# Patient Record
Sex: Male | Born: 1945 | Race: White | Hispanic: Yes | Smoking: Former smoker
Health system: Southern US, Community
[De-identification: ages and names within clinical notes are randomized; demographics above are authoritative.]

## PROBLEM LIST (undated history)

## (undated) ENCOUNTER — Emergency Department (HOSPITAL_COMMUNITY): Admission: EM | Payer: Medicare Other

## (undated) DIAGNOSIS — I340 Nonrheumatic mitral (valve) insufficiency: Secondary | ICD-10-CM

## (undated) DIAGNOSIS — R55 Syncope and collapse: Secondary | ICD-10-CM

## (undated) DIAGNOSIS — I509 Heart failure, unspecified: Secondary | ICD-10-CM

## (undated) DIAGNOSIS — T884XXA Failed or difficult intubation, initial encounter: Secondary | ICD-10-CM

## (undated) DIAGNOSIS — M109 Gout, unspecified: Secondary | ICD-10-CM

## (undated) DIAGNOSIS — I10 Essential (primary) hypertension: Secondary | ICD-10-CM

## (undated) DIAGNOSIS — D471 Chronic myeloproliferative disease: Secondary | ICD-10-CM

## (undated) DIAGNOSIS — F1910 Other psychoactive substance abuse, uncomplicated: Secondary | ICD-10-CM

## (undated) DIAGNOSIS — R0600 Dyspnea, unspecified: Secondary | ICD-10-CM

## (undated) DIAGNOSIS — J449 Chronic obstructive pulmonary disease, unspecified: Secondary | ICD-10-CM

## (undated) DIAGNOSIS — N289 Disorder of kidney and ureter, unspecified: Secondary | ICD-10-CM

## (undated) DIAGNOSIS — J189 Pneumonia, unspecified organism: Secondary | ICD-10-CM

## (undated) DIAGNOSIS — K219 Gastro-esophageal reflux disease without esophagitis: Secondary | ICD-10-CM

## (undated) DIAGNOSIS — D45 Polycythemia vera: Secondary | ICD-10-CM

## (undated) DIAGNOSIS — I429 Cardiomyopathy, unspecified: Secondary | ICD-10-CM

## (undated) DIAGNOSIS — K922 Gastrointestinal hemorrhage, unspecified: Secondary | ICD-10-CM

## (undated) DIAGNOSIS — D72829 Elevated white blood cell count, unspecified: Secondary | ICD-10-CM

## (undated) DIAGNOSIS — I4891 Unspecified atrial fibrillation: Secondary | ICD-10-CM

## (undated) DIAGNOSIS — E669 Obesity, unspecified: Secondary | ICD-10-CM

## (undated) DIAGNOSIS — K759 Inflammatory liver disease, unspecified: Secondary | ICD-10-CM

## (undated) DIAGNOSIS — Z8679 Personal history of other diseases of the circulatory system: Secondary | ICD-10-CM

## (undated) HISTORY — PX: CARDIOVERSION: SHX1299

## (undated) HISTORY — PX: COLONOSCOPY WITH PROPOFOL: SHX5780

## (undated) HISTORY — PX: POLYPECTOMY: SHX5525

## (undated) HISTORY — PX: COLONOSCOPY: SHX174

## (undated) HISTORY — PX: EXCISIONAL HEMORRHOIDECTOMY: SHX1541

## (undated) HISTORY — PX: MULTIPLE EXTRACTIONS WITH ALVEOLOPLASTY: SHX5342

## (undated) HISTORY — PX: CARDIAC ELECTROPHYSIOLOGY MAPPING AND ABLATION: SHX1292

## (undated) HISTORY — DX: Chronic myeloproliferative disease: D47.1

## (undated) HISTORY — PX: MITRAL VALVE REPAIR: CATH118311

## (undated) HISTORY — PX: RIGHT/LEFT HEART CATH AND CORONARY ANGIOGRAPHY: CATH118266

## (undated) HISTORY — PX: CARDIAC SURGERY: SHX584

## (undated) HISTORY — PX: CARDIAC CATHETERIZATION: SHX172

## (undated) HISTORY — PX: CORONARY STENT INTERVENTION: CATH118234

## (undated) HISTORY — PX: RIGHT HEART CATH: CATH118263

## (undated) HISTORY — PX: MULTIPLE TOOTH EXTRACTIONS: SHX2053

## (undated) HISTORY — DX: Essential (primary) hypertension: I10

## (undated) HISTORY — DX: Elevated white blood cell count, unspecified: D72.829

## (undated) HISTORY — PX: TEE WITHOUT CARDIOVERSION: SHX5443

## (undated) HISTORY — DX: Heart failure, unspecified: I50.9

## (undated) HISTORY — DX: Other psychoactive substance abuse, uncomplicated: F19.10

## (undated) HISTORY — PX: LOOP RECORDER IMPLANT: SHX5477

## (undated) HISTORY — DX: Failed or difficult intubation, initial encounter: T88.4XXA

## (undated) HISTORY — DX: Unspecified atrial fibrillation: I48.91

---

## 2010-08-20 HISTORY — PX: CARDIAC ELECTROPHYSIOLOGY MAPPING AND ABLATION: SHX1292

## 2010-09-07 ENCOUNTER — Inpatient Hospital Stay (HOSPITAL_COMMUNITY): Payer: Self-pay

## 2010-09-07 ENCOUNTER — Inpatient Hospital Stay (HOSPITAL_COMMUNITY)
Admission: EM | Admit: 2010-09-07 | Discharge: 2010-09-12 | DRG: 250 | Disposition: A | Discharge status: Home / Self Care | Payer: Self-pay | Attending: Infectious Disease | Admitting: Infectious Disease

## 2010-09-07 ENCOUNTER — Emergency Department (HOSPITAL_COMMUNITY): Payer: Self-pay

## 2010-09-07 DIAGNOSIS — F1010 Alcohol abuse, uncomplicated: Secondary | ICD-10-CM | POA: Diagnosis present

## 2010-09-07 DIAGNOSIS — I5043 Acute on chronic combined systolic (congestive) and diastolic (congestive) heart failure: Secondary | ICD-10-CM | POA: Diagnosis present

## 2010-09-07 DIAGNOSIS — Z7982 Long term (current) use of aspirin: Secondary | ICD-10-CM

## 2010-09-07 DIAGNOSIS — D72829 Elevated white blood cell count, unspecified: Secondary | ICD-10-CM | POA: Diagnosis present

## 2010-09-07 DIAGNOSIS — I428 Other cardiomyopathies: Secondary | ICD-10-CM | POA: Diagnosis present

## 2010-09-07 DIAGNOSIS — R0609 Other forms of dyspnea: Secondary | ICD-10-CM

## 2010-09-07 DIAGNOSIS — R000 Tachycardia, unspecified: Secondary | ICD-10-CM | POA: Diagnosis present

## 2010-09-07 DIAGNOSIS — R7309 Other abnormal glucose: Secondary | ICD-10-CM | POA: Diagnosis present

## 2010-09-07 DIAGNOSIS — R609 Edema, unspecified: Secondary | ICD-10-CM

## 2010-09-07 DIAGNOSIS — R0989 Other specified symptoms and signs involving the circulatory and respiratory systems: Secondary | ICD-10-CM

## 2010-09-07 DIAGNOSIS — I10 Essential (primary) hypertension: Secondary | ICD-10-CM | POA: Diagnosis present

## 2010-09-07 DIAGNOSIS — I509 Heart failure, unspecified: Secondary | ICD-10-CM | POA: Diagnosis present

## 2010-09-07 DIAGNOSIS — E669 Obesity, unspecified: Secondary | ICD-10-CM | POA: Diagnosis present

## 2010-09-07 DIAGNOSIS — F17200 Nicotine dependence, unspecified, uncomplicated: Secondary | ICD-10-CM | POA: Diagnosis present

## 2010-09-07 DIAGNOSIS — R52 Pain, unspecified: Secondary | ICD-10-CM

## 2010-09-07 DIAGNOSIS — I4892 Unspecified atrial flutter: Secondary | ICD-10-CM | POA: Diagnosis present

## 2010-09-07 DIAGNOSIS — I4891 Unspecified atrial fibrillation: Principal | ICD-10-CM | POA: Diagnosis present

## 2010-09-07 LAB — DIFFERENTIAL
Basophils Absolute: 0 10*3/uL (ref 0.0–0.1)
Lymphocytes Relative: 15 % (ref 12–46)
Lymphs Abs: 1.9 10*3/uL (ref 0.7–4.0)
Neutro Abs: 9.6 10*3/uL — ABNORMAL HIGH (ref 1.7–7.7)
Neutrophils Relative %: 76 % (ref 43–77)

## 2010-09-07 LAB — BASIC METABOLIC PANEL
CO2: 24 mEq/L (ref 19–32)
CO2: 21 mEq/L (ref 19–32)
Calcium: 8.7 mg/dL (ref 8.4–10.5)
Calcium: 7.6 mg/dL — ABNORMAL LOW (ref 8.4–10.5)
Creatinine, Ser: 0.99 mg/dL (ref 0.4–1.5)
Creatinine, Ser: 0.8 mg/dL (ref 0.4–1.5)
GFR calc Af Amer: >60 mL/min (ref >60)
GFR calc Af Amer: >60 mL/min (ref >60)
GFR calc non Af Amer: >60 mL/min (ref >60)
GFR calc non Af Amer: >60 mL/min (ref >60)
Glucose, Bld: 220 mg/dL — ABNORMAL HIGH (ref 70–99)
Sodium: 141 mEq/L (ref 135–145)
Sodium: 134 mEq/L — ABNORMAL LOW (ref 135–145)

## 2010-09-07 LAB — POCT CARDIAC MARKERS
CKMB, poc: <1 ng/mL — ABNORMAL LOW (ref 1.0–8.0)
Myoglobin, poc: 46.5 ng/mL (ref 12–200)
Troponin i, poc: <0.05 ng/mL (ref 0.00–0.09)

## 2010-09-07 LAB — ETHANOL: Alcohol, Ethyl (B): <5 mg/dL (ref 0–10)

## 2010-09-07 LAB — CARDIAC PANEL(CRET KIN+CKTOT+MB+TROPI)
Relative Index: INVALID (ref 0.0–2.5)
Total CK: 33 U/L (ref 7–232)
Troponin I: 0.04 ng/mL (ref 0.00–0.06)

## 2010-09-07 LAB — CBC
HCT: 55.5 % — ABNORMAL HIGH (ref 39.0–52.0)
Hemoglobin: 19.6 g/dL — ABNORMAL HIGH (ref 13.0–17.0)
MCV: 100 fL (ref 78.0–100.0)
RBC: 5.55 MIL/uL (ref 4.22–5.81)
RDW: 14.6 % (ref 11.5–15.5)
WBC: 12.7 10*3/uL — ABNORMAL HIGH (ref 4.0–10.5)

## 2010-09-07 LAB — URINALYSIS, ROUTINE W REFLEX MICROSCOPIC
Hgb urine dipstick: NEGATIVE
Specific Gravity, Urine: 1.031 — ABNORMAL HIGH (ref 1.005–1.030)
Urine Glucose, Fasting: 100 mg/dL — AB

## 2010-09-07 LAB — CK TOTAL AND CKMB (NOT AT ARMC)
CK, MB: 1.5 ng/mL (ref 0.3–4.0)
Total CK: 41 U/L (ref 7–232)

## 2010-09-07 LAB — GLUCOSE, CAPILLARY
Glucose-Capillary: 100 mg/dL — ABNORMAL HIGH (ref 70–99)
Glucose-Capillary: 85 mg/dL (ref 70–99)

## 2010-09-07 LAB — HEPATIC FUNCTION PANEL
Albumin: 2.8 g/dL — ABNORMAL LOW (ref 3.5–5.2)
Alkaline Phosphatase: 71 U/L (ref 39–117)
Bilirubin, Direct: 0.3 mg/dL (ref 0.0–0.3)
Indirect Bilirubin: 0.6 mg/dL (ref 0.3–0.9)
Total Bilirubin: 0.9 mg/dL (ref 0.3–1.2)

## 2010-09-07 LAB — COMPREHENSIVE METABOLIC PANEL
ALT: 48 U/L (ref 0–53)
Alkaline Phosphatase: 81 U/L (ref 39–117)
BUN: 11 mg/dL (ref 6–23)
CO2: 24 mEq/L (ref 19–32)
Chloride: 109 mEq/L (ref 96–112)
GFR calc non Af Amer: >60 mL/min (ref >60)
Glucose, Bld: 109 mg/dL — ABNORMAL HIGH (ref 70–99)
Potassium: 3.8 mEq/L (ref 3.5–5.1)
Sodium: 139 mEq/L (ref 135–145)
Total Bilirubin: 0.8 mg/dL (ref 0.3–1.2)

## 2010-09-07 LAB — MAGNESIUM: Magnesium: 1.8 mg/dL (ref 1.5–2.5)

## 2010-09-07 LAB — RAPID URINE DRUG SCREEN, HOSP PERFORMED
Amphetamines: NOT DETECTED
Barbiturates: NOT DETECTED
Benzodiazepines: NOT DETECTED
Opiates: POSITIVE — AB
Tetrahydrocannabinol: NOT DETECTED

## 2010-09-07 LAB — HEMOGLOBIN A1C: Mean Plasma Glucose: 120 mg/dL — ABNORMAL HIGH (ref <117)

## 2010-09-07 LAB — HIV ANTIBODY (ROUTINE TESTING W REFLEX): HIV: NONREACTIVE

## 2010-09-08 DIAGNOSIS — I4892 Unspecified atrial flutter: Secondary | ICD-10-CM

## 2010-09-08 DIAGNOSIS — R609 Edema, unspecified: Secondary | ICD-10-CM

## 2010-09-08 DIAGNOSIS — R0989 Other specified symptoms and signs involving the circulatory and respiratory systems: Secondary | ICD-10-CM

## 2010-09-08 DIAGNOSIS — R0609 Other forms of dyspnea: Secondary | ICD-10-CM

## 2010-09-08 LAB — HEPATITIS PANEL, ACUTE
Hep B C IgM: NEGATIVE
Hepatitis B Surface Ag: NEGATIVE

## 2010-09-08 LAB — BASIC METABOLIC PANEL
BUN: 9 mg/dL (ref 6–23)
Calcium: 7.8 mg/dL — ABNORMAL LOW (ref 8.4–10.5)
Chloride: 109 mEq/L (ref 96–112)
Creatinine, Ser: 0.71 mg/dL (ref 0.4–1.5)
GFR calc Af Amer: >60 mL/min (ref >60)

## 2010-09-08 LAB — CARDIAC PANEL(CRET KIN+CKTOT+MB+TROPI)
Relative Index: INVALID (ref 0.0–2.5)
Total CK: 40 U/L (ref 7–232)
Troponin I: 0.05 ng/mL (ref 0.00–0.06)

## 2010-09-08 LAB — GLUCOSE, CAPILLARY
Glucose-Capillary: 116 mg/dL — ABNORMAL HIGH (ref 70–99)
Glucose-Capillary: 111 mg/dL — ABNORMAL HIGH (ref 70–99)
Glucose-Capillary: 143 mg/dL — ABNORMAL HIGH (ref 70–99)

## 2010-09-08 LAB — HEPARIN LEVEL (UNFRACTIONATED): Heparin Unfractionated: 0.37 IU/mL (ref 0.30–0.70)

## 2010-09-08 LAB — CBC
MCH: 34.4 pg — ABNORMAL HIGH (ref 26.0–34.0)
MCHC: 33.9 g/dL (ref 30.0–36.0)
MCV: 101.6 fL — ABNORMAL HIGH (ref 78.0–100.0)
Platelets: 258 10*3/uL (ref 150–400)
RBC: 5 MIL/uL (ref 4.22–5.81)
RDW: 15 % (ref 11.5–15.5)

## 2010-09-08 LAB — LIPID PANEL
Cholesterol: 143 mg/dL (ref 0–200)
LDL Cholesterol: 98 mg/dL (ref 0–99)
Total CHOL/HDL Ratio: 4.2 RATIO

## 2010-09-09 DIAGNOSIS — R0989 Other specified symptoms and signs involving the circulatory and respiratory systems: Secondary | ICD-10-CM

## 2010-09-09 DIAGNOSIS — R0609 Other forms of dyspnea: Secondary | ICD-10-CM

## 2010-09-09 DIAGNOSIS — R609 Edema, unspecified: Secondary | ICD-10-CM

## 2010-09-09 DIAGNOSIS — I4891 Unspecified atrial fibrillation: Secondary | ICD-10-CM

## 2010-09-09 LAB — GLUCOSE, CAPILLARY
Glucose-Capillary: 135 mg/dL — ABNORMAL HIGH (ref 70–99)
Glucose-Capillary: 104 mg/dL — ABNORMAL HIGH (ref 70–99)
Glucose-Capillary: 136 mg/dL — ABNORMAL HIGH (ref 70–99)

## 2010-09-09 LAB — BASIC METABOLIC PANEL
BUN: 18 mg/dL (ref 6–23)
Calcium: 8.5 mg/dL (ref 8.4–10.5)
Creatinine, Ser: 1.08 mg/dL (ref 0.4–1.5)
GFR calc Af Amer: >60 mL/min (ref >60)
GFR calc non Af Amer: >60 mL/min (ref >60)

## 2010-09-09 LAB — CBC
MCH: 34.1 pg — ABNORMAL HIGH (ref 26.0–34.0)
MCHC: 33.6 g/dL (ref 30.0–36.0)
Platelets: 229 10*3/uL (ref 150–400)
RBC: 4.78 MIL/uL (ref 4.22–5.81)
WBC: 12 10*3/uL — ABNORMAL HIGH (ref 4.0–10.5)

## 2010-09-09 LAB — HEPARIN LEVEL (UNFRACTIONATED): Heparin Unfractionated: 0.12 IU/mL — ABNORMAL LOW (ref 0.30–0.70)

## 2010-09-10 LAB — CBC
HCT: 47.3 % (ref 39.0–52.0)
Hemoglobin: 15.8 g/dL (ref 13.0–17.0)
MCHC: 33.4 g/dL (ref 30.0–36.0)
WBC: 8.8 10*3/uL (ref 4.0–10.5)

## 2010-09-10 LAB — BASIC METABOLIC PANEL
CO2: 30 mEq/L (ref 19–32)
Calcium: 8.3 mg/dL — ABNORMAL LOW (ref 8.4–10.5)
GFR calc Af Amer: >60 mL/min (ref >60)
GFR calc non Af Amer: >60 mL/min (ref >60)
Potassium: 3.8 mEq/L (ref 3.5–5.1)
Sodium: 142 mEq/L (ref 135–145)

## 2010-09-10 LAB — GLUCOSE, CAPILLARY: Glucose-Capillary: 130 mg/dL — ABNORMAL HIGH (ref 70–99)

## 2010-09-11 LAB — GLUCOSE, CAPILLARY
Glucose-Capillary: 104 mg/dL — ABNORMAL HIGH (ref 70–99)
Glucose-Capillary: 101 mg/dL — ABNORMAL HIGH (ref 70–99)
Glucose-Capillary: 169 mg/dL — ABNORMAL HIGH (ref 70–99)

## 2010-09-11 LAB — BASIC METABOLIC PANEL
BUN: 15 mg/dL (ref 6–23)
CO2: 29 mEq/L (ref 19–32)
Glucose, Bld: 102 mg/dL — ABNORMAL HIGH (ref 70–99)
Potassium: 4 mEq/L (ref 3.5–5.1)
Sodium: 139 mEq/L (ref 135–145)

## 2010-09-11 LAB — CBC
HCT: 48.4 % (ref 39.0–52.0)
Hemoglobin: 16.4 g/dL (ref 13.0–17.0)
MCH: 34.2 pg — ABNORMAL HIGH (ref 26.0–34.0)
MCHC: 33.9 g/dL (ref 30.0–36.0)
MCV: 100.8 fL — ABNORMAL HIGH (ref 78.0–100.0)

## 2010-09-12 DIAGNOSIS — R609 Edema, unspecified: Secondary | ICD-10-CM

## 2010-09-12 DIAGNOSIS — R0609 Other forms of dyspnea: Secondary | ICD-10-CM

## 2010-09-12 DIAGNOSIS — R0989 Other specified symptoms and signs involving the circulatory and respiratory systems: Secondary | ICD-10-CM

## 2010-09-12 LAB — CBC
HCT: 46.6 % (ref 39.0–52.0)
Hemoglobin: 15.6 g/dL (ref 13.0–17.0)
MCH: 33.7 pg (ref 26.0–34.0)
MCV: 100.6 fL — ABNORMAL HIGH (ref 78.0–100.0)
RBC: 4.63 MIL/uL (ref 4.22–5.81)

## 2010-09-12 LAB — BASIC METABOLIC PANEL
CO2: 32 mEq/L (ref 19–32)
Chloride: 99 mEq/L (ref 96–112)
Creatinine, Ser: 1.17 mg/dL (ref 0.4–1.5)
GFR calc Af Amer: >60 mL/min (ref >60)
Glucose, Bld: 92 mg/dL (ref 70–99)

## 2010-09-12 LAB — GLUCOSE, CAPILLARY

## 2010-09-25 NOTE — Discharge Summary (Signed)
[name], [name] NO.:  [id number]  MEDICAL RECORD NO.:  [id number]           PATIENT TYPE:  I  LOCATION:  2010                         FACILITY:  [facility]  PHYSICIAN:  Acey Lav, MD  DATE OF BIRTH:  1945-09-02  DATE OF ADMISSION:  09/07/2010 DATE OF DISCHARGE:  09/12/2010                              DISCHARGE SUMMARY   DISCHARGE DIAGNOSES: 1. Recurrent atrial flutter/atrial fibrillation status post ablation     on September 11, 2010. 2. 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 3. History of tobacco abuse, quit a week ago. 4. Alcohol abuse. 5. Leucocytosis from stress demargination. 6. Polycythemia 7. Hypertension.  DISCHARGE MEDICATIONS WITH ACCURATE DOSES: 1. Amiodarone 200 mg, take 1 tablet by mouth twice daily for 2 weeks. 2. Pradaxa 150 mg, take 1 tablet by mouth twice daily. 3. Multivitamins, take 1 tablet by mouth once daily. 4. Coreg 12.5 mg, take 1 tablet by mouth twice daily. 5. Lisinopril 5 mg, take 1 tablet by mouth daily. 6. Furosemide 40 mg, take 1 tablet by mouth daily. He was given some samples of pradaxa by [name] and he qualified for getting free pradaxa supply for 1 month at the time of discaharge.  DISPOSITION AND FOLLOWUP:  The patient was discharged home in stable and improved condition from [facility] on September 12, 2010.  The patient has been scheduled to follow up with [name] on September 23, 2010.  The patient has also been scheduled to follow up with Dr. Threasa Beards on September 30, 2010, at 9:15 a.m.  At that time, he needs to be counseled on alcohol cessation.  His medication compliance needs to be reassesed during  that visit.  We also need to consult Jaynee Eagles to see if the patient qualifies for the [facility] Card because of financial difficulties.[name]  PROCEDURES PERFORMED: 1. Chest x-ray on September 07, 2010, which showed  streaky left     perihilar opacity, could represent prominent vessels given the area     of mild vascular congestion, but further evaluation is recommended     with PA and lateral chest x-ray. 2. Portable chest x-ray on September 07, 2010, which showed     cardiomegaly and probable pulmonary vascular congestion.  No focal     infiltrates. 3. Ultrasound aorta, which showed normal examination of aorta.  No     evidence of aneurysm.  Small right renal cysts. 4. Echocardiogram on September 07, 2010, which showed diffuse     hypokinesis and EF of 30-35%. 5. TEE on September 08, 2010, which was negative for left atrial     thrombus. 6. Unsuccessful DC cardioversion with return of early atrial flutter     on September 08, 2010. 7. Radiofrequency ablation on September 11, 2010.  ADMITTING HISTORY AND PHYSICAL:  A 65 year old man with no significant past medical history except for alcohol abuse who comes to the ER with chief complaint of worsening shortness of breath and leg swelling for 1 week.  The patient reports that he noticed getting short of  breath mostly with exertion for several weeks to months, but was worse for the past 1 week.  He denied any PND or orthopnea.  The patient also states that he noticed bilateral leg swelling for last 1 week, which was not getting better.  He denied any aggravating or relieving factors and denies any association with pain or redness.  The patient also denied any associated trauma, insect bites.  He also denied any chest pain, palpitations, dizziness, cough, fever, nausea, vomiting, diarrhea, dysuria, urgency, frequency.  In the ER, the patient was found to be in atrial flutter.  He was treated first with adenosine and then was started on Cardizem drip.  PHYSICAL EXAMINATION:  VITAL SIGNS:  Temperature 98.3, blood pressure 166/129, heart rate of 161, respiratory rate to 20, O2 sats 97% on room air. GENERAL:  Alert and oriented x3, in no acute distress,  well developed. HEENT:  Pupils equal, round, reactive to light.  Extraocular movements intact. NECK:  Supple.  No JVD, full range of motion. CVS:  Tachycardic.  No chest wall tenderness, no murmurs, rubs or gallops. GI:  Soft, nontender.  Positive bowel sounds.  No hepatosplenomegaly. EXTREMITIES:  2+/4 nonpitting edema to ankles bilaterally. LYMPHATICS:  No lymphadenopathy. MUSCULOSKELETAL:  Moving all 4 extremities. NEUROLOGIC:  Alert and oriented x3.  Cranial nerves II through XII intact.  Motor strength 5/5 bilaterally in all 4 extremities.  Sensation is intact to light touch.  LABORATORIES ON ADMISSION:  Sodium 141, potassium 3.9, chloride 106, bicarb 24, BUN 12, creatinine 0.99, glucose of 220.  White cell count 12.7, hemoglobin 19.6, hematocrit 55, platelets of 259.  BNP 177.  HOSPITAL COURSE BY PROBLEM: 1. Recurrent atrial flutter/atrial fibrillation.  The patient     presented with bilateral lower extremity swelling for 2 weeks.  He     was found to be in atrial flutter in the ER and was started on     Cardizem drip.  Cardiology was consulted and they recommended     anticoagulation with full dose of heparin given his CHADS2 score of     2 with congestive heart failure and hypertension.  The patient was     continued on the Cardizem drip but did not show any significant     improvement in his symptoms and continued to be in atrial flutter.     The next day, the patient was attempted DC cardioversion after he was     ruled out for atrial thrombus by TEE, but unfortunately the patient had early     return of atrial flutter with fibrillation.  The patient was observed for      one more day and underwent radiofrequency ablation of his atrial flutter     on Feb 23rd.  The patient was also started on amiodarone because he     was also noticed to have atrial fibrillation alternating with     atrial flutter.  The patient was discharged home on amiodarone and     Pradaxa a close  followup with cardiologist, [name].  The     patient was given samples of Pradaxa at that time of discharge. 2. Cardiomyopathy.  The patient had nonischemic cardiomyopathy, likely     related to alcohol versus tachycardia induced.  His ejection     fraction was 30-35% per echo on September 07, 2010.  The patient was     started on Lasix, Coreg and lisinopril, which he tolerated well. 3. Leukocytosis.  The patient when presented,  had an elevated white     count which was thought likely to be secondary to stress     demargination.  He was ruled for any focus of infection with     negative chest x-ray and clear urinalysis. 4. Polycythemia.  The patient had reactive polycythemia, most likely     secondary to tobacco abuse.  His H and H was stable throughout his     hospital stay. 5. Alcohol.  The patient had a formal Child psychotherapist consult for     alcohol cessation.  The patient was encouraged not to drink and was     explained that most likely his atrial flutter was secondary to     alcohol abuse.  DISCHARGE LABORATORIES AND VITALS:  His discharge vitals include temperature 97.5, pulse 72, respiratory rate 20, blood pressure 136/93, O2 sats 94% on room air.  His discharge labs; white cell count 10.7, hemoglobin 15.6, hematocrit 46.6, and platelets of 214.  His sodium was 138, potassium 3.7, chloride 99, bicarb 32, glucose 92, BUN 17, creatinine 1.17.    ______________________________ Elyse Jarvis, MD   ______________________________ Acey Lav, MD    MS/MEDQ  D:  09/15/2010  T:  09/16/2010  Job:  [id number]  cc:   Jackson Latino, MD Georga Hacking, M.D.  Electronically Signed by Elyse Jarvis MD on 09/18/2010 09:20:44 AM Electronically Signed by Paulette Blanch DAM MD on 09/24/2010 12:04:40 PM

## 2010-09-30 ENCOUNTER — Ambulatory Visit (INDEPENDENT_AMBULATORY_CARE_PROVIDER_SITE_OTHER): Payer: Self-pay | Admitting: Internal Medicine

## 2010-09-30 ENCOUNTER — Ambulatory Visit (HOSPITAL_COMMUNITY)
Admission: RE | Admit: 2010-09-30 | Discharge: 2010-09-30 | Disposition: A | Discharge status: Home / Self Care | Payer: Self-pay | Source: Ambulatory Visit | Attending: Internal Medicine | Admitting: Internal Medicine

## 2010-09-30 ENCOUNTER — Encounter: Payer: Self-pay | Admitting: Internal Medicine

## 2010-09-30 VITALS — BP 97/66 | HR 56 | Temp 97.0°F | Ht 66.0 in | Wt 199.2 lb

## 2010-09-30 DIAGNOSIS — I517 Cardiomegaly: Secondary | ICD-10-CM | POA: Insufficient documentation

## 2010-09-30 DIAGNOSIS — I10 Essential (primary) hypertension: Secondary | ICD-10-CM | POA: Insufficient documentation

## 2010-09-30 DIAGNOSIS — F1010 Alcohol abuse, uncomplicated: Secondary | ICD-10-CM

## 2010-09-30 DIAGNOSIS — F1020 Alcohol dependence, uncomplicated: Secondary | ICD-10-CM | POA: Insufficient documentation

## 2010-09-30 DIAGNOSIS — I498 Other specified cardiac arrhythmias: Secondary | ICD-10-CM | POA: Insufficient documentation

## 2010-09-30 DIAGNOSIS — I4891 Unspecified atrial fibrillation: Secondary | ICD-10-CM | POA: Insufficient documentation

## 2010-09-30 DIAGNOSIS — I426 Alcoholic cardiomyopathy: Secondary | ICD-10-CM

## 2010-09-30 DIAGNOSIS — R9431 Abnormal electrocardiogram [ECG] [EKG]: Secondary | ICD-10-CM | POA: Insufficient documentation

## 2010-09-30 DIAGNOSIS — IMO0002 Reserved for concepts with insufficient information to code with codable children: Secondary | ICD-10-CM

## 2010-09-30 DIAGNOSIS — I252 Old myocardial infarction: Secondary | ICD-10-CM | POA: Insufficient documentation

## 2010-09-30 NOTE — Assessment & Plan Note (Signed)
Lab Results  Component Value Date   NA 138 09/12/2010   K 3.7 09/12/2010   CL 99 09/12/2010   CO2 32 09/12/2010   BUN 17 09/12/2010   CREATININE 1.17 09/12/2010    BP Readings from Last 3 Encounters:  09/30/10 97/66    BP well controlled and at goal, no dizziness or CP, will continue current meds and check vitals at next visit.

## 2010-09-30 NOTE — Progress Notes (Signed)
  Subjective:    Patient ID: [name], male    DOB: 01-24-1946, 65 y.o.   MRN: [id number]  HPI Pt is a 65 yo male with PMH of ETOH abuse, HTN, CHF with EF 30-35% who came here for recent hospital discharge f/u for his A. Flutter with A. Fib s/p ablation and conversion. He was admitted to hospital 09/07/10 - 09/12/10 and has been doing well after discharge and last saw his cardiologist [name] on 09/23/2010 and he changed amiodarone to 200mg  daily from bid. He has no palpitation, CP, abdominal pain, melena or fever except mild SOB which is chronic. No smoking, alcohol or drug abuse after recent discharge.     Review of Systems Per HPI.  Current Outpatient Prescriptions  Medication Sig Dispense Refill  . carvedilol (COREG) 12.5 MG tablet Take 12.5 mg by mouth 2 (two) times daily with a meal.        . dabigatran (PRADAXA) 150 MG CAPS Take 150 mg by mouth every 12 (twelve) hours.        . furosemide (LASIX) 40 MG tablet Take 40 mg by mouth daily.        [name] lisinopril (PRINIVIL,ZESTRIL) 5 MG tablet Take 5 mg by mouth daily.        . Multiple Vitamin (MULTIVITAMIN) tablet Take 1 tablet by mouth daily.          Allergies Codeine  Past Medical History  Diagnosis Date  . Cardiomyopathy   . Substance abuse     alcohol  . Hypertension   . Atrial flutter      status post ablation on 09/11/2010  . CHF (congestive heart failure)     EF 35% with diffuse hypokinesis    History reviewed. No pertinent past surgical history.     Objective:   Physical Exam General: Vital signs reviewed and noted. Well-developed,well-nourished,in no acute distress; alert,appropriate and cooperative throughout examination. Head: normocephalic, atraumatic. Neck: No deformities, masses, or tenderness noted. Lungs: Normal respiratory effort. Clear to auscultation BL without crackles or wheezes.  Heart: RRR. S1 and S2 normal without gallop, murmur, or rubs.  Abdomen: BS normoactive. Soft, Nondistended,  non-tender.  No masses or organomegaly. Extremities: No pretibial edema.         Assessment & Plan:

## 2010-09-30 NOTE — Patient Instructions (Addendum)
Please call the Clinic or [name] office if you have palpitation, irregular heart rhythm or chest pain.  Please do not drink any alcohol again.  Please go to see Ms. Jaynee Eagles for orange card and any finacial help.

## 2010-09-30 NOTE — Assessment & Plan Note (Addendum)
Since discharge, he has never drink, also provided counseling for the risk of ETOH to his health including liver, heart damage, he fully understands and said will not drink again.

## 2010-09-30 NOTE — Assessment & Plan Note (Addendum)
His recent EF 30-35% with diffuse hypokinesis, likely due to ETOH abuse. Will continue lasix, lisinopril, coreg and anticoagulation. He will f/u by [name] next month.

## 2010-09-30 NOTE — Assessment & Plan Note (Addendum)
He has A. Flutter with A. Fib s/p ablation and conversion, which is likely due to ETOH abuse-associated cardiomyopathy,  now on coreg , lisinopril, pradax and amiodarone, his HR regular and EGK shows NSR.  Will continue current meds and he will stop amiodarone after he completes  his remaining amiodarone pills.  Asked him to call us or [name] office if palpitation or CP happens. He has stopped ETOH.

## 2010-10-01 NOTE — Consult Note (Signed)
NAMEPIERCEN, COVINO NO.:  [id number]  MEDICAL RECORD NO.:  [id number]           PATIENT TYPE:  I  LOCATION:  2919                         FACILITY:  [facility]  PHYSICIAN:  Doylene Canning. Ladona Ridgel, MD    DATE OF BIRTH:  08-25-1945  DATE OF CONSULTATION:  09/08/2010 DATE OF DISCHARGE:                                CONSULTATION   REQUESTING PHYSICIAN:  Georga Hacking, MD  INDICATION FOR CONSULTATION:  Evaluation of atrial flutter with a rapid ventricular response.  HISTORY OF PRESENT ILLNESS:  The patient is a 65 year old man who is admitted to the hospital with worsening heart failure symptoms and found to be in atrial flutter with a rapid ventricular response.  He has had difficult to control atrial flutter and has required multiple AV nodal blocking drugs.  The patient underwent DC cardioversion today x3 and had early return of atrial flutter.  Since then, he also has very clearly gone into atrial fibrillation.  He is referred now for additional evaluation.  The patient denies a history of syncope.  He denies chest pain.  He does have dyspnea with exertion.  PAST MEDICAL HISTORY:  Notable for LV dysfunction with an EF of 35 or 40%.  He also has a history of peripheral edema.  He denies chest pain or shortness of breath.  He does not feel palpitations when he is in atrial fibrillation.  FAMILY HISTORY:  Negative for premature coronary artery disease.  SOCIAL HISTORY:  The patient previously lived in [place] where he was a native.  He moved to [place] over 50 years ago and then has moved to [place] approximately 8 years ago.  He admits to history of alcohol abuse, drinking a fifth of alcohol every other day.  He smokes a pack of cigarettes a day and he has done so for 40 years.  He denies other substance abuse.  REVIEW OF SYSTEMS:  All systems were reviewed and negative except as noted in the HPI.  PHYSICAL EXAMINATION:  GENERAL:  He is a  pleasant, well-appearing middle- aged man, in no distress. VITAL SIGNS:  Blood pressure was 115/73, the pulse was 110 and irregular, the respirations were 18 and temperature was 98.  HEENT: Normocephalic and atraumatic.  Pupils equal and round.  Oropharynx is moist.  Sclerae anicteric. NECK:  No jugular distention.  There is no thyromegaly.  Trachea is midline.  The carotid are 2+ and symmetric. LUNGS:  Clear bilaterally to auscultation.  There are no wheezes, rales or rhonchi. ABDOMEN:  Soft, nontender and nondistended.  There is no organomegaly. EXTREMITIES:  Demonstrated no cyanosis, clubbing or edema.  The pulses were 2+ and symmetric. SKIN:  Demonstrated multiple healing excoriations particularly on the legs. NEUROLOGIC:  Alert and oriented x3.  Cranial nerves intact.  The EKG previously demonstrates atrial flutter.  His current telemetry strips demonstrate clear-cut atrial fib with an irregularly irregular ventricular response.  IMPRESSION: 1. Atrial flutter with a rapid ventricular response and status post     direct current cardioversion with early return of atrial flutter. 2. Clear-cut atrial fibrillation. 3. Dyslipidemia.  DISCUSSION:  I have discussed  treatment option with the patient.  He has LV dysfunction.  I suspect related to a tachycardia-induced cardiomyopathy.  He needs sinus rhythm.  Initially, I thought catheter ablation would be reasonable, but because he clearly has atrial fibrillation to go along with his atrial flutter, I think amiodarone initially would be appropriate.  If the patient's arrhythmias are controlled on amiodarone and he will continue these.  If his atrial flutter persists despite amiodarone and his fibrillation remains quiet, then catheter ablation would be an option.  We will follow the patient back up in several weeks.     Doylene Canning. Ladona Ridgel, MD     GWT/MEDQ  D:  09/08/2010  T:  09/09/2010  Job:  [id number]  cc:   Georga Hacking,  M.D.  Electronically Signed by Lewayne Bunting MD on 10/01/2010 04:37:20 PM
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
## 2010-10-09 NOTE — Op Note (Signed)
[name], DOO NO.:  [id number]  MEDICAL RECORD NO.:  [id number]           PATIENT TYPE:  LOCATION:                                 FACILITY:  PHYSICIAN:  Duke Salvia, MD, FACCDATE OF BIRTH:  [birth year]  DATE OF PROCEDURE:  09/11/2010 DATE OF DISCHARGE:                              OPERATIVE REPORT   PREOPERATIVE DIAGNOSIS:  Atrial flutter.  POSTOPERATIVE DIAGNOSIS:  Atrial fibrillation.  PROCEDURE:  Invasive electrophysiological study, arrhythmia mapping, catheter ablation, cardioversion.  Following obtaining informed consent, the patient was brought to the electrophysiology laboratory and placed on the fluoroscopic table in supine position.  After routine prep and drape, cardiac catheterization was performed with local anesthesia and conscious sedation.  We actually ended up using CPAP given the patient's somewhat apneic breathing patterns during mild sedation.  Following the procedure, the catheters removed.  The patient was transferred to the holding area for sheath removal.  CATHETER: 1. A 5-French quadripolar catheter was inserted via the femoral vein     to the AV junction. 2. A 6-French octapolar catheter was inserted via the right femoral     vein to the coronary sinus. 3. A 7-French dual decapolar catheter was inserted via the left     femoral vein to the tricuspid annulus. 4. A 8-French 8-mm deflectable tip ablation catheter was inserted via     the right femoral vein to mapping sites in the posterior septal     space.  Surface leads I, aVF, and V1 were monitored continuously throughout the procedure.  Following insertion of the catheters, the stimulation protocol included incremental atrial pacing.  RESULTS:  End-tidal surface cardiogram at basic intervals: Initial rhythm:  Atrial flutter; with atrial site AA interval of 230 msec; RR interval 674 msec; PR interval N/A; QRS duration 97 msec; QT interval N/M; AH N/A; HV  interval 40 msec. Final rhythm:  Atrial fibrillation:  RR interval 955 msec; QRS duration 95 msec; HV interval 38 msec.  AV nodal conduction and accessory pathway function could not be assessed as the patient went into atrial fibrillation and following cardioversion immediately reverted to atrial fibrillation.  We can only say that his AV nodal Wenckebach cycle length is below 600 msec.  A total of 3 minutes and 47 seconds of radiofrequency energy was applied across the cavotricuspid isthmus resulting initially in termination of the tachycardia and then bidirectional isthmus block.  It was during the last application of energy with an A1 and A2 interval of 148 msec that the patient went into atrial fibrillation from which he was not cardioverted.  We then measured a CVP and this was greater than 25.  The patient was initially cardioverted from his atrial fibrillation.  He immediately returned to atrial fibrillation.  I was going to give him ibutilide.  However, on changing sheaths we measured his CVP, this was greater than 25 cm of water.  It was elected at this point to reverse his sedation, allow him to wake up with plan for diuresis.  Upon arousal, the patient had no complaints of chest pain or shortness of breath.  Fluoroscopy time, total of 5  minutes of fluoroscopy time was utilized at 7.5 frames per second.  IMPRESSION: 1. Sinus function, not assessed. 2. Atrial function notable for sustained atrial flutter, previously     identified as counterclockwise, today evidence of clockwise     flutter, successful ablation across the cavotricuspid isthmus     followed by atrial fibrillation which could not be cardioverted. 3. Atrioventricular nodal function sufficient to allow 2:1 conduction     of his atrioventricular node at 150 and apparently higher, 4. Normal HV interval. 5. Accessory pathway not assessed. 6. Ventricular response to programed stimulation - none  assessed.  SUMMARY CONCLUSION:  The results of electrophysiological testing confirmed cavotricuspid isthmus-dependent flutter.  It had converted from counterclockwise to clockwise.  The catheter ablation across the isthmus successfully terminated the flutter and resulted in bidirectional isthmus block.  The patient then developed atrial fibrillation as noted.  Repeat cardioversion was anticipated following the infusion of ibutilide.  However, it was elected not to do this because of the patient's very high CVP.  We will plan to diurese the patient.  Repeat cardioversion will be deferred to [name].     Duke Salvia, MD, [facility]     SCK/MEDQ  D:  09/11/2010  T:  09/11/2010  Job:  [id number]  Electronically Signed by Sherryl Manges MD [facility] on 10/09/2010 08:33:38 AM

## 2010-10-29 ENCOUNTER — Ambulatory Visit (HOSPITAL_COMMUNITY)
Admission: RE | Admit: 2010-10-29 | Discharge: 2010-10-29 | Disposition: A | Discharge status: Home / Self Care | Payer: Self-pay | Source: Ambulatory Visit | Attending: Cardiology | Admitting: Cardiology

## 2010-10-29 ENCOUNTER — Encounter: Payer: Self-pay | Admitting: Internal Medicine

## 2010-11-19 ENCOUNTER — Encounter: Payer: Self-pay | Admitting: Internal Medicine

## 2010-12-25 ENCOUNTER — Ambulatory Visit (HOSPITAL_COMMUNITY)
Admission: RE | Admit: 2010-12-25 | Discharge: 2010-12-25 | Disposition: A | Discharge status: Home / Self Care | Payer: Self-pay | Source: Ambulatory Visit | Attending: Cardiology | Admitting: Cardiology

## 2010-12-25 DIAGNOSIS — I359 Nonrheumatic aortic valve disorder, unspecified: Secondary | ICD-10-CM | POA: Insufficient documentation

## 2010-12-25 DIAGNOSIS — F17200 Nicotine dependence, unspecified, uncomplicated: Secondary | ICD-10-CM | POA: Insufficient documentation

## 2010-12-25 DIAGNOSIS — I4891 Unspecified atrial fibrillation: Secondary | ICD-10-CM | POA: Insufficient documentation

## 2010-12-25 DIAGNOSIS — I379 Nonrheumatic pulmonary valve disorder, unspecified: Secondary | ICD-10-CM | POA: Insufficient documentation

## 2010-12-25 DIAGNOSIS — I079 Rheumatic tricuspid valve disease, unspecified: Secondary | ICD-10-CM | POA: Insufficient documentation

## 2012-08-30 ENCOUNTER — Encounter (HOSPITAL_COMMUNITY): Payer: Self-pay | Admitting: *Deleted

## 2012-08-30 ENCOUNTER — Other Ambulatory Visit: Payer: Self-pay

## 2012-08-30 ENCOUNTER — Emergency Department (HOSPITAL_COMMUNITY): Payer: Medicare Other

## 2012-08-30 ENCOUNTER — Inpatient Hospital Stay (HOSPITAL_COMMUNITY)
Admission: EM | Admit: 2012-08-30 | Discharge: 2012-09-01 | DRG: 308 | Disposition: A | Discharge status: Home / Self Care | Payer: Medicare Other | Attending: Internal Medicine | Admitting: Internal Medicine

## 2012-08-30 DIAGNOSIS — Z91199 Patient's noncompliance with other medical treatment and regimen due to unspecified reason: Secondary | ICD-10-CM

## 2012-08-30 DIAGNOSIS — Z87891 Personal history of nicotine dependence: Secondary | ICD-10-CM

## 2012-08-30 DIAGNOSIS — Z9119 Patient's noncompliance with other medical treatment and regimen: Secondary | ICD-10-CM

## 2012-08-30 DIAGNOSIS — D72829 Elevated white blood cell count, unspecified: Secondary | ICD-10-CM

## 2012-08-30 DIAGNOSIS — Z8249 Family history of ischemic heart disease and other diseases of the circulatory system: Secondary | ICD-10-CM

## 2012-08-30 DIAGNOSIS — F1010 Alcohol abuse, uncomplicated: Secondary | ICD-10-CM | POA: Diagnosis present

## 2012-08-30 DIAGNOSIS — Z79899 Other long term (current) drug therapy: Secondary | ICD-10-CM

## 2012-08-30 DIAGNOSIS — I5040 Unspecified combined systolic (congestive) and diastolic (congestive) heart failure: Secondary | ICD-10-CM | POA: Diagnosis present

## 2012-08-30 DIAGNOSIS — D45 Polycythemia vera: Secondary | ICD-10-CM | POA: Diagnosis present

## 2012-08-30 DIAGNOSIS — Z23 Encounter for immunization: Secondary | ICD-10-CM

## 2012-08-30 DIAGNOSIS — I5043 Acute on chronic combined systolic (congestive) and diastolic (congestive) heart failure: Secondary | ICD-10-CM | POA: Diagnosis present

## 2012-08-30 DIAGNOSIS — Z6379 Other stressful life events affecting family and household: Secondary | ICD-10-CM

## 2012-08-30 DIAGNOSIS — I10 Essential (primary) hypertension: Secondary | ICD-10-CM | POA: Diagnosis present

## 2012-08-30 DIAGNOSIS — Z7901 Long term (current) use of anticoagulants: Secondary | ICD-10-CM

## 2012-08-30 DIAGNOSIS — I428 Other cardiomyopathies: Secondary | ICD-10-CM

## 2012-08-30 DIAGNOSIS — I509 Heart failure, unspecified: Secondary | ICD-10-CM | POA: Diagnosis present

## 2012-08-30 DIAGNOSIS — I4891 Unspecified atrial fibrillation: Principal | ICD-10-CM | POA: Diagnosis present

## 2012-08-30 DIAGNOSIS — Z833 Family history of diabetes mellitus: Secondary | ICD-10-CM

## 2012-08-30 LAB — URINALYSIS, ROUTINE W REFLEX MICROSCOPIC
Glucose, UA: NEGATIVE mg/dL
Ketones, ur: 15 mg/dL — AB
Protein, ur: 100 mg/dL — AB

## 2012-08-30 LAB — COMPREHENSIVE METABOLIC PANEL
ALT: 33 U/L (ref 0–53)
AST: 29 U/L (ref 0–37)
Albumin: 2.9 g/dL — ABNORMAL LOW (ref 3.5–5.2)
Alkaline Phosphatase: 114 U/L (ref 39–117)
Glucose, Bld: 99 mg/dL (ref 70–99)
Potassium: 3.8 mEq/L (ref 3.5–5.1)
Sodium: 138 mEq/L (ref 135–145)
Total Protein: 6.6 g/dL (ref 6.0–8.3)

## 2012-08-30 LAB — CBC
HCT: 54.4 % — ABNORMAL HIGH (ref 39.0–52.0)
Hemoglobin: 18.7 g/dL — ABNORMAL HIGH (ref 13.0–17.0)
MCH: 33.2 pg (ref 26.0–34.0)
MCHC: 34.4 g/dL (ref 30.0–36.0)

## 2012-08-30 LAB — URINE MICROSCOPIC-ADD ON

## 2012-08-30 LAB — POCT I-STAT, CHEM 8
Chloride: 102 mEq/L (ref 96–112)
Glucose, Bld: 98 mg/dL (ref 70–99)
HCT: 59 % — ABNORMAL HIGH (ref 39.0–52.0)
Potassium: 3.8 mEq/L (ref 3.5–5.1)
Sodium: 142 mEq/L (ref 135–145)

## 2012-08-30 LAB — MAGNESIUM: Magnesium: 1.6 mg/dL (ref 1.5–2.5)

## 2012-08-30 LAB — POCT I-STAT TROPONIN I

## 2012-08-30 MED ORDER — ADULT MULTIVITAMIN W/MINERALS CH
1.0000 | ORAL_TABLET | Freq: Every day | ORAL | Status: DC
  Administered 2012-08-30 – 2012-09-01 (×3): 1 via ORAL
  Filled 2012-08-30 (×3): qty 1

## 2012-08-30 MED ORDER — SODIUM CHLORIDE 0.9 % IJ SOLN
3.0000 mL | Freq: Two times a day (BID) | INTRAMUSCULAR | Status: DC
  Administered 2012-09-01: 3 mL via INTRAVENOUS

## 2012-08-30 MED ORDER — RIVAROXABAN 20 MG PO TABS
20.0000 mg | ORAL_TABLET | Freq: Every day | ORAL | Status: DC
  Administered 2012-08-30 – 2012-08-31 (×2): 20 mg via ORAL
  Filled 2012-08-30 (×3): qty 1

## 2012-08-30 MED ORDER — IBUPROFEN 400 MG PO TABS
400.0000 mg | ORAL_TABLET | Freq: Four times a day (QID) | ORAL | Status: DC | PRN
  Filled 2012-08-30: qty 1

## 2012-08-30 MED ORDER — ENOXAPARIN SODIUM 40 MG/0.4ML ~~LOC~~ SOLN: 40.0000 mg | SUBCUTANEOUS | Status: DC

## 2012-08-30 MED ORDER — ONDANSETRON HCL 4 MG/2ML IJ SOLN: 4.0000 mg | Freq: Four times a day (QID) | INTRAMUSCULAR | Status: DC | PRN

## 2012-08-30 MED ORDER — VITAMIN B-1 100 MG PO TABS
100.0000 mg | ORAL_TABLET | Freq: Every day | ORAL | Status: DC
  Administered 2012-08-30 – 2012-09-01 (×3): 100 mg via ORAL
  Filled 2012-08-30 (×3): qty 1

## 2012-08-30 MED ORDER — DEXTROSE 5 % IV SOLN
5.0000 mg/h | INTRAVENOUS | Status: DC
  Administered 2012-08-30: 10 mg/h via INTRAVENOUS
  Administered 2012-08-30: 5 mg/h via INTRAVENOUS
  Administered 2012-08-31: 10 mg/h via INTRAVENOUS
  Filled 2012-08-30 (×3): qty 100

## 2012-08-30 MED ORDER — THIAMINE HCL 100 MG/ML IJ SOLN
100.0000 mg | Freq: Every day | INTRAMUSCULAR | Status: DC
  Filled 2012-08-30 (×3): qty 1

## 2012-08-30 MED ORDER — FUROSEMIDE 10 MG/ML IJ SOLN
40.0000 mg | Freq: Once | INTRAMUSCULAR | Status: AC
  Administered 2012-08-30: 40 mg via INTRAVENOUS
  Filled 2012-08-30: qty 4

## 2012-08-30 MED ORDER — DILTIAZEM LOAD VIA INFUSION
10.0000 mg | Freq: Once | INTRAVENOUS | Status: AC
  Administered 2012-08-30: 10 mg via INTRAVENOUS

## 2012-08-30 MED ORDER — LORAZEPAM 2 MG/ML IJ SOLN: 1.0000 mg | Freq: Four times a day (QID) | INTRAMUSCULAR | Status: DC | PRN

## 2012-08-30 MED ORDER — ONDANSETRON HCL 4 MG PO TABS: 4.0000 mg | ORAL_TABLET | Freq: Four times a day (QID) | ORAL | Status: DC | PRN

## 2012-08-30 MED ORDER — LORAZEPAM 0.5 MG PO TABS: 1.0000 mg | ORAL_TABLET | Freq: Four times a day (QID) | ORAL | Status: DC | PRN

## 2012-08-30 MED ORDER — FOLIC ACID 1 MG PO TABS
1.0000 mg | ORAL_TABLET | Freq: Every day | ORAL | Status: DC
  Administered 2012-08-30 – 2012-09-01 (×3): 1 mg via ORAL
  Filled 2012-08-30 (×3): qty 1

## 2012-08-30 NOTE — ED Notes (Signed)
Pt is c/o sob x 1 month, but today when he walked down the steps he couldn't breathe.  Per pt, he does not have hx of afib.  Presently afib with rvr of 176.

## 2012-08-30 NOTE — ED Notes (Signed)
Admit Doctor at bedside.  

## 2012-08-30 NOTE — H&P (Signed)
Hospital Admission Note Date: 08/30/2012  Patient name: [name] Medical record number: [id number] Date of birth: January 24, 1946 Age: 67 y.o. Gender: male PCP: Pcp Not In System  _________________________________________________________ INTERNAL MEDICINE TEACHING SERVICE CONTACT INFO     Weekday Hours (7AM-5PM): ** If no return call within 15 minutes (after trying both pagers listed below), please call after hours pagers.    First Contact:  Dr. Collier Bullock   Pager:  [phone] Second Contact:   Dr. Manson Passey   Pager:  [phone]      After Hours (after 5PM)/ Weekend / Holidays: First Contact:              Pager: [phone] Second Contact:         Pager: [phone] _________________________________________________________   Chief Complaint:  Shortness of breath  History of Present Illness:  Patient is a 67 year old gentleman with a history of hypertension, atrial fibrillation, alcohol and tobacco use, noncompliance with medications. He presents with worsening shortness of breath over the last month.  Patient states that his wife made him come in today because of the worsening shortness of breath on exertion. He also reports a cough with green sputum production the past 2-3 days. He denies having any chest pain, palpitations, changes in vision. No melena, no constipation or diarrhea. No nausea or vomiting, no abdominal pain. Denies fever or chills. He has not noticed any increased swelling in his legs.  Patient denies having any medical problems and has not seen a doctor in "years." He has not been taking any medications at home.  He admits to smoking ~4 cigarettes per day for years and binge drinking "about a 12-pack" every few weeks.  Family hx significant for HTN in mother, father died of cirrhosis   Meds:   Medication List     As of 08/30/2012  6:41 PM    Notice      You have not been prescribed any medications.         Allergies: Allergies as of 08/30/2012 - Review Complete  08/30/2012  Allergen Reaction Noted  . Codeine Rash 09/30/2010   Past Medical History  Diagnosis Date  . Cardiomyopathy   . Substance abuse     alcohol  . Hypertension   . Atrial flutter      status post ablation on 09/11/2010  . CHF (congestive heart failure)     EF 35% with diffuse hypokinesis   Past Surgical History  Procedure Laterality Date  . Hemorrhoid surgery     Family History  Problem Relation Age of Onset  . Diabetes Mother   . Hypertension Mother   . Cirrhosis Father   . Alcohol abuse Father    History   Social History  . Marital Status: Single    Spouse Name: N/A    Number of Children: N/A  . Years of Education: N/A   Occupational History  . Not on file.   Social History Main Topics  . Smoking status: Current Some Day Smoker -- 0.50 packs/day for 40 years    Types: Cigarettes  . Smokeless tobacco: Never Used  . Alcohol Use: 2.0 oz/week    4 drink(s) per week  . Drug Use: No     Comment: former coacaine user  . Sexually Active: Not on file   Other Topics Concern  . Not on file   Social History Narrative   Moved here from [place]. Lives with common law wife and grandchildren.    Review of Systems: Pertinent items noted in  HPI   Physical Exam Blood pressure 109/81, pulse 93, temperature 98 F (36.7 C), temperature source Oral, resp. rate 20, SpO2 98.00%. General:  No acute distress, alert and oriented x 3, sitting comfortably in bed HEENT:  PERRL, EOMI, moist mucous membranes Cardiovascular:  Irregularly irregular, rate approx 100, no murmurs  Respiratory:  Clear to auscultation bilaterally, no wheezes, rales, or rhonchi Abdomen:  Soft, distended, nontender, bowel sounds present, no organomegaly appreciated Extremities:  Warm and well-perfused, 1+edema.  Skin: Warm, dry, no rashes Neuro: Not anxious appearing, no depressed mood, normal affect  Lab results: Basic Metabolic Panel:  Recent Labs  16/10/96 1502 08/30/12 1516  08/30/12 1641  NA  --  142 138  K  --  3.8 3.8  CL  --  102 99  CO2  --   --  28  GLUCOSE  --  98 99  BUN  --  13 15  CREATININE  --  1.10 1.02  CALCIUM  --   --  8.4  MG 1.6  --   --   PHOS 3.9  --   --    CBC:  Recent Labs  08/30/12 1414 08/30/12 1516  WBC 18.4*  --   HGB 18.7* 20.1*  HCT 54.4* 59.0*  MCV 96.6  --   PLT 301  --    BNP:  Recent Labs  08/30/12 1642  PROBNP 5804.0*   Urine Drug Screen: Drugs of Abuse     Component Value Date/Time   LABOPIA POSITIVE* 09/07/2010 0910   COCAINSCRNUR NONE DETECTED 09/07/2010 0910   LABBENZ NONE DETECTED 09/07/2010 0910   AMPHETMU NONE DETECTED 09/07/2010 0910   THCU NONE DETECTED 09/07/2010 0910   LABBARB  Value: NONE DETECTED        DRUG SCREEN FOR MEDICAL PURPOSES ONLY.  IF CONFIRMATION IS NEEDED FOR ANY PURPOSE, NOTIFY LAB WITHIN 5 DAYS.        LOWEST DETECTABLE LIMITS FOR URINE DRUG SCREEN Drug Class       Cutoff (ng/mL) Amphetamine      1000 Barbiturate      200 Benzodiazepine   200 Tricyclics       300 Opiates          300 Cocaine          300 THC              50 09/07/2010 0910    Alcohol Level: No results found for this basename: ETH,  in the last 72 hours Urinalysis:  Recent Labs  08/30/12 1518  COLORURINE AMBER*  LABSPEC 1.027  PHURINE 5.0  GLUCOSEU NEGATIVE  HGBUR SMALL*  BILIRUBINUR LARGE*  KETONESUR 15*  PROTEINUR 100*  UROBILINOGEN 1.0  NITRITE POSITIVE*  LEUKOCYTESUR SMALL*     Imaging results:  Dg Chest Port 1 View  08/30/2012  *RADIOLOGY REPORT*  Clinical Data: Shortness of breath.  PORTABLE CHEST - 1 VIEW  Comparison: Two-view chest 09/07/2010.  Findings: Borderline cardiomegaly is present.  Mild pulmonary vascular congestion is evident.  There is an underlying component chronic interstitial coarsening.  No significant airspace consolidation is present.  The visualized soft tissues and bony thorax are unremarkable.  IMPRESSION:  1.  Borderline cardiomegaly and mild pulmonary vascular  congestion. 2.  A component of chronic interstitial coarsening is noted as well.   Original Report Authenticated By: [name], M.D.     Other results: EKG: Afib, rate ~160s  Assessment & Plan by Problem: Principal Problem:   Atrial fibrillation Active  Problems:   Idiopathic cardiomyopathy   Atrial Fibrillation with RVR Patient's HR elevated to 160s in ED, EKG showed AFib with RVR. Patient with history of the same s/p ablation and cardioversion with recurrent Afib, has not been on anticoagulation or other therapy for 2 years. Shortness of breath likely due to the paroxysmal atrial fibrillation. Last ECHO in June 2012 showed EF 55%, Gr 1 DD, concentric hypertrophy, and mild LA dilation. Patient was started on diltiazem drip in the ED and his heart rate came down to ~100, still in AFib.  -admit to telemetry bed -rate control with diltiazem drip for now -will start anticoagulation with Xarelto -repeat ECHO -BMP elevated to 5800, will diurese with 40u IV Lasix now and another dose tomorrow AM -appreciate cardiology recommendations  Idiopathic Cardiomyopathy ECHO results as above. Thought to be related to etoh vs tachycardia. Pt with SOB, elevated BMP, and edema noted on exam. Treat with lasix as above  Anion Gap  AG of 15 on admission with ketones in urine. Likely mild starvation ketoacidosis. Gap decreased to 11 in the ED. Patient without hyperglycemia, denies ingestion of methanol or toxic substances. -cont to monitor  Polycythemia Patient with chronic polycythemia, noted on previous admission. Hgb 18.7, Hct 54, RDW 16. Unclear etiology, possiblly related to smoking, sleep apnea, or COPD. Leukocytosis of 18 - ?UTI vs stress reaction  -EPO level -peripheral smear -CBC with diff tomorrow morning  HTN Not on any home meds BP 99-114/70s on admission. Monitor for now, diltiazem drip as above  Abnormal Urinalysis UA with Hgb, bili, ketones, 100 protein, nitrite pos, few  bacteria, mucus present, 3-6 wbc's Patient denies dysuria and has suprapubic tenderness on exam.  -will wait for culture results or treat if patient develops symptoms  Alcohol and tobacco abuse -cessation counseling -CIWA protocol, folic acid, thiamine, multivitamin  Health Maintenance -check Hgb A1c  DVT PPX -lovenox  Dispo -await    Signed: Denton Ar 08/30/2012, 6:41 PM

## 2012-08-30 NOTE — Consult Note (Signed)
Cardiology Consult Note  Admit date: 08/30/2012 Name: [name] 67 y.o.  male DOB:  July 31, 1945 MRN:  [id number]  Today's date:  08/30/2012  Referring Physician:    [facility]  Primary Physician:   None  Reason for Consultation:  Dyspnea and atrial fibrillation  IMPRESSIONS: 1. Acute on chronic dyspnea likely due to a multifactorial cause including atrial fibrillation, possible recent upper respiratory infection as well as congestive heart failure 2. Recurrent atrial fibrillation 3. History of cardiomyopathy with previous resolution but likely returned 4. Tobacco abuse of cigarettes 5. History of hypertension 6. Medical noncompliance  RECOMMENDATION: 1. Initiate anticoagulation with XARELTO which he has previously been on 2. Control of atrial fibrillation rate with intravenous diltiazem 3. Repeat echocardiogram 4. Obtained a BNP level as well as more complete lab testing including thyroid function and liver tests 5. Obtain urine screen for substance abuse 6. Diuresis if his BNP is elevated 7. I suspect that the atrial fibrillation has been paroxysmal as he had documented sinus rhythm on admission. We might start him on treatment with amiodarone but will defer this until he has been anticoagulated and also once we have seen what his echocardiogram shows.  HISTORY: This 67 year-old Hispanic male was seen by me around 2 years ago. He presented with rapid atrial flutter and fibrillation. Initially an attempt was made to do TEE cardioversion but he had recurrence of rapid atrial flutter following this. He had an ejection fraction of around 35%. He then underwent an atrial flutter ablation by Dr. 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 and later followed up in the office and was maintained on treatment with Pradaxa, lisinopril, Coreg and furosemide. He was started on treatment with amiodarone  because of recurrent atrial fibrillation. He had a followup echocardiogram done that showed interval improvement in his ejection fraction of 55% he stopped following up about 2 years ago. He has not had any medical treatment for the past 2 years.  About a month ago he had recurrent shortness of breath and describes worsening distant exertion. He did not have any chest discomfort suggestive of angina. He has had a cough and upper respiratory infection the past 2 days and came to the emergency room today with significant cough as well as shortness of breath. He was found to be initially in sinus rhythm with PACs but then had rapid atrial fibrillation and cardiology was consulted. He has been started on intravenous diltiazem and is currently improved.  Past Medical History  Diagnosis Date  . Cardiomyopathy   . Substance abuse     alcohol  . Hypertension   . Atrial flutter      status post ablation on 09/11/2010  . CHF (congestive heart failure)     EF 35% with diffuse hypokinesis      Past Surgical History  Procedure Laterality Date  . Hemorrhoid surgery      Allergies:  is allergic to codeine.   Medications:  None   Family History: Family Status  Relation Status Death Age  . Mother Alive   . Father Deceased 59    died of cirrhosis  . Sister Alive   . Sister Alive   . Sister Alive   . Sister Alive   . Brother Deceased     died of AIDS  . Brother Deceased     died of leukemia  . Brother Deceased     died  of GSW    Social History:   reports that he has been smoking Cigarettes.  He has a 20 pack-year smoking history. He has never used smokeless tobacco. He reports that he drinks about 2.0 ounces of alcohol per week. He reports that he does not use illicit drugs.   History   Social History Narrative   Moved here from [place]. Lives with common law wife and grandchildren.    Review of Systems: He has had some dyspnea and some cough but has not had any fever or  productive sputum. He has had some mild nocturia. No recurrent GI bleeding. Other than as noted above the remainder the remainder of the review of systems is unremarkable.  Physical Exam: BP 104/87  Pulse 90  Temp(Src) 98.5 F (36.9 C) (Oral)  Resp 20  SpO2 96%  General appearance: alert, cooperative, appears stated age and Mildly dyspneic Head: Normocephalic, without obvious abnormality, atraumatic Eyes: conjunctivae/corneas clear. PERRL, EOM's intact. Fundi benign. Throat: lips, mucosa, and tongue normal; teeth and gums normal Neck: no adenopathy, no carotid bruit, supple, symmetrical, trachea midline and Neck veins slightly distended Lungs: Bilateral wheezing and mild rales Heart: Irregular rhythm, normal S1-S2 no S3 no murmur Abdomen: soft, non-tender; bowel sounds normal; no masses,  no organomegaly Rectal: deferred Extremities: 2+ edema noted, no tenderness Pulses: 2+ and symmetric Skin: Skin color, texture, turgor normal. No rashes or lesions Neurologic: Grossly normal  Labs: CBC  Recent Labs  08/30/12 1414 08/30/12 1516  WBC 18.4*  --   RBC 5.63  --   HGB 18.7* 20.1*  HCT 54.4* 59.0*  PLT 301  --   MCV 96.6  --   MCH 33.2  --   MCHC 34.4  --   RDW 16.0*  --    CMP   Recent Labs  08/30/12 1516  NA 142  K 3.8  CL 102  GLUCOSE 98  BUN 13  CREATININE 1.10   Radiology: Cardiomegaly with interstitial congestion  EKG: Initial EKG shows sinus rhythm with significant PACs but he then developed rapid atrial fibrillation on a subsequent EKG  Signed:  W. Ashley Royalty MD [facility]   Cardiology Consultant  08/30/2012, 4:24 PM

## 2012-08-30 NOTE — ED Provider Notes (Signed)
History     CSN: [id number]  Arrival date & time 08/30/12  1317   First MD Initiated Contact with Patient 08/30/12 1346      Chief Complaint  Patient presents with  . Shortness of Breath    (Consider location/radiation/quality/duration/timing/severity/associated sxs/prior treatment) HPI Comments: 67 y.o PMH HTN, atrial fib/flutter s/p ablation and conversion, h/o alcohol abuse, tobacco abuse.  History of cardiomyopathy EF 30-35% thought to previously be due to alcohol.  Repeat echo 10/2011 EF 60-65%.  He presents for worsening sob x 1 month especially worse today when walking down the steps.    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 No PCP   Patient is a 67 y.o. male presenting with shortness of breath. The history is provided by the patient. No language interpreter was used.  Shortness of Breath Onset quality:  Gradual Duration:  1 month Progression:  Worsening Context: activity   Relieved by:  None tried Ineffective treatments:  None tried Associated symptoms: cough   Associated symptoms: no abdominal pain, no chest pain, no fever and no vomiting   Risk factors: tobacco use   Risk factors comment:  Denies etoh, drugs, +cigarette use   Past Medical History  Diagnosis Date  . Cardiomyopathy   . Substance abuse     alcohol  . Hypertension   . Atrial flutter      status post ablation on 09/11/2010  . CHF (congestive heart failure)     EF 35% with diffuse hypokinesis    History reviewed. No pertinent past surgical history.  Family History  Problem Relation Age of Onset  . Diabetes Mother   . Hypertension Mother   . Cirrhosis Father   . Alcohol abuse Father     History  Substance Use Topics  . Smoking status: Former Games developer  . Smokeless tobacco: Not on file  . Alcohol Use: No      Review of Systems  Constitutional: Negative  for fever.  Respiratory: Positive for cough and shortness of breath.        Cough dry to green sputum  Cardiovascular: Negative for chest pain and leg swelling.  Gastrointestinal: Negative for nausea, vomiting and abdominal pain.  Genitourinary: Negative for hematuria.  All other systems reviewed and are negative.    Allergies  Codeine  Home Medications   Current Outpatient Rx  Name  Route  Sig  Dispense  Refill  . carvedilol (COREG) 12.5 MG tablet   Oral   Take 12.5 mg by mouth 2 (two) times daily with a meal.           . dabigatran (PRADAXA) 150 MG CAPS   Oral   Take 150 mg by mouth every 12 (twelve) hours.           . furosemide (LASIX) 40 MG tablet   Oral   Take 40 mg by mouth daily.           [name] lisinopril (PRINIVIL,ZESTRIL) 5 MG tablet   Oral   Take 5 mg by mouth daily.           . Multiple Vitamin (MULTIVITAMIN) tablet   Oral   Take 1 tablet by mouth daily.             BP 105/72  Pulse 50  Temp(Src) 98.5 F (36.9 C) (Oral)  Resp 18  SpO2 96%  Physical Exam  Nursing note and vitals reviewed. Constitutional: He is oriented to person, place, and time. He appears well-developed and well-nourished. He is cooperative. No distress.  HENT:  Head: Normocephalic and atraumatic.  Mouth/Throat: Oropharynx is clear and moist and mucous membranes are normal. No oropharyngeal exudate.  Eyes: Conjunctivae are normal. Pupils are equal, round, and reactive to light. Right eye exhibits no discharge. Left eye exhibits no discharge. No scleral icterus.  Cardiovascular: S1 normal and S2 normal.  An irregularly irregular rhythm present.  No murmur heard. Afib with RVR 160s 1+edema b/l lower ext  Pulmonary/Chest: Effort normal and breath sounds normal. No respiratory distress. He has no wheezes.  Minimal bibasilar crackles  Abdominal: Soft. Bowel sounds are normal. He exhibits no distension. There is no tenderness.  obese  Neurological: He is alert and oriented to  person, place, and time. He has normal strength.  Skin: Skin is warm, dry and intact. No rash noted. He is not diaphoretic.  Psychiatric: He has a normal mood and affect. His speech is normal and behavior is normal. Judgment and thought content normal. Cognition and memory are normal.    ED Course  Procedures (including critical care time)  Labs Reviewed  CBC - Abnormal; Notable for the following:    WBC 18.4 (*)    Hemoglobin 18.7 (*)    HCT 54.4 (*)    RDW 16.0 (*)    All other components within normal limits  POCT I-STAT, CHEM 8 - Abnormal; Notable for the following:    Calcium, Ion 1.09 (*)    Hemoglobin 20.1 (*)    HCT 59.0 (*)    All other components within normal limits  URINALYSIS, ROUTINE W REFLEX MICROSCOPIC  MAGNESIUM  PHOSPHORUS  POCT I-STAT TROPONIN I   Dg Chest Port 1 View  08/30/2012  *RADIOLOGY REPORT*  Clinical Data: Shortness of breath.  PORTABLE CHEST - 1 VIEW  Comparison: Two-view chest 09/07/2010.  Findings: Borderline cardiomegaly is present.  Mild pulmonary vascular congestion is evident.  There is an underlying component chronic interstitial coarsening.  No significant airspace consolidation is present.  The visualized soft tissues and bony thorax are unremarkable.  IMPRESSION:  1.  Borderline cardiomegaly and mild pulmonary vascular congestion. 2.  A component of chronic interstitial coarsening is noted as well.   Original Report Authenticated By: [name], M.D.      1. Atrial fibrillation with RVR   2. Leukocytosis      Date: 08/30/2012  Rate: 168  Rhythm: atrial fibrillation  QRS Axis: normal  Intervals: normal  ST/T Wave abnormalities: normal  Conduction Disutrbances:none  Narrative Interpretation:   Old EKG Reviewed: changes noted    MDM  Atrial fibrillation with RVR Pt noncompliant with Coreg at home, not taking Pradaxa, never followed with cards EKG +Afib, having RVR 160s to 170s labs Dilt bolus and infusion [name] to  come see the patient but IMTS to admit   History of systolic HF EF 30% but improved with last echo EF 10/2011 60-65%  Shirlee Latch MD [phone]          Annett Gula, MD 08/30/12 1538  Annett Gula, MD 08/30/12 1551

## 2012-08-30 NOTE — ED Provider Notes (Addendum)
I saw and evaluated the patient, reviewed the resident's note and I agree with the findings and plan. Agree with EKG interpretation if present.    Pt with history of afib non-compliant with medications. Has had ablation before. Briefly converted to NSF but immediately back into Afib with RVR. Rate controlled on Cardizem drip. Admit for further eval.    CRITICAL CARE Performed by: Pollyann Savoy.   Total critical care time: 40  Critical care time was exclusive of separately billable procedures and treating other patients.  Critical care was necessary to treat or prevent imminent or life-threatening deterioration.  Critical care was time spent personally by me on the following activities: development of treatment plan with patient and/or surrogate as well as nursing, discussions with consultants, evaluation of patient's response to treatment, examination of patient, obtaining history from patient or surrogate, ordering and performing treatments and interventions, ordering and review of laboratory studies, ordering and review of radiographic studies, pulse oximetry and re-evaluation of patient's condition.   Charles B. Bernette Mayers, MD 08/30/12 [phone]

## 2012-08-30 NOTE — ED Notes (Signed)
Cardiology at bedside,  

## 2012-08-30 NOTE — ED Notes (Signed)
Pt states has had sob x 1 month got worse yesterday states it was hard to sleep last night sob on exertion has not seen a dr in a while upon arrival pt ekg  Had rapid heart beat

## 2012-08-31 DIAGNOSIS — I4891 Unspecified atrial fibrillation: Principal | ICD-10-CM

## 2012-08-31 DIAGNOSIS — I428 Other cardiomyopathies: Secondary | ICD-10-CM

## 2012-08-31 LAB — CBC WITH DIFFERENTIAL/PLATELET
Basophils Relative: 1 % (ref 0–1)
Eosinophils Absolute: 0.3 10*3/uL (ref 0.0–0.7)
Eosinophils Relative: 2 % (ref 0–5)
HCT: 53.1 % — ABNORMAL HIGH (ref 39.0–52.0)
Hemoglobin: 18.3 g/dL — ABNORMAL HIGH (ref 13.0–17.0)
MCH: 33.3 pg (ref 26.0–34.0)
MCHC: 34.5 g/dL (ref 30.0–36.0)
MCV: 96.5 fL (ref 78.0–100.0)
Monocytes Absolute: 1.2 10*3/uL — ABNORMAL HIGH (ref 0.1–1.0)
Monocytes Relative: 9 % (ref 3–12)
Neutrophils Relative %: 75 % (ref 43–77)

## 2012-08-31 LAB — BASIC METABOLIC PANEL
BUN: 16 mg/dL (ref 6–23)
Creatinine, Ser: 0.86 mg/dL (ref 0.50–1.35)
GFR calc Af Amer: >90 mL/min (ref >90)
GFR calc non Af Amer: 89 mL/min — ABNORMAL LOW (ref >90)

## 2012-08-31 LAB — PROTIME-INR: Prothrombin Time: 26.4 seconds — ABNORMAL HIGH (ref 11.6–15.2)

## 2012-08-31 LAB — HEPATIC FUNCTION PANEL
Bilirubin, Direct: 0.3 mg/dL (ref 0.0–0.3)
Indirect Bilirubin: 1.2 mg/dL — ABNORMAL HIGH (ref 0.3–0.9)
Total Bilirubin: 1.5 mg/dL — ABNORMAL HIGH (ref 0.3–1.2)

## 2012-08-31 LAB — URINE CULTURE
Colony Count: NO GROWTH
Culture: NO GROWTH

## 2012-08-31 LAB — HEMOGLOBIN A1C: Hgb A1c MFr Bld: 5.4 % (ref <5.7)

## 2012-08-31 MED ORDER — POTASSIUM CHLORIDE CRYS ER 20 MEQ PO TBCR
40.0000 meq | EXTENDED_RELEASE_TABLET | Freq: Once | ORAL | Status: AC
  Administered 2012-08-31: 40 meq via ORAL
  Filled 2012-08-31: qty 2

## 2012-08-31 MED ORDER — AMIODARONE HCL 200 MG PO TABS
200.0000 mg | ORAL_TABLET | Freq: Every day | ORAL | Status: DC
  Administered 2012-08-31 – 2012-09-01 (×2): 200 mg via ORAL
  Filled 2012-08-31 (×2): qty 1

## 2012-08-31 MED ORDER — DILTIAZEM HCL ER COATED BEADS 300 MG PO CP24
300.0000 mg | ORAL_CAPSULE | Freq: Every day | ORAL | Status: DC
  Administered 2012-08-31: 300 mg via ORAL
  Filled 2012-08-31: qty 1

## 2012-08-31 MED ORDER — FUROSEMIDE 10 MG/ML IJ SOLN
40.0000 mg | Freq: Once | INTRAMUSCULAR | Status: AC
  Administered 2012-08-31: 40 mg via INTRAVENOUS
  Filled 2012-08-31: qty 4

## 2012-08-31 MED ORDER — CARVEDILOL 3.125 MG PO TABS
3.1250 mg | ORAL_TABLET | Freq: Two times a day (BID) | ORAL | Status: DC
  Administered 2012-08-31 – 2012-09-01 (×2): 3.125 mg via ORAL
  Filled 2012-08-31 (×4): qty 1

## 2012-08-31 MED ORDER — LISINOPRIL 5 MG PO TABS
5.0000 mg | ORAL_TABLET | Freq: Every day | ORAL | Status: DC
  Administered 2012-08-31 – 2012-09-01 (×2): 5 mg via ORAL
  Filled 2012-08-31 (×2): qty 1

## 2012-08-31 NOTE — Progress Notes (Signed)
Utilization Review Completed [place] Loyal J. Baine Decesare, RN, BSN, NCM [phone]  

## 2012-08-31 NOTE — H&P (Signed)
Internal Medicine Attending Admission Note Date: 08/31/2012  Patient name: [name] Medical record number: [id number] Date of birth: 08-18-1945 Age: 67 y.o. Gender: male  I saw and evaluated the patient. I reviewed the resident's note and I agree with the resident's findings and plan as documented in the resident's note.  Chief Complaint(s): Shortness of breath x 1 month.  History - key components related to admission:  [name] is a 67 year old man with a history of hypertension, alcohol and tobacco abuse, and atrial fibrillation who has been noncompliant with his medications for years. He states he has gradually been getting more short of breath with increasing dyspnea on exertion over the last month. His wife has been getting on him about this and he finally decided to present for further evaluation. He denies any fevers, shakes, chills, nausea, vomiting, palpitations, or orthopnea. He does admit to paroxysmal nocturnal dyspnea. He is without other complaints at this time.  Physical Exam - key components related to admission:  Filed Vitals:   08/31/12 0522 08/31/12 0941 08/31/12 1400 08/31/12 1542  BP: 108/76 106/70 96/72 107/74  Pulse: 92 74 69 72  Temp: 97.9 F (36.6 C) 97.5 F (36.4 C) 97.6 F (36.4 C)   TempSrc: Oral Oral Oral   Resp: 20  20 18   Height:      Weight: 181 lb 10.5 oz (82.4 kg)     SpO2: 97% 94% 97% 94%   General: Well-developed, well-nourished man sitting comfortably in bed in no acute distress. Lungs: Clear to auscultation bilaterally without wheezes, rhonchi, or rales. Heart: Regular rate and rhythm without murmurs, rubs, gallops. He is not tachycardic and not irregular. Abdomen: Soft, nontender, active bowel sounds. Extremities: Without edema.  Lab results:  Basic Metabolic Panel:  Recent Labs  65/78/46 1502  08/30/12 1641 08/31/12 0545  NA  --   < > 138 140  K  --   < > 3.8 3.4*  CL  --   < > 99 99  CO2  --   --  28 29  GLUCOSE  --    < > 99 98  BUN  --   < > 15 16  CREATININE  --   < > 1.02 0.86  CALCIUM  --   --  8.4 8.4  MG 1.6  --   --   --   PHOS 3.9  --   --   --   < > = values in this interval not displayed. Liver Function Tests:  Recent Labs  08/30/12 1641 08/31/12 0545  AST 29 31  ALT 33 33  ALKPHOS 114 124*  BILITOT 1.8* 1.5*  PROT 6.6 6.5  ALBUMIN 2.9* 2.8*   CBC:  Recent Labs  08/30/12 1414 08/30/12 1516 08/31/12 0545  WBC 18.4*  --  12.8*  NEUTROABS  --   --  9.6*  HGB 18.7* 20.1* 18.3*  HCT 54.4* 59.0* 53.1*  MCV 96.6  --  96.5  PLT 301  --  263   Hemoglobin A1C:  Recent Labs  08/31/12 0545  HGBA1C 5.4   Thyroid Function Tests:  Recent Labs  08/30/12 1641  TSH 1.035   Coagulation:  Recent Labs  08/31/12 0545  INR 2.58*   Urinalysis:  Specific gravity 1.027, pH 5.0, small hemoglobin, large urine bilirubin, 15 ketones, 100 protein, positive nitrite, small leukocytes, 3-6 white blood cells per high-power field, 0-2 red blood cells per high-power field.  Misc. Labs:  Urine culture: No growth BNP 5804  Erythropoietin: Pending  Imaging results:  Dg Chest Port 1 View  08/30/2012  *RADIOLOGY REPORT*  Clinical Data: Shortness of breath.  PORTABLE CHEST - 1 VIEW  Comparison: Two-view chest 09/07/2010.  Findings: Borderline cardiomegaly is present.  Mild pulmonary vascular congestion is evident.  There is an underlying component chronic interstitial coarsening.  No significant airspace consolidation is present.  The visualized soft tissues and bony thorax are unremarkable.  IMPRESSION:  1.  Borderline cardiomegaly and mild pulmonary vascular congestion. 2.  A component of chronic interstitial coarsening is noted as well.   Original Report Authenticated By: [name], M.D.    Other results:  EKG: Atrial fibrillation at 123 beats per minute, normal axis and intervals, right atrial enlargement, left atrial enlargement, no LVH, no significant Q waves, nonspecific  ST-T changes. Compared to the EKG on 09/30/2010 the atrial fibrillation is new and the LVH on the prior ECG by voltage in aVL has resolved.  Assessment & Plan by Problem:  [name] is a 67 year old man with a history of hypertension, alcohol and tobacco abuse, and atrial fibrillation who presents with increasing shortness of breath x1 month. He was found to be in atrial fibrillation with rapid ventricular rate. He has known atrial fibrillation but hasn't been noncompliant with his medications for years. Interestingly, he reportedly was documented to be in normal sinus rhythm by a rhythm strip yesterday. Thus, his atrial fibrillation remains paroxysmal.  1) Atrial fibrillation with rapid ventricular rate: Resolved on a diltiazem drip. He is being converted to oral diltiazem 300 mg by mouth daily. He will also require anticoagulation and the decision was made to start Xarelto. Echocardiogram demonstrated left ventricular ejection fraction of 40-45% with some diastolic dysfunction. 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 We will confirm with telemetry, but on my examination this evening, he appears to be back in normal sinus rhythm. If this is the case we will start amiodarone in hopes of maintaining normal sinus rhythm.  2) Disposition: We anticipate he will be stable for discharge within the next 24-48 hours if we can maintain a controlled ventricular rate. Followup can be in the [facility] or other primary care physician of his choice.

## 2012-08-31 NOTE — Progress Notes (Signed)
  Echocardiogram 2D Echocardiogram has been performed.  Cathie Beams 08/31/2012, 9:18 AM

## 2012-08-31 NOTE — Progress Notes (Signed)
Subjective:  Breathing improved overnight.  No dyspnea today, no chest pain  Objective:  Vital Signs in the last 24 hours: BP 106/70  Pulse 74  Temp(Src) 97.5 F (36.4 C) (Oral)  Resp 20  Ht 5\' 6"  (1.676 m)  Wt 82.4 kg (181 lb 10.5 oz)  BMI 29.33 kg/m2  SpO2 94%  Physical Exam: Pleasant Hispanic male in NAD Lungs:  Clear  Cardiac:  iregular rhythm, normal S1 and S2, no S3 Abdomen:  Soft, nontender, no masses Extremities:  No edema present  Intake/Output from previous day: 02/11 0701 - 02/12 0700 In: 520.4 [P.O.:360; I.V.:160.4] Out: 2250 [Urine:2250] Weight Filed Weights   08/30/12 1932 08/31/12 0522  Weight: 82.7 kg (182 lb 5.1 oz) 82.4 kg (181 lb 10.5 oz)    Lab Results: Basic Metabolic Panel:  Recent Labs  40/98/11 1641 08/31/12 0545  NA 138 140  K 3.8 3.4*  CL 99 99  CO2 28 29  GLUCOSE 99 98  BUN 15 16  CREATININE 1.02 0.86    CBC:  Recent Labs  08/30/12 1414 08/30/12 1516 08/31/12 0545  WBC 18.4*  --  12.8*  NEUTROABS  --   --  9.6*  HGB 18.7* 20.1* 18.3*  HCT 54.4* 59.0* 53.1*  MCV 96.6  --  96.5  PLT 301  --  263    BNP    Component Value Date/Time   PROBNP 5804.0* 08/30/2012 1642    Telemetry: Atrial fibrillation rate controlled  ECHO Moderate concentric LVH mild global hypokinesis EF around 40-45%  Assessment/Plan:   1. Recurrent atrial fibrillation 2. 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 Noncompliance 4. Polycythemia  Rec:  He will need anticoagulation for atrial fibrillation.  He is currently on Xarelto.  Will need beta blocker, ACE.  He was in sinus rhythm yesterday when he was in the ER, so I think we can start amiodarone back for the time being.      Darden Palmer  MD [facility]  08/31/2012, 1:47 PM

## 2012-08-31 NOTE — Progress Notes (Signed)
Subjective: Patient states that he did well overnight. No issues, no chest pain, SOB.  Denies N/V.  Objective: Vital signs in last 24 hours: Filed Vitals:   08/30/12 1830 08/30/12 1932 08/31/12 0200 08/31/12 0522  BP: 109/81 110/70 106/78 108/76  Pulse: 93 93 90 92  Temp:  98.4 F (36.9 C)  97.9 F (36.6 C)  TempSrc:  Oral  Oral  Resp: 20 20  20   Height:  5\' 6"  (1.676 m)    Weight:  182 lb 5.1 oz (82.7 kg)  181 lb 10.5 oz (82.4 kg)  SpO2: 98% 97%  97%   Weight change:   Intake/Output Summary (Last 24 hours) at 08/31/12 0823 Last data filed at 08/31/12 0600  Gross per 24 hour  Intake 520.42 ml  Output   2250 ml  Net -1729.58 ml    Physical Exam Blood pressure 106/70, pulse 74, temperature 97.5 F (36.4 C), temperature source Oral, resp. rate 20, height 5\' 6"  (1.676 m), weight 181 lb 10.5 oz (82.4 kg), SpO2 94.00%. General:  No acute distress, alert and oriented x 3, well-appearing  HEENT:  PERRL, EOMI, moist mucous membranes Cardiovascular: Irregularly irregular rhythm, no murmurs Respiratory:  Clear to auscultation bilaterally, no wheezes, rales, or rhonchi Abdomen:  Soft, nondistended, nontender, bowel sounds present Extremities:  Warm and well-perfused, trace edema L>R, no calf tenderness, neg Homans' sign  Skin: Warm, dry, no rashes Neuro: Not anxious appearing, no depressed mood, normal affect  Lab Results: Basic Metabolic Panel:  Recent Labs Lab 08/30/12 1502  08/30/12 1641 08/31/12 0545  NA  --   < > 138 140  K  --   < > 3.8 3.4*  CL  --   < > 99 99  CO2  --   --  28 29  GLUCOSE  --   < > 99 98  BUN  --   < > 15 16  CREATININE  --   < > 1.02 0.86  CALCIUM  --   --  8.4 8.4  MG 1.6  --   --   --   PHOS 3.9  --   --   --   < > = values in this interval not displayed. Liver Function Tests:  Recent Labs Lab 08/30/12 1641 08/31/12 0545  AST 29 31  ALT 33 33  ALKPHOS 114 124*  BILITOT 1.8* 1.5*  PROT 6.6 6.5  ALBUMIN 2.9* 2.8*   CBC:  Recent  Labs Lab 08/30/12 1414 08/30/12 1516 08/31/12 0545  WBC 18.4*  --  12.8*  NEUTROABS  --   --  9.6*  HGB 18.7* 20.1* 18.3*  HCT 54.4* 59.0* 53.1*  MCV 96.6  --  96.5  PLT 301  --  263   BNP:  Recent Labs Lab 08/30/12 1642  PROBNP 5804.0*   Thyroid Function Tests:  Recent Labs Lab 08/30/12 1641  TSH 1.035   Coagulation:  Recent Labs Lab 08/31/12 0545  LABPROT 26.4*  INR 2.58*   Urine Drug Screen: Drugs of Abuse     Component Value Date/Time   LABOPIA POSITIVE* 09/07/2010 0910   COCAINSCRNUR NONE DETECTED 09/07/2010 0910   LABBENZ NONE DETECTED 09/07/2010 0910   AMPHETMU NONE DETECTED 09/07/2010 0910   THCU NONE DETECTED 09/07/2010 0910   LABBARB  Value: NONE DETECTED        DRUG SCREEN FOR MEDICAL PURPOSES ONLY.  IF CONFIRMATION IS NEEDED FOR ANY PURPOSE, NOTIFY LAB WITHIN 5 DAYS.  LOWEST DETECTABLE LIMITS FOR URINE DRUG SCREEN Drug Class       Cutoff (ng/mL) Amphetamine      1000 Barbiturate      200 Benzodiazepine   200 Tricyclics       300 Opiates          300 Cocaine          300 THC              50 09/07/2010 0910    Urinalysis:  Recent Labs Lab 08/30/12 1518  COLORURINE AMBER*  LABSPEC 1.027  PHURINE 5.0  GLUCOSEU NEGATIVE  HGBUR SMALL*  BILIRUBINUR LARGE*  KETONESUR 15*  PROTEINUR 100*  UROBILINOGEN 1.0  NITRITE POSITIVE*  LEUKOCYTESUR SMALL*    Studies/Results: Dg Chest Port 1 View  08/30/2012  *RADIOLOGY REPORT*  Clinical Data: Shortness of breath.  PORTABLE CHEST - 1 VIEW  Comparison: Two-view chest 09/07/2010.  Findings: Borderline cardiomegaly is present.  Mild pulmonary vascular congestion is evident.  There is an underlying component chronic interstitial coarsening.  No significant airspace consolidation is present.  The visualized soft tissues and bony thorax are unremarkable.  IMPRESSION:  1.  Borderline cardiomegaly and mild pulmonary vascular congestion. 2.  A component of chronic interstitial coarsening is noted as well.   Original  Report Authenticated By: [name], M.D.    Medications:  Medications reviewed  Scheduled Meds: . folic acid  1 mg Oral Daily  . multivitamin with minerals  1 tablet Oral Daily  . rivaroxaban  20 mg Oral Q supper  . sodium chloride  3 mL Intravenous Q12H  . thiamine  100 mg Oral Daily   Or  . thiamine  100 mg Intravenous Daily   Continuous Infusions: . diltiazem (CARDIZEM) infusion 10 mg/hr (08/30/12 2222)   PRN Meds:.ibuprofen, LORazepam, LORazepam, ondansetron (ZOFRAN) IV, ondansetron  Assessment/Plan:  Patient is a 67 year old gentleman with a history of hypertension, atrial fibrillation, alcohol and tobacco use, noncompliance with medications. He presents with worsening shortness of breath over the last month.  Atrial Fibrillation with RVR  Patient's HR elevated to 160s in ED, EKG showed AFib with RVR. Patient with history of the same s/p ablation and cardioversion with recurrent Afib, has not been on anticoagulation or other therapy for 2 years. Shortness of breath likely due to the paroxysmal atrial fibrillation. Last ECHO in June 2012 showed EF 55%, Gr 1 DD, concentric hypertrophy, and mild LA dilation.  Patient was started on diltiazem drip in the ED and heart rate came down to ~100, still in AFib.    -convert drip to PO diltiazem, 300mg  daily -cont anticoagulation with Xarelto  -ECHO read pending  -lasix 40mg  IV today, then reevaluate. Replete K as needed -appreciate cardiology recommendations   Idiopathic Cardiomyopathy  ECHO results as above. Thought to be related to etoh vs tachycardia. Pt with SOB, elevated BMP, and edema noted on exam. Treat with lasix as above. Repeat ECHO pending  Anion Gap  AG of 15 on admission with ketones in urine. Likely mild starvation ketoacidosis vs alcoholic ketoacidosis. Gap decreased to 11 in the ED. Patient without hyperglycemia, denies ingestion of methanol or toxic substances.  AG 12 today -cont to monitor    Polycythemia  Patient with chronic polycythemia, noted on previous admission. Hgb 18.7, Hct 54, RDW 16. Unclear etiology, possiblly related to smoking, sleep apnea, or COPD. Leukocytosis of 18 - ?UTI vs stress reaction - resolving, WBC 12.8 today  -EPO level pending -peripheral smear   HTN  Not on any home meds  BP 99-114/70s on admission.  Monitor for now, diltiazem PO as above   Abnormal Urinalysis  UA with Hgb, bili, ketones, 100 protein, nitrite pos, few bacteria, mucus present, 3-6 wbc's  Patient denies dysuria and has suprapubic tenderness on exam.  -will wait for culture results or treat if patient develops symptoms   Alcohol and tobacco abuse  -cessation counseling  -CIWA protocol, folic acid, thiamine, multivitamin   Health Maintenance  -check Hgb A1c   DVT PPX  -lovenox   Dispo  -anticipate dc in 1-2 days -will need [facility] f/u     LOS: 1 day   [place] Ar 08/31/2012, 8:23 AM

## 2012-09-01 LAB — BASIC METABOLIC PANEL
BUN: 16 mg/dL (ref 6–23)
Chloride: 99 mEq/L (ref 96–112)
GFR calc Af Amer: >90 mL/min (ref >90)
GFR calc non Af Amer: 89 mL/min — ABNORMAL LOW (ref >90)
Glucose, Bld: 98 mg/dL (ref 70–99)
Potassium: 3.5 mEq/L (ref 3.5–5.1)
Sodium: 138 mEq/L (ref 135–145)

## 2012-09-01 LAB — CBC
HCT: 52.5 % — ABNORMAL HIGH (ref 39.0–52.0)
Hemoglobin: 18.5 g/dL — ABNORMAL HIGH (ref 13.0–17.0)
MCH: 33.8 pg (ref 26.0–34.0)
MCHC: 35.2 g/dL (ref 30.0–36.0)
RBC: 5.47 MIL/uL (ref 4.22–5.81)

## 2012-09-01 MED ORDER — AMIODARONE HCL 200 MG PO TABS: 200.0000 mg | ORAL_TABLET | Freq: Every day | ORAL | Status: DC

## 2012-09-01 MED ORDER — RIVAROXABAN 20 MG PO TABS: 20.0000 mg | ORAL_TABLET | Freq: Every day | ORAL | Status: DC

## 2012-09-01 MED ORDER — LISINOPRIL 5 MG PO TABS: 5.0000 mg | ORAL_TABLET | Freq: Every day | ORAL | Status: DC

## 2012-09-01 MED ORDER — CARVEDILOL 3.125 MG PO TABS: 3.1250 mg | ORAL_TABLET | Freq: Two times a day (BID) | ORAL | Status: DC

## 2012-09-01 MED ORDER — WARFARIN SODIUM 5 MG PO TABS: 5.0000 mg | ORAL_TABLET | Freq: Every day | ORAL | Status: DC

## 2012-09-01 NOTE — Progress Notes (Signed)
Internal Medicine Attending  Date: 09/01/2012  Patient name: [name] Medical record number: [id number] Date of birth: 02/03/1946 Age: 67 y.o. Gender: male  I saw and evaluated the patient. I reviewed the resident's note by Dr. Manson Passey and I agree with the resident's findings and plans as documented in his progress note.  Mr.: Hamilton felt very well this morning. He denied any shortness of breath, chest pain, or palpitations.  He had converted back into atrial fibrillation since I saw him the night before. Despite this his rate was controlled. He was felt to be stable for discharge on amiodarone, Coreg, and warfarin. Followup will be in the [facility] and the [facility].

## 2012-09-01 NOTE — Progress Notes (Signed)
Subjective:  No SOB or dyspnea today.  Not sure I believe the weights.  Objective:  Vital Signs in the last 24 hours: BP 116/69  Pulse 86  Temp(Src) 97.4 F (36.3 C) (Oral)  Resp 18  Ht 5\' 6"  (1.676 m)  Wt 75.932 kg (167 lb 6.4 oz)  BMI 27.03 kg/m2  SpO2 95%  Physical Exam: Pleasant Hispanic male in NAD Lungs:  Clear  Cardiac:  iregular rhythm, normal S1 and S2, no S3 Abdomen:  Soft, nontender, no masses Extremities:  No edema present  Intake/Output from previous day: 02/12 0701 - 02/13 0700 In: 997 [P.O.:960; I.V.:37] Out: 800 [Urine:800] Weight Filed Weights   08/30/12 1932 08/31/12 0522 09/01/12 0513  Weight: 82.7 kg (182 lb 5.1 oz) 82.4 kg (181 lb 10.5 oz) 75.932 kg (167 lb 6.4 oz)    Lab Results: Basic Metabolic Panel:  Recent Labs  16/10/96 0545 09/01/12 0605  NA 140 138  K 3.4* 3.5  CL 99 99  CO2 29 29  GLUCOSE 98 98  BUN 16 16  CREATININE 0.86 0.86    CBC:  Recent Labs  08/31/12 0545 09/01/12 0605  WBC 12.8* 12.8*  NEUTROABS 9.6*  --   HGB 18.3* 18.5*  HCT 53.1* 52.5*  MCV 96.5 96.0  PLT 263 278    BNP    Component Value Date/Time   PROBNP 5804.0* 08/30/2012 1642    Telemetry: Atrial fibrillation rate controlled  ECHO Moderate concentric LVH mild global hypokinesis EF around 40-45%  Assessment/Plan:   1. Recurrent atrial fibrillation 2. 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 Noncompliance 4. Polycythemia  Rec:  OK to d/c from cardiac viewpoint.  Will need to be sure he has the resources to get his medications.  I would need to see in 1-2 weeks.     Darden Palmer  MD [facility]  09/01/2012, 11:01 AM
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
## 2012-09-01 NOTE — Progress Notes (Signed)
Verbalized understanding of discharge instructions.  The need to not consume alcohol with medication reinforced.  Respiratory exchange even and unlabored.

## 2012-09-01 NOTE — Discharge Summary (Signed)
Internal Medicine Teaching [facility] Discharge Note  Name: [name] MRN: [id number] DOB: [birth year] 67 y.o.  Date of Admission: 08/30/2012  1:42 PM Date of Discharge: 09/01/2012 Attending Physician: [name], MD  Discharge Diagnosis: 1. Atrial Fibrillation with RVR - rate controlled with dilt -> coreg 2. 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 Polycytemia - chronic, unchanged  Discharge Medications:   Medication List    TAKE these medications       amiodarone 200 MG tablet  Commonly known as:  PACERONE  Take 1 tablet (200 mg total) by mouth daily.     carvedilol 3.125 MG tablet  Commonly known as:  COREG  Take 1 tablet (3.125 mg total) by mouth 2 (two) times daily with a meal.     lisinopril 5 MG tablet  Commonly known as:  PRINIVIL,ZESTRIL  Take 1 tablet (5 mg total) by mouth daily.     warfarin 5 MG tablet  Commonly known as:  COUMADIN  Take 1 tablet (5 mg total) by mouth daily.        Disposition and follow-up:   Mr.Phyllis Hamilton was discharged from [facility] in stable and improved condition, with control of HR down to 70-80's.  At hospital follow-up, please address the following: 1. Check BMET, given recent addition of lisinopril 2. Ensure patient has been following up with Dr. Alexandria Hamilton for Coumadin  Follow-up Appointments:     Discharge Orders   Future Orders Complete By Expires     Call MD for:  persistant dizziness or light-headedness  As directed     Call MD for:  temperature >100.4  As directed     Diet - low sodium heart healthy  As directed     Increase activity slowly  As directed        Consultations: Treatment Team:  Othella Boyer, MD  Procedures Performed:  Dg Chest Port 1 View  08/30/2012  *RADIOLOGY REPORT*  Clinical Data: Shortness of breath.  PORTABLE CHEST - 1 VIEW  Comparison: Two-view chest 09/07/2010.  Findings: Borderline cardiomegaly is present.  Mild pulmonary  vascular congestion is evident.  There is an underlying component chronic interstitial coarsening.  No significant airspace consolidation is present.  The visualized soft tissues and bony thorax are unremarkable.  IMPRESSION:  1.  Borderline cardiomegaly and mild pulmonary vascular congestion. 2.  A component of chronic interstitial coarsening is noted as well.   Original Report Authenticated By: [name], M.D.     2D Echo:  - Left ventricle: Wall thickness was increased in a pattern of moderate LVH. Systolic function was mildly to moderately reduced. The estimated ejection fraction was in the range of 40% to 45%. Mild diffuse hypokinesis. - Aortic valve: Mild regurgitation. - Left atrium: The atrium was moderately to severely dilated. - Pericardium, extracardiac: A small pericardial effusion was identified.  Admission HPI:  Patient is a 67 year old gentleman with a history of hypertension, atrial fibrillation, alcohol and tobacco use, noncompliance with medications. He presents with worsening shortness of breath over the last month.  Patient states that his wife made him come in today because of the worsening shortness of breath on exertion. He also reports a cough with green sputum production the past 2-3 days. He denies having any chest pain, palpitations, changes in vision. No melena, no constipation or diarrhea. No nausea or vomiting, no abdominal pain. Denies fever or chills. He has not noticed any increased swelling in his legs.  Patient denies  having any medical problems and has not seen a doctor in "years." He has not been taking any medications at home.  He admits to smoking ~4 cigarettes per day for years and binge drinking "about a 12-pack" every few weeks.   Admission Physical Exam Blood pressure 109/81, pulse 93, temperature 98 F (36.7 C), temperature source Oral, resp. rate 20, SpO2 98.00%.  General: No acute distress, alert and oriented x 3, sitting comfortably in  bed  HEENT: PERRL, EOMI, moist mucous membranes  Cardiovascular: Irregularly irregular, rate approx 100, no murmurs  Respiratory: Clear to auscultation bilaterally, no wheezes, rales, or rhonchi  Abdomen: Soft, distended, nontender, bowel sounds present, no organomegaly appreciated  Extremities: Warm and well-perfused, 1+edema.  Skin: Warm, dry, no rashes  Neuro: Not anxious appearing, no depressed mood, normal affect  Admission Labs Basic Metabolic Panel:   08/30/12 1502  08/30/12 1516  08/30/12 1641   NA  --  142  138   K  --  3.8  3.8   CL  --  102  99   CO2  --  --  28   GLUCOSE  --  98  99   BUN  --  13  15   CREATININE  --  1.10  1.02   CALCIUM  --  --  8.4   MG  1.6  --  --   PHOS  3.9  --  --    CBC:   08/30/12 1414  08/30/12 1516   WBC  18.4*  --   HGB  18.7*  20.1*   HCT  54.4*  59.0*   MCV  96.6  --   PLT  301  --    BNP:   08/30/12 1642   PROBNP  5804.0*     Hospital Course by problem list: 1. Atrial Fibrillation with RVR - The patient has a history of atrial fibrillation, with prior ablation 09/11/10, presenting again with afib, found to have heart rate of 180 on admission.  The patient had previously been prescribed amiodraone, pradaxa, coreg, and lisinopril after 2012 hospitalization, but presented non-compliant with all medications for at least 1 year.  The patient was placed on a diltiazem drip, with lowering of his heart rate to the 80's.  He was then converted to PO coreg, and started on amiodarone and lisinopril as well.  The patient was initially anticoagulated with Xarelto, but after discovering that his insurance would not pay for this medication, he was sent home on Warfarin (CHADS2 = 1).  If patient cannot show compliance with INR checks on warfarin, aspirin may be an acceptable alternative in the future.  2. Cardiomyopathy - The patient has a history of heart failure, with variable EF's on past echos, thought to be secondary to his afib with  tachycardia.  Repeat echo during this admission showed an EF of 40-45%.  The patient was started on coreg and lisinopril.  3. Polycytemia - The patient has a history of polycythemia, which is again noted during this admission.  The etiology is likely secondary to tobacco abuse and possible undiagnosed pulmonary disease (COPD and OSA seem possible based on smoking history and body habitus, but no prior formal testing).  Pathologist review commented only on reactive leukocytosis, and erythropoietin level is pending at the time of discharge, but can be followed in the outpatient setting.  Time spent on discharge: 45 minutes  Discharge Vitals:  BP 116/69  Pulse 86  Temp(Src) 97.4 F (36.3 C) (Oral)  Resp 18  Ht 5\' 6"  (1.676 m)  Wt 167 lb 6.4 oz (75.932 kg)  BMI 27.03 kg/m2  SpO2 95%  Discharge Labs:  Results for orders placed during the hospital encounter of 08/30/12 (from the past 24 hour(s))  BASIC METABOLIC PANEL     Status: Abnormal   Collection Time    09/01/12  6:05 AM      Result Value Range   Sodium 138  135 - 145 mEq/L   Potassium 3.5  3.5 - 5.1 mEq/L   Chloride 99  96 - 112 mEq/L   CO2 29  19 - 32 mEq/L   Glucose, Bld 98  70 - 99 mg/dL   BUN 16  6 - 23 mg/dL   Creatinine, Ser 1.61  0.50 - 1.35 mg/dL   Calcium 8.7  8.4 - 09.6 mg/dL   GFR calc non Af Amer 89 (*) >90 mL/min   GFR calc Af Amer >90  >90 mL/min  CBC     Status: Abnormal   Collection Time    09/01/12  6:05 AM      Result Value Range   WBC 12.8 (*) 4.0 - 10.5 K/uL   RBC 5.47  4.22 - 5.81 MIL/uL   Hemoglobin 18.5 (*) 13.0 - 17.0 g/dL   HCT 04.5 (*) 40.9 - 81.1 %   MCV 96.0  78.0 - 100.0 fL   MCH 33.8  26.0 - 34.0 pg   MCHC 35.2  30.0 - 36.0 g/dL   RDW 91.4 (*) 78.2 - 95.6 %   Platelets 278  150 - 400 K/uL    Signed: Janalyn Harder 09/01/2012, 1:56 PM

## 2012-09-11 ENCOUNTER — Inpatient Hospital Stay (HOSPITAL_COMMUNITY)
Admission: EM | Admit: 2012-09-11 | Discharge: 2012-09-13 | DRG: 308 | Disposition: A | Discharge status: Home / Self Care | Payer: Medicare Other | Attending: Internal Medicine | Admitting: Internal Medicine

## 2012-09-11 ENCOUNTER — Emergency Department (HOSPITAL_COMMUNITY): Payer: Medicare Other

## 2012-09-11 DIAGNOSIS — Z7901 Long term (current) use of anticoagulants: Secondary | ICD-10-CM

## 2012-09-11 DIAGNOSIS — J811 Chronic pulmonary edema: Secondary | ICD-10-CM | POA: Diagnosis present

## 2012-09-11 DIAGNOSIS — I5040 Unspecified combined systolic (congestive) and diastolic (congestive) heart failure: Secondary | ICD-10-CM | POA: Diagnosis present

## 2012-09-11 DIAGNOSIS — I5023 Acute on chronic systolic (congestive) heart failure: Secondary | ICD-10-CM | POA: Diagnosis present

## 2012-09-11 DIAGNOSIS — I4891 Unspecified atrial fibrillation: Principal | ICD-10-CM | POA: Diagnosis present

## 2012-09-11 DIAGNOSIS — I428 Other cardiomyopathies: Secondary | ICD-10-CM | POA: Diagnosis present

## 2012-09-11 DIAGNOSIS — F17200 Nicotine dependence, unspecified, uncomplicated: Secondary | ICD-10-CM | POA: Diagnosis present

## 2012-09-11 DIAGNOSIS — F1010 Alcohol abuse, uncomplicated: Secondary | ICD-10-CM | POA: Diagnosis present

## 2012-09-11 DIAGNOSIS — I4892 Unspecified atrial flutter: Secondary | ICD-10-CM | POA: Diagnosis present

## 2012-09-11 DIAGNOSIS — D45 Polycythemia vera: Secondary | ICD-10-CM | POA: Diagnosis present

## 2012-09-11 DIAGNOSIS — Z91199 Patient's noncompliance with other medical treatment and regimen due to unspecified reason: Secondary | ICD-10-CM

## 2012-09-11 DIAGNOSIS — I10 Essential (primary) hypertension: Secondary | ICD-10-CM | POA: Diagnosis present

## 2012-09-11 DIAGNOSIS — Z9119 Patient's noncompliance with other medical treatment and regimen: Secondary | ICD-10-CM

## 2012-09-11 DIAGNOSIS — R791 Abnormal coagulation profile: Secondary | ICD-10-CM

## 2012-09-11 DIAGNOSIS — R0600 Dyspnea, unspecified: Secondary | ICD-10-CM

## 2012-09-11 DIAGNOSIS — I509 Heart failure, unspecified: Secondary | ICD-10-CM | POA: Diagnosis present

## 2012-09-11 LAB — GLUCOSE, CAPILLARY: Glucose-Capillary: 124 mg/dL — ABNORMAL HIGH (ref 70–99)

## 2012-09-11 LAB — CBC WITH DIFFERENTIAL/PLATELET
Eosinophils Absolute: 0.4 10*3/uL (ref 0.0–0.7)
Eosinophils Relative: 2 % (ref 0–5)
HCT: 57.5 % — ABNORMAL HIGH (ref 39.0–52.0)
Lymphocytes Relative: 10 % — ABNORMAL LOW (ref 12–46)
Lymphs Abs: 1.6 10*3/uL (ref 0.7–4.0)
MCH: 33.5 pg (ref 26.0–34.0)
MCV: 94.9 fL (ref 78.0–100.0)
Monocytes Absolute: 1 10*3/uL (ref 0.1–1.0)
RBC: 6.06 MIL/uL — ABNORMAL HIGH (ref 4.22–5.81)
WBC: 15.6 10*3/uL — ABNORMAL HIGH (ref 4.0–10.5)

## 2012-09-11 LAB — BASIC METABOLIC PANEL WITH GFR
BUN: 18 mg/dL (ref 6–23)
CO2: 25 meq/L (ref 19–32)
Calcium: 8.8 mg/dL (ref 8.4–10.5)
Chloride: 105 meq/L (ref 96–112)
Creatinine, Ser: 0.86 mg/dL (ref 0.50–1.35)
GFR calc Af Amer: >90 mL/min
GFR calc non Af Amer: 89 mL/min — ABNORMAL LOW
Glucose, Bld: 115 mg/dL — ABNORMAL HIGH (ref 70–99)
Potassium: 4.3 meq/L (ref 3.5–5.1)
Sodium: 139 meq/L (ref 135–145)

## 2012-09-11 LAB — PROTIME-INR: INR: 5.1 (ref 0.00–1.49)

## 2012-09-11 LAB — TROPONIN I: Troponin I: <0.3 ng/mL

## 2012-09-11 MED ORDER — LORAZEPAM 2 MG/ML IJ SOLN: 1.0000 mg | Freq: Four times a day (QID) | INTRAMUSCULAR | Status: DC | PRN

## 2012-09-11 MED ORDER — DILTIAZEM HCL 100 MG IV SOLR
5.0000 mg/h | INTRAVENOUS | Status: AC
  Administered 2012-09-11: 10 mg/h via INTRAVENOUS
  Filled 2012-09-11 (×2): qty 100

## 2012-09-11 MED ORDER — AMIODARONE HCL 200 MG PO TABS
200.0000 mg | ORAL_TABLET | Freq: Every day | ORAL | Status: DC
  Administered 2012-09-11 – 2012-09-13 (×3): 200 mg via ORAL
  Filled 2012-09-11 (×3): qty 1

## 2012-09-11 MED ORDER — SODIUM CHLORIDE 0.9 % IJ SOLN
3.0000 mL | Freq: Two times a day (BID) | INTRAMUSCULAR | Status: DC
  Administered 2012-09-11 – 2012-09-13 (×5): 3 mL via INTRAVENOUS

## 2012-09-11 MED ORDER — VITAMIN B-1 100 MG PO TABS
100.0000 mg | ORAL_TABLET | Freq: Every day | ORAL | Status: DC
  Administered 2012-09-11 – 2012-09-13 (×3): 100 mg via ORAL
  Filled 2012-09-11 (×3): qty 1

## 2012-09-11 MED ORDER — ADULT MULTIVITAMIN W/MINERALS CH
1.0000 | ORAL_TABLET | Freq: Every day | ORAL | Status: DC
  Administered 2012-09-11 – 2012-09-13 (×3): 1 via ORAL
  Filled 2012-09-11 (×3): qty 1

## 2012-09-11 MED ORDER — DILTIAZEM HCL ER COATED BEADS 300 MG PO CP24
300.0000 mg | ORAL_CAPSULE | Freq: Every day | ORAL | Status: DC
  Administered 2012-09-11: 300 mg via ORAL
  Filled 2012-09-11 (×2): qty 1

## 2012-09-11 MED ORDER — LORAZEPAM 0.5 MG PO TABS: 1.0000 mg | ORAL_TABLET | Freq: Four times a day (QID) | ORAL | Status: DC | PRN

## 2012-09-11 MED ORDER — FOLIC ACID 1 MG PO TABS
1.0000 mg | ORAL_TABLET | Freq: Every day | ORAL | Status: DC
  Administered 2012-09-11 – 2012-09-13 (×3): 1 mg via ORAL
  Filled 2012-09-11 (×3): qty 1

## 2012-09-11 MED ORDER — THIAMINE HCL 100 MG/ML IJ SOLN
100.0000 mg | Freq: Every day | INTRAMUSCULAR | Status: DC
  Filled 2012-09-11 (×3): qty 1

## 2012-09-11 MED ORDER — ACETAMINOPHEN 325 MG PO TABS: 650.0000 mg | ORAL_TABLET | Freq: Four times a day (QID) | ORAL | Status: DC | PRN

## 2012-09-11 MED ORDER — ACETAMINOPHEN 650 MG RE SUPP: 650.0000 mg | Freq: Four times a day (QID) | RECTAL | Status: DC | PRN

## 2012-09-11 MED ORDER — WARFARIN - PHARMACIST DOSING INPATIENT
Freq: Every day | Status: DC
  Administered 2012-09-12: 18:00:00

## 2012-09-11 MED ORDER — DILTIAZEM HCL 100 MG IV SOLR
5.0000 mg/h | Freq: Once | INTRAVENOUS | Status: AC
  Administered 2012-09-11: 10 mg/h via INTRAVENOUS

## 2012-09-11 NOTE — Progress Notes (Signed)
ANTICOAGULATION CONSULT NOTE - Initial Consult  Pharmacy Consult for Warfarin  Indication: atrial fibrillation  Allergies  Allergen Reactions  . Codeine Rash    Patient Measurements: Height: 5\' 6"  (167.6 cm) Weight: 183 lb 10.3 oz (83.3 kg) IBW/kg (Calculated) : 63.8  Vital Signs: Temp: 98 F (36.7 C) (02/23 0919) Temp src: Oral (02/23 0919) BP: 134/88 mmHg (02/23 0919) Pulse Rate: 76 (02/23 0919)  Labs:  Recent Labs  09/11/12 0506 09/11/12 0508  HGB 20.3*  --   HCT 57.5*  --   PLT 316  --   LABPROT 43.8*  --   INR 5.10*  --   CREATININE 0.86  --   TROPONINI  --  <0.30    Estimated Creatinine Clearance: 86.7 ml/min (by C-G formula based on Cr of 0.86).   Medical History: Past Medical History  Diagnosis Date  . Cardiomyopathy   . Substance abuse     alcohol  . Hypertension   . Atrial flutter      status post ablation on 09/11/2010  . CHF (congestive heart failure)     EF 35% with diffuse hypokinesis    Medications:  Prescriptions prior to admission  Medication Sig Dispense Refill  . amiodarone (PACERONE) 200 MG tablet Take 1 tablet (200 mg total) by mouth daily.  30 tablet  2  . carvedilol (COREG) 3.125 MG tablet Take 1 tablet (3.125 mg total) by mouth 2 (two) times daily with a meal.  60 tablet  2  . lisinopril (PRINIVIL,ZESTRIL) 5 MG tablet Take 1 tablet (5 mg total) by mouth daily.  30 tablet  2  . warfarin (COUMADIN) 5 MG tablet Take 1 tablet (5 mg total) by mouth daily.  30 tablet  1    Assessment: 67 y/o of warfarin as outpatient for A-fib who presents to the hospital with SUPRA-therapeutic INR of 5.10. Apparently was supposed to f/u with Dr. Alexandria Lodge, but I don't see any documentation of this. CBC is elevated with Hgb of 20.3. Renal function stable. Will hold warfarin for now.   PTA Warfarin Dose: 5mg /day  Goal of Therapy:  INR 2-3 Monitor platelets by anticoagulation protocol: Yes   Plan:  - HOLD warfarin for now - Daily PT/INR - Monitor  closely for bleeding  Thank you for the consult,   Abran Duke, PharmD Clinical Pharmacist Phone: [phone] Pager: [phone] 09/11/2012 9:41 AM

## 2012-09-11 NOTE — ED Provider Notes (Signed)
History     CSN: [id number]  Arrival date & time 09/11/12  0455   First MD Initiated Contact with Patient 09/11/12 [phone]      Chief Complaint  Patient presents with  . Shortness of Breath    (Consider location/radiation/quality/duration/timing/severity/associated sxs/prior treatment) HPI 67 year old male presents to the emergency department from home with complaint of shortness of breath.  Patient recently seen and admitted on the 11th of this month for atrial fibrillation with RVR.  He was discharged later the same day.  He reports he is taking all his medications as prescribed.  Per note, patient was sent home with rate controlled A. fib.  He is on Coumadin.  He denies any chest pain.  He reports since being discharged he has had increasing shortness of breath which worsened overnight.  No prior history of CHF or COPD. Past Medical History  Diagnosis Date  . Cardiomyopathy   . Substance abuse     alcohol  . Hypertension   . Atrial flutter      status post ablation on 09/11/2010  . CHF (congestive heart failure)     EF 35% with diffuse hypokinesis    Past Surgical History  Procedure Laterality Date  . Hemorrhoid surgery      Family History  Problem Relation Age of Onset  . Diabetes Mother   . Hypertension Mother   . Cirrhosis Father   . Alcohol abuse Father     History  Substance Use Topics  . Smoking status: Current Some Day Smoker -- 1.00 packs/day for 40 years    Types: Cigarettes  . Smokeless tobacco: Never Used  . Alcohol Use: 2.0 oz/week    4 drink(s) per week      Review of Systems  All other systems reviewed and are negative.    Allergies  Codeine  Home Medications   Current Outpatient Rx  Name  Route  Sig  Dispense  Refill  . amiodarone (PACERONE) 200 MG tablet   Oral   Take 1 tablet (200 mg total) by mouth daily.   30 tablet   2   . carvedilol (COREG) 3.125 MG tablet   Oral   Take 1 tablet (3.125 mg total) by mouth 2 (two) times daily  with a meal.   60 tablet   2   . lisinopril (PRINIVIL,ZESTRIL) 5 MG tablet   Oral   Take 1 tablet (5 mg total) by mouth daily.   30 tablet   2   . warfarin (COUMADIN) 5 MG tablet   Oral   Take 1 tablet (5 mg total) by mouth daily.   30 tablet   1     BP 146/112  Pulse 122  SpO2 98%  Physical Exam  Nursing note and vitals reviewed. Constitutional: He is oriented to person, place, and time. He appears distressed (mild respiratory distress).  HENT:  Head: Normocephalic and atraumatic.  Nose: Nose normal.  Mouth/Throat: Oropharynx is clear and moist.  Neck: Normal range of motion. Neck supple. No JVD present. No tracheal deviation present. No thyromegaly present.  Cardiovascular: Normal heart sounds and intact distal pulses.  Exam reveals no gallop and no friction rub.   No murmur heard. Tachycardia with irregular irregular rhythm  Pulmonary/Chest: No stridor. He is in respiratory distress (mild). He has no wheezes. He has no rales. He exhibits no tenderness.  Tachypnea, some accessory muscle use.  No wheezing noted  Abdominal: Soft. Bowel sounds are normal. He exhibits no distension and no  mass. There is no tenderness. There is no rebound and no guarding.  Musculoskeletal: He exhibits edema. He exhibits no tenderness.  Lymphadenopathy:    He has no cervical adenopathy.  Neurological: He is alert and oriented to person, place, and time.  Skin: Skin is warm and dry. No rash noted. No erythema. No pallor.  Psychiatric: He has a normal mood and affect. His behavior is normal. Judgment and thought content normal.    ED Course  Procedures (including critical care time)  Labs Reviewed  CBC WITH DIFFERENTIAL - Abnormal; Notable for the following:    WBC 15.6 (*)    RBC 6.06 (*)    Hemoglobin 20.3 (*)    HCT 57.5 (*)    RDW 16.3 (*)    Neutrophils Relative 81 (*)    Neutro Abs 12.6 (*)    Lymphocytes Relative 10 (*)    All other components within normal limits  PRO B  NATRIURETIC PEPTIDE - Abnormal; Notable for the following:    Pro B Natriuretic peptide (BNP) 6353.0 (*)    All other components within normal limits  PROTIME-INR - Abnormal; Notable for the following:    Prothrombin Time 43.8 (*)    INR 5.10 (*)    All other components within normal limits  BASIC METABOLIC PANEL - Abnormal; Notable for the following:    Glucose, Bld 115 (*)    GFR calc non Af Amer 89 (*)    All other components within normal limits  TROPONIN I   Dg Chest Portable 1 View  09/11/2012  *RADIOLOGY REPORT*  Clinical Data: Shortness of breath.  PORTABLE CHEST - 1 VIEW  Comparison: 08/30/2012  Findings: Shallow inspiration.  Cardiac enlargement with pulmonary vascular congestion.  Increasing perihilar and right basilar infiltration suggesting developing edema.  No blunting of costophrenic angles.  No pneumothorax.  Mediastinal contours appear intact.  IMPRESSION: Mild cardiac enlargement with developing pulmonary vascular congestion and perihilar edema since previous study.   Original Report Authenticated By: Burman Nieves, M.D.      Date: 09/11/2012  Rate:138  Rhythm: atrial fibrillation  QRS Axis: normal  Intervals: afib  ST/T Wave abnormalities: ST depressions laterally  Conduction Disutrbances:afib  Narrative Interpretation:   Old EKG Reviewed: unchanged    1. Atrial fibrillation with RVR   2. Pulmonary edema   3. Dyspnea   4. Supratherapeutic INR   5. Anticoagulated on Coumadin       MDM  67 year old male with atrial fibrillation with RVR.  He is symptomatic with shortness of breath.  Recent data for same.  He denies missing any doses of his medications.  We'll start diltiazem drip as that helped with his rate last visit, get baseline labs and chest x-ray.  Will discuss with outpatient clinics for readmission.        Olivia Mackie, MD 09/11/12 [id number]

## 2012-09-11 NOTE — ED Notes (Signed)
Pt. Having sob all day yesterday. No hx. Of asthma and copd.

## 2012-09-11 NOTE — ED Notes (Signed)
INR called from lab and relayed to Dr. Verlee Monte

## 2012-09-11 NOTE — H&P (Signed)
Hospital Admission Note Date: 09/11/2012  Patient name: [name] Medical record number: [id number] Date of birth: 1946/02/27 Age: 67 y.o. Gender: male PCP: Pcp Not In System  _________________________________________________________ INTERNAL MEDICINE TEACHING SERVICE CONTACT INFO     Weekday Hours (7AM-5PM): ** If no return call within 15 minutes (after trying both pagers listed below), please call after hours pagers.    First Contact:  Dr. Collier Bullock   Pager:  [phone] Second Contact:   Dr. Manson Passey   Pager:  [phone]      After Hours (after 5PM)/ Weekend / Holidays: First Contact:              Pager: [phone] Second Contact:         Pager: [phone] _________________________________________________________   Chief Complaint:  Shortness of breath  History of Present Illness:  Patient is a 67 year old gentleman with a history of hypertension, atrial fibrillation, alcohol and tobacco use, noncompliance with medications, recently admitted for AFib with RVR on 08/30/12 and sent home on coumadin and rate control on 09/01/12. He presents with worsening shortness of breath.  He states that he had been doing well until the day before admission when he started getting more short of breath and felt like he couldn't breath well. It continued to get worse and he wasn't able to sleep so he came to the ED. He denies any chest pain, palpitations, LE edema, headache.  He states that he has been taking his medication since discharge without missing any doses.  He smokes ~4 cigarettes per day for years and binge drinking "about a 12-pack" every few weeks. Denies any drinking the day of admission.  Family hx significant for HTN in mother, father died of cirrhosis.  Meds:   Medication List    ASK your doctor about these medications       amiodarone 200 MG tablet  Commonly known as:  PACERONE  Take 1 tablet (200 mg total) by mouth daily.     carvedilol 3.125 MG tablet  Commonly known as:   COREG  Take 1 tablet (3.125 mg total) by mouth 2 (two) times daily with a meal.     lisinopril 5 MG tablet  Commonly known as:  PRINIVIL,ZESTRIL  Take 1 tablet (5 mg total) by mouth daily.     warfarin 5 MG tablet  Commonly known as:  COUMADIN  Take 1 tablet (5 mg total) by mouth daily.        Allergies: Allergies as of 09/11/2012 - Review Complete 09/11/2012  Allergen Reaction Noted  . Codeine Rash 09/30/2010   Past Medical History  Diagnosis Date  . Cardiomyopathy   . Substance abuse     alcohol  . Hypertension   . Atrial flutter      status post ablation on 09/11/2010  . CHF (congestive heart failure)     EF 35% with diffuse hypokinesis   Past Surgical History  Procedure Laterality Date  . Hemorrhoid surgery     Family History  Problem Relation Age of Onset  . Diabetes Mother   . Hypertension Mother   . Cirrhosis Father   . Alcohol abuse Father    History   Social History  . Marital Status: Single    Spouse Name: N/A    Number of Children: N/A  . Years of Education: N/A   Occupational History  . Not on file.   Social History Main Topics  . Smoking status: Current Some Day Smoker -- 1.00 packs/day for 40 years  Types: Cigarettes  . Smokeless tobacco: Never Used  . Alcohol Use: 2.0 oz/week    4 drink(s) per week  . Drug Use: No     Comment: former coacaine user  . Sexually Active: Yes   Other Topics Concern  . Not on file   Social History Narrative   Moved here from [place]. Lives with common law wife and grandchildren.    Review of Systems: Pertinent items noted in HPI  Physical Exam Blood pressure 117/81, pulse 68, SpO2 96.00%. General:  No acute distress, alert and oriented x 3, sitting comfortably in bed HEENT:  PERRL, EOMI, moist mucous membranes Cardiovascular:  Irregularly irregular, rate approx 80, no murmurs  Respiratory:  Clear to auscultation bilaterally, no rales, or rhonchi, mild faint exp wheezing, good effort Abdomen:   Soft, distended, nontender, bowel sounds present, no organomegaly appreciated Extremities:  Warm and well-perfused, trace edema.  Skin: Warm, dry, no rashes Neuro: Not anxious appearing, no depressed mood, normal affect  Lab results: Basic Metabolic Panel:  Recent Labs  09/81/19 0506  NA 139  K 4.3  CL 105  CO2 25  GLUCOSE 115*  BUN 18  CREATININE 0.86  CALCIUM 8.8   CBC:  Recent Labs  09/11/12 0506  WBC 15.6*  NEUTROABS 12.6*  HGB 20.3*  HCT 57.5*  MCV 94.9  PLT 316   BNP:  Recent Labs  09/11/12 0508  PROBNP 6353.0*   Urine Drug Screen: Drugs of Abuse     Component Value Date/Time   LABOPIA POSITIVE* 09/07/2010 0910   COCAINSCRNUR NONE DETECTED 09/07/2010 0910   LABBENZ NONE DETECTED 09/07/2010 0910   AMPHETMU NONE DETECTED 09/07/2010 0910   THCU NONE DETECTED 09/07/2010 0910   LABBARB  Value: NONE DETECTED        DRUG SCREEN FOR MEDICAL PURPOSES ONLY.  IF CONFIRMATION IS NEEDED FOR ANY PURPOSE, NOTIFY LAB WITHIN 5 DAYS.        LOWEST DETECTABLE LIMITS FOR URINE DRUG SCREEN Drug Class       Cutoff (ng/mL) Amphetamine      1000 Barbiturate      200 Benzodiazepine   200 Tricyclics       300 Opiates          300 Cocaine          300 THC              50 09/07/2010 0910      Imaging results:  Dg Chest Portable 1 View  09/11/2012  *RADIOLOGY REPORT*  Clinical Data: Shortness of breath.  PORTABLE CHEST - 1 VIEW  Comparison: 08/30/2012  Findings: Shallow inspiration.  Cardiac enlargement with pulmonary vascular congestion.  Increasing perihilar and right basilar infiltration suggesting developing edema.  No blunting of costophrenic angles.  No pneumothorax.  Mediastinal contours appear intact.  IMPRESSION: Mild cardiac enlargement with developing pulmonary vascular congestion and perihilar edema since previous study.   Original Report Authenticated By: Burman Nieves, M.D.     Other results: EKG: Afib, rate ~130s  Assessment & Plan by Problem: Active Problems:   * No  active hospital problems. *   Atrial Fibrillation with RVR Patient's HR elevated to 140s in ED, EKG showed AFib with RVR. Patient with history of the same s/p ablation and cardioversion with recurrent Afib, recently placed on coumadin and rate control after not being on anticoagulation or other therapy for 2 years. Shortness of breath likely due to atrial fibrillation with RVR. Last ECHO during last admission was  08/31/12 with EF 40-45%, mod LVH, mild diffuse hypokinesis, mild aortic regurg, small pericardial effusion, mod-severe LA dilation. Patient reports compliance with Coreg, so we may switch rate control agent to Diltiazem or consider increased Coreg dose. Started on Diltiazem drip in the ED, rate decreased to 70's and patient's SOB much improved.  -admit to telemetry -cont diltiazem drip, transition to PO diltiazem this afternoon -cont amiodarone 200 mg daily -cont anticoagulation as below  Supratherapeutic INR INR 5.1 on admission, no evidence of active bleeding. Hgb stable, 20. -daily INR, monitor for bleeding -cont coumadin, dosing per pharmacy  Idiopathic Cardiomyopathy Recent ECHO results as above. Thought to be related to etoh vs tachycardia. CXR with pulmonary vascular congestion and perihilar edema though no crackles on exam and resp symptoms improved with dilt drip. -treat AF RVR as above  Polycythemia Patient with chronic polycythemia, noted on previous admission. Chronic, unchanged. Unclear etiology, possiblly related to smoking, sleep apnea, or ?COPD.  -cont to monitor  HTN Blood pressure normal. On Dilt drip. -hold lisinopril and Coreg  Alcohol and tobacco abuse -cessation counseling -CIWA protocol, folic acid, thiamine, multivitamin  DVT PPX -pt supratherapeutic on coumadin  Dispo -pending further improvement -anticipate dc in 1-2 days   Signed: Denton Ar 09/11/2012, 8:53 AM

## 2012-09-12 ENCOUNTER — Ambulatory Visit: Payer: Medicare Other

## 2012-09-12 ENCOUNTER — Ambulatory Visit: Payer: Medicare Other | Admitting: Internal Medicine

## 2012-09-12 DIAGNOSIS — I4891 Unspecified atrial fibrillation: Principal | ICD-10-CM

## 2012-09-12 LAB — PROTIME-INR: INR: 2.98 — ABNORMAL HIGH (ref 0.00–1.49)

## 2012-09-12 LAB — COMPREHENSIVE METABOLIC PANEL
ALT: 72 U/L — ABNORMAL HIGH (ref 0–53)
AST: 54 U/L — ABNORMAL HIGH (ref 0–37)
Albumin: 2.8 g/dL — ABNORMAL LOW (ref 3.5–5.2)
CO2: 28 mEq/L (ref 19–32)
Calcium: 8.3 mg/dL — ABNORMAL LOW (ref 8.4–10.5)
Creatinine, Ser: 0.91 mg/dL (ref 0.50–1.35)
Sodium: 139 mEq/L (ref 135–145)
Total Protein: 6.4 g/dL (ref 6.0–8.3)

## 2012-09-12 LAB — PHOSPHORUS: Phosphorus: 3.4 mg/dL (ref 2.3–4.6)

## 2012-09-12 LAB — CBC
Hemoglobin: 18.3 g/dL — ABNORMAL HIGH (ref 13.0–17.0)
MCH: 33.1 pg (ref 26.0–34.0)
MCHC: 34.3 g/dL (ref 30.0–36.0)
Platelets: 276 10*3/uL (ref 150–400)

## 2012-09-12 MED ORDER — CARVEDILOL 6.25 MG PO TABS
6.2500 mg | ORAL_TABLET | Freq: Two times a day (BID) | ORAL | Status: DC
  Administered 2012-09-12 – 2012-09-13 (×2): 6.25 mg via ORAL
  Filled 2012-09-12 (×4): qty 1

## 2012-09-12 MED ORDER — WARFARIN SODIUM 2.5 MG PO TABS
2.5000 mg | ORAL_TABLET | Freq: Once | ORAL | Status: AC
  Administered 2012-09-12: 2.5 mg via ORAL
  Filled 2012-09-12: qty 1

## 2012-09-12 MED ORDER — FUROSEMIDE 10 MG/ML IJ SOLN
40.0000 mg | Freq: Once | INTRAMUSCULAR | Status: AC
  Administered 2012-09-12: 40 mg via INTRAVENOUS
  Filled 2012-09-12: qty 4

## 2012-09-12 MED ORDER — FUROSEMIDE 10 MG/ML IJ SOLN
INTRAMUSCULAR | Status: AC
  Filled 2012-09-12: qty 4

## 2012-09-12 NOTE — H&P (Signed)
Internal Medicine Attending Admission Note Date: 09/12/2012  Patient name: [name] Medical record number: [id number] Date of birth: [birth year] Age: 67 y.o. Gender: male  I saw and evaluated the patient. I reviewed the resident's note and I agree with the resident's findings and plan as documented in the resident's note.  Chief Complaint(s): Gradually worsening shortness of breath over the last 48 hours.  History - key components related to admission:  Mr.: [name] is a 67 year old man with a history of atrial fibrillation, hypertension, tobacco abuse, and alcohol abuse who presents with a two-day history of gradually worsening shortness of breath. He was recently admitted to the internal medicine teaching service earlier this month with atrial fibrillation and rapid ventricular rate. This was found to be paroxysmal. He was therefore started on amiodarone, low-dose Coreg, and lisinopril in addition to warfarin. Upon discharge he was feeling markedly improved with rate control. He states he's been compliant with his medications and had done well up until 48 hours prior to admission when the shortness of breath gradually began to worsen. He also developed orthopnea and paroxysmal nocturnal dyspnea. He denied any chest pain, diaphoresis, dizziness, or nausea. He did not feel any palpitations or tachycardia. He has not had any recent sick contacts and denies any fevers. He also denies any binge drinking recently.  Since admission his rate has been controlled and his symptoms have markedly improved. He is without any other complaints at this time.  Physical Exam - key components related to admission:  Filed Vitals:   09/11/12 2020 09/11/12 2300 09/12/12 0531 09/12/12 0959  BP: 122/98 139/83 113/81 109/79  Pulse: 67 70 67 77  Temp: 98.4 F (36.9 C)  97.5 F (36.4 C)   TempSrc: Oral  Oral   Resp: 20  20   Height:      Weight:   187 lb 9.6 oz (85.095 kg)   SpO2: 99%  96%    General:  Well-developed, well-nourished, man lying flat in bed in no acute distress. Lungs: Diffuse end expiratory wheezes with occasional bibasilar inspiratory crackles. Heart: Irregularly irregular, regular rate, without murmurs, rubs, or gallops appreciated. Abdomen: Soft, nontender, active bowel sounds. Extremities: Without edema.  Lab results:  Basic Metabolic Panel:  Recent Labs  40/98/11 0506 09/11/12 0925 09/12/12 0611  NA 139  --  139  K 4.3  --  4.1  CL 105  --  104  CO2 25  --  28  GLUCOSE 115*  --  111*  BUN 18  --  19  CREATININE 0.86  --  0.91  CALCIUM 8.8  --  8.3*  MG  --  1.9  --   PHOS  --   --  3.4   Liver Function Tests:  Recent Labs  09/12/12 0611  AST 54*  ALT 72*  ALKPHOS 187*  BILITOT 0.7  PROT 6.4  ALBUMIN 2.8*   CBC:  Recent Labs  09/11/12 0506 09/12/12 0611  WBC 15.6* 15.5*  NEUTROABS 12.6*  --   HGB 20.3* 18.3*  HCT 57.5* 53.3*  MCV 94.9 96.4  PLT 316 276   Cardiac Enzymes:  Recent Labs  09/11/12 0508  TROPONINI <0.30   CBG:  Recent Labs  09/11/12 0932 09/11/12 1648  GLUCAP 119* 124*   Coagulation:  Recent Labs  09/11/12 0506 09/12/12 0841  INR 5.10* 2.98*   Misc. Labs:  BNP 6353  Imaging results:  Dg Chest Portable 1 View  09/11/2012  *RADIOLOGY REPORT*  Clinical Data: Shortness  of breath.  PORTABLE CHEST - 1 VIEW  Comparison: 08/30/2012  Findings: Shallow inspiration.  Cardiac enlargement with pulmonary vascular congestion.  Increasing perihilar and right basilar infiltration suggesting developing edema.  No blunting of costophrenic angles.  No pneumothorax.  Mediastinal contours appear intact.  IMPRESSION: Mild cardiac enlargement with developing pulmonary vascular congestion and perihilar edema since previous study.   Original Report Authenticated By: Burman Nieves, M.D.    Other results:  EKG: Atrial fibrillation at 70 beats per minute, normal axis, normal intervals, no significant Q waves, no LVH, good R  wave progression, nonspecific ST-T changes, unchanged from the previous ECG on 08/30/2012.  Assessment & Plan by Problem:  [name] is a 67 year old man with a history of atrial fibrillation, hypertension, tobacco abuse, and alcohol abuse who presents with symptomatic atrial fibrillation with rapid ventricular rate. He apparently has been compliant with his medications, especially in light of his elevated INR suggesting at least he's taking his Coumadin. It's quite likely that the dose of the Coreg was not high enough for rate control. No other precipitating cause is currently apparent.  1) Atrial fibrillation with rapid ventricular rate: The patient's symptoms have resolved with control of his ventricular rate. As he has an underlying cardiomyopathy that is likely tachycardic in nature we are leaning toward using a beta blocker for rate control. We will therefore double his Coreg dose to 6.25 mg by mouth twice a day in hopes of achieving better rate control when he is in atrial fibrillation. We will also give a single dose of intravenous Lasix for mild diuresis given his orthopnea, paroxysmal nocturnal dyspnea, small right-sided pleural effusion, and curly B-lines seen on chest x-ray. I suspect good rate control will avert any decompensation of his cardiomyopathy. If his rate remains controlled and he diureses nicely I suspect he'll be ready for discharge home tomorrow morning. He should have followup in the [facility] before the weekend to assure his rate is controlled and there is no need for any further adjustments in his beta blockade.  2) Disposition: I anticipate he will be ready for discharge tomorrow as noted above. Quick followup in the [facility] Medicine Center will be necessary.

## 2012-09-12 NOTE — Clinical Documentation Improvement (Signed)
CHF DOCUMENTATION CLARIFICATION QUERY  CLINICAL DOCUMENTATION QUERIES ARE NOT PART OF THE PERMANENT MEDICAL RECORD    Please update your documentation within the medical record to reflect your response to this query.                                                                                     09/12/12  Dr. Collier Bullock and/or Associates,  In a better effort to capture your patient's severity of illness, reflect appropriate length of stay and utilization of resources, a review of the patient medical record has revealed the following indicators:   - Known history of Idiopathic Cardiomyopathy, possibly tachycardia induced and/or alcohol induced.  CXR this admission 09/11/12 Comparison: 08/30/2012  Findings: Shallow inspiration. Cardiac enlargement with pulmonary vascular congestion. Increasing perihilar and right basilar infiltration suggesting developing edema. No blunting of costophrenic angles. No pneumothorax. Mediastinal contours appear intact.  IMPRESSION:  Mild cardiac enlargement with developing pulmonary vascular congestion and perihilar edema since previous study.  Original Report Authenticated By: Burman Nieves, M.D.  Treated with IV Lasix this admission: "We will also give a single dose of intravenous Lasix for mild diuresis given his orthopnea, paroxysmal nocturnal dyspnea, small right-sided pleural effusion, and curly B-lines seen on chest x-ray" H&P signed by [name], MD at 09/12/2012 10:40 AM  Home Medications Include: Lisinopril Amiodarone Carvedilol Coumadin  Echo last Admission 08/31/12 Study Conclusions - Left ventricle: Wall thickness was increased in a pattern of moderate LVH. Systolic function was mildly to moderately reduced. The estimated ejection fraction was inthe range of 40% to 45%. Mild diffuse hypokinesis. - Aortic valve: Mild regurgitation. - Left atrium: The atrium was moderately to severely dilated. - Pericardium, extracardiac: A small  pericardial effusion was identified.   Based on your clinical judgment, please document in the progress notes and discharge summary if the patient was treated for:   - Acute on Chronic Systolic Heart Failure   - Other Acuity and Type of Heart Failure   - Unable to Clinically Determine   In responding to this query please exercise your independent judgment.    The fact that a query is asked, does not imply that any particular answer is desired or expected.   Reviewed: additional documentation in the medical record  Thank You,  Jerral Ralph  RN BSN CCDS Certified Clinical Documentation Specialist: Cell   [phone]  Health Information Management [place]  TO RESPOND TO THE THIS QUERY, FOLLOW THE INSTRUCTIONS BELOW:  1. If needed, update documentation for the patient's encounter via the notes activity.  2. Access this query again and click edit on the In [employer].  3. After updating, or not, click F2 to complete all highlighted (required) fields concerning your review. Select "additional documentation in the medical record" OR "no additional documentation provided".  4. Click Sign note button.  5. The deficiency will fall out of your In Basket *Please let us know if you are not able to complete this workflow by phone or e-mail (listed below).

## 2012-09-12 NOTE — Discharge Summary (Signed)
Internal Medicine Teaching [facility] Discharge Note  Name: [name] MRN: [id number] DOB: 1946-05-10 67 y.o.  Date of Admission: 09/11/2012  4:59 AM Date of Discharge: 09/13/2012 Attending Physician: [name], MD  Discharge Diagnosis: 1. Atrial Fibrillation with RVR - rate controlled with dilt -> coreg 2. 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 Polycythemia - chronic, unchanged  Discharge Medications:   Medication List    ASK your doctor about these medications       amiodarone 200 MG tablet  Commonly known as:  PACERONE  Take 1 tablet (200 mg total) by mouth daily.     carvedilol 3.125 MG tablet  Commonly known as:  COREG  Take 1 tablet (3.125 mg total) by mouth 2 (two) times daily with a meal.     lisinopril 5 MG tablet  Commonly known as:  PRINIVIL,ZESTRIL  Take 1 tablet (5 mg total) by mouth daily.     warfarin 5 MG tablet  Commonly known as:  COUMADIN  Take 1 tablet (5 mg total) by mouth daily.        Disposition and follow-up:   [name] was discharged from [facility] in stable and improved condition, with control of HR down to 70-80's.  At hospital follow-up, please address the following:  - follow up with Dr. Alexandria Lodge for Coumadin - adjust Coreg dose as needed for heart rate <110 -assess compliance with medications   Follow-up Appointments:  Future Appointments Provider Department Dept Phone   09/15/2012 10:15 AM Ky Barban, MD [facility] [phone]   09/16/2012 10:00 AM [facility]  [facility] [phone]         Procedures Performed:  Dg Chest Port 1 View  08/30/2012  *RADIOLOGY REPORT*  Clinical Data: Shortness of breath.  PORTABLE CHEST - 1 VIEW  Comparison: Two-view chest 09/07/2010.  Findings: Borderline cardiomegaly is present.  Mild pulmonary vascular congestion is evident.  There is an  underlying component chronic interstitial coarsening.  No significant airspace consolidation is present.  The visualized soft tissues and bony thorax are unremarkable.  IMPRESSION:  1.  Borderline cardiomegaly and mild pulmonary vascular congestion. 2.  A component of chronic interstitial coarsening is noted as well.   Original Report Authenticated By: [name], M.D.     2D Echo:  - Left ventricle: Wall thickness was increased in a pattern of moderate LVH. Systolic function was mildly to moderately reduced. The estimated ejection fraction was in the range of 40% to 45%. Mild diffuse hypokinesis. - Aortic valve: Mild regurgitation. - Left atrium: The atrium was moderately to severely dilated. - Pericardium, extracardiac: A small pericardial effusion was identified.  Admission HPI:  Patient is a 67 year old gentleman with a history of hypertension, atrial fibrillation, alcohol and tobacco use, noncompliance with medications, recently admitted for AFib with RVR on 08/30/12 and sent home on coumadin and rate control on 09/01/12. He presents with worsening shortness of breath.  He states that he had been doing well until the day before admission when he started getting more short of breath and felt like he couldn't breath well. It continued to get worse and he wasn't able to sleep so he came to the ED. He denies any chest pain, palpitations, LE edema, headache.  He states that he has been taking his medication since discharge without missing any doses.  He smokes ~4 cigarettes per day for years and binge drinking "about  a 12-pack" every few weeks. Denies any drinking the day of admission.  Family hx significant for HTN in mother, father died of cirrhosis.   Admission Physical Exam  Blood pressure 117/81, pulse 68, SpO2 96.00%.  General: No acute distress, alert and oriented x 3, sitting comfortably in bed  HEENT: PERRL, EOMI, moist mucous membranes  Cardiovascular: Irregularly  irregular, rate approx 80, no murmurs  Respiratory: Clear to auscultation bilaterally, no rales, or rhonchi, mild faint exp wheezing, good effort  Abdomen: Soft, distended, nontender, bowel sounds present, no organomegaly appreciated  Extremities: Warm and well-perfused, trace edema.  Skin: Warm, dry, no rashes  Neuro: Not anxious appearing, no depressed mood, normal affect   Hospital Course by problem list:  Atrial Fibrillation with RVR - The patient has a history of atrial fibrillation, with prior ablation 09/11/10, presenting again with afib, found to have heart rate of 180 on admission.  The patient had previously been prescribed amiodraone, pradaxa, coreg, and lisinopril after 2012 hospitalization, but presented non-compliant with all medications for at least 1 year until the last admission. He was discharged on 2/13 with amiodarone, coumadin, Coreg, and lisinopril but did not make it to follow up appointments due to weather. The patient was placed on a diltiazem drip, with lowering of his heart rate to the 80's.  He was then converted to PO coreg 6.25 and continued on amiodarone. Coreg was increased to 12.5mg  BID before discharge  If patient cannot show compliance with INR checks on warfarin, aspirin may be an acceptable alternative in the future. He was sent home with close follow up in the [facility] and will need titration of Coreg dose to keep HR <110.  Acute on Chronic SHF - The patient has a history of heart failure, with variable EF's on past echos, thought to be secondary to his afib with tachycardia.  Repeat echo during last admission showed an EF of 40-45%. CXR on admission showed evidence of pulmonary congestion with kerly b lines. Patient was given one dose of Lasix IV for diuresis which he responded well to. The patient was continued on coreg and lisinopril, which he should continue as an outpatient.  Elevated INR - patient presented with an INR greater than 5, no evidence of bleeding and  stable Hgb. His Coumadin was held and his INR decreased to 1.9 the day of discharge. Discussed with the pharmacy team and Dr. Alexandria Lodge with the outpatient clinic and the patient was sent home with 5 mg warfarin the day of discharge, 5 mg on 2/26, 2.5 mg on 2/27 and INR check on Friday Dr. Alexandria Lodge.  Time spent on discharge: 35 minutes  Discharge Vitals:  BP 102/62  Pulse 81  Temp(Src) 97.4 F (36.3 C) (Oral)  Resp 20  Ht 5\' 6"  (1.676 m)  Wt 182 lb 4.8 oz (82.691 kg)  BMI 29.44 kg/m2  SpO2 98%  Discharge Labs:  Results for orders placed during the hospital encounter of 09/11/12 (from the past 24 hour(s))  PROTIME-INR     Status: Abnormal   Collection Time    09/13/12  7:02 AM      Result Value Range   Prothrombin Time 21.3 (*) 11.6 - 15.2 seconds   INR 1.93 (*) 0.00 - 1.49    Signed: Denton Ar 09/13/2012, 11:03 AM

## 2012-09-12 NOTE — Progress Notes (Signed)
ANTICOAGULATION CONSULT NOTE - Follow Up Consult  Pharmacy Consult for warfarin Indication: atrial fibrillation  Allergies  Allergen Reactions  . Codeine Rash    Patient Measurements: Height: 5\' 6"  (167.6 cm) Weight: 187 lb 9.6 oz (85.095 kg) IBW/kg (Calculated) : 63.8  Vital Signs: Temp: 97.5 F (36.4 C) (02/24 0531) Temp src: Oral (02/24 0531) BP: 109/79 mmHg (02/24 0959) Pulse Rate: 77 (02/24 0959)  Labs:  Recent Labs  09/11/12 0506 09/11/12 0508 09/12/12 0611 09/12/12 0841  HGB 20.3*  --  18.3*  --   HCT 57.5*  --  53.3*  --   PLT 316  --  276  --   LABPROT 43.8*  --   --  29.4*  INR 5.10*  --   --  2.98*  CREATININE 0.86  --  0.91  --   TROPONINI  --  <0.30  --   --     Estimated Creatinine Clearance: 82.8 ml/min (by C-G formula based on Cr of 0.91).   Medications:  Scheduled:  . amiodarone  200 mg Oral Daily  . carvedilol  6.25 mg Oral BID WC  . folic acid  1 mg Oral Daily  . furosemide      . [COMPLETED] furosemide  40 mg Intravenous Once  . multivitamin with minerals  1 tablet Oral Daily  . sodium chloride  3 mL Intravenous Q12H  . thiamine  100 mg Oral Daily   Or  . thiamine  100 mg Intravenous Daily  . Warfarin - Pharmacist Dosing Inpatient   Does not apply q1800  . [DISCONTINUED] diltiazem  300 mg Oral Daily    Assessment: 67 y/o of warfarin as outpatient for A-fib who presented to the hospital on 09/11/12 with a SUPRA-therapeutic INR of 5.10. INR today= 2.98  Home coumadin dose: 5mg /day  Goal of Therapy:  INR 2-3 Monitor platelets by anticoagulation protocol: Yes   Plan:  -Will give coumadin 2.5mg  today -Daily PT/INR  Harland German, Pharm D 09/12/2012 12:29 PM

## 2012-09-12 NOTE — Progress Notes (Signed)
Pt's HR went down to 46 and earlier 42 but non-sustained BP 139/83,HR 70 no s/s, will continue to monitor, Thanks Lavonda Jumbo RN

## 2012-09-12 NOTE — Progress Notes (Signed)
Subjective: Patient states that he is doing "fine" this morning. No chest pain, palpitations. He feels his breathing is better. Denies N/V.  Objective: Vital signs in last 24 hours: Filed Vitals:   09/11/12 2020 09/11/12 2300 09/12/12 0531 09/12/12 0959  BP: 122/98 139/83 113/81 109/79  Pulse: 67 70 67 77  Temp: 98.4 F (36.9 C)  97.5 F (36.4 C)   TempSrc: Oral  Oral   Resp: 20  20   Height:      Weight:   187 lb 9.6 oz (85.095 kg)   SpO2: 99%  96%    Weight change:   Intake/Output Summary (Last 24 hours) at 09/12/12 1123 Last data filed at 09/12/12 0958  Gross per 24 hour  Intake  947.5 ml  Output    250 ml  Net  697.5 ml    Physical Exam Blood pressure 109/79, pulse 77, temperature 97.5 F (36.4 C), temperature source Oral, resp. rate 20, height 5\' 6"  (1.676 m), weight 187 lb 9.6 oz (85.095 kg), SpO2 96.00%. General:  No acute distress, alert and oriented x 3, well-appearing  HEENT:  PERRL, EOMI, moist mucous membranes Cardiovascular: Irregularly irregular rhythm, normal rate, no murmurs Respiratory:  Clear to auscultation bilaterally, mild exp wheezes, rare crackles, no rhonchi Abdomen:  Soft, nondistended, nontender, bowel sounds present Extremities:  Warm and well-perfused, trace edema, no calf tenderness Skin: Warm, dry, no rashes Neuro: Not anxious appearing, no depressed mood, normal affect  Lab Results: Basic Metabolic Panel:  Recent Labs Lab 09/11/12 0506 09/11/12 0925 09/12/12 0611  NA 139  --  139  K 4.3  --  4.1  CL 105  --  104  CO2 25  --  28  GLUCOSE 115*  --  111*  BUN 18  --  19  CREATININE 0.86  --  0.91  CALCIUM 8.8  --  8.3*  MG  --  1.9  --   PHOS  --   --  3.4   Liver Function Tests:  Recent Labs Lab 09/12/12 0611  AST 54*  ALT 72*  ALKPHOS 187*  BILITOT 0.7  PROT 6.4  ALBUMIN 2.8*   CBC:  Recent Labs Lab 09/11/12 0506 09/12/12 0611  WBC 15.6* 15.5*  NEUTROABS 12.6*  --   HGB 20.3* 18.3*  HCT 57.5* 53.3*  MCV  94.9 96.4  PLT 316 276   BNP:  Recent Labs Lab 09/11/12 0508  PROBNP 6353.0*   Thyroid Function Tests: No results found for this basename: TSH, T4TOTAL, FREET4, T3FREE, THYROIDAB,  in the last 168 hours Coagulation:  Recent Labs Lab 09/11/12 0506 09/12/12 0841  LABPROT 43.8* 29.4*  INR 5.10* 2.98*   Urine Drug Screen: Drugs of Abuse     Component Value Date/Time   LABOPIA POSITIVE* 09/07/2010 0910   COCAINSCRNUR NONE DETECTED 09/07/2010 0910   LABBENZ NONE DETECTED 09/07/2010 0910   AMPHETMU NONE DETECTED 09/07/2010 0910   THCU NONE DETECTED 09/07/2010 0910   LABBARB  Value: NONE DETECTED        DRUG SCREEN FOR MEDICAL PURPOSES ONLY.  IF CONFIRMATION IS NEEDED FOR ANY PURPOSE, NOTIFY LAB WITHIN 5 DAYS.        LOWEST DETECTABLE LIMITS FOR URINE DRUG SCREEN Drug Class       Cutoff (ng/mL) Amphetamine      1000 Barbiturate      200 Benzodiazepine   200 Tricyclics       300 Opiates  300 Cocaine          300 THC              50 09/07/2010 0910    Urinalysis: No results found for this basename: COLORURINE, APPERANCEUR, LABSPEC, PHURINE, GLUCOSEU, HGBUR, BILIRUBINUR, KETONESUR, PROTEINUR, UROBILINOGEN, NITRITE, LEUKOCYTESUR,  in the last 168 hours  Studies/Results: Dg Chest Portable 1 View  09/11/2012  *RADIOLOGY REPORT*  Clinical Data: Shortness of breath.  PORTABLE CHEST - 1 VIEW  Comparison: 08/30/2012  Findings: Shallow inspiration.  Cardiac enlargement with pulmonary vascular congestion.  Increasing perihilar and right basilar infiltration suggesting developing edema.  No blunting of costophrenic angles.  No pneumothorax.  Mediastinal contours appear intact.  IMPRESSION: Mild cardiac enlargement with developing pulmonary vascular congestion and perihilar edema since previous study.   Original Report Authenticated By: Burman Nieves, M.D.    Medications:  Medications reviewed  Scheduled Meds: . amiodarone  200 mg Oral Daily  . carvedilol  6.25 mg Oral BID WC  . folic acid   1 mg Oral Daily  . furosemide      . multivitamin with minerals  1 tablet Oral Daily  . sodium chloride  3 mL Intravenous Q12H  . thiamine  100 mg Oral Daily   Or  . thiamine  100 mg Intravenous Daily  . Warfarin - Pharmacist Dosing Inpatient   Does not apply q1800   Continuous Infusions:   PRN Meds:.acetaminophen, acetaminophen, LORazepam, LORazepam  Assessment/Plan:  Patient is a 67 year old gentleman with a history of hypertension, atrial fibrillation, alcohol and tobacco use, noncompliance with medications. He presents with worsening shortness of breath over the last month.  Atrial Fibrillation with RVR  Patient's HR elevated to 130s in ED, EKG showed AFib with RVR. Patient with history of the same s/p ablation and cardioversion with recurrent Afib, has not been on anticoagulation or other therapy for 2 years until last admission on 08/30/12 when he was dc'd with warfarin. Shortness of breath likely due to the atrial fibrillation. Recent ECHO during last admission was on 08/31/12 with EF 40-45%, mod LVH, mild diffuse hypokinesis, mild aortic regurg, small pericardial effusion, mod-severe LA dilation. Patient reports compliance with Coreg and Warfarin..  Patient was started on diltiazem drip in the ED and heart rate came down to ~100, still in AFib.   CXR 09/11/12 showed vascular congestion, Kerly B lines, mild pleural effusion 2/23: converted dilt drip to PO cardizem 2/24: will dc diltiazem start Coreg 6.25 given patient's cardiomyopathy. Rate stable.  -start Coreg 6.25 BID with close outpatient follow up to titrate dose of coreg to maintain HR <110 -cont anticoagulation with warfarin -lasix 40mg  IV today, then reevaluate. Replete K as needed  Supratherapeutic INR 5.1 on admission. No evidence of active bleeding, Hgb stable. -INR 2.98 today -cont hold coumadin and restart to maintain therapeutic levels for AFIB  Idiopathic Cardiomyopathy  ECHO results as above. Thought to be  related to etoh vs tachycardia from AF RVR. Pt with SOB, elevated BMP, and edema noted on exam. Treat with lasix as above.   Polycythemia  Patient with chronic polycythemia, noted on previous admission. Hgb 18.7, Hct 54, RDW 16. Unclear etiology, possiblly related to smoking, sleep apnea, or COPD. Peripheral smear from last admission showed reactive leukocytosis. EPO from 08/31/12 was normal at 4.4. -monitor, follow as outpatient  HTN  Stable.  Monitor for now, Coreg PO as above   Alcohol and tobacco abuse  Denies recent binge drinking -cessation counseling  -CIWA protocol, folic acid,  thiamine, multivitamin   DVT PPX  -lovenox   Dispo  -anticipate dc tomorrow -will need close [facility]PC f/u for INR check and coreg dose titration     LOS: 1 day   Denton Ar 09/12/2012, 11:23 AM

## 2012-09-13 ENCOUNTER — Encounter (HOSPITAL_COMMUNITY): Payer: Self-pay | Admitting: Cardiology

## 2012-09-13 ENCOUNTER — Telehealth: Payer: Self-pay | Admitting: Pharmacist

## 2012-09-13 DIAGNOSIS — I10 Essential (primary) hypertension: Secondary | ICD-10-CM

## 2012-09-13 LAB — PROTIME-INR
INR: 1.93 — ABNORMAL HIGH (ref 0.00–1.49)
Prothrombin Time: 21.3 seconds — ABNORMAL HIGH (ref 11.6–15.2)

## 2012-09-13 MED ORDER — WARFARIN SODIUM 5 MG PO TABS: ORAL_TABLET | ORAL | Status: DC

## 2012-09-13 MED ORDER — WARFARIN SODIUM 5 MG PO TABS
5.0000 mg | ORAL_TABLET | Freq: Once | ORAL | Status: DC
  Filled 2012-09-13: qty 1

## 2012-09-13 MED ORDER — CARVEDILOL 3.125 MG PO TABS: 12.5000 mg | ORAL_TABLET | Freq: Two times a day (BID) | ORAL | Status: DC

## 2012-09-13 NOTE — Progress Notes (Signed)
DC IV, DC Tele, DC Home. Discharge instructions and home medications discussed with patient. Patient denied any questions or concerns at this time. Patient leaving unit via wheelchair and appears in no acute distress.  

## 2012-09-13 NOTE — Progress Notes (Signed)
Internal Medicine Attending  Date: 09/13/2012  Patient name: [name] Medical record number: [id number] Date of birth: October 24, 1945 Age: 67 y.o. Gender: male  I saw and evaluated the patient. I reviewed the resident's note by Dr. Collier Bullock and I agree with the resident's findings and plans as documented in her progress note.  [name] feels well this morning. He denies any palpitations, chest pain, orthopnea, paroxysmal nocturnal dyspnea, or shortness of breath. His rate has been controlled in the 80s and 90s and his blood pressure can tolerate further titration of his beta blocker. I agree with the housestaff's plan to increase the Coreg to 12.5 mg by mouth twice daily and discharge him home with quick followup in the [facility] later this week to reassess the need for further titration of his antihypertensive/rate control regimen.

## 2012-09-13 NOTE — Telephone Encounter (Signed)
Asked by [facility] to address warfarin dosing for this patient who's EHR I have reviewed--including previous admission, this admission with all lab's and concomitant medications. Is on amiodarone + warfarin. Had come to hospital for this admission with INR 5.1 which has drifted down from admission to today's value = 1.93  Am recommending warfarin 5mg  today, tomorrow and then 2.5mg  on Thursday with next INR check on Friday 28-FEB-14 at 1000h. Visited with patient and provided all required teachings as well as day/date/time/place and contact phone number for my clinic. Patient verbalizes understanding of this on teach-back.

## 2012-09-13 NOTE — Progress Notes (Signed)
ANTICOAGULATION CONSULT NOTE - Follow Up Consult  Pharmacy Consult for warfarin Indication: atrial fibrillation  Allergies  Allergen Reactions  . Codeine Rash    Patient Measurements: Height: 5\' 6"  (167.6 cm) Weight: 182 lb 4.8 oz (82.691 kg) IBW/kg (Calculated) : 63.8  Vital Signs: Temp: 97.4 F (36.3 C) (02/25 0516) Temp src: Oral (02/25 0516) BP: 143/93 mmHg (02/25 0516) Pulse Rate: 83 (02/25 0516)  Labs:  Recent Labs  09/11/12 0506 09/11/12 0508 09/12/12 0611 09/12/12 0841 09/13/12 0702  HGB 20.3*  --  18.3*  --   --   HCT 57.5*  --  53.3*  --   --   PLT 316  --  276  --   --   LABPROT 43.8*  --   --  29.4* 21.3*  INR 5.10*  --   --  2.98* 1.93*  CREATININE 0.86  --  0.91  --   --   TROPONINI  --  <0.30  --   --   --     Estimated Creatinine Clearance: 81.7 ml/min (by C-G formula based on Cr of 0.91).   Medications:  Scheduled:  . amiodarone  200 mg Oral Daily  . carvedilol  6.25 mg Oral BID WC  . folic acid  1 mg Oral Daily  . [EXPIRED] furosemide      . [COMPLETED] furosemide  40 mg Intravenous Once  . multivitamin with minerals  1 tablet Oral Daily  . sodium chloride  3 mL Intravenous Q12H  . thiamine  100 mg Oral Daily   Or  . thiamine  100 mg Intravenous Daily  . [COMPLETED] warfarin  2.5 mg Oral ONCE-1800  . Warfarin - Pharmacist Dosing Inpatient   Does not apply q1800  . [DISCONTINUED] diltiazem  300 mg Oral Daily    Assessment: 67 y/o of warfarin as outpatient for A-fib who presented to the hospital on 09/11/12 with a SUPRA-therapeutic INR of 5.10. INR today= 1.93 with trend down.  Home coumadin dose: 5mg /day  Goal of Therapy:  INR 2-3 Monitor platelets by anticoagulation protocol: Yes   Plan:  -Will give coumadin 5mg  today -Daily PT/INR  Harland German, Pharm D 09/13/2012 8:08 AM

## 2012-09-13 NOTE — Progress Notes (Addendum)
Subjective: Patient states that he is doing well. His breathing is much better. He has been asymptomatic. He states he is ready to go home today. Denies chest pain, shortness of breath, palpitations.   Objective: Vital signs in last 24 hours: Filed Vitals:   09/12/12 1446 09/12/12 1737 09/12/12 2030 09/13/12 0516  BP: 117/72 127/73 122/72 143/93  Pulse: 81 91 81 83  Temp: 97.6 F (36.4 C)  98.1 F (36.7 C) 97.4 F (36.3 C)  TempSrc: Oral  Oral Oral  Resp: 20  20 20   Height:      Weight:    182 lb 4.8 oz (82.691 kg)  SpO2: 96%  95% 98%   Weight change: -1 lb 5.5 oz (-0.609 kg)  Intake/Output Summary (Last 24 hours) at 09/13/12 0849 Last data filed at 09/13/12 0803  Gross per 24 hour  Intake    843 ml  Output   1825 ml  Net   -982 ml    Physical Exam Blood pressure 102/62, pulse 81, temperature 97.4 F (36.3 C), temperature source Oral, resp. rate 20, height 5\' 6"  (1.676 m), weight 182 lb 4.8 oz (82.691 kg), SpO2 98.00%. General:  No acute distress, alert and oriented x 3, well-appearing  HEENT:  PERRL, EOMI, moist mucous membranes Cardiovascular: Irregularly irregular rhythm, normal rate, no murmurs Respiratory:  Clear to auscultation bilaterally, mild exp wheezes, rare crackles, no rhonchi Abdomen:  Soft, nondistended, nontender, bowel sounds present Extremities:  Warm and well-perfused, trace edema, no calf tenderness Skin: Warm, dry, no rashes Neuro: Not anxious appearing, no depressed mood, normal affect  Lab Results: Basic Metabolic Panel:  Recent Labs Lab 09/11/12 0506 09/11/12 0925 09/12/12 0611  NA 139  --  139  K 4.3  --  4.1  CL 105  --  104  CO2 25  --  28  GLUCOSE 115*  --  111*  BUN 18  --  19  CREATININE 0.86  --  0.91  CALCIUM 8.8  --  8.3*  MG  --  1.9  --   PHOS  --   --  3.4   Liver Function Tests:  Recent Labs Lab 09/12/12 0611  AST 54*  ALT 72*  ALKPHOS 187*  BILITOT 0.7  PROT 6.4  ALBUMIN 2.8*   CBC:  Recent Labs Lab  09/11/12 0506 09/12/12 0611  WBC 15.6* 15.5*  NEUTROABS 12.6*  --   HGB 20.3* 18.3*  HCT 57.5* 53.3*  MCV 94.9 96.4  PLT 316 276   BNP:  Recent Labs Lab 09/11/12 0508  PROBNP 6353.0*   Coagulation:  Recent Labs Lab 09/11/12 0506 09/12/12 0841 09/13/12 0702  LABPROT 43.8* 29.4* 21.3*  INR 5.10* 2.98* 1.93*   Urine Drug Screen: Drugs of Abuse     Component Value Date/Time   LABOPIA POSITIVE* 09/07/2010 0910   COCAINSCRNUR NONE DETECTED 09/07/2010 0910   LABBENZ NONE DETECTED 09/07/2010 0910   AMPHETMU NONE DETECTED 09/07/2010 0910   THCU NONE DETECTED 09/07/2010 0910   LABBARB  Value: NONE DETECTED        DRUG SCREEN FOR MEDICAL PURPOSES ONLY.  IF CONFIRMATION IS NEEDED FOR ANY PURPOSE, NOTIFY LAB WITHIN 5 DAYS.        LOWEST DETECTABLE LIMITS FOR URINE DRUG SCREEN Drug Class       Cutoff (ng/mL) Amphetamine      1000 Barbiturate      200 Benzodiazepine   200 Tricyclics       300 Opiates  300 Cocaine          300 THC              50 09/07/2010 0910     Medications:  Medications reviewed  Scheduled Meds: . amiodarone  200 mg Oral Daily  . carvedilol  6.25 mg Oral BID WC  . folic acid  1 mg Oral Daily  . multivitamin with minerals  1 tablet Oral Daily  . sodium chloride  3 mL Intravenous Q12H  . thiamine  100 mg Oral Daily   Or  . thiamine  100 mg Intravenous Daily  . warfarin  5 mg Oral ONCE-1800  . Warfarin - Pharmacist Dosing Inpatient   Does not apply q1800   Continuous Infusions:   PRN Meds:.acetaminophen, acetaminophen, LORazepam, LORazepam  Assessment/Plan:  Patient is a 67 year old gentleman with a history of hypertension, atrial fibrillation, alcohol and tobacco use, noncompliance with medications. He presents with worsening shortness of breath over the last few days.  Atrial Fibrillation with RVR  Rate in 80s-90s overnight, SOB resolved. -increase Coreg to 12.5 BID with close outpatient follow up to titrate dose of coreg to maintain HR  <110 -cont amiodarone -cont anticoagulation with warfarin, will DC with warfarin as below and INR check  Supratherapeutic INR: resolved INR 1.9 today -dc with warfarin as follows per Dr. Alexandria Lodge: 5mg  today, 5mg  on 2/26, 2.5mg  2/27 and close INR check on Friday 10am with Dr. Alexandria Lodge in [facility]  Acute on chronic systolic heart failure Patient has idiopathic cardiomyopathy likely from uncontrolled AF with RVR, ECHO results as above. He appeared mildly volume overloaded on presentation and responded well to the 40mg  IV lasix given yesterday. -follow up in clinic -cont Coreg, lisinopril  Polycythemia  Stable -follow as outpatient  HTN  Stable.  Monitor for now, Coreg, lisinopril as above   Alcohol and tobacco abuse  -cessation counseling   Dispo  -dc today  -close [facility] follow up on Thursday, INR check with Dr. Alexandria Lodge on Friday     LOS: 2 days   [place] Ar 09/13/2012, 8:49 AM

## 2012-09-15 ENCOUNTER — Ambulatory Visit: Payer: Medicare Other | Admitting: Internal Medicine

## 2012-09-15 ENCOUNTER — Ambulatory Visit (INDEPENDENT_AMBULATORY_CARE_PROVIDER_SITE_OTHER): Payer: Medicare Other | Admitting: Internal Medicine

## 2012-09-15 ENCOUNTER — Ambulatory Visit (HOSPITAL_COMMUNITY)
Admission: RE | Admit: 2012-09-15 | Discharge: 2012-09-15 | Disposition: A | Discharge status: Home / Self Care | Payer: Medicare Other | Source: Ambulatory Visit | Attending: Internal Medicine | Admitting: Internal Medicine

## 2012-09-15 ENCOUNTER — Encounter: Payer: Self-pay | Admitting: Internal Medicine

## 2012-09-15 VITALS — BP 135/90 | HR 79 | Temp 97.0°F | Ht 66.0 in | Wt 188.4 lb

## 2012-09-15 DIAGNOSIS — Z5181 Encounter for therapeutic drug level monitoring: Secondary | ICD-10-CM

## 2012-09-15 DIAGNOSIS — Z7901 Long term (current) use of anticoagulants: Secondary | ICD-10-CM

## 2012-09-15 DIAGNOSIS — I428 Other cardiomyopathies: Secondary | ICD-10-CM

## 2012-09-15 DIAGNOSIS — I4891 Unspecified atrial fibrillation: Secondary | ICD-10-CM

## 2012-09-15 DIAGNOSIS — R7402 Elevation of levels of lactic acid dehydrogenase (LDH): Secondary | ICD-10-CM

## 2012-09-15 DIAGNOSIS — F1010 Alcohol abuse, uncomplicated: Secondary | ICD-10-CM

## 2012-09-15 DIAGNOSIS — Z720 Tobacco use: Secondary | ICD-10-CM

## 2012-09-15 DIAGNOSIS — R748 Abnormal levels of other serum enzymes: Secondary | ICD-10-CM

## 2012-09-15 DIAGNOSIS — F17200 Nicotine dependence, unspecified, uncomplicated: Secondary | ICD-10-CM

## 2012-09-15 DIAGNOSIS — I10 Essential (primary) hypertension: Secondary | ICD-10-CM

## 2012-09-15 LAB — HEPATIC FUNCTION PANEL
AST: 57 U/L — ABNORMAL HIGH (ref 0–37)
Albumin: 3.3 g/dL — ABNORMAL LOW (ref 3.5–5.2)
Alkaline Phosphatase: 228 U/L — ABNORMAL HIGH (ref 39–117)
Bilirubin, Direct: 0.3 mg/dL (ref 0.0–0.3)
Indirect Bilirubin: 0.8 mg/dL (ref 0.0–0.9)
Total Bilirubin: 1.1 mg/dL (ref 0.3–1.2)

## 2012-09-15 LAB — POCT INR: INR: 2.3

## 2012-09-15 LAB — BASIC METABOLIC PANEL
Calcium: 8.6 mg/dL (ref 8.4–10.5)
Glucose, Bld: 81 mg/dL (ref 70–99)
Potassium: 4.7 mEq/L (ref 3.5–5.3)
Sodium: 141 mEq/L (ref 135–145)

## 2012-09-15 NOTE — Assessment & Plan Note (Signed)
CMET, ggt  In future may need hepatitis panel, liver US in transaminases not trended down

## 2012-09-15 NOTE — Assessment & Plan Note (Signed)
Encouraged smoking cessation. He is smoking less Declines patches

## 2012-09-15 NOTE — Progress Notes (Signed)
  Subjective:    Patient ID: [name], male    DOB: 08/15/1945, 67 y.o.   MRN: [id number]  HPI Comments: 67 y.o [place] male PMH cardiomyopathy EF 40-45%, ,h/o alcohol abuse, h/o tobacco abuse, HTN atrial fibrillation with RVR s/p ablation with recurrent AF 08/2012 with noncompliance of medications.  This is hospital f/u after 2 recent discharges 08/2012.       Review of Systems  Respiratory: Positive for shortness of breath.        Sob with exertion, much improved but still present  Cardiovascular: Negative for chest pain, palpitations and leg swelling.  Neurological: Negative for dizziness and light-headedness.  All other systems reviewed and are negative.       Objective:   Physical Exam  Nursing note and vitals reviewed. Constitutional: He is oriented to person, place, and time. He appears well-developed and well-nourished. He is cooperative. No distress.  HENT:  Head: Normocephalic and atraumatic.  Mouth/Throat: Oropharynx is clear and moist and mucous membranes are normal. No oropharyngeal exudate.  Eyes: Conjunctivae are normal. Pupils are equal, round, and reactive to light. Right eye exhibits no discharge. Left eye exhibits no discharge. No scleral icterus.  Cardiovascular: An irregularly irregular rhythm present. Tachycardia present.   No murmur heard. Slightly tachy <105   Pulmonary/Chest: Effort normal and breath sounds normal.  Abdominal: Soft. Bowel sounds are normal. He exhibits no distension. There is no tenderness.  Obese ab  Musculoskeletal: He exhibits no edema.  Neurological: He is alert and oriented to person, place, and time. Gait normal.  Skin: Skin is warm, dry and intact. No rash noted.  Psychiatric: He has a normal mood and affect. His speech is normal and behavior is normal. Judgment and thought content normal. Cognition and memory are normal.  Pleasant           Assessment & Plan:  F/u 1 month

## 2012-09-15 NOTE — Assessment & Plan Note (Addendum)
BP initially elevated 142/104 then trended down to 135/90  Continue Coreg, Lisinopril 5 mg  BP log given

## 2012-09-15 NOTE — Assessment & Plan Note (Signed)
On Coreg, Lisinopril

## 2012-09-15 NOTE — Assessment & Plan Note (Addendum)
INR 2.3 today. F/u with Dr. Alexandria Lodge 2/28 10 am for Coumadin dosing  Continue Amiodarone 200 mg qd Patient was taking 6.25 mg bid though instructed on bottle 12.5 mg bid.  Advised correct way to take medication and patient agreeable simple misunderstanding Rate 98 rest, 100 with exertion; EKG in AF today rate 105

## 2012-09-15 NOTE — Patient Instructions (Addendum)
General Instructions: Please read over the instructions Bring medications to each visit If your run out of your medication call for more Follow up with Dr. Alexandria Lodge tomorrow Friday for further instructions on how to take your Coumadin Stop smoking and drinking alcohol  Exercise more Eat less and healthy choices  Come back in 1 months    Treatment Goals:  Goals (1 Years of Data) as of 09/15/12         As of Today As of Today 09/13/12 09/13/12 09/12/12     Blood Pressure    . Blood Pressure < 140/90  135/90 142/104 102/62 143/93 122/72      Progress Toward Treatment Goals:  Treatment Goal 09/15/2012  Blood pressure unable to assess  Stop smoking smoking less    Self Care Goals & Plans:  Self Care Goal 09/15/2012  Manage my medications take my medicines as prescribed; bring my medications to every visit; refill my medications on time  Monitor my health keep track of my blood pressure  Eat healthy foods drink diet soda or water instead of juice or soda; eat more vegetables; eat foods that are low in salt; eat baked foods instead of fried foods; eat fruit for snacks and desserts; eat smaller portions  Be physically active park at the far end of the parking lot  Stop smoking go to the [employer] ([employer]); call [facility] (1-800-QUIT-NOW); set a quit date and stop smoking       Care Management & Community Referrals:  Referral 09/15/2012  Referrals made for care management support none needed  Referrals made to community resources (No Data)     Smoking Cessation Quitting smoking is important to your health and has many advantages. However, it is not always easy to quit since nicotine is a very addictive drug. Often times, people try 3 times or more before being able to quit. This document explains the best ways for you to prepare to quit smoking. Quitting takes hard work and a lot of effort, but you can do it. ADVANTAGES OF QUITTING SMOKING  You will live  longer, feel better, and live better.  Your body will feel the impact of quitting smoking almost immediately.  Within 20 minutes, blood pressure decreases. Your pulse returns to its normal level.  After 8 hours, carbon monoxide levels in the blood return to normal. Your oxygen level increases.  After 24 hours, the chance of having a heart attack starts to decrease. Your breath, hair, and body stop smelling like smoke.  After 48 hours, damaged nerve endings begin to recover. Your sense of taste and smell improve.  After 72 hours, the body is virtually free of nicotine. Your bronchial tubes relax and breathing becomes easier.  After 2 to 12 weeks, lungs can hold more air. Exercise becomes easier and circulation improves.  The risk of having a heart attack, stroke, cancer, or lung disease is greatly reduced.  After 1 year, the risk of coronary heart disease is cut in half.  After 5 years, the risk of stroke falls to the same as a nonsmoker.  After 10 years, the risk of lung cancer is cut in half and the risk of other cancers decreases significantly.  After 15 years, the risk of coronary heart disease drops, usually to the level of a nonsmoker.  If you are pregnant, quitting smoking will improve your chances of having a healthy baby.  The people you live with, especially any children, will be healthier.  You will have  extra money to spend on things other than cigarettes. QUESTIONS TO THINK ABOUT BEFORE ATTEMPTING TO QUIT You may want to talk about your answers with your caregiver.  Why do you want to quit?  If you tried to quit in the past, what helped and what did not?  What will be the most difficult situations for you after you quit? How will you plan to handle them?  Who can help you through the tough times? Your family? Friends? A caregiver?  What pleasures do you get from smoking? What ways can you still get pleasure if you quit? Here are some questions to ask your  caregiver:  How can you help me to be successful at quitting?  What medicine do you think would be best for me and how should I take it?  What should I do if I need more help?  What is smoking withdrawal like? How can I get information on withdrawal? GET READY  Set a quit date.  Change your environment by getting rid of all cigarettes, ashtrays, matches, and lighters in your home, car, or work. Do not let people smoke in your home.  Review your past attempts to quit. Think about what worked and what did not. GET SUPPORT AND ENCOURAGEMENT You have a better chance of being successful if you have help. You can get support in many ways.  Tell your family, friends, and co-workers that you are going to quit and need their support. Ask them not to smoke around you.  Get individual, group, or telephone counseling and support. Programs are available at [employer] and health centers. Call your local health department for information about programs in your area.  Spiritual beliefs and practices may help some smokers quit.  Download a "quit meter" on your computer to keep track of quit statistics, such as how long you have gone without smoking, cigarettes not smoked, and money saved.  Get a self-help book about quitting smoking and staying off of tobacco. LEARN NEW SKILLS AND BEHAVIORS  Distract yourself from urges to smoke. Talk to someone, go for a walk, or occupy your time with a task.  Change your normal routine. Take a different route to work. Drink tea instead of coffee. Eat breakfast in a different place.  Reduce your stress. Take a hot bath, exercise, or read a book.  Plan something enjoyable to do every day. Reward yourself for not smoking.  Explore interactive web-based programs that specialize in helping you quit. GET MEDICINE AND USE IT CORRECTLY Medicines can help you stop smoking and decrease the urge to smoke. Combining medicine with the above behavioral methods and  support can greatly increase your chances of successfully quitting smoking.  Nicotine replacement therapy helps deliver nicotine to your body without the negative effects and risks of smoking. Nicotine replacement therapy includes nicotine gum, lozenges, inhalers, nasal sprays, and skin patches. Some may be available over-the-counter and others require a prescription.  Antidepressant medicine helps people abstain from smoking, but how this works is unknown. This medicine is available by prescription.  Nicotinic receptor partial agonist medicine simulates the effect of nicotine in your brain. This medicine is available by prescription. Ask your caregiver for advice about which medicines to use and how to use them based on your health history. Your caregiver will tell you what side effects to look out for if you choose to be on a medicine or therapy. Carefully read the information on the package. Do not use any other product containing  nicotine while using a nicotine replacement product.  RELAPSE OR DIFFICULT SITUATIONS Most relapses occur within the first 3 months after quitting. Do not be discouraged if you start smoking again. Remember, most people try several times before finally quitting. You may have symptoms of withdrawal because your body is used to nicotine. You may crave cigarettes, be irritable, feel very hungry, cough often, get headaches, or have difficulty concentrating. The withdrawal symptoms are only temporary. They are strongest when you first quit, but they will go away within 10 14 days. To reduce the chances of relapse, try to:  Avoid drinking alcohol. Drinking lowers your chances of successfully quitting.  Reduce the amount of caffeine you consume. Once you quit smoking, the amount of caffeine in your body increases and can give you symptoms, such as a rapid heartbeat, sweating, and anxiety.  Avoid smokers because they can make you want to smoke.  Do not let weight gain distract  you. Many smokers will gain weight when they quit, usually less than 10 pounds. Eat a healthy diet and stay active. You can always lose the weight gained after you quit.  Find ways to improve your mood other than smoking. FOR MORE INFORMATION  www.smokefree.gov  Document Released: 06/30/2001 Document Revised: 01/05/2012 Document Reviewed: 10/15/2011 [facility] Patient Information 2013 [place], [place].  Heart Failure Heart failure means your heart has trouble pumping blood. The blood is not circulated very well in your body because your heart is weak. Heart failure may cause blood to back up into your lungs. This is commonly called "fluid in the lungs." Heart failure may also cause your ankles and legs to puff up (swell). It is important to take good care of yourself when you have heart failure. HOME CARE Medicine  Take your medicine as told by your doctor.  Do not stop taking your medicine unless told to by your doctor.  Be sure to get your medicine refilled before it runs out.  Do not skip any doses of medicine.  Tell your doctor if you cannot afford your medicine.  Keep a list of all the medicine you take. This should include the name, how much you take, and when you take it.  Ask your doctor if you have any questions about your medicine. Do not take over-the-counter medicine unless your doctor says it is okay. What you eat  Do not drink alcohol unless your doctor says it is okay.  Avoid food that is high in fat. Avoid foods fried in oil or made with fat.  Eat a healthy diet. A dietitian can help you with healthy food choices.  Limit how much salt you eat. Do not eat more than 1500 mg of salt (sodium) a day.  Do not add salt to your food.  Do not eat food made with a lot of salt. Here are some examples:  Canned vegetables.  Canned soups.  Canned drinks.  Hot dogs.  Fast food.  Pizza.  Chips. Check your weight  Weigh yourself every morning. You should do this  after you pee (urinate) and before you eat breakfast.  Wear the same amount of clothes each time you weigh yourself.  Write down your weight every day. Tell your doctor if you gain 3 lb/1.4 kg or more in 1 day or 5 lb/2.3 kg in a week. Blood pressure monitoring  Buy a home blood pressure cuff.  Check your blood pressure as told by your doctor. Write down your blood pressure numbers on a sheet of  paper.  Bring your blood pressure numbers to your doctor visits. Smoking  Smoking is bad for your heart.  Ask your doctor how to stop smoking. Exercise  Talk to your doctor about exercise.  Ask how much exercise is right for you.  Exercise as much as you can. Stop if you feel tired, have problems breathing, or have chest pain. Keep all your doctor appointments. GET HELP IF:   You gain 3 lb/1.4 kg or more in 1 day or 5 lb/2.3 kg in a week.  You have trouble breathing.  You have a cough that does not go away.  You have puffy ankles or legs.  You have an enlarged (bloated) belly (abdomen).  You cannot sleep because you have trouble breathing. GET HELP RIGHT AWAY IF:   You pass out (faint).  You have really bad chest pain or pressure. This includes pain or pressure in your:  Arms.  Jaw.  Neck.  Back. MAKE SURE YOU:   Understand these instructions.  Will watch your condition.  Will get help right away if you are not doing well or get worse. Document Released: 04/14/2008 Document Revised: 01/05/2012 Document Reviewed: 11/06/2008 [facility] Patient Information 2013 [place], [place].  Atrial Fibrillation Atrial fibrillation is an abnormal heartbeat (rhythm). It can cause your heart rate to be faster or slower than normal, and can cause clots of blood to form in your heart. These clots can cause other health problems. Atrial fibrillation may be caused by a heart attack, lung problem, or certain medicine. Sometimes the cause of atrial fibrillation is not found. HOME  CARE  Take blood thinning medicine (anticoagulants) as told by your doctor. Your doctor will need to draw your blood to check lab values if you take blood thinners.  If you had a cardioversion, limit your activity as told by your doctor.  Learn how to check your heartbeat (pulse) for an abnormal or irregular beat. Your doctor can show you how.  Ask your doctor if it is okay to exercise.  Only take medicine as told by your doctor. GET HELP RIGHT AWAY IF:   You have trouble breathing or feel dizzy.  You have puffy (swollen) feet or ankles.  You have blood in your pee (urine) or poop (bowel movement).  You feel your heart "skipping" beats.  You feel your heart "racing" or beating fast.  You have weakness in your arms or legs.  You have trouble talking, seeing, or thinking.  You have chest pain or pain in your arm or jaw. MAKE SURE YOU:   Understand these instructions.  Will watch your condition.  Will get help right away if you are not doing well or get worse. Document Released: 04/14/2008 Document Revised: 09/28/2011 Document Reviewed: 10/24/2009 [facility] Patient Information 2013 [place], [place].  Hypertension Hypertension is another name for high blood pressure. High blood pressure may mean that your heart needs to work harder to pump blood. Blood pressure consists of two numbers, which includes a higher number over a lower number (example: 110/72). HOME CARE   Make lifestyle changes as told by your doctor. This may include weight loss and exercise.  Take your blood pressure medicine every day.  Limit how much salt you use.  Stop smoking if you smoke.  Do not use drugs.  Talk to your doctor if you are using decongestants or birth control pills. These medicines might make blood pressure higher.  Females should not drink more than 1 alcoholic drink per day. Males should not  drink more than 2 alcoholic drinks per day.  See your doctor as told. GET HELP RIGHT AWAY  IF:   You have a blood pressure reading with a top number of 180 or higher.  You get a very bad headache.  You get blurred or changing vision.  You feel confused.  You feel weak, numb, or faint.  You get chest or belly (abdominal) pain.  You throw up (vomit).  You cannot breathe very well. MAKE SURE YOU:   Understand these instructions.  Will watch your condition.  Will get help right away if you are not doing well or get worse. Document Released: 12/23/2007 Document Revised: 09/28/2011 Document Reviewed: 12/23/2007 [facility] Patient Information 2013 [place], [place].

## 2012-09-15 NOTE — Assessment & Plan Note (Signed)
Patient denies alcohol use Encouraged cessation if still using

## 2012-09-16 ENCOUNTER — Ambulatory Visit (INDEPENDENT_AMBULATORY_CARE_PROVIDER_SITE_OTHER): Payer: Medicare Other | Admitting: Pharmacist

## 2012-09-16 DIAGNOSIS — I4891 Unspecified atrial fibrillation: Secondary | ICD-10-CM

## 2012-09-16 DIAGNOSIS — Z7901 Long term (current) use of anticoagulants: Secondary | ICD-10-CM

## 2012-09-16 NOTE — Progress Notes (Signed)
Anti-Coagulation Progress Note  [name] is a 67 y.o. male who is currently on an anti-coagulation regimen.    RECENT RESULTS: Recent results are below, the most recent result is correlated with a dose of 5mg  x 2 days; 2.5mg  for one day, then INR today. Lab Results  Component Value Date   INR 2.5 09/16/2012   INR 2.3 09/15/2012   INR 1.93* 09/13/2012    ANTI-COAG DOSE: Anticoagulation Dose Instructions as of 09/16/2012     Glynis Smiles Tue Wed Thu Fri Sat   New Dose 5 mg 5 mg 2.5 mg 5 mg 2.5 mg 5 mg 2.5 mg       ANTICOAG SUMMARY: Anticoagulation Episode Summary   Current INR goal 2.0-3.0  Next INR check 09/26/2012  INR from last check 2.5 (09/16/2012)  Weekly max dose   Target end date Indefinite  [facility]  Preferred lab   Send INR reminders to    Indications  Atrial fibrillation [427.31] Long term (current) use of anticoagulants [V58.61]        Comments         ANTICOAG TODAY: Anticoagulation Summary as of 09/16/2012   INR goal 2.0-3.0  Selected INR 2.5 (09/16/2012)  Next INR check 09/26/2012  Target end date Indefinite   Indications  Atrial fibrillation [427.31] Long term (current) use of anticoagulants [V58.61]      Anticoagulation Episode Summary   [facility]   Preferred lab    Send INR reminders to    Comments       PATIENT INSTRUCTIONS: Patient Instructions  Patient instructed to take medications as defined in the Anti-coagulation Track section of this encounter.  Patient instructed to take today's dose.  Patient verbalized understanding of these instructions.       FOLLOW-UP Return in 10 days (on 09/26/2012) for Follow up INR at 1000h.  Hulen Luster, III Pharm.D., CACP

## 2012-09-16 NOTE — Patient Instructions (Signed)
Patient instructed to take medications as defined in the Anti-coagulation Track section of this encounter.  Patient instructed to take today's dose.  Patient verbalized understanding of these instructions.    

## 2012-09-17 ENCOUNTER — Encounter: Payer: Self-pay | Admitting: Internal Medicine

## 2012-09-26 ENCOUNTER — Ambulatory Visit (INDEPENDENT_AMBULATORY_CARE_PROVIDER_SITE_OTHER): Payer: Medicare Other | Admitting: Pharmacist

## 2012-09-26 DIAGNOSIS — I4891 Unspecified atrial fibrillation: Secondary | ICD-10-CM

## 2012-09-26 DIAGNOSIS — Z7901 Long term (current) use of anticoagulants: Secondary | ICD-10-CM

## 2012-09-26 LAB — POCT INR: INR: 3.6

## 2012-09-26 NOTE — Progress Notes (Signed)
Anti-Coagulation Progress Note  [name] is a 67 y.o. male who is currently on an anti-coagulation regimen.    RECENT RESULTS: Recent results are below, the most recent result is correlated with a dose of 27.5 mg. per week: Lab Results  Component Value Date   INR 3.60 09/26/2012   INR 2.5 09/16/2012   INR 2.3 09/15/2012    ANTI-COAG DOSE: Anticoagulation Dose Instructions as of 09/26/2012     Glynis Smiles Tue Wed Thu Fri Sat   New Dose 2.5 mg 5 mg 2.5 mg 2.5 mg 5 mg 2.5 mg 2.5 mg       ANTICOAG SUMMARY: Anticoagulation Episode Summary   Current INR goal 2.0-3.0  Next INR check 10/10/2012  INR from last check 3.60! (09/26/2012)  Weekly max dose   Target end date Indefinite  [facility]  Preferred lab   Send INR reminders to    Indications  Atrial fibrillation [427.31] Long term (current) use of anticoagulants [V58.61]        Comments         ANTICOAG TODAY: Anticoagulation Summary as of 09/26/2012   INR goal 2.0-3.0  Selected INR 3.60! (09/26/2012)  Next INR check 10/10/2012  Target end date Indefinite   Indications  Atrial fibrillation [427.31] Long term (current) use of anticoagulants [V58.61]      Anticoagulation Episode Summary   [facility]   Preferred lab    Send INR reminders to    Comments       PATIENT INSTRUCTIONS: Patient Instructions  Patient instructed to take medications as defined in the Anti-coagulation Track section of this encounter.  Patient instructed to take today's dose.  Patient verbalized understanding of these instructions.       FOLLOW-UP Return in 2 weeks (on 10/10/2012) for Follow up INR at 0930h.  Hulen Luster, III Pharm.D., CACP

## 2012-09-26 NOTE — Patient Instructions (Signed)
Patient instructed to take medications as defined in the Anti-coagulation Track section of this encounter.  Patient instructed to take today's dose.  Patient verbalized understanding of these instructions.    

## 2012-10-05 ENCOUNTER — Encounter: Payer: Self-pay | Admitting: Cardiology

## 2012-10-05 NOTE — Progress Notes (Unsigned)
Patient ID: [name], male   DOB: 19-Mar-1946, 67 y.o.   MRN: [id number]   Vikas, Wegmann    Date of visit:  10/05/2012 DOB:  07-07-1946    Age:  67 yrs. Medical record number:  [id number]     ____________________________ CURRENT DIAGNOSES  1. Cardiomyopathy Idiopathic  2. Personal History Of Noncompliance With Medical Treatment Presenting Hazards To Health  3. Long-term (current) use of anticoagulants  4. Obesity(BMI30-40)  5. Atrial Fibrillation ____________________________ ALLERGIES  codeine sulfate ____________________________ MEDICATIONS  1. lisinopril 5 mg Tablet, 1 p.o. daily  2. carvedilol 3.125 mg tablet, 4 tabs bid  3. warfarin 5 mg tablet, Take as directed  4. amiodarone 200 mg tablet, 1 p.o. daily  5. furosemide 40 mg tablet, 1 p.o. daily ____________________________ CHIEF COMPLAINTS  Ankle edema  Dyspnea  Followup of Arrhythmia-Atrial Flutter ____________________________ HISTORY OF PRESENT ILLNESS  Patient seen for cardiac followup. He was last seen by me a couple of years ago. He was admitted with dyspnea and found to have paroxysmal atrial fibrillation. I had recommended treatment with Xarelto but he has been on warfarin and this has been managed. He continues to have some dyspnea and also has edema. He brings in his prescriptions and appears to be compliant with them. He is not currently using alcohol and is smoking some still. He doesn't have PND orthopnea and doesn't have angina. His liver enzymes were up on a recent visit. ____________________________ PAST HISTORY  Past Medical Illnesses:  obesity, polycythemia-secondary;  Cardiovascular Illnesses:  atrial flutter, cardiomyopathy(dilated);  Surgical Procedures:  hemmoroidectomy;  Cardiology Procedures-Invasive:  RF ablation for atrial flutter February 2012;  Cardiology Procedures-Noninvasive:  echocardiogram February 2014;  LVEF of 40% documented via echocardiogram on  08/31/2012 ____________________________ CARDIO-PULMONARY TEST DATES EKG Date:  10/05/2012;  Echocardiography Date: 08/31/2012;  Chest Xray Date: 08/30/2012;   ____________________________ REVIEW OF SYSTEMS General:  obesity  Eyes:  denies diplopia, history of glaucoma or visual problems.  Respiratory:  dyspnea with exertion  Cardiovascular:  please review HPI  Abdominal:  denies dyspepsia, GI bleeding, constipation, or diarrhea  Musculoskeletal:  denies arthritis, venous insufficiency, or muscle weakness.  Neurological:  denies headaches, stroke, or TIA ____________________________ PHYSICAL EXAMINATION VITAL SIGNS  Blood Pressure:  94/60 Sitting, Right arm, regular cuff  , 102/68 Standing, Right arm and regular cuff   Pulse:  60/min. Weight:  201.00 lbs. Height:  66"BMI: 32  Constitutional:  pleasant Hispanic male in no acute distress, mildly obese Skin:  warm and dry to touch, no apparent skin lesions, or masses noted. Head:  normocephalic, normal hair pattern, no masses or tenderness ENT:  ears, nose and throat reveal no gross abnormalities.  Dentition good. Neck:  supple, without massess. No JVD, thyromegaly or carotid bruits. Carotid upstroke normal. Chest:  normal symmetry, clear to auscultation and percussion. Cardiac:  regular rhythm, normal S1 and S2, No S3 or S4, no murmurs, gallops or rubs detected. Peripheral Pulses:  the femoral,dorsalis pedis, and posterior tibial pulses are full and equal bilaterally with no bruits auscultated. Extremities & Back:  2+ edema Neurological:  no gross motor or sensory deficits noted, affect appropriate, oriented x3. ____________________________ MOST RECENT LIPID PANEL 09/08/10  CHOL TOTL 143 mg/dl, LDL 98 NM, HDL 34 mg/dl, TRIGLYCER 57 mg/dl and CHOL/HDL 4.2 (Calc) ____________________________ IMPRESSIONS/PLAN  1. 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 Paroxysmal atrial fibrillation currently in sinus rhythm on the amiodarone 3.  Long-term anticoagulation with warfarin 4. History of atrial flutter ablation 5. Obesity 6. Elevation  of liver enzymes of unclear cause. He is on amiodarone and if it persists this might need to be discontinued we'll cut back.  Recommendations:  He was edematous today and still has some dyspnea. I recommended that he go on furosemide 40 mg daily. I will see him back in a couple of months. He should remain off of alcohol and we talked about the importance of stopping smoking. Followup liver enzymes and if it remains elevated may need to cut back were discontinued amiodarone. EKG shows sinus bradycardia, NSSTW changes and PAC's. the ____________________________ TODAYS ORDERS  1. 12 Lead EKG: Today  2. Return Visit: 1 month                       ____________________________ Cardiology Physician:  Darden Palmer MD [facility]

## 2012-10-10 ENCOUNTER — Ambulatory Visit (INDEPENDENT_AMBULATORY_CARE_PROVIDER_SITE_OTHER): Payer: Medicare Other | Admitting: Pharmacist

## 2012-10-10 DIAGNOSIS — Z7901 Long term (current) use of anticoagulants: Secondary | ICD-10-CM

## 2012-10-10 DIAGNOSIS — I4891 Unspecified atrial fibrillation: Secondary | ICD-10-CM

## 2012-10-10 LAB — POCT INR: INR: 2.3

## 2012-10-10 NOTE — Patient Instructions (Signed)
Patient instructed to take medications as defined in the Anti-coagulation Track section of this encounter.  Patient instructed to take today's dose.  Patient verbalized understanding of these instructions.    

## 2012-10-10 NOTE — Progress Notes (Signed)
Anti-Coagulation Progress Note  Xxavier Noon is a 67 y.o. male who is currently on an anti-coagulation regimen.    RECENT RESULTS: Recent results are below, the most recent result is correlated with a dose of 22.5 mg. per week: Lab Results  Component Value Date   INR 2.30 10/10/2012   INR 3.60 09/26/2012   INR 2.5 09/16/2012    ANTI-COAG DOSE: Anticoagulation Dose Instructions as of 10/10/2012     Glynis Smiles Tue Wed Thu Fri Sat   New Dose 2.5 mg 5 mg 2.5 mg 2.5 mg 5 mg 2.5 mg 2.5 mg       ANTICOAG SUMMARY: Anticoagulation Episode Summary   Current INR goal 2.0-3.0  Next INR check 10/31/2012  INR from last check 2.30 (10/10/2012)  Weekly max dose   Target end date Indefinite  [facility]  Preferred lab   Send INR reminders to    Indications  Atrial fibrillation [427.31] Long term (current) use of anticoagulants [V58.61]        Comments         ANTICOAG TODAY: Anticoagulation Summary as of 10/10/2012   INR goal 2.0-3.0  Selected INR 2.30 (10/10/2012)  Next INR check 10/31/2012  Target end date Indefinite   Indications  Atrial fibrillation [427.31] Long term (current) use of anticoagulants [V58.61]      Anticoagulation Episode Summary   [facility]   Preferred lab    Send INR reminders to    Comments       PATIENT INSTRUCTIONS: Patient Instructions  Patient instructed to take medications as defined in the Anti-coagulation Track section of this encounter.  Patient instructed to take today's dose.  Patient verbalized understanding of these instructions.       FOLLOW-UP Return in 3 weeks (on 10/31/2012) for Follow up INR at 1015h.  Hulen Luster, III Pharm.D., CACP

## 2012-10-12 ENCOUNTER — Other Ambulatory Visit: Payer: Self-pay | Admitting: *Deleted

## 2012-10-12 MED ORDER — CARVEDILOL 12.5 MG PO TABS: 12.5000 mg | ORAL_TABLET | Freq: Two times a day (BID) | ORAL | Status: DC

## 2012-10-12 NOTE — Telephone Encounter (Signed)
Probably to give pt 12.5 tabs, 1 twice daily instead of pt taking 8 pills total daily would be easier for pt unless there is a reason for using the 3.125

## 2012-10-31 ENCOUNTER — Ambulatory Visit (INDEPENDENT_AMBULATORY_CARE_PROVIDER_SITE_OTHER): Payer: Medicare Other | Admitting: Pharmacist

## 2012-10-31 DIAGNOSIS — Z7901 Long term (current) use of anticoagulants: Secondary | ICD-10-CM

## 2012-10-31 DIAGNOSIS — I4891 Unspecified atrial fibrillation: Secondary | ICD-10-CM

## 2012-10-31 LAB — POCT INR: INR: 2

## 2012-10-31 NOTE — Progress Notes (Signed)
Indication: Atrial fibrillation.  Duration: Lifelong.  INR at target.  Agree with Dr. Groce's assessment and plan as documented. 

## 2012-10-31 NOTE — Progress Notes (Signed)
Anti-Coagulation Progress Note  [name] is a 67 y.o. male who is currently on an anti-coagulation regimen.    RECENT RESULTS: Recent results are below, the most recent result is correlated with a dose of 22.5 mg. per week: Lab Results  Component Value Date   INR 2.00 10/31/2012   INR 2.30 10/10/2012   INR 3.60 09/26/2012    ANTI-COAG DOSE: Anticoagulation Dose Instructions as of 10/31/2012     Glynis Smiles Tue Wed Thu Fri Sat   New Dose 5 mg 2.5 mg 5 mg 2.5 mg 5 mg 2.5 mg 5 mg       ANTICOAG SUMMARY: Anticoagulation Episode Summary   Current INR goal 2.0-3.0  Next INR check 11/28/2012  INR from last check 2.00 (10/31/2012)  Weekly max dose   Target end date Indefinite  [facility]  Preferred lab   Send INR reminders to    Indications  Atrial fibrillation [427.31] Long term (current) use of anticoagulants [V58.61]        Comments         ANTICOAG TODAY: Anticoagulation Summary as of 10/31/2012   INR goal 2.0-3.0  Selected INR 2.00 (10/31/2012)  Next INR check 11/28/2012  Target end date Indefinite   Indications  Atrial fibrillation [427.31] Long term (current) use of anticoagulants [V58.61]      Anticoagulation Episode Summary   [facility]   Preferred lab    Send INR reminders to    Comments       PATIENT INSTRUCTIONS: Patient Instructions  Patient instructed to take medications as defined in the Anti-coagulation Track section of this encounter.  Patient instructed to take today's dose.  Patient verbalized understanding of these instructions.       FOLLOW-UP Return in 4 weeks (on 11/28/2012) for Follow up INR at 0930h.  Hulen Luster, III Pharm.D., CACP

## 2012-10-31 NOTE — Patient Instructions (Signed)
Patient instructed to take medications as defined in the Anti-coagulation Track section of this encounter.  Patient instructed to take today's dose.  Patient verbalized understanding of these instructions.    

## 2012-11-08 ENCOUNTER — Encounter: Payer: Self-pay | Admitting: Cardiology

## 2012-11-08 NOTE — Progress Notes (Signed)
Patient ID: [name], male   DOB: Dec 21, 1945, 67 y.o.   MRN: [id number]   Ryan, Hamilton    Date of visit:  11/08/2012 DOB:  [birth year]    Age:  67 yrs. Medical record number:  [id number]     Account number:  [id number] ____________________________ CURRENT DIAGNOSES  1. Cardiomyopathy Idiopathic  2. Personal History Of Noncompliance With Medical Treatment Presenting Hazards To Health  3. Long-term (current) use of anticoagulants  4. Obesity(BMI30-40)  5. Atrial Fibrillation ____________________________ ALLERGIES  codeine sulfate ____________________________ MEDICATIONS  1. lisinopril 5 mg Tablet, 1 p.o. daily  2. warfarin 5 mg tablet, Take as directed  3. amiodarone 200 mg tablet, 1 p.o. daily  4. carvedilol 12.5 mg tablet, BID ____________________________ CHIEF COMPLAINTS  Followup of Atrial Fibrillation  Followup of Cardiomyopathy Idiopathic ____________________________ HISTORY OF PRESENT ILLNESS  Patient seen for cardiac followup. Since he was previously here he took furosemide and has lost a lot of weight and no longer has edema. He stopped taking it. He has had no bleeding complications from warfarin. He notes his dyspnea has improved but he continues to smoke. He denies PND, orthopnea or edema. He has been taking his carvedilol at a higher dose since he was here. ____________________________ PAST HISTORY  Past Medical Illnesses:  obesity, polycythemia-secondary;  Cardiovascular Illnesses:  atrial flutter, cardiomyopathy(dilated);  Surgical Procedures:  hemmoroidectomy;  Cardiology Procedures-Invasive:  RF ablation for atrial flutter February 2012;  Cardiology Procedures-Noninvasive:  echocardiogram February 2014;  LVEF of 40% documented via echocardiogram on 08/31/2012,   ____________________________ CARDIO-PULMONARY TEST DATES EKG Date:  10/05/2012;  Echocardiography Date: 08/31/2012;  Chest Xray Date: 08/30/2012;   ____________________________ SOCIAL HISTORY Alcohol  Use:  history of alcohol abuse, currently not drinking;  Smoking:  smokes cigarettes, less than 1 ppd;  Diet:  regular diet;  Lifestyle:  single;  Exercise:  no regular exercise;  Occupation:  retired;  Residence:  lives with roommate;   ____________________________ REVIEW OF SYSTEMS General:  obesity, weight loss of approximately 20 lbs Eyes: denies diplopia, history of glaucoma or visual problems. Respiratory: denies dyspnea, cough, wheezing or hemoptysis. Cardiovascular:  please review HPI Abdominal: dyspepsia Musculoskeletal:  denies arthritis, venous insufficiency, or muscle weakness. Neurological:  denies headaches, stroke, or TIA  ____________________________ PHYSICAL EXAMINATION VITAL SIGNS  Blood Pressure:  110/70 Sitting, Right arm, regular cuff  , 110/70 Standing, Right arm and regular cuff   Pulse:  76/min. Weight:  172.00 lbs. Height:  66"BMI: 28  Constitutional:  pleasant Hispanic male in no acute distress, mildly obese Skin:  warm and dry to touch, no apparent skin lesions, or masses noted. Head:  normocephalic, normal hair pattern, no masses or tenderness ENT:  ears, nose and throat reveal no gross abnormalities.  Dentition good. Neck:  supple, without massess. No JVD, thyromegaly or carotid bruits. Carotid upstroke normal. Chest:  normal symmetry, clear to auscultation and percussion. Cardiac:  regular rhythm, normal S1 and S2, No S3 or S4, no murmurs, gallops or rubs detected. Peripheral Pulses:  the femoral,dorsalis pedis, and posterior tibial pulses are full and equal bilaterally with no bruits auscultated. Extremities & Back:  no edema present Neurological:  no gross motor or sensory deficits noted, affect appropriate, oriented x3. ____________________________ MOST RECENT LIPID PANEL 09/08/10  CHOL TOTL 143 mg/dl, LDL 98 NM, HDL 34 mg/dl, TRIGLYCER 57 mg/dl and CHOL/HDL 4.2 (Calc) ____________________________ IMPRESSIONS/PLAN  1. Cardiomyopathy idiopathic 2. History  of atrial fibrillation 3. Long-term anticoagulation with warfarin 4. Chronic amiodarone therapy  Recommendations:  Discussed  importance of compliance. We discussed stopping smoking and ways to do so. Followup in 6 months and call if problems. ____________________________ TODAYS ORDERS  1. Return Visit: 3 months  2. Basic Metabolic Panel: Today                       ____________________________ Cardiology Physician:  Darden Palmer MD [facility]

## 2012-11-28 ENCOUNTER — Ambulatory Visit (INDEPENDENT_AMBULATORY_CARE_PROVIDER_SITE_OTHER): Payer: Medicare Other | Admitting: Pharmacist

## 2012-11-28 DIAGNOSIS — Z7901 Long term (current) use of anticoagulants: Secondary | ICD-10-CM

## 2012-11-28 DIAGNOSIS — I4891 Unspecified atrial fibrillation: Secondary | ICD-10-CM

## 2012-11-28 NOTE — Progress Notes (Signed)
Anti-Coagulation Progress Note  Abner Ardis is a 67 y.o. male who is currently on an anti-coagulation regimen.    RECENT RESULTS: Recent results are below, the most recent result is correlated with a dose of 27.5 mg. per week: Lab Results  Component Value Date   INR 3.00 11/28/2012   INR 2.00 10/31/2012   INR 2.30 10/10/2012    ANTI-COAG DOSE: Anticoagulation Dose Instructions as of 11/28/2012     Glynis Smiles Tue Wed Thu Fri Sat   New Dose 5 mg 2.5 mg 5 mg 2.5 mg 5 mg 2.5 mg 5 mg       ANTICOAG SUMMARY: Anticoagulation Episode Summary   Current INR goal 2.0-3.0  Next INR check 12/26/2012  INR from last check 3.00 (11/28/2012)  Weekly max dose   Target end date Indefinite  [facility]  Preferred lab   Send INR reminders to    Indications  Atrial fibrillation [427.31] Long term (current) use of anticoagulants [V58.61]        Comments         ANTICOAG TODAY: Anticoagulation Summary as of 11/28/2012   INR goal 2.0-3.0  Selected INR 3.00 (11/28/2012)  Next INR check 12/26/2012  Target end date Indefinite   Indications  Atrial fibrillation [427.31] Long term (current) use of anticoagulants [V58.61]      Anticoagulation Episode Summary   [facility]   Preferred lab    Send INR reminders to    Comments       PATIENT INSTRUCTIONS: Patient Instructions  Patient instructed to take medications as defined in the Anti-coagulation Track section of this encounter.  Patient instructed to take today's dose.  Patient verbalized understanding of these instructions.       FOLLOW-UP Return in 4 weeks (on 12/26/2012) for Follow up INR at 0930h.  Hulen Luster, III Pharm.D., CACP

## 2012-11-28 NOTE — Progress Notes (Signed)
Indication: Atrial fibrillation.  Duration: Lifelong.  INR at goal.  Agree with Dr. Saralyn Pilar assessment and plan as documented.

## 2012-11-28 NOTE — Patient Instructions (Signed)
Patient instructed to take medications as defined in the Anti-coagulation Track section of this encounter.  Patient instructed to take today's dose.  Patient verbalized understanding of these instructions.    

## 2012-12-05 ENCOUNTER — Other Ambulatory Visit: Payer: Self-pay | Admitting: *Deleted

## 2012-12-05 MED ORDER — AMIODARONE HCL 200 MG PO TABS: 200.0000 mg | ORAL_TABLET | Freq: Every day | ORAL | Status: DC

## 2012-12-05 MED ORDER — LISINOPRIL 5 MG PO TABS: 5.0000 mg | ORAL_TABLET | Freq: Every day | ORAL | Status: DC

## 2012-12-14 ENCOUNTER — Observation Stay (HOSPITAL_COMMUNITY)
Admission: EM | Admit: 2012-12-14 | Discharge: 2012-12-15 | Disposition: A | Discharge status: Home / Self Care | Payer: Medicare Other | Attending: Internal Medicine | Admitting: Internal Medicine

## 2012-12-14 ENCOUNTER — Encounter (HOSPITAL_COMMUNITY): Payer: Self-pay | Admitting: Emergency Medicine

## 2012-12-14 ENCOUNTER — Emergency Department (HOSPITAL_COMMUNITY): Payer: Medicare Other

## 2012-12-14 DIAGNOSIS — F1010 Alcohol abuse, uncomplicated: Secondary | ICD-10-CM | POA: Insufficient documentation

## 2012-12-14 DIAGNOSIS — I5040 Unspecified combined systolic (congestive) and diastolic (congestive) heart failure: Secondary | ICD-10-CM | POA: Diagnosis present

## 2012-12-14 DIAGNOSIS — Z79899 Other long term (current) drug therapy: Secondary | ICD-10-CM | POA: Insufficient documentation

## 2012-12-14 DIAGNOSIS — F1020 Alcohol dependence, uncomplicated: Secondary | ICD-10-CM | POA: Diagnosis present

## 2012-12-14 DIAGNOSIS — J449 Chronic obstructive pulmonary disease, unspecified: Secondary | ICD-10-CM

## 2012-12-14 DIAGNOSIS — Z23 Encounter for immunization: Secondary | ICD-10-CM | POA: Insufficient documentation

## 2012-12-14 DIAGNOSIS — F17200 Nicotine dependence, unspecified, uncomplicated: Secondary | ICD-10-CM | POA: Insufficient documentation

## 2012-12-14 DIAGNOSIS — I10 Essential (primary) hypertension: Secondary | ICD-10-CM | POA: Insufficient documentation

## 2012-12-14 DIAGNOSIS — I5042 Chronic combined systolic (congestive) and diastolic (congestive) heart failure: Secondary | ICD-10-CM | POA: Insufficient documentation

## 2012-12-14 DIAGNOSIS — R0602 Shortness of breath: Principal | ICD-10-CM | POA: Insufficient documentation

## 2012-12-14 DIAGNOSIS — Z6826 Body mass index (BMI) 26.0-26.9, adult: Secondary | ICD-10-CM | POA: Insufficient documentation

## 2012-12-14 DIAGNOSIS — Z720 Tobacco use: Secondary | ICD-10-CM | POA: Diagnosis present

## 2012-12-14 DIAGNOSIS — Z7901 Long term (current) use of anticoagulants: Secondary | ICD-10-CM | POA: Insufficient documentation

## 2012-12-14 DIAGNOSIS — I509 Heart failure, unspecified: Secondary | ICD-10-CM | POA: Insufficient documentation

## 2012-12-14 DIAGNOSIS — E440 Moderate protein-calorie malnutrition: Secondary | ICD-10-CM | POA: Insufficient documentation

## 2012-12-14 DIAGNOSIS — I426 Alcoholic cardiomyopathy: Secondary | ICD-10-CM | POA: Diagnosis present

## 2012-12-14 DIAGNOSIS — R0600 Dyspnea, unspecified: Secondary | ICD-10-CM

## 2012-12-14 DIAGNOSIS — R748 Abnormal levels of other serum enzymes: Secondary | ICD-10-CM | POA: Insufficient documentation

## 2012-12-14 DIAGNOSIS — I4891 Unspecified atrial fibrillation: Secondary | ICD-10-CM | POA: Insufficient documentation

## 2012-12-14 HISTORY — DX: Gastro-esophageal reflux disease without esophagitis: K21.9

## 2012-12-14 HISTORY — DX: Inflammatory liver disease, unspecified: K75.9

## 2012-12-14 LAB — CBC WITH DIFFERENTIAL/PLATELET
Basophils Absolute: 0.1 10*3/uL (ref 0.0–0.1)
HCT: 51.5 % (ref 39.0–52.0)
Hemoglobin: 17.5 g/dL — ABNORMAL HIGH (ref 13.0–17.0)
Lymphocytes Relative: 5 % — ABNORMAL LOW (ref 12–46)
Lymphs Abs: 0.9 10*3/uL (ref 0.7–4.0)
Monocytes Absolute: 1 10*3/uL (ref 0.1–1.0)
Monocytes Relative: 6 % (ref 3–12)
Neutro Abs: 15.3 10*3/uL — ABNORMAL HIGH (ref 1.7–7.7)
RBC: 5.43 MIL/uL (ref 4.22–5.81)
RDW: 16.5 % — ABNORMAL HIGH (ref 11.5–15.5)
WBC: 17.8 10*3/uL — ABNORMAL HIGH (ref 4.0–10.5)

## 2012-12-14 LAB — COMPREHENSIVE METABOLIC PANEL
AST: 42 U/L — ABNORMAL HIGH (ref 0–37)
CO2: 29 mEq/L (ref 19–32)
Chloride: 105 mEq/L (ref 96–112)
Creatinine, Ser: 0.82 mg/dL (ref 0.50–1.35)
GFR calc non Af Amer: >90 mL/min (ref >90)
Total Bilirubin: 0.6 mg/dL (ref 0.3–1.2)

## 2012-12-14 LAB — MAGNESIUM: Magnesium: 1.7 mg/dL (ref 1.5–2.5)

## 2012-12-14 LAB — LIPID PANEL
HDL: 53 mg/dL (ref >39)
LDL Cholesterol: 103 mg/dL — ABNORMAL HIGH (ref 0–99)
Total CHOL/HDL Ratio: 3.3 RATIO
VLDL: 20 mg/dL (ref 0–40)

## 2012-12-14 LAB — PROTIME-INR: Prothrombin Time: 36.4 seconds — ABNORMAL HIGH (ref 11.6–15.2)

## 2012-12-14 MED ORDER — SODIUM CHLORIDE 0.9 % IJ SOLN: 3.0000 mL | INTRAMUSCULAR | Status: DC | PRN

## 2012-12-14 MED ORDER — ONDANSETRON HCL 4 MG PO TABS: 4.0000 mg | ORAL_TABLET | Freq: Four times a day (QID) | ORAL | Status: DC | PRN

## 2012-12-14 MED ORDER — ALBUTEROL SULFATE (5 MG/ML) 0.5% IN NEBU
INHALATION_SOLUTION | RESPIRATORY_TRACT | Status: AC
  Administered 2012-12-14: 5 mg
  Filled 2012-12-14: qty 1

## 2012-12-14 MED ORDER — IPRATROPIUM BROMIDE 0.02 % IN SOLN: 0.5000 mg | Freq: Four times a day (QID) | RESPIRATORY_TRACT | Status: DC | PRN

## 2012-12-14 MED ORDER — LORAZEPAM 0.5 MG PO TABS: 1.0000 mg | ORAL_TABLET | Freq: Four times a day (QID) | ORAL | Status: DC | PRN

## 2012-12-14 MED ORDER — ALBUTEROL SULFATE (5 MG/ML) 0.5% IN NEBU
INHALATION_SOLUTION | RESPIRATORY_TRACT | Status: AC
  Filled 2012-12-14: qty 1

## 2012-12-14 MED ORDER — PNEUMOCOCCAL VAC POLYVALENT 25 MCG/0.5ML IJ INJ
0.5000 mL | INJECTION | INTRAMUSCULAR | Status: AC
  Administered 2012-12-15: 0.5 mL via INTRAMUSCULAR
  Filled 2012-12-14: qty 0.5

## 2012-12-14 MED ORDER — FUROSEMIDE 40 MG PO TABS
40.0000 mg | ORAL_TABLET | Freq: Every day | ORAL | Status: DC
  Administered 2012-12-15: 40 mg via ORAL
  Filled 2012-12-14: qty 1

## 2012-12-14 MED ORDER — PREDNISONE 50 MG PO TABS
60.0000 mg | ORAL_TABLET | Freq: Every day | ORAL | Status: DC
  Administered 2012-12-15: 60 mg via ORAL
  Filled 2012-12-14 (×2): qty 1

## 2012-12-14 MED ORDER — ONDANSETRON HCL 4 MG/2ML IJ SOLN: 4.0000 mg | Freq: Four times a day (QID) | INTRAMUSCULAR | Status: DC | PRN

## 2012-12-14 MED ORDER — SODIUM CHLORIDE 0.9 % IV SOLN: 250.0000 mL | INTRAVENOUS | Status: DC | PRN

## 2012-12-14 MED ORDER — AMIODARONE HCL 200 MG PO TABS
200.0000 mg | ORAL_TABLET | Freq: Every day | ORAL | Status: DC
  Administered 2012-12-15: 200 mg via ORAL
  Filled 2012-12-14: qty 1

## 2012-12-14 MED ORDER — LORAZEPAM 2 MG/ML IJ SOLN: 1.0000 mg | Freq: Four times a day (QID) | INTRAMUSCULAR | Status: DC | PRN

## 2012-12-14 MED ORDER — ALBUTEROL SULFATE (5 MG/ML) 0.5% IN NEBU
5.0000 mg | INHALATION_SOLUTION | Freq: Once | RESPIRATORY_TRACT | Status: AC
  Administered 2012-12-14: 5 mg via RESPIRATORY_TRACT

## 2012-12-14 MED ORDER — CARVEDILOL 12.5 MG PO TABS
12.5000 mg | ORAL_TABLET | Freq: Two times a day (BID) | ORAL | Status: DC
  Administered 2012-12-15: 12.5 mg via ORAL
  Filled 2012-12-14 (×3): qty 1

## 2012-12-14 MED ORDER — LISINOPRIL 5 MG PO TABS
5.0000 mg | ORAL_TABLET | Freq: Every day | ORAL | Status: DC
  Administered 2012-12-15: 5 mg via ORAL
  Filled 2012-12-14: qty 1

## 2012-12-14 MED ORDER — ENOXAPARIN SODIUM 40 MG/0.4ML ~~LOC~~ SOLN
40.0000 mg | SUBCUTANEOUS | Status: DC
  Administered 2012-12-14: 40 mg via SUBCUTANEOUS
  Filled 2012-12-14 (×2): qty 0.4

## 2012-12-14 MED ORDER — ADULT MULTIVITAMIN W/MINERALS CH
1.0000 | ORAL_TABLET | Freq: Every day | ORAL | Status: DC
  Administered 2012-12-14 – 2012-12-15 (×2): 1 via ORAL
  Filled 2012-12-14 (×2): qty 1

## 2012-12-14 MED ORDER — SODIUM CHLORIDE 0.9 % IJ SOLN
3.0000 mL | Freq: Two times a day (BID) | INTRAMUSCULAR | Status: DC
  Administered 2012-12-14 – 2012-12-15 (×2): 3 mL via INTRAVENOUS

## 2012-12-14 MED ORDER — VITAMIN B-1 100 MG PO TABS
100.0000 mg | ORAL_TABLET | Freq: Every day | ORAL | Status: DC
  Administered 2012-12-14 – 2012-12-15 (×2): 100 mg via ORAL
  Filled 2012-12-14 (×2): qty 1

## 2012-12-14 MED ORDER — FUROSEMIDE 10 MG/ML IJ SOLN
60.0000 mg | Freq: Once | INTRAMUSCULAR | Status: AC
  Administered 2012-12-14: 60 mg via INTRAVENOUS
  Filled 2012-12-14: qty 6

## 2012-12-14 MED ORDER — GUAIFENESIN ER 600 MG PO TB12
600.0000 mg | ORAL_TABLET | Freq: Two times a day (BID) | ORAL | Status: DC
  Administered 2012-12-14 – 2012-12-15 (×2): 600 mg via ORAL
  Filled 2012-12-14 (×3): qty 1

## 2012-12-14 MED ORDER — PREDNISONE 20 MG PO TABS
60.0000 mg | ORAL_TABLET | Freq: Once | ORAL | Status: AC
  Administered 2012-12-14: 60 mg via ORAL
  Filled 2012-12-14: qty 3

## 2012-12-14 MED ORDER — SODIUM CHLORIDE 0.9 % IJ SOLN
3.0000 mL | Freq: Two times a day (BID) | INTRAMUSCULAR | Status: DC
  Administered 2012-12-15: 3 mL via INTRAVENOUS

## 2012-12-14 MED ORDER — IPRATROPIUM BROMIDE 0.02 % IN SOLN
RESPIRATORY_TRACT | Status: AC
  Administered 2012-12-14: 0.5 mg
  Filled 2012-12-14: qty 2.5

## 2012-12-14 MED ORDER — FOLIC ACID 1 MG PO TABS
1.0000 mg | ORAL_TABLET | Freq: Every day | ORAL | Status: DC
  Administered 2012-12-14 – 2012-12-15 (×2): 1 mg via ORAL
  Filled 2012-12-14 (×2): qty 1

## 2012-12-14 MED ORDER — LEVOFLOXACIN 500 MG PO TABS
500.0000 mg | ORAL_TABLET | Freq: Once | ORAL | Status: AC
  Administered 2012-12-14: 500 mg via ORAL
  Filled 2012-12-14: qty 1

## 2012-12-14 MED ORDER — THIAMINE HCL 100 MG/ML IJ SOLN
100.0000 mg | Freq: Every day | INTRAMUSCULAR | Status: DC
  Filled 2012-12-14 (×2): qty 1

## 2012-12-14 MED ORDER — ALBUTEROL SULFATE (5 MG/ML) 0.5% IN NEBU: 5.0000 mg | INHALATION_SOLUTION | Freq: Four times a day (QID) | RESPIRATORY_TRACT | Status: DC | PRN

## 2012-12-14 NOTE — H&P (Signed)
Hospital Admission Note Date: 12/14/2012  Patient name: [name] Medical record number: [id number] Date of birth: September 17, 1945 Age: 67 y.o. Gender: male PCP: Annett Gula, MD  Medical Service: IMTS  Attending physician:  Dr. Rogelia Boga    1st Contact: Dr. Burtis Junes   Pager: [phone] 2nd Contact: Dr. Dierdre Searles    Pager: [phone] After 5 pm or weekends: 1st Contact:      Pager: [phone] 2nd Contact:      Pager: [phone]  Chief Complaint: SOB  History of Present Illness: 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 Pt states was sitting in chair when felt couldn't catch breath and then walked over to a window to "help get some air" and heard some audible wheezing. All symptoms started on Saturday with some PND, rhinorrhea, and cough w/o productive sputum. Then pt had some worsening SOB while sitting in chair and then some diaphoresis morning of admission and decided to present to ED. Pt had not had PND, increase in cough from baseline, . Pt continues to be a smoker and around ppl whom smoke daily. Pt denied any fever/chills, nausea/vomiting/diarrhea, abdominal pain, CP, DOE, weight gain, or LE edema. Pt states that he was a former heavy EtOH drinker and only had 1 beer over the holiday weekend. Pt denied any long trips and reports compliance with his "4 drugs."   Meds: Current Facility-Administered Medications  Medication Dose Route Frequency Provider Last Rate Last Dose  . albuterol (PROVENTIL) (5 MG/ML) 0.5% nebulizer solution            Current Outpatient Prescriptions  Medication Sig Dispense Refill  . amiodarone (PACERONE) 200 MG tablet Take 1 tablet (200 mg total) by mouth daily.  30 tablet  2  . carvedilol (COREG) 12.5 MG tablet Take 1 tablet (12.5 mg total) by mouth 2 (two) times daily with a meal.   180 tablet  1  . lisinopril (PRINIVIL,ZESTRIL) 5 MG tablet Take 1 tablet (5 mg total) by mouth daily.  30 tablet  2  . warfarin (COUMADIN) 5 MG tablet Take 2.5-5 mg by mouth daily. Take 5 mg (a whole tablet) on Tuesday and Wednesday. On every other day of the week, take 2.5 mg (a half tablet).        Allergies: Allergies as of 12/14/2012 - Review Complete 12/14/2012  Allergen Reaction Noted  . Codeine Rash 09/30/2010   Past Medical History  Diagnosis Date  . Cardiomyopathy   . Substance abuse     alcohol  . Hypertension   . Atrial flutter      status post ablation on 09/11/2010  . CHF (congestive heart failure)     EF 35% with diffuse hypokinesis  . Atrial fibrillation    Past Surgical History  Procedure Laterality Date  . Hemorrhoid surgery     Family History  Problem Relation Age of Onset  . Diabetes Mother   . Hypertension Mother   . Cirrhosis Father   . Alcohol abuse Father    History   Social History  . Marital Status: Single    Spouse Name: N/A    Number of Children: N/A  . Years of Education: N/A   Occupational History  . Not on file.   Social History Main Topics  . Smoking status: Current Some Day Smoker -- 0.10  packs/day for 40 years    Types: Cigarettes  . Smokeless tobacco: Never Used     Comment: cut back to 1 cig. a day.  . Alcohol Use: 2.0 oz/week    4 drink(s) per week  . Drug Use: No     Comment: former coacaine user  . Sexually Active: Yes   Other Topics Concern  . Not on file   Social History Narrative   Moved here from [place]. Lives with common law wife and grandchildren.    Review of Systems: A comprehensive review of systems was negative.  Physical Exam: Blood pressure 134/81, pulse 57, temperature 98.3 F (36.8 C), resp. rate 18, weight 164 lb (74.39 kg), SpO2 96.00%. General: resting in bed, NAD HEENT: PERRL, EOMI, no scleral icterus Cardiac: RRR, no rubs, murmurs or gallops Pulm: clear to auscultation bilaterally with  minimal fine crackles in bases, moving normal volumes of air Abd: soft, nontender, nondistended, BS present Ext: warm and well perfused, no pedal edema Neuro: alert and oriented X3, cranial nerves II-XII grossly intact   Lab results: Basic Metabolic Panel:  Recent Labs  54/09/81 1200  NA 143  K 4.0  CL 105  CO2 29  GLUCOSE 100*  BUN 15  CREATININE 0.82  CALCIUM 8.9   Liver Function Tests:  Recent Labs  12/14/12 1200  AST 42*  ALT 28  ALKPHOS 174*  BILITOT 0.6  PROT 7.0  ALBUMIN 3.2*   CBC:  Recent Labs  12/14/12 1200  WBC 17.8*  NEUTROABS 15.3*  HGB 17.5*  HCT 51.5  MCV 94.8  PLT 283   BNP:  Recent Labs  12/14/12 1202  PROBNP 1556.0*   Coagulation:  Recent Labs  12/14/12 1200  LABPROT 36.4*  INR 3.97*   Urine Drug Screen: Drugs of Abuse     Component Value Date/Time   LABOPIA POSITIVE* 09/07/2010 0910   COCAINSCRNUR NONE DETECTED 09/07/2010 0910   LABBENZ NONE DETECTED 09/07/2010 0910   AMPHETMU NONE DETECTED 09/07/2010 0910   THCU NONE DETECTED 09/07/2010 0910   LABBARB  Value: NONE DETECTED        DRUG SCREEN FOR MEDICAL PURPOSES ONLY.  IF CONFIRMATION IS NEEDED FOR ANY PURPOSE, NOTIFY LAB WITHIN 5 DAYS.        LOWEST DETECTABLE LIMITS FOR URINE DRUG SCREEN Drug Class       Cutoff (ng/mL) Amphetamine      1000 Barbiturate      200 Benzodiazepine   200 Tricyclics       300 Opiates          300 Cocaine          300 THC              50 09/07/2010 0910    Imaging results:  Dg Chest 2 View  12/14/2012   *RADIOLOGY REPORT*  Clinical Data: Shortness of breath Saturday.  Smoker for 40 years.  CHEST - 2 VIEW  Comparison: 09/11/2012  Findings: Midline trachea.  Borderline cardiomegaly. Mediastinal contours otherwise within normal limits.  No pleural effusion or pneumothorax.  No congestive failure.  Clear lungs.  IMPRESSION: Borderline cardiomegaly, without acute disease.   Original Report Authenticated By: Jeronimo Greaves, M.D.    Other results: EKG: normal  EKG, normal sinus rhythm, normal sinus rhythm.  Assessment & Plan by Problem: # Dyspnea likely 2/2 URI: Pt not hypoxic and hx doesn't suggest current CHF exacerbation. Pt has actually had a dramatic weight loss of 20 lbs from prevbious baseline  over the past 3 months. CXR no acute cardiopulmonary process and was directly discussed with radiologist that indicated not even classic COPD findings. Pt revised Geneva score 1.0 making PE less likely.  -schedule duonebs -trops x 3 -repeat EKG in AM  #Aflutter s/p ablation: pt currently in NSR. Therapeutic on INR 3.4.  -cont to monitor and coumadin  Dispo: Disposition is deferred at this time, awaiting improvement of current medical problems. Anticipated discharge in approximately 1-2 day(s).   The patient does have a current PCP Shirlee Latch, Pasty Spillers, MD), therefore will be requiring [facility] follow-up after discharge.   The patient does not have transportation limitations that hinder transportation to clinic appointments.  SignedChristen Bame 12/14/2012, 4:46 PM  Pgr: [phone]

## 2012-12-14 NOTE — ED Notes (Signed)
Pt states the breathing tx he received helped his breathing.

## 2012-12-14 NOTE — Consult Note (Signed)
ANTICOAGULATION CONSULT NOTE - Initial Consult  Pharmacy Consult:  Coumadin Indication: History of A-flutter on chronic anticoagulation for VTE prophylaxis   Allergies: Allergies  Allergen Reactions  . Codeine Rash    Height/Weight: Weight: 164 lb (74.39 kg) Dosing weight 74 kg  Vital Signs: BP 105/68  Pulse 61  Temp(Src) 98.3 F (36.8 C)  Resp 17  Wt 164 lb (74.39 kg)  BMI 26.48 kg/m2  SpO2 95%  Active Problems: Principal Problem:   Dyspnea Active Problems:   Atrial fibrillation   Cardiomyopathy, alcoholic   Alcohol abuse   Hypertension   Idiopathic cardiomyopathy   Tobacco abuse   Elevated liver enzymes   Long term (current) use of anticoagulants   Labs:  Recent Labs  12/14/12 1200  HGB 17.5*  HCT 51.5  PLT 283  LABPROT 36.4*  INR 3.97*  CREATININE 0.82   Lab Results  Component Value Date   INR 3.97* 12/14/2012   INR 3.00 11/28/2012   INR 2.00 10/31/2012   Medical / Surgical History: Past Medical History  Diagnosis Date  . Cardiomyopathy   . Substance abuse     alcohol  . Hypertension   . Atrial flutter      status post ablation on 09/11/2010  . CHF (congestive heart failure)     EF 35% with diffuse hypokinesis  . Atrial fibrillation    Past Surgical History  Procedure Laterality Date  . Hemorrhoid surgery      Medications:  Prescriptions prior to admission  Medication Sig Dispense Refill  . amiodarone (PACERONE) 200 MG tablet Take 1 tablet (200 mg total) by mouth daily.  30 tablet  2  . carvedilol (COREG) 12.5 MG tablet Take 1 tablet (12.5 mg total) by mouth 2 (two) times daily with a meal.  180 tablet  1  . lisinopril (PRINIVIL,ZESTRIL) 5 MG tablet Take 1 tablet (5 mg total) by mouth daily.  30 tablet  2  . warfarin (COUMADIN) 5 MG tablet Take 2.5-5 mg by mouth daily. Take 5 mg (a whole tablet) on Tuesday and Wednesday. On every other day of the week, take 2.5 mg (a half tablet).       Scheduled:  . albuterol      . amiodarone  200  mg Oral Daily  . [START ON 12/15/2012] carvedilol  12.5 mg Oral BID WC  . enoxaparin (LOVENOX) injection  40 mg Subcutaneous Q24H  . [START ON 12/15/2012] furosemide  40 mg Oral Daily  . guaiFENesin  600 mg Oral BID  . lisinopril  5 mg Oral Daily  . [START ON 12/15/2012] predniSONE  60 mg Oral Q breakfast  . sodium chloride  3 mL Intravenous Q12H  . sodium chloride  3 mL Intravenous Q12H   Assessment:  67 y.o.male with a history of A-fib/flutter s/p ablation on chronic Coumadin for VTE prophylaxis .  Home dose of Coumadin is 2.5 mg daily except 5 mg on Tue, Wed.  Last dose of Coumadin taken today.  Admission INR 3.97.  Patient is also taking Amiodarone 200 mg daily.  Goal of Therapy:   INR 2-3      Plan:   No additional Coumadin today. Daily INR's, CBC. Monitor for bleeding complications   Laurena Bering,  Pharm.D.. 12/14/2012,  6:41 PM

## 2012-12-14 NOTE — ED Provider Notes (Addendum)
History     CSN: [id number]  Arrival date & time 12/14/12  1018   First MD Initiated Contact with Patient 12/14/12 1144      Chief Complaint  Patient presents with  . Shortness of Breath    (Consider location/radiation/quality/duration/timing/severity/associated sxs/prior treatment) HPI 67 y.o. Male history of hypertension, a fib, chf, smoker presents today with onset of dyspnea this am at 0930 while at friend's house.  Dyspnea improved here with nebulizer from given here.  Patient states associated diaphoresis.  Denies uri, chest pain, nausea, fever, chills, leg swelling, h.o. Dvt, or risk factors.  Some increase in cough unknown time frame.  Some yellow sputum production.  Patient is smoker.  [facility].   Past Medical History  Diagnosis Date  . Cardiomyopathy   . Substance abuse     alcohol  . Hypertension   . Atrial flutter      status post ablation on 09/11/2010  . CHF (congestive heart failure)     EF 35% with diffuse hypokinesis  . Atrial fibrillation     Past Surgical History  Procedure Laterality Date  . Hemorrhoid surgery      Family History  Problem Relation Age of Onset  . Diabetes Mother   . Hypertension Mother   . Cirrhosis Father   . Alcohol abuse Father     History  Substance Use Topics  . Smoking status: Current Some Day Smoker -- 0.10 packs/day for 40 years    Types: Cigarettes  . Smokeless tobacco: Never Used     Comment: cut back to 1 cig. a day.  . Alcohol Use: 2.0 oz/week    4 drink(s) per week      Review of Systems  All other systems reviewed and are negative.    Allergies  Codeine  Home Medications   Current Outpatient Rx  Name  Route  Sig  Dispense  Refill  . amiodarone (PACERONE) 200 MG tablet   Oral   Take 1 tablet (200 mg total) by mouth daily.   30 tablet   2   . carvedilol (COREG) 12.5 MG tablet   Oral   Take 1 tablet (12.5 mg total) by mouth 2 (two) times daily with a meal.   180 tablet   1   . lisinopril (PRINIVIL,ZESTRIL) 5 MG tablet   Oral   Take 1 tablet (5 mg total) by mouth daily.   30 tablet   2   . warfarin (COUMADIN) 5 MG tablet      Take 5 mg Tuesday, Wednesday, then 2.5 mg Thurs, then as instructed   30 tablet   1     BP 153/74  Pulse 66  Temp(Src) 98.3 F (36.8 C)  Resp 22  SpO2 91%  Physical Exam  Nursing note and vitals reviewed. Constitutional: He is oriented to person, place, and time. He appears well-developed and well-nourished.  HENT:  Head: Normocephalic and atraumatic.  Right Ear: External ear normal.  Left Ear: External ear normal.  Nose: Nose normal.  Mouth/Throat: Oropharynx is clear and moist.  Eyes: Conjunctivae and EOM are normal. Pupils are equal, round, and reactive to light.  Neck: Normal range of motion. Neck supple. No JVD present.  Cardiovascular: Normal rate, normal heart sounds and intact distal pulses.   Pulmonary/Chest: He has wheezes. He has rhonchi.  Abdominal: Soft. Bowel sounds are normal.  Musculoskeletal: He exhibits no tenderness.  Trace edema bilateral lower extremities  Neurological: He is alert and oriented to  person, place, and time. He has normal reflexes.  Skin: Skin is warm and dry.  Psychiatric: He has a normal mood and affect. His behavior is normal. Thought content normal.    ED Course  Procedures (including critical care time)  Labs Reviewed  CBC WITH DIFFERENTIAL  COMPREHENSIVE METABOLIC PANEL  PRO B NATRIURETIC PEPTIDE  PROTIME-INR   No results found.   No diagnosis found.   Date: 12/14/2012  Rate: 54  Rhythm: normal sinus rhythm  QRS Axis: right  Intervals: normal and PR prolonged  ST/T Wave abnormalities: ST elevations anteriorly  Conduction Disutrbances:none  Narrative Interpretation:   Old EKG Reviewed: changes noted 2/14    MDM  Patient with onset of acute dyspnea.  Patient with some wheezing responsive to albuterol and patient is smoker.  He has an elevated bnp and  leukocytosis with no acute ekg or cxr changes.  Patient is treated with albuterol, lasix, levaquin, and prednisone.  No s/s mi, dvt, pe.  Plan consult to internal medicine teaching service for admssion vs obs.       Hilario Quarry, MD 12/14/12 1452  Hilario Quarry, MD 12/14/12 1710

## 2012-12-14 NOTE — ED Notes (Signed)
Sob and congestion that started this  Morning

## 2012-12-14 NOTE — ED Notes (Signed)
Pt states he had difficulty breathing. Pt states he was admitted for this before. Pt denies any hx of asthma or bronchitis. Pt is a smoker, states he is trying to quit and has been smoking for 20-33yrs.

## 2012-12-15 DIAGNOSIS — D72829 Elevated white blood cell count, unspecified: Secondary | ICD-10-CM

## 2012-12-15 DIAGNOSIS — J984 Other disorders of lung: Secondary | ICD-10-CM

## 2012-12-15 DIAGNOSIS — I5042 Chronic combined systolic (congestive) and diastolic (congestive) heart failure: Secondary | ICD-10-CM

## 2012-12-15 DIAGNOSIS — D45 Polycythemia vera: Secondary | ICD-10-CM

## 2012-12-15 DIAGNOSIS — E440 Moderate protein-calorie malnutrition: Secondary | ICD-10-CM | POA: Insufficient documentation

## 2012-12-15 LAB — CBC
HCT: 49.2 % (ref 39.0–52.0)
MCH: 32.5 pg (ref 26.0–34.0)
MCHC: 35 g/dL (ref 30.0–36.0)
MCV: 93 fL (ref 78.0–100.0)
Platelets: 262 10*3/uL (ref 150–400)
RDW: 16.1 % — ABNORMAL HIGH (ref 11.5–15.5)

## 2012-12-15 LAB — RAPID URINE DRUG SCREEN, HOSP PERFORMED
Amphetamines: NOT DETECTED
Barbiturates: NOT DETECTED
Cocaine: NOT DETECTED
Opiates: POSITIVE — AB
Tetrahydrocannabinol: NOT DETECTED

## 2012-12-15 LAB — BASIC METABOLIC PANEL
BUN: 21 mg/dL (ref 6–23)
CO2: 30 mEq/L (ref 19–32)
Calcium: 9.1 mg/dL (ref 8.4–10.5)
Chloride: 97 mEq/L (ref 96–112)
Creatinine, Ser: 1.01 mg/dL (ref 0.50–1.35)
GFR calc Af Amer: 88 mL/min — ABNORMAL LOW (ref >90)
Glucose, Bld: 109 mg/dL — ABNORMAL HIGH (ref 70–99)

## 2012-12-15 LAB — TECHNOLOGIST SMEAR REVIEW

## 2012-12-15 LAB — TROPONIN I
Troponin I: <0.3 ng/mL (ref <0.30)
Troponin I: <0.3 ng/mL (ref <0.30)

## 2012-12-15 LAB — PROTIME-INR
INR: 3.9 — ABNORMAL HIGH (ref 0.00–1.49)
Prothrombin Time: 35.9 seconds — ABNORMAL HIGH (ref 11.6–15.2)

## 2012-12-15 MED ORDER — WARFARIN - PHARMACIST DOSING INPATIENT: Freq: Every day | Status: DC

## 2012-12-15 MED ORDER — MAGNESIUM SULFATE 40 MG/ML IJ SOLN
2.0000 g | Freq: Once | INTRAMUSCULAR | Status: AC
  Administered 2012-12-15: 2 g via INTRAVENOUS
  Filled 2012-12-15: qty 50

## 2012-12-15 MED ORDER — ALBUTEROL SULFATE HFA 108 (90 BASE) MCG/ACT IN AERS: 2.0000 | INHALATION_SPRAY | Freq: Four times a day (QID) | RESPIRATORY_TRACT | Status: DC | PRN

## 2012-12-15 MED ORDER — PREDNISONE 20 MG PO TABS: ORAL_TABLET | ORAL | Status: DC

## 2012-12-15 MED ORDER — FUROSEMIDE 40 MG PO TABS: 20.0000 mg | ORAL_TABLET | ORAL | Status: DC

## 2012-12-15 NOTE — Discharge Summary (Signed)
Internal Medicine Teaching [facility] Discharge Note  Name: [name] MRN: [id number] DOB: [birth year] 67 y.o.  Date of Admission: 12/14/2012 11:30 AM Date of Discharge: 12/16/2012 Attending Physician: No att. providers found  Discharge Diagnosis: Principal Problem:   Dyspnea Active Problems:   Atrial fibrillation   Cardiomyopathy, alcoholic   Alcohol abuse   Hypertension   Idiopathic cardiomyopathy   Tobacco abuse   Elevated liver enzymes   Long term (current) use of anticoagulants   Malnutrition of moderate degree   Discharge Medications:   Medication List    TAKE these medications       albuterol 108 (90 BASE) MCG/ACT inhaler  Commonly known as:  PROVENTIL HFA;VENTOLIN HFA  Inhale 2 puffs into the lungs every 6 (six) hours as needed for wheezing.     amiodarone 200 MG tablet  Commonly known as:  PACERONE  Take 1 tablet (200 mg total) by mouth daily.     carvedilol 12.5 MG tablet  Commonly known as:  COREG  Take 1 tablet (12.5 mg total) by mouth 2 (two) times daily with a meal.     furosemide 40 MG tablet  Commonly known as:  LASIX  Take 0.5 tablets (20 mg total) by mouth 3 (three) times a week.     lisinopril 5 MG tablet  Commonly known as:  PRINIVIL,ZESTRIL  Take 1 tablet (5 mg total) by mouth daily.     predniSONE 20 MG tablet  Commonly known as:  DELTASONE  Take 40mg  for 2 day then 20 mg next day until finished     warfarin 5 MG tablet  Commonly known as:  COUMADIN  Take 2.5-5 mg by mouth daily. Take 5 mg (a whole tablet) on Tuesday and Wednesday.  On every other day of the week, take 2.5 mg (a half tablet).        Disposition and follow-up:   [name] was discharged from [facility] in Stable condition.  At the hospital follow up visit please address: 1. PFTs 2. Unintentional weight loss workup (including peripheral smear given chronic leukocytosis and amiodarone use) 3. Whether lasix needs to be  continued  Follow-up Appointments:   Future Appointments Provider Department Dept Phone   12/20/2012 3:45 PM Ian Bushman Dierdre Searles, MD [facility] [phone]   12/26/2012 9:30 AM [facility] [place] [facility] [phone]      Consultations:    Procedures Performed:  Dg Chest 2 View  12/14/2012   *RADIOLOGY REPORT*  Clinical Data: Shortness of breath Saturday.  Smoker for 40 years.  CHEST - 2 VIEW  Comparison: 09/11/2012  Findings: Midline trachea.  Borderline cardiomegaly. Mediastinal contours otherwise within normal limits.  No pleural effusion or pneumothorax.  No congestive failure.  Clear lungs.  IMPRESSION: Borderline cardiomegaly, without acute disease.   Original Report Authenticated By: Jeronimo Greaves, M.D.    Admission HPI: 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 Pt states was sitting in chair when felt couldn't catch breath and then walked over to a window to "help get some air" and heard some audible wheezing. All symptoms started on Saturday with some PND, rhinorrhea, and cough w/o productive sputum. Then pt had some worsening SOB while sitting in chair and then some diaphoresis morning of admission and decided  to present to ED. Pt had not had PND, increase in cough from baseline, . Pt continues to be a smoker and around ppl whom smoke daily. Pt denied any fever/chills, nausea/vomiting/diarrhea, abdominal pain, CP, DOE, weight gain, or LE edema. Pt states that he was a former heavy EtOH drinker and only had 1 beer over the holiday weekend. Pt denied any long trips and reports compliance with his "4 drugs."   Hospital Course by problem list: # Dyspnea likely 2/2 URI: Pt not hypoxic and hx doesn't suggest current CHF exacerbation. Pt has actually had a  dramatic weight loss of 20 lbs from prevbious baseline over the past 3 months. CXR no acute cardiopulmonary process and was directly discussed with radiologist that indicated not even classic COPD findings. Pt revised Geneva score 1.0 making PE less likely. Pt improved greatly with duonebs oon 5/28 and had no repeat episodes on AM 5/29 despite no treatments. ACS ruled out with negative trops x3 and unchanged EKG in NSR. Pt elevated BNP may indicate some underlying CHF and pt good diuresis and resolution of symptoms with IV lasix in ED. Pt was given lasix 20mg  qad and should be evaluated as outpt.    #Aflutter s/p ablation: pt currently in NSR. Therapeutic on INR 3.4. Pt on amiodarone and warfarin.   #unintentional weight loss: pt has noticed and documented 20 lb weight loss w/in the last 3 months. No other symptoms or localizing etiology. Concern for malignancy given smoking hx, amiodarone use, previous heavy EtOH abuse. consider AbdUS, low radiation chest CT, HIV, peripheral smear as outpt workup.    Discharge Vitals:  BP 127/77  Pulse 56  Temp(Src) 98.2 F (36.8 C) (Oral)  Resp 18  Ht 5\' 6"  (1.676 m)  Wt 166 lb (75.297 kg)  BMI 26.81 kg/m2  SpO2 96% General: resting in bed, NAD  HEENT: PERRL, EOMI, no scleral icterus  Cardiac: RRR, no rubs, murmurs or gallops  Pulm: slight diffuse wheezes, no crackles or rales, moving normal volumes of air  Abd: soft, nontender, nondistended, BS present  Ext: warm and well perfused, no pedal edema  Neuro: alert and oriented X3, cranial nerves II-XII grossly intact  Discharge Labs:  Results for orders placed during the hospital encounter of 12/14/12 (from the past 24 hour(s))  URINE RAPID DRUG SCREEN (HOSP PERFORMED)     Status: Abnormal   Collection Time    12/15/12 12:39 PM      Result Value Range   Opiates POSITIVE (*) NONE DETECTED   Cocaine NONE DETECTED  NONE DETECTED   Benzodiazepines NONE DETECTED  NONE DETECTED   Amphetamines NONE DETECTED   NONE DETECTED   Tetrahydrocannabinol NONE DETECTED  NONE DETECTED   Barbiturates NONE DETECTED  NONE DETECTED    Signed: Christen Bame 12/16/2012, 12:28 PM   Time Spent on Discharge: 30 min Services Ordered on Discharge: none Equipment Ordered on Discharge: none

## 2012-12-15 NOTE — Progress Notes (Signed)
Subjective: Pt doing well this AM. Slept well no repeat SOB even w/o neb treatments. No CP, DOE, HA.   Objective: Vital signs in last 24 hours: Filed Vitals:   12/14/12 1754 12/14/12 2227 12/15/12 0533 12/15/12 1028  BP: 122/71 111/67 150/76 127/77  Pulse: 55 56 56   Temp: 98.4 F (36.9 C) 98.2 F (36.8 C) 98.2 F (36.8 C)   TempSrc: Oral Oral Oral   Resp: 16 18 18    Height: 5\' 6"  (1.676 m)     Weight: 163 lb 9.6 oz (74.208 kg)  166 lb (75.297 kg)   SpO2: 97% 90% 96%    Weight change:   Intake/Output Summary (Last 24 hours) at 12/15/12 1051 Last data filed at 12/15/12 0900  Gross per 24 hour  Intake    480 ml  Output   1045 ml  Net   -565 ml   General: resting in bed, NAD HEENT: PERRL, EOMI, no scleral icterus Cardiac: RRR, no rubs, murmurs or gallops Pulm: slight diffuse wheezes, no crackles or rales, moving normal volumes of air Abd: soft, nontender, nondistended, BS present Ext: warm and well perfused, no pedal edema Neuro: alert and oriented X3, cranial nerves II-XII grossly intact  Lab Results: Basic Metabolic Panel:  Recent Labs Lab 12/14/12 1200 12/14/12 1922 12/15/12 0141  NA 143  --  137  K 4.0  --  4.0  CL 105  --  97  CO2 29  --  30  GLUCOSE 100*  --  109*  BUN 15  --  21  CREATININE 0.82  --  1.01  CALCIUM 8.9  --  9.1  MG  --  1.7  --    Liver Function Tests:  Recent Labs Lab 12/14/12 1200  AST 42*  ALT 28  ALKPHOS 174*  BILITOT 0.6  PROT 7.0  ALBUMIN 3.2*   CBC:  Recent Labs Lab 12/14/12 1200 12/15/12 0141  WBC 17.8* 17.8*  NEUTROABS 15.3*  --   HGB 17.5* 17.2*  HCT 51.5 49.2  MCV 94.8 93.0  PLT 283 262   Cardiac Enzymes:  Recent Labs Lab 12/14/12 1923 12/15/12 0141 12/15/12 0900  TROPONINI <0.30 <0.30 <0.30   BNP:  Recent Labs Lab 12/14/12 1202  PROBNP 1556.0*   Fasting Lipid Panel:  Recent Labs Lab 12/14/12 1922  CHOL 176  HDL 53  LDLCALC 103*  TRIG 99  CHOLHDL 3.3   Thyroid Function  Tests:  Recent Labs Lab 12/14/12 1921  TSH 0.359   Coagulation:  Recent Labs Lab 12/14/12 1200 12/15/12 0141  LABPROT 36.4* 35.9*  INR 3.97* 3.90*    Micro Results: No results found for this or any previous visit (from the past 240 hour(s)). Studies/Results: Dg Chest 2 View  12/14/2012   *RADIOLOGY REPORT*  Clinical Data: Shortness of breath Saturday.  Smoker for 40 years.  CHEST - 2 VIEW  Comparison: 09/11/2012  Findings: Midline trachea.  Borderline cardiomegaly. Mediastinal contours otherwise within normal limits.  No pleural effusion or pneumothorax.  No congestive failure.  Clear lungs.  IMPRESSION: Borderline cardiomegaly, without acute disease.   Original Report Authenticated By: Jeronimo Greaves, M.D.   Medications: I have reviewed the patient's current medications. Scheduled Meds: . amiodarone  200 mg Oral Daily  . carvedilol  12.5 mg Oral BID WC  . folic acid  1 mg Oral Daily  . furosemide  40 mg Oral Daily  . guaiFENesin  600 mg Oral BID  . lisinopril  5 mg Oral Daily  .  magnesium sulfate 1 - 4 g bolus IVPB  2 g Intravenous Once  . multivitamin with minerals  1 tablet Oral Daily  . pneumococcal 23 valent vaccine  0.5 mL Intramuscular Tomorrow-1000  . predniSONE  60 mg Oral Q breakfast  . sodium chloride  3 mL Intravenous Q12H  . sodium chloride  3 mL Intravenous Q12H  . thiamine  100 mg Oral Daily  . Warfarin - Pharmacist Dosing Inpatient   Does not apply q1800   Continuous Infusions:  PRN Meds:.sodium chloride, albuterol, ipratropium, LORazepam, LORazepam, ondansetron (ZOFRAN) IV, ondansetron, sodium chloride Assessment/Plan: # Dyspnea likely 2/2 URI: Pt not hypoxic and hx doesn't suggest current CHF exacerbation. Pt has actually had a dramatic weight loss of 20 lbs from prevbious baseline over the past 3 months. CXR no acute cardiopulmonary process and was directly discussed with radiologist that indicated not even classic COPD findings. Pt revised Geneva score 1.0  making PE less likely. Pt improved greatly with duonebs oon 5/28 and had no repeat episodes on AM 5/29 despite no treatments. ACS ruled out with negative trops x3 and unchanged EKG in NSR. Pt elevated BNP may indicate some underlying CHF and pt good diuresis and resolution of symptoms with IV lasix in ED.  -lasix 40mg  oral -ambulate in hall with O2  -PFTs as outpt -start lasix 20mg  qad as outpt  #Aflutter s/p ablation: pt currently in NSR. Therapeutic on INR 3.4.  -cont to monitor and coumadin  #unintentional weight loss: pt has noticed and documented 20 lb weight loss w/in the last 3 months. No other symptoms or localizing etiology. Concern for malignancy given smoking hx, amiodarone use, previous heavy EtOH abuse.  -consider AbdUS, low radiation chest CT, HIV, peripheral smear as outpt workup   Dispo: Disposition is deferred at this time, awaiting improvement of current medical problems.  Anticipated discharge in approximately 1-2 day(s).   The patient does have a current PCP Shirlee Latch, Pasty Spillers, MD), therefore will be requiring OPC follow-up after discharge.   The patient does not have transportation limitations that hinder transportation to clinic appointments.  .Services Needed at time of discharge: Y = Yes, Blank = No PT:   OT:   RN:   Equipment:   Other:     LOS: 1 day   Christen Bame 12/15/2012, 10:51 AM Pgr: [phone]

## 2012-12-15 NOTE — Progress Notes (Signed)
Gave pt 3 hemocult card to go home with as instructed by Dr. Burtis Junes.  All d/c instructions explained and given to lpt.  Verbalized understanding.  Amanda Pea, Charity fundraiser.

## 2012-12-15 NOTE — Consult Note (Signed)
ANTICOAGULATION CONSULT NOTE   Pharmacy Consult:  Coumadin Indication: History of A-flutter on chronic anticoagulation for VTE prophylaxis   Allergies: Allergies  Allergen Reactions  . Codeine Rash    Active Problems: Principal Problem:   Dyspnea Active Problems:   Atrial fibrillation   Cardiomyopathy, alcoholic   Alcohol abuse   Hypertension   Idiopathic cardiomyopathy   Tobacco abuse   Elevated liver enzymes   Long term (current) use of anticoagulants   Labs:  Recent Labs  12/14/12 1200 12/15/12 0141  HGB 17.5* 17.2*  HCT 51.5 49.2  PLT 283 262  LABPROT 36.4* 35.9*  INR 3.97* 3.90*  CREATININE 0.82 1.01   Lab Results  Component Value Date   INR 3.90* 12/15/2012   INR 3.97* 12/14/2012   INR 3.00 11/28/2012    Assessment:  67 y.o.male with a history of A-fib/flutter s/p ablation on chronic Coumadin for VTE prophylaxis .  Home dose of Coumadin is 2.5 mg daily except 5 mg on Tue, Wed.  Last dose of Coumadin taken today.  Admission INR 3.97.  Patient is also taking Amiodarone 200 mg daily.  INR still elevated today  Goal of Therapy:   INR 2-3      Plan:   No Coumadin today Daily INR's, CBC. Monitor for bleeding complications   Okey Regal, PharmD 12/15/2012,  9:45 AM

## 2012-12-15 NOTE — H&P (Signed)
Date: 12/15/2012  Patient name: [name]  Medical record number: [id number]  Date of birth: [birth year]   I have seen and evaluated [name] and discussed their care with the Residency Team. Please see Dr Caleen Jobs H&P for full details. Pt had travelled to [facility] over weekend and on the 24th he was eating a hot dog and had sudden onset of dyspnea / couldn't catch breath & diaphoresis. He did nothing and it resolved over 30 minutes. On the day day prior to admission, he had mowed his yard with push mower like he normally does and normally has to stop a couple of times bc he gets tired. On the day of admission, he had another episode of SOB and opened the door to get air without relief. He also had diaphoresis that soaked his shirt. He got relief in ED after nebs were given. He had no CP, palp with any of these episodes. Of note, he has had about 3 days of rhinorrhea and some dyspnea. He has also had documented unintentional wt loss of 25 lb in 3 months.  Physical Exam: Blood pressure 127/77, pulse 56, temperature 98.2 F (36.8 C), temperature source Oral, resp. rate 18, height 5\' 6"  (1.676 m), weight 166 lb (75.297 kg), SpO2 96.00%.   Lab results: Results for orders placed during the hospital encounter of 12/14/12 (from the past 24 hour(s))  TSH     Status: None   Collection Time    12/14/12  7:21 PM      Result Value Range   TSH 0.359  0.350 - 4.500 uIU/mL  LIPID PANEL     Status: Abnormal   Collection Time    12/14/12  7:22 PM      Result Value Range   Cholesterol 176  0 - 200 mg/dL   Triglycerides 99  <409 mg/dL   HDL 53  >81 mg/dL   Total CHOL/HDL Ratio 3.3     VLDL 20  0 - 40 mg/dL   LDL Cholesterol 191 (*) 0 - 99 mg/dL  MAGNESIUM     Status: None   Collection Time    12/14/12  7:22 PM      Result Value Range   Magnesium 1.7  1.5 - 2.5 mg/dL  TROPONIN I     Status: None   Collection Time    12/14/12  7:23 PM      Result Value Range   Troponin I <0.30   <0.30 ng/mL  BASIC METABOLIC PANEL     Status: Abnormal   Collection Time    12/15/12  1:41 AM      Result Value Range   Sodium 137  135 - 145 mEq/L   Potassium 4.0  3.5 - 5.1 mEq/L   Chloride 97  96 - 112 mEq/L   CO2 30  19 - 32 mEq/L   Glucose, Bld 109 (*) 70 - 99 mg/dL   BUN 21  6 - 23 mg/dL   Creatinine, Ser 4.78  0.50 - 1.35 mg/dL   Calcium 9.1  8.4 - 29.5 mg/dL   GFR calc non Af Amer 76 (*) >90 mL/min   GFR calc Af Amer 88 (*) >90 mL/min  CBC     Status: Abnormal   Collection Time    12/15/12  1:41 AM      Result Value Range   WBC 17.8 (*) 4.0 - 10.5 K/uL   RBC 5.29  4.22 - 5.81 MIL/uL   Hemoglobin 17.2 (*) 13.0 -  17.0 g/dL   HCT 13.0  86.5 - 78.4 %   MCV 93.0  78.0 - 100.0 fL   MCH 32.5  26.0 - 34.0 pg   MCHC 35.0  30.0 - 36.0 g/dL   RDW 69.6 (*) 29.5 - 28.4 %   Platelets 262  150 - 400 K/uL  TROPONIN I     Status: None   Collection Time    12/15/12  1:41 AM      Result Value Range   Troponin I <0.30  <0.30 ng/mL  PROTIME-INR     Status: Abnormal   Collection Time    12/15/12  1:41 AM      Result Value Range   Prothrombin Time 35.9 (*) 11.6 - 15.2 seconds   INR 3.90 (*) 0.00 - 1.49  GLUCOSE, CAPILLARY     Status: Abnormal   Collection Time    12/15/12  6:22 AM      Result Value Range   Glucose-Capillary 103 (*) 70 - 99 mg/dL  TROPONIN I     Status: None   Collection Time    12/15/12  9:00 AM      Result Value Range   Troponin I <0.30  <0.30 ng/mL  HIV ANTIBODY (ROUTINE TESTING)     Status: None   Collection Time    12/15/12  9:00 AM      Result Value Range   HIV NON REACTIVE  NON REACTIVE  TECHNOLOGIST SMEAR REVIEW     Status: None   Collection Time    12/15/12  9:00 AM      Result Value Range   Tech Review TOXIC GRANULATION    URINE RAPID DRUG SCREEN (HOSP PERFORMED)     Status: Abnormal   Collection Time    12/15/12 12:39 PM      Result Value Range   Opiates POSITIVE (*) NONE DETECTED   Cocaine NONE DETECTED  NONE DETECTED   Benzodiazepines  NONE DETECTED  NONE DETECTED   Amphetamines NONE DETECTED  NONE DETECTED   Tetrahydrocannabinol NONE DETECTED  NONE DETECTED   Barbiturates NONE DETECTED  NONE DETECTED    Imaging results:  Dg Chest 2 View  12/14/2012   *RADIOLOGY REPORT*  Clinical Data: Shortness of breath Saturday.  Smoker for 40 years.  CHEST - 2 VIEW  Comparison: 09/11/2012  Findings: Midline trachea.  Borderline cardiomegaly. Mediastinal contours otherwise within normal limits.  No pleural effusion or pneumothorax.  No congestive failure.  Clear lungs.  IMPRESSION: Borderline cardiomegaly, without acute disease.   Original Report Authenticated By: Jeronimo Greaves, M.D.    Assessment and Plan: I have seen and evaluated the patient as outlined above. I agree with the formulated Assessment and Plan as detailed in the residents' admission note, with the following changes:   1. Acute resp distress - was rather short lived as resolved in ED and no documented hypoxia. Cause in uncertain. Three Trop I and EKG were negative so unlikely AMI. CXR showed no infiltrate no no PNA. Sxs not c/w (presumed) COPD exac. [name] score 1 so PE unlikely. ECHO was 08/2012 and no valve dz. CXR not c/w with CHF but pt tx with lasix anyway. Pt does have h/o AFib with RVR and was monitored on tele as tachy could have caused sxs. Pts sxs resolved without any specific tx. Ok to D/C. If sxs cont, may need Holter monitor.   2. Chronic systolic and diastolic HF - this is not a new dx and is thought to  be 2/2 tachycardia (A fib with RVR) but has not had ischemic W/U. Has outp cards and can F/U as outpt. On ACE, BB. We are adding lasix three times weekly.   3. Polycythemia - HgB isn't greater than 18.5. He had Epo level checked this yr and was nl range indicating (if hgB was in range of polycythemia range) a secondary cause like cig use.   4. Leukocytosis - Chronic. Would rec checking for BCR:ABL at [facility] visit and referral to heme.  Ryan Spain,  MD 5/29/20142:32 PM

## 2012-12-15 NOTE — Progress Notes (Signed)
SATURATION QUALIFICATIONS: (This note is used to comply with regulatory documentation for home oxygen)  Patient Saturations on Room Air at Rest = 95%  Patient Saturations on Room Air while Ambulating = 94%  Patient Saturations on Room Air while Ambulating = 92%  Please briefly explain why patient needs home oxygen:

## 2012-12-15 NOTE — Progress Notes (Signed)
INITIAL NUTRITION ASSESSMENT  DOCUMENTATION CODES Per approved criteria  -Non-severe (moderate) malnutrition in the context of acute illness or injury   INTERVENTION: 1. Encouraged 3 meals daily and weight monitoring   NUTRITION DIAGNOSIS: Inadequate oral intake related to decreased appetite as evidenced by 1-2 meals daily per pt report.   Goal: PO intake to meet >/=90% estimated nutrition needs  Monitor:  PO intake, weight trends  Reason for Assessment: Malnutrition Screening Tool  67 y.o. male  Admitting Dx: Dyspnea  ASSESSMENT: Pt admitted to hospital with increased SOB, likely URI.  Pt states he has lost "a little" weight. Eating once or twice daily, previously was eating at least 3 times daily. Pt endorses decreased appetite.  Encouraged pt to eat 3 times daily and weight regularly. If has continued weight loss, notify MD.   Pt with 22 lb weight loss in 3 months, 12% body weight. Severe weight loss. Pt meets criteria for moderate malnutrition in the context acute illness 2/2 weight loss of 12% body weight loss in 3 months and meeting <75% estimated nutrition needs for > 7 days.   Pt reports he is likely going home today. Will not add any nutrition supplements at this time.   Height: Ht Readings from Last 1 Encounters:  12/14/12 5\' 6"  (1.676 m)    Weight: Wt Readings from Last 1 Encounters:  12/15/12 166 lb (75.297 kg)    Ideal Body Weight: 142 lbs   % Ideal Body Weight: 116%  Wt Readings from Last 10 Encounters:  12/15/12 166 lb (75.297 kg)  09/15/12 188 lb 6.4 oz (85.458 kg)  09/13/12 182 lb 4.8 oz (82.691 kg)  09/01/12 167 lb 6.4 oz (75.932 kg)  09/30/10 199 lb 3.2 oz (90.357 kg)    Usual Body Weight: 188 lbs   % Usual Body Weight: 88%  BMI:  Body mass index is 26.81 kg/(m^2). Overweight  Estimated Nutritional Needs: Kcal: 1800-2000 Protein: 75-85 gm  Fluid: 1.8-2 L   Skin: intact   Diet Order: Cardiac  EDUCATION NEEDS: -No education needs  identified at this time   Intake/Output Summary (Last 24 hours) at 12/15/12 1021 Last data filed at 12/15/12 0900  Gross per 24 hour  Intake    480 ml  Output   1045 ml  Net   -565 ml    Last BM: PTA    Labs:   Recent Labs Lab 12/14/12 1200 12/14/12 1922 12/15/12 0141  NA 143  --  137  K 4.0  --  4.0  CL 105  --  97  CO2 29  --  30  BUN 15  --  21  CREATININE 0.82  --  1.01  CALCIUM 8.9  --  9.1  MG  --  1.7  --   GLUCOSE 100*  --  109*    CBG (last 3)  No results found for this basename: GLUCAP,  in the last 72 hours  Scheduled Meds: . amiodarone  200 mg Oral Daily  . carvedilol  12.5 mg Oral BID WC  . folic acid  1 mg Oral Daily  . furosemide  40 mg Oral Daily  . guaiFENesin  600 mg Oral BID  . lisinopril  5 mg Oral Daily  . magnesium sulfate 1 - 4 g bolus IVPB  2 g Intravenous Once  . multivitamin with minerals  1 tablet Oral Daily  . pneumococcal 23 valent vaccine  0.5 mL Intramuscular Tomorrow-1000  . predniSONE  60 mg Oral Q breakfast  .  sodium chloride  3 mL Intravenous Q12H  . sodium chloride  3 mL Intravenous Q12H  . thiamine  100 mg Oral Daily  . Warfarin - Pharmacist Dosing Inpatient   Does not apply q1800    Continuous Infusions:   Past Medical History  Diagnosis Date  . Cardiomyopathy   . Substance abuse     alcohol  . Hypertension   . Atrial flutter      status post ablation on 09/11/2010  . CHF (congestive heart failure)     EF 35% with diffuse hypokinesis  . Atrial fibrillation   . Shortness of breath     "all the time lately" (12/14/2012)  . GERD (gastroesophageal reflux disease)   . Hepatitis     "think I had that once; a long time ago; from dirty needles I think" (12/14/2012)    Past Surgical History  Procedure Laterality Date  . Hemorrhoid surgery  1970's  . Cardiac electrophysiology mapping and ablation  08/2010    /notes 09/07/2010 (12/14/2012)    Clarene Duke RD, LDN Pager [phone] After Hours pager [phone]

## 2012-12-15 NOTE — Progress Notes (Signed)
Pt d/c home.  Left the floor via ambulatory.  Decline w/c service.  Amanda Pea, Charity fundraiser.

## 2012-12-20 ENCOUNTER — Encounter: Payer: Self-pay | Admitting: Internal Medicine

## 2012-12-20 ENCOUNTER — Ambulatory Visit (HOSPITAL_BASED_OUTPATIENT_CLINIC_OR_DEPARTMENT_OTHER): Payer: Medicare Other | Admitting: Internal Medicine

## 2012-12-20 VITALS — BP 97/59 | HR 66 | Temp 97.6°F | Ht 62.75 in | Wt 169.1 lb

## 2012-12-20 DIAGNOSIS — Z720 Tobacco use: Secondary | ICD-10-CM

## 2012-12-20 DIAGNOSIS — I428 Other cardiomyopathies: Secondary | ICD-10-CM

## 2012-12-20 DIAGNOSIS — R634 Abnormal weight loss: Secondary | ICD-10-CM

## 2012-12-20 DIAGNOSIS — Z7901 Long term (current) use of anticoagulants: Secondary | ICD-10-CM

## 2012-12-20 DIAGNOSIS — F17200 Nicotine dependence, unspecified, uncomplicated: Secondary | ICD-10-CM

## 2012-12-20 LAB — POC HEMOCCULT BLD/STL (HOME/3-CARD/SCREEN)
Card #3 Fecal Occult Blood, POC: POSITIVE
Fecal Occult Blood, POC: NEGATIVE

## 2012-12-20 NOTE — Assessment & Plan Note (Signed)
Long-term history of tobacco abuse.  -Will need outpatient PFT to rule out COPD

## 2012-12-20 NOTE — Assessment & Plan Note (Addendum)
Patient is noted to have unintentional weight loss of ~ 20 lbs over past few months.  - Will need extensive workup  - I have discussed with him and will bring him back in 2 weeks.  addendum - he was given 3 FOBT cards upon discharge. 2 samples are negative and one sample is positive. Given the history of long term coumadin increase sensitivity of test and decrease the specificity. Will need colonoscopy.

## 2012-12-20 NOTE — Progress Notes (Signed)
Subjective:   Patient ID: [name] male   DOB: 12/25/1945 67 y.o.   MRN: [id number]  HPI: [name] is a 67 y.o. man with PMH of A. Flutter/set up status post ablation on 09/11/2010, CHF with EF of 40-45% and moderately to severely left atrium dilation, hypertension, and history of alcohol abuse, who presents to clinic for hospital followup visit.  Patient was admitted to hospital on 12/14/2012 for evaluation of dyspnea.  He was noted to have upper respirator infection in the setting of long-term tobacco abuse and chronic CHF.  He was treated with oxygen, bronchodilator nebulizer treatment, prednisone, and Lasix.  His symptoms has largely resolved upon discharge and he was discharged home with the instructions to take Lasix 20 mg on every Monday, Wednesday and Friday, and finish his prednisone tapering course.  He states that he feels much better and denies any shortness breath, dyspnea, orthopnea, or PND.  He reports medical compliance to his medical treatment.  However he admits that he's been taking Lasix 40 mg po daily instead of following his D/C instruction.    Past Medical History  Diagnosis Date  . Cardiomyopathy   . Substance abuse     alcohol  . Hypertension   . Atrial flutter      status post ablation on 09/11/2010  . CHF (congestive heart failure)     EF 35% with diffuse hypokinesis  . Atrial fibrillation   . Shortness of breath     "all the time lately" (12/14/2012)  . GERD (gastroesophageal reflux disease)   . Hepatitis     "think I had that once; a long time ago; from dirty needles I think" (12/14/2012)   Current Outpatient Prescriptions  Medication Sig Dispense Refill  . albuterol (PROVENTIL HFA;VENTOLIN HFA) 108 (90 BASE) MCG/ACT inhaler Inhale 2 puffs into the lungs every 6 (six) hours as needed for wheezing.  1 Inhaler  2  . amiodarone (PACERONE) 200 MG tablet Take 1 tablet (200 mg total) by mouth daily.  30 tablet  2  . carvedilol (COREG) 12.5 MG  tablet Take 1 tablet (12.5 mg total) by mouth 2 (two) times daily with a meal.  180 tablet  1  . furosemide (LASIX) 40 MG tablet Take 0.5 tablets (20 mg total) by mouth 3 (three) times a week.  30 tablet  1  . lisinopril (PRINIVIL,ZESTRIL) 5 MG tablet Take 1 tablet (5 mg total) by mouth daily.  30 tablet  2  . predniSONE (DELTASONE) 20 MG tablet Take 40mg  for 2 day then 20 mg next day until finished  15 tablet  0  . warfarin (COUMADIN) 5 MG tablet Take 2.5-5 mg by mouth daily. Take 5 mg (a whole tablet) on Tuesday and Wednesday. On every other day of the week, take 2.5 mg (a half tablet).       No current facility-administered medications for this visit.   Family History  Problem Relation Age of Onset  . Diabetes Mother   . Hypertension Mother   . Cirrhosis Father   . Alcohol abuse Father    History   Social History  . Marital Status: Single    Spouse Name: N/A    Number of Children: N/A  . Years of Education: N/A   Social History Main Topics  . Smoking status: Current Every Day Smoker -- 0.10 packs/day for 40 years    Types: Cigarettes  . Smokeless tobacco: Never Used  . Alcohol Use: 0.0 oz/week     Comment:  12/14/2012 "drink a beer once in a blue moon"  . Drug Use: Yes    Special: "Crack" cocaine, Cocaine, Heroin     Comment: 12/14/2012 "been clean for awhile"  . Sexually Active: Not Currently   Other Topics Concern  . None   Social History Narrative   Moved here from [place]. Lives with common law wife and grandchildren.   Review of Systems: Review of Systems:  Constitutional:  Denies fever, chills, diaphoresis, appetite change and fatigue.   HEENT:  Denies congestion, sore throat, rhinorrhea, sneezing, mouth sores, trouble swallowing, neck pain   Respiratory:  Denies SOB, DOE, cough, and wheezing.   Cardiovascular:  Denies palpitations and leg swelling.   Gastrointestinal:  Denies nausea, vomiting, abdominal pain, diarrhea, constipation, blood in stool and abdominal  distention.   Genitourinary:  Denies dysuria, urgency, frequency, hematuria, flank pain and difficulty urinating.   Musculoskeletal:  Denies myalgias, back pain, joint swelling, arthralgias and gait problem.   Skin:  Denies pallor, rash and wound.   Neurological:  Denies dizziness, seizures, syncope, weakness, light-headedness, numbness and headaches.    .    Objective:  Physical Exam: Filed Vitals:   12/20/12 1605  BP: 97/59  Pulse: 66  Temp: 97.6 F (36.4 C)  TempSrc: Oral  Height: 5' 2.75" (1.594 m)  Weight: 169 lb 1.6 oz (76.703 kg)  SpO2: 94%   Constitutional: Vital signs reviewed.  Patient is a well-developed and well-nourished  in no acute distress and cooperative with exam. Alert and oriented x3.  Head: Normocephalic and atraumatic Ear: TM normal bilaterally Nose: No erythema or drainage noted.  Turbinates normal Mouth: no erythema or exudates, MMM Eyes: PERRL, EOMI, conjunctivae normal, No scleral icterus.  Neck: Supple, Trachea midline normal ROM, No JVD, mass, thyromegaly, or carotid bruit present.  Cardiovascular: RRR, S1 normal, S2 normal, no MRG, pulses symmetric and intact bilaterally Pulmonary/Chest: normal respiratory effort, CTAB, no wheezes, rales, or rhonchi Abdominal: Soft. Non-tender, non-distended, bowel sounds are normal, no masses, organomegaly, or guarding present.  GU: no CVA tenderness Musculoskeletal: No joint deformities, erythema, or stiffness, ROM full and no nontender Hematology: no cervical, inginal, or axillary adenopathy.  Neurological: A&O x3, Strength is normal and symmetric bilaterally, cranial nerve II-XII are grossly intact, no focal motor deficit, sensory intact to light touch bilaterally.  Skin: Warm, dry and intact. No rash, cyanosis, or clubbing.  Psychiatric: Normal mood and affect. speech and behavior is normal. Judgment and thought content normal. Cognition and memory are normal.   Assessment & Plan:

## 2012-12-20 NOTE — Assessment & Plan Note (Addendum)
He reports compliance to Coumadin therapy, and has a followup appointment with Dr. Alexandria Lodge on 12/27/11

## 2012-12-20 NOTE — Assessment & Plan Note (Addendum)
Patient has history of cardiomyopathy with EF of 40-45% from echocardiogram in February 2014.  He is on beta blocker, ACE inhibitor, and diuretics Lasix was added to his regimen upon discharge in May 2014.  -The etiology of his cardiomyopathy is thought to be related to alcoholism. He was seen by [facility] cardiology for ablation during previous hospitalization. I think that he will benefit from outpatient follow up as well. Will send [facility] cardiology referral.  - I think he is rather at dry side since he was taking Lasix 40 mg po daily instead of 20 3x/week. - I will ask him to stop Lasix this week and resume it on Monday 12/26/12.  He should take 20 mg every Mondays, Wednesdays, and Fridays.  - Check BMP and magnesium today - F/U in 2 weeks

## 2012-12-20 NOTE — Patient Instructions (Addendum)
1. followup in two weeks 2. Stop furosemide for this week. Resume it next Monday. Take 1/2 tab Monday, Wednesday and Friday.

## 2012-12-21 LAB — BASIC METABOLIC PANEL WITH GFR
BUN: 24 mg/dL — ABNORMAL HIGH (ref 6–23)
CO2: 31 mEq/L (ref 19–32)
Calcium: 8.7 mg/dL (ref 8.4–10.5)
Chloride: 101 mEq/L (ref 96–112)
Creat: 1.04 mg/dL (ref 0.50–1.35)

## 2012-12-21 NOTE — Progress Notes (Signed)
PFT sch [facility] 12/28/12 11AM - pt aware. Stanton Kidney Andriel Omalley RN 12/21/12 11AM

## 2012-12-23 NOTE — Progress Notes (Signed)
Case discussed with Dr. Li (at time of visit, soon after the resident saw the patient).  We reviewed the resident's history and exam and pertinent patient test results.  I agree with the assessment, diagnosis, and plan of care documented in the resident's note.  

## 2012-12-26 ENCOUNTER — Ambulatory Visit (INDEPENDENT_AMBULATORY_CARE_PROVIDER_SITE_OTHER): Payer: Medicare Other | Admitting: Pharmacist

## 2012-12-26 DIAGNOSIS — Z7901 Long term (current) use of anticoagulants: Secondary | ICD-10-CM

## 2012-12-26 DIAGNOSIS — I4891 Unspecified atrial fibrillation: Secondary | ICD-10-CM

## 2012-12-26 NOTE — Patient Instructions (Signed)
Patient instructed to take medications as defined in the Anti-coagulation Track section of this encounter.  Patient instructed to OMIT/HOLD tomorrow's dose.  Patient verbalized understanding of these instructions.

## 2012-12-26 NOTE — Progress Notes (Signed)
Anti-Coagulation Progress Note  Nature Vogelsang is a 67 y.o. male who is currently on an anti-coagulation regimen.    RECENT RESULTS: Recent results are below, the most recent result is correlated with a dose of 25 mg. per week: Lab Results  Component Value Date   INR 5.40 12/26/2012   INR 3.90* 12/15/2012   INR 3.97* 12/14/2012    ANTI-COAG DOSE: Anticoagulation Dose Instructions as of 12/26/2012     Glynis Smiles Tue Wed Thu Fri Sat   New Dose 5 mg 2.5 mg 2.5 mg 2.5 mg 2.5 mg 2.5 mg 5 mg       ANTICOAG SUMMARY: Anticoagulation Episode Summary   Current INR goal 2.0-3.0  Next INR check 01/09/2013  INR from last check 5.40! (12/26/2012)  Weekly max dose   Target end date Indefinite  [facility]  Preferred lab   Send INR reminders to    Indications  Atrial fibrillation [427.31] Long term (current) use of anticoagulants [V58.61]        Comments         ANTICOAG TODAY: Anticoagulation Summary as of 12/26/2012   INR goal 2.0-3.0  Selected INR 5.40! (12/26/2012)  Next INR check 01/09/2013  Target end date Indefinite   Indications  Atrial fibrillation [427.31] Long term (current) use of anticoagulants [V58.61]      Anticoagulation Episode Summary   [facility]   Preferred lab    Send INR reminders to    Comments       PATIENT INSTRUCTIONS: Patient Instructions  Patient instructed to take medications as defined in the Anti-coagulation Track section of this encounter.  Patient instructed to OMIT/HOLD tomorrow's dose.  Patient verbalized understanding of these instructions.       FOLLOW-UP Return in 2 weeks (on 01/09/2013) for Follow up INR at 0945h.  Hulen Luster, III Pharm.D., CACP

## 2012-12-28 ENCOUNTER — Encounter (HOSPITAL_COMMUNITY): Payer: Medicare Other

## 2013-01-03 ENCOUNTER — Ambulatory Visit (INDEPENDENT_AMBULATORY_CARE_PROVIDER_SITE_OTHER): Payer: Medicare Other | Admitting: Internal Medicine

## 2013-01-03 ENCOUNTER — Encounter: Payer: Self-pay | Admitting: Internal Medicine

## 2013-01-03 VITALS — BP 116/76 | HR 56 | Temp 97.2°F | Ht 66.0 in | Wt 173.2 lb

## 2013-01-03 DIAGNOSIS — D751 Secondary polycythemia: Secondary | ICD-10-CM

## 2013-01-03 DIAGNOSIS — I10 Essential (primary) hypertension: Secondary | ICD-10-CM

## 2013-01-03 DIAGNOSIS — Z720 Tobacco use: Secondary | ICD-10-CM

## 2013-01-03 DIAGNOSIS — D45 Polycythemia vera: Secondary | ICD-10-CM | POA: Insufficient documentation

## 2013-01-03 DIAGNOSIS — Z7901 Long term (current) use of anticoagulants: Secondary | ICD-10-CM

## 2013-01-03 DIAGNOSIS — I428 Other cardiomyopathies: Secondary | ICD-10-CM

## 2013-01-03 DIAGNOSIS — F17200 Nicotine dependence, unspecified, uncomplicated: Secondary | ICD-10-CM

## 2013-01-03 NOTE — Progress Notes (Signed)
Subjective:   Patient ID: [name] male   DOB: 1946/03/31 67 y.o.   MRN: [id number]  HPI: [name] Darting is a 67 y.o. man with PMH of A. Flutter/set up status post ablation on 09/11/2010, CHF with EF of 40-45% and moderately to severely left atrium dilation, hypertension, and history of alcohol abuse, who presents to clinic for followup visit.  Patient was admitted to hospital on 12/14/2012 for evaluation of ? COPD and CHF.   He states that he is doing well.  Denies any shortness breath, dyspnea, orthopnea, or PND. He reports medical compliance to his medical treatment.   Please see my assessment and plan.  # Polycythemia--referral to hematology.  # Tobacco abuse # anticoagulants #  CHF  Health maintenance 1. colonoscopy that he had it done at [facility], will obtain the medical records. 2. Tetanus--had it in the past.  3. Zostavax--Rx given   Past Medical History  Diagnosis Date  . Cardiomyopathy   . Substance abuse     alcohol  . Hypertension   . Atrial flutter      status post ablation on 09/11/2010  . CHF (congestive heart failure)     EF 35% with diffuse hypokinesis  . Atrial fibrillation   . Shortness of breath     "all the time lately" (12/14/2012)  . GERD (gastroesophageal reflux disease)   . Hepatitis     "think I had that once; a long time ago; from dirty needles I think" (12/14/2012)   Current Outpatient Prescriptions  Medication Sig Dispense Refill  . albuterol (PROVENTIL HFA;VENTOLIN HFA) 108 (90 BASE) MCG/ACT inhaler Inhale 2 puffs into the lungs every 6 (six) hours as needed for wheezing.  1 Inhaler  2  . amiodarone (PACERONE) 200 MG tablet Take 1 tablet (200 mg total) by mouth daily.  30 tablet  2  . carvedilol (COREG) 12.5 MG tablet Take 1 tablet (12.5 mg total) by mouth 2 (two) times daily with a meal.  180 tablet  1  . furosemide (LASIX) 40 MG tablet Take 0.5 tablets (20 mg total) by mouth 3 (three) times a week.  30 tablet  1   . lisinopril (PRINIVIL,ZESTRIL) 5 MG tablet Take 1 tablet (5 mg total) by mouth daily.  30 tablet  2  . warfarin (COUMADIN) 5 MG tablet Take 2.5-5 mg by mouth daily. Take 5 mg (a whole tablet) on Tuesday and Wednesday. On every other day of the week, take 2.5 mg (a half tablet).       No current facility-administered medications for this visit.   Family History  Problem Relation Age of Onset  . Diabetes Mother   . Hypertension Mother   . Cirrhosis Father   . Alcohol abuse Father    History   Social History  . Marital Status: Single    Spouse Name: N/A    Number of Children: N/A  . Years of Education: N/A   Social History Main Topics  . Smoking status: Current Every Day Smoker -- 0.10 packs/day for 40 years    Types: Cigarettes  . Smokeless tobacco: Never Used  . Alcohol Use: 0.0 oz/week     Comment: 12/14/2012 "drink a beer once in a blue moon"  . Drug Use: Yes    Special: "Crack" cocaine, Cocaine, Heroin     Comment: 12/14/2012 "been clean for awhile"  . Sexually Active: Not Currently   Other Topics Concern  . None   Social History Narrative   Moved  here from [place]. Lives with common law wife and grandchildren.   Review of Systems: Review of Systems:  Constitutional:  Denies fever, chills, diaphoresis, appetite change and fatigue.   HEENT:  Denies congestion, sore throat, rhinorrhea, sneezing, mouth sores, trouble swallowing, neck pain   Respiratory:  Denies SOB, DOE, cough, and wheezing.   Cardiovascular:  Denies palpitations and leg swelling.   Gastrointestinal:  Denies nausea, vomiting, abdominal pain, diarrhea, constipation, blood in stool and abdominal distention.   Genitourinary:  Denies dysuria, urgency, frequency, hematuria, flank pain and difficulty urinating.   Musculoskeletal:  Denies myalgias, back pain, joint swelling, arthralgias and gait problem.   Skin:  Denies pallor, rash and wound.   Neurological:  Denies dizziness, seizures, syncope, weakness,  light-headedness, numbness and headaches.    .    Objective:  Physical Exam: Filed Vitals:   01/03/13 0904 01/03/13 0940  BP: 151/79 116/76  Pulse: 62 56  Temp: 97.2 F (36.2 C)   TempSrc: Oral   Height: 5\' 6"  (1.676 m)   Weight: 173 lb 3.2 oz (78.563 kg)   SpO2: 96%    General: alert, well-developed, and cooperative to examination.  Head: normocephalic and atraumatic.  Eyes: vision grossly intact, pupils equal, pupils round, pupils reactive to light, no injection and anicteric.  Mouth: pharynx pink and moist, no erythema, and no exudates.  Neck: supple, full ROM, no thyromegaly, no JVD, and no carotid bruits.  Lungs: normal respiratory effort, no accessory muscle use, normal breath sounds, no crackles, and no wheezes. Heart: normal rate, regular rhythm, no murmur, no gallop, and no rub.  Abdomen: soft, non-tender, normal bowel sounds, no distention, no guarding, no rebound tenderness, no hepatomegaly, and no splenomegaly.  Msk: no joint swelling, no joint warmth, and no redness over joints.  Pulses: 2+ DP/PT pulses bilaterally Extremities: No cyanosis, clubbing, edema Neurologic: alert & oriented X3, cranial nerves II-XII intact, strength normal in all extremities, sensation intact to light touch, and gait normal.  Skin: turgor normal and no rashes.  Psych: Oriented X3, memory intact for recent and remote, normally interactive, good eye contact, not anxious appearing, and not depressed appearing.   Assessment & Plan:

## 2013-01-03 NOTE — Assessment & Plan Note (Signed)
He is on Lasix 20 mg every Mon, Wed, and Friday.  He tolerates well. No weight gain or overt fluid overload.   - continue the current therapy.

## 2013-01-03 NOTE — Assessment & Plan Note (Addendum)
Patient has had chronic polycythemia. His EPO done in Feb was norma at 4.4.  Given his unintentional weight loss of ~ 20 Lbs over past few months and history of Tobacco abuse, I think that we should send him for Hematology referral.

## 2013-01-03 NOTE — Assessment & Plan Note (Signed)
Chronic coumadin therapy as managed by Dr. Alexandria Lodge. His last INR was 5.4 on 12/26/2012.  He denies bleeding or hematoma.  Dr. Alexandria Lodge has adjusted his Coumadin dosage.  He is to followup with Dr. Alexandria Lodge on 01/09/13.   - Continue to follow up with [facility] as scheduled - Call the clinic or go to ED if you have bleeding or hematoma.

## 2013-01-03 NOTE — Progress Notes (Signed)
I discussed patient with resident Dr. Li, and I agree with the plans as outlined in her note. 

## 2013-01-03 NOTE — Assessment & Plan Note (Signed)
  Assessment: Progress toward smoking cessation:  smoking less Barriers to progress toward smoking cessation:  none Comments:   Plan: Instruction/counseling given:  I counseled patient on the dangers of tobacco use, advised patient to stop smoking, and reviewed strategies to maximize success. Educational resources provided:  [employer]) brochure Self management tools provided:    Medications to assist with smoking cessation:  Patient declined  Patient agreed to the following self-care plans for smoking cessation:    Other plans:  Patient cuts back his Cig and reports that he only smokes 1-2 cig daily. He does not think that he will need nicotine patch or similar product. He states that he can quit on his own. Will need follow up during next OV.

## 2013-01-03 NOTE — Patient Instructions (Addendum)
1. Continue to take your current medications 2. Follow up with your PCP in 1-2 months 3. Will send Hematology referral . Please make sure to follow up on this appts

## 2013-01-03 NOTE — Assessment & Plan Note (Signed)
BP Readings from Last 3 Encounters:  01/03/13 116/76  12/20/12 97/59  12/15/12 127/77    Lab Results  Component Value Date   [name] 140 12/20/2012   K 4.2 12/20/2012   CREATININE 1.04 12/20/2012    Assessment: Blood pressure control: controlled Progress toward BP goal:  at goal Comments:   Plan: Medications:  continue current medications Educational resources provided: brochure;handout;video Self management tools provided: home blood pressure logbook Other plans:

## 2013-01-04 ENCOUNTER — Other Ambulatory Visit: Payer: Self-pay | Admitting: *Deleted

## 2013-01-04 ENCOUNTER — Encounter (HOSPITAL_COMMUNITY): Payer: Medicare Other

## 2013-01-04 MED ORDER — WARFARIN SODIUM 5 MG PO TABS: ORAL_TABLET | ORAL | Status: DC

## 2013-01-09 ENCOUNTER — Ambulatory Visit: Payer: Medicare Other

## 2013-01-12 ENCOUNTER — Encounter: Payer: Self-pay | Admitting: *Deleted

## 2013-01-19 ENCOUNTER — Encounter: Payer: Self-pay | Admitting: Internal Medicine

## 2013-01-19 ENCOUNTER — Ambulatory Visit (INDEPENDENT_AMBULATORY_CARE_PROVIDER_SITE_OTHER): Payer: Medicare Other | Admitting: Internal Medicine

## 2013-01-19 VITALS — BP 82/59 | HR 55 | Ht 66.0 in | Wt 171.0 lb

## 2013-01-19 DIAGNOSIS — I4891 Unspecified atrial fibrillation: Secondary | ICD-10-CM

## 2013-01-19 DIAGNOSIS — I5022 Chronic systolic (congestive) heart failure: Secondary | ICD-10-CM

## 2013-01-19 DIAGNOSIS — I5023 Acute on chronic systolic (congestive) heart failure: Secondary | ICD-10-CM | POA: Insufficient documentation

## 2013-01-19 DIAGNOSIS — D45 Polycythemia vera: Secondary | ICD-10-CM

## 2013-01-19 DIAGNOSIS — D751 Secondary polycythemia: Secondary | ICD-10-CM

## 2013-01-19 NOTE — Patient Instructions (Signed)
Your physician recommends that you schedule a follow-up appointment as needed with Dr Taylor  

## 2013-01-19 NOTE — Assessment & Plan Note (Signed)
He is maintaining NSR on amio. Continue current meds.

## 2013-01-19 NOTE — Assessment & Plan Note (Signed)
He appears to be compliant, tolerating his medical therapy and well compensated. He does not have severe enough LV dysfunction to warrant prophylactic ICD implant. I would recommend watchful waiting. He is instructed to maintain a low sodium diet.

## 2013-01-19 NOTE — Progress Notes (Signed)
HPI [name] is referred today by the [facility] medical clinic for evaluation of atrial fibrillation and LV dysfunction. He is a patient who has seen [name] in the past. He has had a h/o non-compliance and a 2D echo in February demonstrated an EF 40%. He has not had significant CHF sysmptoms since then. He notes that his compliance has improved. He denies syncope. No chest pain. No edema. Allergies  Allergen Reactions  . Codeine Rash     Current Outpatient Prescriptions  Medication Sig Dispense Refill  . amiodarone (PACERONE) 200 MG tablet Take 1 tablet (200 mg total) by mouth daily.  30 tablet  2  . carvedilol (COREG) 12.5 MG tablet Take 1 tablet (12.5 mg total) by mouth 2 (two) times daily with a meal.  180 tablet  1  . furosemide (LASIX) 40 MG tablet Take 0.5 tablets (20 mg total) by mouth 3 (three) times a week.  30 tablet  1  . lisinopril (PRINIVIL,ZESTRIL) 5 MG tablet Take 1 tablet (5 mg total) by mouth daily.  30 tablet  2  . warfarin (COUMADIN) 5 MG tablet Most recent dosing as of 01/04/13.  Take as directed per Dr. Alexandria Lodge 5 mg Sunday and 2.5 mg all other days.  20 tablet  2  . albuterol (PROVENTIL HFA;VENTOLIN HFA) 108 (90 BASE) MCG/ACT inhaler Inhale 2 puffs into the lungs every 6 (six) hours as needed for wheezing.  1 Inhaler  2   No current facility-administered medications for this visit.     Past Medical History  Diagnosis Date  . Cardiomyopathy   . Substance abuse     alcohol  . Hypertension   . Atrial flutter      status post ablation on 09/11/2010  . CHF (congestive heart failure)     EF 35% with diffuse hypokinesis  . Atrial fibrillation   . Shortness of breath     "all the time lately" (12/14/2012)  . GERD (gastroesophageal reflux disease)   . Hepatitis     "think I had that once; a long time ago; from dirty needles I think" (12/14/2012)    ROS:   All systems reviewed and negative except as noted in the HPI.   Past Surgical History  Procedure  Laterality Date  . Hemorrhoid surgery  1970's  . Cardiac electrophysiology mapping and ablation  08/2010    /notes 09/07/2010 (12/14/2012)     Family History  Problem Relation Age of Onset  . Diabetes Mother   . Hypertension Mother   . Cirrhosis Father   . Alcohol abuse Father      History   Social History  . Marital Status: Single    Spouse Name: N/A    Number of Children: N/A  . Years of Education: N/A   Occupational History  . Not on file.   Social History Main Topics  . Smoking status: Current Every Day Smoker -- 0.10 packs/day for 40 years    Types: Cigarettes  . Smokeless tobacco: Never Used  . Alcohol Use: 0.0 oz/week     Comment: 12/14/2012 "drink a beer once in a blue moon"  . Drug Use: Yes    Special: "Crack" cocaine, Cocaine, Heroin     Comment: 12/14/2012 "been clean for awhile"  . Sexually Active: Not Currently   Other Topics Concern  . Not on file   Social History Narrative   Moved here from [place]. Lives with common law wife and grandchildren.     BP 82/59  Pulse 55  Ht 5\' 6"  (1.676 m)  Wt 171 lb (77.565 kg)  BMI 27.61 kg/m2  Physical Exam:  diskempt appearing NAD HEENT: Unremarkable Neck:  6 cm JVD, no thyromegally Lungs:  Clear with no wheezes, rales, or rhonchi HEART:  Regular rate rhythm, no murmurs, no rubs, no clicks Abd:  soft, positive bowel sounds, no organomegally, no rebound, no guarding Ext:  2 plus pulses, no edema, no cyanosis, no clubbing Skin:  No rashes no nodules Neuro:  CN II through XII intact, motor grossly intact  EKG - sinus bradycardia  Assess/Plan:

## 2013-02-02 ENCOUNTER — Encounter: Payer: Self-pay | Admitting: Internal Medicine

## 2013-02-20 ENCOUNTER — Ambulatory Visit (INDEPENDENT_AMBULATORY_CARE_PROVIDER_SITE_OTHER): Payer: Medicare Other | Admitting: Pharmacist

## 2013-02-20 DIAGNOSIS — I4891 Unspecified atrial fibrillation: Secondary | ICD-10-CM

## 2013-02-20 DIAGNOSIS — Z7901 Long term (current) use of anticoagulants: Secondary | ICD-10-CM

## 2013-02-20 NOTE — Patient Instructions (Signed)
Patient instructed to take medications as defined in the Anti-coagulation Track section of this encounter.  Patient instructed to take today's dose.  Patient verbalized understanding of these instructions.    

## 2013-02-20 NOTE — Progress Notes (Signed)
Anti-Coagulation Progress Note  [name] is a 66 y.o. male who is currently on an anti-coagulation regimen.    RECENT RESULTS: Recent results are below, the most recent result is correlated with a dose of 22.5 mg. per week: Lab Results  Component Value Date   INR 2.10 02/20/2013   INR 5.40 12/26/2012   INR 3.90* 12/15/2012    ANTI-COAG DOSE: Anticoagulation Dose Instructions as of 02/20/2013     Glynis Smiles Tue Wed Thu Fri Sat   New Dose 2.5 mg 5 mg 2.5 mg 5 mg 2.5 mg 2.5 mg 2.5 mg       ANTICOAG SUMMARY: Anticoagulation Episode Summary   Current INR goal 2.0-3.0  Next INR check 03/13/2013  INR from last check 2.10 (02/20/2013)  Weekly max dose   Target end date Indefinite  [facility]  Preferred lab   Send INR reminders to    Indications  Atrial fibrillation [427.31] Long term (current) use of anticoagulants [V58.61]        Comments         ANTICOAG TODAY: Anticoagulation Summary as of 02/20/2013   INR goal 2.0-3.0  Selected INR 2.10 (02/20/2013)  Next INR check 03/13/2013  Target end date Indefinite   Indications  Atrial fibrillation [427.31] Long term (current) use of anticoagulants [V58.61]      Anticoagulation Episode Summary   [facility]   Preferred lab    Send INR reminders to    Comments       PATIENT INSTRUCTIONS: Patient Instructions  Patient instructed to take medications as defined in the Anti-coagulation Track section of this encounter.  Patient instructed to take today's dose.  Patient verbalized understanding of these instructions.      FOLLOW-UP Return in 3 weeks (on 03/13/2013) for Follow up INR at 4:15PM.  Hulen Luster, III Pharm.D., CACP

## 2013-03-07 ENCOUNTER — Other Ambulatory Visit: Payer: Self-pay | Admitting: *Deleted

## 2013-03-07 MED ORDER — LISINOPRIL 5 MG PO TABS: 5.0000 mg | ORAL_TABLET | Freq: Every day | ORAL | Status: DC

## 2013-03-07 MED ORDER — AMIODARONE HCL 200 MG PO TABS: 200.0000 mg | ORAL_TABLET | Freq: Every day | ORAL | Status: DC

## 2013-03-13 ENCOUNTER — Ambulatory Visit (INDEPENDENT_AMBULATORY_CARE_PROVIDER_SITE_OTHER): Payer: Medicare Other | Admitting: Pharmacist

## 2013-03-13 DIAGNOSIS — Z7901 Long term (current) use of anticoagulants: Secondary | ICD-10-CM

## 2013-03-13 DIAGNOSIS — I4891 Unspecified atrial fibrillation: Secondary | ICD-10-CM

## 2013-03-13 NOTE — Patient Instructions (Signed)
Patient instructed to take medications as defined in the Anti-coagulation Track section of this encounter.  Patient instructed to take today's dose.  Patient verbalized understanding of these instructions.    

## 2013-03-13 NOTE — Progress Notes (Signed)
Anti-Coagulation Progress Note  [name] is a 67 y.o. male who is currently on an anti-coagulation regimen.    RECENT RESULTS: Recent results are below, the most recent result is correlated with a dose of 22.5 mg. per week: Lab Results  Component Value Date   INR 1.80 03/13/2013   INR 2.10 02/20/2013   INR 5.40 12/26/2012    ANTI-COAG DOSE: Anticoagulation Dose Instructions as of 03/13/2013     Glynis Smiles Tue Wed Thu Fri Sat   New Dose 5 mg 2.5 mg 5 mg 2.5 mg 5 mg 2.5 mg 5 mg       ANTICOAG SUMMARY: Anticoagulation Episode Summary   Current INR goal 2.0-3.0  Next INR check 04/03/2013  INR from last check 1.80! (03/13/2013)  Weekly max dose   Target end date Indefinite  [facility] check location [facility]  Preferred lab   Send INR reminders to    Indications  Atrial fibrillation [427.31] Long term (current) use of anticoagulants [V58.61]        Comments         ANTICOAG TODAY: Anticoagulation Summary as of 03/13/2013   INR goal 2.0-3.0  Selected INR 1.80! (03/13/2013)  Next INR check 04/03/2013  Target end date Indefinite   Indications  Atrial fibrillation [427.31] Long term (current) use of anticoagulants [V58.61]      Anticoagulation Episode Summary   [facility] check [facility]   Preferred lab    Send INR reminders to    Comments       PATIENT INSTRUCTIONS: Patient Instructions  Patient instructed to take medications as defined in the Anti-coagulation Track section of this encounter.  Patient instructed to take today's dose.  Patient verbalized understanding of these instructions.       FOLLOW-UP Return in 3 weeks (on 04/03/2013) for Follow up INR at 3:45PM.  Hulen Luster, III Pharm.D., CACP

## 2013-03-14 NOTE — Addendum Note (Signed)
Addended by: Neomia Dear on: 03/14/2013 07:05 PM   Modules accepted: Orders

## 2013-03-20 ENCOUNTER — Observation Stay (HOSPITAL_COMMUNITY)
Admission: EM | Admit: 2013-03-20 | Discharge: 2013-03-21 | Disposition: A | Discharge status: Home / Self Care | Payer: Medicare Other | Attending: Internal Medicine | Admitting: Internal Medicine

## 2013-03-20 ENCOUNTER — Emergency Department (HOSPITAL_COMMUNITY): Payer: Medicare Other

## 2013-03-20 ENCOUNTER — Encounter (HOSPITAL_COMMUNITY): Payer: Self-pay | Admitting: Emergency Medicine

## 2013-03-20 DIAGNOSIS — E872 Acidosis, unspecified: Secondary | ICD-10-CM | POA: Insufficient documentation

## 2013-03-20 DIAGNOSIS — I4891 Unspecified atrial fibrillation: Secondary | ICD-10-CM | POA: Insufficient documentation

## 2013-03-20 DIAGNOSIS — Z720 Tobacco use: Secondary | ICD-10-CM | POA: Diagnosis present

## 2013-03-20 DIAGNOSIS — R0603 Acute respiratory distress: Secondary | ICD-10-CM

## 2013-03-20 DIAGNOSIS — I509 Heart failure, unspecified: Secondary | ICD-10-CM | POA: Insufficient documentation

## 2013-03-20 DIAGNOSIS — Z7901 Long term (current) use of anticoagulants: Secondary | ICD-10-CM | POA: Insufficient documentation

## 2013-03-20 DIAGNOSIS — Z79899 Other long term (current) drug therapy: Secondary | ICD-10-CM | POA: Insufficient documentation

## 2013-03-20 DIAGNOSIS — I10 Essential (primary) hypertension: Secondary | ICD-10-CM

## 2013-03-20 DIAGNOSIS — F17200 Nicotine dependence, unspecified, uncomplicated: Secondary | ICD-10-CM | POA: Insufficient documentation

## 2013-03-20 DIAGNOSIS — R0602 Shortness of breath: Secondary | ICD-10-CM | POA: Insufficient documentation

## 2013-03-20 DIAGNOSIS — K219 Gastro-esophageal reflux disease without esophagitis: Secondary | ICD-10-CM | POA: Insufficient documentation

## 2013-03-20 DIAGNOSIS — R0600 Dyspnea, unspecified: Secondary | ICD-10-CM | POA: Diagnosis present

## 2013-03-20 DIAGNOSIS — I5023 Acute on chronic systolic (congestive) heart failure: Principal | ICD-10-CM | POA: Insufficient documentation

## 2013-03-20 DIAGNOSIS — D45 Polycythemia vera: Secondary | ICD-10-CM | POA: Insufficient documentation

## 2013-03-20 LAB — POCT I-STAT 3, ART BLOOD GAS (G3+)
TCO2: 25 mmol/L (ref 0–100)
pCO2 arterial: 46.1 mmHg — ABNORMAL HIGH (ref 35.0–45.0)
pH, Arterial: 7.321 — ABNORMAL LOW (ref 7.350–7.450)

## 2013-03-20 LAB — CBC WITH DIFFERENTIAL/PLATELET
Basophils Relative: 1 % (ref 0–1)
Eosinophils Absolute: 0.9 10*3/uL — ABNORMAL HIGH (ref 0.0–0.7)
Eosinophils Relative: 5 % (ref 0–5)
MCH: 35.1 pg — ABNORMAL HIGH (ref 26.0–34.0)
MCHC: 35.5 g/dL (ref 30.0–36.0)
Neutrophils Relative %: 73 % (ref 43–77)
Platelets: 360 10*3/uL (ref 150–400)
RDW: 15.2 % (ref 11.5–15.5)

## 2013-03-20 LAB — COMPREHENSIVE METABOLIC PANEL
ALT: 32 U/L (ref 0–53)
Albumin: 3.5 g/dL (ref 3.5–5.2)
Alkaline Phosphatase: 126 U/L — ABNORMAL HIGH (ref 39–117)
Calcium: 9.1 mg/dL (ref 8.4–10.5)
Potassium: 3.6 mEq/L (ref 3.5–5.1)
Sodium: 140 mEq/L (ref 135–145)
Total Protein: 7.8 g/dL (ref 6.0–8.3)

## 2013-03-20 LAB — PROTIME-INR: INR: 3.51 — ABNORMAL HIGH (ref 0.00–1.49)

## 2013-03-20 LAB — TROPONIN I
Troponin I: <0.3 ng/mL (ref <0.30)
Troponin I: <0.3 ng/mL (ref <0.30)

## 2013-03-20 LAB — POCT I-STAT TROPONIN I: Troponin i, poc: 0.01 ng/mL (ref 0.00–0.08)

## 2013-03-20 LAB — CG4 I-STAT (LACTIC ACID): Lactic Acid, Venous: 3.65 mmol/L — ABNORMAL HIGH (ref 0.5–2.2)

## 2013-03-20 LAB — PRO B NATRIURETIC PEPTIDE: Pro B Natriuretic peptide (BNP): 3120 pg/mL — ABNORMAL HIGH (ref 0–125)

## 2013-03-20 MED ORDER — AMIODARONE HCL 200 MG PO TABS
200.0000 mg | ORAL_TABLET | Freq: Every day | ORAL | Status: DC
  Administered 2013-03-20 – 2013-03-21 (×2): 200 mg via ORAL
  Filled 2013-03-20 (×2): qty 1

## 2013-03-20 MED ORDER — ENOXAPARIN SODIUM 40 MG/0.4ML ~~LOC~~ SOLN: 40.0000 mg | SUBCUTANEOUS | Status: DC

## 2013-03-20 MED ORDER — METHYLPREDNISOLONE SODIUM SUCC 125 MG IJ SOLR
125.0000 mg | Freq: Once | INTRAMUSCULAR | Status: AC
  Administered 2013-03-20: 125 mg via INTRAVENOUS
  Filled 2013-03-20: qty 2

## 2013-03-20 MED ORDER — ALBUTEROL SULFATE (5 MG/ML) 0.5% IN NEBU
5.0000 mg | INHALATION_SOLUTION | Freq: Once | RESPIRATORY_TRACT | Status: AC
  Administered 2013-03-20: 5 mg via RESPIRATORY_TRACT
  Filled 2013-03-20: qty 1

## 2013-03-20 MED ORDER — WARFARIN SODIUM 2.5 MG PO TABS: 2.5000 mg | ORAL_TABLET | Freq: Every day | ORAL | Status: DC

## 2013-03-20 MED ORDER — LISINOPRIL 5 MG PO TABS
5.0000 mg | ORAL_TABLET | Freq: Every day | ORAL | Status: DC
  Administered 2013-03-20 – 2013-03-21 (×2): 5 mg via ORAL
  Filled 2013-03-20 (×2): qty 1

## 2013-03-20 MED ORDER — IPRATROPIUM BROMIDE 0.02 % IN SOLN: 0.5000 mg | RESPIRATORY_TRACT | Status: DC | PRN

## 2013-03-20 MED ORDER — FUROSEMIDE 10 MG/ML IJ SOLN
20.0000 mg | Freq: Once | INTRAMUSCULAR | Status: AC
  Administered 2013-03-20: 20 mg via INTRAVENOUS

## 2013-03-20 MED ORDER — ASPIRIN EC 81 MG PO TBEC
81.0000 mg | DELAYED_RELEASE_TABLET | Freq: Every day | ORAL | Status: DC
  Administered 2013-03-20 – 2013-03-21 (×2): 81 mg via ORAL
  Filled 2013-03-20 (×2): qty 1

## 2013-03-20 MED ORDER — CARVEDILOL 12.5 MG PO TABS
12.5000 mg | ORAL_TABLET | Freq: Two times a day (BID) | ORAL | Status: DC
  Administered 2013-03-20 – 2013-03-21 (×2): 12.5 mg via ORAL
  Filled 2013-03-20 (×4): qty 1

## 2013-03-20 MED ORDER — IPRATROPIUM BROMIDE 0.02 % IN SOLN
0.5000 mg | Freq: Once | RESPIRATORY_TRACT | Status: AC
  Administered 2013-03-20: 0.5 mg via RESPIRATORY_TRACT
  Filled 2013-03-20: qty 2.5

## 2013-03-20 MED ORDER — ALBUTEROL SULFATE (5 MG/ML) 0.5% IN NEBU
INHALATION_SOLUTION | RESPIRATORY_TRACT | Status: AC
  Administered 2013-03-20: 5 mg via RESPIRATORY_TRACT
  Filled 2013-03-20: qty 0.5

## 2013-03-20 MED ORDER — ALBUTEROL SULFATE (5 MG/ML) 0.5% IN NEBU: 2.5000 mg | INHALATION_SOLUTION | RESPIRATORY_TRACT | Status: DC | PRN

## 2013-03-20 MED ORDER — FUROSEMIDE 10 MG/ML IJ SOLN
40.0000 mg | Freq: Every day | INTRAMUSCULAR | Status: DC
  Filled 2013-03-20 (×2): qty 4

## 2013-03-20 MED ORDER — WARFARIN - PHARMACIST DOSING INPATIENT: Freq: Every day | Status: DC

## 2013-03-20 MED ORDER — FUROSEMIDE 20 MG PO TABS: 20.0000 mg | ORAL_TABLET | ORAL | Status: DC

## 2013-03-20 MED ORDER — ALBUTEROL SULFATE HFA 108 (90 BASE) MCG/ACT IN AERS: 2.0000 | INHALATION_SPRAY | Freq: Four times a day (QID) | RESPIRATORY_TRACT | Status: DC | PRN

## 2013-03-20 MED ORDER — SODIUM CHLORIDE 0.9 % IJ SOLN
3.0000 mL | Freq: Two times a day (BID) | INTRAMUSCULAR | Status: DC
  Administered 2013-03-20 – 2013-03-21 (×3): 3 mL via INTRAVENOUS

## 2013-03-20 MED ORDER — NITROGLYCERIN 2 % TD OINT
1.0000 [in_us] | TOPICAL_OINTMENT | Freq: Once | TRANSDERMAL | Status: AC
  Administered 2013-03-20: 1 [in_us] via TOPICAL
  Filled 2013-03-20: qty 1

## 2013-03-20 MED ORDER — FUROSEMIDE 10 MG/ML IJ SOLN
40.0000 mg | Freq: Once | INTRAMUSCULAR | Status: AC
Start: 2013-03-20 — End: 2013-03-20
  Administered 2013-03-20: 40 mg via INTRAVENOUS
  Filled 2013-03-20: qty 4

## 2013-03-20 NOTE — H&P (Signed)
Date: 03/20/2013               Patient Name:  [name] MRN: [id number]  DOB: [birth year] Age / Sex: 67 y.o., male   PCP: Annett Gula, MD         Medical Service: Internal Medicine Teaching Service         Attending Physician: Dr. Jonah Blue, DO    First Contact: Dr. Darci Needle Pager: [phone]  Second Contact: Dr. Sherrine Maples Pager: [phone]       After Hours (After 5p/  First Contact Pager: [phone]  weekends / holidays): Second Contact Pager: [phone]   Chief Complaint: SOB  History of Present Illness:  [name] is a 67yo WM with h/o A fib (on warfarin, followed by Dr. Alexandria Lodge), sCHF (last Echo done in Feb 2014 EF 40-45%), HTN, GERD who presents with c/o SOB that began today when he was driving to pick up a friend. Patient reports having quickly worsening SOB with diaphoresis this morning around 7AM, denies CP, lightheadedness, palpitations, abd pain, N/V. Patient reports having similar episodes in the past, but is unsure what he was diagnosed with. Currently, patient feels much better and is no longer SOB and was satting 97% on 5LPM O2 [place].  He denies any previous diagnosis of COPD, though he has about a 50-60 pack year history of smoking. Patient denies diarrhea, urinary complaints, BLE edema. He has not been sick recently. He has been eating and drinking normally and has felt in his usual state of health up until today. He has been seen by cardiology, Dr. Ladona Ridgel.  In the ED, patient was initially managed on bipap, but was weaned off. He received an ABG that showed pH 7.32, pCO2 46.1, pO2 65, bicarb 23.9; CMP significant for Alk phos of 126, AST of 51; CBC showed WBC 17.2, Hb 20.7 (though this is stable from previous) pro BNP 3120; troponin neg x 1; lactic acid 3.65; INR 3.51. EKG showed NSR with no ischemic changes. CXR showed changes c/w mild CHF. Patient received duoneb x 1, solumedrol 125mg  x 1, and lasix 40mg  IV x 1.  Meds: Medications Prior to Admission  Medication Sig  Dispense Refill  . amiodarone (PACERONE) 200 MG tablet Take 1 tablet (200 mg total) by mouth daily.  30 tablet  5  . carvedilol (COREG) 12.5 MG tablet Take 1 tablet (12.5 mg total) by mouth 2 (two) times daily with a meal.  180 tablet  1  . furosemide (LASIX) 40 MG tablet Take 0.5 tablets (20 mg total) by mouth 3 (three) times a week.  30 tablet  1  . lisinopril (PRINIVIL,ZESTRIL) 5 MG tablet Take 1 tablet (5 mg total) by mouth daily.  30 tablet  5  . warfarin (COUMADIN) 5 MG tablet Take 2.5-5 mg by mouth daily. 2.5 mg Monday Wednesday and Friday and 5 mg all other days      . albuterol (PROVENTIL HFA;VENTOLIN HFA) 108 (90 BASE) MCG/ACT inhaler Inhale 2 puffs into the lungs every 6 (six) hours as needed for wheezing.  1 Inhaler  2    Allergies: Allergies as of 03/20/2013 - Review Complete 03/20/2013  Allergen Reaction Noted  . Codeine Rash 09/30/2010   Past Medical History  Diagnosis Date  . Cardiomyopathy   . Substance abuse     alcohol  . Hypertension   . Atrial flutter      status post ablation on 09/11/2010  . CHF (congestive heart failure)     EF  35% with diffuse hypokinesis  . Atrial fibrillation   . Shortness of breath     "all the time lately" (12/14/2012)  . GERD (gastroesophageal reflux disease)   . Hepatitis     "think I had that once; a long time ago; from dirty needles I think" (12/14/2012)   Past Surgical History  Procedure Laterality Date  . Hemorrhoid surgery  1970's  . Cardiac electrophysiology mapping and ablation  08/2010    /notes 09/07/2010 (12/14/2012)   Family History  Problem Relation Age of Onset  . Diabetes Mother   . Hypertension Mother   . Cirrhosis Father   . Alcohol abuse Father    History   Social History  . Marital Status: Single    Spouse Name: N/A    Number of Children: N/A  . Years of Education: N/A   Occupational History  . Not on file.   Social History Main Topics  . Smoking status: Current Every Day Smoker -- 0.10 packs/day for  40 years    Types: Cigarettes  . Smokeless tobacco: Never Used     Comment: patient smoked 1-2ppd x 50 years, now smoking approx 4-5 cigs per day x 1 year  . Alcohol Use: 0.0 oz/week     Comment: drinks a beer once in a while for the past year, but has h/o heavy alcohol use for many years before this  . Drug Use: Yes    Special: "Crack" cocaine, Cocaine, Heroin     Comment: 03/20/2013 "been clean for awhile"; endorses using heroin "all my life" up until about a year ago  . Sexual Activity: Not Currently   Other Topics Concern  . Not on file   Social History Narrative   Moved here from [place]. Lives with common law wife and grandchildren. 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 rate 17, height 5\' 6"  (1.676 m), weight 71 kg (156 lb 8.4 oz), SpO2 92.00%. General: alert, cooperative, and in no apparent distress; on 5LPM O2 via [place] HEENT: pupils equal round and reactive to light, vision grossly intact, oropharynx clear and non-erythematous, but dry mucous membranes Neck: supple, no lymphadenopathy, JVD Lungs: clear to ascultation bilaterally, normal work of respiration, no wheezes, rales, ronchi Heart: regular rate and rhythm, no murmurs, gallops, or rubs Abdomen: soft, non-tender, non-distended, normal bowel sounds Extremities: warm extremities, no BLE edema Neurologic: alert & oriented X3, cranial nerves II-XII grossly intact, strength grossly intact, sensation intact to light touch  Lab results: Basic Metabolic  Panel:  Recent Labs  03/20/13 0815  NA 140  K 3.6  CL 101  CO2 24  GLUCOSE 236*  BUN 10  CREATININE 0.89  CALCIUM 9.1   Liver Function Tests:  Recent Labs  03/20/13 0815  AST 51*  ALT 32  ALKPHOS 126*  BILITOT 0.4  PROT 7.8  ALBUMIN 3.5   CBC:  Recent Labs  03/20/13 0815  WBC 17.2*  NEUTROABS 12.6*  HGB 20.7*  HCT 58.3*  MCV 99.0  PLT 360   Cardiac Enzymes: Troponin .01  BNP:  Recent Labs  03/20/13 0815  PROBNP 3120.0*   Coagulation:  Recent Labs  03/20/13 0815  LABPROT 33.9*  INR 3.51*   Lactic acid-  3.65  Urine Drug Screen: Drugs of Abuse     Component Value Date/Time   LABOPIA POSITIVE* 12/15/2012 1239   COCAINSCRNUR NONE DETECTED 12/15/2012 1239   LABBENZ NONE DETECTED 12/15/2012 1239   AMPHETMU NONE DETECTED 12/15/2012 1239   THCU NONE DETECTED 12/15/2012 1239   LABBARB NONE DETECTED 12/15/2012 1239   Imaging results:  Dg Chest Port 1 View  03/20/2013   *RADIOLOGY REPORT*  Clinical Data: Shortness of breath  PORTABLE CHEST - 1 VIEW  Comparison: 12/14/2012  Findings: The heart is at the upper limits of normal in size. Vascular congestion is noted with mild interstitial changes.  No focal confluent infiltrate is seen.  IMPRESSION: Changes consistent with mild CHF.   Original Report Authenticated By: Alcide Clever, M.D.    Other results: EKG: NSR, normal intervals and axis, nonspecific T wave changes.   Assessment & Plan by Problem: This is a 66yo WM with PMH of HTN, A fib, systolic CHF who presents with SOB and diaphoresis with elevated pro BNP, which likely represents acute on chronic systolic CHF.  # Acute on Chronic systolic CHF: Patient has h/o sCHF and presents with dyspnea and diaphoresis with an elevated BNP. This is most likely an acute CHF exacerbation especially since his SOB almost completely resolved after receiving lasix in the ED. His last Echo done in Feb 2014 showed an EF 40-45%. He is managed on carvedilol, lasix, lisinopril  at home- patient reports medication compliance, but when asked is unable to report exactly what he is taking. This episode of SOB may also be explained by COPD given patient's extensive smoking history. However, he has not been previously diagnosed with COPD and did not have wheezing on exam. His SOB is unlikely 2/2 pneumonia given his CXR did not reveal any consolidation or other signs of pneumonia and that his SOB has now resolved after lasix and duoneb therapy. Additionally, his WBC is elevated at 17.2, but this appears stable. ACS cannot be ruled out at this time, although he did not have chest pain and his EKG did not show signs of ischemia. PE is unlikely given his Wells score is 0.0 and his geneva score is 0.0.  -s/p lasix 40mg  x 1 in ED; will give lasix 20mg  IV once today and start lasix 40mg  PO daily tomorrow -BiPAP prn -daily weights -strict I/Os -duoneb prn -tele -continuous pulse ox -NPO- precaution in case he decompensates again- will give diet if stable tomorrow -cycle troponin  # Lactic acidosis: Likely metabolic 2/2 increased WOB. Patient had AG 15 on admission, which is likely 2/2 increased lactic acid. Given patient had significant respiratory distress prior to arrival in the ED, his increased WOB is likely the culprit for an increased lactic acid. I believe this will resolve now that patient is breathing comfortably. -recheck lactic acid in AM  # Polycythemia and leukocytosis- This appears to be chronic and per chart review, in June 2014 Dr. Dierdre Searles had referred him to heme/onc for further work up, but patient has not been seen by their service yet.  - Hb 20.7 and WBC 17.2 on admission - blood smear - consider hematology consult  # HTN: BP stable. Patient is on coreg 12.5mg  bid, lasix 40mg  three times weekly, lisinopril 5mg  daily at home.  -continue home medications   # A fib: Patient is on warfarin and amiodarone and is  managed by Dr. Alexandria Lodge. His INR was 3.5 on  admission. -holding coumadin for supratherapeutic INR- warfarin dosing per pharmacy -continue amiodarone   # VTE: holding coumadin since INR is supratherapeutic  # Diet: NPO  Code status: full  Dispo: Disposition is deferred at this time, awaiting improvement of current medical problems. Anticipated discharge in approximately 1-2 day(s).   The patient does have a current PCP Annett Gula, MD) and does need an [facility] hospital follow-up appointment after discharge.  The patient does not have transportation limitations that hinder transportation to clinic appointments.  Signed: Windell Hummingbird, MD 03/20/2013, 2:07 PM

## 2013-03-20 NOTE — ED Notes (Addendum)
Results of lactic acid shown to primary nurse Donnamae Jude

## 2013-03-20 NOTE — ED Notes (Signed)
Received pt via EMS with c/o found in vehicle by bystander short of breath. Upon arrival to ED per EMS pt ripped out his IV and took off there bipap. Pt talking in one word sentences. Pt diaphoretic, restless. Lung sounds diminished. Dr Jeraldine Loots at bedside.

## 2013-03-20 NOTE — ED Notes (Signed)
Talked with RN Hayley- need clarification on admit order.

## 2013-03-20 NOTE — ED Notes (Signed)
Pt on continuous pulse oximetry, blood pressure cuff and RT maintaining airway with bi-pap

## 2013-03-20 NOTE — ED Provider Notes (Signed)
CSN: [id number]     Arrival date & time 03/20/13  0746 History   First MD Initiated Contact with Patient 03/20/13 0750     Chief Complaint  Patient presents with  . Shortness of Breath   HPI  Patient presents in extremis via EMS.  Patient only nods to answer questions, brief single word responses.  He denies pain, endorses dyspnea.  Symptoms seem to have begun yesterday.  Since onset symptoms have progressed rapidly, with significant dyspnea.  Per report the patient was found in an automobile dyspneic, diaphoretic, tachycardic, tachypneic.  Initial vital signs also demonstrated hypoxia with hypertension. Initial vital signs have blood pressure greater than 200 systolic, pulse oximetry 80% on room air, respiratory rate 40. En route the patient began to receive therapy, but removed his respiratory mask, pulled out his IV. The remainder of the history of present illness is limited secondary to clinical condition. Level V caveat  Past Medical History  Diagnosis Date  . Cardiomyopathy   . Substance abuse     alcohol  . Hypertension   . Atrial flutter      status post ablation on 09/11/2010  . CHF (congestive heart failure)     EF 35% with diffuse hypokinesis  . Atrial fibrillation   . Shortness of breath     "all the time lately" (12/14/2012)  . GERD (gastroesophageal reflux disease)   . Hepatitis     "think I had that once; a long time ago; from dirty needles I think" (12/14/2012)   Past Surgical History  Procedure Laterality Date  . Hemorrhoid surgery  1970's  . Cardiac electrophysiology mapping and ablation  08/2010    /notes 09/07/2010 (12/14/2012)   Family History  Problem Relation Age of Onset  . Diabetes Mother   . Hypertension Mother   . Cirrhosis Father   . Alcohol abuse Father    History  Substance Use Topics  . Smoking status: Current Every Day Smoker -- 0.10 packs/day for 40 years    Types: Cigarettes  . Smokeless tobacco: Never Used  . Alcohol Use: 0.0 oz/week   Comment: 12/14/2012 "drink a beer once in a blue moon"    Review of Systems  Unable to perform ROS: Acuity of condition    Allergies  Codeine  Home Medications   Current Outpatient Rx  Name  Route  Sig  Dispense  Refill  . albuterol (PROVENTIL HFA;VENTOLIN HFA) 108 (90 BASE) MCG/ACT inhaler   Inhalation   Inhale 2 puffs into the lungs every 6 (six) hours as needed for wheezing.   1 Inhaler   2   . amiodarone (PACERONE) 200 MG tablet   Oral   Take 1 tablet (200 mg total) by mouth daily.   30 tablet   5   . carvedilol (COREG) 12.5 MG tablet   Oral   Take 1 tablet (12.5 mg total) by mouth 2 (two) times daily with a meal.   180 tablet   1   . furosemide (LASIX) 40 MG tablet   Oral   Take 0.5 tablets (20 mg total) by mouth 3 (three) times a week.   30 tablet   1   . lisinopril (PRINIVIL,ZESTRIL) 5 MG tablet   Oral   Take 1 tablet (5 mg total) by mouth daily.   30 tablet   5   . warfarin (COUMADIN) 5 MG tablet      Most recent dosing as of 01/04/13.  Take as directed per Dr. Alexandria Lodge 5 mg  Sunday and 2.5 mg all other days.   20 tablet   2    BP 154/88  Pulse 85  Temp(Src) 97.9 F (36.6 C) (Oral)  Resp 30  SpO2 95% Physical Exam  Nursing note and vitals reviewed. Constitutional: He is oriented to person, place, and time.  Diaphoretic, anxious, obese male clearly in respiratory distress  HENT:  Head: Normocephalic and atraumatic.  Eyes: Conjunctivae are normal. Right eye exhibits no discharge. Left eye exhibits no discharge.  Neck: No tracheal deviation present.  Cardiovascular: Regular rhythm.  Tachycardia present.   Pulmonary/Chest: Accessory muscle usage and stridor present. Tachypnea noted. He is in respiratory distress. He has decreased breath sounds. He has wheezes.  Abdominal: Soft. Normal appearance. There is no tenderness.  Musculoskeletal: He exhibits no edema.  Neurological: He is alert and oriented to person, place, and time. No cranial nerve  deficit. He exhibits normal muscle tone. Coordination normal.  Skin: He is diaphoretic.  Psychiatric: His mood appears anxious.    ED Course  Procedures (including critical care time) Labs Review Labs Reviewed  CBC WITH DIFFERENTIAL  COMPREHENSIVE METABOLIC PANEL  PRO B NATRIURETIC PEPTIDE  BLOOD GAS, ARTERIAL  PROTIME-INR   Imaging Review No results found. During the patient's initial evaluation I discussed the case with the EMS providers. After transport to our gurney, the patient had EKG performed.  His initial EKG had significant artifact, but had rate 114, sinus tachycardia, no gross ST segment changes. This is abnormal  Initial cardiac monitoring was 121, regular, abnormal Initial pulse ox initially was 86% with nasal cannula, this is abnormal   After the initial evaluation the patient started to receive BiPAP, had cutaneous nitroglycerin placed.  Repeat blood pressure, vitals following initiation of therapy were notable for decreased heart rate, less than 100, blood pressure 157 systolic, pulse oximetry 96% with BiPAP.  Diaphoresis notably decreased.  EKG #2 has sinus rhythm, rate 89, evidence of left ventricular hypertrophy, no significant ST changes, this has changed, remains abnormal  8:40 AM blood gas demonstrates acidosis, 7.3 On repeat exam the patient appears more comfortable, heart rate 80, respiratory rate 30  9:36 AM Patient presenting urine.  Transitioning from BiPAP to less invasive form of oxygenation. Results notable for elevated BNP consistent with heart failure, acute decompensation. MDM  No diagnosis found. This patient with multiple medical problems, including CHF presents in extremis after being found dyspneic, diaphoretic, tachycardic, hypoxic and tachypneic. On initial exam the patient is unable to speak, it is extremely uncomfortable appearing, with respiratory distress.  Initial resuscitation included a noninvasive supplemental oxygen,  nitroglycerin paste, Lasix.  Following a substantial.  In the emergency department, the patient's respiratory rate and status improved markedly.  The patient's labs demonstrate lactic acidosis, elevated BNP, consistent with likely acute decompensated heart failure. With his notable symptoms, he required admission for further evaluation and management, though his course improved substantially in the emergency department, he remains critically ill.  CRITICAL CARE Performed by: Gerhard Munch Total critical care time: 40 Critical care time was exclusive of separately billable procedures and treating other patients. Critical care was necessary to treat or prevent imminent or life-threatening deterioration. Critical care was time spent personally by me on the following activities: development of treatment plan with patient and/or surrogate as well as nursing, discussions with consultants, evaluation of patient's response to treatment, examination of patient, obtaining history from patient or surrogate, ordering and performing treatments and interventions, ordering and review of laboratory studies, ordering and review of  radiographic studies, pulse oximetry and re-evaluation of patient's condition.    Gerhard Munch, MD 03/20/13 [phone]

## 2013-03-20 NOTE — ED Notes (Signed)
Pt. tolerating very well off Bipap @ this time on 2lpm n/c, able to talk full sentences, moved to step down bed from acute bed, RT to monitor.

## 2013-03-20 NOTE — Progress Notes (Signed)
ANTICOAGULATION CONSULT NOTE - Initial Consult  Pharmacy Consult for Warfarin Indication: Afib  Allergies  Allergen Reactions  . Codeine Rash   Patient Measurements: Height: 5\' 6"  (167.6 cm) Weight: 156 lb 8.4 oz (71 kg) IBW/kg (Calculated) : 63.8  Vital Signs: Temp: 98.3 F (36.8 C) (09/01 1320) Temp src: Oral (09/01 1320) BP: 119/63 mmHg (09/01 1320) Pulse Rate: 71 (09/01 1320)  Labs:  Recent Labs  03/20/13 0815  HGB 20.7*  HCT 58.3*  PLT 360  LABPROT 33.9*  INR 3.51*  CREATININE 0.89   Estimated Creatinine Clearance: 74.7 ml/min (by C-G formula based on Cr of 0.89).  Medical History: Past Medical History  Diagnosis Date  . Cardiomyopathy   . Substance abuse     alcohol  . Hypertension   . Atrial flutter      status post ablation on 09/11/2010  . CHF (congestive heart failure)     EF 35% with diffuse hypokinesis  . Atrial fibrillation   . Shortness of breath     "all the time lately" (12/14/2012)  . GERD (gastroesophageal reflux disease)   . Hepatitis     "think I had that once; a long time ago; from dirty needles I think" (12/14/2012)   Medications:  Prescriptions prior to admission  Medication Sig Dispense Refill  . amiodarone (PACERONE) 200 MG tablet Take 1 tablet (200 mg total) by mouth daily.  30 tablet  5  . carvedilol (COREG) 12.5 MG tablet Take 1 tablet (12.5 mg total) by mouth 2 (two) times daily with a meal.  180 tablet  1  . furosemide (LASIX) 40 MG tablet Take 0.5 tablets (20 mg total) by mouth 3 (three) times a week.  30 tablet  1  . lisinopril (PRINIVIL,ZESTRIL) 5 MG tablet Take 1 tablet (5 mg total) by mouth daily.  30 tablet  5  . warfarin (COUMADIN) 5 MG tablet Take 2.5-5 mg by mouth daily. 2.5 mg Monday Wednesday and Friday and 5 mg all other days      . albuterol (PROVENTIL HFA;VENTOLIN HFA) 108 (90 BASE) MCG/ACT inhaler Inhale 2 puffs into the lungs every 6 (six) hours as needed for wheezing.  1 Inhaler  2   Assessment: 67 yo male  admitted after being found in a vehicle with shortness of breath.  He was diaphoretic, tachycardic, tachypneic and dyspneic upon arrival to the ED.  He is on chronic anticoagulation for a history of Afib.  His CBC reveals an elevated H/H, platelets of 360K and his INR today is 33.9/3.51.  We have been asked to follow his Warfarin therapy while he is hospitalized.  He is followed in the coumadin clinic with current recommendations of therapy noted below.  His last INR was on 8/25 and at that time it was 1.8.  He is without noted bleeding complications.  Anticoagulation Monitoring 03/13/2013  INR goal 2.0-3.0  Assoc. INR Date 03/13/2013  Associated INR 1.80  Pt. deviation No  Current weekly dose 22.5 mg  Sunday dose 5 mg  Monday dose 2.5 mg  Tuesday dose 5 mg  Wednesday dose 2.5 mg  Thursday dose 5 mg  Friday dose 2.5 mg  Saturday dose 5 mg  Weekly dose 27.5 mg  Return date 04/03/2013   Drug/Drug Interactions: Amidoarone/Warfarin - amiodarone may inhibit hepatic metabolism and increase the anticoagulant effect of warfarin.   Education: I spoke with the patient and he demonstrates understanding of his Warfarin therapy.  I discussed current findings of his elevated INR and  that his Warfarin would be held today.  He understands the plan.  Goal of Therapy:  INR 2-3 Monitor platelets by anticoagulation protocol: Yes   Plan:  - Hold Warfarin today - Daily PT/INR - Monitor for bleeding complications. - F/U for ongoing educational needs  Nadara Mustard, PharmD., MS Clinical Pharmacist Pager:  [phone] Thank you for allowing pharmacy to be part of this patients care team.  Chinita Greenland 03/20/2013,2:37 PM

## 2013-03-20 NOTE — ED Notes (Signed)
Placed pt. on 6lpm n/c upon arrival then transitioned to BiPap of 12/6-BUR-12/50% pt. Rate of 30, VT-506/mv-15/leak-16, was on CPAP by [facility].

## 2013-03-21 ENCOUNTER — Other Ambulatory Visit: Payer: Self-pay | Admitting: *Deleted

## 2013-03-21 DIAGNOSIS — I4891 Unspecified atrial fibrillation: Secondary | ICD-10-CM

## 2013-03-21 DIAGNOSIS — I5023 Acute on chronic systolic (congestive) heart failure: Secondary | ICD-10-CM

## 2013-03-21 DIAGNOSIS — Z7901 Long term (current) use of anticoagulants: Secondary | ICD-10-CM

## 2013-03-21 DIAGNOSIS — R0609 Other forms of dyspnea: Secondary | ICD-10-CM

## 2013-03-21 DIAGNOSIS — I10 Essential (primary) hypertension: Secondary | ICD-10-CM

## 2013-03-21 LAB — CBC
HCT: 54 % — ABNORMAL HIGH (ref 39.0–52.0)
MCH: 34.9 pg — ABNORMAL HIGH (ref 26.0–34.0)
MCV: 96.1 fL (ref 78.0–100.0)
Platelets: 279 10*3/uL (ref 150–400)
RBC: 5.62 MIL/uL (ref 4.22–5.81)
WBC: 20.6 10*3/uL — ABNORMAL HIGH (ref 4.0–10.5)

## 2013-03-21 LAB — BASIC METABOLIC PANEL
CO2: 28 mEq/L (ref 19–32)
Calcium: 8.8 mg/dL (ref 8.4–10.5)
Chloride: 97 mEq/L (ref 96–112)
Glucose, Bld: 135 mg/dL — ABNORMAL HIGH (ref 70–99)
Sodium: 138 mEq/L (ref 135–145)

## 2013-03-21 LAB — PROTIME-INR: INR: 2.38 — ABNORMAL HIGH (ref 0.00–1.49)

## 2013-03-21 MED ORDER — WARFARIN SODIUM 5 MG PO TABS
5.0000 mg | ORAL_TABLET | Freq: Once | ORAL | Status: DC
  Filled 2013-03-21: qty 1

## 2013-03-21 MED ORDER — FUROSEMIDE 40 MG PO TABS: 20.0000 mg | ORAL_TABLET | Freq: Every day | ORAL | Status: DC

## 2013-03-21 NOTE — Discharge Summary (Signed)
Name: [name] MRN: [id number] DOB: [birth year] 67 y.o. PCP: Annett Gula, MD  Date of Admission: 03/20/2013  7:47 AM Date of Discharge: 03/21/2013 Attending Physician: Dr. Kem Kays  Discharge Diagnosis: Principal Problem:   Acute on chronic systolic heart failure Active Problems:   Atrial fibrillation- on coumadin and amiodarone    Hypertension   Tobacco abuse    Discharge Medications:   Medication List         albuterol 108 (90 BASE) MCG/ACT inhaler  Commonly known as:  PROVENTIL HFA;VENTOLIN HFA  Inhale 2 puffs into the lungs every 6 (six) hours as needed for wheezing.     amiodarone 200 MG tablet  Commonly known as:  PACERONE  Take 1 tablet (200 mg total) by mouth daily.     carvedilol 12.5 MG tablet  Commonly known as:  COREG  Take 1 tablet (12.5 mg total) by mouth 2 (two) times daily with a meal.     furosemide 40 MG tablet  Commonly known as:  LASIX  Take 0.5 tablets (20 mg total) by mouth daily.     lisinopril 5 MG tablet  Commonly known as:  PRINIVIL,ZESTRIL  Take 1 tablet (5 mg total) by mouth daily.     warfarin 5 MG tablet  Commonly known as:  COUMADIN  Take 2.5-5 mg by mouth daily. 2.5 mg Monday Wednesday and Friday and 5 mg all other days        Disposition and follow-up:   [name] was discharged from [facility] in Stable condition.  At the hospital follow up visit please address:  1.  Polycythemia and leukocytosis- please make sure patient will follow up with heme/onc  2.  Wheezing on exam- patient has 50-60 pack year hx of smoking, would benefit from eval of PFTs as I suspect he has an element of COPD 3.  A fib- he is followed by Dr. Alexandria Lodge; please make sure he is planning to do so given he was supratherapeutic at time of discharge  4. CHF- Patient had been on lasix 20mg  three times a week at home, but on discharge we increased this dose to 20mg  daily until he follows up in [facility]; please re-eval lasix dose at this  time  2.  Labs / imaging needed at time of follow-up: INR checked (was supratherapeutic at time of discharge- 2.38)  3.  Pending labs/ test needing follow-up: none  Follow-up Appointments:     Follow-up Information   Follow up with Myra Rude, MD. Schedule an appointment as soon as possible for a visit in 1 day.   Specialty:  Hematology and Oncology   Contact information:   [address] [place] [address] [phone]       Follow up with Lorretta Harp, MD On 03/24/2013. (1:15 pm)    Specialty:  Internal Medicine   Contact information:   [address] [place] [address] [phone]       Follow up with Gar Ponto, [facility] On 03/27/2013. (4:00pm)    Specialty:  Pharmacist   Contact information:   - - - - [place] [address] [phone]       Discharge Instructions: Discharge Orders   Future Appointments Provider Department Dept Phone   03/24/2013 1:15 PM Lorretta Harp, MD [facility] [phone]   03/27/2013 4:00 PM [facility] [phone]   Future Orders Complete By Expires   (HEART FAILURE PATIENTS) Call MD:  Anytime you have any of the following symptoms: 1) 3 pound weight gain in 24 hours or 5 pounds in 1 week 2) shortness of breath, with or without a dry hacking cough 3) swelling in the hands, feet or stomach 4) if you have to sleep on extra pillows at night in order to breathe.  As directed    Diet - low sodium heart healthy  As directed    Increase activity slowly  As directed       Procedures Performed:  Dg Chest Port 1 View  03/20/2013   *RADIOLOGY REPORT*  Clinical Data: Shortness of breath  PORTABLE CHEST - 1 VIEW  Comparison: 12/14/2012  Findings: The heart is at the upper limits of normal in size. Vascular congestion is noted with mild interstitial changes.  No focal confluent infiltrate is seen.  IMPRESSION: Changes consistent with mild CHF.   Original Report Authenticated  By: Alcide Clever, M.D.   Admission HPI:  [name] is a 67yo WM with h/o A fib (on warfarin, followed by Dr. Alexandria Lodge), sCHF (last Echo done in Feb 2014 EF 40-45%), HTN, GERD who presents with c/o SOB that began today when he was driving to pick up a friend. Patient reports having quickly worsening SOB with diaphoresis this morning around 7AM, denies CP, lightheadedness, palpitations, abd pain, N/V. Patient reports having similar episodes in the past, but is unsure what he was diagnosed with. Currently, patient feels much better and is no longer SOB and was satting 97% on 5LPM O2 [place]. He denies any previous diagnosis of COPD, though he has about a 50-60 pack year history of smoking. Patient denies diarrhea, urinary complaints, BLE edema. He has not been sick recently. He has been eating and drinking normally and has felt in his usual state of health up until today. He has been seen by cardiology, Dr. Ladona Ridgel.   In the ED, patient was initially managed on bipap, but was weaned off. He received an ABG that showed pH 7.32, pCO2 46.1, pO2 65, bicarb 23.9; CMP significant for Alk phos of 126, AST of 51; CBC showed WBC 17.2, Hb 20.7 (though this is stable from previous) pro BNP 3120; troponin neg x 1; lactic acid 3.65; INR 3.51. EKG showed NSR with no ischemic changes. CXR showed changes c/w mild CHF. Patient received duoneb x 1, solumedrol 125mg  x 1, and lasix 40mg  IV x 1.  Hospital Course by problem list:   # Acute on Chronic systolic CHF: Patient has h/o sCHF and presented with dyspnea and diaphoresis with an elevated BNP, c/w CHF exacerbation. According to the ED, he was in respiratory distress and required BiPAP therapy initially, but he was quickly weaned off of this and managed on supplemental O2 via nasal cannula. He responded rapidly to one dose of lasix 40mg  IV, also received duoneb and one dose of methylprednisolone in ED. We gave him one more dose of lasix 20mg  IV overnight and in the morning patient  was satting 94% on room air and felt back to his baseline. His last Echo done in Feb 2014 showed an EF 40-45%. He is managed on carvedilol, lasix, lisinopril at home- patient reports medication compliance. Patient was discharged on lasix 20mg  daily (takes lasix 20mg  three times weekly at home) until he follows up in clinic where his kidney function and fluid status should be reevaluated.   # Lactic acidosis: Likely metabolic 2/2 increased WOB. Patient had AG 15 on admission, which is likely 2/2 increased lactic acid. Given  patient had significant respiratory distress prior to arrival in the ED, his increased WOB is likely the culprit for an increased lactic acid. Lactic acidosis completely resolved and was 1.5 upon discharge.   # Polycythemia and leukocytosis- -Hb 20.7 and WBC 17.2 on admission, blood smear showed toxic granulation, large platelets, stomatocytes, polychromasia. This appears to be chronic and per chart review, in June 2014 Dr. Dierdre Searles had referred him to heme/onc for further work up, but patient has not been seen by their service yet. I spoke with heme/onc prior to patient's discharge and they agreed to follow up with him as an outpatient- they will call the patient with his appointment.   # HTN: BP stable throughout his hospitalization.. Patient is on coreg 12.5mg  bid, lasix 40mg  three times weekly, lisinopril 5mg  daily at home. Coreg and lisinopril were continued. Lasix dose was increased as per above.  # A fib: Patient is on warfarin and amiodarone and is managed by Dr. Alexandria Lodge. His INR was 3.5 on admission, so we held the coumadin overnight. Repeat INR was 2.38. We went ahead and restarted warfarin at discharge given he has close follow up with Dr. Alexandria Lodge.    Discharge Vitals:   BP 119/63  Pulse 60  Temp(Src) 98.6 F (37 C) (Oral)  Resp 17  Ht 5\' 6"  (1.676 m)  Wt 63.6 kg (140 lb 3.4 oz)  BMI 22.64 kg/m2  SpO2 94%  Discharge Labs:  Results for orders placed during the hospital  encounter of 03/20/13 (from the past 24 hour(s))  TECHNOLOGIST SMEAR REVIEW     Status: None   Collection Time    03/20/13  4:00 PM      Result Value Range   Tech Review LARGE PLATELETS PRESENT    TROPONIN I     Status: None   Collection Time    03/20/13  7:26 PM      Result Value Range   Troponin I <0.30  <0.30 ng/mL  TROPONIN I     Status: None   Collection Time    03/21/13  1:26 AM      Result Value Range   Troponin I <0.30  <0.30 ng/mL  BASIC METABOLIC PANEL     Status: Abnormal   Collection Time    03/21/13  1:50 AM      Result Value Range   Sodium 138  135 - 145 mEq/L   Potassium 3.6  3.5 - 5.1 mEq/L   Chloride 97  96 - 112 mEq/L   CO2 28  19 - 32 mEq/L   Glucose, Bld 135 (*) 70 - 99 mg/dL   BUN 21  6 - 23 mg/dL   Creatinine, Ser 1.61  0.50 - 1.35 mg/dL   Calcium 8.8  8.4 - 09.6 mg/dL   GFR calc non Af Amer 89 (*) >90 mL/min   GFR calc Af Amer >90  >90 mL/min  CBC     Status: Abnormal   Collection Time    03/21/13  1:50 AM      Result Value Range   WBC 20.6 (*) 4.0 - 10.5 K/uL   RBC 5.62  4.22 - 5.81 MIL/uL   Hemoglobin 19.6 (*) 13.0 - 17.0 g/dL   HCT 04.5 (*) 40.9 - 81.1 %   MCV 96.1  78.0 - 100.0 fL   MCH 34.9 (*) 26.0 - 34.0 pg   MCHC 36.3 (*) 30.0 - 36.0 g/dL   RDW 91.4  78.2 - 95.6 %   Platelets 279  150 - 400 K/uL  LACTIC ACID, PLASMA     Status: None   Collection Time    03/21/13  1:50 AM      Result Value Range   Lactic Acid, Venous 1.5  0.5 - 2.2 mmol/L  PROTIME-INR     Status: Abnormal   Collection Time    03/21/13  1:50 AM      Result Value Range   Prothrombin Time 25.2 (*) 11.6 - 15.2 seconds   INR 2.38 (*) 0.00 - 1.49    Signed: Windell Hummingbird, MD 03/21/2013, 3:53 PM   Time Spent on Discharge: 35 minutes Services Ordered on Discharge: none

## 2013-03-21 NOTE — Progress Notes (Signed)
Patient has been discharged home. IV has been D/C. Have reviewed all discharge instructions with the patient. Found out that the patient has not been weighinh himself because he has no scale. Reminded him of the importance of weighing self since he has CHF.All questions and concerns have been answered.

## 2013-03-21 NOTE — H&P (Signed)
INTERNAL MEDICINE TEACHING SERVICE Attending Admission Note  Date: 03/21/2013  Patient name: [name]  Medical record number: [id number]  Date of birth: [birth year]    I have seen and evaluated [name] and discussed their care with the Residency Team.   66 yr. Old hispanic male w/ hx Afib on coumadin, Chronic systolic CHF, HTN, GERD, presented with SOB.  He is a heavy tobacco user. He received IV Lasix in the ED and initially was managed on BiPAP and feels back to baseline this morning.  He has evidence of acute on chronic systolic heart failure. On exam, he has some scattered bilateral expiratory wheeze. He states he forgot to take his medications yesterday.   He has ruled out for ACS. He is noted to have a long standing polycythemia and leukocytosis. He needs hematology evaluation, coordinate this as outpatient.  He will need outpatient follow up with his cardiologist. He will need outpatient PFTs. Otherwise, he is medically stable for discharge today.  Jonah Blue, DO, FACP Faculty [facility] Internal Medicine Residency Program 03/21/2013, 12:20 PM

## 2013-03-21 NOTE — Progress Notes (Signed)
Utilization review completed.  

## 2013-03-21 NOTE — Progress Notes (Signed)
Subjective: Patient feels completely back to baseline. He is no longer SOB and feels ready to go home. He was satting 93% on R/A during my interview. Denies CP, diaphoresis, N/V. He is able to ambulate without any SOB. He provided additional information that he forgot to take his "fluid pill" yesterday before he was admitted.   Objective: Vital signs in last 24 hours: Filed Vitals:   03/21/13 0414 03/21/13 0800 03/21/13 0841 03/21/13 1147  BP:  119/63    Pulse:  57 60   Temp:  98.5 F (36.9 C)  98.6 F (37 C)  TempSrc:  Oral  Oral  Resp:  17    Height:      Weight: 63.6 kg (140 lb 3.4 oz)     SpO2:  94%     Weight change:   Intake/Output Summary (Last 24 hours) at 03/21/13 1542 Last data filed at 03/20/13 2300  Gross per 24 hour  Intake      3 ml  Output    450 ml  Net   -447 ml   Physical Exam General: alert, cooperative, and in no apparent distress; satting 93-94% on r/a HEENT: pupils equal round and reactive to light, vision grossly intact, oropharynx clear and non-erythematous Neck: supple, no lymphadenopathy, JVD Lungs: clear to ascultation bilaterally, normal work of respiration, scattered expiratory wheezes R>L, no rales or rhonchi  Heart: regular rate and rhythm, no murmurs, gallops, or rubs Abdomen: soft, non-tender, non-distended, normal bowel sounds  Extremities: warm extremities, no BLE edema Neurologic: alert & oriented X3, moving all extremities spontaneously  Lab Results: Basic Metabolic Panel:  Recent Labs Lab 03/20/13 0815 03/21/13 0150  NA 140 138  K 3.6 3.6  CL 101 97  CO2 24 28  GLUCOSE 236* 135*  BUN 10 21  CREATININE 0.89 0.86  CALCIUM 9.1 8.8   Liver Function Tests:  Recent Labs Lab 03/20/13 0815  AST 51*  ALT 32  ALKPHOS 126*  BILITOT 0.4  PROT 7.8  ALBUMIN 3.5   CBC:  Recent Labs Lab 03/20/13 0815 03/21/13 0150  WBC 17.2* 20.6*  NEUTROABS 12.6*  --   HGB 20.7* 19.6*  HCT 58.3* 54.0*  MCV 99.0 96.1  PLT 360 279    Cardiac Enzymes:  Recent Labs Lab 03/20/13 1443 03/20/13 1926 03/21/13 0126  TROPONINI <0.30 <0.30 <0.30   BNP:  Recent Labs Lab 03/20/13 0815  PROBNP 3120.0*   Coagulation:  Recent Labs Lab 03/20/13 0815 03/21/13 0150  LABPROT 33.9* 25.2*  INR 3.51* 2.38*   Blood smear- toxic granulation, large platelets, stomatocytes, polychromasia  Lactate 1.5  Micro Results: Recent Results (from the past 240 hour(s))  MRSA PCR SCREENING     Status: None   Collection Time    03/20/13  1:26 PM      Result Value Range Status   MRSA by PCR NEGATIVE  NEGATIVE Final   Comment:            The GeneXpert MRSA Assay (FDA     approved for NASAL specimens     only), is one component of a     comprehensive MRSA colonization     surveillance program. It is not     intended to diagnose MRSA     infection nor to guide or     monitor treatment for     MRSA infections.   Studies/Results: Dg Chest Port 1 View  03/20/2013   *RADIOLOGY REPORT*  Clinical Data: Shortness of breath  PORTABLE CHEST - 1 VIEW  Comparison: 12/14/2012  Findings: The heart is at the upper limits of normal in size. Vascular congestion is noted with mild interstitial changes.  No focal confluent infiltrate is seen.  IMPRESSION: Changes consistent with mild CHF.   Original Report Authenticated By: Alcide Clever, M.D.   Medications: I have reviewed the patient's current medications. Scheduled Meds: . amiodarone  200 mg Oral Daily  . aspirin EC  81 mg Oral Daily  . carvedilol  12.5 mg Oral BID WC  . furosemide  40 mg Intravenous Daily  . lisinopril  5 mg Oral Daily  . sodium chloride  3 mL Intravenous Q12H  . warfarin  5 mg Oral ONCE-1800  . Warfarin - Pharmacist Dosing Inpatient   Does not apply q1800   Continuous Infusions:  PRN Meds:.albuterol, albuterol, ipratropium  Assessment/Plan: This is a 66yo WM with PMH of HTN, A fib, systolic CHF who presents with SOB and diaphoresis with elevated pro BNP, which likely  represents acute on chronic systolic CHF.   # Acute on Chronic systolic CHF: Patient has h/o sCHF and presents with dyspnea and diaphoresis with an elevated BNP. This is most likely an acute CHF exacerbation especially since his SOB almost completely resolved after receiving lasix in the ED. His last Echo done in Feb 2014 showed an EF 40-45%. He is managed on carvedilol, lasix, lisinopril at home- patient reports medication compliance, but when asked is unable to report exactly what he is taking. This episode of SOB may also be explained by COPD given patient's extensive smoking history. Additionally, patient does have scattered wheezing in b/l lung fields today.   -s/p lasix 40mg  x 1 in ED; will give lasix 20mg  IV once today and start lasix 40mg  PO daily tomorrow  -daily weights  -strict I/Os  -duoneb prn  -tele  -continuous pulse ox  -tolerated regular diet today -troponin neg x 3  # Lactic acidosis: Resolved. Likely metabolic 2/2 increased WOB upon arrival to ED. Now patient has normal WOB and lactate is wnl.    # Polycythemia and leukocytosis- This appears to be chronic and per chart review, in June 2014 Dr. Dierdre Searles had referred him to heme/onc for further work up, but patient has not been seen by their service yet. I spoke with heme/onc today and they will see him as an outpatient. They will call him with his appointment day and time.  - Hb 20.7 and WBC 17.2 on admission   # HTN: BP stable. Patient is on coreg 12.5mg  bid, lasix 40mg  three times weekly, lisinopril 5mg  daily at home.  -continue home medications (with the exception of increased frequency of lasix to once daily)  # A fib: Patient is on warfarin and amiodarone and is managed by Dr. Alexandria Lodge. His INR was 3.5 on admission. INR is 2.38 today. -holding coumadin for supratherapeutic INR- warfarin dosing per pharmacy  -continue amiodarone   # VTE: holding coumadin since INR is supratherapeutic   # Diet: regular diet  Code status: full     Dispo: Discharge today  The patient does have a current PCP Annett Gula, MD) and does need an [facility] hospital follow-up appointment after discharge.  The patient does not have transportation limitations that hinder transportation to clinic appointments.  .Services Needed at time of discharge: Y = Yes, Blank = No PT:   OT:   RN:   Equipment:   Other:     LOS: 1 day   Windell Hummingbird,  MD 03/21/2013, 3:42 PM

## 2013-03-21 NOTE — Progress Notes (Signed)
ANTICOAGULATION CONSULT NOTE  Pharmacy Consult for Warfarin Indication: Afib  Allergies  Allergen Reactions  . Codeine Rash   Labs:  Recent Labs  03/20/13 0815 03/20/13 1443 03/20/13 1926 03/21/13 0126 03/21/13 0150  HGB 20.7*  --   --   --  19.6*  HCT 58.3*  --   --   --  54.0*  PLT 360  --   --   --  279  LABPROT 33.9*  --   --   --  25.2*  INR 3.51*  --   --   --  2.38*  CREATININE 0.89  --   --   --  0.86  TROPONINI  --  <0.30 <0.30 <0.30  --    Estimated Creatinine Clearance: 77 ml/min (by C-G formula based on Cr of 0.86).   Assessment: 67 yo male admitted after being found in a vehicle with shortness of breath.  He was diaphoretic, tachycardic, tachypneic and dyspneic upon arrival to the ED.  He is on chronic anticoagulation for a history of Afib.  His CBC reveals an elevated H/H, platelets of 360K and his INR today is 33.9/3.51.  We have been asked to follow his Warfarin therapy while he is hospitalized.  He is followed in the coumadin clinic with current recommendations of therapy noted below.  His last INR was on 8/25 and at that time it was 1.8.  He is without noted bleeding complications.  Anticoagulation Monitoring 03/13/2013  INR goal 2.0-3.0  Assoc. INR Date 03/13/2013  Associated INR 1.80  Pt. deviation No  Current weekly dose 22.5 mg  Sunday dose 5 mg  Monday dose 2.5 mg  Tuesday dose 5 mg  Wednesday dose 2.5 mg  Thursday dose 5 mg  Friday dose 2.5 mg  Saturday dose 5 mg  Weekly dose 27.5 mg  Return date 04/03/2013   INR today = 2.38  Goal of Therapy:  INR 2-3 Monitor platelets by anticoagulation protocol: Yes   Plan:  - Coumadin 5 mg po x 1 today - Daily INR  Thank you. Okey Regal, PharmD [phone]  03/21/2013,8:48 AM

## 2013-03-22 NOTE — Discharge Summary (Signed)
  Date: 03/22/2013  Patient name: [name]  Medical record number: [id number]  Date of birth: 03/09/1946   This patient has been seen and the plan of care was discussed with the house staff. Please see their note for complete details. I concur with their findings and plan.  Jonah Blue, DO, FACP Faculty [facility] Internal Medicine Residency Program 03/22/2013, 10:30 AM

## 2013-03-24 ENCOUNTER — Encounter: Payer: Self-pay | Admitting: Internal Medicine

## 2013-03-24 ENCOUNTER — Ambulatory Visit (INDEPENDENT_AMBULATORY_CARE_PROVIDER_SITE_OTHER): Payer: Medicare Other | Admitting: Internal Medicine

## 2013-03-24 VITALS — BP 104/64 | HR 52 | Temp 97.7°F | Ht 66.0 in | Wt 179.1 lb

## 2013-03-24 DIAGNOSIS — D45 Polycythemia vera: Secondary | ICD-10-CM

## 2013-03-24 DIAGNOSIS — I5023 Acute on chronic systolic (congestive) heart failure: Secondary | ICD-10-CM

## 2013-03-24 DIAGNOSIS — Z23 Encounter for immunization: Secondary | ICD-10-CM

## 2013-03-24 DIAGNOSIS — I4891 Unspecified atrial fibrillation: Secondary | ICD-10-CM

## 2013-03-24 DIAGNOSIS — R0602 Shortness of breath: Secondary | ICD-10-CM

## 2013-03-24 DIAGNOSIS — I509 Heart failure, unspecified: Secondary | ICD-10-CM

## 2013-03-24 DIAGNOSIS — D751 Secondary polycythemia: Secondary | ICD-10-CM

## 2013-03-24 LAB — BASIC METABOLIC PANEL WITH GFR
BUN: 16 mg/dL (ref 6–23)
CO2: 29 mEq/L (ref 19–32)
Chloride: 104 mEq/L (ref 96–112)
Creat: 0.94 mg/dL (ref 0.50–1.35)
GFR, Est African American: >89 mL/min
Glucose, Bld: 115 mg/dL — ABNORMAL HIGH (ref 70–99)
Potassium: 3.8 mEq/L (ref 3.5–5.3)
Sodium: 142 mEq/L (ref 135–145)

## 2013-03-24 MED ORDER — POTASSIUM CHLORIDE ER 20 MEQ PO TBCR: EXTENDED_RELEASE_TABLET | ORAL | Status: DC

## 2013-03-24 NOTE — Assessment & Plan Note (Signed)
Patient was recently treated for acute exacerbation of congestive heart failure. Today he feels good. He does not have PND or orthopnea. He does not have any leg edema. But his body weight increased more than 3 pounds since his discharge from hospital. He was supposed to take Lasix 20 mg daily, by misunderstanding,  he is taking 20 mg Lasix every other day. Although he feels good and does not have leg edema or JVD, his body weight increased by at least 3 pounds.   - Will increased his Lasix dose from 20 mg every other day to 20 mg daily from now. - will check his BMP today and BMP on 9/8 for K level

## 2013-03-24 NOTE — Progress Notes (Signed)
Patient ID: [name], male   DOB: 20-Mar-1946, 67 y.o.   MRN: [id number] Subjective:   Patient ID: [name] male   DOB: Jul 04, 1946 67 y.o.   MRN: [id number]  CC:  Hospital followup visit.  HPI:  [name] is a 67 y.o. man with past medical history as outlined below, who presents for a hospital followup visit today.   # Acute on Chronic systolic CHF: patient was hospitalized from a 9/1-9/2 because of acute exacerbation of congestive heart failure. He has 2-D echo Feb 2014, EF 40-45% with moderately to severely left atrium dilation. He responded to the diuretic treatment quickly. He also had a lactic acidosis in hospital the has completely resolved at discharge. He was discharged on Lasix 20 mg daily (was on lasix 20mg  three times weekly at home), but by misunderstanding, he is taking lasix 20 mg every other day after being discharged. He is also on carvedilol and lisinopril now.  His body weight was documented as 140 LBs at discharge, which is most likely not accurate. His BW was  between 160 to 180 LBs in the past year. I assume that his BW was 170 LBs at discharge. Today patient feels good. He does not have chest pain, shortness of breath, leg edema. He does not have PND or orthopnea.  # Polycythemia and leukocytosis- Patient's recent Hgb was 19.6 on 03/21/13. His previous blood smear showed toxic granulation, large platelets, stomatocytes, polychromasia. This appears to be chronic and per chart review, in June 2014.  Dr. Dierdre Searles had referred him to heme/onc for further work up, but patient has not been seen by their service yet. Ptient recently changed his cell phone number to [phone]. It is possible the Hem/Onc office may have not been able to reach him by phone. Will make an appointment again for patient.   # HTN: It is well controled. bp is 104/64 today. No change needed to his med.   # A fib: Patient is on warfarin. His INR was 2.38 on 03/21/13. He has an appointment with  Dr. gross on 03/27/13. He does not have any bleeding tendency to  ROS:  Denies fever, chills, fatigue, headaches, cough, chest pain, SOB, abdominal pain,diarrhea, constipation, dysuria, urgency, frequency, hematuria, joint pain or leg swelling.   Past Medical History  Diagnosis Date  . Cardiomyopathy   . Substance abuse     alcohol  . Hypertension   . Atrial flutter      status post ablation on 09/11/2010  . CHF (congestive heart failure)     EF 35% with diffuse hypokinesis  . Atrial fibrillation   . Shortness of breath     "all the time lately" (12/14/2012)  . GERD (gastroesophageal reflux disease)   . Hepatitis     "think I had that once; a long time ago; from dirty needles I think" (12/14/2012)   Current Outpatient Prescriptions  Medication Sig Dispense Refill  . albuterol (PROVENTIL HFA;VENTOLIN HFA) 108 (90 BASE) MCG/ACT inhaler Inhale 2 puffs into the lungs every 6 (six) hours as needed for wheezing.  1 Inhaler  2  . amiodarone (PACERONE) 200 MG tablet Take 1 tablet (200 mg total) by mouth daily.  30 tablet  5  . carvedilol (COREG) 12.5 MG tablet Take 1 tablet (12.5 mg total) by mouth 2 (two) times daily with a meal.  180 tablet  1  . furosemide (LASIX) 40 MG tablet Take 0.5 tablets (20 mg total) by mouth daily.  30 tablet  1  .  lisinopril (PRINIVIL,ZESTRIL) 5 MG tablet Take 1 tablet (5 mg total) by mouth daily.  30 tablet  5  . potassium chloride 20 MEQ TBCR Please do not take it until I tell you to.  30 tablet  0  . warfarin (COUMADIN) 5 MG tablet Take 2.5-5 mg by mouth daily. 2.5 mg Monday Wednesday and Friday and 5 mg all other days       No current facility-administered medications for this visit.   Family History  Problem Relation Age of Onset  . Diabetes Mother   . Hypertension Mother   . Cirrhosis Father   . Alcohol abuse Father    History   Social History  . Marital Status: Single    Spouse Name: N/A    Number of Children: N/A  . Years of Education: N/A    Social History Main Topics  . Smoking status: Current Every Day Smoker -- 0.10 packs/day for 40 years    Types: Cigarettes  . Smokeless tobacco: Never Used     Comment: patient smoked 1-2ppd x 50 years, now smoking approx 4-5 cigs per day x 1 year  . Alcohol Use: 0.0 oz/week     Comment: drinks a beer once in a while for the past year, but has h/o heavy alcohol use for many years before this  . Drug Use: Yes    Special: "Crack" cocaine, Cocaine, Heroin     Comment: 03/20/2013 "been clean for awhile"; endorses using heroin "all my life" up until about a year ago  . Sexual Activity: Not Currently   Other Topics Concern  . None   Social History Narrative   Moved here from [place]. Lives with common law wife and grandchildren. He is retired from "general labor."    Review of Systems: Full 14-point review of systems otherwise negative. See HPI.   Objective:  Physical Exam: Filed Vitals:   03/24/13 1327  BP: 104/64  Pulse: 52  Temp: 97.7 F (36.5 C)  TempSrc: Oral  Height: 5\' 6"  (1.676 m)  Weight: 179 lb 1.6 oz (81.239 kg)  SpO2: 97%   Constitutional: Vital signs reviewed.  Patient is a well-developed and well-nourished, in no acute distress and cooperative with exam.   HEENT:  Head: Normocephalic and atraumatic Ear: TM normal bilaterally Mouth: no erythema or exudates, MMM Eyes: PERRL, EOMI, conjunctivae normal, No scleral icterus.  Neck: Supple, Trachea midline normal ROM, No JVD,  Cardiovascular: RRR, S1 normal, S2 normal, no MRG, pulses symmetric and intact bilaterally Pulmonary/Chest: has mild rhonchi on the left side.  no wheezes, rales, or rhonchi Abdominal: Soft. Non-tender, non-distended, bowel sounds are normal, no masses, organomegaly, or guarding present.  Musculoskeletal: No joint deformities, erythema, or stiffness, ROM full and non-tender Extremities: There is no leg edema.  Neurological: A&O x3, Strength is normal and symmetric bilaterally, cranial nerve  II-XII are grossly intact, no focal motor deficit, sensory intact to light touch bilaterally.  Skin: Warm, dry and intact. No rash, cyanosis, or clubbing.  Psychiatric: Normal mood and affect. speech and behavior is normal. Judgment and thought content normal. Cognition and memory are normal.   Assessment & Plan:

## 2013-03-24 NOTE — Progress Notes (Signed)
Case discussed with Dr. Niu at the time of the visit.  We reviewed the resident's history and exam and pertinent patient test results.  I agree with the assessment, diagnosis, and plan of care documented in the resident's note.    

## 2013-03-24 NOTE — Assessment & Plan Note (Signed)
Patient's heart rate is well controlled. He's currently taking amiodarone. Today heart rate is 52 which is bradycardia. But patient does not have symptoms such as palpitation or chest pain or shortness of breath. He is taking Coumadin currently. His INR was 2.38 on 03/21/13. He has an appointment with the Dr. Alexandria Lodge on 03/27/13.   -will continue current dose of amiodarone. -Continue Coumadin

## 2013-03-24 NOTE — Patient Instructions (Signed)
1. Please take lasix 20 mg daily from now. 2. Please come to clinic lab to check your electrolytes on 03/27/13 (9:00AM to 4:30 PM). You do not need appointment 3. I give your potassium pill prescription, but do not take it until I tell you to.  4. If you have worsening of your symptoms or new symptoms arise, please call the clinic ([phone]), or go to the ER immediately if symptoms are severe.  You have done great job in taking all your medications. I appreciate it very much. Please continue doing that.

## 2013-03-27 ENCOUNTER — Ambulatory Visit (INDEPENDENT_AMBULATORY_CARE_PROVIDER_SITE_OTHER): Payer: Medicare Other | Admitting: Pharmacist

## 2013-03-27 DIAGNOSIS — I4891 Unspecified atrial fibrillation: Secondary | ICD-10-CM

## 2013-03-27 DIAGNOSIS — Z7901 Long term (current) use of anticoagulants: Secondary | ICD-10-CM

## 2013-03-27 LAB — POCT INR

## 2013-03-28 NOTE — Patient Instructions (Signed)
Patient instructed to take medications as defined in the Anti-coagulation Track section of this encounter.  Patient instructed to take today's dose.  Patient verbalized understanding of these instructions.    

## 2013-03-28 NOTE — Progress Notes (Signed)
Anti-Coagulation Progress Note  [name] is a 67 y.o. male who is currently on an anti-coagulation regimen.    RECENT RESULTS: Recent results are below, the most recent result is correlated with a dose of 27.5 mg. per week: Lab Results  Component Value Date   INR 2.80 03/28/2013   INR Final result POC testing 03/27/2013   INR 2.38* 03/21/2013    ANTI-COAG DOSE: Anticoagulation Dose Instructions as of 03/27/2013     Glynis Smiles Tue Wed Thu Fri Sat   New Dose 5 mg 2.5 mg 5 mg 2.5 mg 5 mg 2.5 mg 5 mg       ANTICOAG SUMMARY: Anticoagulation Episode Summary   Current INR goal 2.0-3.0  Next INR check 04/17/2013  INR from last check Final result POC testing (03/27/2013)  The INR is not numeric and cannot be flagged as normal/abnormal.  Most recent INR 2.80 (03/28/2013)  Weekly max dose   Target end date Indefinite  [facility]  Preferred lab   Send INR reminders to    Indications  Atrial fibrillation [427.31] Long term (current) use of anticoagulants [V58.61]        Comments         ANTICOAG TODAY: Anticoagulation Summary as of 03/27/2013   INR goal 2.0-3.0  Selected INR Final result POC testing (03/27/2013)  The INR is not numeric and cannot be flagged as normal/abnormal.  Next INR check 04/17/2013  Target end date Indefinite   Indications  Atrial fibrillation [427.31] Long term (current) use of anticoagulants [V58.61]      Anticoagulation Episode Summary   [facility]   Preferred lab    Send INR reminders to    Comments       PATIENT INSTRUCTIONS: Patient Instructions  Patient instructed to take medications as defined in the Anti-coagulation Track section of this encounter.  Patient instructed to take today's dose.  Patient verbalized understanding of these instructions.       FOLLOW-UP Return in about 3 weeks (around 04/17/2013) for Follow up INR at 0945h.  Hulen Luster, III Pharm.D., CACP

## 2013-03-29 NOTE — Progress Notes (Signed)
The indication for anticoagulation is atrial fibrillation.  I have reviewed Dr. Saralyn Pilar note.

## 2013-03-31 ENCOUNTER — Telehealth: Payer: Self-pay | Admitting: Hematology and Oncology

## 2013-03-31 NOTE — Telephone Encounter (Signed)
Left pt vm to return call in ref to np appt. °

## 2013-04-03 ENCOUNTER — Ambulatory Visit: Payer: Medicare Other

## 2013-04-03 ENCOUNTER — Encounter: Payer: Self-pay | Admitting: Cardiology

## 2013-04-03 NOTE — Progress Notes (Unsigned)
Patient ID: [name], male   DOB: [birth year], 67 y.o.   MRN: [id number]    [name], [name]    Date of visit:  04/03/2013 DOB:  03-13-1946    Age:  67 yrs. Medical record number:  [id number]   Account number:  [id number] Primary Care Provider: Margarito Liner ____________________________ CURRENT DIAGNOSES  1. Cardiomyopathy Idiopathic  2. Personal History Of Noncompliance With Medical Treatment Presenting Hazards To Health  3. Long-term (current) use of anticoagulants  4. Obesity(BMI30-40)  5. Cardiomyopathy-idiopathic  6. Atrial Fibrillation ____________________________ ALLERGIES  Codeine, Intolerance-unknown ____________________________ MEDICATIONS  1. lisinopril 5 mg Tablet, 1 p.o. daily  2. warfarin 5 mg tablet, Take as directed  3. amiodarone 200 mg tablet, 1 p.o. daily  4. carvedilol 12.5 mg tablet, BID  5. furosemide 40 mg tablet, 1 p.o. daily ____________________________ CHIEF COMPLAINTS  Followup of Atrial Fibrillation and cardiomyopathy ____________________________ HISTORY OF PRESENT ILLNESS  Patient seen for cardiac followup. He was admitted to the hospital with shortness of breath earlier in the month. At some point he had also been referred to Dr. Sharrell Ku for unclear reasons as I had been on seeing him previously. He says that he feels well right now and is not currently having severe shortness of breath or edema. He says that he can do most of his activities without difficulty. He has had no side effects from amiodarone therapy. His TSH in May was normal. He denies angina and has no PND, orthopnea or claudication. ____________________________ PAST HISTORY  Past Medical Illnesses:  obesity, polycythemia-secondary;  Cardiovascular Illnesses:  atrial flutter, cardiomyopathy(dilated);  Surgical Procedures:  hemmoroidectomy;  Cardiology Procedures-Invasive:  RF ablation for atrial flutter February 2012;  Cardiology Procedures-Noninvasive:  echocardiogram  February 2014;  LVEF of 40% documented via echocardiogram on 08/31/2012,   ____________________________ CARDIO-PULMONARY TEST DATES EKG Date:  04/03/2013;  Echocardiography Date: 08/31/2012;  Chest Xray Date: 08/30/2012;   ____________________________ SOCIAL HISTORY Alcohol Use:  history of alcohol abuse, currently not drinking;  Smoking:  smokes cigarettes, less than 1 ppd;  Diet:  regular diet;  Lifestyle:  single;  Exercise:  no regular exercise;  Occupation:  retired;  Residence:  lives with roommate;   ____________________________ REVIEW OF SYSTEMS General:  feels well, no change in exercise tolerance. Eyes: denies diplopia, history of glaucoma or visual problems. Respiratory: denies dyspnea, cough, wheezing or hemoptysis. Cardiovascular:  please review HPI Abdominal: dyspepsia Musculoskeletal:  denies arthritis, venous insufficiency, or muscle weakness. Neurological:  denies headaches, stroke, or TIA  ____________________________ PHYSICAL EXAMINATION VITAL SIGNS  Blood Pressure:  94/60 Sitting, Left arm, regular cuff   Pulse:  60/min. Weight:  176.00 lbs. Height:  60"BMI: 34  Constitutional:  pleasant Hispanic male in no acute distress, mildly obese Skin:  warm and dry to touch, no apparent skin lesions, or masses noted. Head:  normocephalic, normal hair pattern, no masses or tenderness ENT:  ears, nose and throat reveal no gross abnormalities.  Dentition good. Neck:  supple, without massess. No JVD, thyromegaly or carotid bruits. Carotid upstroke normal. Chest:  normal symmetry, clear to auscultation and percussion. Cardiac:  regular rhythm, normal S1 and S2, No S3 or S4, no murmurs, gallops or rubs detected. Peripheral Pulses:  the femoral,dorsalis pedis, and posterior tibial pulses are full and equal bilaterally with no bruits auscultated. Extremities & Back:  no edema present Neurological:  no gross motor or sensory deficits noted, affect appropriate, oriented  x3. ____________________________ MOST RECENT LIPID PANEL 09/08/10  CHOL TOTL 143 mg/dl, LDL 98 NM, HDL  34 mg/dl, TRIGLYCER 57 mg/dl and CHOL/HDL 4.2 (Calc) ____________________________ IMPRESSIONS/PLAN  1. Idiopathic cardiomyopathy 2. History of atrial fibrillation currently in sinus rhythm on amiodarone 3. Cigarette abuse again advised to stop 4. Long-term anticoagulation with warfarin  Recommendations:  Discussed importance of smoking cessation with him. I would like for him to have a followup echocardiogram to determine if he has had improvement in his left ventricular function. Followup otherwise in 6 months. EKG shows sinus bradycardia. ____________________________ TODAYS ORDERS  1. 12 Lead EKG: Today  2. 2D, color flow, doppler: First Available  3. Return Visit: 6 months  4. 12 Lead EKG: 6 months                       ____________________________ Cardiology Physician:  Darden Palmer MD [facility]

## 2013-04-05 ENCOUNTER — Telehealth: Payer: Self-pay | Admitting: Hematology and Oncology

## 2013-04-05 NOTE — Telephone Encounter (Signed)
2ND. LVOM FOR PT TO RETURN CALL IN RE TO REFERRAL.  °

## 2013-04-17 ENCOUNTER — Ambulatory Visit (INDEPENDENT_AMBULATORY_CARE_PROVIDER_SITE_OTHER): Payer: Medicare Other | Admitting: Pharmacist

## 2013-04-17 DIAGNOSIS — I4891 Unspecified atrial fibrillation: Secondary | ICD-10-CM

## 2013-04-17 DIAGNOSIS — Z7901 Long term (current) use of anticoagulants: Secondary | ICD-10-CM

## 2013-04-17 LAB — POCT INR: INR: 2.9

## 2013-04-17 MED ORDER — WARFARIN SODIUM 5 MG PO TABS: ORAL_TABLET | ORAL | Status: DC

## 2013-04-17 NOTE — Patient Instructions (Signed)
Patient instructed to take medications as defined in the Anti-coagulation Track section of this encounter.  Patient instructed to take today's dose.  Patient verbalized understanding of these instructions.    

## 2013-04-17 NOTE — Progress Notes (Signed)
Anti-Coagulation Progress Note  [name] is a 67 y.o. male who is currently on an anti-coagulation regimen.    RECENT RESULTS: Recent results are below, the most recent result is correlated with a dose of 27.5 mg. per week: Lab Results  Component Value Date   INR 2.90 04/17/2013   INR 2.80 03/28/2013   INR Final result POC testing 03/27/2013    ANTI-COAG DOSE: Anticoagulation Dose Instructions as of 04/17/2013     Glynis Smiles Tue Wed Thu Fri Sat   New Dose 5 mg 2.5 mg 5 mg 2.5 mg 5 mg 2.5 mg 5 mg       ANTICOAG SUMMARY: Anticoagulation Episode Summary   Current INR goal 2.0-3.0  Next INR check 05/08/2013  INR from last check 2.90 (04/17/2013)  Weekly max dose   Target end date Indefinite  [facility]  Preferred lab   Send INR reminders to    Indications  Atrial fibrillation [427.31] Long term (current) use of anticoagulants [V58.61]        Comments         ANTICOAG TODAY: Anticoagulation Summary as of 04/17/2013   INR goal 2.0-3.0  Selected INR 2.90 (04/17/2013)  Next INR check 05/08/2013  Target end date Indefinite   Indications  Atrial fibrillation [427.31] Long term (current) use of anticoagulants [V58.61]      Anticoagulation Episode Summary   [facility] check [facility]   Preferred lab    Send INR reminders to    Comments       PATIENT INSTRUCTIONS: Patient Instructions  Patient instructed to take medications as defined in the Anti-coagulation Track section of this encounter.  Patient instructed to take today's dose.  Patient verbalized understanding of these instructions.       FOLLOW-UP Return in 3 weeks (on 05/08/2013) for Follow up INR at 0915h.  Hulen Luster, III Pharm.D., CACP

## 2013-04-20 ENCOUNTER — Encounter: Payer: Self-pay | Admitting: *Deleted

## 2013-04-27 ENCOUNTER — Other Ambulatory Visit: Payer: Self-pay | Admitting: Internal Medicine

## 2013-04-27 ENCOUNTER — Other Ambulatory Visit: Payer: Self-pay | Admitting: *Deleted

## 2013-04-27 MED ORDER — CARVEDILOL 12.5 MG PO TABS: 12.5000 mg | ORAL_TABLET | Freq: Two times a day (BID) | ORAL | Status: DC

## 2013-04-27 NOTE — Telephone Encounter (Signed)
Pt is out of meds

## 2013-05-08 ENCOUNTER — Ambulatory Visit (INDEPENDENT_AMBULATORY_CARE_PROVIDER_SITE_OTHER): Payer: Medicare Other | Admitting: Pharmacist

## 2013-05-08 DIAGNOSIS — Z7901 Long term (current) use of anticoagulants: Secondary | ICD-10-CM

## 2013-05-08 DIAGNOSIS — I4891 Unspecified atrial fibrillation: Secondary | ICD-10-CM

## 2013-05-08 LAB — POCT INR: INR: 3

## 2013-05-08 NOTE — Patient Instructions (Signed)
Patient instructed to take medications as defined in the Anti-coagulation Track section of this encounter.  Patient instructed to  today's dose.  Patient verbalized understanding of these instructions.     

## 2013-05-08 NOTE — Progress Notes (Signed)
Anti-Coagulation Progress Note  [name] is a 67 y.o. male who is currently on an anti-coagulation regimen.    RECENT RESULTS: Recent results are below, the most recent result is correlated with a dose of 30 mg. per week: Lab Results  Component Value Date   INR 3.0 05/08/2013   INR 2.90 04/17/2013   INR 2.80 03/28/2013    ANTI-COAG DOSE: Anticoagulation Dose Instructions as of 05/08/2013     Glynis Smiles Tue Wed Thu Fri Sat   New Dose 2.5 mg 2.5 mg 5 mg 2.5 mg 5 mg 2.5 mg 5 mg       ANTICOAG SUMMARY: Anticoagulation Episode Summary   Current INR goal 2.0-3.0  Next INR check 06/05/2013  INR from last check 3.0 (05/08/2013)  Weekly max dose   Target end date Indefinite  [facility]  Preferred lab   Send INR reminders to    Indications  Atrial fibrillation [427.31] Long term (current) use of anticoagulants [V58.61]        Comments         ANTICOAG TODAY: Anticoagulation Summary as of 05/08/2013   INR goal 2.0-3.0  Selected INR 3.0 (05/08/2013)  Next INR check 06/05/2013  Target end date Indefinite   Indications  Atrial fibrillation [427.31] Long term (current) use of anticoagulants [V58.61]      Anticoagulation Episode Summary   [facility]   Preferred lab    Send INR reminders to    Comments       PATIENT INSTRUCTIONS: Patient Instructions  Patient instructed to take medications as defined in the Anti-coagulation Track section of this encounter.  Patient instructed to  today's dose.  Patient verbalized understanding of these instructions.      FOLLOW-UP Return in about 4 weeks (around 06/05/2013) for Follow up INR at 0930h.  Hulen Luster, III Pharm.D., CACP

## 2013-05-18 ENCOUNTER — Observation Stay (HOSPITAL_COMMUNITY)
Admission: EM | Admit: 2013-05-18 | Discharge: 2013-05-19 | Disposition: A | Discharge status: Home / Self Care | Payer: Medicare Other | Attending: Infectious Diseases | Admitting: Infectious Diseases

## 2013-05-18 ENCOUNTER — Emergency Department (HOSPITAL_COMMUNITY): Payer: Medicare Other

## 2013-05-18 ENCOUNTER — Encounter (HOSPITAL_COMMUNITY): Payer: Self-pay | Admitting: Emergency Medicine

## 2013-05-18 DIAGNOSIS — Z7901 Long term (current) use of anticoagulants: Secondary | ICD-10-CM | POA: Diagnosis not present

## 2013-05-18 DIAGNOSIS — D45 Polycythemia vera: Secondary | ICD-10-CM | POA: Diagnosis present

## 2013-05-18 DIAGNOSIS — J440 Chronic obstructive pulmonary disease with acute lower respiratory infection: Principal | ICD-10-CM | POA: Insufficient documentation

## 2013-05-18 DIAGNOSIS — D751 Secondary polycythemia: Secondary | ICD-10-CM | POA: Insufficient documentation

## 2013-05-18 DIAGNOSIS — I428 Other cardiomyopathies: Secondary | ICD-10-CM | POA: Diagnosis not present

## 2013-05-18 DIAGNOSIS — I10 Essential (primary) hypertension: Secondary | ICD-10-CM | POA: Diagnosis not present

## 2013-05-18 DIAGNOSIS — I5022 Chronic systolic (congestive) heart failure: Secondary | ICD-10-CM | POA: Insufficient documentation

## 2013-05-18 DIAGNOSIS — R05 Cough: Secondary | ICD-10-CM

## 2013-05-18 DIAGNOSIS — I509 Heart failure, unspecified: Secondary | ICD-10-CM | POA: Insufficient documentation

## 2013-05-18 DIAGNOSIS — I5033 Acute on chronic diastolic (congestive) heart failure: Secondary | ICD-10-CM | POA: Diagnosis present

## 2013-05-18 DIAGNOSIS — I4891 Unspecified atrial fibrillation: Secondary | ICD-10-CM | POA: Insufficient documentation

## 2013-05-18 DIAGNOSIS — J209 Acute bronchitis, unspecified: Principal | ICD-10-CM | POA: Diagnosis present

## 2013-05-18 DIAGNOSIS — R0902 Hypoxemia: Secondary | ICD-10-CM

## 2013-05-18 DIAGNOSIS — F17200 Nicotine dependence, unspecified, uncomplicated: Secondary | ICD-10-CM | POA: Insufficient documentation

## 2013-05-18 DIAGNOSIS — R059 Cough, unspecified: Secondary | ICD-10-CM | POA: Insufficient documentation

## 2013-05-18 DIAGNOSIS — D72829 Elevated white blood cell count, unspecified: Secondary | ICD-10-CM

## 2013-05-18 DIAGNOSIS — Z79899 Other long term (current) drug therapy: Secondary | ICD-10-CM | POA: Insufficient documentation

## 2013-05-18 DIAGNOSIS — I426 Alcoholic cardiomyopathy: Secondary | ICD-10-CM | POA: Insufficient documentation

## 2013-05-18 LAB — URINALYSIS, ROUTINE W REFLEX MICROSCOPIC
Bilirubin Urine: NEGATIVE
Ketones, ur: NEGATIVE mg/dL
Leukocytes, UA: NEGATIVE
Nitrite: NEGATIVE
Urobilinogen, UA: 1 mg/dL (ref 0.0–1.0)
pH: 6 (ref 5.0–8.0)

## 2013-05-18 LAB — BASIC METABOLIC PANEL WITH GFR
BUN: 16 mg/dL (ref 6–23)
CO2: 30 meq/L (ref 19–32)
Calcium: 9.1 mg/dL (ref 8.4–10.5)
Chloride: 99 meq/L (ref 96–112)
Creatinine, Ser: 0.86 mg/dL (ref 0.50–1.35)
GFR calc Af Amer: >90 mL/min (ref >90)
GFR calc non Af Amer: 89 mL/min — ABNORMAL LOW (ref >90)
Glucose, Bld: 123 mg/dL — ABNORMAL HIGH (ref 70–99)
Potassium: 4 meq/L (ref 3.5–5.1)
Sodium: 141 meq/L (ref 135–145)

## 2013-05-18 LAB — POCT I-STAT 3, ART BLOOD GAS (G3+)
Acid-Base Excess: 3 mmol/L — ABNORMAL HIGH (ref 0.0–2.0)
Bicarbonate: 27.6 meq/L — ABNORMAL HIGH (ref 20.0–24.0)
O2 Saturation: 90 %
Patient temperature: 98.6
TCO2: 29 mmol/L (ref 0–100)
pCO2 arterial: 40.4 mmHg (ref 35.0–45.0)
pH, Arterial: 7.442 (ref 7.350–7.450)
pO2, Arterial: 57 mmHg — ABNORMAL LOW (ref 80.0–100.0)

## 2013-05-18 LAB — CBC WITH DIFFERENTIAL/PLATELET
Basophils Relative: 0 % (ref 0–1)
Eosinophils Absolute: 0.3 10*3/uL (ref 0.0–0.7)
Eosinophils Relative: 1 % (ref 0–5)
Hemoglobin: 18.7 g/dL — ABNORMAL HIGH (ref 13.0–17.0)
Lymphocytes Relative: 4 % — ABNORMAL LOW (ref 12–46)
Monocytes Absolute: 2.4 10*3/uL — ABNORMAL HIGH (ref 0.1–1.0)
Neutrophils Relative %: 86 % — ABNORMAL HIGH (ref 43–77)
Platelets: 293 10*3/uL (ref 150–400)
RBC: 5.6 MIL/uL (ref 4.22–5.81)
WBC: 27 10*3/uL — ABNORMAL HIGH (ref 4.0–10.5)

## 2013-05-18 LAB — URINE MICROSCOPIC-ADD ON

## 2013-05-18 LAB — PRO B NATRIURETIC PEPTIDE: Pro B Natriuretic peptide (BNP): 460.9 pg/mL — ABNORMAL HIGH (ref 0–125)

## 2013-05-18 LAB — PROTIME-INR
INR: 2.99 — ABNORMAL HIGH (ref 0.00–1.49)
Prothrombin Time: 30 s — ABNORMAL HIGH (ref 11.6–15.2)

## 2013-05-18 MED ORDER — CARVEDILOL 12.5 MG PO TABS
12.5000 mg | ORAL_TABLET | Freq: Two times a day (BID) | ORAL | Status: DC
  Administered 2013-05-19: 12.5 mg via ORAL
  Filled 2013-05-18 (×3): qty 1

## 2013-05-18 MED ORDER — LEVOFLOXACIN 750 MG PO TABS
750.0000 mg | ORAL_TABLET | Freq: Once | ORAL | Status: AC
Start: 2013-05-18 — End: 2013-05-18
  Administered 2013-05-18: 750 mg via ORAL
  Filled 2013-05-18: qty 1

## 2013-05-18 MED ORDER — IPRATROPIUM BROMIDE 0.02 % IN SOLN
0.5000 mg | Freq: Once | RESPIRATORY_TRACT | Status: AC
  Administered 2013-05-18: 0.5 mg via RESPIRATORY_TRACT
  Filled 2013-05-18: qty 2.5

## 2013-05-18 MED ORDER — HYDROCOD POLST-CHLORPHEN POLST 10-8 MG/5ML PO LQCR
5.0000 mL | Freq: Two times a day (BID) | ORAL | Status: AC
  Administered 2013-05-18 – 2013-05-19 (×2): 5 mL via ORAL
  Filled 2013-05-18 (×2): qty 5

## 2013-05-18 MED ORDER — BENZONATATE 100 MG PO CAPS
100.0000 mg | ORAL_CAPSULE | Freq: Two times a day (BID) | ORAL | Status: DC
  Administered 2013-05-18 – 2013-05-19 (×2): 100 mg via ORAL
  Filled 2013-05-18 (×3): qty 1

## 2013-05-18 MED ORDER — SODIUM CHLORIDE 0.9 % IV SOLN
INTRAVENOUS | Status: AC
  Administered 2013-05-18: 19:00:00 via INTRAVENOUS

## 2013-05-18 MED ORDER — SODIUM CHLORIDE 0.9 % IJ SOLN
3.0000 mL | Freq: Two times a day (BID) | INTRAMUSCULAR | Status: DC
  Administered 2013-05-19: 3 mL via INTRAVENOUS

## 2013-05-18 MED ORDER — ACETAMINOPHEN 325 MG PO TABS: 650.0000 mg | ORAL_TABLET | Freq: Four times a day (QID) | ORAL | Status: DC | PRN

## 2013-05-18 MED ORDER — METHYLPREDNISOLONE SODIUM SUCC 125 MG IJ SOLR
125.0000 mg | Freq: Once | INTRAMUSCULAR | Status: AC
  Administered 2013-05-18: 125 mg via INTRAVENOUS
  Filled 2013-05-18: qty 2

## 2013-05-18 MED ORDER — FUROSEMIDE 20 MG PO TABS
20.0000 mg | ORAL_TABLET | Freq: Every day | ORAL | Status: DC
  Administered 2013-05-19: 11:00:00 20 mg via ORAL
  Filled 2013-05-18: qty 1

## 2013-05-18 MED ORDER — IPRATROPIUM BROMIDE 0.02 % IN SOLN
0.5000 mg | RESPIRATORY_TRACT | Status: AC
  Administered 2013-05-18 (×2): 0.5 mg via RESPIRATORY_TRACT
  Filled 2013-05-18 (×3): qty 2.5

## 2013-05-18 MED ORDER — ALBUTEROL SULFATE (5 MG/ML) 0.5% IN NEBU
2.5000 mg | INHALATION_SOLUTION | RESPIRATORY_TRACT | Status: DC
  Administered 2013-05-18 – 2013-05-19 (×4): 2.5 mg via RESPIRATORY_TRACT
  Filled 2013-05-18 (×4): qty 0.5

## 2013-05-18 MED ORDER — LISINOPRIL 5 MG PO TABS
5.0000 mg | ORAL_TABLET | Freq: Every day | ORAL | Status: DC
  Administered 2013-05-19: 10:00:00 5 mg via ORAL
  Filled 2013-05-18: qty 1

## 2013-05-18 MED ORDER — ALBUTEROL SULFATE (5 MG/ML) 0.5% IN NEBU
INHALATION_SOLUTION | RESPIRATORY_TRACT | Status: AC
  Filled 2013-05-18: qty 0.5

## 2013-05-18 MED ORDER — ALBUTEROL SULFATE (5 MG/ML) 0.5% IN NEBU
5.0000 mg | INHALATION_SOLUTION | Freq: Once | RESPIRATORY_TRACT | Status: AC
  Administered 2013-05-18: 5 mg via RESPIRATORY_TRACT
  Filled 2013-05-18: qty 1

## 2013-05-18 MED ORDER — HYDROCOD POLST-CHLORPHEN POLST 10-8 MG/5ML PO LQCR: 5.0000 mL | Freq: Two times a day (BID) | ORAL | Status: DC | PRN

## 2013-05-18 MED ORDER — AMIODARONE HCL 200 MG PO TABS
200.0000 mg | ORAL_TABLET | Freq: Every day | ORAL | Status: DC
  Administered 2013-05-19: 11:00:00 200 mg via ORAL
  Filled 2013-05-18: qty 1

## 2013-05-18 MED ORDER — ALBUTEROL SULFATE HFA 108 (90 BASE) MCG/ACT IN AERS
2.0000 | INHALATION_SPRAY | Freq: Four times a day (QID) | RESPIRATORY_TRACT | Status: DC | PRN
  Filled 2013-05-18: qty 6.7

## 2013-05-18 MED ORDER — ACETAMINOPHEN 650 MG RE SUPP: 650.0000 mg | Freq: Four times a day (QID) | RECTAL | Status: DC | PRN

## 2013-05-18 MED ORDER — BENZONATATE 100 MG PO CAPS
100.0000 mg | ORAL_CAPSULE | Freq: Two times a day (BID) | ORAL | Status: DC | PRN
  Filled 2013-05-18: qty 1

## 2013-05-18 NOTE — Progress Notes (Signed)
Pt. Admitted to Unit from ED, pt. Transferred to unit with 2 Liters O2, pt. Showed no signs of distress.

## 2013-05-18 NOTE — ED Notes (Signed)
Pt ambulated around Pod. O2 sats ranged from 88-90% without O2. Pt denied any SOB. HR 85bpm

## 2013-05-18 NOTE — H&P (Signed)
Date: 05/18/2013               Patient Name:  [name] MRN: [id number]  DOB: 08/22/1945 Age / Sex: 67 y.o., male   PCP: Annett Gula, MD         Medical Service: Internal Medicine Teaching Service         Attending Physician: Dr. Doug Sou, MD    First Contact: Dr. Vernell Morgans Pager: [phone]  Second Contact: Dr. Fuller Song Pager: [phone]       After Hours (After 5p/  First Contact Pager: [phone]  weekends / holidays): Second Contact Pager: [phone]   Chief Complaint: cough  History of Present Illness: 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  States that he started coughing 2 weeks ago which was initially dry but now with green sputum production.  Denies shortness of breath, dyspnea on exertion, chest pain, dizziness, abdominal pain, fever but reports headaches when he coughs and feels sweaty at times.  States that he has not been able to sleep well for the past couple of nights do to coughing.  States that his nephew has been having a cough which developed around the same time.  Meds: No current facility-administered medications for this encounter.   Current Outpatient Prescriptions  Medication Sig Dispense Refill  . albuterol (PROVENTIL HFA;VENTOLIN HFA) 108 (90 BASE) MCG/ACT inhaler Inhale 2 puffs into the lungs every 6 (six) hours as needed for wheezing.  1 Inhaler  2  . amiodarone (PACERONE) 200 MG tablet Take 1 tablet (200 mg total) by mouth daily.  30 tablet  5  . carvedilol (COREG) 12.5 MG tablet Take 1 tablet (12.5 mg total) by mouth 2 (two) times daily with a meal.  180 tablet  1  . furosemide (LASIX) 40 MG tablet Take 0.5 tablets (20 mg total) by mouth daily.  30 tablet  1  . lisinopril  (PRINIVIL,ZESTRIL) 5 MG tablet Take 1 tablet (5 mg total) by mouth daily.  30 tablet  5  . warfarin (COUMADIN) 5 MG tablet Take 1/2 tablet Monday/Wednesday/Friday; 1 tablet all other days.  30 tablet  1  . potassium chloride 20 MEQ TBCR Please do not take it until I tell you to.  30 tablet  0    Allergies: Allergies as of 05/18/2013 - Review Complete 05/18/2013  Allergen Reaction Noted  . Codeine Rash 09/30/2010   Past Medical History  Diagnosis Date  . Cardiomyopathy   . Substance abuse     alcohol  . Hypertension   . Atrial flutter      status post ablation on 09/11/2010  . CHF (congestive heart failure)     EF 35% with diffuse hypokinesis  . Atrial fibrillation   . Shortness of breath     "all the time lately" (12/14/2012)  . GERD (gastroesophageal reflux disease)   . Hepatitis     "think I had that once; a long time ago; from dirty needles I think" (12/14/2012)   Past Surgical History  Procedure Laterality Date  . Hemorrhoid surgery  1970's  . Cardiac electrophysiology mapping and ablation  08/2010    /notes 09/07/2010 (12/14/2012)   Family History  Problem Relation Age of Onset  . Diabetes Mother   .  Hypertension Mother   . Cirrhosis Father   . Alcohol abuse Father    History   Social History  . Marital Status: Single    Spouse Name: N/A    Number of Children: N/A  . Years of Education: N/A   Occupational History  . Not on file.   Social History Main Topics  . Smoking status: Current Every Day Smoker -- 0.10 packs/day for 40 years    Types: Cigarettes  . Smokeless tobacco: Never Used     Comment: patient smoked 1-2ppd x 50 years, now smoking approx 4-5 cigs per day x 1 year  . Alcohol Use: 0.0 oz/week     Comment: drinks a beer once in a while for the past year, but has h/o heavy alcohol use for many years before this  . Drug Use: Yes    Special: "Crack" cocaine, Cocaine, Heroin     Comment: 03/20/2013 "been clean for awhile"; endorses using heroin "all my  life" up until about a year ago  . Sexual Activity: Not Currently   Other Topics Concern  . Not on file   Social History Narrative   Moved here from [place]. Lives with common law wife and grandchildren. He is retired from "general labor."    Review of Systems: Constitutional:  Endorses diaphoresis, Denies fever, chills, appetite change and fatigue.  HEENT: Denies congestion, sore throat, rhinorrhea, sneezing  Respiratory: Endorses cough, Denies SOB, DOE, chest tightness, and wheezing.  Cardiovascular: Denies chest pain, palpitations and leg swelling.  Gastrointestinal: Denies nausea, vomiting, abdominal pain, diarrhea, constipation, blood in stool and abdominal distention.  Genitourinary: Denies dysuria  Musculoskeletal: Denies myalgias, back pain  Skin: Denies rash   Neurological: Endorses headache when coughing, Denies dizziness, weakness, light-headedness  Psychiatric/ Behavioral: Endorses poor sleep since coughing     Physical Exam: Blood pressure 117/70, pulse 68, temperature 99 F (37.2 C), temperature source Oral, resp. rate 20, height 5\' 6"  (1.676 m), weight 169 lb 12.8 oz (77.021 kg), SpO2 97.00%. General: Well-developed, well-nourished, in no acute distress; sitting upright on side of bed eating Head: Normocephalic, atraumatic, ruddy complexion Eyes: PERRLA, EOMI Throat: Oropharynx nonerythematous Neck: supple, no carotid Bruits, no JVD appreciated. Lungs: Normal respiratory effort. Few expiratory wheezes bilateral lung feels otherwise Clear to auscultation Heart: normal rate, regular rhythm, normal S1 and S2, no gallop, murmur, or rubs appreciated. Abdomen: BS normoactive. Soft, Nondistended, non-tender Extremities: No pretibial edema, distal pulses intact Neurologic: grossly non-focal, alert and oriented x3, appropriate and cooperative throughout examination.     Lab results: Basic Metabolic Panel:  Recent Labs  16/10/96 0942  NA 141  K 4.0  CL 99  CO2  30  GLUCOSE 123*  BUN 16  CREATININE 0.86  CALCIUM 9.1   CBC:  Recent Labs  05/18/13 0942  WBC 27.0*  NEUTROABS 23.2*  HGB 18.7*  HCT 53.4*  MCV 95.4  PLT 293   Coagulation:  Recent Labs  05/18/13 1116  LABPROT 30.0*  INR 2.99*   Urine Drug Screen: Drugs of Abuse     Component Value Date/Time   LABOPIA POSITIVE* 12/15/2012 1239   COCAINSCRNUR NONE DETECTED 12/15/2012 1239   LABBENZ NONE DETECTED 12/15/2012 1239   AMPHETMU NONE DETECTED 12/15/2012 1239   THCU NONE DETECTED 12/15/2012 1239   LABBARB NONE DETECTED 12/15/2012 1239     Urinalysis:  Recent Labs  05/18/13 1210  COLORURINE AMBER*  LABSPEC 1.012  PHURINE 6.0  GLUCOSEU NEGATIVE  HGBUR SMALL*  BILIRUBINUR NEGATIVE  Lavenia Atlas  NEGATIVE  PROTEINUR NEGATIVE  UROBILINOGEN 1.0  NITRITE NEGATIVE  LEUKOCYTESUR NEGATIVE    Imaging results:  Dg Chest 2 View  05/18/2013   CLINICAL DATA:  Cough and congestion.  EXAM: CHEST  2 VIEW  COMPARISON:  03/20/2013.  FINDINGS: The cardiac silhouette, mediastinal and hilar contours are within normal limits and stable. There are mild chronic bronchitic type interstitial lung changes, likely related to smoking. No focal infiltrates, edema or effusions. The bony thorax is intact. Stable advanced degenerative changes involving the right shoulder.  IMPRESSION: Chronic bronchitic type changes, likely related to smoking.  No focal infiltrates or effusion.   Electronically Signed   By: Loralie Champagne M.D.   On: 05/18/2013 09:46    Other results: EKG: normal sinus rhythm, nonspecific ST and T waves changes, small Q waves in inferior leads.  Assessment & Plan by Problem: Active Problems:   Atrial fibrillation   Cardiomyopathy, alcoholic   Hypertension   Long term (current) use of anticoagulants   Polycythemia   Chronic systolic heart failure  [name] is a 67 yo male presented with persistent cough x 2 weeks in setting of tobacco abuse, atrial fibrillation, systolic  CHF, polycythemia admitted for hypoxia on ambulation.  1. Persistent cough with hypoxia: likely secondary to acute bronchitis given that he normally does not have a cough and this has been going on as a dry cough for 2 weeks which has now become productive of sputum. Given his hx of previous 2ppd cigarette smoking for many years now down to several cigarettes per day cough could be due to mild COPD exacerbation especially with noted hypoxia.  Although his leukocytosis is above his normal level, he appears very comfortable on exam, is afebrile, and hemodynamically stable with oxygen saturation >91% on room air when seated. He denies chest pain and is therapeutic on Warfarin thus Pulmonary Embolism lower on the differential diagnosis. ABG demonstrates primary metabolic alkalosis secondary respiratory alkalosis.  Could be chronic given pO2 in Sept 2014 of 65 mmHg and 57 mmHg today. -Will treat symptomatically with cough suppressants, nebulized bronchodilators, and defer antibiotic therapy for now as most acute bronchitis etiology is viral.   -Given his underlying heart disease  and persistent cough of >14d will check pertussis. -consider antibiotic tx if no improvement or worsening of symptoms -last HIV May 2014--> neg -PFTs as outpt to evaluate lung status   Recent Labs Lab 05/18/13 1336  PHART 7.442  PCO2ART 40.4  PO2ART 57.0*  HCO3 27.6*  TCO2 29  O2SAT 90.0     2. Secondary Polycythemia with leukocytosis: Has not had hematologist work up although referral has been made. Erythropoietin level with normal range Feb 2014 -consider checkong jak2 mutation to evaluate for Polycythemia Vera given elevated granulocytes and hematocrit > 52% (although platelets not elevated) -inquire about pruritis  -will monitor trend -check blood cultures -INR therapeutic   3. Atrial fibrillation, rate-controlled: normal sinus rhythm on exam and EKG, on amiodarone and Warfarin -cont home regimen   Recent  Labs Lab 05/18/13 1116  INR 2.99*     4. Chronic systolic congestive heart failure: without acute exacerbation, no signs of volume overload clinically -BNP pending -cont home lasix 20 mg qd  -monitor potassium as pt has been prescribed K+ but has not started taking it as of yet  5. Hypertension: at goal on home regimen of carvedilol and lisinopril -cont home regimen   Dispo: Disposition is deferred at this time, awaiting improvement of current medical problems. Anticipated  discharge in approximately 1-2 day(s).   The patient does have a current PCP Annett Gula, MD) and does need an [facility] hospital follow-up appointment after discharge.  The patient does not have transportation limitations that hinder transportation to clinic appointments.  Signed: Manuela Schwartz, MD 05/18/2013, 3:56 PM

## 2013-05-18 NOTE — ED Provider Notes (Signed)
Plan of coughing green sputum for the past 2 weeks. Denies shortness of breath. Denies orthopnea. On exam speaks in paragraphs no respiratory distress lungs with diffuse rhonchi  Doug Sou, MD 05/18/13 1105

## 2013-05-18 NOTE — ED Notes (Signed)
PA at bedside.

## 2013-05-18 NOTE — Progress Notes (Signed)
ANTICOAGULATION CONSULT NOTE - Initial Consult  Pharmacy Consult for Warfarin Indication: atrial fibrillation  Allergies  Allergen Reactions  . Codeine Rash    Patient Measurements: Height: 5\' 6"  (167.6 cm) Weight: 166 lb 3.6 oz (75.4 kg) IBW/kg (Calculated) : 63.8  Vital Signs: Temp: 98.3 F (36.8 C) (10/30 1730) Temp src: Oral (10/30 1730) BP: 110/68 mmHg (10/30 1730) Pulse Rate: 66 (10/30 1730)  Labs:  Recent Labs  05/18/13 0942 05/18/13 1116  HGB 18.7*  --   HCT 53.4*  --   PLT 293  --   LABPROT  --  30.0*  INR  --  2.99*  CREATININE 0.86  --     Estimated Creatinine Clearance: 77.3 ml/min (by C-G formula based on Cr of 0.86).   Medical History: Past Medical History  Diagnosis Date  . Cardiomyopathy   . Substance abuse     alcohol  . Hypertension   . Atrial flutter      status post ablation on 09/11/2010  . CHF (congestive heart failure)     EF 35% with diffuse hypokinesis  . Atrial fibrillation   . Shortness of breath     "all the time lately" (12/14/2012)  . GERD (gastroesophageal reflux disease)   . Hepatitis     "think I had that once; a long time ago; from dirty needles I think" (12/14/2012)    Medications:  Coumadin 2.5mg  on Sundays, Mondays, Wednesdays, and Fridays Coumadin 5mg  on Tuesdays, Thursdays, and Saturdays  Assessment: [name]'s INR is within the therapeutic range for atrial fibrillation.  He is taking his Coumadin as prescribed above, and has already taken his dose for today.  Goal of Therapy:  INR 2-3   Plan:  No Coumadin today Daily PT/INR monitoring  Estella Husk, Pharm.D., BCPS, AAHIVP Clinical Pharmacist Phone: [phone] or [phone] 05/18/2013, 5:53 PM

## 2013-05-18 NOTE — ED Notes (Signed)
Pt placed on 2L O2 due to sats below 90% even after position changes. Pt denies SOB

## 2013-05-18 NOTE — ED Provider Notes (Signed)
CSN: [id number]     Arrival date & time 05/18/13  0906 History   First MD Initiated Contact with Patient 05/18/13 [phone]     Chief Complaint  Patient presents with  . Cough   (Consider location/radiation/quality/duration/timing/severity/associated sxs/prior Treatment) HPI  Demarqus Jocson is a 67 y.o. male with past medical history significant for A. fib (chronically anticoagulated with Coumadin), cardiomyopathy with CHF and ejection fraction of 35%. Patient states that he recently quit smoking. Has a long history of tobacco abuse. Patient is complaining of dry cough onset 2 weeks ago. Patient denies any chest pain, peripheral edema, shortness of breath above his baseline, fever, nausea vomiting, change in bowel or bladder habits or by mouth intake.  Past Medical History  Diagnosis Date  . Cardiomyopathy   . Substance abuse     alcohol  . Hypertension   . Atrial flutter      status post ablation on 09/11/2010  . CHF (congestive heart failure)     EF 35% with diffuse hypokinesis  . Atrial fibrillation   . Shortness of breath     "all the time lately" (12/14/2012)  . GERD (gastroesophageal reflux disease)   . Hepatitis     "think I had that once; a long time ago; from dirty needles I think" (12/14/2012)   Past Surgical History  Procedure Laterality Date  . Hemorrhoid surgery  1970's  . Cardiac electrophysiology mapping and ablation  08/2010    /notes 09/07/2010 (12/14/2012)   Family History  Problem Relation Age of Onset  . Diabetes Mother   . Hypertension Mother   . Cirrhosis Father   . Alcohol abuse Father    History  Substance Use Topics  . Smoking status: Current Every Day Smoker -- 0.10 packs/day for 40 years    Types: Cigarettes  . Smokeless tobacco: Never Used     Comment: patient smoked 1-2ppd x 50 years, now smoking approx 4-5 cigs per day x 1 year  . Alcohol Use: 0.0 oz/week     Comment: drinks a beer once in a while for the past year, but has h/o heavy alcohol use  for many years before this    Review of Systems 10 systems reviewed and found to be negative, except as noted in the HPI   Allergies  Codeine  Home Medications   Current Outpatient Rx  Name  Route  Sig  Dispense  Refill  . albuterol (PROVENTIL HFA;VENTOLIN HFA) 108 (90 BASE) MCG/ACT inhaler   Inhalation   Inhale 2 puffs into the lungs every 6 (six) hours as needed for wheezing.   1 Inhaler   2   . amiodarone (PACERONE) 200 MG tablet   Oral   Take 1 tablet (200 mg total) by mouth daily.   30 tablet   5   . carvedilol (COREG) 12.5 MG tablet   Oral   Take 1 tablet (12.5 mg total) by mouth 2 (two) times daily with a meal.   180 tablet   1   . furosemide (LASIX) 40 MG tablet   Oral   Take 0.5 tablets (20 mg total) by mouth daily.   30 tablet   1   . lisinopril (PRINIVIL,ZESTRIL) 5 MG tablet   Oral   Take 1 tablet (5 mg total) by mouth daily.   30 tablet   5   . warfarin (COUMADIN) 5 MG tablet      Take 1/2 tablet Monday/Wednesday/Friday; 1 tablet all other days.   30 tablet  1   . potassium chloride 20 MEQ TBCR      Please do not take it until I tell you to.   30 tablet   0    BP 116/71  Pulse 76  Temp(Src) 99 F (37.2 C) (Oral)  Resp 25  Ht 5\' 6"  (1.676 m)  Wt 169 lb 12.8 oz (77.021 kg)  BMI 27.42 kg/m2  SpO2 99% Physical Exam  Nursing note and vitals reviewed. Constitutional: He is oriented to person, place, and time. He appears well-developed and well-nourished. No distress.  HENT:  Head: Normocephalic and atraumatic.  Mouth/Throat: Oropharynx is clear and moist.  Eyes: Conjunctivae and EOM are normal. Pupils are equal, round, and reactive to light.  Neck: Normal range of motion.  Cardiovascular: Normal rate, regular rhythm and intact distal pulses.   Pulmonary/Chest: Effort normal. No stridor. No respiratory distress. He has wheezes. He has no rales. He exhibits no tenderness.  Transmitted upper airway sounds, scattered diffuse wheezing,  expiration is prolonged.  Abdominal: Soft. Bowel sounds are normal. He exhibits no distension and no mass. There is no tenderness. There is no rebound and no guarding.  Musculoskeletal: Normal range of motion. He exhibits no edema and no tenderness.  Neurological: He is alert and oriented to person, place, and time.  Psychiatric: He has a normal mood and affect.    ED Course  Procedures (including critical care time) Labs Review Labs Reviewed  CBC WITH DIFFERENTIAL - Abnormal; Notable for the following:    WBC 27.0 (*)    Hemoglobin 18.7 (*)    HCT 53.4 (*)    Neutrophils Relative % 86 (*)    Lymphocytes Relative 4 (*)    Neutro Abs 23.2 (*)    Monocytes Absolute 2.4 (*)    All other components within normal limits  BASIC METABOLIC PANEL - Abnormal; Notable for the following:    Glucose, Bld 123 (*)    GFR calc non Af Amer 89 (*)    All other components within normal limits  PROTIME-INR - Abnormal; Notable for the following:    Prothrombin Time 30.0 (*)    INR 2.99 (*)    All other components within normal limits  URINALYSIS, ROUTINE W REFLEX MICROSCOPIC - Abnormal; Notable for the following:    Color, Urine AMBER (*)    Hgb urine dipstick SMALL (*)    All other components within normal limits  URINE MICROSCOPIC-ADD ON - Abnormal; Notable for the following:    Casts HYALINE CASTS (*)    All other components within normal limits  POCT I-STAT 3, BLOOD GAS (G3+) - Abnormal; Notable for the following:    pO2, Arterial 57.0 (*)    Bicarbonate 27.6 (*)    Acid-Base Excess 3.0 (*)    All other components within normal limits  BLOOD GAS, ARTERIAL   Imaging Review Dg Chest 2 View  05/18/2013   CLINICAL DATA:  Cough and congestion.  EXAM: CHEST  2 VIEW  COMPARISON:  03/20/2013.  FINDINGS: The cardiac silhouette, mediastinal and hilar contours are within normal limits and stable. There are mild chronic bronchitic type interstitial lung changes, likely related to smoking. No focal  infiltrates, edema or effusions. The bony thorax is intact. Stable advanced degenerative changes involving the right shoulder.  IMPRESSION: Chronic bronchitic type changes, likely related to smoking.  No focal infiltrates or effusion.   Electronically Signed   By: Loralie Champagne M.D.   On: 05/18/2013 09:46    EKG Interpretation  None        Date: 05/18/2013  Rate: 91  Rhythm: normal sinus rhythm  QRS Axis: normal  Intervals: normal  ST/T Wave abnormalities: normal  Conduction Disutrbances:none  Narrative Interpretation:   Old EKG Reviewed: none available    MDM   1. Hypoxia   2. Cough   3. Leukocytosis   4. Tobacco use disorder     Filed Vitals:   05/18/13 1015 05/18/13 1215 05/18/13 1306 05/18/13 1415  BP: 110/67 122/63 91/53 116/71  Pulse: 73 63 69 76  Temp:   99 F (37.2 C)   TempSrc:   Oral   Resp: 28  25   Height:      Weight:      SpO2: 91% 92% 92% 99%     Michaiah Holsopple is a 67 y.o. male complaining of dry cough worsening over the course of the last 2 weeks. Patient is wheezing significantly, DuoNeb, Solu-Medrol chest x-ray ordered. Because patient's is a active smoker, and wheezing I am treating him a COPD exacerbation. Chest x-ray shows chronic bronchitic changes with no focal infiltrates or effusions. Patient reports significant subjective improvement after nebulizer treatment. He is still wheezing after nebulizer treatment however he has refused another treatment. I will start patient on Levaquin for presumed COPD exacerbation. The patient and relates with pulse ox his sats dropped to 88%. ABG is ordered.  ABG shows a pO2 of 57. Patient will need to be admitted for hypoxia. He is a internal medicine patient, Dr. Bosie Clos consult it she will come to evaluate and admit him.  Medications  methylPREDNISolone sodium succinate (SOLU-MEDROL) 125 mg/2 mL injection 125 mg (125 mg Intravenous Given 05/18/13 1038)  albuterol (PROVENTIL) (5 MG/ML) 0.5% nebulizer  solution 5 mg (5 mg Nebulization Given 05/18/13 1023)  ipratropium (ATROVENT) nebulizer solution 0.5 mg (0.5 mg Nebulization Given 05/18/13 1024)  levofloxacin (LEVAQUIN) tablet 750 mg (750 mg Oral Given 05/18/13 1156)    Note: Portions of this report may have been transcribed using voice recognition software. Every effort was made to ensure accuracy; however, inadvertent computerized transcription errors may be present      Wynetta Emery, PA-C 05/18/13 1423

## 2013-05-18 NOTE — ED Notes (Signed)
Pt with cough x 2 weeks.  Denies chest pain or sob.  States unable to sleep d/t coughing.  Wheezing noted on auscultation.

## 2013-05-18 NOTE — Progress Notes (Signed)
Co-signed for LaTisha Teasley RN/BSN for assessments, IV assessments, medication administration, care plans, patient education, progress notes, and vital signs. Carrieanne Kleen M, RN/BSN 

## 2013-05-18 NOTE — ED Notes (Signed)
PA made aware that pt O2 will decrease to 88% when laying in bed. Will increase to 98% when sitting

## 2013-05-18 NOTE — ED Provider Notes (Signed)
Medical screening examination/treatment/procedure(s) were conducted as a shared visit with non-physician practitioner(s) and myself.  I personally evaluated the patient during the encounter.  EKG Interpretation   None        Doug Sou, MD 05/18/13 [phone]

## 2013-05-18 NOTE — Progress Notes (Signed)
Pt. Stable and currently showing no signs of distress and denied any pain at this time. 

## 2013-05-19 DIAGNOSIS — J440 Chronic obstructive pulmonary disease with acute lower respiratory infection: Secondary | ICD-10-CM | POA: Diagnosis not present

## 2013-05-19 DIAGNOSIS — D45 Polycythemia vera: Secondary | ICD-10-CM

## 2013-05-19 DIAGNOSIS — J441 Chronic obstructive pulmonary disease with (acute) exacerbation: Secondary | ICD-10-CM

## 2013-05-19 DIAGNOSIS — D72829 Elevated white blood cell count, unspecified: Secondary | ICD-10-CM

## 2013-05-19 LAB — MAGNESIUM: Magnesium: 2 mg/dL (ref 1.5–2.5)

## 2013-05-19 LAB — CBC
Hemoglobin: 16.8 g/dL (ref 13.0–17.0)
MCHC: 34.4 g/dL (ref 30.0–36.0)
Platelets: 267 10*3/uL (ref 150–400)
RBC: 5.13 MIL/uL (ref 4.22–5.81)

## 2013-05-19 LAB — BASIC METABOLIC PANEL
CO2: 28 mEq/L (ref 19–32)
GFR calc non Af Amer: >90 mL/min (ref >90)
Glucose, Bld: 148 mg/dL — ABNORMAL HIGH (ref 70–99)
Potassium: 4.1 mEq/L (ref 3.5–5.1)
Sodium: 140 mEq/L (ref 135–145)

## 2013-05-19 MED ORDER — BENZONATATE 100 MG PO CAPS: 100.0000 mg | ORAL_CAPSULE | Freq: Two times a day (BID) | ORAL | Status: DC

## 2013-05-19 MED ORDER — PREDNISONE 20 MG PO TABS: 20.0000 mg | ORAL_TABLET | Freq: Every day | ORAL | Status: DC

## 2013-05-19 MED ORDER — BUDESONIDE 90 MCG/ACT IN AEPB: 2.0000 | INHALATION_SPRAY | Freq: Two times a day (BID) | RESPIRATORY_TRACT | Status: DC

## 2013-05-19 MED ORDER — DOXYCYCLINE HYCLATE 100 MG PO TABS: 100.0000 mg | ORAL_TABLET | Freq: Two times a day (BID) | ORAL | Status: DC

## 2013-05-19 NOTE — Discharge Summary (Signed)
Name: [name] MRN: [id number] DOB: March 22, 1946 67 y.o. PCP: Annett Gula, MD  Date of Admission: 05/18/2013  9:33 AM Date of Discharge: 05/19/2013 Attending Physician: Ginnie Smart, MD  Discharge Diagnosis: Principal Problem:   Acute bronchitis Active Problems:   Atrial fibrillation   Cardiomyopathy, alcoholic   Hypertension   Long term (current) use of anticoagulants   Polycythemia   Chronic systolic heart failure   Hypoxia  Discharge Medications:   Medication List    STOP taking these medications       Potassium Chloride ER 20 MEQ Tbcr      TAKE these medications       albuterol 108 (90 BASE) MCG/ACT inhaler  Commonly known as:  PROVENTIL HFA;VENTOLIN HFA  Inhale 2 puffs into the lungs every 6 (six) hours as needed for wheezing.     amiodarone 200 MG tablet  Commonly known as:  PACERONE  Take 1 tablet (200 mg total) by mouth daily.     benzonatate 100 MG capsule  Commonly known as:  TESSALON  Take 1 capsule (100 mg total) by mouth 2 (two) times daily.     Budesonide 90 MCG/ACT inhaler  Commonly known as:  PULMICORT FLEXHALER  Inhale 2 puffs into the lungs 2 (two) times daily.     carvedilol 12.5 MG tablet  Commonly known as:  COREG  Take 1 tablet (12.5 mg total) by mouth 2 (two) times daily with a meal.     doxycycline 100 MG tablet  Commonly known as:  VIBRA-TABS  Take 1 tablet (100 mg total) by mouth 2 (two) times daily.     furosemide 40 MG tablet  Commonly known as:  LASIX  Take 0.5 tablets (20 mg total) by mouth daily.     lisinopril 5 MG tablet  Commonly known as:  PRINIVIL,ZESTRIL  Take 1 tablet (5 mg total) by mouth daily.     predniSONE 20 MG tablet  Commonly known as:  DELTASONE  Take 1 tablet (20 mg total) by mouth daily.     warfarin 5 MG tablet  Commonly known as:  COUMADIN  Take 2.5-5 mg by mouth daily. Take 1/2 tablet (2.5mg ) on Sunday, Monday, Wednesday, and Friday.  Take 1 tablet (5mg ) on Tuesday, Thursday,  and Saturday        Disposition and follow-up:   [name] was discharged from [facility] in Good condition.  At the hospital follow up visit please address:  1.  Continued cough, referral to hematology for chronic polycythemia/leukocytosis?  2.  Labs / imaging needed at time of follow-up: CBC, PT/INR  3.  Pending labs/ test needing follow-up: PFT's  Follow-up Appointments: Follow-up Information   Follow up with Kristie Cowman, MD On 06/01/2013. (3:00pm)    Specialty:  Internal Medicine   Contact information:   [address] [place] [address] [phone]       Discharge Instructions: Discharge Orders   Future Appointments Provider Department Dept Phone   06/01/2013 3:00 PM Manuela Schwartz, MD [facility] [phone]   06/05/2013 9:30 AM [facility] [phone]   Future Orders Complete By Expires   (HEART FAILURE PATIENTS) Call MD:  Anytime you have any of the following symptoms: 1) 3 pound weight gain in 24 hours or 5 pounds in 1 week 2) shortness of breath, with or without a dry hacking cough 3) swelling in the hands, feet or stomach 4) if  you have to sleep on extra pillows at night in order to breathe.  As directed    Call MD for:  difficulty breathing, headache or visual disturbances  As directed    Diet - low sodium heart healthy  As directed    Increase activity slowly  As directed       Consultations:  None  Procedures Performed:  Dg Chest 2 View  05/18/2013   CLINICAL DATA:  Cough and congestion.  EXAM: CHEST  2 VIEW  COMPARISON:  03/20/2013.  FINDINGS: The cardiac silhouette, mediastinal and hilar contours are within normal limits and stable. There are mild chronic bronchitic type interstitial lung changes, likely related to smoking. No focal infiltrates, edema or effusions. The bony thorax is intact. Stable advanced degenerative changes  involving the right shoulder.  IMPRESSION: Chronic bronchitic type changes, likely related to smoking.  No focal infiltrates or effusion.   Electronically Signed   By: Loralie Champagne M.D.   On: 05/18/2013 09:46    2D Echo: None  Cardiac Cath: None  Admission HPI: [name] is a 67 yo with hx significant for chronic systolic CHF, alcoholic cardiomyopathy (EF 35%) with reported last ETOH a year ago, atrial fibrillation on Warfarin, hypertension, chronic polycythemia with leukocytosis and tobacco abuse who presents to the ED with complaints of cough for past two weeks. States that he started coughing 2 weeks ago which was initially dry but now with green sputum production. Denies shortness of breath, dyspnea on exertion, chest pain, dizziness, abdominal pain, fever but reports headaches when he coughs and feels sweaty at times. States that he has not been able to sleep well for the past couple of nights do to coughing. States that his nephew has been having a cough which developed around the same time.   Hospital Course by problem list: Principal Problem:   Acute bronchitis Active Problems:   Atrial fibrillation   Cardiomyopathy, alcoholic   Hypertension   Long term (current) use of anticoagulants   Polycythemia   Chronic systolic heart failure   Hypoxia   1. Acute bronchitis with hypoxia: likely secondary to acute bronchitis given that he normally does not have a cough and this has been going on as a dry cough for 2 weeks which has now become productive of sputum. Given his hx of previous 2ppd cigarette smoking for many years now down to several cigarettes per day cough could be due to mild COPD exacerbation especially with noted hypoxia. Although his leukocytosis is above his normal level, he appears very comfortable on exam, is afebrile, and hemodynamically stable with oxygen saturation >91% on room air when seated. He denies chest pain and is therapeutic on warfarin thus pulmonary  embolism was lower on the differential diagnosis. ABG demonstrated primary metabolic alkalosis secondary respiratory alkalosis. Could be chronic given pO2 in Sept 2014 of 65 mmHg and 57 mmHg upon admission.  We treated symptomatically with cough suppressants and nebulized bronchodilators.  BC were negative.  Pertussis IgM was negative.  HIV May 2014 was negative.  Upon discharge, his O2 sat was ~92% on room air.  He was discharged with a short course of doxycycline and prednisone.  Pt is on an ACE-i which could also contribute to cough but would expect a more chronic cough.  Pt should get PFTs as outpt to evaluate pulmonary function.    2. Secondary Polycythemia with leukocytosis: Polycythemia may be secondary to chronic hypoxia.  He has not had hematologist work up although referral has  been made. Erythropoietin level with normal range Feb 2014.    3. Atrial fibrillation, rate-controlled: Stable. Normal sinus rhythm on exam and EKG, on amiodarone and warfarin.  INR therapeutic.  Continued home regimen.    Recent Labs  Lab  05/18/13 1116   INR  2.99*    4. Chronic systolic congestive heart failure: Stable without acute exacerbation, no signs of volume overload clinically.  Pro BNP 460.9 which is decreased from previous values.  We continued lasix 20mg  daily.     5. Hypertension: Stable. At goal on home regimen of carvedilol and lisinopril.  Continued home regimen.   Discharge Vitals:   BP 105/66  Pulse 64  Temp(Src) 97.6 F (36.4 C) (Oral)  Resp 20  Ht 5\' 6"  (1.676 m)  Wt 76.159 kg (167 lb 14.4 oz)  BMI 27.11 kg/m2  SpO2 96%  Discharge Labs:  Results for orders placed during the hospital encounter of 05/18/13 (from the past 24 hour(s))  POCT I-STAT 3, BLOOD GAS (G3+)     Status: Abnormal   Collection Time    05/18/13  1:36 PM      Result Value Range   pH, Arterial 7.442  7.350 - 7.450   pCO2 arterial 40.4  35.0 - 45.0 mmHg   pO2, Arterial 57.0 (*) 80.0 - 100.0 mmHg   Bicarbonate  27.6 (*) 20.0 - 24.0 mEq/L   TCO2 29  0 - 100 mmol/L   O2 Saturation 90.0     Acid-Base Excess 3.0 (*) 0.0 - 2.0 mmol/L   Patient temperature 98.6 F     Collection site RADIAL, ALLEN'S TEST ACCEPTABLE     Drawn by RT     Sample type ARTERIAL    CULTURE, BLOOD (ROUTINE X 2)     Status: None   Collection Time    05/18/13  4:15 PM      Result Value Range   Specimen Description BLOOD RIGHT ANTECUBITAL     Special Requests BOTTLES DRAWN AEROBIC ONLY 10CC     Culture  Setup Time       Value: 05/18/2013 21:29     Performed at [facility]   Culture       Value:        BLOOD CULTURE RECEIVED NO GROWTH TO DATE CULTURE WILL BE HELD FOR 5 DAYS BEFORE ISSUING A FINAL NEGATIVE REPORT     Performed at [facility]   Report Status PENDING    PRO B NATRIURETIC PEPTIDE     Status: Abnormal   Collection Time    05/18/13  4:16 PM      Result Value Range   Pro B Natriuretic peptide (BNP) 460.9 (*) 0 - 125 pg/mL  CULTURE, BLOOD (ROUTINE X 2)     Status: None   Collection Time    05/18/13  4:20 PM      Result Value Range   Specimen Description BLOOD HAND RIGHT     Special Requests BOTTLES DRAWN AEROBIC ONLY 7CC     Culture  Setup Time       Value: 05/18/2013 21:29     Performed at [facility]   Culture       Value:        BLOOD CULTURE RECEIVED NO GROWTH TO DATE CULTURE WILL BE HELD FOR 5 DAYS BEFORE ISSUING A FINAL NEGATIVE REPORT     Performed at [facility]   Report Status PENDING    CBC  Status: Abnormal   Collection Time    05/19/13  4:35 AM      Result Value Range   WBC 25.0 (*) 4.0 - 10.5 K/uL   RBC 5.13  4.22 - 5.81 MIL/uL   Hemoglobin 16.8  13.0 - 17.0 g/dL   HCT 40.9  81.1 - 91.4 %   MCV 95.3  78.0 - 100.0 fL   MCH 32.7  26.0 - 34.0 pg   MCHC 34.4  30.0 - 36.0 g/dL   RDW 78.2  95.6 - 21.3 %   Platelets 267  150 - 400 K/uL  BASIC METABOLIC PANEL     Status: Abnormal   Collection Time    05/19/13  4:35 AM      Result Value Range   Sodium  140  135 - 145 mEq/L   Potassium 4.1  3.5 - 5.1 mEq/L   Chloride 102  96 - 112 mEq/L   CO2 28  19 - 32 mEq/L   Glucose, Bld 148 (*) 70 - 99 mg/dL   BUN 20  6 - 23 mg/dL   Creatinine, Ser 0.86  0.50 - 1.35 mg/dL   Calcium 9.1  8.4 - 57.8 mg/dL   GFR calc non Af Amer >90  >90 mL/min   GFR calc Af Amer >90  >90 mL/min  MAGNESIUM     Status: None   Collection Time    05/19/13  6:53 AM      Result Value Range   Magnesium 2.0  1.5 - 2.5 mg/dL    Signed: Boykin Peek, MD 05/19/2013, 12:55 PM   Time Spent on Discharge: 35 minutes Services Ordered on Discharge: None Equipment Ordered on Discharge: None

## 2013-05-19 NOTE — H&P (Addendum)
  Date: 05/19/2013  Patient name: [name]  Medical record number: [id number]  Date of birth: 10/28/1945   I have seen and evaluated Ryan Cull and discussed their care with the Residency Team.   Assessment and Plan: I have seen and evaluated the patient as outlined above. I agree with the formulated Assessment and Plan as detailed in the residents' admission note, with the following changes:   67 yo M with hx of COPD comes to ED with 2 weeks of increasing cough. In ED was found to desaturate on room air and was admitted for further care. He does not have O2 at home.  He was started on IV steroids, nebulizers.  This AM he is ambulating without desaturation. He states he feels back to his baseline. He dose not have sputum production but has had a chronic cough which has increased. He has been afebrile in the hospital.   Filed Vitals:   05/19/13 1100  BP: 105/66  Pulse: 64  Temp: 97.6  Resp: 96% RA  Eyes: EOMI, PERRL Mouth: no thrush Neck: nt, no LAN Chest: wheezes with good airflow.  CV: RRR ABd: BS+, soft, nontender.  Extr: no edema  Labs of note:  CXR: Chronic bronchitic type changes, likely related to smoking. No focal infiltrates or effusion. ECG: NSR 69, ? LVH  WBC 25.0  BNP 460.9  A/P COPD exacerbation Leukocytosis, polycythemia  will continue him on steroid taper.  Continue inhalers, start steroid inhaler.  His ECG does not show ischemic changes.  Probable d/c home today with f/i in IM clinic next week.   Apparently his Leukocytosis/Polycythemia has been chronic. He will need to f/u with Heme (has missed prev appt).   HIV (-) in May 2014.   Ginnie Smart, MD 10/31/201411:15 AM

## 2013-05-19 NOTE — Progress Notes (Signed)
Patient evaluated for community based chronic disease management services with [facility] Care Management Program as a benefit of patient's [employer]. Spoke with patient at bedside to explain [facility] Care Management services.  Patient was receptive to information but feels he can self manage.  Left contact information and [facility] literature at bedside. Made Inpatient Case Manager aware that [facility] Care Management following. Of note, [facility] Care Management services does not replace or interfere with any services that are arranged by inpatient case management or social work.  For additional questions or referrals please contact Anibal Henderson BSN RN [name] Liaison at [phone].

## 2013-05-19 NOTE — Progress Notes (Signed)
1457 gischarge instructions given to pt . Verbalized understanding.

## 2013-05-19 NOTE — Progress Notes (Signed)
Pt having sinus arrythmia, pt stable, MD notified and ordered labs and EKG, will continue to monitor

## 2013-05-19 NOTE — Progress Notes (Signed)
Pt a/o, no c/o pain, pt resting comfortable, pt stable, will continue to monitor

## 2013-05-19 NOTE — Progress Notes (Signed)
Subjective: Pt had reported sinus arrythmia overnight and was asymptomatic. VSS overnight with no fever or significant hypoxia. EKG reviewed NSR and BMet and Mg levels were normal. Pt feels overall improved from yesterday. requiring 2L O2 with good sats in 96-98%s.   Objective: Vital signs in last 24 hours: Filed Vitals:   05/18/13 1730 05/18/13 2118 05/19/13 0532 05/19/13 0735  BP: 110/68 108/57 112/67   Pulse: 66 71 74   Temp: 98.3 F (36.8 C) 98 F (36.7 C) 97.1 F (36.2 C)   TempSrc: Oral Oral Oral   Resp: 18 20 20    Height: 5\' 6"  (1.676 m)     Weight: 166 lb 3.6 oz (75.4 kg)  167 lb 14.4 oz (76.159 kg)   SpO2: 94% 94% 98% 96%   Weight change:   Intake/Output Summary (Last 24 hours) at 05/19/13 0753 Last data filed at 05/19/13 0530  Gross per 24 hour  Intake    240 ml  Output   1375 ml  Net  -1135 ml   General: resting in bed, NAD, receiving nebulizer treatment HEENT: PERRL, EOMI, no scleral icterus Cardiac: RRR, no rubs, murmurs or gallops Pulm: clear to auscultation bilaterally, no wheezes or rhonchi, moving normal volumes of air Abd: soft, nontender, nondistended, BS present Ext: warm and well perfused, no pedal edema Neuro: alert and oriented X3, cranial nerves II-XII grossly intact  Lab Results: Basic Metabolic Panel:  Recent Labs Lab 05/18/13 0942 05/19/13 0435  NA 141 140  K 4.0 4.1  CL 99 102  CO2 30 28  GLUCOSE 123* 148*  BUN 16 20  CREATININE 0.86 0.72  CALCIUM 9.1 9.1   CBC:  Recent Labs Lab 05/18/13 0942 05/19/13 0435  WBC 27.0* 25.0*  NEUTROABS 23.2*  --   HGB 18.7* 16.8  HCT 53.4* 48.9  MCV 95.4 95.3  PLT 293 267   BNP:  Recent Labs Lab 05/18/13 1616  PROBNP 460.9*   Coagulation:  Recent Labs Lab 05/18/13 1116  LABPROT 30.0*  INR 2.99*   Urine Drug Screen: Drugs of Abuse     Component Value Date/Time   LABOPIA POSITIVE* 12/15/2012 1239   COCAINSCRNUR NONE DETECTED 12/15/2012 1239   LABBENZ NONE DETECTED  12/15/2012 1239   AMPHETMU NONE DETECTED 12/15/2012 1239   THCU NONE DETECTED 12/15/2012 1239   LABBARB NONE DETECTED 12/15/2012 1239    Urinalysis:  Recent Labs Lab 05/18/13 1210  COLORURINE AMBER*  LABSPEC 1.012  PHURINE 6.0  GLUCOSEU NEGATIVE  HGBUR SMALL*  BILIRUBINUR NEGATIVE  KETONESUR NEGATIVE  PROTEINUR NEGATIVE  UROBILINOGEN 1.0  NITRITE NEGATIVE  LEUKOCYTESUR NEGATIVE   EKG: normal sinus rhythm, nonspecific ST and T waves changes, small Q waves in inferior leads all unchanged from admission EKG  Studies/Results: Dg Chest 2 View  05/18/2013   CLINICAL DATA:  Cough and congestion.  EXAM: CHEST  2 VIEW  COMPARISON:  03/20/2013.  FINDINGS: The cardiac silhouette, mediastinal and hilar contours are within normal limits and stable. There are mild chronic bronchitic type interstitial lung changes, likely related to smoking. No focal infiltrates, edema or effusions. The bony thorax is intact. Stable advanced degenerative changes involving the right shoulder.  IMPRESSION: Chronic bronchitic type changes, likely related to smoking.  No focal infiltrates or effusion.   Electronically Signed   By: Loralie Champagne M.D.   On: 05/18/2013 09:46   Medications: I have reviewed the patient's current medications. Scheduled Meds: . ipratropium  0.5 mg Nebulization Q4H   And  .  albuterol  2.5 mg Nebulization Q4H  . amiodarone  200 mg Oral Daily  . benzonatate  100 mg Oral BID  . carvedilol  12.5 mg Oral BID WC  . chlorpheniramine-HYDROcodone  5 mL Oral Q12H  . furosemide  20 mg Oral Daily  . lisinopril  5 mg Oral Daily  . sodium chloride  3 mL Intravenous Q12H   Continuous Infusions:  PRN Meds:.acetaminophen, acetaminophen, albuterol, [START ON 05/20/2013] benzonatate, chlorpheniramine-HYDROcodone Assessment/Plan: 1. Persistent cough with hypoxia 2/2 presumed COPD exacerbation vs chronic bronchitis: Pt appears to have chronic cough at baseline never had PFTs performed. Admission ABG  demonstrated primary metabolic alkalosis secondary respiratory alkalosis. CXR with chronic bronchitic changes. Pt VSS overnight on 2L O2. BCx NGTD and still pending, pertussis still pending but less likely as pt is afebrile and improving. Pt did receive IV Solumedrol and levaquin.  -treat as COPD exacerbation with levofloaxcin, steriod taper, and albuterol/pulmicort inhalers -cont symptomatic management with cough suppressants, nebulized bronchodilators, and continue to defer antibiotic therapy unless decompensates -PFTs as outpt to evaluate for possible underlying lung disease -ambulate pt with O2 monitoring for desaturation  2. Secondary Polycythemia with leukocytosis- chronic: Pt has had elevated Hgb in the past 20.7, HCT 58, and WBC 20.6 in 9/14 was referred to Heme/Onc but has not undergone evaluation yet. Erythropoietin level with normal range Feb 2014.    -will monitor trend  -outpt evaluation  3. Atrial fibrillation, rate-controlled: normal sinus rhythm on exam and EKG, on amiodarone and Warfarin. Repeat EKG NSR and Bmet/Mg levels normal.  -cont home regimen   4. Chronic systolic congestive heart failure: without acute exacerbation, no signs of volume overload clinically. ProBNP 460 which is significantlly lower than previous 3-1000s.    -cont home lasix 20 mg qd   5. Hypertension: at goal on home regimen of carvedilol and lisinopril  -cont home regimen  Dispo: Disposition is deferred at this time, awaiting improvement of current medical problems.  Anticipated discharge in approximately 1 day(s).   The patient does have a current PCP Annett Gula, MD) and does need an [facility] hospital follow-up appointment after discharge.  The patient does not have transportation limitations that hinder transportation to clinic appointments.  .Services Needed at time of discharge: Y = Yes, Blank = No PT:   OT:   RN:   Equipment:   Other:     LOS: 1 day   Christen Bame, MD 05/19/2013, 7:53  AM Pgr: [phone]

## 2013-05-23 LAB — BORDETELLA PERTUSSIS ANTIBODY
B pertussis IgA Ab, Quant: 1.3 U/mL — ABNORMAL HIGH (ref <=1.1)
B pertussis IgM Ab, Quant: 0.1 U/mL (ref <=1.1)

## 2013-05-24 LAB — CULTURE, BLOOD (ROUTINE X 2)
Culture: NO GROWTH
Culture: NO GROWTH

## 2013-05-24 LAB — BORDETELLA PERTUSSIS, IGA BY IB
B pertussis, IgA: POSITIVE
B. pertussis IgA IBA PT: POSITIVE

## 2013-05-30 ENCOUNTER — Telehealth: Payer: Self-pay | Admitting: Licensed Clinical Social Worker

## 2013-05-30 NOTE — Telephone Encounter (Signed)
CSW placed call to [name] to follow up for transition of care from hospital discharge.  Family member answered the phone, pt is not home at this time.  CSW will return call at a later time.

## 2013-06-01 ENCOUNTER — Encounter: Payer: Self-pay | Admitting: Internal Medicine

## 2013-06-01 ENCOUNTER — Telehealth: Payer: Self-pay | Admitting: Hematology and Oncology

## 2013-06-01 ENCOUNTER — Ambulatory Visit (INDEPENDENT_AMBULATORY_CARE_PROVIDER_SITE_OTHER): Payer: Medicare Other | Admitting: Internal Medicine

## 2013-06-01 VITALS — BP 87/62 | HR 59 | Temp 97.1°F | Ht 65.5 in | Wt 177.0 lb

## 2013-06-01 DIAGNOSIS — Z7901 Long term (current) use of anticoagulants: Secondary | ICD-10-CM

## 2013-06-01 DIAGNOSIS — R05 Cough: Secondary | ICD-10-CM

## 2013-06-01 DIAGNOSIS — A3700 Whooping cough due to Bordetella pertussis without pneumonia: Secondary | ICD-10-CM

## 2013-06-01 DIAGNOSIS — R0902 Hypoxemia: Secondary | ICD-10-CM

## 2013-06-01 DIAGNOSIS — I5023 Acute on chronic systolic (congestive) heart failure: Secondary | ICD-10-CM

## 2013-06-01 DIAGNOSIS — I4891 Unspecified atrial fibrillation: Secondary | ICD-10-CM

## 2013-06-01 DIAGNOSIS — R791 Abnormal coagulation profile: Secondary | ICD-10-CM

## 2013-06-01 DIAGNOSIS — D45 Polycythemia vera: Secondary | ICD-10-CM

## 2013-06-01 DIAGNOSIS — I10 Essential (primary) hypertension: Secondary | ICD-10-CM

## 2013-06-01 DIAGNOSIS — D751 Secondary polycythemia: Secondary | ICD-10-CM

## 2013-06-01 DIAGNOSIS — I428 Other cardiomyopathies: Secondary | ICD-10-CM

## 2013-06-01 DIAGNOSIS — I5022 Chronic systolic (congestive) heart failure: Secondary | ICD-10-CM

## 2013-06-01 LAB — CBC WITH DIFFERENTIAL/PLATELET
Basophils Absolute: 0 10*3/uL (ref 0.0–0.1)
Basophils Relative: 0 % (ref 0–1)
Eosinophils Absolute: 0.4 10*3/uL (ref 0.0–0.7)
Eosinophils Relative: 2 % (ref 0–5)
Lymphs Abs: 2.8 10*3/uL (ref 0.7–4.0)
MCH: 32.8 pg (ref 26.0–34.0)
MCV: 98.7 fL (ref 78.0–100.0)
Neutrophils Relative %: 73 % (ref 43–77)
Platelets: 277 10*3/uL (ref 150–400)
RBC: 5.42 MIL/uL (ref 4.22–5.81)
RDW: 15.3 % (ref 11.5–15.5)

## 2013-06-01 LAB — POCT INR: INR: 6

## 2013-06-01 NOTE — Telephone Encounter (Signed)
C/D 06/01/13 for appt. 06/12/13

## 2013-06-01 NOTE — Assessment & Plan Note (Addendum)
Resolved, 97% on room air

## 2013-06-01 NOTE — Progress Notes (Signed)
  Subjective:    Patient ID: [name], male    DOB: 1946/02/16, 67 y.o.   MRN: [id number]  HPI  Presents for hospital f/u of cough and hypoxia.  Pt admitted with cough for 2 weeks with positive sick contacts from his grandchildren, CXR without infiltrates but O2 levels decreased to high 80s on ambulation.  He was admitted and treated with nebulized bronchodilators.  On admission he was tested for B. Pertussis Abs which returned positive for IgG and IgA and negative for IgM indicating recent infection or recent vaccination.  Pt states that he believes that he was vaccinated years ago but is unsure of the date.  Cough has totally resolved (now 4 weeks after onset).  Hx is significant for CHF, hypertension, polycythemia, cardiomyopathy and atrial fibrillation on carvedilol 12.5 mg bid and amiodarone 200 mg qd followed by [name] of Cardiology and anticoagulated with Coumadin.  INR is supratherapeutic today at 6.0. No nose bleeds, bleeding gums, blood in stools, urine or other sources of bleeding.   Review of Systems  Constitutional: Negative for fever and fatigue.  HENT: Negative for nosebleeds.   Eyes: Negative for visual disturbance.  Respiratory: Negative for chest tightness and shortness of breath.   Cardiovascular: Negative for chest pain, palpitations and leg swelling.  Endocrine: Negative.   Genitourinary: Negative for dysuria and hematuria.  Musculoskeletal: Negative.   Skin: Negative for rash.  Allergic/Immunologic: Negative.   Neurological: Negative for weakness.  Hematological: Does not bruise/bleed easily.  Psychiatric/Behavioral: Negative.        Objective:   Physical Exam  Constitutional: He is oriented to person, place, and time. He appears well-developed and well-nourished. No distress.  HENT:  Head: Normocephalic and atraumatic.  Facial plethora  Eyes: Conjunctivae and EOM are normal. Pupils are equal, round, and reactive to light.  Neck: Neck supple.   Cardiovascular: Normal rate, regular rhythm, normal heart sounds and intact distal pulses.   No murmur heard. Pulmonary/Chest: Effort normal and breath sounds normal.  Abdominal: Bowel sounds are normal.  Musculoskeletal: Normal range of motion. He exhibits no edema and no tenderness.  Neurological: He is alert and oriented to person, place, and time.  Skin: Skin is warm and dry. No rash noted.  Psychiatric: He has a normal mood and affect. His behavior is normal. Judgment and thought content normal.          Assessment & Plan:  See separate problem list charting:  #1 Whooping cough, likely: resolved  #2 polycythemia: will check CBC today and schedule appt with Hematology  #3 systolic CHF and cardiomyopathy: EF 40%, followed by [name] -check 2d ECHO which per [name] note he wanted to get repeat after 08/2012 ECHO  #4 atrial fib: on carvedilol and amiodarone -cont carvedilol 12.5 mg bid -hold Coumadin--> INR 6.0  #5 supratherapeutic INR: in setting of concurrent amiodarone, hold Coumadin, f/u on Monday (4 days)  Recent Labs Lab 06/01/13 1420  INR 6.0    #6 hypertension: hypotensive on carvedilol 12.5 bid, lasix 20 mg qd, and lisinopril 5 mg qd. Not orthostatic on exam -hold lisinopril, recheck bp on f/u Monday  #7 hypoxia: in setting of polycythemia, resolved. Prior 2ppd smokig now down to less than 1 ppd. -cont to monitor -consider PFTS

## 2013-06-01 NOTE — Patient Instructions (Addendum)
General Instructions: It was good to see you again, [name]. I am glad to see that your cough has resolved. Information about Whooping cough is at the end of this handout. I have stopped your lisinopril blood pressure medicine for now since your blood pressure has been low several times. DO NOT TAKE THE NEXT DOSES OF COUMADIN (WARFARIN) UNTIL YOU HAVE BEEN SEEN IN THE CLINIC ON MONDAY. If you develop bleeding gums, nose bleeds, blood in stools or fall, please call the clinic or go to the Emergency Room. We will check your blood level today. We will scheduled your appointment with the blood doctor (hematologist)  We have scheduled an ultrasound of your heart on Nov 21st at 10:45. Keep your follow-up appointment with Dr. Donnie Hamilton of cardiology.   Treatment Goals:  Goals (1 Years of Data) as of 06/01/13         As of Today As of Today As of Today As of Today 05/19/13     Blood Pressure    . Blood Pressure < 140/90  87/62 100/71 101/64 86/61 105/66      Progress Toward Treatment Goals:  Treatment Goal 06/01/2013  Blood pressure Below goal  Stop smoking -    Self Care Goals & Plans:  Self Care Goal 06/01/2013  Manage my medications take my medicines as prescribed; refill my medications on time; bring my medications to every visit  Monitor my health -  Eat healthy foods eat baked foods instead of fried foods  Be physically active take a walk every day  Stop smoking go to the [employer] ([employer])  Meeting treatment goals -    No flowsheet data found.   Care Management & Community Referrals:  Referral 01/03/2013  Referrals made for care management support smoking cessation counselor  Referrals made to community resources smoking cessation     Pertussis, Adult Pertussis (whooping cough) is an infection that causes severe and sudden coughing attacks. CAUSES  Pertussis is caused by bacteria. It is very contagious and spreads to others by the droplets  sprayed in the air when an infected person talks, coughs, and sneezes. You may have caught pertussis from inhaling these droplets or from touching a surface where the droplets fell and then touching the mouth or nose. SYMPTOMS  Early during this infection, symptoms of pertussis are similar to those of the common cold. They include a runny nose, low fever, mild cough, and red, watery eyes. After 1 2 weeks the cold symptoms get better, but the cough worsens and severe and sudden coughing attacks frequently develop. During these attacks people may cough so hard that vomiting occurs. Over the next month to 6 weeks, the cough starts to get better, but it may take as long as 6 months for the cough to go away completely. DIAGNOSIS  Your caregiver will perform a physical exam. The caregiver may take a mucus sample from the nose and throat and a blood sample to help confirm the diagnosis. The caregiver may also take a chest X-ray. TREATMENT  Antibiotic medications are usually prescribed for this infection if diagnosis within 3 weeks of onset of symptoms. Starting antibiotics quickly may help shorten the illness and make it less contagious. Antibiotics may also be prescribed for everyone living in the same household. Immunization may be recommended for those in the household at risk of developing pertussis. At-risk groups include:  Infants.  Those who have not had their full course of pertussis immunizations.  Those who were immunized but  have not had their recent booster shot. Mild coughing may continue for months after the infection is treated from the remaining soreness and swelling (inflammation) in the lungs. HOME CARE INSTRUCTIONS   Take your antibiotics as directed. Finish them even if you start to feel better.  Do not take cough medication unless prescribed by your caregiver. Coughing is a protective mechanism that helps keep colored mucus (sputum) and secretions from clogging breathing  passages.  Stay away from those who are at risk of developing pertussis for the first 5 days of antibiotic treatment. If no antibiotics are prescribed, stay at home for the first 3 weeks you are coughing.  Do not go to work until you have been treated with antibiotics for 5 days. If no antibiotics are prescribed, do not go to work for the first 3 weeks you are coughing. Inform your workplace that you were diagnosed with pertussis.  Wash your hands often. Those living in the same household should also wash their hands often to avoid spreading the infection.  Avoid substances that may irritate the lungs, such as smoke, aerosols, or fumes. These substances may worsen your coughing.  If you are having a coughing attack:  Raise the head of your mattress to help clear sputum more easily and improve breathing.  Sit upright.  Use a cool mist humidifier at home to increase air moisture. This will soothe your cough and help loosen sputum. Do not use hot steam.  Rest as much as possible. Normal activity may be gradually resumed.  Drink enough fluids to keep your urine clear or pale yellow. PREVENTION  Pertussis can be prevented with a vaccine and later booster shots. The pertussis vaccine is usually given during childhood. Adults who were not previously vaccinated should be vaccinated as soon as possible. Adults who were previously vaccinated should talk to their caregivers about the need for a booster shot because immunity from the vaccine decreases over time. All of the following persons should consider receiving a booster dose of pertussis, which is combined with tetanus and diphtheria (Tdap) vaccine:  Pregnant women during each pregnancy, preferably at 27 36 weeks of pregnancy (gestation).  All persons who have or will have close contact with an infant aged less than 12 months. Infants are at highest risk for life-threatening complications from pertussis.  All health care personnel. SEEK  MEDICAL CARE IF:   You have persistent vomiting.  You are not able to eat or drink fluids.  You do not seem to be improving. SEEK IMMEDIATE MEDICAL CARE IF:   Your face turns red or blue during a coughing attack.  You become unconscious after a coughing attack, even if only for a few moments.  Your breathing stops for a period of time (apnea).  You are restless or cannot sleep.  You are listless or sleeping too much.  You have a fever. MAKE SURE YOU:  Understand these instructions.   Will watch your condition.   Will get help right away if you are not doing well or get worse. Document Released: 10/31/2012 Document Reviewed: 10/31/2012 [facility] Patient Information 2014 [place], [place].

## 2013-06-01 NOTE — Assessment & Plan Note (Signed)
INR of 6 today.  Patient will hold Coumadin over the next 3 days (weekend) and return to clinic for recheck on Monday. Informed of warning signs to return to clinic or ED including any bleeding or falls.  Recent Labs Lab 06/01/13 1420  INR 6.0

## 2013-06-01 NOTE — Assessment & Plan Note (Addendum)
BP Readings from Last 3 Encounters:  06/01/13 86/61  05/19/13 105/66  03/24/13 104/64    Lab Results  Component Value Date   NA 140 05/19/2013   K 4.1 05/19/2013   CREATININE 0.72 05/19/2013    Assessment: Blood pressure control: controlled Progress toward BP goal: below goal Comments: asking for bp med refill  Plan: Medications:  On lisinopril, carvedilol, and lasix Educational resources provided:   Self management tools provided:   Other plans: hold lisinopril, return to clinic in 1 week to re-assess as pt does not check bp at home

## 2013-06-01 NOTE — Assessment & Plan Note (Signed)
Positive IgG, IgA with neg IgM indicating likely resolving recent infection as pt has not been recently vaccinated. Pt >3 weeks since onset of cough thus no antibiotics.

## 2013-06-01 NOTE — Telephone Encounter (Signed)
CSW placed called to pt unable to leave message

## 2013-06-05 ENCOUNTER — Ambulatory Visit (INDEPENDENT_AMBULATORY_CARE_PROVIDER_SITE_OTHER): Payer: Medicare Other | Admitting: Pharmacist

## 2013-06-05 ENCOUNTER — Other Ambulatory Visit: Payer: Medicare Other

## 2013-06-05 DIAGNOSIS — I4891 Unspecified atrial fibrillation: Secondary | ICD-10-CM

## 2013-06-05 DIAGNOSIS — Z7901 Long term (current) use of anticoagulants: Secondary | ICD-10-CM

## 2013-06-05 DIAGNOSIS — Z1211 Encounter for screening for malignant neoplasm of colon: Secondary | ICD-10-CM

## 2013-06-05 LAB — POC HEMOCCULT BLD/STL (HOME/3-CARD/SCREEN)
Card #2 Fecal Occult Blod, POC: NEGATIVE
Card #3 Fecal Occult Blood, POC: NEGATIVE
Fecal Occult Blood, POC: NEGATIVE

## 2013-06-05 LAB — POCT INR: INR: 4.4

## 2013-06-05 NOTE — Progress Notes (Signed)
Anti-Coagulation Progress Note  [name] is a 67 y.o. male who is currently on an anti-coagulation regimen.    RECENT RESULTS: Recent results are below, the most recent result is correlated with a dose of 17.5 mg. per week with OMITTED DOSES due to high INR: Lab Results  Component Value Date   INR 4.40 06/05/2013   INR 6.0 06/01/2013   INR 2.99* 05/18/2013    ANTI-COAG DOSE: Anticoagulation Dose Instructions as of 06/05/2013     Glynis Smiles Tue Wed Thu Fri Sat   New Dose 2.5 mg 5 mg 2.5 mg 2.5 mg 5 mg 2.5 mg 2.5 mg       ANTICOAG SUMMARY: Anticoagulation Episode Summary   Current INR goal 2.0-3.0  Next INR check 06/12/2013  INR from last check 4.40! (06/05/2013)  Weekly max dose   Target end date Indefinite  [facility]  Preferred lab   Send INR reminders to    Indications  Atrial fibrillation [427.31] Long term (current) use of anticoagulants [V58.61]        Comments         ANTICOAG TODAY: Anticoagulation Summary as of 06/05/2013   INR goal 2.0-3.0  Selected INR 4.40! (06/05/2013)  Next INR check 06/12/2013  Target end date Indefinite   Indications  Atrial fibrillation [427.31] Long term (current) use of anticoagulants [V58.61]      Anticoagulation Episode Summary   [facility]   Preferred lab    Send INR reminders to    Comments       PATIENT INSTRUCTIONS: Patient Instructions  Patient instructed to take medications as defined in the Anti-coagulation Track section of this encounter.  Patient instructed to take today's dose.  Patient verbalized understanding of these instructions.        FOLLOW-UP Return in 7 days (on 06/12/2013) for Follow up INR at 0945h.  Hulen Luster, III Pharm.D., CACP

## 2013-06-05 NOTE — Patient Instructions (Signed)
Patient instructed to take medications as defined in the Anti-coagulation Track section of this encounter.  Patient instructed to take today's dose.  Patient verbalized understanding of these instructions.    

## 2013-06-05 NOTE — Progress Notes (Signed)
Case discussed with Dr. Schooler at the time of the visit.  We reviewed the resident's history and exam and pertinent patient test results.  I agree with the assessment, diagnosis, and plan of care documented in the resident's note.     

## 2013-06-09 ENCOUNTER — Ambulatory Visit (HOSPITAL_COMMUNITY)
Admission: RE | Admit: 2013-06-09 | Discharge: 2013-06-09 | Disposition: A | Discharge status: Home / Self Care | Payer: Medicare Other | Source: Ambulatory Visit | Attending: Internal Medicine | Admitting: Internal Medicine

## 2013-06-09 DIAGNOSIS — I359 Nonrheumatic aortic valve disorder, unspecified: Secondary | ICD-10-CM

## 2013-06-09 DIAGNOSIS — I10 Essential (primary) hypertension: Secondary | ICD-10-CM | POA: Insufficient documentation

## 2013-06-09 DIAGNOSIS — I428 Other cardiomyopathies: Secondary | ICD-10-CM

## 2013-06-09 DIAGNOSIS — I509 Heart failure, unspecified: Secondary | ICD-10-CM | POA: Insufficient documentation

## 2013-06-09 DIAGNOSIS — I5022 Chronic systolic (congestive) heart failure: Secondary | ICD-10-CM

## 2013-06-09 DIAGNOSIS — I4891 Unspecified atrial fibrillation: Secondary | ICD-10-CM

## 2013-06-09 DIAGNOSIS — I079 Rheumatic tricuspid valve disease, unspecified: Secondary | ICD-10-CM | POA: Insufficient documentation

## 2013-06-09 DIAGNOSIS — I429 Cardiomyopathy, unspecified: Secondary | ICD-10-CM

## 2013-06-09 DIAGNOSIS — I379 Nonrheumatic pulmonary valve disorder, unspecified: Secondary | ICD-10-CM | POA: Insufficient documentation

## 2013-06-09 NOTE — Progress Notes (Signed)
[facility]  2D Echocardiogram completed.  Ouida Abeyta L Janellie Tennison, RDCS 06/09/2013 12:56 PM

## 2013-06-12 ENCOUNTER — Ambulatory Visit (INDEPENDENT_AMBULATORY_CARE_PROVIDER_SITE_OTHER): Payer: Medicare Other | Admitting: Pharmacist

## 2013-06-12 ENCOUNTER — Ambulatory Visit (HOSPITAL_BASED_OUTPATIENT_CLINIC_OR_DEPARTMENT_OTHER): Payer: Medicare Other | Admitting: Hematology and Oncology

## 2013-06-12 ENCOUNTER — Encounter: Payer: Self-pay | Admitting: Hematology and Oncology

## 2013-06-12 ENCOUNTER — Telehealth: Payer: Self-pay | Admitting: Hematology and Oncology

## 2013-06-12 ENCOUNTER — Ambulatory Visit: Payer: Medicare Other

## 2013-06-12 ENCOUNTER — Ambulatory Visit (HOSPITAL_BASED_OUTPATIENT_CLINIC_OR_DEPARTMENT_OTHER): Payer: Medicare Other | Admitting: Lab

## 2013-06-12 ENCOUNTER — Other Ambulatory Visit: Payer: Self-pay | Admitting: *Deleted

## 2013-06-12 VITALS — BP 110/59 | HR 53 | Temp 98.4°F | Resp 18 | Ht 65.5 in | Wt 176.6 lb

## 2013-06-12 DIAGNOSIS — F17200 Nicotine dependence, unspecified, uncomplicated: Secondary | ICD-10-CM

## 2013-06-12 DIAGNOSIS — D45 Polycythemia vera: Secondary | ICD-10-CM

## 2013-06-12 DIAGNOSIS — D72829 Elevated white blood cell count, unspecified: Secondary | ICD-10-CM

## 2013-06-12 DIAGNOSIS — D751 Secondary polycythemia: Secondary | ICD-10-CM

## 2013-06-12 DIAGNOSIS — I4891 Unspecified atrial fibrillation: Secondary | ICD-10-CM

## 2013-06-12 DIAGNOSIS — Z7901 Long term (current) use of anticoagulants: Secondary | ICD-10-CM

## 2013-06-12 DIAGNOSIS — Z720 Tobacco use: Secondary | ICD-10-CM

## 2013-06-12 HISTORY — DX: Polycythemia vera: D45

## 2013-06-12 LAB — COMPREHENSIVE METABOLIC PANEL (CC13)
Anion Gap: 7 mEq/L (ref 3–11)
BUN: 14.6 mg/dL (ref 7.0–26.0)
CO2: 32 mEq/L — ABNORMAL HIGH (ref 22–29)
Calcium: 8.9 mg/dL (ref 8.4–10.4)
Chloride: 104 mEq/L (ref 98–109)
Creatinine: 0.9 mg/dL (ref 0.7–1.3)
Glucose: 92 mg/dl (ref 70–140)
Potassium: 3.7 mEq/L (ref 3.5–5.1)
Sodium: 143 mEq/L (ref 136–145)
Total Protein: 6.9 g/dL (ref 6.4–8.3)

## 2013-06-12 LAB — CBC WITH DIFFERENTIAL/PLATELET
Basophils Absolute: 0.1 10*3/uL (ref 0.0–0.1)
EOS%: 5.7 % (ref 0.0–7.0)
Eosinophils Absolute: 0.7 10*3/uL — ABNORMAL HIGH (ref 0.0–0.5)
HCT: 50.1 % — ABNORMAL HIGH (ref 38.4–49.9)
HGB: 16.5 g/dL (ref 13.0–17.1)
MCH: 31.7 pg (ref 27.2–33.4)
MONO#: 0.8 10*3/uL (ref 0.1–0.9)
NEUT#: 9.5 10*3/uL — ABNORMAL HIGH (ref 1.5–6.5)
NEUT%: 73.4 % (ref 39.0–75.0)
RDW: 16 % — ABNORMAL HIGH (ref 11.0–14.6)
lymph#: 1.8 10*3/uL (ref 0.9–3.3)

## 2013-06-12 LAB — POCT INR: INR: 2.8

## 2013-06-12 LAB — URIC ACID (CC13): Uric Acid, Serum: 8 mg/dl — ABNORMAL HIGH (ref 2.6–7.4)

## 2013-06-12 NOTE — Patient Instructions (Signed)
Patient instructed to take medications as defined in the Anti-coagulation Track section of this encounter.  Patient instructed to take today's dose.  Patient verbalized understanding of these instructions.    

## 2013-06-12 NOTE — Progress Notes (Signed)
[facility] CONSULT NOTE  Patient Care Team: Annett Gula, MD as PCP - General (Internal Medicine) Artis Delay, MD as Consulting Physician (Hematology and Oncology)  CHIEF COMPLAINTS/PURPOSE OF CONSULTATION:  Leukocytosis and polycythemia  HISTORY OF PRESENTING ILLNESS:  [name] 67 y.o. male is here because of abnormal CBC. This patient have coronary artery disease as well as congestive heart failure. He also has history of atrial fibrillation and is on long-term anticoagulation therapy. The patient had multiple blood work which show that he has consistent high hemoglobin and high white count. He is being referred here for further evaluation. He denies any history of thrombosis such as DVT or PE. He is not taking any testosterone replacement therapy. He was never diagnosed with obstructive sleep apnea. The patient has a significant smoking history. He has smoked for 40 years 1-2 packs of cigarettes per day, recently he is able to cut down on his cigarette smoking to one to 2 cigarettes per day. He appears to be motivated to quit. He denies any headaches, chest pain on exertion, alert cramps. With his history of leukocytosis, he denies any history of recurrent infection. He does have history of COPD and is using an inhaler. He was treated with an antibiotic therapy recently for possible COPD exacerbation.  MEDICAL HISTORY:  Past Medical History  Diagnosis Date  . Cardiomyopathy   . Substance abuse     alcohol  . Hypertension   . Atrial flutter      status post ablation on 09/11/2010  . CHF (congestive heart failure)     EF 35% with diffuse hypokinesis  . Atrial fibrillation   . Shortness of breath     "all the time lately" (12/14/2012)  . GERD (gastroesophageal reflux disease)   . Hepatitis     "think I had that once; a long time ago; from dirty needles I think" (12/14/2012)  . 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 Leukocytosis, unspecified 06/12/2013     SURGICAL HISTORY: Past Surgical History  Procedure Laterality Date  . Hemorrhoid surgery  1970's  . Cardiac electrophysiology mapping and ablation  08/2010    /notes 09/07/2010 (12/14/2012)    SOCIAL HISTORY: History   Social History  . Marital Status: Single    Spouse Name: N/A    Number of Children: N/A  . Years of Education: N/A   Occupational History  . Not on file.   Social History Main Topics  . Smoking status: Current Every Day Smoker -- 1.00 packs/day for 40 years    Types: Cigarettes  . Smokeless tobacco: Never Used     Comment: patient smoked 1-2ppd x 50 years, now smoking approx 4-5 cigs per day x 1 year  . Alcohol Use: 0.0 oz/week     Comment: drinks a beer once in a while for the past year, but has h/o heavy alcohol use for many years before this  . Drug Use: Yes    Special: "Crack" cocaine, Cocaine, Heroin     Comment: 03/20/2013 "been clean for awhile"; endorses using heroin "all my life" up until about a year ago  . Sexual Activity: Not Currently   Other Topics Concern  . Not on file   Social History Narrative   Moved here from [place]. Lives with common law wife and grandchildren. 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 Diabetes Mother   . Hypertension Mother   . Cirrhosis Father   .  Alcohol abuse Father     ALLERGIES:  is allergic to codeine.  MEDICATIONS:  Current Outpatient Prescriptions  Medication Sig Dispense Refill  . albuterol (PROVENTIL HFA;VENTOLIN HFA) 108 (90 BASE) MCG/ACT inhaler Inhale 2 puffs into the lungs every 6 (six) hours as needed for wheezing.  1 Inhaler  2  . amiodarone (PACERONE) 200 MG tablet Take 1 tablet (200 mg total) by mouth daily.  30 tablet  5  . benzonatate (TESSALON) 100 MG capsule Take 1 capsule (100 mg total) by mouth 2 (two) times daily.  20 capsule  0  . Budesonide (PULMICORT FLEXHALER) 90 MCG/ACT inhaler Inhale 2 puffs into the lungs 2 (two)  times daily.  1 Inhaler  3  . carvedilol (COREG) 12.5 MG tablet Take 1 tablet (12.5 mg total) by mouth 2 (two) times daily with a meal.  180 tablet  1  . furosemide (LASIX) 40 MG tablet Take 0.5 tablets (20 mg total) by mouth daily.  30 tablet  1  . warfarin (COUMADIN) 5 MG tablet Take 2.5-5 mg by mouth daily. Take 1/2 tablet (2.5mg ) on Sunday, Monday, Wednesday, and Friday.  Take 1 tablet (5mg ) on Tuesday, Thursday, and Saturday       No current facility-administered medications for this visit.    REVIEW OF SYSTEMS:   Constitutional: Denies fevers, chills or abnormal night sweats Eyes: Denies blurriness of vision, double vision or watery eyes Ears, nose, mouth, throat, and face: Denies mucositis or sore throat Respiratory: Denies cough, dyspnea or wheezes Cardiovascular: Denies palpitation, chest discomfort or lower extremity swelling Gastrointestinal:  Denies nausea, heartburn or change in bowel habits Skin: Denies abnormal skin rashes Lymphatics: Denies new lymphadenopathy or easy bruising Neurological:Denies numbness, tingling or new weaknesses Behavioral/Psych: Mood is stable, no new changes  All other systems were reviewed with the patient and are negative.  PHYSICAL EXAMINATION: ECOG PERFORMANCE STATUS: 0 - Asymptomatic  Filed Vitals:   06/12/13 1037  BP: 110/59  Pulse: 53  Temp: 98.4 F (36.9 C)  Resp: 18   Filed Weights   06/12/13 1037  Weight: 176 lb 9.6 oz (80.105 kg)    GENERAL:alert, no distress and comfortable. The patient has a plethoric complexion SKIN: skin color, texture, turgor are normal, no rashes or significant lesions. He appeared to have evidence of central cyanosis EYES: normal, conjunctiva are pink and non-injected, sclera clear OROPHARYNX:no exudate, no erythema and lips, buccal mucosa, and tongue normal  NECK: supple, thyroid normal size, non-tender, without nodularity LYMPH:  no palpable lymphadenopathy in the cervical, axillary or  inguinal LUNGS: clear to auscultation and percussion with normal breathing effort HEART: regular rate & rhythm and no murmurs and no lower extremity edema ABDOMEN:abdomen soft, non-tender and normal bowel sounds. No palpable splenomegaly Musculoskeletal:no cyanosis of digits and no clubbing  PSYCH: alert & oriented x 3 with fluent speech NEURO: no focal motor/sensory deficits  LABORATORY DATA:  I have reviewed the data as listed Lab Results  Component Value Date   WBC 14.1* 06/01/2013   HGB 17.8* 06/01/2013   HCT 53.5* 06/01/2013   MCV 98.7 06/01/2013   PLT 277 06/01/2013    RADIOGRAPHIC STUDIES: I have personally reviewed the radiological images as listed and agreed with the findings in the report. Dg Chest 2 View  05/18/2013   CLINICAL DATA:  Cough and congestion.  EXAM: CHEST  2 VIEW  COMPARISON:  03/20/2013.  FINDINGS: The cardiac silhouette, mediastinal and hilar contours are within normal limits and stable. There are mild chronic  bronchitic type interstitial lung changes, likely related to smoking. No focal infiltrates, edema or effusions. The bony thorax is intact. Stable advanced degenerative changes involving the right shoulder.  IMPRESSION: Chronic bronchitic type changes, likely related to smoking.  No focal infiltrates or effusion.   Electronically Signed   By: Loralie Champagne M.D.   On: 05/18/2013 09:46    ASSESSMENT:  #1 chronic polycythemia #2 chronic leukocytosis  PLAN:  #1 chronic polycythemia His hemoglobin fluctuated between 15.8 to as high as 20.7. I suspect the cause of this is likely secondary erythrocytosis from heavy smoking. Less likely, it could be due to myeloproliferative disorder. I will order an additional workup for this. I am concerned about risk of exacerbation of congestive heart failure. I recommend urgent phlebotomy today. He agreed. In the meantime he will continue on chronic anticoagulation therapy. #2 chronic leukocytosis His white count has  fluctuated between 9.8 to as high as 27. I suspect this is due to chronic COPD. I will order an additional workup of this. #3 atrial fibrillation The patient remain on anticoagulation therapy for this. It would also help with his significant polycythemia to prevent risk of thrombosis.The patient denies any recent signs or symptoms of bleeding such as spontaneous epistaxis, hematuria or hematochezia. #4 smoking history The patient is motivated to quit smoking. I continue to encourage him to do so. Orders Placed This Encounter  Procedures  . CBC with Differential    Standing Status: Future     Number of Occurrences:      Standing Expiration Date: 03/04/2014  . Comprehensive metabolic panel    Standing Status: Future     Number of Occurrences:      Standing Expiration Date: 06/12/2014  . Lactate dehydrogenase    Standing Status: Future     Number of Occurrences:      Standing Expiration Date: 06/12/2014  . Uric Acid    Standing Status: Future     Number of Occurrences:      Standing Expiration Date: 06/12/2014  . [id number]    Standing Status: Future     Number of Occurrences:      Standing Expiration Date: 06/12/2014  . Erythropoietin    Standing Status: Future     Number of Occurrences:      Standing Expiration Date: 06/12/2014  . Perform theraputic phlebotomy    1 unit of blood to be removed    Standing Status: Standing     Number of Occurrences: 1     Standing Expiration Date:     All questions were answered. The patient knows to call the clinic with any problems, questions or concerns.    Giorgio Chabot, MD 06/12/2013 11:10 AM

## 2013-06-12 NOTE — Progress Notes (Signed)
Anti-Coagulation Progress Note  [name] is a 67 y.o. male who is currently on an anti-coagulation regimen.    RECENT RESULTS: Recent results are below, the most recent result is correlated with a dose of 22.5 mg. per week: Lab Results  Component Value Date   INR 2.80 06/12/2013   INR 4.40 06/05/2013   INR 6.0 06/01/2013    ANTI-COAG DOSE: Anticoagulation Dose Instructions as of 06/12/2013     Glynis Smiles Tue Wed Thu Fri Sat   New Dose 2.5 mg 5 mg 2.5 mg 2.5 mg 5 mg 2.5 mg 2.5 mg       ANTICOAG SUMMARY: Anticoagulation Episode Summary   Current INR goal 2.0-3.0  Next INR check 07/10/2013  INR from last check 2.80 (06/12/2013)  Weekly max dose   Target end date Indefinite  [facility]  Preferred lab   Send INR reminders to    Indications  Atrial fibrillation [427.31] Long term (current) use of anticoagulants [V58.61]        Comments         ANTICOAG TODAY: Anticoagulation Summary as of 06/12/2013   INR goal 2.0-3.0  Selected INR 2.80 (06/12/2013)  Next INR check 07/10/2013  Target end date Indefinite   Indications  Atrial fibrillation [427.31] Long term (current) use of anticoagulants [V58.61]      Anticoagulation Episode Summary   [facility]   Preferred lab    Send INR reminders to    Comments       PATIENT INSTRUCTIONS: Patient Instructions  Patient instructed to take medications as defined in the Anti-coagulation Track section of this encounter.  Patient instructed to take today's dose.  Patient verbalized understanding of these instructions.       FOLLOW-UP Return in 4 weeks (on 07/10/2013) for Follow up INR at 0900h.  Hulen Luster, III Pharm.D., CACP

## 2013-06-12 NOTE — Telephone Encounter (Signed)
gv and printed appt sched and avs for pt for pt for DEc.....sent pt to lab...charge nurse aware to add phlebotomy

## 2013-06-12 NOTE — Telephone Encounter (Signed)
s.w. pt and advised on 12.2.14 appt...pt ok and aware

## 2013-06-13 LAB — ERYTHROPOIETIN: Erythropoietin: 0.9 m[IU]/mL — ABNORMAL LOW (ref 2.6–18.5)

## 2013-06-13 LAB — SEDIMENTATION RATE: Sed Rate: 1 mm/hr (ref 0–16)

## 2013-06-20 ENCOUNTER — Ambulatory Visit (HOSPITAL_BASED_OUTPATIENT_CLINIC_OR_DEPARTMENT_OTHER): Payer: Medicare Other

## 2013-06-20 VITALS — BP 96/63 | HR 62 | Temp 97.5°F

## 2013-06-20 DIAGNOSIS — D45 Polycythemia vera: Secondary | ICD-10-CM

## 2013-06-20 DIAGNOSIS — D751 Secondary polycythemia: Secondary | ICD-10-CM

## 2013-06-20 NOTE — Patient Instructions (Signed)
Therapeutic Phlebotomy Therapeutic phlebotomy is the controlled removal of blood from your body for the purpose of treating a medical condition. It is similar to donating blood. Usually, about a pint (470 mL) of blood is removed. The average adult has 9 to 12 pints (4.3 to 5.7 L) of blood. Therapeutic phlebotomy may be used to treat the following medical conditions:  Hemochromatosis. This is a condition in which there is too much iron in the blood.  Polycythemia vera. This is a condition in which there are too many red cells in the blood.  Porphyria cutanea tarda. This is a disease usually passed from one generation to the next (inherited). It is a condition in which an important part of hemoglobin is not made properly. This results in the build up of abnormal amounts of porphyrins in the body.  Sickle cell disease. This is an inherited disease. It is a condition in which the red blood cells form an abnormal crescent shape rather than a round shape. LET YOUR CAREGIVER KNOW ABOUT:  Allergies.  Medicines taken including herbs, eyedrops, over-the-counter medicines, and creams.  Use of steroids (by mouth or creams).  Previous problems with anesthetics or numbing medicine.  History of blood clots.  History of bleeding or blood problems.  Previous surgery.  Possibility of pregnancy, if this applies. RISKS AND COMPLICATIONS This is a simple and safe procedure. Problems are unlikely. However, problems can occur and may include:  Nausea or lightheadedness.  Low blood pressure.  Soreness, bleeding, swelling, or bruising at the needle insertion site.  Infection. BEFORE THE PROCEDURE  This is a procedure that can be done as an outpatient. Confirm the time that you need to arrive for your procedure. Confirm whether there is a need to fast or withhold any medications. It is helpful to wear clothing with sleeves that can be raised above the elbow. A blood sample may be done to determine the  amount of red blood cells or iron in your blood. Plan ahead of time to have someone drive you home after the procedure. PROCEDURE The entire procedure from preparation through recovery takes about 1 hour. The actual collection takes about 10 to 15 minutes.  A needle will be inserted into your vein.  Tubing and a collection bag will be attached to that needle.  Blood will flow through the needle and tubing into the collection bag.  You may be asked to open and close your hand slowly and continuously during the entire collection.  Once the specified amount of blood has been removed from your body, the collection bag and tubing will be clamped.  The needle will be removed.  Pressure will be held on the site of the needle insertion to stop the bleeding. Then a bandage will be placed over the needle insertion site. AFTER THE PROCEDURE  Your recovery will be assessed and monitored. If there are no problems, as an outpatient, you should be able to go home shortly after the procedure.  Document Released: 12/08/2010 Document Revised: 09/28/2011 Document Reviewed: 12/08/2010 ExitCare Patient Information 2014 [employer]  Therapeutic Phlebotomy Care After Refer to this sheet in the next few weeks. These instructions provide you with information on caring for yourself after your procedure. Your caregiver may also give you more specific instructions. Your treatment has been planned according to current medical practices, but problems sometimes occur. Call your caregiver if you have any problems or questions after your procedure. HOME CARE INSTRUCTIONS Most people can go back to their normal   activities right away. Before you leave, be sure to ask if there is anything you should or should not do. In general, it would be wise to:  Keep the bandage dry. You can remove the bandage after about 5 hours.  Eat well-balanced meals for the next 24 hours.  Drink enough fluids to keep your urine clear or  pale yellow.  Avoid drinking alcohol minimally until after eating.  Avoid smoking for at least 30 minutes after the procedure.  Avoid strenous physical activity or heavy lifting or pulling for about 5 hours after the procedure.  Athletes should avoid strenous exercise for 12 hours or more.  Change positions slowly for the remainder of the day to prevent lightheadedness or fainting.  If you feel lightheaded, lie down until the feeling subsides.  If you have bleeding from the needle insertion site, elevate your arm and press firmly on the site until the bleeding stops.  If bruising or bleeding appears under the skin, apply ice to the area for 15 to 20 minutes, 3 to 4 times per day. Put the ice in a plastic bag and place a towel between the bag of ice and your skin. Do this while you are awake for the first 24 hours. The ice packs can be stopped before 24 hours if the swelling goes away. If swelling persists after 24 hours, a warm, moist washcloth can be applied to the area for 15 to 20 minutes, 3 to 4 times per day. The warm, moist treatments can be stopped when the swelling goes away.  It is important to continue further therapeutic phlebotomy as directed by your caregiver. SEEK MEDICAL CARE IF:  There is bleeding or fluid leaking from the needle insertion site.  The needle insertion site becomes swollen, red, or sore.  You feel lightheaded, dizzy or nauseated, and the feeling does not go away.  You notice new bruising at the needle insertion site.  You feel more weak or tired than normal.  You develop a fever. SEEK IMMEDIATE MEDICAL CARE IF:   There is increased bleeding, pain, or swelling from the needle insertion site.  You have severe nausea or vomiting.  You have chest pain.  You have trouble breathing. MAKE SURE YOU:  Understand these instructions.  Will watch your condition.  Will get help right away if you are not doing well or get worse. Document Released:  12/08/2010 Document Revised: 09/28/2011 Document Reviewed: 12/08/2010 ExitCare Patient Information 2014 [employer].   

## 2013-06-22 NOTE — Addendum Note (Signed)
Addended by: Bufford Spikes on: 06/22/2013 03:27 PM   Modules accepted: Orders

## 2013-06-26 ENCOUNTER — Encounter: Payer: Self-pay | Admitting: Hematology and Oncology

## 2013-06-26 ENCOUNTER — Ambulatory Visit (HOSPITAL_BASED_OUTPATIENT_CLINIC_OR_DEPARTMENT_OTHER): Payer: Medicare Other | Admitting: Hematology and Oncology

## 2013-06-26 ENCOUNTER — Encounter: Payer: Medicare Other | Admitting: Hematology and Oncology

## 2013-06-26 ENCOUNTER — Ambulatory Visit (HOSPITAL_BASED_OUTPATIENT_CLINIC_OR_DEPARTMENT_OTHER): Payer: Medicare Other | Admitting: Lab

## 2013-06-26 ENCOUNTER — Telehealth: Payer: Self-pay | Admitting: Hematology and Oncology

## 2013-06-26 ENCOUNTER — Other Ambulatory Visit (HOSPITAL_COMMUNITY)
Admission: RE | Admit: 2013-06-26 | Discharge: 2013-06-26 | Disposition: A | Discharge status: Home / Self Care | Payer: Medicare Other | Source: Ambulatory Visit | Attending: Hematology and Oncology | Admitting: Hematology and Oncology

## 2013-06-26 VITALS — BP 136/71 | HR 67 | Temp 98.8°F | Resp 18 | Ht 65.5 in | Wt 183.1 lb

## 2013-06-26 DIAGNOSIS — D47Z9 Other specified neoplasms of uncertain behavior of lymphoid, hematopoietic and related tissue: Secondary | ICD-10-CM

## 2013-06-26 DIAGNOSIS — D751 Secondary polycythemia: Secondary | ICD-10-CM

## 2013-06-26 DIAGNOSIS — D704 Cyclic neutropenia: Secondary | ICD-10-CM | POA: Insufficient documentation

## 2013-06-26 DIAGNOSIS — F17200 Nicotine dependence, unspecified, uncomplicated: Secondary | ICD-10-CM

## 2013-06-26 DIAGNOSIS — D72829 Elevated white blood cell count, unspecified: Secondary | ICD-10-CM | POA: Insufficient documentation

## 2013-06-26 LAB — CBC WITH DIFFERENTIAL/PLATELET
BASO%: 0.8 % (ref 0.0–2.0)
Basophils Absolute: 0.1 10*3/uL (ref 0.0–0.1)
Eosinophils Absolute: 0.6 10*3/uL — ABNORMAL HIGH (ref 0.0–0.5)
HCT: 44.9 % (ref 38.4–49.9)
HGB: 15.1 g/dL (ref 13.0–17.1)
MCH: 32.7 pg (ref 27.2–33.4)
MCHC: 33.5 g/dL (ref 32.0–36.0)
MONO#: 0.6 10*3/uL (ref 0.1–0.9)
NEUT#: 8.1 10*3/uL — ABNORMAL HIGH (ref 1.5–6.5)
NEUT%: 75.2 % — ABNORMAL HIGH (ref 39.0–75.0)
RBC: 4.6 10*6/uL (ref 4.20–5.82)
RDW: 16.1 % — ABNORMAL HIGH (ref 11.0–14.6)
WBC: 10.7 10*3/uL — ABNORMAL HIGH (ref 4.0–10.3)
lymph#: 1.4 10*3/uL (ref 0.9–3.3)

## 2013-06-26 LAB — CBC
HCT: 46.1 % (ref 39.0–52.0)
Hemoglobin: 15.2 g/dL (ref 13.0–17.0)
MCH: 32.6 pg (ref 26.0–34.0)
MCV: 98.9 fL (ref 78.0–100.0)
RBC: 4.66 MIL/uL (ref 4.22–5.81)
WBC: 10.7 10*3/uL — ABNORMAL HIGH (ref 4.0–10.5)

## 2013-06-26 NOTE — Progress Notes (Signed)
1200-BM biopsy complete to right hip per Dr. Bertis Ruddy.  Pt tolerated biopsy with no difficulties.  Pressure dressing applied.  VS stable at time of discharge.  BM biopsy discharge instructions reviewed with patient.  Teach back done.  Dressing to right hip clean, dry and intact at time of discharge.

## 2013-06-26 NOTE — Progress Notes (Signed)
This encounter was created in error - please disregard.

## 2013-06-26 NOTE — Progress Notes (Signed)
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 In November 2014, we removed one unit of blood and order an additional workup to rule out myeloproliferative disorder. Blood test result confirmed low erythropoietin level along with detectable JAK 2 mutation, confirmed myeloproliferative disorder  INTERVAL HISTORY: [name] 67 y.o. male returns for further followup. He denies any chest pain, or dizziness although cramps. The patient is attempting to quit smoking.  I have reviewed the past medical history, past surgical history, social history and family history with the patient and they are unchanged from previous note.  ALLERGIES:  is allergic to codeine.  MEDICATIONS:  Current Outpatient Prescriptions  Medication Sig Dispense Refill  . albuterol (PROVENTIL HFA;VENTOLIN HFA) 108 (90 BASE) MCG/ACT inhaler Inhale 2 puffs into the lungs every 6 (six) hours as needed for wheezing.  1 Inhaler  2  . amiodarone (PACERONE) 200 MG tablet Take 1 tablet (200 mg total) by mouth daily.  30 tablet  5  . benzonatate (TESSALON) 100 MG capsule Take 1 capsule (100 mg total) by mouth 2 (two) times daily.  20 capsule  0  . Budesonide (PULMICORT FLEXHALER) 90 MCG/ACT inhaler Inhale 2 puffs into the lungs 2 (two) times daily.  1 Inhaler  3  . carvedilol (COREG) 12.5 MG tablet Take 1 tablet (12.5 mg total) by mouth 2 (two) times daily with a meal.  180 tablet  1  . furosemide (LASIX) 40 MG tablet Take 0.5 tablets (20 mg total) by mouth daily.  30 tablet  1  . warfarin (COUMADIN) 5 MG tablet Take 2.5-5 mg by mouth daily. Take 1/2 tablet (2.5mg ) on Sunday, Monday,  Wednesday, and Friday.  Take 1 tablet (5mg ) on Tuesday, Thursday, and Saturday       No current facility-administered medications for this visit.    REVIEW OF SYSTEMS:   Constitutional: Denies fevers, chills or abnormal weight loss All other systems were reviewed with the patient and are negative.  PHYSICAL EXAMINATION: ECOG PERFORMANCE STATUS: 0 - Asymptomatic  Filed Vitals:   06/26/13 0916  BP: 136/71  Pulse: 67  Temp: 98.8 F (37.1 C)  Resp: 18   Filed Weights   06/26/13 0916  Weight: 183 lb 1.6 oz (83.054 kg)    GENERAL:alert, no distress and comfortable NEURO: alert & oriented x 3 with fluent speech, no focal motor/sensory deficits  LABORATORY DATA:  I have reviewed the data as listed    Component Value Date/Time   NA 143 06/12/2013 1125   NA 140 05/19/2013 0435   K 3.7 06/12/2013 1125   K 4.1 05/19/2013 0435   CL 102 05/19/2013 0435   CO2 32* 06/12/2013 1125   CO2 28 05/19/2013 0435   GLUCOSE 92 06/12/2013 1125   GLUCOSE 148* 05/19/2013 0435   BUN 14.6 06/12/2013 1125   BUN 20 05/19/2013 0435   CREATININE 0.9 06/12/2013 1125   CREATININE 0.72 05/19/2013 0435   CREATININE 0.94 03/24/2013 1409   CALCIUM 8.9 06/12/2013 1125   CALCIUM 9.1 05/19/2013 0435   PROT 6.9 06/12/2013 1125   PROT 7.8 03/20/2013 0815   ALBUMIN 3.3* 06/12/2013 1125   ALBUMIN 3.5 03/20/2013 0815   AST 21 06/12/2013 1125  AST 51* 03/20/2013 0815   ALT 28 06/12/2013 1125   ALT 32 03/20/2013 0815   ALKPHOS 112 06/12/2013 1125   ALKPHOS 126* 03/20/2013 0815   BILITOT 0.82 06/12/2013 1125   BILITOT 0.4 03/20/2013 0815   GFRNONAA >90 05/19/2013 0435   GFRAA >90 05/19/2013 0435    No results found for this basename: SPEP, UPEP,  kappa and lambda light chains    Lab Results  Component Value Date   WBC 10.7* 06/26/2013   NEUTROABS 8.1* 06/26/2013   HGB 15.1 06/26/2013   HCT 44.9 06/26/2013   MCV 97.7 06/26/2013   PLT 279 06/26/2013      Chemistry      Component Value Date/Time   NA 143  06/12/2013 1125   NA 140 05/19/2013 0435   K 3.7 06/12/2013 1125   K 4.1 05/19/2013 0435   CL 102 05/19/2013 0435   CO2 32* 06/12/2013 1125   CO2 28 05/19/2013 0435   BUN 14.6 06/12/2013 1125   BUN 20 05/19/2013 0435   CREATININE 0.9 06/12/2013 1125   CREATININE 0.72 05/19/2013 0435   CREATININE 0.94 03/24/2013 1409      Component Value Date/Time   CALCIUM 8.9 06/12/2013 1125   CALCIUM 9.1 05/19/2013 0435   ALKPHOS 112 06/12/2013 1125   ALKPHOS 126* 03/20/2013 0815   AST 21 06/12/2013 1125   AST 51* 03/20/2013 0815   ALT 28 06/12/2013 1125   ALT 32 03/20/2013 0815   BILITOT 0.82 06/12/2013 1125   BILITOT 0.4 03/20/2013 0815      ASSESSMENT & PLAN:  #1 JAK 2 mutation positive myeloproliferative disorder Repeat CBC today confirmed his hemoglobin is coming down with phlebotomy. The patient is on anticoagulation therapy. I recommend bone marrow aspirate and biopsy to categorize the subtype of myeloproliferative disorder better. He agreed. We discussed some of the risks, benefits, and alternatives of blood transfusions. The patient is symptomatic from anemia and the hemoglobin level is critically low.  Some of the side-effects to be expected including risks of transfusion reactions, chills, infection, syndrome of volume overload and risk of hospitalization from various reasons and the patient is willing to proceed and went ahead to sign consent today.  Bone Marrow Biopsy and Aspiration Procedure Note   Informed consent was obtained and potential risks including bleeding, infection and pain were reviewed with the patient.  The patient's name, date of birth, identification, consent and allergies were verified prior to the start of procedure and time out was performed. The left posterior iliac crest was chosen as the site of biopsy.  The skin was prepped with Betadine solution.   8 cc of 2% lidocaine was used to provide local anaesthesia.   10 cc of bone marrow aspirate was obtained followed  by 1 inch biopsy.   The procedure was tolerated well and there were no complications.  The patient was stable at the end of the procedure.  Specimens sent for flow cytometry, cytogenetics and additional studies.  Orders Placed This Encounter  Procedures  . Biopsy bone marrow    Standing Status: Standing     Number of Occurrences: 1     Standing Expiration Date:   . CBC with Differential    Standing Status: Future     Number of Occurrences: 1     Standing Expiration Date: 03/18/2014  . [id number]    Standing Status: Future     Number of Occurrences: 1     Standing Expiration Date: 06/26/2014  . Genetic/FISH  testing, @[facility]   (manual req required)    MUST SCHEDULE SPECIMEN COLLECTION WITH FLOW CYTOMETRY LAB STAFF ([id number])    Standing Status: Standing     Number of Occurrences: 1     Standing Expiration Date:   . Obtain Consent    Standing Status: Standing     Number of Occurrences: 1     Standing Expiration Date:     Order Specific Question:  Procedure    Answer:  bone marrow biopsy    Order Specific Question:  Surgeon    Answer:  Bertis Ruddy    Order Specific Question:  Indication/Reason    Answer:  erythrocytosis   All questions were answered. The patient knows to call the clinic with any problems, questions or concerns. No barriers to learning was detected.    Donnajean Chesnut, MD 06/26/2013 11:41 AM

## 2013-06-26 NOTE — Telephone Encounter (Signed)
Gav appt for MD only , sent pt to labs today, bobe marrow today @11  am pt aware

## 2013-06-26 NOTE — Patient Instructions (Signed)
Bone Marrow Aspiration, Bone Marrow Biopsy  Care After  Read the instructions outlined below and refer to this sheet in the next few weeks. These discharge instructions provide you with general information on caring for yourself after you leave the hospital. Your caregiver may also give you specific instructions. While your treatment has been planned according to the most current medical practices available, unavoidable complications occasionally occur. If you have any problems or questions after discharge, call your caregiver.  FINDING OUT THE RESULTS OF YOUR TEST  Not all test results are available during your visit. If your test results are not back during the visit, make an appointment with your caregiver to find out the results. Do not assume everything is normal if you have not heard from your caregiver or the medical facility. It is important for you to follow up on all of your test results.   HOME CARE INSTRUCTIONS   You have had sedation and may be sleepy or dizzy. Your thinking may not be as clear as usual. For the next 24 hours:   Only take over-the-counter or prescription medicines for pain, discomfort, and or fever as directed by your caregiver.   Do not drink alcohol.   Do not smoke.   Do not drive.   Do not make important legal decisions.   Do not operate heavy machinery.   Do not care for small children by yourself.   Keep your dressing clean and dry. You may replace dressing with a bandage after 24 hours.   You may take a bath or shower after 24 hours.   Use an ice pack for 20 minutes every 2 hours while awake for pain as needed.  SEEK MEDICAL CARE IF:    There is redness, swelling, or increasing pain at the biopsy site.   There is pus coming from the biopsy site.   There is drainage from a biopsy site lasting longer than one day.   An unexplained oral temperature above 102 F (38.9 C) develops.  SEEK IMMEDIATE MEDICAL CARE IF:    You develop a rash.   You have difficulty  breathing.   You develop any reaction or side effects to medications given.  Document Released: 01/23/2005 Document Revised: 09/28/2011 Document Reviewed: 07/03/2008  ExitCare Patient Information 2014 [employer].

## 2013-07-10 ENCOUNTER — Ambulatory Visit: Payer: Medicare Other | Admitting: Hematology and Oncology

## 2013-07-11 ENCOUNTER — Encounter: Payer: Self-pay | Admitting: *Deleted

## 2013-07-17 ENCOUNTER — Ambulatory Visit (INDEPENDENT_AMBULATORY_CARE_PROVIDER_SITE_OTHER): Payer: Medicare Other | Admitting: Pharmacist

## 2013-07-17 DIAGNOSIS — Z7901 Long term (current) use of anticoagulants: Secondary | ICD-10-CM

## 2013-07-17 DIAGNOSIS — Z5181 Encounter for therapeutic drug level monitoring: Secondary | ICD-10-CM

## 2013-07-17 DIAGNOSIS — I4891 Unspecified atrial fibrillation: Secondary | ICD-10-CM

## 2013-07-17 MED ORDER — WARFARIN SODIUM 5 MG PO TABS: ORAL_TABLET | ORAL | Status: DC

## 2013-07-17 NOTE — Progress Notes (Signed)
Anti-Coagulation Progress Note  [name] is a 67 y.o. male who is currently on an anti-coagulation regimen.    RECENT RESULTS: Recent results are below, the most recent result is correlated with a dose of 22.5 mg. per week: Lab Results  Component Value Date   INR 2.1 07/17/2013   INR 2.80 06/12/2013   INR 4.40 06/05/2013    ANTI-COAG DOSE: Anticoagulation Dose Instructions as of 07/17/2013     Glynis Smiles Tue Wed Thu Fri Sat   New Dose 2.5 mg 5 mg 5 mg 2.5 mg 5 mg 2.5 mg 2.5 mg       ANTICOAG SUMMARY: Anticoagulation Episode Summary   Current INR goal 2.0-3.0  Next INR check 08/14/2013  INR from last check 2.1 (07/17/2013)  Weekly max dose   Target end date Indefinite  [facility] check location [facility]  Preferred lab   Send INR reminders to    Indications  Atrial fibrillation [427.31] Long term (current) use of anticoagulants [V58.61]        Comments         ANTICOAG TODAY: Anticoagulation Summary as of 07/17/2013   INR goal 2.0-3.0  Selected INR 2.1 (07/17/2013)  Next INR check 08/14/2013  Target end date Indefinite   Indications  Atrial fibrillation [427.31] Long term (current) use of anticoagulants [V58.61]      Anticoagulation Episode Summary   [facility]R check location [facility]   Preferred lab    Send INR reminders to    Comments       PATIENT INSTRUCTIONS: Patient Instructions  Patient instructed to take medications as defined in the Anti-coagulation Track section of this encounter.  Patient instructed to take today's dose.  Patient verbalized understanding of these instructions.       FOLLOW-UP Return in 4 weeks (on 08/14/2013) for Follow up INR at 1130h.  Hulen Luster, III Pharm.D., CACP

## 2013-07-17 NOTE — Patient Instructions (Signed)
Patient instructed to take medications as defined in the Anti-coagulation Track section of this encounter.  Patient instructed to take today's dose.  Patient verbalized understanding of these instructions.    

## 2013-07-27 ENCOUNTER — Telehealth: Payer: Self-pay | Admitting: Hematology and Oncology

## 2013-07-27 NOTE — Telephone Encounter (Signed)
pt called to r/s 12/22 appt. pt given new appt for 1/12.

## 2013-07-31 ENCOUNTER — Ambulatory Visit (HOSPITAL_BASED_OUTPATIENT_CLINIC_OR_DEPARTMENT_OTHER): Payer: Medicare Other | Admitting: Hematology and Oncology

## 2013-07-31 ENCOUNTER — Telehealth: Payer: Self-pay | Admitting: Hematology and Oncology

## 2013-07-31 ENCOUNTER — Encounter: Payer: Self-pay | Admitting: Hematology and Oncology

## 2013-07-31 VITALS — BP 147/80 | HR 59 | Temp 99.7°F | Resp 19 | Ht 65.5 in | Wt 187.9 lb

## 2013-07-31 DIAGNOSIS — D47Z9 Other specified neoplasms of uncertain behavior of lymphoid, hematopoietic and related tissue: Secondary | ICD-10-CM

## 2013-07-31 DIAGNOSIS — F17200 Nicotine dependence, unspecified, uncomplicated: Secondary | ICD-10-CM

## 2013-07-31 DIAGNOSIS — D751 Secondary polycythemia: Secondary | ICD-10-CM

## 2013-07-31 MED ORDER — HYDROXYUREA 500 MG PO CAPS: 500.0000 mg | ORAL_CAPSULE | Freq: Every day | ORAL | Status: DC

## 2013-07-31 NOTE — Progress Notes (Signed)
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 In November 2014, we removed one unit of blood and order an additional workup to rule out myeloproliferative disorder. Blood test result confirmed low erythropoietin level along with detectable JAK 2 mutation, confirmed myeloproliferative disorder Unfortunately, bone marrow biopsy on 06/27/2039 was nondiagnostic INTERVAL HISTORY: [name] 68 y.o. male returns for further followup. He is attempting to quit smoking, down to one cigarette a day. Denies any chest pain, dizziness or leg cramps.  I have reviewed the past medical history, past surgical history, social history and family history with the patient and they are unchanged from previous note.  ALLERGIES:  is allergic to codeine.  MEDICATIONS:  Current Outpatient Prescriptions  Medication Sig Dispense Refill  . albuterol (PROVENTIL HFA;VENTOLIN HFA) 108 (90 BASE) MCG/ACT inhaler Inhale 2 puffs into the lungs every 6 (six) hours as needed for wheezing.  1 Inhaler  2  . amiodarone (PACERONE) 200 MG tablet Take 1 tablet (200 mg total) by mouth daily.  30 tablet  5  . benzonatate (TESSALON) 100 MG capsule Take 1 capsule (100 mg total) by mouth 2 (two) times daily.  20 capsule  0  . Budesonide (PULMICORT FLEXHALER) 90 MCG/ACT inhaler Inhale 2 puffs into the lungs 2 (two) times daily.  1 Inhaler  3  . carvedilol (COREG) 12.5 MG tablet Take 1 tablet (12.5 mg total) by mouth 2 (two) times daily with a meal.  180 tablet  1  . furosemide (LASIX) 40 MG tablet Take 0.5 tablets (20 mg total) by mouth daily.  30 tablet  1  . warfarin (COUMADIN) 5  MG tablet Take 1/2 tablet (2.23m) on Sunday, Wednesday,Friday and Saturday. Take 1 tablet (569m on Monday, Tuesday and Thursday.  30 tablet  2  . hydroxyurea (HYDREA) 500 MG capsule Take 1 capsule (500 mg total) by mouth daily. May take with food to minimize GI side effects.  30 capsule  5   No current facility-administered medications for this visit.    REVIEW OF SYSTEMS:   Behavioral/Psych: Mood is stable, no new changes  All other systems were reviewed with the patient and are negative.  PHYSICAL EXAMINATION: ECOG PERFORMANCE STATUS: 0 - Asymptomatic  Filed Vitals:   07/31/13 1323  BP: 147/80  Pulse: 59  Temp: 99.7 F (37.6 C)  Resp: 19   Filed Weights   07/31/13 1323  Weight: 187 lb 14.4 oz (85.231 kg)    GENERAL:alert, no distress and comfortable. He looks a bit plethoric Musculoskeletal:no cyanosis of digits and no clubbing  NEURO: alert & oriented x 3 with fluent speech, no focal motor/sensory deficits  LABORATORY DATA:  I have reviewed the data as listed    Component Value Date/Time   NA 143 06/12/2013 1125   NA 140 05/19/2013 0435   K 3.7 06/12/2013 1125   K 4.1 05/19/2013 0435   CL 102 05/19/2013 0435   CO2 32* 06/12/2013 1125   CO2 28 05/19/2013 0435   GLUCOSE 92 06/12/2013 1125   GLUCOSE 148* 05/19/2013 0435   BUN 14.6 06/12/2013 1125   BUN 20 05/19/2013 0435   CREATININE 0.9 06/12/2013 1125   CREATININE 0.72 05/19/2013 0435  CREATININE 0.94 03/24/2013 1409   CALCIUM 8.9 06/12/2013 1125   CALCIUM 9.1 05/19/2013 0435   PROT 6.9 06/12/2013 1125   PROT 7.8 03/20/2013 0815   ALBUMIN 3.3* 06/12/2013 1125   ALBUMIN 3.5 03/20/2013 0815   AST 21 06/12/2013 1125   AST 51* 03/20/2013 0815   ALT 28 06/12/2013 1125   ALT 32 03/20/2013 0815   ALKPHOS 112 06/12/2013 1125   ALKPHOS 126* 03/20/2013 0815   BILITOT 0.82 06/12/2013 1125   BILITOT 0.4 03/20/2013 0815   GFRNONAA >90 05/19/2013 0435   GFRAA >90 05/19/2013 0435    No results found for this basename: SPEP,   UPEP,   kappa and lambda light chains    Lab Results  Component Value Date   WBC 10.7* 06/26/2013   NEUTROABS 8.1* 06/26/2013   HGB 15.2 06/26/2013   HCT 46.1 06/26/2013   MCV 98.9 06/26/2013   PLT 314 06/26/2013      Chemistry      Component Value Date/Time   NA 143 06/12/2013 1125   NA 140 05/19/2013 0435   K 3.7 06/12/2013 1125   K 4.1 05/19/2013 0435   CL 102 05/19/2013 0435   CO2 32* 06/12/2013 1125   CO2 28 05/19/2013 0435   BUN 14.6 06/12/2013 1125   BUN 20 05/19/2013 0435   CREATININE 0.9 06/12/2013 1125   CREATININE 0.72 05/19/2013 0435   CREATININE 0.94 03/24/2013 1409      Component Value Date/Time   CALCIUM 8.9 06/12/2013 1125   CALCIUM 9.1 05/19/2013 0435   ALKPHOS 112 06/12/2013 1125   ALKPHOS 126* 03/20/2013 0815   AST 21 06/12/2013 1125   AST 51* 03/20/2013 0815   ALT 28 06/12/2013 1125   ALT 32 03/20/2013 0815   BILITOT 0.82 06/12/2013 1125   BILITOT 0.4 03/20/2013 0815     ASSESSMENT & PLAN:  #1 myeloproliferative disorder Suspect he might have polycythemia vera. I discussed with him risks, benefits and side effects of taking hydroxyurea including potential risk of fatigue, pancytopenia, risk of infection. The patient is in agreement to proceed. I will start him on one hydroxyurea per day. I will see him back in 2 weeks with followup blood test result #2 tobacco use I spent some time educating him the importance of nicotine cessation. The patient will attempt to quit himself  Orders Placed This Encounter  Procedures  . CBC with Differential    Standing Status: Future     Number of Occurrences:      Standing Expiration Date: 04/22/2014   All questions were answered. The patient knows to call the clinic with any problems, questions or concerns. No barriers to learning was detected. I spent 15 minutes counseling the patient face to face. The total time spent in the appointment was 20 minutes and more than 50% was on counseling and review of test results      [facility], [place], MD 07/31/2013 3:04 PM

## 2013-07-31 NOTE — Telephone Encounter (Signed)
gv and printed appt sched and avs for pt for Jan 2015 °

## 2013-07-31 NOTE — Patient Instructions (Signed)
Hydroxyurea capsules What is this medicine? HYDROXYUREA (hye drox ee yoor EE a) is a chemotherapy drug. It slows the growth of cancer cells. This medicine is used to treat certain leukemias, skin cancer, head and neck cancer, and advanced ovarian cancer. It is also used to control the painful crises of sickle cell anemia. This medicine may be used for other purposes; ask your health care provider or pharmacist if you have questions. COMMON BRAND NAME(S): Droxia, Hydrea What should I tell my health care provider before I take this medicine? They need to know if you have any of these conditions: -immune system problems -infection (especially a virus infection such as chickenpox, cold sores, or herpes) -kidney disease -low blood counts, like low white cell, platelet, or red cell counts -previous or ongoing radiation therapy -an unusual or allergic reaction to hydroxyurea, other chemotherapy, other medicines, foods, dyes, or preservatives -pregnant or trying to get pregnant -breast-feeding How should I use this medicine? Take this medicine by mouth with a glass of water. Follow the directions on the prescription label. Take your medicine at regular intervals. Do not take it more often than directed. Do not stop taking except on your doctor's advice. People who are not taking this medicine should not be exposed to it. Wash your hands before and after handling your bottle or medicine. Caregivers should wear disposable gloves if they must touch the bottle or medicine. Clean up any medicine powder that spills with a damp disposable towel and throw the towel away in a closed container, such as a plastic bag. Talk to your pediatrician regarding the use of this medicine in children. Special care may be needed. Patients over 65 years old may have a stronger reaction and need a smaller dose. Overdosage: If you think you have taken too much of this medicine contact a poison control center or emergency room at  once. NOTE: This medicine is only for you. Do not share this medicine with others. What if I miss a dose? If you miss a dose, take it as soon as you can. If it is almost time for your next dose, take only that dose. Do not take double or extra doses. What may interact with this medicine? -didanosine -other chemotherapy agents -stavudine -tenofovir -vaccines This list may not describe all possible interactions. Give your health care provider a list of all the medicines, herbs, non-prescription drugs, or dietary supplements you use. Also tell them if you smoke, drink alcohol, or use illegal drugs. Some items may interact with your medicine. What should I watch for while using this medicine? This drug may make you feel generally unwell. This is not uncommon, as chemotherapy can affect healthy cells as well as cancer cells. Report any side effects. Continue your course of treatment even though you feel ill unless your doctor tells you to stop. You will receive regular blood tests during your treatment. Call your doctor or health care professional for advice if you get a fever, chills or sore throat, or other symptoms of a cold or flu. Do not treat yourself. This drug decreases your body's ability to fight infections. Try to avoid being around people who are sick. This medicine may increase your risk to bruise or bleed. Call your doctor or health care professional if you notice any unusual bleeding. Be careful brushing and flossing your teeth or using a toothpick because you may get an infection or bleed more easily. If you have any dental work done, tell your dentist you are   receiving this medicine. Avoid taking products that contain aspirin, acetaminophen, ibuprofen, naproxen, or ketoprofen unless instructed by your doctor. These medicines may hide a fever. Do not become pregnant while taking this medicine. Women should inform their doctor if they wish to become pregnant or think they might be  pregnant. There is a potential for serious side effects to an unborn child. Men should inform their doctors if they wish to father a child. This medicine may lower sperm counts. Talk to your health care professional or pharmacist for more information. Do not breast-feed an infant while taking this medicine. What side effects may I notice from receiving this medicine? Side effects that you should report to your doctor or health care professional as soon as possible: -allergic reactions like skin rash, itching or hives, swelling of the face, lips, or tongue -low blood counts - this medicine may decrease the number of white blood cells, red blood cells and platelets. You may be at increased risk for infections and bleeding. -signs of infection - fever or chills, cough, sore throat, pain or difficulty passing urine -signs of decreased platelets or bleeding - bruising, pinpoint red spots on the skin, black, tarry stools, blood in the urine -signs of decreased red blood cells - unusually weak or tired, fainting spells, lightheadedness -breathing problems -burning, redness or pain at the site of any radiation therapy -changes in skin color -confusion -mouth sores -pain, tingling, numbness in the hands or feet -seizures -skin ulcers -trouble passing urine or change in the amount of urine -vomiting Side effects that usually do not require medical attention (report to your doctor or health care professional if they continue or are bothersome): -headache -loss of appetite -red color to the face This list may not describe all possible side effects. Call your doctor for medical advice about side effects. You may report side effects to FDA at 1-800-FDA-1088. Where should I keep my medicine? Keep out of the reach of children. Store at room temperature between 15 and 30 degrees C (59 and 86 degrees F). Keep tightly closed. Throw away any unused medicine after the expiration date. NOTE: This sheet is a  summary. It may not cover all possible information. If you have questions about this medicine, talk to your doctor, pharmacist, or health care provider.  2014, Elsevier/Gold Standard. (2007-11-18 15:03:29)  

## 2013-08-14 ENCOUNTER — Other Ambulatory Visit: Payer: Self-pay | Admitting: *Deleted

## 2013-08-14 ENCOUNTER — Ambulatory Visit (INDEPENDENT_AMBULATORY_CARE_PROVIDER_SITE_OTHER): Payer: Medicare Other | Admitting: Pharmacist

## 2013-08-14 DIAGNOSIS — Z7901 Long term (current) use of anticoagulants: Secondary | ICD-10-CM

## 2013-08-14 DIAGNOSIS — I4891 Unspecified atrial fibrillation: Secondary | ICD-10-CM

## 2013-08-14 LAB — POCT INR: INR: 2.9

## 2013-08-14 NOTE — Patient Instructions (Signed)
Patient instructed to take medications as defined in the Anti-coagulation Track section of this encounter.  Patient instructed to take today's dose.  Patient verbalized understanding of these instructions.    

## 2013-08-14 NOTE — Progress Notes (Signed)
Anti-Coagulation Progress Note  [name] is a 68 y.o. male who is currently on an anti-coagulation regimen.    RECENT RESULTS: Recent results are below, the most recent result is correlated with a dose of 25 mg. per week: Lab Results  Component Value Date   INR 2.90 08/14/2013   INR 2.1 07/17/2013   INR 2.80 06/12/2013    ANTI-COAG DOSE: Anticoagulation Dose Instructions as of 08/14/2013     [name] Tue Wed Thu Fri Sat   New Dose 2.5 mg 5 mg 5 mg 2.5 mg 5 mg 2.5 mg 2.5 mg       ANTICOAG SUMMARY: Anticoagulation Episode Summary   Current INR goal 2.0-3.0  Next INR check 09/11/2013  INR from last check 2.90 (08/14/2013)  Weekly max dose   Target end date Indefinite  [facility]R check location [facility]  Preferred lab   Send INR reminders to    Indications  Atrial fibrillation [427.31] Long term (current) use of anticoagulants [V58.61]        Comments         ANTICOAG TODAY: Anticoagulation Summary as of 08/14/2013   INR goal 2.0-3.0  Selected INR 2.90 (08/14/2013)  Next INR check 09/11/2013  Target end date Indefinite   Indications  Atrial fibrillation [427.31] Long term (current) use of anticoagulants [V58.61]      Anticoagulation Episode Summary   [facility]R check location [facility]   Preferred lab    Send INR reminders to    Comments       PATIENT INSTRUCTIONS: Patient Instructions  Patient instructed to take medications as defined in the Anti-coagulation Track section of this encounter.  Patient instructed to take today's dose.  Patient verbalized understanding of these instructions.       FOLLOW-UP Return in 4 weeks (on 09/11/2013) for Follow up INR at 1000h.  [name], III Pharm.D., CACP

## 2013-08-15 ENCOUNTER — Encounter: Payer: Self-pay | Admitting: Hematology and Oncology

## 2013-08-15 ENCOUNTER — Other Ambulatory Visit (HOSPITAL_BASED_OUTPATIENT_CLINIC_OR_DEPARTMENT_OTHER): Payer: Medicare Other

## 2013-08-15 ENCOUNTER — Telehealth: Payer: Self-pay | Admitting: Hematology and Oncology

## 2013-08-15 ENCOUNTER — Ambulatory Visit (HOSPITAL_BASED_OUTPATIENT_CLINIC_OR_DEPARTMENT_OTHER): Payer: Medicare Other | Admitting: Hematology and Oncology

## 2013-08-15 ENCOUNTER — Ambulatory Visit (HOSPITAL_BASED_OUTPATIENT_CLINIC_OR_DEPARTMENT_OTHER): Payer: Medicare Other

## 2013-08-15 VITALS — BP 127/74 | HR 49 | Temp 98.1°F | Resp 20 | Ht 65.5 in | Wt 175.2 lb

## 2013-08-15 VITALS — BP 110/57 | HR 46 | Temp 98.1°F

## 2013-08-15 DIAGNOSIS — D751 Secondary polycythemia: Secondary | ICD-10-CM

## 2013-08-15 DIAGNOSIS — D45 Polycythemia vera: Secondary | ICD-10-CM

## 2013-08-15 DIAGNOSIS — D471 Chronic myeloproliferative disease: Secondary | ICD-10-CM | POA: Insufficient documentation

## 2013-08-15 DIAGNOSIS — F17200 Nicotine dependence, unspecified, uncomplicated: Secondary | ICD-10-CM

## 2013-08-15 HISTORY — DX: Chronic myeloproliferative disease: D47.1

## 2013-08-15 LAB — CBC WITH DIFFERENTIAL/PLATELET
BASO%: 0.6 % (ref 0.0–2.0)
Basophils Absolute: 0.1 10*3/uL (ref 0.0–0.1)
EOS%: 5 % (ref 0.0–7.0)
Eosinophils Absolute: 0.5 10*3/uL (ref 0.0–0.5)
HCT: 55.6 % — ABNORMAL HIGH (ref 38.4–49.9)
HGB: 18.4 g/dL — ABNORMAL HIGH (ref 13.0–17.1)
LYMPH%: 12.1 % — AB (ref 14.0–49.0)
MCH: 33 pg (ref 27.2–33.4)
MCHC: 33.1 g/dL (ref 32.0–36.0)
MCV: 99.8 fL — ABNORMAL HIGH (ref 79.3–98.0)
MONO#: 0.6 10*3/uL (ref 0.1–0.9)
MONO%: 5.9 % (ref 0.0–14.0)
NEUT#: 7.7 10*3/uL — ABNORMAL HIGH (ref 1.5–6.5)
NEUT%: 76.4 % — ABNORMAL HIGH (ref 39.0–75.0)
PLATELETS: 219 10*3/uL (ref 140–400)
RBC: 5.57 10*6/uL (ref 4.20–5.82)
RDW: 16.8 % — AB (ref 11.0–14.6)
WBC: 10.1 10*3/uL (ref 4.0–10.3)
lymph#: 1.2 10*3/uL (ref 0.9–3.3)

## 2013-08-15 MED ORDER — FUROSEMIDE 40 MG PO TABS: 20.0000 mg | ORAL_TABLET | Freq: Every day | ORAL | Status: DC

## 2013-08-15 NOTE — Telephone Encounter (Signed)
gv and printed appt sched and avs for pt for Feb  °

## 2013-08-15 NOTE — Progress Notes (Signed)
Therapeutic phlebotomy performed per order without difficulty lasting from 1105-1120 which yielded 530 g blood or 1 unit. Patient tolerated well, gauze pressure dressing applied and snack provided. Patient to be monitored M41 minutes per policy and discharged upon stable vs.

## 2013-08-15 NOTE — Patient Instructions (Signed)
Therapeutic Phlebotomy Therapeutic phlebotomy is the controlled removal of blood from your body for the purpose of treating a medical condition. It is similar to donating blood. Usually, about a pint (470 mL) of blood is removed. The average adult has 9 to 12 pints (4.3 to 5.7 L) of blood. Therapeutic phlebotomy may be used to treat the following medical conditions:  Hemochromatosis. This is a condition in which there is too much iron in the blood.  Polycythemia vera. This is a condition in which there are too many red cells in the blood.  Porphyria cutanea tarda. This is a disease usually passed from one generation to the next (inherited). It is a condition in which an important part of hemoglobin is not made properly. This results in the build up of abnormal amounts of porphyrins in the body.  Sickle cell disease. This is an inherited disease. It is a condition in which the red blood cells form an abnormal crescent shape rather than a round shape. LET YOUR CAREGIVER KNOW ABOUT:  Allergies.  Medicines taken including herbs, eyedrops, over-the-counter medicines, and creams.  Use of steroids (by mouth or creams).  Previous problems with anesthetics or numbing medicine.  History of blood clots.  History of bleeding or blood problems.  Previous surgery.  Possibility of pregnancy, if this applies. RISKS AND COMPLICATIONS This is a simple and safe procedure. Problems are unlikely. However, problems can occur and may include:  Nausea or lightheadedness.  Low blood pressure.  Soreness, bleeding, swelling, or bruising at the needle insertion site.  Infection. BEFORE THE PROCEDURE  This is a procedure that can be done as an outpatient. Confirm the time that you need to arrive for your procedure. Confirm whether there is a need to fast or withhold any medications. It is helpful to wear clothing with sleeves that can be raised above the elbow. A blood sample may be done to determine the  amount of red blood cells or iron in your blood. Plan ahead of time to have someone drive you home after the procedure. PROCEDURE The entire procedure from preparation through recovery takes about 1 hour. The actual collection takes about 10 to 15 minutes.  A needle will be inserted into your vein.  Tubing and a collection bag will be attached to that needle.  Blood will flow through the needle and tubing into the collection bag.  You may be asked to open and close your hand slowly and continuously during the entire collection.  Once the specified amount of blood has been removed from your body, the collection bag and tubing will be clamped.  The needle will be removed.  Pressure will be held on the site of the needle insertion to stop the bleeding. Then a bandage will be placed over the needle insertion site. AFTER THE PROCEDURE  Your recovery will be assessed and monitored. If there are no problems, as an outpatient, you should be able to go home shortly after the procedure.  Document Released: 12/08/2010 Document Revised: 09/28/2011 Document Reviewed: 12/08/2010 [employer] Patient Information 2014 [employer].  

## 2013-08-15 NOTE — Progress Notes (Signed)
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 In November 2014, we removed one unit of blood and order an additional workup to rule out myeloproliferative disorder. Blood test result confirmed low erythropoietin level along with detectable JAK 2 mutation, confirmed myeloproliferative disorder Unfortunately, bone marrow biopsy on 06/26/2013 was nondiagnostic On 07/31/13 the patient was started on 1 hydroxyurea a day On 08/15/2013, increased hydroxyurea to 2 tablets a day and remove one more unit of blood due to the high hemoglobin  INTERVAL HISTORY: [name] 68 y.o. male returns for further followup. He is attempting to quit smoking, down to one cigarette a day. He denies any chest pain, shortness of breath or leg cramps. He denies any side effects from hydroxyurea. He denies any recent fever, chills, night sweats or abnormal weight loss   I have reviewed the past medical history, past surgical history, social history and family history with the patient and they are unchanged from previous note.  ALLERGIES:  is allergic to codeine.  MEDICATIONS:  Current Outpatient Prescriptions  Medication Sig Dispense Refill  . albuterol (PROVENTIL HFA;VENTOLIN HFA) 108 (90 BASE) MCG/ACT inhaler Inhale 2 puffs into the lungs every 6 (six) hours as needed for wheezing.  1 Inhaler  2  . amiodarone (PACERONE) 200 MG tablet Take 1 tablet (200 mg total) by mouth daily.  30 tablet  5  . benzonatate (TESSALON) 100 MG capsule Take 1 capsule (100 mg total) by mouth 2 (two) times daily.  20 capsule  0  . Budesonide (PULMICORT FLEXHALER) 90 MCG/ACT inhaler Inhale 2  puffs into the lungs 2 (two) times daily.  1 Inhaler  3  . carvedilol (COREG) 12.5 MG tablet Take 1 tablet (12.5 mg total) by mouth 2 (two) times daily with a meal.  180 tablet  1  . furosemide (LASIX) 40 MG tablet Take 0.5 tablets (20 mg total) by mouth daily.  30 tablet  1  . hydroxyurea (HYDREA) 500 MG capsule Take 1 capsule (500 mg total) by mouth daily. May take with food to minimize GI side effects.  30 capsule  5  . warfarin (COUMADIN) 5 MG tablet Take 1/2 tablet (2.$RemoveBefor'5mg'TchowdTFgFqO$ ) on Sunday, Wednesday,Friday and Saturday. Take 1 tablet ($RemoveB'5mg'RsMeQIKZ$ ) on Monday, Tuesday and Thursday.  30 tablet  2   No current facility-administered medications for this visit.    REVIEW OF SYSTEMS:   Constitutional: Denies fevers, chills or abnormal weight loss Eyes: Denies blurriness of vision Ears, nose, mouth, throat, and face: Denies mucositis or sore throat Respiratory: Denies cough, dyspnea or wheezes Cardiovascular: Denies palpitation, chest discomfort or lower extremity swelling Gastrointestinal:  Denies nausea, heartburn or change in bowel habits Skin: Denies abnormal skin rashes Lymphatics: Denies new lymphadenopathy or easy bruising Neurological:Denies numbness, tingling or new weaknesses Behavioral/Psych: Mood is stable, no new changes  All other systems were reviewed with the patient and are negative.  PHYSICAL EXAMINATION: ECOG PERFORMANCE STATUS: 0 - Asymptomatic  Filed Vitals:   08/15/13 1026  BP: 127/74  Pulse: 49  Temp: 98.1 F (36.7 C)  Resp: 20   Filed Weights   08/15/13 1026  Weight: 175 lb 3.2 oz (79.47 kg)    GENERAL:alert, no distress and comfortable.  He is moderately obese. He looks plethoric SKIN: skin color, texture, turgor are normal, no rashes or significant lesions EYES: normal, Conjunctiva are pink and non-injected, sclera clear OROPHARYNX:no exudate, no erythema and lips, buccal mucosa, and tongue normal  NECK: supple, thyroid normal size, non-tender, without  nodularity LYMPH:  no palpable lymphadenopathy in the cervical, axillary or inguinal LUNGS: clear to auscultation and percussion with normal breathing effort HEART: regular rate & rhythm and no murmurs and no lower extremity edema ABDOMEN:abdomen soft, non-tender and normal bowel sounds Musculoskeletal:no cyanosis of digits and no clubbing  NEURO: alert & oriented x 3 with fluent speech, no focal motor/sensory deficits  LABORATORY DATA:  I have reviewed the data as listed    Component Value Date/Time   NA 143 06/12/2013 1125   NA 140 05/19/2013 0435   K 3.7 06/12/2013 1125   K 4.1 05/19/2013 0435   CL 102 05/19/2013 0435   CO2 32* 06/12/2013 1125   CO2 28 05/19/2013 0435   GLUCOSE 92 06/12/2013 1125   GLUCOSE 148* 05/19/2013 0435   BUN 14.6 06/12/2013 1125   BUN 20 05/19/2013 0435   CREATININE 0.9 06/12/2013 1125   CREATININE 0.72 05/19/2013 0435   CREATININE 0.94 03/24/2013 1409   CALCIUM 8.9 06/12/2013 1125   CALCIUM 9.1 05/19/2013 0435   PROT 6.9 06/12/2013 1125   PROT 7.8 03/20/2013 0815   ALBUMIN 3.3* 06/12/2013 1125   ALBUMIN 3.5 03/20/2013 0815   AST 21 06/12/2013 1125   AST 51* 03/20/2013 0815   ALT 28 06/12/2013 1125   ALT 32 03/20/2013 0815   ALKPHOS 112 06/12/2013 1125   ALKPHOS 126* 03/20/2013 0815   BILITOT 0.82 06/12/2013 1125   BILITOT 0.4 03/20/2013 0815   GFRNONAA >90 05/19/2013 0435   GFRAA >90 05/19/2013 0435    No results found for this basename: SPEP,  UPEP,   kappa and lambda light chains    Lab Results  Component Value Date   WBC 10.1 08/15/2013   NEUTROABS 7.7* 08/15/2013   HGB 18.4* 08/15/2013   HCT 55.6* 08/15/2013   MCV 99.8* 08/15/2013   PLT 219 08/15/2013      Chemistry      Component Value Date/Time   NA 143 06/12/2013 1125   NA 140 05/19/2013 0435   K 3.7 06/12/2013 1125   K 4.1 05/19/2013 0435   CL 102 05/19/2013 0435   CO2 32* 06/12/2013 1125   CO2 28 05/19/2013 0435   BUN 14.6 06/12/2013 1125   BUN 20 05/19/2013 0435   CREATININE 0.9  06/12/2013 1125   CREATININE 0.72 05/19/2013 0435   CREATININE 0.94 03/24/2013 1409      Component Value Date/Time   CALCIUM 8.9 06/12/2013 1125   CALCIUM 9.1 05/19/2013 0435   ALKPHOS 112 06/12/2013 1125   ALKPHOS 126* 03/20/2013 0815   AST 21 06/12/2013 1125   AST 51* 03/20/2013 0815   ALT 28 06/12/2013 1125   ALT 32 03/20/2013 0815   BILITOT 0.82 06/12/2013 1125   BILITOT 0.4 03/20/2013 0815      ASSESSMENT & PLAN:  #1 polycythemia vera Due to very high hemoglobin, I plan to order another unit of phlebotomy today and increase hydroxyurea to 2 tablets a day. In the meantime, he will continue taking aspirin daily to prevent risk of thrombosis. #2 tobacco abuse I spent time educating the patient and the importance of nicotine cessation and he is attempting to quit on his own.  Orders Placed This Encounter  Procedures  .  CBC with Differential    Standing Status: Future     Number of Occurrences:      Standing Expiration Date: 08/15/2014  . Perform theraputic phlebotomy    1 unit or about 500 cc of blood removed    Standing Status: Standing     Number of Occurrences: 1     Standing Expiration Date:    All questions were answered. The patient knows to call the clinic with any problems, questions or concerns. No barriers to learning was detected. I spent 25 minutes counseling the patient face to face. The total time spent in the appointment was 40 minutes and more than 50% was on counseling and review of test results     [facility], [place], MD 08/15/2013 7:17 PM

## 2013-08-16 NOTE — Progress Notes (Signed)
Indication: Atrial fibrillation.  Duration: Lifelong.  INR: At target.  Agree with Dr. Gladstone Pih assessment and plan.

## 2013-08-22 ENCOUNTER — Emergency Department (HOSPITAL_COMMUNITY): Payer: Medicare Other

## 2013-08-22 ENCOUNTER — Observation Stay (HOSPITAL_COMMUNITY)
Admission: EM | Admit: 2013-08-22 | Discharge: 2013-08-24 | Disposition: A | Discharge status: Home / Self Care | Payer: Medicare Other | Attending: Internal Medicine | Admitting: Internal Medicine

## 2013-08-22 ENCOUNTER — Encounter (HOSPITAL_COMMUNITY): Payer: Self-pay | Admitting: Emergency Medicine

## 2013-08-22 DIAGNOSIS — I4891 Unspecified atrial fibrillation: Secondary | ICD-10-CM | POA: Diagnosis not present

## 2013-08-22 DIAGNOSIS — D751 Secondary polycythemia: Secondary | ICD-10-CM | POA: Diagnosis not present

## 2013-08-22 DIAGNOSIS — R55 Syncope and collapse: Secondary | ICD-10-CM | POA: Diagnosis present

## 2013-08-22 DIAGNOSIS — E079 Disorder of thyroid, unspecified: Secondary | ICD-10-CM | POA: Insufficient documentation

## 2013-08-22 DIAGNOSIS — F1011 Alcohol abuse, in remission: Secondary | ICD-10-CM | POA: Diagnosis not present

## 2013-08-22 DIAGNOSIS — I5032 Chronic diastolic (congestive) heart failure: Secondary | ICD-10-CM | POA: Insufficient documentation

## 2013-08-22 DIAGNOSIS — I10 Essential (primary) hypertension: Secondary | ICD-10-CM | POA: Diagnosis not present

## 2013-08-22 DIAGNOSIS — F17200 Nicotine dependence, unspecified, uncomplicated: Secondary | ICD-10-CM | POA: Diagnosis not present

## 2013-08-22 DIAGNOSIS — F1110 Opioid abuse, uncomplicated: Secondary | ICD-10-CM | POA: Diagnosis not present

## 2013-08-22 DIAGNOSIS — Z7901 Long term (current) use of anticoagulants: Secondary | ICD-10-CM | POA: Diagnosis not present

## 2013-08-22 DIAGNOSIS — D72829 Elevated white blood cell count, unspecified: Secondary | ICD-10-CM | POA: Diagnosis not present

## 2013-08-22 DIAGNOSIS — D47Z9 Other specified neoplasms of uncertain behavior of lymphoid, hematopoietic and related tissue: Secondary | ICD-10-CM | POA: Diagnosis not present

## 2013-08-22 DIAGNOSIS — R9431 Abnormal electrocardiogram [ECG] [EKG]: Secondary | ICD-10-CM | POA: Insufficient documentation

## 2013-08-22 DIAGNOSIS — K219 Gastro-esophageal reflux disease without esophagitis: Secondary | ICD-10-CM | POA: Insufficient documentation

## 2013-08-22 DIAGNOSIS — I509 Heart failure, unspecified: Secondary | ICD-10-CM | POA: Insufficient documentation

## 2013-08-22 DIAGNOSIS — I428 Other cardiomyopathies: Secondary | ICD-10-CM | POA: Diagnosis not present

## 2013-08-22 HISTORY — DX: Syncope and collapse: R55

## 2013-08-22 HISTORY — DX: Obesity, unspecified: E66.9

## 2013-08-22 LAB — BASIC METABOLIC PANEL
BUN: 12 mg/dL (ref 6–23)
CO2: 25 mEq/L (ref 19–32)
Calcium: 8.4 mg/dL (ref 8.4–10.5)
Chloride: 103 mEq/L (ref 96–112)
Creatinine, Ser: 0.97 mg/dL (ref 0.50–1.35)
GFR calc Af Amer: >90 mL/min (ref >90)
GFR calc non Af Amer: 84 mL/min — ABNORMAL LOW (ref >90)
Glucose, Bld: 160 mg/dL — ABNORMAL HIGH (ref 70–99)
Potassium: 3.7 mEq/L (ref 3.7–5.3)
Sodium: 142 mEq/L (ref 137–147)

## 2013-08-22 LAB — CBC WITH DIFFERENTIAL/PLATELET
Basophils Absolute: 0.1 10*3/uL (ref 0.0–0.1)
Basophils Relative: 1 % (ref 0–1)
Eosinophils Absolute: 0.4 10*3/uL (ref 0.0–0.7)
Eosinophils Relative: 4 % (ref 0–5)
HCT: 49.8 % (ref 39.0–52.0)
Hemoglobin: 17.6 g/dL — ABNORMAL HIGH (ref 13.0–17.0)
Lymphocytes Relative: 11 % — ABNORMAL LOW (ref 12–46)
Lymphs Abs: 1.1 10*3/uL (ref 0.7–4.0)
MCH: 35.3 pg — ABNORMAL HIGH (ref 26.0–34.0)
MCHC: 35.3 g/dL (ref 30.0–36.0)
MCV: 100 fL (ref 78.0–100.0)
Monocytes Absolute: 0.4 10*3/uL (ref 0.1–1.0)
Monocytes Relative: 4 % (ref 3–12)
Neutro Abs: 8.6 10*3/uL — ABNORMAL HIGH (ref 1.7–7.7)
Neutrophils Relative %: 81 % — ABNORMAL HIGH (ref 43–77)
Platelets: 238 10*3/uL (ref 150–400)
RBC: 4.98 MIL/uL (ref 4.22–5.81)
RDW: 16.6 % — ABNORMAL HIGH (ref 11.5–15.5)
WBC: 10.6 10*3/uL — ABNORMAL HIGH (ref 4.0–10.5)

## 2013-08-22 LAB — PROTIME-INR
INR: 2.66 — ABNORMAL HIGH (ref 0.00–1.49)
Prothrombin Time: 27.4 seconds — ABNORMAL HIGH (ref 11.6–15.2)

## 2013-08-22 LAB — RAPID URINE DRUG SCREEN, HOSP PERFORMED
Amphetamines: NOT DETECTED
BARBITURATES: NOT DETECTED
Benzodiazepines: NOT DETECTED
COCAINE: NOT DETECTED
Opiates: POSITIVE — AB
TETRAHYDROCANNABINOL: NOT DETECTED

## 2013-08-22 LAB — TROPONIN I: Troponin I: <0.3 ng/mL (ref <0.30)

## 2013-08-22 LAB — PRO B NATRIURETIC PEPTIDE: Pro B Natriuretic peptide (BNP): 2511 pg/mL — ABNORMAL HIGH (ref 0–125)

## 2013-08-22 MED ORDER — HYDROXYUREA 500 MG PO CAPS
500.0000 mg | ORAL_CAPSULE | Freq: Every day | ORAL | Status: DC
  Administered 2013-08-23 – 2013-08-24 (×2): 500 mg via ORAL
  Filled 2013-08-22 (×3): qty 1

## 2013-08-22 MED ORDER — ALBUTEROL (5 MG/ML) CONTINUOUS INHALATION SOLN
15.0000 mg/h | INHALATION_SOLUTION | RESPIRATORY_TRACT | Status: DC
  Administered 2013-08-22: 15 mg/h via RESPIRATORY_TRACT
  Filled 2013-08-22: qty 20

## 2013-08-22 MED ORDER — FLUTICASONE PROPIONATE HFA 44 MCG/ACT IN AERO
2.0000 | INHALATION_SPRAY | Freq: Two times a day (BID) | RESPIRATORY_TRACT | Status: DC
  Administered 2013-08-22 – 2013-08-24 (×4): 2 via RESPIRATORY_TRACT
  Filled 2013-08-22 (×2): qty 10.6

## 2013-08-22 MED ORDER — WARFARIN SODIUM 5 MG PO TABS
5.0000 mg | ORAL_TABLET | Freq: Once | ORAL | Status: AC
  Administered 2013-08-22: 5 mg via ORAL
  Filled 2013-08-22: qty 1

## 2013-08-22 MED ORDER — ALBUTEROL SULFATE HFA 108 (90 BASE) MCG/ACT IN AERS: 2.0000 | INHALATION_SPRAY | Freq: Four times a day (QID) | RESPIRATORY_TRACT | Status: DC | PRN

## 2013-08-22 MED ORDER — SODIUM CHLORIDE 0.9 % IJ SOLN
3.0000 mL | Freq: Two times a day (BID) | INTRAMUSCULAR | Status: DC
  Administered 2013-08-22 – 2013-08-24 (×3): 3 mL via INTRAVENOUS

## 2013-08-22 MED ORDER — IOHEXOL 350 MG/ML SOLN
100.0000 mL | Freq: Once | INTRAVENOUS | Status: AC | PRN
  Administered 2013-08-22: 100 mL via INTRAVENOUS

## 2013-08-22 MED ORDER — FUROSEMIDE 20 MG PO TABS
20.0000 mg | ORAL_TABLET | Freq: Every day | ORAL | Status: DC
Start: 2013-08-23 — End: 2013-08-24
  Administered 2013-08-23 – 2013-08-24 (×2): 20 mg via ORAL
  Filled 2013-08-22 (×3): qty 1

## 2013-08-22 MED ORDER — ALBUTEROL SULFATE (2.5 MG/3ML) 0.083% IN NEBU: 2.5000 mg | INHALATION_SOLUTION | Freq: Four times a day (QID) | RESPIRATORY_TRACT | Status: DC | PRN

## 2013-08-22 MED ORDER — WARFARIN - PHARMACIST DOSING INPATIENT: Freq: Every day | Status: DC

## 2013-08-22 NOTE — Progress Notes (Signed)
ANTICOAGULATION CONSULT NOTE - Initial Consult  Pharmacy Consult for Warfarin Indication: atrial fibrillation  Allergies  Allergen Reactions  . Codeine Rash    Patient Measurements: Height: 5\' 6"  (167.6 cm) Weight: 180 lb (81.647 kg) IBW/kg (Calculated) : 63.8  Vital Signs: Temp: 99.1 F (37.3 C) (02/03 0937) Temp src: Oral (02/03 0937) BP: 144/69 mmHg (02/03 1609) Pulse Rate: 63 (02/03 1609)  Labs:  Recent Labs  08/22/13 1000  HGB 17.6*  HCT 49.8  PLT 238  LABPROT 27.4*  INR 2.66*  CREATININE 0.97  TROPONINI <0.30    Estimated Creatinine Clearance: 75.1 ml/min (by C-G formula based on Cr of 0.97).   Medical History: Past Medical History  Diagnosis Date  . Cardiomyopathy   . Substance abuse     alcohol  . Hypertension   . Atrial flutter      status post ablation on 09/11/2010  . CHF (congestive heart failure)     EF 35% with diffuse hypokinesis  . Atrial fibrillation   . Shortness of breath     "all the time lately" (12/14/2012)  . GERD (gastroesophageal reflux disease)   . Hepatitis     "think I had that once; a long time ago; from dirty needles I think" (12/14/2012)  . 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 Leukocytosis, unspecified 06/12/2013  . Myeloproliferative neoplasm 08/15/2013    Medications:  Prescriptions prior to admission  Medication Sig Dispense Refill  . albuterol (PROVENTIL HFA;VENTOLIN HFA) 108 (90 BASE) MCG/ACT inhaler Inhale 2 puffs into the lungs every 6 (six) hours as needed for wheezing.  1 Inhaler  2  . amiodarone (PACERONE) 200 MG tablet Take 1 tablet (200 mg total) by mouth daily.  30 tablet  5  . benzonatate (TESSALON) 100 MG capsule Take 1 capsule (100 mg total) by mouth 2 (two) times daily.  20 capsule  0  . Budesonide (PULMICORT FLEXHALER) 90 MCG/ACT inhaler Inhale 2 puffs into the lungs 2 (two) times daily.  1 Inhaler  3  . carvedilol (COREG) 12.5 MG tablet Take 1 tablet (12.5 mg total) by mouth 2 (two) times daily with a  meal.  180 tablet  1  . furosemide (LASIX) 40 MG tablet Take 0.5 tablets (20 mg total) by mouth daily.  30 tablet  1  . hydroxyurea (HYDREA) 500 MG capsule Take 1 capsule (500 mg total) by mouth daily. May take with food to minimize GI side effects.  30 capsule  5  . warfarin (COUMADIN) 5 MG tablet Take 2.5-5 mg by mouth daily. Take 1/2 tablet (2.5 mg) on Sunday,wednesday,friday and Saturday. Take 1 tablet (5 mg) on Monday,tuesday and thursday        Assessment: 68 yo M admitted 08/22/2013  With respiratory distress.  Pharmacy consulted to continue warfarin  PMH: HTN, Afib, CHF (EF 35%), GERD, myeloproliferative disorder  PTA warfarin:   (2.5 mg) on Sunday,wednesday,friday and Saturday. Take 1 tablet (5 mg) on Monday,tuesday and thursday    Coag: Afib INR at goal   Goal of Therapy:  INR 2-3 Monitor platelets by anticoagulation protocol: Yes   Plan:  1. Warfarin 5 mg tonight 2. Daily INR, if INR stable continue home dose.   Thank you for allowing pharmacy to be a part of this patients care team.  Rowe Robert Pharm.D., BCPS Clinical Pharmacist 08/22/2013 6:53 PM Pager: [phone] Phone: [phone]

## 2013-08-22 NOTE — ED Notes (Addendum)
Brought patient something to drink (sprite) and crackers. Nurse notified. [employer]

## 2013-08-22 NOTE — ED Provider Notes (Signed)
CSN: [id number]     Arrival date & time 08/22/13  0927 History   First MD Initiated Contact with Patient 08/22/13 [phone]     Chief Complaint  Patient presents with  . Respiratory Distress   (Consider location/radiation/quality/duration/timing/severity/associated sxs/prior Treatment) HPI  68 year old male with shortness of breath and syncope. Onset just before arrival.. Preceded by oneto two minutes of feeling short of breath while doing some light work around the house. Patient's other greatest historian. Does not really remember the events after this until awakening edema surrounding. Per EMS report, patient was found with agonal breathing. He need assisted ventilation via bag valve mask. Wheezing and started on albuterol nebulizer prior to arrival. Since that he had fairly brisk return to consciousness now currently he appears in no acute distress. He denies any pain anywhere. No incontinence. No history of seizure. Reports compliance with his medications. No fevers or chills. Was feeling fine earlier in the day prior to this event.  Past Medical History  Diagnosis Date  . Cardiomyopathy   . Substance abuse     alcohol  . Hypertension   . Atrial flutter      status post ablation on 09/11/2010  . CHF (congestive heart failure)     EF 35% with diffuse hypokinesis  . Atrial fibrillation   . Shortness of breath     "all the time lately" (12/14/2012)  . GERD (gastroesophageal reflux disease)   . Hepatitis     "think I had that once; a long time ago; from dirty needles I think" (12/14/2012)  . 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 Leukocytosis, unspecified 06/12/2013  . 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 Hemorrhoid surgery  1970's  . Cardiac electrophysiology mapping and ablation  08/2010    /notes 09/07/2010 (12/14/2012)   Family History  Problem Relation Age of Onset  . Diabetes Mother   . Hypertension Mother   . Cirrhosis Father    . Alcohol abuse Father    History  Substance Use Topics  . Smoking status: Current Every Day Smoker -- 1.00 packs/day for 40 years    Types: Cigarettes  . Smokeless tobacco: Never Used     Comment: patient smoked 1-2ppd x 50 years, now smoking approx 4-5 cigs per day x 1 year  . Alcohol Use: 0.0 oz/week     Comment: drinks a beer once in a while for the past year, but has h/o heavy alcohol use for many years before this    Review of Systems  All systems reviewed and negative, other than as noted in HPI.   Allergies  Codeine  Home Medications   Current Outpatient Rx  Name  Route  Sig  Dispense  Refill  . albuterol (PROVENTIL HFA;VENTOLIN HFA) 108 (90 BASE) MCG/ACT inhaler   Inhalation   Inhale 2 puffs into the lungs every 6 (six) hours as needed for wheezing.   1 Inhaler   2   . amiodarone (PACERONE) 200 MG tablet   Oral   Take 1 tablet (200 mg total) by mouth daily.   30 tablet   5   . benzonatate (TESSALON) 100 MG capsule   Oral   Take 1 capsule (100 mg total) by mouth 2 (two) times daily.   20 capsule   0   . Budesonide (PULMICORT FLEXHALER) 90 MCG/ACT inhaler   Inhalation   Inhale 2 puffs into the lungs 2 (two) times daily.   1 Inhaler   3   .  carvedilol (COREG) 12.5 MG tablet   Oral   Take 1 tablet (12.5 mg total) by mouth 2 (two) times daily with a meal.   180 tablet   1   . furosemide (LASIX) 40 MG tablet   Oral   Take 0.5 tablets (20 mg total) by mouth daily.   30 tablet   1   . hydroxyurea (HYDREA) 500 MG capsule   Oral   Take 1 capsule (500 mg total) by mouth daily. May take with food to minimize GI side effects.   30 capsule   5   . warfarin (COUMADIN) 5 MG tablet      Take 1/2 tablet (2.5mg ) on Sunday, Wednesday,Friday and Saturday. Take 1 tablet (5mg ) on Monday, Tuesday and Thursday.   30 tablet   2    BP 115/75  Pulse 60  Temp(Src) 99.1 F (37.3 C) (Oral)  Resp 16  SpO2 91% Physical Exam  Nursing note and vitals  reviewed. Constitutional: He is oriented to person, place, and time. He appears well-developed and well-nourished. No distress.  HENT:  Head: Normocephalic and atraumatic.  Eyes: Conjunctivae are normal. Right eye exhibits no discharge. Left eye exhibits no discharge.  Neck: Neck supple.  Cardiovascular: Normal rate, regular rhythm and normal heart sounds.  Exam reveals no gallop and no friction rub.   No murmur heard. Pulmonary/Chest: Effort normal. No respiratory distress. He has wheezes.  Abdominal: Soft. He exhibits no distension. There is no tenderness.  Musculoskeletal: He exhibits no edema and no tenderness.  Lower extremities symmetric as compared to each other. No calf tenderness. Negative Homan's. No palpable cords.   Neurological: He is alert and oriented to person, place, and time. No cranial nerve deficit. He exhibits normal muscle tone. Coordination normal.  Skin: Skin is warm and dry.  Psychiatric: He has a normal mood and affect. His behavior is normal. Thought content normal.    ED Course  Procedures (including critical care time) Labs Review Labs Reviewed  CBC WITH DIFFERENTIAL - Abnormal; Notable for the following:    WBC 10.6 (*)    Hemoglobin 17.6 (*)    MCH 35.3 (*)    RDW 16.6 (*)    Neutrophils Relative % 81 (*)    Neutro Abs 8.6 (*)    Lymphocytes Relative 11 (*)    All other components within normal limits  BASIC METABOLIC PANEL  TROPONIN I  PRO B NATRIURETIC PEPTIDE  PROTIME-INR   Imaging Review No results found.  Ct Angio Chest W/cm &/or Wo Cm  08/22/2013   CLINICAL DATA:  Difficulty breathing/respiratory distress  EXAM: CT ANGIOGRAPHY CHEST WITH CONTRAST  TECHNIQUE: Multidetector CT imaging of the chest was performed using the standard protocol during bolus administration of intravenous contrast. Multiplanar CT image reconstructions including MIPs were obtained to evaluate the vascular anatomy.  CONTRAST:  80 mL OMNIPAQUE IOHEXOL 350 MG/ML SOLN   COMPARISON:  Chest radiograph August 22, 2013  FINDINGS: There is no demonstrable pulmonary embolus. The aorta is somewhat diffusely ectatic. The ascending thoracic aorta is enlarged measuring 4.6 x 4.6 cm. There is no thoracic aortic dissection.  There is underlying centrilobular emphysema. There is lower lobe bronchiectatic change bilaterally.  There is patchy atelectatic change in both lower lobes, more on the right than on the left. There is mild atelectasis in the posterior aspects of both upper lobes as well. There is no well-defined airspace consolidation.  There is no appreciable thoracic adenopathy. There is left ventricular hypertrophy. Pericardium is  not thickened. There is a small hiatal hernia.  Visualized upper abdominal structures appear unremarkable. There are no blastic or lytic bone lesions. There is a dominant mass in the right lobe of the thyroid measuring 2.1 x 1.9 cm.  Review of the MIP images confirms the above findings.  IMPRESSION: No demonstrable pulmonary embolus.  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 Indeed, the entire aorta is mildly ectatic. No dissection.  Areas of underlying emphysematous change. Lower lobe bronchiectatic change is noted bilaterally. There is patchy atelectasis in both upper and lower lobes, most notably in the right lower lobe. No well-defined consolidation.  There is a dominant mass in the right lobe of the thyroid. Nonemergent thyroid ultrasound advised to further assess.   Electronically Signed   By: Lowella Grip M.D.   On: 08/22/2013 11:35   Dg Chest Portable 1 View  08/22/2013   CLINICAL DATA:  Respiratory distress.  EXAM: PORTABLE CHEST - 1 VIEW  COMPARISON:  DG CHEST 2 VIEW dated 05/18/2013  FINDINGS: Mediastinum and hilar structures are normal. Cardiomegaly with mild pulmonary vascular prominence and interstitial prominence. Small pleural effusions bilaterally. These findings are consistent with CHF. No focal alveolar infiltrate. No  pneumothorax. No acute osseous abnormality.  IMPRESSION: Findings consistent with CHF with mild pulmonary interstitial edema and small bilateral pleural effusions.   Electronically Signed   By: Marcello Moores  Register   On: 08/22/2013 20:17   EKG Interpretation    Date/Time:  Tuesday August 22 2013 09:41:09 EST Ventricular Rate:  57 PR Interval:  183 QRS Duration: 94 QT Interval:  458 QTC Calculation: 446 R Axis:   -11 Text Interpretation:  Sinus rhythm Left ventricular hypertrophy Non-specific ST-t changes Confirmed by Wilson Singer  MD, Yuvia Plant ([id number]) on 08/22/2013 11:05:12 AM Also confirmed by Wilson Singer  MD, Nateisha Moyd ([id number])  on 08/22/2013 11:23:57 AM            MDM   1. Syncope     68 year old male with shortness of breath and syncope. Consider primary respiratory issue with his history of underlying COPD and wheezing on exam. His presentation does not completely fit with this though. Report of agonal breathing on EMS arrival requiring assisted ventilation with bag valve mask. Patient made a very dramatic turnaround if this was indeed a primary pulmonary event. Typically bronchospasmic events resulting in symptoms to this degree due not respond so briskly to treatment. Pt was alert, completely appropriate and speaking in complete sentences by the time he arrived to the ED. He does have some wheezing, but not particularly bad and no increased WOB.   PE is a consideration, but probably not the case with INR of 2.6. PE's that cause syncope are usually quite massive and, again, I would expect continued symptoms if this was the case. Pt is pretty much symptom free at this point. Will CT to r/o though.   Concern that may be cardiogenic.Pt not greatest historian, but doesn't give clear description of preceding dizziness, lightheadedness, etc though Patient has been bradycardic down into the low 40s despite multiple nebs prior to arrival and on a continuous albuterol treatment in ED.  Mild hypotension. He is on  amiodarone as well as coreg. May potentially be cardiotoxic. EKG reassuring though . Recent ECHO this past November, but I could not locate report.   Clinically not septic. Oral trauma. No hx of seizure though and quick return to baseline more consistent with syncope, not seizure.     Virgel Manifold, MD 08/27/13 2222

## 2013-08-22 NOTE — H&P (Signed)
 Date: 08/22/2013               Patient Name:  [name] MRN: [id number]  DOB: 08/31/1945 Age / Sex: 67 y.o., male   PCP: Tracy N McLean, MD         Medical Service: Internal Medicine Teaching Service         Attending Physician: Dr. Klima    First Contact: Dr.  Pager: [phone]  Second Contact: Dr. Glenn Pager: [phone]       After Hours (After 5p/  First Contact Pager: [phone]  weekends / holidays): Second Contact Pager: [phone]   Chief Complaint: Dyspnea, syncope  History of Present Illness: [name] is a 67 y.o. male w/ PMHx of alcohol abuse, HTN, A-fib (on Coumadin), CHF, GERD, and secondary polycythemia, presents to the ED w/ complaints of recent dyspnea, followed by loss of consciousness. Patient is a relatively poor historian, but said he was in his usual state of health, doing some normal household activities earlier today, when he became SOB, which lasted for about 1-2 minutes. This was then followed by loss of consciousness, witnessed by his wife (not at bedside). He claims he bit his tongue (not obvious on exam), but denies bowel or bladder incontinence, no associated prodrome, aura, dizziness, lightheadedness, obvious palpitations, recent fever, or chills. According to EMS notes, the patient required bag-valve ventilation for "agonal " breathing in transport. On arrival to the ED, patient had complete return to baseline, w/ no EKG changes, SpO2 95-100%, and no obvious post-ictal state. 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  Patient has a cardiac history, involving atrial flutter and received ablation in 2012, something which he does not recall, now w/ paroxysmal A-fib on Amiodarone. The patient also claims had a possible "seizure" some time last year while on vacation, but does not recall the details.   Meds: Current Facility-Administered Medications  Medication Dose Route  Frequency Provider Last Rate Last Dose  . albuterol (PROVENTIL) (2.5 MG/3ML) 0.083% nebulizer solution 2.5 mg  2.5 mg Nebulization Q6H PRN Lawrence D Klima, MD      . fluticasone (FLOVENT HFA) 44 MCG/ACT inhaler 2 puff  2 puff Inhalation BID  W , MD      . [START ON 08/23/2013] furosemide (LASIX) tablet 20 mg  20 mg Oral Q breakfast  W , MD      . [START ON 08/23/2013] hydroxyurea (HYDREA) capsule 500 mg  500 mg Oral Q breakfast  W , MD      . sodium chloride 0.9 % injection 3 mL  3 mL Intravenous Q12H  W , MD      . warfarin (COUMADIN) tablet 5 mg  5 mg Oral Once Lawrence D Klima, MD      . [START ON 08/23/2013] Warfarin - Pharmacist Dosing Inpatient   Does not apply q1800 Lawrence D Klima, MD       Allergies: Allergies as of 08/22/2013 - Review Complete 08/22/2013  Allergen Reaction Noted  . Codeine Rash 09/30/2010   Past Medical History  Diagnosis Date  . Cardiomyopathy   . Substance abuse     alcohol  . Hypertension   . Atrial flutter      status post ablation on 09/11/2010  . CHF (congestive heart failure)     EF 35% with diffuse hypokinesis  . Atrial fibrillation   . Shortness of breath     "all the time   lately" (12/14/2012)  . GERD (gastroesophageal reflux disease)   . Hepatitis     "think I had that once; a long time ago; from dirty needles I think" (12/14/2012)  . 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 Leukocytosis, unspecified 06/12/2013  . 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 Hemorrhoid surgery  1970's  . Cardiac electrophysiology mapping and ablation  08/2010    /notes 09/07/2010 (12/14/2012)   Family History  Problem Relation Age of Onset  . Diabetes Mother   . Hypertension Mother   . Cirrhosis Father   . Alcohol abuse Father    History   Social History  . Marital Status: Single    Spouse Name: N/A    Number of Children: N/A  . Years of Education: N/A   Occupational  History  . Not on file.   Social History Main Topics  . Smoking status: Current Every Day Smoker -- 1.00 packs/day for 40 years    Types: Cigarettes  . Smokeless tobacco: Never Used     Comment: patient smoked 1-2ppd x 50 years, now smoking approx 4-5 cigs per day x 1 year  . Alcohol Use: 0.0 oz/week     Comment: drinks a beer once in a while for the past year, but has h/o heavy alcohol use for many years before this  . Drug Use: Yes    Special: "Crack" cocaine, Cocaine, Heroin     Comment: 03/20/2013 "been clean for awhile"; endorses using heroin "all my life" up until about a year ago  . Sexual Activity: Not Currently   Other Topics Concern  . Not on file   Social History Narrative   Moved here from [place]. Lives with common law wife and grandchildren. He is retired from "general labor."   Review of Systems: General: Denies fever, chills, diaphoresis, appetite change and fatigue.  Respiratory: Positive for SOB. Denies cough, chest tightness, and wheezing.   Cardiovascular: Denies chest pain, palpitations and leg swelling.  Gastrointestinal: Denies nausea, vomiting, abdominal pain, diarrhea, constipation, blood in stool and abdominal distention.  Genitourinary: Denies dysuria, urgency, frequency, hematuria, flank pain and difficulty urinating.  Endocrine: Denies hot or cold intolerance, sweats, polyuria, polydipsia. Musculoskeletal: Denies myalgias, back pain, joint swelling, arthralgias and gait problem.  Skin: Denies pallor, rash and wounds.  Neurological: Positive for syncope. Denies dizziness, seizures, weakness, lightheadedness, numbness and headaches.  Psychiatric/Behavioral: Denies mood changes, confusion, nervousness, sleep disturbance and agitation.  Physical Exam: Filed Vitals:   08/22/13 1500 08/22/13 1515 08/22/13 1530 08/22/13 1609  BP: 133/77 97/45 125/66 144/69  Pulse: 69 66 66 63  Temp:      TempSrc:      Resp: 19 19 16 20  Height:    5' 6" (1.676 m)    Weight:    180 lb (81.647 kg)  SpO2: 94% 94% 96% 98%   General: Vital signs reviewed.  Patient is a well-developed and well-nourished, in no acute distress and cooperative with exam. Alert and oriented x3.  Head: Normocephalic and atraumatic. Eyes: PERRL, EOMI, conjunctivae normal, No scleral icterus.  Neck: Supple, trachea midline, normal ROM, No JVD, masses, thyromegaly, or carotid bruit present.  Cardiovascular: RRR, S1 normal, S2 normal, no murmurs, gallops, or rubs. Pulmonary/Chest: Normal respiratory effort, CTAB, no wheezes, rales, or rhonchi. Abdominal: Soft, non-tender, non-distended, bowel sounds are normal, no masses, organomegaly, or guarding present.  Musculoskeletal: No joint deformities, erythema, or stiffness, ROM full and no nontender. Extremities: No swelling or edema,    pulses symmetric and intact bilaterally. No cyanosis or clubbing. Neurological: A&O x3, Strength is normal and symmetric bilaterally, cranial nerve II-XII are grossly intact, no focal motor deficit, sensory intact to light touch bilaterally.  Skin: Warm, dry and intact. No rashes or erythema. Psychiatric: Normal mood and affect. speech and behavior is normal. Cognition and memory are normal.   Lab results: Basic Metabolic Panel:  Recent Labs  08/22/13 1000  NA 142  K 3.7  CL 103  CO2 25  GLUCOSE 160*  BUN 12  CREATININE 0.97  CALCIUM 8.4   Liver Function Tests: No results found for this basename: AST, ALT, ALKPHOS, BILITOT, PROT, ALBUMIN,  in the last 72 hours No results found for this basename: LIPASE, AMYLASE,  in the last 72 hours No results found for this basename: AMMONIA,  in the last 72 hours CBC:  Recent Labs  08/22/13 1000  WBC 10.6*  NEUTROABS 8.6*  HGB 17.6*  HCT 49.8  MCV 100.0  PLT 238   Cardiac Enzymes:  Recent Labs  08/22/13 1000  TROPONINI <0.30   BNP:  Recent Labs  08/22/13 1000  PROBNP 2511.0*   D-Dimer: No results found for this basename: DDIMER,  in  the last 72 hours CBG: No results found for this basename: GLUCAP,  in the last 72 hours Hemoglobin A1C: No results found for this basename: HGBA1C,  in the last 72 hours Fasting Lipid Panel: No results found for this basename: CHOL, HDL, LDLCALC, TRIG, CHOLHDL, LDLDIRECT,  in the last 72 hours Thyroid Function Tests: No results found for this basename: TSH, T4TOTAL, FREET4, T3FREE, THYROIDAB,  in the last 72 hours Anemia Panel: No results found for this basename: VITAMINB12, FOLATE, FERRITIN, TIBC, IRON, RETICCTPCT,  in the last 72 hours Coagulation:  Recent Labs  08/22/13 1000  LABPROT 27.4*  INR 2.66*   Urine Drug Screen: Drugs of Abuse     Component Value Date/Time   LABOPIA POSITIVE* 08/22/2013 1744   COCAINSCRNUR NONE DETECTED 08/22/2013 1744   LABBENZ NONE DETECTED 08/22/2013 1744   AMPHETMU NONE DETECTED 08/22/2013 1744   THCU NONE DETECTED 08/22/2013 1744   LABBARB NONE DETECTED 08/22/2013 1744    Alcohol Level: No results found for this basename: ETH,  in the last 72 hours Urinalysis: No results found for this basename: COLORURINE, APPERANCEUR, LABSPEC, PHURINE, GLUCOSEU, HGBUR, BILIRUBINUR, KETONESUR, PROTEINUR, UROBILINOGEN, NITRITE, LEUKOCYTESUR,  in the last 72 hours  Imaging results:  Ct Angio Chest W/cm &/or Wo Cm  08/22/2013   CLINICAL DATA:  Difficulty breathing/respiratory distress  EXAM: CT ANGIOGRAPHY CHEST WITH CONTRAST  TECHNIQUE: Multidetector CT imaging of the chest was performed using the standard protocol during bolus administration of intravenous contrast. Multiplanar CT image reconstructions including MIPs were obtained to evaluate the vascular anatomy.  CONTRAST:  80 mL OMNIPAQUE IOHEXOL 350 MG/ML SOLN  COMPARISON:  Chest radiograph August 22, 2013  FINDINGS: There is no demonstrable pulmonary embolus. The aorta is somewhat diffusely ectatic. The ascending thoracic aorta is enlarged measuring 4.6 x 4.6 cm. There is no thoracic aortic dissection.  There is  underlying centrilobular emphysema. There is lower lobe bronchiectatic change bilaterally.  There is patchy atelectatic change in both lower lobes, more on the right than on the left. There is mild atelectasis in the posterior aspects of both upper lobes as well. There is no well-defined airspace consolidation.  There is no appreciable thoracic adenopathy. There is left ventricular hypertrophy. Pericardium is not thickened. There is a small hiatal hernia.    Visualized upper abdominal structures appear unremarkable. There are no blastic or lytic bone lesions. There is a dominant mass in the right lobe of the thyroid measuring 2.1 x 1.9 cm.  Review of the MIP images confirms the above findings.  IMPRESSION: No demonstrable pulmonary embolus.  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 Indeed, the entire aorta is mildly ectatic. No dissection.  Areas of underlying emphysematous change. Lower lobe bronchiectatic change is noted bilaterally. There is patchy atelectasis in both upper and lower lobes, most notably in the right lower lobe. No well-defined consolidation.  There is a dominant mass in the right lobe of the thyroid. Nonemergent thyroid ultrasound advised to further assess.   Electronically Signed   By: William  Woodruff M.D.   On: 08/22/2013 11:35   Other results: EKG: NSR @ 65 bpm. Borderline GT prolongation. J-point elevation, anterior leads. No change from previous EKG.   Assessment & Plan by Problem: [name] is a 67 y.o. male w/ PMHx of alcohol abuse/heroine abuse, HTN, h/o A-fib (on Coumadin), dCHF, GERD, presents to the ED w/ complaints of recent dyspnea, followed by loss of consciousness, admitted for suspected arrhythmia vs seizure.   Loss of consciousness- Patient w/ preceding SOB, but no aura, prodrome, or bowel or bladder incontinence. History unclear, but seizure unlikely at this time. Given previous history of A-flutter s/p ablation w/ PAF on Amiodarone +Coreg, very  likely 2/2 arrhythmia vs medications at this time. Patient also w/ previously documented history of bradycardia as well. 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 TIA possible, however, unlikely given current Coumadin use (therapeutic at this time). Electrolyte abnormalities and metabolic causes unlikely at this time given normal admission labs. Some question of PE in ED, CTA performed, negative for pulmonary embolism. CXR shows NACPD. -Admit to telemetry -Cardiac monitoring -EP cardiology to see in AM -Trend troponins; x1 -ve -PT/INR in AM -Albuterol nebs q6h prn -Flovent bid -Seizure precautions -O2 via [place] prn  Paroxysmal A-fib- Patient in NSR on admission, no EKG changes from previous. Currently on Amiodarone + Coreg and Coumadin for anti-coagulation. No issues, however, may be associated w/ episode of SOB/LOC. Episodic A-fib w/ RVR cannot be ruled out. Patient may also have symptomatic bradycardia 2/2 Amiodarone. -Continuous cardiac monitoring -EP to see in AM -Hold Coreg, Amiodarone -Continue Coumadin  dCHF (EF 50-55%)- Patient euvolemic on exam. Most recent ECHO w/ EF of 50-55%, no wall motion abnormalities. Improved from 40-45% w/ diffuse hypokinesis. No issues at this time.  -Continue Lasix 20 mg qd  Substance abuse- Patient denies recent alcohol abuse, but admits to heroine use, which he claims he snorted as recently as yesterday. UDS +ve for opiates. May have contributed to respiratory depression and LOC if used today. -Social work consult  GERD- No issues  Polycythemia- Patient found to have polycythemia 2/2 myeloproliferative disorder w/ JAK7 mutation, however bone marrow biopsy performed was normal. No issues at this time.  -Continue Coumadin -Continue Hydorxyurea  DVT Px- SCD's  Dispo: Disposition is deferred at this time, awaiting improvement of current medical problems. Anticipated discharge in approximately  1-2 day(s).   The patient does have a current PCP (Tracy N McLean, MD) and does need an [facility] hospital follow-up appointment after discharge.  The patient does not have transportation limitations that hinder transportation to clinic appointments.  Signed:  W , MD 08/22/2013, 8:12 PM   

## 2013-08-22 NOTE — ED Notes (Addendum)
Pt arrives via EMs from home with sudden onset of SOB this AM. Pt with agonal respirations for [employer]. BVM used. Bite noted to tongue, no hx of sz.  EMs gave 5mg  albuterol/0.5mg  atroven, 125mg  solumedrol  with improvement/ 20g l wrist.

## 2013-08-23 ENCOUNTER — Other Ambulatory Visit (HOSPITAL_COMMUNITY): Payer: Self-pay | Admitting: Internal Medicine

## 2013-08-23 ENCOUNTER — Encounter (HOSPITAL_COMMUNITY)
Admission: EM | Disposition: A | Discharge status: Home / Self Care | Payer: Self-pay | Source: Home / Self Care | Attending: Internal Medicine

## 2013-08-23 ENCOUNTER — Encounter (HOSPITAL_COMMUNITY): Payer: Self-pay | Admitting: General Practice

## 2013-08-23 DIAGNOSIS — I5030 Unspecified diastolic (congestive) heart failure: Secondary | ICD-10-CM

## 2013-08-23 DIAGNOSIS — R404 Transient alteration of awareness: Secondary | ICD-10-CM

## 2013-08-23 DIAGNOSIS — D45 Polycythemia vera: Secondary | ICD-10-CM

## 2013-08-23 DIAGNOSIS — K219 Gastro-esophageal reflux disease without esophagitis: Secondary | ICD-10-CM

## 2013-08-23 DIAGNOSIS — F1910 Other psychoactive substance abuse, uncomplicated: Secondary | ICD-10-CM

## 2013-08-23 DIAGNOSIS — I509 Heart failure, unspecified: Secondary | ICD-10-CM

## 2013-08-23 DIAGNOSIS — R55 Syncope and collapse: Secondary | ICD-10-CM | POA: Diagnosis not present

## 2013-08-23 DIAGNOSIS — I4891 Unspecified atrial fibrillation: Secondary | ICD-10-CM

## 2013-08-23 HISTORY — PX: LOOP RECORDER IMPLANT: SHX5477

## 2013-08-23 LAB — CBC
HCT: 49.6 % (ref 39.0–52.0)
Hemoglobin: 17.2 g/dL — ABNORMAL HIGH (ref 13.0–17.0)
MCH: 34.7 pg — ABNORMAL HIGH (ref 26.0–34.0)
MCHC: 34.7 g/dL (ref 30.0–36.0)
MCV: 100 fL (ref 78.0–100.0)
PLATELETS: 234 10*3/uL (ref 150–400)
RBC: 4.96 MIL/uL (ref 4.22–5.81)
RDW: 17 % — AB (ref 11.5–15.5)
WBC: 16.2 10*3/uL — ABNORMAL HIGH (ref 4.0–10.5)

## 2013-08-23 LAB — BASIC METABOLIC PANEL
BUN: 17 mg/dL (ref 6–23)
CALCIUM: 8.7 mg/dL (ref 8.4–10.5)
CO2: 26 mEq/L (ref 19–32)
CREATININE: 0.85 mg/dL (ref 0.50–1.35)
Chloride: 99 mEq/L (ref 96–112)
GFR calc Af Amer: >90 mL/min (ref >90)
GFR calc non Af Amer: 89 mL/min — ABNORMAL LOW (ref >90)
Glucose, Bld: 145 mg/dL — ABNORMAL HIGH (ref 70–99)
Potassium: 4.1 mEq/L (ref 3.7–5.3)
SODIUM: 139 meq/L (ref 137–147)

## 2013-08-23 LAB — MAGNESIUM: Magnesium: 1.6 mg/dL (ref 1.5–2.5)

## 2013-08-23 LAB — TROPONIN I

## 2013-08-23 LAB — PROTIME-INR
INR: 3.39 — AB (ref 0.00–1.49)
Prothrombin Time: 33 seconds — ABNORMAL HIGH (ref 11.6–15.2)

## 2013-08-23 LAB — TSH: TSH: 0.166 u[IU]/mL — ABNORMAL LOW (ref 0.350–4.500)

## 2013-08-23 SURGERY — LOOP RECORDER IMPLANT
Anesthesia: LOCAL

## 2013-08-23 MED ORDER — LIDOCAINE-EPINEPHRINE 1 %-1:100000 IJ SOLN
INTRAMUSCULAR | Status: AC
  Filled 2013-08-23: qty 1

## 2013-08-23 NOTE — Consult Note (Addendum)
ELECTROPHYSIOLOGY CONSULT NOTE    Patient ID: [name] MRN: [id number], DOB/AGE: 67-May-1947 67 y.o.  Admit date: 08/22/2013 Date of Consult: 08-23-13  Primary Physician: Cresenciano Genre, MD Primary Cardiologist: Cristopher Peru, MD  Reason for Consultation: syncope  HPI: [name] is a 68 y.o. male with a past medical history of atrial flutter (s/p ablation), atrial fibrillation (on Amiodarone), hypertension, polycythemia (followed by heme-onc), and prior cardiomyopathy.  He was last seen by cardiology in July of 2014 at which time he was maintaining SR on Amiodarone and Coreg.  He is anticoagulated with Warfarin.  He has a history of intermittent syncope that occur about twice a year.  These spells are characterized by a sudden inability to catch his breath followed by a loss of consciousness.  Other than SOB, there is no prodrome.  Upon awakening, he has no residual symptoms.  He denies chest pain, palpitations, lower extremity edema, or dizziness.   He notes that he is unconscious his last tetanus of minutes; on his most recent episode he awakened in the hospital following arrival and treatment of EMS the details of which are not available. The previous episode he thinks he awakened en route with EMS.  He's also had a number of episodes where he feels the same shortness of breath but it has been unassociated with syncope.  I should note that when he presented last year with atrial fibrillation he was short of breath without tachypalpitations.  Last echo 05/2013 demonstrated an EF of 50-55%, no RWMA, mild AR, LA 43.   PFT's were ordered in June but never done.   Lab work this admission is notable for UDS + for opiates, negative cardiac enzymes, INR 3.39, Mg 1.6, WBC 16.2, Hgb 17.2.  EP has been asked to evaluate for treatment options.   Past Medical History  Diagnosis Date  . Cardiomyopathy   . Substance abuse     alcohol  . Hypertension   . Atrial flutter    status post ablation on 09/11/2010  . CHF (congestive heart failure)     EF 35% with diffuse hypokinesis  . Atrial fibrillation   . Shortness of breath     "all the time lately" (12/14/2012)  . GERD (gastroesophageal reflux disease)   . Hepatitis     "think I had that once; a long time ago; from dirty needles I think" (12/14/2012)  . 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 Leukocytosis, unspecified 06/12/2013  . 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 Dysrhythmia     hx of atrial fib     Surgical History:  Past Surgical History  Procedure Laterality Date  . Hemorrhoid surgery  1970's  . Cardiac electrophysiology mapping and ablation  08/2010    /notes 09/07/2010 (12/14/2012)     Prescriptions prior to admission  Medication Sig Dispense Refill  . albuterol (PROVENTIL HFA;VENTOLIN HFA) 108 (90 BASE) MCG/ACT inhaler Inhale 2 puffs into the lungs every 6 (six) hours as needed for wheezing.  1 Inhaler  2  . amiodarone (PACERONE) 200 MG tablet Take 1 tablet (200 mg total) by mouth daily.  30 tablet  5  . benzonatate (TESSALON) 100 MG capsule Take 1 capsule (100 mg total) by mouth 2 (two) times daily.  20 capsule  0  . Budesonide (PULMICORT FLEXHALER) 90 MCG/ACT inhaler Inhale 2 puffs into the lungs 2 (two) times daily.  1 Inhaler  3  . carvedilol (COREG) 12.5 MG tablet Take 1 tablet (12.5 mg total) by mouth 2 (two) times  daily with a meal.  180 tablet  1  . furosemide (LASIX) 40 MG tablet Take 0.5 tablets (20 mg total) by mouth daily.  30 tablet  1  . hydroxyurea (HYDREA) 500 MG capsule Take 1 capsule (500 mg total) by mouth daily. May take with food to minimize GI side effects.  30 capsule  5  . warfarin (COUMADIN) 5 MG tablet Take 2.5-5 mg by mouth daily. Take 1/2 tablet (2.5 mg) on Sunday,wednesday,friday and Saturday. Take 1 tablet (5 mg) on Monday,tuesday and thursday        Inpatient Medications:  . fluticasone  2 puff Inhalation BID  . furosemide  20 mg Oral Q breakfast  .  hydroxyurea  500 mg Oral Q breakfast  . sodium chloride  3 mL Intravenous Q12H  . Warfarin - Pharmacist Dosing Inpatient   Does not apply q1800    Allergies:  Allergies  Allergen Reactions  . Codeine Rash    History   Social History  . Marital Status: Single    Spouse Name: N/A    Number of Children: N/A  . Years of Education: N/A   Occupational History  . Not on file.   Social History Main Topics  . Smoking status: Current Every Day Smoker -- 1.00 packs/day for 40 years    Types: Cigarettes  . Smokeless tobacco: Never Used     Comment: patient smoked 1-2ppd x 50 years, now smoking approx 4-5 cigs per day x 1 year  . Alcohol Use: 0.0 oz/week     Comment: drinks a beer once in a while for the past year, but has h/o heavy alcohol use for many years before this  . Drug Use: Yes    Special: "Crack" cocaine, Cocaine, Heroin     Comment: 03/20/2013 "been clean for awhile"; endorses using heroin "all my life" up until about a year ago  . Sexual Activity: Not Currently   Other Topics Concern  . Not on file   Social History Narrative   Moved here from [place]. Lives with common law wife and grandchildren. He is retired from "general labor."    BP 136/74  Pulse 65  Temp(Src) 97.8 F (36.6 C) (Oral)  Resp 20  Ht 5\' 6"  (1.676 m)  Wt 180 lb (81.647 kg)  BMI 29.07 kg/m2  SpO2 97%  Alert and oriented in no acute distress; moderately obese HENT- normal Eyes- EOMI, without scleral icterus Skin- warm and dry; without rashes LN-neg Neck- supple without thyromegaly, JVP-flat, carotids brisk and full without bruits Back-without CVAT or kyphosis Lungs-clear to auscultation CV-Regular rate and rhythm, nl S1 and S2, 2/6 systolic murmur gallops or rubs, S4-absent Abd-soft with active bowel sounds; no midline pulsation or hepatomegaly Pulses-intact femoral and distal MKS-without gross deformity Neuro- Ax O, CN3-12 intact, grossly normal motor and sensory function Affect  engaging    Labs:   Lab Results  Component Value Date   WBC 16.2* 08/23/2013   HGB 17.2* 08/23/2013   HCT 49.6 08/23/2013   MCV 100.0 08/23/2013   PLT 234 08/23/2013    Recent Labs Lab 08/23/13 0310  NA 139  K 4.1  CL 99  CO2 26  BUN 17  CREATININE 0.85  CALCIUM 8.7  GLUCOSE 145*   Lab Results  Component Value Date   CKTOTAL 40 09/08/2010   CKMB 1.5 09/08/2010   TROPONINI <0.30 08/23/2013   Lab Results  Component Value Date   CHOL 176 12/14/2012   CHOL  Value: 143  ATP III CLASSIFICATION:  <200     mg/dL   Desirable  200-239  mg/dL   Borderline High  >=240    mg/dL   High        09/08/2010   Lab Results  Component Value Date   HDL 53 12/14/2012   HDL 34* 09/08/2010   Lab Results  Component Value Date   LDLCALC 103* 12/14/2012   LDLCALC  Value: 98        Total Cholesterol/HDL:CHD Risk Coronary Heart Disease Risk Table                     Men   Women  1/2 Average Risk   3.4   3.3  Average Risk       5.0   4.4  2 X Average Risk   9.6   7.1  3 X Average Risk  23.4   11.0        Use the calculated Patient Ratio above and the CHD Risk Table to determine the patient's CHD Risk.        ATP III CLASSIFICATION (LDL):  <100     mg/dL   Optimal  100-129  mg/dL   Near or Above                    Optimal  130-159  mg/dL   Borderline  160-189  mg/dL   High  >190     mg/dL   Very High 09/08/2010   Lab Results  Component Value Date   TRIG 99 12/14/2012   TRIG 57 09/08/2010   Lab Results  Component Value Date   CHOLHDL 3.3 12/14/2012   CHOLHDL 4.2 09/08/2010     Radiology/Studies: Ct Angio Chest W/cm &/or Wo Cm 08/22/2013   CLINICAL DATA:  Difficulty breathing/respiratory distress  EXAM: CT ANGIOGRAPHY CHEST WITH CONTRAST  TECHNIQUE: Multidetector CT imaging of the chest was performed using the standard protocol during bolus administration of intravenous contrast. Multiplanar CT image reconstructions including MIPs were obtained to evaluate the vascular anatomy.  CONTRAST:  80 mL OMNIPAQUE  IOHEXOL 350 MG/ML SOLN  COMPARISON:  Chest radiograph August 22, 2013  FINDINGS: There is no demonstrable pulmonary embolus. The aorta is somewhat diffusely ectatic. The ascending thoracic aorta is enlarged measuring 4.6 x 4.6 cm. There is no thoracic aortic dissection.  There is underlying centrilobular emphysema. There is lower lobe bronchiectatic change bilaterally.  There is patchy atelectatic change in both lower lobes, more on the right than on the left. There is mild atelectasis in the posterior aspects of both upper lobes as well. There is no well-defined airspace consolidation.  There is no appreciable thoracic adenopathy. There is left ventricular hypertrophy. Pericardium is not thickened. There is a small hiatal hernia.  Visualized upper abdominal structures appear unremarkable. There are no blastic or lytic bone lesions. There is a dominant mass in the right lobe of the thyroid measuring 2.1 x 1.9 cm.  Review of the MIP images confirms the above findings.  IMPRESSION: No demonstrable pulmonary embolus.  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 Indeed, the entire aorta is mildly ectatic. No dissection.  Areas of underlying emphysematous change. Lower lobe bronchiectatic change is noted bilaterally. There is patchy atelectasis in both upper and lower lobes, most notably in the right lower lobe. No well-defined consolidation.  There is a dominant mass in the right lobe of the thyroid. Nonemergent thyroid ultrasound advised to further assess.   Electronically  Signed   By: Lowella Grip M.D.   On: 08/22/2013 11:35   Dg Chest Portable 1 View 08/22/2013   CLINICAL DATA:  Respiratory distress.  EXAM: PORTABLE CHEST - 1 VIEW  COMPARISON:  DG CHEST 2 VIEW dated 05/18/2013  FINDINGS: Mediastinum and hilar structures are normal. Cardiomegaly with mild pulmonary vascular prominence and interstitial prominence. Small pleural effusions bilaterally. These findings are consistent with CHF. No focal  alveolar infiltrate. No pneumothorax. No acute osseous abnormality.  IMPRESSION: Findings consistent with CHF with mild pulmonary interstitial edema and small bilateral pleural effusions.   Electronically Signed   By: Marcello Moores  Register   On: 08/22/2013 20:17   EKG:  NSR 95  [birth year]  TELEMETRY: sinus rhythm, no tachy arrhythmias, no afib, intermittent prolongation of P-P and R-R intervals while sleeping  Active Problems:   Syncope atrial fibrillation QT prolongation  I suspect his syncopal episode his vasomotor. This is suggested by the prolonged prodrome i.e. enough time to go from the commode to the bedroom as well as the prolonged nature of the episode itself i.e. awakening in the ambulance on the medication and we can in the hospital on the other.  Furthermore left ventricular function was normal by echo just a few months ago; ECG is unremarkable today except for QT prolongation. The QT interval was mildly abnormal in February 2014 a couple of weeks after the initiation of amiodarone; we have no ECGs in sinus rhythm prior to that.  The nature of his syncope however does not speak to arrhythmic syncope given the length of prodrome and the prolonged duration. Typical arrhythmic syncope is abrupt in onset and offset, especially that associated with polymorphic ventricular tachycardia in the setting of QT prolongation. I suspect that the QT prolongation is related to amiodarone and is not related to his syncope.  The fact that he presented last year with atrial fibrillation associated with a rapid rate but no palpitations suggests that it might possibly be a trigger for his syncope. In order to identify back, no recommend insertion of a loop recorder.  I've also reviewed with him the importance of becoming supine if he recognizes the prodrome of tachypnea and dyspnea.  He agrees and is willing to proceed  He also needs PFTs with DLCO for amiodarone surveillance as well as TSH ; LFTs normal in  11/14

## 2013-08-23 NOTE — Progress Notes (Signed)
Clinical Social Work Department BRIEF PSYCHOSOCIAL ASSESSMENT 08/23/2013  Patient:  [name], [name]     Account Number:  [id number]     Admit date:  08/22/2013  Clinical Social Worker:  Megan Salon  Date/Time:  08/23/2013 10:57 AM  Referred by:  Physician  Date Referred:  08/23/2013 Referred for  Substance Abuse   Other Referral:   Interview type:  Patient Other interview type:    PSYCHOSOCIAL DATA Living Status:  ALONE Admitted from facility:   Level of care:   Primary support name:  [name] Primary support relationship to patient:  FRIEND Degree of support available:   Not assessed    CURRENT CONCERNS Current Concerns  Substance Abuse   Other Concerns:    SOCIAL WORK ASSESSMENT / PLAN Met with patient to discuss substance use- pt reports that he has quit drugs and stopped drinking alcohol . Patient states they do not do drugs, often, "but once in a while, but quit recently." Patient does not feel they have a problem with substance use and stated that he does not want any help or resources at this time.   Assessment/plan status:  No Further Intervention Required Other assessment/ plan:   Information/referral to community resources:   Patient refused resources or information    PATIENT'S/FAMILY'S RESPONSE TO PLAN OF CARE: Patient was open to speaking with social worker, but states he does not need any help "because he quit." Patient thanked Education officer, museum for the visit.        Jeanette Caprice, MSW, [place]

## 2013-08-23 NOTE — Progress Notes (Signed)
Subjective: Patient seen at bedside this AM. No complaints today. Denies any dizziness, lightheadedness, SOB, chest pain, or loss of consciousness.  Objective: Vital signs in last 24 hours: Filed Vitals:   08/22/13 2202 08/23/13 0505 08/23/13 0809 08/23/13 1350  BP: 105/74 122/70 130/70 136/74  Pulse: 63 64 58 65  Temp: 98.2 F (36.8 C) 98.8 F (37.1 C) 98.5 F (36.9 C) 97.8 F (36.6 C)  TempSrc: Oral Oral Oral Oral  Resp: $Remo'18 20 20 20  'byDgT$ Height:      Weight:      SpO2: 95% 93% 96% 97%   Weight change:   Intake/Output Summary (Last 24 hours) at 08/23/13 1503 Last data filed at 08/23/13 1300  Gross per 24 hour  Intake   1080 ml  Output      0 ml  Net   1080 ml   Physical Exam: General: Alert, cooperative, and in no apparent distress. Cooperative w/ exam. HEENT: Vision grossly intact, oropharynx clear and non-erythematous  Neck: Full range of motion without pain, supple, no lymphadenopathy or carotid bruits Lungs: Clear to ascultation bilaterally, normal work of respiration, no wheezes, rales, ronchi Heart: Regular rate and rhythm, no murmurs, gallops, or rubs Abdomen: Soft, non-tender, non-distended, normal bowel sounds Extremities: No cyanosis, clubbing, or edema Neurologic: Alert & oriented X3, cranial nerves II-XII intact, strength grossly intact, sensation intact to light touch  Lab Results: Basic Metabolic Panel:  Recent Labs Lab 08/22/13 1000 08/23/13 0310  NA 142 139  K 3.7 4.1  CL 103 99  CO2 25 26  GLUCOSE 160* 145*  BUN 12 17  CREATININE 0.97 0.85  CALCIUM 8.4 8.7  MG  --  1.6   CBC:  Recent Labs Lab 08/22/13 1000 08/23/13 0310  WBC 10.6* 16.2*  NEUTROABS 8.6*  --   HGB 17.6* 17.2*  HCT 49.8 49.6  MCV 100.0 100.0  PLT 238 234   Cardiac Enzymes:  Recent Labs Lab 08/22/13 1000 08/22/13 2315 08/23/13 0310  TROPONINI <0.30 <0.30 <0.30   BNP:  Recent Labs Lab 08/22/13 1000  PROBNP 2511.0*   Coagulation:  Recent Labs Lab  08/22/13 1000 08/23/13 0310  LABPROT 27.4* 33.0*  INR 2.66* 3.39*   Urine Drug Screen: Drugs of Abuse     Component Value Date/Time   LABOPIA POSITIVE* 08/22/2013 1744   COCAINSCRNUR NONE DETECTED 08/22/2013 1744   LABBENZ NONE DETECTED 08/22/2013 1744   AMPHETMU NONE DETECTED 08/22/2013 1744   THCU NONE DETECTED 08/22/2013 1744   LABBARB NONE DETECTED 08/22/2013 1744    Studies/Results: Ct Angio Chest W/cm &/or Wo Cm  08/22/2013   CLINICAL DATA:  Difficulty breathing/respiratory distress  EXAM: CT ANGIOGRAPHY CHEST WITH CONTRAST  TECHNIQUE: Multidetector CT imaging of the chest was performed using the standard protocol during bolus administration of intravenous contrast. Multiplanar CT image reconstructions including MIPs were obtained to evaluate the vascular anatomy.  CONTRAST:  80 mL OMNIPAQUE IOHEXOL 350 MG/ML SOLN  COMPARISON:  Chest radiograph August 22, 2013  FINDINGS: There is no demonstrable pulmonary embolus. The aorta is somewhat diffusely ectatic. The ascending thoracic aorta is enlarged measuring 4.6 x 4.6 cm. There is no thoracic aortic dissection.  There is underlying centrilobular emphysema. There is lower lobe bronchiectatic change bilaterally.  There is patchy atelectatic change in both lower lobes, more on the right than on the left. There is mild atelectasis in the posterior aspects of both upper lobes as well. There is no well-defined airspace consolidation.  There is no appreciable  thoracic adenopathy. There is left ventricular hypertrophy. Pericardium is not thickened. There is a small hiatal hernia.  Visualized upper abdominal structures appear unremarkable. There are no blastic or lytic bone lesions. There is a dominant mass in the right lobe of the thyroid measuring 2.1 x 1.9 cm.  Review of the MIP images confirms the above findings.  IMPRESSION: No demonstrable pulmonary embolus.  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 Indeed, the entire aorta is mildly ectatic. No  dissection.  Areas of underlying emphysematous change. Lower lobe bronchiectatic change is noted bilaterally. There is patchy atelectasis in both upper and lower lobes, most notably in the right lower lobe. No well-defined consolidation.  There is a dominant mass in the right lobe of the thyroid. Nonemergent thyroid ultrasound advised to further assess.   Electronically Signed   By: Lowella Grip M.D.   On: 08/22/2013 11:35   Dg Chest Portable 1 View  08/22/2013   CLINICAL DATA:  Respiratory distress.  EXAM: PORTABLE CHEST - 1 VIEW  COMPARISON:  DG CHEST 2 VIEW dated 05/18/2013  FINDINGS: Mediastinum and hilar structures are normal. Cardiomegaly with mild pulmonary vascular prominence and interstitial prominence. Small pleural effusions bilaterally. These findings are consistent with CHF. No focal alveolar infiltrate. No pneumothorax. No acute osseous abnormality.  IMPRESSION: Findings consistent with CHF with mild pulmonary interstitial edema and small bilateral pleural effusions.   Electronically Signed   By: Marcello Moores  Register   On: 08/22/2013 20:17   Medications: I have reviewed the patient's current medications. Scheduled Meds: . fluticasone  2 puff Inhalation BID  . furosemide  20 mg Oral Q breakfast  . hydroxyurea  500 mg Oral Q breakfast  . sodium chloride  3 mL Intravenous Q12H  . Warfarin - Pharmacist Dosing Inpatient   Does not apply q1800   Continuous Infusions:  PRN Meds:.albuterol  Assessment/Plan: [name] is a 68 y.o. male w/ PMHx of alcohol abuse/heroine abuse, HTN, h/o A-fib (on Coumadin), dCHF, GERD, presents to the ED w/ complaints of recent dyspnea, followed by loss of consciousness, admitted for suspected arrhythmia vs seizure.   Loss of consciousness- Patient w/ preceding SOB, but no aura, prodrome, or bowel or bladder incontinence. History unclear, but seizure unlikely at this time. Given previous history of A-flutter s/p ablation w/ PAF on Amiodarone +Coreg,  very likely 2/2 arrhythmia. Patient also w/ previously documented history of bradycardia as well. Troponins -ve x3. Discussed w/ Dr. Caryl Comes today; likely paroxysmal arrhythmia, resulting in SOB, may have triggered a vasovagal event, resulting in loss of consciousness. -For loop recorder placement this PM -Albuterol nebs q6h prn  -Flovent bid  -O2 via [place] prn  -D/c seizure precautions  Paroxysmal A-fib- Patient in NSR on admission, no EKG changes from previous. Currently on Amiodarone + Coreg and Coumadin for anti-coagulation. Likely a trigger for episode of syncope, as described above.  -Loop recorder placement as above -Continuous cardiac monitoring  -Continue to hold Coreg, Amiodarone  -PFT's for surveillance while on Amiodarone -Continue Coumadin   dCHF (EF 50-55%)- Patient euvolemic on exam. Most recent ECHO w/ EF of 50-55%, no wall motion abnormalities. Improved from 40-45% w/ diffuse hypokinesis. No issues at this time.  -Continue Lasix 20 mg qd   Substance abuse- Patient denies recent alcohol abuse, but admits to heroine use, which he claims he snorted as recently as yesterday. UDS +ve for opiates. May have contributed to respiratory depression and LOC if used today.  -Social work consult today. Patient claims  he has recently quit, no further intervention at this time.  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 No issues at this time.  -Continue Coumadin  -Continue Hydorxyurea   DVT Px- SCD's  Dispo: Disposition is deferred at this time, awaiting improvement of current medical problems.  Anticipated discharge tomorrow.   The patient does have a current PCP Cresenciano Genre, MD) and does need an [facility] hospital follow-up appointment after discharge.  The patient does not have transportation limitations that hinder transportation to clinic  appointments.  .Services Needed at time of discharge: Y = Yes, Blank = No PT:   OT:   RN:   Equipment:   Other:     LOS: 1 day   Corky Sox, MD 08/23/2013, 3:03 PM

## 2013-08-23 NOTE — Discharge Instructions (Addendum)
1. You have follow up appointments as scheduled:  [facility]  On 08/31/2013 At 4:00 PM for wound check  [address], [address] [place] [address] [phone]   Tracy N McLean, MD  On 09/07/2013 1:45 PM  [place] [address] [phone]  Evans Lance  On 10/11/2013 At 11:30 AM  [address], [place] [address] [phone]  2. Please take all medications as prescribed.   3. If you have worsening of your symptoms or new symptoms arise, please call the clinic [phone] or go to the ER immediately if symptoms are severe.     Supplemental Discharge Instructions for  Loop Recorder Insertion  NO DRIVING for 6 months until given clearance by Dr. Lovena Le. WOUND CARE   Keep the wound area clean and dry.  You may shower but no tub bath, swimming pool or hot tub for 7-10 days until completely healed.   The Dermabond (glue) on your wound will fall off on its own; do not pull it off.  No bandage is needed on the site.  DO  NOT apply any creams, oils, or ointments to the wound area.   If you notice any drainage or discharge from the wound, any swelling or bruising at the site, or you develop a fever > 101? F after you are discharged home, call the office at once.  Special Instructions   You are still able to use cellular telephones; use the ear opposite the side where you have your pacemaker/defibrillator.  Avoid carrying your cellular phone near your device.   When traveling through airports, show security personnel your identification card to avoid being screened in the metal detectors.  Ask the security personnel to use the hand wand.   Avoid arc welding equipment, MRI testing (magnetic resonance imaging), TENS units (transcutaneous nerve stimulators).  Call the office for questions about other devices.   Avoid electrical appliances that are in poor condition or are not properly grounded.   Microwave ovens are safe to be near  or to operate.  Additional information for defibrillator patients should your device go off:   If your device goes off ONCE and you feel fine afterward, notify the device clinic nurses.   If your device goes off ONCE and you do not feel well afterward, call 911.   If your device goes off TWICE, call 911.   If your device goes off THREE times in one day, call 911.  DO NOT DRIVE YOURSELF OR A FAMILY MEMBER WITH A DEFIBRILLATOR TO THE HOSPITAL--CALL 911.    _____________________________________________________________________________________ Information on my medicine - Coumadin   (Warfarin)  This medication education was reviewed with me or my healthcare representative as part of my discharge preparation.  The pharmacist that spoke with me during my hospital stay was:  Lynelle Doctor, [facility]  Why was Coumadin prescribed for you? Coumadin was prescribed for you because you have a blood clot or a medical condition that can cause an increased risk of forming blood clots. Blood clots can cause serious health problems by blocking the flow of blood to the heart, lung, or brain. Coumadin can prevent harmful blood clots from forming. As a reminder your indication for Coumadin is:   Stroke Prevention Because Of Atrial Fibrillation  What test will check on my response to Coumadin? While on Coumadin (warfarin) you will need to have an INR test regularly to ensure that your dose is keeping you in the  desired range. The INR (international normalized ratio) number is calculated from the result of the laboratory test called prothrombin time (PT).  If an INR APPOINTMENT HAS NOT ALREADY BEEN MADE FOR YOU please schedule an appointment to have this lab work done by your health care provider within 7 days. Your INR goal is a number between:  2 to 3   What  do you need to  know  About  COUMADIN? Take Coumadin (warfarin) exactly as prescribed by your healthcare provider about the same time each day.  DO NOT stop  taking without talking to the doctor who prescribed the medication.  Stopping without other blood clot prevention medication to take the place of Coumadin may increase your risk of developing a new clot or stroke.  Get refills before you run out.  What do you do if you miss a dose? If you miss a dose, take it as soon as you remember on the same day then continue your regularly scheduled regimen the next day.  Do not take two doses of Coumadin at the same time.  Important Safety Information A possible side effect of Coumadin (Warfarin) is an increased risk of bleeding. You should call your healthcare provider right away if you experience any of the following:   Bleeding from an injury or your nose that does not stop.   Unusual colored urine (red or dark brown) or unusual colored stools (red or black).   Unusual bruising for unknown reasons.   A serious fall or if you hit your head (even if there is no bleeding).  Some foods or medicines interact with Coumadin (warfarin) and might alter your response to warfarin. To help avoid this:   Eat a balanced diet, maintaining a consistent amount of Vitamin K.   Notify your provider about major diet changes you plan to make.   Avoid alcohol or limit your intake to 1 drink for women and 2 drinks for men per day. (1 drink is 5 oz. wine, 12 oz. beer, or 1.5 oz. liquor.)  Make sure that ANY health care provider who prescribes medication for you knows that you are taking Coumadin (warfarin).  Also make sure the healthcare provider who is monitoring your Coumadin knows when you have started a new medication including herbals and non-prescription products.  Coumadin (Warfarin)  Major Drug Interactions  Increased Warfarin Effect Decreased Warfarin Effect  Alcohol (large quantities) Antibiotics (esp. Septra/Bactrim, Flagyl, Cipro) Amiodarone (Cordarone) Aspirin (ASA) Cimetidine (Tagamet) Megestrol (Megace) NSAIDs (ibuprofen, naproxen, etc.) Piroxicam  (Feldene) Propafenone (Rythmol SR) Propranolol (Inderal) Isoniazid (INH) Posaconazole (Noxafil) Barbiturates (Phenobarbital) Carbamazepine (Tegretol) Chlordiazepoxide (Librium) Cholestyramine (Questran) Griseofulvin Oral Contraceptives Rifampin Sucralfate (Carafate) Vitamin K   Coumadin (Warfarin) Major Herbal Interactions  Increased Warfarin Effect Decreased Warfarin Effect  Garlic Ginseng Ginkgo biloba Coenzyme Q10 Green tea St. Johns wort    Coumadin (Warfarin) FOOD Interactions  Eat a consistent number of servings per week of foods HIGH in Vitamin K (1 serving =  cup)  Collards (cooked, or boiled & drained) Kale (cooked, or boiled & drained) Mustard greens (cooked, or boiled & drained) Parsley *serving size only =  cup Spinach (cooked, or boiled & drained) Swiss chard (cooked, or boiled & drained) Turnip greens (cooked, or boiled & drained)  Eat a consistent number of servings per week of foods MEDIUM-HIGH in Vitamin K (1 serving = 1 cup)  Asparagus (cooked, or boiled & drained) Broccoli (cooked, boiled & drained, or raw & chopped) Brussel sprouts (cooked, or boiled &  drained) *serving size only =  cup Lettuce, raw (green leaf, endive, romaine) Spinach, raw Turnip greens, raw & chopped   These websites have more information on Coumadin (warfarin):  FailFactory.se; VeganReport.com.au;

## 2013-08-23 NOTE — Progress Notes (Signed)
ANTICOAGULATION CONSULT NOTE - Follow Up Consult  Pharmacy Consult for coumadin Indication: atrial fibrillation  Allergies  Allergen Reactions  . Codeine Rash    Patient Measurements: Height: 5\' 6"  (167.6 cm) Weight: 180 lb (81.647 kg) IBW/kg (Calculated) : 63.8   Vital Signs: Temp: 98.5 F (36.9 C) (02/04 0809) Temp src: Oral (02/04 0809) BP: 130/70 mmHg (02/04 0809) Pulse Rate: 58 (02/04 0809)  Labs:  Recent Labs  08/22/13 1000 08/22/13 2315 08/23/13 0310  HGB 17.6*  --  17.2*  HCT 49.8  --  49.6  PLT 238  --  234  LABPROT 27.4*  --  33.0*  INR 2.66*  --  3.39*  CREATININE 0.97  --  0.85  TROPONINI <0.30 <0.30 <0.30    Estimated Creatinine Clearance: 85.7 ml/min (by C-G formula based on Cr of 0.85).   Assessment: Patient is a 68 y.o M on coumadin for Afib.  INR increased noticeably from 2.66 to 3.39 today with home dose resumed yesterday on admission.  No bleeding documented.  Goal of Therapy:  INR 2-3    Plan:  1) hold coumadin dose today 2) f/u with AM labs  3) monitor for s/s of bleeding  Adilee Lemme P 08/23/2013,8:24 AM

## 2013-08-23 NOTE — Progress Notes (Signed)
UR completed 

## 2013-08-23 NOTE — CV Procedure (Signed)
Pre op Dx syncope Post op Dx  same  Procedure  Loop Recorder implantation  After routine prep and drape of the left parasternal area, a small incision was created. A Medtronic LINQ Reveal Loop Recorder  Serial Number  [id number] was inserted.    Dermabond was  applied.  The patient tolerated the procedure without apparent complication.

## 2013-08-23 NOTE — H&P (Signed)
Internal Medicine Attending Admission Note Date: 08/23/2013  Patient name: [name] Medical record number: [id number] Date of birth: Oct 04, 1945 Age: 68 y.o. Gender: male  I saw and evaluated the patient. I reviewed the resident's note and I agree with the resident's findings and plan as documented in the resident's note.  [name] is a 68 year old man with a history of atrial fibrillation status post ablation which is now paroxysmal and treated with chronic amiodarone, hypertension, gastroesophageal reflux disease, polycythemia, and alcohol and heroin abuse who presents after an episode of syncope. He was in his usual state of health until the day of admission when, after doing some household activities, he became short of breath. His dyspnea lasted about 1-2 minutes and was followed by loss of consciousness. Apparently this episode was witnessed by his wife but she is unavailable during the history. He denies any bowel or bladder incontinence, dizziness, palpitations, headache, weakness, or chest pain. His last dose apparently was the day prior to admission.  His physical exam is unremarkable this morning. He has ruled out for acute myocardial infarction with serial enzymes and EKGs. He was evaluated by Dr. Caryl Comes of electrophysiology, who feels ventricular tachycardia is not likely. It appears he is recommending insertion of a loop recorder to assess for possible paroxysmal atrial fibrillation as a cause of the prodrome consisting of shortness of breath with subsequent syncope.  We will arrange the loop recorder and discharge [name] home with followup in electrophysiology and in the [facility]. We will also try to arrange for outpatient pulmonary function tests with diffusion given his chronic amiodarone use.

## 2013-08-24 ENCOUNTER — Encounter (HOSPITAL_COMMUNITY): Payer: Self-pay | Admitting: Internal Medicine

## 2013-08-24 ENCOUNTER — Other Ambulatory Visit (HOSPITAL_COMMUNITY): Payer: Self-pay | Admitting: Internal Medicine

## 2013-08-24 ENCOUNTER — Observation Stay (HOSPITAL_COMMUNITY): Payer: Medicare Other

## 2013-08-24 DIAGNOSIS — Z9229 Personal history of other drug therapy: Secondary | ICD-10-CM

## 2013-08-24 DIAGNOSIS — R55 Syncope and collapse: Secondary | ICD-10-CM | POA: Diagnosis not present

## 2013-08-24 LAB — PULMONARY FUNCTION TEST
DL/VA % PRED: 152 %
DL/VA: 6.6 ml/min/mmHg/L
DLCO cor % pred: 103 %
DLCO cor: 27.82 ml/min/mmHg
DLCO unc % pred: 109 %
DLCO unc: 29.6 ml/min/mmHg
FEF 25-75 PRE: 0.92 L/s
FEF2575-%Pred-Pre: 40 %
FEV1-%Pred-Pre: 63 %
FEV1-Pre: 1.84 L
FEV1FVC-%PRED-PRE: 77 %
FEV6-%Pred-Pre: 85 %
FEV6-Pre: 3.14 L
FEV6FVC-%PRED-PRE: 104 %
FVC-%Pred-Pre: 81 %
FVC-Pre: 3.19 L
PRE FEV1/FVC RATIO: 58 %
PRE FEV6/FVC RATIO: 98 %

## 2013-08-24 LAB — PROTIME-INR
INR: 3.63 — AB (ref 0.00–1.49)
Prothrombin Time: 34.8 seconds — ABNORMAL HIGH (ref 11.6–15.2)

## 2013-08-24 LAB — CBC
HCT: 49.8 % (ref 39.0–52.0)
Hemoglobin: 17.1 g/dL — ABNORMAL HIGH (ref 13.0–17.0)
MCH: 34.5 pg — ABNORMAL HIGH (ref 26.0–34.0)
MCHC: 34.3 g/dL (ref 30.0–36.0)
MCV: 100.6 fL — ABNORMAL HIGH (ref 78.0–100.0)
Platelets: 212 10*3/uL (ref 150–400)
RBC: 4.95 MIL/uL (ref 4.22–5.81)
RDW: 17.7 % — AB (ref 11.5–15.5)
WBC: 13.9 10*3/uL — ABNORMAL HIGH (ref 4.0–10.5)

## 2013-08-24 LAB — GLUCOSE, CAPILLARY: GLUCOSE-CAPILLARY: 87 mg/dL (ref 70–99)

## 2013-08-24 LAB — T4, FREE: FREE T4: 1.54 ng/dL (ref 0.80–1.80)

## 2013-08-24 MED ORDER — AMIODARONE HCL 200 MG PO TABS
200.0000 mg | ORAL_TABLET | Freq: Every day | ORAL | Status: DC
  Administered 2013-08-24: 200 mg via ORAL
  Filled 2013-08-24: qty 1

## 2013-08-24 MED ORDER — CARVEDILOL 12.5 MG PO TABS
12.5000 mg | ORAL_TABLET | Freq: Two times a day (BID) | ORAL | Status: DC
  Filled 2013-08-24 (×2): qty 1

## 2013-08-24 NOTE — Progress Notes (Signed)
ANTICOAGULATION CONSULT NOTE - Follow Up Consult  Pharmacy Consult for coumadin Indication: atrial fibrillation  Allergies  Allergen Reactions  . Codeine Rash    Patient Measurements: Height: 5\' 6"  (167.6 cm) Weight: 181 lb 4.8 oz (82.237 kg) IBW/kg (Calculated) : 63.8   Vital Signs: Temp: 98.5 F (36.9 C) (02/05 0519) Temp src: Oral (02/05 0519) BP: 156/78 mmHg (02/05 0519) Pulse Rate: 65 (02/05 0519)  Labs:  Recent Labs  08/22/13 1000 08/22/13 2315 08/23/13 0310 08/24/13 0358  HGB 17.6*  --  17.2* 17.1*  HCT 49.8  --  49.6 49.8  PLT 238  --  234 212  LABPROT 27.4*  --  33.0* 34.8*  INR 2.66*  --  3.39* 3.63*  CREATININE 0.97  --  0.85  --   TROPONINI <0.30 <0.30 <0.30  --     Estimated Creatinine Clearance: 86.1 ml/min (by C-G formula based on Cr of 0.85).   Assessment: Patient is a 68 y.o M on coumadin for Afib.  INR continues to increase.  Last Coumadin dose was on 2/3.  No new medications or drug interactions noted.  Hopeful that INR will start to trend down tomorrow.  Noted possible plans for discharge today.   Goal of Therapy:  INR 2-3    Plan:  1) hold coumadin dose today 2) f/u with AM labs  3) if patient is discharged today, would recommend resuming home dose Fri 2/6 with INR follow-up on Mon 2/9  [facility], Pharm.D., BCPS Clinical Pharmacist Pager [phone] 08/24/2013 10:41 AM

## 2013-08-24 NOTE — Progress Notes (Signed)
Subjective:  Patient seen at bedside this AM. No complaints today. Loop recorder placed yesterday without complications.   Objective: Vital signs in last 24 hours: Filed Vitals:   08/23/13 2004 08/24/13 0519 08/24/13 0522 08/24/13 0835  BP: 148/72 156/78    Pulse: 67 65    Temp: 98.3 F (36.8 C) 98.5 F (36.9 C)    TempSrc: Oral Oral    Resp: 18 20    Height:      Weight:   181 lb 4.8 oz (82.237 kg)   SpO2: 96% 97%  96%   Weight change: 1 lb 4.8 oz (0.59 kg)  Intake/Output Summary (Last 24 hours) at 08/24/13 0846 Last data filed at 08/23/13 2005  Gross per 24 hour  Intake    720 ml  Output      0 ml  Net    720 ml   Physical Exam: General: Alert, cooperative, and in no apparent distress. Cooperative w/ exam. HEENT: Vision grossly intact, oropharynx clear and non-erythematous  Neck: Full range of motion without pain, supple, no lymphadenopathy or carotid bruits Lungs: Clear to ascultation bilaterally, normal work of respiration, no wheezes, rales, ronchi Heart: Regular rate and rhythm, no murmurs, gallops, or rubs Abdomen: Soft, non-tender, non-distended, normal bowel sounds Extremities: No cyanosis, clubbing, or edema Neurologic: Alert & oriented X3, cranial nerves II-XII intact, strength grossly intact, sensation intact to light touch  Lab Results: Basic Metabolic Panel:  Recent Labs Lab 08/22/13 1000 08/23/13 0310  NA 142 139  K 3.7 4.1  CL 103 99  CO2 25 26  GLUCOSE 160* 145*  BUN 12 17  CREATININE 0.97 0.85  CALCIUM 8.4 8.7  MG  --  1.6   CBC:  Recent Labs Lab 08/22/13 1000 08/23/13 0310 08/24/13 0358  WBC 10.6* 16.2* 13.9*  NEUTROABS 8.6*  --   --   HGB 17.6* 17.2* 17.1*  HCT 49.8 49.6 49.8  MCV 100.0 100.0 100.6*  PLT 238 234 212   Cardiac Enzymes:  Recent Labs Lab 08/22/13 1000 08/22/13 2315 08/23/13 0310  TROPONINI <0.30 <0.30 <0.30   BNP:  Recent Labs Lab 08/22/13 1000  PROBNP 2511.0*   Coagulation:  Recent Labs Lab  08/22/13 1000 08/23/13 0310 08/24/13 0358  LABPROT 27.4* 33.0* 34.8*  INR 2.66* 3.39* 3.63*   Urine Drug Screen: Drugs of Abuse     Component Value Date/Time   LABOPIA POSITIVE* 08/22/2013 1744   COCAINSCRNUR NONE DETECTED 08/22/2013 1744   LABBENZ NONE DETECTED 08/22/2013 1744   AMPHETMU NONE DETECTED 08/22/2013 1744   THCU NONE DETECTED 08/22/2013 1744   LABBARB NONE DETECTED 08/22/2013 1744    Studies/Results: Ct Angio Chest W/cm &/or Wo Cm  08/22/2013   CLINICAL DATA:  Difficulty breathing/respiratory distress  EXAM: CT ANGIOGRAPHY CHEST WITH CONTRAST  TECHNIQUE: Multidetector CT imaging of the chest was performed using the standard protocol during bolus administration of intravenous contrast. Multiplanar CT image reconstructions including MIPs were obtained to evaluate the vascular anatomy.  CONTRAST:  80 mL OMNIPAQUE IOHEXOL 350 MG/ML SOLN  COMPARISON:  Chest radiograph August 22, 2013  FINDINGS: There is no demonstrable pulmonary embolus. The aorta is somewhat diffusely ectatic. The ascending thoracic aorta is enlarged measuring 4.6 x 4.6 cm. There is no thoracic aortic dissection.  There is underlying centrilobular emphysema. There is lower lobe bronchiectatic change bilaterally.  There is patchy atelectatic change in both lower lobes, more on the right than on the left. There is mild atelectasis in the posterior aspects  of both upper lobes as well. There is no well-defined airspace consolidation.  There is no appreciable thoracic adenopathy. There is left ventricular hypertrophy. Pericardium is not thickened. There is a small hiatal hernia.  Visualized upper abdominal structures appear unremarkable. There are no blastic or lytic bone lesions. There is a dominant mass in the right lobe of the thyroid measuring 2.1 x 1.9 cm.  Review of the MIP images confirms the above findings.  IMPRESSION: No demonstrable pulmonary embolus.  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 Indeed, the entire  aorta is mildly ectatic. No dissection.  Areas of underlying emphysematous change. Lower lobe bronchiectatic change is noted bilaterally. There is patchy atelectasis in both upper and lower lobes, most notably in the right lower lobe. No well-defined consolidation.  There is a dominant mass in the right lobe of the thyroid. Nonemergent thyroid ultrasound advised to further assess.   Electronically Signed   By: Lowella Grip M.D.   On: 08/22/2013 11:35   Dg Chest Portable 1 View  08/22/2013   CLINICAL DATA:  Respiratory distress.  EXAM: PORTABLE CHEST - 1 VIEW  COMPARISON:  DG CHEST 2 VIEW dated 05/18/2013  FINDINGS: Mediastinum and hilar structures are normal. Cardiomegaly with mild pulmonary vascular prominence and interstitial prominence. Small pleural effusions bilaterally. These findings are consistent with CHF. No focal alveolar infiltrate. No pneumothorax. No acute osseous abnormality.  IMPRESSION: Findings consistent with CHF with mild pulmonary interstitial edema and small bilateral pleural effusions.   Electronically Signed   By: Marcello Moores  Register   On: 08/22/2013 20:17   Medications: I have reviewed the patient's current medications. Scheduled Meds: . amiodarone  200 mg Oral Daily  . carvedilol  12.5 mg Oral BID WC  . fluticasone  2 puff Inhalation BID  . furosemide  20 mg Oral Q breakfast  . hydroxyurea  500 mg Oral Q breakfast  . sodium chloride  3 mL Intravenous Q12H  . Warfarin - Pharmacist Dosing Inpatient   Does not apply q1800   Continuous Infusions:  PRN Meds:.albuterol  Assessment/Plan: [name] is a 68 y.o. male w/ PMHx of alcohol abuse/heroine abuse, HTN, h/o A-fib (on Coumadin), dCHF, GERD, presents to the ED w/ complaints of recent dyspnea, followed by loss of consciousness, admitted for suspected arrhythmia vs seizure.   Loss of consciousness- Likely 2/2 arrhythmia triggering a vasovagal event.  -Loop recorder placed yesterday. Patient to follow up with EP  as an outpatient. -Continue home Coreg + Amiodarone -Albuterol nebs q6h prn  -Flovent bid   Paroxysmal A-fib- On Amiodarone + Coreg and Coumadin for anti-coagulation at home. PAF likely a trigger for episode of syncope, as described above.  -Loop recorder placement as above -Continue Coreg, Amiodarone  -PFT's for surveillance while on Amiodarone -Continue Coumadin   dCHF (EF 50-55%)- Patient euvolemic on exam. Most recent ECHO w/ EF of 50-55%, no wall motion abnormalities. Improved from 40-45% w/ diffuse hypokinesis. No issues at this time.  -Continue Lasix 20 mg qd   Substance abuse- Patient denies recent alcohol abuse, but admits to heroine use, which he claims he snorted as recently as yesterday. UDS +ve for opiates. May have contributed to respiratory depression and LOC if used today.  -Counseled patient extensively regarding heroine abuse.  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 No issues at this time.  -Continue Coumadin  -  Continue Hydorxyurea   DVT Px- SCD's  Dispo: Discharge home today.  The patient does have a current PCP Cresenciano Genre, MD) and does need an [facility] hospital follow-up appointment after discharge.  The patient does not have transportation limitations that hinder transportation to clinic appointments.  .Services Needed at time of discharge: Y = Yes, Blank = No PT:   OT:   RN:   Equipment:   Other:     LOS: 2 days   Corky Sox, MD 08/24/2013, 8:46 AM

## 2013-08-24 NOTE — Progress Notes (Signed)
Pt is being discharged home. Pt has been provided with discharge instructions. RN went over instructions with the patient and answered all questions.

## 2013-08-24 NOTE — Discharge Summary (Signed)
Name: Dat Derksen MRN: [id number] DOB: [birth year] 68 y.o. PCP: Cresenciano Genre, MD  Date of Admission: 08/22/2013  9:27 AM Date of Discharge: 08/24/2013 Attending Physician: Karren Cobble, MD  Discharge Diagnosis: 1. Syncope- 2. Paroxysmal Atrial Fibrillation- 3. Chronic dCHF- 4. Substance Abuse- 5. Polycythemia-  Discharge Medications:   Medication List         albuterol 108 (90 BASE) MCG/ACT inhaler  Commonly known as:  PROVENTIL HFA;VENTOLIN HFA  Inhale 2 puffs into the lungs every 6 (six) hours as needed for wheezing.     amiodarone 200 MG tablet  Commonly known as:  PACERONE  Take 1 tablet (200 mg total) by mouth daily.     benzonatate 100 MG capsule  Commonly known as:  TESSALON  Take 1 capsule (100 mg total) by mouth 2 (two) times daily.     Budesonide 90 MCG/ACT inhaler  Commonly known as:  PULMICORT FLEXHALER  Inhale 2 puffs into the lungs 2 (two) times daily.     carvedilol 12.5 MG tablet  Commonly known as:  COREG  Take 1 tablet (12.5 mg total) by mouth 2 (two) times daily with a meal.     furosemide 40 MG tablet  Commonly known as:  LASIX  Take 0.5 tablets (20 mg total) by mouth daily.     hydroxyurea 500 MG capsule  Commonly known as:  HYDREA  Take 1 capsule (500 mg total) by mouth daily. May take with food to minimize GI side effects.     warfarin 5 MG tablet  Commonly known as:  COUMADIN  Take 2.5-5 mg by mouth daily. Take 1/2 tablet (2.5 mg) on Sunday,wednesday,friday and Saturday. Take 1 tablet (5 mg) on Monday,tuesday and thursday        Disposition and follow-up:   Mr.Alexavier Colombani was discharged from [facility] in Good condition.  At the hospital follow up visit please address:  1.  SOB, dizziness, lightheadedness, LOC, palpitations? Abstinence from heroine? Cardiology follow up.  2.  Labs / imaging needed at time of follow-up: EKG  3.  Pending labs/ test needing follow-up: none  Follow-up  Appointments:     Follow-up Information   Follow up with [facility] On 08/31/2013. (At 4:00 PM for wound check)    Specialty:  Cardiology   Contact information:   [address], [place] [address] [phone]      Follow up with Cristopher Peru, MD On 10/11/2013. (At 11:30 AM )    Specialty:  Cardiology   Contact information:   [address] [place] [address] [phone]       Follow up with Cresenciano Genre, MD On 09/07/2013. (1:45 PM)    Specialty:  Internal Medicine   Contact information:   [place] [address] [phone]       Discharge Instructions: Discharge Orders   Future Appointments Provider Department Dept Phone   08/31/2013 4:00 PM [facility] 1 [facility] [place] [facility] [phone]   09/05/2013 9:00 AM [facility] [place] [facility] [phone]   09/05/2013 9:30 AM Heath Lark, MD [place] [facility] [phone]   09/07/2013 1:45 PM Cresenciano Genre, MD [facility] [place] [phone]   09/11/2013 10:00 AM [facility] Coumadin [place] [facility] [place] [phone]   10/11/2013 11:30 AM Evans Lance, MD [place] [facility] [phone]  Future Orders Complete By Expires   Call MD for:  difficulty breathing, headache or visual disturbances  As directed    Call MD for:  extreme fatigue  As directed    Call MD for:  persistant dizziness or light-headedness  As directed    Call MD for:  redness, tenderness, or signs of infection (pain, swelling, redness, odor or green/yellow discharge around incision site)  As directed    Increase activity slowly  As directed       Consultations:  Cardiology  Procedures Performed:  Ct Angio Chest W/cm &/or Wo Cm  08/22/2013   CLINICAL DATA:  Difficulty breathing/respiratory distress  EXAM: CT ANGIOGRAPHY CHEST WITH CONTRAST  TECHNIQUE:  Multidetector CT imaging of the chest was performed using the standard protocol during bolus administration of intravenous contrast. Multiplanar CT image reconstructions including MIPs were obtained to evaluate the vascular anatomy.  CONTRAST:  80 mL OMNIPAQUE IOHEXOL 350 MG/ML SOLN  COMPARISON:  Chest radiograph August 22, 2013  FINDINGS: There is no demonstrable pulmonary embolus. The aorta is somewhat diffusely ectatic. The ascending thoracic aorta is enlarged measuring 4.6 x 4.6 cm. There is no thoracic aortic dissection.  There is underlying centrilobular emphysema. There is lower lobe bronchiectatic change bilaterally.  There is patchy atelectatic change in both lower lobes, more on the right than on the left. There is mild atelectasis in the posterior aspects of both upper lobes as well. There is no well-defined airspace consolidation.  There is no appreciable thoracic adenopathy. There is left ventricular hypertrophy. Pericardium is not thickened. There is a small hiatal hernia.  Visualized upper abdominal structures appear unremarkable. There are no blastic or lytic bone lesions. There is a dominant mass in the right lobe of the thyroid measuring 2.1 x 1.9 cm.  Review of the MIP images confirms the above findings.  IMPRESSION: No demonstrable pulmonary embolus.  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 Indeed, the entire aorta is mildly ectatic. No dissection.  Areas of underlying emphysematous change. Lower lobe bronchiectatic change is noted bilaterally. There is patchy atelectasis in both upper and lower lobes, most notably in the right lower lobe. No well-defined consolidation.  There is a dominant mass in the right lobe of the thyroid. Nonemergent thyroid ultrasound advised to further assess.   Electronically Signed   By: Lowella Grip M.D.   On: 08/22/2013 11:35   Dg Chest Portable 1 View  08/22/2013   CLINICAL DATA:  Respiratory distress.  EXAM: PORTABLE CHEST - 1 VIEW   COMPARISON:  DG CHEST 2 VIEW dated 05/18/2013  FINDINGS: Mediastinum and hilar structures are normal. Cardiomegaly with mild pulmonary vascular prominence and interstitial prominence. Small pleural effusions bilaterally. These findings are consistent with CHF. No focal alveolar infiltrate. No pneumothorax. No acute osseous abnormality.  IMPRESSION: Findings consistent with CHF with mild pulmonary interstitial edema and small bilateral pleural effusions.   Electronically Signed   By: Marcello Moores  Register   On: 08/22/2013 20:17   Admission HPI:  Mr. Yovanni Frenette is a 68 y.o. male w/ PMHx of alcohol abuse, HTN, A-fib (on Coumadin), CHF, GERD, and secondary polycythemia, presents to the ED w/ complaints of recent dyspnea, followed by loss of consciousness. Patient is a relatively poor historian, but said he was in his usual state of health, doing some normal household activities earlier today, when he became SOB, which lasted for about 1-2 minutes. This was then followed by loss of consciousness, witnessed by his wife (not at bedside). He  claims he bit his tongue (not obvious on exam), but denies bowel or bladder incontinence, no associated prodrome, aura, dizziness, lightheadedness, obvious palpitations, recent fever, or chills. According to EMS notes, the patient required bag-valve ventilation for "agonal " breathing in transport. On arrival to the ED, patient had complete return to baseline, w/ no EKG changes, SpO2 95-100%, and no obvious post-ictal state. 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  Patient has a cardiac history, involving atrial flutter and received ablation in 2012, something which he does not recall, now w/ paroxysmal A-fib on Amiodarone. The patient also claims had a possible "seizure" some time last year while on vacation, but does not recall the details.  Hospital Course by problem list:   1. Syncope- Patient  w/ a short episode of SOB, preceding loss of consciousness, likely 2/2 cardiac arrhythmia. Required bag valve mask in transit. On arrival to the ED, patient w/ complete return to baseline. EKG showed no abnormalities. Seizure very unlikely given the absence of a prodrome or aura, no bowel or bladder incontinence, and no post-ictal state. Patient w/ a history of A-flutter s/p ablation, now with PAF, on Amiodarone and Coreg (held initially during hospitalization). Patient claims to be complaint with his medications. Cardiology consulted, felt that a paroxysmal arrhythmia may have acted as a trigger for a vasovagal event, resulting in loss of consciousness. Patient w/ no events on telemetry. Cardiology placed loop recorder 08/23/13 top assess for paroxysmal arrythmia on discharge. Patient to follow up in [facility] clinic and w/ Cardiology.  2. Paroxysmal Atrial Fibrillation- Patient in NSR on admission and no EKG changes from previous hospital stay. On Amiodarone + Coreg at home, as well as Coumadin for anti-coagulation. Likely associated w/ event discussed above. Amiodarone and Coreg held during admission, rate and rhythm (regular) controlled, patient normotensive. Patient w/ previous history of atrial flutter, s/p ablation in 2012.   3. Chronic dCHF- Patient euvolemic on exam. Most recent ECHO w/ EF of 50-55% and no wall motion abnormalities. Improved from 40-45% w/ diffuse hypokinesis previously. No issues during this admission. Continued home Lasix 20 mg qd.  4. Substance Abuse- Patient denies recent alcohol abuse, but admits to heroine use, which he claims he snorted as recently as the day previous to admission. Patient denies IV drug use. UDS +ve for opiates. May also be contributor to SOB/LOC. Social work and myself counseled patient extensively about the risks of drug abuse.   5. 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 No issues during this admission. Initial Hb 17.6. Continued Hydorxyurea and Coumadin.   Discharge Vitals:   BP 156/78  Pulse 65  Temp(Src) 98.5 F (36.9 C) (Oral)  Resp 20  Ht _0  (1.676 m)  Wt 181 lb 4.8 oz (82.237 kg)  BMI 29.28 kg/m2  SpO2 96%  Discharge Labs:  Results for orders placed during the hospital encounter of 08/22/13 (from the past 24 hour(s))  TSH     Status: Abnormal   Collection Time    08/23/13  4:36 PM      Result Value Range   TSH 0.166 (*) 0.350 - 4.500 uIU/mL  PROTIME-INR     Status: Abnormal   Collection Time    08/24/13  3:58 AM      Result Value Range   Prothrombin Time 34.8 (*) 11.6 - 15.2 seconds   INR 3.63 (*)  0.00 - 1.49  CBC     Status: Abnormal   Collection Time    08/24/13  3:58 AM      Result Value Range   WBC 13.9 (*) 4.0 - 10.5 K/uL   RBC 4.95  4.22 - 5.81 MIL/uL   Hemoglobin 17.1 (*) 13.0 - 17.0 g/dL   HCT 49.8  39.0 - 52.0 %   MCV 100.6 (*) 78.0 - 100.0 fL   MCH 34.5 (*) 26.0 - 34.0 pg   MCHC 34.3  30.0 - 36.0 g/dL   RDW 17.7 (*) 11.5 - 15.5 %   Platelets 212  150 - 400 K/uL  GLUCOSE, CAPILLARY     Status: None   Collection Time    08/24/13  6:27 AM      Result Value Range   Glucose-Capillary 87  70 - 99 mg/dL  T4, FREE     Status: None   Collection Time    08/24/13  9:05 AM      Result Value Range   Free T4 1.54  0.80 - 1.80 ng/dL    Signed: Corky Sox, MD 08/24/2013, 1:39 PM   Time Spent on Discharge: 35 minutes Services Ordered on Discharge: none Equipment Ordered on Discharge: none

## 2013-08-24 NOTE — Progress Notes (Signed)
Patient Name: [name]      SUBJECTIVE: without complaints  Past Medical History  Diagnosis Date  . Cardiomyopathy   . Substance abuse     alcohol  . Hypertension   . CHF (congestive heart failure)     EF 35% with diffuse hypokinesis  . Atrial fibrillation /flutter     hx of flutter ablation 2/12  . Shortness of breath     "all the time lately" (12/14/2012)  . GERD (gastroesophageal reflux disease)   . Hepatitis     "think I had that once; a long time ago; from dirty needles I think" (12/14/2012)  . 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 Leukocytosis, unspecified 06/12/2013  . Myeloproliferative neoplasm 08/15/2013    Scheduled Meds:  Scheduled Meds: . amiodarone  200 mg Oral Daily  . carvedilol  12.5 mg Oral BID WC  . fluticasone  2 puff Inhalation BID  . furosemide  20 mg Oral Q breakfast  . hydroxyurea  500 mg Oral Q breakfast  . sodium chloride  3 mL Intravenous Q12H  . Warfarin - Pharmacist Dosing Inpatient   Does not apply q1800   Continuous Infusions:   PHYSICAL EXAM Filed Vitals:   08/23/13 1350 08/23/13 2004 08/24/13 0519 08/24/13 0522  BP: 136/74 148/72 156/78   Pulse: 65 67 65   Temp: 97.8 F (36.6 C) 98.3 F (36.8 C) 98.5 F (36.9 C)   TempSrc: Oral Oral Oral   Resp: 20 18 20    Height:      Weight:    181 lb 4.8 oz (82.237 kg)  SpO2: 97% 96% 97%     Well developed and nourished in no acute distress HENT normal Neck supple with JVP-flat Clear Regular rate and rhythm, no murmurs or gallops Abd-soft with active BS No Clubbing cyanosis edema Skin-warm and dry A & Oriented  Grossly normal sensory and motor function   TELEMETRY: Reviewed telemetry pt in nsr:    Intake/Output Summary (Last 24 hours) at 08/24/13 0825 Last data filed at 08/23/13 2005  Gross per 24 hour  Intake    720 ml  Output      0 ml  Net    720 ml    LABS: Basic Metabolic Panel:  Recent Labs Lab 08/22/13 1000 08/23/13 0310  NA 142 139  K 3.7  4.1  CL 103 99  CO2 25 26  GLUCOSE 160* 145*  BUN 12 17  CREATININE 0.97 0.85  CALCIUM 8.4 8.7  MG  --  1.6   Cardiac Enzymes:  Recent Labs  08/22/13 1000 08/22/13 2315 08/23/13 0310  TROPONINI <0.30 <0.30 <0.30   CBC:  Recent Labs Lab 08/22/13 1000 08/23/13 0310 08/24/13 0358  WBC 10.6* 16.2* 13.9*  NEUTROABS 8.6*  --   --   HGB 17.6* 17.2* 17.1*  HCT 49.8 49.6 49.8  MCV 100.0 100.0 100.6*  PLT 238 234 212   PROTIME:  Recent Labs  08/22/13 1000 08/23/13 0310 08/24/13 0358  LABPROT 27.4* 33.0* 34.8*  INR 2.66* 3.39* 3.63*   Liver Function Tests: No results found for this basename: AST, ALT, ALKPHOS, BILITOT, PROT, ALBUMIN,  in the last 72 hours No results found for this basename: LIPASE, AMYLASE,  in the last 72 hours BNP: BNP (last 3 results)  Recent Labs  03/20/13 0815 05/18/13 1616 08/22/13 1000  PROBNP 3120.0* 460.9* 2511.0*   D-Dimer: No results found for this basename: DDIMER,  in the last 72 hours Hemoglobin A1C:  No results found for this basename: HGBA1C,  in the last 72 hours Fasting Lipid Panel: No results found for this basename: CHOL, HDL, LDLCALC, TRIG, CHOLHDL, LDLDIRECT,  in the last 72 hours Thyroid Function Tests:  Recent Labs  08/23/13 1636  TSH 0.166*   Anemia Panel: No results found for this basename: VITAMINB12, FOLATE, FERRITIN, TIBC, IRON, RETICCTPCT,  in the last 72 hours      ASSESSMENT AND PLAN:  Active Problems:   Syncope suppressed TSH   Ok for discharge  Will arrange followup for loop His TSH wu is pending   Signed, Virl Axe MD  08/24/2013

## 2013-08-24 NOTE — Progress Notes (Signed)
Internal Medicine Attending  Date: 08/24/2013  Patient name: [name] Medical record number: [id number] Date of birth: 1945/12/13 Age: 68 y.o. Gender: male  I saw and evaluated the patient. I reviewed the resident's note by Dr. Ronnald Ramp and I agree with the resident's findings and plans as documented in his progress note.  Appreciate EP's assessment of possible atrial arrythmia resulting in a vasovagal event.  Loop recorder placed.  Agree with D/C to home today with follow-up in the [facility] and EP.

## 2013-08-25 ENCOUNTER — Encounter (HOSPITAL_COMMUNITY): Payer: Self-pay | Admitting: *Deleted

## 2013-08-26 ENCOUNTER — Other Ambulatory Visit: Payer: Self-pay | Admitting: Internal Medicine

## 2013-08-26 DIAGNOSIS — E079 Disorder of thyroid, unspecified: Secondary | ICD-10-CM | POA: Insufficient documentation

## 2013-08-26 NOTE — Discharge Summary (Signed)
Agree with the above.  Patient also incidentally found to have a right sided thyroid mass that will require further follow-up as an outpatient.  I will ask Dr. Ronnald Ramp to order the thyroid ultrasound to better characterize the right sided thyroid mass and Dr. Aundra Dubin to follow-up on the result and complete the necessary evaluation.

## 2013-08-31 ENCOUNTER — Ambulatory Visit (INDEPENDENT_AMBULATORY_CARE_PROVIDER_SITE_OTHER): Payer: Medicare Other | Admitting: *Deleted

## 2013-08-31 DIAGNOSIS — I4891 Unspecified atrial fibrillation: Secondary | ICD-10-CM

## 2013-08-31 LAB — MDC_IDC_ENUM_SESS_TYPE_INCLINIC

## 2013-08-31 NOTE — Progress Notes (Signed)
Wound check-ILR. No episodes noted.  Wound well healed.  ROV 3/25 with Dr. Lovena Le.

## 2013-09-05 ENCOUNTER — Ambulatory Visit (HOSPITAL_BASED_OUTPATIENT_CLINIC_OR_DEPARTMENT_OTHER): Payer: Medicare Other | Admitting: Hematology and Oncology

## 2013-09-05 ENCOUNTER — Ambulatory Visit (HOSPITAL_BASED_OUTPATIENT_CLINIC_OR_DEPARTMENT_OTHER): Payer: Medicare Other

## 2013-09-05 ENCOUNTER — Ambulatory Visit (HOSPITAL_COMMUNITY): Admission: RE | Admit: 2013-09-05 | Payer: Medicare Other | Source: Ambulatory Visit

## 2013-09-05 ENCOUNTER — Telehealth: Payer: Self-pay | Admitting: Hematology and Oncology

## 2013-09-05 ENCOUNTER — Other Ambulatory Visit (HOSPITAL_BASED_OUTPATIENT_CLINIC_OR_DEPARTMENT_OTHER): Payer: Medicare Other

## 2013-09-05 VITALS — BP 121/77 | HR 58 | Temp 98.1°F | Resp 18 | Ht 66.0 in | Wt 184.4 lb

## 2013-09-05 DIAGNOSIS — D45 Polycythemia vera: Secondary | ICD-10-CM

## 2013-09-05 DIAGNOSIS — D751 Secondary polycythemia: Secondary | ICD-10-CM

## 2013-09-05 DIAGNOSIS — D471 Chronic myeloproliferative disease: Secondary | ICD-10-CM

## 2013-09-05 DIAGNOSIS — F17200 Nicotine dependence, unspecified, uncomplicated: Secondary | ICD-10-CM

## 2013-09-05 DIAGNOSIS — Z720 Tobacco use: Secondary | ICD-10-CM

## 2013-09-05 LAB — CBC WITH DIFFERENTIAL/PLATELET
BASO%: 1.2 % (ref 0.0–2.0)
BASOS ABS: 0.1 10*3/uL (ref 0.0–0.1)
EOS%: 4.2 % (ref 0.0–7.0)
Eosinophils Absolute: 0.3 10*3/uL (ref 0.0–0.5)
HCT: 53 % — ABNORMAL HIGH (ref 38.4–49.9)
HEMOGLOBIN: 17.7 g/dL — AB (ref 13.0–17.1)
LYMPH%: 14.9 % (ref 14.0–49.0)
MCH: 33.8 pg — AB (ref 27.2–33.4)
MCHC: 33.4 g/dL (ref 32.0–36.0)
MCV: 101.1 fL — ABNORMAL HIGH (ref 79.3–98.0)
MONO#: 0.5 10*3/uL (ref 0.1–0.9)
MONO%: 6.5 % (ref 0.0–14.0)
NEUT#: 5.9 10*3/uL (ref 1.5–6.5)
NEUT%: 73.2 % (ref 39.0–75.0)
Platelets: 200 10*3/uL (ref 140–400)
RBC: 5.24 10*6/uL (ref 4.20–5.82)
RDW: 18.1 % — AB (ref 11.0–14.6)
WBC: 8 10*3/uL (ref 4.0–10.3)
lymph#: 1.2 10*3/uL (ref 0.9–3.3)

## 2013-09-05 MED ORDER — HYDROXYUREA 500 MG PO CAPS: 1500.0000 mg | ORAL_CAPSULE | Freq: Every day | ORAL | Status: DC

## 2013-09-05 NOTE — Progress Notes (Signed)
Phlebotomy performed in left forearm with 18Gx1.16 in angiocath.  537gms of blood obtained and wasted.  Pt tolerated procedure without difficulty.  Nourishments given. 1220 -  Pt was discharged in stable condition via ambulation.  AVS printed and given to pt.

## 2013-09-05 NOTE — Progress Notes (Signed)
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 In November 2014, we removed one unit of blood and order an additional workup to rule out myeloproliferative disorder. Blood test result confirmed low erythropoietin level along with detectable JAK 2 mutation, confirmed myeloproliferative disorder Unfortunately, bone marrow biopsy on 06/26/2013 was nondiagnostic On 07/31/13 the patient was started on 1 hydroxyurea a day On 08/15/2013, increased hydroxyurea to 2 tablets a day and remove one more unit of blood due to the high hemoglobin On 09/05/2013, I increased hydroxyurea to 3 tablets a day and remove one more unit of blood due to high hemoglobin and hematocrit  INTERVAL HISTORY: [name] 68 y.o. male returns for further followup. Recently he was admitted to the hospital for syncopal episode. He is attempting to quit smoking, down to 2 cigarettes a day. He denies any further chest pain, dizziness, palpitation. Denies any side effects from hydroxyurea. He denies any recent fever, chills, night sweats or abnormal weight loss He tolerated a prior phlebotomy sessions well without side effects.   I have reviewed the past medical history, past surgical history, social history and family history with the patient and they are unchanged from previous note.  ALLERGIES:  is allergic to codeine.  MEDICATIONS:  Current Outpatient Prescriptions  Medication Sig Dispense Refill  . albuterol (PROVENTIL HFA;VENTOLIN HFA) 108 (90 BASE) MCG/ACT inhaler Inhale 2 puffs into the lungs every 6 (six) hours as needed for wheezing.  1 Inhaler  2  . amiodarone (PACERONE)  200 MG tablet Take 1 tablet (200 mg total) by mouth daily.  30 tablet  5  . benzonatate (TESSALON) 100 MG capsule Take 1 capsule (100 mg total) by mouth 2 (two) times daily.  20 capsule  0  . Budesonide (PULMICORT FLEXHALER) 90 MCG/ACT inhaler Inhale 2 puffs into the lungs 2 (two) times daily.  1 Inhaler  3  . carvedilol (COREG) 12.5 MG tablet Take 1 tablet (12.5 mg total) by mouth 2 (two) times daily with a meal.  180 tablet  1  . furosemide (LASIX) 40 MG tablet Take 0.5 tablets (20 mg total) by mouth daily.  30 tablet  1  . hydroxyurea (HYDREA) 500 MG capsule Take 500 mg by mouth 2 (two) times daily. May take with food to minimize GI side effects.      [name] lisinopril (PRINIVIL,ZESTRIL) 5 MG tablet Take 5 mg by mouth daily.      [name] warfarin (COUMADIN) 5 MG tablet Take 2.5-5 mg by mouth daily. Take 1/2 tablet (2.5 mg) on Sunday,wednesday,friday and Saturday. Take 1 tablet (5 mg) on Monday,tuesday and thursday      . hydroxyurea (HYDREA) 500 MG capsule Take 3 capsules (1,500 mg total) by mouth daily. May take with food to minimize GI side effects.  90 capsule  3   No current facility-administered medications for this visit.    REVIEW OF SYSTEMS:   Constitutional: Denies fevers, chills or abnormal weight loss Eyes: Denies blurriness of vision Ears, nose, mouth, throat, and face: Denies mucositis or sore throat Respiratory: Denies cough, dyspnea or wheezes Cardiovascular: Denies palpitation, chest discomfort or lower extremity swelling Gastrointestinal:  Denies nausea, heartburn or change in bowel habits Skin: Denies abnormal  skin rashes Lymphatics: Denies new lymphadenopathy or easy bruising Neurological:Denies numbness, tingling or new weaknesses Behavioral/Psych: Mood is stable, no new changes  All other systems were reviewed with the patient and are negative.  PHYSICAL EXAMINATION: ECOG PERFORMANCE STATUS: 0 - Asymptomatic  Filed Vitals:   09/05/13 1014  BP: 121/77  Pulse: 58  Temp:  98.1 F (36.7 C)  Resp: 18   Filed Weights   09/05/13 1014  Weight: 184 lb 6.4 oz (83.643 kg)    GENERAL:alert, no distress and comfortable. The patient is morbidly obese. SKIN: skin color, texture, turgor are normal, no rashes or significant lesions EYES: normal, Conjunctiva are pink and non-injected, sclera clear OROPHARYNX:no exudate, no erythema and lips, buccal mucosa, and tongue normal  NECK: supple, thyroid normal size, non-tender, without nodularity LYMPH:  no palpable lymphadenopathy in the cervical, axillary or inguinal LUNGS: clear to auscultation and percussion with normal breathing effort HEART: regular rate & rhythm and no murmurs and no lower extremity edema ABDOMEN:abdomen soft, non-tender and normal bowel sounds Musculoskeletal:no cyanosis of digits and no clubbing  NEURO: alert & oriented x 3 with fluent speech, no focal motor/sensory deficits  LABORATORY DATA:  I have reviewed the data as listed    Component Value Date/Time   NA 139 08/23/2013 0310   NA 143 06/12/2013 1125   K 4.1 08/23/2013 0310   K 3.7 06/12/2013 1125   CL 99 08/23/2013 0310   CO2 26 08/23/2013 0310   CO2 32* 06/12/2013 1125   GLUCOSE 145* 08/23/2013 0310   GLUCOSE 92 06/12/2013 1125   BUN 17 08/23/2013 0310   BUN 14.6 06/12/2013 1125   CREATININE 0.85 08/23/2013 0310   CREATININE 0.9 06/12/2013 1125   CREATININE 0.94 03/24/2013 1409   CALCIUM 8.7 08/23/2013 0310   CALCIUM 8.9 06/12/2013 1125   PROT 6.9 06/12/2013 1125   PROT 7.8 03/20/2013 0815   ALBUMIN 3.3* 06/12/2013 1125   ALBUMIN 3.5 03/20/2013 0815   AST 21 06/12/2013 1125   AST 51* 03/20/2013 0815   ALT 28 06/12/2013 1125   ALT 32 03/20/2013 0815   ALKPHOS 112 06/12/2013 1125   ALKPHOS 126* 03/20/2013 0815   BILITOT 0.82 06/12/2013 1125   BILITOT 0.4 03/20/2013 0815   GFRNONAA 89* 08/23/2013 0310   GFRAA >90 08/23/2013 0310    No results found for this basename: SPEP, UPEP,  kappa and lambda light chains    Lab Results  Component Value Date    WBC 8.0 09/05/2013   NEUTROABS 5.9 09/05/2013   HGB 17.7* 09/05/2013   HCT 53.0* 09/05/2013   MCV 101.1* 09/05/2013   PLT 200 09/05/2013      Chemistry      Component Value Date/Time   NA 139 08/23/2013 0310   NA 143 06/12/2013 1125   K 4.1 08/23/2013 0310   K 3.7 06/12/2013 1125   CL 99 08/23/2013 0310   CO2 26 08/23/2013 0310   CO2 32* 06/12/2013 1125   BUN 17 08/23/2013 0310   BUN 14.6 06/12/2013 1125   CREATININE 0.85 08/23/2013 0310   CREATININE 0.9 06/12/2013 1125   CREATININE 0.94 03/24/2013 1409      Component Value Date/Time   CALCIUM 8.7 08/23/2013 0310   CALCIUM 8.9 06/12/2013 1125   ALKPHOS 112 06/12/2013 1125   ALKPHOS 126* 03/20/2013 0815   AST 21 06/12/2013 1125   AST 51* 03/20/2013 0815   ALT 28 06/12/2013 1125   ALT 32 03/20/2013 0815   BILITOT 0.82 06/12/2013 1125  BILITOT 0.4 03/20/2013 0815     ASSESSMENT & PLAN:  #1 polycythemia vera Due to very high hemoglobin, I plan to order another unit of phlebotomy today and increase hydroxyurea to 3 tablets a day. In the meantime, he will continue taking warfarin to prevent risk of thrombosis. #2 tobacco abuse I spent time educating the patient and the importance of nicotine cessation and he is attempting to quit on his own.  Orders Placed This Encounter  Procedures  . CBC with Differential    Standing Status: Future     Number of Occurrences:      Standing Expiration Date: 09/05/2014   All questions were answered. The patient knows to call the clinic with any problems, questions or concerns. No barriers to learning was detected. I spent 25 minutes counseling the patient face to face. The total time spent in the appointment was 40 minutes and more than 50% was on counseling and review of test results     [facility], Wanda, MD 09/05/2013 1:22 PM

## 2013-09-05 NOTE — Patient Instructions (Signed)
Therapeutic Phlebotomy Therapeutic phlebotomy is the controlled removal of blood from your body for the purpose of treating a medical condition. It is similar to donating blood. Usually, about a pint (470 mL) of blood is removed. The average adult has 9 to 12 pints (4.3 to 5.7 L) of blood. Therapeutic phlebotomy may be used to treat the following medical conditions:  Hemochromatosis. This is a condition in which there is too much iron in the blood.  Polycythemia vera. This is a condition in which there are too many red cells in the blood.  Porphyria cutanea tarda. This is a disease usually passed from one generation to the next (inherited). It is a condition in which an important part of hemoglobin is not made properly. This results in the build up of abnormal amounts of porphyrins in the body.  Sickle cell disease. This is an inherited disease. It is a condition in which the red blood cells form an abnormal crescent shape rather than a round shape. LET YOUR CAREGIVER KNOW ABOUT:  Allergies.  Medicines taken including herbs, eyedrops, over-the-counter medicines, and creams.  Use of steroids (by mouth or creams).  Previous problems with anesthetics or numbing medicine.  History of blood clots.  History of bleeding or blood problems.  Previous surgery.  Possibility of pregnancy, if this applies. RISKS AND COMPLICATIONS This is a simple and safe procedure. Problems are unlikely. However, problems can occur and may include:  Nausea or lightheadedness.  Low blood pressure.  Soreness, bleeding, swelling, or bruising at the needle insertion site.  Infection. BEFORE THE PROCEDURE  This is a procedure that can be done as an outpatient. Confirm the time that you need to arrive for your procedure. Confirm whether there is a need to fast or withhold any medications. It is helpful to wear clothing with sleeves that can be raised above the elbow. A blood sample may be done to determine the  amount of red blood cells or iron in your blood. Plan ahead of time to have someone drive you home after the procedure. PROCEDURE The entire procedure from preparation through recovery takes about 1 hour. The actual collection takes about 10 to 15 minutes.  A needle will be inserted into your vein.  Tubing and a collection bag will be attached to that needle.  Blood will flow through the needle and tubing into the collection bag.  You may be asked to open and close your hand slowly and continuously during the entire collection.  Once the specified amount of blood has been removed from your body, the collection bag and tubing will be clamped.  The needle will be removed.  Pressure will be held on the site of the needle insertion to stop the bleeding. Then a bandage will be placed over the needle insertion site. AFTER THE PROCEDURE  Your recovery will be assessed and monitored. If there are no problems, as an outpatient, you should be able to go home shortly after the procedure.  Document Released: 12/08/2010 Document Revised: 09/28/2011 Document Reviewed: 12/08/2010 [employer] Patient Information 2014 [employer].  

## 2013-09-05 NOTE — Telephone Encounter (Signed)
gv pt appt schedule for march. pt to go to inf per desk nurse - inf made aware. pt sent to lobby awaiting inf.

## 2013-09-07 ENCOUNTER — Ambulatory Visit (INDEPENDENT_AMBULATORY_CARE_PROVIDER_SITE_OTHER): Payer: Medicare Other | Admitting: Internal Medicine

## 2013-09-07 ENCOUNTER — Ambulatory Visit (HOSPITAL_COMMUNITY)
Admission: RE | Admit: 2013-09-07 | Discharge: 2013-09-07 | Disposition: A | Discharge status: Home / Self Care | Payer: Medicare Other | Source: Ambulatory Visit | Attending: Internal Medicine | Admitting: Internal Medicine

## 2013-09-07 ENCOUNTER — Encounter: Payer: Self-pay | Admitting: Internal Medicine

## 2013-09-07 VITALS — BP 103/62 | HR 58 | Temp 97.0°F | Ht 66.0 in | Wt 190.3 lb

## 2013-09-07 DIAGNOSIS — F17200 Nicotine dependence, unspecified, uncomplicated: Secondary | ICD-10-CM

## 2013-09-07 DIAGNOSIS — E079 Disorder of thyroid, unspecified: Secondary | ICD-10-CM

## 2013-09-07 DIAGNOSIS — D751 Secondary polycythemia: Secondary | ICD-10-CM

## 2013-09-07 DIAGNOSIS — R0609 Other forms of dyspnea: Secondary | ICD-10-CM

## 2013-09-07 DIAGNOSIS — Z720 Tobacco use: Secondary | ICD-10-CM

## 2013-09-07 DIAGNOSIS — R55 Syncope and collapse: Secondary | ICD-10-CM

## 2013-09-07 DIAGNOSIS — R0600 Dyspnea, unspecified: Secondary | ICD-10-CM

## 2013-09-07 DIAGNOSIS — D45 Polycythemia vera: Secondary | ICD-10-CM

## 2013-09-07 DIAGNOSIS — D47Z9 Other specified neoplasms of uncertain behavior of lymphoid, hematopoietic and related tissue: Secondary | ICD-10-CM

## 2013-09-07 DIAGNOSIS — R9431 Abnormal electrocardiogram [ECG] [EKG]: Secondary | ICD-10-CM | POA: Insufficient documentation

## 2013-09-07 DIAGNOSIS — R0989 Other specified symptoms and signs involving the circulatory and respiratory systems: Secondary | ICD-10-CM

## 2013-09-07 DIAGNOSIS — D471 Chronic myeloproliferative disease: Secondary | ICD-10-CM

## 2013-09-07 DIAGNOSIS — I10 Essential (primary) hypertension: Secondary | ICD-10-CM

## 2013-09-07 DIAGNOSIS — I4891 Unspecified atrial fibrillation: Secondary | ICD-10-CM

## 2013-09-07 MED ORDER — LISINOPRIL 5 MG PO TABS: 5.0000 mg | ORAL_TABLET | Freq: Every day | ORAL | Status: DC

## 2013-09-07 MED ORDER — AMIODARONE HCL 200 MG PO TABS: 200.0000 mg | ORAL_TABLET | Freq: Every day | ORAL | Status: DC

## 2013-09-07 NOTE — Assessment & Plan Note (Addendum)
On coumadin F/u with Cardiology 09/2013 with loop recorder  Rx refill Amiodarone today, Coreg 12.5 mg bid  EKG noted Sinus bradycardia today. Pt is not sx'matic

## 2013-09-07 NOTE — Assessment & Plan Note (Signed)
Mostly with exertion last EF preserved but history of grade 1 diastolic dysfunction  Taking Lasix 20 mg qd  With smoking history may need pfts in the future

## 2013-09-07 NOTE — Assessment & Plan Note (Signed)
BP Readings from Last 3 Encounters:  09/07/13 103/62  09/05/13 110/66  09/05/13 121/77    Lab Results  Component Value Date   NA 139 08/23/2013   K 4.1 08/23/2013   CREATININE 0.85 08/23/2013    Assessment: Blood pressure control: controlled Progress toward BP goal:  at goal Comments: none  Plan: Medications:  continue current medications Rx refill Lisinopril 5 mg today.  On Coreg 12.5 mg bdi  Educational resources provided: Therapist, sports tools provided: other (see comments) Other plans: f/u in 3-4 months

## 2013-09-07 NOTE — Patient Instructions (Signed)
General Instructions: Please follow up with the heart doctors as scheduled and follow up with me in 3-4 months  Read all the information below Pick up your prescriptions    Treatment Goals:  Goals (1 Years of Data) as of 09/07/13         As of Today 09/05/13 09/05/13 08/24/13 08/24/13     Blood Pressure    . Blood Pressure < 140/90  115/69 110/66 121/77 142/75 156/78      Progress Toward Treatment Goals:  Treatment Goal 09/07/2013  Blood pressure at goal  Stop smoking smoking less    Self Care Goals & Plans:  Self Care Goal 09/07/2013  Manage my medications take my medicines as prescribed; bring my medications to every visit; refill my medications on time; follow the sick day instructions if I am sick  Monitor my health keep track of my blood pressure  Eat healthy foods eat more vegetables; eat fruit for snacks and desserts; eat baked foods instead of fried foods; eat foods that are low in salt; eat smaller portions; drink diet soda or water instead of juice or soda  Be physically active find an activity I enjoy  Stop smoking call [facility] (1-800-QUIT-NOW)  Meeting treatment goals maintain the current self-care plan    No flowsheet data found.   Care Management & Community Referrals:  Referral 09/07/2013  Referrals made for care management support none needed  Referrals made to community resources none    Smoking Cessation Quitting smoking is important to your health and has many advantages. However, it is not always easy to quit since nicotine is a very addictive drug. Often times, people try 3 times or more before being able to quit. This document explains the best ways for you to prepare to quit smoking. Quitting takes hard work and a lot of effort, but you can do it. ADVANTAGES OF QUITTING SMOKING  You will live longer, feel better, and live better.  Your body will feel the impact of quitting smoking almost immediately.  Within 20 minutes, blood pressure decreases. Your  pulse returns to its normal level.  After 8 hours, carbon monoxide levels in the blood return to normal. Your oxygen level increases.  After 24 hours, the chance of having a heart attack starts to decrease. Your breath, hair, and body stop smelling like smoke.  After 48 hours, damaged nerve endings begin to recover. Your sense of taste and smell improve.  After 72 hours, the body is virtually free of nicotine. Your bronchial tubes relax and breathing becomes easier.  After 2 to 12 weeks, lungs can hold more air. Exercise becomes easier and circulation improves.  The risk of having a heart attack, stroke, cancer, or lung disease is greatly reduced.  After 1 year, the risk of coronary heart disease is cut in half.  After 5 years, the risk of stroke falls to the same as a nonsmoker.  After 10 years, the risk of lung cancer is cut in half and the risk of other cancers decreases significantly.  After 15 years, the risk of coronary heart disease drops, usually to the level of a nonsmoker.  If you are pregnant, quitting smoking will improve your chances of having a healthy baby.  The people you live with, especially any children, will be healthier.  You will have extra money to spend on things other than cigarettes. QUESTIONS TO THINK ABOUT BEFORE ATTEMPTING TO QUIT You may want to talk about your answers with your caregiver.  Why do you want to quit?  If you tried to quit in the past, what helped and what did not?  What will be the most difficult situations for you after you quit? How will you plan to handle them?  Who can help you through the tough times? Your family? Friends? A caregiver?  What pleasures do you get from smoking? What ways can you still get pleasure if you quit? Here are some questions to ask your caregiver:  How can you help me to be successful at quitting?  What medicine do you think would be best for me and how should I take it?  What should I do if I need  more help?  What is smoking withdrawal like? How can I get information on withdrawal? GET READY  Set a quit date.  Change your environment by getting rid of all cigarettes, ashtrays, matches, and lighters in your home, car, or work. Do not let people smoke in your home.  Review your past attempts to quit. Think about what worked and what did not. GET SUPPORT AND ENCOURAGEMENT You have a better chance of being successful if you have help. You can get support in many ways.  Tell your family, friends, and co-workers that you are going to quit and need their support. Ask them not to smoke around you.  Get individual, group, or telephone counseling and support. Programs are available at [employer] and health centers. Call your local health department for information about programs in your area.  Spiritual beliefs and practices may help some smokers quit.  Download a "quit meter" on your computer to keep track of quit statistics, such as how long you have gone without smoking, cigarettes not smoked, and money saved.  Get a self-help book about quitting smoking and staying off of tobacco. [place] yourself from urges to smoke. Talk to someone, go for a walk, or occupy your time with a task.  Change your normal routine. Take a different route to work. Drink tea instead of coffee. Eat breakfast in a different place.  Reduce your stress. Take a hot bath, exercise, or read a book.  Plan something enjoyable to do every day. Reward yourself for not smoking.  Explore interactive web-based programs that specialize in helping you quit. GET MEDICINE AND USE IT CORRECTLY Medicines can help you stop smoking and decrease the urge to smoke. Combining medicine with the above behavioral methods and support can greatly increase your chances of successfully quitting smoking.  Nicotine replacement therapy helps deliver nicotine to your body without the negative effects  and risks of smoking. Nicotine replacement therapy includes nicotine gum, lozenges, inhalers, nasal sprays, and skin patches. Some may be available over-the-counter and others require a prescription.  Antidepressant medicine helps people abstain from smoking, but how this works is unknown. This medicine is available by prescription.  Nicotinic receptor partial agonist medicine simulates the effect of nicotine in your brain. This medicine is available by prescription. Ask your caregiver for advice about which medicines to use and how to use them based on your health history. Your caregiver will tell you what side effects to look out for if you choose to be on a medicine or therapy. Carefully read the information on the package. Do not use any other product containing nicotine while using a nicotine replacement product.  RELAPSE OR DIFFICULT SITUATIONS Most relapses occur within the first 3 months after quitting. Do not be discouraged if you start  smoking again. Remember, most people try several times before finally quitting. You may have symptoms of withdrawal because your body is used to nicotine. You may crave cigarettes, be irritable, feel very hungry, cough often, get headaches, or have difficulty concentrating. The withdrawal symptoms are only temporary. They are strongest when you first quit, but they will go away within 10 14 days. To reduce the chances of relapse, try to:  Avoid drinking alcohol. Drinking lowers your chances of successfully quitting.  Reduce the amount of caffeine you consume. Once you quit smoking, the amount of caffeine in your body increases and can give you symptoms, such as a rapid heartbeat, sweating, and anxiety.  Avoid smokers because they can make you want to smoke.  Do not let weight gain distract you. Many smokers will gain weight when they quit, usually less than 10 pounds. Eat a healthy diet and stay active. You can always lose the weight gained after you  quit.  Find ways to improve your mood other than smoking. FOR MORE INFORMATION  www.smokefree.gov  Document Released: 06/30/2001 Document Revised: 01/05/2012 Document Reviewed: 10/15/2011 [facility] Patient Information 2014 [place], [place].  Hypertension As your heart beats, it forces blood through your arteries. This force is your blood pressure. If the pressure is too high, it is called hypertension (HTN) or high blood pressure. HTN is dangerous because you may have it and not know it. High blood pressure may mean that your heart has to work harder to pump blood. Your arteries may be narrow or stiff. The extra work puts you at risk for heart disease, stroke, and other problems.  Blood pressure consists of two numbers, a higher number over a lower, 110/72, for example. It is stated as "110 over 72." The ideal is below 120 for the top number (systolic) and under 80 for the bottom (diastolic). Write down your blood pressure today. You should pay close attention to your blood pressure if you have certain conditions such as:  Heart failure.  Prior heart attack.  Diabetes  Chronic kidney disease.  Prior stroke.  Multiple risk factors for heart disease. To see if you have HTN, your blood pressure should be measured while you are seated with your arm held at the level of the heart. It should be measured at least twice. A one-time elevated blood pressure reading (especially in the Emergency Department) does not mean that you need treatment. There may be conditions in which the blood pressure is different between your right and left arms. It is important to see your caregiver soon for a recheck. Most people have essential hypertension which means that there is not a specific cause. This type of high blood pressure may be lowered by changing lifestyle factors such as:  Stress.  Smoking.  Lack of exercise.  Excessive weight.  Drug/tobacco/alcohol use.  Eating less salt. Most people do not  have symptoms from high blood pressure until it has caused damage to the body. Effective treatment can often prevent, delay or reduce that damage. TREATMENT  When a cause has been identified, treatment for high blood pressure is directed at the cause. There are a large number of medications to treat HTN. These fall into several categories, and your caregiver will help you select the medicines that are best for you. Medications may have side effects. You should review side effects with your caregiver. If your blood pressure stays high after you have made lifestyle changes or started on medicines,   Your medication(s) may need to  be changed.  Other problems may need to be addressed.  Be certain you understand your prescriptions, and know how and when to take your medicine.  Be sure to follow up with your caregiver within the time frame advised (usually within two weeks) to have your blood pressure rechecked and to review your medications.  If you are taking more than one medicine to lower your blood pressure, make sure you know how and at what times they should be taken. Taking two medicines at the same time can result in blood pressure that is too low. SEEK IMMEDIATE MEDICAL CARE IF:  You develop a severe headache, blurred or changing vision, or confusion.  You have unusual weakness or numbness, or a faint feeling.  You have severe chest or abdominal pain, vomiting, or breathing problems. MAKE SURE YOU:   Understand these instructions.  Will watch your condition.  Will get help right away if you are not doing well or get worse. Document Released: 07/06/2005 Document Revised: 09/28/2011 Document Reviewed: 02/24/2008 [facility] Patient Information 2014 [place].  Sncope  (Syncope)  El sncope es un episodio de [place]. La persona pierde la conciencia y cae al piso. Generalmente permanece inconsciente durante menos de 5 minutos. Puede tener algunas contracciones musculares durante  un mximo de 15 segundos antes de despertar y Location manager a la normalidad. El sncope se presenta con mayor frecuencia en personas de edad avanzada, pero puede ocurrir a Hotel manager. Aunque la [employer] de las causas no implican un peligro, el sncope puede ser un signo de un problema mdico grave. Es importante buscar atencin mdica.  CAUSAS  La causa del sncope es una disminucin sbita del flujo de sangre al cerebro. Generalmente la causa especfica no puede determinarse. Los factores que pueden desencadenar el sncope son:   El uso de medicamentos que disminuyen la presin arterial.  Los cambios sbitos de [place], como por ejemplo al ponerse de pie.  Tomar ms dosis de medicamento que lo recetado.  Permanecer de pie en un lugar por [employer].  Sufrir convulsiones.  Deshidratacin y exposicin excesiva al calor.  Bajo nivel de Dispensing optician (hipoglucemia).  Dificultad para mover el intestino.  Enfermedades cardacas, latidos cardacos irregulares u otros problemas circulatorios.  [place], estrs emocional, visin de Armed forces technical officer intenso. SNTOMAS  Inmediatamente antes de desmayarse podr:   Sentirse mareado o aturdido.  Sentir nuseas.  Ver todo blanco o negro en el campo de la visin.  Tiene la piel fra y pegajosa. DIAGNSTICO  El mdico le preguntar acerca de sus sntomas, realizar un examen fsico y un electrocardiograma (ECG) para registrar la actividad elctrica del corazn. Tambin podr indicarle otras pruebas cardacas o anlisis de sangre para determinar las causas.  TRATAMIENTO  En la [employer], no se necesita tratamiento. Segn la causa del sncope, el mdico podr recomendarle que cambie o deje de tomar algunos de sus medicamentos.  INSTRUCCIONES PARA EL CUIDADO EN EL HOGAR   Pdale a alguien que se quede con usted hasta que se sienta estable.  No conduzca vehculos, no use maquinarias ni practique deportes hasta que el mdico lo  autorice.  [place]umpla con todas las visitas de control, segn le indique su mdico.  Recustese inmediatamente si siente que va a desmayarse. Respire profundamente y de [place] continua. Espere hasta que los sntomas hayan desaparecido.  Debe ingerir gran cantidad de lquido para mantener la orina de tono claro o color amarillo plido.  Si est tomando medicamentos para la  presin arterial o para el corazn, pngase de pie lentamente, tmese algunos minutos para permanecer sentado y luego prese. Esto reduce los mareos. SOLICITE ATENCIN MDICA DE INMEDIATO SI:   Sufre un dolor intenso de [place].  Siente un dolor intenso inusual en el pecho, el abdomen, o la espalda.  Tiene un sangrado por la boca o el recto, o la materia fecal es de color negro o aspecto alquitranado.  Siente latidos irregulares o muy rpidos.  Siente dolor al respirar.  Sufre episodios de [employer] repetidos o temblores como sacudidas durante un episodio.  Se desmaya mientras se encuentra sentado o acostado.  Se siente repentinamente confuso.  Tiene problemas para caminar.  Siente debilidad intensa.  Tiene problemas de visin. Si se desmaya, llame al servicio de emergencia de su localidad (911 en los [place]). No conduzca por sus propios medios [employer] hospital.  ASEGRESE DE QUE:   Comprende estas instrucciones.  Controlar su enfermedad.  Solicitar ayuda de inmediato si no mejora o empeora. Document Released: 04/15/2005 Document Revised: 03/30/2012 [facility] Patient Information 2014 [place], [place].   Dejar de fumar  (Smoking Cessation) Dejar de fumar es importante para su salud y tiene Product/process development scientist. Sin embargo, no siempre es Control and instrumentation engineer de fumar ya que la nicotina es una droga Aristes. Generalmente las personas intentan 3 veces o ms antes de poder dejar de fumar. En este documento se explican las mejores formas de prepararse para dejar de fumar. Esta decisin requiere [employer] y  mucho esfuerzo, pero usted puede [place].  [place]   Vivir ms, se sentir mejor y vivir mejor.  El cuerpo sentir el impacto de dejar de fumar de inmediato.  Luego de 20 minutos la presin arterial disminuye. El pulso vuelve a su nivel normal.  Despus de 8 horas, los niveles de monxido de carbono en la sangre vuelven a la normalidad. Aumenta el nivel de oxgeno.  Despus de 24 horas, la probabilidad de infarto comienza a disminuir. La respiracin, el cabello y el cuerpo ya no huelen a humo.  Luego de 48 horas, los nervios daados comienzan a recuperarse. Mejoran el sentido del gusto y Armed forces logistics/support/administrative officer.  Luego de 72 horas, el organismo est virtualmente libre de nicotina. Los conductos bronquiales se relajan, la respiracin se normaliza.  Despus de 2 a 12 semanas, los pulmones pueden contener ms aire. Se facilita la actividad fsica y mejora la respiracin.  El riesgo de sufrir un infarto, un ictus, cncer o enfermedad pulmonar disminuye en gran medida.  Despus de 1 ao, el riesgo de coronariopatas disminuye a la mitad.  Despus de 5 aos, el riesgo de ictus disminuye al nivel de un no fumador.  Despus de 10 aos, el riesgo de cncer de pulmn disminuye a la mitad, y el riesgo de sufrir otros tipos de cncer disminuye considerablemente.  Despus de 15 aos, el riesgo de enfermedad coronaria disminuye, generalmente al nivel de un no fumador.  Si est embarazada, al dejar de fumar aumentar las probabilidades de tener un beb sano.  Las personas con las que convive, [employer] nios, estarn ms saludables.  Tendr dinero extra para gastar en otras cosas que no sean cigarrillos. PREGUNTAS PARA PENSAR ANTES DE Allena Katz DEJAR DE FUMAR  Quizs desee hablar acerca de sus preguntas con el mdico.   Por qu desea dejar de fumar?  Cuando trat de dejar de fumar en el pasado, qu lo ayud y qu no lo ayud?  [place]  ms difciles para  usted despus de dejar de fumar? Cmo planea manejarlas?  Quin puede ayudarlo en los momentos difciles? Su familia? Sus amigos? Un profesional?  Qu placeres obtiene cuando fuma? De qu manera puede seguir obteniendo placer si abandona el hbito? Estas son algunas preguntas para hacrselas al profesional.   Cmo puede ayudarme a dejar de fumar con xito?  Qu medicamento cree que sera el mejor para m, y cmo debo tomarlo?  Qu debo hacer si necesito ms ayuda?  Cmo es la desintoxicacin del cigarrillo? Cmo puedo obtener informacin acerca de la desintoxicacin? Preprese   Establezca una fecha para dejar de fumar.  Cambie su entorno, deshacindose de los cigarrillos, ceniceros, fsforos y encendedores en su casa, el auto o el [place]. No permita que nadie fume dentro de su casa.  Repase sus intentos anteriores. Piense en qu cosas funcionaron y cules no. BUSQUE AYUDA Y ESTMULO  Usted tiene mejores probabilidades de tener xito si cuenta con ayuda. Puede obtener apoyo de [employer]:   [place] a sus familiares, amigos y compaeros de trabajo que usted dejar de fumar y que necesita su apoyo. Pdales que no fumen a su alrededor.  Obtenga consejo y 37 individual, grupal o telefnico. Hay programas que se ofrecen en hospitales y centros mdicos locales. Comunquese con el departamento de salud de su localidad para obtener informacin acerca de los programas disponibles en su rea.  Las creencias y prcticas espirituales pueden ayudar a los fumadores a abandonar el hbito.  Descargue en su computadora un programa que registre sus estadsticas, por ejemplo, cunto hace que no fuma, la cantidad de cigarrillos que no ha fumado y el dinero ahorrado.  Consiga un libro de [place] sobre dejar de fumar y Dispensing optician del tabaco. Aprenda nuevas destrezas y conductas   Trate de entretenerse con otra cosa cuando sienta ganas de fumar. Hable con alguien, salga a caminar u  ocpese en alguna tarea.  Cambie su rutina habitual. Salley Hews ruta diferente para llegar al [place]. Beba t en vez de caf. Desayune en un lugar diferente.  Reduzca las situaciones de estrs. Tome un bao caliente, practique alguna actividad fsica o lea un libro.  Planee hacer cada da algo que disfrute. Recompnsese por no fumar.  Explore programas interactivos en la web dedicados a ayudar a dejar de fumar. CONSIGA MEDICAMENTOS Y SELOS CORRECTAMENTE  Algunos medicamentos pueden ayudar a dejar de fumar y Equities trader la necesidad de tabaco. Oceanographer los medicamentos con las conductas y mtodos de apoyo ya mencionados puede aumentar en gran medida sus posibilidades de dejar de fumar con xito.   La terapia de reemplazo de nicotina enva nicotina al organismo sin los efectos negativos y los riesgos del fumar. La terapia de reemplazo de nicotina incluye chicles, pastillas, inhaladores nasales en aerosol y parches para la piel de nicotina. Algunos son de Rogelia Rohrer y otros requieren una receta mdica.  Los antidepresivos ayudan a las personas a Personal assistant de [place], Armed forces training and education officer no se conoce cul es el mecanismo. Se venden bajo receta mdica.  Los [employer] parciales de los receptores de nicotina simulan el efecto de la nicotina en el cerebro. Se venden bajo receta mdica. Pdale a su mdico que lo aconseje [employer] que debe Risk manager y cmo utilizarlos en base a su historia clnica. El mdico le dir qu efectos secundarios deber Best boy en cuenta si decide utilizar un medicamento o seguir un tratamiento. Lea cuidadosamente la informacin en el envase. No utilice cualquier otro producto que contenga nicotina  durante el uso de un producto de reemplazo de nicotina.  [place] parte de las recadas se producen dentro de los 3 primeros meses de abandonar el hbito. No  se desanime si comienza a fumar de nuevo. Recuerde, la [employer] tratan varias veces de  dejar de fumar antes de lograrlo. Podr sufrir sndrome de abstinencia porque su cuerpo est acostumbrado a la nicotina. Podr sentir el deseo compulsivo de fumar, irritabilidad, enojo, tos, cefaleas y dificultad para concentrarse. Estos sntomas son transitorios. Son ms intensos en un comienzo, pero desaparecern en 10 a 14 das.  Para reducir las probabilidades de fracaso, trate de:   Evitar el consumo alcohol. El beber disminuye sus posibilidades de xito.  Disminuya el consumo de cafena. Una vez que deje de fumar, la cantidad de cafena en su organismo aumenta y puede darle sntomas, como frecuencia cardaca rpida, sudoracin y ansiedad.  Evite a las [employer] fuman porque pueden hacer que usted desee [place].  No deje que el aumento de peso distraiga su objetivo. Muchos fumadores aumentarn de peso cuando dejen de fumar, generalmente menos de 4,5 Kg. Consuma una dieta saludable y [place]. Siempre podr perder [employer] que se gane despus de dejar de fumar.  Encuentre formas de mejorar su estado de nimo que no sean fumando PARA OBTENER MS INFORMACIN  www.smokefree.gov  Document Released: 07/06/2005 Document Revised: 01/05/2012 [facility] Patient Information 2014 [place], [place].  Hipertensin (Hypertension) Cuando el corazn late [employer] sangre a travs de las arterias. La fuerza que se origina es la presin arterial. Si la presin es demasiado elevada, se denomina hipertensin. El peligro radica en que puede sufrirla y no saberlo. Hipertensin puede significar que su corazn debe trabajar ms intensamente para bombear sangre. Las arterias pueden Insurance risk surveyor o rgidas. El [place] extra [place] el riesgo de enfermedades cardacas, ictus y [employer].  La presin arterial est formada por dos nmeros: el nmero mayor sobre el nmero menor, por ejemplo110/70. Se seala "110/72". Los valores ideales son por debajo de 120 para el nmero ms alto (sistlica) y por debajo  de 80 para el ms bajo (diastlica). Anote su presin sangunea hoy. Debe prestar mucha atencin a su presin arterial si sufre alguna otra enfermedad como:  Insuficiencia cardaca  Ataques cardiacos previos  Diabetes  Enfermedad renal crnica  Ictus previo  Mltiples factores de riesgo para enfermedades cardacas Para diagnosticar si usted sufre hipertensin arterial, debe medirse la presin mientras encuentra sentado con el brazo elevado a la altura del nivel del corazn. Debe medirse al menos 2 veces. Una nica lectura de presin arterial elevada (especialmente en el servicio de emergencias) no significa que necesita tratamiento. Hay enfermedades en las que la presin arterial es diferente en ambos brazos. Es importante que consulte rpidamente con su mdico para un control. La [employer] de las personas sufren hipertensin esencial, lo que significa que no tiene una causa especfica. Este tipo de hipertensin puede bajarse modificando algunos factores en el estilo de vida como:  Psychologist, forensic.  El consumo de cigarrillos.  La falta de actividad fsica.  Peso excesivo  Consumo de drogas y alcohol.  Consumiendo menos sal La mayora de las personas no tienen sntomas hasta que la hipertensin ocasiona un dao en el organismo. El tratamiento efectivo puede evitar, Network engineer o reducir ese dao. TRATAMIENTO: El tratamiento para la hipertensin, cuando se ha identificado una causa, est dirigido a la misma Hay un gran nmero en medicamentos para tratarla. Se  agrupan en diferentes categoras y Office manager los medicamentos indicados para usted. Muchos medicamentos disponibles tienen efectos secundarios. Debe comentar los efectos secundarios con su mdico. Si la presin arterial permanece elevada despus de modificar su estilo de vida o comenzar a tomar medicamentos:  Los medicamentos deben ser reemplazados  Puede ser necesario evaluar otros problemas.  Debe estar seguro que comprende  las indicaciones, que sabe cmo y cundo [employer].  Asegrese de Optometrist un control con su mdico dentro del tiempo indicado (generalmente dentro de las [employer]) para volver a Mudlogger presin arterial y Health visitor los medicamentos prescritos.  Si est tomando ms de un medicamento para la presin arterial, asegrese que sabe cmo y en qu momentos debe tomarlos. Tomar los medicamentos al mismo tiempo puede dar como resultado un gran descenso en la presin arterial. SOLICITE ATENCIN MDICA DE INMEDIATO SI PRESENTA:  Dolor de cabeza intenso, visin borrosa o cambios en la visin, o confusin.  Debilidad o adormecimientos inusuales o sensacin de desmayo.  Dolor de pecho o abdominal intenso, vmitos o problemas para respirar. ASEGRESE QUE:   Comprende estas instrucciones.  Controlar su enfermedad.  Solicitar ayuda inmediatamente si no mejora o si empeora. Document Released: 07/06/2005 Document Revised: 09/28/2011 [facility] Patient Information 2014 [place], [place].

## 2013-09-07 NOTE — Assessment & Plan Note (Signed)
Follows with H/o  On hydrea

## 2013-09-07 NOTE — Progress Notes (Addendum)
   Subjective:    Patient ID: [name], male    DOB: 1946/05/14, 68 y.o.   MRN: [id number]  HPI Comments: 68 y.o [place] male PMH cardiomyopathy EF 40-45%>50-55% with grade 1 diastolic dysfunction, ,h/o alcohol abuse (still drinking occasionally), h/o tobacco abuse, heroin abuse, secondary polycythemia, HTN (BP 115/69 today), h/o atrial flutter and atrial fibrillation with RVR s/p ablation with recurrent AF 08/2012 with h/o noncompliance of medications (now appears taking med more compliantly), on Coumadin, h.o [place] pertussis +, thyroid mass, tobacco abuse, polycythemia vera/MPD follows with oncology on Hydrea.   He presents for HFU for syncope and sob thought to be 2/2 cardiac arrhythmmia.  Loop recorder is in place and he has f/u with cards 3/10 and 10/11/13. He denies syncope since d/c.  Denies dizziness or lightheadedness.  He has sob with walking stairs.  He denies palpitations.  Per d/c summ he needs EKG, thyroid US. Orthostatics checked and negative.    He needs Rx refills of Lisinopril 5 mg and Amio 200 mg qd   He is still smoking from 1 ppd to 2-3 cigarettes daily   He had thyroid mass noted on imaging and needs thyroid US. Pending  Pt states he has not done heroin for 2-3 weeks   ROS per above          Review of Systems     Objective:   Physical Exam  Nursing note and vitals reviewed. Constitutional: He is oriented to person, place, and time. Vital signs are normal. He appears well-developed and well-nourished. He is cooperative.  HENT:  Head: Normocephalic and atraumatic.  Mouth/Throat: No oropharyngeal exudate.  Eyes: Conjunctivae are normal. Pupils are equal, round, and reactive to light. Right eye exhibits no discharge. Left eye exhibits no discharge. No scleral icterus.  Cardiovascular: Regular rhythm, S1 normal, S2 normal and normal heart sounds.  Bradycardia present.   No murmur heard. 1-2 + lower extremity edema   Pulmonary/Chest: Effort  normal and breath sounds normal.  Abdominal: Soft. Bowel sounds are normal. He exhibits no distension. There is no tenderness.  Obese   Musculoskeletal: He exhibits no edema.  Neurological: He is alert and oriented to person, place, and time. Gait normal.  Skin: Skin is warm, dry and intact. No rash noted.  Psychiatric: He has a normal mood and affect. His speech is normal and behavior is normal. Judgment and thought content normal. Cognition and memory are normal.          Assessment & Plan:  F/u 3-4 months

## 2013-09-07 NOTE — Assessment & Plan Note (Signed)
No more episodes since hosp. D/c.  Will follow with cardiology for loop recorder 09/2013  EKG today  Orthostatics negative

## 2013-09-07 NOTE — Assessment & Plan Note (Signed)
Follows with H/O taking Hydrea 500 mg tid

## 2013-09-07 NOTE — Assessment & Plan Note (Signed)
Pending thyroid US.  

## 2013-09-07 NOTE — Assessment & Plan Note (Signed)
  Assessment: Progress toward smoking cessation:  smoking less Barriers to progress toward smoking cessation:  none (trying to quit) Comments: trying to quit   Plan: Instruction/counseling given:  I counseled patient on the dangers of tobacco use, advised patient to stop smoking, and reviewed strategies to maximize success. Educational resources provided:  [employer] account manager) brochure Self management tools provided:  other (see comments) Medications to assist with smoking cessation:  None Patient agreed to the following self-care plans for smoking cessation: call [facility] (1-800-QUIT-NOW)  Other plans: keep reassessing

## 2013-09-08 ENCOUNTER — Ambulatory Visit (HOSPITAL_COMMUNITY): Admission: RE | Admit: 2013-09-08 | Payer: Medicare Other | Source: Ambulatory Visit

## 2013-09-11 ENCOUNTER — Ambulatory Visit (INDEPENDENT_AMBULATORY_CARE_PROVIDER_SITE_OTHER): Payer: Medicare Other | Admitting: Pharmacist

## 2013-09-11 DIAGNOSIS — Z7901 Long term (current) use of anticoagulants: Secondary | ICD-10-CM

## 2013-09-11 DIAGNOSIS — I4891 Unspecified atrial fibrillation: Secondary | ICD-10-CM

## 2013-09-11 LAB — POCT INR: INR: 3.1

## 2013-09-11 NOTE — Progress Notes (Signed)
Case discussed with Dr. McLean at time of visit.  We reviewed the resident's history and exam and pertinent patient test results.  I agree with the assessment, diagnosis, and plan of care documented in the resident's note.  

## 2013-09-11 NOTE — Patient Instructions (Signed)
Patient instructed to take medications as defined in the Anti-coagulation Track section of this encounter.  Patient instructed to take today's dose.  Patient verbalized understanding of these instructions.    

## 2013-09-11 NOTE — Progress Notes (Signed)
Anti-Coagulation Progress Note  [name] is a 68 y.o. male who is currently on an anti-coagulation regimen.    RECENT RESULTS: Recent results are below, the most recent result is correlated with a dose of 25 mg. per week: Lab Results  Component Value Date   INR 3.10 09/11/2013   INR 3.63* 08/24/2013   INR 3.39* 08/23/2013    ANTI-COAG DOSE: Anticoagulation Dose Instructions as of 09/11/2013     [name] Tue Wed Thu Fri Sat   New Dose 2.5 mg 2.5 mg 5 mg 2.5 mg 2.5 mg 5 mg 2.5 mg       ANTICOAG SUMMARY: Anticoagulation Episode Summary   Current INR goal 2.0-3.0  Next INR check 10/09/2013  INR from last check 3.10! (09/11/2013)  Weekly max dose   Target end date Indefinite  [facility] check [facility]  Preferred lab   Send INR reminders to    Indications  Atrial fibrillation [427.31] Long term (current) use of anticoagulants [V58.61]        Comments         ANTICOAG TODAY: Anticoagulation Summary as of 09/11/2013   INR goal 2.0-3.0  Selected INR 3.10! (09/11/2013)  Next INR check 10/09/2013  Target end date Indefinite   Indications  Atrial fibrillation [427.31] Long term (current) use of anticoagulants [V58.61]      Anticoagulation Episode Summary   [facility] check [facility]   Preferred lab    Send INR reminders to    Comments       PATIENT INSTRUCTIONS: Patient Instructions  Patient instructed to take medications as defined in the Anti-coagulation Track section of this encounter.  Patient instructed to take today's dose.  Patient verbalized understanding of these instructions.       FOLLOW-UP Return in 4 weeks (on 10/09/2013) for Follow up INR at 0900h.  [name], III Pharm.D., CACP

## 2013-09-12 ENCOUNTER — Other Ambulatory Visit: Payer: Self-pay | Admitting: Internal Medicine

## 2013-09-12 DIAGNOSIS — E079 Disorder of thyroid, unspecified: Secondary | ICD-10-CM
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
## 2013-09-17 ENCOUNTER — Emergency Department (HOSPITAL_COMMUNITY): Payer: Medicare Other

## 2013-09-17 ENCOUNTER — Encounter (HOSPITAL_COMMUNITY): Payer: Self-pay | Admitting: Emergency Medicine

## 2013-09-17 ENCOUNTER — Emergency Department (HOSPITAL_COMMUNITY)
Admission: EM | Admit: 2013-09-17 | Discharge: 2013-09-17 | Disposition: A | Discharge status: Home / Self Care | Payer: Medicare Other | Attending: Emergency Medicine | Admitting: Emergency Medicine

## 2013-09-17 DIAGNOSIS — M25569 Pain in unspecified knee: Secondary | ICD-10-CM | POA: Insufficient documentation

## 2013-09-17 DIAGNOSIS — S99929A Unspecified injury of unspecified foot, initial encounter: Secondary | ICD-10-CM

## 2013-09-17 DIAGNOSIS — S99919A Unspecified injury of unspecified ankle, initial encounter: Secondary | ICD-10-CM

## 2013-09-17 DIAGNOSIS — E669 Obesity, unspecified: Secondary | ICD-10-CM | POA: Diagnosis not present

## 2013-09-17 DIAGNOSIS — Y9301 Activity, walking, marching and hiking: Secondary | ICD-10-CM | POA: Insufficient documentation

## 2013-09-17 DIAGNOSIS — I4891 Unspecified atrial fibrillation: Secondary | ICD-10-CM | POA: Insufficient documentation

## 2013-09-17 DIAGNOSIS — Z8719 Personal history of other diseases of the digestive system: Secondary | ICD-10-CM | POA: Insufficient documentation

## 2013-09-17 DIAGNOSIS — S8990XA Unspecified injury of unspecified lower leg, initial encounter: Secondary | ICD-10-CM | POA: Diagnosis not present

## 2013-09-17 DIAGNOSIS — Y92410 Unspecified street and highway as the place of occurrence of the external cause: Secondary | ICD-10-CM | POA: Diagnosis not present

## 2013-09-17 DIAGNOSIS — F17200 Nicotine dependence, unspecified, uncomplicated: Secondary | ICD-10-CM | POA: Diagnosis not present

## 2013-09-17 DIAGNOSIS — I10 Essential (primary) hypertension: Secondary | ICD-10-CM | POA: Diagnosis not present

## 2013-09-17 DIAGNOSIS — S0990XA Unspecified injury of head, initial encounter: Secondary | ICD-10-CM | POA: Insufficient documentation

## 2013-09-17 DIAGNOSIS — S6990XA Unspecified injury of unspecified wrist, hand and finger(s), initial encounter: Secondary | ICD-10-CM | POA: Insufficient documentation

## 2013-09-17 DIAGNOSIS — M25579 Pain in unspecified ankle and joints of unspecified foot: Secondary | ICD-10-CM

## 2013-09-17 DIAGNOSIS — Z7901 Long term (current) use of anticoagulants: Secondary | ICD-10-CM | POA: Diagnosis not present

## 2013-09-17 DIAGNOSIS — S0010XA Contusion of unspecified eyelid and periocular area, initial encounter: Secondary | ICD-10-CM | POA: Insufficient documentation

## 2013-09-17 DIAGNOSIS — Z862 Personal history of diseases of the blood and blood-forming organs and certain disorders involving the immune mechanism: Secondary | ICD-10-CM | POA: Diagnosis not present

## 2013-09-17 DIAGNOSIS — W1809XA Striking against other object with subsequent fall, initial encounter: Secondary | ICD-10-CM | POA: Diagnosis not present

## 2013-09-17 DIAGNOSIS — Z79899 Other long term (current) drug therapy: Secondary | ICD-10-CM | POA: Insufficient documentation

## 2013-09-17 DIAGNOSIS — M79609 Pain in unspecified limb: Secondary | ICD-10-CM | POA: Insufficient documentation

## 2013-09-17 DIAGNOSIS — M79643 Pain in unspecified hand: Secondary | ICD-10-CM

## 2013-09-17 DIAGNOSIS — W19XXXA Unspecified fall, initial encounter: Secondary | ICD-10-CM

## 2013-09-17 MED ORDER — HYDROCODONE-ACETAMINOPHEN 5-325 MG PO TABS: 1.0000 | ORAL_TABLET | Freq: Four times a day (QID) | ORAL | Status: DC | PRN

## 2013-09-17 NOTE — ED Notes (Signed)
Bacitracin applied to facial wound. Ankle brace to l ankle, wrist immobilizer to r wrist

## 2013-09-17 NOTE — ED Notes (Signed)
Pt c/o fall two days ago where he slipped on the sidewalk. Swelling and pain to R side of face, R hand. Pain to L hand, R knee and L ankle. Dried abrasions noted to R face.

## 2013-09-17 NOTE — ED Notes (Signed)
Pt reports fell while walking on the street a couple of days ago. Has abrasion and swelling below right eye and pain to right hand with swelling.
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
## 2013-09-17 NOTE — Discharge Instructions (Signed)
Ankle Pain Ankle pain is a common symptom. The bones, cartilage, tendons, and muscles of the ankle joint perform a lot of work each day. The ankle joint holds your body weight and allows you to move around. Ankle pain can occur on either side or back of 1 or both ankles. Ankle pain may be sharp and burning or dull and aching. There may be tenderness, stiffness, redness, or warmth around the ankle. The pain occurs more often when a person walks or puts pressure on the ankle. CAUSES  There are many reasons ankle pain can develop. It is important to work with your caregiver to identify the cause since many conditions can impact the bones, cartilage, muscles, and tendons. Causes for ankle pain include:  Injury, including a break (fracture), sprain, or strain often due to a fall, sports, or a high-impact activity.  Swelling (inflammation) of a tendon (tendonitis).  Achilles tendon rupture.  Ankle instability after repeated sprains and strains.  Poor foot alignment.  Pressure on a nerve (tarsal tunnel syndrome).  Arthritis in the ankle or the lining of the ankle.  Crystal formation in the ankle (gout or pseudogout). DIAGNOSIS  A diagnosis is based on your medical history, your symptoms, results of your physical exam, and results of diagnostic tests. Diagnostic tests may include X-ray exams or a computerized magnetic scan (magnetic resonance imaging, MRI). TREATMENT  Treatment will depend on the cause of your ankle pain and may include:  Keeping pressure off the ankle and limiting activities.  Using crutches or other walking support (a cane or brace).  Using rest, ice, compression, and elevation.  Participating in physical therapy or home exercises.  Wearing shoe inserts or special shoes.  Losing weight.  Taking medications to reduce pain or swelling or receiving an injection.  Undergoing surgery. HOME CARE INSTRUCTIONS   Only take over-the-counter or prescription medicines for  pain, discomfort, or fever as directed by your caregiver.  Put ice on the injured area.  Put ice in a plastic bag.  Place a towel between your skin and the bag.  Leave the ice on for 15-20 minutes at a time, 03-04 times a day.  Keep your leg raised (elevated) when possible to lessen swelling.  Avoid activities that cause ankle pain.  Follow specific exercises as directed by your caregiver.  Record how often you have ankle pain, the location of the pain, and what it feels like. This information may be helpful to you and your caregiver.  Ask your caregiver about returning to work or sports and whether you should drive.  Follow up with your caregiver for further examination, therapy, or testing as directed. SEEK MEDICAL CARE IF:   Pain or swelling continues or worsens beyond 1 week.  You have an oral temperature above 102 F (38.9 C).  You are feeling unwell or have chills.  You are having an increasingly difficult time with walking.  You have loss of sensation or other new symptoms.  You have questions or concerns. MAKE SURE YOU:   Understand these instructions.  Will watch your condition.  Will get help right away if you are not doing well or get worse. Document Released: 12/24/2009 Document Revised: 09/28/2011 Document Reviewed: 12/24/2009 [facility] Patient Information 2014 [place]. Wrist Sprain with Rehab A sprain is an injury in which a ligament that maintains the proper alignment of a joint is partially or completely torn. The ligaments of the wrist are susceptible to sprains. Sprains are classified into three categories. Grade 1  sprains cause pain, but the tendon is not lengthened. Grade 2 sprains include a lengthened ligament because the ligament is stretched or partially ruptured. With grade 2 sprains there is still function, although the function may be diminished. Grade 3 sprains are characterized by a complete tear of the tendon or muscle, and function is  usually impaired. SYMPTOMS   Pain tenderness, inflammation, and/or bruising (contusion) of the injury.  A "pop" or tear felt and/or heard at the time of injury.  Decreased wrist function. CAUSES  A wrist sprain occurs when a force is placed on one or more ligaments that is greater than it/they can withstand. Common mechanisms of injury include:  Catching a ball with you hands.  Repetitive and/ or strenuous extension or flexion of the wrist. RISK INCREASES WITH:  Previous wrist injury.  Contact sports (boxing or wrestling).  Activities in which falling is common.  Poor strength and flexibility.  Improperly fitted or padded protective equipment. PREVENTION  Warm up and stretch properly before activity.  Allow for adequate recovery between workouts.  Maintain physical fitness:  Strength, flexibility, and endurance.  Cardiovascular fitness.  Protect the wrist joint by limiting its motion with the use of taping, braces, or splints.  Protect the wrist after injury for 6 to 12 months. PROGNOSIS  The prognosis for wrist sprains depends on the degree of injury. Grade 1 sprains require 2 to 6 weeks of treatment. Grade 2 sprains require 6 to 8 weeks of treatment, and grade 3 sprains require up to 12 weeks.  RELATED COMPLICATIONS   Prolonged healing time, if improperly treated or re-injured.  Recurrent symptoms that result in a chronic problem.  Injury to nearby structures (bone, cartilage, nerves, or tendons).  Arthritis of the wrist.  Inability to compete in athletics at a high level.  Wrist stiffness or weakness.  Progression to a complete rupture of the ligament. TREATMENT  Treatment initially involves resting from any activities that aggravate the symptoms, and the use of ice and medications to help reduce pain and inflammation. Your caregiver may recommend immobilizing the wrist for a period of time in order to reduce stress on the ligament and allow for healing.  After immobilization it is important to perform strengthening and stretching exercises to help regain strength and a full range of motion. These exercises may be completed at home or with a therapist. Surgery is not usually required for wrist sprains, unless the ligament has been ruptured (grade 3 sprain). MEDICATION   If pain medication is necessary, then nonsteroidal anti-inflammatory medications, such as aspirin and ibuprofen, or other minor pain relievers, such as acetaminophen, are often recommended.  Do not take pain medication for 7 days before surgery.  Prescription pain relievers may be given if deemed necessary by your caregiver. Use only as directed and only as much as you need. HEAT AND COLD  Cold treatment (icing) relieves pain and reduces inflammation. Cold treatment should be applied for 10 to 15 minutes every 2 to 3 hours for inflammation and pain and immediately after any activity that aggravates your symptoms. Use ice packs or massage the area with a piece of ice (ice massage).  Heat treatment may be used prior to performing the stretching and strengthening activities prescribed by your caregiver, physical therapist, or athletic trainer. Use a heat pack or soak your injury in warm water. SEEK MEDICAL CARE IF:  Treatment seems to offer no benefit, or the condition worsens.  Any medications produce adverse side effects. EXERCISES RANGE OF  MOTION (ROM) AND STRETCHING EXERCISES - Wrist Sprain  These exercises may help you when beginning to rehabilitate your injury. Your symptoms may resolve with or without further involvement from your physician, physical therapist or athletic trainer. While completing these exercises, remember:   Restoring tissue flexibility helps normal motion to return to the joints. This allows healthier, less painful movement and activity.  An effective stretch should be held for at least 30 seconds.  A stretch should never be painful. You should only  feel a gentle lengthening or release in the stretched tissue. RANGE OF MOTION  Wrist Flexion, Active-Assisted  Extend your right / left elbow with your fingers pointing down.*  Gently pull the back of your hand towards you until you feel a gentle stretch on the top of your forearm.  Hold this position for __________ seconds. Repeat __________ times. Complete this exercise __________ times per day.  *If directed by your physician, physical therapist or athletic trainer, complete this stretch with your elbow bent rather than extended. RANGE OF MOTION  Wrist Extension, Active-Assisted  Extend your right / left elbow and turn your palm upwards.*  Gently pull your palm/fingertips back so your wrist extends and your fingers point more toward the ground.  You should feel a gentle stretch on the inside of your forearm.  Hold this position for __________ seconds. Repeat __________ times. Complete this exercise __________ times per day. *If directed by your physician, physical therapist or athletic trainer, complete this stretch with your elbow bent, rather than extended. RANGE OF MOTION  Supination, Active  Stand or sit with your elbows at your side. Bend your right / left elbow to 90 degrees.  Turn your palm upward until you feel a gentle stretch on the inside of your forearm.  Hold this position for __________ seconds. Slowly release and return to the starting position. Repeat __________ times. Complete this stretch __________ times per day.  RANGE OF MOTION  Pronation, Active  Stand or sit with your elbows at your side. Bend your right / left elbow to 90 degrees.  Turn your palm downward until you feel a gentle stretch on the top of your forearm.  Hold this position for __________ seconds. Slowly release and return to the starting position. Repeat __________ times. Complete this stretch __________ times per day.  STRETCH - Wrist Flexion  Place the back of your right / left hand on a  tabletop leaving your elbow slightly bent. Your fingers should point away from your body.  Gently press the back of your hand down onto the table by straightening your elbow. You should feel a stretch on the top of your forearm.  Hold this position for __________ seconds. Repeat __________ times. Complete this stretch __________ times per day.  STRETCH  Wrist Extension  Place your right / left fingertips on a tabletop leaving your elbow slightly bent. Your fingers should point backwards.  Gently press your fingers and palm down onto the table by straightening your elbow. You should feel a stretch on the inside of your forearm.  Hold this position for __________ seconds. Repeat __________ times. Complete this stretch __________ times per day.  STRENGTHENING EXERCISES - Wrist Sprain These exercises may help you when beginning to rehabilitate your injury. They may resolve your symptoms with or without further involvement from your physician, physical therapist or athletic trainer. While completing these exercises, remember:   Muscles can gain both the endurance and the strength needed for everyday activities through controlled exercises.  Complete these exercises as instructed by your physician, physical therapist or athletic trainer. Progress with the resistance and repetition exercises only as your caregiver advises. STRENGTH Wrist Flexors  Sit with your right / left forearm palm-up and fully supported. Your elbow should be resting below the height of your shoulder. Allow your wrist to extend over the edge of the surface.  Loosely holding a __________ weight or a piece of rubber exercise band/tubing, slowly curl your hand up toward your forearm.  Hold this position for __________ seconds. Slowly lower the wrist back to the starting position in a controlled manner. Repeat __________ times. Complete this exercise __________ times per day.  STRENGTH  Wrist Extensors  Sit with your right /  left forearm palm-down and fully supported. Your elbow should be resting below the height of your shoulder. Allow your wrist to extend over the edge of the surface.  Loosely holding a __________ weight or a piece of rubber exercise band/tubing, slowly curl your hand up toward your forearm.  Hold this position for __________ seconds. Slowly lower the wrist back to the starting position in a controlled manner. Repeat __________ times. Complete this exercise __________ times per day.  STRENGTH - Ulnar Deviators  Stand with a ____________________ weight in your right / left hand, or sit holding on to the rubber exercise band/tubing with your opposite arm supported.  Move your wrist so that your pinkie travels toward your forearm and your thumb moves away from your forearm.  Hold this position for __________ seconds and then slowly lower the wrist back to the starting position. Repeat __________ times. Complete this exercise __________ times per day STRENGTH - Radial Deviators  Stand with a ____________________ weight in your  right / left hand, or sit holding on to the rubber exercise band/tubing with your arm supported.  Raise your hand upward in front of you or pull up on the rubber tubing.  Hold this position for __________ seconds and then slowly lower the wrist back to the starting position. Repeat __________ times. Complete this exercise __________ times per day. STRENGTH  Forearm Supinators  Sit with your right / left forearm supported on a table, keeping your elbow below shoulder height. Rest your hand over the edge, palm down.  Gently grip a hammer or a soup ladle.  Without moving your elbow, slowly turn your palm and hand upward to a "thumbs-up" position.  Hold this position for __________ seconds. Slowly return to the starting position. Repeat __________ times. Complete this exercise __________ times per day.  STRENGTH  Forearm Pronators  Sit with your right / left forearm  supported on a table, keeping your elbow below shoulder height. Rest your hand over the edge, palm up.  Gently grip a hammer or a soup ladle.  Without moving your elbow, slowly turn your palm and hand upward to a "thumbs-up" position.  Hold this position for __________ seconds. Slowly return to the starting position. Repeat __________ times. Complete this exercise __________ times per day.  STRENGTH - Grip  Grasp a tennis ball, a dense sponge, or a large, rolled sock in your hand.  Squeeze as hard as you can without increasing any pain.  Hold this position for __________ seconds. Release your grip slowly. Repeat __________ times. Complete this exercise __________ times per day.  Document Released: 07/06/2005 Document Revised: 09/28/2011 Document Reviewed: 10/18/2008 [facility] Patient Information 2014 [place], [place]. Fall Prevention and Home Safety Falls cause injuries and can affect all age groups. It is possible  to use preventive measures to significantly decrease the likelihood of falls. There are many simple measures which can make your home safer and prevent falls. OUTDOORS  Repair cracks and edges of walkways and driveways.  Remove high doorway thresholds.  Trim shrubbery on the main path into your home.  Have good outside lighting.  Clear walkways of tools, rocks, debris, and clutter.  Check that handrails are not broken and are securely fastened. Both sides of steps should have handrails.  Have leaves, snow, and ice cleared regularly.  Use sand or salt on walkways during winter months.  In the garage, clean up grease or oil spills. BATHROOM  Install night lights.  Install grab bars by the toilet and in the tub and shower.  Use non-skid mats or decals in the tub or shower.  Place a plastic non-slip stool in the shower to sit on, if needed.  Keep floors dry and clean up all water on the floor immediately.  Remove soap buildup in the tub or shower on a regular  basis.  Secure bath mats with non-slip, double-sided rug tape.  Remove throw rugs and tripping hazards from the floors. BEDROOMS  Install night lights.  Make sure a bedside light is easy to reach.  Do not use oversized bedding.  Keep a telephone by your bedside.  Have a firm chair with side arms to use for getting dressed.  Remove throw rugs and tripping hazards from the floor. KITCHEN  Keep handles on pots and pans turned toward the center of the stove. Use back burners when possible.  Clean up spills quickly and allow time for drying.  Avoid walking on wet floors.  Avoid hot utensils and knives.  Position shelves so they are not too high or low.  Place commonly used objects within easy reach.  If necessary, use a sturdy step stool with a grab bar when reaching.  Keep electrical cables out of the way.  Do not use floor polish or wax that makes floors slippery. If you must use wax, use non-skid floor wax.  Remove throw rugs and tripping hazards from the floor. STAIRWAYS  Never leave objects on stairs.  Place handrails on both sides of stairways and use them. Fix any loose handrails. Make sure handrails on both sides of the stairways are as long as the stairs.  Check carpeting to make sure it is firmly attached along stairs. Make repairs to worn or loose carpet promptly.  Avoid placing throw rugs at the top or bottom of stairways, or properly secure the rug with carpet tape to prevent slippage. Get rid of throw rugs, if possible.  Have an electrician put in a light switch at the top and bottom of the stairs. OTHER FALL PREVENTION TIPS  Wear low-heel or rubber-soled shoes that are supportive and fit well. Wear closed toe shoes.  When using a stepladder, make sure it is fully opened and both spreaders are firmly locked. Do not climb a closed stepladder.  Add color or contrast paint or tape to grab bars and handrails in your home. Place contrasting color strips on  first and last steps.  Learn and use mobility aids as needed. Install an electrical emergency response system.  Turn on lights to avoid dark areas. Replace light bulbs that burn out immediately. Get light switches that glow.  Arrange furniture to create clear pathways. Keep furniture in the same place.  Firmly attach carpet with non-skid or double-sided tape.  Eliminate uneven floor surfaces.  Select  a carpet pattern that does not visually hide the edge of steps.  Be aware of all pets. OTHER HOME SAFETY TIPS  Set the water temperature for 120 F (48.8 C).  Keep emergency numbers on or near the telephone.  Keep smoke detectors on every level of the home and near sleeping areas. Document Released: 06/26/2002 Document Revised: 01/05/2012 Document Reviewed: 09/25/2011 [facility] Patient Information 2014 [place].

## 2013-09-17 NOTE — ED Provider Notes (Signed)
Medical screening examination/treatment/procedure(s) were performed by non-physician practitioner and as supervising physician I was immediately available for consultation/collaboration.   EKG Interpretation None       Kourtnei Rauber R. Lerae Langham, MD 09/17/13 [id number] 

## 2013-09-18 ENCOUNTER — Encounter: Payer: Self-pay | Admitting: *Deleted

## 2013-09-19 ENCOUNTER — Encounter: Payer: Self-pay | Admitting: Internal Medicine

## 2013-09-20 ENCOUNTER — Ambulatory Visit (HOSPITAL_COMMUNITY): Payer: Medicare Other

## 2013-09-21 ENCOUNTER — Telehealth: Payer: Self-pay | Admitting: Hematology and Oncology

## 2013-09-21 NOTE — Telephone Encounter (Signed)
moved 3/10 lb/fu from PM to AM @ 8:30am. s/w pt he is aware.

## 2013-09-26 ENCOUNTER — Encounter: Payer: Self-pay | Admitting: Hematology and Oncology

## 2013-09-26 ENCOUNTER — Telehealth: Payer: Self-pay | Admitting: Hematology and Oncology

## 2013-09-26 ENCOUNTER — Ambulatory Visit (HOSPITAL_BASED_OUTPATIENT_CLINIC_OR_DEPARTMENT_OTHER): Payer: Medicare Other | Admitting: Hematology and Oncology

## 2013-09-26 ENCOUNTER — Other Ambulatory Visit (HOSPITAL_BASED_OUTPATIENT_CLINIC_OR_DEPARTMENT_OTHER): Payer: Medicare Other

## 2013-09-26 VITALS — BP 118/64 | HR 57 | Temp 97.6°F | Resp 18 | Ht 66.0 in | Wt 188.2 lb

## 2013-09-26 DIAGNOSIS — D751 Secondary polycythemia: Secondary | ICD-10-CM

## 2013-09-26 DIAGNOSIS — F17200 Nicotine dependence, unspecified, uncomplicated: Secondary | ICD-10-CM

## 2013-09-26 DIAGNOSIS — D45 Polycythemia vera: Secondary | ICD-10-CM

## 2013-09-26 LAB — CBC WITH DIFFERENTIAL/PLATELET
BASO%: 1.1 % (ref 0.0–2.0)
BASOS ABS: 0.1 10*3/uL (ref 0.0–0.1)
EOS ABS: 0.3 10*3/uL (ref 0.0–0.5)
EOS%: 4.3 % (ref 0.0–7.0)
HEMATOCRIT: 44.8 % (ref 38.4–49.9)
HGB: 15.1 g/dL (ref 13.0–17.1)
LYMPH%: 15 % (ref 14.0–49.0)
MCH: 34.5 pg — ABNORMAL HIGH (ref 27.2–33.4)
MCHC: 33.7 g/dL (ref 32.0–36.0)
MCV: 102.3 fL — ABNORMAL HIGH (ref 79.3–98.0)
MONO#: 0.4 10*3/uL (ref 0.1–0.9)
MONO%: 6.6 % (ref 0.0–14.0)
NEUT%: 73 % (ref 39.0–75.0)
NEUTROS ABS: 4.7 10*3/uL (ref 1.5–6.5)
PLATELETS: 178 10*3/uL (ref 140–400)
RBC: 4.38 10*6/uL (ref 4.20–5.82)
RDW: 18.3 % — ABNORMAL HIGH (ref 11.0–14.6)
WBC: 6.5 10*3/uL (ref 4.0–10.3)
lymph#: 1 10*3/uL (ref 0.9–3.3)

## 2013-09-26 NOTE — Progress Notes (Signed)
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 In November 2014, we removed one unit of blood and order an additional workup to rule out myeloproliferative disorder. Blood test result confirmed low erythropoietin level along with detectable JAK 2 mutation, confirmed myeloproliferative disorder Unfortunately, bone marrow biopsy on 06/26/2013 was nondiagnostic On 07/31/13 the patient was started on 1 hydroxyurea a day On 08/15/2013, increased hydroxyurea to 2 tablets a day and remove one more unit of blood due to the high hemoglobin On 09/05/2013, I increased hydroxyurea to 3 tablets a day and remove one more unit of blood due to high hemoglobin and hematocrit   INTERVAL HISTORY: [name] 68 y.o. male returns for further followup. The patient is compliant with hydroxyurea. He is attempting to quit smoking, down to 2 cigarettes a day. Denies any side effects from prior phlebotomy session. Denies any chest pain, dizziness or headaches. He remain on anticoagulation therapy.The patient denies any recent signs or symptoms of bleeding such as spontaneous epistaxis, hematuria or hematochezia.  I have reviewed the past medical history, past surgical history, social history and family history with the patient and they are unchanged from previous note.  ALLERGIES:  is allergic to codeine.  MEDICATIONS:  Current Outpatient Prescriptions  Medication Sig Dispense Refill  . albuterol (PROAIR HFA) 108 (90 BASE) MCG/ACT inhaler Inhale into the lungs daily as needed for wheezing or shortness of breath.      [name] amiodarone (PACERONE) 200 MG tablet Take 1 tablet  (200 mg total) by mouth daily.  30 tablet  5  . Budesonide (PULMICORT FLEXHALER) 90 MCG/ACT inhaler Inhale 1 puff into the lungs every evening.      . carvedilol (COREG) 12.5 MG tablet Take 1 tablet (12.5 mg total) by mouth 2 (two) times daily with a meal.  180 tablet  1  . furosemide (LASIX) 40 MG tablet Take 0.5 tablets (20 mg total) by mouth daily.  30 tablet  1  . hydroxyurea (HYDREA) 500 MG capsule Take 500 mg by mouth 3 (three) times daily. May take with food to minimize GI side effects.      [name] lisinopril (PRINIVIL,ZESTRIL) 5 MG tablet Take 1 tablet (5 mg total) by mouth daily.  30 tablet  5  . neomycin-bacitracin-polymyxin (NEOSPORIN) OINT Apply 1 application topically daily as needed for wound care.      . warfarin (COUMADIN) 5 MG tablet Take 2.5-5 mg by mouth daily. Take 1 tablet (5 mg) on Tuesday and Friday, take 1/2 tablet (2.5 mg) on Sunday, Monday, Wednesday, Thursday, Saturday       No current facility-administered medications for this visit.    REVIEW OF SYSTEMS:   Constitutional: Denies fevers, chills or abnormal weight loss Eyes: Denies blurriness of vision Ears, nose, mouth, throat, and face: Denies mucositis or sore throat Respiratory: Denies cough, dyspnea or wheezes Cardiovascular: Denies palpitation, chest discomfort or lower extremity swelling Gastrointestinal:  Denies nausea, heartburn or change in bowel habits Skin: Denies abnormal skin rashes Lymphatics: Denies new lymphadenopathy or easy bruising Neurological:Denies numbness, tingling or new weaknesses Behavioral/Psych: Mood is stable, no new changes  All other systems were reviewed with the patient and are negative.  PHYSICAL EXAMINATION: ECOG PERFORMANCE  STATUS: 0 - Asymptomatic  Filed Vitals:   09/26/13 0822  BP: 118/64  Pulse: 57  Temp: 97.6 F (36.4 C)  Resp: 18   Filed Weights   09/26/13 0822  Weight: 188 lb 3.2 oz (85.367 kg)    GENERAL:alert, no distress and comfortable SKIN: skin color,  texture, turgor are normal, no rashes or significant lesions EYES: normal, Conjunctiva are pink and non-injected, sclera clear OROPHARYNX:no exudate, no erythema and lips, buccal mucosa, and tongue normal  NECK: supple, thyroid normal size, non-tender, without nodularity LYMPH:  no palpable lymphadenopathy in the cervical, axillary or inguinal LUNGS: clear to auscultation and percussion with normal breathing effort HEART: regular rate & rhythm and no murmurs and no lower extremity edema ABDOMEN:abdomen soft, non-tender and normal bowel sounds Musculoskeletal:no cyanosis of digits and no clubbing  NEURO: alert & oriented x 3 with fluent speech, no focal motor/sensory deficits  LABORATORY DATA:  I have reviewed the data as listed    Component Value Date/Time   NA 139 08/23/2013 0310   NA 143 06/12/2013 1125   K 4.1 08/23/2013 0310   K 3.7 06/12/2013 1125   CL 99 08/23/2013 0310   CO2 26 08/23/2013 0310   CO2 32* 06/12/2013 1125   GLUCOSE 145* 08/23/2013 0310   GLUCOSE 92 06/12/2013 1125   BUN 17 08/23/2013 0310   BUN 14.6 06/12/2013 1125   CREATININE 0.85 08/23/2013 0310   CREATININE 0.9 06/12/2013 1125   CREATININE 0.94 03/24/2013 1409   CALCIUM 8.7 08/23/2013 0310   CALCIUM 8.9 06/12/2013 1125   PROT 6.9 06/12/2013 1125   PROT 7.8 03/20/2013 0815   ALBUMIN 3.3* 06/12/2013 1125   ALBUMIN 3.5 03/20/2013 0815   AST 21 06/12/2013 1125   AST 51* 03/20/2013 0815   ALT 28 06/12/2013 1125   ALT 32 03/20/2013 0815   ALKPHOS 112 06/12/2013 1125   ALKPHOS 126* 03/20/2013 0815   BILITOT 0.82 06/12/2013 1125   BILITOT 0.4 03/20/2013 0815   GFRNONAA 89* 08/23/2013 0310   GFRAA >90 08/23/2013 0310    No results found for this basename: SPEP,  UPEP,   kappa and lambda light chains    Lab Results  Component Value Date   WBC 6.5 09/26/2013   NEUTROABS 4.7 09/26/2013   HGB 15.1 09/26/2013   HCT 44.8 09/26/2013   MCV 102.3* 09/26/2013   PLT 178 09/26/2013      Chemistry      Component Value Date/Time   NA 139  08/23/2013 0310   NA 143 06/12/2013 1125   K 4.1 08/23/2013 0310   K 3.7 06/12/2013 1125   CL 99 08/23/2013 0310   CO2 26 08/23/2013 0310   CO2 32* 06/12/2013 1125   BUN 17 08/23/2013 0310   BUN 14.6 06/12/2013 1125   CREATININE 0.85 08/23/2013 0310   CREATININE 0.9 06/12/2013 1125   CREATININE 0.94 03/24/2013 1409      Component Value Date/Time   CALCIUM 8.7 08/23/2013 0310   CALCIUM 8.9 06/12/2013 1125   ALKPHOS 112 06/12/2013 1125   ALKPHOS 126* 03/20/2013 0815   AST 21 06/12/2013 1125   AST 51* 03/20/2013 0815   ALT 28 06/12/2013 1125   ALT 32 03/20/2013 0815   BILITOT 0.82 06/12/2013 1125   BILITOT 0.4 03/20/2013 0815     ASSESSMENT & PLAN:  #1 polycythemia vera His blood count and hematocrit finally came down to within acceptable limits. I recommend he continue on 3 hydroxyurea and day and I plan to  see him back in another month. If his blood count remained stable next month, I would then lengthen his visit to every 3 months. #2 tobacco abuse I spent time educating the patient and the importance of nicotine cessation and he is attempting to quit on his own.  Orders Placed This Encounter  Procedures  . CBC with Differential    Standing Status: Future     Number of Occurrences:      Standing Expiration Date: 09/26/2014   All questions were answered. The patient knows to call the clinic with any problems, questions or concerns. No barriers to learning was detected. I spent 15 minutes counseling the patient face to face. The total time spent in the appointment was 20 minutes and more than 50% was on counseling and review of test results     [facility], [place], MD 09/26/2013 8:45 AM

## 2013-09-26 NOTE — Telephone Encounter (Signed)
gv and printed appt sched and avs for pt for April  °

## 2013-09-29 ENCOUNTER — Ambulatory Visit (INDEPENDENT_AMBULATORY_CARE_PROVIDER_SITE_OTHER): Payer: Medicare Other | Admitting: *Deleted

## 2013-09-29 DIAGNOSIS — R55 Syncope and collapse: Secondary | ICD-10-CM

## 2013-10-09 ENCOUNTER — Ambulatory Visit (INDEPENDENT_AMBULATORY_CARE_PROVIDER_SITE_OTHER): Payer: Medicare Other | Admitting: Pharmacist

## 2013-10-09 DIAGNOSIS — I4891 Unspecified atrial fibrillation: Secondary | ICD-10-CM

## 2013-10-09 DIAGNOSIS — Z7901 Long term (current) use of anticoagulants: Secondary | ICD-10-CM

## 2013-10-09 LAB — POCT INR: INR: 2.1

## 2013-10-09 NOTE — Progress Notes (Signed)
Anti-Coagulation Progress Note  [name] is a 68 y.o. male who is currently on an anti-coagulation regimen.    RECENT RESULTS: Recent results are below, the most recent result is correlated with a dose of 22.5 mg. per week: Lab Results  Component Value Date   INR 2.10 10/09/2013   INR 3.10 09/11/2013   INR 3.63* 08/24/2013    ANTI-COAG DOSE: Anticoagulation Dose Instructions as of 10/09/2013     [name] Tue Wed Thu Fri Sat   New Dose 2.5 mg 2.5 mg 5 mg 2.5 mg 2.5 mg 5 mg 2.5 mg       ANTICOAG SUMMARY: Anticoagulation Episode Summary   Current INR goal 2.0-3.0  Next INR check 11/06/2013  INR from last check 2.10 (10/09/2013)  Weekly max dose   Target end date Indefinite  [facility] check [facility]  Preferred lab   Send INR reminders to    Indications  Atrial fibrillation [427.31] Long term (current) use of anticoagulants [V58.61]        Comments         ANTICOAG TODAY: Anticoagulation Summary as of 10/09/2013   INR goal 2.0-3.0  Selected INR 2.10 (10/09/2013)  Next INR check 11/06/2013  Target end date Indefinite   Indications  Atrial fibrillation [427.31] Long term (current) use of anticoagulants [V58.61]      Anticoagulation Episode Summary   [facility] check [facility]   Preferred lab    Send INR reminders to    Comments       PATIENT INSTRUCTIONS: Patient Instructions  Patient instructed to take medications as defined in the Anti-coagulation Track section of this encounter.  Patient instructed to take today's dose.  Patient verbalized understanding of these instructions.       FOLLOW-UP Return in 4 weeks (on 11/06/2013) for Follow up INR at 0915h.  [name], III Pharm.D., CACP

## 2013-10-09 NOTE — Progress Notes (Signed)
Indication: Atrial fibrillation.  Duration: Lifelong.  INR: At target.  Agree with Dr. Gladstone Pih assessment and plan.

## 2013-10-09 NOTE — Patient Instructions (Signed)
Patient instructed to take medications as defined in the Anti-coagulation Track section of this encounter.  Patient instructed to take today's dose.  Patient verbalized understanding of these instructions.    

## 2013-10-11 ENCOUNTER — Ambulatory Visit (INDEPENDENT_AMBULATORY_CARE_PROVIDER_SITE_OTHER): Payer: Medicare Other | Admitting: Internal Medicine

## 2013-10-11 ENCOUNTER — Encounter: Payer: Self-pay | Admitting: Internal Medicine

## 2013-10-11 VITALS — BP 121/67 | HR 54 | Ht 66.0 in | Wt 186.0 lb

## 2013-10-11 DIAGNOSIS — R55 Syncope and collapse: Secondary | ICD-10-CM

## 2013-10-11 DIAGNOSIS — I4891 Unspecified atrial fibrillation: Secondary | ICD-10-CM

## 2013-10-11 LAB — MDC_IDC_ENUM_SESS_TYPE_INCLINIC
Date Time Interrogation Session: 20150325150527
MDC IDC SET ZONE DETECTION INTERVAL: 2000 ms
Zone Setting Detection Interval: 3000 ms
Zone Setting Detection Interval: 370 ms

## 2013-10-11 NOTE — Patient Instructions (Signed)
Your physician wants you to follow-up in: 12 months with Dr. Taylor. You will receive a reminder letter in the mail two months in advance. If you don't receive a letter, please call our office to schedule the follow-up appointment.    

## 2013-10-11 NOTE — Progress Notes (Signed)
HPI [name] is referred today by the [facility] for evaluation of atrial fibrillation and LV dysfunction. He has a h/o syncope and is s/p ILR.  He is a patient who has seen Dr. Wynonia Lawman in the past. He has had a h/o non-compliance and a 2D echo in February 2014 demonstrated an EF 40%. He has not had significant CHF sysmptoms since then and his EF appears to have improved. He notes that his compliance has improved. He denies syncope. No chest pain. No edema. No syncope. Allergies  Allergen Reactions  . Codeine Rash     Current Outpatient Prescriptions  Medication Sig Dispense Refill  . albuterol (PROAIR HFA) 108 (90 BASE) MCG/ACT inhaler Inhale into the lungs daily as needed for wheezing or shortness of breath.      [name] amiodarone (PACERONE) 200 MG tablet Take 1 tablet (200 mg total) by mouth daily.  30 tablet  5  . Budesonide (PULMICORT FLEXHALER) 90 MCG/ACT inhaler Inhale 1 puff into the lungs every evening.      . carvedilol (COREG) 12.5 MG tablet Take 1 tablet (12.5 mg total) by mouth 2 (two) times daily with a meal.  180 tablet  1  . furosemide (LASIX) 40 MG tablet Take 0.5 tablets (20 mg total) by mouth daily.  30 tablet  1  . hydroxyurea (HYDREA) 500 MG capsule Take 500 mg by mouth 3 (three) times daily. May take with food to minimize GI side effects.      [name] lisinopril (PRINIVIL,ZESTRIL) 5 MG tablet Take 1 tablet (5 mg total) by mouth daily.  30 tablet  5  . neomycin-bacitracin-polymyxin (NEOSPORIN) OINT Apply 1 application topically daily as needed for wound care.      . warfarin (COUMADIN) 5 MG tablet Take 2.5-5 mg by mouth daily. Take 1 tablet (5 mg) on Tuesday and Friday, take 1/2 tablet (2.5 mg) on Sunday, Monday, Wednesday, Thursday, Saturday       No current facility-administered medications for this visit.     Past Medical History  Diagnosis Date  . Cardiomyopathy     EF55% 11/14<<35%   . Substance abuse     alcohol  . Hypertension   . Atrial  fibrillation /flutter     hx of flutter ablation 2/12  . Shortness of breath     "all the time lately" (12/14/2012)  . GERD (gastroesophageal reflux disease)   . Hepatitis     "think I had that once; a long time ago; from dirty needles I think" (12/14/2012)  . 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 Leukocytosis, unspecified 06/12/2013  . 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 Obesity   . Syncope   . Syncope     ROS:   All systems reviewed and negative except as noted in the HPI.   Past Surgical History  Procedure Laterality Date  . Hemorrhoid surgery  1970's  . Cardiac electrophysiology mapping and ablation  08/2010    [name] 09/07/2010 (12/14/2012)  . Loop recorder implant  08-22-2013    MDT LinQ implanted by Dr Caryl Comes for syncope     Family History  Problem Relation Age of Onset  . Diabetes Mother   . Hypertension Mother   . Cirrhosis Father   . Alcohol abuse Father      History   Social History  . Marital Status: Single    Spouse Name: N/A    Number of Children: N/A  . Years of Education: N/A   Occupational History  . Not on  file.   Social History Main Topics  . Smoking status: Current Every Day Smoker -- 0.20 packs/day for 40 years    Types: Cigarettes  . Smokeless tobacco: Never Used     Comment: patient smoked 1-2ppd x 50 years, now smoking approx 4-5 cigs per day x 1 year  . Alcohol Use: 0.0 oz/week     Comment: drinks a beer once in a while for the past year, but has h/o heavy alcohol use for many years before this  . Drug Use: Yes    Special: "Crack" cocaine, Cocaine, Heroin     Comment: 03/20/2013 "been clean for awhile"; endorses using heroin "all my life" up until about a year ago  . Sexual Activity: Not Currently   Other Topics Concern  . Not on file   Social History Narrative   Moved here from [place]. Lives with common law wife and grandchildren. He is retired from "general labor."     BP 121/67  Pulse 54  Ht 5\' 6"  (1.676 m)  Wt 186 lb  (84.369 kg)  BMI 30.04 kg/m2  Physical Exam:  diskempt appearing 68 yo man, NAD HEENT: Unremarkable Neck:  6 cm JVD, no thyromegally Lungs:  Clear with no wheezes, rales, or rhonchi HEART:  Regular rate rhythm, no murmurs, no rubs, no clicks Abd:  soft, positive bowel sounds, no organomegally, no rebound, no guarding Ext:  2 plus pulses, no edema, no cyanosis, no clubbing Skin:  No rashes no nodules Neuro:  CN II through XII intact, motor grossly intact   Assess/Plan:

## 2013-10-13 ENCOUNTER — Encounter: Payer: Self-pay | Admitting: Internal Medicine

## 2013-10-13 NOTE — Assessment & Plan Note (Signed)
Interogation of his ILR demonstrates no sustained pauses. No obvious cause of his unexplained syncope.

## 2013-10-13 NOTE — Assessment & Plan Note (Signed)
He has brief episodes of atrial fib lasting 2-3 hours at a time. No change in medical therapy. He will continue his anti-coagulation.

## 2013-10-19 ENCOUNTER — Encounter: Payer: Self-pay | Admitting: Internal Medicine

## 2013-10-19 ENCOUNTER — Ambulatory Visit (INDEPENDENT_AMBULATORY_CARE_PROVIDER_SITE_OTHER): Payer: Medicare Other | Admitting: Internal Medicine

## 2013-10-19 VITALS — BP 117/74 | HR 59 | Temp 97.1°F | Ht 66.0 in | Wt 186.2 lb

## 2013-10-19 DIAGNOSIS — F17200 Nicotine dependence, unspecified, uncomplicated: Secondary | ICD-10-CM | POA: Diagnosis not present

## 2013-10-19 DIAGNOSIS — I10 Essential (primary) hypertension: Secondary | ICD-10-CM | POA: Diagnosis not present

## 2013-10-19 DIAGNOSIS — Z9181 History of falling: Secondary | ICD-10-CM | POA: Diagnosis not present

## 2013-10-19 DIAGNOSIS — D45 Polycythemia vera: Secondary | ICD-10-CM | POA: Diagnosis present

## 2013-10-19 DIAGNOSIS — I4891 Unspecified atrial fibrillation: Secondary | ICD-10-CM

## 2013-10-19 DIAGNOSIS — W19XXXA Unspecified fall, initial encounter: Secondary | ICD-10-CM

## 2013-10-19 DIAGNOSIS — Z720 Tobacco use: Secondary | ICD-10-CM

## 2013-10-19 NOTE — Patient Instructions (Addendum)
General Instructions: Follow up in 3-6 months, sooner if needed  Take care.    Treatment Goals:  Goals (1 Years of Data) as of 10/19/13         As of Today 10/11/13 09/26/13 09/17/13 09/17/13     Blood Pressure    . Blood Pressure < 140/90  117/74 121/67 118/64 135/77 135/86      Progress Toward Treatment Goals:  Treatment Goal 10/19/2013  Blood pressure at goal  Stop smoking smoking the same amount    Self Care Goals & Plans:  Self Care Goal 10/19/2013  Manage my medications take my medicines as prescribed; bring my medications to every visit; refill my medications on time  Monitor my health keep track of my blood pressure  Eat healthy foods drink diet soda or water instead of juice or soda; eat more vegetables; eat foods that are low in salt; eat baked foods instead of fried foods  Be physically active find an activity I enjoy  Stop smoking call [facility] (1-800-QUIT-NOW)  Meeting treatment goals maintain the current self-care plan    No flowsheet data found.   Care Management & Community Referrals:  Referral 10/19/2013  Referrals made for care management support none needed  Referrals made to community resources none       Atrial Fibrillation Atrial fibrillation is a type of irregular heart rhythm (arrhythmia). During atrial fibrillation, the upper chambers of the heart (atria) quiver continuously in a chaotic pattern. This causes an irregular and often rapid heart rate.  Atrial fibrillation is the result of the heart becoming overloaded with disorganized signals that tell it to beat. These signals are normally released one at a time by a part of the right atrium called the sinoatrial node. They then travel from the atria to the lower chambers of the heart (ventricles), causing the atria and ventricles to contract and pump blood as they pass. In atrial fibrillation, parts of the atria outside of the sinoatrial node also release these signals. This results in two problems. First,  the atria receive so many signals that they do not have time to fully contract. Second, the ventricles, which can only receive one signal at a time, beat irregularly and out of rhythm with the atria.  There are three types of atrial fibrillation:   Paroxysmal Paroxysmal atrial fibrillation starts suddenly and stops on its own within a week.   Persistent Persistent atrial fibrillation lasts for more than a week. It may stop on its own or with treatment.   Permanent Permanent atrial fibrillation does not go away. Episodes of atrial fibrillation may lead to permanent atrial fibrillation.  Atrial fibrillation can prevent your heart from pumping blood normally. It increases your risk of stroke and can lead to heart failure.  CAUSES   Heart conditions, including a heart attack, heart failure, coronary artery disease, and heart valve conditions.   Inflammation of the sac that surrounds the heart (pericarditis).   Blockage of an artery in the lungs (pulmonary embolism).   Pneumonia or other infections.   Chronic lung disease.   Thyroid problems, especially if the thyroid is overactive (hyperthyroidism).   Caffeine, excessive alcohol use, and use of some illegal drugs.   Use of some medications, including certain decongestants and diet pills.   Heart surgery.   Birth defects.  Sometimes, no cause can be found. When this happens, the atrial fibrillation is called lone atrial fibrillation. The risk of complications from atrial fibrillation increases if you have lone atrial  fibrillation and you are age 67 years or older. RISK FACTORS  Heart failure.  Coronary artery disease  Diabetes mellitus.   High blood pressure (hypertension).   Obesity.   Other arrhythmias.   Increased age. SYMPTOMS   A feeling that your heart is beating rapidly or irregularly.   A feeling of discomfort or pain in your chest.   Shortness of breath.   Sudden lightheadedness or  weakness.   Getting tired easily when exercising.   Urinating more often than normal (mainly when atrial fibrillation first begins).  In paroxysmal atrial fibrillation, symptoms may start and suddenly stop. DIAGNOSIS  Your caregiver may be able to detect atrial fibrillation when taking your pulse. Usually, testing is needed to diagnosis atrial fibrillation. Tests may include:   Electrocardiography. During this test, the electrical impulses of your heart are recorded while you are lying down.   Echocardiography. During echocardiography, sound waves are used to evaluate how blood flows through your heart.   Stress test. There is more than one type of stress test. If a stress test is needed, ask your caregiver about which type is best for you.   Chest X-ray exam.   Blood tests.   Computed tomography (CT).  TREATMENT   Treating any underlying conditions. For example, if you have an overactive thyroid, treating the condition may correct atrial fibrillation.   Medication. Medications may be given to control a rapid heart rate or to prevent blood clots, heart failure, or a stroke.   Procedure to correct the rhythm of the heart:  Electrical cardioversion. During electrical cardioversion, a controlled, low-energy shock is delivered to the heart through your skin. If you have chest pain, very low pressure blood pressure, or sudden heart failure, this procedure may need to be done as an emergency.  Catheter ablation. During this procedure, heart tissues that send the signals that cause atrial fibrillation are destroyed.  Maze or minimaze procedure. During this surgery, thin lines of heart tissue that carry the abnormal signals are destroyed. The maze procedure is an open-heart surgery. The minimaze procedure is a minimally invasive surgery. This means that small cuts are made to access the heart instead of a large opening.  Pulmonary venous isolation. During this surgery, tissue  around the veins that carry blood from the lungs (pulmonary veins) is destroyed. This tissue is thought to carry the abnormal signals. HOME CARE INSTRUCTIONS   Take medications as directed by your caregiver.  Only take medications that your caregiver approves. Some medications can make atrial fibrillation worse or recur.  If blood thinners were prescribed by your caregiver, take them exactly as directed. Too much can cause bleeding. Too little and you will not have the needed protection against stroke and other problems.  Perform blood tests at home if directed by your caregiver.  Perform blood tests exactly as directed.   Quit smoking if you smoke.   Do not drink alcohol.   Do not drink caffeinated beverages such as coffee, soda, and some teas. You may drink decaffeinated coffee, soda, or tea.   Maintain a healthy weight. Do not use diet pills unless your caregiver approves. They may make heart problems worse.   Follow diet instructions as directed by your caregiver.   Exercise regularly as directed by your caregiver.   Keep all follow-up appointments. PREVENTION  The following substances can cause atrial fibrillation to recur:   Caffeinated beverages.   Alcohol.   Certain medications, especially those used for breathing problems.  Certain herbs and herbal medications, such as those containing ephedra or ginseng.  Illegal drugs such as cocaine and amphetamines. Sometimes medications are given to prevent atrial fibrillation from recurring. Proper treatment of any underlying condition is also important in helping prevent recurrence.  SEEK MEDICAL CARE IF:  You notice a change in the rate, rhythm, or strength of your heartbeat.   You suddenly begin urinating more frequently.   You tire more easily when exerting yourself or exercising.  SEEK IMMEDIATE MEDICAL CARE IF:   You develop chest pain, abdominal pain, sweating, or weakness.  You feel sick to your  stomach (nauseous).  You develop shortness of breath.  You suddenly develop swollen feet and ankles.  You feel dizzy.  You face or limbs feel numb or weak.  There is a change in your vision or speech. MAKE SURE YOU:   Understand these instructions.  Will watch your condition.  Will get help right away if you are not doing well or get worse. Document Released: 07/06/2005 Document Revised: 10/31/2012 Document Reviewed: 08/16/2012 [facility] Patient Information 2014 [place].  Ankle Pain Ankle pain is a common symptom. The bones, cartilage, tendons, and muscles of the ankle joint perform a lot of work each day. The ankle joint holds your body weight and allows you to move around. Ankle pain can occur on either side or back of 1 or both ankles. Ankle pain may be sharp and burning or dull and aching. There may be tenderness, stiffness, redness, or warmth around the ankle. The pain occurs more often when a person walks or puts pressure on the ankle. CAUSES  There are many reasons ankle pain can develop. It is important to work with your caregiver to identify the cause since many conditions can impact the bones, cartilage, muscles, and tendons. Causes for ankle pain include:  Injury, including a break (fracture), sprain, or strain often due to a fall, sports, or a high-impact activity.  Swelling (inflammation) of a tendon (tendonitis).  Achilles tendon rupture.  Ankle instability after repeated sprains and strains.  Poor foot alignment.  Pressure on a nerve (tarsal tunnel syndrome).  Arthritis in the ankle or the lining of the ankle.  Crystal formation in the ankle (gout or pseudogout). DIAGNOSIS  A diagnosis is based on your medical history, your symptoms, results of your physical exam, and results of diagnostic tests. Diagnostic tests may include X-ray exams or a computerized magnetic scan (magnetic resonance imaging, MRI). TREATMENT  Treatment will depend on the cause of  your ankle pain and may include:  Keeping pressure off the ankle and limiting activities.  Using crutches or other walking support (a cane or brace).  Using rest, ice, compression, and elevation.  Participating in physical therapy or home exercises.  Wearing shoe inserts or special shoes.  Losing weight.  Taking medications to reduce pain or swelling or receiving an injection.  Undergoing surgery. HOME CARE INSTRUCTIONS   Only take over-the-counter or prescription medicines for pain, discomfort, or fever as directed by your caregiver.  Put ice on the injured area.  Put ice in a plastic bag.  Place a towel between your skin and the bag.  Leave the ice on for 15-20 minutes at a time, 03-04 times a day.  Keep your leg raised (elevated) when possible to lessen swelling.  Avoid activities that cause ankle pain.  Follow specific exercises as directed by your caregiver.  Record how often you have ankle pain, the location of the pain, and what  it feels like. This information may be helpful to you and your caregiver.  Ask your caregiver about returning to work or sports and whether you should drive.  Follow up with your caregiver for further examination, therapy, or testing as directed. SEEK MEDICAL CARE IF:   Pain or swelling continues or worsens beyond 1 week.  You have an oral temperature above 102 F (38.9 C).  You are feeling unwell or have chills.  You are having an increasingly difficult time with walking.  You have loss of sensation or other new symptoms.  You have questions or concerns. MAKE SURE YOU:   Understand these instructions.  Will watch your condition.  Will get help right away if you are not doing well or get worse. Document Released: 12/24/2009 Document Revised: 09/28/2011 Document Reviewed: 12/24/2009 [facility] Patient Information 2014 [place].  Fall Prevention and Home Safety Falls cause injuries and can affect all age groups. It is  possible to prevent falls.  HOW TO PREVENT FALLS  Wear shoes with rubber soles that do not have an opening for your toes.  Keep the inside and outside of your house well lit.  Use night lights throughout your home.  Remove clutter from floors.  Clean up floor spills.  Remove throw rugs or fasten them to the floor with carpet tape.  Do not place electrical cords across pathways.  Put grab bars by your tub, shower, and toilet. Do not use towel bars as grab bars.  Put handrails on both sides of the stairway. Fix loose handrails.  Do not climb on stools or stepladders, if possible.  Do not wax your floors.  Repair uneven or unsafe sidewalks, walkways, or stairs.  Keep items you use a lot within reach.  Be aware of pets.  Keep emergency numbers next to the telephone.  Put smoke detectors in your home and near bedrooms. Ask your doctor what other things you can do to prevent falls. Document Released: 05/02/2009 Document Revised: 01/05/2012 Document Reviewed: 10/06/2011 [facility] Patient Information 2014 [place], [place]. Hypertension As your heart beats, it forces blood through your arteries. This force is your blood pressure. If the pressure is too high, it is called hypertension (HTN) or high blood pressure. HTN is dangerous because you may have it and not know it. High blood pressure may mean that your heart has to work harder to pump blood. Your arteries may be narrow or stiff. The extra work puts you at risk for heart disease, stroke, and other problems.  Blood pressure consists of two numbers, a higher number over a lower, 110/72, for example. It is stated as "110 over 72." The ideal is below 120 for the top number (systolic) and under 80 for the bottom (diastolic). Write down your blood pressure today. You should pay close attention to your blood pressure if you have certain conditions such as:  Heart failure.  Prior heart attack.  Diabetes  Chronic kidney  disease.  Prior stroke.  Multiple risk factors for heart disease. To see if you have HTN, your blood pressure should be measured while you are seated with your arm held at the level of the heart. It should be measured at least twice. A one-time elevated blood pressure reading (especially in the Emergency Department) does not mean that you need treatment. There may be conditions in which the blood pressure is different between your right and left arms. It is important to see your caregiver soon for a recheck. Most people have essential hypertension  which means that there is not a specific cause. This type of high blood pressure may be lowered by changing lifestyle factors such as:  Stress.  Smoking.  Lack of exercise.  Excessive weight.  Drug/tobacco/alcohol use.  Eating less salt. Most people do not have symptoms from high blood pressure until it has caused damage to the body. Effective treatment can often prevent, delay or reduce that damage. TREATMENT  When a cause has been identified, treatment for high blood pressure is directed at the cause. There are a large number of medications to treat HTN. These fall into several categories, and your caregiver will help you select the medicines that are best for you. Medications may have side effects. You should review side effects with your caregiver. If your blood pressure stays high after you have made lifestyle changes or started on medicines,   Your medication(s) may need to be changed.  Other problems may need to be addressed.  Be certain you understand your prescriptions, and know how and when to take your medicine.  Be sure to follow up with your caregiver within the time frame advised (usually within two weeks) to have your blood pressure rechecked and to review your medications.  If you are taking more than one medicine to lower your blood pressure, make sure you know how and at what times they should be taken. Taking two medicines  at the same time can result in blood pressure that is too low. SEEK IMMEDIATE MEDICAL CARE IF:  You develop a severe headache, blurred or changing vision, or confusion.  You have unusual weakness or numbness, or a faint feeling.  You have severe chest or abdominal pain, vomiting, or breathing problems. MAKE SURE YOU:   Understand these instructions.  Will watch your condition.  Will get help right away if you are not doing well or get worse. Document Released: 07/06/2005 Document Revised: 09/28/2011 Document Reviewed: 02/24/2008 [facility] Patient Information 2014 [place].  Smoking Cessation Quitting smoking is important to your health and has many advantages. However, it is not always easy to quit since nicotine is a very addictive drug. Often times, people try 3 times or more before being able to quit. This document explains the best ways for you to prepare to quit smoking. Quitting takes hard work and a lot of effort, but you can do it. ADVANTAGES OF QUITTING SMOKING  You will live longer, feel better, and live better.  Your body will feel the impact of quitting smoking almost immediately.  Within 20 minutes, blood pressure decreases. Your pulse returns to its normal level.  After 8 hours, carbon monoxide levels in the blood return to normal. Your oxygen level increases.  After 24 hours, the chance of having a heart attack starts to decrease. Your breath, hair, and body stop smelling like smoke.  After 48 hours, damaged nerve endings begin to recover. Your sense of taste and smell improve.  After 72 hours, the body is virtually free of nicotine. Your bronchial tubes relax and breathing becomes easier.  After 2 to 12 weeks, lungs can hold more air. Exercise becomes easier and circulation improves.  The risk of having a heart attack, stroke, cancer, or lung disease is greatly reduced.  After 1 year, the risk of coronary heart disease is cut in half.  After 5 years,  the risk of stroke falls to the same as a nonsmoker.  After 10 years, the risk of lung cancer is cut in half and the risk of  other cancers decreases significantly.  After 15 years, the risk of coronary heart disease drops, usually to the level of a nonsmoker.  If you are pregnant, quitting smoking will improve your chances of having a healthy baby.  The people you live with, especially any children, will be healthier.  You will have extra money to spend on things other than cigarettes. QUESTIONS TO THINK ABOUT BEFORE ATTEMPTING TO QUIT You may want to talk about your answers with your caregiver.  Why do you want to quit?  If you tried to quit in the past, what helped and what did not?  What will be the most difficult situations for you after you quit? How will you plan to handle them?  Who can help you through the tough times? Your family? Friends? A caregiver?  What pleasures do you get from smoking? What ways can you still get pleasure if you quit? Here are some questions to ask your caregiver:  How can you help me to be successful at quitting?  What medicine do you think would be best for me and how should I take it?  What should I do if I need more help?  What is smoking withdrawal like? How can I get information on withdrawal? GET READY  Set a quit date.  Change your environment by getting rid of all cigarettes, ashtrays, matches, and lighters in your home, car, or work. Do not let people smoke in your home.  Review your past attempts to quit. Think about what worked and what did not. GET SUPPORT AND ENCOURAGEMENT You have a better chance of being successful if you have help. You can get support in many ways.  Tell your family, friends, and co-workers that you are going to quit and need their support. Ask them not to smoke around you.  Get individual, group, or telephone counseling and support. Programs are available at [employer] and health centers. Call your  local health department for information about programs in your area.  Spiritual beliefs and practices may help some smokers quit.  Download a "quit meter" on your computer to keep track of quit statistics, such as how long you have gone without smoking, cigarettes not smoked, and money saved.  Get a self-help book about quitting smoking and staying off of tobacco. [place] yourself from urges to smoke. Talk to someone, go for a walk, or occupy your time with a task.  Change your normal routine. Take a different route to work. Drink tea instead of coffee. Eat breakfast in a different place.  Reduce your stress. Take a hot bath, exercise, or read a book.  Plan something enjoyable to do every day. Reward yourself for not smoking.  Explore interactive web-based programs that specialize in helping you quit. GET MEDICINE AND USE IT CORRECTLY Medicines can help you stop smoking and decrease the urge to smoke. Combining medicine with the above behavioral methods and support can greatly increase your chances of successfully quitting smoking.  Nicotine replacement therapy helps deliver nicotine to your body without the negative effects and risks of smoking. Nicotine replacement therapy includes nicotine gum, lozenges, inhalers, nasal sprays, and skin patches. Some may be available over-the-counter and others require a prescription.  Antidepressant medicine helps people abstain from smoking, but how this works is unknown. This medicine is available by prescription.  Nicotinic receptor partial agonist medicine simulates the effect of nicotine in your brain. This medicine is available by prescription. Ask your caregiver  for advice about which medicines to use and how to use them based on your health history. Your caregiver will tell you what side effects to look out for if you choose to be on a medicine or therapy. Carefully read the information on the package. Do not use  any other product containing nicotine while using a nicotine replacement product.  RELAPSE OR DIFFICULT SITUATIONS Most relapses occur within the first 3 months after quitting. Do not be discouraged if you start smoking again. Remember, most people try several times before finally quitting. You may have symptoms of withdrawal because your body is used to nicotine. You may crave cigarettes, be irritable, feel very hungry, cough often, get headaches, or have difficulty concentrating. The withdrawal symptoms are only temporary. They are strongest when you first quit, but they will go away within 10 14 days. To reduce the chances of relapse, try to:  Avoid drinking alcohol. Drinking lowers your chances of successfully quitting.  Reduce the amount of caffeine you consume. Once you quit smoking, the amount of caffeine in your body increases and can give you symptoms, such as a rapid heartbeat, sweating, and anxiety.  Avoid smokers because they can make you want to smoke.  Do not let weight gain distract you. Many smokers will gain weight when they quit, usually less than 10 pounds. Eat a healthy diet and stay active. You can always lose the weight gained after you quit.  Find ways to improve your mood other than smoking. FOR MORE INFORMATION  www.smokefree.gov  Document Released: 06/30/2001 Document Revised: 01/05/2012 Document Reviewed: 10/15/2011 [facility] Patient Information 2014 [place], [place].

## 2013-10-19 NOTE — Assessment & Plan Note (Signed)
Fall on ice  Given info on falls  Left ankle feeling better

## 2013-10-19 NOTE — Assessment & Plan Note (Signed)
Follows with cardiology HR 59 today no symptoms  Continue Coumadin  F/u with Dr. Elie Confer

## 2013-10-19 NOTE — Progress Notes (Signed)
   Subjective:    Patient ID: [name], male    DOB: [birth year], 68 y.o.   MRN: [id number]  HPI Comments: 68 y.o Past Medical History Cardiomyopathy (likely tachy induced from AF last echo EF 76-73%ALPFX 1 diastolic dysfunction, Substance abuse (alcohol, heroin (quit heroin a couple of weeks ago), Hypertension (BP controlled 117/74 today), Atrial fibrillation /flutter (with hx of flutter ablation 2/12 on coumadin INR 3.10 10/09/13), chronic shortness of breath with exertion,  GERD (gastroesophageal reflux disease), Hepatitis, Polycythemia (secondary), Myeloproliferative neoplasm, Obesity, syncope, history of bradycardia.                                                     He presents for follow up: 1) He had a fall on ice and had left ankle pain which is now resolved with no mobility limitations  2) He denies recent h/o syncope and had f/u with cardiology.  He still has loop recorder intact which shows some pauses.    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  Reports stopped heroin 2 weeks ago      Review of Systems  Respiratory: Positive for shortness of breath.        Sob with exertion   Cardiovascular: Negative for chest pain.  Gastrointestinal: Negative for constipation.  Neurological: Negative for dizziness, syncope and light-headedness.       Objective:   Physical Exam  Nursing note and vitals reviewed. Constitutional: He is oriented to person, place, and time. Vital signs are normal. He appears well-developed and well-nourished. He is cooperative.  HENT:  Head: Normocephalic and atraumatic.  Mouth/Throat: Oropharynx is clear and moist and mucous membranes are normal. Abnormal dentition. No oropharyngeal exudate.  Eyes: Conjunctivae are normal. Pupils are equal, round, and reactive to light. Right eye exhibits no discharge. Left eye exhibits no discharge. No scleral icterus.  Cardiovascular: An irregularly irregular rhythm present. Bradycardia present.   No murmur heard. Trace lower  ext edema   Pulmonary/Chest: Effort normal and breath sounds normal.  Abdominal: Soft. Bowel sounds are normal. He exhibits no distension. There is no tenderness.  Musculoskeletal: He exhibits no edema.  No swelling or edema left ankle full ROM  Neurological: He is alert and oriented to person, place, and time. Gait normal.  Skin: Skin is warm, dry and intact. No rash noted.  Psychiatric: He has a normal mood and affect. His speech is normal and behavior is normal. Judgment and thought content normal. Cognition and memory are normal.          Assessment & Plan:  F/u in 3-6 months

## 2013-10-19 NOTE — Assessment & Plan Note (Signed)
  Assessment: Progress toward smoking cessation:  smoking the same amount (4 cig per day trying to cut down ) Barriers to progress toward smoking cessation:  none Comments: none   Plan: Instruction/counseling given:  I counseled patient on the dangers of tobacco use, advised patient to stop smoking, and reviewed strategies to maximize success. Educational resources provided:  [employer] account manager) brochure Self management tools provided:  other (see comments) Medications to assist with smoking cessation:  None Patient agreed to the following self-care plans for smoking cessation: call [employer] (1-800-QUIT-NOW)  Other plans: keep encouraging cessation

## 2013-10-19 NOTE — Assessment & Plan Note (Signed)
BP Readings from Last 3 Encounters:  10/19/13 117/74  10/11/13 121/67  09/26/13 118/64    Lab Results  Component Value Date   NA 139 08/23/2013   K 4.1 08/23/2013   CREATININE 0.85 08/23/2013    Assessment: Blood pressure control: controlled Progress toward BP goal:  at goal Comments: none  Plan: Medications:  continue current medications Educational resources provided: other (see comments) Self management tools provided: other (see comments) Other plans: f/u in 3-6 months

## 2013-10-19 NOTE — Progress Notes (Signed)
Case discussed with Dr. McLean soon after the resident saw the patient.  We reviewed the resident's history and exam and pertinent patient test results.  I agree with the assessment, diagnosis, and plan of care documented in the resident's note. 

## 2013-10-23 ENCOUNTER — Encounter: Payer: Self-pay | Admitting: Internal Medicine

## 2013-10-24 ENCOUNTER — Telehealth: Payer: Self-pay | Admitting: Hematology and Oncology

## 2013-10-24 ENCOUNTER — Other Ambulatory Visit (HOSPITAL_BASED_OUTPATIENT_CLINIC_OR_DEPARTMENT_OTHER): Payer: Medicare Other

## 2013-10-24 ENCOUNTER — Ambulatory Visit (HOSPITAL_BASED_OUTPATIENT_CLINIC_OR_DEPARTMENT_OTHER): Payer: Medicare Other | Admitting: Hematology and Oncology

## 2013-10-24 ENCOUNTER — Encounter: Payer: Self-pay | Admitting: Hematology and Oncology

## 2013-10-24 VITALS — BP 104/63 | HR 65 | Temp 98.2°F | Resp 20 | Ht 66.0 in | Wt 180.8 lb

## 2013-10-24 DIAGNOSIS — D45 Polycythemia vera: Secondary | ICD-10-CM

## 2013-10-24 DIAGNOSIS — D47Z9 Other specified neoplasms of uncertain behavior of lymphoid, hematopoietic and related tissue: Secondary | ICD-10-CM

## 2013-10-24 DIAGNOSIS — D471 Chronic myeloproliferative disease: Secondary | ICD-10-CM

## 2013-10-24 DIAGNOSIS — F17200 Nicotine dependence, unspecified, uncomplicated: Secondary | ICD-10-CM

## 2013-10-24 DIAGNOSIS — D751 Secondary polycythemia: Secondary | ICD-10-CM

## 2013-10-24 LAB — CBC WITH DIFFERENTIAL/PLATELET
BASO%: 0.7 % (ref 0.0–2.0)
Basophils Absolute: 0 10*3/uL (ref 0.0–0.1)
EOS%: 0.9 % (ref 0.0–7.0)
Eosinophils Absolute: 0.1 10*3/uL (ref 0.0–0.5)
HEMATOCRIT: 48.1 % (ref 38.4–49.9)
HGB: 16.2 g/dL (ref 13.0–17.1)
LYMPH%: 13.2 % — ABNORMAL LOW (ref 14.0–49.0)
MCH: 36 pg — AB (ref 27.2–33.4)
MCHC: 33.8 g/dL (ref 32.0–36.0)
MCV: 106.7 fL — ABNORMAL HIGH (ref 79.3–98.0)
MONO#: 0.7 10*3/uL (ref 0.1–0.9)
MONO%: 12.7 % (ref 0.0–14.0)
NEUT#: 4.2 10*3/uL (ref 1.5–6.5)
NEUT%: 72.5 % (ref 39.0–75.0)
Platelets: 143 10*3/uL (ref 140–400)
RBC: 4.51 10*6/uL (ref 4.20–5.82)
RDW: 24.1 % — AB (ref 11.0–14.6)
WBC: 5.8 10*3/uL (ref 4.0–10.3)
lymph#: 0.8 10*3/uL — ABNORMAL LOW (ref 0.9–3.3)

## 2013-10-24 NOTE — Telephone Encounter (Signed)
gv and printed appt sched and avs fo rpt for July °

## 2013-10-24 NOTE — Progress Notes (Signed)
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 In November 2014, we removed one unit of blood and order an additional workup to rule out myeloproliferative disorder. Blood test result confirmed low erythropoietin level along with detectable JAK 2 mutation, confirmed myeloproliferative disorder Unfortunately, bone marrow biopsy on 06/26/2013 was nondiagnostic On 07/31/13 the patient was started on 1 hydroxyurea a day On 08/15/2013, increased hydroxyurea to 2 tablets a day and remove one more unit of blood due to the high hemoglobin On 09/05/2013, I increased hydroxyurea to 3 tablets a day and remove one more unit of blood due to high hemoglobin and hematocrit  INTERVAL HISTORY: Mattison Stuckey 68 y.o. male returns for further followup. He is compliant with taking hydroxyurea. He is attempting to quit smoking, down to 2 cigarettes a day. Denies any chest pain, dizziness or headaches. He continues on anticoagulation therapy, managed by his primary care provider. The patient denies any recent signs or symptoms of bleeding such as spontaneous epistaxis, hematuria or hematochezia.  I have reviewed the past medical history, past surgical history, social history and family history with the patient and they are unchanged from previous note.  ALLERGIES:  is allergic to codeine.  MEDICATIONS:  Current Outpatient Prescriptions  Medication Sig Dispense Refill  . albuterol (PROAIR HFA) 108 (90 BASE) MCG/ACT inhaler Inhale into the lungs daily as needed for wheezing or shortness of breath.      [name] amiodarone (PACERONE) 200 MG tablet Take 1 tablet (200 mg total) by mouth daily.  30 tablet  5  . Budesonide (PULMICORT FLEXHALER) 90 MCG/ACT inhaler Inhale 1 puff into the lungs every  evening.      . carvedilol (COREG) 12.5 MG tablet Take 1 tablet (12.5 mg total) by mouth 2 (two) times daily with a meal.  180 tablet  1  . furosemide (LASIX) 40 MG tablet Take 0.5 tablets (20 mg total) by mouth daily.  30 tablet  1  . hydroxyurea (HYDREA) 500 MG capsule Take 500 mg by mouth 3 (three) times daily. May take with food to minimize GI side effects.      [name] lisinopril (PRINIVIL,ZESTRIL) 5 MG tablet Take 1 tablet (5 mg total) by mouth daily.  30 tablet  5  . neomycin-bacitracin-polymyxin (NEOSPORIN) OINT Apply 1 application topically daily as needed for wound care.      . warfarin (COUMADIN) 5 MG tablet Take 2.5-5 mg by mouth daily. Take 1 tablet (5 mg) on Tuesday and Friday, take 1/2 tablet (2.5 mg) on Sunday, Monday, Wednesday, Thursday, Saturday       No current facility-administered medications for this visit.     REVIEW OF SYSTEMS:   Constitutional: Denies fevers, chills or night sweats Eyes: Denies blurriness of vision Ears, nose, mouth, throat, and face: Denies mucositis or sore throat Respiratory: Denies cough, dyspnea or wheezes Cardiovascular: Denies palpitation, chest discomfort or lower extremity swelling Gastrointestinal:  Denies nausea, heartburn or change in bowel habits Skin: Denies abnormal skin rashes Lymphatics: Denies new lymphadenopathy or easy bruising Neurological:Denies numbness, tingling or new weaknesses Behavioral/Psych: Mood is stable, no new changes  All other systems were reviewed with the patient and are negative.  PHYSICAL EXAMINATION: ECOG PERFORMANCE STATUS: 0 - Asymptomatic  Filed Vitals:   10/24/13 0850  BP: 104/63  Pulse: 65  Temp: 98.2 F (  36.8 C)  Resp: 20   Filed Weights   10/24/13 0850  Weight: 180 lb 12.8 oz (82.01 kg)    GENERAL:alert, no distress and comfortable. He is mildly obese SKIN: skin color, texture, turgor are normal, no rashes or significant lesions EYES: normal, Conjunctiva are pink and non-injected, sclera  clear OROPHARYNX:no exudate, no erythema and lips, buccal mucosa, and tongue normal . Poor dentition is noted NECK: supple, thyroid normal size, non-tender, without nodularity LYMPH:  no palpable lymphadenopathy in the cervical, axillary or inguinal LUNGS: clear to auscultation and percussion with normal breathing effort HEART: regular rate & rhythm and no murmurs and no lower extremity edema ABDOMEN:abdomen soft, non-tender and normal bowel sounds Musculoskeletal:no cyanosis of digits and no clubbing  NEURO: alert & oriented x 3 with fluent speech, no focal motor/sensory deficits  LABORATORY DATA:  I have reviewed the data as listed Results for orders placed in visit on 10/24/13 (from the past 48 hour(s))  CBC WITH DIFFERENTIAL     Status: Abnormal   Collection Time    10/24/13  8:40 AM      Result Value Ref Range   WBC 5.8  4.0 - 10.3 10e3/uL   NEUT# 4.2  1.5 - 6.5 10e3/uL   HGB 16.2  13.0 - 17.1 g/dL   HCT 48.1  38.4 - 49.9 %   Platelets 143  140 - 400 10e3/uL   MCV 106.7 (*) 79.3 - 98.0 fL   MCH 36.0 (*) 27.2 - 33.4 pg   MCHC 33.8  32.0 - 36.0 g/dL   RBC 4.51  4.20 - 5.82 10e6/uL   RDW 24.1 (*) 11.0 - 14.6 %   lymph# 0.8 (*) 0.9 - 3.3 10e3/uL   MONO# 0.7  0.1 - 0.9 10e3/uL   Eosinophils Absolute 0.1  0.0 - 0.5 10e3/uL   Basophils Absolute 0.0  0.0 - 0.1 10e3/uL   NEUT% 72.5  39.0 - 75.0 %   LYMPH% 13.2 (*) 14.0 - 49.0 %   MONO% 12.7  0.0 - 14.0 %   EOS% 0.9  0.0 - 7.0 %   BASO% 0.7  0.0 - 2.0 %    Lab Results  Component Value Date   WBC 5.8 10/24/2013   HGB 16.2 10/24/2013   HCT 48.1 10/24/2013   MCV 106.7* 10/24/2013   PLT 143 10/24/2013    ASSESSMENT & PLAN:  #1 polycythemia vera His blood count and hematocrit finally came down to within acceptable limits. I recommend he continue on 3 hydroxyurea and day and I plan to see him back in 3 months.  #2 tobacco abuse I spent time educating the patient and the importance of nicotine cessation and he is attempting to quit on his  own.  All questions were answered. The patient knows to call the clinic with any problems, questions or concerns. No barriers to learning was detected.  I spent 15 minutes counseling the patient face to face. The total time spent in the appointment was 20 minutes and more than 50% was on counseling.     Fl[place], Logan, MD 10/24/2013 9:08 AM

## 2013-10-25 ENCOUNTER — Encounter: Payer: Self-pay | Admitting: Internal Medicine

## 2013-10-30 ENCOUNTER — Ambulatory Visit (INDEPENDENT_AMBULATORY_CARE_PROVIDER_SITE_OTHER): Payer: Medicare Other | Admitting: *Deleted

## 2013-10-30 DIAGNOSIS — R55 Syncope and collapse: Secondary | ICD-10-CM

## 2013-11-02 ENCOUNTER — Telehealth: Payer: Self-pay | Admitting: Pharmacist

## 2013-11-02 NOTE — Telephone Encounter (Signed)
Re-scheduled anticoagulation management clinic appointment from November 06, 2013 to November 13, 2013--same time. Spoke with patient.  

## 2013-11-06 ENCOUNTER — Ambulatory Visit: Payer: Medicare Other

## 2013-11-06 LAB — MDC_IDC_ENUM_SESS_TYPE_REMOTE

## 2013-11-08 ENCOUNTER — Other Ambulatory Visit: Payer: Self-pay | Admitting: *Deleted

## 2013-11-08 MED ORDER — CARVEDILOL 12.5 MG PO TABS: 12.5000 mg | ORAL_TABLET | Freq: Two times a day (BID) | ORAL | Status: DC

## 2013-11-13 ENCOUNTER — Ambulatory Visit (INDEPENDENT_AMBULATORY_CARE_PROVIDER_SITE_OTHER): Payer: Medicare Other | Admitting: Pharmacist

## 2013-11-13 DIAGNOSIS — I4891 Unspecified atrial fibrillation: Secondary | ICD-10-CM

## 2013-11-13 DIAGNOSIS — Z7901 Long term (current) use of anticoagulants: Secondary | ICD-10-CM

## 2013-11-13 LAB — POCT INR: INR: 2.5

## 2013-11-13 NOTE — Progress Notes (Signed)
Anti-Coagulation Progress Note  [name] is a 68 y.o. male who is currently on an anti-coagulation regimen.    RECENT RESULTS: Recent results are below, the most recent result is correlated with a dose of 22.5 mg. per week: Lab Results  Component Value Date   INR 2.50 11/13/2013   INR 2.10 10/09/2013   INR 3.10 09/11/2013    ANTI-COAG DOSE: Anticoagulation Dose Instructions as of 11/13/2013     [name] Tue Wed Thu Fri Sat   New Dose 2.5 mg 2.5 mg 5 mg 2.5 mg 2.5 mg 5 mg 2.5 mg       ANTICOAG SUMMARY: Anticoagulation Episode Summary   Current INR goal 2.0-3.0  Next INR check 12/18/2013  INR from last check 2.50 (11/13/2013)  Weekly max dose   Target end date Indefinite  [facility] check [facility]  Preferred lab   Send INR reminders to    Indications  Atrial fibrillation [427.31] Long term (current) use of anticoagulants [V58.61]        Comments         ANTICOAG TODAY: Anticoagulation Summary as of 11/13/2013   INR goal 2.0-3.0  Selected INR 2.50 (11/13/2013)  Next INR check 12/18/2013  Target end date Indefinite   Indications  Atrial fibrillation [427.31] Long term (current) use of anticoagulants [V58.61]      Anticoagulation Episode Summary   [facility] check [facility]   Preferred lab    Send INR reminders to    Comments       PATIENT INSTRUCTIONS: Patient Instructions  Patient instructed to take medications as defined in the Anti-coagulation Track section of this encounter.  Patient instructed to take today's dose.  Patient verbalized understanding of these instructions.       FOLLOW-UP Return in 5 weeks (on 12/18/2013) for Follow up INR at 0900h.  [name], III Pharm.D., CACP

## 2013-11-13 NOTE — Patient Instructions (Signed)
Patient instructed to take medications as defined in the Anti-coagulation Track section of this encounter.  Patient instructed to take today's dose.  Patient verbalized understanding of these instructions.    

## 2013-11-23 ENCOUNTER — Encounter: Payer: Self-pay | Admitting: Internal Medicine

## 2013-11-29 ENCOUNTER — Ambulatory Visit (INDEPENDENT_AMBULATORY_CARE_PROVIDER_SITE_OTHER): Payer: Medicare Other | Admitting: *Deleted

## 2013-11-29 DIAGNOSIS — R55 Syncope and collapse: Secondary | ICD-10-CM

## 2013-12-04 ENCOUNTER — Other Ambulatory Visit: Payer: Self-pay | Admitting: Internal Medicine

## 2013-12-05 ENCOUNTER — Encounter: Payer: Self-pay | Admitting: Internal Medicine

## 2013-12-18 ENCOUNTER — Ambulatory Visit: Payer: Medicare Other

## 2013-12-25 ENCOUNTER — Ambulatory Visit (INDEPENDENT_AMBULATORY_CARE_PROVIDER_SITE_OTHER): Payer: Medicare Other | Admitting: Pharmacist

## 2013-12-25 DIAGNOSIS — I4891 Unspecified atrial fibrillation: Secondary | ICD-10-CM

## 2013-12-25 DIAGNOSIS — Z7901 Long term (current) use of anticoagulants: Secondary | ICD-10-CM

## 2013-12-25 LAB — POCT INR: INR: 3

## 2013-12-25 NOTE — Progress Notes (Signed)
Anti-Coagulation Progress Note  [name] is a 68 y.o. male who is currently on an anti-coagulation regimen.    RECENT RESULTS: Recent results are below, the most recent result is correlated with a dose of 22.5 mg. per week: Lab Results  Component Value Date   INR 3.0 12/25/2013   INR 2.50 11/13/2013   INR 2.10 10/09/2013    ANTI-COAG DOSE: Anticoagulation Dose Instructions as of 12/25/2013     [name] Tue Wed Thu Fri Sat   New Dose 2.5 mg 2.5 mg 2.5 mg 2.5 mg 2.5 mg 5 mg 2.5 mg       ANTICOAG SUMMARY: Anticoagulation Episode Summary   Current INR goal 2.0-3.0  Next INR check 01/22/2014  INR from last check 3.0 (12/25/2013)  Weekly max dose   Target end date Indefinite  [facility]  Preferred lab   Send INR reminders to    Indications  Atrial fibrillation [427.31] Long term (current) use of anticoagulants [V58.61]        Comments         ANTICOAG TODAY: Anticoagulation Summary as of 12/25/2013   INR goal 2.0-3.0  Selected INR 3.0 (12/25/2013)  Next INR check 01/22/2014  Target end date Indefinite   Indications  Atrial fibrillation [427.31] Long term (current) use of anticoagulants [V58.61]      Anticoagulation Episode Summary   [facility] check [facility]   Preferred lab    Send INR reminders to    Comments       PATIENT INSTRUCTIONS: Patient Instructions  Patient instructed to take medications as defined in the Anti-coagulation Track section of this encounter.  Patient instructed to take today's dose.  Patient verbalized understanding of these instructions.       FOLLOW-UP Return in 4 weeks (on 01/22/2014) for Follow up INR at 0900h.  [name], III Pharm.D., CACP

## 2013-12-25 NOTE — Patient Instructions (Signed)
Patient instructed to take medications as defined in the Anti-coagulation Track section of this encounter.  Patient instructed to take today's dose.  Patient verbalized understanding of these instructions.    

## 2014-01-01 ENCOUNTER — Ambulatory Visit (INDEPENDENT_AMBULATORY_CARE_PROVIDER_SITE_OTHER): Payer: Medicare Other | Admitting: *Deleted

## 2014-01-01 DIAGNOSIS — R55 Syncope and collapse: Secondary | ICD-10-CM

## 2014-01-01 LAB — MDC_IDC_ENUM_SESS_TYPE_REMOTE

## 2014-01-03 NOTE — Progress Notes (Signed)
Loop recorder 

## 2014-01-09 LAB — MDC_IDC_ENUM_SESS_TYPE_REMOTE: Date Time Interrogation Session: 20150416040500

## 2014-01-12 ENCOUNTER — Emergency Department (HOSPITAL_COMMUNITY): Payer: Medicare Other

## 2014-01-12 ENCOUNTER — Inpatient Hospital Stay (HOSPITAL_COMMUNITY)
Admission: EM | Admit: 2014-01-12 | Discharge: 2014-01-16 | DRG: 682 | Disposition: A | Discharge status: Home / Self Care | Payer: Medicare Other | Attending: Internal Medicine | Admitting: Internal Medicine

## 2014-01-12 ENCOUNTER — Other Ambulatory Visit: Payer: Self-pay

## 2014-01-12 ENCOUNTER — Encounter (HOSPITAL_COMMUNITY): Payer: Self-pay | Admitting: Emergency Medicine

## 2014-01-12 DIAGNOSIS — D649 Anemia, unspecified: Secondary | ICD-10-CM | POA: Diagnosis present

## 2014-01-12 DIAGNOSIS — E872 Acidosis, unspecified: Secondary | ICD-10-CM | POA: Diagnosis present

## 2014-01-12 DIAGNOSIS — R791 Abnormal coagulation profile: Secondary | ICD-10-CM | POA: Diagnosis present

## 2014-01-12 DIAGNOSIS — I428 Other cardiomyopathies: Secondary | ICD-10-CM | POA: Diagnosis present

## 2014-01-12 DIAGNOSIS — E876 Hypokalemia: Secondary | ICD-10-CM | POA: Diagnosis present

## 2014-01-12 DIAGNOSIS — I482 Chronic atrial fibrillation, unspecified: Secondary | ICD-10-CM

## 2014-01-12 DIAGNOSIS — I10 Essential (primary) hypertension: Secondary | ICD-10-CM | POA: Diagnosis present

## 2014-01-12 DIAGNOSIS — R578 Other shock: Secondary | ICD-10-CM | POA: Diagnosis present

## 2014-01-12 DIAGNOSIS — N19 Unspecified kidney failure: Secondary | ICD-10-CM

## 2014-01-12 DIAGNOSIS — N179 Acute kidney failure, unspecified: Secondary | ICD-10-CM | POA: Diagnosis present

## 2014-01-12 DIAGNOSIS — E860 Dehydration: Secondary | ICD-10-CM

## 2014-01-12 DIAGNOSIS — K219 Gastro-esophageal reflux disease without esophagitis: Secondary | ICD-10-CM | POA: Diagnosis present

## 2014-01-12 DIAGNOSIS — Z7901 Long term (current) use of anticoagulants: Secondary | ICD-10-CM

## 2014-01-12 DIAGNOSIS — I4891 Unspecified atrial fibrillation: Secondary | ICD-10-CM | POA: Diagnosis present

## 2014-01-12 DIAGNOSIS — J4489 Other specified chronic obstructive pulmonary disease: Secondary | ICD-10-CM | POA: Diagnosis present

## 2014-01-12 DIAGNOSIS — J449 Chronic obstructive pulmonary disease, unspecified: Secondary | ICD-10-CM | POA: Diagnosis present

## 2014-01-12 DIAGNOSIS — F17200 Nicotine dependence, unspecified, uncomplicated: Secondary | ICD-10-CM | POA: Diagnosis present

## 2014-01-12 DIAGNOSIS — R571 Hypovolemic shock: Secondary | ICD-10-CM | POA: Diagnosis present

## 2014-01-12 DIAGNOSIS — K922 Gastrointestinal hemorrhage, unspecified: Secondary | ICD-10-CM | POA: Diagnosis present

## 2014-01-12 DIAGNOSIS — D45 Polycythemia vera: Secondary | ICD-10-CM | POA: Diagnosis present

## 2014-01-12 DIAGNOSIS — N170 Acute kidney failure with tubular necrosis: Principal | ICD-10-CM | POA: Diagnosis present

## 2014-01-12 DIAGNOSIS — Z79899 Other long term (current) drug therapy: Secondary | ICD-10-CM

## 2014-01-12 LAB — I-STAT ARTERIAL BLOOD GAS, ED
Acid-base deficit: 16 mmol/L — ABNORMAL HIGH (ref 0.0–2.0)
Bicarbonate: 12 mEq/L — ABNORMAL LOW (ref 20.0–24.0)
O2 SAT: 96 %
PCO2 ART: 33.5 mmHg — AB (ref 35.0–45.0)
PO2 ART: 101 mmHg — AB (ref 80.0–100.0)
Patient temperature: 98.6
TCO2: 13 mmol/L (ref 0–100)
pH, Arterial: 7.162 — CL (ref 7.350–7.450)

## 2014-01-12 LAB — CBC WITH DIFFERENTIAL/PLATELET
BASOS ABS: 0 10*3/uL (ref 0.0–0.1)
Basophils Relative: 0 % (ref 0–1)
EOS ABS: 0 10*3/uL (ref 0.0–0.7)
Eosinophils Relative: 0 % (ref 0–5)
HCT: 40.9 % (ref 39.0–52.0)
Hemoglobin: 14.2 g/dL (ref 13.0–17.0)
LYMPHS PCT: 12 % (ref 12–46)
Lymphs Abs: 1.7 10*3/uL (ref 0.7–4.0)
MCH: 42.9 pg — AB (ref 26.0–34.0)
MCHC: 34.7 g/dL (ref 30.0–36.0)
MCV: 123.6 fL — ABNORMAL HIGH (ref 78.0–100.0)
Monocytes Absolute: 0.9 10*3/uL (ref 0.1–1.0)
Monocytes Relative: 6 % (ref 3–12)
NEUTROS PCT: 82 % — AB (ref 43–77)
Neutro Abs: 11.8 10*3/uL — ABNORMAL HIGH (ref 1.7–7.7)
PLATELETS: 226 10*3/uL (ref 150–400)
RBC: 3.31 MIL/uL — ABNORMAL LOW (ref 4.22–5.81)
RDW: 13.6 % (ref 11.5–15.5)
WBC: 14.4 10*3/uL — ABNORMAL HIGH (ref 4.0–10.5)

## 2014-01-12 LAB — COMPREHENSIVE METABOLIC PANEL
ALBUMIN: 3.7 g/dL (ref 3.5–5.2)
ALK PHOS: 94 U/L (ref 39–117)
ALT: 8 U/L (ref 0–53)
AST: 13 U/L (ref 0–37)
BILIRUBIN TOTAL: 0.2 mg/dL — AB (ref 0.3–1.2)
BUN: 88 mg/dL — ABNORMAL HIGH (ref 6–23)
CHLORIDE: 96 meq/L (ref 96–112)
CO2: 13 mEq/L — ABNORMAL LOW (ref 19–32)
Calcium: 8.6 mg/dL (ref 8.4–10.5)
Creatinine, Ser: 11.61 mg/dL — ABNORMAL HIGH (ref 0.50–1.35)
GFR calc Af Amer: 5 mL/min — ABNORMAL LOW (ref >90)
GFR calc non Af Amer: 4 mL/min — ABNORMAL LOW (ref >90)
Glucose, Bld: 109 mg/dL — ABNORMAL HIGH (ref 70–99)
POTASSIUM: 4.3 meq/L (ref 3.7–5.3)
Sodium: 137 mEq/L (ref 137–147)
Total Protein: 7.9 g/dL (ref 6.0–8.3)

## 2014-01-12 LAB — PRO B NATRIURETIC PEPTIDE: PRO B NATRI PEPTIDE: 1110 pg/mL — AB (ref 0–125)

## 2014-01-12 LAB — I-STAT TROPONIN, ED: Troponin i, poc: 0.02 ng/mL (ref 0.00–0.08)

## 2014-01-12 LAB — PROTIME-INR
INR: 5.78 — AB (ref 0.00–1.49)
Prothrombin Time: 49 seconds — ABNORMAL HIGH (ref 11.6–15.2)

## 2014-01-12 LAB — I-STAT CG4 LACTIC ACID, ED: Lactic Acid, Venous: 1.36 mmol/L (ref 0.5–2.2)

## 2014-01-12 MED ORDER — SODIUM CHLORIDE 0.9 % IV BOLUS (SEPSIS)
1000.0000 mL | Freq: Once | INTRAVENOUS | Status: AC
  Administered 2014-01-12: 1000 mL via INTRAVENOUS

## 2014-01-12 MED ORDER — SODIUM BICARBONATE 8.4 % IV SOLN
INTRAVENOUS | Status: DC
  Administered 2014-01-12: via INTRAVENOUS
  Filled 2014-01-12 (×2): qty 100

## 2014-01-12 MED ORDER — SODIUM BICARBONATE 8.4 % IV SOLN
50.0000 meq | Freq: Once | INTRAVENOUS | Status: AC
  Administered 2014-01-12: 50 meq via INTRAVENOUS
  Filled 2014-01-12: qty 50

## 2014-01-12 MED ORDER — SODIUM CHLORIDE 0.9 % IV BOLUS (SEPSIS)
2000.0000 mL | Freq: Once | INTRAVENOUS | Status: AC
  Administered 2014-01-12: 2000 mL via INTRAVENOUS

## 2014-01-12 NOTE — ED Notes (Signed)
Pt c/o weakness, dizziness and "passing out" several times over the last few days.  Pt states that he fell two days ago and is unsure if he hit his head or not.  Pt is alert and oriented, denies any chest pain, denies head pain, c/o dizziness and the room spinning.  Pt states he vomited once "the other day," and had several bouts of diarrhea.

## 2014-01-12 NOTE — ED Provider Notes (Addendum)
CSN: [id number]     Arrival date & time 01/12/14  2113 History   First MD Initiated Contact with Patient 01/12/14 2148     Chief Complaint  Patient presents with  . Fall  . Dizziness     (Consider location/radiation/quality/duration/timing/severity/associated sxs/prior Treatment) The history is provided by the patient.  [name] is a 68 y.o. male hx of HTN, afib on coumadin, cardiomyopathy with EF 55% here with nausea, syncope, dizziness. Nauseated since yesterday. Decreased PO intake. Has been feeling light headed and dizzy and has several episodes of passing out. ? Head injury but no headaches or vomiting. Denies chest pain or abdominal pain.      Past Medical History  Diagnosis Date  . Cardiomyopathy     EF55% 11/14<<35%   . Substance abuse     alcohol  . Hypertension   . Atrial fibrillation /flutter     hx of flutter ablation 2/12  . Shortness of breath     "all the time lately" (12/14/2012)  . GERD (gastroesophageal reflux disease)   . Hepatitis     "think I had that once; a long time ago; from dirty needles I think" (12/14/2012)  . 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 Leukocytosis, unspecified 06/12/2013  . 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 Obesity   . Syncope   . Syncope    Past Surgical History  Procedure Laterality Date  . Hemorrhoid surgery  1970's  . Cardiac electrophysiology mapping and ablation  08/2010    [name] 09/07/2010 (12/14/2012)  . Loop recorder implant  08-22-2013    MDT LinQ implanted by Dr Caryl Comes for syncope   Family History  Problem Relation Age of Onset  . Diabetes Mother   . Hypertension Mother   . Cirrhosis Father   . Alcohol abuse Father    History  Substance Use Topics  . Smoking status: Current Every Day Smoker -- 0.20 packs/day for 40 years    Types: Cigarettes  . Smokeless tobacco: Never Used     Comment: patient smoked 1-2ppd x 50 years, now smoking approx 4-5 cigs per day x 1 year  . Alcohol Use: 0.0 oz/week   Comment: drinks a beer once in a while for the past year, but has h/o heavy alcohol use for many years before this    Review of Systems  Gastrointestinal: Positive for nausea.  Neurological: Positive for dizziness and syncope.  All other systems reviewed and are negative.     Allergies  Codeine  Home Medications   Prior to Admission medications   Medication Sig Start Date End Date Taking? Authorizing Sabreena Vogan  albuterol (PROAIR HFA) 108 (90 BASE) MCG/ACT inhaler Inhale into the lungs daily as needed for wheezing or shortness of breath.   Yes Historical Rinda Rollyson, MD  amiodarone (PACERONE) 200 MG tablet Take 1 tablet (200 mg total) by mouth daily. 09/07/13  Yes Cresenciano Genre, MD  Budesonide (PULMICORT FLEXHALER) 90 MCG/ACT inhaler Inhale 1 puff into the lungs every evening.   Yes Historical Jayvier Burgher, MD  carvedilol (COREG) 12.5 MG tablet Take 1 tablet (12.5 mg total) by mouth 2 (two) times daily with a meal. 11/08/13  Yes Cresenciano Genre, MD  furosemide (LASIX) 40 MG tablet Take 0.5 tablets (20 mg total) by mouth daily. 08/14/13  Yes Cresenciano Genre, MD  hydroxyurea (HYDREA) 500 MG capsule Take 500 mg by mouth 3 (three) times daily. May take with food to minimize GI side effects.   Yes Historical Boe Deans, MD  lisinopril (PRINIVIL,ZESTRIL)  5 MG tablet Take 1 tablet (5 mg total) by mouth daily. 09/07/13  Yes Cresenciano Genre, MD  neomycin-bacitracin-polymyxin (NEOSPORIN) OINT Apply 1 application topically daily as needed for wound care.   Yes Historical Trinette Vera, MD  warfarin (COUMADIN) 5 MG tablet Take 2.5-5 mg by mouth daily. Take 2.5mg  on all days including Sundays EXCEPT on Friday take 5mg .   Yes Historical Joell Buerger, MD   BP 82/49  Pulse 71  Resp 18  SpO2 100% Physical Exam  Nursing note and vitals reviewed. Constitutional: He is oriented to person, place, and time.  Ill appearing   HENT:  Head: Normocephalic.  MM dry   Eyes: Conjunctivae are normal. Pupils are equal, round, and  reactive to light.  Neck: Normal range of motion. Neck supple.  Cardiovascular: Normal rate and normal heart sounds.   Pulmonary/Chest: Effort normal and breath sounds normal. No respiratory distress. He has no wheezes. He has no rales.  Abdominal: Soft. Bowel sounds are normal. He exhibits no distension. There is no tenderness. There is no rebound and no guarding.  Musculoskeletal: Normal range of motion. He exhibits no edema and no tenderness.  Neurological: He is alert and oriented to person, place, and time. No cranial nerve deficit. Coordination normal.  Skin: Skin is warm and dry.  Psychiatric: He has a normal mood and affect. His behavior is normal. Judgment and thought content normal.    ED Course  Procedures (including critical care time)  CRITICAL CARE Performed by: Darl Householder, DAVID   Total critical care time: 30 min   Critical care time was exclusive of separately billable procedures and treating other patients.  Critical care was necessary to treat or prevent imminent or life-threatening deterioration.  Critical care was time spent personally by me on the following activities: development of treatment plan with patient and/or surrogate as well as nursing, discussions with consultants, evaluation of patient's response to treatment, examination of patient, obtaining history from patient or surrogate, ordering and performing treatments and interventions, ordering and review of laboratory studies, ordering and review of radiographic studies, pulse oximetry and re-evaluation of patient's condition.   Labs Review Labs Reviewed  CBC WITH DIFFERENTIAL - Abnormal; Notable for the following:    WBC 14.4 (*)    RBC 3.31 (*)    MCV 123.6 (*)    MCH 42.9 (*)    Neutrophils Relative % 82 (*)    Neutro Abs 11.8 (*)    All other components within normal limits  COMPREHENSIVE METABOLIC PANEL - Abnormal; Notable for the following:    CO2 13 (*)    Glucose, Bld 109 (*)    BUN 88 (*)     Creatinine, Ser 11.61 (*)    Total Bilirubin 0.2 (*)    GFR calc non Af Amer 4 (*)    GFR calc Af Amer 5 (*)    All other components within normal limits  PROTIME-INR - Abnormal; Notable for the following:    Prothrombin Time 49.0 (*)    INR 5.78 (*)    All other components within normal limits  PRO B NATRIURETIC PEPTIDE - Abnormal; Notable for the following:    Pro B Natriuretic peptide (BNP) 1110.0 (*)    All other components within normal limits  I-STAT ARTERIAL BLOOD GAS, ED - Abnormal; Notable for the following:    pH, Arterial 7.162 (*)    pCO2 arterial 33.5 (*)    pO2, Arterial 101.0 (*)    Bicarbonate 12.0 (*)  Acid-base deficit 16.0 (*)    All other components within normal limits  URINE CULTURE  URINALYSIS, ROUTINE W REFLEX MICROSCOPIC  BLOOD GAS, ARTERIAL  I-STAT TROPOININ, ED  I-STAT CG4 LACTIC ACID, ED    Imaging Review Ct Head Wo Contrast  01/12/2014   CLINICAL DATA:  Dizziness, fall.  EXAM: CT HEAD WITHOUT CONTRAST  TECHNIQUE: Contiguous axial images were obtained from the base of the skull through the vertex without intravenous contrast.  COMPARISON:  CT scan of September 17, 2013.  FINDINGS: Bony calvarium appears intact. Minimal chronic ischemic white matter disease is noted. No mass effect or midline shift is noted. Ventricular size is within normal limits. There is no evidence of mass lesion, hemorrhage or acute infarction.  IMPRESSION: Minimal chronic ischemic white matter disease. No acute intracranial abnormality seen.   Electronically Signed   By: Sabino Dick M.D.   On: 01/12/2014 22:23   Dg Chest Portable 1 View  01/12/2014   CLINICAL DATA:  Weakness and hypertension. History of atrial fibrillation  EXAM: PORTABLE CHEST - 1 VIEW  COMPARISON:  08/22/2013  FINDINGS: There is mild limitation related to exclusion of the lateral right costophrenic sulcus.  No cardiomegaly for technique. Stable mediastinal contours. There is an implantable event recorder over the left  chest. No consolidation, edema, effusion, or pneumothorax.  IMPRESSION: No active disease.   Electronically Signed   By: Jorje Guild M.D.   On: 01/12/2014 22:51     EKG Interpretation None      MDM   Final diagnoses:  None    [name] is a 68 y.o. male here with hypotension, dizziness. I think dizziness likely from hypotension. Given recent head injury on coumadin, will do CT head. Afebrile, no signs of sepsis. Will check labs and hydrate.   11:36 PM Bicarb 14, 12 on ABG. Acidotic to 7.1. Cr. 11. I called nephrology to see patient. After 4 L NS still hypotensive. I called critical care to admit for dehydration, acute renal failure, acidosis.     Wandra Arthurs, MD 01/12/14 2353  Wandra Arthurs, MD 01/12/14 [phone]

## 2014-01-12 NOTE — ED Notes (Signed)
Pt. felt dizzy/lightheaded and fell x3 yesterday , denies injury or pain , dizziness and lightheadedness continues today . Alert and oriented , speech clear , no facial asymmetry , equal grips , no arm drift. Respirations unlabored .

## 2014-01-13 ENCOUNTER — Inpatient Hospital Stay (HOSPITAL_COMMUNITY): Payer: Medicare Other

## 2014-01-13 DIAGNOSIS — E872 Acidosis, unspecified: Secondary | ICD-10-CM

## 2014-01-13 DIAGNOSIS — R571 Hypovolemic shock: Secondary | ICD-10-CM | POA: Diagnosis present

## 2014-01-13 DIAGNOSIS — N19 Unspecified kidney failure: Secondary | ICD-10-CM

## 2014-01-13 LAB — BASIC METABOLIC PANEL
BUN: 82 mg/dL — AB (ref 6–23)
BUN: 83 mg/dL — AB (ref 6–23)
CALCIUM: 7.3 mg/dL — AB (ref 8.4–10.5)
CO2: 14 mEq/L — ABNORMAL LOW (ref 19–32)
CO2: 16 mEq/L — ABNORMAL LOW (ref 19–32)
CREATININE: 10.43 mg/dL — AB (ref 0.50–1.35)
CREATININE: 10.56 mg/dL — AB (ref 0.50–1.35)
Calcium: 7.3 mg/dL — ABNORMAL LOW (ref 8.4–10.5)
Chloride: 105 mEq/L (ref 96–112)
Chloride: 104 mEq/L (ref 96–112)
GFR calc non Af Amer: 4 mL/min — ABNORMAL LOW (ref >90)
GFR, EST AFRICAN AMERICAN: 5 mL/min — AB (ref >90)
GFR, EST AFRICAN AMERICAN: 5 mL/min — AB (ref >90)
GFR, EST NON AFRICAN AMERICAN: 4 mL/min — AB (ref >90)
Glucose, Bld: 111 mg/dL — ABNORMAL HIGH (ref 70–99)
Glucose, Bld: 111 mg/dL — ABNORMAL HIGH (ref 70–99)
Potassium: 4.3 mEq/L (ref 3.7–5.3)
Potassium: 4.1 mEq/L (ref 3.7–5.3)
Sodium: 142 mEq/L (ref 137–147)
Sodium: 141 mEq/L (ref 137–147)

## 2014-01-13 LAB — CBC
HEMATOCRIT: 32.5 % — AB (ref 39.0–52.0)
Hemoglobin: 11.2 g/dL — ABNORMAL LOW (ref 13.0–17.0)
MCH: 42.1 pg — AB (ref 26.0–34.0)
MCHC: 34.5 g/dL (ref 30.0–36.0)
MCV: 122.2 fL — ABNORMAL HIGH (ref 78.0–100.0)
Platelets: 155 10*3/uL (ref 150–400)
RBC: 2.66 MIL/uL — ABNORMAL LOW (ref 4.22–5.81)
RDW: 13.5 % (ref 11.5–15.5)
WBC: 10.2 10*3/uL (ref 4.0–10.5)

## 2014-01-13 LAB — MRSA PCR SCREENING: MRSA BY PCR: POSITIVE — AB

## 2014-01-13 LAB — POCT I-STAT 3, ART BLOOD GAS (G3+)
Acid-base deficit: 9 mmol/L — ABNORMAL HIGH (ref 0.0–2.0)
Bicarbonate: 16.8 mEq/L — ABNORMAL LOW (ref 20.0–24.0)
O2 Saturation: 93 %
PH ART: 7.303 — AB (ref 7.350–7.450)
PO2 ART: 75 mmHg — AB (ref 80.0–100.0)
Patient temperature: 98.7
TCO2: 18 mmol/L (ref 0–100)
pCO2 arterial: 33.9 mmHg — ABNORMAL LOW (ref 35.0–45.0)

## 2014-01-13 LAB — URINE MICROSCOPIC-ADD ON

## 2014-01-13 LAB — URINALYSIS, ROUTINE W REFLEX MICROSCOPIC
BILIRUBIN URINE: NEGATIVE
Glucose, UA: 100 mg/dL — AB
Ketones, ur: NEGATIVE mg/dL
LEUKOCYTES UA: NEGATIVE
Nitrite: NEGATIVE
PH: 5 (ref 5.0–8.0)
Protein, ur: 30 mg/dL — AB
Specific Gravity, Urine: 1.019 (ref 1.005–1.030)
UROBILINOGEN UA: 0.2 mg/dL (ref 0.0–1.0)

## 2014-01-13 LAB — GLUCOSE, CAPILLARY: GLUCOSE-CAPILLARY: 121 mg/dL — AB (ref 70–99)

## 2014-01-13 LAB — PROTEIN, URINE, RANDOM: Total Protein, Urine: 120.7 mg/dL

## 2014-01-13 LAB — SODIUM, URINE, RANDOM: Sodium, Ur: 80 mEq/L

## 2014-01-13 LAB — UREA NITROGEN, URINE: UREA NITROGEN UR: 148 mg/dL

## 2014-01-13 LAB — PROTIME-INR
INR: 8 (ref 0.00–1.49)
PROTHROMBIN TIME: 67 s — AB (ref 11.6–15.2)

## 2014-01-13 LAB — CREATININE, URINE, RANDOM: Creatinine, Urine: 182.73 mg/dL

## 2014-01-13 MED ORDER — PHYTONADIONE 5 MG PO TABS
5.0000 mg | ORAL_TABLET | Freq: Once | ORAL | Status: AC
  Administered 2014-01-13: 5 mg via ORAL
  Filled 2014-01-13: qty 1

## 2014-01-13 MED ORDER — MUPIROCIN 2 % EX OINT
1.0000 "application " | TOPICAL_OINTMENT | Freq: Two times a day (BID) | CUTANEOUS | Status: DC
  Administered 2014-01-13 – 2014-01-16 (×6): 1 via NASAL
  Filled 2014-01-13: qty 22

## 2014-01-13 MED ORDER — CHLORHEXIDINE GLUCONATE CLOTH 2 % EX PADS
6.0000 | MEDICATED_PAD | Freq: Every day | CUTANEOUS | Status: DC
  Administered 2014-01-13 – 2014-01-15 (×3): 6 via TOPICAL

## 2014-01-13 MED ORDER — METOPROLOL TARTRATE 1 MG/ML IV SOLN: 2.5000 mg | INTRAVENOUS | Status: DC | PRN

## 2014-01-13 MED ORDER — SODIUM CHLORIDE 0.9 % IV SOLN: 250.0000 mL | INTRAVENOUS | Status: DC | PRN

## 2014-01-13 MED ORDER — CARVEDILOL 3.125 MG PO TABS
3.1250 mg | ORAL_TABLET | Freq: Two times a day (BID) | ORAL | Status: DC
  Filled 2014-01-13 (×3): qty 1

## 2014-01-13 MED ORDER — ALBUTEROL SULFATE (2.5 MG/3ML) 0.083% IN NEBU
2.5000 mg | INHALATION_SOLUTION | RESPIRATORY_TRACT | Status: DC
  Administered 2014-01-13 – 2014-01-14 (×7): 2.5 mg via RESPIRATORY_TRACT
  Filled 2014-01-13 (×8): qty 3

## 2014-01-13 MED ORDER — ALBUTEROL SULFATE HFA 108 (90 BASE) MCG/ACT IN AERS: 2.0000 | INHALATION_SPRAY | RESPIRATORY_TRACT | Status: DC

## 2014-01-13 MED ORDER — FLUTICASONE PROPIONATE HFA 44 MCG/ACT IN AERO
2.0000 | INHALATION_SPRAY | Freq: Two times a day (BID) | RESPIRATORY_TRACT | Status: DC
  Administered 2014-01-13 – 2014-01-16 (×7): 2 via RESPIRATORY_TRACT
  Filled 2014-01-13: qty 10.6

## 2014-01-13 MED ORDER — SODIUM CHLORIDE 0.9 % IV BOLUS (SEPSIS): 500.0000 mL | Freq: Once | INTRAVENOUS | Status: DC

## 2014-01-13 MED ORDER — SODIUM BICARBONATE 8.4 % IV SOLN
INTRAVENOUS | Status: DC
  Administered 2014-01-13 – 2014-01-14 (×2): via INTRAVENOUS
  Filled 2014-01-13 (×5): qty 1000

## 2014-01-13 MED ORDER — SODIUM CHLORIDE 0.9 % IV SOLN
INTRAVENOUS | Status: AC
  Administered 2014-01-13: 08:00:00 via INTRAVENOUS

## 2014-01-13 MED ORDER — SODIUM CHLORIDE 0.9 % IV BOLUS (SEPSIS)
1000.0000 mL | Freq: Once | INTRAVENOUS | Status: AC
  Administered 2014-01-13: 1000 mL via INTRAVENOUS

## 2014-01-13 MED ORDER — MORPHINE SULFATE 2 MG/ML IJ SOLN: 2.0000 mg | INTRAMUSCULAR | Status: DC | PRN

## 2014-01-13 NOTE — H&P (Signed)
PULMONARY / CRITICAL CARE MEDICINE   Name: [name] MRN: [id number] DOB: July 14, 1946    ADMISSION DATE:  01/12/2014 CONSULTATION DATE:  01/12/2014   REFERRING MD :  Dr. Darl Householder PRIMARY SERVICE: PCCM  CHIEF COMPLAINT:  Hypotension and acute renal failure  BRIEF PATIENT DESCRIPTION: 68 y/o man with polycythemia vera, HTN, COPD who presented with syncope, found to have acute renal failure with Cr of ~12.  SIGNIFICANT EVENTS / STUDIES:  6/26 Head CT - normal 6/26 renal ultrasound - pending  LINES / TUBES: PIV  CULTURES: none  ANTIBIOTICS: none  HISTORY OF PRESENT ILLNESS:  [name] is a 68 y/o man with a history of polycythemia vera (positive Jak2 mutation, on hydroxyurea), HTN (on lisinopril and lasix) and COPD (on budesonide), a-fib (carvedilol, amiodarone, warfarin) who presented with a one day history of syncope.  He was found to have a systolic blood pressure in the 60s on admission.  His labs revealed a Creatinine of close to 12 and BUN of 88 and bicarb of 13.  He reports a two day history of severe diarrhea which resolved 2 days ago.  He denies any changes in his urine (color or frequency), and abdominal pain or any vomiting.  He has not started any new medications or changed his dosage in the last few weeks.   PAST MEDICAL HISTORY :  Past Medical History  Diagnosis Date  . Cardiomyopathy     EF55% 11/14<<35%   . Substance abuse     alcohol  . Hypertension   . Atrial fibrillation /flutter     hx of flutter ablation 2/12  . Shortness of breath     "all the time lately" (12/14/2012)  . GERD (gastroesophageal reflux disease)   . Hepatitis     "think I had that once; a long time ago; from dirty needles I think" (12/14/2012)  . 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 Leukocytosis, unspecified 06/12/2013  . 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 Obesity   . Syncope   . Syncope    Past Surgical History  Procedure Laterality Date  . Hemorrhoid surgery  1970's   . Cardiac electrophysiology mapping and ablation  08/2010    [name] 09/07/2010 (12/14/2012)  . Loop recorder implant  08-22-2013    MDT LinQ implanted by Dr Caryl Comes for syncope   Prior to Admission medications   Medication Sig Start Date End Date Taking? Authorizing Abygayle Deltoro  albuterol (PROAIR HFA) 108 (90 BASE) MCG/ACT inhaler Inhale into the lungs daily as needed for wheezing or shortness of breath.   Yes Historical Allanna Bresee, MD  amiodarone (PACERONE) 200 MG tablet Take 1 tablet (200 mg total) by mouth daily. 09/07/13  Yes Cresenciano Genre, MD  Budesonide (PULMICORT FLEXHALER) 90 MCG/ACT inhaler Inhale 1 puff into the lungs every evening.   Yes Historical Donnis Phaneuf, MD  carvedilol (COREG) 12.5 MG tablet Take 1 tablet (12.5 mg total) by mouth 2 (two) times daily with a meal. 11/08/13  Yes Cresenciano Genre, MD  furosemide (LASIX) 40 MG tablet Take 0.5 tablets (20 mg total) by mouth daily. 08/14/13  Yes Cresenciano Genre, MD  hydroxyurea (HYDREA) 500 MG capsule Take 500 mg by mouth 3 (three) times daily. May take with food to minimize GI side effects.   Yes Historical Tayler Heiden, MD  lisinopril (PRINIVIL,ZESTRIL) 5 MG tablet Take 1 tablet (5 mg total) by mouth daily. 09/07/13  Yes Cresenciano Genre, MD  neomycin-bacitracin-polymyxin (NEOSPORIN) OINT Apply 1 application topically daily as needed for wound care.  Yes Historical Cliford Sequeira, MD  warfarin (COUMADIN) 5 MG tablet Take 2.5-5 mg by mouth daily. Take 2.5mg  on all days including Sundays EXCEPT on Friday take 5mg.   Yes Historical Kaylla Cobos, MD   Allergies  Allergen Reactions  . Codeine Rash    FAMILY HISTORY:  Family History  Problem Relation Age of Onset  . Diabetes Mother   . Hypertension Mother   . Cirrhosis Father   . Alcohol abuse Father    SOCIAL HISTORY:  reports that he has been smoking Cigarettes.  He has a 8 pack-year smoking history. He has never used smokeless tobacco. He reports that he drinks alcohol. He reports that he uses illicit drugs  ("Crack" cocaine, Cocaine, and Heroin).  REVIEW OF SYSTEMS:  A review of 14 systems was negative except as stated in the HPI.   SUBJECTIVE:   VITAL SIGNS: Pulse Rate:  [66-77] 72 (06/27 0000) Resp:  [12-18] 13 (06/27 0000) BP: (61-93)/(38-58) 77/47 mmHg (06/27 0000) SpO2:  [94 %-100 %] 100 % (06/27 0000) HEMODYNAMICS:   VENTILATOR SETTINGS:   INTAKE / OUTPUT: Intake/Output     06 /26 0701 - 06/27 0700   Urine 1   Total Output 1   Net -1         PHYSICAL EXAMINATION: General:  Sitting up on stretcher, complaining of abdominal pain from foley placement Neuro:  Alert and oriented x4, normal strength and tone, CNII-VI intact HEENT:  PERRL, sclera injected, normal mucosa Cardiovascular:  Irregularly irregular, no MRG Lungs:  CTAB, no wrr Abdomen:  Soft, non-distended, pelvic pain Musculoskeletal:  No clubbing or edema Skin:  No rashes, sores or other lesions.   LABS:  CBC  Recent Labs Lab 01/12/14 2147  WBC 14.4*  HGB 14.2  HCT 40.9  PLT 226   Coag's  Recent Labs Lab 01/12/14 2148  INR 5.78*   BMET  Recent Labs Lab 01/12/14 2147  NA 137  K 4.3  CL 96  CO2 13*  BUN 88*  CREATININE 11.61*  GLUCOSE 109*   Electrolytes  Recent Labs Lab 01/12/14 2147  CALCIUM 8.6   Sepsis Markers  Recent Labs Lab 01/12/14 2226  LATICACIDVEN 1.36   ABG  Recent Labs Lab 01/12/14 2324  PHART 7.162*  PCO2ART 33.5*  PO2ART 101.0*   Liver Enzymes  Recent Labs Lab 01/12/14 2147  AST 13  ALT 8  ALKPHOS 94  BILITOT 0.2*  ALBUMIN 3.7   Cardiac Enzymes  Recent Labs Lab 01/12/14 2148  PROBNP 1110.0*   Glucose No results found for this basename: GLUCAP,  in the last 168 hours  Imaging Ct Head Wo Contrast  01/12/2014   CLINICAL DATA:  Dizziness, fall.  EXAM: CT HEAD WITHOUT CONTRAST  TECHNIQUE: Contiguous axial images were obtained from the base of the skull through the vertex without intravenous contrast.  COMPARISON:  CT scan of September 17, 2013.   FINDINGS: Bony calvarium appears intact. Minimal chronic ischemic white matter disease is noted. No mass effect or midline shift is noted. Ventricular size is within normal limits. There is no evidence of mass lesion, hemorrhage or acute infarction.  IMPRESSION: Minimal chronic ischemic white matter disease. No acute intracranial abnormality seen.   Electronically Signed   By: Sabino Dick M.D.   On: 01/12/2014 22:23   Dg Chest Portable 1 View  01/12/2014   CLINICAL DATA:  Weakness and hypertension. History of atrial fibrillation  EXAM: PORTABLE CHEST - 1 VIEW  COMPARISON:  08/22/2013  FINDINGS: There is mild  limitation related to exclusion of the lateral right costophrenic sulcus.  No cardiomegaly for technique. Stable mediastinal contours. There is an implantable event recorder over the left chest. No consolidation, edema, effusion, or pneumothorax.  IMPRESSION: No active disease.   Electronically Signed   By: Jorje Guild M.D.   On: 01/12/2014 22:51      ASSESSMENT / PLAN:  PULMONARY A:COPD P:   Continue home inhalers O2 to maintain saturations >92%  CARDIOVASCULAR A: A-fib P:  Continue low dose of carvedilol Hold amiodarone for now  RENAL A:  Acute renal failure, metabolic acidosis P:   Metabolic acidosis secondary to renal failure - BiCarb infusion Most likely secondary to volume depletion from diarrhea and continuing to take lasix in the setting of hydroxyurea and lisinopril. Last Cr was checked in April and was 0.99 Renal US pending SPEP/UPEP pending FeNa and FeUrea pending Urine protein pending Nephrology consulted in ED and will see in AM  GASTROINTESTINAL A:  No acute problems P:   Clear liquid diet  HEMATOLOGIC A:  Polycythemia vera P:  Hold hydroxyurea for now     TODAY'S SUMMARY: Admitted to ICU for hypovolemic shock and acute renal failure.   I have personally obtained a history, examined the patient, evaluated laboratory and imaging results,  formulated the assessment and plan and placed orders. CRITICAL CARE: The patient is critically ill with multiple organ systems failure and requires high complexity decision making for assessment and support, frequent evaluation and titration of therapies, application of advanced monitoring technologies and extensive interpretation of multiple databases. Critical Care Time devoted to patient care services described in this note is 35 minutes.   Reginia Forts MD, PhD Pulmonary and [place] Pager: [phone]  01/13/2014, 12:40 AM

## 2014-01-13 NOTE — Progress Notes (Signed)
CRITICAL VALUE ALERT  Critical value received:  INR 8.00  Date of notification:  01/13/2014   Time of notification:  3:56 AM   Critical value read back:Yes.    Nurse who received alert:  Alfonse Ras, Janora Norlander   MD notified (1st page): Dr. Halford Chessman  Time of first page:  3:57 AM

## 2014-01-13 NOTE — ED Notes (Addendum)
Ultrasound technician at bedside to do renal ultrasound

## 2014-01-13 NOTE — Progress Notes (Signed)
UR Completed.  Hamilton, Ryan Jane [phone] 01/13/2014  

## 2014-01-13 NOTE — Consult Note (Signed)
[facility] Renal Consultation Note  Requesting MD: Titus Mould Indication for Consultation: AKI  HPI:  [name] is a 68 y.o. male with past medical history significant for hypertension, cardiomyopathy with an ejection fraction of 35% with atrial fibrillation, obesity as well as polycythemia vera and COPD. He has no known history of renal disease. Creatinine in February of this year was 0.85. He presented to the emergency department yesterday with syncope. He was found to have a systolic blood pressure in the 60s and labs showed a creatinine of 11.6 with bicarbonate 13. He had a history of a recent GI illness. He denies using any nonsteroidal anti-inflammatory drugs. He continued on his home medications including lisinopril and Lasix.  his urinalysis showed granular casts. Overnight, he has been in the intensive care unit. He received IV fluids. His urine output is now starting to pick up.  Creatinine not significantly changed down to 10.4 and bicarbonate up to 16. He states he feels well and and better than when he presented. This morning his blood pressure was so on the low side. I had him give him a liter of fluid which resulted in improvement in blood pressure and that her urine output.  He is on continuous bicarbonate at 100 an hour as well.  Also of note, his INR was 8 and he received vitamin K- his hgb is in the 11's which I assume is worse than his baseline with a history of polycythemia.   Creat  Date/Time Value Ref Range Status  03/24/2013  2:09 PM 0.94  0.50 - 1.35 mg/dL Final  12/20/2012  4:45 PM 1.04  0.50 - 1.35 mg/dL Final  09/15/2012  4:17 PM 1.10  0.50 - 1.35 mg/dL Final     Creatinine  Date/Time Value Ref Range Status  06/12/2013 11:25 AM 0.9  0.7 - 1.3 mg/dL Final     Creatinine, Ser  Date/Time Value Ref Range Status  01/13/2014  3:13 AM 10.43* 0.50 - 1.35 mg/dL Final  01/13/2014 12:39 AM 10.56* 0.50 - 1.35 mg/dL Final  01/12/2014  9:47 PM 11.61* 0.50 - 1.35  mg/dL Final  08/23/2013  3:10 AM 0.85  0.50 - 1.35 mg/dL Final  08/22/2013 10:00 AM 0.97  0.50 - 1.35 mg/dL Final  05/19/2013  4:35 AM 0.72  0.50 - 1.35 mg/dL Final  05/18/2013  9:42 AM 0.86  0.50 - 1.35 mg/dL Final  03/21/2013  1:50 AM 0.86  0.50 - 1.35 mg/dL Final  03/20/2013  8:15 AM 0.89  0.50 - 1.35 mg/dL Final  12/15/2012  1:41 AM 1.01  0.50 - 1.35 mg/dL Final  12/14/2012 12:00 PM 0.82  0.50 - 1.35 mg/dL Final  09/12/2012  6:11 AM 0.91  0.50 - 1.35 mg/dL Final  09/11/2012  5:06 AM 0.86  0.50 - 1.35 mg/dL Final  09/01/2012  6:05 AM 0.86  0.50 - 1.35 mg/dL Final  08/31/2012  5:45 AM 0.86  0.50 - 1.35 mg/dL Final  08/30/2012  4:41 PM 1.02  0.50 - 1.35 mg/dL Final  08/30/2012  3:16 PM 1.10  0.50 - 1.35 mg/dL Final  09/12/2010  5:15 AM 1.17  0.4 - 1.5 mg/dL Final  09/11/2010  3:23 AM 1.03  0.4 - 1.5 mg/dL Final  09/10/2010  5:34 AM 1.15  0.4 - 1.5 mg/dL Final  09/09/2010  4:19 AM 1.08 DELTA CHECK NOTED  0.4 - 1.5 mg/dL Final  09/08/2010  4:15 AM 0.71  0.4 - 1.5 mg/dL Final  09/07/2010  7:28 PM 0.80  0.4 -  1.5 mg/dL Final  09/07/2010 12:39 PM 0.74  0.4 - 1.5 mg/dL Final  09/07/2010  9:13 AM 0.99  0.4 - 1.5 mg/dL Final     PMHx:   Past Medical History  Diagnosis Date  . Cardiomyopathy     EF55% 11/14<<35%   . Substance abuse     alcohol  . Hypertension   . Atrial fibrillation /flutter     hx of flutter ablation 2/12  . Shortness of breath     "all the time lately" (12/14/2012)  . GERD (gastroesophageal reflux disease)   . Hepatitis     "think I had that once; a long time ago; from dirty needles I think" (12/14/2012)  . 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 Leukocytosis, unspecified 06/12/2013  . 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 Obesity   . Syncope   . Syncope     Past Surgical History  Procedure Laterality Date  . Hemorrhoid surgery  1970's  . Cardiac electrophysiology mapping and ablation  08/2010    [name] 09/07/2010 (12/14/2012)  . Loop recorder implant  08-22-2013    MDT LinQ  implanted by Dr Caryl Comes for syncope    Family Hx:  Family History  Problem Relation Age of Onset  . Diabetes Mother   . Hypertension Mother   . Cirrhosis Father   . Alcohol abuse Father     Social History:  reports that he has been smoking Cigarettes.  He has a 8 pack-year smoking history. He has never used smokeless tobacco. He reports that he drinks alcohol. He reports that he uses illicit drugs ("Crack" cocaine, Cocaine, and Heroin).  Allergies:  Allergies  Allergen Reactions  . Codeine Rash    Medications: Prior to Admission medications   Medication Sig Start Date End Date Taking? Authorizing Provider  albuterol (PROAIR HFA) 108 (90 BASE) MCG/ACT inhaler Inhale into the lungs daily as needed for wheezing or shortness of breath.   Yes Historical Provider, MD  amiodarone (PACERONE) 200 MG tablet Take 1 tablet (200 mg total) by mouth daily. 09/07/13  Yes Cresenciano Genre, MD  Budesonide (PULMICORT FLEXHALER) 90 MCG/ACT inhaler Inhale 1 puff into the lungs every evening.   Yes Historical Provider, MD  carvedilol (COREG) 12.5 MG tablet Take 1 tablet (12.5 mg total) by mouth 2 (two) times daily with a meal. 11/08/13  Yes Cresenciano Genre, MD  furosemide (LASIX) 40 MG tablet Take 0.5 tablets (20 mg total) by mouth daily. 08/14/13  Yes Cresenciano Genre, MD  hydroxyurea (HYDREA) 500 MG capsule Take 500 mg by mouth 3 (three) times daily. May take with food to minimize GI side effects.   Yes Historical Provider, MD  lisinopril (PRINIVIL,ZESTRIL) 5 MG tablet Take 1 tablet (5 mg total) by mouth daily. 09/07/13  Yes Cresenciano Genre, MD  neomycin-bacitracin-polymyxin (NEOSPORIN) OINT Apply 1 application topically daily as needed for wound care.   Yes Historical Provider, MD  warfarin (COUMADIN) 5 MG tablet Take 2.5-5 mg by mouth daily. Take 2.$RemoveBefor'5mg'DsXgMccbhmuc$  on all days including Sundays EXCEPT on Friday take $RemoveBef'5mg'GoQHFRzYqI$ .   Yes Historical Provider, MD    I have reviewed the patient's current medications.  Labs:  Results for  orders placed during the hospital encounter of 01/12/14 (from the past 48 hour(s))  CBC WITH DIFFERENTIAL     Status: Abnormal   Collection Time    01/12/14  9:47 PM      Result Value Ref Range   WBC 14.4 (*) 4.0 - 10.5 K/uL   RBC  3.31 (*) 4.22 - 5.81 MIL/uL   Hemoglobin 14.2  13.0 - 17.0 g/dL   HCT 40.9  39.0 - 52.0 %   MCV 123.6 (*) 78.0 - 100.0 fL   MCH 42.9 (*) 26.0 - 34.0 pg   MCHC 34.7  30.0 - 36.0 g/dL   RDW 13.6  11.5 - 15.5 %   Platelets 226  150 - 400 K/uL   Neutrophils Relative % 82 (*) 43 - 77 %   Lymphocytes Relative 12  12 - 46 %   Monocytes Relative 6  3 - 12 %   Eosinophils Relative 0  0 - 5 %   Basophils Relative 0  0 - 1 %   Neutro Abs 11.8 (*) 1.7 - 7.7 K/uL   Lymphs Abs 1.7  0.7 - 4.0 K/uL   Monocytes Absolute 0.9  0.1 - 1.0 K/uL   Eosinophils Absolute 0.0  0.0 - 0.7 K/uL   Basophils Absolute 0.0  0.0 - 0.1 K/uL  COMPREHENSIVE METABOLIC PANEL     Status: Abnormal   Collection Time    01/12/14  9:47 PM      Result Value Ref Range   Sodium 137  137 - 147 mEq/L   Potassium 4.3  3.7 - 5.3 mEq/L   Chloride 96  96 - 112 mEq/L   CO2 13 (*) 19 - 32 mEq/L   Glucose, Bld 109 (*) 70 - 99 mg/dL   BUN 88 (*) 6 - 23 mg/dL   Creatinine, Ser 11.61 (*) 0.50 - 1.35 mg/dL   Calcium 8.6  8.4 - 10.5 mg/dL   Total Protein 7.9  6.0 - 8.3 g/dL   Albumin 3.7  3.5 - 5.2 g/dL   AST 13  0 - 37 U/L   ALT 8  0 - 53 U/L   Alkaline Phosphatase 94  39 - 117 U/L   Total Bilirubin 0.2 (*) 0.3 - 1.2 mg/dL   GFR calc non Af Amer 4 (*) >90 mL/min   GFR calc Af Amer 5 (*) >90 mL/min   Comment: (NOTE)     The eGFR has been calculated using the CKD EPI equation.     This calculation has not been validated in all clinical situations.     eGFR's persistently <90 mL/min signify possible Chronic Kidney     Disease.  PROTIME-INR     Status: Abnormal   Collection Time    01/12/14  9:48 PM      Result Value Ref Range   Prothrombin Time 49.0 (*) 11.6 - 15.2 seconds   INR 5.78 (*) 0.00 - 1.49    Comment: REPEATED TO VERIFY     CRITICAL RESULT CALLED TO, READ BACK BY AND VERIFIED WITHCaryl Bis RN [id number] GREEN R  PRO B NATRIURETIC PEPTIDE     Status: Abnormal   Collection Time    01/12/14  9:48 PM      Result Value Ref Range   Pro B Natriuretic peptide (BNP) 1110.0 (*) 0 - 125 pg/mL  I-STAT TROPOININ, ED     Status: None   Collection Time    01/12/14  9:57 PM      Result Value Ref Range   Troponin i, poc 0.02  0.00 - 0.08 ng/mL   Comment 3            Comment: Due to the release kinetics of cTnI,     a negative result within the first hours  of the onset of symptoms does not rule out     myocardial infarction with certainty.     If myocardial infarction is still suspected,     repeat the test at appropriate intervals.  I-STAT CG4 LACTIC ACID, ED     Status: None   Collection Time    01/12/14 10:26 PM      Result Value Ref Range   Lactic Acid, Venous 1.36  0.5 - 2.2 mmol/L  I-STAT ARTERIAL BLOOD GAS, ED     Status: Abnormal   Collection Time    01/12/14 11:24 PM      Result Value Ref Range   pH, Arterial 7.162 (*) 7.350 - 7.450   pCO2 arterial 33.5 (*) 35.0 - 45.0 mmHg   pO2, Arterial 101.0 (*) 80.0 - 100.0 mmHg   Bicarbonate 12.0 (*) 20.0 - 24.0 mEq/L   TCO2 13  0 - 100 mmol/L   O2 Saturation 96.0     Acid-base deficit 16.0 (*) 0.0 - 2.0 mmol/L   Patient temperature 98.6 F     Collection site RADIAL, ALLEN'S TEST ACCEPTABLE     Drawn by RT     Sample type ARTERIAL     Comment NOTIFIED PHYSICIAN    BASIC METABOLIC PANEL     Status: Abnormal   Collection Time    01/13/14 12:39 AM      Result Value Ref Range   Sodium 142  137 - 147 mEq/L   Potassium 4.3  3.7 - 5.3 mEq/L   Chloride 105  96 - 112 mEq/L   CO2 14 (*) 19 - 32 mEq/L   Glucose, Bld 111 (*) 70 - 99 mg/dL   BUN 82 (*) 6 - 23 mg/dL   Creatinine, Ser 10.56 (*) 0.50 - 1.35 mg/dL   Calcium 7.3 (*) 8.4 - 10.5 mg/dL   GFR calc non Af Amer 4 (*) >90 mL/min   GFR calc Af Amer 5 (*) >90 mL/min    Comment: (NOTE)     The eGFR has been calculated using the CKD EPI equation.     This calculation has not been validated in all clinical situations.     eGFR's persistently <90 mL/min signify possible Chronic Kidney     Disease.  URINALYSIS, ROUTINE W REFLEX MICROSCOPIC     Status: Abnormal   Collection Time    01/13/14  1:43 AM      Result Value Ref Range   Color, Urine YELLOW  YELLOW   APPearance TURBID (*) CLEAR   Specific Gravity, Urine 1.019  1.005 - 1.030   pH 5.0  5.0 - 8.0   Glucose, UA 100 (*) NEGATIVE mg/dL   Hgb urine dipstick MODERATE (*) NEGATIVE   Bilirubin Urine NEGATIVE  NEGATIVE   Ketones, ur NEGATIVE  NEGATIVE mg/dL   Protein, ur 30 (*) NEGATIVE mg/dL   Urobilinogen, UA 0.2  0.0 - 1.0 mg/dL   Nitrite NEGATIVE  NEGATIVE   Leukocytes, UA NEGATIVE  NEGATIVE  CREATININE, URINE, RANDOM     Status: None   Collection Time    01/13/14  1:43 AM      Result Value Ref Range   Creatinine, Urine 182.73    SODIUM, URINE, RANDOM     Status: None   Collection Time    01/13/14  1:43 AM      Result Value Ref Range   Sodium, Ur 80    PROTEIN, URINE, RANDOM     Status: None   Collection Time  01/13/14  1:43 AM      Result Value Ref Range   Total Protein, Urine 120.7     Comment: NO NORMAL RANGE ESTABLISHED FOR THIS TEST  URINE MICROSCOPIC-ADD ON     Status: Abnormal   Collection Time    01/13/14  1:43 AM      Result Value Ref Range   Squamous Epithelial / LPF RARE  RARE   RBC / HPF 3-6  <3 RBC/hpf   Bacteria, UA MANY (*) RARE   Casts GRANULAR CAST (*) NEGATIVE   Comment: HYALINE CASTS   Crystals CA OXALATE CRYSTALS (*) NEGATIVE   Urine-Other AMORPHOUS URATES/PHOSPHATES    MRSA PCR SCREENING     Status: Abnormal   Collection Time    01/13/14  2:21 AM      Result Value Ref Range   MRSA by PCR POSITIVE (*) NEGATIVE   Comment:            The GeneXpert MRSA Assay (FDA     approved for NASAL specimens     only), is one component of a     comprehensive MRSA  colonization     surveillance program. It is not     intended to diagnose MRSA     infection nor to guide or     monitor treatment for     MRSA infections.     RESULT CALLED TO, READ BACK BY AND VERIFIED WITH:     CALLED TO RN TERESA CRITE [id number] $RemoveBefo'[email or web address]'[email or web address]$  THANEY  GLUCOSE, CAPILLARY     Status: Abnormal   Collection Time    01/13/14  2:27 AM      Result Value Ref Range   Glucose-Capillary 121 (*) 70 - 99 mg/dL   Comment 1 Notify RN     Comment 2 Documented in Chart    CBC     Status: Abnormal   Collection Time    01/13/14  3:13 AM      Result Value Ref Range   WBC 10.2  4.0 - 10.5 K/uL   RBC 2.66 (*) 4.22 - 5.81 MIL/uL   Hemoglobin 11.2 (*) 13.0 - 17.0 g/dL   Comment: REPEATED TO VERIFY   HCT 32.5 (*) 39.0 - 52.0 %   MCV 122.2 (*) 78.0 - 100.0 fL   MCH 42.1 (*) 26.0 - 34.0 pg   MCHC 34.5  30.0 - 36.0 g/dL   RDW 13.5  11.5 - 15.5 %   Platelets 155  150 - 400 K/uL   Comment: REPEATED TO VERIFY  BASIC METABOLIC PANEL     Status: Abnormal   Collection Time    01/13/14  3:13 AM      Result Value Ref Range   Sodium 141  137 - 147 mEq/L   Potassium 4.1  3.7 - 5.3 mEq/L   Chloride 104  96 - 112 mEq/L   CO2 16 (*) 19 - 32 mEq/L   Glucose, Bld 111 (*) 70 - 99 mg/dL   BUN 83 (*) 6 - 23 mg/dL   Creatinine, Ser 10.43 (*) 0.50 - 1.35 mg/dL   Calcium 7.3 (*) 8.4 - 10.5 mg/dL   GFR calc non Af Amer 4 (*) >90 mL/min   GFR calc Af Amer 5 (*) >90 mL/min   Comment: (NOTE)     The eGFR has been calculated using the CKD EPI equation.     This calculation has not been validated in all clinical situations.     eGFR's persistently <90  mL/min signify possible Chronic Kidney     Disease.  PROTIME-INR     Status: Abnormal   Collection Time    01/13/14  3:13 AM      Result Value Ref Range   Prothrombin Time 67.0 (*) 11.6 - 15.2 seconds   Comment: REPEATED TO VERIFY   INR 8.00 (*) 0.00 - 1.49   Comment: REPEATED TO VERIFY     CRITICAL RESULT CALLED TO, READ BACK BY AND VERIFIED WITH:     C.  PORTER RN [id number] GREEN R     ROS:  A comprehensive review of systems was negative except for: Gastrointestinal: positive for diarrhea, nausea and vomiting  Physical Exam: Filed Vitals:   01/13/14 0700  BP:   Pulse:   Temp: 98.7 F (37.1 C)  Resp:      General: fairly alert even with a low blood pressure. He is in no acute distress HEENT: pupils are equal round reactive to light, extra motions are intact, mucous members are moist Neck: there is no jugular venous distention, no carotid bruits or lymphadenopathy Heart: regular rate and rhythm without murmur Lungs: clear to auscultation bilaterally. No O2 requirement Abdomen: soft, nontender, slightly distended. Extremities: absolutely no peripheral edema Skin: warm and dry Neuro: alert and appropriate. The remainder of the neurologic exam is nonfocal  Assessment/Plan: 68 year old male who presents with acute kidney injury in the setting of hypotension on an ACE inhibitor 1.Renal- acute kidney injury as above. Documented renal function 4 months ago was completely normal. The scenario fits with ATN related to extreme hypotension and urinalysis is consistent with that with granular casts. After withholding ACE inhibition and with hydration urine output has picked up and creatinine has improved minimally. He has a Foley catheter in place. I am think a renal ultrasound and is needed at this time as I think it would be low yield. We can just continue with supportive care for now getting blood pressure back up to increase renal perfusion. Hopefully his prognosis for renal recovery is good. There are no absolute indications for dialysis but he is quite acidotic. We'll continue to supplement with IV bicarbonate 2. Hypertension/volume  - seems dry to me and it fits with his recent GI illness. He's receiving IV hydration and blood pressure medications are on hold. Blood pressure is improving. He did not require any pressor therapy 3. Metabolic  acidosis  - quite severe in nature. Lactate not extremely high at 1.36. Currently using IV bicarbonate and level is improving 4. Anemia- is probably unusual for patient given his history of polycythemia. Hydrea is currently on hold. He also had an elevated INR which is likely not helping. This is being corrected. As of now hemoglobin is greater than 11 and no action is needed  Thank you for consultation, we will continue to follow with you.   GOLDSBOROUGH,KELLIE A 01/13/2014, 11:55 AM

## 2014-01-13 NOTE — Progress Notes (Addendum)
PULMONARY / CRITICAL CARE MEDICINE   Name: [name] MRN: [id number] DOB: [birth year]    ADMISSION DATE:  01/12/2014 CONSULTATION DATE:  01/12/2014   REFERRING MD :  Dr. Darl Householder PRIMARY SERVICE: PCCM  CHIEF COMPLAINT:  Hypotension and acute renal failure  BRIEF PATIENT DESCRIPTION: 68 y/o man with polycythemia vera, HTN, COPD who presented with syncope, found to have acute renal failure with Cr of ~12.  SIGNIFICANT EVENTS / STUDIES:  6/26 Head CT - normal 6/27 renal ultrasound > neg hydro, chronic medical renal disease   LINES / TUBES: PIV  CULTURES: none  ANTIBIOTICS: none    SUBJECTIVE: Pt denies acute complaints, no distress. Tolerating PO's.  Improved BP but remains mildly soft.   VITAL SIGNS: Temp:  [97.7 F (36.5 C)-98.5 F (36.9 C)] 97.7 F (36.5 C) (06/27 0415) Pulse Rate:  [66-81] 74 (06/27 0600) Resp:  [9-23] 13 (06/27 0600) BP: (61-95)/(32-58) 95/54 mmHg (06/27 0600) SpO2:  [90 %-100 %] 100 % (06/27 0600) FiO2 (%):  [28 %] 28 % (06/27 0030) Weight:  [177 lb 14.6 oz (80.7 kg)] 177 lb 14.6 oz (80.7 kg) (06/27 0600) HEMODYNAMICS:   VENTILATOR SETTINGS: Vent Mode:  [-]  FiO2 (%):  [28 %] 28 % INTAKE / OUTPUT: Intake/Output     06/26 0701 - 06/27 0700 06/27 0701 - 06/28 0700   I.V. (mL/kg) 350 (4.3)    Total Intake(mL/kg) 350 (4.3)    Urine (mL/kg/hr) 301    Total Output 301     Net +49            PHYSICAL EXAMINATION: General:  Sitting up in bed Neuro:  Alert and oriented x4, normal strength and tone, CNII-VI intact HEENT:  PERRL, normal mucosa Cardiovascular:  Irregularly irregular, no MRG Lungs:  CTAB, no wrr Abdomen:  Soft, non-distended, tol PO's Musculoskeletal:  No clubbing or edema Skin:  No rashes, sores or other lesions.   LABS:   PULMONARY  Recent Labs Lab 01/12/14 2324  PHART 7.162*  PCO2ART 33.5*  PO2ART 101.0*  HCO3 12.0*  TCO2 13  O2SAT 96.0    CBC  Recent Labs Lab 01/12/14 2147 01/13/14 0313  HGB 14.2  11.2*  HCT 40.9 32.5*  WBC 14.4* 10.2  PLT 226 155    COAGULATION  Recent Labs Lab 01/12/14 2148 01/13/14 0313  INR 5.78* 8.00*    CARDIAC  No results found for this basename: TROPONINI,  in the last 168 hours  Recent Labs Lab 01/12/14 2148  PROBNP 1110.0*     CHEMISTRY  Recent Labs Lab 01/12/14 2147 01/13/14 0039 01/13/14 0313  NA 137 142 141  K 4.3 4.3 4.1  CL 96 105 104  CO2 13* 14* 16*  GLUCOSE 109* 111* 111*  BUN 88* 82* 83*  CREATININE 11.61* 10.56* 10.43*  CALCIUM 8.6 7.3* 7.3*   Estimated Creatinine Clearance: 7 ml/min (by C-G formula based on Cr of 10.43).   LIVER  Recent Labs Lab 01/12/14 2147 01/12/14 2148 01/13/14 0313  AST 13  --   --   ALT 8  --   --   ALKPHOS 94  --   --   BILITOT 0.2*  --   --   PROT 7.9  --   --   ALBUMIN 3.7  --   --   INR  --  5.78* 8.00*     INFECTIOUS  Recent Labs Lab 01/12/14 2226  LATICACIDVEN 1.36     ENDOCRINE CBG (last 3)   Recent Labs  01/13/14 0227  GLUCAP 121*         IMAGING x48h  Ct Head Wo Contrast  01/12/2014   CLINICAL DATA:  Dizziness, fall.  EXAM: CT HEAD WITHOUT CONTRAST  TECHNIQUE: Contiguous axial images were obtained from the base of the skull through the vertex without intravenous contrast.  COMPARISON:  CT scan of September 17, 2013.  FINDINGS: Bony calvarium appears intact. Minimal chronic ischemic white matter disease is noted. No mass effect or midline shift is noted. Ventricular size is within normal limits. There is no evidence of mass lesion, hemorrhage or acute infarction.  IMPRESSION: Minimal chronic ischemic white matter disease. No acute intracranial abnormality seen.   Electronically Signed   By: Sabino Dick M.D.   On: 01/12/2014 22:23   US Renal  01/13/2014   CLINICAL DATA:  Acute renal failure  EXAM: RENAL/URINARY TRACT ULTRASOUND COMPLETE  COMPARISON:  None.  FINDINGS: Right Kidney:  Length: 10 cm. There is diffuse cortical thinning with cortical thickness  ranging between 10 and 15 mm. A shadowing echogenic structure is noted in the medullary region of the interpolar kidney, 6 mm in diameter. The renal cortical echogenicity is slightly greater than the neighboring liver. No hydronephrosis.  Left Kidney:  Length: 12 cm. There is diffuse mild cortical thinning, similar to the contralateral kidney. No hydronephrosis. There is increased cortical echogenicity, with abnormal corticomedullary differentiation.  Bladder:  Decompressed around a Foley catheter.  IMPRESSION: 1. Negative for hydronephrosis. There is complete bladder decompression. 2. Chronic medical renal disease.   Electronically Signed   By: Jorje Guild M.D.   On: 01/13/2014 02:18   Dg Chest Portable 1 View  01/12/2014   CLINICAL DATA:  Weakness and hypertension. History of atrial fibrillation  EXAM: PORTABLE CHEST - 1 VIEW  COMPARISON:  08/22/2013  FINDINGS: There is mild limitation related to exclusion of the lateral right costophrenic sulcus.  No cardiomegaly for technique. Stable mediastinal contours. There is an implantable event recorder over the left chest. No consolidation, edema, effusion, or pneumothorax.  IMPRESSION: No active disease.   Electronically Signed   By: Jorje Guild M.D.   On: 01/12/2014 22:51      ASSESSMENT / PLAN:  PULMONARY A: COPD P:   Continue home inhalers O2 to maintain saturations >92%  CARDIOVASCULAR A:  A-fib Hypotension - mild soft BP P:  Hold carvedilol, amiodarone for now PRN lopressor for HR > 115, hold for SBP <90  RENAL A:   Acute renal failure, metabolic acidosis - Most likely secondary to volume depletion from diarrhea and continuing to take lasix in the setting of hydroxyurea and lisinopril.  Baseline cr ~ 0.99 in 08/2013.  Renal US neg.  P:   Bi[employer] infusion SPEP/UPEP pending FeNa and FeUrea pending Urine protein pending Nephrology consulted in ED, pending consult  GASTROINTESTINAL A:  No acute problems P:   Clear liquid  diet  HEMATOLOGIC A:   Polycythemia vera P:  Hold hydroxyurea for now   Noe Gens, NP-C [facility] Pgr: [phone] or [phone] 01/13/2014, 8:39 AM   STAFF NOTE: I, Dr Ann Lions have personally reviewed patient's available data, including medical history, events of note, physical examination and test results as part of my evaluation. I have discussed with resident/NP and other care providers such as pharmacist, RN and RRT.  In addition,  I personally evaluated patient and elicited key findings of acute renal failure - d/w renal: dehydration with meds likely cause. His bp responded to  a fluid bolus with increased ur op. Will give again (noted his ef is 35%) and reassess. Will check abg for acid bases status.  Keep in ICU . Check abg to titrate off bicarb  Rest per NP/medical resident whose note is outlined above and that I agree with  The patient is critically ill with multiple organ systems failure and requires high complexity decision making for assessment and support, frequent evaluation and titration of therapies, application of advanced monitoring technologies and extensive interpretation of multiple databases.   Critical Care Time devoted to patient care services described in this note is 35  Minutes.  Dr. Brand Males, M.D., [facility].C.P Pulmonary and Critical Care Medicine Staff Physician [facility] Pager: [phone], If no answer or between  15:00h - 7:00h: call [phone]  01/13/2014 12:02 PM

## 2014-01-14 LAB — BASIC METABOLIC PANEL
BUN: 68 mg/dL — ABNORMAL HIGH (ref 6–23)
CALCIUM: 7.3 mg/dL — AB (ref 8.4–10.5)
CO2: 24 mEq/L (ref 19–32)
CREATININE: 5.08 mg/dL — AB (ref 0.50–1.35)
Chloride: 102 mEq/L (ref 96–112)
GFR calc Af Amer: 12 mL/min — ABNORMAL LOW (ref >90)
GFR, EST NON AFRICAN AMERICAN: 11 mL/min — AB (ref >90)
Glucose, Bld: 107 mg/dL — ABNORMAL HIGH (ref 70–99)
Potassium: 3 mEq/L — ABNORMAL LOW (ref 3.7–5.3)
SODIUM: 143 meq/L (ref 137–147)

## 2014-01-14 LAB — CBC
HCT: 28.8 % — ABNORMAL LOW (ref 39.0–52.0)
Hemoglobin: 9.9 g/dL — ABNORMAL LOW (ref 13.0–17.0)
MCH: 42.3 pg — ABNORMAL HIGH (ref 26.0–34.0)
MCHC: 34.4 g/dL (ref 30.0–36.0)
MCV: 123.1 fL — ABNORMAL HIGH (ref 78.0–100.0)
Platelets: 135 10*3/uL — ABNORMAL LOW (ref 150–400)
RBC: 2.34 MIL/uL — AB (ref 4.22–5.81)
RDW: 13.7 % (ref 11.5–15.5)
WBC: 5.9 10*3/uL (ref 4.0–10.5)

## 2014-01-14 LAB — URINE CULTURE
COLONY COUNT: NO GROWTH
Culture: NO GROWTH

## 2014-01-14 LAB — PROTIME-INR
INR: 5.21 — AB (ref 0.00–1.49)
PROTHROMBIN TIME: 48.2 s — AB (ref 11.6–15.2)

## 2014-01-14 MED ORDER — ALBUTEROL SULFATE (2.5 MG/3ML) 0.083% IN NEBU
2.5000 mg | INHALATION_SOLUTION | Freq: Four times a day (QID) | RESPIRATORY_TRACT | Status: DC
  Administered 2014-01-14 (×2): 2.5 mg via RESPIRATORY_TRACT
  Filled 2014-01-14 (×2): qty 3

## 2014-01-14 MED ORDER — ALBUTEROL SULFATE (2.5 MG/3ML) 0.083% IN NEBU: 2.5000 mg | INHALATION_SOLUTION | RESPIRATORY_TRACT | Status: DC | PRN

## 2014-01-14 MED ORDER — POTASSIUM CHLORIDE CRYS ER 20 MEQ PO TBCR
40.0000 meq | EXTENDED_RELEASE_TABLET | Freq: Once | ORAL | Status: AC
  Administered 2014-01-14: 40 meq via ORAL
  Filled 2014-01-14: qty 2

## 2014-01-14 NOTE — Progress Notes (Signed)
eLink Physician-Brief Progress Note Patient Name: [name] DOB: June 10, 1946 MRN: [id number]  Date of Service  01/14/2014   HPI/Events of Note   Low K  eICU Interventions  Will give KCL replacement.   Intervention Category Major Interventions: Other:  SOOD,VINEET 01/14/2014, 3:48 AM

## 2014-01-14 NOTE — Progress Notes (Signed)
PULMONARY / CRITICAL CARE MEDICINE   Name: [name] MRN: [id number] DOB: 1946-02-11    ADMISSION DATE:  01/12/2014 CONSULTATION DATE:  01/12/2014   REFERRING MD :  Dr. Darl Householder PRIMARY SERVICE: PCCM  CHIEF COMPLAINT:  Hypotension and acute renal failure  BRIEF PATIENT DESCRIPTION: 68 y/o man with polycythemia vera, HTN, COPD who presented with syncope, found to have acute renal failure with Cr of ~12 in the setting of hypotension, dehydration with ongoing ACE-I use.    SIGNIFICANT EVENTS / STUDIES:  6/26 Head CT - normal 6/27 renal ultrasound > neg hydro, chronic medical renal disease 6/28 Improved sr cr / INR / BP.  Tx out to med floor   LINES / TUBES: PIV  CULTURES: none  ANTIBIOTICS: none    SUBJECTIVE: Pt reports feeling better but remains very weak.  No acute events overnight.   VITAL SIGNS: Temp:  [98 F (36.7 C)-98.9 F (37.2 C)] 98 F (36.7 C) (06/28 0719) Pulse Rate:  [63-90] 66 (06/28 0700) Resp:  [11-21] 17 (06/28 0700) BP: (79-108)/(40-59) 108/53 mmHg (06/28 0700) SpO2:  [94 %-100 %] 100 % (06/28 0700) Weight:  [187 lb 13.3 oz (85.2 kg)] 187 lb 13.3 oz (85.2 kg) (06/28 0500)  INTAKE / OUTPUT: Intake/Output     06/27 0701 - 06/28 0700 06/28 0701 - 06/29 0700   P.O. 480    I.V. (mL/kg) 2821.3 (33.1)    IV Piggyback 1000    Total Intake(mL/kg) 4301.3 (50.5)    Urine (mL/kg/hr) 2465 (1.2)    Total Output 2465     Net +1836.3            PHYSICAL EXAMINATION: General:  wdwn adult male in NAD Neuro:  Alert and oriented x4, normal strength and tone, CNII-VI intact HEENT:  PERRL, normal mucosa Cardiovascular:  Irregularly irregular, no MRG Lungs:  CTAB, no wrr Abdomen:  Soft, non-distended, tol PO's Musculoskeletal:  No clubbing or edema Skin:  No rashes, sores or other lesions.   LABS:  PULMONARY  Recent Labs Lab 01/12/14 2324 01/13/14 1227  PHART 7.162* 7.303*  PCO2ART 33.5* 33.9*  PO2ART 101.0* 75.0*  HCO3 12.0* 16.8*  TCO2 13  18  O2SAT 96.0 93.0   CBC  Recent Labs Lab 01/12/14 2147 01/13/14 0313 01/14/14 0220  HGB 14.2 11.2* 9.9*  HCT 40.9 32.5* 28.8*  WBC 14.4* 10.2 5.9  PLT 226 155 135*   COAGULATION  Recent Labs Lab 01/12/14 2148 01/13/14 0313 01/14/14 0220  INR 5.78* 8.00* 5.21*   CARDIAC  No results found for this basename: TROPONINI,  in the last 168 hours  Recent Labs Lab 01/12/14 2148  PROBNP 1110.0*   CHEMISTRY  Recent Labs Lab 01/12/14 2147 01/13/14 0039 01/13/14 0313 01/14/14 0220  NA 137 142 141 143  K 4.3 4.3 4.1 3.0*  CL 96 105 104 102  CO2 13* 14* 16* 24  GLUCOSE 109* 111* 111* 107*  BUN 88* 82* 83* 68*  CREATININE 11.61* 10.56* 10.43* 5.08*  CALCIUM 8.6 7.3* 7.3* 7.3*   Estimated Creatinine Clearance: 14.6 ml/min (by C-G formula based on Cr of 5.08).  LIVER  Recent Labs Lab 01/12/14 2147 01/12/14 2148 01/13/14 0313 01/14/14 0220  AST 13  --   --   --   ALT 8  --   --   --   ALKPHOS 94  --   --   --   BILITOT 0.2*  --   --   --   PROT 7.9  --   --   --  ALBUMIN 3.7  --   --   --   INR  --  5.78* 8.00* 5.21*   INFECTIOUS  Recent Labs Lab 01/12/14 2226  LATICACIDVEN 1.36   ENDOCRINE CBG (last 3)   Recent Labs  01/13/14 0227  GLUCAP 121*    IMAGING x48h  Ct Head Wo Contrast  01/12/2014   CLINICAL DATA:  Dizziness, fall.  EXAM: CT HEAD WITHOUT CONTRAST  TECHNIQUE: Contiguous axial images were obtained from the base of the skull through the vertex without intravenous contrast.  COMPARISON:  CT scan of September 17, 2013.  FINDINGS: Bony calvarium appears intact. Minimal chronic ischemic white matter disease is noted. No mass effect or midline shift is noted. Ventricular size is within normal limits. There is no evidence of mass lesion, hemorrhage or acute infarction.  IMPRESSION: Minimal chronic ischemic white matter disease. No acute intracranial abnormality seen.   Electronically Signed   By: Sabino Dick M.D.   On: 01/12/2014 22:23   US  Renal  01/13/2014   CLINICAL DATA:  Acute renal failure  EXAM: RENAL/URINARY TRACT ULTRASOUND COMPLETE  COMPARISON:  None.  FINDINGS: Right Kidney:  Length: 10 cm. There is diffuse cortical thinning with cortical thickness ranging between 10 and 15 mm. A shadowing echogenic structure is noted in the medullary region of the interpolar kidney, 6 mm in diameter. The renal cortical echogenicity is slightly greater than the neighboring liver. No hydronephrosis.  Left Kidney:  Length: 12 cm. There is diffuse mild cortical thinning, similar to the contralateral kidney. No hydronephrosis. There is increased cortical echogenicity, with abnormal corticomedullary differentiation.  Bladder:  Decompressed around a Foley catheter.  IMPRESSION: 1. Negative for hydronephrosis. There is complete bladder decompression. 2. Chronic medical renal disease.   Electronically Signed   By: Jorje Guild M.D.   On: 01/13/2014 02:18   Dg Chest Portable 1 View  01/12/2014   CLINICAL DATA:  Weakness and hypertension. History of atrial fibrillation  EXAM: PORTABLE CHEST - 1 VIEW  COMPARISON:  08/22/2013  FINDINGS: There is mild limitation related to exclusion of the lateral right costophrenic sulcus.  No cardiomegaly for technique. Stable mediastinal contours. There is an implantable event recorder over the left chest. No consolidation, edema, effusion, or pneumothorax.  IMPRESSION: No active disease.   Electronically Signed   By: Jorje Guild M.D.   On: 01/12/2014 22:51    ASSESSMENT / PLAN:  RENAL A:   Acute renal failure, metabolic acidosis - Most likely secondary to volume depletion from diarrhea and continuing to take lasix in the setting of hydroxyurea and lisinopril.  Baseline cr ~ 0.99 in 08/2013.  Renal US neg.  Hypokalemia  P:   D/c BiCarb infusion Nephrology following, appreciate input Replace K 6/28  PULMONARY A: COPD - without acute exacerbation  P:   Continue home inhalers PRN albuterol  Wean O2 to  maintain saturations >92%  CARDIOVASCULAR A:  A-fib - on chronic coumadin, INR trending down Hypotension - mild soft BP, resolved.  P:  Hold carvedilol, amiodarone for now PRN lopressor for HR > 115, hold for SBP <90  GASTROINTESTINAL A:  No acute problems P:   Advance diet to regular as tolerated   HEMATOLOGIC A:   Polycythemia vera Anemia Coagulopathy - INR trending down P:  Hold hydroxyurea for now Hold coumadin   GLOBAL: 68 y/o M admitted after multiple syncopal episodes in the setting of profound dehydration, hypotension and ongoing ACE-I usage with resultant acute renal failure.  No GI  sx prior to admit.  Also noted to have supratherapeutic INR (>10) on admission.  Improving renal failure & INR but patient remains very weak.  PT consult pending, hold decisions regarding home med restart until closer to discharge.  Transfer to med tele and to [facility] as of AM 6/29 0700.     Noe Gens, NP-C  Pulmonary & Critical Care Pgr: [phone] or [phone] 01/14/2014, 7:50 AM   sTafff note  - seen in ICU earlier today. He was on wheel chair ready for transfer. Good improvement in renal failure. Rest per NP above. TRH to pick up primary service and pccm off  Dr. Brand Males, M.D., [facility].C.P Pulmonary and Critical Care Medicine Staff Physician [facility] Pager: [phone], If no answer or between  15:00h - 7:00h: call [phone]  01/14/2014 2:35 PM

## 2014-01-14 NOTE — Progress Notes (Signed)
eLink Physician-Brief Progress Note Patient Name: [name] DOB: 1946/04/15 MRN: [id number]  Date of Service  01/14/2014   HPI/Events of Note   Acidosis improving with current therapy.  eICU Interventions  Will d/c order for ABG this AM.   Intervention Category Major Interventions: Other:  SOOD,VINEET 01/14/2014, 4:02 AM

## 2014-01-14 NOTE — Progress Notes (Signed)
Pt arrived to 6E15 via wheelchair. Pt is alert and oriented x4. Independent. No skin breakdown noted. VS stable. Denies any pain. No distress noted. Pt oriented to staff and unit. No family present. Will cont to monitor.

## 2014-01-14 NOTE — Progress Notes (Signed)
Subjective:  No significant events overnight.  Making good urine and creatinine down, bicarb improved- BP is still a little low but better than yesterday Objective Vital signs in last 24 hours: Filed Vitals:   01/14/14 0500 01/14/14 0600 01/14/14 0700 01/14/14 0719  BP: 93/59 97/58 108/53   Pulse: 70 67 66   Temp:    98 F (36.7 C)  TempSrc:    Oral  Resp: 14 15 17    Height:      Weight: 85.2 kg (187 lb 13.3 oz)     SpO2: 100% 100% 100%    Weight change: 4.5 kg (9 lb 14.7 oz)  Intake/Output Summary (Last 24 hours) at 01/14/14 0725 Last data filed at 01/14/14 0600  Gross per 24 hour  Intake 4301.25 ml  Output   2465 ml  Net 1836.25 ml    Assessment/Plan: 68 year old male who presents with acute kidney injury in the setting of hypotension on an ACE inhibitor  1.Renal- acute kidney injury as above. Documented renal function 4 months ago was completely normal. The scenario fits with ATN related to extreme hypotension and urinalysis is consistent with that with granular casts. After withholding ACE inhibition and with hydration urine output has picked up and creatinine has improved now substantially.  I am going to D/C IVF and have told him to drink fluids.  I will also D/C foley catheter.  I anticipate transfer out of ICU and if creatinine improves again tomorrow may be ready for discharge. 2. Hypertension/volume - volume status is better.  I will stop IVF and have him hydrate on his own 3. Metabolic acidosis - quite severe in nature at presentation, now resolved 4. Anemia- is probably unusual for patient given his history of polycythemia. Hydrea is currently on hold. He also had an elevated INR which is likely not helping. This is being corrected.  5. Hypokalemia- being repleted.  Will put him on a regular diet   Cythnia [place] A    Labs: Basic Metabolic Panel:  Recent Labs Lab 01/13/14 0039 01/13/14 0313 01/14/14 0220  NA 142 141 143  K 4.3 4.1 3.0*  CL 105 104 102   CO2 14* 16* 24  GLUCOSE 111* 111* 107*  BUN 82* 83* 68*  CREATININE 10.56* 10.43* 5.08*  CALCIUM 7.3* 7.3* 7.3*   Liver Function Tests:  Recent Labs Lab 01/12/14 2147  AST 13  ALT 8  ALKPHOS 94  BILITOT 0.2*  PROT 7.9  ALBUMIN 3.7   No results found for this basename: LIPASE, AMYLASE,  in the last 168 hours No results found for this basename: AMMONIA,  in the last 168 hours CBC:  Recent Labs Lab 01/12/14 2147 01/13/14 0313 01/14/14 0220  WBC 14.4* 10.2 5.9  NEUTROABS 11.8*  --   --   HGB 14.2 11.2* 9.9*  HCT 40.9 32.5* 28.8*  MCV 123.6* 122.2* 123.1*  PLT 226 155 135*   Cardiac Enzymes: No results found for this basename: CKTOTAL, CKMB, CKMBINDEX, TROPONINI,  in the last 168 hours CBG:  Recent Labs Lab 01/13/14 0227  GLUCAP 121*    Iron Studies: No results found for this basename: IRON, TIBC, TRANSFERRIN, FERRITIN,  in the last 72 hours Studies/Results: Ct Head Wo Contrast  01/12/2014   CLINICAL DATA:  Dizziness, fall.  EXAM: CT HEAD WITHOUT CONTRAST  TECHNIQUE: Contiguous axial images were obtained from the base of the skull through the vertex without intravenous contrast.  COMPARISON:  CT scan of September 17, 2013.  FINDINGS: Bony calvarium  appears intact. Minimal chronic ischemic white matter disease is noted. No mass effect or midline shift is noted. Ventricular size is within normal limits. There is no evidence of mass lesion, hemorrhage or acute infarction.  IMPRESSION: Minimal chronic ischemic white matter disease. No acute intracranial abnormality seen.   Electronically Signed   By: Sabino Dick M.D.   On: 01/12/2014 22:23   US Renal  01/13/2014   CLINICAL DATA:  Acute renal failure  EXAM: RENAL/URINARY TRACT ULTRASOUND COMPLETE  COMPARISON:  None.  FINDINGS: Right Kidney:  Length: 10 cm. There is diffuse cortical thinning with cortical thickness ranging between 10 and 15 mm. A shadowing echogenic structure is noted in the medullary region of the interpolar  kidney, 6 mm in diameter. The renal cortical echogenicity is slightly greater than the neighboring liver. No hydronephrosis.  Left Kidney:  Length: 12 cm. There is diffuse mild cortical thinning, similar to the contralateral kidney. No hydronephrosis. There is increased cortical echogenicity, with abnormal corticomedullary differentiation.  Bladder:  Decompressed around a Foley catheter.  IMPRESSION: 1. Negative for hydronephrosis. There is complete bladder decompression. 2. Chronic medical renal disease.   Electronically Signed   By: Jorje Guild M.D.   On: 01/13/2014 02:18   Dg Chest Portable 1 View  01/12/2014   CLINICAL DATA:  Weakness and hypertension. History of atrial fibrillation  EXAM: PORTABLE CHEST - 1 VIEW  COMPARISON:  08/22/2013  FINDINGS: There is mild limitation related to exclusion of the lateral right costophrenic sulcus.  No cardiomegaly for technique. Stable mediastinal contours. There is an implantable event recorder over the left chest. No consolidation, edema, effusion, or pneumothorax.  IMPRESSION: No active disease.   Electronically Signed   By: Jorje Guild M.D.   On: 01/12/2014 22:51   Medications: Infusions: . dextrose 5 % 1,000 mL with sodium bicarbonate 150 mEq infusion 75 mL/hr at 01/14/14 0033    Scheduled Medications: . albuterol  2.5 mg Inhalation Q4H  . Chlorhexidine Gluconate Cloth  6 each Topical Q0600  . fluticasone  2 puff Inhalation BID  . mupirocin ointment  1 application Nasal BID    have reviewed scheduled and prn medications.  Physical Exam: General: NAD Heart: RRR Lungs: clear Abdomen: soft, non tender Extremities: no edema    01/14/2014,7:25 AM  LOS: 2 days

## 2014-01-15 DIAGNOSIS — N179 Acute kidney failure, unspecified: Secondary | ICD-10-CM | POA: Diagnosis present

## 2014-01-15 LAB — CBC
HEMATOCRIT: 29.5 % — AB (ref 39.0–52.0)
HEMOGLOBIN: 10.3 g/dL — AB (ref 13.0–17.0)
MCH: 42.2 pg — ABNORMAL HIGH (ref 26.0–34.0)
MCHC: 34.9 g/dL (ref 30.0–36.0)
MCV: 120.9 fL — ABNORMAL HIGH (ref 78.0–100.0)
Platelets: 143 10*3/uL — ABNORMAL LOW (ref 150–400)
RBC: 2.44 MIL/uL — ABNORMAL LOW (ref 4.22–5.81)
RDW: 13 % (ref 11.5–15.5)
WBC: 4.9 10*3/uL (ref 4.0–10.5)

## 2014-01-15 LAB — RENAL FUNCTION PANEL
ALBUMIN: 2.7 g/dL — AB (ref 3.5–5.2)
BUN: 40 mg/dL — AB (ref 6–23)
CHLORIDE: 105 meq/L (ref 96–112)
CO2: 28 mEq/L (ref 19–32)
Calcium: 7.6 mg/dL — ABNORMAL LOW (ref 8.4–10.5)
Creatinine, Ser: 1.77 mg/dL — ABNORMAL HIGH (ref 0.50–1.35)
GFR calc non Af Amer: 38 mL/min — ABNORMAL LOW (ref >90)
GFR, EST AFRICAN AMERICAN: 44 mL/min — AB (ref >90)
GLUCOSE: 105 mg/dL — AB (ref 70–99)
PHOSPHORUS: 2.6 mg/dL (ref 2.3–4.6)
POTASSIUM: 3.2 meq/L — AB (ref 3.7–5.3)
Sodium: 145 mEq/L (ref 137–147)

## 2014-01-15 LAB — PROTIME-INR
INR: 2.12 — ABNORMAL HIGH (ref 0.00–1.49)
PROTHROMBIN TIME: 23.7 s — AB (ref 11.6–15.2)

## 2014-01-15 MED ORDER — WARFARIN - PHARMACIST DOSING INPATIENT: Freq: Every day | Status: DC

## 2014-01-15 MED ORDER — WARFARIN SODIUM 2.5 MG PO TABS
2.5000 mg | ORAL_TABLET | Freq: Once | ORAL | Status: AC
  Administered 2014-01-15: 2.5 mg via ORAL
  Filled 2014-01-15: qty 1

## 2014-01-15 NOTE — Progress Notes (Signed)
ANTICOAGULATION CONSULT NOTE - Initial Consult  Pharmacy Consult for coumadin Indication: atrial fibrillation  Allergies  Allergen Reactions  . Codeine Rash    Patient Measurements: Height: 5\' 6"  (167.6 cm) Weight: 186 lb 8.2 oz (84.602 kg) IBW/kg (Calculated) : 63.8  Vital Signs: Temp: 98.3 F (36.8 C) (06/29 0925) Temp src: Oral (06/29 0925) BP: 117/72 mmHg (06/29 0925) Pulse Rate: 72 (06/29 0925)  Labs:  Recent Labs  01/13/14 0313 01/14/14 0220 01/15/14 0413 01/15/14 0659  HGB 11.2* 9.9* 10.3*  --   HCT 32.5* 28.8* 29.5*  --   PLT 155 135* 143*  --   LABPROT 67.0* 48.2*  --  23.7*  INR 8.00* 5.21*  --  2.12*  CREATININE 10.43* 5.08* 1.77*  --     Estimated Creatinine Clearance: 41.9 ml/min (by C-G formula based on Cr of 1.77).   Medical History: Past Medical History  Diagnosis Date  . Cardiomyopathy     EF55% 11/14<<35%   . Substance abuse     alcohol  . Hypertension   . Atrial fibrillation /flutter     hx of flutter ablation 2/12  . Shortness of breath     "all the time lately" (12/14/2012)  . GERD (gastroesophageal reflux disease)   . Hepatitis     "think I had that once; a long time ago; from dirty needles I think" (12/14/2012)  . 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 Leukocytosis, unspecified 06/12/2013  . 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 Obesity   . Syncope   . Syncope     Assessment: Patient is a 68 y.o M on coumadin PTA for Afib.  Coumadin has been on hold since admission due to supra-therapeutic INR-  S/p vitamin K 5mg  PO x1 on 6/27.  INR now down to 2.12 today.  CBC stable, no bleeding documented.  To resume coumadin today per Dr. Aileen Fass.  Goal of Therapy:  INR 2-3    Plan:  1) coumadin 2.5mg  PO x1 today  Jacorey Donaway P 01/15/2014,1:27 PM

## 2014-01-15 NOTE — Evaluation (Signed)
Physical Therapy Evaluation Patient Details Name: [name] MRN: [id number] DOB: 17-May-1946 Today's Date: 01/15/2014   History of Present Illness   68 y/o man with polycythemia vera, HTN, COPD who presented with syncope, found to have acute renal failure with Cr of ~12 in the setting of hypotension, dehydration with ongoing ACE-I use.     Clinical Impression  Patient evaluated by Physical Therapy with no further acute PT needs identified. All education has been completed and the patient has no further questions.  See below for any follow-up Physical Therapy or equipment needs. PT is signing off. Thank you for this referral.     Follow Up Recommendations No PT follow up    Equipment Recommendations  None recommended by PT    Recommendations for Other Services       Precautions / Restrictions Precautions Precaution Comments: contact Restrictions Weight Bearing Restrictions: No      Mobility  Bed Mobility                  Transfers Overall transfer level: Independent Equipment used: None                Ambulation/Gait Ambulation/Gait assistance: Independent Ambulation Distance (Feet): 200 Feet (greater than) Assistive device: None Gait Pattern/deviations: WFL(Within Functional Limits)     General Gait Details: Grossly WFL; no loss of balance noted, and no dizziness/lightheadedness reported  Stairs            Wheelchair Mobility    Modified Rankin (Stroke Patients Only)       Balance Overall balance assessment: No apparent balance deficits (not formally assessed)                                           Pertinent Vitals/Pain no apparent distress     Home Living Family/patient expects to be discharged to:: Private residence Living Arrangements: Spouse/significant other Available Help at Discharge: Family;Available 24 hours/day Type of Home: House Home Access: Stairs to enter Entrance Stairs-Rails:  None Entrance Stairs-Number of Steps: 2 (Reports no problems managing the 2 steps to enter home) Home Layout: One level Home Equipment: Cane - single point      Prior Function Level of Independence: Independent               Hand Dominance        Extremity/Trunk Assessment   Upper Extremity Assessment: Overall WFL for tasks assessed           Lower Extremity Assessment: Overall WFL for tasks assessed         Communication   Communication: No difficulties  Cognition Arousal/Alertness: Awake/alert Behavior During Therapy: WFL for tasks assessed/performed Overall Cognitive Status: Within Functional Limits for tasks assessed                      General Comments      Exercises        Assessment/Plan    PT Assessment Patent does not need any further PT services  PT Diagnosis     PT Problem List    PT Treatment Interventions     PT Goals (Current goals can be found in the Care Plan section) Acute Rehab PT Goals Patient Stated Goal: Would like to go home today PT Goal Formulation: No goals set, d/c therapy    Frequency     Barriers to discharge  Co-evaluation               End of Session   Activity Tolerance: Patient tolerated treatment well Patient left: in chair;with call bell/phone within reach Nurse Communication: Mobility status         Time: 8875-7972 PT Time Calculation (min): 19 min   Charges:   PT Evaluation $Initial PT Evaluation Tier I: 1 Procedure PT Treatments $Gait Training: 8-22 mins   PT G Codes:          [name] Hamff 01/15/2014, 12:12 PM  [name], [name] Pager [phone] Office [phone]

## 2014-01-15 NOTE — Progress Notes (Signed)
Patient ID: [name], male   DOB: 03-23-1946, 68 y.o.   MRN: [id number] S:feels much better O:BP 117/72  Pulse 72  Temp(Src) 98.3 F (36.8 C) (Oral)  Resp 19  Ht _0  (1.676 m)  Wt 84.602 kg (186 lb 8.2 oz)  BMI 30.12 kg/m2  SpO2 98%  Intake/Output Summary (Last 24 hours) at 01/15/14 1229 Last data filed at 01/15/14 0825  Gross per 24 hour  Intake   1440 ml  Output      0 ml  Net   1440 ml   Intake/Output: I/O last 3 completed shifts: In: 2776.3 [P.O.:1680; I.V.:1096.3] Out: 1685 [Urine:1685]  Intake/Output this shift:  Total I/O In: 240 [P.O.:240] Out: -  Weight change: -0.6 kg (-1 lb 5.2 oz) Gen:WD WN HM in NAD CVS:no rub Resp:cta IPJ:ASNKNL Ext:tr edema   Recent Labs Lab 01/12/14 2147 01/13/14 0039 01/13/14 0313 01/14/14 0220 01/15/14 0413  NA 137 142 141 143 145  K 4.3 4.3 4.1 3.0* 3.2*  CL 96 105 104 102 105  CO2 13* 14* 16* 24 28  GLUCOSE 109* 111* 111* 107* 105*  BUN 88* 82* 83* 68* 40*  CREATININE 11.61* 10.56* 10.43* 5.08* 1.77*  ALBUMIN 3.7  --   --   --  2.7*  CALCIUM 8.6 7.3* 7.3* 7.3* 7.6*  PHOS  --   --   --   --  2.6  AST 13  --   --   --   --   ALT 8  --   --   --   --    Liver Function Tests:  Recent Labs Lab 01/12/14 2147 01/15/14 0413  AST 13  --   ALT 8  --   ALKPHOS 94  --   BILITOT 0.2*  --   PROT 7.9  --   ALBUMIN 3.7 2.7*   No results found for this basename: LIPASE, AMYLASE,  in the last 168 hours No results found for this basename: AMMONIA,  in the last 168 hours CBC:  Recent Labs Lab 01/12/14 2147 01/13/14 0313 01/14/14 0220 01/15/14 0413  WBC 14.4* 10.2 5.9 4.9  NEUTROABS 11.8*  --   --   --   HGB 14.2 11.2* 9.9* 10.3*  HCT 40.9 32.5* 28.8* 29.5*  MCV 123.6* 122.2* 123.1* 120.9*  PLT 226 155 135* 143*   Cardiac Enzymes: No results found for this basename: CKTOTAL, CKMB, CKMBINDEX, TROPONINI,  in the last 168 hours CBG:  Recent Labs Lab 01/13/14 0227  GLUCAP 121*    Iron Studies: No  results found for this basename: IRON, TIBC, TRANSFERRIN, FERRITIN,  in the last 72 hours Studies/Results: No results found. . Chlorhexidine Gluconate Cloth  6 each Topical Q0600  . fluticasone  2 puff Inhalation BID  . mupirocin ointment  1 application Nasal BID    BMET    Component Value Date/Time   NA 145 01/15/2014 0413   NA 143 06/12/2013 1125   K 3.2* 01/15/2014 0413   K 3.7 06/12/2013 1125   CL 105 01/15/2014 0413   CO2 28 01/15/2014 0413   CO2 32* 06/12/2013 1125   GLUCOSE 105* 01/15/2014 0413   GLUCOSE 92 06/12/2013 1125   BUN 40* 01/15/2014 0413   BUN 14.6 06/12/2013 1125   CREATININE 1.77* 01/15/2014 0413   CREATININE 0.9 06/12/2013 1125   CREATININE 0.94 03/24/2013 1409   CALCIUM 7.6* 01/15/2014 0413   CALCIUM 8.9 06/12/2013 1125   GFRNONAA 38* 01/15/2014 0413   GFRNONAA 85  03/24/2013 1409   GFRAA 44* 01/15/2014 0413   GFRAA >89 03/24/2013 1409   CBC    Component Value Date/Time   WBC 4.9 01/15/2014 0413   WBC 5.8 10/24/2013 0840   RBC 2.44* 01/15/2014 0413   RBC 4.51 10/24/2013 0840   HGB 10.3* 01/15/2014 0413   HGB 16.2 10/24/2013 0840   HCT 29.5* 01/15/2014 0413   HCT 48.1 10/24/2013 0840   PLT 143* 01/15/2014 0413   PLT 143 10/24/2013 0840   MCV 120.9* 01/15/2014 0413   MCV 106.7* 10/24/2013 0840   MCH 42.2* 01/15/2014 0413   MCH 36.0* 10/24/2013 0840   MCHC 34.9 01/15/2014 0413   MCHC 33.8 10/24/2013 0840   RDW 13.0 01/15/2014 0413   RDW 24.1* 10/24/2013 0840   LYMPHSABS 1.7 01/12/2014 2147   LYMPHSABS 0.8* 10/24/2013 0840   MONOABS 0.9 01/12/2014 2147   MONOABS 0.7 10/24/2013 0840   EOSABS 0.0 01/12/2014 2147   EOSABS 0.1 10/24/2013 0840   BASOSABS 0.0 01/12/2014 2147   BASOSABS 0.0 10/24/2013 0840    Assessment/Plan:  1. AKI: in setting of hypotension and ACE inhibitor most c/w ischemic ATN now with significant increase in UOP and improvement of Scr.  Will sign off as we have nothing further to add.  F/u with PCP who can refer him to Dr. Moshe Cipro for follow-up if his renal function  does not return to baseline. 2. HTN- stable 3. Met acidosis- resolved 4. Anemia- w/u per primary svc as he has a h/o polycythemia. 5. Hypokalemia- cont to replete. 6. Please call with any questions or concerns.  [place] A

## 2014-01-15 NOTE — Progress Notes (Signed)
Internal medicine Residency Transfer Note-   68 Y O male with PMH of polycythemia vera - positive Jak2 mutation, on hydroxyurea, HTN, on lisinopril and lasix, cardiomyopathy with an ejection fraction of 35% and COPD on budesonide, a-fib on carvedilol, amiodarone, warfarin who presented 01/13/2014 with syncope and was found to be hypotensive with AKI with Cr- 11.6 - with prior hx of severe diarrhea and lasix use- pt was given 4L of Ns and was still hypotensive and so PCCM was consulted and pt was admitted to the [facility]. Head Ct was done, showed only minimal chronic ischemic white matter disease, no infarcts or hrr. On admission- Cr-  11.6 marked elevation from baseline Cr- <1, BUN- 66, bicarb- 13. Pt was hydrated in the ICU, lasix, hydroxyurea and lisinopril were a held, and was given Bicarb infusion. Nephrology was consulted. Impression by nephrology was of ATN related to extreme hypotension and urinalysis consistent with that with granular casts. INR was also increased at 8.00, Vit k - 74m was given. INR today- 2.12  Objective: Vital signs in last 24 hours: Filed Vitals:   01/15/14 0502 01/15/14 0821 01/15/14 0925 01/15/14 1815  BP: 143/66  117/72 135/74  Pulse: 68  72 66  Temp: 98.5 F (36.9 C)  98.3 F (36.8 C) 98.8 F (37.1 C)  TempSrc: Oral  Oral Oral  Resp: _0 Height:      Weight:      SpO2: 99% 99% 98% 97%   Weight change: -1 lb 5.2 oz (-0.6 kg)  Intake/Output Summary (Last 24 hours) at 01/15/14 1947 Last data filed at 01/15/14 1700  Gross per 24 hour  Intake   1200 ml  Output      0 ml  Net   1200 ml   General appearance: alert, cooperative, appears stated age and no distress Head: Normocephalic, without obvious abnormality, atraumatic Lungs: clear to auscultation bilaterally, no wheezes or crackles. Heart: Irregular rate and rhythm, S1, S2 normal, no murmur, click, rub or gallop Extremities: extremities normal, atraumatic, no cyanosis or  edema Pulses: 2+ and symmetric Neuro- Alert and oriented X3, normal strength in all extremities.  Lab Results: Basic Metabolic Panel:  Recent Labs Lab 01/14/14 0220 01/15/14 0413  NA 143 145  K 3.0* 3.2*  CL 102 105  CO2 24 28  GLUCOSE 107* 105*  BUN 68* 40*  CREATININE 5.08* 1.77*  CALCIUM 7.3* 7.6*  PHOS  --  2.6   Liver Function Tests:  Recent Labs Lab 01/12/14 2147 01/15/14 0413  AST 13  --   ALT 8  --   ALKPHOS 94  --   BILITOT 0.2*  --   PROT 7.9  --   ALBUMIN 3.7 2.7*   CBC:  Recent Labs Lab 01/12/14 2147  01/14/14 0220 01/15/14 0413  WBC 14.4*  < > 5.9 4.9  NEUTROABS 11.8*  --   --   --   HGB 14.2  < > 9.9* 10.3*  HCT 40.9  < > 28.8* 29.5*  MCV 123.6*  < > 123.1* 120.9*  PLT 226  < > 135* 143*  < > = values in this interval not displayed.  BNP:  Recent Labs Lab 01/12/14 2148  PROBNP 1110.0*   CBG:  Recent Labs Lab 01/13/14 0227  GLUCAP 121*   Coagulation:  Recent Labs Lab 01/12/14 2148 01/13/14 0313  01/14/14 0220 01/15/14 0659  LABPROT 49.0* 67.0* 48.2* 23.7*  INR 5.78* 8.00* 5.21* 2.12*     Urinalysis:  Recent Labs Lab 01/13/14 0143  COLORURINE YELLOW  LABSPEC 1.019  PHURINE 5.0  GLUCOSEU 100*  HGBUR MODERATE*  BILIRUBINUR NEGATIVE  KETONESUR NEGATIVE  PROTEINUR 30*  UROBILINOGEN 0.2  NITRITE NEGATIVE  LEUKOCYTESUR NEGATIVE   Micro Results: Recent Results (from the past 240 hour(s))  URINE CULTURE     Status: None   Collection Time    01/13/14  1:43 AM      Result Value Ref Range Status   Specimen Description URINE, CATHETERIZED   Final   Special Requests NONE   Final   Culture  Setup Time     Final   Value: 01/13/2014 15:34     Performed at [facility]     Final   Value: NO GROWTH     Performed at [facility]   Culture     Final   Value: NO GROWTH     Performed at [facility]   Report Status 01/14/2014 FINAL   Final  MRSA PCR SCREENING     Status: Abnormal    Collection Time    01/13/14  2:21 AM      Result Value Ref Range Status   MRSA by PCR POSITIVE (*) NEGATIVE Final   Comment:            The GeneXpert MRSA Assay (FDA     approved for NASAL specimens     only), is one component of a     comprehensive MRSA colonization     surveillance program. It is not     intended to diagnose MRSA     infection nor to guide or     monitor treatment for     MRSA infections.     RESULT CALLED TO, READ BACK BY AND VERIFIED WITH:     CALLED TO RN TERESA CRITE [id number] _0  THANEY   Studies/Results: No results found. Medications: I have reviewed the patient's current medications. Scheduled Meds: . Chlorhexidine Gluconate Cloth  6 each Topical Q0600  . fluticasone  2 puff Inhalation BID  . mupirocin ointment  1 application Nasal BID  . Warfarin - Pharmacist Dosing Inpatient   Does not apply q1800   Continuous Infusions:  PRN Meds:.sodium chloride, albuterol, metoprolol Assessment/Plan:  Hypotension with AKI- Hypotension likely cause of syncope, Likely multifactotrial- dehydration form severe diarrhea, ongoing lasix use, hydroxyurea, and lisinopril. Cr- down from 11.61 on admission to 1.77 today. Protein electrophoresis still in process - Hold home lisinopril and lasix - Bmet in the am - Likely discharge home tomorrow, with close follow up with PCP - Nephrology recs appreciated, has signed off, pt to F/u with PCP who can refer him to Dr. Moshe Cipro for follow-up if his renal function does not return to baseline.  Atria Fib- Rate controlled without meds. Rate- 51- 83 . Home meds- Coreg- 12.56m BID, and amiodarone- 2089mdaily. On coumadin at home but with supratherapeutic INR, now resolved. Home warfarin resumed- 2.89m3maily except on Friday- 89mg29m Warfarin per pharmacy. - PRN Metoprolol 2.5 - 89mg 76m  Polycythemia vera- Baseline- ~17. Has been low throughout admission in the setting of elevated INR. CBC- 10.3 today. No identified source of  bleeding - CBC in the Am.  COPD- stable, home meds- Albuterol and budesonide inhaler. - Continue on admission.  HTN- Home meds- Lisinopril- 89mg d59my, Coreg-  12.75m, Lasix 464mdaily. - Meds held in th esetting of AKI and hypotension., can resume on follow up. Bp within acceptable range for now.  FEN- Regular diet.   Dispo: Disposition is deferred at this time, awaiting improvement of current medical problems.    The patient does have a current PCP (TCresenciano GenreMD) and [facility].  The patient does not know have transportation limitations that hinder transportation to clinic appointments.  .Services Needed at time of discharge: Y = Yes, Blank = No PT:   OT:   RN:   Equipment:   Other:     LOS: 3 days   EjJenetta DownerMD 01/15/2014, 7:47 PM

## 2014-01-16 DIAGNOSIS — I10 Essential (primary) hypertension: Secondary | ICD-10-CM

## 2014-01-16 DIAGNOSIS — K922 Gastrointestinal hemorrhage, unspecified: Secondary | ICD-10-CM

## 2014-01-16 DIAGNOSIS — N179 Acute kidney failure, unspecified: Secondary | ICD-10-CM

## 2014-01-16 DIAGNOSIS — I959 Hypotension, unspecified: Secondary | ICD-10-CM

## 2014-01-16 DIAGNOSIS — I4891 Unspecified atrial fibrillation: Secondary | ICD-10-CM

## 2014-01-16 DIAGNOSIS — J449 Chronic obstructive pulmonary disease, unspecified: Secondary | ICD-10-CM

## 2014-01-16 DIAGNOSIS — D45 Polycythemia vera: Secondary | ICD-10-CM

## 2014-01-16 LAB — CBC
HEMATOCRIT: 31.4 % — AB (ref 39.0–52.0)
Hemoglobin: 11 g/dL — ABNORMAL LOW (ref 13.0–17.0)
MCH: 43 pg — AB (ref 26.0–34.0)
MCHC: 35 g/dL (ref 30.0–36.0)
MCV: 122.7 fL — AB (ref 78.0–100.0)
PLATELETS: 158 10*3/uL (ref 150–400)
RBC: 2.56 MIL/uL — ABNORMAL LOW (ref 4.22–5.81)
RDW: 13.1 % (ref 11.5–15.5)
WBC: 4.7 10*3/uL (ref 4.0–10.5)

## 2014-01-16 LAB — BASIC METABOLIC PANEL
BUN: 22 mg/dL (ref 6–23)
CO2: 28 mEq/L (ref 19–32)
Calcium: 7.9 mg/dL — ABNORMAL LOW (ref 8.4–10.5)
Chloride: 103 mEq/L (ref 96–112)
Creatinine, Ser: 1.01 mg/dL (ref 0.50–1.35)
GFR calc Af Amer: 85 mL/min — ABNORMAL LOW (ref >90)
GFR calc non Af Amer: 74 mL/min — ABNORMAL LOW (ref >90)
Glucose, Bld: 100 mg/dL — ABNORMAL HIGH (ref 70–99)
Potassium: 3.2 mEq/L — ABNORMAL LOW (ref 3.7–5.3)
Sodium: 143 mEq/L (ref 137–147)

## 2014-01-16 LAB — PROTIME-INR
INR: 1.77 — ABNORMAL HIGH (ref 0.00–1.49)
Prothrombin Time: 20.6 seconds — ABNORMAL HIGH (ref 11.6–15.2)

## 2014-01-16 LAB — UIFE/LIGHT CHAINS/TP QN, 24-HR UR
ALPHA 1 UR: DETECTED — AB
ALPHA 2 UR: DETECTED — AB
Albumin, U: DETECTED
BETA UR: DETECTED — AB
Free Kappa Lt Chains,Ur: 38.6 mg/dL — ABNORMAL HIGH (ref 0.14–2.42)
Free Kappa/Lambda Ratio: 3.04 ratio (ref 2.04–10.37)
Free Lambda Lt Chains,Ur: 12.7 mg/dL — ABNORMAL HIGH (ref 0.02–0.67)
Gamma Globulin, Urine: DETECTED — AB
Total Protein, Urine: 63.4 mg/dL

## 2014-01-16 LAB — PROTEIN ELECTROPHORESIS, SERUM
ALPHA-2-GLOBULIN: 10.1 % (ref 7.1–11.8)
Albumin ELP: 53.1 % — ABNORMAL LOW (ref 55.8–66.1)
Alpha-1-Globulin: 4.9 % (ref 2.9–4.9)
BETA 2: 6.8 % — AB (ref 3.2–6.5)
BETA GLOBULIN: 5.9 % (ref 4.7–7.2)
GAMMA GLOBULIN: 19.2 % — AB (ref 11.1–18.8)
M-SPIKE, %: NOT DETECTED g/dL
Total Protein ELP: 5.5 g/dL — ABNORMAL LOW (ref 6.0–8.3)

## 2014-01-16 LAB — OCCULT BLOOD X 1 CARD TO LAB, STOOL: FECAL OCCULT BLD: POSITIVE — AB

## 2014-01-16 MED ORDER — WARFARIN SODIUM 2.5 MG PO TABS: 2.5000 mg | ORAL_TABLET | Freq: Once | ORAL | Status: DC

## 2014-01-16 MED ORDER — WARFARIN SODIUM 2.5 MG PO TABS
2.5000 mg | ORAL_TABLET | Freq: Once | ORAL | Status: AC
  Administered 2014-01-16: 2.5 mg via ORAL
  Filled 2014-01-16: qty 1

## 2014-01-16 MED ORDER — POTASSIUM CHLORIDE CRYS ER 20 MEQ PO TBCR
40.0000 meq | EXTENDED_RELEASE_TABLET | Freq: Once | ORAL | Status: AC
  Administered 2014-01-16: 40 meq via ORAL
  Filled 2014-01-16: qty 2

## 2014-01-16 NOTE — Progress Notes (Signed)
Subjective: Pt says he had some blood bowel movement yesterday and today. No dizziness. Denies hemorrhoids. previous episodes. Pt says he had a colonoscopy 2 years ago. Does not remember the results or when he was told to repeat it. Pt says his abdomen generally feels funny, no pain.    Objective: Vital signs in last 24 hours: Filed Vitals:   01/16/14 0459 01/16/14 0617 01/16/14 1000 01/16/14 1716  BP: 132/75  113/71 160/73  Pulse: 56  60 55  Temp: 99.1 F (37.3 C)  99.1 F (37.3 C) 98.9 F (37.2 C)  TempSrc: Oral  Oral Oral  Resp: 18  18 16   Height:      Weight:  165 lb 2 oz (74.9 kg)    SpO2: 94%  96% 98%   Weight change: -21 lb 6.2 oz (-9.7 kg)  Intake/Output Summary (Last 24 hours) at 01/16/14 1811 Last data filed at 01/16/14 1300  Gross per 24 hour  Intake    840 ml  Output      0 ml  Net    840 ml   General appearance: alert, cooperative, appears stated age and no distress Lungs: clear to auscultation bilaterally, no wheezes or crackles. Heart: regular rate and rhythm, S1, S2 normal, no murmur, click, rub or gallop Abdomen: soft, non-tender; bowel sounds normal; no masses,  no organomegaly, rectal exam, normal perianal skin, no external hemorrhoids, no external appreciable fissure, rectal vault empty, gloved finger stained with minimal stool. FOBT- Positive. Extremities: extremities normal, atraumatic, no cyanosis or edema Pulses: 2+ and symmetric Neurologic: Grossly normal.  Lab Results: Basic Metabolic Panel:  Recent Labs Lab 01/15/14 0413 01/16/14 0525  NA 145 143  K 3.2* 3.2*  CL 105 103  CO2 28 28  GLUCOSE 105* 100*  BUN 40* 22  CREATININE 1.77* 1.01  CALCIUM 7.6* 7.9*  PHOS 2.6  --    Liver Function Tests:  Recent Labs Lab 01/12/14 2147 01/15/14 0413  AST 13  --   ALT 8  --   ALKPHOS 94  --   BILITOT 0.2*  --   PROT 7.9  --   ALBUMIN 3.7 2.7*   CBC:  Recent Labs Lab 01/12/14 2147  01/15/14 0413 01/16/14 0525  WBC 14.4*  < > 4.9  4.7  NEUTROABS 11.8*  --   --   --   HGB 14.2  < > 10.3* 11.0*  HCT 40.9  < > 29.5* 31.4*  MCV 123.6*  < > 120.9* 122.7*  PLT 226  < > 143* 158  < > = values in this interval not displayed.  BNP:  Recent Labs Lab 01/12/14 2148  PROBNP 1110.0*   CBG:  Recent Labs Lab 01/13/14 0227  GLUCAP 121*   Coagulation:  Recent Labs Lab 01/13/14 0313 01/14/14 0220 01/15/14 0659 01/16/14 0525  LABPROT 67.0* 48.2* 23.7* 20.6*  INR 8.00* 5.21* 2.12* 1.77*   Urine Drug Screen: Drugs of Abuse     Component Value Date/Time   LABOPIA POSITIVE* 08/22/2013 1744   COCAINSCRNUR NONE DETECTED 08/22/2013 1744   LABBENZ NONE DETECTED 08/22/2013 1744   AMPHETMU NONE DETECTED 08/22/2013 1744   THCU NONE DETECTED 08/22/2013 1744   LABBARB NONE DETECTED 08/22/2013 1744    Urinalysis:  Recent Labs Lab 01/13/14 0143  COLORURINE YELLOW  LABSPEC 1.019  PHURINE 5.0  GLUCOSEU 100*  HGBUR MODERATE*  BILIRUBINUR NEGATIVE  KETONESUR NEGATIVE  PROTEINUR 30*  UROBILINOGEN 0.2  NITRITE NEGATIVE  LEUKOCYTESUR NEGATIVE   Micro Results:  Recent Results (from the past 240 hour(s))  URINE CULTURE     Status: None   Collection Time    01/13/14  1:43 AM      Result Value Ref Range Status   Specimen Description URINE, CATHETERIZED   Final   Special Requests NONE   Final   Culture  Setup Time     Final   Value: 01/13/2014 15:34     Performed at [facility]     Final   Value: NO GROWTH     Performed at [facility]   Culture     Final   Value: NO GROWTH     Performed at [facility]   Report Status 01/14/2014 FINAL   Final  MRSA PCR SCREENING     Status: Abnormal   Collection Time    01/13/14  2:21 AM      Result Value Ref Range Status   MRSA by PCR POSITIVE (*) NEGATIVE Final   Comment:            The GeneXpert MRSA Assay (FDA     approved for NASAL specimens     only), is one component of a     comprehensive MRSA colonization     surveillance program.  It is not     intended to diagnose MRSA     infection nor to guide or     monitor treatment for     MRSA infections.     RESULT CALLED TO, READ BACK BY AND VERIFIED WITH:     CALLED TO RN Helene Kelp CRITE [id number] @[id number]  THANEY   Medications: I have reviewed the patient's current medications. Scheduled Meds: . Chlorhexidine Gluconate Cloth  6 each Topical Q0600  . fluticasone  2 puff Inhalation BID  . mupirocin ointment  1 application Nasal BID  . Warfarin - Pharmacist Dosing Inpatient   Does not apply q1800   Continuous Infusions:  PRN Meds:.sodium chloride, albuterol Assessment/Plan: Principal Problem:   Atrial fibrillation with RVR Active Problems:   Hypovolemic shock   AKI (acute kidney injury)  Hypotension with AKI- Hypotension likely cause of syncope, Likely multifactotrial- dehydration from severe diarrhea, ongoing lasix use, and lisinopril. Cr- down from 11.61 on admission to 1.01 today.  - Hold home lisinopril and lasix  - Discharge home today, with close follow up with PCP   Atria Fib- Rate controlled without meds. Rate- 51- 83 . Home meds- Coreg- 12.5mg  BID, and amiodarone- 200mg  daily. On coumadin at home but with supratherapeutic INR, now resolved. Home warfarin resumed- 2.5mg  daily except on Friday- 5mg . Amiodarone held on admission, can be restarted on follow up in 2 days.  - Warfarin per pharmacy, Pharm recs can discharge home with 2.5mg  daily, for INR check in 2 days.  - PRN Metoprolol 2.5 - 5mg  Q3H   GI Bleed- CBC stable- 11.0, appears stable. INR- 1.77 today. Close follow up with PCP. Pt says he had a colonoscopy done 2 years ago in [place]. Will need records. Possible diverticulosis and GI refferal pending results.  Polycythemia vera- JAK-2 Mutation postive but biopsy negative. Baseline- ~17. Has been low throughout admission in the setting of elevated INR. CBC- 11.0 today. Pt follows with Dr Addison Lank kristin. HydroxyUrea continued on discharge.  COPD- stable, home  meds- Albuterol and budesonide inhaler.  - Continue on admission.   HTN- Home meds- Lisinopril- 5mg  daily, Coreg- 12.5mg , Lasix 40mg  daily.  - Meds held in th esetting of  AKI and hypotension., can resume on follow up. Bp within acceptable range for now.   FEN- Regular diet.   Dispo: Disposition is deferred at this time, awaiting improvement of current medical problems.    The patient does have a current PCP Cresenciano Genre, MD) and does need an [facility] hospital follow-up appointment after discharge.  The patient does not know have transportation limitations that hinder transportation to clinic appointments.  .Services Needed at time of discharge: Y = Yes, Blank = No PT:   OT:   RN:   Equipment:   Other:     LOS: 4 days   Jenetta Downer, MD 01/16/2014, 6:11 PM

## 2014-01-16 NOTE — Progress Notes (Signed)
ANTICOAGULATION CONSULT NOTE - Follow Up Consult  Pharmacy Consult for Coumadin Indication: atrial fibrillation  Allergies  Allergen Reactions  . Codeine Rash    Patient Measurements: Height: 5\' 6"  (167.6 cm) Weight: 165 lb 2 oz (74.9 kg) IBW/kg (Calculated) : 63.8  Vital Signs: Temp: 99.1 F (37.3 C) (06/30 1000) Temp src: Oral (06/30 1000) BP: 113/71 mmHg (06/30 1000) Pulse Rate: 60 (06/30 1000)  Labs:  Recent Labs  01/14/14 0220 01/15/14 0413 01/15/14 0659 01/16/14 0525  HGB 9.9* 10.3*  --  11.0*  HCT 28.8* 29.5*  --  31.4*  PLT 135* 143*  --  158  LABPROT 48.2*  --  23.7* 20.6*  INR 5.21*  --  2.12* 1.77*  CREATININE 5.08* 1.77*  --  1.01    Estimated Creatinine Clearance: 64.9 ml/min (by C-G formula based on Cr of 1.01).  Assessment:   Home Coumadin regimen for afib: 2.5 mg daily, except 5 mg on Fridays, but INR 5.78 on admit 6/26.   INR up to 8.0 on 6/27, Vitamin K 5 mg PO given.  Coumadin resumed yesterday when INR down to 2.12.  INR has now dropped to 1.77, subtherapeutic. Most home meds on hold, including Amiodarone.  Goal of Therapy:  INR 2-3 Monitor platelets by anticoagulation protocol: Yes   Plan:   Repeat Coumadin 2.5 mg today.  Daily PT/INR.  Arty Baumgartner, [place] Pager: [phone] 01/16/2014,3:01 PM

## 2014-01-16 NOTE — Discharge Summary (Signed)
Name: [name] MRN: [id number] DOB: 10-Dec-1945 68 y.o. PCP: Cresenciano Genre, MD  Date of Admission: 01/12/2014  9:38 PM Date of Discharge: 01/16/2014 Attending Physician: [name], MD  Discharge Diagnosis:  Principal Problem:   Atrial fibrillation with RVR Active Problems:   Hypovolemic shock   AKI (acute kidney injury)  Discharge Medications:   Medication List    STOP taking these medications       amiodarone 200 MG tablet  Commonly known as:  PACERONE     carvedilol 12.5 MG tablet  Commonly known as:  COREG     furosemide 40 MG tablet  Commonly known as:  LASIX     lisinopril 5 MG tablet  Commonly known as:  PRINIVIL,ZESTRIL      TAKE these medications       hydroxyurea 500 MG capsule  Commonly known as:  HYDREA  Take 500 mg by mouth 3 (three) times daily. May take with food to minimize GI side effects.     neomycin-bacitracin-polymyxin Oint  Commonly known as:  NEOSPORIN  Apply 1 application topically daily as needed for wound care.     PROAIR HFA 108 (90 BASE) MCG/ACT inhaler  Generic drug:  albuterol  Inhale into the lungs daily as needed for wheezing or shortness of breath.     PULMICORT FLEXHALER 90 MCG/ACT inhaler  Generic drug:  Budesonide  Inhale 1 puff into the lungs every evening.     warfarin 2.5 MG tablet  Commonly known as:  COUMADIN  Take 1 tablet (2.5 mg total) by mouth one time only at 6 PM.        Disposition and follow-up:   [name] was discharged from [facility] in Good condition.  At the hospital follow up visit please address:  1. Meds- Amiodarone- 27m daily, Coreg 12.5 mg daily, Lasix 468mdaily, lisinopril- 48m33maily all held on discharge as BP  WNL throughout and pulse on the bradycardic side. These meds could be gradually restarted on discharge. Patient having bloody bowel movements since discharge? Pt might be able to provide better information about where he did his  colonoscopy so that records can be obtained.  2.  Labs / imaging needed at time of follow-up: BMET- Check if Cr remains normal. INR check, and warfarin dose adjustment. CBC- Hgb stable?   3.  Pending labs/ test needing follow-up: None  Follow-up Appointments:     Follow-up Information   Follow up with PatCharlott RakesD On 01/18/2014. (_0 )    Specialty:  Internal Medicine   Contact information:   [phone]    Discharge Instructions: Discharge Instructions   Diet - low sodium heart healthy    Complete by:  As directed      Discharge instructions    Complete by:  As directed   Please follow up with us [place] 2 days to check your INR and to ensure you are no longer bleeding.  We have stopped some of your medications for now, these can be restarted when you follow up with us [place] clinic. We have stopped your lisinopril, Lasix, coreg, and Amidarone. These will be restarted gradaully on followup in clinic.  We have changed your Warfarin to 2.48mg68mily, this will be adjusted when you come in to clinic.     Increase activity slowly    Complete by:  As directed  Consultations:  PCCM  Procedures Performed:  Ct Head Wo Contrast  01/12/2014   CLINICAL DATA:  Dizziness, fall.  EXAM: CT HEAD WITHOUT CONTRAST  TECHNIQUE: Contiguous axial images were obtained from the base of the skull through the vertex without intravenous contrast.  COMPARISON:  CT scan of September 17, 2013.  FINDINGS: Bony calvarium appears intact. Minimal chronic ischemic white matter disease is noted. No mass effect or midline shift is noted. Ventricular size is within normal limits. There is no evidence of mass lesion, hemorrhage or acute infarction.  IMPRESSION: Minimal chronic ischemic white matter disease. No acute intracranial abnormality seen.   Electronically Signed   By: Sabino Dick M.D.   On: 01/12/2014 22:23   US Renal  01/13/2014   CLINICAL DATA:  Acute renal  failure  EXAM: RENAL/URINARY TRACT ULTRASOUND COMPLETE  COMPARISON:  None.  FINDINGS: Right Kidney:  Length: 10 cm. There is diffuse cortical thinning with cortical thickness ranging between 10 and 15 mm. A shadowing echogenic structure is noted in the medullary region of the interpolar kidney, 6 mm in diameter. The renal cortical echogenicity is slightly greater than the neighboring liver. No hydronephrosis.  Left Kidney:  Length: 12 cm. There is diffuse mild cortical thinning, similar to the contralateral kidney. No hydronephrosis. There is increased cortical echogenicity, with abnormal corticomedullary differentiation.  Bladder:  Decompressed around a Foley catheter.  IMPRESSION: 1. Negative for hydronephrosis. There is complete bladder decompression. 2. Chronic medical renal disease.   Electronically Signed   By: Jorje Guild M.D.   On: 01/13/2014 02:18   Dg Chest Portable 1 View  01/12/2014   CLINICAL DATA:  Weakness and hypertension. History of atrial fibrillation  EXAM: PORTABLE CHEST - 1 VIEW  COMPARISON:  08/22/2013  FINDINGS: There is mild limitation related to exclusion of the lateral right costophrenic sulcus.  No cardiomegaly for technique. Stable mediastinal contours. There is an implantable event recorder over the left chest. No consolidation, edema, effusion, or pneumothorax.  IMPRESSION: No active disease.   Electronically Signed   By: Jorje Guild M.D.   On: 01/12/2014 22:51    2D Echo: None  Cardiac Cath: None  Admission HPI: Pt was directed admitted to the ICU from the ED. As per chart review this history was obtained-   73 y o M with PMH of polycythemia vera - positive Jak2 mutation, on hydroxyurea, HTN, on lisinopril and lasix, cardiomyopathy with an ejection fraction of 35% and COPD on budesonide, a-fib on carvedilol, amiodarone, warfarin who presented 01/13/2014 with syncope and was found to be hypotensive with AKI with Cr- 11.6 - with prior hx of severe diarrhea and lasix  use- pt was given 4L of Ns and was still hypotensive and so PCCM was consulted and pt was admitted to the [facility]. Pt was alert and oriented on presentation to the ED. Head Ct was done, showed only minimal chronic ischemic white matter disease, no infarcts or hrr. On admission- Cr- 11.6 marked elevation from baseline Cr- <1, BUN- 66, bicarb- 13. Pt was hydrated in the ICU, lasix, hydroxyurea and lisinopril were a held, and was given Bicarb infusion. Nephrology was consulted. Impression by nephrology was of ATN related to extreme hypotension and urinalysis consistent with that with granular casts. INR was also increased at 8.00, Vit k - 19m was given. INR today- 2.12  Hospital Course by problem list:  Hypotension with AKI- Hypotension likely cause of syncope. Aetiology multifactorial of Hypotension- dehydration from severe diarrhea, concomittant lasix use, and  lisinopril. Bmet revealed a Cr- 11.61 on admission, CBC with leukocytosis-14.4, with fever. No focus of infection identified, and was therefore attributed to hemocentration Vs stress reaction, trended back to normal on repeat after hydration. Pt was admitted to ICU after failing to respond to 4L of N/s. Pt Bp later responded with maintenance fluids and pt was later transferd back after 2 days. Nephrology consulted, impression- ischemic ATN. Home lisinopril and lasix were held on admission. Cr trended downwith IVF hydration and was 1.01 on discharge .Pt was discharge home with close follow up with his PCP in 2 days.   [place] Fib- Rate controlled without meds on admission. Rate- 51- 83 . Prior to admission. Home meds- Coreg- 12.68m BID, and amiodarone- 2078mdaily. These were held as pt was bradycardic and hypotensive on admission. On coumadin at home but with supratherapeutic INR -8,  on admission. Coumadin held. INR on discharge 1.77. Pt was started on coumadin prior to discharge, 2.71m52maily, dose was confirmed with pharmacy prior to discharge. Home meds-  Amiodarone and coreg can be restarted on follow up as out-pt. Pt will need INR check and dose adjustment of coumadin on follow up.  Polycythemia vera- With Jak-2 mutation, low EPO level. Biopsy negative. Pt saw Dr Ni Heath Larkncologist 10/24/2013. Has had 2 phlebotomies this year, and is on hydroxyurea. Hgb 2 months prior to admission- 16.2. Hgb on admission- 9-11 in the setting  of elevated INR. Pt also said he had a bloody bowel movement twice on admission, with positive FOBT. [facility] [place] [facility]. Hydroxyurea held on admission, but was restarted on discharge. Pt was discharged with close follow up in clinic in 2 days. Pt will require CBC to check Hgb.  COPD Stable on admission. Home meds- Albuterol and budesonide inhaler continued on admission and on discharge.   HTN- Home meds- Lisinopril- 71mg671mily, Coreg- 12.71mg,34msix 40mg 36my. Meds held on admission in the setting of AKI and hypotension. Can resume on follow up.   Discharge Vitals:   BP 113/71  Pulse 60  Temp(Src) 99.1 F (37.3 C) (Oral)  Resp 18  Ht 5' 6" (1.676 m)  Wt 165 lb 2 oz (74.9 kg)  BMI 26.66 kg/m2  SpO2 96%  Discharge Labs:  Results for orders placed during the hospital encounter of 01/12/14 (from the past 24 hour(s))  PROTIME-INR     Status: Abnormal   Collection Time    01/16/14  5:25 AM      Result Value Ref Range   Prothrombin Time 20.6 (*) 11.6 - 15.2 seconds   INR 1.77 (*) 0.00 - 1.49  CBC     Status: Abnormal   Collection Time    01/16/14  5:25 AM      Result Value Ref Range   WBC 4.7  4.0 - 10.5 K/uL   RBC 2.56 (*) 4.22 - 5.81 MIL/uL   Hemoglobin 11.0 (*) 13.0 - 17.0 g/dL   HCT 31.4 (*) 39.0 - 52.0 %   MCV 122.7 (*) 78.0 - 100.0 fL   MCH 43.0 (*) 26.0 - 34.0 pg   MCHC 35.0  30.0 - 36.0 g/dL   RDW 13.1  11.5 - 15.5 %   Platelets 158  150 - 400 K/uL  BASIC METABOLIC PANEL     Status: Abnormal   Collection Time    01/16/14  5:25 AM      Result  Value Ref Range   Sodium 143  137 - 147 mEq/L   Potassium 3.2 (*) 3.7 - 5.3 mEq/L   Chloride 103  96 - 112 mEq/L   CO2 28  19 - 32 mEq/L   Glucose, Bld 100 (*) 70 - 99 mg/dL   BUN 22  6 - 23 mg/dL   Creatinine, Ser 1.01  0.50 - 1.35 mg/dL   Calcium 7.9 (*) 8.4 - 10.5 mg/dL   GFR calc non Af Amer 74 (*) >90 mL/min   GFR calc Af Amer 85 (*) >90 mL/min  OCCULT BLOOD X 1 CARD TO LAB, STOOL     Status: Abnormal   Collection Time    01/16/14  3:55 PM      Result Value Ref Range   Fecal Occult Bld POSITIVE (*) NEGATIVE    Signed: Jenetta Downer, MD 01/16/2014, 4:57 PM    Services Ordered on Discharge: None Equipment Ordered on Discharge: None

## 2014-01-16 NOTE — Progress Notes (Signed)
  PROGRESS NOTE MEDICINE TEACHING ATTENDING   Day 4 of stay Patient name: [name]   Medical record number: [id number] Date of birth: [birth year]    68 year old malw transferred from ICU. Admitted for syncope, hypotension, supra therapeutic INR and AKI with Creatinine of 11.6 - impression ATN secondary to shock. Past medical history - cardiomyopathy ef 35%, COPD, afib on coumadin, amiodarone and warfarin. Doing well. Today complains of mild bleeding with bowel movement. Denies constipation. Feels "funny" in lower abdomen - denies pain. Not tender to palpation. Not tachycardic on exam. Vitals stable. Chart review done.    Inpatient Medications   Scheduled Medications . Chlorhexidine Gluconate Cloth  6 each Topical Q0600  . fluticasone  2 puff Inhalation BID  . mupirocin ointment  1 application Nasal BID  . Warfarin - Pharmacist Dosing Inpatient   Does not apply q1800    PRN Medications sodium chloride, albuterol   Assessment and Plan   The patient is doing well regarding most of the admission issues. His AKI has resolved. His vitals are stable. He is subthereaputic on INR. His amiodarone and coreg are still on hold. He is rate controlled without his medications - however he might need beta blockers given 35% EF ICM. Will defer to cardiology upon follow up as outpatient. His lower GI bleeding is likely due to hemorrhoids. The patient is asymptomatic. We will monitor today and if no further bleed/change in vitals then dc late in the evening. He will need CBC and follow up in clinic in 2 days.   I have discussed the care of this patient with my IM team residents. Please see the resident note for details.  [place], [place] 01/16/2014, 12:15 PM.

## 2014-01-18 ENCOUNTER — Ambulatory Visit (INDEPENDENT_AMBULATORY_CARE_PROVIDER_SITE_OTHER): Payer: Medicare Other | Admitting: Pharmacist

## 2014-01-18 ENCOUNTER — Encounter: Payer: Self-pay | Admitting: Internal Medicine

## 2014-01-18 ENCOUNTER — Ambulatory Visit (INDEPENDENT_AMBULATORY_CARE_PROVIDER_SITE_OTHER): Payer: Medicare Other | Admitting: Internal Medicine

## 2014-01-18 VITALS — BP 95/57 | HR 61 | Temp 97.5°F | Wt 184.2 lb

## 2014-01-18 DIAGNOSIS — N179 Acute kidney failure, unspecified: Secondary | ICD-10-CM

## 2014-01-18 DIAGNOSIS — I482 Chronic atrial fibrillation, unspecified: Secondary | ICD-10-CM

## 2014-01-18 DIAGNOSIS — W19XXXD Unspecified fall, subsequent encounter: Secondary | ICD-10-CM

## 2014-01-18 DIAGNOSIS — I159 Secondary hypertension, unspecified: Secondary | ICD-10-CM

## 2014-01-18 DIAGNOSIS — I4891 Unspecified atrial fibrillation: Secondary | ICD-10-CM

## 2014-01-18 DIAGNOSIS — Z7901 Long term (current) use of anticoagulants: Secondary | ICD-10-CM

## 2014-01-18 DIAGNOSIS — I10 Essential (primary) hypertension: Secondary | ICD-10-CM

## 2014-01-18 LAB — BASIC METABOLIC PANEL WITH GFR
BUN: 19 mg/dL (ref 6–23)
CHLORIDE: 101 meq/L (ref 96–112)
CO2: 29 mEq/L (ref 19–32)
Calcium: 8.5 mg/dL (ref 8.4–10.5)
Creat: 1.22 mg/dL (ref 0.50–1.35)
GFR, Est African American: 71 mL/min
GFR, Est Non African American: 61 mL/min
Glucose, Bld: 103 mg/dL — ABNORMAL HIGH (ref 70–99)
Potassium: 4.1 mEq/L (ref 3.5–5.3)
SODIUM: 143 meq/L (ref 135–145)

## 2014-01-18 LAB — CBC
HEMATOCRIT: 32.7 % — AB (ref 39.0–52.0)
HEMOGLOBIN: 11 g/dL — AB (ref 13.0–17.0)
MCH: 42.3 pg — ABNORMAL HIGH (ref 26.0–34.0)
MCHC: 33.6 g/dL (ref 30.0–36.0)
MCV: 125.8 fL — AB (ref 78.0–100.0)
Platelets: 202 10*3/uL (ref 150–400)
RBC: 2.6 MIL/uL — ABNORMAL LOW (ref 4.22–5.81)
RDW: 13 % (ref 11.5–15.5)
WBC: 7.3 10*3/uL (ref 4.0–10.5)

## 2014-01-18 LAB — POCT INR: INR: 1.5

## 2014-01-18 NOTE — Assessment & Plan Note (Signed)
BP today 95/57 since hospital discharge.   -Decrease carvedilol 12.5->6.25mg  -Continue amiodarone -Stop lisinopril & furosemide pending BMP results -Scheduled f/u for 7/6

## 2014-01-18 NOTE — Assessment & Plan Note (Signed)
INR 1.5 today; less blood in stool than that noted during recent hospitalization  -Per Dr. Johney Maine, increased dosage of warfarin and will continue following

## 2014-01-18 NOTE — Progress Notes (Addendum)
Patient ID: [name], male   DOB: 12/23/1945, 67 y.o.   MRN: [id number]   I met with [name] personally along with Dr Rushil Patel. He is doing well and reports no new complaints. His blood pressure medications were stopped on discharge. However he erroneously started all of them since yesterday - amiodarone 200mg daily, lasix 40mg daily, lisinopril 5mg daily and coreg 12.5 mg BID.  His vitals today are:   01/18/14 1027  BP: 95/57  Pulse: 61  Temp: 97.5 F (36.4 C)   He denies syncope, chest pain, dizziness or nausea, vomiting, abdominal pain.    I discussed with him the reason for our stopping his medications during admission (syncope, ATN). I explained that we would like to start them one at a time. He has taken them for only 1 day and he already has soft blood pressures, I fear he might drop further when the medications achieve steady state levels. Therefore, today we asked him to cut back to only amiodarone 200 mg daily and coreg 6.25 mg daily. He will stop taking lasix and lisinopril for now.   He will get stat BMP and CBC today (the lower GI bleed that he was having in the hospital has decreased per his report)  He will be notified by Dr Patel of his labs today  He will follow up with Dr Patel on Monday to review his blood pressure medications. In the meantime, he has been advised to take his blood pressure on Sat/Sun and call the IM resident on call at [facility] if it is very high.  We will also fix a cardiology appointment for him (he sees [facility] - last appointment March 2015) to reassess Afib, CHF and continued amiodarone/coreg/lasix use use.    If his creatinine trends up again, we will reconsult nephrology.   I saw and evaluated the patient.  I personally confirmed the key portions of the history and exam documented by Dr. Rushil Patel and I reviewed pertinent patient test results.  The assessment, diagnosis, and plan were formulated together and I agree with  the documentation in the resident's note.  ---------- Crt 1.22 today up from 1.02 on 6/30 at time of discharge. Hb stable at 11. Plan is to reassess on Monday for further medication changes.  Rushil Patel, PGY1  01/18/2014 1:57 PM   

## 2014-01-18 NOTE — Patient Instructions (Signed)
Patient instructed to take medications as defined in the Anti-coagulation Track section of this encounter.  Patient instructed to take today's dose.  Patient verbalized understanding of these instructions.    

## 2014-01-18 NOTE — Assessment & Plan Note (Addendum)
-  Decreased carvedilol 12.5->62.5mg  today given his BP in office (95/57) and recent hospitalization for AKI 2/2 severe hypovolemic shock -Scheduled follow-up visit on 01/22/14 to reassess  -Scheduled follow-up visit with cardiologist on 02/08/14

## 2014-01-18 NOTE — Progress Notes (Signed)
Anti-Coagulation Progress Note  [name] is a 68 y.o. male who is currently on an anti-coagulation regimen.    RECENT RESULTS: Recent results are below, the most recent result is correlated with a dose of 20 mg. per week: Lab Results  Component Value Date   INR 1.50 01/18/2014   INR 1.77* 01/16/2014   INR 2.12* 01/15/2014    ANTI-COAG DOSE: Anticoagulation Dose Instructions as of 01/18/2014     [name] Tue Wed Thu Fri Sat   New Dose 2.5 mg 5 mg 2.5 mg 2.5 mg 5 mg 5 mg 2.5 mg       ANTICOAG SUMMARY: Anticoagulation Episode Summary   Current INR goal 2.0-3.0  Next INR check 01/29/2014  INR from last check 1.50! (01/18/2014)  Weekly max dose   Target end date Indefinite  [facility]  Preferred lab   Send INR reminders to    Indications  Atrial fibrillation [427.31] Long term (current) use of anticoagulants [V58.61]        Comments         ANTICOAG TODAY: Anticoagulation Summary as of 01/18/2014   INR goal 2.0-3.0  Selected INR 1.50! (01/18/2014)  Next INR check 01/29/2014  Target end date Indefinite   Indications  Atrial fibrillation [427.31] Long term (current) use of anticoagulants [V58.61]      Anticoagulation Episode Summary   [facility]   Preferred lab    Send INR reminders to    Comments       PATIENT INSTRUCTIONS: Patient Instructions  Patient instructed to take medications as defined in the Anti-coagulation Track section of this encounter.  Patient instructed to take today's dose.  Patient verbalized understanding of these instructions.       FOLLOW-UP Return in 11 days (on 01/29/2014) for Follow up INR at 1000h.  [name], III Pharm.D., CACP

## 2014-01-18 NOTE — Progress Notes (Signed)
   Subjective:    Patient ID: [name], male    DOB: 10-02-1945, 68 y.o.   MRN: [id number]  HPI [name] is a 68 year old male with multiple medical conditions, including atrial fibrillation s/p ablation (Feb 2012), HTN, myeloproliferative disorder who presents today for a follow-up visit.   He was recently discharged from the hospital (6/30) after being admitted for a fall later to be attributed to acute kidney injury in the setting of severe hypovolemic shock. He was asked to stop lisinopril, furosemide, carvedilol, and amiodarone.   His BP today in clinic was 95/57 and did not stop taking the medications. The blood in his stools has decreased from when he was hospitalized; INR today is 1.2. Colonoscopy was done in the past though he does not remember where and unavailable in the EHR. He denies feeling dizzy, light-headedness, any falls, decrease in fluid intake.   Review of Systems  Constitutional: Negative for fatigue.  Cardiovascular: Negative for chest pain and palpitations.  Gastrointestinal: Negative for abdominal pain.  Genitourinary: Negative.   Neurological: Negative for dizziness, light-headedness and headaches.       Objective:   Physical Exam  Constitutional: He is oriented to person, place, and time. He appears well-developed.  HENT:  Head: Normocephalic and atraumatic.  Cardiovascular: Regular rhythm and normal heart sounds.   Pulmonary/Chest: Effort normal and breath sounds normal. No respiratory distress. He exhibits no tenderness.  Neurological: He is alert and oriented to person, place, and time.          Assessment & Plan:

## 2014-01-18 NOTE — Assessment & Plan Note (Signed)
-  Rechecked BMP today -Stopped diuretics (lisinopril & furosemide): pending BMP -Scheduled f/u visit on 7/6

## 2014-01-18 NOTE — Patient Instructions (Addendum)
Continue taking amiodarone and only HALF tablet of carvedilol.   Please STOP these medications for now and we will decide on them on Monday's visit: Lisinopril & Furosemide.  Given your changing blood pressure, please measure it at [facility] or [facility]. If it's high (more than 140/90), please call [phone] and ask for the resident on call.   Please schedule a follow-up visit for Monday, 01/22/2014.   We will schedule a visit with your heart doctor [facility]) so plan to go within 1-2 weeks (earlier the better).

## 2014-01-18 NOTE — Discharge Summary (Signed)
I discussed the plan for discharge for this patient with my IM team. I have reviewed the note by Dr Denton Brick and I agree with her documentation.  Thanks, [name] 01/18/2014 10:03 AM

## 2014-01-18 NOTE — Assessment & Plan Note (Signed)
-  No falls since hospital discharge on 6/30 -Continue assessing

## 2014-01-22 ENCOUNTER — Ambulatory Visit: Payer: Medicare Other

## 2014-01-22 ENCOUNTER — Ambulatory Visit (INDEPENDENT_AMBULATORY_CARE_PROVIDER_SITE_OTHER): Payer: Medicare Other | Admitting: Internal Medicine

## 2014-01-22 ENCOUNTER — Ambulatory Visit (INDEPENDENT_AMBULATORY_CARE_PROVIDER_SITE_OTHER): Payer: Medicare Other | Admitting: Pharmacist

## 2014-01-22 ENCOUNTER — Encounter: Payer: Self-pay | Admitting: Internal Medicine

## 2014-01-22 VITALS — BP 124/74 | HR 63 | Temp 98.8°F | Wt 184.4 lb

## 2014-01-22 DIAGNOSIS — R55 Syncope and collapse: Secondary | ICD-10-CM

## 2014-01-22 DIAGNOSIS — I4891 Unspecified atrial fibrillation: Secondary | ICD-10-CM

## 2014-01-22 DIAGNOSIS — Z7901 Long term (current) use of anticoagulants: Secondary | ICD-10-CM

## 2014-01-22 DIAGNOSIS — N179 Acute kidney failure, unspecified: Secondary | ICD-10-CM

## 2014-01-22 LAB — POCT INR: INR: 1.6

## 2014-01-22 MED ORDER — WARFARIN SODIUM 5 MG PO TABS: ORAL_TABLET | ORAL | Status: DC

## 2014-01-22 NOTE — Progress Notes (Signed)
Anti-Coagulation Progress Note  [name] is a 68 y.o. male who is currently on an anti-coagulation regimen.    RECENT RESULTS: Recent results are below, the most recent result is correlated with a dose of 25 mg. per week: Lab Results  Component Value Date   INR 1.60 01/22/2014   INR 1.50 01/18/2014   INR 1.77* 01/16/2014    ANTI-COAG DOSE: Anticoagulation Dose Instructions as of 01/22/2014     [name] Tue Wed Thu Fri Sat   New Dose 2.5 mg 5 mg 5 mg 5 mg 5 mg 5 mg 2.5 mg       ANTICOAG SUMMARY: Anticoagulation Episode Summary   Current INR goal 2.0-3.0  Next INR check 01/29/2014  INR from last check 1.60! (01/22/2014)  Weekly max dose   Target end date Indefinite  [facility]  Preferred lab   Send INR reminders to    Indications  Atrial fibrillation [427.31]        Comments         ANTICOAG TODAY: Anticoagulation Summary as of 01/22/2014   INR goal 2.0-3.0  Selected INR 1.60! (01/22/2014)  Next INR check 01/29/2014  Target end date Indefinite   Indications  Atrial fibrillation [427.31]      Anticoagulation Episode Summary   [facility]   Preferred lab    Send INR reminders to    Comments       PATIENT INSTRUCTIONS: Patient Instructions  Patient instructed to take medications as defined in the Anti-coagulation Track section of this encounter.  Patient instructed to take today's dose.  Patient verbalized understanding of these instructions.       FOLLOW-UP Return in 7 days (on 01/29/2014) for Follow up INR at [place], III Pharm.D., CACP

## 2014-01-22 NOTE — Assessment & Plan Note (Addendum)
BP today 124/74.   -Recheck BMP; Crt on 7/2 was 1.22 -Resume carvedilol and amiodarone -Continue holding lisinopril and furosemide -Scheduled to f/u with cardiologist on 7/23 for further changes in medication regimen

## 2014-01-22 NOTE — Patient Instructions (Addendum)
Please resume taking carvedilol and amiodarone. Stop taking furosemide and lisinopril.  Please follow-up with your PCP Dr. Aundra Dubin in 4 weeks.

## 2014-01-22 NOTE — Progress Notes (Signed)
   Subjective:    Patient ID: [name], male    DOB: 1946/06/13, 68 y.o.   MRN: [id number]  HPI [name] is a 68 year old male with multiple medical conditions, including atrial fibrillation s/p ablation (Feb 2012), HTN, myeloproliferative disorder who presents today to follow-up for his BP.   He came to clinic on 7/2 as a follow-up from his recent hospitalization for syncope and acute kidney injury in the setting of severe hypovolemic shock. BP was 95/57 so we asked him to stop lisinopril 5mg  and furosemide 40mg  and decrease carvedilol 25mg  to 12.5mg .   Today, his BP is 124/74. He has been taking amiodarone 100mg  (1/2 of the 200mg  tablet) and carvedilol 6.25mg  (1/2 of the 12.5mg  tablet). No dizziness, falls, chest pain, palpitations.   Review of Systems  Constitutional: Negative.   HENT: Negative.   Eyes: Negative.   Respiratory: Negative.   Cardiovascular: Negative for chest pain and palpitations.  Neurological: Negative for dizziness and syncope.       Objective:   Physical Exam  Constitutional: He is oriented to person, place, and time. He appears well-developed. No distress.  HENT:  Head: Normocephalic and atraumatic.  Cardiovascular: Regular rhythm, normal heart sounds and intact distal pulses.   Pulmonary/Chest: Effort normal and breath sounds normal.  Neurological: He is alert and oriented to person, place, and time.          Assessment & Plan:

## 2014-01-22 NOTE — Patient Instructions (Signed)
Patient instructed to take medications as defined in the Anti-coagulation Track section of this encounter.  Patient instructed to take today's dose.  Patient verbalized understanding of these instructions.    

## 2014-01-22 NOTE — Assessment & Plan Note (Signed)
No events since discharge. Continue monitoring.

## 2014-01-23 ENCOUNTER — Encounter: Payer: Self-pay | Admitting: Hematology and Oncology

## 2014-01-23 ENCOUNTER — Other Ambulatory Visit (HOSPITAL_BASED_OUTPATIENT_CLINIC_OR_DEPARTMENT_OTHER): Payer: Medicare Other

## 2014-01-23 ENCOUNTER — Ambulatory Visit (HOSPITAL_BASED_OUTPATIENT_CLINIC_OR_DEPARTMENT_OTHER): Payer: Medicare Other | Admitting: Hematology and Oncology

## 2014-01-23 ENCOUNTER — Telehealth: Payer: Self-pay | Admitting: Hematology and Oncology

## 2014-01-23 VITALS — BP 114/70 | HR 55 | Temp 97.3°F | Resp 20 | Ht 66.0 in | Wt 184.9 lb

## 2014-01-23 DIAGNOSIS — F17200 Nicotine dependence, unspecified, uncomplicated: Secondary | ICD-10-CM

## 2014-01-23 DIAGNOSIS — I4891 Unspecified atrial fibrillation: Secondary | ICD-10-CM

## 2014-01-23 DIAGNOSIS — D471 Chronic myeloproliferative disease: Secondary | ICD-10-CM

## 2014-01-23 DIAGNOSIS — D649 Anemia, unspecified: Secondary | ICD-10-CM

## 2014-01-23 DIAGNOSIS — D47Z9 Other specified neoplasms of uncertain behavior of lymphoid, hematopoietic and related tissue: Secondary | ICD-10-CM

## 2014-01-23 LAB — BASIC METABOLIC PANEL WITH GFR
BUN: 10 mg/dL (ref 6–23)
CO2: 29 mEq/L (ref 19–32)
Calcium: 8.5 mg/dL (ref 8.4–10.5)
Chloride: 105 mEq/L (ref 96–112)
Creat: 0.98 mg/dL (ref 0.50–1.35)
GFR, EST NON AFRICAN AMERICAN: 80 mL/min
GFR, Est African American: >89 mL/min
Glucose, Bld: 97 mg/dL (ref 70–99)
Potassium: 3.9 mEq/L (ref 3.5–5.3)
Sodium: 140 mEq/L (ref 135–145)

## 2014-01-23 LAB — CBC WITH DIFFERENTIAL/PLATELET
BASO%: 1 % (ref 0.0–2.0)
Basophils Absolute: 0.1 10*3/uL (ref 0.0–0.1)
EOS%: 2.6 % (ref 0.0–7.0)
Eosinophils Absolute: 0.2 10*3/uL (ref 0.0–0.5)
HEMATOCRIT: 31.5 % — AB (ref 38.4–49.9)
HGB: 10.8 g/dL — ABNORMAL LOW (ref 13.0–17.1)
LYMPH#: 1 10*3/uL (ref 0.9–3.3)
LYMPH%: 16.9 % (ref 14.0–49.0)
MCH: 43.3 pg — AB (ref 27.2–33.4)
MCHC: 34.2 g/dL (ref 32.0–36.0)
MCV: 126.6 fL — ABNORMAL HIGH (ref 79.3–98.0)
MONO#: 0.6 10*3/uL (ref 0.1–0.9)
MONO%: 10 % (ref 0.0–14.0)
NEUT#: 4.3 10*3/uL (ref 1.5–6.5)
NEUT%: 69.5 % (ref 39.0–75.0)
Platelets: 175 10*3/uL (ref 140–400)
RBC: 2.49 10*6/uL — ABNORMAL LOW (ref 4.20–5.82)
RDW: 13.1 % (ref 11.0–14.6)
WBC: 6.1 10*3/uL (ref 4.0–10.3)

## 2014-01-23 LAB — LACTATE DEHYDROGENASE (CC13): LDH: 180 U/L (ref 125–245)

## 2014-01-23 NOTE — Progress Notes (Signed)
INTERNAL MEDICINE TEACHING ATTENDING ADDENDUM - Aldine Contes M.D  Duration- indefinite, Indication- afib, INR- sub therapeutic. Agree with Dr. Gladstone Pih recommendations as outlined in his note.

## 2014-01-23 NOTE — Assessment & Plan Note (Signed)
The patient has no bleeding complications. He will continue anticoagulation therapy under the care of his primary care provider.

## 2014-01-23 NOTE — Progress Notes (Signed)
[facility] OFFICE PROGRESS NOTE  Patient Care Team: Annett Gula, MD as PCP - General (Internal Medicine) Artis Delay, MD as Consulting Physician (Hematology and Oncology)  SUMMARY OF ONCOLOGIC HISTORY: This is a pleasant gentleman who is being referred here because of high hemoglobin level. In November 2014, we removed one unit of blood and order an additional workup to rule out myeloproliferative disorder. Blood test result confirmed low erythropoietin level along with detectable JAK 2 mutation, confirmed myeloproliferative disorder Unfortunately, bone marrow biopsy on 06/26/2013 was nondiagnostic On 07/31/13 the patient was started on 1 hydroxyurea a day On 08/15/2013, increased hydroxyurea to 2 tablets a day and remove one more unit of blood due to the high hemoglobin On 09/05/2013, I increased hydroxyurea to 3 tablets a day and remove one more unit of blood due to high hemoglobin and hematocrit On 01/23/2014, dose of hydroxyurea is reduced to 2 tablets a day due to anemia.  INTERVAL HISTORY: [name] returns for further followup. He is compliant with taking hydroxyurea. He is attempting to quit smoking, down to 2 cigarettes a day. Denies any chest pain, dizziness or headaches. He continues on anticoagulation therapy, managed by his primary care provider. The patient denies any recent signs or symptoms of bleeding such as spontaneous epistaxis, hematuria or hematochezia.  REVIEW OF SYSTEMS:   Constitutional: Denies fevers, chills or abnormal weight loss Eyes: Denies blurriness of vision Ears, nose, mouth, throat, and face: Denies mucositis or sore throat Respiratory: Denies cough, dyspnea or wheezes Cardiovascular: Denies palpitation, chest discomfort or lower extremity swelling Gastrointestinal:  Denies nausea, heartburn or change in bowel habits Skin: Denies abnormal skin rashes Lymphatics: Denies new lymphadenopathy or easy bruising Neurological:Denies  numbness, tingling or new weaknesses Behavioral/Psych: Mood is stable, no new changes  All other systems were reviewed with the patient and are negative.  I have reviewed the past medical history, past surgical history, social history and family history with the patient and they are unchanged from previous note.  ALLERGIES:  is allergic to codeine.  MEDICATIONS:  Current Outpatient Prescriptions  Medication Sig Dispense Refill  . albuterol (PROAIR HFA) 108 (90 BASE) MCG/ACT inhaler Inhale into the lungs daily as needed for wheezing or shortness of breath.      [name] amiodarone (PACERONE) 200 MG tablet Take 200 mg by mouth daily.      . Budesonide (PULMICORT FLEXHALER) 90 MCG/ACT inhaler Inhale 1 puff into the lungs every evening.      . carvedilol (COREG) 12.5 MG tablet Take 12.5 mg by mouth 2 (two) times daily with a meal.      . hydroxyurea (HYDREA) 500 MG capsule Take 500 mg by mouth 3 (three) times daily. May take with food to minimize GI side effects.      . warfarin (COUMADIN) 5 MG tablet Take 5 mg by mouth. Take 1 tablet by mouth Monday - Friday; 1/2 tablet on Saturday & Sunday.       No current facility-administered medications for this visit.    PHYSICAL EXAMINATION: ECOG PERFORMANCE STATUS: 0 - Asymptomatic  Filed Vitals:   01/23/14 0930  BP: 114/70  Pulse: 55  Temp: 97.3 F (36.3 C)  Resp: 20   Filed Weights   01/23/14 0930  Weight: 184 lb 14.4 oz (83.87 kg)    GENERAL:alert, no distress and comfortable. He is moderately obese SKIN: skin color, texture, turgor are normal, no rashes or significant lesions EYES: normal, Conjunctiva are pink and non-injected, sclera clear OROPHARYNX:no  exudate, no erythema and lips, buccal mucosa, and tongue normal  NECK: supple, thyroid normal size, non-tender, without nodularity LYMPH:  no palpable lymphadenopathy in the cervical, axillary or inguinal LUNGS: clear to auscultation and percussion with normal breathing effort HEART:  regular rate & rhythm and no murmurs and no lower extremity edema ABDOMEN:abdomen soft, non-tender and normal bowel sounds Musculoskeletal:no cyanosis of digits and no clubbing  NEURO: alert & oriented x 3 with fluent speech, no focal motor/sensory deficits  LABORATORY DATA:  I have reviewed the data as listed    Component Value Date/Time   NA 140 01/22/2014 1620   NA 143 06/12/2013 1125   K 3.9 01/22/2014 1620   K 3.7 06/12/2013 1125   CL 105 01/22/2014 1620   CO2 29 01/22/2014 1620   CO2 32* 06/12/2013 1125   GLUCOSE 97 01/22/2014 1620   GLUCOSE 92 06/12/2013 1125   BUN 10 01/22/2014 1620   BUN 14.6 06/12/2013 1125   CREATININE 0.98 01/22/2014 1620   CREATININE 1.01 01/16/2014 0525   CREATININE 0.9 06/12/2013 1125   CALCIUM 8.5 01/22/2014 1620   CALCIUM 8.9 06/12/2013 1125   PROT 7.9 01/12/2014 2147   PROT 6.9 06/12/2013 1125   ALBUMIN 2.7* 01/15/2014 0413   ALBUMIN 3.3* 06/12/2013 1125   AST 13 01/12/2014 2147   AST 21 06/12/2013 1125   ALT 8 01/12/2014 2147   ALT 28 06/12/2013 1125   ALKPHOS 94 01/12/2014 2147   ALKPHOS 112 06/12/2013 1125   BILITOT 0.2* 01/12/2014 2147   BILITOT 0.82 06/12/2013 1125   GFRNONAA 80 01/22/2014 1620   GFRNONAA 74* 01/16/2014 0525   GFRAA >89 01/22/2014 1620   GFRAA 85* 01/16/2014 0525    No results found for this basename: SPEP,  UPEP,   kappa and lambda light chains    Lab Results  Component Value Date   WBC 6.1 01/23/2014   NEUTROABS 4.3 01/23/2014   HGB 10.8* 01/23/2014   HCT 31.5* 01/23/2014   MCV 126.6* 01/23/2014   PLT 175 01/23/2014      Chemistry      Component Value Date/Time   NA 140 01/22/2014 1620   NA 143 06/12/2013 1125   K 3.9 01/22/2014 1620   K 3.7 06/12/2013 1125   CL 105 01/22/2014 1620   CO2 29 01/22/2014 1620   CO2 32* 06/12/2013 1125   BUN 10 01/22/2014 1620   BUN 14.6 06/12/2013 1125   CREATININE 0.98 01/22/2014 1620   CREATININE 1.01 01/16/2014 0525   CREATININE 0.9 06/12/2013 1125      Component Value Date/Time   CALCIUM 8.5 01/22/2014 1620    CALCIUM 8.9 06/12/2013 1125   ALKPHOS 94 01/12/2014 2147   ALKPHOS 112 06/12/2013 1125   AST 13 01/12/2014 2147   AST 21 06/12/2013 1125   ALT 8 01/12/2014 2147   ALT 28 06/12/2013 1125   BILITOT 0.2* 01/12/2014 2147   BILITOT 0.82 06/12/2013 1125      ASSESSMENT & PLAN:  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 He tolerated the hydroxyurea well. Unfortunately, he is now developing anemia. I plan to reduce the dose to 2 tablets a day and see him back in 3 months.  Atrial fibrillation The patient has no bleeding complications. He will continue anticoagulation therapy under the care of his primary care provider.    Orders Placed This Encounter  Procedures  . CBC with Differential    Standing Status: Standing     Number of  Occurrences: 9     Standing Expiration Date: 01/24/2015   All questions were answered. The patient knows to call the clinic with any problems, questions or concerns. No barriers to learning was detected. I spent 15 minutes counseling the patient face to face. The total time spent in the appointment was 20 minutes and more than 50% was on counseling and review of test results     [place], [place], MD 01/23/2014 9:52 AM

## 2014-01-23 NOTE — Telephone Encounter (Signed)
gv adn rpinted appt sched and avs for pt for OCT.

## 2014-01-23 NOTE — Progress Notes (Signed)
INTERNAL MEDICINE TEACHING ATTENDING ADDENDUM - Aldine Contes, MD: I personally saw and evaluated [name] in this clinic visit in conjunction with the resident, Dr. Posey Pronto. I have discussed patient's plan of care with medical resident during this visit. I have confirmed the physical exam findings and have read and agree with the clinic note including the plan with the following addition: - Patient with no further episodes of syncope - BP now stable - On exam lungs are CTA b/l, Heart with RRR, normal heart sounds, no lower extremity edema  - INR is subtherapeutic. Pt was evaluated by Dr. Elie Confer. Agree with his recommendations for coumadin regimen - d/c lisinopril and furosemide. C/w amiodarone and carvedilol for now - case d/w patient and resident in detail

## 2014-01-23 NOTE — Assessment & Plan Note (Signed)
The patient has 2 mutation positive myeloproliferative disorder. He tolerated the hydroxyurea well. Unfortunately, he is now developing anemia. I plan to reduce the dose to 2 tablets a day and see him back in 3 months.

## 2014-01-29 ENCOUNTER — Ambulatory Visit: Payer: Medicare Other

## 2014-01-31 ENCOUNTER — Ambulatory Visit (INDEPENDENT_AMBULATORY_CARE_PROVIDER_SITE_OTHER): Payer: Medicare Other | Admitting: *Deleted

## 2014-01-31 DIAGNOSIS — R55 Syncope and collapse: Secondary | ICD-10-CM

## 2014-01-31 LAB — MDC_IDC_ENUM_SESS_TYPE_REMOTE: MDC IDC SESS DTM: 20150701040500

## 2014-02-01 ENCOUNTER — Encounter: Payer: Self-pay | Admitting: Internal Medicine

## 2014-02-05 ENCOUNTER — Ambulatory Visit (INDEPENDENT_AMBULATORY_CARE_PROVIDER_SITE_OTHER): Payer: Medicare Other | Admitting: Pharmacist

## 2014-02-05 ENCOUNTER — Ambulatory Visit: Payer: Medicare Other

## 2014-02-05 DIAGNOSIS — Z7901 Long term (current) use of anticoagulants: Secondary | ICD-10-CM

## 2014-02-05 DIAGNOSIS — I4891 Unspecified atrial fibrillation: Secondary | ICD-10-CM

## 2014-02-05 LAB — POCT INR: INR: 3

## 2014-02-05 NOTE — Progress Notes (Signed)
Loop recorder 

## 2014-02-05 NOTE — Patient Instructions (Signed)
Patient instructed to take medications as defined in the Anti-coagulation Track section of this encounter.  Patient instructed to take today's dose.  Patient verbalized understanding of these instructions.    

## 2014-02-05 NOTE — Progress Notes (Signed)
Anti-Coagulation Progress Note  [name] is a 68 y.o. male who is currently on an anti-coagulation regimen.    RECENT RESULTS: Recent results are below, the most recent result is correlated with a dose of 30 mg. per week: Lab Results  Component Value Date   INR 3.00 02/05/2014   INR 1.60 01/22/2014   INR 1.50 01/18/2014    ANTI-COAG DOSE: Anticoagulation Dose Instructions as of 02/05/2014     [name] Tue Wed Thu Fri Sat   New Dose 5 mg 2.5 mg 5 mg 2.5 mg 5 mg 2.5 mg 5 mg       ANTICOAG SUMMARY: Anticoagulation Episode Summary   Current INR goal 2.0-3.0  Next INR check 03/05/2014  INR from last check 3.00 (02/05/2014)  Weekly max dose   Target end date Indefinite  [facility]  Preferred lab   Send INR reminders to    Indications  Atrial fibrillation [427.31]        Comments         ANTICOAG TODAY: Anticoagulation Summary as of 02/05/2014   INR goal 2.0-3.0  Selected INR 3.00 (02/05/2014)  Next INR check 03/05/2014  Target end date Indefinite   Indications  Atrial fibrillation [427.31]      Anticoagulation Episode Summary   [facility]   Preferred lab    Send INR reminders to    Comments       PATIENT INSTRUCTIONS: Patient Instructions  Patient instructed to take medications as defined in the Anti-coagulation Track section of this encounter.  Patient instructed to take today's dose.  Patient verbalized understanding of these instructions.       FOLLOW-UP Return in 4 weeks (on 03/05/2014) for Follow up INR at 2:15pm.  [name], III Pharm.D., CACP

## 2014-02-08 ENCOUNTER — Ambulatory Visit (INDEPENDENT_AMBULATORY_CARE_PROVIDER_SITE_OTHER): Payer: Medicare Other | Admitting: Internal Medicine

## 2014-02-08 ENCOUNTER — Encounter: Payer: Self-pay | Admitting: Internal Medicine

## 2014-02-08 VITALS — BP 103/58 | HR 54 | Ht 66.0 in | Wt 184.4 lb

## 2014-02-08 DIAGNOSIS — R55 Syncope and collapse: Secondary | ICD-10-CM

## 2014-02-08 DIAGNOSIS — I5022 Chronic systolic (congestive) heart failure: Secondary | ICD-10-CM

## 2014-02-08 LAB — MDC_IDC_ENUM_SESS_TYPE_INCLINIC
MDC IDC SESS DTM: 20150723150119
MDC IDC SET ZONE DETECTION INTERVAL: 370 ms
Zone Setting Detection Interval: 2000 ms
Zone Setting Detection Interval: 3000 ms

## 2014-02-08 NOTE — Assessment & Plan Note (Signed)
He remains asymptomatic. His ILR shows atrial fib. No pauses.

## 2014-02-08 NOTE — Progress Notes (Signed)
HPI [name] is referred today by the [facility] for evaluation of atrial fibrillation and LV dysfunction. He has a h/o syncope and is s/p ILR.   He denies syncope. No chest pain. No edema. No syncope. Allergies  Allergen Reactions  . Codeine Rash     Current Outpatient Prescriptions  Medication Sig Dispense Refill  . albuterol (PROAIR HFA) 108 (90 BASE) MCG/ACT inhaler Inhale into the lungs daily as needed for wheezing or shortness of breath.      [name] amiodarone (PACERONE) 200 MG tablet Take 200 mg by mouth daily.      . Budesonide (PULMICORT FLEXHALER) 90 MCG/ACT inhaler Inhale 1 puff into the lungs every evening.      . carvedilol (COREG) 12.5 MG tablet Take 12.5 mg by mouth 2 (two) times daily with a meal.      . hydroxyurea (HYDREA) 500 MG capsule Take 500 mg by mouth 3 (three) times daily. May take with food to minimize GI side effects.      . warfarin (COUMADIN) 5 MG tablet Take 1 tablet by mouth Monday - Friday; 1/2 tablet on Saturday & Sunday.       No current facility-administered medications for this visit.     Past Medical History  Diagnosis Date  . Cardiomyopathy     EF55% 11/14<<35%   . Substance abuse     alcohol  . Hypertension   . Atrial fibrillation /flutter     hx of flutter ablation 2/12  . Shortness of breath     "all the time lately" (12/14/2012)  . GERD (gastroesophageal reflux disease)   . Hepatitis     "think I had that once; a long time ago; from dirty needles I think" (12/14/2012)  . 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 Leukocytosis, unspecified 06/12/2013  . 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 Obesity   . Syncope   . Syncope     ROS:   All systems reviewed and negative except as noted in the HPI.   Past Surgical History  Procedure Laterality Date  . Hemorrhoid surgery  1970's  . Cardiac electrophysiology mapping and ablation  08/2010    [name] 09/07/2010 (12/14/2012)  . Loop recorder implant  08-22-2013    MDT LinQ  implanted by Dr Caryl Comes for syncope     Family History  Problem Relation Age of Onset  . Diabetes Mother   . Hypertension Mother   . Cirrhosis Father   . Alcohol abuse Father      History   Social History  . Marital Status: Single    Spouse Name: N/A    Number of Children: N/A  . Years of Education: N/A   Occupational History  . Not on file.   Social History Main Topics  . Smoking status: Current Every Day Smoker -- 0.20 packs/day for 40 years    Types: Cigarettes  . Smokeless tobacco: Never Used     Comment: patient smoked 1-2ppd x 50 years, now smoking approx 4-5 cigs per day x 1 year  . Alcohol Use: 0.0 oz/week     Comment: drinks a beer once in a while for the past year, but has h/o heavy alcohol use for many years before this  . Drug Use: Yes    Special: "Crack" cocaine, Cocaine, Heroin     Comment: 03/20/2013 "been clean for awhile"; endorses using heroin "all my life" up until about a year ago  . Sexual Activity: Not Currently   Other Topics Concern  .  Not on file   Social History Narrative   Moved here from [place]. Lives with common law wife and grandchildren. 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."     BP 103/58  Pulse 54  Ht 5\' 6"  (1.676 m)  Wt 184 lb 6.4 oz (83.643 kg)  BMI 29.78 kg/m2  Physical Exam:  stable appearing 68 yo man, NAD HEENT: Unremarkable Neck:  6 cm JVD, no thyromegally Lungs:  Clear with no wheezes, rales, or rhonchi HEART:  Regular rate rhythm, no murmurs, no rubs, no clicks Abd:  soft, positive bowel sounds, no organomegally, no rebound, no guarding Ext:  2 plus pulses, no edema, no cyanosis, no clubbing Skin:  No rashes no nodules Neuro:  CN II through XII intact, motor grossly intact   Assess/Plan:

## 2014-02-08 NOTE — Patient Instructions (Signed)
Your physician wants you to follow-up in: 6 months with Dr Taylor You will receive a reminder letter in the mail two months in advance. If you don't receive a letter, please call our office to schedule the follow-up appointment.  

## 2014-02-08 NOTE — Assessment & Plan Note (Signed)
His symptoms remain class 2. Will continue his current meds.

## 2014-02-22 ENCOUNTER — Encounter: Payer: Self-pay | Admitting: Internal Medicine

## 2014-03-02 ENCOUNTER — Ambulatory Visit (INDEPENDENT_AMBULATORY_CARE_PROVIDER_SITE_OTHER): Payer: Medicare Other | Admitting: *Deleted

## 2014-03-02 DIAGNOSIS — R55 Syncope and collapse: Secondary | ICD-10-CM

## 2014-03-02 LAB — MDC_IDC_ENUM_SESS_TYPE_REMOTE: Date Time Interrogation Session: 20150819040500

## 2014-03-05 ENCOUNTER — Ambulatory Visit: Payer: Medicare Other

## 2014-03-09 NOTE — Progress Notes (Signed)
Loop recorder 

## 2014-03-12 ENCOUNTER — Ambulatory Visit: Payer: Medicare Other

## 2014-03-15 ENCOUNTER — Encounter: Payer: Self-pay | Admitting: Internal Medicine

## 2014-03-19 ENCOUNTER — Ambulatory Visit (INDEPENDENT_AMBULATORY_CARE_PROVIDER_SITE_OTHER): Payer: Medicare Other | Admitting: Pharmacist

## 2014-03-19 ENCOUNTER — Other Ambulatory Visit: Payer: Self-pay | Admitting: *Deleted

## 2014-03-19 DIAGNOSIS — I4891 Unspecified atrial fibrillation: Secondary | ICD-10-CM

## 2014-03-19 LAB — POCT INR: INR: 6.2

## 2014-03-19 MED ORDER — AMIODARONE HCL 200 MG PO TABS: 200.0000 mg | ORAL_TABLET | Freq: Every day | ORAL | Status: DC

## 2014-03-19 NOTE — Patient Instructions (Signed)
Patient instructed to take medications as defined in the Anti-coagulation Track section of this encounter.  Patient instructed to OMIT today's dose. OMIT tomorrow's dose as well. Recommence warfarin on Wednesday March 21, 2014. Patient verbalized understanding of these instructions.

## 2014-03-19 NOTE — Progress Notes (Signed)
Anti-Coagulation Progress Note  [name] is a 68 y.o. male who is currently on an anti-coagulation regimen.    RECENT RESULTS: Recent results are below, the most recent result is correlated with a dose of 27.5 mg. per week: Lab Results  Component Value Date   INR 6.20 03/19/2014   INR 3.00 02/05/2014   INR 1.60 01/22/2014    ANTI-COAG DOSE: Anticoagulation Dose Instructions as of 03/19/2014     [name] Tue Wed Thu Fri Sat   New Dose 5 mg 2.5 mg 2.5 mg 2.5 mg 2.5 mg 2.5 mg 5 mg    Description       OMIT/HOLD x 2 doses commencing on Monday March 19, 2014       ANTICOAG SUMMARY: Anticoagulation Episode Summary   Current INR goal 2.0-3.0  Next INR check 04/02/2014  INR from last check 6.20! (03/19/2014)  Weekly max dose   Target end date Indefinite  [facility]  Preferred lab   Send INR reminders to    Indications  Atrial fibrillation [427.31]        Comments         ANTICOAG TODAY: Anticoagulation Summary as of 03/19/2014   INR goal 2.0-3.0  Selected INR 6.20! (03/19/2014)  Next INR check 04/02/2014  Target end date Indefinite   Indications  Atrial fibrillation [427.31]      Anticoagulation Episode Summary   [facility]   Preferred lab    Send INR reminders to    Comments       PATIENT INSTRUCTIONS: Patient Instructions  Patient instructed to take medications as defined in the Anti-coagulation Track section of this encounter.  Patient instructed to OMIT today's dose. OMIT tomorrow's dose as well. Recommence warfarin on Wednesday March 21, 2014. Patient verbalized understanding of these instructions.       FOLLOW-UP Return in 2 weeks (on 04/02/2014) for Follow up INR at 0900h.  [name], III Pharm.D., CACP

## 2014-03-20 NOTE — Progress Notes (Signed)
Patient is on anticoagulation for atrial fibrillation, noted to have CHADS score of 2.  His INR was elevated > 6 today and his coumadin was decreased.  I reviewed Dr. Gladstone Pih note.

## 2014-03-22 ENCOUNTER — Other Ambulatory Visit: Payer: Self-pay | Admitting: Hematology and Oncology

## 2014-03-27 ENCOUNTER — Encounter: Payer: Self-pay | Admitting: Internal Medicine

## 2014-03-29 LAB — MDC_IDC_ENUM_SESS_TYPE_REMOTE: MDC IDC SESS DTM: 20150504040500

## 2014-04-02 ENCOUNTER — Other Ambulatory Visit: Payer: Self-pay | Admitting: *Deleted

## 2014-04-02 ENCOUNTER — Ambulatory Visit: Payer: Medicare Other

## 2014-04-02 ENCOUNTER — Ambulatory Visit (INDEPENDENT_AMBULATORY_CARE_PROVIDER_SITE_OTHER): Payer: Medicare Other | Admitting: *Deleted

## 2014-04-02 ENCOUNTER — Ambulatory Visit (INDEPENDENT_AMBULATORY_CARE_PROVIDER_SITE_OTHER): Payer: Medicare Other | Admitting: Pharmacist

## 2014-04-02 DIAGNOSIS — I4891 Unspecified atrial fibrillation: Secondary | ICD-10-CM

## 2014-04-02 DIAGNOSIS — D471 Chronic myeloproliferative disease: Secondary | ICD-10-CM

## 2014-04-02 DIAGNOSIS — R55 Syncope and collapse: Secondary | ICD-10-CM

## 2014-04-02 DIAGNOSIS — Z7901 Long term (current) use of anticoagulants: Secondary | ICD-10-CM

## 2014-04-02 LAB — POCT INR: INR: 4.9

## 2014-04-02 MED ORDER — CARVEDILOL 12.5 MG PO TABS: 12.5000 mg | ORAL_TABLET | Freq: Two times a day (BID) | ORAL | Status: DC

## 2014-04-02 NOTE — Progress Notes (Signed)
Anti-Coagulation Progress Note  [name] is a 68 y.o. male who is currently on an anti-coagulation regimen.    RECENT RESULTS: Recent results are below, the most recent result is correlated with a dose of 20 mg. per week: Lab Results  Component Value Date   INR 4.90 04/02/2014   INR 6.20 03/19/2014   INR 3.00 02/05/2014    ANTI-COAG DOSE: Anticoagulation Dose Instructions as of 04/02/2014     [name] Tue Wed Thu Fri Sat   New Dose 5 mg 2.5 mg 2.5 mg 2.5 mg 2.5 mg 2.5 mg 5 mg    Description       OMIT/HOLD x 1 doses commencing on Monday April 02, 2014       ANTICOAG SUMMARY: Anticoagulation Episode Summary   Current INR goal 2.0-3.0  Next INR check 04/16/2014  INR from last check 4.90! (04/02/2014)  Weekly max dose   Target end date Indefinite  [facility]  Preferred lab   Send INR reminders to    Indications  Atrial fibrillation [427.31]        Comments         ANTICOAG TODAY: Anticoagulation Summary as of 04/02/2014   INR goal 2.0-3.0  Selected INR 4.90! (04/02/2014)  Next INR check 04/16/2014  Target end date Indefinite   Indications  Atrial fibrillation [427.31]      Anticoagulation Episode Summary   [facility]   Preferred lab    Send INR reminders to    Comments       PATIENT INSTRUCTIONS: Patient Instructions  Patient instructed to take medications as defined in the Anti-coagulation Track section of this encounter.  Patient instructed to OMIT/HOLD today's dose.  Patient verbalized understanding of these instructions.       FOLLOW-UP Return in 2 weeks (on 04/16/2014) for Follow up INR at 0930h.  [name], III Pharm.D., CACP

## 2014-04-02 NOTE — Patient Instructions (Signed)
Patient instructed to take medications as defined in the Anti-coagulation Track section of this encounter.  Patient instructed to OMIT/HOLD today's dose.  Patient verbalized understanding of these instructions.    

## 2014-04-05 NOTE — Progress Notes (Signed)
Loop recorder 

## 2014-04-12 ENCOUNTER — Encounter: Payer: Self-pay | Admitting: Internal Medicine

## 2014-04-13 ENCOUNTER — Encounter: Payer: Self-pay | Admitting: Internal Medicine

## 2014-04-16 ENCOUNTER — Ambulatory Visit (INDEPENDENT_AMBULATORY_CARE_PROVIDER_SITE_OTHER): Payer: Medicare Other | Admitting: Pharmacist

## 2014-04-16 DIAGNOSIS — I4891 Unspecified atrial fibrillation: Secondary | ICD-10-CM

## 2014-04-16 DIAGNOSIS — Z7901 Long term (current) use of anticoagulants: Secondary | ICD-10-CM

## 2014-04-16 LAB — POCT INR: INR: 1.6

## 2014-04-16 NOTE — Patient Instructions (Signed)
Patient instructed to take medications as defined in the Anti-coagulation Track section of this encounter.  Patient instructed to take today's dose.  Patient verbalized understanding of these instructions.    

## 2014-04-16 NOTE — Progress Notes (Signed)
Anti-Coagulation Progress Note  [name] is a 68 y.o. male who is currently on an anti-coagulation regimen.    RECENT RESULTS: Recent results are below, the most recent result is correlated with a dose of 22.5 mg. per week: Lab Results  Component Value Date   INR 1.60 04/16/2014   INR 4.90 04/02/2014   INR 6.20 03/19/2014    ANTI-COAG DOSE: Anticoagulation Dose Instructions as of 04/16/2014     [name] Tue Wed Thu Fri Sat   New Dose 5 mg 2.5 mg 2.5 mg 2.5 mg 2.5 mg 2.5 mg 5 mg    Description       OMIT/HOLD x 1 doses commencing on Monday April 02, 2014       ANTICOAG SUMMARY: Anticoagulation Episode Summary   Current INR goal 2.0-3.0  Next INR check 05/07/2014  INR from last check 1.60! (04/16/2014)  Weekly max dose   Target end date Indefinite  [facility]  Preferred lab   Send INR reminders to    Indications  Atrial fibrillation [427.31]        Comments         ANTICOAG TODAY: Anticoagulation Summary as of 04/16/2014   INR goal 2.0-3.0  Selected INR 1.60! (04/16/2014)  Next INR check 05/07/2014  Target end date Indefinite   Indications  Atrial fibrillation [427.31]      Anticoagulation Episode Summary   [facility]   Preferred lab    Send INR reminders to    Comments       PATIENT INSTRUCTIONS: Patient Instructions  Patient instructed to take medications as defined in the Anti-coagulation Track section of this encounter.  Patient instructed to take today's dose.  Patient verbalized understanding of these instructions.        FOLLOW-UP Return in about 3 weeks (around 05/07/2014) for Follow-up INR at 0845h.   Theron Arista, PharmD Clinical Pharmacist - Resident Pager: [phone] 9/28/201511:09 AM

## 2014-04-17 LAB — MDC_IDC_ENUM_SESS_TYPE_REMOTE: Date Time Interrogation Session: 20150831040500

## 2014-04-18 NOTE — Progress Notes (Signed)
Indication: Atrial fibrillation. Duration: Lifelong. INR: Below target. Agree with Dr. Kristine Royal assessment and plan.

## 2014-04-23 NOTE — Progress Notes (Signed)
Ms. [name] is on anticoagulation for atrial fibrillation.  INR elevated at 4.9 and coumadin dose decreased.  I have reviewed Dr. Gladstone Pih note.

## 2014-04-24 ENCOUNTER — Other Ambulatory Visit (HOSPITAL_BASED_OUTPATIENT_CLINIC_OR_DEPARTMENT_OTHER): Payer: Medicare Other

## 2014-04-24 ENCOUNTER — Telehealth: Payer: Self-pay | Admitting: Hematology and Oncology

## 2014-04-24 ENCOUNTER — Ambulatory Visit (HOSPITAL_BASED_OUTPATIENT_CLINIC_OR_DEPARTMENT_OTHER): Payer: Medicare Other | Admitting: Hematology and Oncology

## 2014-04-24 ENCOUNTER — Encounter: Payer: Self-pay | Admitting: Hematology and Oncology

## 2014-04-24 VITALS — BP 125/70 | HR 51 | Temp 97.1°F | Resp 18 | Ht 66.0 in | Wt 188.5 lb

## 2014-04-24 DIAGNOSIS — D751 Secondary polycythemia: Secondary | ICD-10-CM

## 2014-04-24 DIAGNOSIS — D630 Anemia in neoplastic disease: Secondary | ICD-10-CM

## 2014-04-24 DIAGNOSIS — D6481 Anemia due to antineoplastic chemotherapy: Secondary | ICD-10-CM | POA: Insufficient documentation

## 2014-04-24 DIAGNOSIS — Z720 Tobacco use: Secondary | ICD-10-CM

## 2014-04-24 DIAGNOSIS — D471 Chronic myeloproliferative disease: Secondary | ICD-10-CM

## 2014-04-24 DIAGNOSIS — T451X5A Adverse effect of antineoplastic and immunosuppressive drugs, initial encounter: Secondary | ICD-10-CM

## 2014-04-24 DIAGNOSIS — D47Z9 Other specified neoplasms of uncertain behavior of lymphoid, hematopoietic and related tissue: Secondary | ICD-10-CM

## 2014-04-24 LAB — CBC WITH DIFFERENTIAL/PLATELET
BASO%: 0.8 % (ref 0.0–2.0)
Basophils Absolute: 0 10*3/uL (ref 0.0–0.1)
EOS%: 3.2 % (ref 0.0–7.0)
Eosinophils Absolute: 0.2 10*3/uL (ref 0.0–0.5)
HEMATOCRIT: 37.4 % — AB (ref 38.4–49.9)
HGB: 12.5 g/dL — ABNORMAL LOW (ref 13.0–17.1)
LYMPH%: 19.6 % (ref 14.0–49.0)
MCH: 41 pg — AB (ref 27.2–33.4)
MCHC: 33.5 g/dL (ref 32.0–36.0)
MCV: 122.4 fL — ABNORMAL HIGH (ref 79.3–98.0)
MONO#: 0.5 10*3/uL (ref 0.1–0.9)
MONO%: 8.2 % (ref 0.0–14.0)
NEUT#: 3.9 10*3/uL (ref 1.5–6.5)
NEUT%: 68.2 % (ref 39.0–75.0)
PLATELETS: 167 10*3/uL (ref 140–400)
RBC: 3.05 10*6/uL — ABNORMAL LOW (ref 4.20–5.82)
RDW: 13.4 % (ref 11.0–14.6)
WBC: 5.7 10*3/uL (ref 4.0–10.3)
lymph#: 1.1 10*3/uL (ref 0.9–3.3)

## 2014-04-24 NOTE — Assessment & Plan Note (Signed)
He is noncompliant and has missed many doses that was prescribed. To increase compliance, I recommend he takes hydroxyurea 2 tablets daily and consistently. I will see him back in a few months for further review.

## 2014-04-24 NOTE — Progress Notes (Signed)
[facility] OFFICE PROGRESS NOTE  Patient Care Team: Annett Gula, MD as PCP - General (Internal Medicine) Artis Delay, MD as Consulting Physician (Hematology and Oncology)  SUMMARY OF ONCOLOGIC HISTORY: This is a pleasant gentleman who is being referred here because of high hemoglobin level. In November 2014, we removed one unit of blood and order an additional workup to rule out myeloproliferative disorder. Blood test result confirmed low erythropoietin level along with detectable JAK 2 mutation, confirmed myeloproliferative disorder Unfortunately, bone marrow biopsy on 06/26/2013 was nondiagnostic On 07/31/13 the patient was started on 1 hydroxyurea a day On 08/15/2013, increased hydroxyurea to 2 tablets a day and remove one more unit of blood due to the high hemoglobin On 09/05/2013, I increased hydroxyurea to 3 tablets a day and remove one more unit of blood due to high hemoglobin and hematocrit On 01/23/2014, dose of hydroxyurea is reduced to 2 tablets a day due to anemia.  INTERVAL HISTORY: Please see below for problem oriented charting. He is noncompliant and was taking his medications sporadically. He continues to smoke. Denies recent diagnosis of blood clot.  REVIEW OF SYSTEMS:   Constitutional: Denies fevers, chills or abnormal weight loss Eyes: Denies blurriness of vision Ears, nose, mouth, throat, and face: Denies mucositis or sore throat Respiratory: Denies cough, dyspnea or wheezes Cardiovascular: Denies palpitation, chest discomfort or lower extremity swelling Gastrointestinal:  Denies nausea, heartburn or change in bowel habits Skin: Denies abnormal skin rashes Lymphatics: Denies new lymphadenopathy or easy bruising Neurological:Denies numbness, tingling or new weaknesses Behavioral/Psych: Mood is stable, no new changes  All other systems were reviewed with the patient and are negative.  I have reviewed the past medical history, past surgical history,  social history and family history with the patient and they are unchanged from previous note.  ALLERGIES:  is allergic to codeine.  MEDICATIONS:  Current Outpatient Prescriptions  Medication Sig Dispense Refill  . albuterol (PROAIR HFA) 108 (90 BASE) MCG/ACT inhaler Inhale into the lungs daily as needed for wheezing or shortness of breath.      [name] amiodarone (PACERONE) 200 MG tablet Take 1 tablet (200 mg total) by mouth daily.  90 tablet  0  . Budesonide (PULMICORT FLEXHALER) 90 MCG/ACT inhaler Inhale 1 puff into the lungs every evening.      . carvedilol (COREG) 12.5 MG tablet Take 1 tablet (12.5 mg total) by mouth 2 (two) times daily with a meal.  60 tablet  2  . hydroxyurea (HYDREA) 500 MG capsule take 3 capsules by mouth once daily *MAY TAKE WITH FOOD TO MINIMIZE GI SIDE EFFECTS  90 capsule  3  . warfarin (COUMADIN) 5 MG tablet Take 1 tablet by mouth Monday - Friday; 1/2 tablet on Saturday & Sunday.       No current facility-administered medications for this visit.    PHYSICAL EXAMINATION: ECOG PERFORMANCE STATUS: 1 - Symptomatic but completely ambulatory  Filed Vitals:   04/24/14 1003  BP: 125/70  Pulse: 51  Temp: 97.1 F (36.2 C)  Resp: 18   Filed Weights   04/24/14 1003  Weight: 188 lb 8 oz (85.503 kg)    GENERAL:alert, no distress and comfortable. He is morbidly obese SKIN: skin color, texture, turgor are normal, no rashes or significant lesions EYES: normal, Conjunctiva are pink and non-injected, sclera clear OROPHARYNX:no exudate, no erythema and lips, buccal mucosa, and tongue normal  NECK: supple, thyroid normal size, non-tender, without nodularity LYMPH:  no palpable lymphadenopathy in the cervical, axillary  or inguinal LUNGS: clear to auscultation and percussion with normal breathing effort HEART: regular rate & rhythm and no murmurs and no lower extremity edema ABDOMEN:abdomen soft, non-tender and normal bowel sounds Musculoskeletal:no cyanosis of digits and no  clubbing  NEURO: alert & oriented x 3 with fluent speech, no focal motor/sensory deficits  LABORATORY DATA:  I have reviewed the data as listed    Component Value Date/Time   NA 140 01/22/2014 1620   NA 143 06/12/2013 1125   K 3.9 01/22/2014 1620   K 3.7 06/12/2013 1125   CL 105 01/22/2014 1620   CO2 29 01/22/2014 1620   CO2 32* 06/12/2013 1125   GLUCOSE 97 01/22/2014 1620   GLUCOSE 92 06/12/2013 1125   BUN 10 01/22/2014 1620   BUN 14.6 06/12/2013 1125   CREATININE 0.98 01/22/2014 1620   CREATININE 1.01 01/16/2014 0525   CREATININE 0.9 06/12/2013 1125   CALCIUM 8.5 01/22/2014 1620   CALCIUM 8.9 06/12/2013 1125   PROT 7.9 01/12/2014 2147   PROT 6.9 06/12/2013 1125   ALBUMIN 2.7* 01/15/2014 0413   ALBUMIN 3.3* 06/12/2013 1125   AST 13 01/12/2014 2147   AST 21 06/12/2013 1125   ALT 8 01/12/2014 2147   ALT 28 06/12/2013 1125   ALKPHOS 94 01/12/2014 2147   ALKPHOS 112 06/12/2013 1125   BILITOT 0.2* 01/12/2014 2147   BILITOT 0.82 06/12/2013 1125   GFRNONAA 80 01/22/2014 1620   GFRNONAA 74* 01/16/2014 0525   GFRAA >89 01/22/2014 1620   GFRAA 85* 01/16/2014 0525    No results found for this basename: SPEP,  UPEP,   kappa and lambda light chains    Lab Results  Component Value Date   WBC 5.7 04/24/2014   NEUTROABS 3.9 04/24/2014   HGB 12.5* 04/24/2014   HCT 37.4* 04/24/2014   MCV 122.4* 04/24/2014   PLT 167 04/24/2014      Chemistry      Component Value Date/Time   NA 140 01/22/2014 1620   NA 143 06/12/2013 1125   K 3.9 01/22/2014 1620   K 3.7 06/12/2013 1125   CL 105 01/22/2014 1620   CO2 29 01/22/2014 1620   CO2 32* 06/12/2013 1125   BUN 10 01/22/2014 1620   BUN 14.6 06/12/2013 1125   CREATININE 0.98 01/22/2014 1620   CREATININE 1.01 01/16/2014 0525   CREATININE 0.9 06/12/2013 1125      Component Value Date/Time   CALCIUM 8.5 01/22/2014 1620   CALCIUM 8.9 06/12/2013 1125   ALKPHOS 94 01/12/2014 2147   ALKPHOS 112 06/12/2013 1125   AST 13 01/12/2014 2147   AST 21 06/12/2013 1125   ALT 8 01/12/2014  2147   ALT 28 06/12/2013 1125   BILITOT 0.2* 01/12/2014 2147   BILITOT 0.82 06/12/2013 1125     ASSESSMENT & PLAN:  Polycythemia He is noncompliant and has missed many doses that was prescribed. To increase compliance, I recommend he takes hydroxyurea 2 tablets daily and consistently. I will see him back in a few months for further review.  Tobacco abuse I spent some time counseling the patient the importance of tobacco cessation. he is currently attempting to quit on his own  I gave him patient education handout and encouraged him to sign up for smoking cessation class.   Anemia in neoplastic disease This is likely due to recent treatment. The patient denies recent history of bleeding such as epistaxis, hematuria or hematochezia. He is asymptomatic from the anemia. I will observe for now.  He does not require transfusion now. I will continue the chemotherapy at current dose without dosage adjustment.  If the anemia gets progressive worse in the future, I might have to delay his treatment or adjust the chemotherapy dose.     No orders of the defined types were placed in this encounter.   All questions were answered. The patient knows to call the clinic with any problems, questions or concerns. No barriers to learning was detected. I spent 15 minutes counseling the patient face to face. The total time spent in the appointment was 20 minutes and more than 50% was on counseling and review of test results     [facility], [place], MD 04/24/2014 9:24 PM

## 2014-04-24 NOTE — Assessment & Plan Note (Signed)
This is likely due to recent treatment. The patient denies recent history of bleeding such as epistaxis, hematuria or hematochezia. He is asymptomatic from the anemia. I will observe for now.  He does not require transfusion now. I will continue the chemotherapy at current dose without dosage adjustment.  If the anemia gets progressive worse in the future, I might have to delay his treatment or adjust the chemotherapy dose.  

## 2014-04-24 NOTE — Assessment & Plan Note (Signed)
I spent some time counseling the patient the importance of tobacco cessation. he is currently attempting to quit on his own  I gave him patient education handout and encouraged him to sign up for smoking cessation class.  

## 2014-04-24 NOTE — Telephone Encounter (Signed)
, °

## 2014-04-27 ENCOUNTER — Ambulatory Visit (INDEPENDENT_AMBULATORY_CARE_PROVIDER_SITE_OTHER): Payer: Medicare Other | Admitting: *Deleted

## 2014-04-27 DIAGNOSIS — R55 Syncope and collapse: Secondary | ICD-10-CM

## 2014-04-29 ENCOUNTER — Emergency Department (HOSPITAL_COMMUNITY): Payer: Medicare Other

## 2014-04-29 ENCOUNTER — Inpatient Hospital Stay (HOSPITAL_COMMUNITY)
Admission: EM | Admit: 2014-04-29 | Discharge: 2014-05-03 | DRG: 208 | Disposition: A | Discharge status: Home / Self Care | Payer: Medicare Other | Attending: Internal Medicine | Admitting: Internal Medicine

## 2014-04-29 ENCOUNTER — Encounter (HOSPITAL_COMMUNITY): Payer: Self-pay | Admitting: Emergency Medicine

## 2014-04-29 DIAGNOSIS — E669 Obesity, unspecified: Secondary | ICD-10-CM | POA: Diagnosis present

## 2014-04-29 DIAGNOSIS — Z885 Allergy status to narcotic agent status: Secondary | ICD-10-CM | POA: Diagnosis not present

## 2014-04-29 DIAGNOSIS — R634 Abnormal weight loss: Secondary | ICD-10-CM

## 2014-04-29 DIAGNOSIS — D471 Chronic myeloproliferative disease: Secondary | ICD-10-CM

## 2014-04-29 DIAGNOSIS — I4891 Unspecified atrial fibrillation: Secondary | ICD-10-CM | POA: Diagnosis present

## 2014-04-29 DIAGNOSIS — Z23 Encounter for immunization: Secondary | ICD-10-CM | POA: Diagnosis not present

## 2014-04-29 DIAGNOSIS — E876 Hypokalemia: Secondary | ICD-10-CM | POA: Diagnosis not present

## 2014-04-29 DIAGNOSIS — Z833 Family history of diabetes mellitus: Secondary | ICD-10-CM | POA: Diagnosis not present

## 2014-04-29 DIAGNOSIS — J440 Chronic obstructive pulmonary disease with acute lower respiratory infection: Secondary | ICD-10-CM | POA: Diagnosis present

## 2014-04-29 DIAGNOSIS — Z7901 Long term (current) use of anticoagulants: Secondary | ICD-10-CM

## 2014-04-29 DIAGNOSIS — Z720 Tobacco use: Secondary | ICD-10-CM

## 2014-04-29 DIAGNOSIS — R791 Abnormal coagulation profile: Secondary | ICD-10-CM

## 2014-04-29 DIAGNOSIS — I429 Cardiomyopathy, unspecified: Secondary | ICD-10-CM | POA: Diagnosis present

## 2014-04-29 DIAGNOSIS — J189 Pneumonia, unspecified organism: Principal | ICD-10-CM | POA: Diagnosis present

## 2014-04-29 DIAGNOSIS — I10 Essential (primary) hypertension: Secondary | ICD-10-CM | POA: Diagnosis present

## 2014-04-29 DIAGNOSIS — Z6827 Body mass index (BMI) 27.0-27.9, adult: Secondary | ICD-10-CM | POA: Diagnosis not present

## 2014-04-29 DIAGNOSIS — F17210 Nicotine dependence, cigarettes, uncomplicated: Secondary | ICD-10-CM | POA: Diagnosis present

## 2014-04-29 DIAGNOSIS — D630 Anemia in neoplastic disease: Secondary | ICD-10-CM

## 2014-04-29 DIAGNOSIS — D751 Secondary polycythemia: Secondary | ICD-10-CM | POA: Diagnosis present

## 2014-04-29 DIAGNOSIS — J9602 Acute respiratory failure with hypercapnia: Secondary | ICD-10-CM | POA: Diagnosis present

## 2014-04-29 DIAGNOSIS — Z79891 Long term (current) use of opiate analgesic: Secondary | ICD-10-CM | POA: Diagnosis not present

## 2014-04-29 DIAGNOSIS — N179 Acute kidney failure, unspecified: Secondary | ICD-10-CM

## 2014-04-29 DIAGNOSIS — J9601 Acute respiratory failure with hypoxia: Secondary | ICD-10-CM | POA: Diagnosis present

## 2014-04-29 DIAGNOSIS — R918 Other nonspecific abnormal finding of lung field: Secondary | ICD-10-CM

## 2014-04-29 DIAGNOSIS — K219 Gastro-esophageal reflux disease without esophagitis: Secondary | ICD-10-CM | POA: Diagnosis present

## 2014-04-29 DIAGNOSIS — D469 Myelodysplastic syndrome, unspecified: Secondary | ICD-10-CM | POA: Diagnosis present

## 2014-04-29 DIAGNOSIS — E440 Moderate protein-calorie malnutrition: Secondary | ICD-10-CM

## 2014-04-29 DIAGNOSIS — R0600 Dyspnea, unspecified: Secondary | ICD-10-CM

## 2014-04-29 DIAGNOSIS — E079 Disorder of thyroid, unspecified: Secondary | ICD-10-CM

## 2014-04-29 DIAGNOSIS — R748 Abnormal levels of other serum enzymes: Secondary | ICD-10-CM

## 2014-04-29 DIAGNOSIS — Z8249 Family history of ischemic heart disease and other diseases of the circulatory system: Secondary | ICD-10-CM | POA: Diagnosis not present

## 2014-04-29 DIAGNOSIS — I159 Secondary hypertension, unspecified: Secondary | ICD-10-CM

## 2014-04-29 DIAGNOSIS — B348 Other viral infections of unspecified site: Secondary | ICD-10-CM | POA: Diagnosis present

## 2014-04-29 DIAGNOSIS — R0602 Shortness of breath: Secondary | ICD-10-CM | POA: Diagnosis present

## 2014-04-29 DIAGNOSIS — I5022 Chronic systolic (congestive) heart failure: Secondary | ICD-10-CM

## 2014-04-29 DIAGNOSIS — I426 Alcoholic cardiomyopathy: Secondary | ICD-10-CM

## 2014-04-29 DIAGNOSIS — I428 Other cardiomyopathies: Secondary | ICD-10-CM

## 2014-04-29 DIAGNOSIS — R55 Syncope and collapse: Secondary | ICD-10-CM

## 2014-04-29 DIAGNOSIS — J9691 Respiratory failure, unspecified with hypoxia: Secondary | ICD-10-CM

## 2014-04-29 DIAGNOSIS — J9692 Respiratory failure, unspecified with hypercapnia: Secondary | ICD-10-CM

## 2014-04-29 DIAGNOSIS — F1010 Alcohol abuse, uncomplicated: Secondary | ICD-10-CM

## 2014-04-29 LAB — COMPREHENSIVE METABOLIC PANEL
ALT: 13 U/L (ref 0–53)
AST: 27 U/L (ref 0–37)
Albumin: 3.2 g/dL — ABNORMAL LOW (ref 3.5–5.2)
Alkaline Phosphatase: 103 U/L (ref 39–117)
Anion gap: 20 — ABNORMAL HIGH (ref 5–15)
BUN: 17 mg/dL (ref 6–23)
CALCIUM: 8.6 mg/dL (ref 8.4–10.5)
CHLORIDE: 100 meq/L (ref 96–112)
CO2: 20 mEq/L (ref 19–32)
CREATININE: 1.23 mg/dL (ref 0.50–1.35)
GFR calc non Af Amer: 59 mL/min — ABNORMAL LOW (ref >90)
GFR, EST AFRICAN AMERICAN: 69 mL/min — AB (ref >90)
Glucose, Bld: 272 mg/dL — ABNORMAL HIGH (ref 70–99)
POTASSIUM: 4.5 meq/L (ref 3.7–5.3)
SODIUM: 140 meq/L (ref 137–147)
TOTAL PROTEIN: 7.4 g/dL (ref 6.0–8.3)
Total Bilirubin: 0.6 mg/dL (ref 0.3–1.2)

## 2014-04-29 LAB — I-STAT VENOUS BLOOD GAS, ED
Acid-base deficit: 5 mmol/L — ABNORMAL HIGH (ref 0.0–2.0)
Bicarbonate: 27.8 mEq/L — ABNORMAL HIGH (ref 20.0–24.0)
O2 Saturation: 70 %
PO2 VEN: 54 mmHg — AB (ref 30.0–45.0)
Patient temperature: 37
TCO2: 31 mmol/L (ref 0–100)
pCO2, Ven: 97.3 mmHg (ref 45.0–50.0)
pH, Ven: 7.064 — CL (ref 7.250–7.300)

## 2014-04-29 LAB — GLUCOSE, CAPILLARY
GLUCOSE-CAPILLARY: 83 mg/dL (ref 70–99)
Glucose-Capillary: 105 mg/dL — ABNORMAL HIGH (ref 70–99)
Glucose-Capillary: 86 mg/dL (ref 70–99)
Glucose-Capillary: 85 mg/dL (ref 70–99)

## 2014-04-29 LAB — I-STAT ARTERIAL BLOOD GAS, ED
ACID-BASE DEFICIT: 1 mmol/L (ref 0.0–2.0)
Acid-Base Excess: 2 mmol/L (ref 0.0–2.0)
BICARBONATE: 29 meq/L — AB (ref 20.0–24.0)
Bicarbonate: 28.6 mEq/L — ABNORMAL HIGH (ref 20.0–24.0)
O2 Saturation: 100 %
O2 Saturation: 98 %
PH ART: 7.329 — AB (ref 7.350–7.450)
Patient temperature: 98.6
TCO2: 31 mmol/L (ref 0–100)
TCO2: 31 mmol/L (ref 0–100)
pCO2 arterial: 71.6 mmHg (ref 35.0–45.0)
pCO2 arterial: 55.2 mmHg — ABNORMAL HIGH (ref 35.0–45.0)
pH, Arterial: 7.209 — ABNORMAL LOW (ref 7.350–7.450)
pO2, Arterial: 387 mmHg — ABNORMAL HIGH (ref 80.0–100.0)
pO2, Arterial: 115 mmHg — ABNORMAL HIGH (ref 80.0–100.0)

## 2014-04-29 LAB — CBC
HCT: 36.2 % — ABNORMAL LOW (ref 39.0–52.0)
HEMATOCRIT: 41.4 % (ref 39.0–52.0)
Hemoglobin: 13.2 g/dL (ref 13.0–17.0)
Hemoglobin: 12 g/dL — ABNORMAL LOW (ref 13.0–17.0)
MCH: 40.2 pg — ABNORMAL HIGH (ref 26.0–34.0)
MCH: 40.7 pg — AB (ref 26.0–34.0)
MCHC: 31.9 g/dL (ref 30.0–36.0)
MCHC: 33.1 g/dL (ref 30.0–36.0)
MCV: 126.2 fL — ABNORMAL HIGH (ref 78.0–100.0)
MCV: 122.7 fL — ABNORMAL HIGH (ref 78.0–100.0)
PLATELETS: 131 10*3/uL — AB (ref 150–400)
Platelets: 200 10*3/uL (ref 150–400)
RBC: 3.28 MIL/uL — ABNORMAL LOW (ref 4.22–5.81)
RBC: 2.95 MIL/uL — AB (ref 4.22–5.81)
RDW: 13.6 % (ref 11.5–15.5)
RDW: 13.6 % (ref 11.5–15.5)
WBC: 15.9 10*3/uL — AB (ref 4.0–10.5)
WBC: 9.5 10*3/uL (ref 4.0–10.5)

## 2014-04-29 LAB — PROTIME-INR
INR: 2.64 — ABNORMAL HIGH (ref 0.00–1.49)
Prothrombin Time: 28.2 seconds — ABNORMAL HIGH (ref 11.6–15.2)

## 2014-04-29 LAB — I-STAT CG4 LACTIC ACID, ED
Lactic Acid, Venous: 6.96 mmol/L — ABNORMAL HIGH (ref 0.5–2.2)
Lactic Acid, Venous: 1.23 mmol/L (ref 0.5–2.2)

## 2014-04-29 LAB — CREATININE, SERUM
Creatinine, Ser: 0.95 mg/dL (ref 0.50–1.35)
GFR calc Af Amer: >90 mL/min (ref >90)
GFR calc non Af Amer: 85 mL/min — ABNORMAL LOW (ref >90)

## 2014-04-29 LAB — I-STAT CHEM 8, ED
BUN: 22 mg/dL (ref 6–23)
CALCIUM ION: 1.09 mmol/L — AB (ref 1.13–1.30)
Chloride: 103 mEq/L (ref 96–112)
Creatinine, Ser: 1.4 mg/dL — ABNORMAL HIGH (ref 0.50–1.35)
Glucose, Bld: 268 mg/dL — ABNORMAL HIGH (ref 70–99)
HEMATOCRIT: 44 % (ref 39.0–52.0)
HEMOGLOBIN: 15 g/dL (ref 13.0–17.0)
Potassium: 4.3 mEq/L (ref 3.7–5.3)
Sodium: 140 mEq/L (ref 137–147)
TCO2: 26 mmol/L (ref 0–100)

## 2014-04-29 LAB — TROPONIN I
Troponin I: <0.3 ng/mL (ref <0.30)
Troponin I: <0.3 ng/mL (ref <0.30)
Troponin I: <0.3 ng/mL (ref <0.30)

## 2014-04-29 LAB — I-STAT TROPONIN, ED
TROPONIN I, POC: 0 ng/mL (ref 0.00–0.08)
Troponin i, poc: 0.05 ng/mL (ref 0.00–0.08)

## 2014-04-29 LAB — PRO B NATRIURETIC PEPTIDE: PRO B NATRI PEPTIDE: 2902 pg/mL — AB (ref 0–125)

## 2014-04-29 LAB — MRSA PCR SCREENING: MRSA BY PCR: POSITIVE — AB

## 2014-04-29 MED ORDER — HEPARIN SODIUM (PORCINE) 5000 UNIT/ML IJ SOLN
5000.0000 [IU] | Freq: Three times a day (TID) | INTRAMUSCULAR | Status: DC
  Administered 2014-04-29 – 2014-05-02 (×10): 5000 [IU] via SUBCUTANEOUS
  Filled 2014-04-29 (×12): qty 1

## 2014-04-29 MED ORDER — MUPIROCIN 2 % EX OINT
1.0000 "application " | TOPICAL_OINTMENT | Freq: Two times a day (BID) | CUTANEOUS | Status: DC
  Administered 2014-04-29 – 2014-05-03 (×9): 1 via NASAL
  Filled 2014-04-29 (×2): qty 22

## 2014-04-29 MED ORDER — DEXTROSE 5 % IV SOLN
500.0000 mg | INTRAVENOUS | Status: DC
  Administered 2014-04-29 – 2014-05-01 (×3): 500 mg via INTRAVENOUS
  Filled 2014-04-29 (×3): qty 500

## 2014-04-29 MED ORDER — FENTANYL CITRATE 0.05 MG/ML IJ SOLN: 50.0000 ug | Freq: Once | INTRAMUSCULAR | Status: DC

## 2014-04-29 MED ORDER — INSULIN ASPART 100 UNIT/ML ~~LOC~~ SOLN
2.0000 [IU] | SUBCUTANEOUS | Status: DC
  Administered 2014-05-01 – 2014-05-02 (×2): 2 [IU] via SUBCUTANEOUS

## 2014-04-29 MED ORDER — IPRATROPIUM-ALBUTEROL 0.5-2.5 (3) MG/3ML IN SOLN
3.0000 mL | RESPIRATORY_TRACT | Status: DC | PRN
  Administered 2014-04-30: 3 mL via RESPIRATORY_TRACT
  Filled 2014-04-29 (×2): qty 3

## 2014-04-29 MED ORDER — ETOMIDATE 2 MG/ML IV SOLN
INTRAVENOUS | Status: AC
  Administered 2014-04-29: 20 mg
  Filled 2014-04-29: qty 20

## 2014-04-29 MED ORDER — CHLORHEXIDINE GLUCONATE 0.12 % MT SOLN
15.0000 mL | Freq: Two times a day (BID) | OROMUCOSAL | Status: DC
  Administered 2014-04-29 – 2014-05-01 (×4): 15 mL via OROMUCOSAL
  Filled 2014-04-29 (×3): qty 15

## 2014-04-29 MED ORDER — CARVEDILOL 12.5 MG PO TABS: 12.5000 mg | ORAL_TABLET | Freq: Two times a day (BID) | ORAL | Status: DC

## 2014-04-29 MED ORDER — CETYLPYRIDINIUM CHLORIDE 0.05 % MT LIQD
7.0000 mL | Freq: Four times a day (QID) | OROMUCOSAL | Status: DC
  Administered 2014-04-30 – 2014-05-01 (×8): 7 mL via OROMUCOSAL

## 2014-04-29 MED ORDER — ACETAMINOPHEN 325 MG PO TABS
650.0000 mg | ORAL_TABLET | Freq: Four times a day (QID) | ORAL | Status: DC | PRN
  Administered 2014-05-01: 650 mg via ORAL
  Filled 2014-04-29: qty 2

## 2014-04-29 MED ORDER — ASPIRIN 300 MG RE SUPP
300.0000 mg | RECTAL | Status: AC
  Filled 2014-04-29: qty 1

## 2014-04-29 MED ORDER — SUCCINYLCHOLINE CHLORIDE 20 MG/ML IJ SOLN
INTRAMUSCULAR | Status: AC
  Filled 2014-04-29: qty 1

## 2014-04-29 MED ORDER — SODIUM CHLORIDE 0.9 % IV SOLN
100.0000 ug/h | INTRAVENOUS | Status: DC
  Administered 2014-04-29: 50 ug/h via INTRAVENOUS
  Filled 2014-04-29: qty 50

## 2014-04-29 MED ORDER — DEXTROSE 5 % IV SOLN
2.0000 g | Freq: Once | INTRAVENOUS | Status: AC
  Administered 2014-04-29: 2 g via INTRAVENOUS
  Filled 2014-04-29: qty 2

## 2014-04-29 MED ORDER — SODIUM CHLORIDE 0.9 % IV SOLN
3.0000 g | Freq: Four times a day (QID) | INTRAVENOUS | Status: DC
  Administered 2014-04-29 – 2014-04-30 (×4): 3 g via INTRAVENOUS
  Filled 2014-04-29 (×6): qty 3

## 2014-04-29 MED ORDER — SENNOSIDES-DOCUSATE SODIUM 8.6-50 MG PO TABS
1.0000 | ORAL_TABLET | Freq: Two times a day (BID) | ORAL | Status: DC | PRN
  Filled 2014-04-29: qty 1

## 2014-04-29 MED ORDER — ROCURONIUM BROMIDE 50 MG/5ML IV SOLN
INTRAVENOUS | Status: AC
  Administered 2014-04-29: 100 mg
  Filled 2014-04-29: qty 2

## 2014-04-29 MED ORDER — SODIUM CHLORIDE 0.9 % IV BOLUS (SEPSIS)
1000.0000 mL | Freq: Once | INTRAVENOUS | Status: AC
  Administered 2014-04-29: 1000 mL via INTRAVENOUS

## 2014-04-29 MED ORDER — AMIODARONE HCL 200 MG PO TABS
200.0000 mg | ORAL_TABLET | Freq: Every day | ORAL | Status: DC
  Administered 2014-04-29 – 2014-05-03 (×5): 200 mg via ORAL
  Filled 2014-04-29 (×5): qty 1

## 2014-04-29 MED ORDER — CHLORHEXIDINE GLUCONATE CLOTH 2 % EX PADS
6.0000 | MEDICATED_PAD | Freq: Every day | CUTANEOUS | Status: DC
  Administered 2014-04-30 – 2014-05-03 (×4): 6 via TOPICAL

## 2014-04-29 MED ORDER — LIDOCAINE HCL (CARDIAC) 20 MG/ML IV SOLN
INTRAVENOUS | Status: AC
  Filled 2014-04-29: qty 5

## 2014-04-29 MED ORDER — SODIUM CHLORIDE 0.9 % IV SOLN: 250.0000 mL | INTRAVENOUS | Status: DC | PRN

## 2014-04-29 MED ORDER — IOHEXOL 350 MG/ML SOLN
100.0000 mL | Freq: Once | INTRAVENOUS | Status: AC | PRN
  Administered 2014-04-29: 100 mL via INTRAVENOUS

## 2014-04-29 MED ORDER — FENTANYL BOLUS VIA INFUSION
50.0000 ug | INTRAVENOUS | Status: DC | PRN
  Administered 2014-05-01: 100 ug via INTRAVENOUS
  Filled 2014-04-29: qty 100

## 2014-04-29 MED ORDER — ASPIRIN 81 MG PO CHEW: 324.0000 mg | CHEWABLE_TABLET | ORAL | Status: AC

## 2014-04-29 MED ORDER — FAMOTIDINE IN NACL 20-0.9 MG/50ML-% IV SOLN
20.0000 mg | Freq: Two times a day (BID) | INTRAVENOUS | Status: DC
  Administered 2014-04-29 – 2014-05-01 (×5): 20 mg via INTRAVENOUS
  Filled 2014-04-29 (×6): qty 50

## 2014-04-29 MED ORDER — IPRATROPIUM-ALBUTEROL 0.5-2.5 (3) MG/3ML IN SOLN
3.0000 mL | RESPIRATORY_TRACT | Status: DC
  Administered 2014-04-29 – 2014-05-01 (×13): 3 mL via RESPIRATORY_TRACT
  Filled 2014-04-29 (×13): qty 3

## 2014-04-29 MED ORDER — FENTANYL CITRATE 0.05 MG/ML IJ SOLN
0.0000 ug/h | INTRAMUSCULAR | Status: DC
  Administered 2014-04-29 – 2014-04-30 (×3): 150 ug/h via INTRAVENOUS
  Administered 2014-04-30: 200 ug/h via INTRAVENOUS
  Administered 2014-05-01: 250 ug/h via INTRAVENOUS
  Filled 2014-04-29 (×5): qty 50

## 2014-04-29 MED ORDER — VANCOMYCIN HCL 10 G IV SOLR
1500.0000 mg | Freq: Once | INTRAVENOUS | Status: DC
  Filled 2014-04-29: qty 1500

## 2014-04-29 NOTE — Progress Notes (Signed)
  Echocardiogram 2D Echocardiogram has been performed.  Mauricio Po 04/29/2014, 6:01 PM

## 2014-04-29 NOTE — ED Notes (Signed)
Patient arrives via EMS from home.  Patient was found on the floor lethargic and diaphoretic.  Wife told EMS that for the past day he was having more trouble breathing.  EMS reported crackles and audible wheezing.  They adm 1 neb and patient was placed on the CPAP  Initial sat was 60%, pulse 100, BP 180/80.  Upon arrival to the ED, sat was 80%.

## 2014-04-29 NOTE — H&P (Signed)
PULMONARY / CRITICAL CARE MEDICINE HISTORY AND PHYSICAL EXAMINATION   Name: [name] MRN: [id number] DOB: Jan 06, 1946    ADMISSION DATE:  04/29/2014  PRIMARY SERVICE: PCCM  CHIEF COMPLAINT: Resp failure  BRIEF PATIENT DESCRIPTION: 67yo with myeloproliferative d/o, h/o prior polysubstance abuse, Afib, h/o cardiomyopathy presents with resp failure.    SIGNIFICANT EVENTS / STUDIES:  04/29/14:  Admitted, Intubated in ED 2/2, CT Chest with diffuse small bilateral areas of consolidation with bronchial wall thickening.  No PE  LINES / TUBES: ETT:  10/11--> Foley:  10/11-->  CULTURES: Blood cx: 10/11-->  ANTIBIOTICS: Vanc 10/11-->10/11 Cefipime 10/11-->10/11  Unasyn 10/11--> Azithro 10/11-->  HISTORY OF PRESENT ILLNESS:  67yo with myeloproliferative d/o, h/o prior polysubstance abuse, Afib, h/o cardiomyopathy presents with hypoxic, hypercarbic resp failure.  Hx obtained from his partner via phone and from EMS records.    Pt was in his usual state of health until yest evening.  He went out to the store with some friends.  When he returned, he was c/o HA and weakness.  He then took an aspirin and went to bed.  His partner noted him to be audibly wheezing while he was asleep.  He awoke early this AM with severe SOB prompting EMS to be called.  Was noted to have sats of 60's in the field.  When he arrived here, he had increased WOB and sats in the 80's, prompting intubation.  Per report, pt has denied recent fevers/ chills.  His partner does have an upper resp infection, however.    PAST MEDICAL HISTORY :  Past Medical History  Diagnosis Date  . Cardiomyopathy     EF55% 11/14<<35%   . Substance abuse     alcohol  . Hypertension   . Atrial fibrillation /flutter     hx of flutter ablation 2/12  . Shortness of breath     "all the time lately" (12/14/2012)  . GERD (gastroesophageal reflux disease)   . Hepatitis     "think I had that once; a long time ago; from dirty  needles I think" (12/14/2012)  . 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 Leukocytosis, unspecified 06/12/2013  . 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 Obesity   . Syncope   . Syncope    Past Surgical History  Procedure Laterality Date  . Hemorrhoid surgery  1970's  . Cardiac electrophysiology mapping and ablation  08/2010    [name] 09/07/2010 (12/14/2012)  . Loop recorder implant  08-22-2013    MDT LinQ implanted by Dr Caryl Comes for syncope   Prior to Admission medications   Medication Sig Start Date End Date Taking? Authorizing Provider  warfarin (COUMADIN) 5 MG tablet Take 2.5-5 mg by mouth See admin instructions. Take 1 tablet Monday-Friday. Take 0.5 tablet on Saturday and Sunday.   Yes Historical Provider, MD  albuterol (PROAIR HFA) 108 (90 BASE) MCG/ACT inhaler Inhale into the lungs daily as needed for wheezing or shortness of breath.    Historical Provider, MD  amiodarone (PACERONE) 200 MG tablet Take 1 tablet (200 mg total) by mouth daily. 03/19/14   Cresenciano Genre, MD  Budesonide (PULMICORT FLEXHALER) 90 MCG/ACT inhaler Inhale 1 puff into the lungs every evening.    Historical Provider, MD  carvedilol (COREG) 12.5 MG tablet Take 1 tablet (12.5 mg total) by mouth 2 (two) times daily with a meal. 04/02/14   Cresenciano Genre, MD  hydroxyurea (HYDREA) 500 MG capsule take 3 capsules by mouth once daily *MAY TAKE WITH FOOD TO MINIMIZE GI  SIDE EFFECTS 03/22/14   Heath Lark, MD   Allergies  Allergen Reactions  . Codeine Rash    FAMILY HISTORY:  Family History  Problem Relation Age of Onset  . Diabetes Mother   . Hypertension Mother   . Cirrhosis Father   . Alcohol abuse Father    SOCIAL HISTORY: Still smokes 1/2ppd.  Former crack and heroin user.  Stopped approx 1 yr ago per report.  REVIEW OF SYSTEMS:  Unable to obtain 2/2 AMS  SUBJECTIVE:   VITAL SIGNS: Temp:  [97.5 F (36.4 C)-99.3 F (37.4 C)] 98.4 F (36.9 C) (10/11 0630) Pulse Rate:  [51-100] 58 (10/11 0630) Resp:  [14-26]  16 (10/11 0630) BP: (104-184)/(66-120) 104/66 mmHg (10/11 0630) SpO2:  [97 %-100 %] 100 % (10/11 0630) FiO2 (%):  [60 %] 60 % (10/11 0400) Weight:  [188 lb (85.276 kg)] 188 lb (85.276 kg) (10/11 0320) HEMODYNAMICS:   VENTILATOR SETTINGS: Vent Mode:  [-] PRVC FiO2 (%):  [60 %] 60 % Set Rate:  [18 bmp] 18 bmp Vt Set:  [450 mL] 450 mL PEEP:  [5 cmH20] 5 cmH20 Plateau Pressure:  [26 cmH20] 26 cmH20 INTAKE / OUTPUT: Intake/Output     10/10 0701 - 10/11 0700   I.V. (mL/kg) 50 (0.6)   Total Intake(mL/kg) 50 (0.6)   Net +50         PHYSICAL EXAMINATION: General:  Obese, sedated, intubated, NAD Neuro:  Opens eyes to voice, PERRL HEENT:  ETT in place Neck: No JVD appreciated but limited by habitus Cardiovascular:  RRR, no gallops Lungs:  Poor air movement with diffuse insp and exp wheezes Abdomen:  +BS, No masses Musculoskeletal:  Normal tone Skin:  No rash  LABS:  CBC  Recent Labs Lab 04/24/14 0951 04/29/14 0330 04/29/14 0342  WBC 5.7 15.9*  --   HGB 12.5* 13.2 15.0  HCT 37.4* 41.4 44.0  PLT 167 200  --    Coag's No results found for this basename: APTT, INR,  in the last 168 hours BMET  Recent Labs Lab 04/29/14 0330 04/29/14 0342  NA 140 140  K 4.5 4.3  CL 100 103  CO2 20  --   BUN 17 22  CREATININE 1.23 1.40*  GLUCOSE 272* 268*   Electrolytes  Recent Labs Lab 04/29/14 0330  CALCIUM 8.6   Sepsis Markers  Recent Labs Lab 04/29/14 0347 04/29/14 0556  LATICACIDVEN 6.96* 1.23   ABG  Recent Labs Lab 04/29/14 0423 04/29/14 0602  PHART 7.209* 7.329*  PCO2ART 71.6* 55.2*  PO2ART 387.0* 115.0*   Liver Enzymes  Recent Labs Lab 04/29/14 0330  AST 27  ALT 13  ALKPHOS 103  BILITOT 0.6  ALBUMIN 3.2*   Cardiac Enzymes  Recent Labs Lab 04/29/14 0330  PROBNP 2902.0*   Glucose No results found for this basename: GLUCAP,  in the last 168 hours  Imaging Ct Angio Chest W/cm &/or Wo Cm  04/29/2014   CLINICAL DATA:  Shortness of  breath, found down, unresponsive. Intubation. History of possible bone cancer.  EXAM: CT ANGIOGRAPHY CHEST WITH CONTRAST  TECHNIQUE: Multidetector CT imaging of the chest was performed using the standard protocol during bolus administration of intravenous contrast. Multiplanar CT image reconstructions and MIPs were obtained to evaluate the vascular anatomy.  CONTRAST:  126mL OMNIPAQUE IOHEXOL 350 MG/ML SOLN  COMPARISON:  Chest radiograph April 29, 2014 and CT angiogram of the chest August 22, 2013  FINDINGS: Adequate contrast opacification of the pulmonary artery's. Main pulmonary artery is  not enlarged. No pulmonary arterial filling defects to the level of the subsegmental branches.  Heart and pericardium are unremarkable, no right heart strain. 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 Mild calcific atherosclerosis of the aortic arch. No lymphadenopathy by CT size criteria. Endotracheal tube tip terminates in mid trachea. Tracheobronchial tree is patent, no pneumothorax. Moderate bronchial wall thickening. Dense patchy consolidation RIGHT middle lobe, RIGHT lower lobe and to lesser extent LEFT lower lobe. Scattered ground-glass nodules in the RIGHT upper lobe.  Included view of the abdomen is non acute ; nasogastric tube looped in proximal stomach. 2 cm ovoid hypodensity in RIGHT thyroid gland, unchanged for which follow up thyroid sonogram could be performed on a nonemergent basis. Ovoid radiopaque foreign body within the LEFT anterior chest subcutaneous back consistent with battery pack/heart monitor. Severe degenerative changes included RIGHT shoulder.  Review of the MIP images confirms the above findings.  IMPRESSION: No acute pulmonary embolism.  Patchy consolidation in the bilateral lungs in addition to bronchial wall thickening concerning for bronchopneumonia, less likely aspiration.  Similar aneurysmal ascending thoracic aorta, not tailored for  evaluation.   Electronically Signed   By: Elon Alas   On: 04/29/2014 05:03   Dg Chest Port 1 View  04/29/2014   CLINICAL DATA:  Endotracheal tube placement. Acute onset of shortness of breath. Initial encounter.  EXAM: PORTABLE CHEST - 1 VIEW  COMPARISON:  Chest radiograph performed 01/12/2014  FINDINGS: The patient's endotracheal tube is seen ending 4 cm above the carina. An enteric tube is noted extending below the diaphragm.  Medial bibasilar airspace opacification raises concern for multifocal pneumonia. Mild interstitial edema might have a similar appearance. Mild vascular congestion is seen. No pleural effusion or pneumothorax is identified.  The cardiomediastinal silhouette is borderline normal in size. No acute osseous abnormalities are identified.  IMPRESSION: 1. Endotracheal tube seen ending 4 cm above the carina. 2. Medial bibasilar airspace opacification raises concern for multifocal pneumonia. Mild interstitial edema might have a similar appearance. 3. Mild vascular congestion noted.   Electronically Signed   By: Garald Balding M.D.   On: 04/29/2014 04:59    EKG: Sinus tach, slight ST depression in V4, V5 CXR: ETT in place, basilar opacities noted, worse in RLL  ASSESSMENT / PLAN:  Principal Problem:   Acute respiratory failure with hypoxia and hypercapnia Active Problems:   Atrial fibrillation   Acute respiratory failure with hypoxia and hypercarbia   PULMONARY A: Hypoxic/ hypercarbic resp failure:  Diff dx includes COPD exacerbation, ACS, PNA (aspiration vs viral infection), CHF.  Blood gases improving since intubation Likely undx COPD given exam with severe wheeze  P:   -Unasyn and azithro for aspiration plus atypical coverage -Tracheal and blood cx  -Cycle CE's -TTE with bubble -Scheduled and prn duonebs -Resp viral panel -Cont MV -VAP bundle -Daily awakenings -Checking Utox  CARDIOVASCULAR A: H/o cardiomyopathy (??from ETOH) but last echo in 11/14 with  normal EF Afib Slight ST depression in V4, V5  P:   Cont home coreg and amio TTE with bubble Target euvolemia until further data available Cycling CE's Asa 325 given Checking INR.  Holding warfarin until results available  RENAL A: Elevated CR  P:   Foley in place Goal euvolemia while awaiting TTE Monitor UOP  GASTROINTESTINAL A: Need for nutrition P:   Nutrition c/s OG in place  HEMATOLOGIC A: Myeloproliferative d/o  P:   Holding home hydroxyurea fr now  INFECTIOUS  A: Aspiration PNA vs viral resp infection P:   ABX and w/u as above under pulm  ENDOCRINE A: Hyperglycemia, suspect undx DM P:   Checking A1C SSI  NEUROLOGIC A: Need for sedation H/o polysubstance abuse P:   Fentanyl gtt, target RASS 0 to -1 Utox  BEST PRACTICE / DISPOSITION Level of Care:  ICU Primary Service:  PCCM Consultants:  None Code Status:  Full Diet:  NPO DVT Px:  SQH GI Px:  H2 Skin Integrity:  Q2 turns Social / Family:  Partner updated via phone  TODAY'S SUMMARY: Presented to ED and intubated for resp failure.  CT suggests PNA.  Started on ABX.  TTE and infectious w/u pending.  I have personally obtained a history, examined the patient, evaluated laboratory and imaging results, formulated the assessment and plan and placed orders.  CRITICAL CARE: The patient is critically ill with multiple organ systems failure and requires high complexity decision making for assessment and support, frequent evaluation and titration of therapies, application of advanced monitoring technologies and extensive interpretation of multiple databases. Critical Care Time devoted to patient care services described in this note is 45 minutes.   Lucrezia Starch, MD Pulmonary and [place] Pager: [phone]   04/29/2014, 6:54 AM

## 2014-04-29 NOTE — Progress Notes (Signed)
Pt came in on CPAP and was placed on BiPAP in room. Pt WOB increased became diaphoretic and MD decided to intubate. Dr Claudine Mouton intubated with a MAC 4 and 8.0 tube with one insertion attempt SATS remained stable throughout procedure at 21 at the lip there was positive color change on CO2 detector, Bilateral breath sounds, condensation in tube and CXR confirmed placement. Pt was immediately transported to CT without complications and back to room. RT obtained an ABG and gave results to RN who will give results to MD. RT will continue to monitor

## 2014-04-29 NOTE — Progress Notes (Signed)
ANTIBIOTIC CONSULT NOTE - INITIAL  Pharmacy Consult for Unasyn Indication: asp pna  Allergies  Allergen Reactions  . Codeine Rash    Patient Measurements: Height: 5\' 10"  (177.8 cm) Weight: 188 lb (85.276 kg) IBW/kg (Calculated) : 73  Vital Signs: Temp: 99 F (37.2 C) (10/11 1000) Temp Source: Core (Comment) (10/11 0349) BP: 108/63 mmHg (10/11 0900) Pulse Rate: 66 (10/11 1000) Intake/Output from previous day: 10/10 0701 - 10/11 0700 In: 50 [I.V.:50] Out: -  Intake/Output from this shift: Total I/O In: 1050 [I.V.:1000; IV Piggyback:50] Out: 525 [Urine:525]  Labs:  Recent Labs  04/29/14 0330 04/29/14 0342  WBC 15.9*  --   HGB 13.2 15.0  PLT 200  --   CREATININE 1.23 1.40*   Estimated Creatinine Clearance: 53.6 ml/min (by C-G formula based on Cr of 1.4). No results found for this basename: VANCOTROUGH, VANCOPEAK, VANCORANDOM, GENTTROUGH, GENTPEAK, GENTRANDOM, TOBRATROUGH, TOBRAPEAK, TOBRARND, AMIKACINPEAK, AMIKACINTROU, AMIKACIN,  in the last 72 hours   Microbiology: No results found for this or any previous visit (from the past 720 hour(s)).  Medical History: Past Medical History  Diagnosis Date  . Cardiomyopathy     EF55% 11/14<<35%   . Substance abuse     alcohol  . Hypertension   . Atrial fibrillation /flutter     hx of flutter ablation 2/12  . Shortness of breath     "all the time lately" (12/14/2012)  . GERD (gastroesophageal reflux disease)   . Hepatitis     "think I had that once; a long time ago; from dirty needles I think" (12/14/2012)  . 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 Leukocytosis, unspecified 06/12/2013  . 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 Obesity   . Syncope   . Syncope     Medications:  Prescriptions prior to admission  Medication Sig Dispense Refill  . warfarin (COUMADIN) 5 MG tablet Take 2.5-5 mg by mouth See admin instructions. Take 1 tablet Monday-Friday. Take 0.5 tablet on Saturday and Sunday.      [name] albuterol  (PROAIR HFA) 108 (90 BASE) MCG/ACT inhaler Inhale into the lungs daily as needed for wheezing or shortness of breath.      [name] amiodarone (PACERONE) 200 MG tablet Take 1 tablet (200 mg total) by mouth daily.  90 tablet  0  . Budesonide (PULMICORT FLEXHALER) 90 MCG/ACT inhaler Inhale 1 puff into the lungs every evening.      . carvedilol (COREG) 12.5 MG tablet Take 1 tablet (12.5 mg total) by mouth 2 (two) times daily with a meal.  60 tablet  2  . hydroxyurea (HYDREA) 500 MG capsule take 3 capsules by mouth once daily *MAY TAKE WITH FOOD TO MINIMIZE GI SIDE EFFECTS  90 capsule  3   Assessment: 68 y.o. male presents with respiratory failure. To begin Unasyn and Azithromycin for asp pna + atypical coverage. WBC elevated. LA wnl.   Goal of Therapy:  Resolution of infection  Plan:  1) Unasyn 3gm IV q6h 2) Will f/u renal function, micro data, pt's clinical condition  Sherlon Handing, PharmD, BCPS Clinical pharmacist, pager [phone] 04/29/2014,10:46 AM

## 2014-04-29 NOTE — Progress Notes (Signed)
Utilization review completed.  

## 2014-04-29 NOTE — ED Provider Notes (Signed)
CSN: [id number]     Arrival date & time 04/29/14  1093 History   First MD Initiated Contact with Patient 04/29/14 0340     Chief Complaint  Patient presents with  . Respiratory Distress     (Consider location/radiation/quality/duration/timing/severity/associated sxs/prior Treatment) HPI [name] is a 68 y.o. male with past medical history of alcohol abuse, cardiomyopathy EF 55%, myeloproliferative neoplasm coming in with shortness of breath. History was obtained via EMS due to the acuity of the patient's condition. EMS states he had an oxygen of 60% on room air on arrival. He was tripoding, diaphoretic, and tachypneic.  He was immediately placed on CPAP and transferred to the emergency department.   Past Medical History  Diagnosis Date  . Cardiomyopathy     EF55% 11/14<<35%   . Substance abuse     alcohol  . Hypertension   . Atrial fibrillation /flutter     hx of flutter ablation 2/12  . Shortness of breath     "all the time lately" (12/14/2012)  . GERD (gastroesophageal reflux disease)   . Hepatitis     "think I had that once; a long time ago; from dirty needles I think" (12/14/2012)  . 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 Leukocytosis, unspecified 06/12/2013  . 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 Obesity   . Syncope   . Syncope    Past Surgical History  Procedure Laterality Date  . Hemorrhoid surgery  1970's  . Cardiac electrophysiology mapping and ablation  08/2010    [name] 09/07/2010 (12/14/2012)  . Loop recorder implant  08-22-2013    MDT LinQ implanted by Dr Caryl Comes for syncope   Family History  Problem Relation Age of Onset  . Diabetes Mother   . Hypertension Mother   . Cirrhosis Father   . Alcohol abuse Father    History  Substance Use Topics  . Smoking status: Current Every Day Smoker -- 0.20 packs/day for 40 years    Types: Cigarettes  . Smokeless tobacco: Never Used     Comment: patient smoked 1-2ppd x 50 years, now smoking approx 4-5  cigs per day x 1 year  . Alcohol Use: 0.0 oz/week     Comment: drinks a beer once in a while for the past year, but has h/o heavy alcohol use for many years before this    Review of Systems  Unable to perform ROS: Acuity of condition      Allergies  Codeine  Home Medications   Prior to Admission medications   Medication Sig Start Date End Date Taking? Authorizing Provider  warfarin (COUMADIN) 5 MG tablet Take 2.5-5 mg by mouth See admin instructions. Take 1 tablet Monday-Friday. Take 0.5 tablet on Saturday and Sunday.   Yes Historical Provider, MD  albuterol (PROAIR HFA) 108 (90 BASE) MCG/ACT inhaler Inhale into the lungs daily as needed for wheezing or shortness of breath.    Historical Provider, MD  amiodarone (PACERONE) 200 MG tablet Take 1 tablet (200 mg total) by mouth daily. 03/19/14   Cresenciano Genre, MD  Budesonide (PULMICORT FLEXHALER) 90 MCG/ACT inhaler Inhale 1 puff into the lungs every evening.    Historical Provider, MD  carvedilol (COREG) 12.5 MG tablet Take 1 tablet (12.5 mg total) by mouth 2 (two) times daily with a meal. 04/02/14   Cresenciano Genre, MD  hydroxyurea (HYDREA) 500 MG capsule take 3 capsules by mouth once daily *MAY TAKE WITH FOOD TO MINIMIZE GI SIDE EFFECTS 03/22/14   Heath Lark, MD  BP 104/66  Pulse 58  Temp(Src) 98.4 F (36.9 C) (Core (Comment))  Resp 16  Ht 5\' 10"  (1.778 m)  Wt 188 lb (85.276 kg)  BMI 26.98 kg/m2  SpO2 100% Physical Exam  ED Course  Procedures (including critical care time) Labs Review Labs Reviewed  CBC - Abnormal; Notable for the following:    WBC 15.9 (*)    RBC 3.28 (*)    MCV 126.2 (*)    MCH 40.2 (*)    All other components within normal limits  COMPREHENSIVE METABOLIC PANEL - Abnormal; Notable for the following:    Glucose, Bld 272 (*)    Albumin 3.2 (*)    GFR calc non Af Amer 59 (*)    GFR calc Af Amer 69 (*)    Anion gap 20 (*)    All other components within normal limits  PRO B NATRIURETIC PEPTIDE - Abnormal;  Notable for the following:    Pro B Natriuretic peptide (BNP) 2902.0 (*)    All other components within normal limits  I-STAT CHEM 8, ED - Abnormal; Notable for the following:    Creatinine, Ser 1.40 (*)    Glucose, Bld 268 (*)    Calcium, Ion 1.09 (*)    All other components within normal limits  I-STAT ARTERIAL BLOOD GAS, ED - Abnormal; Notable for the following:    pH, Arterial 7.209 (*)    pCO2 arterial 71.6 (*)    pO2, Arterial 387.0 (*)    Bicarbonate 28.6 (*)    All other components within normal limits  I-STAT VENOUS BLOOD GAS, ED - Abnormal; Notable for the following:    pH, Ven 7.064 (*)    pCO2, Ven 97.3 (*)    pO2, Ven 54.0 (*)    Bicarbonate 27.8 (*)    Acid-base deficit 5.0 (*)    All other components within normal limits  I-STAT CG4 LACTIC ACID, ED - Abnormal; Notable for the following:    Lactic Acid, Venous 6.96 (*)    All other components within normal limits  I-STAT ARTERIAL BLOOD GAS, ED - Abnormal; Notable for the following:    pH, Arterial 7.329 (*)    pCO2 arterial 55.2 (*)    pO2, Arterial 115.0 (*)    Bicarbonate 29.0 (*)    All other components within normal limits  CULTURE, BLOOD (ROUTINE X 2)  CULTURE, BLOOD (ROUTINE X 2)  BLOOD GAS, ARTERIAL  I-STAT TROPOININ, ED  I-STAT CG4 LACTIC ACID, ED  I-STAT TROPOININ, ED    Imaging Review Ct Angio Chest W/cm &/or Wo Cm  04/29/2014   CLINICAL DATA:  Shortness of breath, found down, unresponsive. Intubation. History of possible bone cancer.  EXAM: CT ANGIOGRAPHY CHEST WITH CONTRAST  TECHNIQUE: Multidetector CT imaging of the chest was performed using the standard protocol during bolus administration of intravenous contrast. Multiplanar CT image reconstructions and MIPs were obtained to evaluate the vascular anatomy.  CONTRAST:  151mL OMNIPAQUE IOHEXOL 350 MG/ML SOLN  COMPARISON:  Chest radiograph April 29, 2014 and CT angiogram of the chest August 22, 2013  FINDINGS: Adequate contrast opacification of the  pulmonary artery's. Main pulmonary artery is not enlarged. No pulmonary arterial filling defects to the level of the subsegmental branches.  Heart and pericardium are unremarkable, no right heart strain. 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 Mild calcific atherosclerosis of the aortic arch. No lymphadenopathy by CT size criteria. Endotracheal tube tip terminates in mid  trachea. Tracheobronchial tree is patent, no pneumothorax. Moderate bronchial wall thickening. Dense patchy consolidation RIGHT middle lobe, RIGHT lower lobe and to lesser extent LEFT lower lobe. Scattered ground-glass nodules in the RIGHT upper lobe.  Included view of the abdomen is non acute ; nasogastric tube looped in proximal stomach. 2 cm ovoid hypodensity in RIGHT thyroid gland, unchanged for which follow up thyroid sonogram could be performed on a nonemergent basis. Ovoid radiopaque foreign body within the LEFT anterior chest subcutaneous back consistent with battery pack/heart monitor. Severe degenerative changes included RIGHT shoulder.  Review of the MIP images confirms the above findings.  IMPRESSION: No acute pulmonary embolism.  Patchy consolidation in the bilateral lungs in addition to bronchial wall thickening concerning for bronchopneumonia, less likely aspiration.  Similar aneurysmal ascending thoracic aorta, not tailored for evaluation.   Electronically Signed   By: Elon Alas   On: 04/29/2014 05:03   Dg Chest Port 1 View  04/29/2014   CLINICAL DATA:  Endotracheal tube placement. Acute onset of shortness of breath. Initial encounter.  EXAM: PORTABLE CHEST - 1 VIEW  COMPARISON:  Chest radiograph performed 01/12/2014  FINDINGS: The patient's endotracheal tube is seen ending 4 cm above the carina. An enteric tube is noted extending below the diaphragm.  Medial bibasilar airspace opacification raises concern for multifocal pneumonia. Mild interstitial edema might  have a similar appearance. Mild vascular congestion is seen. No pleural effusion or pneumothorax is identified.  The cardiomediastinal silhouette is borderline normal in size. No acute osseous abnormalities are identified.  IMPRESSION: 1. Endotracheal tube seen ending 4 cm above the carina. 2. Medial bibasilar airspace opacification raises concern for multifocal pneumonia. Mild interstitial edema might have a similar appearance. 3. Mild vascular congestion noted.   Electronically Signed   By: Garald Balding M.D.   On: 04/29/2014 04:59     EKG Interpretation None    MUSE not working:   MDM   Final diagnoses:  Respiratory failure with hypoxia and hypercapnia    Patient presents to the emergency department for shortness of breath. Per EMS his wife stated he has shortness of breath for the past week or so but denies any other symptoms. He was initially pale diaphoretic and tachypneic. Oxygen saturation on CPAP was in the 80s. He was immediately intubated in the emergency department, given IV fluid boluses, and broad-spectrum antibiotics. Patient has a history of myeloproliferative neoplasm, CTA was obtained for pulmonary embolism which was negative. It did show concerns for pneumonia. Patient was covered with antibiotics. I am unsure if this is the etiology of the patient's hypoxic, and hypercapnic respiratory failure. Repeat ABG reveals a normal pH and CO2. Patient was admitted to the intensivist for continued treatment.  CRITICAL CARE Performed by: Everlene Balls   Total critical care time: 40 minutes  Critical care time was exclusive of separately billable procedures and treating other patients.  Critical care was necessary to treat or prevent imminent or life-threatening deterioration.  Critical care was time spent personally by me on the following activities: development of treatment plan with patient and/or surrogate as well as nursing, discussions with consultants, evaluation of patient's  response to treatment, examination of patient, obtaining history from patient or surrogate, ordering and performing treatments and interventions, ordering and review of laboratory studies, ordering and review of radiographic studies, pulse oximetry and re-evaluation of patient's condition.   INTUBATION Performed by: Everlene Balls  Required items: required blood products, implants, devices, and special equipment available Patient identity confirmed: provided demographic data and  hospital-assigned identification number Time out: Immediately prior to procedure a "time out" was called to verify the correct patient, procedure, equipment, support staff and site/side marked as required.  Indications: Hypoxic respiratory failure   Intubation method: Direct  Laryngoscopy   Preoxygenation: 100% BVM  Sedatives: 20 mg Etomidate Paralytic: 100 mg Rocuronium  Tube Size: 8-0 cuffed  Post-procedure assessment: chest rise and ETCO2 monitor Breath sounds: equal and absent over the epigastrium Tube secured with: ETT holder Chest x-ray interpreted by radiologist and me.  Chest x-ray findings: endotracheal tube in appropriate position  Patient tolerated the procedure well with no immediate complications.       Everlene Balls, MD 04/29/14 [phone]

## 2014-04-29 NOTE — ED Notes (Signed)
Report called  

## 2014-04-29 NOTE — ED Notes (Signed)
Patient opens eyes when spoken to.  Moving arms  Explained where he is and what has happened.  Patient began to shake his head yes.

## 2014-04-30 ENCOUNTER — Encounter: Payer: Self-pay | Admitting: Internal Medicine

## 2014-04-30 DIAGNOSIS — N179 Acute kidney failure, unspecified: Secondary | ICD-10-CM

## 2014-04-30 DIAGNOSIS — J9601 Acute respiratory failure with hypoxia: Secondary | ICD-10-CM

## 2014-04-30 DIAGNOSIS — J9600 Acute respiratory failure, unspecified whether with hypoxia or hypercapnia: Secondary | ICD-10-CM

## 2014-04-30 DIAGNOSIS — F1010 Alcohol abuse, uncomplicated: Secondary | ICD-10-CM

## 2014-04-30 LAB — BASIC METABOLIC PANEL
Anion gap: 10 (ref 5–15)
BUN: 14 mg/dL (ref 6–23)
CALCIUM: 8.4 mg/dL (ref 8.4–10.5)
CO2: 28 mEq/L (ref 19–32)
CREATININE: 0.98 mg/dL (ref 0.50–1.35)
Chloride: 107 mEq/L (ref 96–112)
GFR calc Af Amer: >90 mL/min (ref >90)
GFR calc non Af Amer: 84 mL/min — ABNORMAL LOW (ref >90)
Glucose, Bld: 89 mg/dL (ref 70–99)
Potassium: 3.9 mEq/L (ref 3.7–5.3)
Sodium: 145 mEq/L (ref 137–147)

## 2014-04-30 LAB — CBC
HEMATOCRIT: 33.1 % — AB (ref 39.0–52.0)
Hemoglobin: 11 g/dL — ABNORMAL LOW (ref 13.0–17.0)
MCH: 40.7 pg — AB (ref 26.0–34.0)
MCHC: 33.2 g/dL (ref 30.0–36.0)
MCV: 122.6 fL — AB (ref 78.0–100.0)
Platelets: 127 10*3/uL — ABNORMAL LOW (ref 150–400)
RBC: 2.7 MIL/uL — AB (ref 4.22–5.81)
RDW: 13.7 % (ref 11.5–15.5)
WBC: 6.7 10*3/uL (ref 4.0–10.5)

## 2014-04-30 LAB — BLOOD GAS, ARTERIAL
Acid-Base Excess: 2.1 mmol/L — ABNORMAL HIGH (ref 0.0–2.0)
Bicarbonate: 26.8 mEq/L — ABNORMAL HIGH (ref 20.0–24.0)
Drawn by: 39866
FIO2: 0.4 %
LHR: 18 {breaths}/min
MECHVT: 450 mL
O2 Saturation: 96.6 %
PCO2 ART: 46.5 mmHg — AB (ref 35.0–45.0)
PEEP: 5 cmH2O
PH ART: 7.377 (ref 7.350–7.450)
Patient temperature: 98.4
TCO2: 28.2 mmol/L (ref 0–100)
pO2, Arterial: 83.1 mmHg (ref 80.0–100.0)

## 2014-04-30 LAB — MAGNESIUM
MAGNESIUM: 1.7 mg/dL (ref 1.5–2.5)
MAGNESIUM: 1.8 mg/dL (ref 1.5–2.5)

## 2014-04-30 LAB — PROTIME-INR
INR: 1.8 — AB (ref 0.00–1.49)
PROTHROMBIN TIME: 20.9 s — AB (ref 11.6–15.2)

## 2014-04-30 LAB — PHOSPHORUS
Phosphorus: 3.1 mg/dL (ref 2.3–4.6)
Phosphorus: 2.7 mg/dL (ref 2.3–4.6)

## 2014-04-30 LAB — GLUCOSE, CAPILLARY
GLUCOSE-CAPILLARY: 82 mg/dL (ref 70–99)
Glucose-Capillary: 84 mg/dL (ref 70–99)
Glucose-Capillary: 88 mg/dL (ref 70–99)
Glucose-Capillary: 97 mg/dL (ref 70–99)
Glucose-Capillary: 104 mg/dL — ABNORMAL HIGH (ref 70–99)
Glucose-Capillary: 108 mg/dL — ABNORMAL HIGH (ref 70–99)

## 2014-04-30 LAB — HEMOGLOBIN A1C
HEMOGLOBIN A1C: 5.3 % (ref <5.7)
Mean Plasma Glucose: 105 mg/dL (ref <117)

## 2014-04-30 LAB — RESPIRATORY VIRUS PANEL
ADENOVIRUS: NOT DETECTED
Influenza A H1: NOT DETECTED
Influenza A H3: NOT DETECTED
Influenza A: NOT DETECTED
Influenza B: NOT DETECTED
METAPNEUMOVIRUS: NOT DETECTED
PARAINFLUENZA 1 A: NOT DETECTED
PARAINFLUENZA 3 A: NOT DETECTED
Parainfluenza 2: NOT DETECTED
Respiratory Syncytial Virus A: NOT DETECTED
Respiratory Syncytial Virus B: NOT DETECTED
Rhinovirus: DETECTED — AB

## 2014-04-30 MED ORDER — PRO-STAT SUGAR FREE PO LIQD
30.0000 mL | Freq: Two times a day (BID) | ORAL | Status: AC
  Administered 2014-04-30 (×2): 30 mL
  Filled 2014-04-30 (×2): qty 30

## 2014-04-30 MED ORDER — DEXTROSE 5 % IV SOLN
1.0000 g | Freq: Three times a day (TID) | INTRAVENOUS | Status: DC
  Administered 2014-04-30 – 2014-05-01 (×3): 1 g via INTRAVENOUS
  Filled 2014-04-30 (×5): qty 1

## 2014-04-30 MED ORDER — ASPIRIN 81 MG PO CHEW
324.0000 mg | CHEWABLE_TABLET | Freq: Every day | ORAL | Status: DC
  Administered 2014-04-30 – 2014-05-01 (×2): 324 mg via ORAL
  Filled 2014-04-30 (×2): qty 4

## 2014-04-30 MED ORDER — MIDAZOLAM HCL 2 MG/2ML IJ SOLN
1.0000 mg | INTRAMUSCULAR | Status: DC | PRN
  Administered 2014-04-30 – 2014-05-01 (×6): 2 mg via INTRAVENOUS
  Filled 2014-04-30 (×6): qty 2

## 2014-04-30 MED ORDER — WARFARIN - PHARMACIST DOSING INPATIENT: Freq: Every day | Status: DC

## 2014-04-30 MED ORDER — VITAL AF 1.2 CAL PO LIQD
1000.0000 mL | ORAL | Status: DC
  Administered 2014-04-30: 1000 mL
  Filled 2014-04-30 (×7): qty 1000

## 2014-04-30 MED ORDER — MAGNESIUM SULFATE 40 MG/ML IJ SOLN: 2.0000 g | Freq: Once | INTRAMUSCULAR | Status: DC

## 2014-04-30 MED ORDER — VITAL HIGH PROTEIN PO LIQD
1000.0000 mL | ORAL | Status: DC
  Filled 2014-04-30 (×2): qty 1000

## 2014-04-30 MED ORDER — WARFARIN SODIUM 5 MG PO TABS
5.0000 mg | ORAL_TABLET | Freq: Once | ORAL | Status: AC
  Administered 2014-04-30: 5 mg via ORAL
  Filled 2014-04-30 (×2): qty 1

## 2014-04-30 NOTE — Progress Notes (Signed)
INITIAL NUTRITION ASSESSMENT  DOCUMENTATION CODES Per approved criteria  -Not Applicable   INTERVENTION:  Initiate TF via OGT with Vital AF 1.2 at 25 ml/h and Prostat 30 ml BID on day 1; on day 2, d/c Prostat and increase to goal rate of 70 ml/h (1680 ml per day) to provide 2016 kcals, 126 gm protein, 1362 ml free water daily.  NUTRITION DIAGNOSIS: Inadequate oral intake related to inability to eat as evidenced by NPO status.   Goal: Intake to meet >90% of estimated nutrition needs.  Monitor:  TF tolerance/adequacy, weight trend, labs, vent status.  Reason for Assessment: MD Consult for TF initiation and management.  68 y.o. male  Admitting Dx: Acute respiratory failure with hypoxia and hypercapnia  ASSESSMENT: 68 yo male with myeloproliferative d/o, h/o prior polysubstance abuse, Afib, h/o cardiomyopathy presents with resp failure.   Nutrition focused physical exam completed.  No muscle or subcutaneous fat depletion noticed.  Patient is currently intubated on ventilator support MV: 9.1 L/min Temp (24hrs), Avg:99.6 F (37.6 C), Min:98.1 F (36.7 C), Max:101.1 F (38.4 C)   Height: Ht Readings from Last 1 Encounters:  04/29/14 5\' 10"  (1.778 m)    Weight: Wt Readings from Last 1 Encounters:  04/30/14 184 lb 11.9 oz (83.8 kg)    Ideal Body Weight: 75.5 kg  % Ideal Body Weight: 111%  Wt Readings from Last 10 Encounters:  04/30/14 184 lb 11.9 oz (83.8 kg)  04/24/14 188 lb 8 oz (85.503 kg)  02/08/14 184 lb 6.4 oz (83.643 kg)  01/23/14 184 lb 14.4 oz (83.87 kg)  01/22/14 184 lb 6.4 oz (83.643 kg)  01/18/14 184 lb 3.2 oz (83.553 kg)  01/16/14 165 lb 2 oz (74.9 kg)  10/24/13 180 lb 12.8 oz (82.01 kg)  10/19/13 186 lb 3.2 oz (84.46 kg)  10/11/13 186 lb (84.369 kg)    Usual Body Weight: 180-186 lb  % Usual Body Weight: 100%  BMI:  Body mass index is 26.51 kg/(m^2).  Estimated Nutritional Needs: Kcal: 2046 Protein: 115-130 gm Fluid: 2.1 L  Skin:  intact  Diet Order: NPO  EDUCATION NEEDS: -No education needs identified at this time   Intake/Output Summary (Last 24 hours) at 04/30/14 1242 Last data filed at 04/30/14 1200  Gross per 24 hour  Intake 1066.75 ml  Output   1490 ml  Net -423.25 ml    Last BM: None documented since admission    Labs:   Recent Labs Lab 04/29/14 0330 04/29/14 0342 04/29/14 1105 04/30/14 0302  NA 140 140  --  145  K 4.5 4.3  --  3.9  CL 100 103  --  107  CO2 20  --   --  28  BUN 17 22  --  14  CREATININE 1.23 1.40* 0.95 0.98  CALCIUM 8.6  --   --  8.4  MG  --   --   --  1.7  PHOS  --   --   --  3.1  GLUCOSE 272* 268*  --  89    CBG (last 3)   Recent Labs  04/29/14 2042 04/30/14 0017 04/30/14 0704  GLUCAP 85 82 88    Scheduled Meds: . amiodarone  200 mg Oral Daily  . antiseptic oral rinse  7 mL Mouth Rinse QID  . aspirin  324 mg Oral Daily  . azithromycin  500 mg Intravenous Q24H  . cefTAZidime (FORTAZ)  IV  1 g Intravenous 3 times per day  . chlorhexidine  15 mL Mouth Rinse BID  . Chlorhexidine Gluconate Cloth  6 each Topical Q0600  . famotidine (PEPCID) IV  20 mg Intravenous Q12H  . feeding supplement (PRO-STAT SUGAR FREE 64)  30 mL Per Tube BID  . feeding supplement (VITAL HIGH PROTEIN)  1,000 mL Per Tube Q24H  . fentaNYL  50 mcg Intravenous Once  . heparin  5,000 Units Subcutaneous 3 times per day  . insulin aspart  2-6 Units Subcutaneous 6 times per day  . ipratropium-albuterol  3 mL Nebulization Q4H  . magnesium sulfate 1 - 4 g bolus IVPB  2 g Intravenous Once  . mupirocin ointment  1 application Nasal BID    Continuous Infusions: . fentaNYL infusion INTRAVENOUS 150 mcg/hr (04/30/14 1200)    Past Medical History  Diagnosis Date  . Cardiomyopathy     EF55% 11/14<<35%   . Substance abuse     alcohol  . Hypertension   . Atrial fibrillation /flutter     hx of flutter ablation 2/12  . Shortness of breath     "all the time lately" (12/14/2012)  . GERD  (gastroesophageal reflux disease)   . Hepatitis     "think I had that once; a long time ago; from dirty needles I think" (12/14/2012)  . 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 Leukocytosis, unspecified 06/12/2013  . 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 Obesity   . Syncope   . Syncope     Past Surgical History  Procedure Laterality Date  . Hemorrhoid surgery  1970's  . Cardiac electrophysiology mapping and ablation  08/2010    [name] 09/07/2010 (12/14/2012)  . Loop recorder implant  08-22-2013    MDT LinQ implanted by Dr Caryl Comes for syncope    Molli Barrows, [place], [place], [place] Pager [phone] After Hours Pager [phone]

## 2014-04-30 NOTE — Progress Notes (Signed)
[facility] ADULT ICU REPLACEMENT PROTOCOL FOR AM LAB REPLACEMENT ONLY  The patient does apply for the [facility] Adult ICU Electrolyte Replacment Protocol based on the criteria listed below:   1. Is GFR >/= 40 ml/min? Yes.    Patient's GFR today is >90 2. Is urine output >/= 0.5 ml/kg/hr for the last 6 hours? Yes.   Patient's UOP is 0.8 ml/kg/hr 3. Is BUN < 60 mg/dL? Yes.    Patient's BUN today is 14 4. Abnormal electrolyte(s): Mag 1.7 5. Ordered repletion with: per protocol 6. If a panic level lab has been reported, has the CCM MD in charge been notified? No..   Physician:    Ronda Fairly A 04/30/2014 6:34 AM

## 2014-04-30 NOTE — Progress Notes (Addendum)
ANTICOAGULATION CONSULT NOTE - Initial Consult  Pharmacy Consult for Coumadin Indication: atrial fibrillation  Allergies  Allergen Reactions  . Codeine Rash    Patient Measurements: Height: 5\' 10"  (177.8 cm) Weight: 184 lb 11.9 oz (83.8 kg) IBW/kg (Calculated) : 73  Vital Signs: Temp: 97.7 F (36.5 C) (10/12 1300) Temp Source: Core (Comment) (10/12 0400) BP: 122/58 mmHg (10/12 1300) Pulse Rate: 74 (10/12 1300)  Labs:  Recent Labs  04/29/14 0330 04/29/14 0342 04/29/14 1105 04/29/14 1643 04/29/14 2119 04/30/14 0302 04/30/14 1239  HGB 13.2 15.0 12.0*  --   --  11.0*  --   HCT 41.4 44.0 36.2*  --   --  33.1*  --   PLT 200  --  131*  --   --  127*  --   LABPROT  --   --  28.2*  --   --   --  20.9*  INR  --   --  2.64*  --   --   --  1.80*  CREATININE 1.23 1.40* 0.95  --   --  0.98  --   TROPONINI  --   --  <0.30 <0.30 <0.30  --   --     Estimated Creatinine Clearance: 76.6 ml/min (by C-G formula based on Cr of 0.98).   Medical History: Past Medical History  Diagnosis Date  . Cardiomyopathy     EF55% 11/14<<35%   . Substance abuse     alcohol  . Hypertension   . Atrial fibrillation /flutter     hx of flutter ablation 2/12  . Shortness of breath     "all the time lately" (12/14/2012)  . GERD (gastroesophageal reflux disease)   . Hepatitis     "think I had that once; a long time ago; from dirty needles I think" (12/14/2012)  . 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 Leukocytosis, unspecified 06/12/2013  . 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 Obesity   . Syncope   . Syncope     Medications:  Prescriptions prior to admission  Medication Sig Dispense Refill  . warfarin (COUMADIN) 5 MG tablet Take 2.5-5 mg by mouth See admin instructions. Take 1 tablet Monday-Friday. Take 0.5 tablet on Saturday and Sunday.      [name] albuterol (PROAIR HFA) 108 (90 BASE) MCG/ACT inhaler Inhale into the lungs daily as needed for wheezing or shortness of breath.      [name] amiodarone  (PACERONE) 200 MG tablet Take 1 tablet (200 mg total) by mouth daily.  90 tablet  0  . Budesonide (PULMICORT FLEXHALER) 90 MCG/ACT inhaler Inhale 1 puff into the lungs every evening.      . carvedilol (COREG) 12.5 MG tablet Take 1 tablet (12.5 mg total) by mouth 2 (two) times daily with a meal.  60 tablet  2  . hydroxyurea (HYDREA) 500 MG capsule take 3 capsules by mouth once daily *MAY TAKE WITH FOOD TO MINIMIZE GI SIDE EFFECTS  90 capsule  3    Assessment: 65 YOM with atrial fibrillation presented with respiratory failure on 10/11. Coumadin was held until INR was drawn. INR on 10/11 was therapeutic at 2.64, and INR today is 1.80. Hgb and platelets are trending down, also on aspirin 81 mg. His home Coumadin regimen is 5 mg Mon-Fri and 2.5 mg Sat/Sun. Pharmacy is consulted to manage Coumadin therapy during hospitalization.  Goal of Therapy:  INR 2-3 Monitor platelets by anticoagulation protocol: Yes   Plan:  -Coumadin 5 mg po x1 -Follow up daily INR,  signs of clot and bleeding. -Follow up CBC in AM  Whitney Muse, PharmD Candidate 04/30/2014,2:15 PM  I have reviewed and agree with above assessment and plan. Note patient is also on Amiodarone and Azithromycin which may affect his INR.  Will monitor for INR >2 and consider d/c of sq heparin at that time.   Sloan Leiter, PharmD, BCPS Clinical Pharmacist [phone] 04/30/2014, 2:33 PM

## 2014-04-30 NOTE — Progress Notes (Signed)
Patient extremely anxious on vent- bolused with 100 fentanyl and rate increased to 379mcg/hr. Anxiety persists, called [facility] to notify them of his sudden increased anxiety. PRN versed order placed- will continue to monitor patient.

## 2014-04-30 NOTE — Progress Notes (Signed)
PULMONARY / CRITICAL CARE MEDICINE HISTORY AND PHYSICAL EXAMINATION   Name: [name] MRN: [id number] DOB: [birth year]    ADMISSION DATE:  04/29/2014  PRIMARY SERVICE: PCCM  CHIEF COMPLAINT: Resp failure  BRIEF PATIENT DESCRIPTION: 67yo with myeloproliferative d/o, h/o prior polysubstance abuse, Afib, h/o cardiomyopathy presents with resp failure.    SIGNIFICANT EVENTS / STUDIES:  04/29/14:  Admitted, Intubated in ED 2/2, CT Chest with diffuse small bilateral areas of consolidation with bronchial wall thickening.  No PE  LINES / TUBES: ETT:  10/11--> Foley:  10/11-->  CULTURES: Blood cx: 10/11-->  ANTIBIOTICS: Vanc 10/11-->10/11 Cefipime 10/11-->10/11  Unasyn 10/11--> Azithro 10/11-->  SUBJECTIVE: doing OK, wants tube out, c/o pain in R neck  VITAL SIGNS: Temp:  [98.5 F (36.9 C)-101.1 F (38.4 C)] 98.8 F (37.1 C) (10/12 0900) Pulse Rate:  [55-85] 55 (10/12 0900) Resp:  [14-21] 18 (10/12 0900) BP: (101-134)/(46-72) 125/55 mmHg (10/12 0900) SpO2:  [98 %-100 %] 100 % (10/12 0900) FiO2 (%):  [40 %] 40 % (10/12 0823) Weight:  [184 lb 11.9 oz (83.8 kg)] 184 lb 11.9 oz (83.8 kg) (10/12 0446) HEMODYNAMICS:   VENTILATOR SETTINGS: Vent Mode:  [-] PRVC FiO2 (%):  [40 %] 40 % Set Rate:  [18 bmp] 18 bmp Vt Set:  [450 mL] 450 mL PEEP:  [5 cmH20] 5 cmH20 Plateau Pressure:  [18 cmH20-24 cmH20] 18 cmH20 INTAKE / OUTPUT: Intake/Output     10/11 0701 - 10/12 0700 10/12 0701 - 10/13 0700   I.V. (mL/kg) 1351.8 (16.1) 30 (0.4)   IV Piggyback 750    Total Intake(mL/kg) 2101.8 (25.1) 30 (0.4)   Urine (mL/kg/hr) 1765 (0.9) 100 (0.3)   Emesis/NG output 300 (0.1)    Total Output 2065 100   Net +36.8 -70          PHYSICAL EXAMINATION: General:  Obese, intubated, NAD Neuro: alert, following commands, RASS 0 HEENT:  ETT in place Neck: No JVD appreciated but limited by habitus Cardiovascular:  RRR, no gallops Lungs:  Wheezing diffuse, insp and exp   mild Abdomen:  +BS, No masses Musculoskeletal:  Normal tone Skin:  No rash  LABS:  CBC  Recent Labs Lab 04/29/14 0330 04/29/14 0342 04/29/14 1105 04/30/14 0302  WBC 15.9*  --  9.5 6.7  HGB 13.2 15.0 12.0* 11.0*  HCT 41.4 44.0 36.2* 33.1*  PLT 200  --  131* 127*   Coag's  Recent Labs Lab 04/29/14 1105  INR 2.64*   BMET  Recent Labs Lab 04/29/14 0330 04/29/14 0342 04/29/14 1105 04/30/14 0302  NA 140 140  --  145  K 4.5 4.3  --  3.9  CL 100 103  --  107  CO2 20  --   --  28  BUN 17 22  --  14  CREATININE 1.23 1.40* 0.95 0.98  GLUCOSE 272* 268*  --  89   Electrolytes  Recent Labs Lab 04/29/14 0330 04/30/14 0302  CALCIUM 8.6 8.4  MG  --  1.7  PHOS  --  3.1   Sepsis Markers  Recent Labs Lab 04/29/14 0347 04/29/14 0556  LATICACIDVEN 6.96* 1.23   ABG  Recent Labs Lab 04/29/14 0423 04/29/14 0602 04/30/14 0420  PHART 7.209* 7.329* 7.377  PCO2ART 71.6* 55.2* 46.5*  PO2ART 387.0* 115.0* 83.1   Liver Enzymes  Recent Labs Lab 04/29/14 0330  AST 27  ALT 13  ALKPHOS 103  BILITOT 0.6  ALBUMIN 3.2*   Cardiac Enzymes  Recent Labs Lab 04/29/14  0330 04/29/14 1105 04/29/14 1643 04/29/14 2119  TROPONINI  --  <0.30 <0.30 <0.30  PROBNP 2902.0*  --   --   --    Glucose  Recent Labs Lab 04/29/14 0842 04/29/14 1200 04/29/14 1556 04/29/14 2042 04/30/14 0017 04/30/14 0704  GLUCAP 83 105* 86 85 82 88    Imaging Ct Angio Chest W/cm &/or Wo Cm  04/29/2014   CLINICAL DATA:  Shortness of breath, found down, unresponsive. Intubation. History of possible bone cancer.  EXAM: CT ANGIOGRAPHY CHEST WITH CONTRAST  TECHNIQUE: Multidetector CT imaging of the chest was performed using the standard protocol during bolus administration of intravenous contrast. Multiplanar CT image reconstructions and MIPs were obtained to evaluate the vascular anatomy.  CONTRAST:  184mL OMNIPAQUE IOHEXOL 350 MG/ML SOLN  COMPARISON:  Chest radiograph April 29, 2014 and  CT angiogram of the chest August 22, 2013  FINDINGS: Adequate contrast opacification of the pulmonary artery's. Main pulmonary artery is not enlarged. No pulmonary arterial filling defects to the level of the subsegmental branches.  Heart and pericardium are unremarkable, no right heart strain. 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 Mild calcific atherosclerosis of the aortic arch. No lymphadenopathy by CT size criteria. Endotracheal tube tip terminates in mid trachea. Tracheobronchial tree is patent, no pneumothorax. Moderate bronchial wall thickening. Dense patchy consolidation RIGHT middle lobe, RIGHT lower lobe and to lesser extent LEFT lower lobe. Scattered ground-glass nodules in the RIGHT upper lobe.  Included view of the abdomen is non acute ; nasogastric tube looped in proximal stomach. 2 cm ovoid hypodensity in RIGHT thyroid gland, unchanged for which follow up thyroid sonogram could be performed on a nonemergent basis. Ovoid radiopaque foreign body within the LEFT anterior chest subcutaneous back consistent with battery pack/heart monitor. Severe degenerative changes included RIGHT shoulder.  Review of the MIP images confirms the above findings.  IMPRESSION: No acute pulmonary embolism.  Patchy consolidation in the bilateral lungs in addition to bronchial wall thickening concerning for bronchopneumonia, less likely aspiration.  Similar aneurysmal ascending thoracic aorta, not tailored for evaluation.   Electronically Signed   By: Elon Alas   On: 04/29/2014 05:03   Dg Chest Port 1 View  04/29/2014   CLINICAL DATA:  Endotracheal tube placement. Acute onset of shortness of breath. Initial encounter.  EXAM: PORTABLE CHEST - 1 VIEW  COMPARISON:  Chest radiograph performed 01/12/2014  FINDINGS: The patient's endotracheal tube is seen ending 4 cm above the carina. An enteric tube is noted extending below the diaphragm.  Medial bibasilar  airspace opacification raises concern for multifocal pneumonia. Mild interstitial edema might have a similar appearance. Mild vascular congestion is seen. No pleural effusion or pneumothorax is identified.  The cardiomediastinal silhouette is borderline normal in size. No acute osseous abnormalities are identified.  IMPRESSION: 1. Endotracheal tube seen ending 4 cm above the carina. 2. Medial bibasilar airspace opacification raises concern for multifocal pneumonia. Mild interstitial edema might have a similar appearance. 3. Mild vascular congestion noted.   Electronically Signed   By: Garald Balding M.D.   On: 04/29/2014 04:59    EKG: Sinus tach, slight ST depression in V4, V5 CXR: ETT in place, basilar opacities noted, worse in RLL  ASSESSMENT / PLAN:  Principal Problem:   Acute respiratory failure with hypoxia and hypercapnia Active Problems:   Atrial fibrillation   Acute respiratory failure with hypoxia and hypercarbia   PULMONARY A: Hypoxic/ hypercarbic resp failure Likely undx COPD  given exam with severe wheeze R/o PNA, cpa P:   -Cont MV -VAP bundle -Daily awakenings -ABg keep same MV Wean cpap 5 ps 5, goal 1 hr, failed, may need further sedation reduction and PS elevation  CARDIOVASCULAR A: H/o cardiomyopathy (??from ETOH) but last echo in 11/14 with normal EF Afib Slight ST depression in V4, V5, upsloping, likely rate related Neg trop  P:   Cont home coreg and amio TTE Asa 325 given INR 2.64, coum per pharmacy  RENAL A: Elevated CR-improved Hypomag P:   Foley in place Goal euvolemia while awaiting TTE Monitor UOP mag 2g per ELINK 1 dose  GASTROINTESTINAL A: Need for nutrition P:   Start TF if not extubated, pepup Nutrition c/s OG in place pepcid  HEMATOLOGIC A: Myeloproliferative d/o anticoagualtion P:   Trend CBC Holding home hydroxyurea for now coum per pharm  INFECTIOUS [place] 10/11>> BC 10/11>> RVP 10/11>> MRSA 10/11>> pos  A A: Aspiration PNA vs viral resp infection Immunocompromise host P:   unasyn 10/11>>10/12 ceftaz 10/12>>> azithro 10/11>> Trend CXR Flu awaited, will not start empiric tamiflu, no known cases as of now in [place] If drops BP , add vanc  ENDOCRINE A: Hyperglycemia, suspect undx DM P:   Checking A1C SSI  NEUROLOGIC A: Need for sedation H/o polysubstance abuse P:   Fentanyl gtt, target RASS 0 to -1 Utox pending   TODAY'S SUMMARY: change abx, weaning with PS increase, feed him  I have personally obtained a history, examined the patient, evaluated laboratory and imaging results, formulated the assessment and plan and placed orders.  CRITICAL CARE: The patient is critically ill with multiple organ systems failure and requires high complexity decision making for assessment and support, frequent evaluation and titration of therapies, application of advanced monitoring technologies and extensive interpretation of multiple databases. Critical Care Time devoted to patient care services described in this note is 30 minutes.    04/30/2014, 11:05 AM  Lavon Paganini. Titus Mould, MD, [place] Pgr: [place] [facility]

## 2014-05-01 ENCOUNTER — Inpatient Hospital Stay (HOSPITAL_COMMUNITY): Payer: Medicare Other

## 2014-05-01 LAB — BASIC METABOLIC PANEL
Anion gap: 8 (ref 5–15)
BUN: 20 mg/dL (ref 6–23)
CO2: 29 mEq/L (ref 19–32)
Calcium: 8.3 mg/dL — ABNORMAL LOW (ref 8.4–10.5)
Chloride: 104 mEq/L (ref 96–112)
Creatinine, Ser: 0.88 mg/dL (ref 0.50–1.35)
GFR calc Af Amer: >90 mL/min (ref >90)
GFR, EST NON AFRICAN AMERICAN: 88 mL/min — AB (ref >90)
Glucose, Bld: 100 mg/dL — ABNORMAL HIGH (ref 70–99)
Potassium: 3.5 mEq/L — ABNORMAL LOW (ref 3.7–5.3)
Sodium: 141 mEq/L (ref 137–147)

## 2014-05-01 LAB — URINE DRUGS OF ABUSE SCREEN W ALC, ROUTINE (REF LAB)
Amphetamine Screen, Ur: NEGATIVE
BENZODIAZEPINES.: NEGATIVE
Barbiturate Quant, Ur: NEGATIVE
COCAINE METABOLITES: NEGATIVE
Creatinine,U: 84.5 mg/dL
Ethyl Alcohol: <10 mg/dL (ref <10)
METHADONE: POSITIVE — AB
Marijuana Metabolite: NEGATIVE
OPIATE SCREEN, URINE: NEGATIVE
Phencyclidine (PCP): NEGATIVE
Propoxyphene: NEGATIVE

## 2014-05-01 LAB — GLUCOSE, CAPILLARY
GLUCOSE-CAPILLARY: 99 mg/dL (ref 70–99)
GLUCOSE-CAPILLARY: 122 mg/dL — AB (ref 70–99)
GLUCOSE-CAPILLARY: 98 mg/dL (ref 70–99)
GLUCOSE-CAPILLARY: 125 mg/dL — AB (ref 70–99)
Glucose-Capillary: 91 mg/dL (ref 70–99)
Glucose-Capillary: 95 mg/dL (ref 70–99)

## 2014-05-01 LAB — CBC WITH DIFFERENTIAL/PLATELET
Basophils Absolute: 0 10*3/uL (ref 0.0–0.1)
Basophils Relative: 0 % (ref 0–1)
EOS ABS: 0.1 10*3/uL (ref 0.0–0.7)
Eosinophils Relative: 2 % (ref 0–5)
HCT: 31.7 % — ABNORMAL LOW (ref 39.0–52.0)
Hemoglobin: 10.4 g/dL — ABNORMAL LOW (ref 13.0–17.0)
Lymphocytes Relative: 16 % (ref 12–46)
Lymphs Abs: 0.8 10*3/uL (ref 0.7–4.0)
MCH: 41.3 pg — ABNORMAL HIGH (ref 26.0–34.0)
MCHC: 32.8 g/dL (ref 30.0–36.0)
MCV: 125.8 fL — ABNORMAL HIGH (ref 78.0–100.0)
Monocytes Absolute: 0.7 10*3/uL (ref 0.1–1.0)
Monocytes Relative: 14 % — ABNORMAL HIGH (ref 3–12)
NEUTROS ABS: 3.3 10*3/uL (ref 1.7–7.7)
Neutrophils Relative %: 68 % (ref 43–77)
Platelets: 117 10*3/uL — ABNORMAL LOW (ref 150–400)
RBC: 2.52 MIL/uL — ABNORMAL LOW (ref 4.22–5.81)
RDW: 13.9 % (ref 11.5–15.5)
WBC: 4.9 10*3/uL (ref 4.0–10.5)

## 2014-05-01 LAB — MAGNESIUM
MAGNESIUM: 1.9 mg/dL (ref 1.5–2.5)
MAGNESIUM: 1.9 mg/dL (ref 1.5–2.5)

## 2014-05-01 LAB — CULTURE, RESPIRATORY

## 2014-05-01 LAB — PHOSPHORUS
PHOSPHORUS: 2.5 mg/dL (ref 2.3–4.6)
PHOSPHORUS: 3.3 mg/dL (ref 2.3–4.6)

## 2014-05-01 LAB — PROTIME-INR
INR: 1.75 — ABNORMAL HIGH (ref 0.00–1.49)
Prothrombin Time: 20.4 seconds — ABNORMAL HIGH (ref 11.6–15.2)

## 2014-05-01 MED ORDER — METHADONE HCL 10 MG PO TABS
10.0000 mg | ORAL_TABLET | Freq: Two times a day (BID) | ORAL | Status: DC
  Administered 2014-05-01 – 2014-05-03 (×5): 10 mg via ORAL
  Filled 2014-05-01 (×5): qty 1

## 2014-05-01 MED ORDER — CETYLPYRIDINIUM CHLORIDE 0.05 % MT LIQD
7.0000 mL | Freq: Two times a day (BID) | OROMUCOSAL | Status: DC
  Administered 2014-05-01 – 2014-05-02 (×2): 7 mL via OROMUCOSAL

## 2014-05-01 MED ORDER — WARFARIN SODIUM 5 MG PO TABS
5.0000 mg | ORAL_TABLET | Freq: Once | ORAL | Status: AC
  Administered 2014-05-01: 5 mg via ORAL
  Filled 2014-05-01: qty 1

## 2014-05-01 MED ORDER — IPRATROPIUM-ALBUTEROL 0.5-2.5 (3) MG/3ML IN SOLN
3.0000 mL | Freq: Three times a day (TID) | RESPIRATORY_TRACT | Status: DC
  Administered 2014-05-01 – 2014-05-03 (×6): 3 mL via RESPIRATORY_TRACT
  Filled 2014-05-01 (×6): qty 3

## 2014-05-01 MED ORDER — POTASSIUM CHLORIDE 20 MEQ/15ML (10%) PO LIQD
20.0000 meq | ORAL | Status: AC
  Administered 2014-05-01 (×2): 20 meq
  Filled 2014-05-01 (×2): qty 15

## 2014-05-01 MED ORDER — ASPIRIN 81 MG PO CHEW
81.0000 mg | CHEWABLE_TABLET | Freq: Every day | ORAL | Status: DC
  Administered 2014-05-02 – 2014-05-03 (×2): 81 mg via ORAL
  Filled 2014-05-01 (×2): qty 1

## 2014-05-01 MED ORDER — DEXTROSE 5 % IV SOLN
1.0000 g | INTRAVENOUS | Status: AC
  Administered 2014-05-01 – 2014-05-03 (×3): 1 g via INTRAVENOUS
  Filled 2014-05-01 (×3): qty 10

## 2014-05-01 MED ORDER — FAMOTIDINE 20 MG PO TABS
20.0000 mg | ORAL_TABLET | Freq: Two times a day (BID) | ORAL | Status: DC
Start: 2014-05-01 — End: 2014-05-02
  Administered 2014-05-01: 20 mg via ORAL
  Filled 2014-05-01 (×3): qty 1

## 2014-05-01 NOTE — Progress Notes (Signed)
[facility] ADULT ICU REPLACEMENT PROTOCOL FOR AM LAB REPLACEMENT ONLY  The patient does apply for the [facility] Adult ICU Electrolyte Replacment Protocol based on the criteria listed below:   1. Is GFR >/= 40 ml/min? Yes.    Patient's GFR today is 88 2. Is urine output >/= 0.5 ml/kg/hr for the last 6 hours? Yes.   Patient's UOP is 0.8 ml/kg/hr 3. Is BUN < 60 mg/dL? Yes.    Patient's BUN today is 20 4. Abnormal electrolyte(s):  K 3.5 5. Ordered repletion with: per protocol  If a panic level lab has been reported, has the CCM MD in charge been notified? No..   Physician:    Ronda Fairly A 05/01/2014 5:14 AM

## 2014-05-01 NOTE — Progress Notes (Signed)
Wasted fentanyl drip 175 ml out of a 250 ml bag in the sink with Marnee Spring, RN.

## 2014-05-01 NOTE — Progress Notes (Signed)
ANTICOAGULATION CONSULT NOTE - Follow-up Consult  Pharmacy Consult for Coumadin Indication: atrial fibrillation  Allergies  Allergen Reactions  . Codeine Rash    Patient Measurements: Height: 5\' 10"  (177.8 cm) Weight: 189 lb 9.5 oz (86 kg) IBW/kg (Calculated) : 73  Vital Signs: Temp: 98.9 F (37.2 C) (10/13 0828) Temp Source: Core (Comment) (10/13 0800) BP: 122/73 mmHg (10/13 0828) Pulse Rate: 88 (10/13 0828)  Labs:  Recent Labs  04/29/14 1105 04/29/14 1643 04/29/14 2119 04/30/14 0302 04/30/14 1239 05/01/14 0030  HGB 12.0*  --   --  11.0*  --  10.4*  HCT 36.2*  --   --  33.1*  --  31.7*  PLT 131*  --   --  127*  --  117*  LABPROT 28.2*  --   --   --  20.9* 20.4*  INR 2.64*  --   --   --  1.80* 1.75*  CREATININE 0.95  --   --  0.98  --  0.88  TROPONINI <0.30 <0.30 <0.30  --   --   --     Estimated Creatinine Clearance: 85.3 ml/min (by C-G formula based on Cr of 0.88).  Assessment: 56 YOM with atrial fibrillation presented with respiratory failure on 10/11. Coumadin was held until INR was drawn. INR on 10/11 was therapeutic at 2.64. Coumadin was restarted on 10/12 at 5 mg and INR today is 1.75. Hgb and platelets are trending down, also on aspirin 81 mg and SQ heparin for VTE prophylaxis. His home Coumadin regimen is 5 mg Mon-Fri and 2.5 mg Sat/Sun. Patient is also on his home amiodarone.   Goal of Therapy:  INR 2-3 Monitor platelets by anticoagulation protocol: Yes   Plan:  -Coumadin 5 mg po x1 -Once INR>2, consider discontinuing SQ heparin. -Follow up daily INR, signs of clot and bleeding. -Follow up CBC in AM  Whitney Muse, PharmD Candidate 05/01/2014,10:09 AM  I agree with above assessment and plan.  Sherlon Handing, PharmD, BCPS Clinical pharmacist, pager [phone] 05/01/2014 3:39 PM

## 2014-05-01 NOTE — Procedures (Signed)
Extubation Procedure Note  Patient Details:   Name: [name] DOB: 07/18/1946 MRN: [id number]   Airway Documentation:     Evaluation  O2 sats: stable throughout Complications: No apparent complications Patient did tolerate procedure well. Bilateral Breath Sounds: Expiratory wheezes Suctioning: Airway Yes  Patient extubated to 4L nasal cannula.  Positive cuff leak.  Patient able to speak after extubation.  Patient able to cough up secretions.  No evidence of stridor, although has some expiratory wheezing.  Was given scheduled DuoNeb.  Sats currently 99%.  Vitals are stable.  Alphia Moh N 05/01/2014, 11:58 AM

## 2014-05-01 NOTE — Plan of Care (Signed)
Problem: Phase II Progression Outcomes Goal: Date pt extubated/weaned off vent Outcome: Completed/Met Date Met:  05/01/14 Extubated 05/01/14 Goal: Time pt extubated/weaned off vent Outcome: Completed/Met Date Met:  05/01/14 05/01/14 at 1150

## 2014-05-01 NOTE — Progress Notes (Signed)
PULMONARY / CRITICAL CARE MEDICINE HISTORY AND PHYSICAL EXAMINATION   Name: [name] MRN: [id number] DOB: 07-11-1946    ADMISSION DATE:  04/29/2014  PRIMARY SERVICE: PCCM  CHIEF COMPLAINT: Resp failure  BRIEF PATIENT DESCRIPTION: 67yo with myeloproliferative d/o, h/o prior polysubstance abuse, Afib, h/o cardiomyopathy presents with resp failure.    SIGNIFICANT EVENTS / STUDIES:  04/29/14:  Admitted, Intubated in ED 2/2, CT Chest with diffuse small bilateral areas of consolidation with bronchial wall thickening.  No PE 10/12 echo- EF 50-55%, pa peak pressure 38  LINES / TUBES: ETT:  10/11--> Foley:  10/11-->  CULTURES: Blood cx: 10/11--> Rhino pos  ANTIBIOTICS: Vanc 10/11-->10/11 Cefipime 10/11-->10/11  Unasyn 10/11--> Azithro 10/11-->  SUBJECTIVE: pt was CAM negative, RASS 0, following commands, however became agitated and combative requiring versed x 2, weaning  VITAL SIGNS: Filed Vitals:   05/01/14 0700 05/01/14 0800 05/01/14 0828 05/01/14 0830  BP: 111/59 122/73 122/73   Pulse: 59 72 88   Temp: 98.3 F (36.8 C) 94.6 F (34.8 C) 98.9 F (37.2 C)   TempSrc:  Core (Comment)    Resp: 18 18 16    Height:      Weight:      SpO2: 94% 98% 97% 97%   HEMODYNAMICS:   VENTILATOR SETTINGS: Vent Mode:  [-] PSV;CPAP FiO2 (%):  [30 %-40 %] 30 % Set Rate:  [18 bmp] 18 bmp Vt Set:  [450 mL] 450 mL PEEP:  [5 cmH20] 5 cmH20 Pressure Support:  [5 cmH20] 5 cmH20 Plateau Pressure:  [18 cmH20-23 cmH20] 23 cmH20  INTAKE / OUTPUT: I/O last 3 completed shifts: In: 2000.5 [I.V.:735.5; NG/GT:515; IV Piggyback:750] Out: 9628 [Urine:1565; Emesis/NG output:150] Total I/O In: 88 [NG/GT:88] Out: 41 [Urine:65]     PHYSICAL EXAMINATION: General:  Obese, intubated, sedated, NAD Neuro:RASS -2 HEENT:  ETT in place Neck: No JVD appreciated but limited by habitus Cardiovascular:  RRR, no gallops Lungs:  Wheezing diffuse, exo mild Abdomen:  Hypo BS, No  masses Musculoskeletal:  Normal tone Skin:  No rash  LABS:  CBC CBC Latest Ref Rng 05/01/2014 04/30/2014 04/29/2014  WBC 4.0 - 10.5 K/uL 4.9 6.7 9.5  Hemoglobin 13.0 - 17.0 g/dL 10.4(L) 11.0(L) 12.0(L)  Hematocrit 39.0 - 52.0 % 31.7(L) 33.1(L) 36.2(L)  Platelets 150 - 400 K/uL 117(L) 127(L) 131(L)    Coag's Recent Labs Lab 04/29/14 1105  INR 2.64*   BMET BMET    Component Value Date/Time   NA 141 05/01/2014 0030   NA 143 06/12/2013 1125   K 3.5* 05/01/2014 0030   K 3.7 06/12/2013 1125   CL 104 05/01/2014 0030   CO2 29 05/01/2014 0030   CO2 32* 06/12/2013 1125   GLUCOSE 100* 05/01/2014 0030   GLUCOSE 92 06/12/2013 1125   BUN 20 05/01/2014 0030   BUN 14.6 06/12/2013 1125   CREATININE 0.88 05/01/2014 0030   CREATININE 0.98 01/22/2014 1620   CREATININE 0.9 06/12/2013 1125   CALCIUM 8.3* 05/01/2014 0030   CALCIUM 8.9 06/12/2013 1125   GFRNONAA 88* 05/01/2014 0030   GFRNONAA 80 01/22/2014 1620   GFRAA >90 05/01/2014 0030   GFRAA >89 01/22/2014 1620    Electrolytes  Recent Labs Lab 04/29/14 0330 04/30/14 0302  CALCIUM 8.6 8.4  MG  --  1.7  PHOS  --  3.1   Sepsis Markers  Recent Labs Lab 04/29/14 0347 04/29/14 0556  LATICACIDVEN 6.96* 1.23   ABG  Recent Labs Lab 04/29/14 0423 04/29/14 0602 04/30/14 0420  PHART 7.209* 7.329*  7.377  PCO2ART 71.6* 55.2* 46.5*  PO2ART 387.0* 115.0* 83.1   Liver Enzymes  Recent Labs Lab 04/29/14 0330  AST 27  ALT 13  ALKPHOS 103  BILITOT 0.6  ALBUMIN 3.2*   Cardiac Enzymes  Recent Labs Lab 04/29/14 0330 04/29/14 1105 04/29/14 1643 04/29/14 2119  TROPONINI  --  <0.30 <0.30 <0.30  PROBNP 2902.0*  --   --   --    Glucose  Recent Labs Lab 04/29/14 0842 04/29/14 1200 04/29/14 1556 04/29/14 2042 04/30/14 0017 04/30/14 0704  GLUCAP 83 105* 86 85 82 88    Imaging Ct Angio Chest W/cm &/or Wo Cm  04/29/2014   CLINICAL DATA:  Shortness of breath, found down, unresponsive. Intubation. History of  possible bone cancer.  EXAM: CT ANGIOGRAPHY CHEST WITH CONTRAST  TECHNIQUE: Multidetector CT imaging of the chest was performed using the standard protocol during bolus administration of intravenous contrast. Multiplanar CT image reconstructions and MIPs were obtained to evaluate the vascular anatomy.  CONTRAST:  18mL OMNIPAQUE IOHEXOL 350 MG/ML SOLN  COMPARISON:  Chest radiograph April 29, 2014 and CT angiogram of the chest August 22, 2013  FINDINGS: Adequate contrast opacification of the pulmonary artery's. Main pulmonary artery is not enlarged. No pulmonary arterial filling defects to the level of the subsegmental branches.  Heart and pericardium are unremarkable, no right heart strain. 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 Mild calcific atherosclerosis of the aortic arch. No lymphadenopathy by CT size criteria. Endotracheal tube tip terminates in mid trachea. Tracheobronchial tree is patent, no pneumothorax. Moderate bronchial wall thickening. Dense patchy consolidation RIGHT middle lobe, RIGHT lower lobe and to lesser extent LEFT lower lobe. Scattered ground-glass nodules in the RIGHT upper lobe.  Included view of the abdomen is non acute ; nasogastric tube looped in proximal stomach. 2 cm ovoid hypodensity in RIGHT thyroid gland, unchanged for which follow up thyroid sonogram could be performed on a nonemergent basis. Ovoid radiopaque foreign body within the LEFT anterior chest subcutaneous back consistent with battery pack/heart monitor. Severe degenerative changes included RIGHT shoulder.  Review of the MIP images confirms the above findings.  IMPRESSION: No acute pulmonary embolism.  Patchy consolidation in the bilateral lungs in addition to bronchial wall thickening concerning for bronchopneumonia, less likely aspiration.  Similar aneurysmal ascending thoracic aorta, not tailored for evaluation.   Electronically Signed   By: Elon Alas    On: 04/29/2014 05:03   Dg Chest Port 1 View  04/29/2014   CLINICAL DATA:  Endotracheal tube placement. Acute onset of shortness of breath. Initial encounter.  EXAM: PORTABLE CHEST - 1 VIEW  COMPARISON:  Chest radiograph performed 01/12/2014  FINDINGS: The patient's endotracheal tube is seen ending 4 cm above the carina. An enteric tube is noted extending below the diaphragm.  Medial bibasilar airspace opacification raises concern for multifocal pneumonia. Mild interstitial edema might have a similar appearance. Mild vascular congestion is seen. No pleural effusion or pneumothorax is identified.  The cardiomediastinal silhouette is borderline normal in size. No acute osseous abnormalities are identified.  IMPRESSION: 1. Endotracheal tube seen ending 4 cm above the carina. 2. Medial bibasilar airspace opacification raises concern for multifocal pneumonia. Mild interstitial edema might have a similar appearance. 3. Mild vascular congestion noted.   Electronically Signed   By: Garald Balding M.D.   On: 04/29/2014 04:59   DG chest port 1 view  05/01/14  CLINICAL DATA: Pneumonia. EXAM: PORTABLE CHEST - 1 VIEW  COMPARISON: 04/29/2014 and 01/12/2014 and chest CT dated 04/29/2014 FINDINGS: Endotracheal tube and NG tube appear good position. Heart size and pulmonary vascularity are normal. Patchy infiltrates at the lung bases have resolved on the right and almost resolved at the left base. No acute osseous abnormality. IMPRESSION: Clearing of the infiltrates at the right base. Minimal residual infiltrate on the left. Electronically Signed By: Rozetta Nunnery M.D. On: 05/01/2014 07:19   EKG: Sinus tach, slight ST depression in V4, V5 CXR: Improved int prominence  ASSESSMENT / PLAN:  Principal Problem:   Acute respiratory failure with hypoxia and hypercapnia Active Problems:   Atrial fibrillation   Acute respiratory failure with hypoxia and hypercarbia  PULMONARY A: Hypoxic/ hypercarbic resp failure Likely  undx COPD given exam with severe wheeze R/o PNA, cpa P:   -Cont MV -VAP bundle -WUA -Wean cpap 5 ps 5, goal 1 hr,assess rsbi  CARDIOVASCULAR A: H/o cardiomyopathy (??from ETOH) but last echo in 11/14 with normal EF Afib No evidence of ischemia on echo  P:   Cont home coreg and amio Asa 325 given coum per pharmacy  RENAL A: Elevated CR-improved Hypomag-resolved Hypokalemia P:   Foley in place Goal euvolemia Monitor UOP KCl  GASTROINTESTINAL A: Need for nutrition P:   TF, pepup Nutrition c/s OG in place pepcid  HEMATOLOGIC A: Myeloproliferative d/o anticoagualtion P:   Trend CBC Holding home hydroxyurea for now, evaluating diff on hydroxy, in setting pna, hold further, also given cbc noted  coum per pharm  INFECTIOUS [place] 10/11>>neg BC 10/11>> RVP 10/11>>rhinovirus MRSA 10/11>> pos A A: Aspiration PNA vs viral resp infection Immunocompromise host>?  Rhinovirus R/o super infection P:   unasyn 10/11>>10/12 ceftaz 10/12>>>10/13 azithro 10/11>>10/13  Ceftriaxone 10/13>>> Trend CXR No role antiviral at this stage  ENDOCRINE A: Hyperglycemia, suspect undx DM P:   Checking A1C SSI  NEUROLOGIC A: Need for sedation H/o polysubstance abuse P:   Fentanyl gtt, target RASS 0 to -1 Utox pending still If not extubated, transition to propofol agitation due to withdrawal?   TODAY'S SUMMARY: agitated and combative, required sedation, weaning, if not extubated to propofol  I have personally obtained a history, examined the patient, evaluated laboratory and imaging results, formulated the assessment and plan and placed orders.  CRITICAL CARE: The patient is critically ill with multiple organ systems failure and requires high complexity decision making for assessment and support, frequent evaluation and titration of therapies, application of advanced monitoring technologies and extensive interpretation of multiple databases. Critical Care Time devoted  to patient care services described in this note is 30 minutes.   Lavon Paganini. Titus Mould, MD, [place] Pgr: [place] [facility]

## 2014-05-01 NOTE — Progress Notes (Signed)
Patient educated this morning about isolation procedures:contact-gown and mask for positive MRSA PCR swab and droplet precautions to rule out flu.  Patient was intubated at the time and on sedation.  After extubation, the patient was re-educated on contact precautions and droplet precautions. The patient had a negative flu result and the droplet precautions were discontinued after consulting with the infection prevention nurse-Cherrie.  His significant other was educated over the phone about the precautions and the use of protective  equipment.   Zadin Lange, Lauralyn Primes, RN

## 2014-05-02 LAB — BASIC METABOLIC PANEL
Anion gap: 10 (ref 5–15)
BUN: 18 mg/dL (ref 6–23)
CHLORIDE: 102 meq/L (ref 96–112)
CO2: 27 meq/L (ref 19–32)
Calcium: 8.4 mg/dL (ref 8.4–10.5)
Creatinine, Ser: 0.92 mg/dL (ref 0.50–1.35)
GFR calc Af Amer: >90 mL/min (ref >90)
GFR calc non Af Amer: 86 mL/min — ABNORMAL LOW (ref >90)
Glucose, Bld: 92 mg/dL (ref 70–99)
POTASSIUM: 4.1 meq/L (ref 3.7–5.3)
Sodium: 139 mEq/L (ref 137–147)

## 2014-05-02 LAB — GLUCOSE, CAPILLARY
GLUCOSE-CAPILLARY: 98 mg/dL (ref 70–99)
Glucose-Capillary: 123 mg/dL — ABNORMAL HIGH (ref 70–99)
Glucose-Capillary: 116 mg/dL — ABNORMAL HIGH (ref 70–99)
Glucose-Capillary: 141 mg/dL — ABNORMAL HIGH (ref 70–99)

## 2014-05-02 LAB — PROTIME-INR
INR: 1.87 — ABNORMAL HIGH (ref 0.00–1.49)
Prothrombin Time: 21.5 seconds — ABNORMAL HIGH (ref 11.6–15.2)

## 2014-05-02 LAB — PHOSPHORUS
PHOSPHORUS: 3.1 mg/dL (ref 2.3–4.6)
Phosphorus: 3.3 mg/dL (ref 2.3–4.6)

## 2014-05-02 LAB — CBC
HCT: 31.2 % — ABNORMAL LOW (ref 39.0–52.0)
Hemoglobin: 10.4 g/dL — ABNORMAL LOW (ref 13.0–17.0)
MCH: 40.6 pg — ABNORMAL HIGH (ref 26.0–34.0)
MCHC: 33.3 g/dL (ref 30.0–36.0)
MCV: 121.9 fL — ABNORMAL HIGH (ref 78.0–100.0)
PLATELETS: 119 10*3/uL — AB (ref 150–400)
RBC: 2.56 MIL/uL — AB (ref 4.22–5.81)
RDW: 13.2 % (ref 11.5–15.5)
WBC: 3.9 10*3/uL — AB (ref 4.0–10.5)

## 2014-05-02 LAB — MAGNESIUM
MAGNESIUM: 1.8 mg/dL (ref 1.5–2.5)
Magnesium: 1.8 mg/dL (ref 1.5–2.5)

## 2014-05-02 LAB — HIV ANTIBODY (ROUTINE TESTING W REFLEX): HIV 1&2 Ab, 4th Generation: NONREACTIVE

## 2014-05-02 MED ORDER — PNEUMOCOCCAL VAC POLYVALENT 25 MCG/0.5ML IJ INJ: 0.5000 mL | INJECTION | INTRAMUSCULAR | Status: DC | PRN

## 2014-05-02 MED ORDER — METHYLPREDNISOLONE SODIUM SUCC 40 MG IJ SOLR
40.0000 mg | Freq: Two times a day (BID) | INTRAMUSCULAR | Status: DC
  Administered 2014-05-02 – 2014-05-03 (×3): 40 mg via INTRAVENOUS
  Filled 2014-05-02 (×4): qty 1

## 2014-05-02 MED ORDER — HYDROXYUREA 500 MG PO CAPS: 500.0000 mg | ORAL_CAPSULE | Freq: Every day | ORAL | Status: DC

## 2014-05-02 MED ORDER — WARFARIN SODIUM 5 MG PO TABS
5.0000 mg | ORAL_TABLET | Freq: Once | ORAL | Status: AC
  Administered 2014-05-02: 5 mg via ORAL
  Filled 2014-05-02 (×2): qty 1

## 2014-05-02 NOTE — Progress Notes (Signed)
ANTICOAGULATION CONSULT NOTE - [place] for Coumadin Indication: atrial fibrillation  Allergies  Allergen Reactions  . Codeine Rash    Patient Measurements: Height: 5\' 10"  (177.8 cm) Weight: 191 lb 12.8 oz (87 kg) IBW/kg (Calculated) : 73  Vital Signs: Temp: 98 F (36.7 C) (10/14 1138) Temp Source: Oral (10/14 1138) BP: 131/60 mmHg (10/14 0700) Pulse Rate: 70 (10/14 1000)  Labs:  Recent Labs  04/29/14 1643 04/29/14 2119  04/30/14 0302 04/30/14 1239 05/01/14 0030 05/02/14 0215 05/02/14 1145  HGB  --   --   < > 11.0*  --  10.4*  --  10.4*  HCT  --   --   --  33.1*  --  31.7*  --  31.2*  PLT  --   --   --  127*  --  117*  --  119*  LABPROT  --   --   --   --  20.9* 20.4* 21.5*  --   INR  --   --   --   --  1.80* 1.75* 1.87*  --   CREATININE  --   --   --  0.98  --  0.88 0.92  --   TROPONINI <0.30 <0.30  --   --   --   --   --   --   < > = values in this interval not displayed.  Estimated Creatinine Clearance: 81.6 ml/min (by C-G formula based on Cr of 0.92).  Assessment: 64 YOM with atrial fibrillation presented with respiratory failure on 10/11. Coumadin was held until INR was drawn. INR on 10/11 was therapeutic at 2.64. Coumadin was restarted on 10/12 at 5 mg and INR today is 1.87. Hgb and platelets are down but stable, also on aspirin 81 mg. His home Coumadin regimen is 5 mg Mon-Fri and 2.5 mg Sat/Sun. Patient is also on his home amiodarone.   Goal of Therapy:  INR 2-3 Monitor platelets by anticoagulation protocol: Yes   Plan:  -Coumadin 5 mg po x1. -Follow up daily INR, signs of clot and bleeding. -Follow up CBC in AM  Whitney Muse, PharmD Candidate 05/02/2014,12:29 PM  I have reviewed and agree with above assessment and plan.  Sloan Leiter, PharmD, BCPS Clinical Pharmacist [phone] 05/02/2014, 1:46 PM

## 2014-05-02 NOTE — Progress Notes (Signed)
PULMONARY / CRITICAL CARE MEDICINE HISTORY AND PHYSICAL EXAMINATION   Name: [name] MRN: [id number] DOB: 28-Jul-1945    ADMISSION DATE:  04/29/2014  PRIMARY SERVICE: PCCM  CHIEF COMPLAINT: Resp failure  BRIEF PATIENT DESCRIPTION: 68yo with myeloproliferative d/o, h/o prior polysubstance abuse, Afib, h/o cardiomyopathy presents with resp failure.    SIGNIFICANT EVENTS / STUDIES:  04/29/14:  Admitted, Intubated in ED 2/2, CT Chest with diffuse small bilateral areas of consolidation with bronchial wall thickening.  No PE 10/12 echo- EF 50-55%, pa peak pressure 38  LINES / TUBES: ETT:  10/11-->10/13 Foley:  10/11-->10/13  CULTURES: Blood cx: 10/11--> Rhino pos  ANTIBIOTICS: Vanc 10/11-->10/11 Cefipime 10/11-->10/11 Unasyn 10/11-->10/13 Azithro 10/11-->10/13  Ceftriaxone 10/13>>  SUBJECTIVE: overnight events: brief period of afib, tries to get up on own, pt reports feeling well, min SOB, occasional cough  VITAL SIGNS: Filed Vitals:   05/02/14 0300 05/02/14 0357 05/02/14 0600 05/02/14 0700  BP: 125/75  155/72 131/60  Pulse: 69  71 64  Temp:  97.7 F (36.5 C)    TempSrc:  Oral    Resp: 25  18 7   Height:      Weight:   191 lb 12.8 oz (87 kg)   SpO2: 100%  100% 99%    HEMODYNAMICS:   VENTILATOR SETTINGS:   INTAKE / OUTPUT: I/O last 3 completed shifts: In: 2289.4 [P.O.:480; I.V.:441.4; NG/GT:768; IV Piggyback:600] Out: 1520 [Urine:1520]    PHYSICAL EXAMINATION: General:  Obese male sitting on edge of bed now in a chair,  NAD Neuro:alert and oriented HEENT:  No JVD Cardiovascular:  RRR, s1s2 Lungs:  Wheezing diffuse, exo mild Abdomen:  + BS, soft, non-tender Musculoskeletal:  Normal tone Skin:  No rash  LABS:  CBC CBC Latest Ref Rng 05/01/2014 04/30/2014 04/29/2014  WBC 4.0 - 10.5 K/uL 4.9 6.7 9.5  Hemoglobin 13.0 - 17.0 g/dL 10.4(L) 11.0(L) 12.0(L)  Hematocrit 39.0 - 52.0 % 31.7(L) 33.1(L) 36.2(L)  Platelets 150 - 400 K/uL 117(L)  127(L) 131(L)    Coag's Recent Labs Lab 04/29/14 1105  INR 2.64*   BMET BMET    Component Value Date/Time   NA 139 05/02/2014 0215   NA 143 06/12/2013 1125   K 4.1 05/02/2014 0215   K 3.7 06/12/2013 1125   CL 102 05/02/2014 0215   CO2 27 05/02/2014 0215   CO2 32* 06/12/2013 1125   GLUCOSE 92 05/02/2014 0215   GLUCOSE 92 06/12/2013 1125   BUN 18 05/02/2014 0215   BUN 14.6 06/12/2013 1125   CREATININE 0.92 05/02/2014 0215   CREATININE 0.98 01/22/2014 1620   CREATININE 0.9 06/12/2013 1125   CALCIUM 8.4 05/02/2014 0215   CALCIUM 8.9 06/12/2013 1125   GFRNONAA 86* 05/02/2014 0215   GFRNONAA 80 01/22/2014 1620   GFRAA >90 05/02/2014 0215   GFRAA >89 01/22/2014 1620    Electrolytes  Recent Labs Lab 04/29/14 0330 04/30/14 0302  CALCIUM 8.6 8.4  MG  --  1.7  PHOS  --  3.1   Sepsis Markers  Recent Labs Lab 04/29/14 0347 04/29/14 0556  LATICACIDVEN 6.96* 1.23   ABG  Recent Labs Lab 04/29/14 0423 04/29/14 0602 04/30/14 0420  PHART 7.209* 7.329* 7.377  PCO2ART 71.6* 55.2* 46.5*  PO2ART 387.0* 115.0* 83.1   Liver Enzymes  Recent Labs Lab 04/29/14 0330  AST 27  ALT 13  ALKPHOS 103  BILITOT 0.6  ALBUMIN 3.2*   Cardiac Enzymes  Recent Labs Lab 04/29/14 0330 04/29/14 1105 04/29/14 1643 04/29/14 2119  TROPONINI  --  <  0.30 <0.30 <0.30  PROBNP 2902.0*  --   --   --    Glucose  Recent Labs Lab 04/29/14 0842 04/29/14 1200 04/29/14 1556 04/29/14 2042 04/30/14 0017 04/30/14 0704  GLUCAP 83 105* 86 85 82 88    Imaging Ct Angio Chest W/cm &/or Wo Cm  04/29/2014   CLINICAL DATA:  Shortness of breath, found down, unresponsive. Intubation. History of possible bone cancer.  EXAM: CT ANGIOGRAPHY CHEST WITH CONTRAST  TECHNIQUE: Multidetector CT imaging of the chest was performed using the standard protocol during bolus administration of intravenous contrast. Multiplanar CT image reconstructions and MIPs were obtained to evaluate the vascular anatomy.   CONTRAST:  14mL OMNIPAQUE IOHEXOL 350 MG/ML SOLN  COMPARISON:  Chest radiograph April 29, 2014 and CT angiogram of the chest August 22, 2013  FINDINGS: Adequate contrast opacification of the pulmonary artery's. Main pulmonary artery is not enlarged. No pulmonary arterial filling defects to the level of the subsegmental branches.  Heart and pericardium are unremarkable, no right heart strain. 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 Mild calcific atherosclerosis of the aortic arch. No lymphadenopathy by CT size criteria. Endotracheal tube tip terminates in mid trachea. Tracheobronchial tree is patent, no pneumothorax. Moderate bronchial wall thickening. Dense patchy consolidation RIGHT middle lobe, RIGHT lower lobe and to lesser extent LEFT lower lobe. Scattered ground-glass nodules in the RIGHT upper lobe.  Included view of the abdomen is non acute ; nasogastric tube looped in proximal stomach. 2 cm ovoid hypodensity in RIGHT thyroid gland, unchanged for which follow up thyroid sonogram could be performed on a nonemergent basis. Ovoid radiopaque foreign body within the LEFT anterior chest subcutaneous back consistent with battery pack/heart monitor. Severe degenerative changes included RIGHT shoulder.  Review of the MIP images confirms the above findings.  IMPRESSION: No acute pulmonary embolism.  Patchy consolidation in the bilateral lungs in addition to bronchial wall thickening concerning for bronchopneumonia, less likely aspiration.  Similar aneurysmal ascending thoracic aorta, not tailored for evaluation.   Electronically Signed   By: Elon Alas   On: 04/29/2014 05:03   DG chest port 1 view  05/01/14  CLINICAL DATA: Pneumonia. EXAM: PORTABLE CHEST - 1 VIEW COMPARISON: 04/29/2014 and 01/12/2014 and chest CT dated 04/29/2014 FINDINGS: Endotracheal tube and NG tube appear good position. Heart size and pulmonary vascularity are normal. Patchy  infiltrates at the lung bases have resolved on the right and almost resolved at the left base. No acute osseous abnormality. IMPRESSION: Clearing of the infiltrates at the right base. Minimal residual infiltrate on the left. Electronically Signed By: Rozetta Nunnery M.D. On: 05/01/2014 07:19  EKG: Sinus tach, slight ST depression in V4, V5 CXR: Improved int prominence  ASSESSMENT / PLAN:  Principal Problem:   Acute respiratory failure with hypoxia and hypercapnia Active Problems:   Atrial fibrillation   Acute respiratory failure with hypoxia and hypercarbia  PULMONARY A: Hypoxic/ hypercarbic resp failure-resolved Likely undx COPD given exam with severe wheeze R/o PNA, cpa P:   duonebs Incentive spirometry Wean O2 for sats >92% Mobility, ambulation Continued wheezing, add steroids follow response  CARDIOVASCULAR A: H/o cardiomyopathy (??from ETOH) but last echo in 11/14 with normal EF Afib No evidence of ischemia on echo  P:   Cont home amio holding home coreg Asa coum per pharmacy Tele remain  RENAL A: Elevated CR-improved Hypomag-resolved Hypokalemia-resolved P:   Goal euvolemia Monitor UOP Dc foley SL If distress lasix  GASTROINTESTINAL A: Need  for nutrition P:   Diet as tolerated Pepcid, dc  HEMATOLOGIC A: Myeloproliferative d/o anticoagualtion P:   Trend CBC in am  Start home hydroxyurea coum per pharm  INFECTIOUS [place] 10/11>>neg BC 10/11>> RVP 10/11>>rhinovirus MRSA 10/11>> pos A A: Aspiration PNA vs viral resp infection Immunocompromise host>?  Rhinovirus R/o super infection P:   unasyn 10/11>>10/12 ceftaz 10/12>>>10/13 azithro 10/11>>10/13  Ceftriaxone 10/13>>>stop date in place 16th No role antiviral  ENDOCRINE A: Hyperglycemia, suspect undx DM P:   A1c pending SSI  NEUROLOGIC A: Need for sedation-resolved H/o polysubstance abuse P:   Utox pos for methadone Methadone 10mg  BID maintain , will confirm by calling  prescriber ambulation  TODAY'S SUMMARY: extubated 10/13, cont'd wheezing noted, transfer to tele, steroids added To triad  I have personally obtained a history, examined the patient, evaluated laboratory and imaging results, formulated the assessment and plan and placed orders.  I have fully examined this patient and agree with above findings.    ande dited infull  Lavon Paganini. Titus Mould, MD, [place] Pgr: [facility]

## 2014-05-02 NOTE — Care Management Note (Signed)
    Page 1 of 1   05/02/2014     11:18:04 AM CARE MANAGEMENT NOTE 05/02/2014  Patient:  [name], [name]   Account Number:  [id number]  Date Initiated:  05/02/2014  Documentation initiated by:  Elissa Hefty  Subjective/Objective Assessment:   adm w resp failure     Action/Plan:   lives at home, pcp dr t Aundra Dubin   Anticipated DC Date:     Anticipated DC Plan:           Choice offered to / List presented to:             Status of service:   Medicare Important Message given?  YES (If response is "NO", the following Medicare IM given date fields will be blank) Date Medicare IM given:  05/02/2014 Medicare IM given by:  Elissa Hefty Date Additional Medicare IM given:   Additional Medicare IM given by:    Discharge Disposition:    Per UR Regulation:  Reviewed for med. necessity/level of care/duration of stay  If discussed at [place] of Stay Meetings, dates discussed:    Comments:

## 2014-05-03 ENCOUNTER — Inpatient Hospital Stay (HOSPITAL_COMMUNITY): Payer: Medicare Other

## 2014-05-03 DIAGNOSIS — I4891 Unspecified atrial fibrillation: Secondary | ICD-10-CM

## 2014-05-03 DIAGNOSIS — J189 Pneumonia, unspecified organism: Principal | ICD-10-CM

## 2014-05-03 LAB — PHOSPHORUS: PHOSPHORUS: 2.6 mg/dL (ref 2.3–4.6)

## 2014-05-03 LAB — BASIC METABOLIC PANEL
Anion gap: 11 (ref 5–15)
BUN: 16 mg/dL (ref 6–23)
CHLORIDE: 101 meq/L (ref 96–112)
CO2: 30 mEq/L (ref 19–32)
Calcium: 9 mg/dL (ref 8.4–10.5)
Creatinine, Ser: 0.77 mg/dL (ref 0.50–1.35)
GFR calc Af Amer: >90 mL/min (ref >90)
Glucose, Bld: 150 mg/dL — ABNORMAL HIGH (ref 70–99)
Potassium: 4.5 mEq/L (ref 3.7–5.3)
Sodium: 142 mEq/L (ref 137–147)

## 2014-05-03 LAB — MAGNESIUM: Magnesium: 1.9 mg/dL (ref 1.5–2.5)

## 2014-05-03 LAB — PROTIME-INR
INR: 2.19 — ABNORMAL HIGH (ref 0.00–1.49)
PROTHROMBIN TIME: 24.6 s — AB (ref 11.6–15.2)

## 2014-05-03 MED ORDER — WARFARIN SODIUM 5 MG PO TABS
5.0000 mg | ORAL_TABLET | Freq: Once | ORAL | Status: DC
Start: 2014-05-03 — End: 2014-05-03
  Filled 2014-05-03: qty 1

## 2014-05-03 MED ORDER — AMOXICILLIN-POT CLAVULANATE 875-125 MG PO TABS: 1.0000 | ORAL_TABLET | Freq: Two times a day (BID) | ORAL | Status: DC

## 2014-05-03 MED ORDER — METHADONE HCL 10 MG PO TABS: 10.0000 mg | ORAL_TABLET | Freq: Every day | ORAL | Status: DC

## 2014-05-03 MED ORDER — TIOTROPIUM BROMIDE MONOHYDRATE 18 MCG IN CAPS: 18.0000 ug | ORAL_CAPSULE | Freq: Every day | RESPIRATORY_TRACT | Status: DC

## 2014-05-03 MED ORDER — PREDNISONE 10 MG PO TABS: 10.0000 mg | ORAL_TABLET | Freq: Every day | ORAL | Status: DC

## 2014-05-03 NOTE — Discharge Instructions (Signed)
Please follow up with primary care doctor in 1 week  °

## 2014-05-03 NOTE — Discharge Summary (Signed)
Physician Discharge Summary  [name] FUX:[id number] DOB: 07/05/1946 DOA: 04/29/2014  PCP: Cresenciano Genre, MD  Admit date: 04/29/2014 Discharge date: 05/03/2014  Time spent: >35 minutes  Recommendations for Outpatient Follow-up:  F/u with PCP in 1 week  F/u with pain clinic next week  Discharge Diagnoses:  Principal Problem:   Acute respiratory failure with hypoxia and hypercapnia Active Problems:   Atrial fibrillation   Acute respiratory failure with hypoxia and hypercarbia   Discharge Condition: stable   Diet recommendation: low sodium   Filed Weights   04/30/14 0446 05/01/14 0500 05/02/14 0600  Weight: 83.8 kg (184 lb 11.9 oz) 86 kg (189 lb 9.5 oz) 87 kg (191 lb 12.8 oz)    History of present illness:  67yo with myeloproliferative d/o, h/o prior polysubstance abuse, Afib, h/o cardiomyopathy presents with hypoxic, hypercarbic resp failure. Pt was c/o HA and weakness. His partner noted him to be audibly wheezing while he was asleep. He awoke early this AM with severe SOB prompting EMS to be called. Was noted to have sats of 60's in the field. Upon arricval to ED patent noted to be in acute respiratory failure requiring intubation  -His partner did have an upper resp infection recently    Hospital Course:  1. Acute hypoxic respiratory failure, likely due Pneumonia; (mltifocal pneumonia) -initially required intubation/mechnical; ventilation; extubated: currently no respiratory distress  -pneumonia resolved with IV atx (10/11-10/15), afebrile; leukocytosis resolved; blood cultures NGTD; sputum + rinovirus  -Patient clinically stable; no respiratory distress; suspected underlying COPD: need outpatient PFTs;  -atx changed to PO to complete outpatient treatment course, taper prednisone for COPD, added inhalers   2. h/o prior polysubstance abuse; decreased methadone 30-->10 mg; cont outpatient follow up with pain clinic  3. A fib; HR controlled, cont home regimen,  coumadin; outpatient follow up with INR  4. myeloproliferative d/o; cont outpatient follow up with hematology; d/w patient     Procedures:  extubated  (i.e. Studies not automatically included, echos, thoracentesis, etc; not x-rays) SIGNIFICANT EVENTS / STUDIES:  04/29/14: Admitted, Intubated in ED 2/2, CT Chest with diffuse small bilateral areas of consolidation with bronchial wall thickening. No PE  10/12 echo- EF 50-55%, pa peak pressure 38  LINES / TUBES:  ETT: 10/11-->10/13  Foley: 10/11-->10/13  CULTURES:  Blood cx: 10/11-->  Rhino pos  ANTIBIOTICS:  Vanc 10/11-->10/11  Cefipime 10/11-->10/11  Unasyn 10/11-->10/13  Azithro 10/11-->10/13  Ceftriaxone 10/13>>10/15  Consultations:  Pulmonology   Discharge Exam: Filed Vitals:   05/03/14 0955  BP: 119/60  Pulse: 71  Temp: 98 F (36.7 C)  Resp: 18    General: alert Cardiovascular: s1,s2 rrr Respiratory: CTA BL  Discharge Instructions  Discharge Instructions   Diet - low sodium heart healthy    Complete by:  As directed      Discharge instructions    Complete by:  As directed   Please follow up with primary care doctor in 1 week     Increase activity slowly    Complete by:  As directed             Medication List         amiodarone 200 MG tablet  Commonly known as:  PACERONE  Take 1 tablet (200 mg total) by mouth daily.     amoxicillin-clavulanate 875-125 MG per tablet  Commonly known as:  AUGMENTIN  Take 1 tablet by mouth 2 (two) times daily.     carvedilol 12.5 MG tablet  Commonly known as:  COREG  Take 1 tablet (12.5 mg total) by mouth 2 (two) times daily with a meal.     hydroxyurea 500 MG capsule  Commonly known as:  HYDREA  take 3 capsules by mouth once daily *MAY TAKE WITH FOOD TO MINIMIZE GI SIDE EFFECTS     methadone 10 MG tablet  Commonly known as:  DOLOPHINE  Take 1 tablet (10 mg total) by mouth daily. Verified for patient with [facility] on [address]     predniSONE 10  MG tablet  Commonly known as:  DELTASONE  Take 1 tablet (10 mg total) by mouth daily with breakfast.     PROAIR HFA 108 (90 BASE) MCG/ACT inhaler  Generic drug:  albuterol  Inhale into the lungs daily as needed for wheezing or shortness of breath.     PULMICORT FLEXHALER 90 MCG/ACT inhaler  Generic drug:  Budesonide  Inhale 1 puff into the lungs every evening.     tiotropium 18 MCG inhalation capsule  Commonly known as:  SPIRIVA HANDIHALER  Place 1 capsule (18 mcg total) into inhaler and inhale daily.     warfarin 5 MG tablet  Commonly known as:  COUMADIN  Take 2.5-5 mg by mouth See admin instructions. Take 1 tablet Monday-Friday. Take 0.5 tablet on Saturday and Sunday.       Allergies  Allergen Reactions  . Codeine Rash       Follow-up Information   Follow up with Cresenciano Genre, MD. Schedule an appointment as soon as possible for a visit today.   Specialty:  Internal Medicine   Contact information:   [place] [address] [phone]        The results of significant diagnostics from this hospitalization (including imaging, microbiology, ancillary and laboratory) are listed below for reference.    Significant Diagnostic Studies: Dg Chest 1 View  05/03/2014   CLINICAL DATA:  Cough and shortness-of-breath.  EXAM: CHEST - 1 VIEW  COMPARISON:  05/01/2014  FINDINGS: Lungs are adequately inflated without consolidation or effusion. There is no evidence of pneumothorax. There is mild stable cardiomegaly. Metallic implanted device over the left chest wall unchanged. Remainder the exam is unchanged.  IMPRESSION: No acute cardiopulmonary disease.  Mild stable cardiomegaly.   Electronically Signed   By: Marin Olp M.D.   On: 05/03/2014 08:28   Ct Angio Chest W/cm &/or Wo Cm  04/29/2014   CLINICAL DATA:  Shortness of breath, found down, unresponsive. Intubation. History of possible bone cancer.  EXAM: CT ANGIOGRAPHY CHEST WITH CONTRAST  TECHNIQUE: Multidetector  CT imaging of the chest was performed using the standard protocol during bolus administration of intravenous contrast. Multiplanar CT image reconstructions and MIPs were obtained to evaluate the vascular anatomy.  CONTRAST:  132m OMNIPAQUE IOHEXOL 350 MG/ML SOLN  COMPARISON:  Chest radiograph April 29, 2014 and CT angiogram of the chest August 22, 2013  FINDINGS: Adequate contrast opacification of the pulmonary artery's. Main pulmonary artery is not enlarged. No pulmonary arterial filling defects to the level of the subsegmental branches.  Heart and pericardium are unremarkable, no right heart strain. 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 Mild calcific atherosclerosis of the aortic arch. No lymphadenopathy by CT size criteria. Endotracheal tube tip terminates in mid trachea. Tracheobronchial tree is patent, no pneumothorax. Moderate bronchial wall thickening. Dense patchy consolidation RIGHT middle lobe, RIGHT lower lobe and to lesser extent LEFT lower lobe. Scattered ground-glass nodules in the  RIGHT upper lobe.  Included view of the abdomen is non acute ; nasogastric tube looped in proximal stomach. 2 cm ovoid hypodensity in RIGHT thyroid gland, unchanged for which follow up thyroid sonogram could be performed on a nonemergent basis. Ovoid radiopaque foreign body within the LEFT anterior chest subcutaneous back consistent with battery pack/heart monitor. Severe degenerative changes included RIGHT shoulder.  Review of the MIP images confirms the above findings.  IMPRESSION: No acute pulmonary embolism.  Patchy consolidation in the bilateral lungs in addition to bronchial wall thickening concerning for bronchopneumonia, less likely aspiration.  Similar aneurysmal ascending thoracic aorta, not tailored for evaluation.   Electronically Signed   By: Elon Alas   On: 04/29/2014 05:03   Dg Chest Port 1 View  05/01/2014   CLINICAL DATA:  Pneumonia.   EXAM: PORTABLE CHEST - 1 VIEW  COMPARISON:  04/29/2014 and 01/12/2014 and chest CT dated 04/29/2014  FINDINGS: Endotracheal tube and NG tube appear good position. Heart size and pulmonary vascularity are normal. Patchy infiltrates at the lung bases have resolved on the right and almost resolved at the left base.  No acute osseous abnormality.  IMPRESSION: Clearing of the infiltrates at the right base. Minimal residual infiltrate on the left.   Electronically Signed   By: Rozetta Nunnery M.D.   On: 05/01/2014 07:19   Dg Chest Port 1 View  04/29/2014   CLINICAL DATA:  Endotracheal tube placement. Acute onset of shortness of breath. Initial encounter.  EXAM: PORTABLE CHEST - 1 VIEW  COMPARISON:  Chest radiograph performed 01/12/2014  FINDINGS: The patient's endotracheal tube is seen ending 4 cm above the carina. An enteric tube is noted extending below the diaphragm.  Medial bibasilar airspace opacification raises concern for multifocal pneumonia. Mild interstitial edema might have a similar appearance. Mild vascular congestion is seen. No pleural effusion or pneumothorax is identified.  The cardiomediastinal silhouette is borderline normal in size. No acute osseous abnormalities are identified.  IMPRESSION: 1. Endotracheal tube seen ending 4 cm above the carina. 2. Medial bibasilar airspace opacification raises concern for multifocal pneumonia. Mild interstitial edema might have a similar appearance. 3. Mild vascular congestion noted.   Electronically Signed   By: Garald Balding M.D.   On: 04/29/2014 04:59    Microbiology: Recent Results (from the past 240 hour(s))  CULTURE, BLOOD (ROUTINE X 2)     Status: None   Collection Time    04/29/14  4:20 AM      Result Value Ref Range Status   Specimen Description BLOOD LEFT ARM   Final   Special Requests BOTTLES DRAWN AEROBIC AND ANAEROBIC 10CC EA   Final   Culture  Setup Time     Final   Value: 04/29/2014 13:41     Performed at [facility]   Culture      Final   Value:        BLOOD CULTURE RECEIVED NO GROWTH TO DATE CULTURE WILL BE HELD FOR 5 DAYS BEFORE ISSUING A FINAL NEGATIVE REPORT     Note: Culture results may be compromised due to an excessive volume of blood received in culture bottles.     Performed at [facility]   Report Status PENDING   Incomplete  CULTURE, BLOOD (ROUTINE X 2)     Status: None   Collection Time    04/29/14  4:30 AM      Result Value Ref Range Status   Specimen Description BLOOD LEFT HAND   Final  Special Requests BOTTLES DRAWN AEROBIC ONLY 10CC   Final   Culture  Setup Time     Final   Value: 04/29/2014 13:31     Performed at [facility]   Culture     Final   Value:        BLOOD CULTURE RECEIVED NO GROWTH TO DATE CULTURE WILL BE HELD FOR 5 DAYS BEFORE ISSUING A FINAL NEGATIVE REPORT     Performed at [facility]   Report Status PENDING   Incomplete  MRSA PCR SCREENING     Status: Abnormal   Collection Time    04/29/14  9:06 AM      Result Value Ref Range Status   MRSA by PCR POSITIVE (*) NEGATIVE Final   Comment:            The GeneXpert MRSA Assay (FDA     approved for NASAL specimens     only), is one component of a     comprehensive MRSA colonization     surveillance program. It is not     intended to diagnose MRSA     infection nor to guide or     monitor treatment for     MRSA infections.     RESULT CALLED TO, READ BACK BY AND VERIFIED WITH:     WILLIFORD,B RN 04/29/14 1249 WOOTEN,K  CULTURE, RESPIRATORY (NON-EXPECTORATED)     Status: None   Collection Time    04/29/14  9:49 AM      Result Value Ref Range Status   Specimen Description TRACHEAL ASPIRATE   Final   Special Requests NONE   Final   Gram Stain     Final   Value: MODERATE WBC PRESENT, PREDOMINANTLY PMN     NO SQUAMOUS EPITHELIAL CELLS SEEN     NO ORGANISMS SEEN     Performed at [facility]   Culture     Final   Value: Non-Pathogenic Oropharyngeal-type Flora Isolated.     Performed at  [facility]   Report Status 05/01/2014 FINAL   Final  RESPIRATORY VIRUS PANEL     Status: Abnormal   Collection Time    04/29/14  4:52 PM      Result Value Ref Range Status   Source - RVPAN NASOPHARYNGEAL   Final   Respiratory Syncytial Virus A NOT DETECTED   Final   Respiratory Syncytial Virus B NOT DETECTED   Final   Influenza A NOT DETECTED   Final   Influenza B NOT DETECTED   Final   Parainfluenza 1 NOT DETECTED   Final   Parainfluenza 2 NOT DETECTED   Final   Parainfluenza 3 NOT DETECTED   Final   Metapneumovirus NOT DETECTED   Final   Rhinovirus DETECTED (*)  Final   Adenovirus NOT DETECTED   Final   Influenza A H1 NOT DETECTED   Final   Influenza A H3 NOT DETECTED   Final   Comment: (NOTE)           Normal Reference Range for each Analyte: NOT DETECTED     Testing performed using the Luminex xTAG Respiratory Viral Panel test     kit.     The analytical performance characteristics of this assay have been     determined by [facility].  The modifications have not been     cleared or approved by the FDA. This assay has been validated pursuant     to the  CLIA regulations and is used for clinical purposes.     Performed at [facility]: Basic Metabolic Panel:  Recent Labs Lab 04/29/14 0330 04/29/14 0342 04/29/14 1105 04/30/14 0302  05/01/14 0030 05/01/14 1039 05/01/14 2350 05/02/14 0215 05/02/14 1145 05/03/14 0059 05/03/14 0426  NA 140 140  --  145  --  141  --   --  139  --   --  142  K 4.5 4.3  --  3.9  --  3.5*  --   --  4.1  --   --  4.5  CL 100 103  --  107  --  104  --   --  102  --   --  101  CO2 20  --   --  28  --  29  --   --  27  --   --  30  GLUCOSE 272* 268*  --  89  --  100*  --   --  92  --   --  150*  BUN 17 22  --  14  --  20  --   --  18  --   --  16  CREATININE 1.23 1.40* 0.95 0.98  --  0.88  --   --  0.92  --   --  0.77  CALCIUM 8.6  --   --  8.4  --  8.3*  --   --  8.4  --   --  9.0  MG  --   --   --  1.7   < > 1.9 1.9 1.8  --  1.8 1.9  --   PHOS  --   --   --  3.1  < > 2.5 3.3 3.3  --  3.1 2.6  --   < > = values in this interval not displayed. Liver Function Tests:  Recent Labs Lab 04/29/14 0330  AST 27  ALT 13  ALKPHOS 103  BILITOT 0.6  PROT 7.4  ALBUMIN 3.2*   No results found for this basename: LIPASE, AMYLASE,  in the last 168 hours No results found for this basename: AMMONIA,  in the last 168 hours CBC:  Recent Labs Lab 04/29/14 0330 04/29/14 0342 04/29/14 1105 04/30/14 0302 05/01/14 0030 05/02/14 1145  WBC 15.9*  --  9.5 6.7 4.9 3.9*  NEUTROABS  --   --   --   --  3.3  --   HGB 13.2 15.0 12.0* 11.0* 10.4* 10.4*  HCT 41.4 44.0 36.2* 33.1* 31.7* 31.2*  MCV 126.2*  --  122.7* 122.6* 125.8* 121.9*  PLT 200  --  131* 127* 117* 119*   Cardiac Enzymes:  Recent Labs Lab 04/29/14 1105 04/29/14 1643 04/29/14 2119  TROPONINI <0.30 <0.30 <0.30   BNP: BNP (last 3 results)  Recent Labs  08/22/13 1000 01/12/14 2148 04/29/14 0330  PROBNP 2511.0* 1110.0* 2902.0*   CBG:  Recent Labs Lab 05/01/14 1953 05/02/14 0014 05/02/14 0359 05/02/14 0744 05/02/14 1133  GLUCAP 125* 123* 98 116* 141*       Signed:  Chandrika Sandles N  [facility] 05/03/2014, 12:17 PM

## 2014-05-03 NOTE — Progress Notes (Signed)
ANTICOAGULATION CONSULT NOTE - Follow-Up Consult  Pharmacy Consult:  Coumadin Indication: atrial fibrillation  Allergies  Allergen Reactions  . Codeine Rash    Patient Measurements: Height: 5\' 10"  (177.8 cm) Weight: 191 lb 12.8 oz (87 kg) IBW/kg (Calculated) : 73  Vital Signs: Temp: 97.8 F (36.6 C) (10/15 0535) Temp Source: Oral (10/14 2046) BP: 128/63 mmHg (10/15 0535) Pulse Rate: 73 (10/15 0535)  Labs:  Recent Labs  05/01/14 0030 05/02/14 0215 05/02/14 1145 05/03/14 0426  HGB 10.4*  --  10.4*  --   HCT 31.7*  --  31.2*  --   PLT 117*  --  119*  --   LABPROT 20.4* 21.5*  --  24.6*  INR 1.75* 1.87*  --  2.19*  CREATININE 0.88 0.92  --  0.77    Estimated Creatinine Clearance: 93.8 ml/min (by C-G formula based on Cr of 0.77).    Assessment: 43 YOM with atrial fibrillation to continue on Coumadin from PTA.  INR therapeutic; no bleeding reported.   Goal of Therapy:  INR 2-3    Plan:  - Coumadin 5mg  PO today - Daily PT / INR    Bartow Zylstra D. Mina Marble, PharmD, BCPS Pager:  [phone] 05/03/2014, 8:44 AM

## 2014-05-04 LAB — METHADONE, CONFIRMATION
Methadone GC/MS Conf: 6611 ng/mL — ABNORMAL HIGH (ref <100)
Methadone metabolite, urine: 3414 ng/mL — ABNORMAL HIGH (ref <100)

## 2014-05-04 NOTE — Progress Notes (Signed)
Loop recorder 

## 2014-05-05 LAB — CULTURE, BLOOD (ROUTINE X 2)
Culture: NO GROWTH
Culture: NO GROWTH

## 2014-05-07 ENCOUNTER — Ambulatory Visit (INDEPENDENT_AMBULATORY_CARE_PROVIDER_SITE_OTHER): Payer: Medicare Other | Admitting: Pharmacist

## 2014-05-07 DIAGNOSIS — I4891 Unspecified atrial fibrillation: Secondary | ICD-10-CM

## 2014-05-07 LAB — POCT INR: INR: 2.1

## 2014-05-07 NOTE — Progress Notes (Signed)
Anti-Coagulation Progress Note  [name] is a 68 y.o. male who is currently on an anti-coagulation regimen.    RECENT RESULTS: Recent results are below, the most recent result is correlated with a dose of 22.5 mg. per week: Lab Results  Component Value Date   INR 2.10 05/07/2014   INR 2.19* 05/03/2014   INR 1.87* 05/02/2014    ANTI-COAG DOSE: Anticoagulation Dose Instructions as of 05/07/2014     [name] Tue Wed Thu Fri Sat   New Dose 2.5 mg 5 mg 2.5 mg 2.5 mg 5 mg 2.5 mg 2.5 mg       ANTICOAG SUMMARY: Anticoagulation Episode Summary   Current INR goal 2.0-3.0  Next INR check 06/04/2014  INR from last check 2.10 (05/07/2014)  Weekly max dose   Target end date Indefinite  [facility]  Preferred lab   Send INR reminders to    Indications  Atrial fibrillation [I48.91]        Comments         ANTICOAG TODAY: Anticoagulation Summary as of 05/07/2014   INR goal 2.0-3.0  Selected INR 2.10 (05/07/2014)  Next INR check 06/04/2014  Target end date Indefinite   Indications  Atrial fibrillation [I48.91]      Anticoagulation Episode Summary   [facility] check [facility]   Preferred lab    Send INR reminders to    Comments       PATIENT INSTRUCTIONS: Patient Instructions  Patient instructed to take medications as defined in the Anti-coagulation Track section of this encounter.  Patient instructed to take today's dose.  Patient verbalized understanding of these instructions.       FOLLOW-UP Return in 4 weeks (on 06/04/2014) for Follow up INR at 0900h.  [name], III Pharm.D., CACP

## 2014-05-07 NOTE — Patient Instructions (Signed)
Patient instructed to take medications as defined in the Anti-coagulation Track section of this encounter.  Patient instructed to take today's dose.  Patient verbalized understanding of these instructions.    

## 2014-05-07 NOTE — Progress Notes (Signed)
Indication: Atrial fibrillation. Duration: Lifelong. INR: At target. Agree with Dr. Gladstone Pih assessment and plan.

## 2014-05-15 LAB — MDC_IDC_ENUM_SESS_TYPE_REMOTE: Date Time Interrogation Session: 20151016040500

## 2014-05-24 ENCOUNTER — Ambulatory Visit (INDEPENDENT_AMBULATORY_CARE_PROVIDER_SITE_OTHER): Payer: Medicare Other | Admitting: Internal Medicine

## 2014-05-24 ENCOUNTER — Encounter: Payer: Self-pay | Admitting: Internal Medicine

## 2014-05-24 VITALS — BP 140/60 | HR 53 | Temp 98.2°F | Ht 66.0 in | Wt 197.3 lb

## 2014-05-24 DIAGNOSIS — D471 Chronic myeloproliferative disease: Secondary | ICD-10-CM

## 2014-05-24 DIAGNOSIS — Z720 Tobacco use: Secondary | ICD-10-CM

## 2014-05-24 DIAGNOSIS — I482 Chronic atrial fibrillation, unspecified: Secondary | ICD-10-CM

## 2014-05-24 DIAGNOSIS — Z23 Encounter for immunization: Secondary | ICD-10-CM

## 2014-05-24 DIAGNOSIS — R791 Abnormal coagulation profile: Secondary | ICD-10-CM

## 2014-05-24 DIAGNOSIS — J9602 Acute respiratory failure with hypercapnia: Principal | ICD-10-CM

## 2014-05-24 DIAGNOSIS — Z0000 Encounter for general adult medical examination without abnormal findings: Secondary | ICD-10-CM

## 2014-05-24 DIAGNOSIS — I10 Essential (primary) hypertension: Secondary | ICD-10-CM

## 2014-05-24 DIAGNOSIS — J9601 Acute respiratory failure with hypoxia: Secondary | ICD-10-CM

## 2014-05-24 LAB — POCT INR: INR: 3.6

## 2014-05-24 LAB — COMPLETE METABOLIC PANEL WITH GFR
ALK PHOS: 95 U/L (ref 39–117)
ALT: 9 U/L (ref 0–53)
AST: 16 U/L (ref 0–37)
Albumin: 3.2 g/dL — ABNORMAL LOW (ref 3.5–5.2)
BILIRUBIN TOTAL: 0.5 mg/dL (ref 0.2–1.2)
BUN: 12 mg/dL (ref 6–23)
CO2: 29 mEq/L (ref 19–32)
Calcium: 8.2 mg/dL — ABNORMAL LOW (ref 8.4–10.5)
Chloride: 106 mEq/L (ref 96–112)
Creat: 1 mg/dL (ref 0.50–1.35)
GFR, Est African American: >89 mL/min
GFR, Est Non African American: 78 mL/min
Glucose, Bld: 87 mg/dL (ref 70–99)
Potassium: 4 mEq/L (ref 3.5–5.3)
SODIUM: 140 meq/L (ref 135–145)
TOTAL PROTEIN: 6.1 g/dL (ref 6.0–8.3)

## 2014-05-24 NOTE — Assessment & Plan Note (Signed)
Will repeat CBC today  F/u with H/O in the future

## 2014-05-24 NOTE — Progress Notes (Signed)
   Subjective:    Patient ID: [name], male    DOB: 05/01/1946, 68 y.o.   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 Recently in the hospital for bronchopneumonia with acute respiratory failure hypoxic and hypercarbic.  He is feeling better completed Augmentin, prednisone course and is using inhalers.  He was intubated during that admission. 04/29/14 CT with infiltrate in right lung base and + bronchopneumonia.  He still have sob with exertion.   Needs to be sch for pfts. BP today 140/60  2. AF-on Coumadin recently on abx will check INR today  3. 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 Phone [phone]  4. He c/o ankle swelling. Ed salt intake repeat echo above.  He will try leg elevation 1st  SH: smoking less 1-2 cigarettes qd  HM: will get referral for GI for colonoscopy, will get record of Zostavax, will give Prevnar today      Review of Systems  Respiratory: Positive for shortness of breath.        +sob with exertion   Cardiovascular: Positive for leg swelling. Negative for chest pain.  Gastrointestinal: Negative for blood in stool.  Neurological: Negative for weakness, light-headedness and headaches.       Objective:   Physical Exam  Constitutional: He is oriented to person, place, and time. Vital signs are normal. He appears well-developed and well-nourished. He is cooperative.  HENT:  Head: Normocephalic and atraumatic.  Mouth/Throat: Oropharynx is clear and moist and mucous membranes are normal. No oropharyngeal exudate.  Eyes: Conjunctivae are normal. Pupils are equal, round, and reactive to light. Right eye exhibits no discharge. Left eye exhibits no discharge. No scleral icterus.  Cardiovascular: Normal  rate, regular rhythm, S1 normal, S2 normal and normal heart sounds.   No murmur heard. 1+ lower ext edema   Pulmonary/Chest: Effort normal and breath sounds normal.  Abdominal: Soft. Bowel sounds are normal. He exhibits no distension. There is no tenderness.  Musculoskeletal: He exhibits no edema.  Neurological: He is alert and oriented to person, place, and time. Gait normal.  Skin: Skin is warm, dry and intact. No rash noted.  Psychiatric: He has a normal mood and affect. His speech is normal and behavior is normal. Judgment and thought content normal. Cognition and memory are normal.  Nursing note and vitals reviewed.         Assessment & Plan:  F/u in 4 months, Monday with Dr. Elie Confer

## 2014-05-24 NOTE — Assessment & Plan Note (Signed)
INR 3.6 likely elevated due to recently abx use Dr. Maudie Mercury met with [name]  He will f/u with Dr. Elie Confer Monday

## 2014-05-24 NOTE — Patient Instructions (Addendum)
General Instructions: I will let you know about your labs from today  Please follow up 4 months, sooner if needed  Please sch with heart doctor and see Dr. Elie Confer Monday  Come the week of your birthday to get a chest Xray to make sure your pneumonia is gone  We will send you to the stomach doctor and also get lung tests to check for COPD    Thank you for bringing your medicines today. This helps [place] keep you safe from mistakes.   Progress Toward Treatment Goals:  Treatment Goal 05/24/2014  Blood pressure at goal  Stop smoking smoking less    Self Care Goals & Plans:  Self Care Goal 05/24/2014  Manage my medications take my medicines as prescribed; bring my medications to every visit; refill my medications on time; follow the sick day instructions if I am sick  Monitor my health keep track of my blood pressure  Eat healthy foods drink diet soda or water instead of juice or soda; eat more vegetables; eat foods that are low in salt; eat baked foods instead of fried foods; eat fruit for snacks and desserts  Be physically active find an activity I enjoy; find workout friends; find time in my schedule; find a convenient safe place to exercise; take a walk every day; park at the far end of the parking lot  Stop smoking call [facility] (1-800-QUIT-NOW)  Meeting treatment goals maintain the current self-care plan    No flowsheet data found.   Care Management & Community Referrals:  Referral 05/24/2014  Referrals made for care management support none needed  Referrals made to community resources none        Treatment Goals:  Goals (1 Years of Data) as of 05/24/14          As of Today 05/03/14 05/03/14 05/03/14 05/02/14     Blood Pressure   . Blood Pressure < 140/90  140/60 137/61 119/60 128/63 136/65      Progress Toward Treatment Goals:  Treatment Goal 05/24/2014  Blood pressure at goal  Stop smoking smoking less    Self Care Goals & Plans:  Self Care Goal 05/24/2014   Manage my medications take my medicines as prescribed; bring my medications to every visit; refill my medications on time; follow the sick day instructions if I am sick  Monitor my health keep track of my blood pressure  Eat healthy foods drink diet soda or water instead of juice or soda; eat more vegetables; eat foods that are low in salt; eat baked foods instead of fried foods; eat fruit for snacks and desserts  Be physically active find an activity I enjoy; find workout friends; find time in my schedule; find a convenient safe place to exercise; take a walk every day; park at the far end of the parking lot  Stop smoking call [facility] (1-800-QUIT-NOW)  Meeting treatment goals maintain the current self-care plan    No flowsheet data found.   Care Management & Community Referrals:  Referral 05/24/2014  Referrals made for care management support none needed  Referrals made to community resources none       Edema Edema is an abnormal buildup of fluids in your bodytissues. Edema is somewhatdependent on gravity to pull the fluid to the lowest place in your body. That makes the condition more common in the legs and thighs (lower extremities). Painless swelling of the feet and ankles is common and becomes more likely as you get older. It is also  common in looser tissues, like around your eyes.  When the affected area is squeezed, the fluid may move out of that spot and leave a dent for a few moments. This dent is called pitting.  CAUSES  There are many possible causes of edema. Eating too much salt and being on your feet or sitting for a long time can cause edema in your legs and ankles. Hot weather may make edema worse. Common medical causes of edema include:  Heart failure.  Liver disease.  Kidney disease.  Weak blood vessels in your legs.  Cancer.  An injury.  Pregnancy.  Some medications.  Obesity. SYMPTOMS  Edema is usually painless.Your skin may look swollen or  shiny.  DIAGNOSIS  Your health care provider may be able to diagnose edema by asking about your medical history and doing a physical exam. You may need to have tests such as X-rays, an electrocardiogram, or blood tests to check for medical conditions that may cause edema.  TREATMENT  Edema treatment depends on the cause. If you have heart, liver, or kidney disease, you need the treatment appropriate for these conditions. General treatment may include:  Elevation of the affected body part above the level of your heart.  Compression of the affected body part. Pressure from elastic bandages or support stockings squeezes the tissues and forces fluid back into the blood vessels. This keeps fluid from entering the tissues.  Restriction of fluid and salt intake.  Use of a water pill (diuretic). These medications are appropriate only for some types of edema. They pull fluid out of your body and make you urinate more often. This gets rid of fluid and reduces swelling, but diuretics can have side effects. Only use diuretics as directed by your health care provider. HOME CARE INSTRUCTIONS   Keep the affected body part above the level of your heart when you are lying down.   Do not sit still or stand for prolonged periods.   Do not put anything directly under your knees when lying down.  Do not wear constricting clothing or garters on your upper legs.   Exercise your legs to work the fluid back into your blood vessels. This may help the swelling go down.   Wear elastic bandages or support stockings to reduce ankle swelling as directed by your health care provider.   Eat a low-salt diet to reduce fluid if your health care provider recommends it.   Only take medicines as directed by your health care provider. SEEK MEDICAL CARE IF:   Your edema is not responding to treatment.  You have heart, liver, or kidney disease and notice symptoms of edema.  You have edema in your legs that does not  improve after elevating them.   You have sudden and unexplained weight gain. SEEK IMMEDIATE MEDICAL CARE IF:   You develop shortness of breath or chest pain.   You cannot breathe when you lie down.  You develop pain, redness, or warmth in the swollen areas.   You have heart, liver, or kidney disease and suddenly get edema.  You have a fever and your symptoms suddenly get worse. MAKE SURE YOU:   Understand these instructions.  Will watch your condition.  Will get help right away if you are not doing well or get worse. Document Released: 07/06/2005 Document Revised: 11/20/2013 Document Reviewed: 04/28/2013 [facility] Patient Information 2015 [place], [place]. This information is not intended to replace advice given to you by your health care provider. Make sure you  discuss any questions you have with your health care provider.  

## 2014-05-24 NOTE — Assessment & Plan Note (Signed)
BP Readings from Last 3 Encounters:  05/24/14 140/60  05/03/14 137/61  04/24/14 125/70    Lab Results  Component Value Date   NA 142 05/03/2014   K 4.5 05/03/2014   CREATININE 0.77 05/03/2014    Assessment: Blood pressure control: controlled Progress toward BP goal:  at goal Comments: none  Plan: Medications:  continue current medications (Coreg 12.5 mg bid) Other plans: will check CMET today

## 2014-05-24 NOTE — Assessment & Plan Note (Signed)
  Assessment: Progress toward smoking cessation:  smoking less Barriers to progress toward smoking cessation:  none Comments: none  Plan: Instruction/counseling given:  I commended patient for quitting and reviewed strategies for preventing relapses. Educational resources provided:  [facility] Insurance account manager) brochure (cutting back on his own) Self management tools provided: no Medications to assist with smoking cessation:  None Patient agreed to the following self-care plans for smoking cessation: call [facility] (1-800-QUIT-NOW)  Other plans: reassess at f/u

## 2014-05-24 NOTE — Assessment & Plan Note (Addendum)
Likely 2/2 COPD with recent bronchopneumonia, doing well  Will repeat CXR week of 06/03/14  Will get pfts to w/u for COPD Continue inhalers for presumed COPD Consider referral to pulmonology after pfts resulted

## 2014-05-24 NOTE — Assessment & Plan Note (Signed)
Will refer to GI for colonoscopy screening with h/o fobt + Will get record Zoster vaccine  Given Prevnar today

## 2014-05-24 NOTE — Progress Notes (Signed)
Patient ID: [name], male   DOB: 12-Aug-1945, 68 y.o.   MRN: [id number] Case discussed with Dr. Aundra Dubin soon after the resident saw the patient.  We reviewed the resident's history and exam and pertinent patient test results.  I agree with the assessment, diagnosis, and plan of care documented in the resident's note.

## 2014-05-25 ENCOUNTER — Encounter: Payer: Self-pay | Admitting: Internal Medicine

## 2014-05-25 LAB — CBC WITH DIFFERENTIAL/PLATELET
BASOS PCT: 0 % (ref 0–1)
Basophils Absolute: 0 10*3/uL (ref 0.0–0.1)
Eosinophils Absolute: 0.2 10*3/uL (ref 0.0–0.7)
Eosinophils Relative: 3 % (ref 0–5)
HCT: 35.2 % — ABNORMAL LOW (ref 39.0–52.0)
Hemoglobin: 12 g/dL — ABNORMAL LOW (ref 13.0–17.0)
Lymphocytes Relative: 22 % (ref 12–46)
Lymphs Abs: 1.3 10*3/uL (ref 0.7–4.0)
MCH: 39.7 pg — AB (ref 26.0–34.0)
MCHC: 34.1 g/dL (ref 30.0–36.0)
MCV: 116.6 fL — ABNORMAL HIGH (ref 78.0–100.0)
Monocytes Absolute: 0.6 10*3/uL (ref 0.1–1.0)
Monocytes Relative: 11 % (ref 3–12)
NEUTROS PCT: 64 % (ref 43–77)
Neutro Abs: 3.6 10*3/uL (ref 1.7–7.7)
PLATELETS: 147 10*3/uL — AB (ref 150–400)
RBC: 3.02 MIL/uL — AB (ref 4.22–5.81)
RDW: 14.3 % (ref 11.5–15.5)
WBC: 5.7 10*3/uL (ref 4.0–10.5)

## 2014-05-28 ENCOUNTER — Ambulatory Visit (INDEPENDENT_AMBULATORY_CARE_PROVIDER_SITE_OTHER): Payer: Medicare Other | Admitting: Pharmacist

## 2014-05-28 ENCOUNTER — Other Ambulatory Visit: Payer: Self-pay | Admitting: *Deleted

## 2014-05-28 DIAGNOSIS — I4891 Unspecified atrial fibrillation: Secondary | ICD-10-CM

## 2014-05-28 LAB — POCT INR: INR: 3

## 2014-05-28 MED ORDER — ALBUTEROL SULFATE HFA 108 (90 BASE) MCG/ACT IN AERS: 1.0000 | INHALATION_SPRAY | Freq: Every day | RESPIRATORY_TRACT | Status: DC | PRN

## 2014-05-28 NOTE — Patient Instructions (Signed)
Patient instructed to take medications as defined in the Anti-coagulation Track section of this encounter.  Patient instructed to take today's dose.  Patient verbalized understanding of these instructions.    

## 2014-05-28 NOTE — Progress Notes (Signed)
Anti-Coagulation Progress Note  [name] is a 68 y.o. male who is currently on an anti-coagulation regimen.    RECENT RESULTS: Recent results are below, the most recent result is correlated with a dose of 22.5 mg. per week: Lab Results  Component Value Date   INR 3.00 05/28/2014   INR 3.6 05/24/2014   INR 2.10 05/07/2014    ANTI-COAG DOSE: Anticoagulation Dose Instructions as of 05/28/2014      [name] Tue Wed Thu Fri Sat   New Dose 2.5 mg 5 mg 2.5 mg 2.5 mg 2.5 mg 2.5 mg 2.5 mg       ANTICOAG SUMMARY: Anticoagulation Episode Summary    Current INR goal 2.0-3.0  Next INR check 06/18/2014  INR from last check 3.00 (05/28/2014)  Weekly max dose   Target end date Indefinite  [facility]  Preferred lab   Send INR reminders to    Indications  Atrial fibrillation [I48.91]        Comments         ANTICOAG TODAY: Anticoagulation Summary as of 05/28/2014    INR goal 2.0-3.0  Selected INR 3.00 (05/28/2014)  Next INR check 06/18/2014  Target end date Indefinite   Indications  Atrial fibrillation [I48.91]      Anticoagulation Episode Summary    [facility] check [facility]   Preferred lab    Send INR reminders to    Comments       PATIENT INSTRUCTIONS: Patient Instructions  Patient instructed to take medications as defined in the Anti-coagulation Track section of this encounter.  Patient instructed to take today's dose.  Patient verbalized understanding of these instructions.       FOLLOW-UP Return in 3 weeks (on 06/18/2014) for Follow up INR at 0930h.  [name], III Pharm.D., CACP

## 2014-05-30 ENCOUNTER — Encounter: Payer: Self-pay | Admitting: Internal Medicine

## 2014-05-30 ENCOUNTER — Ambulatory Visit (HOSPITAL_COMMUNITY)
Admission: RE | Admit: 2014-05-30 | Discharge: 2014-05-30 | Disposition: A | Discharge status: Home / Self Care | Payer: Medicare Other | Source: Ambulatory Visit | Attending: Internal Medicine | Admitting: Internal Medicine

## 2014-05-30 DIAGNOSIS — J9602 Acute respiratory failure with hypercapnia: Secondary | ICD-10-CM

## 2014-05-30 DIAGNOSIS — J449 Chronic obstructive pulmonary disease, unspecified: Secondary | ICD-10-CM | POA: Insufficient documentation

## 2014-05-30 DIAGNOSIS — J9601 Acute respiratory failure with hypoxia: Secondary | ICD-10-CM

## 2014-05-30 DIAGNOSIS — F17210 Nicotine dependence, cigarettes, uncomplicated: Secondary | ICD-10-CM | POA: Diagnosis not present

## 2014-05-30 LAB — PULMONARY FUNCTION TEST
DL/VA % PRED: 93 %
DL/VA: 4.06 ml/min/mmHg/L
DLCO COR % PRED: 62 %
DLCO UNC % PRED: 62 %
DLCO UNC: 16.84 ml/min/mmHg
DLCO cor: 16.84 ml/min/mmHg
FEF 25-75 POST: 0.86 L/s
FEF 25-75 Pre: 0.69 L/sec
FEF2575-%Change-Post: 25 %
FEF2575-%PRED-PRE: 30 %
FEF2575-%Pred-Post: 38 %
FEV1-%Change-Post: 3 %
FEV1-%PRED-POST: 47 %
FEV1-%Pred-Pre: 45 %
FEV1-POST: 1.35 L
FEV1-PRE: 1.31 L
FEV1FVC-%CHANGE-POST: 2 %
FEV1FVC-%PRED-PRE: 77 %
FEV6-%Change-Post: 2 %
FEV6-%Pred-Post: 61 %
FEV6-%Pred-Pre: 59 %
FEV6-Post: 2.24 L
FEV6-Pre: 2.19 L
FEV6FVC-%CHANGE-POST: 3 %
FEV6FVC-%Pred-Post: 106 %
FEV6FVC-%Pred-Pre: 102 %
FVC-%CHANGE-POST: 1 %
FVC-%PRED-POST: 59 %
FVC-%Pred-Pre: 58 %
FVC-Post: 2.3 L
FVC-Pre: 2.27 L
PRE FEV1/FVC RATIO: 58 %
PRE FEV6/FVC RATIO: 97 %
Post FEV1/FVC ratio: 59 %
Post FEV6/FVC ratio: 100 %
RV % pred: 99 %
RV: 2.15 L
TLC % pred: 73 %
TLC: 4.57 L

## 2014-05-30 MED ORDER — ALBUTEROL SULFATE (2.5 MG/3ML) 0.083% IN NEBU
2.5000 mg | INHALATION_SOLUTION | Freq: Once | RESPIRATORY_TRACT | Status: AC
  Administered 2014-05-30: 2.5 mg via RESPIRATORY_TRACT

## 2014-05-31 ENCOUNTER — Ambulatory Visit (INDEPENDENT_AMBULATORY_CARE_PROVIDER_SITE_OTHER): Payer: Medicare Other | Admitting: *Deleted

## 2014-05-31 DIAGNOSIS — R55 Syncope and collapse: Secondary | ICD-10-CM

## 2014-06-03 ENCOUNTER — Inpatient Hospital Stay (HOSPITAL_COMMUNITY)
Admission: EM | Admit: 2014-06-03 | Discharge: 2014-06-04 | DRG: 291 | Disposition: A | Discharge status: Home / Self Care | Payer: Medicare Other | Attending: Internal Medicine | Admitting: Internal Medicine

## 2014-06-03 ENCOUNTER — Encounter (HOSPITAL_COMMUNITY): Payer: Self-pay | Admitting: *Deleted

## 2014-06-03 ENCOUNTER — Emergency Department (HOSPITAL_COMMUNITY): Payer: Medicare Other

## 2014-06-03 DIAGNOSIS — R410 Disorientation, unspecified: Secondary | ICD-10-CM

## 2014-06-03 DIAGNOSIS — B1920 Unspecified viral hepatitis C without hepatic coma: Secondary | ICD-10-CM | POA: Diagnosis present

## 2014-06-03 DIAGNOSIS — I429 Cardiomyopathy, unspecified: Secondary | ICD-10-CM | POA: Diagnosis present

## 2014-06-03 DIAGNOSIS — I10 Essential (primary) hypertension: Secondary | ICD-10-CM | POA: Diagnosis present

## 2014-06-03 DIAGNOSIS — J449 Chronic obstructive pulmonary disease, unspecified: Secondary | ICD-10-CM | POA: Diagnosis present

## 2014-06-03 DIAGNOSIS — Z833 Family history of diabetes mellitus: Secondary | ICD-10-CM | POA: Diagnosis not present

## 2014-06-03 DIAGNOSIS — J9601 Acute respiratory failure with hypoxia: Secondary | ICD-10-CM | POA: Diagnosis present

## 2014-06-03 DIAGNOSIS — I4891 Unspecified atrial fibrillation: Secondary | ICD-10-CM | POA: Diagnosis present

## 2014-06-03 DIAGNOSIS — J441 Chronic obstructive pulmonary disease with (acute) exacerbation: Secondary | ICD-10-CM

## 2014-06-03 DIAGNOSIS — D45 Polycythemia vera: Secondary | ICD-10-CM | POA: Diagnosis present

## 2014-06-03 DIAGNOSIS — Z7901 Long term (current) use of anticoagulants: Secondary | ICD-10-CM | POA: Diagnosis not present

## 2014-06-03 DIAGNOSIS — J9602 Acute respiratory failure with hypercapnia: Secondary | ICD-10-CM | POA: Diagnosis present

## 2014-06-03 DIAGNOSIS — Z7951 Long term (current) use of inhaled steroids: Secondary | ICD-10-CM | POA: Diagnosis not present

## 2014-06-03 DIAGNOSIS — Z79899 Other long term (current) drug therapy: Secondary | ICD-10-CM | POA: Diagnosis not present

## 2014-06-03 DIAGNOSIS — Z885 Allergy status to narcotic agent status: Secondary | ICD-10-CM | POA: Diagnosis not present

## 2014-06-03 DIAGNOSIS — Z8249 Family history of ischemic heart disease and other diseases of the circulatory system: Secondary | ICD-10-CM | POA: Diagnosis not present

## 2014-06-03 DIAGNOSIS — E874 Mixed disorder of acid-base balance: Secondary | ICD-10-CM | POA: Diagnosis present

## 2014-06-03 DIAGNOSIS — R0603 Acute respiratory distress: Secondary | ICD-10-CM | POA: Diagnosis present

## 2014-06-03 DIAGNOSIS — F17210 Nicotine dependence, cigarettes, uncomplicated: Secondary | ICD-10-CM | POA: Diagnosis present

## 2014-06-03 DIAGNOSIS — I509 Heart failure, unspecified: Secondary | ICD-10-CM

## 2014-06-03 DIAGNOSIS — I5189 Other ill-defined heart diseases: Secondary | ICD-10-CM

## 2014-06-03 DIAGNOSIS — R0600 Dyspnea, unspecified: Secondary | ICD-10-CM | POA: Diagnosis present

## 2014-06-03 DIAGNOSIS — F1910 Other psychoactive substance abuse, uncomplicated: Secondary | ICD-10-CM | POA: Diagnosis present

## 2014-06-03 DIAGNOSIS — K219 Gastro-esophageal reflux disease without esophagitis: Secondary | ICD-10-CM | POA: Diagnosis present

## 2014-06-03 DIAGNOSIS — I5031 Acute diastolic (congestive) heart failure: Secondary | ICD-10-CM | POA: Diagnosis present

## 2014-06-03 LAB — CK: Total CK: 92 U/L (ref 7–232)

## 2014-06-03 LAB — CBC WITH DIFFERENTIAL/PLATELET
BASOS ABS: 0 10*3/uL (ref 0.0–0.1)
Basophils Relative: 0 % (ref 0–1)
EOS ABS: 0.1 10*3/uL (ref 0.0–0.7)
Eosinophils Relative: 2 % (ref 0–5)
HCT: 43.6 % (ref 39.0–52.0)
Hemoglobin: 13.8 g/dL (ref 13.0–17.0)
LYMPHS ABS: 2.8 10*3/uL (ref 0.7–4.0)
LYMPHS PCT: 40 % (ref 12–46)
MCH: 40.5 pg — ABNORMAL HIGH (ref 26.0–34.0)
MCHC: 31.7 g/dL (ref 30.0–36.0)
MCV: 127.9 fL — ABNORMAL HIGH (ref 78.0–100.0)
Monocytes Absolute: 0.6 10*3/uL (ref 0.1–1.0)
Monocytes Relative: 8 % (ref 3–12)
NEUTROS ABS: 3.4 10*3/uL (ref 1.7–7.7)
Neutrophils Relative %: 50 % (ref 43–77)
Platelets: 161 10*3/uL (ref 150–400)
RBC: 3.41 MIL/uL — ABNORMAL LOW (ref 4.22–5.81)
RDW: 14.3 % (ref 11.5–15.5)
WBC: 6.9 10*3/uL (ref 4.0–10.5)

## 2014-06-03 LAB — RAPID URINE DRUG SCREEN, HOSP PERFORMED
Amphetamines: NOT DETECTED
Barbiturates: NOT DETECTED
Benzodiazepines: NOT DETECTED
COCAINE: NOT DETECTED
Opiates: NOT DETECTED
Tetrahydrocannabinol: NOT DETECTED

## 2014-06-03 LAB — URINALYSIS, ROUTINE W REFLEX MICROSCOPIC
Bilirubin Urine: NEGATIVE
Glucose, UA: NEGATIVE mg/dL
Ketones, ur: 15 mg/dL — AB
Leukocytes, UA: NEGATIVE
NITRITE: NEGATIVE
Protein, ur: NEGATIVE mg/dL
SPECIFIC GRAVITY, URINE: 1.026 (ref 1.005–1.030)
Urobilinogen, UA: 0.2 mg/dL (ref 0.0–1.0)
pH: 5 (ref 5.0–8.0)

## 2014-06-03 LAB — I-STAT ARTERIAL BLOOD GAS, ED
ACID-BASE DEFICIT: 4 mmol/L — AB (ref 0.0–2.0)
Bicarbonate: 24.9 mEq/L — ABNORMAL HIGH (ref 20.0–24.0)
Bicarbonate: 27 mEq/L — ABNORMAL HIGH (ref 20.0–24.0)
O2 Saturation: 95 %
O2 Saturation: 87 %
PH ART: 7.225 — AB (ref 7.350–7.450)
PO2 ART: 89 mmHg (ref 80.0–100.0)
TCO2: 27 mmol/L (ref 0–100)
TCO2: 28 mmol/L (ref 0–100)
pCO2 arterial: 60 mmHg (ref 35.0–45.0)
pCO2 arterial: 50 mmHg — ABNORMAL HIGH (ref 35.0–45.0)
pH, Arterial: 7.338 — ABNORMAL LOW (ref 7.350–7.450)
pO2, Arterial: 56 mmHg — ABNORMAL LOW (ref 80.0–100.0)

## 2014-06-03 LAB — URINE MICROSCOPIC-ADD ON

## 2014-06-03 LAB — COMPREHENSIVE METABOLIC PANEL
ALBUMIN: 3.3 g/dL — AB (ref 3.5–5.2)
ALK PHOS: 128 U/L — AB (ref 39–117)
ALT: 27 U/L (ref 0–53)
AST: 40 U/L — AB (ref 0–37)
Anion gap: 20 — ABNORMAL HIGH (ref 5–15)
BUN: 22 mg/dL (ref 6–23)
CALCIUM: 8.5 mg/dL (ref 8.4–10.5)
CO2: 19 mEq/L (ref 19–32)
Chloride: 102 mEq/L (ref 96–112)
Creatinine, Ser: 1.15 mg/dL (ref 0.50–1.35)
GFR calc Af Amer: 75 mL/min — ABNORMAL LOW (ref >90)
GFR calc non Af Amer: 65 mL/min — ABNORMAL LOW (ref >90)
GLUCOSE: 240 mg/dL — AB (ref 70–99)
POTASSIUM: 4.5 meq/L (ref 3.7–5.3)
SODIUM: 141 meq/L (ref 137–147)
TOTAL PROTEIN: 7.2 g/dL (ref 6.0–8.3)
Total Bilirubin: 0.5 mg/dL (ref 0.3–1.2)

## 2014-06-03 LAB — TROPONIN I
Troponin I: <0.3 ng/mL (ref <0.30)
Troponin I: <0.3 ng/mL (ref <0.30)

## 2014-06-03 LAB — LIPASE, BLOOD: Lipase: 76 U/L — ABNORMAL HIGH (ref 11–59)

## 2014-06-03 LAB — CHLORIDE, URINE, RANDOM: Chloride Urine: 95 mEq/L

## 2014-06-03 LAB — PROTIME-INR
INR: 2.11 — AB (ref 0.00–1.49)
Prothrombin Time: 23.8 seconds — ABNORMAL HIGH (ref 11.6–15.2)

## 2014-06-03 LAB — NA AND K (SODIUM & POTASSIUM), RAND UR
POTASSIUM UR: 80 meq/L
Sodium, Ur: 92 mEq/L

## 2014-06-03 LAB — I-STAT TROPONIN, ED: Troponin i, poc: 0.02 ng/mL (ref 0.00–0.08)

## 2014-06-03 LAB — ETHANOL: Alcohol, Ethyl (B): <11 mg/dL (ref 0–11)

## 2014-06-03 LAB — SALICYLATE LEVEL: Salicylate Lvl: <2 mg/dL — ABNORMAL LOW (ref 2.8–20.0)

## 2014-06-03 LAB — MRSA PCR SCREENING: MRSA BY PCR: NEGATIVE

## 2014-06-03 LAB — PRO B NATRIURETIC PEPTIDE: PRO B NATRI PEPTIDE: 3336 pg/mL — AB (ref 0–125)

## 2014-06-03 LAB — LACTIC ACID, PLASMA: Lactic Acid, Venous: 1 mmol/L (ref 0.5–2.2)

## 2014-06-03 MED ORDER — HYDROXYUREA 500 MG PO CAPS
1500.0000 mg | ORAL_CAPSULE | Freq: Every day | ORAL | Status: DC
Start: 2014-06-04 — End: 2014-06-04
  Administered 2014-06-04: 1500 mg via ORAL
  Filled 2014-06-03: qty 3

## 2014-06-03 MED ORDER — NALOXONE HCL 0.4 MG/ML IJ SOLN
0.1000 mg | Freq: Once | INTRAMUSCULAR | Status: AC
  Administered 2014-06-03: 0.1 mg via INTRAVENOUS
  Filled 2014-06-03: qty 1

## 2014-06-03 MED ORDER — IPRATROPIUM-ALBUTEROL 0.5-2.5 (3) MG/3ML IN SOLN: 3.0000 mL | Freq: Four times a day (QID) | RESPIRATORY_TRACT | Status: DC | PRN

## 2014-06-03 MED ORDER — FLUTICASONE PROPIONATE HFA 44 MCG/ACT IN AERO
2.0000 | INHALATION_SPRAY | Freq: Two times a day (BID) | RESPIRATORY_TRACT | Status: DC
  Administered 2014-06-03 – 2014-06-04 (×2): 2 via RESPIRATORY_TRACT
  Filled 2014-06-03: qty 10.6

## 2014-06-03 MED ORDER — CARVEDILOL 12.5 MG PO TABS
12.5000 mg | ORAL_TABLET | Freq: Two times a day (BID) | ORAL | Status: DC
  Administered 2014-06-03 – 2014-06-04 (×3): 12.5 mg via ORAL
  Filled 2014-06-03 (×4): qty 1

## 2014-06-03 MED ORDER — HYDROXYUREA 500 MG PO CAPS: 1500.0000 mg | ORAL_CAPSULE | Freq: Every day | ORAL | Status: DC

## 2014-06-03 MED ORDER — TIOTROPIUM BROMIDE MONOHYDRATE 18 MCG IN CAPS
18.0000 ug | ORAL_CAPSULE | Freq: Every day | RESPIRATORY_TRACT | Status: DC
  Administered 2014-06-03 – 2014-06-04 (×2): 18 ug via RESPIRATORY_TRACT
  Filled 2014-06-03: qty 5

## 2014-06-03 MED ORDER — ENOXAPARIN SODIUM 40 MG/0.4ML ~~LOC~~ SOLN
40.0000 mg | SUBCUTANEOUS | Status: DC
  Administered 2014-06-03: 40 mg via SUBCUTANEOUS
  Filled 2014-06-03 (×2): qty 0.4

## 2014-06-03 MED ORDER — AMIODARONE HCL 200 MG PO TABS
200.0000 mg | ORAL_TABLET | Freq: Every day | ORAL | Status: DC
  Administered 2014-06-04: 200 mg via ORAL
  Filled 2014-06-03 (×2): qty 1

## 2014-06-03 MED ORDER — METHADONE HCL 10 MG PO TABS
10.0000 mg | ORAL_TABLET | Freq: Every day | ORAL | Status: DC
  Administered 2014-06-03 – 2014-06-04 (×2): 10 mg via ORAL
  Filled 2014-06-03 (×2): qty 1

## 2014-06-03 MED ORDER — FUROSEMIDE 10 MG/ML IJ SOLN
40.0000 mg | Freq: Once | INTRAMUSCULAR | Status: AC
  Administered 2014-06-03: 40 mg via INTRAVENOUS
  Filled 2014-06-03: qty 4

## 2014-06-03 NOTE — ED Notes (Signed)
Tolerating bipap, resting/sleeping on Bipap, opened eyes to command, drifts back to sleep.

## 2014-06-03 NOTE — ED Provider Notes (Signed)
CSN: [id number]     Arrival date & time 06/03/14  8127 History   First MD Initiated Contact with Patient 06/03/14 [phone]     Chief Complaint  Patient presents with  . Shortness of Breath     (Consider location/radiation/quality/duration/timing/severity/associated sxs/prior Treatment) HPI 68 year old male presents to the emergency department via EMS after being found short of breath and diaphoretic in a car outside of her residence.  EMS reports people at the residence reported not to know his past medical history, but do report that he recently had pneumonia.  Initial sats for EMS were in the low 70s, improved to mid 80s with C Pap.  Initial blood pressure elevated to 10 or 150, given nitroglycerin.  Patient is on amiodarone and Coumadin,  Patient unable to give further history. Past Medical History  Diagnosis Date  . Cardiomyopathy     EF55% 11/14<<35%   . Substance abuse     alcohol  . Hypertension   . Atrial fibrillation /flutter     hx of flutter ablation 2/12  . Shortness of breath     "all the time lately" (12/14/2012)  . GERD (gastroesophageal reflux disease)   . Hepatitis     "think I had that once; a long time ago; from dirty needles I think" (12/14/2012)  . 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 Leukocytosis, unspecified 06/12/2013  . 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 Obesity   . Syncope   . Syncope    Past Surgical History  Procedure Laterality Date  . Hemorrhoid surgery  1970's  . Cardiac electrophysiology mapping and ablation  08/2010    [name] 09/07/2010 (12/14/2012)  . Loop recorder implant  08-22-2013    MDT LinQ implanted by Dr Caryl Comes for syncope   Family History  Problem Relation Age of Onset  . Diabetes Mother   . Hypertension Mother   . Cirrhosis Father   . Alcohol abuse Father    History  Substance Use Topics  . Smoking status: Current Every Day Smoker -- 0.20 packs/day for 40 years    Types: Cigarettes  . Smokeless tobacco: Never Used   Comment: patient smoked 1-2ppd x 50 years, now smoking approx 4-5 cigs per day x 1 year  . Alcohol Use: 0.0 oz/week    0 Not specified per week     Comment: drinks a beer once in a while for the past year, but has h/o heavy alcohol use for many years before this    Review of Systems  Unable to perform ROS: Acuity of condition      Allergies  Codeine  Home Medications   Prior to Admission medications   Medication Sig Start Date End Date Taking? Authorizing Provider  albuterol (PROAIR HFA) 108 (90 BASE) MCG/ACT inhaler Inhale 1-2 puffs into the lungs daily as needed for wheezing or shortness of breath. 05/28/14  Yes Cresenciano Genre, MD  amiodarone (PACERONE) 200 MG tablet Take 1 tablet (200 mg total) by mouth daily. 03/19/14  Yes Cresenciano Genre, MD  Budesonide (PULMICORT FLEXHALER) 90 MCG/ACT inhaler Inhale 1 puff into the lungs every evening.   Yes Historical Provider, MD  carvedilol (COREG) 12.5 MG tablet Take 1 tablet (12.5 mg total) by mouth 2 (two) times daily with a meal. 04/02/14  Yes Cresenciano Genre, MD  hydroxyurea (HYDREA) 500 MG capsule take 3 capsules by mouth once daily *MAY TAKE WITH FOOD TO MINIMIZE GI SIDE EFFECTS 03/22/14  Yes Heath Lark, MD  methadone (DOLOPHINE) 10 MG tablet Take  1 tablet (10 mg total) by mouth daily. Verified for patient with [facility] on [address] 05/03/14  Yes Kinnie Feil, MD  tiotropium (SPIRIVA HANDIHALER) 18 MCG inhalation capsule Place 1 capsule (18 mcg total) into inhaler and inhale daily. 05/03/14  Yes Kinnie Feil, MD  warfarin (COUMADIN) 5 MG tablet Take 2.5-5 mg by mouth See admin instructions. Take 1 tablet Monday-Friday. Take 0.5 tablet on Saturday and Sunday.   Yes Historical Provider, MD  amoxicillin-clavulanate (AUGMENTIN) 875-125 MG per tablet Take 1 tablet by mouth 2 (two) times daily. Patient not taking: Reported on 06/03/2014 05/03/14   Kinnie Feil, MD  predniSONE (DELTASONE) 10 MG tablet Take 1 tablet (10 mg total)  by mouth daily with breakfast. Patient not taking: Reported on 06/03/2014 05/03/14   Kinnie Feil, MD   BP 123/72 mmHg  Pulse 55  Temp(Src) 97.5 F (36.4 C) (Oral)  Resp 12  SpO2 98% Physical Exam  Constitutional: He is oriented to person, place, and time. He appears well-developed and well-nourished. He appears distressed.  HENT:  Head: Normocephalic and atraumatic.  Nose: Nose normal.  Mouth/Throat: Oropharynx is clear and moist.  Eyes: Conjunctivae and EOM are normal. Pupils are equal, round, and reactive to light.  Neck: Normal range of motion. Neck supple. No JVD present. No tracheal deviation present. No thyromegaly present.  Cardiovascular: Normal rate, regular rhythm, normal heart sounds and intact distal pulses.  Exam reveals no gallop and no friction rub.   No murmur heard. Pulmonary/Chest: Effort normal. No stridor. No respiratory distress. He has wheezes. He has no rales. He exhibits no tenderness.  Diffuse rhonchi  Abdominal: Soft. Bowel sounds are normal. He exhibits no distension and no mass. There is no tenderness. There is no rebound and no guarding.  Musculoskeletal: Normal range of motion. He exhibits edema. He exhibits no tenderness.  Lymphadenopathy:    He has no cervical adenopathy.  Neurological: He is alert and oriented to person, place, and time. He displays normal reflexes. He exhibits normal muscle tone. Coordination normal.  Skin: Skin is warm. No rash noted. He is diaphoretic. No erythema. No pallor.  Psychiatric: He has a normal mood and affect. His behavior is normal. Judgment and thought content normal.  Nursing note and vitals reviewed.   ED Course  Procedures (including critical care time) Labs Review Labs Reviewed  PROTIME-INR - Abnormal; Notable for the following:    Prothrombin Time 23.8 (*)    INR 2.11 (*)    All other components within normal limits  PRO B NATRIURETIC PEPTIDE - Abnormal; Notable for the following:    Pro B Natriuretic  peptide (BNP) 3336.0 (*)    All other components within normal limits  COMPREHENSIVE METABOLIC PANEL - Abnormal; Notable for the following:    Glucose, Bld 240 (*)    Albumin 3.3 (*)    AST 40 (*)    Alkaline Phosphatase 128 (*)    GFR calc non Af Amer 65 (*)    GFR calc Af Amer 75 (*)    Anion gap 20 (*)    All other components within normal limits  LIPASE, BLOOD - Abnormal; Notable for the following:    Lipase 76 (*)    All other components within normal limits  CBC WITH DIFFERENTIAL - Abnormal; Notable for the following:    RBC 3.41 (*)    MCV 127.9 (*)    MCH 40.5 (*)    All other components within normal limits  I-STAT ARTERIAL BLOOD GAS, ED - Abnormal; Notable for the following:    pH, Arterial 7.225 (*)    pCO2 arterial 60.0 (*)    Bicarbonate 24.9 (*)    Acid-base deficit 4.0 (*)    All other components within normal limits  I-STAT ARTERIAL BLOOD GAS, ED - Abnormal; Notable for the following:    pH, Arterial 7.338 (*)    pCO2 arterial 50.0 (*)    pO2, Arterial 56.0 (*)    Bicarbonate 27.0 (*)    All other components within normal limits  CULTURE, BLOOD (ROUTINE X 2)  CULTURE, BLOOD (ROUTINE X 2)  ETHANOL  TROPONIN I  CBC WITH DIFFERENTIAL  URINE RAPID DRUG SCREEN (HOSP PERFORMED)  URINALYSIS, ROUTINE W REFLEX MICROSCOPIC  I-STAT TROPOININ, ED    Imaging Review Dg Chest Port 1 View  06/03/2014   CLINICAL DATA:  68 year old male is with shortness of breath, found unresponsive in is a vehicle.  EXAM: PORTABLE CHEST - 1 VIEW  COMPARISON:  Prior chest x-ray 05/03/2014  FINDINGS: Interval enlargement of the cardiopericardial silhouette. Atherosclerotic calcification present in the transverse aorta. Increased pulmonary vascular congestion now with mild interstitial edema. Small right pleural effusion. An implantable loop recorder projects over the left chest. No pneumothorax. No acute osseous abnormality. Osteoarthritis the right glenohumeral joint again noted.   IMPRESSION: 1. Interval enlargement of the cardiac silhouette accompanied by pulmonary edema consistent with acute mild-moderate CHF. 2. Small right pleural effusion.   Electronically Signed   By: Jacqulynn Cadet M.D.   On: 06/03/2014 07:17     EKG Interpretation   Date/Time:  Sunday June 03 2014 06:25:11 EST Ventricular Rate:  79 PR Interval:  192 QRS Duration: 105 QT Interval:  423 QTC Calculation: 485 R Axis:   37 Text Interpretation:  Sinus rhythm Left atrial enlargement Borderline  prolonged QT interval Since last tracing rate slower Confirmed by Jeroline Wolbert   MD, Peregrine Nolt ([id number]) on 06/03/2014 6:28:27 AM     CRITICAL CARE Performed by: Kalman Drape Total critical care time: 90 min Critical care time was exclusive of separately billable procedures and treating other patients. Critical care was necessary to treat or prevent imminent or life-threatening deterioration. Critical care was time spent personally by me on the following activities: development of treatment plan with patient and/or surrogate as well as nursing, discussions with consultants, evaluation of patient's response to treatment, examination of patient, obtaining history from patient or surrogate, ordering and performing treatments and interventions, ordering and review of laboratory studies, ordering and review of radiographic studies, pulse oximetry and re-evaluation of patient's condition.  MDM   Final diagnoses:  Respiratory distress  COPD exacerbation  Chronic congestive heart failure, unspecified congestive heart failure type  Disorientation    68 year old male who presents in acute respiratory distress improving on BiPAP.  Initial gas shows hypercarbia.  Patient not responding to increase in respiratory rate on BiPAP, patient given 0.1 mg of Narcan with improved alertness.  Concern he may have used additional opiates or other sedating medication causing his AMS and shortness of breath.  Chest x-ray with concern  for CHF.  BNP elevated.  Patient has been seen briefly by critical care who feels that he is stable at this time and do not feel he needs to be admitted to their service.  We'll talk with outpatient clinics for admission.  Pt now reports he took methadone yesterday and was on his way to methadone clinic this am.      Sharlett Iles  Sharol Given, MD 06/03/14 [phone]

## 2014-06-03 NOTE — ED Notes (Signed)
RT at [facility]. Pt answering pharm med rec techs questions.

## 2014-06-03 NOTE — ED Notes (Signed)
Admitting MDs at bedside.

## 2014-06-03 NOTE — ED Notes (Signed)
Pt denture found in ED in pink cup. Taken to floor 3E and given to patient.

## 2014-06-03 NOTE — ED Notes (Signed)
After giving narcan, pt called to have a bowel movement. Refused bed pan, taken to bathroom in wheelchair. Pt is more awake Denies pain.

## 2014-06-03 NOTE — H&P (Signed)
Date: 06/03/2014               Patient Name:  [name] MRN: [id number]  DOB: 1945-10-31 Age / Sex: 68 y.o., male   PCP: Cresenciano Genre, MD         Medical Service: Internal Medicine Teaching Service         Attending Physician: Dr. Aldine Contes, MD    First Contact: Dr. Charlott Rakes Pager: [phone]  Second Contact: Dr. Bing Neighbors Pager: [phone]       After Hours (After 5p/  First Contact Pager: [phone]  weekends / holidays): Second Contact Pager: [phone]   Chief Complaint: altered mental status  History of Present Illness: [name] is a 68 year old male with a-fib on amiodarone & warfarin (INR 3 on 11/9), h/o polysubstance abuse (EtOH, heroin, tobacco), HTN, CHF (EF 27-74%, grade 1 diastolic dysfunction) who presented with altered mental status. He is responsive at the time of interview.  He was found by EMS to be short of breath and diaphoretic in a car outside his house; initial O2 sats were in the low 70s though improved to mid 80s with CPAP. In the ED, his respiratory status improved with Narcan, and he was started on BiPAP.   On interview, he reports feeling short of breath this morning as we went to heat up his car to go to the methadone clinic but does not remember anything after getting in the car. He reports taking his medications last night but denies any recent sick contacts, chest pain, headache, changes in vision, dizziness, diaphoresis, abdominal pain, diarrhea, nausea, vomiting, cough.   He was recently hospitalized 10/11-10/15 for acute hypoxic respiratory failure 2/2 pneumonia and required intubation. He was presumed to have COPD and treated with prednisone taper and antibiotics though respiratory virus panel was rhinovirus+. He was discharged on Augmentin, and his methadone was decreased 30mg ->10mg . On 11/11, he completed PFTs notable for FEV1/FVC 0.58, FEV1 45% of predicted. He has smoked 1-2 packs/day for 50 years but is now down to  4-5 cigarettes. Last heroin use was 5-6 months ago, and he drinks a "swog" every once in awhile.  Meds: No current facility-administered medications for this encounter.   Current Outpatient Prescriptions  Medication Sig Dispense Refill  . albuterol (PROAIR HFA) 108 (90 BASE) MCG/ACT inhaler Inhale 1-2 puffs into the lungs daily as needed for wheezing or shortness of breath. 18 g 5  . amiodarone (PACERONE) 200 MG tablet Take 1 tablet (200 mg total) by mouth daily. 90 tablet 0  . Budesonide (PULMICORT FLEXHALER) 90 MCG/ACT inhaler Inhale 1 puff into the lungs every evening.    . carvedilol (COREG) 12.5 MG tablet Take 1 tablet (12.5 mg total) by mouth 2 (two) times daily with a meal. 60 tablet 2  . hydroxyurea (HYDREA) 500 MG capsule take 3 capsules by mouth once daily *MAY TAKE WITH FOOD TO MINIMIZE GI SIDE EFFECTS 90 capsule 3  . methadone (DOLOPHINE) 10 MG tablet Take 1 tablet (10 mg total) by mouth daily. Verified for patient with [facility] on [address] 30 tablet 0  . tiotropium (SPIRIVA HANDIHALER) 18 MCG inhalation capsule Place 1 capsule (18 mcg total) into inhaler and inhale daily. 30 capsule 12  . warfarin (COUMADIN) 5 MG tablet Take 2.5-5 mg by mouth See admin instructions. Take 1 tablet Monday-Friday. Take 0.5 tablet on Saturday and Sunday.    [name] amoxicillin-clavulanate (AUGMENTIN) 875-125 MG per tablet Take 1 tablet by mouth 2 (  two) times daily. (Patient not taking: Reported on 06/03/2014) 6 tablet 0  . predniSONE (DELTASONE) 10 MG tablet Take 1 tablet (10 mg total) by mouth daily with breakfast. (Patient not taking: Reported on 06/03/2014) 25 tablet 0    Allergies: Allergies as of 06/03/2014 - Review Complete 06/03/2014  Allergen Reaction Noted  . Codeine Rash 09/30/2010   Past Medical History  Diagnosis Date  . Cardiomyopathy     EF55% 11/14<<35%   . Substance abuse     alcohol  . Hypertension   . Atrial fibrillation /flutter     hx of flutter ablation 2/12  .  Shortness of breath     "all the time lately" (12/14/2012)  . GERD (gastroesophageal reflux disease)   . Hepatitis     "think I had that once; a long time ago; from dirty needles I think" (12/14/2012)  . 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 Leukocytosis, unspecified 06/12/2013  . 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 Obesity   . Syncope   . Syncope    Past Surgical History  Procedure Laterality Date  . Hemorrhoid surgery  1970's  . Cardiac electrophysiology mapping and ablation  08/2010    [name] 09/07/2010 (12/14/2012)  . Loop recorder implant  08-22-2013    MDT LinQ implanted by Dr Caryl Comes for syncope   Family History  Problem Relation Age of Onset  . Diabetes Mother   . Hypertension Mother   . Cirrhosis Father   . Alcohol abuse Father    History   Social History  . Marital Status: Single    Spouse Name: N/A    Number of Children: N/A  . Years of Education: N/A   Occupational History  . Not on file.   Social History Main Topics  . Smoking status: Current Every Day Smoker -- 0.20 packs/day for 40 years    Types: Cigarettes  . Smokeless tobacco: Never Used     Comment: patient smoked 1-2ppd x 50 years, now smoking approx 4-5 cigs per day x 1 year  . Alcohol Use: 0.0 oz/week    0 Not specified per week     Comment: drinks a beer once in a while for the past year, but has h/o heavy alcohol use for many years before this  . Drug Use: Yes    Special: "Crack" cocaine, Cocaine, Heroin     Comment: 03/20/2013 "been clean for awhile"; endorses using heroin "all my life" up until about a year ago  . Sexual Activity: Not Currently   Other Topics Concern  . Not on file   Social History Narrative   Moved here from [place]. Lives with common law wife and grandchildren. He is retired from "general labor."    Review of Systems: Review of Systems  Constitutional: Negative for fever and diaphoresis.  Respiratory: Positive for shortness of breath. Negative for cough and sputum  production.   Cardiovascular: Positive for leg swelling. Negative for chest pain, palpitations and PND.  Gastrointestinal: Negative for nausea, vomiting, abdominal pain and diarrhea.  Neurological: Positive for loss of consciousness. Negative for dizziness and headaches.   Physical Exam: Blood pressure 114/62, pulse 48, temperature 97.5 F (36.4 C), temperature source Oral, resp. rate 18, SpO2 98 %. General: resting in bed, NAD, 2L O2 by [place] HEENT: PERRL, EOMI, no scleral icterus, oropharynx clear Cardiac: difficult to auscultate heart sounds Pulm: R > L rhonchi Abd: soft, nontender, nondistended, BS present, hepatojugular reflex+ Ext: warm and well perfused, trace 1+ pitting edema Neuro: alert and  oriented X3, cranial nerves II-XII intact, brachial and patellar reflexes 2+, bilateral UE/LE strength 5/5   Lab results: Basic Metabolic Panel:  Recent Labs  06/03/14 0623  NA 141  K 4.5  CL 102  CO2 19  GLUCOSE 240*  BUN 22  CREATININE 1.15  CALCIUM 8.5   Liver Function Tests:  Recent Labs  06/03/14 0623  AST 40*  ALT 27  ALKPHOS 128*  BILITOT 0.5  PROT 7.2  ALBUMIN 3.3*    Recent Labs  06/03/14 0623  LIPASE 76*   CBC:  Recent Labs  06/03/14 0622  WBC 6.9  NEUTROABS 3.4  HGB 13.8  HCT 43.6  MCV 127.9*  PLT 161   Cardiac Enzymes:  Recent Labs  06/03/14 0623  TROPONINI <0.30   BNP:  Recent Labs  06/03/14 0622  PROBNP 3336.0*   Coagulation:  Recent Labs  06/03/14 0623  LABPROT 23.8*  INR 2.11*   Urine Drug Screen: Drugs of Abuse     Component Value Date/Time   LABOPIA NONE DETECTED 06/03/2014 1155   LABOPIA NEGATIVE 04/29/2014 0933   COCAINSCRNUR NONE DETECTED 06/03/2014 1155   COCAINSCRNUR NEGATIVE 04/29/2014 0933   LABBENZ NONE DETECTED 06/03/2014 1155   LABBENZ NEGATIVE 04/29/2014 0933   AMPHETMU NONE DETECTED 06/03/2014 1155   AMPHETMU NEGATIVE 04/29/2014 0933   THCU NONE DETECTED 06/03/2014 1155   LABBARB NONE DETECTED  06/03/2014 1155    Alcohol Level:  Recent Labs  06/03/14 0623  ETH <11   Urinalysis:  Recent Labs  06/03/14 1156  COLORURINE YELLOW  LABSPEC 1.026  PHURINE 5.0  GLUCOSEU NEGATIVE  HGBUR MODERATE*  BILIRUBINUR NEGATIVE  KETONESUR 15*  PROTEINUR NEGATIVE  UROBILINOGEN 0.2  NITRITE NEGATIVE  LEUKOCYTESUR NEGATIVE    Imaging results:  Dg Chest Port 1 View  06/03/2014   CLINICAL DATA:  68 year old male is with shortness of breath, found unresponsive in is a vehicle.  EXAM: PORTABLE CHEST - 1 VIEW  COMPARISON:  Prior chest x-ray 05/03/2014  FINDINGS: Interval enlargement of the cardiopericardial silhouette. Atherosclerotic calcification present in the transverse aorta. Increased pulmonary vascular congestion now with mild interstitial edema. Small right pleural effusion. An implantable loop recorder projects over the left chest. No pneumothorax. No acute osseous abnormality. Osteoarthritis the right glenohumeral joint again noted.  IMPRESSION: 1. Interval enlargement of the cardiac silhouette accompanied by pulmonary edema consistent with acute mild-moderate CHF. 2. Small right pleural effusion.   Electronically Signed   By: Jacqulynn Cadet M.D.   On: 06/03/2014 07:17    Other results: EKG: Reviewed and compared with 01/12/14 Normal rate Normal sinus rhythm Prolonged QTc: 0.485s  Assessment & Plan by Problem:  Mr. Draysen Weygandt is a 68 year old male with a-fib on amiodarone & warfarin (INR 3 on 11/9), h/o polysubstance abuse (EtOH, heroin, tobacco), HTN, CHF (EF 41-74%, grade 1 diastolic dysfunction), polycythemia, COPD who presented with altered mental status.  Altered mental status: Anion gap metabolic acidosis with concomitant respiratory acidosis (pH low, CO2 above normal) on admission labs which makes overdose of some sort suspect, possibly ethylene glycol, though UA only remarkable for hemoglobinuria. Glucose has been persistently elevated with urinary ketones possibly  2/2 undiagnosed DM. CXR findings concerning for acute CHF exacerbation; BNP elevated and similar to the one back in Sep 2014 when he was hospitalized for that diagnosis.  -Check urine drug screen -Check CK -Check salicylates -Check urine electrolytes -Trend troponins x 3 -Repeat EKG & BMET AM  CHF: CXR findings and elevated BNP are  consistent for an acute exacerbation. -Give Lasix 40mg   -Monitor I&O & daily weights  COPD: No wheezing on exam though rhonchi present. -Continue Spiriva, Flovent, Duonebs q6h prn.  Polysubstance abuse: He confirms his home methadone dose of 10mg . Otherwise, as noted above.  A-fib: Continue amiodarone & carvedilol. -Consult Cardiology to interrogate his monitor  Polycythemia: Hb 13.8 on admission, baseline 18-20 but treated with hydroxyurea and phlebotomy. JAK-2 mutation+.  -Continue hydroxyurea 1500mg  QD  HTN: Continue carvedilol.  #FEN:  -Diet: Heart Healthy  #DVT prophylaxis: Lovenox  #CODE STATUS: FULL CODE   Dispo: Disposition is deferred at this time, awaiting improvement of current medical problems.   The patient does have a current PCP Cresenciano Genre, MD) and does need an [facility] hospital follow-up appointment after discharge.  The patient does have transportation limitations that hinder transportation to clinic appointments.  Signed: Charlott Rakes, MD 06/03/2014, 9:22 AM

## 2014-06-03 NOTE — ED Notes (Signed)
RT at BS for abg

## 2014-06-03 NOTE — ED Notes (Signed)
Pt arrives by EMS on CPAP in respiratory distress, Dr.Otter present at [facility] upon arrival. Pt pt found with decreased LOC and sob in vehicle, initial SPO2 on RA was 70-73%, improved to 87 on CPAP. BP elevated at 210/150 PTA. 1 ntg sl given PTA/EMS. LS rales throughout. No known hx for EMS. Takes amiodarone and coumadin. Edema present in all 4 extremities and abd. EMS called out at 0545. Arrives with decreased LOC.

## 2014-06-04 ENCOUNTER — Emergency Department (HOSPITAL_COMMUNITY): Payer: Medicare Other

## 2014-06-04 ENCOUNTER — Ambulatory Visit: Payer: Medicare Other

## 2014-06-04 ENCOUNTER — Inpatient Hospital Stay (HOSPITAL_BASED_OUTPATIENT_CLINIC_OR_DEPARTMENT_OTHER)
Admission: EM | Admit: 2014-06-04 | Discharge: 2014-06-08 | Disposition: A | Discharge status: Home / Self Care | Payer: Medicare Other | Source: Home / Self Care | Attending: Internal Medicine | Admitting: Internal Medicine

## 2014-06-04 ENCOUNTER — Encounter (HOSPITAL_COMMUNITY): Payer: Self-pay | Admitting: Adult Health

## 2014-06-04 DIAGNOSIS — I5031 Acute diastolic (congestive) heart failure: Secondary | ICD-10-CM | POA: Diagnosis present

## 2014-06-04 DIAGNOSIS — R4182 Altered mental status, unspecified: Secondary | ICD-10-CM

## 2014-06-04 DIAGNOSIS — F1120 Opioid dependence, uncomplicated: Secondary | ICD-10-CM | POA: Diagnosis not present

## 2014-06-04 DIAGNOSIS — I509 Heart failure, unspecified: Secondary | ICD-10-CM

## 2014-06-04 DIAGNOSIS — I5033 Acute on chronic diastolic (congestive) heart failure: Secondary | ICD-10-CM

## 2014-06-04 DIAGNOSIS — B1920 Unspecified viral hepatitis C without hepatic coma: Secondary | ICD-10-CM | POA: Insufficient documentation

## 2014-06-04 DIAGNOSIS — J9602 Acute respiratory failure with hypercapnia: Secondary | ICD-10-CM | POA: Insufficient documentation

## 2014-06-04 DIAGNOSIS — I4891 Unspecified atrial fibrillation: Secondary | ICD-10-CM | POA: Insufficient documentation

## 2014-06-04 DIAGNOSIS — R0602 Shortness of breath: Secondary | ICD-10-CM

## 2014-06-04 DIAGNOSIS — K219 Gastro-esophageal reflux disease without esophagitis: Secondary | ICD-10-CM

## 2014-06-04 DIAGNOSIS — R823 Hemoglobinuria: Secondary | ICD-10-CM

## 2014-06-04 DIAGNOSIS — C946 Myelodysplastic disease, not classified: Secondary | ICD-10-CM | POA: Insufficient documentation

## 2014-06-04 DIAGNOSIS — D45 Polycythemia vera: Secondary | ICD-10-CM | POA: Insufficient documentation

## 2014-06-04 DIAGNOSIS — R55 Syncope and collapse: Secondary | ICD-10-CM

## 2014-06-04 DIAGNOSIS — I4581 Long QT syndrome: Secondary | ICD-10-CM

## 2014-06-04 DIAGNOSIS — R001 Bradycardia, unspecified: Secondary | ICD-10-CM | POA: Insufficient documentation

## 2014-06-04 DIAGNOSIS — R0603 Acute respiratory distress: Secondary | ICD-10-CM

## 2014-06-04 DIAGNOSIS — J449 Chronic obstructive pulmonary disease, unspecified: Secondary | ICD-10-CM | POA: Diagnosis present

## 2014-06-04 DIAGNOSIS — J9601 Acute respiratory failure with hypoxia: Secondary | ICD-10-CM | POA: Diagnosis present

## 2014-06-04 DIAGNOSIS — I10 Essential (primary) hypertension: Secondary | ICD-10-CM | POA: Diagnosis present

## 2014-06-04 DIAGNOSIS — Z886 Allergy status to analgesic agent status: Secondary | ICD-10-CM | POA: Insufficient documentation

## 2014-06-04 DIAGNOSIS — Z87828 Personal history of other (healed) physical injury and trauma: Secondary | ICD-10-CM | POA: Insufficient documentation

## 2014-06-04 DIAGNOSIS — R9431 Abnormal electrocardiogram [ECG] [EKG]: Secondary | ICD-10-CM | POA: Diagnosis present

## 2014-06-04 DIAGNOSIS — Z7901 Long term (current) use of anticoagulants: Secondary | ICD-10-CM

## 2014-06-04 DIAGNOSIS — F17210 Nicotine dependence, cigarettes, uncomplicated: Secondary | ICD-10-CM | POA: Insufficient documentation

## 2014-06-04 DIAGNOSIS — I429 Cardiomyopathy, unspecified: Secondary | ICD-10-CM | POA: Insufficient documentation

## 2014-06-04 DIAGNOSIS — I161 Hypertensive emergency: Secondary | ICD-10-CM | POA: Diagnosis present

## 2014-06-04 DIAGNOSIS — F1910 Other psychoactive substance abuse, uncomplicated: Secondary | ICD-10-CM | POA: Insufficient documentation

## 2014-06-04 DIAGNOSIS — J810 Acute pulmonary edema: Secondary | ICD-10-CM | POA: Diagnosis present

## 2014-06-04 DIAGNOSIS — I712 Thoracic aortic aneurysm, without rupture: Secondary | ICD-10-CM | POA: Insufficient documentation

## 2014-06-04 DIAGNOSIS — E041 Nontoxic single thyroid nodule: Secondary | ICD-10-CM | POA: Insufficient documentation

## 2014-06-04 HISTORY — DX: Disorder of kidney and ureter, unspecified: N28.9

## 2014-06-04 LAB — APTT: aPTT: 31 seconds (ref 24–37)

## 2014-06-04 LAB — BASIC METABOLIC PANEL
ANION GAP: 11 (ref 5–15)
BUN: 28 mg/dL — ABNORMAL HIGH (ref 6–23)
CHLORIDE: 102 meq/L (ref 96–112)
CO2: 29 mEq/L (ref 19–32)
Calcium: 8.3 mg/dL — ABNORMAL LOW (ref 8.4–10.5)
Creatinine, Ser: 1.18 mg/dL (ref 0.50–1.35)
GFR calc Af Amer: 72 mL/min — ABNORMAL LOW (ref >90)
GFR calc non Af Amer: 62 mL/min — ABNORMAL LOW (ref >90)
Glucose, Bld: 90 mg/dL (ref 70–99)
Potassium: 3.9 mEq/L (ref 3.7–5.3)
Sodium: 142 mEq/L (ref 137–147)

## 2014-06-04 LAB — HEMOGLOBIN A1C
Hgb A1c MFr Bld: 5.1 % (ref <5.7)
Mean Plasma Glucose: 100 mg/dL (ref <117)

## 2014-06-04 LAB — CBC
HCT: 42 % (ref 39.0–52.0)
Hemoglobin: 13.6 g/dL (ref 13.0–17.0)
MCH: 39.4 pg — ABNORMAL HIGH (ref 26.0–34.0)
MCHC: 32.4 g/dL (ref 30.0–36.0)
MCV: 121.7 fL — ABNORMAL HIGH (ref 78.0–100.0)
PLATELETS: 148 10*3/uL — AB (ref 150–400)
RBC: 3.45 MIL/uL — ABNORMAL LOW (ref 4.22–5.81)
RDW: 13.7 % (ref 11.5–15.5)
WBC: 6.6 10*3/uL (ref 4.0–10.5)

## 2014-06-04 LAB — COMPREHENSIVE METABOLIC PANEL
ALT: 28 U/L (ref 0–53)
AST: 36 U/L (ref 0–37)
Albumin: 3.4 g/dL — ABNORMAL LOW (ref 3.5–5.2)
Alkaline Phosphatase: 133 U/L — ABNORMAL HIGH (ref 39–117)
Anion gap: 15 (ref 5–15)
BILIRUBIN TOTAL: 0.6 mg/dL (ref 0.3–1.2)
BUN: 27 mg/dL — ABNORMAL HIGH (ref 6–23)
CHLORIDE: 99 meq/L (ref 96–112)
CO2: 24 meq/L (ref 19–32)
Calcium: 8.3 mg/dL — ABNORMAL LOW (ref 8.4–10.5)
Creatinine, Ser: 1.27 mg/dL (ref 0.50–1.35)
GFR, EST AFRICAN AMERICAN: 66 mL/min — AB (ref >90)
GFR, EST NON AFRICAN AMERICAN: 57 mL/min — AB (ref >90)
Glucose, Bld: 167 mg/dL — ABNORMAL HIGH (ref 70–99)
Potassium: 4.3 mEq/L (ref 3.7–5.3)
SODIUM: 138 meq/L (ref 137–147)
Total Protein: 7.1 g/dL (ref 6.0–8.3)

## 2014-06-04 LAB — PRO B NATRIURETIC PEPTIDE: Pro B Natriuretic peptide (BNP): 2283 pg/mL — ABNORMAL HIGH (ref 0–125)

## 2014-06-04 LAB — I-STAT ARTERIAL BLOOD GAS, ED
ACID-BASE EXCESS: 3 mmol/L — AB (ref 0.0–2.0)
BICARBONATE: 30 meq/L — AB (ref 20.0–24.0)
O2 SAT: 98 %
PCO2 ART: 54.7 mmHg — AB (ref 35.0–45.0)
PO2 ART: 116 mmHg — AB (ref 80.0–100.0)
Patient temperature: 98.6
TCO2: 32 mmol/L (ref 0–100)
pH, Arterial: 7.347 — ABNORMAL LOW (ref 7.350–7.450)

## 2014-06-04 LAB — I-STAT TROPONIN, ED: Troponin i, poc: 0.01 ng/mL (ref 0.00–0.08)

## 2014-06-04 LAB — PROTIME-INR
INR: 1.88 — AB (ref 0.00–1.49)
INR: 1.73 — AB (ref 0.00–1.49)
PROTHROMBIN TIME: 21.8 s — AB (ref 11.6–15.2)
Prothrombin Time: 20.4 seconds — ABNORMAL HIGH (ref 11.6–15.2)

## 2014-06-04 LAB — TROPONIN I

## 2014-06-04 LAB — MRSA PCR SCREENING: MRSA by PCR: NEGATIVE

## 2014-06-04 MED ORDER — FUROSEMIDE 10 MG/ML IJ SOLN
60.0000 mg | Freq: Once | INTRAMUSCULAR | Status: DC
  Filled 2014-06-04: qty 6

## 2014-06-04 MED ORDER — NITROGLYCERIN IN D5W 200-5 MCG/ML-% IV SOLN
0.0000 ug/min | Freq: Once | INTRAVENOUS | Status: AC
  Administered 2014-06-04: 5 ug/min via INTRAVENOUS

## 2014-06-04 MED ORDER — CETYLPYRIDINIUM CHLORIDE 0.05 % MT LIQD
7.0000 mL | Freq: Two times a day (BID) | OROMUCOSAL | Status: DC
  Administered 2014-06-05 – 2014-06-06 (×2): 7 mL via OROMUCOSAL

## 2014-06-04 MED ORDER — NITROGLYCERIN IN D5W 200-5 MCG/ML-% IV SOLN
INTRAVENOUS | Status: AC
  Filled 2014-06-04: qty 250

## 2014-06-04 MED ORDER — METHYLPREDNISOLONE SODIUM SUCC 125 MG IJ SOLR
INTRAMUSCULAR | Status: AC
  Filled 2014-06-04: qty 2

## 2014-06-04 MED ORDER — NITROGLYCERIN 0.4 MG SL SUBL
SUBLINGUAL_TABLET | SUBLINGUAL | Status: AC
  Filled 2014-06-04: qty 3

## 2014-06-04 MED ORDER — FUROSEMIDE 10 MG/ML IJ SOLN
40.0000 mg | Freq: Once | INTRAMUSCULAR | Status: AC
  Administered 2014-06-04: 40 mg via INTRAVENOUS

## 2014-06-04 NOTE — Care Management Utilization Note (Signed)
UR completed 

## 2014-06-04 NOTE — Discharge Summary (Signed)
Name: [name] MRN: [id number] DOB: 1945/08/05 68 y.o. PCP: Cresenciano Genre, MD  Date of Admission: 06/03/2014  6:29 AM Date of Discharge: 06/04/2014 Attending Physician: Aldine Contes, MD  Discharge Diagnosis: Principal Problem:   Acute respiratory distress Active Problems:   Atrial fibrillation   Hypertension   Polycythemia vera   COPD (chronic obstructive pulmonary disease)   Congestive heart failure  Discharge Medications:   Medication List    STOP taking these medications        amiodarone 200 MG tablet  Commonly known as:  PACERONE     amoxicillin-clavulanate 875-125 MG per tablet  Commonly known as:  AUGMENTIN     methadone 10 MG tablet  Commonly known as:  DOLOPHINE     predniSONE 10 MG tablet  Commonly known as:  DELTASONE      TAKE these medications        albuterol 108 (90 BASE) MCG/ACT inhaler  Commonly known as:  PROAIR HFA  Inhale 1-2 puffs into the lungs daily as needed for wheezing or shortness of breath.     carvedilol 12.5 MG tablet  Commonly known as:  COREG  Take 1 tablet (12.5 mg total) by mouth 2 (two) times daily with a meal.     hydroxyurea 500 MG capsule  Commonly known as:  HYDREA  take 3 capsules by mouth once daily *MAY TAKE WITH FOOD TO MINIMIZE GI SIDE EFFECTS     PULMICORT FLEXHALER 90 MCG/ACT inhaler  Generic drug:  Budesonide  Inhale 1 puff into the lungs every evening.     tiotropium 18 MCG inhalation capsule  Commonly known as:  SPIRIVA HANDIHALER  Place 1 capsule (18 mcg total) into inhaler and inhale daily.     warfarin 5 MG tablet  Commonly known as:  COUMADIN  Take 2.5-5 mg by mouth See admin instructions. Take 2.5 mg (0.5 tablet) daily except 5 mg (1 tablet) on Mondays        Disposition and follow-up:   [name] was discharged from [facility] in Stable condition.  At the hospital follow up visit please address:  1.  Resolution of his hypoxia  2.   Labs / imaging needed at time of follow-up: none  3.  Pending labs/ test needing follow-up: none  Follow-up Appointments: Follow-up Information    Follow up with Duwaine Maxin, DO. Go on 06/12/2014.   Specialty:  Internal Medicine   Why:  345PM   Contact information:   [place] [address] [phone]       Discharge Instructions: Discharge Instructions    Call MD for:  persistant dizziness or light-headedness    Complete by:  As directed      Call MD for:  persistant nausea and vomiting    Complete by:  As directed      Call MD for:  severe uncontrolled pain    Complete by:  As directed      Call MD for:  temperature >100.4    Complete by:  As directed      Diet - low sodium heart healthy    Complete by:  As directed      Increase activity slowly    Complete by:  As directed            Consultations:    Procedures Performed:  X-ray Chest Pa And Lateral  06/05/2014   CLINICAL DATA:  68 year old male with acute shortness of Breath.  Initial encounter.  EXAM: CHEST  2 VIEW  COMPARISON:  06/04/2014 and earlier.  FINDINGS: Further regressed basilar predominant interstitial opacity, minimal residual. No pneumothorax, pleural effusion or consolidation. Mild cardiomegaly. Stable cardiac size and mediastinal contours. Visualized tracheal air column is within normal limits. Cardiac event recorder re - identified. No acute osseous abnormality identified.  IMPRESSION: Further regression of basilar predominant interstitial edema. No new cardiopulmonary abnormality.   Electronically Signed   By: Lars Pinks M.D.   On: 06/05/2014 08:16   Dg Chest Port 1 View  06/04/2014   CLINICAL DATA:  68 year old male with acute shortness of breath, respiratory distress. Recently discharged from hospital. Initial encounter.  EXAM: PORTABLE CHEST - 1 VIEW  COMPARISON:  06/03/2014 and earlier.  FINDINGS: Portable AP upright view at 1730 hrs. Mildly regressed basilar predominant interstitial  edema since yesterday. Stable cardiac size and mediastinal contours. No pneumothorax. No pleural effusion or consolidation.  IMPRESSION: Since yesterday regressed but not resolved basilar predominant interstitial opacity favored due to pulmonary edema. No new cardiopulmonary abnormality identified.   Electronically Signed   By: Lars Pinks M.D.   On: 06/04/2014 17:56   Dg Chest Port 1 View  06/03/2014   CLINICAL DATA:  68 year old male is with shortness of breath, found unresponsive in is a vehicle.  EXAM: PORTABLE CHEST - 1 VIEW  COMPARISON:  Prior chest x-ray 05/03/2014  FINDINGS: Interval enlargement of the cardiopericardial silhouette. Atherosclerotic calcification present in the transverse aorta. Increased pulmonary vascular congestion now with mild interstitial edema. Small right pleural effusion. An implantable loop recorder projects over the left chest. No pneumothorax. No acute osseous abnormality. Osteoarthritis the right glenohumeral joint again noted.  IMPRESSION: 1. Interval enlargement of the cardiac silhouette accompanied by pulmonary edema consistent with acute mild-moderate CHF. 2. Small right pleural effusion.   Electronically Signed   By: Jacqulynn Cadet M.D.   On: 06/03/2014 07:17    Admission HPI: [name] is a 68 year old male with a-fib on amiodarone & warfarin (INR 3 on 11/9), h/o polysubstance abuse (EtOH, heroin, tobacco), HTN, CHF (EF 33-29%, grade 1 diastolic dysfunction) who presented with altered mental status. He is responsive at the time of interview.  He was found by EMS to be short of breath and diaphoretic in a car outside his house; initial O2 sats were in the low 70s though improved to mid 80s with CPAP. In the ED, his respiratory status improved with Narcan, and he was started on BiPAP.   On interview, he reports feeling short of breath this morning as we went to heat up his car to go to the methadone clinic but does not remember anything after getting in  the car. He reports taking his medications last night but denies any recent sick contacts, chest pain, headache, changes in vision, dizziness, diaphoresis, abdominal pain, diarrhea, nausea, vomiting, cough.   He was recently hospitalized 10/11-10/15 for acute hypoxic respiratory failure 2/2 pneumonia and required intubation. He was presumed to have COPD and treated with prednisone taper and antibiotics though respiratory virus panel was rhinovirus+. He was discharged on Augmentin, and his methadone was decreased 30mg ->10mg . On 11/11, he completed PFTs notable for FEV1/FVC 0.58, FEV1 45% of predicted. He has smoked 1-2 packs/day for 50 years but is now down to 4-5 cigarettes. Last heroin use was 5-6 months ago, and he drinks a "swog" every once in awhile.   Hospital Course by problem list:  Altered mental status: Likely 2/2 hypoxia. He improved on BiPAP  and was transitioned to O2 which was then weaned down. He initially had an anion gap of 20 which resolved by the next day without IV fluids; lactate and salicylates were unremarkable. Troponins negative x 3 and stable EKG findings were reassuring to rule out ACS. On the day of discharge, he was mentating well and asked to follow-up in clinic.  CHF: CXR on admission was concerning for some mild to moderate interstitial edema. He was treated with Lasix 40mg  PO and diuresed 1.5L. On the day of discharge, he did show signs of hypervolemia and did not require O2.  Polysubstance abuse: He was continued on methadone 10mg  which was what he was discharged on at his last hospitalization and consistent with his report.  A-fib: Remained stable on home medications though he was given Lovenox instead of warfarin for DVT prophylaxis.  COPD: Remained stable on home medications.  Polycythemia vera: Remained stable on home medications.  HTN: Remained stable on home medications.  Discharge Vitals:   BP 150/72 mmHg  Pulse 59  Temp(Src) 98 F (36.7 C) (Oral)   Resp 18  Ht 5\' 6"  (1.676 m)  Wt 189 lb 13.1 oz (86.1 kg)  BMI 30.65 kg/m2  SpO2 98%  Discharge Labs:  No results found for this or any previous visit (from the past 24 hour(s)).  Signed: Charlott Rakes, MD 06/05/2014, 2:49 PM    Services Ordered on Discharge: None Equipment Ordered on Discharge: None

## 2014-06-04 NOTE — ED Provider Notes (Signed)
CSN: [id number]     Arrival date & time 06/04/14  1708 History   First MD Initiated Contact with Patient 06/04/14 1723     Chief Complaint  Patient presents with  . Respiratory Distress     (Consider location/radiation/quality/duration/timing/severity/associated sxs/prior Treatment) Patient is a 68 y.o. male presenting with shortness of breath. The history is provided by the patient. No language interpreter was used.  Shortness of Breath Severity:  Severe Onset quality:  Sudden Timing:  Constant Progression:  Worsening Chronicity:  Recurrent Context: not URI   Relieved by:  Oxygen Worsened by:  Exertion Ineffective treatments:  Rest Associated symptoms: no abdominal pain, no chest pain, no cough, no fever, no headaches, no rash, no sore throat, no sputum production and no vomiting   Risk factors comment:  Recent admission for CHF exacebation   Past Medical History  Diagnosis Date  . Cardiomyopathy     EF55% 11/14<<35%   . Substance abuse     alcohol  . Hypertension   . Atrial fibrillation /flutter     hx of flutter ablation 2/12  . Shortness of breath     "all the time lately" (12/14/2012)  . GERD (gastroesophageal reflux disease)   . Hepatitis     "think I had that once; a long time ago; from dirty needles I think" (12/14/2012)  . 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 Leukocytosis, unspecified 06/12/2013  . 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 Obesity   . Syncope   . Syncope    Past Surgical History  Procedure Laterality Date  . Hemorrhoid surgery  1970's  . Cardiac electrophysiology mapping and ablation  08/2010    [name] 09/07/2010 (12/14/2012)  . Loop recorder implant  08-22-2013    MDT LinQ implanted by Dr Caryl Comes for syncope   Family History  Problem Relation Age of Onset  . Diabetes Mother   . Hypertension Mother   . Cirrhosis Father   . Alcohol abuse Father    History  Substance Use Topics  . Smoking status: Current Every Day Smoker -- 0.20 packs/day  for 40 years    Types: Cigarettes  . Smokeless tobacco: Never Used     Comment: patient smoked 1-2ppd x 50 years, now smoking approx 4-5 cigs per day x 1 year  . Alcohol Use: 0.0 oz/week    0 Not specified per week     Comment: drinks a beer once in a while for the past year, but has h/o heavy alcohol use for many years before this    Review of Systems  Constitutional: Negative for fever.  HENT: Negative for congestion, rhinorrhea and sore throat.   Respiratory: Positive for shortness of breath. Negative for cough and sputum production.   Cardiovascular: Negative for chest pain.  Gastrointestinal: Negative for nausea, vomiting, abdominal pain and diarrhea.  Genitourinary: Negative for dysuria and hematuria.  Skin: Negative for rash.  Neurological: Negative for syncope, light-headedness and headaches.  All other systems reviewed and are negative.     Allergies  Codeine  Home Medications   Prior to Admission medications   Medication Sig Start Date End Date Taking? Authorizing Provider  albuterol (PROAIR HFA) 108 (90 BASE) MCG/ACT inhaler Inhale 1-2 puffs into the lungs daily as needed for wheezing or shortness of breath. 05/28/14   Cresenciano Genre, MD  amiodarone (PACERONE) 200 MG tablet Take 1 tablet (200 mg total) by mouth daily. 03/19/14   Cresenciano Genre, MD  Budesonide (PULMICORT FLEXHALER) 90 MCG/ACT inhaler Inhale 1 puff  into the lungs every evening.    Historical Provider, MD  carvedilol (COREG) 12.5 MG tablet Take 1 tablet (12.5 mg total) by mouth 2 (two) times daily with a meal. 04/02/14   Cresenciano Genre, MD  hydroxyurea (HYDREA) 500 MG capsule take 3 capsules by mouth once daily *MAY TAKE WITH FOOD TO MINIMIZE GI SIDE EFFECTS 03/22/14   Heath Lark, MD  methadone (DOLOPHINE) 10 MG tablet Take 1 tablet (10 mg total) by mouth daily. Verified for patient with [facility] on [address]. 05/03/14   Kinnie Feil, MD  predniSONE (DELTASONE) 10 MG tablet Take 1 tablet (10 mg  total) by mouth daily with breakfast. Patient not taking: Reported on 06/03/2014 05/03/14   Kinnie Feil, MD  tiotropium (SPIRIVA HANDIHALER) 18 MCG inhalation capsule Place 1 capsule (18 mcg total) into inhaler and inhale daily. 05/03/14   Kinnie Feil, MD  warfarin (COUMADIN) 5 MG tablet Take 2.5-5 mg by mouth See admin instructions. Take 1 tablet Monday-Friday. Take 0.5 tablet on Saturday and Sunday.    Historical Provider, MD   BP 158/86 mmHg  Pulse 99  Temp(Src) 98.1 F (36.7 C) (Temporal)  Resp 25  SpO2 100% Physical Exam  Constitutional: He is oriented to person, place, and time. He appears well-developed and well-nourished.  HENT:  Head: Normocephalic and atraumatic.  Right Ear: External ear normal.  Left Ear: External ear normal.  Eyes: EOM are normal.  Neck: Normal range of motion. Neck supple. JVD present.  Cardiovascular: Normal rate, regular rhythm and intact distal pulses.  Exam reveals no gallop and no friction rub.   No murmur heard. Pulmonary/Chest: No respiratory distress. He has no wheezes. He has rales (bases). He exhibits no tenderness.  Tachypnea, diminished globally  Abdominal: Soft. Bowel sounds are normal. He exhibits no distension. There is no tenderness. There is no rebound.  Musculoskeletal: Normal range of motion. He exhibits edema (1+ pitting edema bilaterally). He exhibits no tenderness.  Lymphadenopathy:    He has no cervical adenopathy.  Neurological: He is alert and oriented to person, place, and time.  Skin: Skin is warm. No rash noted.  Psychiatric: He has a normal mood and affect. His behavior is normal.  Nursing note and vitals reviewed.   ED Course  Procedures (including critical care time) Labs Review Labs Reviewed  CBC - Abnormal; Notable for the following:    RBC 3.45 (*)    MCV 121.7 (*)    MCH 39.4 (*)    Platelets 148 (*)    All other components within normal limits  PRO B NATRIURETIC PEPTIDE - Abnormal; Notable for the  following:    Pro B Natriuretic peptide (BNP) 2283.0 (*)    All other components within normal limits  COMPREHENSIVE METABOLIC PANEL - Abnormal; Notable for the following:    Glucose, Bld 167 (*)    BUN 27 (*)    Calcium 8.3 (*)    Albumin 3.4 (*)    Alkaline Phosphatase 133 (*)    GFR calc non Af Amer 57 (*)    GFR calc Af Amer 66 (*)    All other components within normal limits  PROTIME-INR - Abnormal; Notable for the following:    Prothrombin Time 20.4 (*)    INR 1.73 (*)    All other components within normal limits  I-STAT ARTERIAL BLOOD GAS, ED - Abnormal; Notable for the following:    pH, Arterial 7.347 (*)    pCO2 arterial 54.7 (*)  pO2, Arterial 116.0 (*)    Bicarbonate 30.0 (*)    Acid-Base Excess 3.0 (*)    All other components within normal limits  MRSA PCR SCREENING  APTT  Randolm Idol, ED    Imaging Review Dg Chest Port 1 View  06/04/2014   CLINICAL DATA:  68 year old male with acute shortness of breath, respiratory distress. Recently discharged from hospital. Initial encounter.  EXAM: PORTABLE CHEST - 1 VIEW  COMPARISON:  06/03/2014 and earlier.  FINDINGS: Portable AP upright view at 1730 hrs. Mildly regressed basilar predominant interstitial edema since yesterday. Stable cardiac size and mediastinal contours. No pneumothorax. No pleural effusion or consolidation.  IMPRESSION: Since yesterday regressed but not resolved basilar predominant interstitial opacity favored due to pulmonary edema. No new cardiopulmonary abnormality identified.   Electronically Signed   By: Lars Pinks M.D.   On: 06/04/2014 17:56   Dg Chest Port 1 View  06/03/2014   CLINICAL DATA:  68 year old male is with shortness of breath, found unresponsive in is a vehicle.  EXAM: PORTABLE CHEST - 1 VIEW  COMPARISON:  Prior chest x-ray 05/03/2014  FINDINGS: Interval enlargement of the cardiopericardial silhouette. Atherosclerotic calcification present in the transverse aorta. Increased pulmonary  vascular congestion now with mild interstitial edema. Small right pleural effusion. An implantable loop recorder projects over the left chest. No pneumothorax. No acute osseous abnormality. Osteoarthritis the right glenohumeral joint again noted.  IMPRESSION: 1. Interval enlargement of the cardiac silhouette accompanied by pulmonary edema consistent with acute mild-moderate CHF. 2. Small right pleural effusion.   Electronically Signed   By: Jacqulynn Cadet M.D.   On: 06/03/2014 07:17     EKG Interpretation None      MDM   Final diagnoses:  None    5:24 PM Pt is a 68 y.o. male with pertinent PMHX of afib on coumadin, polysubstance abuse, GERD, CHF, cardiomyopathy who presents to the ED with respiratory distress. Discharged from hospital today for CHF exacerbation and COPD. No illicit drug abuse. No fevers. No recent illness. Denies cough. No nausea, vomiting or diarrhea. No chest pain. No illicit drug abuse. Sudden onset of shortness of breath. Bystanders called EMS. Hypoxic to 55% Room air. Started on CPAP with improvement of sats to 94%. Given albuterol.  On arrival: tachypnea, hypertensive, recent admission for CHF exacerbation. Concern for acute CHF exacerbation. Plan to reduce overload with Nitro drip, continue BiPAP, obtain CBC, CMP, INR, istat troponin, BNP and CXR AP portable and EKG  EKG personally reviewed by myself showed NSR, prolonged QT, early repol Rate of 61, PR 153ms, QRS 68ms QT/QTC 502/560ms, normal axis, wwithout evidence of new ischemia. Comparison showed similar, indication: shortness of breath  CXR AP portable per my read showed vascular congestion and cardiomegaly  Review of labs: INR: 1.73 CMP: no electrolyte abnormalities no elevated LFTs BNP: 2283.0 CBc: no leukocytosis, H&H 13.6/42.0 istat troponin: 0.01 ABG: 7.347/54.7/116.0/30.0  BP improving with nitro drip. Stopped for BP of 947 systolic. Was able to wean off BiPAP to nasal canula. Given recent  admission, will consult Internal medicine teaching service for admission for CHF exacerbation.  Will hold off on nitro paste secondary to low pressure of 94 systolic.  Plan for admission to step down.  Labs, EKG and imaging reviewed by myself and considered in medical decision making if ordered.  Imaging interpreted by radiology. Pt was discussed with my attending, Dr. Walden Field, MD 06/05/14 [place], MD 06/05/14 [phone]

## 2014-06-04 NOTE — Plan of Care (Signed)
Problem: Consults Goal: Heart Failure Patient Education (See Patient Education module for education specifics.) Outcome: Completed/Met Date Met:  06/04/14 Goal: Skin Care Protocol Initiated - if Braden Score 18 or less If consults are not indicated, leave blank or document N/A Outcome: Completed/Met Date Met:  06/04/14 Goal: Tobacco Cessation referral if indicated Outcome: Progressing Goal: Nutrition Consult-if indicated Outcome: Completed/Met Date Met:  06/04/14 Goal: Diabetes Guidelines if Diabetic/Glucose > 140 If diabetic or lab glucose is > 140 mg/dl - Initiate Diabetes/Hyperglycemia Guidelines & Document Interventions  Outcome: Not Applicable Date Met:  34/19/37  Problem: Phase I Progression Outcomes Goal: Dyspnea controlled at rest (HF) Outcome: Completed/Met Date Met:  06/04/14 Goal: Pain controlled with appropriate interventions Outcome: Completed/Met Date Met:  06/04/14

## 2014-06-04 NOTE — Progress Notes (Signed)
Subjective: This AM, he reports no complaints. He feels better and does not report drinking any antifreeze or taking any of his medications any differently.   Net output has been -1.5L since admission.   Objective: Vital signs in last 24 hours: Filed Vitals:   06/03/14 1329 06/03/14 2057 06/04/14 0522 06/04/14 0832  BP: 116/70 102/62 150/72   Pulse: 61 47 59   Temp: 97.5 F (36.4 C) 99 F (37.2 C) 98 F (36.7 C)   TempSrc: Oral Oral    Resp:  16 18   Height: 5\' 6"  (1.676 m)     Weight: 191 lb 12.8 oz (87 kg)  189 lb 13.1 oz (86.1 kg)   SpO2: 98% 94% 100% 98%   Weight change:   Intake/Output Summary (Last 24 hours) at 06/04/14 1058 Last data filed at 06/04/14 0500  Gross per 24 hour  Intake    780 ml  Output   2325 ml  Net  -1545 ml   General: resting in bed, NAD HEENT: PERRL, EOMI, no scleral icterus Cardiac: RRR, no rubs, murmurs or gallops Pulm: end-expiratory wheezing  Abd: soft, nontender, nondistended, BS present Ext: warm and well perfused, no pedal edema Neuro: responds to questions appropriately; moving all extremities freely    Lab Results: Basic Metabolic Panel:  Recent Labs Lab 06/03/14 0623 06/04/14 0127  NA 141 142  K 4.5 3.9  CL 102 102  CO2 19 29  GLUCOSE 240* 90  BUN 22 28*  CREATININE 1.15 1.18  CALCIUM 8.5 8.3*   Liver Function Tests:  Recent Labs Lab 06/03/14 0623  AST 40*  ALT 27  ALKPHOS 128*  BILITOT 0.5  PROT 7.2  ALBUMIN 3.3*    Recent Labs Lab 06/03/14 0623  LIPASE 76*   No results for input(s): AMMONIA in the last 168 hours. CBC:  Recent Labs Lab 06/03/14 0622  WBC 6.9  NEUTROABS 3.4  HGB 13.8  HCT 43.6  MCV 127.9*  PLT 161   Cardiac Enzymes:  Recent Labs Lab 06/03/14 1348 06/03/14 1939 06/04/14 0127  CKTOTAL 92  --   --   TROPONINI <0.30 <0.30 <0.30   BNP:  Recent Labs Lab 06/03/14 0622  PROBNP 3336.0*   Hemoglobin A1C:  Recent Labs Lab 06/03/14 1348  HGBA1C 5.1    Coagulation:  Recent Labs Lab 06/03/14 0623  LABPROT 23.8*  INR 2.11*   Urine Drug Screen: Drugs of Abuse     Component Value Date/Time   LABOPIA NONE DETECTED 06/03/2014 1155   LABOPIA NEGATIVE 04/29/2014 0933   COCAINSCRNUR NONE DETECTED 06/03/2014 1155   COCAINSCRNUR NEGATIVE 04/29/2014 0933   LABBENZ NONE DETECTED 06/03/2014 1155   LABBENZ NEGATIVE 04/29/2014 0933   AMPHETMU NONE DETECTED 06/03/2014 1155   AMPHETMU NEGATIVE 04/29/2014 0933   THCU NONE DETECTED 06/03/2014 1155   LABBARB NONE DETECTED 06/03/2014 1155    Alcohol Level:  Recent Labs Lab 06/03/14 0623  ETH <11   Urinalysis:  Recent Labs Lab 06/03/14 1156  COLORURINE YELLOW  LABSPEC 1.026  PHURINE 5.0  GLUCOSEU NEGATIVE  HGBUR MODERATE*  BILIRUBINUR NEGATIVE  KETONESUR 15*  PROTEINUR NEGATIVE  UROBILINOGEN 0.2  NITRITE NEGATIVE  LEUKOCYTESUR NEGATIVE     Micro Results: Recent Results (from the past 240 hour(s))  Blood culture (routine x 2)     Status: None (Preliminary result)   Collection Time: 06/03/14  8:02 AM  Result Value Ref Range Status   Specimen Description BLOOD LEFT ARM  Final   Special  Requests BOTTLES DRAWN AEROBIC AND ANAEROBIC 10CC  Final   Culture  Setup Time   Final    06/03/2014 15:35 Performed at [facility]    Culture   Final           BLOOD CULTURE RECEIVED NO GROWTH TO DATE CULTURE WILL BE HELD FOR 5 DAYS BEFORE ISSUING A FINAL NEGATIVE REPORT Performed at [facility]    Report Status PENDING  Incomplete  Blood culture (routine x 2)     Status: None (Preliminary result)   Collection Time: 06/03/14  8:10 AM  Result Value Ref Range Status   Specimen Description BLOOD RIGHT HAND  Final   Special Requests BOTTLES DRAWN AEROBIC ONLY 8CC  Final   Culture  Setup Time   Final    06/03/2014 15:37 Performed at [facility]    Culture   Final           BLOOD CULTURE RECEIVED NO GROWTH TO DATE CULTURE WILL BE HELD FOR 5 DAYS BEFORE  ISSUING A FINAL NEGATIVE REPORT Performed at [facility]    Report Status PENDING  Incomplete  MRSA PCR Screening     Status: None   Collection Time: 06/03/14  4:37 PM  Result Value Ref Range Status   MRSA by PCR NEGATIVE NEGATIVE Final    Comment:        The GeneXpert MRSA Assay (FDA approved for NASAL specimens only), is one component of a comprehensive MRSA colonization surveillance program. It is not intended to diagnose MRSA infection nor to guide or monitor treatment for MRSA infections.    Studies/Results: Dg Chest Port 1 View  06/03/2014   CLINICAL DATA:  68 year old male is with shortness of breath, found unresponsive in is a vehicle.  EXAM: PORTABLE CHEST - 1 VIEW  COMPARISON:  Prior chest x-ray 05/03/2014  FINDINGS: Interval enlargement of the cardiopericardial silhouette. Atherosclerotic calcification present in the transverse aorta. Increased pulmonary vascular congestion now with mild interstitial edema. Small right pleural effusion. An implantable loop recorder projects over the left chest. No pneumothorax. No acute osseous abnormality. Osteoarthritis the right glenohumeral joint again noted.  IMPRESSION: 1. Interval enlargement of the cardiac silhouette accompanied by pulmonary edema consistent with acute mild-moderate CHF. 2. Small right pleural effusion.   Electronically Signed   By: Jacqulynn Cadet M.D.   On: 06/03/2014 07:17   Medications: I have reviewed the patient's current medications. Scheduled Meds: . amiodarone  200 mg Oral Daily  . carvedilol  12.5 mg Oral BID WC  . enoxaparin (LOVENOX) injection  40 mg Subcutaneous Q24H  . fluticasone  2 puff Inhalation BID  . hydroxyurea  1,500 mg Oral Daily  . methadone  10 mg Oral Daily  . tiotropium  18 mcg Inhalation Daily   Continuous Infusions:  PRN Meds:.ipratropium-albuterol Assessment/Plan:  [name] is a 68 year old male with a-fib on amiodarone & warfarin (INR 3 on 11/9), h/o  polysubstance abuse (EtOH, heroin, tobacco), HTN, CHF (EF 41-93%, grade 1 diastolic dysfunction), polycythemia, COPD hospitalized for altered mental status.  Altered mental status: Less likely metabolicgiven that his electrolytes appeared unremarkable; initial anion gap of 20 may have been erroneous. Cardiac causes less likely given troponins negative x 3. He appears at baseline on interview today and is otherwise stable for discharge. -Interrogate loop recorder  CHF: Net output 1.5L. Physical exam findings reassuring for no hypervolemia.  COPD: Mild wheezing on exam.  -Continue Spiriva, Flovent, Duonebs q6h prn.  Polysubstance  abuse: Continue home methadone dose of 10mg .   A-fib: Continue amiodarone & carvedilol.  Polycythemia: Continue hydroxyurea 1500mg  QD  HTN: Continue carvedilol.  #FEN:  -Diet: Heart Healthy  #DVT prophylaxis: Lovenox  #CODE STATUS: FULL CODE   Dispo: Disposition is deferred at this time, awaiting improvement of current medical problems.    The patient does have a current PCP Cresenciano Genre, MD) and does need an [facility] hospital follow-up appointment after discharge.  The patient does not have transportation limitations that hinder transportation to clinic appointments.  .Services Needed at time of discharge: Y = Yes, Blank = No PT:   OT:   RN:   Equipment:   Other:     LOS: 1 day   Charlott Rakes, MD 06/04/2014, 10:58 AM

## 2014-06-04 NOTE — ED Notes (Signed)
Discharged from Hospital today, went home and was walking down the street and began to feel SOB, bystander called 911, pt initially at 55% on RA, pale, Diaphoretic, placed on CPAP by EMS and sats increased to 94$ given Allbuterol 10 mg, and Atrovent 0.5mg . crackels at bases, diminished bilaterally. Dr. Reather Converse and Dr. Jacelyn Grip at Bedside

## 2014-06-04 NOTE — Progress Notes (Signed)
Pt seen and examined with Dr. Posey Pronto.  In brief, patient is a 68 y/o male with PMH of afib on a/c, HTN, chronic diastolic HF, polysubstance abuse p/w MAS * 1 episode. Pt states that he went to warm his car up and remembers turning on the ignition but does not remember much after that. His wife called EMS and they found him diaphoretic and SOB in his car and he was brought to ED for further eval. He denies illicit drug use or drinking anti freeze. In ED he was given narcan which improved his breathing. Currently pt feels well and is AAO*3. Remaining ROS negative  Physical Exam: CVS: RRR, normal heart sounds Pulm: CTA b/l Abd: soft, non tender, BS + Ext: no pedal edema Gen: AAO*3, NAD  Assessment and Plan: 68 y/o male with hypoxia, AMS which have now resolved  AMS: - Unknown etiology. No evidence of seizure activity and pt with normal CK - Pt did have an initial AG of 20 with a negative utox but AG now normalized. There was a possibility of anti freeze ingestion but patient denies this - Loop recorder interrogated and pt with 1 episode of afib in November lasting approx 6 minutes but unlikely etiology of AMS - Pt now at baseline. No further w/u for now   Afib: - c/w amiodarone and coreg. Dose warfarin today

## 2014-06-04 NOTE — Discharge Instructions (Addendum)
Thank you for trusting [place] with your medical care!  You were hospitalized for acute CHF exacerbation and treated with Lasix.   Please follow-up with the Internal Medicine Clinic on

## 2014-06-05 ENCOUNTER — Encounter (HOSPITAL_COMMUNITY): Payer: Self-pay | Admitting: Radiology

## 2014-06-05 ENCOUNTER — Observation Stay (HOSPITAL_COMMUNITY): Payer: Medicare Other

## 2014-06-05 DIAGNOSIS — J810 Acute pulmonary edema: Secondary | ICD-10-CM | POA: Diagnosis present

## 2014-06-05 DIAGNOSIS — R9431 Abnormal electrocardiogram [ECG] [EKG]: Secondary | ICD-10-CM | POA: Diagnosis present

## 2014-06-05 DIAGNOSIS — I509 Heart failure, unspecified: Secondary | ICD-10-CM

## 2014-06-05 DIAGNOSIS — I4891 Unspecified atrial fibrillation: Secondary | ICD-10-CM

## 2014-06-05 DIAGNOSIS — R4182 Altered mental status, unspecified: Secondary | ICD-10-CM

## 2014-06-05 DIAGNOSIS — F1120 Opioid dependence, uncomplicated: Secondary | ICD-10-CM | POA: Diagnosis not present

## 2014-06-05 DIAGNOSIS — I161 Hypertensive emergency: Secondary | ICD-10-CM | POA: Diagnosis present

## 2014-06-05 DIAGNOSIS — R0603 Acute respiratory distress: Secondary | ICD-10-CM | POA: Diagnosis present

## 2014-06-05 DIAGNOSIS — I5031 Acute diastolic (congestive) heart failure: Secondary | ICD-10-CM | POA: Diagnosis present

## 2014-06-05 DIAGNOSIS — I10 Essential (primary) hypertension: Secondary | ICD-10-CM

## 2014-06-05 DIAGNOSIS — R001 Bradycardia, unspecified: Secondary | ICD-10-CM | POA: Diagnosis present

## 2014-06-05 DIAGNOSIS — J449 Chronic obstructive pulmonary disease, unspecified: Secondary | ICD-10-CM

## 2014-06-05 DIAGNOSIS — D751 Secondary polycythemia: Secondary | ICD-10-CM

## 2014-06-05 DIAGNOSIS — F1910 Other psychoactive substance abuse, uncomplicated: Secondary | ICD-10-CM

## 2014-06-05 LAB — URINALYSIS, ROUTINE W REFLEX MICROSCOPIC
BILIRUBIN URINE: NEGATIVE
GLUCOSE, UA: NEGATIVE mg/dL
KETONES UR: NEGATIVE mg/dL
Leukocytes, UA: NEGATIVE
Nitrite: NEGATIVE
PH: 6 (ref 5.0–8.0)
Protein, ur: NEGATIVE mg/dL
Specific Gravity, Urine: 1.007 (ref 1.005–1.030)
Urobilinogen, UA: 0.2 mg/dL (ref 0.0–1.0)

## 2014-06-05 LAB — CBC WITH DIFFERENTIAL/PLATELET
BASOS ABS: 0 10*3/uL (ref 0.0–0.1)
Basophils Relative: 0 % (ref 0–1)
Eosinophils Absolute: 0.1 10*3/uL (ref 0.0–0.7)
Eosinophils Relative: 2 % (ref 0–5)
HCT: 38.1 % — ABNORMAL LOW (ref 39.0–52.0)
Hemoglobin: 12.5 g/dL — ABNORMAL LOW (ref 13.0–17.0)
LYMPHS PCT: 19 % (ref 12–46)
Lymphs Abs: 0.9 10*3/uL (ref 0.7–4.0)
MCH: 39.6 pg — ABNORMAL HIGH (ref 26.0–34.0)
MCHC: 32.8 g/dL (ref 30.0–36.0)
MCV: 120.6 fL — ABNORMAL HIGH (ref 78.0–100.0)
MONOS PCT: 10 % (ref 3–12)
Monocytes Absolute: 0.5 10*3/uL (ref 0.1–1.0)
Neutro Abs: 3.1 10*3/uL (ref 1.7–7.7)
Neutrophils Relative %: 69 % (ref 43–77)
Platelets: 123 10*3/uL — ABNORMAL LOW (ref 150–400)
RBC: 3.16 MIL/uL — ABNORMAL LOW (ref 4.22–5.81)
RDW: 13.5 % (ref 11.5–15.5)
WBC: 4.6 10*3/uL (ref 4.0–10.5)

## 2014-06-05 LAB — TSH: TSH: 1.03 u[IU]/mL (ref 0.350–4.500)

## 2014-06-05 LAB — BASIC METABOLIC PANEL
ANION GAP: 11 (ref 5–15)
BUN: 21 mg/dL (ref 6–23)
CO2: 33 mEq/L — ABNORMAL HIGH (ref 19–32)
Calcium: 8.8 mg/dL (ref 8.4–10.5)
Chloride: 99 mEq/L (ref 96–112)
Creatinine, Ser: 1.13 mg/dL (ref 0.50–1.35)
GFR calc Af Amer: 76 mL/min — ABNORMAL LOW (ref >90)
GFR, EST NON AFRICAN AMERICAN: 65 mL/min — AB (ref >90)
GLUCOSE: 85 mg/dL (ref 70–99)
POTASSIUM: 3.9 meq/L (ref 3.7–5.3)
Sodium: 143 mEq/L (ref 137–147)

## 2014-06-05 LAB — TROPONIN I: Troponin I: <0.3 ng/mL (ref <0.30)

## 2014-06-05 LAB — RAPID URINE DRUG SCREEN, HOSP PERFORMED
Amphetamines: NOT DETECTED
BARBITURATES: NOT DETECTED
Benzodiazepines: NOT DETECTED
Cocaine: NOT DETECTED
Opiates: NOT DETECTED
Tetrahydrocannabinol: NOT DETECTED

## 2014-06-05 LAB — PROTIME-INR
INR: 1.57 — ABNORMAL HIGH (ref 0.00–1.49)
PROTHROMBIN TIME: 19 s — AB (ref 11.6–15.2)

## 2014-06-05 LAB — URINE MICROSCOPIC-ADD ON

## 2014-06-05 LAB — NA AND K (SODIUM & POTASSIUM), RAND UR
POTASSIUM UR: 12 meq/L
Sodium, Ur: 84 mEq/L

## 2014-06-05 LAB — MAGNESIUM: MAGNESIUM: 1.8 mg/dL (ref 1.5–2.5)

## 2014-06-05 LAB — ETHANOL

## 2014-06-05 LAB — LACTIC ACID, PLASMA: Lactic Acid, Venous: 0.9 mmol/L (ref 0.5–2.2)

## 2014-06-05 MED ORDER — TIOTROPIUM BROMIDE MONOHYDRATE 18 MCG IN CAPS
18.0000 ug | ORAL_CAPSULE | Freq: Every day | RESPIRATORY_TRACT | Status: DC
  Administered 2014-06-05 – 2014-06-08 (×4): 18 ug via RESPIRATORY_TRACT
  Filled 2014-06-05: qty 5

## 2014-06-05 MED ORDER — ENOXAPARIN SODIUM 40 MG/0.4ML ~~LOC~~ SOLN
40.0000 mg | SUBCUTANEOUS | Status: DC
  Administered 2014-06-05 – 2014-06-07 (×3): 40 mg via SUBCUTANEOUS
  Filled 2014-06-05 (×4): qty 0.4

## 2014-06-05 MED ORDER — AMIODARONE HCL 200 MG PO TABS
200.0000 mg | ORAL_TABLET | Freq: Every day | ORAL | Status: DC
  Administered 2014-06-05 – 2014-06-08 (×4): 200 mg via ORAL
  Filled 2014-06-05 (×4): qty 1

## 2014-06-05 MED ORDER — WARFARIN SODIUM 7.5 MG PO TABS
7.5000 mg | ORAL_TABLET | Freq: Once | ORAL | Status: DC
  Filled 2014-06-05: qty 1

## 2014-06-05 MED ORDER — WARFARIN SODIUM 5 MG PO TABS
5.0000 mg | ORAL_TABLET | Freq: Once | ORAL | Status: AC
  Administered 2014-06-05: 5 mg via ORAL
  Filled 2014-06-05: qty 1

## 2014-06-05 MED ORDER — ALBUTEROL SULFATE (2.5 MG/3ML) 0.083% IN NEBU: 2.5000 mg | INHALATION_SOLUTION | Freq: Four times a day (QID) | RESPIRATORY_TRACT | Status: DC | PRN

## 2014-06-05 MED ORDER — HYDROXYUREA 500 MG PO CAPS
1500.0000 mg | ORAL_CAPSULE | Freq: Every day | ORAL | Status: DC
  Administered 2014-06-05 – 2014-06-08 (×4): 1500 mg via ORAL
  Filled 2014-06-05 (×4): qty 3

## 2014-06-05 MED ORDER — WARFARIN - PHARMACIST DOSING INPATIENT
Freq: Every day | Status: DC
  Administered 2014-06-06 – 2014-06-07 (×2)

## 2014-06-05 MED ORDER — IOHEXOL 350 MG/ML SOLN
100.0000 mL | Freq: Once | INTRAVENOUS | Status: AC | PRN
  Administered 2014-06-05: 100 mL via INTRAVENOUS

## 2014-06-05 MED ORDER — FLUTICASONE PROPIONATE HFA 44 MCG/ACT IN AERO
1.0000 | INHALATION_SPRAY | Freq: Two times a day (BID) | RESPIRATORY_TRACT | Status: DC
  Administered 2014-06-05 – 2014-06-08 (×7): 1 via RESPIRATORY_TRACT
  Filled 2014-06-05 (×2): qty 10.6

## 2014-06-05 MED ORDER — SODIUM CHLORIDE 0.9 % IJ SOLN
3.0000 mL | Freq: Two times a day (BID) | INTRAMUSCULAR | Status: DC
  Administered 2014-06-05 – 2014-06-08 (×5): 3 mL via INTRAVENOUS

## 2014-06-05 MED ORDER — METHADONE HCL 10 MG PO TABS
26.0000 mg | ORAL_TABLET | Freq: Every day | ORAL | Status: DC
  Administered 2014-06-05 – 2014-06-08 (×3): 25 mg via ORAL
  Filled 2014-06-05: qty 2
  Filled 2014-06-05: qty 1
  Filled 2014-06-05: qty 3
  Filled 2014-06-05: qty 1
  Filled 2014-06-05: qty 2
  Filled 2014-06-05: qty 1
  Filled 2014-06-05: qty 2

## 2014-06-05 MED ORDER — CARVEDILOL 12.5 MG PO TABS
12.5000 mg | ORAL_TABLET | Freq: Two times a day (BID) | ORAL | Status: DC
  Administered 2014-06-05 – 2014-06-08 (×6): 12.5 mg via ORAL
  Filled 2014-06-05 (×9): qty 1

## 2014-06-05 MED ORDER — ALBUTEROL SULFATE HFA 108 (90 BASE) MCG/ACT IN AERS: 1.0000 | INHALATION_SPRAY | Freq: Every day | RESPIRATORY_TRACT | Status: DC | PRN

## 2014-06-05 NOTE — H&P (Signed)
Date: 06/05/2014               Patient Name:  [name] MRN: [id number]  DOB: 1945-10-08 Age / Sex: 68 y.o., male   PCP: Cresenciano Genre, MD         Medical Service: Internal Medicine Teaching Service         Attending Physician: Dr. Aldine Contes, MD    First Contact: Dr. Charlott Rakes Pager: [phone]  Second Contact: Dr. Bing Neighbors Pager: [phone]       After Hours (After 5p/  First Contact Pager: [phone]  weekends / holidays): Second Contact Pager: [phone]   Chief Complaint: shortness of breath this afternoon  History of Present Illness: [name] is a 68 yo man with a history of HFpEF (grade 1, EF 50-55%), hypertension, atrial fibrillation (on amiodarone and coumadin), polycythemia and polysubstance abuse (heroin, alcohol and tobacco) who presented this afternoon with shortness of breath. He had just been hospitalized overnight at [facility], and rode home on a bus from the hospital. Soon after he got off the bus, while walking the 2-blocks to his house, he suddenly felt short of breath and sweaty. An observer asked if he needed help and then asked his address; he recalls being confused and unable to state where he lived. He believes he lost consciousness for several seconds.   When EMS arrived, he was reported to have an O2 saturation of 55% (per ED MD, though EMS records did not actually record this) and a BP of 209/129. He denies any chest pain, headache, bleeding, calf pain, congestion, cough or other symptoms.   Importantly, the had been discharged earlier today from [facility] after a one-day hospitalization (11/15-11/16). He had presented at that time similarly, with sudden shortness of breath and AMS.   Patient was given albuterol, atrovent and lasix along with a nitro drip and cpap-->bipap in the ED.  Of note, the patient's last echo showed grade 1 diastolic dysfunction and an EF of 55% in 04/2014; he previously had systolic dysfunction with  an EF of <35% in 11/14. He last had PFTs on 11/11: FEV1/FVC 0.58, FEV1 45% of predicted. He smokes cigarettes (100 pack year history, but now only smokes 4-5 cigarettes per day) and reports last heroin use 5-6 months ago.   Meds: Current Facility-Administered Medications  Medication Dose Route Frequency Provider Last Rate Last Dose  . antiseptic oral rinse (CPC / CETYLPYRIDINIUM CHLORIDE 0.05%) solution 7 mL  7 mL Mouth Rinse BID Nischal Narendra, MD   7 mL at 06/04/14 2215  . nitroGLYCERIN (NITROSTAT) 0.4 MG SL tablet           . nitroGLYCERIN 0.2 mg/mL in dextrose 5 % infusion             Allergies: Allergies as of 06/04/2014 - Review Complete 06/04/2014  Allergen Reaction Noted  . Codeine Rash 09/30/2010   Past Medical History  Diagnosis Date  . Cardiomyopathy     EF55% 11/14<<35%   . Substance abuse     alcohol  . Hypertension   . Atrial fibrillation /flutter     hx of flutter ablation 2/12  . Shortness of breath     "all the time lately" (12/14/2012)  . GERD (gastroesophageal reflux disease)   . Hepatitis     "think I had that once; a long time ago; from dirty needles I think" (12/14/2012)  . 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 Leukocytosis, unspecified 06/12/2013  . Myeloproliferative  neoplasm 08/15/2013  . Obesity   . Syncope   . Syncope    Past Surgical History  Procedure Laterality Date  . Hemorrhoid surgery  1970's  . Cardiac electrophysiology mapping and ablation  08/2010    [name] 09/07/2010 (12/14/2012)  . Loop recorder implant  08-22-2013    MDT LinQ implanted by Dr Caryl Comes for syncope   Family History  Problem Relation Age of Onset  . Diabetes Mother   . Hypertension Mother   . Cirrhosis Father   . Alcohol abuse Father    History   Social History  . Marital Status: Single    Spouse Name: N/A    Number of Children: N/A  . Years of Education: N/A   Occupational History  . Not on file.   Social History Main Topics  . Smoking status: Current Every Day  Smoker -- 0.20 packs/day for 40 years    Types: Cigarettes  . Smokeless tobacco: Never Used     Comment: patient smoked 1-2ppd x 50 years, now smoking approx 4-5 cigs per day x 1 year  . Alcohol Use: 0.0 oz/week    0 Not specified per week     Comment: drinks a beer once in a while for the past year, but has h/o heavy alcohol use for many years before this  . Drug Use: Yes    Special: "Crack" cocaine, Cocaine, Heroin     Comment: 03/20/2013 "been clean for awhile"; endorses using heroin "all my life" up until about a year ago  . Sexual Activity: Not Currently   Other Topics Concern  . Not on file   Social History Narrative   Moved here from [place]. Lives with common law wife and grandchildren. He is retired from "general labor."    Review of Systems: Pertinent items are noted in HPI.  Physical Exam: Blood pressure 100/57, pulse 49, temperature 98.8 F (37.1 C), temperature source Oral, resp. rate 14, height 5\' 6"  (1.676 m), weight 191 lb 9.3 oz (86.9 kg), SpO2 97 %. Appearance: in NAD, lying in bed watching TV with 2L [place] in place  HEENT: AT/[place], PERRL, EOMi, no scleral icterus, no bleeding from nose Heart: RRR, normal S1S2 Lungs: crackles at bases b/l, otherwise CTAB Abdomen: BS+, soft, nontender Extremities: 2+ pitting edema to thighs b/l Neurologic: A&Ox3, CN II-XII intact Skin: WWP   Lab results: Basic Metabolic Panel:  Recent Labs  06/04/14 0127 06/04/14 1714  NA 142 138  K 3.9 4.3  CL 102 99  CO2 29 24  GLUCOSE 90 167*  BUN 28* 27*  CREATININE 1.18 1.27  CALCIUM 8.3* 8.3*   Liver Function Tests:  Recent Labs  06/03/14 0623 06/04/14 1714  AST 40* 36  ALT 27 28  ALKPHOS 128* 133*  BILITOT 0.5 0.6  PROT 7.2 7.1  ALBUMIN 3.3* 3.4*    Recent Labs  06/03/14 0623  LIPASE 76*   No results for input(s): AMMONIA in the last 72 hours. CBC:  Recent Labs  06/03/14 0622 06/04/14 1714  WBC 6.9 6.6  NEUTROABS 3.4  --   HGB 13.8 13.6  HCT 43.6 42.0    MCV 127.9* 121.7*  PLT 161 148*   Cardiac Enzymes:  Recent Labs  06/03/14 1348 06/03/14 1939 06/04/14 0127  CKTOTAL 92  --   --   TROPONINI <0.30 <0.30 <0.30   BNP:  Recent Labs  06/03/14 0622 06/04/14 1714  PROBNP 3336.0* 2283.0*   Hemoglobin A1C:  Recent Labs  06/03/14 1348  HGBA1C 5.1   Coagulation:  Recent Labs  06/04/14 1315 06/04/14 1714  LABPROT 21.8* 20.4*  INR 1.88* 1.73*   Urine Drug Screen: Drugs of Abuse     Component Value Date/Time   LABOPIA NONE DETECTED 06/03/2014 1155   LABOPIA NEGATIVE 04/29/2014 0933   COCAINSCRNUR NONE DETECTED 06/03/2014 1155   COCAINSCRNUR NEGATIVE 04/29/2014 0933   LABBENZ NONE DETECTED 06/03/2014 1155   LABBENZ NEGATIVE 04/29/2014 0933   AMPHETMU NONE DETECTED 06/03/2014 1155   AMPHETMU NEGATIVE 04/29/2014 0933   THCU NONE DETECTED 06/03/2014 1155   LABBARB NONE DETECTED 06/03/2014 1155    Alcohol Level:  Recent Labs  06/03/14 0623  ETH <11   Urinalysis:  Recent Labs  06/03/14 1156  COLORURINE YELLOW  LABSPEC 1.026  PHURINE 5.0  GLUCOSEU NEGATIVE  HGBUR MODERATE*  BILIRUBINUR NEGATIVE  KETONESUR 15*  PROTEINUR NEGATIVE  UROBILINOGEN 0.2  NITRITE NEGATIVE  LEUKOCYTESUR NEGATIVE   Imaging results:  Dg Chest Port 1 View  06/04/2014   CLINICAL DATA:  68 year old male with acute shortness of breath, respiratory distress. Recently discharged from hospital. Initial encounter.  EXAM: PORTABLE CHEST - 1 VIEW  COMPARISON:  06/03/2014 and earlier.  FINDINGS: Portable AP upright view at 1730 hrs. Mildly regressed basilar predominant interstitial edema since yesterday. Stable cardiac size and mediastinal contours. No pneumothorax. No pleural effusion or consolidation.  IMPRESSION: Since yesterday regressed but not resolved basilar predominant interstitial opacity favored due to pulmonary edema. No new cardiopulmonary abnormality identified.   Electronically Signed   By: Lars Pinks M.D.   On: 06/04/2014 17:56    Dg Chest Port 1 View  06/03/2014   CLINICAL DATA:  68 year old male is with shortness of breath, found unresponsive in is a vehicle.  EXAM: PORTABLE CHEST - 1 VIEW  COMPARISON:  Prior chest x-ray 05/03/2014  FINDINGS: Interval enlargement of the cardiopericardial silhouette. Atherosclerotic calcification present in the transverse aorta. Increased pulmonary vascular congestion now with mild interstitial edema. Small right pleural effusion. An implantable loop recorder projects over the left chest. No pneumothorax. No acute osseous abnormality. Osteoarthritis the right glenohumeral joint again noted.  IMPRESSION: 1. Interval enlargement of the cardiac silhouette accompanied by pulmonary edema consistent with acute mild-moderate CHF. 2. Small right pleural effusion.   Electronically Signed   By: Jacqulynn Cadet M.D.   On: 06/03/2014 07:17    Other results: EKG: rate 61, NSR, prolonged QT (502), early repol, normal axis, no new ischemia. Similar to previous.  Assessment & Plan by Problem: Active Problems:   CHF exacerbation   Acute respiratory distress  In summary, Mr. Delsin Copen is a 68 yo man here for evaluation of acute respiratory distress, hypoxia, hypertension and sweating directly following his discharge from hospitalization for CHF exacerbation earlier today. Patient improved markedly on cpap then bipap then 2 L [place]. He is now breathing comfortably, satting 100% on room air. The acute respiratory failure was most likely caused by flash pulmonary edema secondary to hypertensive emergency. Other possible causes of the ARF include acute diastolic CHF exacerbation in its own right, or methadone / heroin overdose (both of which can cause pulmonary edema); however, the patient denies any ingestions.  Acute Respiratory Failure: Patients with HFpEF can experience flash pulmonary edema secondary to hypertension, which is likely what happened in this patient. Patient was found to be hypertensive  to 209/129 in the field with possible O2 sat of 55%? and shortness of breath with B-lines on bedside US, all consistent with flash pulmonary edema.  Recently hospitalized with similar presentation of shortness of breath and AMS. Patient was given albuterol, atrovent and lasix along with a nitro drip. - 2-view CXR tomorrow - Trend troponins x 3 - Carvedilol per home medications - Nitro now off  Hypertensive Emergency: BP responded to nitro (in fact, went too low to SBP in 90s) in ED, so nitro has been stopped. Unknown cause. - F/u UDS - Continue home anti-hypertensive (carvedilol)  Respiratory Acidosis with Anion Gap: AG of 15. pH 7.347, pCO2 54.7, pO2 116, bicarb 30.0. Denies ingesting anything since leaving the hospital, no suggestion of starvation ketoacidosis. Likely a lactic acidosis due to hypoxemia. - Lactic acid pending (may have normalized) - Ethanol pending  HFpEF (Grade 1) with possible acute exacerbation: Vascular congestion on CXR, weight currently 191 (up from baseline 180 lbs), BNP elevated to 2000. Though this elevated BNP is likely due to the extreme hypertension, patient's exam (2+ pitting edema in his BLE and crackles at his lung bases b/l) consistent with volume overload. Lasix 40 mg IV was given.  - Consider continuing lasix IV tomorrow - Strict I/O and daily weights  Syncope vs Acute Encephalopathy: Likely secondary to hypoxia. Now at baseline. Similar account of AMS to last presentation. - Continue to monitor for neuro status  History of Atrial Fibrillation: On Coumadin, currently subtherapeutic at 1.88. In NSR. - Continue amiodarone - Continue coumadin per pharmacy with lovenox bridge until therapeutic  Prolonged QT: 506 - Avoid QT-prolonging medications - Repeat EKG tomorrow am  Sinus Bradycardia: this is at his baseline - Monitor on tele  COPD:  - Continue home albuterol, spiriva and flovent (replacing home pulmicort)   Polysubstance Abuse: Previously abused  heroin; currently on methadone therapy. Prior methadone dose was 30 mg, but was recently tapered to 10 mg. Patient denies taking any extra methadone or ingesting any other substances after discharge today. - UDS pending (was negative yesterday, on 11/15) - Confirm ADS on [place] to confirm dose (more than 10 mg in Epic, discharged on 10 mg)  History of Polycythemia: hematocrit currently WNL at 42%, treated with hydroxyurea. Positive for JAK-2 mutation.  - Continue hydroxyurea  Diet:  - HH  DVT Ppx: - On warfarin and lovenox until therapeutic  Dispo: Disposition is deferred at this time, awaiting improvement of current medical problems. Anticipated discharge in approximately 2 day(s).   The patient does have a current PCP Cresenciano Genre, MD) and does need an [facility] hospital follow-up appointment after discharge.  The patient does have transportation limitations that hinder transportation to clinic appointments.  Signed: Drucilla Schmidt, MD 06/05/2014, 12:39 AM

## 2014-06-05 NOTE — Progress Notes (Signed)
UR completed 

## 2014-06-05 NOTE — Progress Notes (Signed)
Subjective: This AM, he appears back to baseline. He is not in any respiratory distress and has no complaints. He reports having similar complaints over the past several years.   Per chart review, he was found to have a R thyroid nodule back in February during one of his hospitalizations for a similar presentation. At follow-up visit, TSH 0.166, fT4 1.54; thyroid US was never done.   Records from his methadone clinic indicate that he was being titrated back to 30mg  with last titration 23->26mg  on 11/14 (Saturday). His UDS from 10/1 and 10/16 were remarkable for methadone only.  Objective: Vital signs in last 24 hours: Filed Vitals:   06/05/14 0745 06/05/14 0834 06/05/14 1004 06/05/14 1122  BP: 138/52 160/48  156/67  Pulse: 58 70  45  Temp: 97.5 F (36.4 C)   97.2 F (36.2 C)  TempSrc: Oral   Oral  Resp: 17   11  Height:      Weight:      SpO2: 94%  100% 97%   Weight change:   Intake/Output Summary (Last 24 hours) at 06/05/14 1125 Last data filed at 06/05/14 0730  Gross per 24 hour  Intake    446 ml  Output   2400 ml  Net  -1954 ml   General: resting in bed HEENT: PERRL, EOMI, no scleral icterus Cardiac: RRR, no rubs, murmurs or gallops Pulm: mild end-expiratory wheezing  Abd: soft, nontender, nondistended, BS present Ext: warm and well perfused, no pedal edema Neuro: responds to questions appropriately; moving all extremities freely    Lab Results: Basic Metabolic Panel:  Recent Labs Lab 06/04/14 1714 06/05/14 0805  NA 138 143  K 4.3 3.9  CL 99 99  CO2 24 33*  GLUCOSE 167* 85  BUN 27* 21  CREATININE 1.27 1.13  CALCIUM 8.3* 8.8  MG  --  1.8   Liver Function Tests:  Recent Labs Lab 06/03/14 0623 06/04/14 1714  AST 40* 36  ALT 27 28  ALKPHOS 128* 133*  BILITOT 0.5 0.6  PROT 7.2 7.1  ALBUMIN 3.3* 3.4*    Recent Labs Lab 06/03/14 0623  LIPASE 76*   No results for input(s): AMMONIA in the last 168 hours. CBC:  Recent Labs Lab  06/03/14 0622 06/04/14 1714 06/05/14 0805  WBC 6.9 6.6 4.6  NEUTROABS 3.4  --  3.1  HGB 13.8 13.6 12.5*  HCT 43.6 42.0 38.1*  MCV 127.9* 121.7* 120.6*  PLT 161 148* 123*   Cardiac Enzymes:  Recent Labs Lab 06/03/14 1348  06/04/14 0127 06/05/14 0117 06/05/14 0805  CKTOTAL 92  --   --   --   --   TROPONINI <0.30  < > <0.30 <0.30 <0.30  < > = values in this interval not displayed. BNP:  Recent Labs Lab 06/03/14 0622 06/04/14 1714  PROBNP 3336.0* 2283.0*   Hemoglobin A1C:  Recent Labs Lab 06/03/14 1348  HGBA1C 5.1   Coagulation:  Recent Labs Lab 06/03/14 0623 06/04/14 1315 06/04/14 1714 06/05/14 0805  LABPROT 23.8* 21.8* 20.4* 19.0*  INR 2.11* 1.88* 1.73* 1.57*   Urine Drug Screen: Drugs of Abuse     Component Value Date/Time   LABOPIA NONE DETECTED 06/05/2014 0101   LABOPIA NEGATIVE 04/29/2014 0933   COCAINSCRNUR NONE DETECTED 06/05/2014 0101   COCAINSCRNUR NEGATIVE 04/29/2014 0933   LABBENZ NONE DETECTED 06/05/2014 0101   LABBENZ NEGATIVE 04/29/2014 0933   AMPHETMU NONE DETECTED 06/05/2014 0101   AMPHETMU NEGATIVE 04/29/2014 0933   THCU NONE DETECTED  06/05/2014 0101   LABBARB NONE DETECTED 06/05/2014 0101    Alcohol Level:  Recent Labs Lab 06/03/14 0623 06/05/14 0137  ETH <11 <11   Urinalysis:  Recent Labs Lab 06/03/14 1156 06/05/14 0101  COLORURINE YELLOW STRAW*  LABSPEC 1.026 1.007  PHURINE 5.0 6.0  GLUCOSEU NEGATIVE NEGATIVE  HGBUR MODERATE* MODERATE*  BILIRUBINUR NEGATIVE NEGATIVE  KETONESUR 15* NEGATIVE  PROTEINUR NEGATIVE NEGATIVE  UROBILINOGEN 0.2 0.2  NITRITE NEGATIVE NEGATIVE  LEUKOCYTESUR NEGATIVE NEGATIVE     Micro Results: Recent Results (from the past 240 hour(s))  Blood culture (routine x 2)     Status: None (Preliminary result)   Collection Time: 06/03/14  8:02 AM  Result Value Ref Range Status   Specimen Description BLOOD LEFT ARM  Final   Special Requests BOTTLES DRAWN AEROBIC AND ANAEROBIC 10CC  Final    Culture  Setup Time   Final    06/03/2014 15:35 Performed at [facility]    Culture   Final           BLOOD CULTURE RECEIVED NO GROWTH TO DATE CULTURE WILL BE HELD FOR 5 DAYS BEFORE ISSUING A FINAL NEGATIVE REPORT Note: Culture results may be compromised due to an excessive volume of blood received in culture bottles. Performed at [facility]    Report Status PENDING  Incomplete  Blood culture (routine x 2)     Status: None (Preliminary result)   Collection Time: 06/03/14  8:10 AM  Result Value Ref Range Status   Specimen Description BLOOD RIGHT HAND  Final   Special Requests BOTTLES DRAWN AEROBIC ONLY 8CC  Final   Culture  Setup Time   Final    06/03/2014 15:37 Performed at [facility]    Culture   Final           BLOOD CULTURE RECEIVED NO GROWTH TO DATE CULTURE WILL BE HELD FOR 5 DAYS BEFORE ISSUING A FINAL NEGATIVE REPORT Performed at [facility]    Report Status PENDING  Incomplete  MRSA PCR Screening     Status: None   Collection Time: 06/03/14  4:37 PM  Result Value Ref Range Status   MRSA by PCR NEGATIVE NEGATIVE Final    Comment:        The GeneXpert MRSA Assay (FDA approved for NASAL specimens only), is one component of a comprehensive MRSA colonization surveillance program. It is not intended to diagnose MRSA infection nor to guide or monitor treatment for MRSA infections.   MRSA PCR Screening     Status: None   Collection Time: 06/04/14  7:58 PM  Result Value Ref Range Status   MRSA by PCR NEGATIVE NEGATIVE Final    Comment:        The GeneXpert MRSA Assay (FDA approved for NASAL specimens only), is one component of a comprehensive MRSA colonization surveillance program. It is not intended to diagnose MRSA infection nor to guide or monitor treatment for MRSA infections.    Studies/Results: X-ray Chest Pa And Lateral  06/05/2014   CLINICAL DATA:  68 year old male with acute shortness of Breath. Initial encounter.   EXAM: CHEST  2 VIEW  COMPARISON:  06/04/2014 and earlier.  FINDINGS: Further regressed basilar predominant interstitial opacity, minimal residual. No pneumothorax, pleural effusion or consolidation. Mild cardiomegaly. Stable cardiac size and mediastinal contours. Visualized tracheal air column is within normal limits. Cardiac event recorder re - identified. No acute osseous abnormality identified.  IMPRESSION: Further regression of basilar predominant interstitial edema. No new  cardiopulmonary abnormality.   Electronically Signed   By: Lars Pinks M.D.   On: 06/05/2014 08:16   Dg Chest Port 1 View  06/04/2014   CLINICAL DATA:  69 year old male with acute shortness of breath, respiratory distress. Recently discharged from hospital. Initial encounter.  EXAM: PORTABLE CHEST - 1 VIEW  COMPARISON:  06/03/2014 and earlier.  FINDINGS: Portable AP upright view at 1730 hrs. Mildly regressed basilar predominant interstitial edema since yesterday. Stable cardiac size and mediastinal contours. No pneumothorax. No pleural effusion or consolidation.  IMPRESSION: Since yesterday regressed but not resolved basilar predominant interstitial opacity favored due to pulmonary edema. No new cardiopulmonary abnormality identified.   Electronically Signed   By: Lars Pinks M.D.   On: 06/04/2014 17:56   Medications: I have reviewed the patient's current medications. Scheduled Meds: . amiodarone  200 mg Oral Daily  . antiseptic oral rinse  7 mL Mouth Rinse BID  . carvedilol  12.5 mg Oral BID WC  . enoxaparin (LOVENOX) injection  40 mg Subcutaneous Q24H  . fluticasone  1 puff Inhalation BID  . hydroxyurea  1,500 mg Oral Daily  . sodium chloride  3 mL Intravenous Q12H  . tiotropium  18 mcg Inhalation Daily  . Warfarin - Pharmacist Dosing Inpatient   Does not apply q1800   Continuous Infusions:  PRN Meds:.albuterol Assessment/Plan:  [name] is a 68 year old male with a-fib on amiodarone & warfarin (INR 3 on 11/9),  h/o polysubstance abuse (EtOH, heroin, tobacco), HTN, CHF (EF 09-73%, grade 1 diastolic dysfunction), polycythemia, COPD hospitalized for altered mental status.  Altered mental status: Likely 2/2 hypoxia as it's consistent with his prior episode just 2 days ago though cause of hypoxia has yet to be determined other than flash pulmonary edema 2/2 HTN as he has had prior hospitalizations for this problem. Cardiac causes less likely given troponins negative x 3. Endocrine causes possible given the limited duration of these episodes that seem to be unprovoked, especially given the elevated BP (SBP 200+), as would be seen with a pheochromocytoma though electrolytes unremarkable this AM. Pulmonary process less likely given improved CXR findings and appearance on exam. Toxin ingestion less likely given UDS results. Myoglobinuria persistent from prior hospitalizations may indicate myopathy though neurologic exam otherwise stable. He appears at baseline on interview today again. -Recheck TSH -Check urine metanephrines & electrolytes -Consult PT to assess strength  CHF: Continue checking daily weights, I&O.  COPD: Mild wheezing on exam.  -Continue Spiriva, Flovent, Duonebs q6h prn.  Polysubstance abuse: Continue methadone 25mg .   A-fib: Continue amiodarone & carvedilol.  Polycythemia: Hb stable at 13, Hct 38.  -Continue hydroxyurea 1500mg  QD  HTN: BP now trending 140-160/70-80. -Continue carvedilol.  #FEN:  -Diet: Heart Healthy  #DVT prophylaxis: Lovenox  #CODE STATUS: FULL CODE   Dispo: Disposition is deferred at this time, awaiting improvement of current medical problems.    The patient does have a current PCP Cresenciano Genre, MD) and does need an [facility] hospital follow-up appointment after discharge.  The patient does not have transportation limitations that hinder transportation to clinic appointments.  .Services Needed at time of discharge: Y = Yes, Blank = No PT:   OT:   RN:    Equipment:   Other:     LOS: 1 day   Charlott Rakes, MD 06/05/2014, 11:25 AM

## 2014-06-05 NOTE — Progress Notes (Signed)
Pt off bipap on room air at this time

## 2014-06-05 NOTE — Progress Notes (Signed)
24 hour urine collection started at 1130, placed on ice per laboratory statement. Collection to end at 1130 06/06/14.

## 2014-06-05 NOTE — Progress Notes (Signed)
Patient is on room air at this time with no distress. Sats are 97%.
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
## 2014-06-06 ENCOUNTER — Observation Stay (HOSPITAL_COMMUNITY): Payer: Medicare Other

## 2014-06-06 DIAGNOSIS — R0902 Hypoxemia: Secondary | ICD-10-CM

## 2014-06-06 DIAGNOSIS — J9601 Acute respiratory failure with hypoxia: Secondary | ICD-10-CM

## 2014-06-06 LAB — PROTIME-INR
INR: 1.39 (ref 0.00–1.49)
Prothrombin Time: 17.2 seconds — ABNORMAL HIGH (ref 11.6–15.2)

## 2014-06-06 LAB — BASIC METABOLIC PANEL
Anion gap: 9 (ref 5–15)
BUN: 23 mg/dL (ref 6–23)
CHLORIDE: 101 meq/L (ref 96–112)
CO2: 29 mEq/L (ref 19–32)
CREATININE: 1.24 mg/dL (ref 0.50–1.35)
Calcium: 8.9 mg/dL (ref 8.4–10.5)
GFR calc non Af Amer: 58 mL/min — ABNORMAL LOW (ref >90)
GFR, EST AFRICAN AMERICAN: 68 mL/min — AB (ref >90)
GLUCOSE: 90 mg/dL (ref 70–99)
Potassium: 4.8 mEq/L (ref 3.7–5.3)
Sodium: 139 mEq/L (ref 137–147)

## 2014-06-06 LAB — CBC
HCT: 38.9 % — ABNORMAL LOW (ref 39.0–52.0)
HEMOGLOBIN: 12.5 g/dL — AB (ref 13.0–17.0)
MCH: 39.2 pg — AB (ref 26.0–34.0)
MCHC: 32.1 g/dL (ref 30.0–36.0)
MCV: 121.9 fL — AB (ref 78.0–100.0)
Platelets: 130 10*3/uL — ABNORMAL LOW (ref 150–400)
RBC: 3.19 MIL/uL — ABNORMAL LOW (ref 4.22–5.81)
RDW: 13.7 % (ref 11.5–15.5)
WBC: 5.4 10*3/uL (ref 4.0–10.5)

## 2014-06-06 MED ORDER — FUROSEMIDE 40 MG PO TABS
40.0000 mg | ORAL_TABLET | Freq: Once | ORAL | Status: AC
  Administered 2014-06-06: 40 mg via ORAL
  Filled 2014-06-06: qty 1

## 2014-06-06 MED ORDER — WARFARIN SODIUM 5 MG PO TABS
5.0000 mg | ORAL_TABLET | Freq: Once | ORAL | Status: AC
  Administered 2014-06-06: 5 mg via ORAL
  Filled 2014-06-06: qty 1

## 2014-06-06 NOTE — Progress Notes (Signed)
Loop recorder 
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
## 2014-06-06 NOTE — Progress Notes (Signed)
PT Cancellation Note  Patient Details Name: [name] MRN: [id number] DOB: March 14, 1946   Cancelled Treatment:    Reason Eval/Treat Not Completed: PT screened, no needs identified, will sign off; RN reports patient ambulated in hallway independent this morning.  She reports no PT needs.  Will sign off.   Jerrick Farve,CYNDI 06/06/2014, 9:02 AM
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
## 2014-06-06 NOTE — Progress Notes (Signed)
UR completed 

## 2014-06-07 DIAGNOSIS — I359 Nonrheumatic aortic valve disorder, unspecified: Secondary | ICD-10-CM

## 2014-06-07 LAB — BASIC METABOLIC PANEL
Anion gap: 12 (ref 5–15)
BUN: 25 mg/dL — AB (ref 6–23)
CO2: 30 mEq/L (ref 19–32)
CREATININE: 1.16 mg/dL (ref 0.50–1.35)
Calcium: 8.5 mg/dL (ref 8.4–10.5)
Chloride: 100 mEq/L (ref 96–112)
GFR calc Af Amer: 73 mL/min — ABNORMAL LOW (ref >90)
GFR, EST NON AFRICAN AMERICAN: 63 mL/min — AB (ref >90)
Glucose, Bld: 81 mg/dL (ref 70–99)
Potassium: 4.4 mEq/L (ref 3.7–5.3)
SODIUM: 142 meq/L (ref 137–147)

## 2014-06-07 LAB — DRUGS OF ABUSE SCREEN W/O ALC, ROUTINE URINE
Amphetamine Screen, Ur: NEGATIVE
Barbiturate Quant, Ur: NEGATIVE
Benzodiazepines.: NEGATIVE
COCAINE METABOLITES: NEGATIVE
CREATININE, U: 22.5 mg/dL
Marijuana Metabolite: NEGATIVE
Methadone: POSITIVE — AB
OPIATE SCREEN, URINE: NEGATIVE
PHENCYCLIDINE (PCP): NEGATIVE
Propoxyphene: NEGATIVE

## 2014-06-07 LAB — PROTIME-INR
INR: 1.5 — ABNORMAL HIGH (ref 0.00–1.49)
Prothrombin Time: 18.2 seconds — ABNORMAL HIGH (ref 11.6–15.2)

## 2014-06-07 MED ORDER — WARFARIN SODIUM 5 MG PO TABS
5.0000 mg | ORAL_TABLET | Freq: Once | ORAL | Status: AC
  Administered 2014-06-07: 5 mg via ORAL
  Filled 2014-06-07: qty 1

## 2014-06-07 MED ORDER — AMLODIPINE BESYLATE 5 MG PO TABS
5.0000 mg | ORAL_TABLET | Freq: Every day | ORAL | Status: DC
  Administered 2014-06-07 – 2014-06-08 (×2): 5 mg via ORAL
  Filled 2014-06-07 (×2): qty 1

## 2014-06-07 MED ORDER — LISINOPRIL 5 MG PO TABS
5.0000 mg | ORAL_TABLET | Freq: Every day | ORAL | Status: DC
  Filled 2014-06-07: qty 1

## 2014-06-07 MED ORDER — FUROSEMIDE 40 MG PO TABS
40.0000 mg | ORAL_TABLET | Freq: Once | ORAL | Status: AC
  Administered 2014-06-08: 40 mg via ORAL
  Filled 2014-06-07: qty 1

## 2014-06-07 MED ORDER — FUROSEMIDE 40 MG PO TABS
40.0000 mg | ORAL_TABLET | Freq: Once | ORAL | Status: AC
Start: 2014-06-07 — End: 2014-06-07
  Administered 2014-06-07: 40 mg via ORAL
  Filled 2014-06-07: qty 1

## 2014-06-07 NOTE — Progress Notes (Signed)
ANTICOAGULATION CONSULT NOTE - Follow Up Consult  Pharmacy Consult for Coumadin Indication: atrial fibrillation  Allergies  Allergen Reactions  . Codeine Rash    Patient Measurements: Height: 5\' 6"  (167.6 cm) Weight: 186 lb 15.2 oz (84.8 kg) IBW/kg (Calculated) : 63.8  Vital Signs: Temp: 98.3 F (36.8 C) (11/19 0452) Temp Source: Oral (11/19 0452) BP: 149/68 mmHg (11/19 0452) Pulse Rate: 50 (11/19 0452)  Labs:  Recent Labs  06/04/14 1714 06/05/14 0117 06/05/14 0805 06/05/14 1239 06/06/14 0237 06/07/14 0521  HGB 13.6  --  12.5*  --  12.5*  --   HCT 42.0  --  38.1*  --  38.9*  --   PLT 148*  --  123*  --  130*  --   APTT 31  --   --   --   --   --   LABPROT 20.4*  --  19.0*  --  17.2* 18.2*  INR 1.73*  --  1.57*  --  1.39 1.50*  CREATININE 1.27  --  1.13  --  1.24 1.16  TROPONINI  --  <0.30 <0.30 <0.30  --   --     Estimated Creatinine Clearance: 63.1 mL/min (by C-G formula based on Cr of 1.16).  Medications:  Prescriptions prior to admission  Medication Sig Dispense Refill Last Dose  . albuterol (PROAIR HFA) 108 (90 BASE) MCG/ACT inhaler Inhale 1-2 puffs into the lungs daily as needed for wheezing or shortness of breath. 18 g 5 06/04/2014 at Unknown time  . amiodarone (PACERONE) 200 MG tablet Take 200 mg by mouth daily.   Past Week at Unknown time  . Budesonide (PULMICORT FLEXHALER) 90 MCG/ACT inhaler Inhale 1 puff into the lungs every evening.   06/03/2014 at Unknown time  . carvedilol (COREG) 12.5 MG tablet Take 1 tablet (12.5 mg total) by mouth 2 (two) times daily with a meal. 60 tablet 2 Past Week at Unknown time  . hydroxyurea (HYDREA) 500 MG capsule take 3 capsules by mouth once daily *MAY TAKE WITH FOOD TO MINIMIZE GI SIDE EFFECTS 90 capsule 3 Past Week at Unknown time  . methadone (DOLOPHINE) 10 MG/5ML solution Take 26-30 mg by mouth every morning.   06/04/2014 at Unknown time  . tiotropium (SPIRIVA HANDIHALER) 18 MCG inhalation capsule Place 1 capsule (18  mcg total) into inhaler and inhale daily. 30 capsule 12 06/04/2014 at Unknown time  . warfarin (COUMADIN) 5 MG tablet Take 2.5-5 mg by mouth See admin instructions. Take 2.5 mg (0.5 tablet) daily except 5 mg (1 tablet) on Mondays   06/04/2014 at Unknown time    Assessment: 68 yo male admitted 06/04/2014  with AMS, h/o Afib. He continues on warfarin for history of afib. INR remains subtherapeutic at 1.5 but now trending back up. CBC stable as of 11/18. No bleeding noted.   Goal of Therapy:  INR 2-3   Plan:  1. Coumadin 5mg  PO x 1 tonight 2. F/u AM INR 3. DC lovenox when INR is at goal  Salome Arnt, PharmD, BCPS Pager # [phone] 06/07/2014 10:44 AM

## 2014-06-07 NOTE — Progress Notes (Signed)
Pt seen and examined with Dr. Posey Pronto. Please refer to resident note for details  Pt feels well. No new complaints  Physical Exam: CVS: RRR, normal heart sounds Pulm: CTA b/l Abd: soft, non tender, BS + Ext: trace pedal edema b/l Gen: AAO*3, NAD  Assessment and Plan: 68 y/o male with recurrent hypoxia, AMS  Acute hypoxic resp failure: - now resolved. - Unsure etiology. Likely secondary to heart failure - It is possible that patient with intermittent episodes of severe HTN causing heart failure and hypoxia leading to AMS - Will f/u metanephrines to r/o pheochromoctoma - Will f/u 2 D ECHO - CT head with no acute path - Will give 1 more dose of IV lasix today and transition him to low dose PO lasix in the AM - Will monitor BP and consider adding anti hypertensive if BP elevated

## 2014-06-07 NOTE — Plan of Care (Signed)
Problem: Phase I Progression Outcomes Goal: Up in chair, BRP Outcome: Completed/Met Date Met:  06/07/14 Goal: Voiding-avoid urinary catheter unless indicated Outcome: Completed/Met Date Met:  06/07/14  Problem: Phase II Progression Outcomes Goal: Pain controlled Outcome: Completed/Met Date Met:  06/07/14 Goal: Dyspnea controlled with activity Outcome: Completed/Met Date Met:  06/07/14 Goal: Walk in hall or up in chair TID Outcome: Completed/Met Date Met:  06/07/14 Goal: Tolerating diet Outcome: Completed/Met Date Met:  06/07/14

## 2014-06-07 NOTE — Progress Notes (Signed)
UR completed 
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
## 2014-06-07 NOTE — Discharge Instructions (Signed)
Thank you for trusting [place] with your medical care!  You were hospitalized for acute respiratory failure which we thinks was related to your heart failure.   Information on my medicine - Coumadin   (Warfarin)  This medication education was reviewed with me or my healthcare representative as part of my discharge preparation.    Why was Coumadin prescribed for you? Coumadin was prescribed for you because you have a blood clot or a medical condition that can cause an increased risk of forming blood clots. Blood clots can cause serious health problems by blocking the flow of blood to the heart, lung, or brain. Coumadin can prevent harmful blood clots from forming. As a reminder your indication for Coumadin is:   Stroke Prevention Because Of Atrial Fibrillation  What test will check on my response to Coumadin? While on Coumadin (warfarin) you will need to have an INR test regularly to ensure that your dose is keeping you in the desired range. The INR (international normalized ratio) number is calculated from the result of the laboratory test called prothrombin time (PT).  If an INR APPOINTMENT HAS NOT ALREADY BEEN MADE FOR YOU please schedule an appointment to have this lab work done by your health care provider within 7 days. Your INR goal is usually a number between:  2 to 3 or your provider may give you a more narrow range like 2-2.5.  Ask your health care provider during an office visit what your goal INR is.  What  do you need to  know  About  COUMADIN? Take Coumadin (warfarin) exactly as prescribed by your healthcare provider about the same time each day.  DO NOT stop taking without talking to the doctor who prescribed the medication.  Stopping without other blood clot prevention medication to take the place of Coumadin may increase your risk of developing a new clot or stroke.  Get refills before you run out.  What do you do if you miss a dose? If you miss a dose, take it as soon as you  remember on the same day then continue your regularly scheduled regimen the next day.  Do not take two doses of Coumadin at the same time.  Important Safety Information A possible side effect of Coumadin (Warfarin) is an increased risk of bleeding. You should call your healthcare provider right away if you experience any of the following: ? Bleeding from an injury or your nose that does not stop. ? Unusual colored urine (red or dark brown) or unusual colored stools (red or black). ? Unusual bruising for unknown reasons. ? A serious fall or if you hit your head (even if there is no bleeding).  Some foods or medicines interact with Coumadin (warfarin) and might alter your response to warfarin. To help avoid this: ? Eat a balanced diet, maintaining a consistent amount of Vitamin K. ? Notify your provider about major diet changes you plan to make. ? Avoid alcohol or limit your intake to 1 drink for women and 2 drinks for men per day. (1 drink is 5 oz. wine, 12 oz. beer, or 1.5 oz. liquor.)  Make sure that ANY health care provider who prescribes medication for you knows that you are taking Coumadin (warfarin).  Also make sure the healthcare provider who is monitoring your Coumadin knows when you have started a new medication including herbals and non-prescription products.  Coumadin (Warfarin)  Major Drug Interactions  Increased Warfarin Effect Decreased Warfarin Effect  Alcohol (large quantities) Antibiotics (esp.  Septra/Bactrim, Flagyl, Cipro) Amiodarone (Cordarone) Aspirin (ASA) Cimetidine (Tagamet) Megestrol (Megace) NSAIDs (ibuprofen, naproxen, etc.) Piroxicam (Feldene) Propafenone (Rythmol SR) Propranolol (Inderal) Isoniazid (INH) Posaconazole (Noxafil) Barbiturates (Phenobarbital) Carbamazepine (Tegretol) Chlordiazepoxide (Librium) Cholestyramine (Questran) Griseofulvin Oral Contraceptives Rifampin Sucralfate (Carafate) Vitamin K   Coumadin (Warfarin) Major Herbal  Interactions  Increased Warfarin Effect Decreased Warfarin Effect  Garlic Ginseng Ginkgo biloba Coenzyme Q10 Green tea St. Johns wort    Coumadin (Warfarin) FOOD Interactions  Eat a consistent number of servings per week of foods HIGH in Vitamin K (1 serving =  cup)  Collards (cooked, or boiled & drained) Kale (cooked, or boiled & drained) Mustard greens (cooked, or boiled & drained) Parsley *serving size only =  cup Spinach (cooked, or boiled & drained) Swiss chard (cooked, or boiled & drained) Turnip greens (cooked, or boiled & drained)  Eat a consistent number of servings per week of foods MEDIUM-HIGH in Vitamin K (1 serving = 1 cup)  Asparagus (cooked, or boiled & drained) Broccoli (cooked, boiled & drained, or raw & chopped) Brussel sprouts (cooked, or boiled & drained) *serving size only =  cup Lettuce, raw (green leaf, endive, romaine) Spinach, raw Turnip greens, raw & chopped   These websites have more information on Coumadin (warfarin):  FailFactory.se; VeganReport.com.au;

## 2014-06-07 NOTE — Progress Notes (Signed)
Echocardiogram 2D Echocardiogram has been performed.  [name] 06/07/2014, 2:43 PM
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
## 2014-06-08 LAB — HCV RNA QUANT RFLX ULTRA OR GENOTYP: HCV QUANT: NOT DETECTED [IU]/mL — AB (ref <15)

## 2014-06-08 LAB — PROTIME-INR
INR: 1.6 — ABNORMAL HIGH (ref 0.00–1.49)
Prothrombin Time: 19.2 seconds — ABNORMAL HIGH (ref 11.6–15.2)

## 2014-06-08 MED ORDER — AMLODIPINE BESYLATE 5 MG PO TABS: 5.0000 mg | ORAL_TABLET | Freq: Every day | ORAL | Status: DC

## 2014-06-08 MED ORDER — FUROSEMIDE 20 MG PO TABS: 20.0000 mg | ORAL_TABLET | Freq: Every morning | ORAL | Status: DC

## 2014-06-08 MED ORDER — WARFARIN SODIUM 5 MG PO TABS
5.0000 mg | ORAL_TABLET | Freq: Once | ORAL | Status: DC
Start: 2014-06-08 — End: 2014-06-08
  Filled 2014-06-08: qty 1

## 2014-06-08 NOTE — Progress Notes (Signed)
ANTICOAGULATION CONSULT NOTE - Follow Up Consult  Pharmacy Consult for Coumadin Indication: atrial fibrillation  Allergies  Allergen Reactions  . Codeine Rash    Patient Measurements: Height: 5\' 6"  (167.6 cm) Weight: 184 lb 11.2 oz (83.779 kg) (a scale) IBW/kg (Calculated) : 63.8  Vital Signs: Temp: 98 F (36.7 C) (11/20 0452) Temp Source: Oral (11/20 0452) BP: 160/65 mmHg (11/20 0452) Pulse Rate: 44 (11/20 0452)  Labs:  Recent Labs  06/05/14 1239 06/06/14 0237 06/07/14 0521 06/08/14 0528  HGB  --  12.5*  --   --   HCT  --  38.9*  --   --   PLT  --  130*  --   --   LABPROT  --  17.2* 18.2* 19.2*  INR  --  1.39 1.50* 1.60*  CREATININE  --  1.24 1.16  --   TROPONINI <0.30  --   --   --     Estimated Creatinine Clearance: 62.8 mL/min (by C-G formula based on Cr of 1.16).  Medications:  Prescriptions prior to admission  Medication Sig Dispense Refill Last Dose  . albuterol (PROAIR HFA) 108 (90 BASE) MCG/ACT inhaler Inhale 1-2 puffs into the lungs daily as needed for wheezing or shortness of breath. 18 g 5 06/04/2014 at Unknown time  . amiodarone (PACERONE) 200 MG tablet Take 200 mg by mouth daily.   Past Week at Unknown time  . Budesonide (PULMICORT FLEXHALER) 90 MCG/ACT inhaler Inhale 1 puff into the lungs every evening.   06/03/2014 at Unknown time  . carvedilol (COREG) 12.5 MG tablet Take 1 tablet (12.5 mg total) by mouth 2 (two) times daily with a meal. 60 tablet 2 Past Week at Unknown time  . hydroxyurea (HYDREA) 500 MG capsule take 3 capsules by mouth once daily *MAY TAKE WITH FOOD TO MINIMIZE GI SIDE EFFECTS 90 capsule 3 Past Week at Unknown time  . methadone (DOLOPHINE) 10 MG/5ML solution Take 26-30 mg by mouth every morning.   06/04/2014 at Unknown time  . tiotropium (SPIRIVA HANDIHALER) 18 MCG inhalation capsule Place 1 capsule (18 mcg total) into inhaler and inhale daily. 30 capsule 12 06/04/2014 at Unknown time  . warfarin (COUMADIN) 5 MG tablet Take 2.5-5  mg by mouth See admin instructions. Take 2.5 mg (0.5 tablet) daily except 5 mg (1 tablet) on Mondays   06/04/2014 at Unknown time    Assessment: 68 yo male admitted 06/04/2014  with AMS, h/o Afib. He continues on warfarin for history of afib. INR remains subtherapeutic at 1.6 but now trending back up. CBC stable as of 11/18. No bleeding noted.   Goal of Therapy:  INR 2-3   Plan:  1. Coumadin 5mg  PO x 1 tonight 2. F/u AM INR 3. DC lovenox when INR is at goal  [facility] Diona Foley, PharmD Clinical Pharmacist Pager [phone] 06/08/2014 10:05 AM
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
## 2014-06-08 NOTE — Progress Notes (Addendum)
Pt seen and examined with Dr. Posey Pronto. Please refer to resident note for details  Pt feels well. No new complaints  Physical Exam: CVS: RRR, normal heart sounds Pulm: CTA b/l Abd: soft, non tender, BS + Ext: trace pedal edema b/l Gen: AAO*3, NAD  Assessment and Plan: 68 y/o male with recurrent hypoxia, AMS  Acute hypoxic resp failure: - now resolved. - Likely secondary to heart failure - Will f/u metanephrines to r/o pheochromoctoma - 2 D ECHO noted. Pt with normal systolic function, moderate AI and grade 1 diastolic dysfunction - CT head with no acute path - c/w lasix 20 mg PO daily - Started on norvasc for BP control  Afib: - c.w amio, coreg and warfarin - INR subtherapeutic. - Will need INR recheck on Monday in [facility] clinic -  Will discuss with pharmacy but patient will not likely need lovenox bridging  HCV: - f/u HCV titre and genotype - May need referral to [facility]  Pt stable for d/c home today

## 2014-06-09 LAB — CULTURE, BLOOD (ROUTINE X 2)
CULTURE: NO GROWTH
Culture: NO GROWTH

## 2014-06-09 LAB — METANEPHRINES, URINE, 24 HOUR
Metaneph Total, Ur: 511 mcg/24 h (ref 224–832)
Metanephrines, Ur: 141 mcg/24 h (ref 90–315)
NORMETANEPHRINE 24H UR: 370 ug/(24.h) (ref 122–676)
VOLUME, URINE-METAN: 2800 mL

## 2014-06-11 ENCOUNTER — Ambulatory Visit: Payer: Medicare Other

## 2014-06-11 LAB — METHADONE, CONFIRMATION
Methadone GC/MS Conf: 264 ng/mL — ABNORMAL HIGH (ref <100)
Methadone metabolite, urine: 246 ng/mL — ABNORMAL HIGH (ref <100)

## 2014-06-12 ENCOUNTER — Ambulatory Visit (INDEPENDENT_AMBULATORY_CARE_PROVIDER_SITE_OTHER): Payer: Medicare Other | Admitting: Internal Medicine

## 2014-06-12 ENCOUNTER — Encounter: Payer: Self-pay | Admitting: Internal Medicine

## 2014-06-12 VITALS — BP 108/57 | HR 54 | Temp 97.8°F | Ht 66.0 in | Wt 195.3 lb

## 2014-06-12 DIAGNOSIS — I482 Chronic atrial fibrillation, unspecified: Secondary | ICD-10-CM

## 2014-06-12 DIAGNOSIS — N179 Acute kidney failure, unspecified: Secondary | ICD-10-CM

## 2014-06-12 DIAGNOSIS — I10 Essential (primary) hypertension: Secondary | ICD-10-CM

## 2014-06-12 DIAGNOSIS — R0600 Dyspnea, unspecified: Secondary | ICD-10-CM

## 2014-06-12 DIAGNOSIS — I4891 Unspecified atrial fibrillation: Secondary | ICD-10-CM

## 2014-06-12 LAB — BASIC METABOLIC PANEL WITH GFR
BUN: 26 mg/dL — ABNORMAL HIGH (ref 6–23)
CHLORIDE: 104 meq/L (ref 96–112)
CO2: 32 meq/L (ref 19–32)
Calcium: 8.3 mg/dL — ABNORMAL LOW (ref 8.4–10.5)
Creat: 1.3 mg/dL (ref 0.50–1.35)
GFR, Est African American: 65 mL/min
GFR, Est Non African American: 56 mL/min — ABNORMAL LOW
GLUCOSE: 82 mg/dL (ref 70–99)
Potassium: 4.4 mEq/L (ref 3.5–5.3)
SODIUM: 141 meq/L (ref 135–145)

## 2014-06-12 NOTE — Progress Notes (Signed)
   Subjective:    Patient ID: [name], male    DOB: [birth year], 68 y.o.   MRN: [id number]  HPI [name] is a 68 year old man with PMH of diastolic heart failure, A. Fib on coumadin, HTN, hx of polysubstance abuse on methadone, who presents for hospital follow up visit. He was hospitalized earlier this month with discharge on 06/07/14 for acute respiratory failure thought to be due to volume overload. His shortness of breath improved prior to his discharge but is still present. His lower extremity edema has also improved.   He goes to his methadone clinic everyday and denies taking extra doses recently.   Review of Systems  Constitutional: Negative for fever, chills, diaphoresis, activity change, appetite change and fatigue.  Respiratory: Positive for shortness of breath. Negative for cough and wheezing.   Cardiovascular: Negative for chest pain, palpitations and leg swelling.  Gastrointestinal: Negative for abdominal pain and abdominal distention.  Genitourinary: Negative for dysuria, frequency and difficulty urinating.  Neurological: Negative for dizziness and light-headedness.  Psychiatric/Behavioral: Negative for agitation.       Objective:   Physical Exam  Constitutional: He is oriented to person, place, and time. He appears well-developed and well-nourished. No distress.  Eyes: EOM are normal. Pupils are equal, round, and reactive to light.  Cardiovascular: Normal rate and regular rhythm.   Murmur heard. No JVD noted  Pulmonary/Chest: Effort normal. No respiratory distress. He has no wheezes. He has no rales.  Abdominal: Soft. There is no tenderness.  Obese  Musculoskeletal: He exhibits edema. He exhibits no tenderness.  1+ pitting edema bilaterally up to his knees  Neurological: He is alert and oriented to person, place, and time. Coordination normal.  Skin: Skin is warm and dry. He is not diaphoretic.  Psychiatric: He has a normal mood and affect.   Nursing note and vitals reviewed.         Assessment & Plan:

## 2014-06-12 NOTE — Assessment & Plan Note (Signed)
Weight is up per our scale but this may not be accurate. He is slightly hypervolemic with no rales, SOB at baseline.  Counseled pt to avoid sodium, provided information on DASH diet.

## 2014-06-12 NOTE — Assessment & Plan Note (Signed)
Recent Creatinine has fluctuated but baseline appears to be close to 0.8-1.0. Last Cr of 1.16 while in the hospital. Has hx of AKI with Lasix 40mg  daily.   Repeat BMET today

## 2014-06-12 NOTE — Assessment & Plan Note (Addendum)
Rate and rhythm controlled today.  He has follow up appointment with Dr. Elie Confer at the [facility] on Monday, 11/30.

## 2014-06-12 NOTE — Addendum Note (Signed)
Addended by: Adele Barthel D on: 06/12/2014 05:12 PM   Modules accepted: Level of Service

## 2014-06-12 NOTE — Assessment & Plan Note (Signed)
BP Readings from Last 3 Encounters:  06/12/14 108/57  06/08/14 160/65  06/04/14 150/72    Lab Results  Component Value Date   NA 142 06/07/2014   K 4.4 06/07/2014   CREATININE 1.16 06/07/2014    Assessment: Blood pressure control:  Controlled Progress toward BP goal:   at goal Comments: he is on coreg 12.5 mg BID, Norvasc 5mg  daily, Lasix 20mg  daily, amiodarone 200mg  daily  Plan: Medications:  continue current medications Educational resources provided: brochure, handout, video Self management tools provided:   Other plans: Provided information on low sodium diet

## 2014-06-12 NOTE — Patient Instructions (Signed)
General Instructions: -Continue taking your medications as prescribed.  -You may purchase a scale and check your weight everyday.   -Follow up with Dr. Aundra Dubin in 1-2 months to discuss the colonoscopy and other medical problems.     Please bring your medicines with you each time you come to clinic.  Medicines may include prescription medications, over-the-counter medications, herbal remedies, eye drops, vitamins, or other pills.   Progress Toward Treatment Goals:  Treatment Goal 05/24/2014  Blood pressure at goal  Stop smoking smoking less    Self Care Goals & Plans:  Self Care Goal 06/12/2014  Manage my medications take my medicines as prescribed; bring my medications to every visit; refill my medications on time; follow the sick day instructions if I am sick  Monitor my health -  Eat healthy foods eat more vegetables; eat fruit for snacks and desserts; eat baked foods instead of fried foods; eat foods that are low in salt; eat smaller portions  Be physically active find an activity I enjoy  Stop smoking -  Meeting treatment goals -    No flowsheet data found.   Care Management & Community Referrals:  Referral 05/24/2014  Referrals made for care management support none needed  Referrals made to community resources none

## 2014-06-13 ENCOUNTER — Telehealth: Payer: Self-pay | Admitting: Internal Medicine

## 2014-06-13 NOTE — Progress Notes (Signed)
Internal Medicine Clinic Attending  Case discussed with Dr. Kennerly soon after the resident saw the patient.  We reviewed the resident's history and exam and pertinent patient test results.  I agree with the assessment, diagnosis, and plan of care documented in the resident's note.  

## 2014-06-13 NOTE — Telephone Encounter (Signed)
Called pt, his significant, [name] answered and affirmed that she would give him the message--the patient had given me permission during his visit with me to leave a message with his spouse.   Per his BMET, his Cr has trended up which is likely due to his use of Lasix 20mg  daily (he has a hx of AKI with Lasix 40mg  daily).   The patient was instructed to stop Lasix and follow up with [place] as needed or within one month-- for repeat labs and volume status evaluation.   Sent message to the front desk for follow up appointment in 2 weeks.

## 2014-06-16 ENCOUNTER — Other Ambulatory Visit: Payer: Self-pay | Admitting: Internal Medicine

## 2014-06-18 ENCOUNTER — Ambulatory Visit (INDEPENDENT_AMBULATORY_CARE_PROVIDER_SITE_OTHER): Payer: Medicare Other | Admitting: Pharmacist

## 2014-06-18 ENCOUNTER — Encounter: Payer: Self-pay | Admitting: *Deleted

## 2014-06-18 DIAGNOSIS — I4891 Unspecified atrial fibrillation: Secondary | ICD-10-CM

## 2014-06-18 DIAGNOSIS — Z7901 Long term (current) use of anticoagulants: Secondary | ICD-10-CM

## 2014-06-18 LAB — POCT INR: INR: 2

## 2014-06-18 MED ORDER — WARFARIN SODIUM 5 MG PO TABS: ORAL_TABLET | ORAL | Status: DC

## 2014-06-18 NOTE — Progress Notes (Signed)
Anti-Coagulation Progress Note  [name] is a 68 y.o. male who is currently on an anti-coagulation regimen.    RECENT RESULTS: Recent results are below, the most recent result is correlated with a dose of 20 mg. per week: Lab Results  Component Value Date   INR 2.00 06/18/2014   INR 1.60* 06/08/2014   INR 1.50* 06/07/2014    ANTI-COAG DOSE: Anticoagulation Dose Instructions as of 06/18/2014      [name] Tue Wed Thu Fri Sat   New Dose 2.5 mg 5 mg 2.5 mg 2.5 mg 2.5 mg 2.5 mg 2.5 mg       ANTICOAG SUMMARY: Anticoagulation Episode Summary    Current INR goal 2.0-3.0  Next INR check 07/16/2014  INR from last check 2.00 (06/18/2014)  Weekly max dose   Target end date Indefinite  [facility]  Preferred lab   Send INR reminders to    Indications  Atrial fibrillation [I48.91]        Comments         ANTICOAG TODAY: Anticoagulation Summary as of 06/18/2014    INR goal 2.0-3.0  Selected INR 2.00 (06/18/2014)  Next INR check 07/16/2014  Target end date Indefinite   Indications  Atrial fibrillation [I48.91]      Anticoagulation Episode Summary    [facility]   Preferred lab    Send INR reminders to    Comments       PATIENT INSTRUCTIONS: Patient Instructions  Patient instructed to take medications as defined in the Anti-coagulation Track section of this encounter.  Patient instructed to take today's dose.  Patient verbalized understanding of these instructions.       FOLLOW-UP Return in 4 weeks (on 07/16/2014) for Follow up INR at 0930h.  [name], III Pharm.D., CACP

## 2014-06-18 NOTE — Patient Instructions (Signed)
Patient instructed to take medications as defined in the Anti-coagulation Track section of this encounter.  Patient instructed to take today's dose.  Patient verbalized understanding of these instructions.    

## 2014-06-20 LAB — MDC_IDC_ENUM_SESS_TYPE_REMOTE: MDC IDC SESS DTM: 20151114050500

## 2014-06-25 ENCOUNTER — Encounter: Payer: Self-pay | Admitting: Internal Medicine

## 2014-06-25 NOTE — Addendum Note (Signed)
Addended by: Hulan Fray on: 06/25/2014 05:22 PM   Modules accepted: Orders

## 2014-06-27 ENCOUNTER — Encounter: Payer: Self-pay | Admitting: Gastroenterology

## 2014-06-27 ENCOUNTER — Encounter: Payer: Self-pay | Admitting: *Deleted

## 2014-06-28 ENCOUNTER — Encounter: Payer: Self-pay | Admitting: Internal Medicine

## 2014-06-28 ENCOUNTER — Encounter (HOSPITAL_COMMUNITY): Payer: Self-pay | Admitting: Internal Medicine

## 2014-06-29 ENCOUNTER — Ambulatory Visit (INDEPENDENT_AMBULATORY_CARE_PROVIDER_SITE_OTHER): Payer: Medicare Other | Admitting: *Deleted

## 2014-06-29 DIAGNOSIS — R55 Syncope and collapse: Secondary | ICD-10-CM

## 2014-07-02 ENCOUNTER — Ambulatory Visit (INDEPENDENT_AMBULATORY_CARE_PROVIDER_SITE_OTHER): Payer: Medicare Other | Admitting: Gastroenterology

## 2014-07-02 ENCOUNTER — Telehealth: Payer: Self-pay | Admitting: *Deleted

## 2014-07-02 ENCOUNTER — Encounter: Payer: Self-pay | Admitting: Gastroenterology

## 2014-07-02 VITALS — BP 102/64 | HR 68 | Ht 66.0 in | Wt 198.6 lb

## 2014-07-02 DIAGNOSIS — Z1211 Encounter for screening for malignant neoplasm of colon: Secondary | ICD-10-CM | POA: Insufficient documentation

## 2014-07-02 DIAGNOSIS — I4891 Unspecified atrial fibrillation: Secondary | ICD-10-CM

## 2014-07-02 DIAGNOSIS — Z7901 Long term (current) use of anticoagulants: Secondary | ICD-10-CM | POA: Insufficient documentation

## 2014-07-02 MED ORDER — MOVIPREP 100 G PO SOLR: 1.0000 | Freq: Once | ORAL | Status: DC

## 2014-07-02 NOTE — Patient Instructions (Signed)
You have been scheduled for a colonoscopy. Please follow written instructions given to you at your visit today.  Please pick up your prep kit at the pharmacy within the next 1-3 days. If you use inhalers (even only as needed), please bring them with you on the day of your procedure.  You will be contaced by our office prior to your procedure for directions on holding your Coumadin/Warfarin.  If you do not hear from our office 1 week prior to your scheduled procedure, please call [phone] to discuss.

## 2014-07-02 NOTE — Progress Notes (Signed)
07/02/2014 [name] [id number] 1945/07/24   HISTORY OF PRESENT ILLNESS:  This is a 68 year old male who is new to our practice.  He has atrial fibrillation and is on coumadin, COPD, thoracic aortic aneurysm, and history of substance abuse for which he is currently on methadone.  He presents to our office today to schedule screening colonoscopy.  He says that is last colonoscopy was in 10-15 years ago in [place], [place].  As far as he knows it was normal.  He denies any GI complaints and there is no family history of colon cancer to his knowledge.     Past Medical History  Diagnosis Date  . Cardiomyopathy     EF55% 11/14<<35%   . Substance abuse     alcohol  . Hypertension   . Atrial fibrillation /flutter     hx of flutter ablation 2/12  . Shortness of breath     "all the time lately" (12/14/2012)  . GERD (gastroesophageal reflux disease)   . Hepatitis     "think I had that once; a long time ago; from dirty needles I think" (12/14/2012)  . 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 Leukocytosis, unspecified 06/12/2013  . 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 Obesity   . Syncope   . Cancer   . CHF (congestive heart failure)   . Renal insufficiency    Past Surgical History  Procedure Laterality Date  . Hemorrhoid surgery  1970's  . Cardiac electrophysiology mapping and ablation  08/2010    [name] 09/07/2010 (12/14/2012)  . Loop recorder implant  08-22-2013    MDT LinQ implanted by Dr Caryl Comes for syncope  . Loop recorder implant N/A 08/23/2013    Procedure: LOOP RECORDER IMPLANT;  Surgeon: Deboraha Sprang, MD;  Location: [facility] C[facility] LAB;  Service: Cardiovascular;  Laterality: N/A;    reports that he has been smoking Cigarettes.  He has a 8 pack-year smoking history. He has never used smokeless tobacco. He reports that he drinks alcohol. He reports that he uses illicit drugs ("Crack" cocaine, Cocaine, and Heroin). family history includes Alcohol abuse in his father; Cirrhosis  in his father; Diabetes in his mother; Hypertension in his mother. Allergies  Allergen Reactions  . Codeine Rash      Outpatient Encounter Prescriptions as of 07/02/2014  Medication Sig  . albuterol (PROAIR HFA) 108 (90 BASE) MCG/ACT inhaler Inhale 1-2 puffs into the lungs daily as needed for wheezing or shortness of breath.  [name] amiodarone (PACERONE) 200 MG tablet Take 200 mg by mouth daily.  [name] amLODipine (NORVASC) 5 MG tablet Take 1 tablet (5 mg total) by mouth daily.  . Budesonide (PULMICORT FLEXHALER) 90 MCG/ACT inhaler Inhale 1 puff into the lungs every evening.  . carvedilol (COREG) 12.5 MG tablet Take 1 tablet (12.5 mg total) by mouth 2 (two) times daily with a meal.  . hydroxyurea (HYDREA) 500 MG capsule take 3 capsules by mouth once daily *MAY TAKE WITH FOOD TO MINIMIZE GI SIDE EFFECTS  . methadone (DOLOPHINE) 10 MG/5ML solution Take 26-30 mg by mouth every morning.  . tiotropium (SPIRIVA HANDIHALER) 18 MCG inhalation capsule Place 1 capsule (18 mcg total) into inhaler and inhale daily.  [name] warfarin (COUMADIN) 2.5 MG tablet Take 2.5 mg (1 pill) daily except Monday take 5.0 mg (2 pills)  . warfarin (COUMADIN) 5 MG tablet Take 2.5 mg (0.5 tablet) daily except 5 mg (1 tablet) on Mondays and Thursdays.  . [DISCONTINUED] furosemide (LASIX) 20 MG tablet Take  1 tablet (20 mg total) by mouth every morning.     REVIEW OF SYSTEMS  : All other systems reviewed and negative except where noted in the History of Present Illness.   PHYSICAL EXAM: BP 102/64 mmHg  Pulse 68  Ht 5\' 6"  (1.676 m)  Wt 198 lb 9.6 oz (90.084 kg)  BMI 32.07 kg/m2 General: Well developed male in no acute distress Head: Normocephalic and atraumatic Eyes:  Sclerae anicteric, conjunctiva pink. Ears: Normal auditory acuity Lungs: Clear throughout to auscultation Heart:  Irregular.  Murmur noted. Abdomen: Soft, non-distended.  Normal bowel sounds.  Non-tender. Rectal:  Will be done at the time of  colonoscopy. Musculoskeletal: Symmetrical with no gross deformities  Skin: No lesions on visible extremities Extremities: No edema  Neurological: Alert oriented x 4, grossly non-focal Psychological:  Alert and cooperative. Normal mood and affect  ASSESSMENT AND PLAN: -Screening colonoscopy:  Last colonoscopy was 10-15 years ago in [place], [place].  Will schedule with Dr. Fuller Plan.  The risks, benefits, and alternatives were discussed with the patient and he consents to proceed. -Atrial fibrillation on coumadin:  Will contact Dr. Lovena Le for ok to hold coumadin prior to procedure.  The risks benefits and alternatives to a temporary hold of anti-coagulants/anti-platelets for the procedure were discussed with the patient he consents to proceed. 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 -COPD -History of substance abuse:  Now on methadone.

## 2014-07-02 NOTE — Progress Notes (Signed)
Reviewed and agree with management plan.  Eustacio Ellen T. Bekka Qian, MD FACG 

## 2014-07-02 NOTE — Telephone Encounter (Signed)
  07/02/2014   RE: Carson Bogden DOB: Dec 09, 1945 MRN: [id number]   Dear Dr. Lovena Le,    We have scheduled the above patient for an Colonoscopy. Our records show that he is on anticoagulation therapy.   Please advise as to how long the patient may come off his therapy of Coumadin prior to the procedure, which is scheduled for 08-21-2014.  Please route the completed form to Evette Georges., CMA.   Sincerely,    Hope Pigeon

## 2014-07-03 ENCOUNTER — Ambulatory Visit (INDEPENDENT_AMBULATORY_CARE_PROVIDER_SITE_OTHER): Payer: Medicare Other | Admitting: Internal Medicine

## 2014-07-03 ENCOUNTER — Encounter: Payer: Self-pay | Admitting: Internal Medicine

## 2014-07-03 VITALS — BP 130/64 | HR 53 | Temp 97.8°F | Wt 197.5 lb

## 2014-07-03 DIAGNOSIS — I5189 Other ill-defined heart diseases: Secondary | ICD-10-CM

## 2014-07-03 DIAGNOSIS — Z0000 Encounter for general adult medical examination without abnormal findings: Secondary | ICD-10-CM

## 2014-07-03 DIAGNOSIS — I519 Heart disease, unspecified: Secondary | ICD-10-CM

## 2014-07-03 LAB — BASIC METABOLIC PANEL WITH GFR
BUN: 25 mg/dL — ABNORMAL HIGH (ref 6–23)
CALCIUM: 8.2 mg/dL — AB (ref 8.4–10.5)
CO2: 30 meq/L (ref 19–32)
CREATININE: 1.08 mg/dL (ref 0.50–1.35)
Chloride: 103 mEq/L (ref 96–112)
GFR, EST AFRICAN AMERICAN: 82 mL/min
GFR, Est Non African American: 71 mL/min
Glucose, Bld: 81 mg/dL (ref 70–99)
Potassium: 4.6 mEq/L (ref 3.5–5.3)
SODIUM: 141 meq/L (ref 135–145)

## 2014-07-03 MED ORDER — AMIODARONE HCL 200 MG PO TABS: 200.0000 mg | ORAL_TABLET | Freq: Every day | ORAL | Status: DC

## 2014-07-03 MED ORDER — FUROSEMIDE 20 MG PO TABS: 20.0000 mg | ORAL_TABLET | Freq: Every day | ORAL | Status: DC

## 2014-07-03 NOTE — Telephone Encounter (Signed)
Per Dr. Lovena Le okay to proceed with colonoscopy and may stop Warfarin 4-5 days prior to procedure

## 2014-07-03 NOTE — Assessment & Plan Note (Addendum)
Pt seen 11/24 and his Lasix was held 2/2 acute on chronic renal insufficiency. Today he states that he is doing well. His weight is 197.5 lbs, which is slightly up from his last clinic visit with [place] at 195.3lbs However, it is unchanged from his visit 05/24/14. Today he denies any increased SOB or significant peripheral edema since stopping the Lasix. No crackles on exam, but trace to 1+ pitting edema in BLE, and his weight is up 2 lbs from 11/24. - Checking BMP today - Restarting Lasix 20mg  daily - F/u with PCP in 1 mo for repeat BMP and to assess volume status

## 2014-07-03 NOTE — Assessment & Plan Note (Signed)
Colonoscopy scheduled for Feb

## 2014-07-03 NOTE — Patient Instructions (Addendum)
Begin taking the Lasix 20mg  daily. Return to the clinic in 1 month for repeat labs and to be evaluated.   Be sure to weigh yourself daily.  If you begin feeling short of breath or notice weight gain of 3 lbs overnight or 5 lbs in a week, please call the clinic or your Cardiologist.   General Instructions:   Please bring your medicines with you each time you come to clinic.  Medicines may include prescription medications, over-the-counter medications, herbal remedies, eye drops, vitamins, or other pills.   Progress Toward Treatment Goals:  Treatment Goal 05/24/2014  Blood pressure at goal  Stop smoking smoking less    Self Care Goals & Plans:  Self Care Goal 06/12/2014  Manage my medications take my medicines as prescribed; bring my medications to every visit; refill my medications on time; follow the sick day instructions if I am sick  Monitor my health -  Eat healthy foods eat more vegetables; eat fruit for snacks and desserts; eat baked foods instead of fried foods; eat foods that are low in salt; eat smaller portions  Be physically active find an activity I enjoy  Stop smoking -  Meeting treatment goals -    No flowsheet data found.   Care Management & Community Referrals:  Referral 05/24/2014  Referrals made for care management support none needed  Referrals made to community resources none

## 2014-07-03 NOTE — Progress Notes (Signed)
Patient ID: [name], male   DOB: [birth year], 68 y.o.   MRN: [id number]  Subjective:   Patient ID: [name] male   DOB: 14-Jul-1946 68 y.o.   MRN: [id number]  HPI: [name] is a 68 y.o. M w/ PMH of grade 1 diastolic dysfunction (ECHO 06/07/14), A. Fib on Coumadin, HTN, hx of polysubstance abuse on methadone presents for a f/u for volume status.  He was seen in the clinic 11/24 and at that time his weight was increased and he was c/o SOB, which he does have at baseline, His renal function had also worsened from Cr 1.2 to 1.3. He was asked to hold his Lasix and f/u in 1 month or PRN to have his volume status assessed. He was seen yesterday by GI to be set up for a screening colonoscopy and per their documentation he did not have any peripheral edema. Today he states that he has not had any significant peripheral edema since stopping the Lasix.  Pt still smoking 1-2 cigarettes a day.    Past Medical History  Diagnosis Date  . Cardiomyopathy     EF55% 11/14<<35%   . Substance abuse     alcohol  . Hypertension   . Atrial fibrillation /flutter     hx of flutter ablation 2/12  . Shortness of breath     "all the time lately" (12/14/2012)  . GERD (gastroesophageal reflux disease)   . Hepatitis     "think I had that once; a long time ago; from dirty needles I think" (12/14/2012)  . 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 Leukocytosis, unspecified 06/12/2013  . 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 Obesity   . Syncope   . Cancer   . CHF (congestive heart failure)   . Renal insufficiency    Current Outpatient Prescriptions  Medication Sig Dispense Refill  . amLODipine (NORVASC) 5 MG tablet Take 1 tablet (5 mg total) by mouth daily. 30 tablet 0  . Budesonide (PULMICORT FLEXHALER) 90 MCG/ACT inhaler Inhale 1 puff into the lungs every evening.    . carvedilol (COREG) 12.5 MG tablet Take 1 tablet (12.5 mg total) by mouth 2 (two) times daily with  a meal. 60 tablet 2  . hydroxyurea (HYDREA) 500 MG capsule take 3 capsules by mouth once daily *MAY TAKE WITH FOOD TO MINIMIZE GI SIDE EFFECTS 90 capsule 3  . methadone (DOLOPHINE) 10 MG/5ML solution Take 26-30 mg by mouth every morning.    . tiotropium (SPIRIVA HANDIHALER) 18 MCG inhalation capsule Place 1 capsule (18 mcg total) into inhaler and inhale daily. 30 capsule 12  . warfarin (COUMADIN) 5 MG tablet Take 2.5 mg (0.5 tablet) daily except 5 mg (1 tablet) on Mondays and Thursdays. 25 tablet 1  . albuterol (PROAIR HFA) 108 (90 BASE) MCG/ACT inhaler Inhale 1-2 puffs into the lungs daily as needed for wheezing or shortness of breath. (Patient not taking: Reported on 07/03/2014) 18 g 5  . amiodarone (PACERONE) 200 MG tablet Take 1 tablet (200 mg total) by mouth daily. (Patient not taking: Reported on 07/03/2014) 30 tablet 1  . furosemide (LASIX) 20 MG tablet Take 1 tablet (20 mg total) by mouth daily. 30 tablet 1  . MOVIPREP 100 G SOLR Take 1 kit (200 g total) by mouth once. (Patient not taking: Reported on 07/03/2014) 1 kit 0   No current facility-administered medications for this visit.   Family History  Problem Relation Age of Onset  . Diabetes Mother   . Hypertension  Mother   . Cirrhosis Father   . Alcohol abuse Father    History   Social History  . Marital Status: Single    Spouse Name: N/A    Number of Children: N/A  . Years of Education: N/A   Occupational History  . Retired    Social History Main Topics  . Smoking status: Current Every Day Smoker -- 0.20 packs/day for 40 years    Types: Cigarettes  . Smokeless tobacco: Never Used     Comment: patient smoked 1-2ppd x 50 years, now smoking approx 4-5 cigs per day x 1 year  . Alcohol Use: 0.0 oz/week    0 Not specified per week     Comment: drinks a beer once in a while for the past year, but has h/o heavy alcohol use for many years before this  . Drug Use: Yes    Special: "Crack" cocaine, Cocaine, Heroin     Comment:  03/20/2013 "been clean for awhile"; endorses using heroin "all my life" up until about a year ago  . Sexual Activity: Not Currently   Other Topics Concern  . Not on file   Social History Narrative   Moved here from [place]. Lives with common law wife and grandchildren. He is retired from "general labor."   Review of Systems: Constitutional: Denies fever, chills, or fatigue.  HEENT: Denies vision changes Respiratory: Denies SOB. +mild DOE, at baseline Cardiovascular: Denies chest pain, palpitations, + mild leg swelling.  Gastrointestinal: Denies nausea, vomiting, abdominal pain, diarrhea, constipation Genitourinary: Denies dysuria, urgency, frequency  Musculoskeletal: Denies myalgias or arthralgias or gait problem.  Skin: Denies rash or wound.  Neurological: Denies dizziness, syncope, weakness, light-headedness, numbness, or headaches.  Psychiatric/Behavioral: Denies suicidal ideation, mood changes, or confusion.  Objective:  Physical Exam: Filed Vitals:   07/03/14 0836  BP: 130/64  Pulse: 53  Temp: 97.8 F (36.6 C)  TempSrc: Oral  Weight: 197 lb 8 oz (89.585 kg)  SpO2: 98%   Constitutional: Vital signs reviewed.  Patient is a well-developed and well-nourished male in no acute distress and cooperative with exam. Alert and oriented x3.  Head: Normocephalic and atraumatic Eyes: PERRL, EOMI. No scleral icterus.  Cardiovascular: RRR, no MRG, pulses symmetric and intact bilaterally Pulmonary/Chest: Normal respiratory effort, +end expiratory wheezes, no crackles, rales, or rhonchi Abdominal: Soft. Non-tender, non-distended, bowel sounds are normal Musculoskeletal: No joint deformities. ROM full. Neurological: A&O x3, Strength is normal and symmetric bilaterally, cranial nerve II-XII are grossly intact, no focal motor deficit. Skin: Warm, dry and intact. Trace to 1+pitting edema in BLE.  Psychiatric: Normal mood and affect. Speech and behavior is normal.   Assessment & Plan:    Please refer to Problem List based Assessment and Plan

## 2014-07-04 NOTE — Telephone Encounter (Signed)
Patient called back  I advised patient that he can hold Coumadin 5 days prior to procedure per Dr. Lovena Le Patient verbalized understanding

## 2014-07-04 NOTE — Telephone Encounter (Signed)
Called patient left message with a male that answered the phone that my name is Claiborne Billings I' am calling from [facility] and it is very important he returns the call, I have information I need to tell him. Male stated when patient returns home she will pass the message.

## 2014-07-04 NOTE — Progress Notes (Signed)
Loop recorder 

## 2014-07-04 NOTE — Telephone Encounter (Signed)
Called patient could not leave a message no voice mail set up

## 2014-07-05 NOTE — Progress Notes (Signed)
Internal Medicine Clinic Attending  Case discussed with Dr. Glenn soon after the resident saw the patient.  We reviewed the resident's history and exam and pertinent patient test results.  I agree with the assessment, diagnosis, and plan of care documented in the resident's note. 

## 2014-07-06 ENCOUNTER — Other Ambulatory Visit: Payer: Self-pay | Admitting: Internal Medicine

## 2014-07-16 ENCOUNTER — Ambulatory Visit (INDEPENDENT_AMBULATORY_CARE_PROVIDER_SITE_OTHER): Payer: Medicare Other | Admitting: Pharmacist

## 2014-07-16 DIAGNOSIS — I4891 Unspecified atrial fibrillation: Secondary | ICD-10-CM

## 2014-07-16 LAB — POCT INR: INR: 2

## 2014-07-16 NOTE — Progress Notes (Signed)
Anti-Coagulation Progress Note  [name] is a 68 y.o. male who reports to the clinic for monitoring of anticoagulation treatment.    RECENT RESULTS: Recent results are below, the most recent result is correlated with a dose of 20 mg. per week: Lab Results  Component Value Date   INR 2.0 07/16/2014   INR 2.00 06/18/2014   INR 1.60* 06/08/2014    Weekly dose was unchanged  ANTI-COAG DOSE: INR as of 07/16/2014 and Previous Dosing Information    INR Dt INR Goal [name] [name]   07/16/2014 2.0 2.0-3.0 20 mg 2.5 mg 5 mg 2.5 mg 2.5 mg 2.5 mg 2.5 mg 2.5 mg    Anticoagulation Dose Instructions as of 07/16/2014      Total [name]   New Dose 20 mg 2.5 mg 5 mg 2.5 mg 2.5 mg 2.5 mg 2.5 mg 2.5 mg     (5 mg x 0.5)  (5 mg x 1)  (5 mg x 0.5)  (5 mg x 0.5)  (5 mg x 0.5)  (5 mg x 0.5)  (5 mg x 0.5)                           ANTICOAG SUMMARY: Anticoagulation Episode Summary    Current INR goal 2.0-3.0  Next INR check 08/13/2014  INR from last check 2.0 (07/16/2014)  Weekly max dose   Target end date Indefinite  INR check location [facility]  Preferred lab   Send INR reminders to    Indications  Atrial fibrillation [I48.91]        Comments        PATIENT INSTRUCTIONS: Patient Instructions  Patient instructed to take medications as defined in the Anti-coagulation Track section of this encounter.  Patient instructed to take today's dose.  Patient verbalized understanding of these instructions.       FOLLOW-UP 4 weeks, 08/13/14  Flossie Dibble, PharmD BCPS, BCACP

## 2014-07-16 NOTE — Patient Instructions (Signed)
Patient instructed to take medications as defined in the Anti-coagulation Track section of this encounter.  Patient instructed to take today's dose.  Patient verbalized understanding of these instructions.    

## 2014-07-17 ENCOUNTER — Encounter: Payer: Self-pay | Admitting: Internal Medicine

## 2014-07-24 ENCOUNTER — Encounter (HOSPITAL_COMMUNITY): Payer: Self-pay | Admitting: Emergency Medicine

## 2014-07-24 ENCOUNTER — Emergency Department (HOSPITAL_COMMUNITY)
Admission: EM | Admit: 2014-07-24 | Discharge: 2014-07-24 | Disposition: A | Discharge status: Home / Self Care | Payer: Medicare Other | Attending: Emergency Medicine | Admitting: Emergency Medicine

## 2014-07-24 DIAGNOSIS — I10 Essential (primary) hypertension: Secondary | ICD-10-CM | POA: Insufficient documentation

## 2014-07-24 DIAGNOSIS — Z79899 Other long term (current) drug therapy: Secondary | ICD-10-CM | POA: Insufficient documentation

## 2014-07-24 DIAGNOSIS — Z7951 Long term (current) use of inhaled steroids: Secondary | ICD-10-CM | POA: Diagnosis not present

## 2014-07-24 DIAGNOSIS — R112 Nausea with vomiting, unspecified: Secondary | ICD-10-CM | POA: Diagnosis not present

## 2014-07-24 DIAGNOSIS — Z85828 Personal history of other malignant neoplasm of skin: Secondary | ICD-10-CM | POA: Diagnosis not present

## 2014-07-24 DIAGNOSIS — Z8719 Personal history of other diseases of the digestive system: Secondary | ICD-10-CM | POA: Diagnosis not present

## 2014-07-24 DIAGNOSIS — Z720 Tobacco use: Secondary | ICD-10-CM | POA: Diagnosis not present

## 2014-07-24 DIAGNOSIS — I4891 Unspecified atrial fibrillation: Secondary | ICD-10-CM | POA: Diagnosis not present

## 2014-07-24 DIAGNOSIS — E669 Obesity, unspecified: Secondary | ICD-10-CM | POA: Diagnosis not present

## 2014-07-24 DIAGNOSIS — D751 Secondary polycythemia: Secondary | ICD-10-CM | POA: Insufficient documentation

## 2014-07-24 DIAGNOSIS — Z87448 Personal history of other diseases of urinary system: Secondary | ICD-10-CM | POA: Diagnosis not present

## 2014-07-24 DIAGNOSIS — I509 Heart failure, unspecified: Secondary | ICD-10-CM | POA: Diagnosis not present

## 2014-07-24 DIAGNOSIS — Z7901 Long term (current) use of anticoagulants: Secondary | ICD-10-CM | POA: Diagnosis not present

## 2014-07-24 LAB — COMPREHENSIVE METABOLIC PANEL
ALT: 14 U/L (ref 0–53)
AST: 23 U/L (ref 0–37)
Albumin: 3.8 g/dL (ref 3.5–5.2)
Alkaline Phosphatase: 79 U/L (ref 39–117)
Anion gap: 11 (ref 5–15)
BUN: 23 mg/dL (ref 6–23)
CO2: 24 mmol/L (ref 19–32)
CREATININE: 1.06 mg/dL (ref 0.50–1.35)
Calcium: 8.4 mg/dL (ref 8.4–10.5)
Chloride: 104 mEq/L (ref 96–112)
GFR, EST AFRICAN AMERICAN: 82 mL/min — AB (ref >90)
GFR, EST NON AFRICAN AMERICAN: 71 mL/min — AB (ref >90)
Glucose, Bld: 153 mg/dL — ABNORMAL HIGH (ref 70–99)
Potassium: 4 mmol/L (ref 3.5–5.1)
Sodium: 139 mmol/L (ref 135–145)
TOTAL PROTEIN: 7.1 g/dL (ref 6.0–8.3)
Total Bilirubin: 0.7 mg/dL (ref 0.3–1.2)

## 2014-07-24 LAB — CBC WITH DIFFERENTIAL/PLATELET
Basophils Absolute: 0 10*3/uL (ref 0.0–0.1)
Basophils Relative: 0 % (ref 0–1)
EOS ABS: 0.1 10*3/uL (ref 0.0–0.7)
Eosinophils Relative: 1 % (ref 0–5)
HCT: 43.3 % (ref 39.0–52.0)
Hemoglobin: 14.6 g/dL (ref 13.0–17.0)
Lymphocytes Relative: 5 % — ABNORMAL LOW (ref 12–46)
Lymphs Abs: 0.3 10*3/uL — ABNORMAL LOW (ref 0.7–4.0)
MCH: 40.7 pg — ABNORMAL HIGH (ref 26.0–34.0)
MCHC: 33.7 g/dL (ref 30.0–36.0)
MCV: 120.6 fL — ABNORMAL HIGH (ref 78.0–100.0)
MONO ABS: 0.2 10*3/uL (ref 0.1–1.0)
Monocytes Relative: 3 % (ref 3–12)
NEUTROS ABS: 4.5 10*3/uL (ref 1.7–7.7)
NEUTROS PCT: 91 % — AB (ref 43–77)
Platelets: 100 10*3/uL — ABNORMAL LOW (ref 150–400)
RBC: 3.59 MIL/uL — ABNORMAL LOW (ref 4.22–5.81)
RDW: 14.4 % (ref 11.5–15.5)
WBC: 5.1 10*3/uL (ref 4.0–10.5)

## 2014-07-24 MED ORDER — ONDANSETRON 4 MG PO TBDP: 4.0000 mg | ORAL_TABLET | Freq: Three times a day (TID) | ORAL | Status: DC | PRN

## 2014-07-24 MED ORDER — ONDANSETRON 4 MG PO TBDP
4.0000 mg | ORAL_TABLET | Freq: Once | ORAL | Status: AC
  Administered 2014-07-24: 4 mg via ORAL
  Filled 2014-07-24: qty 1

## 2014-07-24 NOTE — ED Provider Notes (Signed)
CSN: [id number]     Arrival date & time 07/24/14  2023 History   First MD Initiated Contact with Patient 07/24/14 2059     Chief Complaint  Patient presents with  . Emesis     (Consider location/radiation/quality/duration/timing/severity/associated sxs/prior Treatment) Patient is a 69 y.o. male presenting with vomiting. The history is provided by the patient and medical records.  Emesis   This is a 69 year old male with past medical history significant for hypertension, congestive heart failure, cardiomyopathy, atrial fibrillation status post ablation, renal insufficiency, presenting to the ED for nausea and vomiting. He states his grandchildren at home are sick with similar symptoms. He denies any abdominal pain. Bowel movements have been normal, no melena, hematochezia, or diarrhea. He is freely passing flatus. No fever or chills. No urinary symptoms.  No prior abdominal surgeries.  Patient has been able to drink root beer today, states he has not tried to eat solid food yet.  Last emesis on the way to the ED.  Patient currently drinking water at time of my evaluation.  Past Medical History  Diagnosis Date  . Cardiomyopathy     EF55% 11/14<<35%   . Substance abuse     alcohol  . Hypertension   . Atrial fibrillation /flutter     hx of flutter ablation 2/12  . Shortness of breath     "all the time lately" (12/14/2012)  . GERD (gastroesophageal reflux disease)   . Hepatitis     "think I had that once; a long time ago; from dirty needles I think" (12/14/2012)  . 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 Leukocytosis, unspecified 06/12/2013  . 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 Obesity   . Syncope   . Cancer   . CHF (congestive heart failure)   . Renal insufficiency    Past Surgical History  Procedure Laterality Date  . Hemorrhoid surgery  1970's  . Cardiac electrophysiology mapping and ablation  08/2010    [name] 09/07/2010 (12/14/2012)  . Loop recorder implant  08-22-2013    MDT  LinQ implanted by Dr Caryl Comes for syncope  . Loop recorder implant N/A 08/23/2013    Procedure: LOOP RECORDER IMPLANT;  Surgeon: Deboraha Sprang, MD;  Location: [facility] CATH LAB;  Service: Cardiovascular;  Laterality: N/A;   Family History  Problem Relation Age of Onset  . Diabetes Mother   . Hypertension Mother   . Cirrhosis Father   . Alcohol abuse Father    History  Substance Use Topics  . Smoking status: Current Every Day Smoker -- 0.20 packs/day for 40 years    Types: Cigarettes  . Smokeless tobacco: Never Used     Comment: patient smoked 1-2ppd x 50 years, now smoking approx 4-5 cigs per day x 1 year  . Alcohol Use: 0.0 oz/week    0 Not specified per week     Comment: drinks a beer once in a while for the past year, but has h/o heavy alcohol use for many years before this    Review of Systems  Gastrointestinal: Positive for nausea and vomiting.  All other systems reviewed and are negative.     Allergies  Codeine  Home Medications   Prior to Admission medications   Medication Sig Start Date End Date Taking? Authorizing Provider  albuterol (PROAIR HFA) 108 (90 BASE) MCG/ACT inhaler Inhale 1-2 puffs into the lungs daily as needed for wheezing or shortness of breath. Patient not taking: Reported on 07/03/2014 05/28/14   Cresenciano Genre, MD  amiodarone [facility])  200 MG tablet Take 1 tablet (200 mg total) by mouth daily. Patient not taking: Reported on 07/03/2014 07/03/14   Otho Bellows, MD  amLODipine (NORVASC) 5 MG tablet take 1 tablet by mouth once daily 07/06/14   Cresenciano Genre, MD  Budesonide (PULMICORT FLEXHALER) 90 MCG/ACT inhaler Inhale 1 puff into the lungs every evening.    Historical Provider, MD  carvedilol (COREG) 12.5 MG tablet Take 1 tablet (12.5 mg total) by mouth 2 (two) times daily with a meal. 04/02/14   Cresenciano Genre, MD  furosemide (LASIX) 20 MG tablet Take 1 tablet (20 mg total) by mouth daily. 07/03/14 07/03/15  Otho Bellows, MD  hydroxyurea (HYDREA) 500  MG capsule take 3 capsules by mouth once daily *MAY TAKE WITH FOOD TO MINIMIZE GI SIDE EFFECTS 03/22/14   Heath Lark, MD  methadone (DOLOPHINE) 10 MG/5ML solution Take 26-30 mg by mouth every morning.    Historical Provider, MD  MOVIPREP 100 G SOLR Take 1 kit (200 g total) by mouth once. Patient not taking: Reported on 07/03/2014 07/02/14   Janett Billow D. Zehr, PA-C  tiotropium (SPIRIVA HANDIHALER) 18 MCG inhalation capsule Place 1 capsule (18 mcg total) into inhaler and inhale daily. 05/03/14   Kinnie Feil, MD  warfarin (COUMADIN) 5 MG tablet Take 2.5 mg (0.5 tablet) daily except 5 mg (1 tablet) on Mondays and Thursdays. 06/18/14   Bartholomew Crews, MD   BP 159/82 mmHg  Pulse 77  Temp(Src) 97.3 F (36.3 C) (Oral)  Resp 20  Ht $R'5\' 6"'EG$  (1.676 m)  Wt 190 lb (86.183 kg)  BMI 30.68 kg/m2  SpO2 97%   Physical Exam  Constitutional: He is oriented to person, place, and time. He appears well-developed and well-nourished. No distress.  Sitting in bed drinking water, no acute distress  HENT:  Head: Normocephalic and atraumatic.  Mouth/Throat: Oropharynx is clear and moist.  Moist mucous membranes  Eyes: Conjunctivae and EOM are normal. Pupils are equal, round, and reactive to light.  Neck: Normal range of motion. Neck supple.  Cardiovascular: Normal rate, regular rhythm and normal heart sounds.   Pulmonary/Chest: Effort normal and breath sounds normal. No respiratory distress. He has no wheezes.  Abdominal: Soft. Bowel sounds are normal. There is no tenderness. There is no guarding and no CVA tenderness.  Abdomen soft, nondistended, no focal tenderness or peritoneal signs  Musculoskeletal: Normal range of motion.  Neurological: He is alert and oriented to person, place, and time.  Skin: Skin is warm and dry. He is not diaphoretic.  Psychiatric: He has a normal mood and affect.  Nursing note and vitals reviewed.   ED Course  Procedures (including critical care time) Labs Review Labs  Reviewed  CBC WITH DIFFERENTIAL - Abnormal; Notable for the following:    RBC 3.59 (*)    MCV 120.6 (*)    MCH 40.7 (*)    Platelets 100 (*)    Neutrophils Relative % 91 (*)    Lymphocytes Relative 5 (*)    Lymphs Abs 0.3 (*)    All other components within normal limits  COMPREHENSIVE METABOLIC PANEL - Abnormal; Notable for the following:    Glucose, Bld 153 (*)    GFR calc non Af Amer 71 (*)    GFR calc Af Amer 82 (*)    All other components within normal limits    Imaging Review No results found.   EKG Interpretation None      MDM   Final diagnoses:  Nausea and vomiting, vomiting of unspecified type   69 year old male with nausea and vomiting for the past 24 hours. Sick contacts at home with similar symptoms. No chest pain or SOB.  On exam, patient afebrile and nontoxic in appearance. His abdominal exam is benign. He has had normal bowel movements and is freely passing flatus. He does not appear overly dehydrated and is currently drinking water. He was given dose of Zofran with resolution of his nausea. He has continued tolerating PO fluids without recurrent symptoms. Lab work reassuring. Abdominal exam remains benign. Low suspicion for acute/surgical abdomen at this time including, but not limited to, cholecystitis, appendicitis, small bowel obstruction, bowel perforation, diverticulitis, etc.  Patient be discharged home with Zofran, encouraged gentle diet and progress back to normal as tolerated. Patient will follow-up with his primary care physician.  Discussed plan with patient, he/she acknowledged understanding and agreed with plan of care.  Return precautions given for new or worsening symptoms.  Larene Pickett, PA-C 07/24/14 [place], MD 07/24/14 [phone]

## 2014-07-24 NOTE — ED Notes (Signed)
Pt reports nv today. sts family at home are vomiting as well. Denies abdominal pain or diarrhea.

## 2014-07-24 NOTE — Discharge Instructions (Signed)
Take the prescribed medication as directed. Recommend gentle/bland diet for now and progress back to normal as tolerated. Follow-up with your primary care physician. Return to the ED for new or worsening symptoms.

## 2014-07-24 NOTE — ED Notes (Signed)
Pt reports emesis since last night, denies abdominal pain, chest pain, sob, or diarrhea. Pt also denies blood in emesis. Pt last bm yesterday wnl.  Pt alert and oriented.

## 2014-07-25 ENCOUNTER — Encounter: Payer: Self-pay | Admitting: Hematology and Oncology

## 2014-07-25 ENCOUNTER — Other Ambulatory Visit (HOSPITAL_BASED_OUTPATIENT_CLINIC_OR_DEPARTMENT_OTHER): Payer: Medicare Other

## 2014-07-25 ENCOUNTER — Ambulatory Visit (HOSPITAL_BASED_OUTPATIENT_CLINIC_OR_DEPARTMENT_OTHER): Payer: Medicare Other | Admitting: Hematology and Oncology

## 2014-07-25 ENCOUNTER — Telehealth: Payer: Self-pay | Admitting: Hematology and Oncology

## 2014-07-25 VITALS — BP 106/60 | HR 56 | Temp 98.0°F | Resp 19 | Ht 66.0 in | Wt 204.8 lb

## 2014-07-25 DIAGNOSIS — Z7901 Long term (current) use of anticoagulants: Secondary | ICD-10-CM

## 2014-07-25 DIAGNOSIS — Z720 Tobacco use: Secondary | ICD-10-CM

## 2014-07-25 DIAGNOSIS — T50905A Adverse effect of unspecified drugs, medicaments and biological substances, initial encounter: Secondary | ICD-10-CM

## 2014-07-25 DIAGNOSIS — D471 Chronic myeloproliferative disease: Secondary | ICD-10-CM

## 2014-07-25 DIAGNOSIS — D45 Polycythemia vera: Secondary | ICD-10-CM

## 2014-07-25 DIAGNOSIS — D6959 Other secondary thrombocytopenia: Secondary | ICD-10-CM

## 2014-07-25 LAB — CBC WITH DIFFERENTIAL/PLATELET
BASO%: 0.3 % (ref 0.0–2.0)
Basophils Absolute: 0 10*3/uL (ref 0.0–0.1)
EOS ABS: 0 10*3/uL (ref 0.0–0.5)
EOS%: 0.6 % (ref 0.0–7.0)
HCT: 42 % (ref 38.4–49.9)
HEMOGLOBIN: 13.6 g/dL (ref 13.0–17.1)
LYMPH%: 6.8 % — ABNORMAL LOW (ref 14.0–49.0)
MCH: 38.8 pg — ABNORMAL HIGH (ref 27.2–33.4)
MCHC: 32.2 g/dL (ref 32.0–36.0)
MCV: 120.2 fL — ABNORMAL HIGH (ref 79.3–98.0)
MONO#: 0.5 10*3/uL (ref 0.1–0.9)
MONO%: 8.9 % (ref 0.0–14.0)
NEUT%: 83.4 % — AB (ref 39.0–75.0)
NEUTROS ABS: 4.2 10*3/uL (ref 1.5–6.5)
Platelets: 97 10*3/uL — ABNORMAL LOW (ref 140–400)
RBC: 3.5 10*6/uL — ABNORMAL LOW (ref 4.20–5.82)
RDW: 14.6 % (ref 11.0–14.6)
WBC: 5.1 10*3/uL (ref 4.0–10.3)
lymph#: 0.3 10*3/uL — ABNORMAL LOW (ref 0.9–3.3)

## 2014-07-25 NOTE — Assessment & Plan Note (Signed)
This is likely due to recent treatment and possible side-effects of recent addition of amiodarone. The patient denies recent history of bleeding such as epistaxis, hematuria or hematochezia. He is asymptomatic from the low platelet count. I will observe for now.  he does not require transfusion now. I will continue the chemotherapy at current dose without dosage adjustment.  If the thrombocytopenia gets progressive worse in the future, I might have to adjust the chemotherapy dose.

## 2014-07-25 NOTE — Assessment & Plan Note (Signed)
I spent some time counseling the patient the importance of tobacco cessation. he is currently attempting to quit on his own 

## 2014-07-25 NOTE — Assessment & Plan Note (Signed)
The patient has JAK2 mutation positive myeloproliferative disorder. He tolerated the hydroxyurea well. Unfortunately, he is now developing thrombocytopenia. I plan to recheck his labs again in 1 month

## 2014-07-25 NOTE — Progress Notes (Signed)
[name] OFFICE PROGRESS NOTE  Patient Care Team: Cresenciano Genre, MD as PCP - General (Internal Medicine) Heath Lark, MD as Consulting Physician (Hematology and Oncology)  SUMMARY OF ONCOLOGIC HISTORY:  This is a pleasant gentleman who is being referred here because of high hemoglobin level. In November 2014, we removed one unit of blood and order an additional workup to rule out myeloproliferative disorder. Blood test result confirmed low erythropoietin level along with detectable JAK 2 mutation, confirmed myeloproliferative disorder Unfortunately, bone marrow biopsy on 06/26/2013 was nondiagnostic On 07/31/13 the patient was started on 1 hydroxyurea a day On 08/15/2013, increased hydroxyurea to 2 tablets a day and remove one more unit of blood due to the high hemoglobin On 09/05/2013, I increased hydroxyurea to 3 tablets a day and remove one more unit of blood due to high hemoglobin and hematocrit On 01/23/2014, dose of hydroxyurea is reduced to 2 tablets a day due to anemia.  INTERVAL HISTORY: Please see below for problem oriented charting. He was recently seen in the ED for nausea & vomiting, resolved. The patient denies any recent signs or symptoms of bleeding such as spontaneous epistaxis, hematuria or hematochezia. He has new medications added since I saw him.  REVIEW OF SYSTEMS:   Constitutional: Denies fevers, chills or abnormal weight loss Eyes: Denies blurriness of vision Ears, nose, mouth, throat, and face: Denies mucositis or sore throat Respiratory: Denies cough, dyspnea or wheezes Cardiovascular: Denies palpitation, chest discomfort or lower extremity swelling Skin: Denies abnormal skin rashes Lymphatics: Denies new lymphadenopathy or easy bruising Neurological:Denies numbness, tingling or new weaknesses Behavioral/Psych: Mood is stable, no new changes  All other systems were reviewed with the patient and are negative.  I have reviewed the past  medical history, past surgical history, social history and family history with the patient and they are unchanged from previous note.  ALLERGIES:  is allergic to codeine.  MEDICATIONS:  Current Outpatient Prescriptions  Medication Sig Dispense Refill  . albuterol (PROAIR HFA) 108 (90 BASE) MCG/ACT inhaler Inhale 1-2 puffs into the lungs daily as needed for wheezing or shortness of breath. 18 g 5  . amiodarone (PACERONE) 200 MG tablet Take 1 tablet (200 mg total) by mouth daily. 30 tablet 1  . amLODipine (NORVASC) 5 MG tablet take 1 tablet by mouth once daily 30 tablet 2  . Budesonide (PULMICORT FLEXHALER) 90 MCG/ACT inhaler Inhale 1 puff into the lungs every evening.    . carvedilol (COREG) 12.5 MG tablet Take 1 tablet (12.5 mg total) by mouth 2 (two) times daily with a meal. 60 tablet 2  . furosemide (LASIX) 20 MG tablet Take 1 tablet (20 mg total) by mouth daily. 30 tablet 1  . hydroxyurea (HYDREA) 500 MG capsule take 3 capsules by mouth once daily *MAY TAKE WITH FOOD TO MINIMIZE GI SIDE EFFECTS 90 capsule 3  . methadone (DOLOPHINE) 10 MG/5ML solution Take 37 mg by mouth every morning.     . ondansetron (ZOFRAN ODT) 4 MG disintegrating tablet Take 1 tablet (4 mg total) by mouth every 8 (eight) hours as needed for nausea. 10 tablet 0  . tiotropium (SPIRIVA HANDIHALER) 18 MCG inhalation capsule Place 1 capsule (18 mcg total) into inhaler and inhale daily. 30 capsule 12  . warfarin (COUMADIN) 5 MG tablet Take 2.5 mg (0.5 tablet) daily except 5 mg (1 tablet) on Mondays and Thursdays. 25 tablet 1  . MOVIPREP 100 G SOLR Take 1 kit (200 g total) by mouth once. (Patient  not taking: Reported on 07/25/2014) 1 kit 0   No current facility-administered medications for this visit.    PHYSICAL EXAMINATION: ECOG PERFORMANCE STATUS: 0 - Asymptomatic  Filed Vitals:   07/25/14 0848  BP: 106/60  Pulse: 56  Temp: 98 F (36.7 C)  Resp: 19   Filed Weights   07/25/14 0848  Weight: 204 lb 12.8 oz (92.897  kg)    GENERAL:alert, no distress and comfortable. He is morbidly obese SKIN: skin color, texture, turgor are normal, no rashes or significant lesions EYES: normal, Conjunctiva are pink and non-injected, sclera clear OROPHARYNX:no exudate, no erythema and lips, buccal mucosa, and tongue normal  NECK: supple, thyroid normal size, non-tender, without nodularity LYMPH:  no palpable lymphadenopathy in the cervical, axillary or inguinal LUNGS: clear to auscultation and percussion with normal breathing effort HEART: regular rate & rhythm and no murmurs and no lower extremity edema ABDOMEN:abdomen soft, non-tender and normal bowel sounds Musculoskeletal:no cyanosis of digits and no clubbing  NEURO: alert & oriented x 3 with fluent speech, no focal motor/sensory deficits  LABORATORY DATA:  I have reviewed the data as listed    Component Value Date/Time   NA 139 07/24/2014 2044   NA 143 06/12/2013 1125   K 4.0 07/24/2014 2044   K 3.7 06/12/2013 1125   CL 104 07/24/2014 2044   CO2 24 07/24/2014 2044   CO2 32* 06/12/2013 1125   GLUCOSE 153* 07/24/2014 2044   GLUCOSE 92 06/12/2013 1125   BUN 23 07/24/2014 2044   BUN 14.6 06/12/2013 1125   CREATININE 1.06 07/24/2014 2044   CREATININE 1.08 07/03/2014 0858   CREATININE 0.9 06/12/2013 1125   CALCIUM 8.4 07/24/2014 2044   CALCIUM 8.9 06/12/2013 1125   PROT 7.1 07/24/2014 2044   PROT 6.9 06/12/2013 1125   ALBUMIN 3.8 07/24/2014 2044   ALBUMIN 3.3* 06/12/2013 1125   AST 23 07/24/2014 2044   AST 21 06/12/2013 1125   ALT 14 07/24/2014 2044   ALT 28 06/12/2013 1125   ALKPHOS 79 07/24/2014 2044   ALKPHOS 112 06/12/2013 1125   BILITOT 0.7 07/24/2014 2044   BILITOT 0.82 06/12/2013 1125   GFRNONAA 71* 07/24/2014 2044   GFRNONAA 71 07/03/2014 0858   GFRAA 82* 07/24/2014 2044   GFRAA 82 07/03/2014 0858    No results found for: SPEP, UPEP  Lab Results  Component Value Date   WBC 5.1 07/25/2014   NEUTROABS 4.2 07/25/2014   HGB 13.6  07/25/2014   HCT 42.0 07/25/2014   MCV 120.2* 07/25/2014   PLT 97* 07/25/2014      Chemistry      Component Value Date/Time   NA 139 07/24/2014 2044   NA 143 06/12/2013 1125   K 4.0 07/24/2014 2044   K 3.7 06/12/2013 1125   CL 104 07/24/2014 2044   CO2 24 07/24/2014 2044   CO2 32* 06/12/2013 1125   BUN 23 07/24/2014 2044   BUN 14.6 06/12/2013 1125   CREATININE 1.06 07/24/2014 2044   CREATININE 1.08 07/03/2014 0858   CREATININE 0.9 06/12/2013 1125      Component Value Date/Time   CALCIUM 8.4 07/24/2014 2044   CALCIUM 8.9 06/12/2013 1125   ALKPHOS 79 07/24/2014 2044   ALKPHOS 112 06/12/2013 1125   AST 23 07/24/2014 2044   AST 21 06/12/2013 1125   ALT 14 07/24/2014 2044   ALT 28 06/12/2013 1125   BILITOT 0.7 07/24/2014 2044   BILITOT 0.82 06/12/2013 1125    ASSESSMENT & PLAN:  Polycythemia  vera The patient has JAK2 mutation positive myeloproliferative disorder. He tolerated the hydroxyurea well. Unfortunately, he is now developing thrombocytopenia. I plan to recheck his labs again in 1 month   Thrombocytopenia due to drugs This is likely due to recent treatment and possible side-effects of recent addition of amiodarone. The patient denies recent history of bleeding such as epistaxis, hematuria or hematochezia. He is asymptomatic from the low platelet count. I will observe for now.  he does not require transfusion now. I will continue the chemotherapy at current dose without dosage adjustment.  If the thrombocytopenia gets progressive worse in the future, I might have to adjust the chemotherapy dose.    Chronic anticoagulation The patient denies any recent signs or symptoms of bleeding such as spontaneous epistaxis, hematuria or hematochezia. He is at risk of bleeding due to recent thrombocytopenia. I will monitor his blood counts carefully and bring him back to recheck labs 1 month from now  Tobacco abuse I spent some time counseling the patient the importance of  tobacco cessation. he is currently attempting to quit on his own   No orders of the defined types were placed in this encounter.   All questions were answered. The patient knows to call the clinic with any problems, questions or concerns. No barriers to learning was detected. I spent 25 minutes counseling the patient face to face. The total time spent in the appointment was 30 minutes and more than 50% was on counseling and review of test results     [facility], [place], MD 07/25/2014 6:49 PM

## 2014-07-25 NOTE — Assessment & Plan Note (Signed)
The patient denies any recent signs or symptoms of bleeding such as spontaneous epistaxis, hematuria or hematochezia. He is at risk of bleeding due to recent thrombocytopenia. I will monitor his blood counts carefully and bring him back to recheck labs 1 month from now

## 2014-07-25 NOTE — Telephone Encounter (Signed)
Gave avs & cal for Feb. °

## 2014-07-30 ENCOUNTER — Ambulatory Visit (INDEPENDENT_AMBULATORY_CARE_PROVIDER_SITE_OTHER): Payer: Medicare Other | Admitting: *Deleted

## 2014-07-30 DIAGNOSIS — R55 Syncope and collapse: Secondary | ICD-10-CM

## 2014-08-03 NOTE — Progress Notes (Signed)
Loop recorder 

## 2014-08-07 ENCOUNTER — Encounter: Payer: Self-pay | Admitting: Internal Medicine

## 2014-08-07 ENCOUNTER — Ambulatory Visit (INDEPENDENT_AMBULATORY_CARE_PROVIDER_SITE_OTHER): Payer: Medicare Other | Admitting: Internal Medicine

## 2014-08-07 VITALS — BP 118/66 | HR 55 | Ht 66.0 in | Wt 209.4 lb

## 2014-08-07 DIAGNOSIS — R55 Syncope and collapse: Secondary | ICD-10-CM

## 2014-08-07 DIAGNOSIS — I480 Paroxysmal atrial fibrillation: Secondary | ICD-10-CM

## 2014-08-07 DIAGNOSIS — I5022 Chronic systolic (congestive) heart failure: Secondary | ICD-10-CM

## 2014-08-07 LAB — MDC_IDC_ENUM_SESS_TYPE_INCLINIC
MDC IDC SESS DTM: 20160119091344
Zone Setting Detection Interval: 2000 ms
Zone Setting Detection Interval: 3000 ms
Zone Setting Detection Interval: 370 ms

## 2014-08-07 NOTE — Assessment & Plan Note (Signed)
Interrogation of his ILR demonstrates brief atrial fib, but no pauses or rapid heart beats. Watchful waiting

## 2014-08-07 NOTE — Assessment & Plan Note (Signed)
His LV function has been variable. I suspect it may be rate related. He will continue his current meds.

## 2014-08-07 NOTE — Progress Notes (Signed)
HPI [name] returns today for on going evaluation of atrial fibrillation and LV dysfunction. He has a h/o syncope and is s/p ILR.   He denies syncope. No chest pain. No edema. No syncope. He has had no palpitations. No trouble taking medications. Allergies  Allergen Reactions  . Codeine Rash     Current Outpatient Prescriptions  Medication Sig Dispense Refill  . albuterol (PROAIR HFA) 108 (90 BASE) MCG/ACT inhaler Inhale 1-2 puffs into the lungs daily as needed for wheezing or shortness of breath. 18 g 5  . amiodarone (PACERONE) 200 MG tablet Take 1 tablet (200 mg total) by mouth daily. 30 tablet 1  . amLODipine (NORVASC) 5 MG tablet take 1 tablet by mouth once daily 30 tablet 2  . Budesonide (PULMICORT FLEXHALER) 90 MCG/ACT inhaler Inhale 1 puff into the lungs every evening.    . carvedilol (COREG) 12.5 MG tablet Take 1 tablet (12.5 mg total) by mouth 2 (two) times daily with a meal. 60 tablet 2  . furosemide (LASIX) 20 MG tablet Take 1 tablet (20 mg total) by mouth daily. 30 tablet 1  . hydroxyurea (HYDREA) 500 MG capsule take 3 capsules by mouth once daily *MAY TAKE WITH FOOD TO MINIMIZE GI SIDE EFFECTS 90 capsule 3  . methadone (DOLOPHINE) 10 MG/5ML solution Take 40 mg by mouth every morning.     [name] MOVIPREP 100 G SOLR Take 1 kit (200 g total) by mouth once. 1 kit 0  . ondansetron (ZOFRAN ODT) 4 MG disintegrating tablet Take 1 tablet (4 mg total) by mouth every 8 (eight) hours as needed for nausea. 10 tablet 0  . tiotropium (SPIRIVA HANDIHALER) 18 MCG inhalation capsule Place 1 capsule (18 mcg total) into inhaler and inhale daily. 30 capsule 12  . warfarin (COUMADIN) 5 MG tablet Take 2.5 mg (0.5 tablet) daily except 5 mg (1 tablet) on Mondays and Thursdays. 25 tablet 1   No current facility-administered medications for this visit.     Past Medical History  Diagnosis Date  . Cardiomyopathy     EF55% 11/14<<35%   . Substance abuse     alcohol  . Hypertension   . Atrial  fibrillation /flutter     hx of flutter ablation 2/12  . Shortness of breath     "all the time lately" (12/14/2012)  . GERD (gastroesophageal reflux disease)   . Hepatitis     "think I had that once; a long time ago; from dirty needles I think" (12/14/2012)  . 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 Leukocytosis, unspecified 06/12/2013  . 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 Obesity   . Syncope   . Cancer   . CHF (congestive heart failure)   . Renal insufficiency     ROS:   All systems reviewed and negative except as noted in the HPI.   Past Surgical History  Procedure Laterality Date  . Hemorrhoid surgery  1970's  . Cardiac electrophysiology mapping and ablation  08/2010    [name] 09/07/2010 (12/14/2012)  . Loop recorder implant  08-22-2013    MDT LinQ implanted by Dr Caryl Comes for syncope  . Loop recorder implant N/A 08/23/2013    Procedure: LOOP RECORDER IMPLANT;  Surgeon: Deboraha Sprang, MD;  Location: [facility] CATH LAB;  Service: Cardiovascular;  Laterality: N/A;     Family History  Problem Relation Age of Onset  . Diabetes Mother   . Hypertension Mother   . Cirrhosis Father   . Alcohol abuse Father  History   Social History  . Marital Status: Single    Spouse Name: N/A    Number of Children: N/A  . Years of Education: N/A   Occupational History  . Retired    Social History Main Topics  . Smoking status: Current Every Day Smoker -- 0.20 packs/day for 40 years    Types: Cigarettes  . Smokeless tobacco: Never Used     Comment: patient smoked 1-2ppd x 50 years, now smoking approx 4-5 cigs per day x 1 year  . Alcohol Use: 0.0 oz/week    0 Not specified per week     Comment: drinks a beer once in a while for the past year, but has h/o heavy alcohol use for many years before this  . Drug Use: Yes    Special: "Crack" cocaine, Cocaine, Heroin     Comment: 03/20/2013 "been clean for awhile"; endorses using heroin "all my life" up until about a year ago  . Sexual  Activity: Not Currently   Other Topics Concern  . Not on file   Social History Narrative   Moved here from [place]. Lives with common law wife and grandchildren. He is retired from "general labor."     BP 118/66 mmHg  Pulse 55  Ht $R'5\' 6"'Wl$  (1.676 m)  Wt 209 lb 6.4 oz (94.983 kg)  BMI 33.81 kg/m2  SpO2 94%  Physical Exam:  stable appearing 69 yo man, NAD HEENT: Unremarkable Neck:  6 cm JVD, no thyromegally Lungs:  Clear with no wheezes, rales, or rhonchi HEART:  Regular rate rhythm, no murmurs, no rubs, no clicks Abd:  soft, positive bowel sounds, no organomegally, no rebound, no guarding Ext:  2 plus pulses, no edema, no cyanosis, no clubbing Skin:  No rashes no nodules Neuro:  CN II through XII intact, motor grossly intact   Assess/Plan:

## 2014-08-07 NOTE — Assessment & Plan Note (Signed)
He will undergo watchful waiting an continue amio and warfarin.

## 2014-08-07 NOTE — Patient Instructions (Signed)
Your physician wants you to follow-up in: Dr. Knox Saliva will receive a reminder letter in the mail two months in advance. If you don't receive a letter, please call our office to schedule the follow-up appointment.   Your physician recommends that you continue on your current medications as directed. Please refer to the Current Medication list given to you today.

## 2014-08-11 LAB — MDC_IDC_ENUM_SESS_TYPE_REMOTE: MDC IDC SESS DTM: 20160102050500

## 2014-08-13 ENCOUNTER — Ambulatory Visit (INDEPENDENT_AMBULATORY_CARE_PROVIDER_SITE_OTHER): Payer: Medicare Other | Admitting: Pharmacist

## 2014-08-13 DIAGNOSIS — Z7901 Long term (current) use of anticoagulants: Secondary | ICD-10-CM

## 2014-08-13 DIAGNOSIS — I4891 Unspecified atrial fibrillation: Secondary | ICD-10-CM

## 2014-08-13 LAB — POCT INR: INR: 2.1

## 2014-08-13 NOTE — Progress Notes (Signed)
Anti-Coagulation Progress Note  [name] is a 69 y.o. male who is currently on an anti-coagulation regimen.    RECENT RESULTS: Recent results are below, the most recent result is correlated with a dose of 20 mg. per week: Lab Results  Component Value Date   INR 2.10 08/13/2014   INR 2.0 07/16/2014   INR 2.00 06/18/2014    ANTI-COAG DOSE: Anticoagulation Dose Instructions as of 08/13/2014      [name]   New Dose 2.5 mg 5 mg 5 mg 5 mg 5 mg 2.5 mg 2.5 mg       ANTICOAG SUMMARY: Anticoagulation Episode Summary    Current INR goal 2.0-3.0  Next INR check 08/27/2014  INR from last check 2.10 (08/13/2014)  Weekly max dose   Target end date Indefinite  [facility]  Preferred lab   Send INR reminders to    Indications  Atrial fibrillation [I48.91]        Comments         ANTICOAG TODAY: Anticoagulation Summary as of 08/13/2014    INR goal 2.0-3.0  Selected INR 2.10 (08/13/2014)  Next INR check 08/27/2014  Target end date Indefinite   Indications  Atrial fibrillation [I48.91]      Anticoagulation Episode Summary    [facility] check [facility]   Preferred lab    Send INR reminders to    Comments       PATIENT INSTRUCTIONS: Patient Instructions  Patient instructed to take medications as defined in the Anti-coagulation Track section of this encounter.  Patient instructed to take today's dose.  Patient verbalized understanding of these instructions.       FOLLOW-UP Return in 2 weeks (on 08/27/2014) for Follow up INR at 0945h.  [name], III Pharm.D., CACP

## 2014-08-13 NOTE — Patient Instructions (Signed)
Patient instructed to take medications as defined in the Anti-coagulation Track section of this encounter.  Patient instructed to take today's dose.  Patient verbalized understanding of these instructions.    

## 2014-08-16 ENCOUNTER — Encounter: Payer: Self-pay | Admitting: Internal Medicine

## 2014-08-20 DIAGNOSIS — D126 Benign neoplasm of colon, unspecified: Secondary | ICD-10-CM

## 2014-08-20 HISTORY — DX: Benign neoplasm of colon, unspecified: D12.6

## 2014-08-21 ENCOUNTER — Encounter: Payer: Medicare Other | Admitting: Gastroenterology

## 2014-08-21 ENCOUNTER — Telehealth: Payer: Self-pay | Admitting: *Deleted

## 2014-08-21 NOTE — Telephone Encounter (Signed)
Patient presented for colonoscopy drinking prep and apple juice. Did not understand his instructions. Coumadin had been stopped for last 5 days, will restart today. Previsit scheduled 08/23/14 and colonoscopy rescheduled for 08/29/14. Per Dr. Fuller Plan, no charge for cancelled colonoscopy. Friend who brought patient will bring patient for previsit and help him with instructions.

## 2014-08-22 ENCOUNTER — Encounter: Payer: Self-pay | Admitting: Internal Medicine

## 2014-08-23 ENCOUNTER — Ambulatory Visit (AMBULATORY_SURGERY_CENTER): Payer: Self-pay | Admitting: *Deleted

## 2014-08-23 VITALS — Ht 66.0 in | Wt 208.4 lb

## 2014-08-23 DIAGNOSIS — Z1211 Encounter for screening for malignant neoplasm of colon: Secondary | ICD-10-CM

## 2014-08-23 MED ORDER — MOVIPREP 100 G PO SOLR: 1.0000 | Freq: Once | ORAL | Status: DC

## 2014-08-23 NOTE — Progress Notes (Signed)
No egg or soy allergy No diet pills Takes methadone daily No home 02 use No issues with past sedation Pt instructed multiple times about no drinking after 1200 Wednesday 2-10. Went over diet the day before several times as well. No one with [name] at [facility] today

## 2014-08-24 ENCOUNTER — Other Ambulatory Visit (HOSPITAL_BASED_OUTPATIENT_CLINIC_OR_DEPARTMENT_OTHER): Payer: Medicare Other

## 2014-08-24 ENCOUNTER — Encounter: Payer: Self-pay | Admitting: Hematology and Oncology

## 2014-08-24 ENCOUNTER — Telehealth: Payer: Self-pay | Admitting: Pharmacist

## 2014-08-24 ENCOUNTER — Telehealth: Payer: Self-pay | Admitting: Hematology and Oncology

## 2014-08-24 ENCOUNTER — Ambulatory Visit (HOSPITAL_BASED_OUTPATIENT_CLINIC_OR_DEPARTMENT_OTHER): Payer: Medicare Other | Admitting: Hematology and Oncology

## 2014-08-24 VITALS — BP 135/66 | HR 53 | Temp 98.1°F | Resp 21 | Ht 66.0 in | Wt 207.4 lb

## 2014-08-24 DIAGNOSIS — D630 Anemia in neoplastic disease: Secondary | ICD-10-CM

## 2014-08-24 DIAGNOSIS — D6959 Other secondary thrombocytopenia: Secondary | ICD-10-CM

## 2014-08-24 DIAGNOSIS — T50905A Adverse effect of unspecified drugs, medicaments and biological substances, initial encounter: Secondary | ICD-10-CM

## 2014-08-24 DIAGNOSIS — Z720 Tobacco use: Secondary | ICD-10-CM

## 2014-08-24 DIAGNOSIS — Z7901 Long term (current) use of anticoagulants: Secondary | ICD-10-CM

## 2014-08-24 DIAGNOSIS — D45 Polycythemia vera: Secondary | ICD-10-CM

## 2014-08-24 DIAGNOSIS — D471 Chronic myeloproliferative disease: Secondary | ICD-10-CM

## 2014-08-24 DIAGNOSIS — R05 Cough: Secondary | ICD-10-CM

## 2014-08-24 DIAGNOSIS — T451X5A Adverse effect of antineoplastic and immunosuppressive drugs, initial encounter: Secondary | ICD-10-CM

## 2014-08-24 DIAGNOSIS — D701 Agranulocytosis secondary to cancer chemotherapy: Secondary | ICD-10-CM

## 2014-08-24 LAB — CBC WITH DIFFERENTIAL/PLATELET
BASO%: 0.3 % (ref 0.0–2.0)
Basophils Absolute: 0 10*3/uL (ref 0.0–0.1)
EOS%: 1.9 % (ref 0.0–7.0)
Eosinophils Absolute: 0.1 10*3/uL (ref 0.0–0.5)
HCT: 36.7 % — ABNORMAL LOW (ref 38.4–49.9)
HEMOGLOBIN: 12.6 g/dL — AB (ref 13.0–17.1)
LYMPH#: 0.9 10*3/uL (ref 0.9–3.3)
LYMPH%: 23.3 % (ref 14.0–49.0)
MCH: 40.9 pg — AB (ref 27.2–33.4)
MCHC: 34.3 g/dL (ref 32.0–36.0)
MCV: 119.2 fL — ABNORMAL HIGH (ref 79.3–98.0)
MONO#: 0.4 10*3/uL (ref 0.1–0.9)
MONO%: 10.5 % (ref 0.0–14.0)
NEUT%: 64 % (ref 39.0–75.0)
NEUTROS ABS: 2.4 10*3/uL (ref 1.5–6.5)
Platelets: 90 10*3/uL — ABNORMAL LOW (ref 140–400)
RBC: 3.08 10*6/uL — ABNORMAL LOW (ref 4.20–5.82)
RDW: 15.3 % — ABNORMAL HIGH (ref 11.0–14.6)
WBC: 3.7 10*3/uL — ABNORMAL LOW (ref 4.0–10.3)

## 2014-08-24 MED ORDER — HYDROXYUREA 500 MG PO CAPS: 1000.0000 mg | ORAL_CAPSULE | Freq: Every day | ORAL | Status: DC

## 2014-08-24 NOTE — Assessment & Plan Note (Signed)
This is likely due to recent treatment and possible side-effects of recent addition of amiodarone. The patient denies recent history of bleeding such as epistaxis, hematuria or hematochezia. He is asymptomatic from the low platelet count. I will observe for now.  he does not require transfusion now. I will reduce the dose of hydroxyurea as above.

## 2014-08-24 NOTE — Assessment & Plan Note (Signed)
The patient denies any recent signs or symptoms of bleeding such as spontaneous epistaxis, hematuria or hematochezia. He is at risk of bleeding due to recent thrombocytopenia. I will monitor his blood counts carefully and bring him back to recheck labs 1 month from now

## 2014-08-24 NOTE — Progress Notes (Signed)
[place] OFFICE PROGRESS NOTE  Patient Care Team: Cresenciano Genre, MD as PCP - General (Internal Medicine) Heath Lark, MD as Consulting Physician (Hematology and Oncology)  SUMMARY OF ONCOLOGIC HISTORY:  This is a pleasant gentleman who is being referred here because of high hemoglobin level. In November 2014, we removed one unit of blood and order an additional workup to rule out myeloproliferative disorder. Blood test result confirmed low erythropoietin level along with detectable JAK 2 mutation, confirmed myeloproliferative disorder Unfortunately, bone marrow biopsy on 06/26/2013 was nondiagnostic On 07/31/13 the patient was started on 1 hydroxyurea a day On 08/15/2013, increased hydroxyurea to 2 tablets a day and remove one more unit of blood due to the high hemoglobin On 09/05/2013, I increased hydroxyurea to 3 tablets a day and remove one more unit of blood due to high hemoglobin and hematocrit On 01/23/2014, dose of hydroxyurea is reduced to 2 tablets a day due to anemia.  INTERVAL HISTORY: Please see below for problem oriented charting. The patient returns for further follow-up. He stated he is compliant taking medicine. He has nonproductive cough. He continues to smoke. Previous, told the patient to reduce hydroxyurea to 2 tablets a day but he continues to take 3 tablets due to misunderstanding.  REVIEW OF SYSTEMS:   Constitutional: Denies fevers, chills or abnormal weight loss Eyes: Denies blurriness of vision Ears, nose, mouth, throat, and face: Denies mucositis or sore throat Cardiovascular: Denies palpitation, chest discomfort or lower extremity swelling Gastrointestinal:  Denies nausea, heartburn or change in bowel habits Skin: Denies abnormal skin rashes Lymphatics: Denies new lymphadenopathy or easy bruising Neurological:Denies numbness, tingling or new weaknesses Behavioral/Psych: Mood is stable, no new changes  All other systems were reviewed with  the patient and are negative.  I have reviewed the past medical history, past surgical history, social history and family history with the patient and they are unchanged from previous note.  ALLERGIES:  is allergic to codeine.  MEDICATIONS:  Current Outpatient Prescriptions  Medication Sig Dispense Refill  . albuterol (PROAIR HFA) 108 (90 BASE) MCG/ACT inhaler Inhale 1-2 puffs into the lungs daily as needed for wheezing or shortness of breath. 18 g 5  . amiodarone (PACERONE) 200 MG tablet Take 1 tablet (200 mg total) by mouth daily. 30 tablet 1  . amLODipine (NORVASC) 5 MG tablet take 1 tablet by mouth once daily 30 tablet 2  . Budesonide (PULMICORT FLEXHALER) 90 MCG/ACT inhaler Inhale 1 puff into the lungs every evening.    . carvedilol (COREG) 12.5 MG tablet Take 1 tablet (12.5 mg total) by mouth 2 (two) times daily with a meal. 60 tablet 2  . furosemide (LASIX) 20 MG tablet Take 1 tablet (20 mg total) by mouth daily. 30 tablet 1  . hydroxyurea (HYDREA) 500 MG capsule Take 2 capsules (1,000 mg total) by mouth daily. May take with food to minimize GI side effects. 90 capsule 3  . methadone (DOLOPHINE) 10 MG/5ML solution Take 40 mg by mouth every morning.     [name] MOVIPREP 100 G SOLR Take 1 kit (200 g total) by mouth once. moviprep as directed. No substitutions 1 kit 0  . ondansetron (ZOFRAN ODT) 4 MG disintegrating tablet Take 1 tablet (4 mg total) by mouth every 8 (eight) hours as needed for nausea. 10 tablet 0  . tiotropium (SPIRIVA HANDIHALER) 18 MCG inhalation capsule Place 1 capsule (18 mcg total) into inhaler and inhale daily. 30 capsule 12  . warfarin (COUMADIN) 5 MG tablet  Take 2.5 mg (0.5 tablet) daily except 5 mg (1 tablet) on Mondays and Thursdays. 25 tablet 1   No current facility-administered medications for this visit.    PHYSICAL EXAMINATION: ECOG PERFORMANCE STATUS: 0 - Asymptomatic  Filed Vitals:   08/24/14 0858  BP: 135/66  Pulse: 53  Temp: 98.1 F (36.7 C)  Resp: 21    Filed Weights   08/24/14 0858  Weight: 207 lb 6.4 oz (94.076 kg)    GENERAL:alert, no distress and comfortable SKIN: skin color, texture, turgor are normal, no rashes or significant lesions EYES: normal, Conjunctiva are pink and non-injected, sclera clear OROPHARYNX:no exudate, no erythema and lips, buccal mucosa, and tongue normal  NECK: supple, thyroid normal size, non-tender, without nodularity LYMPH:  no palpable lymphadenopathy in the cervical, axillary or inguinal LUNGS: clear to auscultation and percussion with normal breathing effort HEART: regular rate & rhythm and no murmurs and no lower extremity edema ABDOMEN:abdomen soft, non-tender and normal bowel sounds Musculoskeletal:no cyanosis of digits and no clubbing  NEURO: alert & oriented x 3 with fluent speech, no focal motor/sensory deficits  LABORATORY DATA:  I have reviewed the data as listed    Component Value Date/Time   NA 139 07/24/2014 2044   NA 143 06/12/2013 1125   K 4.0 07/24/2014 2044   K 3.7 06/12/2013 1125   CL 104 07/24/2014 2044   CO2 24 07/24/2014 2044   CO2 32* 06/12/2013 1125   GLUCOSE 153* 07/24/2014 2044   GLUCOSE 92 06/12/2013 1125   BUN 23 07/24/2014 2044   BUN 14.6 06/12/2013 1125   CREATININE 1.06 07/24/2014 2044   CREATININE 1.08 07/03/2014 0858   CREATININE 0.9 06/12/2013 1125   CALCIUM 8.4 07/24/2014 2044   CALCIUM 8.9 06/12/2013 1125   PROT 7.1 07/24/2014 2044   PROT 6.9 06/12/2013 1125   ALBUMIN 3.8 07/24/2014 2044   ALBUMIN 3.3* 06/12/2013 1125   AST 23 07/24/2014 2044   AST 21 06/12/2013 1125   ALT 14 07/24/2014 2044   ALT 28 06/12/2013 1125   ALKPHOS 79 07/24/2014 2044   ALKPHOS 112 06/12/2013 1125   BILITOT 0.7 07/24/2014 2044   BILITOT 0.82 06/12/2013 1125   GFRNONAA 71* 07/24/2014 2044   GFRNONAA 71 07/03/2014 0858   GFRAA 82* 07/24/2014 2044   GFRAA 82 07/03/2014 0858    No results found for: SPEP, UPEP  Lab Results  Component Value Date   WBC 3.7* 08/24/2014    NEUTROABS 2.4 08/24/2014   HGB 12.6* 08/24/2014   HCT 36.7* 08/24/2014   MCV 119.2* 08/24/2014   PLT 90* 08/24/2014      Chemistry      Component Value Date/Time   NA 139 07/24/2014 2044   NA 143 06/12/2013 1125   K 4.0 07/24/2014 2044   K 3.7 06/12/2013 1125   CL 104 07/24/2014 2044   CO2 24 07/24/2014 2044   CO2 32* 06/12/2013 1125   BUN 23 07/24/2014 2044   BUN 14.6 06/12/2013 1125   CREATININE 1.06 07/24/2014 2044   CREATININE 1.08 07/03/2014 0858   CREATININE 0.9 06/12/2013 1125      Component Value Date/Time   CALCIUM 8.4 07/24/2014 2044   CALCIUM 8.9 06/12/2013 1125   ALKPHOS 79 07/24/2014 2044   ALKPHOS 112 06/12/2013 1125   AST 23 07/24/2014 2044   AST 21 06/12/2013 1125   ALT 14 07/24/2014 2044   ALT 28 06/12/2013 1125   BILITOT 0.7 07/24/2014 2044   BILITOT 0.82 06/12/2013 1125  RADIOGRAPHIC STUDIES: I have personally reviewed the radiological images as listed and agreed with the findings in the report. No results found.   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 He tolerated the hydroxyurea well. Unfortunately, he is now developing thrombocytopenia. I am reducing his hydroxyurea to 2 tablets daily and recheck again in 1 month.      Anemia in neoplastic disease This is likely due to recent treatment. The patient denies recent history of bleeding such as epistaxis, hematuria or hematochezia. He is asymptomatic from the anemia. I will observe for now.  I will reduce the dose of hydroxyurea as above.    Chronic anticoagulation The patient denies any recent signs or symptoms of bleeding such as spontaneous epistaxis, hematuria or hematochezia. He is at risk of bleeding due to recent thrombocytopenia. I will monitor his blood counts carefully and bring him back to recheck labs 1 month from now   Thrombocytopenia due to drugs This is likely due to recent treatment and  possible side-effects of recent addition of amiodarone. The patient denies recent history of bleeding such as epistaxis, hematuria or hematochezia. He is asymptomatic from the low platelet count. I will observe for now.  he does not require transfusion now. I will reduce the dose of hydroxyurea as above.   Leukopenia due to antineoplastic chemotherapy This is likely due to recent treatment. The patient denies recent history of fevers, cough, chills, diarrhea or dysuria. He is asymptomatic from the leukopenia. I will observe for now. I will reduce the dose of his treatment as above.   Tobacco abuse I spent some time counseling the patient the importance of tobacco cessation. He is currently not interested to quit now.    his chest exam is normal. I suspect the cough is due to COPD. Recommend he quit smoking.  All questions were answered. The patient knows to call the clinic with any problems, questions or concerns. No barriers to learning was detected. I spent 25 minutes counseling the patient face to face. The total time spent in the appointment was 30 minutes and more than 50% was on counseling and review of test results     [facility], [place], MD 08/24/2014 9:20 AM

## 2014-08-24 NOTE — Assessment & Plan Note (Signed)
This is likely due to recent treatment. The patient denies recent history of bleeding such as epistaxis, hematuria or hematochezia. He is asymptomatic from the anemia. I will observe for now.  I will reduce the dose of hydroxyurea as above.

## 2014-08-24 NOTE — Telephone Encounter (Signed)
gv and printed appt sched anda vs for pt for March.... °

## 2014-08-24 NOTE — Telephone Encounter (Signed)
Call to patient to confirm appointment for 08/27/14 at 9:30 vm not set up

## 2014-08-24 NOTE — Assessment & Plan Note (Signed)
The patient has JAK2 mutation positive myeloproliferative disorder. He tolerated the hydroxyurea well. Unfortunately, he is now developing thrombocytopenia. I am reducing his hydroxyurea to 2 tablets daily and recheck again in 1 month.

## 2014-08-24 NOTE — Assessment & Plan Note (Signed)
I spent some time counseling the patient the importance of tobacco cessation. He is currently not interested to quit now.  

## 2014-08-24 NOTE — Assessment & Plan Note (Signed)
This is likely due to recent treatment. The patient denies recent history of fevers, cough, chills, diarrhea or dysuria. He is asymptomatic from the leukopenia. I will observe for now. I will reduce the dose of his treatment as above.

## 2014-08-27 ENCOUNTER — Ambulatory Visit (INDEPENDENT_AMBULATORY_CARE_PROVIDER_SITE_OTHER): Payer: Medicare Other | Admitting: Pharmacist

## 2014-08-27 DIAGNOSIS — I4891 Unspecified atrial fibrillation: Secondary | ICD-10-CM

## 2014-08-27 DIAGNOSIS — Z7901 Long term (current) use of anticoagulants: Secondary | ICD-10-CM

## 2014-08-27 LAB — POCT INR: INR: 1.2

## 2014-08-28 LAB — MDC_IDC_ENUM_SESS_TYPE_REMOTE: MDC IDC SESS DTM: 20160120050500

## 2014-08-29 ENCOUNTER — Encounter: Payer: Self-pay | Admitting: Gastroenterology

## 2014-08-29 ENCOUNTER — Ambulatory Visit (AMBULATORY_SURGERY_CENTER): Payer: Medicare Other | Admitting: Gastroenterology

## 2014-08-29 VITALS — BP 154/89 | HR 65 | Temp 98.1°F | Resp 14 | Ht 66.0 in | Wt 208.0 lb

## 2014-08-29 DIAGNOSIS — D120 Benign neoplasm of cecum: Secondary | ICD-10-CM

## 2014-08-29 DIAGNOSIS — Z1211 Encounter for screening for malignant neoplasm of colon: Secondary | ICD-10-CM

## 2014-08-29 DIAGNOSIS — D123 Benign neoplasm of transverse colon: Secondary | ICD-10-CM

## 2014-08-29 MED ORDER — SODIUM CHLORIDE 0.9 % IV SOLN: 500.0000 mL | INTRAVENOUS | Status: DC

## 2014-08-29 NOTE — Progress Notes (Signed)
Stable to RR 

## 2014-08-29 NOTE — Progress Notes (Signed)
Called to room to assist during endoscopic procedure.  Patient ID and intended procedure confirmed with present staff. Received instructions for my participation in the procedure from the performing physician.  

## 2014-08-29 NOTE — Op Note (Addendum)
[place]  [address] [place], [address]   COLONOSCOPY PROCEDURE REPORT  PATIENT: Ryan, Hamilton  MR#: [id number] BIRTHDATE: 11/12/1945 , 67  yrs. old GENDER: male ENDOSCOPIST: Ladene Artist, MD, [facility] REFERRED BY: Karlyn Agee, MD PROCEDURE DATE:  08/29/2014 PROCEDURE:   Colonoscopy with biopsy and Colonoscopy with snare polypectomy First Screening Colonoscopy - Avg.  risk and is 50 yrs.  old or older Yes.  Prior Negative Screening - Now for repeat screening. N/A  History of Adenoma - Now for follow-up colonoscopy & has been > or = to 3 yrs.  N/A  Polyps Removed Today? Yes. ASA CLASS:   Class III INDICATIONS:average risk patient for colorectal cancer. MEDICATIONS: Monitored anesthesia care and Propofol 200 mg IV and lidocaine 40 mg IV DESCRIPTION OF PROCEDURE:   After the risks benefits and alternatives of the procedure were thoroughly explained, informed consent was obtained.  The digital rectal exam revealed no abnormalities of the rectum.   The LB [id number]  endoscope was introduced through the anus and advanced to the cecum, which was identified by both the appendix and ileocecal valve. No adverse events experienced.   The quality of the prep was good, using MoviPrep  The instrument was then slowly withdrawn as the colon was fully examined.  COLON FINDINGS: Two sessile polyps measuring 3-6 mm in size were found at the cecum.  Polypectomies were performed with a cold snare and with cold forceps.  The resection was complete, the polyp tissue was completely retrieved and sent to histology.   A semi-pedunculated polyp measuring 7 mm in size was found in the transverse colon.  A polypectomy was performed with a cold snare. The resection was complete, the polyp tissue was completely retrieved and sent to histology.   The examination was otherwise normal.  Retroflexion was not performed due to a narrow rectal vault. The time to cecum=1  minutes 14 seconds.  Withdrawal time=10 minutes 20 seconds.  The scope was withdrawn and the procedure completed. COMPLICATIONS: There were no immediate complications.  ENDOSCOPIC IMPRESSION: 1.   Two sessile polyps at the cecum; polypectomies performed with a cold snare and with cold forceps 2.   Semi-pedunculated polyp in the transverse colon; polypectomy performed with a cold snare 3.   The examination was otherwise normal  RECOMMENDATIONS: 1.  Hold Aspirin and all other NSAIDS for 2 weeks. 2.  Await pathology results 3.  Repeat colonoscopy in 5 years if polyp adenomatous; otherwise 10 years 4.  Resume Coumadin (warfarin) tomorrow and have your PT/INR checked within 1 week.  eSigned:  Ladene Artist, MD, [facility] 08/29/2014 3:22 PM Revised: 08/29/2014 3:22 PM

## 2014-08-29 NOTE — Patient Instructions (Signed)
Colon polyps x 3 removed. Hold aspirin, NSAIDs, and anti-inflammatory medications for 2 weeks.  Handout given on polyps. Resume Coumadin(warfarin) tomorrow and have PT/INR checked within 1 week.  YOU HAD AN ENDOSCOPIC PROCEDURE TODAY AT [facility]: Refer to the procedure report that was given to you for any specific questions about what was found during the examination.  If the procedure report does not answer your questions, please call your gastroenterologist to clarify.  If you requested that your care partner not be given the details of your procedure findings, then the procedure report has been included in a sealed envelope for you to review at your convenience later.  YOU SHOULD EXPECT: Some feelings of bloating in the abdomen. Passage of more gas than usual.  Walking can help get rid of the air that was put into your GI tract during the procedure and reduce the bloating. If you had a lower endoscopy (such as a colonoscopy or flexible sigmoidoscopy) you may notice spotting of blood in your stool or on the toilet paper. If you underwent a bowel prep for your procedure, then you may not have a normal bowel movement for a few days.  DIET: Your first meal following the procedure should be a light meal and then it is ok to progress to your normal diet.  A half-sandwich or bowl of soup is an example of a good first meal.  Heavy or fried foods are harder to digest and may make you feel nauseous or bloated.  Likewise meals heavy in dairy and vegetables can cause extra gas to form and this can also increase the bloating.  Drink plenty of fluids but you should avoid alcoholic beverages for 24 hours.  ACTIVITY: Your care partner should take you home directly after the procedure.  You should plan to take it easy, moving slowly for the rest of the day.  You can resume normal activity the day after the procedure however you should NOT DRIVE or use heavy machinery for 24 hours (because of the  sedation medicines used during the test).    SYMPTOMS TO REPORT IMMEDIATELY: A gastroenterologist can be reached at any hour.  During normal business hours, 8:30 AM to 5:00 PM Monday through Friday, call [phone].  After hours and on weekends, please call the GI answering service at [phone] who will take a message and have the physician on call contact you.   Following lower endoscopy (colonoscopy or flexible sigmoidoscopy):  Excessive amounts of blood in the stool  Significant tenderness or worsening of abdominal pains  Swelling of the abdomen that is new, acute  Fever of 100F or higher  Following upper endoscopy (EGD)  Vomiting of blood or coffee ground material  New chest pain or pain under the shoulder blades  Painful or persistently difficult swallowing  New shortness of breath  Fever of 100F or higher  Black, tarry-looking stools  FOLLOW UP: If any biopsies were taken you will be contacted by phone or by letter within the next 1-3 weeks.  Call your gastroenterologist if you have not heard about the biopsies in 3 weeks.  Our staff will call the home number listed on your records the next business day following your procedure to check on you and address any questions or concerns that you may have at that time regarding the information given to you following your procedure. This is a courtesy call and so if there is no answer at the home number and we have  not heard from you through the emergency physician on call, we will assume that you have returned to your regular daily activities without incident.  SIGNATURES/CONFIDENTIALITY: You and/or your care partner have signed paperwork which will be entered into your electronic medical record.  These signatures attest to the fact that that the information above on your After Visit Summary has been reviewed and is understood.  Full responsibility of the confidentiality of this discharge information lies with you and/or your  care-partner.

## 2014-08-30 ENCOUNTER — Ambulatory Visit (INDEPENDENT_AMBULATORY_CARE_PROVIDER_SITE_OTHER): Payer: Medicare Other | Admitting: Internal Medicine

## 2014-08-30 ENCOUNTER — Telehealth: Payer: Self-pay

## 2014-08-30 ENCOUNTER — Ambulatory Visit (INDEPENDENT_AMBULATORY_CARE_PROVIDER_SITE_OTHER): Payer: Medicare Other | Admitting: *Deleted

## 2014-08-30 ENCOUNTER — Encounter: Payer: Self-pay | Admitting: Internal Medicine

## 2014-08-30 VITALS — BP 111/59 | HR 55 | Temp 98.3°F | Ht 66.0 in | Wt 210.9 lb

## 2014-08-30 DIAGNOSIS — R55 Syncope and collapse: Secondary | ICD-10-CM

## 2014-08-30 DIAGNOSIS — Z0000 Encounter for general adult medical examination without abnormal findings: Secondary | ICD-10-CM

## 2014-08-30 DIAGNOSIS — Z720 Tobacco use: Secondary | ICD-10-CM

## 2014-08-30 DIAGNOSIS — I519 Heart disease, unspecified: Secondary | ICD-10-CM

## 2014-08-30 DIAGNOSIS — I10 Essential (primary) hypertension: Secondary | ICD-10-CM

## 2014-08-30 DIAGNOSIS — R001 Bradycardia, unspecified: Secondary | ICD-10-CM

## 2014-08-30 DIAGNOSIS — I5189 Other ill-defined heart diseases: Secondary | ICD-10-CM

## 2014-08-30 DIAGNOSIS — I4891 Unspecified atrial fibrillation: Secondary | ICD-10-CM

## 2014-08-30 LAB — POCT INR: INR: 1.1

## 2014-08-30 MED ORDER — FUROSEMIDE 40 MG PO TABS: 40.0000 mg | ORAL_TABLET | Freq: Every day | ORAL | Status: DC

## 2014-08-30 MED ORDER — AMIODARONE HCL 200 MG PO TABS: 200.0000 mg | ORAL_TABLET | Freq: Every day | ORAL | Status: DC

## 2014-08-30 NOTE — Progress Notes (Signed)
Anti-Coagulation Progress Note  [name] is a 69 y.o. male who is currently on an anti-coagulation regimen.    RECENT RESULTS: Recent results are below, the most recent result is correlated with a dose of ZEROmg for past 3 days--he is day 3 of a 5 day hold leading up to planned colonoscopy on Wednesday 10-FEB-16. Lab Results  Component Value Date   INR 1.1 08/30/2014   INR 1.20 08/27/2014   INR 2.10 08/13/2014    ANTI-COAG DOSE: Anticoagulation Dose Instructions as of 08/27/2014      [name] Tue Wed Thu Fri Sat   New Dose 2.5 mg 0 mg 0 mg 0 mg 5 mg 2.5 mg 5 mg       ANTICOAG SUMMARY: Anticoagulation Episode Summary    Current INR goal 2.0-3.0  Next INR check 09/03/2014  INR from last check 1.20! (08/27/2014)  Most recent INR 1.1! (08/30/2014)  Weekly max dose   Target end date Indefinite  [facility]  Preferred lab   Send INR reminders to    Indications  Atrial fibrillation [I48.91]        Comments         ANTICOAG TODAY: Anticoagulation Summary as of 08/27/2014    INR goal 2.0-3.0  Selected INR 1.20! (08/27/2014)  Next INR check 09/03/2014  Target end date Indefinite   Indications  Atrial fibrillation [I48.91]      Anticoagulation Episode Summary    [facility]   Preferred lab    Send INR reminders to    Comments       PATIENT INSTRUCTIONS: Patient Instructions  Patient instructed to take medications as defined in the Anti-coagulation Track section of this encounter.  Patient instructed to OMIT today's dose; OMIT Tuesdays dose; OMIT Wednesdays dose (day of colonosocopy) Recommence warfarin based upon instructions provided on www.doseresponse sheet AFTER your procedure.   Patient verbalized understanding of these instructions.       FOLLOW-UP Return in 7 days (on 09/03/2014) for Follow up INR at 0945h.  [name], III Pharm.D., CACP

## 2014-08-30 NOTE — Telephone Encounter (Signed)
  Follow up Call-  Call back number 08/29/2014  Post procedure Call Back phone  # [phone]  Permission to leave phone message Yes     Patient questions:  Do you have a fever, pain , or abdominal swelling? No. Pain Score  0 *  Have you tolerated food without any problems? Yes.    Have you been able to return to your normal activities? Yes.    Do you have any questions about your discharge instructions: Diet   No. Medications  No. Follow up visit  No.  Do you have questions or concerns about your Care? No.  Actions: * If pain score is 4 or above: No action needed, pain <4.   No problems per the pt. maw

## 2014-08-30 NOTE — Patient Instructions (Signed)
Patient instructed to take medications as defined in the Anti-coagulation Track section of this encounter.  Patient instructed to OMIT today's dose; OMIT Tuesdays dose; OMIT Wednesdays dose (day of colonosocopy) Recommence warfarin based upon instructions provided on www.doseresponse sheet AFTER your procedure.   Patient verbalized understanding of these instructions.

## 2014-08-30 NOTE — Progress Notes (Signed)
Loop recorder 

## 2014-08-30 NOTE — Patient Instructions (Addendum)
General Instructions: I will check blood work today and your coumadin number Please increase Lasix to 40 mg (2 pills per day) Follow up in 1-3 months, sooner if needed   Treatment Goals:  Goals (1 Years of Data) as of 08/30/14          As of Today 08/29/14 08/29/14 08/29/14 08/29/14     Blood Pressure   . Blood Pressure < 140/90  111/59 154/89 135/69 136/82 127/71      Progress Toward Treatment Goals:  Treatment Goal 08/30/2014  Blood pressure at goal  Stop smoking smoking less    Self Care Goals & Plans:  Self Care Goal 06/12/2014  Manage my medications take my medicines as prescribed; bring my medications to every visit; refill my medications on time; follow the sick day instructions if I am sick  Monitor my health -  Eat healthy foods eat more vegetables; eat fruit for snacks and desserts; eat baked foods instead of fried foods; eat foods that are low in salt; eat smaller portions  Be physically active find an activity I enjoy  Stop smoking -  Meeting treatment goals -    No flowsheet data found.   Care Management & Community Referrals:  Referral 08/30/2014  Referrals made for care management support none needed  Referrals made to community resources none       Edema Edema is an abnormal buildup of fluids in your bodytissues. Edema is somewhatdependent on gravity to pull the fluid to the lowest place in your body. That makes the condition more common in the legs and thighs (lower extremities). Painless swelling of the feet and ankles is common and becomes more likely as you get older. It is also common in looser tissues, like around your eyes.  When the affected area is squeezed, the fluid may move out of that spot and leave a dent for a few moments. This dent is called pitting.  CAUSES  There are many possible causes of edema. Eating too much salt and being on your feet or sitting for a long time can cause edema in your legs and ankles. Hot weather may make edema  worse. Common medical causes of edema include:  Heart failure.  Liver disease.  Kidney disease.  Weak blood vessels in your legs.  Cancer.  An injury.  Pregnancy.  Some medications.  Obesity. SYMPTOMS  Edema is usually painless.Your skin may look swollen or shiny.  DIAGNOSIS  Your health care provider may be able to diagnose edema by asking about your medical history and doing a physical exam. You may need to have tests such as X-rays, an electrocardiogram, or blood tests to check for medical conditions that may cause edema.  TREATMENT  Edema treatment depends on the cause. If you have heart, liver, or kidney disease, you need the treatment appropriate for these conditions. General treatment may include:  Elevation of the affected body part above the level of your heart.  Compression of the affected body part. Pressure from elastic bandages or support stockings squeezes the tissues and forces fluid back into the blood vessels. This keeps fluid from entering the tissues.  Restriction of fluid and salt intake.  Use of a water pill (diuretic). These medications are appropriate only for some types of edema. They pull fluid out of your body and make you urinate more often. This gets rid of fluid and reduces swelling, but diuretics can have side effects. Only use diuretics as directed by your health care provider. HOME  CARE INSTRUCTIONS   Keep the affected body part above the level of your heart when you are lying down.   Do not sit still or stand for prolonged periods.   Do not put anything directly under your knees when lying down.  Do not wear constricting clothing or garters on your upper legs.   Exercise your legs to work the fluid back into your blood vessels. This may help the swelling go down.   Wear elastic bandages or support stockings to reduce ankle swelling as directed by your health care provider.   Eat a low-salt diet to reduce fluid if your health care  provider recommends it.   Only take medicines as directed by your health care provider. SEEK MEDICAL CARE IF:   Your edema is not responding to treatment.  You have heart, liver, or kidney disease and notice symptoms of edema.  You have edema in your legs that does not improve after elevating them.   You have sudden and unexplained weight gain. SEEK IMMEDIATE MEDICAL CARE IF:   You develop shortness of breath or chest pain.   You cannot breathe when you lie down.  You develop pain, redness, or warmth in the swollen areas.   You have heart, liver, or kidney disease and suddenly get edema.  You have a fever and your symptoms suddenly get worse. MAKE SURE YOU:   Understand these instructions.  Will watch your condition.  Will get help right away if you are not doing well or get worse. Document Released: 07/06/2005 Document Revised: 11/20/2013 Document Reviewed: 04/28/2013 [facility] Patient Information 2015 [place], [place]. This information is not intended to replace advice given to you by your health care provider. Make sure you discuss any questions you have with your health care provider.

## 2014-08-31 ENCOUNTER — Encounter: Payer: Self-pay | Admitting: Internal Medicine

## 2014-08-31 LAB — MDC_IDC_ENUM_SESS_TYPE_REMOTE

## 2014-08-31 LAB — BASIC METABOLIC PANEL WITH GFR
BUN: 17 mg/dL (ref 6–23)
CALCIUM: 8.4 mg/dL (ref 8.4–10.5)
CO2: 29 mEq/L (ref 19–32)
CREATININE: 1.05 mg/dL (ref 0.50–1.35)
Chloride: 106 mEq/L (ref 96–112)
GFR, EST AFRICAN AMERICAN: 84 mL/min
GFR, Est Non African American: 73 mL/min
Glucose, Bld: 88 mg/dL (ref 70–99)
Potassium: 4 mEq/L (ref 3.5–5.3)
Sodium: 144 mEq/L (ref 135–145)

## 2014-08-31 LAB — LIPID PANEL
CHOLESTEROL: 151 mg/dL (ref 0–200)
HDL: 36 mg/dL — AB (ref >39)
LDL Cholesterol: 68 mg/dL (ref 0–99)
Total CHOL/HDL Ratio: 4.2 Ratio
Triglycerides: 234 mg/dL — ABNORMAL HIGH (ref <150)
VLDL: 47 mg/dL — ABNORMAL HIGH (ref 0–40)

## 2014-08-31 NOTE — Assessment & Plan Note (Signed)
Noted by H/O recently and they will repeat CBC in 1 month and also reduced hydrea (2 tablets).

## 2014-08-31 NOTE — Progress Notes (Signed)
Internal Medicine Clinic Attending Date of visit: 08/30/2014  Case discussed with Dr. Aundra Dubin soon after the resident saw the patient.  We reviewed the resident's history and exam and pertinent patient test results.  I agree with the assessment, diagnosis, and plan of care documented in the resident's note.

## 2014-08-31 NOTE — Assessment & Plan Note (Addendum)
Asymptomatic currently, will monitor for now has ILR intact  Will follow with cards in the future  If continues to be brady consider decreasing dose of Coreg

## 2014-08-31 NOTE — Assessment & Plan Note (Signed)
Denies heroin currently and currently on Methadone 43 mg qd from [facility]. clinic

## 2014-08-31 NOTE — Assessment & Plan Note (Signed)
colonoscopy 08/29/14 with 2 polyps biopsied and results pending to follow, per pt he had Zostavax at his pharmacy (will try to get the records from [facility]), will check lipid panel today

## 2014-08-31 NOTE — Assessment & Plan Note (Addendum)
INR subtherapeutic 1.1 due to stopping Coumadin for colonoscopy 08/29/14 Bradycardic today asymptomatic  Rx Refill Amiodarone 200 mg qd, continue Coreg 12.5 mg bid Asked pt to f/u with cardiology and get Amio from cards Pt will continue Coumadin for now previous dose until sees Dr. Elie Confer 2/15

## 2014-08-31 NOTE — Assessment & Plan Note (Addendum)
Noted echo 05/2014 With leg edema 2+, inc in weight.  will increase Lasix 20 mg to 40 mg Pt to try leg elevation for leg edema, disc TED hose but due to finances will hold on TED hose, rec decrease fluid intake Recheck BMET at f/u (with inc in Lasix dose)

## 2014-08-31 NOTE — Assessment & Plan Note (Signed)
Congratulated for cutting back and trying to quit

## 2014-08-31 NOTE — Progress Notes (Signed)
   Subjective:    Patient ID: [name], male    DOB: 06-Nov-1945, 69 y.o.   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 He denies complaints today and states he is feeling fine just had colonoscopy 2/10 and resumed Coumadin 2/11 2.5 most days except 5 mg on Monday per last dosing.   2. AF-on Coreg 12.5 mg bid, Amiodarone 200 mg qd and needs rx refill (prefer he get from Cardiology and advised). INR today 1.1 he had been off Coumadin for colonoscopy he had 08/29/14.  He resumed Coumadin today.   Per pt f/u with Dr. Elie Confer on 2/15 but no appt in charts. Spoke with Dr. Elie Confer and pt does have f/u on 2/15  3. H/o syncope-s/p ILR being followed by cardiology.  denies recent episodes of syncope. HR is 55 today denies dizziness or lightheadedness  4. Tobacco abuse-down to 1-2 cig per day. 5. On exam with 2+ leg edema states he is drinking a lot of water but denies increased salt intake.      HM-colonoscopy 08/29/14 with 2 polyps biopsied and results pending to follow, per pt he had Zostavax at his pharmacy (will try to get the records from [facility]), will check lipid panel today     Review of Systems  Constitutional: Negative for fever and chills.  Respiratory: Positive for cough. Negative for shortness of breath.        +sob with bending over intermittently +cough with yellow sputum x 2 days   Cardiovascular: Negative for chest pain.  Neurological: Negative for dizziness and light-headedness.       Objective:   Physical Exam  Constitutional: He is oriented to person, place, and time. He appears  well-developed and well-nourished. He is cooperative.  HENT:  Head: Normocephalic and atraumatic.  Mouth/Throat: No oropharyngeal exudate.  Eyes: Conjunctivae are normal. Pupils are equal, round, and reactive to light. Right eye exhibits no discharge. Left eye exhibits no discharge. No scleral icterus.  Cardiovascular: Normal heart sounds.  An irregularly irregular rhythm present. Bradycardia present.   No murmur heard. 2+ edema b/l lower ext   Pulmonary/Chest: Effort normal and breath sounds normal.  Abdominal: Soft. Bowel sounds are normal. He exhibits no distension. There is no tenderness.  Musculoskeletal: He exhibits no edema.  Neurological: He is alert and oriented to person, place, and time. Gait normal.  Skin: Skin is warm, dry and intact. No rash noted.  Psychiatric: He has a normal mood and affect. His speech is normal and behavior is normal. Judgment and thought content normal. Cognition and memory are normal.  Nursing note and vitals reviewed.         Assessment & Plan:  F/u in 3 months, sooner if needed  Dr. Elie Confer appt 2/15 Will f/u with cardiology prn last visit 07/2014

## 2014-08-31 NOTE — Progress Notes (Signed)
INTERNAL MEDICINE TEACHING ATTENDING ADDENDUM - Aldine Contes M.D  Duration- indefinite, Indication- afib, INR- sub therapeutic. Agree with Dr. Gladstone Pih recommendations as outlined in his note.

## 2014-08-31 NOTE — Assessment & Plan Note (Signed)
No further episodes  Device interrogated by cardiology and following with cards

## 2014-08-31 NOTE — Assessment & Plan Note (Signed)
BP Readings from Last 3 Encounters:  08/30/14 111/59  08/29/14 154/89  08/24/14 135/66    Lab Results  Component Value Date   NA 144 08/30/2014   K 4.0 08/30/2014   CREATININE 1.05 08/30/2014    Assessment: Blood pressure control: controlled Progress toward BP goal:  at goal Comments: none  Plan: Medications:  continue current medications (lasix 20 mg will increase to 40 mg due to leg edema, continue norvasc 5, Coreg 12.5 mg bid) Other plans: check BMET, lipid panel today f/u in 3 months

## 2014-09-03 ENCOUNTER — Encounter: Payer: Self-pay | Admitting: Internal Medicine

## 2014-09-03 ENCOUNTER — Ambulatory Visit: Payer: Medicare Other

## 2014-09-04 ENCOUNTER — Encounter: Payer: Self-pay | Admitting: Internal Medicine

## 2014-09-04 ENCOUNTER — Ambulatory Visit (INDEPENDENT_AMBULATORY_CARE_PROVIDER_SITE_OTHER): Payer: Medicare Other | Admitting: Pharmacist

## 2014-09-04 ENCOUNTER — Other Ambulatory Visit: Payer: Medicare Other

## 2014-09-04 DIAGNOSIS — I4891 Unspecified atrial fibrillation: Secondary | ICD-10-CM

## 2014-09-04 LAB — POCT INR: INR: 1.2

## 2014-09-04 NOTE — Patient Instructions (Signed)
Patient instructed to take medications as defined in the Anti-coagulation Track section of this encounter.  Patient instructed to take today's dose.  Patient verbalized understanding of these instructions.    

## 2014-09-04 NOTE — Progress Notes (Signed)
Anti-Coagulation Progress Note  [name] is a 69 y.o. male who is currently on an anti-coagulation regimen.    RECENT RESULTS: Recent results are below, the most recent result is correlated with a dose of having been off of warfarin for 5 days prior to planned colonoscopy last week. Just has recommenced warfarin. Will increase dose to 27.5mg /wk. Lab Results  Component Value Date   INR 1.20 09/04/2014   INR 1.1 08/30/2014   INR 1.20 08/27/2014    ANTI-COAG DOSE: Anticoagulation Dose Instructions as of 09/04/2014      [name]   New Dose 5 mg 2.5 mg 5 mg 2.5 mg 5 mg 2.5 mg 5 mg       ANTICOAG SUMMARY: Anticoagulation Episode Summary    Current INR goal 2.0-3.0  Next INR check 09/17/2014  INR from last check 1.20! (09/04/2014)  Weekly max dose   Target end date Indefinite  [facility]  Preferred lab   Send INR reminders to    Indications  Atrial fibrillation [I48.91]        Comments         ANTICOAG TODAY: Anticoagulation Summary as of 09/04/2014    INR goal 2.0-3.0  Selected INR 1.20! (09/04/2014)  Next INR check 09/17/2014  Target end date Indefinite   Indications  Atrial fibrillation [I48.91]      Anticoagulation Episode Summary    [facility]   Preferred lab    Send INR reminders to    Comments       PATIENT INSTRUCTIONS: Patient Instructions  Patient instructed to take medications as defined in the Anti-coagulation Track section of this encounter.  Patient instructed to take today's dose.  Patient verbalized understanding of these instructions.       FOLLOW-UP Return in 2 weeks (on 09/17/2014) for Follow up INR at 0900h.  [name], III Pharm.D., CACP

## 2014-09-05 ENCOUNTER — Encounter: Payer: Self-pay | Admitting: Internal Medicine

## 2014-09-09 ENCOUNTER — Encounter: Payer: Self-pay | Admitting: Gastroenterology

## 2014-09-10 ENCOUNTER — Other Ambulatory Visit: Payer: Self-pay | Admitting: Hematology and Oncology

## 2014-09-14 ENCOUNTER — Other Ambulatory Visit: Payer: Self-pay | Admitting: *Deleted

## 2014-09-14 DIAGNOSIS — D471 Chronic myeloproliferative disease: Secondary | ICD-10-CM

## 2014-09-14 MED ORDER — CARVEDILOL 12.5 MG PO TABS: 12.5000 mg | ORAL_TABLET | Freq: Two times a day (BID) | ORAL | Status: DC

## 2014-09-17 ENCOUNTER — Ambulatory Visit (INDEPENDENT_AMBULATORY_CARE_PROVIDER_SITE_OTHER): Payer: Medicare Other | Admitting: Pharmacist

## 2014-09-17 DIAGNOSIS — I4891 Unspecified atrial fibrillation: Secondary | ICD-10-CM

## 2014-09-17 LAB — POCT INR: INR: 1.3

## 2014-09-17 NOTE — Patient Instructions (Signed)
Patient instructed to take medications as defined in the Anti-coagulation Track section of this encounter.  Patient instructed to take today's dose.  Patient verbalized understanding of these instructions.    

## 2014-09-17 NOTE — Progress Notes (Signed)
INTERNAL MEDICINE TEACHING ATTENDING ADDENDUM - Azalee Weimer M.D  Duration- indefinite, Indication- afib, INR- sub therapeutic. Agree with pharmacy recommendations as outlined in their note.      

## 2014-09-17 NOTE — Progress Notes (Signed)
Anti-Coagulation Progress Note  [name] is a 69 y.o. male who is currently on an anti-coagulation regimen.  RECENT RESULTS:  Recent results are below, the most recent result is correlated with a dose of 12.5 mg. per week. Patient has been off of warfarin 5 days prior to colonoscopy last Wednesday. The day following the procedure, the patient resumed his usual 30mg /week regimen and has been on that for 5 days.   Lab Results  Component Value Date   INR 1.3 09/17/2014   INR 1.20 09/04/2014   INR 1.1 08/30/2014    ANTI-COAG DOSE:  Anticoagulation Dose Instructions as of 09/17/2014      [name] Tue Wed Thu Fri Sat   New Dose 5 mg 2.5 mg 5 mg 5 mg 5 mg 2.5 mg 5 mg      ANTICOAG SUMMARY:  Anticoagulation Episode Summary    Current INR goal 2.0-3.0  Next INR check 09/24/2014  INR from last check 1.3! (09/17/2014)  Weekly max dose   Target end date Indefinite  [facility]  Preferred lab   Send INR reminders to    Indications  Atrial fibrillation [I48.91]        Comments         ANTICOAG TODAY:  Anticoagulation Summary as of 09/17/2014    INR goal 2.0-3.0  Selected INR 1.3! (09/17/2014)  Next INR check 09/24/2014  Target end date Indefinite   Indications  Atrial fibrillation [I48.91]      Anticoagulation Episode Summary    [facility]   Preferred lab    Send INR reminders to    Comments       PATIENT INSTRUCTIONS:  Patient Instructions  Patient instructed to take medications as defined in the Anti-coagulation Track section of this encounter.  Patient instructed to take today's dose.  Patient verbalized understanding of these instructions.       FOLLOW-UP  Return in 1 week (on 09/24/2014) for Follow up INR at 9:45am.    Thank you for allowing pharmacy to be part of this patient's care team  [place], Pharm.D Clinical Pharmacy Resident Pager: [phone] 09/17/2014 .2:56 PM

## 2014-09-20 ENCOUNTER — Telehealth: Payer: Self-pay | Admitting: Hematology and Oncology

## 2014-09-20 ENCOUNTER — Encounter: Payer: Self-pay | Admitting: Hematology and Oncology

## 2014-09-20 ENCOUNTER — Other Ambulatory Visit (HOSPITAL_BASED_OUTPATIENT_CLINIC_OR_DEPARTMENT_OTHER): Payer: Medicare Other

## 2014-09-20 ENCOUNTER — Ambulatory Visit (HOSPITAL_BASED_OUTPATIENT_CLINIC_OR_DEPARTMENT_OTHER): Payer: Medicare Other | Admitting: Hematology and Oncology

## 2014-09-20 VITALS — BP 121/49 | HR 55 | Temp 98.4°F | Resp 18 | Ht 66.0 in | Wt 201.9 lb

## 2014-09-20 DIAGNOSIS — D471 Chronic myeloproliferative disease: Secondary | ICD-10-CM

## 2014-09-20 DIAGNOSIS — D6959 Other secondary thrombocytopenia: Secondary | ICD-10-CM

## 2014-09-20 DIAGNOSIS — D45 Polycythemia vera: Secondary | ICD-10-CM | POA: Diagnosis not present

## 2014-09-20 DIAGNOSIS — Z7901 Long term (current) use of anticoagulants: Secondary | ICD-10-CM | POA: Diagnosis not present

## 2014-09-20 DIAGNOSIS — D630 Anemia in neoplastic disease: Secondary | ICD-10-CM | POA: Diagnosis not present

## 2014-09-20 DIAGNOSIS — T50905A Adverse effect of unspecified drugs, medicaments and biological substances, initial encounter: Secondary | ICD-10-CM

## 2014-09-20 LAB — CBC WITH DIFFERENTIAL/PLATELET
BASO%: 0.5 % (ref 0.0–2.0)
Basophils Absolute: 0 10*3/uL (ref 0.0–0.1)
EOS%: 1.7 % (ref 0.0–7.0)
Eosinophils Absolute: 0.1 10*3/uL (ref 0.0–0.5)
HEMATOCRIT: 38 % — AB (ref 38.4–49.9)
HGB: 12.9 g/dL — ABNORMAL LOW (ref 13.0–17.1)
LYMPH%: 22.9 % (ref 14.0–49.0)
MCH: 42.2 pg — AB (ref 27.2–33.4)
MCHC: 34.1 g/dL (ref 32.0–36.0)
MCV: 123.9 fL — ABNORMAL HIGH (ref 79.3–98.0)
MONO#: 0.4 10*3/uL (ref 0.1–0.9)
MONO%: 10.8 % (ref 0.0–14.0)
NEUT#: 2.7 10*3/uL (ref 1.5–6.5)
NEUT%: 64.1 % (ref 39.0–75.0)
PLATELETS: 137 10*3/uL — AB (ref 140–400)
RBC: 3.06 10*6/uL — AB (ref 4.20–5.82)
RDW: 17.1 % — ABNORMAL HIGH (ref 11.0–14.6)
WBC: 4.1 10*3/uL (ref 4.0–10.3)
lymph#: 0.9 10*3/uL (ref 0.9–3.3)

## 2014-09-20 NOTE — Progress Notes (Signed)
[name] OFFICE PROGRESS NOTE  Patient Care Team: Cresenciano Genre, MD as PCP - General (Internal Medicine) Heath Lark, MD as Consulting Physician (Hematology and Oncology)  SUMMARY OF ONCOLOGIC HISTORY:  This is a pleasant gentleman who is being referred here because of high hemoglobin level. In November 2014, we removed one unit of blood and order an additional workup to rule out myeloproliferative disorder. Blood test result confirmed low erythropoietin level along with detectable JAK 2 mutation, confirmed myeloproliferative disorder Unfortunately, bone marrow biopsy on 06/26/2013 was nondiagnostic On 07/31/13 the patient was started on 1 hydroxyurea a day On 08/15/2013, increased hydroxyurea to 2 tablets a day and remove one more unit of blood due to the high hemoglobin On 09/05/2013, I increased hydroxyurea to 3 tablets a day and remove one more unit of blood due to high hemoglobin and hematocrit On 01/23/2014, dose of hydroxyurea is reduced to 2 tablets a day due to anemia.  INTERVAL HISTORY: Please see below for problem oriented charting. He is doing well. Denies recent infection. The patient denies any recent signs or symptoms of bleeding such as spontaneous epistaxis, hematuria or hematochezia.   REVIEW OF SYSTEMS:   Constitutional: Denies fevers, chills or abnormal weight loss Eyes: Denies blurriness of vision Ears, nose, mouth, throat, and face: Denies mucositis or sore throat Respiratory: Denies cough, dyspnea or wheezes Cardiovascular: Denies palpitation, chest discomfort or lower extremity swelling Gastrointestinal:  Denies nausea, heartburn or change in bowel habits Skin: Denies abnormal skin rashes Lymphatics: Denies new lymphadenopathy or easy bruising Neurological:Denies numbness, tingling or new weaknesses Behavioral/Psych: Mood is stable, no new changes  All other systems were reviewed with the patient and are negative.  I have reviewed the past  medical history, past surgical history, social history and family history with the patient and they are unchanged from previous note.  ALLERGIES:  is allergic to codeine.  MEDICATIONS:  Current Outpatient Prescriptions  Medication Sig Dispense Refill  . albuterol (PROAIR HFA) 108 (90 BASE) MCG/ACT inhaler Inhale 1-2 puffs into the lungs daily as needed for wheezing or shortness of breath. 18 g 5  . amiodarone (PACERONE) 200 MG tablet Take 1 tablet (200 mg total) by mouth daily. 30 tablet 1  . amLODipine (NORVASC) 5 MG tablet take 1 tablet by mouth once daily 30 tablet 2  . Budesonide (PULMICORT FLEXHALER) 90 MCG/ACT inhaler Inhale 1 puff into the lungs every evening.    . carvedilol (COREG) 12.5 MG tablet Take 1 tablet (12.5 mg total) by mouth 2 (two) times daily with a meal. 60 tablet 2  . furosemide (LASIX) 40 MG tablet Take 1 tablet (40 mg total) by mouth daily. 30 tablet 1  . hydroxyurea (HYDREA) 500 MG capsule Take 2 capsules (1,000 mg total) by mouth daily. May take with food to minimize GI side effects. 90 capsule 3  . methadone (DOLOPHINE) 10 MG/5ML solution Take 43 mg by mouth every morning.     . tiotropium (SPIRIVA HANDIHALER) 18 MCG inhalation capsule Place 1 capsule (18 mcg total) into inhaler and inhale daily. 30 capsule 12  . warfarin (COUMADIN) 5 MG tablet Take 2.5 mg (0.5 tablet) daily except 5 mg (1 tablet) on Mondays and Thursdays. 25 tablet 1   No current facility-administered medications for this visit.    PHYSICAL EXAMINATION: ECOG PERFORMANCE STATUS: 0 - Asymptomatic  Filed Vitals:   09/20/14 0842  BP: 121/49  Pulse: 55  Temp: 98.4 F (36.9 C)  Resp: 18   Filed  Weights   09/20/14 0842  Weight: 201 lb 14.4 oz (91.581 kg)    GENERAL:alert, no distress and comfortable SKIN: skin color, texture, turgor are normal, no rashes or significant lesions EYES: normal, Conjunctiva are pink and non-injected, sclera clear OROPHARYNX:no exudate, no erythema and lips,  buccal mucosa, and tongue normal  NECK: supple, thyroid normal size, non-tender, without nodularity LYMPH:  no palpable lymphadenopathy in the cervical, axillary or inguinal LUNGS: clear to auscultation and percussion with normal breathing effort HEART: regular rate & rhythm and no murmurs and no lower extremity edema ABDOMEN:abdomen soft, non-tender and normal bowel sounds Musculoskeletal:no cyanosis of digits and no clubbing  NEURO: alert & oriented x 3 with fluent speech, no focal motor/sensory deficits  LABORATORY DATA:  I have reviewed the data as listed    Component Value Date/Time   NA 144 08/30/2014 1628   NA 143 06/12/2013 1125   K 4.0 08/30/2014 1628   K 3.7 06/12/2013 1125   CL 106 08/30/2014 1628   CO2 29 08/30/2014 1628   CO2 32* 06/12/2013 1125   GLUCOSE 88 08/30/2014 1628   GLUCOSE 92 06/12/2013 1125   BUN 17 08/30/2014 1628   BUN 14.6 06/12/2013 1125   CREATININE 1.05 08/30/2014 1628   CREATININE 1.06 07/24/2014 2044   CREATININE 0.9 06/12/2013 1125   CALCIUM 8.4 08/30/2014 1628   CALCIUM 8.9 06/12/2013 1125   PROT 7.1 07/24/2014 2044   PROT 6.9 06/12/2013 1125   ALBUMIN 3.8 07/24/2014 2044   ALBUMIN 3.3* 06/12/2013 1125   AST 23 07/24/2014 2044   AST 21 06/12/2013 1125   ALT 14 07/24/2014 2044   ALT 28 06/12/2013 1125   ALKPHOS 79 07/24/2014 2044   ALKPHOS 112 06/12/2013 1125   BILITOT 0.7 07/24/2014 2044   BILITOT 0.82 06/12/2013 1125   GFRNONAA 73 08/30/2014 1628   GFRNONAA 71* 07/24/2014 2044   GFRAA 84 08/30/2014 1628   GFRAA 82* 07/24/2014 2044    No results found for: SPEP, UPEP  Lab Results  Component Value Date   WBC 4.1 09/20/2014   NEUTROABS 2.7 09/20/2014   HGB 12.9* 09/20/2014   HCT 38.0* 09/20/2014   MCV 123.9* 09/20/2014   PLT 137* 09/20/2014      Chemistry      Component Value Date/Time   NA 144 08/30/2014 1628   NA 143 06/12/2013 1125   K 4.0 08/30/2014 1628   K 3.7 06/12/2013 1125   CL 106 08/30/2014 1628   CO2 29  08/30/2014 1628   CO2 32* 06/12/2013 1125   BUN 17 08/30/2014 1628   BUN 14.6 06/12/2013 1125   CREATININE 1.05 08/30/2014 1628   CREATININE 1.06 07/24/2014 2044   CREATININE 0.9 06/12/2013 1125      Component Value Date/Time   CALCIUM 8.4 08/30/2014 1628   CALCIUM 8.9 06/12/2013 1125   ALKPHOS 79 07/24/2014 2044   ALKPHOS 112 06/12/2013 1125   AST 23 07/24/2014 2044   AST 21 06/12/2013 1125   ALT 14 07/24/2014 2044   ALT 28 06/12/2013 1125   BILITOT 0.7 07/24/2014 2044   BILITOT 0.82 06/12/2013 1125     ASSESSMENT & PLAN:  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 He tolerated the hydroxyurea well.  After recent dose reduction, he tolerated hydroxyurea 2 tablets daily.  I will see him back in 3 months.        Anemia in neoplastic disease This is likely due to recent treatment. The patient denies recent  history of bleeding such as epistaxis, hematuria or hematochezia. He is asymptomatic from the anemia. I will observe for now.     Chronic anticoagulation The patient denies any recent signs or symptoms of bleeding such as spontaneous epistaxis, hematuria or hematochezia.   Thrombocytopenia due to drugs This is likely due to recent treatment and possible side-effects of recent addition of amiodarone. The patient denies recent history of bleeding such as epistaxis, hematuria or hematochezia. He is asymptomatic from the low platelet count.     No orders of the defined types were placed in this encounter.   All questions were answered. The patient knows to call the clinic with any problems, questions or concerns. No barriers to learning was detected. I spent 15 minutes counseling the patient face to face. The total time spent in the appointment was 20 minutes and more than 50% was on counseling and review of test results     [facility], Rutledge Selsor, MD 09/20/2014 11:15 AM

## 2014-09-20 NOTE — Assessment & Plan Note (Signed)
This is likely due to recent treatment. The patient denies recent history of bleeding such as epistaxis, hematuria or hematochezia. He is asymptomatic from the anemia. I will observe for now.    

## 2014-09-20 NOTE — Assessment & Plan Note (Signed)
This is likely due to recent treatment and possible side-effects of recent addition of amiodarone. The patient denies recent history of bleeding such as epistaxis, hematuria or hematochezia. He is asymptomatic from the low platelet count.

## 2014-09-20 NOTE — Telephone Encounter (Signed)
gv and prnted appt sched and avs for pt for June

## 2014-09-20 NOTE — Assessment & Plan Note (Signed)
The patient has JAK2 mutation positive myeloproliferative disorder. He tolerated the hydroxyurea well.  After recent dose reduction, he tolerated hydroxyurea 2 tablets daily.  I will see him back in 3 months.

## 2014-09-20 NOTE — Assessment & Plan Note (Signed)
The patient denies any recent signs or symptoms of bleeding such as spontaneous epistaxis, hematuria or hematochezia.

## 2014-09-24 ENCOUNTER — Ambulatory Visit (INDEPENDENT_AMBULATORY_CARE_PROVIDER_SITE_OTHER): Payer: Medicare Other | Admitting: Pharmacist

## 2014-09-24 ENCOUNTER — Encounter: Payer: Self-pay | Admitting: Internal Medicine

## 2014-09-24 DIAGNOSIS — Z7901 Long term (current) use of anticoagulants: Secondary | ICD-10-CM

## 2014-09-24 DIAGNOSIS — I4891 Unspecified atrial fibrillation: Secondary | ICD-10-CM

## 2014-09-24 LAB — POCT INR: INR: 1.6

## 2014-09-24 MED ORDER — WARFARIN SODIUM 5 MG PO TABS: 5.0000 mg | ORAL_TABLET | Freq: Every day | ORAL | Status: DC

## 2014-09-24 NOTE — Progress Notes (Signed)
CLINICAL PHARMACY ANTICOAGULATION MANAGEMENT [name] is a 69 y.o. male who reports to the clinic for monitoring of warfarin treatment.    Indication: atrial fibrillation Duration: indefinite  Anticoagulation Clinic Visit History: Anticoagulation Episode Summary    Current INR goal 2.0-3.0  Next INR check 10/08/2014  INR from last check 1.6! (09/24/2014)  Weekly max dose   Target end date Indefinite  [facility]  Preferred lab   Send INR reminders to    Indications  Atrial fibrillation [I48.91]        Comments        ASSESSMENT Recent Results: Recent results are below, the most recent result is correlated with a dose of 30 mg per week: Lab Results  Component Value Date   INR 1.6 09/24/2014   INR 1.3 09/17/2014   INR 1.20 09/04/2014    INR today: Subtherapeutic Findings: otherwise negative  Anticoagulation Dosing: INR as of 09/24/2014 and Previous Dosing Information    INR Dt INR Goal [name] [name]   09/24/2014 1.6 2.0-3.0 30 mg 5 mg 2.5 mg 5 mg 5 mg 5 mg 2.5 mg 5 mg    Anticoagulation Dose Instructions as of 09/24/2014      Total [name]   New Dose 35 mg 5 mg 5 mg 5 mg 5 mg 5 mg 5 mg 5 mg     (5 mg x 1)  (5 mg x 1)  (5 mg x 1)  (5 mg x 1)  (5 mg x 1)  (5 mg x 1)  (5 mg x 1)                           PLAN Weekly dose was increased by 16% to 35 mg per week  Patient Instructions: Patient Instructions  Patient educated about medication as defined in this encounter and verbalized understanding by repeating back instructions provided.     Follow-up Return in about 2 weeks (around 10/08/2014) for Follow up INR on 10/08/14 at 10.  Flossie Dibble, PharmD BCPS, BCACP

## 2014-09-24 NOTE — Patient Instructions (Signed)
Patient educated about medication as defined in this encounter and verbalized understanding by repeating back instructions provided.   

## 2014-09-28 ENCOUNTER — Ambulatory Visit (INDEPENDENT_AMBULATORY_CARE_PROVIDER_SITE_OTHER): Payer: Medicare Other | Admitting: *Deleted

## 2014-09-28 DIAGNOSIS — R55 Syncope and collapse: Secondary | ICD-10-CM | POA: Diagnosis not present

## 2014-09-28 LAB — MDC_IDC_ENUM_SESS_TYPE_REMOTE

## 2014-10-04 NOTE — Progress Notes (Signed)
Loop recorder 

## 2014-10-08 ENCOUNTER — Ambulatory Visit (INDEPENDENT_AMBULATORY_CARE_PROVIDER_SITE_OTHER): Payer: Medicare Other | Admitting: Pharmacist

## 2014-10-08 DIAGNOSIS — Z7901 Long term (current) use of anticoagulants: Secondary | ICD-10-CM

## 2014-10-08 DIAGNOSIS — I4891 Unspecified atrial fibrillation: Secondary | ICD-10-CM | POA: Diagnosis present

## 2014-10-08 LAB — POCT INR: INR: 6.2

## 2014-10-08 NOTE — Patient Instructions (Signed)
Patient instructed to take medications as defined in the Anti-coagulation Track section of this encounter.  Patient instructed to OMIT TUESDAYS dose (patient had already taken today's dose, Monday 21-MAR-16)  Patient verbalized understanding of these instructions.

## 2014-10-08 NOTE — Progress Notes (Signed)
Anti-Coagulation Progress Note  [name] is a 70 y.o. male who is currently on an anti-coagulation regimen.    RECENT RESULTS: Recent results are below, the most recent result is correlated with a dose of 35 mg. per week: Lab Results  Component Value Date   INR 6.20 10/08/2014   INR 1.6 09/24/2014   INR 1.3 09/17/2014    ANTI-COAG DOSE: Anticoagulation Dose Instructions as of 10/08/2014      [name] Tue Wed Thu Fri Sat   New Dose 2.5 mg 5 mg 5 mg 2.5 mg 5 mg 5 mg 5 mg       ANTICOAG SUMMARY: Anticoagulation Episode Summary    Current INR goal 2.0-3.0  Next INR check 10/15/2014  INR from last check 6.20! (10/08/2014)  Weekly max dose   Target end date Indefinite  [facility]  Preferred lab   Send INR reminders to    Indications  Atrial fibrillation [I48.91]        Comments         ANTICOAG TODAY: Anticoagulation Summary as of 10/08/2014    INR goal 2.0-3.0  Selected INR 6.20! (10/08/2014)  Next INR check 10/15/2014  Target end date Indefinite   Indications  Atrial fibrillation [I48.91]      Anticoagulation Episode Summary    [facility] check [facility]   Preferred lab    Send INR reminders to    Comments       PATIENT INSTRUCTIONS: Patient Instructions  Patient instructed to take medications as defined in the Anti-coagulation Track section of this encounter.  Patient instructed to OMIT TUESDAYS dose (patient had already taken today's dose, Monday 21-MAR-16)  Patient verbalized understanding of these instructions.       FOLLOW-UP Return in 7 days (on 10/15/2014) for Follow up INR at 0945h.  [name], III Pharm.D., CACP

## 2014-10-15 ENCOUNTER — Ambulatory Visit (INDEPENDENT_AMBULATORY_CARE_PROVIDER_SITE_OTHER): Payer: Medicare Other | Admitting: Pharmacist

## 2014-10-15 DIAGNOSIS — I4891 Unspecified atrial fibrillation: Secondary | ICD-10-CM

## 2014-10-15 DIAGNOSIS — Z7901 Long term (current) use of anticoagulants: Secondary | ICD-10-CM

## 2014-10-15 LAB — POCT INR: INR: 6.1

## 2014-10-15 NOTE — Progress Notes (Signed)
Indication: Atrial fibrillation. Duration: Lifelong. INR: Above target. Agree with Dr. Julianne Rice assessment and plan.

## 2014-10-15 NOTE — Progress Notes (Signed)
CLINICAL PHARMACY ANTICOAGULATION MANAGEMENT [name] is a 69 y.o. male who reports to the clinic for monitoring of warfarin treatment.    Indication: atrial fibrillation Duration: indefinite  Anticoagulation Clinic Visit History: Anticoagulation Episode Summary    Current INR goal 2.0-3.0  Next INR check 10/18/2014  INR from last check 6.1! (10/15/2014)  Weekly max dose   Target end date Indefinite  [facility]  Preferred lab   Send INR reminders to    Indications  Atrial fibrillation [I48.91]        Comments        ASSESSMENT Recent Results: Recent results are below, the most recent result is correlated with a dose of 30 mg per week: Lab Results  Component Value Date   INR 6.1 10/15/2014   INR 6.20 10/08/2014   INR 1.6 09/24/2014    INR today: Supratherapeutic  Anticoagulation Dosing: INR as of 10/15/2014 and Previous Dosing Information    INR Dt INR Goal Wkly Tot [name]   10/15/2014 6.1 2.0-3.0 30 mg 2.5 mg 5 mg 5 mg 2.5 mg 5 mg 5 mg 5 mg      PLAN Hold x 2 doses then return to clinic for INR check and dosing instructions.  Patient Instructions  Patient educated about medication as defined in this encounter and verbalized understanding by repeating back instructions provided.     Follow-up Return in about 3 days (around 10/18/2014) for Follow up INR 10/18/14 at 1:30 with Mannie Stabile.  Flossie Dibble, PharmD BCPS, BCACP

## 2014-10-15 NOTE — Patient Instructions (Signed)
Patient educated about medication as defined in this encounter and verbalized understanding by repeating back instructions provided.   

## 2014-10-18 ENCOUNTER — Ambulatory Visit (INDEPENDENT_AMBULATORY_CARE_PROVIDER_SITE_OTHER): Payer: Medicare Other | Admitting: Pharmacist

## 2014-10-18 DIAGNOSIS — I4891 Unspecified atrial fibrillation: Secondary | ICD-10-CM

## 2014-10-18 LAB — POCT INR: INR: 2.4

## 2014-10-18 NOTE — Progress Notes (Signed)
Indication: Atrial fibrillation. Duration: Lifelong. INR: At target. Agree with Dr. Julianne Rice assessment and plan.

## 2014-10-18 NOTE — Progress Notes (Signed)
CLINICAL PHARMACY ANTICOAGULATION MANAGEMENT [name] is a 69 y.o. male who reports to the clinic for monitoring of warfarin treatment.    Indication: atrial fibrillation Duration: indefinite  Anticoagulation Clinic Visit History: Anticoagulation Episode Summary    Current INR goal 2.0-3.0  Next INR check 10/29/2014  INR from last check 2.4 (10/18/2014)  Weekly max dose   Target end date Indefinite  [facility]  Preferred lab   Send INR reminders to    Indications  Atrial fibrillation [I48.91]        Comments        ASSESSMENT Recent Results: Recent results are below, the most recent result is correlated with a dose of 22.5 mg per week: Lab Results  Component Value Date   INR 2.4 10/18/2014   INR 6.1 10/15/2014   INR 6.20 10/08/2014    INR today: Therapeutic  Anticoagulation Dosing: INR as of 10/18/2014 and Previous Dosing Information    INR Dt INR Goal Wkly Tot [name]   10/18/2014 2.4 2.0-3.0 22.5 mg 2.5 mg 5 mg 0 mg 0 mg 5 mg 5 mg 5 mg   Patient deviated from recommended dosing.       Previous description        Hold x 2 doses then return to clinic    Anticoagulation Dose Instructions as of 10/18/2014      Total [name]   New Dose 22.5 mg 2.5 mg 5 mg 2.5 mg 2.5 mg 2.5 mg 5 mg 2.5 mg     (5 mg x 0.5)  (5 mg x 1)  (5 mg x 0.5)  (5 mg x 0.5)  (5 mg x 0.5)  (5 mg x 1)  (5 mg x 0.5)                           PLAN Weekly dose was unchanged  Patient Instructions  Patient educated about medication as defined in this encounter and verbalized understanding by repeating back instructions provided.     Follow-up Return in about 11 days (around 10/29/2014) for Follow up INR 10/29/14 at 9 am.  Flossie Dibble, PharmD BCPS, BCACP

## 2014-10-18 NOTE — Patient Instructions (Signed)
Patient educated about medication as defined in this encounter and verbalized understanding by repeating back instructions provided.   

## 2014-10-22 ENCOUNTER — Ambulatory Visit: Payer: Medicare Other | Admitting: Pharmacist

## 2014-10-25 ENCOUNTER — Other Ambulatory Visit: Payer: Self-pay | Admitting: *Deleted

## 2014-10-25 DIAGNOSIS — I4891 Unspecified atrial fibrillation: Secondary | ICD-10-CM

## 2014-10-25 MED ORDER — AMIODARONE HCL 200 MG PO TABS: 200.0000 mg | ORAL_TABLET | Freq: Every day | ORAL | Status: DC

## 2014-10-27 IMAGING — CT CT ANGIO CHEST
2 of 8 series · 17 of 36 positions shown · IV contrast (CONTRAST)
Comparison: Chest radiograph August 22, 2013

CLINICAL DATA: Difficulty breathing/respiratory distress

EXAM:
CT ANGIOGRAPHY CHEST WITH CONTRAST
TECHNIQUE: Multidetector CT imaging of the chest was performed using the
standard protocol during bolus administration of intravenous
contrast. Multiplanar CT image reconstructions including MIPs were
obtained to evaluate the vascular anatomy.
CONTRAST:  80 mL OMNIPAQUE IOHEXOL 350 MG/ML SOLN

[Series 6: pe thins · axial · 0.68mm/px · z∈[-347,-96]mm · 16 of 285 slices shown]
[im 17/285  lung]
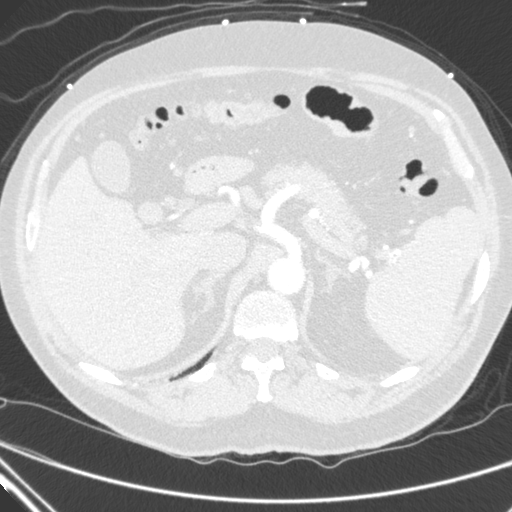
[im 34/285  mediastinal]
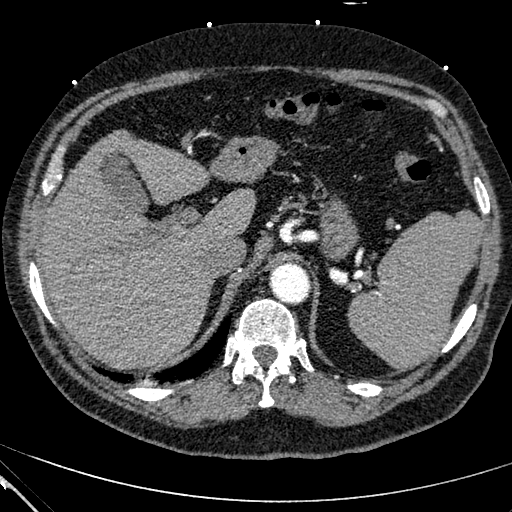
[im 51/285  lung]
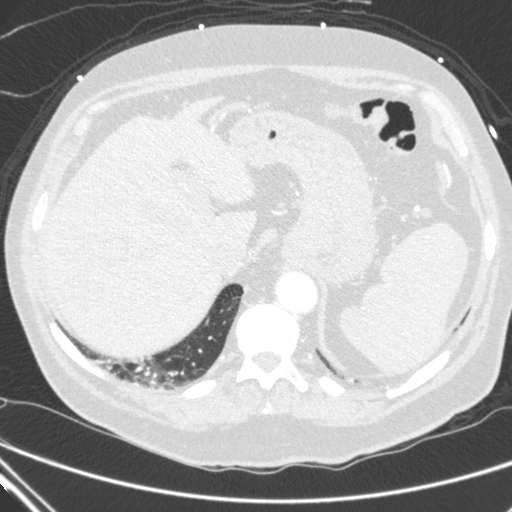
[im 67/285  mediastinal]
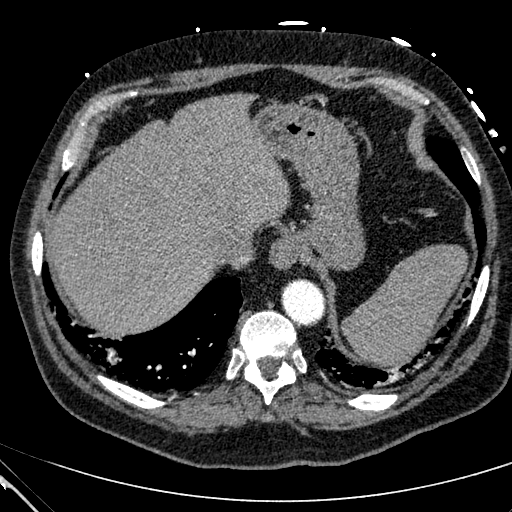
[im 84/285  lung]
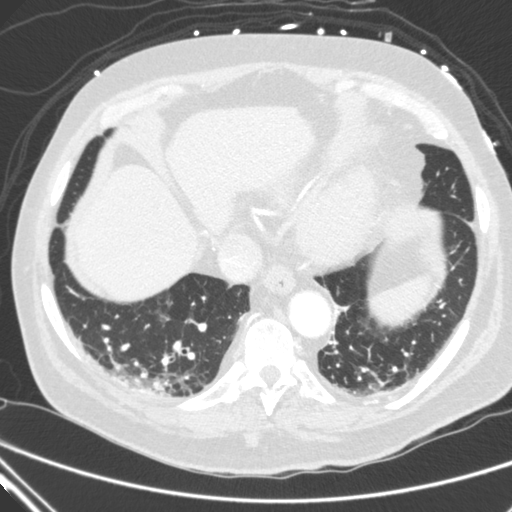
[im 101/285  mediastinal]
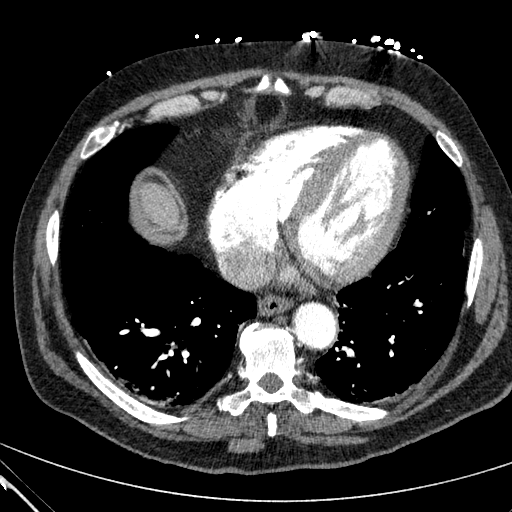
[im 117/285  lung]
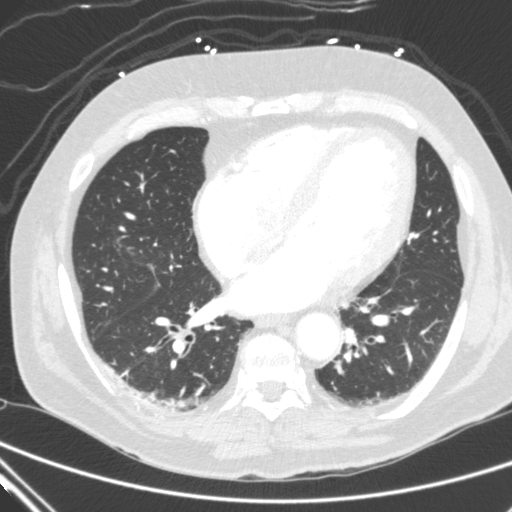
[im 134/285  mediastinal]
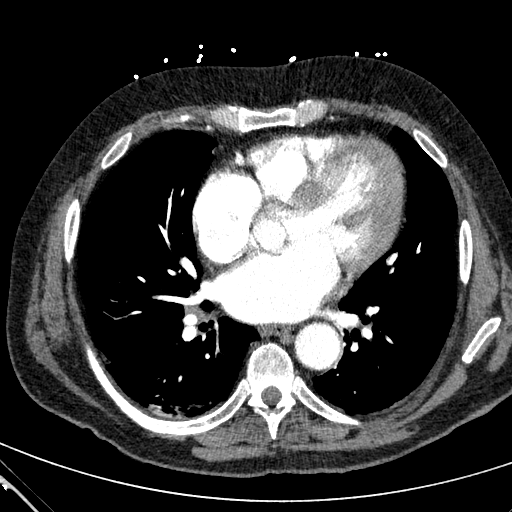
[im 151/285  lung]
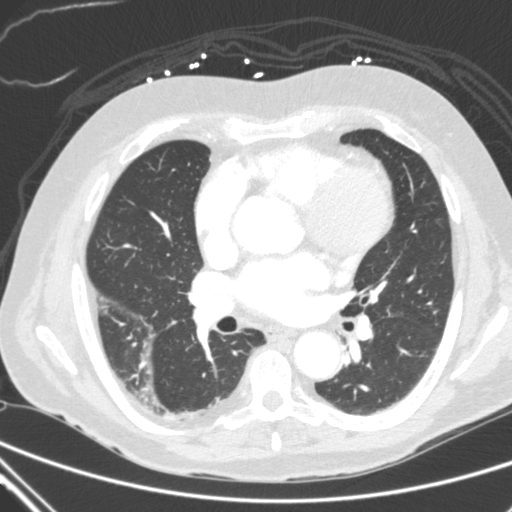
[im 168/285  mediastinal]
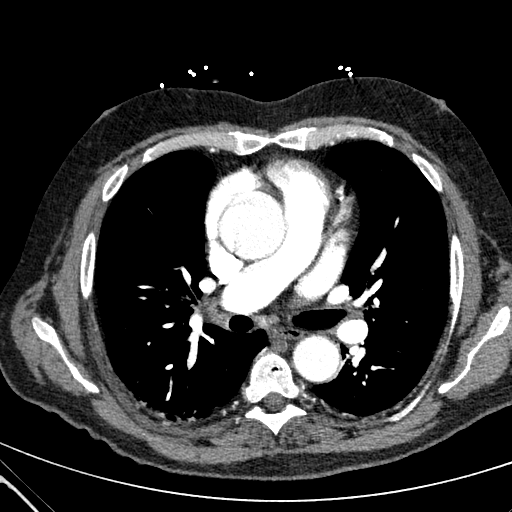
[im 184/285  lung]
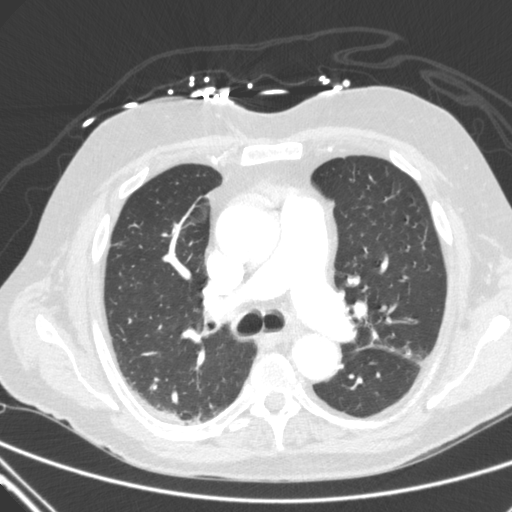
[im 201/285  mediastinal]
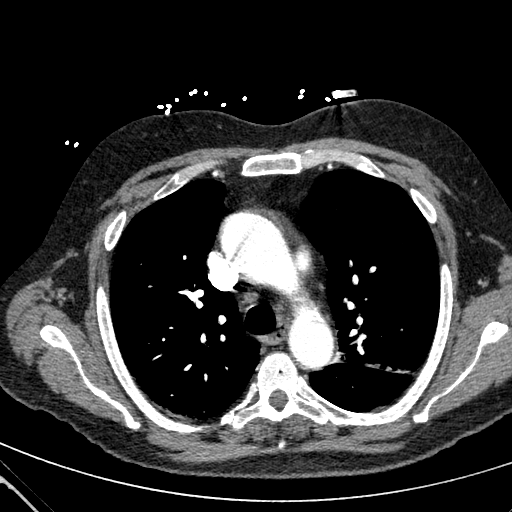
[im 218/285  lung]
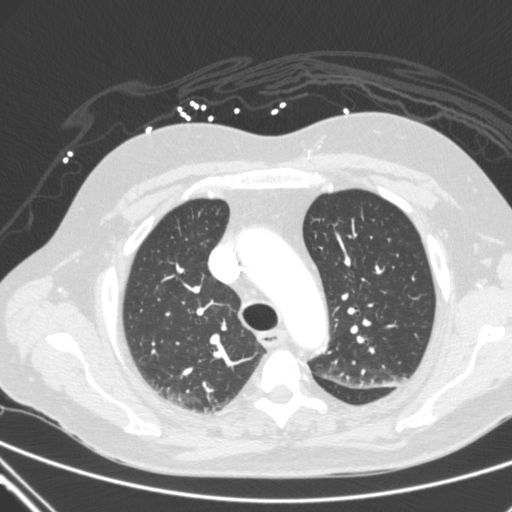
[im 234/285  mediastinal]
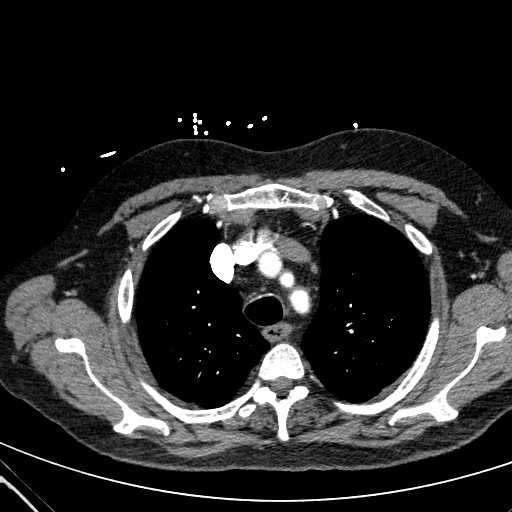
[im 251/285  lung]
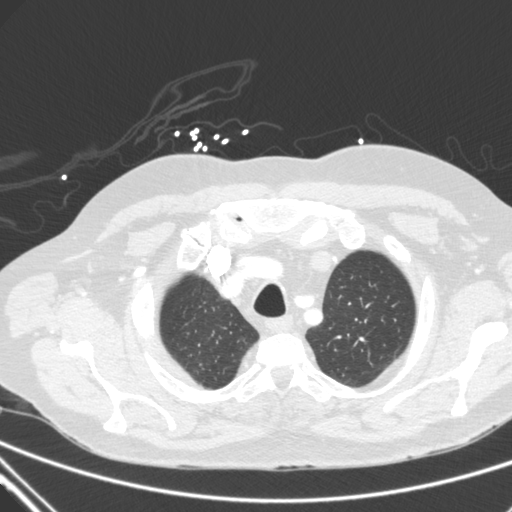
[im 268/285  mediastinal]
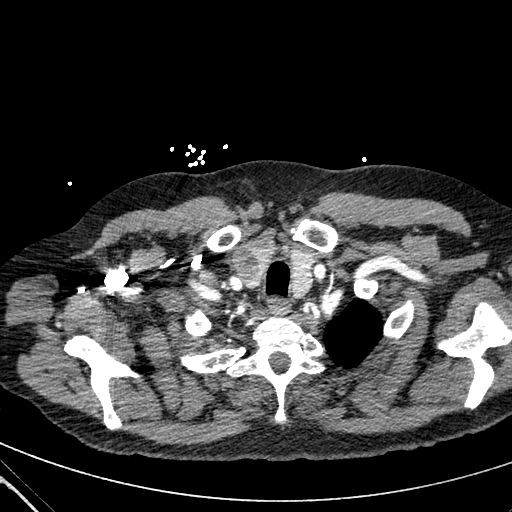

[cor · coronal · 0.68mm/px · 1 of 131 slices shown]
[im 66/131  mediastinal]
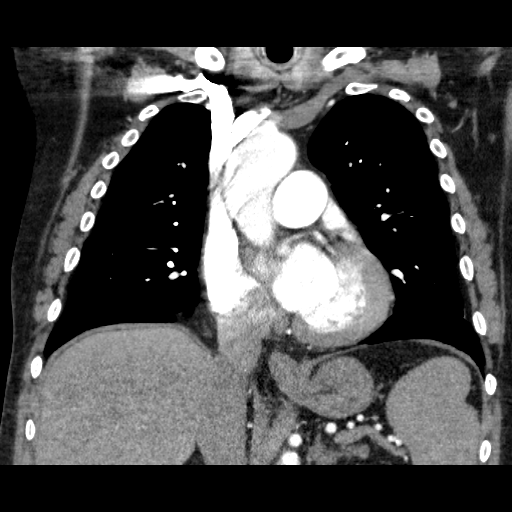

[17 of 36 positions shown; findings below may reference images not displayed]

FINDINGS: There is no demonstrable pulmonary embolus. The aorta is somewhat
diffusely ectatic. The ascending thoracic aorta is enlarged
measuring 4.6 x 4.6 cm. There is no thoracic aortic dissection.

There is underlying centrilobular emphysema. There is lower lobe
bronchiectatic change bilaterally.

There is patchy atelectatic change in both lower lobes, more on the
right than on the left. There is mild atelectasis in the posterior
aspects of both upper lobes as well. There is no well-defined
airspace consolidation.

There is no appreciable thoracic adenopathy. There is left
ventricular hypertrophy. Pericardium is not thickened. There is a
small hiatal hernia.

Visualized upper abdominal structures appear unremarkable. There are
no blastic or lytic bone lesions. There is a dominant mass in the
right lobe of the thyroid measuring 2.1 x 1.9 cm.

Review of the MIP images confirms the above findings.
IMPRESSION: No demonstrable pulmonary embolus.

There is enlargement of the ascending thoracic aorta. Indeed, the
entire aorta is mildly ectatic. No dissection.

Areas of underlying emphysematous change. Lower lobe bronchiectatic
change is noted bilaterally. There is patchy atelectasis in both
upper and lower lobes, most notably in the right lower lobe. No
well-defined consolidation.

There is a dominant mass in the right lobe of the thyroid.
Nonemergent thyroid ultrasound advised to further assess.

## 2014-10-29 ENCOUNTER — Ambulatory Visit (INDEPENDENT_AMBULATORY_CARE_PROVIDER_SITE_OTHER): Payer: Medicare Other | Admitting: *Deleted

## 2014-10-29 ENCOUNTER — Ambulatory Visit (INDEPENDENT_AMBULATORY_CARE_PROVIDER_SITE_OTHER): Payer: Medicare Other | Admitting: Pharmacist

## 2014-10-29 DIAGNOSIS — R55 Syncope and collapse: Secondary | ICD-10-CM

## 2014-10-29 DIAGNOSIS — Z7901 Long term (current) use of anticoagulants: Secondary | ICD-10-CM | POA: Diagnosis not present

## 2014-10-29 DIAGNOSIS — I4891 Unspecified atrial fibrillation: Secondary | ICD-10-CM

## 2014-10-29 LAB — POCT INR: INR: 3

## 2014-10-29 NOTE — Patient Instructions (Signed)
Patient instructed to take medications as defined in the Anti-coagulation Track section of this encounter.  Patient instructed to take today's dose.  Patient verbalized understanding of these instructions.    

## 2014-10-29 NOTE — Progress Notes (Signed)
Anti-Coagulation Progress Note  [name] is a 68 y.o. male who is currently on an anti-coagulation regimen.    RECENT RESULTS: Recent results are below, the most recent result is correlated with a dose of 22.5 mg. per week: Lab Results  Component Value Date   INR 3.00 10/29/2014   INR 2.4 10/18/2014   INR 6.1 10/15/2014    ANTI-COAG DOSE: Anticoagulation Dose Instructions as of 10/29/2014      [name] Tue Wed Thu Fri Sat   New Dose 2.5 mg 5 mg 2.5 mg 2.5 mg 2.5 mg 2.5 mg 2.5 mg       ANTICOAG SUMMARY: Anticoagulation Episode Summary    Current INR goal 2.0-3.0  Next INR check 11/12/2014  INR from last check 3.00 (10/29/2014)  Weekly max dose   Target end date Indefinite  [facility]  Preferred lab   Send INR reminders to    Indications  Atrial fibrillation [I48.91]        Comments         ANTICOAG TODAY: Anticoagulation Summary as of 10/29/2014    INR goal 2.0-3.0  Selected INR 3.00 (10/29/2014)  Next INR check 11/12/2014  Target end date Indefinite   Indications  Atrial fibrillation [I48.91]      Anticoagulation Episode Summary    [facility]   Preferred lab    Send INR reminders to    Comments       PATIENT INSTRUCTIONS: Patient Instructions  Patient instructed to take medications as defined in the Anti-coagulation Track section of this encounter.  Patient instructed to take today's dose.  Patient verbalized understanding of these instructions.       FOLLOW-UP Return in 2 weeks (on 11/12/2014) for Follow up INR at 0945h.  [name], III Pharm.D., CACP

## 2014-10-30 ENCOUNTER — Encounter (HOSPITAL_COMMUNITY): Payer: Self-pay | Admitting: Emergency Medicine

## 2014-10-30 ENCOUNTER — Other Ambulatory Visit: Payer: Self-pay

## 2014-10-30 ENCOUNTER — Emergency Department (HOSPITAL_COMMUNITY): Payer: Medicare Other

## 2014-10-30 ENCOUNTER — Emergency Department (HOSPITAL_COMMUNITY)
Admission: EM | Admit: 2014-10-30 | Discharge: 2014-10-30 | Disposition: A | Discharge status: Home / Self Care | Payer: Medicare Other | Attending: Emergency Medicine | Admitting: Emergency Medicine

## 2014-10-30 DIAGNOSIS — J441 Chronic obstructive pulmonary disease with (acute) exacerbation: Secondary | ICD-10-CM | POA: Diagnosis not present

## 2014-10-30 DIAGNOSIS — E669 Obesity, unspecified: Secondary | ICD-10-CM | POA: Insufficient documentation

## 2014-10-30 DIAGNOSIS — Z7951 Long term (current) use of inhaled steroids: Secondary | ICD-10-CM | POA: Insufficient documentation

## 2014-10-30 DIAGNOSIS — R0602 Shortness of breath: Secondary | ICD-10-CM | POA: Diagnosis present

## 2014-10-30 DIAGNOSIS — Z720 Tobacco use: Secondary | ICD-10-CM | POA: Diagnosis not present

## 2014-10-30 DIAGNOSIS — J449 Chronic obstructive pulmonary disease, unspecified: Secondary | ICD-10-CM

## 2014-10-30 DIAGNOSIS — Z87448 Personal history of other diseases of urinary system: Secondary | ICD-10-CM | POA: Insufficient documentation

## 2014-10-30 DIAGNOSIS — Z862 Personal history of diseases of the blood and blood-forming organs and certain disorders involving the immune mechanism: Secondary | ICD-10-CM | POA: Insufficient documentation

## 2014-10-30 DIAGNOSIS — Z7901 Long term (current) use of anticoagulants: Secondary | ICD-10-CM | POA: Insufficient documentation

## 2014-10-30 DIAGNOSIS — Z859 Personal history of malignant neoplasm, unspecified: Secondary | ICD-10-CM | POA: Diagnosis not present

## 2014-10-30 DIAGNOSIS — I10 Essential (primary) hypertension: Secondary | ICD-10-CM | POA: Insufficient documentation

## 2014-10-30 DIAGNOSIS — Z79899 Other long term (current) drug therapy: Secondary | ICD-10-CM | POA: Diagnosis not present

## 2014-10-30 DIAGNOSIS — Z8719 Personal history of other diseases of the digestive system: Secondary | ICD-10-CM | POA: Insufficient documentation

## 2014-10-30 DIAGNOSIS — Z8579 Personal history of other malignant neoplasms of lymphoid, hematopoietic and related tissues: Secondary | ICD-10-CM | POA: Insufficient documentation

## 2014-10-30 DIAGNOSIS — I509 Heart failure, unspecified: Secondary | ICD-10-CM | POA: Insufficient documentation

## 2014-10-30 LAB — CBC WITH DIFFERENTIAL/PLATELET
Basophils Absolute: 0 10*3/uL (ref 0.0–0.1)
Basophils Relative: 0 % (ref 0–1)
EOS PCT: 1 % (ref 0–5)
Eosinophils Absolute: 0 10*3/uL (ref 0.0–0.7)
HEMATOCRIT: 38.1 % — AB (ref 39.0–52.0)
HEMOGLOBIN: 12.8 g/dL — AB (ref 13.0–17.0)
Lymphocytes Relative: 7 % — ABNORMAL LOW (ref 12–46)
Lymphs Abs: 0.3 10*3/uL — ABNORMAL LOW (ref 0.7–4.0)
MCH: 43 pg — ABNORMAL HIGH (ref 26.0–34.0)
MCHC: 33.6 g/dL (ref 30.0–36.0)
MCV: 127.9 fL — ABNORMAL HIGH (ref 78.0–100.0)
MONOS PCT: 5 % (ref 3–12)
Monocytes Absolute: 0.2 10*3/uL (ref 0.1–1.0)
NEUTROS ABS: 4.1 10*3/uL (ref 1.7–7.7)
Neutrophils Relative %: 87 % — ABNORMAL HIGH (ref 43–77)
Platelets: 138 10*3/uL — ABNORMAL LOW (ref 150–400)
RBC: 2.98 MIL/uL — AB (ref 4.22–5.81)
RDW: 13.4 % (ref 11.5–15.5)
WBC: 4.6 10*3/uL (ref 4.0–10.5)

## 2014-10-30 LAB — BASIC METABOLIC PANEL
Anion gap: 11 (ref 5–15)
BUN: 26 mg/dL — ABNORMAL HIGH (ref 6–23)
CO2: 30 mmol/L (ref 19–32)
CREATININE: 1.44 mg/dL — AB (ref 0.50–1.35)
Calcium: 8.8 mg/dL (ref 8.4–10.5)
Chloride: 99 mmol/L (ref 96–112)
GFR, EST AFRICAN AMERICAN: 57 mL/min — AB (ref >90)
GFR, EST NON AFRICAN AMERICAN: 49 mL/min — AB (ref >90)
Glucose, Bld: 98 mg/dL (ref 70–99)
POTASSIUM: 4.2 mmol/L (ref 3.5–5.1)
Sodium: 140 mmol/L (ref 135–145)

## 2014-10-30 LAB — I-STAT TROPONIN, ED: Troponin i, poc: 0 ng/mL (ref 0.00–0.08)

## 2014-10-30 MED ORDER — ALBUTEROL SULFATE (2.5 MG/3ML) 0.083% IN NEBU
2.5000 mg | INHALATION_SOLUTION | Freq: Once | RESPIRATORY_TRACT | Status: AC
  Administered 2014-10-30: 2.5 mg via RESPIRATORY_TRACT
  Filled 2014-10-30: qty 3

## 2014-10-30 MED ORDER — ALBUTEROL SULFATE (2.5 MG/3ML) 0.083% IN NEBU: 10.0000 mg | INHALATION_SOLUTION | Freq: Once | RESPIRATORY_TRACT | Status: DC

## 2014-10-30 MED ORDER — IPRATROPIUM-ALBUTEROL 0.5-2.5 (3) MG/3ML IN SOLN
3.0000 mL | Freq: Once | RESPIRATORY_TRACT | Status: AC
  Administered 2014-10-30: 3 mL via RESPIRATORY_TRACT
  Filled 2014-10-30: qty 3

## 2014-10-30 MED ORDER — IPRATROPIUM BROMIDE 0.02 % IN SOLN: 0.5000 mg | Freq: Once | RESPIRATORY_TRACT | Status: DC

## 2014-10-30 NOTE — Discharge Instructions (Signed)
Chronic Obstructive Pulmonary Disease °Chronic obstructive pulmonary disease (COPD) is a common lung condition in which airflow from the lungs is limited. COPD is a general term that can be used to describe many different lung problems that limit airflow, including both chronic bronchitis and emphysema.  If you have COPD, your lung function will probably never return to normal, but there are measures you can take to improve lung function and make yourself feel better.  °CAUSES  °· Smoking (common).   °· Exposure to secondhand smoke.   °· Genetic problems. °· Chronic inflammatory lung diseases or recurrent infections. °SYMPTOMS  °· Shortness of breath, especially with physical activity.   °· Deep, persistent (chronic) cough with a large amount of thick mucus.   °· Wheezing.   °· Rapid breaths (tachypnea).   °· Gray or bluish discoloration (cyanosis) of the skin, especially in fingers, toes, or lips.   °· Fatigue.   °· Weight loss.   °· Frequent infections or episodes when breathing symptoms become much worse (exacerbations).   °· Chest tightness. °DIAGNOSIS  °Your health care provider will take a medical history and perform a physical examination to make the initial diagnosis.  Additional tests for COPD may include:  °· Lung (pulmonary) function tests. °· Chest X-ray. °· CT scan. °· Blood tests. °TREATMENT  °Treatment available to help you feel better when you have COPD includes:  °· Inhaler and nebulizer medicines. These help manage the symptoms of COPD and make your breathing more comfortable. °· Supplemental oxygen. Supplemental oxygen is only helpful if you have a low oxygen level in your blood.   °· Exercise and physical activity. These are beneficial for nearly all people with COPD. Some people may also benefit from a pulmonary rehabilitation program. °HOME CARE INSTRUCTIONS  °· Take all medicines (inhaled or pills) as directed by your health care provider. °· Avoid over-the-counter medicines or cough syrups  that dry up your airway (such as antihistamines) and slow down the elimination of secretions unless instructed otherwise by your health care provider.   °· If you are a smoker, the most important thing that you can do is stop smoking. Continuing to smoke will cause further lung damage and breathing trouble. Ask your health care provider for help with quitting smoking. He or she can direct you to community resources or hospitals that provide support. °· Avoid exposure to irritants such as smoke, chemicals, and fumes that aggravate your breathing. °· Use oxygen therapy and pulmonary rehabilitation if directed by your health care provider. If you require home oxygen therapy, ask your health care provider whether you should purchase a pulse oximeter to measure your oxygen level at home.   °· Avoid contact with individuals who have a contagious illness. °· Avoid extreme temperature and humidity changes. °· Eat healthy foods. Eating smaller, more frequent meals and resting before meals may help you maintain your strength. °· Stay active, but balance activity with periods of rest. Exercise and physical activity will help you maintain your ability to do things you want to do. °· Preventing infection and hospitalization is very important when you have COPD. Make sure to receive all the vaccines your health care provider recommends, especially the pneumococcal and influenza vaccines. Ask your health care provider whether you need a pneumonia vaccine. °· Learn and use relaxation techniques to manage stress. °· Learn and use controlled breathing techniques as directed by your health care provider. Controlled breathing techniques include:   °· Pursed lip breathing. Start by breathing in (inhaling) through your nose for 1 second. Then, purse your lips as if you were   going to whistle and breathe out (exhale) through the pursed lips for 2 seconds.   °· Diaphragmatic breathing. Start by putting one hand on your abdomen just above  your waist. Inhale slowly through your nose. The hand on your abdomen should move out. Then purse your lips and exhale slowly. You should be able to feel the hand on your abdomen moving in as you exhale.   °· Learn and use controlled coughing to clear mucus from your lungs. Controlled coughing is a series of short, progressive coughs. The steps of controlled coughing are:   °· Lean your head slightly forward.   °· Breathe in deeply using diaphragmatic breathing.   °· Try to hold your breath for 3 seconds.   °· Keep your mouth slightly open while coughing twice.   °· Spit any mucus out into a tissue.   °· Rest and repeat the steps once or twice as needed. °SEEK MEDICAL CARE IF:  °· You are coughing up more mucus than usual.   °· There is a change in the color or thickness of your mucus.   °· Your breathing is more labored than usual.   °· Your breathing is faster than usual.   °SEEK IMMEDIATE MEDICAL CARE IF:  °· You have shortness of breath while you are resting.   °· You have shortness of breath that prevents you from: °· Being able to talk.   °· Performing your usual physical activities.   °· You have chest pain lasting longer than 5 minutes.   °· Your skin color is more cyanotic than usual. °· You measure low oxygen saturations for longer than 5 minutes with a pulse oximeter. °MAKE SURE YOU:  °· Understand these instructions. °· Will watch your condition. °· Will get help right away if you are not doing well or get worse. °Document Released: 04/15/2005 Document Revised: 11/20/2013 Document Reviewed: 03/02/2013 °[employer]® Patient Information ©2015 [employer]. This information is not intended to replace advice given to you by your health care provider. Make sure you discuss any questions you have with your health care provider. °Smoking Cessation °Quitting smoking is important to your health and has many advantages. However, it is not always easy to quit since nicotine is a very addictive drug. Oftentimes,  people try 3 times or more before being able to quit. This document explains the best ways for you to prepare to quit smoking. Quitting takes hard work and a lot of effort, but you can do it. °ADVANTAGES OF QUITTING SMOKING °· You will live longer, feel better, and live better. °· Your body will feel the impact of quitting smoking almost immediately. °¨ Within 20 minutes, blood pressure decreases. Your pulse returns to its normal level. °¨ After 8 hours, carbon monoxide levels in the blood return to normal. Your oxygen level increases. °¨ After 24 hours, the chance of having a heart attack starts to decrease. Your breath, hair, and body stop smelling like smoke. °¨ After 48 hours, damaged nerve endings begin to recover. Your sense of taste and smell improve. °¨ After 72 hours, the body is virtually free of nicotine. Your bronchial tubes relax and breathing becomes easier. °¨ After 2 to 12 weeks, lungs can hold more air. Exercise becomes easier and circulation improves. °· The risk of having a heart attack, stroke, cancer, or lung disease is greatly reduced. °¨ After 1 year, the risk of coronary heart disease is cut in half. °¨ After 5 years, the risk of stroke falls to the same as a nonsmoker. °¨ After 10 years, the risk of lung cancer is   cut in half and the risk of other cancers decreases significantly. °¨ After 15 years, the risk of coronary heart disease drops, usually to the level of a nonsmoker. °· If you are pregnant, quitting smoking will improve your chances of having a healthy baby. °· The people you live with, especially any children, will be healthier. °· You will have extra money to spend on things other than cigarettes. °QUESTIONS TO THINK ABOUT BEFORE ATTEMPTING TO QUIT °You may want to talk about your answers with your health care provider. °· Why do you want to quit? °· If you tried to quit in the past, what helped and what did not? °· What will be the most difficult situations for you after you  quit? How will you plan to handle them? °· Who can help you through the tough times? Your family? Friends? A health care provider? °· What pleasures do you get from smoking? What ways can you still get pleasure if you quit? °Here are some questions to ask your health care provider: °· How can you help me to be successful at quitting? °· What medicine do you think would be best for me and how should I take it? °· What should I do if I need more help? °· What is smoking withdrawal like? How can I get information on withdrawal? °GET READY °· Set a quit date. °· Change your environment by getting rid of all cigarettes, ashtrays, matches, and lighters in your home, car, or work. Do not let people smoke in your home. °· Review your past attempts to quit. Think about what worked and what did not. °GET SUPPORT AND ENCOURAGEMENT °You have a better chance of being successful if you have help. You can get support in many ways. °· Tell your family, friends, and coworkers that you are going to quit and need their support. Ask them not to smoke around you. °· Get individual, group, or telephone counseling and support. Programs are available at local hospitals and health centers. Call your local health department for information about programs in your area. °· Spiritual beliefs and practices may help some smokers quit. °· Download a "quit meter" on your computer to keep track of quit statistics, such as how long you have gone without smoking, cigarettes not smoked, and money saved. °· Get a self-help book about quitting smoking and staying off tobacco. °LEARN NEW SKILLS AND BEHAVIORS °· Distract yourself from urges to smoke. Talk to someone, go for a walk, or occupy your time with a task. °· Change your normal routine. Take a different route to work. Drink tea instead of coffee. Eat breakfast in a different place. °· Reduce your stress. Take a hot bath, exercise, or read a book. °· Plan something enjoyable to do every day. Reward  yourself for not smoking. °· Explore interactive web-based programs that specialize in helping you quit. °GET MEDICINE AND USE IT CORRECTLY °Medicines can help you stop smoking and decrease the urge to smoke. Combining medicine with the above behavioral methods and support can greatly increase your chances of successfully quitting smoking. °· Nicotine replacement therapy helps deliver nicotine to your body without the negative effects and risks of smoking. Nicotine replacement therapy includes nicotine gum, lozenges, inhalers, nasal sprays, and skin patches. Some may be available over-the-counter and others require a prescription. °· Antidepressant medicine helps people abstain from smoking, but how this works is unknown. This medicine is available by prescription. °· Nicotinic receptor partial agonist medicine simulates the effect of   nicotine in your brain. This medicine is available by prescription. °Ask your health care provider for advice about which medicines to use and how to use them based on your health history. Your health care provider will tell you what side effects to look out for if you choose to be on a medicine or therapy. Carefully read the information on the package. Do not use any other product containing nicotine while using a nicotine replacement product.  °RELAPSE OR DIFFICULT SITUATIONS °Most relapses occur within the first 3 months after quitting. Do not be discouraged if you start smoking again. Remember, most people try several times before finally quitting. You may have symptoms of withdrawal because your body is used to nicotine. You may crave cigarettes, be irritable, feel very hungry, cough often, get headaches, or have difficulty concentrating. The withdrawal symptoms are only temporary. They are strongest when you first quit, but they will go away within 10-14 days. °To reduce the chances of relapse, try to: °· Avoid drinking alcohol. Drinking lowers your chances of successfully  quitting. °· Reduce the amount of caffeine you consume. Once you quit smoking, the amount of caffeine in your body increases and can give you symptoms, such as a rapid heartbeat, sweating, and anxiety. °· Avoid smokers because they can make you want to smoke. °· Do not let weight gain distract you. Many smokers will gain weight when they quit, usually less than 10 pounds. Eat a healthy diet and stay active. You can always lose the weight gained after you quit. °· Find ways to improve your mood other than smoking. °FOR MORE INFORMATION  °www.smokefree.gov  °Document Released: 06/30/2001 Document Revised: 11/20/2013 Document Reviewed: 10/15/2011 °[employer]® Patient Information ©2015 [employer]. This information is not intended to replace advice given to you by your health care provider. Make sure you discuss any questions you have with your health care provider. ° °

## 2014-10-30 NOTE — Progress Notes (Signed)
Loop recorder 

## 2014-10-30 NOTE — ED Notes (Signed)
Pt sts SOB x 1 hour; pt sts hx of COPD

## 2014-10-30 NOTE — ED Provider Notes (Signed)
CSN: [id number]     Arrival date & time 10/30/14  1422 History   First MD Initiated Contact with Patient 10/30/14 1750     Chief Complaint  Patient presents with  . Shortness of Breath      HPI Patient presents with wheezing and shortness of breath for the last hour.  Patient used inhaler at home but no relief.  History of COPD.  Denies any fever or chills recently.  Denies chest pain.  Patient is a current smoker Past Medical History  Diagnosis Date  . Cardiomyopathy     EF55% 11/14<<35%   . Substance abuse     alcohol  . Hypertension   . Atrial fibrillation /flutter     hx of flutter ablation 2/12  . Shortness of breath     "all the time lately" (12/14/2012)  . GERD (gastroesophageal reflux disease)   . Hepatitis     "think I had that once; a long time ago; from dirty needles I think" (12/14/2012)  . 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 Leukocytosis, unspecified 06/12/2013  . 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 Obesity   . Syncope   . Cancer   . CHF (congestive heart failure)   . Renal insufficiency    Past Surgical History  Procedure Laterality Date  . Hemorrhoid surgery  1970's  . Cardiac electrophysiology mapping and ablation  08/2010    [name] 09/07/2010 (12/14/2012)  . Loop recorder implant  08-22-2013    MDT LinQ implanted by Dr Caryl Comes for syncope  . Loop recorder implant N/A 08/23/2013    Procedure: LOOP RECORDER IMPLANT;  Surgeon: Deboraha Sprang, MD;  Location: [facility] CATH LAB;  Service: Cardiovascular;  Laterality: N/A;  . Colonoscopy      15-20 years ago had colon in [place]   Family History  Problem Relation Age of Onset  . Diabetes Mother   . Hypertension Mother   . Cirrhosis Father   . Alcohol abuse Father   . Colon cancer Neg Hx   . Rectal cancer Neg Hx   . Stomach cancer Neg Hx    History  Substance Use Topics  . Smoking status: Current Every Day Smoker -- 0.20 packs/day for 40 years    Types: Cigarettes  . Smokeless tobacco: Never Used     Comment:  patient smoked 1-2 cigs x 50 years, now smoking approx 4-5 cigs per day x 1 year  . Alcohol Use: 0.0 oz/week    0 Standard drinks or equivalent per week     Comment: drinks a beer once in a while for the past year, but has h/o heavy alcohol use for many years before this    Review of Systems  All other systems reviewed and are negative  Allergies  Codeine  Home Medications   Prior to Admission medications   Medication Sig Start Date End Date Taking? Authorizing Provider  albuterol (PROAIR HFA) 108 (90 BASE) MCG/ACT inhaler Inhale 1-2 puffs into the lungs daily as needed for wheezing or shortness of breath. 05/28/14  Yes Cresenciano Genre, MD  amiodarone (PACERONE) 200 MG tablet Take 1 tablet (200 mg total) by mouth daily. 10/25/14  Yes Cresenciano Genre, MD  amLODipine (NORVASC) 5 MG tablet take 1 tablet by mouth once daily 07/06/14  Yes Cresenciano Genre, MD  Budesonide (PULMICORT FLEXHALER) 90 MCG/ACT inhaler Inhale 1 puff into the lungs daily.    Yes Historical Provider, MD  carvedilol (COREG) 12.5 MG tablet Take 1 tablet (12.5 mg total)  by mouth 2 (two) times daily with a meal. 09/14/14  Yes Cresenciano Genre, MD  furosemide (LASIX) 40 MG tablet Take 1 tablet (40 mg total) by mouth daily. 08/30/14 08/30/15 Yes Cresenciano Genre, MD  hydroxyurea (HYDREA) 500 MG capsule Take 2 capsules (1,000 mg total) by mouth daily. May take with food to minimize GI side effects. 08/24/14  Yes Heath Lark, MD  methadone (DOLOPHINE) 10 MG/5ML solution Take 46 mg by mouth every morning.    Yes Historical Provider, MD  tiotropium (SPIRIVA HANDIHALER) 18 MCG inhalation capsule Place 1 capsule (18 mcg total) into inhaler and inhale daily. 05/03/14  Yes Kinnie Feil, MD  warfarin (COUMADIN) 5 MG tablet Take 1 tablet (5 mg total) by mouth daily. 09/24/14  Yes [name], MD   BP 119/67 mmHg  Pulse 67  Temp(Src) 99 F (37.2 C) (Oral)  Resp 18  SpO2 100% Physical Exam  Constitutional: He is oriented to person, place,  and time. He appears well-developed and well-nourished. No distress.  HENT:  Head: Normocephalic and atraumatic.  Eyes: Pupils are equal, round, and reactive to light.  Neck: Normal range of motion.  Cardiovascular: Normal rate and intact distal pulses.   Pulmonary/Chest: No respiratory distress. He has wheezes.  Abdominal: Normal appearance. He exhibits no distension.  Musculoskeletal: Normal range of motion.  Neurological: He is alert and oriented to person, place, and time. No cranial nerve deficit.  Skin: Skin is warm and dry. No rash noted.  Psychiatric: He has a normal mood and affect. His behavior is normal.  Nursing note and vitals reviewed.   ED Course  Procedures (including critical care time) Medications  albuterol (PROVENTIL) (2.5 MG/3ML) 0.083% nebulizer solution 10 mg (not administered)  ipratropium (ATROVENT) nebulizer solution 0.5 mg (not administered)    Labs Review Labs Reviewed  BASIC METABOLIC PANEL - Abnormal; Notable for the following:    BUN 26 (*)    Creatinine, Ser 1.44 (*)    GFR calc non Af Amer 49 (*)    GFR calc Af Amer 57 (*)    All other components within normal limits  CBC WITH DIFFERENTIAL/PLATELET - Abnormal; Notable for the following:    RBC 2.98 (*)    Hemoglobin 12.8 (*)    HCT 38.1 (*)    MCV 127.9 (*)    MCH 43.0 (*)    Platelets 138 (*)    Neutrophils Relative % 87 (*)    Lymphocytes Relative 7 (*)    Lymphs Abs 0.3 (*)    All other components within normal limits  Randolm Idol, ED    Imaging Review Dg Chest 2 View  10/30/2014   CLINICAL DATA:  Shortness of Breath.  History of cardiomyopathy  EXAM: CHEST  2 VIEW  COMPARISON:  Chest radiograph June 05, 2014 and chest CT June 05, 2014  FINDINGS: There is no edema or consolidation. Heart size and pulmonary vascularity are within normal limits. No adenopathy. An implanted monitor/event recorder device is present on the left.  IMPRESSION: No edema or consolidation.    Electronically Signed   By: Lowella Grip III M.D.   On: 10/30/2014 15:18    After treatment in the ED the patient feels back to baseline and wants to go home.  MDM   Final diagnoses:  None        Leonard Schwartz, MD 10/30/14 1932

## 2014-10-30 NOTE — ED Notes (Signed)
Respiratory called to do breathing tx. Pt in no acute distress

## 2014-10-31 ENCOUNTER — Observation Stay (HOSPITAL_COMMUNITY)
Admission: EM | Admit: 2014-10-31 | Discharge: 2014-11-02 | Disposition: A | Discharge status: Home / Self Care | Payer: Medicare Other | Attending: Internal Medicine | Admitting: Internal Medicine

## 2014-10-31 ENCOUNTER — Encounter (HOSPITAL_COMMUNITY): Payer: Self-pay | Admitting: Physical Medicine and Rehabilitation

## 2014-10-31 DIAGNOSIS — I429 Cardiomyopathy, unspecified: Secondary | ICD-10-CM | POA: Insufficient documentation

## 2014-10-31 DIAGNOSIS — E669 Obesity, unspecified: Secondary | ICD-10-CM | POA: Insufficient documentation

## 2014-10-31 DIAGNOSIS — Z6832 Body mass index (BMI) 32.0-32.9, adult: Secondary | ICD-10-CM | POA: Insufficient documentation

## 2014-10-31 DIAGNOSIS — R319 Hematuria, unspecified: Secondary | ICD-10-CM | POA: Diagnosis not present

## 2014-10-31 DIAGNOSIS — Z79891 Long term (current) use of opiate analgesic: Secondary | ICD-10-CM | POA: Diagnosis not present

## 2014-10-31 DIAGNOSIS — C946 Myelodysplastic disease, not classified: Secondary | ICD-10-CM | POA: Diagnosis not present

## 2014-10-31 DIAGNOSIS — Z720 Tobacco use: Secondary | ICD-10-CM | POA: Diagnosis present

## 2014-10-31 DIAGNOSIS — K219 Gastro-esophageal reflux disease without esophagitis: Secondary | ICD-10-CM | POA: Diagnosis not present

## 2014-10-31 DIAGNOSIS — D630 Anemia in neoplastic disease: Secondary | ICD-10-CM | POA: Diagnosis not present

## 2014-10-31 DIAGNOSIS — R0602 Shortness of breath: Secondary | ICD-10-CM

## 2014-10-31 DIAGNOSIS — N179 Acute kidney failure, unspecified: Secondary | ICD-10-CM | POA: Diagnosis present

## 2014-10-31 DIAGNOSIS — I10 Essential (primary) hypertension: Secondary | ICD-10-CM | POA: Diagnosis present

## 2014-10-31 DIAGNOSIS — T50905A Adverse effect of unspecified drugs, medicaments and biological substances, initial encounter: Secondary | ICD-10-CM

## 2014-10-31 DIAGNOSIS — Z7901 Long term (current) use of anticoagulants: Secondary | ICD-10-CM | POA: Diagnosis not present

## 2014-10-31 DIAGNOSIS — J441 Chronic obstructive pulmonary disease with (acute) exacerbation: Secondary | ICD-10-CM | POA: Diagnosis present

## 2014-10-31 DIAGNOSIS — N289 Disorder of kidney and ureter, unspecified: Secondary | ICD-10-CM | POA: Insufficient documentation

## 2014-10-31 DIAGNOSIS — I4891 Unspecified atrial fibrillation: Secondary | ICD-10-CM | POA: Diagnosis not present

## 2014-10-31 DIAGNOSIS — F1120 Opioid dependence, uncomplicated: Secondary | ICD-10-CM | POA: Diagnosis present

## 2014-10-31 DIAGNOSIS — D45 Polycythemia vera: Secondary | ICD-10-CM | POA: Diagnosis not present

## 2014-10-31 DIAGNOSIS — F17210 Nicotine dependence, cigarettes, uncomplicated: Secondary | ICD-10-CM | POA: Diagnosis not present

## 2014-10-31 DIAGNOSIS — D471 Chronic myeloproliferative disease: Secondary | ICD-10-CM | POA: Diagnosis present

## 2014-10-31 DIAGNOSIS — I5042 Chronic combined systolic (congestive) and diastolic (congestive) heart failure: Secondary | ICD-10-CM | POA: Diagnosis not present

## 2014-10-31 DIAGNOSIS — D6959 Other secondary thrombocytopenia: Secondary | ICD-10-CM | POA: Diagnosis not present

## 2014-10-31 DIAGNOSIS — R0600 Dyspnea, unspecified: Secondary | ICD-10-CM | POA: Diagnosis present

## 2014-10-31 DIAGNOSIS — I5033 Acute on chronic diastolic (congestive) heart failure: Secondary | ICD-10-CM | POA: Diagnosis present

## 2014-10-31 HISTORY — DX: Chronic obstructive pulmonary disease, unspecified: J44.9

## 2014-10-31 LAB — COMPREHENSIVE METABOLIC PANEL
ALT: 30 U/L (ref 0–53)
AST: 37 U/L (ref 0–37)
Albumin: 3.3 g/dL — ABNORMAL LOW (ref 3.5–5.2)
Alkaline Phosphatase: 70 U/L (ref 39–117)
Anion gap: 11 (ref 5–15)
BILIRUBIN TOTAL: 0.7 mg/dL (ref 0.3–1.2)
BUN: 29 mg/dL — ABNORMAL HIGH (ref 6–23)
CHLORIDE: 96 mmol/L (ref 96–112)
CO2: 31 mmol/L (ref 19–32)
Calcium: 8.6 mg/dL (ref 8.4–10.5)
Creatinine, Ser: 1.39 mg/dL — ABNORMAL HIGH (ref 0.50–1.35)
GFR calc Af Amer: 59 mL/min — ABNORMAL LOW (ref >90)
GFR, EST NON AFRICAN AMERICAN: 51 mL/min — AB (ref >90)
GLUCOSE: 105 mg/dL — AB (ref 70–99)
Potassium: 3.7 mmol/L (ref 3.5–5.1)
SODIUM: 138 mmol/L (ref 135–145)
Total Protein: 7.4 g/dL (ref 6.0–8.3)

## 2014-10-31 LAB — CBC WITH DIFFERENTIAL/PLATELET
BASOS PCT: 0 % (ref 0–1)
Basophils Absolute: 0 10*3/uL (ref 0.0–0.1)
Eosinophils Absolute: 0 10*3/uL (ref 0.0–0.7)
Eosinophils Relative: 0 % (ref 0–5)
HEMATOCRIT: 36.4 % — AB (ref 39.0–52.0)
HEMOGLOBIN: 12.3 g/dL — AB (ref 13.0–17.0)
Lymphocytes Relative: 7 % — ABNORMAL LOW (ref 12–46)
Lymphs Abs: 0.3 10*3/uL — ABNORMAL LOW (ref 0.7–4.0)
MCH: 42.4 pg — ABNORMAL HIGH (ref 26.0–34.0)
MCHC: 33.8 g/dL (ref 30.0–36.0)
MCV: 125.5 fL — AB (ref 78.0–100.0)
MONOS PCT: 9 % (ref 3–12)
Monocytes Absolute: 0.3 10*3/uL (ref 0.1–1.0)
NEUTROS ABS: 3.2 10*3/uL (ref 1.7–7.7)
Neutrophils Relative %: 84 % — ABNORMAL HIGH (ref 43–77)
Platelets: 126 10*3/uL — ABNORMAL LOW (ref 150–400)
RBC: 2.9 MIL/uL — AB (ref 4.22–5.81)
RDW: 13.3 % (ref 11.5–15.5)
WBC: 3.8 10*3/uL — AB (ref 4.0–10.5)

## 2014-10-31 LAB — URINALYSIS, ROUTINE W REFLEX MICROSCOPIC
Bilirubin Urine: NEGATIVE
Glucose, UA: NEGATIVE mg/dL
Ketones, ur: NEGATIVE mg/dL
Leukocytes, UA: NEGATIVE
NITRITE: NEGATIVE
PH: 5 (ref 5.0–8.0)
Protein, ur: 30 mg/dL — AB
SPECIFIC GRAVITY, URINE: 1.024 (ref 1.005–1.030)
Urobilinogen, UA: 0.2 mg/dL (ref 0.0–1.0)

## 2014-10-31 LAB — URINE MICROSCOPIC-ADD ON

## 2014-10-31 LAB — LIPASE, BLOOD: LIPASE: 15 U/L (ref 11–59)

## 2014-10-31 MED ORDER — IPRATROPIUM BROMIDE 0.02 % IN SOLN
0.5000 mg | Freq: Once | RESPIRATORY_TRACT | Status: AC
  Administered 2014-10-31: 0.5 mg via RESPIRATORY_TRACT
  Filled 2014-10-31: qty 2.5

## 2014-10-31 MED ORDER — METHYLPREDNISOLONE SODIUM SUCC 125 MG IJ SOLR
125.0000 mg | Freq: Once | INTRAMUSCULAR | Status: AC
  Administered 2014-10-31: 125 mg via INTRAVENOUS
  Filled 2014-10-31: qty 2

## 2014-10-31 MED ORDER — ALBUTEROL (5 MG/ML) CONTINUOUS INHALATION SOLN
15.0000 mg/h | INHALATION_SOLUTION | RESPIRATORY_TRACT | Status: DC
  Administered 2014-10-31: 15 mg/h via RESPIRATORY_TRACT
  Filled 2014-10-31: qty 20

## 2014-10-31 NOTE — ED Notes (Signed)
RT called for hour long neb.

## 2014-10-31 NOTE — ED Notes (Signed)
Pt has tremble in hand.  When SpO2 monitor picking up well-formed pleth, SpO2 readings at 91-94%.  Pt with a hx of COPD.  When tremble is picked up, pt reading incorrect (and low) SpO2.

## 2014-10-31 NOTE — H&P (Signed)
Date: 11/01/2014               Patient Name:  [name] MRN: [id number]  DOB: [birth year] Age / Sex: 69 y.o., male   PCP: Cresenciano Genre, MD         Medical Service: Internal Medicine Teaching Service         Attending Physician: Dr. Bertha Stakes    First Contact: Dr. Dellia Nims Pager: [phone]  Second Contact: Dr. Juluis Mire Pager: [phone]       After Hours (After 5p/  First Contact Pager: [phone]  weekends / holidays): Second Contact Pager: [phone]   Chief Complaint: Shortness of breath and cough for 2-3 days  History of Present Illness: [name] is a 69 yo man with a history of COPD Gold Stage III, systolic CHF, atrial fibrillation (on amiodarone and warfarin), hypertension and polycythemia vera who was admitted for COPD exacerbation. He noticed some shortness of breath and that he was coughing more than usual over the past 2-3 days. His cough was dry and not accompanied by subjective fever, chills, sore throat, body aches or congestion. His grandchildren had a cold last week. He is currently smoking 1-2 cigarettes per day and does not use home oxygen. He received his flu shot this year.  He last had PFTs on 05/31/15: FEV1/FVC 0.58, FEV1 45% of predicted. Over the past several years, he has had 1-2 COPD exacerbations per year. He manages his COPD at home with albuterol, budesonide and Spiriva inhalers.   Meds: Current Outpatient Prescriptions  Medication Sig Dispense Refill  . albuterol (PROAIR HFA) 108 (90 BASE) MCG/ACT inhaler Inhale 1-2 puffs into the lungs daily as needed for wheezing or shortness of breath. 18 g 5  . amiodarone (PACERONE) 200 MG tablet Take 1 tablet (200 mg total) by mouth daily. 30 tablet 0  . amLODipine (NORVASC) 5 MG tablet take 1 tablet by mouth once daily 30 tablet 2  . Budesonide (PULMICORT FLEXHALER) 90 MCG/ACT inhaler Inhale 1 puff into the lungs daily.     . carvedilol (COREG) 12.5 MG tablet Take 1 tablet  (12.5 mg total) by mouth 2 (two) times daily with a meal. 60 tablet 2  . furosemide (LASIX) 40 MG tablet Take 1 tablet (40 mg total) by mouth daily. 30 tablet 1  . hydroxyurea (HYDREA) 500 MG capsule Take 2 capsules (1,000 mg total) by mouth daily. May take with food to minimize GI side effects. 90 capsule 3  . methadone (DOLOPHINE) 10 MG/5ML solution Take 46 mg by mouth every morning.     . warfarin (COUMADIN) 5 MG tablet Take 1 tablet (5 mg total) by mouth daily. 30 tablet 1  . tiotropium (SPIRIVA HANDIHALER) 18 MCG inhalation capsule Place 1 capsule (18 mcg total) into inhaler and inhale daily. 30 capsule 12    Allergies: Allergies as of 10/31/2014 - Review Complete 10/31/2014  Allergen Reaction Noted  . Codeine Rash 09/30/2010   Past Medical History  Diagnosis Date  . Cardiomyopathy     EF55% 11/14<<35%   . Substance abuse     alcohol  . Hypertension   . Atrial fibrillation /flutter     hx of flutter ablation 2/12  . Shortness of breath     "all the time lately" (12/14/2012)  . GERD (gastroesophageal reflux disease)   . Hepatitis     "think I had that once; a long time ago; from dirty needles I think" (12/14/2012)  . Polycythemia,  secondary 06/12/2013  . Leukocytosis, unspecified 06/12/2013  . 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 Obesity   . Syncope   . Cancer   . CHF (congestive heart failure)   . Renal insufficiency   . COPD (chronic obstructive pulmonary disease)    Past Surgical History  Procedure Laterality Date  . Hemorrhoid surgery  1970's  . Cardiac electrophysiology mapping and ablation  08/2010    [name] 09/07/2010 (12/14/2012)  . Loop recorder implant  08-22-2013    MDT LinQ implanted by Dr Caryl Comes for syncope  . Loop recorder implant N/A 08/23/2013    Procedure: LOOP RECORDER IMPLANT;  Surgeon: Deboraha Sprang, MD;  Location: [facility] CATH LAB;  Service: Cardiovascular;  Laterality: N/A;  . Colonoscopy      15-20 years ago had colon in [place]   Family History  Problem  Relation Age of Onset  . Diabetes Mother   . Hypertension Mother   . Cirrhosis Father   . Alcohol abuse Father   . Colon cancer Neg Hx   . Rectal cancer Neg Hx   . Stomach cancer Neg Hx    History   Social History  . Marital Status: Single    Spouse Name: N/A  . Number of Children: N/A  . Years of Education: N/A   Occupational History  . Retired    Social History Main Topics  . Smoking status: Current Every Day Smoker -- 0.20 packs/day for 40 years    Types: Cigarettes  . Smokeless tobacco: Never Used     Comment: patient smoked 1-2 cigs x 50 years, now smoking approx 4-5 cigs per day x 1 year  . Alcohol Use: 0.0 oz/week    0 Standard drinks or equivalent per week  . Drug Use: Yes    Special: "Crack" cocaine, Cocaine, Heroin     Comment: 03/20/2013 "been clean for awhile"; endorses using heroin "all my life" up until about a year ago  . Sexual Activity: Not Currently   Other Topics Concern  . Not on file   Social History Narrative   Moved here from [place]. Lives with common law wife and grandchildren. He is retired from "general labor."    Review of Systems: See HPI  Physical Exam: Blood pressure 108/72, pulse 78, temperature 99.2 F (37.3 C), temperature source Oral, resp. rate 12, height 5\' 6"  (1.676 m), weight 199 lb 3.2 oz (90.357 kg), SpO2 93 %. Appearance: in NAD, sitting up at side of bed, occasionally has a coughing spell that sounds wet but is not productive of sputum, 2L O2 [place] in place HEENT: AT/[place], PERRL, EOMi, MMM, no lymphadenopathy Heart: RRR, normal S1S2, no murmurs Lungs: Expiratory wheezing R>L, increased work of breathing Abdomen: protuberant abdomen, bowel sounds present, soft, nontender Musculoskeletal: normal range of motion Extremities: 1-2+ pitting edema in BLE Neurologic: A&Ox3, grossly intact Skin: no rashes or lesions   Lab results: Basic Metabolic Panel:  Recent Labs  10/30/14 1437 10/31/14 1822  NA 140 138  K 4.2 3.7  CL 99  96  CO2 30 31  GLUCOSE 98 105*  BUN 26* 29*  CREATININE 1.44* 1.39*  CALCIUM 8.8 8.6   Liver Function Tests:  Recent Labs  10/31/14 1822  AST 37  ALT 30  ALKPHOS 70  BILITOT 0.7  PROT 7.4  ALBUMIN 3.3*    Recent Labs  10/31/14 1822  LIPASE 15   CBC:  Recent Labs  10/30/14 1437 10/31/14 1822  WBC 4.6 3.8*  NEUTROABS 4.1 3.2  HGB 12.8* 12.3*  HCT 38.1* 36.4*  MCV 127.9* 125.5*  PLT 138* 126*   Coagulation:  Recent Labs  10/29/14 1051  INR 3.00   Urine Drug Screen: Drugs of Abuse     Component Value Date/Time   LABOPIA NONE DETECTED 10/31/2014 2021   LABOPIA NEGATIVE 06/05/2014 0101   COCAINSCRNUR NONE DETECTED 10/31/2014 2021   COCAINSCRNUR NEGATIVE 06/05/2014 0101   LABBENZ NONE DETECTED 10/31/2014 2021   LABBENZ NEGATIVE 06/05/2014 0101   AMPHETMU NONE DETECTED 10/31/2014 2021   AMPHETMU NEGATIVE 06/05/2014 0101   THCU NONE DETECTED 10/31/2014 2021   LABBARB NONE DETECTED 10/31/2014 2021    Urinalysis:  Recent Labs  10/31/14 2021  COLORURINE YELLOW  LABSPEC 1.024  PHURINE 5.0  GLUCOSEU NEGATIVE  HGBUR LARGE*  BILIRUBINUR NEGATIVE  KETONESUR NEGATIVE  PROTEINUR 30*  UROBILINOGEN 0.2  NITRITE NEGATIVE  LEUKOCYTESUR NEGATIVE    Imaging results:  Dg Chest 2 View  10/30/2014   CLINICAL DATA:  Shortness of Breath.  History of cardiomyopathy  EXAM: CHEST  2 VIEW  COMPARISON:  Chest radiograph June 05, 2014 and chest CT June 05, 2014  FINDINGS: There is no edema or consolidation. Heart size and pulmonary vascularity are within normal limits. No adenopathy. An implanted monitor/event recorder device is present on the left.  IMPRESSION: No edema or consolidation.   Electronically Signed   By: Lowella Grip III M.D.   On: 10/30/2014 15:18    Other results: EKG: Rate 67, normal sinus rhythm, TWI unchanged from priors  Assessment & Plan by Problem: Principal Problem:   Dyspnea Active Problems:   Atrial fibrillation    Hypertension   Tobacco abuse   Polycythemia vera   Chronic systolic heart failure   Myeloproliferative neoplasm   History of heroin abuse   Chronic anticoagulation   Thrombocytopenia due to drugs   COPD exacerbation   AKI (acute kidney injury)  [name] is a 69 yo man with COPD, systolic CHF, atrial fibrillation, hypertension and polycythemia vera who was admitted for COPD exacerbation.   COPD Exacerbation:  - CXR - Doxycycline 100 mg q12 hours PO for broad coverage without coumadin interaction - Solumedrol 125 mg were given today; continue tomorrow with prednisone - PRN albuterol - Flovent BID - Duonebs q4 hours - Tessalon 200 mg TID PRN for cough - Sputum culture - Gram stain   Chronic Systolic Heart Failure: Last EF 60-65% with grade I diastolic dysfunction 40/9811. On lasix at home. - Holding home lasix 40 mg daily in context of AKI - I&Os - Telemetry  AKI: Baseline creatinine ~1.0. Currently 1.44.  - Holding home lasix - Gentle IVF NS 50 mL/hr - Continue to trend - Fractional excretion urea pending  Atrial Fibrillation: Patient is on coumadin lifelong with goal 2-3 and amiodarone. - Continue amiodarone 200 mg daily - Resume coumadin per pharmacy - Trend protime-INR  Hypertension: Currently 108/72. On carvedilol at home. - Continue home carvedilol 12.5 mg BID with meals  Chronic Hematuria: Present on UA today, and has been chronic for him. - Consider repeating UA - May need outpatient follow up  Polycythemia Vera: Patient has JAK2 mutation positive myeloproliferative disorder. He takes hydroxyurea. 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 Patient's polycythemia treatments contribute to his anemia.   History of Substance Abuse: Prior alcohol/heroin abuse. Now reportedly on methadone 43 mg [facility] but UDS negative for opiates. Patient said his last methadone dose (46 mg daily) was day of admission. - Methadone d/c for now - Call tomorrow to confirm methadone dose  Diet: HH  DVT Ppx: On coumadin with SCDs  Dispo: Disposition is deferred at this time, awaiting improvement of current medical problems. Anticipated discharge in approximately 2 day(s).   The patient does have a current PCP Cresenciano Genre, MD) and does need an [facility] hospital follow-up appointment after discharge.  The patient does not know have transportation limitations that hinder transportation to clinic appointments.  Signed: Karlene Einstein, MD 11/01/2014, 2:37 AM

## 2014-10-31 NOTE — ED Notes (Signed)
Pt presents to department for dry cough, diffuse abdominal pain, and headache. Ongoing x1 day. 8/10 abdominal pain at present. No vomiting. Pt is alert and oriented x4.

## 2014-10-31 NOTE — ED Provider Notes (Signed)
CSN: [id number]     Arrival date & time 10/31/14  1758 History   First MD Initiated Contact with Patient 10/31/14 1953     Chief Complaint  Patient presents with  . Cough  . Abdominal Pain  . Headache     (Consider location/radiation/quality/duration/timing/severity/associated sxs/prior Treatment) HPI Patient presents to the emergency department with worsening shortness of breath, cough, over the last several days.  Patient was seen in the emergency room yesterday for similar symptoms and discharged home.  He stated he felt better after the nebulized treatment.  Patient states his condition worsened and that is what brought him back to the emergency department.  The patient denies chest pain, weakness, dizziness, headache, blurred vision, back pain, neck pain, fever, running nose, sore throat, rash, lightheadedness or syncope.  The patient states that his inhalers did not seem to be helping him at home Past Medical History  Diagnosis Date  . Cardiomyopathy     EF55% 11/14<<35%   . Substance abuse     alcohol  . Hypertension   . Atrial fibrillation /flutter     hx of flutter ablation 2/12  . Shortness of breath     "all the time lately" (12/14/2012)  . GERD (gastroesophageal reflux disease)   . Hepatitis     "think I had that once; a long time ago; from dirty needles I think" (12/14/2012)  . 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 Leukocytosis, unspecified 06/12/2013  . 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 Obesity   . Syncope   . Cancer   . CHF (congestive heart failure)   . Renal insufficiency    Past Surgical History  Procedure Laterality Date  . Hemorrhoid surgery  1970's  . Cardiac electrophysiology mapping and ablation  08/2010    [name] 09/07/2010 (12/14/2012)  . Loop recorder implant  08-22-2013    MDT LinQ implanted by Dr Caryl Comes for syncope  . Loop recorder implant N/A 08/23/2013    Procedure: LOOP RECORDER IMPLANT;  Surgeon: Deboraha Sprang, MD;  Location: [facility] CATH LAB;   Service: Cardiovascular;  Laterality: N/A;  . Colonoscopy      15-20 years ago had colon in [place]   Family History  Problem Relation Age of Onset  . Diabetes Mother   . Hypertension Mother   . Cirrhosis Father   . Alcohol abuse Father   . Colon cancer Neg Hx   . Rectal cancer Neg Hx   . Stomach cancer Neg Hx    History  Substance Use Topics  . Smoking status: Current Every Day Smoker -- 0.20 packs/day for 40 years    Types: Cigarettes  . Smokeless tobacco: Never Used     Comment: patient smoked 1-2 cigs x 50 years, now smoking approx 4-5 cigs per day x 1 year  . Alcohol Use: 0.0 oz/week    0 Standard drinks or equivalent per week    Review of Systems  All other systems negative except as documented in the HPI. All pertinent positives and negatives as reviewed in the HPI.  Allergies  Codeine  Home Medications   Prior to Admission medications   Medication Sig Start Date End Date Taking? Authorizing Provider  albuterol (PROAIR HFA) 108 (90 BASE) MCG/ACT inhaler Inhale 1-2 puffs into the lungs daily as needed for wheezing or shortness of breath. 05/28/14  Yes Cresenciano Genre, MD  amiodarone (PACERONE) 200 MG tablet Take 1 tablet (200 mg total) by mouth daily. 10/25/14  Yes Cresenciano Genre, MD  amLODipine (  NORVASC) 5 MG tablet take 1 tablet by mouth once daily 07/06/14  Yes Cresenciano Genre, MD  Budesonide (PULMICORT FLEXHALER) 90 MCG/ACT inhaler Inhale 1 puff into the lungs daily.    Yes Historical Provider, MD  carvedilol (COREG) 12.5 MG tablet Take 1 tablet (12.5 mg total) by mouth 2 (two) times daily with a meal. 09/14/14  Yes Cresenciano Genre, MD  furosemide (LASIX) 40 MG tablet Take 1 tablet (40 mg total) by mouth daily. 08/30/14 08/30/15 Yes Cresenciano Genre, MD  hydroxyurea (HYDREA) 500 MG capsule Take 2 capsules (1,000 mg total) by mouth daily. May take with food to minimize GI side effects. 08/24/14  Yes Heath Lark, MD  methadone (DOLOPHINE) 10 MG/5ML solution Take 46 mg by mouth every  morning.    Yes Historical Provider, MD  warfarin (COUMADIN) 5 MG tablet Take 1 tablet (5 mg total) by mouth daily. 09/24/14  Yes [name], MD  tiotropium (SPIRIVA HANDIHALER) 18 MCG inhalation capsule Place 1 capsule (18 mcg total) into inhaler and inhale daily. 05/03/14   Kinnie Feil, MD   BP 130/73 mmHg  Pulse 73  Temp(Src) 98.1 F (36.7 C)  Resp 12  SpO2 92% Physical Exam  Constitutional: He is oriented to person, place, and time. He appears well-developed and well-nourished. No distress.  HENT:  Head: Normocephalic and atraumatic.  Mouth/Throat: Oropharynx is clear and moist. No oropharyngeal exudate.  Eyes: Pupils are equal, round, and reactive to light.  Neck: Normal range of motion. Neck supple.  Cardiovascular: Normal rate, regular rhythm and normal heart sounds.  Exam reveals no gallop and no friction rub.   No murmur heard. Pulmonary/Chest: Tachypnea noted. He is in respiratory distress. He has wheezes. He has rhonchi.  Neurological: He is alert and oriented to person, place, and time. He exhibits normal muscle tone. Coordination normal.  Skin: Skin is warm and dry. No rash noted. No erythema.  Psychiatric: He has a normal mood and affect. His behavior is normal.  Nursing note and vitals reviewed.   ED Course  Procedures (including critical care time) Labs Review Labs Reviewed  CBC WITH DIFFERENTIAL/PLATELET - Abnormal; Notable for the following:    WBC 3.8 (*)    RBC 2.90 (*)    Hemoglobin 12.3 (*)    HCT 36.4 (*)    MCV 125.5 (*)    MCH 42.4 (*)    Platelets 126 (*)    Neutrophils Relative % 84 (*)    Lymphocytes Relative 7 (*)    Lymphs Abs 0.3 (*)    All other components within normal limits  COMPREHENSIVE METABOLIC PANEL - Abnormal; Notable for the following:    Glucose, Bld 105 (*)    BUN 29 (*)    Creatinine, Ser 1.39 (*)    Albumin 3.3 (*)    GFR calc non Af Amer 51 (*)    GFR calc Af Amer 59 (*)    All other components within normal  limits  URINALYSIS, ROUTINE W REFLEX MICROSCOPIC - Abnormal; Notable for the following:    APPearance CLOUDY (*)    Hgb urine dipstick LARGE (*)    Protein, ur 30 (*)    All other components within normal limits  URINE MICROSCOPIC-ADD ON - Abnormal; Notable for the following:    Bacteria, UA MANY (*)    Casts GRANULAR CAST (*)    All other components within normal limits  LIPASE, BLOOD  I-STAT ARTERIAL BLOOD GAS, ED    Imaging Review  Dg Chest 2 View  10/30/2014   CLINICAL DATA:  Shortness of Breath.  History of cardiomyopathy  EXAM: CHEST  2 VIEW  COMPARISON:  Chest radiograph June 05, 2014 and chest CT June 05, 2014  FINDINGS: There is no edema or consolidation. Heart size and pulmonary vascularity are within normal limits. No adenopathy. An implanted monitor/event recorder device is present on the left.  IMPRESSION: No edema or consolidation.   Electronically Signed   By: Lowella Grip III M.D.   On: 10/30/2014 15:18    Patient will be admitted to the hospital for hypoxia due to COPD exacerbation.  Spoke with the resident on call and they will be down to see the patient   Dalia Heading, PA-C 11/02/14 [place], MD 11/08/14 1524

## 2014-10-31 NOTE — ED Notes (Signed)
Pt O2 Saturations 100% on breathing treatment.  Removed from treatment and O2 saturations fluctuating between 80% and 90%.  PA made aware.  Pt placed on 2L O2, 93% at this time with flood pleth.

## 2014-11-01 ENCOUNTER — Other Ambulatory Visit: Payer: Self-pay

## 2014-11-01 ENCOUNTER — Observation Stay (HOSPITAL_COMMUNITY): Payer: Medicare Other

## 2014-11-01 ENCOUNTER — Encounter (HOSPITAL_COMMUNITY): Payer: Self-pay | Admitting: Internal Medicine

## 2014-11-01 DIAGNOSIS — N179 Acute kidney failure, unspecified: Secondary | ICD-10-CM | POA: Diagnosis present

## 2014-11-01 DIAGNOSIS — I4891 Unspecified atrial fibrillation: Secondary | ICD-10-CM | POA: Diagnosis not present

## 2014-11-01 DIAGNOSIS — I10 Essential (primary) hypertension: Secondary | ICD-10-CM | POA: Diagnosis not present

## 2014-11-01 DIAGNOSIS — I5042 Chronic combined systolic (congestive) and diastolic (congestive) heart failure: Secondary | ICD-10-CM | POA: Diagnosis not present

## 2014-11-01 DIAGNOSIS — J441 Chronic obstructive pulmonary disease with (acute) exacerbation: Secondary | ICD-10-CM | POA: Diagnosis not present

## 2014-11-01 LAB — I-STAT ARTERIAL BLOOD GAS, ED
Acid-Base Excess: 4 mmol/L — ABNORMAL HIGH (ref 0.0–2.0)
Bicarbonate: 30.4 mEq/L — ABNORMAL HIGH (ref 20.0–24.0)
O2 Saturation: 94 %
Patient temperature: 98.6
TCO2: 32 mmol/L (ref 0–100)
pCO2 arterial: 49.8 mmHg — ABNORMAL HIGH (ref 35.0–45.0)
pH, Arterial: 7.393 (ref 7.350–7.450)
pO2, Arterial: 71 mmHg — ABNORMAL LOW (ref 80.0–100.0)

## 2014-11-01 LAB — BASIC METABOLIC PANEL
ANION GAP: 9 (ref 5–15)
Anion gap: 12 (ref 5–15)
BUN: 33 mg/dL — ABNORMAL HIGH (ref 6–23)
BUN: 36 mg/dL — ABNORMAL HIGH (ref 6–23)
CHLORIDE: 95 mmol/L — AB (ref 96–112)
CO2: 30 mmol/L (ref 19–32)
CO2: 29 mmol/L (ref 19–32)
CREATININE: 1.68 mg/dL — AB (ref 0.50–1.35)
CREATININE: 1.57 mg/dL — AB (ref 0.50–1.35)
Calcium: 8.7 mg/dL (ref 8.4–10.5)
Calcium: 8 mg/dL — ABNORMAL LOW (ref 8.4–10.5)
Chloride: 98 mmol/L (ref 96–112)
GFR calc Af Amer: 51 mL/min — ABNORMAL LOW (ref >90)
GFR calc non Af Amer: 40 mL/min — ABNORMAL LOW (ref >90)
GFR calc non Af Amer: 44 mL/min — ABNORMAL LOW (ref >90)
GFR, EST AFRICAN AMERICAN: 47 mL/min — AB (ref >90)
Glucose, Bld: 204 mg/dL — ABNORMAL HIGH (ref 70–99)
Glucose, Bld: 125 mg/dL — ABNORMAL HIGH (ref 70–99)
Potassium: 3.9 mmol/L (ref 3.5–5.1)
Potassium: 4.2 mmol/L (ref 3.5–5.1)
Sodium: 137 mmol/L (ref 135–145)
Sodium: 136 mmol/L (ref 135–145)

## 2014-11-01 LAB — CBC WITH DIFFERENTIAL/PLATELET
BASOS ABS: 0 10*3/uL (ref 0.0–0.1)
Basophils Relative: 0 % (ref 0–1)
Eosinophils Absolute: 0 10*3/uL (ref 0.0–0.7)
Eosinophils Relative: 0 % (ref 0–5)
HCT: 35.6 % — ABNORMAL LOW (ref 39.0–52.0)
Hemoglobin: 11.8 g/dL — ABNORMAL LOW (ref 13.0–17.0)
LYMPHS ABS: 0.2 10*3/uL — AB (ref 0.7–4.0)
Lymphocytes Relative: 7 % — ABNORMAL LOW (ref 12–46)
MCH: 42 pg — ABNORMAL HIGH (ref 26.0–34.0)
MCHC: 33.1 g/dL (ref 30.0–36.0)
MCV: 126.7 fL — ABNORMAL HIGH (ref 78.0–100.0)
Monocytes Absolute: 0.1 10*3/uL (ref 0.1–1.0)
Monocytes Relative: 2 % — ABNORMAL LOW (ref 3–12)
NEUTROS PCT: 91 % — AB (ref 43–77)
Neutro Abs: 2.3 10*3/uL (ref 1.7–7.7)
PLATELETS: 105 10*3/uL — AB (ref 150–400)
RBC: 2.81 MIL/uL — AB (ref 4.22–5.81)
RDW: 13.3 % (ref 11.5–15.5)
WBC: 2.6 10*3/uL — AB (ref 4.0–10.5)

## 2014-11-01 LAB — RAPID URINE DRUG SCREEN, HOSP PERFORMED
Amphetamines: NOT DETECTED
BENZODIAZEPINES: NOT DETECTED
Barbiturates: NOT DETECTED
COCAINE: NOT DETECTED
OPIATES: NOT DETECTED
TETRAHYDROCANNABINOL: NOT DETECTED

## 2014-11-01 LAB — MRSA PCR SCREENING: MRSA by PCR: NEGATIVE

## 2014-11-01 LAB — URINE MICROSCOPIC-ADD ON

## 2014-11-01 LAB — URINALYSIS, ROUTINE W REFLEX MICROSCOPIC
BILIRUBIN URINE: NEGATIVE
Glucose, UA: NEGATIVE mg/dL
Ketones, ur: 15 mg/dL — AB
Leukocytes, UA: NEGATIVE
NITRITE: NEGATIVE
PH: 5 (ref 5.0–8.0)
Protein, ur: NEGATIVE mg/dL
Specific Gravity, Urine: 1.022 (ref 1.005–1.030)
Urobilinogen, UA: 0.2 mg/dL (ref 0.0–1.0)

## 2014-11-01 LAB — INFLUENZA PANEL BY PCR (TYPE A & B)
H1N1 flu by pcr: NOT DETECTED
INFLBPCR: NEGATIVE
Influenza A By PCR: NEGATIVE

## 2014-11-01 LAB — BRAIN NATRIURETIC PEPTIDE: B Natriuretic Peptide: 170.1 pg/mL — ABNORMAL HIGH (ref 0.0–100.0)

## 2014-11-01 LAB — PROTIME-INR
INR: 2.08 — ABNORMAL HIGH (ref 0.00–1.49)
Prothrombin Time: 23.6 seconds — ABNORMAL HIGH (ref 11.6–15.2)

## 2014-11-01 LAB — MAGNESIUM: Magnesium: 2 mg/dL (ref 1.5–2.5)

## 2014-11-01 LAB — CREATININE, URINE, RANDOM: CREATININE, URINE: 154.59 mg/dL

## 2014-11-01 MED ORDER — BENZONATATE 100 MG PO CAPS
100.0000 mg | ORAL_CAPSULE | Freq: Two times a day (BID) | ORAL | Status: DC | PRN
  Filled 2014-11-01: qty 1

## 2014-11-01 MED ORDER — DOXYCYCLINE HYCLATE 100 MG PO TABS
100.0000 mg | ORAL_TABLET | Freq: Two times a day (BID) | ORAL | Status: DC
  Administered 2014-11-01 – 2014-11-02 (×4): 100 mg via ORAL
  Filled 2014-11-01 (×5): qty 1

## 2014-11-01 MED ORDER — WARFARIN SODIUM 2.5 MG PO TABS
2.5000 mg | ORAL_TABLET | ORAL | Status: DC
  Administered 2014-11-01: 2.5 mg via ORAL
  Filled 2014-11-01 (×2): qty 1

## 2014-11-01 MED ORDER — AMLODIPINE BESYLATE 5 MG PO TABS
5.0000 mg | ORAL_TABLET | Freq: Every day | ORAL | Status: DC
  Administered 2014-11-01: 5 mg via ORAL
  Filled 2014-11-01: qty 1

## 2014-11-01 MED ORDER — CARVEDILOL 12.5 MG PO TABS
12.5000 mg | ORAL_TABLET | Freq: Two times a day (BID) | ORAL | Status: DC
  Administered 2014-11-01: 12.5 mg via ORAL
  Filled 2014-11-01 (×4): qty 1

## 2014-11-01 MED ORDER — ALBUTEROL SULFATE (2.5 MG/3ML) 0.083% IN NEBU: 3.0000 mL | INHALATION_SOLUTION | Freq: Four times a day (QID) | RESPIRATORY_TRACT | Status: DC | PRN

## 2014-11-01 MED ORDER — IPRATROPIUM-ALBUTEROL 0.5-2.5 (3) MG/3ML IN SOLN: 3.0000 mL | RESPIRATORY_TRACT | Status: DC

## 2014-11-01 MED ORDER — TIOTROPIUM BROMIDE MONOHYDRATE 18 MCG IN CAPS
18.0000 ug | ORAL_CAPSULE | Freq: Every day | RESPIRATORY_TRACT | Status: DC
  Administered 2014-11-01 – 2014-11-02 (×2): 18 ug via RESPIRATORY_TRACT
  Filled 2014-11-01: qty 5

## 2014-11-01 MED ORDER — METHADONE HCL 5 MG/5ML PO SOLN
46.0000 mg | Freq: Every morning | ORAL | Status: DC
  Filled 2014-11-01: qty 500

## 2014-11-01 MED ORDER — WARFARIN - PHARMACIST DOSING INPATIENT: Freq: Every day | Status: DC

## 2014-11-01 MED ORDER — FLUTICASONE PROPIONATE HFA 44 MCG/ACT IN AERO
2.0000 | INHALATION_SPRAY | Freq: Two times a day (BID) | RESPIRATORY_TRACT | Status: DC
  Administered 2014-11-01: 2 via RESPIRATORY_TRACT
  Filled 2014-11-01: qty 10.6

## 2014-11-01 MED ORDER — BENZONATATE 100 MG PO CAPS
200.0000 mg | ORAL_CAPSULE | Freq: Three times a day (TID) | ORAL | Status: DC | PRN
  Administered 2014-11-01 (×2): 200 mg via ORAL
  Filled 2014-11-01 (×4): qty 2

## 2014-11-01 MED ORDER — FUROSEMIDE 40 MG PO TABS
40.0000 mg | ORAL_TABLET | Freq: Every day | ORAL | Status: DC
  Filled 2014-11-01: qty 1

## 2014-11-01 MED ORDER — WARFARIN SODIUM 5 MG PO TABS: 5.0000 mg | ORAL_TABLET | ORAL | Status: DC

## 2014-11-01 MED ORDER — PREDNISONE 50 MG PO TABS
60.0000 mg | ORAL_TABLET | Freq: Every day | ORAL | Status: DC
  Administered 2014-11-01 – 2014-11-02 (×2): 60 mg via ORAL
  Filled 2014-11-01 (×3): qty 1

## 2014-11-01 MED ORDER — IPRATROPIUM-ALBUTEROL 0.5-2.5 (3) MG/3ML IN SOLN: 3.0000 mL | RESPIRATORY_TRACT | Status: DC | PRN

## 2014-11-01 MED ORDER — IPRATROPIUM-ALBUTEROL 0.5-2.5 (3) MG/3ML IN SOLN
3.0000 mL | Freq: Four times a day (QID) | RESPIRATORY_TRACT | Status: DC
  Administered 2014-11-01 (×2): 3 mL via RESPIRATORY_TRACT
  Filled 2014-11-01 (×2): qty 3

## 2014-11-01 MED ORDER — HYDROXYUREA 500 MG PO CAPS
1000.0000 mg | ORAL_CAPSULE | Freq: Every day | ORAL | Status: DC
  Administered 2014-11-01 – 2014-11-02 (×2): 1000 mg via ORAL
  Filled 2014-11-01 (×2): qty 2

## 2014-11-01 MED ORDER — CARVEDILOL 12.5 MG PO TABS
12.5000 mg | ORAL_TABLET | Freq: Two times a day (BID) | ORAL | Status: DC
  Filled 2014-11-01: qty 1

## 2014-11-01 MED ORDER — SODIUM CHLORIDE 0.9 % IJ SOLN
3.0000 mL | Freq: Two times a day (BID) | INTRAMUSCULAR | Status: DC
  Administered 2014-11-01 – 2014-11-02 (×2): 3 mL via INTRAVENOUS

## 2014-11-01 MED ORDER — AMIODARONE HCL 200 MG PO TABS
200.0000 mg | ORAL_TABLET | Freq: Every day | ORAL | Status: DC
  Administered 2014-11-01 – 2014-11-02 (×2): 200 mg via ORAL
  Filled 2014-11-01 (×2): qty 1

## 2014-11-01 MED ORDER — SODIUM CHLORIDE 0.9 % IV SOLN
INTRAVENOUS | Status: AC
  Administered 2014-11-01: 06:00:00 via INTRAVENOUS

## 2014-11-01 MED ORDER — METHADONE HCL 5 MG PO TABS: 45.0000 mg | ORAL_TABLET | Freq: Every morning | ORAL | Status: DC

## 2014-11-01 NOTE — Progress Notes (Signed)
ANTICOAGULATION CONSULT NOTE - Initial Consult  Pharmacy Consult for Coumadin Indication: atrial fibrillation  Allergies  Allergen Reactions  . Codeine Rash    Patient Measurements: Weight: 199 lb 3.2 oz (90.357 kg)  Vital Signs: Temp: 99.2 F (37.3 C) (04/14 0125) Temp Source: Oral (04/14 0125) BP: 108/72 mmHg (04/14 0125) Pulse Rate: 78 (04/14 0125)  Labs:  Recent Labs  10/29/14 1051 10/30/14 1437 10/31/14 1822  HGB  --  12.8* 12.3*  HCT  --  38.1* 36.4*  PLT  --  138* 126*  INR 3.00  --   --   CREATININE  --  1.44* 1.39*    Estimated Creatinine Clearance: 54.3 mL/min (by C-G formula based on Cr of 1.39).   Medical History: Past Medical History  Diagnosis Date  . Cardiomyopathy     EF55% 11/14<<35%   . Substance abuse     alcohol  . Hypertension   . Atrial fibrillation /flutter     hx of flutter ablation 2/12  . Shortness of breath     "all the time lately" (12/14/2012)  . GERD (gastroesophageal reflux disease)   . Hepatitis     "think I had that once; a long time ago; from dirty needles I think" (12/14/2012)  . 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 Leukocytosis, unspecified 06/12/2013  . 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 Obesity   . Syncope   . Cancer   . CHF (congestive heart failure)   . Renal insufficiency   . COPD (chronic obstructive pulmonary disease)     Medications:  Prescriptions prior to admission  Medication Sig Dispense Refill Last Dose  . albuterol (PROAIR HFA) 108 (90 BASE) MCG/ACT inhaler Inhale 1-2 puffs into the lungs daily as needed for wheezing or shortness of breath. 18 g 5 10/31/2014 at Unknown time  . amiodarone (PACERONE) 200 MG tablet Take 1 tablet (200 mg total) by mouth daily. 30 tablet 0 10/31/2014 at Unknown time  . amLODipine (NORVASC) 5 MG tablet take 1 tablet by mouth once daily 30 tablet 2 10/31/2014 at Unknown time  . Budesonide (PULMICORT FLEXHALER) 90 MCG/ACT inhaler Inhale 1 puff into the lungs daily.     10/31/2014 at Unknown time  . carvedilol (COREG) 12.5 MG tablet Take 1 tablet (12.5 mg total) by mouth 2 (two) times daily with a meal. 60 tablet 2 10/31/2014 at 0900  . furosemide (LASIX) 40 MG tablet Take 1 tablet (40 mg total) by mouth daily. 30 tablet 1 10/31/2014 at Unknown time  . hydroxyurea (HYDREA) 500 MG capsule Take 2 capsules (1,000 mg total) by mouth daily. May take with food to minimize GI side effects. 90 capsule 3 10/31/2014 at Unknown time  . methadone (DOLOPHINE) 10 MG/5ML solution Take 46 mg by mouth every morning.    10/31/2014 at Unknown time  . warfarin (COUMADIN) 5 MG tablet Take 1 tablet (5 mg total) by mouth daily. 30 tablet 1 10/31/2014 at Unknown time  . tiotropium (SPIRIVA HANDIHALER) 18 MCG inhalation capsule Place 1 capsule (18 mcg total) into inhaler and inhale daily. 30 capsule 12 10/30/2014 at Unknown time   Scheduled:  . amiodarone  200 mg Oral Daily  . amLODipine  5 mg Oral Daily  . carvedilol  12.5 mg Oral BID WC  . doxycycline  100 mg Oral Q12H  . fluticasone  2 puff Inhalation BID  . furosemide  40 mg Oral Daily  . hydroxyurea  1,000 mg Oral Daily  . ipratropium-albuterol  3 mL Nebulization Q4H  .  methadone  46 mg Oral q morning - 10a  . sodium chloride  3 mL Intravenous Q12H  . tiotropium  18 mcg Inhalation Daily    Assessment: 69yo male c/o dry cough, abdominal pain, and HA x1d, to continue Coumadin for Afib during admission for COPD exacerbation; was seen at [facility] 2d ago w/ new Coumadin regimen.  Goal of Therapy:  INR 2-3   Plan:  Will continue home Coumadin dose of 2.5mg  po daily except 5mg  Mondays and monitor INR for dose adjustments.  Wynona Neat, PharmD, BCPS  11/01/2014,1:27 AM

## 2014-11-01 NOTE — Progress Notes (Signed)
Subjective: Feeling better today. Cough and breathing is lot better. Has some wheezing but better. Ambulated with RN and sat remain in 90-96% at room air this morning. Denies cp/fever/chills/n/v/diarrhea. Denies any pain, dysuria.   Objective: Vital signs in last 24 hours: Filed Vitals:   11/01/14 0015 11/01/14 0125 11/01/14 0449 11/01/14 0820  BP: 115/75 108/72 107/75 111/68  Pulse: 72 78 86 113  Temp:  99.2 F (37.3 C) 98.1 F (36.7 C) 98.2 F (36.8 C)  TempSrc:  Oral Oral Oral  Resp: 12   16  Height:  5\' 6"  (1.676 m)    Weight:  199 lb 3.2 oz (90.357 kg)    SpO2: 94% 93% 93% 100%   Weight change:  No intake or output data in the 24 hours ending 11/01/14 4431 Vitals reviewed. General: resting in bed, NAD HEENT: PERRL, EOMI, no scleral icterus Cardiac: RRR, no rubs, murmurs or gallops Pulm: +mild wheezing, no crackles. Normal WOB.  Abd: soft, nontender, nondistended, BS present Ext: warm and well perfused, no pedal edema Neuro: alert and oriented X3, cranial nerves II-XII grossly intact, strength and sensation to light touch equal in bilateral upper and lower extremities  Lab Results: Basic Metabolic Panel:  Recent Labs Lab 10/31/14 1822 11/01/14 0318  NA 138 137  K 3.7 3.9  CL 96 95*  CO2 31 30  GLUCOSE 105* 204*  BUN 29* 33*  CREATININE 1.39* 1.68*  CALCIUM 8.6 8.7  MG  --  2.0   Liver Function Tests:  Recent Labs Lab 10/31/14 1822  AST 37  ALT 30  ALKPHOS 70  BILITOT 0.7  PROT 7.4  ALBUMIN 3.3*    Recent Labs Lab 10/31/14 1822  LIPASE 15   No results for input(s): AMMONIA in the last 168 hours. CBC:  Recent Labs Lab 10/31/14 1822 11/01/14 0318  WBC 3.8* 2.6*  NEUTROABS 3.2 2.3  HGB 12.3* 11.8*  HCT 36.4* 35.6*  MCV 125.5* 126.7*  PLT 126* 105*  Coagulation:  Recent Labs Lab 10/29/14 1051 11/01/14 0318  LABPROT  --  23.6*  INR 3.00 2.08*   Anemia Panel: No results for input(s): VITAMINB12, FOLATE, FERRITIN, TIBC, IRON,  RETICCTPCT in the last 168 hours. Urine Drug Screen:  AUrinalysis:  Recent Labs Lab 10/31/14 2021  COLORURINE YELLOW  LABSPEC 1.024  PHURINE 5.0  GLUCOSEU NEGATIVE  HGBUR LARGE*  BILIRUBINUR NEGATIVE  KETONESUR NEGATIVE  PROTEINUR 30*  UROBILINOGEN 0.2  NITRITE NEGATIVE  LEUKOCYTESUR NEGATIVE   Micro Results: Recent Results (from the past 240 hour(s))  MRSA PCR Screening     Status: None   Collection Time: 11/01/14  1:41 AM  Result Value Ref Range Status   MRSA by PCR NEGATIVE NEGATIVE Final    Comment:        The GeneXpert MRSA Assay (FDA approved for NASAL specimens only), is one component of a comprehensive MRSA colonization surveillance program. It is not intended to diagnose MRSA infection nor to guide or monitor treatment for MRSA infections.    Studies/Results: Dg Chest 2 View  11/01/2014   CLINICAL DATA:  Shortness of breath  EXAM: CHEST  2 VIEW  COMPARISON:  10/30/2014  FINDINGS: No cardiomegaly. Negative aortic and hilar contours. Implantable event recorder again noted.  Minimal linear atelectasis or scarring at the bases. There is no edema, consolidation, effusion, or pneumothorax.  IMPRESSION: No acute findings.Minimal atelectasis or scarring at the bases.   Electronically Signed   By: Monte Fantasia M.D.   On: 11/01/2014  02:35   Dg Chest 2 View  10/30/2014   CLINICAL DATA:  Shortness of Breath.  History of cardiomyopathy  EXAM: CHEST  2 VIEW  COMPARISON:  Chest radiograph June 05, 2014 and chest CT June 05, 2014  FINDINGS: There is no edema or consolidation. Heart size and pulmonary vascularity are within normal limits. No adenopathy. An implanted monitor/event recorder device is present on the left.  IMPRESSION: No edema or consolidation.   Electronically Signed   By: Lowella Grip III M.D.   On: 10/30/2014 15:18   Medications: I have reviewed the patient's current medications. Scheduled Meds: . amiodarone  200 mg Oral Daily  . amLODipine  5  mg Oral Daily  . carvedilol  12.5 mg Oral BID WC  . doxycycline  100 mg Oral Q12H  . fluticasone  2 puff Inhalation BID  . hydroxyurea  1,000 mg Oral Daily  . ipratropium-albuterol  3 mL Nebulization QID  . sodium chloride  3 mL Intravenous Q12H  . tiotropium  18 mcg Inhalation Daily  . warfarin  2.5 mg Oral Once per day on Sun Tue Wed Thu Fri Sat  . [START ON 11/05/2014] warfarin  5 mg Oral Q Mon-1800  . Warfarin - Pharmacist Dosing Inpatient   Does not apply q1800   Continuous Infusions: . sodium chloride 50 mL/hr at 11/01/14 0623   PRN Meds:.albuterol, benzonatate Assessment/Plan: Principal Problem:   Dyspnea Active Problems:   Atrial fibrillation   Hypertension   Tobacco abuse   Polycythemia vera   Chronic systolic heart failure   Myeloproliferative neoplasm   History of heroin abuse   Chronic anticoagulation   Thrombocytopenia due to drugs   COPD exacerbation   AKI (acute kidney injury)  69 yo male with COPD gold stage 3, diastolic CHF, Afib, hx of polychythemia vera here with COPD exc.  AEoCOPD - exc likely 2/2 to viral etiology, few children were sick around him with viral infection.  - was hypoxic but now improving. Had wheezing and SOB which has improved.  Flu neg,  -cont doxycycline (less interaction with coumadin) for COPD exc treatment, cont prednisone 60mg  daily today (got solumedrol 125 once). - cont duoneb, spiriva. D/c flovent (doesn't have any additional benefit already on prednisone).  - tessalon for cough, f/up sputum cx.  AKI - volume depletion, b/l crt 1.0 came in with crt 1.39 now 1.68 - cont fluid. Check BMET.   Diastolic CHF - EF 41-28%, grade 1 diastolic dysfunction 78/6767 echo - on lasix ath ome. Holding home lasix 40mg  since he is euvolemic now.  Atrial Fibrillation: Patient is on coumadin lifelong with goal 2-3 and amiodarone. - Continue amiodarone 200 mg daily - Resume coumadin per pharmacy  Hypertension: running low in mid 90-100's.    On carvedilol and amlodipoine at home. Hold for now.   Chronic microscopic Hematuria: no symptoms. Present on UA today, and has been chronic for him. - repeat UA. May need outpatient follow up Urology.  Polycythemia Vera: Patient has JAK2 mutation positive myeloproliferative disorder. He takes hydroxyurea. 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 Patient's polycythemia treatments contribute to his anemia.   History of Substance Abuse: Prior alcohol/heroin abuse. Now reportedly on methadone 43 mg [facility]), but UDS negative for opiates. Patient said his last methadone dose (46 mg daily) was day of admission. - hold methadone or now.  Could not reach the clinic, left voicemail.    Dispo: Disposition is deferred at this time, awaiting improvement of current medical problems.  Anticipated discharge in approximately 1-2 day(s).   The patient does have a current PCP Cresenciano Genre, MD) and does need an [facility] hospital follow-up appointment after discharge.  The patient does have transportation limitations that hinder transportation to clinic appointments.  .Services Needed at time of discharge: Y = Yes, Blank = No PT:   OT:   RN:   Equipment:   Other:       Dellia Nims, MD 11/01/2014, 9:52 AM

## 2014-11-01 NOTE — Care Management Note (Unsigned)
    Page 1 of 1   11/01/2014     2:45:06 PM CARE MANAGEMENT NOTE 11/01/2014  Patient:  [name], [name]   Account Number:  [id number]  Date Initiated:  11/01/2014  Documentation initiated by:  Lamar Meter  Subjective/Objective Assessment:   Pt adm on 10/31/14 with COPD, r/o flu.  PTA, pt independent, lives with family.     Action/Plan:   Will follow for dc [name] as pt progresses.   Anticipated DC Date:  11/02/2014   Anticipated DC Plan:  [place]  CM consult      Choice offered to / List presented to:             Status of service:  In process, will continue to follow Medicare Important Message given?   (If response is "NO", the following Medicare IM given date fields will be blank) Date Medicare IM given:   Medicare IM given by:   Date Additional Medicare IM given:   Additional Medicare IM given by:    Discharge Disposition:    Per UR Regulation:  Reviewed for med. necessity/level of care/duration of stay  If discussed at [place] of Stay Meetings, dates discussed:    Comments:

## 2014-11-01 NOTE — Progress Notes (Signed)
UR completed 

## 2014-11-02 ENCOUNTER — Encounter: Payer: Self-pay | Admitting: Internal Medicine

## 2014-11-02 DIAGNOSIS — J441 Chronic obstructive pulmonary disease with (acute) exacerbation: Secondary | ICD-10-CM | POA: Diagnosis not present

## 2014-11-02 LAB — CBC WITH DIFFERENTIAL/PLATELET
Basophils Absolute: 0 10*3/uL (ref 0.0–0.1)
Basophils Relative: 0 % (ref 0–1)
EOS ABS: 0 10*3/uL (ref 0.0–0.7)
Eosinophils Relative: 0 % (ref 0–5)
HCT: 33 % — ABNORMAL LOW (ref 39.0–52.0)
Hemoglobin: 11.2 g/dL — ABNORMAL LOW (ref 13.0–17.0)
LYMPHS PCT: 4 % — AB (ref 12–46)
Lymphs Abs: 0.3 10*3/uL — ABNORMAL LOW (ref 0.7–4.0)
MCH: 42.9 pg — ABNORMAL HIGH (ref 26.0–34.0)
MCHC: 33.9 g/dL (ref 30.0–36.0)
MCV: 126.4 fL — ABNORMAL HIGH (ref 78.0–100.0)
MONOS PCT: 5 % (ref 3–12)
Monocytes Absolute: 0.3 10*3/uL (ref 0.1–1.0)
NEUTROS PCT: 91 % — AB (ref 43–77)
Neutro Abs: 6.1 10*3/uL (ref 1.7–7.7)
Platelets: 107 10*3/uL — ABNORMAL LOW (ref 150–400)
RBC: 2.61 MIL/uL — ABNORMAL LOW (ref 4.22–5.81)
RDW: 13.2 % (ref 11.5–15.5)
WBC: 6.7 10*3/uL (ref 4.0–10.5)

## 2014-11-02 LAB — BASIC METABOLIC PANEL
Anion gap: 9 (ref 5–15)
BUN: 33 mg/dL — AB (ref 6–23)
CO2: 29 mmol/L (ref 19–32)
Calcium: 8.1 mg/dL — ABNORMAL LOW (ref 8.4–10.5)
Chloride: 98 mmol/L (ref 96–112)
Creatinine, Ser: 1.12 mg/dL (ref 0.50–1.35)
GFR calc Af Amer: 77 mL/min — ABNORMAL LOW (ref >90)
GFR, EST NON AFRICAN AMERICAN: 66 mL/min — AB (ref >90)
Glucose, Bld: 110 mg/dL — ABNORMAL HIGH (ref 70–99)
Potassium: 4.2 mmol/L (ref 3.5–5.1)
SODIUM: 136 mmol/L (ref 135–145)

## 2014-11-02 LAB — PROTIME-INR
INR: 2 — ABNORMAL HIGH (ref 0.00–1.49)
PROTHROMBIN TIME: 22.8 s — AB (ref 11.6–15.2)

## 2014-11-02 LAB — HEMOGLOBIN A1C
Hgb A1c MFr Bld: 5.5 % (ref 4.8–5.6)
Mean Plasma Glucose: 111 mg/dL

## 2014-11-02 LAB — UREA NITROGEN, URINE: Urea Nitrogen, Ur: 1328 mg/dL

## 2014-11-02 MED ORDER — DOXYCYCLINE HYCLATE 100 MG PO TABS: 100.0000 mg | ORAL_TABLET | Freq: Two times a day (BID) | ORAL | Status: AC

## 2014-11-02 MED ORDER — PREDNISONE 20 MG PO TABS: ORAL_TABLET | ORAL | Status: AC

## 2014-11-02 MED ORDER — METHADONE HCL 5 MG PO TABS
45.0000 mg | ORAL_TABLET | Freq: Every day | ORAL | Status: DC
  Administered 2014-11-02: 45 mg via ORAL
  Filled 2014-11-02: qty 9

## 2014-11-02 MED ORDER — BENZONATATE 100 MG PO CAPS: 100.0000 mg | ORAL_CAPSULE | Freq: Two times a day (BID) | ORAL | Status: DC | PRN

## 2014-11-02 NOTE — Progress Notes (Signed)
UR completed 

## 2014-11-02 NOTE — Discharge Instructions (Addendum)
You were admitted for COPD exacerbation.   Please keep taking your inhalers.  doxycycline antibiotic: Take once at night on 4/15 night, then twice daily on 4/16 and 4/17.  prednisone: Take 40mg  4/16 and 4/17, take 20 mg 4/18 and 4/19, then stop. Take in the morning with breakfast.  Follow up closely at the clinic.

## 2014-11-02 NOTE — Progress Notes (Signed)
[place] for Coumadin Indication: atrial fibrillation  Allergies  Allergen Reactions  . Codeine Rash    Patient Measurements: Height: 5\' 6"  (167.6 cm) Weight: 205 lb 1.6 oz (93.033 kg) IBW/kg (Calculated) : 63.8  Vital Signs: Temp: 97.7 F (36.5 C) (04/15 0549) Temp Source: Oral (04/15 0549) BP: 115/73 mmHg (04/15 0549) Pulse Rate: 72 (04/15 0549)  Labs:  Recent Labs  10/31/14 1822 11/01/14 0318 11/01/14 1530 11/02/14 0456  HGB 12.3* 11.8*  --  11.2*  HCT 36.4* 35.6*  --  33.0*  PLT 126* 105*  --  107*  LABPROT  --  23.6*  --  22.8*  INR  --  2.08*  --  2.00*  CREATININE 1.39* 1.68* 1.57* 1.12    Estimated Creatinine Clearance: 68.3 mL/min (by C-G formula based on Cr of 1.12).   Assessment: 69yo male c/o dry cough, abdominal pain, and HA x1d, to continue Coumadin for Afib during admission for COPD exacerbation; was seen at [facility] 2d ago w/ new Coumadin regimen. INR is therapeutic today at 2.0.  CBC stable, pltc continues to be low. No bleeding reported.   Goal of Therapy:  INR 2-3   Plan:  continue home Coumadin dose of 2.5mg  po daily except 5mg  Mondays Daily INR, decrease if stable  Eudelia Bunch, Pharm.D. [phone] 11/02/2014 11:23 AM

## 2014-11-02 NOTE — Progress Notes (Signed)
Subjective: Feeling better today. Cough and breathing is lot better. No longer has wheezing.  Denies cp/fever/chills/n/v/diarrhea. Denies any pain, dysuria.  Vitals stable and satting good on room air.  Objective: Vital signs in last 24 hours: Filed Vitals:   11/01/14 1803 11/01/14 2039 11/02/14 0215 11/02/14 0549  BP: 103/84 126/74 122/67 115/73  Pulse: 68 58 57 72  Temp: 97.6 F (36.4 C) 97.7 F (36.5 C) 97.9 F (36.6 C) 97.7 F (36.5 C)  TempSrc: Oral Oral Oral Oral  Resp:  20 17 19   Height:      Weight:    205 lb 1.6 oz (93.033 kg)  SpO2: 97% 97% 98% 100%   Weight change: 5 lb 14.4 oz (2.676 kg)  Intake/Output Summary (Last 24 hours) at 11/02/14 0756 Last data filed at 11/02/14 0732  Gross per 24 hour  Intake 2235.5 ml  Output   1850 ml  Net  385.5 ml   Vitals reviewed. General: resting in bed, NAD HEENT: PERRL, EOMI, no scleral icterus Cardiac: RRR, no rubs, murmurs or gallops Pulm: CTAB, no wheezing, no crackles. Normal WOB.  Abd: soft, nontender, nondistended, BS present Ext: warm and well perfused, no pedal edema Neuro: alert and oriented X3, cranial nerves II-XII grossly intact, strength and sensation to light touch equal in bilateral upper and lower extremities  Lab Results: Basic Metabolic Panel:  Recent Labs Lab 11/01/14 0318 11/01/14 1530 11/02/14 0456  NA 137 136 136  K 3.9 4.2 4.2  CL 95* 98 98  CO2 30 29 29   GLUCOSE 204* 125* 110*  BUN 33* 36* 33*  CREATININE 1.68* 1.57* 1.12  CALCIUM 8.7 8.0* 8.1*  MG 2.0  --   --    Liver Function Tests:  Recent Labs Lab 10/31/14 1822  AST 37  ALT 30  ALKPHOS 70  BILITOT 0.7  PROT 7.4  ALBUMIN 3.3*    Recent Labs Lab 10/31/14 1822  LIPASE 15   No results for input(s): AMMONIA in the last 168 hours. CBC:  Recent Labs Lab 11/01/14 0318 11/02/14 0456  WBC 2.6* 6.7  NEUTROABS 2.3 6.1  HGB 11.8* 11.2*  HCT 35.6* 33.0*  MCV 126.7* 126.4*  PLT 105* 107*  Coagulation:  Recent  Labs Lab 10/29/14 1051 11/01/14 0318 11/02/14 0456  LABPROT  --  23.6* 22.8*  INR 3.00 2.08* 2.00*   Anemia Panel: No results for input(s): VITAMINB12, FOLATE, FERRITIN, TIBC, IRON, RETICCTPCT in the last 168 hours. Urine Drug Screen:  AUrinalysis:  Recent Labs Lab 10/31/14 2021 11/01/14 0900  COLORURINE YELLOW YELLOW  LABSPEC 1.024 1.022  PHURINE 5.0 5.0  GLUCOSEU NEGATIVE NEGATIVE  HGBUR LARGE* TRACE*  BILIRUBINUR NEGATIVE NEGATIVE  KETONESUR NEGATIVE 15*  PROTEINUR 30* NEGATIVE  UROBILINOGEN 0.2 0.2  NITRITE NEGATIVE NEGATIVE  LEUKOCYTESUR NEGATIVE NEGATIVE   Micro Results: Recent Results (from the past 240 hour(s))  MRSA PCR Screening     Status: None   Collection Time: 11/01/14  1:41 AM  Result Value Ref Range Status   MRSA by PCR NEGATIVE NEGATIVE Final    Comment:        The GeneXpert MRSA Assay (FDA approved for NASAL specimens only), is one component of a comprehensive MRSA colonization surveillance program. It is not intended to diagnose MRSA infection nor to guide or monitor treatment for MRSA infections.    Studies/Results: Dg Chest 2 View  11/01/2014   CLINICAL DATA:  Shortness of breath  EXAM: CHEST  2 VIEW  COMPARISON:  10/30/2014  FINDINGS: No cardiomegaly. Negative aortic and hilar contours. Implantable event recorder again noted.  Minimal linear atelectasis or scarring at the bases. There is no edema, consolidation, effusion, or pneumothorax.  IMPRESSION: No acute findings.Minimal atelectasis or scarring at the bases.   Electronically Signed   By: Monte Fantasia M.D.   On: 11/01/2014 02:35   Medications: I have reviewed the patient's current medications. Scheduled Meds: . amiodarone  200 mg Oral Daily  . doxycycline  100 mg Oral Q12H  . hydroxyurea  1,000 mg Oral Daily  . methadone  46 mg Oral q morning - 10a  . predniSONE  60 mg Oral Q breakfast  . sodium chloride  3 mL Intravenous Q12H  . tiotropium  18 mcg Inhalation Daily  . warfarin   2.5 mg Oral Once per day on Sun Tue Wed Thu Fri Sat  . [START ON 11/05/2014] warfarin  5 mg Oral Q Mon-1800  . Warfarin - Pharmacist Dosing Inpatient   Does not apply q1800   Continuous Infusions:   PRN Meds:.benzonatate, ipratropium-albuterol Assessment/Plan: Principal Problem:   Dyspnea Active Problems:   Atrial fibrillation   Hypertension   Tobacco abuse   Polycythemia vera   Chronic systolic heart failure   Myeloproliferative neoplasm   History of heroin abuse   Chronic anticoagulation   Thrombocytopenia due to drugs   COPD exacerbation   AKI (acute kidney injury)  69 yo male with COPD gold stage 3, diastolic CHF, Afib, hx of polychythemia vera here with COPD exc.  AEoCOPD - exc likely 2/2 to viral etiology, few children were sick around him with viral infection.  - was hypoxic but now resolved. Ambulated fine without desatting. Had wheezing and SOB, resolved. Flu neg,  -cont doxycycline 100mg  BID (less interaction with coumadin) for COPD exc treatment ending on 10/25/14.  - got solumedrol 125 once and then prednisone 60mg  2 days. Will taper to 40, 40, 20, 20, then stop. - cont duoneb, spiriva. Resume home inhalers on discharge.   - tessalon for cough, f/up sputum cx.  AKI - volume depletion, b/l crt 1.0 came in with crt 1.39, today back to baseline almost 1.12. - cont fluid. Recheck BMET on follow up outpatient.  Diastolic CHF - EF 64-33%, grade 1 diastolic dysfunction 29/5188 echo - on lasix at home. Holding home lasix 40mg  since he is euvolemic now.  Atrial Fibrillation: Patient is on coumadin lifelong with goal 2-3 and amiodarone. - Continue amiodarone 200 mg daily - Resumed coumadin per pharmacy  Hypertension: running low in mid 90-100's.  On carvedilol and amlodipoine at home. Hold for now.    Chronic microscopic Hematuria: no symptoms. Present on admission has been chronic for him. Repeat UA shows only trace hgb. - repeat UA outpatient. May need outpatient  follow up Urology if persistent.   Polycythemia Vera: Patient has JAK2 mutation positive myeloproliferative disorder. He takes hydroxyurea. 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  Anemia of Neoplastic Disease: Hemoglobin is at baseline 11-12. - Patient's polycythemia treatments contribute to his anemia.   History of Substance Abuse: Prior alcohol/heroin abuse. Confirmed dosing with [address] clinic, is 46mg  daily. - cont methadone 46 mg daily.  Dispo: Disposition is deferred at this time, awaiting improvement of current medical problems.  Anticipated discharge in approximately 1-2 day(s).   The patient does have a current PCP Cresenciano Genre, MD) and does need an [facility] hospital follow-up appointment after discharge.  The patient does have transportation limitations that hinder transportation to clinic appointments.  .Services Needed at time of discharge: Y = Yes, Blank = No PT:   OT:   RN:   Equipment:   Other:       Dellia Nims, MD 11/02/2014, 7:56 AM

## 2014-11-02 NOTE — Discharge Summary (Signed)
Name: [name] MRN: [id number] DOB: [birth year] 69 y.o. PCP: Cresenciano Genre, MD  Date of Admission: 10/31/2014  7:52 PM Date of Discharge: 11/02/2014 Attending Physician: Bertha Stakes, MD  Discharge Diagnosis:  Principal Problem:   COPD Exacerbation  Active Problems:   Atrial fibrillation   Hypertension   Tobacco abuse   Polycythemia vera   Chronic systolic heart failure   Myeloproliferative neoplasm   History of heroin abuse   Chronic anticoagulation   Thrombocytopenia due to drugs   AKI (acute kidney injury)  Discharge Medications:   Medication List    STOP taking these medications        amLODipine 5 MG tablet  Commonly known as:  NORVASC     carvedilol 12.5 MG tablet  Commonly known as:  COREG     furosemide 40 MG tablet  Commonly known as:  LASIX      TAKE these medications        albuterol 108 (90 BASE) MCG/ACT inhaler  Commonly known as:  PROAIR HFA  Inhale 1-2 puffs into the lungs daily as needed for wheezing or shortness of breath.     amiodarone 200 MG tablet  Commonly known as:  PACERONE  Take 1 tablet (200 mg total) by mouth daily.     benzonatate 100 MG capsule  Commonly known as:  TESSALON  Take 1 capsule (100 mg total) by mouth 2 (two) times daily as needed for cough.     doxycycline 100 MG tablet  Commonly known as:  VIBRA-TABS  Take 1 tablet (100 mg total) by mouth every 12 (twelve) hours.     hydroxyurea 500 MG capsule  Commonly known as:  HYDREA  Take 2 capsules (1,000 mg total) by mouth daily. May take with food to minimize GI side effects.     methadone 10 MG/5ML solution  Commonly known as:  DOLOPHINE  Take 46 mg by mouth every morning.     predniSONE 20 MG tablet  Commonly known as:  DELTASONE  Take 40mg  4/16 and 4/17, take 20 mg 4/18 and 4/19, then stop. Take in the morning with breakfast.  Start taking on:  11/03/2014     PULMICORT FLEXHALER 90 MCG/ACT inhaler  Generic drug:  Budesonide  Inhale 1 puff  into the lungs daily.     tiotropium 18 MCG inhalation capsule  Commonly known as:  SPIRIVA HANDIHALER  Place 1 capsule (18 mcg total) into inhaler and inhale daily.     warfarin 5 MG tablet  Commonly known as:  COUMADIN  Take 1 tablet (5 mg total) by mouth daily.        Disposition and follow-up:   Mr.Waylyn Buell Parcel was discharged from [facility] in Stable condition.  At the hospital follow up visit please address:  1.  Came in with COPD exacerbation. Will finish prednisione taper 40, 40, 20, 20, stop on 4/19. Will finish levaquin on 4/17. We held his amlodipine, lasix, and coreg as his BP was low and also b/c of AKI. Consider restarting on follow up. Needs repeat UA to make sure microscopic hematuria is resolving. Guadalupe Dawn would need outpatient urology appt.  2.  Labs / imaging needed at time of follow-up: BMET, UA  3.  Pending labs/ test needing follow-up:   Follow-up Appointments:     Follow-up Information    Follow up with Cresenciano Genre, MD On 11/09/2014.   Specialty:  Internal Medicine   Why:  @ 2:15 pm.  Contact information:   [place] [address] [phone]       Discharge Instructions:   Consultations:    Procedures Performed:  Dg Chest 2 View  11/01/2014   CLINICAL DATA:  Shortness of breath  EXAM: CHEST  2 VIEW  COMPARISON:  10/30/2014  FINDINGS: No cardiomegaly. Negative aortic and hilar contours. Implantable event recorder again noted.  Minimal linear atelectasis or scarring at the bases. There is no edema, consolidation, effusion, or pneumothorax.  IMPRESSION: No acute findings.Minimal atelectasis or scarring at the bases.   Electronically Signed   By: Monte Fantasia M.D.   On: 11/01/2014 02:35   Dg Chest 2 View  10/30/2014   CLINICAL DATA:  Shortness of Breath.  History of cardiomyopathy  EXAM: CHEST  2 VIEW  COMPARISON:  Chest radiograph June 05, 2014 and chest CT June 05, 2014  FINDINGS: There is no  edema or consolidation. Heart size and pulmonary vascularity are within normal limits. No adenopathy. An implanted monitor/event recorder device is present on the left.  IMPRESSION: No edema or consolidation.   Electronically Signed   By: Lowella Grip III M.D.   On: 10/30/2014 15:18   Admission HPI:   [name] is a 69 yo man with a history of COPD Gold Stage III, systolic CHF, atrial fibrillation (on amiodarone and warfarin), hypertension and polycythemia vera who was admitted for COPD exacerbation. He noticed some shortness of breath and that he was coughing more than usual over the past 2-3 days. His cough was dry and not accompanied by subjective fever, chills, sore throat, body aches or congestion. His grandchildren had a cold last week. He is currently smoking 1-2 cigarettes per day and does not use home oxygen. He received his flu shot this year.  He last had PFTs on 05/31/15: FEV1/FVC 0.58, FEV1 45% of predicted. Over the past several years, he has had 1-2 COPD exacerbations per year. He manages his COPD at home with albuterol, budesonide and Spiriva inhalers.  Hospital Course by problem list:   AEoCOPD - exc likely 2/2 to viral etiology, few children were sick around him with viral infection.  - was hypoxic but now resolved. Ambulated fine without desatting. Had wheezing and SOB, resolved. Flu neg,  -cont doxycycline 100mg  BID (less interaction with coumadin) for COPD exc treatment ending on 10/25/14.  - got solumedrol 125 once and then prednisone 60mg  2 days. Will taper to 40, 40, 20, 20, then stop. - cont duoneb, spiriva. Resume home inhalers on discharge.  - tessalon for cough, f/up sputum cx.  AKI - volume depletion, b/l crt 1.0 came in with crt 1.39, today back to baseline almost 1.12. - improved with fluid. Recheck BMET on follow up outpatient.  Diastolic CHF - EF 37-90%, grade 1 diastolic dysfunction 24/0973 echo - on lasix at home. Holding home lasix  40mg  since he is euvolemic now and came in with AKI/volume depletion. Consider restarting outpatient.  Atrial Fibrillation: Patient is on coumadin lifelong with goal 2-3 and amiodarone. - Continue amiodarone 200 mg daily - Resumed coumadin per pharmacy  Hypertension: running low in mid 90-100's.  On carvedilol and amlodipoine at home. Hold for now as BP low. Consider restarting outpatient.  Chronic microscopic Hematuria: no symptoms. Present on admission has been chronic for him. Repeat UA shows only trace hgb. - repeat UA outpatient. May need outpatient follow up Urology if persistent.   Polycythemia Vera: Patient has JAK2 mutation positive myeloproliferative disorder. He  takes hydroxyurea. 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  Anemia of Neoplastic Disease: Hemoglobin is at baseline 11-12. - Patient's polycythemia treatments contribute to his anemia. stable.   History of Substance Abuse: Prior alcohol/heroin abuse. Confirmed dosing with [address] clinic, is 46mg  daily. - cont methadone 46 mg daily.  Discharge Vitals:   BP 111/54 mmHg  Pulse 58  Temp(Src) 97.8 F (36.6 C) (Oral)  Resp 20  Ht 5\' 6"  (1.676 m)  Wt 205 lb 1.6 oz (93.033 kg)  BMI 33.12 kg/m2  SpO2 95%  Discharge Labs:  Results for orders placed or performed during the hospital encounter of 10/31/14 (from the past 24 hour(s))  Basic metabolic panel     Status: Abnormal   Collection Time: 11/01/14  3:30 PM  Result Value Ref Range   Sodium 136 135 - 145 mmol/L   Potassium 4.2 3.5 - 5.1 mmol/L   Chloride 98 96 - 112 mmol/L   CO2 29 19 - 32 mmol/L   Glucose, Bld 125 (H) 70 - 99 mg/dL   BUN 36 (H) 6 - 23 mg/dL   Creatinine, Ser 1.57 (H) 0.50 - 1.35 mg/dL   Calcium 8.0 (L) 8.4 - 10.5 mg/dL   GFR calc non Af Amer 44 (L) >90 mL/min   GFR calc Af Amer 51 (L) >90 mL/min   Anion gap 9 5 - 15  Protime-INR     Status: Abnormal    Collection Time: 11/02/14  4:56 AM  Result Value Ref Range   Prothrombin Time 22.8 (H) 11.6 - 15.2 seconds   INR 2.00 (H) 0.00 - 1.49  CBC with Differential/Platelet     Status: Abnormal   Collection Time: 11/02/14  4:56 AM  Result Value Ref Range   WBC 6.7 4.0 - 10.5 K/uL   RBC 2.61 (L) 4.22 - 5.81 MIL/uL   Hemoglobin 11.2 (L) 13.0 - 17.0 g/dL   HCT 33.0 (L) 39.0 - 52.0 %   MCV 126.4 (H) 78.0 - 100.0 fL   MCH 42.9 (H) 26.0 - 34.0 pg   MCHC 33.9 30.0 - 36.0 g/dL   RDW 13.2 11.5 - 15.5 %   Platelets 107 (L) 150 - 400 K/uL   Neutrophils Relative % 91 (H) 43 - 77 %   Lymphocytes Relative 4 (L) 12 - 46 %   Monocytes Relative 5 3 - 12 %   Eosinophils Relative 0 0 - 5 %   Basophils Relative 0 0 - 1 %   Neutro Abs 6.1 1.7 - 7.7 K/uL   Lymphs Abs 0.3 (L) 0.7 - 4.0 K/uL   Monocytes Absolute 0.3 0.1 - 1.0 K/uL   Eosinophils Absolute 0.0 0.0 - 0.7 K/uL   Basophils Absolute 0.0 0.0 - 0.1 K/uL   RBC Morphology POLYCHROMASIA PRESENT   Basic metabolic panel     Status: Abnormal   Collection Time: 11/02/14  4:56 AM  Result Value Ref Range   Sodium 136 135 - 145 mmol/L   Potassium 4.2 3.5 - 5.1 mmol/L   Chloride 98 96 - 112 mmol/L   CO2 29 19 - 32 mmol/L   Glucose, Bld 110 (H) 70 - 99 mg/dL   BUN 33 (H) 6 - 23 mg/dL   Creatinine, Ser 1.12 0.50 - 1.35 mg/dL   Calcium 8.1 (L) 8.4 - 10.5 mg/dL   GFR calc non Af Amer 66 (L) >90 mL/min   GFR calc Af Amer 77 (L) >  90 mL/min   Anion gap 9 5 - 15    Signed: Dellia Nims, MD 11/02/2014, 3:09 PM    Services Ordered on Discharge:  Equipment Ordered on Discharge:

## 2014-11-02 NOTE — Progress Notes (Signed)
Internal Medicine Attending  Date: 11/02/2014  Patient name: [name] [facility] Medical record number: [id number] Date of birth: 25-Feb-1946 Age: 69 y.o. Gender: male  I saw and evaluated the patient, and discussed his care with resident on A.M rounds.  I reviewed the resident's note by Dr. Genene Churn and I agree with the resident's findings and plans as documented in his note, with the following additional comments.  Patient was doing well, with no shortness of breath; lung exam was clear.  He felt ready to go home, and our assessment was that he was stable for discharge.  We discussed his discharge treatment plans and follow-up with him at bedside.

## 2014-11-03 LAB — URINE CULTURE

## 2014-11-09 ENCOUNTER — Ambulatory Visit (INDEPENDENT_AMBULATORY_CARE_PROVIDER_SITE_OTHER): Payer: Medicare Other | Admitting: Internal Medicine

## 2014-11-09 ENCOUNTER — Encounter: Payer: Self-pay | Admitting: Internal Medicine

## 2014-11-09 VITALS — BP 126/63 | HR 50 | Temp 98.0°F | Ht 66.0 in | Wt 212.4 lb

## 2014-11-09 DIAGNOSIS — I10 Essential (primary) hypertension: Secondary | ICD-10-CM | POA: Diagnosis not present

## 2014-11-09 DIAGNOSIS — R319 Hematuria, unspecified: Secondary | ICD-10-CM | POA: Diagnosis not present

## 2014-11-09 DIAGNOSIS — J449 Chronic obstructive pulmonary disease, unspecified: Secondary | ICD-10-CM

## 2014-11-09 DIAGNOSIS — F17210 Nicotine dependence, cigarettes, uncomplicated: Secondary | ICD-10-CM | POA: Diagnosis not present

## 2014-11-09 NOTE — Progress Notes (Signed)
   Subjective:    Patient ID: [name], male    DOB: 1946-05-04, 69 y.o.   MRN: [id number]  HPI Comments: 69 y.o chronic AF on Coumadin, HTN (BP 126/63 today), myeloproliferative disease/polycythemia vera/thrombocytopenia( following with H/O 08/2014 thought could be 2/2 amio vs hydrea (hydrea dose reduced recently to 2 tablets)), chronic systolic HF (last EF 28-76% improved from 08/2012 with EF 81-15%; grade 1 diastolic 72/62/0355), h/o alcohol/heroin abuse now on methadone (gets from [facility] clinic), CKD 3   He presents for f/u from hospital  1. H/o HTN-BP 126/63 today off norvasc 5, Coreg 12.5 mg bid, Lasix 40 mg since hospital discharge.  He had AKI which resolved by hospital discharge and hypotension that admission.  We will continue to hold for now and add in the future low dose BB 2. F/u for COPD exacerbation-He is feeling well completed Abx, still coughing with dry cough, sob improved.  Still smoking 1 cig per day.  He is using Albuterol and Spirivia and has Rx for pulmicort.          Review of Systems  Respiratory: Negative for shortness of breath.   Cardiovascular: Negative for chest pain.  Gastrointestinal: Negative for abdominal pain.       Objective:   Physical Exam  Constitutional: He is oriented to person, place, and time. He appears well-developed and well-nourished. He is cooperative.  HENT:  Head: Normocephalic and atraumatic.  Eyes: Conjunctivae are normal. Right eye exhibits no discharge. Left eye exhibits no discharge. No scleral icterus.  Cardiovascular: Regular rhythm and normal heart sounds.  Bradycardia present.   No murmur heard. Trace edema b/l  Pulmonary/Chest: Effort normal.  Mild exp wheezing b/l   Abdominal: Soft. Bowel sounds are normal. He exhibits no distension. There is no tenderness.  Musculoskeletal: He exhibits no edema.  Neurological: He is alert and oriented to person, place, and time. Gait normal.  Skin: Skin is warm and  dry. No rash noted.     Psychiatric: He has a normal mood and affect. His speech is normal and behavior is normal. Judgment and thought content normal. Cognition and memory are normal.  Nursing note and vitals reviewed.         Assessment & Plan:  F/u in 3-4 months; 11/12/14 with Dr. Elie Confer

## 2014-11-09 NOTE — Assessment & Plan Note (Addendum)
BP Readings from Last 3 Encounters:  11/09/14 126/63  11/02/14 111/54  10/30/14 159/74    Lab Results  Component Value Date   NA 136 11/02/2014   K 4.2 11/02/2014   CREATININE 1.12 11/02/2014    Assessment: Blood pressure control: controlled Progress toward BP goal:  at goal Comments: none  Plan: Medications:  off Norvasc 5, Lasix 40, Coreg 12.5 mg bid BP 126/63.  Other plans: will continue to hold for now.  At f/u appt if BP elevated consider readding low dose BB (Coreg). Also pt needs back low dose ACEI in future with h/o sHF (03/2013) at one time on Lisnopril 5 mg Check BMET at f/u in 3-4 months last BMET 11/02/14 overall wnl

## 2014-11-09 NOTE — Assessment & Plan Note (Addendum)
Recent admission with exacerbation, resolved  Emphasized compliance with medications/inhalers (Pulmicort, Spiriva, prn Albuterol) Counseled smoking cessation (smoking 1 cig/day). He does not want to try patches at the moment

## 2014-11-09 NOTE — Progress Notes (Signed)
Internal Medicine Clinic Attending  Case discussed with Dr. McLean at the time of the visit.  We reviewed the resident's history and exam and pertinent patient test results.  I agree with the assessment, diagnosis, and plan of care documented in the resident's note. 

## 2014-11-09 NOTE — Assessment & Plan Note (Addendum)
Noted 10/2014 hospitalization and chronic since at least 08/2012 when started coumadin  Will need repeat UA in future(11/12/14) will collected   if + will need referral to Urology for further w/u

## 2014-11-09 NOTE — Patient Instructions (Addendum)
General Instructions: Please follow up in 3-4 months Sooner if needed Make sure you are taking Pulmicort, Albuterol, Spiriva inhalers  Try to stop smoking Take care    Treatment Goals:  Goals (1 Years of Data) as of 11/09/14          As of Today 11/02/14 11/02/14 11/02/14 11/01/14     Blood Pressure   . Blood Pressure < 140/90  126/63 111/54 115/73 122/67 126/74      Progress Toward Treatment Goals:  Treatment Goal 11/09/2014  Blood pressure at goal  Stop smoking smoking less    Self Care Goals & Plans:  Self Care Goal 11/09/2014  Manage my medications take my medicines as prescribed; bring my medications to every visit; refill my medications on time; follow the sick day instructions if I am sick  Monitor my health keep track of my blood pressure  Eat healthy foods drink diet soda or water instead of juice or soda; eat more vegetables; eat foods that are low in salt; eat baked foods instead of fried foods; eat fruit for snacks and desserts; eat smaller portions  Be physically active find an activity I enjoy  Stop smoking call [facility] (1-800-QUIT-NOW)  Meeting treatment goals maintain the current self-care plan    No flowsheet data found.   Care Management & Community Referrals:  Referral 11/09/2014  Referrals made for care management support none needed  Referrals made to community resources none       Chronic Obstructive Pulmonary Disease Chronic obstructive pulmonary disease (COPD) is a common lung condition in which airflow from the lungs is limited. COPD is a general term that can be used to describe many different lung problems that limit airflow, including both chronic bronchitis and emphysema. If you have COPD, your lung function will probably never return to normal, but there are measures you can take to improve lung function and make yourself feel better.  CAUSES   Smoking (common).   Exposure to secondhand smoke.   Genetic problems.  Chronic  inflammatory lung diseases or recurrent infections. SYMPTOMS   Shortness of breath, especially with physical activity.   Deep, persistent (chronic) cough with a large amount of thick mucus.   Wheezing.   Rapid breaths (tachypnea).   Gray or bluish discoloration (cyanosis) of the skin, especially in fingers, toes, or lips.   Fatigue.   Weight loss.   Frequent infections or episodes when breathing symptoms become much worse (exacerbations).   Chest tightness. DIAGNOSIS  Your health care provider will take a medical history and perform a physical examination to make the initial diagnosis. Additional tests for COPD may include:   Lung (pulmonary) function tests.  Chest X-ray.  CT scan.  Blood tests. TREATMENT  Treatment available to help you feel better when you have COPD includes:   Inhaler and nebulizer medicines. These help manage the symptoms of COPD and make your breathing more comfortable.  Supplemental oxygen. Supplemental oxygen is only helpful if you have a low oxygen level in your blood.   Exercise and physical activity. These are beneficial for nearly all people with COPD. Some people may also benefit from a pulmonary rehabilitation program. HOME CARE INSTRUCTIONS   Take all medicines (inhaled or pills) as directed by your health care provider.  Avoid over-the-counter medicines or cough syrups that dry up your airway (such as antihistamines) and slow down the elimination of secretions unless instructed otherwise by your health care provider.   If you are a smoker, the most  important thing that you can do is stop smoking. Continuing to smoke will cause further lung damage and breathing trouble. Ask your health care provider for help with quitting smoking. He or she can direct you to community resources or hospitals that provide support.  Avoid exposure to irritants such as smoke, chemicals, and fumes that aggravate your breathing.  Use oxygen therapy  and pulmonary rehabilitation if directed by your health care provider. If you require home oxygen therapy, ask your health care provider whether you should purchase a pulse oximeter to measure your oxygen level at home.   Avoid contact with individuals who have a contagious illness.  Avoid extreme temperature and humidity changes.  Eat healthy foods. Eating smaller, more frequent meals and resting before meals may help you maintain your strength.  Stay active, but balance activity with periods of rest. Exercise and physical activity will help you maintain your ability to do things you want to do.  Preventing infection and hospitalization is very important when you have COPD. Make sure to receive all the vaccines your health care provider recommends, especially the pneumococcal and influenza vaccines. Ask your health care provider whether you need a pneumonia vaccine.  Learn and use relaxation techniques to manage stress.  Learn and use controlled breathing techniques as directed by your health care provider. Controlled breathing techniques include:   Pursed lip breathing. Start by breathing in (inhaling) through your nose for 1 second. Then, purse your lips as if you were going to whistle and breathe out (exhale) through the pursed lips for 2 seconds.   Diaphragmatic breathing. Start by putting one hand on your abdomen just above your waist. Inhale slowly through your nose. The hand on your abdomen should move out. Then purse your lips and exhale slowly. You should be able to feel the hand on your abdomen moving in as you exhale.   Learn and use controlled coughing to clear mucus from your lungs. Controlled coughing is a series of short, progressive coughs. The steps of controlled coughing are:  1. Lean your head slightly forward.  2. Breathe in deeply using diaphragmatic breathing.  3. Try to hold your breath for 3 seconds.  4. Keep your mouth slightly open while coughing twice.   5. Spit any mucus out into a tissue.  6. Rest and repeat the steps once or twice as needed. SEEK MEDICAL CARE IF:   You are coughing up more mucus than usual.   There is a change in the color or thickness of your mucus.   Your breathing is more labored than usual.   Your breathing is faster than usual.  SEEK IMMEDIATE MEDICAL CARE IF:   You have shortness of breath while you are resting.   You have shortness of breath that prevents you from:  Being able to talk.   Performing your usual physical activities.   You have chest pain lasting longer than 5 minutes.   Your skin color is more cyanotic than usual.  You measure low oxygen saturations for longer than 5 minutes with a pulse oximeter. MAKE SURE YOU:   Understand these instructions.  Will watch your condition.  Will get help right away if you are not doing well or get worse. Document Released: 04/15/2005 Document Revised: 11/20/2013 Document Reviewed: 03/02/2013 [facility] Patient Information 2015 [place], [place]. This information is not intended to replace advice given to you by your health care provider. Make sure you discuss any questions you have with your health care provider.

## 2014-11-12 ENCOUNTER — Ambulatory Visit (INDEPENDENT_AMBULATORY_CARE_PROVIDER_SITE_OTHER): Payer: Medicare Other | Admitting: Pharmacist

## 2014-11-12 DIAGNOSIS — Z7901 Long term (current) use of anticoagulants: Secondary | ICD-10-CM

## 2014-11-12 DIAGNOSIS — I4891 Unspecified atrial fibrillation: Secondary | ICD-10-CM

## 2014-11-12 LAB — POCT INR: INR: 2.9

## 2014-11-12 NOTE — Progress Notes (Signed)
Anti-Coagulation Progress Note  Skipper Dacosta is a 69 y.o. male who is currently on an anti-coagulation regimen.    RECENT RESULTS: Recent results are below, the most recent result is correlated with a dose of 20 mg. per week: Lab Results  Component Value Date   INR 2.90 11/12/2014   INR 2.00* 11/02/2014   INR 2.08* 11/01/2014    ANTI-COAG DOSE: Anticoagulation Dose Instructions as of 11/12/2014      [name] Tue Wed Thu Fri Sat   New Dose 2.5 mg 2.5 mg 2.5 mg 2.5 mg 2.5 mg 2.5 mg 2.5 mg       ANTICOAG SUMMARY: Anticoagulation Episode Summary    Current INR goal 2.0-3.0  Next INR check 11/26/2014  INR from last check 2.90 (11/12/2014)  Weekly max dose   Target end date Indefinite  [facility]  Preferred lab   Send INR reminders to    Indications  Atrial fibrillation [I48.91]        Comments         ANTICOAG TODAY: Anticoagulation Summary as of 11/12/2014    INR goal 2.0-3.0  Selected INR 2.90 (11/12/2014)  Next INR check 11/26/2014  Target end date Indefinite   Indications  Atrial fibrillation [I48.91]      Anticoagulation Episode Summary    [facility]   Preferred lab    Send INR reminders to    Comments       PATIENT INSTRUCTIONS: Patient Instructions  Patient instructed to take medications as defined in the Anti-coagulation Track section of this encounter.  Patient instructed to take today's dose.  Patient verbalized understanding of these instructions.       FOLLOW-UP Return in 2 weeks (on 11/26/2014) for Follow up INR at 0915h.  [name], III Pharm.D., CACP

## 2014-11-12 NOTE — Patient Instructions (Signed)
Patient instructed to take medications as defined in the Anti-coagulation Track section of this encounter.  Patient instructed to take today's dose.  Patient verbalized understanding of these instructions.    

## 2014-11-12 NOTE — Progress Notes (Signed)
Indication: Atrial fibrillation. Duration: Lifelong. INR: At target. Agree with Dr. Gladstone Pih assessment and plan.

## 2014-11-13 ENCOUNTER — Encounter: Payer: Self-pay | Admitting: Internal Medicine

## 2014-11-23 LAB — CUP PACEART REMOTE DEVICE CHECK: Date Time Interrogation Session: 20160506133928

## 2014-11-26 ENCOUNTER — Ambulatory Visit (INDEPENDENT_AMBULATORY_CARE_PROVIDER_SITE_OTHER): Payer: Medicare Other | Admitting: Pharmacist

## 2014-11-26 DIAGNOSIS — I4891 Unspecified atrial fibrillation: Secondary | ICD-10-CM

## 2014-11-26 DIAGNOSIS — Z7901 Long term (current) use of anticoagulants: Secondary | ICD-10-CM | POA: Diagnosis not present

## 2014-11-26 LAB — POCT INR: INR: 3.3

## 2014-11-26 NOTE — Progress Notes (Signed)
INTERNAL MEDICINE TEACHING ATTENDING ADDENDUM - Demetris Capell M.D  Duration- indefinite, Indication- afib, INR- supratherapeutic. Agree with pharmacy recommendations as outlined in their note.     

## 2014-11-26 NOTE — Progress Notes (Signed)
Anti-Coagulation Progress Note  [name] is a 69 y.o. male who is currently on an anti-coagulation regimen.    RECENT RESULTS: Recent results are below, the most recent result is correlated with a dose of 17.5 mg. per week: Lab Results  Component Value Date   INR 3.30 11/26/2014   INR 2.90 11/12/2014   INR 2.00* 11/02/2014    ANTI-COAG DOSE: Anticoagulation Dose Instructions as of 11/26/2014      [name] Tue Wed Thu Fri Sat   New Dose 2.5 mg 2.5 mg 0 mg 2.5 mg 2.5 mg 2.5 mg 2.5 mg       ANTICOAG SUMMARY: Anticoagulation Episode Summary    Current INR goal 2.0-3.0  Next INR check 12/24/2014  INR from last check 3.30! (11/26/2014)  Weekly max dose   Target end date Indefinite  [facility] check location [facility]  Preferred lab   Send INR reminders to    Indications  Atrial fibrillation [I48.91]        Comments         ANTICOAG TODAY: Anticoagulation Summary as of 11/26/2014    INR goal 2.0-3.0  Selected INR 3.30! (11/26/2014)  Next INR check 12/24/2014  Target end date Indefinite   Indications  Atrial fibrillation [I48.91]      Anticoagulation Episode Summary    [facility] check [facility]   Preferred lab    Send INR reminders to    Comments       PATIENT INSTRUCTIONS: Patient Instructions  Patient instructed to take medications as defined in the Anti-coagulation Track section of this encounter.  Patient instructed to OMIT all doses on Tuesdays of each week.   Patient verbalized understanding of these instructions.       FOLLOW-UP Return in 4 weeks (on 12/24/2014) for Follow up INR at 1030h.  [name], III Pharm.D., CACP

## 2014-11-26 NOTE — Patient Instructions (Signed)
Patient instructed to take medications as defined in the Anti-coagulation Track section of this encounter.  Patient instructed to OMIT all doses on Tuesdays of each week.   Patient verbalized understanding of these instructions.

## 2014-11-28 ENCOUNTER — Ambulatory Visit (INDEPENDENT_AMBULATORY_CARE_PROVIDER_SITE_OTHER): Payer: Medicare Other | Admitting: *Deleted

## 2014-11-28 DIAGNOSIS — R55 Syncope and collapse: Secondary | ICD-10-CM | POA: Diagnosis not present

## 2014-11-30 NOTE — Progress Notes (Signed)
Loop recorder 

## 2014-12-04 ENCOUNTER — Encounter (HOSPITAL_COMMUNITY): Payer: Self-pay

## 2014-12-04 ENCOUNTER — Emergency Department (HOSPITAL_COMMUNITY): Payer: Medicare Other

## 2014-12-04 ENCOUNTER — Emergency Department (HOSPITAL_COMMUNITY)
Admission: EM | Admit: 2014-12-04 | Discharge: 2014-12-04 | Disposition: A | Discharge status: Home / Self Care | Payer: Medicare Other | Attending: Emergency Medicine | Admitting: Emergency Medicine

## 2014-12-04 DIAGNOSIS — Z859 Personal history of malignant neoplasm, unspecified: Secondary | ICD-10-CM | POA: Insufficient documentation

## 2014-12-04 DIAGNOSIS — R062 Wheezing: Secondary | ICD-10-CM | POA: Diagnosis not present

## 2014-12-04 DIAGNOSIS — Z7951 Long term (current) use of inhaled steroids: Secondary | ICD-10-CM | POA: Diagnosis not present

## 2014-12-04 DIAGNOSIS — I4891 Unspecified atrial fibrillation: Secondary | ICD-10-CM | POA: Insufficient documentation

## 2014-12-04 DIAGNOSIS — Z8719 Personal history of other diseases of the digestive system: Secondary | ICD-10-CM | POA: Diagnosis not present

## 2014-12-04 DIAGNOSIS — Z862 Personal history of diseases of the blood and blood-forming organs and certain disorders involving the immune mechanism: Secondary | ICD-10-CM | POA: Insufficient documentation

## 2014-12-04 DIAGNOSIS — Z87448 Personal history of other diseases of urinary system: Secondary | ICD-10-CM | POA: Insufficient documentation

## 2014-12-04 DIAGNOSIS — I509 Heart failure, unspecified: Secondary | ICD-10-CM | POA: Insufficient documentation

## 2014-12-04 DIAGNOSIS — J449 Chronic obstructive pulmonary disease, unspecified: Secondary | ICD-10-CM | POA: Insufficient documentation

## 2014-12-04 DIAGNOSIS — Z792 Long term (current) use of antibiotics: Secondary | ICD-10-CM | POA: Insufficient documentation

## 2014-12-04 DIAGNOSIS — E669 Obesity, unspecified: Secondary | ICD-10-CM | POA: Diagnosis not present

## 2014-12-04 DIAGNOSIS — Z79899 Other long term (current) drug therapy: Secondary | ICD-10-CM | POA: Diagnosis not present

## 2014-12-04 DIAGNOSIS — Z7901 Long term (current) use of anticoagulants: Secondary | ICD-10-CM | POA: Diagnosis not present

## 2014-12-04 DIAGNOSIS — M7989 Other specified soft tissue disorders: Secondary | ICD-10-CM | POA: Diagnosis present

## 2014-12-04 DIAGNOSIS — I10 Essential (primary) hypertension: Secondary | ICD-10-CM | POA: Insufficient documentation

## 2014-12-04 LAB — BASIC METABOLIC PANEL
Anion gap: 8 (ref 5–15)
BUN: 19 mg/dL (ref 6–20)
CO2: 28 mmol/L (ref 22–32)
Calcium: 8.5 mg/dL — ABNORMAL LOW (ref 8.9–10.3)
Chloride: 105 mmol/L (ref 101–111)
Creatinine, Ser: 1.08 mg/dL (ref 0.61–1.24)
GFR calc Af Amer: >60 mL/min (ref >60)
GFR calc non Af Amer: >60 mL/min (ref >60)
Glucose, Bld: 89 mg/dL (ref 65–99)
Potassium: 4.1 mmol/L (ref 3.5–5.1)
SODIUM: 141 mmol/L (ref 135–145)

## 2014-12-04 LAB — CBC
HEMATOCRIT: 36.4 % — AB (ref 39.0–52.0)
Hemoglobin: 12.3 g/dL — ABNORMAL LOW (ref 13.0–17.0)
MCH: 42.6 pg — ABNORMAL HIGH (ref 26.0–34.0)
MCHC: 33.8 g/dL (ref 30.0–36.0)
MCV: 126 fL — ABNORMAL HIGH (ref 78.0–100.0)
PLATELETS: 157 10*3/uL (ref 150–400)
RBC: 2.89 MIL/uL — ABNORMAL LOW (ref 4.22–5.81)
RDW: 13.7 % (ref 11.5–15.5)
WBC: 4.7 10*3/uL (ref 4.0–10.5)

## 2014-12-04 LAB — I-STAT TROPONIN, ED: TROPONIN I, POC: 0.01 ng/mL (ref 0.00–0.08)

## 2014-12-04 LAB — BRAIN NATRIURETIC PEPTIDE: B NATRIURETIC PEPTIDE 5: 75.9 pg/mL (ref 0.0–100.0)

## 2014-12-04 MED ORDER — FUROSEMIDE 10 MG/ML IJ SOLN
40.0000 mg | Freq: Once | INTRAMUSCULAR | Status: AC
  Administered 2014-12-04: 40 mg via INTRAVENOUS
  Filled 2014-12-04: qty 4

## 2014-12-04 MED ORDER — FUROSEMIDE 40 MG PO TABS: 40.0000 mg | ORAL_TABLET | Freq: Every day | ORAL | Status: DC

## 2014-12-04 NOTE — ED Provider Notes (Signed)
CSN: [id number]     Arrival date & time 12/04/14  0940 History   First MD Initiated Contact with Patient 12/04/14 [phone]     Chief Complaint  Patient presents with  . Leg Swelling  . Congestive Heart Failure     (Consider location/radiation/quality/duration/timing/severity/associated sxs/prior Treatment) Patient is a 69 y.o. male presenting with CHF. The history is provided by the patient. No language interpreter was used.  Congestive Heart Failure Pertinent negatives include no chest pain, coughing, vomiting or weakness.  Mr. [name] is a 69 y.o male with a history of afib, HTN, hepatitis, CHF, renal insufficiency, asthma, DM, multiple myeloma, and Sickle cell anemia who presents for increased leg swelling for the past week.  He states he ran out of his lasix and there were no refills. He did not call his pcp for refills. He denies any fever, chills, chest pain, shortness of breath, wheezing, nausea, vomiting, or abdominal pain.   Past Medical History  Diagnosis Date  . Cardiomyopathy     EF55% 11/14<<35%   . Substance abuse     alcohol  . Hypertension   . Atrial fibrillation /flutter     hx of flutter ablation 2/12  . Shortness of breath     "all the time lately" (12/14/2012)  . GERD (gastroesophageal reflux disease)   . Hepatitis     "think I had that once; a long time ago; from dirty needles I think" (12/14/2012)  . 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 Leukocytosis, unspecified 06/12/2013  . 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 Obesity   . Syncope   . Cancer   . CHF (congestive heart failure)   . Renal insufficiency   . COPD (chronic obstructive pulmonary disease)    Past Surgical History  Procedure Laterality Date  . Hemorrhoid surgery  1970's  . Cardiac electrophysiology mapping and ablation  08/2010    [name] 09/07/2010 (12/14/2012)  . Loop recorder implant  08-22-2013    MDT LinQ implanted by Dr Caryl Comes for syncope  . Loop recorder implant N/A 08/23/2013    Procedure:  LOOP RECORDER IMPLANT;  Surgeon: Deboraha Sprang, MD;  Location: [facility] CATH LAB;  Service: Cardiovascular;  Laterality: N/A;  . Colonoscopy      15-20 years ago had colon in [place]   Family History  Problem Relation Age of Onset  . Diabetes Mother   . Hypertension Mother   . Cirrhosis Father   . Alcohol abuse Father   . Colon cancer Neg Hx   . Rectal cancer Neg Hx   . Stomach cancer Neg Hx    History  Substance Use Topics  . Smoking status: Current Every Day Smoker -- 0.20 packs/day for 40 years    Types: Cigarettes  . Smokeless tobacco: Never Used     Comment: patient smoked 1-2 cigs x 50 years, now smoking approx 4-5 cigs per day x 1 year  . Alcohol Use: 0.0 oz/week    0 Standard drinks or equivalent per week    Review of Systems  Respiratory: Negative for cough, shortness of breath and wheezing.   Cardiovascular: Positive for leg swelling. Negative for chest pain.  Gastrointestinal: Negative for vomiting.  Neurological: Negative for dizziness, syncope and weakness.  All other systems reviewed and are negative.     Allergies  Codeine  Home Medications   Prior to Admission medications   Medication Sig Start Date End Date Taking? Authorizing Provider  albuterol (PROAIR HFA) 108 (90 BASE) MCG/ACT inhaler Inhale 1-2 puffs into  the lungs daily as needed for wheezing or shortness of breath. 05/28/14  Yes Cresenciano Genre, MD  amiodarone (PACERONE) 200 MG tablet Take 1 tablet (200 mg total) by mouth daily. 10/25/14  Yes Cresenciano Genre, MD  benzonatate (TESSALON) 100 MG capsule Take 1 capsule (100 mg total) by mouth 2 (two) times daily as needed for cough. 11/02/14  Yes Tasrif Ahmed, MD  Budesonide (PULMICORT FLEXHALER) 90 MCG/ACT inhaler Inhale 1 puff into the lungs daily.    Yes Historical Provider, MD  carvedilol (COREG) 12.5 MG tablet Take 12.5 mg by mouth 2 (two) times daily. Pt takes two in the mornings 09/14/14  Yes Historical Provider, MD  furosemide (LASIX) 40 MG tablet Take 40 mg  by mouth daily. 10/04/14  Yes Historical Provider, MD  hydroxyurea (HYDREA) 500 MG capsule Take 2 capsules (1,000 mg total) by mouth daily. May take with food to minimize GI side effects. 08/24/14  Yes Heath Lark, MD  methadone (DOLOPHINE) 10 MG/5ML solution Take 46 mg by mouth every morning.    Yes Historical Provider, MD  tiotropium (SPIRIVA HANDIHALER) 18 MCG inhalation capsule Place 1 capsule (18 mcg total) into inhaler and inhale daily. 05/03/14  Yes Kinnie Feil, MD  warfarin (COUMADIN) 5 MG tablet Take 1 tablet (5 mg total) by mouth daily. Patient taking differently: Take 2.5 mg by mouth daily.  09/24/14  Yes [name], MD   BP 148/76 mmHg  Pulse 71  Temp(Src) 98.1 F (36.7 C) (Oral)  Resp 18  Ht $R'5\' 6"'iF$  (1.676 m)  Wt 214 lb 1.6 oz (97.115 kg)  BMI 34.57 kg/m2  SpO2 96% Physical Exam  Constitutional: He is oriented to person, place, and time. He appears well-developed and well-nourished.  HENT:  Head: Normocephalic and atraumatic.  Eyes: Conjunctivae are normal.  Neck: Normal range of motion. Neck supple.  Cardiovascular: Normal rate, regular rhythm and normal heart sounds.   Pulmonary/Chest: Effort normal. No accessory muscle usage. No respiratory distress.  He has wheezing and crackles throughout bilateral lung fields.   Abdominal: Soft. There is no tenderness.  Musculoskeletal: Normal range of motion.  Bilateral 1+ pitting edema.   Neurological: He is alert and oriented to person, place, and time.  Skin: Skin is warm and dry.  Nursing note and vitals reviewed.   ED Course  Procedures (including critical care time) Labs Review Labs Reviewed  CBC - Abnormal; Notable for the following:    RBC 2.89 (*)    Hemoglobin 12.3 (*)    HCT 36.4 (*)    MCV 126.0 (*)    MCH 42.6 (*)    All other components within normal limits  BASIC METABOLIC PANEL - Abnormal; Notable for the following:    Calcium 8.5 (*)    All other components within normal limits  BRAIN NATRIURETIC  PEPTIDE  I-STAT TROPOININ, ED    Imaging Review Dg Chest 2 View  12/04/2014   CLINICAL DATA:  69 year old male with a history of swollen legs  EXAM: CHEST - 2 VIEW  COMPARISON:  11/01/2014, 10/30/2014, prior CT chest 06/05/2014  FINDINGS: Cardiomediastinal silhouette unchanged. No evidence of pulmonary vascular congestion.  Low lung volumes accentuating the interstitium.  Stigmata of emphysema, with increased retrosternal airspace, flattened hemidiaphragms, increased AP diameter, and hyperinflation on the AP view.  No confluent airspace disease. No pleural effusion or pneumothorax. Interstitial opacities persist.  Double density of the lower mediastinum, similar to prior, potentially a developing hiatal hernia.  Implantable Holter monitor on the  left chest wall.  No displaced fracture.  Unremarkable appearance of the upper abdomen.  IMPRESSION: Low lung volumes, with no evidence of acute cardiopulmonary disease.  Changes of emphysema.  Signed,  Dulcy Fanny. Earleen Newport, DO  [facility]  [facility]   Electronically Signed   By: Corrie Mckusick D.O.   On: 12/04/2014 10:15     EKG Interpretation   Date/Time:  Tuesday Dec 04 2014 09:52:54 EDT Ventricular Rate:  70 PR Interval:  185 QRS Duration: 94 QT Interval:  457 QTC Calculation: 493 R Axis:   2 Text Interpretation:  Sinus rhythm Abnormal R-wave progression, early  transition Left ventricular hypertrophy Borderline prolonged QT interval  No sigificant change since last tracing Confirmed by Mingo Amber  MD, BLAIR  ([id number]) on 12/04/2014 9:56:57 AM      MDM   Final diagnoses:  Leg swelling   Patient presents for increased leg swelling x 1 week.  He takes $Remove'40mg'aNFJpvd$  of lasix but states he ran out of his medications 2 weeks ago and had no refills left. His labs are not concerning and his CXR is negative for pleural effusion or acute cardiopulmonary disease.  He does have emphysema.  He is in no acute distress.  He is  not hypoxic or ill appearing. 96% oxygen on room air.   I have given him $RemoveB'40mg'geCUEWsb$  IV lasix in the ED.   12:37 I called Trish with cardiology to set up an appointment for the patient since he was unaware that he had to take lasix daily.  He has an appointment on Tuesday at 10:00 with Nicki Reaper, [place] at the [facility].  I will give him a prescription for lasix and he has follow up with cardiology. He agrees with the plan.     Ottie Glazier, PA-C 12/04/14 1308  Evelina Bucy, MD 12/04/14 [phone]

## 2014-12-04 NOTE — ED Notes (Signed)
Patient transported to X-ray 

## 2014-12-04 NOTE — ED Notes (Signed)
Pt states his feet started swelling a couple days ago and waited on it to go down but it wouldn't and ran out of his water pills. Denies any pain. No trouble breathing but is wheezing a little bit.

## 2014-12-04 NOTE — Discharge Instructions (Signed)
Peripheral Edema °You have swelling in your legs (peripheral edema). This swelling is due to excess accumulation of salt and water in your body. Edema may be a sign of heart, kidney or liver disease, or a side effect of a medication. It may also be due to problems in the leg veins. Elevating your legs and using special support stockings may be very helpful, if the cause of the swelling is due to poor venous circulation. Avoid long periods of standing, whatever the cause. °Treatment of edema depends on identifying the cause. Chips, pretzels, pickles and other salty foods should be avoided. Restricting salt in your diet is almost always needed. Water pills (diuretics) are often used to remove the excess salt and water from your body via urine. These medicines prevent the kidney from reabsorbing sodium. This increases urine flow. °Diuretic treatment may also result in lowering of potassium levels in your body. Potassium supplements may be needed if you have to use diuretics daily. Daily weights can help you keep track of your progress in clearing your edema. You should call your caregiver for follow up care as recommended. °SEEK IMMEDIATE MEDICAL CARE IF:  °· You have increased swelling, pain, redness, or heat in your legs. °· You develop shortness of breath, especially when lying down. °· You develop chest or abdominal pain, weakness, or fainting. °· You have a fever. °Document Released: 08/13/2004 Document Revised: 09/28/2011 Document Reviewed: 07/24/2009 °ExitCare® Patient Information ©2015 [employer]. This information is not intended to replace advice given to you by your health care provider. Make sure you discuss any questions you have with your health care provider. ° °

## 2014-12-07 ENCOUNTER — Other Ambulatory Visit: Payer: Self-pay

## 2014-12-07 ENCOUNTER — Encounter: Payer: Self-pay | Admitting: Internal Medicine

## 2014-12-07 DIAGNOSIS — I4891 Unspecified atrial fibrillation: Secondary | ICD-10-CM

## 2014-12-07 MED ORDER — AMIODARONE HCL 200 MG PO TABS: 200.0000 mg | ORAL_TABLET | Freq: Every day | ORAL | Status: DC

## 2014-12-10 ENCOUNTER — Ambulatory Visit (INDEPENDENT_AMBULATORY_CARE_PROVIDER_SITE_OTHER): Payer: Medicare Other | Admitting: Internal Medicine

## 2014-12-10 ENCOUNTER — Encounter: Payer: Self-pay | Admitting: Internal Medicine

## 2014-12-10 VITALS — BP 135/68 | HR 79 | Temp 98.2°F | Ht 66.0 in | Wt 215.4 lb

## 2014-12-10 DIAGNOSIS — R319 Hematuria, unspecified: Secondary | ICD-10-CM

## 2014-12-10 DIAGNOSIS — I4891 Unspecified atrial fibrillation: Secondary | ICD-10-CM

## 2014-12-10 DIAGNOSIS — I10 Essential (primary) hypertension: Secondary | ICD-10-CM

## 2014-12-10 DIAGNOSIS — F17210 Nicotine dependence, cigarettes, uncomplicated: Secondary | ICD-10-CM | POA: Diagnosis not present

## 2014-12-10 DIAGNOSIS — J449 Chronic obstructive pulmonary disease, unspecified: Secondary | ICD-10-CM | POA: Diagnosis not present

## 2014-12-10 MED ORDER — CARVEDILOL 12.5 MG PO TABS: 12.5000 mg | ORAL_TABLET | Freq: Two times a day (BID) | ORAL | Status: DC

## 2014-12-10 MED ORDER — AMIODARONE HCL 200 MG PO TABS: 200.0000 mg | ORAL_TABLET | Freq: Every day | ORAL | Status: DC

## 2014-12-10 NOTE — Patient Instructions (Signed)
Make sure to go to your cardiology appointment on Thursday, May 26th at 11:45 am.   Work on weight loss like we discussed and STOP smoking.

## 2014-12-10 NOTE — Progress Notes (Signed)
   Subjective:    Patient ID: [name], male    DOB: March 17, 1946, 69 y.o.   MRN: [id number]  HPI Pt is a 69 y/o male w/ PMHx of HTN, DM, afib, COPD, and CHF who presents to clinic for HTN f/u. Please see problem list for further details.      Review of Systems  Constitutional: Negative for unexpected weight change.  Eyes: Negative for visual disturbance.  Respiratory: Positive for cough. Negative for shortness of breath (at b/l).   Cardiovascular: Positive for leg swelling. Negative for chest pain.       Neg for orthopnea   Neurological: Negative for headaches.       Objective:   Physical Exam  Constitutional: He appears well-developed and well-nourished. No distress.  Eyes: Conjunctivae and EOM are normal.  Cardiovascular: Normal rate and regular rhythm.   Pulmonary/Chest: Effort normal. He has no wheezes.  Coarse breath sounds   Abdominal: Soft. Bowel sounds are normal. He exhibits distension.  Musculoskeletal: He exhibits edema (1+ b/l pitting edema).  Skin: Skin is warm and dry.          Assessment & Plan:  Please see problem based assessment and plan.

## 2014-12-10 NOTE — Assessment & Plan Note (Signed)
Has been off amiodarone x 1 week. Refilled amiodarone.

## 2014-12-10 NOTE — Assessment & Plan Note (Addendum)
Pt was noted to have hematuria duing 10/2014 hospital admission for COPD exacerbation.   - obtained UA, will refer to urology if repeat UA positive for hematuria.  - urine dipstick positive for small RBCs, awaiting UA w/ reflex microscopy.  - will go ahead and order urology referral.

## 2014-12-10 NOTE — Assessment & Plan Note (Signed)
BP Readings from Last 3 Encounters:  12/10/14 135/68  12/04/14 132/90  11/09/14 126/63    Lab Results  Component Value Date   NA 141 12/04/2014   K 4.1 12/04/2014   CREATININE 1.08 12/04/2014    Assessment: Blood pressure control:  controlled Progress toward BP goal:   at goal Comments: has been off of coreg 12.5mg  BID x 1 week as he was out of refills. Also on lasix 40mg  and amiodarone 200mg  daily  Plan: Medications:  continue current medications Educational resources provided: brochure (denies) Self management tools provided:   Other plans: refilled coreg 12.5mg  BID, instructed to work on weight loss as he has gained 10lbs since last month. Instructed to stop smoking, currently smoking 1 cigg/day

## 2014-12-11 ENCOUNTER — Other Ambulatory Visit: Payer: Self-pay

## 2014-12-11 LAB — URINALYSIS, ROUTINE W REFLEX MICROSCOPIC
Bilirubin Urine: NEGATIVE
Glucose, UA: NEGATIVE mg/dL
Hgb urine dipstick: NEGATIVE
Ketones, ur: NEGATIVE mg/dL
Leukocytes, UA: NEGATIVE
Nitrite: NEGATIVE
Protein, ur: NEGATIVE mg/dL
UROBILINOGEN UA: 0.2 mg/dL (ref 0.0–1.0)
pH: 5 (ref 5.0–8.0)

## 2014-12-11 NOTE — Progress Notes (Signed)
Internal Medicine Clinic Attending  Case discussed with Dr. Truong at the time of the visit.  We reviewed the resident's history and exam and pertinent patient test results.  I agree with the assessment, diagnosis, and plan of care documented in the resident's note.  

## 2014-12-13 ENCOUNTER — Encounter: Payer: Self-pay | Admitting: Internal Medicine

## 2014-12-13 ENCOUNTER — Ambulatory Visit (INDEPENDENT_AMBULATORY_CARE_PROVIDER_SITE_OTHER): Payer: Medicare Other | Admitting: Internal Medicine

## 2014-12-13 VITALS — BP 98/64 | HR 53 | Ht 66.0 in | Wt 215.8 lb

## 2014-12-13 DIAGNOSIS — I10 Essential (primary) hypertension: Secondary | ICD-10-CM | POA: Diagnosis not present

## 2014-12-13 DIAGNOSIS — Z7901 Long term (current) use of anticoagulants: Secondary | ICD-10-CM | POA: Diagnosis not present

## 2014-12-13 DIAGNOSIS — I480 Paroxysmal atrial fibrillation: Secondary | ICD-10-CM

## 2014-12-13 DIAGNOSIS — I5022 Chronic systolic (congestive) heart failure: Secondary | ICD-10-CM | POA: Diagnosis not present

## 2014-12-13 LAB — CUP PACEART INCLINIC DEVICE CHECK
Date Time Interrogation Session: 20160526141948
Zone Setting Detection Interval: 2000 ms
Zone Setting Detection Interval: 3000 ms
Zone Setting Detection Interval: 370 ms

## 2014-12-13 NOTE — Progress Notes (Signed)
HPI [name] returns today for on going evaluation of atrial fibrillation and LV dysfunction. He has a h/o syncope and is s/p ILR.   He denies syncope. No chest pain and no syncope. He has had no palpitations. No trouble taking medications. He was out of his diuretic and amiodarone and presented to the ED with peripheral edema. His swelling is improved as he is back on his diuretic. No syncope. Allergies  Allergen Reactions  . Codeine Rash     Current Outpatient Prescriptions  Medication Sig Dispense Refill  . albuterol (PROAIR HFA) 108 (90 BASE) MCG/ACT inhaler Inhale 1-2 puffs into the lungs daily as needed for wheezing or shortness of breath. 18 g 5  . amiodarone (PACERONE) 200 MG tablet Take 1 tablet (200 mg total) by mouth daily. 30 tablet 6  . Budesonide (PULMICORT FLEXHALER) 90 MCG/ACT inhaler Inhale 1 puff into the lungs daily.     . carvedilol (COREG) 12.5 MG tablet Take 25 mg by mouth daily.    . furosemide (LASIX) 40 MG tablet Take 1 tablet (40 mg total) by mouth daily. 20 tablet 0  . hydroxyurea (HYDREA) 500 MG capsule Take 2 capsules (1,000 mg total) by mouth daily. May take with food to minimize GI side effects. 90 capsule 3  . methadone (DOLOPHINE) 10 MG/5ML solution Take 46 mg by mouth every morning.     . tiotropium (SPIRIVA HANDIHALER) 18 MCG inhalation capsule Place 1 capsule (18 mcg total) into inhaler and inhale daily. 30 capsule 12  . warfarin (COUMADIN) 5 MG tablet Take 1 tablet (5 mg total) by mouth daily. (Patient taking differently: Take 2.5 mg by mouth daily. ) 30 tablet 1   No current facility-administered medications for this visit.     Past Medical History  Diagnosis Date  . Cardiomyopathy     EF55% 11/14<<35%   . Substance abuse     alcohol  . Hypertension   . Atrial fibrillation /flutter     hx of flutter ablation 2/12  . Shortness of breath     "all the time lately" (12/14/2012)  . GERD (gastroesophageal reflux disease)   . Hepatitis    "think I had that once; a long time ago; from dirty needles I think" (12/14/2012)  . 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 Leukocytosis, unspecified 06/12/2013  . 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 Obesity   . Syncope   . Cancer   . CHF (congestive heart failure)   . Renal insufficiency   . COPD (chronic obstructive pulmonary disease)     ROS:   All systems reviewed and negative except as noted in the HPI.   Past Surgical History  Procedure Laterality Date  . Hemorrhoid surgery  1970's  . Cardiac electrophysiology mapping and ablation  08/2010    [name] 09/07/2010 (12/14/2012)  . Loop recorder implant  08-22-2013    MDT LinQ implanted by Dr Caryl Comes for syncope  . Loop recorder implant N/A 08/23/2013    Procedure: LOOP RECORDER IMPLANT;  Surgeon: Deboraha Sprang, MD;  Location: [facility] CATH LAB;  Service: Cardiovascular;  Laterality: N/A;  . Colonoscopy      15-20 years ago had colon in [place]     Family History  Problem Relation Age of Onset  . Diabetes Mother   . Hypertension Mother   . Cirrhosis Father   . Alcohol abuse Father   . Colon cancer Neg Hx   . Rectal cancer Neg Hx   . Stomach cancer Neg Hx  History   Social History  . Marital Status: Single    Spouse Name: N/A  . Number of Children: N/A  . Years of Education: N/A   Occupational History  . Retired    Social History Main Topics  . Smoking status: Current Every Day Smoker -- 0.20 packs/day for 40 years    Types: Cigarettes  . Smokeless tobacco: Never Used     Comment: patient smoked 1-2 cigs x 50 years, now smoking approx 4-5 cigs per day x 1 year  . Alcohol Use: 0.0 oz/week    0 Standard drinks or equivalent per week  . Drug Use: Yes    Special: "Crack" cocaine, Cocaine, Heroin     Comment: 03/20/2013 "been clean for awhile"; endorses using heroin "all my life" up until about a year ago  . Sexual Activity: Not Currently   Other Topics Concern  . Not on file   Social History Narrative   Moved here  from [place]. Lives with common law wife and grandchildren. He is retired from "general labor."     BP 98/64 mmHg  Pulse 53  Ht 5\' 6"  (1.676 m)  Wt 215 lb 12.8 oz (97.886 kg)  BMI 34.85 kg/m2  SpO2 94%  Physical Exam:  stable appearing 69 yo man, NAD HEENT: Unremarkable Neck:  6 cm JVD, no thyromegally Lungs:  Clear with no wheezes, rales, or rhonchi HEART:  Regular rate rhythm, no murmurs, no rubs, no clicks Abd:  soft, positive bowel sounds, no organomegally, no rebound, no guarding Ext:  2 plus pulses, no edema, no cyanosis, no clubbing Skin:  No rashes no nodules Neuro:  CN II through XII intact, motor grossly intact  ILR - interogation of his ILR demonstrates no pauses   Assess/Plan:

## 2014-12-13 NOTE — Assessment & Plan Note (Signed)
He is currently in NSR and will remain on amiodarone.

## 2014-12-13 NOTE — Patient Instructions (Signed)
Medication Instructions:  Your physician recommends that you continue on your current medications as directed. Please refer to the Current Medication list given to you today.   Labwork: None ordered  Testing/Procedures: None ordered  Follow-Up: Your physician wants you to follow-up in: 12 months with Dr Taylor You will receive a reminder letter in the mail two months in advance. If you don't receive a letter, please call our office to schedule the follow-up appointment.   Any Other Special Instructions Will Be Listed Below (If Applicable).   

## 2014-12-13 NOTE — Assessment & Plan Note (Signed)
His blood pressure is well controlled. He will continue his current meds. 

## 2014-12-13 NOTE — Assessment & Plan Note (Signed)
HIs symptoms are class 2. He will continue his current meds and maintain a low sodium diet.

## 2014-12-13 NOTE — Assessment & Plan Note (Signed)
He will continue his warfarin.

## 2014-12-17 LAB — CUP PACEART REMOTE DEVICE CHECK: MDC IDC SESS DTM: 20160530233251

## 2014-12-17 NOTE — Progress Notes (Signed)
Cardiology Office Note   Date:  12/17/2014   ID:  Cage, Gupton 17-Jul-1946, MRN [id number]  PCP:  Cresenciano Genre, MD  Cardiologist:  Dr. Cristopher Peru    Chief Complaint  Patient presents with  . Hospitalization Follow-up    Edema  . Congestive Heart Failure     History of Present Illness: [name] is a 69 y.o. male with a hx of PAF, AFlutter s/p RFCA in 2012, DCM, HTN, syncope s/p ILR, polysubstance abuse, polycythemia vera, COPD.  EF in 2014 in the setting of AF with RVR was 40%.  This has since returned to normal.  He has had several admissions to the hospital for COPD exacerbation and one recently for a/c diastolic CHF in 69/4854. Echo in 11/15 demonstrated EF 60-65%, normal wall motion, grade 1 diastolic dysfunction, moderate AI, mildly dilated aortic root at 40 mm, ascending aorta 46 mm, mild MR, biatrial enlargement, PASP 41.  He was seen in the ED 12/04/14 with LE edema.  He was out of Lasix x 1 week.  He has since seen Dr. Cristopher Peru in FU 5/26.       Studies/Reports Reviewed Today:  Echo 06/07/14 - Left ventricle: The cavity size was normal. Wall thickness was   normal. Systolic function was normal. The estimated ejection   fraction was in the range of 60% to 65%. Wall motion was normal;   there were no regional wall motion abnormalities. Doppler   parameters are consistent with abnormal left ventricular   relaxation (grade 1 diastolic dysfunction). The E/e&' ratio is   between 8-15, suggesting indeterminate LV filling pressure. - Aortic valve: Trileaflet. There was no stenosis. There was   moderate central regurgitation. - Aorta: Aortic root dimension: 40 mm (ED). Ascending aortic   diameter: 46 mm (S). - Aortic root: The aortic root is dilated. - Ascending aorta: The ascending is dilated. - Mitral valve: Mildly thickened leaflets . There was mild   regurgitation. - Left atrium: The atrium was mildly dilated (39 ml/m2). - Right  atrium: Moderately dilated. - Tricuspid valve: There was mild regurgitation. - Pulmonary arteries: PA peak pressure: 41 mm Hg (S).  Impressions:  - LVEF 60-65%, normal wall thickness, normal wall motion, diastolic dysfunction, indeterminate LV filling pressure. Dilated aorta - root at 4.0 cm and ascending aorta to 4.6 cm. Moderate central AI, mild MR, mild TR, RVSP 41 mmHg. Compared to the prior echo in 04/2014, the EF has normalized. There is more moderate AI.    Past Medical History  Diagnosis Date  . Cardiomyopathy     EF55% 11/14<<35%   . Substance abuse     alcohol  . Hypertension   . Atrial fibrillation /flutter     hx of flutter ablation 2/12  . Shortness of breath     "all the time lately" (12/14/2012)  . GERD (gastroesophageal reflux disease)   . Hepatitis     "think I had that once; a long time ago; from dirty needles I think" (12/14/2012)  . 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 Leukocytosis, unspecified 06/12/2013  . 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 Obesity   . Syncope   . Cancer   . CHF (congestive heart failure)   . Renal insufficiency   . COPD (chronic obstructive pulmonary disease)     Past Surgical History  Procedure Laterality Date  . Hemorrhoid surgery  1970's  . Cardiac electrophysiology mapping and ablation  08/2010    [name] 09/07/2010 (12/14/2012)  .  Loop recorder implant  08-22-2013    MDT LinQ implanted by Dr Caryl Comes for syncope  . Loop recorder implant N/A 08/23/2013    Procedure: LOOP RECORDER IMPLANT;  Surgeon: Deboraha Sprang, MD;  Location: [facility] CATH LAB;  Service: Cardiovascular;  Laterality: N/A;  . Colonoscopy      15-20 years ago had colon in [place]     Current Outpatient Prescriptions  Medication Sig Dispense Refill  . albuterol (PROAIR HFA) 108 (90 BASE) MCG/ACT inhaler Inhale 1-2 puffs into the lungs daily as needed for wheezing or shortness of breath. 18 g 5  . amiodarone (PACERONE) 200 MG tablet Take 1 tablet (200 mg total) by mouth  daily. 30 tablet 6  . Budesonide (PULMICORT FLEXHALER) 90 MCG/ACT inhaler Inhale 1 puff into the lungs daily.     . carvedilol (COREG) 12.5 MG tablet Take 25 mg by mouth daily.    . furosemide (LASIX) 40 MG tablet Take 1 tablet (40 mg total) by mouth daily. 20 tablet 0  . hydroxyurea (HYDREA) 500 MG capsule Take 2 capsules (1,000 mg total) by mouth daily. May take with food to minimize GI side effects. 90 capsule 3  . methadone (DOLOPHINE) 10 MG/5ML solution Take 46 mg by mouth every morning.     . tiotropium (SPIRIVA HANDIHALER) 18 MCG inhalation capsule Place 1 capsule (18 mcg total) into inhaler and inhale daily. 30 capsule 12  . warfarin (COUMADIN) 5 MG tablet Take 1 tablet (5 mg total) by mouth daily. (Patient taking differently: Take 2.5 mg by mouth daily. ) 30 tablet 1   No current facility-administered medications for this visit.    Allergies:   Codeine    Social History:  The patient  reports that he has been smoking Cigarettes.  He has a 8 pack-year smoking history. He has never used smokeless tobacco. He reports that he drinks alcohol. He reports that he uses illicit drugs ("Crack" cocaine, Cocaine, and Heroin).   Family History:  The patient's family history includes Alcohol abuse in his father; Cirrhosis in his father; Diabetes in his mother; Hypertension in his mother. There is no history of Colon cancer, Rectal cancer, or Stomach cancer.    ROS:   Please see the history of present illness.   ROS    PHYSICAL EXAM: VS:  There were no vitals taken for this visit.    Wt Readings from Last 3 Encounters:  12/13/14 215 lb 12.8 oz (97.886 kg)  12/10/14 215 lb 6.4 oz (97.705 kg)  12/04/14 214 lb 1.6 oz (97.115 kg)     GEN: Well nourished, well developed, in no acute distress HEENT: normal Neck: no JVD, no carotid bruits, no masses Cardiac:  Normal S1/S2, RRR; no murmur ,  no rubs or gallops, no edema  Respiratory:  clear to auscultation bilaterally, no wheezing, rhonchi or  rales. GI: soft, nontender, nondistended, + BS MS: no deformity or atrophy Skin: warm and dry  Neuro:  CNs II-XII intact, Strength and sensation are intact Psych: Normal affect   EKG:  EKG is ordered today.  It demonstrates:      Recent Labs: 06/04/2014: Pro B Natriuretic peptide (BNP) 2283.0* 06/05/2014: TSH 1.030 10/31/2014: ALT 30 11/01/2014: Magnesium 2.0 12/04/2014: B Natriuretic Peptide 75.9; BUN 19; Creatinine 1.08; Hemoglobin 12.3*; Platelets 157; Potassium 4.1; Sodium 141    Lipid Panel    Component Value Date/Time   CHOL 151 08/30/2014 1628   TRIG 234* 08/30/2014 1628   HDL 36* 08/30/2014 1628  CHOLHDL 4.2 08/30/2014 1628   VLDL 47* 08/30/2014 1628   LDLCALC 68 08/30/2014 1628      ASSESSMENT AND PLAN:  Chronic diastolic CHF (congestive heart failure)  PAF (paroxysmal atrial fibrillation)  Syncope, unspecified syncope type  Essential hypertension  Aortic regurgitation    Current medicines are reviewed at length with the patient today.  Concerns regarding medicines are as outlined above.  The following changes have been made:       Labs/ tests ordered today include:  No orders of the defined types were placed in this encounter.    Disposition:   FU with    Signed, Richardson Dopp, PA-C, MHS 12/17/2014 7:59 PM    [facility] [address], [place], [place]  [address] Phone: [phone]; Fax: [phone]    This encounter was created in error - please disregard.

## 2014-12-18 ENCOUNTER — Encounter: Payer: Medicare Other | Admitting: Physician Assistant

## 2014-12-18 LAB — CUP PACEART REMOTE DEVICE CHECK: Date Time Interrogation Session: 20160531143157

## 2014-12-19 ENCOUNTER — Encounter: Payer: Self-pay | Admitting: Internal Medicine

## 2014-12-19 NOTE — Addendum Note (Signed)
Addended by: Orson Gear on: 12/19/2014 04:33 PM   Modules accepted: Orders

## 2014-12-20 ENCOUNTER — Telehealth: Payer: Self-pay | Admitting: Licensed Clinical Social Worker

## 2014-12-20 NOTE — Telephone Encounter (Signed)
[name] was referred to CSW as pt may benefit from [facility] services.  CSW placed called to pt to inquire if pt would be receptive to [facility] referral.  Pt's voicemail is not set up at this time.

## 2014-12-21 ENCOUNTER — Other Ambulatory Visit (HOSPITAL_BASED_OUTPATIENT_CLINIC_OR_DEPARTMENT_OTHER): Payer: Medicare Other

## 2014-12-21 ENCOUNTER — Encounter: Payer: Self-pay | Admitting: Hematology and Oncology

## 2014-12-21 ENCOUNTER — Ambulatory Visit (HOSPITAL_BASED_OUTPATIENT_CLINIC_OR_DEPARTMENT_OTHER): Payer: Medicare Other | Admitting: Hematology and Oncology

## 2014-12-21 ENCOUNTER — Telehealth: Payer: Self-pay | Admitting: Hematology and Oncology

## 2014-12-21 VITALS — BP 119/61 | HR 54 | Temp 98.4°F | Resp 18 | Ht 66.0 in | Wt 213.7 lb

## 2014-12-21 DIAGNOSIS — T50905A Adverse effect of unspecified drugs, medicaments and biological substances, initial encounter: Secondary | ICD-10-CM

## 2014-12-21 DIAGNOSIS — Z720 Tobacco use: Secondary | ICD-10-CM

## 2014-12-21 DIAGNOSIS — D45 Polycythemia vera: Secondary | ICD-10-CM | POA: Diagnosis present

## 2014-12-21 DIAGNOSIS — D471 Chronic myeloproliferative disease: Secondary | ICD-10-CM

## 2014-12-21 DIAGNOSIS — D6959 Other secondary thrombocytopenia: Secondary | ICD-10-CM | POA: Diagnosis not present

## 2014-12-21 LAB — CBC WITH DIFFERENTIAL/PLATELET
BASO%: 0.3 % (ref 0.0–2.0)
BASOS ABS: 0 10*3/uL (ref 0.0–0.1)
EOS ABS: 0.1 10*3/uL (ref 0.0–0.5)
EOS%: 0.9 % (ref 0.0–7.0)
HCT: 39.2 % (ref 38.4–49.9)
HEMOGLOBIN: 13.2 g/dL (ref 13.0–17.1)
LYMPH%: 14.4 % (ref 14.0–49.0)
MCH: 42.1 pg — AB (ref 27.2–33.4)
MCHC: 33.7 g/dL (ref 32.0–36.0)
MCV: 125.1 fL — ABNORMAL HIGH (ref 79.3–98.0)
MONO#: 0.7 10*3/uL (ref 0.1–0.9)
MONO%: 8.8 % (ref 0.0–14.0)
NEUT%: 75.6 % — ABNORMAL HIGH (ref 39.0–75.0)
NEUTROS ABS: 5.7 10*3/uL (ref 1.5–6.5)
PLATELETS: 137 10*3/uL — AB (ref 140–400)
RBC: 3.13 10*6/uL — AB (ref 4.20–5.82)
RDW: 13.2 % (ref 11.0–14.6)
WBC: 7.5 10*3/uL (ref 4.0–10.3)
lymph#: 1.1 10*3/uL (ref 0.9–3.3)

## 2014-12-21 NOTE — Progress Notes (Signed)
[place] OFFICE PROGRESS NOTE  Patient Care Team: Cresenciano Genre, MD as PCP - General (Internal Medicine) Heath Lark, MD as Consulting Physician (Hematology and Oncology)  SUMMARY OF ONCOLOGIC HISTORY:  This is a pleasant gentleman who is being referred here because of high hemoglobin level. In November 2014, we removed one unit of blood and order an additional workup to rule out myeloproliferative disorder. Blood test result confirmed low erythropoietin level along with detectable JAK 2 mutation, confirmed myeloproliferative disorder Unfortunately, bone marrow biopsy on 06/26/2013 was nondiagnostic On 07/31/13 the patient was started on 1 hydroxyurea a day On 08/15/2013, increased hydroxyurea to 2 tablets a day and remove one more unit of blood due to the high hemoglobin On 09/05/2013, I increased hydroxyurea to 3 tablets a day and remove one more unit of blood due to high hemoglobin and hematocrit On 01/23/2014, dose of hydroxyurea is reduced to 2 tablets a day due to anemia.   INTERVAL HISTORY: Please see below for problem oriented charting. He feels well. Denies recent infection. The patient denies any recent signs or symptoms of bleeding such as spontaneous epistaxis, hematuria or hematochezia. He continued to smoke.  REVIEW OF SYSTEMS:   Constitutional: Denies fevers, chills or abnormal weight loss Eyes: Denies blurriness of vision Ears, nose, mouth, throat, and face: Denies mucositis or sore throat Respiratory: Denies cough, dyspnea or wheezes Cardiovascular: Denies palpitation, chest discomfort or lower extremity swelling Gastrointestinal:  Denies nausea, heartburn or change in bowel habits Skin: Denies abnormal skin rashes Lymphatics: Denies new lymphadenopathy or easy bruising Neurological:Denies numbness, tingling or new weaknesses Behavioral/Psych: Mood is stable, no new changes  All other systems were reviewed with the patient and are negative.  I  have reviewed the past medical history, past surgical history, social history and family history with the patient and they are unchanged from previous note.  ALLERGIES:  is allergic to codeine.  MEDICATIONS:  Current Outpatient Prescriptions  Medication Sig Dispense Refill  . albuterol (PROAIR HFA) 108 (90 BASE) MCG/ACT inhaler Inhale 1-2 puffs into the lungs daily as needed for wheezing or shortness of breath. 18 g 5  . amiodarone (PACERONE) 200 MG tablet Take 1 tablet (200 mg total) by mouth daily. 30 tablet 6  . Budesonide (PULMICORT FLEXHALER) 90 MCG/ACT inhaler Inhale 1 puff into the lungs daily.     . carvedilol (COREG) 12.5 MG tablet Take 25 mg by mouth daily.    . furosemide (LASIX) 40 MG tablet Take 1 tablet (40 mg total) by mouth daily. 20 tablet 0  . hydroxyurea (HYDREA) 500 MG capsule Take 2 capsules (1,000 mg total) by mouth daily. May take with food to minimize GI side effects. 90 capsule 3  . methadone (DOLOPHINE) 10 MG/5ML solution Take 46 mg by mouth every morning.     . tiotropium (SPIRIVA HANDIHALER) 18 MCG inhalation capsule Place 1 capsule (18 mcg total) into inhaler and inhale daily. 30 capsule 12  . warfarin (COUMADIN) 5 MG tablet Take 1 tablet (5 mg total) by mouth daily. (Patient taking differently: Take 2.5 mg by mouth daily. ) 30 tablet 1   No current facility-administered medications for this visit.    PHYSICAL EXAMINATION: ECOG PERFORMANCE STATUS: 0 - Asymptomatic  Filed Vitals:   12/21/14 0847  BP: 119/61  Pulse: 54  Temp: 98.4 F (36.9 C)  Resp: 18   Filed Weights   12/21/14 0847  Weight: 213 lb 11.2 oz (96.934 kg)    GENERAL:alert, no distress and  comfortable SKIN: skin color, texture, turgor are normal, no rashes or significant lesions EYES: normal, Conjunctiva are pink and non-injected, sclera clear OROPHARYNX:no exudate, no erythema and lips, buccal mucosa, and tongue normal  Musculoskeletal:no cyanosis of digits and no clubbing  NEURO:  alert & oriented x 3 with fluent speech, no focal motor/sensory deficits  LABORATORY DATA:  I have reviewed the data as listed    Component Value Date/Time   NA 141 12/04/2014 1015   NA 143 06/12/2013 1125   K 4.1 12/04/2014 1015   K 3.7 06/12/2013 1125   CL 105 12/04/2014 1015   CO2 28 12/04/2014 1015   CO2 32* 06/12/2013 1125   GLUCOSE 89 12/04/2014 1015   GLUCOSE 92 06/12/2013 1125   BUN 19 12/04/2014 1015   BUN 14.6 06/12/2013 1125   CREATININE 1.08 12/04/2014 1015   CREATININE 1.05 08/30/2014 1628   CREATININE 0.9 06/12/2013 1125   CALCIUM 8.5* 12/04/2014 1015   CALCIUM 8.9 06/12/2013 1125   PROT 7.4 10/31/2014 1822   PROT 6.9 06/12/2013 1125   ALBUMIN 3.3* 10/31/2014 1822   ALBUMIN 3.3* 06/12/2013 1125   AST 37 10/31/2014 1822   AST 21 06/12/2013 1125   ALT 30 10/31/2014 1822   ALT 28 06/12/2013 1125   ALKPHOS 70 10/31/2014 1822   ALKPHOS 112 06/12/2013 1125   BILITOT 0.7 10/31/2014 1822   BILITOT 0.82 06/12/2013 1125   GFRNONAA >60 12/04/2014 1015   GFRNONAA 73 08/30/2014 1628   GFRAA >60 12/04/2014 1015   GFRAA 84 08/30/2014 1628    No results found for: SPEP, UPEP  Lab Results  Component Value Date   WBC 7.5 12/21/2014   NEUTROABS 5.7 12/21/2014   HGB 13.2 12/21/2014   HCT 39.2 12/21/2014   MCV 125.1* 12/21/2014   PLT 137* 12/21/2014      Chemistry      Component Value Date/Time   NA 141 12/04/2014 1015   NA 143 06/12/2013 1125   K 4.1 12/04/2014 1015   K 3.7 06/12/2013 1125   CL 105 12/04/2014 1015   CO2 28 12/04/2014 1015   CO2 32* 06/12/2013 1125   BUN 19 12/04/2014 1015   BUN 14.6 06/12/2013 1125   CREATININE 1.08 12/04/2014 1015   CREATININE 1.05 08/30/2014 1628   CREATININE 0.9 06/12/2013 1125      Component Value Date/Time   CALCIUM 8.5* 12/04/2014 1015   CALCIUM 8.9 06/12/2013 1125   ALKPHOS 70 10/31/2014 1822   ALKPHOS 112 06/12/2013 1125   AST 37 10/31/2014 1822   AST 21 06/12/2013 1125   ALT 30 10/31/2014 1822   ALT 28  06/12/2013 1125   BILITOT 0.7 10/31/2014 1822   BILITOT 0.82 06/12/2013 1125     ASSESSMENT & PLAN:  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 He tolerated the hydroxyurea well.  After recent dose reduction, he tolerated hydroxyurea 2 tablets daily.  I will see him back in 4 months.     Thrombocytopenia due to drugs This is likely due to recent treatment and possible side-effects of recent addition of amiodarone. The patient denies recent history of bleeding such as epistaxis, hematuria or hematochezia. He is asymptomatic from the low platelet count.    Tobacco abuse I spent some time counseling the patient the importance of tobacco cessation. He is currently not interested to quit now.      No orders of the defined types were placed in this encounter.   All questions were answered.  The patient knows to call the clinic with any problems, questions or concerns. No barriers to learning was detected. I spent 15 minutes counseling the patient face to face. The total time spent in the appointment was 20 minutes and more than 50% was on counseling and review of test results     [facility], [place], MD 12/21/2014 10:35 AM

## 2014-12-21 NOTE — Assessment & Plan Note (Signed)
I spent some time counseling the patient the importance of tobacco cessation. He is currently not interested to quit now.  

## 2014-12-21 NOTE — Assessment & Plan Note (Signed)
The patient has JAK2 mutation positive myeloproliferative disorder. He tolerated the hydroxyurea well.  After recent dose reduction, he tolerated hydroxyurea 2 tablets daily.  I will see him back in 4 months.

## 2014-12-21 NOTE — Telephone Encounter (Signed)
Gave adn printed appt sched and avs for pt for OCT.[facility] in [place] on 10.4.16

## 2014-12-21 NOTE — Assessment & Plan Note (Signed)
This is likely due to recent treatment and possible side-effects of recent addition of amiodarone. The patient denies recent history of bleeding such as epistaxis, hematuria or hematochezia. He is asymptomatic from the low platelet count.

## 2014-12-24 ENCOUNTER — Ambulatory Visit (INDEPENDENT_AMBULATORY_CARE_PROVIDER_SITE_OTHER): Payer: Medicare Other | Admitting: Pharmacist

## 2014-12-24 DIAGNOSIS — I4891 Unspecified atrial fibrillation: Secondary | ICD-10-CM | POA: Diagnosis present

## 2014-12-24 DIAGNOSIS — Z7901 Long term (current) use of anticoagulants: Secondary | ICD-10-CM | POA: Diagnosis not present

## 2014-12-24 LAB — POCT INR: INR: 2.7

## 2014-12-24 NOTE — Progress Notes (Signed)
Anti-Coagulation Progress Note  [name] is a 69 y.o. male who is currently on an anti-coagulation regimen.    RECENT RESULTS: Recent results are below, the most recent result is correlated with a dose of 15 mg. per week: Lab Results  Component Value Date   INR 2.70 12/24/2014   INR 3.30 11/26/2014   INR 2.90 11/12/2014    ANTI-COAG DOSE: Anticoagulation Dose Instructions as of 12/24/2014      [name] Tue Wed Thu Fri Sat   New Dose 2.5 mg 2.5 mg 0 mg 2.5 mg 2.5 mg 2.5 mg 2.5 mg       ANTICOAG SUMMARY: Anticoagulation Episode Summary    Current INR goal 2.0-3.0  Next INR check 01/28/2015  INR from last check 2.70 (12/24/2014)  Weekly max dose   Target end date Indefinite  [facility] check [facility]  Preferred lab   Send INR reminders to    Indications  Atrial fibrillation [I48.91]        Comments         ANTICOAG TODAY: Anticoagulation Summary as of 12/24/2014    INR goal 2.0-3.0  Selected INR 2.70 (12/24/2014)  Next INR check 01/28/2015  Target end date Indefinite   Indications  Atrial fibrillation [I48.91]      Anticoagulation Episode Summary    [facility] check [facility]   Preferred lab    Send INR reminders to    Comments       PATIENT INSTRUCTIONS: Patient Instructions  Patient instructed to take medications as defined in the Anti-coagulation Track section of this encounter.  Patient instructed to take today's dose.  Patient verbalized understanding of these instructions.       FOLLOW-UP Return in 5 weeks (on 01/28/2015) for Follow up INR at 1000h.  [name], III Pharm.D., CACP

## 2014-12-24 NOTE — Patient Instructions (Signed)
Patient instructed to take medications as defined in the Anti-coagulation Track section of this encounter.  Patient instructed to take today's dose.  Patient verbalized understanding of these instructions.    

## 2014-12-24 NOTE — Telephone Encounter (Signed)
CSW placed called to pt to inquire if pt would be receptive to [facility] referral.  Pt's voicemail is not set up at this time.

## 2014-12-28 ENCOUNTER — Ambulatory Visit (INDEPENDENT_AMBULATORY_CARE_PROVIDER_SITE_OTHER): Payer: Medicare Other | Admitting: *Deleted

## 2014-12-28 DIAGNOSIS — R55 Syncope and collapse: Secondary | ICD-10-CM

## 2014-12-28 NOTE — Progress Notes (Signed)
Loop recorder 

## 2014-12-31 LAB — CUP PACEART REMOTE DEVICE CHECK: Date Time Interrogation Session: 20160613155638

## 2014-12-31 NOTE — Telephone Encounter (Signed)
Pt declines [facility] referral.  Stating "I'm doing pretty good now."  CSW will sign off.

## 2015-01-01 ENCOUNTER — Other Ambulatory Visit: Payer: Self-pay | Admitting: Internal Medicine

## 2015-01-02 ENCOUNTER — Other Ambulatory Visit: Payer: Self-pay

## 2015-01-02 MED ORDER — FUROSEMIDE 40 MG PO TABS: 40.0000 mg | ORAL_TABLET | Freq: Every day | ORAL | Status: DC

## 2015-01-04 ENCOUNTER — Encounter: Payer: Self-pay | Admitting: Internal Medicine

## 2015-01-10 ENCOUNTER — Other Ambulatory Visit: Payer: Self-pay | Admitting: *Deleted

## 2015-01-10 DIAGNOSIS — I4891 Unspecified atrial fibrillation: Secondary | ICD-10-CM

## 2015-01-10 MED ORDER — WARFARIN SODIUM 5 MG PO TABS: 2.5000 mg | ORAL_TABLET | Freq: Every day | ORAL | Status: DC

## 2015-01-11 ENCOUNTER — Encounter: Payer: Self-pay | Admitting: Internal Medicine

## 2015-01-28 ENCOUNTER — Ambulatory Visit (INDEPENDENT_AMBULATORY_CARE_PROVIDER_SITE_OTHER): Payer: Medicare Other | Admitting: Pharmacist

## 2015-01-28 ENCOUNTER — Ambulatory Visit (INDEPENDENT_AMBULATORY_CARE_PROVIDER_SITE_OTHER): Payer: Medicare Other | Admitting: *Deleted

## 2015-01-28 DIAGNOSIS — I4891 Unspecified atrial fibrillation: Secondary | ICD-10-CM

## 2015-01-28 DIAGNOSIS — R55 Syncope and collapse: Secondary | ICD-10-CM

## 2015-01-28 DIAGNOSIS — Z7901 Long term (current) use of anticoagulants: Secondary | ICD-10-CM

## 2015-01-28 LAB — POCT INR: INR: 2.9

## 2015-01-28 NOTE — Patient Instructions (Signed)
Patient instructed to take medications as defined in the Anti-coagulation Track section of this encounter.  Patient instructed to OMIT doses on Tuesdays and Fridays.  Patient verbalized understanding of these instructions.

## 2015-01-28 NOTE — Progress Notes (Signed)
Anti-Coagulation Progress Note  [name] is a 69 y.o. male who is currently on an anti-coagulation regimen.    RECENT RESULTS: Recent results are below, the most recent result is correlated with a dose of 15 mg. per week: Lab Results  Component Value Date   INR 2.90 01/28/2015   INR 2.70 12/24/2014   INR 3.30 11/26/2014    ANTI-COAG DOSE: Anticoagulation Dose Instructions as of 01/28/2015      [name] Tue Wed Thu Fri Sat   New Dose 2.5 mg 2.5 mg 0 mg 2.5 mg 2.5 mg 0 mg 2.5 mg       ANTICOAG SUMMARY: Anticoagulation Episode Summary    Current INR goal 2.0-3.0  Next INR check 02/18/2015  INR from last check 2.90 (01/28/2015)  Weekly max dose   Target end date Indefinite  [facility]  Preferred lab   Send INR reminders to    Indications  Atrial fibrillation [I48.91]        Comments         ANTICOAG TODAY: Anticoagulation Summary as of 01/28/2015    INR goal 2.0-3.0  Selected INR 2.90 (01/28/2015)  Next INR check 02/18/2015  Target end date Indefinite   Indications  Atrial fibrillation [I48.91]      Anticoagulation Episode Summary    [facility] check [facility]   Preferred lab    Send INR reminders to    Comments       PATIENT INSTRUCTIONS: Patient Instructions  Patient instructed to take medications as defined in the Anti-coagulation Track section of this encounter.  Patient instructed to OMIT doses on Tuesdays and Fridays.  Patient verbalized understanding of these instructions.       FOLLOW-UP Return in about 3 weeks (around 02/18/2015) for Follow up INR at 0915h.  [name], III Pharm.D., CACP

## 2015-01-29 NOTE — Progress Notes (Signed)
Loop recorder 

## 2015-01-29 NOTE — Patient Outreach (Signed)
[facility]) Care Management  01/29/2015  [place] Karl [facility] 02/11/1946 [id number]   Referral from [place] List, Maury Dus, RN assigned to outreach.  Ronnell Freshwater. [place], [place] Management [place] Assistant Phone: [phone] Fax: [phone]

## 2015-02-18 ENCOUNTER — Other Ambulatory Visit: Payer: Self-pay | Admitting: *Deleted

## 2015-02-18 ENCOUNTER — Encounter: Payer: Self-pay | Admitting: *Deleted

## 2015-02-18 NOTE — Patient Outreach (Signed)
[facility]) Care Management  02/18/2015  [name] [facility] [birth year] [id number]  RNCM telephone call to patient to discuss services of [facility]. Patient refused all services and stated he doesn't need any help. Explained to patient that the services is free. Patient refused all services.  [place] Care Management [phone]

## 2015-02-25 ENCOUNTER — Inpatient Hospital Stay (HOSPITAL_COMMUNITY)
Admission: EM | Admit: 2015-02-25 | Discharge: 2015-02-26 | DRG: 917 | Disposition: A | Discharge status: Home / Self Care | Payer: Medicare Other | Attending: Oncology | Admitting: Oncology

## 2015-02-25 ENCOUNTER — Encounter (HOSPITAL_COMMUNITY): Payer: Self-pay | Admitting: Emergency Medicine

## 2015-02-25 ENCOUNTER — Emergency Department (HOSPITAL_COMMUNITY): Payer: Medicare Other

## 2015-02-25 DIAGNOSIS — R4182 Altered mental status, unspecified: Secondary | ICD-10-CM | POA: Diagnosis present

## 2015-02-25 DIAGNOSIS — T40601A Poisoning by unspecified narcotics, accidental (unintentional), initial encounter: Secondary | ICD-10-CM

## 2015-02-25 DIAGNOSIS — G9340 Encephalopathy, unspecified: Secondary | ICD-10-CM | POA: Diagnosis not present

## 2015-02-25 DIAGNOSIS — I4891 Unspecified atrial fibrillation: Secondary | ICD-10-CM | POA: Diagnosis not present

## 2015-02-25 DIAGNOSIS — Z7901 Long term (current) use of anticoagulants: Secondary | ICD-10-CM

## 2015-02-25 DIAGNOSIS — J449 Chronic obstructive pulmonary disease, unspecified: Secondary | ICD-10-CM | POA: Diagnosis present

## 2015-02-25 DIAGNOSIS — N179 Acute kidney failure, unspecified: Secondary | ICD-10-CM | POA: Diagnosis present

## 2015-02-25 DIAGNOSIS — I110 Hypertensive heart disease with heart failure: Secondary | ICD-10-CM | POA: Diagnosis present

## 2015-02-25 DIAGNOSIS — I429 Cardiomyopathy, unspecified: Secondary | ICD-10-CM | POA: Diagnosis present

## 2015-02-25 DIAGNOSIS — F17210 Nicotine dependence, cigarettes, uncomplicated: Secondary | ICD-10-CM | POA: Diagnosis present

## 2015-02-25 DIAGNOSIS — R410 Disorientation, unspecified: Secondary | ICD-10-CM | POA: Diagnosis present

## 2015-02-25 DIAGNOSIS — D45 Polycythemia vera: Secondary | ICD-10-CM | POA: Diagnosis not present

## 2015-02-25 DIAGNOSIS — T50901A Poisoning by unspecified drugs, medicaments and biological substances, accidental (unintentional), initial encounter: Secondary | ICD-10-CM | POA: Diagnosis not present

## 2015-02-25 DIAGNOSIS — I5022 Chronic systolic (congestive) heart failure: Secondary | ICD-10-CM | POA: Diagnosis present

## 2015-02-25 DIAGNOSIS — I5033 Acute on chronic diastolic (congestive) heart failure: Secondary | ICD-10-CM | POA: Diagnosis present

## 2015-02-25 DIAGNOSIS — F1110 Opioid abuse, uncomplicated: Secondary | ICD-10-CM | POA: Diagnosis present

## 2015-02-25 LAB — ETHANOL: Alcohol, Ethyl (B): <5 mg/dL (ref <5)

## 2015-02-25 LAB — CBC
HEMATOCRIT: 35.6 % — AB (ref 39.0–52.0)
HEMOGLOBIN: 11.8 g/dL — AB (ref 13.0–17.0)
MCH: 40 pg — AB (ref 26.0–34.0)
MCHC: 33.1 g/dL (ref 30.0–36.0)
MCV: 120.7 fL — ABNORMAL HIGH (ref 78.0–100.0)
PLATELETS: 141 10*3/uL — AB (ref 150–400)
RBC: 2.95 MIL/uL — ABNORMAL LOW (ref 4.22–5.81)
RDW: 13.4 % (ref 11.5–15.5)
WBC: 4.2 10*3/uL (ref 4.0–10.5)

## 2015-02-25 LAB — CUP PACEART REMOTE DEVICE CHECK: MDC IDC SESS DTM: 20160808135528

## 2015-02-25 LAB — COMPREHENSIVE METABOLIC PANEL
ALT: 18 U/L (ref 17–63)
AST: 22 U/L (ref 15–41)
Albumin: 3.1 g/dL — ABNORMAL LOW (ref 3.5–5.0)
Alkaline Phosphatase: 75 U/L (ref 38–126)
Anion gap: 8 (ref 5–15)
BUN: 24 mg/dL — ABNORMAL HIGH (ref 6–20)
CHLORIDE: 103 mmol/L (ref 101–111)
CO2: 29 mmol/L (ref 22–32)
CREATININE: 1.33 mg/dL — AB (ref 0.61–1.24)
Calcium: 8.5 mg/dL — ABNORMAL LOW (ref 8.9–10.3)
GFR, EST NON AFRICAN AMERICAN: 54 mL/min — AB (ref >60)
Glucose, Bld: 114 mg/dL — ABNORMAL HIGH (ref 65–99)
Potassium: 3.6 mmol/L (ref 3.5–5.1)
Sodium: 140 mmol/L (ref 135–145)
Total Bilirubin: 0.7 mg/dL (ref 0.3–1.2)
Total Protein: 6.8 g/dL (ref 6.5–8.1)

## 2015-02-25 LAB — SALICYLATE LEVEL

## 2015-02-25 LAB — RAPID URINE DRUG SCREEN, HOSP PERFORMED
Amphetamines: NOT DETECTED
Barbiturates: NOT DETECTED
Benzodiazepines: NOT DETECTED
COCAINE: NOT DETECTED
Opiates: POSITIVE — AB
Tetrahydrocannabinol: NOT DETECTED

## 2015-02-25 LAB — PROTIME-INR
INR: 2.23 — AB (ref 0.00–1.49)
Prothrombin Time: 24.5 seconds — ABNORMAL HIGH (ref 11.6–15.2)

## 2015-02-25 LAB — ACETAMINOPHEN LEVEL: Acetaminophen (Tylenol), Serum: <10 ug/mL — ABNORMAL LOW (ref 10–30)

## 2015-02-25 MED ORDER — NALOXONE HCL 0.4 MG/ML IJ SOLN
0.4000 mg | Freq: Once | INTRAMUSCULAR | Status: AC
  Administered 2015-02-25: 0.4 mg via INTRAVENOUS
  Filled 2015-02-25: qty 1

## 2015-02-25 MED ORDER — SODIUM CHLORIDE 0.9 % IV BOLUS (SEPSIS)
1000.0000 mL | Freq: Once | INTRAVENOUS | Status: AC
  Administered 2015-02-25: 1000 mL via INTRAVENOUS

## 2015-02-25 NOTE — ED Notes (Signed)
[facility] ED[facility] and this nurse spoke to pt's wife re pt.  She reports that she is not comfortable taking pt home with her d/t his state.  Pt is drowsy but is verbally aroused.  She claims that pt does not recognize who she is, pt asked who she is and pt answered that her name is [name] and she is his wife.  She then started to say that she too has medical conditions and is not able to take care of pt at this state.  Pt is unable to stand on his own. [facility] agreed to re-admit pt to the ED and will be re-evaluated tomorrow.

## 2015-02-25 NOTE — ED Provider Notes (Addendum)
Patient was signed out for follow-up on mental status. The patient was suspected of having had an opiate overdose. Dr. [name] reported that he did awaken with the administration of Narcan. Dr. [name] had added a CT head to make sure there was no intracranial injury. I have rechecked on the patient on two occasions. On both occasions he awakens to light voice. The patient denied any pain. He reported he wasn't sure if he had overdosed on pain pills but he also did not consider it impossible.  The earlier ancillary history had been that the patient had history of inappropriate use of medications and narcotics.  Last check at 18:45, patient awakens and is alert and oriented 3. His vital signs are stable and he has no respiratory distress. He does advise that he has family members at home home he can contact. At this time, with CT head negative. Mental status change having resolved, and diagnosis consistent with narcotic overdose based on response to Narcan, I did feel the patient was stable for discharge. He did not express any suicidal ideation.  Charlesetta Shanks, MD 02/25/15 1904  After plan to discharge, the patient's wife reports that he is still not himself. His mental status is atypically confused and he is not at his normal level of alertness. She also notes that he is tremulous which is not typical for him. He and his wife deny alcohol use, abuse or history of withdrawal. The patient's wife reports that he may have ingested fentanyl. She reports she took some type of narcotic medication yesterday that he got off the street. She reports that he used to be on methadone treatment and this was discontinued 2 weeks ago without replacement narcotics.  Patient has dilated pupils and subtle ataxia. He has not required any recurrent dose of Narcan. He has maintained stable airway. At this point the patient will be admitted for persistent cognitive impairment and somnolence post ingestion. This appears most  likely to be a combined congestion of narcotic and a likely coingestion resulting in the concomitant symptoms of pupillary dilation and incoordination.  Charlesetta Shanks, MD 02/26/15 [phone]

## 2015-02-25 NOTE — ED Provider Notes (Signed)
CSN: [id number]     Arrival date & time 02/25/15  1249 History   First MD Initiated Contact with Patient 02/25/15 1257     Chief Complaint  Patient presents with  . Altered Mental Status  . Drug Overdose     (Consider location/radiation/quality/duration/timing/severity/associated sxs/prior Treatment) HPI..... Level V caveat for altered mental status. History is of questionable value: Patient either injected or snorted an unknown medication.  This history was apparently obtained from family member. He has a known history of similar behavior. Nurse reports that he has snorted his blood pressure medication in the past. He has multiple health problems well documented in his past medical history.   Past Medical History  Diagnosis Date  . Cardiomyopathy     EF55% 11/14<<35%   . Substance abuse     alcohol  . Hypertension   . Atrial fibrillation /flutter     hx of flutter ablation 2/12  . Shortness of breath     "all the time lately" (12/14/2012)  . GERD (gastroesophageal reflux disease)   . Hepatitis     "think I had that once; a long time ago; from dirty needles I think" (12/14/2012)  . 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 Leukocytosis, unspecified 06/12/2013  . 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 Obesity   . Syncope   . Cancer   . CHF (congestive heart failure)   . Renal insufficiency   . COPD (chronic obstructive pulmonary disease)    Past Surgical History  Procedure Laterality Date  . Hemorrhoid surgery  1970's  . Cardiac electrophysiology mapping and ablation  08/2010    [name] 09/07/2010 (12/14/2012)  . Loop recorder implant  08-22-2013    MDT LinQ implanted by Dr Caryl Comes for syncope  . Loop recorder implant N/A 08/23/2013    Procedure: LOOP RECORDER IMPLANT;  Surgeon: Deboraha Sprang, MD;  Location: [facility] CATH LAB;  Service: Cardiovascular;  Laterality: N/A;  . Colonoscopy      15-20 years ago had colon in [place]   Family History  Problem Relation Age of Onset  . Diabetes Mother    . Hypertension Mother   . Cirrhosis Father   . Alcohol abuse Father   . Colon cancer Neg Hx   . Rectal cancer Neg Hx   . Stomach cancer Neg Hx    History  Substance Use Topics  . Smoking status: Current Every Day Smoker -- 0.20 packs/day for 40 years    Types: Cigarettes  . Smokeless tobacco: Never Used     Comment: patient smoked 1-2 cigs x 50 years, now smoking approx 4-5 cigs per day x 1 year  . Alcohol Use: 0.0 oz/week    0 Standard drinks or equivalent per week    Review of Systems  Unable to perform ROS: Acuity of condition      Allergies  Codeine  Home Medications   Prior to Admission medications   Medication Sig Start Date End Date Taking? Authorizing Provider  albuterol (PROAIR HFA) 108 (90 BASE) MCG/ACT inhaler Inhale 1-2 puffs into the lungs daily as needed for wheezing or shortness of breath. 05/28/14   Nino Glow McLean-Scocozza, MD  amiodarone (PACERONE) 200 MG tablet Take 1 tablet (200 mg total) by mouth daily. 12/10/14   Norman Herrlich, MD  Budesonide (PULMICORT FLEXHALER) 90 MCG/ACT inhaler Inhale 1 puff into the lungs daily.     Historical Provider, MD  carvedilol (COREG) 12.5 MG tablet Take 25 mg by mouth daily.    Historical Provider, MD  furosemide (LASIX) 40 MG tablet Take 1 tablet (40 mg total) by mouth daily. 01/02/15   Evans Lance, MD  hydroxyurea (HYDREA) 500 MG capsule Take 2 capsules (1,000 mg total) by mouth daily. May take with food to minimize GI side effects. 08/24/14   Heath Lark, MD  methadone (DOLOPHINE) 10 MG/5ML solution Take 46 mg by mouth every morning.     Historical Provider, MD  tiotropium (SPIRIVA HANDIHALER) 18 MCG inhalation capsule Place 1 capsule (18 mcg total) into inhaler and inhale daily. 05/03/14   Kinnie Feil, MD  warfarin (COUMADIN) 5 MG tablet Take 0.5 tablets (2.5 mg total) by mouth daily. 01/10/15   Sid Falcon, MD   BP 127/73 mmHg  Pulse 71  Temp(Src) 98.1 F (36.7 C) (Oral)  Resp 12  SpO2 100% Physical Exam   Constitutional:  Patient is obtunded but will respond to questions  HENT:  Head: Normocephalic and atraumatic.  Eyes: Conjunctivae and EOM are normal. Pupils are equal, round, and reactive to light.  Neck: Normal range of motion. Neck supple.  Cardiovascular: Normal rate and regular rhythm.   Pulmonary/Chest: Effort normal and breath sounds normal.  Abdominal: Soft. Bowel sounds are normal.  Musculoskeletal: Normal range of motion.  Neurological:  Will open his eyes and mumble answers to questions  Skin: Skin is warm and dry.  Psychiatric:  Sleepy  Nursing note and vitals reviewed.   ED Course  Procedures (including critical care time) Labs Review Labs Reviewed  COMPREHENSIVE METABOLIC PANEL - Abnormal; Notable for the following:    Glucose, Bld 114 (*)    BUN 24 (*)    Creatinine, Ser 1.33 (*)    Calcium 8.5 (*)    Albumin 3.1 (*)    GFR calc non Af Amer 54 (*)    All other components within normal limits  ACETAMINOPHEN LEVEL - Abnormal; Notable for the following:    Acetaminophen (Tylenol), Serum <10 (*)    All other components within normal limits  CBC - Abnormal; Notable for the following:    RBC 2.95 (*)    Hemoglobin 11.8 (*)    HCT 35.6 (*)    MCV 120.7 (*)    MCH 40.0 (*)    Platelets 141 (*)    All other components within normal limits  ETHANOL  SALICYLATE LEVEL  URINE RAPID DRUG SCREEN, HOSP PERFORMED  PROTIME-INR    Imaging Review No results found.   EKG Interpretation   Date/Time:  Monday February 25 2015 12:50:22 EDT Ventricular Rate:  64 PR Interval:  175 QRS Duration: 98 QT Interval:  478 QTC Calculation: 493 R Axis:   3 Text Interpretation:  Sinus rhythm Abnormal R-wave progression, early  transition Borderline T wave abnormalities Borderline prolonged QT  interval Confirmed by [name]  MD, Niara Bunker ([id number]) on 02/25/2015 3:08:06 PM     CRITICAL CARE Performed by: Nat Christen Total critical care time: 30 Critical care time was exclusive of  separately billable procedures and treating other patients. Critical care was necessary to treat or prevent imminent or life-threatening deterioration. Critical care was time spent personally by me on the following activities: development of treatment plan with patient and/or surrogate as well as nursing, discussions with consultants, evaluation of patient's response to treatment, examination of patient, obtaining history from patient or surrogate, ordering and performing treatments and interventions, ordering and review of laboratory studies, ordering and review of radiographic studies, pulse oximetry and re-evaluation of patient's condition. MDM   Final diagnoses:  Altered  mental status, unspecified altered mental status type    Patient appears to be more alert after Narcan 0.4 mg given. Alcohol level negative. Drug screen pending. Will obtain CT head to rule out catastrophic neurological event. Discussed with Dr. Johnney Killian.    Nat Christen, MD 02/25/15 [phone]

## 2015-02-25 NOTE — ED Notes (Signed)
Attempted to collect urine on patient, pt unable to void at this time. [name], MD, made aware. No new orders at this time.

## 2015-02-25 NOTE — ED Notes (Signed)
Bed: RESA Expected date:  Expected time:  Means of arrival:  Comments: EMS 69 yo M, possible heroine overdose, lethargic, hypotensive

## 2015-02-25 NOTE — ED Notes (Signed)
Pt's family member, [name], states she will send someone to retrieve pt. Brenda's phone number is: [phone].

## 2015-02-25 NOTE — ED Notes (Addendum)
Per EMS, pt found on couch sitting up but lethargic, last seen normal yesterday at 1900. Pt states he took an IV substance he believed to be heroine at an unknown time. Pt's girlfriend states that at 1157 today she heard a loud thump and found pt laying on the floor with generalized cyanosis. Pt's girlfriend states that this has happened in the past and that she "punches him in the chest and breathes for him" when this happens, but that she did not need to do that today. Pupils equal, round, and dilated. Pt currently lethargic but arousable to voice.

## 2015-02-25 NOTE — Patient Outreach (Signed)
[facility]) Care Management  02/25/2015  Syaire Sab[facility] [birth year] [phone]   Notification from Johny Shock, RN to close case due to patient refused [place] Management services.  Ronnell Freshwater. [place], [place] Management [place] Assistant Phone: [phone] Fax: [phone]

## 2015-02-26 ENCOUNTER — Ambulatory Visit (INDEPENDENT_AMBULATORY_CARE_PROVIDER_SITE_OTHER): Payer: Medicare Other | Admitting: *Deleted

## 2015-02-26 ENCOUNTER — Encounter (HOSPITAL_COMMUNITY): Payer: Self-pay | Admitting: General Practice

## 2015-02-26 DIAGNOSIS — T40601A Poisoning by unspecified narcotics, accidental (unintentional), initial encounter: Secondary | ICD-10-CM

## 2015-02-26 DIAGNOSIS — I10 Essential (primary) hypertension: Secondary | ICD-10-CM

## 2015-02-26 DIAGNOSIS — R55 Syncope and collapse: Secondary | ICD-10-CM | POA: Diagnosis not present

## 2015-02-26 DIAGNOSIS — F17210 Nicotine dependence, cigarettes, uncomplicated: Secondary | ICD-10-CM

## 2015-02-26 DIAGNOSIS — G9340 Encephalopathy, unspecified: Secondary | ICD-10-CM | POA: Diagnosis not present

## 2015-02-26 DIAGNOSIS — F1420 Cocaine dependence, uncomplicated: Secondary | ICD-10-CM | POA: Diagnosis not present

## 2015-02-26 DIAGNOSIS — N179 Acute kidney failure, unspecified: Secondary | ICD-10-CM

## 2015-02-26 DIAGNOSIS — F1120 Opioid dependence, uncomplicated: Secondary | ICD-10-CM

## 2015-02-26 DIAGNOSIS — D45 Polycythemia vera: Secondary | ICD-10-CM

## 2015-02-26 DIAGNOSIS — I429 Cardiomyopathy, unspecified: Secondary | ICD-10-CM | POA: Diagnosis not present

## 2015-02-26 DIAGNOSIS — J449 Chronic obstructive pulmonary disease, unspecified: Secondary | ICD-10-CM

## 2015-02-26 DIAGNOSIS — Z7901 Long term (current) use of anticoagulants: Secondary | ICD-10-CM

## 2015-02-26 DIAGNOSIS — T50901A Poisoning by unspecified drugs, medicaments and biological substances, accidental (unintentional), initial encounter: Secondary | ICD-10-CM | POA: Diagnosis not present

## 2015-02-26 DIAGNOSIS — R4182 Altered mental status, unspecified: Secondary | ICD-10-CM | POA: Insufficient documentation

## 2015-02-26 DIAGNOSIS — I5022 Chronic systolic (congestive) heart failure: Secondary | ICD-10-CM | POA: Diagnosis not present

## 2015-02-26 DIAGNOSIS — Z7951 Long term (current) use of inhaled steroids: Secondary | ICD-10-CM

## 2015-02-26 DIAGNOSIS — R410 Disorientation, unspecified: Secondary | ICD-10-CM | POA: Diagnosis present

## 2015-02-26 DIAGNOSIS — D696 Thrombocytopenia, unspecified: Secondary | ICD-10-CM

## 2015-02-26 DIAGNOSIS — I110 Hypertensive heart disease with heart failure: Secondary | ICD-10-CM | POA: Diagnosis not present

## 2015-02-26 DIAGNOSIS — I4891 Unspecified atrial fibrillation: Secondary | ICD-10-CM | POA: Diagnosis not present

## 2015-02-26 DIAGNOSIS — F1110 Opioid abuse, uncomplicated: Secondary | ICD-10-CM | POA: Diagnosis present

## 2015-02-26 DIAGNOSIS — Z95818 Presence of other cardiac implants and grafts: Secondary | ICD-10-CM

## 2015-02-26 DIAGNOSIS — I509 Heart failure, unspecified: Secondary | ICD-10-CM

## 2015-02-26 DIAGNOSIS — Y929 Unspecified place or not applicable: Secondary | ICD-10-CM

## 2015-02-26 LAB — CBC
HCT: 35.8 % — ABNORMAL LOW (ref 39.0–52.0)
HEMOGLOBIN: 11.9 g/dL — AB (ref 13.0–17.0)
MCH: 40.2 pg — ABNORMAL HIGH (ref 26.0–34.0)
MCHC: 33.2 g/dL (ref 30.0–36.0)
MCV: 120.9 fL — ABNORMAL HIGH (ref 78.0–100.0)
Platelets: 130 10*3/uL — ABNORMAL LOW (ref 150–400)
RBC: 2.96 MIL/uL — ABNORMAL LOW (ref 4.22–5.81)
RDW: 13.5 % (ref 11.5–15.5)
WBC: 3.8 10*3/uL — ABNORMAL LOW (ref 4.0–10.5)

## 2015-02-26 LAB — BASIC METABOLIC PANEL
Anion gap: 7 (ref 5–15)
BUN: 22 mg/dL — ABNORMAL HIGH (ref 6–20)
CALCIUM: 8.3 mg/dL — AB (ref 8.9–10.3)
CHLORIDE: 102 mmol/L (ref 101–111)
CO2: 30 mmol/L (ref 22–32)
CREATININE: 1.25 mg/dL — AB (ref 0.61–1.24)
GFR calc Af Amer: >60 mL/min (ref >60)
GFR calc non Af Amer: 58 mL/min — ABNORMAL LOW (ref >60)
Glucose, Bld: 94 mg/dL (ref 65–99)
POTASSIUM: 3.5 mmol/L (ref 3.5–5.1)
Sodium: 139 mmol/L (ref 135–145)

## 2015-02-26 LAB — CUP PACEART REMOTE DEVICE CHECK: Date Time Interrogation Session: 20160809123528

## 2015-02-26 LAB — MRSA PCR SCREENING: MRSA by PCR: NEGATIVE

## 2015-02-26 MED ORDER — CARVEDILOL 12.5 MG PO TABS: 12.5000 mg | ORAL_TABLET | Freq: Two times a day (BID) | ORAL | Status: DC

## 2015-02-26 MED ORDER — AMIODARONE HCL 200 MG PO TABS
200.0000 mg | ORAL_TABLET | Freq: Every day | ORAL | Status: DC
  Administered 2015-02-26: 200 mg via ORAL
  Filled 2015-02-26: qty 1

## 2015-02-26 MED ORDER — THIAMINE HCL 100 MG/ML IJ SOLN: 100.0000 mg | Freq: Every day | INTRAMUSCULAR | Status: DC

## 2015-02-26 MED ORDER — SODIUM CHLORIDE 0.9 % IV SOLN
INTRAVENOUS | Status: AC
  Administered 2015-02-26: 04:00:00 via INTRAVENOUS

## 2015-02-26 MED ORDER — BUDESONIDE 0.25 MG/2ML IN SUSP
0.2500 mg | Freq: Two times a day (BID) | RESPIRATORY_TRACT | Status: DC
  Administered 2015-02-26: 0.25 mg via RESPIRATORY_TRACT
  Filled 2015-02-26: qty 2

## 2015-02-26 MED ORDER — LORAZEPAM 1 MG PO TABS: 1.0000 mg | ORAL_TABLET | Freq: Four times a day (QID) | ORAL | Status: DC | PRN

## 2015-02-26 MED ORDER — SODIUM CHLORIDE 0.9 % IV SOLN: INTRAVENOUS | Status: DC

## 2015-02-26 MED ORDER — WARFARIN - PHYSICIAN DOSING INPATIENT: Freq: Every day | Status: DC

## 2015-02-26 MED ORDER — TIOTROPIUM BROMIDE MONOHYDRATE 18 MCG IN CAPS
18.0000 ug | ORAL_CAPSULE | Freq: Every day | RESPIRATORY_TRACT | Status: DC
  Administered 2015-02-26: 18 ug via RESPIRATORY_TRACT
  Filled 2015-02-26 (×2): qty 5

## 2015-02-26 MED ORDER — LORAZEPAM 2 MG/ML IJ SOLN: 1.0000 mg | Freq: Four times a day (QID) | INTRAMUSCULAR | Status: DC | PRN

## 2015-02-26 MED ORDER — WARFARIN SODIUM 2.5 MG PO TABS: 2.5000 mg | ORAL_TABLET | Freq: Every day | ORAL | Status: DC

## 2015-02-26 MED ORDER — HYDROXYUREA 500 MG PO CAPS
1000.0000 mg | ORAL_CAPSULE | Freq: Every day | ORAL | Status: DC
  Filled 2015-02-26 (×2): qty 2

## 2015-02-26 MED ORDER — WARFARIN - PHARMACIST DOSING INPATIENT: Freq: Every day | Status: DC

## 2015-02-26 MED ORDER — HYDROXYUREA 500 MG PO CAPS: 1500.0000 mg | ORAL_CAPSULE | Freq: Every day | ORAL | Status: DC

## 2015-02-26 MED ORDER — FOLIC ACID 1 MG PO TABS
1.0000 mg | ORAL_TABLET | Freq: Every day | ORAL | Status: DC
  Administered 2015-02-26: 1 mg via ORAL
  Filled 2015-02-26: qty 1

## 2015-02-26 MED ORDER — SODIUM CHLORIDE 0.9 % IJ SOLN
3.0000 mL | Freq: Two times a day (BID) | INTRAMUSCULAR | Status: DC
  Administered 2015-02-26: 3 mL via INTRAVENOUS

## 2015-02-26 MED ORDER — FUROSEMIDE 40 MG PO TABS: 40.0000 mg | ORAL_TABLET | Freq: Every day | ORAL | Status: DC

## 2015-02-26 MED ORDER — ALBUTEROL SULFATE HFA 108 (90 BASE) MCG/ACT IN AERS: 1.0000 | INHALATION_SPRAY | Freq: Every day | RESPIRATORY_TRACT | Status: DC | PRN

## 2015-02-26 MED ORDER — WARFARIN SODIUM 2.5 MG PO TABS: 2.5000 mg | ORAL_TABLET | Freq: Once | ORAL | Status: DC

## 2015-02-26 MED ORDER — ADULT MULTIVITAMIN W/MINERALS CH: 1.0000 | ORAL_TABLET | Freq: Every day | ORAL | Status: DC

## 2015-02-26 MED ORDER — ADULT MULTIVITAMIN W/MINERALS CH
1.0000 | ORAL_TABLET | Freq: Every day | ORAL | Status: DC
  Administered 2015-02-26: 1 via ORAL
  Filled 2015-02-26: qty 1

## 2015-02-26 MED ORDER — VITAMIN B-1 100 MG PO TABS
100.0000 mg | ORAL_TABLET | Freq: Every day | ORAL | Status: DC
  Administered 2015-02-26: 100 mg via ORAL
  Filled 2015-02-26: qty 1

## 2015-02-26 MED ORDER — BUDESONIDE 90 MCG/ACT IN AEPB: INHALATION_SPRAY | Freq: Every day | RESPIRATORY_TRACT | Status: DC

## 2015-02-26 MED ORDER — ALBUTEROL SULFATE (2.5 MG/3ML) 0.083% IN NEBU
2.5000 mg | INHALATION_SOLUTION | Freq: Every day | RESPIRATORY_TRACT | Status: DC | PRN
Start: 2015-02-26 — End: 2015-02-26

## 2015-02-26 MED ORDER — FOLIC ACID 1 MG PO TABS: 1.0000 mg | ORAL_TABLET | Freq: Every day | ORAL | Status: DC

## 2015-02-26 NOTE — Discharge Summary (Signed)
Name: [name] MRN: [id number] DOB: [birth year] 69 y.o. PCP: [name]  Date of Admission: 02/25/2015 12:49 PM Date of Discharge: 02/26/2015 Attending Physician: Ryan Belt, MD  Discharge Diagnosis: 1. AMS secondary to unknown drug abuse (likely heroin) Principal Problem:   Accidental drug overdose Active Problems:   Chronic systolic heart failure   Chronic anticoagulation   Narcotic abuse   Confusion   Acute encephalopathy  Discharge Medications:   Medication List    TAKE these medications        albuterol 108 (90 BASE) MCG/ACT inhaler  Commonly known as:  PROAIR HFA  Inhale 1-2 puffs into the lungs daily as needed for wheezing or shortness of breath.     amiodarone 200 MG tablet  Commonly known as:  PACERONE  Take 1 tablet (200 mg total) by mouth daily.     carvedilol 12.5 MG tablet  Commonly known as:  COREG  Take 12.5 mg by mouth 2 (two) times daily with a meal.     folic acid 1 MG tablet  Commonly known as:  FOLVITE  Take 1 tablet (1 mg total) by mouth daily.     furosemide 40 MG tablet  Commonly known as:  LASIX  Take 1 tablet (40 mg total) by mouth daily.     hydroxyurea 500 MG capsule  Commonly known as:  HYDREA  Take 2 capsules (1,000 mg total) by mouth daily. May take with food to minimize GI side effects.     methadone 10 MG/5ML solution  Commonly known as:  DOLOPHINE  Take 46 mg by mouth every morning.     multivitamin with minerals Tabs tablet  Take 1 tablet by mouth daily.     PULMICORT FLEXHALER 90 MCG/ACT inhaler  Generic drug:  Budesonide  Inhale 1 puff into the lungs daily.     tiotropium 18 MCG inhalation capsule  Commonly known as:  SPIRIVA HANDIHALER  Place 1 capsule (18 mcg total) into inhaler and inhale daily.     warfarin 5 MG tablet  Commonly known as:  COUMADIN  Take 0.5 tablets (2.5 mg total) by mouth daily.        Disposition and follow-up:   RyanRyan Hamilton was discharged  from [name] in [name] condition.  At the hospital follow up visit please address:  1.  Methadone dose and re-start his pain clinic regimen- Appt made this Thursday with Dr. Rowe Hamilton at [facility].    INR monitoring   2.  Labs / imaging needed at time of follow-up: INR, UDS, CBC, BMET Follow up on creatinine  3.  Pending labs/ test needing follow-up: none  Follow-up Appointments:     Follow-up Information    Please follow up.   Why:  See your family doctor for recheck this week. Use the resource guide for drug abuse/dependency.      Follow up with [name], [name]. Go on 02/28/2015.   Specialty:  Nurse Practitioner   Why:  [facility] FOLLOW UP AT 9:30 AM   Contact information:   [place] [address] [phone]       Discharge Instructions: Discharge Instructions    Diet - low sodium heart healthy    Complete by:  As directed      Discharge instructions    Complete by:  As directed   Please follow up to [facility] on Thursday at 9:30 AM  With Dr. Elberta Hamilton  [name] [phone]     Increase activity slowly    Complete by:  As directed            Consultations:    Procedures Performed:  Ct Head Wo Contrast  02/25/2015   CLINICAL DATA:  Altered mental status. Pt's girlfriend states that at 1157 today she heard a loud thump and found pt laying on the floor with generalized cyanosis.  EXAM: CT HEAD WITHOUT CONTRAST  TECHNIQUE: Contiguous axial images were obtained from the base of the skull through the vertex without intravenous contrast.  COMPARISON:  06/06/2014  FINDINGS: There is no evidence of mass effect, midline shift, or extra-axial fluid collections. There is no evidence of a space-occupying lesion or intracranial hemorrhage. There is no evidence of a cortical-based area of acute infarction. There is generalized cerebral atrophy. There is periventricular white matter low attenuation  likely secondary to microangiopathy.  The ventricles and sulci are appropriate for the patient's age. The basal cisterns are patent.  Visualized portions of the orbits are unremarkable. There is a small air-fluid level in the left maxillary sinus. There is bilateral ethmoid sinus and right maxillary sinus mild mucosal thickening. Cerebrovascular atherosclerotic calcifications are noted.  The osseous structures are unremarkable.  IMPRESSION: 1. No acute intracranial pathology. 2. Chronic microvascular disease and cerebral atrophy.   Electronically Signed   By: [name]   On: 02/25/2015 17:08    2D Echo:   Cardiac Cath:   Admission HPI:  [name] is a 69 y.o. 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 Patient was originally seen at [facility] ED for suspected opiate overdose where he presented with altered mental status after snorting an unknown substance. In talking to the patient, he snorted what he thought to be heroin that he had purchased off the street, but, ultimately, he is unsure of what he took. He was given Narcan 0.60m and did awake with that administration. At that time, per ED notes, patient was alert and oriented x 3, vital signs stable, and no respiratory distress. He had a negative head CT, resolved AMS, and planned for discharge. However, after this plan, patients girlfriend, [name] reported that he was not at his baseline level of alertness and had remained confused and also noted tremulous, which is atypical for this patient. Patient did not require subsequent doses of Narcan and airway remained stable. Patient used to be on methadone treatment and this was discontinued on 02/07/15. He has been  without methadone since that time and been supplementing with heroin and fentanyl purchased off the streets. On exam, patient is alert & oriented x3, somnolent but arousable. There is nobody else in the room to help provide the history so much of HPI obtained from ED notes and phone conversation later had with patients girlfriend. Patient denies history of alcohol withdrawal, and states that he has not injected himself with drugs for 2 years and that he only snorts them.  Hospital Course by problem list: Principal Problem:   Accidental drug overdose Active Problems:   Chronic systolic heart failure   Chronic anticoagulation   Narcotic abuse   Confusion   Acute encephalopathy   1. AMS secondary to substance abuse:  89year old polysubstance abuser [facility] July [facility] because he failed to keep a scheduled visit. This led to recurrent drug-seeking behavior on the street. He came in the ER at [facility] with increasing somnolence. Labs showed normal electrolytes, but mild pancytopenia.  Liver functions normal, Drug screen positive for opiates, Screen for methadone positive, and Alcohol, salicylate, and acetaminophen were not detected. CT scan of the brain showed no acute pathology. He was still jittery and his girlfriend did not think he was back to his baseline mental status and we were asked to admit him to the medical service for observation. According to patients girlfriend, he has been a patient at [facility] for a while, but left town on 7/21 without notifying the clinic. As a result, they would not provide him with anymore methadone. Since that time, he has been trying to get reinstated but has been supplementing with heroin and fentanyl purchased off the streets. According to girlfriend, patient starting drinking alcohol again at this point in time. States he has not injected himself for a couple years and that he snorts his  drugs.  Symptoms rapidly reversible with 1 dose of Narcan and overnight hydration and he was medically stable for discharge with an appointment scheduled for This Thursday with Dr. Rowe Hamilton at [facility] pain clinic for resuming his previous methadone dose of 50 mg per day.  PCV:  was mildly pancytopenic and had a marked elevation of his MCV secondary to the known effects of hydroxyurea which he takes to control polycythemia vera. He follows Dr. 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 His hydroxyurea was continued during the 12 hours of admission.   COPD:  his COPD was stable during the 10 hours of hospital admission and he is on proventil, pulmicort, and spiriva.   Afib:  on lifetime coumadin and amiodarone s/p ablation and ILR due to syncope. His INR was 2.23, PT 24.5. Amiodarone 215m was continued and also warfarin dosing per pharmacy.    Discharge Vitals:   BP 94/73 mmHg  Pulse 64  Temp(Src) 97.5 F (36.4 C) (Oral)  Resp 18  Ht 5' 6" (1.676 m)  Wt 213 lb 10 oz (96.9 kg)  BMI 34.50 kg/m2  SpO2 97%  Discharge Labs:  Results for orders placed or performed during the hospital encounter of 02/25/15 (from the past 24 hour(s))  Comprehensive metabolic panel     Status: Abnormal   Collection Time: 02/25/15  1:11 PM  Result Value Ref Range   Sodium 140 135 - 145 mmol/L   Potassium 3.6 3.5 - 5.1 mmol/L   Chloride 103 101 - 111 mmol/L   CO2 29 22 - 32 mmol/L   Glucose, Bld 114 (H) 65 - 99 mg/dL   BUN 24 (H) 6 - 20 mg/dL   Creatinine, Hamilton 1.33 (H) 0.61 - 1.24 mg/dL   Calcium 8.5 (L) 8.9 - 10.3 mg/dL   Total Protein 6.8 6.5 - 8.1 g/dL   Albumin 3.1 (L) 3.5 - 5.0 g/dL   AST 22 15 - 41 U/L   ALT 18 17 - 63 U/L   Alkaline Phosphatase 75 38 - 126 U/L   Total Bilirubin 0.7 0.3 - 1.2 mg/dL   GFR calc non Af Amer 54 (L) >60 mL/min   GFR calc Af Amer >60 >60 mL/min   Anion gap 8 5 - 15  CBC     Status: Abnormal   Collection  Time: 02/25/15  1:11 PM  Result Value Ref  Range   WBC 4.2 4.0 - 10.5 K/uL   RBC 2.95 (L) 4.22 - 5.81 MIL/uL   Hemoglobin 11.8 (L) 13.0 - 17.0 g/dL   HCT 35.6 (L) 39.0 - 52.0 %   MCV 120.7 (H) 78.0 - 100.0 fL   MCH 40.0 (H) 26.0 - 34.0 pg   MCHC 33.1 30.0 - 36.0 g/dL   RDW 13.4 11.5 - 15.5 %   Platelets 141 (L) 150 - 400 K/uL  Ethanol (ETOH)     Status: None   Collection Time: 02/25/15  1:13 PM  Result Value Ref Range   Alcohol, Ethyl (B) <5 <5 mg/dL  Salicylate level     Status: None   Collection Time: 02/25/15  1:13 PM  Result Value Ref Range   Salicylate Lvl <7.4 2.8 - 30.0 mg/dL  Acetaminophen level     Status: Abnormal   Collection Time: 02/25/15  1:13 PM  Result Value Ref Range   Acetaminophen (Tylenol), Serum <10 (L) 10 - 30 ug/mL  Urine rapid drug screen (hosp performed) (Not at [facility])     Status: Abnormal   Collection Time: 02/25/15  4:34 PM  Result Value Ref Range   Opiates POSITIVE (A) NONE DETECTED   Cocaine NONE DETECTED NONE DETECTED   Benzodiazepines NONE DETECTED NONE DETECTED   Amphetamines NONE DETECTED NONE DETECTED   Tetrahydrocannabinol NONE DETECTED NONE DETECTED   Barbiturates NONE DETECTED NONE DETECTED  Protime-INR     Status: Abnormal   Collection Time: 02/25/15  4:36 PM  Result Value Ref Range   Prothrombin Time 24.5 (H) 11.6 - 15.2 seconds   INR 2.23 (H) 0.00 - 1.49  MRSA PCR Screening     Status: None   Collection Time: 02/26/15  3:31 AM  Result Value Ref Range   MRSA by PCR NEGATIVE NEGATIVE  Basic metabolic panel     Status: Abnormal   Collection Time: 02/26/15  7:00 AM  Result Value Ref Range   Sodium 139 135 - 145 mmol/L   Potassium 3.5 3.5 - 5.1 mmol/L   Chloride 102 101 - 111 mmol/L   CO2 30 22 - 32 mmol/L   Glucose, Bld 94 65 - 99 mg/dL   BUN 22 (H) 6 - 20 mg/dL   Creatinine, Hamilton 1.25 (H) 0.61 - 1.24 mg/dL   Calcium 8.3 (L) 8.9 - 10.3 mg/dL   GFR calc non Af Amer 58 (L) >60 mL/min   GFR calc Af Amer >60 >60 mL/min   Anion gap 7 5  - 15  CBC     Status: Abnormal   Collection Time: 02/26/15  7:00 AM  Result Value Ref Range   WBC 3.8 (L) 4.0 - 10.5 K/uL   RBC 2.96 (L) 4.22 - 5.81 MIL/uL   Hemoglobin 11.9 (L) 13.0 - 17.0 g/dL   HCT 35.8 (L) 39.0 - 52.0 %   MCV 120.9 (H) 78.0 - 100.0 fL   MCH 40.2 (H) 26.0 - 34.0 pg   MCHC 33.2 30.0 - 36.0 g/dL   RDW 13.5 11.5 - 15.5 %   Platelets 130 (L) 150 - 400 K/uL    Signed: Burgess Estelle, MD 02/26/2015, 11:29 AM    Services Ordered on Discharge:  Equipment Ordered on Discharge:

## 2015-02-26 NOTE — Progress Notes (Signed)
Loop recorder 

## 2015-02-26 NOTE — Progress Notes (Signed)
Received pt.from [facility] from [facility] but arousable..oriented x3.no acute distress.V/Staken & recorded .IV in place w/ occlusive dsd.intact.no redness,swelling noted.Oriented to the room ,call bell & information packet given to pt.;refused to watch safety video this time .Admission INP armband ID verified w/ pt. & applied.Fall assessment completedw/ pt. & able to verbalize understanding of risk associated w/ falls.Call light w/in reach.Skin dry & intact.Will continue to monitor pt.

## 2015-02-26 NOTE — Progress Notes (Addendum)
ANTICOAGULATION CONSULT NOTE - Initial Consult  Pharmacy Consult for Warfarin Indication: Afib/flutter  Allergies  Allergen Reactions  . Codeine Rash    Patient Measurements: Height: 5\' 6"  (167.6 cm) Weight: 213 lb 10 oz (96.9 kg) IBW/kg (Calculated) : 63.8  Vital Signs: Temp: 97.5 F (36.4 C) (08/09 0304) Temp Source: Oral (08/09 0304) BP: 94/73 mmHg (08/09 0304) Pulse Rate: 64 (08/09 0304)  Labs:  Recent Labs  02/25/15 1311 02/25/15 1636  HGB 11.8*  --   HCT 35.6*  --   PLT 141*  --   LABPROT  --  24.5*  INR  --  2.23*  CREATININE 1.33*  --     Estimated Creatinine Clearance: 58.7 mL/min (by C-G formula based on Cr of 1.33).   Medical History: Past Medical History  Diagnosis Date  . Cardiomyopathy     EF55% 11/14<<35%   . Substance abuse     alcohol  . Hypertension   . Atrial fibrillation /flutter     hx of flutter ablation 2/12  . Shortness of breath     "all the time lately" (12/14/2012)  . GERD (gastroesophageal reflux disease)   . Hepatitis     "think I had that once; a long time ago; from dirty needles I think" (12/14/2012)  . 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 Leukocytosis, unspecified 06/12/2013  . 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 Obesity   . Syncope   . Cancer   . CHF (congestive heart failure)   . Renal insufficiency   . COPD (chronic obstructive pulmonary disease)    Assessment: 70 y/o M with altered mental status, warfarin PTA for afib/flutter, INR 2.23 on admit, Hgb 11.8, other labs reviewed.   Goal of Therapy:  INR 2-3 Monitor platelets by anticoagulation protocol: Yes   Plan:  -Warfarin 2.5 mg PO x 1 at 1800 -Daily PT/INR, adjust dose as needed -Monitor for bleeding  Narda Bonds 02/26/2015,3:36 AM

## 2015-02-26 NOTE — H&P (Signed)
Date: 02/26/2015               Patient Name:  [name] MRN: [id number]  DOB: Nov 27, 1945 Age / Sex: 69 y.o., male   PCP: Jule Ser, DO         Medical Service: Internal Medicine Teaching Service         Attending Physician: Dr. Annia Belt, MD    First Contact: Dr. Tiburcio Pea Pager: [phone]  Second Contact: Dr. Heber Dayton Pager: [phone]       After Hours (After 5p/  First Contact Pager: [phone]  weekends / holidays): Second Contact Pager: [phone]   Chief Complaint: Accidental drug overdose, altered mental status  History of Present Illness: [name] is a 69 y.o. 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  Patient was originally seen at [facility] ED for suspected opiate overdose where he presented with altered mental status after snorting an unknown substance.  In talking to the patient, he snorted what he thought to be heroin that he had purchased off the street, but, ultimately, he is unsure of what he took.  He was given Narcan 0.$RemoveBefore'4mg'[id number]$  and did awake with that administration.  At that time, per ED notes, patient was alert and oriented x 3, vital signs stable, and no respiratory distress.  He had a negative head CT, resolved AMS, and planned for discharge.  However, after this plan, patients girlfriend, [name], reported that he was not at his baseline level of alertness and had remained confused and also noted tremulous, which is atypical for this patient.  Patient did not require subsequent doses of Narcan and airway remained stable.  Patient used to be on methadone treatment and this was discontinued on 02/07/15.  He has been without methadone since that time and been supplementing with  heroin and fentanyl purchased off the streets.  On exam, patient is alert & oriented x3, somnolent but arousable.  There is nobody else in the room to help provide the history so much of HPI obtained from ED notes and phone conversation later had with patients girlfriend.  Patient denies history of alcohol withdrawal, and states that he has not injected himself with drugs for 2 years and that he only snorts them.  Meds: Current Facility-Administered Medications  Medication Dose Route Frequency Provider Last Rate Last Dose  . albuterol (PROVENTIL HFA;VENTOLIN HFA) 108 (90 BASE) MCG/ACT inhaler 1-2 puff  1-2 puff Inhalation Daily PRN Etta Quill, DO      . amiodarone (PACERONE) tablet 200 mg  200 mg Oral Daily Etta Quill, DO      . Budesonide   Inhalation Daily Etta Quill, DO      . carvedilol (COREG) tablet 12.5 mg  12.5 mg Oral BID WC Etta Quill, DO      . furosemide (LASIX) tablet 40 mg  40 mg Oral Daily Etta Quill, DO      . hydroxyurea (HYDREA) capsule 1,500 mg  1,500 mg Oral Daily Etta Quill, DO      . tiotropium [facility]) inhalation capsule 18 mcg  18 mcg Inhalation Daily Etta Quill, DO      . warfarin (COUMADIN) tablet 2.5 mg  2.5 mg Oral Daily Etta Quill, DO  Current Outpatient Prescriptions  Medication Sig Dispense Refill  . albuterol (PROAIR HFA) 108 (90 BASE) MCG/ACT inhaler Inhale 1-2 puffs into the lungs daily as needed for wheezing or shortness of breath. 18 g 5  . amiodarone (PACERONE) 200 MG tablet Take 1 tablet (200 mg total) by mouth daily. 30 tablet 6  . Budesonide (PULMICORT FLEXHALER) 90 MCG/ACT inhaler Inhale 1 puff into the lungs daily.     . carvedilol (COREG) 12.5 MG tablet Take 12.5 mg by mouth 2 (two) times daily with a meal.     . furosemide (LASIX) 40 MG tablet Take 1 tablet (40 mg total) by mouth daily. 30 tablet 3  . hydroxyurea (HYDREA) 500 MG capsule Take 2 capsules (1,000 mg total) by mouth daily. May take with food to  minimize GI side effects. (Patient taking differently: Take 1,500 mg by mouth daily. May take with food to minimize GI side effects.) 90 capsule 3  . methadone (DOLOPHINE) 10 MG/5ML solution Take 46 mg by mouth every morning.     . tiotropium (SPIRIVA HANDIHALER) 18 MCG inhalation capsule Place 1 capsule (18 mcg total) into inhaler and inhale daily. 30 capsule 12  . warfarin (COUMADIN) 5 MG tablet Take 0.5 tablets (2.5 mg total) by mouth daily. 30 tablet 1    Allergies: Allergies as of 02/25/2015 - Review Complete 02/25/2015  Allergen Reaction Noted  . Codeine Rash 09/30/2010   Past Medical History  Diagnosis Date  . Cardiomyopathy     EF55% 11/14<<35%   . Substance abuse     alcohol  . Hypertension   . Atrial fibrillation /flutter     hx of flutter ablation 2/12  . Shortness of breath     "all the time lately" (12/14/2012)  . GERD (gastroesophageal reflux disease)   . Hepatitis     "think I had that once; a long time ago; from dirty needles I think" (12/14/2012)  . 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 Leukocytosis, unspecified 06/12/2013  . 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 Obesity   . Syncope   . Cancer   . CHF (congestive heart failure)   . Renal insufficiency   . COPD (chronic obstructive pulmonary disease)    Past Surgical History  Procedure Laterality Date  . Hemorrhoid surgery  1970's  . Cardiac electrophysiology mapping and ablation  08/2010    [name] 09/07/2010 (12/14/2012)  . Loop recorder implant  08-22-2013    MDT LinQ implanted by Dr Caryl Comes for syncope  . Loop recorder implant N/A 08/23/2013    Procedure: LOOP RECORDER IMPLANT;  Surgeon: Deboraha Sprang, MD;  Location[facility] C[facility] LAB;  Service: Cardiovascular;  Laterality: N/A;  . Colonoscopy      15-20 years ago had colon in [place]   Family History  Problem Relation Age of Onset  . Diabetes Mother   . Hypertension Mother   . Cirrhosis Father   . Alcohol abuse Father   . Colon cancer Neg Hx   . Rectal cancer  Neg Hx   . Stomach cancer Neg Hx    History   Social History  . Marital Status: Single    Spouse Name: N/A  . Number of Children: N/A  . Years of Education: N/A   Occupational History  . Retired    Social History Main Topics  . Smoking status: Current Every Day Smoker -- 0.20 packs/day for 40 years    Types: Cigarettes  . Smokeless tobacco: Never Used     Comment: patient smoked 1-2  cigs x 50 years, now smoking approx 4-5 cigs per day x 1 year  . Alcohol Use: 0.0 oz/week    0 Standard drinks or equivalent per week  . Drug Use: Yes    Special: "Crack" cocaine, Cocaine, Heroin     Comment: 03/20/2013 "been clean for awhile"; endorses using heroin "all my life" up until about a year ago  . Sexual Activity: Not Currently   Other Topics Concern  . Not on file   Social History Narrative   Moved here from [place]. Lives with common law wife and grandchildren. He is retired from "general labor."    Review of Systems General: Denies fever, chills. Respiratory: Denies SOB, cough, and wheezing.   Cardiovascular: Denies chest pain and palpitations.  Gastrointestinal: Denies nausea, vomiting, abdominal pain, and diarrhea. ROS limited due to patients current state of health.  Physical Exam: Blood pressure 100/70, pulse 72, temperature 98.1 F (36.7 C), temperature source Oral, resp. rate 16, SpO2 95 %. Physical Exam: General: Somnolent, arousable to verbal stimuli, able to answer questions Neck: No JVD, no hepatojugular reflex Lungs: Clear to ascultation bilaterally, normal work of respiration, no rales, wheezes or rhonchi. Heart: RRR, no murmurs, gallops, or rubs Abdomen: Distended, non-tender,  BS + Extremities: No cyanosis, clubbing, or edema Neurologic: Alert & oriented X3, cranial nerves II-XII intact, strength grossly intact, sensation intact to light touch, no pronator drift.  Patient does exhibit asterixis on exam  Lab results: Basic Metabolic Panel:  Recent Labs   02/25/15 1311  NA 140  K 3.6  CL 103  CO2 29  GLUCOSE 114*  BUN 24*  CREATININE 1.33*  CALCIUM 8.5*   Liver Function Tests:  Recent Labs  02/25/15 1311  AST 22  ALT 18  ALKPHOS 75  BILITOT 0.7  PROT 6.8  ALBUMIN 3.1*    Recent Labs  02/25/15 1311  WBC 4.2  HGB 11.8*  HCT 35.6*  MCV 120.7*  PLT 141*   Coagulation:  Recent Labs  02/25/15 1636  LABPROT 24.5*  INR 2.23*   Urine Drug Screen: Drugs of Abuse     Component Value Date/Time   LABOPIA POSITIVE* 02/25/2015 1634   LABOPIA NEGATIVE 06/05/2014 0101   COCAINSCRNUR NONE DETECTED 02/25/2015 1634   COCAINSCRNUR NEGATIVE 06/05/2014 0101   LABBENZ NONE DETECTED 02/25/2015 1634   LABBENZ NEGATIVE 06/05/2014 0101   AMPHETMU NONE DETECTED 02/25/2015 1634   AMPHETMU NEGATIVE 06/05/2014 0101   THCU NONE DETECTED 02/25/2015 1634   LABBARB NONE DETECTED 02/25/2015 1634    Alcohol Level:  Recent Labs  02/25/15 1313  ETH <5    Imaging results:  Ct Head Wo Contrast  02/25/2015   CLINICAL DATA:  Altered mental status. Pt's girlfriend states that at 1157 today she heard a loud thump and found pt laying on the floor with generalized cyanosis.  EXAM: CT HEAD WITHOUT CONTRAST  TECHNIQUE: Contiguous axial images were obtained from the base of the skull through the vertex without intravenous contrast.  COMPARISON:  06/06/2014  FINDINGS: There is no evidence of mass effect, midline shift, or extra-axial fluid collections. There is no evidence of a space-occupying lesion or intracranial hemorrhage. There is no evidence of a cortical-based area of acute infarction. There is generalized cerebral atrophy. There is periventricular white matter low attenuation likely secondary to microangiopathy.  The ventricles and sulci are appropriate for the patient's age. The basal cisterns are patent.  Visualized portions of the orbits are unremarkable. There is a small air-fluid level in  the left maxillary sinus. There is bilateral ethmoid  sinus and right maxillary sinus mild mucosal thickening. Cerebrovascular atherosclerotic calcifications are noted.  The osseous structures are unremarkable.  IMPRESSION: 1. No acute intracranial pathology. 2. Chronic microvascular disease and cerebral atrophy.   Electronically Signed   By: Kathreen Devoid   On: 02/25/2015 17:08    Other results: EKG: sinus rhythm. Borderline T-wave abnormalities, borderline prolonged QT interval.  Assessment & Plan by Problem: Principal Problem:   Accidental drug overdose Active Problems:   Chronic systolic heart failure   Chronic anticoagulation   Narcotic abuse   Confusion  69 y.o. male with a past medical history of substance abuse, COPD, HFpEF (ECHO 05/2014 with EF 60-65%), hypertension, atrial fibrillation (on amiodarone and warfarin), and polycythemia vera is being admitted for altered mental status.  Patient was originally seen at [facility] ED for suspected opiate overdose where he presented with altered mental status after either injecting or snorting an unknown medication  Altered mental status -secondary to ingestion of unknown substance.  Patient thought he was ingesting heroin, but, ultimately, is unsure of what he had.  On exam, he is alert & oriented x 3, somnolent but arousable to voice, CN II-XII intact, strength 5/5 in upper and lower extremities, no pronator drift, asterixis noted on exam.  CT head at [facility] ED was negative for acute pathology with chronic microvascular disease and cerebral atrophy.  Normal electrolytes on BMP, CBC with normal WBC, acetaminophen level normal, salicylate level normal, ethanol normal, UDS negative for all except opiates -NPO until patient is a little more awake -neuro checks q4h -check BMP, CBC in AM  Substance abuse -history of alcohol and heroin abuse.  According to patients girlfriend, he has been a patient at [facility] for a while, but left town on 7/21 without notifying the clinic.  As a result,  they would not provide him with anymore methadone.  Since that time, he has been trying to get reinstated but has been supplementing with heroin and fentanyl purchased off the streets.  According to girlfriend, patient starting drinking alcohol again at this point in time.  States he has not injected himself for a couple years and that he snorts his drugs.   -recommend contacting clinic at [phone] to determine how much methadone patient should be on -CIWA -thiamine 100mg  daily, folic acid 1mg  daily -Ativan 1mg  prn -social work consult  AKI -Creatine slightly elevated at 1.33 -gentle rehydration at 75cc/hr for 10 hours -monitor Cr and follow electrolytes with BMP -hold Lasix for now  COPD, GOLD III -stable.  Last PFT was Nov 2015.  FEV1/FVC was .58 with FEV1 45% of predicted -Proventil nebulizer daily prn, Pulmicort nebulizer bid, Spiriva daily -O2 prn  Atrial fibrillation -patient on lifetime coumadin and amiodarone s/p ablation and ILR due to syncope -INR 2.23, PT 24.5 -trend PT/INR -Continue amiodarone 200mg  daily -Continue warfarin dosing per pharmacy -telemetry  Hypertension -on Coreg at home 12.5mg  PO bid.  Current BP 100/70 -Coreg 12.5mg  PO bid.  Will hold this for now  CHF -last ECHO in Nov 2015 with EF of 60-65% -stable, holding Lasix  -I/O's -daily weights  Polycythemia vera -Patient referred to Dr Alvy Bimler (heme/onc) for high hemoglobin in Nov 2014 where blood test confirmed low erythropoietin level along with detectable JAK2 mutation, confirmed myeloproliferative disorder. Bone marrow biopsy on 06/26/13 was non-diagnostic.  On 07/31/13, patient started on 1 tablet hydroxyurea daily.  08/15/13 - increased to 2 tablets hydroxyurea due to  high hemoglobin and hematocrit.  09/05/13 - increased to 3 tablets daily. 01/23/14 - dose reduced to 2 tablets hydroxyurea daily due to anemia -per Dr. Calton Dach note on 12/21/2014, patient has tolerated dose of hydroxyurea 2 tablets daily  well and to be followed up in four months from that office visit on 6/3 -patient continues to smoke -continue hydroxyurea 1000mg  PO daily  Thrombocytopenia -thrombocytopenia likely due to recent polycythemia vera treatment and possible side effect of amiodarone.   -patient denies any recent history bleeding such as epistaxis, hematuria, or hematochezia -asymptomatic from low platelets  Diet: NPO  DVT PPX: warfarin  Code: Full   Dispo: Disposition is deferred at this time, awaiting improvement of current medical problems. Anticipated discharge in approximately 1-2 day(s).   The patient does have a current PCP Jule Ser, DO) and does need an [facility] hospital follow-up appointment after discharge.  The patient does not have transportation limitations that hinder transportation to clinic appointments.  Signed: Jule Ser, DO 02/26/2015, 2:01 AM

## 2015-02-26 NOTE — Care Management Note (Signed)
Case Management Note  Patient Details  Name: Koury Roddy MRN: [id number] Date of Birth: May 31, 1946  Subjective/Objective:      Patient for dc today, no needs.              Action/Plan:   Expected Discharge Date:                  Expected Discharge Plan:  Home/Self Care  In-House Referral:     Discharge planning Services  CM Consult  Post Acute Care Choice:    Choice offered to:     DME Arranged:    DME Agency:     HH Arranged:    [place] Agency:     Status of Service:  Completed, signed off  Medicare Important Message Given:    Date Medicare IM Given:    Medicare IM give by:    Date Additional Medicare IM Given:    Additional Medicare Important Message give by:     If discussed at [place] of Stay Meetings, dates discussed:    Additional Comments:  Zenon Mayo, RN 02/26/2015, 11:45 AM

## 2015-02-26 NOTE — ED Notes (Signed)
[facility] called for report; report given

## 2015-02-26 NOTE — Progress Notes (Signed)
Subjective: 69 y.o. male with a past medical history of substance abuse, COPD, HFpEF (ECHO 05/2014 with EF 60-65%), hypertension, atrial fibrillation (on amiodarone and warfarin), and polycythemia vera is being admitted for altered mental status. Patient was originally seen at [facility] ED for suspected opiate overdose where he presented with altered mental status after either injecting or snorting an unknown medication  No acute events overnight- pt slept well  CIWA score 0  Called his [facility] at [phone] and they are willing to see the patient this Thursday at 9.30 AM and appointment was made. They said he was taking 50 mg methadone daily, but had stopped coming recently and so was discharged   Objective: Vital signs in last 24 hours: Filed Vitals:   02/25/15 2052 02/26/15 0127 02/26/15 0304 02/26/15 0839  BP: 99/66 100/70 94/73   Pulse: 63 72 64   Temp:   97.5 F (36.4 C)   TempSrc:   Oral   Resp: $Remo'12 16 18   'UNabC$ Height:   '5\' 6"'$  (1.676 m)   Weight:   213 lb 10 oz (96.9 kg)   SpO2: 100% 95% 96% 97%   Weight change:   Intake/Output Summary (Last 24 hours) at 02/26/15 1104 Last data filed at 02/26/15 0900  Gross per 24 hour  Intake  212.5 ml  Output    200 ml  Net   12.5 ml   General: alert & oriented ,  able to answer questions Neck: No JVD, no hepatojugular reflex Lungs: Clear to ascultation bilaterally, normal work of respiration, no rales, wheezes or rhonchi. Heart: RRR, no murmurs, gallops, or rubs Abdomen: Distended, non-tender, BS + Extremities: No cyanosis, clubbing, or edema Neurologic: Alert & oriented X3, cranial nerves II-XII intact, strength grossly intact, sensation intact to light touch, no pronator drift. Patient did not exhibit asterixis on exam  Lab Results: $RemoveBefo'@LABTEST2'iBJQDppqLey$ @ CBC Latest Ref Rng 02/26/2015 02/25/2015 12/21/2014  WBC 4.0 - 10.5 K/uL 3.8(L) 4.2 7.5  Hemoglobin 13.0 - 17.0 g/dL 11.9(L) 11.8(L) 13.2  Hematocrit 39.0 - 52.0 % 35.8(L)  35.6(L) 39.2  Platelets 150 - 400 K/uL 130(L) 141(L) 137(L)    BMP Latest Ref Rng 02/26/2015 02/25/2015 12/04/2014  Glucose 65 - 99 mg/dL 94 114(H) 89  BUN 6 - 20 mg/dL 22(H) 24(H) 19  Creatinine 0.61 - 1.24 mg/dL 1.25(H) 1.33(H) 1.08  Sodium 135 - 145 mmol/L 139 140 141  Potassium 3.5 - 5.1 mmol/L 3.5 3.6 4.1  Chloride 101 - 111 mmol/L 102 103 105  CO2 22 - 32 mmol/L $RemoveB'30 29 28  'QoBUXZAb$ Calcium 8.9 - 10.3 mg/dL 8.3(L) 8.5(L) 8.5(L)    Drugs of Abuse     Component Value Date/Time   LABOPIA POSITIVE* 02/25/2015 1634   LABOPIA NEGATIVE 06/05/2014 0101   COCAINSCRNUR NONE DETECTED 02/25/2015 1634   COCAINSCRNUR NEGATIVE 06/05/2014 0101   LABBENZ NONE DETECTED 02/25/2015 1634   LABBENZ NEGATIVE 06/05/2014 0101   AMPHETMU NONE DETECTED 02/25/2015 1634   AMPHETMU NEGATIVE 06/05/2014 0101   THCU NONE DETECTED 02/25/2015 1634   LABBARB NONE DETECTED 02/25/2015 1634     Micro Results: Recent Results (from the past 240 hour(s))  MRSA PCR Screening     Status: None   Collection Time: 02/26/15  3:31 AM  Result Value Ref Range Status   MRSA by PCR NEGATIVE NEGATIVE Final    Comment:        The GeneXpert MRSA Assay (FDA approved for NASAL specimens only), is one component of a comprehensive MRSA  colonization surveillance program. It is not intended to diagnose MRSA infection nor to guide or monitor treatment for MRSA infections.    Studies/Results: Ct Head Wo Contrast  02/25/2015   CLINICAL DATA:  Altered mental status. Pt's girlfriend states that at 1157 today she heard a loud thump and found pt laying on the floor with generalized cyanosis.  EXAM: CT HEAD WITHOUT CONTRAST  TECHNIQUE: Contiguous axial images were obtained from the base of the skull through the vertex without intravenous contrast.  COMPARISON:  06/06/2014  FINDINGS: There is no evidence of mass effect, midline shift, or extra-axial fluid collections. There is no evidence of a space-occupying lesion or intracranial hemorrhage.  There is no evidence of a cortical-based area of acute infarction. There is generalized cerebral atrophy. There is periventricular white matter low attenuation likely secondary to microangiopathy.  The ventricles and sulci are appropriate for the patient's age. The basal cisterns are patent.  Visualized portions of the orbits are unremarkable. There is a small air-fluid level in the left maxillary sinus. There is bilateral ethmoid sinus and right maxillary sinus mild mucosal thickening. Cerebrovascular atherosclerotic calcifications are noted.  The osseous structures are unremarkable.  IMPRESSION: 1. No acute intracranial pathology. 2. Chronic microvascular disease and cerebral atrophy.   Electronically Signed   By: Kathreen Devoid   On: 02/25/2015 17:08   Medications: I have reviewed the patient's current medications. Scheduled Meds: . amiodarone  200 mg Oral Daily  . budesonide  0.25 mg Nebulization BID  . folic acid  1 mg Oral Daily  . hydroxyurea  1,000 mg Oral Daily  . multivitamin with minerals  1 tablet Oral Daily  . sodium chloride  3 mL Intravenous Q12H  . thiamine  100 mg Oral Daily   Or  . thiamine  100 mg Intravenous Daily  . tiotropium  18 mcg Inhalation Daily  . warfarin  2.5 mg Oral q1800  . Warfarin - Pharmacist Dosing Inpatient   Does not apply q1800   Continuous Infusions: . sodium chloride 75 mL/hr at 02/26/15 0351   PRN Meds:.albuterol, LORazepam **OR** LORazepam Assessment/Plan: Principal Problem:   Accidental drug overdose Active Problems:   Chronic systolic heart failure   Chronic anticoagulation   Narcotic abuse   Confusion   Acute encephalopathy 69 y.o. male with a past medical history of substance abuse, COPD, HFpEF (ECHO 05/2014 with EF 60-65%), hypertension, atrial fibrillation (on amiodarone and warfarin), and polycythemia vera is being admitted for altered mental status. Patient was originally seen at [facility] ED for suspected opiate overdose where he  presented with altered mental status after either injecting or snorting an unknown medication  Altered mental status -secondary to ingestion of unknown substance. Patient thought he was ingesting heroin, but, ultimately, is unsure of what he had.  This morning On exam, he is alert & oriented x 3,  CN II-XII intact, strength 5/5 in upper and lower extremities, no pronator drift, no asterixis noted on exam.  CT head at [facility] ED was negative for acute pathology with chronic microvascular disease and cerebral atrophy.  Normal electrolytes on BMP, CBC with normal WBC, acetaminophen level normal, salicylate level normal, ethanol normal, UDS negative for all except opiates -ready to be discharged -called his pain clinic and they'll see him to re-start his methadone  Substance abuse -CIWA -thiamine $RemoveBeforeD'100mg'iGTvmMMRmISbUt$  daily, folic acid $RemoveBe'1mg'xJdpWzjPW$  daily -Ativan $RemoveBefor'1mg'HjyEpbvXugDh$  prn -social work consult  AKI -Creatine slightly elevated at 1.33 -gentle rehydration at 75cc/hr for 10 hours -monitor Cr and  follow electrolytes with BMP -hold Lasix for now  COPD, GOLD III -stable. Last PFT was Nov 2015. FEV1/FVC was .58 with FEV1 45% of predicted -Proventil nebulizer daily prn, Pulmicort nebulizer bid, Spiriva daily -O2 prn  Atrial fibrillation -patient on lifetime coumadin and amiodarone s/p ablation and ILR due to syncope -INR 2.23, PT 24.5 -trend PT/INR -Continue amiodarone 200mg  daily -Continue warfarin dosing per pharmacy -telemetry  Hypertension -on Coreg at home 12.5mg  PO bid. Current BP 100/70 -Coreg 12.5mg  PO bid. Will hold this for now  CHF -last ECHO in Nov 2015 with EF of 60-65% -stable, holding Lasix  -I/O's -daily weights  Polycythemia vera -Patient referred to Dr Alvy Bimler (heme/onc) for high hemoglobin in Nov 2014 where blood test confirmed low erythropoietin level along with detectable JAK2 mutation, confirmed myeloproliferative disorder. Bone marrow biopsy on 06/26/13 was non-diagnostic. On  07/31/13, patient started on 1 tablet hydroxyurea daily. 08/15/13 - increased to 2 tablets hydroxyurea due to high hemoglobin and hematocrit. 09/05/13 - increased to 3 tablets daily. 01/23/14 - dose reduced to 2 tablets hydroxyurea daily due to anemia -per Dr. Calton Dach note on 12/21/2014, patient has tolerated dose of hydroxyurea 2 tablets daily well and to be followed up in four months from that office visit on 6/3 -patient continues to smoke -continue hydroxyurea 1000mg  PO daily  Thrombocytopenia -thrombocytopenia likely due to recent polycythemia vera treatment and possible side effect of amiodarone.  -patient denies any recent history bleeding such as epistaxis, hematuria, or hematochezia -asymptomatic from low platelets  Diet: NPO  DVT PPX: warfarin  Code: Full  Dispo: Disposition is deferred at this time, awaiting improvement of current medical problems.  Anticipated discharge in approximately 0 day(s).   The patient does have a current PCP Jule Ser, DO) and does need an [facility] hospital follow-up appointment after discharge.  The patient does not have transportation limitations that hinder transportation to clinic appointments.  .Services Needed at time of discharge: Y = Yes, Blank = No PT:   OT:   RN:   Equipment:   Other:     LOS: 0 days   Burgess Estelle, MD 02/26/2015, 11:04 AM

## 2015-02-26 NOTE — Progress Notes (Signed)
Unable to complete admission history bec.pt.is sleepy at this time.Will endorse to morning shift nurse.

## 2015-02-26 NOTE — Discharge Instructions (Signed)
Please follow up to [facility] on Thursday at 9:30 AM  With Dr. Elbert Ewings regarding possibly restarting your methadone [phone]  Altered Mental Status Altered mental status most often refers to an abnormal change in your responsiveness and awareness. It can affect your speech, thought, mobility, memory, attention span, or alertness. It can range from slight confusion to complete unresponsiveness (coma). Altered mental status can be a sign of a serious underlying medical condition. Rapid evaluation and medical treatment is necessary for patients having an altered mental status. CAUSES   Low blood sugar (hypoglycemia) or diabetes.  Severe loss of body fluids (dehydration) or a body salt (electrolyte) imbalance.  A stroke or other neurologic problem, such as dementia or delirium.  A head injury or tumor.  A drug or alcohol overdose.  Exposure to toxins or poisons.  Depression, anxiety, and stress.  A low oxygen level (hypoxia).  An infection.  Blood loss.  Twitching or shaking (seizure).  Heart problems, such as heart attack or heart rhythm problems (arrhythmias).  A body temperature that is too low or too high (hypothermia or hyperthermia). DIAGNOSIS  A diagnosis is based on your history, symptoms, physical and neurologic examinations, and diagnostic tests. Diagnostic tests may include:  Measurement of your blood pressure, pulse, breathing, and oxygen levels (vital signs).  Blood tests.  Urine tests.  X-ray exams.  A computerized magnetic scan (magnetic resonance imaging, MRI).  A computerized X-ray scan (computed tomography, CT scan). TREATMENT  Treatment will depend on the cause. Treatment may include:  Management of an underlying medical or mental health condition.  Critical care or support in the hospital. [place]   Only take over-the-counter or prescription medicines for pain, discomfort, or fever as directed by your  caregiver.  Manage underlying conditions as directed by your caregiver.  Eat a healthy, well-balanced diet to maintain strength.  Join a support group or prevention program to cope with the condition or trauma that caused the altered mental status. Ask your caregiver to help choose a program that works for you.  Follow up with your caregiver for further examination, therapy, or testing as directed. SEEK MEDICAL CARE IF:   You feel unwell or have chills.  You or your family notice a change in your behavior or your alertness.  You have trouble following your caregiver's treatment plan.  You have questions or concerns. SEEK IMMEDIATE MEDICAL CARE IF:   You have a rapid heartbeat or have chest pain.  You have difficulty breathing.  You have a fever.  You have a headache with a stiff neck.  You cough up blood.  You have blood in your urine or stool.  You have severe agitation or confusion. MAKE SURE YOU:   Understand these instructions.  Will watch your condition.  Will get help right away if you are not doing well or get worse. Document Released: 12/24/2009 Document Revised: 09/28/2011 Document Reviewed: 12/24/2009 [facility] Patient Information 2015 [place], [place]. This information is not intended to replace advice given to you by your health care provider. Make sure you discuss any questions you have with your health care provider. Narcotic Overdose A narcotic overdose is the misuse or overuse of a narcotic drug. A narcotic overdose can make you pass out and stop breathing. If you are not treated right away, this can cause permanent brain damage or stop your heart. Medicine may be given to reverse the effects of an overdose. If so, this medicine may bring on withdrawal symptoms. The  symptoms may be abdominal cramps, throwing up (vomiting), sweating, chills, and nervousness. Injecting narcotics can cause more problems than just an overdose. AIDS, hepatitis, and other very  serious infections are transmitted by sharing needles and syringes. If you decide to quit using, there are medicines which can help you through the withdrawal period. Trying to quit all at once on your own can be uncomfortable, but not life-threatening. Call your caregiver, Narcotics Anonymous, or any drug and alcohol treatment program for further help.  Document Released: 08/13/2004 Document Revised: 09/28/2011 Document Reviewed: 06/07/2009 [facility] Patient Information 2015 [place], [place]. This information is not intended to replace advice given to you by your health care provider. Make sure you discuss any questions you have with your health care provider.  Emergency Department Resource Guide 1) Find a Doctor and Pay Out of Pocket Although you won't have to find out who is covered by your insurance plan, it is a good idea to ask around and get recommendations. You will then need to call the office and see if the doctor you have chosen will accept you as a new patient and what types of options they offer for patients who are self-pay. Some doctors offer discounts or will set up payment plans for their patients who do not have insurance, but you will need to ask so you aren't surprised when you get to your appointment.  2) Contact Your Local Health Department Not all health departments have doctors that can see patients for sick visits, but many do, so it is worth a call to see if yours does. If you don't know where your local health department is, you can check in your phone book. The CDC also has a tool to help you locate your state's health department, and many state websites also have listings of all of their local health departments.  3) Find a [facility] If your illness is not likely to be very severe or complicated, you may want to try a walk in clinic. These are popping up all over the country in pharmacies, drugstores, and shopping centers. They're usually staffed by nurse practitioners or  physician assistants that have been trained to treat common illnesses and complaints. They're usually fairly quick and inexpensive. However, if you have serious medical issues or chronic medical problems, these are probably not your best option.  No Primary Care Doctor: - Call Health Connect at  [phone] - they can help you locate a primary care doctor that  accepts your insurance, provides certain services, etc. - Physician Referral Service- [phone]  Chronic Pain Problems: Organization         Address  Phone   Notes  [facility]  [phone] Patients need to be referred by their primary care doctor.   Medication Assistance: Organization         Address  Phone   Notes  [facility] Medication [facility] [address] [place], [place] [address] [phone] --Must be a resident of [facility] -- Must have NO insurance coverage whatsoever (no Medicaid/ Medicare, etc.) -- The pt. MUST have a primary care doctor that directs their care regularly and follows them in the community   MedAssist  [phone]   [employer]  [phone]    Agencies that provide inexpensive medical care: Organization         Address  Phone   Notes  [place]  [phone]   [facility]    (  [phone]   [facility] [place], [place] [address] [phone]   [place] [address] [place] [phone]   Planned Parenthood    [phone]   [facility]    [phone]   [place] and [place] [address], [place] Phone:  [phone], Fax:  [phone] Hours of Operation:  9 am - 6 pm, M-F.  Also accepts Medicaid/Medicare and self-pay.  [facility] for [place], [address], [place] Phone: [phone], Fax: [phone]. Hours of Operation:  8:30 am - 5:30 pm, M-F.   Also accepts Medicaid and self-pay.  [facility] [address], [place] Phone: [phone]   [place], [place], [place] [phone], Ext. [phone] Mondays & Thursdays: 7-9 AM.  First 15 patients are seen on a first come, first serve basis.    [place] Providers:  Organization         Address  Phone   Notes  [facility] [address], [place] [phone] Also accepts self-pay patients.  [facility] [address], [place]  [phone]   [place], [address], [place] [phone]   [facility] [address], [place] [phone]   Lucianne Lei [address], [place]   [phone] Only accepts [place] Access [place] patients after they have their name applied to their card.   Self-Pay (no insurance) in [facility]:  Organization         Address  Phone   Notes  Sickle Cell Patients, [facility] [place] [phone]   [facility] [place] [phone]   [facility] [place]  [place], [place],  [phone]   [facility]/Dr. Osei-Bonsu  [address] [place] or [address], [place] [phone] Phone number for both [place] and [place] locations is the same.  [facility] [address], [place] [phone]   [facility] [address] [place] or [address] [phone]   [facility] [address], [place] [phone], phone; [phone], fax Sees patients 1st and 3rd Saturday of every month.  Must not qualify for public or private insurance (i.e. Medicaid, Medicare, [place] Health Choice, Veterans'  Benefits)  Household income should be no more than 200% of the poverty level The clinic cannot treat you if you are pregnant or think you are pregnant  Sexually transmitted diseases are not treated at the clinic.    Dental Care: Organization         Address  Phone  Notes  [facility] Department of [facility] [phone] Accepts children up to age 71 who are enrolled in [place] or [place]; pregnant women with a Medicaid card; and children who have applied for Medicaid or  Health Choice, but were declined, whose parents can pay a reduced fee at time of service.  [facility] Department of [facility]  [address], [place] [phone] Accepts children up  to age 50 who are enrolled in Medicaid or [place] Health Choice; pregnant women with a Medicaid card; and children who have applied for Medicaid or [place] Health Choice, but were declined, whose parents can pay a reduced fee at time of service.  [place] Adult Dental Access PROGRAM  [place] [phone] Patients are seen by appointment only. Walk-ins are not accepted. [place] will see patients 73 years of age and older. Monday - Tuesday (8am-5pm) Most Wednesdays (8:30-5pm) $30 per visit, cash only  [place] Adult Dental Access PROGRAM  [address], [facility] [phone] Patients are seen by appointment only. Walk-ins are not accepted. [place] will see patients 24 years of age and older. One Wednesday Evening (Monthly: Volunteer Based).  $30 per visit, cash only  [place]  [phone] for adults; Children under age 54, call Graduate Pediatric Dentistry at [phone]. Children aged 41-14, please call [phone] to request a pediatric application.  Dental services are provided in all areas of dental care including fillings, crowns and bridges, complete and partial  dentures, implants, gum treatment, root canals, and extractions. Preventive care is also provided. Treatment is provided to both adults and children. Patients are selected via a lottery and there is often a waiting list.   [facility] [address] [place]  [phone] [email or web address]   [facility] [address] [place], [place] [phone], Ext. [phone] Second and Fourth Thursday of each month, opens at 6:30 AM; Clinic ends at 9 AM.  Patients are seen on a first-come first-served basis, and a limited number are seen during each clinic.   [facility]  [address] Hillard Danker [place], [place] [phone]   Eligibility Requirements You must have lived in [place], [place], or [place] counties for at least the last three months.   You cannot be eligible for state or federal sponsored [employer], including [employer], [place], or [employer].   You generally cannot be eligible for healthcare insurance through your employer.    How to apply: Eligibility screenings are held every Tuesday and Wednesday afternoon from 1:00 pm until 4:00 pm. You do not need an appointment for the interview!  [facility] [address], [place], [place]   [facility] [facility]  [place] Department  [place]  [phone]    Behavioral Health Resources in the Community: Intensive Outpatient Programs Organization         Address  Phone  Notes  [place] [place], [place] [phone]   [facility] [address] [place], [place]   ADS: Alcohol & Drug Svcs [address] [place], [place]   [place] [address]  [place], [place] or [phone]   Substance Abuse Resources Organization          Address  Phone  Notes  Alcohol and Drug Services  [phone]   [place]  [phone]   The [place]   Chinita Pester  [phone]   Residential & Outpatient Substance Abuse Program  [phone]   Psychological Services Organization         Address  Phone  Notes  [place]  [place]  [place]   [place]  [address] [place] or [phone]    Mobile Crisis Teams Organization         Address  Phone  Notes  [facility], Mobile Crisis Care Unit  [phone]   [facility]otherapeutic Services  [address] [place], [place]   [place] [address], [place] [phone]    Self-Help/Support Groups Organization         Address  Phone             Notes  [place]. of [place] - variety of support groups  [place] Call for more information  Narcotics Anonymous (NA), [facility] [address], [employer] [place]  2 meetings at this location   Special educational needs teacher         Address  Phone  Notes  [facility]idential [facility] [place],    [place]  [phone]   [facility]  [address] [place] [phone], [place], [place]   [place], [place] [phone] Admissions: 8am-3pm M-F  Incentives Substance [place] [address],    [place], [place] [phone]   [facility] [address] [place], [place], [place]   [facility] [address],  [place], [place]   [facility] Programs - Intensive Outpatient [address], [place], [place]   [facility] [address]  [place], [place] [phone] or [phone]   [facility]idential Treatment Services (RTS) [address], [place], [place] Accepts Medicaid  [place] [address],  [place] [phone] Substance Abuse/Addiction Treatment   [facility] Organization         Address  Phone  Notes  [facility]  [phone]   Domenic Schwab, PhD [address] Arlis [place], [place]   [phone] or [phone]   [place], [place] [phone]   [facility] [address], [place], [place] [phone] Insurance/Medicaid/sponsorship through [facility] and Families [address], [address] [place]                                    [place], [place] [phone] [place] [address], [place] [phone]    Dr. Adele Schilder  [phone]   [facility] Dept. 1) [address] [place] 2) [place] 3)  [place] [address], [place] [phone]  [phone]   [place] [phone] or [phone] (After Hours)

## 2015-02-26 NOTE — ED Notes (Signed)
Pt given Coke after he asked for something to drink.  Pt more alert and oriented and ambulating on own.

## 2015-03-01 ENCOUNTER — Ambulatory Visit: Payer: Self-pay | Admitting: Student-PharmD

## 2015-03-01 ENCOUNTER — Ambulatory Visit: Payer: Medicare Other

## 2015-03-11 ENCOUNTER — Other Ambulatory Visit: Payer: Self-pay | Admitting: Hematology and Oncology

## 2015-03-12 ENCOUNTER — Ambulatory Visit: Payer: Medicare Other | Admitting: Internal Medicine

## 2015-03-21 ENCOUNTER — Encounter: Payer: Self-pay | Admitting: Internal Medicine

## 2015-03-28 ENCOUNTER — Ambulatory Visit (INDEPENDENT_AMBULATORY_CARE_PROVIDER_SITE_OTHER): Payer: Medicare Other | Admitting: *Deleted

## 2015-03-28 DIAGNOSIS — R55 Syncope and collapse: Secondary | ICD-10-CM

## 2015-03-28 NOTE — Progress Notes (Signed)
Loop recorder 

## 2015-04-01 ENCOUNTER — Ambulatory Visit (INDEPENDENT_AMBULATORY_CARE_PROVIDER_SITE_OTHER): Payer: Medicare Other | Admitting: Pharmacist

## 2015-04-01 DIAGNOSIS — I4891 Unspecified atrial fibrillation: Secondary | ICD-10-CM

## 2015-04-01 DIAGNOSIS — Z7901 Long term (current) use of anticoagulants: Secondary | ICD-10-CM

## 2015-04-01 LAB — POCT INR: INR: 2.1

## 2015-04-01 NOTE — Progress Notes (Signed)
Patient instructed to take medications as defined in the Anti-coagulation Track section of this encounter.  Patient instructed to take today's dose.  Patient verbalized understanding of these instructions.    

## 2015-04-01 NOTE — Patient Instructions (Signed)
Patient instructed to take medications as defined in the Anti-coagulation Track section of this encounter.  Patient instructed to take today's dose.  Patient verbalized understanding of these instructions.    

## 2015-04-01 NOTE — Progress Notes (Signed)
Anti-Coagulation Progress Note  [name] is a 69 y.o. male who is currently on an anti-coagulation regimen.    RECENT RESULTS: Recent results are below, the most recent result is correlated with a dose of 12.5 mg. per week: Lab Results  Component Value Date   INR 2.10 04/01/2015   INR 2.23* 02/25/2015   INR 2.90 01/28/2015    ANTI-COAG DOSE: Anticoagulation Dose Instructions as of 04/01/2015      [name] Tue Wed Thu Fri Sat   New Dose 2.5 mg 2.5 mg 2.5 mg 2.5 mg 2.5 mg 0 mg 2.5 mg       ANTICOAG SUMMARY: Anticoagulation Episode Summary    Current INR goal 2.0-3.0  Next INR check 04/29/2015  INR from last check 2.10 (04/01/2015)  Weekly max dose   Target end date Indefinite  [facility]  Preferred lab   Send INR reminders to    Indications  Atrial fibrillation [I48.91]        Comments         ANTICOAG TODAY: Anticoagulation Summary as of 04/01/2015    INR goal 2.0-3.0  Selected INR 2.10 (04/01/2015)  Next INR check 04/29/2015  Target end date Indefinite   Indications  Atrial fibrillation [I48.91]      Anticoagulation Episode Summary    [facility]   Preferred lab    Send INR reminders to    Comments       PATIENT INSTRUCTIONS: Patient Instructions  Patient instructed to take medications as defined in the Anti-coagulation Track section of this encounter.  Patient instructed to take today's dose.  Patient verbalized understanding of these instructions.       FOLLOW-UP Return in 4 weeks (on 04/29/2015) for Follow up INR at 0915h.  [name], III Pharm.D., CACP

## 2015-04-22 ENCOUNTER — Other Ambulatory Visit: Payer: Medicare Other

## 2015-04-23 ENCOUNTER — Encounter: Payer: Self-pay | Admitting: Hematology and Oncology

## 2015-04-23 ENCOUNTER — Telehealth: Payer: Self-pay | Admitting: Hematology and Oncology

## 2015-04-23 ENCOUNTER — Ambulatory Visit (HOSPITAL_BASED_OUTPATIENT_CLINIC_OR_DEPARTMENT_OTHER): Payer: Medicare Other | Admitting: Hematology and Oncology

## 2015-04-23 ENCOUNTER — Ambulatory Visit (HOSPITAL_BASED_OUTPATIENT_CLINIC_OR_DEPARTMENT_OTHER): Payer: Medicare Other

## 2015-04-23 VITALS — BP 121/61 | Temp 98.6°F | Ht 66.0 in | Wt 211.4 lb

## 2015-04-23 DIAGNOSIS — D72819 Decreased white blood cell count, unspecified: Secondary | ICD-10-CM

## 2015-04-23 DIAGNOSIS — Z7901 Long term (current) use of anticoagulants: Secondary | ICD-10-CM | POA: Diagnosis not present

## 2015-04-23 DIAGNOSIS — D45 Polycythemia vera: Secondary | ICD-10-CM

## 2015-04-23 DIAGNOSIS — D471 Chronic myeloproliferative disease: Secondary | ICD-10-CM

## 2015-04-23 DIAGNOSIS — D6959 Other secondary thrombocytopenia: Secondary | ICD-10-CM

## 2015-04-23 DIAGNOSIS — D6481 Anemia due to antineoplastic chemotherapy: Secondary | ICD-10-CM

## 2015-04-23 DIAGNOSIS — Z720 Tobacco use: Secondary | ICD-10-CM | POA: Diagnosis not present

## 2015-04-23 DIAGNOSIS — D701 Agranulocytosis secondary to cancer chemotherapy: Secondary | ICD-10-CM

## 2015-04-23 DIAGNOSIS — T451X5A Adverse effect of antineoplastic and immunosuppressive drugs, initial encounter: Secondary | ICD-10-CM

## 2015-04-23 DIAGNOSIS — T50905A Adverse effect of unspecified drugs, medicaments and biological substances, initial encounter: Secondary | ICD-10-CM

## 2015-04-23 LAB — CBC WITH DIFFERENTIAL/PLATELET
BASO%: 0.3 % (ref 0.0–2.0)
BASOS ABS: 0 10*3/uL (ref 0.0–0.1)
EOS ABS: 0.1 10*3/uL (ref 0.0–0.5)
EOS%: 2.9 % (ref 0.0–7.0)
HEMATOCRIT: 34.1 % — AB (ref 38.4–49.9)
HGB: 11.5 g/dL — ABNORMAL LOW (ref 13.0–17.1)
LYMPH%: 32.2 % (ref 14.0–49.0)
MCH: 41.7 pg — AB (ref 27.2–33.4)
MCHC: 33.7 g/dL (ref 32.0–36.0)
MCV: 123.6 fL — ABNORMAL HIGH (ref 79.3–98.0)
MONO#: 0.5 10*3/uL (ref 0.1–0.9)
MONO%: 11.9 % (ref 0.0–14.0)
NEUT#: 2 10*3/uL (ref 1.5–6.5)
NEUT%: 52.7 % (ref 39.0–75.0)
Platelets: 136 10*3/uL — ABNORMAL LOW (ref 140–400)
RBC: 2.76 10*6/uL — ABNORMAL LOW (ref 4.20–5.82)
RDW: 13.4 % (ref 11.0–14.6)
WBC: 3.9 10*3/uL — ABNORMAL LOW (ref 4.0–10.3)
lymph#: 1.2 10*3/uL (ref 0.9–3.3)

## 2015-04-23 NOTE — Assessment & Plan Note (Signed)
This is likely due to recent treatment and possible side-effects of recent addition of amiodarone or Hydrea. The patient denies recent history of bleeding such as epistaxis, hematuria or hematochezia. He is asymptomatic from the low platelet count.

## 2015-04-23 NOTE — Telephone Encounter (Signed)
per pof to sch pt appt-gave pt copy of avs-sent pt back to lab °

## 2015-04-23 NOTE — Assessment & Plan Note (Signed)
This is likely due to recent treatment. The patient denies recent history of bleeding such as epistaxis, hematuria or hematochezia. He is asymptomatic from the anemia. I will observe for now.    

## 2015-04-23 NOTE — Assessment & Plan Note (Signed)
This is likely due to recent treatment. The patient denies recent history of fevers, cough, chills, diarrhea or dysuria. He is asymptomatic from the leukopenia. I will observe for now. I will reduce the dose of his treatment as above. 

## 2015-04-23 NOTE — Progress Notes (Signed)
[facility] OFFICE PROGRESS NOTE  Patient Care Team: Jule Ser, DO as PCP - General Heath Lark, MD as Consulting Physician (Hematology and Oncology)  SUMMARY OF ONCOLOGIC HISTORY:  This is a pleasant gentleman who is being referred here because of high hemoglobin level. In November 2014, we removed one unit of blood and order an additional workup to rule out myeloproliferative disorder. Blood test result confirmed low erythropoietin level along with detectable JAK 2 mutation, confirmed myeloproliferative disorder Unfortunately, bone marrow biopsy on 06/26/2013 was nondiagnostic On 07/31/13 the patient was started on 1 hydroxyurea a day On 08/15/2013, increased hydroxyurea to 2 tablets a day and remove one more unit of blood due to the high hemoglobin On 09/05/2013, I increased hydroxyurea to 3 tablets a day and remove one more unit of blood due to high hemoglobin and hematocrit On 01/23/2014, dose of hydroxyurea is reduced to 2 tablets a day due to anemia. On 04/23/2015, dose of hydroxyurea is adjusted to 500 mg on Mondays, Wednesdays and Fridays and to take 1000 mg for the rest of the week INTERVAL HISTORY: Please see below for problem oriented charting. He feels well Denies side effects of treatment. The patient denies any recent signs or symptoms of bleeding such as spontaneous epistaxis, hematuria or hematochezia. He stated he has been compliant taking all his medications. REVIEW OF SYSTEMS:   Constitutional: Denies fevers, chills or abnormal weight loss Eyes: Denies blurriness of vision Ears, nose, mouth, throat, and face: Denies mucositis or sore throat Respiratory: Denies cough, dyspnea or wheezes Cardiovascular: Denies palpitation, chest discomfort or lower extremity swelling Gastrointestinal:  Denies nausea, heartburn or change in bowel habits Skin: Denies abnormal skin rashes Lymphatics: Denies new lymphadenopathy or easy bruising Neurological:Denies  numbness, tingling or new weaknesses Behavioral/Psych: Mood is stable, no new changes  All other systems were reviewed with the patient and are negative.  I have reviewed the past medical history, past surgical history, social history and family history with the patient and they are unchanged from previous note.  ALLERGIES:  is allergic to codeine.  MEDICATIONS:  Current Outpatient Prescriptions  Medication Sig Dispense Refill  . albuterol (PROAIR HFA) 108 (90 BASE) MCG/ACT inhaler Inhale 1-2 puffs into the lungs daily as needed for wheezing or shortness of breath. 18 g 5  . amiodarone (PACERONE) 200 MG tablet Take 1 tablet (200 mg total) by mouth daily. 30 tablet 6  . Budesonide (PULMICORT FLEXHALER) 90 MCG/ACT inhaler Inhale 1 puff into the lungs daily.     . carvedilol (COREG) 12.5 MG tablet Take 12.5 mg by mouth 2 (two) times daily with a meal.     . folic acid (FOLVITE) 1 MG tablet Take 1 tablet (1 mg total) by mouth daily. 30 tablet 2  . furosemide (LASIX) 40 MG tablet Take 1 tablet (40 mg total) by mouth daily. 30 tablet 3  . hydroxyurea (HYDREA) 500 MG capsule Take 2 capsules (1,000 mg total) by mouth daily. May take with food to minimize GI side effects. (Patient taking differently: Take 1,500 mg by mouth daily. May take with food to minimize GI side effects.) 90 capsule 3  . hydroxyurea (HYDREA) 500 MG capsule take 3 capsules by mouth once daily with food TO MINIMIZE GI SIDE EFFECTS 90 capsule 3  . methadone (DOLOPHINE) 10 MG/5ML solution Take 46 mg by mouth every morning.     . Multiple Vitamin (MULTIVITAMIN WITH MINERALS) TABS tablet Take 1 tablet by mouth daily. 60 tablet 2  .  tiotropium (SPIRIVA HANDIHALER) 18 MCG inhalation capsule Place 1 capsule (18 mcg total) into inhaler and inhale daily. 30 capsule 12  . warfarin (COUMADIN) 5 MG tablet Take 0.5 tablets (2.5 mg total) by mouth daily. 30 tablet 1   No current facility-administered medications for this visit.    PHYSICAL  EXAMINATION: ECOG PERFORMANCE STATUS: 0 - Asymptomatic  Filed Vitals:   04/23/15 0819  BP: 121/61  Temp: 98.6 F (37 C)   Filed Weights   04/23/15 0819  Weight: 211 lb 6.4 oz (95.89 kg)    GENERAL:alert, no distress and comfortable SKIN: skin color, texture, turgor are normal, no rashes or significant lesions EYES: normal, Conjunctiva are pink and non-injected, sclera clear OROPHARYNX:no exudate, no erythema and lips, buccal mucosa, and tongue normal  NECK: supple, thyroid normal size, non-tender, without nodularity LYMPH:  no palpable lymphadenopathy in the cervical, axillary or inguinal LUNGS: clear to auscultation and percussion with normal breathing effort HEART: regular rate & rhythm and no murmurs and no lower extremity edema ABDOMEN:abdomen soft, non-tender and normal bowel sounds Musculoskeletal:no cyanosis of digits and no clubbing  NEURO: alert & oriented x 3 with fluent speech, no focal motor/sensory deficits  LABORATORY DATA:  I have reviewed the data as listed    Component Value Date/Time   NA 139 02/26/2015 0700   NA 143 06/12/2013 1125   K 3.5 02/26/2015 0700   K 3.7 06/12/2013 1125   CL 102 02/26/2015 0700   CO2 30 02/26/2015 0700   CO2 32* 06/12/2013 1125   GLUCOSE 94 02/26/2015 0700   GLUCOSE 92 06/12/2013 1125   BUN 22* 02/26/2015 0700   BUN 14.6 06/12/2013 1125   CREATININE 1.25* 02/26/2015 0700   CREATININE 1.05 08/30/2014 1628   CREATININE 0.9 06/12/2013 1125   CALCIUM 8.3* 02/26/2015 0700   CALCIUM 8.9 06/12/2013 1125   PROT 6.8 02/25/2015 1311   PROT 6.9 06/12/2013 1125   ALBUMIN 3.1* 02/25/2015 1311   ALBUMIN 3.3* 06/12/2013 1125   AST 22 02/25/2015 1311   AST 21 06/12/2013 1125   ALT 18 02/25/2015 1311   ALT 28 06/12/2013 1125   ALKPHOS 75 02/25/2015 1311   ALKPHOS 112 06/12/2013 1125   BILITOT 0.7 02/25/2015 1311   BILITOT 0.82 06/12/2013 1125   GFRNONAA 58* 02/26/2015 0700   GFRNONAA 73 08/30/2014 1628   GFRAA >60 02/26/2015 0700    GFRAA 84 08/30/2014 1628    No results found for: SPEP, UPEP  Lab Results  Component Value Date   WBC 3.9* 04/23/2015   NEUTROABS 2.0 04/23/2015   HGB 11.5* 04/23/2015   HCT 34.1* 04/23/2015   MCV 123.6* 04/23/2015   PLT 136* 04/23/2015      Chemistry      Component Value Date/Time   NA 139 02/26/2015 0700   NA 143 06/12/2013 1125   K 3.5 02/26/2015 0700   K 3.7 06/12/2013 1125   CL 102 02/26/2015 0700   CO2 30 02/26/2015 0700   CO2 32* 06/12/2013 1125   BUN 22* 02/26/2015 0700   BUN 14.6 06/12/2013 1125   CREATININE 1.25* 02/26/2015 0700   CREATININE 1.05 08/30/2014 1628   CREATININE 0.9 06/12/2013 1125      Component Value Date/Time   CALCIUM 8.3* 02/26/2015 0700   CALCIUM 8.9 06/12/2013 1125   ALKPHOS 75 02/25/2015 1311   ALKPHOS 112 06/12/2013 1125   AST 22 02/25/2015 1311   AST 21 06/12/2013 1125   ALT 18 02/25/2015 1311   ALT 28  06/12/2013 1125   BILITOT 0.7 02/25/2015 1311   BILITOT 0.82 06/12/2013 1125      ASSESSMENT & PLAN:  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 He tolerated the hydroxyurea well.  However, he is developing mild pancytopenia. I plan to reduce his hydroxyurea dose to 500 mg on Mondays, Wednesdays and Fridays and to take 1000 mg for the rest of the week. I will see him back in 4 months.    Chronic anticoagulation The patient denies any recent signs or symptoms of bleeding such as spontaneous epistaxis, hematuria or hematochezia. he will continue current medical management. I recommend close follow-up with primary care doctor for medication adjustment.   Tobacco abuse I spent some time counseling the patient the importance of tobacco cessation. He is currently not interested to quit now.      Leukopenia due to antineoplastic chemotherapy This is likely due to recent treatment. The patient denies recent history of fevers, cough, chills, diarrhea or dysuria. He is asymptomatic  from the leukopenia. I will observe for now. I will reduce the dose of his treatment as above.    Anemia due to antineoplastic chemotherapy This is likely due to recent treatment. The patient denies recent history of bleeding such as epistaxis, hematuria or hematochezia. He is asymptomatic from the anemia. I will observe for now.      Thrombocytopenia due to drugs This is likely due to recent treatment and possible side-effects of recent addition of amiodarone or Hydrea. The patient denies recent history of bleeding such as epistaxis, hematuria or hematochezia. He is asymptomatic from the low platelet count.      No orders of the defined types were placed in this encounter.   All questions were answered. The patient knows to call the clinic with any problems, questions or concerns. No barriers to learning was detected. I spent 15 minutes counseling the patient face to face. The total time spent in the appointment was 20 minutes and more than 50% was on counseling and review of test results     [facility], [place], MD 04/23/2015 8:56 AM

## 2015-04-23 NOTE — Assessment & Plan Note (Signed)
I spent some time counseling the patient the importance of tobacco cessation. He is currently not interested to quit now.  

## 2015-04-23 NOTE — Assessment & Plan Note (Signed)
The patient denies any recent signs or symptoms of bleeding such as spontaneous epistaxis, hematuria or hematochezia. he will continue current medical management. I recommend close follow-up with primary care doctor for medication adjustment. 

## 2015-04-23 NOTE — Assessment & Plan Note (Addendum)
The patient has JAK2 mutation positive myeloproliferative disorder. He tolerated the hydroxyurea well.  However, he is developing mild pancytopenia. I plan to reduce his hydroxyurea dose to 500 mg on Mondays, Wednesdays and Fridays and to take 1000 mg for the rest of the week. I will see him back in 4 months.

## 2015-04-26 LAB — CUP PACEART REMOTE DEVICE CHECK: MDC IDC SESS DTM: 20160908063704

## 2015-04-26 NOTE — Progress Notes (Signed)
Carelink summary report received. Battery status OK. Normal device function. No new symptom episodes, tachy episodes, brady, or pause episodes. 3 AF episodes, 0.1% + warfarin. Monthly summary reports and ROV w/ GT 5/17.

## 2015-04-29 ENCOUNTER — Ambulatory Visit (INDEPENDENT_AMBULATORY_CARE_PROVIDER_SITE_OTHER): Payer: Medicare Other | Admitting: *Deleted

## 2015-04-29 ENCOUNTER — Encounter: Payer: Self-pay | Admitting: Internal Medicine

## 2015-04-29 ENCOUNTER — Ambulatory Visit: Payer: Medicare Other | Admitting: Pharmacist

## 2015-04-29 ENCOUNTER — Ambulatory Visit (INDEPENDENT_AMBULATORY_CARE_PROVIDER_SITE_OTHER): Payer: Medicare Other | Admitting: Internal Medicine

## 2015-04-29 VITALS — BP 127/63 | HR 49 | Temp 98.0°F | Wt 211.2 lb

## 2015-04-29 DIAGNOSIS — R55 Syncope and collapse: Secondary | ICD-10-CM

## 2015-04-29 DIAGNOSIS — Z7901 Long term (current) use of anticoagulants: Secondary | ICD-10-CM | POA: Diagnosis not present

## 2015-04-29 DIAGNOSIS — Z720 Tobacco use: Secondary | ICD-10-CM

## 2015-04-29 DIAGNOSIS — R319 Hematuria, unspecified: Secondary | ICD-10-CM

## 2015-04-29 DIAGNOSIS — F17210 Nicotine dependence, cigarettes, uncomplicated: Secondary | ICD-10-CM | POA: Diagnosis not present

## 2015-04-29 DIAGNOSIS — I10 Essential (primary) hypertension: Secondary | ICD-10-CM

## 2015-04-29 DIAGNOSIS — I5022 Chronic systolic (congestive) heart failure: Secondary | ICD-10-CM

## 2015-04-29 LAB — POCT INR: INR: 3.1

## 2015-04-29 MED ORDER — FUROSEMIDE 40 MG PO TABS: 40.0000 mg | ORAL_TABLET | Freq: Every day | ORAL | Status: DC

## 2015-04-29 NOTE — Assessment & Plan Note (Signed)
Assessment: Patient states currently still smoking although states recently has decided to try and cut back.  States he is smoking about 2-3 cigarettes per day.  Plan: -Encouraged tobacco cessation and offered support -Provided information regarding the [employer]

## 2015-04-29 NOTE — Progress Notes (Signed)
Anticoagulation Management [name] is a 69 y.o. male who reports to the clinic for monitoring of warfarin treatment.    Indication: atrial fibrillation Duration: indefinite  Anticoagulation Clinic Visit History: Anticoagulation Episode Summary    Current INR goal 2.0-3.0  Next INR check 05/13/2015  INR from last check 3.1! (04/29/2015)  Weekly max dose   Target end date Indefinite  [facility]  Preferred lab   Send INR reminders to    Indications  Atrial fibrillation ([place]) [I48.91]        Comments        ASSESSMENT Recent Results: Recent results are below, the most recent result is correlated with a dose of 15 mg per week: Lab Results  Component Value Date   INR 3.1 04/29/2015   INR 2.10 04/01/2015   INR 2.23* 02/25/2015  Pt denies s/sx of bleeding ,recent changes in medications, diet changes.   INR today: Supratherapeutic  Anticoagulation Dosing: INR as of 04/29/2015 and Previous Dosing Information    INR Dt INR Goal [facility] [name]   04/29/2015 3.1 2.0-3.0 15 mg 2.5 mg 2.5 mg 2.5 mg 2.5 mg 2.5 mg 0 mg 2.5 mg    Anticoagulation Dose Instructions as of 04/29/2015      Total [name]   New Dose 15 mg 2.5 mg 2.5 mg 2.5 mg 2.5 mg 2.5 mg 0 mg 2.5 mg     (5 mg x 0.5)  (5 mg x 0.5)  (5 mg x 0.5)  (5 mg x 0.5)  (5 mg x 0.5)  -  (5 mg x 0.5)                           PLAN Weekly dose was unchanged. Will continue current dose and f/u INR in 2 weeks since pt slightly supratherapeutic today.   Patient Instructions  Patient instructed to take medications as defined in the Anti-coagulation Track section of this encounter.  Patient instructed to take today's dose.  Patient verbalized understanding of these instructions.    Follow-up Return in about 2 weeks (around 05/13/2015) for f/u INR 10/24 9 am.  Bennye Alm, PharmD Pharmacy Resident [phone]  20 minutes spent  face-to-face with the patient during the encounter. 50% of time spent on education. 50% of time was spent on assessment and plan.

## 2015-04-29 NOTE — Assessment & Plan Note (Signed)
Assessment: BP today well controlled at 127/63  Plan: -continue current medications -refill Lasix

## 2015-04-29 NOTE — Patient Instructions (Signed)
Please try to bring all your medicines next time. This helps [place] take good care of you and stops mistakes from medicines that could hurt you.   High Blood Pressure Your BP today is 129/63.  It is well-controlled. Your goal is to have a BP average < 140/90. Medicine Changes: we refilled your Lasix today Homework: Continue to exercise regularly and eat a well-balanced diet, especially monitoring your salt intake.  Monitor your blood pressure at home 3 times per week or occasionally at the drug store.   Smoking cessation instruction/counseling given:  counseled patient on the dangers of tobacco use, advised patient to stop smoking, and reviewed strategies to maximize success.  Please think about quitting smoking.  This is very important for your health.  There are many things we can do to help you quit.  Let  us know if you are interested.  You can also call 1-800-QUIT-NOW (281)586-2266) for free smoking cessation counseling.

## 2015-04-29 NOTE — Progress Notes (Signed)
Patient ID: [name], male   DOB: 20-Feb-1946, 69 y.o.   MRN: [id number]   Subjective:   Patient ID: [name] male   DOB: Jun 12, 1946 69 y.o.   MRN: [id number]  HPI: [name] is a 69 y.o. male with past medical history of substance abuse, COPD, CHF (Echo 05/2014 with EF 60-65%), hypertension, atrial fibrillation (on amiodarone and warfarin) s/p ablation 2012 and s/p implantable loop recorder 08/2013, and polycythemia vera who presents today for follow up of his chronic medical conditions.  He is also here for an INR check as he is on lifelong coumadin.  He is requesting a refill of his Lasix.    Past Medical History  Diagnosis Date  . Cardiomyopathy     EF55% 11/14<<35%   . Substance abuse     alcohol  . Hypertension   . Atrial fibrillation /flutter     hx of flutter ablation 2/12  . Shortness of breath     "all the time lately" (12/14/2012)  . GERD (gastroesophageal reflux disease)   . Hepatitis     "think I had that once; a long time ago; from dirty needles I think" (12/14/2012)  . 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 Leukocytosis, unspecified 06/12/2013  . Myeloproliferative neoplasm ([place]) 08/15/2013  . Obesity   . Syncope   . Cancer ([place])   . CHF (congestive heart failure) ([place])   . Renal insufficiency   . COPD (chronic obstructive pulmonary disease) ([place])    Current Outpatient Prescriptions  Medication Sig Dispense Refill  . albuterol (PROAIR HFA) 108 (90 BASE) MCG/ACT inhaler Inhale 1-2 puffs into the lungs daily as needed for wheezing or shortness of breath. 18 g 5  . amiodarone (PACERONE) 200 MG tablet Take 1 tablet (200 mg total) by mouth daily. 30 tablet 6  . Budesonide (PULMICORT FLEXHALER) 90 MCG/ACT inhaler Inhale 1 puff into the lungs daily.     . carvedilol (COREG) 12.5 MG tablet Take 12.5 mg by mouth 2 (two) times daily with a meal.     . folic acid (FOLVITE) 1 MG tablet Take 1 tablet (1 mg total) by mouth daily. 30  tablet 2  . furosemide (LASIX) 40 MG tablet Take 1 tablet (40 mg total) by mouth daily. 30 tablet 3  . hydroxyurea (HYDREA) 500 MG capsule Take 2 capsules (1,000 mg total) by mouth daily. May take with food to minimize GI side effects. (Patient taking differently: Take 1,500 mg by mouth daily. May take with food to minimize GI side effects.) 90 capsule 3  . hydroxyurea (HYDREA) 500 MG capsule take 3 capsules by mouth once daily with food TO MINIMIZE GI SIDE EFFECTS 90 capsule 3  . methadone (DOLOPHINE) 10 MG/5ML solution Take 46 mg by mouth every morning.     . Multiple Vitamin (MULTIVITAMIN WITH MINERALS) TABS tablet Take 1 tablet by mouth daily. 60 tablet 2  . tiotropium (SPIRIVA HANDIHALER) 18 MCG inhalation capsule Place 1 capsule (18 mcg total) into inhaler and inhale daily. 30 capsule 12  . warfarin (COUMADIN) 5 MG tablet Take 0.5 tablets (2.5 mg total) by mouth daily. 30 tablet 1   No current facility-administered medications for this visit.   Family History  Problem Relation Age of Onset  . Diabetes Mother   . Hypertension Mother   . Cirrhosis Father   . Alcohol abuse Father   . Colon cancer Neg Hx   . Rectal cancer Neg Hx   . Stomach cancer Neg Hx  Social History   Social History  . Marital Status: Single    Spouse Name: N/A  . Number of Children: N/A  . Years of Education: N/A   Occupational History  . Retired    Social History Main Topics  . Smoking status: Current Every Day Smoker -- 0.20 packs/day for 40 years    Types: Cigarettes  . Smokeless tobacco: Never Used     Comment: patient smoked 1-2 cigs x 50 years, now smoking approx 4-5 cigs per day x 1 year  . Alcohol Use: 0.0 oz/week    0 Standard drinks or equivalent per week  . Drug Use: Yes    Special: "Crack" cocaine, Cocaine, Heroin     Comment: 03/20/2013 "been clean for awhile"; endorses using heroin "all my life" up until about a year ago  . Sexual Activity: Not Currently   Other Topics Concern  . None    Social History Narrative   Moved here from [place]. Lives with common law wife and grandchildren. He is retired from "general labor."   Review of Systems: Review of Systems  Constitutional: Negative for fever and chills.  HENT: Negative for congestion and sore throat.   Eyes: Negative for blurred vision and pain.  Respiratory: Positive for shortness of breath (chronic, not worse than baseline). Negative for cough.   Cardiovascular: Negative for chest pain and leg swelling.  Gastrointestinal: Negative for nausea, vomiting, diarrhea and constipation.  Genitourinary: Negative for dysuria, frequency and hematuria.  Musculoskeletal: Negative for falls.  Skin: Negative for rash.  Neurological: Negative for dizziness and headaches.  Psychiatric/Behavioral: Negative for depression and substance abuse.     Objective:  Physical Exam: Filed Vitals:   04/29/15 0940  BP: 127/63  Pulse: 49  Temp: 98 F (36.7 C)  TempSrc: Oral  Weight: 211 lb 3.2 oz (95.8 kg)  SpO2: 99%   Physical Exam  Constitutional: He is oriented to person, place, and time. He appears well-developed and well-nourished.  HENT:  Head: Normocephalic and atraumatic.  Eyes: Conjunctivae and EOM are normal.  Neck: Normal range of motion.  Cardiovascular: Regular rhythm, normal heart sounds and intact distal pulses.   bradycardic  Pulmonary/Chest: Effort normal. He has wheezes (mild scattered wheezes). He has no rales.  Abdominal: Soft. Bowel sounds are normal.  Musculoskeletal: Normal range of motion. He exhibits no edema.  Neurological: He is alert and oriented to person, place, and time.  Skin: Skin is warm and dry.  Psychiatric: He has a normal mood and affect. His behavior is normal.     Assessment & Plan:  Please see Problem List for Assessment and Plan

## 2015-04-29 NOTE — Patient Instructions (Signed)
Patient instructed to take medications as defined in the Anti-coagulation Track section of this encounter.  Patient instructed to take today's dose.  Patient verbalized understanding of these instructions.    

## 2015-04-29 NOTE — Assessment & Plan Note (Signed)
Assessment: Patient noted to have hematuria during 10/2014 hospital admission.  Appears to be chronic since starting Coumadin in 2014.  Negative for hematuria most recently in 11/2014.   Patient denies any frank blood in his urine.  Was provided urology referral back in May but did not follow up.  Continues to smoke.  Plan: -discussed with patient regarding sending him for urology referral.  Patient declines at this time. -instructed patient to monitor urine for any blood or changes and to let us know if this happens.

## 2015-04-29 NOTE — Assessment & Plan Note (Signed)
Assessment: Patient presented to Coumadin clinic today for INR check.  INR today is 3.1.  Patient denies any signs of bleeding such as melena, hematuria, hematochezia, epistaxis.  Plan:  -weekly dose was unchanged, will follow up in 2 weeks given slightly therapeutic INR today

## 2015-04-30 NOTE — Progress Notes (Signed)
LOOP RECORDER  

## 2015-04-30 NOTE — Progress Notes (Signed)
Patient was seen in clinic with Bennye Alm, PharmD, PGY1 pharmacy resident. I agree with the assessment and plan of care documented.

## 2015-04-30 NOTE — Progress Notes (Signed)
Internal Medicine Clinic Attending  I saw and evaluated the patient.  I personally confirmed the key portions of the history and exam documented by Dr. Wallace and I reviewed pertinent patient test results.  The assessment, diagnosis, and plan were formulated together and I agree with the documentation in the resident's note. 

## 2015-05-03 ENCOUNTER — Telehealth: Payer: Self-pay | Admitting: *Deleted

## 2015-05-03 NOTE — Telephone Encounter (Signed)
Attempted to reach patient regarding brady episode on LINQ transmission from 04/30/15.  No answer, voicemail not setup, will attempt to reach patient again later.

## 2015-05-08 ENCOUNTER — Encounter: Payer: Self-pay | Admitting: Internal Medicine

## 2015-05-09 NOTE — Telephone Encounter (Signed)
Attempted to reach patient regarding brady episode on LINQ transmission from 04/30/15.  No answer, voicemail not setup.  Will attempt again tomorrow.

## 2015-05-13 ENCOUNTER — Ambulatory Visit (INDEPENDENT_AMBULATORY_CARE_PROVIDER_SITE_OTHER): Payer: Medicare Other | Admitting: Pharmacist

## 2015-05-13 DIAGNOSIS — Z7901 Long term (current) use of anticoagulants: Secondary | ICD-10-CM | POA: Diagnosis not present

## 2015-05-13 DIAGNOSIS — I4891 Unspecified atrial fibrillation: Secondary | ICD-10-CM | POA: Diagnosis not present

## 2015-05-13 LAB — POCT INR: INR: 3.9

## 2015-05-13 NOTE — Progress Notes (Signed)
Anti-Coagulation Progress Note  [name] is a 69 y.o. male who is currently on an anti-coagulation regimen.    RECENT RESULTS: Recent results are below, the most recent result is correlated with a dose of 15 mg. per week: Lab Results  Component Value Date   INR 3.90 05/13/2015   INR 3.1 04/29/2015   INR 2.10 04/01/2015    ANTI-COAG DOSE: Anticoagulation Dose Instructions as of 05/13/2015      [name] Tue Wed Thu Fri Sat   New Dose 2.5 mg 2.5 mg 0 mg 2.5 mg 2.5 mg 0 mg 2.5 mg       ANTICOAG SUMMARY: Anticoagulation Episode Summary    Current INR goal 2.0-3.0  Next INR check 05/20/2015  INR from last check 3.90! (05/13/2015)  Weekly max dose   Target end date Indefinite  [facility]  Preferred lab   Send INR reminders to    Indications  Atrial fibrillation ([place]) [I48.91]        Comments         ANTICOAG TODAY: Anticoagulation Summary as of 05/13/2015    INR goal 2.0-3.0  Selected INR 3.90! (05/13/2015)  Next INR check 05/20/2015  Target end date Indefinite   Indications  Atrial fibrillation ([place]) [I48.91]      Anticoagulation Episode Summary    [facility]   Preferred lab    Send INR reminders to    Comments       PATIENT INSTRUCTIONS: Patient Instructions  Patient instructed to take medications as defined in the Anti-coagulation Track section of this encounter.  Patient instructed to take today's dose. OMIT doses on TUESDAYS and FRIDAYS. Do NOT take warfarin on TUESDAYS or FRIDAYS. Patient verbalized understanding of these instructions.       FOLLOW-UP Return in 7 days (on 05/20/2015) for Follow up INR at 0915h.  [name], III Pharm.D., CACP

## 2015-05-13 NOTE — Patient Instructions (Signed)
Patient instructed to take medications as defined in the Anti-coagulation Track section of this encounter.  Patient instructed to take today's dose. OMIT doses on TUESDAYS and FRIDAYS. Do NOT take warfarin on TUESDAYS or FRIDAYS. Patient verbalized understanding of these instructions.

## 2015-05-14 NOTE — Progress Notes (Signed)
Indication: Atrial fibrillation.  Duration: Lifelong.  INR above target.  Agree with Dr. Gladstone Pih assessment and plan as documented.

## 2015-05-16 ENCOUNTER — Encounter: Payer: Self-pay | Admitting: Cardiology

## 2015-05-17 NOTE — Telephone Encounter (Signed)
Able to reach patient.  He denies any symptoms during tachy episode on 04/30/15.  Patient appreciative of call and aware to call with worsening symptoms, questions, or concerns.

## 2015-05-22 ENCOUNTER — Encounter: Payer: Self-pay | Admitting: Internal Medicine

## 2015-05-24 ENCOUNTER — Encounter: Payer: Self-pay | Admitting: Cardiology

## 2015-05-27 ENCOUNTER — Ambulatory Visit (INDEPENDENT_AMBULATORY_CARE_PROVIDER_SITE_OTHER): Payer: Medicare Other | Admitting: *Deleted

## 2015-05-27 DIAGNOSIS — R55 Syncope and collapse: Secondary | ICD-10-CM

## 2015-05-27 NOTE — Progress Notes (Signed)
Carelink Summary Report / Loop recorder 

## 2015-05-30 ENCOUNTER — Encounter: Payer: Self-pay | Admitting: Cardiology

## 2015-05-30 ENCOUNTER — Ambulatory Visit (INDEPENDENT_AMBULATORY_CARE_PROVIDER_SITE_OTHER): Payer: Medicare Other | Admitting: Pharmacist

## 2015-05-30 DIAGNOSIS — Z7901 Long term (current) use of anticoagulants: Secondary | ICD-10-CM

## 2015-05-30 DIAGNOSIS — I4891 Unspecified atrial fibrillation: Secondary | ICD-10-CM | POA: Diagnosis present

## 2015-05-30 LAB — POCT INR: INR: 2.6

## 2015-05-30 MED ORDER — WARFARIN SODIUM 2.5 MG PO TABS: 2.5000 mg | ORAL_TABLET | Freq: Every day | ORAL | Status: DC

## 2015-05-30 NOTE — Progress Notes (Signed)
INTERNAL MEDICINE TEACHING ATTENDING ADDENDUM - Lalla Brothers M.D  Duration- indefinite, Indication- atrial fibrillation, INR- at goal. Agree with pharmacy recommendations as outlined in their note.

## 2015-05-30 NOTE — Patient Instructions (Signed)
Patient educated about medication as defined in this encounter and verbalized understanding by repeating back instructions provided.   

## 2015-05-30 NOTE — Progress Notes (Signed)
Anticoagulation Management [name] is a 69 y.o. male who reports to the clinic for monitoring of warfarin treatment.    Indication: atrial fibrillation Duration: indefinite  Anticoagulation Clinic Visit History: Anticoagulation Episode Summary    Current INR goal 2.0-3.0  Next INR check 06/10/2015  INR from last check 2.6 (05/30/2015)  Weekly max dose   Target end date Indefinite  [facility]  Preferred lab   Send INR reminders to    Indications  Atrial fibrillation ([place]) [I48.91]        Comments        ASSESSMENT Recent Results: Recent results are below, the most recent result is correlated with a dose of 15 mg per week: Lab Results  Component Value Date   INR 2.6 05/30/2015   INR 3.90 05/13/2015   INR 3.1 04/29/2015    INR today: Therapeutic  Anticoagulation Dosing: INR as of 05/30/2015 and Previous Dosing Information    INR Dt INR Goal Wkly Tot [name]   05/30/2015 2.6 2.0-3.0 12.5 mg 2.5 mg 2.5 mg 0 mg 2.5 mg 2.5 mg 0 mg 2.5 mg   Patient deviated from recommended dosing.       Anticoagulation Dose Instructions as of 05/30/2015      Total [name]   New Dose 15 mg 2.5 mg 2.5 mg 0 mg 2.5 mg 2.5 mg 2.5 mg 2.5 mg     (2.5 mg x 1)  (2.5 mg x 1)  -  (2.5 mg x 1)  (2.5 mg x 1)  (2.5 mg x 1)  (2.5 mg x 1)                         Description        Patient to continue taking current pill strength of 5 mg, 1/2 tablet daily except Tuesdays, until he runs out of pills. Upon next refill, pill strength will change to 2.5 mg---and he will take 1 tablet daily except Tuesdays. Patient instructed as appropriate and given a pictogram with the pill strengths and colors. Patient verbalized understanding by repeating back information. Alert also sent to pharmacy with new Rx for 2.5 mg.      PLAN Weekly dose was unchanged.  Patient Instructions  Patient educated about medication as defined in  this encounter and verbalized understanding by repeating back instructions provided.    Follow-up Return in about 11 days (around 06/10/2015) for For follow up INR on 06/10/15 at 9:30am.  Brayam Boeke J  15 minutes spent face-to-face with the patient during the encounter. 75% of time spent on education. 25% of time was spent on assessment and plan.

## 2015-06-02 ENCOUNTER — Other Ambulatory Visit: Payer: Self-pay | Admitting: Internal Medicine

## 2015-06-07 ENCOUNTER — Other Ambulatory Visit: Payer: Self-pay | Admitting: Internal Medicine

## 2015-06-07 NOTE — Telephone Encounter (Signed)
This prescription for 5mg  tabs has been discontinued.  Patient seen on 11/10 and instructed to take current pill strength of 5mg , 1/2 tablet every day except Tues, until he runs out.  New rx given that day for new pill strength of 2.5mg  to take 1 tablet daily except Tuesday

## 2015-06-10 ENCOUNTER — Ambulatory Visit: Payer: Medicare Other

## 2015-06-11 ENCOUNTER — Ambulatory Visit (INDEPENDENT_AMBULATORY_CARE_PROVIDER_SITE_OTHER): Payer: Medicare Other | Admitting: Pharmacist

## 2015-06-11 DIAGNOSIS — I4891 Unspecified atrial fibrillation: Secondary | ICD-10-CM | POA: Diagnosis not present

## 2015-06-11 LAB — POCT INR: INR: 3.4

## 2015-06-11 NOTE — Progress Notes (Signed)
Anticoagulation Management [name] is a 69 y.o. male who reports to the clinic for monitoring of warfarin treatment.    Indication: atrial fibrillation Duration: indefinite  Anticoagulation Clinic Visit History: Anticoagulation Episode Summary    Current INR goal 2.0-3.0  Next INR check 06/28/2015  INR from last check 3.4! (06/11/2015)  Weekly max dose   Target end date Indefinite  [facility]  Preferred lab   Send INR reminders to    Indications  Atrial fibrillation ([place]) [I48.91]        Comments        ASSESSMENT Recent Results: Recent results are below, the most recent result is correlated with a dose of 15 mg per week: Lab Results  Component Value Date   INR 3.4 06/11/2015   INR 2.6 05/30/2015   INR 3.90 05/13/2015   INR today: Supratherapeutic  Anticoagulation Dosing: INR as of 06/11/2015 and Previous Dosing Information    INR Dt INR Goal Wkly Tot [name]   06/11/2015 3.4 2.0-3.0 15 mg 2.5 mg 2.5 mg 0 mg 2.5 mg 2.5 mg 2.5 mg 2.5 mg    Previous description        Patient to continue taking current pill strength of 5 mg, 1/2 tablet daily except Tuesdays, until he runs out of pills. Upon next refill, pill strength will change to 2.5 mg---and he will take 1 tablet daily except Tuesdays. Patient instructed as appropriate and given a pictogram with the pill strengths and colors. Patient verbalized understanding by repeating back information. Alert also sent to pharmacy with new Rx for 2.5 mg.    Anticoagulation Dose Instructions as of 06/11/2015      Total [name]   New Dose 13.75 mg 2.5 mg 1.25 mg 2.5 mg 1.25 mg 2.5 mg 1.25 mg 2.5 mg     (2.5 mg x 1)  (2.5 mg x 0.5)  (2.5 mg x 1)  (2.5 mg x 0.5)  (2.5 mg x 1)  (2.5 mg x 0.5)  (2.5 mg x 1)                           PLAN Weekly dose was decreased by 8% to 13.75 mg per week  Patient Instructions  Patient educated about  medication as defined in this encounter and verbalized understanding by repeating back instructions provided.   Follow-up Return in about 2 weeks (around 06/28/2015) for Follow up INR on 06/28/15 at 9:30 am.  [name]  15 minutes spent face-to-face with the patient during the encounter. 60% of time spent on education. 40% of time was spent on assessment and plan.

## 2015-06-11 NOTE — Patient Instructions (Signed)
Patient educated about medication as defined in this encounter and verbalized understanding by repeating back instructions provided.   

## 2015-06-12 NOTE — Progress Notes (Signed)
I have reviewed Dr. Kim's note.  

## 2015-06-23 LAB — CUP PACEART REMOTE DEVICE CHECK: Date Time Interrogation Session: 20161107070619

## 2015-06-23 NOTE — Progress Notes (Signed)
Carelink summary report received. Battery status OK. Normal device function. No new symptom, tachy, or pause episodes. 2 brady episodes--patient asymptomatic. 1 AF episode (burden 0.6%), +warfarin, avg V rate controlled. Monthly summary reports and ROV with GT in 11/2015.

## 2015-06-26 ENCOUNTER — Ambulatory Visit (INDEPENDENT_AMBULATORY_CARE_PROVIDER_SITE_OTHER): Payer: Medicare Other | Admitting: *Deleted

## 2015-06-26 DIAGNOSIS — R55 Syncope and collapse: Secondary | ICD-10-CM

## 2015-06-28 ENCOUNTER — Encounter: Payer: Self-pay | Admitting: Cardiology

## 2015-06-28 ENCOUNTER — Ambulatory Visit (INDEPENDENT_AMBULATORY_CARE_PROVIDER_SITE_OTHER): Payer: Medicare Other | Admitting: Pharmacist

## 2015-06-28 DIAGNOSIS — I4891 Unspecified atrial fibrillation: Secondary | ICD-10-CM

## 2015-06-28 LAB — POCT INR: INR: 2

## 2015-06-28 NOTE — Progress Notes (Signed)
Anticoagulation Management [name] is a 69 y.o. male who reports to the clinic for monitoring of warfarin treatment.    Indication: atrial fibrillation Duration: indefinite  Anticoagulation Clinic Visit History: Anticoagulation Episode Summary    Current INR goal 2.0-3.0  Next INR check 07/26/2015  INR from last check 2.0 (06/28/2015)  Weekly max dose   Target end date Indefinite  [facility]  Preferred lab   Send INR reminders to    Indications  Atrial fibrillation ([place]) [I48.91]        Comments        ASSESSMENT Recent Results: Recent results are below, the most recent result is correlated with a dose of 13.75 mg per week: Lab Results  Component Value Date   INR 2.0 06/28/2015   INR 3.4 06/11/2015   INR 2.6 05/30/2015   INR today: Therapeutic  Anticoagulation Dosing: INR as of 06/28/2015 and Previous Dosing Information    INR Dt INR Goal [name] [name]   06/28/2015 2.0 2.0-3.0 13.75 mg 2.5 mg 1.25 mg 2.5 mg 1.25 mg 2.5 mg 1.25 mg 2.5 mg    Anticoagulation Dose Instructions as of 06/28/2015      Total [name]   New Dose 13.75 mg 2.5 mg 1.25 mg 2.5 mg 1.25 mg 2.5 mg 1.25 mg 2.5 mg     (2.5 mg x 1)  (2.5 mg x 0.5)  (2.5 mg x 1)  (2.5 mg x 0.5)  (2.5 mg x 1)  (2.5 mg x 0.5)  (2.5 mg x 1)                           PLAN Weekly dose was unchanged  Patient Instructions  Patient educated about medication as defined in this encounter and verbalized understanding by repeating back instructions provided.    Follow-up Return in about 4 weeks (around 07/29/2015) for Follow up INR 07/29/15 at 9:30 am.  Bernard Slayden J  15 minutes spent face-to-face with the patient during the encounter. 75% of time spent on education. 25% of time was spent on assessment and plan.

## 2015-06-28 NOTE — Patient Instructions (Signed)
Patient educated about medication as defined in this encounter and verbalized understanding by repeating back instructions provided.   

## 2015-06-28 NOTE — Progress Notes (Signed)
INTERNAL MEDICINE TEACHING ATTENDING ADDENDUM - Lalla Brothers M.D  Duration- Indefinite, Indication- Atrial fibrillation, INR- at goal. Agree with pharmacy recommendations as outlined in their note.

## 2015-06-28 NOTE — Progress Notes (Signed)
Carelink Summary Report / Loop Recorder 

## 2015-07-01 ENCOUNTER — Encounter: Payer: Self-pay | Admitting: Hematology and Oncology

## 2015-07-26 ENCOUNTER — Ambulatory Visit (INDEPENDENT_AMBULATORY_CARE_PROVIDER_SITE_OTHER): Payer: Medicare Other | Admitting: *Deleted

## 2015-07-26 DIAGNOSIS — R55 Syncope and collapse: Secondary | ICD-10-CM | POA: Diagnosis not present

## 2015-07-26 NOTE — Progress Notes (Signed)
Carelink Summary Report / Loop Recorder 

## 2015-07-29 ENCOUNTER — Ambulatory Visit (INDEPENDENT_AMBULATORY_CARE_PROVIDER_SITE_OTHER): Payer: Medicare Other | Admitting: Pharmacist

## 2015-07-29 ENCOUNTER — Other Ambulatory Visit: Payer: Self-pay | Admitting: *Deleted

## 2015-07-29 ENCOUNTER — Ambulatory Visit: Payer: Medicare Other

## 2015-07-29 DIAGNOSIS — Z7901 Long term (current) use of anticoagulants: Secondary | ICD-10-CM | POA: Diagnosis not present

## 2015-07-29 DIAGNOSIS — I5022 Chronic systolic (congestive) heart failure: Secondary | ICD-10-CM

## 2015-07-29 DIAGNOSIS — I4891 Unspecified atrial fibrillation: Secondary | ICD-10-CM | POA: Diagnosis present

## 2015-07-29 LAB — POCT INR: INR: 2.3

## 2015-07-29 MED ORDER — FOLIC ACID 1 MG PO TABS: 1.0000 mg | ORAL_TABLET | Freq: Every day | ORAL | Status: DC

## 2015-07-29 MED ORDER — FUROSEMIDE 40 MG PO TABS: 40.0000 mg | ORAL_TABLET | Freq: Every day | ORAL | Status: DC

## 2015-07-29 NOTE — Progress Notes (Signed)
Anti-Coagulation Progress Note  [name] is a 70 y.o. male who is currently on an anti-coagulation regimen.    RECENT RESULTS: Recent results are below, the most recent result is correlated with a dose of 13.75 mg. per week: Lab Results  Component Value Date   INR 2.30 07/29/2015   INR 2.0 06/28/2015   INR 3.4 06/11/2015    ANTI-COAG DOSE: Anticoagulation Dose Instructions as of 07/29/2015      [name] Tue Wed Thu Fri Sat   New Dose 2.5 mg 1.25 mg 2.5 mg 1.25 mg 2.5 mg 1.25 mg 2.5 mg       ANTICOAG SUMMARY: Anticoagulation Episode Summary    Current INR goal 2.0-3.0  Next INR check 08/26/2015  INR from last check 2.30 (07/29/2015)  Weekly max dose   Target end date Indefinite  [facility]  Preferred lab   Send INR reminders to    Indications  Atrial fibrillation ([place]) [I48.91]        Comments         ANTICOAG TODAY: Anticoagulation Summary as of 07/29/2015    INR goal 2.0-3.0  Selected INR 2.30 (07/29/2015)  Next INR check 08/26/2015  Target end date Indefinite   Indications  Atrial fibrillation ([place]) [I48.91]      Anticoagulation Episode Summary    [facility]   Preferred lab    Send INR reminders to    Comments       PATIENT INSTRUCTIONS: Patient Instructions  Patient instructed to take medications as defined in the Anti-coagulation Track section of this encounter.  Patient instructed to take today's dose.  Patient verbalized understanding of these instructions.       FOLLOW-UP Return in 4 weeks (on 08/26/2015) for Follow up INR at 0930h.  [name], III Pharm.D., CACP

## 2015-07-29 NOTE — Patient Instructions (Signed)
Patient instructed to take medications as defined in the Anti-coagulation Track section of this encounter.  Patient instructed to take today's dose.  Patient verbalized understanding of these instructions.    

## 2015-07-30 NOTE — Progress Notes (Signed)
I have reviewed Dr. Gladstone Pih note.  Patient is on [facility] for Afib.  His INR was at goal.

## 2015-07-31 LAB — CUP PACEART REMOTE DEVICE CHECK: Date Time Interrogation Session: 20161207073541

## 2015-07-31 NOTE — Progress Notes (Signed)
Carelink summary report received. Battery status OK. Normal device function. AF burden stable, on Warfarin, V rates controlled. No new symptom episodes, tachy episodes, brady, or pause episodes. Monthly summary reports and ROV/PRN

## 2015-08-01 ENCOUNTER — Ambulatory Visit (INDEPENDENT_AMBULATORY_CARE_PROVIDER_SITE_OTHER): Payer: Medicare Other | Admitting: Internal Medicine

## 2015-08-01 ENCOUNTER — Encounter: Payer: Self-pay | Admitting: Internal Medicine

## 2015-08-01 VITALS — BP 110/63 | HR 53 | Temp 98.1°F | Wt 206.2 lb

## 2015-08-01 DIAGNOSIS — F1121 Opioid dependence, in remission: Secondary | ICD-10-CM | POA: Diagnosis not present

## 2015-08-01 DIAGNOSIS — I10 Essential (primary) hypertension: Secondary | ICD-10-CM | POA: Diagnosis present

## 2015-08-01 DIAGNOSIS — Z79899 Other long term (current) drug therapy: Secondary | ICD-10-CM

## 2015-08-01 DIAGNOSIS — Z7901 Long term (current) use of anticoagulants: Secondary | ICD-10-CM

## 2015-08-01 DIAGNOSIS — I4891 Unspecified atrial fibrillation: Secondary | ICD-10-CM | POA: Diagnosis present

## 2015-08-01 DIAGNOSIS — F1111 Opioid abuse, in remission: Secondary | ICD-10-CM

## 2015-08-01 DIAGNOSIS — F17210 Nicotine dependence, cigarettes, uncomplicated: Secondary | ICD-10-CM

## 2015-08-01 DIAGNOSIS — Z720 Tobacco use: Secondary | ICD-10-CM

## 2015-08-01 NOTE — Assessment & Plan Note (Signed)
Assessment: Well controlled with BP of 110/63 on Coreg and Lasix.  Plan: - continue Coreg and Lasix - check BMP

## 2015-08-01 NOTE — Assessment & Plan Note (Signed)
Assessment: Patient with history of heroin abuse and accidental overdose 5 months ago after missing appointment at methadone clinic and being unable to get back with them.  Currently, states he is not going to methadone clinic nor taking methadone.  He also denies current heroin use.  Plan: - continue to monitor - HIV was non-reactive in 2015, consider rechecking at next visit

## 2015-08-01 NOTE — Assessment & Plan Note (Signed)
Assessment: Patient states currently still smoking although states still trying to cut back. States he is smoking about 2-3 cigarettes per day.  This is the same as he was in October.  Plan: -Encouraged tobacco cessation and offered support -Provided information regarding the [employer]

## 2015-08-01 NOTE — Progress Notes (Signed)
Patient ID: [name], male   DOB: 1945/12/09, 70 y.o.   MRN: [id number]   Subjective:   Patient ID: [name] male   DOB: [birth year] 70 y.o.   MRN: [id number]  HPI: [name] is a 70 y.o. male with PMHx of substance abuse, COPD, HFpEF (Echo 05/2014 with EF 60-65%), HTN, atrial fibrillation (on amiodarone and Warfarin) s/p ablation 2012 and s/p implantable loop recorder 08/2013, and polycythemia vera who presents today for follow up of his HTN, COPD, CHF.  Please see A&P for status of patients chronic medical conditions addressed at today's visit.    Past Medical History  Diagnosis Date  . Cardiomyopathy     EF55% 11/14<<35%   . Substance abuse     alcohol  . Hypertension   . Atrial fibrillation /flutter     hx of flutter ablation 2/12  . Shortness of breath     "all the time lately" (12/14/2012)  . GERD (gastroesophageal reflux disease)   . Hepatitis     "think I had that once; a long time ago; from dirty needles I think" (12/14/2012)  . 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 Leukocytosis, unspecified 06/12/2013  . 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 Obesity   . Syncope   . Cancer ([place])   . CHF (congestive heart failure) ([place])   . Renal insufficiency   . COPD (chronic obstructive pulmonary disease) ([place])    Current Outpatient Prescriptions  Medication Sig Dispense Refill  . albuterol (PROAIR HFA) 108 (90 BASE) MCG/ACT inhaler Inhale 1-2 puffs into the lungs daily as needed for wheezing or shortness of breath. 18 g 5  . amiodarone (PACERONE) 200 MG tablet Take 1 tablet (200 mg total) by mouth daily. 30 tablet 6  . Budesonide (PULMICORT FLEXHALER) 90 MCG/ACT inhaler Inhale 1 puff into the lungs daily.     . carvedilol (COREG) 12.5 MG tablet Take 12.5 mg by mouth 2 (two) times daily with a meal.     . folic acid (FOLVITE) 1 MG tablet Take 1 tablet (1 mg total) by mouth daily. 90 tablet 3  . furosemide (LASIX) 40 MG tablet  Take 1 tablet (40 mg total) by mouth daily. 90 tablet 3  . hydroxyurea (HYDREA) 500 MG capsule Take 2 capsules (1,000 mg total) by mouth daily. May take with food to minimize GI side effects. (Patient taking differently: Take 1,500 mg by mouth daily. May take with food to minimize GI side effects.) 90 capsule 3  . hydroxyurea (HYDREA) 500 MG capsule take 3 capsules by mouth once daily with food TO MINIMIZE GI SIDE EFFECTS 90 capsule 3  . Multiple Vitamin (MULTIVITAMIN WITH MINERALS) TABS tablet Take 1 tablet by mouth daily. 60 tablet 2  . tiotropium (SPIRIVA HANDIHALER) 18 MCG inhalation capsule Place 1 capsule (18 mcg total) into inhaler and inhale daily. 30 capsule 12  . warfarin (COUMADIN) 2.5 MG tablet Take 1 tablet (2.5 mg total) by mouth daily. Except skip dose on Tuesdays. PHARMACIST NOTE: DOSE AND TABLET STRENGTH CHANGE. PLEASE VOID PREVIOUS RX ON FILE FOR WARFARIN. 25 tablet 3  . methadone (DOLOPHINE) 10 MG/5ML solution Take 46 mg by mouth every morning. Reported on 08/01/2015     No current facility-administered medications for this visit.   Family History  Problem Relation Age of Onset  . Diabetes Mother   . Hypertension Mother   . Cirrhosis Father   . Alcohol abuse Father   . Colon cancer Neg Hx   . Rectal  cancer Neg Hx   . Stomach cancer Neg Hx    Social History   Social History  . Marital Status: Single    Spouse Name: N/A  . Number of Children: N/A  . Years of Education: N/A   Occupational History  . Retired    Social History Main Topics  . Smoking status: Current Every Day Smoker -- 0.20 packs/day for 40 years    Types: Cigarettes  . Smokeless tobacco: Never Used     Comment: patient smoked 1-2 cigs x 50 years, now smoking approx 4-5 cigs per day x 1 year  . Alcohol Use: 0.0 oz/week    0 Standard drinks or equivalent per week  . Drug Use: Yes    Special: "Crack" cocaine, Cocaine, Heroin     Comment: 03/20/2013 "been clean for awhile"; endorses using heroin "all my  life" up until about a year ago  . Sexual Activity: Not Currently   Other Topics Concern  . Not on file   Social History Narrative   Moved here from [place]. Lives with common law wife and grandchildren. He is retired from "general labor."   Review of Systems: Review of Systems  Constitutional: Negative for fever and chills.  Respiratory: Negative for shortness of breath.   Cardiovascular: Negative for chest pain and leg swelling.  Gastrointestinal: Negative for nausea, vomiting, diarrhea, constipation, blood in stool and melena.  Endo/Heme/Allergies: Does not bruise/bleed easily.  Psychiatric/Behavioral: Negative for substance abuse.    Objective:  Physical Exam: Filed Vitals:   08/01/15 1310  BP: 110/63  Pulse: 53  Temp: 98.1 F (36.7 C)  TempSrc: Oral  Weight: 206 lb 3.2 oz (93.532 kg)  SpO2: 98%   Physical Exam  Constitutional: He is oriented to person, place, and time.  Very pleasant, disheveled, no distress  HENT:  Head: Normocephalic and atraumatic.  Eyes: EOM are normal.  Neck: Normal range of motion.  Cardiovascular: Regular rhythm and normal heart sounds.   Bradycardic rate  Pulmonary/Chest: Effort normal and breath sounds normal.  Musculoskeletal: He exhibits no edema.  Neurological: He is alert and oriented to person, place, and time. No cranial nerve deficit.    Assessment & Plan:  Please see problem list for A&P.  Case discussed with Dr. Daryll Drown.

## 2015-08-01 NOTE — Assessment & Plan Note (Signed)
Assessment: Patient currently in normal sinus rhythm, currently on amiodarone and coumadin indefinitely.  Follows with Anti-coagulation clinic and most recent INR last week was 2.3.  CHADSVASc score is > 2.  Plan: - continue coumadin and amiodarone - follow up with coumadin clinic

## 2015-08-01 NOTE — Patient Instructions (Signed)
Thank you for coming to see me today. It was a pleasure. Today we talked about:   -tobacco cessation: you are doing a great job cutting back, keep up the good work!   Please think about quitting smoking.  This is very important for your health.  There are many things we can do to help you quit.  Let  us know if you are interested.  You can also call 1-800-QUIT-NOW (418) 303-0005) for free smoking cessation counseling. -keep taking your medications.  Try to bring them to every appointment.   Please follow-up with me in 6 months  If you have any questions or concerns, please do not hesitate to call the office at [phone].  Take Care,   Jule Ser, DO

## 2015-08-02 LAB — BMP8+ANION GAP
Anion Gap: 17 mmol/L (ref 10.0–18.0)
BUN/Creatinine Ratio: 16 (ref 10–22)
BUN: 22 mg/dL (ref 8–27)
CO2: 26 mmol/L (ref 18–29)
CREATININE: 1.35 mg/dL — AB (ref 0.76–1.27)
Calcium: 8.9 mg/dL (ref 8.6–10.2)
Chloride: 100 mmol/L (ref 96–106)
GFR calc Af Amer: 62 mL/min/{1.73_m2} (ref >59)
GFR, EST NON AFRICAN AMERICAN: 54 mL/min/{1.73_m2} — AB (ref >59)
Glucose: 110 mg/dL — ABNORMAL HIGH (ref 65–99)
Potassium: 3.5 mmol/L (ref 3.5–5.2)
SODIUM: 143 mmol/L (ref 134–144)

## 2015-08-04 NOTE — Progress Notes (Signed)
Internal Medicine Clinic Attending  Case discussed with Dr. Wallace at the time of the visit.  We reviewed the resident's history and exam and pertinent patient test results.  I agree with the assessment, diagnosis, and plan of care documented in the resident's note.  

## 2015-08-05 ENCOUNTER — Encounter: Payer: Self-pay | Admitting: Internal Medicine

## 2015-08-26 ENCOUNTER — Other Ambulatory Visit (HOSPITAL_BASED_OUTPATIENT_CLINIC_OR_DEPARTMENT_OTHER): Payer: Medicare Other

## 2015-08-26 ENCOUNTER — Encounter: Payer: Self-pay | Admitting: Hematology and Oncology

## 2015-08-26 ENCOUNTER — Ambulatory Visit (INDEPENDENT_AMBULATORY_CARE_PROVIDER_SITE_OTHER): Payer: Medicare Other | Admitting: Pharmacist

## 2015-08-26 ENCOUNTER — Ambulatory Visit (INDEPENDENT_AMBULATORY_CARE_PROVIDER_SITE_OTHER): Payer: Medicare Other | Admitting: *Deleted

## 2015-08-26 ENCOUNTER — Telehealth: Payer: Self-pay | Admitting: Hematology and Oncology

## 2015-08-26 ENCOUNTER — Ambulatory Visit (HOSPITAL_BASED_OUTPATIENT_CLINIC_OR_DEPARTMENT_OTHER): Payer: Medicare Other | Admitting: Hematology and Oncology

## 2015-08-26 VITALS — BP 133/61 | HR 65 | Temp 98.5°F | Resp 18 | Ht 66.0 in | Wt 212.0 lb

## 2015-08-26 DIAGNOSIS — D471 Chronic myeloproliferative disease: Secondary | ICD-10-CM

## 2015-08-26 DIAGNOSIS — Z720 Tobacco use: Secondary | ICD-10-CM | POA: Diagnosis not present

## 2015-08-26 DIAGNOSIS — D45 Polycythemia vera: Secondary | ICD-10-CM

## 2015-08-26 DIAGNOSIS — Z7901 Long term (current) use of anticoagulants: Secondary | ICD-10-CM

## 2015-08-26 DIAGNOSIS — I4891 Unspecified atrial fibrillation: Secondary | ICD-10-CM

## 2015-08-26 DIAGNOSIS — R55 Syncope and collapse: Secondary | ICD-10-CM | POA: Diagnosis not present

## 2015-08-26 LAB — CBC WITH DIFFERENTIAL/PLATELET
BASO%: 0.5 % (ref 0.0–2.0)
BASOS ABS: 0 10*3/uL (ref 0.0–0.1)
EOS ABS: 0.2 10*3/uL (ref 0.0–0.5)
EOS%: 3 % (ref 0.0–7.0)
HCT: 41.7 % (ref 38.4–49.9)
HGB: 14.1 g/dL (ref 13.0–17.1)
LYMPH%: 27.9 % (ref 14.0–49.0)
MCH: 39.1 pg — AB (ref 27.2–33.4)
MCHC: 33.8 g/dL (ref 32.0–36.0)
MCV: 115.8 fL — AB (ref 79.3–98.0)
MONO#: 0.6 10*3/uL (ref 0.1–0.9)
MONO%: 9.8 % (ref 0.0–14.0)
NEUT#: 3.3 10*3/uL (ref 1.5–6.5)
NEUT%: 58.8 % (ref 39.0–75.0)
Platelets: 148 10*3/uL (ref 140–400)
RBC: 3.6 10*6/uL — ABNORMAL LOW (ref 4.20–5.82)
RDW: 13.8 % (ref 11.0–14.6)
WBC: 5.6 10*3/uL (ref 4.0–10.3)
lymph#: 1.6 10*3/uL (ref 0.9–3.3)

## 2015-08-26 LAB — POCT INR: INR: 2.3

## 2015-08-26 NOTE — Assessment & Plan Note (Signed)
The patient has JAK2 mutation positive myeloproliferative disorder. He tolerated the hydroxyurea well.  However, last year, he developed mild pancytopenia. I reduced his hydroxyurea dose to 500 mg on Mondays, Wednesdays and Fridays and to take 1000 mg for the rest of the week and he labs are back to within normal limits I recommend we continue the same and I will see him back in 3 months with repeat history, physical examination and blood work

## 2015-08-26 NOTE — Progress Notes (Signed)
INTERNAL MEDICINE TEACHING ATTENDING ADDENDUM - Gwendolin Briel M.D  Duration- indefinite, Indication- afib, INR- therapeutic. Agree with pharmacy recommendations as outlined in their note.     

## 2015-08-26 NOTE — Telephone Encounter (Signed)
Gave patient avs report and appointments for May  °

## 2015-08-26 NOTE — Assessment & Plan Note (Signed)
The patient denies any recent signs or symptoms of bleeding such as spontaneous epistaxis, hematuria or hematochezia. he will continue current medical management. I recommend close follow-up with primary care doctor for medication adjustment. 

## 2015-08-26 NOTE — Patient Instructions (Signed)
Patient instructed to take medications as defined in the Anti-coagulation Track section of this encounter.  Patient instructed to take today's dose.  Patient verbalized understanding of these instructions.    

## 2015-08-26 NOTE — Progress Notes (Signed)
Anti-Coagulation Progress Note  [name] is a 70 y.o. male who is currently on an anti-coagulation regimen.    RECENT RESULTS: Recent results are below, the most recent result is correlated with a dose of 13.75 mg. per week: Lab Results  Component Value Date   INR 2.30 08/26/2015   INR 2.30 07/29/2015   INR 2.0 06/28/2015    ANTI-COAG DOSE: Anticoagulation Dose Instructions as of 08/26/2015      [name] Tue Wed Thu Fri Sat   New Dose 2.5 mg 1.25 mg 2.5 mg 1.25 mg 2.5 mg 1.25 mg 2.5 mg       ANTICOAG SUMMARY: Anticoagulation Episode Summary    Current INR goal 2.0-3.0  Next INR check 09/23/2015  INR from last check 2.30 (08/26/2015)  Weekly max dose   Target end date Indefinite  [facility]  Preferred lab   Send INR reminders to    Indications  Atrial fibrillation ([place]) [I48.91]        Comments         ANTICOAG TODAY: Anticoagulation Summary as of 08/26/2015    INR goal 2.0-3.0  Selected INR 2.30 (08/26/2015)  Next INR check 09/23/2015  Target end date Indefinite   Indications  Atrial fibrillation ([place]) [I48.91]      Anticoagulation Episode Summary    [facility]   Preferred lab    Send INR reminders to    Comments       PATIENT INSTRUCTIONS: Patient Instructions  Patient instructed to take medications as defined in the Anti-coagulation Track section of this encounter.  Patient instructed to take today's dose.  Patient verbalized understanding of these instructions.       FOLLOW-UP Return in 4 weeks (on 09/23/2015) for Follow up INR at 0930h.  [name], III Pharm.D., CACP

## 2015-08-26 NOTE — Assessment & Plan Note (Signed)
I spent some time counseling the patient the importance of tobacco cessation. He is currently attempting to quit on his own.  

## 2015-08-26 NOTE — Progress Notes (Signed)
[facility] OFFICE PROGRESS NOTE  Patient Care Team: Jule Ser, DO as PCP - General Heath Lark, MD as Consulting Physician (Hematology and Oncology)  SUMMARY OF ONCOLOGIC HISTORY:  This is a pleasant gentleman who is being referred here because of high hemoglobin level. In November 2014, we removed one unit of blood and order an additional workup to rule out myeloproliferative disorder. Blood test result confirmed low erythropoietin level along with detectable JAK 2 mutation, confirmed myeloproliferative disorder Unfortunately, bone marrow biopsy on 06/26/2013 was nondiagnostic On 07/31/13 the patient was started on 1 hydroxyurea a day On 08/15/2013, increased hydroxyurea to 2 tablets a day and remove one more unit of blood due to the high hemoglobin On 09/05/2013, I increased hydroxyurea to 3 tablets a day and remove one more unit of blood due to high hemoglobin and hematocrit On 01/23/2014, dose of hydroxyurea is reduced to 2 tablets a day due to anemia. On 04/23/2015, dose of hydroxyurea is adjusted to 500 mg on Mondays, Wednesdays and Fridays and to take 1000 mg for the rest of the week INTERVAL HISTORY: Please see below for problem oriented charting. He feels well Denies side effects of treatment. The patient denies any recent signs or symptoms of bleeding such as spontaneous epistaxis, hematuria or hematochezia. He stated he has been compliant taking all his medications. He continues to smoke and is attempting to quit. REVIEW OF SYSTEMS:   Constitutional: Denies fevers, chills or abnormal weight loss Eyes: Denies blurriness of vision Ears, nose, mouth, throat, and face: Denies mucositis or sore throat Respiratory: Denies cough, dyspnea or wheezes Cardiovascular: Denies palpitation, chest discomfort or lower extremity swelling Gastrointestinal:  Denies nausea, heartburn or change in bowel habits Skin: Denies abnormal skin rashes Lymphatics: Denies new  lymphadenopathy or easy bruising Neurological:Denies numbness, tingling or new weaknesses Behavioral/Psych: Mood is stable, no new changes  All other systems were reviewed with the patient and are negative.  I have reviewed the past medical history, past surgical history, social history and family history with the patient and they are unchanged from previous note.  ALLERGIES:  is allergic to codeine.  MEDICATIONS:  Current Outpatient Prescriptions  Medication Sig Dispense Refill  . albuterol (PROAIR HFA) 108 (90 BASE) MCG/ACT inhaler Inhale 1-2 puffs into the lungs daily as needed for wheezing or shortness of breath. 18 g 5  . amiodarone (PACERONE) 200 MG tablet Take 1 tablet (200 mg total) by mouth daily. 30 tablet 6  . Budesonide (PULMICORT FLEXHALER) 90 MCG/ACT inhaler Inhale 1 puff into the lungs daily.     . carvedilol (COREG) 12.5 MG tablet Take 12.5 mg by mouth 2 (two) times daily with a meal.     . folic acid (FOLVITE) 1 MG tablet Take 1 tablet (1 mg total) by mouth daily. 90 tablet 3  . furosemide (LASIX) 40 MG tablet Take 1 tablet (40 mg total) by mouth daily. 90 tablet 3  . hydroxyurea (HYDREA) 500 MG capsule Take 2 capsules (1,000 mg total) by mouth daily. May take with food to minimize GI side effects. (Patient taking differently: Take 1,500 mg by mouth daily. May take with food to minimize GI side effects.) 90 capsule 3  . hydroxyurea (HYDREA) 500 MG capsule take 3 capsules by mouth once daily with food TO MINIMIZE GI SIDE EFFECTS 90 capsule 3  . Multiple Vitamin (MULTIVITAMIN WITH MINERALS) TABS tablet Take 1 tablet by mouth daily. 60 tablet 2  . SUBOXONE 8-2 MG FILM     .  tiotropium (SPIRIVA HANDIHALER) 18 MCG inhalation capsule Place 1 capsule (18 mcg total) into inhaler and inhale daily. 30 capsule 12  . warfarin (COUMADIN) 2.5 MG tablet Take 1 tablet (2.5 mg total) by mouth daily. Except skip dose on Tuesdays. PHARMACIST NOTE: DOSE AND TABLET STRENGTH CHANGE. PLEASE VOID  PREVIOUS RX ON FILE FOR WARFARIN. 25 tablet 3   No current facility-administered medications for this visit.    PHYSICAL EXAMINATION: ECOG PERFORMANCE STATUS: 1 - Symptomatic but completely ambulatory  Filed Vitals:   08/26/15 0828  BP: 133/61  Pulse: 65  Temp: 98.5 F (36.9 C)  Resp: 18   Filed Weights   08/26/15 0828  Weight: 212 lb (96.163 kg)    GENERAL:alert, no distress and comfortable. He is morbidly obese SKIN: skin color, texture, turgor are normal, no rashes or significant lesions EYES: normal, Conjunctiva are pink and non-injected, sclera clear OROPHARYNX:no exudate, no erythema and lips, buccal mucosa, and tongue normal . Poor dentition is noted NECK: supple, thyroid normal size, non-tender, without nodularity LYMPH:  no palpable lymphadenopathy in the cervical, axillary or inguinal LUNGS: clear to auscultation and percussion with normal breathing effort HEART: regular rate & rhythm and no murmurs and no lower extremity edema ABDOMEN:abdomen soft, non-tender and normal bowel sounds Musculoskeletal:no cyanosis of digits and no clubbing  NEURO: alert & oriented x 3 with fluent speech, no focal motor/sensory deficits  LABORATORY DATA:  I have reviewed the data as listed    Component Value Date/Time   NA 143 08/01/2015 1351   NA 139 02/26/2015 0700   NA 143 06/12/2013 1125   K 3.5 08/01/2015 1351   K 3.7 06/12/2013 1125   CL 100 08/01/2015 1351   CO2 26 08/01/2015 1351   CO2 32* 06/12/2013 1125   GLUCOSE 110* 08/01/2015 1351   GLUCOSE 94 02/26/2015 0700   GLUCOSE 92 06/12/2013 1125   BUN 22 08/01/2015 1351   BUN 22* 02/26/2015 0700   BUN 14.6 06/12/2013 1125   CREATININE 1.35* 08/01/2015 1351   CREATININE 1.05 08/30/2014 1628   CREATININE 0.9 06/12/2013 1125   CALCIUM 8.9 08/01/2015 1351   CALCIUM 8.9 06/12/2013 1125   PROT 6.8 02/25/2015 1311   PROT 6.9 06/12/2013 1125   ALBUMIN 3.1* 02/25/2015 1311   ALBUMIN 3.3* 06/12/2013 1125   AST 22  02/25/2015 1311   AST 21 06/12/2013 1125   ALT 18 02/25/2015 1311   ALT 28 06/12/2013 1125   ALKPHOS 75 02/25/2015 1311   ALKPHOS 112 06/12/2013 1125   BILITOT 0.7 02/25/2015 1311   BILITOT 0.82 06/12/2013 1125   GFRNONAA 54* 08/01/2015 1351   GFRNONAA 73 08/30/2014 1628   GFRAA 62 08/01/2015 1351   GFRAA 84 08/30/2014 1628    No results found for: SPEP, UPEP  Lab Results  Component Value Date   WBC 5.6 08/26/2015   NEUTROABS 3.3 08/26/2015   HGB 14.1 08/26/2015   HCT 41.7 08/26/2015   MCV 115.8* 08/26/2015   PLT 148 08/26/2015      Chemistry      Component Value Date/Time   NA 143 08/01/2015 1351   NA 139 02/26/2015 0700   NA 143 06/12/2013 1125   K 3.5 08/01/2015 1351   K 3.7 06/12/2013 1125   CL 100 08/01/2015 1351   CO2 26 08/01/2015 1351   CO2 32* 06/12/2013 1125   BUN 22 08/01/2015 1351   BUN 22* 02/26/2015 0700   BUN 14.6 06/12/2013 1125   CREATININE 1.35* 08/01/2015 1351  CREATININE 1.05 08/30/2014 1628   CREATININE 0.9 06/12/2013 1125      Component Value Date/Time   CALCIUM 8.9 08/01/2015 1351   CALCIUM 8.9 06/12/2013 1125   ALKPHOS 75 02/25/2015 1311   ALKPHOS 112 06/12/2013 1125   AST 22 02/25/2015 1311   AST 21 06/12/2013 1125   ALT 18 02/25/2015 1311   ALT 28 06/12/2013 1125   BILITOT 0.7 02/25/2015 1311   BILITOT 0.82 06/12/2013 1125      ASSESSMENT & PLAN:  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 He tolerated the hydroxyurea well.  However, last year, he developed mild pancytopenia. I reduced his hydroxyurea dose to 500 mg on Mondays, Wednesdays and Fridays and to take 1000 mg for the rest of the week and he labs are back to within normal limits I recommend we continue the same and I will see him back in 3 months with repeat history, physical examination and blood work   Chronic anticoagulation The patient denies any recent signs or symptoms of bleeding such as spontaneous epistaxis,  hematuria or hematochezia. he will continue current medical management. I recommend close follow-up with primary care doctor for medication adjustment.  Tobacco abuse I spent some time counseling the patient the importance of tobacco cessation. He is currently attempting to quit on his own       All questions were answered. The patient knows to call the clinic with any problems, questions or concerns. No barriers to learning was detected. I spent 15 minutes counseling the patient face to face. The total time spent in the appointment was 20 minutes and more than 50% was on counseling and review of test results     [facility], [place], MD 08/26/2015 8:45 AM

## 2015-08-27 NOTE — Progress Notes (Signed)
Carelink Summary Report / Loop Recorder 

## 2015-09-06 LAB — CUP PACEART REMOTE DEVICE CHECK: MDC IDC SESS DTM: 20170106073550

## 2015-09-06 NOTE — Progress Notes (Signed)
[facility] summary report received. Battery status OK. Normal device function. No new symptom episodes, tachy episodes, brady, or pause episodes. 1.3% AF, +Warfarin, V rates relatively well controlled. Monthly summary reports and ROV/PRN

## 2015-09-23 ENCOUNTER — Ambulatory Visit (INDEPENDENT_AMBULATORY_CARE_PROVIDER_SITE_OTHER): Payer: Medicare Other | Admitting: Pharmacist

## 2015-09-23 DIAGNOSIS — Z7901 Long term (current) use of anticoagulants: Secondary | ICD-10-CM

## 2015-09-23 DIAGNOSIS — I4891 Unspecified atrial fibrillation: Secondary | ICD-10-CM

## 2015-09-23 LAB — POCT INR: INR: 2

## 2015-09-23 NOTE — Progress Notes (Signed)
INTERNAL MEDICINE TEACHING ATTENDING ADDENDUM - Lisabeth Mian M.D  Duration- indefinite, Indication- afib, INR- therapeutic. Agree with pharmacy recommendations as outlined in their note.     

## 2015-09-23 NOTE — Progress Notes (Signed)
Anti-Coagulation Progress Note  [name] is a 70 y.o. male who is currently on an anti-coagulation regimen.    RECENT RESULTS: Recent results are below, the most recent result is correlated with a dose of 13.75 mg. per week: Lab Results  Component Value Date   INR 2.0 09/23/2015   INR 2.30 08/26/2015   INR 2.30 07/29/2015    ANTI-COAG DOSE: Anticoagulation Dose Instructions as of 09/23/2015      [name] Tue Wed Thu Fri Sat   New Dose 2.5 mg 1.25 mg 2.5 mg 2.5 mg 2.5 mg 1.25 mg 2.5 mg       ANTICOAG SUMMARY: Anticoagulation Episode Summary    Current INR goal 2.0-3.0  Next INR check 10/21/2015  INR from last check 2.0 (09/23/2015)  Weekly max dose   Target end date Indefinite  [facility]  Preferred lab   Send INR reminders to    Indications  Atrial fibrillation ([place]) [I48.91]        Comments         ANTICOAG TODAY: Anticoagulation Summary as of 09/23/2015    INR goal 2.0-3.0  Selected INR 2.0 (09/23/2015)  Next INR check 10/21/2015  Target end date Indefinite   Indications  Atrial fibrillation ([place]) [I48.91]      Anticoagulation Episode Summary    [facility] check [facility]   Preferred lab    Send INR reminders to    Comments       PATIENT INSTRUCTIONS: Patient Instructions  Patient instructed to take medications as defined in the Anti-coagulation Track section of this encounter.  Patient instructed to take today's dose.  Patient verbalized understanding of these instructions.       FOLLOW-UP Return in 4 weeks (on 10/21/2015) for Follow up INR at 0900h.  [name], III Pharm.D., CACP

## 2015-09-23 NOTE — Patient Instructions (Signed)
Patient instructed to take medications as defined in the Anti-coagulation Track section of this encounter.  Patient instructed to take today's dose.  Patient verbalized understanding of these instructions.    

## 2015-09-24 ENCOUNTER — Ambulatory Visit (INDEPENDENT_AMBULATORY_CARE_PROVIDER_SITE_OTHER): Payer: Medicare Other | Admitting: *Deleted

## 2015-09-24 DIAGNOSIS — R55 Syncope and collapse: Secondary | ICD-10-CM

## 2015-09-26 ENCOUNTER — Inpatient Hospital Stay (HOSPITAL_COMMUNITY)
Admission: EM | Admit: 2015-09-26 | Discharge: 2015-09-27 | DRG: 190 | Disposition: A | Discharge status: Home / Self Care | Payer: Medicare Other | Attending: Infectious Disease | Admitting: Infectious Disease

## 2015-09-26 ENCOUNTER — Other Ambulatory Visit: Payer: Self-pay

## 2015-09-26 ENCOUNTER — Encounter (HOSPITAL_COMMUNITY): Payer: Self-pay | Admitting: Emergency Medicine

## 2015-09-26 ENCOUNTER — Emergency Department (HOSPITAL_COMMUNITY): Payer: Medicare Other

## 2015-09-26 DIAGNOSIS — Z7951 Long term (current) use of inhaled steroids: Secondary | ICD-10-CM

## 2015-09-26 DIAGNOSIS — I429 Cardiomyopathy, unspecified: Secondary | ICD-10-CM | POA: Diagnosis present

## 2015-09-26 DIAGNOSIS — J069 Acute upper respiratory infection, unspecified: Secondary | ICD-10-CM | POA: Diagnosis present

## 2015-09-26 DIAGNOSIS — I4891 Unspecified atrial fibrillation: Secondary | ICD-10-CM | POA: Diagnosis present

## 2015-09-26 DIAGNOSIS — Z8249 Family history of ischemic heart disease and other diseases of the circulatory system: Secondary | ICD-10-CM | POA: Diagnosis not present

## 2015-09-26 DIAGNOSIS — E669 Obesity, unspecified: Secondary | ICD-10-CM | POA: Diagnosis present

## 2015-09-26 DIAGNOSIS — I110 Hypertensive heart disease with heart failure: Secondary | ICD-10-CM | POA: Diagnosis present

## 2015-09-26 DIAGNOSIS — Z79899 Other long term (current) drug therapy: Secondary | ICD-10-CM | POA: Diagnosis not present

## 2015-09-26 DIAGNOSIS — J9601 Acute respiratory failure with hypoxia: Secondary | ICD-10-CM | POA: Diagnosis present

## 2015-09-26 DIAGNOSIS — J9602 Acute respiratory failure with hypercapnia: Secondary | ICD-10-CM | POA: Diagnosis present

## 2015-09-26 DIAGNOSIS — I5033 Acute on chronic diastolic (congestive) heart failure: Secondary | ICD-10-CM | POA: Diagnosis present

## 2015-09-26 DIAGNOSIS — D45 Polycythemia vera: Secondary | ICD-10-CM | POA: Diagnosis present

## 2015-09-26 DIAGNOSIS — I10 Essential (primary) hypertension: Secondary | ICD-10-CM | POA: Diagnosis present

## 2015-09-26 DIAGNOSIS — J441 Chronic obstructive pulmonary disease with (acute) exacerbation: Principal | ICD-10-CM | POA: Diagnosis present

## 2015-09-26 DIAGNOSIS — Z833 Family history of diabetes mellitus: Secondary | ICD-10-CM

## 2015-09-26 DIAGNOSIS — R0600 Dyspnea, unspecified: Secondary | ICD-10-CM

## 2015-09-26 DIAGNOSIS — F17210 Nicotine dependence, cigarettes, uncomplicated: Secondary | ICD-10-CM | POA: Diagnosis present

## 2015-09-26 DIAGNOSIS — E872 Acidosis: Secondary | ICD-10-CM | POA: Diagnosis not present

## 2015-09-26 DIAGNOSIS — Z7901 Long term (current) use of anticoagulants: Secondary | ICD-10-CM

## 2015-09-26 DIAGNOSIS — Z9114 Patient's other noncompliance with medication regimen: Secondary | ICD-10-CM | POA: Diagnosis not present

## 2015-09-26 DIAGNOSIS — R7989 Other specified abnormal findings of blood chemistry: Secondary | ICD-10-CM | POA: Insufficient documentation

## 2015-09-26 DIAGNOSIS — I509 Heart failure, unspecified: Secondary | ICD-10-CM

## 2015-09-26 DIAGNOSIS — I5022 Chronic systolic (congestive) heart failure: Secondary | ICD-10-CM | POA: Diagnosis not present

## 2015-09-26 DIAGNOSIS — Z6834 Body mass index (BMI) 34.0-34.9, adult: Secondary | ICD-10-CM

## 2015-09-26 DIAGNOSIS — F1120 Opioid dependence, uncomplicated: Secondary | ICD-10-CM | POA: Diagnosis present

## 2015-09-26 DIAGNOSIS — I480 Paroxysmal atrial fibrillation: Secondary | ICD-10-CM | POA: Diagnosis present

## 2015-09-26 DIAGNOSIS — Z720 Tobacco use: Secondary | ICD-10-CM | POA: Diagnosis present

## 2015-09-26 HISTORY — DX: Polycythemia vera: D45

## 2015-09-26 LAB — I-STAT ARTERIAL BLOOD GAS, ED
Acid-base deficit: 4 mmol/L — ABNORMAL HIGH (ref 0.0–2.0)
Bicarbonate: 24.2 mEq/L — ABNORMAL HIGH (ref 20.0–24.0)
O2 SAT: 99 %
PCO2 ART: 55.3 mmHg — AB (ref 35.0–45.0)
PH ART: 7.249 — AB (ref 7.350–7.450)
PO2 ART: 158 mmHg — AB (ref 80.0–100.0)
Patient temperature: 98.7
TCO2: 26 mmol/L (ref 0–100)

## 2015-09-26 LAB — INFLUENZA PANEL BY PCR (TYPE A & B)
H1N1FLUPCR: NOT DETECTED
INFLBPCR: NEGATIVE
Influenza A By PCR: NEGATIVE

## 2015-09-26 LAB — URINE MICROSCOPIC-ADD ON: Squamous Epithelial / LPF: NONE SEEN

## 2015-09-26 LAB — URINALYSIS, ROUTINE W REFLEX MICROSCOPIC
BILIRUBIN URINE: NEGATIVE
GLUCOSE, UA: NEGATIVE mg/dL
KETONES UR: NEGATIVE mg/dL
Leukocytes, UA: NEGATIVE
Nitrite: NEGATIVE
PH: 5 (ref 5.0–8.0)
PROTEIN: 30 mg/dL — AB
Specific Gravity, Urine: 1.011 (ref 1.005–1.030)

## 2015-09-26 LAB — CBC WITH DIFFERENTIAL/PLATELET
Basophils Absolute: 0 K/uL (ref 0.0–0.1)
Basophils Relative: 0 %
Eosinophils Absolute: 0.3 K/uL (ref 0.0–0.7)
Eosinophils Relative: 3 %
HCT: 47.1 % (ref 39.0–52.0)
Hemoglobin: 15.6 g/dL (ref 13.0–17.0)
Lymphocytes Relative: 43 %
Lymphs Abs: 5 K/uL — ABNORMAL HIGH (ref 0.7–4.0)
MCH: 40.2 pg — ABNORMAL HIGH (ref 26.0–34.0)
MCHC: 33.1 g/dL (ref 30.0–36.0)
MCV: 121.4 fL — ABNORMAL HIGH (ref 78.0–100.0)
Monocytes Absolute: 0.8 K/uL (ref 0.1–1.0)
Monocytes Relative: 7 %
Neutro Abs: 5.5 K/uL (ref 1.7–7.7)
Neutrophils Relative %: 47 %
Platelets: 188 K/uL (ref 150–400)
RBC: 3.88 MIL/uL — ABNORMAL LOW (ref 4.22–5.81)
RDW: 14.5 % (ref 11.5–15.5)
WBC: 11.6 K/uL — ABNORMAL HIGH (ref 4.0–10.5)

## 2015-09-26 LAB — PROCALCITONIN: Procalcitonin: <0.1 ng/mL

## 2015-09-26 LAB — PROTIME-INR
INR: 1.83 — ABNORMAL HIGH (ref 0.00–1.49)
Prothrombin Time: 21.1 s — ABNORMAL HIGH (ref 11.6–15.2)

## 2015-09-26 LAB — COMPREHENSIVE METABOLIC PANEL WITH GFR
ALT: 27 U/L (ref 17–63)
AST: 50 U/L — ABNORMAL HIGH (ref 15–41)
Albumin: 3 g/dL — ABNORMAL LOW (ref 3.5–5.0)
Alkaline Phosphatase: 103 U/L (ref 38–126)
Anion gap: 12 (ref 5–15)
BUN: 15 mg/dL (ref 6–20)
CO2: 24 mmol/L (ref 22–32)
Calcium: 8.6 mg/dL — ABNORMAL LOW (ref 8.9–10.3)
Chloride: 107 mmol/L (ref 101–111)
Creatinine, Ser: 1.46 mg/dL — ABNORMAL HIGH (ref 0.61–1.24)
GFR calc Af Amer: 55 mL/min — ABNORMAL LOW
GFR calc non Af Amer: 48 mL/min — ABNORMAL LOW
Glucose, Bld: 216 mg/dL — ABNORMAL HIGH (ref 65–99)
Potassium: 3.7 mmol/L (ref 3.5–5.1)
Sodium: 143 mmol/L (ref 135–145)
Total Bilirubin: 0.7 mg/dL (ref 0.3–1.2)
Total Protein: 6.6 g/dL (ref 6.5–8.1)

## 2015-09-26 LAB — BRAIN NATRIURETIC PEPTIDE: B NATRIURETIC PEPTIDE 5: 441.4 pg/mL — AB (ref 0.0–100.0)

## 2015-09-26 LAB — RAPID URINE DRUG SCREEN, HOSP PERFORMED
Amphetamines: NOT DETECTED
Barbiturates: NOT DETECTED
Benzodiazepines: NOT DETECTED
Cocaine: NOT DETECTED
Opiates: POSITIVE — AB
Tetrahydrocannabinol: NOT DETECTED

## 2015-09-26 LAB — I-STAT CG4 LACTIC ACID, ED
Lactic Acid, Venous: 7.17 mmol/L (ref 0.5–2.0)
Lactic Acid, Venous: 1.78 mmol/L (ref 0.5–2.0)

## 2015-09-26 LAB — APTT: APTT: 29 s (ref 24–37)

## 2015-09-26 LAB — I-STAT TROPONIN, ED: Troponin i, poc: 0.01 ng/mL (ref 0.00–0.08)

## 2015-09-26 LAB — MRSA PCR SCREENING: MRSA by PCR: NEGATIVE

## 2015-09-26 LAB — TECHNOLOGIST SMEAR REVIEW

## 2015-09-26 MED ORDER — BUDESONIDE 0.5 MG/2ML IN SUSP
0.2500 mg | Freq: Two times a day (BID) | RESPIRATORY_TRACT | Status: DC
  Administered 2015-09-26 – 2015-09-27 (×2): 0.25 mg via RESPIRATORY_TRACT
  Filled 2015-09-26 (×2): qty 2

## 2015-09-26 MED ORDER — METHYLPREDNISOLONE SODIUM SUCC 125 MG IJ SOLR
125.0000 mg | Freq: Once | INTRAMUSCULAR | Status: AC
  Administered 2015-09-26: 125 mg via INTRAVENOUS
  Filled 2015-09-26: qty 2

## 2015-09-26 MED ORDER — DEXTROSE 5 % IV SOLN
500.0000 mg | Freq: Once | INTRAVENOUS | Status: AC
  Administered 2015-09-26: 500 mg via INTRAVENOUS
  Filled 2015-09-26: qty 500

## 2015-09-26 MED ORDER — ALBUTEROL SULFATE (2.5 MG/3ML) 0.083% IN NEBU: 5.0000 mg | INHALATION_SOLUTION | RESPIRATORY_TRACT | Status: DC | PRN

## 2015-09-26 MED ORDER — DEXTROSE 5 % IV SOLN: 500.0000 mg | INTRAVENOUS | Status: DC

## 2015-09-26 MED ORDER — HYDROXYUREA 500 MG PO CAPS
500.0000 mg | ORAL_CAPSULE | ORAL | Status: DC
  Administered 2015-09-27: 500 mg via ORAL
  Filled 2015-09-26 (×2): qty 1

## 2015-09-26 MED ORDER — WARFARIN - PHARMACIST DOSING INPATIENT: Freq: Every day | Status: DC

## 2015-09-26 MED ORDER — HYDROXYUREA 500 MG PO CAPS
1000.0000 mg | ORAL_CAPSULE | ORAL | Status: DC
  Administered 2015-09-26: 1000 mg via ORAL
  Filled 2015-09-26: qty 2

## 2015-09-26 MED ORDER — FUROSEMIDE 10 MG/ML IJ SOLN
40.0000 mg | Freq: Once | INTRAMUSCULAR | Status: AC
Start: 2015-09-26 — End: 2015-09-26
  Administered 2015-09-26: 40 mg via INTRAVENOUS
  Filled 2015-09-26: qty 4

## 2015-09-26 MED ORDER — ALBUTEROL (5 MG/ML) CONTINUOUS INHALATION SOLN: 10.0000 mg/h | INHALATION_SOLUTION | RESPIRATORY_TRACT | Status: DC

## 2015-09-26 MED ORDER — AMIODARONE HCL 200 MG PO TABS
200.0000 mg | ORAL_TABLET | Freq: Every day | ORAL | Status: DC
  Administered 2015-09-26 – 2015-09-27 (×2): 200 mg via ORAL
  Filled 2015-09-26 (×2): qty 1

## 2015-09-26 MED ORDER — SODIUM CHLORIDE 0.9% FLUSH
3.0000 mL | Freq: Two times a day (BID) | INTRAVENOUS | Status: DC
  Administered 2015-09-27 (×2): 3 mL via INTRAVENOUS

## 2015-09-26 MED ORDER — IPRATROPIUM-ALBUTEROL 0.5-2.5 (3) MG/3ML IN SOLN
3.0000 mL | RESPIRATORY_TRACT | Status: DC
  Administered 2015-09-26 – 2015-09-27 (×6): 3 mL via RESPIRATORY_TRACT
  Filled 2015-09-26 (×7): qty 3

## 2015-09-26 MED ORDER — DOXYCYCLINE HYCLATE 100 MG PO TABS
100.0000 mg | ORAL_TABLET | Freq: Two times a day (BID) | ORAL | Status: DC
  Administered 2015-09-27: 100 mg via ORAL
  Filled 2015-09-26: qty 1

## 2015-09-26 MED ORDER — CARVEDILOL 12.5 MG PO TABS
12.5000 mg | ORAL_TABLET | Freq: Two times a day (BID) | ORAL | Status: DC
  Administered 2015-09-26 – 2015-09-27 (×3): 12.5 mg via ORAL
  Filled 2015-09-26 (×4): qty 1

## 2015-09-26 MED ORDER — SENNOSIDES-DOCUSATE SODIUM 8.6-50 MG PO TABS
1.0000 | ORAL_TABLET | Freq: Every evening | ORAL | Status: DC | PRN
  Administered 2015-09-26: 1 via ORAL
  Filled 2015-09-26: qty 1

## 2015-09-26 MED ORDER — DEXTROSE 5 % IV SOLN: 1.0000 g | INTRAVENOUS | Status: DC

## 2015-09-26 MED ORDER — WARFARIN SODIUM 3 MG PO TABS
3.0000 mg | ORAL_TABLET | Freq: Once | ORAL | Status: AC
  Administered 2015-09-26: 3 mg via ORAL
  Filled 2015-09-26 (×2): qty 1

## 2015-09-26 MED ORDER — FOLIC ACID 1 MG PO TABS
1.0000 mg | ORAL_TABLET | Freq: Every day | ORAL | Status: DC
  Administered 2015-09-26 – 2015-09-27 (×2): 1 mg via ORAL
  Filled 2015-09-26 (×2): qty 1

## 2015-09-26 MED ORDER — ONDANSETRON HCL 4 MG/2ML IJ SOLN: 4.0000 mg | Freq: Four times a day (QID) | INTRAMUSCULAR | Status: DC | PRN

## 2015-09-26 MED ORDER — METHYLPREDNISOLONE SODIUM SUCC 125 MG IJ SOLR
60.0000 mg | Freq: Four times a day (QID) | INTRAMUSCULAR | Status: DC
  Administered 2015-09-26 – 2015-09-27 (×3): 60 mg via INTRAVENOUS
  Filled 2015-09-26 (×3): qty 2

## 2015-09-26 MED ORDER — ONDANSETRON HCL 4 MG PO TABS: 4.0000 mg | ORAL_TABLET | Freq: Four times a day (QID) | ORAL | Status: DC | PRN

## 2015-09-26 MED ORDER — BUPRENORPHINE HCL 8 MG SL SUBL
8.0000 mg | SUBLINGUAL_TABLET | Freq: Every day | SUBLINGUAL | Status: DC
  Administered 2015-09-26 – 2015-09-27 (×2): 8 mg via SUBLINGUAL
  Filled 2015-09-26 (×2): qty 1

## 2015-09-26 MED ORDER — ACETAMINOPHEN 325 MG PO TABS
650.0000 mg | ORAL_TABLET | Freq: Four times a day (QID) | ORAL | Status: DC | PRN
Start: 2015-09-26 — End: 2015-09-27

## 2015-09-26 MED ORDER — ACETAMINOPHEN 650 MG RE SUPP
650.0000 mg | Freq: Four times a day (QID) | RECTAL | Status: DC | PRN
Start: 2015-09-26 — End: 2015-09-27

## 2015-09-26 MED ORDER — DEXTROSE 5 % IV SOLN
1.0000 g | Freq: Once | INTRAVENOUS | Status: AC
  Administered 2015-09-26: 1 g via INTRAVENOUS
  Filled 2015-09-26: qty 10

## 2015-09-26 NOTE — Progress Notes (Signed)
Pt came in via EMS  On CPAP w/ SOB, cool & diaphoretic w/ increased WOB.  Placed pt on BIPAP. Pt tol well so far.  Sat 99% on 100% fio2.

## 2015-09-26 NOTE — ED Notes (Addendum)
Mrs. [name] -- wife-- [phone]-- updated on pt's condition. Will be here around 3pm.

## 2015-09-26 NOTE — Progress Notes (Signed)
ANTICOAGULATION CONSULT NOTE - Initial Consult  Pharmacy Consult for Coumadin  Indication: atrial fibrillation  Allergies  Allergen Reactions  . Codeine Rash    Patient Measurements: Weight: 212 lb (96.163 kg)  Vital Signs: Temp: 98.7 F (37.1 C) (03/09 0841) Temp Source: Rectal (03/09 0841) BP: 168/103 mmHg (03/09 0841) Pulse Rate: 109 (03/09 0841)  Labs:  Recent Labs  09/23/15 1026 09/26/15 0823 09/26/15 0923  HGB  --  15.6  --   HCT  --  47.1  --   PLT  --  188  --   LABPROT  --   --  21.1*  INR 2.0  --  1.83*  CREATININE  --   --  1.46*    Estimated Creatinine Clearance: 52.6 mL/min (by C-G formula based on Cr of 1.46).   Medical History: Past Medical History  Diagnosis Date  . Cardiomyopathy     EF55% 11/14<<35%   . Substance abuse     alcohol  . Hypertension   . Atrial fibrillation /flutter     hx of flutter ablation 2/12  . Shortness of breath     "all the time lately" (12/14/2012)  . GERD (gastroesophageal reflux disease)   . Hepatitis     "think I had that once; a long time ago; from dirty needles I think" (12/14/2012)  . Primary polycythemia ([place]) 06/12/2013  . Leukocytosis, unspecified 06/12/2013  . Myeloproliferative neoplasm ([place]) 08/15/2013  . Obesity   . Syncope   . Cancer ([place])   . CHF (congestive heart failure) ([place])   . Renal insufficiency   . COPD (chronic obstructive pulmonary disease) ([facility])     Medications:   (Not in a hospital admission)  Assessment: 77 YOM who presented with respiratory distress. Pharmacy consulted to resume home Coumadin for Afib. CHADSVASc score 3 (Age, CHF, HTN). Patient takes Coumadin 2.5 mg daily at home. INR on admission is mildly sub-therapeutic at 1.83. Patient states he did not take his Coumadin today.   Goal of Therapy:  INR 2-3 Monitor platelets by anticoagulation protocol: Yes   Plan:  -Give Coumadin 3 mg today. Likely could resume home dose tomorrow if INR is therapeutic -Daily PT/INR    Albertina Parr, PharmD., BCPS Clinical Pharmacist Pager [phone]

## 2015-09-26 NOTE — H&P (Signed)
Date: 09/26/2015               Patient Name:  [name] MRN: [id number]  DOB: 07/23/1945 Age / Sex: 70 y.o., male   PCP: Jule Ser, DO         Medical Service: Internal Medicine Teaching Service         Attending Physician: Dr. Truman Hayward, MD    First Contact: Dr. Merrilyn Puma Pager: [phone]  Second Contact: Dr. Hulen Luster Pager: [phone]       After Hours (After 5p/  First Contact Pager: [phone]  weekends / holidays): Second Contact Pager: [phone]   Chief Complaint: shortness of breath  History of Present Illness:   [name] is a 70 year old man with past medical history of polycythemia vera, hypertension, GOLD Stage 3 COPD, PAF on coumadin, chronic grade 1 diastolic CHF, history of heroin abuse, and tobacco use who presents with shortness of breath.   He reports being in his normal state of health until about 1 week ago when he began having nasal congestion, rhinorrhea, productive cough, wheezing, and shortness of breath which worsened this morning. He denies fever, chills, or chest tightness/pain. His grandchildren have been sick recently. He has GOLD Stage 3 COPD (last PFTs in 2015 with FEV 1 47%)  and is compliant with taking spiriva and pulmicort. He is not on home oxygen. He has been using his albuterol inhaler 1-2 times a day for the past few days but normally does not use it. His last exacerbation was in April of 2016 and has history of intubation in 2015. He smokes 2-3 cigarettes daily (50 year history) and is trying to cut back.   He has chronic diastolic CHF with last echo on 06/07/14 with grade 1 diastolic dysfunction. He reports running out of his lasix (40 mg daily) for the past week or so. He denies orthopnea, PND, or LE edema.    He denies alcohol or drug use. He has history of heroine abuse and reports taking suboxone daily. He reports having received flu shot from his pharmacy this year and has had PCV13 and PSV23 vaccinations.     On EMS arrival he was found to be hypoxic to 88% on CPAP. He was placed on Bipap with ABG revealing pH 7.2 CO2 55. He was also given breathing treatments in route to ED. Chest xray revealed mild interstitial edema with no infiltrate or consolidation. His symptoms improved significantly with bipap and now placed on 2L oxygen via [place]. He reports feeling much better. Lactic acid level was initially elevated at 7.17 which then normalized without IVF's. He was started on ceftriaxone and azithromycin for CAP in the ED.    Meds:  No current facility-administered medications on file prior to encounter.   Current Outpatient Prescriptions on File Prior to Encounter  Medication Sig Dispense Refill  . albuterol (PROAIR HFA) 108 (90 BASE) MCG/ACT inhaler Inhale 1-2 puffs into the lungs daily as needed for wheezing or shortness of breath. 18 g 5  . amiodarone (PACERONE) 200 MG tablet Take 1 tablet (200 mg total) by mouth daily. 30 tablet 6  . Budesonide (PULMICORT FLEXHALER) 90 MCG/ACT inhaler Inhale 1 puff into the lungs daily.     . carvedilol (COREG) 12.5 MG tablet Take 12.5 mg by mouth 2 (two) times daily with a meal.     . folic acid (FOLVITE) 1 MG tablet Take 1 tablet (1 mg total) by mouth daily. 90 tablet 3  .  furosemide (LASIX) 40 MG tablet Take 1 tablet (40 mg total) by mouth daily. 90 tablet 3  . hydroxyurea (HYDREA) 500 MG capsule Take 2 capsules (1,000 mg total) by mouth daily. May take with food to minimize GI side effects. (Patient taking differently: Take 1,500 mg by mouth daily. May take with food to minimize GI side effects.) 90 capsule 3  . hydroxyurea (HYDREA) 500 MG capsule take 3 capsules by mouth once daily with food TO MINIMIZE GI SIDE EFFECTS 90 capsule 3  . Multiple Vitamin (MULTIVITAMIN WITH MINERALS) TABS tablet Take 1 tablet by mouth daily. 60 tablet 2  . SUBOXONE 8-2 MG FILM     . tiotropium (SPIRIVA HANDIHALER) 18 MCG inhalation capsule Place 1 capsule (18 mcg total) into inhaler  and inhale daily. 30 capsule 12  . warfarin (COUMADIN) 2.5 MG tablet Take 1 tablet (2.5 mg total) by mouth daily. Except skip dose on Tuesdays. PHARMACIST NOTE: DOSE AND TABLET STRENGTH CHANGE. PLEASE VOID PREVIOUS RX ON FILE FOR WARFARIN. 25 tablet 3     Allergies: Allergies as of 09/26/2015 - Review Complete 09/26/2015  Allergen Reaction Noted  . Codeine Rash 09/30/2010   Past Medical History  Diagnosis Date  . Cardiomyopathy     EF55% 11/14<<35%   . Substance abuse     alcohol  . Hypertension   . Atrial fibrillation /flutter     hx of flutter ablation 2/12  . Shortness of breath     "all the time lately" (12/14/2012)  . GERD (gastroesophageal reflux disease)   . Hepatitis     "think I had that once; a long time ago; from dirty needles I think" (12/14/2012)  . Primary polycythemia ([place]) 06/12/2013  . Leukocytosis, unspecified 06/12/2013  . 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 Obesity   . Syncope   . Cancer ([place])   . CHF (congestive heart failure) ([place])   . Renal insufficiency   . COPD (chronic obstructive pulmonary disease) [facility])    Past Surgical History  Procedure Laterality Date  . Hemorrhoid surgery  1970's  . Cardiac electrophysiology mapping and ablation  08/2010    [name] 09/07/2010 (12/14/2012)  . Loop recorder implant  08-22-2013    MDT LinQ implanted by Dr Caryl Comes for syncope  . Loop recorder implant N/A 08/23/2013    Procedure: LOOP RECORDER IMPLANT;  Surgeon: Deboraha Sprang, MD;  Location[facility] C[facility] LAB;  Service: Cardiovascular;  Laterality: N/A;  . Colonoscopy      15-20 years ago had colon in [place]   Family History  Problem Relation Age of Onset  . Diabetes Mother   . Hypertension Mother   . Cirrhosis Father   . Alcohol abuse Father   . Colon cancer Neg Hx   . Rectal cancer Neg Hx   . Stomach cancer Neg Hx    Social History   Social History  . Marital Status: Single    Spouse Name: N/A  . Number of Children: N/A  . Years of Education: N/A    Occupational History  . Retired    Social History Main Topics  . Smoking status: Current Every Day Smoker -- 0.20 packs/day for 40 years    Types: Cigarettes  . Smokeless tobacco: Never Used     Comment: patient smoked 1-2 cigs x 50 years, now smoking approx 4-5 cigs per day x 1 year  . Alcohol Use: 0.0 oz/week    0 Standard drinks or equivalent per week  . Drug Use: Yes  Special: "Crack" cocaine, Cocaine, Heroin     Comment: 03/20/2013 "been clean for awhile"; endorses using heroin "all my life" up until about a year ago-- currently denies any drug use-- on suboxone.   . Sexual Activity: Not Currently   Other Topics Concern  . Not on file   Social History Narrative   Moved here from [place]. Lives with common law wife and grandchildren. He is retired from "general labor."    Review of Systems: Review of Systems  Constitutional: Negative for fever, chills and malaise/fatigue.  HENT: Positive for congestion. Negative for sore throat.        Rhinorrhea  Eyes: Negative for blurred vision.  Respiratory: Positive for cough, sputum production, shortness of breath and wheezing. Negative for hemoptysis.   Cardiovascular: Negative for chest pain, palpitations, orthopnea, leg swelling and PND.  Gastrointestinal: Negative for nausea, vomiting, abdominal pain, diarrhea, constipation and blood in stool.  Genitourinary: Negative for dysuria, urgency, frequency and hematuria.  Musculoskeletal: Negative for myalgias.  Skin: Negative for rash.  Neurological: Negative for dizziness and headaches.  Psychiatric/Behavioral: Negative for substance abuse.      Physical Exam: Blood pressure 168/103, pulse 109, temperature 98.7 F (37.1 C), temperature source Rectal, resp. rate 26, weight 212 lb (96.163 kg), SpO2 95 %.  Physical Exam  Constitutional: He is oriented to person, place, and time. He appears well-developed and well-nourished. No distress.  HENT:  Head: Normocephalic and  atraumatic.  Right Ear: External ear normal.  Left Ear: External ear normal.  Nose: Nose normal.  Mouth/Throat: Oropharynx is clear and moist. No oropharyngeal exudate.  Eyes: Conjunctivae and EOM are normal. Pupils are equal, round, and reactive to light. Right eye exhibits no discharge. Left eye exhibits no discharge. No scleral icterus.  Neck: Normal range of motion. Neck supple.  Cardiovascular: Normal rate, regular rhythm and normal heart sounds.   Pulmonary/Chest: No respiratory distress.  Coarse breath sounds with ronchi and expiratory wheezing. Moderate air-flow.  Abdominal: Soft. Bowel sounds are normal. He exhibits distension. There is no tenderness. There is no rebound and no guarding.  Musculoskeletal: Normal range of motion. He exhibits edema (trace b/l LE). He exhibits no tenderness.  Neurological: He is alert and oriented to person, place, and time.  Skin: Skin is warm and dry. No rash noted. He is not diaphoretic. No erythema. No pallor.  Psychiatric: He has a normal mood and affect. His behavior is normal. Judgment and thought content normal.     Lab results: Basic Metabolic Panel:  Recent Labs  09/26/15 0923  NA 143  K 3.7  CL 107  CO2 24  GLUCOSE 216*  BUN 15  CREATININE 1.46*  CALCIUM 8.6*   Liver Function Tests:  Recent Labs  09/26/15 0923  AST 50*  ALT 27  ALKPHOS 103  BILITOT 0.7  PROT 6.6  ALBUMIN 3.0*   CBC:  Recent Labs  09/26/15 0823  WBC 11.6*  NEUTROABS 5.5  HGB 15.6  HCT 47.1  MCV 121.4*  PLT 188   Coagulation:  Recent Labs  09/26/15 0923  LABPROT 21.1*  INR 1.83*    Imaging results:  Dg Chest Portable 1 View  09/26/2015  CLINICAL DATA:  Shortness of Breath EXAM: PORTABLE CHEST 1 VIEW COMPARISON:  12/04/2014 FINDINGS: Cardiomediastinal silhouette is stable. Mild perihilar bilateral interstitial prominence left greater than right. Mild basilar atelectasis. Findings suspicious for mild interstitial edema. Interstitial  pneumonitis cannot be excluded. IMPRESSION: Mild perihilar bilateral interstitial prominence left greater than right. Findings  suspicious for interstitial edema or bilateral pneumonitis. Mild basilar atelectasis. Electronically Signed   By: Lahoma Crocker M.D.   On: 09/26/2015 08:46    Other results: EKG:   Vent. rate 91 BPM PR interval 205 ms QRS duration 91 ms QT/QTc 378/465 ms P-R-T axes 12 8 61  Sinus rhythm Left atrial enlargement  Assessment & Plan by Problem:  Acute hypoxic/hypercapnic respiratory failure in setting of GOLD Stage 3 COPD exacerbation - Pt with 1-week history of URI symptoms, dyspnea, wheezing, and productive cough found to be hypoxic on EMS arrival requiring NIMV now with SpO2 92% on 2L of oxygen. CXR with mild interstitial edema with no pneumonia. Trigger of COPD exacerbation most likely viral URI in setting of recent sick contacts. Lactic acid most likely falsely elevated on admission. Pt reports compliance with maintenance inhaler therapy.  -Admit to SDU -Bipap as needed -Oxygen therapy to keep SpO2 88-90% -Solumedrol 60 mg Q 6 hr (received 125 mg in ED) -Duoneb Q 4 hr while awake and albuterol nebulizer 5 mg Q 2 hr PRN -Pulmicort nebulizer 0.25 mg BID, hold home spiriva with duoneb use -Change ceftriaxone and azithromycin to doxycyline 100 mg BID (less coumadin interaction) in setting of sputum production -Obtain flu PCR, if positive start tamiflu for 5 days -Follow-up blood cultures and obtain sputum culture and gram stain  -Pt is already vaccinated against PCV13 and PSV23 -Pt counseled on tobacco cessation  -Consider pulmonary rehab on discharge and repeating PFT's (has been >1 yr)   Acute on chronic diastolic CHF - Pt with elevated BNP 441 with mild interstitial edema and noncompliant with lasix 40 mg daily for past week. Last echo on 06/07/14 with grade 1 diastolic dysfunction.  -IV lasix 40 mg x 1 then reassess -Repeat CXR in AM -Monitor daily weights and  strict I & O's  -Monitor BMP -Continue home carvedilol 12.5 mg BID  PAF s/p ablation - Pt currently in normal sinus rhythm and rate. INR 1.83 on admission.  -Continue home amiodarone 200 mg daily -Continue home carvedilol 12.5 mg BID  -Coumadin per pharmacy -Monitor INR daily   Hypertension - Currently normotensive.  -Continue home carvedilol 12.5 mg BID  Polycythemia Vera JAK2 mutation positive - Hg 15.6 above baseline 11-12. Pt at home on hydroxyurea.  -Follow-up smear review  -Monitor CBC -Continue home hydroxyurea 500 mg MWF and 1000 mg TTSS -Continue folic acid 1 mg daily  -Pt follows with Dr. Alvy Bimler   History of heroin abuse - Pt currently denies use. Pt at home on SL suboxone 8-12 mg daily. -Follow-up UDS -Continue subutex 8 mg daily (formulary change)   Diet: Regular  DVT Ppx: Coumadin  Code: Full   Dispo: Disposition is deferred at this time, awaiting improvement of current medical problems. Anticipated discharge in approximately 1-3 day(s).   The patient does have a current PCP Jule Ser, DO) and does not need an [facility] hospital follow-up appointment after discharge.  The patient does not have transportation limitations that hinder transportation to clinic appointments.  Signed: Juluis Mire, MD 09/26/2015, 10:33 AM

## 2015-09-26 NOTE — ED Notes (Signed)
Ordered diet tray 

## 2015-09-26 NOTE — ED Provider Notes (Signed)
CSN: [id number]     Arrival date & time 09/26/15  0815 History   First MD Initiated Contact with Patient 09/26/15 [phone]     Chief Complaint  Patient presents with  . Respiratory Distress     (Consider location/radiation/quality/duration/timing/severity/associated sxs/prior Treatment) HPI History limited due to restaurant distress. Patient with history of COPD and CHF presents with progressive shortness of breath for the past week. Per EMS patient had cough and URI symptoms. Found to be hypoxic and gasping for breath. Started on BiPAP. Also received 3 albuterol and 2 Atrovent treatments as well as nitroglycerin 2. Noted to be hypertensive in route Past Medical History  Diagnosis Date  . Cardiomyopathy     EF55% 11/14<<35%   . Substance abuse     alcohol  . Hypertension   . Atrial fibrillation /flutter     hx of flutter ablation 2/12  . Shortness of breath     "all the time lately" (12/14/2012)  . GERD (gastroesophageal reflux disease)   . Hepatitis     "think I had that once; a long time ago; from dirty needles I think" (12/14/2012)  . Primary polycythemia ([place]) 06/12/2013  . Leukocytosis, unspecified 06/12/2013  . Myeloproliferative neoplasm ([place]) 08/15/2013  . Obesity   . Syncope   . Cancer ([place])   . CHF (congestive heart failure) ([place])   . Renal insufficiency   . COPD (chronic obstructive pulmonary disease) [facility])    Past Surgical History  Procedure Laterality Date  . Hemorrhoid surgery  1970's  . Cardiac electrophysiology mapping and ablation  08/2010    [name] 09/07/2010 (12/14/2012)  . Loop recorder implant  08-22-2013    MDT LinQ implanted by Dr Caryl Comes for syncope  . Loop recorder implant N/A 08/23/2013    Procedure: LOOP RECORDER IMPLANT;  Surgeon: Deboraha Sprang, MD;  Location: [facility] CATH LAB;  Service: Cardiovascular;  Laterality: N/A;  . Colonoscopy      15-20 years ago had colon in [place]   Family History  Problem Relation Age of Onset  . Diabetes Mother   . Hypertension  Mother   . Cirrhosis Father   . Alcohol abuse Father   . Colon cancer Neg Hx   . Rectal cancer Neg Hx   . Stomach cancer Neg Hx    Social History  Substance Use Topics  . Smoking status: Current Every Day Smoker -- 0.20 packs/day for 40 years    Types: Cigarettes  . Smokeless tobacco: Never Used     Comment: patient smoked 1-2 cigs x 50 years, now smoking approx 4-5 cigs per day x 1 year  . Alcohol Use: 0.0 oz/week    0 Standard drinks or equivalent per week    Review of Systems  Unable to perform ROS: Acuity of condition      Allergies  Codeine  Home Medications   Prior to Admission medications   Medication Sig Start Date End Date Taking? Authorizing Provider  albuterol (PROAIR HFA) 108 (90 BASE) MCG/ACT inhaler Inhale 1-2 puffs into the lungs daily as needed for wheezing or shortness of breath. 05/28/14  Yes Nino Glow McLean-Scocozza, MD  amiodarone (PACERONE) 200 MG tablet Take 1 tablet (200 mg total) by mouth daily. 12/10/14  Yes Norman Herrlich, MD  Budesonide (PULMICORT FLEXHALER) 90 MCG/ACT inhaler Inhale 1 puff into the lungs daily.    Yes Historical Provider, MD  carvedilol (COREG) 12.5 MG tablet Take 12.5 mg by mouth daily.    Yes Historical Provider, MD  folic acid (FOLVITE) 1 MG tablet Take 1 tablet (1 mg total) by mouth daily. 07/29/15  Yes Jule Ser, DO  furosemide (LASIX) 40 MG tablet Take 1 tablet (40 mg total) by mouth daily. 07/29/15  Yes Jule Ser, DO  hydroxyurea (HYDREA) 500 MG capsule Take 2 capsules (1,000 mg total) by mouth daily. May take with food to minimize GI side effects. Patient taking differently: Take 500-1,000 mg by mouth daily. Take 1 capsule on M-W-F, and 2 capsule all other days 08/24/14  Yes Heath Lark, MD  SUBOXONE 8-2 MG FILM Take 1 Film by mouth daily.  08/21/15  Yes Historical Provider, MD  tiotropium (SPIRIVA HANDIHALER) 18 MCG inhalation capsule Place 1 capsule (18 mcg total) into inhaler and inhale daily. 05/03/14  Yes Kinnie Feil,  MD  warfarin (COUMADIN) 2.5 MG tablet Take 1 tablet (2.5 mg total) by mouth daily. Except skip dose on Tuesdays. PHARMACIST NOTE: DOSE AND TABLET STRENGTH CHANGE. PLEASE VOID PREVIOUS RX ON FILE FOR WARFARIN. Patient taking differently: Take 1.25-2.5 mg by mouth daily. Take 1 tablet all days except on Tues and Friday take 1/2 tablet 05/30/15  Yes Axel Filler, MD  hydroxyurea (HYDREA) 500 MG capsule take 3 capsules by mouth once daily with food TO MINIMIZE GI SIDE EFFECTS Patient not taking: Reported on 09/26/2015 03/11/15   Heath Lark, MD  Multiple Vitamin (MULTIVITAMIN WITH MINERALS) TABS tablet Take 1 tablet by mouth daily. Patient not taking: Reported on 09/26/2015 02/26/15   Burgess Estelle, MD   BP 108/65 mmHg  Pulse 54  Temp(Src) 98.7 F (37.1 C) (Rectal)  Resp 17  Ht 5\' 6"  (1.676 m)  Wt 216 lb (97.977 kg)  BMI 34.88 kg/m2  SpO2 96% Physical Exam  Constitutional: He appears well-developed and well-nourished. He appears distressed.  HENT:  Head: Normocephalic and atraumatic.  Mouth/Throat: Oropharynx is clear and moist.  Eyes: EOM are normal. Pupils are equal, round, and reactive to light.  Neck: Normal range of motion. Neck supple. No JVD present.  Cardiovascular: Normal rate and regular rhythm.   Pulmonary/Chest: He is in respiratory distress. He has rales.  Patient with rhonchorous breath sounds bilaterally. Increased work of breathing.  Abdominal: Soft. Bowel sounds are normal. He exhibits distension. He exhibits no mass. There is no tenderness. There is no rebound and no guarding.  Musculoskeletal: Normal range of motion. He exhibits edema. He exhibits no tenderness.  1+ bilateral pitting edema. No calf asymmetry.  Neurological: He is alert.  Patient follows simple commands. Moving all extremities.  Skin: Skin is warm and dry. No rash noted. No erythema.  Nursing note and vitals reviewed.   ED Course  Procedures (including critical care time) Labs Review Labs  Reviewed  CBC WITH DIFFERENTIAL/PLATELET - Abnormal; Notable for the following:    WBC 11.6 (*)    RBC 3.88 (*)    MCV 121.4 (*)    MCH 40.2 (*)    Lymphs Abs 5.0 (*)    All other components within normal limits  BRAIN NATRIURETIC PEPTIDE - Abnormal; Notable for the following:    B Natriuretic Peptide 441.4 (*)    All other components within normal limits  COMPREHENSIVE METABOLIC PANEL - Abnormal; Notable for the following:    Glucose, Bld 216 (*)    Creatinine, Ser 1.46 (*)    Calcium 8.6 (*)    Albumin 3.0 (*)    AST 50 (*)    GFR calc non Af Amer 48 (*)    GFR  calc Af Amer 55 (*)    All other components within normal limits  PROTIME-INR - Abnormal; Notable for the following:    Prothrombin Time 21.1 (*)    INR 1.83 (*)    All other components within normal limits  I-STAT CG4 LACTIC ACID, ED - Abnormal; Notable for the following:    Lactic Acid, Venous 7.17 (*)    All other components within normal limits  I-STAT ARTERIAL BLOOD GAS, ED - Abnormal; Notable for the following:    pH, Arterial 7.249 (*)    pCO2 arterial 55.3 (*)    pO2, Arterial 158.0 (*)    Bicarbonate 24.2 (*)    Acid-base deficit 4.0 (*)    All other components within normal limits  CULTURE, BLOOD (ROUTINE X 2)  CULTURE, BLOOD (ROUTINE X 2)  MRSA PCR SCREENING  CULTURE, EXPECTORATED SPUTUM-ASSESSMENT  GRAM STAIN  TECHNOLOGIST SMEAR REVIEW  APTT  BLOOD GAS, ARTERIAL  URINE RAPID DRUG SCREEN, HOSP PERFORMED  INFLUENZA PANEL BY PCR (TYPE A & B, H1N1)  HIV ANTIBODY (ROUTINE TESTING)  PROCALCITONIN  URINALYSIS, ROUTINE W REFLEX MICROSCOPIC (NOT AT [facility])  I-STAT TROPOININ, ED  I-STAT CG4 LACTIC ACID, ED  I-STAT CG4 LACTIC ACID, ED    Imaging Review Dg Chest Portable 1 View  09/26/2015  CLINICAL DATA:  Shortness of Breath EXAM: PORTABLE CHEST 1 VIEW COMPARISON:  12/04/2014 FINDINGS: Cardiomediastinal silhouette is stable. Mild perihilar bilateral interstitial prominence left greater than right. Mild  basilar atelectasis. Findings suspicious for mild interstitial edema. Interstitial pneumonitis cannot be excluded. IMPRESSION: Mild perihilar bilateral interstitial prominence left greater than right. Findings suspicious for interstitial edema or bilateral pneumonitis. Mild basilar atelectasis. Electronically Signed   By: Lahoma Crocker M.D.   On: 09/26/2015 08:46   I have personally reviewed and evaluated these images and lab results as part of my medical decision-making.   EKG Interpretation None      MDM   Final diagnoses:  COPD exacerbation (HCC)  Elevated lactic acid level  Acute on chronic congestive heart failure, unspecified congestive heart failure type [facility])    Patient with improved mental status and respiratory distress on BiPAP. Vital signs are stable. Patient did have initially elevated lactic acid. I have low suspicion for sepsis. Will repeat lactic acid level. Patient is covered for possible pneumonia though chest x-ray findings likely are more consistent with pulmonary edema. Sepsis fluid bolus was held due to the patient's history of CHF and questionable fluid overload status. Thus with internal medicine resident. We'll see patient in the emergency department and admit to step down bed.    Julianne Rice, MD 09/26/15 1213

## 2015-09-26 NOTE — ED Notes (Signed)
Lab called --stated the [facility] and PT INR are hemolyzed, will need to be redrawn.

## 2015-09-26 NOTE — Progress Notes (Signed)
Pt appears more comfortable on bipap now, no distress noted now.  Pt states his breathing is improving on bipap now.  ABG results given to J. Hedges PA, no new RT orders rec'd.  VSS, sat 100%.

## 2015-09-26 NOTE — ED Notes (Signed)
To ED via [facility] medic 62 from home with c/o resp distress-- received 3 treatments of albuteerol and 2 of atrovent, NTG x 2 sl, EMS was unable to obtain IV-- on arrival-- pt on CPAP, extremely diaphoretic, gasping resp,

## 2015-09-27 ENCOUNTER — Other Ambulatory Visit: Payer: Self-pay | Admitting: Pharmacist

## 2015-09-27 ENCOUNTER — Inpatient Hospital Stay (HOSPITAL_BASED_OUTPATIENT_CLINIC_OR_DEPARTMENT_OTHER)
Admission: EM | Admit: 2015-09-27 | Discharge: 2015-09-29 | Disposition: A | Discharge status: Home / Self Care | Payer: Medicare Other | Source: Home / Self Care | Attending: Infectious Disease | Admitting: Infectious Disease

## 2015-09-27 ENCOUNTER — Emergency Department (HOSPITAL_COMMUNITY): Payer: Medicare Other

## 2015-09-27 ENCOUNTER — Inpatient Hospital Stay (HOSPITAL_COMMUNITY): Payer: Medicare Other

## 2015-09-27 ENCOUNTER — Encounter (HOSPITAL_COMMUNITY): Payer: Self-pay | Admitting: Emergency Medicine

## 2015-09-27 DIAGNOSIS — J9602 Acute respiratory failure with hypercapnia: Secondary | ICD-10-CM | POA: Diagnosis present

## 2015-09-27 DIAGNOSIS — I509 Heart failure, unspecified: Secondary | ICD-10-CM | POA: Insufficient documentation

## 2015-09-27 DIAGNOSIS — D45 Polycythemia vera: Secondary | ICD-10-CM

## 2015-09-27 DIAGNOSIS — Z8249 Family history of ischemic heart disease and other diseases of the circulatory system: Secondary | ICD-10-CM

## 2015-09-27 DIAGNOSIS — I5022 Chronic systolic (congestive) heart failure: Secondary | ICD-10-CM | POA: Diagnosis present

## 2015-09-27 DIAGNOSIS — E872 Acidosis: Secondary | ICD-10-CM

## 2015-09-27 DIAGNOSIS — Z9114 Patient's other noncompliance with medication regimen: Secondary | ICD-10-CM

## 2015-09-27 DIAGNOSIS — I5033 Acute on chronic diastolic (congestive) heart failure: Secondary | ICD-10-CM

## 2015-09-27 DIAGNOSIS — J9601 Acute respiratory failure with hypoxia: Secondary | ICD-10-CM | POA: Diagnosis present

## 2015-09-27 DIAGNOSIS — I426 Alcoholic cardiomyopathy: Secondary | ICD-10-CM | POA: Diagnosis present

## 2015-09-27 DIAGNOSIS — I480 Paroxysmal atrial fibrillation: Secondary | ICD-10-CM

## 2015-09-27 DIAGNOSIS — J449 Chronic obstructive pulmonary disease, unspecified: Secondary | ICD-10-CM | POA: Diagnosis present

## 2015-09-27 DIAGNOSIS — D471 Chronic myeloproliferative disease: Secondary | ICD-10-CM | POA: Diagnosis present

## 2015-09-27 DIAGNOSIS — Z7901 Long term (current) use of anticoagulants: Secondary | ICD-10-CM

## 2015-09-27 DIAGNOSIS — J441 Chronic obstructive pulmonary disease with (acute) exacerbation: Secondary | ICD-10-CM | POA: Diagnosis present

## 2015-09-27 DIAGNOSIS — Z885 Allergy status to narcotic agent status: Secondary | ICD-10-CM

## 2015-09-27 DIAGNOSIS — I110 Hypertensive heart disease with heart failure: Secondary | ICD-10-CM | POA: Diagnosis present

## 2015-09-27 DIAGNOSIS — Z79899 Other long term (current) drug therapy: Secondary | ICD-10-CM

## 2015-09-27 DIAGNOSIS — I4891 Unspecified atrial fibrillation: Secondary | ICD-10-CM | POA: Diagnosis present

## 2015-09-27 DIAGNOSIS — R768 Other specified abnormal immunological findings in serum: Secondary | ICD-10-CM | POA: Insufficient documentation

## 2015-09-27 DIAGNOSIS — I10 Essential (primary) hypertension: Secondary | ICD-10-CM | POA: Diagnosis present

## 2015-09-27 DIAGNOSIS — F1110 Opioid abuse, uncomplicated: Secondary | ICD-10-CM

## 2015-09-27 DIAGNOSIS — F17210 Nicotine dependence, cigarettes, uncomplicated: Secondary | ICD-10-CM

## 2015-09-27 DIAGNOSIS — R7989 Other specified abnormal findings of blood chemistry: Secondary | ICD-10-CM | POA: Insufficient documentation

## 2015-09-27 LAB — PROTIME-INR
INR: 1.65 — ABNORMAL HIGH (ref 0.00–1.49)
Prothrombin Time: 19.5 seconds — ABNORMAL HIGH (ref 11.6–15.2)

## 2015-09-27 LAB — BASIC METABOLIC PANEL
ANION GAP: 13 (ref 5–15)
BUN: 27 mg/dL — AB (ref 6–20)
CHLORIDE: 101 mmol/L (ref 101–111)
CO2: 26 mmol/L (ref 22–32)
Calcium: 8.5 mg/dL — ABNORMAL LOW (ref 8.9–10.3)
Creatinine, Ser: 1.44 mg/dL — ABNORMAL HIGH (ref 0.61–1.24)
GFR calc Af Amer: 56 mL/min — ABNORMAL LOW (ref >60)
GFR, EST NON AFRICAN AMERICAN: 48 mL/min — AB (ref >60)
Glucose, Bld: 182 mg/dL — ABNORMAL HIGH (ref 65–99)
POTASSIUM: 4.5 mmol/L (ref 3.5–5.1)
SODIUM: 140 mmol/L (ref 135–145)

## 2015-09-27 LAB — CBC WITH DIFFERENTIAL/PLATELET
BASOS ABS: 0 10*3/uL (ref 0.0–0.1)
BASOS PCT: 0 %
BASOS PCT: 0 %
Basophils Absolute: 0 10*3/uL (ref 0.0–0.1)
EOS ABS: 0 10*3/uL (ref 0.0–0.7)
EOS PCT: 0 %
Eosinophils Absolute: 0 10*3/uL (ref 0.0–0.7)
Eosinophils Relative: 0 %
HCT: 40.1 % (ref 39.0–52.0)
HEMATOCRIT: 41.4 % (ref 39.0–52.0)
HEMOGLOBIN: 13.1 g/dL (ref 13.0–17.0)
HEMOGLOBIN: 13.4 g/dL (ref 13.0–17.0)
LYMPHS PCT: 5 %
LYMPHS PCT: 2 %
Lymphs Abs: 0.4 10*3/uL — ABNORMAL LOW (ref 0.7–4.0)
Lymphs Abs: 0.3 10*3/uL — ABNORMAL LOW (ref 0.7–4.0)
MCH: 38.5 pg — AB (ref 26.0–34.0)
MCH: 38.3 pg — ABNORMAL HIGH (ref 26.0–34.0)
MCHC: 32.7 g/dL (ref 30.0–36.0)
MCHC: 32.4 g/dL (ref 30.0–36.0)
MCV: 117.9 fL — AB (ref 78.0–100.0)
MCV: 118.3 fL — ABNORMAL HIGH (ref 78.0–100.0)
MONOS PCT: 4 %
Monocytes Absolute: 0.1 10*3/uL (ref 0.1–1.0)
Monocytes Absolute: 0.6 10*3/uL (ref 0.1–1.0)
Monocytes Relative: 1 %
NEUTROS ABS: 14.3 10*3/uL — AB (ref 1.7–7.7)
NEUTROS PCT: 94 %
NEUTROS PCT: 94 %
Neutro Abs: 6.6 10*3/uL (ref 1.7–7.7)
Platelets: 134 10*3/uL — ABNORMAL LOW (ref 150–400)
Platelets: 153 10*3/uL (ref 150–400)
RBC: 3.4 MIL/uL — ABNORMAL LOW (ref 4.22–5.81)
RBC: 3.5 MIL/uL — ABNORMAL LOW (ref 4.22–5.81)
RDW: 13.8 % (ref 11.5–15.5)
RDW: 14.1 % (ref 11.5–15.5)
WBC: 7.1 10*3/uL (ref 4.0–10.5)
WBC: 15.2 10*3/uL — ABNORMAL HIGH (ref 4.0–10.5)

## 2015-09-27 LAB — CBG MONITORING, ED: GLUCOSE-CAPILLARY: 163 mg/dL — AB (ref 65–99)

## 2015-09-27 LAB — I-STAT ARTERIAL BLOOD GAS, ED
ACID-BASE DEFICIT: 9 mmol/L — AB (ref 0.0–2.0)
BICARBONATE: 22.2 meq/L (ref 20.0–24.0)
Bicarbonate: 26.5 mEq/L — ABNORMAL HIGH (ref 20.0–24.0)
O2 SAT: 100 %
O2 SAT: 100 %
PCO2 ART: 50.5 mmHg — AB (ref 35.0–45.0)
PH ART: 7.108 — AB (ref 7.350–7.450)
TCO2: 24 mmol/L (ref 0–100)
TCO2: 28 mmol/L (ref 0–100)
pCO2 arterial: 70.1 mmHg (ref 35.0–45.0)
pH, Arterial: 7.328 — ABNORMAL LOW (ref 7.350–7.450)
pO2, Arterial: 347 mmHg — ABNORMAL HIGH (ref 80.0–100.0)
pO2, Arterial: 205 mmHg — ABNORMAL HIGH (ref 80.0–100.0)

## 2015-09-27 LAB — COMPREHENSIVE METABOLIC PANEL
ALBUMIN: 2.9 g/dL — AB (ref 3.5–5.0)
ALK PHOS: 87 U/L (ref 38–126)
ALT: 23 U/L (ref 17–63)
AST: 29 U/L (ref 15–41)
Anion gap: 11 (ref 5–15)
BILIRUBIN TOTAL: 0.6 mg/dL (ref 0.3–1.2)
BUN: 22 mg/dL — ABNORMAL HIGH (ref 6–20)
CALCIUM: 8.6 mg/dL — AB (ref 8.9–10.3)
CHLORIDE: 103 mmol/L (ref 101–111)
CO2: 25 mmol/L (ref 22–32)
CREATININE: 1.53 mg/dL — AB (ref 0.61–1.24)
GFR calc non Af Amer: 45 mL/min — ABNORMAL LOW (ref >60)
GFR, EST AFRICAN AMERICAN: 52 mL/min — AB (ref >60)
GLUCOSE: 213 mg/dL — AB (ref 65–99)
Potassium: 3.8 mmol/L (ref 3.5–5.1)
Sodium: 139 mmol/L (ref 135–145)
Total Protein: 6.6 g/dL (ref 6.5–8.1)

## 2015-09-27 LAB — I-STAT CG4 LACTIC ACID, ED: LACTIC ACID, VENOUS: 3.64 mmol/L — AB (ref 0.5–2.0)

## 2015-09-27 LAB — TROPONIN I: TROPONIN I: 0.09 ng/mL — AB (ref <0.031)

## 2015-09-27 LAB — BRAIN NATRIURETIC PEPTIDE: B NATRIURETIC PEPTIDE 5: 326.4 pg/mL — AB (ref 0.0–100.0)

## 2015-09-27 LAB — HIV ANTIBODY (ROUTINE TESTING W REFLEX): HIV SCREEN 4TH GENERATION: NONREACTIVE

## 2015-09-27 MED ORDER — TIOTROPIUM BROMIDE MONOHYDRATE 18 MCG IN CAPS: 18.0000 ug | ORAL_CAPSULE | Freq: Every day | RESPIRATORY_TRACT | Status: DC

## 2015-09-27 MED ORDER — DOXYCYCLINE HYCLATE 100 MG PO TABS: 100.0000 mg | ORAL_TABLET | Freq: Two times a day (BID) | ORAL | Status: AC

## 2015-09-27 MED ORDER — ALBUTEROL SULFATE HFA 108 (90 BASE) MCG/ACT IN AERS: 1.0000 | INHALATION_SPRAY | Freq: Four times a day (QID) | RESPIRATORY_TRACT | Status: DC | PRN

## 2015-09-27 MED ORDER — CETYLPYRIDINIUM CHLORIDE 0.05 % MT LIQD
7.0000 mL | Freq: Two times a day (BID) | OROMUCOSAL | Status: DC
  Administered 2015-09-27: 7 mL via OROMUCOSAL

## 2015-09-27 MED ORDER — BECLOMETHASONE DIPROPIONATE 80 MCG/ACT IN AERS: 1.0000 | INHALATION_SPRAY | Freq: Two times a day (BID) | RESPIRATORY_TRACT | Status: DC

## 2015-09-27 MED ORDER — DEXAMETHASONE SODIUM PHOSPHATE 10 MG/ML IJ SOLN
10.0000 mg | Freq: Once | INTRAMUSCULAR | Status: AC
  Administered 2015-09-27: 10 mg via INTRAVENOUS
  Filled 2015-09-27: qty 1

## 2015-09-27 MED ORDER — IPRATROPIUM-ALBUTEROL 0.5-2.5 (3) MG/3ML IN SOLN: 3.0000 mL | RESPIRATORY_TRACT | Status: DC

## 2015-09-27 MED ORDER — FUROSEMIDE 40 MG PO TABS: 40.0000 mg | ORAL_TABLET | Freq: Every day | ORAL | Status: DC

## 2015-09-27 MED ORDER — IPRATROPIUM BROMIDE 0.02 % IN SOLN
0.5000 mg | Freq: Once | RESPIRATORY_TRACT | Status: AC
Start: 2015-09-27 — End: 2015-09-27
  Administered 2015-09-27: 0.5 mg via RESPIRATORY_TRACT
  Filled 2015-09-27: qty 2.5

## 2015-09-27 MED ORDER — PREDNISONE 50 MG PO TABS: ORAL_TABLET | ORAL | Status: DC

## 2015-09-27 MED ORDER — IPRATROPIUM BROMIDE 0.02 % IN SOLN: 0.5000 mg | Freq: Once | RESPIRATORY_TRACT | Status: DC

## 2015-09-27 MED ORDER — ALBUTEROL (5 MG/ML) CONTINUOUS INHALATION SOLN
10.0000 mg/h | INHALATION_SOLUTION | Freq: Once | RESPIRATORY_TRACT | Status: AC
  Administered 2015-09-27: 10 mg/h via RESPIRATORY_TRACT
  Filled 2015-09-27: qty 20

## 2015-09-27 MED ORDER — WARFARIN SODIUM 5 MG PO TABS
7.5000 mg | ORAL_TABLET | Freq: Once | ORAL | Status: AC
  Administered 2015-09-27: 7.5 mg via ORAL
  Filled 2015-09-27: qty 1.5

## 2015-09-27 MED ORDER — FUROSEMIDE 40 MG PO TABS
40.0000 mg | ORAL_TABLET | Freq: Every day | ORAL | Status: DC
  Administered 2015-09-27: 40 mg via ORAL
  Filled 2015-09-27: qty 1

## 2015-09-27 NOTE — Progress Notes (Signed)
  Subjective: [name] had no new complaints overnight. Today he is feeling better and breathing well on room air. He reports some minor wheezing this morning but no shortness of breath or chest pain.  Objective: Vital signs in last 24 hours: Filed Vitals:   09/27/15 0836 09/27/15 1033 09/27/15 1200 09/27/15 1224  BP: 119/53  111/60 111/60  Pulse: 57 65 70 73  Temp:    97.7 F (36.5 C)  TempSrc:    Oral  Resp: 17   17  Height:      Weight:      SpO2: 97%  99% 95%   Weight change:   Intake/Output Summary (Last 24 hours) at 09/27/15 1404 Last data filed at 09/27/15 0945  Gross per 24 hour  Intake    420 ml  Output    700 ml  Net   -280 ml   Physical Exam: General appearance: alert, cooperative, and no distress Lungs: no increased work of breathing, slight expiratory wheezes bilaterally Heart: regular rate and rhythm Extremities: no swelling or edema  Lab Results: Basic Metabolic Panel:  Recent Labs Lab 09/26/15 0923 09/27/15 0320  NA 143 139  K 3.7 3.8  CL 107 103  CO2 24 25  GLUCOSE 216* 213*  BUN 15 22*  CREATININE 1.46* 1.53*  CALCIUM 8.6* 8.6*   BNP:    Component Value Date/Time   BNP 441.4* 09/26/2015 0823    CBC:  Recent Labs Lab 09/26/15 0823 09/27/15 0320  WBC 11.6* 7.1  NEUTROABS 5.5 6.6  HGB 15.6 13.1  HCT 47.1 40.1  MCV 121.4* 117.9*  PLT 188 134*   Studies/Results: Dg Chest 2 View  09/27/2015  CLINICAL DATA:  Patient with shortness breath and cough. EXAM: CHEST  2 VIEW COMPARISON:  Chest radiograph 09/26/2015. FINDINGS: Monitoring leads overlie the patient. Stable enlarged cardiac and mediastinal contours. No consolidative pulmonary opacities. No pleural effusion or pneumothorax. Thoracic spine degenerative changes. IMPRESSION: No acute cardiopulmonary process. Electronically Signed   By: Lovey Newcomer M.D.   On: 09/27/2015 09:41   Assessment/Plan: COPD with acute exacerbation ([place]) Exacerbation most likely due to viral URI  - flu is negative. CXR is now clear and pt is improving. O2 sats okay while ambulating without oxygen.  -Continue doxycycline 100 mg BID and prednisone 50 mg daily for 5 days -Duoneb Q4 hour -Albuterol nebulizer Q2 hour PRN -Pulmicort nebulizer 0.25 mg BID   This is a Careers information officer Note.  The care of the patient was discussed with Dr. Merrilyn Puma and the assessment and plan formulated with their assistance.  Please see their attached note for official documentation of the daily encounter.   LOS: 1 day   Marla Roe, Med Student 09/27/2015, 2:04 PM

## 2015-09-27 NOTE — Progress Notes (Signed)
Discharge instructions read and given to patient.  Pt verbalize understanding. Telemetry and IV removed. Pt waiting on family to come with clothing.

## 2015-09-27 NOTE — ED Notes (Signed)
Pt aware of need for urine specimen. 

## 2015-09-27 NOTE — ED Notes (Signed)
Pt's friend, ARNOLD, at bedside and states it looks like pt has doubled his weight in the last 3 weeks since he saw him last.  States he is going home and that pt's wife is unable to drive to come to hospital.  States he was here to pick pt up and had went to get the car when pt started to have problems breathing.  Call him with any updates [phone].

## 2015-09-27 NOTE — Discharge Instructions (Signed)
[name],  It was a pleasure taking care of you. Please take the prednisone and doxycycline as instructed for the next 4 days until you run out. I have also refilled your furosemide prescription and sent it to your pharmacy, so continue taking that as you have been at home. Make sure to let us know ahead of time if you're running out.  Please follow up in our clinic next Thursday, March 16th at 1:15pm for a hospital follow-up.  Have a great weekend, Blane Ohara

## 2015-09-27 NOTE — Progress Notes (Signed)
   Subjective: Mr. Bonham Massaro had no acute events overnight. Today, he feels well and back on room air. He denies shortness of breath, chest pain, fevers, cough, or other symptoms at this time.  Objective: Vital signs in last 24 hours: Filed Vitals:   09/27/15 0000 09/27/15 0400 09/27/15 0500 09/27/15 0600  BP: 109/42 106/50  106/45  Pulse: 57 64  59  Temp: 98.2 F (36.8 C) 98.2 F (36.8 C)    TempSrc: Oral Oral    Resp: 11 14    Height:      Weight:   216 lb 0.8 oz (98 kg)   SpO2: 94% 95%  94%   Gen: Well-appearing, alert and oriented to person, place, and time Neck: No cervical LAD, no thyromegaly or nodules, no JVD noted. CV: Normal rate, regular rhythm, no murmurs, rubs, or gallops Pulmonary:  Normal effort, faint expiratory wheezes bilaterally. No crackles. Abdominal: Soft, non-tender, non-distended, without rebound, guarding, or masses Extremities: Distal pulses 2+ in upper and lower extremities bilaterally, no tenderness, erythema or edema Skin: No atypical appearing moles. No rashes  Lab Results: Basic Metabolic Panel:  Recent Labs Lab 09/26/15 0923 09/27/15 0320  NA 143 139  K 3.7 3.8  CL 107 103  CO2 24 25  GLUCOSE 216* 213*  BUN 15 22*  CREATININE 1.46* 1.53*  CALCIUM 8.6* 8.6*   Liver Function Tests:  Recent Labs Lab 09/26/15 0923 09/27/15 0320  AST 50* 29  ALT 27 23  ALKPHOS 103 87  BILITOT 0.7 0.6  PROT 6.6 6.6  ALBUMIN 3.0* 2.9*   CBC:  Recent Labs Lab 09/26/15 0823 09/27/15 0320  WBC 11.6* 7.1  NEUTROABS 5.5 6.6  HGB 15.6 13.1  HCT 47.1 40.1  MCV 121.4* 117.9*  PLT 188 134*   Assessment/Plan: Acute hypoxic/hypercapnic respiratory failure in setting of GOLD Stage 3 COPD exacerbation - CXR with mild interstitial edema with no pneumonia. Trigger of COPD exacerbation most likely viral URI in setting of recent sick contacts. Pt reports compliance with maintenance inhaler therapy. Influenza negative here. Improving.  -Ambulate and  check O2 sats -Oxygen therapy to keep SpO2 88-90% -Solumedrol 60 mg Q 6 hr (received 125 mg in ED), transition to oral prednisone -Duoneb Q 4 hr while awake and albuterol nebulizer 5 mg Q 2 hr PRN -Pulmicort nebulizer 0.25 mg BID, hold home spiriva with duoneb use -S/p ceftriaxone and azithromycin x1, now on doxycyline 100 mg BID (less coumadin interaction) in setting of sputum production -Follow-up blood cultures and obtain sputum culture and gram stain  -Consider pulmonary rehab on discharge and repeating PFT's (has been >1 yr)   Acute on chronic diastolic CHF - Pt with elevated BNP 441 with mild interstitial edema and noncompliant with lasix 40 mg daily for past week. Last echo on 06/07/14 with grade 1 diastolic dysfunction.  -IV lasix 40 mg x 1, resume home lasix -Repeat CXR in AM -Monitor daily weights and strict I & O's  -Monitor BMP -Continue home carvedilol 12.5 mg BID  Dispo: Disposition is deferred at this time, awaiting improvement of current medical problems.  Anticipated discharge in approximately 0-1 day(s).   The patient does have a current PCP Jule Ser, DO) and does need an [facility] hospital follow-up appointment after discharge.  The patient does not have transportation limitations that hinder transportation to clinic appointments.   LOS: 1 day   Norval Gable, MD 09/27/2015, 7:27 AM

## 2015-09-27 NOTE — ED Provider Notes (Signed)
CSN: [id number]     Arrival date & time 09/27/15  1932 History   First MD Initiated Contact with Patient 09/27/15 1934     Chief Complaint  Patient presents with  . Respiratory Distress     (Consider location/radiation/quality/duration/timing/severity/associated sxs/prior Treatment) HPI Comments: LEVEL 5 CAVEAT FOR RESPIRATORY DISTRESS. Pt with hx of COPD and CHF comes in with cc of dib. Pt was just discharged from the hospital after 5:30. He reports to the triage that he never made it to his car/home - and turned around to come to the ER for DIB. Pt arrives with resp distress, diophoresis, unable to speak more than 1-2 words w/o having to stop. He denies chest pain.    The history is provided by medical records. The history is limited by the condition of the patient.    Past Medical History  Diagnosis Date  . Cardiomyopathy     EF55% 11/14<<35%   . Substance abuse     alcohol  . Hypertension   . Atrial fibrillation /flutter     hx of flutter ablation 2/12  . Shortness of breath     "all the time lately" (12/14/2012)  . GERD (gastroesophageal reflux disease)   . Hepatitis     "think I had that once; a long time ago; from dirty needles I think" (12/14/2012)  . Primary polycythemia ([place]) 06/12/2013  . Leukocytosis, unspecified 06/12/2013  . 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 Obesity   . Syncope   . Cancer ([place])   . CHF (congestive heart failure) ([place])   . Renal insufficiency   . COPD (chronic obstructive pulmonary disease) [facility])    Past Surgical History  Procedure Laterality Date  . Hemorrhoid surgery  1970's  . Cardiac electrophysiology mapping and ablation  08/2010    [name] 09/07/2010 (12/14/2012)  . Loop recorder implant  08-22-2013    MDT LinQ implanted by Dr Caryl Comes for syncope  . Loop recorder implant N/A 08/23/2013    Procedure: LOOP RECORDER IMPLANT;  Surgeon: Deboraha Sprang, MD;  Location: [facility] CATH LAB;  Service: Cardiovascular;  Laterality: N/A;  .  Colonoscopy      15-20 years ago had colon in [place]   Family History  Problem Relation Age of Onset  . Diabetes Mother   . Hypertension Mother   . Cirrhosis Father   . Alcohol abuse Father   . Colon cancer Neg Hx   . Rectal cancer Neg Hx   . Stomach cancer Neg Hx    Social History  Substance Use Topics  . Smoking status: Current Every Day Smoker -- 0.20 packs/day for 40 years    Types: Cigarettes  . Smokeless tobacco: Never Used     Comment: patient smoked 1-2 cigs x 50 years, now smoking approx 4-5 cigs per day x 1 year  . Alcohol Use: 0.0 oz/week    0 Standard drinks or equivalent per week    Review of Systems  Unable to perform ROS: Severe respiratory distress      Allergies  Codeine  Home Medications   Prior to Admission medications   Medication Sig Start Date End Date Taking? Authorizing Provider  albuterol (PROAIR HFA) 108 (90 Base) MCG/ACT inhaler Inhale 1-2 puffs into the lungs every 6 (six) hours as needed for wheezing or shortness of breath. PLACE ON HOLD UNTIL PATIENT REQUESTS TO FILL 09/27/15   Juluis Mire, MD  amiodarone (PACERONE) 200 MG tablet Take 1 tablet (200 mg total) by mouth daily. 12/10/14  Norman Herrlich, MD  beclomethasone (QVAR) 80 MCG/ACT inhaler Inhale 1 puff into the lungs 2 (two) times daily. PLACE ON HOLD UNTIL PATIENT REQUESTS TO FILL 09/27/15   Juluis Mire, MD  carvedilol (COREG) 12.5 MG tablet Take 12.5 mg by mouth daily.     Historical Provider, MD  doxycycline (VIBRA-TABS) 100 MG tablet Take 1 tablet (100 mg total) by mouth every 12 (twelve) hours. 09/27/15 09/30/15  Norval Gable, MD  folic acid (FOLVITE) 1 MG tablet Take 1 tablet (1 mg total) by mouth daily. 07/29/15   Jule Ser, DO  furosemide (LASIX) 40 MG tablet Take 1 tablet (40 mg total) by mouth daily. 09/27/15   Norval Gable, MD  hydroxyurea (HYDREA) 500 MG capsule Take 2 capsules (1,000 mg total) by mouth daily. May take with food to minimize GI side effects. Patient  taking differently: Take 500-1,000 mg by mouth daily. Take 1 capsule on M-W-F, and 2 capsule all other days 08/24/14   Heath Lark, MD  hydroxyurea (HYDREA) 500 MG capsule take 3 capsules by mouth once daily with food TO MINIMIZE GI SIDE EFFECTS Patient not taking: Reported on 09/26/2015 03/11/15   Heath Lark, MD  Multiple Vitamin (MULTIVITAMIN WITH MINERALS) TABS tablet Take 1 tablet by mouth daily. Patient not taking: Reported on 09/26/2015 02/26/15   Burgess Estelle, MD  predniSONE (DELTASONE) 50 MG tablet Take one tablet by mouth once per day for 4 days total 09/28/15   Norval Gable, MD  SUBOXONE 8-2 MG FILM Take 1 Film by mouth daily.  08/21/15   Historical Provider, MD  tiotropium (SPIRIVA HANDIHALER) 18 MCG inhalation capsule Place 1 capsule (18 mcg total) into inhaler and inhale daily. PLACE ON HOLD UNTIL PATIENT REQUESTS TO FILL 09/27/15   Juluis Mire, MD  warfarin (COUMADIN) 2.5 MG tablet Take 1 tablet (2.5 mg total) by mouth daily. Except skip dose on Tuesdays. PHARMACIST NOTE: DOSE AND TABLET STRENGTH CHANGE. PLEASE VOID PREVIOUS RX ON FILE FOR WARFARIN. Patient taking differently: Take 1.25-2.5 mg by mouth daily. Take 1 tablet all days except on Tues and Friday take 1/2 tablet 05/30/15   Axel Filler, MD   BP 123/75 mmHg  Pulse 69  Resp 12  SpO2 100% Physical Exam  Constitutional: He is oriented to person, place, and time. He appears distressed.  HENT:  Head: Atraumatic.  Eyes: Conjunctivae and EOM are normal.  Neck: Neck supple. No JVD present.  Cardiovascular: Regular rhythm.   Pulmonary/Chest: He is in respiratory distress. He has wheezes.  Poor aeration of the lungs and exp wheezing  Abdominal: Soft. Bowel sounds are normal. He exhibits no distension. There is no tenderness. There is no rebound and no guarding.  Musculoskeletal: He exhibits edema.  1+ pitting edema bilaterally  Neurological: He is alert and oriented to person, place, and time.  Skin: Skin is warm. He is  diaphoretic.  Vitals reviewed.   ED Course  Procedures (including critical care time) Labs Review Labs Reviewed  CBC WITH DIFFERENTIAL/PLATELET - Abnormal; Notable for the following:    WBC 15.2 (*)    RBC 3.50 (*)    MCV 118.3 (*)    MCH 38.3 (*)    Neutro Abs 14.3 (*)    Lymphs Abs 0.3 (*)    All other components within normal limits  BASIC METABOLIC PANEL - Abnormal; Notable for the following:    Glucose, Bld 182 (*)    BUN 27 (*)    Creatinine, Ser 1.44 (*)  Calcium 8.5 (*)    GFR calc non Af Amer 48 (*)    GFR calc Af Amer 56 (*)    All other components within normal limits  TROPONIN I - Abnormal; Notable for the following:    Troponin I 0.09 (*)    All other components within normal limits  I-STAT CG4 LACTIC ACID, ED - Abnormal; Notable for the following:    Lactic Acid, Venous 3.64 (*)    All other components within normal limits  CBG MONITORING, ED - Abnormal; Notable for the following:    Glucose-Capillary 163 (*)    All other components within normal limits  I-STAT ARTERIAL BLOOD GAS, ED - Abnormal; Notable for the following:    pH, Arterial 7.108 (*)    pCO2 arterial 70.1 (*)    pO2, Arterial 347.0 (*)    Acid-base deficit 9.0 (*)    All other components within normal limits  I-STAT ARTERIAL BLOOD GAS, ED - Abnormal; Notable for the following:    pH, Arterial 7.328 (*)    pCO2 arterial 50.5 (*)    pO2, Arterial 205.0 (*)    Bicarbonate 26.5 (*)    All other components within normal limits  BRAIN NATRIURETIC PEPTIDE  URINALYSIS, ROUTINE W REFLEX MICROSCOPIC (NOT AT [facility])  URINE RAPID DRUG SCREEN, HOSP PERFORMED  BLOOD GAS, ARTERIAL  BLOOD GAS, ARTERIAL    Imaging Review Dg Chest 2 View  09/27/2015  CLINICAL DATA:  Patient with shortness breath and cough. EXAM: CHEST  2 VIEW COMPARISON:  Chest radiograph 09/26/2015. FINDINGS: Monitoring leads overlie the patient. Stable enlarged cardiac and mediastinal contours. No consolidative pulmonary opacities. No  pleural effusion or pneumothorax. Thoracic spine degenerative changes. IMPRESSION: No acute cardiopulmonary process. Electronically Signed   By: Lovey Newcomer M.D.   On: 09/27/2015 09:41   Dg Chest Port 1 View  09/27/2015  CLINICAL DATA:  Dyspnea EXAM: PORTABLE CHEST 1 VIEW COMPARISON:  09/27/2015 FINDINGS: Cardiac enlargement.  Cardiac loop recorder unchanged. Increase and vascular congestion since the prior study suggesting fluid overload with mild interstitial edema. Small right pleural effusion. IMPRESSION: Interval development of vascular congestion and mild edema since earlier today. Electronically Signed   By: Franchot Gallo M.D.   On: 09/27/2015 19:52   Dg Chest Portable 1 View  09/26/2015  CLINICAL DATA:  Shortness of Breath EXAM: PORTABLE CHEST 1 VIEW COMPARISON:  12/04/2014 FINDINGS: Cardiomediastinal silhouette is stable. Mild perihilar bilateral interstitial prominence left greater than right. Mild basilar atelectasis. Findings suspicious for mild interstitial edema. Interstitial pneumonitis cannot be excluded. IMPRESSION: Mild perihilar bilateral interstitial prominence left greater than right. Findings suspicious for interstitial edema or bilateral pneumonitis. Mild basilar atelectasis. Electronically Signed   By: Lahoma Crocker M.D.   On: 09/26/2015 08:46   I have personally reviewed and evaluated these images and lab results as part of my medical decision-making.   EKG Interpretation   Date/Time:  Friday September 27 2015 19:37:17 EST Ventricular Rate:  114 PR Interval:  148 QRS Duration: 110 QT Interval:  319 QTC Calculation: 439 R Axis:   39 Text Interpretation:  Sinus tachycardia Right atrial enlargement  Borderline ST depression, lateral leads Nonspecific ST and T wave  abnormality No significant change since last tracing Confirmed by  Anntoinette Haefele, MD, Thelma Comp [phone]) on 09/27/2015 7:49:29 PM      MDM   Final diagnoses:  Acute respiratory failure with hypoxia ([place])  COPD with  exacerbation ([place])    Pt comes in with acute resp failure.  Hx of COPD, CHF - just discharge few minutes ago. Admitted for COPD exacerbation, requiring bipap.  Ddx: COPD exacerbation, flash pulm edema.  Will start bipap. ABG ordered to help differentiate and CXR ordered.   9:32 PM Pt reassessed and he is breathing a lot more comfrotably. His BP has normalized on it's own. Repeat ABG ordered and it has drastically improved. Will admit - likely best to admit stepdown.  CRITICAL CARE Performed by: Varney Biles   Total critical care time: 48 minutes  Critical care time was exclusive of separately billable procedures and treating other patients.  Critical care was necessary to treat or prevent imminent or life-threatening deterioration.  Critical care was time spent personally by me on the following activities: development of treatment plan with patient and/or surrogate as well as nursing, discussions with consultants, evaluation of patient's response to treatment, examination of patient, obtaining history from patient or surrogate, ordering and performing treatments and interventions, ordering and review of laboratory studies, ordering and review of radiographic studies, pulse oximetry and re-evaluation of patient's condition.    Varney Biles, MD 09/27/15 2133

## 2015-09-27 NOTE — Progress Notes (Signed)
Collaboration with team on transitions of care per Dr. Naaman Plummer. Meds-to-bed  Medication Samples have been provided to the patient. Pulmicort was interchanged to Asmanex based on samples available.   Drug name: Asmanex (mometasone)       Strength: 100 mcg        Qty: 1  [id number]  Exp.Date: 06/16/16  Drug name: Ventolin (albuterol)       Strength:  90 mcg/puff       Qty: 1  LOT:  [id number] Exp.Date: January 2018  Patient agreed to enrollment in [place].  The patient has been instructed along with Felicity Pellegrini, PharmD Candidate regarding the correct time, dose, and frequency of taking this medication, including desired effects and most common side effects.   Will send Rx to [facility] for QVAR per insurance formulary coverage along with renewal of albuterol and Spiriva inhalers (pharmacy to place Rx's on hold until patient requests refill)  Alcee Sipos J 10:36 AM 09/27/2015

## 2015-09-27 NOTE — H&P (Signed)
Date: 09/28/2015               Patient Name:  [name] MRN: [id number]  DOB: 05-02-1946 Age / Sex: 70 y.o., male   PCP: Jule Ser, DO         Medical Service: Internal Medicine Teaching Service         Attending Physician: Dr. Truman Hayward, MD    First Contact: Dr. Blane Ohara Pager: [phone]  Second Contact: Dr. Julious Oka Pager: [phone]       After Hours (After 5p/  First Contact Pager: [phone]  weekends / holidays): Second Contact Pager: [phone]   Chief Complaint: Shortness of breath  History of Present Illness: [name] is a 70 year old male with a past medical history of COPD Gold Stage 3, polycythemia vera HTN, PAF on warfarin, diastolic CHF, history of heroin abuse and tobacco abuse who presents today with shortness of breath. He was admitted on 3/9 with a one week history of shortness of breath, wheezing and productive cough. On arrival at that time he was hypoxic requiring BiPAP with hypercapnia and respiratory acidosis. He responded well to BiPAP and was doing well on RA this morning and afternoon prior to discharge with no shortness of breath.   After discharge this afternoon, he was in the car diving home and became acutely short of breath. He reports trying both the albuterol and asmanex inhalers provided for him at discharge with no improvement in symptoms. He promptly came back to the ED for evaluation. In the ED, he was found to be in acute respiratory distress only able to speak 1-2 words at a time. ABG revealed a gas of 01/17/69/347/22. He was placed on BiPAP with good response. Repeat ABG was improved at 7.32/50.5/205/26.5. He was able to come off BiPAP and breathing comfortably on 2L nasal [place]. Able to carry on a normal conversation. Denies any further shortness of breath. Denies any chest pain, abdominal pain, nausea/vomiting, fever, chills, headache.    Meds: Current Facility-Administered Medications  Medication Dose  Route Frequency Provider Last Rate Last Dose  . acetaminophen (TYLENOL) tablet 650 mg  650 mg Oral Q6H PRN Francesca Oman, DO       Or  . acetaminophen (TYLENOL) suppository 650 mg  650 mg Rectal Q6H PRN Francesca Oman, DO      . amiodarone (PACERONE) tablet 200 mg  200 mg Oral Daily Francesca Oman, DO      . carvedilol (COREG) tablet 12.5 mg  12.5 mg Oral Q breakfast Francesca Oman, DO      . doxycycline (VIBRA-TABS) tablet 100 mg  100 mg Oral Q12H Francesca Oman, DO      . folic acid (FOLVITE) tablet 1 mg  1 mg Oral Daily Francesca Oman, DO      . furosemide (LASIX) tablet 40 mg  40 mg Oral Daily Francesca Oman, DO      . hydroxyurea (HYDREA) capsule 1,000 mg  1,000 mg Oral Once per day on Sun Tue Thu Sat Francesca Oman, DO      . Derrill Memo ON 09/30/2015] hydroxyurea (HYDREA) capsule 500 mg  500 mg Oral Once per day on Mon Wed Fri Francesca Oman, DO      . ipratropium-albuterol (DUONEB) 0.5-2.5 (3) MG/3ML nebulizer solution 3 mL  3 mL Nebulization Q6H Francesca Oman, DO   3 mL at 09/28/15 0108  . predniSONE (DELTASONE) tablet 50 mg  50 mg Oral Q breakfast Francesca Oman, DO      . sodium chloride flush (NS) 0.9 % injection 3 mL  3 mL Intravenous Q12H Francesca Oman, DO   3 mL at 09/28/15 0328  . sodium chloride flush (NS) 0.9 % injection 3 mL  3 mL Intravenous PRN Francesca Oman, DO      . warfarin (COUMADIN) tablet 5 mg  5 mg Oral ONCE-1800 Veronda P Bryk, RPH      . Warfarin - Pharmacist Dosing Inpatient   Does not apply q1800 Laren Everts, RPH        Allergies: Allergies as of 09/27/2015 - Review Complete 09/27/2015  Allergen Reaction Noted  . Codeine Rash 09/30/2010   Past Medical History  Diagnosis Date  . Cardiomyopathy     EF55% 11/14<<35%   . Substance abuse     alcohol  . Hypertension   . Atrial fibrillation /flutter     hx of flutter ablation 2/12  . Shortness of breath     "all the time lately" (12/14/2012)  . GERD (gastroesophageal reflux disease)   . Hepatitis     "think I had  that once; a long time ago; from dirty needles I think" (12/14/2012)  . Primary polycythemia ([place]) 06/12/2013  . Leukocytosis, unspecified 06/12/2013  . 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 Obesity   . Syncope   . Cancer ([place])   . CHF (congestive heart failure) ([place])   . Renal insufficiency   . COPD (chronic obstructive pulmonary disease) [facility])    Past Surgical History  Procedure Laterality Date  . Hemorrhoid surgery  1970's  . Cardiac electrophysiology mapping and ablation  08/2010    [name] 09/07/2010 (12/14/2012)  . Loop recorder implant  08-22-2013    MDT LinQ implanted by Dr Caryl Comes for syncope  . Loop recorder implant N/A 08/23/2013    Procedure: LOOP RECORDER IMPLANT;  Surgeon: Deboraha Sprang, MD;  Location: [facility] CATH LAB;  Service: Cardiovascular;  Laterality: N/A;  . Colonoscopy      15-20 years ago had colon in [place]   Family History  Problem Relation Age of Onset  . Diabetes Mother   . Hypertension Mother   . Cirrhosis Father   . Alcohol abuse Father   . Colon cancer Neg Hx   . Rectal cancer Neg Hx   . Stomach cancer Neg Hx    Social History   Social History  . Marital Status: Single    Spouse Name: N/A  . Number of Children: N/A  . Years of Education: N/A   Occupational History  . Retired    Social History Main Topics  . Smoking status: Current Every Day Smoker -- 0.20 packs/day for 40 years    Types: Cigarettes  . Smokeless tobacco: Never Used     Comment: patient smoked 1-2 cigs x 50 years, now smoking approx 4-5 cigs per day x 1 year  . Alcohol Use: 0.0 oz/week    0 Standard drinks or equivalent per week  . Drug Use: Yes    Special: "Crack" cocaine, Cocaine, Heroin     Comment: 03/20/2013 "been clean for awhile"; endorses using heroin "all my life" up until about a year ago-- currently denies any drug use-- on suboxone.   . Sexual Activity: Not Currently   Other Topics Concern  . Not on file   Social History Narrative   Moved here from [place].  Lives with common law wife and grandchildren. He  is retired from "general labor."    Review of Systems: Pertinent items noted in HPI and remainder of comprehensive ROS otherwise negative.  Physical Exam: Blood pressure 136/79, pulse 65, resp. rate 11, height 5\' 6"  (1.676 m), weight 216 lb 0.8 oz (98 kg), SpO2 95 %. General: alert, well-developed, and cooperative to examination.  Head: normocephalic and atraumatic.  Eyes: vision grossly intact, pupils equal, pupils round, pupils reactive to light, no injection and anicteric.  Mouth: pharynx pink and moist, no erythema, and no exudates.  Neck: supple, full ROM, no thyromegaly, no JVD, and no carotid bruits.  Lungs: normal respiratory effort, no accessory muscle use, coarse breath sounds with rhonchi and expiratory wheezing, no crackles, good air movement Heart: normal rate, regular rhythm, no murmur, no gallop, and no rub.  Abdomen: soft, non-tender, normal bowel sounds, distended, no guarding, no rebound tenderness Msk: no joint swelling, no joint warmth, and no redness over joints.  Pulses: 2+ DP/PT pulses bilaterally Extremities:  1+ pitting edema to knees bilataerally Neurologic: alert & oriented X3, cranial nerves II-XII intact, no focal deficits Skin: turgor normal and no rashes.  Psych: normal mood and affect  Lab results: Basic Metabolic Panel:  Recent Labs  09/27/15 0320 09/27/15 2020  NA 139 140  K 3.8 4.5  CL 103 101  CO2 25 26  GLUCOSE 213* 182*  BUN 22* 27*  CREATININE 1.53* 1.44*  CALCIUM 8.6* 8.5*   Liver Function Tests:  Recent Labs  09/26/15 0923 09/27/15 0320  AST 50* 29  ALT 27 23  ALKPHOS 103 87  BILITOT 0.7 0.6  PROT 6.6 6.6  ALBUMIN 3.0* 2.9*   CBC:  Recent Labs  09/27/15 0320 09/27/15 2020  WBC 7.1 15.2*  NEUTROABS 6.6 14.3*  HGB 13.1 13.4  HCT 40.1 41.4  MCV 117.9* 118.3*  PLT 134* 153   Cardiac Enzymes:  Recent Labs  09/27/15 2020  TROPONINI 0.09*   CBG:  Recent Labs   09/27/15 1939  GLUCAP 163*   Coagulation:  Recent Labs  09/26/15 0923 09/27/15 0320  LABPROT 21.1* 19.5*  INR 1.83* 1.65*   Urine Drug Screen: Drugs of Abuse     Component Value Date/Time   LABOPIA POSITIVE* 09/26/2015 1155   LABOPIA NEGATIVE 06/05/2014 0101   COCAINSCRNUR NONE DETECTED 09/26/2015 1155   COCAINSCRNUR NEGATIVE 06/05/2014 0101   LABBENZ NONE DETECTED 09/26/2015 1155   LABBENZ NEGATIVE 06/05/2014 0101   AMPHETMU NONE DETECTED 09/26/2015 1155   AMPHETMU NEGATIVE 06/05/2014 0101   THCU NONE DETECTED 09/26/2015 1155   LABBARB NONE DETECTED 09/26/2015 1155    Alcohol Level: No results for input(s): ETH in the last 72 hours. Urinalysis:  Recent Labs  09/26/15 1155  COLORURINE YELLOW  LABSPEC 1.011  PHURINE 5.0  GLUCOSEU NEGATIVE  HGBUR SMALL*  BILIRUBINUR NEGATIVE  KETONESUR NEGATIVE  PROTEINUR 30*  NITRITE NEGATIVE  LEUKOCYTESUR NEGATIVE   Imaging results:  Dg Chest 2 View  09/27/2015  CLINICAL DATA:  Patient with shortness breath and cough. EXAM: CHEST  2 VIEW COMPARISON:  Chest radiograph 09/26/2015. FINDINGS: Monitoring leads overlie the patient. Stable enlarged cardiac and mediastinal contours. No consolidative pulmonary opacities. No pleural effusion or pneumothorax. Thoracic spine degenerative changes. IMPRESSION: No acute cardiopulmonary process. Electronically Signed   By: Lovey Newcomer M.D.   On: 09/27/2015 09:41   Dg Chest Port 1 View  09/27/2015  CLINICAL DATA:  Dyspnea EXAM: PORTABLE CHEST 1 VIEW COMPARISON:  09/27/2015 FINDINGS: Cardiac enlargement.  Cardiac loop recorder unchanged. Increase  and vascular congestion since the prior study suggesting fluid overload with mild interstitial edema. Small right pleural effusion. IMPRESSION: Interval development of vascular congestion and mild edema since earlier today. Electronically Signed   By: Franchot Gallo M.D.   On: 09/27/2015 19:52   Dg Chest Portable 1 View  09/26/2015  CLINICAL DATA:   Shortness of Breath EXAM: PORTABLE CHEST 1 VIEW COMPARISON:  12/04/2014 FINDINGS: Cardiomediastinal silhouette is stable. Mild perihilar bilateral interstitial prominence left greater than right. Mild basilar atelectasis. Findings suspicious for mild interstitial edema. Interstitial pneumonitis cannot be excluded. IMPRESSION: Mild perihilar bilateral interstitial prominence left greater than right. Findings suspicious for interstitial edema or bilateral pneumonitis. Mild basilar atelectasis. Electronically Signed   By: Lahoma Crocker M.D.   On: 09/26/2015 08:46   Other results: EKG: sinus tachycardia, right atrial enlargement, borderline ST depression, lateral leads nonspecific ST and T wave abnormality.   Assessment & Plan by Problem:  Acute hypoxic/hypercapnic respiratory failure in setting of GOLD Stage 3 COPD exacerbation: Patient discharged this afternoon and returned with acute hypoxic/hypercapneic respiratory failure. He required BiPAP in the ED with quick resolution of his symptoms. Initial ABG 01/17/69/347/22 with improvement to 7.32/50.5/205/26.5 after 30 minutes of BiPAP. He was asymptotic by the time we examined him the ED. He was able to tolerate coming of the BiPAP and doing well on 2L [place]. Did have diffuse rhonchi and wheezes on exam. Unclear trigger for his respiratory failure after leaving the hospital. He did have a 1-week history of URI symptom and found to be hypoxic on 3/9 admission. Lactic acid elevated at 3.6 on arrival and trop mildly elevated at 0.09. Likely due to hypoxia. Flu negative last admission. -Admit to SDU -Bipap as needed -Oxygen therapy to keep SpO2 88-90% -Prednisone 50 mg daily (received dexamethasone 10 mg IV in the ED) -Duoneb q6hr -Pulmicort nebulizer 0.25 mg BID, hold home spiriva with duoneb use -Continue doxycyline 100 mg BID (less coumadin interaction) from previous admission -Repeat Lactate -trend trop -Follow-up blood cultures and obtain sputum culture and  gram stain  -Pt is already vaccinated against PCV13 and PSV23 -Pt counseled on tobacco cessation  -Consider pulmonary rehab on discharge and repeating PFT's (has been >1 yr)   Acute on chronic diastolic CHF - Pt with elevated BNP 441 with mild interstitial edema and noncompliant with lasix 40 mg daily on previous admission. BNP 326 today. Last echo on 06/07/14 with grade 1 diastolic dysfunction.  -lasix 40 mg PO daily -Monitor daily weights and strict I & O's  -Monitor BMP -Continue home carvedilol 12.5 mg BID  PAF s/p ablation - Pt currently in normal sinus rhythm and rate. INR 1.65 yesterday. -Continue home amiodarone 200 mg daily -Continue home carvedilol 12.5 mg BID  -Coumadin per pharmacy -Monitor INR daily   Hypertension - Hypertensive to 231/96 on arrival to the ED but improved on its own. Currently normotensive.  -Continue home carvedilol 12.5 mg BID  Polycythemia Vera JAK2 mutation positive - Hg 13.4 above baseline 11-12. Improved from 15.6 on 3/9. Pt at home on hydroxyurea.  -Monitor CBC -Continue home hydroxyurea 500 mg MWF and 1000 mg TTSS -Continue folic acid 1 mg daily  -Pt follows with Dr. Alvy Bimler   History of heroin abuse - Pt currently denies use. Pt at home on SL suboxone 8-12 mg daily. -Follow-up UDS -Continue subutex 8 mg daily (formulary change)  Diet: Regular   DVT Ppx: Coumadin   Code: Full  Dispo: Disposition is deferred at this time, awaiting  improvement of current medical problems. Anticipated discharge in approximately 1-3 day(s).   The patient does have a current PCP Jule Ser, DO) and does need an [facility] hospital follow-up appointment after discharge.  The patient does not have transportation limitations that hinder transportation to clinic appointments.  Signed: Maryellen Pile, MD 09/28/2015, 3:33 AM

## 2015-09-27 NOTE — Progress Notes (Signed)
Utilization Review Completed.  

## 2015-09-27 NOTE — Progress Notes (Signed)
Pt friend arrive with jacket and pants for pt to finish getting dressed.  Pt dressed and is transported off unit via wheelchair by NT.  Pt A&Ox4 with no acute distress noted.

## 2015-09-27 NOTE — Progress Notes (Signed)
ANTICOAGULATION CONSULT NOTE - Follow Up Consult  Pharmacy Consult for Coumadin Indication: atrial fibrillation  Allergies  Allergen Reactions  . Codeine Rash    Pt says ok    Patient Measurements: Height: 5\' 6"  (167.6 cm) Weight: 216 lb 0.8 oz (98 kg) IBW/kg (Calculated) : 63.8 Heparin Dosing Weight:   Vital Signs: Temp: 97.7 F (36.5 C) (03/10 1224) Temp Source: Oral (03/10 1224) BP: 111/60 mmHg (03/10 1224) Pulse Rate: 73 (03/10 1224)  Labs:  Recent Labs  09/26/15 0823 09/26/15 0923 09/26/15 1021 09/27/15 0320  HGB 15.6  --   --  13.1  HCT 47.1  --   --  40.1  PLT 188  --   --  134*  APTT  --   --  29  --   LABPROT  --  21.1*  --  19.5*  INR  --  1.83*  --  1.65*  CREATININE  --  1.46*  --  1.53*    Estimated Creatinine Clearance: 50.7 mL/min (by C-G formula based on Cr of 1.53).   Medications:  Scheduled:  . amiodarone  200 mg Oral Daily  . antiseptic oral rinse  7 mL Mouth Rinse BID  . budesonide (PULMICORT) nebulizer solution  0.25 mg Nebulization BID  . buprenorphine  8 mg Sublingual Daily  . carvedilol  12.5 mg Oral BID WC  . doxycycline  100 mg Oral Q12H  . folic acid  1 mg Oral Daily  . furosemide  40 mg Oral Daily  . hydroxyurea  1,000 mg Oral Once per day on Sun Tue Thu Sat  . hydroxyurea  500 mg Oral Q M,W,F  . ipratropium-albuterol  3 mL Nebulization Q4H while awake  . sodium chloride flush  3 mL Intravenous Q12H  . Warfarin - Pharmacist Dosing Inpatient   Does not apply q1800    Assessment: 70yo male admitted with COPD exacerbation, on Coumadin for AFib.  INR is subtherapeutic this AM.  No bleeding noted.  Goal of Therapy:  INR 2-3 Monitor platelets by anticoagulation protocol: Yes   Plan:  Coumadin 7.5mg  today Daily INR  Gracy Bruins, PharmD [facility]

## 2015-09-27 NOTE — ED Notes (Signed)
Initial pulse ox reading 31%- unable to obtain a good wave form initially but pt was in severe resp distress and was cyanotic.  Face and nail beds cyanotic on arrival to trauma room.

## 2015-09-27 NOTE — Progress Notes (Signed)
Pt ambulated in [place] 234ft with NT.  Pt was not wearing 02 and desat to 87% and when stopped to rest O2 sat increased to 94%.  Pt tolerated ambulation well.

## 2015-09-27 NOTE — Progress Notes (Signed)
Carelink Summary Report / Loop Recorder 

## 2015-09-27 NOTE — ED Notes (Signed)
Pt brought straight to trauma C from waiting area.  Per staff, pt was just discharged from upstairs and was in the process of leaving when he started having difficulty breathing and turning cyanotic.  On arrival to Trauma C pt unable to speak due to severe resp distress.  Pt's face and hands cyanotic.  EDP, resident, and RT called to bedside.  Pt placed on NRB @ 15L- O2 sats up to 91%.  Pt placed on Bi-pap by RT.

## 2015-09-28 ENCOUNTER — Encounter (HOSPITAL_COMMUNITY): Payer: Self-pay

## 2015-09-28 DIAGNOSIS — J9601 Acute respiratory failure with hypoxia: Secondary | ICD-10-CM | POA: Insufficient documentation

## 2015-09-28 DIAGNOSIS — F1199 Opioid use, unspecified with unspecified opioid-induced disorder: Secondary | ICD-10-CM

## 2015-09-28 DIAGNOSIS — I5033 Acute on chronic diastolic (congestive) heart failure: Secondary | ICD-10-CM

## 2015-09-28 DIAGNOSIS — D45 Polycythemia vera: Secondary | ICD-10-CM

## 2015-09-28 DIAGNOSIS — F17210 Nicotine dependence, cigarettes, uncomplicated: Secondary | ICD-10-CM

## 2015-09-28 DIAGNOSIS — J9602 Acute respiratory failure with hypercapnia: Secondary | ICD-10-CM | POA: Diagnosis not present

## 2015-09-28 DIAGNOSIS — R894 Abnormal immunological findings in specimens from other organs, systems and tissues: Secondary | ICD-10-CM

## 2015-09-28 DIAGNOSIS — J441 Chronic obstructive pulmonary disease with (acute) exacerbation: Principal | ICD-10-CM

## 2015-09-28 DIAGNOSIS — I110 Hypertensive heart disease with heart failure: Secondary | ICD-10-CM

## 2015-09-28 DIAGNOSIS — I480 Paroxysmal atrial fibrillation: Secondary | ICD-10-CM

## 2015-09-28 LAB — CUP PACEART REMOTE DEVICE CHECK: MDC IDC SESS DTM: 20170307080532

## 2015-09-28 LAB — TROPONIN I
TROPONIN I: 0.03 ng/mL (ref <0.031)
TROPONIN I: 0.03 ng/mL (ref <0.031)

## 2015-09-28 LAB — HEPATITIS C ANTIBODY (REFLEX)

## 2015-09-28 LAB — COMMENT2 - HEP PANEL

## 2015-09-28 LAB — PROTIME-INR
INR: 1.71 — ABNORMAL HIGH (ref 0.00–1.49)
PROTHROMBIN TIME: 20.1 s — AB (ref 11.6–15.2)

## 2015-09-28 LAB — RAPID URINE DRUG SCREEN, HOSP PERFORMED
AMPHETAMINES: NOT DETECTED
BENZODIAZEPINES: NOT DETECTED
Barbiturates: NOT DETECTED
COCAINE: NOT DETECTED
Opiates: NOT DETECTED
Tetrahydrocannabinol: NOT DETECTED

## 2015-09-28 LAB — LACTIC ACID, PLASMA: Lactic Acid, Venous: 2.2 mmol/L (ref 0.5–2.0)

## 2015-09-28 MED ORDER — IPRATROPIUM-ALBUTEROL 0.5-2.5 (3) MG/3ML IN SOLN
3.0000 mL | Freq: Four times a day (QID) | RESPIRATORY_TRACT | Status: DC
  Administered 2015-09-28 – 2015-09-29 (×7): 3 mL via RESPIRATORY_TRACT
  Filled 2015-09-28 (×7): qty 3

## 2015-09-28 MED ORDER — HYDROXYUREA 500 MG PO CAPS: 500.0000 mg | ORAL_CAPSULE | ORAL | Status: DC

## 2015-09-28 MED ORDER — HYDROXYUREA 500 MG PO CAPS: 500.0000 mg | ORAL_CAPSULE | Freq: Every day | ORAL | Status: DC

## 2015-09-28 MED ORDER — PREDNISONE 50 MG PO TABS
50.0000 mg | ORAL_TABLET | Freq: Every day | ORAL | Status: DC
  Administered 2015-09-28 – 2015-09-29 (×2): 50 mg via ORAL
  Filled 2015-09-28 (×2): qty 1

## 2015-09-28 MED ORDER — WARFARIN - PHARMACIST DOSING INPATIENT: Freq: Every day | Status: DC

## 2015-09-28 MED ORDER — FUROSEMIDE 40 MG PO TABS
40.0000 mg | ORAL_TABLET | Freq: Every day | ORAL | Status: DC
  Administered 2015-09-28 – 2015-09-29 (×2): 40 mg via ORAL
  Filled 2015-09-28 (×2): qty 1

## 2015-09-28 MED ORDER — DOXYCYCLINE HYCLATE 100 MG PO TABS
100.0000 mg | ORAL_TABLET | Freq: Two times a day (BID) | ORAL | Status: DC
  Administered 2015-09-28 – 2015-09-29 (×3): 100 mg via ORAL
  Filled 2015-09-28 (×3): qty 1

## 2015-09-28 MED ORDER — BUPRENORPHINE HCL 8 MG SL SUBL
8.0000 mg | SUBLINGUAL_TABLET | Freq: Every day | SUBLINGUAL | Status: DC
  Administered 2015-09-28 – 2015-09-29 (×2): 8 mg via SUBLINGUAL
  Filled 2015-09-28 (×2): qty 1

## 2015-09-28 MED ORDER — HYDROXYUREA 500 MG PO CAPS
1000.0000 mg | ORAL_CAPSULE | ORAL | Status: DC
Start: 2015-09-28 — End: 2015-09-29
  Administered 2015-09-28 – 2015-09-29 (×2): 1000 mg via ORAL
  Filled 2015-09-28 (×2): qty 2

## 2015-09-28 MED ORDER — ACETAMINOPHEN 325 MG PO TABS: 650.0000 mg | ORAL_TABLET | Freq: Four times a day (QID) | ORAL | Status: DC | PRN

## 2015-09-28 MED ORDER — AMIODARONE HCL 200 MG PO TABS
200.0000 mg | ORAL_TABLET | Freq: Every day | ORAL | Status: DC
  Administered 2015-09-28 – 2015-09-29 (×2): 200 mg via ORAL
  Filled 2015-09-28 (×2): qty 1

## 2015-09-28 MED ORDER — SODIUM CHLORIDE 0.9% FLUSH: 3.0000 mL | INTRAVENOUS | Status: DC | PRN

## 2015-09-28 MED ORDER — CARVEDILOL 12.5 MG PO TABS
12.5000 mg | ORAL_TABLET | Freq: Every day | ORAL | Status: DC
  Administered 2015-09-28 – 2015-09-29 (×2): 12.5 mg via ORAL
  Filled 2015-09-28 (×2): qty 1

## 2015-09-28 MED ORDER — WARFARIN SODIUM 5 MG PO TABS
5.0000 mg | ORAL_TABLET | Freq: Once | ORAL | Status: AC
  Administered 2015-09-28: 5 mg via ORAL
  Filled 2015-09-28: qty 1

## 2015-09-28 MED ORDER — WHITE PETROLATUM GEL
Status: AC
Start: 2015-09-28 — End: 2015-09-29
  Filled 2015-09-28: qty 1

## 2015-09-28 MED ORDER — SODIUM CHLORIDE 0.9% FLUSH
3.0000 mL | Freq: Two times a day (BID) | INTRAVENOUS | Status: DC
  Administered 2015-09-28 – 2015-09-29 (×4): 3 mL via INTRAVENOUS

## 2015-09-28 MED ORDER — ACETAMINOPHEN 650 MG RE SUPP: 650.0000 mg | Freq: Four times a day (QID) | RECTAL | Status: DC | PRN

## 2015-09-28 MED ORDER — FOLIC ACID 1 MG PO TABS
1.0000 mg | ORAL_TABLET | Freq: Every day | ORAL | Status: DC
  Administered 2015-09-28 – 2015-09-29 (×2): 1 mg via ORAL
  Filled 2015-09-28 (×2): qty 1

## 2015-09-28 NOTE — Progress Notes (Signed)
Attempted to call report to [place], nurse at lunch at this time , left name and number for return call. Consuelo Pandy RN

## 2015-09-28 NOTE — ED Notes (Signed)
Admitting MD at bedside.  Report called to [place], Therapist, sports.  Per RT, admitting MD request for pt to have Duoneb prior to transport to SDU.

## 2015-09-28 NOTE — Progress Notes (Signed)
Subjective: Pt getting breathing treatment in the room. Speaking in full sentences. He states he had worsening SOB when he got out in the cold weather last night. Home inhalers did not help. Currently he denies any complaints.   Objective: Vital signs in last 24 hours: Filed Vitals:   09/28/15 0230 09/28/15 0315 09/28/15 0725 09/28/15 0843  BP: 136/79 139/81 100/71   Pulse: 65 85 110 100  Temp:  98.3 F (36.8 C) 97.5 F (36.4 C)   TempSrc:  Oral Oral   Resp: 11 16 22 18   Height:      Weight:      SpO2: 95% 96% 95% 94%   Weight change:   Intake/Output Summary (Last 24 hours) at 09/28/15 0953 Last data filed at 09/28/15 0800  Gross per 24 hour  Intake    240 ml  Output    800 ml  Net   -560 ml   General: NAD, sitting up in bed comfortably Lungs: faint wheezing, good air movement Cardiac: RRR, no murmurs GI: soft, active bowel sounds Neuro: CN II-XII grossly intact Ext: neg for pedal edema Skin: warm and dry  Lab Results: Basic Metabolic Panel:  Recent Labs Lab 09/27/15 0320 09/27/15 2020  NA 139 140  K 3.8 4.5  CL 103 101  CO2 25 26  GLUCOSE 213* 182*  BUN 22* 27*  CREATININE 1.53* 1.44*  CALCIUM 8.6* 8.5*   Liver Function Tests:  Recent Labs Lab 09/26/15 0923 09/27/15 0320  AST 50* 29  ALT 27 23  ALKPHOS 103 87  BILITOT 0.7 0.6  PROT 6.6 6.6  ALBUMIN 3.0* 2.9*   CBC:  Recent Labs Lab 09/27/15 0320 09/27/15 2020  WBC 7.1 15.2*  NEUTROABS 6.6 14.3*  HGB 13.1 13.4  HCT 40.1 41.4  MCV 117.9* 118.3*  PLT 134* 153   Cardiac Enzymes:  Recent Labs Lab 09/27/15 2020 09/28/15 0414  TROPONINI 0.09* 0.03   CBG:  Recent Labs Lab 09/27/15 1939  GLUCAP 163*   Coagulation:  Recent Labs Lab 09/23/15 1026 09/26/15 0923 09/27/15 0320 09/28/15 0414  LABPROT  --  21.1* 19.5* 20.1*  INR 2.0 1.83* 1.65* 1.71*    Studies/Results: Dg Chest 2 View  09/27/2015  CLINICAL DATA:  Patient with shortness breath and cough. EXAM: CHEST  2  VIEW COMPARISON:  Chest radiograph 09/26/2015. FINDINGS: Monitoring leads overlie the patient. Stable enlarged cardiac and mediastinal contours. No consolidative pulmonary opacities. No pleural effusion or pneumothorax. Thoracic spine degenerative changes. IMPRESSION: No acute cardiopulmonary process. Electronically Signed   By: Lovey Newcomer M.D.   On: 09/27/2015 09:41   Dg Chest Port 1 View  09/27/2015  CLINICAL DATA:  Dyspnea EXAM: PORTABLE CHEST 1 VIEW COMPARISON:  09/27/2015 FINDINGS: Cardiac enlargement.  Cardiac loop recorder unchanged. Increase and vascular congestion since the prior study suggesting fluid overload with mild interstitial edema. Small right pleural effusion. IMPRESSION: Interval development of vascular congestion and mild edema since earlier today. Electronically Signed   By: Franchot Gallo M.D.   On: 09/27/2015 19:52   Medications: I have reviewed the patient's current medications. Scheduled Meds: . amiodarone  200 mg Oral Daily  . buprenorphine  8 mg Sublingual Daily  . carvedilol  12.5 mg Oral Q breakfast  . doxycycline  100 mg Oral Q12H  . folic acid  1 mg Oral Daily  . furosemide  40 mg Oral Daily  . hydroxyurea  1,000 mg Oral Once per day on Sun Tue Thu Sat  . [  START ON 09/30/2015] hydroxyurea  500 mg Oral Once per day on Mon Wed Fri  . ipratropium-albuterol  3 mL Nebulization Q6H  . predniSONE  50 mg Oral Q breakfast  . sodium chloride flush  3 mL Intravenous Q12H  . warfarin  5 mg Oral ONCE-1800  . Warfarin - Pharmacist Dosing Inpatient   Does not apply q1800   Continuous Infusions:  PRN Meds:.acetaminophen **OR** acetaminophen, sodium chloride flush Assessment/Plan: Principal Problem:   Acute hypercapnic respiratory failure (HCC) Active Problems:   Atrial fibrillation (HCC)   Cardiomyopathy, alcoholic (HCC)   Hypertension   Polycythemia vera ([place])   Myeloproliferative neoplasm (HCC)   COPD (chronic obstructive pulmonary disease) (HCC)   Chronic systolic  heart failure (HCC)   COPD exacerbation (HCC)  Acute hypoxic/hypercapnic respiratory failure in setting of GOLD Stage 3 COPD exacerbation: improved since admission. Likely provoked by cold weather.  -transfer to [place] as needed -Oxygen therapy to keep SpO2 88-90% -Prednisone 50 mg daily  -Duoneb q6hr -Pulmicort nebulizer 0.25 mg BID, hold home spiriva with duoneb use -Continue doxycyline 100 mg BID  -Follow-up blood cultures and obtain sputum culture and gram stain from 3/9, currently NGTD    Acute on chronic diastolic CHF - CXR on admission + for interval development of vascular congestion. No crackles on exam and patient appears euvolemic. BNP 326.4 yesterday decreased from 3/9 when it was 441.4.  -lasix 40 mg PO daily -Monitor daily weights and strict I & O's  -Monitor BMP -Continue home carvedilol 12.5 mg BID  PAF s/p ablation - Pt currently in normal sinus rhythm and rate. -Continue home amiodarone 200 mg daily -Continue home carvedilol 12.5 mg BID  -Coumadin per pharmacy -Monitor INR daily   Hypertension - Hypertensive to 231/96 on arrival to the ED but improved on its own. Currently normotensive.  -Continue home carvedilol 12.5 mg BID  Dispo: Disposition is deferred at this time, awaiting improvement of current medical problems.    The patient does have a current PCP Jule Ser, DO) and does need an [facility] hospital follow-up appointment after discharge.  The patient does not have transportation limitations that hinder transportation to clinic appointments.  .Services Needed at time of discharge: Y = Yes, Blank = No PT:   OT:   RN:   Equipment:   Other:     LOS: 1 day   Norman Herrlich, MD 09/28/2015, 9:53 AM

## 2015-09-28 NOTE — ED Notes (Signed)
Awaiting RT

## 2015-09-28 NOTE — Progress Notes (Signed)
ANTICOAGULATION CONSULT NOTE - Initial Consult  Pharmacy Consult for Coumadin Indication: atrial fibrillation  Allergies  Allergen Reactions  . Codeine Rash    Pt says ok    Vital Signs: Temp: 97.8 F (36.6 C) (03/10 1737) Temp Source: Oral (03/10 1737) BP: 143/78 mmHg (03/11 0100) Pulse Rate: 68 (03/11 0100)  Labs:  Recent Labs  09/26/15 0823 09/26/15 0923 09/26/15 1021 09/27/15 0320 09/27/15 2020  HGB 15.6  --   --  13.1 13.4  HCT 47.1  --   --  40.1 41.4  PLT 188  --   --  134* 153  APTT  --   --  29  --   --   LABPROT  --  21.1*  --  19.5*  --   INR  --  1.83*  --  1.65*  --   CREATININE  --  1.46*  --  1.53* 1.44*  TROPONINI  --   --   --   --  0.09*    Estimated Creatinine Clearance: 53.8 mL/min (by C-G formula based on Cr of 1.44).   Medical History: Past Medical History  Diagnosis Date  . Cardiomyopathy     EF55% 11/14<<35%   . Substance abuse     alcohol  . Hypertension   . Atrial fibrillation /flutter     hx of flutter ablation 2/12  . Shortness of breath     "all the time lately" (12/14/2012)  . GERD (gastroesophageal reflux disease)   . Hepatitis     "think I had that once; a long time ago; from dirty needles I think" (12/14/2012)  . Primary polycythemia ([place]) 06/12/2013  . Leukocytosis, unspecified 06/12/2013  . 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 Obesity   . Syncope   . Cancer ([place])   . CHF (congestive heart failure) ([place])   . Renal insufficiency   . COPD (chronic obstructive pulmonary disease) [facility])      Assessment: 70yo male returned to ED not long after discharge from short hospital stay for COPD exacerbation, now to be admitted again, to continue Coumadin for Afib; INR below goal with last Coumadin dose taken 3/10 in hospital prior to discharge.  Goal of Therapy:  INR 2-3   Plan:  Will give boosted Coumadin dose of 5mg  po x1 today and monitor INR for dose adjustments.  Wynona Neat, PharmD, BCPS  09/28/2015,1:38  AM

## 2015-09-28 NOTE — Progress Notes (Signed)
Pt transferred from ED to 3s13 after report received from [facility] ED RN.  Pt currently on 2L [place] with slight expiratory wheezing, Bipap is bedside as needed.  Pt is A&Ox 4 with call bell within reach.  Will continue to monitor.

## 2015-09-28 NOTE — Progress Notes (Signed)
Carelink summary report received. Battery status OK. Normal device function. No new symptom episodes, tachy episodes, brady, or pause episodes. 0.3% AF, +Warfarin.  Monthly summary reports and ROV/PRN

## 2015-09-28 NOTE — Progress Notes (Signed)
Gave report to nurse on [place], will transfer pt on telemetry , room air , in wheelchair. Consuelo Pandy RN

## 2015-09-29 DIAGNOSIS — J9602 Acute respiratory failure with hypercapnia: Secondary | ICD-10-CM | POA: Diagnosis not present

## 2015-09-29 DIAGNOSIS — R768 Other specified abnormal immunological findings in serum: Secondary | ICD-10-CM | POA: Insufficient documentation

## 2015-09-29 LAB — CBC
HCT: 40 % (ref 39.0–52.0)
Hemoglobin: 13.1 g/dL (ref 13.0–17.0)
MCH: 38.8 pg — AB (ref 26.0–34.0)
MCHC: 32.8 g/dL (ref 30.0–36.0)
MCV: 118.3 fL — ABNORMAL HIGH (ref 78.0–100.0)
PLATELETS: 151 10*3/uL (ref 150–400)
RBC: 3.38 MIL/uL — ABNORMAL LOW (ref 4.22–5.81)
RDW: 14 % (ref 11.5–15.5)
WBC: 14 10*3/uL — ABNORMAL HIGH (ref 4.0–10.5)

## 2015-09-29 LAB — PROTIME-INR
INR: 2.21 — ABNORMAL HIGH (ref 0.00–1.49)
PROTHROMBIN TIME: 24.3 s — AB (ref 11.6–15.2)

## 2015-09-29 MED ORDER — WARFARIN SODIUM 2.5 MG PO TABS: 2.5000 mg | ORAL_TABLET | Freq: Once | ORAL | Status: DC

## 2015-09-29 NOTE — Progress Notes (Signed)
Pt ambulated approx 300 feet on RA, O2 sats ranged from 94-97%.

## 2015-09-29 NOTE — Progress Notes (Signed)
ANTICOAGULATION CONSULT NOTE - Follow up [place] for Coumadin Indication: atrial fibrillation  Vital Signs: Temp: 97.7 F (36.5 C) (03/12 0510) Temp Source: Oral (03/12 0510) BP: 125/70 mmHg (03/12 0510) Pulse Rate: 59 (03/12 0510)  Labs:  Recent Labs  09/27/15 0320 09/27/15 2020 09/28/15 0414 09/28/15 1024 09/29/15 0755  HGB 13.1 13.4  --   --  13.1  HCT 40.1 41.4  --   --  40.0  PLT 134* 153  --   --  151  LABPROT 19.5*  --  20.1*  --  24.3*  INR 1.65*  --  1.71*  --  2.21*  CREATININE 1.53* 1.44*  --   --   --   TROPONINI  --  0.09* 0.03 0.03  --     Assessment: 70yo male returned to ED not long after discharge from short hospital stay for COPD exacerbation, now to be admitted again, to continue Coumadin for Afib; INR below goal with last Coumadin dose taken 3/10 in hospital prior to discharge. INR 2.21, CBC stable with no sxs of bleeding.  PTA dose: 2.5 mg daily. Except 1.25 mg every Monday and Friday  Goal of Therapy:  INR 2-3   Plan:  1. Warfarin 2.5 mg x 1 tonight; if home today would resume home dosing regimen of 2.5 mg daily., except 1.25 mg every Monday and Friday 2. Daily INR and CBC while inpatient    Vincenza Hews, PharmD, BCPS 09/29/2015, 11:25 AM Pager: [phone]

## 2015-09-29 NOTE — Discharge Summary (Signed)
Name: [name] MRN: [id number] DOB: 26-Aug-1945 70 y.o. PCP: Jule Ser, DO  Date of Admission: 09/27/2015  7:32 PM Date of Discharge: 09/29/2015 Attending Physician: Truman Hayward, MD  Discharge Diagnosis: Principal Problem:   Acute hypercapnic respiratory failure [facility]) Active Problems:   Atrial fibrillation ([place])   Cardiomyopathy, alcoholic ([place])   Hypertension   Polycythemia vera ([place])   Myeloproliferative neoplasm ([place])   COPD (chronic obstructive pulmonary disease) ([place])   Chronic systolic heart failure (HCC)   COPD exacerbation (HCC)   Acute respiratory failure with hypoxia (HCC)   COPD with exacerbation ([place])  Discharge Medications:   Medication List    ASK your doctor about these medications        albuterol 108 (90 Base) MCG/ACT inhaler  Commonly known as:  PROAIR HFA  Inhale 1-2 puffs into the lungs every 6 (six) hours as needed for wheezing or shortness of breath. PLACE ON HOLD UNTIL PATIENT REQUESTS TO FILL     amiodarone 200 MG tablet  Commonly known as:  PACERONE  Take 1 tablet (200 mg total) by mouth daily.     beclomethasone 80 MCG/ACT inhaler  Commonly known as:  QVAR  Inhale 1 puff into the lungs 2 (two) times daily. PLACE ON HOLD UNTIL PATIENT REQUESTS TO FILL     carvedilol 12.5 MG tablet  Commonly known as:  COREG  Take 12.5 mg by mouth daily.     doxycycline 100 MG tablet  Commonly known as:  VIBRA-TABS  Take 1 tablet (100 mg total) by mouth every 12 (twelve) hours.     folic acid 1 MG tablet  Commonly known as:  FOLVITE  Take 1 tablet (1 mg total) by mouth daily.     furosemide 40 MG tablet  Commonly known as:  LASIX  Take 1 tablet (40 mg total) by mouth daily.     hydroxyurea 500 MG capsule  Commonly known as:  HYDREA  Take 2 capsules (1,000 mg total) by mouth daily. May take with food to minimize GI side effects.     hydroxyurea 500 MG capsule  Commonly known as:  HYDREA  take 3 capsules by mouth once  daily with food TO MINIMIZE GI SIDE EFFECTS     multivitamin with minerals Tabs tablet  Take 1 tablet by mouth daily.     predniSONE 50 MG tablet  Commonly known as:  DELTASONE  Take one tablet by mouth once per day for 4 days total     SUBOXONE 8-2 MG Film  Generic drug:  Buprenorphine HCl-Naloxone HCl  Take 1 Film by mouth daily.     tiotropium 18 MCG inhalation capsule  Commonly known as:  SPIRIVA HANDIHALER  Place 1 capsule (18 mcg total) into inhaler and inhale daily. PLACE ON HOLD UNTIL PATIENT REQUESTS TO FILL     warfarin 2.5 MG tablet  Commonly known as:  COUMADIN  Take 1 tablet (2.5 mg total) by mouth daily. Except skip dose on Tuesdays. PHARMACIST NOTE: DOSE AND TABLET STRENGTH CHANGE. PLEASE VOID PREVIOUS RX ON FILE FOR WARFARIN.        Disposition and follow-up:   Mr.Helder Demontrey Davydov was discharged from [facility] in Good condition.  At the hospital follow up visit please address:  1.  Please ensure patient completed his course of abx and prednisone, END date of 09/30/15.   2.  Labs / imaging needed at time of follow-up: none  3.  Pending labs/ test  needing follow-up: none  Procedures Performed:  Dg Chest 2 View  09/27/2015  CLINICAL DATA:  Patient with shortness breath and cough. EXAM: CHEST  2 VIEW COMPARISON:  Chest radiograph 09/26/2015. FINDINGS: Monitoring leads overlie the patient. Stable enlarged cardiac and mediastinal contours. No consolidative pulmonary opacities. No pleural effusion or pneumothorax. Thoracic spine degenerative changes. IMPRESSION: No acute cardiopulmonary process. Electronically Signed   By: Lovey Newcomer M.D.   On: 09/27/2015 09:41   Dg Chest Port 1 View  09/27/2015  CLINICAL DATA:  Dyspnea EXAM: PORTABLE CHEST 1 VIEW COMPARISON:  09/27/2015 FINDINGS: Cardiac enlargement.  Cardiac loop recorder unchanged. Increase and vascular congestion since the prior study suggesting fluid overload with mild interstitial edema.  Small right pleural effusion. IMPRESSION: Interval development of vascular congestion and mild edema since earlier today. Electronically Signed   By: Franchot Gallo M.D.   On: 09/27/2015 19:52   Dg Chest Portable 1 View  09/26/2015  CLINICAL DATA:  Shortness of Breath EXAM: PORTABLE CHEST 1 VIEW COMPARISON:  12/04/2014 FINDINGS: Cardiomediastinal silhouette is stable. Mild perihilar bilateral interstitial prominence left greater than right. Mild basilar atelectasis. Findings suspicious for mild interstitial edema. Interstitial pneumonitis cannot be excluded. IMPRESSION: Mild perihilar bilateral interstitial prominence left greater than right. Findings suspicious for interstitial edema or bilateral pneumonitis. Mild basilar atelectasis. Electronically Signed   By: Lahoma Crocker M.D.   On: 09/26/2015 08:46     Admission HPI: Mr. Sharieff Corbello is a 70 year old male with a past medical history of COPD Gold Stage 3, polycythemia vera HTN, PAF on warfarin, diastolic CHF, history of heroin abuse and tobacco abuse who presents today with shortness of breath. He was admitted on 3/9 with a one week history of shortness of breath, wheezing and productive cough. On arrival at that time he was hypoxic requiring BiPAP with hypercapnia and respiratory acidosis. He responded well to BiPAP and was doing well on RA this morning and afternoon prior to discharge with no shortness of breath.   After discharge this afternoon, he was in the car diving home and became acutely short of breath. He reports trying both the albuterol and asmanex inhalers provided for him at discharge with no improvement in symptoms. He promptly came back to the ED for evaluation. In the ED, he was found to be in acute respiratory distress only able to speak 1-2 words at a time. ABG revealed a gas of 01/17/69/347/22. He was placed on BiPAP with good response. Repeat ABG was improved at 7.32/50.5/205/26.5. He was able to come off BiPAP and breathing  comfortably on 2L nasal canula. Able to carry on a normal conversation. Denies any further shortness of breath. Denies any chest pain, abdominal pain, nausea/vomiting, fever, chills, headache.  Hospital Course by problem list: Principal Problem:   Acute hypercapnic respiratory failure (HCC) Active Problems:   Atrial fibrillation (HCC)   Cardiomyopathy, alcoholic ([place])   Hypertension   Polycythemia vera ([place])   Myeloproliferative neoplasm (HCC)   COPD (chronic obstructive pulmonary disease) (HCC)   Chronic systolic heart failure (HCC)   COPD exacerbation (HCC)   Acute respiratory failure with hypoxia (HCC)   COPD with exacerbation (HCC)   Acute hypoxic/hypercapnic respiratory failure in setting of GOLD Stage 3 COPD exacerbation: Patient discharged the afternoon of readmission and returned with acute hypoxic/hypercapneic respiratory failure. He required BiPAP in the ED with quick resolution of his symptoms. Initial ABG 7.1/70/347/22 with improvement to 7.32/50.5/205/26.5 after 30 minutes of BiPAP. He no longer required BIPAP the  rest of his hospital course and with rapid improvement of breathing the next morning. He received dexamethasone 10 mg IV in the ED and then prednisone 50mg  po qd was continue as well as doxycycline 100mg  BID that was started last admission. The likely trigger of his acute COPD exacerbation was the cold weather change versus anxiety. On the day of discharge he was ambulating independently in the hallway and felt his breathing was back to baseline. His pulse ox remained above 94% while ambulating with pulse ox w/ RN.   Acute on chronic diastolic CHF -  Last echo on 06/07/14 with grade 1 diastolic dysfunction. CXR revealed interval development of vascular congestion and mild edema. BNP elevated at 326.4, however improved from the day prior. His home lasix 40mg  po was continued. He was net negative 752ml on discharge.  Discharge Vitals:   BP 125/70 mmHg  Pulse 59  Temp(Src)  97.7 F (36.5 C) (Oral)  Resp 20  Ht 5\' 6"  (1.676 m)  Wt 222 lb 14.2 oz (101.1 kg)  BMI 35.99 kg/m2  SpO2 96%  Discharge Labs:  Results for orders placed or performed during the hospital encounter of 09/27/15 (from the past 24 hour(s))  Protime-INR     Status: Abnormal   Collection Time: 09/29/15  7:55 AM  Result Value Ref Range   Prothrombin Time 24.3 (H) 11.6 - 15.2 seconds   INR 2.21 (H) 0.00 - 1.49  CBC     Status: Abnormal   Collection Time: 09/29/15  7:55 AM  Result Value Ref Range   WBC 14.0 (H) 4.0 - 10.5 K/uL   RBC 3.38 (L) 4.22 - 5.81 MIL/uL   Hemoglobin 13.1 13.0 - 17.0 g/dL   HCT 40.0 39.0 - 52.0 %   MCV 118.3 (H) 78.0 - 100.0 fL   MCH 38.8 (H) 26.0 - 34.0 pg   MCHC 32.8 30.0 - 36.0 g/dL   RDW 14.0 11.5 - 15.5 %   Platelets 151 150 - 400 K/uL    Signed: Norman Herrlich, MD 09/29/2015, 11:07 AM    Services Ordered on Discharge: none Equipment Ordered on Discharge: none

## 2015-09-29 NOTE — Discharge Instructions (Signed)
Take doxycycline once today and then twice a day for one more day (your last day should be tomorrow night, March 13th).  Take prednisone 50mg  once tomorrow morning and then stop.   Make sure to call the clinic tomorrow morning to schedule a hospital follow up appointment.

## 2015-09-29 NOTE — Progress Notes (Signed)
Subjective: Denies any complaints, states his breathing is back to normal. Seen while ambulating alone in the hallway.   Objective: Vital signs in last 24 hours: Filed Vitals:   09/29/15 0203 09/29/15 0508 09/29/15 0510 09/29/15 0806  BP:   125/70   Pulse: 91  59   Temp:   97.7 F (36.5 C)   TempSrc:   Oral   Resp: 20  20   Height:      Weight:  222 lb 14.2 oz (101.1 kg)    SpO2: 94%  96% 96%   Weight change: 6 lb 13.4 oz (3.1 kg)  Intake/Output Summary (Last 24 hours) at 09/29/15 1043 Last data filed at 09/29/15 0733  Gross per 24 hour  Intake    560 ml  Output   1000 ml  Net   -440 ml   General: NAD, seen walking in the hallway w/o difficulty on room air, sitting up in bed comfortably Lungs: faint wheezing, good air movement Cardiac: RRR, no murmurs GI: soft, active bowel sounds Neuro: CN II-XII grossly intact Ext: neg for pedal edema Skin: warm and dry  Lab Results: Basic Metabolic Panel:  Recent Labs Lab 09/27/15 0320 09/27/15 2020  NA 139 140  K 3.8 4.5  CL 103 101  CO2 25 26  GLUCOSE 213* 182*  BUN 22* 27*  CREATININE 1.53* 1.44*  CALCIUM 8.6* 8.5*   Liver Function Tests:  Recent Labs Lab 09/26/15 0923 09/27/15 0320  AST 50* 29  ALT 27 23  ALKPHOS 103 87  BILITOT 0.7 0.6  PROT 6.6 6.6  ALBUMIN 3.0* 2.9*   CBC:  Recent Labs Lab 09/27/15 0320 09/27/15 2020 09/29/15 0755  WBC 7.1 15.2* 14.0*  NEUTROABS 6.6 14.3*  --   HGB 13.1 13.4 13.1  HCT 40.1 41.4 40.0  MCV 117.9* 118.3* 118.3*  PLT 134* 153 151   Cardiac Enzymes:  Recent Labs Lab 09/27/15 2020 09/28/15 0414 09/28/15 1024  TROPONINI 0.09* 0.03 0.03   CBG:  Recent Labs Lab 09/27/15 1939  GLUCAP 163*   Coagulation:  Recent Labs Lab 09/26/15 0923 09/27/15 0320 09/28/15 0414 09/29/15 0755  LABPROT 21.1* 19.5* 20.1* 24.3*  INR 1.83* 1.65* 1.71* 2.21*    Studies/Results: Dg Chest Port 1 View  09/27/2015  CLINICAL DATA:  Dyspnea EXAM: PORTABLE CHEST 1 VIEW  COMPARISON:  09/27/2015 FINDINGS: Cardiac enlargement.  Cardiac loop recorder unchanged. Increase and vascular congestion since the prior study suggesting fluid overload with mild interstitial edema. Small right pleural effusion. IMPRESSION: Interval development of vascular congestion and mild edema since earlier today. Electronically Signed   By: Franchot Gallo M.D.   On: 09/27/2015 19:52   Medications: I have reviewed the patient's current medications. Scheduled Meds: . amiodarone  200 mg Oral Daily  . buprenorphine  8 mg Sublingual Daily  . carvedilol  12.5 mg Oral Q breakfast  . doxycycline  100 mg Oral Q12H  . folic acid  1 mg Oral Daily  . furosemide  40 mg Oral Daily  . hydroxyurea  1,000 mg Oral Once per day on Sun Tue Thu Sat  . [START ON 09/30/2015] hydroxyurea  500 mg Oral Once per day on Mon Wed Fri  . ipratropium-albuterol  3 mL Nebulization Q6H  . predniSONE  50 mg Oral Q breakfast  . sodium chloride flush  3 mL Intravenous Q12H  . Warfarin - Pharmacist Dosing Inpatient   Does not apply q1800   Continuous Infusions:  PRN Meds:.acetaminophen **OR** acetaminophen, sodium  chloride flush Assessment/Plan: Principal Problem:   Acute hypercapnic respiratory failure (HCC) Active Problems:   Atrial fibrillation (HCC)   Cardiomyopathy, alcoholic ([place])   Hypertension   Polycythemia vera ([place])   Myeloproliferative neoplasm (HCC)   COPD (chronic obstructive pulmonary disease) (HCC)   Chronic systolic heart failure (HCC)   COPD exacerbation (HCC)   Acute respiratory failure with hypoxia (HCC)   COPD with exacerbation (HCC)  Acute hypoxic/hypercapnic respiratory failure in setting of GOLD Stage 3 COPD exacerbation: resolved, likely from acute weather change or anxiety. Has not required BIPAP since yesterday.  -Oxygen therapy to keep SpO2 88-90% -Prednisone 50 mg daily  -Duoneb q6hr -Pulmicort nebulizer 0.25 mg BID, hold home spiriva with duoneb use -Continue doxycyline 100 mg  BID, last day should be tomorrow -Follow-up blood cultures and obtain sputum culture and gram stain from 3/9, currently NGTD - check pulse ox while ambulating    Acute on chronic diastolic CHF - net negative -760 ml since admission. Breathing has improved. Appears euvolemic.  -lasix 40 mg PO daily -Monitor daily weights and strict I & O's  -Monitor BMP -Continue home carvedilol 12.5 mg BID  PAF s/p ablation - Pt currently in normal sinus rhythm and rate. -Continue home amiodarone 200 mg daily -Continue home carvedilol 12.5 mg BID  -Coumadin per pharmacy  Hypertension - Hypertensive to 231/96 on arrival to the ED but improved on its own. Currently normotensive.  -Continue home carvedilol 12.5 mg BID  Dispo: discharge home today if O2 sat remain stable.   The patient does have a current PCP Jule Ser, DO) and does need an [facility] hospital follow-up appointment after discharge.  The patient does not have transportation limitations that hinder transportation to clinic appointments.  .Services Needed at time of discharge: Y = Yes, Blank = No PT:   OT:   RN:   Equipment:   Other:     LOS: 2 days   Norman Herrlich, MD 09/29/2015, 10:43 AM

## 2015-10-01 LAB — CULTURE, BLOOD (ROUTINE X 2)
Culture: NO GROWTH
Culture: NO GROWTH

## 2015-10-03 ENCOUNTER — Ambulatory Visit: Payer: Medicare Other | Admitting: Internal Medicine

## 2015-10-03 ENCOUNTER — Ambulatory Visit (INDEPENDENT_AMBULATORY_CARE_PROVIDER_SITE_OTHER): Payer: Medicare Other | Admitting: Pharmacist

## 2015-10-03 ENCOUNTER — Other Ambulatory Visit: Payer: Self-pay | Admitting: Pharmacist

## 2015-10-03 ENCOUNTER — Encounter: Payer: Self-pay | Admitting: Pharmacist

## 2015-10-03 DIAGNOSIS — Z7901 Long term (current) use of anticoagulants: Secondary | ICD-10-CM | POA: Diagnosis not present

## 2015-10-03 DIAGNOSIS — I480 Paroxysmal atrial fibrillation: Secondary | ICD-10-CM

## 2015-10-03 DIAGNOSIS — I4891 Unspecified atrial fibrillation: Secondary | ICD-10-CM

## 2015-10-03 LAB — POCT INR: INR: 2.1

## 2015-10-03 NOTE — Progress Notes (Signed)
Patient was seen in clinic by Rob Vincent, PharmD, PGY1 pharmacy resident. I agree with the assessment and plan of care documented by Dr. Vincent. 

## 2015-10-03 NOTE — Progress Notes (Signed)
Presents for drug therapy education. All current medications brought to visit except for inhalers.  Medication(s) reviewed with the patient, including name, instructions, indication, goals of therapy, potential side effects, importance of adherence, and safe use.  Patient reports no known adherence challenges.  Reports feeling better although breathing only moderately improved from recent hospitalization for COPD exacerbation. States he has been compliant with all medications and is able to verbally describe how he is taking each. Expresses no concerns in regard to proper operation of inhalers. Pt reports he quit smoking yesterday. Offered help in regard to smoking cessation but patient would like to continue to try on his own for now.   Patient verbalized understanding by repeating back information and was advised to contact me if further medication-related questions arise or if he would like to consider smoking cessation therapy. Patient was provided an information handout.  Stephens November, PharmD Clinical Pharmacy Resident

## 2015-10-03 NOTE — Patient Instructions (Signed)
Dose response handout given

## 2015-10-03 NOTE — Progress Notes (Signed)
Anticoagulation Management [name] is a 70 y.o. male who reports to the clinic for monitoring of warfarin treatment.    Indication: atrial fibrillation Duration: indefinite  Anticoagulation Clinic Visit History: Patient does report signs/symptoms of bleeding or thromboembolism   Anticoagulation Episode Summary    Current INR goal 2.0-3.0  Next INR check 10/21/2015  INR from last check 2.1 (10/03/2015)  Weekly max dose   Target end date Indefinite  INR check location [facility]  Preferred lab   Send INR reminders to    Indications  Atrial fibrillation ([place]) [I48.91]        Comments        ASSESSMENT Recent Results: The most recent result is correlated with 20 mg per week: Lab Results  Component Value Date   INR 2.1 10/03/2015   INR 2.21* 09/29/2015   INR 1.71* 09/28/2015    Anticoagulation Dosing: INR as of 10/03/2015 and Previous Dosing Information    INR Dt INR Goal [name] [name]   10/03/2015 2.1 2.0-3.0 15 mg 2.5 mg 1.25 mg 2.5 mg 2.5 mg 2.5 mg 1.25 mg 2.5 mg    Anticoagulation Dose Instructions as of 10/03/2015      Total [name]   New Dose 15 mg 2.5 mg 1.25 mg 2.5 mg 2.5 mg 2.5 mg 1.25 mg 2.5 mg     (2.5 mg x 1)  (2.5 mg x 0.5)  (2.5 mg x 1)  (2.5 mg x 1)  (2.5 mg x 1)  (2.5 mg x 0.5)  (2.5 mg x 1)                           INR today: Therapeutic  PLAN Weekly dose was unchanged   Patient Instructions  Dose response handout given  Patient advised to contact clinic or seek medical attention if signs/symptoms of bleeding or thromboembolism occur.  Patient verbalized understanding by repeating back information and was advised to contact me if further medication-related questions arise. Patient was also provided an information handout.  Follow-up 10/21/2015  Judieth Keens, PharmD Clinical Pharmacy Resident  15 minutes spent face-to-face with the patient during the encounter.

## 2015-10-04 MED ORDER — WARFARIN SODIUM 2.5 MG PO TABS: 2.5000 mg | ORAL_TABLET | Freq: Every day | ORAL | Status: DC

## 2015-10-04 NOTE — Discharge Summary (Signed)
Name: [name] MRN: [id number] DOB: 01-Dec-1945 70 y.o. PCP: Ryan Ser, DO  Date of Admission: 09/26/2015  8:15 AM Date of Discharge: 10/04/2015 Attending Physician: No att. providers found  Discharge Diagnosis: 1. COPD Exacerbation  Active Problems:   Atrial fibrillation (HCC)   Hypertension   Tobacco abuse   Polycythemia vera (HCC)   Diastolic CHF, acute on chronic (HCC)   History of heroin abuse   Acute on chronic congestive heart failure (HCC)   Chronic systolic heart failure (HCC)   COPD exacerbation (HCC)   Elevated lactic acid level  Discharge Medications:   Medication List    TAKE these medications        albuterol 108 (90 Base) MCG/ACT inhaler  Commonly known as:  PROAIR HFA  Inhale 1-2 puffs into the lungs every 6 (six) hours as needed for wheezing or shortness of breath. PLACE ON HOLD UNTIL PATIENT REQUESTS TO FILL     amiodarone 200 MG tablet  Commonly known as:  PACERONE  Take 1 tablet (200 mg total) by mouth daily.     beclomethasone 80 MCG/ACT inhaler  Commonly known as:  QVAR  Inhale 1 puff into the lungs 2 (two) times daily. PLACE ON HOLD UNTIL PATIENT REQUESTS TO FILL     carvedilol 12.5 MG tablet  Commonly known as:  COREG  Take 12.5 mg by mouth daily.     folic acid 1 MG tablet  Commonly known as:  FOLVITE  Take 1 tablet (1 mg total) by mouth daily.     furosemide 40 MG tablet  Commonly known as:  LASIX  Take 1 tablet (40 mg total) by mouth daily.     hydroxyurea 500 MG capsule  Commonly known as:  HYDREA  Take 2 capsules (1,000 mg total) by mouth daily. May take with food to minimize GI side effects.     multivitamin with minerals Tabs tablet  Take 1 tablet by mouth daily.     predniSONE 50 MG tablet  Commonly known as:  DELTASONE  Take one tablet by mouth once per day for 4 days total     SUBOXONE 8-2 MG Film  Generic drug:  Buprenorphine HCl-Naloxone HCl  Take 1 Film by mouth daily.     tiotropium 18 MCG  inhalation capsule  Commonly known as:  SPIRIVA HANDIHALER  Place 1 capsule (18 mcg total) into inhaler and inhale daily. PLACE ON HOLD UNTIL PATIENT REQUESTS TO FILL     warfarin 2.5 MG tablet  Commonly known as:  COUMADIN  Take 1 tablet (2.5 mg total) by mouth daily. Except skip dose on Tuesdays. PHARMACIST NOTE: DOSE AND TABLET STRENGTH CHANGE. PLEASE VOID PREVIOUS RX ON FILE FOR WARFARIN.        Disposition and follow-up:   RyanRyan Hamilton was discharged from [facility] in Good condition.  At the hospital follow up visit please address:  1.  Respiratory symptoms  2.  Labs / imaging needed at time of follow-up: None  3.  Pending labs/ test needing follow-up: None  Follow-up Appointments:   Discharge Instructions:     Discharge Instructions    Diet - low sodium heart healthy    Complete by:  As directed      Increase activity slowly    Complete by:  As directed            Consultations:    Procedures Performed:  Dg Chest 2 View  09/27/2015  CLINICAL DATA:  Patient with shortness breath and cough. EXAM: CHEST  2 VIEW COMPARISON:  Chest radiograph 09/26/2015. FINDINGS: Monitoring leads overlie the patient. Stable enlarged cardiac and mediastinal contours. No consolidative pulmonary opacities. No pleural effusion or pneumothorax. Thoracic spine degenerative changes. IMPRESSION: No acute cardiopulmonary process. Electronically Signed   By: Lovey Newcomer M.D.   On: 09/27/2015 09:41   Dg Chest Port 1 View  09/27/2015  CLINICAL DATA:  Dyspnea EXAM: PORTABLE CHEST 1 VIEW COMPARISON:  09/27/2015 FINDINGS: Cardiac enlargement.  Cardiac loop recorder unchanged. Increase and vascular congestion since the prior study suggesting fluid overload with mild interstitial edema. Small right pleural effusion. IMPRESSION: Interval development of vascular congestion and mild edema since earlier today. Electronically Signed   By: Franchot Gallo M.D.   On: 09/27/2015  19:52   Dg Chest Portable 1 View  09/26/2015  CLINICAL DATA:  Shortness of Breath EXAM: PORTABLE CHEST 1 VIEW COMPARISON:  12/04/2014 FINDINGS: Cardiomediastinal silhouette is stable. Mild perihilar bilateral interstitial prominence left greater than right. Mild basilar atelectasis. Findings suspicious for mild interstitial edema. Interstitial pneumonitis cannot be excluded. IMPRESSION: Mild perihilar bilateral interstitial prominence left greater than right. Findings suspicious for interstitial edema or bilateral pneumonitis. Mild basilar atelectasis. Electronically Signed   By: Lahoma Crocker M.D.   On: 09/26/2015 08:46    2D Echo: None  Cardiac Cath: None  Admission HPI:  [name] is a 70 year old man with past medical history of polycythemia vera, hypertension, GOLD Stage 3 COPD, PAF on coumadin, chronic grade 1 diastolic CHF, history of heroin abuse, and tobacco use who presents with shortness of breath.   He reports being in his normal state of health until about 1 week ago when he began having nasal congestion, rhinorrhea, productive cough, wheezing, and shortness of breath which worsened this morning. He denies fever, chills, or chest tightness/pain. His grandchildren have been sick recently. He has GOLD Stage 3 COPD (last PFTs in 2015 with FEV 1 47%) and is compliant with taking spiriva and pulmicort. He is not on home oxygen. He has been using his albuterol inhaler 1-2 times a day for the past few days but normally does not use it. His last exacerbation was in April of 2016 and has history of intubation in 2015. He smokes 2-3 cigarettes daily (50 year history) and is trying to cut back.   He has chronic diastolic CHF with last echo on 06/07/14 with grade 1 diastolic dysfunction. He reports running out of his lasix (40 mg daily) for the past week or so. He denies orthopnea, PND, or LE edema.   He denies alcohol or drug use. He has history of heroine abuse and reports taking  suboxone daily. He reports having received flu shot from his pharmacy this year and has had PCV13 and PSV23 vaccinations.   On EMS arrival he was found to be hypoxic to 88% on CPAP. He was placed on Bipap with ABG revealing pH 7.2 CO2 55. He was also given breathing treatments in route to ED. Chest xray revealed mild interstitial edema with no infiltrate or consolidation. His symptoms improved significantly with bipap and now placed on 2L oxygen via [place]. He reports feeling much better. Lactic acid level was initially elevated at 7.17 which then normalized without IVF's. He was started on ceftriaxone and azithromycin for CAP in the ED.   Hospital Course by problem list: Active Problems:   Atrial fibrillation ([place])   Hypertension   Tobacco abuse   Polycythemia vera ([place])  Diastolic CHF, acute on chronic (HCC)   History of heroin abuse   Acute on chronic congestive heart failure (HCC)   Chronic systolic heart failure (HCC)   COPD exacerbation (HCC)   Elevated lactic acid level   1. COPD Exacerbation - patient initially required BiPAP but with steroids and breathing treatments, his breathing improved significantly. He was also placed on doxycycline for increased sputum production. After feeling back at his baseline, comfortable on room air, he was ambulated without any issue. He met all criteria for discharge and was sent home with a continued course of steroids, doxycycline, and plans to consider pulmonary rehab and repeating PFTs outpatient.  Discharge Vitals:   BP 129/61 mmHg  Pulse 68  Temp(Src) 97.8 F (36.6 C) (Oral)  Resp 14  Ht '5\' 6"'$  (1.676 m)  Wt 216 lb 0.8 oz (98 kg)  BMI 34.89 kg/m2  SpO2 93%  Discharge Labs:  No results found for this or any previous visit (from the past 24 hour(s)).  Signed: Norval Gable, MD 10/04/2015, 7:02 AM    Services Ordered on Discharge: None Equipment Ordered on Discharge: None

## 2015-10-11 ENCOUNTER — Other Ambulatory Visit: Payer: Self-pay | Admitting: Internal Medicine

## 2015-10-21 ENCOUNTER — Ambulatory Visit (INDEPENDENT_AMBULATORY_CARE_PROVIDER_SITE_OTHER): Payer: Medicare Other | Admitting: Pharmacist

## 2015-10-21 ENCOUNTER — Other Ambulatory Visit: Payer: Self-pay | Admitting: Pharmacist

## 2015-10-21 DIAGNOSIS — D45 Polycythemia vera: Secondary | ICD-10-CM

## 2015-10-21 DIAGNOSIS — Z7901 Long term (current) use of anticoagulants: Secondary | ICD-10-CM

## 2015-10-21 DIAGNOSIS — I4891 Unspecified atrial fibrillation: Secondary | ICD-10-CM | POA: Diagnosis present

## 2015-10-21 LAB — POCT INR: INR: 3.8

## 2015-10-21 NOTE — Patient Instructions (Signed)
Patient educated about medication as defined in this encounter and verbalized understanding by repeating back instructions provided.   

## 2015-10-21 NOTE — Progress Notes (Signed)
Anticoagulation Management [name] is a 70 y.o. male who reports to the clinic for monitoring of warfarin treatment.    Indication: atrial fibrillation Duration: indefinite  Anticoagulation Clinic Visit History: Patient does not report signs/symptoms of bleeding or thromboembolism, patient admits to drinking alcohol over the weekend.   Anticoagulation Episode Summary    Current INR goal 2.0-3.0  Next INR check 11/04/2015  INR from last check 3.8! (10/21/2015)  Weekly max dose   Target end date Indefinite  [facility]  Preferred lab   Send INR reminders to    Indications  Atrial fibrillation ([place]) [I48.91]        Comments        ASSESSMENT Recent Results: The most recent result is correlated with 15 mg per week: Lab Results  Component Value Date   INR 3.8 10/21/2015   INR 2.1 10/03/2015   INR 2.21* 09/29/2015   Anticoagulation Dosing: INR as of 10/21/2015 and Previous Dosing Information    INR Dt INR Goal [name] [name]   10/21/2015 3.8 2.0-3.0 15 mg 2.5 mg 1.25 mg 2.5 mg 2.5 mg 2.5 mg 1.25 mg 2.5 mg    Anticoagulation Dose Instructions as of 10/21/2015      Total [name]   New Dose 13.75 mg 2.5 mg 2.5 mg 1.25 mg 2.5 mg 1.25 mg 2.5 mg 1.25 mg     (2.5 mg x 1)  (2.5 mg x 1)  (2.5 mg x 0.5)  (2.5 mg x 1)  (2.5 mg x 0.5)  (2.5 mg x 1)  (2.5 mg x 0.5)                           INR today: Supratherapeutic  PLAN Weekly dose was decreased by 8% to 13.75 mg per week  Patient Instructions  Patient educated about medication as defined in this encounter and verbalized understanding by repeating back instructions provided.    Patient advised to contact clinic or seek medical attention if signs/symptoms of bleeding or thromboembolism occur.  Patient verbalized understanding by repeating back information and was advised to contact me if further medication-related questions arise. Patient was  also provided an information handout.  Follow-up No Follow-up on file.  [name]  15 minutes spent face-to-face with the patient during the encounter. 50% of time spent on education. 50% of time was spent on assessment and plan.

## 2015-10-21 NOTE — Progress Notes (Signed)
INTERNAL MEDICINE TEACHING ATTENDING ADDENDUM - Mattisyn Cardona M.D  Duration- indefinite, Indication- atrial fibrillation, INR- supratherapeutic. Agree with pharmacy recommendations as outlined in their note.    

## 2015-10-22 MED ORDER — HYDROXYUREA 500 MG PO CAPS: ORAL_CAPSULE | ORAL | Status: DC

## 2015-10-22 MED ORDER — HYDROXYUREA 500 MG PO CAPS: 500.0000 mg | ORAL_CAPSULE | Freq: Every day | ORAL | Status: DC

## 2015-10-22 NOTE — Telephone Encounter (Signed)
Updated Rx sig

## 2015-10-22 NOTE — Addendum Note (Signed)
Addended by: Forde Dandy on: 10/22/2015 08:48 AM   Modules accepted: Orders

## 2015-10-23 ENCOUNTER — Other Ambulatory Visit: Payer: Self-pay | Admitting: *Deleted

## 2015-10-23 DIAGNOSIS — D45 Polycythemia vera: Secondary | ICD-10-CM

## 2015-10-23 MED ORDER — HYDROXYUREA 500 MG PO CAPS: ORAL_CAPSULE | ORAL | Status: DC

## 2015-10-24 ENCOUNTER — Ambulatory Visit (INDEPENDENT_AMBULATORY_CARE_PROVIDER_SITE_OTHER): Payer: Medicare Other | Admitting: *Deleted

## 2015-10-24 DIAGNOSIS — R55 Syncope and collapse: Secondary | ICD-10-CM

## 2015-10-24 NOTE — Progress Notes (Signed)
Carelink Summary Report / Loop Recorder 

## 2015-11-04 ENCOUNTER — Ambulatory Visit (INDEPENDENT_AMBULATORY_CARE_PROVIDER_SITE_OTHER): Payer: Medicare Other | Admitting: Pharmacist

## 2015-11-04 DIAGNOSIS — I4891 Unspecified atrial fibrillation: Secondary | ICD-10-CM | POA: Diagnosis not present

## 2015-11-04 DIAGNOSIS — Z7901 Long term (current) use of anticoagulants: Secondary | ICD-10-CM

## 2015-11-04 LAB — CUP PACEART REMOTE DEVICE CHECK: MDC IDC SESS DTM: 20170205073528

## 2015-11-04 LAB — POCT INR: INR: 2

## 2015-11-04 NOTE — Patient Instructions (Signed)
Patient instructed to take medications as defined in the Anti-coagulation Track section of this encounter.  Patient instructed to take today's dose.  Patient verbalized understanding of these instructions.    

## 2015-11-04 NOTE — Progress Notes (Signed)
[facility] summary report received. Battery status OK. Normal device function. No new symptom episodes, tachy episodes, brady, or pause episodes. 6 AF- 0.5%, no ECGs +amio +warfarin. Monthly summary reports and ROV/PRN

## 2015-11-04 NOTE — Progress Notes (Signed)
Anti-Coagulation Progress Note  [name] is a 70 y.o. male who is currently on an anti-coagulation regimen.    RECENT RESULTS: Recent results are below, the most recent result is correlated with a dose of 13.75 mg. per week: Lab Results  Component Value Date   INR 2.00 11/04/2015   INR 3.8 10/21/2015   INR 2.1 10/03/2015    ANTI-COAG DOSE: Anticoagulation Dose Instructions as of 11/04/2015      [name] Tue Wed Thu Fri Sat   New Dose 2.5 mg 2.5 mg 2.5 mg 2.5 mg 1.25 mg 2.5 mg 1.25 mg       ANTICOAG SUMMARY: Anticoagulation Episode Summary    Current INR goal 2.0-3.0  Next INR check 11/25/2015  INR from last check 2.00 (11/04/2015)  Weekly max dose   Target end date Indefinite  [facility]  Preferred lab   Send INR reminders to    Indications  Atrial fibrillation ([place]) [I48.91]        Comments         ANTICOAG TODAY: Anticoagulation Summary as of 11/04/2015    INR goal 2.0-3.0  Selected INR 2.00 (11/04/2015)  Next INR check 11/25/2015  Target end date Indefinite   Indications  Atrial fibrillation ([place]) [I48.91]      Anticoagulation Episode Summary    [facility]   Preferred lab    Send INR reminders to    Comments       PATIENT INSTRUCTIONS: Patient Instructions  Patient instructed to take medications as defined in the Anti-coagulation Track section of this encounter.  Patient instructed to take today's dose.  Patient verbalized understanding of these instructions.       FOLLOW-UP Return in 3 weeks (on 11/25/2015) for Follow up INR at 1000h.  [name], III Pharm.D., CACP

## 2015-11-04 NOTE — Progress Notes (Signed)
Indication: Atrial fibrillation. Duration: Indefinite. INR: At target. Agree with Dr. Groce's assessment and plan. 

## 2015-11-25 ENCOUNTER — Telehealth: Payer: Self-pay | Admitting: Hematology and Oncology

## 2015-11-25 ENCOUNTER — Other Ambulatory Visit (HOSPITAL_BASED_OUTPATIENT_CLINIC_OR_DEPARTMENT_OTHER): Payer: Medicare Other

## 2015-11-25 ENCOUNTER — Ambulatory Visit (INDEPENDENT_AMBULATORY_CARE_PROVIDER_SITE_OTHER): Payer: Medicare Other | Admitting: Pharmacist

## 2015-11-25 ENCOUNTER — Encounter: Payer: Self-pay | Admitting: Hematology and Oncology

## 2015-11-25 ENCOUNTER — Ambulatory Visit (HOSPITAL_BASED_OUTPATIENT_CLINIC_OR_DEPARTMENT_OTHER): Payer: Medicare Other | Admitting: Hematology and Oncology

## 2015-11-25 ENCOUNTER — Ambulatory Visit (INDEPENDENT_AMBULATORY_CARE_PROVIDER_SITE_OTHER): Payer: Medicare Other | Admitting: *Deleted

## 2015-11-25 VITALS — BP 128/61 | HR 63 | Temp 98.5°F | Resp 17 | Ht 66.0 in | Wt 216.2 lb

## 2015-11-25 DIAGNOSIS — D45 Polycythemia vera: Secondary | ICD-10-CM | POA: Diagnosis present

## 2015-11-25 DIAGNOSIS — R55 Syncope and collapse: Secondary | ICD-10-CM

## 2015-11-25 DIAGNOSIS — I4891 Unspecified atrial fibrillation: Secondary | ICD-10-CM

## 2015-11-25 DIAGNOSIS — Z7901 Long term (current) use of anticoagulants: Secondary | ICD-10-CM | POA: Diagnosis not present

## 2015-11-25 DIAGNOSIS — Z720 Tobacco use: Secondary | ICD-10-CM | POA: Diagnosis not present

## 2015-11-25 DIAGNOSIS — D471 Chronic myeloproliferative disease: Secondary | ICD-10-CM | POA: Diagnosis not present

## 2015-11-25 LAB — CBC WITH DIFFERENTIAL/PLATELET
BASO%: 0.7 % (ref 0.0–2.0)
Basophils Absolute: 0 10*3/uL (ref 0.0–0.1)
EOS ABS: 0.3 10*3/uL (ref 0.0–0.5)
EOS%: 4.6 % (ref 0.0–7.0)
HCT: 44.4 % (ref 38.4–49.9)
HEMOGLOBIN: 15 g/dL (ref 13.0–17.1)
LYMPH%: 23.3 % (ref 14.0–49.0)
MCH: 39.7 pg — ABNORMAL HIGH (ref 27.2–33.4)
MCHC: 33.8 g/dL (ref 32.0–36.0)
MCV: 117.6 fL — AB (ref 79.3–98.0)
MONO#: 0.6 10*3/uL (ref 0.1–0.9)
MONO%: 10.2 % (ref 0.0–14.0)
NEUT%: 61.2 % (ref 39.0–75.0)
NEUTROS ABS: 3.6 10*3/uL (ref 1.5–6.5)
PLATELETS: 148 10*3/uL (ref 140–400)
RBC: 3.77 10*6/uL — AB (ref 4.20–5.82)
RDW: 14.2 % (ref 11.0–14.6)
WBC: 6 10*3/uL (ref 4.0–10.3)
lymph#: 1.4 10*3/uL (ref 0.9–3.3)

## 2015-11-25 LAB — POCT INR: INR: 1.9

## 2015-11-25 NOTE — Progress Notes (Signed)
[facility] OFFICE PROGRESS NOTE  Patient Care Team: Gwynn Burly, DO as PCP - General Artis Delay, MD as Consulting Physician (Hematology and Oncology)  SUMMARY OF ONCOLOGIC HISTORY:  This is a pleasant gentleman who is being referred here because of high hemoglobin level. In November 2014, we removed one unit of blood and order an additional workup to rule out myeloproliferative disorder. Blood test result confirmed low erythropoietin level along with detectable JAK 2 mutation, confirmed myeloproliferative disorder Unfortunately, bone marrow biopsy on 06/26/2013 was nondiagnostic On 07/31/13 the patient was started on 1 hydroxyurea a day On 08/15/2013, increased hydroxyurea to 2 tablets a day and remove one more unit of blood due to the high hemoglobin On 09/05/2013, I increased hydroxyurea to 3 tablets a day and remove one more unit of blood due to high hemoglobin and hematocrit On 01/23/2014, dose of hydroxyurea is reduced to 2 tablets a day due to anemia. On 04/23/2015, dose of hydroxyurea is adjusted to 500 mg on Mondays, Wednesdays and Fridays and to take 1000 mg for the rest of the week  INTERVAL HISTORY: Please see below for problem oriented charting. He feels well. Denies recent infection. The patient denies any recent signs or symptoms of bleeding such as spontaneous epistaxis, hematuria or hematochezia. He is compliant taking his medication as instructed.  REVIEW OF SYSTEMS:   Constitutional: Denies fevers, chills or abnormal weight loss Eyes: Denies blurriness of vision Ears, nose, mouth, throat, and face: Denies mucositis or sore throat Respiratory: Denies cough, dyspnea or wheezes Cardiovascular: Denies palpitation, chest discomfort or lower extremity swelling Gastrointestinal:  Denies nausea, heartburn or change in bowel habits Skin: Denies abnormal skin rashes Lymphatics: Denies new lymphadenopathy or easy bruising Neurological:Denies numbness,  tingling or new weaknesses Behavioral/Psych: Mood is stable, no new changes  All other systems were reviewed with the patient and are negative.  I have reviewed the past medical history, past surgical history, social history and family history with the patient and they are unchanged from previous note.  ALLERGIES:  is allergic to codeine.  MEDICATIONS:  Current Outpatient Prescriptions  Medication Sig Dispense Refill  . albuterol (PROAIR HFA) 108 (90 Base) MCG/ACT inhaler Inhale 1-2 puffs into the lungs every 6 (six) hours as needed for wheezing or shortness of breath. PLACE ON HOLD UNTIL PATIENT REQUESTS TO FILL 18 g 3  . amiodarone (PACERONE) 200 MG tablet Take 1 tablet (200 mg total) by mouth daily. 30 tablet 6  . beclomethasone (QVAR) 80 MCG/ACT inhaler Inhale 1 puff into the lungs 2 (two) times daily. PLACE ON HOLD UNTIL PATIENT REQUESTS TO FILL 1 Inhaler 11  . carvedilol (COREG) 12.5 MG tablet Take 12.5 mg by mouth daily.     . folic acid (FOLVITE) 1 MG tablet Take 1 tablet (1 mg total) by mouth daily. 90 tablet 3  . furosemide (LASIX) 40 MG tablet Take 1 tablet (40 mg total) by mouth daily. 90 tablet 3  . hydroxyurea (HYDREA) 500 MG capsule Take 1 capsule on M/W/F, and take 2 capsules all other days 45 capsule 3  . Multiple Vitamin (MULTIVITAMIN WITH MINERALS) TABS tablet Take 1 tablet by mouth daily. 60 tablet 2  . predniSONE (DELTASONE) 50 MG tablet Take one tablet by mouth once per day for 4 days total 4 tablet 0  . SUBOXONE 8-2 MG FILM Take 1 Film by mouth daily.     [name] tiotropium (SPIRIVA HANDIHALER) 18 MCG inhalation capsule Place 1 capsule (18 mcg total) into inhaler  and inhale daily. PLACE ON HOLD UNTIL PATIENT REQUESTS TO FILL 30 capsule 11  . warfarin (COUMADIN) 2.5 MG tablet Take 1 tablet (2.5 mg total) by mouth daily. Except take 1/2 tablet (1.'25mg'$ ) on Tuesday and Friday. 30 tablet 3   No current facility-administered medications for this visit.    PHYSICAL  EXAMINATION: ECOG PERFORMANCE STATUS: 0 - Asymptomatic  Filed Vitals:   11/25/15 0819  BP: 128/61  Pulse: 63  Temp: 98.5 F (36.9 C)  Resp: 17   Filed Weights   11/25/15 0819  Weight: 216 lb 3.2 oz (98.068 kg)    GENERAL:alert, no distress and comfortable. He is mildly obese SKIN: skin color, texture, turgor are normal, no rashes or significant lesions EYES: normal, Conjunctiva are pink and non-injected, sclera clear OROPHARYNX:no exudate, no erythema and lips, buccal mucosa, and tongue normal. He has very poor dentition. Musculoskeletal:no cyanosis of digits and no clubbing  NEURO: alert & oriented x 3 with fluent speech, no focal motor/sensory deficits  LABORATORY DATA:  I have reviewed the data as listed    Component Value Date/Time   NA 140 09/27/2015 2020   NA 143 08/01/2015 1351   NA 143 06/12/2013 1125   K 4.5 09/27/2015 2020   K 3.7 06/12/2013 1125   CL 101 09/27/2015 2020   CO2 26 09/27/2015 2020   CO2 32* 06/12/2013 1125   GLUCOSE 182* 09/27/2015 2020   GLUCOSE 110* 08/01/2015 1351   GLUCOSE 92 06/12/2013 1125   BUN 27* 09/27/2015 2020   BUN 22 08/01/2015 1351   BUN 14.6 06/12/2013 1125   CREATININE 1.44* 09/27/2015 2020   CREATININE 1.05 08/30/2014 1628   CREATININE 0.9 06/12/2013 1125   CALCIUM 8.5* 09/27/2015 2020   CALCIUM 8.9 06/12/2013 1125   PROT 6.6 09/27/2015 0320   PROT 6.9 06/12/2013 1125   ALBUMIN 2.9* 09/27/2015 0320   ALBUMIN 3.3* 06/12/2013 1125   AST 29 09/27/2015 0320   AST 21 06/12/2013 1125   ALT 23 09/27/2015 0320   ALT 28 06/12/2013 1125   ALKPHOS 87 09/27/2015 0320   ALKPHOS 112 06/12/2013 1125   BILITOT 0.6 09/27/2015 0320   BILITOT 0.82 06/12/2013 1125   GFRNONAA 48* 09/27/2015 2020   GFRNONAA 73 08/30/2014 1628   GFRAA 56* 09/27/2015 2020   GFRAA 84 08/30/2014 1628    No results found for: SPEP, UPEP  Lab Results  Component Value Date   WBC 6.0 11/25/2015   NEUTROABS 3.6 11/25/2015   HGB 15.0 11/25/2015   HCT  44.4 11/25/2015   MCV 117.6* 11/25/2015   PLT 148 11/25/2015      Chemistry      Component Value Date/Time   NA 140 09/27/2015 2020   NA 143 08/01/2015 1351   NA 143 06/12/2013 1125   K 4.5 09/27/2015 2020   K 3.7 06/12/2013 1125   CL 101 09/27/2015 2020   CO2 26 09/27/2015 2020   CO2 32* 06/12/2013 1125   BUN 27* 09/27/2015 2020   BUN 22 08/01/2015 1351   BUN 14.6 06/12/2013 1125   CREATININE 1.44* 09/27/2015 2020   CREATININE 1.05 08/30/2014 1628   CREATININE 0.9 06/12/2013 1125      Component Value Date/Time   CALCIUM 8.5* 09/27/2015 2020   CALCIUM 8.9 06/12/2013 1125   ALKPHOS 87 09/27/2015 0320   ALKPHOS 112 06/12/2013 1125   AST 29 09/27/2015 0320   AST 21 06/12/2013 1125   ALT 23 09/27/2015 0320   ALT 28 06/12/2013 1125  BILITOT 0.6 09/27/2015 0320   BILITOT 0.82 06/12/2013 1125      ASSESSMENT & PLAN:  Polycythemia vera ([place]) The patient has JAK2 mutation positive myeloproliferative disorder. He tolerated the hydroxyurea well.  He is tolerating hydroxyurea dose at 500 mg on Mondays, Wednesdays and Fridays and to take 1000 mg for the rest of the week and he labs are back to within normal limits I recommend we continue the same and I will see him back in 6 months with repeat history, physical examination and blood work   Chronic anticoagulation The patient denies any recent signs or symptoms of bleeding such as spontaneous epistaxis, hematuria or hematochezia. he will continue current medical management. I recommend close follow-up with primary care doctor for medication adjustment.  Tobacco abuse I spent some time counseling the patient the importance of tobacco cessation. He is currently attempting to quit on his own      No orders of the defined types were placed in this encounter.   All questions were answered. The patient knows to call the clinic with any problems, questions or concerns. No barriers to learning was detected. I spent 15 minutes  counseling the patient face to face. The total time spent in the appointment was 20 minutes and more than 50% was on counseling and review of test results     [facility], [place], MD 11/25/2015 10:28 AM

## 2015-11-25 NOTE — Telephone Encounter (Signed)
Gave and pritned appt sched and avs for pt for NOV °

## 2015-11-25 NOTE — Assessment & Plan Note (Signed)
I spent some time counseling the patient the importance of tobacco cessation. He is currently attempting to quit on his own.  

## 2015-11-25 NOTE — Patient Instructions (Signed)
Patient instructed to take medications as defined in the Anti-coagulation Track section of this encounter.  Patient instructed to take today's dose.  Patient verbalized understanding of these instructions.    

## 2015-11-25 NOTE — Progress Notes (Signed)
Anti-Coagulation Progress Note  [name] is a 70 y.o. male who is currently on an anti-coagulation regimen.    RECENT RESULTS: Recent results are below, the most recent result is correlated with a dose of 15 mg. per week: Lab Results  Component Value Date   INR 1.90 11/25/2015   INR 2.00 11/04/2015   INR 3.8 10/21/2015    ANTI-COAG DOSE: Anticoagulation Dose Instructions as of 11/25/2015      [name] Tue Wed Thu Fri Sat   New Dose 2.5 mg 2.5 mg 2.5 mg 2.5 mg 2.5 mg 2.5 mg 1.25 mg       ANTICOAG SUMMARY: Anticoagulation Episode Summary    Current INR goal 2.0-3.0  Next INR check 12/09/2015  INR from last check 1.90! (11/25/2015)  Weekly max dose   Target end date Indefinite  [facility]  Preferred lab   Send INR reminders to    Indications  Atrial fibrillation ([place]) [I48.91]        Comments         ANTICOAG TODAY: Anticoagulation Summary as of 11/25/2015    INR goal 2.0-3.0  Selected INR 1.90! (11/25/2015)  Next INR check 12/09/2015  Target end date Indefinite   Indications  Atrial fibrillation ([place]) [I48.91]      Anticoagulation Episode Summary    [facility]   Preferred lab    Send INR reminders to    Comments       PATIENT INSTRUCTIONS: Patient Instructions  Patient instructed to take medications as defined in the Anti-coagulation Track section of this encounter.  Patient instructed to take today's dose.  Patient verbalized understanding of these instructions.       FOLLOW-UP Return in about 2 weeks (around 12/09/2015) for Follow up INR at 0915h.  [name], III Pharm.D., CACP

## 2015-11-25 NOTE — Assessment & Plan Note (Signed)
The patient denies any recent signs or symptoms of bleeding such as spontaneous epistaxis, hematuria or hematochezia. he will continue current medical management. I recommend close follow-up with primary care doctor for medication adjustment. 

## 2015-11-25 NOTE — Assessment & Plan Note (Signed)
The patient has JAK2 mutation positive myeloproliferative disorder. He tolerated the hydroxyurea well.  He is tolerating hydroxyurea dose at 500 mg on Mondays, Wednesdays and Fridays and to take 1000 mg for the rest of the week and he labs are back to within normal limits I recommend we continue the same and I will see him back in 6 months with repeat history, physical examination and blood work

## 2015-11-25 NOTE — Progress Notes (Signed)
INTERNAL MEDICINE TEACHING ATTENDING ADDENDUM - Linnea Todisco M.D  Duration- indefinite, Indication- afib, INR- sub therapeutic. Agree with pharmacy recommendations as outlined in their note.      

## 2015-11-26 NOTE — Progress Notes (Signed)
Carelink Summary Report / Loop Recorder 

## 2015-12-05 ENCOUNTER — Telehealth: Payer: Self-pay | Admitting: *Deleted

## 2015-12-05 NOTE — Telephone Encounter (Signed)
Pt scheduled for Oral Surgery with Dr. Diona Browner, DMD on 6/9.  He asking for Clearance from Dr. Alvy Bimler from Hematology standpoint. Letter can be faxed to their office at fax [phone].  Their phone number is [phone].

## 2015-12-06 ENCOUNTER — Encounter: Payer: Self-pay | Admitting: Hematology and Oncology

## 2015-12-06 ENCOUNTER — Telehealth: Payer: Self-pay | Admitting: *Deleted

## 2015-12-06 NOTE — Telephone Encounter (Signed)
Letter faxed to Dr Lupita Leash office

## 2015-12-06 NOTE — Telephone Encounter (Signed)
I left the letter on your desk

## 2015-12-09 ENCOUNTER — Ambulatory Visit (INDEPENDENT_AMBULATORY_CARE_PROVIDER_SITE_OTHER): Payer: Medicare Other | Admitting: Pharmacist

## 2015-12-09 ENCOUNTER — Other Ambulatory Visit: Payer: Self-pay

## 2015-12-09 DIAGNOSIS — Z7901 Long term (current) use of anticoagulants: Secondary | ICD-10-CM

## 2015-12-09 DIAGNOSIS — I4891 Unspecified atrial fibrillation: Secondary | ICD-10-CM

## 2015-12-09 LAB — POCT INR: INR: 3.1

## 2015-12-09 MED ORDER — AMIODARONE HCL 200 MG PO TABS: 200.0000 mg | ORAL_TABLET | Freq: Every day | ORAL | Status: DC

## 2015-12-09 NOTE — Progress Notes (Signed)
Anti-Coagulation Progress Note  [name] is a 70 y.o. male who is currently on an anti-coagulation regimen.    RECENT RESULTS: Recent results are below, the most recent result is correlated with a dose of 16.25 mg. per week: Lab Results  Component Value Date   INR 3.10 12/09/2015   INR 1.90 11/25/2015   INR 2.00 11/04/2015    ANTI-COAG DOSE: Anticoagulation Dose Instructions as of 12/09/2015      [name] Tue Wed Thu Fri Sat   New Dose 2.5 mg 1.25 mg 2.5 mg 2.5 mg 2.5 mg 2.5 mg 1.25 mg       ANTICOAG SUMMARY: Anticoagulation Episode Summary    Current INR goal 2.0-3.0  Next INR check 12/30/2015  INR from last check 3.10! (12/09/2015)  Weekly max dose   Target end date Indefinite  [facility] check location [facility]  Preferred lab   Send INR reminders to    Indications  Atrial fibrillation ([place]) [I48.91]        Comments         ANTICOAG TODAY: Anticoagulation Summary as of 12/09/2015    INR goal 2.0-3.0  Selected INR 3.10! (12/09/2015)  Next INR check 12/30/2015  Target end date Indefinite   Indications  Atrial fibrillation ([place]) [I48.91]      Anticoagulation Episode Summary    [facility]R check location [facility]   Preferred lab    Send INR reminders to    Comments       PATIENT INSTRUCTIONS: Patient Instructions  Patient instructed to take medications as defined in the Anti-coagulation Track section of this encounter.  Patient instructed to take today's dose.  Patient verbalized understanding of these instructions.       FOLLOW-UP Return in 3 weeks (on 12/30/2015) for Follow up INR at 0900h.  [name], III Pharm.D., CACP

## 2015-12-09 NOTE — Patient Instructions (Signed)
Patient instructed to take medications as defined in the Anti-coagulation Track section of this encounter.  Patient instructed to take today's dose.  Patient verbalized understanding of these instructions.    

## 2015-12-09 NOTE — Progress Notes (Signed)
INTERNAL MEDICINE TEACHING ATTENDING ADDENDUM - Novalynn Branaman M.D  Duration- indefinite, Indication- afib, INR- supratherapeutic. Agree with pharmacy recommendations as outlined in their note.     

## 2015-12-12 ENCOUNTER — Encounter: Payer: Self-pay | Admitting: Pharmacist

## 2015-12-14 LAB — CUP PACEART REMOTE DEVICE CHECK: Date Time Interrogation Session: 20170406080531

## 2015-12-14 NOTE — Progress Notes (Signed)
Carelink summary report received. Battery status OK. Normal device function. No new symptom episodes, tachy episodes, brady, or pause episodes. 5.2% AF burden, +Warfarin and amiodarone.  Monthly summary reports and ROV/PRN

## 2015-12-23 ENCOUNTER — Ambulatory Visit (INDEPENDENT_AMBULATORY_CARE_PROVIDER_SITE_OTHER): Payer: Medicare Other | Admitting: *Deleted

## 2015-12-23 DIAGNOSIS — R55 Syncope and collapse: Secondary | ICD-10-CM | POA: Diagnosis not present

## 2015-12-23 NOTE — Progress Notes (Signed)
Carelink Summary Report / Loop Recorder 

## 2016-01-02 LAB — CUP PACEART REMOTE DEVICE CHECK: MDC IDC SESS DTM: 20170506083539

## 2016-01-06 ENCOUNTER — Ambulatory Visit (INDEPENDENT_AMBULATORY_CARE_PROVIDER_SITE_OTHER): Payer: Medicare Other | Admitting: Pharmacist

## 2016-01-06 DIAGNOSIS — Z7901 Long term (current) use of anticoagulants: Secondary | ICD-10-CM | POA: Diagnosis not present

## 2016-01-06 DIAGNOSIS — I4891 Unspecified atrial fibrillation: Secondary | ICD-10-CM | POA: Diagnosis present

## 2016-01-06 LAB — POCT INR: INR: 2.4

## 2016-01-06 NOTE — Progress Notes (Signed)
Anti-Coagulation Progress Note  [name] is a 70 y.o. male who is currently on an anti-coagulation regimen.    RECENT RESULTS: Recent results are below, the most recent result is correlated with a dose of 15 mg. per week: Lab Results  Component Value Date   INR 2.40 01/06/2016   INR 3.10 12/09/2015   INR 1.90 11/25/2015    ANTI-COAG DOSE: Anticoagulation Dose Instructions as of 01/06/2016      [name] Tue Wed Thu Fri Sat   New Dose 2.5 mg 1.25 mg 2.5 mg 2.5 mg 2.5 mg 2.5 mg 1.25 mg       ANTICOAG SUMMARY: Anticoagulation Episode Summary    Current INR goal 2.0-3.0  Next INR check 02/03/2016  INR from last check 2.40 (01/06/2016)  Weekly max dose   Target end date Indefinite  [facility]  Preferred lab   Send INR reminders to    Indications  Atrial fibrillation ([place]) [I48.91]        Comments         ANTICOAG TODAY: Anticoagulation Summary as of 01/06/2016    INR goal 2.0-3.0  Selected INR 2.40 (01/06/2016)  Next INR check 02/03/2016  Target end date Indefinite   Indications  Atrial fibrillation ([place]) [I48.91]      Anticoagulation Episode Summary    [facility] check [facility]   Preferred lab    Send INR reminders to    Comments       PATIENT INSTRUCTIONS: Patient Instructions  Patient instructed to take medications as defined in the Anti-coagulation Track section of this encounter.  Patient instructed to take today's dose.  Patient verbalized understanding of these instructions.       FOLLOW-UP Return in 4 weeks (on 02/03/2016) for Follow up INR at 1045h.  [name], III Pharm.D., CACP

## 2016-01-06 NOTE — Patient Instructions (Signed)
Patient instructed to take medications as defined in the Anti-coagulation Track section of this encounter.  Patient instructed to take today's dose.  Patient verbalized understanding of these instructions.    

## 2016-01-22 ENCOUNTER — Ambulatory Visit (INDEPENDENT_AMBULATORY_CARE_PROVIDER_SITE_OTHER): Payer: Medicare Other | Admitting: *Deleted

## 2016-01-22 DIAGNOSIS — R55 Syncope and collapse: Secondary | ICD-10-CM | POA: Diagnosis not present

## 2016-01-22 NOTE — H&P (Signed)
HISTORY AND PHYSICAL  [name] is a 70 y.o. male patient with CC: painful teeth. Referred by general dentist for full dental extractions.  No diagnosis found.  Past Medical History  Diagnosis Date  . Cardiomyopathy     EF55% 11/14<<35%   . Substance abuse     alcohol  . Hypertension   . Atrial fibrillation /flutter     hx of flutter ablation 2/12  . Shortness of breath     "all the time lately" (12/14/2012)  . GERD (gastroesophageal reflux disease)   . Hepatitis     "think I had that once; a long time ago; from dirty needles I think" (12/14/2012)  . Primary polycythemia ([place]) 06/12/2013  . Leukocytosis, unspecified 06/12/2013  . 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 Obesity   . Syncope   . Cancer ([place])   . CHF (congestive heart failure) ([place])   . Renal insufficiency   . COPD (chronic obstructive pulmonary disease) ([facility])     No current facility-administered medications for this encounter.   Current Outpatient Prescriptions  Medication Sig Dispense Refill  . albuterol (PROAIR HFA) 108 (90 Base) MCG/ACT inhaler Inhale 1-2 puffs into the lungs every 6 (six) hours as needed for wheezing or shortness of breath. PLACE ON HOLD UNTIL PATIENT REQUESTS TO FILL 18 g 3  . amiodarone (PACERONE) 200 MG tablet Take 1 tablet (200 mg total) by mouth daily. 30 tablet 6  . beclomethasone (QVAR) 80 MCG/ACT inhaler Inhale 1 puff into the lungs 2 (two) times daily. PLACE ON HOLD UNTIL PATIENT REQUESTS TO FILL 1 Inhaler 11  . carvedilol (COREG) 12.5 MG tablet Take 12.5 mg by mouth daily.     . folic acid (FOLVITE) 1 MG tablet Take 1 tablet (1 mg total) by mouth daily. 90 tablet 3  . furosemide (LASIX) 40 MG tablet Take 1 tablet (40 mg total) by mouth daily. 90 tablet 3  . hydroxyurea (HYDREA) 500 MG capsule Take 1 capsule on M/W/F, and take 2 capsules all other days 45 capsule 3  . Multiple Vitamin (MULTIVITAMIN WITH MINERALS) TABS tablet Take 1 tablet by mouth daily. 60  tablet 2  . predniSONE (DELTASONE) 50 MG tablet Take one tablet by mouth once per day for 4 days total 4 tablet 0  . SUBOXONE 8-2 MG FILM Take 1 Film by mouth daily.     [name] tiotropium (SPIRIVA HANDIHALER) 18 MCG inhalation capsule Place 1 capsule (18 mcg total) into inhaler and inhale daily. PLACE ON HOLD UNTIL PATIENT REQUESTS TO FILL 30 capsule 11  . warfarin (COUMADIN) 2.5 MG tablet Take 1 tablet (2.5 mg total) by mouth daily. Except take 1/2 tablet (1.25mg ) on Tuesday and Friday. 30 tablet 3   Allergies  Allergen Reactions  . Codeine Rash    Pt says ok   Active Problems:   * No active hospital problems. *  Vitals: There were no vitals taken for this visit. Lab results:No results found for this or any previous visit (from the past 21 hour(s)). Radiology Results: No results found. General appearance: alert, cooperative, no distress and morbidly obese Head: Normocephalic, without obvious abnormality, atraumatic Eyes: negative Nose: Nares normal. Septum midline. Mucosa normal. No drainage or sinus tenderness. Throat: Edentulous maxilla. Severe dental caries mandibular teth # 20-29, 31,32. bilateral mandibular lingual tori. Pharynx clear. Neck: no adenopathy, supple, symmetrical, trachea midline and thyroid not enlarged, symmetric, no tenderness/mass/nodules Resp: clear to auscultation bilaterally Cardio: regular rate and rhythm, S1, S2 normal, no murmur, click,  rub or gallop  Assessment: Nonrestorable teeth, Bilateral lingual tori.  Plan: full dental extractions with alveoloplasty, removal mandibular lingual tori. GA. Day surgery.   Akeel Reffner M 01/22/2016

## 2016-01-22 NOTE — Progress Notes (Signed)
Carelink Summary Report / Loop Recorder 

## 2016-01-23 ENCOUNTER — Encounter (HOSPITAL_BASED_OUTPATIENT_CLINIC_OR_DEPARTMENT_OTHER): Payer: Self-pay | Admitting: *Deleted

## 2016-01-23 NOTE — Progress Notes (Signed)
Anesthesia Chart Review: SAME DAY WORK-UP.  Patient is a 70 year old male scheduled for multiple teeth extractions with alveoloplasty on 01/24/16 by Dr. Hoyt Koch.  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 Admission 09/27/15-09/29/15 for acute hypercapnic respiratory failure in the setting of COPD exacerbation. He required BiPap short-term, antibiotics, and steroids. Troponin 0.09 to 0.03.   PCP is Dr. Jule Ser with [facility]. Hematologist is Dr. Alvy Bimler. [name], PharmD wrote on 12/12/15 (see Letters tab) that patient could discontinue warfarin four days prior to his surgery, but he states that no one told him when to stop so his last dose was 01/22/16. Sam at Dr. Lupita Leash office notified that patient reported last dose of warfarin was on 01/22/16. EP Cardiologist is Dr. Lovena Le, last visit seen is from 12/13/14. He saw Dr. Wynonia Lawman back in 2014    EP Cardiology notation from Chanetta Marshall, NP on 12/14/15, "Carelink summary report received. Battery status OK. Normal device function. No new symptom episodes, tachy episodes, brady, or pause episodes. 5.2% AF burden, +Warfarin and amiodarone. Monthly summary reports and ROV/PRN.  Current (updating pending) medication list includes albuterol, amiodarone, Qvar, Coreg, folic acid, Lasix, Hydrea, Suboxone, Spiriva, warfarin.   09/27/15 EKG (during admission for COPD exacerbation): ST at 114, right atrial enlargement, borderline ST depression in lateral leads, non-specific ST/T wave abnormality. Artifact/baseline wandered, particularly in limb leads.    06/07/14 Echo: Study Conclusions - Left ventricle: The cavity size was normal. Wall thickness was normal. Systolic function was normal. The estimated ejection fraction was in the range of 60% to 65%. Wall motion was normal; there were no regional wall motion abnormalities. Doppler parameters are consistent with abnormal left ventricularrelaxation (grade 1 diastolic dysfunction). The E/e&' ratio isbetween 8-15, suggesting indeterminate LV filling pressure. - Aortic valve: Trileaflet. There was no stenosis. There was moderate central regurgitation. - Aorta: Aortic root dimension: 40 mm (ED). Ascending aortic diameter: 46 mm (S). - Aortic root: The aortic root is dilated. - Ascending aorta: The ascending is dilated. - Mitral valve: Mildly thickened leaflets . There was mild regurgitation. - Left atrium: The atrium was mildly dilated (39 ml/m2). - Right atrium: Moderately dilated. - Tricuspid valve: There was mild regurgitation. - Pulmonary arteries: PA peak pressure: 41 mm Hg (S). Impressions: - LVEF 60-65%, normal wall thickness, normal wall motion, diastolic dysfunction, indeterminate LV filling pressure. Dilated aorta - root at 4.0 cm and ascending aorta to 4.6 cm. Moderate central AI, mild MR, mild TR, RVSP 41 mmHg. Compared to the prior echo in10/2015, the EF has normalized. There is more moderate AI.  06/05/14 Chest CTA: IMPRESSION: - No evidence of pulmonary embolism. 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 - 18 x 15 mm RIGHT thyroid nodule again noted; followup nonemergent thyroid ultrasound recommended to characterize. - Bronchitic changes with mild bibasilar atelectasis.  09/27/15 1948 1V CXR:  IMPRESSION: Interval development of vascular congestion and mild edema since earlier today. 09/27/15 0938 2V CXR: IMPRESSION: No acute cardiopulmonary process.  05/30/14 PFTs: FVC 2.27 (58%), FEV1 1.31 (45%), DLCOunc 16.84 (62%).  He will need updated  labs.  Above reviewed with anesthesiologist Dr. Lissa Hoard. Patient is a same day work-up, so further  evaluation on the day of surgery to ensure no acute cardiopulmonary issues prior to proceeding. In addition, he is at high risk for having an elevated PT/INR tomorrow since warfarin was just held starting yesterday. Will defer to Dr. Hoyt Koch if he wants to postpone surgery or make decision based on INR results tomorrow. As above, Sam to address with Dr. Hoyt Koch.  George Hugh [facility] Stay [facility]/Anesthesiology Phone [phone] 01/23/2016 3:06 PM

## 2016-01-24 ENCOUNTER — Observation Stay (HOSPITAL_BASED_OUTPATIENT_CLINIC_OR_DEPARTMENT_OTHER)
Admission: RE | Admit: 2016-01-24 | Discharge: 2016-01-25 | Disposition: A | Discharge status: Home / Self Care | Payer: Medicare Other | Source: Ambulatory Visit | Attending: Oral Surgery | Admitting: Oral Surgery

## 2016-01-24 ENCOUNTER — Encounter (HOSPITAL_COMMUNITY): Payer: Self-pay | Admitting: *Deleted

## 2016-01-24 ENCOUNTER — Ambulatory Visit (HOSPITAL_COMMUNITY): Payer: Medicare Other | Admitting: Vascular Surgery

## 2016-01-24 ENCOUNTER — Encounter (HOSPITAL_COMMUNITY)
Admission: RE | Disposition: A | Discharge status: Home / Self Care | Payer: Self-pay | Source: Ambulatory Visit | Attending: Oral Surgery

## 2016-01-24 DIAGNOSIS — I4891 Unspecified atrial fibrillation: Secondary | ICD-10-CM | POA: Diagnosis not present

## 2016-01-24 DIAGNOSIS — I429 Cardiomyopathy, unspecified: Secondary | ICD-10-CM | POA: Insufficient documentation

## 2016-01-24 DIAGNOSIS — I5040 Unspecified combined systolic (congestive) and diastolic (congestive) heart failure: Secondary | ICD-10-CM | POA: Insufficient documentation

## 2016-01-24 DIAGNOSIS — K0889 Other specified disorders of teeth and supporting structures: Secondary | ICD-10-CM | POA: Insufficient documentation

## 2016-01-24 DIAGNOSIS — I083 Combined rheumatic disorders of mitral, aortic and tricuspid valves: Secondary | ICD-10-CM | POA: Diagnosis not present

## 2016-01-24 DIAGNOSIS — I110 Hypertensive heart disease with heart failure: Secondary | ICD-10-CM | POA: Diagnosis not present

## 2016-01-24 DIAGNOSIS — Z885 Allergy status to narcotic agent status: Secondary | ICD-10-CM | POA: Diagnosis not present

## 2016-01-24 DIAGNOSIS — I4581 Long QT syndrome: Secondary | ICD-10-CM | POA: Insufficient documentation

## 2016-01-24 DIAGNOSIS — B1920 Unspecified viral hepatitis C without hepatic coma: Secondary | ICD-10-CM | POA: Insufficient documentation

## 2016-01-24 DIAGNOSIS — E669 Obesity, unspecified: Secondary | ICD-10-CM | POA: Diagnosis not present

## 2016-01-24 DIAGNOSIS — M270 Developmental disorders of jaws: Secondary | ICD-10-CM | POA: Insufficient documentation

## 2016-01-24 DIAGNOSIS — E041 Nontoxic single thyroid nodule: Secondary | ICD-10-CM | POA: Diagnosis not present

## 2016-01-24 DIAGNOSIS — K219 Gastro-esophageal reflux disease without esophagitis: Secondary | ICD-10-CM | POA: Diagnosis not present

## 2016-01-24 DIAGNOSIS — K029 Dental caries, unspecified: Secondary | ICD-10-CM | POA: Diagnosis present

## 2016-01-24 DIAGNOSIS — J449 Chronic obstructive pulmonary disease, unspecified: Secondary | ICD-10-CM | POA: Diagnosis not present

## 2016-01-24 DIAGNOSIS — Z6834 Body mass index (BMI) 34.0-34.9, adult: Secondary | ICD-10-CM | POA: Diagnosis not present

## 2016-01-24 DIAGNOSIS — Z7901 Long term (current) use of anticoagulants: Secondary | ICD-10-CM | POA: Diagnosis not present

## 2016-01-24 DIAGNOSIS — Z9889 Other specified postprocedural states: Secondary | ICD-10-CM

## 2016-01-24 HISTORY — PX: MULTIPLE EXTRACTIONS WITH ALVEOLOPLASTY: SHX5342

## 2016-01-24 HISTORY — PX: MULTIPLE TOOTH EXTRACTIONS: SHX2053

## 2016-01-24 HISTORY — DX: Pneumonia, unspecified organism: J18.9

## 2016-01-24 LAB — COMPREHENSIVE METABOLIC PANEL
ALK PHOS: 85 U/L (ref 38–126)
ALT: 19 U/L (ref 17–63)
ANION GAP: 7 (ref 5–15)
AST: 23 U/L (ref 15–41)
Albumin: 3.2 g/dL — ABNORMAL LOW (ref 3.5–5.0)
BUN: 21 mg/dL — ABNORMAL HIGH (ref 6–20)
CALCIUM: 9 mg/dL (ref 8.9–10.3)
CO2: 27 mmol/L (ref 22–32)
CREATININE: 1.19 mg/dL (ref 0.61–1.24)
Chloride: 106 mmol/L (ref 101–111)
Glucose, Bld: 102 mg/dL — ABNORMAL HIGH (ref 65–99)
Potassium: 3.7 mmol/L (ref 3.5–5.1)
Sodium: 140 mmol/L (ref 135–145)
Total Bilirubin: 0.8 mg/dL (ref 0.3–1.2)
Total Protein: 6.9 g/dL (ref 6.5–8.1)

## 2016-01-24 LAB — CBC
HCT: 43.2 % (ref 39.0–52.0)
HEMOGLOBIN: 14.8 g/dL (ref 13.0–17.0)
MCH: 40.4 pg — ABNORMAL HIGH (ref 26.0–34.0)
MCHC: 34.3 g/dL (ref 30.0–36.0)
MCV: 118 fL — ABNORMAL HIGH (ref 78.0–100.0)
PLATELETS: 159 10*3/uL (ref 150–400)
RBC: 3.66 MIL/uL — AB (ref 4.22–5.81)
RDW: 13.2 % (ref 11.5–15.5)
WBC: 5.3 10*3/uL (ref 4.0–10.5)

## 2016-01-24 LAB — PROTIME-INR
INR: 1.8 — AB (ref 0.00–1.49)
PROTHROMBIN TIME: 20.9 s — AB (ref 11.6–15.2)

## 2016-01-24 SURGERY — MULTIPLE EXTRACTION WITH ALVEOLOPLASTY
Anesthesia: General | Site: Mouth | Laterality: Bilateral

## 2016-01-24 MED ORDER — LACTATED RINGERS IV SOLN
INTRAVENOUS | Status: DC
Start: 2016-01-24 — End: 2016-01-24
  Administered 2016-01-24 (×3): via INTRAVENOUS

## 2016-01-24 MED ORDER — OXYCODONE HCL 5 MG PO TABS: 5.0000 mg | ORAL_TABLET | ORAL | Status: DC | PRN

## 2016-01-24 MED ORDER — ACETAMINOPHEN 500 MG PO TABS: 1000.0000 mg | ORAL_TABLET | Freq: Four times a day (QID) | ORAL | Status: DC

## 2016-01-24 MED ORDER — SUGAMMADEX SODIUM 200 MG/2ML IV SOLN
INTRAVENOUS | Status: AC
  Filled 2016-01-24: qty 4

## 2016-01-24 MED ORDER — EPHEDRINE SULFATE-NACL 50-0.9 MG/10ML-% IV SOSY
PREFILLED_SYRINGE | INTRAVENOUS | Status: DC | PRN
  Administered 2016-01-24: 10 mg via INTRAVENOUS

## 2016-01-24 MED ORDER — BUDESONIDE 0.25 MG/2ML IN SUSP
0.2500 mg | Freq: Two times a day (BID) | RESPIRATORY_TRACT | Status: DC
  Administered 2016-01-24 – 2016-01-25 (×2): 0.25 mg via RESPIRATORY_TRACT
  Filled 2016-01-24 (×2): qty 2

## 2016-01-24 MED ORDER — LIDOCAINE-EPINEPHRINE 2 %-1:100000 IJ SOLN
INTRAMUSCULAR | Status: DC | PRN
  Administered 2016-01-24: 16 mL via INTRADERMAL

## 2016-01-24 MED ORDER — ONDANSETRON HCL 4 MG/2ML IJ SOLN
INTRAMUSCULAR | Status: AC
  Filled 2016-01-24: qty 4

## 2016-01-24 MED ORDER — PROPOFOL 10 MG/ML IV BOLUS
INTRAVENOUS | Status: DC | PRN
  Administered 2016-01-24: 200 mg via INTRAVENOUS

## 2016-01-24 MED ORDER — FENTANYL CITRATE (PF) 100 MCG/2ML IJ SOLN: 25.0000 ug | INTRAMUSCULAR | Status: DC | PRN

## 2016-01-24 MED ORDER — FUROSEMIDE 40 MG PO TABS: 40.0000 mg | ORAL_TABLET | Freq: Every day | ORAL | Status: DC

## 2016-01-24 MED ORDER — FENTANYL CITRATE (PF) 250 MCG/5ML IJ SOLN
INTRAMUSCULAR | Status: AC
  Filled 2016-01-24: qty 5

## 2016-01-24 MED ORDER — SODIUM CHLORIDE 0.45 % IV SOLN
INTRAVENOUS | Status: DC
  Administered 2016-01-24 – 2016-01-25 (×2): via INTRAVENOUS

## 2016-01-24 MED ORDER — ROCURONIUM BROMIDE 100 MG/10ML IV SOLN
INTRAVENOUS | Status: DC | PRN
  Administered 2016-01-24: 40 mg via INTRAVENOUS

## 2016-01-24 MED ORDER — HYDROMORPHONE HCL 1 MG/ML IJ SOLN
1.0000 mg | INTRAMUSCULAR | Status: DC | PRN
  Administered 2016-01-24: 1 mg via INTRAVENOUS
  Filled 2016-01-24: qty 1

## 2016-01-24 MED ORDER — ROCURONIUM BROMIDE 50 MG/5ML IV SOLN
INTRAVENOUS | Status: AC
  Filled 2016-01-24: qty 1

## 2016-01-24 MED ORDER — IBUPROFEN 600 MG PO TABS: 600.0000 mg | ORAL_TABLET | Freq: Four times a day (QID) | ORAL | Status: DC | PRN

## 2016-01-24 MED ORDER — HYDROXYUREA 500 MG PO CAPS: 500.0000 mg | ORAL_CAPSULE | Freq: Every day | ORAL | Status: DC

## 2016-01-24 MED ORDER — 0.9 % SODIUM CHLORIDE (POUR BTL) OPTIME
TOPICAL | Status: DC | PRN
  Administered 2016-01-24: 1000 mL

## 2016-01-24 MED ORDER — AMIODARONE HCL 200 MG PO TABS
200.0000 mg | ORAL_TABLET | Freq: Every day | ORAL | Status: DC
Start: 2016-01-24 — End: 2016-01-25

## 2016-01-24 MED ORDER — LIDOCAINE-EPINEPHRINE 2 %-1:100000 IJ SOLN
INTRAMUSCULAR | Status: AC
  Filled 2016-01-24: qty 1

## 2016-01-24 MED ORDER — FENTANYL CITRATE (PF) 100 MCG/2ML IJ SOLN
INTRAMUSCULAR | Status: DC | PRN
  Administered 2016-01-24 (×3): 50 ug via INTRAVENOUS
  Administered 2016-01-24: 100 ug via INTRAVENOUS

## 2016-01-24 MED ORDER — PROPOFOL 10 MG/ML IV BOLUS
INTRAVENOUS | Status: AC
  Filled 2016-01-24: qty 20

## 2016-01-24 MED ORDER — ALBUTEROL SULFATE (2.5 MG/3ML) 0.083% IN NEBU: 2.5000 mg | INHALATION_SOLUTION | Freq: Four times a day (QID) | RESPIRATORY_TRACT | Status: DC | PRN

## 2016-01-24 MED ORDER — KETOROLAC TROMETHAMINE 15 MG/ML IJ SOLN
15.0000 mg | Freq: Four times a day (QID) | INTRAMUSCULAR | Status: DC
  Administered 2016-01-24 – 2016-01-25 (×4): 15 mg via INTRAVENOUS
  Filled 2016-01-24 (×4): qty 1

## 2016-01-24 MED ORDER — SUGAMMADEX SODIUM 200 MG/2ML IV SOLN
INTRAVENOUS | Status: DC | PRN
  Administered 2016-01-24: 200 mg via INTRAVENOUS

## 2016-01-24 MED ORDER — ONDANSETRON 4 MG PO TBDP: 4.0000 mg | ORAL_TABLET | Freq: Four times a day (QID) | ORAL | Status: DC | PRN

## 2016-01-24 MED ORDER — OXYMETAZOLINE HCL 0.05 % NA SOLN
NASAL | Status: AC
  Filled 2016-01-24: qty 15

## 2016-01-24 MED ORDER — LACTATED RINGERS IV SOLN: INTRAVENOUS | Status: DC

## 2016-01-24 MED ORDER — LIDOCAINE HCL (CARDIAC) 20 MG/ML IV SOLN
INTRAVENOUS | Status: DC | PRN
  Administered 2016-01-24: 60 mg via INTRAVENOUS

## 2016-01-24 MED ORDER — HYDROXYUREA 500 MG PO CAPS: 1000.0000 mg | ORAL_CAPSULE | ORAL | Status: DC

## 2016-01-24 MED ORDER — HYDROXYUREA 500 MG PO CAPS: 500.0000 mg | ORAL_CAPSULE | ORAL | Status: DC

## 2016-01-24 MED ORDER — LIDOCAINE 2% (20 MG/ML) 5 ML SYRINGE
INTRAMUSCULAR | Status: AC
  Filled 2016-01-24: qty 10

## 2016-01-24 MED ORDER — ZOLPIDEM TARTRATE 5 MG PO TABS: 5.0000 mg | ORAL_TABLET | Freq: Every evening | ORAL | Status: DC | PRN

## 2016-01-24 MED ORDER — ONDANSETRON HCL 4 MG/2ML IJ SOLN
4.0000 mg | Freq: Four times a day (QID) | INTRAMUSCULAR | Status: DC | PRN
Start: 2016-01-24 — End: 2016-01-25

## 2016-01-24 MED ORDER — ONDANSETRON HCL 4 MG/2ML IJ SOLN
INTRAMUSCULAR | Status: DC | PRN
  Administered 2016-01-24: 4 mg via INTRAVENOUS

## 2016-01-24 MED ORDER — TIOTROPIUM BROMIDE MONOHYDRATE 18 MCG IN CAPS
18.0000 ug | ORAL_CAPSULE | Freq: Every day | RESPIRATORY_TRACT | Status: DC
  Administered 2016-01-25: 18 ug via RESPIRATORY_TRACT
  Filled 2016-01-24: qty 5

## 2016-01-24 MED ORDER — CARVEDILOL 12.5 MG PO TABS: 12.5000 mg | ORAL_TABLET | Freq: Every day | ORAL | Status: DC

## 2016-01-24 SURGICAL SUPPLY — 31 items
BUR CROSS CUT FISSURE 1.6 (BURR) ×2 IMPLANT
BUR CROSS CUT FISSURE 1.6MM (BURR) ×1
BUR EGG ELITE 4.0 (BURR) IMPLANT
BUR EGG ELITE 4.0MM (BURR)
CANISTER SUCTION 2500CC (MISCELLANEOUS) ×3 IMPLANT
COVER SURGICAL LIGHT HANDLE (MISCELLANEOUS) ×3 IMPLANT
CRADLE DONUT ADULT HEAD (MISCELLANEOUS) ×3 IMPLANT
DECANTER SPIKE VIAL GLASS SM (MISCELLANEOUS) IMPLANT
DRAPE U-SHAPE 76X120 STRL (DRAPES) ×3 IMPLANT
FLUID NSS /IRRIG 1000 ML XXX (MISCELLANEOUS) ×3 IMPLANT
GAUZE PACKING FOLDED 2  STR (GAUZE/BANDAGES/DRESSINGS) ×2
GAUZE PACKING FOLDED 2 STR (GAUZE/BANDAGES/DRESSINGS) ×1 IMPLANT
GLOVE BIO SURGEON STRL SZ 6.5 (GLOVE) ×2 IMPLANT
GLOVE BIO SURGEON STRL SZ7.5 (GLOVE) ×3 IMPLANT
GLOVE BIO SURGEONS STRL SZ 6.5 (GLOVE) ×1
GLOVE BIOGEL PI IND STRL 7.0 (GLOVE) ×1 IMPLANT
GLOVE BIOGEL PI INDICATOR 7.0 (GLOVE) ×2
GOWN STRL REUS W/ TWL LRG LVL3 (GOWN DISPOSABLE) ×1 IMPLANT
GOWN STRL REUS W/ TWL XL LVL3 (GOWN DISPOSABLE) ×1 IMPLANT
GOWN STRL REUS W/TWL LRG LVL3 (GOWN DISPOSABLE) ×2
GOWN STRL REUS W/TWL XL LVL3 (GOWN DISPOSABLE) ×2
KIT BASIN OR (CUSTOM PROCEDURE TRAY) ×3 IMPLANT
KIT ROOM TURNOVER OR (KITS) ×3 IMPLANT
NEEDLE 22X1 1/2 (OR ONLY) (NEEDLE) ×6 IMPLANT
NS IRRIG 1000ML POUR BTL (IV SOLUTION) ×3 IMPLANT
PAD ARMBOARD 7.5X6 YLW CONV (MISCELLANEOUS) ×3 IMPLANT
SUT CHROMIC 3 0 PS 2 (SUTURE) ×6 IMPLANT
SYR CONTROL 10ML LL (SYRINGE) ×3 IMPLANT
TRAY ENT MC OR (CUSTOM PROCEDURE TRAY) ×3 IMPLANT
TUBING IRRIGATION (MISCELLANEOUS) ×3 IMPLANT
YANKAUER SUCT BULB TIP NO VENT (SUCTIONS) ×3 IMPLANT

## 2016-01-24 NOTE — Anesthesia Procedure Notes (Signed)
Procedure Name: Intubation Date/Time: 01/24/2016 11:13 AM Performed by: Shirlyn Goltz Pre-anesthesia Checklist: Patient identified, Emergency Drugs available, Suction available and Patient being monitored Patient Re-evaluated:Patient Re-evaluated prior to inductionOxygen Delivery Method: Circle system utilized Preoxygenation: Pre-oxygenation with 100% oxygen Intubation Type: IV induction Ventilation: Mask ventilation without difficulty and Oral airway inserted - appropriate to patient size Laryngoscope Size: Glidescope and 4 Grade View: Grade I Nasal Tubes: Right and Nasal prep performed Tube size: 7.5 mm Number of attempts: 1 Airway Equipment and Method: Video-laryngoscopy Placement Confirmation: ETT inserted through vocal cords under direct vision,  positive ETCO2 and breath sounds checked- equal and bilateral Tube secured with: Tape Dental Injury: Teeth and Oropharynx as per pre-operative assessment

## 2016-01-24 NOTE — Anesthesia Preprocedure Evaluation (Addendum)
Anesthesia Evaluation  Patient identified by MRN, date of birth, ID band Patient awake    Reviewed: Allergy & Precautions, H&P , NPO status , Patient's Chart, lab work & pertinent test results, reviewed documented beta blocker date and time   Airway Mallampati: II  TM Distance: >3 FB Neck ROM: full    Dental  (+) Dental Advisory Given, Poor Dentition   Pulmonary shortness of breath and with exertion, COPD, Current Smoker,    Pulmonary exam normal breath sounds clear to auscultation       Cardiovascular hypertension, Pt. on home beta blockers +CHF  Normal cardiovascular exam+ dysrhythmias Atrial Fibrillation  Rhythm:regular Rate:Normal  Alcoholic cardiomyopathy.  Systolic and Diastolic CHF with good EF and grade 1 diastolic dysfunction. Prolonged QT, AF   Neuro/Psych negative neurological ROS  negative psych ROS   GI/Hepatic negative GI ROS, (+) Hepatitis -, C  Endo/Other  negative endocrine ROS  Renal/GU negative Renal ROS  negative genitourinary   Musculoskeletal   Abdominal   Peds  Hematology negative hematology ROS (+)   Anesthesia Other Findings   Reproductive/Obstetrics negative OB ROS                           Anesthesia Physical Anesthesia Plan  ASA: III  Anesthesia Plan: General   Post-op Pain Management:    Induction: Intravenous  Airway Management Planned: Nasal ETT  Additional Equipment:   Intra-op Plan:   Post-operative Plan: Extubation in OR  Informed Consent: I have reviewed the patients History and Physical, chart, labs and discussed the procedure including the risks, benefits and alternatives for the proposed anesthesia with the patient or authorized representative who has indicated his/her understanding and acceptance.   Dental Advisory Given  Plan Discussed with: CRNA  Anesthesia Plan Comments:         Anesthesia Quick Evaluation

## 2016-01-24 NOTE — Op Note (Signed)
01/24/2016  11:50 AM  PATIENT:  [name]  70 y.o. male  PRE-OPERATIVE DIAGNOSIS:  NON RESTORABLE TEETH # 20, 21, 22, 23, 24, 25, 26, 27, 28, 29, 31, 32, BILATERAL MANDIBULAR LINGUAL TORI  POST-OPERATIVE DIAGNOSIS:  SAME  PROCEDURE:  Procedure(s): MULTIPLE EXTRACTION TEETH # 20, 21, 22, 23, 24, 25, 26, 27, 28, 29, 31, 32, WITH ALVEOLOPLASTY;  REMOVAL BILATERAL MANDIBULAR LINGUAL TORI  SURGEON:  Surgeon(s): Diona Browner, DDS  ANESTHESIA:   local and general  EBL:  minimal  DRAINS: none   SPECIMEN:  No Specimen  COUNTS:  YES  PLAN OF CARE: Discharge to home after PACU  PATIENT DISPOSITION:  PACU - hemodynamically stable.   PROCEDURE DETAILS: Dictation [id number]  Gae Bon, DMD 01/24/2016 11:50 AM

## 2016-01-24 NOTE — Anesthesia Postprocedure Evaluation (Signed)
Anesthesia Post Note  Patient: [name]  Procedure(s) Performed: Procedure(s) (LRB): MULTIPLE EXTRACTION WITH ALVEOLOPLASTY BILATERAL (Bilateral)  Patient location during evaluation: PACU Anesthesia Type: General Level of consciousness: awake and alert Pain management: pain level controlled Vital Signs Assessment: post-procedure vital signs reviewed and stable Respiratory status: spontaneous breathing, nonlabored ventilation, respiratory function stable and patient connected to nasal cannula oxygen Cardiovascular status: blood pressure returned to baseline and stable Postop Assessment: no signs of nausea or vomiting Anesthetic complications: no    Last Vitals:  Filed Vitals:   01/24/16 1255 01/24/16 1300  BP: 138/66   Pulse: 56   Temp:  36.9 C  Resp: 11     Last Pain:  Filed Vitals:   01/24/16 1315  PainSc: Asleep                 Zaia Carre L

## 2016-01-24 NOTE — Transfer of Care (Signed)
Immediate Anesthesia Transfer of Care Note  Patient: [name]  Procedure(s) Performed: Procedure(s): MULTIPLE EXTRACTION WITH ALVEOLOPLASTY BILATERAL (Bilateral)  Patient Location: PACU  Anesthesia Type:General  Level of Consciousness: awake, alert , oriented and patient cooperative  Airway & Oxygen Therapy: Patient Spontanous Breathing and Patient connected to nasal cannula oxygen  Post-op Assessment: Report given to RN and Post -op Vital signs reviewed and stable  Post vital signs: Reviewed and stable  Last Vitals:  Filed Vitals:   01/24/16 0754 01/24/16 1208  BP: 125/66 146/74  Pulse: 56 63  Temp: 37 C 36.4 C  Resp: 20 9    Last Pain:  Filed Vitals:   01/24/16 1211  PainSc: Asleep         Complications: No apparent anesthesia complications

## 2016-01-24 NOTE — H&P (Signed)
H&P documentation  -History and Physical Reviewed  -Patient has been re-examined  -No change in the plan of care  [name]  

## 2016-01-25 DIAGNOSIS — K029 Dental caries, unspecified: Secondary | ICD-10-CM | POA: Diagnosis not present

## 2016-01-25 MED ORDER — OXYCODONE-ACETAMINOPHEN 5-325 MG PO TABS: 1.0000 | ORAL_TABLET | ORAL | Status: DC | PRN

## 2016-01-25 NOTE — Discharge Summary (Signed)
Patient ID: [name] MRN: [id number] DOB/AGE: [birth year] 70 y.o.  Admit date: 01/24/2016 Discharge date: 01/25/2016  Admission Diagnoses: Nonrestorable teeth, post-op  Discharge Diagnoses:  Active Problems:   Post-operative state   Discharged Condition: good  Hospital Course: Admitted for post-surgical observation. IV fluids, pain management.  Consults: None  Significant Diagnostic Studies: none  Treatments: IV hydration, Pain mangement  Discharge Exam: Blood pressure 126/62, pulse 63, temperature 97.8 F (36.6 C), temperature source Axillary, resp. rate 17, height 5\' 6"  (1.676 m), weight 97.523 kg (215 lb), SpO2 92 %. General appearance: alert, cooperative and no distress Head: Normocephalic, without obvious abnormality, atraumatic Throat: hemostatic. minimal edema. pharynx clear.  Neck: no adenopathy, supple, symmetrical, trachea midline and thyroid not enlarged, symmetric, no tenderness/mass/nodules Resp: clear to auscultation bilaterally Cardio: regular rate and rhythm, S1, S2 normal, no murmur, click, rub or gallop  Disposition: 01-Home or Self Care  Discharge Instructions    Call MD for:  difficulty breathing, headache or visual disturbances    Complete by:  As directed      Call MD for:  persistant nausea and vomiting    Complete by:  As directed      Call MD for:  severe uncontrolled pain    Complete by:  As directed      Call MD for:  temperature >100.4    Complete by:  As directed      Diet - low sodium heart healthy    Complete by:  As directed   Soft Diet. Advance as tolerated.     Discharge instructions    Complete by:  As directed   Warm salt water mouth rinses 4-5 times per day starting the day after surgery. Ice to affected area for 2-3 days. Soft diet, advance as tolerated. No smoking for 2 weeks. Follow-up visit with Dr. Hoyt Koch as scheduled. Call [phone] for problems.     Increase activity slowly    Complete by:  As directed             Medication List    TAKE these medications        albuterol 108 (90 Base) MCG/ACT inhaler  Commonly known as:  PROAIR HFA  Inhale 1-2 puffs into the lungs every 6 (six) hours as needed for wheezing or shortness of breath. PLACE ON HOLD UNTIL PATIENT REQUESTS TO FILL     amiodarone 200 MG tablet  Commonly known as:  PACERONE  Take 1 tablet (200 mg total) by mouth daily.     carvedilol 12.5 MG tablet  Commonly known as:  COREG  Take 12.5 mg by mouth every morning.     fluticasone 44 MCG/ACT inhaler  Commonly known as:  FLOVENT HFA  Inhale 2 puffs into the lungs 2 (two) times daily.     folic acid 1 MG tablet  Commonly known as:  FOLVITE  Take 1 tablet (1 mg total) by mouth daily.     furosemide 40 MG tablet  Commonly known as:  LASIX  Take 1 tablet (40 mg total) by mouth daily.     hydroxyurea 500 MG capsule  Commonly known as:  HYDREA  Take 1 capsule on M/W/F, and take 2 capsules all other days     oxyCODONE-acetaminophen 5-325 MG tablet  Commonly known as:  PERCOCET  Take 1-2 tablets by mouth every 4 (four) hours as needed for severe pain.     SUBOXONE 8-2 MG Film  Generic drug:  Buprenorphine HCl-Naloxone HCl  Place 1  Film under the tongue daily.     tiotropium 18 MCG inhalation capsule  Commonly known as:  SPIRIVA HANDIHALER  Place 1 capsule (18 mcg total) into inhaler and inhale daily. PLACE ON HOLD UNTIL PATIENT REQUESTS TO FILL     warfarin 2.5 MG tablet  Commonly known as:  COUMADIN  Take 1 tablet (2.5 mg total) by mouth daily. Except take 1/2 tablet (1.25mg ) on Tuesday and Friday.         Signed: Gae Bon 01/25/2016, 8:30 AM

## 2016-01-25 NOTE — Progress Notes (Signed)
Pt ready for discharge, ate breakfast and tolerated it well.  Pt states he will take his home meds once at home and that he feels OK to drive.  DC instructions reviewed and copy given.  Rx given for percocet and explained.

## 2016-01-25 NOTE — Op Note (Signed)
NAME:  [name], [name]    ACCOUNT NO.:  [id number]  MEDICAL RECORD NO.:  [id number]  LOCATION:  6N05C                        FACILITY:  [facility]  PHYSICIAN:  Gae Bon, M.D.  DATE OF BIRTH:  1945-09-21  DATE OF PROCEDURE:  01/24/2016 DATE OF DISCHARGE:                              OPERATIVE REPORT   PREOPERATIVE DIAGNOSIS:  Nonrestorable teeth #20, 21, 22, 23, 24, 25, 26, 27, 28, 29, 31, 32, bilateral mandibular lingual tori.  POSTOPERATIVE DIAGNOSIS:  Nonrestorable teeth #20, 21, 22, 23, 24, 25, 26, 27, 28, 29, 31, 32, bilateral mandibular lingual tori.  PROCEDURE: 1. Extraction of teeth #20, 21, 22, 23, 24, 25, 26, 27, 28, 29, 31,     32. 2. Alveoplasty, right and left mandible. 3. Removal of bilateral mandibular lingual tori.  SURGEON:  Gae Bon, MD.  ANESTHESIA:  General, nasal intubation.  DESCRIPTION OF PROCEDURE:  The patient was taken to the operating room, placed on the table in supine position.  General anesthesia was administered intravenously and a nasal endotracheal tube was placed. The eyes were protected and the patient was draped for the procedure. Time-out was performed.  The posterior pharynx was suctioned and then the throat pack was placed.  A 2% lidocaine with 1:100,000 epinephrine was infiltrated in an inferior alveolar block on the right and left side and in buccal infiltration of the anterior mandible.  A bite block was placed in the right side of the mouth, and a sweetheart retractor was used to retract the tongue.  A #15 blade was used to make an incision beginning approximately 1 cm proximal to tooth #20 and then the incision was carried forward both buccally and lingually in the gingival sulcus across the midline to tooth #29.  The periosteum was reflected with a periosteal elevator.  The teeth were elevated with a 301 elevator. Then, tooth #20, 23, 24, 25, and 26 were removed with a dental forceps. Upon attempting to remove  teeth #21, 22, 27, and 29, the teeth fractured.  Stryker handpiece under irrigation with a fissure bur was used to remove bone around these teeth and those teeth were then removed.  Then, sockets were curetted.  The periosteum was reflected to expose the alveolar crest and the lingual torus on the left side.  The bone file and egg-shaped bur were then used to perform alveoplasty of the left mandible and move the left mandibular tori.  Then, the area was irrigated and closed with 3-0 chromic.  The bite block and sweetheart retractor were repositioned to the other side of the mouth.  The 15 blade was used to make an incision around teeth #31 and 32, carried forward to the previous incision at tooth #29.  The periosteum was reflected.  The teeth were elevated.  Tooth #31 was removed with forceps.  Tooth #32 fractured and additional bone was removed and the tooth was suctioned using Stryker handpiece with a fissure bur under irrigation.  Then, the sockets were curetted.  The periosteum was reflected to expose the alveolar crest and the lingual tori.  Then, an egg-shaped bur and bone file were used to perform alveoplasty and remove the lingual tori.  Then, the areas were irrigated and then closed with 3-  0 chromic.  The oral cavity was inspected and found to have good contour, hemostasis, and closure.  The area was suctioned and a throat pack was removed.  The patient was awakened, left in the care of anesthesia for transportation to recovery room.  ESTIMATED BLOOD LOSS:  Minimal.  COMPLICATIONS:  None.  SPECIMENS:  None.     Gae Bon, M.D.     SMJ/MEDQ  D:  01/24/2016  T:  01/25/2016  Job:  [id number]

## 2016-01-27 ENCOUNTER — Encounter (HOSPITAL_COMMUNITY): Payer: Self-pay | Admitting: Oral Surgery

## 2016-01-30 LAB — CUP PACEART REMOTE DEVICE CHECK: MDC IDC SESS DTM: 20170605083630

## 2016-02-03 ENCOUNTER — Ambulatory Visit: Payer: Medicare Other

## 2016-02-07 LAB — CUP PACEART REMOTE DEVICE CHECK: Date Time Interrogation Session: 20170705090810

## 2016-02-10 ENCOUNTER — Other Ambulatory Visit: Payer: Self-pay | Admitting: Internal Medicine

## 2016-02-11 ENCOUNTER — Other Ambulatory Visit: Payer: Self-pay | Admitting: Internal Medicine

## 2016-02-11 MED ORDER — CARVEDILOL 12.5 MG PO TABS: 12.5000 mg | ORAL_TABLET | Freq: Every morning | ORAL | 6 refills | Status: DC

## 2016-02-11 NOTE — Telephone Encounter (Signed)
Last appointment 08/01/2015.

## 2016-02-13 ENCOUNTER — Ambulatory Visit (INDEPENDENT_AMBULATORY_CARE_PROVIDER_SITE_OTHER): Payer: Medicare Other | Admitting: Internal Medicine

## 2016-02-13 VITALS — BP 109/61 | HR 52 | Temp 98.2°F | Wt 216.4 lb

## 2016-02-13 DIAGNOSIS — F1990 Other psychoactive substance use, unspecified, uncomplicated: Secondary | ICD-10-CM

## 2016-02-13 DIAGNOSIS — E079 Disorder of thyroid, unspecified: Secondary | ICD-10-CM

## 2016-02-13 DIAGNOSIS — F17210 Nicotine dependence, cigarettes, uncomplicated: Secondary | ICD-10-CM | POA: Diagnosis not present

## 2016-02-13 DIAGNOSIS — R894 Abnormal immunological findings in specimens from other organs, systems and tissues: Secondary | ICD-10-CM

## 2016-02-13 DIAGNOSIS — I4891 Unspecified atrial fibrillation: Secondary | ICD-10-CM | POA: Diagnosis not present

## 2016-02-13 DIAGNOSIS — Z7901 Long term (current) use of anticoagulants: Secondary | ICD-10-CM

## 2016-02-13 DIAGNOSIS — R768 Other specified abnormal immunological findings in serum: Secondary | ICD-10-CM

## 2016-02-13 DIAGNOSIS — D45 Polycythemia vera: Secondary | ICD-10-CM | POA: Diagnosis not present

## 2016-02-13 DIAGNOSIS — Z1211 Encounter for screening for malignant neoplasm of colon: Secondary | ICD-10-CM

## 2016-02-13 NOTE — Assessment & Plan Note (Signed)
A/P: Had colonoscopy with Dr. Fuller Plan February 2016 with polyps removed.  Recommended colonoscopy in 5 years if polyp adenomatous; otherwise 10 years.  Surgical pathology revealed: TUBULAR ADENOMA (3 FRAGMENTS). NO HIGH GRADE DYSPLASIA OR MALIGNANCY IDENTIFIED.  Health maintenance tab indicates due for recall in 2021

## 2016-02-13 NOTE — Assessment & Plan Note (Signed)
A: Doing well, follows with Dr. Alvy Bimler.  Last seen in May 2017.  Has planned 6 month follow up.  P: - will continue following recommendations from heme/onc.

## 2016-02-13 NOTE — Assessment & Plan Note (Signed)
A: Patient currently in normal sinus rhythm, currently on amiodarone and coumadin indefinitely.  Also on Carvedilol.  Follows with Anti-coagulation clinic and Dr. Lovena Le.  CHADSVASc score is > 2.  Plan: - continue coumadin, carvedilol, and amiodarone - follow up with coumadin clinic - last saw Dr. Lovena Le May 2016.  Supposed to have yearly follow up so encouraged patient to do so.

## 2016-02-13 NOTE — Progress Notes (Signed)
   CC: here for f/u for atrial fibrillation, polycythemia vera, positive HCV Ab  HPI:  [name] is a 70 y.o. man with PMHx as below who is presenting today for routine follow up of his atrial fibrillation, PV, and positive HCV Ab during recent hospitalization.  Please see A&P for further details regarding today's visit.  Past Medical History:  Diagnosis Date  . Atrial fibrillation /flutter    hx of flutter ablation 2/12  . Cancer ([place])   . Cardiomyopathy    EF55% 11/14<<35%   . CHF (congestive heart failure) ([place])   . COPD (chronic obstructive pulmonary disease) ([place])   . GERD (gastroesophageal reflux disease)   . Hepatitis    "think I had that once; a long time ago; from dirty needles I think" (12/14/2012)  . Hypertension   . Leukocytosis, unspecified 06/12/2013  . 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 Obesity   . Pneumonia   . Primary polycythemia ([place]) 06/12/2013  . Renal insufficiency   . Shortness of breath    "all the time lately" (12/14/2012)  . Substance abuse    alcohol  . Syncope     Review of Systems:   Review of Systems  Constitutional: Negative for chills and fever.  HENT: Negative for sore throat.        No dysphagia, no symptoms of hypo- or hyperthyroid.  Respiratory: Positive for sputum production (chronic, not worse than baseline.).   Cardiovascular: Negative for chest pain, orthopnea and PND.  Musculoskeletal: Positive for joint pain (shoulder pain in the mornings.).  Psychiatric/Behavioral: Negative for substance abuse.     Physical Exam:  Vitals:   02/13/16 1359  BP: 109/61  Pulse: (!) 52  Temp: 98.2 F (36.8 C)  TempSrc: Oral  Weight: 216 lb 6.4 oz (98.2 kg)   Physical Exam  Constitutional: He is oriented to person, place, and time and well-developed, well-nourished, and in no distress.  HENT:  Head: Normocephalic and atraumatic.  Neck: No tracheal deviation present. No thyromegaly present.  Cardiovascular:  Normal rate, regular rhythm and normal heart sounds.   Pulmonary/Chest: Effort normal and breath sounds normal.  Lymphadenopathy:    He has no cervical adenopathy.  Neurological: He is alert and oriented to person, place, and time.  Skin: Skin is warm and dry.  Psychiatric: Mood and affect normal.    Assessment & Plan:   See Encounters Tab for problem based charting.   Patient discussed with Dr. 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  Prior history of IV drug use.  Last use of IV drugs was  P: - check HCV viral load - will need ID referral based on results.  Encounter for screening colonoscopy A/P: Had colonoscopy with Dr. Fuller Plan February 2016 with polyps removed.  Recommended colonoscopy in 5 years if polyp adenomatous; otherwise 10 years.  Surgical pathology revealed: TUBULAR ADENOMA (3 FRAGMENTS). NO HIGH GRADE DYSPLASIA OR MALIGNANCY IDENTIFIED.  Health maintenance tab indicates due for recall in 2021

## 2016-02-13 NOTE — Assessment & Plan Note (Addendum)
A:  Positive HCV Ab during hospitalization in March 2017.  Prior history of IV drug use.  Last use of IV drugs was 2 years ago.  Previous HCV Ab was positive with undetected viral load, however, due to continued IV drug use after this was checked, I feel that repeating a viral load is warranted.  P: - check HCV viral load - may need ID referral based on results.  ADDENDUM 02/17/2016: - HCV RNA undetected indicating patient cleared infection naturally.

## 2016-02-13 NOTE — Assessment & Plan Note (Signed)
A: CTA in 08/2013 showed a dominant mass of right lobe on thyroid.  Recommended non-emergent [place] to assess.  This was ordered but appears was not completed by patient.  A 04/2014 CTA did not comment on mass, but subsequent CTA in 05/2014 notes a 18x3mm right thyroid nodule.  Patient is without symtpoms of dysphagia, no thyromegaly on exam and no symptoms of hyper or hypothyroidism.  P: - ordered thyroid ultrasound again today.

## 2016-02-13 NOTE — Patient Instructions (Signed)
Thank you for coming to see me today. It was a pleasure. Today we talked about:   Please schedule an appointment with Dr. Lovena Le (Heart Doctor).  Please schedule an appointment with Dr. Elie Confer (Coumadin Doctor).  They will call you about thyroid ultrasound.  I will let you know if your lab work today is abnormal.  Please follow-up with me in 6 months.  If you have any questions or concerns, please do not hesitate to call the office at [phone].  Take Care,   Jule Ser, DO

## 2016-02-15 LAB — HCV RNA QUANT RFLX ULTRA OR GENOTYP: HCV QUANT BASELINE: NOT DETECTED [IU]/mL

## 2016-02-17 ENCOUNTER — Encounter (INDEPENDENT_AMBULATORY_CARE_PROVIDER_SITE_OTHER): Payer: Self-pay

## 2016-02-17 ENCOUNTER — Ambulatory Visit (INDEPENDENT_AMBULATORY_CARE_PROVIDER_SITE_OTHER): Payer: Medicare Other | Admitting: Pharmacist

## 2016-02-17 DIAGNOSIS — I4891 Unspecified atrial fibrillation: Secondary | ICD-10-CM | POA: Diagnosis present

## 2016-02-17 DIAGNOSIS — Z7901 Long term (current) use of anticoagulants: Secondary | ICD-10-CM

## 2016-02-17 LAB — POCT INR: INR: 2.3

## 2016-02-17 NOTE — Progress Notes (Signed)
Internal Medicine Clinic Attending  Case discussed with Dr. Wallace at the time of the visit.  We reviewed the resident's history and exam and pertinent patient test results.  I agree with the assessment, diagnosis, and plan of care documented in the resident's note.  

## 2016-02-17 NOTE — Progress Notes (Signed)
Anticoagulation Management [name] is a 70 y.o. male who reports to the clinic for monitoring of warfarin treatment.    Indication: atrial fibrillationCHA2DS2VASC score of 2, HASBLED score of 1 Duration: indefinite  Anticoagulation Clinic Visit History: Patient does not report signs/symptoms of bleeding or thromboembolism or any other changes  Anticoagulation Episode Summary    Current INR goal:   2.0-3.0  TTR:   64.0 % (3.3 y)  Next INR check:   03/16/2016  INR from last check:   2.3 (02/17/2016)  Weekly max dose:     Target end date:   Indefinite  INR check location:   [facility]  Preferred lab:     Send INR reminders to:      Indications   Atrial fibrillation ([place]) [I48.91]       Comments:          ASSESSMENT Recent Results: The most recent result is correlated with 15 mg per week: Lab Results  Component Value Date   INR 2.3 02/17/2016   INR 1.80 (H) 01/24/2016   INR 2.40 01/06/2016    Anticoagulation Dosing: INR as of 02/17/2016 and Previous Dosing Information    INR Dt INR Goal [name] [name]   02/17/2016 2.3 2.0-3.0 15 mg 2.5 mg 1.25 mg 2.5 mg 2.5 mg 2.5 mg 2.5 mg 1.25 mg    Anticoagulation Dose Instructions as of 02/17/2016      Total [name]   New Dose 15 mg 2.5 mg 1.25 mg 2.5 mg 2.5 mg 2.5 mg 2.5 mg 1.25 mg     (2.5 mg x 1)  (2.5 mg x 0.5)  (2.5 mg x 1)  (2.5 mg x 1)  (2.5 mg x 1)  (2.5 mg x 1)  (2.5 mg x 0.5)                           INR today: Therapeutic  PLAN Weekly dose was unchanged  Patient Instructions  Patient educated about medication as defined in this encounter and verbalized understanding by repeating back instructions provided.    Patient advised to contact clinic or seek medical attention if signs/symptoms of bleeding or thromboembolism occur.  Patient verbalized understanding by repeating back information and was advised to contact me if further medication-related  questions arise. Patient was also provided an information handout.  Follow-up Return in about 4 weeks (around 03/16/2016) for Follow up INR 03/16/16 around 9:30am.  [name] J  15 minutes spent face-to-face with the patient during the encounter. 50% of time spent on education. 50% of time was spent on assessment and plan.

## 2016-02-17 NOTE — Patient Instructions (Signed)
Patient educated about medication as defined in this encounter and verbalized understanding by repeating back instructions provided.   

## 2016-02-21 ENCOUNTER — Ambulatory Visit (INDEPENDENT_AMBULATORY_CARE_PROVIDER_SITE_OTHER): Payer: Medicare Other | Admitting: *Deleted

## 2016-02-21 DIAGNOSIS — R55 Syncope and collapse: Secondary | ICD-10-CM

## 2016-02-21 NOTE — Progress Notes (Signed)
Carelink Summary Report / Loop Recorder 

## 2016-02-24 ENCOUNTER — Ambulatory Visit (HOSPITAL_COMMUNITY): Payer: Medicare Other

## 2016-02-27 NOTE — Progress Notes (Signed)
I have reviewed Dr. Julianne Rice note.  Patient is on [facility] for Afib.  INR at goal.

## 2016-03-02 LAB — CUP PACEART REMOTE DEVICE CHECK: Date Time Interrogation Session: 20170804090738

## 2016-03-08 ENCOUNTER — Other Ambulatory Visit: Payer: Self-pay | Admitting: Internal Medicine

## 2016-03-08 DIAGNOSIS — I4891 Unspecified atrial fibrillation: Secondary | ICD-10-CM

## 2016-03-08 DIAGNOSIS — D45 Polycythemia vera: Secondary | ICD-10-CM

## 2016-03-16 ENCOUNTER — Ambulatory Visit (INDEPENDENT_AMBULATORY_CARE_PROVIDER_SITE_OTHER): Payer: Medicare Other | Admitting: Pharmacist

## 2016-03-16 DIAGNOSIS — Z7901 Long term (current) use of anticoagulants: Secondary | ICD-10-CM

## 2016-03-16 DIAGNOSIS — I4891 Unspecified atrial fibrillation: Secondary | ICD-10-CM | POA: Diagnosis not present

## 2016-03-16 LAB — POCT INR: INR: 2.3

## 2016-03-16 NOTE — Progress Notes (Signed)
  Anti-Coagulation Progress Note  [name] is a 70 y.o. male who is currently on an anti-coagulation regimen.    RECENT RESULTS: Recent results are below, the most recent result is correlated with a dose of 15 mg. per week: Lab Results  Component Value Date   INR 2.30 03/16/2016   INR 2.3 02/17/2016   INR 1.80 (H) 01/24/2016    ANTI-COAG DOSE:    ANTICOAG SUMMARY: Anticoagulation Episode Summary    Current INR goal:   2.0-3.0  TTR:   64.8 % (3.3 y)  Next INR check:   03/16/2016  INR from last check:   2.3 (02/17/2016)  Most recent INR:    2.30 (03/16/2016)  Weekly max dose:     Target end date:   Indefinite  INR check location:   [facility]  Preferred lab:     Send INR reminders to:      Indications   Atrial fibrillation ([place]) [I48.91]       Comments:           ANTICOAG TODAY:   PATIENT INSTRUCTIONS: There are no Patient Instructions on file for this visit.   FOLLOW-UP Return in about 4 weeks (around 04/13/2016) for Follow up INR at 0845h.  [name], III Pharm.D., CACP

## 2016-03-16 NOTE — Patient Instructions (Signed)
Patient instructed to take medications as defined in the Anti-coagulation Track section of this encounter.  Patient instructed to take today's dose.  Patient verbalized understanding of these instructions.    

## 2016-03-19 ENCOUNTER — Encounter: Payer: Self-pay | Admitting: Internal Medicine

## 2016-03-24 ENCOUNTER — Ambulatory Visit (INDEPENDENT_AMBULATORY_CARE_PROVIDER_SITE_OTHER): Payer: Medicare Other | Admitting: *Deleted

## 2016-03-24 DIAGNOSIS — R55 Syncope and collapse: Secondary | ICD-10-CM | POA: Diagnosis not present

## 2016-03-24 LAB — CUP PACEART REMOTE DEVICE CHECK: Date Time Interrogation Session: 20170903090624

## 2016-03-24 NOTE — Progress Notes (Signed)
Carelink Summary Report / Loop Recorder 

## 2016-04-06 ENCOUNTER — Ambulatory Visit (INDEPENDENT_AMBULATORY_CARE_PROVIDER_SITE_OTHER): Payer: Medicare Other | Admitting: Internal Medicine

## 2016-04-06 ENCOUNTER — Encounter: Payer: Self-pay | Admitting: Internal Medicine

## 2016-04-06 VITALS — BP 148/76 | HR 52 | Ht 66.0 in | Wt 222.4 lb

## 2016-04-06 DIAGNOSIS — I480 Paroxysmal atrial fibrillation: Secondary | ICD-10-CM | POA: Diagnosis not present

## 2016-04-06 DIAGNOSIS — R55 Syncope and collapse: Secondary | ICD-10-CM

## 2016-04-06 NOTE — Patient Instructions (Addendum)
Medication Instructions:    Your physician recommends that you continue on your current medications as directed. Please refer to the Current Medication list given to you today.  --- If you need a refill on your cardiac medications before your next appointment, please call your pharmacy. ---  Labwork:  None ordered  Testing/Procedures:  None ordered  Follow-Up:  Continue your monthly monitor checks for your loop recorder.   Your physician wants you to follow-up in: 8 months with Dr. Lovena Le.  You will receive a reminder letter in the mail two months in advance. If you don't receive a letter, please call our office to schedule the follow-up appointment.   Thank you for choosing [facility]!!

## 2016-04-06 NOTE — Progress Notes (Signed)
HPI [name] returns today for on going evaluation of atrial fibrillation and LV dysfunction. He has a h/o syncope and is s/p ILR.   He denies syncope. No chest pain and no syncope. He has had no palpitations. No trouble taking medications. He has done well on diuretic therapy and has had minimal if any peripheral edema. He denies palpitations. He has had no problems with his amiodarone. No syncope. No Known Allergies   Current Outpatient Prescriptions  Medication Sig Dispense Refill  . albuterol (PROAIR HFA) 108 (90 Base) MCG/ACT inhaler Inhale 1-2 puffs into the lungs every 6 (six) hours as needed for wheezing or shortness of breath. PLACE ON HOLD UNTIL PATIENT REQUESTS TO FILL (Patient taking differently: Inhale 1-2 puffs into the lungs every 6 (six) hours as needed for wheezing or shortness of breath. ) 18 g 3  . amiodarone (PACERONE) 200 MG tablet Take 1 tablet (200 mg total) by mouth daily. 30 tablet 6  . carvedilol (COREG) 12.5 MG tablet Take 1 tablet (12.5 mg total) by mouth every morning. 30 tablet 6  . fluticasone (FLOVENT HFA) 44 MCG/ACT inhaler Inhale 2 puffs into the lungs 2 (two) times daily.    . folic acid (FOLVITE) 1 MG tablet Take 1 tablet (1 mg total) by mouth daily. 90 tablet 3  . furosemide (LASIX) 40 MG tablet Take 1 tablet (40 mg total) by mouth daily. 90 tablet 3  . hydroxyurea (HYDREA) 500 MG capsule take 1 capsule by mouth ON MONDAY/WEDNESDAY/FRIDAY, AND TAKE 2 CAPS ALL OTHER DAYS 45 capsule 3  . SUBOXONE 8-2 MG FILM Place 1 Film under the tongue daily.     [name] tiotropium (SPIRIVA HANDIHALER) 18 MCG inhalation capsule Place 1 capsule (18 mcg total) into inhaler and inhale daily. PLACE ON HOLD UNTIL PATIENT REQUESTS TO FILL 30 capsule 11  . warfarin (COUMADIN) 2.5 MG tablet take 1 tablet by mouth once daily EXCEPT TAKE 1/2 TABLET ON TUESDAYS AND FRIDAYS 30 tablet 3   No current facility-administered medications for this visit.      Past Medical History:  Diagnosis  Date  . Atrial fibrillation /flutter    hx of flutter ablation 2/12  . Cancer ([place])   . Cardiomyopathy    EF55% 11/14<<35%   . CHF (congestive heart failure) ([place])   . COPD (chronic obstructive pulmonary disease) ([place])   . GERD (gastroesophageal reflux disease)   . Hepatitis    "think I had that once; a long time ago; from dirty needles I think" (12/14/2012)  . Hypertension   . Leukocytosis, unspecified 06/12/2013  . 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 Obesity   . Pneumonia   . Primary polycythemia ([place]) 06/12/2013  . Renal insufficiency   . Shortness of breath    "all the time lately" (12/14/2012)  . Substance abuse    alcohol  . Syncope     ROS:   All systems reviewed and negative except as noted in the HPI.   Past Surgical History:  Procedure Laterality Date  . CARDIAC ELECTROPHYSIOLOGY MAPPING AND ABLATION  08/2010   [name] 09/07/2010 (12/14/2012)  . COLONOSCOPY     15-20 years ago had colon in [place]  1970's  . LOOP RECORDER IMPLANT  08-22-2013   MDT LinQ implanted by Dr Caryl Comes for syncope  . LOOP RECORDER IMPLANT N/A 08/23/2013   Procedure: LOOP RECORDER IMPLANT;  Surgeon: Deboraha Sprang, MD;  Location: [facility] CATH LAB;  Service: Cardiovascular;  Laterality: N/A;  .  MULTIPLE EXTRACTIONS WITH ALVEOLOPLASTY Bilateral 01/24/2016   Procedure: MULTIPLE EXTRACTION WITH ALVEOLOPLASTY BILATERAL;  Surgeon: Diona Browner, DDS;  Location: [place];  Service: Oral Surgery;  Laterality: Bilateral;  . MULTIPLE TOOTH EXTRACTIONS  01/24/2016   MULTIPLE EXTRACTION WITH ALVEOLOPLASTY BILATERAL (Bilateral)     Family History  Problem Relation Age of Onset  . Diabetes Mother   . Hypertension Mother   . Cirrhosis Father   . Alcohol abuse Father   . Colon cancer Neg Hx   . Rectal cancer Neg Hx   . Stomach cancer Neg Hx      Social History   Social History  . Marital status: Single    Spouse name: N/A  . Number of children: N/A  . Years of education: N/A    Occupational History  . Retired    Social History Main Topics  . Smoking status: Current Every Day Smoker    Packs/day: 0.20    Years: 40.00    Types: Cigarettes  . Smokeless tobacco: Never Used     Comment: patient smoked 1-2 cigs x 50 years, now smoking approx 4-5 cigs per day x 1 year  . Alcohol use 0.0 oz/week     Comment: beer occasionally  . Drug use:     Types: "Crack" cocaine, Cocaine, Heroin     Comment: 03/20/2013 "been clean for awhile"; endorses using heroin "all my life" up until about a year ago-- currently denies any drug use-- on suboxone.   . Sexual activity: Not Currently   Other Topics Concern  . Not on file   Social History Narrative   Moved here from [place]. Lives with common law wife and grandchildren. 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."     BP (!) 148/76   Pulse (!) 52   Ht 5\' 6"  (1.676 m)   Wt 222 lb 6.4 oz (100.9 kg)   BMI 35.90 kg/m   Physical Exam:  stable appearing 70 yo man, NAD HEENT: Unremarkable Neck:  6 cm JVD, no thyromegally Lungs:  Clear with no wheezes, rales, or rhonchi HEART:  Regular rate rhythm, no murmurs, no rubs, no clicks Abd:  soft, positive bowel sounds, no organomegally, no rebound, no guarding Ext:  2 plus pulses, no edema, no cyanosis, no clubbing Skin:  No rashes no nodules Neuro:  CN II through XII intact, motor grossly intact  ILR - interogation of his ILR demonstrates no pauses and no atrial fib   Assess/Plan: 1. Syncope - no additional episodes. He has had some daytime brady but is asymptomatic. 2. PAF - he is maintaining NSR nicely on amio 200 mg daily 3. HTN- his blood pressure is a little high today and I encouraged him to reduce his salt intake and to lose weight.  [name].D.

## 2016-04-13 ENCOUNTER — Ambulatory Visit: Payer: Medicare Other

## 2016-04-19 NOTE — Progress Notes (Signed)
Carelink summary report received. Battery status OK. Normal device function. No new symptom episodes, tachy episodes, brady, or pause episodes. 13 AF 1.1% No ECGs, known PAF +Warfarin. Monthly summary reports and ROV/PRN

## 2016-04-20 ENCOUNTER — Ambulatory Visit (INDEPENDENT_AMBULATORY_CARE_PROVIDER_SITE_OTHER): Payer: Medicare Other | Admitting: Pharmacist

## 2016-04-20 DIAGNOSIS — Z7901 Long term (current) use of anticoagulants: Secondary | ICD-10-CM

## 2016-04-20 DIAGNOSIS — I4891 Unspecified atrial fibrillation: Secondary | ICD-10-CM

## 2016-04-20 LAB — POCT INR: INR: 2.7

## 2016-04-20 NOTE — Patient Instructions (Signed)
Patient instructed to take medications as defined in the Anti-coagulation Track section of this encounter.  Patient instructed to take today's dose.  Patient verbalized understanding of these instructions.    

## 2016-04-20 NOTE — Progress Notes (Signed)
Anti-Coagulation Progress Note  [name] is a 70 y.o. male who is currently on an anti-coagulation regimen.    RECENT RESULTS: Recent results are below, the most recent result is correlated with a dose of 15 mg. per week: Lab Results  Component Value Date   INR 2.70 04/20/2016   INR 2.30 03/16/2016   INR 2.3 02/17/2016    ANTI-COAG DOSE: Anticoagulation Dose Instructions as of 04/20/2016      [name] Tue Wed Thu Fri Sat   New Dose 2.5 mg 1.25 mg 2.5 mg 2.5 mg 2.5 mg 2.5 mg 1.25 mg       ANTICOAG SUMMARY: Anticoagulation Episode Summary    Current INR goal:   2.0-3.0  TTR:   65.8 % (3.4 y)  Next INR check:   05/18/2016  INR from last check:   2.70 (04/20/2016)  Weekly max dose:     Target end date:   Indefinite  INR check location:   [facility]  Preferred lab:     Send INR reminders to:      Indications   Atrial fibrillation ([place]) [I48.91]       Comments:           ANTICOAG TODAY: Anticoagulation Summary  As of 04/20/2016   INR goal:   2.0-3.0  TTR:     Today's INR:   2.70  Next INR check:   05/18/2016  Target end date:   Indefinite   Indications   Atrial fibrillation ([facility]) [I48.91]        Anticoagulation Episode Summary    INR check location:   [facility]   Preferred lab:      Send INR reminders to:      Comments:         PATIENT INSTRUCTIONS: There are no Patient Instructions on file for this visit.   FOLLOW-UP Return in 4 weeks (on 05/18/2016) for Follow up INR at 0900h.  [name], III Pharm.D., CACP

## 2016-04-21 ENCOUNTER — Ambulatory Visit (INDEPENDENT_AMBULATORY_CARE_PROVIDER_SITE_OTHER): Payer: Medicare Other | Admitting: *Deleted

## 2016-04-21 DIAGNOSIS — R55 Syncope and collapse: Secondary | ICD-10-CM

## 2016-04-22 NOTE — Progress Notes (Signed)
Carelink Summary Report / Loop Recorder 

## 2016-05-18 ENCOUNTER — Ambulatory Visit (INDEPENDENT_AMBULATORY_CARE_PROVIDER_SITE_OTHER): Payer: Medicare Other | Admitting: Pharmacist

## 2016-05-18 DIAGNOSIS — I4891 Unspecified atrial fibrillation: Secondary | ICD-10-CM | POA: Diagnosis not present

## 2016-05-18 DIAGNOSIS — Z7901 Long term (current) use of anticoagulants: Secondary | ICD-10-CM | POA: Diagnosis not present

## 2016-05-18 LAB — POCT INR: INR: 3

## 2016-05-18 NOTE — Progress Notes (Signed)
Indication: Atrial fibrillation. Duration: Indefinite. INR: At target. Agree with Dr. Groce's assessment and plan. 

## 2016-05-18 NOTE — Patient Instructions (Signed)
Patient instructed to take medications as defined in the Anti-coagulation Track section of this encounter.  Patient instructed to take today's dose.  Patient verbalized understanding of these instructions.    

## 2016-05-18 NOTE — Progress Notes (Signed)
Anti-Coagulation Progress Note  [name] is a 70 y.o. male who is currently on an anti-coagulation regimen.    RECENT RESULTS: Recent results are below, the most recent result is correlated with a dose of 15 mg. per week: Lab Results  Component Value Date   INR 3.00 05/18/2016   INR 2.70 04/20/2016   INR 2.30 03/16/2016    ANTI-COAG DOSE: Anticoagulation Dose Instructions as of 05/18/2016      [name] Tue Wed Thu Fri Sat   New Dose 2.5 mg 2.5 mg 1.25 mg 2.5 mg 1.25 mg 2.5 mg 1.25 mg    Description   Take 1/2 tablet on Tuesdays/Thursdays/Saturdays; all other days--take 1 tablet.       ANTICOAG SUMMARY: Anticoagulation Episode Summary    Current INR goal:   2.0-3.0  TTR:   66.6 % (3.5 y)  Next INR check:   06/15/2016  INR from last check:   3.00 (05/18/2016)  Weekly max dose:     Target end date:   Indefinite  INR check location:   [facility]  Preferred lab:     Send INR reminders to:      Indications   Atrial fibrillation ([place]) [I48.91]       Comments:           ANTICOAG TODAY: Anticoagulation Summary  As of 05/18/2016   INR goal:   2.0-3.0  TTR:     Today's INR:   3.00  Next INR check:   06/15/2016  Target end date:   Indefinite   Indications   Atrial fibrillation ([facility]) [I48.91]        Anticoagulation Episode Summary    INR check location:   [facility]   Preferred lab:      Send INR reminders to:      Comments:         PATIENT INSTRUCTIONS: Take 1/2 tablet on Tuesdays/Thursdays/Saturdays; all other days--take 1 tablet.    FOLLOW-UP Return in 4 weeks (on 06/15/2016) for Follow  up INR at 0915h.  [name], III Pharm.D., CACP

## 2016-05-21 ENCOUNTER — Ambulatory Visit (INDEPENDENT_AMBULATORY_CARE_PROVIDER_SITE_OTHER): Payer: Medicare Other | Admitting: *Deleted

## 2016-05-21 DIAGNOSIS — R55 Syncope and collapse: Secondary | ICD-10-CM | POA: Diagnosis not present

## 2016-05-21 NOTE — Progress Notes (Signed)
Carelink Summary Report / Loop Recorder 

## 2016-05-25 ENCOUNTER — Telehealth: Payer: Self-pay | Admitting: Hematology and Oncology

## 2016-05-25 ENCOUNTER — Other Ambulatory Visit (HOSPITAL_BASED_OUTPATIENT_CLINIC_OR_DEPARTMENT_OTHER): Payer: Medicare Other

## 2016-05-25 ENCOUNTER — Encounter: Payer: Self-pay | Admitting: Hematology and Oncology

## 2016-05-25 ENCOUNTER — Ambulatory Visit (HOSPITAL_BASED_OUTPATIENT_CLINIC_OR_DEPARTMENT_OTHER): Payer: Medicare Other | Admitting: Hematology and Oncology

## 2016-05-25 VITALS — BP 130/69 | HR 61 | Temp 98.4°F | Resp 17 | Ht 66.0 in | Wt 219.5 lb

## 2016-05-25 DIAGNOSIS — Z452 Encounter for adjustment and management of vascular access device: Secondary | ICD-10-CM | POA: Insufficient documentation

## 2016-05-25 DIAGNOSIS — Z720 Tobacco use: Secondary | ICD-10-CM

## 2016-05-25 DIAGNOSIS — D45 Polycythemia vera: Secondary | ICD-10-CM

## 2016-05-25 DIAGNOSIS — Z299 Encounter for prophylactic measures, unspecified: Secondary | ICD-10-CM

## 2016-05-25 DIAGNOSIS — D471 Chronic myeloproliferative disease: Secondary | ICD-10-CM

## 2016-05-25 DIAGNOSIS — Z23 Encounter for immunization: Secondary | ICD-10-CM

## 2016-05-25 LAB — CBC WITH DIFFERENTIAL/PLATELET
BASO%: 1 % (ref 0.0–2.0)
BASOS ABS: 0.1 10*3/uL (ref 0.0–0.1)
EOS ABS: 0.3 10*3/uL (ref 0.0–0.5)
EOS%: 4.2 % (ref 0.0–7.0)
HCT: 45.8 % (ref 38.4–49.9)
HEMOGLOBIN: 15.5 g/dL (ref 13.0–17.1)
LYMPH%: 17.2 % (ref 14.0–49.0)
MCH: 36.8 pg — AB (ref 27.2–33.4)
MCHC: 33.8 g/dL (ref 32.0–36.0)
MCV: 108.8 fL — AB (ref 79.3–98.0)
MONO#: 0.6 10*3/uL (ref 0.1–0.9)
MONO%: 9.8 % (ref 0.0–14.0)
NEUT%: 67.8 % (ref 39.0–75.0)
NEUTROS ABS: 4 10*3/uL (ref 1.5–6.5)
PLATELETS: 168 10*3/uL (ref 140–400)
RBC: 4.21 10*6/uL (ref 4.20–5.82)
RDW: 13.4 % (ref 11.0–14.6)
WBC: 6 10*3/uL (ref 4.0–10.3)
lymph#: 1 10*3/uL (ref 0.9–3.3)

## 2016-05-25 MED ORDER — INFLUENZA VAC SPLIT QUAD 0.5 ML IM SUSY
0.5000 mL | PREFILLED_SYRINGE | Freq: Once | INTRAMUSCULAR | Status: AC
  Administered 2016-05-25: 0.5 mL via INTRAMUSCULAR
  Filled 2016-05-25: qty 0.5

## 2016-05-25 NOTE — Telephone Encounter (Signed)
Gave patient avs report and appointments for May 2018.

## 2016-05-25 NOTE — Assessment & Plan Note (Signed)
I spent some time counseling the patient the importance of tobacco cessation. He is currently attempting to quit on his own.  

## 2016-05-25 NOTE — Assessment & Plan Note (Signed)
The patient has JAK2 mutation positive myeloproliferative disorder. He tolerated the hydroxyurea well.  He is tolerating hydroxyurea dose at 500 mg on Mondays, Wednesdays and Fridays and to take 1000 mg for the rest of the week and he labs are back to within normal limits I recommend we continue the same and I will see him back in 6 months with repeat history, physical examination and blood work

## 2016-05-25 NOTE — Progress Notes (Signed)
[place] OFFICE PROGRESS NOTE  Patient Care Team: Jule Ser, DO as PCP - General Heath Lark, MD as Consulting Physician (Hematology and Oncology)  SUMMARY OF ONCOLOGIC HISTORY:  This is a pleasant gentleman who is being referred here because of high hemoglobin level. In November 2014, we removed one unit of blood and order an additional workup to rule out myeloproliferative disorder. Blood test result confirmed low erythropoietin level along with detectable JAK 2 mutation, confirmed myeloproliferative disorder Unfortunately, bone marrow biopsy on 06/26/2013 was nondiagnostic On 07/31/13 the patient was started on 1 hydroxyurea a day On 08/15/2013, increased hydroxyurea to 2 tablets a day and remove one more unit of blood due to the high hemoglobin On 09/05/2013, I increased hydroxyurea to 3 tablets a day and remove one more unit of blood due to high hemoglobin and hematocrit On 01/23/2014, dose of hydroxyurea is reduced to 2 tablets a day due to anemia. On 04/23/2015, dose of hydroxyurea is adjusted to 500 mg on Mondays, Wednesdays and Fridays and to take 1000 mg for the rest of the week  INTERVAL HISTORY: Please see below for problem oriented charting. He feels well. Denies recent infection. The patient denies any recent signs or symptoms of bleeding such as spontaneous epistaxis, hematuria or hematochezia. He is compliant taking his medication as instructed. He continues to smoke, unable to quit  REVIEW OF SYSTEMS:   Constitutional: Denies fevers, chills or abnormal weight loss Eyes: Denies blurriness of vision Ears, nose, mouth, throat, and face: Denies mucositis or sore throat Respiratory: Denies cough, dyspnea or wheezes Cardiovascular: Denies palpitation, chest discomfort or lower extremity swelling Gastrointestinal:  Denies nausea, heartburn or change in bowel habits Skin: Denies abnormal skin rashes Lymphatics: Denies new lymphadenopathy or easy  bruising Neurological:Denies numbness, tingling or new weaknesses Behavioral/Psych: Mood is stable, no new changes  All other systems were reviewed with the patient and are negative.  I have reviewed the past medical history, past surgical history, social history and family history with the patient and they are unchanged from previous note.  ALLERGIES:  has No Known Allergies.  MEDICATIONS:  Current Outpatient Prescriptions  Medication Sig Dispense Refill  . albuterol (PROAIR HFA) 108 (90 Base) MCG/ACT inhaler Inhale 1-2 puffs into the lungs every 6 (six) hours as needed for wheezing or shortness of breath. PLACE ON HOLD UNTIL PATIENT REQUESTS TO FILL (Patient taking differently: Inhale 1-2 puffs into the lungs every 6 (six) hours as needed for wheezing or shortness of breath. ) 18 g 3  . amiodarone (PACERONE) 200 MG tablet Take 1 tablet (200 mg total) by mouth daily. 30 tablet 6  . carvedilol (COREG) 12.5 MG tablet Take 1 tablet (12.5 mg total) by mouth every morning. 30 tablet 6  . fluticasone (FLOVENT HFA) 44 MCG/ACT inhaler Inhale 2 puffs into the lungs 2 (two) times daily.    . folic acid (FOLVITE) 1 MG tablet Take 1 tablet (1 mg total) by mouth daily. 90 tablet 3  . furosemide (LASIX) 40 MG tablet Take 1 tablet (40 mg total) by mouth daily. 90 tablet 3  . hydroxyurea (HYDREA) 500 MG capsule take 1 capsule by mouth ON MONDAY/WEDNESDAY/FRIDAY, AND TAKE 2 CAPS ALL OTHER DAYS 45 capsule 3  . SUBOXONE 8-2 MG FILM Place 1 Film under the tongue daily.     [name] tiotropium (SPIRIVA HANDIHALER) 18 MCG inhalation capsule Place 1 capsule (18 mcg total) into inhaler and inhale daily. PLACE ON HOLD UNTIL PATIENT REQUESTS TO FILL 30  capsule 11  . warfarin (COUMADIN) 2.5 MG tablet take 1 tablet by mouth once daily EXCEPT TAKE 1/2 TABLET ON TUESDAYS AND FRIDAYS 30 tablet 3   No current facility-administered medications for this visit.     PHYSICAL EXAMINATION: ECOG PERFORMANCE STATUS: 0 -  Asymptomatic  Vitals:   05/25/16 0923  BP: 130/69  Pulse: 61  Resp: 17  Temp: 98.4 F (36.9 C)   Filed Weights   05/25/16 0923  Weight: 219 lb 8 oz (99.6 kg)    GENERAL:alert, no distress and comfortable SKIN: skin color, texture, turgor are normal, no rashes or significant lesions EYES: normal, Conjunctiva are pink and non-injected, sclera clear OROPHARYNX:no exudate, no erythema and lips, buccal mucosa, and tongue normal  NECK: supple, thyroid normal size, non-tender, without nodularity LYMPH:  no palpable lymphadenopathy in the cervical, axillary or inguinal LUNGS: clear to auscultation and percussion with normal breathing effort HEART: regular rate & rhythm and no murmurs and no lower extremity edema ABDOMEN:abdomen soft, non-tender and normal bowel sounds Musculoskeletal:no cyanosis of digits and no clubbing  NEURO: alert & oriented x 3 with fluent speech, no focal motor/sensory deficits  LABORATORY DATA:  I have reviewed the data as listed    Component Value Date/Time   NA 140 01/24/2016 0727   NA 143 08/01/2015 1351   NA 143 06/12/2013 1125   K 3.7 01/24/2016 0727   K 3.7 06/12/2013 1125   CL 106 01/24/2016 0727   CO2 27 01/24/2016 0727   CO2 32 (H) 06/12/2013 1125   GLUCOSE 102 (H) 01/24/2016 0727   GLUCOSE 92 06/12/2013 1125   BUN 21 (H) 01/24/2016 0727   BUN 22 08/01/2015 1351   BUN 14.6 06/12/2013 1125   CREATININE 1.19 01/24/2016 0727   CREATININE 1.05 08/30/2014 1628   CREATININE 0.9 06/12/2013 1125   CALCIUM 9.0 01/24/2016 0727   CALCIUM 8.9 06/12/2013 1125   PROT 6.9 01/24/2016 0727   PROT 6.9 06/12/2013 1125   ALBUMIN 3.2 (L) 01/24/2016 0727   ALBUMIN 3.3 (L) 06/12/2013 1125   AST 23 01/24/2016 0727   AST 21 06/12/2013 1125   ALT 19 01/24/2016 0727   ALT 28 06/12/2013 1125   ALKPHOS 85 01/24/2016 0727   ALKPHOS 112 06/12/2013 1125   BILITOT 0.8 01/24/2016 0727   BILITOT 0.82 06/12/2013 1125   GFRNONAA >60 01/24/2016 0727   GFRNONAA 73  08/30/2014 1628   GFRAA >60 01/24/2016 0727   GFRAA 84 08/30/2014 1628    No results found for: SPEP, UPEP  Lab Results  Component Value Date   WBC 6.0 05/25/2016   NEUTROABS 4.0 05/25/2016   HGB 15.5 05/25/2016   HCT 45.8 05/25/2016   MCV 108.8 (H) 05/25/2016   PLT 168 05/25/2016      Chemistry      Component Value Date/Time   NA 140 01/24/2016 0727   NA 143 08/01/2015 1351   NA 143 06/12/2013 1125   K 3.7 01/24/2016 0727   K 3.7 06/12/2013 1125   CL 106 01/24/2016 0727   CO2 27 01/24/2016 0727   CO2 32 (H) 06/12/2013 1125   BUN 21 (H) 01/24/2016 0727   BUN 22 08/01/2015 1351   BUN 14.6 06/12/2013 1125   CREATININE 1.19 01/24/2016 0727   CREATININE 1.05 08/30/2014 1628   CREATININE 0.9 06/12/2013 1125      Component Value Date/Time   CALCIUM 9.0 01/24/2016 0727   CALCIUM 8.9 06/12/2013 1125   ALKPHOS 85 01/24/2016 0727   ALKPHOS  112 06/12/2013 1125   AST 23 01/24/2016 0727   AST 21 06/12/2013 1125   ALT 19 01/24/2016 0727   ALT 28 06/12/2013 1125   BILITOT 0.8 01/24/2016 0727   BILITOT 0.82 06/12/2013 1125       ASSESSMENT & PLAN:  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 He tolerated the hydroxyurea well.  He is tolerating hydroxyurea dose at 500 mg on Mondays, Wednesdays and Fridays and to take 1000 mg for the rest of the week and he labs are back to within normal limits I recommend we continue the same and I will see him back in 6 months with repeat history, physical examination and blood work   Tobacco abuse I spent some time counseling the patient the importance of tobacco cessation. He is currently attempting to quit on his own   Preventive measure We discussed the importance of preventive care and reviewed the vaccination programs. He does not have any prior allergic reactions to influenza vaccination. He agrees to proceed with influenza vaccination today and we will administer it today at the  clinic.    Orders Placed This Encounter  Procedures  . CBC with Differential/Platelet    Standing Status:   Future    Standing Expiration Date:   06/29/2017   All questions were answered. The patient knows to call the clinic with any problems, questions or concerns. No barriers to learning was detected. I spent 15 minutes counseling the patient face to face. The total time spent in the appointment was 20 minutes and more than 50% was on counseling and review of test results     Heath Lark, MD 05/25/2016 10:26 AM

## 2016-05-25 NOTE — Assessment & Plan Note (Signed)
We discussed the importance of preventive care and reviewed the vaccination programs. He does not have any prior allergic reactions to influenza vaccination. He agrees to proceed with influenza vaccination today and we will administer it today at the clinic.  

## 2016-05-30 LAB — CUP PACEART REMOTE DEVICE CHECK
Date Time Interrogation Session: 20171003093738
Implantable Pulse Generator Implant Date: 20150204

## 2016-05-30 NOTE — Progress Notes (Signed)
Carelink summary report received. Battery status OK. Normal device function. No new symptom episodes, tachy episodes, brady, or pause episodes. 21 AF 1.5% +warfarin +amiodarone. Monthly summary reports and ROV/PRN

## 2016-06-15 ENCOUNTER — Ambulatory Visit (INDEPENDENT_AMBULATORY_CARE_PROVIDER_SITE_OTHER): Payer: Medicare Other | Admitting: Pharmacist

## 2016-06-15 DIAGNOSIS — I4891 Unspecified atrial fibrillation: Secondary | ICD-10-CM | POA: Diagnosis not present

## 2016-06-15 DIAGNOSIS — Z7901 Long term (current) use of anticoagulants: Secondary | ICD-10-CM | POA: Diagnosis not present

## 2016-06-15 LAB — POCT INR: INR: 2.3

## 2016-06-15 NOTE — Progress Notes (Signed)
INTERNAL MEDICINE TEACHING ATTENDING ADDENDUM - Lucious Groves, DO Duration- indefinate, Indication- afib, INR-  Lab Results  Component Value Date   INR 2.30 06/15/2016  . Agree with pharmacy recommendations as outlined in their note.

## 2016-06-15 NOTE — Patient Instructions (Signed)
Patient instructed to take medications as defined in the Anti-coagulation Track section of this encounter.  Patient instructed to take today's dose.  Patient instructed to take 1/2 tablet on Thursdays and Saturdays; all other days--take 1 tablet of your warfarin 2.5mg  strength tablets.  Patient verbalized understanding of these instructions.

## 2016-06-15 NOTE — Progress Notes (Signed)
Anti-Coagulation Progress Note  [name] is a 70 y.o. male who is currently on an anti-coagulation regimen.    RECENT RESULTS: Recent results are below, the most recent result is correlated with a dose of 13.75 mg. per week: Lab Results  Component Value Date   INR 2.30 06/15/2016   INR 3.00 05/18/2016   INR 2.70 04/20/2016    ANTI-COAG DOSE: Anticoagulation Dose Instructions as of 06/15/2016      [name] Tue Wed Thu Fri Sat   New Dose 2.5 mg 2.5 mg 1.25 mg 2.5 mg 1.25 mg 2.5 mg 1.25 mg    Description   Take 1/2 tablet on Thursdays/Saturdays; all other days--take 1 tablet.       ANTICOAG SUMMARY: Anticoagulation Episode Summary    Current INR goal:   2.0-3.0  TTR:   67.3 % (3.6 y)  Next INR check:   07/27/2016  INR from last check:   2.30 (06/15/2016)  Weekly max dose:     Target end date:   Indefinite  [facility] check location:   [facility]  Preferred lab:     Send INR reminders to:      Indications   Atrial fibrillation ([place]) [I48.91]       Comments:           ANTICOAG TODAY: Anticoagulation Summary  As of 06/15/2016   INR goal:   2.0-3.0  TTR:     Today's INR:   2.30  Next INR check:   07/27/2016  Target end date:   Indefinite   Indications   Atrial fibrillation ([facility]) [I48.91]        Anticoagulation Episode Summary    IN[facility] check location:   [facility]   Preferred lab:      Send INR reminders to:      Comments:         PATIENT INSTRUCTIONS: Patient instructed to take 1/2 tablet on Thursdays and Saturdays; all other days--take 1 tablet of your warfarin 2.5mg  strength tablets.  FOLLOW-UP Return in 6 weeks (on 07/27/2016) for Follow up INR at 0845h.  [name], III Pharm.D., CACP

## 2016-06-22 ENCOUNTER — Ambulatory Visit (INDEPENDENT_AMBULATORY_CARE_PROVIDER_SITE_OTHER): Payer: Medicare Other | Admitting: *Deleted

## 2016-06-22 DIAGNOSIS — R55 Syncope and collapse: Secondary | ICD-10-CM | POA: Diagnosis not present

## 2016-06-22 NOTE — Progress Notes (Signed)
Carelink Summary Report / Loop Recorder 

## 2016-06-27 LAB — CUP PACEART REMOTE DEVICE CHECK
Date Time Interrogation Session: 20171102113630
Implantable Pulse Generator Implant Date: 20150204

## 2016-06-27 NOTE — Progress Notes (Signed)
Carelink summary report received. Battery status OK. Normal device function. No new symptom episodes, tachy episodes, brady, or pause episodes. 75 AF 7.2% 3 w/ ECGs appear AF. +amio +warfarin. Monthly summary reports and ROV/PRN

## 2016-07-15 ENCOUNTER — Other Ambulatory Visit: Payer: Self-pay | Admitting: Internal Medicine

## 2016-07-21 ENCOUNTER — Other Ambulatory Visit: Payer: Self-pay | Admitting: Internal Medicine

## 2016-07-21 ENCOUNTER — Ambulatory Visit (INDEPENDENT_AMBULATORY_CARE_PROVIDER_SITE_OTHER): Payer: Medicare Other | Admitting: *Deleted

## 2016-07-21 DIAGNOSIS — R55 Syncope and collapse: Secondary | ICD-10-CM | POA: Diagnosis not present

## 2016-07-21 DIAGNOSIS — I4891 Unspecified atrial fibrillation: Secondary | ICD-10-CM

## 2016-07-22 NOTE — Progress Notes (Signed)
Carelink Summary Report 

## 2016-07-24 ENCOUNTER — Encounter: Payer: Self-pay | Admitting: Internal Medicine

## 2016-07-27 ENCOUNTER — Ambulatory Visit (INDEPENDENT_AMBULATORY_CARE_PROVIDER_SITE_OTHER): Payer: Medicare Other | Admitting: Pharmacist

## 2016-07-27 DIAGNOSIS — I4891 Unspecified atrial fibrillation: Secondary | ICD-10-CM | POA: Diagnosis not present

## 2016-07-27 DIAGNOSIS — Z7901 Long term (current) use of anticoagulants: Secondary | ICD-10-CM

## 2016-07-27 LAB — POCT INR: INR: 2.1

## 2016-07-27 NOTE — Patient Instructions (Signed)
Patient instructed to take medications as defined in the Anti-coagulation Track section of this encounter.  Patient instructed to take today's dose.  Patient instructed to take 1 tablet of your 2.5mg  green colored warfarin tablets by mouth daily. Patient verbalized understanding of these instructions.

## 2016-07-27 NOTE — Progress Notes (Signed)
Anti-Coagulation Progress Note  [name] is a 71 y.o. male who is currently on an anti-coagulation regimen.    RECENT RESULTS: Recent results are below, the most recent result is correlated with a dose of 15 mg. per week: Lab Results  Component Value Date   INR 2.10 07/27/2016   INR 2.30 06/15/2016   INR 3.00 05/18/2016    ANTI-COAG DOSE: Anticoagulation Dose Instructions as of 07/27/2016      [name] Tue Wed Thu Fri Sat   New Dose 2.5 mg 2.5 mg 2.5 mg 2.5 mg 2.5 mg 2.5 mg 2.5 mg       ANTICOAG SUMMARY: Anticoagulation Episode Summary    Current INR goal:   2.0-3.0  TTR:   68.3 % (3.7 y)  Next INR check:   08/17/2016  INR from last check:   2.10 (07/27/2016)  Weekly max dose:     Target end date:   Indefinite  [facility] check [facility]   [facility]  Preferred lab:     Send INR reminders to:      Indications   Atrial fibrillation ([place]) [I48.91]       Comments:           ANTICOAG TODAY: Anticoagulation Summary  As of 07/27/2016   INR goal:   2.0-3.0  TTR:     Today's INR:   2.10  Next INR check:   08/17/2016  Target end date:   Indefinite   Indications   Atrial fibrillation ([facility]) [I48.91]        Anticoagulation Episode Summary    [facility] check location[facility]   [facility]   Preferred lab:      Send INR reminders to:      Comments:         PATIENT INSTRUCTIONS: Patient instructed to take medications as defined in the Anti-coagulation Track section of this encounter.  Patient instructed to take today's dose.  Patient instructed to take 1 tablet of your 2.5mg  green colored warfarin tablets by mouth daily. Patient verbalized understanding of these instructions.     FOLLOW-UP Return in 3 weeks (on 08/17/2016) for Follow up INR at 0845h.  [name], III Pharm.D., CACP

## 2016-07-28 NOTE — Progress Notes (Signed)
INTERNAL MEDICINE TEACHING ATTENDING ADDENDUM - Lucious Groves, DO Duration- indefinate, Indication- Afib, INR-  Lab Results  Component Value Date   INR 2.10 07/27/2016  . Agree with pharmacy recommendations as outlined in their note.

## 2016-07-30 ENCOUNTER — Other Ambulatory Visit: Payer: Self-pay | Admitting: Internal Medicine

## 2016-07-30 DIAGNOSIS — I4891 Unspecified atrial fibrillation: Secondary | ICD-10-CM

## 2016-08-02 ENCOUNTER — Other Ambulatory Visit: Payer: Self-pay | Admitting: Internal Medicine

## 2016-08-02 DIAGNOSIS — D45 Polycythemia vera: Secondary | ICD-10-CM

## 2016-08-08 LAB — CUP PACEART REMOTE DEVICE CHECK
Implantable Pulse Generator Implant Date: 20150204
MDC IDC SESS DTM: 20171202123823

## 2016-08-08 NOTE — Progress Notes (Signed)
Carelink summary report received. Battery status OK. Normal device function. No new symptom episodes, tachy episodes, brady, or pause episodes. 16 AF 1.3% +warfarin.  Monthly summary reports and ROV/PRN

## 2016-08-17 ENCOUNTER — Ambulatory Visit (INDEPENDENT_AMBULATORY_CARE_PROVIDER_SITE_OTHER): Payer: Medicare Other | Admitting: Pharmacist

## 2016-08-17 DIAGNOSIS — Z7901 Long term (current) use of anticoagulants: Secondary | ICD-10-CM | POA: Diagnosis not present

## 2016-08-17 DIAGNOSIS — I4891 Unspecified atrial fibrillation: Secondary | ICD-10-CM

## 2016-08-17 LAB — POCT INR: INR: 2.2

## 2016-08-17 NOTE — Progress Notes (Signed)
Anti-Coagulation Progress Note  [name] is a 71 y.o. male who is currently on an anti-coagulation regimen.    RECENT RESULTS: Recent results are below, the most recent result is correlated with a dose of 17.5 mg. per week: Lab Results  Component Value Date   INR 2.20 08/17/2016   INR 2.10 07/27/2016   INR 2.30 06/15/2016    ANTI-COAG DOSE: Anticoagulation Dose Instructions as of 08/17/2016      [name] Tue Wed Thu Fri Sat   New Dose 2.5 mg 2.5 mg 2.5 mg 2.5 mg 2.5 mg 2.5 mg 2.5 mg    Description   Take 1 tablet by mouth once-daily.      ANTICOAG SUMMARY: Anticoagulation Episode Summary    Current INR goal:   2.0-3.0  TTR:   68.8 % (3.8 y)  Next INR check:   09/14/2016  INR from last check:   2.20 (08/17/2016)  Weekly max dose:     Target end date:   Indefinite  [facility] check location[facility]   [facility]  Preferred lab:     Send INR reminders to:      Indications   Atrial fibrillation ([place]) [I48.91]       Comments:           ANTICOAG TODAY: Anticoagulation Summary  As of 08/17/2016   INR goal:   2.0-3.0  TTR:     Today's INR:   2.20  Next INR check:   09/14/2016  Target end date:   Indefinite   Indications   Atrial fibrillation ([facility]) [I48.91]        Anticoagulation Episode Summary    [facility] check location[facility]   [facility]   Preferred lab:      Send INR reminders to:      Comments:         PATIENT INSTRUCTIONS: Patient instructed to take medications as defined in the Anti-coagulation Track section of this encounter.  Patient instructed to take today's dose.  Patient instructed to take one tablet by mouth once daily of your 2.5mg  green colored warfarin tablets.  Patient verbalized understanding of these instructions.     FOLLOW-UP Return in 4 weeks (on 09/14/2016) for Follow up INR at 0830h.  [name], III Pharm.D., CACP

## 2016-08-17 NOTE — Patient Instructions (Signed)
Patient instructed to take medications as defined in the Anti-coagulation Track section of this encounter.  Patient instructed to take today's dose.  Patient instructed to take one tablet by mouth once daily of your 2.5mg  green colored warfarin tablets.  Patient verbalized understanding of these instructions.

## 2016-08-18 ENCOUNTER — Telehealth: Payer: Self-pay | Admitting: Cardiology

## 2016-08-18 NOTE — Telephone Encounter (Signed)
Spoke w/ pt and requested that he send a manual transmission b/c his home monitor has not updated in at least 14 days.   

## 2016-08-19 ENCOUNTER — Ambulatory Visit (INDEPENDENT_AMBULATORY_CARE_PROVIDER_SITE_OTHER): Payer: Medicare Other | Admitting: *Deleted

## 2016-08-19 DIAGNOSIS — R55 Syncope and collapse: Secondary | ICD-10-CM

## 2016-08-19 NOTE — Progress Notes (Signed)
Carelink Summary Report / Loop Recorder 

## 2016-08-31 ENCOUNTER — Emergency Department (HOSPITAL_COMMUNITY): Payer: Medicare Other

## 2016-08-31 ENCOUNTER — Emergency Department (HOSPITAL_COMMUNITY)
Admission: EM | Admit: 2016-08-31 | Discharge: 2016-08-31 | Disposition: A | Discharge status: Home / Self Care | Payer: Medicare Other | Attending: Emergency Medicine | Admitting: Emergency Medicine

## 2016-08-31 ENCOUNTER — Encounter (HOSPITAL_COMMUNITY): Payer: Self-pay | Admitting: *Deleted

## 2016-08-31 ENCOUNTER — Emergency Department (HOSPITAL_BASED_OUTPATIENT_CLINIC_OR_DEPARTMENT_OTHER)
Admit: 2016-08-31 | Discharge: 2016-08-31 | Disposition: A | Discharge status: Home / Self Care | Payer: Medicare Other | Attending: Emergency Medicine | Admitting: Emergency Medicine

## 2016-08-31 DIAGNOSIS — L03115 Cellulitis of right lower limb: Secondary | ICD-10-CM

## 2016-08-31 DIAGNOSIS — Z859 Personal history of malignant neoplasm, unspecified: Secondary | ICD-10-CM | POA: Diagnosis not present

## 2016-08-31 DIAGNOSIS — Z79899 Other long term (current) drug therapy: Secondary | ICD-10-CM | POA: Diagnosis not present

## 2016-08-31 DIAGNOSIS — M25571 Pain in right ankle and joints of right foot: Secondary | ICD-10-CM | POA: Diagnosis present

## 2016-08-31 DIAGNOSIS — F17210 Nicotine dependence, cigarettes, uncomplicated: Secondary | ICD-10-CM | POA: Diagnosis not present

## 2016-08-31 DIAGNOSIS — I509 Heart failure, unspecified: Secondary | ICD-10-CM | POA: Insufficient documentation

## 2016-08-31 DIAGNOSIS — M79609 Pain in unspecified limb: Secondary | ICD-10-CM | POA: Diagnosis not present

## 2016-08-31 DIAGNOSIS — M7989 Other specified soft tissue disorders: Secondary | ICD-10-CM

## 2016-08-31 DIAGNOSIS — J449 Chronic obstructive pulmonary disease, unspecified: Secondary | ICD-10-CM | POA: Insufficient documentation

## 2016-08-31 DIAGNOSIS — I110 Hypertensive heart disease with heart failure: Secondary | ICD-10-CM | POA: Diagnosis not present

## 2016-08-31 LAB — CUP PACEART REMOTE DEVICE CHECK
Implantable Pulse Generator Implant Date: 20150204
MDC IDC SESS DTM: 20180101131028

## 2016-08-31 MED ORDER — CLINDAMYCIN HCL 300 MG PO CAPS: 300.0000 mg | ORAL_CAPSULE | Freq: Three times a day (TID) | ORAL | 0 refills | Status: DC

## 2016-08-31 NOTE — Progress Notes (Signed)
VASCULAR LAB PRELIMINARY  PRELIMINARY  PRELIMINARY  PRELIMINARY  Right lower extremity venous duplex completed.    Preliminary report:  There is no DVT or SVT noted in the right lower extremity.  Called report to Glendell Docker, NP  Mauro Kaufmann, [facility], RVT 08/31/2016, 2:07 PM

## 2016-08-31 NOTE — ED Triage Notes (Signed)
Pt states he woke up yesterday and was experiencing R lat ankle pain.  Also c/o R shoulder and arm pain that increases with movement.  Foot warm.  Swelling noted.

## 2016-08-31 NOTE — ED Notes (Signed)
Patient transported to X-ray 

## 2016-08-31 NOTE — ED Provider Notes (Signed)
[facility] DEPT Provider Note   CSN: [id number] Arrival date & time: 08/31/16  1114  By signing my name below, I, [name], attest that this documentation has been prepared under the direction and in the presence of Glendell Docker, NP . Electronically Signed: Higinio Hamilton, Scribe. 08/31/2016. 11:48 AM.  History   Chief Complaint Chief Complaint  Patient presents with  . Ankle Pain  . Shoulder Pain   The history is provided by the patient. No language interpreter was used.   HPI Comments: [name] is a 71 y.o. male with PMHx of A-Fib, cancer, HTN, and substance abuse, who presents to the Emergency Department complaining of sudden onset, gradually worsening, right ankle pain that began yesterday morning. Pt reports associated swelling and warmth to his foot and states his pain is exacerbated with ambulation. He states he wanted to visit the ED yesterday for his pain but "couldn't because he could not walk." Pt also complains of gradually worsening, right shoulder and arm pain that began "months ago." He notes his pain is exacerbated when lifting his arm. He denies any recent injury to trauma to his extremities and known hx of gout or PE.   Past Medical History:  Diagnosis Date  . Atrial fibrillation /flutter    hx of flutter ablation 2/12  . Cancer ([place])   . Cardiomyopathy    EF55% 11/14<<35%   . CHF (congestive heart failure) ([place])   . COPD (chronic obstructive pulmonary disease) ([place])   . GERD (gastroesophageal reflux disease)   . Hepatitis    "think I had that once; a long time ago; from dirty needles I think" (12/14/2012)  . Hypertension   . Leukocytosis, unspecified 06/12/2013  . 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 Obesity   . Pneumonia   . Primary polycythemia ([place]) 06/12/2013  . Renal insufficiency   . Shortness of breath    "all the time lately" (12/14/2012)  . Substance abuse    alcohol  . Syncope     Patient Active Problem List   Diagnosis Date Noted  . Preventive measure 05/25/2016  . Hepatitis C antibody test positive   . Hematuria 11/09/2014  . Leukopenia due to antineoplastic chemotherapy ([place]) 08/24/2014  . Thrombocytopenia due to drugs 07/25/2014  . Encounter for screening colonoscopy 07/02/2014  . Chronic anticoagulation 07/02/2014  . Prolonged QT interval 06/05/2014  . Sinus bradycardia 06/05/2014  . Opioid dependence ([place]) 06/05/2014  . Diastolic dysfunction [id number]  . COPD (chronic obstructive pulmonary disease) ([place]) 05/30/2014  . Health care maintenance 05/24/2014  . Anemia due to antineoplastic chemotherapy 04/24/2014  . Thyroid mass 08/26/2013  . Syncope 08/22/2013  . 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 Polycythemia vera ([place]) 01/03/2013  . Malnutrition of moderate degree ([place]) 12/15/2012  . Tobacco abuse 09/15/2012  . Idiopathic cardiomyopathy ([place])   . Alcohol abuse 09/30/2010  . Hypertension 09/30/2010  . Atrial fibrillation [facility])     Past Surgical History:  Procedure Laterality Date  . CARDIAC ELECTROPHYSIOLOGY MAPPING AND ABLATION  08/2010   [name] 09/07/2010 (12/14/2012)  . COLONOSCOPY     15-20 years ago had colon in [place]  1970's  . LOOP RECORDER IMPLANT  08-22-2013   MDT LinQ implanted by Dr Caryl Comes for syncope  . LOOP RECORDER IMPLANT N/A 08/23/2013   Procedure: LOOP RECORDER IMPLANT;  Surgeon: Deboraha Sprang, MD;  Location: [facility] CATH LAB;  Service: Cardiovascular;  Laterality: N/A;  . MULTIPLE EXTRACTIONS WITH ALVEOLOPLASTY Bilateral 01/24/2016  Procedure: MULTIPLE EXTRACTION WITH ALVEOLOPLASTY BILATERAL;  Surgeon: Diona Browner, DDS;  Location: [facility];  Service: Oral Surgery;  Laterality: Bilateral;  . MULTIPLE TOOTH EXTRACTIONS  01/24/2016   MULTIPLE EXTRACTION WITH ALVEOLOPLASTY BILATERAL (Bilateral)    Home Medications    Prior to Admission medications   Medication Sig Start Date End Date Taking? Authorizing Provider  albuterol (PROAIR HFA) 108  (90 Base) MCG/ACT inhaler Inhale 1-2 puffs into the lungs every 6 (six) hours as needed for wheezing or shortness of breath. PLACE ON HOLD UNTIL PATIENT REQUESTS TO FILL Patient taking differently: Inhale 1-2 puffs into the lungs every 6 (six) hours as needed for wheezing or shortness of breath.  09/27/15   Juluis Mire, MD  amiodarone (PACERONE) 200 MG tablet take 1 tablet by mouth once daily 07/22/16   Jule Ser, DO  carvedilol (COREG) 12.5 MG tablet Take 1 tablet (12.5 mg total) by mouth every morning. 02/11/16   Jule Ser, DO  fluticasone (FLOVENT HFA) 44 MCG/ACT inhaler Inhale 2 puffs into the lungs 2 (two) times daily.    Historical Provider, MD  folic acid (FOLVITE) 1 MG tablet take 1 tablet by mouth once daily 07/15/16   Jule Ser, DO  furosemide (LASIX) 40 MG tablet Take 1 tablet (40 mg total) by mouth daily. 09/27/15   Norval Gable, MD  hydroxyurea (HYDREA) 500 MG capsule take 1 capsule by mouth ON MONDAY/WEDNESDAY/FRIDAY, AND TAKE 2 CAPS ALL OTHER DAYS 08/04/16   Jule Ser, DO  SUBOXONE 8-2 MG FILM Place 1 Film under the tongue daily.  08/21/15   Historical Provider, MD  tiotropium (SPIRIVA HANDIHALER) 18 MCG inhalation capsule Place 1 capsule (18 mcg total) into inhaler and inhale daily. PLACE ON HOLD UNTIL PATIENT REQUESTS TO FILL 09/27/15   Juluis Mire, MD  warfarin (COUMADIN) 2.5 MG tablet Take 1 tablet (2.5 mg total) by mouth daily. 07/31/16   Jule Ser, DO    Family History Family History  Problem Relation Age of Onset  . Diabetes Mother   . Hypertension Mother   . Cirrhosis Father   . Alcohol abuse Father   . Colon cancer Neg Hx   . Rectal cancer Neg Hx   . Stomach cancer Neg Hx     Social History Social History  Substance Use Topics  . Smoking status: Current Every Day Smoker    Packs/day: 0.20    Years: 40.00    Types: Cigarettes  . Smokeless tobacco: Never Used     Comment: patient smoked 1-2 cigs x 50 years, now smoking approx 4-5 cigs  per day x 1 year  . Alcohol use 0.0 oz/week     Comment: beer occasionally   Allergies   Patient has no known allergies.  Review of Systems Review of Systems  Constitutional: Negative for fever.  Musculoskeletal: Positive for arthralgias and joint swelling.   Physical Exam Updated Vital Signs BP 114/80 (BP Location: Right Arm)   Pulse 67   Temp 99.2 F (37.3 C) (Oral)   Resp 20   Ht 5\' 6"  (1.676 m)   Wt 215 lb (97.5 kg)   SpO2 94%   BMI 34.70 kg/m   Physical Exam  Constitutional: He is oriented to person, place, and time. He appears well-developed and well-nourished.  HENT:  Head: Normocephalic.  Eyes: EOM are normal.  Neck: Normal range of motion.  Pulmonary/Chest: Effort normal.  Abdominal: He exhibits no distension.  Musculoskeletal: Normal range of motion.  Swelling noted to there right foot  and ankle. Mild warmth noted. mild redness noted to medial ankles. Pulses intact. Full rom  Neurological: He is alert and oriented to person, place, and time.  Psychiatric: He has a normal mood and affect.  Nursing note and vitals reviewed.  ED Treatments / Results  DIAGNOSTIC STUDIES:  Oxygen Saturation is 94% on RA, normal by my interpretation.    COORDINATION OF CARE:  11:46 AM Discussed treatment Hamilton with pt at bedside and pt agreed to Hamilton.  Labs (all labs ordered are listed, but only abnormal results are displayed) Labs Reviewed - No data to display  EKG  EKG Interpretation None       Radiology No results found.  Procedures Procedures (including critical care time)  Medications Ordered in ED Medications - No data to display  Initial Impression / Assessment and Hamilton / ED Course  I have reviewed the triage vital signs and the nursing notes.  Pertinent labs & imaging results that were available during my care of the patient were reviewed by me and considered in my medical decision making (see chart for details).     No sign of dvt or fracture.  With mild redness and swelling. Will treat for possible cellulitis. Discussed return precautions with pt I personally performed the services described in this documentation, which was scribed in my presence. The recorded information has been reviewed and is accurate.   Final Clinical Impressions(s) / ED Diagnoses   Final diagnoses:  None    New Prescriptions New Prescriptions   No medications on file     Glendell Docker, NP 08/31/16 [place], MD 08/31/16 1627

## 2016-08-31 NOTE — ED Notes (Signed)
Patient transported to Ultrasound 

## 2016-09-17 LAB — CUP PACEART REMOTE DEVICE CHECK
Implantable Pulse Generator Implant Date: 20150204
MDC IDC SESS DTM: 20180131130929

## 2016-09-18 ENCOUNTER — Other Ambulatory Visit: Payer: Self-pay | Admitting: Internal Medicine

## 2016-09-18 ENCOUNTER — Ambulatory Visit (INDEPENDENT_AMBULATORY_CARE_PROVIDER_SITE_OTHER): Payer: Medicare Other | Admitting: *Deleted

## 2016-09-18 DIAGNOSIS — R55 Syncope and collapse: Secondary | ICD-10-CM | POA: Diagnosis not present

## 2016-09-21 ENCOUNTER — Ambulatory Visit (INDEPENDENT_AMBULATORY_CARE_PROVIDER_SITE_OTHER): Payer: Medicare Other | Admitting: Pharmacist

## 2016-09-21 DIAGNOSIS — Z7901 Long term (current) use of anticoagulants: Secondary | ICD-10-CM | POA: Diagnosis not present

## 2016-09-21 DIAGNOSIS — I4891 Unspecified atrial fibrillation: Secondary | ICD-10-CM | POA: Diagnosis present

## 2016-09-21 LAB — POCT INR: INR: 3

## 2016-09-21 NOTE — Patient Instructions (Signed)
Patient instructed to take medications as defined in the Anti-coagulation Track section of this encounter.  Patient instructed to take today's dose.  Patient instructed to take 1/2 tablet on Mondays; all other days--take 1 tablet of your 2.5mg  green colored warfarin tablets.  Patient verbalized understanding of these instructions.

## 2016-09-21 NOTE — Progress Notes (Signed)
Anti-Coagulation Progress Note  [name] is a 71 y.o. male who is currently on an anti-coagulation regimen.    RECENT RESULTS: Recent results are below, the most recent result is correlated with a dose of 17.5 mg. per week: Lab Results  Component Value Date   INR 3.0 09/21/2016   INR 2.20 08/17/2016   INR 2.10 07/27/2016    ANTI-COAG DOSE: Anticoagulation Dose Instructions as of 09/21/2016      [name] Tue Wed Thu Fri Sat   New Dose 2.5 mg 1.25 mg 2.5 mg 2.5 mg 2.5 mg 2.5 mg 2.5 mg    Description   Take 1 tablet by mouth once-daily--EXCEPT on Mondays, take only 1/2 tablet on MONDAYS.       ANTICOAG SUMMARY: Anticoagulation Episode Summary    Current INR goal:   2.0-3.0  TTR:   69.6 % (3.9 y)  Next INR check:   10/19/2016  INR from last check:   3.0 (09/21/2016)  Weekly max dose:     Target end date:   Indefinite  [facility]   [facility]  Preferred lab:     Send INR reminders to:      Indications   Atrial fibrillation ([place]) [I48.91]       Comments:           ANTICOAG TODAY: Anticoagulation Summary  As of 09/21/2016   INR goal:   2.0-3.0  TTR:     Today's INR:   3.0  Next INR check:   10/19/2016  Target end date:   Indefinite   Indications   Atrial fibrillation ([facility]) [I48.91]        Anticoagulation Episode Summary    [facility] check location[facility]   [facility]   Preferred lab:      Send INR reminders to:      Comments:         PATIENT INSTRUCTIONS: Patient instructed to take medications as defined in the Anti-coagulation Track section of this encounter.  Patient instructed to take today's dose.  Patient instructed to take 1/2 tablet on Mondays; all other days--take 1 tablet of your 2.5mg  green colored warfarin tablets.  Patient verbalized understanding of these instructions.  FOLLOW-UP Return in 4 weeks (on 10/19/2016) for Follow up INR at 0845h.  [name], III Pharm.D., CACP

## 2016-09-21 NOTE — Progress Notes (Signed)
Carelink Summary Report / Loop Recorder 

## 2016-09-21 NOTE — Progress Notes (Signed)
Indication: Atrial fibrillation. Duration: Indefinite. INR: At target. Agree with Dr. Groce's assessment and plan. 

## 2016-10-07 LAB — CUP PACEART REMOTE DEVICE CHECK
Date Time Interrogation Session: 20180302131451
Implantable Pulse Generator Implant Date: 20150204

## 2016-10-19 ENCOUNTER — Ambulatory Visit (INDEPENDENT_AMBULATORY_CARE_PROVIDER_SITE_OTHER): Payer: Medicare Other | Admitting: Pharmacist

## 2016-10-19 ENCOUNTER — Ambulatory Visit (INDEPENDENT_AMBULATORY_CARE_PROVIDER_SITE_OTHER): Payer: Medicare Other | Admitting: *Deleted

## 2016-10-19 DIAGNOSIS — I4891 Unspecified atrial fibrillation: Secondary | ICD-10-CM

## 2016-10-19 DIAGNOSIS — Z7901 Long term (current) use of anticoagulants: Secondary | ICD-10-CM

## 2016-10-19 DIAGNOSIS — R55 Syncope and collapse: Secondary | ICD-10-CM

## 2016-10-19 LAB — POCT INR: INR: 3.6

## 2016-10-19 NOTE — Patient Instructions (Signed)
Patient instructed to take medications as defined in the Anti-coagulation Track section of this encounter.  Patient instructed to today's dose. (Patient has already taken today's dose).  Patient instructed to take 1/2 tablet on Mondays, Tuesdays and Thursdays; all other days, take 1 tablet.  Patient verbalized understanding of these instructions.

## 2016-10-19 NOTE — Progress Notes (Signed)
Carelink Summary Report / Loop Recorder 

## 2016-10-19 NOTE — Progress Notes (Signed)
INTERNAL MEDICINE TEACHING ATTENDING ADDENDUM - Ryan Groves, DO Duration- indefinate, Indication- A fib, INR-  Lab Results  Component Value Date   INR 3.60 10/19/2016  . Agree with pharmacy recommendations as outlined in their note.

## 2016-10-19 NOTE — Progress Notes (Signed)
Anti-Coagulation Progress Note  [name] is a 71 y.o. male who is currently on an anti-coagulation regimen.    RECENT RESULTS: Recent results are below, the most recent result is correlated with a dose of 16.25 mg. per week: Lab Results  Component Value Date   INR 3.60 10/19/2016   INR 3.0 09/21/2016   INR 2.20 08/17/2016    ANTI-COAG DOSE: Anticoagulation Dose Instructions as of 10/19/2016      [name] Tue Wed Thu Fri Sat   New Dose 2.5 mg 1.25 mg 1.25 mg 2.5 mg 1.25 mg 2.5 mg 2.5 mg    Description   Take 1/2 tablet on Mondays, Tuesdays, and Thursdays; all other days--take 1 tablet.       ANTICOAG SUMMARY: Anticoagulation Episode Summary    Current INR goal:   2.0-3.0  TTR:   68.2 % (3.9 y)  Next INR check:   11/02/2016  INR from last check:   3.60! (10/19/2016)  Weekly max dose:     Target end date:   Indefinite  [facility]  Preferred lab:     Send INR reminders to:      Indications   Atrial fibrillation ([place]) [I48.91]       Comments:           ANTICOAG TODAY: Anticoagulation Summary  As of 10/19/2016   INR goal:   2.0-3.0  TTR:     Today's INR:   3.60!  Next INR check:   11/02/2016  Target end date:   Indefinite   Indications   Atrial fibrillation ([facility]) [I48.91]        Anticoagulation Episode Summary    [facility] check [facility]   [facility]   Preferred lab:      Send INR reminders to:      Comments:         PATIENT INSTRUCTIONS: Patient Instructions  Patient instructed to take medications as defined in the Anti-coagulation Track section of this encounter.  Patient instructed to today's dose. (Patient has already taken today's dose).  Patient instructed to take 1/2 tablet on Mondays, Tuesdays and Thursdays; all other days, take 1 tablet.  Patient verbalized understanding of these instructions.       FOLLOW-UP Return in 2 weeks (on 11/02/2016) for Follow up INR at 0845h.  [name], III Pharm.D.,  CACP

## 2016-10-22 LAB — CUP PACEART REMOTE DEVICE CHECK
Implantable Pulse Generator Implant Date: 20150204
MDC IDC SESS DTM: 20180401133917

## 2016-11-02 ENCOUNTER — Ambulatory Visit (INDEPENDENT_AMBULATORY_CARE_PROVIDER_SITE_OTHER): Payer: Medicare Other | Admitting: Internal Medicine

## 2016-11-02 ENCOUNTER — Telehealth: Payer: Self-pay

## 2016-11-02 ENCOUNTER — Ambulatory Visit (INDEPENDENT_AMBULATORY_CARE_PROVIDER_SITE_OTHER): Payer: Medicare Other | Admitting: Pharmacist

## 2016-11-02 ENCOUNTER — Encounter: Payer: Self-pay | Admitting: Internal Medicine

## 2016-11-02 DIAGNOSIS — Z7901 Long term (current) use of anticoagulants: Secondary | ICD-10-CM | POA: Diagnosis not present

## 2016-11-02 DIAGNOSIS — F17210 Nicotine dependence, cigarettes, uncomplicated: Secondary | ICD-10-CM

## 2016-11-02 DIAGNOSIS — I4891 Unspecified atrial fibrillation: Secondary | ICD-10-CM

## 2016-11-02 DIAGNOSIS — M109 Gout, unspecified: Secondary | ICD-10-CM | POA: Insufficient documentation

## 2016-11-02 DIAGNOSIS — M79672 Pain in left foot: Secondary | ICD-10-CM

## 2016-11-02 LAB — POCT INR: INR: 1.4

## 2016-11-02 MED ORDER — COLCHICINE 0.6 MG PO TABS: ORAL_TABLET | ORAL | 0 refills | Status: DC

## 2016-11-02 NOTE — Telephone Encounter (Signed)
Called to [facility]

## 2016-11-02 NOTE — Progress Notes (Signed)
Anti-Coagulation Progress Note  [name] is a 71 y.o. male who is currently on an anti-coagulation regimen.    RECENT RESULTS: Recent results are below, the most recent result is correlated with a dose of 13.75 mg. per week: Lab Results  Component Value Date   INR 1.40 11/02/2016   INR 3.60 10/19/2016   INR 3.0 09/21/2016    ANTI-COAG DOSE: Anticoagulation Dose Instructions as of 11/02/2016      [name] Tue Wed Thu Fri Sat   New Dose 2.5 mg 1.25 mg 2.5 mg 2.5 mg 1.25 mg 2.5 mg 2.5 mg    Description   Take 1/2 tablet on Mondays and Thursdays; all other days--take 1 tablet.       ANTICOAG SUMMARY: Anticoagulation Episode Summary    Current INR goal:   2.0-3.0  TTR:   68.0 % (4 y)  Next INR check:   11/16/2016  INR from last check:   1.40! (11/02/2016)  Weekly max dose:     Target end date:   Indefinite  [facility]  Preferred lab:     Send INR reminders to:      Indications   Atrial fibrillation ([place]) [I48.91]       Comments:           ANTICOAG TODAY: Anticoagulation Summary  As of 11/02/2016   INR goal:   2.0-3.0  TTR:     Today's INR:   1.40!  Next INR check:   11/16/2016  Target end date:   Indefinite   Indications   Atrial fibrillation ([facility]) [I48.91]        Anticoagulation Episode Summary    [facility] check [facility]   Preferred lab:      Send INR reminders to:      Comments:         PATIENT INSTRUCTIONS: Patient Instructions  Patient instructed to take medications as defined in the Anti-coagulation Track section of this encounter.  Patient instructed to take today's dose.  Patient instructed to take 1/2 tablet of your 5mg  peach-colored warfarin table on Mondays and Thursdays. All other days of the week--take one (1) tablet of your 5mg  peach-colored warfarin tablets.  Patient verbalized understanding of these instructions.       FOLLOW-UP Return in 2 weeks (on 11/16/2016) for Follow up INR at  0900h.  [name], III Pharm.D., CACP

## 2016-11-02 NOTE — Progress Notes (Signed)
   CC: Swollen foot  HPI:  [name] is a 71 y.o. male with a past medical history listed below here today with complaints of a swollen foot.   For details of today's visit and the status of his chronic medical issues please refer to the assessment and plan.    Past Medical History:  Diagnosis Date  . Atrial fibrillation /flutter    hx of flutter ablation 2/12  . Cancer ([place])   . Cardiomyopathy    EF55% 11/14<<35%   . CHF (congestive heart failure) ([place])   . COPD (chronic obstructive pulmonary disease) ([place])   . GERD (gastroesophageal reflux disease)   . Hepatitis    "think I had that once; a long time ago; from dirty needles I think" (12/14/2012)  . Hypertension   . Leukocytosis, unspecified 06/12/2013  . 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 Obesity   . Pneumonia   . Primary polycythemia ([place]) 06/12/2013  . Renal insufficiency   . Shortness of breath    "all the time lately" (12/14/2012)  . Substance abuse    alcohol  . Syncope     Review of Systems:   See HPI  Physical Exam:  Vitals:   11/02/16 0901  BP: (!) 141/61  Pulse: (!) 55  Temp: 98.7 F (37.1 C)  TempSrc: Oral  SpO2: 97%  Weight: 226 lb 1.6 oz (102.6 kg)  Height: 5\' 6"  (1.676 m)   Physical Exam  Constitutional: He is well-developed, well-nourished, and in no distress.  Cardiovascular: Normal rate and regular rhythm.   Pulmonary/Chest: Effort normal and breath sounds normal.  Musculoskeletal:       Left foot: There is tenderness and swelling.  No erythema over left foot. Trace edema present. Mild warmth over dorsum of foot. Very tender to palpation over right great toe. Normal ROM but limited due to pain.   Vitals reviewed.    Assessment & Plan:   See Encounters Tab for problem based charting.  Patient discussed with Dr. Lynnae January

## 2016-11-02 NOTE — Assessment & Plan Note (Signed)
  Patient was seen in the ED on 08/31/2016 with complaints of sudden onset, worsening right ankle pain with associated swelling and warmth in his foot. Reported at that time he had difficulty walking due to the pain. XR of his right foot and ankle done at that time showed no evidence of bony abnormality. Did show diffuse soft tissue swelling in his right ankle. He was treated for possible cellulitis with a course of clindamycin.   Today, he reports pain and swelling in his left foot for the past week. Pain in his entire foot but worst in his great toe. Describes it as a sharp stabbing pain. Exquisitely tender to palpation. No known history of gout. Reports this feels different that the pain in his right foot in February. Says this pain is much worse and he can't walk on it and he could walk on his right foot back then (though EDP note indicates he stated he could not walk on his right foot at that time). Denies any fevers or chills. Uric acid from 05/2013 was elevated at 8.0.   Assessment: Left foot pain likely 2/2 gout flare  Plan: No evidence of infectious process today. Will give empiric Rx for colchicine. RTC in 1-2 weeks for follow up. If symptoms improve, will need continue prophylaxis. Will check Uric acid at follow up visit after acute flare is resolved.

## 2016-11-02 NOTE — Progress Notes (Signed)
Internal Medicine Clinic Attending  Case discussed with Dr. Boswell at the time of the visit.  We reviewed the resident's history and exam and pertinent patient test results.  I agree with the assessment, diagnosis, and plan of care documented in the resident's note.  

## 2016-11-02 NOTE — Patient Instructions (Signed)
Patient instructed to take medications as defined in the Anti-coagulation Track section of this encounter.  Patient instructed to take today's dose.  Patient instructed to take 1/2 tablet of your 5mg  peach-colored warfarin table on Mondays and Thursdays. All other days of the week--take one (1) tablet of your 5mg  peach-colored warfarin tablets.  Patient verbalized understanding of these instructions.

## 2016-11-02 NOTE — Patient Instructions (Signed)
[name],  Your foot pain is likely from gout where crystals form in your joint. I am going to give you a prescription for a medicine called Colchicine. Please take 2 tablets initially followed by one tablet an hour later if you are still having symptoms. Then take 1 tablet daily after that. You can also use NSAIDs like ibuprofen as needed for the pain.  Please follow up in 1-2 weeks for recheck.    Gout Gout is painful swelling that can happen in some of your joints. Gout is a type of arthritis. This condition is caused by having too much uric acid in your body. Uric acid is a chemical that is made when your body breaks down substances called purines. If your body has too much uric acid, sharp crystals can form and build up in your joints. This causes pain and swelling. Gout attacks can happen quickly and be very painful (acute gout). Over time, the attacks can affect more joints and happen more often (chronic gout). Follow these instructions at home: During a Gout Attack   If directed, put ice on the painful area:  Put ice in a plastic bag.  Place a towel between your skin and the bag.  Leave the ice on for 20 minutes, 2-3 times a day.  Rest the joint as much as possible. If the joint is in your leg, you may be given crutches to use.  Raise (elevate) the painful joint above the level of your heart as often as you can.  Drink enough fluids to keep your pee (urine) clear or pale yellow.  Take over-the-counter and prescription medicines only as told by your doctor.  Do not drive or use heavy machinery while taking prescription pain medicine.  Follow instructions from your doctor about what you can or cannot eat and drink.  Return to your normal activities as told by your doctor. Ask your doctor what activities are safe for you. Avoiding Future Gout Attacks   Follow a low-purine diet as told by a specialist (dietitian) or your doctor. Avoid foods and drinks that have a  lot of purines, such as:  Liver.  Kidney.  Anchovies.  Asparagus.  Herring.  Mushrooms  Mussels.  Beer.  Limit alcohol intake to no more than 1 drink a day for nonpregnant women and 2 drinks a day for men. One drink equals 12 oz of beer, 5 oz of wine, or 1 oz of hard liquor.  Stay at a healthy weight or lose weight if you are overweight. If you want to lose weight, talk with your doctor. It is important that you do not lose weight too fast.  Start or continue an exercise plan as told by your doctor.  Drink enough fluids to keep your pee clear or pale yellow.  Take over-the-counter and prescription medicines only as told by your doctor.  Keep all follow-up visits as told by your doctor. This is important. Contact a doctor if:  You have another gout attack.  You still have symptoms of a gout attack after10 days of treatment.  You have problems (side effects) because of your medicines.  You have chills or a fever.  You have burning pain when you pee (urinate).  You have pain in your lower back or belly. Get help right away if:  You have very bad pain.  Your pain cannot be controlled.  You cannot pee. This information is not intended to replace advice given to you by your health care  provider. Make sure you discuss any questions you have with your health care provider. Document Released: 04/14/2008 Document Revised: 12/12/2015 Document Reviewed: 04/18/2015 Elsevier Interactive Patient Education  2017 [employer].

## 2016-11-02 NOTE — Telephone Encounter (Signed)
    colchicine 0.6 MG tablet, is not at the pharmacy. Pt is using [facility] on [address].

## 2016-11-06 ENCOUNTER — Telehealth: Payer: Self-pay | Admitting: *Deleted

## 2016-11-06 NOTE — Telephone Encounter (Signed)
Spoke with patient regarding LINQ at [facility] since October 26, 2016. Advised patient about the next steps to schedule an appointment with Dr. Lovena Le to discuss explant or leaving the LINQ in place. Patient declines explant at this time. Advised patient a return kit would be sent to his house for his home [employer] monitor. Patient verbalized understanding.

## 2016-11-16 ENCOUNTER — Ambulatory Visit (INDEPENDENT_AMBULATORY_CARE_PROVIDER_SITE_OTHER): Payer: Medicare Other | Admitting: Pharmacist

## 2016-11-16 DIAGNOSIS — I4891 Unspecified atrial fibrillation: Secondary | ICD-10-CM

## 2016-11-16 DIAGNOSIS — Z7901 Long term (current) use of anticoagulants: Secondary | ICD-10-CM

## 2016-11-16 LAB — POCT INR: INR: 2.9

## 2016-11-16 NOTE — Patient Instructions (Signed)
Patient instructed to take medications as defined in the Anti-coagulation Track section of this encounter.  Patient instructed to take today's dose.  Patient instructed to take 1/2 tablet on Mondays, Wednesdays and Fridays;  all other days--take 1 tablet. Patient verbalized understanding of these instructions.

## 2016-11-16 NOTE — Progress Notes (Signed)
Anti-Coagulation Progress Note  [name] is a 71 y.o. male who is currently on an anti-coagulation regimen.    RECENT RESULTS: Recent results are below, the most recent result is correlated with a dose of 15 mg. per week: Lab Results  Component Value Date   INR 2.90 11/16/2016   INR 1.40 11/02/2016   INR 3.60 10/19/2016    ANTI-COAG DOSE: Anticoagulation Dose Instructions as of 11/16/2016      [name] Tue Wed Thu Fri Sat   New Dose 2.5 mg 1.25 mg 2.5 mg 1.25 mg 2.5 mg 1.25 mg 2.5 mg    Description   Take 1/2 tablet on Mondays, Wednesdays and Fridays;  all other days--take 1 tablet.       ANTICOAG SUMMARY: Anticoagulation Episode Summary    Current INR goal:   2.0-3.0  TTR:   67.9 % (4 y)  Next INR check:   11/16/2016  INR from last check:   2.90 (11/16/2016)  Weekly max dose:     Target end date:   Indefinite  [facility] check [facility]   [facility]  Preferred lab:     Send INR reminders to:      Indications   Atrial fibrillation ([place]) [I48.91]       Comments:           ANTICOAG TODAY: Anticoagulation Summary  As of 11/16/2016   INR goal:   2.0-3.0  TTR:     Today's INR:   2.90  Next INR check:     Target end date:   Indefinite   Indications   Atrial fibrillation ([facility]) [I48.91]        Anticoagulation Episode Summary    [facility] check [facility]   [facility]   Preferred lab:      Send INR reminders to:      Comments:         PATIENT INSTRUCTIONS: Patient Instructions  Patient instructed to take medications as defined in the Anti-coagulation Track section of this encounter.  Patient instructed to take today's dose.  Patient instructed to take 1/2 tablet on Mondays, Wednesdays and Fridays;  all other days--take 1 tablet. Patient verbalized understanding of these instructions.       FOLLOW-UP Return in 5 weeks (on 12/21/2016) for Follow up INR at 0845h.  [name], III Pharm.D., CACP

## 2016-11-17 ENCOUNTER — Encounter: Payer: Medicare Other | Admitting: *Deleted

## 2016-11-23 ENCOUNTER — Other Ambulatory Visit: Payer: Self-pay | Admitting: Internal Medicine

## 2016-11-25 ENCOUNTER — Encounter: Payer: Self-pay | Admitting: Internal Medicine

## 2016-11-25 DIAGNOSIS — D369 Benign neoplasm, unspecified site: Secondary | ICD-10-CM | POA: Insufficient documentation

## 2016-11-26 ENCOUNTER — Encounter: Payer: Self-pay | Admitting: Internal Medicine

## 2016-11-26 ENCOUNTER — Ambulatory Visit (HOSPITAL_BASED_OUTPATIENT_CLINIC_OR_DEPARTMENT_OTHER): Payer: Medicare Other | Admitting: Hematology and Oncology

## 2016-11-26 ENCOUNTER — Other Ambulatory Visit (HOSPITAL_BASED_OUTPATIENT_CLINIC_OR_DEPARTMENT_OTHER): Payer: Medicare Other

## 2016-11-26 ENCOUNTER — Telehealth: Payer: Self-pay | Admitting: Hematology and Oncology

## 2016-11-26 ENCOUNTER — Ambulatory Visit (INDEPENDENT_AMBULATORY_CARE_PROVIDER_SITE_OTHER): Payer: Medicare Other | Admitting: Internal Medicine

## 2016-11-26 VITALS — BP 126/63 | HR 54 | Temp 98.2°F | Ht 66.0 in | Wt 222.9 lb

## 2016-11-26 DIAGNOSIS — D45 Polycythemia vera: Secondary | ICD-10-CM | POA: Diagnosis present

## 2016-11-26 DIAGNOSIS — I5032 Chronic diastolic (congestive) heart failure: Secondary | ICD-10-CM | POA: Diagnosis not present

## 2016-11-26 DIAGNOSIS — D6959 Other secondary thrombocytopenia: Secondary | ICD-10-CM | POA: Diagnosis not present

## 2016-11-26 DIAGNOSIS — Z7901 Long term (current) use of anticoagulants: Secondary | ICD-10-CM | POA: Diagnosis not present

## 2016-11-26 DIAGNOSIS — M109 Gout, unspecified: Secondary | ICD-10-CM | POA: Diagnosis not present

## 2016-11-26 DIAGNOSIS — M79672 Pain in left foot: Secondary | ICD-10-CM

## 2016-11-26 DIAGNOSIS — I5030 Unspecified diastolic (congestive) heart failure: Secondary | ICD-10-CM

## 2016-11-26 DIAGNOSIS — Z7951 Long term (current) use of inhaled steroids: Secondary | ICD-10-CM | POA: Diagnosis not present

## 2016-11-26 DIAGNOSIS — Z720 Tobacco use: Secondary | ICD-10-CM | POA: Diagnosis not present

## 2016-11-26 DIAGNOSIS — J449 Chronic obstructive pulmonary disease, unspecified: Secondary | ICD-10-CM

## 2016-11-26 DIAGNOSIS — D61818 Other pancytopenia: Secondary | ICD-10-CM

## 2016-11-26 DIAGNOSIS — T50905A Adverse effect of unspecified drugs, medicaments and biological substances, initial encounter: Secondary | ICD-10-CM

## 2016-11-26 DIAGNOSIS — J440 Chronic obstructive pulmonary disease with acute lower respiratory infection: Secondary | ICD-10-CM

## 2016-11-26 DIAGNOSIS — I10 Essential (primary) hypertension: Secondary | ICD-10-CM

## 2016-11-26 DIAGNOSIS — F17210 Nicotine dependence, cigarettes, uncomplicated: Secondary | ICD-10-CM

## 2016-11-26 DIAGNOSIS — I4891 Unspecified atrial fibrillation: Secondary | ICD-10-CM

## 2016-11-26 LAB — CBC WITH DIFFERENTIAL/PLATELET
BASO%: 0.3 % (ref 0.0–2.0)
BASOS ABS: 0 10*3/uL (ref 0.0–0.1)
EOS%: 4.1 % (ref 0.0–7.0)
Eosinophils Absolute: 0.2 10*3/uL (ref 0.0–0.5)
HCT: 45.5 % (ref 38.4–49.9)
HGB: 15.7 g/dL (ref 13.0–17.1)
LYMPH%: 32.4 % (ref 14.0–49.0)
MCH: 40.5 pg — AB (ref 27.2–33.4)
MCHC: 34.5 g/dL (ref 32.0–36.0)
MCV: 117.3 fL — ABNORMAL HIGH (ref 79.3–98.0)
MONO#: 0.4 10*3/uL (ref 0.1–0.9)
MONO%: 10.1 % (ref 0.0–14.0)
NEUT#: 2.1 10*3/uL (ref 1.5–6.5)
NEUT%: 53.1 % (ref 39.0–75.0)
Platelets: 115 10*3/uL — ABNORMAL LOW (ref 140–400)
RBC: 3.88 10*6/uL — AB (ref 4.20–5.82)
RDW: 14.1 % (ref 11.0–14.6)
WBC: 3.9 10*3/uL — ABNORMAL LOW (ref 4.0–10.3)
lymph#: 1.3 10*3/uL (ref 0.9–3.3)

## 2016-11-26 MED ORDER — FEBUXOSTAT 40 MG PO TABS: 40.0000 mg | ORAL_TABLET | Freq: Every day | ORAL | 1 refills | Status: DC

## 2016-11-26 MED ORDER — HYDROXYUREA 500 MG PO CAPS: 1000.0000 mg | ORAL_CAPSULE | Freq: Every day | ORAL | 3 refills | Status: DC

## 2016-11-26 MED ORDER — COLCHICINE 0.6 MG PO TABS: 0.6000 mg | ORAL_TABLET | Freq: Every day | ORAL | 1 refills | Status: DC

## 2016-11-26 NOTE — Progress Notes (Signed)
CC: here for f/u gout and HTN  HPI:  [name] is a 71 y.o. man with a past medical history listed below here today for follow up of his gout and HTN.  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 Lovena Hamilton for his atrial fibrillation s/p ILR.  For details of today's visit and the status of his chronic medical issues please refer to the assessment and plan.   Past Medical History:  Diagnosis Date  . Atrial fibrillation /flutter    hx of flutter ablation 2/12  . Cancer ([place])   . Cardiomyopathy    EF55% 11/14<<35%   . CHF (congestive heart failure) ([place])   . COPD (chronic obstructive pulmonary disease) ([place])   . GERD (gastroesophageal reflux disease)   . Hepatitis    "think I had that once; a long time ago; from dirty needles I think" (12/14/2012)  . Hypertension   . Leukocytosis, unspecified 06/12/2013  . 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 Obesity   . Pneumonia   . Primary polycythemia ([place]) 06/12/2013  . Renal insufficiency   . Shortness of breath    "all the time lately" (12/14/2012)  . Substance abuse    alcohol  . Syncope     Review of Systems:  Please see pertinent ROS reviewed in HPI and problem based charting.   Physical Exam:  Vitals:   11/26/16 1322  BP: 126/63  Pulse: (!) 54  Temp: 98.2 F (36.8 C)  TempSrc: Oral  SpO2: 97%  Weight: 222 lb 14.4 oz (101.1 kg)  Height: 5\' 6"  (1.676 m)   Physical Exam  Constitutional: He is oriented to person, place, and time and well-developed, well-nourished, and in no distress.  HENT:  Head: Normocephalic and atraumatic.  Eyes: EOM are normal.  Cardiovascular: Normal rate and regular rhythm.   Pulmonary/Chest: Effort normal.  Neurological: He is alert and oriented to person, place, and time.  Skin: Skin is warm and dry.  He has no evidence of tophi. His feet have very dry skin.  Psychiatric: Mood and affect normal.     Assessment & Plan:   See Encounters  Tab for problem based charting.  Patient discussed with Dr. Daryll Drown .  Gout Assessment: His symptoms of left foot pain consistent with gout flare have completely resolved on treatment with colchicine.  Given his frequency of gout flare, he is interested in prophylactic therapy.  He currently does not have any symptoms of active flare and no signs of this on exam.  There is also no evidence of tophi.  He is on warfarin and amiodarone for his atrial fibrillation.  Last uric acid level in 2014 was 8.0  Plan: - will check uric acid today.  Goal is < 6.0 - considered allopurinol but given interaction with current medications, will start febuxostat 40mg  daily.  Will also continue colchicine during this initiation period to hopefully avoid flare up - he will RTC in 2-4 weeks to repeat uric acid level.  Continue colchicine until < 6.0  COPD (chronic obstructive pulmonary disease) (HCC) Assessment: Current symptoms controlled on regimen of Spiriva daily, Fluticasone inhaler BID, and albuterol as needed.  He reports occasional wheezing and dyspnea with increased exertion but this is unchanged recently.    Plan: - continue current regimen - monitor for progression of symptoms, consider repeat PFTs if gets worse given his treatment with amiodarone  Heart failure with preserved left ventricular function (HFpEF) (HCC) Assessment: Last echo on  06/07/14 with grade 1 diastolic dysfunction, EF 77-41%.  He is on Lasix 40mg  daily.  He reports that he is still taking this.  He has no lower extremity edema and appears euvolemic.  He denies orthopnea or PND.  He has stable dyspnea.  Plan: - continue Lasix 40mg  daily - check renal function today.  Atrial fibrillation (HCC) Assessment: Currently maintaining NSR.  On amiodarone, carvedilol, and warfarin. This patients CHA2DS2-VASc Score and unadjusted Ischemic Stroke Rate (% per year) is equal to 3.2 % stroke rate/year from a score of 3.  Plan: - continue  warfarin, amiodarone, and carvedilol - TSH today normal - LFT's today normal

## 2016-11-26 NOTE — Assessment & Plan Note (Signed)
Assessment: His symptoms of left foot pain consistent with gout flare have completely resolved on treatment with colchicine.  Given his frequency of gout flare, he is interested in prophylactic therapy.  He currently does not have any symptoms of active flare and no signs of this on exam.  There is also no evidence of tophi.  He is on warfarin and amiodarone for his atrial fibrillation.  Last uric acid level in 2014 was 8.0  Plan: - will check uric acid today.  Goal is < 6.0 - considered allopurinol but given interaction with current medications, will start febuxostat 40mg  daily.  Will also continue colchicine during this initiation period to hopefully avoid flare up - he will RTC in 2-4 weeks to repeat uric acid level.  Continue colchicine until < 6.0

## 2016-11-26 NOTE — Assessment & Plan Note (Signed)
The patient has JAK2 mutation positive myeloproliferative disorder. He tolerated the hydroxyurea well.  He will continue taking hydroxyurea 1000 mg daily I recommend we continue the same and I will see him back in 3 months with repeat history, physical examination and blood work

## 2016-11-26 NOTE — Assessment & Plan Note (Signed)
There is no contraindication to remain on antiplatelet agents or anticoagulants as long as the platelet is greater than 50,000. ° ° °

## 2016-11-26 NOTE — Assessment & Plan Note (Signed)
He has acquired pancytopenia, multifactorial, could be due to his medications and alcoholism I recommend he stops drinking alcohol For now, I do not feel strongly we need to change the dose of hydroxyurea.  I will see him back again in 3 months with repeat blood work

## 2016-11-26 NOTE — Assessment & Plan Note (Signed)
I spent some time counseling the patient the importance of tobacco cessation. He is currently attempting to quit on his own.  

## 2016-11-26 NOTE — Assessment & Plan Note (Signed)
Assessment: Last echo on 06/07/14 with grade 1 diastolic dysfunction, EF 24-58%.  He is on Lasix 40mg  daily.  He reports that he is still taking this.  He has no lower extremity edema and appears euvolemic.  He denies orthopnea or PND.  He has stable dyspnea.  Plan: - continue Lasix 40mg  daily - check renal function today.

## 2016-11-26 NOTE — Patient Instructions (Addendum)
Thank you for coming to see me today. It was a pleasure. Today we talked about:   Gout/Toe Pain:  - we are starting 2 medications today: colchicine and febuxostat.  Take these daily  We are checking some labwork today.  I will let you know if anything is abnormal.  Please follow-up with [place] in 2-4 weeks.  If you have any questions or concerns, please do not hesitate to call the office at [phone].  Take Care,   Jule Ser, DO  Gout Gout is painful swelling that can happen in some of your joints. Gout is a type of arthritis. This condition is caused by having too much uric acid in your body. Uric acid is a chemical that is made when your body breaks down substances called purines. If your body has too much uric acid, sharp crystals can form and build up in your joints. This causes pain and swelling. Gout attacks can happen quickly and be very painful (acute gout). Over time, the attacks can affect more joints and happen more often (chronic gout). Follow these instructions at home: During a Gout Attack   If directed, put ice on the painful area:  Put ice in a plastic bag.  Place a towel between your skin and the bag.  Leave the ice on for 20 minutes, 2-3 times a day.  Rest the joint as much as possible. If the joint is in your leg, you may be given crutches to use.  Raise (elevate) the painful joint above the level of your heart as often as you can.  Drink enough fluids to keep your pee (urine) clear or pale yellow.  Take over-the-counter and prescription medicines only as told by your doctor.  Do not drive or use heavy machinery while taking prescription pain medicine.  Follow instructions from your doctor about what you can or cannot eat and drink.  Return to your normal activities as told by your doctor. Ask your doctor what activities are safe for you. Avoiding Future Gout Attacks   Follow a low-purine diet as told by a specialist (dietitian) or your doctor.  Avoid foods and drinks that have a lot of purines, such as:  Liver.  Kidney.  Anchovies.  Asparagus.  Herring.  Mushrooms  Mussels.  Beer.  Limit alcohol intake to no more than 1 drink a day for nonpregnant women and 2 drinks a day for men. One drink equals 12 oz of beer, 5 oz of wine, or 1 oz of hard liquor.  Stay at a healthy weight or lose weight if you are overweight. If you want to lose weight, talk with your doctor. It is important that you do not lose weight too fast.  Start or continue an exercise plan as told by your doctor.  Drink enough fluids to keep your pee clear or pale yellow.  Take over-the-counter and prescription medicines only as told by your doctor.  Keep all follow-up visits as told by your doctor. This is important. Contact a doctor if:  You have another gout attack.  You still have symptoms of a gout attack after10 days of treatment.  You have problems (side effects) because of your medicines.  You have chills or a fever.  You have burning pain when you pee (urinate).  You have pain in your lower back or belly. Get help right away if:  You have very bad pain.  Your pain cannot be controlled.  You cannot pee. This information is not intended to  replace advice given to you by your health care provider. Make sure you discuss any questions you have with your health care provider. Document Released: 04/14/2008 Document Revised: 12/12/2015 Document Reviewed: 04/18/2015 Elsevier Interactive Patient Education  2017 [employer].

## 2016-11-26 NOTE — Progress Notes (Signed)
[facility] OFFICE PROGRESS NOTE  Patient Care Team: Jule Ser, DO as PCP - General Heath Lark, MD as Consulting Physician (Hematology and Oncology)  SUMMARY OF ONCOLOGIC HISTORY:  This is a pleasant gentleman who is being referred here because of high hemoglobin level. In November 2014, we removed one unit of blood and order an additional workup to rule out myeloproliferative disorder. Blood test result confirmed low erythropoietin level along with detectable JAK 2 mutation, confirmed myeloproliferative disorder Unfortunately, bone marrow biopsy on 06/26/2013 was nondiagnostic On 07/31/13 the patient was started on 1 hydroxyurea a day On 08/15/2013, increased hydroxyurea to 2 tablets a day and remove one more unit of blood due to the high hemoglobin On 09/05/2013, I increased hydroxyurea to 3 tablets a day and remove one more unit of blood due to high hemoglobin and hematocrit On 01/23/2014, dose of hydroxyurea is reduced to 2 tablets a day due to anemia. On 04/23/2015, dose of hydroxyurea is adjusted to 500 mg on Mondays, Wednesdays and Fridays and to take 1000 mg for the rest of the week Subsequently, he was placed on hydroxyurea 1000 mg daily  INTERVAL HISTORY: Please see below for problem oriented charting. He is compliant taking hydroxyurea as directed He continues to smoke and drink on a regular basis. He had recent cellulitis, resolved with antibiotics The patient denies any recent signs or symptoms of bleeding such as spontaneous epistaxis, hematuria or hematochezia. He denies recent chest pain or shortness of breath.  REVIEW OF SYSTEMS:   Constitutional: Denies fevers, chills or abnormal weight loss Eyes: Denies blurriness of vision Ears, nose, mouth, throat, and face: Denies mucositis or sore throat Respiratory: Denies cough, dyspnea or wheezes Cardiovascular: Denies palpitation, chest discomfort or lower extremity swelling Gastrointestinal:  Denies  nausea, heartburn or change in bowel habits Skin: Denies abnormal skin rashes Lymphatics: Denies new lymphadenopathy or easy bruising Neurological:Denies numbness, tingling or new weaknesses Behavioral/Psych: Mood is stable, no new changes  All other systems were reviewed with the patient and are negative.  I have reviewed the past medical history, past surgical history, social history and family history with the patient and they are unchanged from previous note.  ALLERGIES:  has No Known Allergies.  MEDICATIONS:  Current Outpatient Prescriptions  Medication Sig Dispense Refill  . albuterol (PROAIR HFA) 108 (90 Base) MCG/ACT inhaler Inhale 1-2 puffs into the lungs every 6 (six) hours as needed for wheezing or shortness of breath. PLACE ON HOLD UNTIL PATIENT REQUESTS TO FILL (Patient taking differently: Inhale 1-2 puffs into the lungs every 6 (six) hours as needed for wheezing or shortness of breath. ) 18 g 3  . amiodarone (PACERONE) 200 MG tablet take 1 tablet by mouth once daily 30 tablet 6  . carvedilol (COREG) 12.5 MG tablet take 1 tablet by mouth every morning 30 tablet 5  . clindamycin (CLEOCIN) 300 MG capsule Take 1 capsule (300 mg total) by mouth 3 (three) times daily. 21 capsule 0  . colchicine 0.6 MG tablet Take two tablets initially, followed by another tablet an hour later if still having symptoms. Then take one tablet daily. 30 tablet 0  . fluticasone (FLOVENT HFA) 44 MCG/ACT inhaler Inhale 2 puffs into the lungs 2 (two) times daily.    . folic acid (FOLVITE) 1 MG tablet take 1 tablet by mouth once daily 90 tablet 3  . furosemide (LASIX) 40 MG tablet Take 1 tablet (40 mg total) by mouth daily. 90 tablet 3  . hydroxyurea (HYDREA)  500 MG capsule Take 2 capsules (1,000 mg total) by mouth daily. May take with food to minimize GI side effects. 60 capsule 3  . SUBOXONE 8-2 MG FILM Place 1 Film under the tongue daily.     [name] tiotropium (SPIRIVA HANDIHALER) 18 MCG inhalation capsule Place  1 capsule (18 mcg total) into inhaler and inhale daily. PLACE ON HOLD UNTIL PATIENT REQUESTS TO FILL 30 capsule 11  . warfarin (COUMADIN) 2.5 MG tablet Take 1 tablet (2.5 mg total) by mouth daily. 30 tablet 3   No current facility-administered medications for this visit.     PHYSICAL EXAMINATION: ECOG PERFORMANCE STATUS: 0 - Asymptomatic  Vitals:   11/26/16 0915  BP: 131/72  Pulse: (!) 59  Resp: 18  Temp: 98.5 F (36.9 C)   Filed Weights   11/26/16 0915  Weight: 222 lb 3.2 oz (100.8 kg)    GENERAL:alert, no distress and comfortable.  He is morbidly obese SKIN: skin color, texture, turgor are normal, no rashes or significant lesions EYES: normal, Conjunctiva are pink and non-injected, sclera clear OROPHARYNX:no exudate, no erythema and lips, buccal mucosa, and tongue normal  NECK: supple, thyroid normal size, non-tender, without nodularity LYMPH:  no palpable lymphadenopathy in the cervical, axillary or inguinal LUNGS: Bilateral scattered wheezes are noted HEART: regular rate & rhythm and no murmurs and no lower extremity edema ABDOMEN:abdomen soft, non-tender and normal bowel sounds Musculoskeletal:no cyanosis of digits and no clubbing  NEURO: alert & oriented x 3 with fluent speech, no focal motor/sensory deficits  LABORATORY DATA:  I have reviewed the data as listed    Component Value Date/Time   NA 140 01/24/2016 0727   NA 143 08/01/2015 1351   NA 143 06/12/2013 1125   K 3.7 01/24/2016 0727   K 3.7 06/12/2013 1125   CL 106 01/24/2016 0727   CO2 27 01/24/2016 0727   CO2 32 (H) 06/12/2013 1125   GLUCOSE 102 (H) 01/24/2016 0727   GLUCOSE 92 06/12/2013 1125   BUN 21 (H) 01/24/2016 0727   BUN 22 08/01/2015 1351   BUN 14.6 06/12/2013 1125   CREATININE 1.19 01/24/2016 0727   CREATININE 1.05 08/30/2014 1628   CREATININE 0.9 06/12/2013 1125   CALCIUM 9.0 01/24/2016 0727   CALCIUM 8.9 06/12/2013 1125   PROT 6.9 01/24/2016 0727   PROT 6.9 06/12/2013 1125   ALBUMIN  3.2 (L) 01/24/2016 0727   ALBUMIN 3.3 (L) 06/12/2013 1125   AST 23 01/24/2016 0727   AST 21 06/12/2013 1125   ALT 19 01/24/2016 0727   ALT 28 06/12/2013 1125   ALKPHOS 85 01/24/2016 0727   ALKPHOS 112 06/12/2013 1125   BILITOT 0.8 01/24/2016 0727   BILITOT 0.82 06/12/2013 1125   GFRNONAA >60 01/24/2016 0727   GFRNONAA 73 08/30/2014 1628   GFRAA >60 01/24/2016 0727   GFRAA 84 08/30/2014 1628    No results found for: SPEP, UPEP  Lab Results  Component Value Date   WBC 3.9 (L) 11/26/2016   NEUTROABS 2.1 11/26/2016   HGB 15.7 11/26/2016   HCT 45.5 11/26/2016   MCV 117.3 (H) 11/26/2016   PLT 115 (L) 11/26/2016      Chemistry      Component Value Date/Time   NA 140 01/24/2016 0727   NA 143 08/01/2015 1351   NA 143 06/12/2013 1125   K 3.7 01/24/2016 0727   K 3.7 06/12/2013 1125   CL 106 01/24/2016 0727   CO2 27 01/24/2016 0727   CO2 32 (H) 06/12/2013  1125   BUN 21 (H) 01/24/2016 0727   BUN 22 08/01/2015 1351   BUN 14.6 06/12/2013 1125   CREATININE 1.19 01/24/2016 0727   CREATININE 1.05 08/30/2014 1628   CREATININE 0.9 06/12/2013 1125      Component Value Date/Time   CALCIUM 9.0 01/24/2016 0727   CALCIUM 8.9 06/12/2013 1125   ALKPHOS 85 01/24/2016 0727   ALKPHOS 112 06/12/2013 1125   AST 23 01/24/2016 0727   AST 21 06/12/2013 1125   ALT 19 01/24/2016 0727   ALT 28 06/12/2013 1125   BILITOT 0.8 01/24/2016 0727   BILITOT 0.82 06/12/2013 1125      ASSESSMENT & PLAN:  Polycythemia vera ([place]) The patient has JAK2 mutation positive myeloproliferative disorder. He tolerated the hydroxyurea well.  He will continue taking hydroxyurea 1000 mg daily I recommend we continue the same and I will see him back in 3 months with repeat history, physical examination and blood work   Pancytopenia, acquired ([place]) He has acquired pancytopenia, multifactorial, could be due to his medications and alcoholism I recommend he stops drinking alcohol For now, I do not feel strongly  we need to change the dose of hydroxyurea.  I will see him back again in 3 months with repeat blood work  Tobacco abuse I spent some time counseling the patient the importance of tobacco cessation. He is currently attempting to quit on his own   Thrombocytopenia due to drugs There is no contraindication to remain on antiplatelet agents or anticoagulants as long as the platelet is greater than 50,000.     No orders of the defined types were placed in this encounter.  All questions were answered. The patient knows to call the clinic with any problems, questions or concerns. No barriers to learning was detected. I spent 15 minutes counseling the patient face to face. The total time spent in the appointment was 20 minutes and more than 50% was on counseling and review of test results     Heath Lark, MD 11/26/2016 9:17 AM

## 2016-11-26 NOTE — Telephone Encounter (Signed)
Scheduled appt per 5/10 los. - Epic froze when patient was in scheduling he said it was okay to just mail him his appt time and date. - letter sent

## 2016-11-26 NOTE — Assessment & Plan Note (Addendum)
Assessment: Current symptoms controlled on regimen of Spiriva daily, Fluticasone inhaler BID, and albuterol as needed.  He reports occasional wheezing and dyspnea with increased exertion but this is unchanged recently.    Plan: - continue current regimen - monitor for progression of symptoms, consider repeat PFTs if gets worse given his treatment with amiodarone

## 2016-11-27 LAB — CMP14 + ANION GAP
A/G RATIO: 1.3 (ref 1.2–2.2)
ALBUMIN: 3.8 g/dL (ref 3.6–4.8)
ALK PHOS: 102 IU/L (ref 39–117)
ALT: 34 IU/L (ref 0–44)
AST: 27 IU/L (ref 0–40)
Anion Gap: 14 mmol/L (ref 10.0–18.0)
BUN / CREAT RATIO: 18 (ref 10–24)
BUN: 20 mg/dL (ref 8–27)
Bilirubin Total: 0.4 mg/dL (ref 0.0–1.2)
CO2: 31 mmol/L — ABNORMAL HIGH (ref 18–29)
Calcium: 8.8 mg/dL (ref 8.6–10.2)
Chloride: 97 mmol/L (ref 96–106)
Creatinine, Ser: 1.1 mg/dL (ref 0.76–1.27)
GFR calc Af Amer: 79 mL/min/{1.73_m2} (ref >59)
GFR calc non Af Amer: 68 mL/min/{1.73_m2} (ref >59)
GLOBULIN, TOTAL: 3 g/dL (ref 1.5–4.5)
Glucose: 94 mg/dL (ref 65–99)
POTASSIUM: 3.5 mmol/L (ref 3.5–5.2)
SODIUM: 142 mmol/L (ref 134–144)
Total Protein: 6.8 g/dL (ref 6.0–8.5)

## 2016-11-27 LAB — TSH: TSH: 2.11 u[IU]/mL (ref 0.450–4.500)

## 2016-11-27 LAB — URIC ACID: URIC ACID: 11 mg/dL — AB (ref 3.7–8.6)

## 2016-11-27 NOTE — Assessment & Plan Note (Signed)
Assessment: Currently maintaining NSR.  On amiodarone, carvedilol, and warfarin. This patients CHA2DS2-VASc Score and unadjusted Ischemic Stroke Rate (% per year) is equal to 3.2 % stroke rate/year from a score of 3.  Plan: - continue warfarin, amiodarone, and carvedilol - TSH today normal - LFT's today normal

## 2016-12-01 NOTE — Progress Notes (Signed)
Internal Medicine Clinic Attending  Case discussed with Dr. Wallace soon after the resident saw the patient.  We reviewed the resident's history and exam and pertinent patient test results.  I agree with the assessment, diagnosis, and plan of care documented in the resident's note. 

## 2016-12-03 ENCOUNTER — Telehealth: Payer: Self-pay | Admitting: *Deleted

## 2016-12-03 NOTE — Telephone Encounter (Addendum)
Information was faxed to [facility] for [place] request for Uloric.  Will receive faxed reply within 5days.  Sander Nephew, RN 12/03/2016 10:18 AM. Prior Authorization for Uloric was approved 07/18/2016 thru 07/19/2017.  Sander Nephew, RN 12/07/2016 9:34 AM.

## 2016-12-21 ENCOUNTER — Other Ambulatory Visit: Payer: Self-pay | Admitting: Internal Medicine

## 2016-12-21 ENCOUNTER — Ambulatory Visit (INDEPENDENT_AMBULATORY_CARE_PROVIDER_SITE_OTHER): Payer: Medicare Other | Admitting: Pharmacist

## 2016-12-21 DIAGNOSIS — Z7901 Long term (current) use of anticoagulants: Secondary | ICD-10-CM | POA: Diagnosis not present

## 2016-12-21 DIAGNOSIS — I4891 Unspecified atrial fibrillation: Secondary | ICD-10-CM | POA: Diagnosis not present

## 2016-12-21 LAB — POCT INR: INR: 2.2

## 2016-12-21 MED ORDER — WARFARIN SODIUM 2.5 MG PO TABS: ORAL_TABLET | ORAL | 3 refills | Status: DC

## 2016-12-21 NOTE — Progress Notes (Signed)
INTERNAL MEDICINE TEACHING ATTENDING ADDENDUM - Jamiria Langill M.D  Duration- indefinite, Indication- afib, INR- therapeutic. Agree with pharmacy recommendations as outlined in their note.     

## 2016-12-21 NOTE — Progress Notes (Signed)
Anti-Coagulation Progress Note  [name] is a 71 y.o. male who is currently on an anti-coagulation regimen for atrial fibrillation ([place]) [148.91] for an indefinite defined treatment course. Annual discussion with patient for shared decision making on continued risks vs. Benefits of continued oral anticoagulation for reduction in risk of embolic stroke is encouraged by the guidelines of the [employer] of [employer] Physicians section on oral anticoagulation management with warfarin.   RECENT RESULTS: Recent results are below, the most recent result is correlated with a dose of 13.75 mg. per week: Lab Results  Component Value Date   INR 2.20 12/21/2016   INR 2.90 11/16/2016   INR 1.40 11/02/2016    ANTI-COAG DOSE: Anticoagulation Warfarin Dose Instructions as of 12/21/2016      [name] Tue Wed Thu Fri Sat   New Dose 2.5 mg 2.5 mg 2.5 mg 1.25 mg 2.5 mg 1.25 mg 2.5 mg    Description   Take 1/2 tablet on Wednesdays and Fridays;  all other days--take 1 tablet.       ANTICOAG SUMMARY: Anticoagulation Episode Summary    Current INR goal:   2.0-3.0  TTR:   68.7 % (4.1 y)  Next INR check:   01/18/2017  INR from last check:   2.20 (12/21/2016)  Weekly max warfarin dose:     Target end date:   Indefinite  [facility]  Preferred lab:     Send INR reminders to:      Indications   Atrial fibrillation ([place]) [I48.91]       Comments:           ANTICOAG TODAY: Anticoagulation Summary  As of 12/21/2016   INR goal:   2.0-3.0  TTR:     Today's INR:   2.20  Next INR check:   01/18/2017  Target end date:   Indefinite   Indications   Atrial fibrillation ([facility]) [I48.91]        Anticoagulation Episode Summary    [facility]   [facility]   Preferred lab:      Send INR reminders to:      Comments:         PATIENT INSTRUCTIONS: Patient Instructions  Patient instructed to take medications as defined in the Anti-coagulation Track section  of this encounter.  Patient instructed to take today's dose.  Patient instructed to take 1/2 tablet on Wednesdays and Fridays;  all other days--take 1 tablet.  Patient verbalized understanding of these instructions.       FOLLOW-UP Return in 4 weeks (on 01/18/2017) for Follow up INR at 0900h.  [name], III Pharm.D., CACP

## 2016-12-21 NOTE — Patient Instructions (Signed)
Patient instructed to take medications as defined in the Anti-coagulation Track section of this encounter.  Patient instructed to take today's dose.  Patient instructed to take 1/2 tablet on Wednesdays and Fridays;  all other days--take 1 tablet.  Patient verbalized understanding of these instructions.

## 2017-01-18 ENCOUNTER — Ambulatory Visit (INDEPENDENT_AMBULATORY_CARE_PROVIDER_SITE_OTHER): Payer: Medicare Other | Admitting: Pharmacist

## 2017-01-18 ENCOUNTER — Telehealth: Payer: Self-pay

## 2017-01-18 ENCOUNTER — Other Ambulatory Visit: Payer: Self-pay | Admitting: *Deleted

## 2017-01-18 DIAGNOSIS — I4891 Unspecified atrial fibrillation: Secondary | ICD-10-CM

## 2017-01-18 DIAGNOSIS — Z7901 Long term (current) use of anticoagulants: Secondary | ICD-10-CM

## 2017-01-18 DIAGNOSIS — I5022 Chronic systolic (congestive) heart failure: Secondary | ICD-10-CM

## 2017-01-18 LAB — POCT INR: INR: 3

## 2017-01-18 NOTE — Progress Notes (Signed)
Anticoagulation Management [name] is a 71 y.o. male who reports to the clinic for monitoring of warfarin treatment.    Indication: atrial fibrillation  Duration: indefinite Supervising physician: Joni Reining  Anticoagulation Clinic Visit History: Patient does not report signs/symptoms of bleeding or thromboembolism  Other recent changes: No diet, medications, lifestyle endorsed by patient.  Anticoagulation Episode Summary    Current INR goal:   2.0-3.0  TTR:   69.2 % (4.2 y)  Next INR check:   02/15/2017  INR from last check:   3.00 (01/18/2017)  Weekly max warfarin dose:     Target end date:   Indefinite  INR check location:   [facility]  Preferred lab:     Send INR reminders to:      Indications   Atrial fibrillation ([place]) [I48.91]       Comments:          ASSESSMENT Recent Results: The most recent result is correlated with 15 mg per week: Lab Results  Component Value Date   INR 3.00 01/18/2017   INR 2.20 12/21/2016   INR 2.90 11/16/2016    Anticoagulation Dosing: INR as of 01/18/2017 and Previous Warfarin Dosing Information    INR Dt INR Goal [name] [name]   01/18/2017 3.00 2.0-3.0 15 mg 2.5 mg 2.5 mg 2.5 mg 1.25 mg 2.5 mg 1.25 mg 2.5 mg    Previous description   Take 1/2 tablet on Wednesdays and Fridays;  all other days--take 1 tablet.    Anticoagulation Warfarin Dose Instructions as of 01/18/2017      Total [name]   New Dose 13.75 mg 2.5 mg 2.5 mg 1.25 mg 2.5 mg 1.25 mg 2.5 mg 1.25 mg     (2.5 mg x 1)  (2.5 mg x 1)  (2.5 mg x 0.5)  (2.5 mg x 1)  (2.5 mg x 0.5)  (2.5 mg x 1)  (2.5 mg x 0.5)                         Description   Take 1/2 tablet on Tuesdays, Thursdays and Saturdays;  all other days--take 1 tablet.      INR today: Therapeutic  PLAN Weekly dose was decreased by 8% to 13.75 mg per week  Patient Instructions  Patient instructed to take medications as defined in the  Anti-coagulation Track section of this encounter.  Patient instructed to take today's dose. (Already taken for the day).  Patient instructed to 1/2 tablet on Tuesdays, Thursdays and Saturdays;  all other days--take 1 tablet.  Patient verbalized understanding of these instructions.     Patient advised to contact clinic or seek medical attention if signs/symptoms of bleeding or thromboembolism occur.  Patient verbalized understanding by repeating back information and was advised to contact me if further medication-related questions arise. Patient was also provided an information handout.  Follow-up Return in 4 weeks (on 02/15/2017) for Follow up INR at 0845h.  Caryl Bis PharmD, CACP, CPP  15 minutes spent face-to-face with the patient during the encounter. 50% of time spent on education. 50% of time was spent on finger-stick, point of care INR specimen collection, processing, interpretation and data-entry to EPIC/CHL and [email or web address] .

## 2017-01-18 NOTE — Progress Notes (Signed)
INTERNAL MEDICINE TEACHING ATTENDING ADDENDUM - Ryan Groves, DO Duration- indefinate, Indication- A fib, INR-  Lab Results  Component Value Date   INR 3.00 01/18/2017  . Agree with pharmacy recommendations as outlined in their note.

## 2017-01-18 NOTE — Patient Instructions (Signed)
Patient instructed to take medications as defined in the Anti-coagulation Track section of this encounter.  Patient instructed to take today's dose. (Already taken for the day).  Patient instructed to 1/2 tablet on Tuesdays, Thursdays and Saturdays;  all other days--take 1 tablet.  Patient verbalized understanding of these instructions.

## 2017-01-19 MED ORDER — FUROSEMIDE 40 MG PO TABS: 40.0000 mg | ORAL_TABLET | Freq: Every day | ORAL | 3 refills | Status: DC

## 2017-01-29 NOTE — Telephone Encounter (Signed)
Request empty

## 2017-02-15 ENCOUNTER — Encounter: Payer: Self-pay | Admitting: Internal Medicine

## 2017-02-15 ENCOUNTER — Encounter (HOSPITAL_COMMUNITY): Payer: Self-pay | Admitting: General Practice

## 2017-02-15 ENCOUNTER — Ambulatory Visit (INDEPENDENT_AMBULATORY_CARE_PROVIDER_SITE_OTHER): Payer: Medicare Other | Admitting: Internal Medicine

## 2017-02-15 ENCOUNTER — Ambulatory Visit (INDEPENDENT_AMBULATORY_CARE_PROVIDER_SITE_OTHER): Payer: Medicare Other | Admitting: Pharmacist

## 2017-02-15 ENCOUNTER — Observation Stay (HOSPITAL_COMMUNITY)
Admission: AD | Admit: 2017-02-15 | Discharge: 2017-02-16 | Disposition: A | Discharge status: Home / Self Care | Payer: Medicare Other | Source: Ambulatory Visit | Attending: Internal Medicine | Admitting: Internal Medicine

## 2017-02-15 VITALS — BP 132/64 | HR 96 | Wt 206.6 lb

## 2017-02-15 DIAGNOSIS — N179 Acute kidney failure, unspecified: Secondary | ICD-10-CM | POA: Diagnosis not present

## 2017-02-15 DIAGNOSIS — I10 Essential (primary) hypertension: Secondary | ICD-10-CM | POA: Diagnosis not present

## 2017-02-15 DIAGNOSIS — I4891 Unspecified atrial fibrillation: Secondary | ICD-10-CM | POA: Diagnosis present

## 2017-02-15 DIAGNOSIS — J449 Chronic obstructive pulmonary disease, unspecified: Secondary | ICD-10-CM | POA: Diagnosis not present

## 2017-02-15 DIAGNOSIS — M109 Gout, unspecified: Secondary | ICD-10-CM | POA: Diagnosis present

## 2017-02-15 DIAGNOSIS — F17210 Nicotine dependence, cigarettes, uncomplicated: Secondary | ICD-10-CM | POA: Insufficient documentation

## 2017-02-15 DIAGNOSIS — K921 Melena: Secondary | ICD-10-CM

## 2017-02-15 DIAGNOSIS — K920 Hematemesis: Principal | ICD-10-CM | POA: Insufficient documentation

## 2017-02-15 DIAGNOSIS — K922 Gastrointestinal hemorrhage, unspecified: Secondary | ICD-10-CM | POA: Diagnosis present

## 2017-02-15 DIAGNOSIS — Z7901 Long term (current) use of anticoagulants: Secondary | ICD-10-CM | POA: Diagnosis not present

## 2017-02-15 DIAGNOSIS — I509 Heart failure, unspecified: Secondary | ICD-10-CM | POA: Diagnosis not present

## 2017-02-15 DIAGNOSIS — F1020 Alcohol dependence, uncomplicated: Secondary | ICD-10-CM

## 2017-02-15 DIAGNOSIS — D696 Thrombocytopenia, unspecified: Secondary | ICD-10-CM

## 2017-02-15 HISTORY — DX: Gastrointestinal hemorrhage, unspecified: K92.2

## 2017-02-15 HISTORY — DX: Gout, unspecified: M10.9

## 2017-02-15 LAB — BASIC METABOLIC PANEL
Anion gap: 12 (ref 5–15)
BUN: 20 mg/dL (ref 6–20)
CO2: 30 mmol/L (ref 22–32)
Calcium: 8.8 mg/dL — ABNORMAL LOW (ref 8.9–10.3)
Chloride: 99 mmol/L — ABNORMAL LOW (ref 101–111)
Creatinine, Ser: 1.5 mg/dL — ABNORMAL HIGH (ref 0.61–1.24)
GFR calc Af Amer: 53 mL/min — ABNORMAL LOW (ref >60)
GFR, EST NON AFRICAN AMERICAN: 46 mL/min — AB (ref >60)
Glucose, Bld: 115 mg/dL — ABNORMAL HIGH (ref 65–99)
Potassium: 3 mmol/L — ABNORMAL LOW (ref 3.5–5.1)
Sodium: 141 mmol/L (ref 135–145)

## 2017-02-15 LAB — CBC
HCT: 43.3 % (ref 39.0–52.0)
HEMATOCRIT: 40.2 % (ref 39.0–52.0)
HEMOGLOBIN: 14.2 g/dL (ref 13.0–17.0)
Hemoglobin: 15.5 g/dL (ref 13.0–17.0)
MCH: 41.2 pg — ABNORMAL HIGH (ref 26.0–34.0)
MCH: 40.9 pg — ABNORMAL HIGH (ref 26.0–34.0)
MCHC: 35.8 g/dL (ref 30.0–36.0)
MCHC: 35.3 g/dL (ref 30.0–36.0)
MCV: 115.2 fL — ABNORMAL HIGH (ref 78.0–100.0)
MCV: 115.9 fL — ABNORMAL HIGH (ref 78.0–100.0)
Platelets: 164 10*3/uL (ref 150–400)
Platelets: 162 10*3/uL (ref 150–400)
RBC: 3.76 MIL/uL — ABNORMAL LOW (ref 4.22–5.81)
RBC: 3.47 MIL/uL — AB (ref 4.22–5.81)
RDW: 13.1 % (ref 11.5–15.5)
RDW: 13.3 % (ref 11.5–15.5)
WBC: 5.7 10*3/uL (ref 4.0–10.5)
WBC: 5.4 10*3/uL (ref 4.0–10.5)

## 2017-02-15 LAB — TROPONIN I: Troponin I: <0.03 ng/mL (ref <0.03)

## 2017-02-15 LAB — GLUCOSE, CAPILLARY: GLUCOSE-CAPILLARY: 83 mg/dL (ref 65–99)

## 2017-02-15 LAB — MAGNESIUM: MAGNESIUM: 1.8 mg/dL (ref 1.7–2.4)

## 2017-02-15 LAB — POCT INR: INR: 2.1

## 2017-02-15 MED ORDER — SODIUM CHLORIDE 0.9 % IV SOLN
INTRAVENOUS | Status: DC
  Administered 2017-02-15 – 2017-02-16 (×2): via INTRAVENOUS

## 2017-02-15 MED ORDER — PANTOPRAZOLE SODIUM 40 MG IV SOLR
40.0000 mg | INTRAVENOUS | Status: DC
  Administered 2017-02-15: 40 mg via INTRAVENOUS
  Filled 2017-02-15: qty 40

## 2017-02-15 MED ORDER — FOLIC ACID 1 MG PO TABS
1.0000 mg | ORAL_TABLET | Freq: Every day | ORAL | Status: DC
  Administered 2017-02-16: 1 mg via ORAL
  Filled 2017-02-15: qty 1

## 2017-02-15 MED ORDER — TIOTROPIUM BROMIDE MONOHYDRATE 18 MCG IN CAPS
18.0000 ug | ORAL_CAPSULE | Freq: Every day | RESPIRATORY_TRACT | Status: DC
  Administered 2017-02-16: 18 ug via RESPIRATORY_TRACT
  Filled 2017-02-15: qty 5

## 2017-02-15 MED ORDER — BUPRENORPHINE HCL 2 MG SL SUBL
8.0000 mg | SUBLINGUAL_TABLET | Freq: Every day | SUBLINGUAL | Status: DC
  Administered 2017-02-16: 8 mg via SUBLINGUAL
  Filled 2017-02-15: qty 4

## 2017-02-15 MED ORDER — HYDROCORTISONE ACETATE 25 MG RE SUPP
25.0000 mg | Freq: Every day | RECTAL | Status: DC
  Administered 2017-02-15: 25 mg via RECTAL
  Filled 2017-02-15: qty 1

## 2017-02-15 MED ORDER — FEBUXOSTAT 40 MG PO TABS
40.0000 mg | ORAL_TABLET | Freq: Every day | ORAL | Status: DC
  Administered 2017-02-16: 40 mg via ORAL
  Filled 2017-02-15: qty 1

## 2017-02-15 MED ORDER — SODIUM CHLORIDE 0.9% FLUSH: 3.0000 mL | Freq: Two times a day (BID) | INTRAVENOUS | Status: DC

## 2017-02-15 MED ORDER — POTASSIUM CHLORIDE CRYS ER 20 MEQ PO TBCR
40.0000 meq | EXTENDED_RELEASE_TABLET | Freq: Two times a day (BID) | ORAL | Status: AC
  Administered 2017-02-15 (×2): 40 meq via ORAL
  Filled 2017-02-15 (×2): qty 2

## 2017-02-15 MED ORDER — BUDESONIDE 0.25 MG/2ML IN SUSP
0.2500 mg | Freq: Two times a day (BID) | RESPIRATORY_TRACT | Status: DC
  Administered 2017-02-15: 0.25 mg via RESPIRATORY_TRACT
  Filled 2017-02-15 (×2): qty 2

## 2017-02-15 MED ORDER — COLCHICINE 0.6 MG PO TABS
0.6000 mg | ORAL_TABLET | Freq: Every day | ORAL | Status: DC
  Administered 2017-02-16: 0.6 mg via ORAL
  Filled 2017-02-15: qty 1

## 2017-02-15 MED ORDER — HYDROXYUREA 500 MG PO CAPS
1000.0000 mg | ORAL_CAPSULE | Freq: Every day | ORAL | Status: DC
  Administered 2017-02-16: 1000 mg via ORAL
  Filled 2017-02-15: qty 2

## 2017-02-15 MED ORDER — HYDROCORTISONE 2.5 % RE CREA
TOPICAL_CREAM | Freq: Two times a day (BID) | RECTAL | Status: DC
  Administered 2017-02-16: 09:00:00 via RECTAL
  Filled 2017-02-15: qty 28.35

## 2017-02-15 MED ORDER — AMIODARONE HCL 200 MG PO TABS
200.0000 mg | ORAL_TABLET | Freq: Every day | ORAL | Status: DC
  Administered 2017-02-16: 200 mg via ORAL
  Filled 2017-02-15: qty 1

## 2017-02-15 MED ORDER — CALCIUM POLYCARBOPHIL 625 MG PO TABS
625.0000 mg | ORAL_TABLET | Freq: Every day | ORAL | Status: DC
  Administered 2017-02-16: 625 mg via ORAL
  Filled 2017-02-15 (×2): qty 1

## 2017-02-15 NOTE — Patient Instructions (Signed)
Patient instructed to take medications as defined in the Anti-coagulation Track section of this encounter.  Patient instructed to take today's dose.  Patient instructed to take 1/2 tablet on Tuesdays, Thursdays and Saturdays;  all other days--take 1 tablet.  Patient verbalized understanding of these instructions.

## 2017-02-15 NOTE — Consult Note (Signed)
Referring Provider: Internal Medicine Teaching Service. Primary Care Physician:  Jule Ser, DO Primary Gastroenterologist: Lucio Edward,  MD  Reason for Consultation:  Rectal bleeding   Attending physician's note   I have taken a history, examined the patient and reviewed the chart. I agree with the Advanced Practitioner's note, impression and recommendations.  71 yr M with small volume rectal bleeding likely hemorrhoidal hemorrhage. Hgb is stable.  Last colonoscopy Feb 2016 with removal of 3 small tubular adenomas.  Anusol suppository at bedtime daily X 7-10 days Benefiber 1 tablespoon TID with meals Avoid excessive straining If no further bleeding and hgb remains stable, ok to discharge home with follow up as outpatient.   Damaris Hippo, MD [phone] Mon-Fri 8a-5p [phone] after 5p, weekends, holidays    ASSESSMENT AND PLAN:    70. 71 yo male with painless rectal bleeding for several days. On coumadin, INR 2.1. Scant blood in vault on DRE. No external lesions. Suspect internal hemorrhoid bleeding.  Hgb at baseline (normal) -Trial of anusol or other steroid prep inside rectum bidl. No need for lower endoscopy at this point.   -keep stools soft, do not strain  2. Hypokalemia, normal Mg+.  --repletion per admitting team.   3. AKI, superimposed on ? CKD.   4. Obesity / hx of AFib / COPD with ongoing tobacco use.   5. Hx of adenomatous colon polyps without HGD Fbe 2015.   6. Borderline thrombocytopenia, chronic. Can evaluate spleen as outpatient.   HPI: [name] is a 71 y.o. male admitted from clinic for nausea and rectal bleeding. Hgb at baseline at 15.5. WBC normal. Painless bleeding started several days ago. Stool in brown, red blood in toilet and on stool every time he has a BM. Denies constipation or straining. No abdominal pain. He had an episode of vomiting at acute clinic today. Emesis non-bloody.  No NSAID use. He otherwise feels fine. No  prior hx of rectal bleeding / hemorrhoids or fissures.    Last colonoscopy:  Screening colonoscopy Feb 2015 complete exam, good prep Two sessile polyps at the cecum; polypectomies performed with a cold snare and with cold forceps 2. Semi-pedunculated polyp in the transverse colon; polypectomy performed with a cold snare 3. The examination was otherwise normal  Surgical [P], cecum, transverse, polyp (3) - TUBULAR ADENOMA (3 FRAGMENTS). NO HIGH GRADE DYSPLASIA OR MALIGNANCY IDENTIFIED. H Past Medical History:  Diagnosis Date  . Atrial fibrillation /flutter    hx of flutter ablation 2/12  . Cancer ([place])   . Cardiomyopathy    EF55% 11/14<<35%   . CHF (congestive heart failure) ([place])   . COPD (chronic obstructive pulmonary disease) ([place])   . GERD (gastroesophageal reflux disease)   . Hepatitis    "think I had that once; a long time ago; from dirty needles I think" (12/14/2012)  . Hypertension   . Leukocytosis, unspecified 06/12/2013  . 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 Obesity   . Pneumonia   . Primary polycythemia ([place]) 06/12/2013  . Renal insufficiency   . Shortness of breath    "all the time lately" (12/14/2012)  . Substance abuse    alcohol  . Syncope     Past Surgical History:  Procedure Laterality Date  . CARDIAC ELECTROPHYSIOLOGY MAPPING AND ABLATION  08/2010   [name] 09/07/2010 (12/14/2012)  . COLONOSCOPY     15-20 years ago had colon in [place]  1970's  . LOOP RECORDER IMPLANT  08-22-2013  MDT LinQ implanted by Dr Caryl Comes for syncope  . LOOP RECORDER IMPLANT N/A 08/23/2013   Procedure: LOOP RECORDER IMPLANT;  Surgeon: Deboraha Sprang, MD;  Location: [facility] CATH LAB;  Service: Cardiovascular;  Laterality: N/A;  . MULTIPLE EXTRACTIONS WITH ALVEOLOPLASTY Bilateral 01/24/2016   Procedure: MULTIPLE EXTRACTION WITH ALVEOLOPLASTY BILATERAL;  Surgeon: Diona Browner, DDS;  Location: [place];  Service: Oral Surgery;  Laterality: Bilateral;  . MULTIPLE TOOTH  EXTRACTIONS  01/24/2016   MULTIPLE EXTRACTION WITH ALVEOLOPLASTY BILATERAL (Bilateral)    Prior to Admission medications   Medication Sig Start Date End Date Taking? Authorizing Provider  amiodarone (PACERONE) 200 MG tablet take 1 tablet by mouth once daily Patient taking differently: take 200 mg by mouth once daily 07/22/16  Yes Jule Ser, DO  carvedilol (COREG) 12.5 MG tablet take 1 tablet by mouth every morning Patient taking differently: take 12.5 mg by mouth every morning 09/18/16  Yes Jule Ser, DO  colchicine 0.6 MG tablet Take 1 tablet (0.6 mg total) by mouth daily. 11/26/16  Yes Jule Ser, DO  febuxostat (ULORIC) 40 MG tablet Take 1 tablet (40 mg total) by mouth daily. 11/26/16  Yes Jule Ser, DO  folic acid (FOLVITE) 1 MG tablet take 1 tablet by mouth once daily Patient taking differently: take 1 mg by mouth once daily 07/15/16  Yes Jule Ser, DO  furosemide (LASIX) 40 MG tablet Take 1 tablet (40 mg total) by mouth daily. 01/19/17  Yes Jule Ser, DO  hydroxyurea (HYDREA) 500 MG capsule Take 2 capsules (1,000 mg total) by mouth daily. May take with food to minimize GI side effects. 11/26/16  Yes Heath Lark, MD  SUBOXONE 8-2 MG FILM Place 1 Film under the tongue daily.  08/21/15  Yes [provider]  tiotropium (SPIRIVA HANDIHALER) 18 MCG inhalation capsule Place 1 capsule (18 mcg total) into inhaler and inhale daily. PLACE ON HOLD UNTIL PATIENT REQUESTS TO FILL 09/27/15  Yes Juluis Mire, MD  warfarin (COUMADIN) 2.5 MG tablet Take one-half tablet on Wednesdays and Fridays; all other days--take one (1) tablet by mouth daily. Patient taking differently: Take 1.25 mg by mouth daily on Tuesday, Thursday and Saturday. Take 2.5 mg by mouth daily on all other days. 12/21/16  Yes Aldine Contes, MD  albuterol (PROAIR HFA) 108 (90 Base) MCG/ACT inhaler Inhale 1-2 puffs into the lungs every 6 (six) hours as needed for wheezing or shortness of breath. PLACE ON  HOLD UNTIL PATIENT REQUESTS TO FILL Patient taking differently: Inhale 1-2 puffs into the lungs every 6 (six) hours as needed for wheezing or shortness of breath.  09/27/15   Juluis Mire, MD  fluticasone (FLOVENT HFA) 44 MCG/ACT inhaler Inhale 2 puffs into the lungs 2 (two) times daily.    [provider]    Current Facility-Administered Medications  Medication Dose Route Frequency Provider Last Rate Last Dose  . 0.9 %  sodium chloride infusion   Intravenous Continuous Jule Ser, DO 100 mL/hr at 02/15/17 1527    . [START ON 02/16/2017] amiodarone (PACERONE) tablet 200 mg  200 mg Oral Daily Jule Ser, DO      . budesonide (PULMICORT) nebulizer solution 0.25 mg  0.25 mg Nebulization BID Jule Ser, DO   0.25 mg at 02/15/17 1500  . [START ON 02/16/2017] buprenorphine (SUBUTEX) SL tablet 8 mg  8 mg Sublingual Daily Jule Ser, DO      . [START ON 02/16/2017] colchicine tablet 0.6 mg  0.6 mg Oral Daily Jule Ser, DO      . [  START ON 02/16/2017] febuxostat (ULORIC) tablet 40 mg  40 mg Oral Daily Jule Ser, DO      . [START ON 01/18/6377] folic acid (FOLVITE) tablet 1 mg  1 mg Oral Daily Jule Ser, DO      . [START ON 02/16/2017] hydroxyurea (HYDREA) capsule 1,000 mg  1,000 mg Oral Daily Jule Ser, DO      . potassium chloride SA (K-DUR,KLOR-CON) CR tablet 40 mEq  40 mEq Oral BID Jule Ser, DO   40 mEq at 02/15/17 1527  . sodium chloride flush (NS) 0.9 % injection 3 mL  3 mL Intravenous Q12H Jule Ser, DO      . tiotropium [facility]) inhalation capsule 18 mcg  18 mcg Inhalation Daily Jule Ser, DO        Allergies as of 02/15/2017  . (No Known Allergies)    Family History  Problem Relation Age of Onset  . Diabetes Mother   . Hypertension Mother   . Cirrhosis Father   . Alcohol abuse Father   . Colon cancer Neg Hx   . Rectal cancer Neg Hx   . Stomach cancer Neg Hx     Social History   Social History  . Marital status:  Single    Spouse name: N/A  . Number of children: N/A  . Years of education: N/A   Occupational History  . Retired    Social History Main Topics  . Smoking status: Current Every Day Smoker    Packs/day: 0.20    Years: 40.00    Types: Cigarettes  . Smokeless tobacco: Never Used     Comment: patient smoked 1-2 cigs x 50 years, now smoking approx 4-5 cigs per day x 1 year  . Alcohol use 0.0 oz/week     Comment: beer occasionally  . Drug use: Yes    Types: "Crack" cocaine, Cocaine, Heroin     Comment: 03/20/2013 "been clean for awhile"; endorses using heroin "all my life" up until about a year ago-- currently denies any drug use-- on suboxone.   . Sexual activity: Not Currently   Other Topics Concern  . Not on file   Social History Narrative   Moved here from [place]. Lives with common law wife and grandchildren. He is retired from "general labor."    Review of Systems: All systems reviewed and negative except where noted in HPI.  Physical Exam: Vital signs in last 24 hours: Temp:  [98 F (36.7 C)] 98 F (36.7 C) (07/30 1236) Pulse Rate:  [50-96] 50 (07/30 1236) Resp:  [18] 18 (07/30 1236) BP: (102-132)/(61-64) 102/61 (07/30 1236) SpO2:  [96 %] 96 % (07/30 1236) Weight:  [206 lb 9.6 oz (93.7 kg)-213 lb 6.5 oz (96.8 kg)] 213 lb 6.5 oz (96.8 kg) (07/30 1236) Last BM Date: 02/15/17 General:   Alert, obese male in NAD Psych:  Pleasant, cooperative. Normal mood and affect. Eyes:  Pupils equal, sclera clear, no icterus.   Conjunctiva pink. Ears:  Normal auditory acuity. Nose:  No deformity, discharge,  or lesions. Neck:  Supple; no masses Lungs:  Clear throughout to auscultation.   No wheezes, crackles, or rhonchi.  Heart:  Regular rate and rhythm Abdomen:  Soft, obese, nontender, BS active, no palp mass    Rectal:  Scant red blood in vault. No external lesions  Msk:  Symmetrical without gross deformities. . Pulses:  Normal pulses noted. Neurologic:  Alert and  oriented  x4;  grossly normal neurologically. Skin:  Intact without significant lesions  or rashes..   Intake/Output from previous day: No intake/output data recorded. Intake/Output this shift: No intake/output data recorded.  Lab Results:  Recent Labs  02/15/17 0909  WBC 5.7  HGB 15.5  HCT 43.3  PLT 164   BMET  Recent Labs  02/15/17 0909  NA 141  K 3.0*  CL 99*  CO2 30  GLUCOSE 115*  BUN 20  CREATININE 1.50*  CALCIUM 8.8*   PT/INR  Recent Labs  02/15/17 0904  INR 2.1    Tye Savoy, NP-C @  02/15/2017, 3:39 PM  Pager number [phone]

## 2017-02-15 NOTE — [place] Notes (Signed)
Internal Medicine Clinic Attending  Case discussed with Dr. Blum at the time of the visit.  We reviewed the resident's history and exam and pertinent patient test results.  I agree with the assessment, diagnosis, and plan of care documented in the resident's note. 

## 2017-02-15 NOTE — [place] Notes (Signed)
   CC: nausea, bright red blood per rectum   HPI:  [name] is a 70 y.o. with PMH as listed below who presented to coumadin clinic this morning and reported nausea and blood per rectum at that time so she was sent to the acute care clinic. Please see the assessment and plans for the status of the patient chronic medical problems.    Past Medical History:  Diagnosis Date  . Atrial fibrillation /flutter    hx of flutter ablation 2/12  . Cancer ([place])   . Cardiomyopathy    EF55% 11/14<<35%   . CHF (congestive heart failure) ([place])   . COPD (chronic obstructive pulmonary disease) ([place])   . GERD (gastroesophageal reflux disease)   . Hepatitis    "think I had that once; a long time ago; from dirty needles I think" (12/14/2012)  . Hypertension   . Leukocytosis, unspecified 06/12/2013  . Myeloproliferative neoplasm ([place]) 08/15/2013  . Obesity   . Pneumonia   . Primary polycythemia ([place]) 06/12/2013  . Renal insufficiency   . Shortness of breath    "all the time lately" (12/14/2012)  . Substance abuse    alcohol  . Syncope    Review of Systems:  Refer to history of present illness and assessment and plans for pertinent review of systems, all others reviewed and negative  Physical Exam:  Vitals:   02/15/17 0903  BP: 132/64  Pulse: 96  SpO2: 96%  Weight: 206 lb 9.6 oz (93.7 kg)   Physical Exam  Constitutional: He is well-developed, well-nourished, and in no distress. No distress.  HENT:  Moist mucous membranes   Cardiovascular: Normal rate and regular rhythm.   No murmur heard. Pulmonary/Chest: Effort normal. No respiratory distress. He has no wheezes. He has no rales.  Abdominal: Soft. He exhibits distension. There is no tenderness.  Skin: He is diaphoretic.     Assessment & Plan:   Bright red blood per rectum  Acute kidney injury  Patient presented to coumadin clinic this morning and reported painless rectal bleeding for the past week. He has had 2  bowel movements per day accompanied by bright red blood. Two days ago this became associated with nausea, he was not previously having vomiting but did begin vomiting during his office visit today. The vomiting was non bloody non bilious. Rectal bleeding is also associated with SOB but he denied lightheadedness until he began vomiting. He is on coumadin for Afib, denies NSAID use and drinks one shot of vodka every other night. At last colonoscopy in February 2016 he had 2 tubulous adenomas without high grade dysplasia or malignancy and he was recommended for follow-up in 5 years. - orthostatics today- negative  - BMP, CBC, and Troponin - troponin negative, Hgb 15.5   Creatinine is elevated and he has low potassium consistent with possible dehydration. Given his nausea and now vomiting I do not think he will be able to handle fluid repletion by po at home so he will be admitted to the teaching service for fluid repletion and CBC monitoring.    See Encounters Tab for problem based charting.  Patient discussed with Dr. Lynnae January

## 2017-02-15 NOTE — H&P (Signed)
Date: 02/15/2017               Patient Name:  [name] MRN: [id number]  DOB: 20-Nov-1945 Age / Sex: 71 y.o., male   PCP: Jule Ser, DO         Medical Service: Internal Medicine Teaching Service         Attending Physician: Dr. Aldine Contes, MD    First Contact: Dr. Tarri Abernethy Pager: [phone]  Second Contact: Dr. Marlowe Sax Pager: [phone]       After Hours (After 5p/  First Contact Pager: [phone]  weekends / holidays): Second Contact Pager: [phone]   Chief Complaint: Bloody stool  History of Present Illness:  This is a 71 y.o. man with PMHx significant for atrial fibrillation on Coumadin s/p ILR, COPD, polycythemia vera, HTN, HFpEF, history of prior heroin use, ongoing tobacco use, EtOH use who presents as a direct admit from clinic due to GI bleeding.    Patient was seen for his scheduled coumadin visit but when he noted that he has been having bloody bowel movements and not feeling well he was sent to the [facility] and then subsequently admitted.  Patient reports symptoms began 1 week ago with 2-3, bright red bloody bowel movements per day.  He denies black, tarry stools.  He has been compliant with his coumadin.  He reports no prior history of GI bleeding.  About 2 days ago he developed abdominal pain, left-sided, cramping in nature, off and on for 1 to 2 seconds at time before easing off.  He cannot recall anything that makes this better or worse.  He reported nausea last evening and had an episode of non-bloody, non-bilious emesis while in clinic.  He currently states that his abdominal pain and nausea is better.  His last bloody BM was this morning but he thinks it was less bloody.  He thinks the abdominal pain was related to the rice and beans he ate a couple days ago.  He denies fever, chills, CP, any worse SOB than his baseline, lightheadedness, dizziness, dysuria, LE edema.  Labs obtained in the clinic showed INR 2.1, CBC with WBC 5.7, Hgb 15,  HCT 43 and MCV 115.  Troponin was negative.  BMET with Na 141, K 3.0, Cl 99, CO2 30, Gluc 115, Creat 1.50, BUN 20.  He was hemodynamically stable, breathing comfortably on room air.  ORthostatic vitals were negative.  Last colonoscopy was Feb 2016 with Dr. Fuller Plan.  He had 2 tubulous adenomas without high grade dysplasias or malignancy.  Recommended for follow up in 5 years.  He reports drinking liquor daily, smokes 1-2 cigarettes per day.  States he does not use NSAIDs.  Has been abstinent from drugs.  Meds:  Albuterol PRN Amiodarone Coreg Colchcine Febuxostat Flovent Folic acid Lasix Hydrea Suboxone Spiriva Coumadin  Allergies: Allergies as of 02/15/2017  . (No Known Allergies)   Past Medical History:  Diagnosis Date  . Atrial fibrillation /flutter    hx of flutter ablation 2/12  . Cancer ([place])   . Cardiomyopathy    EF55% 11/14<<35%   . CHF (congestive heart failure) ([place])   . COPD (chronic obstructive pulmonary disease) ([place])   . GERD (gastroesophageal reflux disease)   . Hepatitis    "think I had that once; a long time ago; from dirty needles I think" (12/14/2012)  . Hypertension   . Leukocytosis, unspecified 06/12/2013  . 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 Obesity   . Pneumonia   .  Primary polycythemia ([place]) 06/12/2013  . Renal insufficiency   . Shortness of breath    "all the time lately" (12/14/2012)  . Substance abuse    alcohol  . Syncope     Family History  Problem Relation Age of Onset  . Diabetes Mother   . Hypertension Mother   . Cirrhosis Father   . Alcohol abuse Father   . Colon cancer Neg Hx   . Rectal cancer Neg Hx   . Stomach cancer Neg Hx      Social History   Social History  . Marital status: Single    Spouse name: N/A  . Number of children: N/A  . Years of education: N/A   Occupational History  . Retired    Social History Main Topics  . Smoking status: Current Every Day Smoker    Packs/day: 0.20    Years: 40.00     Types: Cigarettes  . Smokeless tobacco: Never Used     Comment: patient smoked 1-2 cigs x 50 years, now smoking approx 4-5 cigs per day x 1 year  . Alcohol use 0.0 oz/week     Comment: beer occasionally  . Drug use: Yes    Types: "Crack" cocaine, Cocaine, Heroin     Comment: 03/20/2013 "been clean for awhile"; endorses using heroin "all my life" up until about a year ago-- currently denies any drug use-- on suboxone.   . Sexual activity: Not Currently   Other Topics Concern  . Not on file   Social History Narrative   Moved here from [place]. Lives with common law wife and grandchildren. He is retired from "general labor."     Review of Systems: A complete ROS was negative except as per HPI.   Physical Exam: Blood pressure 102/61, pulse (!) 50, temperature 98 F (36.7 C), temperature source Oral, resp. rate 18, height 5\' 6"  (1.676 m), weight 213 lb 6.5 oz (96.8 kg), SpO2 96 %. Physical Exam  Constitutional: He is oriented to person, place, and time.  Very pleasant man, sitting up in chair, attempting to get IV placed, no acute distress.  HENT:  Head: Normocephalic and atraumatic.  Eyes: Conjunctivae and EOM are normal.  Neck: Normal range of motion. No JVD present.  Cardiovascular: Normal rate, regular rhythm and normal heart sounds.   Pulmonary/Chest: No respiratory distress. He has wheezes.  He has mild expiratory wheezes.  Abdominal: Soft. Bowel sounds are normal. He exhibits no distension. There is no tenderness. There is no rebound and no guarding.  His abdomen is obese.  Musculoskeletal: He exhibits no edema.  Neurological: He is alert and oriented to person, place, and time.  Skin: Skin is warm and dry.  Psychiatric: Mood and affect normal.     Assessment & Plan by Problem:  GI Bleed Presenting with report of 1 week of bright red bloody bowel movements occurring 2-3 times per day in the setting of coumadin use for atrial fibrillation.  Last colonoscopy in Feb 2016  with 2 tubulous adenomas removed.  Suspicious for lower GI source given description.  BUN is normal.  Currently HD stable. - Consult to GI appreciate recommendations - IV fluid hydration - Maintain IV access - PPI 40mg  IV daily - Hold Anticoagulation - Monitor INR - Clear liquids for now, NPO at MN pending GI evaluation - Hold Lasix - FOBT pending - CBC Q12H  Elevated Creatinine Hypokalemia - Likely secondary to nausea, vomiting, decreased PO intake, and volume loss from GI bleed. - Replete  electrolytes - IV fluids - Follow BMET  Atrial Fibrillation s/p ablation and ILR: currently in sinus rhythm and appropriately beta blocked.  CHA2DS2-VASc Score and unadjusted Ischemic Stroke Rate (% per year) is equal to 3.2 % stroke rate/year from a score of 3. - Hold Coumadin - Continue amiodarone for rhythm control - Hold Coreg for rate control.  He is only taking daily and took dose this morning.  Gout He is on Febuxostat 40 mg daily as well as colchicine until his uric acid level is less than 6. - Continue home meds - Check uric acid.  If less than 6, discontinue colchicine.  If not at goal, consider titrating febuxostat.  HFpEF: Last echo on 06/07/14 with grade 1 diastolic dysfunction, EF 76-73%.  He is on Lasix 40mg  daily.  Appears euvolemic on exam today. - Hold Lasix in setting of GI bleed and giving fluids.  Monitor volume status. 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  At home on SL suboxone daily. - Continue Subutex 8mg  daily  FEN Fluids:  Electrolytes: Replace K, check magnesium Nutrition: clears, NPO at MN  DVT PPx: SCD  CODE: FULL  Dispo: Admit patient to Inpatient with expected length of stay greater than 2  midnights.  SignedJule Ser, DO 02/15/2017, 12:50 PM  Pager: [phone]

## 2017-02-15 NOTE — Assessment & Plan Note (Signed)
Patient presented to coumadin clinic this morning and reported painless rectal bleeding for the past week. He has had 2 bowel movements per day accompanied by bright red blood. Two days ago this became associated with nausea, he was not previously having vomiting but did begin vomiting during his office visit today. The vomiting was non bloody non bilious. Rectal bleeding is also associated with SOB but he denied lightheadedness until he began vomiting. He is on coumadin for Afib, denies NSAID use and drinks one shot of vodka every other night. At last colonoscopy in February 2016 he had 2 tubulous adenomas without high grade dysplasia or malignancy and he was recommended for follow-up in 5 years. - orthostatics today- negative  - BMP, CBC, and Troponin - troponin negative, Hgb 15.5

## 2017-02-15 NOTE — Progress Notes (Signed)
Anticoagulation Management [name] is a 71 y.o. male who reports to the clinic for monitoring of warfarin treatment.    Indication: atrial fibrillation  Duration: indefinite Supervising physician: [name] Clinic Visit History: Patient does report signs/symptoms of bleeding or thromboembolism (See patient findings section).  Other recent changes: No diet, medications, lifestyle changes endorsed by the patient.  Anticoagulation Episode Summary    Current INR goal:   2.0-3.0  TTR:   69.8 % (4.3 y)  Next INR check:   03/15/2017  INR from last check:   2.1 (02/15/2017)  Weekly max warfarin dose:     Target end date:   Indefinite  INR check location:   [facility]  Preferred lab:     Send INR reminders to:      Indications   Atrial fibrillation ([place]) [I48.91]       Comments:          ASSESSMENT Recent Results: The most recent result is correlated with 13.75 mg per week: Lab Results  Component Value Date   INR 2.1 02/15/2017   INR 3.00 01/18/2017   INR 2.20 12/21/2016    Anticoagulation Dosing: INR as of 02/15/2017 and Previous Warfarin Dosing Information    INR Dt INR Goal [name] [name] [name] [name] [name] [name] [name] [name]   02/15/2017 2.1 2.0-3.0 13.75 mg 2.5 mg 2.5 mg 1.25 mg 2.5 mg 1.25 mg 2.5 mg 1.25 mg    Previous description   Take 1/2 tablet on Tuesdays, Thursdays and Saturdays;  all other days--take 1 tablet.    Anticoagulation Warfarin Dose Instructions as of 02/15/2017      Total [name] [name] [name] [name] [name] [name] [name]   New Dose 13.75 mg 2.5 mg 2.5 mg 1.25 mg 2.5 mg 1.25 mg 2.5 mg 1.25 mg     (2.5 mg x 1)  (2.5 mg x 1)  (2.5 mg x 0.5)  (2.5 mg x 1)  (2.5 mg x 0.5)  (2.5 mg x 1)  (2.5 mg x 0.5)                         Description   Take 1/2 tablet on Tuesdays, Thursdays and Saturdays;  all other days--take 1 tablet.      INR today: Therapeutic  PLAN Weekly dose was unchanged.  Patient Instructions  Patient instructed to take  medications as defined in the Anti-coagulation Track section of this encounter.  Patient instructed to take today's dose.  Patient instructed to take 1/2 tablet on Tuesdays, Thursdays and Saturdays;  all other days--take 1 tablet.  Patient verbalized understanding of these instructions.     Patient advised to contact clinic or seek medical attention if signs/symptoms of bleeding or thromboembolism occur.  Patient verbalized understanding by repeating back information and was advised to contact me if further medication-related questions arise. Patient was also provided an information handout.  Follow-up Return in 4 weeks (on 03/15/2017) for Follow up INR at 0845h.  Caryl Bis PharmD, CACP, CPP  15 minutes spent face-to-face with the patient during the encounter. 50% of time spent on education. 50% of time was spent on fingerstick, point of care INR sample collection, processing, resulting, interpretation, discussion with the patient and documentation in EPIC/CHL and [email or web address]

## 2017-02-15 NOTE — Assessment & Plan Note (Signed)
Creatinine is elevated and he has low potassium consistent with possible dehydration. Given his nausea and now vomiting I do not think he will be able to handle fluid repletion by po at home so he will be admitted to the teaching service for fluid repletion and CBC monitoring.

## 2017-02-16 DIAGNOSIS — Z7901 Long term (current) use of anticoagulants: Secondary | ICD-10-CM

## 2017-02-16 DIAGNOSIS — M109 Gout, unspecified: Secondary | ICD-10-CM | POA: Diagnosis not present

## 2017-02-16 DIAGNOSIS — Z79899 Other long term (current) drug therapy: Secondary | ICD-10-CM

## 2017-02-16 DIAGNOSIS — J449 Chronic obstructive pulmonary disease, unspecified: Secondary | ICD-10-CM | POA: Diagnosis not present

## 2017-02-16 DIAGNOSIS — D45 Polycythemia vera: Secondary | ICD-10-CM | POA: Diagnosis not present

## 2017-02-16 DIAGNOSIS — I4891 Unspecified atrial fibrillation: Secondary | ICD-10-CM | POA: Diagnosis not present

## 2017-02-16 DIAGNOSIS — K648 Other hemorrhoids: Secondary | ICD-10-CM | POA: Diagnosis not present

## 2017-02-16 DIAGNOSIS — K920 Hematemesis: Secondary | ICD-10-CM | POA: Diagnosis not present

## 2017-02-16 DIAGNOSIS — K922 Gastrointestinal hemorrhage, unspecified: Secondary | ICD-10-CM

## 2017-02-16 DIAGNOSIS — I5030 Unspecified diastolic (congestive) heart failure: Secondary | ICD-10-CM | POA: Diagnosis not present

## 2017-02-16 DIAGNOSIS — I10 Essential (primary) hypertension: Secondary | ICD-10-CM | POA: Diagnosis not present

## 2017-02-16 LAB — RETICULOCYTES
RBC.: 3.48 MIL/uL — AB (ref 4.22–5.81)
RETIC COUNT ABSOLUTE: 83.5 10*3/uL (ref 19.0–186.0)
RETIC CT PCT: 2.4 % (ref 0.4–3.1)

## 2017-02-16 LAB — IRON AND TIBC
Iron: 123 ug/dL (ref 45–182)
SATURATION RATIOS: 46 % — AB (ref 17.9–39.5)
TIBC: 270 ug/dL (ref 250–450)
UIBC: 147 ug/dL

## 2017-02-16 LAB — VITAMIN B12: VITAMIN B 12: 234 pg/mL (ref 180–914)

## 2017-02-16 LAB — PROTIME-INR
INR: 2.52
PROTHROMBIN TIME: 27.7 s — AB (ref 11.4–15.2)

## 2017-02-16 LAB — BASIC METABOLIC PANEL
Anion gap: 9 (ref 5–15)
BUN: 21 mg/dL — AB (ref 6–20)
CALCIUM: 7.7 mg/dL — AB (ref 8.9–10.3)
CHLORIDE: 101 mmol/L (ref 101–111)
CO2: 27 mmol/L (ref 22–32)
CREATININE: 1.38 mg/dL — AB (ref 0.61–1.24)
GFR calc non Af Amer: 51 mL/min — ABNORMAL LOW (ref >60)
GFR, EST AFRICAN AMERICAN: 59 mL/min — AB (ref >60)
Glucose, Bld: 87 mg/dL (ref 65–99)
Potassium: 3.6 mmol/L (ref 3.5–5.1)
SODIUM: 137 mmol/L (ref 135–145)

## 2017-02-16 LAB — CBC
HCT: 37.1 % — ABNORMAL LOW (ref 39.0–52.0)
HEMATOCRIT: 40.4 % (ref 39.0–52.0)
HEMOGLOBIN: 14.3 g/dL (ref 13.0–17.0)
Hemoglobin: 12.9 g/dL — ABNORMAL LOW (ref 13.0–17.0)
MCH: 40.1 pg — AB (ref 26.0–34.0)
MCH: 41.1 pg — AB (ref 26.0–34.0)
MCHC: 34.8 g/dL (ref 30.0–36.0)
MCHC: 35.4 g/dL (ref 30.0–36.0)
MCV: 115.2 fL — AB (ref 78.0–100.0)
MCV: 116.1 fL — AB (ref 78.0–100.0)
Platelets: 134 10*3/uL — ABNORMAL LOW (ref 150–400)
Platelets: 154 10*3/uL (ref 150–400)
RBC: 3.22 MIL/uL — AB (ref 4.22–5.81)
RBC: 3.48 MIL/uL — ABNORMAL LOW (ref 4.22–5.81)
RDW: 13.1 % (ref 11.5–15.5)
RDW: 13.2 % (ref 11.5–15.5)
WBC: 4 10*3/uL (ref 4.0–10.5)
WBC: 4.9 10*3/uL (ref 4.0–10.5)

## 2017-02-16 LAB — URIC ACID: Uric Acid, Serum: 6.1 mg/dL (ref 4.4–7.6)

## 2017-02-16 LAB — FOLATE: Folate: 44.7 ng/mL (ref >5.9)

## 2017-02-16 LAB — GLUCOSE, CAPILLARY
GLUCOSE-CAPILLARY: 121 mg/dL — AB (ref 65–99)
Glucose-Capillary: 138 mg/dL — ABNORMAL HIGH (ref 65–99)

## 2017-02-16 LAB — FERRITIN: FERRITIN: 278 ng/mL (ref 24–336)

## 2017-02-16 MED ORDER — HYDROCORTISONE ACETATE 25 MG RE SUPP
25.0000 mg | Freq: Every day | RECTAL | 0 refills | Status: DC
Start: 2017-02-16 — End: 2018-02-07

## 2017-02-16 NOTE — Progress Notes (Signed)
   Subjective: Mr. [name] is doing well this AM with no recurrence in bright red blood per rectum. He has been able to tolerate oral intake without any further N/V. Discussed that GI believes the BRBPR is due to hemorrhoids, recommending no further work-up, and discharge with Anusol suppository + benefiber TID with meals. His Hgb has dropped over the interval however, we discussed that this is likely dilutional. Discussed the plan for discharge with follow-up as outpatient. He is agreeable with no questions or concerns.   Objective: Vital signs in last 24 hours: Vitals:   02/15/17 2135 02/16/17 0421 02/16/17 0857 02/16/17 1000  BP: 125/63 124/67  (!) 114/57  Pulse: 64 61  (!) 52  Resp: 17 18  18   Temp: 98.1 F (36.7 C) 98 F (36.7 C)  98.4 F (36.9 C)  TempSrc: Oral Oral  Oral  SpO2: 98% 95% 98% 98%  Weight: 213 lb 6.5 oz (96.8 kg)     Height:       Physical Exam  Constitutional: He is oriented to person, place, and time. He appears well-developed and well-nourished. No distress.  HENT:  Head: Normocephalic and atraumatic.  Eyes: Pupils are equal, round, and reactive to light. Conjunctivae are normal.  Cardiovascular: Normal rate, regular rhythm, normal heart sounds and intact distal pulses.   Pulmonary/Chest: Effort normal and breath sounds normal. No respiratory distress. He has no wheezes. He has no rales.  Abdominal: Soft. Bowel sounds are normal. He exhibits no distension. There is no tenderness. There is no guarding.  Musculoskeletal: He exhibits no edema.  Neurological: He is alert and oriented to person, place, and time.  Skin: Skin is warm and dry.   Assessment/Plan: Active Problems:   GI bleed  1. GI Bleed, likely 2/2 Hemorrhoids  - BRBPR on warfarin for A-fib - Hgb 12.9 this AM down from 15.5, believe this is a dilutional drop as WBC, HCT, and Plt all dropped as well. Baseline Hgb near 12.0-13.0 - No bowel movements overnight  - GI recommending no further evaluation,  discharge with Anusol suppository + benefiber TID with meals, and follow-up - Will recheck CBC this afternoon, if stable will discharge  - Resume warfarin on discharge with instructions to follow-up if any further events  2. A-fib s/p ablation  - Holding warfarin, but will resume on DC  3. Macrocytosis and thrombocytopenia  - GI ordered abdominal US and checking B12, Folate, and Iron studies  - Patient is on Hydroxyurea which may explain both lab findings   4. Gout - Uric acid 6.1 - Discontinue Colchicine and continue febuxostat  5. HFpEF - Currently holding lasix, will continue on discharge.   AKI. Improving  Hypokalemia. Resolved  Dispo: Anticipated discharge in approximately 0 day(s).   Ina Homes, MD 02/16/2017, 11:27 AM My Pager: [phone]

## 2017-02-16 NOTE — Discharge Instructions (Signed)
Please follow-up with GI as an outpatient. Their office should contact you.   Please stop taking your Colchicine.   Please follow up with your primary care doctor if you have more blood in your stool.   Anemia, Nonspecific Anemia is a condition in which the concentration of red blood cells or hemoglobin in the blood is below normal. Hemoglobin is a substance in red blood cells that carries oxygen to the tissues of the body. Anemia results in not enough oxygen reaching these tissues. What are the causes? Common causes of anemia include:  Excessive bleeding. Bleeding may be internal or external. This includes excessive bleeding from periods (in women) or from the intestine.  Poor nutrition.  Chronic kidney, thyroid, and liver disease.  Bone marrow disorders that decrease red blood cell production.  Cancer and treatments for cancer.  HIV, AIDS, and their treatments.  Spleen problems that increase red blood cell destruction.  Blood disorders.  Excess destruction of red blood cells due to infection, medicines, and autoimmune disorders.  What are the signs or symptoms?  Minor weakness.  Dizziness.  Headache.  Palpitations.  Shortness of breath, especially with exercise.  Paleness.  Cold sensitivity.  Indigestion.  Nausea.  Difficulty sleeping.  Difficulty concentrating. Symptoms may occur suddenly or they may develop slowly. How is this diagnosed? Additional blood tests are often needed. These help your health care provider determine the best treatment. Your health care provider will check your stool for blood and look for other causes of blood loss. How is this treated? Treatment varies depending on the cause of the anemia. Treatment can include:  Supplements of iron, vitamin I94, or folic acid.  Hormone medicines.  A blood transfusion. This may be needed if blood loss is severe.  Hospitalization. This may be needed if there is significant continual blood  loss.  Dietary changes.  Spleen removal.  Follow these instructions at home: Keep all follow-up appointments. It often takes many weeks to correct anemia, and having your health care provider check on your condition and your response to treatment is very important. Get help right away if:  You develop extreme weakness, shortness of breath, or chest pain.  You become dizzy or have trouble concentrating.  You develop heavy vaginal bleeding.  You develop a rash.  You have bloody or black, tarry stools.  You faint.  You vomit up blood.  You vomit repeatedly.  You have abdominal pain.  You have a fever or persistent symptoms for more than 2-3 days.  You have a fever and your symptoms suddenly get worse.  You are dehydrated. This information is not intended to replace advice given to you by your health care provider. Make sure you discuss any questions you have with your health care provider. Document Released: 08/13/2004 Document Revised: 12/18/2015 Document Reviewed: 12/30/2012 Elsevier Interactive Patient Education  2017 [employer].  ------------------------------------------------------------------------------------------------------------------------------------------------------- Information on my medicine - Coumadin   (Warfarin)  This medication education was reviewed with me or my healthcare representative as part of my discharge preparation.    Why was Coumadin prescribed for you? Coumadin was prescribed for you because you have a blood clot or a medical condition that can cause an increased risk of forming blood clots. Blood clots can cause serious health problems by blocking the flow of blood to the heart, lung, or brain. Coumadin can prevent harmful blood clots from forming. As a reminder your indication for Coumadin is:   Stroke Prevention Because Of Atrial Fibrillation  What test  will check on my response to Coumadin? While on Coumadin (warfarin) you will  need to have an INR test regularly to ensure that your dose is keeping you in the desired range. The INR (international normalized ratio) number is calculated from the result of the laboratory test called prothrombin time (PT).  If an INR APPOINTMENT HAS NOT ALREADY BEEN MADE FOR YOU please schedule an appointment to have this lab work done by your health care provider within 7 days. Your INR goal is usually a number between:  2 to 3 or your provider may give you a more narrow range like 2-2.5.  Ask your health care provider during an office visit what your goal INR is.  What  do you need to  know  About  COUMADIN? Take Coumadin (warfarin) exactly as prescribed by your healthcare provider about the same time each day.  DO NOT stop taking without talking to the doctor who prescribed the medication.  Stopping without other blood clot prevention medication to take the place of Coumadin may increase your risk of developing a new clot or stroke.  Get refills before you run out.  What do you do if you miss a dose? If you miss a dose, take it as soon as you remember on the same day then continue your regularly scheduled regimen the next day.  Do not take two doses of Coumadin at the same time.  Important Safety Information A possible side effect of Coumadin (Warfarin) is an increased risk of bleeding. You should call your healthcare provider right away if you experience any of the following: ? Bleeding from an injury or your nose that does not stop. ? Unusual colored urine (red or dark brown) or unusual colored stools (red or black). ? Unusual bruising for unknown reasons. ? A serious fall or if you hit your head (even if there is no bleeding).  Some foods or medicines interact with Coumadin (warfarin) and might alter your response to warfarin. To help avoid this: ? Eat a balanced diet, maintaining a consistent amount of Vitamin K. ? Notify your provider about major diet changes you plan to  make. ? Avoid alcohol or limit your intake to 1 drink for women and 2 drinks for men per day. (1 drink is 5 oz. wine, 12 oz. beer, or 1.5 oz. liquor.)  Make sure that ANY health care provider who prescribes medication for you knows that you are taking Coumadin (warfarin).  Also make sure the healthcare provider who is monitoring your Coumadin knows when you have started a new medication including herbals and non-prescription products.  Coumadin (Warfarin)  Major Drug Interactions  Increased Warfarin Effect Decreased Warfarin Effect  Alcohol (large quantities) Antibiotics (esp. Septra/Bactrim, Flagyl, Cipro) Amiodarone (Cordarone) Aspirin (ASA) Cimetidine (Tagamet) Megestrol (Megace) NSAIDs (ibuprofen, naproxen, etc.) Piroxicam (Feldene) Propafenone (Rythmol SR) Propranolol (Inderal) Isoniazid (INH) Posaconazole (Noxafil) Barbiturates (Phenobarbital) Carbamazepine (Tegretol) Chlordiazepoxide (Librium) Cholestyramine (Questran) Griseofulvin Oral Contraceptives Rifampin Sucralfate (Carafate) Vitamin K   Coumadin (Warfarin) Major Herbal Interactions  Increased Warfarin Effect Decreased Warfarin Effect  Garlic Ginseng Ginkgo biloba Coenzyme Q10 Green tea St. Johns wort    Coumadin (Warfarin) FOOD Interactions  Eat a consistent number of servings per week of foods HIGH in Vitamin K (1 serving =  cup)  Collards (cooked, or boiled & drained) Kale (cooked, or boiled & drained) Mustard greens (cooked, or boiled & drained) Parsley *serving size only =  cup Spinach (cooked, or boiled & drained) Swiss chard (cooked, or boiled &  drained) Turnip greens (cooked, or boiled & drained)  Eat a consistent number of servings per week of foods MEDIUM-HIGH in Vitamin K (1 serving = 1 cup)  Asparagus (cooked, or boiled & drained) Broccoli (cooked, boiled & drained, or raw & chopped) Brussel sprouts (cooked, or boiled & drained) *serving size only =  cup Lettuce, raw (green leaf,  endive, romaine) Spinach, raw Turnip greens, raw & chopped   These websites have more information on Coumadin (warfarin):  FailFactory.se; VeganReport.com.au;

## 2017-02-16 NOTE — Progress Notes (Signed)
     [facility] Progress Note  Chief Complaint:    Rectal bleeding  Subjective: feels okay today. No BMs so no rectal bleeding since I saw him yesterday  Objective:  Vital signs in last 24 hours: Temp:  [98 F (36.7 C)-98.2 F (36.8 C)] 98 F (36.7 C) (07/31 0421) Pulse Rate:  [50-64] 61 (07/31 0421) Resp:  [17-18] 18 (07/31 0421) BP: (102-125)/(61-67) 124/67 (07/31 0421) SpO2:  [95 %-98 %] 98 % (07/31 0857) Weight:  [213 lb 6.5 oz (96.8 kg)] 213 lb 6.5 oz (96.8 kg) (07/30 2135) Last BM Date: 02/15/17 General:   Alert, obese male in NAD EENT:  Normal hearing, non icteric sclera, conjunctive pink.  Heart:  Regular rate and rhythm, no lower extremity edema Pulm: Normal respiratory effort. Abdomen:  Soft, obese, nontender.  Normal bowel sounds    Neurologic:  Alert and  oriented x4;  grossly normal neurologically. Psych:  Pleasant, cooperative.  Normal mood and affect.   Intake/Output from previous day: 07/30 0701 - 07/31 0700 In: 2265 [P.O.:810; I.V.:1455] Out: 152 [Urine:152] Intake/Output this shift: No intake/output data recorded.  Lab Results:  Recent Labs  02/15/17 0909 02/15/17 1832 02/16/17 0446  WBC 5.7 5.4 4.0  HGB 15.5 14.2 12.9*  HCT 43.3 40.2 37.1*  PLT 164 162 134*   BMET  Recent Labs  02/15/17 0909 02/16/17 0446  NA 141 137  K 3.0* 3.6  CL 99* 101  CO2 30 27  GLUCOSE 115* 87  BUN 20 21*  CREATININE 1.50* 1.38*  CALCIUM 8.8* 7.7*    PT/INR  Recent Labs  02/15/17 0904 02/16/17 0446  LABPROT  --  27.7*  INR 2.1 2.52    ASSESSMENT / PLAN:   Painless rectal bleeding x several days on coumadin. Anusol started empirically yesterday for ? internal hemorrhoids. No BMs or bleeding since yesterday. Hgb 12.9 today ~ 5am, down from baseline of 15. He has been getting IVF at 172ml an hour so some of the drop could be dilutional.  -recheck CBC later today -monitor for ongoing bleeding -continue anusol  Thrombocytopenia,  macrocytosis. Findings could be secondary to ETOH /chronic liver disease should be excluded. Normal TSH in May -obtain ultrasound abd -b12, folate, iron studies  AKI, ? Superimposed on CKD. Cr improved with IVF  Hx of COPD, ongoing tobacco use.   AFIB, coumadin on hold  Active Problems:   GI bleed    LOS: 1 day   Tye Savoy ,NP 02/16/2017, 9:37 AM  Pager number [phone]   Attending physician's note   I have taken an interval history, reviewed the chart and examined the patient. I agree with the Advanced Practitioner's note, impression and recommendations.   Damaris Hippo, MD [phone] Mon-Fri 8a-5p [phone] after 5p, weekends, holidays

## 2017-02-16 NOTE — Progress Notes (Signed)
  Date: 02/16/2017  Patient name: [name] [facility]  Medical record number: [id number]  Date of birth: 08/07/1945   I have seen and evaluated [name] and discussed their care with the Residency Team. In brief, patient is a 71 year old male with past medical history of atrial fibrillation on Coumadin, COPD, polycythemia vera, hypertension, chronic diastolic heart failure who presented with bright red blood in the stools over the last week.  Patient has been on Coumadin chronically for his atrial fibrillation but states he noted bloody bowel movements over the last week. Patient states he has 2-3 episodes of bright red blood per rectum per day usually mixed with his stool. No melena, no lightheadedness, no syncope. Approximately 2 days prior to admission he developed left-sided abdominal pain which is crampy in nature, intermittent, lasting seconds at a time before resolving. Patient went to the internal medicine clinic yesterday and had his Coumadin level checked which was therapeutic. No fevers or chills, no shortness of breath, no chest pain, no palpitations, no diaphoresis, no focal weakness, no headache, no blurry vision. While in the clinic he had one episode of nausea and vomiting and was admitted for further workup.  Today patient feels well with no further episodes of bleeding. No other complaints at this time.  PMHx, Fam Hx, and/or Soc Hx : As per resident admit note  Vitals:   02/16/17 0421 02/16/17 1000  BP: 124/67 (!) 114/57  Pulse: 61 (!) 52  Resp: 18 18  Temp: 98 F (36.7 C) 98.4 F (36.9 C)   Gen.: Awake alert oriented 3, NAD CVS: Regular rate and rhythm, normal heart sounds Lungs: CTA bilaterally Abdomen: Soft, nontender, nondistended, normoactive bowel sounds Extremities: No edema noted  Assessment and Plan: I have seen and evaluated the patient as outlined above. I agree with the formulated Assessment and Plan as detailed in the residents' note,  with the following changes:   1. Bright red blood per rectum: - Patient presented to the internal medicine clinic with a one-week history of bright red blood per rectum in the setting of being on Coumadin for history of A. fib with therapeutic INR. Patient states this is now resolved. Patient was noted to have a mildly decreased hemoglobin to 12 today which is concerning given his history of a GI bleed but repeat hemoglobin has remained stable (increased to 14) - GI follow-up and recommendations appreciated - No need for colonoscopy at this time per GI. Suspect bleeding secondary to internal hemorrhoids. - Continue with Anusol suppository for now  - I suspect that the drop in hemoglobin this morning was likely dilutional from being on IV fluids and this has now stabilized (repeat CBC showed a hemoglobin of 14) - Patient is stable for discharge home today - Resume Coumadin on discharge - No further workup for now   Aldine Contes, MD 7/31/20182:29 PM

## 2017-02-16 NOTE — Care Management CC44 (Signed)
Condition Code 44 Documentation Completed  Patient Details  Name: Kairi Tufo MRN: [id number] Date of Birth: Aug 20, 1945   Condition Code 44 given:  Yes Patient signature on Condition Code 44 notice:  Yes Documentation of 2 MD's agreement:  Yes Code 44 added to claim:  Yes    Erenest Rasher, RN 02/16/2017, 3:40 PM

## 2017-02-16 NOTE — Progress Notes (Signed)
Discharge instructions and medications discussed and reviewed with pt, verbalized understanding. Telemetry dc'ed, CCMD notified. IV dc'ed, site clean and dry. Pt was escorted out of the unit in wheelchair, took all belongings with him.

## 2017-02-16 NOTE — Care Management Obs Status (Signed)
[place] NOTIFICATION   Patient Details  Name: Gery Sabedra MRN: [id number] Date of Birth: 07-Jan-1946   Medicare Observation Status Notification Given:  Yes    Erenest Rasher, RN 02/16/2017, 3:40 PM

## 2017-02-16 NOTE — Discharge Summary (Signed)
Name: [name] MRN: [id number] DOB: 1945/09/21 71 y.o. PCP: Jule Ser, DO  Date of Admission: 02/15/2017 12:25 PM Date of Discharge: 02/16/2017 Attending Physician: Aldine Contes, MD  Discharge Diagnosis: 1. GI Bleed, likely 2/2 Hemorrhoids.  2. A-Fib s/p Ablation.  3. Gout.  Active Problems:   Atrial fibrillation (HCC)   Gout   GI bleed  Discharge Medications: Allergies as of 02/16/2017   No Known Allergies     Medication List    STOP taking these medications   colchicine 0.6 MG tablet     TAKE these medications   albuterol 108 (90 Base) MCG/ACT inhaler Commonly known as:  PROAIR HFA Inhale 1-2 puffs into the lungs every 6 (six) hours as needed for wheezing or shortness of breath. PLACE ON HOLD UNTIL PATIENT REQUESTS TO FILL What changed:  additional instructions   amiodarone 200 MG tablet Commonly known as:  PACERONE take 1 tablet by mouth once daily What changed:  See the new instructions.   carvedilol 12.5 MG tablet Commonly known as:  COREG take 1 tablet by mouth every morning What changed:  See the new instructions.   febuxostat 40 MG tablet Commonly known as:  ULORIC Take 1 tablet (40 mg total) by mouth daily.   fluticasone 44 MCG/ACT inhaler Commonly known as:  FLOVENT HFA Inhale 2 puffs into the lungs 2 (two) times daily.   folic acid 1 MG tablet Commonly known as:  FOLVITE take 1 tablet by mouth once daily What changed:  See the new instructions.   furosemide 40 MG tablet Commonly known as:  LASIX Take 1 tablet (40 mg total) by mouth daily.   hydrocortisone 25 MG suppository Commonly known as:  ANUSOL-HC Place 1 suppository (25 mg total) rectally at bedtime.   hydroxyurea 500 MG capsule Commonly known as:  HYDREA Take 2 capsules (1,000 mg total) by mouth daily. May take with food to minimize GI side effects.   SUBOXONE 8-2 MG Film Generic drug:  Buprenorphine HCl-Naloxone HCl Place 1 Film under the tongue  daily.   tiotropium 18 MCG inhalation capsule Commonly known as:  SPIRIVA HANDIHALER Place 1 capsule (18 mcg total) into inhaler and inhale daily. PLACE ON HOLD UNTIL PATIENT REQUESTS TO FILL   warfarin 2.5 MG tablet Commonly known as:  COUMADIN Take one-half tablet on Wednesdays and Fridays; all other days--take one (1) tablet by mouth daily. What changed:  additional instructions       Disposition and follow-up:   [name] was discharged from [facility] in Stable condition.  At the hospital follow up visit please address:  1.   Please follow-up to ensure his bleeding has resolved.  Please consider checking a CBC Please ensure he followed up with GI  2.  Labs / imaging needed at time of follow-up: CBC  3.  Pending labs/ test needing follow-up: N/A  Follow-up Appointments: Follow-up Information    [name], NP Follow up.   Specialty:  Gastroenterology Contact information: [place] [address] [phone]        Jule Ser, DO Follow up.   Specialty:  Internal Medicine Why:  If you have any more blood in your stool Contact information: [name] [address] [phone]          Hospital Course by problem list: Active Problems:   Atrial fibrillation (HCC)   Gout   GI bleed   1. GI Bleed, likely 2/2 Hemorrhoids.  [name] is a 71 y.o. Male with a PMHx significant for A-fib on Coumadin, polycythemia vera, HFpEF, and alcohol use who presented as a direct admit from clinic due to bright red blood per rectum of 1 weeks duration. In the clinic his INR was 2.1, Hgb 15, and he was hypokalemic. His warfarin was held on admission and GI was consulted. They felt that his bright red blood per rectum was most likely secondary to internal hemorrhoids and recommended no further evaluation. GI recommended he also be discharged with Anusol suppository QHS for 7-10 days, and  suggested Benefiber 1 tablespoon TID with meals. His Hgb did drop from 15.5 to 12.9 before rebounding back to around 14. He denied any further BRBPR during his hospitalization. He will resume his warfarin after discharge and follow-up with GI as an outpatient.   2. A-Fib s/p Ablation. [name] is on warfarin therapy for his A-fib. It was temporarily held but after discussion with the patient and his PCP it was continued on discharge. He was given instructions to follow-up with his PCP if he experienced any further BRBPR.   3. Gout. [name] was admitted on Febuxostat and Colchicine. His uric acid level was checked, and found to be 6.1. Per his primary's request his Colchicine was held and his Febuxostat was continued.   Discharge Vitals:   BP (!) 114/57 (BP Location: Left Arm)   Pulse (!) 52   Temp 98.4 F (36.9 C) (Oral)   Resp 18   Ht 5\' 6"  (1.676 m)   Wt 213 lb 6.5 oz (96.8 kg)   SpO2 98%   BMI 34.44 kg/m   Discharge Instructions: Discharge Instructions    Diet - low sodium heart healthy    Complete by:  As directed    Increase activity slowly    Complete by:  As directed      Signed: Ina Homes, MD 02/16/2017, 2:11 PM   My Pager: [phone]

## 2017-02-23 ENCOUNTER — Ambulatory Visit (INDEPENDENT_AMBULATORY_CARE_PROVIDER_SITE_OTHER): Payer: Medicare Other | Admitting: Pharmacist

## 2017-02-23 DIAGNOSIS — I4891 Unspecified atrial fibrillation: Secondary | ICD-10-CM

## 2017-02-23 DIAGNOSIS — F17210 Nicotine dependence, cigarettes, uncomplicated: Secondary | ICD-10-CM

## 2017-02-23 DIAGNOSIS — Z5181 Encounter for therapeutic drug level monitoring: Secondary | ICD-10-CM | POA: Diagnosis not present

## 2017-02-23 DIAGNOSIS — Z7901 Long term (current) use of anticoagulants: Secondary | ICD-10-CM | POA: Diagnosis not present

## 2017-02-23 LAB — POCT INR: INR: 3.4

## 2017-02-23 NOTE — Patient Instructions (Signed)
Patient educated about medication as defined in this encounter and verbalized understanding by repeating back instructions provided.   

## 2017-02-23 NOTE — Progress Notes (Signed)
Anticoagulation Management [name] is a 71 y.o. male who reports to the clinic for monitoring of warfarin treatment. Patient was admitted 02/15/17 for GI bleed, hemorrhoids, for which he reports no recent worsening of symptoms.  Indication: atrial fibrillation  Duration: indefinite Supervising physician: [name] Clinic Visit History: Patient does report signs/symptoms of bleeding or thromboembolism  Anticoagulation Episode Summary    Current INR goal:   2.0-3.0  TTR:   69.8 % (4.3 y)  Next INR check:   03/08/2017  INR from last check:   3.4! (02/23/2017)  Weekly max warfarin dose:     Target end date:   Indefinite  INR check location:   C[facility]in Clinic  Preferred lab:     Send INR reminders to:      Indications   Atrial fibrillation (HCC) [I48.91]       Comments:          No Known Allergies Medication Sig  albuterol (PROAIR HFA) 108 (90 Base) MCG/ACT inhaler Inhale 1-2 puffs into the lungs every 6 (six) hours as needed for wheezing or shortness of breath. PLACE ON HOLD UNTIL PATIENT REQUESTS TO FILL Patient taking differently: Inhale 1-2 puffs into the lungs every 6 (six) hours as needed for wheezing or shortness of breath.   amiodarone (PACERONE) 200 MG tablet take 1 tablet by mouth once daily Patient taking differently: take 200 mg by mouth once daily  carvedilol (COREG) 12.5 MG tablet take 1 tablet by mouth every morning Patient taking differently: take 12.5 mg by mouth every morning  febuxostat (ULORIC) 40 MG tablet Take 1 tablet (40 mg total) by mouth daily.  fluticasone (FLOVENT HFA) 44 MCG/ACT inhaler Inhale 2 puffs into the lungs 2 (two) times daily.  folic acid (FOLVITE) 1 MG tablet take 1 tablet by mouth once daily Patient taking differently: take 1 mg by mouth once daily  furosemide (LASIX) 40 MG tablet Take 1 tablet (40 mg total) by mouth daily.  hydrocortisone (ANUSOL-HC) 25 MG suppository Place 1 suppository (25 mg  total) rectally at bedtime.  hydroxyurea (HYDREA) 500 MG capsule Take 2 capsules (1,000 mg total) by mouth daily. May take with food to minimize GI side effects.  SUBOXONE 8-2 MG FILM Place 1 Film under the tongue daily.   tiotropium (SPIRIVA HANDIHALER) 18 MCG inhalation capsule Place 1 capsule (18 mcg total) into inhaler and inhale daily. PLACE ON HOLD UNTIL PATIENT REQUESTS TO FILL  warfarin (COUMADIN) 2.5 MG tablet Take one-half tablet on Wednesdays and Fridays; all other days--take one (1) tablet by mouth daily. Patient taking differently: Take 1.25 mg by mouth daily on Tuesday, Thursday and Saturday. Take 2.5 mg by mouth daily on all other days.   Past Medical History:  Diagnosis Date  . Acute lower GI bleeding 02/15/2017  . Atrial fibrillation /flutter    hx of flutter ablation 2/12  . Cardiomyopathy    EF55% 11/14<<35%   . CHF (congestive heart failure) ([place])   . COPD (chronic obstructive pulmonary disease) ([place])   . GERD (gastroesophageal reflux disease)   . Gout   . Hepatitis    "think I had that once; a long time ago; from dirty needles I think" (12/14/2012)  . Hypertension   . Leukocytosis, unspecified 06/12/2013  . 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 Obesity   . Pneumonia X 1  . Primary polycythemia ([place]) 06/12/2013  . Renal insufficiency   . Shortness of breath    "all the time lately" (12/14/2012)  .  Substance abuse    alcohol; hx cocaine, heroin, crack use  . Syncope    Social History   Social History  . Marital status: Single    Spouse name: N/A  . Number of children: N/A  . Years of education: N/A   Occupational History  . Retired    Social History Main Topics  . Smoking status: Current Every Day Smoker    Packs/day: 0.10    Years: 50.00    Types: Cigarettes  . Smokeless tobacco: Never Used  . Alcohol use 53.4 oz/week    12 Cans of beer, 77 Shots of liquor per week     Comment: 02/15/2017 "a pint of vodka/day" plus 12 pack of beer/week"   . Drug use: Yes    Types: "Crack" cocaine, Cocaine, Heroin     Comment: 02/15/2017 "last used crack 4-5 years ago; last used heroin a couple years ago; currently denies any drug use-- on suboxone.   . Sexual activity: Not Currently   Other Topics Concern  . Not on file   Social History Narrative   Moved here from [place]. Lives with common law wife and grandchildren. He is retired from "general labor."   Family History  Problem Relation Age of Onset  . Diabetes Mother   . Hypertension Mother   . Cirrhosis Father   . Alcohol abuse Father   . Colon cancer Neg Hx   . Rectal cancer Neg Hx   . Stomach cancer Neg Hx    ASSESSMENT Recent Results: Lab Results  Component Value Date   INR 3.4 02/23/2017   INR 2.52 02/16/2017   INR 2.1 02/15/2017   Anticoagulation Dosing: INR as of 02/23/2017 and Previous Warfarin Dosing Information    INR Dt INR Goal [name] [name]   02/23/2017 3.4 2.0-3.0 13.75 mg 2.5 mg 2.5 mg 1.25 mg 2.5 mg 1.25 mg 2.5 mg 1.25 mg    Previous description   Take 1/2 tablet on Tuesdays, Thursdays and Saturdays;  all other days--take 1 tablet.    Anticoagulation Warfarin Dose Instructions as of 02/23/2017      Total [name]   New Dose 12.5 mg 1.25 mg 2.5 mg 1.25 mg 1.25 mg 2.5 mg 1.25 mg 2.5 mg     (2.5 mg x 0.5)  (2.5 mg x 1)  (2.5 mg x 0.5)  (2.5 mg x 0.5)  (2.5 mg x 1)  (2.5 mg x 0.5)  (2.5 mg x 1)                           INR today: Supratherapeutic  PLAN Weekly dose was decreased by 9%  Patient Instructions  Patient educated about medication as defined in this encounter and verbalized understanding by repeating back instructions provided.   Patient advised to contact clinic or seek medical attention if signs/symptoms of bleeding or thromboembolism occur.  Patient verbalized understanding by repeating back information and was advised to contact me if further medication-related questions arise. Patient was  also provided an information handout.  Follow-up 03/08/17  [name]

## 2017-02-26 ENCOUNTER — Ambulatory Visit: Payer: Medicare Other | Admitting: Hematology and Oncology

## 2017-02-26 ENCOUNTER — Other Ambulatory Visit: Payer: Self-pay | Admitting: Hematology and Oncology

## 2017-02-26 ENCOUNTER — Other Ambulatory Visit: Payer: Medicare Other

## 2017-02-26 DIAGNOSIS — D45 Polycythemia vera: Secondary | ICD-10-CM

## 2017-03-01 ENCOUNTER — Telehealth: Payer: Self-pay | Admitting: Hematology and Oncology

## 2017-03-01 NOTE — Telephone Encounter (Signed)
Rescheduled patients appointment and spoke with her on the phone and gave her the new times and dates.

## 2017-03-02 ENCOUNTER — Telehealth: Payer: Self-pay

## 2017-03-02 ENCOUNTER — Ambulatory Visit (INDEPENDENT_AMBULATORY_CARE_PROVIDER_SITE_OTHER): Payer: Medicare Other | Admitting: Nurse Practitioner

## 2017-03-02 ENCOUNTER — Encounter: Payer: Self-pay | Admitting: Nurse Practitioner

## 2017-03-02 VITALS — BP 104/60 | HR 64 | Ht 63.75 in | Wt 208.5 lb

## 2017-03-02 DIAGNOSIS — Z7901 Long term (current) use of anticoagulants: Secondary | ICD-10-CM

## 2017-03-02 DIAGNOSIS — D696 Thrombocytopenia, unspecified: Secondary | ICD-10-CM | POA: Diagnosis not present

## 2017-03-02 DIAGNOSIS — K625 Hemorrhage of anus and rectum: Secondary | ICD-10-CM

## 2017-03-02 DIAGNOSIS — K648 Other hemorrhoids: Secondary | ICD-10-CM | POA: Diagnosis not present

## 2017-03-02 NOTE — Progress Notes (Signed)
     HPI: Patient is a 71 yo male known to Dr. Fuller Plan for hx of adenomatous colon polyps. We recently saw him in the hospital for evaluation of rectal bleeding on coumadin. Bleeding was low volume, hgb remained stable. Patient's platelets were low, there was concern for underlying liver disease. Liver-spleen scan ordered but then we decided to do it outpatient as patient was stable for discharge. Patient here now for hospital follow up. He continues to have painless rectal bleeding with BMs. He isn't constipation, denies straining.     Past Medical History:  Diagnosis Date  . Acute lower GI bleeding 02/15/2017  . Atrial fibrillation /flutter    hx of flutter ablation 2/12  . Cardiomyopathy    EF55% 11/14<<35%   . CHF (congestive heart failure) ([place])   . COPD (chronic obstructive pulmonary disease) ([place])   . GERD (gastroesophageal reflux disease)   . Gout   . Hepatitis    "think I had that once; a long time ago; from dirty needles I think" (12/14/2012)  . Hypertension   . Leukocytosis, unspecified 06/12/2013  . 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 Obesity   . Pneumonia X 1  . Primary polycythemia ([place]) 06/12/2013  . Renal insufficiency   . Shortness of breath    "all the time lately" (12/14/2012)  . Substance abuse    alcohol; hx cocaine, heroin, crack use  . Syncope     Patient's surgical history, family medical history, social history, medications and allergies were all reviewed in Epic    Physical Exam: BP 104/60 (BP Location: Left Arm, Patient Position: Sitting, Cuff Size: Normal)   Pulse 64   Ht 5' 3.75" (1.619 m) Comment: height measured without shoes  Wt 208 lb 8 oz (94.6 kg)   BMI 36.07 kg/m   GENERAL: overweight male in NAD.  PSYCH: :Pleasant, cooperative, normal affect EENT:  conjunctiva pink, mucous membranes moist, neck supple without masses CARDIAC:  RRR, no murmur heard,  No peripheral edema PULM: Normal respiratory effort, lungs CTA bilaterally,  no wheezing ABDOMEN:  soft, nontender, nondistended, no obvious masses,  normal bowel sounds RETAL: no external lesions. On anoscopy he has internal hemorrhoids SKIN:  turgor, no lesions seen Musculoskeletal:  Normal muscle tone, normal strength NEURO: Alert and oriented x 3, no focal neurologic deficits  ASSESSMENT and PLAN:  1. Pleasant 71 year old recently hospitalized with low volume painless rectal bleeding on coumadin.   -persistent low volume bleeding with BMs. Using Anusol. QHS. He has internal hemorrhoids on anoscopic exam.  -We discussed hemorrhoidal banding. Hhe would like to proceed. Will arrange for peri-procedure holding of coumadin with Dr. Carlean Purl -await clearance from Cardiology about holding coumadin  Generally hold 2-3 days prior to procedure and 7 days afterwards.   2. Hx of adenomatous colon polyps. Due for surveillance colonoscopy in 2021.  3. Chronic thrombocytopenia/ macrocytosis. Normal TSH. Need to evaluate for underlying chronic liver disease. .   -Obtain liver spleen scan  4. COPD   Tye Savoy , NP 03/02/2017, 8:54 AM Cc: Silvano Rusk, MD

## 2017-03-02 NOTE — Telephone Encounter (Signed)
[facility] [address] [place], [place]  [address] Phone:  [phone]   Fax:  [phone]  03/02/2017   RE:      [name] DOB:   20-Dec-1945 MRN:   [id number]   Dear Dr. Burnice Logan,    We have scheduled the above patient for a hemorrhoid banding procedure. Our records show that he is on anticoagulation therapy.   Please advise if okay for patient to come off his therapy of Coumadin 3 days prior to the procedure, which is scheduled for 03/17/18 and to stay off Coumadin an additional 4 days.  Please fax back/ or route the completed form to [facility] [place] at [phone].   Sincerely,    Thurmon Fair

## 2017-03-02 NOTE — Patient Instructions (Signed)
If you are age 70 or older, your body mass index should be between 23-30. Your Body mass index is 36.07 kg/m. If this is out of the aforementioned range listed, please consider follow up with your Primary Care Provider.  If you are age 49 or younger, your body mass index should be between 19-25. Your Body mass index is 36.07 kg/m. If this is out of the aformentioned range listed, please consider follow up with your Primary Care Provider.   You have been scheduled for Liver/spleen scan at [facility] (1st floor of hospital) on 03/05/17 at 10 00 am. Please arrive 15 minutes prior to your appointment for registration. Should you need to reschedule your appointment, please contact radiology at [phone]. This test typically takes about 30 minutes to perform.  You will be contacted by our office prior to your procedure for directions on holding your Coumadin.  If you do not hear from our office 1 week prior to your scheduled procedure, please call [phone] to discuss.    You have been scheduled for a hemorrhoid banding on 03/17/17 at 3 pm with Dr. Silvano Rusk.  Banding information given to patient.  Thank you for choosing me and [facility].   Tye Savoy, NP

## 2017-03-03 NOTE — Telephone Encounter (Signed)
OK to hold anticoagulation 3 days prior to procedure PK

## 2017-03-04 NOTE — Telephone Encounter (Signed)
Spoke with patient today regarding holding his Coumadin 3 days prior to hemorrhoidal banding - Okay per Dr.Kwiatkowski.  ( Dr. Carlean Purl aware that 4 additional days were not mentioned by Dr. Burnice Logan)  [name], [place]

## 2017-03-05 ENCOUNTER — Encounter (HOSPITAL_COMMUNITY): Payer: Medicare Other

## 2017-03-05 NOTE — Progress Notes (Signed)
Reviewed and agree with initial management plan.  Baylen Dea T. Konstantina Nachreiner, MD FACG 

## 2017-03-15 ENCOUNTER — Ambulatory Visit (INDEPENDENT_AMBULATORY_CARE_PROVIDER_SITE_OTHER): Payer: Medicare Other | Admitting: Pharmacist

## 2017-03-15 ENCOUNTER — Telehealth: Payer: Self-pay

## 2017-03-15 DIAGNOSIS — I4891 Unspecified atrial fibrillation: Secondary | ICD-10-CM

## 2017-03-15 DIAGNOSIS — Z7901 Long term (current) use of anticoagulants: Secondary | ICD-10-CM | POA: Diagnosis not present

## 2017-03-15 LAB — POCT INR: INR: 1.3

## 2017-03-15 NOTE — Progress Notes (Signed)
Anticoagulation Management [name] is a 71 y.o. male who reports to the clinic for monitoring of warfarin treatment.    Indication: atrial fibrillation   Duration: indefinite Supervising physician: Joni Reining  Anticoagulation Clinic Visit History: Patient does report signs/symptoms of bleeding or thromboembolism : See patient findings. Other recent changes: No diet, medications, lifestyle changes endorsed by the patient to me.  Anticoagulation Episode Summary    Current INR goal:   2.0-3.0  TTR:   69.5 % (4.3 y)  Next INR check:   03/24/2017  INR from last check:   1.30! (03/15/2017)  Weekly max warfarin dose:     Target end date:   Indefinite  INR check location:   [facility]  Preferred lab:     Send INR reminders to:      Indications   Atrial fibrillation ([place]) [I48.91]       Comments:           No Known Allergies Prior to Admission medications   Medication Sig Start Date End Date Taking? Authorizing Provider  albuterol (PROAIR HFA) 108 (90 Base) MCG/ACT inhaler Inhale 1-2 puffs into the lungs every 6 (six) hours as needed for wheezing or shortness of breath. PLACE ON HOLD UNTIL PATIENT REQUESTS TO FILL Patient taking differently: Inhale 1-2 puffs into the lungs every 6 (six) hours as needed for wheezing or shortness of breath.  09/27/15  Yes Juluis Mire, MD  amiodarone (PACERONE) 200 MG tablet take 1 tablet by mouth once daily Patient taking differently: take 200 mg by mouth once daily 07/22/16  Yes Jule Ser, DO  carvedilol (COREG) 12.5 MG tablet take 1 tablet by mouth every morning Patient taking differently: take 12.5 mg by mouth every morning 09/18/16  Yes Jule Ser, DO  febuxostat (ULORIC) 40 MG tablet Take 1 tablet (40 mg total) by mouth daily. 11/26/16  Yes Jule Ser, DO  fluticasone (FLOVENT HFA) 44 MCG/ACT inhaler Inhale 2 puffs into the lungs 2 (two) times daily.   Yes [provider]  folic acid (FOLVITE) 1 MG tablet  take 1 tablet by mouth once daily Patient taking differently: take 1 mg by mouth once daily 07/15/16  Yes Jule Ser, DO  furosemide (LASIX) 40 MG tablet Take 1 tablet (40 mg total) by mouth daily. 01/19/17  Yes Jule Ser, DO  hydrocortisone (ANUSOL-HC) 25 MG suppository Place 1 suppository (25 mg total) rectally at bedtime. 02/16/17  Yes Helberg, Larkin Ina, MD  hydroxyurea (HYDREA) 500 MG capsule Take 2 capsules (1,000 mg total) by mouth daily. May take with food to minimize GI side effects. 11/26/16  Yes Heath Lark, MD  SUBOXONE 8-2 MG FILM Place 1 Film under the tongue daily.  08/21/15  Yes [provider]  tiotropium (SPIRIVA HANDIHALER) 18 MCG inhalation capsule Place 1 capsule (18 mcg total) into inhaler and inhale daily. PLACE ON HOLD UNTIL PATIENT REQUESTS TO FILL 09/27/15  Yes Juluis Mire, MD  warfarin (COUMADIN) 2.5 MG tablet Take one-half tablet on Wednesdays and Fridays; all other days--take one (1) tablet by mouth daily. Patient taking differently: Take 1.25 mg by mouth daily on Tuesday, Thursday and Saturday. Take 2.5 mg by mouth daily on all other days. 12/21/16  Yes Aldine Contes, MD   Past Medical History:  Diagnosis Date  . Acute lower GI bleeding 02/15/2017  . Atrial fibrillation /flutter    hx of flutter ablation 2/12  . Cardiomyopathy    EF55% 11/14<<35%   . CHF (congestive heart failure) ([place])   .  COPD (chronic obstructive pulmonary disease) ([place])   . GERD (gastroesophageal reflux disease)   . Gout   . Hepatitis    "think I had that once; a long time ago; from dirty needles I think" (12/14/2012)  . Hypertension   . Leukocytosis, unspecified 06/12/2013  . 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 Obesity   . Pneumonia X 1  . Primary polycythemia ([place]) 06/12/2013  . Renal insufficiency   . Shortness of breath    "all the time lately" (12/14/2012)  . Substance abuse    alcohol; hx cocaine, heroin, crack use  . Syncope    Social History    Social History  . Marital status: Single    Spouse name: N/A  . Number of children: N/A  . Years of education: N/A   Occupational History  . Retired    Social History Main Topics  . Smoking status: Current Every Day Smoker    Packs/day: 0.10    Years: 50.00    Types: Cigarettes  . Smokeless tobacco: Never Used  . Alcohol use 53.4 oz/week    12 Cans of beer, 77 Shots of liquor per week     Comment: 02/15/2017 "a pint of vodka/day" plus 12 pack of beer/week"  . Drug use: Yes    Types: "Crack" cocaine, Cocaine, Heroin     Comment: 02/15/2017 "last used crack 4-5 years ago; last used heroin a couple years ago; currently denies any drug use-- on suboxone.   . Sexual activity: Not Currently   Other Topics Concern  . Not on file   Social History Narrative   Moved here from [place]. Lives with common law wife and grandchildren. He is retired from "general labor."   Family History  Problem Relation Age of Onset  . Diabetes Mother   . Hypertension Mother   . Cirrhosis Father   . Alcohol abuse Father   . Colon cancer Neg Hx   . Rectal cancer Neg Hx   . Stomach cancer Neg Hx     ASSESSMENT Recent Results: The most recent result is correlated with 12 mg per week: Lab Results  Component Value Date   INR 1.30 03/15/2017   INR 3.4 02/23/2017   INR 2.52 02/16/2017    Anticoagulation Dosing: INR as of 03/15/2017 and Previous Warfarin Dosing Information    INR Dt INR Goal [name] [name]   03/15/2017 1.30 2.0-3.0 12.5 mg 1.25 mg 2.5 mg 1.25 mg 1.25 mg 2.5 mg 1.25 mg 2.5 mg    Anticoagulation Warfarin Dose Instructions as of 03/15/2017      Total [name]   New Dose 7.5 mg 1.25 mg Hold Hold Hold 2.5 mg 1.25 mg 2.5 mg     (2.5 mg x 0.5)  -  -  -  (2.5 mg x 1)  (2.5 mg x 0.5)  (2.5 mg x 1)                         Description   Patient had been advised by his surgeon seeing him for procedure planned for 30-AUG-18 to OMIT/HOLD  warfarin commencing with today's dose. He will RESUME warfarin based upon instruction by his surgeon after procedure. Patient has been instructed to alternate 1 tablet (2.5mg ) with 1/2 tablet (1.25mg ) every other day upon recommencing.      INR today: Subtherapeutic  PLAN Weekly dose was none,  I.e., the dose  is being OMITTED/HELD today, tomorrow and Wednesday for planned procedure on Wednesday 29-AUG-18.   Patient Instructions  Patient instructed to take medications as defined in the Anti-coagulation Track section of this encounter.  Patient instructed to OMIT today's dose, OMIT doses on Tuesday 28-AUG-18 and Wednesday 29-AUG-18. Patient instructed to RESUME warfarin based upon instruction by his surgeon after procedure. Patient has been instructed to alternate 1 tablet (2.5mg ) with 1/2 tablet (1.25mg ) every other day upon recommencing.  Patient verbalized understanding of these instructions.     Patient advised to contact clinic or seek medical attention if signs/symptoms of bleeding or thromboembolism occur.  Patient verbalized understanding by repeating back information and was advised to contact me if further medication-related questions arise. Patient was also provided an information handout.  Follow-up Return in 9 days (on 03/24/2017) for Follow up INR at 9AM.  Ceasar Lund, Eustaquio Boyden, PharmD, CACP, CPP  15 minutes spent face-to-face with the patient during the encounter. 50% of time spent on education. 50% of time was spent on fingerstick point of care INR sample collection, processing, results interpretation, documentation in EPIC/CHL and [email or web address]

## 2017-03-15 NOTE — Patient Instructions (Signed)
Patient instructed to take medications as defined in the Anti-coagulation Track section of this encounter.  Patient instructed to OMIT today's dose, OMIT doses on Tuesday 28-AUG-18 and Wednesday 29-AUG-18. Patient instructed to RESUME warfarin based upon instruction by his surgeon after procedure. Patient has been instructed to alternate 1 tablet (2.5mg ) with 1/2 tablet (1.25mg ) every other day upon recommencing.  Patient verbalized understanding of these instructions.

## 2017-03-15 NOTE — Telephone Encounter (Signed)
Spoke with patient regarding his Lung/Spleen scan scheduled this month.  Patient states he didn't know anything about it.  I told him the instructions were on his after visit summary.  He states he has stopped his Coumadin.  I told him to restart and for him to go to [facility] on [address]. 03/18/17 at 10:45 for his scan.  I told him we would reschedule his appointment for banding after we receive scan results.  Peter Congo, [place]

## 2017-03-16 ENCOUNTER — Other Ambulatory Visit: Payer: Self-pay | Admitting: Internal Medicine

## 2017-03-16 DIAGNOSIS — I4891 Unspecified atrial fibrillation: Secondary | ICD-10-CM

## 2017-03-16 NOTE — Progress Notes (Signed)
INTERNAL MEDICINE TEACHING ATTENDING ADDENDUM - Ryan Groves, DO Duration- indefinate, Indication- a fib, INR-  Lab Results  Component Value Date   INR 1.30 03/15/2017  . Agree with pharmacy recommendations as outlined in their note.

## 2017-03-17 ENCOUNTER — Encounter: Payer: Medicare Other | Admitting: Internal Medicine

## 2017-03-18 ENCOUNTER — Encounter (HOSPITAL_COMMUNITY)
Admission: RE | Admit: 2017-03-18 | Discharge: 2017-03-18 | Disposition: A | Discharge status: Home / Self Care | Payer: Medicare Other | Source: Ambulatory Visit | Attending: Nurse Practitioner | Admitting: Nurse Practitioner

## 2017-03-18 DIAGNOSIS — D696 Thrombocytopenia, unspecified: Secondary | ICD-10-CM | POA: Diagnosis not present

## 2017-03-18 MED ORDER — TECHNETIUM TC 99M SULFUR COLLOID
5.3000 | Freq: Once | INTRAVENOUS | Status: AC | PRN
  Administered 2017-03-18: 5.3 via INTRAVENOUS

## 2017-03-18 NOTE — Telephone Encounter (Signed)
Great.  Thanks

## 2017-03-23 ENCOUNTER — Ambulatory Visit (HOSPITAL_BASED_OUTPATIENT_CLINIC_OR_DEPARTMENT_OTHER): Payer: Medicare Other | Admitting: Hematology and Oncology

## 2017-03-23 ENCOUNTER — Encounter: Payer: Self-pay | Admitting: Hematology and Oncology

## 2017-03-23 ENCOUNTER — Telehealth: Payer: Self-pay | Admitting: Hematology and Oncology

## 2017-03-23 ENCOUNTER — Other Ambulatory Visit (HOSPITAL_BASED_OUTPATIENT_CLINIC_OR_DEPARTMENT_OTHER): Payer: Medicare Other

## 2017-03-23 DIAGNOSIS — Z7901 Long term (current) use of anticoagulants: Secondary | ICD-10-CM | POA: Diagnosis not present

## 2017-03-23 DIAGNOSIS — D45 Polycythemia vera: Secondary | ICD-10-CM

## 2017-03-23 DIAGNOSIS — Z720 Tobacco use: Secondary | ICD-10-CM | POA: Diagnosis not present

## 2017-03-23 DIAGNOSIS — T50905A Adverse effect of unspecified drugs, medicaments and biological substances, initial encounter: Secondary | ICD-10-CM

## 2017-03-23 DIAGNOSIS — D6959 Other secondary thrombocytopenia: Secondary | ICD-10-CM | POA: Diagnosis not present

## 2017-03-23 LAB — CBC WITH DIFFERENTIAL/PLATELET
BASO%: 0.6 % (ref 0.0–2.0)
Basophils Absolute: 0 10*3/uL (ref 0.0–0.1)
EOS%: 2.5 % (ref 0.0–7.0)
Eosinophils Absolute: 0.1 10*3/uL (ref 0.0–0.5)
HCT: 38.4 % (ref 38.4–49.9)
HGB: 13.4 g/dL (ref 13.0–17.1)
LYMPH#: 1.2 10*3/uL (ref 0.9–3.3)
LYMPH%: 25.9 % (ref 14.0–49.0)
MCH: 42.6 pg — ABNORMAL HIGH (ref 27.2–33.4)
MCHC: 34.9 g/dL (ref 32.0–36.0)
MCV: 122.1 fL — ABNORMAL HIGH (ref 79.3–98.0)
MONO#: 0.6 10*3/uL (ref 0.1–0.9)
MONO%: 13 % (ref 0.0–14.0)
NEUT%: 58 % (ref 39.0–75.0)
NEUTROS ABS: 2.7 10*3/uL (ref 1.5–6.5)
Platelets: 139 10*3/uL — ABNORMAL LOW (ref 140–400)
RBC: 3.14 10*6/uL — AB (ref 4.20–5.82)
RDW: 14.2 % (ref 11.0–14.6)
WBC: 4.7 10*3/uL (ref 4.0–10.3)

## 2017-03-23 NOTE — Assessment & Plan Note (Signed)
The patient has JAK2 mutation positive myeloproliferative disorder. He tolerated the hydroxyurea well.  He will continue taking hydroxyurea 1000 mg daily I recommend we continue the same and I will see him back in 4 months with repeat history, physical examination and blood work

## 2017-03-23 NOTE — Assessment & Plan Note (Signed)
He has mild intermittent thrombocytopenia It could be due to hydroxyurea on alcoholism I reinforced the importance of importance of staying abstinent from drinking

## 2017-03-23 NOTE — Assessment & Plan Note (Signed)
I spent some time counseling the patient the importance of tobacco cessation. He is currently attempting to quit on his own.  

## 2017-03-23 NOTE — Progress Notes (Signed)
[facility] OFFICE PROGRESS NOTE  Patient Care Team: Jule Ser, DO as PCP - General Heath Lark, MD as Consulting Physician (Hematology and Oncology)  SUMMARY OF ONCOLOGIC HISTORY:  This is a pleasant gentleman who is being referred here because of high hemoglobin level. In November 2014, we removed one unit of blood and order an additional workup to rule out myeloproliferative disorder. Blood test result confirmed low erythropoietin level along with detectable JAK 2 mutation, confirmed myeloproliferative disorder Unfortunately, bone marrow biopsy on 06/26/2013 was nondiagnostic On 07/31/13 the patient was started on 1 hydroxyurea a day On 08/15/2013, increased hydroxyurea to 2 tablets a day and remove one more unit of blood due to the high hemoglobin On 09/05/2013, I increased hydroxyurea to 3 tablets a day and remove one more unit of blood due to high hemoglobin and hematocrit On 01/23/2014, dose of hydroxyurea is reduced to 2 tablets a day due to anemia. On 04/23/2015, dose of hydroxyurea is adjusted to 500 mg on Mondays, Wednesdays and Fridays and to take 1000 mg for the rest of the week Subsequently, he was placed on hydroxyurea 1000 mg daily.  He is placed on warfarin therapy for chronic atrial fibrillation.  INTERVAL HISTORY: Please see below for problem oriented charting. He returns for further follow-up. He was 2 hours late for his appointment today He continues to smoke intermittently He has excessive alcoholism but felt that he is ready to quit both soon He stated he is compliant taking hydroxyurea as directed. The patient denies any recent signs or symptoms of bleeding such as spontaneous epistaxis, hematuria or hematochezia.  REVIEW OF SYSTEMS:   Constitutional: Denies fevers, chills or abnormal weight loss Eyes: Denies blurriness of vision Ears, nose, mouth, throat, and face: Denies mucositis or sore throat Respiratory: Denies cough, dyspnea or  wheezes Cardiovascular: Denies palpitation, chest discomfort or lower extremity swelling Gastrointestinal:  Denies nausea, heartburn or change in bowel habits Skin: Denies abnormal skin rashes Lymphatics: Denies new lymphadenopathy or easy bruising Neurological:Denies numbness, tingling or new weaknesses Behavioral/Psych: Mood is stable, no new changes  All other systems were reviewed with the patient and are negative.  I have reviewed the past medical history, past surgical history, social history and family history with the patient and they are unchanged from previous note.  ALLERGIES:  has No Known Allergies.  MEDICATIONS:  Current Outpatient Prescriptions  Medication Sig Dispense Refill  . albuterol (PROAIR HFA) 108 (90 Base) MCG/ACT inhaler Inhale 1-2 puffs into the lungs every 6 (six) hours as needed for wheezing or shortness of breath. PLACE ON HOLD UNTIL PATIENT REQUESTS TO FILL (Patient taking differently: Inhale 1-2 puffs into the lungs every 6 (six) hours as needed for wheezing or shortness of breath. ) 18 g 3  . amiodarone (PACERONE) 200 MG tablet take 1 tablet by mouth once daily 30 tablet 6  . carvedilol (COREG) 12.5 MG tablet take 1 tablet by mouth every morning (Patient taking differently: take 12.5 mg by mouth every morning) 30 tablet 5  . febuxostat (ULORIC) 40 MG tablet Take 1 tablet (40 mg total) by mouth daily. 30 tablet 1  . fluticasone (FLOVENT HFA) 44 MCG/ACT inhaler Inhale 2 puffs into the lungs 2 (two) times daily.    . folic acid (FOLVITE) 1 MG tablet take 1 tablet by mouth once daily (Patient taking differently: take 1 mg by mouth once daily) 90 tablet 3  . furosemide (LASIX) 40 MG tablet Take 1 tablet (40 mg total) by mouth  daily. 90 tablet 3  . hydrocortisone (ANUSOL-HC) 25 MG suppository Place 1 suppository (25 mg total) rectally at bedtime. 12 suppository 0  . hydroxyurea (HYDREA) 500 MG capsule Take 2 capsules (1,000 mg total) by mouth daily. May take with  food to minimize GI side effects. 60 capsule 3  . SUBOXONE 8-2 MG FILM Place 1 Film under the tongue daily.     . tiotropium (SPIRIVA HANDIHALER) 18 MCG inhalation capsule Place 1 capsule (18 mcg total) into inhaler and inhale daily. PLACE ON HOLD UNTIL PATIENT REQUESTS TO FILL 30 capsule 11  . warfarin (COUMADIN) 2.5 MG tablet Take one-half tablet on Wednesdays and Fridays; all other days--take one (1) tablet by mouth daily. (Patient taking differently: Take 1.25 mg by mouth daily on Tuesday, Thursday and Saturday. Take 2.5 mg by mouth daily on all other days.) 30 tablet 3   No current facility-administered medications for this visit.     PHYSICAL EXAMINATION: ECOG PERFORMANCE STATUS: 1 - Symptomatic but completely ambulatory  Vitals:   03/23/17 1050  BP: (!) 125/57  Pulse: (!) 59  Resp: 18  Temp: 97.7 F (36.5 C)  SpO2: 94%   Filed Weights   03/23/17 1050  Weight: 219 lb (99.3 kg)    GENERAL:alert, no distress and comfortable.  He has poor personal hygiene, morbidly obese SKIN: skin color, texture, turgor are normal, no rashes or significant lesions EYES: normal, Conjunctiva are pink and non-injected, sclera clear OROPHARYNX:no exudate, no erythema and lips, buccal mucosa, and tongue normal  NECK: supple, thyroid normal size, non-tender, without nodularity LYMPH:  no palpable lymphadenopathy in the cervical, axillary or inguinal LUNGS: Scattered wheezes throughout. HEART: regular rate & rhythm and no murmurs and no lower extremity edema ABDOMEN:abdomen soft, non-tender and normal bowel sounds Musculoskeletal:no cyanosis of digits and no clubbing  NEURO: alert & oriented x 3 with fluent speech, no focal motor/sensory deficits  LABORATORY DATA:  I have reviewed the data as listed    Component Value Date/Time   NA 137 02/16/2017 0446   NA 142 11/26/2016 1352   NA 143 06/12/2013 1125   K 3.6 02/16/2017 0446   K 3.7 06/12/2013 1125   CL 101 02/16/2017 0446   CO2 27  02/16/2017 0446   CO2 32 (H) 06/12/2013 1125   GLUCOSE 87 02/16/2017 0446   GLUCOSE 92 06/12/2013 1125   BUN 21 (H) 02/16/2017 0446   BUN 20 11/26/2016 1352   BUN 14.6 06/12/2013 1125   CREATININE 1.38 (H) 02/16/2017 0446   CREATININE 1.05 08/30/2014 1628   CREATININE 0.9 06/12/2013 1125   CALCIUM 7.7 (L) 02/16/2017 0446   CALCIUM 8.9 06/12/2013 1125   PROT 6.8 11/26/2016 1352   PROT 6.9 06/12/2013 1125   ALBUMIN 3.8 11/26/2016 1352   ALBUMIN 3.3 (L) 06/12/2013 1125   AST 27 11/26/2016 1352   AST 21 06/12/2013 1125   ALT 34 11/26/2016 1352   ALT 28 06/12/2013 1125   ALKPHOS 102 11/26/2016 1352   ALKPHOS 112 06/12/2013 1125   BILITOT 0.4 11/26/2016 1352   BILITOT 0.82 06/12/2013 1125   GFRNONAA 51 (L) 02/16/2017 0446   GFRNONAA 73 08/30/2014 1628   GFRAA 59 (L) 02/16/2017 0446   GFRAA 84 08/30/2014 1628    No results found for: SPEP, UPEP  Lab Results  Component Value Date   WBC 4.7 03/23/2017   NEUTROABS 2.7 03/23/2017   HGB 13.4 03/23/2017   HCT 38.4 03/23/2017   MCV 122.1 (H) 03/23/2017   PLT   139 (L) 03/23/2017      Chemistry      Component Value Date/Time   NA 137 02/16/2017 0446   NA 142 11/26/2016 1352   NA 143 06/12/2013 1125   K 3.6 02/16/2017 0446   K 3.7 06/12/2013 1125   CL 101 02/16/2017 0446   CO2 27 02/16/2017 0446   CO2 32 (H) 06/12/2013 1125   BUN 21 (H) 02/16/2017 0446   BUN 20 11/26/2016 1352   BUN 14.6 06/12/2013 1125   CREATININE 1.38 (H) 02/16/2017 0446   CREATININE 1.05 08/30/2014 1628   CREATININE 0.9 06/12/2013 1125      Component Value Date/Time   CALCIUM 7.7 (L) 02/16/2017 0446   CALCIUM 8.9 06/12/2013 1125   ALKPHOS 102 11/26/2016 1352   ALKPHOS 112 06/12/2013 1125   AST 27 11/26/2016 1352   AST 21 06/12/2013 1125   ALT 34 11/26/2016 1352   ALT 28 06/12/2013 1125   BILITOT 0.4 11/26/2016 1352   BILITOT 0.82 06/12/2013 1125       RADIOGRAPHIC STUDIES: I have personally reviewed the radiological images as listed and  agreed with the findings in the report. Nm Liver Spleen Scan  Result Date: 03/18/2017 CLINICAL DATA:  Thrombocytopenia. EXAM: NUCLEAR MEDICINE LIVER - SPLEEN SCAN TECHNIQUE: Abdominal images were obtained in multiple projections after intravenous injection of radiopharmaceutical. RADIOPHARMACEUTICALS:  5.3 mCi Tc-55msulfur colloid IV COMPARISON:  None. FINDINGS: The liver spans approximately 24 cm. The length of the right lobe of liver is approximately 16.7 cm in length. No focal areas of photopenia within the liver to suggest underlying mass lesion. The spleen has a length of approximately 11 cm. No areas of accessory splenic tissue identified. IMPRESSION: 1. The liver has a liver span of approximately 24 cm. 2. Normal length of spleen. Electronically Signed   By: TKerby MoorsM.D.   On: 03/18/2017 10:40    ASSESSMENT & PLAN:  [place] The patient has JAK2 mutation positive myeloproliferative disorder. He tolerated the hydroxyurea well.  He will continue taking hydroxyurea 1000 mg daily I recommend we continue the same and I will see him back in 4 months with repeat history, physical examination and blood work   Tobacco abuse I spent some time counseling the patient the importance of tobacco cessation. He is currently attempting to quit on his own   Chronic anticoagulation The patient denies any recent signs or symptoms of bleeding such as spontaneous epistaxis, hematuria or hematochezia. he will continue current medical management. I recommend close follow-up with primary care doctor for medication adjustment.  Thrombocytopenia due to drugs He has mild intermittent thrombocytopenia It could be due to hydroxyurea on alcoholism I reinforced the importance of importance of staying abstinent from drinking   No orders of the defined types were placed in this encounter.  All questions were answered. The patient knows to call the clinic with any problems, questions or  concerns. No barriers to learning was detected. I spent 15 minutes counseling the patient face to face. The total time spent in the appointment was 20 minutes and more than 50% was on counseling and review of test results     NHeath Lark MD 03/23/2017 2:45 PM

## 2017-03-23 NOTE — Assessment & Plan Note (Signed)
The patient denies any recent signs or symptoms of bleeding such as spontaneous epistaxis, hematuria or hematochezia. he will continue current medical management. I recommend close follow-up with primary care doctor for medication adjustment.

## 2017-03-23 NOTE — Telephone Encounter (Signed)
Gave patient AVS and calendar of upcoming January appointments °

## 2017-03-24 ENCOUNTER — Ambulatory Visit (INDEPENDENT_AMBULATORY_CARE_PROVIDER_SITE_OTHER): Payer: Medicare Other | Admitting: Pharmacist

## 2017-03-24 DIAGNOSIS — I4891 Unspecified atrial fibrillation: Secondary | ICD-10-CM | POA: Diagnosis present

## 2017-03-24 DIAGNOSIS — Z7901 Long term (current) use of anticoagulants: Secondary | ICD-10-CM | POA: Diagnosis not present

## 2017-03-24 LAB — POCT INR: INR: 1.7

## 2017-03-24 MED ORDER — AMIODARONE HCL 200 MG PO TABS: 200.0000 mg | ORAL_TABLET | Freq: Every day | ORAL | 11 refills | Status: DC

## 2017-03-24 NOTE — Progress Notes (Signed)
INTERNAL MEDICINE TEACHING ATTENDING ADDENDUM - [name] M.D  Duration- indefinite, Indication- afib, INR- sub therapeutic. Agree with pharmacy recommendations as outlined in their note.      

## 2017-03-24 NOTE — Patient Instructions (Signed)
Patient educated about medication as defined in this encounter and verbalized understanding by repeating back instructions provided.   

## 2017-03-24 NOTE — Progress Notes (Addendum)
Anticoagulation Management [name] is a 71 y.o. male who reports to the clinic for monitoring of warfarin treatment.    Indication: atrial fibrillationCHA2DS2VASC score of 2, HASBLED score of 1 Duration: indefinite Supervising physician: Aldine Contes  Anticoagulation Clinic Visit History: Patient does not report signs/symptoms of bleeding or thromboembolism or any other changes  Anticoagulation Episode Summary    Current INR goal:   2.0-3.0  TTR:   69.1 % (4.4 y)  Next INR check:   03/29/2017  INR from last check:   1.7! (03/24/2017)  Weekly max warfarin dose:     Target end date:   Indefinite  INR check location:   [facility]  Preferred lab:     Send INR reminders to:      Indications   Atrial fibrillation (HCC) [I48.91]       Comments:          No Known Allergies Medication Sig  albuterol (PROAIR HFA) 108 (90 Base) MCG/ACT inhaler Inhale 1-2 puffs into the lungs every 6 (six) hours as needed for wheezing or shortness of breath. PLACE ON HOLD UNTIL PATIENT REQUESTS TO FILL Patient taking differently: Inhale 1-2 puffs into the lungs every 6 (six) hours as needed for wheezing or shortness of breath.   amiodarone (PACERONE) 200 MG tablet take 1 tablet by mouth once daily  carvedilol (COREG) 12.5 MG tablet take 1 tablet by mouth every morning Patient taking differently: take 12.5 mg by mouth every morning  febuxostat (ULORIC) 40 MG tablet Take 1 tablet (40 mg total) by mouth daily.  fluticasone (FLOVENT HFA) 44 MCG/ACT inhaler Inhale 2 puffs into the lungs 2 (two) times daily.  folic acid (FOLVITE) 1 MG tablet take 1 tablet by mouth once daily Patient taking differently: take 1 mg by mouth once daily  furosemide (LASIX) 40 MG tablet Take 1 tablet (40 mg total) by mouth daily.  hydrocortisone (ANUSOL-HC) 25 MG suppository Place 1 suppository (25 mg total) rectally at bedtime.  hydroxyurea (HYDREA) 500 MG capsule Take 2 capsules (1,000 mg total) by mouth  daily. May take with food to minimize GI side effects.  SUBOXONE 8-2 MG FILM Place 1 Film under the tongue daily.   tiotropium (SPIRIVA HANDIHALER) 18 MCG inhalation capsule Place 1 capsule (18 mcg total) into inhaler and inhale daily. PLACE ON HOLD UNTIL PATIENT REQUESTS TO FILL  warfarin (COUMADIN) 2.5 MG tablet Take one-half tablet on Wednesdays and Fridays; all other days--take one (1) tablet by mouth daily. Patient taking differently: Take 1.25 mg by mouth daily on Tuesday, Thursday and Saturday. Take 2.5 mg by mouth daily on all other days.   Past Medical History:  Diagnosis Date  . Acute lower GI bleeding 02/15/2017  . Atrial fibrillation /flutter    hx of flutter ablation 2/12  . Cardiomyopathy    EF55% 11/14<<35%   . CHF (congestive heart failure) ([place])   . COPD (chronic obstructive pulmonary disease) ([place])   . GERD (gastroesophageal reflux disease)   . Gout   . Hepatitis    "think I had that once; a long time ago; from dirty needles I think" (12/14/2012)  . Hypertension   . Leukocytosis, unspecified 06/12/2013  . 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 Obesity   . Pneumonia X 1  . Primary polycythemia ([place]) 06/12/2013  . Renal insufficiency   . Shortness of breath    "all the time lately" (12/14/2012)  . Substance abuse    alcohol; hx cocaine, heroin, crack use  .  Syncope    Social History   Social History  . Marital status: Single    Spouse name: N/A  . Number of children: N/A  . Years of education: N/A   Occupational History  . Retired    Social History Main Topics  . Smoking status: Current Every Day Smoker    Packs/day: 0.10    Years: 50.00    Types: Cigarettes  . Smokeless tobacco: Never Used  . Alcohol use 53.4 oz/week    12 Cans of beer, 77 Shots of liquor per week     Comment: 02/15/2017 "a pint of vodka/day" plus 12 pack of beer/week"  . Drug use: Yes    Types: "Crack" cocaine, Cocaine, Heroin     Comment: 02/15/2017 "last used crack 4-5  years ago; last used heroin a couple years ago; currently denies any drug use-- on suboxone.   . Sexual activity: Not Currently   Other Topics Concern  . Not on file   Social History Narrative   Moved here from [place]. Lives with common law wife and grandchildren. He is retired from "general labor."   Family History  Problem Relation Age of Onset  . Diabetes Mother   . Hypertension Mother   . Cirrhosis Father   . Alcohol abuse Father   . Colon cancer Neg Hx   . Rectal cancer Neg Hx   . Stomach cancer Neg Hx    ASSESSMENT Recent Results: Lab Results  Component Value Date   INR 1.7 03/24/2017   INR 1.30 03/15/2017   INR 3.4 02/23/2017   Anticoagulation Dosing: INR as of 03/24/2017 and Previous Warfarin Dosing Information    INR Dt INR Goal Wkly Tot [name]   03/24/2017 1.7 2.0-3.0 7.5 mg 1.25 mg 0 mg 0 mg 0 mg 2.5 mg 1.25 mg 2.5 mg    Previous description   Patient had been advised by his surgeon seeing him for procedure planned for 30-AUG-18 to OMIT/HOLD warfarin commencing with today's dose. He will RESUME warfarin based upon instruction by his surgeon after procedure. Patient has been instructed to alternate 1 tablet (2.5mg ) with 1/2 tablet (1.25mg ) every other day upon recommencing.    Anticoagulation Warfarin Dose Instructions as of 03/24/2017      Total [name]   New Dose 12.5 mg 1.25 mg 2.5 mg 1.25 mg 2.5 mg 1.25 mg 2.5 mg 1.25 mg     (2.5 mg x 0.5)  (2.5 mg x 1)  (2.5 mg x 0.5)  (2.5 mg x 1)  (2.5 mg x 0.5)  (2.5 mg x 1)  (2.5 mg x 0.5)                         Description   Patient ran out of amiodarone 3 days ago and also held warfarin last week for planned procedure which was cancelled     INR today: Subtherapeutic  PLAN Weekly dose was unchanged due to patient being out of amiodarone and holding warfarin last week. Patient will contact clinic once procedure is re-scheduled. Refilled amiodarone for him today which is  a maintenance medication and patient has been stable.  Patient Instructions  Patient educated about medication as defined in this encounter and verbalized understanding by repeating back instructions provided.   Patient advised to contact clinic or seek medical attention if signs/symptoms of bleeding or thromboembolism occur.  Patient verbalized understanding by repeating back  information and was advised to contact me if further medication-related questions arise. Patient was also provided an information handout.  Follow-up Return in about 5 days (around 03/29/2017).  Flossie Dibble

## 2017-03-25 NOTE — Telephone Encounter (Signed)
-----   Message from [name], NP sent at 03/18/2017 10:34 AM EDT ----- thanks ----- Message ----- From: Lowell Guitar, RMA Sent: 03/15/2017   2:46 PM To: [name], NP  Nevin Bloodgood, I sent you a message already on this patient.  He has been rescheduled for his scan and appointment canceled for banding.   Reminder placed in chart to stop Coumadin prior to banding appt. To be scheduled. Peter Congo, RMA ----- Message ----- From: [name], NP Sent: 03/15/2017  11:45 AM To: Gatha Mayer, MD, Lonell Face Morayati, RMA  Unfortunately he didn't show for the liver spleen scan. We will cancel the upcoming appt with you,  advise him to resume coumadin and Peter Congo will reschedule the scan for him.  Thanks ----- Message ----- From: Gatha Mayer, MD Sent: 03/15/2017   8:10 AM To: [name], NP  We need to know if they have cirrhosis - banding is contraindicated in that setting  Need to cancel appt and reschedule pending ----- Message ----- From: [name], NP Sent: 03/15/2017  12:51 AM To: Gatha Mayer, MD  Referred to you for hemor banding. Comes in this week off coumadin. No known hx of cirrhosis but platelets always on low side , Liver spleen scan ordered a couple of weeks ago but not yet done . If you are concerned about proceeding just let me know and we can wait until liver spleen scan is done.  Thanks PG

## 2017-03-25 NOTE — Telephone Encounter (Signed)
[name], pt had scan done. Is it okay to go ahead with banding with Dr. Carlean Purl? Thanks Peter Congo

## 2017-03-26 NOTE — Telephone Encounter (Signed)
Im still waiting to hear from [facility]

## 2017-03-29 ENCOUNTER — Other Ambulatory Visit: Payer: Self-pay | Admitting: *Deleted

## 2017-03-29 ENCOUNTER — Ambulatory Visit (INDEPENDENT_AMBULATORY_CARE_PROVIDER_SITE_OTHER): Payer: Medicare Other | Admitting: Pharmacist

## 2017-03-29 DIAGNOSIS — I4891 Unspecified atrial fibrillation: Secondary | ICD-10-CM

## 2017-03-29 DIAGNOSIS — Z7901 Long term (current) use of anticoagulants: Secondary | ICD-10-CM

## 2017-03-29 LAB — POCT INR: INR: 2

## 2017-03-29 NOTE — Patient Instructions (Signed)
Patient instructed to take medications as defined in the Anti-coagulation Track section of this encounter.  Patient instructed to take today's dose.  Patient instructed to take one (1) of your 2.5mg  green warfarin tablets on all days of the week--EXCEPT on Thursdays and Sundays--take only 1/2 tablet. Patient verbalized understanding of these instructions.

## 2017-03-29 NOTE — Progress Notes (Signed)
Anticoagulation Management [name] is a 71 y.o. male who reports to the clinic for monitoring of warfarin treatment.    Indication: atrial fibrillation  Duration: indefinite Supervising physician: [name]  Anticoagulation Clinic Visit History: Patient does not report signs/symptoms of bleeding or thromboembolism  Other recent changes: No diet, medications, lifestyle endorsed to me by the patient.  Anticoagulation Episode Summary    Current INR goal:   2.0-3.0  TTR:   68.9 % (4.4 y)  Next INR check:   04/19/2017  INR from last check:   2.0 (03/29/2017)  Weekly max warfarin dose:     Target end date:   Indefinite  [facility]R check [facility]   [facility]  Preferred lab:     Send INR reminders to:      Indications   Atrial fibrillation ([place]) [I48.91]       Comments:           No Known Allergies Prior to Admission medications   Medication Sig Start Date End Date Taking? Authorizing Provider  albuterol (PROAIR HFA) 108 (90 Base) MCG/ACT inhaler Inhale 1-2 puffs into the lungs every 6 (six) hours as needed for wheezing or shortness of breath. PLACE ON HOLD UNTIL PATIENT REQUESTS TO FILL Patient taking differently: Inhale 1-2 puffs into the lungs every 6 (six) hours as needed for wheezing or shortness of breath.  09/27/15  Yes Ryan Mire, MD  amiodarone (PACERONE) 200 MG tablet Take 1 tablet (200 mg total) by mouth daily. 03/24/17  Yes Ryan Ser, DO  carvedilol (COREG) 12.5 MG tablet take 1 tablet by mouth every morning Patient taking differently: take 12.5 mg by mouth every morning 09/18/16  Yes Ryan Ser, DO  febuxostat (ULORIC) 40 MG tablet Take 1 tablet (40 mg total) by mouth daily. 11/26/16  Yes Ryan Ser, DO  fluticasone (FLOVENT HFA) 44 MCG/ACT inhaler Inhale 2 puffs into the lungs 2 (two) times daily.   Yes [provider]  folic acid (FOLVITE) 1 MG tablet take 1 tablet by mouth once daily Patient taking differently: take 1 mg  by mouth once daily 07/15/16  Yes Ryan Ser, DO  furosemide (LASIX) 40 MG tablet Take 1 tablet (40 mg total) by mouth daily. 01/19/17  Yes Ryan Ser, DO  hydrocortisone (ANUSOL-HC) 25 MG suppository Place 1 suppository (25 mg total) rectally at bedtime. 02/16/17  Yes Hamilton, Ryan Ina, MD  hydroxyurea (HYDREA) 500 MG capsule Take 2 capsules (1,000 mg total) by mouth daily. May take with food to minimize GI side effects. 11/26/16  Yes Ryan Lark, MD  SUBOXONE 8-2 MG FILM Place 1 Film under the tongue daily.  08/21/15  Yes [provider]  tiotropium (SPIRIVA HANDIHALER) 18 MCG inhalation capsule Place 1 capsule (18 mcg total) into inhaler and inhale daily. PLACE ON HOLD UNTIL PATIENT REQUESTS TO FILL 09/27/15  Yes Ryan Mire, MD  warfarin (COUMADIN) 2.5 MG tablet Take one-half tablet on Wednesdays and Fridays; all other days--take one (1) tablet by mouth daily. Patient taking differently: Take 1.25 mg by mouth daily on Tuesday, Thursday and Saturday. Take 2.5 mg by mouth daily on all other days. 12/21/16  Yes Ryan Contes, MD   Past Medical History:  Diagnosis Date  . Acute lower GI bleeding 02/15/2017  . Atrial fibrillation /flutter    hx of flutter ablation 2/12  . Cardiomyopathy    EF55% 11/14<<35%   . CHF (congestive heart failure) ([place])   . COPD (chronic obstructive pulmonary disease) ([place])   . GERD (  gastroesophageal reflux disease)   . Gout   . Hepatitis    "think I had that once; a long time ago; from dirty needles I think" (12/14/2012)  . Hypertension   . Leukocytosis, unspecified 06/12/2013  . 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 Obesity   . Pneumonia X 1  . Primary polycythemia ([place]) 06/12/2013  . Renal insufficiency   . Shortness of breath    "all the time lately" (12/14/2012)  . Substance abuse    alcohol; hx cocaine, heroin, crack use  . Syncope    Social History   Social History  . Marital status: Single    Spouse name: N/A  . Number  of children: N/A  . Years of education: N/A   Occupational History  . Retired    Social History Main Topics  . Smoking status: Current Every Day Smoker    Packs/day: 0.10    Years: 50.00    Types: Cigarettes  . Smokeless tobacco: Never Used  . Alcohol use 53.4 oz/week    12 Cans of beer, 77 Shots of liquor per week     Comment: 02/15/2017 "a pint of vodka/day" plus 12 pack of beer/week"  . Drug use: Yes    Types: "Crack" cocaine, Cocaine, Heroin     Comment: 02/15/2017 "last used crack 4-5 years ago; last used heroin a couple years ago; currently denies any drug use-- on suboxone.   . Sexual activity: Not Currently   Other Topics Concern  . Not on file   Social History Narrative   Moved here from [place]. Lives with common law wife and grandchildren. He is retired from "general labor."   Family History  Problem Relation Age of Onset  . Diabetes Mother   . Hypertension Mother   . Cirrhosis Father   . Alcohol abuse Father   . Colon cancer Neg Hx   . Rectal cancer Neg Hx   . Stomach cancer Neg Hx     ASSESSMENT Recent Results: The most recent result is correlated with 12.5 mg per week: Lab Results  Component Value Date   INR 2.0 03/29/2017   INR 1.7 03/24/2017   INR 1.30 03/15/2017    Anticoagulation Dosing: INR as of 03/29/2017 and Previous Warfarin Dosing Information    INR Dt INR Goal [name] [name]   03/29/2017 2.0 2.0-3.0 12.5 mg 1.25 mg 2.5 mg 1.25 mg 2.5 mg 1.25 mg 2.5 mg 1.25 mg    Previous description   Patient ran out of amiodarone 3 days ago and also held warfarin last week for planned procedure which was cancelled   Anticoagulation Warfarin Dose Instructions as of 03/29/2017      Total [name]   New Dose 15 mg 1.25 mg 2.5 mg 2.5 mg 2.5 mg 1.25 mg 2.5 mg 2.5 mg     (2.5 mg x 0.5)  (2.5 mg x 1)  (2.5 mg x 1)  (2.5 mg x 1)  (2.5 mg x 0.5)  (2.5 mg x 1)  (2.5 mg x 1)                           INR today:  Therapeutic  PLAN Weekly dose was increased by 17% to 15 mg per week  Patient Instructions  Patient instructed to take medications as defined in the Anti-coagulation Track section of this encounter.  Patient instructed to take today's dose.  Patient instructed to take one (1) of your 2.5mg  green warfarin tablets on all days of the week--EXCEPT on Thursdays and Sundays--take only 1/2 tablet. Patient verbalized understanding of these instructions.     Patient advised to contact clinic or seek medical attention if signs/symptoms of bleeding or thromboembolism occur.  Patient verbalized understanding by repeating back information and was advised to contact me if further medication-related questions arise. Patient was also provided an information handout.  Follow-up Return in 3 weeks (on 04/19/2017) for Follow up INR at 0845h.  Pennie Banter PharmD, CACP, CPP  15 minutes spent face-to-face with the patient during the encounter. 50% of time spent on education. 50% of time was spent on point of fingerstick INR collection, processing, results and documentation in to EPIC/[facility] and [email or web address]

## 2017-03-29 NOTE — Progress Notes (Signed)
INTERNAL MEDICINE TEACHING ATTENDING ADDENDUM - Dalante Minus M.D  Duration- indefinite, Indication- afib, INR- therapeutic. Agree with pharmacy recommendations as outlined in their note.     

## 2017-03-30 MED ORDER — CARVEDILOL 12.5 MG PO TABS: 12.5000 mg | ORAL_TABLET | Freq: Every morning | ORAL | 5 refills | Status: DC

## 2017-04-19 ENCOUNTER — Ambulatory Visit (INDEPENDENT_AMBULATORY_CARE_PROVIDER_SITE_OTHER): Payer: Medicare Other | Admitting: Pharmacist

## 2017-04-19 DIAGNOSIS — Z7901 Long term (current) use of anticoagulants: Secondary | ICD-10-CM | POA: Diagnosis not present

## 2017-04-19 DIAGNOSIS — I4891 Unspecified atrial fibrillation: Secondary | ICD-10-CM

## 2017-04-19 LAB — POCT INR: INR: 1.3

## 2017-04-19 NOTE — Patient Instructions (Signed)
Patient instructed to take medications as defined in the Anti-coagulation Track section of this encounter.  Patient instructed to take today's dose.  Patient instructed to take 1 of your 2.5mg  green warfarin tablets by mouth every day EXCEPT Sunday - take 1/2 tablet on Sunday. Patient verbalized understanding of these instructions.

## 2017-04-19 NOTE — Progress Notes (Signed)
Anticoagulation Management [name] is a 71 y.o. male who reports to the clinic for monitoring of warfarin treatment.    Indication: atrial fibrillation  Duration: indefinite Supervising physician: Joni Reining  Anticoagulation Clinic Visit History: Patient does not report signs/symptoms of bleeding or thromboembolism  Other recent changes: No changes in diet, medications, lifestyle reported Anticoagulation Episode Summary    Current INR goal:   2.0-3.0  TTR:   68.0 % (4.4 y)  Next INR check:   04/26/2017  INR from last check:   1.3! (04/19/2017)  Weekly max warfarin dose:     Target end date:   Indefinite  [facility] check [facility]   [facility]  Preferred lab:     Send INR reminders to:      Indications   Atrial fibrillation ([place]) [I48.91]       Comments:           No Known Allergies Prior to Admission medications   Medication Sig Start Date End Date Taking? Authorizing Provider  albuterol (PROAIR HFA) 108 (90 Base) MCG/ACT inhaler Inhale 1-2 puffs into the lungs every 6 (six) hours as needed for wheezing or shortness of breath. PLACE ON HOLD UNTIL PATIENT REQUESTS TO FILL Patient taking differently: Inhale 1-2 puffs into the lungs every 6 (six) hours as needed for wheezing or shortness of breath.  09/27/15  Yes Juluis Mire, MD  amiodarone (PACERONE) 200 MG tablet Take 1 tablet (200 mg total) by mouth daily. 03/24/17  Yes Jule Ser, DO  carvedilol (COREG) 12.5 MG tablet Take 1 tablet (12.5 mg total) by mouth every morning. 03/30/17  Yes Jule Ser, DO  febuxostat (ULORIC) 40 MG tablet Take 1 tablet (40 mg total) by mouth daily. 11/26/16  Yes Jule Ser, DO  fluticasone (FLOVENT HFA) 44 MCG/ACT inhaler Inhale 2 puffs into the lungs 2 (two) times daily.   Yes [provider]  folic acid (FOLVITE) 1 MG tablet take 1 tablet by mouth once daily Patient taking differently: take 1 mg by mouth once daily 07/15/16  Yes Jule Ser, DO   furosemide (LASIX) 40 MG tablet Take 1 tablet (40 mg total) by mouth daily. 01/19/17  Yes Jule Ser, DO  hydrocortisone (ANUSOL-HC) 25 MG suppository Place 1 suppository (25 mg total) rectally at bedtime. 02/16/17  Yes Helberg, Larkin Ina, MD  hydroxyurea (HYDREA) 500 MG capsule Take 2 capsules (1,000 mg total) by mouth daily. May take with food to minimize GI side effects. 11/26/16  Yes Heath Lark, MD  SUBOXONE 8-2 MG FILM Place 1 Film under the tongue daily.  08/21/15  Yes [provider]  tiotropium (SPIRIVA HANDIHALER) 18 MCG inhalation capsule Place 1 capsule (18 mcg total) into inhaler and inhale daily. PLACE ON HOLD UNTIL PATIENT REQUESTS TO FILL 09/27/15  Yes Juluis Mire, MD  warfarin (COUMADIN) 2.5 MG tablet Take one-half tablet on Wednesdays and Fridays; all other days--take one (1) tablet by mouth daily. Patient taking differently: Take 1.25 mg by mouth daily on Tuesday, Thursday and Saturday. Take 2.5 mg by mouth daily on all other days. 12/21/16  Yes Aldine Contes, MD   Past Medical History:  Diagnosis Date  . Acute lower GI bleeding 02/15/2017  . Atrial fibrillation /flutter    hx of flutter ablation 2/12  . Cardiomyopathy    EF55% 11/14<<35%   . CHF (congestive heart failure) ([place])   . COPD (chronic obstructive pulmonary disease) ([place])   . GERD (gastroesophageal reflux disease)   . Gout   . Hepatitis    "  think I had that once; a long time ago; from dirty needles I think" (12/14/2012)  . Hypertension   . Leukocytosis, unspecified 06/12/2013  . 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 Obesity   . Pneumonia X 1  . Primary polycythemia ([place]) 06/12/2013  . Renal insufficiency   . Shortness of breath    "all the time lately" (12/14/2012)  . Substance abuse    alcohol; hx cocaine, heroin, crack use  . Syncope    Social History   Social History  . Marital status: Single    Spouse name: N/A  . Number of children: N/A  . Years of education: N/A    Occupational History  . Retired    Social History Main Topics  . Smoking status: Current Every Day Smoker    Packs/day: 0.10    Years: 50.00    Types: Cigarettes  . Smokeless tobacco: Never Used  . Alcohol use 53.4 oz/week    12 Cans of beer, 77 Shots of liquor per week     Comment: 02/15/2017 "a pint of vodka/day" plus 12 pack of beer/week"  . Drug use: Yes    Types: "Crack" cocaine, Cocaine, Heroin     Comment: 02/15/2017 "last used crack 4-5 years ago; last used heroin a couple years ago; currently denies any drug use-- on suboxone.   . Sexual activity: Not Currently   Other Topics Concern  . Not on file   Social History Narrative   Moved here from [place]. Lives with common law wife and grandchildren. He is retired from "general labor."   Family History  Problem Relation Age of Onset  . Diabetes Mother   . Hypertension Mother   . Cirrhosis Father   . Alcohol abuse Father   . Colon cancer Neg Hx   . Rectal cancer Neg Hx   . Stomach cancer Neg Hx     ASSESSMENT Recent Results: The most recent result is correlated with 15 mg per week: Lab Results  Component Value Date   INR 1.3 04/19/2017   INR 2.0 03/29/2017   INR 1.7 03/24/2017    Anticoagulation Dosing: INR as of 04/19/2017 and Previous Warfarin Dosing Information    INR Dt INR Goal [name] [name]   04/19/2017 1.3 2.0-3.0 15 mg 1.25 mg 2.5 mg 2.5 mg 2.5 mg 1.25 mg 2.5 mg 2.5 mg    Anticoagulation Warfarin Dose Instructions as of 04/19/2017      Total [name]   New Dose 16.25 mg 1.25 mg 2.5 mg 2.5 mg 2.5 mg 2.5 mg 2.5 mg 2.5 mg     (2.5 mg x 0.5)  (2.5 mg x 1)  (2.5 mg x 1)  (2.5 mg x 1)  (2.5 mg x 1)  (2.5 mg x 1)  (2.5 mg x 1)                         Description   Take 1 of your 2.5mg  green warfarin tablets by mouth every day EXCEPT Sunday - take 1/2 tablet on Sunday.     INR today: Subtherapeutic  PLAN Weekly dose was increased by 8% to 16.25 mg per  week  Patient Instructions  Patient instructed to take medications as defined in the Anti-coagulation Track section of this encounter.  Patient instructed to take today's dose.  Patient instructed to take 1 of your 2.5mg  green warfarin tablets by mouth every  day EXCEPT Sunday - take 1/2 tablet on Sunday. Patient verbalized understanding of these instructions.   Patient advised to contact clinic or seek medical attention if signs/symptoms of bleeding or thromboembolism occur.  Patient verbalized understanding by repeating back information and was advised to contact me if further medication-related questions arise. Patient was also provided an information handout.  Follow-up Return in 7 days (on 04/26/2017) for Follow up INR at 0900.  Mila Merry Gerarda Fraction, PharmD PGY1 Pharmacy Resident Pager: [phone]  15 minutes spent face-to-face with the patient during the encounter. 50% of time spent on education. 50% of time was spent on point of care INR fingerstick testing, results interpretation, dose adjustment, and documentation in [facility] and [email or web address]

## 2017-04-19 NOTE — Progress Notes (Signed)
INTERNAL MEDICINE TEACHING ATTENDING ADDENDUM - Lucious Groves, DO Duration- indefinate, Indication- afib, INR-  Lab Results  Component Value Date   INR 1.3 04/19/2017  . Agree with pharmacy recommendations as outlined in their note.

## 2017-04-26 ENCOUNTER — Ambulatory Visit (INDEPENDENT_AMBULATORY_CARE_PROVIDER_SITE_OTHER): Payer: Medicare Other

## 2017-04-26 DIAGNOSIS — I4891 Unspecified atrial fibrillation: Secondary | ICD-10-CM

## 2017-04-26 DIAGNOSIS — Z7901 Long term (current) use of anticoagulants: Secondary | ICD-10-CM

## 2017-04-26 LAB — POCT INR: INR: 2

## 2017-04-26 NOTE — Progress Notes (Signed)
Anticoagulation Management [name] is a 71 y.o. male who reports to the clinic for monitoring of warfarin treatment.    Indication: atrial fibrillation  Duration: indefinite Supervising physician: [facility] Visit History: Patient does not report signs/symptoms of bleeding or thromboembolism  Other recent changes: Patient denies diet, medications, lifestyle changes. Anticoagulation Episode Summary    Current INR goal:   2.0-3.0  TTR:   67.7 % (4.4 y)  Next INR check:   05/17/2017  INR from last check:   2.0 (04/26/2017)  Weekly max warfarin dose:     Target end date:   Indefinite  INR check location:   [facility]  Preferred lab:     Send INR reminders to:      Indications   Atrial fibrillation ([place]) [I48.91]       Comments:           No Known Allergies Prior to Admission medications   Medication Sig Start Date End Date Taking? Authorizing Provider  albuterol (PROAIR HFA) 108 (90 Base) MCG/ACT inhaler Inhale 1-2 puffs into the lungs every 6 (six) hours as needed for wheezing or shortness of breath. PLACE ON HOLD UNTIL PATIENT REQUESTS TO FILL Patient taking differently: Inhale 1-2 puffs into the lungs every 6 (six) hours as needed for wheezing or shortness of breath.  09/27/15  Yes Juluis Mire, MD  amiodarone (PACERONE) 200 MG tablet Take 1 tablet (200 mg total) by mouth daily. 03/24/17  Yes Jule Ser, DO  carvedilol (COREG) 12.5 MG tablet Take 1 tablet (12.5 mg total) by mouth every morning. 03/30/17  Yes Jule Ser, DO  febuxostat (ULORIC) 40 MG tablet Take 1 tablet (40 mg total) by mouth daily. 11/26/16  Yes Jule Ser, DO  fluticasone (FLOVENT HFA) 44 MCG/ACT inhaler Inhale 2 puffs into the lungs 2 (two) times daily.   Yes [provider]  folic acid (FOLVITE) 1 MG tablet take 1 tablet by mouth once daily Patient taking differently: take 1 mg by mouth once daily 07/15/16  Yes Jule Ser, DO   furosemide (LASIX) 40 MG tablet Take 1 tablet (40 mg total) by mouth daily. 01/19/17  Yes Jule Ser, DO  hydrocortisone (ANUSOL-HC) 25 MG suppository Place 1 suppository (25 mg total) rectally at bedtime. 02/16/17  Yes Helberg, Larkin Ina, MD  hydroxyurea (HYDREA) 500 MG capsule Take 2 capsules (1,000 mg total) by mouth daily. May take with food to minimize GI side effects. 11/26/16  Yes Heath Lark, MD  SUBOXONE 8-2 MG FILM Place 1 Film under the tongue daily.  08/21/15  Yes [provider]  tiotropium (SPIRIVA HANDIHALER) 18 MCG inhalation capsule Place 1 capsule (18 mcg total) into inhaler and inhale daily. PLACE ON HOLD UNTIL PATIENT REQUESTS TO FILL 09/27/15  Yes Juluis Mire, MD  warfarin (COUMADIN) 2.5 MG tablet Take one-half tablet on Wednesdays and Fridays; all other days--take one (1) tablet by mouth daily. Patient taking differently: Take 1.25 mg by mouth daily on Tuesday, Thursday and Saturday. Take 2.5 mg by mouth daily on all other days. 12/21/16  Yes Aldine Contes, MD   Past Medical History:  Diagnosis Date  . Acute lower GI bleeding 02/15/2017  . Atrial fibrillation /flutter    hx of flutter ablation 2/12  . Cardiomyopathy    EF55% 11/14<<35%   . CHF (congestive heart failure) ([place])   . COPD (chronic obstructive pulmonary disease) ([place])   . GERD (gastroesophageal reflux disease)   . Gout   . Hepatitis    "  think I had that once; a long time ago; from dirty needles I think" (12/14/2012)  . Hypertension   . Leukocytosis, unspecified 06/12/2013  . 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 Obesity   . Pneumonia X 1  . Primary polycythemia ([place]) 06/12/2013  . Renal insufficiency   . Shortness of breath    "all the time lately" (12/14/2012)  . Substance abuse    alcohol; hx cocaine, heroin, crack use  . Syncope    Social History   Social History  . Marital status: Single    Spouse name: N/A  . Number of children: N/A  . Years of education: N/A    Occupational History  . Retired    Social History Main Topics  . Smoking status: Current Every Day Smoker    Packs/day: 0.10    Years: 50.00    Types: Cigarettes  . Smokeless tobacco: Never Used  . Alcohol use 53.4 oz/week    12 Cans of beer, 77 Shots of liquor per week     Comment: 02/15/2017 "a pint of vodka/day" plus 12 pack of beer/week"  . Drug use: Yes    Types: "Crack" cocaine, Cocaine, Heroin     Comment: 02/15/2017 "last used crack 4-5 years ago; last used heroin a couple years ago; currently denies any drug use-- on suboxone.   . Sexual activity: Not Currently   Other Topics Concern  . Not on file   Social History Narrative   Moved here from [place]. Lives with common law wife and grandchildren. He is retired from "general labor."   Family History  Problem Relation Age of Onset  . Diabetes Mother   . Hypertension Mother   . Cirrhosis Father   . Alcohol abuse Father   . Colon cancer Neg Hx   . Rectal cancer Neg Hx   . Stomach cancer Neg Hx     ASSESSMENT Recent Results: The most recent result is correlated with 16.25 mg per week: Lab Results  Component Value Date   INR 2.0 04/26/2017   INR 1.3 04/19/2017   INR 2.0 03/29/2017    Anticoagulation Dosing: INR as of 04/26/2017 and Previous Warfarin Dosing Information    INR Dt INR Goal [name] [name]   04/26/2017 2.0 2.0-3.0 16.25 mg 1.25 mg 2.5 mg 2.5 mg 2.5 mg 2.5 mg 2.5 mg 2.5 mg    Previous description   Take 1 of your 2.5mg  green warfarin tablets by mouth every day EXCEPT Sunday - take 1/2 tablet on Sunday.   Anticoagulation Warfarin Dose Instructions as of 04/26/2017      Total [name]   New Dose 17.5 mg 2.5 mg 2.5 mg 2.5 mg 2.5 mg 2.5 mg 2.5 mg 2.5 mg     (2.5 mg x 1)  (2.5 mg x 1)  (2.5 mg x 1)  (2.5 mg x 1)  (2.5 mg x 1)  (2.5 mg x 1)  (2.5 mg x 1)                         Description   Take 1 of your 2.5mg  green warfarin tablets by mouth every  day.     INR today: Therapeutic  PLAN Weekly dose was increased by 9% to 17.5 mg per week  Patient Instructions  Patient instructed to take medications as defined in the Anti-coagulation Track section of this encounter.  Patient instructed to  start taking 2.5mg  daily starting tomorrow (already took today's dose).  Patient instructed to take 1 of your 2.5mg  green warfarin tablets by mouth every day. Patient verbalized understanding of these instructions.     Patient advised to contact clinic or seek medical attention if signs/symptoms of bleeding or thromboembolism occur.  Patient verbalized understanding by repeating back information and was advised to contact me if further medication-related questions arise. Patient was also provided an information handout.  Follow-up Return in about 3 weeks (around 05/17/2017) for Follow up INR at 0900.  Patterson Hammersmith PharmD PGY1 Pharmacy Practice Resident 04/26/2017 10:58 AM  15 minutes spent face-to-face with the patient during the encounter. 50% of time spent on education. 50% of time was spent on fingerstick point of care, INR sample collection, processing of results, documentation in EPIC/CHL and [email or web address]

## 2017-04-26 NOTE — Patient Instructions (Signed)
Patient instructed to take medications as defined in the Anti-coagulation Track section of this encounter.  Patient instructed to start taking 2.5mg  daily starting tomorrow (already took today's dose).  Patient instructed to take 1 of your 2.5mg  green warfarin tablets by mouth every day. Patient verbalized understanding of these instructions.

## 2017-04-27 ENCOUNTER — Telehealth: Payer: Self-pay

## 2017-04-27 ENCOUNTER — Other Ambulatory Visit: Payer: Self-pay | Admitting: Hematology and Oncology

## 2017-04-27 DIAGNOSIS — D45 Polycythemia vera: Secondary | ICD-10-CM

## 2017-04-27 NOTE — Telephone Encounter (Signed)
-----   Message from [name], NP sent at 04/21/2017  3:22 PM EDT ----- Marykay Lex, can you see if he is still having problems with rectal bleeding and if so then get him in to see me. Dr. Carlean Purl has concerns about banding him. Thanks

## 2017-04-30 NOTE — Telephone Encounter (Signed)
Documented in a phone message and letter mailed.

## 2017-04-30 NOTE — Telephone Encounter (Signed)
-----   Message from [name], NP sent at 04/29/2017  5:25 PM EDT ----- Faythe Ghee, please document in a phone not if you didn't already. Thanks ----- Message ----- From: Lowell Guitar, RMA Sent: 04/27/2017  10:02 AM To: [name], NP  Lyn Hollingshead called yesterday and left a message with family member to have patient call me.  I tried later in the day and was not able to reach patient.  I called this morning and spoke with same family member and asked to speak with patient and she stated he was not there.  I told her that I really needed to speak with patient and she hung up on me.  I will send a letter.  Just FYI. Peter Congo ----- Message ----- From: [name], NP Sent: 04/21/2017   3:22 PM To: Lonell Face Delle Andrzejewski, RMA  Hey, can you see if he is still having problems with rectal bleeding and if so then get him in to see me. Dr. Carlean Purl has concerns about banding him. Thanks

## 2017-05-17 ENCOUNTER — Ambulatory Visit (INDEPENDENT_AMBULATORY_CARE_PROVIDER_SITE_OTHER): Payer: Medicare Other | Admitting: Pharmacist

## 2017-05-17 DIAGNOSIS — Z7901 Long term (current) use of anticoagulants: Secondary | ICD-10-CM

## 2017-05-17 DIAGNOSIS — I4891 Unspecified atrial fibrillation: Secondary | ICD-10-CM | POA: Diagnosis present

## 2017-05-17 LAB — POCT INR: INR: 5.2

## 2017-05-17 NOTE — Progress Notes (Signed)
Anticoagulation Management [name] is a 71 y.o. male who reports to the clinic for monitoring of warfarin treatment.    Indication: atrial fibrillation  Duration: indefinite Supervising physician: [name]  Anticoagulation Clinic Visit History: Patient does not report signs/symptoms of bleeding or thromboembolism  Other recent changes: No changes in diet, medications, lifestyle reported Anticoagulation Episode Summary    Current INR goal:   2.0-3.0  TTR:   67.2 % (4.5 y)  Next INR check:   05/24/2017  INR from last check:   5.2! (05/17/2017)  Weekly max warfarin dose:     Target end date:   Indefinite  INR check location:   [facility]  Preferred lab:     Send INR reminders to:      Indications   Atrial fibrillation ([name]) [I48.91]       Comments:           No Known Allergies Prior to Admission medications   Medication Sig Start Date End Date Taking? Authorizing Provider  albuterol (PROAIR HFA) 108 (90 Base) MCG/ACT inhaler Inhale 1-2 puffs into the lungs every 6 (six) hours as needed for wheezing or shortness of breath. PLACE ON HOLD UNTIL PATIENT REQUESTS TO FILL Patient taking differently: Inhale 1-2 puffs into the lungs every 6 (six) hours as needed for wheezing or shortness of breath.  09/27/15   Ryan Mire, MD  amiodarone (PACERONE) 200 MG tablet Take 1 tablet (200 mg total) by mouth daily. 03/24/17   Ryan Ser, DO  carvedilol (COREG) 12.5 MG tablet Take 1 tablet (12.5 mg total) by mouth every morning. 03/30/17   Ryan Ser, DO  febuxostat (ULORIC) 40 MG tablet Take 1 tablet (40 mg total) by mouth daily. 11/26/16   Ryan Ser, DO  fluticasone (FLOVENT HFA) 44 MCG/ACT inhaler Inhale 2 puffs into the lungs 2 (two) times daily.    [provider]  folic acid (FOLVITE) 1 MG tablet take 1 tablet by mouth once daily Patient taking differently: take 1 mg by mouth once daily 07/15/16   Ryan Ser, DO  furosemide (LASIX) 40  MG tablet Take 1 tablet (40 mg total) by mouth daily. 01/19/17   Ryan Ser, DO  hydrocortisone (ANUSOL-HC) 25 MG suppository Place 1 suppository (25 mg total) rectally at bedtime. 02/16/17   Hamilton, Ryan Ina, MD  hydroxyurea (HYDREA) 500 MG capsule take 2 capsules by mouth once daily (MAY TAKE WITH FOOD TO MINIMIZE GI SIDE EFFECTS) 04/27/17   Ryan Lark, MD  SUBOXONE 8-2 MG FILM Place 1 Film under the tongue daily.  08/21/15   [provider]  tiotropium (SPIRIVA HANDIHALER) 18 MCG inhalation capsule Place 1 capsule (18 mcg total) into inhaler and inhale daily. PLACE ON HOLD UNTIL PATIENT REQUESTS TO FILL 09/27/15   Ryan Mire, MD  warfarin (COUMADIN) 2.5 MG tablet Take one-half tablet on Wednesdays and Fridays; all other days--take one (1) tablet by mouth daily. Patient taking differently: Take 1.25 mg by mouth daily on Tuesday, Thursday and Saturday. Take 2.5 mg by mouth daily on all other days. 12/21/16   Ryan Contes, MD   Past Medical History:  Diagnosis Date  . Acute lower GI bleeding 02/15/2017  . Atrial fibrillation /flutter    hx of flutter ablation 2/12  . Cardiomyopathy    EF55% 11/14<<35%   . CHF (congestive heart failure) ([place])   . COPD (chronic obstructive pulmonary disease) ([place])   . GERD (gastroesophageal reflux disease)   . Gout   . Hepatitis    "  think I had that once; a long time ago; from dirty needles I think" (12/14/2012)  . Hypertension   . Leukocytosis, unspecified 06/12/2013  . 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 Obesity   . Pneumonia X 1  . Primary polycythemia ([place]) 06/12/2013  . Renal insufficiency   . Shortness of breath    "all the time lately" (12/14/2012)  . Substance abuse    alcohol; hx cocaine, heroin, crack use  . Syncope    Social History   Social History  . Marital status: Single    Spouse name: N/A  . Number of children: N/A  . Years of education: N/A   Occupational History  . Retired    Social History Main  Topics  . Smoking status: Current Every Day Smoker    Packs/day: 0.10    Years: 50.00    Types: Cigarettes  . Smokeless tobacco: Never Used  . Alcohol use 53.4 oz/week    12 Cans of beer, 77 Shots of liquor per week     Comment: 02/15/2017 "a pint of vodka/day" plus 12 pack of beer/week"  . Drug use: Yes    Types: "Crack" cocaine, Cocaine, Heroin     Comment: 02/15/2017 "last used crack 4-5 years ago; last used heroin a couple years ago; currently denies any drug use-- on suboxone.   . Sexual activity: Not Currently   Other Topics Concern  . Not on file   Social History Narrative   Moved here from [place]. Lives with [name] and grandchildren. He is retired from "general labor."   Family History  Problem Relation Age of Onset  . Diabetes Mother   . Hypertension Mother   . Cirrhosis Father   . Alcohol abuse Father   . Colon cancer Neg Hx   . Rectal cancer Neg Hx   . Stomach cancer Neg Hx     ASSESSMENT Recent Results: The most recent result is correlated with 17.5 mg per week: Lab Results  Component Value Date   INR 5.2 05/17/2017   INR 2.0 04/26/2017   INR 1.3 04/19/2017    Anticoagulation Dosing: INR as of 05/17/2017 and Previous Warfarin Dosing Information    INR Dt INR Goal [name] [name]   05/17/2017 5.2 2.0-3.0 17.5 mg 2.5 mg 2.5 mg 2.5 mg 2.5 mg 2.5 mg 2.5 mg 2.5 mg    Previous description   Take 1 of your 2.5mg  green warfarin tablets by mouth every day.   Anticoagulation Warfarin Dose Instructions as of 05/17/2017      Total [name]   New Dose 13.75 mg 2.5 mg 2.5 mg Hold 1.25 mg 2.5 mg 2.5 mg 2.5 mg     (2.5 mg x 1)  (2.5 mg x 1)  -  (2.5 mg x 0.5)  (2.5 mg x 1)  (2.5 mg x 1)  (2.5 mg x 1)                         Description   Do NOT take a dose of warfarin on Tuesday, October 30. Take 1 of your 2.5mg  green warfarin tablets by mouth every day EXCEPT on Wednesday - take 1/2 tablet by mouth.      INR today: Supratherapeutic  PLAN Weekly dose was decreased by 20% to 13.75 mg per week  Patient Instructions  Patient instructed to take medications as defined in the Anti-coagulation Track  section of this encounter.  Patient instructed to HOLD today's dose.  Do NOT take a dose of warfarin on Tuesday, October 30. Take 1 of your 2.5mg  green warfarin tablets by mouth every day EXCEPT on Wednesday - take 1/2 tablet by mouth. Patient verbalized understanding of these instructions.     Patient advised to contact clinic or seek medical attention if signs/symptoms of bleeding or thromboembolism occur.  Patient verbalized understanding by repeating back information and was advised to contact me if further medication-related questions arise. Patient was also provided an information handout.  Follow-up Return in 7 days (on 05/24/2017) for Follow up INR at 0900.  Mila Merry Gerarda Fraction, PharmD PGY1 Pharmacy Resident  15 minutes spent face-to-face with the patient during the encounter. 50% of time spent on education. 50% of time was spent on point of care INR fingerstick, results interpretation, dose adjustment, and documentation in [facility] and [email or web address]

## 2017-05-17 NOTE — Patient Instructions (Signed)
Patient instructed to take medications as defined in the Anti-coagulation Track section of this encounter.  Patient instructed to HOLD today's dose.  Do NOT take a dose of warfarin on Tuesday, October 30. Take 1 of your 2.5mg  green warfarin tablets by mouth every day EXCEPT on Wednesday - take 1/2 tablet by mouth. Patient verbalized understanding of these instructions.

## 2017-05-24 ENCOUNTER — Encounter (INDEPENDENT_AMBULATORY_CARE_PROVIDER_SITE_OTHER): Payer: Self-pay

## 2017-05-24 ENCOUNTER — Telehealth: Payer: Self-pay | Admitting: Pharmacist

## 2017-05-24 ENCOUNTER — Ambulatory Visit (INDEPENDENT_AMBULATORY_CARE_PROVIDER_SITE_OTHER): Payer: Medicare Other | Admitting: Pharmacist

## 2017-05-24 DIAGNOSIS — Z7901 Long term (current) use of anticoagulants: Secondary | ICD-10-CM

## 2017-05-24 DIAGNOSIS — I4891 Unspecified atrial fibrillation: Secondary | ICD-10-CM

## 2017-05-24 LAB — PROTIME-INR
INR: 3.15
PROTHROMBIN TIME: 32.1 s — AB (ref 11.4–15.2)

## 2017-05-24 MED ORDER — WARFARIN SODIUM 2.5 MG PO TABS: ORAL_TABLET | ORAL | 2 refills | Status: DC

## 2017-05-24 NOTE — Addendum Note (Signed)
Addended by: [name] B on: 05/24/2017 04:59 PM   Modules accepted: Orders

## 2017-05-24 NOTE — Patient Instructions (Signed)
Patient instructed to take medications as defined in the Anti-coagulation Track section of this encounter.  Patient instructed to take today's dose.  Patient instructed to take one (1) tablet of your 2.5mg  green-colored warfarin tablets all days of the week--EXCEPT on Mondays and Thursdays, take only one-half (1/2) tablet on Mondays and Thursdays. Repeat this regimen weekly until seen at next visit on Monday June 14, 2017 at 9:00AM Patient verbalized understanding of these instructions.

## 2017-05-24 NOTE — Telephone Encounter (Signed)
Called patient and instructed to take 1/2 of his 2.5mg  warfarin tablets on Mondays and Thursdays; all other days, take one (1) tablet. Next INR on June 14, 2017 at 9:00AM,

## 2017-05-24 NOTE — Progress Notes (Signed)
Anticoagulation Management [name] is a 71 y.o. male who reports to the clinic for monitoring of warfarin treatment.    Indication: atrial fibrillation   Duration: indefinite Supervising physician: [facility] Visit History: Patient does not report signs/symptoms of bleeding or thromboembolism  Other recent changes: No diet, medications, lifestyle changes endorsed by the patient to me.  Anticoagulation Episode Summary    Current INR goal:   2.0-3.0  TTR:   66.9 % (4.5 y)  Next INR check:   05/24/2017  INR from last check:   3.15! (05/24/2017)  Weekly max warfarin dose:     Target end date:   Indefinite  [facility]R check [facility]   [facility]  Preferred lab:     Send INR reminders to:      Indications   Atrial fibrillation ([place]) [I48.91]       Comments:           No Known Allergies Prior to Admission medications   Medication Sig Start Date End Date Taking? Authorizing Provider  albuterol (PROAIR HFA) 108 (90 Base) MCG/ACT inhaler Inhale 1-2 puffs into the lungs every 6 (six) hours as needed for wheezing or shortness of breath. PLACE ON HOLD UNTIL PATIENT REQUESTS TO FILL Patient taking differently: Inhale 1-2 puffs into the lungs every 6 (six) hours as needed for wheezing or shortness of breath.  09/27/15  Yes Juluis Mire, MD  amiodarone (PACERONE) 200 MG tablet Take 1 tablet (200 mg total) by mouth daily. 03/24/17  Yes Jule Ser, DO  carvedilol (COREG) 12.5 MG tablet Take 1 tablet (12.5 mg total) by mouth every morning. 03/30/17  Yes Jule Ser, DO  febuxostat (ULORIC) 40 MG tablet Take 1 tablet (40 mg total) by mouth daily. 11/26/16  Yes Jule Ser, DO  fluticasone (FLOVENT HFA) 44 MCG/ACT inhaler Inhale 2 puffs into the lungs 2 (two) times daily.   Yes [provider]  folic acid (FOLVITE) 1 MG tablet take 1 tablet by mouth once daily Patient taking differently: take 1 mg by mouth once daily 07/15/16  Yes  Jule Ser, DO  furosemide (LASIX) 40 MG tablet Take 1 tablet (40 mg total) by mouth daily. 01/19/17  Yes Jule Ser, DO  hydrocortisone (ANUSOL-HC) 25 MG suppository Place 1 suppository (25 mg total) rectally at bedtime. 02/16/17  Yes Helberg, Larkin Ina, MD  hydroxyurea (HYDREA) 500 MG capsule take 2 capsules by mouth once daily (MAY TAKE WITH FOOD TO MINIMIZE GI SIDE EFFECTS) 04/27/17  Yes Alvy Bimler, Ni, MD  SUBOXONE 8-2 MG FILM Place 1 Film under the tongue daily.  08/21/15  Yes [provider]  tiotropium (SPIRIVA HANDIHALER) 18 MCG inhalation capsule Place 1 capsule (18 mcg total) into inhaler and inhale daily. PLACE ON HOLD UNTIL PATIENT REQUESTS TO FILL 09/27/15  Yes Juluis Mire, MD  warfarin (COUMADIN) 2.5 MG tablet Take one-half tablet on Wednesdays and Fridays; all other days--take one (1) tablet by mouth daily. Patient taking differently: Take 1.25 mg by mouth daily on Tuesday, Thursday and Saturday. Take 2.5 mg by mouth daily on all other days. 12/21/16  Yes Aldine Contes, MD   Past Medical History:  Diagnosis Date  . Acute lower GI bleeding 02/15/2017  . Atrial fibrillation /flutter    hx of flutter ablation 2/12  . Cardiomyopathy    EF55% 11/14<<35%   . CHF (congestive heart failure) ([place])   . COPD (chronic obstructive pulmonary disease) ([place])   . GERD (gastroesophageal reflux disease)   . Gout   .  Hepatitis    "think I had that once; a long time ago; from dirty needles I think" (12/14/2012)  . Hypertension   . Leukocytosis, unspecified 06/12/2013  . 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 Obesity   . Pneumonia X 1  . Primary polycythemia ([place]) 06/12/2013  . Renal insufficiency   . Shortness of breath    "all the time lately" (12/14/2012)  . Substance abuse    alcohol; hx cocaine, heroin, crack use  . Syncope    Social History   Socioeconomic History  . Marital status: Single    Spouse name: Not on file  . Number of children: Not on file  .  Years of education: Not on file  . Highest education level: Not on file  Social Needs  . Financial resource strain: Not on file  . Food insecurity - worry: Not on file  . Food insecurity - inability: Not on file  . Transportation needs - medical: Not on file  . Transportation needs - non-medical: Not on file  Occupational History  . Occupation: Retired  Tobacco Use  . Smoking status: Current Every Day Smoker    Packs/day: 0.10    Years: 50.00    Pack years: 5.00    Types: Cigarettes  . Smokeless tobacco: Never Used  Substance and Sexual Activity  . Alcohol use: Yes    Alcohol/week: 53.4 oz    Types: 12 Cans of beer, 77 Shots of liquor per week    Comment: 02/15/2017 "a pint of vodka/day" plus 12 pack of beer/week"  . Drug use: Yes    Types: "Crack" cocaine, Cocaine, Heroin    Comment: 02/15/2017 "last used crack 4-5 years ago; last used heroin a couple years ago; currently denies any drug use-- on suboxone.   . Sexual activity: Not Currently  Other Topics Concern  . Not on file  Social History Narrative   Moved here from [place]. Lives with common law wife and grandchildren. He is retired from "general labor."   Family History  Problem Relation Age of Onset  . Diabetes Mother   . Hypertension Mother   . Cirrhosis Father   . Alcohol abuse Father   . Colon cancer Neg Hx   . Rectal cancer Neg Hx   . Stomach cancer Neg Hx     ASSESSMENT Recent Results: The most recent result is correlated with 16.25 mg per week: Lab Results  Component Value Date   INR 3.15 05/24/2017   INR 5.2 05/17/2017   INR 2.0 04/26/2017    Anticoagulation Dosing: Description   Take one (1) tablet of your 2.5mg  green-colored warfarin tablets all days of the week--EXCEPT on Mondays and Thursdays, take only one-half (1/2) tablet on Mondays and Thursdays. Repeat this regimen weekly until seen at next visit on Monday June 14, 2017 at 9:00AM.     INR today: Supratherapeutic  PLAN Weekly dose  was decreased by 8% to 15 mg per week  Patient Instructions  Patient instructed to take medications as defined in the Anti-coagulation Track section of this encounter.  Patient instructed to take today's dose.  Patient instructed to take one (1) tablet of your 2.5mg  green-colored warfarin tablets all days of the week--EXCEPT on Mondays and Thursdays, take only one-half (1/2) tablet on Mondays and Thursdays. Repeat this regimen weekly until seen at next visit on Monday June 14, 2017 at 9:00AM Patient verbalized understanding of these instructions.     Patient advised to contact clinic or seek medical attention if  signs/symptoms of bleeding or thromboembolism occur.  Patient verbalized understanding by repeating back information and was advised to contact me if further medication-related questions arise. Patient was also provided an information handout.  Follow-up Return in 3 weeks (on 06/14/2017) for Follow up INR at 0900h.  Pennie Banter, PharmD, CACP, CPP  15 minutes spent face-to-face with the patient during the encounter. 50% of time spent on education. 50% of time was spent on venipuncture collection of sample and documentation.

## 2017-05-24 NOTE — Telephone Encounter (Signed)
Venipuncture INR results today 3.15. Decrease dose to following: Using your 2.5mg  strength warfarin tablets--take 1/2 tablet on Mondays and Thursdays; all other days, take one (1) of your 2.5mg  strength warfarin tablets. Repeat this regimen weekly. Next INR on Monday June 14, 2017 at 9:00AM.

## 2017-05-26 ENCOUNTER — Ambulatory Visit (INDEPENDENT_AMBULATORY_CARE_PROVIDER_SITE_OTHER): Payer: Medicare Other | Admitting: Nurse Practitioner

## 2017-05-26 ENCOUNTER — Encounter: Payer: Self-pay | Admitting: Nurse Practitioner

## 2017-05-26 VITALS — BP 118/56 | HR 60 | Ht 63.75 in | Wt 222.4 lb

## 2017-05-26 DIAGNOSIS — D696 Thrombocytopenia, unspecified: Secondary | ICD-10-CM

## 2017-05-26 DIAGNOSIS — K649 Unspecified hemorrhoids: Secondary | ICD-10-CM | POA: Diagnosis not present

## 2017-05-26 NOTE — Progress Notes (Signed)
I reviewed Dr. Gladstone Pih note.  Patient is on [facility] for Afib and INR was elevated.  Coumadin decreased.

## 2017-05-26 NOTE — Patient Instructions (Signed)
If you are age 70 or older, your body mass index should be between 23-30. Your Body mass index is 38.47 kg/m. If this is out of the aforementioned range listed, please consider follow up with your Primary Care Provider.  If you are age 89 or younger, your body mass index should be between 19-25. Your Body mass index is 38.47 kg/m. If this is out of the aformentioned range listed, please consider follow up with your Primary Care Provider.   Follow up as needed.  Thank you for choosing me and [facility].   Tye Savoy, NP

## 2017-05-26 NOTE — Progress Notes (Signed)
     Chief Complaint:  Follow up on hemorrhoidal bleeding  HPI: Patient is a 71 yo male known to Dr. Fuller Plan for hx of adenomatous colon polyps. We saw him in the hospital in July for evaluation of painless rectal bleeding on coumadin. Bleeding was low volume, hgb remained stable. Patient's platelets were low, there was concern for underlying liver disease. I saw him for hospital follow up in August, he was still bleeding at the time. I referred him to Dr. Carlean Purl for consideration of hemorrhoidal banding but it wasn't pursued because there was still concern for underlying cirrhosis. He was treated with steroid cream and hemorrhoidal bleeding resolved. Patient is back for follow up as requested. He feels well. No Gi complaints. Bowels are moving normally. No constipation and still no recurrent bleeding. His weight up from 208 pounds in mid Aug to 222 pounds. No swelling in legs.   Past Medical History:  Diagnosis Date  . Acute lower GI bleeding 02/15/2017  . Atrial fibrillation /flutter    hx of flutter ablation 2/12  . Cardiomyopathy    EF55% 11/14<<35%   . CHF (congestive heart failure) ([place])   . COPD (chronic obstructive pulmonary disease) ([place])   . GERD (gastroesophageal reflux disease)   . Gout   . Hepatitis    "think I had that once; a long time ago; from dirty needles I think" (12/14/2012)  . Hypertension   . Leukocytosis, unspecified 06/12/2013  . 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 Obesity   . Pneumonia X 1  . Primary polycythemia ([place]) 06/12/2013  . Renal insufficiency   . Shortness of breath    "all the time lately" (12/14/2012)  . Substance abuse ([place])    alcohol; hx cocaine, heroin, crack use  . Syncope     Patient's surgical history, family medical history, social history, medications and allergies were all reviewed in Epic    Physical Exam: BP (!) 118/56 (BP Location: Left Arm, Patient Position: Sitting, Cuff Size: Normal)   Pulse 60   Ht 5' 3.75"  (1.619 m)   Wt 222 lb 6 oz (100.9 kg)   BMI 38.47 kg/m   GENERAL:  Overweight male in NAD PSYCH: :Pleasant, cooperative, normal affect EENT:  conjunctiva pink, mucous membranes moist, neck supple without masses CARDIAC:  RRR, no murmur heard, no peripheral edema PULM: Normal respiratory effort, lungs CTA bilaterally, no wheezing ABDOMEN:  Soft, protuberant, nontender. No obvious masses, no hepatomegaly,  normal bowel sounds SKIN:  turgor, no lesions seen Musculoskeletal:  Normal muscle tone, normal strength NEURO: Alert and oriented x 3, no focal neurologic deficits    ASSESSMENT and PLAN:  1. Pleasant 71 year old male with painless rectal bleeding on counadin / hemorrhoids requiring hospitalization in July. Since using steroid preparation he hasn't had any further bleeding.  -contact office for recurrent rectal bleeding. -due for colonoscopy year 2021    2. COPD. Ongoing tobacco use.   3. 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   4. Thrombocytopenia, possibly medication related ( ? Hydrea). Normal spleen on liver spleen scan. Count is stable and he is already  followed by Hematology for polycythemia vera.   Tye Savoy , NP 05/26/2017, 9:40 AM

## 2017-05-27 NOTE — Progress Notes (Signed)
Indication: Atrial fibrillation. Duration: Indefinite. INR: Above targetr. Agree with Dr. Romeo Apple assessment and plan.

## 2017-05-29 ENCOUNTER — Encounter: Payer: Self-pay | Admitting: Nurse Practitioner

## 2017-05-31 NOTE — Progress Notes (Signed)
Reviewed and agree with initial management plan.  Alylah Blakney T. Lancelot Alyea, MD FACG 

## 2017-06-14 ENCOUNTER — Ambulatory Visit (INDEPENDENT_AMBULATORY_CARE_PROVIDER_SITE_OTHER): Payer: Medicare Other | Admitting: Pharmacy Technician

## 2017-06-14 DIAGNOSIS — I4891 Unspecified atrial fibrillation: Secondary | ICD-10-CM

## 2017-06-14 DIAGNOSIS — Z7901 Long term (current) use of anticoagulants: Secondary | ICD-10-CM

## 2017-06-14 LAB — POCT INR: INR: 2.6

## 2017-06-14 NOTE — Patient Instructions (Signed)
Patient instructed to take medications as defined in the Anti-coagulation Track section of this encounter.  Patient instructed to take today's dose.  Patient instructed to take one (1) tablet of your 2.5mg  green-colored warfarin tablets all days of the week--EXCEPT on Mondays and Thursdays, take only one-half (1/2) tablet on Mondays and Thursdays. Repeat this regimen weekly until seen at next visit on Monday July 05, 2017 at 8:30AM. Patient verbalized understanding of these instructions.

## 2017-06-14 NOTE — Progress Notes (Signed)
Anticoagulation Management [name] is a 71 y.o. male who reports to the clinic for monitoring of warfarin treatment.    Indication: atrial fibrillation  Duration: indefinite Supervising physician: Lenice Pressman, MD  Anticoagulation Clinic Visit History: Patient does not report signs/symptoms of bleeding or thromboembolism  Other recent changes: no diet, medications, lifestyle changes endorsed by patient to me Anticoagulation Episode Summary    Current INR goal:   2.0-3.0  TTR:   67.0 % (4.6 y)  Next INR check:   07/05/2017  INR from last check:   2.6 (06/14/2017)  Weekly max warfarin dose:     Target end date:   Indefinite  INR check location:   [facility]  Preferred lab:     Send INR reminders to:      Indications   Atrial fibrillation ([place]) [I48.91]       Comments:           No Known Allergies Prior to Admission medications   Medication Sig Start Date End Date Taking? Authorizing Provider  albuterol (PROAIR HFA) 108 (90 Base) MCG/ACT inhaler Inhale 1-2 puffs into the lungs every 6 (six) hours as needed for wheezing or shortness of breath. PLACE ON HOLD UNTIL PATIENT REQUESTS TO FILL 09/27/15  Yes Rabbani, Ricarda Frame, MD  amiodarone (PACERONE) 200 MG tablet Take 1 tablet (200 mg total) by mouth daily. 03/24/17  Yes Jule Ser, DO  carvedilol (COREG) 12.5 MG tablet Take 1 tablet (12.5 mg total) by mouth every morning. 03/30/17  Yes Jule Ser, DO  febuxostat (ULORIC) 40 MG tablet Take 1 tablet (40 mg total) by mouth daily. 11/26/16  Yes Jule Ser, DO  fluticasone (FLOVENT HFA) 44 MCG/ACT inhaler Inhale 2 puffs into the lungs 2 (two) times daily.   Yes [provider]  folic acid (FOLVITE) 1 MG tablet take 1 tablet by mouth once daily Patient taking differently: take 1 mg by mouth once daily 07/15/16  Yes Jule Ser, DO  furosemide (LASIX) 40 MG tablet Take 1 tablet (40 mg total) by mouth daily. 01/19/17  Yes Jule Ser, DO   hydrocortisone (ANUSOL-HC) 25 MG suppository Place 1 suppository (25 mg total) rectally at bedtime. 02/16/17  Yes Helberg, Larkin Ina, MD  hydroxyurea (HYDREA) 500 MG capsule take 2 capsules by mouth once daily (MAY TAKE WITH FOOD TO MINIMIZE GI SIDE EFFECTS) 04/27/17  Yes Alvy Bimler, Ni, MD  SUBOXONE 8-2 MG FILM Place 1 Film under the tongue daily.  08/21/15  Yes [provider]  tiotropium (SPIRIVA HANDIHALER) 18 MCG inhalation capsule Place 1 capsule (18 mcg total) into inhaler and inhale daily. PLACE ON HOLD UNTIL PATIENT REQUESTS TO FILL 09/27/15  Yes Juluis Mire, MD  warfarin (COUMADIN) 2.5 MG tablet Take one-half tablet by mouth on Mondays and Thursdays. All other days, take 1 tablet by mouth. 05/24/17  Yes Pennie Banter, RPH-CPP   Past Medical History:  Diagnosis Date  . Acute lower GI bleeding 02/15/2017  . Atrial fibrillation /flutter    hx of flutter ablation 2/12  . Cardiomyopathy    EF55% 11/14<<35%   . CHF (congestive heart failure) ([place])   . COPD (chronic obstructive pulmonary disease) ([place])   . GERD (gastroesophageal reflux disease)   . Gout   . Hepatitis    "think I had that once; a long time ago; from dirty needles I think" (12/14/2012)  . Hypertension   . Leukocytosis, unspecified 06/12/2013  . 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 Obesity   . Pneumonia X 1  .  Primary polycythemia ([place]) 06/12/2013  . Renal insufficiency   . Shortness of breath    "all the time lately" (12/14/2012)  . Substance abuse ([place])    alcohol; hx cocaine, heroin, crack use  . Syncope    Social History   Socioeconomic History  . Marital status: Single    Spouse name: Not on file  . Number of children: Not on file  . Years of education: Not on file  . Highest education level: Not on file  Social Needs  . Financial resource strain: Not on file  . Food insecurity - worry: Not on file  . Food insecurity - inability: Not on file  . Transportation needs - medical: Not on file   . Transportation needs - non-medical: Not on file  Occupational History  . Occupation: Retired  Tobacco Use  . Smoking status: Current Every Day Smoker    Packs/day: 0.10    Years: 50.00    Pack years: 5.00    Types: Cigarettes  . Smokeless tobacco: Never Used  Substance and Sexual Activity  . Alcohol use: Yes    Alcohol/week: 53.4 oz    Types: 12 Cans of beer, 77 Shots of liquor per week    Comment: 02/15/2017 "a pint of vodka/day" plus 12 pack of beer/week"  . Drug use: Yes    Types: "Crack" cocaine, Cocaine, Heroin    Comment: 02/15/2017 "last used crack 4-5 years ago; last used heroin a couple years ago; currently denies any drug use-- on suboxone.   . Sexual activity: Not Currently  Other Topics Concern  . Not on file  Social History Narrative   Moved here from [place]. Lives with common law wife and grandchildren. He is retired from "general labor."   Family History  Problem Relation Age of Onset  . Diabetes Mother   . Hypertension Mother   . Cirrhosis Father   . Alcohol abuse Father   . Colon cancer Neg Hx   . Rectal cancer Neg Hx   . Stomach cancer Neg Hx     ASSESSMENT Recent Results: The most recent result is correlated with 15 mg per week: Lab Results  Component Value Date   INR 2.6 06/14/2017   INR 3.15 05/24/2017   INR 5.2 05/17/2017    Anticoagulation Dosing: Description   Take one (1) tablet of your 2.5mg  green-colored warfarin tablets all days of the week--EXCEPT on Mondays and Thursdays, take only one-half (1/2) tablet on Mondays and Thursdays. Repeat this regimen weekly until seen at next visit on Monday July 05, 2017 at 8:30AM.     INR today: Therapeutic  PLAN Weekly dose was unchanged.  Patient Instructions  Patient instructed to take medications as defined in the Anti-coagulation Track section of this encounter.  Patient instructed to take today's dose.  Patient instructed to take one (1) tablet of your 2.5mg  green-colored warfarin  tablets all days of the week--EXCEPT on Mondays and Thursdays, take only one-half (1/2) tablet on Mondays and Thursdays. Repeat this regimen weekly until seen at next visit on Monday July 05, 2017 at 8:30AM. Patient verbalized understanding of these instructions.     Patient advised to contact clinic or seek medical attention if signs/symptoms of bleeding or thromboembolism occur.  Patient verbalized understanding by repeating back information and was advised to contact me if further medication-related questions arise. Patient was also provided an information handout.  Follow-up Return in about 3 weeks (around 07/05/2017) for INR follow up at 0830.  Bertis Ruddy, PharmD  Pharmacy Resident Pager #: [phone] 06/14/2017 9:12 AM  15 minutes spent face-to-face with the patient during the encounter. 50% of time spent on education. 50% of time was spent on finger stick point of care INR sample collection, processing, results determination, and documentation in EMR and [email or web address]

## 2017-06-14 NOTE — Progress Notes (Signed)
INTERNAL MEDICINE TEACHING ATTENDING ADDENDUM  I agree with pharmacy recommendations as outlined in their note.   Alexander N Raines, MD  

## 2017-07-02 ENCOUNTER — Other Ambulatory Visit: Payer: Self-pay

## 2017-07-02 ENCOUNTER — Encounter (HOSPITAL_COMMUNITY): Payer: Self-pay

## 2017-07-02 ENCOUNTER — Emergency Department (HOSPITAL_COMMUNITY)
Admission: EM | Admit: 2017-07-02 | Discharge: 2017-07-02 | Disposition: A | Discharge status: Home / Self Care | Payer: Medicare Other | Attending: Emergency Medicine | Admitting: Emergency Medicine

## 2017-07-02 DIAGNOSIS — I110 Hypertensive heart disease with heart failure: Secondary | ICD-10-CM | POA: Insufficient documentation

## 2017-07-02 DIAGNOSIS — Z79899 Other long term (current) drug therapy: Secondary | ICD-10-CM | POA: Insufficient documentation

## 2017-07-02 DIAGNOSIS — I5030 Unspecified diastolic (congestive) heart failure: Secondary | ICD-10-CM | POA: Insufficient documentation

## 2017-07-02 DIAGNOSIS — Z7901 Long term (current) use of anticoagulants: Secondary | ICD-10-CM | POA: Diagnosis not present

## 2017-07-02 DIAGNOSIS — F17210 Nicotine dependence, cigarettes, uncomplicated: Secondary | ICD-10-CM | POA: Insufficient documentation

## 2017-07-02 DIAGNOSIS — J449 Chronic obstructive pulmonary disease, unspecified: Secondary | ICD-10-CM | POA: Insufficient documentation

## 2017-07-02 DIAGNOSIS — M79671 Pain in right foot: Secondary | ICD-10-CM | POA: Diagnosis not present

## 2017-07-02 DIAGNOSIS — M79672 Pain in left foot: Secondary | ICD-10-CM | POA: Diagnosis not present

## 2017-07-02 MED ORDER — COLCHICINE 0.6 MG PO TABS
1.2000 mg | ORAL_TABLET | Freq: Once | ORAL | Status: AC
  Administered 2017-07-02: 1.2 mg via ORAL
  Filled 2017-07-02: qty 2

## 2017-07-02 MED ORDER — COLCHICINE 0.6 MG PO TABS: 0.6000 mg | ORAL_TABLET | Freq: Every day | ORAL | 0 refills | Status: DC

## 2017-07-02 NOTE — ED Triage Notes (Signed)
Pt c.o bilateral foot pain started 1 week ago, denies injury or swelling. Pt ambulatory. Hx of gout.

## 2017-07-02 NOTE — Discharge Instructions (Signed)
Your symptoms seem consistent with gout.  Please take the medications as prescribed.  May also take NSAIDs if able or tylenol at home as well.  Would also recommend soaking your foot in warm [place] salt bath.  If you develop any worsening redness, fevers, vomiting, swelling in the lower extremities, chest pain, shortness of breath return to the ED for evaluation.  Follow-up with your primary care doctor next week.

## 2017-07-02 NOTE — ED Provider Notes (Signed)
[facility] EMERGENCY DEPARTMENT Provider Note   CSN: [id number] Arrival date & time: 07/02/17  1548     History   Chief Complaint Chief Complaint  Patient presents with  . Foot Pain    HPI [name] is a 71 y.o. male.  HPI 71 year old Caucasian male past medical history significant for atrial fibrillation, CHF, hypertension presents to the ED for evaluation of bilateral foot pain specifically over the bilateral greater toe.  The patient states this pain started approximately a week ago and has progressively worsened.  He has not tried any medication at home for his symptoms.  States he has a history of gout and this feels very similar.  States that he was on preventive medication however this ran out one month ago he has not had a refill yet.  Patient reports pain with palpation over the area and any light touch.  States the pain will radiate over this entire foot.  He is ambulatory however it does cause him some pain.  Patient describes the pain as sharp and stabbing.  Patient denies any associated calf tenderness, chest pain, shortness of breath, fevers, chills, vomiting, chest pain, shortness of breath, paresthesias, weakness. Past Medical History:  Diagnosis Date  . Acute lower GI bleeding 02/15/2017  . Atrial fibrillation /flutter    hx of flutter ablation 2/12  . Cardiomyopathy    EF55% 11/14<<35%   . CHF (congestive heart failure) ([place])   . COPD (chronic obstructive pulmonary disease) ([place])   . GERD (gastroesophageal reflux disease)   . Gout   . Hepatitis    "think I had that once; a long time ago; from dirty needles I think" (12/14/2012)  . Hypertension   . Leukocytosis, unspecified 06/12/2013  . 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 Obesity   . Pneumonia X 1  . Primary polycythemia ([place]) 06/12/2013  . Renal insufficiency   . Shortness of breath    "all the time lately" (12/14/2012)  . Substance abuse ([place])    alcohol; hx  cocaine, heroin, crack use  . Syncope     Patient Active Problem List   Diagnosis Date Noted  . GI bleed 02/15/2017  . Pancytopenia, acquired ([place]) 11/26/2016  . Tubular adenoma 11/25/2016  . Gout 11/02/2016  . Preventive measure 05/25/2016  . Hepatitis C antibody test positive   . Hematuria 11/09/2014  . Acute kidney injury ([place]) 11/01/2014  . Leukopenia due to antineoplastic chemotherapy ([place]) 08/24/2014  . Thrombocytopenia due to drugs 07/25/2014  . Encounter for screening colonoscopy 07/02/2014  . Chronic anticoagulation 07/02/2014  . Prolonged QT interval 06/05/2014  . Sinus bradycardia 06/05/2014  . Opioid dependence ([place]) 06/05/2014  . Diastolic dysfunction 99/37/1696  . COPD (chronic obstructive pulmonary disease) ([place]) 05/30/2014  . Health care maintenance 05/24/2014  . Anemia due to antineoplastic chemotherapy 04/24/2014  . Thyroid mass 08/26/2013  . Syncope 08/22/2013  . 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 Polycythemia vera ([place]) 01/03/2013  . Malnutrition of moderate degree ([place]) 12/15/2012  . Tobacco abuse 09/15/2012  . Heart failure with preserved left ventricular function (HFpEF) ([place])   . Alcohol abuse 09/30/2010  . Hypertension 09/30/2010  . Atrial fibrillation [facility])     Past Surgical History:  Procedure Laterality Date  . CARDIAC ELECTROPHYSIOLOGY MAPPING AND ABLATION  08/2010   [name] 09/07/2010 (12/14/2012)  . COLONOSCOPY     15-20 years ago had colon in [place]  . EXCISIONAL HEMORRHOIDECTOMY  1970's  . LOOP RECORDER IMPLANT N/A 08/23/2013  Procedure: LOOP RECORDER IMPLANT;  Surgeon: Deboraha Sprang, MD;  Location: [facility] CATH LAB;  Service: Cardiovascular;  Laterality: N/A;  . MULTIPLE EXTRACTIONS WITH ALVEOLOPLASTY Bilateral 01/24/2016   Procedure: MULTIPLE EXTRACTION WITH ALVEOLOPLASTY BILATERAL;  Surgeon: Diona Browner, DDS;  Location: [facility];  Service: Oral Surgery;  Laterality: Bilateral;  . MULTIPLE TOOTH EXTRACTIONS  01/24/2016   MULTIPLE  EXTRACTION WITH ALVEOLOPLASTY BILATERAL (Bilateral)       Home Medications    Prior to Admission medications   Medication Sig Start Date End Date Taking? Authorizing Provider  albuterol (PROAIR HFA) 108 (90 Base) MCG/ACT inhaler Inhale 1-2 puffs into the lungs every 6 (six) hours as needed for wheezing or shortness of breath. PLACE ON HOLD UNTIL PATIENT REQUESTS TO FILL 09/27/15   Juluis Mire, MD  amiodarone (PACERONE) 200 MG tablet Take 1 tablet (200 mg total) by mouth daily. 03/24/17   Jule Ser, DO  carvedilol (COREG) 12.5 MG tablet Take 1 tablet (12.5 mg total) by mouth every morning. 03/30/17   Jule Ser, DO  colchicine 0.6 MG tablet Take 1 tablet (0.6 mg total) by mouth daily. Take 1 tablet by mouth daily until symptoms resolve. 07/02/17   Doristine Devoid, PA-C  febuxostat (ULORIC) 40 MG tablet Take 1 tablet (40 mg total) by mouth daily. 11/26/16   Jule Ser, DO  fluticasone (FLOVENT HFA) 44 MCG/ACT inhaler Inhale 2 puffs into the lungs 2 (two) times daily.    [provider]  folic acid (FOLVITE) 1 MG tablet take 1 tablet by mouth once daily Patient taking differently: take 1 mg by mouth once daily 07/15/16   Jule Ser, DO  furosemide (LASIX) 40 MG tablet Take 1 tablet (40 mg total) by mouth daily. 01/19/17   Jule Ser, DO  hydrocortisone (ANUSOL-HC) 25 MG suppository Place 1 suppository (25 mg total) rectally at bedtime. 02/16/17   Helberg, Larkin Ina, MD  hydroxyurea (HYDREA) 500 MG capsule take 2 capsules by mouth once daily (MAY TAKE WITH FOOD TO MINIMIZE GI SIDE EFFECTS) 04/27/17   Heath Lark, MD  SUBOXONE 8-2 MG FILM Place 1 Film under the tongue daily.  08/21/15   [provider]  tiotropium (SPIRIVA HANDIHALER) 18 MCG inhalation capsule Place 1 capsule (18 mcg total) into inhaler and inhale daily. PLACE ON HOLD UNTIL PATIENT REQUESTS TO FILL 09/27/15   Juluis Mire, MD  warfarin (COUMADIN) 2.5 MG tablet Take one-half tablet by mouth on  Mondays and Thursdays. All other days, take 1 tablet by mouth. 05/24/17   Pennie Banter, RPH-CPP    Family History Family History  Problem Relation Age of Onset  . Diabetes Mother   . Hypertension Mother   . Cirrhosis Father   . Alcohol abuse Father   . Colon cancer Neg Hx   . Rectal cancer Neg Hx   . Stomach cancer Neg Hx     Social History Social History   Tobacco Use  . Smoking status: Current Every Day Smoker    Packs/day: 0.10    Years: 50.00    Pack years: 5.00    Types: Cigarettes  . Smokeless tobacco: Never Used  Substance Use Topics  . Alcohol use: Yes    Alcohol/week: 53.4 oz    Types: 12 Cans of beer, 77 Shots of liquor per week    Comment: 02/15/2017 "a pint of vodka/day" plus 12 pack of beer/week"  . Drug use: Yes    Types: "Crack" cocaine, Cocaine, Heroin    Comment: 02/15/2017 "last used  crack 4-5 years ago; last used heroin a couple years ago; currently denies any drug use-- on suboxone.      Allergies   Patient has no known allergies.   Review of Systems Review of Systems  Constitutional: Negative for chills and fever.  Gastrointestinal: Negative for vomiting.  Musculoskeletal: Positive for arthralgias, joint swelling and myalgias. Negative for gait problem.  Skin: Negative for color change and wound.  Neurological: Negative for weakness and numbness.     Physical Exam Updated Vital Signs BP 121/81 (BP Location: Right Arm)   Pulse (!) 56   Temp 98.6 F (37 C) (Oral)   Resp 18   Ht 5\' 6"  (1.676 m)   Wt 98 kg (216 lb)   SpO2 98%   BMI 34.86 kg/m   Physical Exam  Constitutional: He appears well-developed and well-nourished. No distress.  HENT:  Head: Normocephalic and atraumatic.  Eyes: Right eye exhibits no discharge. Left eye exhibits no discharge. No scleral icterus.  Neck: Normal range of motion.  Cardiovascular: Intact distal pulses.  Pulmonary/Chest: No respiratory distress.  Musculoskeletal: Normal range of motion.  No  erythema over left foot. Trace edema present bilaterally. Mild warmth over dorsum of foot. Very tender to palpation over right great toe. Normal ROM but limited due to pain.    No calf tenderness or swelling greater in one leg or the other.  Neurological: He is alert.  Skin: Skin is warm and dry. No pallor.  Psychiatric: His behavior is normal. Judgment and thought content normal.  Nursing note and vitals reviewed.    ED Treatments / Results  Labs (all labs ordered are listed, but only abnormal results are displayed) Labs Reviewed - No data to display  EKG  EKG Interpretation None       Radiology No results found.  Procedures Procedures (including critical care time)  Medications Ordered in ED Medications  colchicine tablet 1.2 mg (not administered)     Initial Impression / Assessment and Plan / ED Course  I have reviewed the triage vital signs and the nursing notes.  Pertinent labs & imaging results that were available during my care of the patient were reviewed by me and considered in my medical decision making (see chart for details).     Patient presents to the ED with complaints of bilateral foot pain specifically over the bilateral greater toe.  Patient has history of gout and states this feels very similar.  Ran out of his prophylactic medication 1 month ago.  Reviewed patient's chart in the past colchicine has worked very well for patient.  Patient has no signs of infection including erythema over the dorsum of the foot.  Does have some mild edema bilaterally however patient with history of congestive heart failure.  No signs of DVT.  Patient is on warfarin for atrial fibrillation and takes it regularly.  Patient has full range of motion doubt septic arthritis.  Given the location and physical exam with prior history likely gout flare.  Have given him colchicine.  Encouraged symptomatic treatment and follow-up with his primary care doctor.  Pt is hemodynamically  stable, in NAD, & able to ambulate in the ED. Evaluation does not show pathology that would require ongoing emergent intervention or inpatient treatment. I explained the diagnosis to the patient. Pain has been managed & has no complaints prior to dc. Pt is comfortable with above plan and is stable for discharge at this time. All questions were answered prior to disposition. Strict return  precautions for f/u to the ED were discussed. Encouraged follow up with PCP.   Final Clinical Impressions(s) / ED Diagnoses   Final diagnoses:  Bilateral foot pain    ED Discharge Orders        Ordered    colchicine 0.6 MG tablet  Daily     07/02/17 1819       Aaron Edelman 07/02/17 1826    Virgel Manifold, MD 07/06/17 1145

## 2017-07-05 ENCOUNTER — Ambulatory Visit (INDEPENDENT_AMBULATORY_CARE_PROVIDER_SITE_OTHER): Payer: Medicare Other | Admitting: Pharmacist

## 2017-07-05 DIAGNOSIS — Z7901 Long term (current) use of anticoagulants: Secondary | ICD-10-CM

## 2017-07-05 DIAGNOSIS — I4891 Unspecified atrial fibrillation: Secondary | ICD-10-CM | POA: Diagnosis present

## 2017-07-05 LAB — POCT INR: INR: 3.2

## 2017-07-05 NOTE — Progress Notes (Signed)
Anticoagulation Management [name] is a 71 y.o. male who reports to the clinic for monitoring of warfarin treatment.    Indication: atrial fibrillation   Duration: indefinite Supervising physician: Oval Linsey  Anticoagulation Clinic Visit History: Patient does not report signs/symptoms of bleeding or thromboembolism  Other recent changes: No diet, medications, lifestyle changes endorsed other than as noted in patient findings.  Anticoagulation Episode Summary    Current INR goal:   2.0-3.0  TTR:   67.0 % (4.6 y)  Next INR check:   08/02/2017  INR from last check:   3.20! (07/05/2017)  Weekly max warfarin dose:     Target end date:   Indefinite  INR check location:   [facility]  Preferred lab:     Send INR reminders to:      Indications   Atrial fibrillation ([place]) [I48.91]       Comments:           No Known Allergies Prior to Admission medications   Medication Sig Start Date End Date Taking? Authorizing Provider  albuterol (PROAIR HFA) 108 (90 Base) MCG/ACT inhaler Inhale 1-2 puffs into the lungs every 6 (six) hours as needed for wheezing or shortness of breath. PLACE ON HOLD UNTIL PATIENT REQUESTS TO FILL 09/27/15  Yes Rabbani, Ricarda Frame, MD  amiodarone (PACERONE) 200 MG tablet Take 1 tablet (200 mg total) by mouth daily. 03/24/17  Yes Jule Ser, DO  carvedilol (COREG) 12.5 MG tablet Take 1 tablet (12.5 mg total) by mouth every morning. 03/30/17  Yes Jule Ser, DO  colchicine 0.6 MG tablet Take 1 tablet (0.6 mg total) by mouth daily. Take 1 tablet by mouth daily until symptoms resolve. 07/02/17  Yes Leaphart, Zack Seal, PA-C  febuxostat (ULORIC) 40 MG tablet Take 1 tablet (40 mg total) by mouth daily. 11/26/16  Yes Jule Ser, DO  fluticasone (FLOVENT HFA) 44 MCG/ACT inhaler Inhale 2 puffs into the lungs 2 (two) times daily.   Yes [provider]  folic acid (FOLVITE) 1 MG tablet take 1 tablet by mouth once daily Patient taking  differently: take 1 mg by mouth once daily 07/15/16  Yes Jule Ser, DO  furosemide (LASIX) 40 MG tablet Take 1 tablet (40 mg total) by mouth daily. 01/19/17  Yes Jule Ser, DO  hydrocortisone (ANUSOL-HC) 25 MG suppository Place 1 suppository (25 mg total) rectally at bedtime. 02/16/17  Yes Helberg, Larkin Ina, MD  hydroxyurea (HYDREA) 500 MG capsule take 2 capsules by mouth once daily (MAY TAKE WITH FOOD TO MINIMIZE GI SIDE EFFECTS) 04/27/17  Yes Alvy Bimler, Ni, MD  SUBOXONE 8-2 MG FILM Place 1 Film under the tongue daily.  08/21/15  Yes [provider]  tiotropium (SPIRIVA HANDIHALER) 18 MCG inhalation capsule Place 1 capsule (18 mcg total) into inhaler and inhale daily. PLACE ON HOLD UNTIL PATIENT REQUESTS TO FILL 09/27/15  Yes Juluis Mire, MD  warfarin (COUMADIN) 2.5 MG tablet Take one-half tablet by mouth on Mondays and Thursdays. All other days, take 1 tablet by mouth. 05/24/17  Yes Pennie Banter, RPH-CPP   Past Medical History:  Diagnosis Date  . Acute lower GI bleeding 02/15/2017  . Atrial fibrillation /flutter    hx of flutter ablation 2/12  . Cardiomyopathy    EF55% 11/14<<35%   . CHF (congestive heart failure) ([place])   . COPD (chronic obstructive pulmonary disease) ([place])   . GERD (gastroesophageal reflux disease)   . Gout   . Hepatitis    "think I had that once;  a long time ago; from dirty needles I think" (12/14/2012)  . Hypertension   . Leukocytosis, unspecified 06/12/2013  . 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 Obesity   . Pneumonia X 1  . Primary polycythemia ([place]) 06/12/2013  . Renal insufficiency   . Shortness of breath    "all the time lately" (12/14/2012)  . Substance abuse ([place])    alcohol; hx cocaine, heroin, crack use  . Syncope    Social History   Socioeconomic History  . Marital status: Single    Spouse name: Not on file  . Number of children: Not on file  . Years of education: Not on file  . Highest education level: Not on file   Social Needs  . Financial resource strain: Not on file  . Food insecurity - worry: Not on file  . Food insecurity - inability: Not on file  . Transportation needs - medical: Not on file  . Transportation needs - non-medical: Not on file  Occupational History  . Occupation: Retired  Tobacco Use  . Smoking status: Current Every Day Smoker    Packs/day: 0.10    Years: 50.00    Pack years: 5.00    Types: Cigarettes  . Smokeless tobacco: Never Used  Substance and Sexual Activity  . Alcohol use: Yes    Alcohol/week: 53.4 oz    Types: 12 Cans of beer, 77 Shots of liquor per week    Comment: 02/15/2017 "a pint of vodka/day" plus 12 pack of beer/week"  . Drug use: Yes    Types: "Crack" cocaine, Cocaine, Heroin    Comment: 02/15/2017 "last used crack 4-5 years ago; last used heroin a couple years ago; currently denies any drug use-- on suboxone.   . Sexual activity: Not Currently  Other Topics Concern  . Not on file  Social History Narrative   Moved here from [place]. Lives with common law wife and grandchildren. He is retired from "general labor."   Family History  Problem Relation Age of Onset  . Diabetes Mother   . Hypertension Mother   . Cirrhosis Father   . Alcohol abuse Father   . Colon cancer Neg Hx   . Rectal cancer Neg Hx   . Stomach cancer Neg Hx     ASSESSMENT Recent Results: The most recent result is correlated with 15 mg per week: Lab Results  Component Value Date   INR 3.20 07/05/2017   INR 2.6 06/14/2017   INR 3.15 05/24/2017    Anticoagulation Dosing: Description   Take one (1) tablet of your 2.5mg  green-colored warfarin tablets all days of the week--EXCEPT on Mondays, Wednesdays and Fridays take only one-half (1/2) tablet. Repeat this regimen weekly until seen at next visit on Monday August 02, 2017 at 8:45AM     INR today: Supratherapeutic  PLAN Weekly dose was decreased by 8% to 13.75 mg per week  Patient Instructions  Patient instructed to take  medications as defined in the Anti-coagulation Track section of this encounter.  Patient instructed to take today's dose.  Patient instructed to take one (1) tablet of your 2.5mg  green-colored warfarin tablets all days of the week--EXCEPT on Mondays, Wednesdays and Fridays take only one-half (1/2) tablet. Repeat this regimen weekly until seen at next visit on Monday August 02, 2017 at 8:45AM Patient verbalized understanding of these instructions.     Patient advised to contact clinic or seek medical attention if signs/symptoms of bleeding or thromboembolism occur.  Patient verbalized understanding by repeating back information  and was advised to contact me if further medication-related questions arise. Patient was also provided an information handout.  Follow-up Return in 4 weeks (on 08/02/2017) for Follow up INR at 0845h.  Pennie Banter , PharmD, CACP, CPP 15 minutes spent face-to-face with the patient during the encounter. 50% of time spent on education. 50% of time was spent on fingerstick point of care INR sample collection, processing, results determination, dose adjustment and documentation in Epic/CH[facility] and [email or web address]

## 2017-07-05 NOTE — Progress Notes (Signed)
Indication: Atrial fibrillation. Duration: Indefinite. INR: Above target. Agree with Dr. Gladstone Pih assessment and plan.

## 2017-07-05 NOTE — Patient Instructions (Signed)
Patient instructed to take medications as defined in the Anti-coagulation Track section of this encounter.  Patient instructed to take today's dose.  Patient instructed to take one (1) tablet of your 2.5mg  green-colored warfarin tablets all days of the week--EXCEPT on Mondays, Wednesdays and Fridays take only one-half (1/2) tablet. Repeat this regimen weekly until seen at next visit on Monday August 02, 2017 at 8:45AM Patient verbalized understanding of these instructions.

## 2017-07-23 ENCOUNTER — Other Ambulatory Visit (HOSPITAL_BASED_OUTPATIENT_CLINIC_OR_DEPARTMENT_OTHER): Payer: Medicare Other

## 2017-07-23 ENCOUNTER — Ambulatory Visit (HOSPITAL_BASED_OUTPATIENT_CLINIC_OR_DEPARTMENT_OTHER): Payer: Medicare Other | Admitting: Hematology and Oncology

## 2017-07-23 ENCOUNTER — Telehealth: Payer: Self-pay | Admitting: Hematology and Oncology

## 2017-07-23 ENCOUNTER — Encounter: Payer: Self-pay | Admitting: Hematology and Oncology

## 2017-07-23 DIAGNOSIS — Z720 Tobacco use: Secondary | ICD-10-CM | POA: Diagnosis not present

## 2017-07-23 DIAGNOSIS — F1020 Alcohol dependence, uncomplicated: Secondary | ICD-10-CM

## 2017-07-23 DIAGNOSIS — D701 Agranulocytosis secondary to cancer chemotherapy: Secondary | ICD-10-CM | POA: Diagnosis not present

## 2017-07-23 DIAGNOSIS — D45 Polycythemia vera: Secondary | ICD-10-CM

## 2017-07-23 DIAGNOSIS — T451X5A Adverse effect of antineoplastic and immunosuppressive drugs, initial encounter: Secondary | ICD-10-CM

## 2017-07-23 LAB — CBC WITH DIFFERENTIAL/PLATELET
BASO%: 0.3 % (ref 0.0–2.0)
Basophils Absolute: 0 10*3/uL (ref 0.0–0.1)
EOS%: 2.4 % (ref 0.0–7.0)
Eosinophils Absolute: 0.1 10*3/uL (ref 0.0–0.5)
HCT: 39.3 % (ref 38.4–49.9)
HGB: 13.7 g/dL (ref 13.0–17.1)
LYMPH%: 36.7 % (ref 14.0–49.0)
MCH: 43.6 pg — ABNORMAL HIGH (ref 27.2–33.4)
MCHC: 34.9 g/dL (ref 32.0–36.0)
MCV: 125.2 fL — ABNORMAL HIGH (ref 79.3–98.0)
MONO#: 0.3 10*3/uL (ref 0.1–0.9)
MONO%: 8.7 % (ref 0.0–14.0)
NEUT#: 1.7 10*3/uL (ref 1.5–6.5)
NEUT%: 51.9 % (ref 39.0–75.0)
PLATELETS: 156 10*3/uL (ref 140–400)
RBC: 3.14 10*6/uL — AB (ref 4.20–5.82)
RDW: 13.5 % (ref 11.0–14.6)
WBC: 3.4 10*3/uL — ABNORMAL LOW (ref 4.0–10.3)
lymph#: 1.2 10*3/uL (ref 0.9–3.3)

## 2017-07-23 NOTE — Assessment & Plan Note (Signed)
I spent some time counseling the patient the importance of tobacco cessation. He is currently attempting to quit on his own.  

## 2017-07-23 NOTE — Progress Notes (Signed)
[facility] OFFICE PROGRESS NOTE  Patient Care Team: Jule Ser, DO as PCP - General Heath Lark, MD as Consulting Physician (Hematology and Oncology)  SUMMARY OF ONCOLOGIC HISTORY:  This is a pleasant gentleman who is being referred here because of high hemoglobin level. In November 2014, we removed one unit of blood and order an additional workup to rule out myeloproliferative disorder. Blood test result confirmed low erythropoietin level along with detectable JAK 2 mutation, confirmed myeloproliferative disorder Unfortunately, bone marrow biopsy on 06/26/2013 was nondiagnostic On 07/31/13 the patient was started on 1 hydroxyurea a day On 08/15/2013, increased hydroxyurea to 2 tablets a day and remove one more unit of blood due to the high hemoglobin On 09/05/2013, I increased hydroxyurea to 3 tablets a day and remove one more unit of blood due to high hemoglobin and hematocrit On 01/23/2014, dose of hydroxyurea is reduced to 2 tablets a day due to anemia. On 04/23/2015, dose of hydroxyurea is adjusted to 500 mg on Mondays, Wednesdays and Fridays and to take 1000 mg for the rest of the week Subsequently, he was placed on hydroxyurea 1000 mg daily.  He is placed on warfarin therapy for chronic atrial fibrillation.   INTERVAL HISTORY: Please see below for problem oriented charting. He returns for further follow-up He is compliant taking all his medications as prescribed However, he continues to smoke about 2 cigarettes/day and drinks approximately half a bottle of vodka 3 times a week He denies recent infection The patient denies any recent signs or symptoms of bleeding such as spontaneous epistaxis, hematuria or hematochezia.   REVIEW OF SYSTEMS:   Constitutional: Denies fevers, chills or abnormal weight loss Eyes: Denies blurriness of vision Ears, nose, mouth, throat, and face: Denies mucositis or sore throat Respiratory: Denies cough, dyspnea or  wheezes Cardiovascular: Denies palpitation, chest discomfort or lower extremity swelling Gastrointestinal:  Denies nausea, heartburn or change in bowel habits Skin: Denies abnormal skin rashes Lymphatics: Denies new lymphadenopathy or easy bruising Neurological:Denies numbness, tingling or new weaknesses Behavioral/Psych: Mood is stable, no new changes  All other systems were reviewed with the patient and are negative.  I have reviewed the past medical history, past surgical history, social history and family history with the patient and they are unchanged from previous note.  ALLERGIES:  has No Known Allergies.  MEDICATIONS:  Current Outpatient Medications  Medication Sig Dispense Refill  . albuterol (PROAIR HFA) 108 (90 Base) MCG/ACT inhaler Inhale 1-2 puffs into the lungs every 6 (six) hours as needed for wheezing or shortness of breath. PLACE ON HOLD UNTIL PATIENT REQUESTS TO FILL 18 g 3  . amiodarone (PACERONE) 200 MG tablet Take 1 tablet (200 mg total) by mouth daily. 30 tablet 11  . carvedilol (COREG) 12.5 MG tablet Take 1 tablet (12.5 mg total) by mouth every morning. 30 tablet 5  . colchicine 0.6 MG tablet Take 1 tablet (0.6 mg total) by mouth daily. Take 1 tablet by mouth daily until symptoms resolve. 10 tablet 0  . febuxostat (ULORIC) 40 MG tablet Take 1 tablet (40 mg total) by mouth daily. 30 tablet 1  . fluticasone (FLOVENT HFA) 44 MCG/ACT inhaler Inhale 2 puffs into the lungs 2 (two) times daily.    . folic acid (FOLVITE) 1 MG tablet take 1 tablet by mouth once daily (Patient taking differently: take 1 mg by mouth once daily) 90 tablet 3  . furosemide (LASIX) 40 MG tablet Take 1 tablet (40 mg total) by mouth daily. [place]  tablet 3  . hydrocortisone (ANUSOL-HC) 25 MG suppository Place 1 suppository (25 mg total) rectally at bedtime. 12 suppository 0  . hydroxyurea (HYDREA) 500 MG capsule take 2 capsules by mouth once daily (MAY TAKE WITH FOOD TO MINIMIZE GI SIDE EFFECTS) 60 capsule  3  . SUBOXONE 8-2 MG FILM Place 1 Film under the tongue daily.     [name] tiotropium (SPIRIVA HANDIHALER) 18 MCG inhalation capsule Place 1 capsule (18 mcg total) into inhaler and inhale daily. PLACE ON HOLD UNTIL PATIENT REQUESTS TO FILL 30 capsule 11  . warfarin (COUMADIN) 2.5 MG tablet Take one-half tablet by mouth on Mondays and Thursdays. All other days, take 1 tablet by mouth. 25 tablet 2   No current facility-administered medications for this visit.     PHYSICAL EXAMINATION: ECOG PERFORMANCE STATUS: 1 - Symptomatic but completely ambulatory  Vitals:   07/23/17 0840  BP: (!) 120/56  Pulse: 67  Resp: 18  Temp: 98.7 F (37.1 C)  SpO2: 92%   Filed Weights   07/23/17 0840  Weight: 215 lb 4.8 oz (97.7 kg)    GENERAL:alert, no distress and comfortable.  He is moderately obese SKIN: skin color, texture, turgor are normal, no rashes or significant lesions EYES: normal, Conjunctiva are pink and non-injected, sclera clear OROPHARYNX:no exudate, no erythema and lips, buccal mucosa, and tongue normal  NECK: supple, thyroid normal size, non-tender, without nodularity LYMPH:  no palpable lymphadenopathy in the cervical, axillary or inguinal LUNGS: Diffuse wheezes noted  hEART: regular rate & rhythm and no murmurs and no lower extremity edema ABDOMEN:abdomen soft, non-tender and normal bowel sounds Musculoskeletal:no cyanosis of digits and no clubbing  NEURO: alert & oriented x 3 with fluent speech, no focal motor/sensory deficits  LABORATORY DATA:  I have reviewed the data as listed    Component Value Date/Time   NA 137 02/16/2017 0446   NA 142 11/26/2016 1352   NA 143 06/12/2013 1125   K 3.6 02/16/2017 0446   K 3.7 06/12/2013 1125   CL 101 02/16/2017 0446   CO2 27 02/16/2017 0446   CO2 32 (H) 06/12/2013 1125   GLUCOSE 87 02/16/2017 0446   GLUCOSE 92 06/12/2013 1125   BUN 21 (H) 02/16/2017 0446   BUN 20 11/26/2016 1352   BUN 14.6 06/12/2013 1125   CREATININE 1.38 (H)  02/16/2017 0446   CREATININE 1.05 08/30/2014 1628   CREATININE 0.9 06/12/2013 1125   CALCIUM 7.7 (L) 02/16/2017 0446   CALCIUM 8.9 06/12/2013 1125   PROT 6.8 11/26/2016 1352   PROT 6.9 06/12/2013 1125   ALBUMIN 3.8 11/26/2016 1352   ALBUMIN 3.3 (L) 06/12/2013 1125   AST 27 11/26/2016 1352   AST 21 06/12/2013 1125   ALT 34 11/26/2016 1352   ALT 28 06/12/2013 1125   ALKPHOS 102 11/26/2016 1352   ALKPHOS 112 06/12/2013 1125   BILITOT 0.4 11/26/2016 1352   BILITOT 0.82 06/12/2013 1125   GFRNONAA 51 (L) 02/16/2017 0446   GFRNONAA 73 08/30/2014 1628   GFRAA 59 (L) 02/16/2017 0446   GFRAA 84 08/30/2014 1628    No results found for: SPEP, UPEP  Lab Results  Component Value Date   WBC 3.4 (L) 07/23/2017   NEUTROABS 1.7 07/23/2017   HGB 13.7 07/23/2017   HCT 39.3 07/23/2017   MCV 125.2 (H) 07/23/2017   PLT 156 07/23/2017      Chemistry      Component Value Date/Time   NA 137 02/16/2017 0446   NA 142 11/26/2016  1352   NA 143 06/12/2013 1125   K 3.6 02/16/2017 0446   K 3.7 06/12/2013 1125   CL 101 02/16/2017 0446   CO2 27 02/16/2017 0446   CO2 32 (H) 06/12/2013 1125   BUN 21 (H) 02/16/2017 0446   BUN 20 11/26/2016 1352   BUN 14.6 06/12/2013 1125   CREATININE 1.38 (H) 02/16/2017 0446   CREATININE 1.05 08/30/2014 1628   CREATININE 0.9 06/12/2013 1125      Component Value Date/Time   CALCIUM 7.7 (L) 02/16/2017 0446   CALCIUM 8.9 06/12/2013 1125   ALKPHOS 102 11/26/2016 1352   ALKPHOS 112 06/12/2013 1125   AST 27 11/26/2016 1352   AST 21 06/12/2013 1125   ALT 34 11/26/2016 1352   ALT 28 06/12/2013 1125   BILITOT 0.4 11/26/2016 1352   BILITOT 0.82 06/12/2013 1125      ASSESSMENT & PLAN:  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 He tolerated the hydroxyurea well.  He will continue taking hydroxyurea 1000 mg daily I recommend we continue the same and I will see him back in 4 months with repeat history, physical  examination and blood work   Tobacco abuse I spent some time counseling the patient the importance of tobacco cessation. 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  All questions were answered. The patient knows to call the clinic with any problems, questions or concerns. No barriers to learning was detected. I spent 15 minutes counseling the patient face to face. The total time spent in the appointment was 20 minutes and more than 50% was on counseling and review of test results     Heath Lark, MD 07/23/2017 9:04 AM

## 2017-07-23 NOTE — Assessment & Plan Note (Signed)
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

## 2017-07-23 NOTE — Telephone Encounter (Signed)
Gave patient AVS and calendar of upcoming May appointments.  °

## 2017-07-23 NOTE — Assessment & Plan Note (Signed)
The patient has JAK2 mutation positive myeloproliferative disorder. He tolerated the hydroxyurea well.  He will continue taking hydroxyurea 1000 mg daily I recommend we continue the same and I will see him back in 4 months with repeat history, physical examination and blood work

## 2017-07-23 NOTE — Assessment & Plan Note (Signed)
He has significant alcoholism, drinking half a bottle Vodca 3 times a week I think that would have contributed to recurrent gout and his leukopenia I explained to the patient's the importance of staying abstinent from alcoholism, especially in view that he is on chronic anticoagulation therapy

## 2017-08-02 ENCOUNTER — Ambulatory Visit: Payer: Medicare Other

## 2017-08-03 ENCOUNTER — Other Ambulatory Visit: Payer: Self-pay | Admitting: *Deleted

## 2017-08-03 MED ORDER — FOLIC ACID 1 MG PO TABS: 1.0000 mg | ORAL_TABLET | Freq: Every day | ORAL | 3 refills | Status: DC

## 2017-08-16 ENCOUNTER — Ambulatory Visit (INDEPENDENT_AMBULATORY_CARE_PROVIDER_SITE_OTHER): Payer: Medicare Other | Admitting: Pharmacist

## 2017-08-16 ENCOUNTER — Other Ambulatory Visit: Payer: Self-pay | Admitting: *Deleted

## 2017-08-16 DIAGNOSIS — Z7901 Long term (current) use of anticoagulants: Secondary | ICD-10-CM

## 2017-08-16 DIAGNOSIS — Z5181 Encounter for therapeutic drug level monitoring: Secondary | ICD-10-CM

## 2017-08-16 DIAGNOSIS — I4891 Unspecified atrial fibrillation: Secondary | ICD-10-CM | POA: Diagnosis present

## 2017-08-16 LAB — POCT INR: INR: 2.3

## 2017-08-16 NOTE — Telephone Encounter (Signed)
Pt as here at the coumadin clinic - requesting refill on Folic acid and Colchicine. Called pt - informed refills on Folic acid was sent to the pharmacy on 08/03/17 and to call the pharmacy.

## 2017-08-16 NOTE — Progress Notes (Signed)
INTERNAL MEDICINE TEACHING ATTENDING ADDENDUM - Lucious Groves, DO Duration- indefinate, Indication- a fib, INR-  Lab Results  Component Value Date   INR 2.30 08/16/2017  . Agree with pharmacy recommendations as outlined in their note.

## 2017-08-16 NOTE — Patient Instructions (Signed)
Patient instructed to take medications as defined in the Anti-coagulation Track section of this encounter.  Patient instructed to take today's dose.  Patient instructed to take one (1) tablet of your 2.5mg  green-colored warfarin tablets all days of the week--EXCEPT on Mondays, Wednesdays and Fridays take only one-half (1/2) tablet. Repeat this regimen weekly. Patient verbalized understanding of these instructions.

## 2017-08-16 NOTE — Progress Notes (Signed)
Anticoagulation Management [name] is a 72 y.o. male who reports to the clinic for monitoring of warfarin treatment.    Indication: atrial fibrillation   Duration: indefinite Supervising physician: Joni Reining  Anticoagulation Clinic Visit History: Patient does not report signs/symptoms of bleeding or thromboembolism  Other recent changes: No diet, medications, lifestyle changes endorsed.  Anticoagulation Episode Summary    Current INR goal:   2.0-3.0  TTR:   67.2 % (4.8 y)  Next INR check:   08/02/2017  INR from last check:   2.30 (08/16/2017)  Weekly max warfarin dose:     Target end date:   Indefinite  INR check location:   [facility]  Preferred lab:     Send INR reminders to:      Indications   Atrial fibrillation ([place]) [I48.91]       Comments:           No Known Allergies Prior to Admission medications   Medication Sig Start Date End Date Taking? Authorizing Provider  albuterol (PROAIR HFA) 108 (90 Base) MCG/ACT inhaler Inhale 1-2 puffs into the lungs every 6 (six) hours as needed for wheezing or shortness of breath. PLACE ON HOLD UNTIL PATIENT REQUESTS TO FILL 09/27/15  Yes Rabbani, Ricarda Frame, MD  amiodarone (PACERONE) 200 MG tablet Take 1 tablet (200 mg total) by mouth daily. 03/24/17  Yes Jule Ser, DO  carvedilol (COREG) 12.5 MG tablet Take 1 tablet (12.5 mg total) by mouth every morning. 03/30/17  Yes Jule Ser, DO  colchicine 0.6 MG tablet Take 1 tablet (0.6 mg total) by mouth daily. Take 1 tablet by mouth daily until symptoms resolve. 07/02/17  Yes Leaphart, Zack Seal, PA-C  febuxostat (ULORIC) 40 MG tablet Take 1 tablet (40 mg total) by mouth daily. 11/26/16  Yes Jule Ser, DO  fluticasone (FLOVENT HFA) 44 MCG/ACT inhaler Inhale 2 puffs into the lungs 2 (two) times daily.   Yes [provider]  folic acid (FOLVITE) 1 MG tablet Take 1 tablet (1 mg total) by mouth daily. 08/03/17  Yes Jule Ser, DO  furosemide (LASIX) 40  MG tablet Take 1 tablet (40 mg total) by mouth daily. 01/19/17  Yes Jule Ser, DO  hydrocortisone (ANUSOL-HC) 25 MG suppository Place 1 suppository (25 mg total) rectally at bedtime. 02/16/17  Yes Helberg, Larkin Ina, MD  hydroxyurea (HYDREA) 500 MG capsule take 2 capsules by mouth once daily (MAY TAKE WITH FOOD TO MINIMIZE GI SIDE EFFECTS) 04/27/17  Yes Alvy Bimler, Ni, MD  SUBOXONE 8-2 MG FILM Place 1 Film under the tongue daily.  08/21/15  Yes [provider]  tiotropium (SPIRIVA HANDIHALER) 18 MCG inhalation capsule Place 1 capsule (18 mcg total) into inhaler and inhale daily. PLACE ON HOLD UNTIL PATIENT REQUESTS TO FILL 09/27/15  Yes Juluis Mire, MD  warfarin (COUMADIN) 2.5 MG tablet Take one-half tablet by mouth on Mondays and Thursdays. All other days, take 1 tablet by mouth. 05/24/17  Yes Pennie Banter, RPH-CPP   Past Medical History:  Diagnosis Date  . Acute lower GI bleeding 02/15/2017  . Atrial fibrillation /flutter    hx of flutter ablation 2/12  . Cardiomyopathy    EF55% 11/14<<35%   . CHF (congestive heart failure) ([place])   . COPD (chronic obstructive pulmonary disease) ([place])   . GERD (gastroesophageal reflux disease)   . Gout   . Hepatitis    "think I had that once; a long time ago; from dirty needles I think" (12/14/2012)  . Hypertension   .  Leukocytosis, unspecified 06/12/2013  . 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 Obesity   . Pneumonia X 1  . Primary polycythemia ([place]) 06/12/2013  . Renal insufficiency   . Shortness of breath    "all the time lately" (12/14/2012)  . Substance abuse ([place])    alcohol; hx cocaine, heroin, crack use  . Syncope    Social History   Socioeconomic History  . Marital status: Single    Spouse name: Not on file  . Number of children: Not on file  . Years of education: Not on file  . Highest education level: Not on file  Social Needs  . Financial resource strain: Not on file  . Food insecurity - worry: Not on file  . Food  insecurity - inability: Not on file  . Transportation needs - medical: Not on file  . Transportation needs - non-medical: Not on file  Occupational History  . Occupation: Retired  Tobacco Use  . Smoking status: Current Every Day Smoker    Packs/day: 0.10    Years: 50.00    Pack years: 5.00    Types: Cigarettes  . Smokeless tobacco: Never Used  Substance and Sexual Activity  . Alcohol use: Yes    Alcohol/week: 53.4 oz    Types: 12 Cans of beer, 77 Shots of liquor per week    Comment: 02/15/2017 "a pint of vodka/day" plus 12 pack of beer/week"  . Drug use: Yes    Types: "Crack" cocaine, Cocaine, Heroin    Comment: 02/15/2017 "last used crack 4-5 years ago; last used heroin a couple years ago; currently denies any drug use-- on suboxone.   . Sexual activity: Not Currently  Other Topics Concern  . Not on file  Social History Narrative   Moved here from [place]. Lives with common law wife and grandchildren. He is retired from "general labor."   Family History  Problem Relation Age of Onset  . Diabetes Mother   . Hypertension Mother   . Cirrhosis Father   . Alcohol abuse Father   . Colon cancer Neg Hx   . Rectal cancer Neg Hx   . Stomach cancer Neg Hx     ASSESSMENT Recent Results: The most recent result is correlated with 13.5 mg per week: Lab Results  Component Value Date   INR 2.30 08/16/2017   INR 3.20 07/05/2017   INR 2.6 06/14/2017    Anticoagulation Dosing: Description   Take one (1) tablet of your 2.5mg  green-colored warfarin tablets all days of the week--EXCEPT on Mondays, Wednesdays and Fridays take only one-half (1/2) tablet. Repeat this regimen weekly.     INR today: Therapeutic  PLAN Weekly dose was unchanged .  Patient Instructions  Patient instructed to take medications as defined in the Anti-coagulation Track section of this encounter.  Patient instructed to take today's dose.  Patient instructed to take one (1) tablet of your 2.5mg  green-colored  warfarin tablets all days of the week--EXCEPT on Mondays, Wednesdays and Fridays take only one-half (1/2) tablet. Repeat this regimen weekly. Patient verbalized understanding of these instructions.     Patient advised to contact clinic or seek medical attention if signs/symptoms of bleeding or thromboembolism occur.  Patient verbalized understanding by repeating back information and was advised to contact me if further medication-related questions arise. Patient was also provided an information handout.  Follow-up Return in 3 weeks (on 09/06/2017) for Follow up INR at 0900h.  Pennie Banter , PharmD, CACP, CPP 15 minutes spent face-to-face with the  patient during the encounter. 50% of time spent on education. 50% of time was spent on fingerstick point of care INR sample collection, processing, results determination, documentation in [email or web address].

## 2017-08-17 MED ORDER — COLCHICINE 0.6 MG PO TABS: 0.6000 mg | ORAL_TABLET | Freq: Every day | ORAL | 0 refills | Status: DC

## 2017-08-18 NOTE — Telephone Encounter (Signed)
Colchicine rx called to [facility] pharmacy.

## 2017-09-03 ENCOUNTER — Other Ambulatory Visit: Payer: Self-pay | Admitting: *Deleted

## 2017-09-03 DIAGNOSIS — I4891 Unspecified atrial fibrillation: Secondary | ICD-10-CM

## 2017-09-03 DIAGNOSIS — D45 Polycythemia vera: Secondary | ICD-10-CM

## 2017-09-03 MED ORDER — HYDROXYUREA 500 MG PO CAPS: ORAL_CAPSULE | ORAL | 3 refills | Status: DC

## 2017-09-03 MED ORDER — WARFARIN SODIUM 2.5 MG PO TABS: ORAL_TABLET | ORAL | 2 refills | Status: DC

## 2017-09-03 NOTE — Telephone Encounter (Addendum)
Pt was here at the clinic requesting refills on Warfarin and Hydroxyurea (out of meds). Has a Coumadin appt on Monday and scheduled an appt w/Dr Juleen China 3/21 (first available) - given to pt.

## 2017-09-06 ENCOUNTER — Ambulatory Visit: Payer: Medicare Other

## 2017-09-20 ENCOUNTER — Ambulatory Visit (INDEPENDENT_AMBULATORY_CARE_PROVIDER_SITE_OTHER): Payer: Medicare Other

## 2017-09-20 DIAGNOSIS — Z7901 Long term (current) use of anticoagulants: Secondary | ICD-10-CM | POA: Diagnosis not present

## 2017-09-20 DIAGNOSIS — Z5181 Encounter for therapeutic drug level monitoring: Secondary | ICD-10-CM

## 2017-09-20 DIAGNOSIS — I4891 Unspecified atrial fibrillation: Secondary | ICD-10-CM

## 2017-09-20 LAB — POCT INR: INR: 3.2

## 2017-09-20 NOTE — Progress Notes (Signed)
Anticoagulation Management Morgen Cezar Misiaszek is a 72 y.o. male who reports to the clinic for monitoring of warfarin treatment.    Indication: atrial fibrillation Duration: indefinite Supervising physician: [place] [facility] Visit History: Patient does not report signs/symptoms of bleeding or thromboembolism. He does however report cutting down a tree and it fell on him, but denies any pain or significant bleeding from the incident. On inspection there was a slight hematoma, he denied visual changes, and his pupils were equal, round, reactive to light and accommodating.  Other recent changes: no changes in diet, medications, lifestyle Anticoagulation Episode Summary    Current INR goal:   2.0-3.0  TTR:   67.4 % (4.9 y)  Next INR check:   10/11/2017  INR from last check:   3.2! (09/20/2017)  Weekly max warfarin dose:     Target end date:   Indefinite  INR check location[facility]   [facility]  Preferred lab:     Send INR reminders to:      Indications   Atrial fibrillation ([place]) [I48.91]       Comments:           No Known Allergies Prior to Admission medications   Medication Sig Start Date End Date Taking? Authorizing Provider  albuterol (PROAIR HFA) 108 (90 Base) MCG/ACT inhaler Inhale 1-2 puffs into the lungs every 6 (six) hours as needed for wheezing or shortness of breath. PLACE ON HOLD UNTIL PATIENT REQUESTS TO FILL 09/27/15   Juluis Mire, MD  amiodarone (PACERONE) 200 MG tablet Take 1 tablet (200 mg total) by mouth daily. 03/24/17   Jule Ser, DO  carvedilol (COREG) 12.5 MG tablet Take 1 tablet (12.5 mg total) by mouth every morning. 03/30/17   Jule Ser, DO  colchicine 0.6 MG tablet Take 1 tablet (0.6 mg total) by mouth daily. Take 1 tablet by mouth daily until symptoms resolve. 08/17/17   Jule Ser, DO  febuxostat (ULORIC) 40 MG tablet Take 1 tablet (40 mg total) by mouth daily. 11/26/16   Jule Ser, DO  fluticasone (FLOVENT  HFA) 44 MCG/ACT inhaler Inhale 2 puffs into the lungs 2 (two) times daily.    [provider]  folic acid (FOLVITE) 1 MG tablet Take 1 tablet (1 mg total) by mouth daily. 08/03/17   Jule Ser, DO  furosemide (LASIX) 40 MG tablet Take 1 tablet (40 mg total) by mouth daily. 01/19/17   Jule Ser, DO  hydrocortisone (ANUSOL-HC) 25 MG suppository Place 1 suppository (25 mg total) rectally at bedtime. 02/16/17   Helberg, Larkin Ina, MD  hydroxyurea (HYDREA) 500 MG capsule take 2 capsules by mouth once daily (MAY TAKE WITH FOOD TO MINIMIZE GI SIDE EFFECTS) 09/03/17   Sid Falcon, MD  SUBOXONE 8-2 MG FILM Place 1 Film under the tongue daily.  08/21/15   [provider]  tiotropium (SPIRIVA HANDIHALER) 18 MCG inhalation capsule Place 1 capsule (18 mcg total) into inhaler and inhale daily. PLACE ON HOLD UNTIL PATIENT REQUESTS TO FILL 09/27/15   Juluis Mire, MD  warfarin (COUMADIN) 2.5 MG tablet Take one-half tablet by mouth on Mondays and Thursdays. All other days, take 1 tablet by mouth. 09/03/17   Sid Falcon, MD   Past Medical History:  Diagnosis Date  . Acute lower GI bleeding 02/15/2017  . Atrial fibrillation /flutter    hx of flutter ablation 2/12  . Cardiomyopathy    EF55% 11/14<<35%   . CHF (congestive heart failure) ([place])   . COPD (chronic  obstructive pulmonary disease) ([place])   . GERD (gastroesophageal reflux disease)   . Gout   . Hepatitis    "think I had that once; a long time ago; from dirty needles I think" (12/14/2012)  . Hypertension   . Leukocytosis, unspecified 06/12/2013  . 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 Obesity   . Pneumonia X 1  . Primary polycythemia ([place]) 06/12/2013  . Renal insufficiency   . Shortness of breath    "all the time lately" (12/14/2012)  . Substance abuse ([place])    alcohol; hx cocaine, heroin, crack use  . Syncope    Social History   Socioeconomic History  . Marital status: Single    Spouse name: Not on file  .  Number of children: Not on file  . Years of education: Not on file  . Highest education level: Not on file  Social Needs  . Financial resource strain: Not on file  . Food insecurity - worry: Not on file  . Food insecurity - inability: Not on file  . Transportation needs - medical: Not on file  . Transportation needs - non-medical: Not on file  Occupational History  . Occupation: Retired  Tobacco Use  . Smoking status: Current Every Day Smoker    Packs/day: 0.10    Years: 50.00    Pack years: 5.00    Types: Cigarettes  . Smokeless tobacco: Never Used  Substance and Sexual Activity  . Alcohol use: Yes    Alcohol/week: 53.4 oz    Types: 12 Cans of beer, 77 Shots of liquor per week    Comment: 02/15/2017 "a pint of vodka/day" plus 12 pack of beer/week"  . Drug use: Yes    Types: "Crack" cocaine, Cocaine, Heroin    Comment: 02/15/2017 "last used crack 4-5 years ago; last used heroin a couple years ago; currently denies any drug use-- on suboxone.   . Sexual activity: Not Currently  Other Topics Concern  . Not on file  Social History Narrative   Moved here from [place]. Lives with common law wife and grandchildren. He is retired from "general labor."   Family History  Problem Relation Age of Onset  . Diabetes Mother   . Hypertension Mother   . Cirrhosis Father   . Alcohol abuse Father   . Colon cancer Neg Hx   . Rectal cancer Neg Hx   . Stomach cancer Neg Hx     ASSESSMENT Recent Results: The most recent result is correlated with 13.75 mg per week: Lab Results  Component Value Date   INR 3.2 09/20/2017   INR 2.30 08/16/2017   INR 3.20 07/05/2017    Anticoagulation Dosing: Description   Take one (1) tablet of your 2.5mg  green-colored warfarin tablets all days of the week--EXCEPT on Mondays, Wednesdays and Fridays take only one-half (1/2) tablet. Repeat this regimen weekly.     INR today: Supratherapeutic  PLAN Weekly dose was unchanged. With the slight  supratherapeutic level, will monitor the trend to prevent a subtherapeutic level with our adjustments in the future. Should he remain elevated at the next visit, we can lower his TWD.  Patient Instructions  Patient instructed to take medications as defined in the Anti-coagulation Track section of this encounter.  Patient instructed to take today's dose.  Patient verbalized understanding of these instructions.  Patient was instructed to take one (1) tablet of your 2.5mg  green-colored warfarin tablets all days of the week--EXCEPT on Mondays, Wednesdays and Fridays take only one-half (1/2) tablet. Repeat  this regimen weekly.  Patient advised to contact clinic or seek medical attention if signs/symptoms of bleeding or thromboembolism occur.  Patient verbalized understanding by repeating back information and was advised to contact me if further medication-related questions arise. Patient was also provided an information handout.  Follow-up Return in about 3 weeks (around 10/11/2017) for INR f/u @0845 .  Patterson Hammersmith PharmD PGY1 Pharmacy Practice Resident 09/20/2017 10:12 AM   15 minutes spent face-to-face with the patient during the encounter. 50% of time spent on education. 50% of time was spent on collection of blood sample for INR, interpretation of results, explanation to patient and documentation into Epic and [email or web address]

## 2017-09-20 NOTE — Patient Instructions (Signed)
Patient instructed to take medications as defined in the Anti-coagulation Track section of this encounter.  Patient instructed to take today's dose.  Patient verbalized understanding of these instructions.  Patient was instructed to take one (1) tablet of your 2.5mg  green-colored warfarin tablets all days of the week--EXCEPT on Mondays, Wednesdays and Fridays take only one-half (1/2) tablet. Repeat this regimen weekly.

## 2017-09-20 NOTE — Progress Notes (Signed)
INTERNAL MEDICINE TEACHING ATTENDING ADDENDUM - Aldine Contes M.D  Duration- indefinite, Indication- Afib, INR- supratherapeutic. Agree with pharmacy recommendations as outlined in their note.

## 2017-09-30 ENCOUNTER — Encounter (HOSPITAL_COMMUNITY): Payer: Self-pay | Admitting: Emergency Medicine

## 2017-09-30 ENCOUNTER — Emergency Department (HOSPITAL_COMMUNITY)
Admission: EM | Admit: 2017-09-30 | Discharge: 2017-09-30 | Disposition: A | Discharge status: Home / Self Care | Payer: Medicare Other | Attending: Emergency Medicine | Admitting: Emergency Medicine

## 2017-09-30 ENCOUNTER — Emergency Department (HOSPITAL_COMMUNITY): Payer: Medicare Other

## 2017-09-30 DIAGNOSIS — X398XXA Other exposure to forces of nature, initial encounter: Secondary | ICD-10-CM | POA: Diagnosis not present

## 2017-09-30 DIAGNOSIS — S20212A Contusion of left front wall of thorax, initial encounter: Secondary | ICD-10-CM | POA: Diagnosis not present

## 2017-09-30 DIAGNOSIS — I10 Essential (primary) hypertension: Secondary | ICD-10-CM | POA: Insufficient documentation

## 2017-09-30 DIAGNOSIS — Z7901 Long term (current) use of anticoagulants: Secondary | ICD-10-CM | POA: Insufficient documentation

## 2017-09-30 DIAGNOSIS — S299XXA Unspecified injury of thorax, initial encounter: Secondary | ICD-10-CM | POA: Insufficient documentation

## 2017-09-30 DIAGNOSIS — W208XXA Other cause of strike by thrown, projected or falling object, initial encounter: Secondary | ICD-10-CM | POA: Diagnosis not present

## 2017-09-30 DIAGNOSIS — J449 Chronic obstructive pulmonary disease, unspecified: Secondary | ICD-10-CM | POA: Insufficient documentation

## 2017-09-30 DIAGNOSIS — Y9389 Activity, other specified: Secondary | ICD-10-CM | POA: Diagnosis not present

## 2017-09-30 DIAGNOSIS — Z79899 Other long term (current) drug therapy: Secondary | ICD-10-CM | POA: Diagnosis not present

## 2017-09-30 DIAGNOSIS — S6992XA Unspecified injury of left wrist, hand and finger(s), initial encounter: Secondary | ICD-10-CM | POA: Diagnosis present

## 2017-09-30 DIAGNOSIS — F17210 Nicotine dependence, cigarettes, uncomplicated: Secondary | ICD-10-CM | POA: Insufficient documentation

## 2017-09-30 DIAGNOSIS — Y929 Unspecified place or not applicable: Secondary | ICD-10-CM | POA: Diagnosis not present

## 2017-09-30 DIAGNOSIS — M25532 Pain in left wrist: Secondary | ICD-10-CM | POA: Insufficient documentation

## 2017-09-30 DIAGNOSIS — Y999 Unspecified external cause status: Secondary | ICD-10-CM | POA: Insufficient documentation

## 2017-09-30 MED ORDER — IBUPROFEN 400 MG PO TABS
400.0000 mg | ORAL_TABLET | Freq: Once | ORAL | Status: AC | PRN
  Administered 2017-09-30: 400 mg via ORAL
  Filled 2017-09-30: qty 1

## 2017-09-30 NOTE — Discharge Instructions (Addendum)
Ice and elevate your wrist. Splint for support. Please follow up with your doctor if not improving after a week. Return if worsening. Make sure to take your inhaler for wheezing.

## 2017-09-30 NOTE — ED Notes (Signed)
Ortho tech paged  

## 2017-09-30 NOTE — ED Triage Notes (Signed)
Pt states a tree fell on his left wrist 3 weeks ago but it started hurting 1 week ago. Pulses 3+. Pt states he cannot move his left wrist. Pt also c.o. 1 week of left rib pain.

## 2017-09-30 NOTE — Progress Notes (Signed)
Orthopedic Tech Progress Note Patient Details:  [name] [facility] 22-Oct-1945 [id number]  Ortho Devices Type of Ortho Device: Velcro wrist splint Ortho Device/Splint Location: lue Ortho Device/Splint Interventions: Ordered, Application, Adjustment   Post Interventions Patient Tolerated: Well Instructions Provided: Care of device, Adjustment of device   [name] 09/30/2017, 11:27 PM

## 2017-09-30 NOTE — ED Provider Notes (Signed)
[facility] EMERGENCY DEPARTMENT Provider Note   CSN: [id number] Arrival date & time: 09/30/17  1839     History   Chief Complaint Chief Complaint  Patient presents with  . Wrist Pain  . rib pain    HPI [name] is a 72 y.o. male.  HPI [name] is a 72 y.o. male with hx of afib, CHF, COPD, gout, polycythemia, polysubstance abuse, presents to ED with complaint of left wrist, left rib pain.  Patient states he had a tree fall on him 1 week ago.  He states it hit him in the back of the head, left ribs, and left wrist.  Patient states that initially he did not have severe pain, but pain gradually became worse.  He is currently having trouble with movement of the wrist.  Patient denies any other injuries.  He denies any difficulty breathing.  He denies any fever or chills.  No other complaints.  Past Medical History:  Diagnosis Date  . Acute lower GI bleeding 02/15/2017  . Atrial fibrillation /flutter    hx of flutter ablation 2/12  . Cardiomyopathy    EF55% 11/14<<35%   . CHF (congestive heart failure) ([place])   . COPD (chronic obstructive pulmonary disease) ([place])   . GERD (gastroesophageal reflux disease)   . Gout   . Hepatitis    "think I had that once; a long time ago; from dirty needles I think" (12/14/2012)  . Hypertension   . Leukocytosis, unspecified 06/12/2013  . 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 Obesity   . Pneumonia X 1  . Primary polycythemia ([place]) 06/12/2013  . Renal insufficiency   . Shortness of breath    "all the time lately" (12/14/2012)  . Substance abuse ([place])    alcohol; hx cocaine, heroin, crack use  . Syncope     Patient Active Problem List   Diagnosis Date Noted  . GI bleed 02/15/2017  . Pancytopenia, acquired ([place]) 11/26/2016  . Tubular adenoma 11/25/2016  . Gout 11/02/2016  . Preventive measure 05/25/2016  . Hepatitis C antibody test positive   . Hematuria 11/09/2014  . Acute  kidney injury ([place]) 11/01/2014  . Leukopenia due to antineoplastic chemotherapy ([place]) 08/24/2014  . Thrombocytopenia due to drugs 07/25/2014  . Encounter for screening colonoscopy 07/02/2014  . Chronic anticoagulation 07/02/2014  . Prolonged QT interval 06/05/2014  . Sinus bradycardia 06/05/2014  . Opioid dependence ([place]) 06/05/2014  . Diastolic dysfunction 69/67/8938  . COPD (chronic obstructive pulmonary disease) ([place]) 05/30/2014  . Health care maintenance 05/24/2014  . Anemia due to antineoplastic chemotherapy 04/24/2014  . Thyroid mass 08/26/2013  . Syncope 08/22/2013  . 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 Polycythemia vera ([place]) 01/03/2013  . Malnutrition of moderate degree ([place]) 12/15/2012  . Tobacco abuse 09/15/2012  . Heart failure with preserved left ventricular function (HFpEF) ([place])   . Alcoholism ([place]) 09/30/2010  . Hypertension 09/30/2010  . Atrial fibrillation [facility])     Past Surgical History:  Procedure Laterality Date  . CARDIAC ELECTROPHYSIOLOGY MAPPING AND ABLATION  08/2010   [name] 09/07/2010 (12/14/2012)  . COLONOSCOPY     15-20 years ago had colon in [place]  . EXCISIONAL HEMORRHOIDECTOMY  1970's  . LOOP RECORDER IMPLANT N/A 08/23/2013   Procedure: LOOP RECORDER IMPLANT;  Surgeon: Deboraha Sprang, MD;  Location[facility] C[facility] LAB;  Service: Cardiovascular;  Laterality: N/A;  . MULTIPLE EXTRACTIONS WITH ALVEOLOPLASTY Bilateral 01/24/2016   Procedure: MULTIPLE EXTRACTION WITH ALVEOLOPLASTY BILATERAL;  Surgeon: Nicki Reaper  Hoyt Koch, DDS;  Location: [facility];  Service: Oral Surgery;  Laterality: Bilateral;  . MULTIPLE TOOTH EXTRACTIONS  01/24/2016   MULTIPLE EXTRACTION WITH ALVEOLOPLASTY BILATERAL (Bilateral)       Home Medications    Prior to Admission medications   Medication Sig Start Date End Date Taking? Authorizing Provider  albuterol (PROAIR HFA) 108 (90 Base) MCG/ACT inhaler Inhale 1-2 puffs into the lungs every 6 (six) hours as needed for wheezing or shortness of  breath. PLACE ON HOLD UNTIL PATIENT REQUESTS TO FILL 09/27/15   Juluis Mire, MD  amiodarone (PACERONE) 200 MG tablet Take 1 tablet (200 mg total) by mouth daily. 03/24/17   Jule Ser, DO  carvedilol (COREG) 12.5 MG tablet Take 1 tablet (12.5 mg total) by mouth every morning. 03/30/17   Jule Ser, DO  colchicine 0.6 MG tablet Take 1 tablet (0.6 mg total) by mouth daily. Take 1 tablet by mouth daily until symptoms resolve. 08/17/17   Jule Ser, DO  febuxostat (ULORIC) 40 MG tablet Take 1 tablet (40 mg total) by mouth daily. 11/26/16   Jule Ser, DO  fluticasone (FLOVENT HFA) 44 MCG/ACT inhaler Inhale 2 puffs into the lungs 2 (two) times daily.    [provider]  folic acid (FOLVITE) 1 MG tablet Take 1 tablet (1 mg total) by mouth daily. 08/03/17   Jule Ser, DO  furosemide (LASIX) 40 MG tablet Take 1 tablet (40 mg total) by mouth daily. 01/19/17   Jule Ser, DO  hydrocortisone (ANUSOL-HC) 25 MG suppository Place 1 suppository (25 mg total) rectally at bedtime. 02/16/17   Helberg, Larkin Ina, MD  hydroxyurea (HYDREA) 500 MG capsule take 2 capsules by mouth once daily (MAY TAKE WITH FOOD TO MINIMIZE GI SIDE EFFECTS) 09/03/17   Sid Falcon, MD  SUBOXONE 8-2 MG FILM Place 1 Film under the tongue daily.  08/21/15   [provider]  tiotropium (SPIRIVA HANDIHALER) 18 MCG inhalation capsule Place 1 capsule (18 mcg total) into inhaler and inhale daily. PLACE ON HOLD UNTIL PATIENT REQUESTS TO FILL 09/27/15   Juluis Mire, MD  warfarin (COUMADIN) 2.5 MG tablet Take one-half tablet by mouth on Mondays and Thursdays. All other days, take 1 tablet by mouth. 09/03/17   Sid Falcon, MD    Family History Family History  Problem Relation Age of Onset  . Diabetes Mother   . Hypertension Mother   . Cirrhosis Father   . Alcohol abuse Father   . Colon cancer Neg Hx   . Rectal cancer Neg Hx   . Stomach cancer Neg Hx     Social History Social History   Tobacco  Use  . Smoking status: Current Every Day Smoker    Packs/day: 0.10    Years: 50.00    Pack years: 5.00    Types: Cigarettes  . Smokeless tobacco: Never Used  Substance Use Topics  . Alcohol use: Yes    Alcohol/week: 53.4 oz    Types: 12 Cans of beer, 77 Shots of liquor per week    Comment: 02/15/2017 "a pint of vodka/day" plus 12 pack of beer/week"  . Drug use: Yes    Types: "Crack" cocaine, Cocaine, Heroin    Comment: 02/15/2017 "last used crack 4-5 years ago; last used heroin a couple years ago; currently denies any drug use-- on suboxone.      Allergies   Patient has no known allergies.   Review of Systems Review of Systems  Constitutional: Negative for chills and fever.  Respiratory:  Positive for chest tightness.   Cardiovascular: Positive for chest pain.  Musculoskeletal: Positive for arthralgias, joint swelling and myalgias.  Neurological: Negative for weakness and numbness.  All other systems reviewed and are negative.    Physical Exam Updated Vital Signs BP 120/74   Pulse 61   Temp 98.5 F (36.9 C) (Oral)   Resp 18   Ht 5\' 6"  (1.676 m)   SpO2 100%   BMI 34.75 kg/m   Physical Exam  Constitutional: He appears well-developed and well-nourished. No distress.  HENT:  Head: Normocephalic and atraumatic.  Eyes: Conjunctivae are normal.  Neck: Neck supple.  Cardiovascular: Normal rate, regular rhythm and normal heart sounds.  Pulmonary/Chest: Effort normal. No respiratory distress. He has no wheezes. He has no rales. He exhibits tenderness.  Left lateral lower rib tenderness, no bruising, swelling, erythema, rash  Musculoskeletal: He exhibits no edema.  Obvious swelling to the left wrist.  Tender to palpation over dorsal wrist.  Pain with range of motion.  Normal hand and elbow.  Cap refill less than 2 seconds distally.  Distal radial pulse intact.  Neurological: He is alert.  Skin: Skin is warm and dry.  Nursing note and vitals reviewed.    ED Treatments /  Results  Labs (all labs ordered are listed, but only abnormal results are displayed) Labs Reviewed - No data to display  EKG  EKG Interpretation None       Radiology Dg Ribs Unilateral W/chest Left  Result Date: 09/30/2017 CLINICAL DATA:  Pain for 1 week EXAM: LEFT RIBS AND CHEST - 3+ VIEW COMPARISON:  Chest radiograph September 27, 2015 FINDINGS: Frontal chest as well as oblique and cone-down rib images obtained. There is mild atelectasis in the left mid lung. There is no edema or consolidation. Heart size and pulmonary vascularity are normal. There is aortic atherosclerosis. No adenopathy. There is a loop recorder on the left. No evident pleural effusion or pneumothorax. No rib fracture apparent. IMPRESSION: No rib fracture evident. No edema or consolidation. Mild left midlung atelectasis. Stable cardiac silhouette. There is aortic atherosclerosis. Aortic Atherosclerosis (ICD10-I70.0). Electronically Signed   By: Lowella Grip III M.D.   On: 09/30/2017 19:58   Dg Wrist Complete Left  Result Date: 09/30/2017 CLINICAL DATA:  Pain after tree fell on wrist EXAM: LEFT WRIST - COMPLETE 3+ VIEW COMPARISON:  None. FINDINGS: Frontal, oblique, lateral, and ulnar deviation scaphoid images were obtained. No fracture or dislocation. Joint spaces appear unremarkable. No erosive change. There is radial artery calcification. IMPRESSION: No fracture or dislocation. No evident arthropathy. There is radial artery atherosclerosis. Electronically Signed   By: Lowella Grip III M.D.   On: 09/30/2017 19:55   Dg Hand Complete Left  Result Date: 09/30/2017 CLINICAL DATA:  Tree fell on hand EXAM: LEFT HAND - COMPLETE 3+ VIEW COMPARISON:  None. FINDINGS: Frontal, oblique, and lateral views obtained. No fracture or dislocation. Small erosions are noted in the second and fifth DIP joints. No appreciable joint space narrowing. No erosive change. There is radial artery calcification. IMPRESSION: Mild erosive  arthropathy in the second and fifth DIP joints. No fracture or dislocation. Radial artery atherosclerosis noted. Electronically Signed   By: Lowella Grip III M.D.   On: 09/30/2017 19:56    Procedures Procedures (including critical care time)  Medications Ordered in ED Medications  ibuprofen (ADVIL,MOTRIN) tablet 400 mg (400 mg Oral Given 09/30/17 1855)     Initial Impression / Assessment and Plan / ED Course  I have reviewed  the triage vital signs and the nursing notes.  Pertinent labs & imaging results that were available during my care of the patient were reviewed by me and considered in my medical decision making (see chart for details).     Patient with left wrist, left rib pain after a tree fell on it a week ago.  He stated it was weird because the wrist did not hurt initially but started hurting few days after.  He does have history of gout, question whether this is a wrist contusion versus sprain versus gout.  He does not have a fever, I am not concerned about septic joint.  He is neurovascularly intact.  Will splint, will have patient follow-up with family doctor for recheck, since on Coumadin, unable to prescribe NSAIDs, history of liver problems and alcohol abuse, unable to take Tylenol  Vitals:   09/30/17 1851  BP: 120/74  Pulse: 61  Resp: 18  Temp: 98.5 F (36.9 C)  TempSrc: Oral  SpO2: 100%  Height: 5\' 6"  (1.676 m)     Final Clinical Impressions(s) / ED Diagnoses   Final diagnoses:  Left wrist pain  Contusion of rib on left side, initial encounter    ED Discharge Orders    None       Jeannett Senior, PA-C 09/30/17 2314    Quintella Reichert, MD 10/02/17 1441

## 2017-10-03 ENCOUNTER — Other Ambulatory Visit: Payer: Self-pay | Admitting: Internal Medicine

## 2017-10-07 ENCOUNTER — Encounter: Payer: Medicare Other | Admitting: Internal Medicine

## 2017-10-11 ENCOUNTER — Ambulatory Visit: Payer: Medicare Other

## 2017-10-11 ENCOUNTER — Ambulatory Visit (INDEPENDENT_AMBULATORY_CARE_PROVIDER_SITE_OTHER): Payer: Medicare Other | Admitting: Pharmacist

## 2017-10-11 DIAGNOSIS — Z7901 Long term (current) use of anticoagulants: Secondary | ICD-10-CM | POA: Diagnosis not present

## 2017-10-11 DIAGNOSIS — I4891 Unspecified atrial fibrillation: Secondary | ICD-10-CM | POA: Diagnosis present

## 2017-10-11 DIAGNOSIS — Z5181 Encounter for therapeutic drug level monitoring: Secondary | ICD-10-CM | POA: Diagnosis not present

## 2017-10-11 LAB — POCT INR: INR: 2.8

## 2017-10-11 NOTE — Patient Instructions (Signed)
   Patient instructed to take medications as defined in the Anti-coagulation Track section of this encounter.  Patient instructed to take today's dose.  Patient instructed to take  one (1) tablet of your 2.5mg  green-colored warfarin tablets all days of the week--EXCEPT on Mondays, Wednesdays and Fridays take only one-half (1/2) tablet. Repeat this regimen weekly. Patient verbalized understanding of these instructions.

## 2017-10-11 NOTE — Progress Notes (Signed)
Anticoagulation Management [name] is a 72 y.o. male who reports to the clinic for monitoring of warfarin treatment.    Indication: atrial fibrillation   Duration: indefinite Supervising physician: [facility] Visit History: Patient does not report signs/symptoms of bleeding or thromboembolism  Other recent changes: No diet, medications, lifestyle changes endorsed by the patient at this visit.  Anticoagulation Episode Summary    Current INR goal:   2.0-3.0  TTR:   67.2 % (4.9 y)  Next INR check:   11/08/2017  INR from last check:   2.80 (10/11/2017)  Weekly max warfarin dose:     Target end date:   Indefinite  INR check location:   Anticoagulation Clinic  Preferred lab:     Send INR reminders to:      Indications   Atrial fibrillation ([place]) [I48.91]       Comments:           No Known Allergies Prior to Admission medications   Medication Sig Start Date End Date Taking? Authorizing Provider  albuterol (PROAIR HFA) 108 (90 Base) MCG/ACT inhaler Inhale 1-2 puffs into the lungs every 6 (six) hours as needed for wheezing or shortness of breath. PLACE ON HOLD UNTIL PATIENT REQUESTS TO FILL 09/27/15  Yes Rabbani, Ricarda Frame, MD  amiodarone (PACERONE) 200 MG tablet Take 1 tablet (200 mg total) by mouth daily. 03/24/17  Yes Jule Ser, DO  carvedilol (COREG) 12.5 MG tablet TAKE 1 TABLET BY MOUTH EVERY MORNING 10/04/17  Yes Jule Ser, DO  colchicine 0.6 MG tablet Take 1 tablet (0.6 mg total) by mouth daily. Take 1 tablet by mouth daily until symptoms resolve. 08/17/17  Yes Jule Ser, DO  febuxostat (ULORIC) 40 MG tablet Take 1 tablet (40 mg total) by mouth daily. 11/26/16  Yes Jule Ser, DO  fluticasone (FLOVENT HFA) 44 MCG/ACT inhaler Inhale 2 puffs into the lungs 2 (two) times daily.   Yes [provider]  folic acid (FOLVITE) 1 MG tablet Take 1 tablet (1 mg total) by mouth daily. 08/03/17  Yes Jule Ser, DO   furosemide (LASIX) 40 MG tablet Take 1 tablet (40 mg total) by mouth daily. 01/19/17  Yes Jule Ser, DO  hydrocortisone (ANUSOL-HC) 25 MG suppository Place 1 suppository (25 mg total) rectally at bedtime. 02/16/17  Yes Helberg, Larkin Ina, MD  hydroxyurea (HYDREA) 500 MG capsule take 2 capsules by mouth once daily (MAY TAKE WITH FOOD TO MINIMIZE GI SIDE EFFECTS) 09/03/17  Yes Sid Falcon, MD  SUBOXONE 8-2 MG FILM Place 1 Film under the tongue daily.  08/21/15  Yes [provider]  tiotropium (SPIRIVA HANDIHALER) 18 MCG inhalation capsule Place 1 capsule (18 mcg total) into inhaler and inhale daily. PLACE ON HOLD UNTIL PATIENT REQUESTS TO FILL 09/27/15  Yes Juluis Mire, MD  warfarin (COUMADIN) 2.5 MG tablet Take one-half tablet by mouth on Mondays and Thursdays. All other days, take 1 tablet by mouth. 09/03/17  Yes Sid Falcon, MD   Past Medical History:  Diagnosis Date  . Acute lower GI bleeding 02/15/2017  . Atrial fibrillation /flutter    hx of flutter ablation 2/12  . Cardiomyopathy    EF55% 11/14<<35%   . CHF (congestive heart failure) ([place])   . COPD (chronic obstructive pulmonary disease) ([place])   . GERD (gastroesophageal reflux disease)   . Gout   . Hepatitis    "think I had that once; a long time ago; from dirty needles I think" (12/14/2012)  .  Hypertension   . Leukocytosis, unspecified 06/12/2013  . 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 Obesity   . Pneumonia X 1  . Primary polycythemia ([place]) 06/12/2013  . Renal insufficiency   . Shortness of breath    "all the time lately" (12/14/2012)  . Substance abuse ([place])    alcohol; hx cocaine, heroin, crack use  . Syncope    Social History   Socioeconomic History  . Marital status: Single    Spouse name: Not on file  . Number of children: Not on file  . Years of education: Not on file  . Highest education level: Not on file  Occupational History  . Occupation: Retired  Scientific laboratory technician  . Financial  resource strain: Not on file  . Food insecurity:    Worry: Not on file    Inability: Not on file  . Transportation needs:    Medical: Not on file    Non-medical: Not on file  Tobacco Use  . Smoking status: Current Every Day Smoker    Packs/day: 0.10    Years: 50.00    Pack years: 5.00    Types: Cigarettes  . Smokeless tobacco: Never Used  Substance and Sexual Activity  . Alcohol use: Yes    Alcohol/week: 53.4 oz    Types: 12 Cans of beer, 77 Shots of liquor per week    Comment: 02/15/2017 "a pint of vodka/day" plus 12 pack of beer/week"  . Drug use: Yes    Types: "Crack" cocaine, Cocaine, Heroin    Comment: 02/15/2017 "last used crack 4-5 years ago; last used heroin a couple years ago; currently denies any drug use-- on suboxone.   . Sexual activity: Not Currently  Lifestyle  . Physical activity:    Days per week: Not on file    Minutes per session: Not on file  . Stress: Not on file  Relationships  . Social connections:    Talks on phone: Not on file    Gets together: Not on file    Attends religious service: Not on file    Active member of club or organization: Not on file    Attends meetings of clubs or organizations: Not on file    Relationship status: Not on file  Other Topics Concern  . Not on file  Social History Narrative   Moved here from [place]. Lives with common law wife and grandchildren. He is retired from "general labor."   Family History  Problem Relation Age of Onset  . Diabetes Mother   . Hypertension Mother   . Cirrhosis Father   . Alcohol abuse Father   . Colon cancer Neg Hx   . Rectal cancer Neg Hx   . Stomach cancer Neg Hx     ASSESSMENT Recent Results: The most recent result is correlated with 13.75 mg per week: Lab Results  Component Value Date   INR 2.80 10/11/2017   INR 3.2 09/20/2017   INR 2.30 08/16/2017    Anticoagulation Dosing: Description   Take one (1) tablet of your 2.5mg  green-colored warfarin tablets all days of the  week--EXCEPT on Mondays, Wednesdays and Fridays take only one-half (1/2) tablet. Repeat this regimen weekly.     INR today: Therapeutic  PLAN Weekly dose was unchanged.  Patient Instructions    Patient instructed to take medications as defined in the Anti-coagulation Track section of this encounter.  Patient instructed to take today's dose.  Patient instructed to take  one (1) tablet of your 2.5mg  green-colored warfarin  tablets all days of the week--EXCEPT on Mondays, Wednesdays and Fridays take only one-half (1/2) tablet. Repeat this regimen weekly. Patient verbalized understanding of these instructions.     Patient advised to contact clinic or seek medical attention if signs/symptoms of bleeding or thromboembolism occur.  Patient verbalized understanding by repeating back information and was advised to contact me if further medication-related questions arise. Patient was also provided an information handout.  Follow-up Return in 1 month (on 11/08/2017) for Follow up INR at 0900h.  Pennie Banter, PharmD, CACP, CPP  15 minutes spent face-to-face with the patient during the encounter. 50% of time spent on education. 50% of time was spent on fingerstick point of care INR sample collection, processing, results determination, and documentation in [email or web address].

## 2017-10-27 ENCOUNTER — Other Ambulatory Visit: Payer: Self-pay | Admitting: Internal Medicine

## 2017-10-27 DIAGNOSIS — I4891 Unspecified atrial fibrillation: Secondary | ICD-10-CM

## 2017-11-02 ENCOUNTER — Other Ambulatory Visit: Payer: Self-pay | Admitting: Internal Medicine

## 2017-11-04 ENCOUNTER — Telehealth: Payer: Self-pay | Admitting: Internal Medicine

## 2017-11-04 ENCOUNTER — Other Ambulatory Visit: Payer: Self-pay | Admitting: Internal Medicine

## 2017-11-04 NOTE — Telephone Encounter (Signed)
  Reason for call:   I placed an outgoing call to [name] at 10:25 AM regarding his medication febuxostat.   Assessment/ Plan:   In May 2018, patient was started on Febuxostat due to interactions between Allopurinol and his other medications.  He was given a 60-day supply.  His uric acid level at that time was 11.    In July his Uric Acid level was 6.  In April 2019, the FDA has issued a Boxed Warning and a new patient Medication Guide to febuxostat ([place]). Based on The Cardiovascular Safety of Febuxostat and Allopurinol in Patients with Gout and Cardiovascular Morbidities (CARES) trial, febuxostat is associated with increased risk of cardiovascular deaths and death from all causes in patients with established cardiovascular conditions.   I called patient to discuss given this warning and that he has not received any refills since his initial prescription 11 months prior.    He reported that he was not taking this medication any longer and had not done so for greater than 30 days.  He reported no issues with gout currently and is doing well.  I removed medication from his list and will attempt to have patient scheduled in the near future for follow up in our clinic.     Jule Ser, DO   11/04/2017, 10:25 AM

## 2017-11-08 ENCOUNTER — Ambulatory Visit: Payer: Medicare Other

## 2017-11-19 ENCOUNTER — Other Ambulatory Visit: Payer: Self-pay | Admitting: Hematology and Oncology

## 2017-11-19 ENCOUNTER — Inpatient Hospital Stay: Payer: Medicare Other | Attending: Hematology and Oncology

## 2017-11-19 ENCOUNTER — Telehealth: Payer: Self-pay | Admitting: Hematology and Oncology

## 2017-11-19 ENCOUNTER — Encounter: Payer: Self-pay | Admitting: Hematology and Oncology

## 2017-11-19 ENCOUNTER — Inpatient Hospital Stay (HOSPITAL_BASED_OUTPATIENT_CLINIC_OR_DEPARTMENT_OTHER): Payer: Medicare Other | Admitting: Hematology and Oncology

## 2017-11-19 DIAGNOSIS — Z79899 Other long term (current) drug therapy: Secondary | ICD-10-CM | POA: Insufficient documentation

## 2017-11-19 DIAGNOSIS — Z7901 Long term (current) use of anticoagulants: Secondary | ICD-10-CM

## 2017-11-19 DIAGNOSIS — T451X5A Adverse effect of antineoplastic and immunosuppressive drugs, initial encounter: Secondary | ICD-10-CM | POA: Diagnosis not present

## 2017-11-19 DIAGNOSIS — F17210 Nicotine dependence, cigarettes, uncomplicated: Secondary | ICD-10-CM | POA: Diagnosis not present

## 2017-11-19 DIAGNOSIS — D45 Polycythemia vera: Secondary | ICD-10-CM | POA: Diagnosis present

## 2017-11-19 DIAGNOSIS — F1020 Alcohol dependence, uncomplicated: Secondary | ICD-10-CM | POA: Insufficient documentation

## 2017-11-19 DIAGNOSIS — D701 Agranulocytosis secondary to cancer chemotherapy: Secondary | ICD-10-CM

## 2017-11-19 DIAGNOSIS — Z720 Tobacco use: Secondary | ICD-10-CM

## 2017-11-19 DIAGNOSIS — M109 Gout, unspecified: Secondary | ICD-10-CM | POA: Insufficient documentation

## 2017-11-19 LAB — CBC WITH DIFFERENTIAL/PLATELET
BASOS PCT: 1 %
Basophils Absolute: 0 10*3/uL (ref 0.0–0.1)
Eosinophils Absolute: 0.1 10*3/uL (ref 0.0–0.5)
Eosinophils Relative: 2 %
HEMATOCRIT: 38.2 % — AB (ref 38.4–49.9)
HEMOGLOBIN: 13.2 g/dL (ref 13.0–17.1)
LYMPHS PCT: 30 %
Lymphs Abs: 1.1 10*3/uL (ref 0.9–3.3)
MCH: 44.4 pg — ABNORMAL HIGH (ref 27.2–33.4)
MCHC: 34.6 g/dL (ref 32.0–36.0)
MCV: 128.6 fL — ABNORMAL HIGH (ref 79.3–98.0)
MONOS PCT: 9 %
Monocytes Absolute: 0.3 10*3/uL (ref 0.1–0.9)
NEUTROS ABS: 2 10*3/uL (ref 1.5–6.5)
Neutrophils Relative %: 58 %
Platelets: 156 10*3/uL (ref 140–400)
RBC: 2.97 MIL/uL — ABNORMAL LOW (ref 4.20–5.82)
RDW: 13.8 % (ref 11.0–14.6)
WBC: 3.6 10*3/uL — ABNORMAL LOW (ref 4.0–10.3)

## 2017-11-19 NOTE — Assessment & Plan Note (Signed)
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

## 2017-11-19 NOTE — Progress Notes (Signed)
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 He tolerated the hydroxyurea well.  He will continue taking hydroxyurea 1000 mg daily I recommend we continue the same and I will see him back in 6 months with repeat history, physical examination and blood work   Tobacco abuse I spent some time counseling the patient the importance of tobacco cessation. He is currently attempting to quit on his own   Alcoholism [facility]) He has significant alcoholism, drinking half a bottle Vodca 3 times a week I think that would have contributed to recurrent gout and his leukopenia I explained to the patient's the importance of staying abstinent from alcoholism, especially in view that he is on chronic anticoagulation therapy and on hydroxyurea  Chronic anticoagulation The patient denies any recent signs or symptoms of bleeding such as spontaneous epistaxis, hematuria or hematochezia. he will continue current medical management. I recommend close follow-up with primary care doctor for medication adjustment.  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   INTERVAL HISTORY: Please see below for problem oriented charting. He returns for further follow-up He denies recent infection The patient denies any recent signs or symptoms of bleeding such as spontaneous epistaxis, hematuria or hematochezia. The patient continued to smoke about 2 cigarettes/day and half a pint of vodka every other day. He denies symptoms of chest pain, shortness of breath or cough He stated he is compliant taking  all his medications as prescribed  SUMMARY OF ONCOLOGIC HISTORY:  This is a pleasant gentleman who is being referred here because of high hemoglobin level. In November 2014, we removed one unit of blood and order an additional workup to rule out myeloproliferative disorder. Blood test result confirmed low erythropoietin level along with detectable JAK 2 mutation, confirmed myeloproliferative disorder Unfortunately, bone marrow biopsy on 06/26/2013 was nondiagnostic On 07/31/13 the patient was started on 1 hydroxyurea a day On 08/15/2013, increased hydroxyurea to 2 tablets a day and remove one more unit of blood due to the high hemoglobin On 09/05/2013, I increased hydroxyurea to 3 tablets a day and remove one more unit of blood due to high hemoglobin and hematocrit On 01/23/2014, dose of hydroxyurea is reduced to 2 tablets a day due to anemia. On 04/23/2015, dose of hydroxyurea is adjusted to 500 mg on Mondays, Wednesdays and Fridays and to take 1000 mg for the rest of the week Subsequently, he was placed on hydroxyurea 1000 mg daily.  He is placed on warfarin therapy for chronic atrial fibrillation.  REVIEW OF SYSTEMS:   Constitutional: Denies fevers, chills or abnormal weight loss Eyes: Denies blurriness of vision Ears, nose, mouth, throat, and face: Denies mucositis or sore throat Respiratory: Denies cough, dyspnea or wheezes Cardiovascular: Denies palpitation, chest discomfort or lower extremity swelling Gastrointestinal:  Denies nausea, heartburn or change in bowel habits Skin: Denies abnormal skin rashes Lymphatics: Denies new lymphadenopathy or easy bruising Neurological:Denies numbness, tingling or new weaknesses Behavioral/Psych: Mood is stable, no new changes  All other systems were reviewed with the patient and are negative.  I have reviewed the past medical history, past surgical history, social history and family history with the patient  and they are unchanged from previous  note.  ALLERGIES:  has No Known Allergies.  MEDICATIONS:  Current Outpatient Medications  Medication Sig Dispense Refill  . albuterol (PROAIR HFA) 108 (90 Base) MCG/ACT inhaler Inhale 1-2 puffs into the lungs every 6 (six) hours as needed for wheezing or shortness of breath. PLACE ON HOLD UNTIL PATIENT REQUESTS TO FILL 18 g 3  . amiodarone (PACERONE) 200 MG tablet TAKE 1 TABLET BY MOUTH ONCE DAILY 30 tablet 11  . carvedilol (COREG) 12.5 MG tablet TAKE 1 TABLET BY MOUTH EVERY MORNING 30 tablet 5  . fluticasone (FLOVENT HFA) 44 MCG/ACT inhaler Inhale 2 puffs into the lungs 2 (two) times daily.    . folic acid (FOLVITE) 1 MG tablet Take 1 tablet (1 mg total) by mouth daily. 90 tablet 3  . furosemide (LASIX) 40 MG tablet Take 1 tablet (40 mg total) by mouth daily. 90 tablet 3  . hydrocortisone (ANUSOL-HC) 25 MG suppository Place 1 suppository (25 mg total) rectally at bedtime. 12 suppository 0  . hydroxyurea (HYDREA) 500 MG capsule take 2 capsules by mouth once daily (MAY TAKE WITH FOOD TO MINIMIZE GI SIDE EFFECTS) 60 capsule 3  . SUBOXONE 8-2 MG FILM Place 1 Film under the tongue daily.     [name] tiotropium (SPIRIVA HANDIHALER) 18 MCG inhalation capsule Place 1 capsule (18 mcg total) into inhaler and inhale daily. PLACE ON HOLD UNTIL PATIENT REQUESTS TO FILL 30 capsule 11  . warfarin (COUMADIN) 2.5 MG tablet Take one-half tablet by mouth on Mondays and Thursdays. All other days, take 1 tablet by mouth. 28 tablet 2   No current facility-administered medications for this visit.     PHYSICAL EXAMINATION: ECOG PERFORMANCE STATUS: 1 - Symptomatic but completely ambulatory  Vitals:   11/19/17 0819  BP: (!) 117/57  Pulse: (!) 45  Resp: 18  Temp: 98.5 F (36.9 C)  SpO2: 93%   Filed Weights   11/19/17 0819  Weight: 210 lb 8 oz (95.5 kg)    GENERAL:alert, no distress and comfortable.  He is obese SKIN: skin color, texture, turgor are normal, no rashes or significant lesions EYES: normal,  Conjunctiva are pink and non-injected, sclera clear OROPHARYNX:no exudate, no erythema and lips, buccal mucosa, and tongue normal  NECK: supple, thyroid normal size, non-tender, without nodularity LYMPH:  no palpable lymphadenopathy in the cervical, axillary or inguinal LUNGS: He has occasional scattered expiratory wheezes HEART: regular rate & rhythm and no murmurs and no lower extremity edema ABDOMEN:abdomen soft, non-tender and normal bowel sounds Musculoskeletal:no cyanosis of digits and no clubbing  NEURO: alert & oriented x 3 with fluent speech, no focal motor/sensory deficits  LABORATORY DATA:  I have reviewed the data as listed    Component Value Date/Time   NA 137 02/16/2017 0446   NA 142 11/26/2016 1352   NA 143 06/12/2013 1125   K 3.6 02/16/2017 0446   K 3.7 06/12/2013 1125   CL 101 02/16/2017 0446   CO2 27 02/16/2017 0446   CO2 32 (H) 06/12/2013 1125   GLUCOSE 87 02/16/2017 0446   GLUCOSE 92 06/12/2013 1125   BUN 21 (H) 02/16/2017 0446   BUN 20 11/26/2016 1352   BUN 14.6 06/12/2013 1125   CREATININE 1.38 (H) 02/16/2017 0446   CREATININE 1.05 08/30/2014 1628   CREATININE 0.9 06/12/2013 1125   CALCIUM 7.7 (L) 02/16/2017 0446   CALCIUM 8.9 06/12/2013 1125   PROT 6.8 11/26/2016 1352   PROT 6.9 06/12/2013 1125   ALBUMIN  3.8 11/26/2016 1352   ALBUMIN 3.3 (L) 06/12/2013 1125   AST 27 11/26/2016 1352   AST 21 06/12/2013 1125   ALT 34 11/26/2016 1352   ALT 28 06/12/2013 1125   ALKPHOS 102 11/26/2016 1352   ALKPHOS 112 06/12/2013 1125   BILITOT 0.4 11/26/2016 1352   BILITOT 0.82 06/12/2013 1125   GFRNONAA 51 (L) 02/16/2017 0446   GFRNONAA 73 08/30/2014 1628   GFRAA 59 (L) 02/16/2017 0446   GFRAA 84 08/30/2014 1628    No results found for: SPEP, UPEP  Lab Results  Component Value Date   WBC 3.6 (L) 11/19/2017   NEUTROABS 2.0 11/19/2017   HGB 13.2 11/19/2017   HCT 38.2 (L) 11/19/2017   MCV 128.6 (H) 11/19/2017   PLT 156 11/19/2017      Chemistry       Component Value Date/Time   NA 137 02/16/2017 0446   NA 142 11/26/2016 1352   NA 143 06/12/2013 1125   K 3.6 02/16/2017 0446   K 3.7 06/12/2013 1125   CL 101 02/16/2017 0446   CO2 27 02/16/2017 0446   CO2 32 (H) 06/12/2013 1125   BUN 21 (H) 02/16/2017 0446   BUN 20 11/26/2016 1352   BUN 14.6 06/12/2013 1125   CREATININE 1.38 (H) 02/16/2017 0446   CREATININE 1.05 08/30/2014 1628   CREATININE 0.9 06/12/2013 1125      Component Value Date/Time   CALCIUM 7.7 (L) 02/16/2017 0446   CALCIUM 8.9 06/12/2013 1125   ALKPHOS 102 11/26/2016 1352   ALKPHOS 112 06/12/2013 1125   AST 27 11/26/2016 1352   AST 21 06/12/2013 1125   ALT 34 11/26/2016 1352   ALT 28 06/12/2013 1125   BILITOT 0.4 11/26/2016 1352   BILITOT 0.82 06/12/2013 1125      All questions were answered. The patient knows to call the clinic with any problems, questions or concerns. No barriers to learning was detected.  I spent 15 minutes counseling the patient face to face. The total time spent in the appointment was 20 minutes and more than 50% was on counseling and review of test results  Heath Lark, MD 11/19/2017 8:24 AM

## 2017-11-19 NOTE — Assessment & Plan Note (Signed)
The patient has JAK2 mutation positive myeloproliferative disorder. He tolerated the hydroxyurea well.  He will continue taking hydroxyurea 1000 mg daily I recommend we continue the same and I will see him back in 6 months with repeat history, physical examination and blood work

## 2017-11-19 NOTE — Assessment & Plan Note (Signed)
He has significant alcoholism, drinking half a bottle Vodca 3 times a week I think that would have contributed to recurrent gout and his leukopenia I explained to the patient's the importance of staying abstinent from alcoholism, especially in view that he is on chronic anticoagulation therapy and on hydroxyurea

## 2017-11-19 NOTE — Assessment & Plan Note (Signed)
I spent some time counseling the patient the importance of tobacco cessation. He is currently attempting to quit on his own.  

## 2017-11-19 NOTE — Assessment & Plan Note (Signed)
The patient denies any recent signs or symptoms of bleeding such as spontaneous epistaxis, hematuria or hematochezia. he will continue current medical management. I recommend close follow-up with primary care doctor for medication adjustment.

## 2017-11-19 NOTE — Telephone Encounter (Signed)
Gave patient AVs and calendar of upcoming November appointments.  °

## 2017-11-22 ENCOUNTER — Ambulatory Visit (INDEPENDENT_AMBULATORY_CARE_PROVIDER_SITE_OTHER): Payer: Medicare Other | Admitting: Pharmacist

## 2017-11-22 DIAGNOSIS — Z7901 Long term (current) use of anticoagulants: Secondary | ICD-10-CM

## 2017-11-22 DIAGNOSIS — I4891 Unspecified atrial fibrillation: Secondary | ICD-10-CM | POA: Diagnosis present

## 2017-11-22 DIAGNOSIS — Z5181 Encounter for therapeutic drug level monitoring: Secondary | ICD-10-CM

## 2017-11-22 LAB — POCT INR: INR: 2.2

## 2017-11-22 NOTE — Progress Notes (Signed)
Anticoagulation Management [name] is a 72 y.o. male who reports to the clinic for monitoring of warfarin treatment.    Indication: atrial fibrillation  Duration: indefinite Supervising physician: Aldine Contes  Anticoagulation Clinic Visit History: Patient does not report signs/symptoms of bleeding or thromboembolism  Other recent changes: No diet, medications, lifestyle changes endorsed by the patient to me at this visit.  Anticoagulation Episode Summary    Current INR goal:   2.0-3.0  TTR:   68.0 % (5 y)  Next INR check:   12/20/2017  INR from last check:     Weekly max warfarin dose:     Target end date:   Indefinite  INR check location:   Anticoagulation Clinic  Preferred lab:     Send INR reminders to:      Indications   Atrial fibrillation ([place]) [I48.91]       Comments:           No Known Allergies Prior to Admission medications   Medication Sig Start Date End Date Taking? Authorizing Provider  albuterol (PROAIR HFA) 108 (90 Base) MCG/ACT inhaler Inhale 1-2 puffs into the lungs every 6 (six) hours as needed for wheezing or shortness of breath. PLACE ON HOLD UNTIL PATIENT REQUESTS TO FILL 09/27/15  Yes Rabbani, Ricarda Frame, MD  amiodarone (PACERONE) 200 MG tablet TAKE 1 TABLET BY MOUTH ONCE DAILY 10/28/17  Yes Jule Ser, DO  carvedilol (COREG) 12.5 MG tablet TAKE 1 TABLET BY MOUTH EVERY MORNING 10/04/17  Yes Jule Ser, DO  fluticasone (FLOVENT HFA) 44 MCG/ACT inhaler Inhale 2 puffs into the lungs 2 (two) times daily.   Yes [provider]  folic acid (FOLVITE) 1 MG tablet Take 1 tablet (1 mg total) by mouth daily. 08/03/17  Yes Jule Ser, DO  furosemide (LASIX) 40 MG tablet Take 1 tablet (40 mg total) by mouth daily. 01/19/17  Yes Jule Ser, DO  hydroxyurea (HYDREA) 500 MG capsule take 2 capsules by mouth once daily (MAY TAKE WITH FOOD TO MINIMIZE GI SIDE EFFECTS) 09/03/17  Yes Sid Falcon, MD  SUBOXONE 8-2 MG FILM Place 1 Film  under the tongue daily.  08/21/15  Yes [provider]  tiotropium (SPIRIVA HANDIHALER) 18 MCG inhalation capsule Place 1 capsule (18 mcg total) into inhaler and inhale daily. PLACE ON HOLD UNTIL PATIENT REQUESTS TO FILL 09/27/15  Yes Juluis Mire, MD  warfarin (COUMADIN) 2.5 MG tablet Take one-half tablet by mouth on Mondays and Thursdays. All other days, take 1 tablet by mouth. 09/03/17  Yes Sid Falcon, MD  hydrocortisone (ANUSOL-HC) 25 MG suppository Place 1 suppository (25 mg total) rectally at bedtime. Patient not taking: Reported on 11/22/2017 02/16/17   Ina Homes, MD   Past Medical History:  Diagnosis Date  . Acute lower GI bleeding 02/15/2017  . Atrial fibrillation /flutter    hx of flutter ablation 2/12  . Cardiomyopathy    EF55% 11/14<<35%   . CHF (congestive heart failure) ([place])   . COPD (chronic obstructive pulmonary disease) ([place])   . GERD (gastroesophageal reflux disease)   . Gout   . Hepatitis    "think I had that once; a long time ago; from dirty needles I think" (12/14/2012)  . Hypertension   . Leukocytosis, unspecified 06/12/2013  . Myeloproliferative neoplasm ([place]) 08/15/2013  . Obesity   . Pneumonia X 1  . Primary polycythemia ([place]) 06/12/2013  . Renal insufficiency   . Shortness of breath    "all the time lately" (12/14/2012)  .  Substance abuse ([place])    alcohol; hx cocaine, heroin, crack use  . Syncope    Social History   Socioeconomic History  . Marital status: Single    Spouse name: Not on file  . Number of children: Not on file  . Years of education: Not on file  . Highest education level: Not on file  Occupational History  . Occupation: Retired  Scientific laboratory technician  . Financial resource strain: Not on file  . Food insecurity:    Worry: Not on file    Inability: Not on file  . Transportation needs:    Medical: Not on file    Non-medical: Not on file  Tobacco Use  . Smoking status: Current Every Day Smoker    Packs/day: 0.10    Years:  50.00    Pack years: 5.00    Types: Cigarettes  . Smokeless tobacco: Never Used  Substance and Sexual Activity  . Alcohol use: Yes    Alcohol/week: 53.4 oz    Types: 12 Cans of beer, 77 Shots of liquor per week    Comment: 02/15/2017 "a pint of vodka/day" plus 12 pack of beer/week"  . Drug use: Yes    Types: "Crack" cocaine, Cocaine, Heroin    Comment: 02/15/2017 "last used crack 4-5 years ago; last used heroin a couple years ago; currently denies any drug use-- on suboxone.   . Sexual activity: Not Currently  Lifestyle  . Physical activity:    Days per week: Not on file    Minutes per session: Not on file  . Stress: Not on file  Relationships  . Social connections:    Talks on phone: Not on file    Gets together: Not on file    Attends religious service: Not on file    Active member of club or organization: Not on file    Attends meetings of clubs or organizations: Not on file    Relationship status: Not on file  Other Topics Concern  . Not on file  Social History Narrative   Moved here from [place]. Lives with common law wife and grandchildren. He is retired from "general labor."   Family History  Problem Relation Age of Onset  . Diabetes Mother   . Hypertension Mother   . Cirrhosis Father   . Alcohol abuse Father   . Colon cancer Neg Hx   . Rectal cancer Neg Hx   . Stomach cancer Neg Hx     ASSESSMENT Recent Results: The most recent result is correlated with 13.75 mg per week: Lab Results  Component Value Date   INR 2.20 11/22/2017   INR 2.80 10/11/2017   INR 3.2 09/20/2017    Anticoagulation Dosing: Description   Take one (1) tablet of your 2.5mg  green-colored warfarin tablets all days of the week--EXCEPT on Mondays, Wednesdays and Fridays take only one-half (1/2) tablet. Repeat this regimen weekly.     INR today: Therapeutic  PLAN Weekly dose was unchanged.  Patient Instructions  Patient instructed to take medications as defined in the  Anti-coagulation Track section of this encounter.  Patient instructed to take today's dose.  Patient instructed to take one (1) tablet of your 2.5mg  green-colored warfarin tablets all days of the week--EXCEPT on Mondays, Wednesdays and Fridays take only one-half (1/2) tablet. Repeat this regimen weekly. Patient verbalized understanding of these instructions.     Patient advised to contact clinic or seek medical attention if signs/symptoms of bleeding or thromboembolism occur.  Patient verbalized understanding by  repeating back information and was advised to contact me if further medication-related questions arise. Patient was also provided an information handout.  Follow-up Return in about 1 month (around 12/20/2017) for Follow up INR at 0845h.  Pennie Banter, PharmD, CACP, CPP  15 minutes spent face-to-face with the patient during the encounter. 50% of time spent on education. 50% of time was spent on fingerstick point of care INR sample collection, processing, results determination and documentation in [email or web address].

## 2017-11-22 NOTE — Patient Instructions (Signed)
Patient instructed to take medications as defined in the Anti-coagulation Track section of this encounter.  Patient instructed to take today's dose.  Patient instructed to take one (1) tablet of your 2.5mg  green-colored warfarin tablets all days of the week--EXCEPT on Mondays, Wednesdays and Fridays take only one-half (1/2) tablet. Repeat this regimen weekly. Patient verbalized understanding of these instructions.

## 2017-11-22 NOTE — Progress Notes (Signed)
INTERNAL MEDICINE TEACHING ATTENDING ADDENDUM - Buel Molder M.D  Duration- indefinite, Indication- afib, INR- therapeutic. Agree with pharmacy recommendations as outlined in their note.     

## 2017-12-08 ENCOUNTER — Other Ambulatory Visit: Payer: Self-pay | Admitting: Internal Medicine

## 2017-12-08 DIAGNOSIS — I4891 Unspecified atrial fibrillation: Secondary | ICD-10-CM

## 2017-12-14 ENCOUNTER — Other Ambulatory Visit: Payer: Self-pay | Admitting: Internal Medicine

## 2017-12-14 DIAGNOSIS — I4891 Unspecified atrial fibrillation: Secondary | ICD-10-CM

## 2017-12-20 ENCOUNTER — Ambulatory Visit: Payer: Medicare Other

## 2017-12-27 ENCOUNTER — Other Ambulatory Visit: Payer: Self-pay | Admitting: Pharmacist

## 2017-12-27 ENCOUNTER — Ambulatory Visit (INDEPENDENT_AMBULATORY_CARE_PROVIDER_SITE_OTHER): Payer: Medicare Other | Admitting: Pharmacist

## 2017-12-27 DIAGNOSIS — Z7901 Long term (current) use of anticoagulants: Secondary | ICD-10-CM | POA: Diagnosis not present

## 2017-12-27 DIAGNOSIS — I4891 Unspecified atrial fibrillation: Secondary | ICD-10-CM

## 2017-12-27 DIAGNOSIS — J441 Chronic obstructive pulmonary disease with (acute) exacerbation: Secondary | ICD-10-CM

## 2017-12-27 LAB — POCT INR: INR: 2.9 (ref 2.0–3.0)

## 2017-12-27 NOTE — Progress Notes (Signed)
Anticoagulation Management [name] is a 72 y.o. male who reports to the clinic for monitoring of warfarin treatment.    Indication: atrial fibrillation.  Duration: indefinite Supervising physician: Aldine Contes  Anticoagulation Clinic Visit History: Patient does not report signs/symptoms of bleeding or thromboembolism  Other recent changes: No diet, medications, lifestyle changes cited.  Anticoagulation Episode Summary    Current INR goal:   2.0-3.0  TTR:   68.6 % (5.1 y)  Next INR check:   01/24/2018  INR from last check:   2.9 (12/27/2017)  Weekly max warfarin dose:     Target end date:   Indefinite  INR check location:   Anticoagulation Clinic  Preferred lab:     Send INR reminders to:      Indications   Atrial fibrillation ([place]) [I48.91]       Comments:           No Known Allergies Prior to Admission medications   Medication Sig Start Date End Date Taking? Authorizing Provider  albuterol (PROAIR HFA) 108 (90 Base) MCG/ACT inhaler Inhale 1-2 puffs into the lungs every 6 (six) hours as needed for wheezing or shortness of breath. PLACE ON HOLD UNTIL PATIENT REQUESTS TO FILL 09/27/15  Yes Rabbani, Ricarda Frame, MD  amiodarone (PACERONE) 200 MG tablet TAKE 1 TABLET BY MOUTH ONCE DAILY 10/28/17  Yes Jule Ser, DO  carvedilol (COREG) 12.5 MG tablet TAKE 1 TABLET BY MOUTH EVERY MORNING 10/04/17  Yes Jule Ser, DO  fluticasone (FLOVENT HFA) 44 MCG/ACT inhaler Inhale 2 puffs into the lungs 2 (two) times daily.   Yes [provider]  folic acid (FOLVITE) 1 MG tablet Take 1 tablet (1 mg total) by mouth daily. 08/03/17  Yes Jule Ser, DO  furosemide (LASIX) 40 MG tablet Take 1 tablet (40 mg total) by mouth daily. 01/19/17  Yes Jule Ser, DO  hydroxyurea (HYDREA) 500 MG capsule take 2 capsules by mouth once daily (MAY TAKE WITH FOOD TO MINIMIZE GI SIDE EFFECTS) 09/03/17  Yes Sid Falcon, MD  SUBOXONE 8-2 MG FILM Place 1 Film under the tongue  daily.  08/21/15  Yes [provider]  tiotropium (SPIRIVA HANDIHALER) 18 MCG inhalation capsule Place 1 capsule (18 mcg total) into inhaler and inhale daily. PLACE ON HOLD UNTIL PATIENT REQUESTS TO FILL 09/27/15  Yes Juluis Mire, MD  warfarin (COUMADIN) 2.5 MG tablet Take one (1) tablet all days of the week--EXCEPT on Mondays, Wednesdays and Fridays take only one-half (1/2) tablet. 12/09/17  Yes Jule Ser, DO  hydrocortisone (ANUSOL-HC) 25 MG suppository Place 1 suppository (25 mg total) rectally at bedtime. Patient not taking: Reported on 11/22/2017 02/16/17   Ina Homes, MD   Past Medical History:  Diagnosis Date  . Acute lower GI bleeding 02/15/2017  . Atrial fibrillation /flutter    hx of flutter ablation 2/12  . Cardiomyopathy    EF55% 11/14<<35%   . CHF (congestive heart failure) ([place])   . COPD (chronic obstructive pulmonary disease) ([place])   . GERD (gastroesophageal reflux disease)   . Gout   . Hepatitis    "think I had that once; a long time ago; from dirty needles I think" (12/14/2012)  . Hypertension   . Leukocytosis, unspecified 06/12/2013  . Myeloproliferative neoplasm ([place]) 08/15/2013  . Obesity   . Pneumonia X 1  . Primary polycythemia ([place]) 06/12/2013  . Renal insufficiency   . Shortness of breath    "all the time lately" (12/14/2012)  . Substance abuse ([place])  alcohol; hx cocaine, heroin, crack use  . Syncope    Social History   Socioeconomic History  . Marital status: Single    Spouse name: Not on file  . Number of children: Not on file  . Years of education: Not on file  . Highest education level: Not on file  Occupational History  . Occupation: Retired  Scientific laboratory technician  . Financial resource strain: Not on file  . Food insecurity:    Worry: Not on file    Inability: Not on file  . Transportation needs:    Medical: Not on file    Non-medical: Not on file  Tobacco Use  . Smoking status: Current Every Day Smoker    Packs/day: 0.10     Years: 50.00    Pack years: 5.00    Types: Cigarettes  . Smokeless tobacco: Never Used  Substance and Sexual Activity  . Alcohol use: Yes    Alcohol/week: 53.4 oz    Types: 12 Cans of beer, 77 Shots of liquor per week    Comment: 02/15/2017 "a pint of vodka/day" plus 12 pack of beer/week"  . Drug use: Yes    Types: "Crack" cocaine, Cocaine, Heroin    Comment: 02/15/2017 "last used crack 4-5 years ago; last used heroin a couple years ago; currently denies any drug use-- on suboxone.   . Sexual activity: Not Currently  Lifestyle  . Physical activity:    Days per week: Not on file    Minutes per session: Not on file  . Stress: Not on file  Relationships  . Social connections:    Talks on phone: Not on file    Gets together: Not on file    Attends religious service: Not on file    Active member of club or organization: Not on file    Attends meetings of clubs or organizations: Not on file    Relationship status: Not on file  Other Topics Concern  . Not on file  Social History Narrative   Moved here from [place]. Lives with common law wife and grandchildren. He is retired from "general labor."   Family History  Problem Relation Age of Onset  . Diabetes Mother   . Hypertension Mother   . Cirrhosis Father   . Alcohol abuse Father   . Colon cancer Neg Hx   . Rectal cancer Neg Hx   . Stomach cancer Neg Hx     ASSESSMENT Recent Results: The most recent result is correlated with 13.75 mg per week: Lab Results  Component Value Date   INR 2.9 12/27/2017   INR 2.20 11/22/2017   INR 2.80 10/11/2017    Anticoagulation Dosing: Description   Take one (1) tablet of your 2.5mg  green-colored warfarin tablets all days of the week--EXCEPT on Mondays, Tuesdays,  Wednesdays and Fridays take only one-half (1/2) tablet. Repeat this regimen weekly.     INR today: Therapeutic  PLAN Weekly dose was decreased by 9% to 12.5 mg per week  Patient Instructions  Patient instructed to take  medications as defined in the Anti-coagulation Track section of this encounter.  Patient instructed to take today's dose.  Patient instructed to take one (1) tablet of your 2.5mg  green-colored warfarin tablets all days of the week--EXCEPT on Mondays, Tuesdays,  Wednesdays and Fridays take ONLY one-half (1/2) tablet on Mondays, Tuesdays, Wednesdays and Fridays.  Patient verbalized understanding of these instructions.     Patient advised to contact clinic or seek medical attention if signs/symptoms of bleeding  or thromboembolism occur.  Patient verbalized understanding by repeating back information and was advised to contact me if further medication-related questions arise. Patient was also provided an information handout.  Follow-up Return in 1 month (on 01/24/2018) for Follow up INR at 0845h.  Pennie Banter, PharmD, CACP, CPP  15 minutes spent face-to-face with the patient during the encounter. 50% of time spent on education. 50% of time was spent on fingerstick point of care INR sample collection, processing, results determination, dose adjustment and documentation in [email or web address].

## 2017-12-27 NOTE — Progress Notes (Signed)
INTERNAL MEDICINE TEACHING ATTENDING ADDENDUM - Mackenzy Grumbine M.D  Duration- indefinite, Indication- afib, INR- therapeutic. Agree with pharmacy recommendations as outlined in their note.     

## 2017-12-27 NOTE — Patient Instructions (Signed)
Patient instructed to take medications as defined in the Anti-coagulation Track section of this encounter.  Patient instructed to take today's dose.  Patient instructed to take one (1) tablet of your 2.5mg  green-colored warfarin tablets all days of the week--EXCEPT on Mondays, Tuesdays,  Wednesdays and Fridays take ONLY one-half (1/2) tablet on Mondays, Tuesdays, Wednesdays and Fridays.  Patient verbalized understanding of these instructions.

## 2017-12-28 MED ORDER — TIOTROPIUM BROMIDE MONOHYDRATE 18 MCG IN CAPS: 18.0000 ug | ORAL_CAPSULE | Freq: Every day | RESPIRATORY_TRACT | 11 refills | Status: DC

## 2018-01-05 ENCOUNTER — Other Ambulatory Visit: Payer: Self-pay | Admitting: Internal Medicine

## 2018-01-05 DIAGNOSIS — D45 Polycythemia vera: Secondary | ICD-10-CM

## 2018-01-17 ENCOUNTER — Emergency Department (HOSPITAL_COMMUNITY)
Admission: EM | Admit: 2018-01-17 | Discharge: 2018-01-18 | Disposition: A | Discharge status: Home / Self Care | Payer: Medicare Other | Attending: Emergency Medicine | Admitting: Emergency Medicine

## 2018-01-17 ENCOUNTER — Emergency Department (HOSPITAL_COMMUNITY): Payer: Medicare Other

## 2018-01-17 ENCOUNTER — Other Ambulatory Visit: Payer: Self-pay

## 2018-01-17 DIAGNOSIS — J449 Chronic obstructive pulmonary disease, unspecified: Secondary | ICD-10-CM | POA: Insufficient documentation

## 2018-01-17 DIAGNOSIS — F17210 Nicotine dependence, cigarettes, uncomplicated: Secondary | ICD-10-CM | POA: Insufficient documentation

## 2018-01-17 DIAGNOSIS — I110 Hypertensive heart disease with heart failure: Secondary | ICD-10-CM | POA: Diagnosis not present

## 2018-01-17 DIAGNOSIS — Z7901 Long term (current) use of anticoagulants: Secondary | ICD-10-CM | POA: Diagnosis not present

## 2018-01-17 DIAGNOSIS — I5030 Unspecified diastolic (congestive) heart failure: Secondary | ICD-10-CM | POA: Diagnosis not present

## 2018-01-17 DIAGNOSIS — M25532 Pain in left wrist: Secondary | ICD-10-CM | POA: Diagnosis not present

## 2018-01-17 DIAGNOSIS — M10432 Other secondary gout, left wrist: Secondary | ICD-10-CM | POA: Insufficient documentation

## 2018-01-17 NOTE — ED Notes (Signed)
Pt placed on monitor, pulse was 35, informed PA who was bedside.  Charge notified. Pt moved to Dpod, placed on cardiac monitor.

## 2018-01-17 NOTE — ED Provider Notes (Signed)
Patient placed in Quick Look pathway, seen and evaluated   Chief Complaint: left wrist pain  HPI:   Pt reports history of gout.  Rn advised pt has low heart rate 30-33.   ROS: sleepy, tired, swollen wrist  Physical Exam:   Gen: No distress  Neuro: Awake and Alert  Skin: Warm    Focused Exam: heart rate 30 by auscultation by me.  Wrist swollen and tender.   Pt moved to monitored bed, ekg ordered.    Initiation of care has begun. The patient has been counseled on the process, plan, and necessity for staying for the completion/evaluation, and the remainder of the medical screening examination   Sidney Ace 01/17/18 2336    Margette Fast, MD 01/18/18 [phone]

## 2018-01-17 NOTE — ED Triage Notes (Addendum)
Patient c/o left wrist pain x1 week; denies injury. Patient is a poor historian.

## 2018-01-18 DIAGNOSIS — M25532 Pain in left wrist: Secondary | ICD-10-CM | POA: Diagnosis not present

## 2018-01-18 LAB — I-STAT CG4 LACTIC ACID, ED
Lactic Acid, Venous: 1.44 mmol/L (ref 0.5–1.9)
Lactic Acid, Venous: 1.96 mmol/L — ABNORMAL HIGH (ref 0.5–1.9)

## 2018-01-18 LAB — CBC WITH DIFFERENTIAL/PLATELET
BASOS PCT: 0 %
Basophils Absolute: 0 10*3/uL (ref 0.0–0.1)
EOS ABS: 0.1 10*3/uL (ref 0.0–0.7)
Eosinophils Relative: 1 %
HCT: 37.2 % — ABNORMAL LOW (ref 39.0–52.0)
HEMOGLOBIN: 13 g/dL (ref 13.0–17.0)
LYMPHS PCT: 17 %
Lymphs Abs: 1.1 10*3/uL (ref 0.7–4.0)
MCH: 44.2 pg — ABNORMAL HIGH (ref 26.0–34.0)
MCHC: 34.9 g/dL (ref 30.0–36.0)
MCV: 126.5 fL — ABNORMAL HIGH (ref 78.0–100.0)
Monocytes Absolute: 0.7 10*3/uL (ref 0.1–1.0)
Monocytes Relative: 11 %
NEUTROS PCT: 71 %
Neutro Abs: 4.4 10*3/uL (ref 1.7–7.7)
PLATELETS: 179 10*3/uL (ref 150–400)
RBC: 2.94 MIL/uL — AB (ref 4.22–5.81)
RDW: 13.1 % (ref 11.5–15.5)
WBC: 6.3 10*3/uL (ref 4.0–10.5)

## 2018-01-18 LAB — BASIC METABOLIC PANEL
ANION GAP: 12 (ref 5–15)
BUN: 23 mg/dL (ref 8–23)
CHLORIDE: 97 mmol/L — AB (ref 98–111)
CO2: 31 mmol/L (ref 22–32)
CREATININE: 1.52 mg/dL — AB (ref 0.61–1.24)
Calcium: 8.2 mg/dL — ABNORMAL LOW (ref 8.9–10.3)
GFR calc non Af Amer: 45 mL/min — ABNORMAL LOW (ref >60)
GFR, EST AFRICAN AMERICAN: 52 mL/min — AB (ref >60)
Glucose, Bld: 96 mg/dL (ref 70–99)
POTASSIUM: 3.1 mmol/L — AB (ref 3.5–5.1)
SODIUM: 140 mmol/L (ref 135–145)

## 2018-01-18 LAB — URIC ACID: Uric Acid, Serum: 13.5 mg/dL — ABNORMAL HIGH (ref 3.7–8.6)

## 2018-01-18 LAB — SEDIMENTATION RATE: Sed Rate: 97 mm/hr — ABNORMAL HIGH (ref 0–16)

## 2018-01-18 MED ORDER — PREDNISONE 20 MG PO TABS
60.0000 mg | ORAL_TABLET | Freq: Once | ORAL | Status: AC
  Administered 2018-01-18: 60 mg via ORAL
  Filled 2018-01-18: qty 3

## 2018-01-18 MED ORDER — MORPHINE SULFATE (PF) 4 MG/ML IV SOLN
4.0000 mg | Freq: Once | INTRAVENOUS | Status: AC
  Administered 2018-01-18: 4 mg via INTRAVENOUS
  Filled 2018-01-18: qty 1

## 2018-01-18 MED ORDER — ONDANSETRON HCL 4 MG/2ML IJ SOLN
4.0000 mg | Freq: Once | INTRAMUSCULAR | Status: AC
  Administered 2018-01-18: 4 mg via INTRAVENOUS
  Filled 2018-01-18: qty 2

## 2018-01-18 MED ORDER — METHYLPREDNISOLONE 4 MG PO TBPK: ORAL_TABLET | ORAL | 0 refills | Status: DC

## 2018-01-18 MED ORDER — OXYCODONE-ACETAMINOPHEN 5-325 MG PO TABS
1.0000 | ORAL_TABLET | Freq: Once | ORAL | Status: AC
  Administered 2018-01-18: 1 via ORAL
  Filled 2018-01-18: qty 1

## 2018-01-18 MED ORDER — POTASSIUM CHLORIDE CRYS ER 20 MEQ PO TBCR
40.0000 meq | EXTENDED_RELEASE_TABLET | Freq: Once | ORAL | Status: AC
  Administered 2018-01-18: 40 meq via ORAL
  Filled 2018-01-18: qty 2

## 2018-01-18 NOTE — Discharge Instructions (Addendum)
You were seen today for left wrist pain.  This is likely related to your gout.  Avoid pork and alcohol products especially beer.  Take medications as prescribed.  Diet modification is very important.  If you develop redness, swelling, any new or worsening symptoms you should be reevaluated immediately.

## 2018-01-18 NOTE — ED Provider Notes (Signed)
[facility] EMERGENCY DEPARTMENT Provider Note   CSN: [id number] Arrival date & time: 01/17/18  2137     History   Chief Complaint Chief Complaint  Patient presents with  . Wrist Pain    HPI [name] is a 72 y.o. male.  HPI  This is a 72 year old male with a history of cardiomyopathy, CHF, COPD, gout, polysubstance abuse who presents with left wrist pain.  Patient reports 1 week history of left wrist pain.  Progressively worsening.  Currently he rates his pain at 10 out of 10.  He took Tylenol with minimal relief.  He denies any fevers.  Pain is worse with range of motion.  He denies any injury.  Denies any weakness, numbness, tingling of the hand.  Of note, patient was noted to have a pulse rate in the 30s in triage.  EKG shows bigeminy.  No history of the same.  She denies any shortness of breath, chest pain, palpitations.  Patient reports she is never had gout in his wrist.  Usually it is in his knee.  He does report drinking some beer this weekend.  Denies IV drug use.  Past Medical History:  Diagnosis Date  . Acute lower GI bleeding 02/15/2017  . Atrial fibrillation /flutter    hx of flutter ablation 2/12  . Cardiomyopathy    EF55% 11/14<<35%   . CHF (congestive heart failure) ([place])   . COPD (chronic obstructive pulmonary disease) ([place])   . GERD (gastroesophageal reflux disease)   . Gout   . Hepatitis    "think I had that once; a long time ago; from dirty needles I think" (12/14/2012)  . Hypertension   . Leukocytosis, unspecified 06/12/2013  . 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 Obesity   . Pneumonia X 1  . Primary polycythemia ([place]) 06/12/2013  . Renal insufficiency   . Shortness of breath    "all the time lately" (12/14/2012)  . Substance abuse ([place])    alcohol; hx cocaine, heroin, crack use  . Syncope     Patient Active Problem List   Diagnosis Date Noted  . GI bleed 02/15/2017  . Pancytopenia, acquired ([place])  11/26/2016  . Tubular adenoma 11/25/2016  . Gout 11/02/2016  . Preventive measure 05/25/2016  . Hepatitis C antibody test positive   . Hematuria 11/09/2014  . Acute kidney injury ([place]) 11/01/2014  . Leukopenia due to antineoplastic chemotherapy ([place]) 08/24/2014  . Thrombocytopenia due to drugs 07/25/2014  . Encounter for screening colonoscopy 07/02/2014  . Chronic anticoagulation 07/02/2014  . Prolonged QT interval 06/05/2014  . Sinus bradycardia 06/05/2014  . Opioid dependence ([place]) 06/05/2014  . Diastolic dysfunction 66/59/9357  . COPD (chronic obstructive pulmonary disease) ([place]) 05/30/2014  . Health care maintenance 05/24/2014  . Anemia due to antineoplastic chemotherapy 04/24/2014  . Thyroid mass 08/26/2013  . Syncope 08/22/2013  . 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 Polycythemia vera ([place]) 01/03/2013  . Malnutrition of moderate degree ([place]) 12/15/2012  . Tobacco abuse 09/15/2012  . Heart failure with preserved left ventricular function (HFpEF) ([place])   . Alcoholism ([place]) 09/30/2010  . Hypertension 09/30/2010  . Atrial fibrillation [facility])     Past Surgical History:  Procedure Laterality Date  . CARDIAC ELECTROPHYSIOLOGY MAPPING AND ABLATION  08/2010   [name] 09/07/2010 (12/14/2012)  . COLONOSCOPY     15-20 years ago had colon in [place]  . EXCISIONAL HEMORRHOIDECTOMY  1970's  . LOOP RECORDER IMPLANT N/A 08/23/2013   Procedure: LOOP RECORDER IMPLANT;  Surgeon: Deboraha Sprang, MD;  Location: [facility] CATH LAB;  Service: Cardiovascular;  Laterality: N/A;  . MULTIPLE EXTRACTIONS WITH ALVEOLOPLASTY Bilateral 01/24/2016   Procedure: MULTIPLE EXTRACTION WITH ALVEOLOPLASTY BILATERAL;  Surgeon: Diona Browner, DDS;  Location: [place];  Service: Oral Surgery;  Laterality: Bilateral;  . MULTIPLE TOOTH EXTRACTIONS  01/24/2016   MULTIPLE EXTRACTION WITH ALVEOLOPLASTY BILATERAL (Bilateral)        Home Medications    Prior to Admission medications   Medication Sig Start Date End Date  Taking? Authorizing Provider  albuterol (PROAIR HFA) 108 (90 Base) MCG/ACT inhaler Inhale 1-2 puffs into the lungs every 6 (six) hours as needed for wheezing or shortness of breath. PLACE ON HOLD UNTIL PATIENT REQUESTS TO FILL 09/27/15   Juluis Mire, MD  amiodarone (PACERONE) 200 MG tablet TAKE 1 TABLET BY MOUTH ONCE DAILY 10/28/17   Jule Ser, DO  carvedilol (COREG) 12.5 MG tablet TAKE 1 TABLET BY MOUTH EVERY MORNING 10/04/17   Jule Ser, DO  fluticasone (FLOVENT HFA) 44 MCG/ACT inhaler Inhale 2 puffs into the lungs 2 (two) times daily.    [provider]  folic acid (FOLVITE) 1 MG tablet Take 1 tablet (1 mg total) by mouth daily. 08/03/17   Jule Ser, DO  furosemide (LASIX) 40 MG tablet Take 1 tablet (40 mg total) by mouth daily. 01/19/17   Jule Ser, DO  hydrocortisone (ANUSOL-HC) 25 MG suppository Place 1 suppository (25 mg total) rectally at bedtime. 02/16/17   Helberg, Larkin Ina, MD  hydroxyurea (HYDREA) 500 MG capsule TAKE 2 CAPSULES BY MOUTH ONCE DAILY*MAY TAKE WITH FOOD TO MINIMIZE GI SIDE EFFECTS) 01/06/18   Jule Ser, DO  methylPREDNISolone (MEDROL DOSEPAK) 4 MG TBPK tablet Take as directed on packet 01/18/18   Pamalee Marcoe, Barbette Hair, MD  SUBOXONE 8-2 MG FILM Place 1 Film under the tongue daily.  08/21/15   [provider]  tiotropium (SPIRIVA HANDIHALER) 18 MCG inhalation capsule Place 1 capsule (18 mcg total) into inhaler and inhale daily. PLACE ON HOLD UNTIL PATIENT REQUESTS TO FILL 12/28/17   Jule Ser, DO  warfarin (COUMADIN) 2.5 MG tablet Take one (1) tablet all days of the week--EXCEPT on Mondays, Wednesdays and Fridays take only one-half (1/2) tablet. 12/09/17   Jule Ser, DO    Family History Family History  Problem Relation Age of Onset  . Diabetes Mother   . Hypertension Mother   . Cirrhosis Father   . Alcohol abuse Father   . Colon cancer Neg Hx   . Rectal cancer Neg Hx   . Stomach cancer Neg Hx     Social History Social  History   Tobacco Use  . Smoking status: Current Every Day Smoker    Packs/day: 0.10    Years: 50.00    Pack years: 5.00    Types: Cigarettes  . Smokeless tobacco: Never Used  Substance Use Topics  . Alcohol use: Yes    Alcohol/week: 53.4 oz    Types: 12 Cans of beer, 77 Shots of liquor per week    Comment: 02/15/2017 "a pint of vodka/day" plus 12 pack of beer/week"  . Drug use: Yes    Types: "Crack" cocaine, Cocaine, Heroin    Comment: 02/15/2017 "last used crack 4-5 years ago; last used heroin a couple years ago; currently denies any drug use-- on suboxone.      Allergies   Patient has no known allergies.   Review of Systems Review of Systems  Constitutional: Negative for fever.  Respiratory: Negative for  shortness of breath.   Cardiovascular: Negative for chest pain and palpitations.  Gastrointestinal: Negative for abdominal pain.  Musculoskeletal:       Left wrist pain  Skin: Negative for color change and wound.  Neurological: Negative for weakness and numbness.  All other systems reviewed and are negative.    Physical Exam Updated Vital Signs BP 139/61   Pulse 63   Temp 98.7 F (37.1 C) (Oral)   Resp 12   Ht 5\' 6"  (1.676 m)   Wt 97.5 kg (215 lb)   SpO2 98%   BMI 34.70 kg/m   Physical Exam  Constitutional: He is oriented to person, place, and time. He appears well-developed and well-nourished.  HENT:  Head: Normocephalic and atraumatic.  Eyes: Pupils are equal, round, and reactive to light.  Neck: Neck supple.  Cardiovascular: Normal rate, regular rhythm and normal heart sounds.  No murmur heard. Pulmonary/Chest: Effort normal and breath sounds normal. No respiratory distress. He has no wheezes.  Abdominal: Soft. There is no tenderness.  Musculoskeletal: He exhibits no edema.  Swelling noted to the left wrist, minimal range of motion, pain with both active and passive range of motion, no overlying skin changes, 2+ radial pulse, neurovascular intact  distally  Neurological: He is alert and oriented to person, place, and time.  Skin: Skin is warm and dry.  Psychiatric: He has a normal mood and affect.  Nursing note and vitals reviewed.    ED Treatments / Results  Labs (all labs ordered are listed, but only abnormal results are displayed) Labs Reviewed  CBC WITH DIFFERENTIAL/PLATELET - Abnormal; Notable for the following components:      Result Value   RBC 2.94 (*)    HCT 37.2 (*)    MCV 126.5 (*)    MCH 44.2 (*)    All other components within normal limits  BASIC METABOLIC PANEL - Abnormal; Notable for the following components:   Potassium 3.1 (*)    Chloride 97 (*)    Creatinine, Ser 1.52 (*)    Calcium 8.2 (*)    GFR calc non Af Amer 45 (*)    GFR calc Af Amer 52 (*)    All other components within normal limits  SEDIMENTATION RATE - Abnormal; Notable for the following components:   Sed Rate 97 (*)    All other components within normal limits  URIC ACID - Abnormal; Notable for the following components:   Uric Acid, Serum 13.5 (*)    All other components within normal limits  I-STAT CG4 LACTIC ACID, ED - Abnormal; Notable for the following components:   Lactic Acid, Venous 1.96 (*)    All other components within normal limits  C-REACTIVE PROTEIN  I-STAT CG4 LACTIC ACID, ED    EKG EKG Interpretation  Date/Time:  Monday January 17 2018 23:29:49 EDT Ventricular Rate:  77 PR Interval:    QRS Duration: 93 QT Interval:  382 QTC Calculation: 349 R Axis:   12 Text Interpretation:  Sinus rhythm Supraventricular bigeminy Abnormal R-wave progression, early transition Borderline repolarization abnormality Confirmed by Thayer Jew [phone]) on 01/17/2018 11:45:31 PM   Radiology Dg Wrist Complete Left  Result Date: 01/17/2018 CLINICAL DATA:  72 year old male with left wrist pain and swelling. No known injury. EXAM: LEFT WRIST - COMPLETE 3+ VIEW COMPARISON:  Left wrist radiograph dated 09/30/2017 FINDINGS: There is no acute  fracture or dislocation. The bones are osteopenic. No significant arthritic changes. There is diffuse soft tissue swelling of the forearm and  wrist. No radiopaque foreign object or soft tissue gas. Vascular calcification noted. IMPRESSION: 1. No acute fracture or dislocation. 2. Diffuse soft tissue swelling of the wrist may represent cellulitis. Clinical correlation is recommended. Electronically Signed   By: Anner Crete M.D.   On: 01/17/2018 22:16    Procedures Procedures (including critical care time)  Medications Ordered in ED Medications  oxyCODONE-acetaminophen (PERCOCET/ROXICET) 5-325 MG per tablet 1 tablet (has no administration in time range)  morphine 4 MG/ML injection 4 mg (4 mg Intravenous Given 01/18/18 0029)  ondansetron (ZOFRAN) injection 4 mg (4 mg Intravenous Given 01/18/18 0028)  predniSONE (DELTASONE) tablet 60 mg (60 mg Oral Given 01/18/18 0149)  potassium chloride SA (K-DUR,KLOR-CON) CR tablet 40 mEq (40 mEq Oral Given 01/18/18 0150)     Initial Impression / Assessment and Plan / ED Course  I have reviewed the triage vital signs and the nursing notes.  Pertinent labs & imaging results that were available during my care of the patient were reviewed by me and considered in my medical decision making (see chart for details).     Patient presents with left wrist pain.  Atraumatic.  He is overall nontoxic-appearing.  Initially noted to have a heart rate in the 30s.  He is in bigeminy.  He is currently with heart rate in the 60s.  He is otherwise asymptomatic.  He is afebrile.  Denies any infectious symptoms.  He does have an acutely swollen and tender left wrist.  He has a history of gout and reports recent significant alcohol intake.  Patient was given pain medication.  Considerations include gouty flare, inflammatory arthritis, septic arthritis although in general he is very well-appearing.  X-rays show no evidence of fracture; however, concern for possible cellulitis.   Clinically there is no evidence of cellulitis on exam.  Patient has no significant leukocytosis.  Uric acid level is 13.5.  He has mild hypokalemia.  Creatinine is 1.52.  For this reason, would not be a good candidate for anti-inflammatories.  He was given prednisone.  Additionally he is on Suboxone for maintenance therapy.  Opioids are also not a good option.  Sed rate is elevated; however, given absence of leukocytosis or fever, would have much lower suspicion for septic arthritis.  Given history, would suspect gout and sed rate may be reflective of acute inflammation.  I discussed this with the patient.  Supportive measures and a Medrol Dosepak for pain.  Diet modification.  He was given strict return precautions if he develops fevers or worsening pain or swelling.  Patient stated understanding.  After history, exam, and medical workup I feel the patient has been appropriately medically screened and is safe for discharge home. Pertinent diagnoses were discussed with the patient. Patient was given return precautions.   Final Clinical Impressions(s) / ED Diagnoses   Final diagnoses:  Other secondary acute gout of left wrist    ED Discharge Orders        Ordered    methylPREDNISolone (MEDROL DOSEPAK) 4 MG TBPK tablet     01/18/18 0224       Merryl Hacker, MD 01/18/18 [phone]

## 2018-01-24 ENCOUNTER — Other Ambulatory Visit: Payer: Self-pay

## 2018-01-24 ENCOUNTER — Ambulatory Visit (INDEPENDENT_AMBULATORY_CARE_PROVIDER_SITE_OTHER): Payer: Medicare Other | Admitting: Pharmacist

## 2018-01-24 ENCOUNTER — Ambulatory Visit (INDEPENDENT_AMBULATORY_CARE_PROVIDER_SITE_OTHER): Payer: Medicare Other | Admitting: Internal Medicine

## 2018-01-24 VITALS — BP 112/51 | HR 43 | Temp 98.6°F | Ht 63.0 in | Wt 206.8 lb

## 2018-01-24 DIAGNOSIS — I4891 Unspecified atrial fibrillation: Secondary | ICD-10-CM

## 2018-01-24 DIAGNOSIS — M109 Gout, unspecified: Secondary | ICD-10-CM

## 2018-01-24 DIAGNOSIS — F17210 Nicotine dependence, cigarettes, uncomplicated: Secondary | ICD-10-CM

## 2018-01-24 DIAGNOSIS — I5030 Unspecified diastolic (congestive) heart failure: Secondary | ICD-10-CM

## 2018-01-24 DIAGNOSIS — Z7901 Long term (current) use of anticoagulants: Secondary | ICD-10-CM

## 2018-01-24 DIAGNOSIS — I110 Hypertensive heart disease with heart failure: Secondary | ICD-10-CM | POA: Diagnosis not present

## 2018-01-24 DIAGNOSIS — F1020 Alcohol dependence, uncomplicated: Secondary | ICD-10-CM

## 2018-01-24 DIAGNOSIS — Z79899 Other long term (current) drug therapy: Secondary | ICD-10-CM

## 2018-01-24 DIAGNOSIS — J449 Chronic obstructive pulmonary disease, unspecified: Secondary | ICD-10-CM | POA: Diagnosis not present

## 2018-01-24 DIAGNOSIS — F1021 Alcohol dependence, in remission: Secondary | ICD-10-CM | POA: Diagnosis not present

## 2018-01-24 DIAGNOSIS — Z720 Tobacco use: Secondary | ICD-10-CM

## 2018-01-24 DIAGNOSIS — R001 Bradycardia, unspecified: Secondary | ICD-10-CM | POA: Diagnosis not present

## 2018-01-24 DIAGNOSIS — C946 Myelodysplastic disease, not classified: Secondary | ICD-10-CM | POA: Diagnosis not present

## 2018-01-24 DIAGNOSIS — I10 Essential (primary) hypertension: Secondary | ICD-10-CM

## 2018-01-24 LAB — POCT INR: INR: 1.6 — AB (ref 2.0–3.0)

## 2018-01-24 MED ORDER — UMECLIDINIUM BROMIDE 62.5 MCG/INH IN AEPB: 1.0000 | INHALATION_SPRAY | Freq: Every day | RESPIRATORY_TRACT | 5 refills | Status: DC

## 2018-01-24 MED ORDER — FUROSEMIDE 20 MG PO TABS: 20.0000 mg | ORAL_TABLET | Freq: Every day | ORAL | 5 refills | Status: DC

## 2018-01-24 MED ORDER — CARVEDILOL 6.25 MG PO TABS: 6.2500 mg | ORAL_TABLET | Freq: Every day | ORAL | 5 refills | Status: DC

## 2018-01-24 NOTE — Patient Instructions (Signed)
Patient instructed to take medications as defined in the Anti-coagulation Track section of this encounter.  Patient instructed to take today's dose.  Patient instructed to take one (1) tablet of your 2.5mg  green-colored warfarin tablets all days of the week--EXCEPT on Sundays, Tuesdays, and Thursdays, take only one-half (1/2) tablet. Repeat this regimen weekly. Patient verbalized understanding of these instructions.

## 2018-01-24 NOTE — Assessment & Plan Note (Signed)
Hypotensive 97/56, on recheck112/51. Denies dizziness or fatigue. K+ 3.1  Plan: - decrease coreg to 6.25 - decrease lasix to 20mg   - BMP

## 2018-01-24 NOTE — Assessment & Plan Note (Signed)
Irregular and brady today. Denies fatigue, dizziness, light headedness. Currently taking coreg 12.5 daily  Plan: - decrease coreg to 6.25 daily

## 2018-01-24 NOTE — Assessment & Plan Note (Signed)
Patient he has quit drinking since doctors told him alcohol was bad for his gout

## 2018-01-24 NOTE — Addendum Note (Signed)
Addended by: [name] on: 01/24/2018 12:22 PM   Modules accepted: Level of Service

## 2018-01-24 NOTE — Assessment & Plan Note (Addendum)
05/2014 PFTs: FEV1/FVC 0.58, FVC 2.27 (58%), FEV1 1.31 (45%), DLCOunc 16.84 (62%). GOLD stage 3. Currently, endorses DOE with wheezing. No cough. Currently uses an inhaler prn, patient not sure what the name of the inhaler is. Has been prescribed albuterol, spiriva and fluticasone in the past. PPSV23 and PCV 13 given at ages 80 and 83, respectively. Hospitalized at least 3 times for COPD exacerbations, most recent was 09/2015.   Plan: - start umeclidin daily (insurance preferred over spiriva) - continue albuterol prn  - consider adding inhlaed ICS if needed - counseled on smoking cessation, down to 1-2 cigarettes/day

## 2018-01-24 NOTE — Progress Notes (Signed)
Anticoagulation Management [name] is a 72 y.o. male who reports to the clinic for monitoring of warfarin treatment.    Indication: atrial fibrillation; chronic anticoagulation.    Duration: indefinite Supervising physician: Aldine Contes  Anticoagulation Clinic Visit History: Patient does not report signs/symptoms of bleeding or thromboembolism  Other recent changes: No diet, medications, lifestyle changes endorsed.  Anticoagulation Episode Summary    Current INR goal:   2.0-3.0  TTR:   68.6 % (5.2 y)  Next INR check:   02/21/2018  INR from last check:   1.6! (01/24/2018)  Weekly max warfarin dose:     Target end date:   Indefinite  INR check location:   Anticoagulation Clinic  Preferred lab:     Send INR reminders to:      Indications   Atrial fibrillation ([place]) [I48.91]       Comments:           No Known Allergies Prior to Admission medications   Medication Sig Start Date End Date Taking? Authorizing Provider  albuterol (PROAIR HFA) 108 (90 Base) MCG/ACT inhaler Inhale 1-2 puffs into the lungs every 6 (six) hours as needed for wheezing or shortness of breath. PLACE ON HOLD UNTIL PATIENT REQUESTS TO FILL 09/27/15  Yes Rabbani, Ricarda Frame, MD  amiodarone (PACERONE) 200 MG tablet TAKE 1 TABLET BY MOUTH ONCE DAILY 10/28/17  Yes Jule Ser, DO  carvedilol (COREG) 6.25 MG tablet Take 1 tablet (6.25 mg total) by mouth daily with breakfast. 01/24/18 07/23/18 Yes [name], MD  fluticasone (FLOVENT HFA) 44 MCG/ACT inhaler Inhale 2 puffs into the lungs 2 (two) times daily.   Yes [provider]  folic acid (FOLVITE) 1 MG tablet Take 1 tablet (1 mg total) by mouth daily. 08/03/17  Yes Jule Ser, DO  furosemide (LASIX) 20 MG tablet Take 1 tablet (20 mg total) by mouth daily. 01/24/18 07/23/18 Yes [name], MD  hydrocortisone (ANUSOL-HC) 25 MG suppository Place 1 suppository (25 mg total) rectally at bedtime. 02/16/17  Yes Helberg, Larkin Ina, MD  hydroxyurea  (HYDREA) 500 MG capsule TAKE 2 CAPSULES BY MOUTH ONCE DAILY*MAY TAKE WITH FOOD TO MINIMIZE GI SIDE EFFECTS) 01/06/18  Yes Jule Ser, DO  methylPREDNISolone (MEDROL DOSEPAK) 4 MG TBPK tablet Take as directed on packet 01/18/18  Yes Horton, Barbette Hair, MD  SUBOXONE 8-2 MG FILM Place 1 Film under the tongue daily.  08/21/15  Yes [provider]  umeclidinium bromide (INCRUSE ELLIPTA) 62.5 MCG/INH AEPB Inhale 1 puff into the lungs daily. 01/24/18  Yes [name], MD  warfarin (COUMADIN) 2.5 MG tablet Take one (1) tablet all days of the week--EXCEPT on Mondays, Wednesdays and Fridays take only one-half (1/2) tablet. 12/09/17  Yes Jule Ser, DO   Past Medical History:  Diagnosis Date  . Acute lower GI bleeding 02/15/2017  . Atrial fibrillation /flutter    hx of flutter ablation 2/12  . Cardiomyopathy    EF55% 11/14<<35%   . CHF (congestive heart failure) ([place])   . COPD (chronic obstructive pulmonary disease) ([place])   . GERD (gastroesophageal reflux disease)   . Gout   . Hepatitis    "think I had that once; a long time ago; from dirty needles I think" (12/14/2012)  . Hypertension   . Leukocytosis, unspecified 06/12/2013  . 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 Obesity   . Pneumonia X 1  . Primary polycythemia ([place]) 06/12/2013  . Renal insufficiency   . Shortness of breath    "  all the time lately" (12/14/2012)  . Substance abuse ([place])    alcohol; hx cocaine, heroin, crack use  . Syncope    Social History   Socioeconomic History  . Marital status: Single    Spouse name: Not on file  . Number of children: Not on file  . Years of education: Not on file  . Highest education level: Not on file  Occupational History  . Occupation: Retired  Scientific laboratory technician  . Financial resource strain: Not on file  . Food insecurity:    Worry: Not on file    Inability: Not on file  . Transportation needs:    Medical: Not on file    Non-medical: Not on file  Tobacco Use  .  Smoking status: Current Every Day Smoker    Packs/day: 0.10    Years: 50.00    Pack years: 5.00    Types: Cigarettes  . Smokeless tobacco: Never Used  Substance and Sexual Activity  . Alcohol use: Yes    Alcohol/week: 53.4 oz    Types: 12 Cans of beer, 77 Shots of liquor per week    Comment: 02/15/2017 "a pint of vodka/day" plus 12 pack of beer/week"  . Drug use: Yes    Types: "Crack" cocaine, Cocaine, Heroin    Comment: 02/15/2017 "last used crack 4-5 years ago; last used heroin a couple years ago; currently denies any drug use-- on suboxone.   . Sexual activity: Not Currently  Lifestyle  . Physical activity:    Days per week: Not on file    Minutes per session: Not on file  . Stress: Not on file  Relationships  . Social connections:    Talks on phone: Not on file    Gets together: Not on file    Attends religious service: Not on file    Active member of club or organization: Not on file    Attends meetings of clubs or organizations: Not on file    Relationship status: Not on file  Other Topics Concern  . Not on file  Social History Narrative   Moved here from [place]. Lives with common law wife and grandchildren. He is retired from "general labor."   Family History  Problem Relation Age of Onset  . Diabetes Mother   . Hypertension Mother   . Cirrhosis Father   . Alcohol abuse Father   . Colon cancer Neg Hx   . Rectal cancer Neg Hx   . Stomach cancer Neg Hx     ASSESSMENT Recent Results: The most recent result is correlated with 12.5 mg per week: Lab Results  Component Value Date   INR 1.6 (A) 01/24/2018   INR 2.9 12/27/2017   INR 2.20 11/22/2017    Anticoagulation Dosing: Description   Take one (1) tablet of your 2.5mg  green-colored warfarin tablets all days of the week--EXCEPT on Sundays, Tuesdays, and Thursdays, take only one-half (1/2) tablet. Repeat this regimen weekly.     INR today: Subtherapeutic  PLAN Weekly dose was increased by 10% to 13.75 mg  per week  Patient Instructions  Patient instructed to take medications as defined in the Anti-coagulation Track section of this encounter.  Patient instructed to take today's dose.  Patient instructed to take one (1) tablet of your 2.5mg  green-colored warfarin tablets all days of the week--EXCEPT on Sundays, Tuesdays, and Thursdays, take only one-half (1/2) tablet. Repeat this regimen weekly. Patient verbalized understanding of these instructions.     Patient advised to contact clinic or  seek medical attention if signs/symptoms of bleeding or thromboembolism occur.  Patient verbalized understanding by repeating back information and was advised to contact me if further medication-related questions arise. Patient was also provided an information handout.  Follow-up Return in 1 month (on 02/21/2018) for Follow up INR at 0900h.  Pennie Banter, PharmD, CACP, CPP  15 minutes spent face-to-face with the patient during the encounter. 50% of time spent on education. 50% of time was spent on fingerstick point of care INR sample collection, processing, results determination, dose adjustment and documentation in [email or web address].

## 2018-01-24 NOTE — Assessment & Plan Note (Signed)
CHADS VASC score 3. Today, he is Irregular, brady to 40bpm, and hypotensive. EKG from ER last week showed bigeminy. Patient denies fatigue or dizziness.   Plan:  - Decrease coreg from 12.5 to 6.25 daily - Decrease lasix from 40mg  daily to 20mg  daily  - recheck K+ today - f/u in 2 weeks

## 2018-01-24 NOTE — Assessment & Plan Note (Signed)
Counseling given. Attempting to quit. Down to 1-2 cigarettes a day

## 2018-01-24 NOTE — Assessment & Plan Note (Signed)
Follows in coumadin clinic. INR today 1.6. Will defer management to coumadin clinic

## 2018-01-24 NOTE — Progress Notes (Signed)
INTERNAL MEDICINE TEACHING ATTENDING ADDENDUM - Iyari Hagner M.D  Duration- indefinite, Indication- afib, INR- sub therapeutic. Agree with pharmacy recommendations as outlined in their note.      

## 2018-01-24 NOTE — Assessment & Plan Note (Signed)
Last echo 61/2244 with g1 diastolic dysfunction, EF 97-53%. Currently taking Lasix 40mg  daily. Denies LEE, orthopnea, or PND. Does endorse stable DOE and wheezing. Bradycardic and hypotensive. Euvolemic on exam with recent labs showing elevated creatine and decreased K+  Plan: - decrease lasix to 20mg  daily  - BMP today

## 2018-01-24 NOTE — Progress Notes (Signed)
   CC: health maintenance, medication refill  HPI:  [name] is a 72 y.o. male with h/o a. Fib, copd, chf g1 diastolic dysfunction, myeloproliferative neoplasm, alcohol and tobacco use, gout who presents for health maintenance and refill of lasix.   Please see problem based charting for full details of HPI.    Past Medical History:  Diagnosis Date  . Acute lower GI bleeding 02/15/2017  . Atrial fibrillation /flutter    hx of flutter ablation 2/12  . Cardiomyopathy    EF55% 11/14<<35%   . CHF (congestive heart failure) ([place])   . COPD (chronic obstructive pulmonary disease) ([place])   . GERD (gastroesophageal reflux disease)   . Gout   . Hepatitis    "think I had that once; a long time ago; from dirty needles I think" (12/14/2012)  . Hypertension   . Leukocytosis, unspecified 06/12/2013  . 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 Obesity   . Pneumonia X 1  . Primary polycythemia ([place]) 06/12/2013  . Renal insufficiency   . Shortness of breath    "all the time lately" (12/14/2012)  . Substance abuse ([place])    alcohol; hx cocaine, heroin, crack use  . Syncope    Review of Systems:   General: Denies fever, chills, fatigue Respiratory: +DOE and wheezing. Denies SOB, cough,chest tightness Cardiovascular: Denies chest pain and palpitations.  Gastrointestinal: Denies nausea, vomiting, abdominal pain, blood in stool and abdominal distention.  Musculoskeletal: +joint swelling left wrist Denies myalgias, back pain, arthralgias and gait problem.  Neurological: Denies dizziness, headaches, weakness, lightheadedness  Physical Exam:  Vitals:   01/24/18 0831 01/24/18 0920  BP: (!) 97/56 (!) 112/51  Pulse: 76 (!) 43  Temp: 98.6 F (37 C)   TempSrc: Oral   SpO2: 99%   Weight: 206 lb 12.8 oz (93.8 kg)   Height: 5\' 3"  (1.6 m)    General: Vital signs reviewed.  Patient is well-developed and well-nourished, in no acute distress and cooperative with exam.  Head:  Normocephalic and atraumatic. Eyes: EOMI, conjunctivae normal, no scleral icterus.  Cardiovascular: irregular and bradycardic, S1 normal, S2 normal, no murmurs, gallops, or rubs. Pulmonary/Chest: mild wheezing in bilateral lung bases Abdominal: Soft, non-tender, non-distended, BS +.  Musculoskeletal: slight swelling of left wrist, ROM full and nontender. Extremities: No lower extremity edema bilaterally,  pulses symmetric and intact bilaterally. No cyanosis or clubbing. Skin: Warm, dry and intact. No rashes or erythema. Psychiatric: Normal mood and affect. speech and behavior is normal. Cognition and memory are normal.    Assessment & Plan:   See Encounters Tab for problem based charting.  Patient seen with Dr. Daryll Drown

## 2018-01-24 NOTE — Patient Instructions (Addendum)
It was nice seeing you today. Thank you for choosing [facility] for your Primary Care.   Today we talked about:  1) low blood pressure and low heart rate: I am going to decrease the dose of your carvedilol and lasix. I will also check your potassium today and may call you if it comes back low.  2) Shortness of breath: I sent an inhaler (Umeclidin) to your pharmacy. Please ask the pharmacist how to use this inhaler and use it every day to prevent shortness of breath and wheezing.    FOLLOW-UP INSTRUCTIONS When: 2 weeks For: lab and blood pressure recheck  What to bring: all medications  Please contact the clinic if you have any problems, or need to be seen sooner.    Tiotropium inhalation powder What is this medicine? TIOTROPIUM (tee oh TRO pee um) is a bronchodilator. It helps open up the airways in your lungs to make it easier to breathe. This medicine is used to treat chronic obstructive pulmonary disease (COPD), including emphysema and chronic bronchitis. Do not use this medicine for an acute attack. This medicine may be used for other purposes; ask your health care provider or pharmacist if you have questions. COMMON BRAND NAME(S): Spiriva HandiHaler What should I tell my health care provider before I take this medicine? They need to know if you have any of these conditions: -bladder problems or difficulty passing urine -glaucoma -kidney disease -prostate trouble -an unusual or allergic reaction to tiotropium, ipratropium, atropine, other medicines, lactose, foods, dyes, or preservatives -pregnant or trying to get pregnant -breast-feeding How should I use this medicine? This medicine is used in a special inhaler. Do NOT swallow the capsules. Do NOT use a spacer device. Follow the directions on the prescription label. Take your medicine at regular intervals. Do not take it more often than directed. Do not stop taking except on your doctor's advice. Make sure that you are  using your inhaler correctly. Ask your doctor or health care provider if you have any questions. Small pieces of the capsule may get in your mouth or throat when you breathe in your medicine. This is normal and should not hurt you. Talk to your pediatrician regarding the use of this medicine in children. Special care may be needed. Overdosage: If you think you have taken too much of this medicine contact a poison control center or emergency room at once. NOTE: This medicine is only for you. Do not share this medicine with others. What if I miss a dose? If you miss a dose, use it as soon as you can. If it is almost time for your next dose, use only that dose and continue with your regular schedule. Do not use double or extra doses. What may interact with this medicine? This medicine may also interact with the following medications: -atropine -antihistamines for allergy, cough and cold -certain medicines for bladder problems like oxybutynin, tolterodine -certain medicines for stomach problems like dicyclomine, hyoscyamine -certain medicines for travel sickness like scopolamine -certain medicines for Parkinson's disease like benztropine, trihexyphenidyl -ipratropium This list may not describe all possible interactions. Give your health care provider a list of all the medicines, herbs, non-prescription drugs, or dietary supplements you use. Also tell them if you smoke, drink alcohol, or use illegal drugs. Some items may interact with your medicine. What should I watch for while using this medicine? Visit your doctor or health care professional for regular checks on your progress. Tell your doctor if your symptoms do  not improve. Do not use more medicine than directed. If your symptoms get worse while you are using this medicine, call your doctor right away. Do not get the this medicine in your eyes. It can cause irritation, pain, or blurred vision. You may get dizzy. Do not drive, use machinery, or do  anything that needs mental alertness until you know how this medicine affects you. Do not stand or sit up quickly, especially if you are an older patient. This reduces the risk of dizzy or fainting spells. Clean the inhaler as directed in the patient information sheet that comes with this medicine. What side effects may I notice from receiving this medicine? Side effects that you should report to your doctor or health care professional as soon as possible: -allergic reactions like skin rash or hives, swelling of the face, lips, or tongue -breathing problems -changes in vision -chest pain -fast heartbeat -infection or flu-like symptoms -trouble passing urine or change in the amount of urine Side effects that usually do not require medical attention (report to your doctor or health care professional if they continue or are bothersome): -constipation -cough -dizziness -dry mouth -headache -muscle pain -sore throat -stomach upset This list may not describe all possible side effects. Call your doctor for medical advice about side effects. You may report side effects to FDA at 1-800-FDA-1088. Where should I keep my medicine? Keep out of the reach of children. Store at room temperature between 15 and 30 degrees C (59 and 86 degrees F). Protect from humidity. Keep capsules in the foil pack until you are ready to use. Throw away any unused medicine after the expiration date. NOTE: This sheet is a summary. It may not cover all possible information. If you have questions about this medicine, talk to your doctor, pharmacist, or health care provider.  2018 Elsevier/Gold Standard (2014-04-18 13:19:07)

## 2018-01-24 NOTE — Assessment & Plan Note (Signed)
Chronic use of Amiodarone 200mg  daily. No major side effects  Plan: - continue amiodarone 200 mg daily  - TSH, AST, ALT today  - yearly CXR: March 2019 unconcerning - EKG 01/2018 [place]

## 2018-01-25 LAB — CMP14 + ANION GAP
A/G RATIO: 1 — AB (ref 1.2–2.2)
ALT: 8 IU/L (ref 0–44)
AST: 12 IU/L (ref 0–40)
Albumin: 3.3 g/dL — ABNORMAL LOW (ref 3.5–4.8)
Alkaline Phosphatase: 106 IU/L (ref 39–117)
Anion Gap: 16 mmol/L (ref 10.0–18.0)
BUN/Creatinine Ratio: 22 (ref 10–24)
BUN: 31 mg/dL — AB (ref 8–27)
Bilirubin Total: <0.2 mg/dL (ref 0.0–1.2)
CALCIUM: 8.3 mg/dL — AB (ref 8.6–10.2)
CO2: 31 mmol/L — ABNORMAL HIGH (ref 20–29)
Chloride: 99 mmol/L (ref 96–106)
Creatinine, Ser: 1.41 mg/dL — ABNORMAL HIGH (ref 0.76–1.27)
GFR, EST AFRICAN AMERICAN: 58 mL/min/{1.73_m2} — AB (ref >59)
GFR, EST NON AFRICAN AMERICAN: 50 mL/min/{1.73_m2} — AB (ref >59)
GLOBULIN, TOTAL: 3.2 g/dL (ref 1.5–4.5)
Glucose: 122 mg/dL — ABNORMAL HIGH (ref 65–99)
POTASSIUM: 3.5 mmol/L (ref 3.5–5.2)
SODIUM: 146 mmol/L — AB (ref 134–144)
TOTAL PROTEIN: 6.5 g/dL (ref 6.0–8.5)

## 2018-01-25 LAB — TSH: TSH: 2.84 u[IU]/mL (ref 0.450–4.500)

## 2018-01-28 NOTE — Progress Notes (Signed)
Internal Medicine Clinic Attending  I saw and evaluated the patient.  I personally confirmed the key portions of the history and exam documented by Dr. Vogel and I reviewed pertinent patient test results.  The assessment, diagnosis, and plan were formulated together and I agree with the documentation in the resident's note.  

## 2018-02-07 ENCOUNTER — Other Ambulatory Visit: Payer: Self-pay

## 2018-02-07 ENCOUNTER — Ambulatory Visit (INDEPENDENT_AMBULATORY_CARE_PROVIDER_SITE_OTHER): Payer: Medicare Other | Admitting: Internal Medicine

## 2018-02-07 ENCOUNTER — Ambulatory Visit: Payer: Medicare Other

## 2018-02-07 VITALS — BP 97/59 | HR 56 | Temp 98.2°F | Ht 63.0 in | Wt 207.9 lb

## 2018-02-07 DIAGNOSIS — I4891 Unspecified atrial fibrillation: Secondary | ICD-10-CM

## 2018-02-07 DIAGNOSIS — F1911 Other psychoactive substance abuse, in remission: Secondary | ICD-10-CM | POA: Diagnosis not present

## 2018-02-07 DIAGNOSIS — J449 Chronic obstructive pulmonary disease, unspecified: Secondary | ICD-10-CM | POA: Diagnosis not present

## 2018-02-07 DIAGNOSIS — Z79899 Other long term (current) drug therapy: Secondary | ICD-10-CM

## 2018-02-07 DIAGNOSIS — F17210 Nicotine dependence, cigarettes, uncomplicated: Secondary | ICD-10-CM | POA: Diagnosis not present

## 2018-02-07 DIAGNOSIS — M109 Gout, unspecified: Secondary | ICD-10-CM | POA: Diagnosis not present

## 2018-02-07 DIAGNOSIS — I482 Chronic atrial fibrillation: Secondary | ICD-10-CM | POA: Diagnosis not present

## 2018-02-07 DIAGNOSIS — I5032 Chronic diastolic (congestive) heart failure: Secondary | ICD-10-CM | POA: Diagnosis not present

## 2018-02-07 DIAGNOSIS — I5030 Unspecified diastolic (congestive) heart failure: Secondary | ICD-10-CM

## 2018-02-07 MED ORDER — CARVEDILOL 6.25 MG PO TABS: 3.1250 mg | ORAL_TABLET | Freq: Every day | ORAL | 2 refills | Status: DC

## 2018-02-07 NOTE — Progress Notes (Signed)
   CC: a. fib  HPI:  [name] is a 72 y.o. male with a.fib, CHF, copd, gout, h/o substance abuse on suboxone who presents for follow up of a. Fib. He is doing well with no complaints today and has brought all medications with him. Reports compliance with decreased doses of lasix and coreg after last visit. Denies chest pain, palpitations, dizziness, lower extremity edema, or worsening sob.    Past Medical History:  Diagnosis Date  . Acute lower GI bleeding 02/15/2017  . Atrial fibrillation /flutter    hx of flutter ablation 2/12  . Cardiomyopathy    EF55% 11/14<<35%   . CHF (congestive heart failure) ([place])   . COPD (chronic obstructive pulmonary disease) ([place])   . GERD (gastroesophageal reflux disease)   . Gout   . Hepatitis    "think I had that once; a long time ago; from dirty needles I think" (12/14/2012)  . Hypertension   . Leukocytosis, unspecified 06/12/2013  . 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 Obesity   . Pneumonia X 1  . Primary polycythemia ([place]) 06/12/2013  . Renal insufficiency   . Shortness of breath    "all the time lately" (12/14/2012)  . Substance abuse ([place])    alcohol; hx cocaine, heroin, crack use  . Syncope     Physical Exam:  Vitals:   02/07/18 0836  BP: (!) 97/59  Pulse: (!) 56  Temp: 98.2 F (36.8 C)  TempSrc: Oral  SpO2: 98%  Weight: 207 lb 14.4 oz (94.3 kg)  Height: 5\' 3"  (1.6 m)   Gen: Well appearing, NAD CV: in and out of a.fib, bradycardic, no murmurs Pulm: Normal effort, CTA throughout, no wheezing Ext: Warm, no edema     Assessment & Plan:   See Encounters Tab for problem based charting.  Patient seen with Dr. Rebeca Alert

## 2018-02-07 NOTE — Assessment & Plan Note (Addendum)
Chronic. Stable. Reports compliance with decreased lasix dose. Continues to be hypotensive today, although asymptomatic. Denies chest pain, lower extremity edema, or worsening sob. Euvolemic on exam. Last echo in 4037 with g1 diastolic dysfunction and preserved EF but has h/o reduced EF 40% in 2014.  Current medication regimen includes lasix 20mg  daily, coreg 6.25mg  daily.  Per chart review, patient was on lisinopril 5mg  but discontinued in 2015 due to syncope and AKI    Plan: - Cardiology referral: reevaluate his need for lasix and any other medication recs.  - Decrease coreg to 3.125mg  due to bradycardia - Continue lasix 20mg  for now but question the need for this dose in the future. Could consider every other day dosing with weight monitoring but patient does not have a scale at home.

## 2018-02-07 NOTE — Assessment & Plan Note (Signed)
Chronic. Continues to be Irregular, bradycardic after halving Coreg dose last visit. Continues Amiodarone 200mg . Reassuring that patient is asymptomatic. Last saw cardiology in 2017.   Plan: - decrease coreg from 6.25 to 3.125mg  daily - continue amiodarone 200mg  daily - cardiology referral to reevaluate amiodarone necessity, as well as any other medication recs.

## 2018-02-07 NOTE — Patient Instructions (Signed)
It was nice seeing you today. Thank you for choosing [facility] for your Primary Care.   Today we talked about:  1) Heart rate: Your heart rate continues to be lower than we would like it. I am decreasing your Coreg (carvedilol) to 3.125mg  once a day. I don't think they make tablets this small so you will have to cut the 6.25mg  tablets in half and take a half tab once a day.  I have placed a referral to cardiology. You have seen Dr. Cristopher Peru in the past. Hopefully you can see him again.   Thank you for bringing your medications today! That was very helpful.   FOLLOW-UP INSTRUCTIONS When: 1-2 months For: a. Fib, blood pressure What to bring: all medications   Please contact the clinic if you have any problems, or need to be seen sooner.

## 2018-02-08 NOTE — Progress Notes (Signed)
Internal Medicine Clinic Attending  I saw and evaluated the patient.  I personally confirmed the key portions of the history and exam documented by Dr. Donne Hazel and I reviewed pertinent patient test results.  The assessment, diagnosis, and plan were formulated together and I agree with the documentation in the resident's note.  72 yo M with HFrEF with improved LVEF on medical therapy. Although BP is a little on the low side, not truly hypotensive and no symptoms, reports feeling "great". HR still <60, will decrease carvedilol again to 3.125 mg. Will want to continue to reevaluate best medication regimen for him, would benefit from cardiology input as well, but has not seen them in a few years. In particular, would be helpful to see if we could get him off amiodarone, but may require ablation. May not need lasix, but may be helpful to try to restart low dose ACE-I if he can tolerate. Again, will continue to adjust regimen as able.   Lenice Pressman, M.D., Ph.D.

## 2018-02-21 ENCOUNTER — Ambulatory Visit (INDEPENDENT_AMBULATORY_CARE_PROVIDER_SITE_OTHER): Payer: Medicare Other | Admitting: Pharmacist

## 2018-02-21 DIAGNOSIS — Z5181 Encounter for therapeutic drug level monitoring: Secondary | ICD-10-CM | POA: Diagnosis not present

## 2018-02-21 DIAGNOSIS — Z7901 Long term (current) use of anticoagulants: Secondary | ICD-10-CM

## 2018-02-21 DIAGNOSIS — I4891 Unspecified atrial fibrillation: Secondary | ICD-10-CM | POA: Diagnosis present

## 2018-02-21 LAB — POCT INR: INR: 2.8 (ref 2.0–3.0)

## 2018-02-21 NOTE — Patient Instructions (Signed)
Patient educated about medication as defined in this encounter and verbalized understanding by repeating back instructions provided.   

## 2018-02-21 NOTE — Progress Notes (Signed)
Anticoagulation Management [name] is a 72 y.o. male who reports to the clinic for monitoring of warfarin treatment.    Indication: atrial fibrillation; chronic anticoagulation.    Duration: indefinite Supervising physician: Aldine Contes  Anticoagulation Clinic Visit History: Patient does not report signs/symptoms of bleeding or thromboembolism  Other recent changes: No diet, medications, lifestyle changes endorsed.   Anticoagulation Episode Summary    Current INR goal:   2.0-3.0  TTR:   68.6 % (5.3 y)  Next INR check:   03/21/2018  INR from last check:   2.8 (02/21/2018)  Weekly max warfarin dose:     Target end date:   Indefinite  INR check location:   Anticoagulation Clinic  Preferred lab:     Send INR reminders to:      Indications   Atrial fibrillation (HCC) [I48.91]       Comments:          No Known Allergies Medication Sig  albuterol (PROAIR HFA) 108 (90 Base) MCG/ACT inhaler Inhale 1-2 puffs into the lungs every 6 (six) hours as needed for wheezing or shortness of breath. PLACE ON HOLD UNTIL PATIENT REQUESTS TO FILL  amiodarone (PACERONE) 200 MG tablet TAKE 1 TABLET BY MOUTH ONCE DAILY  carvedilol (COREG) 6.25 MG tablet Take 0.5 tablets (3.125 mg total) by mouth daily with breakfast.  fluticasone (FLOVENT HFA) 44 MCG/ACT inhaler Inhale 2 puffs into the lungs 2 (two) times daily.  folic acid (FOLVITE) 1 MG tablet Take 1 tablet (1 mg total) by mouth daily.  furosemide (LASIX) 20 MG tablet Take 1 tablet (20 mg total) by mouth daily.  hydroxyurea (HYDREA) 500 MG capsule TAKE 2 CAPSULES BY MOUTH ONCE DAILY*MAY TAKE WITH FOOD TO MINIMIZE GI SIDE EFFECTS)  methylPREDNISolone (MEDROL DOSEPAK) 4 MG TBPK tablet Take as directed on packet  SUBOXONE 8-2 MG FILM Place 1 Film under the tongue daily.   umeclidinium bromide (INCRUSE ELLIPTA) 62.5 MCG/INH AEPB Inhale 1 puff into the lungs daily.  warfarin (COUMADIN) 2.5 MG tablet Take one (1) tablet all days of  the week--EXCEPT on Mondays, Wednesdays and Fridays take only one-half (1/2) tablet.   Past Medical History:  Diagnosis Date  . Acute lower GI bleeding 02/15/2017  . Atrial fibrillation /flutter    hx of flutter ablation 2/12  . Cardiomyopathy    EF55% 11/14<<35%   . CHF (congestive heart failure) ([place])   . COPD (chronic obstructive pulmonary disease) ([place])   . GERD (gastroesophageal reflux disease)   . Gout   . Hepatitis    "think I had that once; a long time ago; from dirty needles I think" (12/14/2012)  . Hypertension   . Leukocytosis, unspecified 06/12/2013  . 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 Obesity   . Pneumonia X 1  . Primary polycythemia ([place]) 06/12/2013  . Renal insufficiency   . Shortness of breath    "all the time lately" (12/14/2012)  . Substance abuse ([place])    alcohol; hx cocaine, heroin, crack use  . Syncope    Social History   Socioeconomic History  . Marital status: Single    Spouse name: Not on file  . Number of children: Not on file  . Years of education: Not on file  . Highest education level: Not on file  Occupational History  . Occupation: Retired  Scientific laboratory technician  . Financial resource strain: Not on file  . Food insecurity:    Worry: Not on file    Inability: Not on file  .  Transportation needs:    Medical: Not on file    Non-medical: Not on file  Tobacco Use  . Smoking status: Current Every Day Smoker    Packs/day: 0.10    Years: 50.00    Pack years: 5.00    Types: Cigarettes  . Smokeless tobacco: Never Used  Substance and Sexual Activity  . Alcohol use: Yes    Alcohol/week: 53.4 oz    Types: 12 Cans of beer, 77 Shots of liquor per week    Comment: 02/15/2017 "a pint of vodka/day" plus 12 pack of beer/week"  . Drug use: Yes    Types: "Crack" cocaine, Cocaine, Heroin    Comment: 02/15/2017 "last used crack 4-5 years ago; last used heroin a couple years ago; currently denies any drug use-- on suboxone.   . Sexual activity: Not  Currently  Lifestyle  . Physical activity:    Days per week: Not on file    Minutes per session: Not on file  . Stress: Not on file  Relationships  . Social connections:    Talks on phone: Not on file    Gets together: Not on file    Attends religious service: Not on file    Active member of club or organization: Not on file    Attends meetings of clubs or organizations: Not on file    Relationship status: Not on file  Other Topics Concern  . Not on file  Social History Narrative   Moved here from [place]. Lives with common law wife and grandchildren. He is retired from "general labor."   Family History  Problem Relation Age of Onset  . Diabetes Mother   . Hypertension Mother   . Cirrhosis Father   . Alcohol abuse Father   . Colon cancer Neg Hx   . Rectal cancer Neg Hx   . Stomach cancer Neg Hx    ASSESSMENT Lab Results  Component Value Date   INR 2.8 02/21/2018   INR 1.6 (A) 01/24/2018   INR 2.9 12/27/2017   Anticoagulation Dosing: Description   Take one (1) tablet of your 2.5mg  green-colored warfarin tablets all days of the week--EXCEPT on Sundays, Tuesdays, and Thursdays, take only one-half (1/2) tablet. Repeat this regimen weekly.     INR today: Therapeutic  PLAN Weekly dose was unchanged  There are no Patient Instructions on file for this visit. Patient advised to contact clinic or seek medical attention if signs/symptoms of bleeding or thromboembolism occur.  Patient verbalized understanding by repeating back information and was advised to contact me if further medication-related questions arise. Patient was also provided an information handout.  Follow-up Return in about 1 month (around 03/21/2018).  Flossie Dibble  15 minutes spent face-to-face with the patient during the encounter. 50% of time spent on education. 50% of time was spent on assessment and plan.

## 2018-02-21 NOTE — Progress Notes (Signed)
INTERNAL MEDICINE TEACHING ATTENDING ADDENDUM - Kiandra Sanguinetti M.D  Duration- indefinite, Indication- afib, INR- therapeutic. Agree with pharmacy recommendations as outlined in their note.     

## 2018-03-15 ENCOUNTER — Encounter: Payer: Self-pay | Admitting: Nurse Practitioner

## 2018-03-15 NOTE — Progress Notes (Signed)
Electrophysiology Office Note Date: 03/15/2018  ID:  Ryan, Hamilton [birth year], MRN [id number]  PCP: [name], MD Electrophysiologist: Lovena Le  CC: AF follow up  [name] is a 72 y.o. male seen today for Dr Lovena Le.  He presents today for routine electrophysiology followup.  Since last being seen in our clinic, the patient reports doing very well.  He denies chest pain, palpitations, dyspnea, PND, orthopnea, nausea, vomiting, dizziness, syncope, edema, weight gain, or early satiety.  Past Medical History:  Diagnosis Date  . Acute lower GI bleeding 02/15/2017  . Atrial fibrillation /flutter    hx of flutter ablation 2/12  . Cardiomyopathy    EF55% 11/14<<35%   . CHF (congestive heart failure) ([place])   . COPD (chronic obstructive pulmonary disease) ([place])   . GERD (gastroesophageal reflux disease)   . Gout   . Hepatitis    "think I had that once; a long time ago; from dirty needles I think" (12/14/2012)  . Hypertension   . Leukocytosis, unspecified 06/12/2013  . 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 Obesity   . Pneumonia X 1  . Primary polycythemia ([place]) 06/12/2013  . Renal insufficiency   . Substance abuse ([place])    alcohol; hx cocaine, heroin, crack use  . Syncope    Past Surgical History:  Procedure Laterality Date  . CARDIAC ELECTROPHYSIOLOGY MAPPING AND ABLATION  08/2010   [name] 09/07/2010 (12/14/2012)  . COLONOSCOPY     15-20 years ago had colon in [place]  . EXCISIONAL HEMORRHOIDECTOMY  1970's  . LOOP RECORDER IMPLANT N/A 08/23/2013   Procedure: LOOP RECORDER IMPLANT;  Surgeon: Deboraha Sprang, MD;  Location: [facility] CATH LAB;  Service: Cardiovascular;  Laterality: N/A;  . MULTIPLE EXTRACTIONS WITH ALVEOLOPLASTY Bilateral 01/24/2016   Procedure: MULTIPLE EXTRACTION WITH ALVEOLOPLASTY BILATERAL;  Surgeon: Diona Browner, DDS;  Location: [facility];  Service: Oral Surgery;  Laterality: Bilateral;  . MULTIPLE TOOTH EXTRACTIONS  01/24/2016   MULTIPLE EXTRACTION WITH ALVEOLOPLASTY BILATERAL (Bilateral)    Current Outpatient Medications  Medication Sig Dispense Refill  . albuterol (PROAIR HFA) 108 (90 Base) MCG/ACT inhaler Inhale 1-2 puffs into the lungs every 6 (six) hours as needed for wheezing or shortness of breath. PLACE ON HOLD UNTIL PATIENT REQUESTS TO FILL 18 g 3  . amiodarone (PACERONE) 200 MG tablet TAKE 1 TABLET BY MOUTH ONCE DAILY 30 tablet 11  . carvedilol (COREG) 6.25 MG tablet Take 0.5 tablets (3.125 mg total) by mouth daily with breakfast. 15 tablet 2  . fluticasone (FLOVENT HFA) 44 MCG/ACT inhaler Inhale 2 puffs into the lungs 2 (two) times daily.    . folic acid (FOLVITE) 1 MG tablet Take 1 tablet (1 mg total) by mouth daily. 90 tablet 3  . furosemide (LASIX) 20 MG tablet Take 1 tablet (20 mg total) by mouth daily. 30 tablet 5  . hydroxyurea (HYDREA) 500 MG capsule TAKE 2 CAPSULES BY MOUTH ONCE DAILY*MAY TAKE WITH FOOD TO MINIMIZE GI SIDE EFFECTS) 60 capsule 2  . methylPREDNISolone (MEDROL DOSEPAK) 4 MG TBPK tablet Take as directed on packet 21 tablet 0  . SUBOXONE 8-2 MG FILM Place 1 Film under the tongue daily.     [name] umeclidinium bromide (INCRUSE ELLIPTA) 62.5 MCG/INH AEPB Inhale 1 puff into the lungs daily. 30 each 5  . warfarin (COUMADIN) 2.5 MG tablet Take one (1) tablet all days of the week--EXCEPT on Mondays, Wednesdays and Fridays take only one-half (1/2) tablet. 30 tablet 3   No  current facility-administered medications for this visit.     Allergies:   Patient has no known allergies.   Social History: Social History   Socioeconomic History  . Marital status: Single    Spouse name: Not on file  . Number of children: Not on file  . Years of education: Not on file  . Highest education level: Not on file  Occupational History  . Occupation: Retired  Scientific laboratory technician  . Financial resource strain: Not on file  . Food insecurity:    Worry: Not on file    Inability: Not on file  . Transportation needs:      Medical: Not on file    Non-medical: Not on file  Tobacco Use  . Smoking status: Current Every Day Smoker    Packs/day: 0.10    Years: 50.00    Pack years: 5.00    Types: Cigarettes  . Smokeless tobacco: Never Used  Substance and Sexual Activity  . Alcohol use: Yes    Alcohol/week: 89.0 standard drinks    Types: 12 Cans of beer, 77 Shots of liquor per week    Comment: 02/15/2017 "a pint of vodka/day" plus 12 pack of beer/week"  . Drug use: Yes    Types: "Crack" cocaine, Cocaine, Heroin    Comment: 02/15/2017 "last used crack 4-5 years ago; last used heroin a couple years ago; currently denies any drug use-- on suboxone.   . Sexual activity: Not Currently  Lifestyle  . Physical activity:    Days per week: Not on file    Minutes per session: Not on file  . Stress: Not on file  Relationships  . Social connections:    Talks on phone: Not on file    Gets together: Not on file    Attends religious service: Not on file    Active member of club or organization: Not on file    Attends meetings of clubs or organizations: Not on file    Relationship status: Not on file  . Intimate partner violence:    Fear of current or ex partner: Not on file    Emotionally abused: Not on file    Physically abused: Not on file    Forced sexual activity: Not on file  Other Topics Concern  . Not on file  Social History Narrative   Moved here from [place]. Lives with common law wife and grandchildren. He is retired from "general labor."    Family History: Family History  Problem Relation Age of Onset  . Diabetes Mother   . Hypertension Mother   . Cirrhosis Father   . Alcohol abuse Father   . Colon cancer Neg Hx   . Rectal cancer Neg Hx   . Stomach cancer Neg Hx     Review of Systems: All other systems reviewed and are otherwise negative except as noted above.   Physical Exam: VS:  There were no vitals taken for this visit. , BMI There is no height or weight on file to calculate  BMI. Wt Readings from Last 3 Encounters:  02/07/18 207 lb 14.4 oz (94.3 kg)  01/24/18 206 lb 12.8 oz (93.8 kg)  01/17/18 215 lb (97.5 kg)    GEN- The patient is well appearing, alert and oriented x 3 today.   HEENT: normocephalic, atraumatic; sclera clear, conjunctiva pink; hearing intact; oropharynx clear; neck supple  Lungs- Clear to ausculation bilaterally, normal work of breathing.  No wheezes, rales, rhonchi Heart- Regular rate and rhythm  GI- soft, non-tender,  non-distended, bowel sounds present  Extremities- no clubbing, cyanosis, 1+ BLE edema MS- no significant deformity or atrophy Skin- warm and dry, no rash or lesion  Psych- euthymic mood, full affect Neuro- strength and sensation are intact   EKG:  EKG is not ordered today.  Recent Labs: 01/18/2018: Hemoglobin 13.0; Platelets 179 01/24/2018: ALT 8; BUN 31; Creatinine, Ser 1.41; Potassium 3.5; Sodium 146; TSH 2.840    Other studies Reviewed: Additional studies/ records that were reviewed today include: FPTS records, Dr Tanna Furry office notes  Assessment and Plan:  1.  Paroxysmal atrial fibrillation Only SR documented by EKG's dating back several years Continue low dose amiodarone. Recent LFT's, TSH reviewed. Decrease dose to 100mg  daily today.  Continue Warfarin for CHADS2VASC of 2 No bleeding issues  2.  Sinus bradycardia Has been intermittent and chronic for several years Asymptomatic Continue low dose Coreg  3.  Prior cardiomyopathy Resolved as of last echo 2015 No HF symptoms  4.  HTN He has been off Lasix for about a week with recurrent edema.  Will resume Lasix 20mg  daily today BMET today Follow up with PCP as scheduled for repeat BMET   Current medicines are reviewed at length with the patient today.   The patient does not have concerns regarding his medicines.  The following changes were made today:  none  Labs/ tests ordered today include: none No orders of the defined types were placed in this  encounter.    Disposition:   Follow up with Dr Lovena Le 1 year    Signed, Chanetta Marshall, NP 03/15/2018 9:43 AM   [place] [address] [phone] (office) [phone] (fax)

## 2018-03-17 ENCOUNTER — Encounter: Payer: Self-pay | Admitting: Nurse Practitioner

## 2018-03-17 ENCOUNTER — Ambulatory Visit (INDEPENDENT_AMBULATORY_CARE_PROVIDER_SITE_OTHER): Payer: Medicare Other | Admitting: Nurse Practitioner

## 2018-03-17 VITALS — BP 122/80 | HR 59 | Ht 63.0 in | Wt 216.4 lb

## 2018-03-17 DIAGNOSIS — I10 Essential (primary) hypertension: Secondary | ICD-10-CM | POA: Diagnosis not present

## 2018-03-17 DIAGNOSIS — I4891 Unspecified atrial fibrillation: Secondary | ICD-10-CM

## 2018-03-17 DIAGNOSIS — I480 Paroxysmal atrial fibrillation: Secondary | ICD-10-CM | POA: Diagnosis not present

## 2018-03-17 DIAGNOSIS — Z8679 Personal history of other diseases of the circulatory system: Secondary | ICD-10-CM | POA: Diagnosis not present

## 2018-03-17 DIAGNOSIS — R001 Bradycardia, unspecified: Secondary | ICD-10-CM | POA: Diagnosis not present

## 2018-03-17 LAB — BASIC METABOLIC PANEL
BUN/Creatinine Ratio: 13 (ref 10–24)
BUN: 15 mg/dL (ref 8–27)
CO2: 25 mmol/L (ref 20–29)
Calcium: 8.4 mg/dL — ABNORMAL LOW (ref 8.6–10.2)
Chloride: 107 mmol/L — ABNORMAL HIGH (ref 96–106)
Creatinine, Ser: 1.17 mg/dL (ref 0.76–1.27)
GFR calc Af Amer: 73 mL/min/{1.73_m2} (ref >59)
GFR calc non Af Amer: 63 mL/min/{1.73_m2} (ref >59)
GLUCOSE: 137 mg/dL — AB (ref 65–99)
POTASSIUM: 4.2 mmol/L (ref 3.5–5.2)
SODIUM: 148 mmol/L — AB (ref 134–144)

## 2018-03-17 MED ORDER — FUROSEMIDE 20 MG PO TABS: 20.0000 mg | ORAL_TABLET | Freq: Every day | ORAL | 3 refills | Status: DC

## 2018-03-17 MED ORDER — AMIODARONE HCL 200 MG PO TABS: 100.0000 mg | ORAL_TABLET | Freq: Every day | ORAL | 3 refills | Status: DC

## 2018-03-17 NOTE — Patient Instructions (Addendum)
Medication Instructions:   START TAKING :  LASIX 20 MG ONCE A DAY   AMIODARONE 100 MG ONCE A DAY ( HALF OF 200 MG )  If you need a refill on your cardiac medications before your next appointment, please call your pharmacy.  Labwork: BMET TODAY    Testing/Procedures: NONE ORDERED  TODAY    Follow-Up: Your physician wants you to follow-up in: [place] will receive a reminder letter in the mail two months in advance. If you don't receive a letter, please call our office to schedule the follow-up appointment.     Any Other Special Instructions Will Be Listed Below (If Applicable).

## 2018-03-24 ENCOUNTER — Telehealth: Payer: Self-pay | Admitting: *Deleted

## 2018-03-24 NOTE — Telephone Encounter (Signed)
ATTEMPT TO CALL NO VOICEMAIL SET UP

## 2018-03-24 NOTE — Telephone Encounter (Signed)
-----   Message from Patsey Berthold, NP sent at 03/17/2018  4:10 PM EDT ----- Please notify patient of stable labs. Thanks!

## 2018-03-28 ENCOUNTER — Ambulatory Visit (INDEPENDENT_AMBULATORY_CARE_PROVIDER_SITE_OTHER): Payer: Medicare Other | Admitting: Pharmacist

## 2018-03-28 DIAGNOSIS — Z7901 Long term (current) use of anticoagulants: Secondary | ICD-10-CM

## 2018-03-28 DIAGNOSIS — Z5181 Encounter for therapeutic drug level monitoring: Secondary | ICD-10-CM

## 2018-03-28 DIAGNOSIS — I4891 Unspecified atrial fibrillation: Secondary | ICD-10-CM | POA: Diagnosis present

## 2018-03-28 LAB — POCT INR: INR: 2.3 (ref 2.0–3.0)

## 2018-03-28 NOTE — Patient Instructions (Signed)
Patient instructed to take medications as defined in the Anti-coagulation Track section of this encounter.  Patient instructed to take today's dose.  Patient instructed to take one (1) tablet of your 2.5mg  green-colored warfarin tablets all days of the week--EXCEPT on Sundays, Tuesdays, and Thursdays, take only one-half (1/2) tablet. Repeat this regimen weekly. Patient verbalized understanding of these instructions.

## 2018-03-28 NOTE — Progress Notes (Signed)
Anticoagulation Management [name] is a 72 y.o. male who reports to the clinic for monitoring of warfarin treatment.    Indication: atrial fibrillation   Duration: indefinite Supervising physician: Lenice Pressman, MD  Anticoagulation Clinic Visit History: Patient does not report signs/symptoms of bleeding or thromboembolism  Other recent changes: No diet, medications, lifestyle changes endorsed.  Anticoagulation Episode Summary    Current INR goal:   2.0-3.0  TTR:   69.1 % (5.4 y)  Next INR check:   04/18/2018  INR from last check:   2.3 (03/28/2018)  Weekly max warfarin dose:     Target end date:   Indefinite  INR check location:   Anticoagulation Clinic  Preferred lab:     Send INR reminders to:      Indications   Atrial fibrillation ([place]) [I48.91]       Comments:           No Known Allergies Prior to Admission medications   Medication Sig Start Date End Date Taking? Authorizing Provider  albuterol (PROAIR HFA) 108 (90 Base) MCG/ACT inhaler Inhale 1-2 puffs into the lungs every 6 (six) hours as needed for wheezing or shortness of breath. PLACE ON HOLD UNTIL PATIENT REQUESTS TO FILL 09/27/15  Yes Rabbani, Ricarda Frame, MD  amiodarone (PACERONE) 200 MG tablet Take 0.5 tablets (100 mg total) by mouth daily. 03/17/18  Yes Seiler, Amber K, NP  carvedilol (COREG) 6.25 MG tablet Take 6.25 mg by mouth daily.   Yes [provider]  fluticasone (FLOVENT HFA) 44 MCG/ACT inhaler Inhale 2 puffs into the lungs 2 (two) times daily.   Yes [provider]  folic acid (FOLVITE) 1 MG tablet Take 1 tablet (1 mg total) by mouth daily. 08/03/17  Yes Jule Ser, DO  furosemide (LASIX) 20 MG tablet Take 1 tablet (20 mg total) by mouth daily. 03/17/18 06/15/18 Yes Seiler, Amber K, NP  hydroxyurea (HYDREA) 500 MG capsule TAKE 2 CAPSULES BY MOUTH ONCE DAILY*MAY TAKE WITH FOOD TO MINIMIZE GI SIDE EFFECTS) 01/06/18  Yes Jule Ser, DO  SUBOXONE 8-2 MG FILM Place 1 Film  under the tongue daily.  08/21/15  Yes [provider]  umeclidinium bromide (INCRUSE ELLIPTA) 62.5 MCG/INH AEPB Inhale 1 puff into the lungs daily. 01/24/18  Yes [name], MD  warfarin (COUMADIN) 2.5 MG tablet Take one (1) tablet all days of the week--EXCEPT on Mondays, Wednesdays and Fridays take only one-half (1/2) tablet. 12/09/17  Yes Jule Ser, DO   Past Medical History:  Diagnosis Date  . Acute lower GI bleeding 02/15/2017  . Atrial fibrillation /flutter    hx of flutter ablation 2/12  . Cardiomyopathy    EF55% 11/14<<35%   . CHF (congestive heart failure) ([place])   . COPD (chronic obstructive pulmonary disease) ([place])   . GERD (gastroesophageal reflux disease)   . Gout   . Hepatitis    "think I had that once; a long time ago; from dirty needles I think" (12/14/2012)  . Hypertension   . Leukocytosis, unspecified 06/12/2013  . 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 Obesity   . Pneumonia X 1  . Primary polycythemia ([place]) 06/12/2013  . Renal insufficiency   . Substance abuse ([place])    alcohol; hx cocaine, heroin, crack use  . Syncope    Social History   Socioeconomic History  . Marital status: Single    Spouse name: Not on file  . Number of children: Not on file  . Years of education: Not on  file  . Highest education level: Not on file  Occupational History  . Occupation: Retired  Scientific laboratory technician  . Financial resource strain: Not on file  . Food insecurity:    Worry: Not on file    Inability: Not on file  . Transportation needs:    Medical: Not on file    Non-medical: Not on file  Tobacco Use  . Smoking status: Current Every Day Smoker    Packs/day: 0.10    Years: 50.00    Pack years: 5.00    Types: Cigarettes  . Smokeless tobacco: Never Used  Substance and Sexual Activity  . Alcohol use: Yes    Alcohol/week: 89.0 standard drinks    Types: 12 Cans of beer, 77 Shots of liquor per week    Comment: 02/15/2017 "a pint of vodka/day" plus 12 pack  of beer/week"  . Drug use: Yes    Types: "Crack" cocaine, Cocaine, Heroin    Comment: 02/15/2017 "last used crack 4-5 years ago; last used heroin a couple years ago; currently denies any drug use-- on suboxone.   . Sexual activity: Not Currently  Lifestyle  . Physical activity:    Days per week: Not on file    Minutes per session: Not on file  . Stress: Not on file  Relationships  . Social connections:    Talks on phone: Not on file    Gets together: Not on file    Attends religious service: Not on file    Active member of club or organization: Not on file    Attends meetings of clubs or organizations: Not on file    Relationship status: Not on file  Other Topics Concern  . Not on file  Social History Narrative   Moved here from [place]. Lives with common law wife and grandchildren. He is retired from "general labor."   Family History  Problem Relation Age of Onset  . Diabetes Mother   . Hypertension Mother   . Cirrhosis Father   . Alcohol abuse Father   . Colon cancer Neg Hx   . Rectal cancer Neg Hx   . Stomach cancer Neg Hx     ASSESSMENT Recent Results: The most recent result is correlated with 13.75 mg per week: Lab Results  Component Value Date   INR 2.3 03/28/2018   INR 2.8 02/21/2018   INR 1.6 (A) 01/24/2018    Anticoagulation Dosing: Description   Take one (1) tablet of your 2.5mg  green-colored warfarin tablets all days of the week--EXCEPT on Sundays, Tuesdays, and Thursdays, take only one-half (1/2) tablet. Repeat this regimen weekly.     INR today: Therapeutic  PLAN Weekly dose was unchanged.  Patient Instructions  Patient instructed to take medications as defined in the Anti-coagulation Track section of this encounter.  Patient instructed to take today's dose.  Patient instructed to take one (1) tablet of your 2.5mg  green-colored warfarin tablets all days of the week--EXCEPT on Sundays, Tuesdays, and Thursdays, take only one-half (1/2) tablet.  Repeat this regimen weekly. Patient verbalized understanding of these instructions.     Patient advised to contact clinic or seek medical attention if signs/symptoms of bleeding or thromboembolism occur.  Patient verbalized understanding by repeating back information and was advised to contact me if further medication-related questions arise. Patient was also provided an information handout.  Follow-up Return in 3 weeks (on 04/18/2018) for Follow up INR at 0845h.  Pennie Banter, PharmD, CACP, CPP  15 minutes spent face-to-face with the patient  during the encounter. 50% of time spent on education. 50% of time was spent on fingerstick point of care INR sample collection, processing, results determination, and documentation in [email or web address].

## 2018-03-28 NOTE — Progress Notes (Signed)
INTERNAL MEDICINE TEACHING ATTENDING ADDENDUM  I agree with pharmacy recommendations as outlined in their note.   Alexander N Raines, MD  

## 2018-04-07 ENCOUNTER — Other Ambulatory Visit: Payer: Self-pay | Admitting: Internal Medicine

## 2018-04-07 DIAGNOSIS — D45 Polycythemia vera: Secondary | ICD-10-CM

## 2018-04-10 ENCOUNTER — Emergency Department (HOSPITAL_COMMUNITY): Payer: Medicare Other

## 2018-04-10 ENCOUNTER — Emergency Department (HOSPITAL_COMMUNITY)
Admission: EM | Admit: 2018-04-10 | Discharge: 2018-04-10 | Disposition: A | Discharge status: Home / Self Care | Payer: Medicare Other | Source: Home / Self Care | Attending: Emergency Medicine | Admitting: Emergency Medicine

## 2018-04-10 ENCOUNTER — Encounter (HOSPITAL_COMMUNITY): Payer: Self-pay | Admitting: *Deleted

## 2018-04-10 ENCOUNTER — Other Ambulatory Visit: Payer: Self-pay

## 2018-04-10 DIAGNOSIS — I110 Hypertensive heart disease with heart failure: Secondary | ICD-10-CM | POA: Insufficient documentation

## 2018-04-10 DIAGNOSIS — J441 Chronic obstructive pulmonary disease with (acute) exacerbation: Secondary | ICD-10-CM | POA: Diagnosis not present

## 2018-04-10 DIAGNOSIS — I509 Heart failure, unspecified: Secondary | ICD-10-CM | POA: Insufficient documentation

## 2018-04-10 DIAGNOSIS — Z79899 Other long term (current) drug therapy: Secondary | ICD-10-CM | POA: Insufficient documentation

## 2018-04-10 DIAGNOSIS — F17210 Nicotine dependence, cigarettes, uncomplicated: Secondary | ICD-10-CM

## 2018-04-10 LAB — BRAIN NATRIURETIC PEPTIDE: B NATRIURETIC PEPTIDE 5: 194.3 pg/mL — AB (ref 0.0–100.0)

## 2018-04-10 LAB — I-STAT TROPONIN, ED: Troponin i, poc: 0 ng/mL (ref 0.00–0.08)

## 2018-04-10 LAB — CBC
HEMATOCRIT: 40 % (ref 39.0–52.0)
HEMOGLOBIN: 13.6 g/dL (ref 13.0–17.0)
MCH: 42.6 pg — ABNORMAL HIGH (ref 26.0–34.0)
MCHC: 34 g/dL (ref 30.0–36.0)
MCV: 125.4 fL — AB (ref 78.0–100.0)
Platelets: 104 10*3/uL — ABNORMAL LOW (ref 150–400)
RBC: 3.19 MIL/uL — AB (ref 4.22–5.81)
RDW: 12.7 % (ref 11.5–15.5)
WBC: 4.9 10*3/uL (ref 4.0–10.5)

## 2018-04-10 LAB — PROTIME-INR
INR: 2.91
Prothrombin Time: 30.1 seconds — ABNORMAL HIGH (ref 11.4–15.2)

## 2018-04-10 LAB — BASIC METABOLIC PANEL
ANION GAP: 10 (ref 5–15)
BUN: 14 mg/dL (ref 8–23)
CO2: 29 mmol/L (ref 22–32)
Calcium: 8.4 mg/dL — ABNORMAL LOW (ref 8.9–10.3)
Chloride: 102 mmol/L (ref 98–111)
Creatinine, Ser: 1.23 mg/dL (ref 0.61–1.24)
GFR calc Af Amer: >60 mL/min (ref >60)
GFR calc non Af Amer: 58 mL/min — ABNORMAL LOW (ref >60)
Glucose, Bld: 107 mg/dL — ABNORMAL HIGH (ref 70–99)
Potassium: 3.3 mmol/L — ABNORMAL LOW (ref 3.5–5.1)
Sodium: 141 mmol/L (ref 135–145)

## 2018-04-10 MED ORDER — ALBUTEROL SULFATE (2.5 MG/3ML) 0.083% IN NEBU
5.0000 mg | INHALATION_SOLUTION | Freq: Once | RESPIRATORY_TRACT | Status: AC
  Administered 2018-04-10: 5 mg via RESPIRATORY_TRACT
  Filled 2018-04-10: qty 6

## 2018-04-10 MED ORDER — PREDNISONE 20 MG PO TABS: ORAL_TABLET | ORAL | 0 refills | Status: DC

## 2018-04-10 MED ORDER — METHYLPREDNISOLONE SODIUM SUCC 125 MG IJ SOLR
125.0000 mg | Freq: Once | INTRAMUSCULAR | Status: AC
  Administered 2018-04-10: 125 mg via INTRAVENOUS
  Filled 2018-04-10: qty 2

## 2018-04-10 NOTE — ED Notes (Signed)
Monitored pulse ox while ambulating: Before ambulating: 98% O2 RA, P: 72bpm During ambulation: 94% O2 RA, P: 90bpm After ambulation: 94% O2 RA, P: 70bpm

## 2018-04-10 NOTE — ED Provider Notes (Signed)
[facility] [facility] EMERGENCY DEPARTMENT Provider Note   CSN: [id number] Arrival date & time: 04/10/18  1402     History   Chief Complaint Chief Complaint  Patient presents with  . Shortness of Breath    HPI [name] is a 72 y.o. male.  Patient is a 10-year-old male with a history of COPD, CHF, atrial fibrillation on Coumadin and hypertension who presents with shortness of breath and wheezing.  He states he has some chronic shortness of breath and chronic wheezing but it has been worse over the last 2 to 3 days.  He has a worsening cough which is dry.  He has some soreness across his chest but only with coughing.  He denies any other chest pain.  No known fevers.  No increased leg swelling.  No vomiting or diarrhea.  He is been using his albuterol inhaler without improvement in symptoms.  He does not use oxygen at home.  He has not have a nebulizer machine.     Past Medical History:  Diagnosis Date  . Acute lower GI bleeding 02/15/2017  . Atrial fibrillation /flutter    hx of flutter ablation 2/12  . Cardiomyopathy    EF55% 11/14<<35%   . CHF (congestive heart failure) ([place])   . COPD (chronic obstructive pulmonary disease) ([place])   . GERD (gastroesophageal reflux disease)   . Gout   . Hepatitis    "think I had that once; a long time ago; from dirty needles I think" (12/14/2012)  . Hypertension   . Leukocytosis, unspecified 06/12/2013  . 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 Obesity   . Pneumonia X 1  . Primary polycythemia ([place]) 06/12/2013  . Renal insufficiency   . Substance abuse ([place])    alcohol; hx cocaine, heroin, crack use  . Syncope     Patient Active Problem List   Diagnosis Date Noted  . Long term current use of amiodarone 01/24/2018  . GI bleed 02/15/2017  . Pancytopenia, acquired ([place]) 11/26/2016  . Tubular adenoma 11/25/2016  . Gout 11/02/2016  . Preventive measure 05/25/2016  . Hepatitis C antibody test positive     . Hematuria 11/09/2014  . Acute kidney injury ([place]) 11/01/2014  . Leukopenia due to antineoplastic chemotherapy ([place]) 08/24/2014  . Thrombocytopenia due to drugs 07/25/2014  . Encounter for screening colonoscopy 07/02/2014  . Chronic anticoagulation 07/02/2014  . Prolonged QT interval 06/05/2014  . Sinus bradycardia 06/05/2014  . Opioid dependence ([place]) 06/05/2014  . Diastolic dysfunction 19/14/7829  . COPD (chronic obstructive pulmonary disease) ([place]) 05/30/2014  . Health care maintenance 05/24/2014  . Anemia due to antineoplastic chemotherapy 04/24/2014  . Thyroid mass 08/26/2013  . Syncope 08/22/2013  . 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 Polycythemia vera ([place]) 01/03/2013  . Malnutrition of moderate degree ([place]) 12/15/2012  . Tobacco abuse 09/15/2012  . Heart failure with preserved left ventricular function (HFpEF) ([place])   . Alcoholism ([place]) 09/30/2010  . Hypertension 09/30/2010  . Atrial fibrillation [facility])     Past Surgical History:  Procedure Laterality Date  . CARDIAC ELECTROPHYSIOLOGY MAPPING AND ABLATION  08/2010   [name] 09/07/2010 (12/14/2012)  . COLONOSCOPY     15-20 years ago had colon in [place]  . EXCISIONAL HEMORRHOIDECTOMY  1970's  . LOOP RECORDER IMPLANT N/A 08/23/2013   Procedure: LOOP RECORDER IMPLANT;  Surgeon: Deboraha Sprang, MD;  Location: [facility] CATH LAB;  Service: Cardiovascular;  Laterality: N/A;  . MULTIPLE EXTRACTIONS WITH ALVEOLOPLASTY Bilateral 01/24/2016   Procedure: MULTIPLE  EXTRACTION WITH ALVEOLOPLASTY BILATERAL;  Surgeon: Diona Browner, DDS;  Location: [facility];  Service: Oral Surgery;  Laterality: Bilateral;  . MULTIPLE TOOTH EXTRACTIONS  01/24/2016   MULTIPLE EXTRACTION WITH ALVEOLOPLASTY BILATERAL (Bilateral)        Home Medications    Prior to Admission medications   Medication Sig Start Date End Date Taking? Authorizing Provider  albuterol (PROAIR HFA) 108 (90 Base) MCG/ACT inhaler Inhale 1-2 puffs into the lungs every 6 (six) hours as  needed for wheezing or shortness of breath. PLACE ON HOLD UNTIL PATIENT REQUESTS TO FILL 09/27/15   Juluis Mire, MD  amiodarone (PACERONE) 200 MG tablet Take 0.5 tablets (100 mg total) by mouth daily. 03/17/18   Chanetta Marshall K, NP  carvedilol (COREG) 6.25 MG tablet Take 6.25 mg by mouth daily.    [provider]  fluticasone (FLOVENT HFA) 44 MCG/ACT inhaler Inhale 2 puffs into the lungs 2 (two) times daily.    [provider]  folic acid (FOLVITE) 1 MG tablet Take 1 tablet (1 mg total) by mouth daily. 08/03/17   Jule Ser, DO  furosemide (LASIX) 20 MG tablet Take 1 tablet (20 mg total) by mouth daily. 03/17/18 06/15/18  Seiler, Amber K, NP  hydroxyurea (HYDREA) 500 MG capsule TAKE 2 CAPSULES BY MOUTH EVERY DAY. MAY TAKE WITH FOOD TO MINIMIZE GI SIDE EFFECTS 04/08/18   Lucious Groves, DO  predniSONE (DELTASONE) 20 MG tablet 2 tabs po daily x 4 days 04/10/18   Malvin Johns, MD  SUBOXONE 8-2 MG FILM Place 1 Film under the tongue daily.  08/21/15   [provider]  umeclidinium bromide (INCRUSE ELLIPTA) 62.5 MCG/INH AEPB Inhale 1 puff into the lungs daily. 01/24/18   [name], MD  warfarin (COUMADIN) 2.5 MG tablet Take one (1) tablet all days of the week--EXCEPT on Mondays, Wednesdays and Fridays take only one-half (1/2) tablet. 12/09/17   Jule Ser, DO    Family History Family History  Problem Relation Age of Onset  . Diabetes Mother   . Hypertension Mother   . Cirrhosis Father   . Alcohol abuse Father   . Colon cancer Neg Hx   . Rectal cancer Neg Hx   . Stomach cancer Neg Hx     Social History Social History   Tobacco Use  . Smoking status: Current Every Day Smoker    Packs/day: 0.10    Years: 50.00    Pack years: 5.00    Types: Cigarettes  . Smokeless tobacco: Never Used  Substance Use Topics  . Alcohol use: Yes    Alcohol/week: 89.0 standard drinks    Types: 12 Cans of beer, 77 Shots of liquor per week    Comment: 02/15/2017 "a pint of  vodka/day" plus 12 pack of beer/week"  . Drug use: Yes    Types: "Crack" cocaine, Cocaine, Heroin    Comment: 02/15/2017 "last used crack 4-5 years ago; last used heroin a couple years ago; currently denies any drug use-- on suboxone.      Allergies   Patient has no known allergies.   Review of Systems Review of Systems  Constitutional: Negative for chills, diaphoresis, fatigue and fever.  HENT: Negative for congestion, rhinorrhea and sneezing.   Eyes: Negative.   Respiratory: Positive for cough, shortness of breath and wheezing. Negative for chest tightness.   Cardiovascular: Positive for chest pain (Only with coughing, not with deep breathing). Negative for leg swelling.  Gastrointestinal: Negative for abdominal pain, blood in stool, diarrhea,  nausea and vomiting.  Genitourinary: Negative for difficulty urinating, flank pain, frequency and hematuria.  Musculoskeletal: Negative for arthralgias and back pain.  Skin: Negative for rash.  Neurological: Negative for dizziness, speech difficulty, weakness, numbness and headaches.     Physical Exam Updated Vital Signs BP (!) 141/70   Pulse 75   Temp 99.2 F (37.3 C) (Oral)   Resp 14   Ht 5\' 6"  (1.676 m)   Wt 98 kg   SpO2 94%   BMI 34.86 kg/m   Physical Exam  Constitutional: He is oriented to person, place, and time. He appears well-developed and well-nourished.  HENT:  Head: Normocephalic and atraumatic.  Eyes: Pupils are equal, round, and reactive to light.  Neck: Normal range of motion. Neck supple.  Cardiovascular: Normal rate, regular rhythm and normal heart sounds.  Pulmonary/Chest: Effort normal. No respiratory distress. He has decreased breath sounds. He has wheezes. He has no rales. He exhibits no tenderness.  Abdominal: Soft. Bowel sounds are normal. There is no tenderness. There is no rebound and no guarding.  Musculoskeletal: Normal range of motion. He exhibits edema (1+ pitting edema bilaterally).    Lymphadenopathy:    He has no cervical adenopathy.  Neurological: He is alert and oriented to person, place, and time.  Skin: Skin is warm and dry. No rash noted.  Psychiatric: He has a normal mood and affect.     ED Treatments / Results  Labs (all labs ordered are listed, but only abnormal results are displayed) Labs Reviewed  BASIC METABOLIC PANEL - Abnormal; Notable for the following components:      Result Value   Potassium 3.3 (*)    Glucose, Bld 107 (*)    Calcium 8.4 (*)    GFR calc non Af Amer 58 (*)    All other components within normal limits  CBC - Abnormal; Notable for the following components:   RBC 3.19 (*)    MCV 125.4 (*)    MCH 42.6 (*)    Platelets 104 (*)    All other components within normal limits  BRAIN NATRIURETIC PEPTIDE - Abnormal; Notable for the following components:   B Natriuretic Peptide 194.3 (*)    All other components within normal limits  PROTIME-INR - Abnormal; Notable for the following components:   Prothrombin Time 30.1 (*)    All other components within normal limits  I-STAT TROPONIN, ED    EKG EKG Interpretation  Date/Time:  Sunday April 10 2018 15:56:56 EDT Ventricular Rate:  103 PR Interval:    QRS Duration: 91 QT Interval:  372 QTC Calculation: 487 R Axis:   16 Text Interpretation:  Atrial flutter Abnormal R-wave progression, early transition Minimal ST depression, lateral leads Borderline prolonged QT interval Confirmed by Malvin Johns [phone]) on 04/10/2018 4:13:45 PM   Radiology Dg Chest 2 View  Result Date: 04/10/2018 CLINICAL DATA:  Pt reports being sob, mid chest pains and a dry cough x 2 days and has some swelling to his legs. Reports having cardiac hx. EXAM: CHEST - 2 VIEW COMPARISON:  09/30/2017 FINDINGS: Cardiac silhouette is mildly enlarged. No mediastinal or hilar masses. No evidence of adenopathy. Clear lungs.  No pleural effusion or pneumothorax. Skeletal structures are demineralized but intact.  IMPRESSION: No active cardiopulmonary disease. Electronically Signed   By: [name] M.D.   On: 04/10/2018 15:26    Procedures Procedures (including critical care time)  Medications Ordered in ED Medications  methylPREDNISolone sodium succinate (SOLU-MEDROL) 125 mg/2 mL injection 125  mg (125 mg Intravenous Given 04/10/18 1637)  albuterol (PROVENTIL) (2.5 MG/3ML) 0.083% nebulizer solution 5 mg (5 mg Nebulization Given 04/10/18 1637)  albuterol (PROVENTIL) (2.5 MG/3ML) 0.083% nebulizer solution 5 mg (5 mg Nebulization Given 04/10/18 1805)     Initial Impression / Assessment and Plan / ED Course  I have reviewed the triage vital signs and the nursing notes.  Pertinent labs & imaging results that were available during my care of the patient were reviewed by me and considered in my medical decision making (see chart for details).     Patient presents with shortness of breath.  His symptoms are consistent with a COPD exacerbation.  There is no evidence of fluid overload.  His BNP is only minimally elevated.  His chest x-ray is clear without evidence of fluid or pneumonia.  He is afebrile.  His other labs are non-concerning.  Of note, he was documented at one point to have a heart rate of 35 but I feel like this was an error.  He was given breathing treatments in the ED as well as a dose of steroids.  He is feeling 100% better and is able to ambulate without shortness of breath or hypoxia.  His EKG does not show ischemic changes.  He has no other symptoms that would be more concerning for ACS.  He was discharged home in good condition.  He was started on a continue prednisone burst.  He has albuterol inhalers to use at home and I instructed him to use it regularly every 4-6 hours for the next few days and then as needed.  He was encouraged to have close follow-up with his PCP.  Return precautions were given.  Final Clinical Impressions(s) / ED Diagnoses   Final diagnoses:  COPD exacerbation [facility])     ED Discharge Orders         Ordered    predniSONE (DELTASONE) 20 MG tablet     04/10/18 1934           Malvin Johns, MD 04/10/18 1937

## 2018-04-10 NOTE — ED Notes (Signed)
RN will draw blood with IV start

## 2018-04-10 NOTE — ED Triage Notes (Signed)
Pt reports being sob x 2 days and has some swelling to his legs. Reports having cardiac hx. Denies recent cough.

## 2018-04-10 NOTE — ED Notes (Signed)
Patient verbalizes understanding of discharge instructions. Opportunity for questioning and answers were provided. Armband removed by staff, pt discharged from ED.  

## 2018-04-11 ENCOUNTER — Inpatient Hospital Stay (HOSPITAL_COMMUNITY)
Admission: EM | Admit: 2018-04-11 | Discharge: 2018-04-14 | DRG: 190 | Disposition: A | Discharge status: Home / Self Care | Payer: Medicare Other | Attending: Student in an Organized Health Care Education/Training Program | Admitting: Student in an Organized Health Care Education/Training Program

## 2018-04-11 ENCOUNTER — Emergency Department (HOSPITAL_COMMUNITY): Payer: Medicare Other

## 2018-04-11 ENCOUNTER — Encounter (HOSPITAL_COMMUNITY): Payer: Self-pay | Admitting: Emergency Medicine

## 2018-04-11 DIAGNOSIS — R7989 Other specified abnormal findings of blood chemistry: Secondary | ICD-10-CM | POA: Diagnosis present

## 2018-04-11 DIAGNOSIS — I5030 Unspecified diastolic (congestive) heart failure: Secondary | ICD-10-CM

## 2018-04-11 DIAGNOSIS — Z7951 Long term (current) use of inhaled steroids: Secondary | ICD-10-CM

## 2018-04-11 DIAGNOSIS — D45 Polycythemia vera: Secondary | ICD-10-CM | POA: Diagnosis present

## 2018-04-11 DIAGNOSIS — N183 Chronic kidney disease, stage 3 (moderate): Secondary | ICD-10-CM | POA: Diagnosis present

## 2018-04-11 DIAGNOSIS — Z79899 Other long term (current) drug therapy: Secondary | ICD-10-CM

## 2018-04-11 DIAGNOSIS — F17210 Nicotine dependence, cigarettes, uncomplicated: Secondary | ICD-10-CM | POA: Diagnosis present

## 2018-04-11 DIAGNOSIS — I5040 Unspecified combined systolic (congestive) and diastolic (congestive) heart failure: Secondary | ICD-10-CM | POA: Diagnosis present

## 2018-04-11 DIAGNOSIS — I959 Hypotension, unspecified: Secondary | ICD-10-CM | POA: Diagnosis present

## 2018-04-11 DIAGNOSIS — I429 Cardiomyopathy, unspecified: Secondary | ICD-10-CM | POA: Diagnosis present

## 2018-04-11 DIAGNOSIS — I130 Hypertensive heart and chronic kidney disease with heart failure and stage 1 through stage 4 chronic kidney disease, or unspecified chronic kidney disease: Secondary | ICD-10-CM | POA: Diagnosis present

## 2018-04-11 DIAGNOSIS — N179 Acute kidney failure, unspecified: Secondary | ICD-10-CM | POA: Diagnosis present

## 2018-04-11 DIAGNOSIS — Z01818 Encounter for other preprocedural examination: Secondary | ICD-10-CM

## 2018-04-11 DIAGNOSIS — I5033 Acute on chronic diastolic (congestive) heart failure: Secondary | ICD-10-CM | POA: Diagnosis present

## 2018-04-11 DIAGNOSIS — T380X5A Adverse effect of glucocorticoids and synthetic analogues, initial encounter: Secondary | ICD-10-CM | POA: Diagnosis present

## 2018-04-11 DIAGNOSIS — J441 Chronic obstructive pulmonary disease with (acute) exacerbation: Principal | ICD-10-CM | POA: Diagnosis present

## 2018-04-11 DIAGNOSIS — K219 Gastro-esophageal reflux disease without esophagitis: Secondary | ICD-10-CM | POA: Diagnosis present

## 2018-04-11 DIAGNOSIS — D72829 Elevated white blood cell count, unspecified: Secondary | ICD-10-CM | POA: Diagnosis present

## 2018-04-11 DIAGNOSIS — I4891 Unspecified atrial fibrillation: Secondary | ICD-10-CM | POA: Diagnosis present

## 2018-04-11 DIAGNOSIS — R739 Hyperglycemia, unspecified: Secondary | ICD-10-CM | POA: Diagnosis present

## 2018-04-11 DIAGNOSIS — R0602 Shortness of breath: Secondary | ICD-10-CM

## 2018-04-11 DIAGNOSIS — J9601 Acute respiratory failure with hypoxia: Secondary | ICD-10-CM | POA: Diagnosis present

## 2018-04-11 DIAGNOSIS — R778 Other specified abnormalities of plasma proteins: Secondary | ICD-10-CM | POA: Diagnosis present

## 2018-04-11 DIAGNOSIS — Z7901 Long term (current) use of anticoagulants: Secondary | ICD-10-CM

## 2018-04-11 DIAGNOSIS — I248 Other forms of acute ischemic heart disease: Secondary | ICD-10-CM | POA: Diagnosis present

## 2018-04-11 DIAGNOSIS — R791 Abnormal coagulation profile: Secondary | ICD-10-CM | POA: Diagnosis present

## 2018-04-11 DIAGNOSIS — Z23 Encounter for immunization: Secondary | ICD-10-CM

## 2018-04-11 LAB — COMPREHENSIVE METABOLIC PANEL
ALT: 26 U/L (ref 0–44)
ANION GAP: 13 (ref 5–15)
AST: 40 U/L (ref 15–41)
Albumin: 3.2 g/dL — ABNORMAL LOW (ref 3.5–5.0)
Alkaline Phosphatase: 107 U/L (ref 38–126)
BUN: 27 mg/dL — ABNORMAL HIGH (ref 8–23)
CHLORIDE: 104 mmol/L (ref 98–111)
CO2: 26 mmol/L (ref 22–32)
Calcium: 8.6 mg/dL — ABNORMAL LOW (ref 8.9–10.3)
Creatinine, Ser: 1.95 mg/dL — ABNORMAL HIGH (ref 0.61–1.24)
GFR calc non Af Amer: 33 mL/min — ABNORMAL LOW (ref >60)
GFR, EST AFRICAN AMERICAN: 38 mL/min — AB (ref >60)
Glucose, Bld: 168 mg/dL — ABNORMAL HIGH (ref 70–99)
Potassium: 3.4 mmol/L — ABNORMAL LOW (ref 3.5–5.1)
Sodium: 143 mmol/L (ref 135–145)
Total Bilirubin: 0.8 mg/dL (ref 0.3–1.2)
Total Protein: 6.9 g/dL (ref 6.5–8.1)

## 2018-04-11 LAB — I-STAT ARTERIAL BLOOD GAS, ED
Acid-Base Excess: 2 mmol/L (ref 0.0–2.0)
BICARBONATE: 29.3 mmol/L — AB (ref 20.0–28.0)
O2 Saturation: 99 %
TCO2: 31 mmol/L (ref 22–32)
pCO2 arterial: 55.9 mmHg — ABNORMAL HIGH (ref 32.0–48.0)
pH, Arterial: 7.326 — ABNORMAL LOW (ref 7.350–7.450)
pO2, Arterial: 160 mmHg — ABNORMAL HIGH (ref 83.0–108.0)

## 2018-04-11 LAB — CBC
HCT: 42.9 % (ref 39.0–52.0)
Hemoglobin: 14.4 g/dL (ref 13.0–17.0)
MCH: 43.9 pg — ABNORMAL HIGH (ref 26.0–34.0)
MCHC: 33.6 g/dL (ref 30.0–36.0)
MCV: 130.8 fL — AB (ref 78.0–100.0)
PLATELETS: 215 10*3/uL (ref 150–400)
RBC: 3.28 MIL/uL — ABNORMAL LOW (ref 4.22–5.81)
RDW: 13.1 % (ref 11.5–15.5)
WBC: 19.7 10*3/uL — AB (ref 4.0–10.5)

## 2018-04-11 LAB — BRAIN NATRIURETIC PEPTIDE: B Natriuretic Peptide: 441.4 pg/mL — ABNORMAL HIGH (ref 0.0–100.0)

## 2018-04-11 LAB — TROPONIN I: Troponin I: 0.06 ng/mL (ref <0.03)

## 2018-04-11 MED ORDER — LACTATED RINGERS IV BOLUS
1000.0000 mL | Freq: Once | INTRAVENOUS | Status: DC
  Administered 2018-04-12: 1000 mL via INTRAVENOUS

## 2018-04-11 MED ORDER — PROPOFOL 10 MG/ML IV BOLUS
INTRAVENOUS | Status: AC
  Filled 2018-04-11: qty 20

## 2018-04-11 MED ORDER — SODIUM CHLORIDE 0.9 % IV SOLN
INTRAVENOUS | Status: AC | PRN
  Administered 2018-04-11: 1000 mL via INTRAVENOUS

## 2018-04-11 NOTE — ED Provider Notes (Signed)
I saw and evaluated the patient, reviewed the resident's note and I agree with the findings and plan.  EKG: EKG Interpretation  Date/Time:  Monday April 11 2018 18:46:22 EDT Ventricular Rate:  105 PR Interval:    QRS Duration: 98 QT Interval:  332 QTC Calculation: 439 R Axis:   22 Text Interpretation:  Sinus tachycardia Probable left atrial enlargement Borderline repol abnormality, lateral leads Baseline wander in lead(s) I II aVR Confirmed by Dorie Rank [phone]) on 04/11/2018 8:10:23 PM  .Critical Care Performed by: Dorie Rank, MD Authorized by: Dorie Rank, MD   Critical care provider statement:    Critical care time (minutes):  35   Critical care was time spent personally by me on the following activities:  Discussions with consultants, evaluation of patient's response to treatment, examination of patient, ordering and performing treatments and interventions, ordering and review of laboratory studies, ordering and review of radiographic studies, pulse oximetry, re-evaluation of patient's condition, obtaining history from patient or surrogate and review of old charts    Patient presented to the emergency room for evaluation of shortness of breath.  Patient has history of COPD.  Patient was initially very altered and dyspneic.  We contemplated intubating the patient however he started to improve with BiPAP.  Patient is calmer and appears breathing easily.  He is answering questions appropriately we will continue with ED work-up.   Dorie Rank, MD 04/11/18 2011

## 2018-04-11 NOTE — Progress Notes (Signed)
RT NOTES: Pt brought in by EMS on CPAP. Placed on hospital V60 bipap machine 18/7 45%. Pt's breathing labored and patient diaphoretic. SATS 100%, HR 101. Awaiting further orders from MD.

## 2018-04-11 NOTE — ED Triage Notes (Signed)
Pt to ED via [facility] on C-Pap.  Bystanders st's pt has been short of breath x's 2 days. Was seen here yesterday for same.  Pt was given Solu Medrol 125mg , Magnesium 2 g  And 2 nebs.

## 2018-04-11 NOTE — ED Provider Notes (Signed)
[facility] EMERGENCY DEPARTMENT Provider Note   CSN: [id number] Arrival date & time: 04/11/18  1834     History   Chief Complaint Chief Complaint  Patient presents with  . Respiratory Distress    HPI [name] is a 72 y.o. male.  HPI  72yo M with PMH of  COPD, CHF, atrial fibrillation on Coumadin and hypertension who presents with respiratory distress.  Patient states that for the last day he has been acutely worse shortness of breath.  Patient was seen yesterday at [facility] for COPD exacerbation discharged with p.o steroids and advised to use albuterol inhaler.  Patient grossly worsening shortness of breath despite interventions.  This afternoon patient was found in his car by his neighbors unresponsive.  EMS arrived finding patient with oxygen saturations in the low 70% on room air.  Patient was placed on CPAP with improvement to 88% by EMS.  Patient given duo nebs, 125 Solu-Medrol, 2 mg of mag on route by EMS.  Patient initially noncontributory to evaluation.  Past Medical History:  Diagnosis Date  . Acute lower GI bleeding 02/15/2017  . Atrial fibrillation /flutter    hx of flutter ablation 2/12  . Cardiomyopathy    EF55% 11/14<<35%   . CHF (congestive heart failure) ([place])   . COPD (chronic obstructive pulmonary disease) ([place])   . GERD (gastroesophageal reflux disease)   . Gout   . Hepatitis    "think I had that once; a long time ago; from dirty needles I think" (12/14/2012)  . Hypertension   . Leukocytosis, unspecified 06/12/2013  . 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 Obesity   . Pneumonia X 1  . Primary polycythemia ([place]) 06/12/2013  . Renal insufficiency   . Substance abuse ([place])    alcohol; hx cocaine, heroin, crack use  . Syncope     Patient Active Problem List   Diagnosis Date Noted  . Long term current use of amiodarone 01/24/2018  . GI bleed 02/15/2017  . Pancytopenia, acquired ([place]) 11/26/2016  . Tubular  adenoma 11/25/2016  . Gout 11/02/2016  . Preventive measure 05/25/2016  . Hepatitis C antibody test positive   . Hematuria 11/09/2014  . Acute kidney injury ([place]) 11/01/2014  . Leukopenia due to antineoplastic chemotherapy ([place]) 08/24/2014  . Thrombocytopenia due to drugs 07/25/2014  . Encounter for screening colonoscopy 07/02/2014  . Chronic anticoagulation 07/02/2014  . Prolonged QT interval 06/05/2014  . Sinus bradycardia 06/05/2014  . Opioid dependence ([place]) 06/05/2014  . Diastolic dysfunction 63/87/5643  . COPD (chronic obstructive pulmonary disease) ([place]) 05/30/2014  . Health care maintenance 05/24/2014  . Anemia due to antineoplastic chemotherapy 04/24/2014  . Thyroid mass 08/26/2013  . Syncope 08/22/2013  . 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 Malnutrition of moderate degree ([place]) 12/15/2012  . Tobacco abuse 09/15/2012  . Heart failure with preserved left ventricular function (HFpEF) ([place])   . Alcoholism ([place]) 09/30/2010  . Hypertension 09/30/2010  . Atrial fibrillation [facility])     Past Surgical History:  Procedure Laterality Date  . CARDIAC ELECTROPHYSIOLOGY MAPPING AND ABLATION  08/2010   [name] 09/07/2010 (12/14/2012)  . COLONOSCOPY     15-20 years ago had colon in [place]  . EXCISIONAL HEMORRHOIDECTOMY  1970's  . LOOP RECORDER IMPLANT N/A 08/23/2013   Procedure: LOOP RECORDER IMPLANT;  Surgeon: Deboraha Sprang, MD;  Location: [facility] CATH LAB;  Service: Cardiovascular;  Laterality: N/A;  . MULTIPLE EXTRACTIONS WITH ALVEOLOPLASTY Bilateral 01/24/2016  Procedure: MULTIPLE EXTRACTION WITH ALVEOLOPLASTY BILATERAL;  Surgeon: Diona Browner, DDS;  Location: [facility];  Service: Oral Surgery;  Laterality: Bilateral;  . MULTIPLE TOOTH EXTRACTIONS  01/24/2016   MULTIPLE EXTRACTION WITH ALVEOLOPLASTY BILATERAL (Bilateral)        Home Medications    Prior to Admission medications   Medication Sig Start Date End Date Taking? Authorizing  Provider  albuterol (PROAIR HFA) 108 (90 Base) MCG/ACT inhaler Inhale 1-2 puffs into the lungs every 6 (six) hours as needed for wheezing or shortness of breath. PLACE ON HOLD UNTIL PATIENT REQUESTS TO FILL 09/27/15   Juluis Mire, MD  amiodarone (PACERONE) 200 MG tablet Take 0.5 tablets (100 mg total) by mouth daily. 03/17/18   Chanetta Marshall K, NP  carvedilol (COREG) 6.25 MG tablet Take 6.25 mg by mouth daily.    [provider]  fluticasone (FLOVENT HFA) 44 MCG/ACT inhaler Inhale 2 puffs into the lungs 2 (two) times daily.    [provider]  folic acid (FOLVITE) 1 MG tablet Take 1 tablet (1 mg total) by mouth daily. 08/03/17   Jule Ser, DO  furosemide (LASIX) 20 MG tablet Take 1 tablet (20 mg total) by mouth daily. 03/17/18 06/15/18  Seiler, Amber K, NP  hydroxyurea (HYDREA) 500 MG capsule TAKE 2 CAPSULES BY MOUTH EVERY DAY. MAY TAKE WITH FOOD TO MINIMIZE GI SIDE EFFECTS 04/08/18   Lucious Groves, DO  predniSONE (DELTASONE) 20 MG tablet 2 tabs po daily x 4 days 04/10/18   Malvin Johns, MD  SUBOXONE 8-2 MG FILM Place 1 Film under the tongue daily.  08/21/15   [provider]  umeclidinium bromide (INCRUSE ELLIPTA) 62.5 MCG/INH AEPB Inhale 1 puff into the lungs daily. 01/24/18   [name], MD  warfarin (COUMADIN) 2.5 MG tablet Take one (1) tablet all days of the week--EXCEPT on Mondays, Wednesdays and Fridays take only one-half (1/2) tablet. 12/09/17   Jule Ser, DO    Family History Family History  Problem Relation Age of Onset  . Diabetes Mother   . Hypertension Mother   . Cirrhosis Father   . Alcohol abuse Father   . Colon cancer Neg Hx   . Rectal cancer Neg Hx   . Stomach cancer Neg Hx     Social History Social History   Tobacco Use  . Smoking status: Current Every Day Smoker    Packs/day: 0.10    Years: 50.00    Pack years: 5.00    Types: Cigarettes  . Smokeless tobacco: Never Used  Substance Use Topics  . Alcohol use: Yes     Alcohol/week: 89.0 standard drinks    Types: 12 Cans of beer, 77 Shots of liquor per week    Comment: 02/15/2017 "a pint of vodka/day" plus 12 pack of beer/week"  . Drug use: Yes    Types: "Crack" cocaine, Cocaine, Heroin    Comment: 02/15/2017 "last used crack 4-5 years ago; last used heroin a couple years ago; currently denies any drug use-- on suboxone.      Allergies   Patient has no known allergies.   Review of Systems Review of Systems  Constitutional: Negative for chills and fever.  HENT: Negative for ear pain and sore throat.   Eyes: Negative for pain and visual disturbance.  Respiratory: Positive for cough and shortness of breath.   Cardiovascular: Negative for chest pain and palpitations.  Gastrointestinal: Negative for abdominal pain and vomiting.  Genitourinary: Negative for dysuria and hematuria.  Musculoskeletal: Negative for  arthralgias and back pain.  Skin: Negative for color change and rash.  Neurological: Negative for seizures and syncope.  All other systems reviewed and are negative.    Physical Exam Updated Vital Signs BP (!) 105/58   Pulse (!) 52   Temp 97.7 F (36.5 C) (Axillary)   Resp 12   Ht 5\' 6"  (1.676 m)   Wt 98 kg   SpO2 100%   BMI 34.87 kg/m   Physical Exam  Constitutional: He appears well-developed and well-nourished. He appears distressed. He is not intubated.  HENT:  Head: Normocephalic and atraumatic.  Eyes: Conjunctivae are normal.  Neck: Neck supple.  Cardiovascular: Regular rhythm. Tachycardia present.  No murmur heard. Pulmonary/Chest: Accessory muscle usage present. Tachypnea noted. He is not intubated. He is in respiratory distress. He has decreased breath sounds in the right upper field, the right middle field, the right lower field, the left upper field, the left middle field and the left lower field. He has no wheezes. He has rhonchi in the right upper field, the right middle field, the right lower field, the left upper field,  the left middle field and the left lower field.  Abdominal: Soft. There is no tenderness.  Musculoskeletal: He exhibits no edema.  Neurological: He is alert.  Skin: Skin is warm. Capillary refill takes 2 to 3 seconds. He is diaphoretic.  Psychiatric: He has a normal mood and affect.  Nursing note and vitals reviewed.    ED Treatments / Results  Labs (all labs ordered are listed, but only abnormal results are displayed) Labs Reviewed  CBC - Abnormal; Notable for the following components:      Result Value   WBC 19.7 (*)    RBC 3.28 (*)    MCV 130.8 (*)    MCH 43.9 (*)    All other components within normal limits  BRAIN NATRIURETIC PEPTIDE - Abnormal; Notable for the following components:   B Natriuretic Peptide 441.4 (*)    All other components within normal limits  TROPONIN I - Abnormal; Notable for the following components:   Troponin I 0.06 (*)    All other components within normal limits  COMPREHENSIVE METABOLIC PANEL - Abnormal; Notable for the following components:   Potassium 3.4 (*)    Glucose, Bld 168 (*)    BUN 27 (*)    Creatinine, Ser 1.95 (*)    Calcium 8.6 (*)    Albumin 3.2 (*)    GFR calc non Af Amer 33 (*)    GFR calc Af Amer 38 (*)    All other components within normal limits  I-STAT ARTERIAL BLOOD GAS, ED - Abnormal; Notable for the following components:   pH, Arterial 7.326 (*)    pCO2 arterial 55.9 (*)    pO2, Arterial 160.0 (*)    Bicarbonate 29.3 (*)    All other components within normal limits    EKG EKG Interpretation  Date/Time:  Monday April 11 2018 18:46:22 EDT Ventricular Rate:  105 PR Interval:    QRS Duration: 98 QT Interval:  332 QTC Calculation: 439 R Axis:   22 Text Interpretation:  Sinus tachycardia Probable left atrial enlargement Borderline repol abnormality, lateral leads Baseline wander in lead(s) I II aVR Confirmed by Dorie Rank [phone]) on 04/11/2018 8:10:23 PM   Radiology Dg Chest 2 View  Result Date:  04/10/2018 CLINICAL DATA:  Pt reports being sob, mid chest pains and a dry cough x 2 days and has some swelling to his legs.  Reports having cardiac hx. EXAM: CHEST - 2 VIEW COMPARISON:  09/30/2017 FINDINGS: Cardiac silhouette is mildly enlarged. No mediastinal or hilar masses. No evidence of adenopathy. Clear lungs.  No pleural effusion or pneumothorax. Skeletal structures are demineralized but intact. IMPRESSION: No active cardiopulmonary disease. Electronically Signed   By: [name] M.D.   On: 04/10/2018 15:26   Dg Chest Port 1 View  Result Date: 04/11/2018 CLINICAL DATA:  Severe dyspnea on BiPAP.  History of COPD. EXAM: PORTABLE CHEST 1 VIEW COMPARISON:  04/10/2018 FINDINGS: Stable cardiomegaly with aortic atherosclerosis. Emphysematous hyperinflation of the lungs, upper lobe predominant with crowding of lower lobe interstitial lung markings. No alveolar consolidation, effusion or pneumothorax. Cardiac loop recording device projects over the left heart border. No acute nor suspicious osseous abnormalities. Prominent osteophyte off the inferomedial right humeral head and inferior glenoid consistent with osteoarthritis. IMPRESSION: Stable cardiomegaly with minimal aortic atherosclerosis. Pulmonary hyperinflation, upper lobe predominant with crowding of interstitial lung markings to the lower lobes. Electronically Signed   By: Ashley Royalty M.D.   On: 04/11/2018 19:47    Procedures Procedures (including critical care time)  Medications Ordered in ED Medications  0.9 %  sodium chloride infusion ( Intravenous Stopped 04/11/18 2205)  lactated ringers bolus 1,000 mL (has no administration in time range)     Initial Impression / Assessment and Plan / ED Course  I have reviewed the triage vital signs and the nursing notes.  Pertinent labs & imaging results that were available during my care of the patient were reviewed by me and considered in my medical decision making (see chart for details).      72yo M with PMH of  COPD, CHF, atrial fibrillation on Coumadin and hypertension who presents with respiratory distress.  History as above.  Patient presented in acute respiratory distress.  GCS 14.  Initially 80% on CPAP yet satting 100% on BiPAP once placed by RT.  History and physical most consistent with COPD exacerbation.  Physical exam not currently consistent with CHF exacerbation.  Patient placed on BiPAP and continuous nebs.  Labs and imaging performed.  Patient with market hyper patient consistent with COPD, no evidence of pneumonia or pulmonary edema.  No evidence of widened mediastinum or free air.  EKG reveals normal sinus rhythm, normal axis, normal intervals, no salicylate emergency depression.  Labs reveal initial ABG of 7.32/55/160.  Creatinine 1.95 up from baseline of 1.2.  BNP 441, elevated from yesterday's value of 194.  Initial troponin 0.06.  Troponin thought secondary to demand.  We will continue to trend.  BNP with mild elevation it still not values consistent with CHF exacerbation.  Patient with market improvement of symptoms with interventions.  Patient requires inpatient hospitalization for continuous breathing treatments and wean off of BiPAP.  Patient admitted to teaching service for further management.   Final Clinical Impressions(s) / ED Diagnoses   Final diagnoses:  COPD exacerbation ([place])  Acute respiratory failure with hypoxia [facility])    ED Discharge Orders    None       Keenan Bachelor, MD 04/12/18 [id number]    Dorie Rank, MD 04/15/18 [phone]

## 2018-04-11 NOTE — H&P (Addendum)
Date: 04/12/2018               Patient Name:  [name] MRN: [id number]  DOB: [birth year] Age / Sex: 72 y.o., male   PCP: [name], MD         Medical Service: Internal Medicine Teaching Service         Attending Physician: Dr. Evette Doffing, Mallie Mussel, *    First Contact: Dr. Annie Paras Pager: [phone]  Second Contact: Dr. Berline Lopes Pager: [phone]       After Hours (After 5p/  First Contact Pager: [phone]  weekends / holidays): Second Contact Pager: [phone]   Chief Complaint: SOB  History of Present Illness:  Mr. [name] is a 72yo male with PMH COPD, CHF, polycythemia vera, afib on coumadin, and HTN who presented via EMS to the [facility] ED with SOB and oxygen saturation of 70% after he was found unresponsive in his car by his neighbors. Patient was stabilized in the ED with bipap, duonebs, and solumedrol before IMTS was consulted and on our visit he was alert & oriented.  Patient states that for the last 2-3 days he has had an unproductive cough that has been worsening. He came into the ED yesterday for his cough and was prescribed steroids that have not helped. This morning he began to feel increasing SOB. He denies chest pain, nausea, recent fever, chills or any sick contacts. He states he has SOB at baseline and any exertion causes his symptoms to worsen. He states he has been compliant with his inhalers although he is unsure what he takes. He states he has noticed increased swelling in his legs recently. He smokes 2-3 cigarettes per day.    ED Course: Patient arrived with GCS of 13, sats were 100% on bipap HR 81. He was given solumedrol, duonebs, 2mg  Mg, and 2L LR bolus. Troponin was .06, BNP 450, and WBC 19.   Meds:   alubuteral 1-3 puffs q6h Amiodarone 200 mg .5 qd Coreg 3.125 qd Fluticasone 2 puffs bid Folic acid 1 mg qd Furosemide 20mg  qd Hydroxyurea 500 mg bid Prednisone 20 mg bid x 4days suboxone 2 mg film incruse ellipta 1 puff qd Warfarin 2.5 mg 1mg   except .5mg  MWF   Allergies: Allergies as of 04/11/2018  . (No Known Allergies)   Past Medical History:  Diagnosis Date  . Acute lower GI bleeding 02/15/2017  . Atrial fibrillation /flutter    hx of flutter ablation 2/12  . Cardiomyopathy    EF55% 11/14<<35%   . CHF (congestive heart failure) ([place])   . COPD (chronic obstructive pulmonary disease) ([place])   . GERD (gastroesophageal reflux disease)   . Gout   . Hepatitis    "think I had that once; a long time ago; from dirty needles I think" (12/14/2012)  . Hypertension   . Leukocytosis, unspecified 06/12/2013  . 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 Obesity   . Pneumonia X 1  . Primary polycythemia ([place]) 06/12/2013  . Renal insufficiency   . Substance abuse ([place])    alcohol; hx cocaine, heroin, crack use  . Syncope     Family History:  Family History  Problem Relation Age of Onset  . Diabetes Mother   . Hypertension Mother   . Cirrhosis Father   . Alcohol abuse Father   . Colon cancer Neg Hx   . Rectal cancer Neg Hx   . Stomach cancer Neg Hx      Social History:  Patient lives at home with his "better half" and two grandchildren. He is retired. He states he smokes 2-3 cigarettes/day and drinks a couple of drinks once per week. Chart review shows that he may have a higher alcohol intake than stated this evening.   Review of Systems: A complete ROS was negative except as per HPI.   Physical Exam: Blood pressure 110/87, pulse 71, temperature 97.7 F (36.5 C), temperature source Axillary, resp. rate 10, height 5\' 6"  (1.676 m), weight 98 kg, SpO2 98 %.  Constitution: NAD, bipap in place, pleasant HEENT: /AT, EOM intact, no scleral icterus Cardio: irregular rate & rhythm, no m/r/g, decreased pulses LE Respiratory: +wheezing on left, course breath sounds b/l, difficult to auscultate w/bipap Abdominal: NTTP, soft, non-distended MSK: strength symmetric, +1 pitting edema, sensation intact Neuro: A&Ox3,  cooperative Skin: LE mottling bilaterally without hair     EKG: personally reviewed my interpretation is sinus tachycardia and atrial fibrillation.  CXR: personally reviewed my interpretation is cardiomegaly, hyperinflation.   Assessment & Plan by Problem: Active Problems:   COPD exacerbation ([place])  72 yo male with PMH COPD, CHF, polycythemia vera, afib on coumadin, and HTN who presented via EMS to the [facility] ED with SOB and oxygen saturation of 70%.   COPD Exacerbation:  Diagnosed as Gold Stage 3 with PFTs in 2015. With his increased cough and SOB this is likely COPD exacerbation. Pneumonia is unlikely as CXR has been wnl and he has been afebile. His leukocytosis is likely secondary to starting steroids yesterday as WBC was 4.9 >> 19. Troponin was found to be elevated, this is likely related to his earlier hypoxia and increased demand as EKG showed no ST segment elevation, and he is asymptomatic for ACS.   - start azithromycin  - cont. Prednisone 40mg  - received solumedrol in ED  - duoneb q6h  - am BMP, CBC  - troponin .06 - trend troponins  - bipap prn   AKI on CKD: Baseline ~ 1.35. He received a 2L bolus of LR in the ED and was volume up on physical exam. As he is likely retaining fluid secondary to steroids and appears volume up on PE, and in light of his HFpEF, lasix is warranted.   - lasix 20 mg IV once, cont. Home dose lasix tomorrow.  - am BMP    HFpEF: Last ECHO 2015 with grade 1 diastolic dysfunction. Both his lasix and coreg were recently reduced due to recurrent hypotension and bradycardia.   - cont. Coreg 3.125 qd, lasix 20mg  qd after IV dose this evening  - monitor for signs of fluid overload  - daily weights    Atrial Fibrillation without RVR: On coumadin. Tachycardic on admission but this has resolved. He appears to have a history of intermittent bradycardia as well. Has loop in place.   - cont. Amiodarone - Coumadin per pharmacy   JAK2+ Polycythemia Vera:  cont. Hydroxyurea. Sees Dr. Alvy Bimler with oncology.    Hyperglycemia: no history of TIIDM, but he is receiving steroids. Last a1c 5.5 2016.  - SSI  - CBG qid  Unclear Alcohol Use: Patient states this evening he drinks socially once a week. Chart review shows mixed reports of stating he no longer drinks due to gout vs. Excessive alcohol use.   - CIWA without ativan   Previous Heroin Use: cont. Home suboxone.    Diet: advance as tolerated with bipap VTE: coumadin IVF: received 2L LR in ED Code: Full  Dispo: Admit patient to Observation  with expected length of stay less than 2 midnights.  SignedMarty Heck, DO 04/12/2018, 1:35 AM  Pager: [phone]

## 2018-04-12 ENCOUNTER — Encounter (HOSPITAL_COMMUNITY): Payer: Self-pay

## 2018-04-12 ENCOUNTER — Other Ambulatory Visit: Payer: Self-pay

## 2018-04-12 DIAGNOSIS — N179 Acute kidney failure, unspecified: Secondary | ICD-10-CM | POA: Diagnosis not present

## 2018-04-12 DIAGNOSIS — I4891 Unspecified atrial fibrillation: Secondary | ICD-10-CM

## 2018-04-12 DIAGNOSIS — Z7901 Long term (current) use of anticoagulants: Secondary | ICD-10-CM

## 2018-04-12 DIAGNOSIS — R778 Other specified abnormalities of plasma proteins: Secondary | ICD-10-CM | POA: Diagnosis present

## 2018-04-12 DIAGNOSIS — J441 Chronic obstructive pulmonary disease with (acute) exacerbation: Secondary | ICD-10-CM | POA: Diagnosis not present

## 2018-04-12 DIAGNOSIS — D45 Polycythemia vera: Secondary | ICD-10-CM

## 2018-04-12 DIAGNOSIS — F1010 Alcohol abuse, uncomplicated: Secondary | ICD-10-CM

## 2018-04-12 DIAGNOSIS — Z720 Tobacco use: Secondary | ICD-10-CM

## 2018-04-12 DIAGNOSIS — R739 Hyperglycemia, unspecified: Secondary | ICD-10-CM

## 2018-04-12 DIAGNOSIS — J9601 Acute respiratory failure with hypoxia: Secondary | ICD-10-CM | POA: Diagnosis not present

## 2018-04-12 DIAGNOSIS — I5030 Unspecified diastolic (congestive) heart failure: Secondary | ICD-10-CM

## 2018-04-12 DIAGNOSIS — F1110 Opioid abuse, uncomplicated: Secondary | ICD-10-CM

## 2018-04-12 DIAGNOSIS — R7989 Other specified abnormal findings of blood chemistry: Secondary | ICD-10-CM

## 2018-04-12 DIAGNOSIS — I110 Hypertensive heart disease with heart failure: Secondary | ICD-10-CM

## 2018-04-12 DIAGNOSIS — Z79899 Other long term (current) drug therapy: Secondary | ICD-10-CM

## 2018-04-12 LAB — BASIC METABOLIC PANEL
Anion gap: 14 (ref 5–15)
BUN: 29 mg/dL — ABNORMAL HIGH (ref 8–23)
CALCIUM: 8.7 mg/dL — AB (ref 8.9–10.3)
CHLORIDE: 100 mmol/L (ref 98–111)
CO2: 26 mmol/L (ref 22–32)
CREATININE: 1.74 mg/dL — AB (ref 0.61–1.24)
GFR calc Af Amer: 44 mL/min — ABNORMAL LOW (ref >60)
GFR calc non Af Amer: 38 mL/min — ABNORMAL LOW (ref >60)
GLUCOSE: 175 mg/dL — AB (ref 70–99)
Potassium: 3.5 mmol/L (ref 3.5–5.1)
Sodium: 140 mmol/L (ref 135–145)

## 2018-04-12 LAB — TROPONIN I
TROPONIN I: 0.1 ng/mL — AB (ref <0.03)
Troponin I: 0.08 ng/mL (ref <0.03)

## 2018-04-12 LAB — HIV ANTIBODY (ROUTINE TESTING W REFLEX): HIV Screen 4th Generation wRfx: NONREACTIVE

## 2018-04-12 LAB — GLUCOSE, CAPILLARY
GLUCOSE-CAPILLARY: 160 mg/dL — AB (ref 70–99)
GLUCOSE-CAPILLARY: 133 mg/dL — AB (ref 70–99)
GLUCOSE-CAPILLARY: 158 mg/dL — AB (ref 70–99)
Glucose-Capillary: 248 mg/dL — ABNORMAL HIGH (ref 70–99)

## 2018-04-12 LAB — MRSA PCR SCREENING: MRSA BY PCR: POSITIVE — AB

## 2018-04-12 LAB — PROTIME-INR
INR: 3.48
Prothrombin Time: 34.7 seconds — ABNORMAL HIGH (ref 11.4–15.2)

## 2018-04-12 MED ORDER — FUROSEMIDE 10 MG/ML IJ SOLN
20.0000 mg | Freq: Once | INTRAMUSCULAR | Status: AC
  Administered 2018-04-12: 20 mg via INTRAVENOUS
  Filled 2018-04-12: qty 2

## 2018-04-12 MED ORDER — IPRATROPIUM-ALBUTEROL 0.5-2.5 (3) MG/3ML IN SOLN
3.0000 mL | RESPIRATORY_TRACT | Status: DC
  Administered 2018-04-12 (×4): 3 mL via RESPIRATORY_TRACT
  Filled 2018-04-12 (×4): qty 3

## 2018-04-12 MED ORDER — FOLIC ACID 1 MG PO TABS
1.0000 mg | ORAL_TABLET | Freq: Every day | ORAL | Status: DC
  Administered 2018-04-12 – 2018-04-14 (×3): 1 mg via ORAL
  Filled 2018-04-12 (×3): qty 1

## 2018-04-12 MED ORDER — AMIODARONE HCL 200 MG PO TABS
100.0000 mg | ORAL_TABLET | Freq: Every day | ORAL | Status: DC
  Administered 2018-04-12 – 2018-04-14 (×3): 100 mg via ORAL
  Filled 2018-04-12 (×3): qty 1

## 2018-04-12 MED ORDER — AZITHROMYCIN 250 MG PO TABS
500.0000 mg | ORAL_TABLET | Freq: Every day | ORAL | Status: AC
  Administered 2018-04-12: 500 mg via ORAL
  Filled 2018-04-12: qty 2

## 2018-04-12 MED ORDER — IPRATROPIUM-ALBUTEROL 0.5-2.5 (3) MG/3ML IN SOLN
3.0000 mL | Freq: Four times a day (QID) | RESPIRATORY_TRACT | Status: DC
  Administered 2018-04-12 (×2): 3 mL via RESPIRATORY_TRACT
  Filled 2018-04-12 (×2): qty 3

## 2018-04-12 MED ORDER — BUPRENORPHINE HCL-NALOXONE HCL 8-2 MG SL SUBL
1.0000 | SUBLINGUAL_TABLET | Freq: Two times a day (BID) | SUBLINGUAL | Status: DC
  Administered 2018-04-12 – 2018-04-14 (×5): 1 via SUBLINGUAL
  Filled 2018-04-12 (×5): qty 1

## 2018-04-12 MED ORDER — PNEUMOCOCCAL VAC POLYVALENT 25 MCG/0.5ML IJ INJ
0.5000 mL | INJECTION | INTRAMUSCULAR | Status: AC
  Administered 2018-04-13: 0.5 mL via INTRAMUSCULAR
  Filled 2018-04-12: qty 0.5

## 2018-04-12 MED ORDER — UMECLIDINIUM BROMIDE 62.5 MCG/INH IN AEPB: 1.0000 | INHALATION_SPRAY | Freq: Every day | RESPIRATORY_TRACT | Status: DC

## 2018-04-12 MED ORDER — ACETAMINOPHEN 325 MG PO TABS: 650.0000 mg | ORAL_TABLET | Freq: Four times a day (QID) | ORAL | Status: DC | PRN

## 2018-04-12 MED ORDER — PREDNISONE 20 MG PO TABS
40.0000 mg | ORAL_TABLET | Freq: Every day | ORAL | Status: DC
  Administered 2018-04-12 – 2018-04-14 (×3): 40 mg via ORAL
  Filled 2018-04-12 (×3): qty 2

## 2018-04-12 MED ORDER — AZITHROMYCIN 500 MG PO TABS
250.0000 mg | ORAL_TABLET | Freq: Every day | ORAL | Status: DC
  Administered 2018-04-13 – 2018-04-14 (×2): 250 mg via ORAL
  Filled 2018-04-12 (×2): qty 1

## 2018-04-12 MED ORDER — INSULIN ASPART 100 UNIT/ML ~~LOC~~ SOLN
0.0000 [IU] | Freq: Three times a day (TID) | SUBCUTANEOUS | Status: DC
  Administered 2018-04-12: 5 [IU] via SUBCUTANEOUS
  Administered 2018-04-12: 1 [IU] via SUBCUTANEOUS
  Administered 2018-04-12 – 2018-04-13 (×3): 2 [IU] via SUBCUTANEOUS
  Administered 2018-04-14: 1 [IU] via SUBCUTANEOUS

## 2018-04-12 MED ORDER — MUPIROCIN 2 % EX OINT
TOPICAL_OINTMENT | Freq: Two times a day (BID) | CUTANEOUS | Status: DC
  Administered 2018-04-12 – 2018-04-14 (×5): via NASAL
  Filled 2018-04-12 (×3): qty 22

## 2018-04-12 MED ORDER — SODIUM CHLORIDE 0.9% FLUSH
3.0000 mL | Freq: Two times a day (BID) | INTRAVENOUS | Status: DC
  Administered 2018-04-12 – 2018-04-14 (×6): 3 mL via INTRAVENOUS

## 2018-04-12 MED ORDER — CARVEDILOL 3.125 MG PO TABS
3.1250 mg | ORAL_TABLET | Freq: Every day | ORAL | Status: DC
  Administered 2018-04-12 – 2018-04-14 (×3): 3.125 mg via ORAL
  Filled 2018-04-12 (×3): qty 1

## 2018-04-12 MED ORDER — HYDROXYUREA 500 MG PO CAPS
1000.0000 mg | ORAL_CAPSULE | Freq: Every day | ORAL | Status: DC
  Administered 2018-04-12 – 2018-04-14 (×3): 1000 mg via ORAL
  Filled 2018-04-12 (×3): qty 2

## 2018-04-12 NOTE — ED Notes (Signed)
admitting Provider at bedside. 

## 2018-04-12 NOTE — Progress Notes (Signed)
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

## 2018-04-12 NOTE — Plan of Care (Signed)
  Problem: Education: Goal: Knowledge of General Education information will improve Description Including pain rating scale, medication(s)/side effects and non-pharmacologic comfort measures Outcome: Progressing   Problem: Health Behavior/Discharge Planning: Goal: Ability to manage health-related needs will improve Outcome: Progressing   Problem: Clinical Measurements: Goal: Ability to maintain clinical measurements within normal limits will improve Outcome: Progressing Goal: Will remain free from infection Outcome: Progressing Goal: Diagnostic test results will improve Outcome: Progressing Goal: Respiratory complications will improve Outcome: Progressing Goal: Cardiovascular complication will be avoided Outcome: Progressing   Problem: Activity: Goal: Risk for activity intolerance will decrease Outcome: Progressing   Problem: Nutrition: Goal: Adequate nutrition will be maintained Outcome: Progressing   Problem: Coping: Goal: Level of anxiety will decrease Outcome: Progressing   Problem: Elimination: Goal: Will not experience complications related to bowel motility Outcome: Progressing Goal: Will not experience complications related to urinary retention Outcome: Progressing   Problem: Pain Managment: Goal: General experience of comfort will improve Outcome: Progressing   Problem: Safety: Goal: Ability to remain free from injury will improve Outcome: Progressing   Problem: Skin Integrity: Goal: Risk for impaired skin integrity will decrease Outcome: Progressing  New admission to unit 0530, alert and oriented x , denies pain with assess.  Patient able to express needs, ambulates with assist. Patient is a progressive care patient.  Vital signs stable on arrival.  Patient remains alert and oriented  with no respiratory distress.  Denies feeling faint or impeding syncope.  Will continue to monitor.

## 2018-04-12 NOTE — ED Notes (Signed)
Date and time results received: 04/12/18 3:24 AM  Test: Trop Critical Value: 0.10  Name of Provider Notified: Frederico Hamman MD internal medicine  Orders Received? Or Actions Taken?: none
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
## 2018-04-12 NOTE — Progress Notes (Signed)
MD notified that patients blood pressure was 91/59 and asked whether I should hold the carvedilol. MD asked me to recheck the pressure. Blood pressure was 100/49. MD said to give carvedilol.

## 2018-04-12 NOTE — Progress Notes (Signed)
Pt doing well on Bipap removed to give trial while taking nebulizer treatment.  Patient tolerated well placed on 3 lpm N/C pt rr is 15 SPO2 is 98
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
## 2018-04-13 ENCOUNTER — Other Ambulatory Visit: Payer: Self-pay | Admitting: Internal Medicine

## 2018-04-13 ENCOUNTER — Telehealth: Payer: Self-pay

## 2018-04-13 DIAGNOSIS — R0603 Acute respiratory distress: Secondary | ICD-10-CM

## 2018-04-13 DIAGNOSIS — R791 Abnormal coagulation profile: Secondary | ICD-10-CM

## 2018-04-13 DIAGNOSIS — I429 Cardiomyopathy, unspecified: Secondary | ICD-10-CM | POA: Diagnosis present

## 2018-04-13 DIAGNOSIS — J441 Chronic obstructive pulmonary disease with (acute) exacerbation: Secondary | ICD-10-CM | POA: Diagnosis present

## 2018-04-13 DIAGNOSIS — Z7951 Long term (current) use of inhaled steroids: Secondary | ICD-10-CM | POA: Diagnosis not present

## 2018-04-13 DIAGNOSIS — Z7901 Long term (current) use of anticoagulants: Secondary | ICD-10-CM | POA: Diagnosis not present

## 2018-04-13 DIAGNOSIS — J9601 Acute respiratory failure with hypoxia: Secondary | ICD-10-CM | POA: Diagnosis present

## 2018-04-13 DIAGNOSIS — T380X5A Adverse effect of glucocorticoids and synthetic analogues, initial encounter: Secondary | ICD-10-CM | POA: Diagnosis present

## 2018-04-13 DIAGNOSIS — N183 Chronic kidney disease, stage 3 (moderate): Secondary | ICD-10-CM | POA: Diagnosis present

## 2018-04-13 DIAGNOSIS — I4891 Unspecified atrial fibrillation: Secondary | ICD-10-CM | POA: Diagnosis present

## 2018-04-13 DIAGNOSIS — I130 Hypertensive heart and chronic kidney disease with heart failure and stage 1 through stage 4 chronic kidney disease, or unspecified chronic kidney disease: Secondary | ICD-10-CM | POA: Diagnosis present

## 2018-04-13 DIAGNOSIS — K219 Gastro-esophageal reflux disease without esophagitis: Secondary | ICD-10-CM | POA: Diagnosis present

## 2018-04-13 DIAGNOSIS — I959 Hypotension, unspecified: Secondary | ICD-10-CM | POA: Diagnosis present

## 2018-04-13 DIAGNOSIS — Z79899 Other long term (current) drug therapy: Secondary | ICD-10-CM | POA: Diagnosis not present

## 2018-04-13 DIAGNOSIS — R739 Hyperglycemia, unspecified: Secondary | ICD-10-CM | POA: Diagnosis present

## 2018-04-13 DIAGNOSIS — R7989 Other specified abnormal findings of blood chemistry: Secondary | ICD-10-CM | POA: Diagnosis present

## 2018-04-13 DIAGNOSIS — D72829 Elevated white blood cell count, unspecified: Secondary | ICD-10-CM | POA: Diagnosis present

## 2018-04-13 DIAGNOSIS — N179 Acute kidney failure, unspecified: Secondary | ICD-10-CM | POA: Diagnosis present

## 2018-04-13 DIAGNOSIS — Z23 Encounter for immunization: Secondary | ICD-10-CM | POA: Diagnosis not present

## 2018-04-13 DIAGNOSIS — I5033 Acute on chronic diastolic (congestive) heart failure: Secondary | ICD-10-CM | POA: Diagnosis present

## 2018-04-13 DIAGNOSIS — D45 Polycythemia vera: Secondary | ICD-10-CM | POA: Diagnosis present

## 2018-04-13 DIAGNOSIS — F17210 Nicotine dependence, cigarettes, uncomplicated: Secondary | ICD-10-CM | POA: Diagnosis present

## 2018-04-13 DIAGNOSIS — I248 Other forms of acute ischemic heart disease: Secondary | ICD-10-CM | POA: Diagnosis present

## 2018-04-13 LAB — GLUCOSE, CAPILLARY
GLUCOSE-CAPILLARY: 161 mg/dL — AB (ref 70–99)
GLUCOSE-CAPILLARY: 166 mg/dL — AB (ref 70–99)
Glucose-Capillary: 151 mg/dL — ABNORMAL HIGH (ref 70–99)

## 2018-04-13 LAB — BASIC METABOLIC PANEL
Anion gap: 12 (ref 5–15)
BUN: 36 mg/dL — ABNORMAL HIGH (ref 8–23)
CHLORIDE: 101 mmol/L (ref 98–111)
CO2: 25 mmol/L (ref 22–32)
CREATININE: 1.55 mg/dL — AB (ref 0.61–1.24)
Calcium: 8.4 mg/dL — ABNORMAL LOW (ref 8.9–10.3)
GFR calc Af Amer: 51 mL/min — ABNORMAL LOW (ref >60)
GFR, EST NON AFRICAN AMERICAN: 44 mL/min — AB (ref >60)
GLUCOSE: 135 mg/dL — AB (ref 70–99)
Potassium: 3.5 mmol/L (ref 3.5–5.1)
SODIUM: 138 mmol/L (ref 135–145)

## 2018-04-13 LAB — HEMOGLOBIN A1C
HEMOGLOBIN A1C: 5.4 % (ref 4.8–5.6)
Mean Plasma Glucose: 108.28 mg/dL

## 2018-04-13 LAB — PROTIME-INR
INR: 4.36 — AB
Prothrombin Time: 41.4 seconds — ABNORMAL HIGH (ref 11.4–15.2)

## 2018-04-13 MED ORDER — IPRATROPIUM-ALBUTEROL 0.5-2.5 (3) MG/3ML IN SOLN
3.0000 mL | Freq: Three times a day (TID) | RESPIRATORY_TRACT | Status: DC
  Administered 2018-04-13 – 2018-04-14 (×3): 3 mL via RESPIRATORY_TRACT
  Filled 2018-04-13 (×4): qty 3

## 2018-04-13 MED ORDER — CARVEDILOL 3.125 MG PO TABS: 3.1250 mg | ORAL_TABLET | Freq: Every day | ORAL | 1 refills | Status: DC

## 2018-04-13 MED ORDER — AZITHROMYCIN 250 MG PO TABS: 250.0000 mg | ORAL_TABLET | Freq: Every day | ORAL | 0 refills | Status: DC

## 2018-04-13 NOTE — Care Management Obs Status (Signed)
[place] NOTIFICATION   Patient Details  Name: Durant Scibilia MRN: [id number] Date of Birth: 1946-01-04   Medicare Observation Status Notification Given:  Yes    Sharin Mons, RN 04/13/2018, 1:36 PM

## 2018-04-13 NOTE — Progress Notes (Signed)
ANTICOAGULATION CONSULT NOTE  Pharmacy Consult for coumadin Indication: atrial fibrillation  No Known Allergies  Patient Measurements: Height: 5\' 6"  (167.6 cm) Weight: 210 lb 5.1 oz (95.4 kg) IBW/kg (Calculated) : 63.8   Vital Signs: Temp: 98.2 F (36.8 C) (09/25 0552) Temp Source: Oral (09/25 0552) BP: 119/64 (09/25 0552) Pulse Rate: 65 (09/25 0552)  Labs: Recent Labs    04/10/18 1456 04/10/18 1637 04/11/18 1910 04/11/18 2019 04/12/18 0137 04/12/18 0138 04/12/18 0701 04/13/18 0456  HGB 13.6  --  14.4  --   --   --   --   --   HCT 40.0  --  42.9  --   --   --   --   --   PLT 104*  --  215  --   --   --   --   --   LABPROT  --  30.1*  --   --   --  34.7*  --  41.4*  INR  --  2.91  --   --   --  3.48  --  4.36*  CREATININE 1.23  --   --  1.95*  --   --  1.74* 1.55*  TROPONINI  --   --   --  0.06* 0.10*  --  0.08*  --     Estimated Creatinine Clearance: 47.9 mL/min (A) (by C-G formula based on SCr of 1.55 mg/dL (H)).    Medications:  Coumadin 2.5 mg on TuThSS, 1.25 mg MWF, last dose unknown  Assessment: 72 yo man to continue coumadin for afib.  INR 4.36 Goal of Therapy:  INR 2-3 Monitor platelets by anticoagulation protocol: Yes   Plan:  Hold warfarin today Daily INR  Thank you Anette Guarneri, PharmD [phone]  04/13/2018,1:39 PM
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
## 2018-04-13 NOTE — Progress Notes (Signed)
SATURATION QUALIFICATIONS: (This note is used to comply with regulatory documentation for home oxygen)  Patient Saturations on Room Air at Rest = 96%  Patient Saturations on Room Air while Ambulating = 95%  

## 2018-04-13 NOTE — Progress Notes (Addendum)
Subjective: No overnight events. [name] reports that he still feels mildly short of breath when walks around and continues to have cough. However, he feels overall improved. His INR is elevated, but he has had no bleeding and has no new symptoms. He follows with Dr. Elie Confer in coumadin clinic.   [name] appeared appropriate for discharge, however I was notified by nursing later in the day that the patient was experiencing another episode of difficulty breathing and coughing while walking around preparing for discharge. He was not wearing a pulse ox at that time. He received a breathing treatment. I evaluated the patient at bedside and he appeared comfortable without changes in his lung exam, however this was immediately after a breathing treatment.  Objective:  Vital signs in last 24 hours: Vitals:   04/12/18 2027 04/12/18 2108 04/12/18 2333 04/13/18 0552  BP:  117/63  119/64  Pulse:  (!) 59  65  Resp:  18  20  Temp:  97.9 F (36.6 C)  98.2 F (36.8 C)  TempSrc:  Oral  Oral  SpO2: 96% 95% 98% 95%  Weight:      Height:       Physical Exam  Constitutional: He is oriented to person, place, and time. He appears well-developed and well-nourished. No distress.  Cardiovascular: Normal rate and regular rhythm. No murmurs, rubs, or gallops. Pulmonary/Chest: Effort normal. Mild diffuse expiratory wheezing. Lungs are much clearer than yesterday's exam.  Musculoskeletal: 1+ pitting edema bilaterally.  Neurological: He is alert and oriented to person, place, and time.  Skin: Skin is warm and dry.   Assessment/Plan:  Principal Problem:   COPD exacerbation ([facility]) Active Problems:   Atrial fibrillation ([facility])   Heart failure with preserved left ventricular function (HFpEF) ([facility])   Elevated troponin  72 yo male witha medical history of COPD, CHF, polycythemia vera, afib on coumadin, and HTN who presented with dyspnea, increased cough, and desaturations to 70%. He initially required BiPAP,  but is now oxygenating well on room air.   COPD Exacerbation: - Ambulatory sats: The patient did not de-sat during ambulation with nursing staff today. His oxygen level stayed at 95-96% and he had no symptoms of dyspnea or dizziness. - He later had an episode of shortness of breath and coughing while walking that required an immediate breathing treatment. Although his lungs sound improved on exam today, he still has expiratory wheezes. Will continue supportive care with breathing treatments, antibiotics, and steroids for his COPD exacerbation. Will re-trial ambulatory sats tomorrow and re-consider discharge if he appears clinically improved and without episodes of dyspnea.  Plan - continue azithromycin for a total course of 5 days (to end 9/28) - continue prednisone 40mg  - duoneb q4hrs when awake  Elevated INR - Pt takes Coumadin for afib. INR elevated today to 4.36 without signs or symptoms of bleeding. Most likely elevated due to azithromycin, prednisone, and diet changes in the hospital setting. Pt discussed with pharmacy and Dr. Elie Confer. When discharge planning today, he was scheduled for an INR re-check with Dr. Elie Confer on 9/27. If he is not discharged tomorrow, this appointment will be cancelled.  Plan - Hold coumadin - Repeat INR tomorrow  AKI on CKD: - Kidney function has improved since admission. Cr down from 1.95 on admission to 1.55 (baseline 1.2-1.5).  Plan - AM BMP  Hyperglycemia - Hyperglycemia to 150s in the setting of steroid use. No history of diabetes. A1c yesterday was 5.4 Plan - SSI  - cbg qid  Dispo: Anticipated discharge in approximately 1 day.  Dorrell, Andree Elk, MD 04/13/2018, 6:33 AM Pager: [phone]

## 2018-04-13 NOTE — Progress Notes (Signed)
Internal Medicine Attending:   I saw and examined the patient. I reviewed the resident's note and I agree with the resident's findings and plan as documented in the resident's note.  Principal Problem:   COPD exacerbation ([place]) Active Problems:   Atrial fibrillation ([place])   Heart failure with preserved left ventricular function (HFpEF) (H[facility])   Elevated troponin  Stable this morning with improved lung sounds, no ambulatory hypoxia, but had another episode of respiratory distress later in the afternoon. Plan is to continue with prednisone, azithro, and inhaled bronchodilators q4 hours while awake. Volume status looks reasonable today. Coumadin adjusted for elevated INR due to pred/azithro interaction.  Lalla Brothers, MD

## 2018-04-13 NOTE — Progress Notes (Signed)
CRITICAL VALUE ALERT  Critical Value:  INR 4.36  Date & Time Notified:  04/13/18 0641  Provider Notified:  [facility], Dr. Annie Paras  Orders Received/Actions taken:  Page acknowledged, no new orders received.

## 2018-04-13 NOTE — Telephone Encounter (Signed)
Hospital TOC per Dr Annie Paras, discharge 04/13/2018, appt 04/15/2018.

## 2018-04-13 NOTE — Discharge Summary (Signed)
Name: [name] MRN: [id number] DOB: 11-23-1945 72 y.o. PCP: [name], MD  Date of Admission: 04/11/2018  6:36 PM Date of Discharge: 04/14/2018 Attending Physician: Axel Filler, *  Discharge Diagnosis: 1. COPD Exacerbation 2. Mild CHF Exacerbation 3. Elevated INR 4. AKI 5. Hyperglycemia 2/2 prednisone  Discharge Medications: Allergies as of 04/14/2018   No Known Allergies     Medication List    STOP taking these medications   umeclidinium bromide 62.5 MCG/INH Aepb Commonly known as:  INCRUSE ELLIPTA   warfarin 2.5 MG tablet Commonly known as:  COUMADIN     TAKE these medications   albuterol 108 (90 Base) MCG/ACT inhaler Commonly known as:  PROVENTIL HFA;VENTOLIN HFA Inhale 1-2 puffs into the lungs every 6 (six) hours as needed for wheezing or shortness of breath. PLACE ON HOLD UNTIL PATIENT REQUESTS TO FILL What changed:  additional instructions   amiodarone 200 MG tablet Commonly known as:  PACERONE Take 0.5 tablets (100 mg total) by mouth daily.   azithromycin 250 MG tablet Commonly known as:  ZITHROMAX Take 1 tablet (250 mg total) by mouth daily for 2 days.   carvedilol 3.125 MG tablet Commonly known as:  COREG TAKE 1 TABLET(3.125 MG) BY MOUTH DAILY What changed:    medication strength  See the new instructions.   fluticasone 44 MCG/ACT inhaler Commonly known as:  FLOVENT HFA Inhale 2 puffs into the lungs 2 (two) times daily.   folic acid 1 MG tablet Commonly known as:  FOLVITE Take 1 tablet (1 mg total) by mouth daily.   furosemide 20 MG tablet Commonly known as:  LASIX Take 1 tablet (20 mg total) by mouth daily.   hydroxyurea 500 MG capsule Commonly known as:  HYDREA TAKE 2 CAPSULES BY MOUTH EVERY DAY. MAY TAKE WITH FOOD TO MINIMIZE GI SIDE EFFECTS What changed:    how much to take  how to take this  when to take this  additional instructions   ipratropium-albuterol 0.5-2.5 (3) MG/3ML Soln Commonly  known as:  DUONEB Take 3 mLs by nebulization every 4 (four) hours as needed. Use for severe shortness of breath.   predniSONE 10 MG tablet Commonly known as:  DELTASONE Take 2 tablets (20mg ) for three days (starting 9/27, 9/28, 9/29). Then take one tablet (10mg ) for three days (9/30, 10/1, 10/2). What changed:    medication strength  additional instructions   SUBOXONE 8-2 MG Film Generic drug:  Buprenorphine HCl-Naloxone HCl Place 1 Film under the tongue daily.   umeclidinium-vilanterol 62.5-25 MCG/INH Aepb Commonly known as:  ANORO ELLIPTA Inhale 1 puff into the lungs daily.            Durable Medical Equipment  (From admission, onward)         Start     Ordered   04/14/18 1138  For home use only DME Nebulizer machine  Once    Question:  Patient needs a nebulizer to treat with the following condition  Answer:  COPD (chronic obstructive pulmonary disease) ([place])   04/14/18 1137          Disposition and follow-up:   [name] was discharged from [facility] in Good condition.  At the hospital follow up visit please address:  1.  COPD Exacerbation: Please consider outpatient PFTs to evaluate disease progression. During his hospitalization, he was switched from a SABA + LAMA regimen to SABA + LAMA + LABA + home nebulizer with PRN duonebs.  Elevated INR: INR elevated to 3.48 the day of discharge. Held coumadin dose. Will f/u in coumadin clinic with Dr. Elie Confer tomorrow 9/27 for repeat INR and re-dosing instructions.  2.  Labs / imaging needed at time of follow-up: Repeat INR  3.  Pending labs/ test needing follow-up: None  Follow-up Appointments: Follow-up Information    [name], MD. Schedule an appointment as soon as possible for a visit in 1 week(s).   Specialty:  Internal Medicine Contact information: [address] [address] [place] [facility] Course by problem list: 1. COPD  Exacerbation: [name] is a 72 yo male witha medical history of COPD, CHF, polycythemia vera, afib on coumadin, and HTN who presented with dyspnea, increased cough, and desaturations to 70% after a possible syncopal event. He initially required BiPAP, but quickly weaned down to room air. He had one episode of dyspnea while ambulating during the hospitalization that was resolved with immediate breathing treatment. His syncopal/presyncopal events are most likely due to hypoxia following a coughing fit in the setting of COPD exacerbation. He was treated with regular duonebs, azithromycin, and prednisone. At the time of discharge, he demonstrated excellent ambulatory oxygenation without symptoms of dyspnea. His lung exam improved since admission, however he continued to have bilateral expiratory wheezes. His home COPD regimen was changed from SABA + LAMA to SABA + LAMA + LABA + home nebulizer with duonebs PRN for severe dyspnea. He was discharged with azithromycin to complete a 5-day course of antibiotics. Because of his continued wheezing, he was also discharged home with a prednisone taper (20mg  for 3 days; 10 mg for 3 days). He was advised to f/u with his PCP for possible outpatient PFTs in the setting of progressive COPD. He was also advised to quit smoking.   2. Mild HFpEF exacerbation: [name] appeared mildly volume up upon admission with a weight gain of 9lbs since July He was given one dose of 20 mg IV lasix with good urine output and a 6lb decrease. Weight at discharge was 210lbs. His home lasix regimen of 20 mg QD was resumed. He appeared euvolemic at the time of discharge.  3. AKI: Cr elevated to 1.95 on admission most likely prerenal in the setting of volume overload. AKI resolved to 1.26 at time of discharge after diuretic therapy.  4. Elevated INR: [name] is on coumadin for afib. INR elevated to 4.36 on 9/25 without signs or symptoms of bleeding. Most likely elevated due to azithromycin,  prednisone, and diet changes in the hospital setting. Coumadin was held. INR was 3.48 on 9/26. Coumadin was again held. Coumadin clinic f/u was arranged for [name] on 9/27 with Dr. Elie Confer for repeat INR and re-dosing instructions.   5. Hyperglycemia 2/2 prednisone: [name] has no history of DM and A1c during hospitalization was 5.4. His sugars were mildly elevated and was on sliding scale insulin during his hospitalization.  Discharge Vitals:   BP 125/64 (BP Location: Right Arm)   Pulse 72   Temp 97.7 F (36.5 C)   Resp 18   Ht 5\' 6"  (1.676 m)   Wt 95.4 kg   SpO2 100%   BMI 33.95 kg/m   Pertinent Labs, Studies, and Procedures:  CBC Latest Ref Rng & Units 04/11/2018 04/10/2018 01/18/2018  WBC 4.0 - 10.5 K/uL 19.7(H) 4.9 6.3  Hemoglobin 13.0 - 17.0 g/dL 14.4 13.6 13.0  Hematocrit 39.0 - 52.0 % 42.9 40.0 37.2(L)  Platelets 150 - 400 K/uL 215 104(L) 179   CMP Latest Ref Rng & Units 04/14/2018 04/13/2018 04/12/2018  Glucose 70 - 99 mg/dL 111(H) 135(H) 175(H)  BUN 8 - 23 mg/dL 27(H) 36(H) 29(H)  Creatinine 0.61 - 1.24 mg/dL 1.26(H) 1.55(H) 1.74(H)  Sodium 135 - 145 mmol/L 140 138 140  Potassium 3.5 - 5.1 mmol/L 3.6 3.5 3.5  Chloride 98 - 111 mmol/L 103 101 100  CO2 22 - 32 mmol/L 31 25 26   Calcium 8.9 - 10.3 mg/dL 8.4(L) 8.4(L) 8.7(L)  Total Protein 6.5 - 8.1 g/dL - - -  Total Bilirubin 0.3 - 1.2 mg/dL - - -  Alkaline Phos 38 - 126 U/L - - -  AST 15 - 41 U/L - - -  ALT 0 - 44 U/L - - -   CXR Stable cardiomegaly with minimal aortic atherosclerosis. Pulmonary hyperinflation, upper lobe predominant with crowding of interstitial lung markings to the lower lobes.  Discharge Instructions: Discharge Instructions    Discharge instructions   Complete by:  As directed    It was a pleasure taking care of you, [name]!  1. You were hospitalized for an exacerbation of your COPD. Your oxygenation levels were normal both at rest and with walking, so you do not require supplemental  oxygen at home.   2. You should follow-up with your PCP in 1-2 weeks to discuss your symptoms. You may need pulmonary function testing to evaluate your COPD.   3. We have switched your inhaler from umeclidinium to umeclidinium + vilanterol. Take 1 inhalation of this once per day.   4. We will also work to get you a nebulizer at home. You can use the nebulizer to take duo-neb breathing treatments when you have severe shortness of breath.   5. Continue taking prednisone for 6 more days. Take 20 mg for three days starting tomorrow followed by 10 mg for 3 days.   6. Please continue taking your antibiotic azithromycin 250mg  for two days after leaving the hospital.  7. Please follow up in the internal medicine clinic (located on the ground floor underneath the emergency department) this Friday 9/24. You NEED to go to this appointment to get your INR checked. Until you visit with them, DO NOT take your Coumadin at home. I spoke with Dr. Elie Confer who can see you at 8:45am Friday morning. Please go to the clinic at that time. They will help you resume your Coumadin.  8. Please speak with a doctor sooner if you have any bleeding, new pain, or worsening shortness of breath. Your cough may take days to weeks to completely resolve.   Feel free to call our clinic at [phone] if you have any questions!  Thanks, Dr. Annie Paras   Increase activity slowly   Complete by:  As directed       Signed: Dorrell, Andree Elk, MD 04/14/2018, 1:55 PM   Pager: [phone]

## 2018-04-14 ENCOUNTER — Other Ambulatory Visit: Payer: Self-pay | Admitting: Internal Medicine

## 2018-04-14 LAB — BASIC METABOLIC PANEL
ANION GAP: 6 (ref 5–15)
BUN: 27 mg/dL — ABNORMAL HIGH (ref 8–23)
CHLORIDE: 103 mmol/L (ref 98–111)
CO2: 31 mmol/L (ref 22–32)
Calcium: 8.4 mg/dL — ABNORMAL LOW (ref 8.9–10.3)
Creatinine, Ser: 1.26 mg/dL — ABNORMAL HIGH (ref 0.61–1.24)
GFR calc non Af Amer: 56 mL/min — ABNORMAL LOW (ref >60)
Glucose, Bld: 111 mg/dL — ABNORMAL HIGH (ref 70–99)
Potassium: 3.6 mmol/L (ref 3.5–5.1)
Sodium: 140 mmol/L (ref 135–145)

## 2018-04-14 LAB — GLUCOSE, CAPILLARY
Glucose-Capillary: 97 mg/dL (ref 70–99)
Glucose-Capillary: 147 mg/dL — ABNORMAL HIGH (ref 70–99)

## 2018-04-14 LAB — PROTIME-INR
INR: 3.48
Prothrombin Time: 34.7 seconds — ABNORMAL HIGH (ref 11.4–15.2)

## 2018-04-14 MED ORDER — IPRATROPIUM-ALBUTEROL 0.5-2.5 (3) MG/3ML IN SOLN: 3.0000 mL | RESPIRATORY_TRACT | 3 refills | Status: DC | PRN

## 2018-04-14 MED ORDER — AZITHROMYCIN 250 MG PO TABS: 250.0000 mg | ORAL_TABLET | Freq: Every day | ORAL | 0 refills | Status: AC

## 2018-04-14 MED ORDER — IPRATROPIUM-ALBUTEROL 0.5-2.5 (3) MG/3ML IN SOLN
3.0000 mL | Freq: Once | RESPIRATORY_TRACT | Status: AC
  Administered 2018-04-14: 3 mL via RESPIRATORY_TRACT
  Filled 2018-04-14: qty 3

## 2018-04-14 MED ORDER — UMECLIDINIUM-VILANTEROL 62.5-25 MCG/INH IN AEPB: 1.0000 | INHALATION_SPRAY | Freq: Every day | RESPIRATORY_TRACT | 3 refills | Status: DC

## 2018-04-14 MED ORDER — PREDNISONE 10 MG PO TABS: ORAL_TABLET | ORAL | 0 refills | Status: DC

## 2018-04-14 MED ORDER — WARFARIN 0.5 MG HALF TABLET
0.5000 mg | ORAL_TABLET | Freq: Once | ORAL | Status: DC
  Filled 2018-04-14: qty 1

## 2018-04-14 MED ORDER — WARFARIN - PHARMACIST DOSING INPATIENT: Freq: Every day | Status: DC

## 2018-04-14 NOTE — Plan of Care (Signed)

## 2018-04-14 NOTE — Progress Notes (Signed)
SATURATION QUALIFICATIONS: (This note is used to comply with regulatory documentation for home oxygen)  Patient Saturations on Room Air at Rest = 97%  Patient Saturations on Room Air while Ambulating = 92%  Patient Saturations on N/A Liters of oxygen while Ambulating = N/A %  Please briefly explain why patient needs home oxygen:

## 2018-04-14 NOTE — Progress Notes (Signed)
   Subjective: No overnight events. [name] reports that he has not had any more episodes of dyspnea since yesterday. He has been ambulating well without dyspnea. He is unsure what precipitated that event yesterday. He demonstrated good technique with his albuterol inhaler at bedside. He does not have a nebulizer at home. He feels ready to go home today.  Objective:  Vital signs in last 24 hours: Vitals:   04/13/18 1747 04/13/18 1908 04/13/18 2335 04/14/18 0546  BP: (!) 127/92  118/68 (!) 157/66  Pulse: 92  97 95  Resp: 18   18  Temp: 98.2 F (36.8 C)  97.9 F (36.6 C) 97.7 F (36.5 C)  TempSrc: Oral     SpO2: 94% 95% 98% 97%  Weight:      Height:       Physical Exam Constitutional: He appearswell-developedand well-nourished.No distress.  Pulmonary/Chest:Effort normal. Diffuse expiratory wheezing. Neurological: He isalertand oriented to person, place, and time.  Skin: Skin iswarmand dry.No rashes.  Assessment/Plan:  Principal Problem:   COPD exacerbation (HCC) Active Problems:   Atrial fibrillation (HCC)   Heart failure with preserved left ventricular function (HFpEF) (HCC)   Elevated troponin  72 yo male witha medical history ofCOPD, CHF, polycythemia vera, afib on coumadin, and HTN whopresented with dyspnea, increased cough, and desaturations to 70%. He initially required BiPAP, but is now oxygenating well on room air.  COPD Exacerbation: - His recurrent episodes of dyspnea are likely related to coughing-induced hypoxia. They likely represent progression of his COPD in the setting of continued tobacco use. The patient was advised that he will likely need repeat PFTs (last done in 2015) in the outpatient setting and that his disease will continue to worsen if he continues to smoke. His home regimen currently consists of a SABA + LAMA. He would benefit from a step up in therapy to SABA + LAMA + LABA. - He also continues to have wheezing on exam and would  benefit from a longer steroid course.  - Repeat ambulatory saturations today showed good oxygenation and no dyspnea while walking. Appropriate for discharge home today. Plan - continue azithromycin for a total course of 5 days (to end 9/28) -continue prednisone 40mg  today. Discharge with prednisone taper (20mg  for 3 days and 10mg  for 3 days). - discontinue umeclidinium only inhaler. Start umeclidinium and vilanterol combo. - send home with nebulizer and duonebs PRN for severe dyspnea  Elevated INR - Pt takes Coumadin for afib. INR decreased from 4.36 to 3.48 without signs or symptoms of bleeding. Will continue to hold coumadin dose today and have patient follow-up in coumadin clinic tomorrow for INR recheck and redosing.  - Hold coumadin today - F/u INR with Dr. Elie Confer tomorrow 9/27  AKI on CKD: - Kidney function has improved since admission. Cr down from 1.95 on admission to 1.26 (back to baseline).  Dispo: Anticipated discharge today.   Elgene Coral, Andree Elk, MD 04/14/2018, 6:43 AM Pager: [phone]

## 2018-04-14 NOTE — Progress Notes (Signed)
ANTICOAGULATION CONSULT NOTE  Pharmacy Consult for Coumadin Indication: atrial fibrillation  No Known Allergies  Patient Measurements: Height: 5\' 6"  (167.6 cm) Weight: 210 lb 5.1 oz (95.4 kg) IBW/kg (Calculated) : 63.8   Vital Signs: Temp: 97.7 F (36.5 C) (09/26 0546) BP: 125/64 (09/26 0900) Pulse Rate: 72 (09/26 0900)  Labs: Recent Labs    04/11/18 1910  04/11/18 2019 04/12/18 0137 04/12/18 0138 04/12/18 0701 04/13/18 0456 04/14/18 0534  HGB 14.4  --   --   --   --   --   --   --   HCT 42.9  --   --   --   --   --   --   --   PLT 215  --   --   --   --   --   --   --   LABPROT  --   --   --   --  34.7*  --  41.4* 34.7*  INR  --   --   --   --  3.48  --  4.36* 3.48  CREATININE  --    < > 1.95*  --   --  1.74* 1.55* 1.26*  TROPONINI  --   --  0.06* 0.10*  --  0.08*  --   --    < > = values in this interval not displayed.    Estimated Creatinine Clearance: 59 mL/min (A) (by C-G formula based on SCr of 1.26 mg/dL (H)).    Medications:  Coumadin 2.5 mg on TuThSS, 1.25 mg MWF, last dose unknown  Assessment: 72 yo man to continue coumadin for afib.  INR 3.48  Goal of Therapy:  INR 2-3 Monitor platelets by anticoagulation protocol: Yes   Plan:  Warfarin 0.5 mg po x 1 tonight to prevent drop to < 2 Daily INR  Thank you Anette Guarneri, PharmD [phone]  04/14/2018,11:40 AM

## 2018-04-14 NOTE — Progress Notes (Signed)
Patient alert and oriented x 4, denies pain with assess. No respiratory distress noted, patient up at [facility] ambulating the unit during shift.  He verbalizes that he is ready for discharge.  Stable condition, will continue to monitor.

## 2018-04-15 ENCOUNTER — Encounter: Payer: Self-pay | Admitting: Internal Medicine

## 2018-04-15 ENCOUNTER — Ambulatory Visit: Payer: Medicare Other

## 2018-04-18 ENCOUNTER — Ambulatory Visit (INDEPENDENT_AMBULATORY_CARE_PROVIDER_SITE_OTHER): Payer: Medicare Other | Admitting: Pharmacist

## 2018-04-18 DIAGNOSIS — Z7901 Long term (current) use of anticoagulants: Secondary | ICD-10-CM

## 2018-04-18 DIAGNOSIS — I4891 Unspecified atrial fibrillation: Secondary | ICD-10-CM

## 2018-04-18 DIAGNOSIS — Z5181 Encounter for therapeutic drug level monitoring: Secondary | ICD-10-CM

## 2018-04-18 LAB — POCT INR: INR: 2.8 (ref 2.0–3.0)

## 2018-04-18 NOTE — Progress Notes (Signed)
Anticoagulation Management [name] is a 72 y.o. male who reports to the clinic for monitoring of warfarin treatment.    Indication: atrial fibrillation  Duration: indefinite Supervising physician: [facility] Visit History: Patient does not report signs/symptoms of bleeding or thromboembolism. Other recent changes: No diet, medications, lifestyle changes except as noted in patient findings. Anticoagulation Episode Summary    Current INR goal:   2.0-3.0  TTR:   69.3 % (5.4 y)  Next INR check:   05/09/2018  INR from last check:   2.8 (04/18/2018)  Weekly max warfarin dose:     Target end date:   Indefinite  INR check location:   Anticoagulation Clinic  Preferred lab:     Send INR reminders to:      Indications   Atrial fibrillation ([place]) [I48.91]       Comments:           No Known Allergies Prior to Admission medications   Medication Sig Start Date End Date Taking? Authorizing Provider  albuterol (PROAIR HFA) 108 (90 Base) MCG/ACT inhaler Inhale 1-2 puffs into the lungs every 6 (six) hours as needed for wheezing or shortness of breath. PLACE ON HOLD UNTIL PATIENT REQUESTS TO FILL Patient taking differently: Inhale 1-2 puffs into the lungs every 6 (six) hours as needed for wheezing or shortness of breath.  09/27/15  Yes Juluis Mire, MD  amiodarone (PACERONE) 200 MG tablet Take 0.5 tablets (100 mg total) by mouth daily. 03/17/18  Yes Seiler, Amber K, NP  carvedilol (COREG) 3.125 MG tablet TAKE 1 TABLET(3.125 MG) BY MOUTH DAILY 04/14/18  Yes [name], MD  fluticasone (FLOVENT HFA) 44 MCG/ACT inhaler Inhale 2 puffs into the lungs 2 (two) times daily.   Yes [provider]  folic acid (FOLVITE) 1 MG tablet Take 1 tablet (1 mg total) by mouth daily. 08/03/17  Yes Jule Ser, DO  furosemide (LASIX) 20 MG tablet Take 1 tablet (20 mg total) by mouth daily. 03/17/18 06/15/18 Yes Seiler, Amber K, NP  hydroxyurea (HYDREA) 500 MG  capsule TAKE 2 CAPSULES BY MOUTH EVERY DAY. MAY TAKE WITH FOOD TO MINIMIZE GI SIDE EFFECTS Patient taking differently: Take 1,000 mg by mouth daily. MAY TAKE WITH FOOD TO MINIMIZE GI SIDE EFFECTS 04/08/18  Yes Joni Reining C, DO  ipratropium-albuterol (DUONEB) 0.5-2.5 (3) MG/3ML SOLN TAKE 3 MLS BY MOUTH EVERY 4 HOURS AS NEEDED FOR SHORTNESS OF BREATH 04/15/18  Yes [name], MD  predniSONE (DELTASONE) 10 MG tablet Take 2 tablets (20mg ) for three days (starting 9/27, 9/28, 9/29). Then take one tablet (10mg ) for three days (9/30, 10/1, 10/2). 04/14/18  Yes Dorrell, Andree Elk, MD  SUBOXONE 8-2 MG FILM Place 1 Film under the tongue daily.  08/21/15  Yes [provider]  umeclidinium-vilanterol (ANORO ELLIPTA) 62.5-25 MCG/INH AEPB Inhale 1 puff into the lungs daily. 04/14/18  Yes Dorrell, Andree Elk, MD   Past Medical History:  Diagnosis Date  . Acute lower GI bleeding 02/15/2017  . Atrial fibrillation /flutter    hx of flutter ablation 2/12  . Cardiomyopathy    EF55% 11/14<<35%   . CHF (congestive heart failure) ([place])   . COPD (chronic obstructive pulmonary disease) ([place])   . GERD (gastroesophageal reflux disease)   . Gout   . Hepatitis    "think I had that once; a long time ago; from dirty needles I think" (12/14/2012)  . Hypertension   . Leukocytosis, unspecified 06/12/2013  . 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 Obesity   . Pneumonia X 1  . Primary polycythemia ([place]) 06/12/2013  . Renal insufficiency   . Substance abuse ([place])    alcohol; hx cocaine, heroin, crack use  . Syncope    Social History   Socioeconomic History  . Marital status: Single    Spouse name: Not on file  . Number of children: Not on file  . Years of education: Not on file  . Highest education level: Not on file  Occupational History  . Occupation: Retired  Scientific laboratory technician  . Financial resource strain: Not on file  . Food insecurity:    Worry: Not on file    Inability: Not on file  .  Transportation needs:    Medical: Not on file    Non-medical: Not on file  Tobacco Use  . Smoking status: Current Every Day Smoker    Packs/day: 0.10    Years: 50.00    Pack years: 5.00    Types: Cigarettes  . Smokeless tobacco: Never Used  Substance and Sexual Activity  . Alcohol use: Yes    Alcohol/week: 89.0 standard drinks    Types: 12 Cans of beer, 77 Shots of liquor per week    Comment: 02/15/2017 "a pint of vodka/day" plus 12 pack of beer/week"  . Drug use: Yes    Types: "Crack" cocaine, Cocaine, Heroin    Comment: 02/15/2017 "last used crack 4-5 years ago; last used heroin a couple years ago; currently denies any drug use-- on suboxone.   . Sexual activity: Not Currently  Lifestyle  . Physical activity:    Days per week: Not on file    Minutes per session: Not on file  . Stress: Not on file  Relationships  . Social connections:    Talks on phone: Not on file    Gets together: Not on file    Attends religious service: Not on file    Active member of club or organization: Not on file    Attends meetings of clubs or organizations: Not on file    Relationship status: Not on file  Other Topics Concern  . Not on file  Social History Narrative   Moved here from [place]. Lives with common law wife and grandchildren. He is retired from "general labor."   Family History  Problem Relation Age of Onset  . Diabetes Mother   . Hypertension Mother   . Cirrhosis Father   . Alcohol abuse Father   . Colon cancer Neg Hx   . Rectal cancer Neg Hx   . Stomach cancer Neg Hx     ASSESSMENT Recent Results: The most recent result is correlated with 13.75 mg per week: Lab Results  Component Value Date   INR 2.8 04/18/2018   INR 3.48 04/14/2018   INR 4.36 (HH) 04/13/2018    Anticoagulation Dosing: Description   Take one (1) tablet of your 2.5mg  green-colored warfarin tablets on Monday, Wednesday, and Fridays all other days, take only one-half (1/2) tablet. Repeat this regimen  weekly.     INR today: Therapeutic  PLAN Weekly dose was decreased by 9% to 12.5 mg per week  Patient Instructions  Patient instructed to take medications as defined in the Anti-coagulation Track section of this encounter.  Patient instructed to take today's dose.  Patient instructed to take one (1) tablet of your 2.5mg  green-colored warfarin tablets on Monday, Wednesday, and Fridays all other days, take only one-half (1/2) tablet. Repeat this regimen weekly. Patient verbalized  understanding of these instructions.     Patient advised to contact clinic or seek medical attention if signs/symptoms of bleeding or thromboembolism occur.  Patient verbalized understanding by repeating back information and was advised to contact me if further medication-related questions arise. Patient was also provided an information handout.  Follow-up Return in 3 weeks (on 05/09/2018) for Follow-up INR at 0845.  Britt Boozer, PharmD PGY1 Pharmacy Resident  15 minutes spent face-to-face with the patient during the encounter. 50% of time spent on education. 50% of time was spent on finger stick point of care INR sample collection, processing, results determination, dose adjustment, and documentation in [email or web address].

## 2018-04-18 NOTE — Patient Instructions (Signed)
Patient instructed to take medications as defined in the Anti-coagulation Track section of this encounter.  Patient instructed to take today's dose.  Patient instructed to take one (1) tablet of your 2.5mg  green-colored warfarin tablets on Monday, Wednesday, and Fridays all other days, take only one-half (1/2) tablet. Repeat this regimen weekly. Patient verbalized understanding of these instructions.

## 2018-04-19 NOTE — Telephone Encounter (Signed)
Patient no showed HFU 04/15/2018. Hubbard Hartshorn, RN, BSN

## 2018-04-20 NOTE — Progress Notes (Signed)
I reviewed the anticoagulation note.  Patient is on [facility] for Afib.  INR was high end of goal and coumadin decreased to maintain goal INR.

## 2018-05-09 ENCOUNTER — Ambulatory Visit (INDEPENDENT_AMBULATORY_CARE_PROVIDER_SITE_OTHER): Payer: Medicare Other | Admitting: Pharmacist

## 2018-05-09 DIAGNOSIS — I4891 Unspecified atrial fibrillation: Secondary | ICD-10-CM

## 2018-05-09 DIAGNOSIS — Z5181 Encounter for therapeutic drug level monitoring: Secondary | ICD-10-CM

## 2018-05-09 DIAGNOSIS — Z7901 Long term (current) use of anticoagulants: Secondary | ICD-10-CM

## 2018-05-09 LAB — POCT INR: INR: 2.6 (ref 2.0–3.0)

## 2018-05-09 NOTE — Patient Instructions (Signed)
Patient instructed to take medications as defined in the Anti-coagulation Track section of this encounter.  Patient instructed to take today's dose.  Patient instructed to take one (1) tablet of your 2.5mg  green-colored warfarin tablets on Monday, Wednesday, and Fridays all other days, take only one-half (1/2) tablet. Repeat this regimen weekly. Patient verbalized understanding of these instructions.

## 2018-05-09 NOTE — Progress Notes (Signed)
INTERNAL MEDICINE TEACHING ATTENDING ADDENDUM  I agree with pharmacy recommendations as outlined in their note.   Alexander N Raines, MD  

## 2018-05-09 NOTE — Progress Notes (Signed)
Anticoagulation Management [name] is a 72 y.o. male who reports to the clinic for monitoring of warfarin treatment.    Indication: atrial fibrillation and long-term (current) anticoagulation use  Duration: indefinite Supervising physician: Lenice Pressman, MD  Anticoagulation Clinic Visit History: Patient does not report signs/symptoms of bleeding or thromboembolism at this time. Other recent changes: No diet, medications, lifestyle endorsed by patient. Anticoagulation Episode Summary    Current INR goal:   2.0-3.0  TTR:   69.6 % (5.5 y)  Next INR check:   05/30/2018  INR from last check:   2.6 (05/09/2018)  Weekly max warfarin dose:     Target end date:   Indefinite  INR check location:   Anticoagulation Clinic  Preferred lab:     Send INR reminders to:      Indications   Atrial fibrillation ([place]) [I48.91]       Comments:           No Known Allergies Prior to Admission medications   Medication Sig Start Date End Date Taking? Authorizing Provider  albuterol (PROAIR HFA) 108 (90 Base) MCG/ACT inhaler Inhale 1-2 puffs into the lungs every 6 (six) hours as needed for wheezing or shortness of breath. PLACE ON HOLD UNTIL PATIENT REQUESTS TO FILL Patient taking differently: Inhale 1-2 puffs into the lungs every 6 (six) hours as needed for wheezing or shortness of breath.  09/27/15  Yes Juluis Mire, MD  amiodarone (PACERONE) 200 MG tablet Take 0.5 tablets (100 mg total) by mouth daily. 03/17/18  Yes Seiler, Amber K, NP  carvedilol (COREG) 3.125 MG tablet TAKE 1 TABLET(3.125 MG) BY MOUTH DAILY 04/14/18  Yes [name], MD  fluticasone (FLOVENT HFA) 44 MCG/ACT inhaler Inhale 2 puffs into the lungs 2 (two) times daily.   Yes [provider]  folic acid (FOLVITE) 1 MG tablet Take 1 tablet (1 mg total) by mouth daily. 08/03/17  Yes Jule Ser, DO  furosemide (LASIX) 20 MG tablet Take 1 tablet (20 mg total) by mouth daily. 03/17/18 06/15/18 Yes Seiler,  Amber K, NP  hydroxyurea (HYDREA) 500 MG capsule TAKE 2 CAPSULES BY MOUTH EVERY DAY. MAY TAKE WITH FOOD TO MINIMIZE GI SIDE EFFECTS Patient taking differently: Take 1,000 mg by mouth daily. MAY TAKE WITH FOOD TO MINIMIZE GI SIDE EFFECTS 04/08/18  Yes Joni Reining C, DO  ipratropium-albuterol (DUONEB) 0.5-2.5 (3) MG/3ML SOLN TAKE 3 MLS BY MOUTH EVERY 4 HOURS AS NEEDED FOR SHORTNESS OF BREATH 04/15/18  Yes [name], MD  predniSONE (DELTASONE) 10 MG tablet Take 2 tablets (20mg ) for three days (starting 9/27, 9/28, 9/29). Then take one tablet (10mg ) for three days (9/30, 10/1, 10/2). 04/14/18  Yes Dorrell, Andree Elk, MD  SUBOXONE 8-2 MG FILM Place 1 Film under the tongue daily.  08/21/15  Yes [provider]  umeclidinium-vilanterol (ANORO ELLIPTA) 62.5-25 MCG/INH AEPB Inhale 1 puff into the lungs daily. 04/14/18  Yes Dorrell, Andree Elk, MD   Past Medical History:  Diagnosis Date  . Acute lower GI bleeding 02/15/2017  . Atrial fibrillation /flutter    hx of flutter ablation 2/12  . Cardiomyopathy    EF55% 11/14<<35%   . CHF (congestive heart failure) ([place])   . COPD (chronic obstructive pulmonary disease) ([place])   . GERD (gastroesophageal reflux disease)   . Gout   . Hepatitis    "think I had that once; a long time ago; from dirty needles I think" (12/14/2012)  . Hypertension   . Leukocytosis, unspecified  06/12/2013  . 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 Obesity   . Pneumonia X 1  . Primary polycythemia ([place]) 06/12/2013  . Renal insufficiency   . Substance abuse ([place])    alcohol; hx cocaine, heroin, crack use  . Syncope    Social History   Socioeconomic History  . Marital status: Single    Spouse name: Not on file  . Number of children: Not on file  . Years of education: Not on file  . Highest education level: Not on file  Occupational History  . Occupation: Retired  Scientific laboratory technician  . Financial resource strain: Not on file  . Food insecurity:    Worry: Not on  file    Inability: Not on file  . Transportation needs:    Medical: Not on file    Non-medical: Not on file  Tobacco Use  . Smoking status: Current Every Day Smoker    Packs/day: 0.10    Years: 50.00    Pack years: 5.00    Types: Cigarettes  . Smokeless tobacco: Never Used  Substance and Sexual Activity  . Alcohol use: Yes    Alcohol/week: 89.0 standard drinks    Types: 12 Cans of beer, 77 Shots of liquor per week    Comment: 02/15/2017 "a pint of vodka/day" plus 12 pack of beer/week"  . Drug use: Yes    Types: "Crack" cocaine, Cocaine, Heroin    Comment: 02/15/2017 "last used crack 4-5 years ago; last used heroin a couple years ago; currently denies any drug use-- on suboxone.   . Sexual activity: Not Currently  Lifestyle  . Physical activity:    Days per week: Not on file    Minutes per session: Not on file  . Stress: Not on file  Relationships  . Social connections:    Talks on phone: Not on file    Gets together: Not on file    Attends religious service: Not on file    Active member of club or organization: Not on file    Attends meetings of clubs or organizations: Not on file    Relationship status: Not on file  Other Topics Concern  . Not on file  Social History Narrative   Moved here from [place]. Lives with common law wife and grandchildren. He is retired from "general labor."   Family History  Problem Relation Age of Onset  . Diabetes Mother   . Hypertension Mother   . Cirrhosis Father   . Alcohol abuse Father   . Colon cancer Neg Hx   . Rectal cancer Neg Hx   . Stomach cancer Neg Hx     ASSESSMENT Recent Results: The most recent result is correlated with 12.5 mg per week: Lab Results  Component Value Date   INR 2.6 05/09/2018   INR 2.8 04/18/2018   INR 3.48 04/14/2018    Anticoagulation Dosing: Description   Take one (1) tablet of your 2.5mg  green-colored warfarin tablets on Monday, Wednesday, and Fridays all other days, take only one-half (1/2)  tablet. Repeat this regimen weekly.     INR today: Therapeutic  PLAN Weekly dose was unchanged.  Patient Instructions  Patient instructed to take medications as defined in the Anti-coagulation Track section of this encounter.  Patient instructed to take today's dose.  Patient instructed to take one (1) tablet of your 2.5mg  green-colored warfarin tablets on Monday, Wednesday, and Fridays all other days, take only one-half (1/2) tablet. Repeat this regimen weekly. Patient verbalized understanding of these  instructions.    Patient advised to contact clinic or seek medical attention if signs/symptoms of bleeding or thromboembolism occur.  Patient verbalized understanding by repeating back information and was advised to contact me if further medication-related questions arise. Patient was also provided an information handout.  Follow-up Return in 3 weeks (on 05/30/2018) for Follow up INR at 0845.  15 minutes spent face-to-face with the patient during the encounter. 50% of time spent on education. 50% of time was spent on point of care INR testing, interpreting results, patient counseling, and documentation in [email or web address].  Thank you for allowing pharmacy to be a part of this patient's care.  Leron Croak, PharmD PGY1 Pharmacy Resident Phone: [phone]

## 2018-05-20 ENCOUNTER — Other Ambulatory Visit: Payer: Self-pay | Admitting: *Deleted

## 2018-05-20 ENCOUNTER — Other Ambulatory Visit: Payer: Self-pay | Admitting: Internal Medicine

## 2018-05-20 DIAGNOSIS — D45 Polycythemia vera: Secondary | ICD-10-CM

## 2018-05-20 DIAGNOSIS — I4891 Unspecified atrial fibrillation: Secondary | ICD-10-CM

## 2018-05-20 MED ORDER — WARFARIN SODIUM 2.5 MG PO TABS: 2.5000 mg | ORAL_TABLET | Freq: Every day | ORAL | 3 refills | Status: DC

## 2018-05-20 MED ORDER — HYDROXYUREA 500 MG PO CAPS: 1000.0000 mg | ORAL_CAPSULE | Freq: Every day | ORAL | 3 refills | Status: DC

## 2018-05-20 NOTE — Telephone Encounter (Signed)
Also the pharmacy asking for refill on Warfarin  Take 1 tab all days of the week except Mon/Wed Fri-take only 1/2 tab on these days. Not on current medication list. Thanks

## 2018-05-23 ENCOUNTER — Inpatient Hospital Stay: Payer: Medicare Other

## 2018-05-23 ENCOUNTER — Inpatient Hospital Stay (HOSPITAL_BASED_OUTPATIENT_CLINIC_OR_DEPARTMENT_OTHER): Payer: Medicare Other | Admitting: Oncology

## 2018-05-23 ENCOUNTER — Inpatient Hospital Stay: Payer: Medicare Other | Admitting: Hematology and Oncology

## 2018-05-23 ENCOUNTER — Telehealth: Payer: Self-pay

## 2018-05-23 ENCOUNTER — Other Ambulatory Visit: Payer: Self-pay

## 2018-05-23 ENCOUNTER — Encounter: Payer: Self-pay | Admitting: Oncology

## 2018-05-23 ENCOUNTER — Inpatient Hospital Stay: Payer: Medicare Other | Attending: Hematology and Oncology

## 2018-05-23 VITALS — BP 125/76 | HR 85 | Temp 97.6°F | Resp 18 | Ht 66.0 in | Wt 222.9 lb

## 2018-05-23 DIAGNOSIS — F17210 Nicotine dependence, cigarettes, uncomplicated: Secondary | ICD-10-CM | POA: Diagnosis not present

## 2018-05-23 DIAGNOSIS — D72819 Decreased white blood cell count, unspecified: Secondary | ICD-10-CM | POA: Diagnosis not present

## 2018-05-23 DIAGNOSIS — F1020 Alcohol dependence, uncomplicated: Secondary | ICD-10-CM | POA: Insufficient documentation

## 2018-05-23 DIAGNOSIS — D45 Polycythemia vera: Secondary | ICD-10-CM | POA: Diagnosis present

## 2018-05-23 DIAGNOSIS — D701 Agranulocytosis secondary to cancer chemotherapy: Secondary | ICD-10-CM

## 2018-05-23 DIAGNOSIS — Z79899 Other long term (current) drug therapy: Secondary | ICD-10-CM

## 2018-05-23 DIAGNOSIS — Z7901 Long term (current) use of anticoagulants: Secondary | ICD-10-CM | POA: Diagnosis not present

## 2018-05-23 DIAGNOSIS — T451X5A Adverse effect of antineoplastic and immunosuppressive drugs, initial encounter: Secondary | ICD-10-CM

## 2018-05-23 DIAGNOSIS — I4891 Unspecified atrial fibrillation: Secondary | ICD-10-CM | POA: Insufficient documentation

## 2018-05-23 DIAGNOSIS — Z720 Tobacco use: Secondary | ICD-10-CM

## 2018-05-23 LAB — CBC WITH DIFFERENTIAL/PLATELET
ABS IMMATURE GRANULOCYTES: 0.01 10*3/uL (ref 0.00–0.07)
BASOS ABS: 0 10*3/uL (ref 0.0–0.1)
BASOS PCT: 1 %
Eosinophils Absolute: 0 10*3/uL (ref 0.0–0.5)
Eosinophils Relative: 1 %
HCT: 37.9 % — ABNORMAL LOW (ref 39.0–52.0)
Hemoglobin: 13 g/dL (ref 13.0–17.0)
IMMATURE GRANULOCYTES: 0 %
Lymphocytes Relative: 19 %
Lymphs Abs: 0.7 10*3/uL (ref 0.7–4.0)
MCH: 41.5 pg — ABNORMAL HIGH (ref 26.0–34.0)
MCHC: 34.3 g/dL (ref 30.0–36.0)
MCV: 121.1 fL — ABNORMAL HIGH (ref 80.0–100.0)
Monocytes Absolute: 0.4 10*3/uL (ref 0.1–1.0)
Monocytes Relative: 11 %
NEUTROS ABS: 2.6 10*3/uL (ref 1.7–7.7)
NEUTROS PCT: 68 %
PLATELETS: 166 10*3/uL (ref 150–400)
RBC: 3.13 MIL/uL — AB (ref 4.22–5.81)
RDW: 13.1 % (ref 11.5–15.5)
WBC: 3.8 10*3/uL — AB (ref 4.0–10.5)
nRBC: 0 % (ref 0.0–0.2)

## 2018-05-23 NOTE — Assessment & Plan Note (Signed)
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  This is stable.  Continue to observe.

## 2018-05-23 NOTE — Assessment & Plan Note (Signed)
The patient was counseled about smoking cessation.  He is trying to quit on his own.

## 2018-05-23 NOTE — Progress Notes (Signed)
[facility] [facility] OFFICE PROGRESS NOTE  [name], MD [address] [place] [address]  DIAGNOSIS: Polycythemia vera. JAK 2 mutation positive.   CURRENT THERAPY: Hydroxyurea 1000 mg daily.  INTERVAL HISTORY: [name] 72 y.o. male returns for routine follow-up visit by himself.  The patient is feeling fine today and has no specific complaints.  Tolerating hydroxyurea fairly well.  Denies nausea, vomiting, constipation, diarrhea.  Denies skin changes.  Denies fevers and chills.  Denies chest pain, shortness of breath, cough, hemoptysis.  Denies bleeding.  Denies recent infection.  Denies recent weight loss or night sweats.  The patient continues to smoke 1 to 2 cigarettes/day.  He reports that he drinks a pint of rum every other day.  The patient is here for evaluation and repeat lab work.  MEDICAL HISTORY: Past Medical History:  Diagnosis Date  . Acute lower GI bleeding 02/15/2017  . Atrial fibrillation /flutter    hx of flutter ablation 2/12  . Cardiomyopathy    EF55% 11/14<<35%   . CHF (congestive heart failure) ([place])   . COPD (chronic obstructive pulmonary disease) ([place])   . GERD (gastroesophageal reflux disease)   . Gout   . Hepatitis    "think I had that once; a long time ago; from dirty needles I think" (12/14/2012)  . Hypertension   . Leukocytosis, unspecified 06/12/2013  . 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 Obesity   . Pneumonia X 1  . Primary polycythemia ([place]) 06/12/2013  . Renal insufficiency   . Substance abuse ([place])    alcohol; hx cocaine, heroin, crack use  . Syncope     ALLERGIES:  has No Known Allergies.  MEDICATIONS:  Current Outpatient Medications  Medication Sig Dispense Refill  . albuterol (PROAIR HFA) 108 (90 Base) MCG/ACT inhaler Inhale 1-2 puffs into the lungs every 6 (six) hours as needed for wheezing or shortness of breath. PLACE ON HOLD UNTIL PATIENT REQUESTS TO FILL (Patient taking  differently: Inhale 1-2 puffs into the lungs every 6 (six) hours as needed for wheezing or shortness of breath. ) 18 g 3  . amiodarone (PACERONE) 200 MG tablet Take 0.5 tablets (100 mg total) by mouth daily. 90 tablet 3  . carvedilol (COREG) 3.125 MG tablet TAKE 1 TABLET(3.125 MG) BY MOUTH DAILY 90 tablet 1  . fluticasone (FLOVENT HFA) 44 MCG/ACT inhaler Inhale 2 puffs into the lungs 2 (two) times daily.    . folic acid (FOLVITE) 1 MG tablet Take 1 tablet (1 mg total) by mouth daily. 90 tablet 3  . furosemide (LASIX) 20 MG tablet Take 1 tablet (20 mg total) by mouth daily. 90 tablet 3  . hydroxyurea (HYDREA) 500 MG capsule Take 2 capsules (1,000 mg total) by mouth daily. MAY TAKE WITH FOOD TO MINIMIZE GI SIDE EFFECTS 180 capsule 3  . ipratropium-albuterol (DUONEB) 0.5-2.5 (3) MG/3ML SOLN TAKE 3 MLS BY MOUTH EVERY 4 HOURS AS NEEDED FOR SHORTNESS OF BREATH 5400 mL 3  . predniSONE (DELTASONE) 10 MG tablet Take 2 tablets (20mg ) for three days (starting 9/27, 9/28, 9/29). Then take one tablet (10mg ) for three days (9/30, 10/1, 10/2). 9 tablet 0  . SUBOXONE 8-2 MG FILM Place 1 Film under the tongue daily.     [name] umeclidinium-vilanterol (ANORO ELLIPTA) 62.5-25 MCG/INH AEPB Inhale 1 puff into the lungs daily. 30 each 3  . warfarin (COUMADIN) 2.5 MG tablet Take 1 tablet (2.5 mg total) by mouth daily. 1 tablet Monday, Wednesday, Friday  and 1/2 tablet the other days. 30 tablet 3   No current facility-administered medications for this visit.     SURGICAL HISTORY:  Past Surgical History:  Procedure Laterality Date  . CARDIAC ELECTROPHYSIOLOGY MAPPING AND ABLATION  08/2010   [name] 09/07/2010 (12/14/2012)  . COLONOSCOPY     15-20 years ago had colon in [place]  . EXCISIONAL HEMORRHOIDECTOMY  1970's  . LOOP RECORDER IMPLANT N/A 08/23/2013   Procedure: LOOP RECORDER IMPLANT;  Surgeon: Deboraha Sprang, MD;  Location: [facility] CATH LAB;  Service: Cardiovascular;  Laterality: N/A;  . MULTIPLE EXTRACTIONS WITH ALVEOLOPLASTY  Bilateral 01/24/2016   Procedure: MULTIPLE EXTRACTION WITH ALVEOLOPLASTY BILATERAL;  Surgeon: Diona Browner, DDS;  Location: [facility];  Service: Oral Surgery;  Laterality: Bilateral;  . MULTIPLE TOOTH EXTRACTIONS  01/24/2016   MULTIPLE EXTRACTION WITH ALVEOLOPLASTY BILATERAL (Bilateral)    REVIEW OF SYSTEMS:   Review of Systems  Constitutional: Negative for appetite change, chills, fatigue, fever and unexpected weight change.  HENT:   Negative for mouth sores, nosebleeds, sore throat and trouble swallowing.   Eyes: Negative for eye problems and icterus.  Respiratory: Negative for cough, hemoptysis, shortness of breath and wheezing.   Cardiovascular: Negative for chest pain and leg swelling.  Gastrointestinal: Negative for abdominal pain, constipation, diarrhea, nausea and vomiting.  Genitourinary: Negative for bladder incontinence, difficulty urinating, dysuria, frequency and hematuria.   Musculoskeletal: Negative for back pain, gait problem, neck pain and neck stiffness.  Skin: Negative for itching and rash.  Neurological: Negative for dizziness, extremity weakness, gait problem, headaches, light-headedness and seizures.  Hematological: Negative for adenopathy. Does not bruise/bleed easily.  Psychiatric/Behavioral: Negative for confusion, depression and sleep disturbance. The patient is not nervous/anxious.     PHYSICAL EXAMINATION:  Blood pressure 125/76, pulse 85, temperature 97.6 F (36.4 C), temperature source Oral, resp. rate 18, height 5\' 6"  (1.676 m), weight 222 lb 14.4 oz (101.1 kg), SpO2 95 %.  Physical Exam  Constitutional: Oriented to person, place, and time and well-developed, well-nourished, and in no distress. No distress.  HENT:  Head: Normocephalic and atraumatic.  Mouth/Throat: Oropharynx is clear and moist. No oropharyngeal exudate.  Eyes: Conjunctivae are normal. Right eye exhibits no discharge. Left eye exhibits no discharge. No scleral icterus.  Neck: Normal range of  motion. Neck supple.  Cardiovascular: Normal rate, regular rhythm, normal heart sounds and intact distal pulses.   Pulmonary/Chest: Effort normal and breath sounds normal. No respiratory distress. No wheezes. No rales.  Abdominal: Soft. Bowel sounds are normal. Exhibits no distension and no mass. There is no tenderness.  Musculoskeletal: Normal range of motion. Exhibits no edema.  Lymphadenopathy:    No cervical adenopathy.  Neurological: Alert and oriented to person, place, and time. Exhibits normal muscle tone. Gait normal. Coordination normal.  Skin: Skin is warm and dry. No rash noted. Not diaphoretic. No erythema. No pallor.  Psychiatric: Mood, memory and judgment normal.  Vitals reviewed.  LABORATORY DATA: Lab Results  Component Value Date   WBC 3.8 (L) 05/23/2018   HGB 13.0 05/23/2018   HCT 37.9 (L) 05/23/2018   MCV 121.1 (H) 05/23/2018   PLT 166 05/23/2018      Chemistry      Component Value Date/Time   NA 140 04/14/2018 0534   NA 148 (H) 03/17/2018 0901   NA 143 06/12/2013 1125   K 3.6 04/14/2018 0534   K 3.7 06/12/2013 1125   CL 103 04/14/2018 0534   CO2 31 04/14/2018 0534   CO2 32 (H)  06/12/2013 1125   BUN 27 (H) 04/14/2018 0534   BUN 15 03/17/2018 0901   BUN 14.6 06/12/2013 1125   CREATININE 1.26 (H) 04/14/2018 0534   CREATININE 1.05 08/30/2014 1628   CREATININE 0.9 06/12/2013 1125      Component Value Date/Time   CALCIUM 8.4 (L) 04/14/2018 0534   CALCIUM 8.9 06/12/2013 1125   ALKPHOS 107 04/11/2018 2019   ALKPHOS 112 06/12/2013 1125   AST 40 04/11/2018 2019   AST 21 06/12/2013 1125   ALT 26 04/11/2018 2019   ALT 28 06/12/2013 1125   BILITOT 0.8 04/11/2018 2019   BILITOT <0.2 01/24/2018 0954   BILITOT 0.82 06/12/2013 1125       RADIOGRAPHIC STUDIES:  No results found.   ASSESSMENT/PLAN:  Polycythemia vera ([place]) This is a pleasant 72 year old male with polycythemia vera.  He is JAK2 mutation positive.  On hydroxyurea 1000 mg daily and  tolerating this medication well.  Labs have been reviewed and remain stable.  Recommend for him to continue on hydroxyurea.  He will follow-up in 6 months for evaluation and repeat lab work.  Tobacco abuse The patient was counseled about smoking cessation.  He is trying to quit on his own.  Alcoholism [facility]) The patient has ongoing alcoholism.  He is drinking a pint of rum every other day.  Alcoholism is likely contributing to his leukopenia.  Recommend abstinence from alcohol.  Chronic anticoagulation Remains on warfarin.  Denies signs and symptoms of bleeding.  Continue warfarin under the guidance of his primary care provider.  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  This is stable.  Continue to observe.   No orders of the defined types were placed in this encounter.    Mikey Bussing, DNP, AGPCNP-BC, AOCNP 05/23/18

## 2018-05-23 NOTE — Telephone Encounter (Signed)
Printed avs and calender of upcoming appointment. Per 1/14 los 

## 2018-05-23 NOTE — Assessment & Plan Note (Signed)
The patient has ongoing alcoholism.  He is drinking a pint of rum every other day.  Alcoholism is likely contributing to his leukopenia.  Recommend abstinence from alcohol.

## 2018-05-23 NOTE — Assessment & Plan Note (Signed)
This is a pleasant 72 year old male with polycythemia vera.  He is JAK2 mutation positive.  On hydroxyurea 1000 mg daily and tolerating this medication well.  Labs have been reviewed and remain stable.  Recommend for him to continue on hydroxyurea.  He will follow-up in 6 months for evaluation and repeat lab work.

## 2018-05-23 NOTE — Assessment & Plan Note (Signed)
Remains on warfarin.  Denies signs and symptoms of bleeding.  Continue warfarin under the guidance of his primary care provider.

## 2018-05-30 ENCOUNTER — Ambulatory Visit (INDEPENDENT_AMBULATORY_CARE_PROVIDER_SITE_OTHER): Payer: Medicare Other

## 2018-05-30 DIAGNOSIS — Z5181 Encounter for therapeutic drug level monitoring: Secondary | ICD-10-CM | POA: Diagnosis not present

## 2018-05-30 DIAGNOSIS — I4891 Unspecified atrial fibrillation: Secondary | ICD-10-CM

## 2018-05-30 DIAGNOSIS — Z7901 Long term (current) use of anticoagulants: Secondary | ICD-10-CM | POA: Diagnosis not present

## 2018-05-30 LAB — POCT INR: INR: 3.6 — AB (ref 2.0–3.0)

## 2018-05-30 NOTE — Progress Notes (Signed)
Anticoagulation Management [name] is a 72 y.o. male who reports to the clinic for monitoring of warfarin treatment.    Indication: atrial fibrillation  Duration: indefinite Supervising physician: Aldine Contes  Anticoagulation Clinic Visit History: Patient does not report signs/symptoms of bleeding or thromboembolism  Other recent changes: no diet, medications, lifestyle changes reported by patient Anticoagulation Episode Summary    Current INR goal:   2.0-3.0  TTR:   69.3 % (5.5 y)  Next INR check:   06/20/2018  INR from last check:   3.6! (05/30/2018)  Weekly max warfarin dose:     Target end date:   Indefinite  INR check location:   Anticoagulation Clinic  Preferred lab:     Send INR reminders to:      Indications   Atrial fibrillation ([place]) [I48.91]       Comments:           No Known Allergies Prior to Admission medications   Medication Sig Start Date End Date Taking? Authorizing Provider  albuterol (PROAIR HFA) 108 (90 Base) MCG/ACT inhaler Inhale 1-2 puffs into the lungs every 6 (six) hours as needed for wheezing or shortness of breath. PLACE ON HOLD UNTIL PATIENT REQUESTS TO FILL Patient taking differently: Inhale 1-2 puffs into the lungs every 6 (six) hours as needed for wheezing or shortness of breath.  09/27/15   Juluis Mire, MD  amiodarone (PACERONE) 200 MG tablet Take 0.5 tablets (100 mg total) by mouth daily. 03/17/18   Chanetta Marshall K, NP  carvedilol (COREG) 3.125 MG tablet TAKE 1 TABLET(3.125 MG) BY MOUTH DAILY 04/14/18   [name], MD  fluticasone (FLOVENT HFA) 44 MCG/ACT inhaler Inhale 2 puffs into the lungs 2 (two) times daily.    [provider]  folic acid (FOLVITE) 1 MG tablet Take 1 tablet (1 mg total) by mouth daily. 08/03/17   Jule Ser, DO  furosemide (LASIX) 20 MG tablet Take 1 tablet (20 mg total) by mouth daily. 03/17/18 06/15/18  Patsey Berthold, NP  hydroxyurea (HYDREA) 500 MG capsule Take 2 capsules (1,000  mg total) by mouth daily. MAY TAKE WITH FOOD TO MINIMIZE GI SIDE EFFECTS 05/20/18   [name], MD  ipratropium-albuterol (DUONEB) 0.5-2.5 (3) MG/3ML SOLN TAKE 3 MLS BY MOUTH EVERY 4 HOURS AS NEEDED FOR SHORTNESS OF BREATH 04/15/18   [name], MD  predniSONE (DELTASONE) 10 MG tablet Take 2 tablets (20mg ) for three days (starting 9/27, 9/28, 9/29). Then take one tablet (10mg ) for three days (9/30, 10/1, 10/2). 04/14/18   Dorrell, Andree Elk, MD  SUBOXONE 8-2 MG FILM Place 1 Film under the tongue daily.  08/21/15   [provider]  umeclidinium-vilanterol (ANORO ELLIPTA) 62.5-25 MCG/INH AEPB Inhale 1 puff into the lungs daily. 04/14/18   Dorrell, Andree Elk, MD  warfarin (COUMADIN) 2.5 MG tablet Take 1 tablet (2.5 mg total) by mouth daily. 1 tablet Monday, Wednesday, Friday and 1/2 tablet the other days. 05/20/18   [name], MD   Past Medical History:  Diagnosis Date  . Acute lower GI bleeding 02/15/2017  . Atrial fibrillation /flutter    hx of flutter ablation 2/12  . Cardiomyopathy    EF55% 11/14<<35%   . CHF (congestive heart failure) ([place])   . COPD (chronic obstructive pulmonary disease) ([place])   . GERD (gastroesophageal reflux disease)   . Gout   . Hepatitis    "think I had that once; a long time ago; from dirty needles I  think" (12/14/2012)  . Hypertension   . Leukocytosis, unspecified 06/12/2013  . Myeloproliferative neoplasm ([place]) 08/15/2013  . Obesity   . Pneumonia X 1  . Primary polycythemia ([place]) 06/12/2013  . Renal insufficiency   . Substance abuse ([place])    alcohol; hx cocaine, heroin, crack use  . Syncope    Social History   Socioeconomic History  . Marital status: Single    Spouse name: Not on file  . Number of children: Not on file  . Years of education: Not on file  . Highest education level: Not on file  Occupational History  . Occupation: Retired  Scientific laboratory technician  . Financial resource strain: Not on file  . Food insecurity:    Worry: Not on file     Inability: Not on file  . Transportation needs:    Medical: Not on file    Non-medical: Not on file  Tobacco Use  . Smoking status: Current Every Day Smoker    Packs/day: 0.10    Years: 50.00    Pack years: 5.00    Types: Cigarettes  . Smokeless tobacco: Never Used  Substance and Sexual Activity  . Alcohol use: Yes    Alcohol/week: 89.0 standard drinks    Types: 12 Cans of beer, 77 Shots of liquor per week    Comment: 02/15/2017 "a pint of vodka/day" plus 12 pack of beer/week"  . Drug use: Yes    Types: "Crack" cocaine, Cocaine, Heroin    Comment: 02/15/2017 "last used crack 4-5 years ago; last used heroin a couple years ago; currently denies any drug use-- on suboxone.   . Sexual activity: Not Currently  Lifestyle  . Physical activity:    Days per week: Not on file    Minutes per session: Not on file  . Stress: Not on file  Relationships  . Social connections:    Talks on phone: Not on file    Gets together: Not on file    Attends religious service: Not on file    Active member of club or organization: Not on file    Attends meetings of clubs or organizations: Not on file    Relationship status: Not on file  Other Topics Concern  . Not on file  Social History Narrative   Moved here from [place]. Lives with common law wife and grandchildren. He is retired from "general labor."   Family History  Problem Relation Age of Onset  . Diabetes Mother   . Hypertension Mother   . Cirrhosis Father   . Alcohol abuse Father   . Colon cancer Neg Hx   . Rectal cancer Neg Hx   . Stomach cancer Neg Hx     ASSESSMENT Recent Results: The most recent result is correlated with 12.5 mg per week: Lab Results  Component Value Date   INR 3.6 (A) 05/30/2018   INR 2.6 05/09/2018   INR 2.8 04/18/2018    Anticoagulation Dosing: Description   Take one (1) tablet of your 2.5mg  green-colored warfarin tablets on Wednesdays, and Fridays all other days, take only one-half (1/2) tablet.  Repeat this regimen weekly.     INR today: Supratherapeutic  PLAN Weekly dose was decreased by 10% to 11.25 mg per week  Patient Instructions  Patient instructed to take medications as defined in the Anti-coagulation Track section of this encounter.  Patient instructed to take today's dose.  Patient instructed to take one (1) tablet of your 2.5mg  green-colored warfarin tablets on Wednesdays, and Fridays all  other days, take only one-half (1/2) tablet. Repeat this regimen weekly. Patient verbalized understanding of these instructions.   Patient advised to contact clinic or seek medical attention if signs/symptoms of bleeding or thromboembolism occur.  Patient verbalized understanding by repeating back information and was advised to contact me if further medication-related questions arise. Patient was also provided an information handout.  Follow-up Return in about 3 weeks (around 06/20/2018) for INR follow-up at 0845.  Juanell Fairly, PharmD PGY1 Pharmacy Resident Phone [phone] 05/30/2018 9:39 AM  15 minutes spent face-to-face with the patient during the encounter. 50% of time spent on education. 50% of time was spent on INR sample collection, results determination, dose changes, documentation in [facility] and [email or web address]

## 2018-05-30 NOTE — Patient Instructions (Signed)
Patient instructed to take medications as defined in the Anti-coagulation Track section of this encounter.  Patient instructed to take today's dose.  Patient instructed to take one (1) tablet of your 2.5mg  green-colored warfarin tablets on Wednesdays, and Fridays all other days, take only one-half (1/2) tablet. Repeat this regimen weekly. Patient verbalized understanding of these instructions.

## 2018-06-02 NOTE — Progress Notes (Signed)
INTERNAL MEDICINE TEACHING ATTENDING ADDENDUM - Bernabe Dorce M.D  Duration- indefinite, Indication- afib, INR- supratherapeutic. Agree with pharmacy recommendations as outlined in their note.     

## 2018-06-20 ENCOUNTER — Telehealth: Payer: Self-pay | Admitting: Pharmacist

## 2018-06-20 ENCOUNTER — Other Ambulatory Visit: Payer: Self-pay | Admitting: Pharmacist

## 2018-06-20 ENCOUNTER — Ambulatory Visit (INDEPENDENT_AMBULATORY_CARE_PROVIDER_SITE_OTHER): Payer: Medicare Other

## 2018-06-20 DIAGNOSIS — I4891 Unspecified atrial fibrillation: Secondary | ICD-10-CM | POA: Diagnosis present

## 2018-06-20 DIAGNOSIS — Z5181 Encounter for therapeutic drug level monitoring: Secondary | ICD-10-CM | POA: Diagnosis not present

## 2018-06-20 DIAGNOSIS — Z7901 Long term (current) use of anticoagulants: Secondary | ICD-10-CM | POA: Diagnosis not present

## 2018-06-20 DIAGNOSIS — I482 Chronic atrial fibrillation, unspecified: Secondary | ICD-10-CM

## 2018-06-20 LAB — POCT INR: INR: 4.3 — AB (ref 2.0–3.0)

## 2018-06-20 MED ORDER — AMIODARONE HCL 200 MG PO TABS: 100.0000 mg | ORAL_TABLET | Freq: Every day | ORAL | 3 refills | Status: DC

## 2018-06-20 NOTE — Progress Notes (Signed)
Anticoagulation Management [name] is a 72 y.o. male who reports to the clinic for monitoring of warfarin treatment.    Indication: atrial fibrillation  Duration: indefinite Supervising physician: Aldine Contes  Anticoagulation Clinic Visit History: Patient does not report signs/symptoms of bleeding or thromboembolism  Other recent changes: No diet, medications, lifestyle changes other than what is reported on patient findings and [email or web address] Anticoagulation Episode Summary    Current INR goal:   2.0-3.0  TTR:   68.6 % (5.6 y)  Next INR check:   07/04/2018  INR from last check:   4.3! (06/20/2018)  Weekly max warfarin dose:     Target end date:   Indefinite  INR check location:   Anticoagulation Clinic  Preferred lab:     Send INR reminders to:      Indications   Atrial fibrillation ([place]) [I48.91]       Comments:           No Known Allergies Prior to Admission medications   Medication Sig Start Date End Date Taking? Authorizing Provider  albuterol (PROAIR HFA) 108 (90 Base) MCG/ACT inhaler Inhale 1-2 puffs into the lungs every 6 (six) hours as needed for wheezing or shortness of breath. PLACE ON HOLD UNTIL PATIENT REQUESTS TO FILL Patient taking differently: Inhale 1-2 puffs into the lungs every 6 (six) hours as needed for wheezing or shortness of breath.  09/27/15   Juluis Mire, MD  amiodarone (PACERONE) 200 MG tablet Take 0.5 tablets (100 mg total) by mouth daily. 03/17/18   Chanetta Marshall K, NP  carvedilol (COREG) 3.125 MG tablet TAKE 1 TABLET(3.125 MG) BY MOUTH DAILY 04/14/18   [name], MD  fluticasone (FLOVENT HFA) 44 MCG/ACT inhaler Inhale 2 puffs into the lungs 2 (two) times daily.    [provider]  folic acid (FOLVITE) 1 MG tablet Take 1 tablet (1 mg total) by mouth daily. 08/03/17   Jule Ser, DO  furosemide (LASIX) 20 MG tablet Take 1 tablet (20 mg total) by mouth daily. 03/17/18 06/15/18  Patsey Berthold, NP  hydroxyurea  (HYDREA) 500 MG capsule Take 2 capsules (1,000 mg total) by mouth daily. MAY TAKE WITH FOOD TO MINIMIZE GI SIDE EFFECTS 05/20/18   [name], MD  ipratropium-albuterol (DUONEB) 0.5-2.5 (3) MG/3ML SOLN TAKE 3 MLS BY MOUTH EVERY 4 HOURS AS NEEDED FOR SHORTNESS OF BREATH 04/15/18   [name], MD  predniSONE (DELTASONE) 10 MG tablet Take 2 tablets (20mg ) for three days (starting 9/27, 9/28, 9/29). Then take one tablet (10mg ) for three days (9/30, 10/1, 10/2). 04/14/18   Dorrell, Andree Elk, MD  SUBOXONE 8-2 MG FILM Place 1 Film under the tongue daily.  08/21/15   [provider]  umeclidinium-vilanterol (ANORO ELLIPTA) 62.5-25 MCG/INH AEPB Inhale 1 puff into the lungs daily. 04/14/18   Dorrell, Andree Elk, MD  warfarin (COUMADIN) 2.5 MG tablet Take 1 tablet (2.5 mg total) by mouth daily. 1 tablet Monday, Wednesday, Friday and 1/2 tablet the other days. 05/20/18   [name], MD   Past Medical History:  Diagnosis Date  . Acute lower GI bleeding 02/15/2017  . Atrial fibrillation /flutter    hx of flutter ablation 2/12  . Cardiomyopathy    EF55% 11/14<<35%   . CHF (congestive heart failure) ([place])   . COPD (chronic obstructive pulmonary disease) ([place])   . GERD (gastroesophageal reflux disease)   . Gout   . Hepatitis    "think I had that once; a  long time ago; from dirty needles I think" (12/14/2012)  . Hypertension   . Leukocytosis, unspecified 06/12/2013  . 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 Obesity   . Pneumonia X 1  . Primary polycythemia ([place]) 06/12/2013  . Renal insufficiency   . Substance abuse ([place])    alcohol; hx cocaine, heroin, crack use  . Syncope    Social History   Socioeconomic History  . Marital status: Single    Spouse name: Not on file  . Number of children: Not on file  . Years of education: Not on file  . Highest education level: Not on file  Occupational History  . Occupation: Retired  Scientific laboratory technician  . Financial resource strain: Not on  file  . Food insecurity:    Worry: Not on file    Inability: Not on file  . Transportation needs:    Medical: Not on file    Non-medical: Not on file  Tobacco Use  . Smoking status: Current Every Day Smoker    Packs/day: 0.10    Years: 50.00    Pack years: 5.00    Types: Cigarettes  . Smokeless tobacco: Never Used  Substance and Sexual Activity  . Alcohol use: Yes    Alcohol/week: 89.0 standard drinks    Types: 12 Cans of beer, 77 Shots of liquor per week    Comment: 02/15/2017 "a pint of vodka/day" plus 12 pack of beer/week"  . Drug use: Yes    Types: "Crack" cocaine, Cocaine, Heroin    Comment: 02/15/2017 "last used crack 4-5 years ago; last used heroin a couple years ago; currently denies any drug use-- on suboxone.   . Sexual activity: Not Currently  Lifestyle  . Physical activity:    Days per week: Not on file    Minutes per session: Not on file  . Stress: Not on file  Relationships  . Social connections:    Talks on phone: Not on file    Gets together: Not on file    Attends religious service: Not on file    Active member of club or organization: Not on file    Attends meetings of clubs or organizations: Not on file    Relationship status: Not on file  Other Topics Concern  . Not on file  Social History Narrative   Moved here from [place]. Lives with common law wife and grandchildren. He is retired from "general labor."   Family History  Problem Relation Age of Onset  . Diabetes Mother   . Hypertension Mother   . Cirrhosis Father   . Alcohol abuse Father   . Colon cancer Neg Hx   . Rectal cancer Neg Hx   . Stomach cancer Neg Hx     ASSESSMENT Recent Results: The most recent result is correlated with 11.25 mg per week: Lab Results  Component Value Date   INR 4.3 (A) 06/20/2018   INR 3.6 (A) 05/30/2018   INR 2.6 05/09/2018    Anticoagulation Dosing: Description   Hold your dose of warfarin tomorrow, Tuesday 12/3. Then take one (1) tablet of your  2.5mg  green-colored warfarin tablets on Fridays all other days, take only one-half (1/2) tablet. Repeat this regimen weekly.     INR today: Supratherapeutic  PLAN Weekly dose was decreased by 22% to 8.75 mg per week  Patient Instructions  Patient instructed to take medications as defined in the Anti-coagulation Track section of this encounter.  Patient instructed to HOLD tomorrow's dose as he has already  taken today's dose prior to the appointment. Patient instructed to hold his dose of warfarin tomorrow, Tuesday 12/3. Then take one (1) tablet of the 2.5mg  green-colored warfarin tablets on Fridays all other days, take only one-half (1/2) tablet. Repeat this regimen weekly. Patient verbalized understanding of these instructions.     Patient advised to contact clinic or seek medical attention if signs/symptoms of bleeding or thromboembolism occur.  Patient verbalized understanding by repeating back information and was advised to contact me if further medication-related questions arise. Patient was also provided an information handout.  Follow-up Return in about 2 weeks (around 07/04/2018) for F/u INR @1045 .  Juanell Fairly, PharmD PGY1 Pharmacy Resident Phone [phone] 06/20/2018 9:25 AM  15 minutes spent face-to-face with the patient during the encounter. 50% of time spent on education. 50% of time was spent on POC INR sample collection, results determination, dose adjustments, and documentation in [facility] and [email or web address]

## 2018-06-20 NOTE — Telephone Encounter (Signed)
-----   Message from Patsey Berthold, NP sent at 06/20/2018 10:45 AM EST ----- Regarding: RE: amiodarone dose Let's decrease to 100mg  daily.  He has some bradycardia at baseline and hasn't had AF in years.  Thank you! Amber ----- Message ----- From: Leeroy Bock, [facility] Sent: 06/20/2018  10:00 AM EST To: Patsey Berthold, NP Subject: amiodarone dose                                Mancel Bale,  I got a call from one of the pharmacists who is managing this patient's warfarin. He asked for a refill on his amiodarone today and mentioned that he has continued to take 1 tab daily (200mg ) and never decreased his dose to 100mg  daily like you wanted him to back in August. Does look like his LFTs were stable when they were checked in Sept. What dose for his refill would you like [place] to send in?  Thanks, [employer]

## 2018-06-20 NOTE — Telephone Encounter (Signed)
Refill sent in for lower dose of amiodarone and pt is aware to take 1/2 tablet (total of 100mg ) daily.

## 2018-06-20 NOTE — Progress Notes (Signed)
INTERNAL MEDICINE TEACHING ATTENDING ADDENDUM - Ritchard Paragas M.D  Duration- indefinite, Indication- afib, INR- supratherapeutic. Agree with pharmacy recommendations as outlined in their note.     

## 2018-06-20 NOTE — Patient Instructions (Signed)
Patient instructed to take medications as defined in the Anti-coagulation Track section of this encounter.  Patient instructed to HOLD tomorrow's dose as he has already taken today's dose prior to the appointment. Patient instructed to hold his dose of warfarin tomorrow, Tuesday 12/3. Then take one (1) tablet of the 2.5mg  green-colored warfarin tablets on Fridays all other days, take only one-half (1/2) tablet. Repeat this regimen weekly. Patient verbalized understanding of these instructions.

## 2018-07-04 ENCOUNTER — Encounter: Payer: Self-pay | Admitting: Pharmacist

## 2018-07-04 ENCOUNTER — Ambulatory Visit (INDEPENDENT_AMBULATORY_CARE_PROVIDER_SITE_OTHER): Payer: Medicare Other | Admitting: Pharmacist

## 2018-07-04 DIAGNOSIS — I482 Chronic atrial fibrillation, unspecified: Secondary | ICD-10-CM

## 2018-07-04 DIAGNOSIS — I4891 Unspecified atrial fibrillation: Secondary | ICD-10-CM | POA: Diagnosis present

## 2018-07-04 DIAGNOSIS — Z7901 Long term (current) use of anticoagulants: Secondary | ICD-10-CM | POA: Diagnosis not present

## 2018-07-04 DIAGNOSIS — Z5181 Encounter for therapeutic drug level monitoring: Secondary | ICD-10-CM | POA: Diagnosis not present

## 2018-07-04 LAB — POCT INR: INR: 1.8 — AB (ref 2.0–3.0)

## 2018-07-04 NOTE — Progress Notes (Signed)
Anticoagulation Management [name] is a 72 y.o. male who reports to the clinic for monitoring of warfarin treatment.    Indication: atrial fibrillation and long term current use of anticoagulant.   Duration: indefinite Supervising physician: Joni Reining  Anticoagulation Clinic Visit History: Patient does not report signs/symptoms of bleeding or thromboembolism  Other recent changes: No diet, medications, lifestyle changes endorsed.  Anticoagulation Episode Summary    Current INR goal:   2.0-3.0  TTR:   68.4 % (5.6 y)  Next INR check:   07/25/2018  INR from last check:   1.8! (07/04/2018)  Weekly max warfarin dose:     Target end date:   Indefinite  INR check location:   Anticoagulation Clinic  Preferred lab:     Send INR reminders to:      Indications   Atrial fibrillation ([place]) [I48.91]       Comments:           No Known Allergies Prior to Admission medications   Medication Sig Start Date End Date Taking? Authorizing Provider  albuterol (PROAIR HFA) 108 (90 Base) MCG/ACT inhaler Inhale 1-2 puffs into the lungs every 6 (six) hours as needed for wheezing or shortness of breath. PLACE ON HOLD UNTIL PATIENT REQUESTS TO FILL Patient taking differently: Inhale 1-2 puffs into the lungs every 6 (six) hours as needed for wheezing or shortness of breath.  09/27/15  Yes Juluis Mire, MD  amiodarone (PACERONE) 200 MG tablet Take 0.5 tablets (100 mg total) by mouth daily. 06/20/18  Yes Seiler, Amber K, NP  carvedilol (COREG) 3.125 MG tablet TAKE 1 TABLET(3.125 MG) BY MOUTH DAILY 04/14/18  Yes [name], MD  fluticasone (FLOVENT HFA) 44 MCG/ACT inhaler Inhale 2 puffs into the lungs 2 (two) times daily.   Yes [provider]  folic acid (FOLVITE) 1 MG tablet Take 1 tablet (1 mg total) by mouth daily. 08/03/17  Yes Jule Ser, DO  hydroxyurea (HYDREA) 500 MG capsule Take 2 capsules (1,000 mg total) by mouth daily. MAY TAKE WITH FOOD TO MINIMIZE GI SIDE  EFFECTS 05/20/18  Yes [name], MD  ipratropium-albuterol (DUONEB) 0.5-2.5 (3) MG/3ML SOLN TAKE 3 MLS BY MOUTH EVERY 4 HOURS AS NEEDED FOR SHORTNESS OF BREATH 04/15/18  Yes [name], MD  SUBOXONE 8-2 MG FILM Place 1 Film under the tongue daily.  08/21/15  Yes [provider]  umeclidinium-vilanterol (ANORO ELLIPTA) 62.5-25 MCG/INH AEPB Inhale 1 puff into the lungs daily. 04/14/18  Yes Dorrell, Andree Elk, MD  warfarin (COUMADIN) 2.5 MG tablet Take 1 tablet (2.5 mg total) by mouth daily. 1 tablet Monday, Wednesday, Friday and 1/2 tablet the other days. 05/20/18  Yes [name], MD  furosemide (LASIX) 20 MG tablet Take 1 tablet (20 mg total) by mouth daily. 03/17/18 06/15/18  Patsey Berthold, NP  predniSONE (DELTASONE) 10 MG tablet Take 2 tablets (20mg ) for three days (starting 9/27, 9/28, 9/29). Then take one tablet (10mg ) for three days (9/30, 10/1, 10/2). Patient not taking: Reported on 07/04/2018 04/14/18   Dorrell, Andree Elk, MD   Past Medical History:  Diagnosis Date  . Acute lower GI bleeding 02/15/2017  . Atrial fibrillation /flutter    hx of flutter ablation 2/12  . Cardiomyopathy    EF55% 11/14<<35%   . CHF (congestive heart failure) ([place])   . COPD (chronic obstructive pulmonary disease) ([place])   . GERD (gastroesophageal reflux disease)   . Gout   . Hepatitis    "  think I had that once; a long time ago; from dirty needles I think" (12/14/2012)  . Hypertension   . Leukocytosis, unspecified 06/12/2013  . 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 Obesity   . Pneumonia X 1  . Primary polycythemia ([place]) 06/12/2013  . Renal insufficiency   . Substance abuse ([place])    alcohol; hx cocaine, heroin, crack use  . Syncope    Social History   Socioeconomic History  . Marital status: Single    Spouse name: Not on file  . Number of children: Not on file  . Years of education: Not on file  . Highest education level: Not on file  Occupational History  . Occupation:  Retired  Scientific laboratory technician  . Financial resource strain: Not on file  . Food insecurity:    Worry: Not on file    Inability: Not on file  . Transportation needs:    Medical: Not on file    Non-medical: Not on file  Tobacco Use  . Smoking status: Current Every Day Smoker    Packs/day: 0.10    Years: 50.00    Pack years: 5.00    Types: Cigarettes  . Smokeless tobacco: Never Used  Substance and Sexual Activity  . Alcohol use: Yes    Alcohol/week: 89.0 standard drinks    Types: 12 Cans of beer, 77 Shots of liquor per week    Comment: 02/15/2017 "a pint of vodka/day" plus 12 pack of beer/week"  . Drug use: Yes    Types: "Crack" cocaine, Cocaine, Heroin    Comment: 02/15/2017 "last used crack 4-5 years ago; last used heroin a couple years ago; currently denies any drug use-- on suboxone.   . Sexual activity: Not Currently  Lifestyle  . Physical activity:    Days per week: Not on file    Minutes per session: Not on file  . Stress: Not on file  Relationships  . Social connections:    Talks on phone: Not on file    Gets together: Not on file    Attends religious service: Not on file    Active member of club or organization: Not on file    Attends meetings of clubs or organizations: Not on file    Relationship status: Not on file  Other Topics Concern  . Not on file  Social History Narrative   Moved here from [place]. Lives with common law wife and grandchildren. He is retired from "general labor."   Family History  Problem Relation Age of Onset  . Diabetes Mother   . Hypertension Mother   . Cirrhosis Father   . Alcohol abuse Father   . Colon cancer Neg Hx   . Rectal cancer Neg Hx   . Stomach cancer Neg Hx     ASSESSMENT Recent Results: The most recent result is correlated with 10 mg per week: Lab Results  Component Value Date   INR 1.8 (A) 07/04/2018   INR 4.3 (A) 06/20/2018   INR 3.6 (A) 05/30/2018    Anticoagulation Dosing: Description   Take one (1) tablet of  your 2.5mg  green-colored warfarin tablets on Tuesdays and Fridays. All other days, take only one-half (1/2) tablet. Repeat this regimen weekly.     INR today: Subtherapeutic  PLAN Weekly dose was increased by 12% to 11.25 mg per week  Patient Instructions  Patient instructed to take medications as defined in the Anti-coagulation Track section of this encounter.  Patient instructed to take today's dose.  Patient instructed to  take one (1) tablet of your 2.5mg  green-colored warfarin tablets on Tuesdays and Fridays. All other days, take only one-half (1/2) tablet. Repeat this regimen weekly. Patient verbalized understanding of these instructions.     Patient advised to contact clinic or seek medical attention if signs/symptoms of bleeding or thromboembolism occur.  Patient verbalized understanding by repeating back information and was advised to contact me if further medication-related questions arise. Patient was also provided an information handout.  Follow-up Return in 3 weeks (on 07/25/2018) for Follow up INR at 0830.  Pennie Banter, PharmD, CPP  15 minutes spent face-to-face with the patient during the encounter. 50% of time spent on education. 50% of time was spent on fingerstick point of care INR sample collection, processing, results determination, dose adjustment and documentation in [email or web address]

## 2018-07-04 NOTE — Patient Instructions (Signed)
Patient instructed to take medications as defined in the Anti-coagulation Track section of this encounter.  Patient instructed to take today's dose.  Patient instructed to take one (1) tablet of your 2.5mg  green-colored warfarin tablets on Tuesdays and Fridays. All other days, take only one-half (1/2) tablet. Repeat this regimen weekly. Patient verbalized understanding of these instructions.

## 2018-07-22 ENCOUNTER — Other Ambulatory Visit: Payer: Self-pay

## 2018-07-22 ENCOUNTER — Encounter (HOSPITAL_COMMUNITY): Payer: Self-pay

## 2018-07-22 ENCOUNTER — Emergency Department (HOSPITAL_COMMUNITY): Payer: Medicare Other

## 2018-07-22 ENCOUNTER — Emergency Department (HOSPITAL_COMMUNITY)
Admission: EM | Admit: 2018-07-22 | Discharge: 2018-07-22 | Disposition: A | Discharge status: Home / Self Care | Payer: Medicare Other | Attending: Emergency Medicine | Admitting: Emergency Medicine

## 2018-07-22 DIAGNOSIS — I130 Hypertensive heart and chronic kidney disease with heart failure and stage 1 through stage 4 chronic kidney disease, or unspecified chronic kidney disease: Secondary | ICD-10-CM | POA: Diagnosis not present

## 2018-07-22 DIAGNOSIS — F17210 Nicotine dependence, cigarettes, uncomplicated: Secondary | ICD-10-CM | POA: Diagnosis not present

## 2018-07-22 DIAGNOSIS — N189 Chronic kidney disease, unspecified: Secondary | ICD-10-CM | POA: Diagnosis not present

## 2018-07-22 DIAGNOSIS — Z79899 Other long term (current) drug therapy: Secondary | ICD-10-CM | POA: Insufficient documentation

## 2018-07-22 DIAGNOSIS — I509 Heart failure, unspecified: Secondary | ICD-10-CM | POA: Insufficient documentation

## 2018-07-22 DIAGNOSIS — J441 Chronic obstructive pulmonary disease with (acute) exacerbation: Secondary | ICD-10-CM | POA: Insufficient documentation

## 2018-07-22 DIAGNOSIS — R0602 Shortness of breath: Secondary | ICD-10-CM | POA: Diagnosis present

## 2018-07-22 LAB — CBC WITH DIFFERENTIAL/PLATELET
ABS IMMATURE GRANULOCYTES: 0.02 10*3/uL (ref 0.00–0.07)
BASOS PCT: 0 %
Basophils Absolute: 0 10*3/uL (ref 0.0–0.1)
EOS PCT: 0 %
Eosinophils Absolute: 0 10*3/uL (ref 0.0–0.5)
HEMATOCRIT: 41.1 % (ref 39.0–52.0)
Hemoglobin: 14 g/dL (ref 13.0–17.0)
IMMATURE GRANULOCYTES: 0 %
LYMPHS PCT: 10 %
Lymphs Abs: 0.6 10*3/uL — ABNORMAL LOW (ref 0.7–4.0)
MCH: 42.8 pg — AB (ref 26.0–34.0)
MCHC: 34.1 g/dL (ref 30.0–36.0)
MCV: 125.7 fL — AB (ref 80.0–100.0)
MONO ABS: 0.3 10*3/uL (ref 0.1–1.0)
MONOS PCT: 5 %
Neutro Abs: 5.1 10*3/uL (ref 1.7–7.7)
Neutrophils Relative %: 85 %
PLATELETS: 151 10*3/uL (ref 150–400)
RBC: 3.27 MIL/uL — AB (ref 4.22–5.81)
RDW: 13.5 % (ref 11.5–15.5)
WBC: 6.1 10*3/uL (ref 4.0–10.5)
nRBC: 0 % (ref 0.0–0.2)

## 2018-07-22 LAB — BASIC METABOLIC PANEL
ANION GAP: 14 (ref 5–15)
BUN: 25 mg/dL — AB (ref 8–23)
CO2: 27 mmol/L (ref 22–32)
Calcium: 8.5 mg/dL — ABNORMAL LOW (ref 8.9–10.3)
Chloride: 104 mmol/L (ref 98–111)
Creatinine, Ser: 1.51 mg/dL — ABNORMAL HIGH (ref 0.61–1.24)
GFR calc Af Amer: 53 mL/min — ABNORMAL LOW (ref >60)
GFR, EST NON AFRICAN AMERICAN: 46 mL/min — AB (ref >60)
GLUCOSE: 143 mg/dL — AB (ref 70–99)
POTASSIUM: 2.8 mmol/L — AB (ref 3.5–5.1)
Sodium: 145 mmol/L (ref 135–145)

## 2018-07-22 MED ORDER — ALBUTEROL SULFATE (2.5 MG/3ML) 0.083% IN NEBU: 5.0000 mg | INHALATION_SOLUTION | Freq: Once | RESPIRATORY_TRACT | Status: DC

## 2018-07-22 MED ORDER — POTASSIUM CHLORIDE CRYS ER 20 MEQ PO TBCR
60.0000 meq | EXTENDED_RELEASE_TABLET | Freq: Once | ORAL | Status: AC
  Administered 2018-07-22: 60 meq via ORAL
  Filled 2018-07-22: qty 3

## 2018-07-22 MED ORDER — SODIUM CHLORIDE 0.9 % IV SOLN: INTRAVENOUS | Status: DC

## 2018-07-22 MED ORDER — PREDNISONE 50 MG PO TABS: ORAL_TABLET | ORAL | 0 refills | Status: DC

## 2018-07-22 NOTE — ED Provider Notes (Signed)
[facility] DEPT Provider Note   CSN: [id number] Arrival date & time: 07/22/18  1521     History   Chief Complaint Chief Complaint  Patient presents with  . Shortness of Breath    HPI [name] is a 73 y.o. male.  This is a 73 year old male with history of CHF and COPD presents with increasing dyspnea after walking from a bus stop to his house.  States he was in his baseline state of health before he did this.  Had some cough and wheezing.  Did not have his inhaler.  Had no chest pain at all.  Called EMS and was given albuterol 10 mg with Atrovent as well as Solu-Medrol and 2 g of mag citrate.  He is not on chronic oxygen.  EMS placed patient on 2 L he feels better at this time.  Feels much better after the treatment.     Past Medical History:  Diagnosis Date  . Acute lower GI bleeding 02/15/2017  . Atrial fibrillation /flutter    hx of flutter ablation 2/12  . Cardiomyopathy    EF55% 11/14<<35%   . CHF (congestive heart failure) ([place])   . COPD (chronic obstructive pulmonary disease) ([place])   . GERD (gastroesophageal reflux disease)   . Gout   . Hepatitis    "think I had that once; a long time ago; from dirty needles I think" (12/14/2012)  . Hypertension   . Leukocytosis, unspecified 06/12/2013  . 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 Obesity   . Pneumonia X 1  . Primary polycythemia ([place]) 06/12/2013  . Renal insufficiency   . Substance abuse ([place])    alcohol; hx cocaine, heroin, crack use  . Syncope     Patient Active Problem List   Diagnosis Date Noted  . COPD exacerbation ([place]) 04/12/2018  . Elevated troponin 04/12/2018  . Long term current use of amiodarone 01/24/2018  . GI bleed 02/15/2017  . Pancytopenia, acquired ([place]) 11/26/2016  . Tubular adenoma 11/25/2016  . Gout 11/02/2016  . Preventive measure 05/25/2016  . Hepatitis C antibody test positive   . Hematuria 11/09/2014  . Acute kidney injury  ([place]) 11/01/2014  . Leukopenia due to antineoplastic chemotherapy ([place]) 08/24/2014  . Thrombocytopenia due to drugs 07/25/2014  . Encounter for screening colonoscopy 07/02/2014  . Chronic anticoagulation 07/02/2014  . Prolonged QT interval 06/05/2014  . Sinus bradycardia 06/05/2014  . Opioid dependence ([place]) 06/05/2014  . Diastolic dysfunction 67/89/3810  . COPD (chronic obstructive pulmonary disease) ([place]) 05/30/2014  . Health care maintenance 05/24/2014  . Anemia due to antineoplastic chemotherapy 04/24/2014  . Thyroid mass 08/26/2013  . Syncope 08/22/2013  . 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 Polycythemia vera ([place]) 01/03/2013  . Malnutrition of moderate degree ([place]) 12/15/2012  . Tobacco abuse 09/15/2012  . Heart failure with preserved left ventricular function (HFpEF) ([place])   . Alcoholism ([place]) 09/30/2010  . Hypertension 09/30/2010  . Atrial fibrillation [facility])     Past Surgical History:  Procedure Laterality Date  . CARDIAC ELECTROPHYSIOLOGY MAPPING AND ABLATION  08/2010   [name] 09/07/2010 (12/14/2012)  . COLONOSCOPY     15-20 years ago had colon in [place]  . EXCISIONAL HEMORRHOIDECTOMY  1970's  . LOOP RECORDER IMPLANT N/A 08/23/2013   Procedure: LOOP RECORDER IMPLANT;  Surgeon: Deboraha Sprang, MD;  Location: [facility] CATH LAB;  Service: Cardiovascular;  Laterality: N/A;  . MULTIPLE EXTRACTIONS WITH ALVEOLOPLASTY Bilateral 01/24/2016   Procedure: MULTIPLE EXTRACTION WITH ALVEOLOPLASTY BILATERAL;  Surgeon:  Diona Browner, DDS;  Location: [facility];  Service: Oral Surgery;  Laterality: Bilateral;  . MULTIPLE TOOTH EXTRACTIONS  01/24/2016   MULTIPLE EXTRACTION WITH ALVEOLOPLASTY BILATERAL (Bilateral)        Home Medications    Prior to Admission medications   Medication Sig Start Date End Date Taking? Authorizing Provider  albuterol (PROAIR HFA) 108 (90 Base) MCG/ACT inhaler Inhale 1-2 puffs into the lungs every 6 (six) hours as needed for wheezing or shortness of breath. PLACE  ON HOLD UNTIL PATIENT REQUESTS TO FILL Patient taking differently: Inhale 1-2 puffs into the lungs every 6 (six) hours as needed for wheezing or shortness of breath.  09/27/15  Yes Juluis Mire, MD  amiodarone (PACERONE) 200 MG tablet Take 0.5 tablets (100 mg total) by mouth daily. 06/20/18  Yes Seiler, Amber K, NP  carvedilol (COREG) 6.25 MG tablet Take 6.25 mg by mouth daily.  04/29/18  Yes [provider]  fluticasone (FLOVENT HFA) 44 MCG/ACT inhaler Inhale 2 puffs into the lungs 2 (two) times daily as needed (sob).    Yes [provider]  folic acid (FOLVITE) 1 MG tablet Take 1 tablet (1 mg total) by mouth daily. 08/03/17  Yes Jule Ser, DO  furosemide (LASIX) 20 MG tablet Take 20 mg by mouth daily.   Yes [provider]  hydroxyurea (HYDREA) 500 MG capsule Take 2 capsules (1,000 mg total) by mouth daily. MAY TAKE WITH FOOD TO MINIMIZE GI SIDE EFFECTS 05/20/18  Yes [name], MD  SUBOXONE 8-2 MG FILM Place 1 Film under the tongue daily.  08/21/15  Yes [provider]  umeclidinium-vilanterol (ANORO ELLIPTA) 62.5-25 MCG/INH AEPB Inhale 1 puff into the lungs daily. Patient taking differently: Inhale 1 puff into the lungs daily as needed (sob and wheezing).  04/14/18  Yes Dorrell, Andree Elk, MD  warfarin (COUMADIN) 2.5 MG tablet Take 1 tablet (2.5 mg total) by mouth daily. 1 tablet Monday, Wednesday, Friday and 1/2 tablet the other days. Patient taking differently: Take 1.25-2.5 mg by mouth as directed. Take 1 tablet (2.5 mg) on Tues & Friday and Take 0.5 tablet (1.25 mg) all other days. 05/20/18  Yes [name], MD  carvedilol (COREG) 3.125 MG tablet TAKE 1 TABLET(3.125 MG) BY MOUTH DAILY Patient not taking: Reported on 07/22/2018 04/14/18   [name], MD  furosemide (LASIX) 20 MG tablet Take 1 tablet (20 mg total) by mouth daily. 03/17/18 06/15/18  Seiler, Amber K, NP  ipratropium-albuterol (DUONEB) 0.5-2.5 (3) MG/3ML SOLN TAKE 3 MLS BY MOUTH EVERY 4  HOURS AS NEEDED FOR SHORTNESS OF BREATH Patient taking differently: Inhale 3 mLs into the lungs every 6 (six) hours as needed (sob and wheezing).  04/15/18   [name], MD  predniSONE (DELTASONE) 10 MG tablet Take 2 tablets (20mg ) for three days (starting 9/27, 9/28, 9/29). Then take one tablet (10mg ) for three days (9/30, 10/1, 10/2). Patient not taking: Reported on 07/04/2018 04/14/18   Dorrell, Andree Elk, MD    Family History Family History  Problem Relation Age of Onset  . Diabetes Mother   . Hypertension Mother   . Cirrhosis Father   . Alcohol abuse Father   . Colon cancer Neg Hx   . Rectal cancer Neg Hx   . Stomach cancer Neg Hx     Social History Social History   Tobacco Use  . Smoking status: Current Every Day Smoker    Packs/day: 0.10    Years: 50.00  Pack years: 5.00    Types: Cigarettes  . Smokeless tobacco: Never Used  Substance Use Topics  . Alcohol use: Yes    Alcohol/week: 89.0 standard drinks    Types: 12 Cans of beer, 77 Shots of liquor per week    Comment: 02/15/2017 "a pint of vodka/day" plus 12 pack of beer/week"  . Drug use: Yes    Types: "Crack" cocaine, Cocaine, Heroin    Comment: 02/15/2017 "last used crack 4-5 years ago; last used heroin a couple years ago; currently denies any drug use-- on suboxone.      Allergies   Patient has no known allergies.   Review of Systems Review of Systems  All other systems reviewed and are negative.    Physical Exam Updated Vital Signs BP 119/74 (BP Location: Left Arm)   Pulse 100   Temp 97.9 F (36.6 C) (Oral)   Resp 20   SpO2 95%   Physical Exam Vitals signs and nursing note reviewed.  Constitutional:      General: He is not in acute distress.    Appearance: Normal appearance. He is well-developed. He is not toxic-appearing.  HENT:     Head: Normocephalic and atraumatic.  Eyes:     General: Lids are normal.     Conjunctiva/sclera: Conjunctivae normal.     Pupils: Pupils are equal, round,  and reactive to light.  Neck:     Musculoskeletal: Normal range of motion and neck supple.     Thyroid: No thyroid mass.     Trachea: No tracheal deviation.  Cardiovascular:     Rate and Rhythm: Normal rate and regular rhythm.     Heart sounds: Normal heart sounds. No murmur. No gallop.   Pulmonary:     Effort: Pulmonary effort is normal. No respiratory distress.     Breath sounds: No stridor. Examination of the right-upper field reveals decreased breath sounds and wheezing. Examination of the left-upper field reveals decreased breath sounds and wheezing. Decreased breath sounds and wheezing present. No rhonchi or rales.  Abdominal:     General: Bowel sounds are normal. There is no distension.     Palpations: Abdomen is soft.     Tenderness: There is no abdominal tenderness. There is no rebound.  Musculoskeletal: Normal range of motion.        General: No tenderness.  Skin:    General: Skin is warm and dry.     Findings: No abrasion or rash.  Neurological:     Mental Status: He is alert and oriented to person, place, and time.     GCS: GCS eye subscore is 4. GCS verbal subscore is 5. GCS motor subscore is 6.     Cranial Nerves: No cranial nerve deficit.     Sensory: No sensory deficit.  Psychiatric:        Speech: Speech normal.        Behavior: Behavior normal.      ED Treatments / Results  Labs (all labs ordered are listed, but only abnormal results are displayed) Labs Reviewed  CBC WITH DIFFERENTIAL/PLATELET - Abnormal; Notable for the following components:      Result Value   RBC 3.27 (*)    MCV 125.7 (*)    MCH 42.8 (*)    All other components within normal limits  BASIC METABOLIC PANEL    EKG EKG Interpretation  Date/Time:  Friday July 22 2018 15:23:56 EST Ventricular Rate:  91 PR Interval:    QRS Duration: 112 QT  Interval:  432 QTC Calculation: 532 R Axis:   12 Text Interpretation:  Sinus rhythm Multiple premature complexes, vent & supraven Borderline  intraventricular conduction delay Abnormal R-wave progression, early transition Borderline ST depression, lateral leads Prolonged QT interval Baseline wander in lead(s) III aVR V6 Confirmed by Lacretia Leigh ([id number]) on 07/22/2018 4:11:29 PM   Radiology No results found.  Procedures Procedures (including critical care time)  Medications Ordered in ED Medications  albuterol (PROVENTIL) (2.5 MG/3ML) 0.083% nebulizer solution 5 mg (has no administration in time range)  0.9 %  sodium chloride infusion (has no administration in time range)     Initial Impression / Assessment and Plan / ED Course  I have reviewed the triage vital signs and the nursing notes.  Pertinent labs & imaging results that were available during my care of the patient were reviewed by me and considered in my medical decision making (see chart for details).     Patient received multiple therapies prior to arrival here.  States his breathing does feel somewhat improved.  Potassium is 2.8 here and was given 60 mEq of oral potassium.  Patient was monitored here in at time of discharge was ambulatory without being symptomatic.  Placed on a short course of prednisone urged him to use his inhaler as needed  Final Clinical Impressions(s) / ED Diagnoses   Final diagnoses:  None    ED Discharge Orders    None       Lacretia Leigh, MD 07/22/18 1931

## 2018-07-22 NOTE — ED Notes (Signed)
Pt ambulated through department with oxygen saturations dropping at lowest to 91% but mostly maintaining at 94% throughout duration of walk. HR got no higher than 111bpm. Pt stated "I feel good right now and I am not having any problems breathing".

## 2018-07-22 NOTE — ED Notes (Signed)
Bed: DU37 Expected date:  Expected time:  Means of arrival:  Comments: EMS-respiratory distress

## 2018-07-22 NOTE — ED Triage Notes (Signed)
Patient BIB EMS from home. Patient has COPD and asthma. Patient reports he got off the bus at the wrong bus stop and had to walk home, which exacerbated his symptoms. Patient received 10 Albuterol, 0.5mg  Atrovent, 125mg  Solumedrol, 2Gm Mag Sulfate by EMS. 20g Left Hand PIV placed by EMS. Bilateral wheezes throughout heard by EMS. Patient speaking in full sentences in triage.

## 2018-07-25 ENCOUNTER — Encounter: Payer: Self-pay | Admitting: Pharmacist

## 2018-07-25 ENCOUNTER — Ambulatory Visit (INDEPENDENT_AMBULATORY_CARE_PROVIDER_SITE_OTHER): Payer: Medicare Other | Admitting: Pharmacist

## 2018-07-25 DIAGNOSIS — I4891 Unspecified atrial fibrillation: Secondary | ICD-10-CM | POA: Diagnosis present

## 2018-07-25 DIAGNOSIS — Z7901 Long term (current) use of anticoagulants: Secondary | ICD-10-CM

## 2018-07-25 DIAGNOSIS — Z5181 Encounter for therapeutic drug level monitoring: Secondary | ICD-10-CM | POA: Diagnosis not present

## 2018-07-25 DIAGNOSIS — I482 Chronic atrial fibrillation, unspecified: Secondary | ICD-10-CM

## 2018-07-25 LAB — POCT INR: INR: 2.1 (ref 2.0–3.0)

## 2018-07-25 NOTE — Patient Instructions (Signed)
Patient instructed to take medications as defined in the Anti-coagulation Track section of this encounter.  Patient instructed to take today's dose. (has already taken for the day).  Patient instructed to take one (1) tablet of your 2.5mg  green-colored warfarin tablets on Tuesdays , Thursdays and  Saturdays. All other days, take only one-half (1/2) tablet. Repeat this regimen weekly. Patient verbalized understanding of these instructions.

## 2018-07-25 NOTE — Progress Notes (Signed)
Anticoagulation Management [name] is a 73 y.o. male who reports to the clinic for monitoring of warfarin treatment.    Indication: atrial fibrillation .   Duration: indefinite Supervising physician: [facility] Visit History: Patient does not report signs/symptoms of bleeding or thromboembolism  Other recent changes: No diet, medications, lifestyle changes.  Anticoagulation Episode Summary    Current INR goal:   2.0-3.0  TTR:   68.0 % (5.7 y)  Next INR check:   08/22/2018  INR from last check:   2.1 (07/25/2018)  Weekly max warfarin dose:     Target end date:   Indefinite  INR check location:   Anticoagulation Clinic  Preferred lab:     Send INR reminders to:      Indications   Atrial fibrillation ([facility]) [I48.91]       Comments:           No Known Allergies Prior to Admission medications   Medication Sig Start Date End Date Taking? Authorizing Provider  albuterol (PROAIR HFA) 108 (90 Base) MCG/ACT inhaler Inhale 1-2 puffs into the lungs every 6 (six) hours as needed for wheezing or shortness of breath. PLACE ON HOLD UNTIL PATIENT REQUESTS TO FILL Patient taking differently: Inhale 1-2 puffs into the lungs every 6 (six) hours as needed for wheezing or shortness of breath.  09/27/15  Yes Juluis Mire, MD  amiodarone (PACERONE) 200 MG tablet Take 0.5 tablets (100 mg total) by mouth daily. 06/20/18  Yes Seiler, Amber K, NP  carvedilol (COREG) 3.125 MG tablet TAKE 1 TABLET(3.125 MG) BY MOUTH DAILY 04/14/18  Yes [name], MD  carvedilol (COREG) 6.25 MG tablet Take 6.25 mg by mouth daily.  04/29/18  Yes [provider]  fluticasone (FLOVENT HFA) 44 MCG/ACT inhaler Inhale 2 puffs into the lungs 2 (two) times daily as needed (sob).    Yes [provider]  folic acid (FOLVITE) 1 MG tablet Take 1 tablet (1 mg total) by mouth daily. 08/03/17  Yes Jule Ser, DO  furosemide (LASIX) 20 MG tablet Take 20 mg by mouth daily.    Yes [provider]  hydroxyurea (HYDREA) 500 MG capsule Take 2 capsules (1,000 mg total) by mouth daily. MAY TAKE WITH FOOD TO MINIMIZE GI SIDE EFFECTS 05/20/18  Yes [name], MD  ipratropium-albuterol (DUONEB) 0.5-2.5 (3) MG/3ML SOLN TAKE 3 MLS BY MOUTH EVERY 4 HOURS AS NEEDED FOR SHORTNESS OF BREATH Patient taking differently: Inhale 3 mLs into the lungs every 6 (six) hours as needed (sob and wheezing).  04/15/18  Yes [name], MD  predniSONE (DELTASONE) 10 MG tablet Take 2 tablets (20mg ) for three days (starting 9/27, 9/28, 9/29). Then take one tablet (10mg ) for three days (9/30, 10/1, 10/2). 04/14/18  Yes Dorrell, Andree Elk, MD  predniSONE (DELTASONE) 50 MG tablet 1 p.o. daily x5 07/22/18  Yes Lacretia Leigh, MD  SUBOXONE 8-2 MG FILM Place 1 Film under the tongue daily.  08/21/15  Yes [provider]  umeclidinium-vilanterol (ANORO ELLIPTA) 62.5-25 MCG/INH AEPB Inhale 1 puff into the lungs daily. Patient taking differently: Inhale 1 puff into the lungs daily as needed (sob and wheezing).  04/14/18  Yes Dorrell, Andree Elk, MD  warfarin (COUMADIN) 2.5 MG tablet Take 1 tablet (2.5 mg total) by mouth daily. 1 tablet Monday, Wednesday, Friday and 1/2 tablet the other days. Patient taking differently: Take 1.25-2.5 mg by mouth as directed. Take 1 tablet (2.5 mg) on Tues & Friday and Take  0.5 tablet (1.25 mg) all other days. 05/20/18  Yes [name], MD  furosemide (LASIX) 20 MG tablet Take 1 tablet (20 mg total) by mouth daily. 03/17/18 06/15/18  Patsey Berthold, NP   Past Medical History:  Diagnosis Date  . Acute lower GI bleeding 02/15/2017  . Atrial fibrillation /flutter    hx of flutter ablation 2/12  . Cardiomyopathy    EF55% 11/14<<35%   . CHF (congestive heart failure) ([place])   . COPD (chronic obstructive pulmonary disease) ([place])   . GERD (gastroesophageal reflux disease)   . Gout   . Hepatitis    "think I had that once; a long time ago; from dirty needles I  think" (12/14/2012)  . Hypertension   . Leukocytosis, unspecified 06/12/2013  . 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 Obesity   . Pneumonia X 1  . Primary polycythemia ([place]) 06/12/2013  . Renal insufficiency   . Substance abuse ([place])    alcohol; hx cocaine, heroin, crack use  . Syncope    Social History   Socioeconomic History  . Marital status: Single    Spouse name: Not on file  . Number of children: Not on file  . Years of education: Not on file  . Highest education level: Not on file  Occupational History  . Occupation: Retired  Scientific laboratory technician  . Financial resource strain: Not on file  . Food insecurity:    Worry: Not on file    Inability: Not on file  . Transportation needs:    Medical: Not on file    Non-medical: Not on file  Tobacco Use  . Smoking status: Current Every Day Smoker    Packs/day: 0.10    Years: 50.00    Pack years: 5.00    Types: Cigarettes  . Smokeless tobacco: Never Used  Substance and Sexual Activity  . Alcohol use: Yes    Alcohol/week: 89.0 standard drinks    Types: 12 Cans of beer, 77 Shots of liquor per week    Comment: 02/15/2017 "a pint of vodka/day" plus 12 pack of beer/week"  . Drug use: Yes    Types: "Crack" cocaine, Cocaine, Heroin    Comment: 02/15/2017 "last used crack 4-5 years ago; last used heroin a couple years ago; currently denies any drug use-- on suboxone.   . Sexual activity: Not Currently  Lifestyle  . Physical activity:    Days per week: Not on file    Minutes per session: Not on file  . Stress: Not on file  Relationships  . Social connections:    Talks on phone: Not on file    Gets together: Not on file    Attends religious service: Not on file    Active member of club or organization: Not on file    Attends meetings of clubs or organizations: Not on file    Relationship status: Not on file  Other Topics Concern  . Not on file  Social History Narrative   Moved here from [place]. Lives with common law  wife and grandchildren. He is retired from "general labor."   Family History  Problem Relation Age of Onset  . Diabetes Mother   . Hypertension Mother   . Cirrhosis Father   . Alcohol abuse Father   . Colon cancer Neg Hx   . Rectal cancer Neg Hx   . Stomach cancer Neg Hx     ASSESSMENT Recent Results: The most recent result is correlated with 11.25 mg per week: Lab  Results  Component Value Date   INR 2.1 07/25/2018   INR 1.8 (A) 07/04/2018   INR 4.3 (A) 06/20/2018    Anticoagulation Dosing: Description   Take one (1) tablet of your 2.5mg  green-colored warfarin tablets on Tuesdays , Thursdays and  Saturdays. All other days, take only one-half (1/2) tablet. Repeat this regimen weekly.     INR today: Therapeutic  PLAN Weekly dose was increased by 11% to 12.5 mg per week  Patient Instructions  Patient instructed to take medications as defined in the Anti-coagulation Track section of this encounter.  Patient instructed to take today's dose. (has already taken for the day).  Patient instructed to take one (1) tablet of your 2.5mg  green-colored warfarin tablets on Tuesdays , Thursdays and  Saturdays. All other days, take only one-half (1/2) tablet. Repeat this regimen weekly. Patient verbalized understanding of these instructions.     Patient advised to contact clinic or seek medical attention if signs/symptoms of bleeding or thromboembolism occur.  Patient verbalized understanding by repeating back information and was advised to contact me if further medication-related questions arise. Patient was also provided an information handout.  Follow-up Return in 4 weeks (on 08/22/2018) for Follow up INR at 0845h.  Pennie Banter , PharmD, CPP 15 minutes spent face-to-face with the patient during the encounter. 50% of time spent on education. 50% of time was spent on fingerstick point of care INR sample collection, processing, results determination, dose adjustment and documentation in  http://www.kim.net/.

## 2018-07-27 NOTE — Progress Notes (Signed)
I reviewed Dr. Gladstone Pih note.  Patient is on [facility] for Afib.  His INR was at goal, low side of goal.

## 2018-08-15 ENCOUNTER — Other Ambulatory Visit: Payer: Self-pay

## 2018-08-15 ENCOUNTER — Emergency Department (HOSPITAL_COMMUNITY): Payer: Medicare Other

## 2018-08-15 ENCOUNTER — Emergency Department (HOSPITAL_COMMUNITY)
Admission: EM | Admit: 2018-08-15 | Discharge: 2018-08-16 | Disposition: A | Discharge status: Home / Self Care | Payer: Medicare Other | Attending: Emergency Medicine | Admitting: Emergency Medicine

## 2018-08-15 ENCOUNTER — Encounter (HOSPITAL_COMMUNITY): Payer: Self-pay

## 2018-08-15 DIAGNOSIS — Z5321 Procedure and treatment not carried out due to patient leaving prior to being seen by health care provider: Secondary | ICD-10-CM | POA: Diagnosis not present

## 2018-08-15 DIAGNOSIS — R0602 Shortness of breath: Secondary | ICD-10-CM | POA: Insufficient documentation

## 2018-08-15 LAB — CBC WITH DIFFERENTIAL/PLATELET
Abs Immature Granulocytes: 0.01 10*3/uL (ref 0.00–0.07)
BASOS PCT: 0 %
Basophils Absolute: 0 10*3/uL (ref 0.0–0.1)
Eosinophils Absolute: 0.1 10*3/uL (ref 0.0–0.5)
Eosinophils Relative: 2 %
HCT: 39.8 % (ref 39.0–52.0)
Hemoglobin: 13.2 g/dL (ref 13.0–17.0)
Immature Granulocytes: 0 %
Lymphocytes Relative: 29 %
Lymphs Abs: 0.9 10*3/uL (ref 0.7–4.0)
MCH: 41.9 pg — ABNORMAL HIGH (ref 26.0–34.0)
MCHC: 33.2 g/dL (ref 30.0–36.0)
MCV: 126.3 fL — ABNORMAL HIGH (ref 80.0–100.0)
Monocytes Absolute: 0.5 10*3/uL (ref 0.1–1.0)
Monocytes Relative: 15 %
Neutro Abs: 1.7 10*3/uL (ref 1.7–7.7)
Neutrophils Relative %: 54 %
PLATELETS: 251 10*3/uL (ref 150–400)
RBC: 3.15 MIL/uL — AB (ref 4.22–5.81)
RDW: 13.7 % (ref 11.5–15.5)
WBC: 3.2 10*3/uL — ABNORMAL LOW (ref 4.0–10.5)
nRBC: 0 % (ref 0.0–0.2)

## 2018-08-15 LAB — COMPREHENSIVE METABOLIC PANEL
ALT: 13 U/L (ref 0–44)
AST: 20 U/L (ref 15–41)
Albumin: 3.2 g/dL — ABNORMAL LOW (ref 3.5–5.0)
Alkaline Phosphatase: 92 U/L (ref 38–126)
Anion gap: 9 (ref 5–15)
BUN: 15 mg/dL (ref 8–23)
CO2: 29 mmol/L (ref 22–32)
Calcium: 8.3 mg/dL — ABNORMAL LOW (ref 8.9–10.3)
Chloride: 104 mmol/L (ref 98–111)
Creatinine, Ser: 1.36 mg/dL — ABNORMAL HIGH (ref 0.61–1.24)
GFR calc Af Amer: >60 mL/min (ref >60)
GFR, EST NON AFRICAN AMERICAN: 52 mL/min — AB (ref >60)
Glucose, Bld: 107 mg/dL — ABNORMAL HIGH (ref 70–99)
POTASSIUM: 3.8 mmol/L (ref 3.5–5.1)
Sodium: 142 mmol/L (ref 135–145)
TOTAL PROTEIN: 6.3 g/dL — AB (ref 6.5–8.1)
Total Bilirubin: 0.8 mg/dL (ref 0.3–1.2)

## 2018-08-15 LAB — I-STAT TROPONIN, ED: Troponin i, poc: 0.01 ng/mL (ref 0.00–0.08)

## 2018-08-15 LAB — BRAIN NATRIURETIC PEPTIDE: B Natriuretic Peptide: 385 pg/mL — ABNORMAL HIGH (ref 0.0–100.0)

## 2018-08-15 NOTE — ED Notes (Signed)
PT. Left without being seen by provider.

## 2018-08-15 NOTE — ED Triage Notes (Addendum)
Pt endorses cough, nasal congestion, shob x 2 days. Has been around family who has the same. Also has hx of heart failure and complains of peripheral edema. Afebrile. VSS

## 2018-08-22 ENCOUNTER — Ambulatory Visit (INDEPENDENT_AMBULATORY_CARE_PROVIDER_SITE_OTHER): Payer: Medicare Other | Admitting: Pharmacist

## 2018-08-22 DIAGNOSIS — I4891 Unspecified atrial fibrillation: Secondary | ICD-10-CM | POA: Diagnosis not present

## 2018-08-22 DIAGNOSIS — Z7901 Long term (current) use of anticoagulants: Secondary | ICD-10-CM | POA: Diagnosis not present

## 2018-08-22 DIAGNOSIS — Z5181 Encounter for therapeutic drug level monitoring: Secondary | ICD-10-CM

## 2018-08-22 DIAGNOSIS — I4811 Longstanding persistent atrial fibrillation: Secondary | ICD-10-CM

## 2018-08-22 LAB — POCT INR: INR: 1.4 — AB (ref 2.0–3.0)

## 2018-08-22 NOTE — Patient Instructions (Signed)
Patient instructed to take medications as defined in the Anti-coagulation Track section of this encounter.  Patient instructed to take today's dose.  Patient verbalized understanding of these instructions.  Patient instructed to take one (1) tablet of your 2.5mg  green-colored warfarin tablets every day of the week, except take only one-half (1/2) tablet on [place]. Repeat this regimen weekly.

## 2018-08-22 NOTE — Progress Notes (Signed)
INTERNAL MEDICINE TEACHING ATTENDING ADDENDUM - Lucious Groves, DO Duration- indefinate, Indication- afib, INR-  Lab Results  Component Value Date   INR 1.4 (A) 08/22/2018  . Agree with pharmacy recommendations as outlined in their note.

## 2018-08-22 NOTE — Progress Notes (Signed)
Anticoagulation Management [name] is a 73 y.o. male who reports to the clinic for monitoring of warfarin treatment.    Indication: atrial fibrillation and long-term (current) use of anticoagulation  Duration: indefinite Supervising physician: Joni Reining  Anticoagulation Clinic Visit History: Patient does not report signs/symptoms of bleeding or thromboembolism at this time. Other recent changes: No diet, medications, lifestyle endorsed by patient.  Anticoagulation Episode Summary    Current INR goal:   2.0-3.0  TTR:   67.3 % (5.8 y)  Next INR check:   08/29/2018  INR from last check:   1.4! (08/22/2018)  Weekly max warfarin dose:     Target end date:   Indefinite  INR check location:   Anticoagulation Clinic  Preferred lab:     Send INR reminders to:      Indications   Atrial fibrillation ([place]) [I48.91]       Comments:           No Known Allergies Prior to Admission medications   Medication Sig Start Date End Date Taking? Authorizing Provider  albuterol (PROAIR HFA) 108 (90 Base) MCG/ACT inhaler Inhale 1-2 puffs into the lungs every 6 (six) hours as needed for wheezing or shortness of breath. PLACE ON HOLD UNTIL PATIENT REQUESTS TO FILL Patient taking differently: Inhale 1-2 puffs into the lungs every 6 (six) hours as needed for wheezing or shortness of breath.  09/27/15  Yes Juluis Mire, MD  amiodarone (PACERONE) 200 MG tablet Take 0.5 tablets (100 mg total) by mouth daily. 06/20/18  Yes Seiler, Amber K, NP  carvedilol (COREG) 3.125 MG tablet TAKE 1 TABLET(3.125 MG) BY MOUTH DAILY 04/14/18  Yes [name], MD  carvedilol (COREG) 6.25 MG tablet Take 6.25 mg by mouth daily.  04/29/18  Yes [provider]  fluticasone (FLOVENT HFA) 44 MCG/ACT inhaler Inhale 2 puffs into the lungs 2 (two) times daily as needed (sob).    Yes [provider]  folic acid (FOLVITE) 1 MG tablet Take 1 tablet (1 mg total) by mouth daily. 08/03/17  Yes Jule Ser, DO  furosemide (LASIX) 20 MG tablet Take 20 mg by mouth daily.   Yes [provider]  hydroxyurea (HYDREA) 500 MG capsule Take 2 capsules (1,000 mg total) by mouth daily. MAY TAKE WITH FOOD TO MINIMIZE GI SIDE EFFECTS 05/20/18  Yes [name], MD  ipratropium-albuterol (DUONEB) 0.5-2.5 (3) MG/3ML SOLN TAKE 3 MLS BY MOUTH EVERY 4 HOURS AS NEEDED FOR SHORTNESS OF BREATH Patient taking differently: Inhale 3 mLs into the lungs every 6 (six) hours as needed (sob and wheezing).  04/15/18  Yes [name], MD  predniSONE (DELTASONE) 10 MG tablet Take 2 tablets (20mg ) for three days (starting 9/27, 9/28, 9/29). Then take one tablet (10mg ) for three days (9/30, 10/1, 10/2). 04/14/18  Yes Dorrell, Andree Elk, MD  predniSONE (DELTASONE) 50 MG tablet 1 p.o. daily x5 07/22/18  Yes Lacretia Leigh, MD  SUBOXONE 8-2 MG FILM Place 1 Film under the tongue daily.  08/21/15  Yes [provider]  umeclidinium-vilanterol (ANORO ELLIPTA) 62.5-25 MCG/INH AEPB Inhale 1 puff into the lungs daily. Patient taking differently: Inhale 1 puff into the lungs daily as needed (sob and wheezing).  04/14/18  Yes Dorrell, Andree Elk, MD  warfarin (COUMADIN) 2.5 MG tablet Take 1 tablet (2.5 mg total) by mouth daily. 1 tablet Monday, Wednesday, Friday and 1/2 tablet the other days. Patient taking differently: Take 1.25-2.5 mg by mouth as directed. Take 1 tablet (  2.5 mg) on Tues & Friday and Take 0.5 tablet (1.25 mg) all other days. 05/20/18  Yes [name], MD  furosemide (LASIX) 20 MG tablet Take 1 tablet (20 mg total) by mouth daily. 03/17/18 06/15/18  Patsey Berthold, NP   Past Medical History:  Diagnosis Date  . Acute lower GI bleeding 02/15/2017  . Atrial fibrillation /flutter    hx of flutter ablation 2/12  . Cardiomyopathy    EF55% 11/14<<35%   . CHF (congestive heart failure) ([place])   . COPD (chronic obstructive pulmonary disease) ([place])   . GERD (gastroesophageal reflux disease)   . Gout   .  Hepatitis    "think I had that once; a long time ago; from dirty needles I think" (12/14/2012)  . Hypertension   . Leukocytosis, unspecified 06/12/2013  . 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 Obesity   . Pneumonia X 1  . Primary polycythemia ([place]) 06/12/2013  . Renal insufficiency   . Substance abuse ([place])    alcohol; hx cocaine, heroin, crack use  . Syncope    Social History   Socioeconomic History  . Marital status: Single    Spouse name: Not on file  . Number of children: Not on file  . Years of education: Not on file  . Highest education level: Not on file  Occupational History  . Occupation: Retired  Scientific laboratory technician  . Financial resource strain: Not on file  . Food insecurity:    Worry: Not on file    Inability: Not on file  . Transportation needs:    Medical: Not on file    Non-medical: Not on file  Tobacco Use  . Smoking status: Current Every Day Smoker    Packs/day: 0.10    Years: 50.00    Pack years: 5.00    Types: Cigarettes  . Smokeless tobacco: Never Used  Substance and Sexual Activity  . Alcohol use: Yes    Alcohol/week: 89.0 standard drinks    Types: 12 Cans of beer, 77 Shots of liquor per week    Comment: 02/15/2017 "a pint of vodka/day" plus 12 pack of beer/week"  . Drug use: Yes    Types: "Crack" cocaine, Cocaine, Heroin    Comment: 02/15/2017 "last used crack 4-5 years ago; last used heroin a couple years ago; currently denies any drug use-- on suboxone.   . Sexual activity: Not Currently  Lifestyle  . Physical activity:    Days per week: Not on file    Minutes per session: Not on file  . Stress: Not on file  Relationships  . Social connections:    Talks on phone: Not on file    Gets together: Not on file    Attends religious service: Not on file    Active member of club or organization: Not on file    Attends meetings of clubs or organizations: Not on file    Relationship status: Not on file  Other Topics Concern  . Not on file   Social History Narrative   Moved here from [place]. Lives with common law wife and grandchildren. He is retired from "general labor."   Family History  Problem Relation Age of Onset  . Diabetes Mother   . Hypertension Mother   . Cirrhosis Father   . Alcohol abuse Father   . Colon cancer Neg Hx   . Rectal cancer Neg Hx   . Stomach cancer Neg Hx     ASSESSMENT Recent Results: The most recent result  is correlated with 12.5 mg per week: Lab Results  Component Value Date   INR 1.4 (A) 08/22/2018   INR 2.1 07/25/2018   INR 1.8 (A) 07/04/2018    Anticoagulation Dosing: Description   Take one (1) tablet of your 2.5mg  green-colored warfarin tablets every day of the week, except take only one-half (1/2) tablet on [facility] AND SUNDAYS. Repeat this regimen weekly.     INR today: Subtherapeutic  PLAN Weekly dose was increased by 20% to 15 mg per week  Patient Instructions  Patient instructed to take medications as defined in the Anti-coagulation Track section of this encounter.  Patient instructed to take today's dose.  Patient verbalized understanding of these instructions.  Patient instructed to take one (1) tablet of your 2.5mg  green-colored warfarin tablets every day of the week, except take only one-half (1/2) tablet on [place]. Repeat this regimen weekly.   Patient advised to contact clinic or seek medical attention if signs/symptoms of bleeding or thromboembolism occur.  Patient verbalized understanding by repeating back information and was advised to contact me if further medication-related questions arise. Patient was also provided an information handout.  Follow-up Return in 1 week (on 08/29/2018) for INR follow-up at 08:45.  15 minutes spent face-to-face with the patient during the encounter. 50% of time spent on education. 50% of time was spent on INR point of care testing, result interpretation, dose adjustment, patient counseling, and documentation  in [email or web address].  Thank you for allowing pharmacy to be a part of this patient's care.  Leron Croak, PharmD PGY1 Pharmacy Resident Phone: [phone]

## 2018-08-23 ENCOUNTER — Other Ambulatory Visit: Payer: Self-pay | Admitting: Pharmacist

## 2018-08-23 DIAGNOSIS — J449 Chronic obstructive pulmonary disease, unspecified: Secondary | ICD-10-CM

## 2018-08-24 MED ORDER — UMECLIDINIUM-VILANTEROL 62.5-25 MCG/INH IN AEPB: 1.0000 | INHALATION_SPRAY | Freq: Every day | RESPIRATORY_TRACT | 11 refills | Status: DC

## 2018-08-26 ENCOUNTER — Other Ambulatory Visit: Payer: Self-pay | Admitting: Internal Medicine

## 2018-08-29 ENCOUNTER — Other Ambulatory Visit: Payer: Self-pay | Admitting: Pharmacist

## 2018-08-29 ENCOUNTER — Ambulatory Visit (INDEPENDENT_AMBULATORY_CARE_PROVIDER_SITE_OTHER): Payer: Medicare Other | Admitting: Pharmacist

## 2018-08-29 DIAGNOSIS — F1020 Alcohol dependence, uncomplicated: Secondary | ICD-10-CM | POA: Diagnosis not present

## 2018-08-29 DIAGNOSIS — I4811 Longstanding persistent atrial fibrillation: Secondary | ICD-10-CM

## 2018-08-29 DIAGNOSIS — Z5181 Encounter for therapeutic drug level monitoring: Secondary | ICD-10-CM | POA: Diagnosis not present

## 2018-08-29 DIAGNOSIS — Z7901 Long term (current) use of anticoagulants: Secondary | ICD-10-CM

## 2018-08-29 LAB — POCT INR: INR: 2.7 (ref 2.0–3.0)

## 2018-09-01 ENCOUNTER — Other Ambulatory Visit: Payer: Self-pay | Admitting: Internal Medicine

## 2018-09-02 MED ORDER — FOLIC ACID 1 MG PO TABS: 1.0000 mg | ORAL_TABLET | Freq: Every day | ORAL | 3 refills | Status: DC

## 2018-09-02 NOTE — Patient Instructions (Signed)
Patient educated about medication as defined in this encounter and verbalized understanding by repeating back instructions provided.   

## 2018-09-02 NOTE — Progress Notes (Signed)
Anticoagulation Management [name] is a 73 y.o. male who reports to the clinic for monitoring of warfarin treatment.    Indication: atrial fibrillation and long-term (current) use of anticoagulation  Duration: indefinite Supervising physician: [name] Clinic Visit History: Patient does not report signs/symptoms of bleeding or thromboembolism at this time. Other recent changes: No diet, medications, lifestyle endorsed by patient.   Anticoagulation Episode Summary    Current INR goal:   2.0-3.0  TTR:   67.3 % (5.8 y)  Next INR check:   09/26/2018  INR from last check:   2.7 (08/29/2018)  Weekly max warfarin dose:     Target end date:   Indefinite  INR check location:   Anticoagulation Clinic  Preferred lab:     Send INR reminders to:      Indications   Atrial fibrillation ([facility]) [I48.91]       Comments:          No Known Allergies Medication Sig  albuterol (PROAIR HFA) 108 (90 Base) MCG/ACT inhaler Inhale 1-2 puffs into the lungs every 6 (six) hours as needed for wheezing or shortness of breath. PLACE ON HOLD UNTIL PATIENT REQUESTS TO FILL Patient taking differently: Inhale 1-2 puffs into the lungs every 6 (six) hours as needed for wheezing or shortness of breath.   amiodarone (PACERONE) 200 MG tablet Take 0.5 tablets (100 mg total) by mouth daily.  carvedilol (COREG) 6.25 MG tablet Take 6.25 mg by mouth daily.   fluticasone (FLOVENT HFA) 44 MCG/ACT inhaler Inhale 2 puffs into the lungs 2 (two) times daily as needed (sob).   folic acid (FOLVITE) 1 MG tablet TAKE 1 TABLET BY MOUTH ONCE DAILY  furosemide (LASIX) 20 MG tablet Take 1 tablet (20 mg total) by mouth daily.  hydroxyurea (HYDREA) 500 MG capsule Take 2 capsules (1,000 mg total) by mouth daily. MAY TAKE WITH FOOD TO MINIMIZE GI SIDE EFFECTS  ipratropium-albuterol (DUONEB) 0.5-2.5 (3) MG/3ML SOLN TAKE 3 MLS BY MOUTH EVERY 4 HOURS AS NEEDED FOR SHORTNESS OF BREATH Patient taking  differently: Inhale 3 mLs into the lungs every 6 (six) hours as needed (sob and wheezing).   SUBOXONE 8-2 MG FILM Place 1 Film under the tongue daily.   umeclidinium-vilanterol (ANORO ELLIPTA) 62.5-25 MCG/INH AEPB Inhale 1 puff into the lungs daily. Use every day (maintenance inhaler)  warfarin (COUMADIN) 2.5 MG tablet Take 1 tablet (2.5 mg total) by mouth daily. 1 tablet Monday, Wednesday, Friday and 1/2 tablet the other days. Patient taking differently: Take 1.25-2.5 mg by mouth as directed. Take 1 tablet (2.5 mg) on Tues & Friday and Take 0.5 tablet (1.25 mg) all other days.   Past Medical History:  Diagnosis Date  . Acute lower GI bleeding 02/15/2017  . Atrial fibrillation /flutter    hx of flutter ablation 2/12  . Cardiomyopathy    EF55% 11/14<<35%   . CHF (congestive heart failure) ([place])   . COPD (chronic obstructive pulmonary disease) ([place])   . GERD (gastroesophageal reflux disease)   . Gout   . Hepatitis    "think I had that once; a long time ago; from dirty needles I think" (12/14/2012)  . Hypertension   . Leukocytosis, unspecified 06/12/2013  . 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 Obesity   . Pneumonia X 1  . Primary polycythemia ([place]) 06/12/2013  . Renal insufficiency   . Substance abuse ([place])    alcohol; hx cocaine, heroin, crack use  . Syncope    Social History  Socioeconomic History  . Marital status: Single    Spouse name: Not on file  . Number of children: Not on file  . Years of education: Not on file  . Highest education level: Not on file  Occupational History  . Occupation: Retired  Scientific laboratory technician  . Financial resource strain: Not on file  . Food insecurity:    Worry: Not on file    Inability: Not on file  . Transportation needs:    Medical: Not on file    Non-medical: Not on file  Tobacco Use  . Smoking status: Current Every Day Smoker    Packs/day: 0.10    Years: 50.00    Pack years: 5.00    Types: Cigarettes  . Smokeless tobacco:  Never Used  Substance and Sexual Activity  . Alcohol use: Yes    Alcohol/week: 89.0 standard drinks    Types: 12 Cans of beer, 77 Shots of liquor per week    Comment: 02/15/2017 "a pint of vodka/day" plus 12 pack of beer/week"  . Drug use: Yes    Types: "Crack" cocaine, Cocaine, Heroin    Comment: 02/15/2017 "last used crack 4-5 years ago; last used heroin a couple years ago; currently denies any drug use-- on suboxone.   . Sexual activity: Not Currently  Lifestyle  . Physical activity:    Days per week: Not on file    Minutes per session: Not on file  . Stress: Not on file  Relationships  . Social connections:    Talks on phone: Not on file    Gets together: Not on file    Attends religious service: Not on file    Active member of club or organization: Not on file    Attends meetings of clubs or organizations: Not on file    Relationship status: Not on file  Other Topics Concern  . Not on file  Social History Narrative   Moved here from [place]. Lives with common law wife and grandchildren. He is retired from "general labor."   Family History  Problem Relation Age of Onset  . Diabetes Mother   . Hypertension Mother   . Cirrhosis Father   . Alcohol abuse Father   . Colon cancer Neg Hx   . Rectal cancer Neg Hx   . Stomach cancer Neg Hx    ASSESSMENT Lab Results  Component Value Date   INR 2.7 08/29/2018   INR 1.4 (A) 08/22/2018   INR 2.1 07/25/2018   Anticoagulation Dosing: Description   Take one (1) tablet of your 2.5mg  green-colored warfarin tablets every day of the week, except take only one-half (1/2) tablet on [name]. Repeat this regimen weekly.     INR today: Therapeutic  PLAN Weekly dose was unchanged   Patient Instructions  Patient educated about medication as defined in this encounter and verbalized understanding by repeating back instructions provided.   Patient advised to contact clinic or seek medical attention if signs/symptoms of  bleeding or thromboembolism occur.  Patient verbalized understanding by repeating back information and was advised to contact me if further medication-related questions arise. Patient was also provided an information handout.  Follow-up Return in about 1 month (around 09/27/2018).  Flossie Dibble  30 minutes spent face-to-face with the patient during the encounter. 50% of time spent on education. 50% of time was spent on assessment, plan, coordination of care. Patient did mention challenges with obtaining inhalers due to cost, provided samples today and referred patient to [facility] Extra  Help program.

## 2018-09-05 NOTE — Progress Notes (Signed)
INTERNAL MEDICINE TEACHING ATTENDING ADDENDUM - Maevyn Riordan M.D  Duration- indefinite, Indication- afib, INR- therapeutic. Agree with pharmacy recommendations as outlined in their note.     

## 2018-09-12 ENCOUNTER — Other Ambulatory Visit: Payer: Self-pay | Admitting: Internal Medicine

## 2018-09-26 ENCOUNTER — Ambulatory Visit (INDEPENDENT_AMBULATORY_CARE_PROVIDER_SITE_OTHER): Payer: Medicare Other

## 2018-09-26 DIAGNOSIS — Z7901 Long term (current) use of anticoagulants: Secondary | ICD-10-CM

## 2018-09-26 DIAGNOSIS — Z5181 Encounter for therapeutic drug level monitoring: Secondary | ICD-10-CM | POA: Diagnosis not present

## 2018-09-26 DIAGNOSIS — I4891 Unspecified atrial fibrillation: Secondary | ICD-10-CM | POA: Diagnosis present

## 2018-09-26 DIAGNOSIS — I4811 Longstanding persistent atrial fibrillation: Secondary | ICD-10-CM

## 2018-09-26 LAB — POCT INR: INR: 2.9 (ref 2.0–3.0)

## 2018-09-26 MED ORDER — WARFARIN SODIUM 2.5 MG PO TABS: ORAL_TABLET | ORAL | 2 refills | Status: DC

## 2018-09-26 NOTE — Patient Instructions (Addendum)
Patient instructed to take medications as defined in the Anti-coagulation Track section of this encounter.  Patient instructed to take today's dose.  Patient instructed to take one (1) tablet of your 2.5mg  green-colored warfarin tablets every day of the week, except take only one-half (1/2) tablet on MONDAYS, [place]. Repeat this regimen weekly. Patient verbalized understanding of these instructions.

## 2018-09-26 NOTE — Progress Notes (Signed)
Anticoagulation Management [name] is a 73 y.o. male who reports to the clinic for monitoring of warfarin treatment.    Indication: atrial fibrillation , long standing Duration: indefinite Supervising physician: [place] Clinic Visit History: Patient does not report signs/symptoms of bleeding or thromboembolism  Other recent changes: Patient endorses no diet, medications, lifestyle changes.  Anticoagulation Episode Summary    Current INR goal:   2.0-3.0  TTR:   67.7 % (5.9 y)  Next INR check:   10/24/2018  INR from last check:   2.9 (09/26/2018)  Weekly max warfarin dose:     Target end date:   Indefinite  INR check location:   Anticoagulation Clinic  Preferred lab:     Send INR reminders to:      Indications   Atrial fibrillation ([place]) [I48.91]       Comments:           No Known Allergies Prior to Admission medications   Medication Sig Start Date End Date Taking? Authorizing Provider  albuterol (PROAIR HFA) 108 (90 Base) MCG/ACT inhaler Inhale 1-2 puffs into the lungs every 6 (six) hours as needed for wheezing or shortness of breath. PLACE ON HOLD UNTIL PATIENT REQUESTS TO FILL Patient taking differently: Inhale 1-2 puffs into the lungs every 6 (six) hours as needed for wheezing or shortness of breath.  09/27/15   Juluis Mire, MD  amiodarone (PACERONE) 200 MG tablet Take 0.5 tablets (100 mg total) by mouth daily. 06/20/18   Chanetta Marshall K, NP  carvedilol (COREG) 6.25 MG tablet Take 6.25 mg by mouth daily.  04/29/18   [provider]  fluticasone (FLOVENT HFA) 44 MCG/ACT inhaler Inhale 2 puffs into the lungs 2 (two) times daily as needed (sob).     [provider]  folic acid (FOLVITE) 1 MG tablet Take 1 tablet (1 mg total) by mouth daily. 09/02/18   [name], MD  furosemide (LASIX) 20 MG tablet Take 1 tablet (20 mg total) by mouth daily. 03/17/18 06/15/18  Patsey Berthold, NP  hydroxyurea (HYDREA) 500 MG capsule  Take 2 capsules (1,000 mg total) by mouth daily. MAY TAKE WITH FOOD TO MINIMIZE GI SIDE EFFECTS 05/20/18   [name], MD  ipratropium-albuterol (DUONEB) 0.5-2.5 (3) MG/3ML SOLN TAKE 3 MLS BY MOUTH EVERY 4 HOURS AS NEEDED FOR SHORTNESS OF BREATH Patient taking differently: Inhale 3 mLs into the lungs every 6 (six) hours as needed (sob and wheezing).  04/15/18   [name], MD  SUBOXONE 8-2 MG FILM Place 1 Film under the tongue daily.  08/21/15   [provider]  umeclidinium-vilanterol (ANORO ELLIPTA) 62.5-25 MCG/INH AEPB Inhale 1 puff into the lungs daily. Use every day (maintenance inhaler) 08/24/18   [name], MD  warfarin (COUMADIN) 2.5 MG tablet Take 1 tablet (2.5 mg total) by mouth daily. 1 tablet Monday, Wednesday, Friday and 1/2 tablet the other days. Patient taking differently: Take 1.25-2.5 mg by mouth as directed. Take 1 tablet (2.5 mg) on Tues & Friday and Take 0.5 tablet (1.25 mg) all other days. 05/20/18   [name], MD   Past Medical History:  Diagnosis Date  . Acute lower GI bleeding 02/15/2017  . Atrial fibrillation /flutter    hx of flutter ablation 2/12  . Cardiomyopathy    EF55% 11/14<<35%   . CHF (congestive heart failure) ([place])   . COPD (chronic obstructive pulmonary disease) ([place])   . GERD (gastroesophageal reflux disease)   .  Gout   . Hepatitis    "think I had that once; a long time ago; from dirty needles I think" (12/14/2012)  . Hypertension   . Leukocytosis, unspecified 06/12/2013  . 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 Obesity   . Pneumonia X 1  . Primary polycythemia ([place]) 06/12/2013  . Renal insufficiency   . Substance abuse ([place])    alcohol; hx cocaine, heroin, crack use  . Syncope    Social History   Socioeconomic History  . Marital status: Single    Spouse name: Not on file  . Number of children: Not on file  . Years of education: Not on file  . Highest education level: Not on file  Occupational History  .  Occupation: Retired  Scientific laboratory technician  . Financial resource strain: Not on file  . Food insecurity:    Worry: Not on file    Inability: Not on file  . Transportation needs:    Medical: Not on file    Non-medical: Not on file  Tobacco Use  . Smoking status: Current Every Day Smoker    Packs/day: 0.10    Years: 50.00    Pack years: 5.00    Types: Cigarettes  . Smokeless tobacco: Never Used  Substance and Sexual Activity  . Alcohol use: Yes    Alcohol/week: 89.0 standard drinks    Types: 12 Cans of beer, 77 Shots of liquor per week    Comment: 02/15/2017 "a pint of vodka/day" plus 12 pack of beer/week"  . Drug use: Yes    Types: "Crack" cocaine, Cocaine, Heroin    Comment: 02/15/2017 "last used crack 4-5 years ago; last used heroin a couple years ago; currently denies any drug use-- on suboxone.   . Sexual activity: Not Currently  Lifestyle  . Physical activity:    Days per week: Not on file    Minutes per session: Not on file  . Stress: Not on file  Relationships  . Social connections:    Talks on phone: Not on file    Gets together: Not on file    Attends religious service: Not on file    Active member of club or organization: Not on file    Attends meetings of clubs or organizations: Not on file    Relationship status: Not on file  Other Topics Concern  . Not on file  Social History Narrative   Moved here from [place]. Lives with common law wife and grandchildren. He is retired from "general labor."   Family History  Problem Relation Age of Onset  . Diabetes Mother   . Hypertension Mother   . Cirrhosis Father   . Alcohol abuse Father   . Colon cancer Neg Hx   . Rectal cancer Neg Hx   . Stomach cancer Neg Hx     ASSESSMENT Recent Results: The most recent result is correlated with 15 mg per week: Lab Results  Component Value Date   INR 2.9 09/26/2018   INR 2.7 08/29/2018   INR 1.4 (A) 08/22/2018    Anticoagulation Dosing: Description   Take one (1) tablet of  your 2.5mg  green-colored warfarin tablets every day of the week, except take only one-half (1/2) tablet on MONDAYS, [place]. Repeat this regimen weekly.     INR today: Therapeutic  PLAN Weekly dose was decreased by 8% to 13.75 mg per week  Patient Instructions  Patient instructed to take medications as defined in the Anti-coagulation Track section of this encounter.  Patient instructed to take today's dose.  Patient instructed to take one (1) tablet of your 2.5mg  green-colored warfarin tablets every day of the week, except take only one-half (1/2) tablet on MONDAYS, [place]. Repeat this regimen weekly. Patient verbalized understanding of these instructions.     Patient advised to contact clinic or seek medical attention if signs/symptoms of bleeding or thromboembolism occur.  Patient verbalized understanding by repeating back information and was advised to contact me if further medication-related questions arise. Patient was also provided an information handout.  Follow-up Return in about 4 weeks (around 10/24/2018) for INR Follow up.  Harrietta Guardian, PharmD PGY1 Pharmacy Resident 09/26/2018    9:55 AM Please check AMION for all [place] numbers  15 minutes spent face-to-face with the patient during the encounter. 50% of time spent on education, including signs/sx bleeding and clotting, as well as food and drug interactions with warfarin. 50% of time was spent on fingerprick POC INR sample collection,processing, results determination, and documentation in [email or web address]

## 2018-09-28 NOTE — Progress Notes (Signed)
INTERNAL MEDICINE TEACHING ATTENDING ADDENDUM  I agree with pharmacy recommendations as outlined in their note.   Alexander N Raines, MD  

## 2018-10-05 ENCOUNTER — Other Ambulatory Visit: Payer: Self-pay | Admitting: Pharmacist

## 2018-10-05 ENCOUNTER — Encounter: Payer: Self-pay | Admitting: Pharmacist

## 2018-10-05 DIAGNOSIS — I4811 Longstanding persistent atrial fibrillation: Secondary | ICD-10-CM

## 2018-10-05 DIAGNOSIS — Z7901 Long term (current) use of anticoagulants: Secondary | ICD-10-CM

## 2018-10-07 ENCOUNTER — Other Ambulatory Visit: Payer: Self-pay | Admitting: *Deleted

## 2018-10-07 DIAGNOSIS — I4811 Longstanding persistent atrial fibrillation: Secondary | ICD-10-CM

## 2018-10-07 DIAGNOSIS — Z7901 Long term (current) use of anticoagulants: Secondary | ICD-10-CM

## 2018-10-24 ENCOUNTER — Other Ambulatory Visit: Payer: Self-pay

## 2018-10-24 ENCOUNTER — Encounter: Payer: Self-pay | Admitting: Pharmacist

## 2018-10-24 ENCOUNTER — Ambulatory Visit (INDEPENDENT_AMBULATORY_CARE_PROVIDER_SITE_OTHER): Payer: Medicare Other | Admitting: Pharmacist

## 2018-10-24 DIAGNOSIS — I4811 Longstanding persistent atrial fibrillation: Secondary | ICD-10-CM

## 2018-10-24 DIAGNOSIS — Z7901 Long term (current) use of anticoagulants: Secondary | ICD-10-CM

## 2018-10-24 DIAGNOSIS — Z5181 Encounter for therapeutic drug level monitoring: Secondary | ICD-10-CM | POA: Diagnosis not present

## 2018-10-24 DIAGNOSIS — I4891 Unspecified atrial fibrillation: Secondary | ICD-10-CM

## 2018-10-24 LAB — POCT INR: INR: 3.8 — AB (ref 2.0–3.0)

## 2018-10-24 NOTE — Progress Notes (Signed)
Reviewed Thanks DrG 

## 2018-10-24 NOTE — Patient Instructions (Signed)
Patient instructed to take medications as defined in the Anti-coagulation Track section of this encounter.  Patient instructed to take today's dose.  Patient instructed to take one (1) tablet of your 2.5mg  green-colored warfarin tablets every day of the week, except take only one-half (1/2) tablet on MONDAYS, [place], [place]. Repeat this regimen weekly. Patient verbalized understanding of these instructions.

## 2018-10-24 NOTE — Progress Notes (Signed)
Anticoagulation Management [name] is a 73 y.o. male who reports to the clinic for monitoring of warfarin treatment.    Indication: atrial fibrillation, long term (current) use of anticoagulation.   Duration: indefinite Supervising physician: Murriel Hopper  Anticoagulation Clinic Visit History: Patient does not report signs/symptoms of bleeding or thromboembolism Other recent changes: No diet, medications, lifestyle changes. Anticoagulation Episode Summary    Current INR goal:   2.0-3.0  TTR:   67.0 % (5.9 y)  Next INR check:   11/21/2018  INR from last check:   3.8! (10/24/2018)  Weekly max warfarin dose:     Target end date:   Indefinite  INR check location:   Anticoagulation Clinic  Preferred lab:     Send INR reminders to:      Indications   Atrial fibrillation ([place]) [I48.91]       Comments:           No Known Allergies Prior to Admission medications   Medication Sig Start Date End Date Taking? Authorizing Provider  albuterol (PROAIR HFA) 108 (90 Base) MCG/ACT inhaler Inhale 1-2 puffs into the lungs every 6 (six) hours as needed for wheezing or shortness of breath. PLACE ON HOLD UNTIL PATIENT REQUESTS TO FILL Patient taking differently: Inhale 1-2 puffs into the lungs every 6 (six) hours as needed for wheezing or shortness of breath.  09/27/15  Yes Juluis Mire, MD  amiodarone (PACERONE) 200 MG tablet Take 0.5 tablets (100 mg total) by mouth daily. 06/20/18  Yes Seiler, Amber K, NP  carvedilol (COREG) 6.25 MG tablet Take 6.25 mg by mouth daily.  04/29/18  Yes [provider]  fluticasone (FLOVENT HFA) 44 MCG/ACT inhaler Inhale 2 puffs into the lungs 2 (two) times daily as needed (sob).    Yes [provider]  folic acid (FOLVITE) 1 MG tablet Take 1 tablet (1 mg total) by mouth daily. 09/02/18  Yes [name], MD  hydroxyurea (HYDREA) 500 MG capsule Take 2 capsules (1,000 mg total) by mouth daily. MAY TAKE WITH FOOD TO MINIMIZE GI SIDE  EFFECTS 05/20/18  Yes [name], MD  ipratropium-albuterol (DUONEB) 0.5-2.5 (3) MG/3ML SOLN TAKE 3 MLS BY MOUTH EVERY 4 HOURS AS NEEDED FOR SHORTNESS OF BREATH Patient taking differently: Inhale 3 mLs into the lungs every 6 (six) hours as needed (sob and wheezing).  04/15/18  Yes [name], MD  SUBOXONE 8-2 MG FILM Place 1 Film under the tongue daily.  08/21/15  Yes [provider]  umeclidinium-vilanterol (ANORO ELLIPTA) 62.5-25 MCG/INH AEPB Inhale 1 puff into the lungs daily. Use every day (maintenance inhaler) 08/24/18  Yes [name], MD  warfarin (COUMADIN) 2.5 MG tablet Take one-half tablet on Monday, Wednesday, Friday and take one tablet all other days. 09/26/18  Yes Oda Kilts, MD  furosemide (LASIX) 20 MG tablet Take 1 tablet (20 mg total) by mouth daily. 03/17/18 06/15/18  Patsey Berthold, NP   Past Medical History:  Diagnosis Date  . Acute lower GI bleeding 02/15/2017  . Atrial fibrillation /flutter    hx of flutter ablation 2/12  . Cardiomyopathy    EF55% 11/14<<35%   . CHF (congestive heart failure) ([place])   . COPD (chronic obstructive pulmonary disease) ([place])   . GERD (gastroesophageal reflux disease)   . Gout   . Hepatitis    "think I had that once; a long time ago; from dirty needles I think" (12/14/2012)  . Hypertension   . Leukocytosis, unspecified  06/12/2013  . Myeloproliferative neoplasm ([place]) 08/15/2013  . Obesity   . Pneumonia X 1  . Primary polycythemia ([place]) 06/12/2013  . Renal insufficiency   . Substance abuse ([place])    alcohol; hx cocaine, heroin, crack use  . Syncope    Social History   Socioeconomic History  . Marital status: Single    Spouse name: Not on file  . Number of children: Not on file  . Years of education: Not on file  . Highest education level: Not on file  Occupational History  . Occupation: Retired  Scientific laboratory technician  . Financial resource strain: Not on file  . Food insecurity:    Worry: Not on file    Inability:  Not on file  . Transportation needs:    Medical: Not on file    Non-medical: Not on file  Tobacco Use  . Smoking status: Current Every Day Smoker    Packs/day: 0.10    Years: 50.00    Pack years: 5.00    Types: Cigarettes  . Smokeless tobacco: Never Used  Substance and Sexual Activity  . Alcohol use: Yes    Alcohol/week: 89.0 standard drinks    Types: 12 Cans of beer, 77 Shots of liquor per week    Comment: 02/15/2017 "a pint of vodka/day" plus 12 pack of beer/week"  . Drug use: Yes    Types: "Crack" cocaine, Cocaine, Heroin    Comment: 02/15/2017 "last used crack 4-5 years ago; last used heroin a couple years ago; currently denies any drug use-- on suboxone.   . Sexual activity: Not Currently  Lifestyle  . Physical activity:    Days per week: Not on file    Minutes per session: Not on file  . Stress: Not on file  Relationships  . Social connections:    Talks on phone: Not on file    Gets together: Not on file    Attends religious service: Not on file    Active member of club or organization: Not on file    Attends meetings of clubs or organizations: Not on file    Relationship status: Not on file  Other Topics Concern  . Not on file  Social History Narrative   Moved here from [place]. Lives with common law wife and grandchildren. He is retired from "general labor."   Family History  Problem Relation Age of Onset  . Diabetes Mother   . Hypertension Mother   . Cirrhosis Father   . Alcohol abuse Father   . Colon cancer Neg Hx   . Rectal cancer Neg Hx   . Stomach cancer Neg Hx     ASSESSMENT Recent Results: The most recent result is correlated with 13.75 mg per week: Lab Results  Component Value Date   INR 3.8 (A) 10/24/2018   INR 2.9 09/26/2018   INR 2.7 08/29/2018    Anticoagulation Dosing: Description   Take one (1) tablet of your 2.5mg  green-colored warfarin tablets every day of the week, except take only one-half (1/2) tablet on MONDAYS, TUESDAYS,  WEDNESDAYS AND FRIDAYS. Repeat this regimen weekly.     INR today: Supratherapeutic  PLAN Weekly dose was decreased by 9% to 12.5 mg per week  Patient Instructions  Patient instructed to take medications as defined in the Anti-coagulation Track section of this encounter.  Patient instructed to take today's dose.  Patient instructed to take one (1) tablet of your 2.5mg  green-colored warfarin tablets every day of the week, except take only one-half (1/2)  tablet on MONDAYS, [place], [place]. Repeat this regimen weekly. Patient verbalized understanding of these instructions.     Patient advised to contact clinic or seek medical attention if signs/symptoms of bleeding or thromboembolism occur.  Patient verbalized understanding by repeating back information and was advised to contact me if further medication-related questions arise. Patient was also provided an information handout.  Follow-up Return in 4 weeks (on 11/21/2018) for Follow up INR at 0915h.  Pennie Banter, PharmD, CPP  15 minutes spent face-to-face with the patient during the encounter. 50% of time spent on education, including signs/sx bleeding and clotting, as well as food and drug interactions with warfarin. 50% of time was spent on fingerprick POC INR sample collection,processing, results determination, and documentation in [email or web address]/.

## 2018-11-21 ENCOUNTER — Encounter: Payer: Self-pay | Admitting: Hematology and Oncology

## 2018-11-21 ENCOUNTER — Other Ambulatory Visit: Payer: Self-pay

## 2018-11-21 ENCOUNTER — Telehealth: Payer: Self-pay | Admitting: *Deleted

## 2018-11-21 ENCOUNTER — Inpatient Hospital Stay: Payer: Medicare Other | Attending: Hematology and Oncology

## 2018-11-21 ENCOUNTER — Inpatient Hospital Stay (HOSPITAL_BASED_OUTPATIENT_CLINIC_OR_DEPARTMENT_OTHER): Payer: Medicare Other | Admitting: Hematology and Oncology

## 2018-11-21 DIAGNOSIS — Z7901 Long term (current) use of anticoagulants: Secondary | ICD-10-CM | POA: Insufficient documentation

## 2018-11-21 DIAGNOSIS — D45 Polycythemia vera: Secondary | ICD-10-CM | POA: Diagnosis present

## 2018-11-21 DIAGNOSIS — Z79899 Other long term (current) drug therapy: Secondary | ICD-10-CM | POA: Insufficient documentation

## 2018-11-21 DIAGNOSIS — Z7951 Long term (current) use of inhaled steroids: Secondary | ICD-10-CM | POA: Insufficient documentation

## 2018-11-21 DIAGNOSIS — T50905A Adverse effect of unspecified drugs, medicaments and biological substances, initial encounter: Secondary | ICD-10-CM | POA: Diagnosis not present

## 2018-11-21 DIAGNOSIS — I482 Chronic atrial fibrillation, unspecified: Secondary | ICD-10-CM | POA: Diagnosis not present

## 2018-11-21 DIAGNOSIS — J449 Chronic obstructive pulmonary disease, unspecified: Secondary | ICD-10-CM | POA: Diagnosis not present

## 2018-11-21 DIAGNOSIS — D6959 Other secondary thrombocytopenia: Secondary | ICD-10-CM | POA: Diagnosis not present

## 2018-11-21 DIAGNOSIS — F17210 Nicotine dependence, cigarettes, uncomplicated: Secondary | ICD-10-CM

## 2018-11-21 DIAGNOSIS — Z720 Tobacco use: Secondary | ICD-10-CM

## 2018-11-21 LAB — CBC WITH DIFFERENTIAL/PLATELET
Abs Immature Granulocytes: 0 10*3/uL (ref 0.00–0.07)
Basophils Absolute: 0 10*3/uL (ref 0.0–0.1)
Basophils Relative: 1 %
Eosinophils Absolute: 0.1 10*3/uL (ref 0.0–0.5)
Eosinophils Relative: 1 %
HCT: 38.7 % — ABNORMAL LOW (ref 39.0–52.0)
Hemoglobin: 13.2 g/dL (ref 13.0–17.0)
Immature Granulocytes: 0 %
Lymphocytes Relative: 20 %
Lymphs Abs: 0.8 10*3/uL (ref 0.7–4.0)
MCH: 43.3 pg — ABNORMAL HIGH (ref 26.0–34.0)
MCHC: 34.1 g/dL (ref 30.0–36.0)
MCV: 126.9 fL — ABNORMAL HIGH (ref 80.0–100.0)
Monocytes Absolute: 0.5 10*3/uL (ref 0.1–1.0)
Monocytes Relative: 12 %
Neutro Abs: 2.7 10*3/uL (ref 1.7–7.7)
Neutrophils Relative %: 66 %
Platelets: 131 10*3/uL — ABNORMAL LOW (ref 150–400)
RBC: 3.05 MIL/uL — ABNORMAL LOW (ref 4.22–5.81)
RDW: 12.9 % (ref 11.5–15.5)
WBC: 4 10*3/uL (ref 4.0–10.5)
nRBC: 0 % (ref 0.0–0.2)

## 2018-11-21 NOTE — Assessment & Plan Note (Signed)
The patient has JAK2 mutation positive myeloproliferative disorder. He tolerated the hydroxyurea well.  He will continue taking hydroxyurea 1000 mg daily I recommend we continue the same  Due to fluctuation of his platelet count, I plan to see him back in 3 months.

## 2018-11-21 NOTE — Telephone Encounter (Signed)
Called and gave the patient the appts for 7/27

## 2018-11-21 NOTE — Progress Notes (Signed)
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 He tolerated the hydroxyurea well.  He will continue taking hydroxyurea 1000 mg daily I recommend we continue the same  Due to fluctuation of his platelet count, I plan to see him back in 3 months.  Thrombocytopenia due to drugs He has intermittent thrombocytopenia, could be due to hydroxyurea He denies recent alcohol intake Observe only for now There is no contraindication to remain on antiplatelet agents or anticoagulants as long as the platelet is greater than 50,000.    Tobacco abuse He has wheezes on exam and continues to smoke We discussed the importance of nicotine cessation He is attempting to quit on his own  COPD (chronic obstructive pulmonary disease) ([place]) He has scattered wheezes on exam He is using his inhaler We discussed importance of nicotine cessation I recommend close follow-up with his pulmonologist for further management   No orders of the defined types were placed in this encounter.   INTERVAL HISTORY: Please see below for problem oriented charting. He returns for further follow-up He denies recent alcohol intake but continues to smoke periodically Denies recent cough, chest pain or shortness of breath No recent infection The patient denies any recent signs or symptoms of bleeding such as spontaneous epistaxis, hematuria or hematochezia.  SUMMARY OF ONCOLOGIC HISTORY:  This is a pleasant gentleman who is being referred here because of high hemoglobin level. In November 2014, we removed one unit of blood and order an additional workup to rule out myeloproliferative disorder. Blood test result confirmed low erythropoietin level along with detectable JAK 2 mutation,  confirmed myeloproliferative disorder Unfortunately, bone marrow biopsy on 06/26/2013 was nondiagnostic On 07/31/13 the patient was started on 1 hydroxyurea a day On 08/15/2013, increased hydroxyurea to 2 tablets a day and remove one more unit of blood due to the high hemoglobin On 09/05/2013, I increased hydroxyurea to 3 tablets a day and remove one more unit of blood due to high hemoglobin and hematocrit On 01/23/2014, dose of hydroxyurea is reduced to 2 tablets a day due to anemia. On 04/23/2015, dose of hydroxyurea is adjusted to 500 mg on Mondays, Wednesdays and Fridays and to take 1000 mg for the rest of the week Subsequently, he was placed on hydroxyurea 1000 mg daily.  He is placed on warfarin therapy for chronic atrial fibrillation.  REVIEW OF SYSTEMS:   Constitutional: Denies fevers, chills or abnormal weight loss Eyes: Denies blurriness of vision Ears, nose, mouth, throat, and face: Denies mucositis or sore throat Respiratory: Denies cough, dyspnea or wheezes Cardiovascular: Denies palpitation, chest discomfort or lower extremity swelling Gastrointestinal:  Denies nausea, heartburn or change in bowel habits Skin: Denies abnormal skin rashes Lymphatics: Denies new lymphadenopathy or easy bruising Neurological:Denies numbness, tingling or new weaknesses Behavioral/Psych: Mood is stable, no new changes  All other systems were reviewed with the patient and are negative.  I have reviewed the past medical history, past surgical history, social history and family history with the patient and they are unchanged from previous note.  ALLERGIES:  has No Known Allergies.  MEDICATIONS:  Current Outpatient Medications  Medication Sig Dispense Refill  . albuterol (PROAIR HFA) 108 (90 Base) MCG/ACT inhaler Inhale 1-2 puffs into the lungs every 6 (six) hours as needed for wheezing or shortness of breath.  PLACE ON HOLD UNTIL PATIENT REQUESTS TO FILL (Patient taking differently: Inhale 1-2 puffs  into the lungs every 6 (six) hours as needed for wheezing or shortness of breath. ) 18 g 3  . amiodarone (PACERONE) 200 MG tablet Take 0.5 tablets (100 mg total) by mouth daily. 45 tablet 3  . carvedilol (COREG) 6.25 MG tablet Take 6.25 mg by mouth daily.   5  . fluticasone (FLOVENT HFA) 44 MCG/ACT inhaler Inhale 2 puffs into the lungs 2 (two) times daily as needed (sob).     . folic acid (FOLVITE) 1 MG tablet Take 1 tablet (1 mg total) by mouth daily. 90 tablet 3  . furosemide (LASIX) 20 MG tablet Take 1 tablet (20 mg total) by mouth daily. 90 tablet 3  . hydroxyurea (HYDREA) 500 MG capsule Take 2 capsules (1,000 mg total) by mouth daily. MAY TAKE WITH FOOD TO MINIMIZE GI SIDE EFFECTS 180 capsule 3  . ipratropium-albuterol (DUONEB) 0.5-2.5 (3) MG/3ML SOLN TAKE 3 MLS BY MOUTH EVERY 4 HOURS AS NEEDED FOR SHORTNESS OF BREATH (Patient taking differently: Inhale 3 mLs into the lungs every 6 (six) hours as needed (sob and wheezing). ) 5400 mL 3  . SUBOXONE 8-2 MG FILM Place 1 Film under the tongue daily.     [name] umeclidinium-vilanterol (ANORO ELLIPTA) 62.5-25 MCG/INH AEPB Inhale 1 puff into the lungs daily. Use every day (maintenance inhaler) 30 each 11  . warfarin (COUMADIN) 2.5 MG tablet Take one-half tablet on Monday, Wednesday, Friday and take one tablet all other days. 24 tablet 2   No current facility-administered medications for this visit.     PHYSICAL EXAMINATION: ECOG PERFORMANCE STATUS: 2 - Symptomatic, <50% confined to bed  Vitals:   11/21/18 0842  BP: 127/63  Pulse: 100  Resp: 18  Temp: (!) 97.5 F (36.4 C)  SpO2: 97%   Filed Weights   11/21/18 0842  Weight: 229 lb 3.2 oz (104 kg)    GENERAL:alert, no distress and comfortable.  He is morbidly obese SKIN: skin color, texture, turgor are normal, no rashes or significant lesions.  Noted some skin bruises EYES: normal, Conjunctiva are pink and non-injected, sclera clear OROPHARYNX:no exudate, no erythema and lips, buccal mucosa,  and tongue normal  NECK: supple, thyroid normal size, non-tender, without nodularity LYMPH:  no palpable lymphadenopathy in the cervical, axillary or inguinal LUNGS: Bilateral wheezes are noted with normal breathing effort HEART: regular rate & rhythm and no murmurs and no lower extremity edema ABDOMEN:abdomen soft, non-tender and normal bowel sounds Musculoskeletal:no cyanosis of digits and no clubbing  NEURO: alert & oriented x 3 with fluent speech, no focal motor/sensory deficits  LABORATORY DATA:  I have reviewed the data as listed    Component Value Date/Time   NA 142 08/15/2018 1810   NA 148 (H) 03/17/2018 0901   NA 143 06/12/2013 1125   K 3.8 08/15/2018 1810   K 3.7 06/12/2013 1125   CL 104 08/15/2018 1810   CO2 29 08/15/2018 1810   CO2 32 (H) 06/12/2013 1125   GLUCOSE 107 (H) 08/15/2018 1810   GLUCOSE 92 06/12/2013 1125   BUN 15 08/15/2018 1810   BUN 15 03/17/2018 0901   BUN 14.6 06/12/2013 1125   CREATININE 1.36 (H) 08/15/2018 1810   CREATININE 1.05 08/30/2014 1628   CREATININE 0.9 06/12/2013 1125   CALCIUM 8.3 (L) 08/15/2018 1810   CALCIUM 8.9 06/12/2013 1125   PROT 6.3 (L) 08/15/2018 1810   PROT 6.5 01/24/2018 0954   PROT  6.9 06/12/2013 1125   ALBUMIN 3.2 (L) 08/15/2018 1810   ALBUMIN 3.3 (L) 01/24/2018 0954   ALBUMIN 3.3 (L) 06/12/2013 1125   AST 20 08/15/2018 1810   AST 21 06/12/2013 1125   ALT 13 08/15/2018 1810   ALT 28 06/12/2013 1125   ALKPHOS 92 08/15/2018 1810   ALKPHOS 112 06/12/2013 1125   BILITOT 0.8 08/15/2018 1810   BILITOT <0.2 01/24/2018 0954   BILITOT 0.82 06/12/2013 1125   GFRNONAA 52 (L) 08/15/2018 1810   GFRNONAA 73 08/30/2014 1628   GFRAA >60 08/15/2018 1810   GFRAA 84 08/30/2014 1628    No results found for: SPEP, UPEP  Lab Results  Component Value Date   WBC 4.0 11/21/2018   NEUTROABS 2.7 11/21/2018   HGB 13.2 11/21/2018   HCT 38.7 (L) 11/21/2018   MCV 126.9 (H) 11/21/2018   PLT 131 (L) 11/21/2018      Chemistry       Component Value Date/Time   NA 142 08/15/2018 1810   NA 148 (H) 03/17/2018 0901   NA 143 06/12/2013 1125   K 3.8 08/15/2018 1810   K 3.7 06/12/2013 1125   CL 104 08/15/2018 1810   CO2 29 08/15/2018 1810   CO2 32 (H) 06/12/2013 1125   BUN 15 08/15/2018 1810   BUN 15 03/17/2018 0901   BUN 14.6 06/12/2013 1125   CREATININE 1.36 (H) 08/15/2018 1810   CREATININE 1.05 08/30/2014 1628   CREATININE 0.9 06/12/2013 1125      Component Value Date/Time   CALCIUM 8.3 (L) 08/15/2018 1810   CALCIUM 8.9 06/12/2013 1125   ALKPHOS 92 08/15/2018 1810   ALKPHOS 112 06/12/2013 1125   AST 20 08/15/2018 1810   AST 21 06/12/2013 1125   ALT 13 08/15/2018 1810   ALT 28 06/12/2013 1125   BILITOT 0.8 08/15/2018 1810   BILITOT <0.2 01/24/2018 0954   BILITOT 0.82 06/12/2013 1125       All questions were answered. The patient knows to call the clinic with any problems, questions or concerns. No barriers to learning was detected.  I spent 15 minutes counseling the patient face to face. The total time spent in the appointment was 20 minutes and more than 50% was on counseling and review of test results  Heath Lark, MD 11/21/2018 9:10 AM

## 2018-11-21 NOTE — Assessment & Plan Note (Signed)
He has scattered wheezes on exam He is using his inhaler We discussed importance of nicotine cessation I recommend close follow-up with his pulmonologist for further management

## 2018-11-21 NOTE — Assessment & Plan Note (Signed)
He has intermittent thrombocytopenia, could be due to hydroxyurea He denies recent alcohol intake Observe only for now There is no contraindication to remain on antiplatelet agents or anticoagulants as long as the platelet is greater than 50,000.

## 2018-11-21 NOTE — Assessment & Plan Note (Signed)
He has wheezes on exam and continues to smoke We discussed the importance of nicotine cessation He is attempting to quit on his own

## 2018-11-24 ENCOUNTER — Ambulatory Visit (INDEPENDENT_AMBULATORY_CARE_PROVIDER_SITE_OTHER): Payer: Medicare Other | Admitting: Pharmacist

## 2018-11-24 DIAGNOSIS — Z5181 Encounter for therapeutic drug level monitoring: Secondary | ICD-10-CM | POA: Diagnosis not present

## 2018-11-24 DIAGNOSIS — I4811 Longstanding persistent atrial fibrillation: Secondary | ICD-10-CM

## 2018-11-24 DIAGNOSIS — I4891 Unspecified atrial fibrillation: Secondary | ICD-10-CM

## 2018-11-24 DIAGNOSIS — Z7901 Long term (current) use of anticoagulants: Secondary | ICD-10-CM | POA: Diagnosis not present

## 2018-11-24 LAB — POCT INR: INR: 2.1 (ref 2.0–3.0)

## 2018-11-24 NOTE — Progress Notes (Signed)
Reviewed Thanks drG 

## 2018-11-24 NOTE — Patient Instructions (Signed)
Patient instructed to take medications as defined in the Anti-coagulation Track section of this encounter.  Patient instructed to take today's dose.  Patient instructed to take  one (1) tablet of your 2.5mg  green-colored warfarin tablets every day of the week, except take only one-half (1/2) tablet on MONDAYS, TUESDAYS, and WEDNESDAYS. Repeat this regimen weekly. Patient verbalized understanding of these instructions.

## 2018-11-24 NOTE — Progress Notes (Signed)
Anticoagulation Management [name] is a 73 y.o. male who reports to the clinic for monitoring of warfarin treatment.    Indication: atrial fibrillation   Duration: indefinite Supervising physician: Murriel Hopper  Anticoagulation Clinic Visit History: Patient does not report signs/symptoms of bleeding or thromboembolism  Other recent changes: No diet, medications, lifestyle changes endorsed.  Anticoagulation Episode Summary    Current INR goal:   2.0-3.0  TTR:   66.8 % (6 y)  Next INR check:   11/21/2018  INR from last check:   2.1 (11/24/2018)  Weekly max warfarin dose:     Target end date:   Indefinite  INR check location:   Anticoagulation Clinic  Preferred lab:     Send INR reminders to:      Indications   Atrial fibrillation ([place]) [I48.91]       Comments:           No Known Allergies Prior to Admission medications   Medication Sig Start Date End Date Taking? Authorizing Provider  albuterol (PROAIR HFA) 108 (90 Base) MCG/ACT inhaler Inhale 1-2 puffs into the lungs every 6 (six) hours as needed for wheezing or shortness of breath. PLACE ON HOLD UNTIL PATIENT REQUESTS TO FILL Patient taking differently: Inhale 1-2 puffs into the lungs every 6 (six) hours as needed for wheezing or shortness of breath.  09/27/15  Yes Juluis Mire, MD  amiodarone (PACERONE) 200 MG tablet Take 0.5 tablets (100 mg total) by mouth daily. 06/20/18  Yes Seiler, Amber K, NP  carvedilol (COREG) 6.25 MG tablet Take 6.25 mg by mouth daily.  04/29/18  Yes [provider]  fluticasone (FLOVENT HFA) 44 MCG/ACT inhaler Inhale 2 puffs into the lungs 2 (two) times daily as needed (sob).    Yes [provider]  folic acid (FOLVITE) 1 MG tablet Take 1 tablet (1 mg total) by mouth daily. 09/02/18  Yes [name], MD  hydroxyurea (HYDREA) 500 MG capsule Take 2 capsules (1,000 mg total) by mouth daily. MAY TAKE WITH FOOD TO MINIMIZE GI SIDE EFFECTS 05/20/18  Yes [name],  MD  ipratropium-albuterol (DUONEB) 0.5-2.5 (3) MG/3ML SOLN TAKE 3 MLS BY MOUTH EVERY 4 HOURS AS NEEDED FOR SHORTNESS OF BREATH Patient taking differently: Inhale 3 mLs into the lungs every 6 (six) hours as needed (sob and wheezing).  04/15/18  Yes [name], MD  SUBOXONE 8-2 MG FILM Place 1 Film under the tongue daily.  08/21/15  Yes [provider]  umeclidinium-vilanterol (ANORO ELLIPTA) 62.5-25 MCG/INH AEPB Inhale 1 puff into the lungs daily. Use every day (maintenance inhaler) 08/24/18  Yes [name], MD  warfarin (COUMADIN) 2.5 MG tablet Take one-half tablet on Monday, Wednesday, Friday and take one tablet all other days. 09/26/18  Yes Oda Kilts, MD  furosemide (LASIX) 20 MG tablet Take 1 tablet (20 mg total) by mouth daily. 03/17/18 06/15/18  Patsey Berthold, NP   Past Medical History:  Diagnosis Date  . Acute lower GI bleeding 02/15/2017  . Atrial fibrillation /flutter    hx of flutter ablation 2/12  . Cardiomyopathy    EF55% 11/14<<35%   . CHF (congestive heart failure) ([place])   . COPD (chronic obstructive pulmonary disease) ([place])   . GERD (gastroesophageal reflux disease)   . Gout   . Hepatitis    "think I had that once; a long time ago; from dirty needles I think" (12/14/2012)  . Hypertension   . Leukocytosis, unspecified 06/12/2013  .  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 Obesity   . Pneumonia X 1  . Primary polycythemia ([place]) 06/12/2013  . Renal insufficiency   . Substance abuse ([place])    alcohol; hx cocaine, heroin, crack use  . Syncope    Social History   Socioeconomic History  . Marital status: Single    Spouse name: Not on file  . Number of children: Not on file  . Years of education: Not on file  . Highest education level: Not on file  Occupational History  . Occupation: Retired  Scientific laboratory technician  . Financial resource strain: Not on file  . Food insecurity:    Worry: Not on file    Inability: Not on file  . Transportation needs:     Medical: Not on file    Non-medical: Not on file  Tobacco Use  . Smoking status: Current Every Day Smoker    Packs/day: 0.10    Years: 50.00    Pack years: 5.00    Types: Cigarettes  . Smokeless tobacco: Never Used  Substance and Sexual Activity  . Alcohol use: Yes    Alcohol/week: 89.0 standard drinks    Types: 12 Cans of beer, 77 Shots of liquor per week    Comment: 02/15/2017 "a pint of vodka/day" plus 12 pack of beer/week"  . Drug use: Yes    Types: "Crack" cocaine, Cocaine, Heroin    Comment: 02/15/2017 "last used crack 4-5 years ago; last used heroin a couple years ago; currently denies any drug use-- on suboxone.   . Sexual activity: Not Currently  Lifestyle  . Physical activity:    Days per week: Not on file    Minutes per session: Not on file  . Stress: Not on file  Relationships  . Social connections:    Talks on phone: Not on file    Gets together: Not on file    Attends religious service: Not on file    Active member of club or organization: Not on file    Attends meetings of clubs or organizations: Not on file    Relationship status: Not on file  Other Topics Concern  . Not on file  Social History Narrative   Moved here from [place]. Lives with common law wife and grandchildren. He is retired from "general labor."   Family History  Problem Relation Age of Onset  . Diabetes Mother   . Hypertension Mother   . Cirrhosis Father   . Alcohol abuse Father   . Colon cancer Neg Hx   . Rectal cancer Neg Hx   . Stomach cancer Neg Hx     ASSESSMENT Recent Results: The most recent result is correlated with 12.5 mg per week: Lab Results  Component Value Date   INR 2.1 11/24/2018   INR 3.8 (A) 10/24/2018   INR 2.9 09/26/2018    Anticoagulation Dosing: Description   Take one (1) tablet of your 2.5mg  green-colored warfarin tablets every day of the week, except take only one-half (1/2) tablet on MONDAYS, TUESDAYS, and WEDNESDAYS. Repeat this regimen weekly.      INR today: Therapeutic  PLAN Weekly dose was increased by 10% to 13.75 mg per week  Patient Instructions  Patient instructed to take medications as defined in the Anti-coagulation Track section of this encounter.  Patient instructed to take today's dose.  Patient instructed to take  one (1) tablet of your 2.5mg  green-colored warfarin tablets every day of the week, except take only one-half (1/2) tablet on MONDAYS,  TUESDAYS, and WEDNESDAYS. Repeat this regimen weekly. Patient verbalized understanding of these instructions.     Patient advised to contact clinic or seek medical attention if signs/symptoms of bleeding or thromboembolism occur.  Patient verbalized understanding by repeating back information and was advised to contact me if further medication-related questions arise. Patient was also provided an information handout.  Follow-up Return in 25 days (on 12/19/2018) for Follow up INR at [place], PharmD, CPP  15 minutes spent face-to-face with the patient during the encounter. 50% of time spent on education, including signs/sx bleeding and clotting, as well as food and drug interactions with warfarin. 50% of time was spent on fingerprick POC INR sample collection,processing, results determination, and documentation in http://www.kim.net/.

## 2018-12-06 ENCOUNTER — Other Ambulatory Visit: Payer: Self-pay | Admitting: *Deleted

## 2018-12-06 DIAGNOSIS — Z7901 Long term (current) use of anticoagulants: Secondary | ICD-10-CM

## 2018-12-06 DIAGNOSIS — I4811 Longstanding persistent atrial fibrillation: Secondary | ICD-10-CM

## 2018-12-17 ENCOUNTER — Emergency Department (HOSPITAL_COMMUNITY)
Admission: EM | Admit: 2018-12-17 | Discharge: 2018-12-17 | Disposition: A | Discharge status: Home / Self Care | Payer: Medicare Other | Attending: Emergency Medicine | Admitting: Emergency Medicine

## 2018-12-17 ENCOUNTER — Other Ambulatory Visit: Payer: Self-pay

## 2018-12-17 ENCOUNTER — Emergency Department (HOSPITAL_COMMUNITY): Payer: Medicare Other

## 2018-12-17 DIAGNOSIS — I509 Heart failure, unspecified: Secondary | ICD-10-CM | POA: Diagnosis not present

## 2018-12-17 DIAGNOSIS — Z79899 Other long term (current) drug therapy: Secondary | ICD-10-CM | POA: Diagnosis not present

## 2018-12-17 DIAGNOSIS — Z7901 Long term (current) use of anticoagulants: Secondary | ICD-10-CM | POA: Insufficient documentation

## 2018-12-17 DIAGNOSIS — I130 Hypertensive heart and chronic kidney disease with heart failure and stage 1 through stage 4 chronic kidney disease, or unspecified chronic kidney disease: Secondary | ICD-10-CM | POA: Insufficient documentation

## 2018-12-17 DIAGNOSIS — F17210 Nicotine dependence, cigarettes, uncomplicated: Secondary | ICD-10-CM | POA: Insufficient documentation

## 2018-12-17 DIAGNOSIS — R0602 Shortness of breath: Secondary | ICD-10-CM | POA: Diagnosis present

## 2018-12-17 DIAGNOSIS — J449 Chronic obstructive pulmonary disease, unspecified: Secondary | ICD-10-CM | POA: Diagnosis not present

## 2018-12-17 DIAGNOSIS — Z20828 Contact with and (suspected) exposure to other viral communicable diseases: Secondary | ICD-10-CM | POA: Diagnosis not present

## 2018-12-17 DIAGNOSIS — N189 Chronic kidney disease, unspecified: Secondary | ICD-10-CM | POA: Diagnosis not present

## 2018-12-17 LAB — COMPREHENSIVE METABOLIC PANEL
ALT: 18 U/L (ref 0–44)
AST: 19 U/L (ref 15–41)
Albumin: 3.2 g/dL — ABNORMAL LOW (ref 3.5–5.0)
Alkaline Phosphatase: 88 U/L (ref 38–126)
Anion gap: 14 (ref 5–15)
BUN: 18 mg/dL (ref 8–23)
CO2: 24 mmol/L (ref 22–32)
Calcium: 8.7 mg/dL — ABNORMAL LOW (ref 8.9–10.3)
Chloride: 105 mmol/L (ref 98–111)
Creatinine, Ser: 1.31 mg/dL — ABNORMAL HIGH (ref 0.61–1.24)
GFR calc Af Amer: >60 mL/min (ref >60)
GFR calc non Af Amer: 54 mL/min — ABNORMAL LOW (ref >60)
Glucose, Bld: 150 mg/dL — ABNORMAL HIGH (ref 70–99)
Potassium: 3.7 mmol/L (ref 3.5–5.1)
Sodium: 143 mmol/L (ref 135–145)
Total Bilirubin: 1.2 mg/dL (ref 0.3–1.2)
Total Protein: 6.8 g/dL (ref 6.5–8.1)

## 2018-12-17 LAB — CBC WITH DIFFERENTIAL/PLATELET
Abs Immature Granulocytes: 0.01 10*3/uL (ref 0.00–0.07)
Basophils Absolute: 0 10*3/uL (ref 0.0–0.1)
Basophils Relative: 0 %
Eosinophils Absolute: 0.1 10*3/uL (ref 0.0–0.5)
Eosinophils Relative: 1 %
HCT: 40.4 % (ref 39.0–52.0)
Hemoglobin: 13.6 g/dL (ref 13.0–17.0)
Immature Granulocytes: 0 %
Lymphocytes Relative: 13 %
Lymphs Abs: 0.7 10*3/uL (ref 0.7–4.0)
MCH: 42 pg — ABNORMAL HIGH (ref 26.0–34.0)
MCHC: 33.7 g/dL (ref 30.0–36.0)
MCV: 124.7 fL — ABNORMAL HIGH (ref 80.0–100.0)
Monocytes Absolute: 0.5 10*3/uL (ref 0.1–1.0)
Monocytes Relative: 9 %
Neutro Abs: 4.1 10*3/uL (ref 1.7–7.7)
Neutrophils Relative %: 77 %
Platelets: 156 10*3/uL (ref 150–400)
RBC: 3.24 MIL/uL — ABNORMAL LOW (ref 4.22–5.81)
RDW: 12 % (ref 11.5–15.5)
WBC: 5.3 10*3/uL (ref 4.0–10.5)
nRBC: 0 % (ref 0.0–0.2)

## 2018-12-17 LAB — TROPONIN I: Troponin I: <0.03 ng/mL (ref <0.03)

## 2018-12-17 LAB — LACTIC ACID, PLASMA: Lactic Acid, Venous: 1.5 mmol/L (ref 0.5–1.9)

## 2018-12-17 LAB — PHOSPHORUS: Phosphorus: 2.8 mg/dL (ref 2.5–4.6)

## 2018-12-17 LAB — PROTIME-INR
INR: 2 — ABNORMAL HIGH (ref 0.8–1.2)
Prothrombin Time: 22.1 s — ABNORMAL HIGH (ref 11.4–15.2)

## 2018-12-17 LAB — SARS CORONAVIRUS 2 BY RT PCR (HOSPITAL ORDER, PERFORMED IN ~~LOC~~ HOSPITAL LAB): SARS Coronavirus 2: NEGATIVE

## 2018-12-17 LAB — BRAIN NATRIURETIC PEPTIDE: B Natriuretic Peptide: 694.5 pg/mL — ABNORMAL HIGH (ref 0.0–100.0)

## 2018-12-17 LAB — MAGNESIUM: Magnesium: 1.7 mg/dL (ref 1.7–2.4)

## 2018-12-17 MED ORDER — PREDNISONE 50 MG PO TABS: 50.0000 mg | ORAL_TABLET | Freq: Every day | ORAL | 0 refills | Status: DC

## 2018-12-17 MED ORDER — LORAZEPAM 1 MG PO TABS: 0.0000 mg | ORAL_TABLET | Freq: Four times a day (QID) | ORAL | Status: DC

## 2018-12-17 MED ORDER — ALBUTEROL SULFATE HFA 108 (90 BASE) MCG/ACT IN AERS
8.0000 | INHALATION_SPRAY | Freq: Once | RESPIRATORY_TRACT | Status: AC
  Administered 2018-12-17: 11:00:00 8 via RESPIRATORY_TRACT
  Filled 2018-12-17: qty 6.7

## 2018-12-17 MED ORDER — LORAZEPAM 2 MG/ML IJ SOLN: 0.0000 mg | Freq: Four times a day (QID) | INTRAMUSCULAR | Status: DC

## 2018-12-17 MED ORDER — METHYLPREDNISOLONE SODIUM SUCC 125 MG IJ SOLR
80.0000 mg | Freq: Once | INTRAMUSCULAR | Status: AC
  Administered 2018-12-17: 80 mg via INTRAVENOUS
  Filled 2018-12-17: qty 2

## 2018-12-17 MED ORDER — LORAZEPAM 2 MG/ML IJ SOLN: 0.0000 mg | Freq: Two times a day (BID) | INTRAMUSCULAR | Status: DC

## 2018-12-17 MED ORDER — LORAZEPAM 1 MG PO TABS: 0.0000 mg | ORAL_TABLET | Freq: Two times a day (BID) | ORAL | Status: DC

## 2018-12-17 MED ORDER — LORAZEPAM 2 MG/ML IJ SOLN
1.0000 mg | Freq: Once | INTRAMUSCULAR | Status: AC
  Administered 2018-12-17: 09:00:00 1 mg via INTRAVENOUS
  Filled 2018-12-17: qty 1

## 2018-12-17 MED ORDER — VITAMIN B-1 100 MG PO TABS
100.0000 mg | ORAL_TABLET | Freq: Once | ORAL | Status: AC
  Administered 2018-12-17: 100 mg via ORAL
  Filled 2018-12-17: qty 1

## 2018-12-17 MED ORDER — METOPROLOL TARTRATE 25 MG PO TABS
25.0000 mg | ORAL_TABLET | Freq: Once | ORAL | Status: AC
  Administered 2018-12-17: 11:00:00 25 mg via ORAL
  Filled 2018-12-17: qty 1

## 2018-12-17 MED ORDER — THIAMINE HCL 100 MG/ML IJ SOLN: 100.0000 mg | Freq: Every day | INTRAMUSCULAR | Status: DC

## 2018-12-17 NOTE — ED Notes (Signed)
Lunch offered while waiting for disposition.  Pt taking fluids well.

## 2018-12-17 NOTE — ED Triage Notes (Signed)
1 week hx of S"OB and edema x 4 extremities worsening this AM.  Pain to bilateral legs.   Denies any CP, N/V/D or fever.  HX of CHF, took his Lasix this AM.

## 2018-12-17 NOTE — Discharge Instructions (Addendum)
Take prednisone as prescribed for the next 4 days.  Take your Lasix twice daily for the next 3 days.  Use 2 puffs of the inhaler given to you or your DuoNeb at home every 6 hours as needed for shortness of breath or wheezing.  Please follow-up at the atrial fibrillation clinic this week for recheck and further management.  Please return to the emergency department if you develop any new or worsening symptoms.

## 2018-12-17 NOTE — ED Notes (Signed)
Pt calm and cooperative throughout IV start and procedures.  CIWA completed and pt denies any s/sx.  States he does not drink daily and last intake was 2 days ago per his report.

## 2018-12-17 NOTE — ED Notes (Signed)
Coronavirus negative precautions removed.

## 2018-12-17 NOTE — ED Notes (Signed)
Pt up at bedside, intermittently dozes and repositions himself.  Denies any current needs.

## 2018-12-17 NOTE — ED Provider Notes (Signed)
[facility] EMERGENCY DEPARTMENT Provider Note   CSN: [id number] Arrival date & time: 12/17/18  0865    History   Chief Complaint No chief complaint on file.   HPI [name] is a 73 y.o. male with history of hypertension, GERD, COPD, CHF, atrial fibrillation anticoagulated on Coumadin who presents with shortness of breath the past few days.  Patient is also noted some swelling in his lower extremities.  He denies any chest pain, cough, fever.  Patient denies any abdominal pain, nausea, vomiting.  He drinks a pint of liquor daily.  He last drank yesterday.     HPI  Past Medical History:  Diagnosis Date  . Acute lower GI bleeding 02/15/2017  . Atrial fibrillation /flutter    hx of flutter ablation 2/12  . Cardiomyopathy    EF55% 11/14<<35%   . CHF (congestive heart failure) ([place])   . COPD (chronic obstructive pulmonary disease) ([place])   . GERD (gastroesophageal reflux disease)   . Gout   . Hepatitis    "think I had that once; a long time ago; from dirty needles I think" (12/14/2012)  . Hypertension   . Leukocytosis, unspecified 06/12/2013  . 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 Obesity   . Pneumonia X 1  . Primary polycythemia ([place]) 06/12/2013  . Renal insufficiency   . Substance abuse ([place])    alcohol; hx cocaine, heroin, crack use  . Syncope     Patient Active Problem List   Diagnosis Date Noted  . COPD exacerbation ([place]) 04/12/2018  . Elevated troponin 04/12/2018  . Long term current use of amiodarone 01/24/2018  . GI bleed 02/15/2017  . Pancytopenia, acquired ([place]) 11/26/2016  . Tubular adenoma 11/25/2016  . Gout 11/02/2016  . Preventive measure 05/25/2016  . Hepatitis C antibody test positive   . Hematuria 11/09/2014  . Acute kidney injury ([place]) 11/01/2014  . Leukopenia due to antineoplastic chemotherapy ([place]) 08/24/2014  . Thrombocytopenia due to drugs 07/25/2014  . Encounter for screening colonoscopy 07/02/2014   . Chronic anticoagulation 07/02/2014  . Prolonged QT interval 06/05/2014  . Sinus bradycardia 06/05/2014  . Opioid dependence ([place]) 06/05/2014  . Diastolic dysfunction 78/46/9629  . COPD (chronic obstructive pulmonary disease) ([place]) 05/30/2014  . Health care maintenance 05/24/2014  . Anemia due to antineoplastic chemotherapy 04/24/2014  . Thyroid mass 08/26/2013  . Syncope 08/22/2013  . 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 Polycythemia vera ([place]) 01/03/2013  . Malnutrition of moderate degree ([place]) 12/15/2012  . Tobacco abuse 09/15/2012  . Heart failure with preserved left ventricular function (HFpEF) ([place])   . Alcoholism ([place]) 09/30/2010  . Hypertension 09/30/2010  . Atrial fibrillation [facility])     Past Surgical History:  Procedure Laterality Date  . CARDIAC ELECTROPHYSIOLOGY MAPPING AND ABLATION  08/2010   [name] 09/07/2010 (12/14/2012)  . COLONOSCOPY     15-20 years ago had colon in [place]  . EXCISIONAL HEMORRHOIDECTOMY  1970's  . LOOP RECORDER IMPLANT N/A 08/23/2013   Procedure: LOOP RECORDER IMPLANT;  Surgeon: Deboraha Sprang, MD;  Location: [facility] CATH LAB;  Service: Cardiovascular;  Laterality: N/A;  . MULTIPLE EXTRACTIONS WITH ALVEOLOPLASTY Bilateral 01/24/2016   Procedure: MULTIPLE EXTRACTION WITH ALVEOLOPLASTY BILATERAL;  Surgeon: Diona Browner, DDS;  Location: [facility];  Service: Oral Surgery;  Laterality: Bilateral;  . MULTIPLE TOOTH EXTRACTIONS  01/24/2016   MULTIPLE EXTRACTION WITH ALVEOLOPLASTY BILATERAL (Bilateral)        Home Medications    Prior to Admission medications   Medication Sig  Start Date End Date Taking? Authorizing Provider  albuterol (PROAIR HFA) 108 (90 Base) MCG/ACT inhaler Inhale 1-2 puffs into the lungs every 6 (six) hours as needed for wheezing or shortness of breath. PLACE ON HOLD UNTIL PATIENT REQUESTS TO FILL Patient taking differently: Inhale 1-2 puffs into the lungs every 6 (six) hours as needed for wheezing or shortness of breath.  09/27/15  Yes  Juluis Mire, MD  amiodarone (PACERONE) 200 MG tablet Take 0.5 tablets (100 mg total) by mouth daily. 06/20/18  Yes Seiler, Amber K, NP  carvedilol (COREG) 12.5 MG tablet Take 12.5 mg by mouth daily. 08/02/18  Yes [provider]  fluticasone (FLOVENT HFA) 44 MCG/ACT inhaler Inhale 2 puffs into the lungs 2 (two) times daily as needed (sob).    Yes [provider]  folic acid (FOLVITE) 1 MG tablet Take 1 tablet (1 mg total) by mouth daily. 09/02/18  Yes [name], MD  hydroxyurea (HYDREA) 500 MG capsule Take 2 capsules (1,000 mg total) by mouth daily. MAY TAKE WITH FOOD TO MINIMIZE GI SIDE EFFECTS 05/20/18  Yes [name], MD  ipratropium-albuterol (DUONEB) 0.5-2.5 (3) MG/3ML SOLN TAKE 3 MLS BY MOUTH EVERY 4 HOURS AS NEEDED FOR SHORTNESS OF BREATH Patient taking differently: Inhale 3 mLs into the lungs every 6 (six) hours as needed (sob and wheezing).  04/15/18  Yes [name], MD  SUBOXONE 8-2 MG FILM Place 1 Film under the tongue daily.  08/21/15  Yes [provider]  umeclidinium-vilanterol (ANORO ELLIPTA) 62.5-25 MCG/INH AEPB Inhale 1 puff into the lungs daily. Use every day (maintenance inhaler) 08/24/18  Yes [name], MD  warfarin (COUMADIN) 2.5 MG tablet Take one-half tablet on Monday, Wednesday, Friday and take one tablet all other days. Patient taking differently: Take 1.25-2.5 mg by mouth See admin instructions. Take one-half tablet on Monday, Tues, Wed, Thursday and take one tablet all other days. 09/26/18  Yes Oda Kilts, MD  furosemide (LASIX) 20 MG tablet Take 1 tablet (20 mg total) by mouth daily. 03/17/18 06/15/18  Patsey Berthold, NP  predniSONE (DELTASONE) 50 MG tablet Take 1 tablet (50 mg total) by mouth daily with breakfast. 12/17/18   Bevin Das, Bea Graff, PA-C    Family History Family History  Problem Relation Age of Onset  . Diabetes Mother   . Hypertension Mother   . Cirrhosis Father   . Alcohol abuse Father   . Colon cancer Neg  Hx   . Rectal cancer Neg Hx   . Stomach cancer Neg Hx     Social History Social History   Tobacco Use  . Smoking status: Current Every Day Smoker    Packs/day: 0.10    Years: 50.00    Pack years: 5.00    Types: Cigarettes  . Smokeless tobacco: Never Used  Substance Use Topics  . Alcohol use: Yes    Alcohol/week: 89.0 standard drinks    Types: 12 Cans of beer, 77 Shots of liquor per week    Comment: 02/15/2017 "a pint of vodka/day" plus 12 pack of beer/week"  . Drug use: Yes    Types: "Crack" cocaine, Cocaine, Heroin    Comment: 02/15/2017 "last used crack 4-5 years ago; last used heroin a couple years ago; currently denies any drug use-- on suboxone.      Allergies   Patient has no known allergies.   Review of Systems Review of Systems  Constitutional: Negative for chills and fever.  HENT: Negative for facial  swelling and sore throat.   Respiratory: Positive for shortness of breath. Negative for cough.   Cardiovascular: Positive for leg swelling. Negative for chest pain.  Gastrointestinal: Negative for abdominal pain, nausea and vomiting.  Genitourinary: Negative for dysuria.  Musculoskeletal: Negative for back pain.  Skin: Negative for rash and wound.  Neurological: Negative for headaches.  Psychiatric/Behavioral: The patient is not nervous/anxious.      Physical Exam Updated Vital Signs BP 123/80 (BP Location: Right Arm)   Pulse 96   Temp 97.8 F (36.6 C) (Oral)   Resp 18   Ht 5\' 6"  (1.676 m)   Wt 97.5 kg   SpO2 97%   BMI 34.70 kg/m   Physical Exam Vitals signs and nursing note reviewed.  Constitutional:      General: He is not in acute distress.    Appearance: He is well-developed. He is not diaphoretic.  HENT:     Head: Normocephalic and atraumatic.     Mouth/Throat:     Pharynx: No oropharyngeal exudate.  Eyes:     General: No scleral icterus.       Right eye: No discharge.        Left eye: No discharge.     Conjunctiva/sclera: Conjunctivae  normal.     Pupils: Pupils are equal, round, and reactive to light.  Neck:     Musculoskeletal: Normal range of motion and neck supple.     Thyroid: No thyromegaly.  Cardiovascular:     Rate and Rhythm: Regular rhythm. Tachycardia present.     Heart sounds: Normal heart sounds. No murmur. No friction rub. No gallop.   Pulmonary:     Effort: Pulmonary effort is normal. No respiratory distress.     Breath sounds: No stridor. Wheezing (expiratory wheezing bilaterally) present. No rales.  Abdominal:     General: Bowel sounds are normal. There is no distension.     Palpations: Abdomen is soft.     Tenderness: There is no abdominal tenderness. There is no guarding or rebound.  Musculoskeletal:     Right lower leg: Edema (2+) present.     Left lower leg: Edema (2+) present.  Lymphadenopathy:     Cervical: No cervical adenopathy.  Skin:    General: Skin is warm and dry.     Coloration: Skin is not pale.     Findings: No rash.  Neurological:     Mental Status: He is alert.     Coordination: Coordination normal.      ED Treatments / Results  Labs (all labs ordered are listed, but only abnormal results are displayed) Labs Reviewed  COMPREHENSIVE METABOLIC PANEL - Abnormal; Notable for the following components:      Result Value   Glucose, Bld 150 (*)    Creatinine, Ser 1.31 (*)    Calcium 8.7 (*)    Albumin 3.2 (*)    GFR calc non Af Amer 54 (*)    All other components within normal limits  CBC WITH DIFFERENTIAL/PLATELET - Abnormal; Notable for the following components:   RBC 3.24 (*)    MCV 124.7 (*)    MCH 42.0 (*)    All other components within normal limits  PROTIME-INR - Abnormal; Notable for the following components:   Prothrombin Time 22.1 (*)    INR 2.0 (*)    All other components within normal limits  BRAIN NATRIURETIC PEPTIDE - Abnormal; Notable for the following components:   B Natriuretic Peptide 694.5 (*)    All other  components within normal limits  SARS  CORONAVIRUS 2 (HOSPITAL ORDER, [facility])  TROPONIN I  LACTIC ACID, PLASMA  MAGNESIUM  PHOSPHORUS    EKG EKG Interpretation  Date/Time:  Saturday Dec 17 2018 07:30:51 EDT Ventricular Rate:  137 PR Interval:    QRS Duration: 92 QT Interval:  340 QTC Calculation: 514 R Axis:   -4 Text Interpretation:  Atrial fibrillation Prolonged QT interval No acute changes No significant change since last tracing Confirmed by Varney Biles [phone]) on 12/17/2018 8:00:08 AM   Radiology Dg Chest Portable 1 View  Result Date: 12/17/2018 CLINICAL DATA:  Shortness of breath x1 week EXAM: PORTABLE CHEST 1 VIEW COMPARISON:  08/15/2018 FINDINGS: Lungs are clear.  No pleural effusion or pneumothorax. Cardiomegaly.  Thoracic aortic atherosclerosis IMPRESSION: No evidence of acute cardiopulmonary disease. Electronically Signed   By: Julian Hy M.D.   On: 12/17/2018 08:28    Procedures Procedures (including critical care time)  Medications Ordered in ED Medications  thiamine (VITAMIN B-1) tablet 100 mg (100 mg Oral Given 12/17/18 0812)  LORazepam (ATIVAN) injection 1 mg (1 mg Intravenous Given 12/17/18 0833)  albuterol (VENTOLIN HFA) 108 (90 Base) MCG/ACT inhaler 8 puff (8 puffs Inhalation Given 12/17/18 1127)  metoprolol tartrate (LOPRESSOR) tablet 25 mg (25 mg Oral Given 12/17/18 1127)  methylPREDNISolone sodium succinate (SOLU-MEDROL) 125 mg/2 mL injection 80 mg (80 mg Intravenous Given 12/17/18 1127)     Initial Impression / Assessment and Plan / ED Course  I have reviewed the triage vital signs and the nursing notes.  Pertinent labs & imaging results that were available during my care of the patient were reviewed by me and considered in my medical decision making (see chart for details).        Patient presenting with shortness of breath.  Symptoms much improved after Solu-Medrol and albuterol.  Labs are stable for the patient, except BNP mildly elevated at  694.5.  COVID-19 negative.  Chest x-ray is clear.  Suspect an element of alcohol withdrawal.  Tachycardia resolved with Ativan.  Low suspicion of pulmonary embolism as patient is anticoagulated on Coumadin and is therapeutic.  Patient ambulated with pulse ox without difficulty following Solu-Medrol and albuterol and will discharge home with prednisone burst.  Patient also advised to double Lasix for the next 3 days considering elevated BNP and pitting edema.  Follow-up to A. fib clinic as planned.  Return precautions discussed.  Patient understands and agrees with plan.  Patient vital stable throughout ED course and discharged in satisfactory condition.  Patient also evaluated by my attending, Dr. Kathrynn Humble, who guided the patient's management and agrees with plan.  Final Clinical Impressions(s) / ED Diagnoses   Final diagnoses:  Shortness of breath    ED Discharge Orders         Ordered    predniSONE (DELTASONE) 50 MG tablet  Daily with breakfast     12/17/18 [address] PA-C 12/18/18 [place]    Varney Biles, MD 12/18/18 [phone]

## 2018-12-17 NOTE — ED Notes (Signed)
Pt ambulated without c/o difficulty breathing.  Outwardly he did display some dyspnea, but he states he feels normal.   SATS remained above 93% throughout the walk.

## 2018-12-19 ENCOUNTER — Other Ambulatory Visit: Payer: Self-pay | Admitting: *Deleted

## 2018-12-19 DIAGNOSIS — I4811 Longstanding persistent atrial fibrillation: Secondary | ICD-10-CM

## 2018-12-19 DIAGNOSIS — Z7901 Long term (current) use of anticoagulants: Secondary | ICD-10-CM

## 2018-12-19 LAB — PROTIME-INR
INR: 2.1 — ABNORMAL HIGH (ref 0.8–1.2)
Prothrombin Time: 21.3 s — ABNORMAL HIGH (ref 9.1–12.0)

## 2018-12-20 ENCOUNTER — Telehealth: Payer: Self-pay | Admitting: Pharmacist

## 2018-12-20 NOTE — Telephone Encounter (Signed)
Left message on VM:  Take 1/2 of your green, 2.5mg  stregnth warfarin tablets on M/T/W; all other days, take ONE (1) whole tablet. Repeat INR at [facility] on 16-Jan-2019. INR yesterday = 2.1 (same as last INR).

## 2019-01-11 ENCOUNTER — Other Ambulatory Visit: Payer: Self-pay | Admitting: Internal Medicine

## 2019-01-11 ENCOUNTER — Encounter: Payer: Self-pay | Admitting: Internal Medicine

## 2019-01-11 ENCOUNTER — Ambulatory Visit (INDEPENDENT_AMBULATORY_CARE_PROVIDER_SITE_OTHER): Payer: Medicare Other | Admitting: Internal Medicine

## 2019-01-11 ENCOUNTER — Other Ambulatory Visit: Payer: Self-pay

## 2019-01-11 DIAGNOSIS — Z7951 Long term (current) use of inhaled steroids: Secondary | ICD-10-CM | POA: Diagnosis not present

## 2019-01-11 DIAGNOSIS — I5030 Unspecified diastolic (congestive) heart failure: Secondary | ICD-10-CM

## 2019-01-11 DIAGNOSIS — J449 Chronic obstructive pulmonary disease, unspecified: Secondary | ICD-10-CM

## 2019-01-11 DIAGNOSIS — I110 Hypertensive heart disease with heart failure: Secondary | ICD-10-CM | POA: Diagnosis not present

## 2019-01-11 DIAGNOSIS — J441 Chronic obstructive pulmonary disease with (acute) exacerbation: Secondary | ICD-10-CM

## 2019-01-11 DIAGNOSIS — Z720 Tobacco use: Secondary | ICD-10-CM | POA: Diagnosis not present

## 2019-01-11 DIAGNOSIS — R21 Rash and other nonspecific skin eruption: Secondary | ICD-10-CM

## 2019-01-11 DIAGNOSIS — I480 Paroxysmal atrial fibrillation: Secondary | ICD-10-CM | POA: Diagnosis not present

## 2019-01-11 DIAGNOSIS — I10 Essential (primary) hypertension: Secondary | ICD-10-CM

## 2019-01-11 DIAGNOSIS — Z79899 Other long term (current) drug therapy: Secondary | ICD-10-CM

## 2019-01-11 DIAGNOSIS — Z7901 Long term (current) use of anticoagulants: Secondary | ICD-10-CM | POA: Diagnosis not present

## 2019-01-11 DIAGNOSIS — I4811 Longstanding persistent atrial fibrillation: Secondary | ICD-10-CM

## 2019-01-11 MED ORDER — CARVEDILOL 3.125 MG PO TABS: 12.5000 mg | ORAL_TABLET | Freq: Every day | ORAL | 3 refills | Status: DC

## 2019-01-11 MED ORDER — TRELEGY ELLIPTA 100-62.5-25 MCG/INH IN AEPB: 1.0000 | INHALATION_SPRAY | Freq: Every day | RESPIRATORY_TRACT | 5 refills | Status: DC

## 2019-01-11 MED ORDER — ALBUTEROL SULFATE HFA 108 (90 BASE) MCG/ACT IN AERS: 1.0000 | INHALATION_SPRAY | Freq: Four times a day (QID) | RESPIRATORY_TRACT | 3 refills | Status: DC | PRN

## 2019-01-11 MED ORDER — ALBUTEROL SULFATE HFA 108 (90 BASE) MCG/ACT IN AERS: 1.0000 | INHALATION_SPRAY | Freq: Four times a day (QID) | RESPIRATORY_TRACT | 3 refills | Status: AC | PRN

## 2019-01-11 NOTE — Assessment & Plan Note (Signed)
Euvolemic on exam. Endorses chronic sob but more related to his COPD. He takes lasix 20mg  qd. He saw cardiology and amiodarone was decreased from 200mg  to 100mg  qd. TSH one year ago WNLs.   - continue lasix 20mg  qd, amiodarone 100mg  qd

## 2019-01-11 NOTE — Assessment & Plan Note (Signed)
Chronic. Currently rhythm is irregular. Denies palpitations, chest pain, sob. HR 92. States he ran out of his coreg and the pharmacy hasn't been able to refill it.   - Rx coreg 3.125mg  qd - continue amiodarone 100mg  qd

## 2019-01-11 NOTE — Assessment & Plan Note (Signed)
Chronic, uncontrolled. PFTs in 2015 show severe airway obstruction. He continues smoking 2-3 cigarrettes/day. He uses anoro inhaler once daily. Was previously prescribed inhaled fluticasone and albuterol but states he no longer has these medications. He has had several ED visits for copd exacerbations and endorses sob at rest and while sleeping. His lungs are CTAB.   - encourage smoking cessation - d/c anoro - Rx Trellegy ellipta 1 puff qd - Rx albuterol inhaler prn  - f/u in 3 months

## 2019-01-11 NOTE — Assessment & Plan Note (Signed)
Well controlled on lasix 20mg  qd. Ran out of coreg. Will resume at lowest dose of 3.125mg  qd

## 2019-01-11 NOTE — Progress Notes (Signed)
   CC: leg rash  HPI:  [name] is a 73 y.o. M with severe COPD, dCHF, PAF on warfarin, HTN presenting for f/u of chronic conditions as well as evaluation of new leg rash.  See encounter tab for full details of HPI.   Past Medical History:  Diagnosis Date  . Acute lower GI bleeding 02/15/2017  . Atrial fibrillation /flutter    hx of flutter ablation 2/12  . Cardiomyopathy    EF55% 11/14<<35%   . CHF (congestive heart failure) ([place])   . COPD (chronic obstructive pulmonary disease) ([place])   . GERD (gastroesophageal reflux disease)   . Gout   . Hepatitis    "think I had that once; a long time ago; from dirty needles I think" (12/14/2012)  . Hypertension   . Leukocytosis, unspecified 06/12/2013  . 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 Obesity   . Pneumonia X 1  . Primary polycythemia ([place]) 06/12/2013  . Renal insufficiency   . Substance abuse ([place])    alcohol; hx cocaine, heroin, crack use  . Syncope     Physical Exam:  Vitals:   01/11/19 1408  BP: 116/71  Pulse: 92  Temp: 98.4 F (36.9 C)  TempSrc: Oral  SpO2: 98%  Weight: 226 lb (102.5 kg)  Height: 5\' 6"  (1.676 m)   Gen: well appearing obese male Cardiac: irregularly irregular, no m/r/g Pulm: CTAB, good air movement, normal wob Skin: scattered itchy papules on bilateral shins with excoriations. No active discharge, warmth, or redness  Assessment & Plan:   See Encounters Tab for problem based charting.  Patient discussed with Dr. Angelia Mould

## 2019-01-11 NOTE — Patient Instructions (Addendum)
It was nice seeing you today. Thank you for choosing [facility] for your Primary Care.   Today we talked about:  1) shortness of breath  - stop using your old inhaler   - pick up your new inhalers from the pharmacy  2) A. Fib, heart rate  - pick up metoprolol from your pharmacy and take this once a day  3) leg rash  - buy an over the counter anti itch cream from the store (Calamine or Elfrida)  - if you notice increased drainage, pain, or spreading of the rash come back and see me  FOLLOW-UP INSTRUCTIONS When: 3 months For: copd, a.fib  What to bring: all medications   Please contact the clinic if you have any problems, or need to be seen sooner.

## 2019-01-13 NOTE — Progress Notes (Signed)
Internal Medicine Clinic Attending  Case discussed with Dr. Vogel  at the time of the visit.  We reviewed the resident's history and exam and pertinent patient test results.  I agree with the assessment, diagnosis, and plan of care documented in the resident's note.  

## 2019-01-16 ENCOUNTER — Ambulatory Visit (INDEPENDENT_AMBULATORY_CARE_PROVIDER_SITE_OTHER): Payer: Medicare Other | Admitting: Pharmacist

## 2019-01-16 ENCOUNTER — Other Ambulatory Visit: Payer: Self-pay

## 2019-01-16 DIAGNOSIS — I4811 Longstanding persistent atrial fibrillation: Secondary | ICD-10-CM

## 2019-01-16 DIAGNOSIS — Z7901 Long term (current) use of anticoagulants: Secondary | ICD-10-CM | POA: Diagnosis not present

## 2019-01-16 DIAGNOSIS — Z5181 Encounter for therapeutic drug level monitoring: Secondary | ICD-10-CM

## 2019-01-16 DIAGNOSIS — I4891 Unspecified atrial fibrillation: Secondary | ICD-10-CM

## 2019-01-16 LAB — POCT INR: INR: 2.1 (ref 2.0–3.0)

## 2019-01-16 NOTE — Progress Notes (Signed)
INTERNAL MEDICINE TEACHING ATTENDING ADDENDUM  I agree with pharmacy recommendations as outlined in their note.   Alexander N Raines, MD  

## 2019-01-16 NOTE — Progress Notes (Signed)
Anticoagulation Management [name] is a 73 y.o. male who reports to the clinic for monitoring of warfarin treatment.    Indication: atrial fibrillation.   Duration: indefinite Supervising physician: Lenice Pressman, MD PhD  Anticoagulation Clinic Visit History: Patient does not report signs/symptoms of bleeding or thromboembolism  Other recent changes: No diet, medications, lifestyle changes endorsed.  Anticoagulation Episode Summary    Current INR goal:  2.0-3.0  TTR:  67.5 % (6.2 y)  Next INR check:  02/13/2019  INR from last check:  2.1 (01/16/2019)  Weekly max warfarin dose:    Target end date:  Indefinite  INR check location:  Anticoagulation Clinic  Preferred lab:    Send INR reminders to:     Indications   Atrial fibrillation (HCC) [I48.91]       Comments:          No Known Allergies  Current Outpatient Medications:  .  albuterol (PROAIR HFA) 108 (90 Base) MCG/ACT inhaler, Inhale 1-2 puffs into the lungs every 6 (six) hours as needed for wheezing or shortness of breath., Disp: 18 g, Rfl: 3 .  amiodarone (PACERONE) 200 MG tablet, Take 0.5 tablets (100 mg total) by mouth daily., Disp: 45 tablet, Rfl: 3 .  carvedilol (COREG) 3.125 MG tablet, Take 1 tablet (3.125 mg total) by mouth daily., Disp: 90 tablet, Rfl: 2 .  fluticasone (FLOVENT HFA) 44 MCG/ACT inhaler, Inhale 2 puffs into the lungs 2 (two) times daily as needed (sob). , Disp: , Rfl:  .  Fluticasone-Umeclidin-Vilant (TRELEGY ELLIPTA) 100-62.5-25 MCG/INH AEPB, Inhale 1 puff into the lungs daily., Disp: 60 each, Rfl: 5 .  folic acid (FOLVITE) 1 MG tablet, Take 1 tablet (1 mg total) by mouth daily., Disp: 90 tablet, Rfl: 3 .  hydroxyurea (HYDREA) 500 MG capsule, Take 2 capsules (1,000 mg total) by mouth daily. MAY TAKE WITH FOOD TO MINIMIZE GI SIDE EFFECTS, Disp: 180 capsule, Rfl: 3 .  ipratropium-albuterol (DUONEB) 0.5-2.5 (3) MG/3ML SOLN, TAKE 3 MLS BY MOUTH EVERY 4 HOURS AS NEEDED FOR SHORTNESS OF  BREATH (Patient taking differently: Inhale 3 mLs into the lungs every 6 (six) hours as needed (sob and wheezing). ), Disp: 5400 mL, Rfl: 3 .  SUBOXONE 8-2 MG FILM, Place 1 Film under the tongue daily. , Disp: , Rfl:  .  warfarin (COUMADIN) 2.5 MG tablet, Take one-half tablet on Monday, Wednesday, Friday and take one tablet all other days. (Patient taking differently: Take 1.25-2.5 mg by mouth See admin instructions. Take one-half tablet on Monday, Tues, Wed, Thursday and take one tablet all other days.), Disp: 24 tablet, Rfl: 2 .  furosemide (LASIX) 20 MG tablet, Take 1 tablet (20 mg total) by mouth daily., Disp: 90 tablet, Rfl: 3 Past Medical History:  Diagnosis Date  . Acute lower GI bleeding 02/15/2017  . Atrial fibrillation /flutter    hx of flutter ablation 2/12  . Cardiomyopathy    EF55% 11/14<<35%   . CHF (congestive heart failure) ([place])   . COPD (chronic obstructive pulmonary disease) ([place])   . GERD (gastroesophageal reflux disease)   . Gout   . Hepatitis    "think I had that once; a long time ago; from dirty needles I think" (12/14/2012)  . Hypertension   . Leukocytosis, unspecified 06/12/2013  . 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 Obesity   . Pneumonia X 1  . Primary polycythemia ([place]) 06/12/2013  . Renal insufficiency   . Substance abuse ([place])    alcohol; hx cocaine, heroin,  crack use  . Syncope    Social History   Socioeconomic History  . Marital status: Single    Spouse name: Not on file  . Number of children: Not on file  . Years of education: Not on file  . Highest education level: Not on file  Occupational History  . Occupation: Retired  Scientific laboratory technician  . Financial resource strain: Not on file  . Food insecurity    Worry: Not on file    Inability: Not on file  . Transportation needs    Medical: Not on file    Non-medical: Not on file  Tobacco Use  . Smoking status: Current Every Day Smoker    Packs/day: 0.10    Years: 50.00    Pack years: 5.00     Types: Cigarettes  . Smokeless tobacco: Never Used  Substance and Sexual Activity  . Alcohol use: Yes    Alcohol/week: 89.0 standard drinks    Types: 12 Cans of beer, 77 Shots of liquor per week    Comment: Sometimes.  . Drug use: Not Currently    Types: "Crack" cocaine, Cocaine, Heroin    Comment: 02/15/2017 "last used crack 4-5 years ago; last used heroin a couple years ago; currently denies any drug use-- on suboxone.   . Sexual activity: Not Currently  Lifestyle  . Physical activity    Days per week: Not on file    Minutes per session: Not on file  . Stress: Not on file  Relationships  . Social Herbalist on phone: Not on file    Gets together: Not on file    Attends religious service: Not on file    Active member of club or organization: Not on file    Attends meetings of clubs or organizations: Not on file    Relationship status: Not on file  Other Topics Concern  . Not on file  Social History Narrative   Moved here from [place]. Lives with common law wife and grandchildren. He is retired from "general labor."   Family History  Problem Relation Age of Onset  . Diabetes Mother   . Hypertension Mother   . Cirrhosis Father   . Alcohol abuse Father   . Colon cancer Neg Hx   . Rectal cancer Neg Hx   . Stomach cancer Neg Hx     ASSESSMENT Recent Results: The most recent result is correlated with 13.75 mg per week: Lab Results  Component Value Date   INR 2.1 01/16/2019   INR 2.1 (H) 12/19/2018   INR 2.0 (H) 12/17/2018    Anticoagulation Dosing: Description   Take one (1) tablet of your 2.5mg  green-colored warfarin tablets every day of the week, except take only one-half (1/2) tablet on TUESDAYS and FRIDAYS. Repeat this regimen weekly.     INR today: Therapeutic  PLAN Weekly dose was increased by 9% to 15 mg per week  Patient Instructions  Patient instructed to take medications as defined in the Anti-coagulation Track section of this encounter.   Patient instructed to take today's dose.  Patient instructed to take  one (1) tablet of your 2.5mg  green-colored warfarin tablets every day of the week, except take only one-half (1/2) tablet on TUESDAYS and [place] Patient verbalized understanding of these instructions.     Patient advised to contact clinic or seek medical attention if signs/symptoms of bleeding or thromboembolism occur.  Patient verbalized understanding by repeating back information and was advised to contact me if further  medication-related questions arise. Patient was also provided an information handout.  Follow-up Return in 4 weeks (on 02/13/2019) for Follow up INR.  Pennie Banter, PharmD, CPP  15 minutes spent face-to-face with the patient during the encounter. 50% of time spent on education, including signs/sx bleeding and clotting, as well as food and drug interactions with warfarin. 50% of time was spent on fingerprick POC INR sample collection,processing, results determination, and documentation in http://www.kim.net/.

## 2019-01-16 NOTE — Patient Instructions (Signed)
Patient instructed to take medications as defined in the Anti-coagulation Track section of this encounter.  Patient instructed to take today's dose.  Patient instructed to take  one (1) tablet of your 2.5mg  green-colored warfarin tablets every day of the week, except take only one-half (1/2) tablet on TUESDAYS and [place] Patient verbalized understanding of these instructions.

## 2019-01-28 ENCOUNTER — Other Ambulatory Visit: Payer: Self-pay | Admitting: Internal Medicine

## 2019-01-28 DIAGNOSIS — I4891 Unspecified atrial fibrillation: Secondary | ICD-10-CM

## 2019-02-08 ENCOUNTER — Other Ambulatory Visit: Payer: Self-pay | Admitting: Hematology and Oncology

## 2019-02-08 DIAGNOSIS — D45 Polycythemia vera: Secondary | ICD-10-CM

## 2019-02-09 ENCOUNTER — Other Ambulatory Visit: Payer: Self-pay

## 2019-02-09 ENCOUNTER — Emergency Department (HOSPITAL_COMMUNITY): Payer: Medicare Other

## 2019-02-09 ENCOUNTER — Inpatient Hospital Stay (HOSPITAL_COMMUNITY)
Admission: EM | Admit: 2019-02-09 | Discharge: 2019-02-13 | DRG: 208 | Disposition: A | Discharge status: Home Health | Payer: Medicare Other | Attending: Internal Medicine | Admitting: Internal Medicine

## 2019-02-09 DIAGNOSIS — J441 Chronic obstructive pulmonary disease with (acute) exacerbation: Secondary | ICD-10-CM | POA: Diagnosis present

## 2019-02-09 DIAGNOSIS — G9341 Metabolic encephalopathy: Secondary | ICD-10-CM | POA: Diagnosis present

## 2019-02-09 DIAGNOSIS — Z6837 Body mass index (BMI) 37.0-37.9, adult: Secondary | ICD-10-CM | POA: Diagnosis not present

## 2019-02-09 DIAGNOSIS — F1010 Alcohol abuse, uncomplicated: Secondary | ICD-10-CM | POA: Diagnosis present

## 2019-02-09 DIAGNOSIS — Z7901 Long term (current) use of anticoagulants: Secondary | ICD-10-CM | POA: Diagnosis not present

## 2019-02-09 DIAGNOSIS — I5043 Acute on chronic combined systolic (congestive) and diastolic (congestive) heart failure: Secondary | ICD-10-CM | POA: Diagnosis present

## 2019-02-09 DIAGNOSIS — Z885 Allergy status to narcotic agent status: Secondary | ICD-10-CM | POA: Diagnosis not present

## 2019-02-09 DIAGNOSIS — I509 Heart failure, unspecified: Secondary | ICD-10-CM

## 2019-02-09 DIAGNOSIS — I428 Other cardiomyopathies: Secondary | ICD-10-CM | POA: Diagnosis present

## 2019-02-09 DIAGNOSIS — I429 Cardiomyopathy, unspecified: Secondary | ICD-10-CM | POA: Diagnosis not present

## 2019-02-09 DIAGNOSIS — J9690 Respiratory failure, unspecified, unspecified whether with hypoxia or hypercapnia: Secondary | ICD-10-CM

## 2019-02-09 DIAGNOSIS — R269 Unspecified abnormalities of gait and mobility: Secondary | ICD-10-CM | POA: Diagnosis present

## 2019-02-09 DIAGNOSIS — J9621 Acute and chronic respiratory failure with hypoxia: Principal | ICD-10-CM | POA: Diagnosis present

## 2019-02-09 DIAGNOSIS — J9601 Acute respiratory failure with hypoxia: Secondary | ICD-10-CM | POA: Diagnosis present

## 2019-02-09 DIAGNOSIS — Z20828 Contact with and (suspected) exposure to other viral communicable diseases: Secondary | ICD-10-CM | POA: Diagnosis present

## 2019-02-09 DIAGNOSIS — E669 Obesity, unspecified: Secondary | ICD-10-CM | POA: Diagnosis present

## 2019-02-09 DIAGNOSIS — R5381 Other malaise: Secondary | ICD-10-CM | POA: Diagnosis present

## 2019-02-09 DIAGNOSIS — I959 Hypotension, unspecified: Secondary | ICD-10-CM | POA: Diagnosis present

## 2019-02-09 DIAGNOSIS — I4819 Other persistent atrial fibrillation: Secondary | ICD-10-CM | POA: Diagnosis not present

## 2019-02-09 DIAGNOSIS — J440 Chronic obstructive pulmonary disease with acute lower respiratory infection: Secondary | ICD-10-CM | POA: Diagnosis present

## 2019-02-09 DIAGNOSIS — J9622 Acute and chronic respiratory failure with hypercapnia: Secondary | ICD-10-CM | POA: Diagnosis present

## 2019-02-09 DIAGNOSIS — F17210 Nicotine dependence, cigarettes, uncomplicated: Secondary | ICD-10-CM | POA: Diagnosis present

## 2019-02-09 DIAGNOSIS — J189 Pneumonia, unspecified organism: Secondary | ICD-10-CM | POA: Diagnosis present

## 2019-02-09 DIAGNOSIS — N183 Chronic kidney disease, stage 3 (moderate): Secondary | ICD-10-CM | POA: Diagnosis present

## 2019-02-09 DIAGNOSIS — F1120 Opioid dependence, uncomplicated: Secondary | ICD-10-CM | POA: Diagnosis present

## 2019-02-09 DIAGNOSIS — Z8249 Family history of ischemic heart disease and other diseases of the circulatory system: Secondary | ICD-10-CM

## 2019-02-09 DIAGNOSIS — R9431 Abnormal electrocardiogram [ECG] [EKG]: Secondary | ICD-10-CM | POA: Diagnosis present

## 2019-02-09 DIAGNOSIS — J810 Acute pulmonary edema: Secondary | ICD-10-CM | POA: Diagnosis not present

## 2019-02-09 DIAGNOSIS — D45 Polycythemia vera: Secondary | ICD-10-CM | POA: Diagnosis present

## 2019-02-09 DIAGNOSIS — Z833 Family history of diabetes mellitus: Secondary | ICD-10-CM

## 2019-02-09 DIAGNOSIS — N179 Acute kidney failure, unspecified: Secondary | ICD-10-CM | POA: Diagnosis present

## 2019-02-09 DIAGNOSIS — I361 Nonrheumatic tricuspid (valve) insufficiency: Secondary | ICD-10-CM | POA: Diagnosis not present

## 2019-02-09 DIAGNOSIS — I340 Nonrheumatic mitral (valve) insufficiency: Secondary | ICD-10-CM | POA: Diagnosis not present

## 2019-02-09 DIAGNOSIS — J9811 Atelectasis: Secondary | ICD-10-CM

## 2019-02-09 DIAGNOSIS — Z8719 Personal history of other diseases of the digestive system: Secondary | ICD-10-CM

## 2019-02-09 DIAGNOSIS — Z7951 Long term (current) use of inhaled steroids: Secondary | ICD-10-CM

## 2019-02-09 DIAGNOSIS — I130 Hypertensive heart and chronic kidney disease with heart failure and stage 1 through stage 4 chronic kidney disease, or unspecified chronic kidney disease: Secondary | ICD-10-CM | POA: Diagnosis present

## 2019-02-09 DIAGNOSIS — R451 Restlessness and agitation: Secondary | ICD-10-CM | POA: Diagnosis present

## 2019-02-09 DIAGNOSIS — Z79899 Other long term (current) drug therapy: Secondary | ICD-10-CM

## 2019-02-09 HISTORY — DX: Essential (primary) hypertension: I10

## 2019-02-09 HISTORY — DX: Personal history of other diseases of the circulatory system: Z86.79

## 2019-02-09 LAB — CBC WITH DIFFERENTIAL/PLATELET
Abs Immature Granulocytes: 0 10*3/uL (ref 0.00–0.07)
Basophils Absolute: 0 10*3/uL (ref 0.0–0.1)
Basophils Relative: 0 %
Eosinophils Absolute: 0.1 10*3/uL (ref 0.0–0.5)
Eosinophils Relative: 1 %
HCT: 47.1 % (ref 39.0–52.0)
Hemoglobin: 15.9 g/dL (ref 13.0–17.0)
Lymphocytes Relative: 30 %
Lymphs Abs: 2.4 10*3/uL (ref 0.7–4.0)
MCH: 41.6 pg — ABNORMAL HIGH (ref 26.0–34.0)
MCHC: 33.8 g/dL (ref 30.0–36.0)
MCV: 123.3 fL — ABNORMAL HIGH (ref 80.0–100.0)
Monocytes Absolute: 0.1 10*3/uL (ref 0.1–1.0)
Monocytes Relative: 1 %
Neutro Abs: 5.5 10*3/uL (ref 1.7–7.7)
Neutrophils Relative %: 68 %
Platelets: 188 10*3/uL (ref 150–400)
RBC: 3.82 MIL/uL — ABNORMAL LOW (ref 4.22–5.81)
RDW: 13.8 % (ref 11.5–15.5)
WBC: 8.1 10*3/uL (ref 4.0–10.5)
nRBC: 0 /100 WBC
nRBC: 0.4 % — ABNORMAL HIGH (ref 0.0–0.2)

## 2019-02-09 LAB — GLUCOSE, CAPILLARY: Glucose-Capillary: 147 mg/dL — ABNORMAL HIGH (ref 70–99)

## 2019-02-09 LAB — POCT I-STAT 7, (LYTES, BLD GAS, ICA,H+H)
Acid-Base Excess: 2 mmol/L (ref 0.0–2.0)
Acid-base deficit: 2 mmol/L (ref 0.0–2.0)
Bicarbonate: 27.2 mmol/L (ref 20.0–28.0)
Bicarbonate: 30.2 mmol/L — ABNORMAL HIGH (ref 20.0–28.0)
Calcium, Ion: 1.06 mmol/L — ABNORMAL LOW (ref 1.15–1.40)
Calcium, Ion: 1.14 mmol/L — ABNORMAL LOW (ref 1.15–1.40)
HCT: 48 % (ref 39.0–52.0)
HCT: 46 % (ref 39.0–52.0)
Hemoglobin: 16.3 g/dL (ref 13.0–17.0)
Hemoglobin: 15.6 g/dL (ref 13.0–17.0)
O2 Saturation: 92 %
O2 Saturation: 86 %
Patient temperature: 97.8
Patient temperature: 98.5
Potassium: 3.5 mmol/L (ref 3.5–5.1)
Potassium: 3.5 mmol/L (ref 3.5–5.1)
Sodium: 142 mmol/L (ref 135–145)
Sodium: 141 mmol/L (ref 135–145)
TCO2: 29 mmol/L (ref 22–32)
TCO2: 32 mmol/L (ref 22–32)
pCO2 arterial: 60.4 mmHg — ABNORMAL HIGH (ref 32.0–48.0)
pCO2 arterial: 62.7 mmHg — ABNORMAL HIGH (ref 32.0–48.0)
pH, Arterial: 7.259 — ABNORMAL LOW (ref 7.350–7.450)
pH, Arterial: 7.291 — ABNORMAL LOW (ref 7.350–7.450)
pO2, Arterial: 73 mmHg — ABNORMAL LOW (ref 83.0–108.0)
pO2, Arterial: 59 mmHg — ABNORMAL LOW (ref 83.0–108.0)

## 2019-02-09 LAB — COMPREHENSIVE METABOLIC PANEL
ALT: 16 U/L (ref 0–44)
AST: 38 U/L (ref 15–41)
Albumin: 3.3 g/dL — ABNORMAL LOW (ref 3.5–5.0)
Alkaline Phosphatase: 101 U/L (ref 38–126)
Anion gap: 15 (ref 5–15)
BUN: 25 mg/dL — ABNORMAL HIGH (ref 8–23)
CO2: 21 mmol/L — ABNORMAL LOW (ref 22–32)
Calcium: 7.7 mg/dL — ABNORMAL LOW (ref 8.9–10.3)
Chloride: 102 mmol/L (ref 98–111)
Creatinine, Ser: 1.89 mg/dL — ABNORMAL HIGH (ref 0.61–1.24)
GFR calc Af Amer: 40 mL/min — ABNORMAL LOW (ref >60)
GFR calc non Af Amer: 35 mL/min — ABNORMAL LOW (ref >60)
Glucose, Bld: 217 mg/dL — ABNORMAL HIGH (ref 70–99)
Potassium: 3.9 mmol/L (ref 3.5–5.1)
Sodium: 138 mmol/L (ref 135–145)
Total Bilirubin: 0.7 mg/dL (ref 0.3–1.2)
Total Protein: 6.6 g/dL (ref 6.5–8.1)

## 2019-02-09 LAB — TROPONIN I (HIGH SENSITIVITY)
Troponin I (High Sensitivity): 18 ng/L — ABNORMAL HIGH (ref <18)
Troponin I (High Sensitivity): 45 ng/L — ABNORMAL HIGH (ref <18)

## 2019-02-09 LAB — TRIGLYCERIDES: Triglycerides: 185 mg/dL — ABNORMAL HIGH (ref <150)

## 2019-02-09 LAB — BRAIN NATRIURETIC PEPTIDE: B Natriuretic Peptide: 419.9 pg/mL — ABNORMAL HIGH (ref 0.0–100.0)

## 2019-02-09 LAB — SARS CORONAVIRUS 2 BY RT PCR (HOSPITAL ORDER, PERFORMED IN ~~LOC~~ HOSPITAL LAB): SARS Coronavirus 2: NEGATIVE

## 2019-02-09 MED ORDER — FENTANYL BOLUS VIA INFUSION: 25.0000 ug | INTRAVENOUS | Status: DC | PRN

## 2019-02-09 MED ORDER — MIDAZOLAM HCL 2 MG/2ML IJ SOLN
1.0000 mg | INTRAMUSCULAR | Status: DC | PRN
  Administered 2019-02-09: 1 mg via INTRAVENOUS

## 2019-02-09 MED ORDER — ALBUTEROL (5 MG/ML) CONTINUOUS INHALATION SOLN
10.0000 mg/h | INHALATION_SOLUTION | Freq: Once | RESPIRATORY_TRACT | Status: AC
  Administered 2019-02-09: 10 mg/h via RESPIRATORY_TRACT
  Filled 2019-02-09: qty 20

## 2019-02-09 MED ORDER — AMIODARONE HCL 100 MG PO TABS
100.0000 mg | ORAL_TABLET | Freq: Every day | ORAL | Status: DC
  Administered 2019-02-10 – 2019-02-13 (×4): 100 mg via ORAL
  Filled 2019-02-09 (×4): qty 1

## 2019-02-09 MED ORDER — FENTANYL CITRATE (PF) 100 MCG/2ML IJ SOLN
100.0000 ug | Freq: Once | INTRAMUSCULAR | Status: DC
  Administered 2019-02-09: 100 ug via INTRAVENOUS
  Filled 2019-02-09: qty 2

## 2019-02-09 MED ORDER — CHLORHEXIDINE GLUCONATE 0.12% ORAL RINSE (MEDLINE KIT): 15.0000 mL | Freq: Two times a day (BID) | OROMUCOSAL | Status: DC

## 2019-02-09 MED ORDER — METHYLPREDNISOLONE SODIUM SUCC 125 MG IJ SOLR: 60.0000 mg | Freq: Three times a day (TID) | INTRAMUSCULAR | Status: DC

## 2019-02-09 MED ORDER — PANTOPRAZOLE SODIUM 40 MG IV SOLR: 40.0000 mg | Freq: Every day | INTRAVENOUS | Status: DC

## 2019-02-09 MED ORDER — IPRATROPIUM-ALBUTEROL 0.5-2.5 (3) MG/3ML IN SOLN
3.0000 mL | Freq: Four times a day (QID) | RESPIRATORY_TRACT | Status: DC
  Administered 2019-02-09 – 2019-02-10 (×3): 3 mL via RESPIRATORY_TRACT
  Filled 2019-02-09 (×3): qty 3

## 2019-02-09 MED ORDER — VECURONIUM BOLUS VIA INFUSION: 5.0000 mg | Freq: Once | INTRAVENOUS | Status: DC

## 2019-02-09 MED ORDER — MIDAZOLAM 50MG/50ML (1MG/ML) PREMIX INFUSION: 2.0000 mg/h | INTRAVENOUS | Status: DC

## 2019-02-09 MED ORDER — FENTANYL 2500MCG IN NS 250ML (10MCG/ML) PREMIX INFUSION
0.0000 ug/h | INTRAVENOUS | Status: DC
  Administered 2019-02-09: 100 ug/h via INTRAVENOUS
  Administered 2019-02-09: 25 ug/h via INTRAVENOUS
  Administered 2019-02-10: 250 ug/h via INTRAVENOUS
  Filled 2019-02-09 (×2): qty 250

## 2019-02-09 MED ORDER — HEPARIN SODIUM (PORCINE) 5000 UNIT/ML IJ SOLN: 5000.0000 [IU] | Freq: Three times a day (TID) | INTRAMUSCULAR | Status: DC

## 2019-02-09 MED ORDER — ROCURONIUM BROMIDE 50 MG/5ML IV SOLN
INTRAVENOUS | Status: AC | PRN
  Administered 2019-02-09: 100 mg via INTRAVENOUS

## 2019-02-09 MED ORDER — PROPOFOL 1000 MG/100ML IV EMUL
INTRAVENOUS | Status: AC
  Filled 2019-02-09: qty 100

## 2019-02-09 MED ORDER — MIDAZOLAM HCL 2 MG/2ML IJ SOLN
1.0000 mg | INTRAMUSCULAR | Status: DC | PRN
  Filled 2019-02-09: qty 2

## 2019-02-09 MED ORDER — FENTANYL CITRATE (PF) 100 MCG/2ML IJ SOLN: 25.0000 ug | Freq: Once | INTRAMUSCULAR | Status: DC

## 2019-02-09 MED ORDER — FENTANYL 2500MCG IN NS 250ML (10MCG/ML) PREMIX INFUSION: 25.0000 ug/h | INTRAVENOUS | Status: DC

## 2019-02-09 MED ORDER — MIDAZOLAM HCL 2 MG/2ML IJ SOLN: 2.0000 mg | INTRAMUSCULAR | Status: DC | PRN

## 2019-02-09 MED ORDER — ORAL CARE MOUTH RINSE: 15.0000 mL | OROMUCOSAL | Status: DC

## 2019-02-09 MED ORDER — BUDESONIDE 0.5 MG/2ML IN SUSP
0.5000 mg | Freq: Two times a day (BID) | RESPIRATORY_TRACT | Status: DC
  Administered 2019-02-10 (×2): 0.5 mg via RESPIRATORY_TRACT
  Filled 2019-02-09 (×3): qty 2

## 2019-02-09 MED ORDER — ETOMIDATE 2 MG/ML IV SOLN
INTRAVENOUS | Status: AC | PRN
  Administered 2019-02-09: 30 mg via INTRAVENOUS

## 2019-02-09 MED ORDER — ARTIFICIAL TEARS OPHTHALMIC OINT: 1.0000 "application " | TOPICAL_OINTMENT | Freq: Three times a day (TID) | OPHTHALMIC | Status: DC

## 2019-02-09 MED ORDER — PROPOFOL 1000 MG/100ML IV EMUL
5.0000 ug/kg/min | INTRAVENOUS | Status: DC
  Administered 2019-02-09: 5 ug/kg/min via INTRAVENOUS
  Administered 2019-02-09: 15 ug/kg/min via INTRAVENOUS
  Administered 2019-02-09 (×2): 10 ug/kg/min via INTRAVENOUS

## 2019-02-09 MED ORDER — VECURONIUM BROMIDE 10 MG IV SOLR: 0.0000 ug/kg/min | INTRAVENOUS | Status: DC

## 2019-02-09 MED ORDER — MIDAZOLAM HCL 2 MG/2ML IJ SOLN
1.0000 mg | INTRAMUSCULAR | Status: DC | PRN
  Administered 2019-02-10: 2 mg via INTRAVENOUS
  Administered 2019-02-10: 1 mg via INTRAVENOUS
  Administered 2019-02-10: 2 mg via INTRAVENOUS
  Filled 2019-02-09 (×3): qty 2

## 2019-02-09 MED ORDER — INSULIN ASPART 100 UNIT/ML ~~LOC~~ SOLN
0.0000 [IU] | SUBCUTANEOUS | Status: DC
  Administered 2019-02-10: 3 [IU] via SUBCUTANEOUS
  Administered 2019-02-10 (×2): 2 [IU] via SUBCUTANEOUS
  Administered 2019-02-10: 3 [IU] via SUBCUTANEOUS

## 2019-02-09 MED ORDER — MIDAZOLAM BOLUS VIA INFUSION: 1.0000 mg | INTRAVENOUS | Status: DC | PRN

## 2019-02-09 MED ORDER — PROPOFOL 1000 MG/100ML IV EMUL
5.0000 ug/kg/min | INTRAVENOUS | Status: DC
  Administered 2019-02-09: 18:00:00 10 ug/kg/min via INTRAVENOUS

## 2019-02-09 NOTE — ED Notes (Signed)
Date and time results received: 02/09/19 2037 (use smartphrase ".now" to insert current time)  Test: troponin Critical Value: 45  Name of Provider Notified: Dr. Jeral Fruit (resident)  Orders Received? Or Actions Taken?: Actions Taken: awaiting orders

## 2019-02-09 NOTE — ED Notes (Signed)
Pt's wife at the bedside.

## 2019-02-09 NOTE — Progress Notes (Signed)
RT note: RT and RN transported patient on ventilator from ED to 2M12. Vital signs stable through out.

## 2019-02-09 NOTE — ED Notes (Signed)
Applied bair hugger ?

## 2019-02-09 NOTE — ED Notes (Signed)
Removed bair hugger. Pt is normotensive

## 2019-02-09 NOTE — ED Provider Notes (Signed)
[facility] EMERGENCY DEPARTMENT Provider Note   CSN: [id number] Arrival date & time: 02/09/19  1730    History   Chief Complaint Chief Complaint  Patient presents with  . Respiratory Distress    HPI [name] is a 73 y.o. male.     HPI  Patient is a 73 year old male with past medical history of A. fib/flutter, HFrEF,  Obesity and COPD who presents to the emergency department today by EMS for evaluation of acute onset severe respiratory distress.  EMS reports the patient was with his wife in a drive-through at [employer] when he had sudden onset shortness of breath.  On EMS arrival, he had oxygen saturations in the 60s with altered mental status and was diaphoretic.  EMS inserted a nasopharyngeal airway and was assisting his ventilations with bag valve mask and 100% oxygen.  No further history provided by the patient due to acute clinical state/severity of condition. I did (later) receive additional history from the patient's wife, who states that she was actually seen in this emergency department earlier today, and the patient had been waiting for her.  Once they left here they stopped by a liquor store, where this patient had gone inside and when coming back to the car the patient was hurrying/wrestling as it was raining and in doing so he became short of breath.  She states that on getting back into the vehicle, he tried to use his inhaler several times, though did not get any marked improvement, and continued to become more and more short of breath.  Past Medical History:  Diagnosis Date  . Acute lower GI bleeding 02/15/2017  . Atrial fibrillation /flutter    hx of flutter ablation 2/12  . Cardiomyopathy    EF55% 11/14<<35%   . CHF (congestive heart failure) ([place])   . COPD (chronic obstructive pulmonary disease) ([place])   . GERD (gastroesophageal reflux disease)   . Gout   . Hepatitis    "think I had that once; a long time ago; from dirty  needles I think" (12/14/2012)  . Hypertension   . Leukocytosis, unspecified 06/12/2013  . 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 Obesity   . Pneumonia X 1  . Primary polycythemia ([place]) 06/12/2013  . Renal insufficiency   . Substance abuse ([place])    alcohol; hx cocaine, heroin, crack use  . Syncope     Patient Active Problem List   Diagnosis Date Noted  . Acute hypoxemic respiratory failure ([place]) 02/09/2019  . COPD exacerbation ([place]) 04/12/2018  . Elevated troponin 04/12/2018  . Long term current use of amiodarone 01/24/2018  . GI bleed 02/15/2017  . Pancytopenia, acquired ([place]) 11/26/2016  . Tubular adenoma 11/25/2016  . Gout 11/02/2016  . Preventive measure 05/25/2016  . Hepatitis C antibody test positive   . Hematuria 11/09/2014  . Acute kidney injury ([place]) 11/01/2014  . Leukopenia due to antineoplastic chemotherapy ([place]) 08/24/2014  . Thrombocytopenia due to drugs 07/25/2014  . Encounter for screening colonoscopy 07/02/2014  . Chronic anticoagulation 07/02/2014  . Acute pulmonary edema ([place]) 06/05/2014  . Prolonged QT interval 06/05/2014  . Sinus bradycardia 06/05/2014  . Opioid dependence ([place]) 06/05/2014  . Diastolic dysfunction 22/08/5425  . COPD (chronic obstructive pulmonary disease) ([place]) 05/30/2014  . Health care maintenance 05/24/2014  . Anemia due to antineoplastic chemotherapy 04/24/2014  . Thyroid mass 08/26/2013  . Syncope 08/22/2013  . 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 Polycythemia vera ([place]) 01/03/2013  . Malnutrition of moderate degree (  [place]) 12/15/2012  . Tobacco abuse 09/15/2012  . Heart failure with preserved left ventricular function (HFpEF) ([place])   . Alcoholism ([place]) 09/30/2010  . Hypertension 09/30/2010  . Atrial fibrillation [facility])     Past Surgical History:  Procedure Laterality Date  . CARDIAC ELECTROPHYSIOLOGY MAPPING AND ABLATION  08/2010   [name] 09/07/2010 (12/14/2012)  . COLONOSCOPY     15-20 years ago had colon in  [place]  . EXCISIONAL HEMORRHOIDECTOMY  1970's  . LOOP RECORDER IMPLANT N/A 08/23/2013   Procedure: LOOP RECORDER IMPLANT;  Surgeon: Deboraha Sprang, MD;  Location: [facility] CATH LAB;  Service: Cardiovascular;  Laterality: N/A;  . MULTIPLE EXTRACTIONS WITH ALVEOLOPLASTY Bilateral 01/24/2016   Procedure: MULTIPLE EXTRACTION WITH ALVEOLOPLASTY BILATERAL;  Surgeon: Diona Browner, DDS;  Location: [facility];  Service: Oral Surgery;  Laterality: Bilateral;  . MULTIPLE TOOTH EXTRACTIONS  01/24/2016   MULTIPLE EXTRACTION WITH ALVEOLOPLASTY BILATERAL (Bilateral)        Home Medications    Prior to Admission medications   Medication Sig Start Date End Date Taking? Authorizing Provider  albuterol (PROAIR HFA) 108 (90 Base) MCG/ACT inhaler Inhale 1-2 puffs into the lungs every 6 (six) hours as needed for wheezing or shortness of breath. 01/11/19   [name], MD  amiodarone (PACERONE) 200 MG tablet Take 0.5 tablets (100 mg total) by mouth daily. 06/20/18   Chanetta Marshall K, NP  carvedilol (COREG) 3.125 MG tablet Take 1 tablet (3.125 mg total) by mouth daily. 01/12/19   [name], MD  fluticasone (FLOVENT HFA) 44 MCG/ACT inhaler Inhale 2 puffs into the lungs 2 (two) times daily as needed (sob).     [provider]  Fluticasone-Umeclidin-Vilant (TRELEGY ELLIPTA) 100-62.5-25 MCG/INH AEPB Inhale 1 puff into the lungs daily. 01/11/19   [name], MD  folic acid (FOLVITE) 1 MG tablet Take 1 tablet (1 mg total) by mouth daily. 09/02/18   [name], MD  furosemide (LASIX) 20 MG tablet Take 1 tablet (20 mg total) by mouth daily. 03/17/18 02/09/19  Patsey Berthold, NP  hydroxyurea (HYDREA) 500 MG capsule Take 2 capsules (1,000 mg total) by mouth daily. MAY TAKE WITH FOOD TO MINIMIZE GI SIDE EFFECTS 05/20/18   [name], MD  ipratropium-albuterol (DUONEB) 0.5-2.5 (3) MG/3ML SOLN TAKE 3 MLS BY MOUTH EVERY 4 HOURS AS NEEDED FOR SHORTNESS OF BREATH Patient taking differently: Inhale 3 mLs into the lungs every 6  (six) hours as needed (for shortness of breath or wheezing).  04/15/18   [name], MD  SUBOXONE 8-2 MG FILM Place 1 Film under the tongue daily.  08/21/15   [provider]  warfarin (COUMADIN) 2.5 MG tablet TAKE 1/2 TABLET BY MOUTH ON TUESDAY AND FRIDAY. TAKE 1 TABLET DAILY ON THE OTHER DAYS 01/30/19   Aldine Contes, MD    Family History Family History  Problem Relation Age of Onset  . Diabetes Mother   . Hypertension Mother   . Cirrhosis Father   . Alcohol abuse Father   . Colon cancer Neg Hx   . Rectal cancer Neg Hx   . Stomach cancer Neg Hx     Social History Social History   Tobacco Use  . Smoking status: Current Every Day Smoker    Packs/day: 0.10    Years: 50.00    Pack years: 5.00    Types: Cigarettes  . Smokeless tobacco: Never Used  Substance Use Topics  . Alcohol use: Yes    Alcohol/week: 89.0 standard  drinks    Types: 12 Cans of beer, 77 Shots of liquor per week    Comment: Sometimes.  . Drug use: Not Currently    Types: "Crack" cocaine, Cocaine, Heroin    Comment: 02/15/2017 "last used crack 4-5 years ago; last used heroin a couple years ago; currently denies any drug use-- on suboxone.      Allergies   Patient has no known allergies.   Review of Systems Review of Systems  Unable to perform ROS: Acuity of condition     Physical Exam Updated Vital Signs BP 100/62   Pulse 71   Temp 98 F (36.7 C)   Resp 18   Ht 5\' 9"  (1.753 m)   Wt 99.8 kg   SpO2 100%   BMI 32.49 kg/m   Physical Exam Vitals signs and nursing note reviewed.  Constitutional:      General: He is in acute distress.     Appearance: He is obese. He is ill-appearing, toxic-appearing and diaphoretic.  HENT:     Head: Normocephalic.     Right Ear: External ear normal.     Left Ear: External ear normal.     Nose: Nose normal.     Comments: NPA in place in left nostril by EMS PTA.    Mouth/Throat:     Mouth: Mucous membranes are moist.  Eyes:     General: No  scleral icterus.    Pupils: Pupils are equal, round, and reactive to light.     Comments: Injected conjunctivae bilaterally  Neck:     Vascular: JVD present.  Cardiovascular:     Rate and Rhythm: Tachycardia present.     Comments: No obvious pitting edema to extremities.  Pulmonary:     Effort: Accessory muscle usage and respiratory distress present.     Comments: Severe respiratory distress, ventilations being assisted by BVM by EMS, patient with significant accessory muscle use.  Air entry is significantly diminished. Abdominal:     General: Abdomen is protuberant.     Tenderness: There is no abdominal tenderness.  Musculoskeletal: Normal range of motion.  Skin:    General: Skin is warm.     Capillary Refill: Capillary refill takes less than 2 seconds.  Neurological:     General: No focal deficit present.      ED Treatments / Results  Labs (all labs ordered are listed, but only abnormal results are displayed) Labs Reviewed  COMPREHENSIVE METABOLIC PANEL - Abnormal; Notable for the following components:      Result Value   CO2 21 (*)    Glucose, Bld 217 (*)    BUN 25 (*)    Creatinine, Ser 1.89 (*)    Calcium 7.7 (*)    Albumin 3.3 (*)    GFR calc non Af Amer 35 (*)    GFR calc Af Amer 40 (*)    All other components within normal limits  CBC WITH DIFFERENTIAL/PLATELET - Abnormal; Notable for the following components:   RBC 3.82 (*)    MCV 123.3 (*)    MCH 41.6 (*)    nRBC 0.4 (*)    All other components within normal limits  TRIGLYCERIDES - Abnormal; Notable for the following components:   Triglycerides 185 (*)    All other components within normal limits  BRAIN NATRIURETIC PEPTIDE - Abnormal; Notable for the following components:   B Natriuretic Peptide 419.9 (*)    All other components within normal limits  GLUCOSE, CAPILLARY - Abnormal; Notable for  the following components:   Glucose-Capillary 147 (*)    All other components within normal limits  POCT I-STAT  7, (LYTES, BLD GAS, ICA,H+H) - Abnormal; Notable for the following components:   pH, Arterial 7.259 (*)    pCO2 arterial 60.4 (*)    pO2, Arterial 73.0 (*)    Calcium, Ion 1.06 (*)    All other components within normal limits  POCT I-STAT 7, (LYTES, BLD GAS, ICA,H+H) - Abnormal; Notable for the following components:   pH, Arterial 7.291 (*)    pCO2 arterial 62.7 (*)    pO2, Arterial 59.0 (*)    Bicarbonate 30.2 (*)    Calcium, Ion 1.14 (*)    All other components within normal limits  TROPONIN I (HIGH SENSITIVITY) - Abnormal; Notable for the following components:   Troponin I (High Sensitivity) 18 (*)    All other components within normal limits  TROPONIN I (HIGH SENSITIVITY) - Abnormal; Notable for the following components:   Troponin I (High Sensitivity) 45 (*)    All other components within normal limits  SARS CORONAVIRUS 2 (HOSPITAL ORDER, [facility])  MRSA PCR SCREENING  CBC  BASIC METABOLIC PANEL  MAGNESIUM  PHOSPHORUS  HEMOGLOBIN A1C  PROTIME-INR  TRIGLYCERIDES  I-STAT ARTERIAL BLOOD GAS, ED  I-STAT ARTERIAL BLOOD GAS, ED    EKG EKG Interpretation  Date/Time:  Thursday February 09 2019 17:31:30 EDT Ventricular Rate:  122 PR Interval:    QRS Duration: 91 QT Interval:  366 QTC Calculation: 522 R Axis:   41 Text Interpretation:  Sinus or ectopic atrial tachycardia Multiple ventricular premature complexes Probable left atrial enlargement Prolonged QT interval When comapred toprior, similarly long QTC,  more PVC no STEMI Confirmed by Antony Blackbird [phone]) on 02/09/2019 5:42:19 PM   Radiology Dg Chest Portable 1 View  Result Date: 02/09/2019 CLINICAL DATA:  Endotracheal tube placement and orogastric tube placement. EXAM: PORTABLE CHEST 1 VIEW COMPARISON:  Chest radiograph performed 12/17/2018 FINDINGS: The patient's endotracheal tube is seen ending 2 cm above the carina. The enteric tube is seen extending below the diaphragm. Vascular congestion  is noted. Vague hazy opacities are noted bilaterally; given clinical concern, COVID-19 cannot be excluded. Pulmonary edema could conceivably have a similar appearance. No significant pleural effusion or pneumothorax is seen. The cardiomediastinal silhouette is mildly enlarged. No acute osseous abnormalities are identified. IMPRESSION: 1. Endotracheal tube seen ending 2 cm above the carina. 2. Enteric tube noted extending below the diaphragm. 3. Vague hazy airspace opacities noted bilaterally. Given clinical concern, COVID-19 cannot be excluded. Pulmonary edema could conceivably have a similar appearance. 4. Vascular congestion and mild cardiomegaly. Electronically Signed   By: Garald Balding M.D.   On: 02/09/2019 18:04    Procedures Procedure Name: Intubation Date/Time: 02/09/2019 5:40 PM Performed by: Jefm Petty, MD Pre-anesthesia Checklist: Patient identified, Emergency Drugs available, Suction available, Patient being monitored and Timeout performed Oxygen Delivery Method: Non-rebreather mask Preoxygenation: Pre-oxygenation with 100% oxygen Induction Type: Rapid sequence Ventilation: Mask ventilation without difficulty Laryngoscope Size: Glidescope Grade View: Grade I Tube type: Subglottic suction tube Tube size: 8.0 mm Number of attempts: 1 Airway Equipment and Method: Rigid stylet and Video-laryngoscopy Placement Confirmation: ETT inserted through vocal cords under direct vision,  Positive ETCO2,  Breath sounds checked- equal and bilateral and CO2 detector Secured at: 24 cm Tube secured with: ETT holder      (including critical care time)  Medications Ordered in ED Medications  propofol (DIPRIVAN) 1000 MG/100ML  infusion (has no administration in time range)  ipratropium-albuterol (DUONEB) 0.5-2.5 (3) MG/3ML nebulizer solution 3 mL (3 mLs Nebulization Given 02/09/19 1954)  pantoprazole (PROTONIX) injection 40 mg (has no administration in time range)  fentaNYL 2582mcg in NS  224mL (76mcg/ml) infusion-PREMIX (50 mcg/hr Intravenous Rate/Dose Change 02/09/19 2231)  insulin aspart (novoLOG) injection 0-15 Units (2 Units Subcutaneous Given 02/10/19 0004)  amiodarone (PACERONE) tablet 100 mg (has no administration in time range)  budesonide (PULMICORT) nebulizer solution 0.5 mg (has no administration in time range)  midazolam (VERSED) injection 1-2 mg (1 mg Intravenous Given 02/10/19 0011)  etomidate (AMIDATE) injection (30 mg Intravenous Given 02/09/19 1725)  rocuronium (ZEMURON) injection (100 mg Intravenous Given 02/09/19 1726)  albuterol (PROVENTIL,VENTOLIN) solution continuous neb (10 mg/hr Nebulization Given 02/09/19 2012)     Initial Impression / Assessment and Plan / ED Course  I have reviewed the triage vital signs and the nursing notes.  Pertinent labs & imaging results that were available during my care of the patient were reviewed by me and considered in my medical decision making (see chart for details).  Of note, this patient was evaluated in the Emergency Department for the symptoms described in the history of present illness. He was evaluated in the context of the global COVID-19 pandemic, which necessitated consideration that the patient might be at risk for infection with the SARS-CoV-2 virus that causes COVID-19. Institutional protocols and algorithms that pertain to the evaluation of patients at risk for COVID-19 are in a state of rapid change based on information released by regulatory bodies including the CDC and federal and state organizations. These policies and algorithms were followed during the patient's care in the ED.  During this patient encounter, the patient was wearing a mask, and throughout this encounter I was wearing at least a surgical mask.  I was not within 6 feet of this patient for more than 15 minutes without eye protection when they were not wearing mask.   Differentials considered: CAP, COPD exacerbation, heart failure exacerbation,  ACS, atypical viral infection including COVID-19  EM Physician interpretation of Labs & Imaging: . CBC without leukocytosis, no significant anemia and normal platelet . Metabolic panel with decreased bicarb (CO2 21), hyperglycemia with glucose 217, there is presence of an AKI, with BUN of 25 and creatinine 1.89, this patient's creatinine at baseline appears to be around 1.2. .  i-STAT arterial blood gas obtained just after the patient was intubated and showed a pH of 7.259, PCO2 of 60.4, PaO2 of 73, HC03 of 27.2 . Elevated serum BNP of 419 . Elevated initial serum troponin of 18  Medical Decision Making:  [name] is a 73 y.o. male with past medical history of COPD, GERD, morbid obesity, heart failure with reduced ejection fraction who presented to the emergency department today in acute hypoxic respiratory failure that was sudden onset while sitting in his vehicle with his wife at [employer].  He was being bagged by EMS on arrival to the ED.  Patient was diaphoretic and had significant increased work of breathing.  He appeared fatigued and became increasingly tired with attempts at (self) respiring.  Decision made by the team to intubate the patient as he was becoming more and more fatigued and already having to be assisted by BVM.   Patient successfully intubated by RSI with first attempt.  Obtained i-STAT arterial blood gas following elevation, repeat of this again, 2 hours later.  Adjustments to vent settings discussed with respiratory, increased PEEP and  patient being given continuous albuterol nebulized treatment through the vent.  Case discussed with the pulmonary/critical care medicine team who have accepted the patient for admission to the ICU.  The plan for this patient was discussed with my attending physician, Dr. Antony Blackbird, who voiced agreement and who oversaw evaluation and treatment of this patient.   CLINICAL IMPRESSION: 1. Acute respiratory failure with  hypoxia and hypercapnia (HCC)   2. COPD exacerbation ([place])   3. Acute on chronic congestive heart failure, unspecified heart failure type ([place])      Disposition: Admit   Alvon Nygaard A. Jimmye Norman, MD Resident Physician, PGY-3 Emergency Medicine [facility]    Jefm Petty, MD 02/10/19 0113    Tegeler, Gwenyth Allegra, MD 02/12/19 1347    Tegeler, Gwenyth Allegra, MD 02/12/19 [phone]

## 2019-02-09 NOTE — ED Notes (Signed)
This RN spoke with Nanci to give update

## 2019-02-09 NOTE — Progress Notes (Signed)
[facility] Progress Note Patient Name: [name] DOB: Feb 18, 1946 MRN: [id number]   Date of Service  02/09/2019  HPI/Events of Note  Pt needs versed order  eICU Interventions  New pt evaluation completed, Versed order optimized.        Kerry Kass Ogan 02/09/2019, 11:55 PM

## 2019-02-09 NOTE — ED Notes (Signed)
Called main lab to add on BNP, Triglycerides, and troponin

## 2019-02-09 NOTE — H&P (Signed)
NAME:  [name], MRN:  [id number], DOB:  03-03-1946, LOS: 0 ADMISSION DATE:  02/09/2019, CONSULTATION DATE: 7/23 REFERRING MD: Dr. Sherry Ruffing, CHIEF COMPLAINT: Hypoxic  Brief History   73 year old male presented to ED in respiratory distress on 7/23 and intubated for hypoxia.  Presumed pulmonary edema.  History of present illness   Patient is encephalopathic and/or intubated. Therefore history has been obtained from chart review.  73 year old male with past medical history as below, which is significant for atrial fibrillation on warfarin, heart failure with reduced ejection fraction, COPD, and obesity.  While sitting in the parking lot of [name] with his wife he developed respiratory distress.  EMS was dispatched and upon their arrival his oxygen saturations were in the 60s requiring bag-valve-mask ventilations.  Work of breathing was evident and he became fatigued upon arrival to the emergency department where he was intubated.  Chest x-ray demonstrated bilateral infiltrates.  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   Significant Hospital Events   7/23 admit  Consults:    Procedures:  ETT 7/23 >  Significant Diagnostic Tests:  COVID 7/23 > neg  Micro Data:    Antimicrobials:    Interim history/subjective:    Objective   Blood pressure 106/69, pulse 76, temperature 98.1 F (36.7 C), resp. rate (!) 22, height 5\' 9"  (1.753 m), weight 99.8 kg, SpO2 97 %.    Vent Mode: PRVC  FiO2 (%):  [100 %] 100 % Set Rate:  [18 bmp] 18 bmp Vt Set:  [420 mL-560 mL] 420 mL PEEP:  [5 cmH20] 5 cmH20 Plateau Pressure:  [24 cmH20-26 cmH20] 24 cmH20  No intake or output data in the 24 hours ending 02/09/19 2149 Filed Weights   02/09/19 1800  Weight: 99.8 kg    Examination: General: obese male on vent HENT: [place]/AT, PERRL, unable to appreciate JVD Lungs: Coarse Cardiovascular: RRR, no MRG Abdomen: Soft, non-distended Extremities: NO acute deformity or ROM limitation Neuro: [facility] Problem list     Assessment & Plan:   Acute hypoxemic respiratory failure: likely in the setting of volume overload and pulmonary edema.  - Full vent support - Follow ABG, CXR - Give lasix - Daily SBT - Fentanyl infusion with PRN versed. Hold home suboxone   Acute on chronic HFpEF QTC prolongation (493 on admit) - Diurese as above - Echo ordered - Avoid QT prolonging agents - Holding home carvedilol due to borderline hypotension  Atrial fibrillation - heparin per pharmacy  - continue home amiodarone - holding home warfarin  COPD without acute exacerbation - Duonebs, budesonide nebs - holding home fluticasone, proair   Best practice:  Diet: NPO Pain/Anxiety/Delirium protocol (if indicated): as above VAP protocol (if indicated): Yes DVT prophylaxis: heparin infusion GI prophylaxis: PPI Glucose control: na Mobility: BR Code Status: FULL Family Communication: Colin Mulders updated Disposition: ICU  Labs   CBC: Recent Labs  Lab 02/09/19 1740 02/09/19 1756 02/09/19 2029  WBC 8.1  --   --   NEUTROABS 5.5  --   --   HGB 15.9 16.3 15.6  HCT 47.1 48.0 46.0  MCV 123.3*  --   --  PLT 188  --   --     Basic Metabolic Panel: Recent Labs  Lab 02/09/19 1740 02/09/19 1756 02/09/19 2029  NA 138 142 141  K 3.9 3.5 3.5  CL 102  --   --   CO2 21*  --   --   GLUCOSE 217*  --   --   BUN 25*  --   --   CREATININE 1.89*  --   --   CALCIUM 7.7*  --    --    GFR: Estimated Creatinine Clearance: 41.7 mL/min (A) (by C-G formula based on SCr of 1.89 mg/dL (H)). Recent Labs  Lab 02/09/19 1740  WBC 8.1    Liver Function Tests: Recent Labs  Lab 02/09/19 1740  AST 38  ALT 16  ALKPHOS 101  BILITOT 0.7  PROT 6.6  ALBUMIN 3.3*   No results for input(s): LIPASE, AMYLASE in the last 168 hours. No results for input(s): AMMONIA in the last 168 hours.  ABG    Component Value Date/Time   PHART 7.291 (L) 02/09/2019 2029   PCO2ART 62.7 (H) 02/09/2019 2029   PO2ART 59.0 (L) 02/09/2019 2029   HCO3 30.2 (H) 02/09/2019 2029   TCO2 32 02/09/2019 2029   ACIDBASEDEF 2.0 02/09/2019 1756   O2SAT 86.0 02/09/2019 2029     Coagulation Profile: No results for input(s): INR, PROTIME in the last 168 hours.  Cardiac Enzymes: No results for input(s): CKTOTAL, CKMB, CKMBINDEX, TROPONINI in the last 168 hours.  HbA1C: Hgb A1c MFr Bld  Date/Time Value Ref Range Status  04/13/2018 04:56 AM 5.4 4.8 - 5.6 % Final    Comment:    (NOTE) Pre diabetes:          5.7%-6.4% Diabetes:              >6.4% Glycemic control for   <7.0% adults with diabetes   11/01/2014 07:11 AM 5.5 4.8 - 5.6 % Final    Comment:    (NOTE)         Pre-diabetes: 5.7 - 6.4         Diabetes: >6.4         Glycemic control for adults with diabetes: <7.0     CBG: No results for input(s): GLUCAP in the last 168 hours.  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   Surgical  History    Past Surgical History:  Procedure Laterality Date  . CARDIAC ELECTROPHYSIOLOGY MAPPING AND ABLATION  08/2010   [name] 09/07/2010 (12/14/2012)  . COLONOSCOPY     15-20 years ago had colon in [place]  . EXCISIONAL HEMORRHOIDECTOMY  1970's  . LOOP RECORDER IMPLANT N/A 08/23/2013   Procedure: LOOP RECORDER IMPLANT;  Surgeon: Deboraha Sprang, MD;  Location: [facility] CATH LAB;  Service: Cardiovascular;  Laterality: N/A;  . MULTIPLE EXTRACTIONS WITH ALVEOLOPLASTY Bilateral 01/24/2016   Procedure: MULTIPLE EXTRACTION WITH ALVEOLOPLASTY BILATERAL;  Surgeon: Diona Browner, DDS;  Location: [facility];  Service: Oral Surgery;  Laterality: Bilateral;  . MULTIPLE TOOTH EXTRACTIONS  01/24/2016   MULTIPLE EXTRACTION WITH ALVEOLOPLASTY BILATERAL (Bilateral)     Social History   reports that  he has been smoking cigarettes. He has a 5.00 pack-year smoking history. He has never used smokeless tobacco. He reports current alcohol use of about 89.0 standard drinks of alcohol per week. He reports previous drug use. Drugs: "Crack" cocaine, Cocaine, and Heroin.   Family History   His family history includes Alcohol abuse in his father; Cirrhosis in his father; Diabetes in his mother; Hypertension in his mother. There is no history of Colon cancer, Rectal cancer, or Stomach cancer.   Allergies No Known Allergies   Home Medications  Prior to Admission medications   Medication Sig Start Date End Date Taking? Authorizing Provider  albuterol (PROAIR HFA) 108 (90 Base) MCG/ACT inhaler Inhale 1-2 puffs into the lungs every 6 (six) hours as needed for wheezing or shortness of breath. 01/11/19   [name], MD  amiodarone (PACERONE) 200 MG tablet Take 0.5 tablets (100 mg total) by mouth daily. 06/20/18   Chanetta Marshall K, NP  carvedilol (COREG) 3.125 MG tablet Take 1 tablet (3.125 mg total) by mouth daily. 01/12/19   [name], MD  fluticasone (FLOVENT HFA) 44 MCG/ACT inhaler Inhale 2 puffs into the lungs 2 (two) times daily as  needed (sob).     [provider]  Fluticasone-Umeclidin-Vilant (TRELEGY ELLIPTA) 100-62.5-25 MCG/INH AEPB Inhale 1 puff into the lungs daily. 01/11/19   [name], MD  folic acid (FOLVITE) 1 MG tablet Take 1 tablet (1 mg total) by mouth daily. 09/02/18   [name], MD  furosemide (LASIX) 20 MG tablet Take 1 tablet (20 mg total) by mouth daily. 03/17/18 02/09/19  Patsey Berthold, NP  hydroxyurea (HYDREA) 500 MG capsule Take 2 capsules (1,000 mg total) by mouth daily. MAY TAKE WITH FOOD TO MINIMIZE GI SIDE EFFECTS 05/20/18   [name], MD  ipratropium-albuterol (DUONEB) 0.5-2.5 (3) MG/3ML SOLN TAKE 3 MLS BY MOUTH EVERY 4 HOURS AS NEEDED FOR SHORTNESS OF BREATH Patient taking differently: Inhale 3 mLs into the lungs every 6 (six) hours as needed (for shortness of breath or wheezing).  04/15/18   [name], MD  SUBOXONE 8-2 MG FILM Place 1 Film under the tongue daily.  08/21/15   [provider]  warfarin (COUMADIN) 2.5 MG tablet TAKE 1/2 TABLET BY MOUTH ON TUESDAY AND FRIDAY. TAKE 1 TABLET DAILY ON THE OTHER DAYS 01/30/19   Aldine Contes, MD     Critical care time: 90 mins     Georgann Housekeeper, AGACNP-BC [place] Pager [phone] or [phone]  02/09/2019 10:10 PM

## 2019-02-09 NOTE — Progress Notes (Signed)
Pt came in via EMS having respiratory distress. Pt was intubated at 1725 with a 8.0 size tube. Pt seems to be tolerating well at this time. Will continue to monitor.

## 2019-02-09 NOTE — ED Triage Notes (Signed)
Pt here for resp distress. Was with wife at [place] when he had sudden onset of sob, did not choke on anything. O2 sats in 60s on EMS arrival, no improvement with meds. Pt being bagged on arrival to ED.

## 2019-02-09 NOTE — ED Notes (Addendum)
Stacie Glaze (sister)  [phone] please call when updates available

## 2019-02-10 ENCOUNTER — Inpatient Hospital Stay (HOSPITAL_COMMUNITY): Payer: Medicare Other

## 2019-02-10 ENCOUNTER — Encounter (HOSPITAL_COMMUNITY): Payer: Self-pay

## 2019-02-10 DIAGNOSIS — I340 Nonrheumatic mitral (valve) insufficiency: Secondary | ICD-10-CM

## 2019-02-10 DIAGNOSIS — I361 Nonrheumatic tricuspid (valve) insufficiency: Secondary | ICD-10-CM

## 2019-02-10 LAB — ECHOCARDIOGRAM COMPLETE
Height: 69 in
Weight: 3671.98 oz

## 2019-02-10 LAB — PHOSPHORUS
Phosphorus: 3.6 mg/dL (ref 2.5–4.6)
Phosphorus: 2.2 mg/dL — ABNORMAL LOW (ref 2.5–4.6)

## 2019-02-10 LAB — HEMOGLOBIN A1C
Hgb A1c MFr Bld: 5.8 % — ABNORMAL HIGH (ref 4.8–5.6)
Mean Plasma Glucose: 119.76 mg/dL

## 2019-02-10 LAB — POCT I-STAT 7, (LYTES, BLD GAS, ICA,H+H)
Acid-Base Excess: 1 mmol/L (ref 0.0–2.0)
Bicarbonate: 25.2 mmol/L (ref 20.0–28.0)
Calcium, Ion: 1.1 mmol/L — ABNORMAL LOW (ref 1.15–1.40)
HCT: 43 % (ref 39.0–52.0)
Hemoglobin: 14.6 g/dL (ref 13.0–17.0)
O2 Saturation: 96 %
Patient temperature: 36.9
Potassium: 3.7 mmol/L (ref 3.5–5.1)
Sodium: 142 mmol/L (ref 135–145)
TCO2: 26 mmol/L (ref 22–32)
pCO2 arterial: 36.9 mmHg (ref 32.0–48.0)
pH, Arterial: 7.442 (ref 7.350–7.450)
pO2, Arterial: 81 mmHg — ABNORMAL LOW (ref 83.0–108.0)

## 2019-02-10 LAB — GLUCOSE, CAPILLARY
Glucose-Capillary: 149 mg/dL — ABNORMAL HIGH (ref 70–99)
Glucose-Capillary: 152 mg/dL — ABNORMAL HIGH (ref 70–99)
Glucose-Capillary: 180 mg/dL — ABNORMAL HIGH (ref 70–99)

## 2019-02-10 LAB — CBC
HCT: 42.8 % (ref 39.0–52.0)
Hemoglobin: 14.7 g/dL (ref 13.0–17.0)
MCH: 41.4 pg — ABNORMAL HIGH (ref 26.0–34.0)
MCHC: 34.3 g/dL (ref 30.0–36.0)
MCV: 120.6 fL — ABNORMAL HIGH (ref 80.0–100.0)
Platelets: 122 10*3/uL — ABNORMAL LOW (ref 150–400)
RBC: 3.55 MIL/uL — ABNORMAL LOW (ref 4.22–5.81)
RDW: 13.3 % (ref 11.5–15.5)
WBC: 6.9 10*3/uL (ref 4.0–10.5)
nRBC: 0 % (ref 0.0–0.2)

## 2019-02-10 LAB — BASIC METABOLIC PANEL
Anion gap: 12 (ref 5–15)
BUN: 26 mg/dL — ABNORMAL HIGH (ref 8–23)
CO2: 25 mmol/L (ref 22–32)
Calcium: 7.7 mg/dL — ABNORMAL LOW (ref 8.9–10.3)
Chloride: 104 mmol/L (ref 98–111)
Creatinine, Ser: 1.86 mg/dL — ABNORMAL HIGH (ref 0.61–1.24)
GFR calc Af Amer: 41 mL/min — ABNORMAL LOW (ref >60)
GFR calc non Af Amer: 36 mL/min — ABNORMAL LOW (ref >60)
Glucose, Bld: 139 mg/dL — ABNORMAL HIGH (ref 70–99)
Potassium: 3.2 mmol/L — ABNORMAL LOW (ref 3.5–5.1)
Sodium: 141 mmol/L (ref 135–145)

## 2019-02-10 LAB — PROTIME-INR
INR: 2.4 — ABNORMAL HIGH (ref 0.8–1.2)
Prothrombin Time: 26.2 seconds — ABNORMAL HIGH (ref 11.4–15.2)

## 2019-02-10 LAB — MAGNESIUM
Magnesium: 1.6 mg/dL — ABNORMAL LOW (ref 1.7–2.4)
Magnesium: 2.1 mg/dL (ref 1.7–2.4)

## 2019-02-10 LAB — TRIGLYCERIDES: Triglycerides: 144 mg/dL (ref <150)

## 2019-02-10 LAB — PROCALCITONIN: Procalcitonin: 1.3 ng/mL

## 2019-02-10 LAB — MRSA PCR SCREENING: MRSA by PCR: NEGATIVE

## 2019-02-10 MED ORDER — CHLORHEXIDINE GLUCONATE CLOTH 2 % EX PADS
6.0000 | MEDICATED_PAD | Freq: Every day | CUTANEOUS | Status: DC
  Administered 2019-02-10: 6 via TOPICAL

## 2019-02-10 MED ORDER — WARFARIN SODIUM 2.5 MG PO TABS
2.5000 mg | ORAL_TABLET | ORAL | Status: AC
  Administered 2019-02-10: 2.5 mg via ORAL
  Filled 2019-02-10 (×2): qty 1

## 2019-02-10 MED ORDER — ORAL CARE MOUTH RINSE
15.0000 mL | OROMUCOSAL | Status: DC
  Administered 2019-02-10 (×2): 15 mL via OROMUCOSAL

## 2019-02-10 MED ORDER — FUROSEMIDE 20 MG PO TABS
20.0000 mg | ORAL_TABLET | Freq: Every day | ORAL | Status: DC
  Administered 2019-02-11 – 2019-02-13 (×3): 20 mg via ORAL
  Filled 2019-02-10 (×3): qty 1

## 2019-02-10 MED ORDER — FLUTICASONE FUROATE-VILANTEROL 200-25 MCG/INH IN AEPB
1.0000 | INHALATION_SPRAY | Freq: Every day | RESPIRATORY_TRACT | Status: DC
  Administered 2019-02-10 – 2019-02-13 (×4): 1 via RESPIRATORY_TRACT
  Filled 2019-02-10: qty 28

## 2019-02-10 MED ORDER — UMECLIDINIUM BROMIDE 62.5 MCG/INH IN AEPB
1.0000 | INHALATION_SPRAY | Freq: Every day | RESPIRATORY_TRACT | Status: DC
  Administered 2019-02-10 – 2019-02-13 (×4): 1 via RESPIRATORY_TRACT
  Filled 2019-02-10: qty 7

## 2019-02-10 MED ORDER — CHLORHEXIDINE GLUCONATE 0.12% ORAL RINSE (MEDLINE KIT)
15.0000 mL | Freq: Two times a day (BID) | OROMUCOSAL | Status: DC
  Administered 2019-02-10: 15 mL via OROMUCOSAL

## 2019-02-10 MED ORDER — PRO-STAT SUGAR FREE PO LIQD
30.0000 mL | Freq: Two times a day (BID) | ORAL | Status: DC
  Administered 2019-02-10 – 2019-02-13 (×6): 30 mL
  Filled 2019-02-10 (×6): qty 30

## 2019-02-10 MED ORDER — CARVEDILOL 3.125 MG PO TABS
3.1250 mg | ORAL_TABLET | Freq: Two times a day (BID) | ORAL | Status: DC
  Administered 2019-02-10 – 2019-02-13 (×6): 3.125 mg via ORAL
  Filled 2019-02-10 (×7): qty 1

## 2019-02-10 MED ORDER — VITAL HIGH PROTEIN PO LIQD
1000.0000 mL | ORAL | Status: DC
  Administered 2019-02-10: 1000 mL

## 2019-02-10 MED ORDER — WARFARIN 1.25 MG HALF TABLET: 1.2500 mg | ORAL_TABLET | ORAL | Status: DC

## 2019-02-10 MED ORDER — POTASSIUM CHLORIDE 20 MEQ/15ML (10%) PO SOLN
30.0000 meq | ORAL | Status: AC
  Administered 2019-02-10 (×2): 30 meq
  Filled 2019-02-10 (×2): qty 30

## 2019-02-10 MED ORDER — WARFARIN - PHARMACIST DOSING INPATIENT: Freq: Every day | Status: DC

## 2019-02-10 MED ORDER — MAGNESIUM SULFATE 2 GM/50ML IV SOLN
2.0000 g | Freq: Once | INTRAVENOUS | Status: AC
  Administered 2019-02-10: 2 g via INTRAVENOUS
  Filled 2019-02-10: qty 50

## 2019-02-10 MED ORDER — FUROSEMIDE 10 MG/ML IJ SOLN: 40.0000 mg | Freq: Once | INTRAMUSCULAR | Status: DC

## 2019-02-10 MED ORDER — FUROSEMIDE 10 MG/ML IJ SOLN
40.0000 mg | Freq: Once | INTRAMUSCULAR | Status: AC
  Administered 2019-02-10: 40 mg via INTRAVENOUS
  Filled 2019-02-10: qty 4

## 2019-02-10 MED ORDER — FOLIC ACID 1 MG PO TABS
1.0000 mg | ORAL_TABLET | Freq: Every day | ORAL | Status: DC
  Administered 2019-02-10 – 2019-02-13 (×4): 1 mg via ORAL
  Filled 2019-02-10 (×4): qty 1

## 2019-02-10 MED ORDER — CARVEDILOL 3.125 MG PO TABS: 3.1250 mg | ORAL_TABLET | Freq: Every day | ORAL | Status: DC

## 2019-02-10 NOTE — Discharge Instructions (Addendum)
Acute Respiratory Failure, Adult  Acute respiratory failure occurs when there is not enough oxygen passing from your lungs to your body. When this happens, your lungs have trouble removing carbon dioxide from the blood. This causes your blood oxygen level to drop too low as carbon dioxide builds up. Acute respiratory failure is a medical emergency. It can develop quickly, but it is temporary if treated promptly. Your lung capacity, or how much air your lungs can hold, may improve with time, exercise, and treatment. What are the causes? There are many possible causes of acute respiratory failure, including:  Lung injury.  Chest injury or damage to the ribs or tissues near the lungs.  Lung conditions that affect the flow of air and blood into and out of the lungs, such as pneumonia, acute respiratory distress syndrome, and cystic fibrosis.  Medical conditions, such as strokes or spinal cord injuries, that affect the muscles and nerves that control breathing.  Blood infection (sepsis).  Inflammation of the pancreas (pancreatitis).  A blood clot in the lungs (pulmonary embolism).  A large-volume blood transfusion.  Burns.  Near-drowning.  Seizure.  Smoke inhalation.  Reaction to medicines.  Alcohol or drug overdose. What increases the risk? This condition is more likely to develop in people who have:  A blocked airway.  Asthma.  A condition or disease that damages or weakens the muscles, nerves, bones, or tissues that are involved in breathing.  A serious infection.  A health problem that blocks the unconscious reflex that is involved in breathing, such as hypothyroidism or sleep apnea.  A lung injury or trauma. What are the signs or symptoms? Trouble breathing is the main symptom of acute respiratory failure. Symptoms may also include:  Rapid breathing.  Restlessness or anxiety.  Skin, lips, or fingernails that appear blue (cyanosis).  Rapid heart  rate.  Abnormal heart rhythms (arrhythmias).  Confusion or changes in behavior.  Tiredness or loss of energy.  Feeling sleepy or having a loss of consciousness. How is this diagnosed? Your health care provider can diagnose acute respiratory failure with a medical history and physical exam. During the exam, your health care provider will listen to your heart and check for crackling or wheezing sounds in your lungs. Your may also have tests to confirm the diagnosis and determine what is causing respiratory failure. These tests may include:  Measuring the amount of oxygen in your blood (pulse oximetry). The measurement comes from a small device that is placed on your finger, earlobe, or toe.  Other blood tests to measure blood gases and to look for signs of infection.  Sampling your cerebral spinal fluid or tracheal fluid to check for infections.  Chest X-ray to look for fluid in spaces that should be filled with air.  Electrocardiogram (ECG) to look at the heart's electrical activity. How is this treated? Treatment for this condition usually takes places in a hospital intensive care unit (ICU). Treatment depends on what is causing the condition. It may include one or more treatments until your symptoms improve. Treatment may include:  Supplemental oxygen. Extra oxygen is given through a tube in the nose, a face mask, or a hood.  A device such as a continuous positive airway pressure (CPAP) or bi-level positive airway pressure (BiPAP or BPAP) machine. This treatment uses mild air pressure to keep the airways open. A mask or other device will be placed over your nose or mouth. A tube that is connected to a motor will deliver oxygen through  the mask.  Ventilator. This treatment helps move air into and out of the lungs. This may be done with a bag and mask or a machine. For this treatment, a tube is placed in your windpipe (trachea) so air and oxygen can flow to the lungs.  Extracorporeal  membrane oxygenation (ECMO). This treatment temporarily takes over the function of the heart and lungs, supplying oxygen and removing carbon dioxide. ECMO gives the lungs a chance to recover. It may be used if a ventilator is not effective.  Tracheostomy. This is a procedure that creates a hole in the neck to insert a breathing tube.  Receiving fluids and medicines.  Rocking the bed to help breathing. Follow these instructions at home:  Take over-the-counter and prescription medicines only as told by your health care provider.  Return to normal activities as told by your health care provider. Ask your health care provider what activities are safe for you.  Keep all follow-up visits as told by your health care provider. This is important. How is this prevented? Treating infections and medical conditions that may lead to acute respiratory failure can help prevent the condition from developing. Contact a health care provider if:  You have a fever.  Your symptoms do not improve or they get worse. Get help right away if:  You are having trouble breathing.  You lose consciousness.  Your have cyanosis or turn blue.  You develop a rapid heart rate.  You are confused. These symptoms may represent a serious problem that is an emergency. Do not wait to see if the symptoms will go away. Get medical help right away. Call your local emergency services (911 in the [place] Do not drive yourself to the hospital. This information is not intended to replace advice given to you by your health care provider. Make sure you discuss any questions you have with your health care provider. Document Released: 07/11/2013 Document Revised: 06/18/2017 Document Reviewed: 01/22/2016 Elsevier Patient Education  [place].   Heart Failure, Diagnosis  Heart failure means that your heart is not able to pump blood in the right way. This makes it hard for your body to work well. Heart failure is usually a  long-term (chronic) condition. You must take good care of yourself and follow your treatment plan from your doctor. What are the causes? This condition may be caused by:  High blood pressure.  Build up of cholesterol and fat in the arteries.  Heart attack. This injures the heart muscle.  Heart valves that do not open and close properly.  Damage of the heart muscle. This is also called cardiomyopathy.  Lung disease.  Abnormal heart rhythms. What increases the risk? The risk of heart failure goes up as a person ages. This condition is also more likely to develop in people who:  Are overweight.  Are male.  Smoke or chew tobacco.  Abuse alcohol or illegal drugs.  Have taken medicines that can damage the heart.  Have diabetes.  Have abnormal heart rhythms.  Have thyroid problems.  Have low blood counts (anemia). What are the signs or symptoms? Symptoms of this condition include:  Shortness of breath.  Coughing.  Swelling of the feet, ankles, legs, or belly.  Losing weight for no reason.  Trouble breathing.  Waking from sleep because of the need to sit up and get more air.  Rapid heartbeat.  Being very tired.  Feeling dizzy, or feeling like you may pass out (faint).  Having no desire to  eat.  Feeling like you may vomit (nauseous).  Peeing (urinating) more at night.  Feeling confused. How is this treated?     This condition may be treated with:  Medicines. These can be given to treat blood pressure and to make the heart muscles stronger.  Changes in your daily life. These may include eating a healthy diet, staying at a healthy body weight, quitting tobacco and illegal drug use, or doing exercises.  Surgery. Surgery can be done to open blocked valves, or to put devices in the heart, such as pacemakers.  A donor heart (heart transplant). You will receive a healthy heart from a donor. Follow these instructions at home:  Treat other conditions as  told by your doctor. These may include high blood pressure, diabetes, thyroid disease, or abnormal heart rhythms.  Learn as much as you can about heart failure.  Get support as you need it.  Keep all follow-up visits as told by your doctor. This is important. Summary  Heart failure means that your heart is not able to pump blood in the right way.  This condition is caused by high blood pressure, heart attack, or damage of the heart muscle.  Symptoms of this condition include shortness of breath and swelling of the feet, ankles, legs, or belly. You may also feel very tired or feel like you may vomit.  You may be treated with medicines, surgery, or changes in your daily life.  Treat other health conditions as told by your doctor. This information is not intended to replace advice given to you by your health care provider. Make sure you discuss any questions you have with your health care provider. Document Released: 04/14/2008 Document Revised: 09/23/2018 Document Reviewed: 09/23/2018 Elsevier Patient Education  [place].   Heart Failure Eating Plan Heart failure, also called congestive heart failure, occurs when your heart does not pump blood well enough to meet your body's needs for oxygen-rich blood. Heart failure is a long-term (chronic) condition. Living with heart failure can be challenging. However, following your health care provider's instructions about a healthy lifestyle and working with a diet and nutrition specialist (dietitian) to choose the right foods may help to improve your symptoms. What are tips for following this plan? Reading food labels  Check food labels for the amount of sodium per serving. Choose foods that have less than 140 mg (milligrams) of sodium in each serving.  Check food labels for the number of calories per serving. This is important if you need to limit your daily calorie intake to lose weight.  Check food labels for the serving size. If  you eat more than one serving, you will be eating more sodium and calories than what is listed on the label.  Look for foods that are labeled as "sodium-free," "very low sodium," or "low sodium." ? Foods labeled as "reduced sodium" or "lightly salted" may still have more sodium than what is recommended for you. Cooking  Avoid adding salt when cooking. Ask your health care provider or dietitian before using salt substitutes.  Season food with salt-free seasonings, spices, or herbs. Check the label of seasoning mixes to make sure they do not contain salt.  Cook with heart-healthy oils, such as olive, canola, soybean, or sunflower oil.  Do not fry foods. Cook foods using low-fat methods, such as baking, boiling, grilling, and broiling.  Limit unhealthy fats when cooking by: ? Removing the skin from poultry, such as chicken. ? Removing all visible fats from meats. ?  Skimming the fat off from stews, soups, and gravies before serving them. Meal planning   Limit your intake of: ? Processed, canned, or pre-packaged foods. ? Foods that are high in trans fat, such as fried foods. ? Sweets, desserts, sugary drinks, and other foods with added sugar. ? Full-fat dairy products, such as whole milk.  Eat a balanced diet that includes: ? 4-5 servings of fruit each day and 4-5 servings of vegetables each day. At each meal, try to fill half of your plate with fruits and vegetables. ? Up to 6-8 servings of whole grains each day. ? Up to 2 servings of lean meat, poultry, or fish each day. One serving of meat is equal to 3 oz. This is about the same size as a deck of cards. ? 2 servings of low-fat dairy each day. ? Heart-healthy fats. Healthy fats called omega-3 fatty acids are found in foods such as flaxseed and cold-water fish like sardines, salmon, and mackerel.  Aim to eat 25-35 g (grams) of fiber a day. Foods that are high in fiber include apples, broccoli, carrots, beans, peas, and whole  grains.  Do not add salt or condiments that contain salt (such as soy sauce) to foods before eating.  When eating at a restaurant, ask that your food be prepared with less salt or no salt, if possible.  Try to eat 2 or more vegetarian meals each week.  Eat more home-cooked food and eat less restaurant, buffet, and fast food. General information  Do not eat more than 2,300 mg of salt (sodium) a day. The amount of sodium that is recommended for you may be lower, depending on your condition.  Maintain a healthy body weight as directed. Ask your health care provider what a healthy weight is for you. ? Check your weight every day. ? Work with your health care provider and dietitian to make a plan that is right for you to lose weight or maintain your current weight.  Limit how much fluid you drink. Ask your health care provider or dietitian how much fluid you can have each day.  Limit or avoid alcohol as told by your health care provider or dietitian. Recommended foods The items listed may not be a complete list. Talk with your dietitian about what dietary choices are best for you. Fruits All fresh, frozen, and canned fruits. Dried fruits, such as raisins, prunes, and cranberries. Vegetables All fresh vegetables. Vegetables that are frozen without sauce or added salt. Low-sodium or sodium-free canned vegetables. Grains Bread with less than 80 mg of sodium per slice. Whole-wheat pasta, quinoa, and brown rice. Oats and oatmeal. Barley. [place]. Grits and cream of wheat. Whole-grain and whole-wheat cold cereal. Meats and other protein foods Lean cuts of meat. Skinless chicken and [place]. Fish with high omega-3 fatty acids, such as salmon, sardines, and other cold-water fishes. Eggs. Dried beans, peas, and edamame. Unsalted nuts and nut butters. Dairy Low-fat or nonfat (skim) milk and dried milk. Rice milk, soy milk, and almond milk. Low-fat or nonfat yogurt. Small amounts of reduced-sodium block  cheese. Low-sodium cottage cheese. Fats and oils Olive, canola, soybean, flaxseed, or sunflower oil. Avocado. Sweets and desserts Apple sauce. Granola bars. Sugar-free pudding and gelatin. Frozen fruit bars. Seasoning and other foods Fresh and dried herbs. Lemon or lime juice. Vinegar. Low-sodium ketchup. Salt-free marinades, salad dressings, sauces, and seasonings. The items listed above may not be a complete list of foods and beverages you can eat. Contact a dietitian for more  information. Foods to avoid The items listed may not be a complete list. Talk with your dietitian about what dietary choices are best for you. Fruits Fruits that are dried with sodium-containing preservatives. Vegetables Canned vegetables. Frozen vegetables with sauce or seasonings. Creamed vegetables. [place] fries. Onion rings. Pickled vegetables and sauerkraut. Grains Bread with more than 80 mg of sodium per slice. Hot or cold cereal with more than 140 mg sodium per serving. Salted pretzels and crackers. Pre-packaged breadcrumbs. Bagels, croissants, and biscuits. Meats and other protein foods Ribs and chicken wings. Bacon, ham, pepperoni, bologna, salami, and packaged luncheon meats. Hot dogs, bratwurst, and sausage. Canned meat. Smoked meat and fish. Salted nuts and seeds. Dairy Whole milk, half-and-half, and cream. Buttermilk. Processed cheese, cheese spreads, and cheese curds. Regular cottage cheese. Feta cheese. Shredded cheese. String cheese. Fats and oils Butter, lard, shortening, ghee, and bacon fat. Canned and packaged gravies. Seasoning and other foods Onion salt, garlic salt, table salt, and sea salt. Marinades. Regular salad dressings. Relishes, pickles, and olives. Meat flavorings and tenderizers, and bouillon cubes. Horseradish, ketchup, and mustard. Worcestershire sauce. Teriyaki sauce, soy sauce (including reduced sodium). Hot sauce and Tabasco sauce. Steak sauce, fish sauce, oyster sauce, and cocktail  sauce. Taco seasonings. Barbecue sauce. Tartar sauce. The items listed above may not be a complete list of foods and beverages you should avoid. Contact a dietitian for more information. Summary  A heart failure eating plan includes changes that limit your intake of sodium and unhealthy fat, and it may help you lose weight or maintain a healthy weight. Your health care provider may also recommend limiting how much fluid you drink.  Most people with heart failure should eat no more than 2,300 mg of salt (sodium) a day. The amount of sodium that is recommended for you may be lower, depending on your condition.  Contact your health care provider or dietitian before making any major changes to your diet. This information is not intended to replace advice given to you by your health care provider. Make sure you discuss any questions you have with your health care provider. Document Released: 11/20/2016 Document Revised: 09/01/2018 Document Reviewed: 11/20/2016 Elsevier Patient Education  2020 [place].   Heart Failure Exacerbation  Heart failure is a condition in which the heart does not fill up with enough blood, and therefore does not pump enough blood and oxygen to the body. When this happens, parts of the body do not get the blood and oxygen they need to function properly. This can cause symptoms such as breathing problems, fatigue, swelling, and confusion. Heart failure exacerbation refers to heart failure symptoms that get worse. The symptoms may get worse suddenly or develop slowly over time. Heart failure exacerbation is a serious medical problem that should be treated right away. What are the causes? A heart failure exacerbation can be triggered by:  Not taking your heart failure medicines correctly.  Infections.  Eating an unhealthy diet or a diet that is high in salt (sodium).  Drinking too much fluid.  Drinking alcohol.  Taking illegal drugs, such as cocaine or  methamphetamine.  Not exercising. Other causes include:  Other heart conditions such as an irregular heartbeat (arrhythmia).  Anemia.  Other medical problems, such as kidney failure. Sometimes the cause of the exacerbation is not known. What are the signs or symptoms? When heart failure symptoms suddenly or slowly get worse, this may be a sign of heart failure exacerbation. Symptoms of heart failure include:  Breathing problems  or shortness of breath.  Chronic coughing or wheezing.  Fatigue.  Nausea or lack of appetite.  Feeling light-headed.  Confusion or memory loss.  Increased heart rate or irregular heartbeat.  Buildup of fluid in the legs, ankles, feet, or abdomen.  Difficulty breathing when lying down. How is this diagnosed? This condition is diagnosed based on:  Your symptoms and medical history.  A physical exam. You may also have tests, including:  Electrocardiogram (ECG). This test measures the electrical activity of your heart.  Echocardiogram. This test uses sound waves to take a picture of your heart to see how well it works.  Blood tests.  Imaging tests, such as: ? Chest X-ray. ? MRI. ? Ultrasound.  Stress test. This test examines how well your heart functions when you exercise. Your heart is monitored while you exercise on a treadmill or exercise bike. If you cannot exercise, medicines may be used to increase your heartbeat in place of exercise.  Cardiac catheterization. During this test, a thin, flexible tube (catheter) is inserted into a blood vessel and threaded up to your heart. This test allows your health care provider to check the arteries that lead to your heart (coronary arteries).  Right heart catheterization. During this test, the pressure in your heart is measured. How is this treated? This condition may be treated by:  Adjusting your heart medicines.  Maintaining a healthy lifestyle. This includes: ? Eating a heart-healthy diet  that is low in sodium. ? Not using any products that contain nicotine or tobacco, such as cigarettes and e-cigarettes. ? Regular exercise. ? Monitoring your fluid intake. ? Monitoring your weight and reporting changes to your health care provider.  Treating sleep apnea, if you have this condition.  Surgery. This may include: ? Implanting a device that helps both sides of your heart contract at the same time (cardiac resynchronization therapy device). This can help with heart function and relieve heart failure symptoms. ? Implanting a device that can correct heart rhythm problems (implantable cardioverter defibrillator). ? Connecting a device to your heart to help it pump blood (ventricular assist device). ? Heart transplant. Follow these instructions at home: Medicines  Take over-the-counter and prescription medicines only as told by your health care provider.  Do not stop taking your medicines or change the amount you take. If you are having problems or side effects from your medicines, talk to your health care provider.  If you are having difficulty paying for your medicines, contact a social worker or your clinic. There are many programs to assist with medicine costs.  Talk to your health care provider before starting any new medicines or supplements.  Make sure your health care provider and pharmacist have a list of all the medicines you are taking. Eating and drinking   Avoid drinking alcohol.  Eat a heart-healthy diet as told by your health care provider. This includes: ? Plenty of fruits and vegetables. ? Lean proteins. ? Low-fat dairy. ? Whole grains. ? Foods that are low in sodium. Activity   Exercise regularly as told by your health care provider. Balance exercise with rest.  Ask your health care provider what activities are safe for you. This includes sexual activity, exercise, and daily tasks at home or work. Lifestyle  Do not use any products that contain  nicotine or tobacco, such as cigarettes and e-cigarettes. If you need help quitting, ask your health care provider.  Maintain a healthy weight. Ask your health care provider what weight is healthy for  you.  Consider joining a patient support group. This can help with emotional problems you may have, such as stress and anxiety. General instructions  Talk to your health care provider about flu and pneumonia vaccines.  Keep a list of medicines that you are taking. This may help in emergency situations.  Keep all follow-up visits as told by your health care provider. This is important. Contact a health care provider if:  You have questions about your medicines or you miss a dose.  You feel anxious, depressed, or stressed.  You have swelling in your feet, ankles, legs, or abdomen.  You have shortness of breath during activity or exercise.  You have a cough.  You have a fever.  You have trouble sleeping.  You gain 2-3 lb (1-1.4 kg) in 24 hours or 5 lb (2.3 kg) in a week. Get help right away if:  You have chest pain.  You have shortness of breath while resting.  You have severe fatigue.  You are confused.  You have severe dizziness.  You have a rapid or irregular heartbeat.  You have nausea or you vomit.  You have a cough that is worse at night or you cannot lie flat.  You have a cough that will not go away.  You have severe depression or sadness. Summary  When heart failure symptoms get worse, it is called heart failure exacerbation.  Common causes of this condition include taking medicines incorrectly, infections, and drinking alcohol.  This condition may be treated by adjusting medicines, maintaining a healthy lifestyle, or surgery.  Do not stop taking your medicines or change the amount you take. If you are having problems or side effects from your medicines, talk to your health care provider. This information is not intended to replace advice given to you by  your health care provider. Make sure you discuss any questions you have with your health care provider. Document Released: 11/17/2016 Document Revised: 06/18/2017 Document Reviewed: 11/17/2016 Elsevier Patient Education  2020 [place].   Heart Failure Action Plan A heart failure action plan helps you understand what to do when you have symptoms of heart failure. Follow the plan that was created by you and your health care provider. Review your plan each time you visit your health care provider. Red zone These signs and symptoms mean you should get medical help right away:  You have trouble breathing when resting.  You have a dry cough that is getting worse.  You have swelling or pain in your legs or abdomen that is getting worse.  You suddenly gain more than 2-3 lb (0.9-1.4 kg) in a day, or more than 5 lb (2.3 kg) in one week. This amount may be more or less depending on your condition.  You have trouble staying awake or you feel confused.  You have chest pain.  You do not have an appetite.  You pass out. If you experience any of these symptoms:  Call your local emergency services (911 in the [place] right away or seek help at the emergency department of the nearest hospital. Yellow zone These signs and symptoms mean your condition may be getting worse and you should make some changes:  You have trouble breathing when you are active or you need to sleep with extra pillows.  You have swelling in your legs or abdomen.  You gain 2-3 lb (0.9-1.4 kg) in one day, or 5 lb (2.3 kg) in one week. This amount may be more or less depending on  your condition.  You get tired easily.  You have trouble sleeping.  You have a dry cough. If you experience any of these symptoms:  Contact your health care provider within the next day.  Your health care provider may adjust your medicines. Green zone These signs mean you are doing well and can continue what you are doing:  You do not  have shortness of breath.  You have very little swelling or no new swelling.  Your weight is stable (no gain or loss).  You have a normal activity level.  You do not have chest pain or any other new symptoms. Follow these instructions at home:  Take over-the-counter and prescription medicines only as told by your health care provider.  Weigh yourself daily. Your target weight is __________ lb (__________ kg). ? Call your health care provider if you gain more than __________ lb (__________ kg) in a day, or more than __________ lb (__________ kg) in one week.  Eat a heart-healthy diet. Work with a diet and nutrition specialist (dietitian) to create an eating plan that is best for you.  Keep all follow-up visits as told by your health care provider. This is important. Where to find more information  American Heart Association: www.heart.org Summary  Follow the action plan that was created by you and your health care provider.  Get help right away if you have any symptoms in the Red zone. This information is not intended to replace advice given to you by your health care provider. Make sure you discuss any questions you have with your health care provider. Document Released: 08/15/2016 Document Revised: 06/18/2017 Document Reviewed: 08/15/2016 Elsevier Patient Education  2020 [place].   Heart Failure, Self Care Heart failure is a serious condition. This sheet explains things you need to do to take care of yourself at home. To help you stay as healthy as possible, you may be asked to change your diet, take certain medicines, and make other changes in your life. Your doctor may also give you more specific instructions. If you have problems or questions, call your doctor. What are the risks? Having heart failure makes it more likely for you to have some problems. These problems can get worse if you do not take good care of yourself. Problems may include:  Blood clotting problems.  This may cause a stroke.  Damage to the kidneys, liver, or lungs.  Abnormal heart rhythms. Supplies needed:  Scale for weighing yourself.  Blood pressure monitor.  Notebook.  Medicines. How to care for yourself when you have heart failure Medicines Take over-the-counter and prescription medicines only as told by your doctor. Take your medicines every day.  Do not stop taking your medicine unless your doctor tells you to do so.  Do not skip any medicines.  Get your prescriptions refilled before you run out of medicine. This is important. Eating and drinking   Eat heart-healthy foods. Talk with a diet specialist (dietitian) to create an eating plan.  Choose foods that: ? Have no trans fat. ? Are low in saturated fat and cholesterol.  Choose healthy foods, such as: ? Fresh or frozen fruits and vegetables. ? Fish. ? Low-fat (lean) meats. ? Legumes, such as beans, peas, and lentils. ? Fat-free or low-fat dairy products. ? Whole-grain foods. ? High-fiber foods.  Limit salt (sodium) if told by your doctor. Ask your diet specialist to tell you which seasonings are healthy for your heart.  Cook in healthy ways instead of frying. Healthy  ways of cooking include roasting, grilling, broiling, baking, poaching, steaming, and stir-frying.  Limit how much fluid you drink, if told by your doctor. Alcohol use  Do not drink alcohol if: ? Your doctor tells you not to drink. ? Your heart was damaged by alcohol, or you have very bad heart failure. ? You are pregnant, may be pregnant, or are planning to become pregnant.  If you drink alcohol: ? Limit how much you use to:  0-1 drink a day for women.  0-2 drinks a day for men. ? Be aware of how much alcohol is in your drink. In the [place] one drink equals one 12 oz bottle of beer (355 mL), one 5 oz glass of wine (148 mL), or one 1 oz glass of hard liquor (44 mL). Lifestyle   Do not use any products that contain nicotine or  tobacco, such as cigarettes, e-cigarettes, and chewing tobacco. If you need help quitting, ask your doctor. ? Do not use nicotine gum or patches before talking to your doctor.  Do not use illegal drugs.  Lose weight if told by your doctor.  Do physical activity if told by your doctor. Talk to your doctor before you begin an exercise if: ? You are an older adult. ? You have very bad heart failure.  Learn to manage stress. If you need help, ask your doctor.  Get rehab (rehabilitation) to help you stay independent and to help with your quality of life.  Plan time to rest when you get tired. Check weight and blood pressure   Weigh yourself every day. This will help you to know if fluid is building up in your body. ? Weigh yourself every morning after you pee (urinate) and before you eat breakfast. ? Wear the same amount of clothing each time. ? Write down your daily weight. Give your record to your doctor.  Check and write down your blood pressure as told by your doctor.  Check your pulse as told by your doctor. Dealing with very hot and very cold weather  If it is very hot: ? Avoid activities that take a lot of energy. ? Use air conditioning or fans, or find a cooler place. ? Avoid caffeine and alcohol. ? Wear clothing that is loose-fitting, lightweight, and light-colored.  If it is very cold: ? Avoid activities that take a lot of energy. ? Layer your clothes. ? Wear mittens or gloves, a hat, and a scarf when you go outside. ? Avoid alcohol. Follow these instructions at home:  Stay up to date with shots (vaccines). Get pneumococcal and flu (influenza) shots.  Keep all follow-up visits as told by your doctor. This is important. Contact a doctor if:  You gain weight quickly.  You have increasing shortness of breath.  You cannot do your normal activities.  You get tired easily.  You cough a lot.  You don't feel like eating or feel like you may vomit  (nauseous).  You become puffy (swell) in your hands, feet, ankles, or belly (abdomen).  You cannot sleep well because it is hard to breathe.  You feel like your heart is beating fast (palpitations).  You get dizzy when you stand up. Get help right away if:  You have trouble breathing.  You or someone else notices a change in your behavior, such as having trouble staying awake.  You have chest pain or discomfort.  You pass out (faint). These symptoms may be an emergency. Do not wait to see if  the symptoms will go away. Get medical help right away. Call your local emergency services (911 in the [place] Do not drive yourself to the hospital. Summary  Heart failure is a serious condition. To care for yourself, you may have to change your diet, take medicines, and make other lifestyle changes.  Take your medicines every day. Do not stop taking them unless your doctor tells you to do so.  Eat heart-healthy foods, such as fresh or frozen fruits and vegetables, fish, lean meats, legumes, fat-free or low-fat dairy products, and whole-grain or high-fiber foods.  Ask your doctor if you can drink alcohol. You may have to stop alcohol use if you have very bad heart failure.  Contact your doctor if you gain weight quickly or feel that your heart is beating too fast. Get help right away if you pass out, or have chest pain or trouble breathing. This information is not intended to replace advice given to you by your health care provider. Make sure you discuss any questions you have with your health care provider. Document Released: 10/19/2018 Document Revised: 10/18/2018 Document Reviewed: 10/19/2018 Elsevier Patient Education  2020 [employer] on my medicine - Coumadin   (Warfarin)  This medication education was reviewed with me or my healthcare representative as part of my discharge preparation.  Why was Coumadin prescribed for you? Coumadin was prescribed for you because you  have a blood clot or a medical condition that can cause an increased risk of forming blood clots. Blood clots can cause serious health problems by blocking the flow of blood to the heart, lung, or brain. Coumadin can prevent harmful blood clots from forming. As a reminder your indication for Coumadin is:   Stroke Prevention Because Of Atrial Fibrillation  What test will check on my response to Coumadin? While on Coumadin (warfarin) you will need to have an INR test regularly to ensure that your dose is keeping you in the desired range. The INR (international normalized ratio) number is calculated from the result of the laboratory test called prothrombin time (PT).  If an INR APPOINTMENT HAS NOT ALREADY BEEN MADE FOR YOU please schedule an appointment to have this lab work done by your health care provider within 7 days. Your INR goal is usually a number between:  2 to 3 or your provider may give you a more narrow range like 2-2.5.  Ask your health care provider during an office visit what your goal INR is.  What  do you need to  know  About  COUMADIN? Take Coumadin (warfarin) exactly as prescribed by your healthcare provider about the same time each day.  DO NOT stop taking without talking to the doctor who prescribed the medication.  Stopping without other blood clot prevention medication to take the place of Coumadin may increase your risk of developing a new clot or stroke.  Get refills before you run out.  What do you do if you miss a dose? If you miss a dose, take it as soon as you remember on the same day then continue your regularly scheduled regimen the next day.  Do not take two doses of Coumadin at the same time.  Important Safety Information A possible side effect of Coumadin (Warfarin) is an increased risk of bleeding. You should call your healthcare provider right away if you experience any of the following: ? Bleeding from an injury or your nose that does not stop. ? Unusual colored  urine (red or dark brown) or  unusual colored stools (red or black). ? Unusual bruising for unknown reasons. ? A serious fall or if you hit your head (even if there is no bleeding).  Some foods or medicines interact with Coumadin (warfarin) and might alter your response to warfarin. To help avoid this: ? Eat a balanced diet, maintaining a consistent amount of Vitamin K. ? Notify your provider about major diet changes you plan to make. ? Avoid alcohol or limit your intake to 1 drink for women and 2 drinks for men per day. (1 drink is 5 oz. wine, 12 oz. beer, or 1.5 oz. liquor.)  Make sure that ANY health care provider who prescribes medication for you knows that you are taking Coumadin (warfarin).  Also make sure the healthcare provider who is monitoring your Coumadin knows when you have started a new medication including herbals and non-prescription products.  Coumadin (Warfarin)  Major Drug Interactions  Increased Warfarin Effect Decreased Warfarin Effect  Alcohol (large quantities) Antibiotics (esp. Septra/Bactrim, Flagyl, Cipro) Amiodarone (Cordarone) Aspirin (ASA) Cimetidine (Tagamet) Megestrol (Megace) NSAIDs (ibuprofen, naproxen, etc.) Piroxicam (Feldene) Propafenone (Rythmol SR) Propranolol (Inderal) Isoniazid (INH) Posaconazole (Noxafil) Barbiturates (Phenobarbital) Carbamazepine (Tegretol) Chlordiazepoxide (Librium) Cholestyramine (Questran) Griseofulvin Oral Contraceptives Rifampin Sucralfate (Carafate) Vitamin K   Coumadin (Warfarin) Major Herbal Interactions  Increased Warfarin Effect Decreased Warfarin Effect  Garlic Ginseng Ginkgo biloba Coenzyme Q10 Green tea [place]. Johns wort    Coumadin (Warfarin) FOOD Interactions  Eat a consistent number of servings per week of foods HIGH in Vitamin K (1 serving =  cup)  Collards (cooked, or boiled & drained) Kale (cooked, or boiled & drained) Mustard greens (cooked, or boiled & drained) Parsley *serving size only  =  cup Spinach (cooked, or boiled & drained) Swiss chard (cooked, or boiled & drained) Turnip greens (cooked, or boiled & drained)  Eat a consistent number of servings per week of foods MEDIUM-HIGH in Vitamin K (1 serving = 1 cup)  Asparagus (cooked, or boiled & drained) Broccoli (cooked, boiled & drained, or raw & chopped) Brussel sprouts (cooked, or boiled & drained) *serving size only =  cup Lettuce, raw (green leaf, endive, romaine) Spinach, raw Turnip greens, raw & chopped   These websites have more information on Coumadin (warfarin):  FailFactory.se; VeganReport.com.au;

## 2019-02-10 NOTE — Progress Notes (Signed)
ANTICOAGULATION CONSULT NOTE - Initial Consult  Pharmacy Consult for heparin Indication: atrial fibrillation  No Known Allergies  Patient Measurements: Height: 5\' 9"  (175.3 cm) Weight: 227 lb 8.2 oz (103.2 kg) IBW/kg (Calculated) : 70.7 Heparin Dosing Weight: 95kg  Vital Signs: Temp: 98.1 F (36.7 C) (07/24 0100) BP: 109/64 (07/24 0100) Pulse Rate: 89 (07/24 0100)  Labs: Recent Labs    02/09/19 1740 02/09/19 1756 02/09/19 1805 02/09/19 1955 02/09/19 2029 02/10/19 0032  HGB 15.9 16.3  --   --  15.6 14.7  HCT 47.1 48.0  --   --  46.0 42.8  PLT 188  --   --   --   --  122*  LABPROT  --   --   --   --   --  26.2*  INR  --   --   --   --   --  2.4*  CREATININE 1.89*  --   --   --   --  1.86*  TROPONINIHS  --   --  18* 45*  --   --     Estimated Creatinine Clearance: 43.1 mL/min (A) (by C-G formula based on SCr of 1.86 mg/dL (H)).   Medical History: Past Medical History:  Diagnosis Date  . Acute lower GI bleeding 02/15/2017  . Atrial fibrillation /flutter    hx of flutter ablation 2/12  . Cardiomyopathy    EF55% 11/14<<35%   . CHF (congestive heart failure) ([place])   . COPD (chronic obstructive pulmonary disease) ([place])   . GERD (gastroesophageal reflux disease)   . Gout   . Hepatitis    "think I had that once; a long time ago; from dirty needles I think" (12/14/2012)  . Hypertension   . Leukocytosis, unspecified 06/12/2013  . 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 Obesity   . Pneumonia X 1  . Primary polycythemia ([place]) 06/12/2013  . Renal insufficiency   . Substance abuse ([place])    alcohol; hx cocaine, heroin, crack use  . Syncope     Medications:  Medications Prior to Admission  Medication Sig Dispense Refill Last Dose  . albuterol (PROAIR HFA) 108 (90 Base) MCG/ACT inhaler Inhale 1-2 puffs into the lungs every 6 (six) hours as needed for wheezing or shortness of breath. 18 g 3   . amiodarone (PACERONE) 200 MG tablet Take 0.5 tablets (100 mg total)  by mouth daily. 45 tablet 3   . carvedilol (COREG) 3.125 MG tablet Take 1 tablet (3.125 mg total) by mouth daily. 90 tablet 2   . fluticasone (FLOVENT HFA) 44 MCG/ACT inhaler Inhale 2 puffs into the lungs 2 (two) times daily as needed (sob).      . Fluticasone-Umeclidin-Vilant (TRELEGY ELLIPTA) 100-62.5-25 MCG/INH AEPB Inhale 1 puff into the lungs daily. 60 each 5   . folic acid (FOLVITE) 1 MG tablet Take 1 tablet (1 mg total) by mouth daily. 90 tablet 3   . furosemide (LASIX) 20 MG tablet Take 1 tablet (20 mg total) by mouth daily. 90 tablet 3   . hydroxyurea (HYDREA) 500 MG capsule Take 2 capsules (1,000 mg total) by mouth daily. MAY TAKE WITH FOOD TO MINIMIZE GI SIDE EFFECTS 180 capsule 3   . ipratropium-albuterol (DUONEB) 0.5-2.5 (3) MG/3ML SOLN TAKE 3 MLS BY MOUTH EVERY 4 HOURS AS NEEDED FOR SHORTNESS OF BREATH (Patient taking differently: Inhale 3 mLs into the lungs every 6 (six) hours as needed (for shortness of breath or wheezing). ) 5400 mL 3   .  SUBOXONE 8-2 MG FILM Place 1 Film under the tongue daily.      [name] warfarin (COUMADIN) 2.5 MG tablet TAKE 1/2 TABLET BY MOUTH ON TUESDAY AND FRIDAY. TAKE 1 TABLET DAILY ON THE OTHER DAYS 30 tablet 2    Scheduled:  . amiodarone  100 mg Oral Daily  . budesonide (PULMICORT) nebulizer solution  0.5 mg Nebulization BID  . insulin aspart  0-15 Units Subcutaneous Q4H  . ipratropium-albuterol  3 mL Nebulization Q6H  . pantoprazole (PROTONIX) IV  40 mg Intravenous QHS   Infusions:  . fentaNYL infusion INTRAVENOUS 50 mcg/hr (02/09/19 2231)  . propofol      Assessment: 73yo male admitted for ARF/CHF requiring intubation, to transition from warfarin to heparin for Afib; current INR at goal.  Goal of Therapy:  Heparin level 0.3-0.7 units/ml Monitor platelets by anticoagulation protocol: Yes   Plan:  Will hold heparin until INR <2.  Wynona Neat, PharmD, BCPS  02/10/2019,1:45 AM

## 2019-02-10 NOTE — Progress Notes (Signed)
Nutrition Consult - Brief Note  Received MD Consult for TF initiation and management. Patient has been extubated and TF is no longer needed. Please re-consult if nutrition concerns arise.  Molli Barrows, RD, LDN, [place] Pager [phone] After Hours Pager [phone]

## 2019-02-10 NOTE — Progress Notes (Signed)
Doing well, breathing comfortably on [place]. Will have PT see him and work on progressive mobility. Check AM CXR. IS, flutter Appreciate [facility] taking over care starting tomorrow

## 2019-02-10 NOTE — Progress Notes (Signed)
ANTICOAGULATION CONSULT NOTE - Follow-Up Consult  Pharmacy Consult for Warfarin Indication: atrial fibrillation  Allergies  Allergen Reactions  . Codeine Shortness Of Breath and Rash    Patient Measurements: Height: 5\' 9"  (175.3 cm) Weight: 229 lb 8 oz (104.1 kg) IBW/kg (Calculated) : 70.7 Heparin Dosing Weight: 95kg  Vital Signs: Temp: 98.2 F (36.8 C) (07/24 1000) Temp Source: Bladder (07/24 0400) BP: 97/64 (07/24 1000) Pulse Rate: 98 (07/24 1000)  Labs: Recent Labs    02/09/19 1740  02/09/19 1805 02/09/19 1955 02/09/19 2029 02/10/19 0032 02/10/19 0852  HGB 15.9   < >  --   --  15.6 14.7 14.6  HCT 47.1   < >  --   --  46.0 42.8 43.0  PLT 188  --   --   --   --  122*  --   LABPROT  --   --   --   --   --  26.2*  --   INR  --   --   --   --   --  2.4*  --   CREATININE 1.89*  --   --   --   --  1.86*  --   TROPONINIHS  --   --  18* 45*  --   --   --    < > = values in this interval not displayed.    Estimated Creatinine Clearance: 43.3 mL/min (A) (by C-G formula based on SCr of 1.86 mg/dL (H)).   Assessment: 72 YOM on warfarin PTA for hx Afib - held on admit due to need to intubate. Now extubated and pharmacy consulted to resume dosing.   The patient's INR on admission was 2.4 and at goal on PTA dose of 2.5 mg daily EXCEPT for 1.25 mg on Tues/Thurs. Last dose 7/23.    Goal of Therapy:  INR 2-3 Monitor platelets by anticoagulation protocol: Yes   Plan:  - Resume PTA Warfarin dosing of 2.5 mg daily EXCEPT for 1.25 mg on Tues/Thurs - Will continue to monitor for any signs/symptoms of bleeding and will follow up with PT/INR in the a.m.   Thank you for allowing pharmacy to be a part of this patient's care.  Alycia Rossetti, PharmD, BCPS Clinical Pharmacist Clinical phone for 02/10/2019: [phone] 02/10/2019 1:19 PM   **Pharmacist phone directory can now be found on [email or web address] ([email or web address]).  Listed under [place].

## 2019-02-10 NOTE — Plan of Care (Signed)

## 2019-02-10 NOTE — Progress Notes (Signed)
[facility] ADULT ICU REPLACEMENT PROTOCOL FOR AM LAB REPLACEMENT ONLY  The patient does apply for the [facility] Adult ICU Electrolyte Replacment Protocol based on the criteria listed below:   1. Is GFR >/= 40 ml/min? Yes.    Patient's GFR today is 41 2. Is urine output >/= 0.5 ml/kg/hr for the last 6 hours? Yes.   Patient's UOP is 50 ml/kg/hr 3. Is BUN < 60 mg/dL? Yes.    Patient's BUN today is 26 4. Abnormal electrolyte(s):K-3.2  Mag-1.6. Ordered repletion with: K- 60meQ x2 q 4hrs and Mg 2 gms. 6. If a panic level lab has been reported, has the CCM MD in charge been notified? Yes.  .   Physician:  Dr. Jack Quarto, [name] 02/10/2019 6:13 AM

## 2019-02-10 NOTE — H&P (Signed)
NAME:  [name], MRN:  [id number], DOB:  1946-03-25, LOS: 1 ADMISSION DATE:  02/09/2019, CONSULTATION DATE: 7/23 REFERRING MD: Dr. Sherry Ruffing, CHIEF COMPLAINT: Hypoxic  Brief History   73 year old male presented to ED in respiratory distress on 7/23 and intubated for hypoxia.  Presumed pulmonary edema.  History of present illness   Patient is encephalopathic and/or intubated. Therefore history has been obtained from chart review.  73 year old male with past medical history as below, which is significant for atrial fibrillation on warfarin, heart failure with reduced ejection fraction, COPD, and obesity.  While sitting in the parking lot of [name] with his wife he developed respiratory distress.  EMS was dispatched and upon their arrival his oxygen saturations were in the 60s requiring bag-valve-mask ventilations.  Work of breathing was evident and he became fatigued upon arrival to the emergency department where he was intubated.  Chest x-ray demonstrated bilateral infiltrates.  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   Significant Hospital Events   7/23 admit  Consults:    Procedures:  ETT 7/23 >  Significant Diagnostic Tests:  COVID 7/23 > neg  Micro Data:    Antimicrobials:    Interim history/subjective:  No change overnight  Sedated and comfortable on vent  No pressors    Objective   Blood pressure 98/60, pulse 89, temperature 98.4 F (36.9 C), resp. rate 18, height  5\' 9"  (1.753 m), weight 104.1 kg, SpO2 95 %.    Vent Mode: PRVC FiO2 (%):  [60 %-100 %] 60 % Set Rate:  [18 bmp] 18 bmp Vt Set:  [420 mL-560 mL] 560 mL PEEP:  [5 cmH20-8 cmH20] 8 cmH20 Plateau Pressure:  [20 cmH20-26 cmH20] 20 cmH20   Intake/Output Summary (Last 24 hours) at 02/10/2019 3220 Last data filed at 02/10/2019 0700 Gross per 24 hour  Intake 197.01 ml  Output 300 ml  Net -102.99 ml   Filed Weights   02/09/19 1800 02/10/19 0000 02/10/19 0500  Weight: 99.8 kg 103.2 kg 104.1 kg    Examination: General: obese male on vent HENT: [place]/AT, PERRL, unable to appreciate JVD Lungs: Coarse Cardiovascular: RRR, no MRG Abdomen: Soft, non-distended Extremities: NO acute deformity or ROM limitation Neuro: Sedated  Resolved Hospital Problem list     Assessment & Plan:   Acute hypoxemic respiratory failure: likely in the setting of volume overload and pulmonary edema with underlying COPD  PLAN -  Vent support - 8cc/kg  F/u CXR  F/u ABG now  Continue diuresis as BP and renal function allow  Daily SBT     Acute on chronic HFpEF QTC prolongation (493 on admit) - Diurese as above - Echo pending  - Avoid QT prolonging agents - Holding home carvedilol due to borderline hypotension  Atrial fibrillation - heparin per pharmacy  - continue home amiodarone - holding home warfarin  COPD without acute exacerbation - Duonebs, budesonide nebs - holding home fluticasone, proair  Intermittent agitation  Hx ETOH, drug abuse  PLAN  -hold home suboxone  - PAD protocol with fentanyl gtt and PRN versed for now - consider transition to precedex once closer to weaning  Best practice:  Diet: TF started 7/24 Pain/Anxiety/Delirium protocol (if indicated): as above VAP protocol (if indicated): Yes DVT prophylaxis: heparin infusion GI prophylaxis: PPI Glucose control: na Mobility: BR Code Status: FULL Family Communication: will update  Disposition: ICU  Labs   CBC: Recent  Labs  Lab 02/09/19 1740 02/09/19 1756 02/09/19 2029 02/10/19 0032  WBC 8.1  --   --  6.9  NEUTROABS 5.5  --   --   --   HGB 15.9 16.3 15.6 14.7  HCT 47.1 48.0 46.0 42.8  MCV 123.3*  --   --  120.6*  PLT 188  --   --  122*    Basic Metabolic Panel: Recent Labs  Lab 02/09/19 1740 02/09/19 1756 02/09/19 2029 02/10/19 0032  NA 138 142 141 141  K 3.9 3.5 3.5 3.2*  CL 102  --   --  104  CO2 21*  --   --  25  GLUCOSE 217*  --   --  139*  BUN 25*  --   --  26*  CREATININE 1.89*  --   --  1.86*  CALCIUM 7.7*  --   --  7.7*  MG  --   --   --  1.6*  PHOS  --   --   --  3.6   GFR: Estimated Creatinine Clearance: 43.3 mL/min (A) (by C-G formula based on SCr of 1.86 mg/dL (H)). Recent Labs  Lab 02/09/19 1740 02/10/19 0032  WBC 8.1 6.9    Liver Function Tests: Recent Labs  Lab 02/09/19 1740  AST 38  ALT 16  ALKPHOS 101  BILITOT 0.7  PROT 6.6  ALBUMIN 3.3*   No results for input(s): LIPASE, AMYLASE in the last 168 hours. No results for input(s): AMMONIA in the last 168 hours.  ABG    Component Value Date/Time   PHART 7.291 (L) 02/09/2019 2029   PCO2ART 62.7 (H) 02/09/2019 2029   PO2ART 59.0 (L) 02/09/2019 2029   HCO3 30.2 (H) 02/09/2019 2029   TCO2 32 02/09/2019 2029   ACIDBASEDEF 2.0 02/09/2019 1756   O2SAT 86.0 02/09/2019 2029     Coagulation Profile: Recent Labs  Lab 02/10/19 0032  INR 2.4*    Cardiac Enzymes: No results for input(s): CKTOTAL, CKMB, CKMBINDEX, TROPONINI in the last 168 hours.  HbA1C: Hgb A1c MFr Bld  Date/Time Value Ref Range Status  02/10/2019 12:32 AM 5.8 (H) 4.8 - 5.6 % Final    Comment:    (NOTE) Pre diabetes:          5.7%-6.4% Diabetes:              >6.4% Glycemic control for   <7.0% adults with diabetes   04/13/2018 04:56 AM 5.4 4.8 - 5.6 % Final    Comment:    (NOTE) Pre diabetes:          5.7%-6.4% Diabetes:              >6.4% Glycemic control for   <7.0% adults with diabetes     CBG: Recent Labs  Lab  02/09/19 2324 02/10/19 0351 02/10/19 0742  GLUCAP 147* 149* 152*     Critical care time: 36mins     Nickolas Madrid, NP 02/10/2019  8:38 AM Pager: [phone] or [phone]

## 2019-02-10 NOTE — Progress Notes (Signed)
  Echocardiogram 2D Echocardiogram has been performed.  [name] 02/10/2019, 10:52 AM

## 2019-02-11 ENCOUNTER — Inpatient Hospital Stay (HOSPITAL_COMMUNITY): Payer: Medicare Other

## 2019-02-11 LAB — CBC
HCT: 40.8 % (ref 39.0–52.0)
Hemoglobin: 14 g/dL (ref 13.0–17.0)
MCH: 41.4 pg — ABNORMAL HIGH (ref 26.0–34.0)
MCHC: 34.3 g/dL (ref 30.0–36.0)
MCV: 120.7 fL — ABNORMAL HIGH (ref 80.0–100.0)
Platelets: 131 10*3/uL — ABNORMAL LOW (ref 150–400)
RBC: 3.38 MIL/uL — ABNORMAL LOW (ref 4.22–5.81)
RDW: 13.5 % (ref 11.5–15.5)
WBC: 13.2 10*3/uL — ABNORMAL HIGH (ref 4.0–10.5)
nRBC: 0 % (ref 0.0–0.2)

## 2019-02-11 LAB — PROTIME-INR
INR: 2.8 — ABNORMAL HIGH (ref 0.8–1.2)
Prothrombin Time: 28.7 seconds — ABNORMAL HIGH (ref 11.4–15.2)

## 2019-02-11 LAB — BASIC METABOLIC PANEL
Anion gap: 10 (ref 5–15)
BUN: 48 mg/dL — ABNORMAL HIGH (ref 8–23)
CO2: 26 mmol/L (ref 22–32)
Calcium: 8.3 mg/dL — ABNORMAL LOW (ref 8.9–10.3)
Chloride: 101 mmol/L (ref 98–111)
Creatinine, Ser: 2.06 mg/dL — ABNORMAL HIGH (ref 0.61–1.24)
GFR calc Af Amer: 36 mL/min — ABNORMAL LOW (ref >60)
GFR calc non Af Amer: 31 mL/min — ABNORMAL LOW (ref >60)
Glucose, Bld: 140 mg/dL — ABNORMAL HIGH (ref 70–99)
Potassium: 4.8 mmol/L (ref 3.5–5.1)
Sodium: 137 mmol/L (ref 135–145)

## 2019-02-11 LAB — PROCALCITONIN: Procalcitonin: 1.12 ng/mL

## 2019-02-11 MED ORDER — SODIUM CHLORIDE 0.9 % IV SOLN
INTRAVENOUS | Status: DC | PRN
  Administered 2019-02-11: 250 mL via INTRAVENOUS

## 2019-02-11 MED ORDER — IPRATROPIUM-ALBUTEROL 0.5-2.5 (3) MG/3ML IN SOLN
3.0000 mL | Freq: Three times a day (TID) | RESPIRATORY_TRACT | Status: DC
  Filled 2019-02-11: qty 3

## 2019-02-11 MED ORDER — IPRATROPIUM-ALBUTEROL 0.5-2.5 (3) MG/3ML IN SOLN: 3.0000 mL | Freq: Four times a day (QID) | RESPIRATORY_TRACT | Status: DC | PRN

## 2019-02-11 MED ORDER — IPRATROPIUM-ALBUTEROL 0.5-2.5 (3) MG/3ML IN SOLN: 3.0000 mL | Freq: Four times a day (QID) | RESPIRATORY_TRACT | Status: DC

## 2019-02-11 MED ORDER — SODIUM CHLORIDE 0.9 % IV SOLN
1.0000 g | INTRAVENOUS | Status: DC
  Administered 2019-02-11 – 2019-02-12 (×2): 1 g via INTRAVENOUS
  Filled 2019-02-11 (×2): qty 10

## 2019-02-11 MED ORDER — WARFARIN SODIUM 2.5 MG PO TABS
2.5000 mg | ORAL_TABLET | Freq: Once | ORAL | Status: AC
  Administered 2019-02-11: 2.5 mg via ORAL
  Filled 2019-02-11: qty 1

## 2019-02-11 NOTE — Progress Notes (Signed)
ANTICOAGULATION CONSULT NOTE - Follow-Up Consult  Pharmacy Consult for Warfarin Indication: atrial fibrillation  Allergies  Allergen Reactions  . Codeine Shortness Of Breath and Rash    Patient Measurements: Height: 5\' 6"  (167.6 cm) Weight: 228 lb 12.8 oz (103.8 kg)(scale b) IBW/kg (Calculated) : 63.8 Heparin Dosing Weight: 95kg  Vital Signs: Temp: 97.7 F (36.5 C) (07/25 0508) Temp Source: Oral (07/25 0508) BP: 96/57 (07/25 0508) Pulse Rate: 69 (07/25 0508)  Labs: Recent Labs    02/09/19 1740  02/09/19 1805 02/09/19 1955  02/10/19 0032 02/10/19 0852 02/11/19 0446  HGB 15.9   < >  --   --    < > 14.7 14.6 14.0  HCT 47.1   < >  --   --    < > 42.8 43.0 40.8  PLT 188  --   --   --   --  122*  --  131*  LABPROT  --   --   --   --   --  26.2*  --  28.7*  INR  --   --   --   --   --  2.4*  --  2.8*  CREATININE 1.89*  --   --   --   --  1.86*  --  2.06*  TROPONINIHS  --   --  18* 45*  --   --   --   --    < > = values in this interval not displayed.    Estimated Creatinine Clearance: 37.1 mL/min (A) (by C-G formula based on SCr of 2.06 mg/dL (H)).   Assessment: 72 YOM on warfarin PTA for hx Afib - held on admit due to need to intubate. Now extubated and pharmacy consulted to resume dosing. The patient's INR on admission was 2.4 and at goal on PTA dose of 2.5 mg daily EXCEPT for 1.25 mg on Tues/Thurs. Last dose 7/23.   INR (2.8) remains therapeutic after resuming PTA dosing regimen. CBC stable. No bleeding noted.  Goal of Therapy:  INR 2-3 Monitor platelets by anticoagulation protocol: Yes   Plan:  - Warfarin 2.5 mg x1 tonight - Continue PTA Warfarin dosing of 2.5 mg daily EXCEPT for 1.25 mg on Tues/Thurs - Will continue to monitor for any signs/symptoms of bleeding and will follow up with PT/INR in the a.m.   Richardine Service, PharmD PGY1 Pharmacy Resident Phone: [phone] 02/11/2019  9:18 AM  Please check [email or web address] for unit-specific pharmacy phone numbers.

## 2019-02-11 NOTE — Progress Notes (Signed)
Pt converted back to Afib, EKG completed, in Epic,MD Hardin notified

## 2019-02-11 NOTE — Progress Notes (Addendum)
PROGRESS NOTE  [name] DPO:[id number] DOB: January 17, 1946 DOA: 02/09/2019 PCP: Marianna Payment, MD  HPI/Recap of past 24 hours: Patient is encephalopathic and/or intubated. Therefore history has been obtained from chart review.  73 year old male with past medical history as below, which is significant for atrial fibrillation on warfarin, heart failure with reduced ejection fraction, COPD, and obesity.  While sitting in the parking lot of [name] with his wife he developed respiratory distress.  EMS was dispatched and upon their arrival his oxygen saturations were in the 60s requiring bag-valve-mask ventilations.  Work of breathing was evident and he became fatigued upon arrival to the emergency department where he was intubated.  Chest x-ray demonstrated bilateral infiltrates.  PCCM was asked to admit.  Transferred to [facility] service on 02/11/19.  Extubated on 02/10/19.  02/11/19: Patient was seen and examined at his bedside this morning.  He reports no  chest pain or dyspnea at rest.  Admits to intermittent productive cough.  PT assessed and recommended home health PT.  Currently on nasal cannula, not on oxygen supplementation at baseline.   Assessment/Plan: Active Problems:   Acute pulmonary edema (HCC)   Acute hypoxemic respiratory failure (HCC)   Acute hypoxic respiratory failure likely multifactorial secondary to suspected aspiration versus flash pulmonary edema versus community-acquired pneumonia, versus acute systolic CHF in the setting of COPD post extubation on 02/10/2019 Independently reviewed chest x-ray done on admission which showed cardiomegaly with increase in pulmonary vascularity indicative pulmonary edema Continue to maintain O2 sat present greater than 90% Continue diuretics and bronchodilators Home O2 evaluation  Acute systolic CHF 2D echo done on 02/10/2019 showed LVEF 35 to 40% Continue Coreg, amiodarone, oral Lasix 20 mg daily Urine output 700 cc in the  last 24 hours Continue strict I's and O's and daily weight Followed by Dr. 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  Disposition Plan: Discharge to home in 1 to 2 days when patient oxygen requirement is close to his baseline.   Consultants:  None  Procedures:  Extubation on 02/10/2019  Antimicrobials:  Rocephin and azithromycin  DVT prophylaxis: Coumadin  Objective: Vitals:   02/11/19 0508 02/11/19 0809 02/11/19 0917 02/11/19 1324  BP: (!) 96/57  96/66 92/62  Pulse: 69  85 61  Resp: 20  18 18   Temp: 97.7 F (36.5 C)   97.7 F (36.5 C)  TempSrc: Oral   Oral  SpO2: 96% 96% 100% 97%  Weight: 103.8 kg     Height:        Intake/Output Summary (Last 24 hours) at 02/11/2019 1334 Last data filed at 02/11/2019 1326 Gross per 24 hour  Intake 1080 ml  Output 1100 ml  Net -20 ml   [employer]   02/10/19  0500 02/10/19 1517 02/11/19 0508  Weight: 104.1 kg 102.6 kg 103.8 kg    Exam:  . General: 73 y.o. year-old male well developed well nourished in no acute distress.  Alert and oriented x3. . Cardiovascular:  Regular rate and rhythm with no rubs or gallops.  No thyromegaly or JVD noted.   [name] Respiratory: Mild rales at bases with no wheezes noted.  Good inspiratory effort. . Abdomen: Soft nontender nondistended with normal bowel sounds x4 quadrants. . Musculoskeletal: Trace lower extremity edema. 2/4 pulses in all 4 extremities. [name] Psychiatry: Mood is appropriate for condition and setting   Data Reviewed: CBC: Recent Labs  Lab 02/09/19 1740 02/09/19 1756 02/09/19 2029 02/10/19 0032 02/10/19 0852 02/11/19 0446  WBC 8.1  --   --  6.9  --  13.2*  NEUTROABS 5.5  --   --   --   --   --   HGB 15.9 16.3 15.6 14.7 14.6 14.0  HCT 47.1 48.0 46.0 42.8 43.0 40.8  MCV 123.3*  --   --  120.6*  --  120.7*  PLT 188  --   --  122*  --  939*   Basic Metabolic Panel: Recent Labs  Lab 02/09/19 1740 02/09/19 1756 02/09/19 2029 02/10/19 0032 02/10/19 0852 02/10/19 0923 02/11/19 0446  NA 138 142 141 141 142  --  137  K 3.9 3.5 3.5 3.2* 3.7  --  4.8  CL 102  --   --  104  --   --  101  CO2 21*  --   --  25  --   --  26  GLUCOSE 217*  --   --  139*  --   --  140*  BUN 25*  --   --  26*  --   --  48*  CREATININE 1.89*  --   --  1.86*  --   --  2.06*  CALCIUM 7.7*  --   --  7.7*  --   --  8.3*  MG  --   --   --  1.6*  --  2.1  --   PHOS  --   --   --  3.6  --  2.2*  --    GFR: Estimated Creatinine Clearance: 37.1 mL/min (A) (by C-G formula based on SCr of 2.06 mg/dL (H)). Liver Function Tests: Recent Labs  Lab 02/09/19 1740  AST 38  ALT 16  ALKPHOS 101  BILITOT 0.7  PROT 6.6  ALBUMIN 3.3*   No results for input(s): LIPASE, AMYLASE in the last 168 hours. No results for input(s): AMMONIA in the last 168 hours. Coagulation Profile: Recent Labs  Lab 02/10/19 0032 02/11/19 0446  INR 2.4* 2.8*   Cardiac Enzymes: No results for input(s): CKTOTAL, CKMB, CKMBINDEX, TROPONINI in the last 168 hours. BNP (last 3 results) No results for input(s): PROBNP in the last 8760 hours. HbA1C: Recent  Labs    02/10/19 0032  HGBA1C 5.8*   CBG: Recent Labs  Lab 02/09/19 2324 02/10/19 0351 02/10/19 0742 02/10/19 1137  GLUCAP 147* 149* 152* 180*   Lipid Profile: Recent Labs    02/09/19 1803 02/10/19 0032  TRIG 185* 144   Thyroid Function Tests: No results for input(s): TSH, T4TOTAL, FREET4, T3FREE, THYROIDAB in the last 72 hours. Anemia Panel: No results for input(s): VITAMINB12, FOLATE, FERRITIN, TIBC, IRON, RETICCTPCT in the last 72 hours. Urine analysis:    Component Value Date/Time   COLORURINE YELLOW 09/26/2015 1155  APPEARANCEUR CLEAR 09/26/2015 1155   LABSPEC 1.011 09/26/2015 1155   PHURINE 5.0 09/26/2015 1155   GLUCOSEU NEGATIVE 09/26/2015 1155   HGBUR SMALL (A) 09/26/2015 1155   BILIRUBINUR NEGATIVE 09/26/2015 1155   KETONESUR NEGATIVE 09/26/2015 1155   PROTEINUR 30 (A) 09/26/2015 1155   UROBILINOGEN 0.2 12/10/2014 0948   NITRITE NEGATIVE 09/26/2015 1155   LEUKOCYTESUR NEGATIVE 09/26/2015 1155   Sepsis Labs: @LABRCNTIP (procalcitonin:4,lacticidven:4)  ) Recent Results (from the past 240 hour(s))  SARS Coronavirus 2 (CEPHEID- Performed in [place] hospital lab), Hosp Order     Status: None   Collection Time: 02/09/19  5:45 PM   Specimen: Nasopharyngeal Swab  Result Value Ref Range Status   SARS Coronavirus 2 NEGATIVE NEGATIVE Final    Comment: (NOTE) If result is NEGATIVE SARS-CoV-2 target nucleic acids are NOT DETECTED. The SARS-CoV-2 RNA is generally detectable in upper and lower  respiratory specimens during the acute phase of infection. The lowest  concentration of SARS-CoV-2 viral copies this assay can detect is 250  copies / mL. A negative result does not preclude SARS-CoV-2 infection  and should not be used as the sole basis for treatment or other  patient management decisions.  A negative result may occur with  improper specimen collection / handling, submission of specimen other  than nasopharyngeal swab, presence of viral mutation(s)  within the  areas targeted by this assay, and inadequate number of viral copies  (<250 copies / mL). A negative result must be combined with clinical  observations, patient history, and epidemiological information. If result is POSITIVE SARS-CoV-2 target nucleic acids are DETECTED. The SARS-CoV-2 RNA is generally detectable in upper and lower  respiratory specimens dur ing the acute phase of infection.  Positive  results are indicative of active infection with SARS-CoV-2.  Clinical  correlation with patient history and other diagnostic information is  necessary to determine patient infection status.  Positive results do  not rule out bacterial infection or co-infection with other viruses. If result is PRESUMPTIVE POSTIVE SARS-CoV-2 nucleic acids MAY BE PRESENT.   A presumptive positive result was obtained on the submitted specimen  and confirmed on repeat testing.  While 2019 novel coronavirus  (SARS-CoV-2) nucleic acids may be present in the submitted sample  additional confirmatory testing may be necessary for epidemiological  and / or clinical management purposes  to differentiate between  SARS-CoV-2 and other Sarbecovirus currently known to infect humans.  If clinically indicated additional testing with an alternate test  methodology [phone]) is advised. The SARS-CoV-2 RNA is generally  detectable in upper and lower respiratory sp ecimens during the acute  phase of infection. The expected result is Negative. Fact Sheet for Patients:  StrictlyIdeas.no Fact Sheet for Healthcare Providers: BankingDealers.co.za This test is not yet approved or cleared by the [place] FDA and has been authorized for detection and/or diagnosis of SARS-CoV-2 by FDA under an Emergency Use Authorization (EUA).  This EUA will remain in effect (meaning this test can be used) for the duration of the COVID-19 declaration under Section 564(b)(1) of the Act,  21 U.S.C. section 360bbb-3(b)(1), unless the authorization is terminated or revoked sooner. Performed at [facility], [place] [address], [place], [place] [address]   MRSA PCR Screening     Status: None   Collection Time: 02/10/19 12:17 AM   Specimen: Nasopharyngeal  Result Value Ref Range Status   MRSA by PCR NEGATIVE NEGATIVE Final    Comment:        The GeneXpert  MRSA Assay (FDA approved for NASAL specimens only), is one component of a comprehensive MRSA colonization surveillance program. It is not intended to diagnose MRSA infection nor to guide or monitor treatment for MRSA infections. Performed at [facility], [place] [address] [place], [place] [address]       Studies: Dg Chest Port 1 View  Result Date: 02/11/2019 CLINICAL DATA:  Shortness of breath EXAM: PORTABLE CHEST 1 VIEW COMPARISON:  02/10/2019 and prior radiographs FINDINGS: Cardiomegaly again noted. Endotracheal tube and NG tube have been removed. Improved bilateral lung aeration with mild LEFT basilar atelectasis/airspace disease remaining. There may be very small bilateral pleural effusions present. No pneumothorax. IMPRESSION: Support apparatus removal with improved bilateral lung aeration. Electronically Signed   By: Margarette Canada M.D.   On: 02/11/2019 09:38    Scheduled Meds: . amiodarone  100 mg Oral Daily  . carvedilol  3.125 mg Oral BID WC  . Chlorhexidine Gluconate Cloth  6 each Topical Daily  . feeding supplement (PRO-STAT SUGAR FREE 64)  30 mL Per Tube BID  . fluticasone furoate-vilanterol  1 puff Inhalation Daily  . folic acid  1 mg Oral Daily  . furosemide  20 mg Oral Daily  . umeclidinium bromide  1 puff Inhalation Daily  . [START ON 02/14/2019] warfarin  1.25 mg Oral Once per day on Tue Thu  . warfarin  2.5 mg Oral ONCE-1800  . Warfarin - Pharmacist Dosing Inpatient   Does not apply q1800    Continuous Infusions:   LOS: 2 days     Kayleen Memos, MD [facility] Pager  [phone]  If 7PM-7AM, please contact night-coverage [email or web address] Password [email or web address] 02/11/2019, 1:34 PM

## 2019-02-11 NOTE — Evaluation (Addendum)
Physical Therapy Evaluation Patient Details Name: [name] MRN: [id number] DOB: 28-Jun-1946 Today's Date: 02/11/2019   History of Present Illness  Patient is a 73 year old male admited on 02/09/2019 with repiratory failure and subsequent intubation. He was extubated yesterday. Today he reports he is breathing better. He is on 2l of NCO2. FTD:DUKGURK, subtance abuse, CHF, renal insuffciency, gout, COPD, a-fib, gout, HTN   Clinical Impression  Patient presents with decreased endurance. His stability is good. He had no loss of balance with transfers or gait. He became SOB after about 40' of ambulation. His sai2 remained at 92% on 2L of nco2. His baseline was 96%%. He would benefit from home health at this time but may progress to where he does not need home PT. Acute therapy will continue to follow.     Follow Up Recommendations Home health PT;No PT follow up    Equipment Recommendations  None recommended by PT    Recommendations for Other Services Rehab consult     Precautions / Restrictions Precautions Precautions: None Restrictions Weight Bearing Restrictions: No      Mobility  Bed Mobility Overal bed mobility: Independent             General bed mobility comments: patient sait up at the edge of the bed without assistance or difficulty   Transfers Overall transfer level: Needs assistance Equipment used: None Transfers: Sit to/from Stand Sit to Stand: Supervision         General transfer comment: supervision for inital standing balance   Ambulation/Gait Ambulation/Gait assistance: Supervision Gait Distance (Feet): 40 Feet Assistive device: None Gait Pattern/deviations: Step-through pattern Gait velocity: decreased   General Gait Details: patient became SOB after about 40' but his sao2 remained at 92% on 2L of nco2. He was steady on his Optician, dispensing Rankin (Stroke Patients Only)        Balance                                             Pertinent Vitals/Pain Pain Assessment: No/denies pain    Home Living Family/patient expects to be discharged to:: Private residence Living Arrangements: Spouse/significant other;Children Available Help at Discharge: Family Type of Home: House Home Access: Stairs to enter   Technical brewer of Steps: 3          Prior Function Level of Independence: Independent         Comments: used a cane at times he reports but not often      Hand Dominance   Dominant Hand: Right    Extremity/Trunk Assessment   Upper Extremity Assessment Upper Extremity Assessment: Overall WFL for tasks assessed    Lower Extremity Assessment Lower Extremity Assessment: Overall WFL for tasks assessed    Cervical / Trunk Assessment Cervical / Trunk Assessment: Kyphotic  Communication   Communication: No difficulties  Cognition Arousal/Alertness: Awake/alert Behavior During Therapy: WFL for tasks assessed/performed Overall Cognitive Status: Within Functional Limits for tasks assessed                                 General Comments: alert and motivated to get moving       General Comments General comments (skin integrity, edema,  etc.): 2L of NCo2     Exercises     Assessment/Plan    PT Assessment Patient needs continued PT services  PT Problem List Decreased activity tolerance;Decreased mobility;Cardiopulmonary status limiting activity       PT Treatment Interventions      PT Goals (Current goals can be found in the Care Plan section)  Acute Rehab PT Goals Patient Stated Goal: to go home  PT Goal Formulation: With patient Time For Goal Achievement: 02/18/19 Potential to Achieve Goals: Good    Frequency Min 2X/week   Barriers to discharge        Co-evaluation               AM-PAC PT "6 Clicks" Mobility  Outcome Measure Help needed turning from your back to your side  while in a flat bed without using bedrails?: A Little Help needed moving from lying on your back to sitting on the side of a flat bed without using bedrails?: A Little Help needed moving to and from a bed to a chair (including a wheelchair)?: A Little Help needed standing up from a chair using your arms (e.g., wheelchair or bedside chair)?: A Little Help needed to walk in hospital room?: A Little Help needed climbing 3-5 steps with a railing? : A Little 6 Click Score: 18    End of Session Equipment Utilized During Treatment: Gait belt Activity Tolerance: Patient tolerated treatment well Patient left: in chair;with call bell/phone within reach Nurse Communication: Mobility status PT Visit Diagnosis: Other abnormalities of gait and mobility (R26.89)    Time: 6438-3818 PT Time Calculation (min) (ACUTE ONLY): 19 min   Charges:   PT Evaluation $PT Eval Moderate Complexity: 1 Mod           Carney Living PT DPT  02/11/2019, 1:27 PM

## 2019-02-12 ENCOUNTER — Encounter (HOSPITAL_COMMUNITY): Payer: Self-pay | Admitting: Cardiology

## 2019-02-12 DIAGNOSIS — I4819 Other persistent atrial fibrillation: Secondary | ICD-10-CM

## 2019-02-12 DIAGNOSIS — N183 Chronic kidney disease, stage 3 (moderate): Secondary | ICD-10-CM

## 2019-02-12 DIAGNOSIS — I429 Cardiomyopathy, unspecified: Secondary | ICD-10-CM

## 2019-02-12 LAB — BASIC METABOLIC PANEL
Anion gap: 9 (ref 5–15)
BUN: 52 mg/dL — ABNORMAL HIGH (ref 8–23)
CO2: 26 mmol/L (ref 22–32)
Calcium: 7.9 mg/dL — ABNORMAL LOW (ref 8.9–10.3)
Chloride: 102 mmol/L (ref 98–111)
Creatinine, Ser: 1.7 mg/dL — ABNORMAL HIGH (ref 0.61–1.24)
GFR calc Af Amer: 46 mL/min — ABNORMAL LOW (ref >60)
GFR calc non Af Amer: 40 mL/min — ABNORMAL LOW (ref >60)
Glucose, Bld: 102 mg/dL — ABNORMAL HIGH (ref 70–99)
Potassium: 4.3 mmol/L (ref 3.5–5.1)
Sodium: 137 mmol/L (ref 135–145)

## 2019-02-12 LAB — CBC WITH DIFFERENTIAL/PLATELET
Abs Immature Granulocytes: 0.03 10*3/uL (ref 0.00–0.07)
Basophils Absolute: 0 10*3/uL (ref 0.0–0.1)
Basophils Relative: 0 %
Eosinophils Absolute: 0 10*3/uL (ref 0.0–0.5)
Eosinophils Relative: 0 %
HCT: 41.1 % (ref 39.0–52.0)
Hemoglobin: 13.9 g/dL (ref 13.0–17.0)
Immature Granulocytes: 0 %
Lymphocytes Relative: 11 %
Lymphs Abs: 0.9 10*3/uL (ref 0.7–4.0)
MCH: 41.6 pg — ABNORMAL HIGH (ref 26.0–34.0)
MCHC: 33.8 g/dL (ref 30.0–36.0)
MCV: 123.1 fL — ABNORMAL HIGH (ref 80.0–100.0)
Monocytes Absolute: 0.8 10*3/uL (ref 0.1–1.0)
Monocytes Relative: 10 %
Neutro Abs: 6.5 10*3/uL (ref 1.7–7.7)
Neutrophils Relative %: 79 %
Platelets: 133 10*3/uL — ABNORMAL LOW (ref 150–400)
RBC: 3.34 MIL/uL — ABNORMAL LOW (ref 4.22–5.81)
RDW: 13.5 % (ref 11.5–15.5)
WBC: 8.3 10*3/uL (ref 4.0–10.5)
nRBC: 0 % (ref 0.0–0.2)

## 2019-02-12 LAB — PROCALCITONIN: Procalcitonin: 0.55 ng/mL

## 2019-02-12 LAB — PROTIME-INR
INR: 2.5 — ABNORMAL HIGH (ref 0.8–1.2)
Prothrombin Time: 26.5 seconds — ABNORMAL HIGH (ref 11.4–15.2)

## 2019-02-12 MED ORDER — CAMPHOR-MENTHOL 0.5-0.5 % EX LOTN
TOPICAL_LOTION | Freq: Three times a day (TID) | CUTANEOUS | Status: DC
  Administered 2019-02-12 – 2019-02-13 (×3): via TOPICAL
  Filled 2019-02-12: qty 222

## 2019-02-12 MED ORDER — WARFARIN SODIUM 2.5 MG PO TABS
2.5000 mg | ORAL_TABLET | Freq: Once | ORAL | Status: AC
  Administered 2019-02-12: 2.5 mg via ORAL
  Filled 2019-02-12: qty 1

## 2019-02-12 NOTE — Consult Note (Addendum)
Cardiology Consultation:   Patient ID: [name] MRN: [id number]; DOB: 1946-01-24  Admit date: 02/09/2019 Date of Consult: 02/12/2019  Primary Care Provider: Marianna Payment, MD Primary Electrophysiologist:  Cristopher Peru, MD   Patient Profile:   [name] is a 73 y.o. male with a hx of persistent atrial fibrillation, previous atrial flutter ablation in 2012, cardiomyopathy, and hypertension who is being seen today for the evaluation of cardiomyopathy at the request of Dr. Nevada Crane.  History of Present Illness:   [name] was admitted to the hospital on July 23 after an episode of apparent sudden onset shortness of breath and subsequently documented hypoxic respiratory failure.  This occurred while he was at rest, in his car at a [employer].  In speaking with him today, he does not recall much about the event, but does not specifically remember any chest pain or sense of palpitations, did not have syncope.  He has had no preceding cough or worsening shortness of breath by his recollection.  He was managed on critical care team requiring mechanical ventilation, SARS coronavirus 2 was negative.  He has been treated for possible community-acquired pneumonia and COPD exacerbation.  During his assessment a follow-up echocardiogram was obtained revealing LVEF approximately 35 to 40%, previously had been normal range as of 2015, although he does have a prior documented history of cardiomyopathy with LVEF in similar range back in 2014.  He sees Dr. Lovena Le as an outpatient with a history of paroxysmal atrial fibrillation on amiodarone and Coumadin, also previous atrial flutter ablation in 2012.  He is in rate controlled atrial fibrillation at this time.  History also includes CKD stage III with acute renal insufficiency during present hospital stay.  He has not been on ACE inhibitor or ARB as an outpatient.  Heart Pathway Score:     Past Medical History:   Diagnosis Date  . Acute lower GI bleeding 02/15/2017  . Atrial fibrillation ([place])   . Cardiomyopathy    EF55% 11/14<<35%   . CHF (congestive heart failure) ([place])   . COPD (chronic obstructive pulmonary disease) ([place])   . Essential hypertension   . GERD (gastroesophageal reflux disease)   . Gout   . Hepatitis    Possible history  . History of atrial flutter    Ablation 2012  . 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 Obesity   . Pneumonia X 1  . Primary polycythemia ([place]) 06/12/2013  . Renal insufficiency   . Substance abuse ([place])    History of alcohol; hx cocaine, heroin, crack use  . Syncope     Past Surgical History:  Procedure Laterality Date  . CARDIAC ELECTROPHYSIOLOGY MAPPING AND ABLATION  08/2010   [name] 09/07/2010 (12/14/2012)  . COLONOSCOPY     15-20 years ago had colon in [place]  . EXCISIONAL HEMORRHOIDECTOMY  1970's  . LOOP RECORDER IMPLANT N/A 08/23/2013   Procedure: LOOP RECORDER IMPLANT;  Surgeon: Deboraha Sprang, MD;  [facility] C[facility] LAB;  Service: Cardiovascular;  Laterality: N/A;  . MULTIPLE EXTRACTIONS WITH ALVEOLOPLASTY Bilateral 01/24/2016   Procedure: MULTIPLE EXTRACTION WITH ALVEOLOPLASTY BILATERAL;  Surgeon: Diona Browner, DDS;  Location: [facility];  Service: Oral Surgery;  Laterality: Bilateral;  . MULTIPLE TOOTH EXTRACTIONS  01/24/2016   MULTIPLE EXTRACTION WITH ALVEOLOPLASTY BILATERAL (Bilateral)     Home Medications:  Prior to Admission medications   Medication Sig Start Date End Date Taking? Authorizing Provider  albuterol (PROAIR HFA) 108 (90 Base) MCG/ACT inhaler Inhale 1-2 puffs into  the lungs every 6 (six) hours as needed for wheezing or shortness of breath. 01/11/19  Yes [name], MD  amiodarone (PACERONE) 200 MG tablet Take 0.5 tablets (100 mg total) by mouth daily. 06/20/18  Yes Seiler, Amber K, NP  carvedilol (COREG) 3.125 MG tablet Take 1 tablet (3.125 mg total) by mouth daily. 01/12/19  Yes [name], MD  Fluticasone-Umeclidin-Vilant  (TRELEGY ELLIPTA) 100-62.5-25 MCG/INH AEPB Inhale 1 puff into the lungs daily. 01/11/19  Yes [name], MD  folic acid (FOLVITE) 1 MG tablet Take 1 tablet (1 mg total) by mouth daily. 09/02/18  Yes [name], MD  furosemide (LASIX) 20 MG tablet Take 1 tablet (20 mg total) by mouth daily. 03/17/18 02/10/19 Yes Seiler, Amber K, NP  hydroxyurea (HYDREA) 500 MG capsule Take 2 capsules (1,000 mg total) by mouth daily. MAY TAKE WITH FOOD TO MINIMIZE GI SIDE EFFECTS Patient taking differently: Take 1,000 mg by mouth daily.  05/20/18  Yes [name], MD  SUBOXONE 8-2 MG FILM Place 1 Film under the tongue daily.  08/21/15  Yes [provider]  warfarin (COUMADIN) 2.5 MG tablet TAKE 1/2 TABLET BY MOUTH ON TUESDAY AND FRIDAY. TAKE 1 TABLET DAILY ON THE OTHER DAYS Patient taking differently: Take 1.25-2.5 mg by mouth See admin instructions. Per 12/27/18 visit anticoagulant dose Take 1.25 mg on Tuesday and Thursday 2.5 mg all the other days 01/30/19  Yes Aldine Contes, MD    Inpatient Medications: Scheduled Meds: . amiodarone  100 mg Oral Daily  . carvedilol  3.125 mg Oral BID WC  . Chlorhexidine Gluconate Cloth  6 each Topical Daily  . feeding supplement (PRO-STAT SUGAR FREE 64)  30 mL Per Tube BID  . fluticasone furoate-vilanterol  1 puff Inhalation Daily  . folic acid  1 mg Oral Daily  . furosemide  20 mg Oral Daily  . umeclidinium bromide  1 puff Inhalation Daily  . [START ON 02/14/2019] warfarin  1.25 mg Oral Once per day on Tue Thu  . Warfarin - Pharmacist Dosing Inpatient   Does not apply q1800   Continuous Infusions: . sodium chloride 250 mL (02/11/19 1610)  . cefTRIAXone (ROCEPHIN)  IV 1 g (02/11/19 1611)   PRN Meds: sodium chloride, ipratropium-albuterol  Allergies:    Allergies  Allergen Reactions  . Codeine Shortness Of Breath and Rash    Social History:   Social History   Socioeconomic History  . Marital status: Single    Spouse name: Not on file  . Number  of children: Not on file  . Years of education: Not on file  . Highest education level: Not on file  Occupational History  . Occupation: Retired  Scientific laboratory technician  . Financial resource strain: Not on file  . Food insecurity    Worry: Not on file    Inability: Not on file  . Transportation needs    Medical: Not on file    Non-medical: Not on file  Tobacco Use  . Smoking status: Current Every Day Smoker    Packs/day: 0.10    Years: 50.00    Pack years: 5.00    Types: Cigarettes  . Smokeless tobacco: Never Used  Substance and Sexual Activity  . Alcohol use: Yes    Alcohol/week: 89.0 standard drinks    Types: 12 Cans of beer, 77 Shots of liquor per week    Comment: Sometimes.  . Drug use: Not Currently    Types: "Crack" cocaine, Cocaine, Heroin  Comment: 02/15/2017 "last used crack 4-5 years ago; last used heroin a couple years ago; currently denies any drug use-- on suboxone.   . Sexual activity: Not Currently  Lifestyle  . Physical activity    Days per week: Not on file    Minutes per session: Not on file  . Stress: Not on file  Relationships  . Social Herbalist on phone: Not on file    Gets together: Not on file    Attends religious service: Not on file    Active member of club or organization: Not on file    Attends meetings of clubs or organizations: Not on file    Relationship status: Not on file  . Intimate partner violence    Fear of current or ex partner: Not on file    Emotionally abused: Not on file    Physically abused: Not on file    Forced sexual activity: Not on file  Other Topics Concern  . Not on file  Social History Narrative   Moved here from [place]. Lives with common law wife and grandchildren. He is retired from "general labor."    Family History:   Family History  Problem Relation Age of Onset  . Diabetes Mother   . Hypertension Mother   . Cirrhosis Father   . Alcohol abuse Father   . Colon cancer Neg Hx   . Rectal cancer Neg Hx    . Stomach cancer Neg Hx      ROS:  Please see the history of present illness. Mild chronic leg swelling.  All other ROS reviewed and negative.     Physical Exam/Data:   Vitals:   02/12/19 0427 02/12/19 0816 02/12/19 0828 02/12/19 1033  BP: 105/72  (!) 125/98 107/68  Pulse: 73  76 73  Resp: 18  18   Temp: (!) 97.5 F (36.4 C)     TempSrc: Oral     SpO2: 96% 93% 98%   Weight: 104.3 kg     Height:        Intake/Output Summary (Last 24 hours) at 02/12/2019 1123 Last data filed at 02/12/2019 0318 Gross per 24 hour  Intake 700 ml  Output 1100 ml  Net -400 ml   Last 3 Weights 02/12/2019 02/11/2019 02/10/2019  Weight (lbs) 230 lb 228 lb 12.8 oz 226 lb 1.6 oz  Weight (kg) 104.327 kg 103.783 kg 102.558 kg     Body mass index is 37.12 kg/m.  General: Overweight male, in no acute distress HEENT: normal Lymph: no adenopathy Neck: no JVD Endocrine:  No thryomegaly Vascular: No carotid bruits; FA pulses 2+ bilaterally without bruits  Cardiac: Irregularly irregular, no gallop. Lungs: Decreased breath sounds without wheezing, rhonchi or rales  Abd: soft, nontender, no hepatomegaly  Ext: Chronic appearing lower leg edema and venous stasis Musculoskeletal:  No deformities, BUE and BLE strength normal and equal Skin: warm and dry  Neuro:  CNs 2-12 intact, no focal abnormalities noted Psych:  Normal affect   EKG:  The EKG was personally reviewed and demonstrates: Rate controlled atrial fibrillation with prolonged QT interval. Telemetry:  Telemetry was personally reviewed and demonstrates: Atrial fibrillation.  Relevant CV Studies:  Echocardiogram 02/10/2019:  1. The left ventricle has moderately reduced systolic function, with an ejection fraction of 35-40%. The cavity size was moderately dilated. There is mild concentric left ventricular hypertrophy. Left ventricular diastolic function could not be  evaluated secondary to atrial fibrillation. Left ventricular diffuse hypokinesis.   2.  The right ventricle has normal systolic function. The cavity was normal. There is no increase in right ventricular wall thickness. Right ventricular systolic pressure is normal with an estimated pressure of 26.7 mmHg.  3. Large pleural effusion in the left lateral region.  4. Small pericardial effusion.  5. The pericardial effusion is posterior to the left ventricle.  6. Mild thickening of the mitral valve leaflet.  7. The aortic valve is tricuspid. Aortic valve regurgitation is moderate by color flow Doppler.  8. The aorta is abnormal in size and structure.  Laboratory Data:  High Sensitivity Troponin:   Recent Labs  Lab 02/09/19 1805 02/09/19 1955  TROPONINIHS 18* 45*      Chemistry Recent Labs  Lab 02/10/19 0032 02/10/19 0852 02/11/19 0446 02/12/19 0729  NA 141 142 137 137  K 3.2* 3.7 4.8 4.3  CL 104  --  101 102  CO2 25  --  26 26  GLUCOSE 139*  --  140* 102*  BUN 26*  --  48* 52*  CREATININE 1.86*  --  2.06* 1.70*  CALCIUM 7.7*  --  8.3* 7.9*  GFRNONAA 36*  --  31* 40*  GFRAA 41*  --  36* 46*  ANIONGAP 12  --  10 9    Recent Labs  Lab 02/09/19 1740  PROT 6.6  ALBUMIN 3.3*  AST 38  ALT 16  ALKPHOS 101  BILITOT 0.7   Hematology Recent Labs  Lab 02/10/19 0032 02/10/19 0852 02/11/19 0446 02/12/19 0729  WBC 6.9  --  13.2* 8.3  RBC 3.55*  --  3.38* 3.34*  HGB 14.7 14.6 14.0 13.9  HCT 42.8 43.0 40.8 41.1  MCV 120.6*  --  120.7* 123.1*  MCH 41.4*  --  41.4* 41.6*  MCHC 34.3  --  34.3 33.8  RDW 13.3  --  13.5 13.5  PLT 122*  --  131* 133*   BNP Recent Labs  Lab 02/09/19 1806  BNP 419.9*     Radiology/Studies:  Dg Chest Port 1 View  Result Date: 02/11/2019 CLINICAL DATA:  Shortness of breath EXAM: PORTABLE CHEST 1 VIEW COMPARISON:  02/10/2019 and prior radiographs FINDINGS: Cardiomegaly again noted. Endotracheal tube and NG tube have been removed. Improved bilateral lung aeration with mild LEFT basilar atelectasis/airspace disease remaining.  There may be very small bilateral pleural effusions present. No pneumothorax. IMPRESSION: Support apparatus removal with improved bilateral lung aeration. Electronically Signed   By: Margarette Canada M.D.   On: 02/11/2019 09:38   Dg Chest Port 1 View  Result Date: 02/10/2019 CLINICAL DATA:  Respiratory distress. EXAM: PORTABLE CHEST 1 VIEW COMPARISON:  02/09/2019 and 08/15/2018 FINDINGS: Enteric tube courses into the stomach and off the film as tip is not visualized. Endotracheal tube tip is somewhat difficult to identify but appears to be unchanged just above the origin of the right mainstem bronchus 2.5 cm superior to the carina. Lungs are hypoinflated with mild worsening opacification over the left base which may be due to atelectasis and effusion versus infection. Interval improvement in prominence of the perihilar markings. Stable cardiomegaly. Remainder the exam is unchanged. IMPRESSION: Worsening left base opacification which may be due to effusion with atelectasis versus pneumonia. Moderate interval improvement with minimal residual vascular congestion. Stable cardiomegaly. Tubes and lines as described. Electronically Signed   By: Marin Olp M.D.   On: 02/10/2019 08:07   Dg Chest Portable 1 View  Result Date: 02/09/2019 CLINICAL DATA:  Endotracheal tube placement and orogastric tube placement.  EXAM: PORTABLE CHEST 1 VIEW COMPARISON:  Chest radiograph performed 12/17/2018 FINDINGS: The patient's endotracheal tube is seen ending 2 cm above the carina. The enteric tube is seen extending below the diaphragm. Vascular congestion is noted. Vague hazy opacities are noted bilaterally; given clinical concern, COVID-19 cannot be excluded. Pulmonary edema could conceivably have a similar appearance. No significant pleural effusion or pneumothorax is seen. The cardiomediastinal silhouette is mildly enlarged. No acute osseous abnormalities are identified. IMPRESSION: 1. Endotracheal tube seen ending 2 cm above the  carina. 2. Enteric tube noted extending below the diaphragm. 3. Vague hazy airspace opacities noted bilaterally. Given clinical concern, COVID-19 cannot be excluded. Pulmonary edema could conceivably have a similar appearance. 4. Vascular congestion and mild cardiomegaly. Electronically Signed   By: Garald Balding M.D.   On: 02/09/2019 18:04    Assessment and Plan:   1.  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  There is diffuse LV hypokinesis suggestive of nonischemic etiology.  High-sensitivity troponin I levels argue against ACS.  He has a prior documented history of cardiomyopathy with improvement in LVEF as of 2015.  He has been on Coreg as an outpatient.  2.  Paroxysmal to persistent atrial fibrillation, presently rate controlled and asymptomatic.  He is on Coumadin for stroke prophylaxis along with low-dose amiodarone.  3.  Presentation with fairly sudden onset of shortness of breath and acute hypoxic respiratory failure, etiology is not entirely clear.  He was SARS coronavirus 2 negative, is being treated for possible community-acquired pneumonia and COPD exacerbation, although did not necessarily report any slowly progressing symptoms leading up to the event.  He has had no arrhythmias beyond atrial fibrillation during present hospital stay.  4.  CKD stage III with acute renal insufficiency, creatinine has come down to 1.7 and potassium normal range.  Recommend continuing Coreg and amiodarone both at low-dose with adequate heart rate control now in atrial fibrillation.  Also continue Coumadin and low-dose Lasix.  He is not a good candidate for ACE inhibitor/ANRI/ARB/Aldactone due to CKD stage III with recent exacerbation.  Present low normal blood pressure does not warrant addition of hydralazine and nitrate at this time.  Do not necessarily anticipate any further inpatient cardiac testing, although he should be set up for follow-up  with Dr. Lovena Le or APP after discharge.  I would suggest placement of a 14-day ZIO patch at discharge to further investigate heart rate variability and exclude other potential arrhythmias that could have contributed to his presentation.  Whether he not he needs to be considered for a cardioversion ultimately versus discontinuation of amiodarone (if the strategy is heart rate control and anticoagulation) can be further considered an outpatient follow-up.  For questions or updates, please contact [place] Please consult [email or web address] for contact info under     Signed, Rozann Lesches, MD  02/12/2019 11:23 AM

## 2019-02-12 NOTE — Progress Notes (Signed)
PROGRESS NOTE  [place] CHY:[id number] DOB: 1945-10-19 DOA: 02/09/2019 PCP: Marianna Payment, MD  HPI/Recap of past 24 hours: Patient is encephalopathic and/or intubated. Therefore history has been obtained from chart review.  73 year old male with past medical history as below, which is significant for atrial fibrillation on warfarin, heart failure with reduced ejection fraction, COPD, and obesity.  While sitting in the parking lot of [name] with his wife he developed respiratory distress.  EMS was dispatched and upon their arrival his oxygen saturations were in the 60s requiring bag-valve-mask ventilations.  Work of breathing was evident and he became fatigued upon arrival to the emergency department where he was intubated.  Chest x-ray demonstrated bilateral infiltrates.  PCCM was asked to admit.  Transferred to [facility] service on 02/11/19.  Extubated on 02/10/19.  02/12/19: Patient was seen and examined at his bedside this morning.  He denies chest pain, palpitations.  Admits to dyspnea with ambulation.  Not on oxygen supplementation at baseline.  Assessment/Plan: Active Problems:   Acute pulmonary edema (HCC)   Acute hypoxemic respiratory failure (HCC)   Acute hypoxic respiratory failure likely multifactorial secondary to suspected aspiration versus flash pulmonary edema versus community-acquired pneumonia, versus acute systolic CHF in the setting of COPD post extubation on 02/10/2019 Independently reviewed chest x-ray done on admission which showed cardiomegaly with increase in pulmonary vascularity indicative pulmonary edema Repeat a chest x-ray done on 02/11/19 with improved lung aeration, personally reviewed. Continue diuretics and bronchodilators Continue to maintain O2 saturation greater than 90% Obtain home O2 evaluation for DC planning  Acute systolic CHF 2D echo done on 02/10/2019 showed LVEF 35 to 40% Continue Coreg, amiodarone, oral Lasix 20 mg daily Urine  output net I&O -880 cc, unclear if accurate. Continue strict I's and O's and daily weight Followed by Dr. Lovena Le outpatient Cardiology has been consulted.  Appreciate recommendations.  Community-acquired pneumonia, POA Leukocytosis with WBC 13 K, procalcitonin 1.30 Continue Rocephin IV Zithromax and Continue bronchodilators and nebs  Resolving AKI on CKD 3 Creatinine 2.06 on 02/11/2019 from 1.86 with GFR of 36 Creatinine today 1.70 with GFR 40 Continue to avoid nephrotoxins On Lasix 20 mg daily Monitor urine output  Acute COPD exacerbation likely multifactorial secondary to above Not on O2 supplementation at baseline Continue bronchodilators Continue scheduled nebulizer Maintain O2 saturation greater than 90%  History of heroine abuse Continue Suboxone is being held  Suspected undiagnosed OSA Recommend polysomnography outpatient  Paroxysmal A. 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  Disposition Plan: Discharge to home possibly tomorrow 02/13/2019 or when cardiology signs off.  Consultants:  Cardiology  Procedures:  Extubation on 02/10/2019  Antimicrobials:  Rocephin and azithromycin  DVT prophylaxis: Coumadin  Objective: Vitals:   02/12/19 0427 02/12/19 0816 02/12/19 0828 02/12/19 1033  BP: 105/72  (!) 125/98 107/68  Pulse: 73  76 73  Resp: 18  18   Temp: (!) 97.5 F (36.4 C)     TempSrc: Oral     SpO2: 96% 93% 98%   Weight: 104.3 kg     Height:        Intake/Output Summary (Last 24 hours) at 02/12/2019 1253 Last data filed at 02/12/2019 0318 Gross per 24 hour  Intake 700 ml  Output 1100 ml  Net -400 ml  Filed Weights   02/10/19 1517 02/11/19 0508 02/12/19 0427  Weight: 102.6 kg 103.8 kg 104.3 kg    Exam:   . General: 73 y.o. year-old male well developed well nourished in no acute distress.  Alert and oriented times 3. . Cardiovascular: Regular rate and rhythm no rubs or gallops no JVD or thyromegaly noted . Respiratory: Mild rales at bases no wheezes noted.  Poor inspiratory effort.  . Abdomen: Soft nontender nondistended normal bowel sounds present.  . Musculoskeletal: Trace lower extremity edema.  12 4 pulses in both lower extremities.. . Psychiatry: Mood is appropriate for condition and setting.   Data Reviewed: CBC: Recent Labs  Lab 02/09/19 1740  02/09/19 2029 02/10/19 0032 02/10/19 0852 02/11/19 0446 02/12/19 0729  WBC 8.1  --   --  6.9  --  13.2* 8.3  NEUTROABS 5.5  --   --   --   --   --  6.5  HGB 15.9   < > 15.6 14.7 14.6 14.0 13.9  HCT 47.1   < > 46.0 42.8 43.0 40.8 41.1  MCV 123.3*  --   --  120.6*  --  120.7* 123.1*  PLT 188  --   --  122*  --  131* 133*   < > = values in this interval not displayed.   Basic Metabolic Panel: Recent Labs  Lab 02/09/19 1740  02/09/19 2029 02/10/19 0032 02/10/19 0852 02/10/19 0923 02/11/19 0446 02/12/19 0729  NA 138   < > 141 141 142  --  137 137  K 3.9   < > 3.5 3.2* 3.7  --  4.8 4.3  CL 102  --   --  104  --   --  101 102  CO2 21*  --   --  25  --   --  26 26  GLUCOSE 217*  --   --  139*  --   --  140* 102*  BUN 25*  --   --  26*  --   --  48* 52*  CREATININE 1.89*  --   --  1.86*  --   --  2.06* 1.70*  CALCIUM 7.7*  --   --  7.7*  --   --  8.3* 7.9*  MG  --   --   --  1.6*  --  2.1  --   --   PHOS  --   --   --  3.6  --  2.2*  --   --    < > = values in this interval not displayed.   GFR: Estimated Creatinine Clearance: 45.1 mL/min (A) (by C-G formula based on SCr of 1.7 mg/dL (H)). Liver Function Tests: Recent Labs  Lab 02/09/19 1740  AST 38  ALT 16  ALKPHOS 101  BILITOT 0.7  PROT 6.6  ALBUMIN 3.3*   No results for input(s): LIPASE, AMYLASE in the last 168 hours. No results for input(s): AMMONIA in the last  168 hours. Coagulation Profile: Recent Labs  Lab 02/10/19 0032 02/11/19 0446 02/12/19 0729  INR 2.4* 2.8* 2.5*   Cardiac Enzymes: No results for input(s): CKTOTAL, CKMB, CKMBINDEX, TROPONINI in the last 168 hours. BNP (last 3 results) No results for input(s): PROBNP in the last 8760 hours. HbA1C: Recent Labs    02/10/19 0032  HGBA1C 5.8*   CBG: Recent Labs  Lab 02/09/19 2324 02/10/19 0351 02/10/19 0742 02/10/19 1137  GLUCAP 147* 149* 152* 180*   Lipid Profile: Recent Labs  02/09/19 1803 02/10/19 0032  TRIG 185* 144   Thyroid Function Tests: No results for input(s): TSH, T4TOTAL, FREET4, T3FREE, THYROIDAB in the last 72 hours. Anemia Panel: No results for input(s): VITAMINB12, FOLATE, FERRITIN, TIBC, IRON, RETICCTPCT in the last 72 hours. Urine analysis:    Component Value Date/Time   COLORURINE YELLOW 09/26/2015 1155   APPEARANCEUR CLEAR 09/26/2015 1155   LABSPEC 1.011 09/26/2015 1155   PHURINE 5.0 09/26/2015 1155   GLUCOSEU NEGATIVE 09/26/2015 1155   HGBUR SMALL (A) 09/26/2015 1155   BILIRUBINUR NEGATIVE 09/26/2015 1155   KETONESUR NEGATIVE 09/26/2015 1155   PROTEINUR 30 (A) 09/26/2015 1155   UROBILINOGEN 0.2 12/10/2014 0948   NITRITE NEGATIVE 09/26/2015 1155   LEUKOCYTESUR NEGATIVE 09/26/2015 1155   Sepsis Labs: @LABRCNTIP (procalcitonin:4,lacticidven:4)  ) Recent Results (from the past 240 hour(s))  SARS Coronavirus 2 (CEPHEID- Performed in [place] hospital lab), Hosp Order     Status: None   Collection Time: 02/09/19  5:45 PM   Specimen: Nasopharyngeal Swab  Result Value Ref Range Status   SARS Coronavirus 2 NEGATIVE NEGATIVE Final    Comment: (NOTE) If result is NEGATIVE SARS-CoV-2 target nucleic acids are NOT DETECTED. The SARS-CoV-2 RNA is generally detectable in upper and lower  respiratory specimens during the acute phase of infection. The lowest  concentration of SARS-CoV-2 viral copies this assay can detect is 250  copies / mL. A  negative result does not preclude SARS-CoV-2 infection  and should not be used as the sole basis for treatment or other  patient management decisions.  A negative result may occur with  improper specimen collection / handling, submission of specimen other  than nasopharyngeal swab, presence of viral mutation(s) within the  areas targeted by this assay, and inadequate number of viral copies  (<250 copies / mL). A negative result must be combined with clinical  observations, patient history, and epidemiological information. If result is POSITIVE SARS-CoV-2 target nucleic acids are DETECTED. The SARS-CoV-2 RNA is generally detectable in upper and lower  respiratory specimens dur ing the acute phase of infection.  Positive  results are indicative of active infection with SARS-CoV-2.  Clinical  correlation with patient history and other diagnostic information is  necessary to determine patient infection status.  Positive results do  not rule out bacterial infection or co-infection with other viruses. If result is PRESUMPTIVE POSTIVE SARS-CoV-2 nucleic acids MAY BE PRESENT.   A presumptive positive result was obtained on the submitted specimen  and confirmed on repeat testing.  While 2019 novel coronavirus  (SARS-CoV-2) nucleic acids may be present in the submitted sample  additional confirmatory testing may be necessary for epidemiological  and / or clinical management purposes  to differentiate between  SARS-CoV-2 and other Sarbecovirus currently known to infect humans.  If clinically indicated additional testing with an alternate test  methodology [phone]) is advised. The SARS-CoV-2 RNA is generally  detectable in upper and lower respiratory sp ecimens during the acute  phase of infection. The expected result is Negative. Fact Sheet for Patients:  StrictlyIdeas.no Fact Sheet for Healthcare Providers: BankingDealers.co.za This test is not  yet approved or cleared by the [place] FDA and has been authorized for detection and/or diagnosis of SARS-CoV-2 by FDA under an Emergency Use Authorization (EUA).  This EUA will remain in effect (meaning this test can be used) for the duration of the COVID-19 declaration under Section 564(b)(1) of the Act, 21 U.S.C. section 360bbb-3(b)(1), unless the authorization is terminated or revoked sooner. Performed at  [facility], [place] [address] [place], [place] [address]   MRSA PCR Screening     Status: None   Collection Time: 02/10/19 12:17 AM   Specimen: Nasopharyngeal  Result Value Ref Range Status   MRSA by PCR NEGATIVE NEGATIVE Final    Comment:        The GeneXpert MRSA Assay (FDA approved for NASAL specimens only), is one component of a comprehensive MRSA colonization surveillance program. It is not intended to diagnose MRSA infection nor to guide or monitor treatment for MRSA infections. Performed at [facility], [place] [address], [place], [place] [address]       Studies: No results found.  Scheduled Meds: . amiodarone  100 mg Oral Daily  . camphor-menthol   Topical TID  . carvedilol  3.125 mg Oral BID WC  . Chlorhexidine Gluconate Cloth  6 each Topical Daily  . feeding supplement (PRO-STAT SUGAR FREE 64)  30 mL Per Tube BID  . fluticasone furoate-vilanterol  1 puff Inhalation Daily  . folic acid  1 mg Oral Daily  . furosemide  20 mg Oral Daily  . umeclidinium bromide  1 puff Inhalation Daily  . [START ON 02/14/2019] warfarin  1.25 mg Oral Once per day on Tue Thu  . warfarin  2.5 mg Oral ONCE-1800  . Warfarin - Pharmacist Dosing Inpatient   Does not apply q1800    Continuous Infusions: . sodium chloride 250 mL (02/11/19 1610)  . cefTRIAXone (ROCEPHIN)  IV 1 g (02/11/19 1611)     LOS: 3 days     Kayleen Memos, MD [facility] Pager [phone]  If 7PM-7AM, please contact night-coverage [email or web address] Password [facility] 02/12/2019, 12:53  PM

## 2019-02-12 NOTE — Progress Notes (Signed)
ANTICOAGULATION CONSULT NOTE - Follow-Up Consult  Pharmacy Consult for Warfarin Indication: atrial fibrillation  Allergies  Allergen Reactions  . Codeine Shortness Of Breath and Rash    Patient Measurements: Height: 5\' 6"  (167.6 cm) Weight: 230 lb (104.3 kg)(scale b) IBW/kg (Calculated) : 63.8 Heparin Dosing Weight: 95kg  Vital Signs: Temp: 97.5 F (36.4 C) (07/26 0427) Temp Source: Oral (07/26 0427) BP: 107/68 (07/26 1033) Pulse Rate: 73 (07/26 1033)  Labs: Recent Labs    02/09/19 1805 02/09/19 1955  02/10/19 0032 02/10/19 0852 02/11/19 0446 02/12/19 0729  HGB  --   --    < > 14.7 14.6 14.0 13.9  HCT  --   --    < > 42.8 43.0 40.8 41.1  PLT  --   --   --  122*  --  131* 133*  LABPROT  --   --   --  26.2*  --  28.7* 26.5*  INR  --   --   --  2.4*  --  2.8* 2.5*  CREATININE  --   --   --  1.86*  --  2.06* 1.70*  TROPONINIHS 18* 45*  --   --   --   --   --    < > = values in this interval not displayed.    Estimated Creatinine Clearance: 45.1 mL/min (A) (by C-G formula based on SCr of 1.7 mg/dL (H)).   Assessment: 72 YOM on warfarin PTA for hx Afib - held on admit due to need to intubate. Now extubated and pharmacy consulted to resume dosing. The patient's INR on admission was 2.4 and at goal on PTA dose of 2.5 mg daily EXCEPT for 1.25 mg on Tues/Thurs. Last dose 7/23.   INR (2.5) remains therapeutic after resuming PTA dosing regimen. CBC stable. No bleeding noted.  Goal of Therapy:  INR 2-3 Monitor platelets by anticoagulation protocol: Yes   Plan:  - Warfarin 2.5 mg x1 tonight - Continue PTA Warfarin dosing of 2.5 mg daily EXCEPT for 1.25 mg on Tues/Thurs - Will continue to monitor for any signs/symptoms of bleeding and will follow up with PT/INR in the a.m.   Richardine Service, PharmD PGY1 Pharmacy Resident Phone: [phone] 02/12/2019  11:55 AM  Please check [email or web address] for unit-specific pharmacy phone numbers.

## 2019-02-13 ENCOUNTER — Inpatient Hospital Stay: Payer: Medicare Other | Admitting: Hematology and Oncology

## 2019-02-13 ENCOUNTER — Ambulatory Visit: Payer: Medicare Other

## 2019-02-13 ENCOUNTER — Inpatient Hospital Stay: Payer: Medicare Other

## 2019-02-13 ENCOUNTER — Telehealth: Payer: Self-pay | Admitting: Hematology and Oncology

## 2019-02-13 DIAGNOSIS — I428 Other cardiomyopathies: Secondary | ICD-10-CM

## 2019-02-13 LAB — BASIC METABOLIC PANEL
Anion gap: 13 (ref 5–15)
BUN: 43 mg/dL — ABNORMAL HIGH (ref 8–23)
CO2: 23 mmol/L (ref 22–32)
Calcium: 8.2 mg/dL — ABNORMAL LOW (ref 8.9–10.3)
Chloride: 102 mmol/L (ref 98–111)
Creatinine, Ser: 1.45 mg/dL — ABNORMAL HIGH (ref 0.61–1.24)
GFR calc Af Amer: 56 mL/min — ABNORMAL LOW (ref >60)
GFR calc non Af Amer: 48 mL/min — ABNORMAL LOW (ref >60)
Glucose, Bld: 104 mg/dL — ABNORMAL HIGH (ref 70–99)
Potassium: 4.1 mmol/L (ref 3.5–5.1)
Sodium: 138 mmol/L (ref 135–145)

## 2019-02-13 LAB — PROTIME-INR
INR: 2.5 — ABNORMAL HIGH (ref 0.8–1.2)
Prothrombin Time: 26.4 seconds — ABNORMAL HIGH (ref 11.4–15.2)

## 2019-02-13 MED ORDER — CEFDINIR 300 MG PO CAPS: 300.0000 mg | ORAL_CAPSULE | Freq: Two times a day (BID) | ORAL | 0 refills | Status: AC

## 2019-02-13 MED ORDER — ALBUTEROL SULFATE (2.5 MG/3ML) 0.083% IN NEBU: 2.5000 mg | INHALATION_SOLUTION | Freq: Two times a day (BID) | RESPIRATORY_TRACT | 0 refills | Status: DC | PRN

## 2019-02-13 MED ORDER — CEFDINIR 300 MG PO CAPS
300.0000 mg | ORAL_CAPSULE | Freq: Two times a day (BID) | ORAL | Status: DC
  Filled 2019-02-13 (×2): qty 1

## 2019-02-13 MED ORDER — HYDROXYUREA 500 MG PO CAPS
1000.0000 mg | ORAL_CAPSULE | Freq: Every day | ORAL | Status: DC
  Administered 2019-02-13: 1000 mg via ORAL
  Filled 2019-02-13: qty 2

## 2019-02-13 MED ORDER — CAMPHOR-MENTHOL 0.5-0.5 % EX LOTN: TOPICAL_LOTION | Freq: Three times a day (TID) | CUTANEOUS | 0 refills | Status: DC

## 2019-02-13 NOTE — TOC Transition Note (Signed)
Transition of Care [facility]) - CM/SW Discharge Note Marvetta Gibbons RN,BSN Transitions of Care Unit 3E - RN Case Manager [phone]   Patient Details  Name: [name] MRN: [id number] Date of Birth: 1945-09-21  Transition of Care [facility]) CM/SW Contact:  Dawayne Patricia, RN Phone Number: 02/13/2019, 11:24 AM   Clinical Narrative:    Pt stable for transition home today, orders placed for [facility] and DME needs- CM spoke with pt at bedside confirmed PCP and address info in epic. List provided to pt for [facility] agency choice Per CMS guidelines from medicare.gov website with star ratings (copy placed in shadow chart)- per pt he does not have a preference and ask CM to find him an agency that will provide services for [facility]- asked pt if Alvis Lemmings would be ok to reach out to - pt agreeable. Call made to [facility] with [facility]- referral has been accepted. Call also made to [facility] with [employer] for DME nebulizer need- nebulizer to be delivered to room prior to pt discharge.    Final next level of care: [place] Barriers to Discharge: No Barriers Identified   Patient Goals and CMS Choice Patient states their goals for this hospitalization and ongoing recovery are:: "to go home" CMS Medicare.gov Compare Post Acute Care list provided to:: Patient Choice offered to / list presented to : Patient  Discharge Placement  Home with [facility]                     Discharge Plan and Services   Discharge Planning Services: CM Consult Post Acute Care Choice: Durable Medical Equipment, Home Health          DME Arranged: Nebulizer machine DME Agency: [employer] Date DME Agency Contacted: 02/13/19 Time DME Agency Contacted: [phone] Representative spoke with at DME Agency: Thedore Mins HH Arranged: RN, PT, Nurse's Aide [place] Agency: [place] Date [place]: 02/13/19 Time [place]: [id number] Representative spoke with at [place]: [place] ([place])  Interventions     Readmission Risk Interventions Readmission Risk Prevention Plan 02/13/2019  Transportation Screening Complete  Medication Review Press photographer) Complete  PCP or Specialist appointment within 3-5 days of discharge Complete  HRI or [place] Complete  SW Recovery Care/Counseling Consult Complete  [place] Not Applicable  Some recent data might be hidden

## 2019-02-13 NOTE — Discharge Summary (Addendum)
Discharge Summary  [name] QPR:[id number] DOB: 1946/05/26  PCP: Ryan Payment, MD  Admit date: 02/09/2019 Discharge date: 02/13/2019  Time spent: 35 minutes  Recommendations for Outpatient Follow-up:  1. Follow-up with your cardiologist 2. Follow-up with your primary care provider 3. Take your medications as prescribed 4. Continue physical therapy 5. Fall precautions  Discharge Diagnoses:  Active Hospital Problems   Diagnosis Date Noted   Acute hypoxemic respiratory failure ([place]) 02/09/2019   Acute pulmonary edema ([place]) 06/05/2014    Resolved Hospital Problems  No resolved problems to display.    Discharge Condition: Stable  Diet recommendation: Heart healthy low-sodium diet  Vitals:   02/13/19 0747 02/13/19 0841  BP:  139/78  Pulse:  80  Resp:    Temp:  97.8 F (36.6 C)  SpO2: 95% 94%    History of present illness:   Patient is in acute metabolic encephalopathy and/or intubated. Therefore history has been obtained from chart review. 73 year old male with past medical history as below, which is significant for atrial fibrillation on warfarin, heart failure with reduced ejection fraction, COPD, and obesity. While sitting in the parking lot of [name] with his wife he developed respiratory distress. EMS was dispatched and upon their arrival his oxygen saturations were in the 60s requiring bag-valve-mask ventilations. Work of breathing was evident and he became fatigued upon arrival to the emergency department where he was intubated. Chest x-ray demonstrated bilateral infiltrates. PCCM was asked to admit.  Transferred to [facility] service on 02/11/19.  Extubated on 02/10/19.  02/13/19: Patient was seen and examined at his bedside.  No acute events overnight.  He denies chest pain, palpitations, dizziness, dyspnea, nausea, or abdominal pain.  He has no new complaints.  On the day of discharge, the patient was hemodynamically stable.  He will need to  follow-up with his cardiologist Dr. Lovena Hamilton and his primary care provider posthospitalization.  Patient understands and agrees to plan.  Hospital Course:  Active Problems:   Acute pulmonary edema (HCC)   Acute hypoxemic respiratory failure (HCC)  Resolving acute hypoxic respiratory failure likely multifactorial secondary to flash pulmonary edema versus community-acquired pneumonia, versus acute systolic CHF in the setting of previous chronic diastolic CHF, COPD. Post extubation on 02/10/2019 Continue to maintain O2 saturation greater than 92% Continue albuterol nebs twice daily as needed Abstain from tobacco use or secondhand smoking  Continue low-dose Lasix 20 mg daily Home O2 evaluation prior to discharge  Acute metabolic encephalopathy 2/2 to severe acute hypoxic respiratory failure requiring intubation  Resolved Management as stated above  Acute systolic CHF 2D echo done on 02/10/2019 showed LVEF 35 to 40% Continue Coreg, amiodarone, oral Lasix 20 mg daily Urine output net I&O -1.5L since admission Follow-up with your cardiologist Dr. Lovena Hamilton Per cardiology: 14 day Zio patch cardiac monitor at the time of discharge.   Resolving community-acquired pneumonia, POA Presented with leukocytosis with WBC 13 K, procalcitonin 1.30, and infiltrates on chest imaging. Procalcitonin 0.55 on 02/12/2019 from 1.12 on 02/11/2019 from 1.30 on 02/10/2019 Completed 4 days of Rocephin and IV Zithromax  Switched to cefdinir 300 mg twice daily x7 days on 02/13/2019 Continue bronchodilators and nebs  Resolving AKI on CKD 3 Creatinine 2.06 on 02/11/2019 from 1.86 with GFR of 36 Creatinine 02/12/2019 1.70 with GFR 40 Creatinine 02/13/2019  1.45 with GFR 48 Continue to avoid nephrotoxins Continue oral Lasix 20 mg daily Good urine output 1450 cc recorded in the last 24 hours Follow-up with your primary care provider  Acute  COPD exacerbation likely multifactorial secondary to above Not on O2 supplementation  at baseline Continue bronchodilators and nebulizer Maintain O2 saturation greater than 92% Home O2 evaluation prior to discharge  History of heroine abuse Continue Suboxone is being held  Suspected undiagnosed OSA Recommend polysomnography outpatient  Paroxysmal A. fib on Coumadin Rate is controlled INR therapeutic 2.5 on 02/13/2019 Continue Coumadin Follow-up with cardiology  Polycythemia vera Resume hydroxyurea Follow-up with Dr. Alvy Hamilton  Hypophosphatemia, repleted  Physical debility/ambulatory dysfunction PT recommended home health PT   Code status: Full.  Consultants:  Cardiology  Procedures:  Extubation on 02/10/2019  Antimicrobials:  Rocephin and azithromycin  DVT prophylaxis: Coumadin    Discharge Exam: BP 139/78    Pulse 80    Temp 97.8 F (36.6 C) (Oral)    Resp 18    Ht 5\' 6"  (1.676 m)    Wt 104 kg Comment: scale b   SpO2 94%    BMI 37.01 kg/m   General: 73 y.o. year-old male well developed well nourished in no acute distress.  Alert and oriented x3.  Cardiovascular: Regular rate and rhythm with no rubs or gallops.  No thyromegaly or JVD noted.    Respiratory: Clear to auscultation with no wheezes or rales. Good inspiratory effort.  Abdomen: Soft nontender nondistended with normal bowel sounds x4 quadrants.  Musculoskeletal: No lower extremity edema. 2/4 pulses in all 4 extremities.  Psychiatry: Mood is appropriate for condition and setting  Discharge Instructions You were cared for by a hospitalist during your hospital stay. If you have any questions about your discharge medications or the care you received while you were in the hospital after you are discharged, you can call the unit and asked to speak with the hospitalist on call if the hospitalist that took care of you is not available. Once you are discharged, your primary care physician will handle any further medical issues. Please note that NO REFILLS for any discharge  medications will be authorized once you are discharged, as it is imperative that you return to your primary care physician (or establish a relationship with a primary care physician if you do not have one) for your aftercare needs so that they can reassess your need for medications and monitor your lab values.   Allergies as of 02/13/2019      Reactions   Codeine Shortness Of Breath, Rash      Medication List    TAKE these medications   albuterol 108 (90 Base) MCG/ACT inhaler Commonly known as: ProAir HFA Inhale 1-2 puffs into the lungs every 6 (six) hours as needed for wheezing or shortness of breath. What changed: Another medication with the same name was added. Make sure you understand how and when to take each.   albuterol (2.5 MG/3ML) 0.083% nebulizer solution Commonly known as: PROVENTIL Take 3 mLs (2.5 mg total) by nebulization 2 (two) times daily as needed for wheezing or shortness of breath. What changed: You were already taking a medication with the same name, and this prescription was added. Make sure you understand how and when to take each.   amiodarone 200 MG tablet Commonly known as: PACERONE Take 0.5 tablets (100 mg total) by mouth daily.   camphor-menthol lotion Commonly known as: SARNA Apply topically 3 (three) times daily.   carvedilol 3.125 MG tablet Commonly known as: COREG Take 1 tablet (3.125 mg total) by mouth daily.   cefdinir 300 MG capsule Commonly known as: OMNICEF Take 1 capsule (300 mg total) by mouth  every 12 (twelve) hours for 7 days.   folic acid 1 MG tablet Commonly known as: FOLVITE Take 1 tablet (1 mg total) by mouth daily.   furosemide 20 MG tablet Commonly known as: LASIX Take 1 tablet (20 mg total) by mouth daily.   hydroxyurea 500 MG capsule Commonly known as: HYDREA Take 2 capsules (1,000 mg total) by mouth daily. MAY TAKE WITH FOOD TO MINIMIZE GI SIDE EFFECTS What changed: additional instructions   Suboxone 8-2 MG  Film Generic drug: Buprenorphine HCl-Naloxone HCl Place 1 Film under the tongue daily.   Trelegy Ellipta 100-62.5-25 MCG/INH Aepb Generic drug: Fluticasone-Umeclidin-Vilant Inhale 1 puff into the lungs daily.   warfarin 2.5 MG tablet Commonly known as: COUMADIN Take as directed. If you are unsure how to take this medication, talk to your nurse or doctor. Original instructions: TAKE 1/2 TABLET BY MOUTH ON TUESDAY AND FRIDAY. TAKE 1 TABLET DAILY ON THE OTHER DAYS What changed:   how much to take  how to take this  when to take this  additional instructions            Durable Medical Equipment  (From admission, onward)         Start     Ordered   02/13/19 0511  For home use only DME Nebulizer machine  Once    Question Answer Comment  Patient needs a nebulizer to treat with the following condition CAP (community acquired pneumonia)   Patient needs a nebulizer to treat with the following condition COPD (chronic obstructive pulmonary disease) ([place])   Length of Need 6 Months      02/13/19 0510         Allergies  Allergen Reactions   Codeine Shortness Of Breath and Rash   Follow-up Information    Ryan Payment, MD. Call in 1 day(s).   Specialty: Internal Medicine Why: Please call for a post hospital follow-up appointment. Contact information: [address] [place] [address] [phone]        Evans Lance, MD .   Specialty: Cardiology Contact information: [phone] [place] [address] [phone]        Heath Lark, MD. Call in 1 day(s).   Specialty: Hematology and Oncology Why: Please call for a post hospital follow-up appointment. Contact information: [place] [phone] [phone]            The results of significant diagnostics from this hospitalization (including imaging, microbiology, ancillary and laboratory) are listed below for reference.     Significant Diagnostic Studies: Dg Chest Port 1 View  Result Date: 02/11/2019 CLINICAL DATA:  Shortness of breath EXAM: PORTABLE CHEST 1 VIEW COMPARISON:  02/10/2019 and prior radiographs FINDINGS: Cardiomegaly again noted. Endotracheal tube and NG tube have been removed. Improved bilateral lung aeration with mild LEFT basilar atelectasis/airspace disease remaining. There may be very small bilateral pleural effusions present. No pneumothorax. IMPRESSION: Support apparatus removal with improved bilateral lung aeration. Electronically Signed   By: Margarette Canada M.D.   On: 02/11/2019 09:38   Dg Chest Port 1 View  Result Date: 02/10/2019 CLINICAL DATA:  Respiratory distress. EXAM: PORTABLE CHEST 1 VIEW COMPARISON:  02/09/2019 and 08/15/2018 FINDINGS: Enteric tube courses into the stomach and off the film as tip is not visualized. Endotracheal tube tip is somewhat difficult to identify but appears to be unchanged just above the origin of the right mainstem bronchus 2.5 cm superior to the carina. Lungs are  hypoinflated with mild worsening opacification over the left base which may be due to atelectasis and effusion versus infection. Interval improvement in prominence of the perihilar markings. Stable cardiomegaly. Remainder the exam is unchanged. IMPRESSION: Worsening left base opacification which may be due to effusion with atelectasis versus pneumonia. Moderate interval improvement with minimal residual vascular congestion. Stable cardiomegaly. Tubes and lines as described. Electronically Signed   By: Marin Olp M.D.   On: 02/10/2019 08:07   Dg Chest Portable 1 View  Result Date: 02/09/2019 CLINICAL DATA:  Endotracheal tube placement and orogastric tube placement. EXAM: PORTABLE CHEST 1 VIEW COMPARISON:  Chest radiograph performed 12/17/2018 FINDINGS: The patient's endotracheal tube is seen ending 2 cm above the carina. The enteric tube is seen extending below the diaphragm. Vascular congestion is noted.  Vague hazy opacities are noted bilaterally; given clinical concern, COVID-19 cannot be excluded. Pulmonary edema could conceivably have a similar appearance. No significant pleural effusion or pneumothorax is seen. The cardiomediastinal silhouette is mildly enlarged. No acute osseous abnormalities are identified. IMPRESSION: 1. Endotracheal tube seen ending 2 cm above the carina. 2. Enteric tube noted extending below the diaphragm. 3. Vague hazy airspace opacities noted bilaterally. Given clinical concern, COVID-19 cannot be excluded. Pulmonary edema could conceivably have a similar appearance. 4. Vascular congestion and mild cardiomegaly. Electronically Signed   By: Garald Balding M.D.   On: 02/09/2019 18:04    Microbiology: Recent Results (from the past 240 hour(s))  SARS Coronavirus 2 (CEPHEID- Performed in [place] hospital lab), Hosp Order     Status: None   Collection Time: 02/09/19  5:45 PM   Specimen: Nasopharyngeal Swab  Result Value Ref Range Status   SARS Coronavirus 2 NEGATIVE NEGATIVE Final    Comment: (NOTE) If result is NEGATIVE SARS-CoV-2 target nucleic acids are NOT DETECTED. The SARS-CoV-2 RNA is generally detectable in upper and lower  respiratory specimens during the acute phase of infection. The lowest  concentration of SARS-CoV-2 viral copies this assay can detect is 250  copies / mL. A negative result does not preclude SARS-CoV-2 infection  and should not be used as the sole basis for treatment or other  patient management decisions.  A negative result may occur with  improper specimen collection / handling, submission of specimen other  than nasopharyngeal swab, presence of viral mutation(s) within the  areas targeted by this assay, and inadequate number of viral copies  (<250 copies / mL). A negative result must be combined with clinical  observations, patient history, and epidemiological information. If result is POSITIVE SARS-CoV-2 target nucleic acids are  DETECTED. The SARS-CoV-2 RNA is generally detectable in upper and lower  respiratory specimens dur ing the acute phase of infection.  Positive  results are indicative of active infection with SARS-CoV-2.  Clinical  correlation with patient history and other diagnostic information is  necessary to determine patient infection status.  Positive results do  not rule out bacterial infection or co-infection with other viruses. If result is PRESUMPTIVE POSTIVE SARS-CoV-2 nucleic acids MAY BE PRESENT.   A presumptive positive result was obtained on the submitted specimen  and confirmed on repeat testing.  While 2019 novel coronavirus  (SARS-CoV-2) nucleic acids may be present in the submitted sample  additional confirmatory testing may be necessary for epidemiological  and / or clinical management purposes  to differentiate between  SARS-CoV-2 and other Sarbecovirus currently known to infect humans.  If clinically indicated additional testing with an alternate test  methodology [phone]) is advised.  The SARS-CoV-2 RNA is generally  detectable in upper and lower respiratory sp ecimens during the acute  phase of infection. The expected result is Negative. Fact Sheet for Patients:  StrictlyIdeas.no Fact Sheet for Healthcare Providers: BankingDealers.co.za This test is not yet approved or cleared by the [place] FDA and has been authorized for detection and/or diagnosis of SARS-CoV-2 by FDA under an Emergency Use Authorization (EUA).  This EUA will remain in effect (meaning this test can be used) for the duration of the COVID-19 declaration under Section 564(b)(1) of the Act, 21 U.S.C. section 360bbb-3(b)(1), unless the authorization is terminated or revoked sooner. Performed at [facility], [place] [address], [place], [place] [address]   MRSA PCR Screening     Status: None   Collection Time: 02/10/19 12:17 AM   Specimen: Nasopharyngeal   Result Value Ref Range Status   MRSA by PCR NEGATIVE NEGATIVE Final    Comment:        The GeneXpert MRSA Assay (FDA approved for NASAL specimens only), is one component of a comprehensive MRSA colonization surveillance program. It is not intended to diagnose MRSA infection nor to guide or monitor treatment for MRSA infections. Performed at [facility], [place] [address], [place], [place] [address]      Labs: Basic Metabolic Panel: Recent Labs  Lab 02/09/19 1740  02/10/19 0032 02/10/19 5638 02/10/19 0923 02/11/19 0446 02/12/19 0729 02/13/19 0451  NA 138   < > 141 142  --  137 137 138  K 3.9   < > 3.2* 3.7  --  4.8 4.3 4.1  CL 102  --  104  --   --  101 102 102  CO2 21*  --  25  --   --  26 26 23   GLUCOSE 217*  --  139*  --   --  140* 102* 104*  BUN 25*  --  26*  --   --  48* 52* 43*  CREATININE 1.89*  --  1.86*  --   --  2.06* 1.70* 1.45*  CALCIUM 7.7*  --  7.7*  --   --  8.3* 7.9* 8.2*  MG  --   --  1.6*  --  2.1  --   --   --   PHOS  --   --  3.6  --  2.2*  --   --   --    < > = values in this interval not displayed.   Liver Function Tests: Recent Labs  Lab 02/09/19 1740  AST 38  ALT 16  ALKPHOS 101  BILITOT 0.7  PROT 6.6  ALBUMIN 3.3*   No results for input(s): LIPASE, AMYLASE in the last 168 hours. No results for input(s): AMMONIA in the last 168 hours. CBC: Recent Labs  Lab 02/09/19 1740  02/09/19 2029 02/10/19 0032 02/10/19 0852 02/11/19 0446 02/12/19 0729  WBC 8.1  --   --  6.9  --  13.2* 8.3  NEUTROABS 5.5  --   --   --   --   --  6.5  HGB 15.9   < > 15.6 14.7 14.6 14.0 13.9  HCT 47.1   < > 46.0 42.8 43.0 40.8 41.1  MCV 123.3*  --   --  120.6*  --  120.7* 123.1*  PLT 188  --   --  122*  --  131* 133*   < > = values in this interval not displayed.   Cardiac Enzymes:  No results for input(s): CKTOTAL, CKMB, CKMBINDEX, TROPONINI in the last 168 hours. BNP: BNP (last 3 results) Recent Labs    08/15/18 1820 12/17/18 0808  02/09/19 1806  BNP 385.0* 694.5* 419.9*    ProBNP (last 3 results) No results for input(s): PROBNP in the last 8760 hours.  CBG: Recent Labs  Lab 02/09/19 2324 02/10/19 0351 02/10/19 0742 02/10/19 1137  GLUCAP 147* 149* 152* 180*       Signed:  Kayleen Memos, MD [facility] 02/13/2019, 11:11 AM

## 2019-02-13 NOTE — Telephone Encounter (Signed)
Unable to leave message/ scheduled appt per 7/27 sch message - mailed letter with appt date and time

## 2019-02-13 NOTE — Progress Notes (Signed)
Progress Note  Patient Name: [name] Date of Encounter: 02/13/2019  Primary Cardiologist:  Cristopher Peru, MD  Subjective   No complaints this am. No pain.   Inpatient Medications    Scheduled Meds:  amiodarone  100 mg Oral Daily   camphor-menthol   Topical TID   carvedilol  3.125 mg Oral BID WC   cefdinir  300 mg Oral Q12H   Chlorhexidine Gluconate Cloth  6 each Topical Daily   feeding supplement (PRO-STAT SUGAR FREE 64)  30 mL Per Tube BID   fluticasone furoate-vilanterol  1 puff Inhalation Daily   folic acid  1 mg Oral Daily   furosemide  20 mg Oral Daily   hydroxyurea  1,000 mg Oral Daily   umeclidinium bromide  1 puff Inhalation Daily   [START ON 02/14/2019] warfarin  1.25 mg Oral Once per day on Tue Thu   Warfarin - Pharmacist Dosing Inpatient   Does not apply q1800   Continuous Infusions:  sodium chloride 250 mL (02/11/19 1610)   PRN Meds: sodium chloride, ipratropium-albuterol   Vital Signs    Vitals:   02/13/19 0417 02/13/19 0746 02/13/19 0747 02/13/19 0841  BP: 123/75   139/78  Pulse: 84   80  Resp: 18     Temp: 98.6 F (37 C)   97.8 F (36.6 C)  TempSrc: Oral   Oral  SpO2: 98% 95% 95% 94%  Weight: 104 kg     Height:        Intake/Output Summary (Last 24 hours) at 02/13/2019 0906 Last data filed at 02/13/2019 0736 Gross per 24 hour  Intake 957.2 ml  Output 1651 ml  Net -693.8 ml   Last 3 Weights 02/13/2019 02/12/2019 02/11/2019  Weight (lbs) 229 lb 4.5 oz 230 lb 228 lb 12.8 oz  Weight (kg) 104 kg 104.327 kg 103.783 kg      Telemetry    Atrial fib, rate 80s - Personally Reviewed  ECG    No AM EKG- Personally Reviewed  Physical Exam   GEN: No acute distress.   Neck: No JVD Cardiac: Irreg irreg, no murmurs, rubs, or gallops.  Respiratory: Clear to auscultation bilaterally. GI: Soft, nontender, non-distended  Ext: Trace bilateral lower ext edema; No deformity. Neuro:  Nonfocal  Psych: Normal affect   Labs     High Sensitivity Troponin:   Recent Labs  Lab 02/09/19 1805 02/09/19 1955  TROPONINIHS 18* 45*      Cardiac EnzymesNo results for input(s): TROPONINI in the last 168 hours. No results for input(s): TROPIPOC in the last 168 hours.   Chemistry Recent Labs  Lab 02/09/19 1740  02/11/19 0446 02/12/19 0729 02/13/19 0451  NA 138   < > 137 137 138  K 3.9   < > 4.8 4.3 4.1  CL 102   < > 101 102 102  CO2 21*   < > 26 26 23   GLUCOSE 217*   < > 140* 102* 104*  BUN 25*   < > 48* 52* 43*  CREATININE 1.89*   < > 2.06* 1.70* 1.45*  CALCIUM 7.7*   < > 8.3* 7.9* 8.2*  PROT 6.6  --   --   --   --   ALBUMIN 3.3*  --   --   --   --   AST 38  --   --   --   --   ALT 16  --   --   --   --  ALKPHOS 101  --   --   --   --   BILITOT 0.7  --   --   --   --   GFRNONAA 35*   < > 31* 40* 48*  GFRAA 40*   < > 36* 46* 56*  ANIONGAP 15   < > 10 9 13    < > = values in this interval not displayed.     Hematology Recent Labs  Lab 02/10/19 0032 02/10/19 0852 02/11/19 0446 02/12/19 0729  WBC 6.9  --  13.2* 8.3  RBC 3.55*  --  3.38* 3.34*  HGB 14.7 14.6 14.0 13.9  HCT 42.8 43.0 40.8 41.1  MCV 120.6*  --  120.7* 123.1*  MCH 41.4*  --  41.4* 41.6*  MCHC 34.3  --  34.3 33.8  RDW 13.3  --  13.5 13.5  PLT 122*  --  131* 133*    BNP Recent Labs  Lab 02/09/19 1806  BNP 419.9*     DDimer No results for input(s): DDIMER in the last 168 hours.   Radiology    No results found.  Cardiac Studies   Echo 02/10/19: Echocardiogram 02/10/2019: 1. The left ventricle has moderately reduced systolic function, with an ejection fraction of 35-40%. The cavity size was moderately dilated. There is mild concentric left ventricular hypertrophy. Left ventricular diastolic function could not be  evaluated secondary to atrial fibrillation. Left ventricular diffuse hypokinesis. 2. The right ventricle has normal systolic function. The cavity was normal. There is no increase in right ventricular wall  thickness. Right ventricular systolic pressure is normal with an estimated pressure of 26.7 mmHg. 3. Large pleural effusion in the left lateral region. 4. Small pericardial effusion. 5. The pericardial effusion is posterior to the left ventricle. 6. Mild thickening of the mitral valve leaflet. 7. The aortic valve is tricuspid. Aortic valve regurgitation is moderate by color flow Doppler. 8. The aorta is abnormal in size and structure.  Patient Profile     73 y.o. male with persistent atrial fibrillation and prior atrial flutter ablation, known cardiomyopathy and HTN who is followed in our office by Dr. Lovena Le. He was admitted with acute respiratory failure and was treated for pneumonia and COPD exacerbation. Echo with LVEF 35-40% which is similar to his LVEF in 2014.   Assessment & Plan    1. Persistent atrial fibrillation: Rate is controlled. Continue beta blocker, Coumadin and amiodarone. Per recs of Dr. Domenic Polite yesterday, we will arrange a 14 day Zio patch cardiac monitor at time of discharge to investigate heart rate variability and other potential arrhythmias that could have contributed to his presentation. We will arrange this. These results will be sent to Dr. Lovena Le. He will need outpatient follow up with Dr. Lovena Le.   2. Non-ischemic cardiomyopathy: No evidence of ACS. His LVEF is similar to prior LVEF in 2014. Continue Coreg. Not a candidate for Ace inh/ARB/aldactone due to stage 3 CKD.    [facility] will sign off.   Medication Recommendations:  Continue current meds Other recommendations (labs, testing, etc):  We will arrange a 14 day Zio patch cardiac monitor at the time of discharge.  Follow up as an outpatient:  We will arrange f/u with Dr. Lovena Le after his cardiac monitor is complete.   For questions or updates, please contact [place] Please consult [email or web address] for contact info under        Signed, [name], MD  02/13/2019, 9:06 AM

## 2019-02-13 NOTE — Care Management Important Message (Signed)
Important Message  Patient Details  Name: [name] MRN: [id number] Date of Birth: [birth year]   Medicare Important Message Given:  Yes     [name] 02/13/2019, 1:22 PM

## 2019-02-13 NOTE — Progress Notes (Signed)
Physical Therapy Treatment/ Discharge Patient Details Name: [name] MRN: [id number] DOB: July 03, 1946 Today's Date: 02/13/2019    History of Present Illness Patient is a 73 year old male admited on 02/09/2019 with repiratory failure and subsequent intubation 7/23-7/24.PMHx:syncope, subtance abuse, CHF, renal insuffciency, COPD, a-fib, gout, HTN    PT Comments    Pt pleasant in chair on arrival and reports feeling better without Sob. Pt normally can walk in home and community without assist and able to perform baseline mobility this session with SpO2 maintained at 96% on RA. Pt educated for walking program and encouraged to perform 3x/day at home and add 1 min as able. Pt verbalized understanding and demonstrated mobility appropriate for return home. No further therapy needs at this time with pt aware and agreeable.    Follow Up Recommendations  No PT follow up     Equipment Recommendations  None recommended by PT    Recommendations for Other Services       Precautions / Restrictions Precautions Precautions: None Restrictions Weight Bearing Restrictions: No    Mobility  Bed Mobility               General bed mobility comments: in chair on arrival and end of session  Transfers Overall transfer level: Independent               General transfer comment: pt stood from chair and bed  Ambulation/Gait Ambulation/Gait assistance: Modified independent (Device/Increase time) Gait Distance (Feet): 400 Feet Assistive device: None Gait Pattern/deviations: WFL(Within Functional Limits)   Gait velocity interpretation: >2.62 ft/sec, indicative of community ambulatory General Gait Details: Pt with slow steady pace with SPO2 maintained at 96% on RA with gait   Stairs Stairs: Yes Stairs assistance: Modified independent (Device/Increase time) Stair Management: One rail Right;Alternating pattern;Forwards Number of Stairs: 3 General stair comments: pt  performed stairs safely with use of rail   Wheelchair Mobility    Modified Rankin (Stroke Patients Only)       Balance Overall balance assessment: No apparent balance deficits (not formally assessed)                                          Cognition Arousal/Alertness: Awake/alert Behavior During Therapy: WFL for tasks assessed/performed Overall Cognitive Status: Within Functional Limits for tasks assessed                                        Exercises      General Comments        Pertinent Vitals/Pain Pain Assessment: No/denies pain    Home Living                      Prior Function            PT Goals (current goals can now be found in the care plan section) Progress towards PT goals: Goals met/education completed, patient discharged from PT    Frequency           PT Plan Discharge plan needs to be updated    Co-evaluation              AM-PAC PT "6 Clicks" Mobility   Outcome Measure  Help needed turning from your back to your side while in a flat bed  without using bedrails?: None Help needed moving from lying on your back to sitting on the side of a flat bed without using bedrails?: None Help needed moving to and from a bed to a chair (including a wheelchair)?: None Help needed standing up from a chair using your arms (e.g., wheelchair or bedside chair)?: None Help needed to walk in hospital room?: None Help needed climbing 3-5 steps with a railing? : None 6 Click Score: 24    End of Session   Activity Tolerance: Patient tolerated treatment well Patient left: in chair;with call bell/phone within reach Nurse Communication: Mobility status       Time: 1937-9024 PT Time Calculation (min) (ACUTE ONLY): 12 min  Charges:  $Gait Training: 8-22 mins                     [place] Pager: [phone] Office: [place] 02/13/2019, 11:58 AM

## 2019-02-13 NOTE — Progress Notes (Signed)
ANTICOAGULATION CONSULT NOTE  Pharmacy Consult:  Coumadin Indication: atrial fibrillation  Allergies  Allergen Reactions  . Codeine Shortness Of Breath and Rash    Patient Measurements: Height: 5\' 6"  (167.6 cm) Weight: 229 lb 4.5 oz (104 kg)(scale b) IBW/kg (Calculated) : 63.8  Vital Signs: Temp: 97.8 F (36.6 C) (07/27 0841) Temp Source: Oral (07/27 0841) BP: 139/78 (07/27 0841) Pulse Rate: 80 (07/27 0841)  Labs: Recent Labs    02/11/19 0446 02/12/19 0729 02/13/19 0451  HGB 14.0 13.9  --   HCT 40.8 41.1  --   PLT 131* 133*  --   LABPROT 28.7* 26.5* 26.4*  INR 2.8* 2.5* 2.5*  CREATININE 2.06* 1.70* 1.45*    Estimated Creatinine Clearance: 52.8 mL/min (A) (by C-G formula based on SCr of 1.45 mg/dL (H)).   Assessment: 64 YOM continues on Coumadin from PTA for history of AFib.  INR remains therapeutic; no bleeding reported..  Goal of Therapy:  INR 2-3 Monitor platelets by anticoagulation protocol: Yes   Plan:  Continue Coumadin 2.5mg  PO daily except 1.25mg  on Tues/Thurs Daily PT / INR  Adelita Hone D. Mina Marble, PharmD, BCPS, [place] 02/13/2019, 1:04 PM

## 2019-02-14 ENCOUNTER — Other Ambulatory Visit: Payer: Self-pay | Admitting: *Deleted

## 2019-02-14 ENCOUNTER — Telehealth: Payer: Self-pay | Admitting: *Deleted

## 2019-02-14 DIAGNOSIS — I480 Paroxysmal atrial fibrillation: Secondary | ICD-10-CM

## 2019-02-14 DIAGNOSIS — I5043 Acute on chronic combined systolic (congestive) and diastolic (congestive) heart failure: Secondary | ICD-10-CM

## 2019-02-14 NOTE — Telephone Encounter (Signed)
14 day ZIO AT long term live telemetry monitor to be mailed to patient home.  Instructions reviewed briefly as they are included in the monitor kit.

## 2019-02-15 ENCOUNTER — Telehealth: Payer: Self-pay | Admitting: *Deleted

## 2019-02-15 NOTE — Telephone Encounter (Signed)
That is good from [place] if it works for Dr. Elie Confer

## 2019-02-15 NOTE — Telephone Encounter (Signed)
[facility] calls to check on need of next INR HFU appt mon 8/3 at 1545 [facility] Coumadin clinic appt 8/3 at 1515 Is this agreeable? Sending to dr's coe, groce and attending

## 2019-02-17 NOTE — Telephone Encounter (Signed)
YES. I can see him on Monday 20-Feb-2019 at 1515h.

## 2019-02-20 ENCOUNTER — Ambulatory Visit (INDEPENDENT_AMBULATORY_CARE_PROVIDER_SITE_OTHER): Payer: Medicare Other | Admitting: Internal Medicine

## 2019-02-20 ENCOUNTER — Other Ambulatory Visit: Payer: Self-pay

## 2019-02-20 ENCOUNTER — Ambulatory Visit (INDEPENDENT_AMBULATORY_CARE_PROVIDER_SITE_OTHER): Payer: Medicare Other | Admitting: Pharmacist

## 2019-02-20 ENCOUNTER — Encounter: Payer: Self-pay | Admitting: Internal Medicine

## 2019-02-20 VITALS — BP 103/57 | HR 108 | Temp 98.7°F | Ht 66.0 in | Wt 225.7 lb

## 2019-02-20 DIAGNOSIS — Z5181 Encounter for therapeutic drug level monitoring: Secondary | ICD-10-CM | POA: Diagnosis not present

## 2019-02-20 DIAGNOSIS — I5043 Acute on chronic combined systolic (congestive) and diastolic (congestive) heart failure: Secondary | ICD-10-CM | POA: Diagnosis present

## 2019-02-20 DIAGNOSIS — Z7901 Long term (current) use of anticoagulants: Secondary | ICD-10-CM | POA: Diagnosis not present

## 2019-02-20 DIAGNOSIS — I4811 Longstanding persistent atrial fibrillation: Secondary | ICD-10-CM

## 2019-02-20 DIAGNOSIS — I4891 Unspecified atrial fibrillation: Secondary | ICD-10-CM | POA: Diagnosis not present

## 2019-02-20 DIAGNOSIS — J449 Chronic obstructive pulmonary disease, unspecified: Secondary | ICD-10-CM | POA: Diagnosis not present

## 2019-02-20 LAB — POCT INR: INR: 2.9 (ref 2.0–3.0)

## 2019-02-20 NOTE — Progress Notes (Signed)
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 male with PMH below.  Today we will address HFU acute respiratory failure, combined systolic and diastolic CHF, COPD  Please see A&P for status of the patient's chronic medical conditions  Past Medical History:  Diagnosis Date  . Acute lower GI bleeding 02/15/2017  . Atrial fibrillation ([place])   . Cardiomyopathy    EF55% 11/14<<35%   . CHF (congestive heart failure) ([place])   . COPD (chronic obstructive pulmonary disease) ([place])   . Essential hypertension   . GERD (gastroesophageal reflux disease)   . Gout   . Hepatitis    Possible history  . History of atrial flutter    Ablation 2012  . 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 Obesity   . Pneumonia X 1  . Primary polycythemia ([place]) 06/12/2013  . Renal insufficiency   . Substance abuse ([place])    History of alcohol; hx cocaine, heroin, crack use  . Syncope    Review of Systems:  ROS: Pulmonary: pt denies increased work of breathing, shortness of breath has improved  Cardiac: pt denies palpitations, chest pain,  Abdominal: pt denies abdominal pain, nausea, vomiting, or diarrhea   Physical Exam:  Vitals:   02/20/19 1510 02/20/19 1511  BP:  (!) 103/57  Pulse:  (!) 108  Temp:  98.7 F (37.1 C)  TempSrc:  Oral  SpO2:  99%  Weight: 225 lb 11.2 oz (102.4 kg)   Height: 5\' 6"  (1.676 m)    Cardiac: JVD 10, distant heart sounds, 1+ LE edema bilaterally Pulmonary: CTAB, not in distress Abdominal: non distended abdomen, soft and nontender Psych: Alert, conversant, in good spirits   Social History   Socioeconomic History  . Marital status: Single    Spouse name: Not on file  . Number of children: Not on file  . Years of education: Not on file  . Highest education level: Not on file  Occupational History  . Occupation: Retired  Scientific laboratory technician  . Financial  resource strain: Not on file  . Food insecurity    Worry: Not on file    Inability: Not on file  . Transportation needs    Medical: Not on file    Non-medical: Not on file  Tobacco Use  . Smoking status: Current Every Day Smoker    Packs/day: 0.10    Years: 50.00    Pack years: 5.00    Types: Cigarettes  . Smokeless tobacco: Never Used  . Tobacco comment: 2-3 cigs  Substance and Sexual Activity  . Alcohol use: Yes    Alcohol/week: 89.0 standard drinks    Types: 12 Cans of beer, 77 Shots of liquor per week    Comment: Sometimes.  . Drug use: Not Currently    Types: "Crack" cocaine, Cocaine, Heroin    Comment: 02/15/2017 "last used crack 4-5 years ago; last used heroin a couple years ago; currently denies any drug use-- on suboxone.   . Sexual activity: Not Currently  Lifestyle  . Physical activity    Days per week: Not on file    Minutes per session: Not on file  . Stress: Not on file  Relationships  . Social Herbalist on phone: Not on file    Gets together: Not on file    Attends religious service: Not on file    Active member of club or organization: Not  on file    Attends meetings of clubs or organizations: Not on file    Relationship status: Not on file  . Intimate partner violence    Fear of current or ex partner: Not on file    Emotionally abused: Not on file    Physically abused: Not on file    Forced sexual activity: Not on file  Other Topics Concern  . Not on file  Social History Narrative   Moved here from [place]. Lives with common law wife and grandchildren. He is retired from "general labor."    Family History  Problem Relation Age of Onset  . Diabetes Mother   . Hypertension Mother   . Cirrhosis Father   . Alcohol abuse Father   . Colon cancer Neg Hx   . Rectal cancer Neg Hx   . Stomach cancer Neg Hx     Assessment & Plan:   See Encounters Tab for problem based charting.  Patient discussed with Dr. Dareen Piano

## 2019-02-20 NOTE — Patient Instructions (Signed)
Patient instructed to take medications as defined in the Anti-coagulation Track section of this encounter.  Patient instructed to take today's dose.  Patient instructed to take one (1) tablet of your 2.5mg  green-colored warfarin tablets every day of the week, except take only one-half (1/2) tablet on TUESDAYS and FRIDAYS. Repeat this regimen weekly. Patient verbalized understanding of these instructions.

## 2019-02-20 NOTE — Progress Notes (Signed)
Anticoagulation Management [name] is a 73 y.o. male who reports to the clinic for monitoring of warfarin treatment.    Indication: atrial fibrillation; Long term current use of anticoagulant.   Duration: indefinite Supervising physician: Aldine Contes  Anticoagulation Clinic Visit History: Patient does not report signs/symptoms of bleeding or thromboembolism  Other recent changes: No diet, medications, lifestyle changes endorsed.  Anticoagulation Episode Summary    Current INR goal:  2.0-3.0  TTR:  67.5 % (6.2 y)  Next INR check:  03/20/2019  INR from last check:  2.9 (02/20/2019)  Weekly max warfarin dose:    Target end date:  Indefinite  INR check location:  Anticoagulation Clinic  Preferred lab:    Send INR reminders to:     Indications   Atrial fibrillation ([place]) [I48.91]       Comments:          Allergies  Allergen Reactions  . Codeine Shortness Of Breath and Rash    Current Outpatient Medications:  .  albuterol (PROAIR HFA) 108 (90 Base) MCG/ACT inhaler, Inhale 1-2 puffs into the lungs every 6 (six) hours as needed for wheezing or shortness of breath., Disp: 18 g, Rfl: 3 .  albuterol (PROVENTIL) (2.5 MG/3ML) 0.083% nebulizer solution, Take 3 mLs (2.5 mg total) by nebulization 2 (two) times daily as needed for wheezing or shortness of breath., Disp: 75 mL, Rfl: 0 .  amiodarone (PACERONE) 200 MG tablet, Take 0.5 tablets (100 mg total) by mouth daily., Disp: 45 tablet, Rfl: 3 .  camphor-menthol (SARNA) lotion, Apply topically 3 (three) times daily., Disp: 222 mL, Rfl: 0 .  carvedilol (COREG) 3.125 MG tablet, Take 1 tablet (3.125 mg total) by mouth daily., Disp: 90 tablet, Rfl: 2 .  cefdinir (OMNICEF) 300 MG capsule, Take 1 capsule (300 mg total) by mouth every 12 (twelve) hours for 7 days., Disp: 14 capsule, Rfl: 0 .  Fluticasone-Umeclidin-Vilant (TRELEGY ELLIPTA) 100-62.5-25 MCG/INH AEPB, Inhale 1 puff into the lungs daily., Disp: 60 each, Rfl: 5 .   folic acid (FOLVITE) 1 MG tablet, Take 1 tablet (1 mg total) by mouth daily., Disp: 90 tablet, Rfl: 3 .  hydroxyurea (HYDREA) 500 MG capsule, Take 2 capsules (1,000 mg total) by mouth daily. MAY TAKE WITH FOOD TO MINIMIZE GI SIDE EFFECTS (Patient taking differently: Take 1,000 mg by mouth daily. ), Disp: 180 capsule, Rfl: 3 .  SUBOXONE 8-2 MG FILM, Place 1 Film under the tongue daily. , Disp: , Rfl:  .  warfarin (COUMADIN) 2.5 MG tablet, TAKE 1/2 TABLET BY MOUTH ON TUESDAY AND FRIDAY. TAKE 1 TABLET DAILY ON THE OTHER DAYS (Patient taking differently: Take 1.25-2.5 mg by mouth See admin instructions. Per 12/27/18 visit anticoagulant dose Take 1.25 mg on Tuesday and Thursday 2.5 mg all the other days), Disp: 30 tablet, Rfl: 2 .  furosemide (LASIX) 20 MG tablet, Take 1 tablet (20 mg total) by mouth daily., Disp: 90 tablet, Rfl: 3 Past Medical History:  Diagnosis Date  . Acute lower GI bleeding 02/15/2017  . Atrial fibrillation ([place])   . Cardiomyopathy    EF55% 11/14<<35%   . CHF (congestive heart failure) ([place])   . COPD (chronic obstructive pulmonary disease) ([place])   . Essential hypertension   . GERD (gastroesophageal reflux disease)   . Gout   . Hepatitis    Possible history  . History of atrial flutter    Ablation 2012  . 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 Obesity   . Pneumonia X 1  .  Primary polycythemia ([place]) 06/12/2013  . Renal insufficiency   . Substance abuse ([place])    History of alcohol; hx cocaine, heroin, crack use  . Syncope    Social History   Socioeconomic History  . Marital status: Single    Spouse name: Not on file  . Number of children: Not on file  . Years of education: Not on file  . Highest education level: Not on file  Occupational History  . Occupation: Retired  Scientific laboratory technician  . Financial resource strain: Not on file  . Food insecurity    Worry: Not on file    Inability: Not on file  . Transportation needs    Medical: Not on file     Non-medical: Not on file  Tobacco Use  . Smoking status: Current Every Day Smoker    Packs/day: 0.10    Years: 50.00    Pack years: 5.00    Types: Cigarettes  . Smokeless tobacco: Never Used  . Tobacco comment: 2-3 cigs  Substance and Sexual Activity  . Alcohol use: Yes    Alcohol/week: 89.0 standard drinks    Types: 12 Cans of beer, 77 Shots of liquor per week    Comment: Sometimes.  . Drug use: Not Currently    Types: "Crack" cocaine, Cocaine, Heroin    Comment: 02/15/2017 "last used crack 4-5 years ago; last used heroin a couple years ago; currently denies any drug use-- on suboxone.   . Sexual activity: Not Currently  Lifestyle  . Physical activity    Days per week: Not on file    Minutes per session: Not on file  . Stress: Not on file  Relationships  . Social Herbalist on phone: Not on file    Gets together: Not on file    Attends religious service: Not on file    Active member of club or organization: Not on file    Attends meetings of clubs or organizations: Not on file    Relationship status: Not on file  Other Topics Concern  . Not on file  Social History Narrative   Moved here from [place]. Lives with common law wife and grandchildren. He is retired from "general labor."   Family History  Problem Relation Age of Onset  . Diabetes Mother   . Hypertension Mother   . Cirrhosis Father   . Alcohol abuse Father   . Colon cancer Neg Hx   . Rectal cancer Neg Hx   . Stomach cancer Neg Hx     ASSESSMENT Recent Results: The most recent result is correlated with 15 mg per week: Lab Results  Component Value Date   INR 2.9 02/20/2019   INR 2.5 (H) 02/13/2019   INR 2.5 (H) 02/12/2019    Anticoagulation Dosing: Description   Take one (1) tablet of your 2.5mg  green-colored warfarin tablets every day of the week, except take only one-half (1/2) tablet on TUESDAYS and FRIDAYS. Repeat this regimen weekly.     INR today: Therapeutic  PLAN Weekly dose  was unchanged.   Patient Instructions  Patient instructed to take medications as defined in the Anti-coagulation Track section of this encounter.  Patient instructed to take today's dose.  Patient instructed to take one (1) tablet of your 2.5mg  green-colored warfarin tablets every day of the week, except take only one-half (1/2) tablet on TUESDAYS and FRIDAYS. Repeat this regimen weekly. Patient verbalized understanding of these instructions.     Patient advised to contact clinic or  seek medical attention if signs/symptoms of bleeding or thromboembolism occur.  Patient verbalized understanding by repeating back information and was advised to contact me if further medication-related questions arise. Patient was also provided an information handout.  Follow-up Return in 4 weeks (on 03/20/2019) for Follow up INR.  Pennie Banter, PharmD, CPP  15 minutes spent face-to-face with the patient during the encounter. 50% of time spent on education, including signs/sx bleeding and clotting, as well as food and drug interactions with warfarin. 50% of time was spent on fingerprick POC INR sample collection,processing, results determination, and documentation in http://www.kim.net/.

## 2019-02-20 NOTE — Patient Instructions (Addendum)
[name], We will have you take an extra dose of lasix for the next two days. We will check your lab work today.  Because your bp is very low we will not be able to add other therapies at this time.  We are working to get you a follow up appointment with cardiology. Please follow the instructions below:    1. Follow a low-salt diet - you are allowed no more than 2,000mg  of sodium per day. Watch your fluid intake. In general, you should not be taking in more than 2 liters of fluid per day (no more than 8 glasses per day). This includes sources of water in foods like soup, coffee, tea, milk, etc. 2. Weigh yourself on the same scale at same time of day and keep a log. 3. Call your doctor: (Anytime you feel any of the following symptoms)  - 3lb weight gain overnight or 5lb within a few days - Shortness of breath, with or without a dry hacking cough  - Swelling in the hands, feet or stomach  - If you have to sleep on extra pillows at night in order to breathe   IT IS IMPORTANT TO LET YOUR DOCTOR KNOW EARLY ON IF YOU ARE HAVING SYMPTOMS SO WE CAN HELP YOU!

## 2019-02-21 ENCOUNTER — Telehealth: Payer: Self-pay | Admitting: *Deleted

## 2019-02-21 LAB — BMP8+ANION GAP
Anion Gap: 16 mmol/L (ref 10.0–18.0)
BUN/Creatinine Ratio: 13 (ref 10–24)
BUN: 19 mg/dL (ref 8–27)
CO2: 24 mmol/L (ref 20–29)
Calcium: 8.5 mg/dL — ABNORMAL LOW (ref 8.6–10.2)
Chloride: 103 mmol/L (ref 96–106)
Creatinine, Ser: 1.47 mg/dL — ABNORMAL HIGH (ref 0.76–1.27)
GFR calc Af Amer: 55 mL/min/{1.73_m2} — ABNORMAL LOW (ref >59)
GFR calc non Af Amer: 47 mL/min/{1.73_m2} — ABNORMAL LOW (ref >59)
Glucose: 94 mg/dL (ref 65–99)
Potassium: 4.2 mmol/L (ref 3.5–5.2)
Sodium: 143 mmol/L (ref 134–144)

## 2019-02-21 NOTE — Telephone Encounter (Signed)
Patient given first available appt with Dr. Hillery Jacks PA, Tommye Standard on 03/14/2019 at [place]. Attempted to notify patient. No answer and no VM set up. Patient will also need f/u in [facility] 8/10-8/13/2020. Will route to [employer] for assistance in notifying patient of cardiology appt and scheduling ACC appt. Hubbard Hartshorn, RN, BSN

## 2019-02-21 NOTE — Progress Notes (Signed)
INTERNAL MEDICINE TEACHING ATTENDING ADDENDUM - Keygan Dumond M.D  Duration- indefinite, Indication- afib, INR- therapeutic. Agree with pharmacy recommendations as outlined in their note.     

## 2019-02-22 NOTE — Telephone Encounter (Signed)
Spoke with the pt and notified him of the Cardiology appt and sch an [facility] Appt for 02/27/2019.

## 2019-02-22 NOTE — Assessment & Plan Note (Addendum)
No scale at home but discussed the importance of daily weights and he will get one.  Discussed importance of low sodium diet and quitting smoking. He took carvedilol this am.  Feels he is breathing better since d/c from hospital. He can walk to the mailbox and back before he gets short of breath and has to take a break.  Not keeping track of fluid intake has had some increased edema in bilateral lower extremities. Weight is +/- 1lb from discharge weight by our scales.  BP remains low and a barrier to additional chf meds.    -He will double diuretic dose for two days then return to maintenance dose -Arranged a cardiology follow up appointment -he will follow up in acc so we can follow his progress in one week -check bmp today

## 2019-02-22 NOTE — Assessment & Plan Note (Addendum)
Breathing overall is "okay".  Has nebulizer machine at home uses it about once daily as needed.  Takes Trelegy daily.   Still smoking a few cigs per day.  Not interested in chantix or other smoking cessation assistance.  Not wheezing on exam today, good air movement.  No cough, fevers or chills.    -continue Trelegy and PRN nebs

## 2019-02-23 ENCOUNTER — Telehealth: Payer: Self-pay | Admitting: *Deleted

## 2019-02-23 ENCOUNTER — Telehealth: Payer: Self-pay | Admitting: Internal Medicine

## 2019-02-23 NOTE — Telephone Encounter (Signed)
New Message   Zach from [employer] is calling to inform the office that the patient has not put on the monitor as of yet and they have not been able to get in touch with the patient.

## 2019-02-23 NOTE — Telephone Encounter (Signed)
[name], PT [facility] calls for home orders,  1x week for 1 week 2x week for 2 weeks For balance, safety, mobility in home VO given, do you agree?

## 2019-02-24 ENCOUNTER — Telehealth: Payer: Self-pay | Admitting: Internal Medicine

## 2019-02-24 ENCOUNTER — Ambulatory Visit (INDEPENDENT_AMBULATORY_CARE_PROVIDER_SITE_OTHER): Payer: Medicare Other

## 2019-02-24 DIAGNOSIS — I480 Paroxysmal atrial fibrillation: Secondary | ICD-10-CM | POA: Diagnosis not present

## 2019-02-24 DIAGNOSIS — I5043 Acute on chronic combined systolic (congestive) and diastolic (congestive) heart failure: Secondary | ICD-10-CM

## 2019-02-24 NOTE — Telephone Encounter (Signed)
  [employer] is calling to make [place] aware that patient has had monitor for over 72 hours and has not put it on yet

## 2019-02-24 NOTE — Progress Notes (Signed)
Internal Medicine Clinic Attending  Case discussed with Dr. Winfrey  at the time of the visit.  We reviewed the resident's history and exam and pertinent patient test results.  I agree with the assessment, diagnosis, and plan of care documented in the resident's note.  

## 2019-02-24 NOTE — Addendum Note (Signed)
Addended by: Aldine Contes on: 02/24/2019 10:11 AM   Modules accepted: Level of Service

## 2019-02-25 ENCOUNTER — Telehealth: Payer: Self-pay | Admitting: Student

## 2019-02-25 NOTE — Telephone Encounter (Signed)
   Received page from [facility] with [facility] that received alert that patient was in atrial fibrillation with rates in the 70's to 100's. Patient reportedly asymptomatic. Patient has known persistent atrial fibrillation and monitor was recently ordered to investigate heart rate variability and other potential arrhythmias. Per note in chart yesterday, "patient has had monitor for over 72 hours and has not put it on yet." Called and spoke with patient's daughter (patient was sleeping). Daughter reported that patient just put monitor on this morning. He is asymptomatic. Advised to continue to wear monitor as directed.  Darreld Mclean, PA-C 02/25/2019 11:37 AM

## 2019-02-27 ENCOUNTER — Other Ambulatory Visit: Payer: Self-pay

## 2019-02-27 ENCOUNTER — Ambulatory Visit (INDEPENDENT_AMBULATORY_CARE_PROVIDER_SITE_OTHER): Payer: Medicare Other | Admitting: Internal Medicine

## 2019-02-27 VITALS — BP 101/54 | HR 70 | Temp 97.9°F | Ht 66.0 in | Wt 225.8 lb

## 2019-02-27 DIAGNOSIS — I5043 Acute on chronic combined systolic (congestive) and diastolic (congestive) heart failure: Secondary | ICD-10-CM

## 2019-02-27 DIAGNOSIS — Z79899 Other long term (current) drug therapy: Secondary | ICD-10-CM | POA: Diagnosis not present

## 2019-02-27 NOTE — Telephone Encounter (Signed)
Monitor was activated 02/24/2019.

## 2019-02-27 NOTE — Patient Instructions (Addendum)
Today's visit:  It was a pleasure to see you today. Here is a summary of the items we talked about:  1. Start taking two tablets of Furosemide 20 mg in the morning for 1 week. Please make a follow up appointment in the acute care clinic for 1 week. Please call us if you experience increased shortness of breath, excessive sleepiness, or dizziness.    Thank you,  Marianna Payment

## 2019-02-27 NOTE — Assessment & Plan Note (Addendum)
Patient continues to have +1 bilateral LE edema and shortness of breath, particularly with exertion. Although he admits to improvement in breathing, he may benefit from increased diuresis. He was instructed to get a scale to weight himself each day, but still have not purchased one. He has an appointment with cardiology this month.   Plan: - Change fureosimide  20 mg to twice a day for a total of 40 mg a day. We will follow up with him in a week. Will need to do labs at that time.

## 2019-02-28 NOTE — Progress Notes (Signed)
   CC: CHF  HPI:  [name] is a 73 y.o. male with past medical history stated  Below and presents today for a follow up for his CHF. Please see problem based assessment and plan for more details.     Past Medical History:  Diagnosis Date  . Acute lower GI bleeding 02/15/2017  . Atrial fibrillation ([place])   . Cardiomyopathy    EF55% 11/14<<35%   . CHF (congestive heart failure) ([place])   . COPD (chronic obstructive pulmonary disease) ([place])   . Essential hypertension   . GERD (gastroesophageal reflux disease)   . Gout   . Hepatitis    Possible history  . History of atrial flutter    Ablation 2012  . 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 Obesity   . Pneumonia X 1  . Primary polycythemia ([place]) 06/12/2013  . Renal insufficiency   . Substance abuse ([place])    History of alcohol; hx cocaine, heroin, crack use  . Syncope      Review of Systems: Review of Systems  Constitutional: Negative for chills and fever.  Respiratory: Positive for shortness of breath (while walking) and wheezing. Negative for cough.   Cardiovascular: Positive for leg swelling. Negative for chest pain and orthopnea.     Vitals:   02/27/19 0847  BP: (!) 101/54  Pulse: 70  Temp: 97.9 F (36.6 C)  TempSrc: Oral  SpO2: 96%  Weight: 225 lb 12.8 oz (102.4 kg)  Height: 5\' 6"  (1.676 m)    Physical Exam: Physical Exam  Constitutional: He is oriented to person, place, and time. No distress.  Cardiovascular: Normal rate, regular rhythm, normal heart sounds and intact distal pulses. Exam reveals no gallop and no friction rub.  No murmur heard. Hepatojugular reflex present  Pulmonary/Chest: No respiratory distress. He has wheezes (expiratory wheezing in upper lung fields). He exhibits no tenderness.  Neurological: He is alert and oriented to person, place, and time.     Assessment & Plan:   See Encounters Tab for problem based charting.  Patient seen with Dr. Rebeca Alert

## 2019-03-02 NOTE — Progress Notes (Signed)
Internal Medicine Clinic Attending  I saw and evaluated the patient.  I personally confirmed the key portions of the history and exam documented by Dr. Coe and I reviewed pertinent patient test results.  The assessment, diagnosis, and plan were formulated together and I agree with the documentation in the resident's note.  Alexander Raines, M.D., Ph.D.  

## 2019-03-06 NOTE — Telephone Encounter (Signed)
I agree. Thank you.

## 2019-03-11 NOTE — Progress Notes (Signed)
Cardiology Office Note Date:  03/14/2019  Patient ID:  [name], [name] [birth year], MRN [id number] PCP:  Marianna Payment, MD  Electrophysiologist:  Dr. Lovena Le     Chief Complaint: post hospital f/u  History of Present Illness: [name] is a 73 y.o. 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  He comes in today to be seen for Dr. Lovena Le. Last seen by EP service 02/2018 by A. Lynnell Jude, NP.  At that time noting only SR by last few years of EKGs, asymptomatic SB.  He had some edema off his lasix and this was resumed.   He was more recently hospitalized July 23 with sudden onset of SOB, resp failure, required intubation, was COVID negative, initially treated as pneumonia, COPD exacerbation, flash pulm edema, ? CHF.  W/u noted LVEF was 35-40%.  Cardiology was consulted, not felt to have ACS or need for ischemic evaluation.  Not a candidate for ACE/.ARB with renal function.  BP was limtting addition of hydralazine or nitrate Suggested out pt monitoring to evaluate for any potential rhythm abnormalities that may have contributed to his sudden onset SOB/respiratory failure, consideration for outpatient DCCV or perhaps stop amio if rhythm control no longer being pursued. He was discharged 02/13/2019  Long term monitor was ordered though I dont see was done. ?? Placed 8/8 INRs 02/13/2019, 2.5 02/20/2019, 2.9  02/20/2019: Creat 1.47 (looks about his baseline)    The patient speaks [place] well, and reports understands as well, no need for translation.  He tells me sine the hospital he has done well.  He remains with some swelling LE unchanged despite the increase in his lasix a few weeks ago by his PMD.  Not any worse either.  He reports no CP or palpitations, no cardiac awareness of any kind.  He reports initially had some SOB at night that kept him up, but not regularly and not of late.  He denies rest SOB, he does  have DOE though much improved from when he went to the hospital.  No dizzy spells, no near syncope or syncope.   He denies any bleeding or signs of bleeding   AFib AAD Amiodarone (goes back to at least 2014)  Device information MDT ILR, implanted 08/23/13 for syncope, Reached RRT 10/26/16    Past Medical History:  Diagnosis Date  . Acute lower GI bleeding 02/15/2017  . Atrial fibrillation ([place])   . Cardiomyopathy    EF55% 11/14<<35%   . CHF (congestive heart failure) ([place])   . COPD (chronic obstructive pulmonary disease) ([place])   . Essential hypertension   . GERD (gastroesophageal reflux disease)   . Gout   . Hepatitis    Possible history  . History of atrial flutter    Ablation 2012  . 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 Obesity   . Pneumonia X 1  . Primary polycythemia ([place]) 06/12/2013  . Renal insufficiency   . Substance abuse ([place])    History of alcohol; hx cocaine, heroin, crack use  . Syncope     Past Surgical History:  Procedure Laterality Date  . CARDIAC ELECTROPHYSIOLOGY MAPPING AND ABLATION  08/2010   [name] 09/07/2010 (12/14/2012)  . COLONOSCOPY     15-20 years ago had colon in [place]  . EXCISIONAL HEMORRHOIDECTOMY  1970's  . LOOP RECORDER IMPLANT N/A 08/23/2013   Procedure: LOOP RECORDER IMPLANT;  Surgeon: Deboraha Sprang, MD;  [facility]  C[facility];  Service: Cardiovascular;  Laterality: N/A;  . MULTIPLE EXTRACTIONS WITH ALVEOLOPLASTY Bilateral 01/24/2016   Procedure: MULTIPLE EXTRACTION WITH ALVEOLOPLASTY BILATERAL;  Surgeon: Diona Browner, DDS;  Location: [facility];  Service: Oral Surgery;  Laterality: Bilateral;  . MULTIPLE TOOTH EXTRACTIONS  01/24/2016   MULTIPLE EXTRACTION WITH ALVEOLOPLASTY BILATERAL (Bilateral)    Current Outpatient Medications  Medication Sig Dispense Refill  . albuterol (PROAIR HFA) 108 (90 Base) MCG/ACT inhaler Inhale 1-2 puffs into the lungs every 6 (six) hours as needed for wheezing or shortness of breath. 18 g 3  . albuterol  (PROVENTIL) (2.5 MG/3ML) 0.083% nebulizer solution Take 3 mLs (2.5 mg total) by nebulization 2 (two) times daily as needed for wheezing or shortness of breath. 75 mL 0  . amiodarone (PACERONE) 200 MG tablet Take 0.5 tablets (100 mg total) by mouth daily. 45 tablet 3  . camphor-menthol (SARNA) lotion Apply topically 3 (three) times daily. 222 mL 0  . carvedilol (COREG) 3.125 MG tablet Take 1 tablet (3.125 mg total) by mouth daily. 90 tablet 2  . Fluticasone-Umeclidin-Vilant (TRELEGY ELLIPTA) 100-62.5-25 MCG/INH AEPB Inhale 1 puff into the lungs daily. 60 each 5  . folic acid (FOLVITE) 1 MG tablet Take 1 tablet (1 mg total) by mouth daily. 90 tablet 3  . hydroxyurea (HYDREA) 500 MG capsule Take 2 capsules (1,000 mg total) by mouth daily. MAY TAKE WITH FOOD TO MINIMIZE GI SIDE EFFECTS (Patient taking differently: Take 1,000 mg by mouth daily. ) 180 capsule 3  . SUBOXONE 8-2 MG FILM Place 1 Film under the tongue daily.     [name] warfarin (COUMADIN) 2.5 MG tablet TAKE 1/2 TABLET BY MOUTH ON TUESDAY AND FRIDAY. TAKE 1 TABLET DAILY ON THE OTHER DAYS (Patient taking differently: Take 1.25-2.5 mg by mouth See admin instructions. Per 12/27/18 visit anticoagulant dose Take 1.25 mg on Tuesday and Thursday 2.5 mg all the other days) 30 tablet 2  . furosemide (LASIX) 20 MG tablet Take 1 tablet (20 mg total) by mouth daily. 90 tablet 3   No current facility-administered medications for this visit.     Allergies:   Codeine   Social History:  The patient  reports that he has been smoking cigarettes. He has a 5.00 pack-year smoking history. He has never used smokeless tobacco. He reports current alcohol use of about 89.0 standard drinks of alcohol per week. He reports previous drug use. Drugs: "Crack" cocaine, Cocaine, and Heroin.   Family History:  The patient's family history includes Alcohol abuse in his father; Cirrhosis in his father; Diabetes in his mother; Hypertension in his mother.  ROS:  Please see the  history of present illness.  All other systems are reviewed and otherwise negative.   PHYSICAL EXAM:  VS:  BP 126/66   Pulse 84   Ht 5\' 6"  (1.676 m)   Wt 224 lb (101.6 kg)   BMI 36.15 kg/m  BMI: Body mass index is 36.15 kg/m. Well nourished, well developed, in no acute distress  HEENT: normocephalic, atraumatic  Neck: no JVD, carotid bruits or masses Cardiac:  irreg-irreg; no significant murmurs, no rubs, or gallops Lungs:  CTA b/l, no wheezing, rhonchi or rales  Abd: soft, nontender MS: no deformity or atrophy, walks with a cane Ext: 1++ edema to mid shin b/l Skin: warm and dry, no rash Neuro:  No gross deficits appreciated Psych: euthymic mood, full affect  ILR site is stable    EKG:  Done today and reviewed by myself shows AFib  84bpm    Echocardiogram 02/10/2019: 1. The left ventricle has moderately reduced systolic function, with an ejection fraction of 35-40%. The cavity size was moderately dilated. There is mild concentric left ventricular hypertrophy. Left ventricular diastolic function could not be  evaluated secondary to atrial fibrillation. Left ventricular diffuse hypokinesis. 2. The right ventricle has normal systolic function. The cavity was normal. There is no increase in right ventricular wall thickness. Right ventricular systolic pressure is normal with an estimated pressure of 26.7 mmHg. 3. Large pleural effusion in the left lateral region. 4. Small pericardial effusion. 5. The pericardial effusion is posterior to the left ventricle. 6. Mild thickening of the mitral valve leaflet. 7. The aortic valve is tricuspid. Aortic valve regurgitation is moderate by color flow Doppler. 8. The aorta is abnormal in size and structure.  06/07/14: LVEF 60-65% 04/29/14; LVEF 50-55% 06/09/13; LVEF 50-55% 08/31/12: LVEF 40-45%     Recent Labs: 02/09/2019: ALT 16; B Natriuretic Peptide 419.9 02/10/2019: Magnesium 2.1 02/12/2019: Hemoglobin 13.9; Platelets 133  02/20/2019: BUN 19; Creatinine, Ser 1.47; Potassium 4.2; Sodium 143  02/10/2019: Triglycerides 144   CrCl cannot be calculated (Patient's most recent lab result is older than the maximum 21 days allowed.).   Wt Readings from Last 3 Encounters:  03/14/19 224 lb (101.6 kg)  02/27/19 225 lb 12.8 oz (102.4 kg)  02/20/19 225 lb 11.2 oz (102.4 kg)     Other studies reviewed: Additional studies/records reviewed today include: summarized above  ASSESSMENT AND PLAN:  1. Persistent AFib     CHA2DS2Vasc is 3, on warfarin, monitored and managed by his PMD     He is unaware of palpitations, no cardiac awareness of any kind.      He would like to avoid TEE, will plan for weekly INRs via his PMD, I will have him see AFib clinic in 3 weeks to plan fo DCCV if he is still in Afib and if INRs allow. Plan to repeat his echo once back in SR  I think trying rhythm control is reasonable given to now he has been felt to be maintaining SR Continue amio for now   2. CM 3. Chronic CHF (systolic)     Suspect NICM by cardiology consult given diffuse hypokineses     He has some edema he reports unchanged for him despite the increase in his lasix a few weeks ago with his PMD.     His EF has been similar in the past, ? If 2/2 his Afib Plan to update echo once back in SR    Discussed if he starts to have increasing DOE, or recurrent SOB at night to let usknow right away.  He states understanding      4. HTN     Had some relative hypotension in the hospital last month     Looks OK today    Disposition:   BMET given his lasix, TSH and LFTs for his amio, weekly INR's via his PMD will get one today and plan to start with his PMD next week, AFib clinic visit in 3 weeks to plan DCCV.  I do not have his monitor results today, his HR looks good today.  (Monitor is still in transit to company for resulting).  I will wait for his BMET prior to titrating his lasix any further.     Current medicines are reviewed  at length with the patient today.  The patient did not have any concerns regarding medicines.    Signed, Tommye Standard,  PA-C 03/14/2019 8:47 AM     [facility] [address] [place] [address] [phone] (office)  [phone] (fax)

## 2019-03-14 ENCOUNTER — Ambulatory Visit (INDEPENDENT_AMBULATORY_CARE_PROVIDER_SITE_OTHER): Payer: Medicare Other | Admitting: Physician Assistant

## 2019-03-14 ENCOUNTER — Encounter: Payer: Self-pay | Admitting: Physician Assistant

## 2019-03-14 ENCOUNTER — Other Ambulatory Visit: Payer: Self-pay

## 2019-03-14 VITALS — BP 126/66 | HR 84 | Ht 66.0 in | Wt 224.0 lb

## 2019-03-14 DIAGNOSIS — I10 Essential (primary) hypertension: Secondary | ICD-10-CM

## 2019-03-14 DIAGNOSIS — I5022 Chronic systolic (congestive) heart failure: Secondary | ICD-10-CM

## 2019-03-14 DIAGNOSIS — I428 Other cardiomyopathies: Secondary | ICD-10-CM

## 2019-03-14 DIAGNOSIS — Z79899 Other long term (current) drug therapy: Secondary | ICD-10-CM | POA: Diagnosis not present

## 2019-03-14 DIAGNOSIS — I4819 Other persistent atrial fibrillation: Secondary | ICD-10-CM

## 2019-03-14 LAB — HEPATIC FUNCTION PANEL
ALT: 13 IU/L (ref 0–44)
AST: 18 IU/L (ref 0–40)
Albumin: 3.8 g/dL (ref 3.7–4.7)
Alkaline Phosphatase: 102 IU/L (ref 39–117)
Bilirubin Total: 1 mg/dL (ref 0.0–1.2)
Bilirubin, Direct: 0.26 mg/dL (ref 0.00–0.40)
Total Protein: 6.4 g/dL (ref 6.0–8.5)

## 2019-03-14 LAB — BASIC METABOLIC PANEL
BUN/Creatinine Ratio: 16 (ref 10–24)
BUN: 22 mg/dL (ref 8–27)
CO2: 26 mmol/L (ref 20–29)
Calcium: 9 mg/dL (ref 8.6–10.2)
Chloride: 102 mmol/L (ref 96–106)
Creatinine, Ser: 1.35 mg/dL — ABNORMAL HIGH (ref 0.76–1.27)
GFR calc Af Amer: 61 mL/min/{1.73_m2} (ref >59)
GFR calc non Af Amer: 52 mL/min/{1.73_m2} — ABNORMAL LOW (ref >59)
Glucose: 114 mg/dL — ABNORMAL HIGH (ref 65–99)
Potassium: 3.9 mmol/L (ref 3.5–5.2)
Sodium: 145 mmol/L — ABNORMAL HIGH (ref 134–144)

## 2019-03-14 LAB — PROTIME-INR
INR: 3.1 — ABNORMAL HIGH (ref 0.8–1.2)
Prothrombin Time: 31.3 s — ABNORMAL HIGH (ref 9.1–12.0)

## 2019-03-14 LAB — TSH: TSH: 4.92 u[IU]/mL — ABNORMAL HIGH (ref 0.450–4.500)

## 2019-03-14 NOTE — Patient Instructions (Addendum)
Medication Instructions:  Your physician recommends that you continue on your current medications as directed. Please refer to the Current Medication list given to you today.   If you need a refill on your cardiac medications before your next appointment, please call your pharmacy.   Lab work: BMET TSH AND LFT  INR  TODAY    If you have labs (blood work) drawn today and your tests are completely normal, you will receive your results only by: [name] MyChart Message (if you have MyChart) OR . A paper copy in the mail If you have any lab test that is abnormal or we need to change your treatment, we will call you to review the results.  Testing/Procedures: NONE ORDERED  TODAY   Follow-Up: AFIB CLINIC IN 3 WEEKS   RENEE URSUY IN 6 TO 7 WEEKS   Any Other Special Instructions Will Be Listed Below (If Applicable).  STARTING NEXT WEEK START GET WEEKLY INR CHECKS WITH PRIMARY CARE DOCTOR   FOR THE NEXT 4 WEEKS

## 2019-03-20 ENCOUNTER — Ambulatory Visit (INDEPENDENT_AMBULATORY_CARE_PROVIDER_SITE_OTHER): Payer: Medicare Other | Admitting: Pharmacist

## 2019-03-20 ENCOUNTER — Other Ambulatory Visit: Payer: Self-pay

## 2019-03-20 DIAGNOSIS — I4891 Unspecified atrial fibrillation: Secondary | ICD-10-CM | POA: Diagnosis not present

## 2019-03-20 DIAGNOSIS — Z7901 Long term (current) use of anticoagulants: Secondary | ICD-10-CM

## 2019-03-20 DIAGNOSIS — Z5181 Encounter for therapeutic drug level monitoring: Secondary | ICD-10-CM

## 2019-03-20 DIAGNOSIS — I4819 Other persistent atrial fibrillation: Secondary | ICD-10-CM

## 2019-03-20 LAB — POCT INR: INR: 3.7 — AB (ref 2.0–3.0)

## 2019-03-20 NOTE — Patient Instructions (Signed)
Patient instructed to take medications as defined in the Anti-coagulation Track section of this encounter.  Patient instructed to take today's dose.  Patient instructed to take one (1) tablet of your 2.5mg  green-colored warfarin tablets every day of the week, except take only one-half (1/2) tablet on Mondays, Wednesdays and Fridays. Repeat this regimen weekly. Patient verbalized understanding of these instructions.

## 2019-03-20 NOTE — Progress Notes (Signed)
Anticoagulation Management [name] is a 73 y.o. male who reports to the clinic for monitoring of warfarin treatment.    Indication: atrial fibrillation; Long term current use of anticoagulant.   Duration: indefinite Supervising physician: Gilles Chiquito  Anticoagulation Clinic Visit History: Patient does not report signs/symptoms of bleeding or thromboembolism  Other recent changes: No diet, medications, lifestyle changes except as noted in patient findings.  Anticoagulation Episode Summary    Current INR goal:  2.0-3.0  TTR:  67.2 % (6.2 y)  Next INR check:  03/28/2019  INR from last check:  3.7 (03/20/2019)  Weekly max warfarin dose:    Target end date:  Indefinite  INR check location:  Anticoagulation Clinic  Preferred lab:    Send INR reminders to:     Indications   Atrial fibrillation ([place]) [I48.91]       Comments:          Allergies  Allergen Reactions  . Codeine Shortness Of Breath and Rash    Current Outpatient Medications:  .  albuterol (PROAIR HFA) 108 (90 Base) MCG/ACT inhaler, Inhale 1-2 puffs into the lungs every 6 (six) hours as needed for wheezing or shortness of breath., Disp: 18 g, Rfl: 3 .  albuterol (PROVENTIL) (2.5 MG/3ML) 0.083% nebulizer solution, Take 3 mLs (2.5 mg total) by nebulization 2 (two) times daily as needed for wheezing or shortness of breath., Disp: 75 mL, Rfl: 0 .  amiodarone (PACERONE) 200 MG tablet, Take 0.5 tablets (100 mg total) by mouth daily., Disp: 45 tablet, Rfl: 3 .  camphor-menthol (SARNA) lotion, Apply topically 3 (three) times daily., Disp: 222 mL, Rfl: 0 .  carvedilol (COREG) 3.125 MG tablet, Take 1 tablet (3.125 mg total) by mouth daily., Disp: 90 tablet, Rfl: 2 .  Fluticasone-Umeclidin-Vilant (TRELEGY ELLIPTA) 100-62.5-25 MCG/INH AEPB, Inhale 1 puff into the lungs daily., Disp: 60 each, Rfl: 5 .  folic acid (FOLVITE) 1 MG tablet, Take 1 tablet (1 mg total) by mouth daily., Disp: 90 tablet, Rfl: 3 .  furosemide  (LASIX) 20 MG tablet, Take 20 mg by mouth 2 (two) times daily., Disp: , Rfl:  .  hydroxyurea (HYDREA) 500 MG capsule, Take 2 capsules (1,000 mg total) by mouth daily. MAY TAKE WITH FOOD TO MINIMIZE GI SIDE EFFECTS (Patient taking differently: Take 1,000 mg by mouth daily. ), Disp: 180 capsule, Rfl: 3 .  SUBOXONE 8-2 MG FILM, Place 1 Film under the tongue daily. , Disp: , Rfl:  .  warfarin (COUMADIN) 2.5 MG tablet, TAKE 1/2 TABLET BY MOUTH ON TUESDAY AND FRIDAY. TAKE 1 TABLET DAILY ON THE OTHER DAYS (Patient taking differently: Take 1.25-2.5 mg by mouth See admin instructions. Per 12/27/18 visit anticoagulant dose Take 1.25 mg on Tuesday and Thursday 2.5 mg all the other days), Disp: 30 tablet, Rfl: 2 Past Medical History:  Diagnosis Date  . Acute lower GI bleeding 02/15/2017  . Atrial fibrillation ([place])   . Cardiomyopathy    EF55% 11/14<<35%   . CHF (congestive heart failure) ([place])   . COPD (chronic obstructive pulmonary disease) ([place])   . Essential hypertension   . GERD (gastroesophageal reflux disease)   . Gout   . Hepatitis    Possible history  . History of atrial flutter    Ablation 2012  . 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 Obesity   . Pneumonia X 1  . Primary polycythemia ([place]) 06/12/2013  . Renal insufficiency   . Substance abuse ([place])    History of alcohol; hx  cocaine, heroin, crack use  . Syncope    Social History   Socioeconomic History  . Marital status: Single    Spouse name: Not on file  . Number of children: Not on file  . Years of education: Not on file  . Highest education level: Not on file  Occupational History  . Occupation: Retired  Scientific laboratory technician  . Financial resource strain: Not on file  . Food insecurity    Worry: Not on file    Inability: Not on file  . Transportation needs    Medical: Not on file    Non-medical: Not on file  Tobacco Use  . Smoking status: Current Every Day Smoker    Packs/day: 0.10    Years: 50.00    Pack years: 5.00     Types: Cigarettes  . Smokeless tobacco: Never Used  . Tobacco comment: 2-3 cigs per day  Substance and Sexual Activity  . Alcohol use: Yes    Alcohol/week: 89.0 standard drinks    Types: 12 Cans of beer, 77 Shots of liquor per week    Comment: Sometimes.  . Drug use: Not Currently    Types: "Crack" cocaine, Cocaine, Heroin    Comment: 02/15/2017 "last used crack 4-5 years ago; last used heroin a couple years ago; currently denies any drug use-- on suboxone.   . Sexual activity: Not Currently  Lifestyle  . Physical activity    Days per week: Not on file    Minutes per session: Not on file  . Stress: Not on file  Relationships  . Social Herbalist on phone: Not on file    Gets together: Not on file    Attends religious service: Not on file    Active member of club or organization: Not on file    Attends meetings of clubs or organizations: Not on file    Relationship status: Not on file  Other Topics Concern  . Not on file  Social History Narrative   Moved here from [place]. Lives with common law wife and grandchildren. He is retired from "general labor."   Family History  Problem Relation Age of Onset  . Diabetes Mother   . Hypertension Mother   . Cirrhosis Father   . Alcohol abuse Father   . Colon cancer Neg Hx   . Rectal cancer Neg Hx   . Stomach cancer Neg Hx     ASSESSMENT Recent Results: The most recent result is correlated with 15 mg per week: Lab Results  Component Value Date   INR 3.7 (A) 03/20/2019   INR 3.1 (H) 03/14/2019   INR 2.9 02/20/2019    Anticoagulation Dosing: Description   Take one (1) tablet of your 2.5mg  green-colored warfarin tablets every day of the week, except take only one-half (1/2) tablet on Mondays, Wednesdays and Fridays. Repeat this regimen weekly.     INR today: Supratherapeutic  PLAN Weekly dose was decreased by 8% to 13.75 mg per week  Patient Instructions  Patient instructed to take medications as defined in  the Anti-coagulation Track section of this encounter.  Patient instructed to take today's dose.  Patient instructed to take one (1) tablet of your 2.5mg  green-colored warfarin tablets every day of the week, except take only one-half (1/2) tablet on Mondays, Wednesdays and Fridays. Repeat this regimen weekly. Patient verbalized understanding of these instructions.     Patient advised to contact clinic or seek medical attention if signs/symptoms of bleeding or thromboembolism occur.  Patient verbalized understanding by repeating back information and was advised to contact me if further medication-related questions arise. Patient was also provided an information handout.  Follow-up Return in 8 days (on 03/28/2019) for Follow up INR.  Pennie Banter, PharmD, CPP  15 minutes spent face-to-face with the patient during the encounter. 50% of time spent on education, including signs/sx bleeding and clotting, as well as food and drug interactions with warfarin. 50% of time was spent on fingerprick POC INR sample collection,processing, results determination, and documentation in http://www.kim.net/.

## 2019-03-22 ENCOUNTER — Other Ambulatory Visit: Payer: Self-pay | Admitting: Nurse Practitioner

## 2019-03-22 NOTE — Progress Notes (Signed)
I reviewed Dr. Gladstone Pih note.  Patient on [facility] for Afib.  INR high and dose decreased.

## 2019-03-23 ENCOUNTER — Other Ambulatory Visit: Payer: Self-pay

## 2019-03-23 NOTE — Telephone Encounter (Signed)
Pt's pharmacy is requesting a refill for Furosemide 20 mg daily. This medication on pt's med list states for pt to take BID. Please address

## 2019-03-28 ENCOUNTER — Other Ambulatory Visit: Payer: Self-pay

## 2019-03-28 ENCOUNTER — Ambulatory Visit (INDEPENDENT_AMBULATORY_CARE_PROVIDER_SITE_OTHER): Payer: Medicare Other | Admitting: Pharmacist

## 2019-03-28 ENCOUNTER — Other Ambulatory Visit: Payer: Self-pay | Admitting: *Deleted

## 2019-03-28 DIAGNOSIS — Z5181 Encounter for therapeutic drug level monitoring: Secondary | ICD-10-CM | POA: Diagnosis not present

## 2019-03-28 DIAGNOSIS — I4891 Unspecified atrial fibrillation: Secondary | ICD-10-CM

## 2019-03-28 DIAGNOSIS — Z7901 Long term (current) use of anticoagulants: Secondary | ICD-10-CM

## 2019-03-28 DIAGNOSIS — I4819 Other persistent atrial fibrillation: Secondary | ICD-10-CM

## 2019-03-28 LAB — POCT INR: INR: 2.2 (ref 2.0–3.0)

## 2019-03-28 NOTE — Progress Notes (Signed)
Anticoagulation Management [name] is a 73 y.o. male who reports to the clinic for monitoring of warfarin treatment.    Indication: atrial fibrillation; current long term use of anticoagulant.   Duration: indefinite Supervising physician: Velna Ochs, MD  Anticoagulation Clinic Visit History: Patient does not report signs/symptoms of bleeding or thromboembolism  Other recent changes:No diet, medications, lifestyle changes endorsed since last visit.  Anticoagulation Episode Summary    Current INR goal:  2.0-3.0  TTR:  67.1 % (6.3 y)  Next INR check:  04/17/2019  INR from last check:    Weekly max warfarin dose:    Target end date:  Indefinite  INR check location:  Anticoagulation Clinic  Preferred lab:    Send INR reminders to:     Indications   Atrial fibrillation ([place]) [I48.91]       Comments:          Allergies  Allergen Reactions  . Codeine Shortness Of Breath and Rash    Current Outpatient Medications:  .  albuterol (PROAIR HFA) 108 (90 Base) MCG/ACT inhaler, Inhale 1-2 puffs into the lungs every 6 (six) hours as needed for wheezing or shortness of breath., Disp: 18 g, Rfl: 3 .  albuterol (PROVENTIL) (2.5 MG/3ML) 0.083% nebulizer solution, Take 3 mLs (2.5 mg total) by nebulization 2 (two) times daily as needed for wheezing or shortness of breath., Disp: 75 mL, Rfl: 0 .  amiodarone (PACERONE) 200 MG tablet, Take 0.5 tablets (100 mg total) by mouth daily., Disp: 45 tablet, Rfl: 3 .  camphor-menthol (SARNA) lotion, Apply topically 3 (three) times daily., Disp: 222 mL, Rfl: 0 .  carvedilol (COREG) 3.125 MG tablet, Take 1 tablet (3.125 mg total) by mouth daily., Disp: 90 tablet, Rfl: 2 .  Fluticasone-Umeclidin-Vilant (TRELEGY ELLIPTA) 100-62.5-25 MCG/INH AEPB, Inhale 1 puff into the lungs daily., Disp: 60 each, Rfl: 5 .  folic acid (FOLVITE) 1 MG tablet, Take 1 tablet (1 mg total) by mouth daily., Disp: 90 tablet, Rfl: 3 .  furosemide (LASIX) 20 MG  tablet, Take 20 mg by mouth 2 (two) times daily., Disp: , Rfl:  .  hydroxyurea (HYDREA) 500 MG capsule, Take 2 capsules (1,000 mg total) by mouth daily. MAY TAKE WITH FOOD TO MINIMIZE GI SIDE EFFECTS (Patient taking differently: Take 1,000 mg by mouth daily. ), Disp: 180 capsule, Rfl: 3 .  SUBOXONE 8-2 MG FILM, Place 1 Film under the tongue daily. , Disp: , Rfl:  .  warfarin (COUMADIN) 2.5 MG tablet, TAKE 1/2 TABLET BY MOUTH ON TUESDAY AND FRIDAY. TAKE 1 TABLET DAILY ON THE OTHER DAYS (Patient taking differently: Take 1.25-2.5 mg by mouth See admin instructions. Per 12/27/18 visit anticoagulant dose Take 1.25 mg on Tuesday and Thursday 2.5 mg all the other days), Disp: 30 tablet, Rfl: 2 Past Medical History:  Diagnosis Date  . Acute lower GI bleeding 02/15/2017  . Atrial fibrillation ([place])   . Cardiomyopathy    EF55% 11/14<<35%   . CHF (congestive heart failure) ([place])   . COPD (chronic obstructive pulmonary disease) ([place])   . Essential hypertension   . GERD (gastroesophageal reflux disease)   . Gout   . Hepatitis    Possible history  . History of atrial flutter    Ablation 2012  . 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 Obesity   . Pneumonia X 1  . Primary polycythemia ([place]) 06/12/2013  . Renal insufficiency   . Substance abuse ([place])    History of alcohol; hx cocaine, heroin, crack  use  . Syncope    Social History   Socioeconomic History  . Marital status: Single    Spouse name: Not on file  . Number of children: Not on file  . Years of education: Not on file  . Highest education level: Not on file  Occupational History  . Occupation: Retired  Scientific laboratory technician  . Financial resource strain: Not on file  . Food insecurity    Worry: Not on file    Inability: Not on file  . Transportation needs    Medical: Not on file    Non-medical: Not on file  Tobacco Use  . Smoking status: Current Every Day Smoker    Packs/day: 0.10    Years: 50.00    Pack years: 5.00    Types:  Cigarettes  . Smokeless tobacco: Never Used  . Tobacco comment: 2-3 cigs per day  Substance and Sexual Activity  . Alcohol use: Yes    Alcohol/week: 89.0 standard drinks    Types: 12 Cans of beer, 77 Shots of liquor per week    Comment: Sometimes.  . Drug use: Not Currently    Types: "Crack" cocaine, Cocaine, Heroin    Comment: 02/15/2017 "last used crack 4-5 years ago; last used heroin a couple years ago; currently denies any drug use-- on suboxone.   . Sexual activity: Not Currently  Lifestyle  . Physical activity    Days per week: Not on file    Minutes per session: Not on file  . Stress: Not on file  Relationships  . Social Herbalist on phone: Not on file    Gets together: Not on file    Attends religious service: Not on file    Active member of club or organization: Not on file    Attends meetings of clubs or organizations: Not on file    Relationship status: Not on file  Other Topics Concern  . Not on file  Social History Narrative   Moved here from [place]. Lives with common law wife and grandchildren. He is retired from "general labor."   Family History  Problem Relation Age of Onset  . Diabetes Mother   . Hypertension Mother   . Cirrhosis Father   . Alcohol abuse Father   . Colon cancer Neg Hx   . Rectal cancer Neg Hx   . Stomach cancer Neg Hx     ASSESSMENT Recent Results: The most recent result is correlated with 13.75 mg per week: Lab Results  Component Value Date   INR 2.2 03/28/2019   INR 3.7 (A) 03/20/2019   INR 3.1 (H) 03/14/2019    Anticoagulation Dosing: Description   Take one (1) tablet of your 2.5mg  green-colored warfarin tablets every day of the week, except take only one-half (1/2) tablet on Mondays and Thursdays. Repeat this regimen weekly.     INR today: Therapeutic  PLAN Weekly dose was increased by 9% to 15 mg per week  Patient Instructions  Patient instructed to take medications as defined in the Anti-coagulation  Track section of this encounter.  Patient instructed to take today's dose.  Patient instructed to take one (1) tablet of your 2.5mg  green-colored warfarin tablets every day of the week, except take only one-half (1/2) tablet on Mondays and Thursdays. Repeat this regimen weekly. Patient verbalized understanding of these instructions.     Patient advised to contact clinic or seek medical attention if signs/symptoms of bleeding or thromboembolism occur.  Patient verbalized understanding by repeating  back information and was advised to contact me if further medication-related questions arise. Patient was also provided an information handout.  Follow-up Return in 20 days (on 04/17/2019) for Follow up INR.  Pennie Banter, PharmD, CPP  15 minutes spent face-to-face with the patient during the encounter. 50% of time spent on education, including signs/sx bleeding and clotting, as well as food and drug interactions with warfarin. 50% of time was spent on fingerprick POC INR sample collection,processing, results determination, and documentation in http://www.kim.net/.

## 2019-03-28 NOTE — Patient Instructions (Signed)
Patient instructed to take medications as defined in the Anti-coagulation Track section of this encounter.  Patient instructed to take today's dose.  Patient instructed to take one (1) tablet of your 2.5mg  green-colored warfarin tablets every day of the week, except take only one-half (1/2) tablet on Mondays and Thursdays. Repeat this regimen weekly. Patient verbalized understanding of these instructions.

## 2019-03-28 NOTE — Telephone Encounter (Signed)
Received faxed refill request from pt's pharmacy for carvedilol 3.125mg  tablets-take 4 (four) tabs by mouth daily.  Per pt's electronic medical record-he should be taking 1(one) tab daily.  Call made to pt-he confirmed that is paperwork states for his to take one daily and that is what he has been doing.  Will send to pcp for review.  FYI: Pt also had labs obtained on 03/14/19 by cardiology office.  Pt also requests a refill on his furosemide 20mg  tabs-states he takes 2 (two) tabs daily.  States he is completely out-  Furosemide refill request has already been sent to cardiology office and is pending review, pt aware.Regenia Skeeter, Zyler Hyson Cassady9/8/20203:37 PM

## 2019-03-29 ENCOUNTER — Other Ambulatory Visit: Payer: Self-pay | Admitting: Nurse Practitioner

## 2019-03-29 MED ORDER — CARVEDILOL 3.125 MG PO TABS: 3.1250 mg | ORAL_TABLET | Freq: Every day | ORAL | 2 refills | Status: DC

## 2019-03-31 ENCOUNTER — Inpatient Hospital Stay (HOSPITAL_COMMUNITY): Payer: Medicare Other

## 2019-03-31 ENCOUNTER — Other Ambulatory Visit: Payer: Self-pay

## 2019-03-31 ENCOUNTER — Emergency Department (HOSPITAL_COMMUNITY): Payer: Medicare Other

## 2019-03-31 ENCOUNTER — Inpatient Hospital Stay (HOSPITAL_COMMUNITY)
Admission: EM | Admit: 2019-03-31 | Discharge: 2019-04-04 | DRG: 208 | Disposition: A | Discharge status: Home / Self Care | Payer: Medicare Other | Attending: Internal Medicine | Admitting: Internal Medicine

## 2019-03-31 ENCOUNTER — Encounter (HOSPITAL_COMMUNITY): Payer: Self-pay

## 2019-03-31 DIAGNOSIS — J9602 Acute respiratory failure with hypercapnia: Secondary | ICD-10-CM | POA: Diagnosis present

## 2019-03-31 DIAGNOSIS — J441 Chronic obstructive pulmonary disease with (acute) exacerbation: Secondary | ICD-10-CM | POA: Diagnosis not present

## 2019-03-31 DIAGNOSIS — I4819 Other persistent atrial fibrillation: Secondary | ICD-10-CM | POA: Diagnosis not present

## 2019-03-31 DIAGNOSIS — Z6834 Body mass index (BMI) 34.0-34.9, adult: Secondary | ICD-10-CM | POA: Diagnosis not present

## 2019-03-31 DIAGNOSIS — D45 Polycythemia vera: Secondary | ICD-10-CM | POA: Diagnosis present

## 2019-03-31 DIAGNOSIS — E669 Obesity, unspecified: Secondary | ICD-10-CM | POA: Diagnosis present

## 2019-03-31 DIAGNOSIS — I480 Paroxysmal atrial fibrillation: Secondary | ICD-10-CM | POA: Diagnosis present

## 2019-03-31 DIAGNOSIS — J9601 Acute respiratory failure with hypoxia: Principal | ICD-10-CM | POA: Diagnosis present

## 2019-03-31 DIAGNOSIS — Z885 Allergy status to narcotic agent status: Secondary | ICD-10-CM | POA: Diagnosis not present

## 2019-03-31 DIAGNOSIS — I130 Hypertensive heart and chronic kidney disease with heart failure and stage 1 through stage 4 chronic kidney disease, or unspecified chronic kidney disease: Secondary | ICD-10-CM | POA: Diagnosis present

## 2019-03-31 DIAGNOSIS — Z20828 Contact with and (suspected) exposure to other viral communicable diseases: Secondary | ICD-10-CM | POA: Diagnosis present

## 2019-03-31 DIAGNOSIS — I4891 Unspecified atrial fibrillation: Secondary | ICD-10-CM | POA: Diagnosis present

## 2019-03-31 DIAGNOSIS — Z7951 Long term (current) use of inhaled steroids: Secondary | ICD-10-CM

## 2019-03-31 DIAGNOSIS — J690 Pneumonitis due to inhalation of food and vomit: Secondary | ICD-10-CM | POA: Diagnosis present

## 2019-03-31 DIAGNOSIS — N183 Chronic kidney disease, stage 3 (moderate): Secondary | ICD-10-CM | POA: Diagnosis present

## 2019-03-31 DIAGNOSIS — Z79899 Other long term (current) drug therapy: Secondary | ICD-10-CM

## 2019-03-31 DIAGNOSIS — N170 Acute kidney failure with tubular necrosis: Secondary | ICD-10-CM | POA: Diagnosis present

## 2019-03-31 DIAGNOSIS — Z8249 Family history of ischemic heart disease and other diseases of the circulatory system: Secondary | ICD-10-CM

## 2019-03-31 DIAGNOSIS — K219 Gastro-esophageal reflux disease without esophagitis: Secondary | ICD-10-CM | POA: Diagnosis present

## 2019-03-31 DIAGNOSIS — F1120 Opioid dependence, uncomplicated: Secondary | ICD-10-CM | POA: Diagnosis present

## 2019-03-31 DIAGNOSIS — R230 Cyanosis: Secondary | ICD-10-CM | POA: Diagnosis present

## 2019-03-31 DIAGNOSIS — I313 Pericardial effusion (noninflammatory): Secondary | ICD-10-CM | POA: Diagnosis present

## 2019-03-31 DIAGNOSIS — G9340 Encephalopathy, unspecified: Secondary | ICD-10-CM | POA: Diagnosis not present

## 2019-03-31 DIAGNOSIS — Z452 Encounter for adjustment and management of vascular access device: Secondary | ICD-10-CM | POA: Diagnosis not present

## 2019-03-31 DIAGNOSIS — Z0189 Encounter for other specified special examinations: Secondary | ICD-10-CM

## 2019-03-31 DIAGNOSIS — I428 Other cardiomyopathies: Secondary | ICD-10-CM | POA: Diagnosis present

## 2019-03-31 DIAGNOSIS — Z9119 Patient's noncompliance with other medical treatment and regimen: Secondary | ICD-10-CM

## 2019-03-31 DIAGNOSIS — Y929 Unspecified place or not applicable: Secondary | ICD-10-CM | POA: Diagnosis not present

## 2019-03-31 DIAGNOSIS — E872 Acidosis: Secondary | ICD-10-CM | POA: Diagnosis present

## 2019-03-31 DIAGNOSIS — I509 Heart failure, unspecified: Secondary | ICD-10-CM

## 2019-03-31 DIAGNOSIS — J810 Acute pulmonary edema: Secondary | ICD-10-CM | POA: Diagnosis not present

## 2019-03-31 DIAGNOSIS — I5023 Acute on chronic systolic (congestive) heart failure: Secondary | ICD-10-CM | POA: Diagnosis present

## 2019-03-31 DIAGNOSIS — Z833 Family history of diabetes mellitus: Secondary | ICD-10-CM

## 2019-03-31 DIAGNOSIS — I10 Essential (primary) hypertension: Secondary | ICD-10-CM | POA: Diagnosis present

## 2019-03-31 DIAGNOSIS — F17210 Nicotine dependence, cigarettes, uncomplicated: Secondary | ICD-10-CM | POA: Diagnosis present

## 2019-03-31 DIAGNOSIS — Z7901 Long term (current) use of anticoagulants: Secondary | ICD-10-CM

## 2019-03-31 DIAGNOSIS — Z862 Personal history of diseases of the blood and blood-forming organs and certain disorders involving the immune mechanism: Secondary | ICD-10-CM

## 2019-03-31 DIAGNOSIS — S80212A Abrasion, left knee, initial encounter: Secondary | ICD-10-CM | POA: Diagnosis present

## 2019-03-31 LAB — POCT I-STAT 7, (LYTES, BLD GAS, ICA,H+H)
Acid-base deficit: 1 mmol/L (ref 0.0–2.0)
Bicarbonate: 26.7 mmol/L (ref 20.0–28.0)
Calcium, Ion: 1.13 mmol/L — ABNORMAL LOW (ref 1.15–1.40)
HCT: 47 % (ref 39.0–52.0)
Hemoglobin: 16 g/dL (ref 13.0–17.0)
O2 Saturation: 97 %
Patient temperature: 98.6
Potassium: 4.5 mmol/L (ref 3.5–5.1)
Sodium: 144 mmol/L (ref 135–145)
TCO2: 28 mmol/L (ref 22–32)
pCO2 arterial: 56.9 mmHg — ABNORMAL HIGH (ref 32.0–48.0)
pH, Arterial: 7.28 — ABNORMAL LOW (ref 7.350–7.450)
pO2, Arterial: 99 mmHg (ref 83.0–108.0)

## 2019-03-31 LAB — COMPREHENSIVE METABOLIC PANEL
ALT: 32 U/L (ref 0–44)
AST: 42 U/L — ABNORMAL HIGH (ref 15–41)
Albumin: 3.4 g/dL — ABNORMAL LOW (ref 3.5–5.0)
Alkaline Phosphatase: 117 U/L (ref 38–126)
Anion gap: 13 (ref 5–15)
BUN: 22 mg/dL (ref 8–23)
CO2: 21 mmol/L — ABNORMAL LOW (ref 22–32)
Calcium: 8.5 mg/dL — ABNORMAL LOW (ref 8.9–10.3)
Chloride: 108 mmol/L (ref 98–111)
Creatinine, Ser: 1.84 mg/dL — ABNORMAL HIGH (ref 0.61–1.24)
GFR calc Af Amer: 42 mL/min — ABNORMAL LOW (ref >60)
GFR calc non Af Amer: 36 mL/min — ABNORMAL LOW (ref >60)
Glucose, Bld: 247 mg/dL — ABNORMAL HIGH (ref 70–99)
Potassium: 4.5 mmol/L (ref 3.5–5.1)
Sodium: 142 mmol/L (ref 135–145)
Total Bilirubin: 1.3 mg/dL — ABNORMAL HIGH (ref 0.3–1.2)
Total Protein: 7.3 g/dL (ref 6.5–8.1)

## 2019-03-31 LAB — I-STAT CHEM 8, ED
BUN: 25 mg/dL — ABNORMAL HIGH (ref 8–23)
Calcium, Ion: 1.1 mmol/L — ABNORMAL LOW (ref 1.15–1.40)
Chloride: 106 mmol/L (ref 98–111)
Creatinine, Ser: 1.9 mg/dL — ABNORMAL HIGH (ref 0.61–1.24)
Glucose, Bld: 235 mg/dL — ABNORMAL HIGH (ref 70–99)
HCT: 47 % (ref 39.0–52.0)
Hemoglobin: 16 g/dL (ref 13.0–17.0)
Potassium: 4.4 mmol/L (ref 3.5–5.1)
Sodium: 143 mmol/L (ref 135–145)
TCO2: 26 mmol/L (ref 22–32)

## 2019-03-31 LAB — LACTIC ACID, PLASMA
Lactic Acid, Venous: 4.7 mmol/L (ref 0.5–1.9)
Lactic Acid, Venous: 2.3 mmol/L (ref 0.5–1.9)

## 2019-03-31 LAB — CBC WITH DIFFERENTIAL/PLATELET
Abs Immature Granulocytes: 0.07 10*3/uL (ref 0.00–0.07)
Basophils Absolute: 0 10*3/uL (ref 0.0–0.1)
Basophils Relative: 0 %
Eosinophils Absolute: 0.1 10*3/uL (ref 0.0–0.5)
Eosinophils Relative: 1 %
HCT: 44.6 % (ref 39.0–52.0)
Hemoglobin: 15 g/dL (ref 13.0–17.0)
Immature Granulocytes: 1 %
Lymphocytes Relative: 18 %
Lymphs Abs: 2 10*3/uL (ref 0.7–4.0)
MCH: 42.6 pg — ABNORMAL HIGH (ref 26.0–34.0)
MCHC: 33.6 g/dL (ref 30.0–36.0)
MCV: 126.7 fL — ABNORMAL HIGH (ref 80.0–100.0)
Monocytes Absolute: 0.5 10*3/uL (ref 0.1–1.0)
Monocytes Relative: 4 %
Neutro Abs: 8.4 10*3/uL — ABNORMAL HIGH (ref 1.7–7.7)
Neutrophils Relative %: 76 %
Platelets: 201 10*3/uL (ref 150–400)
RBC: 3.52 MIL/uL — ABNORMAL LOW (ref 4.22–5.81)
RDW: 14.7 % (ref 11.5–15.5)
WBC: 11.1 10*3/uL — ABNORMAL HIGH (ref 4.0–10.5)
nRBC: 0.2 % (ref 0.0–0.2)

## 2019-03-31 LAB — PROTIME-INR
INR: 1.7 — ABNORMAL HIGH (ref 0.8–1.2)
Prothrombin Time: 19.6 seconds — ABNORMAL HIGH (ref 11.4–15.2)

## 2019-03-31 LAB — CBG MONITORING, ED: Glucose-Capillary: 135 mg/dL — ABNORMAL HIGH (ref 70–99)

## 2019-03-31 LAB — TROPONIN I (HIGH SENSITIVITY)
Troponin I (High Sensitivity): 15 ng/L (ref <18)
Troponin I (High Sensitivity): 36 ng/L — ABNORMAL HIGH (ref <18)

## 2019-03-31 LAB — GLUCOSE, CAPILLARY
Glucose-Capillary: 127 mg/dL — ABNORMAL HIGH (ref 70–99)
Glucose-Capillary: 137 mg/dL — ABNORMAL HIGH (ref 70–99)
Glucose-Capillary: 141 mg/dL — ABNORMAL HIGH (ref 70–99)
Glucose-Capillary: 146 mg/dL — ABNORMAL HIGH (ref 70–99)

## 2019-03-31 LAB — BRAIN NATRIURETIC PEPTIDE: B Natriuretic Peptide: 1338.6 pg/mL — ABNORMAL HIGH (ref 0.0–100.0)

## 2019-03-31 LAB — RAPID URINE DRUG SCREEN, HOSP PERFORMED
Amphetamines: NOT DETECTED
Barbiturates: NOT DETECTED
Benzodiazepines: NOT DETECTED
Cocaine: NOT DETECTED
Opiates: NOT DETECTED
Tetrahydrocannabinol: NOT DETECTED

## 2019-03-31 LAB — MRSA PCR SCREENING: MRSA by PCR: NEGATIVE

## 2019-03-31 LAB — SARS CORONAVIRUS 2 BY RT PCR (HOSPITAL ORDER, PERFORMED IN ~~LOC~~ HOSPITAL LAB): SARS Coronavirus 2: NEGATIVE

## 2019-03-31 LAB — PROCALCITONIN: Procalcitonin: 0.7 ng/mL

## 2019-03-31 LAB — ETHANOL: Alcohol, Ethyl (B): <10 mg/dL (ref <10)

## 2019-03-31 MED ORDER — FAMOTIDINE IN NACL 20-0.9 MG/50ML-% IV SOLN: 20.0000 mg | Freq: Two times a day (BID) | INTRAVENOUS | Status: DC

## 2019-03-31 MED ORDER — NITROGLYCERIN IN D5W 200-5 MCG/ML-% IV SOLN
0.0000 ug/min | INTRAVENOUS | Status: DC
  Filled 2019-03-31: qty 250

## 2019-03-31 MED ORDER — CHLORHEXIDINE GLUCONATE CLOTH 2 % EX PADS
6.0000 | MEDICATED_PAD | Freq: Every day | CUTANEOUS | Status: DC
  Administered 2019-03-31 – 2019-04-03 (×5): 6 via TOPICAL

## 2019-03-31 MED ORDER — INSULIN ASPART 100 UNIT/ML ~~LOC~~ SOLN
0.0000 [IU] | SUBCUTANEOUS | Status: DC
  Administered 2019-03-31 (×4): 2 [IU] via SUBCUTANEOUS
  Administered 2019-04-01 (×2): 3 [IU] via SUBCUTANEOUS
  Administered 2019-04-01: 12:00:00 2 [IU] via SUBCUTANEOUS

## 2019-03-31 MED ORDER — FENTANYL CITRATE (PF) 100 MCG/2ML IJ SOLN
25.0000 ug | Freq: Once | INTRAMUSCULAR | Status: AC
  Administered 2019-03-31: 25 ug via INTRAVENOUS
  Filled 2019-03-31: qty 2

## 2019-03-31 MED ORDER — CHLORHEXIDINE GLUCONATE 0.12% ORAL RINSE (MEDLINE KIT)
15.0000 mL | Freq: Two times a day (BID) | OROMUCOSAL | Status: DC
  Administered 2019-03-31 – 2019-04-01 (×2): 15 mL via OROMUCOSAL

## 2019-03-31 MED ORDER — PANTOPRAZOLE SODIUM 40 MG PO PACK
40.0000 mg | PACK | Freq: Every day | ORAL | Status: DC
  Filled 2019-03-31 (×2): qty 20

## 2019-03-31 MED ORDER — PIPERACILLIN-TAZOBACTAM 3.375 G IVPB
3.3750 g | Freq: Three times a day (TID) | INTRAVENOUS | Status: DC
  Administered 2019-03-31 – 2019-04-01 (×3): 3.375 g via INTRAVENOUS
  Filled 2019-03-31 (×4): qty 50

## 2019-03-31 MED ORDER — PROPOFOL 1000 MG/100ML IV EMUL
5.0000 ug/kg/min | INTRAVENOUS | Status: DC
  Administered 2019-03-31: 5 ug/kg/min via INTRAVENOUS
  Filled 2019-03-31: qty 100

## 2019-03-31 MED ORDER — ORAL CARE MOUTH RINSE
15.0000 mL | OROMUCOSAL | Status: DC
  Administered 2019-03-31 – 2019-04-01 (×6): 15 mL via OROMUCOSAL

## 2019-03-31 MED ORDER — IPRATROPIUM-ALBUTEROL 0.5-2.5 (3) MG/3ML IN SOLN
3.0000 mL | Freq: Four times a day (QID) | RESPIRATORY_TRACT | Status: DC
  Administered 2019-03-31 – 2019-04-01 (×4): 3 mL via RESPIRATORY_TRACT
  Filled 2019-03-31 (×5): qty 3

## 2019-03-31 MED ORDER — AMIODARONE HCL 200 MG PO TABS
100.0000 mg | ORAL_TABLET | Freq: Every day | ORAL | Status: DC
  Administered 2019-03-31 – 2019-04-04 (×5): 100 mg via ORAL
  Filled 2019-03-31 (×4): qty 1

## 2019-03-31 MED ORDER — MAGNESIUM SULFATE 2 GM/50ML IV SOLN: 2.0000 g | Freq: Once | INTRAVENOUS | Status: DC

## 2019-03-31 MED ORDER — ETOMIDATE 2 MG/ML IV SOLN
30.0000 mg | Freq: Once | INTRAVENOUS | Status: AC
  Administered 2019-03-31: 30 mg via INTRAVENOUS
  Filled 2019-03-31: qty 20

## 2019-03-31 MED ORDER — SUCCINYLCHOLINE CHLORIDE 20 MG/ML IJ SOLN
120.0000 mg | Freq: Once | INTRAMUSCULAR | Status: AC
  Administered 2019-03-31: 120 mg via INTRAVENOUS
  Filled 2019-03-31: qty 6

## 2019-03-31 MED ORDER — WARFARIN SODIUM 2.5 MG PO TABS
3.7500 mg | ORAL_TABLET | Freq: Once | ORAL | Status: AC
  Administered 2019-03-31: 3.75 mg via ORAL
  Filled 2019-03-31 (×2): qty 1

## 2019-03-31 MED ORDER — METHYLPREDNISOLONE SODIUM SUCC 125 MG IJ SOLR
125.0000 mg | Freq: Once | INTRAMUSCULAR | Status: AC
  Administered 2019-03-31: 125 mg via INTRAVENOUS
  Filled 2019-03-31: qty 2

## 2019-03-31 MED ORDER — FUROSEMIDE 10 MG/ML IJ SOLN
40.0000 mg | Freq: Once | INTRAMUSCULAR | Status: AC
  Administered 2019-03-31: 40 mg via INTRAVENOUS
  Filled 2019-03-31: qty 4

## 2019-03-31 MED ORDER — PRO-STAT SUGAR FREE PO LIQD
30.0000 mL | Freq: Three times a day (TID) | ORAL | Status: DC
  Administered 2019-03-31 (×2): 30 mL
  Filled 2019-03-31 (×2): qty 30

## 2019-03-31 MED ORDER — ENOXAPARIN SODIUM 40 MG/0.4ML ~~LOC~~ SOLN
40.0000 mg | SUBCUTANEOUS | Status: DC
  Administered 2019-03-31: 22:00:00 40 mg via SUBCUTANEOUS
  Filled 2019-03-31: qty 0.4

## 2019-03-31 MED ORDER — SUCCINYLCHOLINE CHLORIDE 20 MG/ML IJ SOLN
1.0000 mg/kg | Freq: Once | INTRAMUSCULAR | Status: DC
  Filled 2019-03-31: qty 5.08

## 2019-03-31 MED ORDER — VITAL HIGH PROTEIN PO LIQD
1000.0000 mL | ORAL | Status: DC
  Administered 2019-03-31: 19:00:00 1000 mL

## 2019-03-31 MED ORDER — FENTANYL BOLUS VIA INFUSION
25.0000 ug | INTRAVENOUS | Status: DC | PRN
  Administered 2019-03-31 – 2019-04-01 (×3): 25 ug via INTRAVENOUS
  Filled 2019-03-31: qty 25

## 2019-03-31 MED ORDER — ALBUTEROL (5 MG/ML) CONTINUOUS INHALATION SOLN
10.0000 mg/h | INHALATION_SOLUTION | Freq: Once | RESPIRATORY_TRACT | Status: AC
  Administered 2019-03-31: 10 mg/h via RESPIRATORY_TRACT
  Filled 2019-03-31 (×2): qty 20

## 2019-03-31 MED ORDER — FUROSEMIDE 10 MG/ML IJ SOLN
40.0000 mg | Freq: Three times a day (TID) | INTRAMUSCULAR | Status: DC
  Administered 2019-03-31 – 2019-04-01 (×3): 40 mg via INTRAVENOUS
  Filled 2019-03-31 (×3): qty 4

## 2019-03-31 MED ORDER — WARFARIN - PHARMACIST DOSING INPATIENT: Freq: Every day | Status: DC

## 2019-03-31 MED ORDER — PIPERACILLIN-TAZOBACTAM 3.375 G IVPB 30 MIN: 3.3750 g | Freq: Three times a day (TID) | INTRAVENOUS | Status: DC

## 2019-03-31 MED ORDER — FENTANYL 2500MCG IN NS 250ML (10MCG/ML) PREMIX INFUSION
25.0000 ug/h | INTRAVENOUS | Status: DC
  Administered 2019-03-31: 25 ug/h via INTRAVENOUS
  Administered 2019-04-01: 150 ug/h via INTRAVENOUS
  Filled 2019-03-31 (×2): qty 250

## 2019-03-31 MED ORDER — NOREPINEPHRINE 4 MG/250ML-% IV SOLN
0.0000 ug/min | INTRAVENOUS | Status: DC
  Administered 2019-03-31 – 2019-04-01 (×2): 2 ug/min via INTRAVENOUS
  Filled 2019-03-31: qty 250

## 2019-03-31 MED ORDER — METHYLPREDNISOLONE SODIUM SUCC 40 MG IJ SOLR
40.0000 mg | Freq: Two times a day (BID) | INTRAMUSCULAR | Status: DC
  Administered 2019-03-31: 40 mg via INTRAVENOUS
  Filled 2019-03-31 (×2): qty 1

## 2019-03-31 MED ORDER — FENTANYL 2500MCG IN NS 250ML (10MCG/ML) PREMIX INFUSION: 0.0000 ug/h | INTRAVENOUS | Status: DC

## 2019-03-31 NOTE — ED Notes (Signed)
Ryan Chen, Rn given report on 69M

## 2019-03-31 NOTE — Progress Notes (Signed)
Initial Nutrition Assessment RD working remotely.  DOCUMENTATION CODES:   Obesity unspecified  INTERVENTION:    Vital High Protein at 45 ml/h (1080 ml per day)   Pro-stat 30 ml TID   Provides 1380 kcal, 140 gm protein, 903 ml free water daily  NUTRITION DIAGNOSIS:   Inadequate oral intake related to inability to eat as evidenced by NPO status.  GOAL:   Provide needs based on ASPEN/SCCM guidelines  MONITOR:   Vent status, TF tolerance, Labs, I & O's  REASON FOR ASSESSMENT:   Consult Enteral/tube feeding initiation and management  ASSESSMENT:   73 yo male admitted with acute respiratory failure likely due to CHF exacerbation. PMH includes cardiomyopathy, morbid obesity, substance abuse on suboxone, COPD, CKD.   Patient was intubated in the ED. Received MD Consult for TF initiation and management. OG tube in place.   Patient is currently intubated on ventilator support MV: 10.8 L/min Temp (24hrs), Avg:97.2 F (36.2 C), Min:97.2 F (36.2 C), Max:97.2 F (36.2 C)  Propofol: off   Labs reviewed. BUN 25 (H), creatinine 1.9 (H) CBG: 135  Medications reviewed and include lasix, novolog, solumedrol, levophed.   NUTRITION - FOCUSED PHYSICAL EXAM:  unable to complete  Diet Order:   Diet Order            Diet NPO time specified  Diet effective now              EDUCATION NEEDS:   Not appropriate for education at this time  Skin:  Skin Assessment: Reviewed RN Assessment  Last BM:  No BM documented  Height:   Ht Readings from Last 1 Encounters:  03/31/19 5\' 8"  (1.727 m)    Weight:   Wt Readings from Last 1 Encounters:  03/31/19 101.6 kg    Ideal Body Weight:  70 kg  BMI:  Body mass index is 34.06 kg/m.  Estimated Nutritional Needs:   Kcal:  [id number]  Protein:  140 gm  Fluid:  >/= 1.8 L    Molli Barrows, RD, LDN, [place] Pager [phone] After Hours Pager [phone]

## 2019-03-31 NOTE — ED Notes (Signed)
ED TO INPATIENT HANDOFF REPORT  ED Nurse Name and Phone #: Lorrin Goodell [phone]  S Name/Age/Gender [name] 73 y.o. male Room/Bed: 021C/021C  Code Status   Code Status: Full Code  Home/SNF/Other Home  Is this baseline? No   Triage Complete: Triage complete  Chief Complaint resp distress  Triage Note Pt arrived via [facility]. Pt was getting out of vehicle and fell and was in respiratory distress. A bystander called EMS. EMS found pt was cyanotic, had labored breathing. EMS gave albuterol neb, magnesium 1g, IM epi 0.3mg x2. Pt arrived on cpap from EMS w/labored breathing using accessory muscles. Pt is tachy on monitor    Allergies Allergies  Allergen Reactions  . Codeine Shortness Of Breath and Rash    Level of Care/Admitting Diagnosis ED Disposition    ED Disposition Condition [facility] Area: [place] [[id number]]  Level of Care: ICU [6]  Covid Evaluation: Confirmed COVID Negative  Diagnosis: Acute respiratory failure with hypoxia [facility]) [id number]  Admitting Physician: Candee Furbish [id number]  Attending Physician: Candee Furbish [id number]  Estimated length of stay: 3 - 4 days  Certification:: I certify this patient will need inpatient services for at least 2 midnights  PT Class (Do Not Modify): Inpatient [101]  PT Acc Code (Do Not Modify): Private [1]       B Medical/Surgery History Past Medical History:  Diagnosis Date  . Acute lower GI bleeding 02/15/2017  . Atrial fibrillation ([place])   . Cardiomyopathy    EF55% 11/14<<35%   . CHF (congestive heart failure) ([place])   . COPD (chronic obstructive pulmonary disease) ([place])   . Essential hypertension   . GERD (gastroesophageal reflux disease)   . Gout   . Hepatitis    Possible history  . History of atrial flutter    Ablation 2012  . 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 Obesity   . Pneumonia X 1  . Primary polycythemia ([place]) 06/12/2013  . Renal insufficiency    . Substance abuse ([place])    History of alcohol; hx cocaine, heroin, crack use  . Syncope    Past Surgical History:  Procedure Laterality Date  . CARDIAC ELECTROPHYSIOLOGY MAPPING AND ABLATION  08/2010   [name] 09/07/2010 (12/14/2012)  . COLONOSCOPY     15-20 years ago had colon in [place]  . EXCISIONAL HEMORRHOIDECTOMY  1970's  . LOOP RECORDER IMPLANT N/A 08/23/2013   Procedure: LOOP RECORDER IMPLANT;  Surgeon: Deboraha Sprang, MD;  Location: [facility] CATH LAB;  Service: Cardiovascular;  Laterality: N/A;  . MULTIPLE EXTRACTIONS WITH ALVEOLOPLASTY Bilateral 01/24/2016   Procedure: MULTIPLE EXTRACTION WITH ALVEOLOPLASTY BILATERAL;  Surgeon: Diona Browner, DDS;  Location: [place];  Service: Oral Surgery;  Laterality: Bilateral;  . MULTIPLE TOOTH EXTRACTIONS  01/24/2016   MULTIPLE EXTRACTION WITH ALVEOLOPLASTY BILATERAL (Bilateral)     A IV Location/Drains/Wounds Patient Lines/Drains/Airways Status   Active Line/Drains/Airways    Name:   Placement date:   Placement time:   Site:   Days:   Peripheral IV 02/12/19 Right;Anterior Forearm   02/12/19    1841    Forearm   47   Peripheral IV 03/31/19 Left;Anterior Forearm   03/31/19    0927    Forearm   less than 1   Peripheral IV 03/31/19 Right Antecubital   03/31/19    -    Antecubital   less than 1   NG/OG Tube Orogastric Xray;Aucultation   02/09/19    1756    -  50   NG/OG Tube Orogastric 16 Fr. Center mouth Xray;Aucultation   03/31/19    [place] mouth   less than 1   Urethral Catheter Fayrene Fearing, RN Latex 16 Fr.   03/31/19    1052    Latex   less than 1   Airway 8 mm   03/31/19    1040     less than 1   Incision (Closed) 01/24/16 N/A Other (Comment)   01/24/16    1106     1162          Intake/Output Last 24 hours No intake or output data in the 24 hours ending 03/31/19 1325  Labs/Imaging Results for orders placed or performed during the hospital encounter of 03/31/19 (from the past 48 hour(s))  SARS Coronavirus 2 [facility] [place] [facility] order, Performed in  [facility] hospital lab) Nasopharyngeal Nasopharyngeal Swab     Status: None   Collection Time: 03/31/19  9:00 AM   Specimen: Nasopharyngeal Swab  Result Value Ref Range   SARS Coronavirus 2 NEGATIVE NEGATIVE    Comment: (NOTE) If result is NEGATIVE SARS-CoV-2 target nucleic acids are NOT DETECTED. The SARS-CoV-2 RNA is generally detectable in upper and lower  respiratory specimens during the acute phase of infection. The lowest  concentration of SARS-CoV-2 viral copies this assay can detect is 250  copies / mL. A negative result does not preclude SARS-CoV-2 infection  and should not be used as the sole basis for treatment or other  patient management decisions.  A negative result may occur with  improper specimen collection / handling, submission of specimen other  than nasopharyngeal swab, presence of viral mutation(s) within the  areas targeted by this assay, and inadequate number of viral copies  (<250 copies / mL). A negative result must be combined with clinical  observations, patient history, and epidemiological information. If result is POSITIVE SARS-CoV-2 target nucleic acids are DETECTED. The SARS-CoV-2 RNA is generally detectable in upper and lower  respiratory specimens dur ing the acute phase of infection.  Positive  results are indicative of active infection with SARS-CoV-2.  Clinical  correlation with patient history and other diagnostic information is  necessary to determine patient infection status.  Positive results do  not rule out bacterial infection or co-infection with other viruses. If result is PRESUMPTIVE POSTIVE SARS-CoV-2 nucleic acids MAY BE PRESENT.   A presumptive positive result was obtained on the submitted specimen  and confirmed on repeat testing.  While 2019 novel coronavirus  (SARS-CoV-2) nucleic acids may be present in the submitted sample  additional confirmatory testing may be necessary for epidemiological  and / or clinical management  purposes  to differentiate between  SARS-CoV-2 and other Sarbecovirus currently known to infect humans.  If clinically indicated additional testing with an alternate test  methodology [phone]) is advised. The SARS-CoV-2 RNA is generally  detectable in upper and lower respiratory sp ecimens during the acute  phase of infection. The expected result is Negative. Fact Sheet for Patients:  StrictlyIdeas.no Fact Sheet for Healthcare Providers: BankingDealers.co.za This test is not yet approved or cleared by the [place] FDA and has been authorized for detection and/or diagnosis of SARS-CoV-2 by FDA under an Emergency Use Authorization (EUA).  This EUA will remain in effect (meaning this test can be used) for the duration of the COVID-19 declaration under Section 564(b)(1) of the Act, 21 U.S.C. section 360bbb-3(b)(1), unless the authorization is terminated or revoked sooner. Performed at [facility], [address] [place], [place] [address]   Comprehensive metabolic panel     Status: Abnormal   Collection Time: 03/31/19  9:15 AM  Result Value Ref Range   Sodium 142 135 - 145 mmol/L   Potassium 4.5 3.5 - 5.1 mmol/L   Chloride 108 98 - 111 mmol/L   CO2 21 (L) 22 - 32 mmol/L   Glucose, Bld 247 (H) 70 - 99 mg/dL   BUN 22 8 - 23 mg/dL   Creatinine, Ser 1.84 (H) 0.61 - 1.24 mg/dL   Calcium 8.5 (L) 8.9 - 10.3 mg/dL   Total Protein 7.3 6.5 - 8.1 g/dL   Albumin 3.4 (L) 3.5 - 5.0 g/dL   AST 42 (H) 15 - 41 U/L   ALT 32 0 - 44 U/L   Alkaline Phosphatase 117 38 - 126 U/L   Total Bilirubin 1.3 (H) 0.3 - 1.2 mg/dL   GFR calc non Af Amer 36 (L) >60 mL/min   GFR calc Af Amer 42 (L) >60 mL/min   Anion gap 13 5 - 15    Comment: Performed at [facility], [place] [address], [place], [place] [address]  Troponin I (High Sensitivity)     Status: None   Collection Time: 03/31/19  9:15 AM  Result Value Ref Range   Troponin I (High  Sensitivity) 15 <18 ng/L    Comment: (NOTE) Elevated high sensitivity troponin I (hsTnI) values and significant  changes across serial measurements may suggest ACS but many other  chronic and acute conditions are known to elevate hsTnI results.  Refer to the "Links" section for chest pain algorithms and additional  guidance. Performed at [facility], [place] [address] [place], [place] [address]   Brain natriuretic peptide     Status: Abnormal   Collection Time: 03/31/19  9:15 AM  Result Value Ref Range   B Natriuretic Peptide 1,338.6 (H) 0.0 - 100.0 pg/mL    Comment: Performed at [place] [address] [place], [place] [address]  CBC with Differential     Status: Abnormal   Collection Time: 03/31/19  9:15 AM  Result Value Ref Range   WBC 11.1 (H) 4.0 - 10.5 K/uL   RBC 3.52 (L) 4.22 - 5.81 MIL/uL   Hemoglobin 15.0 13.0 - 17.0 g/dL   HCT 44.6 39.0 - 52.0 %   MCV 126.7 (H) 80.0 - 100.0 fL   MCH 42.6 (H) 26.0 - 34.0 pg   MCHC 33.6 30.0 - 36.0 g/dL   RDW 14.7 11.5 - 15.5 %   Platelets 201 150 - 400 K/uL   nRBC 0.2 0.0 - 0.2 %   Neutrophils Relative % 76 %   Neutro Abs 8.4 (H) 1.7 - 7.7 K/uL   Lymphocytes Relative 18 %   Lymphs Abs 2.0 0.7 - 4.0 K/uL   Monocytes Relative 4 %   Monocytes Absolute 0.5 0.1 - 1.0 K/uL   Eosinophils Relative 1 %   Eosinophils Absolute 0.1 0.0 - 0.5 K/uL   Basophils Relative 0 %   Basophils Absolute 0.0 0.0 - 0.1 K/uL   Immature Granulocytes 1 %   Abs Immature Granulocytes 0.07 0.00 - 0.07 K/uL    Comment: Performed at [facility], [place] [address] [place], [place] [address]  Protime-INR     Status: Abnormal   Collection Time: 03/31/19  9:15 AM  Result Value Ref Range   Prothrombin Time 19.6 (H) 11.4 - 15.2 seconds  INR 1.7 (H) 0.8 - 1.2    Comment: (NOTE) INR goal varies based on device and disease states. Performed at [facility], [place] [address] [place], [place] [address]   Lactic acid, plasma     Status:  Abnormal   Collection Time: 03/31/19  9:15 AM  Result Value Ref Range   Lactic Acid, Venous 4.7 (HH) 0.5 - 1.9 mmol/L    Comment: CRITICAL RESULT CALLED TO, READ BACK BY AND VERIFIED WITH: Corky Sing AT 1009 03/31/2019 BY ZBEECH. Performed at [facility], [name] [address] [place], [place] [address]   Ethanol     Status: None   Collection Time: 03/31/19  9:20 AM  Result Value Ref Range   Alcohol, Ethyl (B) <10 <10 mg/dL    Comment: (NOTE) Lowest detectable limit for serum alcohol is 10 mg/dL. For medical purposes only. Performed at [facility], [place] [address], [place], [place] [address]   I-stat chem 8, ED (not at [facility] or [facility])     Status: Abnormal   Collection Time: 03/31/19  9:23 AM  Result Value Ref Range   Sodium 143 135 - 145 mmol/L   Potassium 4.4 3.5 - 5.1 mmol/L   Chloride 106 98 - 111 mmol/L   BUN 25 (H) 8 - 23 mg/dL   Creatinine, Ser 1.90 (H) 0.61 - 1.24 mg/dL   Glucose, Bld 235 (H) 70 - 99 mg/dL   Calcium, Ion 1.10 (L) 1.15 - 1.40 mmol/L   TCO2 26 22 - 32 mmol/L   Hemoglobin 16.0 13.0 - 17.0 g/dL   HCT 47.0 39.0 - 52.0 %  Lactic acid, plasma     Status: Abnormal   Collection Time: 03/31/19 11:00 AM  Result Value Ref Range   Lactic Acid, Venous 2.3 (HH) 0.5 - 1.9 mmol/L    Comment: CRITICAL VALUE NOTED.  VALUE IS CONSISTENT WITH PREVIOUSLY REPORTED AND CALLED VALUE. Performed at [facility], [place] [address] [place], [place] [address]   Procalcitonin - Baseline     Status: None   Collection Time: 03/31/19 11:00 AM  Result Value Ref Range   Procalcitonin 0.70 ng/mL    Comment:        Interpretation: PCT > 0.5 ng/mL and <= 2 ng/mL: Systemic infection (sepsis) is possible, but other conditions are known to elevate PCT as well. (NOTE)       Sepsis PCT Algorithm           Lower Respiratory Tract                                      Infection PCT Algorithm    ----------------------------     ----------------------------         PCT < 0.25  ng/mL                PCT < 0.10 ng/mL         Strongly encourage             Strongly discourage   discontinuation of antibiotics    initiation of antibiotics    ----------------------------     -----------------------------       PCT 0.25 - 0.50 ng/mL            PCT 0.10 - 0.25 ng/mL               OR       >  80% decrease in PCT            Discourage initiation of                                            antibiotics      Encourage discontinuation           of antibiotics    ----------------------------     -----------------------------         PCT >= 0.50 ng/mL              PCT 0.26 - 0.50 ng/mL                AND       <80% decrease in PCT             Encourage initiation of                                             antibiotics       Encourage continuation           of antibiotics    ----------------------------     -----------------------------        PCT >= 0.50 ng/mL                  PCT > 0.50 ng/mL               AND         increase in PCT                  Strongly encourage                                      initiation of antibiotics    Strongly encourage escalation           of antibiotics                                     -----------------------------                                           PCT <= 0.25 ng/mL                                                 OR                                        > 80% decrease in PCT                                     Discontinue / Do not initiate  antibiotics Performed at [facility], [place] [address], [place], [place] [address]   Troponin I (High Sensitivity)     Status: Abnormal   Collection Time: 03/31/19 11:00 AM  Result Value Ref Range   Troponin I (High Sensitivity) 36 (H) <18 ng/L    Comment: RESULT CALLED TO, READ BACK BY AND VERIFIED WITH: Corky Sing AT 1215 03/31/2019 BY ZBEECH. (NOTE) Elevated high sensitivity troponin I (hsTnI) values and significant   changes across serial measurements may suggest ACS but many other  chronic and acute conditions are known to elevate hsTnI results.  Refer to the Links section for chest pain algorithms and additional  guidance. Performed at [facility], [place] [address] [place], [place] [address]   I-STAT 7, (LYTES, BLD GAS, ICA, H+H)     Status: Abnormal   Collection Time: 03/31/19 11:14 AM  Result Value Ref Range   pH, Arterial 7.280 (L) 7.350 - 7.450   pCO2 arterial 56.9 (H) 32.0 - 48.0 mmHg   pO2, Arterial 99.0 83.0 - 108.0 mmHg   Bicarbonate 26.7 20.0 - 28.0 mmol/L   TCO2 28 22 - 32 mmol/L   O2 Saturation 97.0 %   Acid-base deficit 1.0 0.0 - 2.0 mmol/L   Sodium 144 135 - 145 mmol/L   Potassium 4.5 3.5 - 5.1 mmol/L   Calcium, Ion 1.13 (L) 1.15 - 1.40 mmol/L   HCT 47.0 39.0 - 52.0 %   Hemoglobin 16.0 13.0 - 17.0 g/dL   Patient temperature 98.6 F    Collection site RADIAL, ALLEN'S TEST ACCEPTABLE    Sample type ARTERIAL   Urine rapid drug screen (hosp performed)     Status: None   Collection Time: 03/31/19 11:35 AM  Result Value Ref Range   Opiates NONE DETECTED NONE DETECTED   Cocaine NONE DETECTED NONE DETECTED   Benzodiazepines NONE DETECTED NONE DETECTED   Amphetamines NONE DETECTED NONE DETECTED   Tetrahydrocannabinol NONE DETECTED NONE DETECTED   Barbiturates NONE DETECTED NONE DETECTED    Comment: (NOTE) DRUG SCREEN FOR MEDICAL PURPOSES ONLY.  IF CONFIRMATION IS NEEDED FOR ANY PURPOSE, NOTIFY LAB WITHIN 5 DAYS. LOWEST DETECTABLE LIMITS FOR URINE DRUG SCREEN Drug Class                     Cutoff (ng/mL) Amphetamine and metabolites    1000 Barbiturate and metabolites    200 Benzodiazepine                 [id number] Tricyclics and metabolites     300 Opiates and metabolites        300 Cocaine and metabolites        300 THC                            50 Performed at [facility], [place] [address], [place], [place] [address]   CBG monitoring, ED     Status: Abnormal    Collection Time: 03/31/19 12:19 PM  Result Value Ref Range   Glucose-Capillary 135 (H) 70 - 99 mg/dL   Comment 1 Notify RN    Comment 2 Document in Chart    Dg Chest Portable 1 View  Result Date: 03/31/2019 CLINICAL DATA:  Intubation EXAM: PORTABLE CHEST 1 VIEW COMPARISON:  03/31/2019, 9:16 a.m. FINDINGS: Interval placement of endotracheal tube, tip positioned above the carina. Esophagogastric tube placement with tip and side port below the diaphragm. Mild  cardiomegaly. Redemonstrated pulmonary vascular prominence with interstitial and heterogeneous airspace opacity, most conspicuous in the right midlung. IMPRESSION: Interval placement of endotracheal tube, tip positioned above the carina. Esophagogastric tube placement with tip and side port below the diaphragm. Electronically Signed   By: Eddie Candle M.D.   On: 03/31/2019 11:25   Dg Chest Portable 1 View  Result Date: 03/31/2019 CLINICAL DATA:  Respiratory distress EXAM: PORTABLE CHEST 1 VIEW COMPARISON:  February 11, 2019 FINDINGS: There is cardiomegaly with pulmonary venous hypertension. There is interstitial edema throughout the lungs with patchy airspace opacity in the mid to lower lung regions, likely alveolar edema. There is a small right pleural effusion. No adenopathy evident. Loop recorder noted on the left.  No bone lesions.  No pneumothorax. IMPRESSION: Pulmonary vascular congestion with interstitial and patchy alveolar edema. Small right pleural effusion. These findings are indicative of congestive heart failure. A degree of superimposed pneumonia in the bases cannot be entirely excluded by radiography. Electronically Signed   By: Lowella Grip III M.D.   On: 03/31/2019 09:37    Pending Labs Unresulted Labs (From admission, onward)    Start     Ordered   04/07/19 0500  Creatinine, serum  (enoxaparin (LOVENOX)    CrCl >/= 30 ml/min)  Weekly,   R    Comments: while on enoxaparin therapy    03/31/19 1100   04/01/19 0500  CBC   Tomorrow morning,   R     03/31/19 1100   04/01/19 [id number]  Basic metabolic panel  Tomorrow morning,   R     03/31/19 1100   04/01/19 0500  Blood gas, arterial  Tomorrow morning,   R     03/31/19 1100   04/01/19 0500  Magnesium  Tomorrow morning,   R     03/31/19 1100   04/01/19 0500  Protime-INR  Daily,   R     03/31/19 1204   03/31/19 1137  Culture, respiratory (non-expectorated)  Once,   STAT    Question:  Patient immune status  Answer:  Immunocompromised   03/31/19 1136   03/31/19 0914  Culture, blood (routine x 2)  BLOOD CULTURE X 2,   STAT     03/31/19 0914          Vitals/Pain Today's Vitals   03/31/19 1245 03/31/19 1300 03/31/19 1310 03/31/19 1312  BP: (!) 73/55 (!) 72/53 (!) 84/61   Pulse: 61 (!) 50  (!) 57  Resp: 20 20 15 14   SpO2: 97% 97%  100%  Weight:      Height:      PainSc:        Isolation Precautions No active isolations  Medications Medications  propofol (DIPRIVAN) 1000 MG/100ML infusion (10 mcg/kg/min  101.6 kg Intravenous Rate/Dose Change 03/31/19 1131)  enoxaparin (LOVENOX) injection 40 mg (has no administration in time range)  insulin aspart (novoLOG) injection 0-15 Units (has no administration in time range)  fentaNYL 2581mcg in NS 245mL (38mcg/ml) infusion-PREMIX (has no administration in time range)  fentaNYL (SUBLIMAZE) bolus via infusion 25 mcg (has no administration in time range)  amiodarone (PACERONE) tablet 100 mg (has no administration in time range)  ipratropium-albuterol (DUONEB) 0.5-2.5 (3) MG/3ML nebulizer solution 3 mL (has no administration in time range)  furosemide (LASIX) injection 40 mg (has no administration in time range)  methylPREDNISolone sodium succinate (SOLU-MEDROL) 40 mg/mL injection 40 mg (has no administration in time range)  pantoprazole sodium (PROTONIX) 40 mg/20 mL oral suspension 40 mg (has  no administration in time range)  piperacillin-tazobactam (ZOSYN) IVPB 3.375 g (3.375 g Intravenous New Bag/Given 03/31/19 1228)   Warfarin - Pharmacist Dosing Inpatient (has no administration in time range)  norepinephrine (LEVOPHED) 4mg  in 243mL premix infusion (2 mcg/min Intravenous New Bag/Given 03/31/19 1323)  warfarin (COUMADIN) tablet 3.75 mg (has no administration in time range)  methylPREDNISolone sodium succinate (SOLU-MEDROL) 125 mg/2 mL injection 125 mg (125 mg Intravenous Given 03/31/19 0929)  albuterol (PROVENTIL,VENTOLIN) solution continuous neb (10 mg/hr Nebulization Given 03/31/19 0958)  furosemide (LASIX) injection 40 mg (40 mg Intravenous Given 03/31/19 0957)  etomidate (AMIDATE) injection 30 mg (30 mg Intravenous Given 03/31/19 1033)  succinylcholine (ANECTINE) injection 120 mg (120 mg Intravenous Given 03/31/19 1028)  fentaNYL (SUBLIMAZE) injection 25 mcg (25 mcg Intravenous Given 03/31/19 1302)    Mobility walks     Focused Assessments Pulmonary Assessment Handoff:  Lung sounds: Bilateral Breath Sounds: Expiratory wheezes O2 Device: Bi-PAP        R Recommendations: See Admitting Provider Note  Report given to:   Additional Notes:

## 2019-03-31 NOTE — Progress Notes (Signed)
Welcome for warfarin Indication: atrial fibrillation   Assessment: 89 yom on warfarin PTA for afib presenting with acute hypoxemic respiratory failure likely secondary to CHF exacerbation. Pharmacy consulted to continue warfarin inpatient. INR subtherapeutic at 1.7 on admit. CBC wnl. Noted hx GIB in 2018, but no active bleed issues documented. Continues on PTA amiodarone.  PTA warfarin dose per last outpatient anticoag clinic note: 2.5mg  daily except 1.25mg  on Mon/Thurs (last dose unknown PTA)  Goal of Therapy:  INR 2-3 Monitor platelets by anticoagulation protocol: Yes   Plan:  Warfarin 3.75 mg PT x 1 dose Daily INR Monitor CBC, s/sx bleeding  Elicia Lamp, PharmD, BCPS Clinical Pharmacist 03/31/2019 11:58 AM

## 2019-03-31 NOTE — ED Notes (Addendum)
1030 Pt intubated w/endotrach size 8.0 23 at the lips

## 2019-03-31 NOTE — Procedures (Signed)
Central Venous Catheter Insertion Procedure Note [name] [id number] 1946-03-21  Procedure: Insertion of Central Venous Catheter Indications: Drug and/or fluid administration  Procedure Details Consent: Risks of procedure as well as the alternatives and risks of each were explained to the (patient/caregiver).  Consent for procedure obtained. Time Out: Verified patient identification, verified procedure, site/side was marked, verified correct patient position, special equipment/implants available, medications/allergies/relevent history reviewed, required imaging and test results available.  Performed  Maximum sterile technique was used including antiseptics, cap, gloves, gown, hand hygiene, mask and sheet. Skin prep: Chlorhexidine; local anesthetic administered A antimicrobial bonded/coated triple lumen catheter was placed in the left internal jugular vein using the Seldinger technique and verified with [place].  Evaluation Blood flow good Complications: No apparent complications Patient did tolerate procedure well. Chest X-ray ordered to verify placement.  CXR: pending.  Otilio Carpen Samuel Mcpeek 03/31/2019, 4:17 PM

## 2019-03-31 NOTE — Procedures (Signed)
Intubation Procedure Note [name] [id number] 11-28-1945  Procedure: Intubation Indications: Respiratory insufficiency  Procedure Details Consent: Unable to obtain consent because of altered level of consciousness. Time Out: Verified patient identification, verified procedure, site/side was marked, verified correct patient position, special equipment/implants available, medications/allergies/relevent history reviewed, required imaging and test results available.  Performed  Maximum sterile technique was used including gloves, gown, hand hygiene and mask.  MAC and 3    Evaluation Hemodynamic Status: BP stable throughout; O2 sats: stable throughout Patient's Current Condition: stable Complications: No apparent complications Patient did tolerate procedure well. Chest X-ray ordered to verify placement.  CXR: pending.   [name] 03/31/2019

## 2019-03-31 NOTE — ED Provider Notes (Addendum)
[facility] Provider Note   CSN: [id number] Arrival date & time: 03/31/19  0900   LEVEL 5 CAVEAT - RESPIRATORY DISTRESS  History   Chief Complaint Chief Complaint  Patient presents with  . Respiratory Distress    HPI Trasean Holton is a 73 y.o. male.     HPI  73 year old male presents with respiratory distress.  History very limited as the patient is on CPAP and not talking much.  EMS provides most of the history which is that the patient started feeling bad yesterday and then seemed to fall after getting out of the car today.  He was cyanotic on their arrival and they immediately placed him on oxygen.  Gave him albuterol and placed him on CPAP.  Working very hard to breathe.  Unclear if he sustained a significant injury or not.  They also gave him 1 g IV magnesium and 2 doses of IM epinephrine.  Past Medical History:  Diagnosis Date  . Acute lower GI bleeding 02/15/2017  . Atrial fibrillation ([place])   . Cardiomyopathy    EF55% 11/14<<35%   . CHF (congestive heart failure) ([place])   . COPD (chronic obstructive pulmonary disease) ([place])   . Essential hypertension   . GERD (gastroesophageal reflux disease)   . Gout   . Hepatitis    Possible history  . History of atrial flutter    Ablation 2012  . 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 Obesity   . Pneumonia X 1  . Primary polycythemia ([place]) 06/12/2013  . Renal insufficiency   . Substance abuse ([place])    History of alcohol; hx cocaine, heroin, crack use  . Syncope     Patient Active Problem List   Diagnosis Date Noted  . Acute respiratory failure with hypoxia ([place]) 03/31/2019  . Acute hypoxemic respiratory failure ([place]) 02/09/2019  . COPD exacerbation ([place]) 04/12/2018  . Elevated troponin 04/12/2018  . Long term current use of amiodarone 01/24/2018  . GI bleed 02/15/2017  . Pancytopenia, acquired ([place]) 11/26/2016  . Tubular adenoma 11/25/2016  . Gout 11/02/2016   . Preventive measure 05/25/2016  . Hepatitis C antibody test positive   . Hematuria 11/09/2014  . Acute kidney injury ([place]) 11/01/2014  . Leukopenia due to antineoplastic chemotherapy ([place]) 08/24/2014  . Thrombocytopenia due to drugs 07/25/2014  . Encounter for screening colonoscopy 07/02/2014  . Chronic anticoagulation 07/02/2014  . Acute pulmonary edema ([place]) 06/05/2014  . Prolonged QT interval 06/05/2014  . Sinus bradycardia 06/05/2014  . Opioid dependence ([place]) 06/05/2014  . Diastolic dysfunction [id number]  . COPD (chronic obstructive pulmonary disease) ([place]) 05/30/2014  . Health care maintenance 05/24/2014  . Anemia due to antineoplastic chemotherapy 04/24/2014  . Thyroid mass 08/26/2013  . Syncope 08/22/2013  . 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 Polycythemia vera ([place]) 01/03/2013  . Malnutrition of moderate degree ([place]) 12/15/2012  . Tobacco abuse 09/15/2012  . 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 Alcoholism ([place]) 09/30/2010  . Hypertension 09/30/2010  . Atrial fibrillation [facility])     Past Surgical History:  Procedure Laterality Date  . CARDIAC ELECTROPHYSIOLOGY MAPPING AND ABLATION  08/2010   [name] 09/07/2010 (12/14/2012)  . COLONOSCOPY     15-20 years ago had colon in [place]  . EXCISIONAL HEMORRHOIDECTOMY  1970's  . LOOP RECORDER IMPLANT N/A 08/23/2013   Procedure: LOOP RECORDER IMPLANT;  Surgeon: Deboraha Sprang, MD;  Location: [facility] CATH LAB;  Service: Cardiovascular;  Laterality: N/A;  .  MULTIPLE EXTRACTIONS WITH ALVEOLOPLASTY Bilateral 01/24/2016   Procedure: MULTIPLE EXTRACTION WITH ALVEOLOPLASTY BILATERAL;  Surgeon: Diona Browner, DDS;  Location: [facility];  Service: Oral Surgery;  Laterality: Bilateral;  . MULTIPLE TOOTH EXTRACTIONS  01/24/2016   MULTIPLE EXTRACTION WITH ALVEOLOPLASTY BILATERAL (Bilateral)        Home Medications    Prior to Admission medications   Medication Sig Start Date End Date Taking? Authorizing Provider   albuterol (PROAIR HFA) 108 (90 Base) MCG/ACT inhaler Inhale 1-2 puffs into the lungs every 6 (six) hours as needed for wheezing or shortness of breath. 01/11/19   [name], MD  albuterol (PROVENTIL) (2.5 MG/3ML) 0.083% nebulizer solution Take 3 mLs (2.5 mg total) by nebulization 2 (two) times daily as needed for wheezing or shortness of breath. 02/13/19   Kayleen Memos, DO  amiodarone (PACERONE) 200 MG tablet Take 0.5 tablets (100 mg total) by mouth daily. 06/20/18   Patsey Berthold, NP  camphor-menthol [facility]) lotion Apply topically 3 (three) times daily. 02/13/19   Kayleen Memos, DO  carvedilol (COREG) 3.125 MG tablet Take 1 tablet (3.125 mg total) by mouth daily. 03/29/19   Marianna Payment, MD  Fluticasone-Umeclidin-Vilant (TRELEGY ELLIPTA) 100-62.5-25 MCG/INH AEPB Inhale 1 puff into the lungs daily. 01/11/19   [name], MD  folic acid (FOLVITE) 1 MG tablet Take 1 tablet (1 mg total) by mouth daily. 09/02/18   [name], MD  furosemide (LASIX) 20 MG tablet Take 1 tablet (20 mg total) by mouth 2 (two) times daily. 03/30/19   Seiler, Amber K, NP  hydroxyurea (HYDREA) 500 MG capsule Take 2 capsules (1,000 mg total) by mouth daily. MAY TAKE WITH FOOD TO MINIMIZE GI SIDE EFFECTS Patient taking differently: Take 1,000 mg by mouth daily.  05/20/18   [name], MD  SUBOXONE 8-2 MG FILM Place 1 Film under the tongue daily.  08/21/15   [provider]  warfarin (COUMADIN) 2.5 MG tablet TAKE 1/2 TABLET BY MOUTH ON TUESDAY AND FRIDAY. TAKE 1 TABLET DAILY ON THE OTHER DAYS Patient taking differently: Take 1.25-2.5 mg by mouth See admin instructions. Per 12/27/18 visit anticoagulant dose Take 1.25 mg on Tuesday and Thursday 2.5 mg all the other days 01/30/19   Aldine Contes, MD    Family History Family History  Problem Relation Age of Onset  . Diabetes Mother   . Hypertension Mother   . Cirrhosis Father   . Alcohol abuse Father   . Colon cancer Neg Hx   . Rectal cancer Neg Hx   .  Stomach cancer Neg Hx     Social History Social History   Tobacco Use  . Smoking status: Current Every Day Smoker    Packs/day: 0.10    Years: 50.00    Pack years: 5.00    Types: Cigarettes  . Smokeless tobacco: Never Used  . Tobacco comment: 2-3 cigs per day  Substance Use Topics  . Alcohol use: Yes    Alcohol/week: 89.0 standard drinks    Types: 12 Cans of beer, 77 Shots of liquor per week    Comment: Sometimes.  . Drug use: Not Currently    Types: "Crack" cocaine, Cocaine, Heroin    Comment: 02/15/2017 "last used crack 4-5 years ago; last used heroin a couple years ago; currently denies any drug use-- on suboxone.      Allergies   Codeine   Review of Systems Review of Systems  Unable to perform ROS: Severe respiratory distress  Physical Exam Updated Vital Signs BP (!) 161/85   Pulse 92   Resp (!) 23   Ht 5\' 8"  (1.727 m)   Wt 101.6 kg   SpO2 92%   BMI 34.06 kg/m   Physical Exam Vitals signs and nursing note reviewed.  Constitutional:      General: He is in acute distress.     Appearance: He is well-developed. He is obese. He is ill-appearing and diaphoretic.  HENT:     Head: Normocephalic and atraumatic.     Right Ear: External ear normal.     Left Ear: External ear normal.     Nose: Nose normal.  Eyes:     General:        Right eye: No discharge.        Left eye: No discharge.  Neck:     Musculoskeletal: Neck supple.  Cardiovascular:     Rate and Rhythm: Normal rate and regular rhythm.     Heart sounds: Normal heart sounds.  Pulmonary:     Effort: Tachypnea, accessory muscle usage and prolonged expiration present.     Breath sounds: Decreased breath sounds and wheezing present.  Abdominal:     Palpations: Abdomen is soft.     Tenderness: There is no abdominal tenderness.  Musculoskeletal:     Right lower leg: Edema present.     Left lower leg: Edema present.     Comments: Trace edema in extremities. Small abrasion to left knee  Skin:     General: Skin is warm.  Neurological:     Mental Status: He is alert.  Psychiatric:        Mood and Affect: Mood is not anxious.      ED Treatments / Results  Labs (all labs ordered are listed, but only abnormal results are displayed) Labs Reviewed  CBC WITH DIFFERENTIAL/PLATELET - Abnormal; Notable for the following components:      Result Value   WBC 11.1 (*)    RBC 3.52 (*)    MCV 126.7 (*)    MCH 42.6 (*)    All other components within normal limits  PROTIME-INR - Abnormal; Notable for the following components:   Prothrombin Time 19.6 (*)    INR 1.7 (*)    All other components within normal limits  LACTIC ACID, PLASMA - Abnormal; Notable for the following components:   Lactic Acid, Venous 4.7 (*)    All other components within normal limits  I-STAT CHEM 8, ED - Abnormal; Notable for the following components:   BUN 25 (*)    Creatinine, Ser 1.90 (*)    Glucose, Bld 235 (*)    Calcium, Ion 1.10 (*)    All other components within normal limits  SARS CORONAVIRUS 2 (HOSPITAL ORDER, [facility])  CULTURE, BLOOD (ROUTINE X 2)  CULTURE, BLOOD (ROUTINE X 2)  ETHANOL  COMPREHENSIVE METABOLIC PANEL  BRAIN NATRIURETIC PEPTIDE  LACTIC ACID, PLASMA  CBC  CREATININE, SERUM  PROCALCITONIN  CBG MONITORING, ED  TROPONIN I (HIGH SENSITIVITY)  TROPONIN I (HIGH SENSITIVITY)    EKG EKG Interpretation  Date/Time:  Friday March 31 2019 09:05:26 EDT Ventricular Rate:  109 PR Interval:    QRS Duration: 100 QT Interval:  331 QTC Calculation: 446 R Axis:   36 Text Interpretation:  Sinus tachycardia no acute ST/T changes rate is faster compared to July 2020 Confirmed by Sherwood Gambler [phone]) on 03/31/2019 9:27:49 AM   Radiology Dg Chest Portable 1 View  Result Date: 03/31/2019 CLINICAL DATA:  Respiratory distress EXAM: PORTABLE CHEST 1 VIEW COMPARISON:  February 11, 2019 FINDINGS: There is cardiomegaly with pulmonary venous hypertension. There is  interstitial edema throughout the lungs with patchy airspace opacity in the mid to lower lung regions, likely alveolar edema. There is a small right pleural effusion. No adenopathy evident. Loop recorder noted on the left.  No bone lesions.  No pneumothorax. IMPRESSION: Pulmonary vascular congestion with interstitial and patchy alveolar edema. Small right pleural effusion. These findings are indicative of congestive heart failure. A degree of superimposed pneumonia in the bases cannot be entirely excluded by radiography. Electronically Signed   By: Lowella Grip III M.D.   On: 03/31/2019 09:37    Procedures Procedure Name: Intubation Date/Time: 03/31/2019 11:00 AM Performed by: Sherwood Gambler, MD Pre-anesthesia Checklist: Patient identified, Patient being monitored, Emergency Drugs available, Timeout performed and Suction available Oxygen Delivery Method: Non-rebreather mask Preoxygenation: Pre-oxygenation with 100% oxygen Induction Type: Rapid sequence Ventilation: Mask ventilation without difficulty Laryngoscope Size: Glidescope and 3 Grade View: Grade I Tube size: 8.0 mm Number of attempts: 1 Airway Equipment and Method: Video-laryngoscopy Placement Confirmation: ETT inserted through vocal cords under direct vision,  CO2 detector and Breath sounds checked- equal and bilateral Secured at: 22 cm Tube secured with: ETT holder Dental Injury: Teeth and Oropharynx as per pre-operative assessment     .Critical Care Performed by: Sherwood Gambler, MD Authorized by: Sherwood Gambler, MD   Critical care provider statement:    Critical care time (minutes):  60   Critical care time was exclusive of:  Separately billable procedures and treating other patients   Critical care was necessary to treat or prevent imminent or life-threatening deterioration of the following conditions:  Respiratory failure   Critical care was time spent personally by me on the following activities:  Discussions with  consultants, evaluation of patient's response to treatment, examination of patient, ordering and performing treatments and interventions, ordering and review of laboratory studies, ordering and review of radiographic studies, pulse oximetry, re-evaluation of patient's condition, obtaining history from patient or surrogate, review of old charts and ventilator management   (including critical care time)  Medications Ordered in ED Medications  propofol (DIPRIVAN) 1000 MG/100ML infusion (15 mcg/kg/min  101.6 kg Intravenous Rate/Dose Change 03/31/19 1045)  enoxaparin (LOVENOX) injection 40 mg (has no administration in time range)  famotidine (PEPCID) IVPB 20 mg premix (has no administration in time range)  fentaNYL 2515mcg in NS 234mL (4mcg/ml) infusion-PREMIX (has no administration in time range)  methylPREDNISolone sodium succinate (SOLU-MEDROL) 125 mg/2 mL injection 125 mg (125 mg Intravenous Given 03/31/19 0929)  albuterol (PROVENTIL,VENTOLIN) solution continuous neb (10 mg/hr Nebulization Given 03/31/19 0958)  furosemide (LASIX) injection 40 mg (40 mg Intravenous Given 03/31/19 0957)  etomidate (AMIDATE) injection 30 mg (30 mg Intravenous Given 03/31/19 1033)  succinylcholine (ANECTINE) injection 120 mg (120 mg Intravenous Given 03/31/19 1028)     Initial Impression / Assessment and Plan / ED Course  I have reviewed the triage vital signs and the nursing notes.  Pertinent labs & imaging results that were available during my care of the patient were reviewed by me and considered in my medical decision making (see chart for details).        Patient was placed on BiPAP shortly after arrival.  Had very similar presentation a few months ago.  He seemed to be initially getting a little bit better on BiPAP and his mental status slightly improved but his work of breathing  remained very labored and seem to be tiring out after being on the BiPAP for an hour.  Thus decision made to intubate.  His vital  signs have significantly improved with intubation.  He was given multiple medicines above for COPD and what appears to also be CHF.  Discussed with Dr. Tamala Julian of ICU who will admit.  Leiby Al Lullo was evaluated in Emergency Department on 03/31/2019 for the symptoms described in the history of present illness. He was evaluated in the context of the global COVID-19 pandemic, which necessitated consideration that the patient might be at risk for infection with the SARS-CoV-2 virus that causes COVID-19. Institutional protocols and algorithms that pertain to the evaluation of patients at risk for COVID-19 are in a state of rapid change based on information released by regulatory bodies including the CDC and federal and state organizations. These policies and algorithms were followed during the patient's care in the ED.   Final Clinical Impressions(s) / ED Diagnoses   Final diagnoses:  Acute respiratory failure with hypoxia and hypercapnia [facility])    ED Discharge Orders    None       Sherwood Gambler, MD 03/31/19 [place], MD 03/31/19 1116

## 2019-03-31 NOTE — ED Notes (Signed)
Glucose in chem8 - 235

## 2019-03-31 NOTE — ED Triage Notes (Signed)
Pt arrived via [facility]. Pt was getting out of vehicle and fell and was in respiratory distress. A bystander called EMS. EMS found pt was cyanotic, had labored breathing. EMS gave albuterol neb, magnesium 1g, IM epi 0.3mg x2. Pt arrived on cpap from EMS w/labored breathing using accessory muscles. Pt is tachy on monitor

## 2019-03-31 NOTE — H&P (Signed)
NAME:  [name], MRN:  [id number], DOB:  Aug 23, 1945, LOS: 0 ADMISSION DATE:  03/31/2019, CONSULTATION DATE:  03/31/19 REFERRING MD:  ER, CHIEF COMPLAINT:  SOB   Brief History   73 year old morbidly obese man presenting with acute hypoxemic respiratory failure likely secondary to CHF exacerbation.  History of present illness   History is per chart review as patient is intubated and sedated.  This is a 73 year old man with a history of cardiomyopathy, morbid obesity, substance abuse on suboxone, COPD, primary polycythemia on hydroxyurea, chronic kidney disease, prior polysubstance abuse who is feeling ill over the past day.  Apparently he fell out of the car and EMS arrived to find him cyanotic.  He was brought to the emergency department and tried on BiPAP with nitroglycerin drip.  Unfortunately he continued to deteriorate and required intubation.  PCCM is been asked to admit.  Looks like I admitted him for a similar issue back in July.  He improved with supportive care on the vent as well as diuresis, CAP coverage and was able to be discharged 4 days later.  Past Medical History   Past Medical History:  Diagnosis Date  . Acute lower GI bleeding 02/15/2017  . Atrial fibrillation ([place])   . Cardiomyopathy    EF55% 11/14<<35%   . CHF (congestive heart failure) ([place])   . COPD (chronic obstructive pulmonary disease) ([place])   . Essential hypertension   . GERD (gastroesophageal reflux disease)   . Gout   . Hepatitis    Possible history  . History of atrial flutter    Ablation 2012  . 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 Obesity   . Pneumonia X 1  . Primary polycythemia ([place]) 06/12/2013  . Renal insufficiency   . Substance abuse ([place])    History of alcohol; hx cocaine, heroin, crack use  . Syncope     Significant Hospital Events   03/31/19 admitted  Consults:  None  Procedures:  9/11 intubated in ER  Significant Diagnostic Tests:  CXR: preintubation-  edema postinbuation- R infiltrates  Micro Data:  Blood 9/11>> Sputum 9/11>>  Antimicrobials:  Zosyn 9/11>>   Interim history/subjective:  Admitted, heavily sedated.  Objective   Blood pressure 102/69, pulse 78, resp. rate 18, height 5\' 8"  (1.727 m), weight 101.6 kg, SpO2 95 %.    Vent Mode: PRVC FiO2 (%):  [80 %-100 %] 80 % Set Rate:  [15 bmp-20 bmp] 20 bmp Vt Set:  [540 mL] 540 mL PEEP:  [8 cmH20-14 cmH20] 14 cmH20 Plateau Pressure:  [28 cmH20] 28 cmH20  No intake or output data in the 24 hours ending 03/31/19 1125 Filed Weights   03/31/19 0944  Weight: 101.6 kg    Examination: GEN: Obese chronically ill man on ventilator HEENT: Endotracheal tube in place with minimal secretions CV: Heart sounds are distant secondary to body habitus, extremities are warm PULM: He has diffuse wheezing, passive on ventilator, no significant effusion with ultrasound GI: Protuberant, notable abdominal venous distention, no ascites with ultrasound EXT: 1+ pitting edema on top of chronic venous stasis changes NEURO: Withdraws from pain, remains heavily sedated from induction PSYCH: Deferred SKIN: No rashes   Resolved Hospital Problem list   NA  Assessment & Plan:  # Acute hypoxemic respiratory failure-likely multifactorial, will tx for combination of pulmonary edema, AECOPD and aspiration pneumonitis.  Recurrent issue for this patient unfortunately. # Underlying morbid obesity, cardiomyopathy, COPD, CKD # History of narcotic abuse currently on Suboxone # Polycythemia on hydroxyurea #  Afib on warfarin, subtherapeutic, with slumping over should probably check baseline head CT  - Steroids, zosyn, lasix.  Some combination of these should improve status. - Check Pct, f/u BNP, CT head - Strict I/O, foley, trend BMET, Mg replete PRN - Vent with usual VAP and ARDS protocols in place - Consider SLP eval prior to DC  Best practice:  Diet: NPO, TF consult Pain/Anxiety/Delirium protocol (if  indicated): Fentanyl and propofol gtt, will have to tinker with this given suboxone use VAP protocol (if indicated): Ordered 9/11 DVT prophylaxis: Warfarin, pharmacy to help get him back to  GI prophylaxis: PPI Glucose control: SSI Mobility: BR Code Status: Full Family Communication: will reach out Disposition: ICU  Labs   CBC: Recent Labs  Lab 03/31/19 0915 03/31/19 0923 03/31/19 1114  WBC 11.1*  --   --   NEUTROABS 8.4*  --   --   HGB 15.0 16.0 16.0  HCT 44.6 47.0 47.0  MCV 126.7*  --   --   PLT 201  --   --     Basic Metabolic Panel: Recent Labs  Lab 03/31/19 0915 03/31/19 0923 03/31/19 1114  NA 142 143 144  K 4.5 4.4 4.5  CL 108 106  --   CO2 21*  --   --   GLUCOSE 247* 235*  --   BUN 22 25*  --   CREATININE 1.84* 1.90*  --   CALCIUM 8.5*  --   --    GFR: Estimated Creatinine Clearance: 41.2 mL/min (A) (by C-G formula based on SCr of 1.9 mg/dL (H)). Recent Labs  Lab 03/31/19 0915  WBC 11.1*  LATICACIDVEN 4.7*    Liver Function Tests: Recent Labs  Lab 03/31/19 0915  AST 42*  ALT 32  ALKPHOS 117  BILITOT 1.3*  PROT 7.3  ALBUMIN 3.4*   No results for input(s): LIPASE, AMYLASE in the last 168 hours. No results for input(s): AMMONIA in the last 168 hours.  ABG    Component Value Date/Time   PHART 7.280 (L) 03/31/2019 1114   PCO2ART 56.9 (H) 03/31/2019 1114   PO2ART 99.0 03/31/2019 1114   HCO3 26.7 03/31/2019 1114   TCO2 28 03/31/2019 1114   ACIDBASEDEF 1.0 03/31/2019 1114   O2SAT 97.0 03/31/2019 1114     Coagulation Profile: Recent Labs  Lab 03/28/19 1011 03/31/19 0915  INR 2.2 1.7*    Cardiac Enzymes: No results for input(s): CKTOTAL, CKMB, CKMBINDEX, TROPONINI in the last 168 hours.  HbA1C: Hgb A1c MFr Bld  Date/Time Value Ref Range Status  02/10/2019 12:32 AM 5.8 (H) 4.8 - 5.6 % Final    Comment:    (NOTE) Pre diabetes:          5.7%-6.4% Diabetes:              >6.4% Glycemic control for   <7.0% adults with diabetes    04/13/2018 04:56 AM 5.4 4.8 - 5.6 % Final    Comment:    (NOTE) Pre diabetes:          5.7%-6.4% Diabetes:              >6.4% Glycemic control for   <7.0% adults with diabetes     CBG: No results for input(s): GLUCAP in the last 168 hours.  Review of Systems:   Cannot obtain, patient intubated/sedated  Past Medical History  He,  has a past medical history of Acute lower GI bleeding (02/15/2017), Atrial fibrillation ([place]), Cardiomyopathy, CHF (congestive heart failure) ([place]),  COPD (chronic obstructive pulmonary disease) ([place]), Essential hypertension, GERD (gastroesophageal reflux disease), Gout, Hepatitis, History of atrial flutter, Myeloproliferative neoplasm ([place]) (08/15/2013), Obesity, Pneumonia (X 1), Primary polycythemia ([place]) (06/12/2013), Renal insufficiency, Substance abuse ([place]), and Syncope.   Surgical History    Past Surgical History:  Procedure Laterality Date  . CARDIAC ELECTROPHYSIOLOGY MAPPING AND ABLATION  08/2010   [name] 09/07/2010 (12/14/2012)  . COLONOSCOPY     15-20 years ago had colon in [place]  . EXCISIONAL HEMORRHOIDECTOMY  1970's  . LOOP RECORDER IMPLANT N/A 08/23/2013   Procedure: LOOP RECORDER IMPLANT;  Surgeon: Deboraha Sprang, MD;  Location: [facility] CATH LAB;  Service: Cardiovascular;  Laterality: N/A;  . MULTIPLE EXTRACTIONS WITH ALVEOLOPLASTY Bilateral 01/24/2016   Procedure: MULTIPLE EXTRACTION WITH ALVEOLOPLASTY BILATERAL;  Surgeon: Diona Browner, DDS;  Location: [facility];  Service: Oral Surgery;  Laterality: Bilateral;  . MULTIPLE TOOTH EXTRACTIONS  01/24/2016   MULTIPLE EXTRACTION WITH ALVEOLOPLASTY BILATERAL (Bilateral)     Social History   reports that he has been smoking cigarettes. He has a 5.00 pack-year smoking history. He has never used smokeless tobacco. He reports current alcohol use of about 89.0 standard drinks of alcohol per week. He reports previous drug use. Drugs: "Crack" cocaine, Cocaine, and Heroin.   Family History   His family history includes  Alcohol abuse in his father; Cirrhosis in his father; Diabetes in his mother; Hypertension in his mother. There is no history of Colon cancer, Rectal cancer, or Stomach cancer.   Allergies Allergies  Allergen Reactions  . Codeine Shortness Of Breath and Rash     Home Medications  Prior to Admission medications   Medication Sig Start Date End Date Taking? Authorizing Provider  albuterol (PROAIR HFA) 108 (90 Base) MCG/ACT inhaler Inhale 1-2 puffs into the lungs every 6 (six) hours as needed for wheezing or shortness of breath. 01/11/19   [name], MD  albuterol (PROVENTIL) (2.5 MG/3ML) 0.083% nebulizer solution Take 3 mLs (2.5 mg total) by nebulization 2 (two) times daily as needed for wheezing or shortness of breath. 02/13/19   Kayleen Memos, DO  amiodarone (PACERONE) 200 MG tablet Take 0.5 tablets (100 mg total) by mouth daily. 06/20/18   Patsey Berthold, NP  camphor-menthol [facility]) lotion Apply topically 3 (three) times daily. 02/13/19   Kayleen Memos, DO  carvedilol (COREG) 3.125 MG tablet Take 1 tablet (3.125 mg total) by mouth daily. 03/29/19   Marianna Payment, MD  Fluticasone-Umeclidin-Vilant (TRELEGY ELLIPTA) 100-62.5-25 MCG/INH AEPB Inhale 1 puff into the lungs daily. 01/11/19   [name], MD  folic acid (FOLVITE) 1 MG tablet Take 1 tablet (1 mg total) by mouth daily. 09/02/18   [name], MD  furosemide (LASIX) 20 MG tablet Take 1 tablet (20 mg total) by mouth 2 (two) times daily. 03/30/19   Seiler, Amber K, NP  hydroxyurea (HYDREA) 500 MG capsule Take 2 capsules (1,000 mg total) by mouth daily. MAY TAKE WITH FOOD TO MINIMIZE GI SIDE EFFECTS Patient taking differently: Take 1,000 mg by mouth daily.  05/20/18   [name], MD  SUBOXONE 8-2 MG FILM Place 1 Film under the tongue daily.  08/21/15   [provider]  warfarin (COUMADIN) 2.5 MG tablet TAKE 1/2 TABLET BY MOUTH ON TUESDAY AND FRIDAY. TAKE 1 TABLET DAILY ON THE OTHER DAYS Patient taking differently: Take  1.25-2.5 mg by mouth See admin instructions. Per 12/27/18 visit anticoagulant dose Take 1.25 mg on Tuesday and Thursday 2.5 mg all  the other days 01/30/19   Aldine Contes, MD     Critical care time: 34 minutes

## 2019-04-01 ENCOUNTER — Inpatient Hospital Stay (HOSPITAL_COMMUNITY): Payer: Medicare Other

## 2019-04-01 LAB — PROTIME-INR
INR: 2 — ABNORMAL HIGH (ref 0.8–1.2)
Prothrombin Time: 22.2 seconds — ABNORMAL HIGH (ref 11.4–15.2)

## 2019-04-01 LAB — BASIC METABOLIC PANEL
Anion gap: 10 (ref 5–15)
BUN: 38 mg/dL — ABNORMAL HIGH (ref 8–23)
CO2: 24 mmol/L (ref 22–32)
Calcium: 7.9 mg/dL — ABNORMAL LOW (ref 8.9–10.3)
Chloride: 110 mmol/L (ref 98–111)
Creatinine, Ser: 2.09 mg/dL — ABNORMAL HIGH (ref 0.61–1.24)
GFR calc Af Amer: 36 mL/min — ABNORMAL LOW (ref >60)
GFR calc non Af Amer: 31 mL/min — ABNORMAL LOW (ref >60)
Glucose, Bld: 169 mg/dL — ABNORMAL HIGH (ref 70–99)
Potassium: 3.2 mmol/L — ABNORMAL LOW (ref 3.5–5.1)
Sodium: 144 mmol/L (ref 135–145)

## 2019-04-01 LAB — MAGNESIUM: Magnesium: 2.1 mg/dL (ref 1.7–2.4)

## 2019-04-01 LAB — POCT I-STAT 7, (LYTES, BLD GAS, ICA,H+H)
Acid-Base Excess: 4 mmol/L — ABNORMAL HIGH (ref 0.0–2.0)
Bicarbonate: 28.7 mmol/L — ABNORMAL HIGH (ref 20.0–28.0)
Calcium, Ion: 1.16 mmol/L (ref 1.15–1.40)
HCT: 41 % (ref 39.0–52.0)
Hemoglobin: 13.9 g/dL (ref 13.0–17.0)
O2 Saturation: 97 %
Potassium: 3.5 mmol/L (ref 3.5–5.1)
Sodium: 144 mmol/L (ref 135–145)
TCO2: 30 mmol/L (ref 22–32)
pCO2 arterial: 42.9 mmHg (ref 32.0–48.0)
pH, Arterial: 7.433 (ref 7.350–7.450)
pO2, Arterial: 93 mmHg (ref 83.0–108.0)

## 2019-04-01 LAB — CBC
HCT: 38.9 % — ABNORMAL LOW (ref 39.0–52.0)
Hemoglobin: 13.7 g/dL (ref 13.0–17.0)
MCH: 42.5 pg — ABNORMAL HIGH (ref 26.0–34.0)
MCHC: 35.2 g/dL (ref 30.0–36.0)
MCV: 120.8 fL — ABNORMAL HIGH (ref 80.0–100.0)
Platelets: 153 10*3/uL (ref 150–400)
RBC: 3.22 MIL/uL — ABNORMAL LOW (ref 4.22–5.81)
RDW: 14.4 % (ref 11.5–15.5)
WBC: 12.3 10*3/uL — ABNORMAL HIGH (ref 4.0–10.5)
nRBC: 0 % (ref 0.0–0.2)

## 2019-04-01 LAB — GLUCOSE, CAPILLARY
Glucose-Capillary: 177 mg/dL — ABNORMAL HIGH (ref 70–99)
Glucose-Capillary: 186 mg/dL — ABNORMAL HIGH (ref 70–99)
Glucose-Capillary: 127 mg/dL — ABNORMAL HIGH (ref 70–99)
Glucose-Capillary: 202 mg/dL — ABNORMAL HIGH (ref 70–99)
Glucose-Capillary: 106 mg/dL — ABNORMAL HIGH (ref 70–99)

## 2019-04-01 MED ORDER — WARFARIN SODIUM 2.5 MG PO TABS
2.5000 mg | ORAL_TABLET | Freq: Once | ORAL | Status: AC
  Administered 2019-04-01: 2.5 mg via ORAL
  Filled 2019-04-01: qty 1

## 2019-04-01 MED ORDER — INSULIN ASPART 100 UNIT/ML ~~LOC~~ SOLN: 0.0000 [IU] | Freq: Every day | SUBCUTANEOUS | Status: DC

## 2019-04-01 MED ORDER — PANTOPRAZOLE SODIUM 40 MG PO TBEC
40.0000 mg | DELAYED_RELEASE_TABLET | Freq: Every day | ORAL | Status: DC
  Administered 2019-04-02: 12:00:00 40 mg via ORAL
  Filled 2019-04-01: qty 1

## 2019-04-01 MED ORDER — MIDAZOLAM HCL 2 MG/2ML IJ SOLN
2.0000 mg | INTRAMUSCULAR | Status: DC | PRN
  Administered 2019-04-01 (×3): 2 mg via INTRAVENOUS
  Filled 2019-04-01 (×2): qty 2

## 2019-04-01 MED ORDER — INSULIN ASPART 100 UNIT/ML ~~LOC~~ SOLN
0.0000 [IU] | Freq: Three times a day (TID) | SUBCUTANEOUS | Status: DC
  Administered 2019-04-01: 7 [IU] via SUBCUTANEOUS
  Administered 2019-04-02 – 2019-04-03 (×2): 3 [IU] via SUBCUTANEOUS
  Administered 2019-04-03 – 2019-04-04 (×2): 4 [IU] via SUBCUTANEOUS

## 2019-04-01 MED ORDER — SODIUM CHLORIDE 0.9 % IV SOLN: INTRAVENOUS | Status: DC | PRN

## 2019-04-01 MED ORDER — FUROSEMIDE 10 MG/ML IJ SOLN
40.0000 mg | Freq: Every day | INTRAMUSCULAR | Status: DC
  Administered 2019-04-02: 40 mg via INTRAVENOUS
  Filled 2019-04-01: qty 4

## 2019-04-01 MED ORDER — ORAL CARE MOUTH RINSE
15.0000 mL | Freq: Two times a day (BID) | OROMUCOSAL | Status: DC
  Administered 2019-04-01: 15 mL via OROMUCOSAL

## 2019-04-01 MED ORDER — POTASSIUM CHLORIDE 20 MEQ/15ML (10%) PO SOLN
40.0000 meq | Freq: Once | ORAL | Status: AC
  Administered 2019-04-01: 05:00:00 40 meq
  Filled 2019-04-01: qty 30

## 2019-04-01 MED ORDER — MIDAZOLAM HCL 2 MG/2ML IJ SOLN
INTRAMUSCULAR | Status: AC
  Filled 2019-04-01: qty 2

## 2019-04-01 NOTE — Progress Notes (Addendum)
[facility] Progress Note Patient Name: [name] DOB: 01/26/1946 MRN: [id number]   Date of Service  04/01/2019  HPI/Events of Note  Notified by RN of need or more sedatives.  72/M intubated earlier and is on fentanyl 298mcg but patient is wide awake.  He is starting to gag and feels uncomfortable.  He is however still on 70% FiO2 on the vent.   eICU Interventions  Versed ordered prn.  Titrate FiO2 to keep sats >92%.     Intervention Category Intermediate Interventions: Other:  [name] 04/01/2019, 12:25 AM   4:14 AM Notified of K 3.2, Crea rising at 2.09.  Plan> Give KCl via OGT.

## 2019-04-01 NOTE — Progress Notes (Signed)
I called patient's significant other and updated her. She verbalizes understanding. All her questions and concerns were addressed.

## 2019-04-01 NOTE — Progress Notes (Signed)
ANTICOAGULATION CONSULT NOTE  Pharmacy Consult for warfarin Indication: atrial fibrillation   Assessment: CC/HPI: 2 yom presenting with acute hypoxemic respiratory failure likely secondary to CHF exacerbation.   PMH: afib on warfarin, CHF, COPD  Anticoag: warfarin pta for afib. INR 2  PTA warfarin dose per last outpatient anticoag clinic note: 2.5mg  daily except 1.25mg  on Mon/Thurs (last dose unknown PTA)  Renal: SCr 2.09, K 3.5  Heme/Onc: H&H 13.9/41, Plt 153  Goal of Therapy:  INR 2-3 Monitor platelets by anticoagulation protocol: Yes   Plan:  Warfarin 2.5 mg x 1 DC enox Daily INR  Levester Fresh, PharmD, BCPS, BCCCP Clinical Pharmacist [phone]  Please check AMION for all [place] numbers  04/01/2019 8:38 AM

## 2019-04-01 NOTE — Progress Notes (Signed)
NAME:  [name], MRN:  [id number], DOB:  1946/03/04, LOS: 1 ADMISSION DATE:  03/31/2019, CONSULTATION DATE:  03/31/19 REFERRING MD:  ER, CHIEF COMPLAINT:  SOB   Brief History   73 year old morbidly obese man presenting with acute hypoxemic respiratory failure likely secondary to CHF exacerbation. He required intubation at ED after no improvement with BiPAP and PCCM consulted for admission.  Past Medical History  CHF, COPD, Hx of substance abuse on Suboxone, CKD, Afib on Warfarin  Significant Hospital Events   9/11 Admitted  9/11 ET intubation >>9/12  Consults:    Procedures:  9/11 ET intubation in ER 9/12 Extubated  Significant Diagnostic Tests:  CXR 9/12: Mild persistent opacity at right mid lung is likely atelectasis  CXR 9/11: Vascular congestion and interstitial edema CT head 9/11: No acute abnormality Micro Data:  Blood culture 9/11: no growth <24 h MRSA PCR  9/11 negative SARS COVID-19 9/11 negative  Antimicrobials:  Zosyn 9/11>> 9/12  Interim history/subjective:  Patient was intubated when I saw him this AM. He was awake and followed the commands. He is insisting to be extubated.  Objective   Blood pressure (!) 104/58, pulse 75, temperature (!) 97.4 F (36.3 C), temperature source Axillary, resp. rate 10, height 5\' 8"  (1.727 m), weight 101.7 kg, SpO2 100 %.    Vent Mode: PRVC FiO2 (%):  [40 %-80 %] 70 % Set Rate:  [20 bmp] 20 bmp Vt Set:  [540 mL] 540 mL PEEP:  [12 cmH20-14 cmH20] 12 cmH20 Plateau Pressure:  [20 cmH20-26 cmH20] 22 cmH20   Intake/Output Summary (Last 24 hours) at 04/01/2019 1148 Last data filed at 04/01/2019 1100 Gross per 24 hour  Intake 1148.78 ml  Output 2600 ml  Net -1451.22 ml   Filed Weights   03/31/19 0944 04/01/19 0500  Weight: 101.6 kg 101.7 kg    Examination: General: Morbidly obese, No acute distress HENT: Left IJ cath in place and intact Lungs: On mechanical ventilation, no rale Cardiovascular: Irregular  rhythm, no murmur Abdomen: Soft, non distended, nontender Extremities: Bilateral 1 + LEE edema Neuro: Awake and follows commands GU: Foley in place, clear yellow output  Resolved Hospital Problem list    Assessment & Plan:  Acute respiratory failure with hypoxia likely 2/2 CHF exacerbation:  Patient admitted yesterday (9/11) ABG on arrival with acute respiratory acidosis and CXR showed vascular congestion. BNP elevated at 1338 (was 419 one month ago)  Required intubation at ED.  Managed with Lasix, and started on empiric Zosyn on admission to cover any possible PNA/COPD exacerbation, aspiration pneumonitis and has been on mechanical ventilation.  Currently on ventilator with with PRVC. He has required Levophed temporarily overnight but currently stable off of it.  Physical exam today and CXR shows improvement.  No evidence of PNA on exam and CXR today. No evidence of COPD exacerbation. Patient is currently awake and follows commands.  ABG this AM with normal value (PH 7.43, CO2 42, HCO3 28.7)  -He has spontaneous breathing when off of sedation. Will try to wean--> Successfully extubated -Continue Lasix  -DC Zosyn as no evidence of PNA and no COPD exacerbation -BMP daily -I/O -Weight daily -Keep O2 sat>88% -Continue Duoneb PRN  AKI on CKD: Cr stable at 2 (1.8 on arrival. Baseline Cr around 1.4) Will monitor kidney function while diuresing  -BMP daily -I/O  Afib: on Warfarin at home. INR was subtherapeutic on arrival (1.7) and he was placed on Lovenox in addition to Warfarin.  -INR theraputic today at  2 -DC Lovenox and continue Warfarin -Continue Amiodarone -Continue cardiac monitoring -INR daily  Best practice:  Diet: NPO Pain/Anxiety/Delirium protocol (if indicated): N/A VAP protocol (if indicated): N/A DVT prophylaxis: Warfarin GI prophylaxis: Protonix Glucose control: CBG monitoring Mobility: Bed rest Code Status: Full Family Communication: Will contact  family  Disposition: ICU  Labs   CBC: Recent Labs  Lab 03/31/19 0915 03/31/19 0923 03/31/19 1114 04/01/19 0312 04/01/19 0445  WBC 11.1*  --   --  12.3*  --   NEUTROABS 8.4*  --   --   --   --   HGB 15.0 16.0 16.0 13.7 13.9  HCT 44.6 47.0 47.0 38.9* 41.0  MCV 126.7*  --   --  120.8*  --   PLT 201  --   --  153  --     Basic Metabolic Panel: Recent Labs  Lab 03/31/19 0915 03/31/19 0923 03/31/19 1114 04/01/19 0312 04/01/19 0445  NA 142 143 144 144 144  K 4.5 4.4 4.5 3.2* 3.5  CL 108 106  --  110  --   CO2 21*  --   --  24  --   GLUCOSE 247* 235*  --  169*  --   BUN 22 25*  --  38*  --   CREATININE 1.84* 1.90*  --  2.09*  --   CALCIUM 8.5*  --   --  7.9*  --   MG  --   --   --  2.1  --    GFR: Estimated Creatinine Clearance: 37.5 mL/min (A) (by C-G formula based on SCr of 2.09 mg/dL (H)). Recent Labs  Lab 03/31/19 0915 03/31/19 1100 04/01/19 0312  PROCALCITON  --  0.70  --   WBC 11.1*  --  12.3*  LATICACIDVEN 4.7* 2.3*  --     Liver Function Tests: Recent Labs  Lab 03/31/19 0915  AST 42*  ALT 32  ALKPHOS 117  BILITOT 1.3*  PROT 7.3  ALBUMIN 3.4*   No results for input(s): LIPASE, AMYLASE in the last 168 hours. No results for input(s): AMMONIA in the last 168 hours.  ABG    Component Value Date/Time   PHART 7.433 04/01/2019 0445   PCO2ART 42.9 04/01/2019 0445   PO2ART 93.0 04/01/2019 0445   HCO3 28.7 (H) 04/01/2019 0445   TCO2 30 04/01/2019 0445   ACIDBASEDEF 1.0 03/31/2019 1114   O2SAT 97.0 04/01/2019 0445     Coagulation Profile: Recent Labs  Lab 03/28/19 1011 03/31/19 0915 04/01/19 0312  INR 2.2 1.7* 2.0*    Cardiac Enzymes: No results for input(s): CKTOTAL, CKMB, CKMBINDEX, TROPONINI in the last 168 hours.  HbA1C: Hgb A1c MFr Bld  Date/Time Value Ref Range Status  02/10/2019 12:32 AM 5.8 (H) 4.8 - 5.6 % Final    Comment:    (NOTE) Pre diabetes:          5.7%-6.4% Diabetes:              >6.4% Glycemic control for   <7.0%  adults with diabetes   04/13/2018 04:56 AM 5.4 4.8 - 5.6 % Final    Comment:    (NOTE) Pre diabetes:          5.7%-6.4% Diabetes:              >6.4% Glycemic control for   <7.0% adults with diabetes     CBG: Recent Labs  Lab 03/31/19 1600 03/31/19 1937 03/31/19 2317 04/01/19 0423 04/01/19 0724  GLUCAP 137*  141* 146* 177* 186*    Review of Systems:     Past Medical History  He,  has a past medical history of Acute lower GI bleeding (02/15/2017), Atrial fibrillation ([place]), Cardiomyopathy, CHF (congestive heart failure) ([place]), COPD (chronic obstructive pulmonary disease) ([place]), Essential hypertension, GERD (gastroesophageal reflux disease), Gout, Hepatitis, History of atrial flutter, Myeloproliferative neoplasm ([place]) (08/15/2013), Obesity, Pneumonia (X 1), Primary polycythemia ([place]) (06/12/2013), Renal insufficiency, Substance abuse ([place]), and Syncope.   Surgical History    Past Surgical History:  Procedure Laterality Date  . CARDIAC ELECTROPHYSIOLOGY MAPPING AND ABLATION  08/2010   [name] 09/07/2010 (12/14/2012)  . COLONOSCOPY     15-20 years ago had colon in [place]  . EXCISIONAL HEMORRHOIDECTOMY  1970's  . LOOP RECORDER IMPLANT N/A 08/23/2013   Procedure: LOOP RECORDER IMPLANT;  Surgeon: Deboraha Sprang, MD;  Location: [facility] CATH LAB;  Service: Cardiovascular;  Laterality: N/A;  . MULTIPLE EXTRACTIONS WITH ALVEOLOPLASTY Bilateral 01/24/2016   Procedure: MULTIPLE EXTRACTION WITH ALVEOLOPLASTY BILATERAL;  Surgeon: Diona Browner, DDS;  Location: [facility];  Service: Oral Surgery;  Laterality: Bilateral;  . MULTIPLE TOOTH EXTRACTIONS  01/24/2016   MULTIPLE EXTRACTION WITH ALVEOLOPLASTY BILATERAL (Bilateral)     Social History   reports that he has been smoking cigarettes. He has a 5.00 pack-year smoking history. He has never used smokeless tobacco. He reports current alcohol use of about 89.0 standard drinks of alcohol per week. He reports previous drug use. Drugs: "Crack" cocaine, Cocaine, and  Heroin.   Family History   His family history includes Alcohol abuse in his father; Cirrhosis in his father; Diabetes in his mother; Hypertension in his mother. There is no history of Colon cancer, Rectal cancer, or Stomach cancer.   Allergies Allergies  Allergen Reactions  . Codeine Shortness Of Breath and Rash     Home Medications  Prior to Admission medications   Medication Sig Start Date End Date Taking? Authorizing Provider  albuterol (PROAIR HFA) 108 (90 Base) MCG/ACT inhaler Inhale 1-2 puffs into the lungs every 6 (six) hours as needed for wheezing or shortness of breath. 01/11/19   [name], MD  albuterol (PROVENTIL) (2.5 MG/3ML) 0.083% nebulizer solution Take 3 mLs (2.5 mg total) by nebulization 2 (two) times daily as needed for wheezing or shortness of breath. 02/13/19   Kayleen Memos, DO  amiodarone (PACERONE) 200 MG tablet Take 0.5 tablets (100 mg total) by mouth daily. 06/20/18   Patsey Berthold, NP  camphor-menthol [facility]) lotion Apply topically 3 (three) times daily. 02/13/19   Kayleen Memos, DO  carvedilol (COREG) 3.125 MG tablet Take 1 tablet (3.125 mg total) by mouth daily. 03/29/19   Marianna Payment, MD  Fluticasone-Umeclidin-Vilant (TRELEGY ELLIPTA) 100-62.5-25 MCG/INH AEPB Inhale 1 puff into the lungs daily. 01/11/19   [name], MD  folic acid (FOLVITE) 1 MG tablet Take 1 tablet (1 mg total) by mouth daily. 09/02/18   [name], MD  furosemide (LASIX) 20 MG tablet Take 1 tablet (20 mg total) by mouth 2 (two) times daily. 03/30/19   Seiler, Amber K, NP  hydroxyurea (HYDREA) 500 MG capsule Take 2 capsules (1,000 mg total) by mouth daily. MAY TAKE WITH FOOD TO MINIMIZE GI SIDE EFFECTS Patient taking differently: Take 1,000 mg by mouth daily.  05/20/18   [name], MD  SUBOXONE 8-2 MG FILM Place 1 Film under the tongue daily.  08/21/15   [provider]  warfarin (COUMADIN) 2.5 MG tablet TAKE 1/2 TABLET BY  MOUTH ON TUESDAY AND FRIDAY. TAKE 1 TABLET DAILY  ON THE OTHER DAYS Patient taking differently: Take 1.25-2.5 mg by mouth See admin instructions. Take 1.25 mg on Tuesday and Thursday 2.5 mg all the other days 01/30/19   Aldine Contes, MD     Critical care time:      Linna Hoff IM PGY-2 Pager [phone]

## 2019-04-01 NOTE — Progress Notes (Signed)
Wasted Fentanyl bag 240 mL in stericycle container. Witnessed by Corinda Gubler, RN.

## 2019-04-01 NOTE — Procedures (Signed)
Extubation Procedure Note  Patient Details:   Name: [name] DOB: [birth year] MRN: [id number]   Airway Documentation:    Vent end date: 04/01/19 Vent end time: 0925   Evaluation  O2 sats: stable throughout Complications: No apparent complications Patient did tolerate procedure well. Bilateral Breath Sounds: Clear   Yes   Patient extubated to [place] [facility]NC per MD order. Patient very alert. Stated he feels fine moving good air. Currently on 15L. RT to monitor.  Saunders Glance 04/01/2019, 9:27 AM

## 2019-04-02 ENCOUNTER — Inpatient Hospital Stay (HOSPITAL_COMMUNITY): Payer: Medicare Other

## 2019-04-02 LAB — BASIC METABOLIC PANEL
Anion gap: 10 (ref 5–15)
BUN: 52 mg/dL — ABNORMAL HIGH (ref 8–23)
CO2: 28 mmol/L (ref 22–32)
Calcium: 8.2 mg/dL — ABNORMAL LOW (ref 8.9–10.3)
Chloride: 103 mmol/L (ref 98–111)
Creatinine, Ser: 2.17 mg/dL — ABNORMAL HIGH (ref 0.61–1.24)
GFR calc Af Amer: 34 mL/min — ABNORMAL LOW (ref >60)
GFR calc non Af Amer: 30 mL/min — ABNORMAL LOW (ref >60)
Glucose, Bld: 127 mg/dL — ABNORMAL HIGH (ref 70–99)
Potassium: 3.7 mmol/L (ref 3.5–5.1)
Sodium: 141 mmol/L (ref 135–145)

## 2019-04-02 LAB — GLUCOSE, CAPILLARY
Glucose-Capillary: 140 mg/dL — ABNORMAL HIGH (ref 70–99)
Glucose-Capillary: 118 mg/dL — ABNORMAL HIGH (ref 70–99)
Glucose-Capillary: 88 mg/dL (ref 70–99)
Glucose-Capillary: 130 mg/dL — ABNORMAL HIGH (ref 70–99)

## 2019-04-02 LAB — PROTIME-INR
INR: 2.4 — ABNORMAL HIGH (ref 0.8–1.2)
Prothrombin Time: 25.4 seconds — ABNORMAL HIGH (ref 11.4–15.2)

## 2019-04-02 LAB — BRAIN NATRIURETIC PEPTIDE: B Natriuretic Peptide: 339.7 pg/mL — ABNORMAL HIGH (ref 0.0–100.0)

## 2019-04-02 MED ORDER — IPRATROPIUM-ALBUTEROL 0.5-2.5 (3) MG/3ML IN SOLN
3.0000 mL | Freq: Four times a day (QID) | RESPIRATORY_TRACT | Status: DC | PRN
  Administered 2019-04-02: 3 mL via RESPIRATORY_TRACT

## 2019-04-02 MED ORDER — WARFARIN SODIUM 2.5 MG PO TABS
2.5000 mg | ORAL_TABLET | Freq: Once | ORAL | Status: AC
  Administered 2019-04-02: 17:00:00 2.5 mg via ORAL
  Filled 2019-04-02: qty 1

## 2019-04-02 MED ORDER — IPRATROPIUM-ALBUTEROL 0.5-2.5 (3) MG/3ML IN SOLN
3.0000 mL | Freq: Two times a day (BID) | RESPIRATORY_TRACT | Status: DC
  Administered 2019-04-02 – 2019-04-03 (×3): 3 mL via RESPIRATORY_TRACT
  Filled 2019-04-02 (×4): qty 3

## 2019-04-02 MED ORDER — ALBUTEROL SULFATE (2.5 MG/3ML) 0.083% IN NEBU: 2.5000 mg | INHALATION_SOLUTION | RESPIRATORY_TRACT | Status: DC | PRN

## 2019-04-02 NOTE — Progress Notes (Signed)
ANTICOAGULATION CONSULT NOTE  Pharmacy Consult for warfarin Indication: atrial fibrillation   Assessment: CC/HPI: 87 yom presenting with acute hypoxemic respiratory failure likely secondary to CHF exacerbation.   PMH: afib on warfarin, CHF, COPD  Anticoag: warfarin pta for afib. INR 2.4  PTA warfarin dose per last outpatient anticoag clinic note: 2.5mg  daily except 1.25mg  on Mon/Thurs (last dose unknown PTA)  CBC stable  Goal of Therapy:  INR 2-3 Monitor platelets by anticoagulation protocol: Yes   Plan:  Warfarin 2.5 mg x 1 Daily INR  Levester Fresh, PharmD, BCPS, BCCCP Clinical Pharmacist [phone]  Please check AMION for all [place] numbers  04/02/2019 8:23 AM

## 2019-04-02 NOTE — Progress Notes (Addendum)
I called patient's significant other, Ms. [name] and gave her update about [name]. She asks if we are going to keep [name] in hospital for another day. I informed her that we would like to continue treatment and do some chest X ray and some labs. She agrees with our plan and says that she would like to make sure [name] gets completely better before coming home. I answered her questions.

## 2019-04-02 NOTE — Progress Notes (Signed)
NAME:  [name], MRN:  [id number], DOB:  04-11-1946, LOS: 2 ADMISSION DATE:  03/31/2019, CONSULTATION DATE:  03/31/19 REFERRING MD:ER, CHIEF COMPLAINT:SOB  Brief History   73 year old morbidly obese man presenting with acute hypoxemic respiratory failure likely secondary to CHF exacerbation. He required intubation at ED after no improvement with BiPAP and PCCM consulted for admission.  Past Medical History  CHF, COPD, Hx of substance abuse on Suboxone, CKD, Afib on Warfarin  Significant Hospital Events   9/11 Admitted  9/11 ET intubation >>9/12 Consults:   Procedures:  9/11 ET intubation in ER 9/12 Extubated Significant Diagnostic Tests:  CXR 9/12: Mild persistent opacity at right mid lung is likely atelectasis  CXR 9/11: Vascular congestion and interstitial edema CT head 9/11: No acute abnormality Micro Data:  Blood culture 9/11: no growth 2 days MRSA PCR  9/11 negative SARS COVID-19 9/11 negative  Antimicrobials:  Zosyn 9/11>>9/12 Interim history/subjective:  Patient mentions that he has been very well. Denies any shortness of breath, chest pain. No acute complaint. Has tolerated PO diet well without nausea, vomiting.    Objective   Blood pressure 114/67, pulse 88, temperature (!) 97.5 F (36.4 C), temperature source Oral, resp. rate 12, height 5\' 8"  (1.727 m), weight 101.6 kg, SpO2 97 %.    Vent Mode: PRVC FiO2 (%):  [70 %] 70 % Set Rate:  [20 bmp] 20 bmp Vt Set:  [540 mL] 540 mL PEEP:  [12 cmH20] 12 cmH20 Plateau Pressure:  [22 cmH20] 22 cmH20   Intake/Output Summary (Last 24 hours) at 04/02/2019 0732 Last data filed at 04/01/2019 1700 Gross per 24 hour  Intake 529.1 ml  Output 1000 ml  Net -470.9 ml   Filed Weights   03/31/19 0944 04/01/19 0500 04/02/19 0500  Weight: 101.6 kg 101.7 kg 101.6 kg    Examination: Physical Exam  Constitutional: Morbidly obese gentleman, no acute distress.  Cardiovascular: irregular rhythm, no murmur  Respiratory: Effort normal and breath sounds normal. No respiratory distress.  scattered end expiratory wheezes and rhonchi GI: Soft. Bowel sounds are normal. No distension. There is no tenderness.  Musculoskeletal: Bilateral 1-2+ pitting edema Neurological: Is alert and oriented   Resolved Hospital Problem list    Assessment & Plan:  Acute hypoxic respiratory failure Most likely due to CHF exacerbation and pulmonary edema  Improved with diuresis and respiratory support with mechanical ventilation and extubated yesterday. He has no complaint today. Is saturating well with 2 li [place]. I/O recorded as net - 470 ml yesterday. Lung exam improved but with some new wheezing and rhonchi today. Still has some LE edema.  Will repeat CXR today for any new opacity. -BNP. -Repeat CXR -Continue IV Lasix 40 mg QD and keep negative fluid balance as able -Continue Duoneb (Patient has some wheezing today) -Keep SpO2 >92%  AKI from ATN. Cr slightly elevated from yesterday after diuresis (2.17 from 2.09 yesterday)  - f/u BMET  Hx of A fib. - Continue amiodarone, coumadin  Best practice:  Diet: HH carb modified Pain/Anxiety/Delirium protocol (if indicated): N/A VAP protocol (if indicated): N/A DVT prophylaxis: Warfarin GI prophylaxis: Protonix Glucose control: CBG monitoring Mobility: Ambulate with assistance in room Code Status: Full Family Communication: Will contact family  Disposition: ICU  Labs   CBC: Recent Labs  Lab 03/31/19 0915 03/31/19 0923 03/31/19 1114 04/01/19 0312 04/01/19 0445  WBC 11.1*  --   --  12.3*  --   NEUTROABS 8.4*  --   --   --   --  HGB 15.0 16.0 16.0 13.7 13.9  HCT 44.6 47.0 47.0 38.9* 41.0  MCV 126.7*  --   --  120.8*  --   PLT 201  --   --  153  --     Basic Metabolic Panel: Recent Labs  Lab 03/31/19 0915 03/31/19 0923 03/31/19 1114 04/01/19 0312 04/01/19 0445 04/02/19 0440  NA 142 143 144 144 144 141  K 4.5 4.4 4.5 3.2* 3.5 3.7  CL 108  106  --  110  --  103  CO2 21*  --   --  24  --  28  GLUCOSE 247* 235*  --  169*  --  127*  BUN 22 25*  --  38*  --  52*  CREATININE 1.84* 1.90*  --  2.09*  --  2.17*  CALCIUM 8.5*  --   --  7.9*  --  8.2*  MG  --   --   --  2.1  --   --    GFR: Estimated Creatinine Clearance: 36.1 mL/min (A) (by C-G formula based on SCr of 2.17 mg/dL (H)). Recent Labs  Lab 03/31/19 0915 03/31/19 1100 04/01/19 0312  PROCALCITON  --  0.70  --   WBC 11.1*  --  12.3*  LATICACIDVEN 4.7* 2.3*  --     Liver Function Tests: Recent Labs  Lab 03/31/19 0915  AST 42*  ALT 32  ALKPHOS 117  BILITOT 1.3*  PROT 7.3  ALBUMIN 3.4*   No results for input(s): LIPASE, AMYLASE in the last 168 hours. No results for input(s): AMMONIA in the last 168 hours.  ABG    Component Value Date/Time   PHART 7.433 04/01/2019 0445   PCO2ART 42.9 04/01/2019 0445   PO2ART 93.0 04/01/2019 0445   HCO3 28.7 (H) 04/01/2019 0445   TCO2 30 04/01/2019 0445   ACIDBASEDEF 1.0 03/31/2019 1114   O2SAT 97.0 04/01/2019 0445     Coagulation Profile: Recent Labs  Lab 03/28/19 1011 03/31/19 0915 04/01/19 0312 04/02/19 0440  INR 2.2 1.7* 2.0* 2.4*    Cardiac Enzymes: No results for input(s): CKTOTAL, CKMB, CKMBINDEX, TROPONINI in the last 168 hours.  HbA1C: Hgb A1c MFr Bld  Date/Time Value Ref Range Status  02/10/2019 12:32 AM 5.8 (H) 4.8 - 5.6 % Final    Comment:    (NOTE) Pre diabetes:          5.7%-6.4% Diabetes:              >6.4% Glycemic control for   <7.0% adults with diabetes   04/13/2018 04:56 AM 5.4 4.8 - 5.6 % Final    Comment:    (NOTE) Pre diabetes:          5.7%-6.4% Diabetes:              >6.4% Glycemic control for   <7.0% adults with diabetes     CBG: Recent Labs  Lab 04/01/19 0724 04/01/19 1137 04/01/19 1556 04/01/19 2120 04/02/19 0719  GLUCAP 186* 127* 202* 106* 140*    Review of Systems:     Past Medical History  He,  has a past medical history of Acute lower GI bleeding  (02/15/2017), Atrial fibrillation ([place]), Cardiomyopathy, CHF (congestive heart failure) ([place]), COPD (chronic obstructive pulmonary disease) ([place]), Essential hypertension, GERD (gastroesophageal reflux disease), Gout, Hepatitis, History of atrial flutter, 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   Surgical History    Past  Surgical History:  Procedure Laterality Date  . CARDIAC ELECTROPHYSIOLOGY MAPPING AND ABLATION  08/2010   [name] 09/07/2010 (12/14/2012)  . COLONOSCOPY     15-20 years ago had colon in [place]  . EXCISIONAL HEMORRHOIDECTOMY  1970's  . LOOP RECORDER IMPLANT N/A 08/23/2013   Procedure: LOOP RECORDER IMPLANT;  Surgeon: Deboraha Sprang, MD;  Location: [facility] C[facility] LAB;  Service: Cardiovascular;  Laterality: N/A;  . MULTIPLE EXTRACTIONS WITH ALVEOLOPLASTY Bilateral 01/24/2016   Procedure: MULTIPLE EXTRACTION WITH ALVEOLOPLASTY BILATERAL;  Surgeon: Diona Browner, DDS;  Location: [place];  Service: Oral Surgery;  Laterality: Bilateral;  . MULTIPLE TOOTH EXTRACTIONS  01/24/2016   MULTIPLE EXTRACTION WITH ALVEOLOPLASTY BILATERAL (Bilateral)     Social History   reports that he has been smoking cigarettes. He has a 5.00 pack-year smoking history. He has never used smokeless tobacco. He reports current alcohol use of about 89.0 standard drinks of alcohol per week. He reports previous drug use. Drugs: "Crack" cocaine, Cocaine, and Heroin.   Family History   His family history includes Alcohol abuse in his father; Cirrhosis in his father; Diabetes in his mother; Hypertension in his mother. There is no history of Colon cancer, Rectal cancer, or Stomach cancer.   Allergies Allergies  Allergen Reactions  . Codeine Shortness Of Breath and Rash     Home Medications  Prior to Admission medications   Medication Sig Start Date End Date Taking? Authorizing Provider  albuterol  (PROAIR HFA) 108 (90 Base) MCG/ACT inhaler Inhale 1-2 puffs into the lungs every 6 (six) hours as needed for wheezing or shortness of breath. 01/11/19   [name], MD  albuterol (PROVENTIL) (2.5 MG/3ML) 0.083% nebulizer solution Take 3 mLs (2.5 mg total) by nebulization 2 (two) times daily as needed for wheezing or shortness of breath. 02/13/19   Kayleen Memos, DO  amiodarone (PACERONE) 200 MG tablet Take 0.5 tablets (100 mg total) by mouth daily. 06/20/18   Patsey Berthold, NP  camphor-menthol [facility]) lotion Apply topically 3 (three) times daily. 02/13/19   Kayleen Memos, DO  carvedilol (COREG) 3.125 MG tablet Take 1 tablet (3.125 mg total) by mouth daily. 03/29/19   Marianna Payment, MD  Fluticasone-Umeclidin-Vilant (TRELEGY ELLIPTA) 100-62.5-25 MCG/INH AEPB Inhale 1 puff into the lungs daily. 01/11/19   [name], MD  folic acid (FOLVITE) 1 MG tablet Take 1 tablet (1 mg total) by mouth daily. 09/02/18   [name], MD  furosemide (LASIX) 20 MG tablet Take 1 tablet (20 mg total) by mouth 2 (two) times daily. 03/30/19   Seiler, Amber K, NP  hydroxyurea (HYDREA) 500 MG capsule Take 2 capsules (1,000 mg total) by mouth daily. MAY TAKE WITH FOOD TO MINIMIZE GI SIDE EFFECTS Patient taking differently: Take 1,000 mg by mouth daily.  05/20/18   [name], MD  SUBOXONE 8-2 MG FILM Place 1 Film under the tongue daily.  08/21/15   [provider]  warfarin (COUMADIN) 2.5 MG tablet TAKE 1/2 TABLET BY MOUTH ON TUESDAY AND FRIDAY. TAKE 1 TABLET DAILY ON THE OTHER DAYS Patient taking differently: Take 1.25-2.5 mg by mouth See admin instructions. Take 1.25 mg on Tuesday and Thursday 2.5 mg all the other days 01/30/19   Aldine Contes, MD     Critical care time:     Linna Hoff IM PGY-2 Pager [phone]

## 2019-04-02 NOTE — Progress Notes (Signed)
Pharmacist Heart Failure Core Measure Documentation  Assessment: [name] has an EF documented as 35-40 on echo by 7/20.  Rationale: Heart failure patients with left ventricular systolic dysfunction (LVSD) and an EF < 40% should be prescribed an angiotensin converting enzyme inhibitor (ACEI) or angiotensin receptor blocker (ARB) at discharge unless a contraindication is documented in the medical record.  This patient is not currently on an ACEI or ARB for HF.  This note is being placed in the record in order to provide documentation that a contraindication to the use of these agents is present for this encounter.  ACE Inhibitor or Angiotensin Receptor Blocker is contraindicated (specify all that apply)  []   ACEI allergy AND ARB allergy []   Angioedema []   Moderate or severe aortic stenosis []   Hyperkalemia []   Hypotension []   Renal artery stenosis [x]   Worsening renal function, preexisting renal disease or dysfunction  Levester Fresh, PharmD, BCPS, BCCCP Clinical Pharmacist [phone]  Please check AMION for all [place] numbers  04/02/2019 1:27 PM

## 2019-04-03 DIAGNOSIS — J810 Acute pulmonary edema: Secondary | ICD-10-CM

## 2019-04-03 DIAGNOSIS — J441 Chronic obstructive pulmonary disease with (acute) exacerbation: Secondary | ICD-10-CM

## 2019-04-03 DIAGNOSIS — G9340 Encephalopathy, unspecified: Secondary | ICD-10-CM

## 2019-04-03 LAB — CBC
HCT: 40.8 % (ref 39.0–52.0)
Hemoglobin: 14 g/dL (ref 13.0–17.0)
MCH: 41.9 pg — ABNORMAL HIGH (ref 26.0–34.0)
MCHC: 34.3 g/dL (ref 30.0–36.0)
MCV: 122.2 fL — ABNORMAL HIGH (ref 80.0–100.0)
Platelets: 152 10*3/uL (ref 150–400)
RBC: 3.34 MIL/uL — ABNORMAL LOW (ref 4.22–5.81)
RDW: 14.5 % (ref 11.5–15.5)
WBC: 8.6 10*3/uL (ref 4.0–10.5)
nRBC: 0 % (ref 0.0–0.2)

## 2019-04-03 LAB — BASIC METABOLIC PANEL
Anion gap: 12 (ref 5–15)
BUN: 40 mg/dL — ABNORMAL HIGH (ref 8–23)
CO2: 26 mmol/L (ref 22–32)
Calcium: 8.2 mg/dL — ABNORMAL LOW (ref 8.9–10.3)
Chloride: 102 mmol/L (ref 98–111)
Creatinine, Ser: 1.99 mg/dL — ABNORMAL HIGH (ref 0.61–1.24)
GFR calc Af Amer: 38 mL/min — ABNORMAL LOW (ref >60)
GFR calc non Af Amer: 33 mL/min — ABNORMAL LOW (ref >60)
Glucose, Bld: 99 mg/dL (ref 70–99)
Potassium: 3.5 mmol/L (ref 3.5–5.1)
Sodium: 140 mmol/L (ref 135–145)

## 2019-04-03 LAB — PROTIME-INR
INR: 2.3 — ABNORMAL HIGH (ref 0.8–1.2)
Prothrombin Time: 24.8 seconds — ABNORMAL HIGH (ref 11.4–15.2)

## 2019-04-03 LAB — GLUCOSE, CAPILLARY
Glucose-Capillary: 91 mg/dL (ref 70–99)
Glucose-Capillary: 142 mg/dL — ABNORMAL HIGH (ref 70–99)
Glucose-Capillary: 151 mg/dL — ABNORMAL HIGH (ref 70–99)
Glucose-Capillary: 168 mg/dL — ABNORMAL HIGH (ref 70–99)

## 2019-04-03 MED ORDER — FLUTICASONE FUROATE-VILANTEROL 100-25 MCG/INH IN AEPB
1.0000 | INHALATION_SPRAY | Freq: Every day | RESPIRATORY_TRACT | Status: DC
  Administered 2019-04-04: 1 via RESPIRATORY_TRACT
  Filled 2019-04-03: qty 28

## 2019-04-03 MED ORDER — FUROSEMIDE 10 MG/ML IJ SOLN
40.0000 mg | Freq: Every day | INTRAMUSCULAR | Status: DC
  Administered 2019-04-03 – 2019-04-04 (×2): 40 mg via INTRAVENOUS
  Filled 2019-04-03 (×2): qty 4

## 2019-04-03 MED ORDER — UMECLIDINIUM-VILANTEROL 62.5-25 MCG/INH IN AEPB
1.0000 | INHALATION_SPRAY | Freq: Every day | RESPIRATORY_TRACT | Status: DC
  Administered 2019-04-03: 11:00:00 1 via RESPIRATORY_TRACT
  Filled 2019-04-03: qty 14

## 2019-04-03 MED ORDER — CARVEDILOL 3.125 MG PO TABS
3.1250 mg | ORAL_TABLET | Freq: Every day | ORAL | Status: DC
  Administered 2019-04-03 – 2019-04-04 (×2): 3.125 mg via ORAL
  Filled 2019-04-03 (×3): qty 1

## 2019-04-03 MED ORDER — METHYLPREDNISOLONE SODIUM SUCC 125 MG IJ SOLR
40.0000 mg | Freq: Four times a day (QID) | INTRAMUSCULAR | Status: AC
  Administered 2019-04-03 (×3): 40 mg via INTRAVENOUS
  Filled 2019-04-03 (×3): qty 2

## 2019-04-03 MED ORDER — HYDROXYUREA 500 MG PO CAPS
1000.0000 mg | ORAL_CAPSULE | Freq: Every day | ORAL | Status: DC
  Administered 2019-04-03 – 2019-04-04 (×2): 1000 mg via ORAL
  Filled 2019-04-03 (×2): qty 2

## 2019-04-03 MED ORDER — WARFARIN SODIUM 2.5 MG PO TABS: 2.5000 mg | ORAL_TABLET | ORAL | Status: DC

## 2019-04-03 MED ORDER — UMECLIDINIUM BROMIDE 62.5 MCG/INH IN AEPB
1.0000 | INHALATION_SPRAY | Freq: Every day | RESPIRATORY_TRACT | Status: DC
  Administered 2019-04-03 – 2019-04-04 (×2): 1 via RESPIRATORY_TRACT
  Filled 2019-04-03: qty 7

## 2019-04-03 MED ORDER — POTASSIUM CHLORIDE CRYS ER 20 MEQ PO TBCR
30.0000 meq | EXTENDED_RELEASE_TABLET | Freq: Once | ORAL | Status: AC
  Administered 2019-04-03: 30 meq via ORAL
  Filled 2019-04-03: qty 2

## 2019-04-03 MED ORDER — WARFARIN 1.25 MG HALF TABLET
1.2500 mg | ORAL_TABLET | ORAL | Status: DC
  Administered 2019-04-03: 1.25 mg via ORAL
  Filled 2019-04-03: qty 1

## 2019-04-03 NOTE — Discharge Instructions (Addendum)

## 2019-04-03 NOTE — Progress Notes (Signed)
I called Mr. Clelia Schaumann significant other, and updated her over the phone. She verbalizes understanding about the plan.

## 2019-04-03 NOTE — Progress Notes (Signed)
Call from tele: pt heart rate in the 140's briefly. NT was taking pt vitals at the same time and had a pulse of 44. Equipment adjusted - pulse 91 afterwards. 

## 2019-04-03 NOTE — Progress Notes (Signed)
NAME:  [name], MRN:  [id number], DOB:  [birth year], LOS: 3 ADMISSION DATE:  03/31/2019, CONSULTATION DATE:  03/31/19 REFERRING MD:ER, CHIEF COMPLAINT:SOB  Brief History   73 year old morbidly obese man presenting with acute hypoxemic respiratory failure likely secondary to CHF exacerbation.He required intubation at ED after no improvement with BiPAP and PCCM consulted for admission.  Past Medical History  CHF, COPD, Hx of substance abuse on Suboxone, CKD, Afib on Warfarin  Significant Hospital Events   9/11 Admitted  9/11 ET intubation >>9/12  Consults:   Procedures:  9/11 ET intubation in ER 9/12 Extubated  Significant Diagnostic Tests:  CXR 9/13: Right side atelectasis with slight improvement, no infiltration CXR9/12:Mild persistent opacity at right mid lung is likely atelectasis  CXR 9/11: Vascular congestion and interstitial edema CT head 9/11: No acute abnormality  Micro Data:  Bloodculture9/11: no growth 2 days MRSA PCR 9/11 negative SARS COVID-19 9/11 negative  Antimicrobials:  Zosyn 9/11>>9/12  Interim history/subjective:  Patient is doing great. No shortness of breath, chest pain. No orthopnea. He ambulates well in the room without difficulty.  Objective   Blood pressure 134/69, pulse 82, temperature 98 F (36.7 C), temperature source Oral, resp. rate 15, height 5\' 8"  (1.727 m), weight 102.3 kg, SpO2 96 %.       Intake/Output Summary (Last 24 hours) at 04/03/2019 0731 Last data filed at 04/03/2019 0700 Gross per 24 hour  Intake 960 ml  Output 1700 ml  Net -740 ml   Filed Weights   04/01/19 0500 04/02/19 0500 04/03/19 0500  Weight: 101.7 kg 101.6 kg 102.3 kg   Examination: General: Morbidly obese gentleman, well developed, No scute distress Lungs: Normal work of breathing, eild end expiratory wheezing,  Cardiovascular: Irregular rhythm, no murmur Abdomen: Soft, non distended, non tender to palpation Extremities: Bilateral  1+ LEE Neuro: Alert and oriented x 3  Resolved Hospital Problem list    Assessment & Plan:  Acute hypoxic respiratory failure Most likely due to CHF exacerbation and pulmonary edema.  Improved with diuresis and support with mechanical ventilation. Extubated 9/13 Patient is saturating well on 2 li nasal O2. Diuresing. I/O yesterday net - 670 ml. (Urine not collected completely) BNP down to 339 from 1338 on arrival. Patient had some wheezing and rhonchi on exam yesterday, received Duoneb and incentive spirometry. Lung exam improved today but still with mild wheezing. CXR 9/13 with no new infiltration.  -Continue IV Lasix 40 mg QD and keep negative fluid balance as able -Stable to transfer to floor -Continue incentive spirometry  -PT OT eval and treat -Continue Duoneb  -Keep SpO2 >92%  HFrEF: With LV EF 35-45% per last TTE 02/10/2019 Patient with recurrent hospitalization due to exacerbation. -May resume beta blocker after acute exacerbation resolves. -Should f/u with cardiology or PCP to ensure being on optimal HF treatment  AKI from ATN. Cr improving. Today down to 1.99  -BMP daily  Hx of A fib. - Continue amiodarone, coumadin  Best practice:  Diet:HH carb modified Pain/Anxiety/Delirium protocol (if indicated):N/A VAP protocol (if indicated):N/A DVT prophylaxis:Warfarin GI prophylaxis:Protonix Glucose control:CBG monitoring Mobility:Ambulate with assistance in room Code Status:Full Family Communication:Will contact family Disposition:ICU. May transfer to floor today  Labs   CBC: Recent Labs  Lab 03/31/19 0915 03/31/19 0923 03/31/19 1114 04/01/19 0312 04/01/19 0445 04/03/19 0311  WBC 11.1*  --   --  12.3*  --  8.6  NEUTROABS 8.4*  --   --   --   --   --  HGB 15.0 16.0 16.0 13.7 13.9 14.0  HCT 44.6 47.0 47.0 38.9* 41.0 40.8  MCV 126.7*  --   --  120.8*  --  122.2*  PLT 201  --   --  153  --  0000000    Basic Metabolic Panel: Recent Labs  Lab  03/31/19 0915 03/31/19 0923 03/31/19 1114 04/01/19 0312 04/01/19 0445 04/02/19 0440 04/03/19 0311  NA 142 143 144 144 144 141 140  K 4.5 4.4 4.5 3.2* 3.5 3.7 3.5  CL 108 106  --  110  --  103 102  CO2 21*  --   --  24  --  28 26  GLUCOSE 247* 235*  --  169*  --  127* 99  BUN 22 25*  --  38*  --  52* 40*  CREATININE 1.84* 1.90*  --  2.09*  --  2.17* 1.99*  CALCIUM 8.5*  --   --  7.9*  --  8.2* 8.2*  MG  --   --   --  2.1  --   --   --    GFR: Estimated Creatinine Clearance: 39.5 mL/min (A) (by C-G formula based on SCr of 1.99 mg/dL (H)). Recent Labs  Lab 03/31/19 0915 03/31/19 1100 04/01/19 0312 04/03/19 0311  PROCALCITON  --  0.70  --   --   WBC 11.1*  --  12.3* 8.6  LATICACIDVEN 4.7* 2.3*  --   --     Liver Function Tests: Recent Labs  Lab 03/31/19 0915  AST 42*  ALT 32  ALKPHOS 117  BILITOT 1.3*  PROT 7.3  ALBUMIN 3.4*   No results for input(s): LIPASE, AMYLASE in the last 168 hours. No results for input(s): AMMONIA in the last 168 hours.  ABG    Component Value Date/Time   PHART 7.433 04/01/2019 0445   PCO2ART 42.9 04/01/2019 0445   PO2ART 93.0 04/01/2019 0445   HCO3 28.7 (H) 04/01/2019 0445   TCO2 30 04/01/2019 0445   ACIDBASEDEF 1.0 03/31/2019 1114   O2SAT 97.0 04/01/2019 0445     Coagulation Profile: Recent Labs  Lab 03/28/19 1011 03/31/19 0915 04/01/19 0312 04/02/19 0440 04/03/19 0311  INR 2.2 1.7* 2.0* 2.4* 2.3*    Cardiac Enzymes: No results for input(s): CKTOTAL, CKMB, CKMBINDEX, TROPONINI in the last 168 hours.  HbA1C: Hgb A1c MFr Bld  Date/Time Value Ref Range Status  02/10/2019 12:32 AM 5.8 (H) 4.8 - 5.6 % Final    Comment:    (NOTE) Pre diabetes:          5.7%-6.4% Diabetes:              >6.4% Glycemic control for   <7.0% adults with diabetes   04/13/2018 04:56 AM 5.4 4.8 - 5.6 % Final    Comment:    (NOTE) Pre diabetes:          5.7%-6.4% Diabetes:              >6.4% Glycemic control for   <7.0% adults with  diabetes     CBG: Recent Labs  Lab 04/01/19 2120 04/02/19 0719 04/02/19 1133 04/02/19 1725 04/02/19 2005  GLUCAP 106* 140* 118* 88 130*    Review of Systems:     Past Medical History  He,  has a past medical history of Acute lower GI bleeding (02/15/2017), Atrial fibrillation ([place]), Cardiomyopathy, CHF (congestive heart failure) ([place]), COPD (chronic obstructive pulmonary disease) ([place]), Essential hypertension, GERD (gastroesophageal reflux disease), Gout, Hepatitis, History of  atrial flutter, 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   Surgical History    Past Surgical History:  Procedure Laterality Date  . CARDIAC ELECTROPHYSIOLOGY MAPPING AND ABLATION  08/2010   [name] 09/07/2010 (12/14/2012)  . COLONOSCOPY     15-20 years ago had colon in [place]  . EXCISIONAL HEMORRHOIDECTOMY  1970's  . LOOP RECORDER IMPLANT N/A 08/23/2013   Procedure: LOOP RECORDER IMPLANT;  Surgeon: Deboraha Sprang, MD;  [facility] C[facility] LAB;  Service: Cardiovascular;  Laterality: N/A;  . MULTIPLE EXTRACTIONS WITH ALVEOLOPLASTY Bilateral 01/24/2016   Procedure: MULTIPLE EXTRACTION WITH ALVEOLOPLASTY BILATERAL;  Surgeon: Diona Browner, DDS;  Location: [place];  Service: Oral Surgery;  Laterality: Bilateral;  . MULTIPLE TOOTH EXTRACTIONS  01/24/2016   MULTIPLE EXTRACTION WITH ALVEOLOPLASTY BILATERAL (Bilateral)     Social History   reports that he has been smoking cigarettes. He has a 5.00 pack-year smoking history. He has never used smokeless tobacco. He reports current alcohol use of about 89.0 standard drinks of alcohol per week. He reports previous drug use. Drugs: "Crack" cocaine, Cocaine, and Heroin.   Family History   His family history includes Alcohol abuse in his father; Cirrhosis in his father; Diabetes in his mother; Hypertension in his mother. There is no history of Colon cancer,  Rectal cancer, or Stomach cancer.   Allergies Allergies  Allergen Reactions  . Codeine Shortness Of Breath and Rash     Home Medications  Prior to Admission medications   Medication Sig Start Date End Date Taking? Authorizing Provider  albuterol (PROAIR HFA) 108 (90 Base) MCG/ACT inhaler Inhale 1-2 puffs into the lungs every 6 (six) hours as needed for wheezing or shortness of breath. 01/11/19   [name], MD  albuterol (PROVENTIL) (2.5 MG/3ML) 0.083% nebulizer solution Take 3 mLs (2.5 mg total) by nebulization 2 (two) times daily as needed for wheezing or shortness of breath. 02/13/19   Kayleen Memos, DO  amiodarone (PACERONE) 200 MG tablet Take 0.5 tablets (100 mg total) by mouth daily. 06/20/18   Patsey Berthold, NP  camphor-menthol [facility]) lotion Apply topically 3 (three) times daily. 02/13/19   Kayleen Memos, DO  carvedilol (COREG) 3.125 MG tablet Take 1 tablet (3.125 mg total) by mouth daily. 03/29/19   Marianna Payment, MD  Fluticasone-Umeclidin-Vilant (TRELEGY ELLIPTA) 100-62.5-25 MCG/INH AEPB Inhale 1 puff into the lungs daily. 01/11/19   [name], MD  folic acid (FOLVITE) 1 MG tablet Take 1 tablet (1 mg total) by mouth daily. 09/02/18   [name], MD  furosemide (LASIX) 20 MG tablet Take 1 tablet (20 mg total) by mouth 2 (two) times daily. 03/30/19   Seiler, Amber K, NP  hydroxyurea (HYDREA) 500 MG capsule Take 2 capsules (1,000 mg total) by mouth daily. MAY TAKE WITH FOOD TO MINIMIZE GI SIDE EFFECTS Patient taking differently: Take 1,000 mg by mouth daily.  05/20/18   [name], MD  SUBOXONE 8-2 MG FILM Place 1 Film under the tongue daily.  08/21/15   [provider]  warfarin (COUMADIN) 2.5 MG tablet TAKE 1/2 TABLET BY MOUTH ON TUESDAY AND FRIDAY. TAKE 1 TABLET DAILY ON THE OTHER DAYS Patient taking differently: Take 1.25-2.5 mg by mouth See admin instructions. Take 1.25 mg on Tuesday and Thursday 2.5 mg all the other days 01/30/19   Aldine Contes, MD      Critical care time:  Elham "Bobbe Medico, MD IM PGY-2 Pager [phone]  Attending Note:  73 year old male with PMH of COPD and CHF with respiratory failure that is now extubated.  No events overnight.  On exam, continues to wheeze.  I reviewed CXR myself, pulmonary edema noted.  Discussed with resident.  Acute respiratory failure:  - Monitor for airway protection  Acute pulmonary edema:  - Diureses as ordered  Hypoxemia:  - Titrate O2 for sat of 88-92%  COPD:  - Start steroids  - D/C abx  Transfer to progressive and to [facility] with PCCM off 9/15  Patient seen and examined, agree with above note.  I dictated the care and orders written for this patient under my direction.  Rush Farmer, [place]

## 2019-04-03 NOTE — Progress Notes (Signed)
Pt just got here form 55M. Pt is A&OX4. Vital signs stable. tele monitor has been set up. Will continue to monitor.

## 2019-04-03 NOTE — Progress Notes (Signed)
ANTICOAGULATION CONSULT NOTE - Follow Up Consult  Pharmacy Consult:  Coumadin Indication: atrial fibrillation  Allergies  Allergen Reactions  . Codeine Shortness Of Breath and Rash    Patient Measurements: Height: 5\' 8"  (172.7 cm) Weight: 225 lb 9.6 oz (102.3 kg) IBW/kg (Calculated) : 68.4  Vital Signs: Temp: 98 F (36.7 C) (09/14 0349) Temp Source: Oral (09/14 0349) BP: 134/69 (09/14 0700) Pulse Rate: 82 (09/14 0700)  Labs: Recent Labs    03/31/19 0915  03/31/19 1100  04/01/19 0312 04/01/19 0445 04/02/19 0440 04/03/19 0311  HGB 15.0   < >  --    < > 13.7 13.9  --  14.0  HCT 44.6   < >  --    < > 38.9* 41.0  --  40.8  PLT 201  --   --   --  153  --   --  152  LABPROT 19.6*  --   --   --  22.2*  --  25.4* 24.8*  INR 1.7*  --   --   --  2.0*  --  2.4* 2.3*  CREATININE 1.84*   < >  --   --  2.09*  --  2.17* 1.99*  TROPONINIHS 15  --  36*  --   --   --   --   --    < > = values in this interval not displayed.    Estimated Creatinine Clearance: 39.5 mL/min (A) (by C-G formula based on SCr of 1.99 mg/dL (H)).   Assessment: 69 YOM presented with acute hypoxemic respiratory failure secondary to CHF exacerbation.  Pharmacy consulted to continue Coumadin from PTA for history of AFib.  INR therapeutic; no bleeding reported.  Goal of Therapy:  INR 2-3 Monitor platelets by anticoagulation protocol: Yes   Plan:  Coumadin 2.5mg  PO daily except 1.25mg  on Mon/Thurs as at home Daily PT / INR for now  Elonda Giuliano D. Mina Marble, PharmD, BCPS, [place] 04/03/2019, 7:24 AM

## 2019-04-03 NOTE — Progress Notes (Signed)
ICU Transitions of Care Pharmacist Intervention    [name] is a 73 y.o. male admitted on 03/31/2019  from home, and has been in the intensive care unit for 2d 23h.  Medical History: Past Medical History:  Diagnosis Date  . Acute lower GI bleeding 02/15/2017  . Atrial fibrillation ([place])   . Cardiomyopathy    EF55% 11/14<<35%   . CHF (congestive heart failure) ([place])   . COPD (chronic obstructive pulmonary disease) ([place])   . Essential hypertension   . GERD (gastroesophageal reflux disease)   . Gout   . Hepatitis    Possible history  . History of atrial flutter    Ablation 2012  . 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 Obesity   . Pneumonia X 1  . Primary polycythemia ([place]) 06/12/2013  . Renal insufficiency   . Substance abuse ([place])    History of alcohol; hx cocaine, heroin, crack use  . Syncope      Home Medication List:  Medications Prior to Admission  Medication Sig Dispense Refill Last Dose  . albuterol (PROAIR HFA) 108 (90 Base) MCG/ACT inhaler Inhale 1-2 puffs into the lungs every 6 (six) hours as needed for wheezing or shortness of breath. 18 g 3   . albuterol (PROVENTIL) (2.5 MG/3ML) 0.083% nebulizer solution Take 3 mLs (2.5 mg total) by nebulization 2 (two) times daily as needed for wheezing or shortness of breath. 75 mL 0   . amiodarone (PACERONE) 200 MG tablet Take 0.5 tablets (100 mg total) by mouth daily. 45 tablet 3   . camphor-menthol (SARNA) lotion Apply topically 3 (three) times daily. 222 mL 0   . carvedilol (COREG) 3.125 MG tablet Take 1 tablet (3.125 mg total) by mouth daily. 90 tablet 2   . Fluticasone-Umeclidin-Vilant (TRELEGY ELLIPTA) 100-62.5-25 MCG/INH AEPB Inhale 1 puff into the lungs daily. 60 each 5   . folic acid (FOLVITE) 1 MG tablet Take 1 tablet (1 mg total) by mouth daily. 90 tablet 3   . furosemide (LASIX) 20 MG tablet Take 1 tablet (20 mg total) by mouth 2 (two) times daily. 180 tablet 3   . hydroxyurea (HYDREA) 500 MG  capsule Take 2 capsules (1,000 mg total) by mouth daily. MAY TAKE WITH FOOD TO MINIMIZE GI SIDE EFFECTS (Patient taking differently: Take 1,000 mg by mouth daily. ) 180 capsule 3   . SUBOXONE 8-2 MG FILM Place 1 Film under the tongue daily.      [name] warfarin (COUMADIN) 2.5 MG tablet TAKE 1/2 TABLET BY MOUTH ON TUESDAY AND FRIDAY. TAKE 1 TABLET DAILY ON THE OTHER DAYS (Patient taking differently: Take 1.25-2.5 mg by mouth See admin instructions. Take 1.25 mg on Tuesday and Thursday 2.5 mg all the other days) 30 tablet 2      Has a pharmacist made an intervention on this patient's medication list?  yes, because the patient stayed in medical-surgical ICU for > 48 hours, is discharging from ICU to another level of care within [facility], and has active orders for medications that should be discontinued upon transition out of ICU.    Transitions of care were discussed with Dr. Nelda Marseille and the following interventions were made:  Antipsychotics/anticonvulsants:    Opiates:   Benzodiazepines:   Stress Ulcer Prophylaxis:  A medication for stress ulcer prophylaxis was started during ICU admission and not intended to be continued at discharge. Protonix was discontinued  Antibiotics:   Steroids:  A steroid was started during ICU admission and not intended to  be continued at discharge. No intervention was made for a steroid.  Patient started on steroid 9/14 due to wheezing.  To taper once on the floor.  April 03, 2019  [name] D. Mina Marble, PharmD, BCPS, [place] 04/03/2019, 1:29 PM

## 2019-04-04 ENCOUNTER — Ambulatory Visit (HOSPITAL_COMMUNITY): Payer: Medicare Other | Admitting: Physician Assistant

## 2019-04-04 DIAGNOSIS — I4819 Other persistent atrial fibrillation: Secondary | ICD-10-CM

## 2019-04-04 DIAGNOSIS — I10 Essential (primary) hypertension: Secondary | ICD-10-CM

## 2019-04-04 DIAGNOSIS — I5023 Acute on chronic systolic (congestive) heart failure: Secondary | ICD-10-CM

## 2019-04-04 DIAGNOSIS — D45 Polycythemia vera: Secondary | ICD-10-CM

## 2019-04-04 LAB — BASIC METABOLIC PANEL
Anion gap: 12 (ref 5–15)
BUN: 41 mg/dL — ABNORMAL HIGH (ref 8–23)
CO2: 26 mmol/L (ref 22–32)
Calcium: 8.8 mg/dL — ABNORMAL LOW (ref 8.9–10.3)
Chloride: 101 mmol/L (ref 98–111)
Creatinine, Ser: 1.52 mg/dL — ABNORMAL HIGH (ref 0.61–1.24)
GFR calc Af Amer: 53 mL/min — ABNORMAL LOW (ref >60)
GFR calc non Af Amer: 45 mL/min — ABNORMAL LOW (ref >60)
Glucose, Bld: 160 mg/dL — ABNORMAL HIGH (ref 70–99)
Potassium: 4.2 mmol/L (ref 3.5–5.1)
Sodium: 139 mmol/L (ref 135–145)

## 2019-04-04 LAB — PROTIME-INR
INR: 1.9 — ABNORMAL HIGH (ref 0.8–1.2)
Prothrombin Time: 21.8 seconds — ABNORMAL HIGH (ref 11.4–15.2)

## 2019-04-04 LAB — CBC
HCT: 42.1 % (ref 39.0–52.0)
Hemoglobin: 14.8 g/dL (ref 13.0–17.0)
MCH: 41.8 pg — ABNORMAL HIGH (ref 26.0–34.0)
MCHC: 35.2 g/dL (ref 30.0–36.0)
MCV: 118.9 fL — ABNORMAL HIGH (ref 80.0–100.0)
Platelets: 157 10*3/uL (ref 150–400)
RBC: 3.54 MIL/uL — ABNORMAL LOW (ref 4.22–5.81)
RDW: 13.5 % (ref 11.5–15.5)
WBC: 9.5 10*3/uL (ref 4.0–10.5)
nRBC: 0 % (ref 0.0–0.2)

## 2019-04-04 LAB — GLUCOSE, CAPILLARY
Glucose-Capillary: 161 mg/dL — ABNORMAL HIGH (ref 70–99)
Glucose-Capillary: 112 mg/dL — ABNORMAL HIGH (ref 70–99)

## 2019-04-04 LAB — PHOSPHORUS: Phosphorus: 3.4 mg/dL (ref 2.5–4.6)

## 2019-04-04 LAB — MAGNESIUM: Magnesium: 1.7 mg/dL (ref 1.7–2.4)

## 2019-04-04 MED ORDER — LIVING BETTER WITH HEART FAILURE BOOK
Freq: Once | Status: AC
  Administered 2019-04-04: 15:00:00
  Filled 2019-04-04 (×2): qty 1

## 2019-04-04 MED ORDER — FUROSEMIDE 40 MG PO TABS: 40.0000 mg | ORAL_TABLET | Freq: Two times a day (BID) | ORAL | Status: DC

## 2019-04-04 MED ORDER — MAGNESIUM OXIDE 400 (241.3 MG) MG PO TABS
400.0000 mg | ORAL_TABLET | Freq: Once | ORAL | Status: AC
  Administered 2019-04-04: 400 mg via ORAL
  Filled 2019-04-04: qty 1

## 2019-04-04 MED ORDER — LIVING BETTER WITH HEART FAILURE BOOK: 1.0000 | Freq: Once | 0 refills | Status: AC

## 2019-04-04 MED ORDER — MAGNESIUM SULFATE 2 GM/50ML IV SOLN
2.0000 g | Freq: Once | INTRAVENOUS | Status: DC
  Filled 2019-04-04: qty 50

## 2019-04-04 MED ORDER — FUROSEMIDE 40 MG PO TABS: 40.0000 mg | ORAL_TABLET | Freq: Two times a day (BID) | ORAL | 0 refills | Status: DC

## 2019-04-04 NOTE — Progress Notes (Signed)
PT Cancellation Note  Patient Details Name: [name] MRN: [id number] DOB: May 18, 1946   Cancelled Treatment:    Reason Eval/Treat Not Completed: PT screened, no needs identified, will sign off. Observed pt amb in hallway with OT without difficulty. No need for PT eval.    Shary Decamp [facility] 04/04/2019, 12:48 PM Jackee Glasner [place] Pager [phone] Office [phone]

## 2019-04-04 NOTE — Progress Notes (Signed)
ANTICOAGULATION CONSULT NOTE  Pharmacy Consult:  Coumadin Indication: atrial fibrillation  Allergies  Allergen Reactions  . Codeine Shortness Of Breath and Rash    Patient Measurements: Height: 5\' 8"  (172.7 cm) Weight: 220 lb (99.8 kg) IBW/kg (Calculated) : 68.4  Vital Signs: Temp: 98.2 F (36.8 C) (09/15 0825) Temp Source: Oral (09/15 0825) BP: 136/61 (09/15 0825) Pulse Rate: 83 (09/15 0825)  Labs: Recent Labs    04/02/19 0440 04/03/19 0311 04/04/19 0447  HGB  --  14.0 14.8  HCT  --  40.8 42.1  PLT  --  152 157  LABPROT 25.4* 24.8* 21.8*  INR 2.4* 2.3* 1.9*  CREATININE 2.17* 1.99* 1.52*    Estimated Creatinine Clearance: 51.1 mL/min (A) (by C-G formula based on SCr of 1.52 mg/dL (H)).   Assessment: 89 YOM presented with acute hypoxemic respiratory failure secondary to CHF exacerbation.  Pharmacy consulted to continue Coumadin from PTA for history of AFib.  INR dips down a little bit; no bleeding reported.  Patient is scheduled to get a larger Coumadin dose today.  Goal of Therapy:  INR 2-3 Monitor platelets by anticoagulation protocol: Yes   Plan:  Continue Coumadin 2.5mg  PO daily except 1.25mg  on Mon/Thurs as at home F/U INR in AM and increase Coumadin if needed  Harwood Nall D. Mina Marble, PharmD, BCPS, [place] 04/04/2019, 8:25 AM

## 2019-04-04 NOTE — Care Management Important Message (Signed)
Important Message  Patient Details  Name: [name] MRN: [id number] Date of Birth: 1946/04/10   Medicare Important Message Given:  Yes     Orbie Pyo 04/04/2019, 3:42 PM

## 2019-04-04 NOTE — Progress Notes (Signed)
Nutrition Education Note  RD consulted for nutrition education regarding  CHF.  **RD working remotely**  RD was paged by RN regarding [employer] education consult. Pt had told RN that he has already discussed the heart healthy diet with someone but she was wanting to verify that someone had spoken to him. RD had not spoken with patient, so unsure who provided diet education. RD spoke with pt over the phone and he relayed the same message that someone had already went over the diet with him and he has no questions. Pt seems eager to go home. Reviewed basics of low sodium diet with patient but pt did not ask questions or engage in education.  RD discussed why it is important for patient to adhere to diet recommendations.  Expect fair compliance. RN states he has been reading his heart failure book.  Body mass index is 33.45 kg/m. Pt meets criteria for obesity based on current BMI.  Current diet order is HH/CHO modified, patient is consuming approximately 100% of meals at this time. Labs and medications reviewed. No further nutrition interventions warranted at this time.If additional nutrition issues arise, please re-consult RD.   Ryan Bibles, MS, RD, LDN Inpatient Clinical Dietitian Pager: [phone] After Hours Pager: [phone]

## 2019-04-04 NOTE — Evaluation (Signed)
Occupational Therapy Evaluation Patient Details Name: [name] MRN: [id number] DOB: 25-Apr-1946 Today's Date: 04/04/2019    History of Present Illness Pt is a 73 yo male s/p CHFe, extubated 9/13. PMHx: alcohol abuse, CHF, PNA, substances abuse, gout.   Clinical Impression   Pt [id number] with family and independent; reports retired and sedentary lifestyle. Pt currently modified independent for ADL and mobility in room and in hallway 300 feet. Pt stood at sink for ADL tasks with no difficulty and able to perform LB ADL with figure 4 technique by crossing legs. No assist required for balance. HR <90 BPM with activity; O2 >95% on RA. Pt does not require continued OT skilled services. OT signing off.     Follow Up Recommendations  No OT follow up    Equipment Recommendations  None recommended by OT    Recommendations for Other Services       Precautions / Restrictions Precautions Precautions: None Restrictions Weight Bearing Restrictions: No      Mobility Bed Mobility Overal bed mobility: Modified Independent                Transfers Overall transfer level: Modified independent                    Balance Overall balance assessment: No apparent balance deficits (not formally assessed)                                         ADL either performed or assessed with clinical judgement   ADL Overall ADL's : Modified independent                                       General ADL Comments: Pt stood at sink for ADL tasks with no difficulty and able to perform LB ADL with figure 4 technique by crossing legs. Pt ambulating well in hallway and in room, No assist required for balance.     Vision Baseline Vision/History: No visual deficits Patient Visual Report: No change from baseline Vision Assessment?: No apparent visual deficits     Perception     Praxis      Pertinent Vitals/Pain Pain Assessment: No/denies  pain     Hand Dominance Right   Extremity/Trunk Assessment Upper Extremity Assessment Upper Extremity Assessment: Overall WFL for tasks assessed   Lower Extremity Assessment Lower Extremity Assessment: Overall WFL for tasks assessed   Cervical / Trunk Assessment Cervical / Trunk Assessment: Normal   Communication Communication Communication: No difficulties   Cognition Arousal/Alertness: Awake/alert Behavior During Therapy: WFL for tasks assessed/performed Overall Cognitive Status: Within Functional Limits for tasks assessed                                     General Comments  HR <90 BPM with activity; O2 >95% on RA.    Exercises     Shoulder Instructions      Home Living Family/patient expects to be discharged to:: Private residence Living Arrangements: Spouse/significant other;Children Available Help at Discharge: Family Type of Home: House Home Access: Stairs to enter Technical brewer of Steps: 3   Home Layout: One level     Bathroom Shower/Tub: Teacher, early years/pre: Standard  Home Equipment: Kasandra Knudsen - single point   Additional Comments: uses cane for community distances      Prior Functioning/Environment Level of Independence: Independent        Comments: used a cane at times he reports but not often         OT Problem List: Decreased activity tolerance      OT Treatment/Interventions:      OT Goals(Current goals can be found in the care plan section) Acute Rehab OT Goals Patient Stated Goal: to go home soon OT Goal Formulation: With patient Time For Goal Achievement: 04/18/19  OT Frequency:     Barriers to D/C:            Co-evaluation              AM-PAC OT "6 Clicks" Daily Activity     Outcome Measure Help from another person eating meals?: None Help from another person taking care of personal grooming?: None Help from another person toileting, which includes using toliet, bedpan, or  urinal?: None Help from another person bathing (including washing, rinsing, drying)?: None Help from another person to put on and taking off regular upper body clothing?: None Help from another person to put on and taking off regular lower body clothing?: None 6 Click Score: 24   End of Session Equipment Utilized During Treatment: Gait belt Nurse Communication: Mobility status  Activity Tolerance: Patient tolerated treatment well Patient left: in bed;with call bell/phone within reach  OT Visit Diagnosis: Muscle weakness (generalized) (M62.81)                Time: 1046-1100 OT Time Calculation (min): 14 min Charges:  OT General Charges $OT Visit: 1 Visit OT Evaluation $OT Eval Moderate Complexity: 1 Mod  Darryl Nestle) Marsa Aris OTR/L Acute Rehabilitation Services Pager: [phone] Office: [place] 04/04/2019, 1:35 PM

## 2019-04-04 NOTE — Discharge Summary (Addendum)
Physician Discharge Summary  Yarden Kee Colombani [id number] DOB: [birth year] DOA: 03/31/2019  PCP: Marianna Payment, MD  Admit date: 03/31/2019 Discharge date: 04/04/2019  Admitted From: Home  Disposition:  Home   Recommendations for Outpatient Follow-up and new medication changes:  1. Follow up with Dr. Marianna Payment in 7 days.  2. Furosemide has been increased to 40 mg po bid 3. Had heart failure teaching before discharge.   Home Health: no   Equipment/Devices: no   Discharge Condition: stable  CODE STATUS: full Diet recommendation: heart healthy and salt/ fluid restriction.   Brief/Interim Summary: 73 year old male who presented with dyspnea.  He does have history of cardiomyopathy, morbid obesity, substance abuse, COPD, polycythemia, chronic kidney disease and polysubstance abuse.  Apparently he collapsed while trying to climb the steps of his home entrance, EMS found him cyanotic, he was placed on noninvasive mechanical ventilation and nitroglycerin infusion.  Fortunately he failed noninvasive mechanical ventilation and required intubation.  On his examination on mechanical ventilation his blood pressure was 102/69, heart rate 78, respiratory rate 18, oxygen saturation 95%.  PRVC, PEEP 14, FiO2 80%.  His lungs were showing diffuse wheezing, heart S1-S2 present and rhythmic, abdomen protuberant, 1+ pitting lower extremity edema bilaterally. Sodium 142, potassium 4.5, chloride 108, bicarb 21, glucose 247, BUN 22, creatinine 1.84, BNP 1338, lactic acid 4.7/venous, white count 11.1, hemoglobin 15.0, hematocrit 44.6, platelets 201.  SARS COVID-19 was negative.  Head CT was negative for acute changes.  His a chest x-ray had bilateral interstitial infiltrates, predominantly at bases, positive cephalization of the vasculature.  EKG 109 bpm, normal axis, normal intervals, sinus rhythm, no significant ST segment T wave changes.  Patient was admitted to the hospital with a working diagnosis of acute  hypoxic/hypercapnic respiratory failure, due to acute cardiogenic pulmonary edema  1.  Acute hypoxic/hypercapnic respiratory failure, due to acute cardiogenic pulmonary edema due to acute systolic heart failure decompensation.  07/20 LV function with ejection fraction 35 to 40%, cavity moderately dilated, mild LVH and diffuse hypokinesis. Small pericardial effusion.  Non ischemic cardiomyopathy.  Patient failed noninvasive mechanical ventilation in the emergency department, he was intubated and transferred to the intensive care unit.  Patient received aggressive diuresis with IV furosemide negative fluid balance was achieved (4,485 ml) with significant improvement of his symptoms.  His pulmonary edema resolved and he was liberated from mechanical ventilation September 13, and transition to supplemental oxygen per nasal cannula.  By the time of discharge his oxygen saturation is 96% on room air.  Patient will continue carvedilol for heart failure management, furosemide will be increased from 20 mg twice daily up to 40 mg twice daily.  Close follow-up as an outpatient.  Nutritionist was consulted for heart failure teaching, and written information was given.  Seems like patient is noncompliant with a fluid and salt restricted diet.   2.  Acute kidney injury on chronic kidney disease stage IIIa (base creatinine 1.5). due to ATN/ present on admission Patient received IV furosemide, his peak creatinine was 2.17, at discharge is down to 1.52, potassium 4.2 and serum bicarbonate 26.  Patient will be discharged on an increased dose of furosemide, 40 mg twice daily.  Magnesium 91.7) repleted before discharge.  3.  Paroxysmal atrial fibrillation.  Rate remained well controlled with carvedilol and amiodarone, anticoagulation with warfarin.  His INR remains closed to therapeutic 2.3 and at discharge 1,9.   4.  COPD with acute exacerbation.  Patient received a short course of systemic steroids, and bronchodilator  therapy. Continue bronchodilator therapy at home.   5.  Substance abuse.  Patient will continue Suboxone at discharge.  6. Polycythemia. Continue hydroxyurea.   7. Aspiration pneumonitis/ present on admission. Present on admission, patient placed on supplemental 02 per [facility], no bacterial infection and antibiotics were discontinued.   Discharge Diagnoses:  Principal Problem:   Acute respiratory failure with hypoxia and hypercapnia (HCC) Active Problems:   Atrial fibrillation (HCC)   Hypertension   Polycythemia vera ([place])   Encounter for central line placement   COPD exacerbation (HCC)   Acute respiratory failure with hypoxia (HCC)   Acute on chronic systolic CHF (congestive heart failure) ([place])    Discharge Instructions   Allergies as of 04/04/2019      Reactions   Codeine Shortness Of Breath, Rash      Medication List    TAKE these medications   albuterol 108 (90 Base) MCG/ACT inhaler Commonly known as: ProAir HFA Inhale 1-2 puffs into the lungs every 6 (six) hours as needed for wheezing or shortness of breath.   albuterol (2.5 MG/3ML) 0.083% nebulizer solution Commonly known as: PROVENTIL Take 3 mLs (2.5 mg total) by nebulization 2 (two) times daily as needed for wheezing or shortness of breath.   amiodarone 200 MG tablet Commonly known as: PACERONE Take 0.5 tablets (100 mg total) by mouth daily.   camphor-menthol lotion Commonly known as: SARNA Apply topically 3 (three) times daily.   carvedilol 3.125 MG tablet Commonly known as: COREG Take 1 tablet (3.125 mg total) by mouth daily.   folic acid 1 MG tablet Commonly known as: FOLVITE Take 1 tablet (1 mg total) by mouth daily.   furosemide 40 MG tablet Commonly known as: LASIX Take 1 tablet (40 mg total) by mouth 2 (two) times daily. What changed:   medication strength  how much to take   hydroxyurea 500 MG capsule Commonly known as: HYDREA Take 2 capsules (1,000 mg total) by mouth daily. MAY TAKE WITH  FOOD TO MINIMIZE GI SIDE EFFECTS What changed: additional instructions   Living Better with Heart Failure Book Misc 1 each by Does not apply route once for 1 dose.   Suboxone 8-2 MG Film Generic drug: Buprenorphine HCl-Naloxone HCl Place 1 Film under the tongue daily.   Trelegy Ellipta 100-62.5-25 MCG/INH Aepb Generic drug: Fluticasone-Umeclidin-Vilant Inhale 1 puff into the lungs daily.   warfarin 2.5 MG tablet Commonly known as: COUMADIN Take as directed. If you are unsure how to take this medication, talk to your nurse or doctor. Original instructions: TAKE 1/2 TABLET BY MOUTH ON TUESDAY AND FRIDAY. TAKE 1 TABLET DAILY ON THE OTHER DAYS What changed:   how much to take  how to take this  when to take this  additional instructions       Allergies  Allergen Reactions  . Codeine Shortness Of Breath and Rash    Consultations:     Procedures/Studies: Ct Head Wo Contrast  Result Date: 03/31/2019 CLINICAL DATA:  [place].  Hit head.  Respiratory distress. EXAM: CT HEAD WITHOUT CONTRAST TECHNIQUE: Contiguous axial images were obtained from the base of the skull through the vertex without intravenous contrast. COMPARISON:  02/25/2015 FINDINGS: Brain: Stable age related cerebral atrophy, ventriculomegaly and periventricular white matter disease. No extra-axial fluid collections are identified. No CT findings for acute hemispheric infarction or intracranial hemorrhage. No mass lesions. The brainstem and cerebellum are normal. Vascular: Stable vascular calcifications.  No hyperdense vessels. Skull: No skull fracture or bone lesion. Sinuses/Orbits: Scattered opacified  ethmoid air cells and moderate fluid in the posterior nasopharynx likely from the endotracheal tube. The mastoid air cells and middle ear cavities are clear. The globes are intact. Other: No scalp lesions or hematoma. IMPRESSION: No acute intracranial findings or skull fracture. Electronically Signed   By: Marijo Sanes  M.D.   On: 03/31/2019 14:57   Dg Chest Port 1 View  Result Date: 04/02/2019 CLINICAL DATA:  Evaluate for pneumonia.  CHF exacerbation. EXAM: PORTABLE CHEST 1 VIEW COMPARISON:  04/01/2019 FINDINGS: Left IJ central venous catheter unchanged. Interval removal endotracheal tube and enteric tubes. Lungs are adequately inflated with resolved right midlung density likely resolved atelectasis. Minimal stable left base opacification likely atelectasis. Mild stable cardiomegaly. Remainder of the exam is unchanged. IMPRESSION: Resolved right midlung atelectasis. Minimal stable left basilar atelectasis. Mild cardiomegaly. Left IJ central venous catheter unchanged. Electronically Signed   By: Marin Olp M.D.   On: 04/02/2019 11:10   Dg Chest Port 1 View  Result Date: 04/01/2019 CLINICAL DATA:  Pulmonary edema. EXAM: PORTABLE CHEST 1 VIEW COMPARISON:  03/31/2019, 02/11/2019 and 08/15/2018 FINDINGS: Enteric tube courses into the stomach and off the film as tip is not visualized. Endotracheal tube and left IJ central venous catheter are unchanged. Lungs are adequately inflated with mild opacification over the right midlung likely atelectasis and less likely infection. Subtle curvilinear nodular opacity over the left base not well seen on previous exams. Mild stable cardiomegaly. Remainder of the exam is unchanged. IMPRESSION: Persistent opacification over the right midlung likely atelectasis and less likely infection. Curvilinear nodule opacity over the left base not well seen on prior studies as recommend attention on follow-up. Tubes and lines as described. Electronically Signed   By: Marin Olp M.D.   On: 04/01/2019 08:02   Dg Chest Port 1 View  Result Date: 03/31/2019 CLINICAL DATA:  Stable line and OG tube placement. EXAM: PORTABLE CHEST 1 VIEW COMPARISON:  Earlier same day FINDINGS: 1751 hours. Low lung volumes. Cardiopericardial silhouette is at upper limits of normal for size. The right parahilar airspace  opacity appears decreased in the interval. Endotracheal tube tip is 2.4 cm above the base of the carina. Left IJ central line is new in the interval with tip overlying the mid to distal SVC region. OG tube passes into the stomach although the distal tip is not been included on the film. No evidence for pneumothorax. No pleural effusion. Telemetry leads overlie the chest. IMPRESSION: 1. New left IJ central line tip overlies the mid SVC level. 2. OG tube passes into the stomach although the tip is not been included on the film. 3. Interval decrease in the right parahilar airspace opacity seen previously. Electronically Signed   By: Misty Stanley M.D.   On: 03/31/2019 18:10   Dg Chest Portable 1 View  Result Date: 03/31/2019 CLINICAL DATA:  Intubation EXAM: PORTABLE CHEST 1 VIEW COMPARISON:  03/31/2019, 9:16 a.m. FINDINGS: Interval placement of endotracheal tube, tip positioned above the carina. Esophagogastric tube placement with tip and side port below the diaphragm. Mild cardiomegaly. Redemonstrated pulmonary vascular prominence with interstitial and heterogeneous airspace opacity, most conspicuous in the right midlung. IMPRESSION: Interval placement of endotracheal tube, tip positioned above the carina. Esophagogastric tube placement with tip and side port below the diaphragm. Electronically Signed   By: Eddie Candle M.D.   On: 03/31/2019 11:25   Dg Chest Portable 1 View  Result Date: 03/31/2019 CLINICAL DATA:  Respiratory distress EXAM: PORTABLE CHEST 1 VIEW COMPARISON:  February 11, 2019 FINDINGS: There is cardiomegaly with pulmonary venous hypertension. There is interstitial edema throughout the lungs with patchy airspace opacity in the mid to lower lung regions, likely alveolar edema. There is a small right pleural effusion. No adenopathy evident. Loop recorder noted on the left.  No bone lesions.  No pneumothorax. IMPRESSION: Pulmonary vascular congestion with interstitial and patchy alveolar edema. Small  right pleural effusion. These findings are indicative of congestive heart failure. A degree of superimposed pneumonia in the bases cannot be entirely excluded by radiography. Electronically Signed   By: Lowella Grip III M.D.   On: 03/31/2019 09:37   Dg Abd Portable 1v  Result Date: 03/31/2019 CLINICAL DATA:  OG tube placement. EXAM: PORTABLE ABDOMEN - 1 VIEW COMPARISON:  None. FINDINGS: OG tube tip is in the mid stomach. Bowel gas pattern is normal. No significant bone abnormality. IMPRESSION: OG tube tip in the mid stomach. Electronically Signed   By: Lorriane Shire M.D.   On: 03/31/2019 18:08      Procedures:   Subjective: Patient is feeling better, no nausea or vomiting, his dyspnea has resolved and feels back to his baseline.   Discharge Exam: Vitals:   04/04/19 0825 04/04/19 0907  BP: 136/61   Pulse: 83 83  Resp: 16 16  Temp: 98.2 F (36.8 C)   SpO2: 95% 96%   Vitals:   04/03/19 2305 04/04/19 0600 04/04/19 0825 04/04/19 0907  BP: (!) 143/86  136/61   Pulse: 91  83 83  Resp: 20  16 16   Temp: 98.4 F (36.9 C)  98.2 F (36.8 C)   TempSrc: Oral  Oral   SpO2: 96%  95% 96%  Weight:  99.8 kg    Height:        General: Not in pain or dyspnea  Neurology: Awake and alert, non focal  E ENT: mild pallor, no icterus, oral mucosa moist Cardiovascular: No JVD. S1-S2 present, rhythmic, no gallops, rubs, or murmurs. No lower extremity edema. Pulmonary: positive breath sounds bilaterally, adequate air movement, no wheezing, rhonchi or rales. Gastrointestinal. Abdomen with no organomegaly, non tender, no rebound or guarding Skin. No rashes Musculoskeletal: no joint deformities   The results of significant diagnostics from this hospitalization (including imaging, microbiology, ancillary and laboratory) are listed below for reference.     Microbiology: Recent Results (from the past 240 hour(s))  SARS Coronavirus 2 [facility] order, Performed in [facility] hospital lab)  Nasopharyngeal Nasopharyngeal Swab     Status: None   Collection Time: 03/31/19  9:00 AM   Specimen: Nasopharyngeal Swab  Result Value Ref Range Status   SARS Coronavirus 2 NEGATIVE NEGATIVE Final    Comment: (NOTE) If result is NEGATIVE SARS-CoV-2 target nucleic acids are NOT DETECTED. The SARS-CoV-2 RNA is generally detectable in upper and lower  respiratory specimens during the acute phase of infection. The lowest  concentration of SARS-CoV-2 viral copies this assay can detect is 250  copies / mL. A negative result does not preclude SARS-CoV-2 infection  and should not be used as the sole basis for treatment or other  patient management decisions.  A negative result may occur with  improper specimen collection / handling, submission of specimen other  than nasopharyngeal swab, presence of viral mutation(s) within the  areas targeted by this assay, and inadequate number of viral copies  (<250 copies / mL). A negative result must be combined with clinical  observations, patient history, and epidemiological information. If result is POSITIVE SARS-CoV-2 target nucleic acids  are DETECTED. The SARS-CoV-2 RNA is generally detectable in upper and lower  respiratory specimens dur ing the acute phase of infection.  Positive  results are indicative of active infection with SARS-CoV-2.  Clinical  correlation with patient history and other diagnostic information is  necessary to determine patient infection status.  Positive results do  not rule out bacterial infection or co-infection with other viruses. If result is PRESUMPTIVE POSTIVE SARS-CoV-2 nucleic acids MAY BE PRESENT.   A presumptive positive result was obtained on the submitted specimen  and confirmed on repeat testing.  While 2019 novel coronavirus  (SARS-CoV-2) nucleic acids may be present in the submitted sample  additional confirmatory testing may be necessary for epidemiological  and / or clinical management purposes  to  differentiate between  SARS-CoV-2 and other Sarbecovirus currently known to infect humans.  If clinically indicated additional testing with an alternate test  methodology [phone]) is advised. The SARS-CoV-2 RNA is generally  detectable in upper and lower respiratory sp ecimens during the acute  phase of infection. The expected result is Negative. Fact Sheet for Patients:  StrictlyIdeas.no Fact Sheet for Healthcare Providers: BankingDealers.co.za This test is not yet approved or cleared by the [place] FDA and has been authorized for detection and/or diagnosis of SARS-CoV-2 by FDA under an Emergency Use Authorization (EUA).  This EUA will remain in effect (meaning this test can be used) for the duration of the COVID-19 declaration under Section 564(b)(1) of the Act, 21 U.S.C. section 360bbb-3(b)(1), unless the authorization is terminated or revoked sooner. Performed at [facility], [place] [address] [place], [place] [address]   Culture, blood (routine x 2)     Status: None (Preliminary result)   Collection Time: 03/31/19  9:15 AM   Specimen: BLOOD LEFT FOREARM  Result Value Ref Range Status   Specimen Description BLOOD LEFT FOREARM  Final   Special Requests   Final    BOTTLES DRAWN AEROBIC AND ANAEROBIC Blood Culture adequate volume   Culture   Final    NO GROWTH 4 DAYS Performed at [facility], [place] [address], [place], [place] [address]    Report Status PENDING  Incomplete  Culture, blood (routine x 2)     Status: None (Preliminary result)   Collection Time: 03/31/19 10:10 AM   Specimen: BLOOD RIGHT HAND  Result Value Ref Range Status   Specimen Description BLOOD RIGHT HAND  Final   Special Requests   Final    BOTTLES DRAWN AEROBIC AND ANAEROBIC Blood Culture adequate volume   Culture   Final    NO GROWTH 4 DAYS Performed at [facility], [place] [address] [place], [place] [address]    Report Status  PENDING  Incomplete  MRSA PCR Screening     Status: None   Collection Time: 03/31/19  2:52 PM   Specimen: Nasopharyngeal  Result Value Ref Range Status   MRSA by PCR NEGATIVE NEGATIVE Final    Comment:        The GeneXpert MRSA Assay (FDA approved for NASAL specimens only), is one component of a comprehensive MRSA colonization surveillance program. It is not intended to diagnose MRSA infection nor to guide or monitor treatment for MRSA infections. Performed at [facility], [place] [address], [place], [place] [address]      Labs: BNP (last 3 results) Recent Labs    02/09/19 1806 03/31/19 0915 04/02/19 1457  BNP 419.9* 1,338.6* [id number]*   Basic Metabolic Panel: Recent  Labs  Lab 03/31/19 0915 03/31/19 0923  04/01/19 0312 04/01/19 0445 04/02/19 0440 04/03/19 0311 04/04/19 0447  NA 142 143   < > 144 144 141 140 139  K 4.5 4.4   < > 3.2* 3.5 3.7 3.5 4.2  CL 108 106  --  110  --  103 102 101  CO2 21*  --   --  24  --  28 26 26   GLUCOSE 247* 235*  --  169*  --  127* 99 160*  BUN 22 25*  --  38*  --  52* 40* 41*  CREATININE 1.84* 1.90*  --  2.09*  --  2.17* 1.99* 1.52*  CALCIUM 8.5*  --   --  7.9*  --  8.2* 8.2* 8.8*  MG  --   --   --  2.1  --   --   --  1.7  PHOS  --   --   --   --   --   --   --  3.4   < > = values in this interval not displayed.   Liver Function Tests: Recent Labs  Lab 03/31/19 0915  AST 42*  ALT 32  ALKPHOS 117  BILITOT 1.3*  PROT 7.3  ALBUMIN 3.4*   No results for input(s): LIPASE, AMYLASE in the last 168 hours. No results for input(s): AMMONIA in the last 168 hours. CBC: Recent Labs  Lab 03/31/19 0915  03/31/19 1114 04/01/19 0312 04/01/19 0445 04/03/19 0311 04/04/19 0447  WBC 11.1*  --   --  12.3*  --  8.6 9.5  NEUTROABS 8.4*  --   --   --   --   --   --   HGB 15.0   < > 16.0 13.7 13.9 14.0 14.8  HCT 44.6   < > 47.0 38.9* 41.0 40.8 42.1  MCV 126.7*  --   --  120.8*  --  122.2* 118.9*  PLT 201  --   --  153  --  152 157   <  > = values in this interval not displayed.   Cardiac Enzymes: No results for input(s): CKTOTAL, CKMB, CKMBINDEX, TROPONINI in the last 168 hours. BNP: Invalid input(s): POCBNP CBG: Recent Labs  Lab 04/03/19 1111 04/03/19 1508 04/03/19 2106 04/04/19 0639 04/04/19 1124  GLUCAP 142* 151* 168* 161* 112*   D-Dimer No results for input(s): DDIMER in the last 72 hours. Hgb A1c No results for input(s): HGBA1C in the last 72 hours. Lipid Profile No results for input(s): CHOL, HDL, LDLCALC, TRIG, CHOLHDL, LDLDIRECT in the last 72 hours. Thyroid function studies No results for input(s): TSH, T4TOTAL, T3FREE, THYROIDAB in the last 72 hours.  Invalid input(s): FREET3 Anemia work up No results for input(s): VITAMINB12, FOLATE, FERRITIN, TIBC, IRON, RETICCTPCT in the last 72 hours. Urinalysis    Component Value Date/Time   COLORURINE YELLOW 09/26/2015 1155   APPEARANCEUR CLEAR 09/26/2015 1155   LABSPEC 1.011 09/26/2015 1155   PHURINE 5.0 09/26/2015 1155   GLUCOSEU NEGATIVE 09/26/2015 1155   HGBUR SMALL (A) 09/26/2015 1155   BILIRUBINUR NEGATIVE 09/26/2015 1155   KETONESUR NEGATIVE 09/26/2015 1155   PROTEINUR 30 (A) 09/26/2015 1155   UROBILINOGEN 0.2 12/10/2014 0948   NITRITE NEGATIVE 09/26/2015 1155   LEUKOCYTESUR NEGATIVE 09/26/2015 1155   Sepsis Labs Invalid input(s): PROCALCITONIN,  WBC,  LACTICIDVEN Microbiology Recent Results (from the past 240 hour(s))  SARS Coronavirus 2 [facility] order, Performed in [facility] hospital lab) Nasopharyngeal Nasopharyngeal Swab  Status: None   Collection Time: 03/31/19  9:00 AM   Specimen: Nasopharyngeal Swab  Result Value Ref Range Status   SARS Coronavirus 2 NEGATIVE NEGATIVE Final    Comment: (NOTE) If result is NEGATIVE SARS-CoV-2 target nucleic acids are NOT DETECTED. The SARS-CoV-2 RNA is generally detectable in upper and lower  respiratory specimens during the acute phase of infection. The lowest  concentration of SARS-CoV-2  viral copies this assay can detect is 250  copies / mL. A negative result does not preclude SARS-CoV-2 infection  and should not be used as the sole basis for treatment or other  patient management decisions.  A negative result may occur with  improper specimen collection / handling, submission of specimen other  than nasopharyngeal swab, presence of viral mutation(s) within the  areas targeted by this assay, and inadequate number of viral copies  (<250 copies / mL). A negative result must be combined with clinical  observations, patient history, and epidemiological information. If result is POSITIVE SARS-CoV-2 target nucleic acids are DETECTED. The SARS-CoV-2 RNA is generally detectable in upper and lower  respiratory specimens dur ing the acute phase of infection.  Positive  results are indicative of active infection with SARS-CoV-2.  Clinical  correlation with patient history and other diagnostic information is  necessary to determine patient infection status.  Positive results do  not rule out bacterial infection or co-infection with other viruses. If result is PRESUMPTIVE POSTIVE SARS-CoV-2 nucleic acids MAY BE PRESENT.   A presumptive positive result was obtained on the submitted specimen  and confirmed on repeat testing.  While 2019 novel coronavirus  (SARS-CoV-2) nucleic acids may be present in the submitted sample  additional confirmatory testing may be necessary for epidemiological  and / or clinical management purposes  to differentiate between  SARS-CoV-2 and other Sarbecovirus currently known to infect humans.  If clinically indicated additional testing with an alternate test  methodology [phone]) is advised. The SARS-CoV-2 RNA is generally  detectable in upper and lower respiratory sp ecimens during the acute  phase of infection. The expected result is Negative. Fact Sheet for Patients:  StrictlyIdeas.no Fact Sheet for Healthcare  Providers: BankingDealers.co.za This test is not yet approved or cleared by the [place] FDA and has been authorized for detection and/or diagnosis of SARS-CoV-2 by FDA under an Emergency Use Authorization (EUA).  This EUA will remain in effect (meaning this test can be used) for the duration of the COVID-19 declaration under Section 564(b)(1) of the Act, 21 U.S.C. section 360bbb-3(b)(1), unless the authorization is terminated or revoked sooner. Performed at [facility], [place] [address], [place], [place] [address]   Culture, blood (routine x 2)     Status: None (Preliminary result)   Collection Time: 03/31/19  9:15 AM   Specimen: BLOOD LEFT FOREARM  Result Value Ref Range Status   Specimen Description BLOOD LEFT FOREARM  Final   Special Requests   Final    BOTTLES DRAWN AEROBIC AND ANAEROBIC Blood Culture adequate volume   Culture   Final    NO GROWTH 4 DAYS Performed at [facility], [place] [address], [place],  [address]    Report Status PENDING  Incomplete  Culture, blood (routine x 2)     Status: None (Preliminary result)   Collection Time: 03/31/19 10:10 AM   Specimen: BLOOD RIGHT HAND  Result Value Ref Range Status   Specimen Description BLOOD RIGHT HAND  Final   Special Requests   Final  BOTTLES DRAWN AEROBIC AND ANAEROBIC Blood Culture adequate volume   Culture   Final    NO GROWTH 4 DAYS Performed at [facility], [place] [address], [place], [place] [address]    Report Status PENDING  Incomplete  MRSA PCR Screening     Status: None   Collection Time: 03/31/19  2:52 PM   Specimen: Nasopharyngeal  Result Value Ref Range Status   MRSA by PCR NEGATIVE NEGATIVE Final    Comment:        The GeneXpert MRSA Assay (FDA approved for NASAL specimens only), is one component of a comprehensive MRSA colonization surveillance program. It is not intended to diagnose MRSA infection nor to guide or monitor treatment for MRSA  infections. Performed at [facility], [place] [address] [place], [place] [address]      Time coordinating discharge: 45 minutes  SIGNED:   Tawni Millers, MD  [facility] 04/04/2019, 12:05 PM

## 2019-04-05 LAB — CULTURE, BLOOD (ROUTINE X 2)
Culture: NO GROWTH
Culture: NO GROWTH
Special Requests: ADEQUATE
Special Requests: ADEQUATE

## 2019-04-17 ENCOUNTER — Ambulatory Visit (HOSPITAL_COMMUNITY)
Admission: RE | Admit: 2019-04-17 | Discharge: 2019-04-17 | Disposition: A | Discharge status: Home / Self Care | Payer: Medicare Other | Source: Ambulatory Visit | Attending: Physician Assistant | Admitting: Physician Assistant

## 2019-04-17 ENCOUNTER — Other Ambulatory Visit: Payer: Self-pay

## 2019-04-17 ENCOUNTER — Ambulatory Visit (INDEPENDENT_AMBULATORY_CARE_PROVIDER_SITE_OTHER): Payer: Medicare Other | Admitting: Pharmacist

## 2019-04-17 ENCOUNTER — Encounter (HOSPITAL_COMMUNITY): Payer: Self-pay | Admitting: Physician Assistant

## 2019-04-17 VITALS — BP 114/76 | HR 105 | Ht 68.0 in | Wt 217.2 lb

## 2019-04-17 DIAGNOSIS — Z5181 Encounter for therapeutic drug level monitoring: Secondary | ICD-10-CM | POA: Diagnosis not present

## 2019-04-17 DIAGNOSIS — I4819 Other persistent atrial fibrillation: Secondary | ICD-10-CM | POA: Insufficient documentation

## 2019-04-17 DIAGNOSIS — Z6833 Body mass index (BMI) 33.0-33.9, adult: Secondary | ICD-10-CM | POA: Insufficient documentation

## 2019-04-17 DIAGNOSIS — Z7901 Long term (current) use of anticoagulants: Secondary | ICD-10-CM | POA: Insufficient documentation

## 2019-04-17 DIAGNOSIS — E669 Obesity, unspecified: Secondary | ICD-10-CM | POA: Diagnosis not present

## 2019-04-17 DIAGNOSIS — Z833 Family history of diabetes mellitus: Secondary | ICD-10-CM | POA: Insufficient documentation

## 2019-04-17 DIAGNOSIS — I130 Hypertensive heart and chronic kidney disease with heart failure and stage 1 through stage 4 chronic kidney disease, or unspecified chronic kidney disease: Secondary | ICD-10-CM | POA: Diagnosis not present

## 2019-04-17 DIAGNOSIS — N183 Chronic kidney disease, stage 3 (moderate): Secondary | ICD-10-CM | POA: Diagnosis not present

## 2019-04-17 DIAGNOSIS — R9431 Abnormal electrocardiogram [ECG] [EKG]: Secondary | ICD-10-CM | POA: Insufficient documentation

## 2019-04-17 DIAGNOSIS — I428 Other cardiomyopathies: Secondary | ICD-10-CM | POA: Insufficient documentation

## 2019-04-17 DIAGNOSIS — Z79899 Other long term (current) drug therapy: Secondary | ICD-10-CM | POA: Diagnosis not present

## 2019-04-17 DIAGNOSIS — F1021 Alcohol dependence, in remission: Secondary | ICD-10-CM | POA: Diagnosis not present

## 2019-04-17 DIAGNOSIS — F17210 Nicotine dependence, cigarettes, uncomplicated: Secondary | ICD-10-CM | POA: Diagnosis not present

## 2019-04-17 DIAGNOSIS — J449 Chronic obstructive pulmonary disease, unspecified: Secondary | ICD-10-CM | POA: Diagnosis not present

## 2019-04-17 DIAGNOSIS — Z8249 Family history of ischemic heart disease and other diseases of the circulatory system: Secondary | ICD-10-CM | POA: Insufficient documentation

## 2019-04-17 DIAGNOSIS — I4892 Unspecified atrial flutter: Secondary | ICD-10-CM | POA: Insufficient documentation

## 2019-04-17 DIAGNOSIS — I313 Pericardial effusion (noninflammatory): Secondary | ICD-10-CM | POA: Diagnosis not present

## 2019-04-17 DIAGNOSIS — F1421 Cocaine dependence, in remission: Secondary | ICD-10-CM | POA: Insufficient documentation

## 2019-04-17 DIAGNOSIS — I4891 Unspecified atrial fibrillation: Secondary | ICD-10-CM

## 2019-04-17 DIAGNOSIS — I509 Heart failure, unspecified: Secondary | ICD-10-CM | POA: Insufficient documentation

## 2019-04-17 DIAGNOSIS — Z811 Family history of alcohol abuse and dependence: Secondary | ICD-10-CM | POA: Diagnosis not present

## 2019-04-17 LAB — BASIC METABOLIC PANEL
Anion gap: 10 (ref 5–15)
BUN: 31 mg/dL — ABNORMAL HIGH (ref 8–23)
CO2: 25 mmol/L (ref 22–32)
Calcium: 8.4 mg/dL — ABNORMAL LOW (ref 8.9–10.3)
Chloride: 107 mmol/L (ref 98–111)
Creatinine, Ser: 1.6 mg/dL — ABNORMAL HIGH (ref 0.61–1.24)
GFR calc Af Amer: 49 mL/min — ABNORMAL LOW (ref >60)
GFR calc non Af Amer: 43 mL/min — ABNORMAL LOW (ref >60)
Glucose, Bld: 118 mg/dL — ABNORMAL HIGH (ref 70–99)
Potassium: 4.1 mmol/L (ref 3.5–5.1)
Sodium: 142 mmol/L (ref 135–145)

## 2019-04-17 LAB — POCT INR: INR: 1.6 — AB (ref 2.0–3.0)

## 2019-04-17 MED ORDER — CARVEDILOL 3.125 MG PO TABS: ORAL_TABLET | ORAL | 2 refills | Status: DC

## 2019-04-17 NOTE — Progress Notes (Addendum)
Anticoagulation Management [name] is a 73 y.o. male who reports to the clinic for monitoring of warfarin treatment.    Indication:  Atrial fibrillation, CHF and long term current use of anticoagulant.   Duration: indefinite Supervising physician: [facility] Visit History: Patient does not report signs/symptoms of bleeding or thromboembolism  Other recent changes: No diet, medications, lifestyle changes endorsed at this visit.  Anticoagulation Episode Summary    Current INR goal:  2.0-3.0  TTR:  67.1 % (6.3 y)  Next INR check:  05/08/2019  INR from last check:  1.6 (04/17/2019)  Weekly max warfarin dose:    Target end date:  Indefinite  INR check location:  Anticoagulation Clinic  Preferred lab:    Send INR reminders to:     Indications   Atrial fibrillation ([place]) [I48.91]       Comments:          Allergies  Allergen Reactions  . Codeine Shortness Of Breath and Rash    Current Outpatient Medications:  .  albuterol (PROAIR HFA) 108 (90 Base) MCG/ACT inhaler, Inhale 1-2 puffs into the lungs every 6 (six) hours as needed for wheezing or shortness of breath., Disp: 18 g, Rfl: 3 .  albuterol (PROVENTIL) (2.5 MG/3ML) 0.083% nebulizer solution, Take 3 mLs (2.5 mg total) by nebulization 2 (two) times daily as needed for wheezing or shortness of breath., Disp: 75 mL, Rfl: 0 .  amiodarone (PACERONE) 200 MG tablet, Take 0.5 tablets (100 mg total) by mouth daily., Disp: 45 tablet, Rfl: 3 .  camphor-menthol (SARNA) lotion, Apply topically 3 (three) times daily., Disp: 222 mL, Rfl: 0 .  carvedilol (COREG) 3.125 MG tablet, Take 1 tablet (3.125 mg total) by mouth daily., Disp: 90 tablet, Rfl: 2 .  Fluticasone-Umeclidin-Vilant (TRELEGY ELLIPTA) 100-62.5-25 MCG/INH AEPB, Inhale 1 puff into the lungs daily., Disp: 60 each, Rfl: 5 .  folic acid (FOLVITE) 1 MG tablet, Take 1 tablet (1 mg total) by mouth daily., Disp: 90 tablet, Rfl: 3 .   furosemide (LASIX) 40 MG tablet, Take 1 tablet (40 mg total) by mouth 2 (two) times daily., Disp: 60 tablet, Rfl: 0 .  hydroxyurea (HYDREA) 500 MG capsule, Take 2 capsules (1,000 mg total) by mouth daily. MAY TAKE WITH FOOD TO MINIMIZE GI SIDE EFFECTS (Patient taking differently: Take 1,000 mg by mouth daily. ), Disp: 180 capsule, Rfl: 3 .  SUBOXONE 8-2 MG FILM, Place 1 Film under the tongue daily. , Disp: , Rfl:  .  warfarin (COUMADIN) 2.5 MG tablet, TAKE 1/2 TABLET BY MOUTH ON TUESDAY AND FRIDAY. TAKE 1 TABLET DAILY ON THE OTHER DAYS (Patient taking differently: Take 1.25-2.5 mg by mouth See admin instructions. Take 1.25 mg on Tuesday and Thursday 2.5 mg all the other days), Disp: 30 tablet, Rfl: 2 Past Medical History:  Diagnosis Date  . Acute lower GI bleeding 02/15/2017  . Atrial fibrillation ([place])   . Cardiomyopathy    EF55% 11/14<<35%   . CHF (congestive heart failure) ([place])   . COPD (chronic obstructive pulmonary disease) ([place])   . Essential hypertension   . GERD (gastroesophageal reflux disease)   . Gout   . Hepatitis    Possible history  . History of atrial flutter    Ablation 2012  . 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 Obesity   . Pneumonia X 1  . Primary polycythemia ([place]) 06/12/2013  . Renal insufficiency   . Substance abuse ([place])    History of alcohol; hx  cocaine, heroin, crack use  . Syncope    Social History   Socioeconomic History  . Marital status: Single    Spouse name: Not on file  . Number of children: Not on file  . Years of education: Not on file  . Highest education level: Not on file  Occupational History  . Occupation: Retired  Scientific laboratory technician  . Financial resource strain: Not on file  . Food insecurity    Worry: Not on file    Inability: Not on file  . Transportation needs    Medical: Not on file    Non-medical: Not on file  Tobacco Use  . Smoking status: Current Every Day Smoker    Packs/day: 0.10    Years: 50.00    Pack years:  5.00    Types: Cigarettes  . Smokeless tobacco: Never Used  . Tobacco comment: 2-3 cigs per day  Substance and Sexual Activity  . Alcohol use: Yes    Alcohol/week: 89.0 standard drinks    Types: 12 Cans of beer, 77 Shots of liquor per week    Comment: Sometimes.  . Drug use: Not Currently    Types: "Crack" cocaine, Cocaine, Heroin    Comment: 02/15/2017 "last used crack 4-5 years ago; last used heroin a couple years ago; currently denies any drug use-- on suboxone.   . Sexual activity: Not Currently  Lifestyle  . Physical activity    Days per week: Not on file    Minutes per session: Not on file  . Stress: Not on file  Relationships  . Social Herbalist on phone: Not on file    Gets together: Not on file    Attends religious service: Not on file    Active member of club or organization: Not on file    Attends meetings of clubs or organizations: Not on file    Relationship status: Not on file  Other Topics Concern  . Not on file  Social History Narrative   Moved here from [place]. Lives with common law wife and grandchildren. He is retired from "general labor."   Family History  Problem Relation Age of Onset  . Diabetes Mother   . Hypertension Mother   . Cirrhosis Father   . Alcohol abuse Father   . Colon cancer Neg Hx   . Rectal cancer Neg Hx   . Stomach cancer Neg Hx     ASSESSMENT Recent Results: The most recent result is correlated with 15 mg per week: Lab Results  Component Value Date   INR 1.6 (A) 04/17/2019   INR 1.9 (H) 04/04/2019   INR 2.3 (H) 04/03/2019    Anticoagulation Dosing: Description   Take one (1) tablet of your 2.5mg  green-colored warfarin tablets every day of the week, except take only one-half (1/2) tablet on Mondays . Repeat this regimen weekly.     INR today: Subtherapeutic  PLAN Weekly dose was increased by 8% to 16.25 mg per week  Patient Instructions  Patient instructed to take medications as defined in the  Anti-coagulation Track section of this encounter.  Patient instructed to omit today's dose--he has already taken today's dose.  Patient instructed to take one (1) tablet of your 2.5mg  green-colored warfarin tablets every day of the week, except take only one-half (1/2) tablet on Mondays . Repeat this regimen weekly. Patient verbalized understanding of these instructions.    Patient was seen in the AFib Clinic within the hospital. RN within the AFib Clinic called  to front desk to remind me that patient is on a weekly INRs to prepare for cardioversion and he must be seen weekly with INRs in range to permit this . Patient will be called and advised to return to clinic next Monday 24-Apr-2019.   Patient advised to contact clinic or seek medical attention if signs/symptoms of bleeding or thromboembolism occur.  Patient verbalized understanding by repeating back information and was advised to contact me if further medication-related questions arise. Patient was also provided an information handout.  Follow-up Return in about 3 weeks (around 05/08/2019) for Follow up INR.  Pennie Banter, PharmD, CPP  15 minutes spent face-to-face with the patient during the encounter. 50% of time spent on education, including signs/sx bleeding and clotting, as well as food and drug interactions with warfarin. 50% of time was spent on fingerprick POC INR sample collection,processing, results determination, and documentation in http://www.kim.net/.

## 2019-04-17 NOTE — Progress Notes (Signed)
Primary Care Physician: Marianna Payment, MD Primary Electrophysiologist: Dr Lovena Le Referring Physician: Tommye Standard   [name] is a 73 y.o. male with a history of HTN, AFlutter ablated 2012, persistent AFib, COPD, CKD (III),  CM suspect to be NICM who presents for follow up in the [facility].  Patient recently hospitalized twice with acute respiratory failure. His first hospitalization was thought to be due to COPD exacerbation and PNA while his second was CHF. A 14 day Zio patch was placed which showed 83% AF burden with overall controlled rates. He denies any snoring or significant alcohol use. Patient reports that since his hospitalization, he has felt very well with no heart racing, palpitations, or issues with SOB. He is out of rhythm today but is asymptomatic.  Today, he denies symptoms of palpitations, chest pain, shortness of breath, orthopnea, PND, lower extremity edema, dizziness, presyncope, syncope, snoring, daytime somnolence, bleeding, or neurologic sequela. The patient is tolerating medications without difficulties and is otherwise without complaint today.    Atrial Fibrillation Risk Factors:  he does not have symptoms or diagnosis of sleep apnea. he does not have a history of rheumatic fever. he does not have a history of alcohol use. The patient does not have a history of early familial atrial fibrillation or other arrhythmias.  he has a BMI of Body mass index is 33.03 kg/m.[name] Filed Weights   04/17/19 0921  Weight: 98.5 kg    Family History  Problem Relation Age of Onset  . Diabetes Mother   . Hypertension Mother   . Cirrhosis Father   . Alcohol abuse Father   . Colon cancer Neg Hx   . Rectal cancer Neg Hx   . Stomach cancer Neg Hx      Atrial Fibrillation Management history:  Previous antiarrhythmic drugs: amiodarone Previous cardioversions: none Previous ablations: 2012 flutter CHADS2VASC score: 3  Anticoagulation history: warfarin   Past Medical History:  Diagnosis Date  . Acute lower GI bleeding 02/15/2017  . Atrial fibrillation ([place])   . Cardiomyopathy    EF55% 11/14<<35%   . CHF (congestive heart failure) ([place])   . COPD (chronic obstructive pulmonary disease) ([place])   . Essential hypertension   . GERD (gastroesophageal reflux disease)   . Gout   . Hepatitis    Possible history  . History of atrial flutter    Ablation 2012  . 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 Obesity   . Pneumonia X 1  . Primary polycythemia ([place]) 06/12/2013  . Renal insufficiency   . Substance abuse ([place])    History of alcohol; hx cocaine, heroin, crack use  . Syncope    Past Surgical History:  Procedure Laterality Date  . CARDIAC ELECTROPHYSIOLOGY MAPPING AND ABLATION  08/2010   [name] 09/07/2010 (12/14/2012)  . COLONOSCOPY     15-20 years ago had colon in [place]  . EXCISIONAL HEMORRHOIDECTOMY  1970's  . LOOP RECORDER IMPLANT N/A 08/23/2013   Procedure: LOOP RECORDER IMPLANT;  Surgeon: Deboraha Sprang, MD;  Location[facility] C[facility] LAB;  Service: Cardiovascular;  Laterality: N/A;  . MULTIPLE EXTRACTIONS WITH ALVEOLOPLASTY Bilateral 01/24/2016   Procedure: MULTIPLE EXTRACTION WITH ALVEOLOPLASTY BILATERAL;  Surgeon: Diona Browner, DDS;  Location: [facility];  Service: Oral Surgery;  Laterality: Bilateral;  . MULTIPLE TOOTH EXTRACTIONS  01/24/2016   MULTIPLE EXTRACTION WITH ALVEOLOPLASTY BILATERAL (Bilateral)    Current Outpatient Medications  Medication Sig Dispense Refill  . albuterol (PROAIR HFA) 108 (90 Base) MCG/ACT inhaler Inhale  1-2 puffs into the lungs every 6 (six) hours as needed for wheezing or shortness of breath. 18 g 3  . albuterol (PROVENTIL) (2.5 MG/3ML) 0.083% nebulizer solution Take 3 mLs (2.5 mg total) by nebulization 2 (two) times daily as needed for wheezing or shortness of breath. 75 mL 0  . amiodarone (PACERONE) 200 MG tablet Take 0.5 tablets (100 mg total) by mouth daily. 45 tablet 3   . camphor-menthol (SARNA) lotion Apply topically 3 (three) times daily. 222 mL 0  . carvedilol (COREG) 3.125 MG tablet Take 1 tablet in the morning and 2 tablets in the evening 270 tablet 2  . Fluticasone-Umeclidin-Vilant (TRELEGY ELLIPTA) 100-62.5-25 MCG/INH AEPB Inhale 1 puff into the lungs daily. 60 each 5  . folic acid (FOLVITE) 1 MG tablet Take 1 tablet (1 mg total) by mouth daily. 90 tablet 3  . furosemide (LASIX) 40 MG tablet Take 1 tablet (40 mg total) by mouth 2 (two) times daily. 60 tablet 0  . hydroxyurea (HYDREA) 500 MG capsule Take 2 capsules (1,000 mg total) by mouth daily. MAY TAKE WITH FOOD TO MINIMIZE GI SIDE EFFECTS (Patient taking differently: Take 1,000 mg by mouth daily. ) 180 capsule 3  . SUBOXONE 8-2 MG FILM Place 1 Film under the tongue daily.     [name] warfarin (COUMADIN) 2.5 MG tablet TAKE 1/2 TABLET BY MOUTH ON TUESDAY AND FRIDAY. TAKE 1 TABLET DAILY ON THE OTHER DAYS (Patient taking differently: Take 1.25-2.5 mg by mouth See admin instructions. Take 1.25 mg on Tuesday and Thursday 2.5 mg all the other days) 30 tablet 2   No current facility-administered medications for this encounter.     No Known Allergies  Social History   Socioeconomic History  . Marital status: Single    Spouse name: Not on file  . Number of children: Not on file  . Years of education: Not on file  . Highest education level: Not on file  Occupational History  . Occupation: Retired  Scientific laboratory technician  . Financial resource strain: Not on file  . Food insecurity    Worry: Not on file    Inability: Not on file  . Transportation needs    Medical: Not on file    Non-medical: Not on file  Tobacco Use  . Smoking status: Current Every Day Smoker    Packs/day: 0.10    Years: 50.00    Pack years: 5.00    Types: Cigarettes  . Smokeless tobacco: Never Used  . Tobacco comment: 2-3 cigs per day  Substance and Sexual Activity  . Alcohol use: Yes    Alcohol/week: 89.0 standard drinks    Types: 12  Cans of beer, 77 Shots of liquor per week    Comment: Sometimes.  . Drug use: Not Currently    Types: "Crack" cocaine, Cocaine, Heroin    Comment: 02/15/2017 "last used crack 4-5 years ago; last used heroin a couple years ago; currently denies any drug use-- on suboxone.   . Sexual activity: Not Currently  Lifestyle  . Physical activity    Days per week: Not on file    Minutes per session: Not on file  . Stress: Not on file  Relationships  . Social Herbalist on phone: Not on file    Gets together: Not on file    Attends religious service: Not on file    Active member of club or organization: Not on file    Attends meetings of clubs or organizations: Not  on file    Relationship status: Not on file  . Intimate partner violence    Fear of current or ex partner: Not on file    Emotionally abused: Not on file    Physically abused: Not on file    Forced sexual activity: Not on file  Other Topics Concern  . Not on file  Social History Narrative   Moved here from [place]. Lives with common law wife and grandchildren. He is retired from "general labor."     ROS- All systems are reviewed and negative except as per the HPI above.  Physical Exam: Vitals:   04/17/19 0921  BP: 114/76  Pulse: (!) 105  Weight: 98.5 kg  Height: 5\' 8"  (1.727 m)    GEN- The patient is well appearing obese male, alert and oriented x 3 today.   Head- normocephalic, atraumatic Eyes-  Sclera clear, conjunctiva pink Ears- hearing intact Oropharynx- clear Neck- supple  Lungs- Clear to ausculation bilaterally, normal work of breathing Heart- irregular rate and rhythm, no murmurs, rubs or gallops  GI- soft, NT, ND, + BS Extremities- no clubbing, cyanosis. Trace bilateral edema MS- no significant deformity or atrophy Skin- no rash or lesion Psych- euthymic mood, full affect Neuro- strength and sensation are intact  Wt Readings from Last 3 Encounters:  04/17/19 98.5 kg  04/04/19 99.8 kg   03/14/19 101.6 kg    EKG today demonstrates atypical atrial flutter with variable block vs coarse afib HR 105, QRS 90, QTc 420  Echo 02/10/19 demonstrated   1. The left ventricle has moderately reduced systolic function, with an ejection fraction of 35-40%. The cavity size was moderately dilated. There is mild concentric left ventricular hypertrophy. Left ventricular diastolic function could not be  evaluated secondary to atrial fibrillation. Left ventricular diffuse hypokinesis.  2. The right ventricle has normal systolic function. The cavity was normal. There is no increase in right ventricular wall thickness. Right ventricular systolic pressure is normal with an estimated pressure of 26.7 mmHg.  3. Large pleural effusion in the left lateral region.  4. Small pericardial effusion.  5. The pericardial effusion is posterior to the left ventricle.  6. Mild thickening of the mitral valve leaflet.  7. The aortic valve is tricuspid. Aortic valve regurgitation is moderate by color flow Doppler.  8. The aorta is abnormal in size and structure.  SUMMARY   When compared to the prior study from 06/07/2014 left ventricle is now moderately dilated with moderate diffuse hypokinesis and LVEF 35-40% (previously 60-65%). Moderate aortic regurgitation.  Epic records are reviewed at length today  Assessment and Plan:  1. Persistent atrial fibrillation/atrial flutter Patient remains in afib today but is asymptomatic. Duration is unclear. We discussed therapeutic options including increasing his amiodarone vs DCCV. We discussed that his INR would need to be therapeutic prior to cardioversion, either electrical or pharmacologic.  INR subtherapeutic again today. Continue amiodarone 100 mg daily for now. Continue warfarin. Will need 4 therapeutic weekly INR checks prior to cardioversion. Increase Coreg to 3.125 mg AM and 6.25 mg PM for rate control.  Lifestyle changes as below.  This patients  CHA2DS2-VASc Score and unadjusted Ischemic Stroke Rate (% per year) is equal to 3.2 % stroke rate/year from a score of 3  Above score calculated as 1 point each if present [CHF, HTN, DM, Vascular=MI/PAD/Aortic Plaque, Age if 65-74, or Male] Above score calculated as 2 points each if present [Age > 75, or Stroke/TIA/TE]   2. Obesity Body mass index  is 33.03 kg/m. Lifestyle modification was discussed at length including regular exercise and weight reduction.   3. HTN Stable, med changes as above.  4. NICM No signs or symptoms of fluid overload. Weight stable (down from admission). Continue Coreg and Lasix. Bmet today.   Follow up with Tommye Standard PA-C as scheduled.    [facility] [address] [place], [place] [address] [phone] 04/17/2019 9:49 AM

## 2019-04-17 NOTE — Patient Instructions (Signed)
Increase coreg to 1 tablet in the morning and 2 tablets in the evening.

## 2019-04-17 NOTE — Patient Instructions (Signed)
Patient instructed to take medications as defined in the Anti-coagulation Track section of this encounter.  Patient instructed to omit today's dose--he has already taken today's dose.  Patient instructed to take one (1) tablet of your 2.5mg  green-colored warfarin tablets every day of the week, except take only one-half (1/2) tablet on Mondays . Repeat this regimen weekly. Patient verbalized understanding of these instructions.

## 2019-04-18 ENCOUNTER — Other Ambulatory Visit: Payer: Self-pay | Admitting: Nurse Practitioner

## 2019-04-18 DIAGNOSIS — I4891 Unspecified atrial fibrillation: Secondary | ICD-10-CM

## 2019-04-24 ENCOUNTER — Other Ambulatory Visit: Payer: Self-pay

## 2019-04-24 ENCOUNTER — Ambulatory Visit (INDEPENDENT_AMBULATORY_CARE_PROVIDER_SITE_OTHER): Payer: Medicare Other | Admitting: Internal Medicine

## 2019-04-24 DIAGNOSIS — Z23 Encounter for immunization: Secondary | ICD-10-CM | POA: Diagnosis not present

## 2019-04-24 DIAGNOSIS — Z5181 Encounter for therapeutic drug level monitoring: Secondary | ICD-10-CM

## 2019-04-24 DIAGNOSIS — I4891 Unspecified atrial fibrillation: Secondary | ICD-10-CM | POA: Diagnosis not present

## 2019-04-24 DIAGNOSIS — Z7901 Long term (current) use of anticoagulants: Secondary | ICD-10-CM

## 2019-04-24 LAB — POCT INR: INR: 2.6 (ref 2.0–3.0)

## 2019-04-24 NOTE — Progress Notes (Signed)
INTERNAL MEDICINE TEACHING ATTENDING ADDENDUM - Jayant Kriz M.D  Duration- indefinite, Indication- afib, INR- therapeutic. Agree with pharmacy recommendations as outlined in their note.     

## 2019-04-24 NOTE — Patient Instructions (Signed)
Patient instructed to take medications as defined in the Anti-coagulation Track section of this encounter.  Patient instructed to take today's dose.  Patient instructed to take one (1) tablet of your 2.5mg  green-colored warfarin tablets every day of the week, except take only one-half (1/2) tablet on Mondays .  Patient verbalized understanding of these instructions.

## 2019-04-24 NOTE — Progress Notes (Signed)
Anticoagulation Management [name] is a 73 y.o. male who reports to the clinic for monitoring of warfarin treatment.    Indication: atrial fibrillation  Duration: indefinite Supervising physician: Aldine Contes  Anticoagulation Clinic Visit History: Patient does not report signs/symptoms of bleeding or thromboembolism Other recent changes: no diet, medications, lifestyle changes endorsed at this visit. Anticoagulation Episode Summary    Current INR goal:  2.0-3.0  TTR:  67.1 % (6.3 y)  Next INR check:  05/01/2019  INR from last check:  2.6 (04/24/2019)  Weekly max warfarin dose:    Target end date:  Indefinite  INR check location:  Anticoagulation Clinic  Preferred lab:    Send INR reminders to:     Indications   Atrial fibrillation (HCC) [I48.91]       Comments:          No Known Allergies  Current Outpatient Medications:  .  albuterol (PROAIR HFA) 108 (90 Base) MCG/ACT inhaler, Inhale 1-2 puffs into the lungs every 6 (six) hours as needed for wheezing or shortness of breath., Disp: 18 g, Rfl: 3 .  albuterol (PROVENTIL) (2.5 MG/3ML) 0.083% nebulizer solution, Take 3 mLs (2.5 mg total) by nebulization 2 (two) times daily as needed for wheezing or shortness of breath., Disp: 75 mL, Rfl: 0 .  amiodarone (PACERONE) 200 MG tablet, TAKE 1/2 TABLET BY MOUTH EVERY DAY, Disp: 45 tablet, Rfl: 3 .  camphor-menthol (SARNA) lotion, Apply topically 3 (three) times daily., Disp: 222 mL, Rfl: 0 .  carvedilol (COREG) 3.125 MG tablet, Take 1 tablet in the morning and 2 tablets in the evening, Disp: 270 tablet, Rfl: 2 .  Fluticasone-Umeclidin-Vilant (TRELEGY ELLIPTA) 100-62.5-25 MCG/INH AEPB, Inhale 1 puff into the lungs daily., Disp: 60 each, Rfl: 5 .  folic acid (FOLVITE) 1 MG tablet, Take 1 tablet (1 mg total) by mouth daily., Disp: 90 tablet, Rfl: 3 .  furosemide (LASIX) 40 MG tablet, Take 1 tablet (40 mg total) by mouth 2 (two) times daily., Disp: 60 tablet, Rfl: 0 .   hydroxyurea (HYDREA) 500 MG capsule, Take 2 capsules (1,000 mg total) by mouth daily. MAY TAKE WITH FOOD TO MINIMIZE GI SIDE EFFECTS (Patient taking differently: Take 1,000 mg by mouth daily. ), Disp: 180 capsule, Rfl: 3 .  SUBOXONE 8-2 MG FILM, Place 1 Film under the tongue daily. , Disp: , Rfl:  .  warfarin (COUMADIN) 2.5 MG tablet, TAKE 1/2 TABLET BY MOUTH ON TUESDAY AND FRIDAY. TAKE 1 TABLET DAILY ON THE OTHER DAYS (Patient taking differently: Take 1.25-2.5 mg by mouth See admin instructions. Take 1.25 mg on Tuesday and Thursday 2.5 mg all the other days), Disp: 30 tablet, Rfl: 2 Past Medical History:  Diagnosis Date  . Acute lower GI bleeding 02/15/2017  . Atrial fibrillation ([place])   . Cardiomyopathy    EF55% 11/14<<35%   . CHF (congestive heart failure) ([place])   . COPD (chronic obstructive pulmonary disease) ([place])   . Essential hypertension   . GERD (gastroesophageal reflux disease)   . Gout   . Hepatitis    Possible history  . History of atrial flutter    Ablation 2012  . 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 Obesity   . Pneumonia X 1  . Primary polycythemia ([place]) 06/12/2013  . Renal insufficiency   . Substance abuse ([place])    History of alcohol; hx cocaine, heroin, crack use  . Syncope    Social History   Socioeconomic History  . Marital status: Single  Spouse name: Not on file  . Number of children: Not on file  . Years of education: Not on file  . Highest education level: Not on file  Occupational History  . Occupation: Retired  Scientific laboratory technician  . Financial resource strain: Not on file  . Food insecurity    Worry: Not on file    Inability: Not on file  . Transportation needs    Medical: Not on file    Non-medical: Not on file  Tobacco Use  . Smoking status: Current Every Day Smoker    Packs/day: 0.10    Years: 50.00    Pack years: 5.00    Types: Cigarettes  . Smokeless tobacco: Never Used  . Tobacco comment: 2-3 cigs per day  Substance and Sexual  Activity  . Alcohol use: Yes    Alcohol/week: 89.0 standard drinks    Types: 12 Cans of beer, 77 Shots of liquor per week    Comment: Sometimes.  . Drug use: Not Currently    Types: "Crack" cocaine, Cocaine, Heroin    Comment: 02/15/2017 "last used crack 4-5 years ago; last used heroin a couple years ago; currently denies any drug use-- on suboxone.   . Sexual activity: Not Currently  Lifestyle  . Physical activity    Days per week: Not on file    Minutes per session: Not on file  . Stress: Not on file  Relationships  . Social Herbalist on phone: Not on file    Gets together: Not on file    Attends religious service: Not on file    Active member of club or organization: Not on file    Attends meetings of clubs or organizations: Not on file    Relationship status: Not on file  Other Topics Concern  . Not on file  Social History Narrative   Moved here from [place]. Lives with common law wife and grandchildren. He is retired from "general labor."   Family History  Problem Relation Age of Onset  . Diabetes Mother   . Hypertension Mother   . Cirrhosis Father   . Alcohol abuse Father   . Colon cancer Neg Hx   . Rectal cancer Neg Hx   . Stomach cancer Neg Hx     ASSESSMENT Recent Results: The most recent result is correlated with 16.25 mg per week: Lab Results  Component Value Date   INR 2.6 04/24/2019   INR 1.6 (A) 04/17/2019   INR 1.9 (H) 04/04/2019    Anticoagulation Dosing: Description   Take one (1) tablet of your 2.5mg  green-colored warfarin tablets every day of the week, except take only one-half (1/2) tablet on Mondays .      INR today: Therapeutic  PLAN Weekly dose was unchanged.  Patient Instructions  Patient instructed to take medications as defined in the Anti-coagulation Track section of this encounter.  Patient instructed to take today's dose.  Patient instructed to take one (1) tablet of your 2.5mg  green-colored warfarin tablets every  day of the week, except take only one-half (1/2) tablet on Mondays .  Patient verbalized understanding of these instructions.    Patient advised to contact clinic or seek medical attention if signs/symptoms of bleeding or thromboembolism occur.  Patient verbalized understanding by repeating back information and was advised to contact me if further medication-related questions arise. Patient was also provided an information handout.  Follow-up Return in about 1 week (around 05/01/2019) for Follow up INR.  Pennie Banter,  PharmD, CPP  15 minutes spent face-to-face with the patient during the encounter. 50% of time spent on education, including signs/sx bleeding and clotting, as well as food and drug interactions with warfarin. 50% of time was spent on fingerprick POC INR sample collection,processing, results determination, and documentation in [email or web address]/.

## 2019-04-25 NOTE — Progress Notes (Signed)
Cardiology Office Note Date:  04/25/2019  Patient ID:  [name], [name] Oct 11, 1945, MRN [id number] PCP:  Marianna Payment, MD  Electrophysiologist:  Dr. Lovena Le     Chief Complaint:  post hospital f/u  History of Present Illness: [name] is a 73 y.o. 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 Lovena Le. Last seen by EP service 02/2018 by A. Lynnell Jude, NP.  At that time noting only SR by last few years of EKGs, asymptomatic SB.  He had some edema off his lasix and this was resumed.   He was more recently hospitalized July 23 with sudden onset of SOB, resp failure, required intubation, was COVID negative, initially treated as pneumonia, COPD exacerbation, flash pulm edema, ? CHF.  W/u noted LVEF was 35-40%.  Cardiology was consulted, not felt to have ACS or need for ischemic evaluation.  Not a candidate for ACE/.ARB with renal function.  BP was limtting addition of hydralazine or nitrate Suggested out pt monitoring to evaluate for any potential rhythm abnormalities that may have contributed to his sudden onset SOB/respiratory failure, consideration for outpatient DCCV or perhaps stop amio if rhythm control no longer being pursued. He was discharged 02/13/2019  Long term monitor was ordered though I dont see was done. ?? Placed 8/8 INRs 02/13/2019, 2.5 02/20/2019, 2.9  02/20/2019: Creat 1.47 (looks about his baseline)    I saw him 03/14/2019 in f/u he spoke English well, and reported understands as well, no need for translation.  He reported sine the hospital he has done well.  He remained with some swelling LE unchanged despite the increase in his lasix a few weeks ago by his PMD.  Not any worse either.  He denied CP or palpitations, no cardiac awareness of any kind.  He reporeds initially had some SOB at night that kept  him up, but not regularly and not of late.  He denied rest SOB, he did have DOE though much improved from when he went to the hospital.  No dizzy spells, no near syncope or syncope. He denies any bleeding or signs of bleeding  He was unaware of his Afib though until recently felt to have been maintaining SR by notes and planned to pursue DCCV.  The patient did not want TEE so planned for weekly INRs and AFib clinic in 3 weeks  f/u to arrange DCCV.  Recommended repeat echo once back in SR.  Monitor results were not available at the time of his visit.  Planned for labs and adjustment in his diuretics for edema with instructions to call/seek help if SOB.  His lasix was increased for 3 days and then to resume BID  He was hospitalized 03/31/2019 with acute resp failure failed BIPAP and required intubation >> diuresed and extubated 04/02/2019.  AKI on CKD with Creat trending downwards at time of discharge.  He remained in Afib, discharge INR was 1.9. Home lasix was doubled  He was the Afib clinic 04/17/2019.  He felt well, unaware of his Afib.   TODAY He comes in feeling "good!".  No rest SOB, he does have some DOE.  Not with short walks, he can ambulate about his home without SOB, walked in today he said comfortably, but with longer walks (that he has hard time being speific with) he gets winded.  This is new in this year.  He denies any symptoms or PND or orthopnea. He denies any kind of CP, palpitations or cardiac awareness of any kind.  Not now or historically.  No dizzy spells, no near syncope or syncope  He follows his warfarin with his PMD, denies any bleeding or signs of bleeding  AFib AAD Amiodarone (goes back to at least 2014)  Device information MDT ILR, implanted 08/23/13 for syncope, Reached RRT 10/26/16    Past Medical History:  Diagnosis Date  . Acute lower GI bleeding 02/15/2017  . Atrial fibrillation ([place])   . Cardiomyopathy    EF55% 11/14<<35%   . CHF (congestive heart failure)  ([place])   . COPD (chronic obstructive pulmonary disease) ([place])   . Essential hypertension   . GERD (gastroesophageal reflux disease)   . Gout   . Hepatitis    Possible history  . History of atrial flutter    Ablation 2012  . 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 Obesity   . Pneumonia X 1  . 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 Renal insufficiency   . Substance abuse ([place])    History of alcohol; hx cocaine, heroin, crack use  . Syncope     Past Surgical History:  Procedure Laterality Date  . CARDIAC ELECTROPHYSIOLOGY MAPPING AND ABLATION  08/2010   [name] 09/07/2010 (12/14/2012)  . COLONOSCOPY     15-20 years ago had colon in [place]  . EXCISIONAL HEMORRHOIDECTOMY  1970's  . LOOP RECORDER IMPLANT N/A 08/23/2013   Procedure: LOOP RECORDER IMPLANT;  Surgeon: Deboraha Sprang, MD;  Location: [facility] CATH LAB;  Service: Cardiovascular;  Laterality: N/A;  . MULTIPLE EXTRACTIONS WITH ALVEOLOPLASTY Bilateral 01/24/2016   Procedure: MULTIPLE EXTRACTION WITH ALVEOLOPLASTY BILATERAL;  Surgeon: Diona Browner, DDS;  Location: [facility];  Service: Oral Surgery;  Laterality: Bilateral;  . MULTIPLE TOOTH EXTRACTIONS  01/24/2016   MULTIPLE EXTRACTION WITH ALVEOLOPLASTY BILATERAL (Bilateral)    Current Outpatient Medications  Medication Sig Dispense Refill  . albuterol (PROAIR HFA) 108 (90 Base) MCG/ACT inhaler Inhale 1-2 puffs into the lungs every 6 (six) hours as needed for wheezing or shortness of breath. 18 g 3  . albuterol (PROVENTIL) (2.5 MG/3ML) 0.083% nebulizer solution Take 3 mLs (2.5 mg total) by nebulization 2 (two) times daily as needed for wheezing or shortness of breath. 75 mL 0  . amiodarone (PACERONE) 200 MG tablet TAKE 1/2 TABLET BY MOUTH EVERY DAY 45 tablet 3  . camphor-menthol (SARNA) lotion Apply topically 3 (three) times daily. 222 mL 0  . carvedilol (COREG) 3.125 MG tablet Take 1 tablet in the morning and 2 tablets in the evening 270 tablet 2  . Fluticasone-Umeclidin-Vilant (TRELEGY  ELLIPTA) 100-62.5-25 MCG/INH AEPB Inhale 1 puff into the lungs daily. 60 each 5  . folic acid (FOLVITE) 1 MG tablet Take 1 tablet (1 mg total) by mouth daily. 90 tablet 3  . furosemide (LASIX) 40 MG tablet Take 1 tablet (40 mg total) by mouth 2 (two) times daily. 60 tablet 0  . hydroxyurea (HYDREA) 500 MG capsule Take 2 capsules (1,000 mg total) by mouth daily. MAY TAKE WITH FOOD TO MINIMIZE GI SIDE EFFECTS (Patient taking differently: Take 1,000 mg by mouth daily. ) 180 capsule 3  . SUBOXONE 8-2 MG FILM Place 1 Film under the tongue daily.     [name] warfarin (COUMADIN) 2.5 MG tablet TAKE 1/2 TABLET BY MOUTH ON TUESDAY AND FRIDAY. TAKE 1 TABLET DAILY ON THE OTHER DAYS (Patient taking differently: Take  1.25-2.5 mg by mouth See admin instructions. Take 1.25 mg on Tuesday and Thursday 2.5 mg all the other days) 30 tablet 2   No current facility-administered medications for this visit.     Allergies:   Patient has no known allergies.   Social History:  The patient  reports that he has been smoking cigarettes. He has a 5.00 pack-year smoking history. He has never used smokeless tobacco. He reports current alcohol use of about 89.0 standard drinks of alcohol per week. He reports previous drug use. Drugs: "Crack" cocaine, Cocaine, and Heroin.   Family History:  The patient's family history includes Alcohol abuse in his father; Cirrhosis in his father; Diabetes in his mother; Hypertension in his mother.  ROS:  Please see the history of present illness.  All other systems are reviewed and otherwise negative.   PHYSICAL EXAM:  VS:  There were no vitals taken for this visit. BMI: There is no height or weight on file to calculate BMI. Well nourished, well developed, in no acute distress  HEENT: normocephalic, atraumatic  Neck: no JVD, carotid bruits or masses Cardiac:  RRR; no significant murmurs, no rubs, or gallops Lungs:  CTA b/l, no wheezing is noted today, no rhonchi or rales  Abd: soft, nontender  MS: no deformity or atrophy, walks with a cane Ext: 1++ edema to mid shin b/l, chronic looking skin changes Skin: warm and dry, no rash Neuro:  No gross deficits appreciated Psych: euthymic mood, full affect  ILR site is stable    EKG:  Done today and reviewed by myself shows  SR, 66bpm   Echocardiogram 02/10/2019: 1. The left ventricle has moderately reduced systolic function, with an ejection fraction of 35-40%. The cavity size was moderately dilated. There is mild concentric left ventricular hypertrophy. Left ventricular diastolic function could not be  evaluated secondary to atrial fibrillation. Left ventricular diffuse hypokinesis. 2. The right ventricle has normal systolic function. The cavity was normal. There is no increase in right ventricular wall thickness. Right ventricular systolic pressure is normal with an estimated pressure of 26.7 mmHg. 3. Large pleural effusion in the left lateral region. 4. Small pericardial effusion. 5. The pericardial effusion is posterior to the left ventricle. 6. Mild thickening of the mitral valve leaflet. 7. The aortic valve is tricuspid. Aortic valve regurgitation is moderate by color flow Doppler. 8. The aorta is abnormal in size and structure.  06/07/14: LVEF 60-65% 04/29/14; LVEF 50-55% 06/09/13; LVEF 50-55% 08/31/12: LVEF 40-45%     Recent Labs: 03/14/2019: TSH 4.920 03/31/2019: ALT 32 04/02/2019: B Natriuretic Peptide 339.7 04/04/2019: Hemoglobin 14.8; Magnesium 1.7; Platelets 157 04/17/2019: BUN 31; Creatinine, Ser 1.60; Potassium 4.1; Sodium 142  02/10/2019: Triglycerides 144   Estimated Creatinine Clearance: 48.2 mL/min (A) (by C-G formula based on SCr of 1.6 mg/dL (H)).   Wt Readings from Last 3 Encounters:  04/17/19 217 lb 3.2 oz (98.5 kg)  04/04/19 220 lb (99.8 kg)  03/14/19 224 lb (101.6 kg)     Other studies reviewed: Additional studies/records reviewed today include: summarized above  ASSESSMENT AND PLAN:   1. Persistent AFib     CHA2DS2Vasc is 3, on warfarin, monitored and managed by his PMD     He is in SR today Given his CM (suspect to be 2/2 tachy/AFib), rhythm control strategy I think is worth it. Will increase his amiodarone to 200mg  daily for 2 weeks then back to 1/2 tab daily     2. CM 3. Chronic CHF (systolic)  His LVEF is down by his last echo, recurrent flash pulm edema/respiratory failure that has required intubation Will leave him on the higher diuretic dose, BMET today If renal function will allow, would like to get him on an ACE/ARB Hopefully maintain SR that should help as well  Down a pound from hospital discharge (down 6 from our last visit)   He has COPD, has been on amiodarone for years, will refer to pulmonary for formal evaluation Reduced LVEF/CHF I suspect the bigger etiology for his recent respiratory issues Will have him referred to pulmonary though to see if COPD/amiodarone    4. HTN     Had some relative hypotension in the hospital last month     Looks OK today    Disposition:   As above, back in office 4-6 weeks, sooner if needed     Current medicines are reviewed at length with the patient today.  The patient did not have any concerns regarding medicines.    Venetia Night, PA-C 04/25/2019 8:07 AM     [address] [place]  [address] [phone] (office)  [phone] (fax)

## 2019-04-27 ENCOUNTER — Ambulatory Visit (INDEPENDENT_AMBULATORY_CARE_PROVIDER_SITE_OTHER): Payer: Medicare Other | Admitting: Physician Assistant

## 2019-04-27 ENCOUNTER — Other Ambulatory Visit: Payer: Self-pay

## 2019-04-27 ENCOUNTER — Encounter: Payer: Self-pay | Admitting: Physician Assistant

## 2019-04-27 VITALS — BP 118/60 | HR 74 | Ht 66.0 in | Wt 218.0 lb

## 2019-04-27 DIAGNOSIS — N183 Chronic kidney disease, stage 3 unspecified: Secondary | ICD-10-CM | POA: Diagnosis not present

## 2019-04-27 DIAGNOSIS — J449 Chronic obstructive pulmonary disease, unspecified: Secondary | ICD-10-CM | POA: Diagnosis not present

## 2019-04-27 DIAGNOSIS — I4819 Other persistent atrial fibrillation: Secondary | ICD-10-CM | POA: Diagnosis not present

## 2019-04-27 DIAGNOSIS — I420 Dilated cardiomyopathy: Secondary | ICD-10-CM

## 2019-04-27 DIAGNOSIS — Z79899 Other long term (current) drug therapy: Secondary | ICD-10-CM | POA: Diagnosis not present

## 2019-04-27 DIAGNOSIS — I10 Essential (primary) hypertension: Secondary | ICD-10-CM

## 2019-04-27 DIAGNOSIS — I5022 Chronic systolic (congestive) heart failure: Secondary | ICD-10-CM

## 2019-04-27 LAB — BASIC METABOLIC PANEL
BUN/Creatinine Ratio: 14 (ref 10–24)
BUN: 20 mg/dL (ref 8–27)
CO2: 25 mmol/L (ref 20–29)
Calcium: 8.6 mg/dL (ref 8.6–10.2)
Chloride: 104 mmol/L (ref 96–106)
Creatinine, Ser: 1.47 mg/dL — ABNORMAL HIGH (ref 0.76–1.27)
GFR calc Af Amer: 55 mL/min/{1.73_m2} — ABNORMAL LOW (ref >59)
GFR calc non Af Amer: 47 mL/min/{1.73_m2} — ABNORMAL LOW (ref >59)
Glucose: 140 mg/dL — ABNORMAL HIGH (ref 65–99)
Potassium: 4.2 mmol/L (ref 3.5–5.2)
Sodium: 142 mmol/L (ref 134–144)

## 2019-04-27 NOTE — Patient Instructions (Addendum)
Medication Instructions:   TAKE AMIODARONE 200 MG ONCE A DAY FOR 2 WEEKS   THEN BACK TO HALF A TABLET  ONCE A DAY    If you need a refill on your cardiac medications before your next appointment, please call your pharmacy.   Lab work: BMET TODAY   If you have labs (blood work) drawn today and your tests are completely normal, you will receive your results only by: [name] MyChart Message (if you have MyChart) OR . A paper copy in the mail If you have any lab test that is abnormal or we need to change your treatment, we will call you to review the results.  Testing/Procedures: NONE ORDERED  TODAY    Follow-Up:   Referral to Pulmonology for COPD and amiodarone therapy   At [facility], you and your health needs are our priority.  As part of our continuing mission to provide you with exceptional heart care, we have created designated Provider Care Teams.  These Care Teams include your primary Cardiologist (physician) and Advanced Practice Providers (APPs -  Physician Assistants and Nurse Practitioners) who all work together to provide you with the care you need, when you need it. You will need a follow up appointment in 4-6 weeks.  Please call our office 2 months in advance to schedule this appointment.  You may see Cristopher Peru, MD  or one of the following Advanced Practice Providers on your designated Care Team:   . Tommye Standard, PA-C  Any Other Special Instructions Will Be Listed Below (If Applicable).

## 2019-05-01 ENCOUNTER — Ambulatory Visit (INDEPENDENT_AMBULATORY_CARE_PROVIDER_SITE_OTHER): Payer: Medicare Other | Admitting: Pharmacist

## 2019-05-01 ENCOUNTER — Other Ambulatory Visit: Payer: Self-pay

## 2019-05-01 DIAGNOSIS — I4891 Unspecified atrial fibrillation: Secondary | ICD-10-CM

## 2019-05-01 DIAGNOSIS — Z7901 Long term (current) use of anticoagulants: Secondary | ICD-10-CM

## 2019-05-01 DIAGNOSIS — Z5181 Encounter for therapeutic drug level monitoring: Secondary | ICD-10-CM | POA: Diagnosis not present

## 2019-05-01 LAB — POCT INR: INR: 2.4 (ref 2.0–3.0)

## 2019-05-01 MED ORDER — WARFARIN SODIUM 2.5 MG PO TABS: 2.5000 mg | ORAL_TABLET | Freq: Every day | ORAL | 2 refills | Status: DC

## 2019-05-01 NOTE — Progress Notes (Signed)
INTERNAL MEDICINE TEACHING ATTENDING ADDENDUM - [name] M.D  Duration- indefinite, Indication- afib, INR- therapeutic. Agree with pharmacy recommendations as outlined in their note.     

## 2019-05-01 NOTE — Progress Notes (Signed)
Anticoagulation Management [name] is a 73 y.o. male who reports to the clinic for monitoring of warfarin treatment.    Indication: atrial fibrillation   Duration: indefinite Supervising physician: Aldine Contes  Anticoagulation Clinic Visit History: Patient does not report signs/symptoms of bleeding or thromboembolism  Other recent changes: No diet, medications, lifestyle changes endorsed by the patient at this visit.  Anticoagulation Episode Summary    Current INR goal:  2.0-3.0  TTR:  67.2 % (6.3 y)  Next INR check:  05/08/2019  INR from last check:  2.4 (05/01/2019)  Weekly max warfarin dose:    Target end date:  Indefinite  INR check location:  Anticoagulation Clinic  Preferred lab:    Send INR reminders to:     Indications   Atrial fibrillation (HCC) [I48.91]       Comments:          No Known Allergies  Current Outpatient Medications:  .  albuterol (PROAIR HFA) 108 (90 Base) MCG/ACT inhaler, Inhale 1-2 puffs into the lungs every 6 (six) hours as needed for wheezing or shortness of breath., Disp: 18 g, Rfl: 3 .  albuterol (PROVENTIL) (2.5 MG/3ML) 0.083% nebulizer solution, Take 3 mLs (2.5 mg total) by nebulization 2 (two) times daily as needed for wheezing or shortness of breath., Disp: 75 mL, Rfl: 0 .  amiodarone (PACERONE) 200 MG tablet, TAKE 1/2 TABLET BY MOUTH EVERY DAY, Disp: 45 tablet, Rfl: 3 .  camphor-menthol (SARNA) lotion, Apply topically 3 (three) times daily., Disp: 222 mL, Rfl: 0 .  carvedilol (COREG) 3.125 MG tablet, Take 1 tablet in the morning and 2 tablets in the evening, Disp: 270 tablet, Rfl: 2 .  Fluticasone-Umeclidin-Vilant (TRELEGY ELLIPTA) 100-62.5-25 MCG/INH AEPB, Inhale 1 puff into the lungs daily., Disp: 60 each, Rfl: 5 .  folic acid (FOLVITE) 1 MG tablet, Take 1 tablet (1 mg total) by mouth daily., Disp: 90 tablet, Rfl: 3 .  furosemide (LASIX) 40 MG tablet, Take 1 tablet (40 mg total) by mouth 2 (two) times daily., Disp: 60  tablet, Rfl: 0 .  hydroxyurea (HYDREA) 500 MG capsule, Take 2 capsules (1,000 mg total) by mouth daily. MAY TAKE WITH FOOD TO MINIMIZE GI SIDE EFFECTS (Patient taking differently: Take 1,000 mg by mouth daily. ), Disp: 180 capsule, Rfl: 3 .  SUBOXONE 8-2 MG FILM, Place 1 Film under the tongue daily. , Disp: , Rfl:  .  warfarin (COUMADIN) 2.5 MG tablet, TAKE 1/2 TABLET BY MOUTH ON TUESDAY AND FRIDAY. TAKE 1 TABLET DAILY ON THE OTHER DAYS (Patient taking differently: Take 1.25-2.5 mg by mouth See admin instructions. Take 1.25 mg on Tuesday and Thursday 2.5 mg all the other days), Disp: 30 tablet, Rfl: 2 Past Medical History:  Diagnosis Date  . Acute lower GI bleeding 02/15/2017  . Atrial fibrillation ([place])   . Cardiomyopathy    EF55% 11/14<<35%   . CHF (congestive heart failure) ([place])   . COPD (chronic obstructive pulmonary disease) ([place])   . Essential hypertension   . GERD (gastroesophageal reflux disease)   . Gout   . Hepatitis    Possible history  . History of atrial flutter    Ablation 2012  . 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 Obesity   . Pneumonia X 1  . Primary polycythemia ([place]) 06/12/2013  . Renal insufficiency   . Substance abuse ([place])    History of alcohol; hx cocaine, heroin, crack use  . Syncope    Social History   Socioeconomic History  .  Marital status: Single    Spouse name: Not on file  . Number of children: Not on file  . Years of education: Not on file  . Highest education level: Not on file  Occupational History  . Occupation: Retired  Scientific laboratory technician  . Financial resource strain: Not on file  . Food insecurity    Worry: Not on file    Inability: Not on file  . Transportation needs    Medical: Not on file    Non-medical: Not on file  Tobacco Use  . Smoking status: Current Every Day Smoker    Packs/day: 0.10    Years: 50.00    Pack years: 5.00    Types: Cigarettes  . Smokeless tobacco: Never Used  . Tobacco comment: 2-3 cigs per day   Substance and Sexual Activity  . Alcohol use: Yes    Alcohol/week: 89.0 standard drinks    Types: 12 Cans of beer, 77 Shots of liquor per week    Comment: Sometimes.  . Drug use: Not Currently    Types: "Crack" cocaine, Cocaine, Heroin    Comment: 02/15/2017 "last used crack 4-5 years ago; last used heroin a couple years ago; currently denies any drug use-- on suboxone.   . Sexual activity: Not Currently  Lifestyle  . Physical activity    Days per week: Not on file    Minutes per session: Not on file  . Stress: Not on file  Relationships  . Social Herbalist on phone: Not on file    Gets together: Not on file    Attends religious service: Not on file    Active member of club or organization: Not on file    Attends meetings of clubs or organizations: Not on file    Relationship status: Not on file  Other Topics Concern  . Not on file  Social History Narrative   Moved here from [place]. Lives with common law wife and grandchildren. He is retired from "general labor."   Family History  Problem Relation Age of Onset  . Diabetes Mother   . Hypertension Mother   . Cirrhosis Father   . Alcohol abuse Father   . Colon cancer Neg Hx   . Rectal cancer Neg Hx   . Stomach cancer Neg Hx     ASSESSMENT Recent Results: The most recent result is correlated with 16.25 mg per week: Lab Results  Component Value Date   INR 2.4 05/01/2019   INR 2.6 04/24/2019   INR 1.6 (A) 04/17/2019    Anticoagulation Dosing: Description   Take one (1) tablet of your 2.5mg  green-colored warfarin tablets every day of the week.       INR today: Therapeutic  PLAN Weekly dose was increased by 8% to 17.5 mg per week  Patient Instructions  Patient instructed to take medications as defined in the Anti-coagulation Track section of this encounter.  Patient instructed to take today's dose.  Patient instructed to take one (1) tablet of your 2.5mg  green-colored warfarin tablets every day of  the week.  Patient verbalized understanding of these instructions.    Patient advised to contact clinic or seek medical attention if signs/symptoms of bleeding or thromboembolism occur.  Patient verbalized understanding by repeating back information and was advised to contact me if further medication-related questions arise. Patient was also provided an information handout.  Follow-up Return in 1 week (on 05/08/2019) for Follow up INR.  Pennie Banter, PharmD, CPP  15 minutes spent  face-to-face with the patient during the encounter. 50% of time spent on education, including signs/sx bleeding and clotting, as well as food and drug interactions with warfarin. 50% of time was spent on fingerprick POC INR sample collection,processing, results determination, and documentation in http://www.kim.net/.

## 2019-05-01 NOTE — Addendum Note (Signed)
Addended by: [name] B on: 05/01/2019 08:39 AM   Modules accepted: Orders

## 2019-05-01 NOTE — Patient Instructions (Signed)
Patient instructed to take medications as defined in the Anti-coagulation Track section of this encounter.  Patient instructed to take today's dose.  Patient instructed to take one (1) tablet of your 2.5mg  green-colored warfarin tablets every day of the week.  Patient verbalized understanding of these instructions.

## 2019-05-02 ENCOUNTER — Encounter: Payer: Self-pay | Admitting: Pulmonary Disease

## 2019-05-02 ENCOUNTER — Ambulatory Visit (INDEPENDENT_AMBULATORY_CARE_PROVIDER_SITE_OTHER): Payer: Medicare Other | Admitting: Pulmonary Disease

## 2019-05-02 VITALS — BP 120/78 | HR 72 | Temp 97.3°F | Ht 66.0 in | Wt 219.2 lb

## 2019-05-02 DIAGNOSIS — F17210 Nicotine dependence, cigarettes, uncomplicated: Secondary | ICD-10-CM

## 2019-05-02 DIAGNOSIS — J449 Chronic obstructive pulmonary disease, unspecified: Secondary | ICD-10-CM

## 2019-05-02 DIAGNOSIS — Z720 Tobacco use: Secondary | ICD-10-CM

## 2019-05-02 DIAGNOSIS — R0683 Snoring: Secondary | ICD-10-CM | POA: Diagnosis not present

## 2019-05-02 LAB — CBC WITH DIFFERENTIAL/PLATELET
Basophils Absolute: 0 10*3/uL (ref 0.0–0.1)
Basophils Relative: 0.8 % (ref 0.0–3.0)
Eosinophils Absolute: 0.1 10*3/uL (ref 0.0–0.7)
Eosinophils Relative: 2.8 % (ref 0.0–5.0)
HCT: 43.1 % (ref 39.0–52.0)
Hemoglobin: 14.9 g/dL (ref 13.0–17.0)
Lymphocytes Relative: 27.3 % (ref 12.0–46.0)
Lymphs Abs: 1.1 10*3/uL (ref 0.7–4.0)
MCHC: 34.6 g/dL (ref 30.0–36.0)
MCV: 121.8 fl — ABNORMAL HIGH (ref 78.0–100.0)
Monocytes Absolute: 0.4 10*3/uL (ref 0.1–1.0)
Monocytes Relative: 11 % (ref 3.0–12.0)
Neutro Abs: 2.3 10*3/uL (ref 1.4–7.7)
Neutrophils Relative %: 58.1 % (ref 43.0–77.0)
Platelets: 183 10*3/uL (ref 150.0–400.0)
RBC: 3.54 Mil/uL — ABNORMAL LOW (ref 4.22–5.81)
RDW: 14.6 % (ref 11.5–15.5)
WBC: 3.9 10*3/uL — ABNORMAL LOW (ref 4.0–10.5)

## 2019-05-02 NOTE — Patient Instructions (Signed)
Continue the Trelegy inhaler Make sure you to quit smoking as soon as possible. We will check your oxygen levels on exertion We will refer you for low-dose screening CT of the chest We will check some baseline labs today including CBC differential, IgE, alpha-1 antitrypsin levels and phenotype  Follow-up in 1 to 2 months with pulmonary function test.

## 2019-05-02 NOTE — Progress Notes (Signed)
[name]    [id number]    1945/12/23  Primary Care Physician:Coe, [name], MD  Referring Physician: Baldwin Jamaica, PA-C [address] [place],  [place] [address]  Chief complaint: Consult for COPD  HPI: 73 year old with history of hypertension, a flutter, persistent atrial fibrillation, polycythemia, COPD, chronic kidney disease, nonischemic cardiomyopathy.  History of narcotic abuse on Suboxone.  Referred for evaluation of COPD  Hospitalized in July and September 2020 with respiratory failure, requiring intubation.  COVID-19 tests were negative.  Treated for pulmonary edema, AECOPD and pneumonitis.  Echocardiogram shows EF 35-40% which was thought to be due to nonischemic cardiomyopathy. Symptoms improved with aggressive diuresis and was extubated.  Also seen by cardiology for persistent atrial fibrillation.  Patient is on amiodarone and rate control with Coreg.  Per patient he has been on amiodarone for at least a year.  Discharged at last visit with Trelegy inhaler.  He was previously not on any inhalers.  PFTs in 2015 showed severe COPD.  Current symptoms are chronic dyspnea on exertion.  Denies any cough, sputum production, fevers, chills Also has history of polycythemia with Jak 2 mutation and is on hydroxyurea for this.  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  No mold, hot tub, Jacuzzi, down pillows or comforters Smoking history: 40-60-pack-year smoking history.  Continues to smoke Travel history: Originally from [place].  Previously lived in [place].  No significant recent travel Relevant family history: No significant family history of lung disease  Outpatient Encounter Medications as of 05/02/2019  Medication Sig  . albuterol (PROAIR HFA) 108 (90 Base) MCG/ACT inhaler Inhale 1-2 puffs into the lungs every 6 (six) hours as needed for wheezing or shortness of  breath.  [name] albuterol (PROVENTIL) (2.5 MG/3ML) 0.083% nebulizer solution Take 3 mLs (2.5 mg total) by nebulization 2 (two) times daily as needed for wheezing or shortness of breath.  [name] amiodarone (PACERONE) 200 MG tablet TAKE 1/2 TABLET BY MOUTH EVERY DAY  . camphor-menthol (SARNA) lotion Apply topically 3 (three) times daily.  . carvedilol (COREG) 3.125 MG tablet Take 1 tablet in the morning and 2 tablets in the evening  . Fluticasone-Umeclidin-Vilant (TRELEGY ELLIPTA) 100-62.5-25 MCG/INH AEPB Inhale 1 puff into the lungs daily.  . folic acid (FOLVITE) 1 MG tablet Take 1 tablet (1 mg total) by mouth daily.  . furosemide (LASIX) 40 MG tablet Take 1 tablet (40 mg total) by mouth 2 (two) times daily.  . hydroxyurea (HYDREA) 500 MG capsule Take 2 capsules (1,000 mg total) by mouth daily. MAY TAKE WITH FOOD TO MINIMIZE GI SIDE EFFECTS (Patient taking differently: Take 1,000 mg by mouth daily. )  . SUBOXONE 8-2 MG FILM Place 1 Film under the tongue daily.   [name] warfarin (COUMADIN) 2.5 MG tablet Take 1 tablet (2.5 mg total) by mouth daily at 6 PM.   No facility-administered encounter medications on file as of 05/02/2019.     Allergies as of 05/02/2019  . (No Known Allergies)    Past Medical History:  Diagnosis Date  . Acute lower GI bleeding 02/15/2017  . Atrial fibrillation ([place])   . Cardiomyopathy    EF55% 11/14<<35%   . CHF (congestive heart failure) ([place])   . COPD (chronic obstructive pulmonary disease) ([place])   . Essential hypertension   . GERD (gastroesophageal reflux disease)   . Gout   . Hepatitis  Possible history  . History of atrial flutter    Ablation 2012  . 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 Obesity   . Pneumonia X 1  . Primary polycythemia ([place]) 06/12/2013  . Renal insufficiency   . Substance abuse ([place])    History of alcohol; hx cocaine, heroin, crack use  . Syncope     Past Surgical History:  Procedure Laterality Date  . CARDIAC ELECTROPHYSIOLOGY  MAPPING AND ABLATION  08/2010   [name] 09/07/2010 (12/14/2012)  . COLONOSCOPY     15-20 years ago had colon in [place]  . EXCISIONAL HEMORRHOIDECTOMY  1970's  . LOOP RECORDER IMPLANT N/A 08/23/2013   Procedure: LOOP RECORDER IMPLANT;  Surgeon: Deboraha Sprang, MD;  Location[facility] CATH LAB;  Service: Cardiovascular;  Laterality: N/A;  . MULTIPLE EXTRACTIONS WITH ALVEOLOPLASTY Bilateral 01/24/2016   Procedure: MULTIPLE EXTRACTION WITH ALVEOLOPLASTY BILATERAL;  Surgeon: Diona Browner, DDS;  Location: [facility];  Service: Oral Surgery;  Laterality: Bilateral;  . MULTIPLE TOOTH EXTRACTIONS  01/24/2016   MULTIPLE EXTRACTION WITH ALVEOLOPLASTY BILATERAL (Bilateral)    Family History  Problem Relation Age of Onset  . Diabetes Mother   . Hypertension Mother   . Cirrhosis Father   . Alcohol abuse Father   . Colon cancer Neg Hx   . Rectal cancer Neg Hx   . Stomach cancer Neg Hx     Social History   Socioeconomic History  . Marital status: Single    Spouse name: Not on file  . Number of children: Not on file  . Years of education: Not on file  . Highest education level: Not on file  Occupational History  . Occupation: Retired  Scientific laboratory technician  . Financial resource strain: Not on file  . Food insecurity    Worry: Not on file    Inability: Not on file  . Transportation needs    Medical: Not on file    Non-medical: Not on file  Tobacco Use  . Smoking status: Current Every Day Smoker    Packs/day: 0.10    Years: 50.00    Pack years: 5.00    Types: Cigarettes  . Smokeless tobacco: Never Used  . Tobacco comment: 2-3 cigs per day  Substance and Sexual Activity  . Alcohol use: Yes    Alcohol/week: 89.0 standard drinks    Types: 12 Cans of beer, 77 Shots of liquor per week    Comment: Sometimes.  . Drug use: Not Currently    Types: "Crack" cocaine, Cocaine, Heroin    Comment: 02/15/2017 "last used crack 4-5 years ago; last used heroin a couple years ago; currently denies any drug use-- on suboxone.   .  Sexual activity: Not Currently  Lifestyle  . Physical activity    Days per week: Not on file    Minutes per session: Not on file  . Stress: Not on file  Relationships  . Social Herbalist on phone: Not on file    Gets together: Not on file    Attends religious service: Not on file    Active member of club or organization: Not on file    Attends meetings of clubs or organizations: Not on file    Relationship status: Not on file  . Intimate partner violence    Fear of current or ex partner: Not on file    Emotionally abused: Not on file    Physically abused: Not on file    Forced sexual activity: Not on file  Other Topics Concern  . Not on file  Social History Narrative   Moved here from [place]. Lives with common law wife and grandchildren. He is retired from "general labor."    Review of systems: Review of Systems  Constitutional: Negative for fever and chills.  HENT: Negative.   Eyes: Negative for blurred vision.  Respiratory: as per HPI  Cardiovascular: Negative for chest pain and palpitations.  Gastrointestinal: Negative for vomiting, diarrhea, blood per rectum. Genitourinary: Negative for dysuria, urgency, frequency and hematuria.  Musculoskeletal: Negative for myalgias, back pain and joint pain.  Skin: Negative for itching and rash.  Neurological: Negative for dizziness, tremors, focal weakness, seizures and loss of consciousness.  Endo/Heme/Allergies: Negative for environmental allergies.  Psychiatric/Behavioral: Negative for depression, suicidal ideas and hallucinations.  All other systems reviewed and are negative.  Physical Exam: Blood pressure 120/78, pulse 72, temperature (!) 97.3 F (36.3 C), temperature source Temporal, height 5\' 6"  (1.676 m), weight 219 lb 3.2 oz (99.4 kg), SpO2 98 %. Gen:      No acute distress HEENT:  EOMI, sclera anicteric Neck:     No masses; no thyromegaly Lungs:    Clear to auscultation bilaterally; normal respiratory  effort CV:         Regular rate and rhythm; no murmurs Abd:      + bowel sounds; soft, non-tender; no palpable masses, no distension Ext:    1+ edema; adequate peripheral perfusion Skin:      Warm and dry; no rash Neuro: alert and oriented x 3 Psych: normal mood and affect  Data Reviewed: Imaging: CTA 06/05/2014- no pulmonary embolism.  Ascending thoracic aorta aneurysm.  Right thyroid nodule.  Mild bibasal atelectasis. Chest x-ray 02/09/2019-vague bilateral airspace opacities.  Vascular congestion, mild cardiomegaly. Chest x-ray 03/31/2019-pulmonary vascular congestion with patchy alveolar edema. Chest x-ray 04/02/2019- resolved right midlung atelectasis. I have reviewed the images personally.  PFTs: 05/30/2014- FVC 2.30 [59%], FEV1 1.35 [47%], F/F 59, TLC 4.57 [73%], DLCO 16.84 [62%] Severe obstruction with air trapping.  Moderate restriction and diffusion defect.  mMRC 05/02/2019-3  Assessment:  COPD Gold D Has had several recent admissions with respiratory failure requiring intubation recently.  This is likely combination of COPD, CHF in the setting of persistent atrial fibrillation, cardiomyopathy He has been recently started on Trelegy.  We will continue the same He did not desaturate on exertion Repeat PFTs Check baseline labs including CBC, IgE, alpha-1 antitrypsin levels and phenotype.  He is on amiodarone for atrial fibrillation.  There is no clear evidence of interstitial lung disease.  Will review CT chest when available  Active smoker Discussed smoking cessation with patient and encouraged strongly to quit.  Offered use of nicotine patch and Chantix but patient wants to quit on his own.  Reassess at return visit.  Time spent counseling-5 minutes We will refer him for low-dose screening CT of the chest  Suspected sleep apnea He has polycythemia with Jak 2 mutation.  Suspect he has undiagnosed  OSA/OHS which is also contributing to the problem as he has obesity and  symptoms of snoring Schedule sleep study.  Health maintenance 04/24/2019-influenza 05/24/2014-Prevnar 04/13/2018-Pneumovax  Plan/Recommendations: - Continue Trelegy - CBC, IgE, alpha-1 antitrypsin levels and phenotype - Smoking cessation - Low dose screening CT of the chest - Sleep study.  Marshell Garfinkel MD [facility] 05/02/2019, 9:09 AM  CC: Baldwin Jamaica, PA-C

## 2019-05-04 ENCOUNTER — Other Ambulatory Visit: Payer: Self-pay | Admitting: *Deleted

## 2019-05-04 DIAGNOSIS — Z122 Encounter for screening for malignant neoplasm of respiratory organs: Secondary | ICD-10-CM

## 2019-05-04 DIAGNOSIS — F17210 Nicotine dependence, cigarettes, uncomplicated: Secondary | ICD-10-CM

## 2019-05-04 DIAGNOSIS — Z87891 Personal history of nicotine dependence: Secondary | ICD-10-CM

## 2019-05-08 ENCOUNTER — Ambulatory Visit (INDEPENDENT_AMBULATORY_CARE_PROVIDER_SITE_OTHER): Payer: Medicare Other | Admitting: Pharmacist

## 2019-05-08 ENCOUNTER — Other Ambulatory Visit: Payer: Self-pay

## 2019-05-08 DIAGNOSIS — Z5181 Encounter for therapeutic drug level monitoring: Secondary | ICD-10-CM

## 2019-05-08 DIAGNOSIS — Z7901 Long term (current) use of anticoagulants: Secondary | ICD-10-CM | POA: Diagnosis present

## 2019-05-08 DIAGNOSIS — I4891 Unspecified atrial fibrillation: Secondary | ICD-10-CM

## 2019-05-08 LAB — POCT INR: INR: 2.9 (ref 2.0–3.0)

## 2019-05-08 NOTE — Patient Instructions (Signed)
Patient instructed to take medications as defined in the Anti-coagulation Track section of this encounter.  Patient instructed to take today's dose.  Patient instructed to take one (1) tablet of your 2.5mg  green-colored warfarin tablets by mouth, once-daily at [facility].  Patient verbalized understanding of these instructions.

## 2019-05-08 NOTE — Progress Notes (Signed)
Anticoagulation Management [name] is a 73 y.o. male who reports to the clinic for monitoring of warfarin treatment.    Indication: atrial fibrillation; long term current use of anticoagulant; AWAITING CARDIOVERSION.   Duration: indefinite Supervising physician: Joni Reining  Anticoagulation Clinic Visit History: Patient does not report signs/symptoms of bleeding or thromboembolism  Other recent changes: No diet, medications, lifestyle changes endorsed at this visit.  Anticoagulation Episode Summary    Current INR goal:  2.0-3.0  TTR:  67.3 % (6.3 y)  Next INR check:  05/15/2019  INR from last check:  2.9 (05/08/2019)  Weekly max warfarin dose:    Target end date:  Indefinite  INR check location:  Anticoagulation Clinic  Preferred lab:    Send INR reminders to:     Indications   Atrial fibrillation (HCC) [I48.91]       Comments:          No Known Allergies  Current Outpatient Medications:  .  albuterol (PROAIR HFA) 108 (90 Base) MCG/ACT inhaler, Inhale 1-2 puffs into the lungs every 6 (six) hours as needed for wheezing or shortness of breath., Disp: 18 g, Rfl: 3 .  albuterol (PROVENTIL) (2.5 MG/3ML) 0.083% nebulizer solution, Take 3 mLs (2.5 mg total) by nebulization 2 (two) times daily as needed for wheezing or shortness of breath., Disp: 75 mL, Rfl: 0 .  amiodarone (PACERONE) 200 MG tablet, TAKE 1/2 TABLET BY MOUTH EVERY DAY, Disp: 45 tablet, Rfl: 3 .  camphor-menthol (SARNA) lotion, Apply topically 3 (three) times daily., Disp: 222 mL, Rfl: 0 .  carvedilol (COREG) 3.125 MG tablet, Take 1 tablet in the morning and 2 tablets in the evening, Disp: 270 tablet, Rfl: 2 .  Fluticasone-Umeclidin-Vilant (TRELEGY ELLIPTA) 100-62.5-25 MCG/INH AEPB, Inhale 1 puff into the lungs daily., Disp: 60 each, Rfl: 5 .  folic acid (FOLVITE) 1 MG tablet, Take 1 tablet (1 mg total) by mouth daily., Disp: 90 tablet, Rfl: 3 .  hydroxyurea (HYDREA) 500 MG capsule, Take 2 capsules  (1,000 mg total) by mouth daily. MAY TAKE WITH FOOD TO MINIMIZE GI SIDE EFFECTS (Patient taking differently: Take 1,000 mg by mouth daily. ), Disp: 180 capsule, Rfl: 3 .  SUBOXONE 8-2 MG FILM, Place 1 Film under the tongue daily. , Disp: , Rfl:  .  warfarin (COUMADIN) 2.5 MG tablet, Take 1 tablet (2.5 mg total) by mouth daily at 6 PM., Disp: 30 tablet, Rfl: 2 .  furosemide (LASIX) 40 MG tablet, Take 1 tablet (40 mg total) by mouth 2 (two) times daily., Disp: 60 tablet, Rfl: 0 Past Medical History:  Diagnosis Date  . Acute lower GI bleeding 02/15/2017  . Atrial fibrillation ([place])   . Cardiomyopathy    EF55% 11/14<<35%   . CHF (congestive heart failure) ([place])   . COPD (chronic obstructive pulmonary disease) ([place])   . Essential hypertension   . GERD (gastroesophageal reflux disease)   . Gout   . Hepatitis    Possible history  . History of atrial flutter    Ablation 2012  . 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 Obesity   . Pneumonia X 1  . Primary polycythemia ([place]) 06/12/2013  . Renal insufficiency   . Substance abuse ([place])    History of alcohol; hx cocaine, heroin, crack use  . Syncope    Social History   Socioeconomic History  . Marital status: Single    Spouse name: Not on file  . Number of children: Not on file  . Years  of education: Not on file  . Highest education level: Not on file  Occupational History  . Occupation: Retired  Scientific laboratory technician  . Financial resource strain: Not on file  . Food insecurity    Worry: Not on file    Inability: Not on file  . Transportation needs    Medical: Not on file    Non-medical: Not on file  Tobacco Use  . Smoking status: Current Every Day Smoker    Packs/day: 0.10    Years: 50.00    Pack years: 5.00    Types: Cigarettes  . Smokeless tobacco: Never Used  . Tobacco comment: 2-3 cigs per day  Substance and Sexual Activity  . Alcohol use: Yes    Alcohol/week: 89.0 standard drinks    Types: 12 Cans of beer, 77 Shots of  liquor per week    Comment: Sometimes.  . Drug use: Not Currently    Types: "Crack" cocaine, Cocaine, Heroin    Comment: 02/15/2017 "last used crack 4-5 years ago; last used heroin a couple years ago; currently denies any drug use-- on suboxone.   . Sexual activity: Not Currently  Lifestyle  . Physical activity    Days per week: Not on file    Minutes per session: Not on file  . Stress: Not on file  Relationships  . Social Herbalist on phone: Not on file    Gets together: Not on file    Attends religious service: Not on file    Active member of club or organization: Not on file    Attends meetings of clubs or organizations: Not on file    Relationship status: Not on file  Other Topics Concern  . Not on file  Social History Narrative   Moved here from [place]. Lives with common law wife and grandchildren. He is retired from "general labor."   Family History  Problem Relation Age of Onset  . Diabetes Mother   . Hypertension Mother   . Cirrhosis Father   . Alcohol abuse Father   . Colon cancer Neg Hx   . Rectal cancer Neg Hx   . Stomach cancer Neg Hx     ASSESSMENT Recent Results: The most recent result is correlated with 17.5 mg per week: Lab Results  Component Value Date   INR 2.9 05/08/2019   INR 2.4 05/01/2019   INR 2.6 04/24/2019    Anticoagulation Dosing: Description   Take one (1) tablet of your 2.5mg  green-colored warfarin tablets every day of the week.       INR today: Therapeutic  PLAN Weekly dose was unchanged.   Patient Instructions  Patient instructed to take medications as defined in the Anti-coagulation Track section of this encounter.  Patient instructed to take today's dose.  Patient instructed to take one (1) tablet of your 2.5mg  green-colored warfarin tablets by mouth, once-daily at [facility].  Patient verbalized understanding of these instructions.    Patient advised to contact clinic or seek medical attention if signs/symptoms of  bleeding or thromboembolism occur.  Patient verbalized understanding by repeating back information and was advised to contact me if further medication-related questions arise. Patient was also provided an information handout.  Follow-up Return in 1 week (on 05/15/2019) for Follow up INR.  Pennie Banter, PharmD, CPP  15 minutes spent face-to-face with the patient during the encounter. 50% of time spent on education, including signs/sx bleeding and clotting, as well as food and drug interactions with warfarin. 50% of  time was spent on fingerprick POC INR sample collection,processing, results determination, and documentation in http://[email or web address].net/.

## 2019-05-09 LAB — IGE: IgE (Immunoglobulin E), Serum: 176 kU/L — ABNORMAL HIGH (ref <=114)

## 2019-05-09 LAB — ALPHA-1 ANTITRYPSIN PHENOTYPE: A-1 Antitrypsin, Ser: 148 mg/dL (ref 83–199)

## 2019-05-12 ENCOUNTER — Encounter: Payer: Self-pay | Admitting: Pulmonary Disease

## 2019-05-13 ENCOUNTER — Inpatient Hospital Stay (HOSPITAL_COMMUNITY): Payer: Medicare Other

## 2019-05-13 ENCOUNTER — Emergency Department (HOSPITAL_COMMUNITY): Payer: Medicare Other

## 2019-05-13 ENCOUNTER — Other Ambulatory Visit: Payer: Self-pay

## 2019-05-13 ENCOUNTER — Inpatient Hospital Stay (HOSPITAL_COMMUNITY)
Admission: EM | Admit: 2019-05-13 | Discharge: 2019-05-17 | DRG: 208 | Disposition: A | Discharge status: Home / Self Care | Payer: Medicare Other | Attending: Internal Medicine | Admitting: Internal Medicine

## 2019-05-13 ENCOUNTER — Encounter (HOSPITAL_COMMUNITY): Payer: Self-pay | Admitting: Radiology

## 2019-05-13 DIAGNOSIS — Z811 Family history of alcohol abuse and dependence: Secondary | ICD-10-CM

## 2019-05-13 DIAGNOSIS — T380X5A Adverse effect of glucocorticoids and synthetic analogues, initial encounter: Secondary | ICD-10-CM | POA: Diagnosis not present

## 2019-05-13 DIAGNOSIS — N179 Acute kidney failure, unspecified: Secondary | ICD-10-CM | POA: Diagnosis present

## 2019-05-13 DIAGNOSIS — D751 Secondary polycythemia: Secondary | ICD-10-CM | POA: Diagnosis present

## 2019-05-13 DIAGNOSIS — I4892 Unspecified atrial flutter: Secondary | ICD-10-CM | POA: Diagnosis present

## 2019-05-13 DIAGNOSIS — Z20828 Contact with and (suspected) exposure to other viral communicable diseases: Secondary | ICD-10-CM | POA: Diagnosis present

## 2019-05-13 DIAGNOSIS — Z6838 Body mass index (BMI) 38.0-38.9, adult: Secondary | ICD-10-CM | POA: Diagnosis not present

## 2019-05-13 DIAGNOSIS — K759 Inflammatory liver disease, unspecified: Secondary | ICD-10-CM | POA: Diagnosis present

## 2019-05-13 DIAGNOSIS — I719 Aortic aneurysm of unspecified site, without rupture: Secondary | ICD-10-CM | POA: Diagnosis present

## 2019-05-13 DIAGNOSIS — I959 Hypotension, unspecified: Secondary | ICD-10-CM | POA: Diagnosis present

## 2019-05-13 DIAGNOSIS — G8929 Other chronic pain: Secondary | ICD-10-CM | POA: Diagnosis present

## 2019-05-13 DIAGNOSIS — J189 Pneumonia, unspecified organism: Secondary | ICD-10-CM | POA: Diagnosis present

## 2019-05-13 DIAGNOSIS — F17210 Nicotine dependence, cigarettes, uncomplicated: Secondary | ICD-10-CM | POA: Diagnosis present

## 2019-05-13 DIAGNOSIS — Z833 Family history of diabetes mellitus: Secondary | ICD-10-CM | POA: Diagnosis not present

## 2019-05-13 DIAGNOSIS — Z9119 Patient's noncompliance with other medical treatment and regimen: Secondary | ICD-10-CM

## 2019-05-13 DIAGNOSIS — E669 Obesity, unspecified: Secondary | ICD-10-CM | POA: Diagnosis present

## 2019-05-13 DIAGNOSIS — R451 Restlessness and agitation: Secondary | ICD-10-CM | POA: Diagnosis present

## 2019-05-13 DIAGNOSIS — R739 Hyperglycemia, unspecified: Secondary | ICD-10-CM | POA: Diagnosis not present

## 2019-05-13 DIAGNOSIS — J441 Chronic obstructive pulmonary disease with (acute) exacerbation: Secondary | ICD-10-CM | POA: Diagnosis present

## 2019-05-13 DIAGNOSIS — M109 Gout, unspecified: Secondary | ICD-10-CM | POA: Diagnosis present

## 2019-05-13 DIAGNOSIS — Z8249 Family history of ischemic heart disease and other diseases of the circulatory system: Secondary | ICD-10-CM | POA: Diagnosis not present

## 2019-05-13 DIAGNOSIS — C946 Myelodysplastic disease, not classified: Secondary | ICD-10-CM | POA: Diagnosis present

## 2019-05-13 DIAGNOSIS — E872 Acidosis: Secondary | ICD-10-CM | POA: Diagnosis present

## 2019-05-13 DIAGNOSIS — I429 Cardiomyopathy, unspecified: Secondary | ICD-10-CM | POA: Diagnosis present

## 2019-05-13 DIAGNOSIS — I4819 Other persistent atrial fibrillation: Secondary | ICD-10-CM | POA: Diagnosis present

## 2019-05-13 DIAGNOSIS — J440 Chronic obstructive pulmonary disease with acute lower respiratory infection: Secondary | ICD-10-CM | POA: Diagnosis present

## 2019-05-13 DIAGNOSIS — I130 Hypertensive heart and chronic kidney disease with heart failure and stage 1 through stage 4 chronic kidney disease, or unspecified chronic kidney disease: Secondary | ICD-10-CM | POA: Diagnosis present

## 2019-05-13 DIAGNOSIS — I5043 Acute on chronic combined systolic (congestive) and diastolic (congestive) heart failure: Secondary | ICD-10-CM | POA: Diagnosis present

## 2019-05-13 DIAGNOSIS — R410 Disorientation, unspecified: Secondary | ICD-10-CM | POA: Diagnosis present

## 2019-05-13 DIAGNOSIS — N181 Chronic kidney disease, stage 1: Secondary | ICD-10-CM | POA: Diagnosis present

## 2019-05-13 DIAGNOSIS — K219 Gastro-esophageal reflux disease without esophagitis: Secondary | ICD-10-CM | POA: Diagnosis present

## 2019-05-13 DIAGNOSIS — J9601 Acute respiratory failure with hypoxia: Secondary | ICD-10-CM | POA: Diagnosis present

## 2019-05-13 DIAGNOSIS — Z978 Presence of other specified devices: Secondary | ICD-10-CM

## 2019-05-13 DIAGNOSIS — Z9981 Dependence on supplemental oxygen: Secondary | ICD-10-CM

## 2019-05-13 LAB — POCT I-STAT 7, (LYTES, BLD GAS, ICA,H+H)
Acid-base deficit: 1 mmol/L (ref 0.0–2.0)
Bicarbonate: 25.7 mmol/L (ref 20.0–28.0)
Calcium, Ion: 1.14 mmol/L — ABNORMAL LOW (ref 1.15–1.40)
HCT: 41 % (ref 39.0–52.0)
Hemoglobin: 13.9 g/dL (ref 13.0–17.0)
O2 Saturation: 100 %
Potassium: 4.1 mmol/L (ref 3.5–5.1)
Sodium: 142 mmol/L (ref 135–145)
TCO2: 27 mmol/L (ref 22–32)
pCO2 arterial: 48 mmHg (ref 32.0–48.0)
pH, Arterial: 7.337 — ABNORMAL LOW (ref 7.350–7.450)
pO2, Arterial: 278 mmHg — ABNORMAL HIGH (ref 83.0–108.0)

## 2019-05-13 LAB — COMPREHENSIVE METABOLIC PANEL
ALT: 32 U/L (ref 0–44)
ALT: 34 U/L (ref 0–44)
AST: 80 U/L — ABNORMAL HIGH (ref 15–41)
AST: 50 U/L — ABNORMAL HIGH (ref 15–41)
Albumin: 3.4 g/dL — ABNORMAL LOW (ref 3.5–5.0)
Albumin: 3 g/dL — ABNORMAL LOW (ref 3.5–5.0)
Alkaline Phosphatase: 150 U/L — ABNORMAL HIGH (ref 38–126)
Alkaline Phosphatase: 122 U/L (ref 38–126)
Anion gap: 17 — ABNORMAL HIGH (ref 5–15)
Anion gap: 11 (ref 5–15)
BUN: 20 mg/dL (ref 8–23)
BUN: 23 mg/dL (ref 8–23)
CO2: 19 mmol/L — ABNORMAL LOW (ref 22–32)
CO2: 21 mmol/L — ABNORMAL LOW (ref 22–32)
Calcium: 8.8 mg/dL — ABNORMAL LOW (ref 8.9–10.3)
Calcium: 8.2 mg/dL — ABNORMAL LOW (ref 8.9–10.3)
Chloride: 107 mmol/L (ref 98–111)
Chloride: 110 mmol/L (ref 98–111)
Creatinine, Ser: 1.62 mg/dL — ABNORMAL HIGH (ref 0.61–1.24)
Creatinine, Ser: 1.4 mg/dL — ABNORMAL HIGH (ref 0.61–1.24)
GFR calc Af Amer: 49 mL/min — ABNORMAL LOW (ref >60)
GFR calc Af Amer: 58 mL/min — ABNORMAL LOW (ref >60)
GFR calc non Af Amer: 42 mL/min — ABNORMAL LOW (ref >60)
GFR calc non Af Amer: 50 mL/min — ABNORMAL LOW (ref >60)
Glucose, Bld: 262 mg/dL — ABNORMAL HIGH (ref 70–99)
Glucose, Bld: 138 mg/dL — ABNORMAL HIGH (ref 70–99)
Potassium: 4.2 mmol/L (ref 3.5–5.1)
Potassium: 4.4 mmol/L (ref 3.5–5.1)
Sodium: 143 mmol/L (ref 135–145)
Sodium: 142 mmol/L (ref 135–145)
Total Bilirubin: 0.5 mg/dL (ref 0.3–1.2)
Total Bilirubin: 0.8 mg/dL (ref 0.3–1.2)
Total Protein: 6.9 g/dL (ref 6.5–8.1)
Total Protein: 6 g/dL — ABNORMAL LOW (ref 6.5–8.1)

## 2019-05-13 LAB — CBC WITH DIFFERENTIAL/PLATELET
Abs Immature Granulocytes: 0.02 10*3/uL (ref 0.00–0.07)
Basophils Absolute: 0 10*3/uL (ref 0.0–0.1)
Basophils Relative: 0 %
Eosinophils Absolute: 0 10*3/uL (ref 0.0–0.5)
Eosinophils Relative: 0 %
HCT: 41.1 % (ref 39.0–52.0)
Hemoglobin: 14.6 g/dL (ref 13.0–17.0)
Immature Granulocytes: 0 %
Lymphocytes Relative: 10 %
Lymphs Abs: 0.4 10*3/uL — ABNORMAL LOW (ref 0.7–4.0)
MCH: 43.1 pg — ABNORMAL HIGH (ref 26.0–34.0)
MCHC: 35.5 g/dL (ref 30.0–36.0)
MCV: 121.2 fL — ABNORMAL HIGH (ref 80.0–100.0)
Monocytes Absolute: 0.1 10*3/uL (ref 0.1–1.0)
Monocytes Relative: 2 %
Neutro Abs: 4 10*3/uL (ref 1.7–7.7)
Neutrophils Relative %: 88 %
Platelets: 142 10*3/uL — ABNORMAL LOW (ref 150–400)
RBC: 3.39 MIL/uL — ABNORMAL LOW (ref 4.22–5.81)
RDW: 14 % (ref 11.5–15.5)
WBC: 4.6 10*3/uL (ref 4.0–10.5)
nRBC: 0 % (ref 0.0–0.2)

## 2019-05-13 LAB — AMYLASE: Amylase: 132 U/L — ABNORMAL HIGH (ref 28–100)

## 2019-05-13 LAB — RAPID URINE DRUG SCREEN, HOSP PERFORMED
Amphetamines: NOT DETECTED
Barbiturates: NOT DETECTED
Benzodiazepines: NOT DETECTED
Cocaine: NOT DETECTED
Opiates: NOT DETECTED
Tetrahydrocannabinol: NOT DETECTED

## 2019-05-13 LAB — LIPASE, BLOOD: Lipase: 23 U/L (ref 11–51)

## 2019-05-13 LAB — URINALYSIS, COMPLETE (UACMP) WITH MICROSCOPIC
Bilirubin Urine: NEGATIVE
Glucose, UA: NEGATIVE mg/dL
Ketones, ur: NEGATIVE mg/dL
Leukocytes,Ua: NEGATIVE
Nitrite: NEGATIVE
Protein, ur: 100 mg/dL — AB
Specific Gravity, Urine: 1.02 (ref 1.005–1.030)
pH: 5 (ref 5.0–8.0)

## 2019-05-13 LAB — MAGNESIUM: Magnesium: 2.2 mg/dL (ref 1.7–2.4)

## 2019-05-13 LAB — TROPONIN I (HIGH SENSITIVITY)
Troponin I (High Sensitivity): 13 ng/L (ref <18)
Troponin I (High Sensitivity): 119 ng/L (ref <18)
Troponin I (High Sensitivity): 149 ng/L (ref <18)

## 2019-05-13 LAB — GLUCOSE, CAPILLARY
Glucose-Capillary: 117 mg/dL — ABNORMAL HIGH (ref 70–99)
Glucose-Capillary: 128 mg/dL — ABNORMAL HIGH (ref 70–99)
Glucose-Capillary: 129 mg/dL — ABNORMAL HIGH (ref 70–99)

## 2019-05-13 LAB — MRSA PCR SCREENING: MRSA by PCR: NEGATIVE

## 2019-05-13 LAB — LACTIC ACID, PLASMA
Lactic Acid, Venous: 3.9 mmol/L (ref 0.5–1.9)
Lactic Acid, Venous: 2.6 mmol/L (ref 0.5–1.9)
Lactic Acid, Venous: 2.2 mmol/L (ref 0.5–1.9)
Lactic Acid, Venous: 2.7 mmol/L (ref 0.5–1.9)

## 2019-05-13 LAB — CBC
HCT: 47.8 % (ref 39.0–52.0)
Hemoglobin: 16.4 g/dL (ref 13.0–17.0)
MCH: 43.4 pg — ABNORMAL HIGH (ref 26.0–34.0)
MCHC: 34.3 g/dL (ref 30.0–36.0)
MCV: 126.5 fL — ABNORMAL HIGH (ref 80.0–100.0)
Platelets: 195 10*3/uL (ref 150–400)
RBC: 3.78 MIL/uL — ABNORMAL LOW (ref 4.22–5.81)
RDW: 14.1 % (ref 11.5–15.5)
WBC: 10.4 10*3/uL (ref 4.0–10.5)
nRBC: 0 % (ref 0.0–0.2)

## 2019-05-13 LAB — PROCALCITONIN: Procalcitonin: 0.63 ng/mL

## 2019-05-13 LAB — PROTIME-INR
INR: 2.3 — ABNORMAL HIGH (ref 0.8–1.2)
Prothrombin Time: 24.7 seconds — ABNORMAL HIGH (ref 11.4–15.2)

## 2019-05-13 LAB — BRAIN NATRIURETIC PEPTIDE: B Natriuretic Peptide: 447.6 pg/mL — ABNORMAL HIGH (ref 0.0–100.0)

## 2019-05-13 LAB — INFLUENZA PANEL BY PCR (TYPE A & B)
Influenza A By PCR: NEGATIVE
Influenza B By PCR: NEGATIVE

## 2019-05-13 LAB — STREP PNEUMONIAE URINARY ANTIGEN: Strep Pneumo Urinary Antigen: NEGATIVE

## 2019-05-13 LAB — SARS CORONAVIRUS 2 BY RT PCR (HOSPITAL ORDER, PERFORMED IN ~~LOC~~ HOSPITAL LAB): SARS Coronavirus 2: NEGATIVE

## 2019-05-13 MED ORDER — SODIUM CHLORIDE 0.9 % IV BOLUS
500.0000 mL | Freq: Once | INTRAVENOUS | Status: AC
  Administered 2019-05-13: 500 mL via INTRAVENOUS

## 2019-05-13 MED ORDER — FENTANYL CITRATE (PF) 100 MCG/2ML IJ SOLN
25.0000 ug | INTRAMUSCULAR | Status: DC | PRN
  Administered 2019-05-13 – 2019-05-14 (×5): 100 ug via INTRAVENOUS
  Administered 2019-05-14: 50 ug via INTRAVENOUS
  Administered 2019-05-14 (×2): 100 ug via INTRAVENOUS
  Filled 2019-05-13 (×3): qty 2

## 2019-05-13 MED ORDER — ENOXAPARIN SODIUM 40 MG/0.4ML ~~LOC~~ SOLN: 40.0000 mg | SUBCUTANEOUS | Status: DC

## 2019-05-13 MED ORDER — MIDAZOLAM HCL 2 MG/2ML IJ SOLN
1.0000 mg | INTRAMUSCULAR | Status: DC | PRN
  Administered 2019-05-13 – 2019-05-14 (×2): 1 mg via INTRAVENOUS

## 2019-05-13 MED ORDER — FENTANYL CITRATE (PF) 100 MCG/2ML IJ SOLN: 25.0000 ug | INTRAMUSCULAR | Status: DC | PRN

## 2019-05-13 MED ORDER — INSULIN ASPART 100 UNIT/ML ~~LOC~~ SOLN
3.0000 [IU] | SUBCUTANEOUS | Status: DC
  Administered 2019-05-13 – 2019-05-14 (×4): 3 [IU] via SUBCUTANEOUS
  Administered 2019-05-14: 9 [IU] via SUBCUTANEOUS
  Administered 2019-05-14: 3 [IU] via SUBCUTANEOUS
  Administered 2019-05-15 (×2): 6 [IU] via SUBCUTANEOUS

## 2019-05-13 MED ORDER — IOHEXOL 350 MG/ML SOLN
80.0000 mL | Freq: Once | INTRAVENOUS | Status: AC | PRN
  Administered 2019-05-13: 80 mL via INTRAVENOUS

## 2019-05-13 MED ORDER — NOREPINEPHRINE 4 MG/250ML-% IV SOLN
0.0000 ug/min | INTRAVENOUS | Status: DC
  Filled 2019-05-13: qty 250

## 2019-05-13 MED ORDER — PROPOFOL 1000 MG/100ML IV EMUL
0.0000 ug/kg/min | INTRAVENOUS | Status: DC
  Administered 2019-05-13: 20 ug/kg/min via INTRAVENOUS
  Administered 2019-05-14: 30 ug/kg/min via INTRAVENOUS
  Filled 2019-05-13 (×2): qty 100

## 2019-05-13 MED ORDER — ORAL CARE MOUTH RINSE
15.0000 mL | OROMUCOSAL | Status: DC
  Administered 2019-05-13 – 2019-05-14 (×6): 15 mL via OROMUCOSAL

## 2019-05-13 MED ORDER — SODIUM CHLORIDE 0.9% FLUSH: 3.0000 mL | Freq: Once | INTRAVENOUS | Status: DC

## 2019-05-13 MED ORDER — FENTANYL CITRATE (PF) 100 MCG/2ML IJ SOLN
25.0000 ug | INTRAMUSCULAR | Status: DC | PRN
  Administered 2019-05-14: 25 ug via INTRAVENOUS
  Filled 2019-05-13: qty 2

## 2019-05-13 MED ORDER — VANCOMYCIN HCL 10 G IV SOLR: 1750.0000 mg | INTRAVENOUS | Status: DC

## 2019-05-13 MED ORDER — SODIUM CHLORIDE 0.9 % IV SOLN
2.0000 g | Freq: Two times a day (BID) | INTRAVENOUS | Status: DC
  Administered 2019-05-13 – 2019-05-15 (×4): 2 g via INTRAVENOUS
  Filled 2019-05-13 (×4): qty 2

## 2019-05-13 MED ORDER — MIDAZOLAM HCL 2 MG/2ML IJ SOLN
1.0000 mg | INTRAMUSCULAR | Status: DC | PRN
  Administered 2019-05-13 – 2019-05-14 (×2): 1 mg via INTRAVENOUS
  Filled 2019-05-13 (×4): qty 2

## 2019-05-13 MED ORDER — SODIUM CHLORIDE 0.9 % IV BOLUS
1000.0000 mL | Freq: Once | INTRAVENOUS | Status: AC
  Administered 2019-05-13: 1000 mL via INTRAVENOUS

## 2019-05-13 MED ORDER — CHLORHEXIDINE GLUCONATE CLOTH 2 % EX PADS
6.0000 | MEDICATED_PAD | Freq: Every day | CUTANEOUS | Status: DC
  Administered 2019-05-13 – 2019-05-15 (×3): 6 via TOPICAL

## 2019-05-13 MED ORDER — EPINEPHRINE 0.3 MG/0.3ML IJ SOAJ
0.3000 mg | Freq: Once | INTRAMUSCULAR | Status: AC
  Administered 2019-05-13: 0.3 mg via INTRAMUSCULAR

## 2019-05-13 MED ORDER — KETAMINE HCL 10 MG/ML IJ SOLN
INTRAMUSCULAR | Status: AC | PRN
  Administered 2019-05-13: 100 mg via INTRAVENOUS

## 2019-05-13 MED ORDER — CHLORHEXIDINE GLUCONATE 0.12% ORAL RINSE (MEDLINE KIT)
15.0000 mL | Freq: Two times a day (BID) | OROMUCOSAL | Status: DC
  Administered 2019-05-13 – 2019-05-14 (×2): 15 mL via OROMUCOSAL

## 2019-05-13 MED ORDER — ROCURONIUM BROMIDE 50 MG/5ML IV SOLN
INTRAVENOUS | Status: AC | PRN
  Administered 2019-05-13: 100 mg via INTRAVENOUS

## 2019-05-13 MED ORDER — VANCOMYCIN HCL 10 G IV SOLR
2000.0000 mg | Freq: Once | INTRAVENOUS | Status: AC
  Administered 2019-05-13: 2000 mg via INTRAVENOUS
  Filled 2019-05-13: qty 2000

## 2019-05-13 MED ORDER — KETAMINE HCL 50 MG/5ML IJ SOSY
PREFILLED_SYRINGE | INTRAMUSCULAR | Status: AC
  Filled 2019-05-13: qty 5

## 2019-05-13 MED ORDER — DEXTROSE-NACL 5-0.45 % IV SOLN
INTRAVENOUS | Status: DC
  Administered 2019-05-13: 16:00:00 via INTRAVENOUS

## 2019-05-13 NOTE — Progress Notes (Signed)
ANTICOAGULATION CONSULT NOTE - Initial Consult  Pharmacy Consult for heparin  Indication: atrial fibrillation  No Known Allergies  Patient Measurements: Height: 5\' 6"  (167.6 cm) Weight: 219 lb 3.2 oz (99.4 kg) IBW/kg (Calculated) : 63.8 Heparin Dosing Weight: 85kg  Vital Signs: Temp: 97.8 F (36.6 C) (10/24 1627) Temp Source: Oral (10/24 1627) BP: 94/55 (10/24 1800) Pulse Rate: 59 (10/24 1800)  Labs: Recent Labs    05/13/19 0952 05/13/19 1137 05/13/19 1303 05/13/19 1714 05/13/19 1811  HGB 16.4 13.9  --   --  14.6  HCT 47.8 41.0  --   --  41.1  PLT 195  --   --   --  142*  LABPROT  --   --   --   --  24.7*  INR  --   --   --   --  2.3*  CREATININE 1.62*  --   --   --   --   TROPONINIHS 13  --  119* 149*  --     Estimated Creatinine Clearance: 46.1 mL/min (A) (by C-G formula based on SCr of 1.62 mg/dL (H)).   Medical History: Past Medical History:  Diagnosis Date  . Acute lower GI bleeding 02/15/2017  . Atrial fibrillation ([place])   . Cardiomyopathy    EF55% 11/14<<35%   . CHF (congestive heart failure) ([place])   . COPD (chronic obstructive pulmonary disease) ([place])   . Essential hypertension   . GERD (gastroesophageal reflux disease)   . Gout   . Hepatitis    Possible history  . History of atrial flutter    Ablation 2012  . 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 Obesity   . Pneumonia X 1  . Primary polycythemia ([place]) 06/12/2013  . Renal insufficiency   . Substance abuse ([place])    History of alcohol; hx cocaine, heroin, crack use  . Syncope     Medications:  Medications Prior to Admission  Medication Sig Dispense Refill Last Dose  . albuterol (PROAIR HFA) 108 (90 Base) MCG/ACT inhaler Inhale 1-2 puffs into the lungs every 6 (six) hours as needed for wheezing or shortness of breath. 18 g 3 Past Month at Unknown time  . albuterol (PROVENTIL) (2.5 MG/3ML) 0.083% nebulizer solution Take 3 mLs (2.5 mg total) by nebulization 2 (two) times daily as  needed for wheezing or shortness of breath. 75 mL 0 Past Month at Unknown time  . amiodarone (PACERONE) 200 MG tablet TAKE 1/2 TABLET BY MOUTH EVERY DAY (Patient taking differently: Take 100 mg by mouth daily. ) 45 tablet 3 unk at LF 04/18/19 90DS  . camphor-menthol (SARNA) lotion Apply topically 3 (three) times daily. (Patient taking differently: Apply 1 application topically daily as needed for itching. ) 222 mL 0 unk at prn  . carvedilol (COREG) 3.125 MG tablet Take 1 tablet in the morning and 2 tablets in the evening (Patient taking differently: Take 3.125-6.25 mg by mouth See admin instructions. Take 1 table (3.125 mg totally) by mouth t in the morning and 2 tablets (6.25 mg totally) by mouth in the evening) 270 tablet 2 unk at LF 04/18/19 90DS  . Fluticasone-Umeclidin-Vilant (TRELEGY ELLIPTA) 100-62.5-25 MCG/INH AEPB Inhale 1 puff into the lungs daily. 60 each 5 unk at LF 02/02/19 30DS  . folic acid (FOLVITE) 1 MG tablet Take 1 tablet (1 mg total) by mouth daily. 90 tablet 3 unk at LF 03/07/19 90DS  . furosemide (LASIX) 40 MG tablet Take 1 tablet (40 mg total) by mouth 2 (  two) times daily. 60 tablet 0 unk at LF 04/19/19 30DS  . hydroxyurea (HYDREA) 500 MG capsule Take 2 capsules (1,000 mg total) by mouth daily. MAY TAKE WITH FOOD TO MINIMIZE GI SIDE EFFECTS (Patient taking differently: Take 1,000 mg by mouth daily. ) 180 capsule 3 unk at LF 04/18/19 90DS  . SUBOXONE 8-2 MG FILM Place 1 Film under the tongue daily.    unk at [facility] 05/11/19 14DS  . warfarin (COUMADIN) 2.5 MG tablet Take 1 tablet (2.5 mg total) by mouth daily at 6 PM. 30 tablet 2 unk at [facility] 05/12/19 90DS    Assessment: 73 yo male with VDRF on warfarin PTA for afib. Pharmacy consulted to dose heparin and to begin based on INR. -INR= 2.3  Goal of Therapy:  Heparin level 0.3-0.7 units/ml Monitor platelets by anticoagulation protocol: Yes   Plan:  -Will start heparin when INR < 2.0 -Daily PT/INR  Hildred Laser, PharmD Clinical  Pharmacist **Pharmacist phone directory can now be found on [email or web address] ([email or web address]).  Listed under [place].

## 2019-05-13 NOTE — Progress Notes (Signed)
[facility] Progress Note Patient Name: Kellen Scheidegger DOB: February 04, 1946 MRN: [id number]   Date of Service  05/13/2019  HPI/Events of Note  Lactic Acid = 2.2 --> 2.7. Patient has LVEF = 35% to 40%. Already getting ordered fluid bolus and Norepinephrine IV infusion ordered, but not started. BP = 114/61.   eICU Interventions  Will continue to monitor Lactic Acid level.      Intervention Category Major Interventions: Acid-Base disturbance - evaluation and management  Sommer,Steven Eugene 05/13/2019, 10:34 PM

## 2019-05-13 NOTE — ED Notes (Signed)
Dr Chase Caller aware of elevated troponin.

## 2019-05-13 NOTE — ED Notes (Signed)
Spoke multiple times with wife.  She does not want to come see him until he is extubated because it is too hard for her to see him intubated.  [phone]

## 2019-05-13 NOTE — Progress Notes (Signed)
Pt transported to 3M09 via ventilator from the ED. No complications noted. Notified Kal RRT of new patient.

## 2019-05-13 NOTE — ED Triage Notes (Addendum)
Pt here from home via [facility] for  resp distress.  Initial sats were 41% per fire.  Increased to 80% on nrb.  Placed on C-pap and given duo nebx, 125 solumedrol and 2 g mag.

## 2019-05-13 NOTE — Progress Notes (Signed)
[facility] Progress Note Patient Name: [name] DOB: [birth year] MRN: [id number]   Date of Service  05/13/2019  HPI/Events of Note  Hypotension - BP = 89/45 with MAP = 60. LVEF = 35-40%. Can't use Phenylephrine IV infusion d/t prolonged QTc.  eICU Interventions  Will order: 1. Bolus with 0.9 NaCl 500 mL IV over 30 minutes now.  2. Norepinephrine IV infusion. Titrate to MAP >= 65.     Intervention Category Major Interventions: Hypotension - evaluation and management  Sommer,Steven Eugene 05/13/2019, 9:27 PM

## 2019-05-13 NOTE — Progress Notes (Signed)
Found ETT to be at 28 cm at the lip when patient arrived at the unit, retracted it back to where it was in the ED, verified with ED RT before I retracted the ETT, RN claims that there is an order to pull ETT 3cm, but the order is under nursing and not respiratory order.

## 2019-05-13 NOTE — Progress Notes (Signed)
[facility] Progress Note Patient Name: [name] DOB: [birth year] MRN: [id number]   Date of Service  05/13/2019  HPI/Events of Note  Severe agitation - Patient kicking nursing staff. Request for bilateral soft wrist and ankle restraints.  eICU Interventions  Will order: 1. Bilateral soft wrist and ankle restraints.      Intervention Category Major Interventions: Delirium, psychosis, severe agitation - evaluation and management  [name] 05/13/2019, 10:53 PM

## 2019-05-13 NOTE — Progress Notes (Signed)
Pharmacy Antibiotic Note  [name] is a 73 y.o. male admitted on 05/13/2019 with COPD/respiratory failure and on the ventilator. Pharmacy has been consulted to dose vancomycin and cefepime -WBC= 10.4, afeb, SCr= 1.62 (recently 1.3-2.1)  Plan:  -Cefepime 2gm IV q12h -Vancomycin 2000mg  IV followed by 1750mg  IV q48 (estimated AUC= 498) -Per consult will d/c vancomycin if MRSA PCR is negative -Will follow renal function, cultures and clinical progress    Height: 5\' 6"  (167.6 cm) Weight: 219 lb 3.2 oz (99.4 kg) IBW/kg (Calculated) : 63.8  Temp (24hrs), Avg:96.8 F (36 C), Min:95.6 F (35.3 C), Max:97.8 F (36.6 C)  Recent Labs  Lab 05/13/19 0952 05/13/19 1303  WBC 10.4  --   CREATININE 1.62*  --   LATICACIDVEN 3.9* 2.6*    Estimated Creatinine Clearance: 46.1 mL/min (A) (by C-G formula based on SCr of 1.62 mg/dL (H)).    No Known Allergies  Antimicrobials this admission: 10/24 vanc 10/24 cefepime  Dose adjustments this admission:   Microbiology results: 10/24 urine 10/24 blood x3 10/24 MRSA PCR  Thank you for allowing pharmacy to be a part of this patient's care.  Hildred Laser, PharmD Clinical Pharmacist **Pharmacist phone directory can now be found on [email or web address] ([email or web address]).  Listed under [place].

## 2019-05-13 NOTE — Progress Notes (Signed)
Pharmacy Antibiotic Note  [name] is a 73 y.o. male admitted on 05/13/2019 with COPD/respiratory failure and on the ventilator. Pharmacy has been consulted to dose vancomycin and cefepime -WBC= 10.4, afeb, SCr= 1.62 (recently 1.3-2.1) -MRSA PCR negative  Plan:  -Continue Cefepime 2gm IV q12h --Per consult will d/c vancomycin (MRSA PCR is negative) -Will follow renal function, cultures and clinical progress    Height: 5\' 6"  (167.6 cm) Weight: 219 lb 3.2 oz (99.4 kg) IBW/kg (Calculated) : 63.8  Temp (24hrs), Avg:96.8 F (36 C), Min:95.6 F (35.3 C), Max:97.8 F (36.6 C)  Recent Labs  Lab 05/13/19 0952 05/13/19 1303 05/13/19 1811  WBC 10.4  --  4.6  CREATININE 1.62*  --   --   LATICACIDVEN 3.9* 2.6* 2.2*    Estimated Creatinine Clearance: 46.1 mL/min (A) (by C-G formula based on SCr of 1.62 mg/dL (H)).    No Known Allergies  Antimicrobials this admission: 10/24 vanc>> 10/24 10/24 cefepime  Dose adjustments this admission:   Microbiology results: 10/24 urine 10/24 blood x3 10/24 MRSA PCR-neg  Thank you for allowing pharmacy to be a part of this patient's care.  Hildred Laser, PharmD Clinical Pharmacist **Pharmacist phone directory can now be found on [email or web address] ([email or web address]).  Listed under [place].

## 2019-05-13 NOTE — Plan of Care (Signed)
  Problem: Clinical Measurements: Goal: Respiratory complications will improve Outcome: Progressing  Problem: Clinical Measurements: Goal: Diagnostic test results will improve Outcome: Not Progressing  Pts lactic acid still trending up during this shift. Additional fluids ordered. Will continue to monitor. Problem: Coping: Goal: Level of anxiety will decrease Outcome: Not Progressing Pt anxious on the ventilator, coughing and biting the ETT during the shift. RN offered support and encouragement and medication for anxiety. Problem: Safety: Goal: Ability to remain free from injury will improve Outcome: Not Progressing   Pt became combative during the shift requiring additional sedation and wrist restraints

## 2019-05-13 NOTE — ED Notes (Signed)
Attempted report 

## 2019-05-13 NOTE — Progress Notes (Signed)
Pt was calm and resting when phlebotomy came into pts room to draw labs. As phlebotomist was assisting RN to put mitten back on pts hand pt became combative attempting to hit and kick RN and other staff. Miguel Barrera called and notified. Orders received for restraints. Will continue to monitor closely. Clint Bolder, RN 05/13/19 10:57 PM

## 2019-05-13 NOTE — Progress Notes (Signed)
CRITICAL VALUE ALERT  Critical Value:  Lactic acid 2.7  Date & Time Notified:  05/13/19 10:28 PM   Provider Notified: Warren Lacy

## 2019-05-13 NOTE — ED Provider Notes (Signed)
[facility] Emergency Department Provider Note MRN:  [id number]  Arrival date & time: 05/13/19     Chief Complaint   Respiratory Distress   History of Present Illness   [name] is a 73 y.o. year-old male with a history of CHF, COPD presenting to the ED with chief complaint of respiratory distress.  Gradual onset shortness of breath that became much worse this morning.  EMS providing BiPAP, duo nebs, magnesium.  Review of Systems  Positive for respiratory failure.  Patient's Health History    Past Medical History:  Diagnosis Date  . Acute lower GI bleeding 02/15/2017  . Atrial fibrillation ([place])   . Cardiomyopathy    EF55% 11/14<<35%   . CHF (congestive heart failure) ([place])   . COPD (chronic obstructive pulmonary disease) ([place])   . Essential hypertension   . GERD (gastroesophageal reflux disease)   . Gout   . Hepatitis    Possible history  . History of atrial flutter    Ablation 2012  . 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 Obesity   . Pneumonia X 1  . Primary polycythemia ([place]) 06/12/2013  . Renal insufficiency   . Substance abuse ([place])    History of alcohol; hx cocaine, heroin, crack use  . Syncope     Past Surgical History:  Procedure Laterality Date  . CARDIAC ELECTROPHYSIOLOGY MAPPING AND ABLATION  08/2010   [name] 09/07/2010 (12/14/2012)  . COLONOSCOPY     15-20 years ago had colon in [place]  . EXCISIONAL HEMORRHOIDECTOMY  1970's  . LOOP RECORDER IMPLANT N/A 08/23/2013   Procedure: LOOP RECORDER IMPLANT;  Surgeon: Deboraha Sprang, MD;  Location: [facility] CATH LAB;  Service: Cardiovascular;  Laterality: N/A;  . MULTIPLE EXTRACTIONS WITH ALVEOLOPLASTY Bilateral 01/24/2016   Procedure: MULTIPLE EXTRACTION WITH ALVEOLOPLASTY BILATERAL;  Surgeon: Diona Browner, DDS;  Location: [place];  Service: Oral Surgery;  Laterality: Bilateral;  . MULTIPLE TOOTH EXTRACTIONS  01/24/2016   MULTIPLE EXTRACTION WITH ALVEOLOPLASTY BILATERAL  (Bilateral)    Family History  Problem Relation Age of Onset  . Diabetes Mother   . Hypertension Mother   . Cirrhosis Father   . Alcohol abuse Father   . Colon cancer Neg Hx   . Rectal cancer Neg Hx   . Stomach cancer Neg Hx     Social History   Socioeconomic History  . Marital status: Single    Spouse name: Not on file  . Number of children: Not on file  . Years of education: Not on file  . Highest education level: Not on file  Occupational History  . Occupation: Retired  Scientific laboratory technician  . Financial resource strain: Not on file  . Food insecurity    Worry: Not on file    Inability: Not on file  . Transportation needs    Medical: Not on file    Non-medical: Not on file  Tobacco Use  . Smoking status: Current Every Day Smoker    Packs/day: 0.10    Years: 50.00    Pack years: 5.00    Types: Cigarettes  . Smokeless tobacco: Never Used  . Tobacco comment: 2-3 cigs per day  Substance and Sexual Activity  . Alcohol use: Yes    Alcohol/week: 89.0 standard drinks    Types: 12 Cans of beer, 77 Shots of liquor per week    Comment: Sometimes.  . Drug use: Not Currently    Types: "Crack" cocaine, Cocaine, Heroin    Comment: 02/15/2017 "last used crack  4-5 years ago; last used heroin a couple years ago; currently denies any drug use-- on suboxone.   . Sexual activity: Not Currently  Lifestyle  . Physical activity    Days per week: Not on file    Minutes per session: Not on file  . Stress: Not on file  Relationships  . Social Herbalist on phone: Not on file    Gets together: Not on file    Attends religious service: Not on file    Active member of club or organization: Not on file    Attends meetings of clubs or organizations: Not on file    Relationship status: Not on file  . Intimate partner violence    Fear of current or ex partner: Not on file    Emotionally abused: Not on file    Physically abused: Not on file    Forced sexual activity: Not on file   Other Topics Concern  . Not on file  Social History Narrative   Moved here from [place]. Lives with common law wife and grandchildren. He is retired from "general labor."     Physical Exam  Vital Signs and Nursing Notes reviewed Vitals:   05/13/19 1132 05/13/19 1209  BP:    Pulse:  75  Resp:  18  Temp: (!) 96.9 F (36.1 C)   SpO2:  100%    CONSTITUTIONAL: Ill-appearing, in severe respiratory distress NEURO: Somnolent, opens eyes to voice, nodding head to yes or no questions, moving all extremities EYES:  eyes equal and reactive ENT/NECK:  no LAD, no JVD CARDIO: Tachycardic rate, well-perfused, normal S1 and S2 PULM: Scattered wheezing and rhonchi, poor air movement GI/GU:  normal bowel sounds, non-distended, non-tender MSK/SPINE:  No gross deformities, no edema SKIN:  no rash, atraumatic PSYCH: Difficult to assess due to severity of condition  Diagnostic and Interventional Summary    EKG Interpretation  Date/Time:    Ventricular Rate:    PR Interval:    QRS Duration:   QT Interval:    QTC Calculation:   R Axis:     Text Interpretation:         Labs Reviewed  CBC - Abnormal; Notable for the following components:      Result Value   RBC 3.78 (*)    MCV 126.5 (*)    MCH 43.4 (*)    All other components within normal limits  COMPREHENSIVE METABOLIC PANEL - Abnormal; Notable for the following components:   CO2 19 (*)    Glucose, Bld 262 (*)    Creatinine, Ser 1.62 (*)    Calcium 8.8 (*)    Albumin 3.4 (*)    AST 80 (*)    Alkaline Phosphatase 150 (*)    GFR calc non Af Amer 42 (*)    GFR calc Af Amer 49 (*)    Anion gap 17 (*)    All other components within normal limits  LACTIC ACID, PLASMA - Abnormal; Notable for the following components:   Lactic Acid, Venous 3.9 (*)    All other components within normal limits  POCT I-STAT 7, (LYTES, BLD GAS, ICA,H+H) - Abnormal; Notable for the following components:   pH, Arterial 7.337 (*)    pO2, Arterial 278.0  (*)    Calcium, Ion 1.14 (*)    All other components within normal limits  SARS CORONAVIRUS 2 BY RT PCR (HOSPITAL ORDER, [place] [facility])  CULTURE, BLOOD (SINGLE)  LACTIC ACID,  PLASMA  BLOOD GAS, ARTERIAL  INFLUENZA PANEL BY PCR (TYPE A & B)  TROPONIN I (HIGH SENSITIVITY)  TROPONIN I (HIGH SENSITIVITY)    DG Chest Portable 1 View  Final Result    CT ANGIO CHEST PE W OR WO CONTRAST    (Results Pending)    Medications  ketamine HCl 50 MG/5ML SOSY (has no administration in time range)  sodium chloride flush (NS) 0.9 % injection 3 mL (has no administration in time range)  propofol (DIPRIVAN) 1000 MG/100ML infusion (20 mcg/kg/min  99.4 kg Intravenous New Bag/Given 05/13/19 0942)  fentaNYL (SUBLIMAZE) injection 25 mcg (has no administration in time range)  fentaNYL (SUBLIMAZE) injection 25-100 mcg (100 mcg Intravenous Given 05/13/19 1232)  ketamine (KETALAR) injection (100 mg Intravenous Given 05/13/19 0930)  rocuronium (ZEMURON) injection (100 mg Intravenous Given 05/13/19 0931)  sodium chloride 0.9 % bolus 500 mL (500 mLs Intravenous New Bag/Given 05/13/19 1130)     .Critical Care Performed by: Maudie Flakes, MD Authorized by: Maudie Flakes, MD   Critical care provider statement:    Critical care time (minutes):  39   Critical care was necessary to treat or prevent imminent or life-threatening deterioration of the following conditions:  Respiratory failure   Critical care was time spent personally by me on the following activities:  Discussions with consultants, evaluation of patient's response to treatment, examination of patient, ordering and performing treatments and interventions, ordering and review of laboratory studies, ordering and review of radiographic studies, pulse oximetry, re-evaluation of patient's condition, obtaining history from patient or surrogate and review of old charts Procedure Name: Intubation Date/Time: 05/13/2019 10:59 AM  Performed by: Maudie Flakes, MD Pre-anesthesia Checklist: Patient identified, Patient being monitored, Emergency Drugs available, Timeout performed and Suction available Oxygen Delivery Method: Non-rebreather mask Preoxygenation: Pre-oxygenation with 100% oxygen Induction Type: Rapid sequence Ventilation: Mask ventilation without difficulty Laryngoscope Size: Glidescope and 4 Grade View: Grade II Tube size: 7.5 mm Number of attempts: 1 Airway Equipment and Method: Rigid stylet Placement Confirmation: ETT inserted through vocal cords under direct vision,  CO2 detector and Breath sounds checked- equal and bilateral Secured at: 23 cm Comments: Uncomplicated RSI using 123XX123 mg ketamine and 100 mg rocuronium      Critical Care  ED Course and Medical Decision Making  I have reviewed the triage vital signs and the nursing notes.  Pertinent labs & imaging results that were available during my care of the patient were reviewed by me and considered in my medical decision making (see below for details).  Concern for severe COPD exacerbation versus pulmonary embolism versus COVID-61 in this 73 year old male with history of intubations in the past due to COPD.  Given nebs, steroids, magnesium by EMS.  Initially lost IV access on arrival to the ED, moving very little air, given 0.3 mg intramuscular epinephrine.  Transition to ED BiPAP with improvement in oxygen saturation from 60s to 100%.  Intubated as described above, will obtain coronavirus testing and CTA chest.  [name] was evaluated in Emergency Department on 05/13/2019 for the symptoms described in the history of present illness. He was evaluated in the context of the global COVID-19 pandemic, which necessitated consideration that the patient might be at risk for infection with the SARS-CoV-2 virus that causes COVID-19. Institutional protocols and algorithms that pertain to the evaluation of patients at risk for COVID-19 are in  a state of rapid change based on information released by regulatory bodies including the CDC and federal and  state organizations. These policies and algorithms were followed during the patient's care in the ED.  Admitted to intensivist service for further care.  Barth Kirks. Sedonia Small, [place] [email or web address]  Final Clinical Impressions(s) / ED Diagnoses     ICD-10-CM   1. COPD exacerbation ([place])  J44.1   2. Acute respiratory failure with hypoxia (H[facility])  J96.01     ED Discharge Orders    None      Discharge Instructions Discussed with and Provided to Patient: Discharge Instructions   None       Maudie Flakes, MD 05/13/19 1244

## 2019-05-13 NOTE — H&P (Addendum)
NAME:  [name], MRN:  [id number], DOB:  October 27, 1945, LOS: 0 ADMISSION DATE:  05/13/2019, CONSULTATION DATE:  05/13/2019  REFERRING MD:  Sedonia Small of ER, CHIEF COMPLAINT:  Resp failure   Brief History   See below  History of present illness    [name] 73 y.o. has a history of multiple admissions for respiratory failure and intubations believe due to combination of COPD exacerbation and chronic acute on systolic heart failure exacerbations.  He presented to the ED with respiratory distress brought in by the EMS.  BiPAP was tried.  He was profoundly hypoxemic he was also obtunded.  Therefore he was intubated.  Postintubation is on propofol with soft blood pressures but not on vasopressors.  According to the nurse in the ER he does move all his 4 extremities and tries to get agitated when sedation weaned.  CT angiogram chest is pending.  COVID-19 test is negative.  His baseline creatinine appears between 1.3 and 1.6 mg percent.  Currently 1.6 mg percent.   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  No mold, hot tub, Jacuzzi, down pillows or comforters Smoking history: 40-60-pack-year smoking history.  Continues to smoke Travel history: Originally from [place].  Previously lived in [place].  No significant recent travel Relevant family history: No significant family history of lung disease \ Hospitalized in July and September 2020 with respiratory failure, requiring intubation.  COVID-19 tests were negative.  Treated for pulmonary edema, AECOPD and pneumonitis.  Echocardiogram shows EF 35-40% July 2020 which was thought to be due to nonischemic cardiomyopathy. Symptoms improved with aggressive diuresis and was extubated.  Also seen by cardiology for persistent atrial fibrillation.  Patient is on amiodarone and rate control with Coreg.   Per patient he has been on amiodarone for at least a year.  Discharged in September 2020 t with Trelegy inhaler.  He was previously not on any inhalers.  PFTs in 2015 showed severe COPD - gold stage 3 fev1 1.3L/47% and DLCO 62% with normal A1AT.  Current symptoms are chronic dyspnea on exertion.  Denies any cough, sputum production, fevers, chills Also has history of polycythemia with Jak 2 mutation and is on hydroxyurea for this   UDS uin 2020 - negative  Past Medical History     has a past medical history of Acute lower GI bleeding (02/15/2017), Atrial fibrillation ([place]), Cardiomyopathy, CHF (congestive heart failure) ([place]), COPD (chronic obstructive pulmonary disease) ([place]), Essential hypertension, GERD (gastroesophageal reflux disease), Gout, Hepatitis, History of atrial flutter, Myeloproliferative neoplasm ([place]) (08/15/2013), Obesity, Pneumonia (X 1), Primary polycythemia ([place]) (06/12/2013), Renal insufficiency, Substance abuse ([place]), and Syncope.   reports that he has been smoking cigarettes. He has a 5.00 pack-year smoking history. He has never used smokeless tobacco.  Past Surgical History:  Procedure Laterality Date   CARDIAC ELECTROPHYSIOLOGY MAPPING AND ABLATION  08/2010   [name] 09/07/2010 (12/14/2012)   COLONOSCOPY     15-20 years ago had colon in [place]   EXCISIONAL HEMORRHOIDECTOMY  1970's   LOOP RECORDER IMPLANT N/A 08/23/2013   Procedure: LOOP RECORDER IMPLANT;  Surgeon: Deboraha Sprang, MD;  Location: [facility] CATH LAB;  Service: Cardiovascular;  Laterality: N/A;   MULTIPLE EXTRACTIONS WITH ALVEOLOPLASTY Bilateral 01/24/2016   Procedure: MULTIPLE EXTRACTION WITH ALVEOLOPLASTY BILATERAL;  Surgeon: Diona Browner, DDS;  Location: [place];  Service: Oral Surgery;  Laterality: Bilateral;  MULTIPLE TOOTH EXTRACTIONS  01/24/2016   MULTIPLE EXTRACTION WITH ALVEOLOPLASTY BILATERAL (Bilateral)    No Known Allergies  Immunization History  Administered Date(s) Administered   Influenza,  High Dose Seasonal PF 04/11/2018   Influenza,inj,Quad PF,6+ Mos 03/24/2013, 05/25/2016, 04/24/2019   Influenza-Unspecified 04/17/2014, 05/16/2017   Pneumococcal Conjugate-13 05/24/2014   Pneumococcal Polysaccharide-23 12/15/2012, 04/13/2018   Zoster 05/16/2017   Zoster Recombinat (Shingrix) 05/16/2017    Family History  Problem Relation Age of Onset   Diabetes Mother    Hypertension Mother    Cirrhosis Father    Alcohol abuse Father    Colon cancer Neg Hx    Rectal cancer Neg Hx    Stomach cancer Neg Hx      Current Facility-Administered Medications:    fentaNYL (SUBLIMAZE) injection 25 mcg, 25 mcg, Intravenous, Q15 min PRN, Maudie Flakes, MD   fentaNYL (SUBLIMAZE) injection 25-100 mcg, 25-100 mcg, Intravenous, Q30 min PRN, Maudie Flakes, MD, 100 mcg at 05/13/19 1232   ketamine HCl 50 MG/5ML SOSY, , , ,    propofol (DIPRIVAN) 1000 MG/100ML infusion, 0-50 mcg/kg/min, Intravenous, Continuous, Maudie Flakes, MD, Last Rate: 11.93 mL/hr at 05/13/19 0942, 20 mcg/kg/min at 05/13/19 0942   sodium chloride flush (NS) 0.9 % injection 3 mL, 3 mL, Intravenous, Once, Maudie Flakes, MD  Current Outpatient Medications:    albuterol (PROAIR HFA) 108 (90 Base) MCG/ACT inhaler, Inhale 1-2 puffs into the lungs every 6 (six) hours as needed for wheezing or shortness of breath., Disp: 18 g, Rfl: 3   albuterol (PROVENTIL) (2.5 MG/3ML) 0.083% nebulizer solution, Take 3 mLs (2.5 mg total) by nebulization 2 (two) times daily as needed for wheezing or shortness of breath., Disp: 75 mL, Rfl: 0   amiodarone (PACERONE) 200 MG tablet, TAKE 1/2 TABLET BY MOUTH EVERY DAY (Patient taking differently: Take 100 mg by mouth daily. ), Disp: 45 tablet, Rfl: 3   camphor-menthol (SARNA) lotion, Apply topically 3 (three) times daily. (Patient taking differently: Apply 1 application topically daily as needed for itching. ), Disp: 222 mL, Rfl: 0   carvedilol (COREG) 3.125 MG tablet, Take 1  tablet in the morning and 2 tablets in the evening (Patient taking differently: Take 3.125-6.25 mg by mouth See admin instructions. Take 1 table (3.125 mg totally) by mouth t in the morning and 2 tablets (6.25 mg totally) by mouth in the evening), Disp: 270 tablet, Rfl: 2   Fluticasone-Umeclidin-Vilant (TRELEGY ELLIPTA) 100-62.5-25 MCG/INH AEPB, Inhale 1 puff into the lungs daily., Disp: 60 each, Rfl: 5   folic acid (FOLVITE) 1 MG tablet, Take 1 tablet (1 mg total) by mouth daily., Disp: 90 tablet, Rfl: 3   furosemide (LASIX) 40 MG tablet, Take 1 tablet (40 mg total) by mouth 2 (two) times daily., Disp: 60 tablet, Rfl: 0   hydroxyurea (HYDREA) 500 MG capsule, Take 2 capsules (1,000 mg total) by mouth daily. MAY TAKE WITH FOOD TO MINIMIZE GI SIDE EFFECTS (Patient taking differently: Take 1,000 mg by mouth daily. ), Disp: 180 capsule, Rfl: 3   SUBOXONE 8-2 MG FILM, Place 1 Film under the tongue daily. , Disp: , Rfl:    warfarin (COUMADIN) 2.5 MG tablet, Take 1 tablet (2.5 mg total) by mouth daily at 6 PM., Disp: 30 tablet, Rfl: 2   Significant Hospital Events   May 13, 2019 admission >>  Consults:   x Procedures:  May 13, 2019-intubation  Significant Diagnostic Tests:  May 13, 2019-CT angiogram chest  Micro Data:  May 13, 2019 0 COVID-19L: Negative  Antimicrobials:   Vancomycin May 13, 2019>> Cefepime May 13, 2019 >>   Interim history/subjective:   x  Objective   Blood pressure (!) 88/78, pulse 75, temperature (!) 96.9 F (36.1 C), temperature source Tympanic, resp. rate 18, height 5\' 6"  (1.676 m), weight 99.4 kg, SpO2 100 %.    Vent Mode: PRVC FiO2 (%):  [50 %-100 %] 50 % Set Rate:  [18 bmp] 18 bmp Vt Set:  [510 mL] 510 mL PEEP:  [5 cmH20] 5 cmH20 Plateau Pressure:  [19 cmH20-22 cmH20] 19 cmH20   Intake/Output Summary (Last 24 hours) at 05/13/2019 1341 Last data filed at 05/13/2019 1246 Gross per 24 hour  Intake 1000 ml  Output 10 ml  Net  990 ml   Filed Weights   05/13/19 0941  Weight: 99.4 kg    Examination: General: Morbidly obese male seen in resuscitation room number C at [facility], ER.  On the ventilator HENT: Endotracheal tube present.  OG tube present. Lungs: Clear to auscultation bilaterally Cardiovascular: Normal heart sounds regular rate and rhythm Abdomen: Obese without any inguinal hernia Extremities: Evidence in the thighs about previous mottling.  Both distal extremities and feet appear discolored from chronic venous stasis Neuro: RASS sedation score -3 but easily gets agitated without  Diprivan GU: External genitalia normal  Resolved Hospital Problem list   x  Assessment & Plan:  ASSESSMENT / PLAN:  PULMONARY  A:  Gold stag 3 copd - on trelegy Acute resp failure resulting in intubation - this is recurrent. Again probably AECOPD v AECHF. Rule out PE  Could be suboxine + non-compliance related  P:   VDRF - permissive hypercapnia BD Steroids    A:   Chronic pain - on subxone Obtundation - pre vent Agitated post intubation  P:   Diprivan gtt RASS 0 to -2      A:   BP soft on diprivan and post iinubation  P:  MAP > 65 Fluid Bolus   A: Known chronic s-CHF - ef 35% : on lasix, coreg,    P: - rule out MI - check BNP   A: A Fib - on amio and  coumadin  P: - rule out MI - check BNP - Pharmacy consult for heparin gtt   A:   Need to rule out infectous exacerbation - covid-19 negative 10/24 AT risk for HAP  P:   Check RVP Check urine strep Check urine leg Check PCT Check trach aspirate Pan culture Empiric antibioptics   A:  CKD 1.2-1.6 mg% baseline creat AKI  - creat 1.6mg %\ Mild lactic acidosis  P:  Hydrate Maintain MAP\ Monitor   A:  At risk of electrolyte imbalance  P: monitoir    A:   At risk stress ulcer transaminitis  P:   ppi NPO Nutn consult Track LFT   A:  Hx of Polycythemia - on hydroxyuyrea At risk anemia critical  illness   P:  - PRBC for hgb </= 6.9gm%    - exceptions are   -  if ACS susepcted/confirmed then transfuse for hgb </= 8.0gm%,  or    -  active bleeding with hemodynamic instability, then transfuse regardless of hemoglobin value   At at all times try to transfuse 1 unit prbc as possible with exception of active hemorrhage  - hold hydroxyurea for now    A:   At risk hyperglycemia P:   ssi   At risk bed sores  Plan  turn   Best practice:  Diet: npo Pain/Anxiety/Delirium protocol (if indicated): diprivan gtt VAP protocol (if indicated): yes DVT prophylaxis: coumadin/IV heparin (A Fib) GI prophylaxis: ppi Glucose control: ssi Mobility: bed rest Code Status: full code Family Communication: none in ER Disposition: ICU    [place]   The patient Perl Klauser is critically ill with multiple organ systems failure and requires high complexity decision making for assessment and support, frequent evaluation and titration of therapies, application of advanced monitoring technologies and extensive interpretation of multiple databases.   Critical Care Time devoted to patient care services described in this note is  30  Minutes. This time reflects time of care of this signee Dr Brand Males. This critical care time does not reflect procedure time, or teaching time or supervisory time of PA/NP/Med student/Med Resident etc but could involve care discussion time     Dr. Brand Males, M.D., [facility].C.P Pulmonary and Critical Care Medicine Staff Physician [place] [facility] Pager: [phone], If no answer or between  15:00h - 7:00h: call [phone]  05/13/2019 1:41 PM    LABS   Results for [name] EILERT, OCHELTREE (MRN [id number]) as of 05/13/2019 13:43  Ref. Range 02/09/2019 19:55 03/31/2019 09:15 03/31/2019 11:00 04/02/2019 14:57 05/13/2019 09:52  B Natriuretic Peptide Latest Ref Range: 0.0 - 100.0 pg/mL   1,338.6 (H)  339.7 (H)   Troponin I (High Sensitivity) Latest Ref Range: <18 ng/L 45 (H) 15 36 (H)  13   PULMONARY Recent Labs  Lab 05/13/19 1137  PHART 7.337*  PCO2ART 48.0  PO2ART 278.0*  HCO3 25.7  TCO2 27  O2SAT 100.0    CBC Recent Labs  Lab 05/13/19 0952 05/13/19 1137  HGB 16.4 13.9  HCT 47.8 41.0  WBC 10.4  --   PLT 195  --     COAGULATION Recent Labs  Lab 05/08/19 1141  INR 2.9    CARDIAC  No results for input(s): TROPONINI in the last 168 hours. No results for input(s): PROBNP in the last 168 hours.   CHEMISTRY Recent Labs  Lab 05/13/19 0952 05/13/19 1137  NA 143 142  K 4.2 4.1  CL 107  --   CO2 19*  --   GLUCOSE 262*  --   BUN 20  --   CREATININE 1.62*  --   CALCIUM 8.8*  --    Estimated Creatinine Clearance: 46.1 mL/min (A) (by C-G formula based on SCr of 1.62 mg/dL (H)).   LIVER Recent Labs  Lab 05/08/19 1141 05/13/19 0952  AST  --  80*  ALT  --  32  ALKPHOS  --  150*  BILITOT  --  0.5  PROT  --  6.9  ALBUMIN  --  3.4*  INR 2.9  --      INFECTIOUS Recent Labs  Lab 05/13/19 0952  LATICACIDVEN 3.9*     ENDOCRINE CBG (last 3)  No results for input(s): GLUCAP in the last 72 hours.       IMAGING x48h  - image(s) personally visualized  -   highlighted in bold Dg Chest Portable 1 View  Result Date: 05/13/2019 CLINICAL DATA:  Shortness of breath. EXAM: PORTABLE CHEST 1 VIEW COMPARISON:  04/02/2019 FINDINGS: ETT tip is just above the carina. There is an enteric tube with tip below the field of view. Mild cardiac enlargement and aortic atherosclerosis. Persistent right midlung opacity. Pulmonary vascular congestion noted. IMPRESSION: 1. No change  in aeration to the right lung compared with previous exam. 2. Pulmonary vascular congestion. 3. ET tube tip is just above the level of the carina. Electronically Signed   By: Kerby Moors M.D.   On: 05/13/2019 10:24

## 2019-05-14 ENCOUNTER — Inpatient Hospital Stay (HOSPITAL_COMMUNITY): Payer: Medicare Other

## 2019-05-14 DIAGNOSIS — J441 Chronic obstructive pulmonary disease with (acute) exacerbation: Principal | ICD-10-CM

## 2019-05-14 DIAGNOSIS — J189 Pneumonia, unspecified organism: Secondary | ICD-10-CM

## 2019-05-14 DIAGNOSIS — J9601 Acute respiratory failure with hypoxia: Secondary | ICD-10-CM | POA: Diagnosis not present

## 2019-05-14 LAB — CBC
HCT: 33.7 % — ABNORMAL LOW (ref 39.0–52.0)
Hemoglobin: 11.7 g/dL — ABNORMAL LOW (ref 13.0–17.0)
MCH: 42.7 pg — ABNORMAL HIGH (ref 26.0–34.0)
MCHC: 34.7 g/dL (ref 30.0–36.0)
MCV: 123 fL — ABNORMAL HIGH (ref 80.0–100.0)
Platelets: 115 10*3/uL — ABNORMAL LOW (ref 150–400)
RBC: 2.74 MIL/uL — ABNORMAL LOW (ref 4.22–5.81)
RDW: 14.2 % (ref 11.5–15.5)
WBC: 7.7 10*3/uL (ref 4.0–10.5)
nRBC: 0 % (ref 0.0–0.2)

## 2019-05-14 LAB — GLUCOSE, CAPILLARY
Glucose-Capillary: 116 mg/dL — ABNORMAL HIGH (ref 70–99)
Glucose-Capillary: 121 mg/dL — ABNORMAL HIGH (ref 70–99)
Glucose-Capillary: 127 mg/dL — ABNORMAL HIGH (ref 70–99)
Glucose-Capillary: 145 mg/dL — ABNORMAL HIGH (ref 70–99)
Glucose-Capillary: 146 mg/dL — ABNORMAL HIGH (ref 70–99)
Glucose-Capillary: 204 mg/dL — ABNORMAL HIGH (ref 70–99)

## 2019-05-14 LAB — POCT I-STAT 7, (LYTES, BLD GAS, ICA,H+H)
Acid-base deficit: 3 mmol/L — ABNORMAL HIGH (ref 0.0–2.0)
Bicarbonate: 22.9 mmol/L (ref 20.0–28.0)
Calcium, Ion: 1.14 mmol/L — ABNORMAL LOW (ref 1.15–1.40)
HCT: 37 % — ABNORMAL LOW (ref 39.0–52.0)
Hemoglobin: 12.6 g/dL — ABNORMAL LOW (ref 13.0–17.0)
O2 Saturation: 98 %
Patient temperature: 97.8
Potassium: 4.3 mmol/L (ref 3.5–5.1)
Sodium: 143 mmol/L (ref 135–145)
TCO2: 24 mmol/L (ref 22–32)
pCO2 arterial: 42.7 mmHg (ref 32.0–48.0)
pH, Arterial: 7.336 — ABNORMAL LOW (ref 7.350–7.450)
pO2, Arterial: 107 mmHg (ref 83.0–108.0)

## 2019-05-14 LAB — URINALYSIS, ROUTINE W REFLEX MICROSCOPIC
Bilirubin Urine: NEGATIVE
Glucose, UA: NEGATIVE mg/dL
Ketones, ur: NEGATIVE mg/dL
Leukocytes,Ua: NEGATIVE
Nitrite: NEGATIVE
Protein, ur: 100 mg/dL — AB
RBC / HPF: >50 RBC/hpf — ABNORMAL HIGH (ref 0–5)
Specific Gravity, Urine: 1.041 — ABNORMAL HIGH (ref 1.005–1.030)
pH: 6 (ref 5.0–8.0)

## 2019-05-14 LAB — RESPIRATORY PANEL BY PCR

## 2019-05-14 LAB — URINE CULTURE: Culture: NO GROWTH

## 2019-05-14 LAB — BASIC METABOLIC PANEL
Anion gap: 8 (ref 5–15)
BUN: 23 mg/dL (ref 8–23)
CO2: 20 mmol/L — ABNORMAL LOW (ref 22–32)
Calcium: 7 mg/dL — ABNORMAL LOW (ref 8.9–10.3)
Chloride: 114 mmol/L — ABNORMAL HIGH (ref 98–111)
Creatinine, Ser: 1.22 mg/dL (ref 0.61–1.24)
GFR calc Af Amer: >60 mL/min (ref >60)
GFR calc non Af Amer: 59 mL/min — ABNORMAL LOW (ref >60)
Glucose, Bld: 145 mg/dL — ABNORMAL HIGH (ref 70–99)
Potassium: 4 mmol/L (ref 3.5–5.1)
Sodium: 142 mmol/L (ref 135–145)

## 2019-05-14 LAB — TROPONIN I (HIGH SENSITIVITY)
Troponin I (High Sensitivity): 114 ng/L (ref <18)
Troponin I (High Sensitivity): 74 ng/L — ABNORMAL HIGH (ref <18)

## 2019-05-14 LAB — LACTIC ACID, PLASMA
Lactic Acid, Venous: 3.1 mmol/L (ref 0.5–1.9)
Lactic Acid, Venous: 1.6 mmol/L (ref 0.5–1.9)
Lactic Acid, Venous: 1.7 mmol/L (ref 0.5–1.9)

## 2019-05-14 LAB — PHOSPHORUS: Phosphorus: 2.5 mg/dL (ref 2.5–4.6)

## 2019-05-14 LAB — MAGNESIUM: Magnesium: 1.7 mg/dL (ref 1.7–2.4)

## 2019-05-14 LAB — HEMOGLOBIN AND HEMATOCRIT, BLOOD
HCT: 39 % (ref 39.0–52.0)
Hemoglobin: 13.4 g/dL (ref 13.0–17.0)

## 2019-05-14 LAB — TRIGLYCERIDES: Triglycerides: 155 mg/dL — ABNORMAL HIGH (ref <150)

## 2019-05-14 LAB — PROTIME-INR
INR: 2.5 — ABNORMAL HIGH (ref 0.8–1.2)
Prothrombin Time: 26.2 seconds — ABNORMAL HIGH (ref 11.4–15.2)

## 2019-05-14 MED ORDER — ORAL CARE MOUTH RINSE
15.0000 mL | Freq: Two times a day (BID) | OROMUCOSAL | Status: DC
  Administered 2019-05-14 – 2019-05-17 (×7): 15 mL via OROMUCOSAL

## 2019-05-14 MED ORDER — SODIUM CHLORIDE 0.9 % IV BOLUS
500.0000 mL | Freq: Once | INTRAVENOUS | Status: AC
  Administered 2019-05-14: 500 mL via INTRAVENOUS

## 2019-05-14 MED ORDER — IPRATROPIUM-ALBUTEROL 0.5-2.5 (3) MG/3ML IN SOLN
3.0000 mL | Freq: Four times a day (QID) | RESPIRATORY_TRACT | Status: DC
  Administered 2019-05-14 – 2019-05-15 (×8): 3 mL via RESPIRATORY_TRACT
  Filled 2019-05-14 (×7): qty 3

## 2019-05-14 MED ORDER — METHYLPREDNISOLONE SODIUM SUCC 125 MG IJ SOLR
60.0000 mg | Freq: Once | INTRAMUSCULAR | Status: AC
  Administered 2019-05-14: 60 mg via INTRAVENOUS
  Filled 2019-05-14: qty 2

## 2019-05-14 MED ORDER — METHYLPREDNISOLONE SODIUM SUCC 40 MG IJ SOLR
40.0000 mg | Freq: Two times a day (BID) | INTRAMUSCULAR | Status: DC
  Administered 2019-05-14 – 2019-05-16 (×5): 40 mg via INTRAVENOUS
  Filled 2019-05-14 (×5): qty 1

## 2019-05-14 MED ORDER — FENTANYL 2500MCG IN NS 250ML (10MCG/ML) PREMIX INFUSION
0.0000 ug/h | INTRAVENOUS | Status: DC
  Administered 2019-05-14: 50 ug/h via INTRAVENOUS
  Administered 2019-05-14: 400 ug/h via INTRAVENOUS
  Filled 2019-05-14 (×2): qty 250

## 2019-05-14 NOTE — Progress Notes (Signed)
CRITICAL VALUE ALERT  Critical Value:  Lactic acid 3.1  Date & Time Notified:  05/14/19 1:58 AM   Provider Notified: Warren Lacy

## 2019-05-14 NOTE — Progress Notes (Signed)
NAME:  [name], MRN:  [id number], DOB:  1945-11-20, LOS: 1 ADMISSION DATE:  05/13/2019, CONSULTATION DATE:  05/13/2019  REFERRING MD:  Sedonia Small of ER, CHIEF COMPLAINT:  Resp failure   History of present illness    [name] 73 y.o. has a history of multiple admissions for respiratory failure and intubations believe due to combination of COPD exacerbation and chronic acute on systolic heart failure exacerbations.  He presented to the ED with respiratory distress brought in by the EMS.  BiPAP was tried.  He was profoundly hypoxemic he was also obtunded.  Therefore he was intubated.  Postintubation is on propofol with soft blood pressures but not on vasopressors.  According to the nurse in the ER he does move all his 4 extremities and tries to get agitated when sedation weaned.  CT angiogram chest is pending.  COVID-19 test is negative.  His baseline creatinine appears between 1.3 and 1.6 mg percent.  Currently 1.6 mg percent.   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  No mold, hot tub, Jacuzzi, down pillows or comforters Smoking history: 40-60-pack-year smoking history.  Continues to smoke Travel history: Originally from [place].  Previously lived in [place].  No significant recent travel Relevant family history: No significant family history of lung disease \ Hospitalized in July and September 2020 with respiratory failure, requiring intubation.  COVID-19 tests were negative.  Treated for pulmonary edema, AECOPD and pneumonitis.  Echocardiogram shows EF 35-40% July 2020 which was thought to be due to nonischemic cardiomyopathy. Symptoms improved with aggressive diuresis and was extubated.  Also seen by cardiology for persistent atrial fibrillation.  Patient is on amiodarone and rate control with Coreg.  Per patient he has been on  amiodarone for at least a year.  Discharged in September 2020 t with Trelegy inhaler.  He was previously not on any inhalers.  PFTs in 2015 showed severe COPD - gold stage 3 fev1 1.3L/47% and DLCO 62% with normal A1AT.  Current symptoms are chronic dyspnea on exertion.  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   reports that he has been smoking cigarettes. He has a 5.00 pack-year smoking history. He has never used smokeless tobacco.  Past Surgical History:  Procedure Laterality Date  . CARDIAC ELECTROPHYSIOLOGY MAPPING AND ABLATION  08/2010   [name] 09/07/2010 (12/14/2012)  . COLONOSCOPY     15-20 years ago had colon in [place]  . EXCISIONAL HEMORRHOIDECTOMY  1970's  . LOOP RECORDER IMPLANT N/A 08/23/2013   Procedure: LOOP RECORDER IMPLANT;  Surgeon: Deboraha Sprang, MD;  Location: [facility] CATH LAB;  Service: Cardiovascular;  Laterality: N/A;  . MULTIPLE EXTRACTIONS WITH ALVEOLOPLASTY Bilateral 01/24/2016   Procedure: MULTIPLE EXTRACTION WITH ALVEOLOPLASTY BILATERAL;  Surgeon: Diona Browner, DDS;  Location: [facility];  Service: Oral Surgery;  Laterality: Bilateral;  . MULTIPLE TOOTH EXTRACTIONS  01/24/2016  MULTIPLE EXTRACTION WITH ALVEOLOPLASTY BILATERAL (Bilateral)    No Known Allergies  Immunization History  Administered Date(s) Administered  . Influenza, High Dose Seasonal PF  04/11/2018  . Influenza,inj,Quad PF,6+ Mos 03/24/2013, 05/25/2016, 04/24/2019  . Influenza-Unspecified 04/17/2014, 05/16/2017  . Pneumococcal Conjugate-13 05/24/2014  . Pneumococcal Polysaccharide-23 12/15/2012, 04/13/2018  . Zoster 05/16/2017  . Zoster Recombinat (Shingrix) 05/16/2017    Family History  Problem Relation Age of Onset  . Diabetes Mother   . Hypertension Mother   . Cirrhosis Father   . Alcohol abuse Father   . Colon cancer Neg Hx   . Rectal cancer Neg Hx   . Stomach cancer Neg Hx      Current Facility-Administered Medications:  .  ceFEPIme (MAXIPIME) 2 g in sodium chloride 0.9 % 100 mL IVPB, 2 g, Intravenous, Q12H, Kris Mouton, [facility], Stopped at 05/14/19 [phone] .  chlorhexidine gluconate (MEDLINE KIT) (PERIDEX) 0.12 % solution 15 mL, 15 mL, Mouth Rinse, BID, Ramaswamy, Murali, MD, 15 mL at 05/14/19 0749 .  Chlorhexidine Gluconate Cloth 2 % PADS 6 each, 6 each, Topical, Daily, Brand Males, MD, 6 each at 05/13/19 1624 .  dextrose 5 %-0.45 % sodium chloride infusion, , Intravenous, Continuous, Ramaswamy, Murali, MD, Last Rate: 10 mL/hr at 05/14/19 0900 .  fentaNYL (SUBLIMAZE) injection 25 mcg, 25 mcg, Intravenous, Q15 min PRN, Maudie Flakes, MD, 25 mcg at 05/14/19 0039 .  fentaNYL (SUBLIMAZE) injection 25-100 mcg, 25-100 mcg, Intravenous, Q30 min PRN, Maudie Flakes, MD, 100 mcg at 05/14/19 0749 .  fentaNYL 2556mg in NS 2573m(1071mml) infusion-PREMIX, 0-400 mcg/hr, Intravenous, Titrated, SomAnders SimmondsD, Last Rate: 40 mL/hr at 05/14/19 0900, 400 mcg/hr at 05/14/19 0900 .  insulin aspart (novoLOG) injection 3-9 Units, 3-9 Units, Subcutaneous, Q4H, RamBrand MalesD, 3 Units at 05/14/19 0447 .  MEDLINE mouth rinse, 15 mL, Mouth Rinse, 10 times per day, RamBrand MalesD, 15 mL at 05/14/19 0604 .  methylPREDNISolone sodium succinate (SOLU-MEDROL) 125 mg/2 mL injection 60 mg, 60 mg, Intravenous, Once, Romina Divirgilio A, MD .  midazolam (VERSED)  injection 1 mg, 1 mg, Intravenous, Q15 min PRN, RamBrand MalesD, 1 mg at 05/14/19 0103 .  midazolam (VERSED) injection 1 mg, 1 mg, Intravenous, Q2H PRN, RamBrand MalesD, 1 mg at 05/14/19 0030 .  norepinephrine (LEVOPHED) '4mg'$  in 250m84memix infusion, 0-40 mcg/min, Intravenous, Titrated, SommAnders Simmonds .  sodium chloride flush (NS) 0.9 % injection 3 mL, 3 mL, Intravenous, Once, BeroMaudie Flakes  [facility]   May 13, 2019 admission >>  Consults:   PCCM 05/14/2019 Procedures:  May 13, 2019-intubation  Significant Diagnostic Tests:  May 13, 2019-CT angiogram chest IMPRESSION: 1.  No demonstrable pulmonary embolus.  2. Ascending thoracic diameter measures 4.9 x 4.9 cm. No dissection evident. There are foci of aortic atherosclerosis as well as foci of coronary artery calcification. Ascending thoracic aortic aneurysm. Recommend semi-annual imaging followup by CTA or MRA and referral to cardiothoracic surgery if not already obtained. This recommendation follows 2010 ACCF/AHA/AATS/ACR/ASA/SCA/SCAI/SIR/STS/SVM Guidelines for the Diagnosis and Management of Patients With Thoracic Aortic Disease. Circulation. 2010; 121: E2: [id number] aneurysm NOS (ICD10-I71.9).  3. Multifocal airspace consolidation, most severe in the lower lobes bilaterally consistent with multifocal pneumonia. Aspiration could present in this manner and may be present concurrently with pneumonia.  4. Endotracheal tube tip is at the origin of the right main bronchus. Advise withdrawing endotracheal tube approximately 3 cm.  5. Right-sided thyroid nodule, essentially stable  compared to 2015 study.  6.  Nasogastric tube tip in stomach.  7.  No evident adenopathy.  Aortic aneurysm NOS (ICD10-I71.9).  Critical Value/emergent results were called by telephone at the time of interpretation on 05/13/2019 at 3:05 pm to providerDr. Johnney Killian, ED physician, who  verbally acknowledged these results. Micro Data:  May 13, 2019 0 COVID-19L: Negative  Antimicrobials:   Vancomycin May 13, 2019 Cefepime May 13, 2019 >>   Interim history/subjective:   Is tolerating weaning On pressure support Awake and alert, denies any significant complaints at present  Objective   Blood pressure 106/66, pulse 65, temperature 97.8 F (36.6 C), temperature source Oral, resp. rate 16, height '5\' 6"'$  (1.676 m), weight 99.4 kg, SpO2 100 %.    Vent Mode: PSV FiO2 (%):  [40 %-100 %] 40 % Set Rate:  [18 bmp] 18 bmp Vt Set:  [510 mL-540 mL] 540 mL PEEP:  [5 cmH20] 5 cmH20 Pressure Support:  [5 cmH20] 5 cmH20 Plateau Pressure:  [19 cmH20-22 cmH20] 19 cmH20   Intake/Output Summary (Last 24 hours) at 05/14/2019 3958 Last data filed at 05/14/2019 0900 Gross per 24 hour  Intake 3719.39 ml  Output 355 ml  Net 3364.39 ml   Filed Weights   05/13/19 0941  Weight: 99.4 kg    Examination: General: Morbidly obese, does appear comfortable  HENT: Endotracheal tube in place, moist oral mucosa Lungs: Faint wheezing Cardiovascular: S1-S2 appreciated abdomen: Obese, bowel sounds appreciated Extremities: Evidence of chronic venous stasis,  neuro: Alert and oriented GU:   Chest x-ray reviewed significant for bibasal infiltrate Lab data reviewed-creatinine 1.6 Resolved Hospital Problem list    Assessment & Plan:   Acute hypoxemic respiratory failure -We will continue weaning -The plan will be to extubate him if this tolerating pressure support well -Continue oxygen supplementation  Stage III COPD Acute exacerbation of COPD -Continue bronchodilators-DuoNeb -Steroids-Solu-Medrol 40 q. 12 -Has a history of noncompliance  Bibasal pneumonia -Continue cefepime  History of systolic heart failure -Diuretics -Coreg  Atrial fibrillation -Amiodarone -Anticoagulation  Chronic kidney disease -Trend electrolytes -Maintain MAP greater than 65   History of polycythemia -On hydroxyurea   Best practice:  Diet: npo Pain/Anxiety/Delirium protocol (if indicated): [place] VAP protocol (if indicated): In place DVT prophylaxis: coumadin/IV heparin (A Fib) GI prophylaxis: ppi Glucose control: ssi Mobility: bed rest Code Status: full code Family Communication: Will update Disposition: ICU  The patient is critically ill with multiple organ systems failure and requires high complexity decision making for assessment and support, frequent evaluation and titration of therapies, application of advanced monitoring technologies and extensive interpretation of multiple databases. Critical Care Time devoted to patient care services described in this note independent of APP/resident time (if applicable)  is 32 minutes.   Sherrilyn Rist MD [facility] Personal pager: [phone] If unanswered, please page CCM On-call: [phone]

## 2019-05-14 NOTE — Progress Notes (Signed)
[facility] Progress Note Patient Name: [name] DOB: Aug 13, 1945 MRN: [id number]   Date of Service  05/14/2019  HPI/Events of Note  Hematuria - Hgb = 12.5. INR = 2.5. Patient on Warfarin at home for AFIB. Not currently on Lovenox or Heparin. Reluctant to give FFP, given therapeutic INR and Hx of AFIB.  eICU Interventions  Will order: 1. H/H at 12 noon. 2. Continue to monitor amount of hematuria.      Intervention Category Major Interventions: Other:  Lysle Dingwall 05/14/2019, 5:44 AM

## 2019-05-14 NOTE — Progress Notes (Signed)
Pulled patient tube back to 23cm per Dr. Montine Circle after chest x-ray.  RN in room at time.  No patient distress noted at this time.  Will continue to monitor.

## 2019-05-14 NOTE — Progress Notes (Signed)
RN noticed blood in pts foley bag. RN notified Warren Lacy MD. Will continue to monitor closely.  Clint Bolder, RN 05/14/19 5:46 AM

## 2019-05-14 NOTE — Progress Notes (Signed)
[facility] Progress Note Patient Name: [name] DOB: 1946-04-02 MRN: [id number]   Date of Service  05/14/2019  HPI/Events of Note  Patient remains bradycardic - HR = 40-60. Patient is currently on a Propofol IV infusion for sedation.   eICU Interventions  Will order: 1. D/C Propofol IV infusion.  2. Fentanyl IV infusion. Titrate to RASS = 0 to -1.     Intervention Category Major Interventions: Arrhythmia - evaluation and management  Sommer,Steven Eugene 05/14/2019, 12:21 AM

## 2019-05-14 NOTE — Progress Notes (Signed)
[facility] Progress Note Patient Name: [name] DOB: 1946/04/28 MRN: [id number]   Date of Service  05/14/2019  HPI/Events of Note  Lactic Acid = 2.2 --> 2.7 --> 3.1. Patient has LVEF = 35% to 40%. BP = 104/57 with MAP = 72. Hgb = 14.6. No CVL or CVP available.   eICU Interventions  Will order: 1. Bolus with 0.9 NaCl 500 mL IV over 30 minutes.  2. Continue to trend Lactic Acid.      Intervention Category Major Interventions: Acid-Base disturbance - evaluation and management  Sommer,Steven Eugene 05/14/2019, 2:10 AM

## 2019-05-14 NOTE — Progress Notes (Signed)
Placed patient on wean 5/5 and 40% , patient tolerated well.

## 2019-05-14 NOTE — Progress Notes (Signed)
Patient extubated per MD's order, placed on 2LNC, no stridor heard, SATS 100%, pt able to vocalize, will continue to monitor patient.

## 2019-05-15 DIAGNOSIS — J441 Chronic obstructive pulmonary disease with (acute) exacerbation: Secondary | ICD-10-CM | POA: Diagnosis not present

## 2019-05-15 DIAGNOSIS — J189 Pneumonia, unspecified organism: Secondary | ICD-10-CM | POA: Diagnosis not present

## 2019-05-15 DIAGNOSIS — J9601 Acute respiratory failure with hypoxia: Secondary | ICD-10-CM | POA: Diagnosis not present

## 2019-05-15 LAB — GLUCOSE, CAPILLARY
Glucose-Capillary: 155 mg/dL — ABNORMAL HIGH (ref 70–99)
Glucose-Capillary: 138 mg/dL — ABNORMAL HIGH (ref 70–99)
Glucose-Capillary: 132 mg/dL — ABNORMAL HIGH (ref 70–99)
Glucose-Capillary: 136 mg/dL — ABNORMAL HIGH (ref 70–99)
Glucose-Capillary: 131 mg/dL — ABNORMAL HIGH (ref 70–99)

## 2019-05-15 LAB — LEGIONELLA PNEUMOPHILA SEROGP 1 UR AG: L. pneumophila Serogp 1 Ur Ag: NEGATIVE

## 2019-05-15 LAB — POTASSIUM: Potassium: 4.5 mmol/L (ref 3.5–5.1)

## 2019-05-15 LAB — PROTIME-INR
INR: 2 — ABNORMAL HIGH (ref 0.8–1.2)
Prothrombin Time: 22.8 seconds — ABNORMAL HIGH (ref 11.4–15.2)

## 2019-05-15 LAB — TRIGLYCERIDES: Triglycerides: 127 mg/dL (ref <150)

## 2019-05-15 MED ORDER — PREDNISONE 20 MG PO TABS
40.0000 mg | ORAL_TABLET | Freq: Every day | ORAL | Status: DC
  Administered 2019-05-16 – 2019-05-17 (×2): 40 mg via ORAL
  Filled 2019-05-15 (×2): qty 2

## 2019-05-15 MED ORDER — DOXYCYCLINE HYCLATE 100 MG PO TABS
100.0000 mg | ORAL_TABLET | Freq: Two times a day (BID) | ORAL | Status: DC
  Administered 2019-05-15 – 2019-05-17 (×5): 100 mg via ORAL
  Filled 2019-05-15 (×5): qty 1

## 2019-05-15 MED ORDER — WARFARIN SODIUM 2.5 MG PO TABS: 2.5000 mg | ORAL_TABLET | Freq: Every day | ORAL | Status: DC

## 2019-05-15 MED ORDER — WARFARIN SODIUM 2.5 MG PO TABS
2.5000 mg | ORAL_TABLET | Freq: Once | ORAL | Status: AC
  Administered 2019-05-15: 2.5 mg via ORAL
  Filled 2019-05-15 (×2): qty 1

## 2019-05-15 MED ORDER — FLUTICASONE-UMECLIDIN-VILANT 100-62.5-25 MCG/INH IN AEPB: 1.0000 | INHALATION_SPRAY | Freq: Every day | RESPIRATORY_TRACT | Status: DC

## 2019-05-15 MED ORDER — BUPRENORPHINE HCL 8 MG SL SUBL
8.0000 mg | SUBLINGUAL_TABLET | Freq: Every day | SUBLINGUAL | Status: DC
  Administered 2019-05-15 – 2019-05-17 (×3): 8 mg via SUBLINGUAL
  Filled 2019-05-15 (×3): qty 1

## 2019-05-15 MED ORDER — PREDNISONE 10 MG PO TABS
10.0000 mg | ORAL_TABLET | Freq: Every day | ORAL | Status: DC
  Administered 2019-05-16: 10 mg via ORAL
  Filled 2019-05-15: qty 1

## 2019-05-15 MED ORDER — INSULIN ASPART 100 UNIT/ML ~~LOC~~ SOLN
0.0000 [IU] | Freq: Three times a day (TID) | SUBCUTANEOUS | Status: DC
  Administered 2019-05-15 (×2): 3 [IU] via SUBCUTANEOUS
  Administered 2019-05-16: 7 [IU] via SUBCUTANEOUS
  Administered 2019-05-16: 4 [IU] via SUBCUTANEOUS
  Administered 2019-05-17: 3 [IU] via SUBCUTANEOUS

## 2019-05-15 MED ORDER — AMIODARONE HCL 200 MG PO TABS
100.0000 mg | ORAL_TABLET | Freq: Every day | ORAL | Status: DC
  Administered 2019-05-15 – 2019-05-17 (×3): 100 mg via ORAL
  Filled 2019-05-15 (×3): qty 1

## 2019-05-15 MED ORDER — FUROSEMIDE 10 MG/ML IJ SOLN
40.0000 mg | Freq: Once | INTRAMUSCULAR | Status: AC
  Administered 2019-05-15: 40 mg via INTRAVENOUS
  Filled 2019-05-15: qty 4

## 2019-05-15 MED ORDER — FLUTICASONE FUROATE-VILANTEROL 100-25 MCG/INH IN AEPB
1.0000 | INHALATION_SPRAY | Freq: Every day | RESPIRATORY_TRACT | Status: DC
  Filled 2019-05-15: qty 28

## 2019-05-15 MED ORDER — HYDROXYUREA 500 MG PO CAPS
1000.0000 mg | ORAL_CAPSULE | Freq: Every day | ORAL | Status: DC
  Administered 2019-05-15: 1000 mg via ORAL
  Filled 2019-05-15 (×2): qty 2

## 2019-05-15 MED ORDER — UMECLIDINIUM BROMIDE 62.5 MCG/INH IN AEPB
1.0000 | INHALATION_SPRAY | Freq: Every day | RESPIRATORY_TRACT | Status: DC
  Filled 2019-05-15: qty 7

## 2019-05-15 MED ORDER — FUROSEMIDE 40 MG PO TABS
40.0000 mg | ORAL_TABLET | Freq: Two times a day (BID) | ORAL | Status: DC
  Administered 2019-05-16 – 2019-05-17 (×3): 40 mg via ORAL
  Filled 2019-05-15 (×4): qty 1

## 2019-05-15 MED ORDER — WARFARIN - PHARMACIST DOSING INPATIENT: Freq: Every day | Status: DC

## 2019-05-15 MED ORDER — CARVEDILOL 3.125 MG PO TABS: 3.1250 mg | ORAL_TABLET | ORAL | Status: DC

## 2019-05-15 NOTE — Progress Notes (Signed)
NAME:  [name], MRN:  [id number], DOB:  03-11-1946, LOS: 2 ADMISSION DATE:  05/13/2019, CONSULTATION DATE:  05/13/2019  REFERRING MD:  Sedonia Small of ER, CHIEF COMPLAINT:  Resp failure   History of present illness    [name] 73 y.o. has a history of multiple admissions for respiratory failure and intubations believe due to combination of COPD exacerbation and chronic acute on systolic heart failure exacerbations.  He presented to the ED with respiratory distress brought in by the EMS.  BiPAP was tried.  He was profoundly hypoxemic he was also obtunded.  Therefore he was intubated.  Postintubation is on propofol with soft blood pressures but not on vasopressors.  According to the nurse in the ER he does move all his 4 extremities and tries to get agitated when sedation weaned.  CT angiogram chest is pending.  COVID-19 test is negative.  His baseline creatinine appears between 1.3 and 1.6 mg percent.  Currently 1.6 mg percent.   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  No mold, hot tub, Jacuzzi, down pillows or comforters Smoking history: 40-60-pack-year smoking history.  Continues to smoke Travel history: Originally from [place].  Previously lived in [place].  No significant recent travel Relevant family history: No significant family history of lung disease  Hospitalized in July and September 2020 with respiratory failure, requiring intubation.  COVID-19 tests were negative.  Treated for pulmonary edema, AECOPD and pneumonitis.  Echocardiogram shows EF 35-40% July 2020 which was thought to be due to nonischemic cardiomyopathy. Symptoms improved with aggressive diuresis and was extubated.  Also seen by cardiology for persistent atrial fibrillation.  Patient is on amiodarone and rate control with Coreg.  Per patient he has been on  amiodarone for at least a year.  Discharged in September 2020 t with Trelegy inhaler.  He was previously not on any inhalers.  PFTs in 2015 showed severe COPD - gold stage 3 fev1 1.3L/47% and DLCO 62% with normal A1AT.  Current symptoms are chronic dyspnea on exertion.  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   reports that he has been smoking cigarettes. He has a 5.00 pack-year smoking history. He has never used smokeless tobacco.  Past Surgical History:  Procedure Laterality Date  . CARDIAC ELECTROPHYSIOLOGY MAPPING AND ABLATION  08/2010   [name] 09/07/2010 (12/14/2012)  . COLONOSCOPY     15-20 years ago had colon in [place]  . EXCISIONAL HEMORRHOIDECTOMY  1970's  . LOOP RECORDER IMPLANT N/A 08/23/2013   Procedure: LOOP RECORDER IMPLANT;  Surgeon: Deboraha Sprang, MD;  Location: [facility] CATH LAB;  Service: Cardiovascular;  Laterality: N/A;  . MULTIPLE EXTRACTIONS WITH ALVEOLOPLASTY Bilateral 01/24/2016   Procedure: MULTIPLE EXTRACTION WITH ALVEOLOPLASTY BILATERAL;  Surgeon: Diona Browner, DDS;  Location: [facility];  Service: Oral Surgery;  Laterality: Bilateral;  . MULTIPLE TOOTH EXTRACTIONS  01/24/2016  MULTIPLE EXTRACTION WITH ALVEOLOPLASTY BILATERAL (Bilateral)    No Known Allergies    Significant Hospital Events   May 13, 2019 admission >>  Consults:  PCCM 05/14/2019  Procedures:  May 13, 2019-intubation  Significant Diagnostic Tests:  May 13, 2019-CT angiogram chest IMPRESSION: 1.  No demonstrable pulmonary embolus.  2. Ascending thoracic diameter measures 4.9 x 4.9 cm. No dissection evident. There are foci of aortic atherosclerosis as well as foci of coronary artery calcification. Ascending thoracic aortic aneurysm. Recommend semi-annual imaging followup by CTA or MRA and referral to cardiothoracic surgery if not already obtained. This recommendation follows 2010 ACCF/AHA/AATS/ACR/ASA/SCA/SCAI/SIR/STS/SVM Guidelines for the Diagnosis and Management of Patients With Thoracic Aortic Disease. Circulation. 2010; 121JN:[id number]. Aortic aneurysm NOS (ICD10-I71.9).  3. Multifocal airspace consolidation, most severe in the lower lobes bilaterally consistent with multifocal pneumonia. Aspiration could present in this manner and may be present concurrently with pneumonia.  4. Endotracheal tube tip is at the origin of the right main bronchus. Advise withdrawing endotracheal tube approximately 3 cm.  5. Right-sided thyroid nodule, essentially stable compared to 2015 study.  6.  Nasogastric tube tip in stomach.  7.  No evident adenopathy.  Aortic aneurysm NOS (ICD10-I71.9).  Critical Value/emergent results were called by telephone at the time of interpretation on 05/13/2019 at 3:05 pm to providerDr. Johnney Killian, ED physician, who verbally acknowledged these results. Micro Data:  May 13, 2019 0 COVID-19L: Negative  Antimicrobials:  Vancomycin May 13, 2019>10/26 Cefepime May 13, 2019 >10/26 Doxycycline 10/26>   Interim history/subjective:  Extubated 10/25.  On room air.  Feeling much better.   Objective   Blood pressure 111/75, pulse 98, temperature 97.7 F (36.5 C), temperature source Axillary, resp. rate 19, height 5\' 6"  (1.676 m), weight 107.1 kg, SpO2 95 %.    FiO2 (%):  [21 %-28 %] 21 %   Intake/Output  Summary (Last 24 hours) at 05/15/2019 0934 Last data filed at 05/15/2019 0700 Gross per 24 hour  Intake 605.84 ml  Output 205 ml  Net 400.84 ml   Filed Weights   05/13/19 0941 05/15/19 0500  Weight: 99.4 kg 107.1 kg    Examination: General: Sitting up in bed. Well-developed, well nourished. NAD HENT: Normocephalic, PERRL. Moist mucus membranes Neck: No JVD. Trachea midline.  CV: RRR. S1S2. No MRG. +2 distal pulses Lungs: BBS present, mild insp/exp wheezing/rhonchi, FNL, symmetrical ABD: Rounded. +BS x4. SNT/ND. No masses, guarding or rigidity GU: No Foley EXT: MAE well. 1+ edema to shine  Skin: PWD. In tact. No rashes or lesions Neuro: A&Ox3. CN II-XII in tact. No focal deficits Psych: Appropriate mood, insight and judgment for time and situation   Lab data reviewed in EMR. Resolved Hospital Problem list    Assessment & Plan:  Acute hypoxemic respiratory failure-extubated 10/25 -on room air -pulm hygiene  -OOB   Stage III COPD Acute exacerbation of COPD -Continue bronchodilators-DuoNeb -Steroids-Solu-Medrol 40 q. 12 x one more day then prednisone 40mg  QD then taper as appropriate -restart home Trelegy formulary equivalent -Has a history of noncompliance  Bibasal pneumonia-not overly impressive.  Afebrile.  No leukocytosis. Pro-Cal slightly elevated at 0.63 -De-escalate to doxycycline for 5 days coverage  History of systolic heart failure -Diurese with IV Lasix x1 dose today.  Check potassium today. -Restart home dose of Lasix tomorrow -Coreg  Atrial fibrillation -Continue amiodarone -Restart Coumadin per pharmacy consult  Chronic kidney disease -Trend electrolytes, BMP tomorrow  History of polycythemia -On hydroxyurea  Hyperglycemia-no history of diabetes.  Likely secondary to steroids  Last A1c 5.8 -Sliding scale insulin  Best practice:  Diet: Consistent carbohydrate Pain/Anxiety/Delirium protocol (if indicated): NA VAP protocol (if indicated): NA  DVT prophylaxis: coumadin GI prophylaxis: ppi Glucose control: ssi Mobility: Out of bed. activity as tolerated Code Status: full code Family Communication: Will update Disposition: Patient may transfer out of ICU. Will sign out to hospitalist, to assume care 05/16/2019.  PCCM will sign off. Thank you for the opportunity to participate in this patient's care. Please contact if we can be of further assistance.  Francine Graven, MSN, AGACNP  [facility]

## 2019-05-15 NOTE — Progress Notes (Addendum)
ANTICOAGULATION CONSULT NOTE - Initial Consult  Pharmacy Consult for warfarin  Indication: atrial fibrillation  No Known Allergies  Patient Measurements: Height: 5\' 6"  (167.6 cm) Weight: 236 lb 1.8 oz (107.1 kg) IBW/kg (Calculated) : 63.8 Heparin Dosing Weight: 85kg  Vital Signs: BP: 111/75 (10/26 0700) Pulse Rate: 98 (10/26 0700)  Labs: Recent Labs    05/13/19 0952  05/13/19 1714 05/13/19 1811 05/13/19 2241 05/14/19 0405 05/14/19 0502 05/14/19 0607 05/14/19 1228 05/15/19 0436  HGB 16.4   < >  --  14.6  --  11.7* 12.6*  --  13.4  --   HCT 47.8   < >  --  41.1  --  33.7* 37.0*  --  39.0  --   PLT 195  --   --  142*  --  115*  --   --   --   --   LABPROT  --   --   --  24.7*  --  26.2*  --   --   --  22.8*  INR  --   --   --  2.3*  --  2.5*  --   --   --  2.0*  CREATININE 1.62*  --   --  1.40*  --  1.22  --   --   --   --   TROPONINIHS 13   < > 149*  --  114*  --   --  74*  --   --    < > = values in this interval not displayed.    Estimated Creatinine Clearance: 63.7 mL/min (by C-G formula based on SCr of 1.22 mg/dL).   Medical History: Past Medical History:  Diagnosis Date  . Acute lower GI bleeding 02/15/2017  . Atrial fibrillation ([place])   . Cardiomyopathy    EF55% 11/14<<35%   . CHF (congestive heart failure) ([place])   . COPD (chronic obstructive pulmonary disease) ([place])   . Essential hypertension   . GERD (gastroesophageal reflux disease)   . Gout   . Hepatitis    Possible history  . History of atrial flutter    Ablation 2012  . 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 Obesity   . Pneumonia X 1  . Primary polycythemia ([place]) 06/12/2013  . Renal insufficiency   . Substance abuse ([place])    History of alcohol; hx cocaine, heroin, crack use  . Syncope     Medications:  Medications Prior to Admission  Medication Sig Dispense Refill Last Dose  . albuterol (PROAIR HFA) 108 (90 Base) MCG/ACT inhaler Inhale 1-2 puffs into the lungs every 6 (six)  hours as needed for wheezing or shortness of breath. 18 g 3 Past Month at Unknown time  . albuterol (PROVENTIL) (2.5 MG/3ML) 0.083% nebulizer solution Take 3 mLs (2.5 mg total) by nebulization 2 (two) times daily as needed for wheezing or shortness of breath. 75 mL 0 Past Month at Unknown time  . amiodarone (PACERONE) 200 MG tablet TAKE 1/2 TABLET BY MOUTH EVERY DAY (Patient taking differently: Take 100 mg by mouth daily. ) 45 tablet 3 unk at LF 04/18/19 90DS  . camphor-menthol (SARNA) lotion Apply topically 3 (three) times daily. (Patient taking differently: Apply 1 application topically daily as needed for itching. ) 222 mL 0 unk at prn  . carvedilol (COREG) 3.125 MG tablet Take 1 tablet in the morning and 2 tablets in the evening (Patient taking differently: Take 3.125-6.25 mg by mouth See admin instructions. Take 1 table (3.125 mg  totally) by mouth t in the morning and 2 tablets (6.25 mg totally) by mouth in the evening) 270 tablet 2 unk at LF 04/18/19 90DS  . Fluticasone-Umeclidin-Vilant (TRELEGY ELLIPTA) 100-62.5-25 MCG/INH AEPB Inhale 1 puff into the lungs daily. 60 each 5 unk at LF 02/02/19 30DS  . folic acid (FOLVITE) 1 MG tablet Take 1 tablet (1 mg total) by mouth daily. 90 tablet 3 unk at LF 03/07/19 90DS  . furosemide (LASIX) 40 MG tablet Take 1 tablet (40 mg total) by mouth 2 (two) times daily. 60 tablet 0 unk at LF 04/19/19 30DS  . hydroxyurea (HYDREA) 500 MG capsule Take 2 capsules (1,000 mg total) by mouth daily. MAY TAKE WITH FOOD TO MINIMIZE GI SIDE EFFECTS (Patient taking differently: Take 1,000 mg by mouth daily. ) 180 capsule 3 unk at LF 04/18/19 90DS  . SUBOXONE 8-2 MG FILM Place 1 Film under the tongue daily.    unk at [facility] 05/11/19 14DS  . warfarin (COUMADIN) 2.5 MG tablet Take 1 tablet (2.5 mg total) by mouth daily at 6 PM. 30 tablet 2 unk at [facility] 05/12/19 90DS    Assessment: 73 yo male with VDRF on warfarin PTA for afib. Pharmacy consulted to dose warfarin now that patient is  taking po medications. -INR= 2.0 - PTA Warfarin dosing - 2.5 mg po qday  Goal of Therapy:  Goal INR 2 - 3 Monitor platelets by anticoagulation protocol: Yes   Plan:  -Will start warfarin 2.5 mg po x 1 today  -Daily PT/INR  Alanda Slim, PharmD, [facility] [place] Clinical Pharmacist Please see AMION for all Pharmacists' Contact Phone Numbers 05/15/2019, 9:33 AM

## 2019-05-16 ENCOUNTER — Other Ambulatory Visit: Payer: Self-pay

## 2019-05-16 ENCOUNTER — Encounter (HOSPITAL_COMMUNITY): Payer: Self-pay | Admitting: General Practice

## 2019-05-16 ENCOUNTER — Inpatient Hospital Stay: Payer: Medicare Other | Admitting: Hematology and Oncology

## 2019-05-16 ENCOUNTER — Inpatient Hospital Stay: Payer: Medicare Other

## 2019-05-16 DIAGNOSIS — J441 Chronic obstructive pulmonary disease with (acute) exacerbation: Secondary | ICD-10-CM | POA: Diagnosis not present

## 2019-05-16 LAB — BASIC METABOLIC PANEL
Anion gap: 10 (ref 5–15)
BUN: 40 mg/dL — ABNORMAL HIGH (ref 8–23)
CO2: 23 mmol/L (ref 22–32)
Calcium: 8.2 mg/dL — ABNORMAL LOW (ref 8.9–10.3)
Chloride: 104 mmol/L (ref 98–111)
Creatinine, Ser: 1.54 mg/dL — ABNORMAL HIGH (ref 0.61–1.24)
GFR calc Af Amer: 52 mL/min — ABNORMAL LOW (ref >60)
GFR calc non Af Amer: 45 mL/min — ABNORMAL LOW (ref >60)
Glucose, Bld: 171 mg/dL — ABNORMAL HIGH (ref 70–99)
Potassium: 4.2 mmol/L (ref 3.5–5.1)
Sodium: 137 mmol/L (ref 135–145)

## 2019-05-16 LAB — CBC
HCT: 36 % — ABNORMAL LOW (ref 39.0–52.0)
Hemoglobin: 12.5 g/dL — ABNORMAL LOW (ref 13.0–17.0)
MCH: 42.4 pg — ABNORMAL HIGH (ref 26.0–34.0)
MCHC: 34.7 g/dL (ref 30.0–36.0)
MCV: 122 fL — ABNORMAL HIGH (ref 80.0–100.0)
Platelets: 128 10*3/uL — ABNORMAL LOW (ref 150–400)
RBC: 2.95 MIL/uL — ABNORMAL LOW (ref 4.22–5.81)
RDW: 13.8 % (ref 11.5–15.5)
WBC: 10.9 10*3/uL — ABNORMAL HIGH (ref 4.0–10.5)
nRBC: 0 % (ref 0.0–0.2)

## 2019-05-16 LAB — GLUCOSE, CAPILLARY
Glucose-Capillary: 208 mg/dL — ABNORMAL HIGH (ref 70–99)
Glucose-Capillary: 104 mg/dL — ABNORMAL HIGH (ref 70–99)
Glucose-Capillary: 166 mg/dL — ABNORMAL HIGH (ref 70–99)
Glucose-Capillary: 151 mg/dL — ABNORMAL HIGH (ref 70–99)

## 2019-05-16 LAB — PROTIME-INR
INR: 1.7 — ABNORMAL HIGH (ref 0.8–1.2)
Prothrombin Time: 19.7 seconds — ABNORMAL HIGH (ref 11.4–15.2)

## 2019-05-16 MED ORDER — IPRATROPIUM-ALBUTEROL 0.5-2.5 (3) MG/3ML IN SOLN
3.0000 mL | Freq: Two times a day (BID) | RESPIRATORY_TRACT | Status: DC
  Administered 2019-05-16 – 2019-05-17 (×3): 3 mL via RESPIRATORY_TRACT
  Filled 2019-05-16 (×4): qty 3

## 2019-05-16 MED ORDER — WARFARIN SODIUM 5 MG PO TABS
5.0000 mg | ORAL_TABLET | Freq: Once | ORAL | Status: AC
  Administered 2019-05-16: 5 mg via ORAL
  Filled 2019-05-16: qty 1

## 2019-05-16 NOTE — Evaluation (Signed)
Physical Therapy Evaluation Patient Details Name: [name] MRN: [id number] DOB: [birth year] Today's Date: 05/16/2019   History of Present Illness  73yo male with recent history of multiple admissions for respiratory failure and intubations likely driven by COPD and heart failure exacerbations. Now brought to ED via EMS due to respiratory distress, obtunded and hypoxic, intubated 05/13/19 and extubated 05/14/19. Covid negative. PMH syncope, substance abuse, obesity, A-fib and A-flutter, hepatitis, gout, HTN, COPD, CHF, cardiomyopathy  Clinical Impression   Patient received sitting at EOB, very pleasant and willing to participate in PT today. Able to complete functional bed mobility, transfers, and gait approximately 257f with S- full I with no device; HR to 124BPM and SpO2 above 90% with gait on room air, but patient did express shortness of breath with activity. He was left sitting at EOB with all needs met and questions/concerns addressed. He is at baseline level of function and not in need of skilled PT services- PT signing off for now, thank you for the referral!     Follow Up Recommendations No PT follow up    Equipment Recommendations  None recommended by PT    Recommendations for Other Services       Precautions / Restrictions Precautions Precautions: Other (comment) Precaution Comments: watch HR/O2 Restrictions Weight Bearing Restrictions: No      Mobility  Bed Mobility Overal bed mobility: Independent                Transfers Overall transfer level: Independent Equipment used: None             General transfer comment: no physical assist given  Ambulation/Gait Ambulation/Gait assistance: Supervision Gait Distance (Feet): 250 Feet Assistive device: None Gait Pattern/deviations: WFL(Within Functional Limits) Gait velocity: WNL   General Gait Details: gait pattern and speed generally WNL, no unsteadiness or LOB noted, SOB but SpO2 above  90% with gait HR to 124BPM on room air  Stairs            Wheelchair Mobility    Modified Rankin (Stroke Patients Only)       Balance Overall balance assessment: Independent;No apparent balance deficits (not formally assessed)                                           Pertinent Vitals/Pain Pain Assessment: No/denies pain    Home Living Family/patient expects to be discharged to:: Private residence Living Arrangements: Spouse/significant other;Children;Other relatives Available Help at Discharge: Family;Available 24 hours/day Type of Home: House Home Access: Stairs to enter Entrance Stairs-Rails: Can reach both Entrance Stairs-Number of Steps: 3 Home Layout: One level Home Equipment: Cane - single point Additional Comments: uses cane for community distances    Prior Function Level of Independence: Independent         Comments: used a cane at times he reports but not often      Hand Dominance   Dominant Hand: Right    Extremity/Trunk Assessment   Upper Extremity Assessment Upper Extremity Assessment: Overall WFL for tasks assessed    Lower Extremity Assessment Lower Extremity Assessment: Overall WFL for tasks assessed    Cervical / Trunk Assessment Cervical / Trunk Assessment: Normal  Communication   Communication: No difficulties  Cognition Arousal/Alertness: Awake/alert Behavior During Therapy: WFL for tasks assessed/performed Overall Cognitive Status: Within Functional Limits for tasks assessed  General Comments      Exercises     Assessment/Plan    PT Assessment [place] does not need any further PT services  PT Problem List Decreased activity tolerance;Cardiopulmonary status limiting activity;Obesity       PT Treatment Interventions Gait training;Balance training;Neuromuscular re-education;Stair training;Functional mobility training;Patient/family  education;Therapeutic activities;Therapeutic exercise    PT Goals (Current goals can be found in the Care Plan section)  Acute Rehab PT Goals Patient Stated Goal: go home soon PT Goal Formulation: With patient Time For Goal Achievement: 05/30/19 Potential to Achieve Goals: Good    Frequency Other (Comment)(eval only)   Barriers to discharge        Co-evaluation               AM-PAC PT "6 Clicks" Mobility  Outcome Measure Help needed turning from your back to your side while in a flat bed without using bedrails?: None Help needed moving from lying on your back to sitting on the side of a flat bed without using bedrails?: None Help needed moving to and from a bed to a chair (including a wheelchair)?: None Help needed standing up from a chair using your arms (e.g., wheelchair or bedside chair)?: None Help needed to walk in hospital room?: None Help needed climbing 3-5 steps with a railing? : None 6 Click Score: 24    End of Session   Activity Tolerance: Patient tolerated treatment well Patient left: in bed;with call bell/phone within reach(sitting at EOB per his request)   PT Visit Diagnosis: Other (comment)(reduced functional activity tolerance)    Time: 1417-1440 PT Time Calculation (min) (ACUTE ONLY): 23 min   Charges:   PT Evaluation $PT Eval Moderate Complexity: 1 Mod PT Treatments $Gait Training: 8-22 mins       Deniece Ree PT, DPT, CBIS  Supplemental Physical Therapist [place]    Pager [phone] Acute Rehab Office [phone]

## 2019-05-16 NOTE — Plan of Care (Signed)

## 2019-05-16 NOTE — Progress Notes (Signed)
ANTICOAGULATION CONSULT NOTE - Follow Up Consult  Pharmacy Consult for warfarin Indication: atrial fibrillation  No Known Allergies  Patient Measurements: Height: 5\' 6"  (167.6 cm) Weight: 236 lb 1.8 oz (107.1 kg) IBW/kg (Calculated) : 63.8  Vital Signs: Temp: 98 F (36.7 C) (10/27 0519) Temp Source: Oral (10/27 0519) BP: 136/71 (10/27 0519) Pulse Rate: 81 (10/27 0519)  Labs: Recent Labs    05/13/19 1714  05/13/19 1811 05/13/19 2241 05/14/19 0405 05/14/19 0502 05/14/19 [id number] 05/14/19 1228 05/15/19 0436 05/16/19 0245 05/16/19 0808  HGB  --    < > 14.6  --  11.7* 12.6*  --  13.4  --   --  12.5*  HCT  --    < > 41.1  --  33.7* 37.0*  --  39.0  --   --  36.0*  PLT  --   --  142*  --  115*  --   --   --   --   --  128*  LABPROT  --    < > 24.7*  --  26.2*  --   --   --  22.8* 19.7*  --   INR  --    < > 2.3*  --  2.5*  --   --   --  2.0* 1.7*  --   CREATININE  --   --  1.40*  --  1.22  --   --   --   --  1.54*  --   TROPONINIHS 149*  --   --  114*  --   --  74*  --   --   --   --    < > = values in this interval not displayed.    Estimated Creatinine Clearance: 50.5 mL/min (A) (by C-G formula based on SCr of 1.54 mg/dL (H)).   Assessment: Pt is a 25 YOM with respiratory distress on warfarin PTA for afib. Warfarin was resumed 10/26. INR 2.0>1.7, subtherapeutic s/p warfarin 2.5 mg x1 Hgb 12.5, pltc 128  Goal of Therapy:  INR 2-3 Monitor platelets by anticoagulation protocol: Yes   Plan:  Give warfarin 5 mg PO x1 today Daily PT/INR Consider discontinuing Duoneb (duplicate therapy)  Gaylan Gerold 05/16/2019,1:00 PM

## 2019-05-16 NOTE — Progress Notes (Signed)
Patient arrived to 5W. Alert and oriented x 4. Patient oriented to floor, call light within reach, bed in lowest position. All questions answered.

## 2019-05-16 NOTE — Progress Notes (Signed)
PROGRESS NOTE    [name]  [id number] DOB: [birth year] DOA: 05/13/2019 PCP: Marianna Payment, MD   Brief Narrative: 73 year old with past medical history significant for multiple admissions for respiratory failure and intubation thought to be related to COPD exacerbation and acute on chronic systolic and diastolic heart failure exacerbation.  He presented to the ED with respiratory distress brought in by EMS.  BiPAP was tried.  He was profoundly hypoxic he was also obtunded.  Therefore he was intubated.  His blood pressure was transiently soft post intubation on propofol but he did not require vasopressors Covid test was negative.  CT angio was negative for PE did show ascending thoracic diameter measured 4.9 x 4.9 no dissection.  He will need follow-up.  Patient is a current smoker, counseling was provided.  Of note patient recently hospitalization in July on September 2020 with respiratory failure requiring intubation.  Today for pulmonary edema and COPD and pneumonitis.  Echo during that admission showed ejection fraction 35 to 40%.  She was evaluated at that time by cardiology for persistent A. fib and he was a started on amiodarone and rate control Coreg.  Discharge September 2020 on Trelegy inhaler.  Pulmonary function test in 2015 shows severe COPD Gold stage III.  He also has a history of polycythemia with JAK2 mutation and is on hydroxyurea.  Patient was intubated on 10/24 and subsequently extubated on 10/25.  He was treated with IV Lexis nebulizer treatment and IV antibiotics.  His [facility].  His respiratory status stable, his he still have bilateral wheezing we will continue with IV Solu-Medrol today.     Assessment & Plan:   Active Problems:   Acute respiratory failure with hypoxia (HCC)  1-Acute hypoxic respiratory failure; secondary to COPD exacerbation, pneumonia, and acute systolic heart failure exacerbation as well. -He was intubated  on admission 05/09/2019 subsequently extubated 05/14/2019 -Currently on oral Lasix and oral antibiotics. -Treated  with IV Solu-Medrol and transition to oral today.  -Continue with nebulizer treatments.   2-Stage III COPD with acute exacerbation: Continue with DuoNeb. Treated with Solu-Medrol IV, plan to start oral today./  On Incruse Ellipta, Breo.  Resume trelegy t discharge   3-Bibasal pneumonia: Antibiotics was deescalated to doxycycline for 5 days coverage.  4-Acute systolic heart failure exacerbation:; Received 1 dose of IV Lasix. Resume home order Lexis today. Continue with Coreg.  5-A. fib: Continue with amiodarone and Coreg.  Coumadin per pharmacy  6-History of polycythemia: Continue with hydroxyurea 7-Hyperglycemia: Related to the steroids.  Continue with a sliding scale insulin.  Estimated body mass index is 38.11 kg/m as calculated from the following:   Height as of this encounter: 5\' 6"  (1.676 m).   Weight as of this encounter: 107.1 kg.   DVT prophylaxis: Coumadin Code Status: Full code Family Communication: Care discussed with patient Disposition Plan: Remain in the hospital to monitor respiratory status discharge in 1 or 2 days.  PT eval Consultants:   CCM  Procedures:   Intubation 10- 24 extubation 10/25  Antimicrobials:  Doxycycline  Subjective: He report he is breathing better, not at baseline yet.  He still is smoking 1 or 2 cigarettes/day.  Counseling provided.  Advised low-sodium diet and fluid restriction.  Objective: Vitals:   05/15/19 2043 05/15/19 2100 05/15/19 2113 05/16/19 0519  BP:   133/68 136/71  Pulse: 95 85 (!) 102 81  Resp: 20 (!) 22 20 19   Temp:   97.9 F (36.6 C)  98 F (36.7 C)  TempSrc:   Oral Oral  SpO2: 100% 98% 100% 94%  Weight:      Height:        Intake/Output Summary (Last 24 hours) at 05/16/2019 0745 Last data filed at 05/16/2019 0741 Gross per 24 hour  Intake 120 ml  Output 1525 ml  Net -1405 ml    Filed Weights   05/13/19 0941 05/15/19 0500  Weight: 99.4 kg 107.1 kg    Examination:  General exam: Appears calm and comfortable  Respiratory system: Bilateral ronchus.  Cardiovascular system: S1 & S2 heard, RRR. No JVD, murmurs, rubs, gallops or clicks. No pedal edema. Gastrointestinal system: Abdomen is nondistended, soft and nontender. No organomegaly or masses felt. Normal bowel sounds heard. Central nervous system: Alert and oriented. No focal neurological deficits. Extremities: Symmetric 5 x 5 power. Skin: No rashes, lesions or ulcers Psychiatry: Judgement and insight appear normal. Mood & affect appropriate.     Data Reviewed: I have personally reviewed following labs and imaging studies  CBC: Recent Labs  Lab 05/13/19 0952 05/13/19 1137 05/13/19 1811 05/14/19 0405 05/14/19 0502 05/14/19 1228  WBC 10.4  --  4.6 7.7  --   --   NEUTROABS  --   --  4.0  --   --   --   HGB 16.4 13.9 14.6 11.7* 12.6* 13.4  HCT 47.8 41.0 41.1 33.7* 37.0* 39.0  MCV 126.5*  --  121.2* 123.0*  --   --   PLT 195  --  142* 115*  --   --    Basic Metabolic Panel: Recent Labs  Lab 05/13/19 0952 05/13/19 1137 05/13/19 1811 05/14/19 0405 05/14/19 0502 05/15/19 1114 05/16/19 0245  NA 143 142 142 142 143  --  137  K 4.2 4.1 4.4 4.0 4.3 4.5 4.2  CL 107  --  110 114*  --   --  104  CO2 19*  --  21* 20*  --   --  23  GLUCOSE 262*  --  138* 145*  --   --  171*  BUN 20  --  23 23  --   --  40*  CREATININE 1.62*  --  1.40* 1.22  --   --  1.54*  CALCIUM 8.8*  --  8.2* 7.0*  --   --  8.2*  MG  --   --  2.2 1.7  --   --   --   PHOS  --   --   --  2.5  --   --   --    GFR: Estimated Creatinine Clearance: 50.5 mL/min (A) (by C-G formula based on SCr of 1.54 mg/dL (H)). Liver Function Tests: Recent Labs  Lab 05/13/19 0952 05/13/19 1811  AST 80* 50*  ALT 32 34  ALKPHOS 150* 122  BILITOT 0.5 0.8  PROT 6.9 6.0*  ALBUMIN 3.4* 3.0*   Recent Labs  Lab 05/13/19 1811  LIPASE 23  AMYLASE  132*   No results for input(s): AMMONIA in the last 168 hours. Coagulation Profile: Recent Labs  Lab 05/13/19 1811 05/14/19 0405 05/15/19 0436 05/16/19 0245  INR 2.3* 2.5* 2.0* 1.7*   Cardiac Enzymes: No results for input(s): CKTOTAL, CKMB, CKMBINDEX, TROPONINI in the last 168 hours. BNP (last 3 results) No results for input(s): PROBNP in the last 8760 hours. HbA1C: No results for input(s): HGBA1C in the last 72 hours. CBG: Recent Labs  Lab 05/15/19 0443 05/15/19 0743 05/15/19 1128 05/15/19 1613 05/15/19  [place]   Lipid Profile: Recent Labs    05/14/19 0405 05/15/19 0436  TRIG 155* 127   Thyroid Function Tests: No results for input(s): TSH, T4TOTAL, FREET4, T3FREE, THYROIDAB in the last 72 hours. Anemia Panel: No results for input(s): VITAMINB12, FOLATE, FERRITIN, TIBC, IRON, RETICCTPCT in the last 72 hours. Sepsis Labs: Recent Labs  Lab 05/13/19 1811 05/13/19 2139 05/14/19 0113 05/14/19 0405 05/14/19 0607  PROCALCITON 0.63  --   --   --   --   LATICACIDVEN 2.2* 2.7* 3.1* 1.6 1.7    Recent Results (from the past 240 hour(s))  Culture, blood (single) w Reflex to ID Panel     Status: None (Preliminary result)   Collection Time: 05/13/19  9:52 AM   Specimen: BLOOD RIGHT WRIST  Result Value Ref Range Status   Specimen Description BLOOD RIGHT WRIST  Final   Special Requests   Final    BOTTLES DRAWN AEROBIC AND ANAEROBIC Blood Culture adequate volume   Culture   Final    NO GROWTH 2 DAYS Performed at [facility], [address] [place], [place] [address]    Report Status PENDING  Incomplete  SARS Coronavirus 2 by RT PCR (hospital order, performed in [facility] hospital lab) Nasopharyngeal Nasopharyngeal Swab     Status: None   Collection Time: 05/13/19  9:52 AM   Specimen: Nasopharyngeal Swab  Result Value Ref Range Status   SARS Coronavirus 2 NEGATIVE NEGATIVE Final    Comment: (NOTE) If result is NEGATIVE  SARS-CoV-2 target nucleic acids are NOT DETECTED. The SARS-CoV-2 RNA is generally detectable in upper and lower  respiratory specimens during the acute phase of infection. The lowest  concentration of SARS-CoV-2 viral copies this assay can detect is 250  copies / mL. A negative result does not preclude SARS-CoV-2 infection  and should not be used as the sole basis for treatment or other  patient management decisions.  A negative result may occur with  improper specimen collection / handling, submission of specimen other  than nasopharyngeal swab, presence of viral mutation(s) within the  areas targeted by this assay, and inadequate number of viral copies  (<250 copies / mL). A negative result must be combined with clinical  observations, patient history, and epidemiological information. If result is POSITIVE SARS-CoV-2 target nucleic acids are DETECTED. The SARS-CoV-2 RNA is generally detectable in upper and lower  respiratory specimens dur ing the acute phase of infection.  Positive  results are indicative of active infection with SARS-CoV-2.  Clinical  correlation with patient history and other diagnostic information is  necessary to determine patient infection status.  Positive results do  not rule out bacterial infection or co-infection with other viruses. If result is PRESUMPTIVE POSTIVE SARS-CoV-2 nucleic acids MAY BE PRESENT.   A presumptive positive result was obtained on the submitted specimen  and confirmed on repeat testing.  While 2019 novel coronavirus  (SARS-CoV-2) nucleic acids may be present in the submitted sample  additional confirmatory testing may be necessary for epidemiological  and / or clinical management purposes  to differentiate between  SARS-CoV-2 and other Sarbecovirus currently known to infect humans.  If clinically indicated additional testing with an alternate test  methodology [phone]) is advised. The SARS-CoV-2 RNA is generally  detectable in upper  and lower respiratory sp ecimens during the acute  phase of infection. The expected result is Negative. Fact Sheet for Patients:  StrictlyIdeas.no Fact Sheet for Healthcare  Providers: BankingDealers.co.za This test is not yet approved or cleared by the [place] and has been authorized for detection and/or diagnosis of SARS-CoV-2 by FDA under an Emergency Use Authorization (EUA).  This EUA will remain in effect (meaning this test can be used) for the duration of the COVID-19 declaration under Section 564(b)(1) of the Act, 21 U.S.C. section 360bbb-3(b)(1), unless the authorization is terminated or revoked sooner. Performed at [facility], [place] [address], [place], [place] [address]   Respiratory Panel by PCR     Status: None   Collection Time: 05/13/19  2:18 PM   Specimen: Nasopharyngeal Swab; Respiratory  Result Value Ref Range Status   Adenovirus NOT DETECTED NOT DETECTED Final   Coronavirus 229E NOT DETECTED NOT DETECTED Final    Comment: (NOTE) The Coronavirus on the Respiratory Panel, DOES NOT test for the novel  Coronavirus (2019 nCoV)    Coronavirus HKU1 NOT DETECTED NOT DETECTED Final   Coronavirus NL63 NOT DETECTED NOT DETECTED Final   Coronavirus OC43 NOT DETECTED NOT DETECTED Final   Metapneumovirus NOT DETECTED NOT DETECTED Final   Rhinovirus / Enterovirus NOT DETECTED NOT DETECTED Final   Influenza A NOT DETECTED NOT DETECTED Final   Influenza B NOT DETECTED NOT DETECTED Final   Parainfluenza Virus 1 NOT DETECTED NOT DETECTED Final   Parainfluenza Virus 2 NOT DETECTED NOT DETECTED Final   Parainfluenza Virus 3 NOT DETECTED NOT DETECTED Final   Parainfluenza Virus 4 NOT DETECTED NOT DETECTED Final   Respiratory Syncytial Virus NOT DETECTED NOT DETECTED Final   Bordetella pertussis NOT DETECTED NOT DETECTED Final   Chlamydophila pneumoniae NOT DETECTED NOT DETECTED Final   Mycoplasma pneumoniae NOT DETECTED  NOT DETECTED Final    Comment: Performed at [facility], [place] [place], [place] [address]  MRSA PCR Screening     Status: None   Collection Time: 05/13/19  2:18 PM  Result Value Ref Range Status   MRSA by PCR NEGATIVE NEGATIVE Final    Comment:        The GeneXpert MRSA Assay (FDA approved for NASAL specimens only), is one component of a comprehensive MRSA colonization surveillance program. It is not intended to diagnose MRSA infection nor to guide or monitor treatment for MRSA infections. Performed at [facility], [place] [address] [place], [place] [address]   Urine culture     Status: None   Collection Time: 05/13/19  2:30 PM   Specimen: Urine, Random  Result Value Ref Range Status   Specimen Description URINE, RANDOM  Final   Special Requests NONE  Final   Culture   Final    NO GROWTH Performed at [facility], [place] [address] [place], [place] [address]    Report Status 05/14/2019 FINAL  Final  Culture, blood (routine x 2)     Status: None (Preliminary result)   Collection Time: 05/13/19  6:05 PM   Specimen: BLOOD  Result Value Ref Range Status   Specimen Description BLOOD LEFT ANTECUBITAL  Final   Special Requests   Final    BOTTLES DRAWN AEROBIC AND ANAEROBIC Blood Culture results may not be optimal due to an inadequate volume of blood received in culture bottles   Culture   Final    NO GROWTH 2 DAYS Performed at [facility], [place] [address], [place], [place] [address]    Report Status PENDING  Incomplete  Culture, blood (routine x 2)  Status: None (Preliminary result)   Collection Time: 05/13/19  6:11 PM   Specimen: BLOOD LEFT ARM  Result Value Ref Range Status   Specimen Description BLOOD LEFT ARM UPPER  Final   Special Requests   Final    BOTTLES DRAWN AEROBIC AND ANAEROBIC Blood Culture results may not be optimal due to an inadequate volume of blood received in culture bottles   Culture   Final    NO GROWTH 2 DAYS  Performed at [facility], [place] [address] [place], [place] [address]    Report Status PENDING  Incomplete         Radiology Studies: No results found.      Scheduled Meds: . amiodarone  100 mg Oral Daily  . buprenorphine  8 mg Sublingual Daily  . carvedilol  3.125-6.25 mg Oral See admin instructions  . Chlorhexidine Gluconate Cloth  6 each Topical Daily  . doxycycline  100 mg Oral Q12H  . fluticasone furoate-vilanterol  1 puff Inhalation Daily  . furosemide  40 mg Oral BID  . hydroxyurea  1,000 mg Oral Daily  . insulin aspart  0-20 Units Subcutaneous TID WC  . ipratropium-albuterol  3 mL Nebulization BID  . mouth rinse  15 mL Mouth Rinse BID  . methylPREDNISolone (SOLU-MEDROL) injection  40 mg Intravenous Q12H  . predniSONE  10 mg Oral Q breakfast  . predniSONE  40 mg Oral Q breakfast  . sodium chloride flush  3 mL Intravenous Once  . umeclidinium bromide  1 puff Inhalation Daily  . Warfarin - Pharmacist Dosing Inpatient   Does not apply q1800   Continuous Infusions:   LOS: 3 days    Time spent: 35 minutes.     Elmarie Shiley, MD [facility] Pager [phone]  If 7PM-7AM, please contact night-coverage [email or web address] Password [email or web address] 05/16/2019, 7:45 AM

## 2019-05-17 ENCOUNTER — Encounter: Payer: Medicare Other | Admitting: Acute Care

## 2019-05-17 ENCOUNTER — Inpatient Hospital Stay: Admission: RE | Admit: 2019-05-17 | Payer: Medicare Other | Source: Ambulatory Visit

## 2019-05-17 DIAGNOSIS — J9601 Acute respiratory failure with hypoxia: Secondary | ICD-10-CM | POA: Diagnosis not present

## 2019-05-17 LAB — PROTIME-INR
INR: 1.7 — ABNORMAL HIGH (ref 0.8–1.2)
Prothrombin Time: 19.7 seconds — ABNORMAL HIGH (ref 11.4–15.2)

## 2019-05-17 LAB — GLUCOSE, CAPILLARY
Glucose-Capillary: 108 mg/dL — ABNORMAL HIGH (ref 70–99)
Glucose-Capillary: 135 mg/dL — ABNORMAL HIGH (ref 70–99)

## 2019-05-17 MED ORDER — ENOXAPARIN SODIUM 150 MG/ML ~~LOC~~ SOLN
150.0000 mg | SUBCUTANEOUS | Status: AC
  Administered 2019-05-17: 150 mg via SUBCUTANEOUS
  Filled 2019-05-17: qty 1

## 2019-05-17 MED ORDER — DOXYCYCLINE HYCLATE 100 MG PO TABS: 100.0000 mg | ORAL_TABLET | Freq: Two times a day (BID) | ORAL | 0 refills | Status: AC

## 2019-05-17 MED ORDER — PREDNISONE 20 MG PO TABS: 40.0000 mg | ORAL_TABLET | Freq: Every day | ORAL | 0 refills | Status: AC

## 2019-05-17 MED ORDER — WARFARIN SODIUM 5 MG PO TABS
5.0000 mg | ORAL_TABLET | ORAL | Status: AC
  Administered 2019-05-17: 5 mg via ORAL
  Filled 2019-05-17: qty 1

## 2019-05-17 MED ORDER — WARFARIN SODIUM 5 MG PO TABS: 5.0000 mg | ORAL_TABLET | Freq: Once | ORAL | Status: DC

## 2019-05-17 NOTE — Care Management Important Message (Signed)
Important Message  Patient Details  Name: [name] MRN: [id number] Date of Birth: Sep 29, 1945   Medicare Important Message Given:  Yes     Orbie Pyo 05/17/2019, 10:59 AM

## 2019-05-17 NOTE — Care Management (Signed)
CM spoke with pt via phone prior to discharge.  Pt states he is independent from home with wife.  Pt denied barriers with NC360 parameters.  Pt confirms his triology is working in the home, pt informed CM that he doesn't have nor need any other equipment in the home.  Pt will transport home via private vehicle. High risk assessment complete - pt declined HHRN for disease management.  Pt also declined for CM to arrange follow up appt with PCP.   NO CM needs determined - CM signing off

## 2019-05-17 NOTE — Plan of Care (Signed)
Pt was given discharge information, all questions answered.

## 2019-05-17 NOTE — Progress Notes (Signed)
ANTICOAGULATION CONSULT NOTE - Follow Up Consult  Pharmacy Consult for warfarin Indication: atrial fibrillation  No Known Allergies  Patient Measurements: Height: 5\' 6"  (167.6 cm) Weight: 232 lb 9.4 oz (105.5 kg) IBW/kg (Calculated) : 63.8  Vital Signs: Temp: 97.9 F (36.6 C) (10/28 0611) Temp Source: Oral (10/28 0611) BP: 130/82 (10/28 0611) Pulse Rate: 98 (10/28 0611)  Labs: Recent Labs    05/14/19 1228 05/15/19 0436 05/16/19 0245 05/16/19 0808 05/17/19 0214  HGB 13.4  --   --  12.5*  --   HCT 39.0  --   --  36.0*  --   PLT  --   --   --  128*  --   LABPROT  --  22.8* 19.7*  --  19.7*  INR  --  2.0* 1.7*  --  1.7*  CREATININE  --   --  1.54*  --   --     Estimated Creatinine Clearance: 50.1 mL/min (A) (by C-G formula based on SCr of 1.54 mg/dL (H)).   Assessment: Pt is a 24 YOM with respiratory distress on warfarin PTA for afib. Warfarin was resumed 10/26. INR this morning 1.7, subtherapeutic Hgb 12.5, pltc 128  Goal of Therapy:  INR 2-3 Monitor platelets by anticoagulation protocol: Yes   Plan:  Give warfarin 5 mg PO x1 today Daily PT/INR Monitor platelets, bleeding  Gaylan Gerold 05/17/2019,8:52 AM

## 2019-05-17 NOTE — Evaluation (Signed)
Occupational Therapy Evaluation Patient Details Name: [name] MRN: [id number] DOB: 09-Oct-1945 Today's Date: 05/17/2019    History of Present Illness 73yo male with recent history of multiple admissions for respiratory failure and intubations likely driven by COPD and heart failure exacerbations. Now brought to ED via EMS due to respiratory distress, obtunded and hypoxic, intubated 05/13/19 and extubated 05/14/19. Covid negative. PMH syncope, substance abuse, obesity, A-fib and A-flutter, hepatitis, gout, HTN, COPD, CHF, cardiomyopathy   Clinical Impression   Pt is a 73 year old male who lives with his family in a single level home. Pt at baseline is independent with self care tasks and demonstrated during OT evaluation no skilled therapy indicated at this time.     Follow Up Recommendations  No OT follow up;Supervision/Assistance - 24 hour    Equipment Recommendations  None recommended by OT       Precautions / Restrictions Precautions Precautions: Other (comment)(monitor O2 saturations) Restrictions Weight Bearing Restrictions: No      Mobility                  Transfers Overall transfer level: Independent Equipment used: None                  Balance Overall balance assessment: Independent;No apparent balance deficits (not formally assessed)                                         ADL either performed or assessed with clinical judgement   ADL Overall ADL's : At baseline                                                          Pertinent Vitals/Pain Pain Assessment: No/denies pain     Hand Dominance Right   Extremity/Trunk Assessment Upper Extremity Assessment Upper Extremity Assessment: Overall WFL for tasks assessed   Lower Extremity Assessment Lower Extremity Assessment: Defer to PT evaluation   Cervical / Trunk Assessment Cervical / Trunk Assessment: Normal   Communication  Communication Communication: No difficulties   Cognition Arousal/Alertness: Awake/alert Behavior During Therapy: WFL for tasks assessed/performed Overall Cognitive Status: Within Functional Limits for tasks assessed                                                Home Living Family/patient expects to be discharged to:: Private residence Living Arrangements: Spouse/significant other;Children;Other relatives Available Help at Discharge: Family;Available 24 hours/day Type of Home: House Home Access: Stairs to enter [employer] of Steps: 3 Entrance Stairs-Rails: Can reach both Home Layout: One level     Bathroom Shower/Tub: Teacher, early years/pre: Standard Bathroom Accessibility: Yes How Accessible: Accessible via walker Home Equipment: [place] - single point   Additional Comments: uses cane for community distances      Prior Functioning/Environment Level of Independence: Independent        Comments: pt reports using cane for community distances "for my back"        OT Problem List: Cardiopulmonary status limiting activity      OT Treatment/Interventions:  OT Goals(Current goals can be found in the care plan section) Acute Rehab OT Goals Patient Stated Goal: to go home OT Goal Formulation: All assessment and education complete, DC therapy   AM-PAC OT "6 Clicks" Daily Activity     Outcome Measure Help from another person eating meals?: None Help from another person taking care of personal grooming?: None Help from another person toileting, which includes using toliet, bedpan, or urinal?: None Help from another person bathing (including washing, rinsing, drying)?: None Help from another person to put on and taking off regular upper body clothing?: None Help from another person to put on and taking off regular lower body clothing?: None 6 Click Score: 24   End of Session    Activity Tolerance: Patient tolerated treatment  well Patient left: in bed;with call bell/phone within reach  OT Visit Diagnosis: Other abnormalities of gait and mobility (R26.89)                Time: [id number] OT Time Calculation (min): 15 min Charges:  OT General Charges $OT Visit: 1 Visit OT Evaluation $OT Eval Low Complexity: 1 Low  Delbert Phenix OT OT office: [phone]  Rosemary Holms 05/17/2019, 9:27 AM

## 2019-05-17 NOTE — Discharge Instructions (Signed)

## 2019-05-17 NOTE — Discharge Summary (Addendum)
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 repeat testing.  While 2019 novel coronavirus  (SARS-CoV-2) nucleic acids may be present in the submitted sample  additional confirmatory testing may be necessary for epidemiological  and / or clinical management purposes  to differentiate between  SARS-CoV-2 and other Sarbecovirus currently known to infect humans.  If clinically indicated additional testing with an alternate test  methodology [phone]) is advised. The SARS-CoV-2 RNA is generally  detectable in upper and lower respiratory sp ecimens during the acute  phase of infection. The expected result is Negative. Fact Sheet for Patients:  StrictlyIdeas.no Fact Sheet for Healthcare  Providers: BankingDealers.co.za This test is not yet approved or cleared by the [place] FDA and has been authorized for detection and/or diagnosis of SARS-CoV-2 by FDA under an Emergency Use Authorization (EUA).  This EUA will remain in effect (meaning this test can be used) for the duration of the COVID-19 declaration under Section 564(b)(1) of the Act, 21 U.S.C. section 360bbb-3(b)(1), unless the authorization is terminated or revoked sooner. Performed at [facility], [place] [address], [place], [place] [address]   Respiratory Panel by PCR     Status: None   Collection Time: 05/13/19  2:18 PM   Specimen: Nasopharyngeal Swab; Respiratory  Result Value Ref Range Status   Adenovirus NOT DETECTED NOT DETECTED Final   Coronavirus 229E NOT DETECTED NOT DETECTED Final    Comment: (NOTE) The Coronavirus on the Respiratory Panel, DOES NOT test for the novel  Coronavirus (2019 nCoV)    Coronavirus HKU1 NOT DETECTED NOT DETECTED Final   Coronavirus NL63 NOT DETECTED NOT DETECTED Final   Coronavirus OC43 NOT DETECTED NOT DETECTED Final   Metapneumovirus NOT DETECTED NOT DETECTED Final   Rhinovirus / Enterovirus NOT DETECTED NOT DETECTED Final   Influenza A NOT DETECTED NOT DETECTED Final   Influenza B NOT DETECTED NOT DETECTED Final   Parainfluenza Virus 1 NOT DETECTED NOT DETECTED Final   Parainfluenza Virus 2 NOT DETECTED NOT DETECTED Final   Parainfluenza Virus 3 NOT DETECTED NOT DETECTED Final   Parainfluenza Virus 4 NOT DETECTED NOT DETECTED Final   Respiratory Syncytial Virus NOT DETECTED NOT DETECTED Final   Bordetella pertussis NOT DETECTED NOT DETECTED Final   Chlamydophila pneumoniae NOT DETECTED NOT DETECTED Final   Mycoplasma pneumoniae NOT DETECTED NOT DETECTED Final    Comment: Performed at [facility], [place]. [address], [place], [place] [address]  MRSA PCR Screening     Status: None   Collection Time: 05/13/19  2:18 PM  Result Value Ref  Range Status   MRSA by PCR NEGATIVE NEGATIVE Final    Comment:        The GeneXpert MRSA Assay (FDA approved for NASAL specimens only), is one component of a comprehensive  MRSA colonization surveillance program. It is not intended to diagnose MRSA infection nor to guide or monitor treatment for MRSA infections. Performed at [facility], [place] [address] [place], [place] [address]   Urine culture     Status: None   Collection Time: 05/13/19  2:30 PM   Specimen: Urine, Random  Result Value Ref Range Status   Specimen Description URINE, RANDOM  Final   Special Requests NONE  Final   Culture   Final    NO GROWTH Performed at [facility], [place] [address], [place], [place] [address]    Report Status 05/14/2019 FINAL  Final  Culture, blood (routine x 2)     Status: None (Preliminary result)   Collection Time: 05/13/19  6:05 PM   Specimen: BLOOD  Result Value Ref Range Status   Specimen Description BLOOD LEFT ANTECUBITAL  Final   Special Requests   Final    BOTTLES DRAWN AEROBIC AND ANAEROBIC Blood Culture results may not be optimal due to an inadequate volume of blood received in culture bottles   Culture   Final    NO GROWTH 4 DAYS Performed at [facility], [place] [address] [place], [place] [address]    Report Status PENDING  Incomplete  Culture, blood (routine x 2)     Status: None (Preliminary result)   Collection Time: 05/13/19  6:11 PM   Specimen: BLOOD LEFT ARM  Result Value Ref Range Status   Specimen Description BLOOD LEFT ARM UPPER  Final   Special Requests   Final    BOTTLES DRAWN AEROBIC AND ANAEROBIC Blood Culture results may not be optimal due to an inadequate volume of blood received in culture bottles   Culture   Final    NO GROWTH 4 DAYS Performed at [facility], [place] [address] [place], [place] [address]    Report Status PENDING  Incomplete     Time coordinating discharge: 35 minutes  SIGNED:   Jonnie Finner, DO  [facility] 05/17/2019, 12:13 PM Pager   If 7PM-7AM, please contact night-coverage [email or web address] Password [email or web address]

## 2019-05-18 ENCOUNTER — Telehealth: Payer: Self-pay | Admitting: Hematology and Oncology

## 2019-05-18 LAB — CULTURE, BLOOD (ROUTINE X 2)
Culture: NO GROWTH
Culture: NO GROWTH

## 2019-05-18 LAB — CULTURE, BLOOD (SINGLE)
Culture: NO GROWTH
Special Requests: ADEQUATE

## 2019-05-18 NOTE — Telephone Encounter (Signed)
Scheduled appt per 10/29 sch message - spoke with pt - they are aware of appt added - mailed reminder letter

## 2019-05-29 ENCOUNTER — Other Ambulatory Visit: Payer: Self-pay

## 2019-05-29 ENCOUNTER — Ambulatory Visit (INDEPENDENT_AMBULATORY_CARE_PROVIDER_SITE_OTHER): Payer: Medicare Other

## 2019-05-29 DIAGNOSIS — I4891 Unspecified atrial fibrillation: Secondary | ICD-10-CM | POA: Diagnosis not present

## 2019-05-29 DIAGNOSIS — Z5181 Encounter for therapeutic drug level monitoring: Secondary | ICD-10-CM

## 2019-05-29 DIAGNOSIS — Z7901 Long term (current) use of anticoagulants: Secondary | ICD-10-CM | POA: Diagnosis present

## 2019-05-29 LAB — POCT INR: INR: 2.3 (ref 2.0–3.0)

## 2019-05-29 NOTE — Patient Instructions (Signed)
Patient instructed to take medications as defined in the Anti-coagulation Track section of this encounter.  Patient instructed to take today's dose.  Patient instructed to take one (1) tablet of your 2.5mg  green-colored warfarin tablets every day of the week.   Patient verbalized understanding of these instructions.

## 2019-05-29 NOTE — Progress Notes (Signed)
Anticoagulation Management [name] is a 73 y.o. male who reports to the clinic for monitoring of warfarin treatment.    Indication: atrial fibrillation  Duration: indefinite Supervising physician: Gilles Chiquito  Anticoagulation Clinic Visit History: Patient does not report signs/symptoms of bleeding or thromboembolism  Other recent changes: No changed in diet, medications, lifestyle Anticoagulation Episode Summary    Current INR goal:  2.0-3.0  TTR:  67.3 % (6.3 y)  Next INR check:  06/19/2019  INR from last check:  2.3 (05/29/2019)  Weekly max warfarin dose:    Target end date:  Indefinite  INR check location:  Anticoagulation Clinic  Preferred lab:    Send INR reminders to:     Indications   Atrial fibrillation (HCC) [I48.91]       Comments:          No Known Allergies  Current Outpatient Medications:  .  albuterol (PROAIR HFA) 108 (90 Base) MCG/ACT inhaler, Inhale 1-2 puffs into the lungs every 6 (six) hours as needed for wheezing or shortness of breath., Disp: 18 g, Rfl: 3 .  albuterol (PROVENTIL) (2.5 MG/3ML) 0.083% nebulizer solution, Take 3 mLs (2.5 mg total) by nebulization 2 (two) times daily as needed for wheezing or shortness of breath., Disp: 75 mL, Rfl: 0 .  amiodarone (PACERONE) 200 MG tablet, TAKE 1/2 TABLET BY MOUTH EVERY DAY (Patient taking differently: Take 100 mg by mouth daily. ), Disp: 45 tablet, Rfl: 3 .  camphor-menthol (SARNA) lotion, Apply topically 3 (three) times daily. (Patient taking differently: Apply 1 application topically daily as needed for itching. ), Disp: 222 mL, Rfl: 0 .  carvedilol (COREG) 3.125 MG tablet, Take 1 tablet in the morning and 2 tablets in the evening (Patient taking differently: Take 3.125-6.25 mg by mouth See admin instructions. Take 1 table (3.125 mg totally) by mouth t in the morning and 2 tablets (6.25 mg totally) by mouth in the evening), Disp: 270 tablet, Rfl: 2 .  Fluticasone-Umeclidin-Vilant (TRELEGY  ELLIPTA) 100-62.5-25 MCG/INH AEPB, Inhale 1 puff into the lungs daily., Disp: 60 each, Rfl: 5 .  folic acid (FOLVITE) 1 MG tablet, Take 1 tablet (1 mg total) by mouth daily., Disp: 90 tablet, Rfl: 3 .  furosemide (LASIX) 40 MG tablet, Take 1 tablet (40 mg total) by mouth 2 (two) times daily., Disp: 60 tablet, Rfl: 0 .  hydroxyurea (HYDREA) 500 MG capsule, Take 2 capsules (1,000 mg total) by mouth daily. MAY TAKE WITH FOOD TO MINIMIZE GI SIDE EFFECTS (Patient taking differently: Take 1,000 mg by mouth daily. ), Disp: 180 capsule, Rfl: 3 .  SUBOXONE 8-2 MG FILM, Place 1 Film under the tongue daily. , Disp: , Rfl:  .  warfarin (COUMADIN) 2.5 MG tablet, Take 1 tablet (2.5 mg total) by mouth daily at 6 PM., Disp: 30 tablet, Rfl: 2 Past Medical History:  Diagnosis Date  . Acute lower GI bleeding 02/15/2017  . Atrial fibrillation ([place])   . Cardiomyopathy    EF55% 11/14<<35%   . CHF (congestive heart failure) ([place])   . COPD (chronic obstructive pulmonary disease) ([place])   . Essential hypertension   . GERD (gastroesophageal reflux disease)   . Gout   . Hepatitis    Possible history  . History of atrial flutter    Ablation 2012  . 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 Obesity   . Pneumonia X 1  . Primary polycythemia ([place]) 06/12/2013  . Renal insufficiency   . Substance abuse ([place])  History of alcohol; hx cocaine, heroin, crack use  . Syncope    Social History   Socioeconomic History  . Marital status: Single    Spouse name: Not on file  . Number of children: Not on file  . Years of education: Not on file  . Highest education level: Not on file  Occupational History  . Occupation: Retired  Scientific laboratory technician  . Financial resource strain: Not on file  . Food insecurity    Worry: Not on file    Inability: Not on file  . Transportation needs    Medical: Not on file    Non-medical: Not on file  Tobacco Use  . Smoking status: Current Every Day Smoker    Packs/day: 0.10     Years: 50.00    Pack years: 5.00    Types: Cigarettes  . Smokeless tobacco: Never Used  . Tobacco comment: 2-3 cigs per day  Substance and Sexual Activity  . Alcohol use: Yes    Alcohol/week: 89.0 standard drinks    Types: 12 Cans of beer, 77 Shots of liquor per week    Comment: Sometimes.  . Drug use: Not Currently    Types: "Crack" cocaine, Cocaine, Heroin    Comment: 02/15/2017 "last used crack 4-5 years ago; last used heroin a couple years ago; currently denies any drug use-- on suboxone.   . Sexual activity: Not Currently  Lifestyle  . Physical activity    Days per week: Not on file    Minutes per session: Not on file  . Stress: Not on file  Relationships  . Social Herbalist on phone: Not on file    Gets together: Not on file    Attends religious service: Not on file    Active member of club or organization: Not on file    Attends meetings of clubs or organizations: Not on file    Relationship status: Not on file  Other Topics Concern  . Not on file  Social History Narrative   Moved here from [place]. Lives with common law wife and grandchildren. He is retired from "general labor."   Family History  Problem Relation Age of Onset  . Diabetes Mother   . Hypertension Mother   . Cirrhosis Father   . Alcohol abuse Father   . Colon cancer Neg Hx   . Rectal cancer Neg Hx   . Stomach cancer Neg Hx     ASSESSMENT Recent Results: The most recent result is correlated with 17.5 mg per week: Lab Results  Component Value Date   INR 2.3 05/29/2019   INR 1.7 (H) 05/17/2019   INR 1.7 (H) 05/16/2019    Anticoagulation Dosing: Description   Take one (1) tablet of your 2.5mg  green-colored warfarin tablets every day of the week.       INR today: Therapeutic  PLAN Weekly dose was unchanged.   Patient Instructions  Patient instructed to take medications as defined in the Anti-coagulation Track section of this encounter.  Patient instructed to take today's  dose.  Patient instructed to take one (1) tablet of your 2.5mg  green-colored warfarin tablets every day of the week.   Patient verbalized understanding of these instructions.    Patient advised to contact clinic or seek medical attention if signs/symptoms of bleeding or thromboembolism occur.  Patient verbalized understanding by repeating back information and was advised to contact me if further medication-related questions arise. Patient was also provided an information handout.  Follow-up Return in  3 weeks (on 06/19/2019) for INR Follow up.  Agnes Lawrence, PharmD PGY1 Pharmacy Resident  15 minutes spent face-to-face with the patient during the encounter. 50% of time spent on education, including signs/sx bleeding and clotting, as well as food and drug interactions with warfarin. 50% of time was spent on fingerprick POC INR sample collection,processing, results determination, and documentation in [email or web address]

## 2019-05-31 NOTE — Progress Notes (Signed)
I reviewed the anticoagulation note.  Patient is on [facility] for Afib.  INR is at goal.  No change to medication dose.

## 2019-06-02 ENCOUNTER — Telehealth: Payer: Self-pay | Admitting: Acute Care

## 2019-06-04 NOTE — Progress Notes (Signed)
Cardiology Office Note Date:  06/08/2019  Patient ID:  Ryan, Hamilton [birth year], MRN [id number] PCP:  Marianna Payment, MD  Electrophysiologist:  Dr. Lovena Le     Chief Complaint:  planned f/u  History of Present Illness: [name] is a 73 y.o. 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 Lovena Le. Last seen by EP service 02/2018 by A. Lynnell Jude, NP.  At that time noting only SR by last few years of EKGs, asymptomatic SB.  He had some edema off his lasix and this was resumed.   He was more recently hospitalized July 23 with sudden onset of SOB, resp failure, required intubation, was COVID negative, initially treated as pneumonia, COPD exacerbation, flash pulm edema, ? CHF.  W/u noted LVEF was 35-40%.  Cardiology was consulted, not felt to have ACS or need for ischemic evaluation.  Not a candidate for ACE/.ARB with renal function.  BP was limtting addition of hydralazine or nitrate Suggested out pt monitoring to evaluate for any potential rhythm abnormalities that may have contributed to his sudden onset SOB/respiratory failure, consideration for outpatient DCCV or perhaps stop amio if rhythm control no longer being pursued. He was discharged 02/13/2019  Long term monitor was ordered though I dont see was done. ?? Placed 8/8 INRs 02/13/2019, 2.5 02/20/2019, 2.9  02/20/2019: Creat 1.47 (looks about his baseline)    I saw him 03/14/2019 in f/u he spoke English well, and reported understands as well, no need for translation.  He reported sine the hospital he has done well.  He remained with some swelling LE unchanged despite the increase in his lasix a few weeks ago by his PMD.  Not any worse either.  He denied CP or palpitations, no cardiac awareness of any kind.  He reporeds initially had some SOB at night that kept him  up, but not regularly and not of late.  He denied rest SOB, he did have DOE though much improved from when he went to the hospital.  No dizzy spells, no near syncope or syncope. He denies any bleeding or signs of bleeding  He was unaware of his Afib though until recently felt to have been maintaining SR by notes and planned to pursue DCCV.  The patient did not want TEE so planned for weekly INRs and AFib clinic in 3 weeks  f/u to arrange DCCV.  Recommended repeat echo once back in SR.  Monitor results were not available at the time of his visit.  Planned for labs and adjustment in his diuretics for edema with instructions to call/seek help if SOB.  His lasix was increased for 3 days and then to resume BID His amio was increased to full tab 200mg  QD for 2 weeks then back to 1/2 tab  He was hospitalized 03/31/2019 with acute resp failure failed BIPAP and required intubation >> diuresed and extubated 04/02/2019.  AKI on CKD with Creat trending downwards at time of discharge.  He remained in Afib, discharge INR was 1.9. Home lasix was doubled  He was the Afib clinic 04/17/2019.  He felt well, unaware of his Afib.   04/27/2019 I saw him He reported feeling "good!".  No rest SOB, did have some DOE.  Not with short walks, he could ambulate about his home without SOB, walked in today he said  comfortably, but with longer walks (that he has hard time being speific with) he gets winded.  This is new in this year.  He denied any symptoms or PND or orthopnea. He denied any kind of CP, palpitations or cardiac awareness of any kind.  Not now or historically.  No dizzy spells, no near syncope or syncope  He was following his warfarin with his PMD, denied any bleeding or signs of bleeding  His LVEF was down by his last echo, had a recurrent flash pulm edema/respiratory failure that has required intubation And I left  him on the higher diuretic dose, planned for labs and if renal function allowed, planned to get him on  an ACE/ARB.  Hopefully if able to maintain SR that should help as well Given his COPD, and on amiodarone for years, he was referred to pulmonary for formal evaluation  He saw Dr. Vaughan Browner 05/02/2019, COPD GOLD D, recommended to stop smoking, no clear evidence of interstitial lung disease with amio, planned for CT chest when able, planned for further pulmonary testing, no changes were made at that visit  Unfortunately hospitalized again with respiratory failure requiring intubation, discharged 05/17/2019.  This was felt to be multifactorial, COPD exacerbation, PNA, and CHF CT noted ascending thoracic diameter measured 4.9 x 4.9 no dissection  EKGs were sinus CT angio chest also noted  Multifocal airspace consolidation, most severe in the lower lobes bilaterally consistent with multifocal pneumonia. Aspiration could present in this manner and may be present concurrently with pneumonia.   He again tells me today that he feels "good"  He has a very lateral chest wall pain that is worsened by a twisting movement, no CP otherwise, no trauma, not changed with deep breaths.  No palpitations.  No dizzy spells, near syncope or syncope.  He has no rest SOB, denies symptoms of PND or orthopnea, sleeps with 2 pillows for years.  He tells me he walked in from the waiting room without SOB, does not park in handicap parking but usually by the time he gets inside [place] he is winded and will use the electric carts to do his shopping.  This is his baseline for at least a year, probably longer  He tells me he weighs at home every other day and his home weight 219-220lbs, steady since his discharge His discharge weight by hospital records was 232lbs, today is 220lbs He reports taking lasix 20mg  daily, not 40mg  BID as noted on his discharge summary  No bleeding or signs of bleeding, warfarin monitored and managed by his PMD  AFib AAD Amiodarone (goes back to at least 2014)  Device information MDT ILR,  implanted 08/23/13 for syncope, Reached RRT 10/26/16    Past Medical History:  Diagnosis Date  . Acute lower GI bleeding 02/15/2017  . Atrial fibrillation ([place])   . Cardiomyopathy    EF55% 11/14<<35%   . CHF (congestive heart failure) ([place])   . COPD (chronic obstructive pulmonary disease) ([place])   . Essential hypertension   . GERD (gastroesophageal reflux disease)   . Gout   . Hepatitis    Possible history  . History of atrial flutter    Ablation 2012  . 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 Obesity   . Pneumonia X 1  . Primary polycythemia ([place]) 06/12/2013  . Renal insufficiency   . Substance abuse ([place])    History of alcohol; hx cocaine, heroin, crack use  . Syncope     Past Surgical History:  Procedure Laterality  Date  . CARDIAC ELECTROPHYSIOLOGY MAPPING AND ABLATION  08/2010   [name] 09/07/2010 (12/14/2012)  . COLONOSCOPY     15-20 years ago had colon in [place]  . EXCISIONAL HEMORRHOIDECTOMY  1970's  . LOOP RECORDER IMPLANT N/A 08/23/2013   Procedure: LOOP RECORDER IMPLANT;  Surgeon: Deboraha Sprang, MD;  [facility] C[facility] LAB;  Service: Cardiovascular;  Laterality: N/A;  . MULTIPLE EXTRACTIONS WITH ALVEOLOPLASTY Bilateral 01/24/2016   Procedure: MULTIPLE EXTRACTION WITH ALVEOLOPLASTY BILATERAL;  Surgeon: Diona Browner, DDS;  Location: [facility];  Service: Oral Surgery;  Laterality: Bilateral;  . MULTIPLE TOOTH EXTRACTIONS  01/24/2016   MULTIPLE EXTRACTION WITH ALVEOLOPLASTY BILATERAL (Bilateral)    Current Outpatient Medications  Medication Sig Dispense Refill  . albuterol (PROAIR HFA) 108 (90 Base) MCG/ACT inhaler Inhale 1-2 puffs into the lungs every 6 (six) hours as needed for wheezing or shortness of breath. 18 g 3  . albuterol (PROVENTIL) (2.5 MG/3ML) 0.083% nebulizer solution Take 3 mLs (2.5 mg total) by nebulization 2 (two) times daily as needed for wheezing or shortness of breath. 75 mL 0  . amiodarone (PACERONE) 200 MG tablet TAKE 1/2 TABLET BY MOUTH EVERY DAY  (Patient taking differently: Take 100 mg by mouth daily. ) 45 tablet 3  . camphor-menthol (SARNA) lotion Apply topically 3 (three) times daily. (Patient taking differently: Apply 1 application topically daily as needed for itching. ) 222 mL 0  . carvedilol (COREG) 3.125 MG tablet Take 1 tablet in the morning and 2 tablets in the evening (Patient taking differently: Take 3.125-6.25 mg by mouth See admin instructions. Take 1 table (3.125 mg totally) by mouth t in the morning and 2 tablets (6.25 mg totally) by mouth in the evening) 270 tablet 2  . Fluticasone-Umeclidin-Vilant (TRELEGY ELLIPTA) 100-62.5-25 MCG/INH AEPB Inhale 1 puff into the lungs daily. 60 each 5  . folic acid (FOLVITE) 1 MG tablet Take 1 tablet (1 mg total) by mouth daily. 90 tablet 3  . hydroxyurea (HYDREA) 500 MG capsule Take 2 capsules (1,000 mg total) by mouth daily. MAY TAKE WITH FOOD TO MINIMIZE GI SIDE EFFECTS (Patient taking differently: Take 1,000 mg by mouth daily. ) 180 capsule 3  . SUBOXONE 8-2 MG FILM Place 1 Film under the tongue daily.     [name] warfarin (COUMADIN) 2.5 MG tablet Take 1 tablet (2.5 mg total) by mouth daily at 6 PM. 30 tablet 2  . furosemide (LASIX) 20 MG tablet Take 20 mg by mouth daily.     No current facility-administered medications for this visit.     Allergies:   Patient has no known allergies.   Social History:  The patient  reports that he has been smoking cigarettes. He has a 5.00 pack-year smoking history. He has never used smokeless tobacco. He reports current alcohol use of about 89.0 standard drinks of alcohol per week. He reports previous drug use. Drugs: "Crack" cocaine, Cocaine, and Heroin.   Family History:  The patient's family history includes Alcohol abuse in his father; Cirrhosis in his father; Diabetes in his mother; Hypertension in his mother.  ROS:  Please see the history of present illness.  All other systems are reviewed and otherwise negative.   PHYSICAL EXAM:  VS:  BP (!)  126/54   Pulse 63   Ht 5\' 6"  (1.676 m)   Wt 220 lb (99.8 kg)   BMI 35.51 kg/m  BMI: Body mass index is 35.51 kg/m. Well nourished, well developed, in no acute distress  HEENT: normocephalic, atraumatic  Neck: no JVD, carotid bruits or masses Cardiac:  RRR; no significant murmurs, no rubs, or gallops Lungs:  very slight insp wheeze b/l at bases, no air hunger, clear ortherwise , no rhonchi or rales  Abd: soft, nontender MS: no deformity or atrophy, walks with a cane Ext:  1+ edema to mid shin b/l, chronic looking skin changes Skin: warm and dry, no rash Neuro:  No gross deficits appreciated Psych: euthymic mood, full affect  ILR site is stable    EKG:  SR 63bpm, no ST/T changes   03/2019, 2 week Event monitor 1. Atrial fib with a slow VR, controlled VR and RVR 2. NSR with sinus bradycardia 3. Occaisional PVC's, couplets and brief NSVT 4. No prolonged pauses  Gregg Taylor,M.D.  Echocardiogram 02/10/2019: 1. The left ventricle has moderately reduced systolic function, with an ejection fraction of 35-40%. The cavity size was moderately dilated. There is mild concentric left ventricular hypertrophy. Left ventricular diastolic function could not be  evaluated secondary to atrial fibrillation. Left ventricular diffuse hypokinesis. 2. The right ventricle has normal systolic function. The cavity was normal. There is no increase in right ventricular wall thickness. Right ventricular systolic pressure is normal with an estimated pressure of 26.7 mmHg. 3. Large pleural effusion in the left lateral region. 4. Small pericardial effusion. 5. The pericardial effusion is posterior to the left ventricle. 6. Mild thickening of the mitral valve leaflet. 7. The aortic valve is tricuspid. Aortic valve regurgitation is moderate by color flow Doppler. 8. The aorta is abnormal in size and structure.  06/07/14: LVEF 60-65% 04/29/14; LVEF 50-55% 06/09/13; LVEF 50-55% 08/31/12: LVEF  40-45%     Recent Labs: 03/14/2019: TSH 4.920 05/13/2019: ALT 34; B Natriuretic Peptide 447.6 05/14/2019: Magnesium 1.7 05/16/2019: BUN 40; Creatinine, Ser 1.54; Hemoglobin 12.5; Platelets 128; Potassium 4.2; Sodium 137  05/15/2019: Triglycerides 127   CrCl cannot be calculated (Patient's most recent lab result is older than the maximum 21 days allowed.).   Wt Readings from Last 3 Encounters:  06/08/19 220 lb (99.8 kg)  05/17/19 232 lb 9.4 oz (105.5 kg)  05/02/19 219 lb 3.2 oz (99.4 kg)     Other studies reviewed: Additional studies/records reviewed today include: summarized above  ASSESSMENT AND PLAN:  1. Persistent AFib     CHA2DS2Vasc is 3, on warfarin, monitored and managed by his PMD     On amiodarone, no clear pulm tox by his pulm evaluation  2. CM 3. Chronic CHF (systolic) 4. COPD is severe     Recurrent respiratory failure     Not felt to need ischemic eval by cardiology consult (done in-pt), suspect NICM, ? If 2/2 his Afib        He has a number of hospitalizations with respiratory failure His COPD is descirbed as III or D by notes, I suspect this the biggest issue His LVEF was down in Sept Looks like he has maintained SR since early last month and may benefit from re-evaluation, would expect recovery  His weight is down from discharge, though has some edema Not sure how he got on the lowered lasix dose, will have him resume the 40mg  BID, BMET today and in 1 week Given CKD hesitant to add ACE/ARB, Entresto Refer to AHF clinic for evaluation, recommendations, and timing of f/u echo  Ascending Ao aneurysm > refer to vascular   Sees pulmonary next month, he reports compliance with his meds   Disposition:   As above, Dr. Lovena Le in 2 mo, sooner  if needed     Current medicines are reviewed at length with the patient today.  The patient did not have any concerns regarding medicines.    Venetia Night, PA-C 06/08/2019 8:46 AM     [address] [address] [phone] (office)  [phone] (fax)

## 2019-06-05 NOTE — Telephone Encounter (Signed)
ATC - No answer - Unable to leave voicemail - Will call back - Leave in my box to f/u on

## 2019-06-05 NOTE — Telephone Encounter (Signed)
[name], this pt was scheduled for [facility] and CT on 05/17/19 but was hospitalized .  CT angio chest was done while in hospital.  Can you review this and let me know when I should reschedule pt?

## 2019-06-07 NOTE — Telephone Encounter (Signed)
LMTC x 1  

## 2019-06-08 ENCOUNTER — Ambulatory Visit (INDEPENDENT_AMBULATORY_CARE_PROVIDER_SITE_OTHER): Payer: Medicare Other | Admitting: Physician Assistant

## 2019-06-08 ENCOUNTER — Other Ambulatory Visit: Payer: Self-pay

## 2019-06-08 VITALS — BP 126/54 | HR 63 | Ht 66.0 in | Wt 220.0 lb

## 2019-06-08 DIAGNOSIS — I4819 Other persistent atrial fibrillation: Secondary | ICD-10-CM

## 2019-06-08 DIAGNOSIS — I7121 Aneurysm of the ascending aorta, without rupture: Secondary | ICD-10-CM

## 2019-06-08 DIAGNOSIS — Z79899 Other long term (current) drug therapy: Secondary | ICD-10-CM | POA: Diagnosis not present

## 2019-06-08 DIAGNOSIS — I712 Thoracic aortic aneurysm, without rupture: Secondary | ICD-10-CM

## 2019-06-08 DIAGNOSIS — I5022 Chronic systolic (congestive) heart failure: Secondary | ICD-10-CM

## 2019-06-08 DIAGNOSIS — I428 Other cardiomyopathies: Secondary | ICD-10-CM

## 2019-06-08 LAB — BASIC METABOLIC PANEL
BUN/Creatinine Ratio: 14 (ref 10–24)
BUN: 17 mg/dL (ref 8–27)
CO2: 24 mmol/L (ref 20–29)
Calcium: 8.5 mg/dL — ABNORMAL LOW (ref 8.6–10.2)
Chloride: 106 mmol/L (ref 96–106)
Creatinine, Ser: 1.24 mg/dL (ref 0.76–1.27)
GFR calc Af Amer: 67 mL/min/{1.73_m2} (ref >59)
GFR calc non Af Amer: 58 mL/min/{1.73_m2} — ABNORMAL LOW (ref >59)
Glucose: 104 mg/dL — ABNORMAL HIGH (ref 65–99)
Potassium: 4.5 mmol/L (ref 3.5–5.2)
Sodium: 144 mmol/L (ref 134–144)

## 2019-06-08 MED ORDER — FUROSEMIDE 40 MG PO TABS: 40.0000 mg | ORAL_TABLET | Freq: Two times a day (BID) | ORAL | 2 refills | Status: DC

## 2019-06-08 NOTE — Patient Instructions (Addendum)
Medication Instructions:   START TAKING : LASIX 40 MG TWICE A DAY    *If you need a refill on your cardiac medications before your next appointment, please call your pharmacy*  Lab Work:  [place] BMET IN ONE WEEK   If you have labs (blood work) drawn today and your tests are completely normal, you will receive your results only by: [name] MyChart Message (if you have MyChart) OR . A paper copy in the mail If you have any lab test that is abnormal or we need to change your treatment, we will call you to review the results.  Testing/Procedures: NONE ORDERED  TODAY    Follow-Up: At [facility], you and your health needs are our priority.  As part of our continuing mission to provide you with exceptional heart care, we have created designated Provider Care Teams.  These Care Teams include your primary Cardiologist (physician) and Advanced Practice Providers (APPs -  Physician Assistants and Nurse Practitioners) who all work together to provide you with the care you need, when you need it.  Your next appointment:    NEXT AVAILABLE APPOINTMENT :  1. HEART AND VASCULAR  SURGERY FOR ASCENDING AORTIC ANEURYSM  2. CHF CONGESTIVE HEART FAILURE   2 MONTHS WITH  Provider:   You may see Cristopher Peru, MD or one of the following Advanced Practice Providers on your designated Care Team:    Chanetta Marshall, NP  Tommye Standard, PA-C  Legrand Como "Oda Kilts, [place]   Other Instructions

## 2019-06-13 NOTE — Telephone Encounter (Signed)
Spoke with pt and advised that per Eric Form, NP we will reschedule his lung cancer screening around mid December based on time to heal from pneumonia that was diagnosed in hospital in 04/2019. Pt verbalized understanding.  Will close this message and refer to referral notes.

## 2019-06-14 ENCOUNTER — Other Ambulatory Visit: Payer: Medicare Other | Admitting: *Deleted

## 2019-06-14 ENCOUNTER — Other Ambulatory Visit: Payer: Self-pay

## 2019-06-14 DIAGNOSIS — Z79899 Other long term (current) drug therapy: Secondary | ICD-10-CM

## 2019-06-14 LAB — BASIC METABOLIC PANEL
BUN/Creatinine Ratio: 17 (ref 10–24)
BUN: 24 mg/dL (ref 8–27)
CO2: 26 mmol/L (ref 20–29)
Calcium: 8.5 mg/dL — ABNORMAL LOW (ref 8.6–10.2)
Chloride: 102 mmol/L (ref 96–106)
Creatinine, Ser: 1.39 mg/dL — ABNORMAL HIGH (ref 0.76–1.27)
GFR calc Af Amer: 58 mL/min/{1.73_m2} — ABNORMAL LOW (ref >59)
GFR calc non Af Amer: 50 mL/min/{1.73_m2} — ABNORMAL LOW (ref >59)
Glucose: 141 mg/dL — ABNORMAL HIGH (ref 65–99)
Potassium: 4 mmol/L (ref 3.5–5.2)
Sodium: 143 mmol/L (ref 134–144)

## 2019-06-19 ENCOUNTER — Ambulatory Visit (INDEPENDENT_AMBULATORY_CARE_PROVIDER_SITE_OTHER): Payer: Medicare Other

## 2019-06-19 ENCOUNTER — Other Ambulatory Visit: Payer: Self-pay

## 2019-06-19 DIAGNOSIS — Z5181 Encounter for therapeutic drug level monitoring: Secondary | ICD-10-CM

## 2019-06-19 DIAGNOSIS — Z7901 Long term (current) use of anticoagulants: Secondary | ICD-10-CM

## 2019-06-19 DIAGNOSIS — I4891 Unspecified atrial fibrillation: Secondary | ICD-10-CM | POA: Diagnosis not present

## 2019-06-19 LAB — POCT INR: INR: 3.4 — AB (ref 2.0–3.0)

## 2019-06-19 NOTE — Progress Notes (Signed)
Anticoagulation Management [name] is a 73 y.o. male who reports to the clinic for monitoring of warfarin treatment.    Indication: atrial fibrillation; current long-term anticoagulation Duration: indefinite Supervising physician: Dr. Rebeca Alert  Anticoagulation Clinic Visit History: Patient does not report signs/symptoms of bleeding or thromboembolism  Other recent changes: No diet, medications, lifestyle changes Anticoagulation Episode Summary    Current INR goal:  2.0-3.0  TTR:  67.3 % (6.4 y)  Next INR check:  07/10/2019  INR from last check:  3.4 (06/19/2019)  Weekly max warfarin dose:    Target end date:  Indefinite  INR check location:  Anticoagulation Clinic  Preferred lab:    Send INR reminders to:     Indications   Atrial fibrillation (HCC) [I48.91]       Comments:          No Known Allergies  Current Outpatient Medications:  .  albuterol (PROAIR HFA) 108 (90 Base) MCG/ACT inhaler, Inhale 1-2 puffs into the lungs every 6 (six) hours as needed for wheezing or shortness of breath., Disp: 18 g, Rfl: 3 .  albuterol (PROVENTIL) (2.5 MG/3ML) 0.083% nebulizer solution, Take 3 mLs (2.5 mg total) by nebulization 2 (two) times daily as needed for wheezing or shortness of breath., Disp: 75 mL, Rfl: 0 .  amiodarone (PACERONE) 200 MG tablet, TAKE 1/2 TABLET BY MOUTH EVERY DAY (Patient taking differently: Take 100 mg by mouth daily. ), Disp: 45 tablet, Rfl: 3 .  camphor-menthol (SARNA) lotion, Apply topically 3 (three) times daily. (Patient taking differently: Apply 1 application topically daily as needed for itching. ), Disp: 222 mL, Rfl: 0 .  carvedilol (COREG) 3.125 MG tablet, Take 1 tablet in the morning and 2 tablets in the evening (Patient taking differently: Take 3.125-6.25 mg by mouth See admin instructions. Take 1 table (3.125 mg totally) by mouth t in the morning and 2 tablets (6.25 mg totally) by mouth in the evening), Disp: 270 tablet, Rfl: 2 .   Fluticasone-Umeclidin-Vilant (TRELEGY ELLIPTA) 100-62.5-25 MCG/INH AEPB, Inhale 1 puff into the lungs daily., Disp: 60 each, Rfl: 5 .  folic acid (FOLVITE) 1 MG tablet, Take 1 tablet (1 mg total) by mouth daily., Disp: 90 tablet, Rfl: 3 .  furosemide (LASIX) 40 MG tablet, Take 1 tablet (40 mg total) by mouth 2 (two) times daily., Disp: 180 tablet, Rfl: 2 .  hydroxyurea (HYDREA) 500 MG capsule, Take 2 capsules (1,000 mg total) by mouth daily. MAY TAKE WITH FOOD TO MINIMIZE GI SIDE EFFECTS (Patient taking differently: Take 1,000 mg by mouth daily. ), Disp: 180 capsule, Rfl: 3 .  SUBOXONE 8-2 MG FILM, Place 1 Film under the tongue daily. , Disp: , Rfl:  .  warfarin (COUMADIN) 2.5 MG tablet, Take 1 tablet (2.5 mg total) by mouth daily at 6 PM., Disp: 30 tablet, Rfl: 2 Past Medical History:  Diagnosis Date  . Acute lower GI bleeding 02/15/2017  . Atrial fibrillation ([place])   . Cardiomyopathy    EF55% 11/14<<35%   . CHF (congestive heart failure) ([place])   . COPD (chronic obstructive pulmonary disease) ([place])   . Essential hypertension   . GERD (gastroesophageal reflux disease)   . Gout   . Hepatitis    Possible history  . History of atrial flutter    Ablation 2012  . 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 Obesity   . Pneumonia X 1  . Primary polycythemia ([place]) 06/12/2013  . Renal insufficiency   . Substance abuse ([place])  History of alcohol; hx cocaine, heroin, crack use  . Syncope    Social History   Socioeconomic History  . Marital status: Single    Spouse name: Not on file  . Number of children: Not on file  . Years of education: Not on file  . Highest education level: Not on file  Occupational History  . Occupation: Retired  Scientific laboratory technician  . Financial resource strain: Not on file  . Food insecurity    Worry: Not on file    Inability: Not on file  . Transportation needs    Medical: Not on file    Non-medical: Not on file  Tobacco Use  . Smoking status: Current Every  Day Smoker    Packs/day: 0.10    Years: 50.00    Pack years: 5.00    Types: Cigarettes  . Smokeless tobacco: Never Used  . Tobacco comment: 2-3 cigs per day  Substance and Sexual Activity  . Alcohol use: Yes    Alcohol/week: 89.0 standard drinks    Types: 12 Cans of beer, 77 Shots of liquor per week    Comment: Sometimes.  . Drug use: Not Currently    Types: "Crack" cocaine, Cocaine, Heroin    Comment: 02/15/2017 "last used crack 4-5 years ago; last used heroin a couple years ago; currently denies any drug use-- on suboxone.   . Sexual activity: Not Currently  Lifestyle  . Physical activity    Days per week: Not on file    Minutes per session: Not on file  . Stress: Not on file  Relationships  . Social Herbalist on phone: Not on file    Gets together: Not on file    Attends religious service: Not on file    Active member of club or organization: Not on file    Attends meetings of clubs or organizations: Not on file    Relationship status: Not on file  Other Topics Concern  . Not on file  Social History Narrative   Moved here from [place]. Lives with common law wife and grandchildren. He is retired from "general labor."   Family History  Problem Relation Age of Onset  . Diabetes Mother   . Hypertension Mother   . Cirrhosis Father   . Alcohol abuse Father   . Colon cancer Neg Hx   . Rectal cancer Neg Hx   . Stomach cancer Neg Hx     ASSESSMENT Recent Results: The most recent result is correlated with 17.5 mg per week: Lab Results  Component Value Date   INR 3.4 (A) 06/19/2019   INR 2.3 05/29/2019   INR 1.7 (H) 05/17/2019    Anticoagulation Dosing: Description   Take one (1) tablet of your 2.5mg  green-colored warfarin tablets every day of the week except on Mondays and Thursdays take (1/2) tablet.      INR today: Supratherapeutic  PLAN Weekly dose was decreased by 14% to 15 mg per week  Patient Instructions  Patient instructed to take  medications as defined in the Anti-coagulation Track section of this encounter.  Patient instructed to take today's dose.  Patient instructed to take one (1) tablet of your 2.5mg  green-colored warfarin tablets every day of the week except on Mondays and Thursdays take (1/2) tablet.  Patient verbalized understanding of these instructions.    Patient advised to contact clinic or seek medical attention if signs/symptoms of bleeding or thromboembolism occur.  Patient verbalized understanding by repeating back information and was  advised to contact me if further medication-related questions arise. Patient was also provided an information handout.  Follow-up Return in 3 weeks (on 07/10/2019) for Follow up INR at 8:45 am. .  Agnes Lawrence, PharmD PGY1 Pharmacy Resident   15 minutes spent face-to-face with the patient during the encounter. 50% of time spent on education, including signs/sx bleeding and clotting, as well as food and drug interactions with warfarin. 50% of time was spent on fingerprick POC INR sample collection,processing, results determination, and documentation in [email or web address].[email or web address]/.

## 2019-06-19 NOTE — Patient Instructions (Signed)
Patient instructed to take medications as defined in the Anti-coagulation Track section of this encounter.  Patient instructed to take today's dose.  Patient instructed to take one (1) tablet of your 2.5mg  green-colored warfarin tablets every day of the week except on Mondays and Thursdays take (1/2) tablet.  Patient verbalized understanding of these instructions.

## 2019-06-20 NOTE — Progress Notes (Signed)
INTERNAL MEDICINE TEACHING ATTENDING ADDENDUM  I agree with pharmacy recommendations as outlined in their note.   Alexander N Raines, MD  

## 2019-06-30 ENCOUNTER — Other Ambulatory Visit (HOSPITAL_COMMUNITY)
Admission: RE | Admit: 2019-06-30 | Discharge: 2019-06-30 | Disposition: A | Discharge status: Home / Self Care | Payer: Medicare Other | Source: Ambulatory Visit | Attending: Pulmonary Disease | Admitting: Pulmonary Disease

## 2019-06-30 DIAGNOSIS — Z20828 Contact with and (suspected) exposure to other viral communicable diseases: Secondary | ICD-10-CM | POA: Insufficient documentation

## 2019-07-01 LAB — NOVEL CORONAVIRUS, NAA (HOSP ORDER, SEND-OUT TO REF LAB; TAT 18-24 HRS): SARS-CoV-2, NAA: NOT DETECTED

## 2019-07-03 ENCOUNTER — Other Ambulatory Visit: Payer: Self-pay | Admitting: *Deleted

## 2019-07-03 DIAGNOSIS — J449 Chronic obstructive pulmonary disease, unspecified: Secondary | ICD-10-CM

## 2019-07-04 ENCOUNTER — Ambulatory Visit (INDEPENDENT_AMBULATORY_CARE_PROVIDER_SITE_OTHER): Payer: Medicare Other | Admitting: Pulmonary Disease

## 2019-07-04 ENCOUNTER — Encounter: Payer: Self-pay | Admitting: Pulmonary Disease

## 2019-07-04 ENCOUNTER — Other Ambulatory Visit: Payer: Self-pay

## 2019-07-04 ENCOUNTER — Ambulatory Visit: Payer: Medicare Other

## 2019-07-04 VITALS — BP 122/70 | HR 68 | Temp 98.0°F | Ht 64.0 in | Wt 226.0 lb

## 2019-07-04 DIAGNOSIS — R0683 Snoring: Secondary | ICD-10-CM

## 2019-07-04 DIAGNOSIS — R0600 Dyspnea, unspecified: Secondary | ICD-10-CM

## 2019-07-04 DIAGNOSIS — G4733 Obstructive sleep apnea (adult) (pediatric): Secondary | ICD-10-CM | POA: Diagnosis not present

## 2019-07-04 DIAGNOSIS — J449 Chronic obstructive pulmonary disease, unspecified: Secondary | ICD-10-CM | POA: Diagnosis not present

## 2019-07-04 LAB — COMPREHENSIVE METABOLIC PANEL
ALT: 12 U/L (ref 0–53)
AST: 17 U/L (ref 0–37)
Albumin: 3.6 g/dL (ref 3.5–5.2)
Alkaline Phosphatase: 104 U/L (ref 39–117)
BUN: 21 mg/dL (ref 6–23)
CO2: 30 mEq/L (ref 19–32)
Calcium: 9 mg/dL (ref 8.4–10.5)
Chloride: 106 mEq/L (ref 96–112)
Creatinine, Ser: 1.27 mg/dL (ref 0.40–1.50)
GFR: 55.73 mL/min — ABNORMAL LOW (ref >60.00)
Glucose, Bld: 109 mg/dL — ABNORMAL HIGH (ref 70–99)
Potassium: 4.4 mEq/L (ref 3.5–5.1)
Sodium: 141 mEq/L (ref 135–145)
Total Bilirubin: 0.6 mg/dL (ref 0.2–1.2)
Total Protein: 6.9 g/dL (ref 6.0–8.3)

## 2019-07-04 LAB — PULMONARY FUNCTION TEST
DL/VA % pred: 103 %
DL/VA: 4.25 ml/min/mmHg/L
DLCO unc % pred: 79 %
DLCO unc: 16.85 ml/min/mmHg
FEF 25-75 Post: 1.4 L/sec
FEF 25-75 Pre: 1.12 L/sec
FEF2575-%Change-Post: 25 %
FEF2575-%Pred-Post: 76 %
FEF2575-%Pred-Pre: 60 %
FEV1-%Change-Post: 4 %
FEV1-%Pred-Post: 67 %
FEV1-%Pred-Pre: 64 %
FEV1-Post: 1.66 L
FEV1-Pre: 1.58 L
FEV1FVC-%Change-Post: 0 %
FEV1FVC-%Pred-Pre: 95 %
FEV6-%Change-Post: 7 %
FEV6-%Pred-Post: 76 %
FEV6-%Pred-Pre: 71 %
FEV6-Post: 2.4 L
FEV6-Pre: 2.24 L
FEV6FVC-%Pred-Post: 107 %
FEV6FVC-%Pred-Pre: 107 %
FVC-%Change-Post: 5 %
FVC-%Pred-Post: 70 %
FVC-%Pred-Pre: 66 %
FVC-Post: 2.4 L
FVC-Pre: 2.27 L
Post FEV1/FVC ratio: 69 %
Post FEV6/FVC ratio: 100 %
Pre FEV1/FVC ratio: 70 %
Pre FEV6/FVC Ratio: 100 %
RV % pred: 146 %
RV: 3.16 L
TLC % pred: 91 %
TLC: 5.35 L

## 2019-07-04 LAB — BRAIN NATRIURETIC PEPTIDE: Pro B Natriuretic peptide (BNP): 213 pg/mL — ABNORMAL HIGH (ref 0.0–100.0)

## 2019-07-04 MED ORDER — ROFLUMILAST 500 MCG PO TABS: 500.0000 ug | ORAL_TABLET | Freq: Every day | ORAL | 11 refills | Status: DC

## 2019-07-04 NOTE — Progress Notes (Signed)
[name]    [id number]    [birth year]  Primary Care Physician:Coe, [name], MD  Referring Physician: Marianna Payment, MD [place]. [name],  [name] [id number]  Chief complaint: Follow-up for COPD  HPI: 73 year old with history of hypertension, a flutter, persistent atrial fibrillation, polycythemia, COPD, chronic kidney disease, nonischemic cardiomyopathy.  History of narcotic abuse on Suboxone.  Referred for evaluation of COPD  Hospitalized in July and September 2020 with respiratory failure, requiring intubation.  COVID-19 tests were negative.  Treated for pulmonary edema, AECOPD and pneumonitis.  Echocardiogram shows EF 35-40% which was thought to be due to nonischemic cardiomyopathy. Symptoms improved with aggressive diuresis and was extubated.  Also seen by cardiology for persistent atrial fibrillation.  Patient is on amiodarone and rate control with Coreg.  Per patient he has been on amiodarone for at least a year.  Discharged at last visit with Trelegy inhaler.  He was previously not on any inhalers.  PFTs in 2015 showed severe COPD.  Current symptoms are chronic dyspnea on exertion.  Denies any cough, sputum production, fevers, chills Also has history of polycythemia with Jak 2 mutation and is on hydroxyurea for this.  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  No mold, hot tub, Jacuzzi, down pillows or comforters Smoking history: 40-60 pack-year smoking history.  Continues to smoke Travel history: Originally from [name].  Previously lived in [place].  No significant recent travel Relevant family history: No significant family history of lung disease  Interim history:  Hospitalized again in October for acute on chronic respiratory failure secondary to CHF, COPD.  He was briefly intubated  Continues on Trelegy inhaler, continues to smoke Is going to get a sleep  study this week.   Outpatient Encounter Medications as of 07/04/2019  Medication Sig  . albuterol (PROAIR HFA) 108 (90 Base) MCG/ACT inhaler Inhale 1-2 puffs into the lungs every 6 (six) hours as needed for wheezing or shortness of breath.  [name] albuterol (PROVENTIL) (2.5 MG/3ML) 0.083% nebulizer solution Take 3 mLs (2.5 mg total) by nebulization 2 (two) times daily as needed for wheezing or shortness of breath.  [name] amiodarone (PACERONE) 200 MG tablet TAKE 1/2 TABLET BY MOUTH EVERY DAY (Patient taking differently: Take 100 mg by mouth daily. )  . camphor-menthol (SARNA) lotion Apply topically 3 (three) times daily. (Patient taking differently: Apply 1 application topically daily as needed for itching. )  . carvedilol (COREG) 3.125 MG tablet Take 1 tablet in the morning and 2 tablets in the evening (Patient taking differently: Take 3.125-6.25 mg by mouth See admin instructions. Take 1 table (3.125 mg totally) by mouth t in the morning and 2 tablets (6.25 mg totally) by mouth in the evening)  . Fluticasone-Umeclidin-Vilant (TRELEGY ELLIPTA) 100-62.5-25 MCG/INH AEPB Inhale 1 puff into the lungs daily.  . folic acid (FOLVITE) 1 MG tablet Take 1 tablet (1 mg total) by mouth daily.  . furosemide (LASIX) 40 MG tablet Take 1 tablet (40 mg total) by mouth 2 (two) times daily.  . hydroxyurea (HYDREA) 500 MG capsule Take 2 capsules (1,000 mg total) by mouth daily. MAY TAKE WITH FOOD TO MINIMIZE GI SIDE EFFECTS (Patient taking differently: Take 1,000 mg by mouth daily. )  . SUBOXONE 8-2 MG FILM Place 1 Film under the tongue daily.   [name] warfarin (COUMADIN) 2.5 MG tablet Take 1 tablet (2.5 mg total)  by mouth daily at 6 PM.   No facility-administered encounter medications on file as of 07/04/2019.   Physical Exam: Blood pressure 122/70, pulse 68, temperature 98 F (36.7 C), temperature source Temporal, height 5\' 4"  (1.626 m), weight 226 lb (102.5 kg), SpO2 95 %. Gen:      No acute distress HEENT:  EOMI, sclera  anicteric Neck:     No masses; no thyromegaly Lungs:    Scattered expiratory crackles CV:         Regular rate and rhythm; no murmurs Abd:      + bowel sounds; soft, non-tender; no palpable masses, no distension Ext:    No edema; adequate peripheral perfusion Skin:      Warm and dry; no rash Neuro: alert and oriented x 3 Psych: normal mood and affect  Data Reviewed: Imaging: CTA 06/05/2014- no pulmonary embolism.  Ascending thoracic aorta aneurysm.  Right thyroid nodule.  Mild bibasal atelectasis. Chest x-ray 02/09/2019-vague bilateral airspace opacities.  Vascular congestion, mild cardiomegaly. Chest x-ray 03/31/2019-pulmonary vascular congestion with patchy alveolar edema. Chest x-ray 04/02/2019- resolved right midlung atelectasis. CTA 05/13/2019-no PE, multifocal airspace consolidation in the lower lobes and middle lobe I have reviewed the images personally.  PFTs: 05/30/2014- FVC 2.30 [59%], FEV1 1.35 [47%], F/F 59, TLC 4.57 [73%], DLCO 16.84 [62%] Severe obstruction with air trapping.  Moderate restriction and diffusion defect.  07/04/2019-FVC 2.40 [70%], FEV1 1.66 [61%], F/F 69, TLC 4.35 [91%], DLCO 16.85 [79% Moderate obstruction with minimal diffusion defect  [facility] 05/02/2019-3  Labs: IgE 05/02/2019-176 Alpha-1 antitrypsin levels and phenotype 05/02/2019-148, PI MM CBC 05/02/2019-WBC 3.9, eos 2.8%, absolute eosinophil 109  Assessment:  COPD Gold D Has had several recent admissions with respiratory failure requiring intubation recently.  This is likely combination of COPD, CHF in the setting of persistent atrial fibrillation, cardiomyopathy Suspect his major issues are cardiac.  PFTs today reviewed with improvement in obstructive airway disease compared to 2015  Continue Trelegy inhaler Start Daliresp for recurrent exacerbations Though he has acute hypercarbia during his admissions he does not have chronic CO2 retention based on normal bicarb levels to meet criteria for  BiPAP.  Repeat metabolic panel and BNP today  He is on amiodarone for atrial fibrillation.  Get high-resolution CT for evaluation of interstitial lung disease  Active smoker Discussed smoking cessation with patient and encouraged strongly to quit.  Offered use of nicotine patch and Chantix but patient wants to quit on his own.  Reassess at return visit.  Time spent counseling-5 minutes  Suspected sleep apnea He has polycythemia with Jak 2 mutation.  Likely has undiagnosed  OSA/OHS which is also contributing to the problem as he has obesity and symptoms of snoring Home study pending  Health maintenance 04/24/2019-influenza 05/24/2014-Prevnar 04/13/2018-Pneumovax  Plan/Recommendations: - Continue Trelegy - Metabolic panel, BNP - Smoking cessation - CT chest - Sleep study.  Marshell Garfinkel MD [facility] 07/04/2019, 10:24 AM  CC: Marianna Payment, MD

## 2019-07-04 NOTE — Progress Notes (Signed)
Full PFT performed today. °

## 2019-07-04 NOTE — Patient Instructions (Addendum)
We will check a metabolic panel, BNP today Continue the Trelegy inhaler Schedule sleep study We will check your oxygen levels on walking Start Daliresp for recurrent COPD exacerbation Schedule high-res CT  Follow-up in 2 to 4 weeks.

## 2019-07-04 NOTE — Addendum Note (Signed)
Addended by: Suzzanne Cloud E on: 07/04/2019 11:54 AM   Modules accepted: Orders

## 2019-07-10 ENCOUNTER — Ambulatory Visit (INDEPENDENT_AMBULATORY_CARE_PROVIDER_SITE_OTHER): Payer: Medicare Other

## 2019-07-10 DIAGNOSIS — Z5181 Encounter for therapeutic drug level monitoring: Secondary | ICD-10-CM | POA: Diagnosis not present

## 2019-07-10 DIAGNOSIS — I4891 Unspecified atrial fibrillation: Secondary | ICD-10-CM | POA: Diagnosis not present

## 2019-07-10 DIAGNOSIS — Z7901 Long term (current) use of anticoagulants: Secondary | ICD-10-CM | POA: Diagnosis not present

## 2019-07-10 LAB — POCT INR: INR: 1.9 — AB (ref 2.0–3.0)

## 2019-07-10 NOTE — Progress Notes (Signed)
Anticoagulation Management [name] is a 73 y.o. male who reports to the clinic for monitoring of warfarin treatment.    Indication: atrial fibrillation history of Duration: indefinite Supervising physician: Joni Reining  Anticoagulation Clinic Visit History: Patient does not report signs/symptoms of bleeding or thromboembolism  Other recent changes: No diet, medications, lifestyle changes Anticoagulation Episode Summary    Current INR goal:  2.0-3.0  TTR:  67.2 % (6.4 y)  Next INR check:  07/31/2019  INR from last check:  1.9 (07/10/2019)  Weekly max warfarin dose:    Target end date:  Indefinite  INR check location:  Anticoagulation Clinic  Preferred lab:    Send INR reminders to:     Indications   Atrial fibrillation (HCC) [I48.91]       Comments:          No Known Allergies  Current Outpatient Medications:  .  albuterol (PROAIR HFA) 108 (90 Base) MCG/ACT inhaler, Inhale 1-2 puffs into the lungs every 6 (six) hours as needed for wheezing or shortness of breath., Disp: 18 g, Rfl: 3 .  albuterol (PROVENTIL) (2.5 MG/3ML) 0.083% nebulizer solution, Take 3 mLs (2.5 mg total) by nebulization 2 (two) times daily as needed for wheezing or shortness of breath., Disp: 75 mL, Rfl: 0 .  amiodarone (PACERONE) 200 MG tablet, TAKE 1/2 TABLET BY MOUTH EVERY DAY (Patient taking differently: Take 100 mg by mouth daily. ), Disp: 45 tablet, Rfl: 3 .  camphor-menthol (SARNA) lotion, Apply topically 3 (three) times daily. (Patient taking differently: Apply 1 application topically daily as needed for itching. ), Disp: 222 mL, Rfl: 0 .  carvedilol (COREG) 3.125 MG tablet, Take 1 tablet in the morning and 2 tablets in the evening (Patient taking differently: Take 3.125-6.25 mg by mouth See admin instructions. Take 1 table (3.125 mg totally) by mouth t in the morning and 2 tablets (6.25 mg totally) by mouth in the evening), Disp: 270 tablet, Rfl: 2 .  Fluticasone-Umeclidin-Vilant  (TRELEGY ELLIPTA) 100-62.5-25 MCG/INH AEPB, Inhale 1 puff into the lungs daily., Disp: 60 each, Rfl: 5 .  folic acid (FOLVITE) 1 MG tablet, Take 1 tablet (1 mg total) by mouth daily., Disp: 90 tablet, Rfl: 3 .  furosemide (LASIX) 40 MG tablet, Take 1 tablet (40 mg total) by mouth 2 (two) times daily., Disp: 180 tablet, Rfl: 2 .  hydroxyurea (HYDREA) 500 MG capsule, Take 2 capsules (1,000 mg total) by mouth daily. MAY TAKE WITH FOOD TO MINIMIZE GI SIDE EFFECTS (Patient taking differently: Take 1,000 mg by mouth daily. ), Disp: 180 capsule, Rfl: 3 .  roflumilast (DALIRESP) 500 MCG TABS tablet, Take 1 tablet (500 mcg total) by mouth daily., Disp: 30 tablet, Rfl: 11 .  SUBOXONE 8-2 MG FILM, Place 1 Film under the tongue daily. , Disp: , Rfl:  .  warfarin (COUMADIN) 2.5 MG tablet, Take 1 tablet (2.5 mg total) by mouth daily at 6 PM., Disp: 30 tablet, Rfl: 2 Past Medical History:  Diagnosis Date  . Acute lower GI bleeding 02/15/2017  . Atrial fibrillation ([place])   . Cardiomyopathy    EF55% 11/14<<35%   . CHF (congestive heart failure) ([place])   . COPD (chronic obstructive pulmonary disease) ([place])   . Essential hypertension   . GERD (gastroesophageal reflux disease)   . Gout   . Hepatitis    Possible history  . History of atrial flutter    Ablation 2012  . 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 Obesity   .  Pneumonia X 1  . Primary polycythemia ([place]) 06/12/2013  . Renal insufficiency   . Substance abuse ([place])    History of alcohol; hx cocaine, heroin, crack use  . Syncope    Social History   Socioeconomic History  . Marital status: Single    Spouse name: Not on file  . Number of children: Not on file  . Years of education: Not on file  . Highest education level: Not on file  Occupational History  . Occupation: Retired  Tobacco Use  . Smoking status: Current Every Day Smoker    Packs/day: 0.10    Years: 50.00    Pack years: 5.00    Types: Cigarettes  . Smokeless tobacco:  Never Used  . Tobacco comment: 1-2 cigarettes a day  Substance and Sexual Activity  . Alcohol use: Yes    Alcohol/week: 89.0 standard drinks    Types: 12 Cans of beer, 77 Shots of liquor per week    Comment: Sometimes.  . Drug use: Not Currently    Types: "Crack" cocaine, Cocaine, Heroin    Comment: 02/15/2017 "last used crack 4-5 years ago; last used heroin a couple years ago; currently denies any drug use-- on suboxone.   . Sexual activity: Not Currently  Other Topics Concern  . Not on file  Social History Narrative   Moved here from [place]. Lives with common law wife and grandchildren. He is retired from "general labor."   Social Determinants of Radio broadcast assistant Strain:   . Difficulty of Paying Living Expenses: Not on file  Food Insecurity:   . Worried About Charity fundraiser in the Last Year: Not on file  . Ran Out of Food in the Last Year: Not on file  Transportation Needs:   . Lack of Transportation (Medical): Not on file  . Lack of Transportation (Non-Medical): Not on file  Physical Activity:   . Days of Exercise per Week: Not on file  . Minutes of Exercise per Session: Not on file  Stress:   . Feeling of Stress : Not on file  Social Connections:   . Frequency of Communication with Friends and Family: Not on file  . Frequency of Social Gatherings with Friends and Family: Not on file  . Attends Religious Services: Not on file  . Active Member of Clubs or Organizations: Not on file  . Attends Archivist Meetings: Not on file  . Marital Status: Not on file   Family History  Problem Relation Age of Onset  . Diabetes Mother   . Hypertension Mother   . Cirrhosis Father   . Alcohol abuse Father   . Colon cancer Neg Hx   . Rectal cancer Neg Hx   . Stomach cancer Neg Hx     ASSESSMENT Recent Results: The most recent result is correlated with 15 mg per week: Lab Results  Component Value Date   INR 1.9 (A) 07/10/2019   INR 3.4 (A) 06/19/2019    INR 2.3 05/29/2019    Anticoagulation Dosing: Description   Take one (1) tablet of your 2.5mg  green-colored warfarin tablets every day of the week.      INR today: Subtherapeutic  PLAN Weekly dose was increased by 15% to 17.5 mg per week  Patient Instructions  Patient instructed to take medications as defined in the Anti-coagulation Track section of this encounter.  Patient instructed to take today's dose.  Patient instructed to take one (1) tablet of your 2.5mg  green-colored warfarin  tablets every day of the week.  Patient verbalized understanding of these instructions.    Patient advised to contact clinic or seek medical attention if signs/symptoms of bleeding or thromboembolism occur.  Patient verbalized understanding by repeating back information and was advised to contact me if further medication-related questions arise. Patient was also provided an information handout.  Follow-up Patient will follow up in three weeks for INR check.   Agnes Lawrence, PharmD PGY1 Pharmacy Resident   15 minutes spent face-to-face with the patient during the encounter. 50% of time spent on education, including signs/sx bleeding and clotting, as well as food and drug interactions with warfarin. 50% of time was spent on fingerprick POC INR sample collection,processing, results determination, and documentation in [email or web address]/.

## 2019-07-10 NOTE — Patient Instructions (Signed)
Patient instructed to take medications as defined in the Anti-coagulation Track section of this encounter.  Patient instructed to take today's dose.  Patient instructed to take one (1) tablet of your 2.5mg  green-colored warfarin tablets every day of the week.  Patient verbalized understanding of these instructions.

## 2019-07-12 ENCOUNTER — Telehealth: Payer: Self-pay | Admitting: Pulmonary Disease

## 2019-07-12 DIAGNOSIS — R0600 Dyspnea, unspecified: Secondary | ICD-10-CM

## 2019-07-12 DIAGNOSIS — G4733 Obstructive sleep apnea (adult) (pediatric): Secondary | ICD-10-CM | POA: Diagnosis not present

## 2019-07-12 DIAGNOSIS — R0683 Snoring: Secondary | ICD-10-CM

## 2019-07-12 NOTE — Telephone Encounter (Signed)
HST showed severe OSA -AHI 62/hour with severe desaturations  Suggest schedule CPAP titration study ASAP Defer to Dr. Vaughan Browner about starting auto CPAP if titration study is delayed

## 2019-07-12 NOTE — Telephone Encounter (Signed)
ATC patient.  Person who answered stated Patient was not home at this time.  Requested message be given to Patient to call [facility] tomorrow, 07/13/19.

## 2019-07-13 ENCOUNTER — Other Ambulatory Visit: Payer: Self-pay | Admitting: *Deleted

## 2019-07-13 DIAGNOSIS — R0602 Shortness of breath: Secondary | ICD-10-CM

## 2019-07-13 NOTE — Telephone Encounter (Signed)
Called and spoke with pt letting him know the info stated by Dr. Elsworth Soho about the HST. Pt verbalized understanding. Order has been placed for cpap titration. Nothing further needed.

## 2019-07-24 ENCOUNTER — Other Ambulatory Visit: Payer: Self-pay

## 2019-07-24 DIAGNOSIS — D45 Polycythemia vera: Secondary | ICD-10-CM

## 2019-07-24 MED ORDER — HYDROXYUREA 500 MG PO CAPS: 1000.0000 mg | ORAL_CAPSULE | Freq: Every day | ORAL | 3 refills | Status: DC

## 2019-07-26 ENCOUNTER — Other Ambulatory Visit: Payer: Medicare Other

## 2019-07-31 ENCOUNTER — Encounter: Payer: Self-pay | Admitting: Internal Medicine

## 2019-07-31 ENCOUNTER — Ambulatory Visit: Payer: Medicare Other

## 2019-07-31 ENCOUNTER — Encounter: Payer: Medicare Other | Admitting: Internal Medicine

## 2019-08-02 ENCOUNTER — Inpatient Hospital Stay (HOSPITAL_COMMUNITY): Admission: RE | Admit: 2019-08-02 | Payer: Medicare Other | Source: Ambulatory Visit

## 2019-08-03 ENCOUNTER — Other Ambulatory Visit: Payer: Self-pay

## 2019-08-03 ENCOUNTER — Encounter (HOSPITAL_COMMUNITY): Payer: Self-pay | Admitting: Family Medicine

## 2019-08-03 ENCOUNTER — Emergency Department (HOSPITAL_COMMUNITY)
Admission: EM | Admit: 2019-08-03 | Discharge: 2019-08-03 | Disposition: A | Discharge status: Home / Self Care | Payer: Medicare Other | Attending: Emergency Medicine | Admitting: Emergency Medicine

## 2019-08-03 ENCOUNTER — Telehealth (HOSPITAL_COMMUNITY): Payer: Self-pay | Admitting: Vascular Surgery

## 2019-08-03 DIAGNOSIS — M25571 Pain in right ankle and joints of right foot: Secondary | ICD-10-CM | POA: Diagnosis present

## 2019-08-03 DIAGNOSIS — Z7901 Long term (current) use of anticoagulants: Secondary | ICD-10-CM | POA: Insufficient documentation

## 2019-08-03 DIAGNOSIS — I10 Essential (primary) hypertension: Secondary | ICD-10-CM | POA: Diagnosis not present

## 2019-08-03 DIAGNOSIS — J449 Chronic obstructive pulmonary disease, unspecified: Secondary | ICD-10-CM | POA: Insufficient documentation

## 2019-08-03 DIAGNOSIS — F17210 Nicotine dependence, cigarettes, uncomplicated: Secondary | ICD-10-CM | POA: Diagnosis not present

## 2019-08-03 DIAGNOSIS — I4891 Unspecified atrial fibrillation: Secondary | ICD-10-CM | POA: Diagnosis not present

## 2019-08-03 DIAGNOSIS — M25572 Pain in left ankle and joints of left foot: Secondary | ICD-10-CM | POA: Insufficient documentation

## 2019-08-03 DIAGNOSIS — I5041 Acute combined systolic (congestive) and diastolic (congestive) heart failure: Secondary | ICD-10-CM | POA: Insufficient documentation

## 2019-08-03 DIAGNOSIS — M109 Gout, unspecified: Secondary | ICD-10-CM | POA: Insufficient documentation

## 2019-08-03 LAB — CBG MONITORING, ED: Glucose-Capillary: 103 mg/dL — ABNORMAL HIGH (ref 70–99)

## 2019-08-03 MED ORDER — OXYCODONE-ACETAMINOPHEN 5-325 MG PO TABS
2.0000 | ORAL_TABLET | Freq: Once | ORAL | Status: AC
  Administered 2019-08-03: 01:00:00 2 via ORAL
  Filled 2019-08-03: qty 2

## 2019-08-03 MED ORDER — PREDNISONE 20 MG PO TABS: 40.0000 mg | ORAL_TABLET | Freq: Every day | ORAL | 0 refills | Status: DC

## 2019-08-03 NOTE — Telephone Encounter (Signed)
Called pt to make new chf appt w/ db, pt refused to get on the phone to make appt

## 2019-08-03 NOTE — ED Triage Notes (Signed)
Patient is from home and transported via [facility]. He is complaining of lower bilateral leg pain and weakness for the last week. Denies any recent injuries.

## 2019-08-03 NOTE — ED Provider Notes (Signed)
[facility] DEPT Provider Note   CSN: [id number] Arrival date & time: 08/03/19  0033     History Chief Complaint  Patient presents with  . Leg Pain    [name] is a 74 y.o. male.  Patient presents to the emergency department with chief complaint of bilateral ankle pain.  He states that he has been having persistent ankle pain x1 week.  He states that anytime he moves his ankles it hurts.  He rates his pain is moderate to severe.  He has not taken anything for his symptoms.  He states he cannot get any relief.  He denies any new or increased leg swelling.  Denies any new or worsening shortness of breath.  Denies any fevers or chills.  He does have a history of gout.  The history is provided by the patient. No language interpreter was used.       Past Medical History:  Diagnosis Date  . Acute lower GI bleeding 02/15/2017  . Atrial fibrillation ([place])   . Cardiomyopathy    EF55% 11/14<<35%   . CHF (congestive heart failure) ([place])   . COPD (chronic obstructive pulmonary disease) ([place])   . Essential hypertension   . GERD (gastroesophageal reflux disease)   . Gout   . Hepatitis    Possible history  . History of atrial flutter    Ablation 2012  . 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 Obesity   . Pneumonia X 1  . Primary polycythemia ([place]) 06/12/2013  . Renal insufficiency   . Substance abuse ([place])    History of alcohol; hx cocaine, heroin, crack use  . Syncope     Patient Active Problem List   Diagnosis Date Noted  . Acute on chronic systolic CHF (congestive heart failure) ([place]) 04/04/2019  . Acute respiratory failure with hypoxia ([place]) 03/31/2019  . Acute hypoxemic respiratory failure ([place]) 02/09/2019  . COPD exacerbation ([place]) 04/12/2018  . Elevated troponin 04/12/2018  . Long term current use of amiodarone 01/24/2018  . GI bleed 02/15/2017  . Pancytopenia, acquired ([place]) 11/26/2016  . Tubular adenoma  11/25/2016  . Gout 11/02/2016  . Encounter for central line placement 05/25/2016  . Hepatitis C antibody test positive   . Hematuria 11/09/2014  . Acute kidney injury ([place]) 11/01/2014  . Leukopenia due to antineoplastic chemotherapy ([place]) 08/24/2014  . Thrombocytopenia due to drugs 07/25/2014  . Encounter for screening colonoscopy 07/02/2014  . Chronic anticoagulation 07/02/2014  . Acute pulmonary edema ([place]) 06/05/2014  . Prolonged QT interval 06/05/2014  . Sinus bradycardia 06/05/2014  . Opioid dependence ([place]) 06/05/2014  . Diastolic dysfunction [id number]  . COPD (chronic obstructive pulmonary disease) ([place]) 05/30/2014  . Health care maintenance 05/24/2014  . Acute respiratory failure with hypoxia and hypercapnia ([place]) 04/29/2014  . Anemia due to antineoplastic chemotherapy 04/24/2014  . Thyroid mass 08/26/2013  . Syncope 08/22/2013  . 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 Polycythemia vera ([place]) 01/03/2013  . Malnutrition of moderate degree ([place]) 12/15/2012  . Tobacco abuse 09/15/2012  . 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 Alcoholism ([place]) 09/30/2010  . Hypertension 09/30/2010  . Atrial fibrillation [facility])     Past Surgical History:  Procedure Laterality Date  . CARDIAC ELECTROPHYSIOLOGY MAPPING AND ABLATION  08/2010   [name] 09/07/2010 (12/14/2012)  . COLONOSCOPY     15-20 years ago had colon in [place]  . EXCISIONAL HEMORRHOIDECTOMY  1970's  . LOOP RECORDER IMPLANT N/A 08/23/2013   Procedure: LOOP  RECORDER IMPLANT;  Surgeon: Deboraha Sprang, MD;  Location[facility] C[facility] LAB;  Service: Cardiovascular;  Laterality: N/A;  . MULTIPLE EXTRACTIONS WITH ALVEOLOPLASTY Bilateral 01/24/2016   Procedure: MULTIPLE EXTRACTION WITH ALVEOLOPLASTY BILATERAL;  Surgeon: Diona Browner, DDS;  Location: [facility];  Service: Oral Surgery;  Laterality: Bilateral;  . MULTIPLE TOOTH EXTRACTIONS  01/24/2016   MULTIPLE EXTRACTION WITH ALVEOLOPLASTY BILATERAL (Bilateral)        Family History  Problem Relation Age of Onset  . Diabetes Mother   . Hypertension Mother   . Cirrhosis Father   . Alcohol abuse Father   . Colon cancer Neg Hx   . Rectal cancer Neg Hx   . Stomach cancer Neg Hx     Social History   Tobacco Use  . Smoking status: Current Every Day Smoker    Packs/day: 0.10    Years: 50.00    Pack years: 5.00    Types: Cigarettes  . Smokeless tobacco: Never Used  . Tobacco comment: 1-2 cigarettes a day  Substance Use Topics  . Alcohol use: Yes    Alcohol/week: 89.0 standard drinks    Types: 12 Cans of beer, 77 Shots of liquor per week    Comment: Sometimes.  . Drug use: Not Currently    Types: "Crack" cocaine, Cocaine, Heroin    Comment: 02/15/2017 "last used crack 4-5 years ago; last used heroin a couple years ago; currently denies any drug use-- on suboxone.     Home Medications Prior to Admission medications   Medication Sig Start Date End Date Taking? Authorizing Provider  albuterol (PROAIR HFA) 108 (90 Base) MCG/ACT inhaler Inhale 1-2 puffs into the lungs every 6 (six) hours as needed for wheezing or shortness of breath. 01/11/19   [name], MD  albuterol (PROVENTIL) (2.5 MG/3ML) 0.083% nebulizer solution Take 3 mLs (2.5 mg total) by nebulization 2 (two) times daily as needed for wheezing or shortness of breath. 02/13/19   Kayleen Memos, DO  amiodarone (PACERONE) 200 MG tablet TAKE 1/2 TABLET BY MOUTH EVERY DAY Patient taking differently: Take 100 mg by mouth daily.  04/18/19   Baldwin Jamaica, PA-C  camphor-menthol [facility]) lotion Apply topically 3 (three) times daily. Patient taking differently: Apply 1 application topically daily as needed for itching.  02/13/19   Kayleen Memos, DO  carvedilol (COREG) 3.125 MG tablet Take 1 tablet in the morning and 2 tablets in the evening Patient taking differently: Take 3.125-6.25 mg by mouth See admin instructions. Take 1 table (3.125 mg totally) by mouth t in the morning and 2 tablets (6.25  mg totally) by mouth in the evening 04/17/19   Fenton, Clint R, PA  Fluticasone-Umeclidin-Vilant (TRELEGY ELLIPTA) 100-62.5-25 MCG/INH AEPB Inhale 1 puff into the lungs daily. 01/11/19   [name], MD  folic acid (FOLVITE) 1 MG tablet Take 1 tablet (1 mg total) by mouth daily. 09/02/18   [name], MD  furosemide (LASIX) 40 MG tablet Take 1 tablet (40 mg total) by mouth 2 (two) times daily. 06/08/19   Baldwin Jamaica, PA-C  hydroxyurea (HYDREA) 500 MG capsule Take 2 capsules (1,000 mg total) by mouth daily. MAY TAKE WITH FOOD TO MINIMIZE GI SIDE EFFECTS 07/24/19   Marianna Payment, MD  roflumilast (DALIRESP) 500 MCG TABS tablet Take 1 tablet (500 mcg total) by mouth daily. 07/04/19   Mannam, Hart Robinsons, MD  SUBOXONE 8-2 MG FILM Place 1 Film under the tongue daily.  08/21/15   [provider]  warfarin (COUMADIN) 2.5 MG tablet Take 1 tablet (2.5 mg total) by mouth daily at 6 PM. 05/01/19   Pennie Banter, RPH-CPP    Allergies    Patient has no known allergies.  Review of Systems   Review of Systems  All other systems reviewed and are negative.   Physical Exam Updated Vital Signs BP 133/60 (BP Location: Right Arm)   Pulse 70   Temp 97.8 F (36.6 C) (Oral)   Resp 19   SpO2 97%   Physical Exam Vitals and nursing note reviewed.  Constitutional:      Appearance: He is well-developed.  HENT:     Head: Normocephalic and atraumatic.  Eyes:     Conjunctiva/sclera: Conjunctivae normal.  Cardiovascular:     Rate and Rhythm: Normal rate and regular rhythm.     Pulses: Normal pulses.     Heart sounds: No murmur.     Comments: Distal pulses are palpated and normal Pulmonary:     Effort: Pulmonary effort is normal. No respiratory distress.     Breath sounds: Normal breath sounds.  Abdominal:     Palpations: Abdomen is soft.     Tenderness: There is no abdominal tenderness.  Musculoskeletal:     Cervical back: Neck supple.     Comments: ROM of ankles limited by pain  Skin:     General: Skin is warm and dry.     Comments: Darkening of skin in stocking pattern consistent with vascular insufficiency, (not new)  Neurological:     Mental Status: He is alert.     ED Results / Procedures / Treatments   Labs (all labs ordered are listed, but only abnormal results are displayed) Labs Reviewed - No data to display  EKG None  Radiology No results found.  Procedures Procedures (including critical care time)  Medications Ordered in ED Medications  oxyCODONE-acetaminophen (PERCOCET/ROXICET) 5-325 MG per tablet 2 tablet (has no administration in time range)    ED Course  I have reviewed the triage vital signs and the nursing notes.  Pertinent labs & imaging results that were available during my care of the patient were reviewed by me and considered in my medical decision making (see chart for details).    MDM Rules/Calculators/A&P                      Patient here with complaints of leg pain x1 week. Hx of gout.  Pain is in the ankles bilaterally.  Very tender to touch and worse with movement.  No evidence of cellulitis or abscess.  No new or increased swelling.  I believe this to be gout.  Patient seen by and discussed with Dr. Leonette Monarch, who agrees with plan for treatment of gout with prednisone.   Final Clinical Impression(s) / ED Diagnoses Final diagnoses:  Acute gout of ankle, unspecified cause, unspecified laterality    Rx / DC Orders ED Discharge Orders         Ordered    predniSONE (DELTASONE) 20 MG tablet  Daily     08/03/19 0239           Montine Circle, PA-C 08/03/19 0239    Fatima Blank, MD 08/03/19 [phone]

## 2019-08-05 ENCOUNTER — Encounter (HOSPITAL_BASED_OUTPATIENT_CLINIC_OR_DEPARTMENT_OTHER): Payer: Medicare Other | Admitting: Pulmonary Disease

## 2019-08-14 ENCOUNTER — Inpatient Hospital Stay: Payer: Medicare Other

## 2019-08-14 ENCOUNTER — Encounter: Payer: Self-pay | Admitting: Hematology and Oncology

## 2019-08-14 ENCOUNTER — Inpatient Hospital Stay: Payer: Medicare Other | Attending: Hematology and Oncology | Admitting: Hematology and Oncology

## 2019-08-30 ENCOUNTER — Other Ambulatory Visit: Payer: Self-pay | Admitting: Internal Medicine

## 2019-08-30 DIAGNOSIS — I4891 Unspecified atrial fibrillation: Secondary | ICD-10-CM

## 2019-09-26 IMAGING — CR DG CHEST 2V
2 series · 2 of 2 positions shown · non-contrast
Comparison: 04/11/2018

CLINICAL DATA: Patient [place] ELSNER from home. Patient has COPD and
asthma. Patient reports he got off the bus at the wrong bus stop and
had to walk home, which exacerbated his symptoms. Bilateral wheezes
throughout heard by EMS.

EXAM:
CHEST - 2 VIEW

[w chest pa]
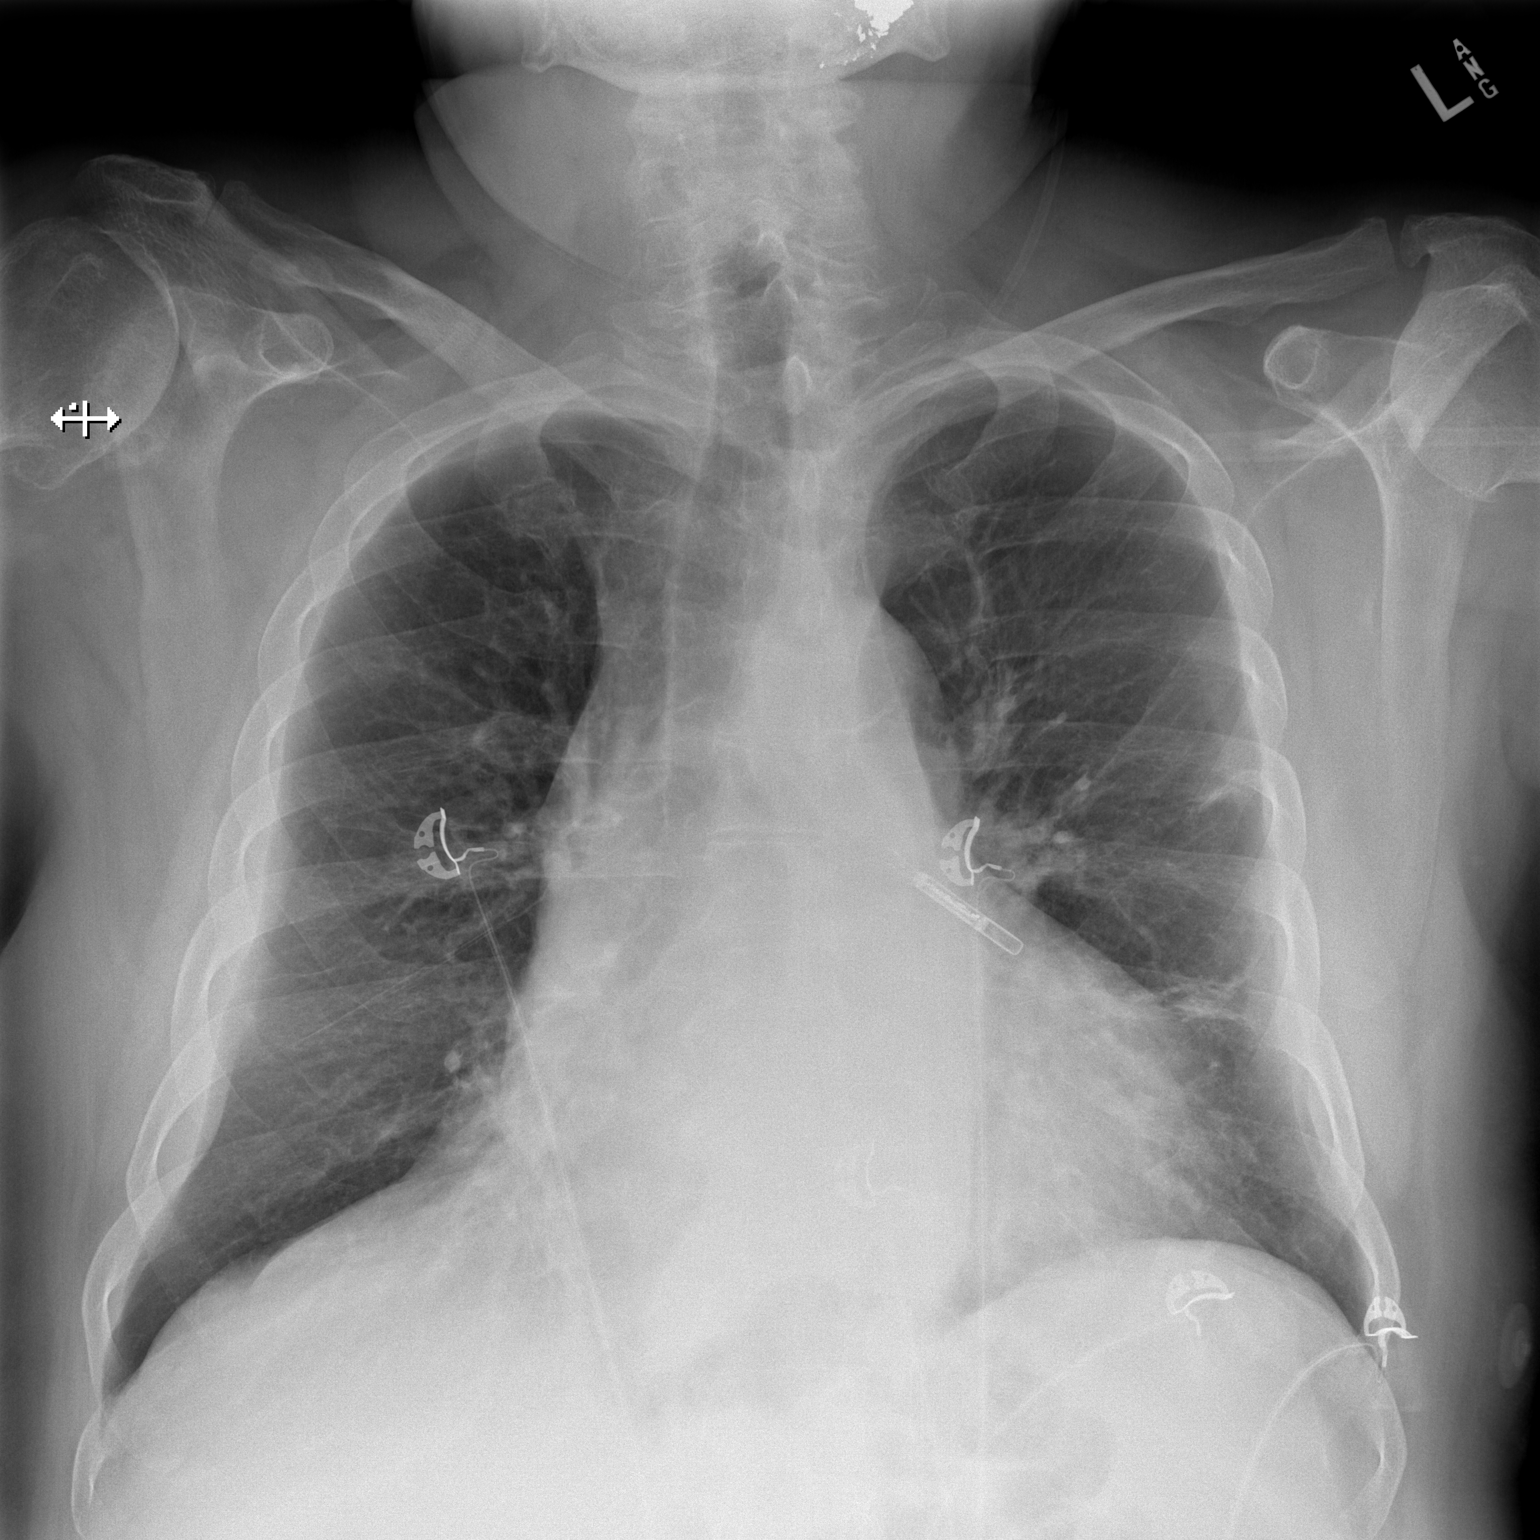

[w chest lat]
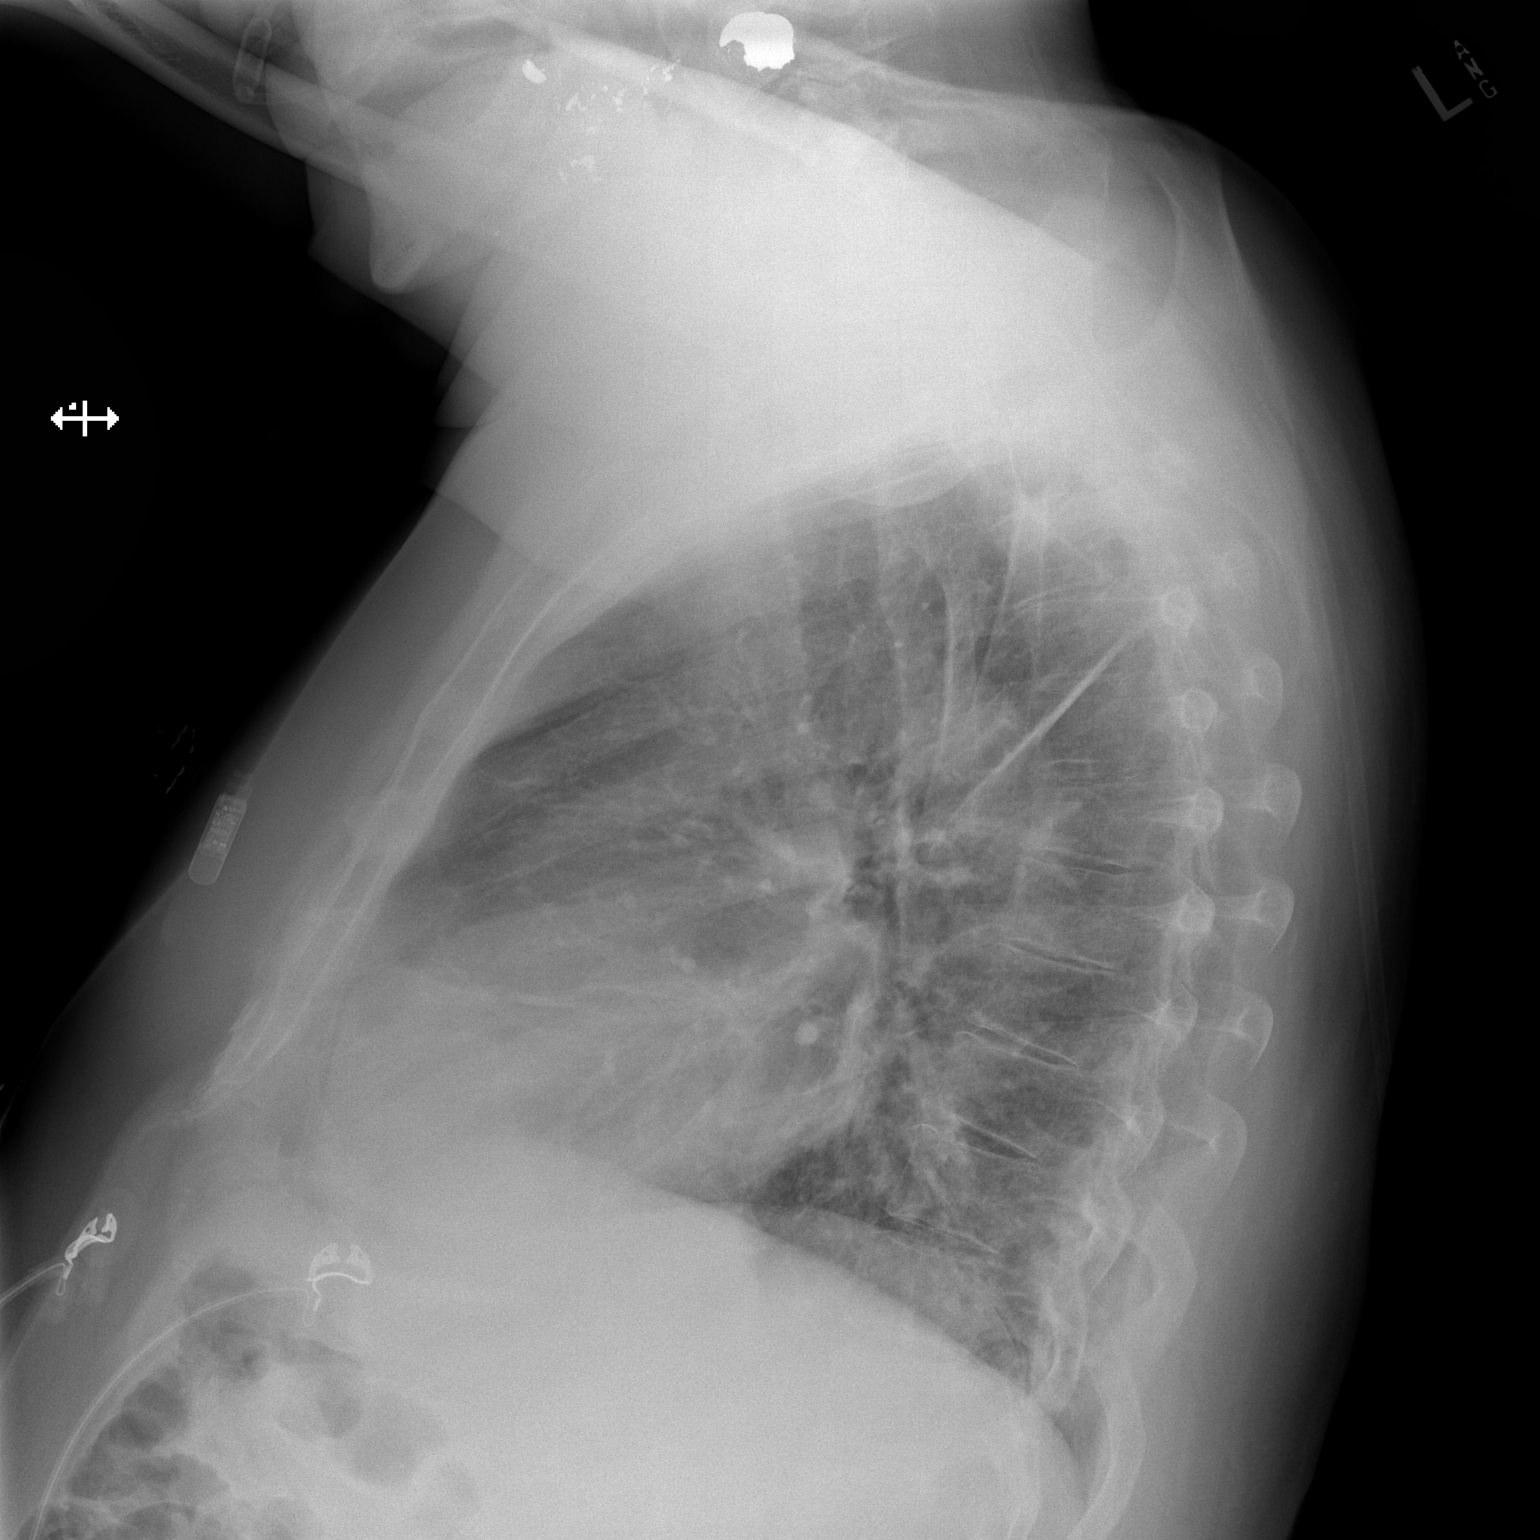

[2 of 2 positions shown; findings below may reference images not displayed]

FINDINGS: Cardiac silhouette is mildly enlarged. No mediastinal or hilar
masses.

There are linear/reticular opacities in the left mid lung, likely
atelectasis. Lungs otherwise clear. No pleural effusion or
pneumothorax.

Skeletal structures are intact.
IMPRESSION: No acute cardiopulmonary disease.

## 2019-09-30 ENCOUNTER — Encounter (HOSPITAL_COMMUNITY): Payer: Self-pay | Admitting: *Deleted

## 2019-09-30 ENCOUNTER — Emergency Department (HOSPITAL_COMMUNITY)
Admission: EM | Admit: 2019-09-30 | Discharge: 2019-10-01 | Disposition: A | Discharge status: Home / Self Care | Payer: Medicare Other | Attending: Emergency Medicine | Admitting: Emergency Medicine

## 2019-09-30 ENCOUNTER — Emergency Department (HOSPITAL_COMMUNITY): Payer: Medicare Other

## 2019-09-30 ENCOUNTER — Other Ambulatory Visit: Payer: Self-pay

## 2019-09-30 DIAGNOSIS — M10032 Idiopathic gout, left wrist: Secondary | ICD-10-CM

## 2019-09-30 DIAGNOSIS — F17210 Nicotine dependence, cigarettes, uncomplicated: Secondary | ICD-10-CM | POA: Insufficient documentation

## 2019-09-30 DIAGNOSIS — N189 Chronic kidney disease, unspecified: Secondary | ICD-10-CM | POA: Insufficient documentation

## 2019-09-30 DIAGNOSIS — I130 Hypertensive heart and chronic kidney disease with heart failure and stage 1 through stage 4 chronic kidney disease, or unspecified chronic kidney disease: Secondary | ICD-10-CM | POA: Insufficient documentation

## 2019-09-30 DIAGNOSIS — I509 Heart failure, unspecified: Secondary | ICD-10-CM | POA: Diagnosis not present

## 2019-09-30 DIAGNOSIS — D471 Chronic myeloproliferative disease: Secondary | ICD-10-CM | POA: Diagnosis not present

## 2019-09-30 DIAGNOSIS — M25532 Pain in left wrist: Secondary | ICD-10-CM | POA: Diagnosis present

## 2019-09-30 DIAGNOSIS — Z79899 Other long term (current) drug therapy: Secondary | ICD-10-CM | POA: Insufficient documentation

## 2019-09-30 DIAGNOSIS — Z7901 Long term (current) use of anticoagulants: Secondary | ICD-10-CM | POA: Diagnosis not present

## 2019-09-30 DIAGNOSIS — J449 Chronic obstructive pulmonary disease, unspecified: Secondary | ICD-10-CM | POA: Diagnosis not present

## 2019-09-30 NOTE — ED Triage Notes (Signed)
The pt is c/i  Pain and swelling in his lt wrist for 2-3 days  No known injury  He has not taken any type medicine for the pain

## 2019-10-01 DIAGNOSIS — M10032 Idiopathic gout, left wrist: Secondary | ICD-10-CM | POA: Diagnosis not present

## 2019-10-01 MED ORDER — PREDNISONE 20 MG PO TABS
40.0000 mg | ORAL_TABLET | Freq: Once | ORAL | Status: AC
  Administered 2019-10-01: 03:00:00 40 mg via ORAL
  Filled 2019-10-01: qty 2

## 2019-10-01 MED ORDER — HYDROMORPHONE HCL 1 MG/ML IJ SOLN
1.0000 mg | Freq: Once | INTRAMUSCULAR | Status: AC
  Administered 2019-10-01: 04:00:00 1 mg via INTRAVENOUS
  Filled 2019-10-01: qty 1

## 2019-10-01 MED ORDER — ACETAMINOPHEN 500 MG PO TABS
1000.0000 mg | ORAL_TABLET | Freq: Once | ORAL | Status: AC
  Administered 2019-10-01: 1000 mg via ORAL
  Filled 2019-10-01: qty 2

## 2019-10-01 MED ORDER — PREDNISONE 20 MG PO TABS: 40.0000 mg | ORAL_TABLET | ORAL | 0 refills | Status: DC

## 2019-10-01 NOTE — Discharge Instructions (Signed)
Please follow-up with your primary care physician for further pain management.  Please take your steroids until complete.

## 2019-10-01 NOTE — ED Provider Notes (Signed)
TIME SEEN: 1:21 AM  CHIEF COMPLAINT: Left wrist pain  HPI: Patient is a 74 year old right-hand-dominant male with history of atrial fibrillation, cardiomyopathy, COPD, hypertension, substance abuse currently on Suboxone, chronic kidney disease, gout who presents to the emergency department with left wrist pain for the past 3 days.  Feels similar to his previous episodes of gout.  Has had gout in this wrist before.  No injury.  No fever.  No chest pain, shortness of breath.  Pain is worse with palpation and movement.  ROS: See HPI Constitutional: no fever  Eyes: no drainage  ENT: no runny nose   Cardiovascular:  no chest pain  Resp: no SOB  GI: no vomiting GU: no dysuria Integumentary: no rash  Allergy: no hives  Musculoskeletal: no leg swelling  Neurological: no slurred speech ROS otherwise negative  PAST MEDICAL HISTORY/PAST SURGICAL HISTORY:  Past Medical History:  Diagnosis Date  . Acute lower GI bleeding 02/15/2017  . Atrial fibrillation ([place])   . Cardiomyopathy    EF55% 11/14<<35%   . CHF (congestive heart failure) ([place])   . COPD (chronic obstructive pulmonary disease) ([place])   . Essential hypertension   . GERD (gastroesophageal reflux disease)   . Gout   . Hepatitis    Possible history  . History of atrial flutter    Ablation 2012  . 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 Obesity   . Pneumonia X 1  . Primary polycythemia ([place]) 06/12/2013  . Renal insufficiency   . Substance abuse ([place])    History of alcohol; hx cocaine, heroin, crack use  . Syncope   . Tubular adenoma of colon 08/2014    MEDICATIONS:  Prior to Admission medications   Medication Sig Start Date End Date Taking? Authorizing Provider  albuterol (PROAIR HFA) 108 (90 Base) MCG/ACT inhaler Inhale 1-2 puffs into the lungs every 6 (six) hours as needed for wheezing or shortness of breath. 01/11/19   [name], MD  albuterol (PROVENTIL) (2.5 MG/3ML) 0.083% nebulizer solution Take 3 mLs (2.5  mg total) by nebulization 2 (two) times daily as needed for wheezing or shortness of breath. 02/13/19   Kayleen Memos, DO  amiodarone (PACERONE) 200 MG tablet TAKE 1/2 TABLET BY MOUTH EVERY DAY Patient taking differently: Take 100 mg by mouth daily.  04/18/19   Baldwin Jamaica, PA-C  camphor-menthol [facility]) lotion Apply topically 3 (three) times daily. Patient taking differently: Apply 1 application topically daily as needed for itching.  02/13/19   Kayleen Memos, DO  carvedilol (COREG) 3.125 MG tablet Take 1 tablet in the morning and 2 tablets in the evening Patient taking differently: Take 3.125-6.25 mg by mouth See admin instructions. Take 1 table (3.125 mg totally) by mouth t in the morning and 2 tablets (6.25 mg totally) by mouth in the evening 04/17/19   Fenton, Clint R, PA  Fluticasone-Umeclidin-Vilant (TRELEGY ELLIPTA) 100-62.5-25 MCG/INH AEPB Inhale 1 puff into the lungs daily. 01/11/19   [name], MD  folic acid (FOLVITE) 1 MG tablet Take 1 tablet (1 mg total) by mouth daily. 09/02/18   [name], MD  furosemide (LASIX) 40 MG tablet Take 1 tablet (40 mg total) by mouth 2 (two) times daily. 06/08/19   Baldwin Jamaica, PA-C  hydroxyurea (HYDREA) 500 MG capsule Take 2 capsules (1,000 mg total) by mouth daily. MAY TAKE WITH FOOD TO MINIMIZE GI SIDE EFFECTS 07/24/19   Marianna Payment, MD  predniSONE (DELTASONE) 20 MG tablet Take 2 tablets (40  mg total) by mouth daily. Take 40 mg by mouth daily for 3 days, then 20mg  by mouth daily for 3 days, then 10mg  daily for 3 days 08/03/19   Montine Circle, PA-C  roflumilast (DALIRESP) 500 MCG TABS tablet Take 1 tablet (500 mcg total) by mouth daily. 07/04/19   Mannam, Hart Robinsons, MD  SUBOXONE 8-2 MG FILM Place 1 Film under the tongue daily.  08/21/15   [provider]  warfarin (COUMADIN) 2.5 MG tablet Take 1 tablet (2.5 mg total) by mouth daily at 6 PM. 08/31/19   Pennie Banter, RPH-CPP    ALLERGIES:  No Known Allergies  SOCIAL HISTORY:   Social History   Tobacco Use  . Smoking status: Current Every Day Smoker    Packs/day: 0.10    Years: 50.00    Pack years: 5.00    Types: Cigarettes  . Smokeless tobacco: Never Used  . Tobacco comment: 1-2 cigarettes a day  Substance Use Topics  . Alcohol use: Yes    Alcohol/week: 89.0 standard drinks    Types: 12 Cans of beer, 77 Shots of liquor per week    Comment: Sometimes.    FAMILY HISTORY: Family History  Problem Relation Age of Onset  . Diabetes Mother   . Hypertension Mother   . Cirrhosis Father   . Alcohol abuse Father   . Colon cancer Neg Hx   . Rectal cancer Neg Hx   . Stomach cancer Neg Hx     EXAM: BP (!) 148/75   Pulse (!) 108   Temp 97.7 F (36.5 C) (Oral)   Resp 14   Ht 5\' 6"  (1.676 m)   Wt 98.9 kg   SpO2 96%   BMI 35.19 kg/m  CONSTITUTIONAL: Alert and oriented and responds appropriately to questions.  Elderly, appears uncomfortable with any movement of the left breast, afebrile, nontoxic HEAD: Normocephalic EYES: Conjunctivae clear, pupils appear equal, EOM appear intact ENT: normal nose; moist mucous membranes NECK: Supple, normal ROM CARD: RRR; S1 and S2 appreciated; no murmurs, no clicks, no rubs, no gallops RESP: Normal chest excursion without splinting or tachypnea; breath sounds clear and equal bilaterally; no wheezes, no rhonchi, no rales, no hypoxia or respiratory distress, speaking full sentences ABD/GI: Normal bowel sounds; non-distended; soft, non-tender, no rebound, no guarding, no peritoneal signs, no hepatosplenomegaly BACK:  The back appears normal EXT: Patient is very tender to palpation diffusely over the left wrist with some soft tissue swelling compared to the right.  No obvious joint effusion.  He has pain with any minimal movement.  He is a 2+ bilateral radial pulses.  Normal capillary refill.  Compartments in the left arm are soft.  No bony deformity.  He does have some redness over the dorsal wrist but no warmth, induration  or fluctuance.  Reports normal sensation throughout the entire left arm. SKIN: Normal color for age and race; warm; no rash on exposed skin NEURO: Moves all extremities equally PSYCH: The patient's mood and manner are appropriate.   MEDICAL DECISION MAKING: Patient here with what appears to be gout of the left wrist.  He has had this before.  Has history of chronic kidney disease therefore we will avoid NSAIDs, colchicine.  X-ray shows no acute abnormality.  No fever.  I do not think he has septic arthritis.  No sign of arterial obstruction, DVT, compartment syndrome.  I do not think this is his anginal equivalent.  He is not a diabetic.  Started on a prednisone taper  which has helped him in the past.  He is on Suboxone.  Given Dilaudid here which significantly improved his pain but discussed with patient that he will need to follow-up with his outpatient physician for further pain management given he is on Suboxone.  He verbalized understanding.  His wife will pick him up from the emergency department.  At this time, I do not feel there is any life-threatening condition present. I have reviewed, interpreted and discussed all results (EKG, imaging, lab, urine as appropriate) and exam findings with patient/family. I have reviewed nursing notes and appropriate previous records.  I feel the patient is safe to be discharged home without further emergent workup and can continue workup as an outpatient as needed. Discussed usual and customary return precautions. Patient/family verbalize understanding and are comfortable with this plan.  Outpatient follow-up has been provided as needed. All questions have been answered.   [name] was evaluated in Emergency Department on 10/01/2019 for the symptoms described in the history of present illness. He was evaluated in the context of the global COVID-19 pandemic, which necessitated consideration that the patient might be at risk for infection with the  SARS-CoV-2 virus that causes COVID-19. Institutional protocols and algorithms that pertain to the evaluation of patients at risk for COVID-19 are in a state of rapid change based on information released by regulatory bodies including the CDC and federal and state organizations. These policies and algorithms were followed during the patient's care in the ED.  Patient was seen wearing N95, face shield, gloves.    Vito Beg, Delice Bison, DO 10/01/19 [phone]

## 2019-10-04 ENCOUNTER — Telehealth: Payer: Self-pay

## 2019-10-04 ENCOUNTER — Ambulatory Visit (INDEPENDENT_AMBULATORY_CARE_PROVIDER_SITE_OTHER): Payer: Medicare Other | Admitting: Gastroenterology

## 2019-10-04 ENCOUNTER — Encounter: Payer: Self-pay | Admitting: Gastroenterology

## 2019-10-04 ENCOUNTER — Other Ambulatory Visit: Payer: Self-pay

## 2019-10-04 VITALS — BP 106/60 | HR 78 | Temp 97.3°F | Ht 66.0 in | Wt 218.0 lb

## 2019-10-04 DIAGNOSIS — Z86010 Personal history of colonic polyps: Secondary | ICD-10-CM | POA: Diagnosis not present

## 2019-10-04 DIAGNOSIS — Z7901 Long term (current) use of anticoagulants: Secondary | ICD-10-CM

## 2019-10-04 NOTE — Patient Instructions (Signed)
It has been recommended to you by your physician that you have a(n) Colonoscopy completed. Per your request, we did not schedule the procedure(s) today. Please contact our office at [phone] should you decide to have the procedure completed. You will be scheduled for a pre-visit and procedure at that time.  Thank you for choosing me and [facility].  Malcolm T. Stark, Jr., MD., FACG  

## 2019-10-04 NOTE — Telephone Encounter (Deleted)
Patient with diagnosis of atrial fibrillation on warfarin for anticoagulation.    Procedure: colonoscopy Date of procedure: TBD  CHADS2-VASc score of 3 (CHF, HTN, AGE)  CrCl 58 Platelet count 128  Per office protocol, patient can hold warfarin for 5 days prior to procedure.    Patient will not need bridging with Lovenox (enoxaparin) around procedure.

## 2019-10-04 NOTE — Progress Notes (Signed)
History of Present Illness: This is a 74 year old male with a personal history of adenomatous colon polyps, internal hemorrhoids.  He has no ongoing gastrointestinal complaints.  He recovering from a flare of gout in his left wrist.  He has a history of COPD with exacerbations requiring hospitalization last in October 2020.  He has a history of CHF with an EF of 35 to 40%.  He relates that his cardiopulmonary problems have been stable for several months and he feels well.  He is maintained on Coumadin for history of A. fib.  Denies weight loss, abdominal pain, constipation, diarrhea, change in stool caliber, melena, hematochezia, nausea, vomiting, dysphagia, reflux symptoms, chest pain.     No Known Allergies Outpatient Medications Prior to Visit  Medication Sig Dispense Refill  . albuterol (PROAIR HFA) 108 (90 Base) MCG/ACT inhaler Inhale 1-2 puffs into the lungs every 6 (six) hours as needed for wheezing or shortness of breath. 18 g 3  . albuterol (PROVENTIL) (2.5 MG/3ML) 0.083% nebulizer solution Take 3 mLs (2.5 mg total) by nebulization 2 (two) times daily as needed for wheezing or shortness of breath. 75 mL 0  . amiodarone (PACERONE) 200 MG tablet TAKE 1/2 TABLET BY MOUTH EVERY DAY (Patient taking differently: Take 100 mg by mouth daily. ) 45 tablet 3  . camphor-menthol (SARNA) lotion Apply topically 3 (three) times daily. (Patient taking differently: Apply 1 application topically daily as needed for itching. ) 222 mL 0  . carvedilol (COREG) 3.125 MG tablet Take 1 tablet in the morning and 2 tablets in the evening (Patient taking differently: Take 3.125-6.25 mg by mouth See admin instructions. Take 1 table (3.125 mg totally) by mouth t in the morning and 2 tablets (6.25 mg totally) by mouth in the evening) 270 tablet 2  . Fluticasone-Umeclidin-Vilant (TRELEGY ELLIPTA) 100-62.5-25 MCG/INH AEPB Inhale 1 puff into the lungs daily. 60 each 5  . folic acid (FOLVITE) 1 MG tablet Take 1 tablet (1  mg total) by mouth daily. 90 tablet 3  . furosemide (LASIX) 40 MG tablet Take 1 tablet (40 mg total) by mouth 2 (two) times daily. 180 tablet 2  . hydroxyurea (HYDREA) 500 MG capsule Take 2 capsules (1,000 mg total) by mouth daily. MAY TAKE WITH FOOD TO MINIMIZE GI SIDE EFFECTS 180 capsule 3  . predniSONE (DELTASONE) 20 MG tablet Take 2 tablets (40 mg total) by mouth See admin instructions. Take 40 mg (2 tablets) x 2 days then 30 mg (1.5 tablet) x 2 days then 20 mg (1 tablet) x 2 days then 10 mg (0.5 tablet)  x 2 days then stop 10 tablet 0  . roflumilast (DALIRESP) 500 MCG TABS tablet Take 1 tablet (500 mcg total) by mouth daily. 30 tablet 11  . SUBOXONE 8-2 MG FILM Place 1 Film under the tongue daily.     [name] warfarin (COUMADIN) 2.5 MG tablet Take 1 tablet (2.5 mg total) by mouth daily at 6 PM. 30 tablet 2   No facility-administered medications prior to visit.   Past Medical History:  Diagnosis Date  . Acute lower GI bleeding 02/15/2017  . Atrial fibrillation ([place])   . Cardiomyopathy    EF55% 11/14<<35%   . CHF (congestive heart failure) ([place])   . COPD (chronic obstructive pulmonary disease) ([place])   . Essential hypertension   . GERD (gastroesophageal reflux disease)   . Gout   . Hepatitis    Possible history  . History of atrial flutter  Ablation 2012  . 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 Obesity   . Pneumonia X 1  . 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 Renal insufficiency   . Substance abuse ([place])    History of alcohol; hx cocaine, heroin, crack use  . Syncope   . Tubular adenoma of colon 08/2014   Past Surgical History:  Procedure Laterality Date  . CARDIAC ELECTROPHYSIOLOGY MAPPING AND ABLATION  08/2010   [name] 09/07/2010 (12/14/2012)  . COLONOSCOPY     15-20 years ago had colon in [place]  . EXCISIONAL HEMORRHOIDECTOMY  1970's  . LOOP RECORDER IMPLANT N/A 08/23/2013   Procedure: LOOP RECORDER IMPLANT;  Surgeon: Deboraha Sprang, MD;  Location: [facility] CATH LAB;   Service: Cardiovascular;  Laterality: N/A;  . MULTIPLE EXTRACTIONS WITH ALVEOLOPLASTY Bilateral 01/24/2016   Procedure: MULTIPLE EXTRACTION WITH ALVEOLOPLASTY BILATERAL;  Surgeon: Diona Browner, DDS;  Location: [facility];  Service: Oral Surgery;  Laterality: Bilateral;  . MULTIPLE TOOTH EXTRACTIONS  01/24/2016   MULTIPLE EXTRACTION WITH ALVEOLOPLASTY BILATERAL (Bilateral)   Social History   Socioeconomic History  . Marital status: Single    Spouse name: Not on file  . Number of children: Not on file  . Years of education: Not on file  . Highest education level: Not on file  Occupational History  . Occupation: Retired  Tobacco Use  . Smoking status: Current Every Day Smoker    Packs/day: 0.10    Years: 50.00    Pack years: 5.00    Types: Cigarettes  . Smokeless tobacco: Never Used  . Tobacco comment: 1-2 cigarettes a day  Substance and Sexual Activity  . Alcohol use: Yes    Alcohol/week: 89.0 standard drinks    Types: 12 Cans of beer, 77 Shots of liquor per week    Comment: Sometimes.  . Drug use: Not Currently    Types: "Crack" cocaine, Cocaine, Heroin    Comment: 02/15/2017 "last used crack 4-5 years ago; last used heroin a couple years ago; currently denies any drug use-- on suboxone.   . Sexual activity: Not Currently  Other Topics Concern  . Not on file  Social History Narrative   Moved here from [place]. Lives with common law wife and grandchildren. He is retired from "general labor."   Social Determinants of Radio broadcast assistant Strain:   . Difficulty of Paying Living Expenses:   Food Insecurity:   . Worried About Charity fundraiser in the Last Year:   . Arboriculturist in the Last Year:   Transportation Needs:   . Film/video editor (Medical):   [name] Lack of Transportation (Non-Medical):   Physical Activity:   . Days of Exercise per Week:   . Minutes of Exercise per Session:   Stress:   . Feeling of Stress :   Social Connections:   . Frequency of  Communication with Friends and Family:   . Frequency of Social Gatherings with Friends and Family:   . Attends Religious Services:   . Active Member of Clubs or Organizations:   . Attends Archivist Meetings:   [name] Marital Status:    Family History  Problem Relation Age of Onset  . Diabetes Mother   . Hypertension Mother   . Cirrhosis Father   . Alcohol abuse Father   . Colon cancer Neg Hx   . Rectal cancer Neg Hx   . Stomach cancer Neg Hx        Physical Exam: General: Well  developed, well nourished, obese, no acute distress Head: Normocephalic and atraumatic Eyes:  sclerae anicteric, EOMI Ears: Normal auditory acuity Mouth: Not examined, mask on during Covid-19 pandemic Lungs: Clear throughout to auscultation Heart: Regular rate and rhythm; no murmurs, rubs or bruits Abdomen: Soft, non tender and non distended. No masses, hepatosplenomegaly or hernias noted. Normal Bowel sounds Rectal: Deferred to colonoscopy Musculoskeletal: Symmetrical with no gross deformities  Pulses:  Normal pulses noted Extremities: No clubbing, cyanosis, edema or deformities noted. Left wrist brace. Neurological: Alert oriented x 4, grossly nonfocal Psychological:  Alert and cooperative. Normal mood and affect   Assessment and Recommendations:  1. Personal history of adenomatous colon polyps.  He had 3 adenomatous colon polyps less than 1 cm on his last colonoscopy in February 2016. The risks (including bleeding, perforation, infection, missed lesions, medication reactions and possible hospitalization or surgery if complications occur), benefits, and alternatives to colonoscopy with possible biopsy and possible polypectomy were discussed with the patient and they consent to proceed.   2.  History of internal hemorrhoids with hemorrhoidal bleeding per anoscopy in 2018.  3. Afib. Hold Coumadin 5 days before procedure - will instruct when and how to resume after procedure. Low but real risk of  cardiovascular event such as heart attack, stroke, embolism, thrombosis or ischemia/infarct of other organs off Coumadin explained and need to seek urgent help if this occurs. The patient consents to proceed. Will communicate by phone or EMR with patient's prescribing provider to confirm that holding Coumadin is reasonable in this case.   4.  CHF    5. COPD

## 2019-10-04 NOTE — Telephone Encounter (Signed)
Patient with diagnosis of atrial fibrillation on warfarin for anticoagulation.    Procedure: colonoscopy Date of procedure: TBD  CHADS2-VASc score of 3 (CHF, HTN, AGE)  CrCl 58 Platelet count 128  Per office protocol, patient can hold warfarin for 5 days prior to procedure.    Will route note to Paulla Dolly who manages patient's warfarin as an FYI regarding his 5 day hold. Pt's last INR check was in December 2020 and he missed f/u appt in January 2021 and has not been seen since then. He would be a good candidate for Eliquis since Medicaid covers DOACs well and would not have to worry about missing follow up appts.

## 2019-10-04 NOTE — Telephone Encounter (Signed)
[facility] Pre-operative Risk Assessment     Request for surgical clearance:     Endoscopy Procedure  What type of surgery is being performed?     Colonoscopy  When is this surgery scheduled?     TBD  What type of clearance is required ?   Pharmacy  Are there any medications that need to be held prior to surgery and how long? Warfarin x 5 days  Practice name and name of physician performing surgery?      [facility]  What is your office phone and fax number?      Phone- [phone]  [phone]  Anesthesia type (None, local, MAC, general) ?       MAC

## 2019-10-10 NOTE — Telephone Encounter (Signed)
   Primary Cardiologist: Dr. Lovena Le  Chart reviewed as part of pre-operative protocol. Patient has upcoming colonoscopy planned (date to be determined) and we were asked to give our recommendations on holding Coumadin.   Per Pharmacy: CHADS2-VASc score of 3 (CHF, HTN, AGE) CrCl 58 Platelet count 128  Per office protocol, patient can hold warfarin for 5 days prior to procedure.  I will route this recommendation to the requesting party via Epic fax function and remove from pre-op pool.  Please call with questions.  Darreld Mclean, PA-C 10/10/2019, 5:00 PM

## 2019-10-25 ENCOUNTER — Telehealth: Payer: Self-pay | Admitting: Pharmacist

## 2019-10-25 NOTE — Telephone Encounter (Signed)
Previously received clearance request for patient to hold warfarin for colonoscopy (10/04/19 phone encounter). It was noted that pt had not had his INR checked since 07/10/19. I have sent 2 messages to the internal med clinic who follows patient's warfarin as there was no documentation pt was reached to schedule follow up and he is 4 months overdue for an INR check. He would benefit from changing to Eliquis due to better efficacy and safety parameters as well as decreased need for monitoring. He has Medicaid which covers Eliquis for $8.95 for a 3 month supply. I never heard a response back from their anticoag clinic and pt still does not have any follow up scheduled.  I called pt to transition him from warfarin to Eliquis at our office instead. Pt states he is interested in changing to Eliquis and that copay is affordable. Will need visit in office to check INR and educate pt on new anticoagulant. Appt has been made for this Friday.

## 2019-10-27 ENCOUNTER — Ambulatory Visit (INDEPENDENT_AMBULATORY_CARE_PROVIDER_SITE_OTHER): Payer: Medicare Other | Admitting: Pharmacist

## 2019-10-27 ENCOUNTER — Other Ambulatory Visit: Payer: Self-pay

## 2019-10-27 DIAGNOSIS — I4819 Other persistent atrial fibrillation: Secondary | ICD-10-CM

## 2019-10-27 LAB — CBC
Hematocrit: 36.5 % — ABNORMAL LOW (ref 37.5–51.0)
Hemoglobin: 13 g/dL (ref 13.0–17.7)
MCH: 41.1 pg — ABNORMAL HIGH (ref 26.6–33.0)
MCHC: 35.6 g/dL (ref 31.5–35.7)
MCV: 116 fL — ABNORMAL HIGH (ref 79–97)
Platelets: 196 10*3/uL (ref 150–450)
RBC: 3.16 x10E6/uL — ABNORMAL LOW (ref 4.14–5.80)
RDW: 12.6 % (ref 11.6–15.4)
WBC: 4.8 10*3/uL (ref 3.4–10.8)

## 2019-10-27 LAB — BASIC METABOLIC PANEL
BUN/Creatinine Ratio: 15 (ref 10–24)
BUN: 21 mg/dL (ref 8–27)
CO2: 23 mmol/L (ref 20–29)
Calcium: 8.5 mg/dL — ABNORMAL LOW (ref 8.6–10.2)
Chloride: 108 mmol/L — ABNORMAL HIGH (ref 96–106)
Creatinine, Ser: 1.36 mg/dL — ABNORMAL HIGH (ref 0.76–1.27)
GFR calc Af Amer: 60 mL/min/{1.73_m2} (ref >59)
GFR calc non Af Amer: 52 mL/min/{1.73_m2} — ABNORMAL LOW (ref >59)
Glucose: 123 mg/dL — ABNORMAL HIGH (ref 65–99)
Potassium: 4.5 mmol/L (ref 3.5–5.2)
Sodium: 143 mmol/L (ref 134–144)

## 2019-10-27 MED ORDER — APIXABAN 5 MG PO TABS: 5.0000 mg | ORAL_TABLET | Freq: Two times a day (BID) | ORAL | 1 refills | Status: DC

## 2019-10-27 NOTE — Progress Notes (Signed)
Pt presents today to change from warfarin to Eliquis. Pt on anticoagulation for afib, also has history of GI bleed and would benefit from anticoagulant with lower bleed risk such as Eliquis.  Will check baseline CBC and BMET today. Confirmed 3 month supply of Eliquis is $4 at his pharmacy. They will remove warfarin from his medication list.  Reviewed patients medication list.  Pt is not currently on any combined P-gp and strong CYP3A4 inhibitors/inducers (ketoconazole, traconazole, ritonavir, carbamazepine, phenytoin, rifampin, St. John's wort).  Reviewed labs. Hgb and HCT WNL. SCr 1.27, Weight 98.9kg, age 74. INR checked today and is 1.4. Will start Eliquis 5mg  BID.  A full discussion of the nature of anticoagulants has been carried out.  A benefit/risk analysis has been presented to the patient, so that they understand the justification for choosing anticoagulation with Eliquis at this time.  The need for compliance is stressed.  Pt is aware to take the medication twice daily.  Side effects of potential bleeding are discussed, including unusual colored urine or stools, coughing up blood or coffee ground emesis, nose bleeds or serious fall or head trauma.  Discussed signs and symptoms of stroke. The patient should avoid any OTC items containing aspirin or ibuprofen.  Avoid alcohol consumption.   Call if any signs of abnormal bleeding.  Discussed financial obligations and resolved any difficulty in obtaining medication.

## 2019-10-27 NOTE — Patient Instructions (Signed)
Stop taking your warfarin (Coumadin)  Start taking Eliquis 5mg  - 1 tablet in the morning and 1 tablet at night (space your doses apart every 12 hours)

## 2019-11-02 ENCOUNTER — Telehealth: Payer: Self-pay | Admitting: Gastroenterology

## 2019-11-02 ENCOUNTER — Telehealth: Payer: Self-pay

## 2019-11-02 NOTE — Telephone Encounter (Signed)
[facility] Pre-operative Risk Assessment     Request for surgical clearance:     Endoscopy Procedure  What type of surgery is being performed?     Colonoscopy  When is this surgery scheduled?     TBD  What type of clearance is required ?   Pharmacy  Are there any medications that need to be held prior to surgery and how long? Eliquis x 2 days  Practice name and name of physician performing surgery?      [facility]  What is your office phone and fax number?      Phone- [phone]  [phone]  Anesthesia type (None, local, MAC, general) ?       MAC

## 2019-11-02 NOTE — Telephone Encounter (Signed)
Patient is not calling for lab results, but she is calling to schedule colonoscopy.  Patient was recently switched from Warfarin to Eliquis.  [name] will you ask cardiology to please request clearance for Eliquis hold .

## 2019-11-02 NOTE — Telephone Encounter (Signed)
Pharmacy, can you please how long patient can hold Eliquis for upcoming colonoscopy?  Thank you!

## 2019-11-02 NOTE — Telephone Encounter (Signed)
    Patient calling for lab results 

## 2019-11-02 NOTE — Telephone Encounter (Signed)
Routed request for Eliquis clearance to Cardiology.

## 2019-11-03 NOTE — Telephone Encounter (Signed)
Informed patient per Cardiology he can hold Eliquis 2 days prior to procedure that is now scheduled for 12/12/19. Patient verbalized understanding.

## 2019-11-03 NOTE — Telephone Encounter (Signed)
Patient with diagnosis of afib on Eliquis for anticoagulation.    Procedure: Colonoscopy Date of procedure: TBD  CHADS2-VASc score of  3 (CHF, HTN, AGE)  CrCl 53 ml/min  Per office protocol, patient can hold Eliquis for 2 days prior to procedure.

## 2019-11-03 NOTE — Telephone Encounter (Signed)
Patient scheduled for Colon on 12/12/19 at 9:00am and nurse visit on 12/04/19 at 9:00am. Informed patient per Cardiology he can hold Eliquis 2 days prior to procedure. Patient verbalized understanding.

## 2019-11-03 NOTE — Telephone Encounter (Signed)
   Primary Cardiologist: Dr. Cristopher Peru  Chart reviewed as part of pre-operative protocol coverage. Patient has upcoming colonoscopy planned and we were asked to give our recommendations on holding Eliquis.   Per Pharmacy and office protocol, "patient can hold Eliquis for 2 days prior to procedure." This should be restarted as soon as able following colonoscopy.   I will route this recommendation to the requesting party via Epic fax function and remove from pre-op pool.  Please call with questions.  Darreld Mclean, PA-C 11/03/2019, 8:10 AM

## 2019-11-22 ENCOUNTER — Telehealth: Payer: Self-pay | Admitting: *Deleted

## 2019-11-22 NOTE — Telephone Encounter (Signed)
[place],  Due to his being a difficult airway his procedure cannot be done at [facility].  Thanks,  Osvaldo Angst

## 2019-11-22 NOTE — Telephone Encounter (Signed)
Patient is scheduled at [facility] for 7/26 10:15.  He will need to arrive at 8:45 at [facility].  He will need COVID screen on 7/22 9:30 at the [facility].

## 2019-11-22 NOTE — Telephone Encounter (Signed)
Please schedule at [facility]

## 2019-11-22 NOTE — Telephone Encounter (Signed)
The patient is scheduled for colonoscopy at [facility] on 12/12/19. Patient had OV on 10/04/19.  The patient's chart is flagged "difficuly airway", please advise. Can the patient have colon at [facility]? Thanks, [name] PV

## 2019-11-22 NOTE — Telephone Encounter (Signed)
I spoke with the patient. Notified him of the new appointment date for colonoscopy at [facility]. Also reschedule nurse visit for closer to that date in July. Pt is aware of this date. Also mailed appointment letter to pt.

## 2019-12-07 ENCOUNTER — Encounter: Payer: Medicare Other | Admitting: Gastroenterology

## 2019-12-12 ENCOUNTER — Encounter: Payer: Medicare Other | Admitting: Gastroenterology

## 2020-01-29 ENCOUNTER — Ambulatory Visit (AMBULATORY_SURGERY_CENTER): Payer: Self-pay

## 2020-01-29 ENCOUNTER — Other Ambulatory Visit: Payer: Self-pay

## 2020-01-29 VITALS — Ht 66.0 in | Wt 199.4 lb

## 2020-01-29 DIAGNOSIS — Z01818 Encounter for other preprocedural examination: Secondary | ICD-10-CM

## 2020-01-29 DIAGNOSIS — Z86010 Personal history of colonic polyps: Secondary | ICD-10-CM

## 2020-01-29 MED ORDER — NA SULFATE-K SULFATE-MG SULF 17.5-3.13-1.6 GM/177ML PO SOLN: 1.0000 | Freq: Once | ORAL | 0 refills | Status: AC

## 2020-01-29 NOTE — Progress Notes (Signed)
No egg or soy allergy known to patient  Per Osvaldo Angst, CRNA-patient is a difficult airway and has been scheduled at [facility] for his procedure;  No diet pills per patient No home 02 use per patient  No blood thinners per patient  Pt denies issues with constipation  No A fib or A flutter   COVID 19 guidelines implemented in PV today   Eliquis to be held for 2 days prior to procedure per TE notation-patient is aware and verbalized understanding;   COVID screening has been scheduled on 02/08/2020 at 9:30 am at the [facility] location; patient is aware of testing date/time;   Due to the COVID-19 pandemic we are asking patients to follow these guidelines. Please only bring one care partner. Please be aware that your care partner may wait in the car in the parking lot or if they feel like they will be too hot to wait in the car, they may wait in the lobby on the 4th floor. All care partners are required to wear a mask the entire time (we do not have any that we can provide them), they need to practice social distancing, and we will do a Covid check for all patient's and care partners when you arrive. Also we will check their temperature and your temperature. If the care partner waits in their car they need to stay in the parking lot the entire time and we will call them on their cell phone when the patient is ready for discharge so they can bring the car to the front of the building. Also all patient's will need to wear a mask into building.

## 2020-02-08 ENCOUNTER — Other Ambulatory Visit (HOSPITAL_COMMUNITY)
Admission: RE | Admit: 2020-02-08 | Discharge: 2020-02-08 | Disposition: A | Discharge status: Home / Self Care | Payer: Medicare Other | Source: Ambulatory Visit | Attending: Gastroenterology | Admitting: Gastroenterology

## 2020-02-08 DIAGNOSIS — Z01812 Encounter for preprocedural laboratory examination: Secondary | ICD-10-CM | POA: Insufficient documentation

## 2020-02-08 DIAGNOSIS — Z20822 Contact with and (suspected) exposure to covid-19: Secondary | ICD-10-CM | POA: Diagnosis not present

## 2020-02-08 LAB — SARS CORONAVIRUS 2 (TAT 6-24 HRS): SARS Coronavirus 2: NEGATIVE

## 2020-02-09 NOTE — Progress Notes (Signed)
Spoke with patient re: Monday appt for colonoscopy. Patient was told to stop Eliquis 2 days prior to procedure.  Reminded patient time to be at facility, NPO, and to drink prep as directed. Patient understood and had no questions.

## 2020-02-11 ENCOUNTER — Encounter (HOSPITAL_COMMUNITY): Payer: Self-pay | Admitting: Gastroenterology

## 2020-02-12 ENCOUNTER — Encounter (HOSPITAL_COMMUNITY): Payer: Self-pay | Admitting: Gastroenterology

## 2020-02-12 ENCOUNTER — Encounter (HOSPITAL_COMMUNITY)
Admission: RE | Disposition: A | Discharge status: Home / Self Care | Payer: Self-pay | Source: Home / Self Care | Attending: Gastroenterology

## 2020-02-12 ENCOUNTER — Ambulatory Visit (HOSPITAL_COMMUNITY)
Admission: RE | Admit: 2020-02-12 | Discharge: 2020-02-12 | Disposition: A | Discharge status: Home / Self Care | Payer: Medicare Other | Attending: Gastroenterology | Admitting: Gastroenterology

## 2020-02-12 ENCOUNTER — Ambulatory Visit (HOSPITAL_COMMUNITY): Payer: Medicare Other | Admitting: Anesthesiology

## 2020-02-12 ENCOUNTER — Other Ambulatory Visit: Payer: Self-pay

## 2020-02-12 DIAGNOSIS — Z1211 Encounter for screening for malignant neoplasm of colon: Secondary | ICD-10-CM | POA: Insufficient documentation

## 2020-02-12 DIAGNOSIS — E669 Obesity, unspecified: Secondary | ICD-10-CM | POA: Diagnosis not present

## 2020-02-12 DIAGNOSIS — Z79899 Other long term (current) drug therapy: Secondary | ICD-10-CM | POA: Diagnosis not present

## 2020-02-12 DIAGNOSIS — Z86010 Personal history of colonic polyps: Secondary | ICD-10-CM

## 2020-02-12 DIAGNOSIS — D124 Benign neoplasm of descending colon: Secondary | ICD-10-CM | POA: Diagnosis not present

## 2020-02-12 DIAGNOSIS — Z6835 Body mass index (BMI) 35.0-35.9, adult: Secondary | ICD-10-CM | POA: Insufficient documentation

## 2020-02-12 DIAGNOSIS — D122 Benign neoplasm of ascending colon: Secondary | ICD-10-CM | POA: Insufficient documentation

## 2020-02-12 DIAGNOSIS — I4891 Unspecified atrial fibrillation: Secondary | ICD-10-CM | POA: Diagnosis not present

## 2020-02-12 DIAGNOSIS — J449 Chronic obstructive pulmonary disease, unspecified: Secondary | ICD-10-CM | POA: Diagnosis not present

## 2020-02-12 DIAGNOSIS — K5730 Diverticulosis of large intestine without perforation or abscess without bleeding: Secondary | ICD-10-CM | POA: Diagnosis not present

## 2020-02-12 DIAGNOSIS — I509 Heart failure, unspecified: Secondary | ICD-10-CM | POA: Diagnosis not present

## 2020-02-12 DIAGNOSIS — D120 Benign neoplasm of cecum: Secondary | ICD-10-CM

## 2020-02-12 DIAGNOSIS — M109 Gout, unspecified: Secondary | ICD-10-CM | POA: Diagnosis not present

## 2020-02-12 DIAGNOSIS — F17210 Nicotine dependence, cigarettes, uncomplicated: Secondary | ICD-10-CM | POA: Insufficient documentation

## 2020-02-12 DIAGNOSIS — K640 First degree hemorrhoids: Secondary | ICD-10-CM | POA: Insufficient documentation

## 2020-02-12 DIAGNOSIS — D125 Benign neoplasm of sigmoid colon: Secondary | ICD-10-CM | POA: Diagnosis not present

## 2020-02-12 DIAGNOSIS — I110 Hypertensive heart disease with heart failure: Secondary | ICD-10-CM | POA: Diagnosis not present

## 2020-02-12 DIAGNOSIS — Z7901 Long term (current) use of anticoagulants: Secondary | ICD-10-CM | POA: Diagnosis not present

## 2020-02-12 DIAGNOSIS — I429 Cardiomyopathy, unspecified: Secondary | ICD-10-CM | POA: Diagnosis not present

## 2020-02-12 DIAGNOSIS — K219 Gastro-esophageal reflux disease without esophagitis: Secondary | ICD-10-CM | POA: Diagnosis not present

## 2020-02-12 DIAGNOSIS — D123 Benign neoplasm of transverse colon: Secondary | ICD-10-CM

## 2020-02-12 HISTORY — PX: POLYPECTOMY: SHX5525

## 2020-02-12 HISTORY — PX: COLONOSCOPY WITH PROPOFOL: SHX5780

## 2020-02-12 SURGERY — COLONOSCOPY WITH PROPOFOL
Anesthesia: Monitor Anesthesia Care

## 2020-02-12 MED ORDER — GLYCOPYRROLATE 0.2 MG/ML IJ SOLN
INTRAMUSCULAR | Status: DC | PRN
  Administered 2020-02-12: .2 mg via INTRAVENOUS

## 2020-02-12 MED ORDER — PROPOFOL 500 MG/50ML IV EMUL
INTRAVENOUS | Status: AC
  Filled 2020-02-12: qty 50

## 2020-02-12 MED ORDER — PROPOFOL 500 MG/50ML IV EMUL
INTRAVENOUS | Status: DC | PRN
  Administered 2020-02-12: 110 ug/kg/min via INTRAVENOUS

## 2020-02-12 MED ORDER — PROPOFOL 10 MG/ML IV BOLUS
INTRAVENOUS | Status: AC
  Filled 2020-02-12: qty 40

## 2020-02-12 MED ORDER — PROPOFOL 500 MG/50ML IV EMUL
INTRAVENOUS | Status: DC | PRN
  Administered 2020-02-12: 30 mg via INTRAVENOUS

## 2020-02-12 MED ORDER — LACTATED RINGERS IV SOLN
INTRAVENOUS | Status: DC
  Administered 2020-02-12: 1000 mL via INTRAVENOUS

## 2020-02-12 SURGICAL SUPPLY — 21 items

## 2020-02-12 NOTE — Discharge Instructions (Signed)
YOU HAD AN ENDOSCOPIC PROCEDURE TODAY: Refer to the procedure report and other information in the discharge instructions given to you for any specific questions about what was found during the examination. If this information does not answer your questions, please call [place] office at [phone] to clarify.  ° °YOU SHOULD EXPECT: Some feelings of bloating in the abdomen. Passage of more gas than usual. Walking can help get rid of the air that was put into your GI tract during the procedure and reduce the bloating. If you had a lower endoscopy (such as a colonoscopy or flexible sigmoidoscopy) you may notice spotting of blood in your stool or on the toilet paper. Some abdominal soreness may be present for a day or two, also. ° °DIET: Your first meal following the procedure should be a light meal and then it is ok to progress to your normal diet. A half-sandwich or bowl of soup is an example of a good first meal. Heavy or fried foods are harder to digest and may make you feel nauseous or bloated. Drink plenty of fluids but you should avoid alcoholic beverages for 24 hours. If you had a esophageal dilation, please see attached instructions for diet.   ° °ACTIVITY: Your care partner should take you home directly after the procedure. You should plan to take it easy, moving slowly for the rest of the day. You can resume normal activity the day after the procedure however YOU SHOULD NOT DRIVE, use power tools, machinery or perform tasks that involve climbing or major physical exertion for 24 hours (because of the sedation medicines used during the test).  ° °SYMPTOMS TO REPORT IMMEDIATELY: °A gastroenterologist can be reached at any hour. Please call [phone]  for any of the following symptoms:  °Following lower endoscopy (colonoscopy, flexible sigmoidoscopy) °Excessive amounts of blood in the stool  °Significant tenderness, worsening of abdominal pains  °Swelling of the abdomen that is new, acute  °Fever of 100° or  higher  °Following upper endoscopy (EGD, EUS, ERCP, esophageal dilation) °Vomiting of blood or coffee ground material  °New, significant abdominal pain  °New, significant chest pain or pain under the shoulder blades  °Painful or persistently difficult swallowing  °New shortness of breath  °Black, tarry-looking or red, bloody stools ° °FOLLOW UP:  °If any biopsies were taken you will be contacted by phone or by letter within the next 1-3 weeks. Call [phone]  if you have not heard about the biopsies in 3 weeks.  °Please also call with any specific questions about appointments or follow up tests. ° °

## 2020-02-12 NOTE — Anesthesia Preprocedure Evaluation (Addendum)
Anesthesia Evaluation  Patient identified by MRN, date of birth, ID band Patient awake    Reviewed: Allergy & Precautions, NPO status , Patient's Chart, lab work & pertinent test results, reviewed documented beta blocker date and time   History of Anesthesia Complications Negative for: history of anesthetic complications  Airway Mallampati: I  TM Distance: >3 FB Neck ROM: Full    Dental  (+) Edentulous Upper, Upper Dentures, Edentulous Lower   Pulmonary COPD,  COPD inhaler, Current SmokerPatient did not abstain from smoking.,  02/08/2020 SARS coronavirus NEG   breath sounds clear to auscultation       Cardiovascular hypertension, Pt. on medications and Pt. on home beta blockers (-) angina+ dysrhythmias Atrial Fibrillation  Rhythm:Regular Rate:Normal  '20 ECHO: EF 35-40%. 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    Anesthesia Other Findings   Reproductive/Obstetrics                            Anesthesia Physical Anesthesia Plan  ASA: III  Anesthesia Plan: MAC   Post-op Pain Management:    Induction:   PONV Risk Score and Plan: 0  Airway Management Planned: Natural Airway and Simple Face Mask  Additional Equipment: None  Intra-op Plan:   Post-operative Plan:   Informed Consent: I have reviewed the patients History and Physical, chart, labs and discussed the procedure including the risks, benefits and alternatives for the  proposed anesthesia with the patient or authorized representative who has indicated his/her understanding and acceptance.       Plan Discussed with: CRNA and Surgeon  Anesthesia Plan Comments:         Anesthesia Quick Evaluation

## 2020-02-12 NOTE — Anesthesia Postprocedure Evaluation (Signed)
Anesthesia Post Note  Patient: [name]  Procedure(s) Performed: COLONOSCOPY WITH PROPOFOL (N/A ) POLYPECTOMY     Patient location during evaluation: Endoscopy Anesthesia Type: MAC Level of consciousness: awake and alert, patient cooperative and oriented Pain management: pain level controlled Vital Signs Assessment: post-procedure vital signs reviewed and stable Respiratory status: spontaneous breathing, nonlabored ventilation and respiratory function stable Cardiovascular status: stable and blood pressure returned to baseline Postop Assessment: no apparent nausea or vomiting Anesthetic complications: no   No complications documented.  Last Vitals:  Vitals:   02/12/20 1050 02/12/20 1100  BP: (!) 129/70 (!) 156/82  Pulse: 72 78  Resp: 14 17  Temp:    SpO2: 97% 95%    Last Pain:  Vitals:   02/12/20 1115  TempSrc:   PainSc: 0-No pain                 Jakiera Ehler,E. Matty Deamer

## 2020-02-12 NOTE — Op Note (Signed)
[facility] Patient Name: [name] Procedure Date: 02/12/2020 MRN: [id number] Attending MD: Ladene Artist , MD Date of Birth: 1946/06/11 CSN: [id number] Age: 74 Admit Type: Inpatient Procedure:                Colonoscopy Indications:              Surveillance: Personal history of adenomatous                            polyps on last colonoscopy 5 years ago Providers:                Pricilla Riffle. Fuller Plan, MD, Wynonia Sours, RN, Corie Chiquito, Technician, Arnoldo Hooker, CRNA Referring MD:             Marianna Payment, MD Medicines:                Monitored Anesthesia Care Complications:            No immediate complications. Estimated blood loss:                            None. Estimated Blood Loss:     Estimated blood loss: none. Procedure:                Pre-Anesthesia Assessment:                           - Prior to the procedure, a History and Physical                            was performed, and patient medications and                            allergies were reviewed. The patient's tolerance of                            previous anesthesia was also reviewed. The risks                            and benefits of the procedure and the sedation                            options and risks were discussed with the patient.                            All questions were answered, and informed consent                            was obtained. Prior Anticoagulants: The patient has                            taken Eliquis (apixaban), last dose was 2 days  prior to procedure. ASA Grade Assessment: III - A                            patient with severe systemic disease. After                            reviewing the risks and benefits, the patient was                            deemed in satisfactory condition to undergo the                            procedure.                           After obtaining informed  consent, the colonoscope                            was passed under direct vision. Throughout the                            procedure, the patient's blood pressure, pulse, and                            oxygen saturations were monitored continuously. The                            CF-HQ190L ([id number]) Olympus colonoscope was                            introduced through the anus and advanced to the the                            cecum, identified by appendiceal orifice and                            ileocecal valve. The ileocecal valve, appendiceal                            orifice, and rectum were photographed. The quality                            of the bowel preparation was good. The patient                            tolerated the procedure well. The colonoscopy was                            somewhat difficult due to significant colon spasm. Scope In: 10:00:06 AM Scope Out: 10:28:10 AM Scope Withdrawal Time: 0 hours 24 minutes 52 seconds  Total Procedure Duration: 0 hours 28 minutes 4 seconds  Findings:      The perianal and digital rectal examinations were normal.      Four sessile polyps were found in the sigmoid colon,  descending colon,       ascending colon and cecum. The polyps were 6 to 9 mm in size. These       polyps were removed with a cold snare. Resection and retrieval were       complete.      Two semi-pedunculated polyps were found in the descending colon and       transverse colon. The polyps were 10 to 12 mm in size. These polyps were       removed with a hot snare. Resection and retrieval were complete.      A single medium-mouthed diverticulum was found in the cecum.      Internal hemorrhoids were found during retroflexion. The hemorrhoids       were small and Grade I (internal hemorrhoids that do not prolapse).      The exam was otherwise without abnormality on direct and retroflexion       views. Impression:               - Four 6 to 9 mm polyps in the  sigmoid colon, in                            the descending colon, in the ascending colon and in                            the cecum, removed with a cold snare. Resected and                            retrieved.                           - Two 10 to 12 mm polyps in the descending colon                            and in the transverse colon, removed with a hot                            snare. Resected and retrieved.                           - Single diverticulum in the cecum.                           - Internal hemorrhoids.                           - The examination was otherwise normal on direct                            and retroflexion views. Moderate Sedation:      Not Applicable - Patient had care per Anesthesia. Recommendation:           - Repeat colonoscopy after studies are complete for                            surveillance based on pathology results.                           -  Resume Eliquis (apixaban) in 3 days at prior                            dose. Refer to managing physician for further                            adjustment of therapy.                           - Patient has a contact number available for                            emergencies. The signs and symptoms of potential                            delayed complications were discussed with the                            patient. Return to normal activities tomorrow.                            Written discharge instructions were provided to the                            patient.                           - Resume previous diet.                           - Continue present medications.                           - Await pathology results.                           - No aspirin, ibuprofen, naproxen, or other                            non-steroidal anti-inflammatory drugs for 2 weeks                            after polyp removal. Procedure Code(s):        --- Professional ---                            [phone], Colonoscopy, flexible; with removal of                            tumor(s), polyp(s), or other lesion(s) by snare                            technique Diagnosis Code(s):        --- Professional ---                           Z86.010,  Personal history of colonic polyps                           K63.5, Polyp of colon                           K64.0, First degree hemorrhoids CPT copyright 2019 American Medical Association. All rights reserved. The codes documented in this report are preliminary and upon coder review may  be revised to meet current compliance requirements. Ladene Artist, MD 02/12/2020 10:37:09 AM This report has been signed electronically. Number of Addenda: 0

## 2020-02-12 NOTE — Progress Notes (Signed)
Post procedure the pt's wife, [name], was contacted to come and get the patient. It was noted that she didn't have a vehicle and would have to come via taxi. 20 min after p/c pt wheeled to the main entrance, wife had not arrived. Spoke with pt's wife again who stated that she never called the taxi the 1st time but would call now. Pt proceeded to get out of wheelchair to wait on bench by the main entrance. Pt made aware that we could not leave him until he was in the care of a responsible adult. Pt became very agitated and refused at that time to get back in the wheelchair. After multiple conversations pt voluntarily got back into the wheelchair and was wheeled back into the unit to wait for for his wife. While being transported back to the unit the pt mumbled under his breath that his wife wasn't coming, that she wasn't calling a taxi and he was going to break the rules. While waiting in the unit for his wife pt got up and walked out of unit to wait for his ride by the main entrance and refused to come back in . Security was notified of the situation and where the pt was located and agreed to keep an eye on him until his ride arrived.

## 2020-02-12 NOTE — Transfer of Care (Signed)
Immediate Anesthesia Transfer of Care Note  Patient: [name]  Procedure(s) Performed: Procedure(s): COLONOSCOPY WITH PROPOFOL (N/A) POLYPECTOMY  Patient Location: PACU  Anesthesia Type:MAC  Level of Consciousness:  sedated, patient cooperative and responds to stimulation  Airway & Oxygen Therapy:Patient Spontanous Breathing and Patient connected to face mask oxgen  Post-op Assessment:  Report given to PACU RN and Post -op Vital signs reviewed and stable  Post vital signs:  Reviewed and stable  Last Vitals:  Vitals:   02/12/20 0845  BP: (!) 135/60  Resp: 17  Temp: 36.8 C  SpO2: 42%    Complications: No apparent anesthesia complications

## 2020-02-12 NOTE — H&P (Signed)
History of Present Illness: This is a 74 year old male with a personal history of adenomatous colon polyps, internal hemorrhoids.  He has no ongoing gastrointestinal complaints.  He's recovering from a flare of gout in his left wrist.  He has a history of COPD with exacerbations requiring hospitalization last in October 2020.  He has a history of CHF with an EF of 35 to 40%.  He relates that his cardiopulmonary problems have been stable for several months and he feels well.  He is maintained on Eliquis for history of afib.  Denies weight loss, abdominal pain, constipation, diarrhea, change in stool caliber, melena, hematochezia, nausea, vomiting, dysphagia, reflux symptoms, chest pain.   No Known Allergies       Outpatient Medications Prior to Visit  Medication Sig Dispense Refill  . albuterol (PROAIR HFA) 108 (90 Base) MCG/ACT inhaler Inhale 1-2 puffs into the lungs every 6 (six) hours as needed for wheezing or shortness of breath. 18 g 3  . albuterol (PROVENTIL) (2.5 MG/3ML) 0.083% nebulizer solution Take 3 mLs (2.5 mg total) by nebulization 2 (two) times daily as needed for wheezing or shortness of breath. 75 mL 0  . amiodarone (PACERONE) 200 MG tablet TAKE 1/2 TABLET BY MOUTH EVERY DAY (Patient taking differently: Take 100 mg by mouth daily. ) 45 tablet 3  . camphor-menthol (SARNA) lotion Apply topically 3 (three) times daily. (Patient taking differently: Apply 1 application topically daily as needed for itching. ) 222 mL 0  . carvedilol (COREG) 3.125 MG tablet Take 1 tablet in the morning and 2 tablets in the evening (Patient taking differently: Take 3.125-6.25 mg by mouth See admin instructions. Take 1 table (3.125 mg totally) by mouth t in the morning and 2 tablets (6.25 mg totally) by mouth in the evening) 270 tablet 2  . Fluticasone-Umeclidin-Vilant (TRELEGY ELLIPTA) 100-62.5-25 MCG/INH AEPB Inhale 1 puff into the lungs daily. 60 each 5  . folic acid (FOLVITE) 1 MG tablet Take 1 tablet (1 mg  total) by mouth daily. 90 tablet 3  . furosemide (LASIX) 40 MG tablet Take 1 tablet (40 mg total) by mouth 2 (two) times daily. 180 tablet 2  . hydroxyurea (HYDREA) 500 MG capsule Take 2 capsules (1,000 mg total) by mouth daily. MAY TAKE WITH FOOD TO MINIMIZE GI SIDE EFFECTS 180 capsule 3  . predniSONE (DELTASONE) 20 MG tablet Take 2 tablets (40 mg total) by mouth See admin instructions. Take 40 mg (2 tablets) x 2 days then 30 mg (1.5 tablet) x 2 days then 20 mg (1 tablet) x 2 days then 10 mg (0.5 tablet)  x 2 days then stop 10 tablet 0  . roflumilast (DALIRESP) 500 MCG TABS tablet Take 1 tablet (500 mcg total) by mouth daily. 30 tablet 11  . SUBOXONE 8-2 MG FILM Place 1 Film under the tongue daily.     [name] warfarin (COUMADIN) 2.5 MG tablet Take 1 tablet (2.5 mg total) by mouth daily at 6 PM. 30 tablet 2   No facility-administered medications prior to visit.       Past Medical History:  Diagnosis Date  . Acute lower GI bleeding 02/15/2017  . Atrial fibrillation ([place])   . Cardiomyopathy    EF55% 11/14<<35%   . CHF (congestive heart failure) ([place])   . COPD (chronic obstructive pulmonary disease) ([place])   . Essential hypertension   . GERD (gastroesophageal reflux disease)   . Gout   . Hepatitis    Possible history  . History  of atrial flutter    Ablation 2012  . 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 Obesity   . Pneumonia X 1  . Primary polycythemia ([place]) 06/12/2013  . Renal insufficiency   . Substance abuse ([place])    History of alcohol; hx cocaine, heroin, crack use  . Syncope   . Tubular adenoma of colon 08/2014        Past Surgical History:  Procedure Laterality Date  . CARDIAC ELECTROPHYSIOLOGY MAPPING AND ABLATION  08/2010   [name] 09/07/2010 (12/14/2012)  . COLONOSCOPY     15-20 years ago had colon in [place]  . EXCISIONAL HEMORRHOIDECTOMY  1970's  . LOOP RECORDER IMPLANT N/A 08/23/2013   Procedure: LOOP RECORDER IMPLANT;  Surgeon: Deboraha Sprang, MD;  Location[facility] LAB;  Service: Cardiovascular;  Laterality: N/A;  . MULTIPLE EXTRACTIONS WITH ALVEOLOPLASTY Bilateral 01/24/2016   Procedure: MULTIPLE EXTRACTION WITH ALVEOLOPLASTY BILATERAL;  Surgeon: Diona Browner, DDS;  Location: [place];  Service: Oral Surgery;  Laterality: Bilateral;  . MULTIPLE TOOTH EXTRACTIONS  01/24/2016   MULTIPLE EXTRACTION WITH ALVEOLOPLASTY BILATERAL (Bilateral)   Social History        Socioeconomic History  . Marital status: Single    Spouse name: Not on file  . Number of children: Not on file  . Years of education: Not on file  . Highest education level: Not on file  Occupational History  . Occupation: Retired  Tobacco Use  . Smoking status: Current Every Day Smoker    Packs/day: 0.10    Years: 50.00    Pack years: 5.00    Types: Cigarettes  . Smokeless tobacco: Never Used  . Tobacco comment: 1-2 cigarettes a day  Substance and Sexual Activity  . Alcohol use: Yes    Alcohol/week: 89.0 standard drinks    Types: 12 Cans of beer, 77 Shots of liquor per week    Comment: Sometimes.  . Drug use: Not Currently    Types: "Crack" cocaine, Cocaine, Heroin    Comment: 02/15/2017 "last used crack 4-5 years ago; last used heroin a couple years ago; currently denies any drug use-- on suboxone.   . Sexual activity: Not Currently  Other Topics Concern  . Not on file  Social History Narrative   Moved here from [place]. Lives with common law wife and grandchildren. He is retired from "general labor."   Social Determinants of Scientist, physiological Strain:   . Difficulty of Paying Living Expenses:   Food Insecurity:   . Worried About Charity fundraiser in the Last Year:   . Arboriculturist in the Last Year:   Transportation Needs:   . Film/video editor (Medical):   [name] Lack of Transportation (Non-Medical):   Physical Activity:   . Days of Exercise per Week:   . Minutes of Exercise per Session:   Stress:    . Feeling of Stress :   Social Connections:   . Frequency of Communication with Friends and Family:   . Frequency of Social Gatherings with Friends and Family:   . Attends Religious Services:   . Active Member of Clubs or Organizations:   . Attends Archivist Meetings:   [name] Marital Status:         Family History  Problem Relation Age of Onset  . Diabetes Mother   . Hypertension Mother   . Cirrhosis Father   . Alcohol abuse Father   . Colon cancer Neg Hx   .  Rectal cancer Neg Hx   . Stomach cancer Neg Hx       Physical Exam: General: Well developed, well nourished, obese, no acute distress Head: Normocephalic and atraumatic Eyes:  sclerae anicteric, EOMI Ears: Normal auditory acuity Mouth: Not examined, mask on during Covid-19 pandemic Lungs: Clear throughout to auscultation Heart: Regular rate and rhythm; no murmurs, rubs or bruits Abdomen: Soft, non tender and non distended. No masses, hepatosplenomegaly or hernias noted. Normal Bowel sounds Rectal: Deferred to colonoscopy Musculoskeletal: Symmetrical with no gross deformities  Pulses:  Normal pulses noted Extremities: No clubbing, cyanosis, edema or deformities noted. Left wrist brace. Neurological: Alert oriented x 4, grossly nonfocal Psychological:  Alert and cooperative. Normal mood and affect   Assessment and Recommendations:  1. Personal history of adenomatous colon polyps.  He had 3 adenomatous colon polyps less than 1 cm on his last colonoscopy in February 2016. The risks (including bleeding, perforation, infection, missed lesions, medication reactions and possible hospitalization or surgery if complications occur), benefits, and alternatives to colonoscopy with possible biopsy and possible polypectomy were discussed with the patient and they consent to proceed.   2.  History of internal hemorrhoids with hemorrhoidal bleeding per anoscopy in 2018.  3. Afib. Hold Eliquis 2 days before  procedure - will instruct when and how to resume after procedure. Low but real risk of cardiovascular event such as heart attack, stroke, embolism, thrombosis or ischemia/infarct of other organs off Eliquis explained and need to seek urgent help if this occurs. The patient consents to proceed. Will communicate by phone or EMR with patient's prescribing provider to confirm that holding Eliquis is reasonable in this case.   4.  CHF    5. COPD

## 2020-02-13 ENCOUNTER — Encounter (HOSPITAL_COMMUNITY): Payer: Self-pay | Admitting: Gastroenterology

## 2020-02-13 ENCOUNTER — Encounter: Payer: Self-pay | Admitting: Gastroenterology

## 2020-02-13 LAB — SURGICAL PATHOLOGY

## 2020-04-23 ENCOUNTER — Telehealth: Payer: Self-pay | Admitting: Internal Medicine

## 2020-04-23 NOTE — Telephone Encounter (Signed)
Called patient on his mobile phone.  Patient significant other, [name] answered the phone.  I informed her that I was trying to reach Jayjay to tell him his colonoscopy results.  I asked her to relate the message and get him to call [facility] when he gets the chance.  She states that she will relay the message.  Marianna Payment, D.O. [facility], PGY-2 Pager: [phone], Phone: [phone] Date 04/23/2020 Time 4:35 PM

## 2020-05-13 ENCOUNTER — Emergency Department (HOSPITAL_COMMUNITY)
Admission: EM | Admit: 2020-05-13 | Discharge: 2020-05-13 | Disposition: A | Discharge status: Home / Self Care | Payer: Medicare Other | Attending: Emergency Medicine | Admitting: Emergency Medicine

## 2020-05-13 ENCOUNTER — Encounter (HOSPITAL_COMMUNITY): Payer: Self-pay

## 2020-05-13 ENCOUNTER — Other Ambulatory Visit: Payer: Self-pay

## 2020-05-13 DIAGNOSIS — M25572 Pain in left ankle and joints of left foot: Secondary | ICD-10-CM | POA: Insufficient documentation

## 2020-05-13 DIAGNOSIS — I110 Hypertensive heart disease with heart failure: Secondary | ICD-10-CM | POA: Insufficient documentation

## 2020-05-13 DIAGNOSIS — I5023 Acute on chronic systolic (congestive) heart failure: Secondary | ICD-10-CM | POA: Insufficient documentation

## 2020-05-13 DIAGNOSIS — M10072 Idiopathic gout, left ankle and foot: Secondary | ICD-10-CM | POA: Diagnosis not present

## 2020-05-13 DIAGNOSIS — Z5321 Procedure and treatment not carried out due to patient leaving prior to being seen by health care provider: Secondary | ICD-10-CM | POA: Diagnosis not present

## 2020-05-13 DIAGNOSIS — Z85038 Personal history of other malignant neoplasm of large intestine: Secondary | ICD-10-CM | POA: Insufficient documentation

## 2020-05-13 DIAGNOSIS — F17210 Nicotine dependence, cigarettes, uncomplicated: Secondary | ICD-10-CM | POA: Insufficient documentation

## 2020-05-13 DIAGNOSIS — J441 Chronic obstructive pulmonary disease with (acute) exacerbation: Secondary | ICD-10-CM | POA: Insufficient documentation

## 2020-05-13 DIAGNOSIS — Z79899 Other long term (current) drug therapy: Secondary | ICD-10-CM | POA: Insufficient documentation

## 2020-05-13 DIAGNOSIS — Z7901 Long term (current) use of anticoagulants: Secondary | ICD-10-CM | POA: Diagnosis not present

## 2020-05-13 DIAGNOSIS — I4891 Unspecified atrial fibrillation: Secondary | ICD-10-CM | POA: Diagnosis not present

## 2020-05-13 DIAGNOSIS — M109 Gout, unspecified: Secondary | ICD-10-CM

## 2020-05-13 MED ORDER — PREDNISONE 20 MG PO TABS
60.0000 mg | ORAL_TABLET | Freq: Once | ORAL | Status: AC
  Administered 2020-05-13: 60 mg via ORAL
  Filled 2020-05-13: qty 3

## 2020-05-13 MED ORDER — ACETAMINOPHEN 500 MG PO TABS
1000.0000 mg | ORAL_TABLET | Freq: Once | ORAL | Status: AC
  Administered 2020-05-13: 1000 mg via ORAL
  Filled 2020-05-13: qty 2

## 2020-05-13 MED ORDER — COLCHICINE 0.6 MG PO TABS
1.2000 mg | ORAL_TABLET | Freq: Once | ORAL | Status: AC
  Administered 2020-05-13: 1.2 mg via ORAL
  Filled 2020-05-13: qty 2

## 2020-05-13 MED ORDER — OXYCODONE HCL 5 MG PO TABS
5.0000 mg | ORAL_TABLET | Freq: Once | ORAL | Status: AC
  Administered 2020-05-13: 5 mg via ORAL
  Filled 2020-05-13: qty 1

## 2020-05-13 MED ORDER — PREDNISONE 20 MG PO TABS: ORAL_TABLET | ORAL | 0 refills | Status: DC

## 2020-05-13 MED ORDER — KETOROLAC TROMETHAMINE 15 MG/ML IJ SOLN
15.0000 mg | Freq: Once | INTRAMUSCULAR | Status: AC
  Administered 2020-05-13: 15 mg via INTRAMUSCULAR
  Filled 2020-05-13: qty 1

## 2020-05-13 MED ORDER — COLCHICINE 0.6 MG PO TABS: 0.6000 mg | ORAL_TABLET | Freq: Once | ORAL | 0 refills | Status: DC

## 2020-05-13 NOTE — Discharge Instructions (Signed)
Follow up with your doctor.  Return for rapid spreading redness or fever.

## 2020-05-13 NOTE — ED Triage Notes (Signed)
Pt here with c/o pain to his left ankle , pt has hx of gout and thinks this is the same thing as before but is out of meds

## 2020-05-13 NOTE — ED Triage Notes (Signed)
Pt reports left foot pain. Pt has hx of gout, but currently does not have medications. Pt was at [facility] but left due to wait times.

## 2020-05-13 NOTE — ED Provider Notes (Signed)
[facility] DEPT Provider Note   CSN: [id number] Arrival date & time: 05/13/20  1427     History No chief complaint on file.   [name] is a 74 y.o. male.  74 yo M with a chief complaint of left ankle pain.  Patient has had pain in that ankle before.  He feels that this is very similar to when he has had gout.  Denies trauma to the area.  Denies fevers.  Has never had an arthrocentesis of the ankle.  It was actually much worse a few days ago but has improved.  The history is provided by the patient.  Illness Severity:  Moderate Onset quality:  Gradual Duration:  2 days Timing:  Constant Progression:  Partially resolved Chronicity:  New Associated symptoms: myalgias   Associated symptoms: no abdominal pain, no chest pain, no congestion, no diarrhea, no fever, no headaches, no rash, no shortness of breath and no vomiting        Past Medical History:  Diagnosis Date  . Acute lower GI bleeding 02/15/2017  . Atrial fibrillation ([place])   . Cardiomyopathy    EF55% 11/14<<35%   . CHF (congestive heart failure) ([place])   . COPD (chronic obstructive pulmonary disease) ([place])   . Difficult airway for intubation    per telephone encounter notation  . Essential hypertension   . GERD (gastroesophageal reflux disease)   . Gout   . Hepatitis    Possible history  . History of atrial flutter    Ablation 2012  . 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 Obesity   . Pneumonia X 1  . Primary polycythemia ([place]) 06/12/2013  . Renal insufficiency   . Substance abuse ([place])    History of alcohol; hx cocaine, heroin, crack use  . Syncope   . Tubular adenoma of colon 08/2014    Patient Active Problem List   Diagnosis Date Noted  . Hx of adenomatous colonic polyps   . Benign neoplasm of cecum   . Benign neoplasm of ascending colon   . Benign neoplasm of transverse colon   . Benign neoplasm of descending colon   . Benign neoplasm of  sigmoid colon   . Acute on chronic systolic CHF (congestive heart failure) ([place]) 04/04/2019  . Acute respiratory failure with hypoxia ([place]) 03/31/2019  . Acute hypoxemic respiratory failure ([place]) 02/09/2019  . COPD exacerbation ([place]) 04/12/2018  . Elevated troponin 04/12/2018  . Long term current use of amiodarone 01/24/2018  . GI bleed 02/15/2017  . Pancytopenia, acquired ([place]) 11/26/2016  . Tubular adenoma 11/25/2016  . Gout 11/02/2016  . Encounter for central line placement 05/25/2016  . Hepatitis C antibody test positive   . Hematuria 11/09/2014  . Acute kidney injury ([place]) 11/01/2014  . Leukopenia due to antineoplastic chemotherapy ([place]) 08/24/2014  . Thrombocytopenia due to drugs 07/25/2014  . Encounter for screening colonoscopy 07/02/2014  . Chronic anticoagulation 07/02/2014  . Acute pulmonary edema ([place]) 06/05/2014  . Prolonged QT interval 06/05/2014  . Sinus bradycardia 06/05/2014  . Opioid dependence ([place]) 06/05/2014  . Diastolic dysfunction 57/07/7791  . COPD (chronic obstructive pulmonary disease) ([place]) 05/30/2014  . Health care maintenance 05/24/2014  . Acute respiratory failure with hypoxia and hypercapnia ([place]) 04/29/2014  . Anemia due to antineoplastic chemotherapy 04/24/2014  . Thyroid mass 08/26/2013  . Syncope 08/22/2013  . 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 Polycythemia vera ([place]) 01/03/2013  . Malnutrition of moderate degree ([place]) 12/15/2012  . Tobacco abuse 09/15/2012  .  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 Alcoholism ([place]) 09/30/2010  . Hypertension 09/30/2010  . Atrial fibrillation [facility])     Past Surgical History:  Procedure Laterality Date  . CARDIAC ELECTROPHYSIOLOGY MAPPING AND ABLATION  08/2010   [name] 09/07/2010 (12/14/2012)  . COLONOSCOPY     15-20 years ago had colon in [place]  . COLONOSCOPY WITH PROPOFOL N/A 02/12/2020   Procedure: COLONOSCOPY WITH PROPOFOL;  Surgeon: Ladene Artist, MD;  Location:  [facility] ENDOSCOPY;  Service: Endoscopy;  Laterality: N/A;  . EXCISIONAL HEMORRHOIDECTOMY  1970's  . LOOP RECORDER IMPLANT N/A 08/23/2013   Procedure: LOOP RECORDER IMPLANT;  Surgeon: Deboraha Sprang, MD;  Location: [facility] CATH LAB;  Service: Cardiovascular;  Laterality: N/A;  . MULTIPLE EXTRACTIONS WITH ALVEOLOPLASTY Bilateral 01/24/2016   Procedure: MULTIPLE EXTRACTION WITH ALVEOLOPLASTY BILATERAL;  Surgeon: Diona Browner, DDS;  Location: [facility];  Service: Oral Surgery;  Laterality: Bilateral;  . MULTIPLE TOOTH EXTRACTIONS  01/24/2016   MULTIPLE EXTRACTION WITH ALVEOLOPLASTY BILATERAL (Bilateral)  . POLYPECTOMY  02/12/2020   Procedure: POLYPECTOMY;  Surgeon: Ladene Artist, MD;  Location: [facility];  Service: Endoscopy;;       Family History  Problem Relation Age of Onset  . Diabetes Mother   . Hypertension Mother   . Cirrhosis Father   . Alcohol abuse Father   . Colon cancer Neg Hx   . Rectal cancer Neg Hx   . Stomach cancer Neg Hx   . Esophageal cancer Neg Hx   . Colon polyps Neg Hx     Social History   Tobacco Use  . Smoking status: Current Every Day Smoker    Packs/day: 0.10    Years: 50.00    Pack years: 5.00    Types: Cigarettes  . Smokeless tobacco: Never Used  . Tobacco comment: 1-2 cigarettes a day  Vaping Use  . Vaping Use: Never used  Substance Use Topics  . Alcohol use: Yes    Alcohol/week: 89.0 standard drinks    Types: 12 Cans of beer, 77 Shots of liquor per week    Comment: Sometimes.  . Drug use: Not Currently    Home Medications Prior to Admission medications   Medication Sig Start Date End Date Taking? Authorizing Provider  albuterol (PROAIR HFA) 108 (90 Base) MCG/ACT inhaler Inhale 1-2 puffs into the lungs every 6 (six) hours as needed for wheezing or shortness of breath. 01/11/19   [name], MD  albuterol (PROVENTIL) (2.5 MG/3ML) 0.083% nebulizer solution Take 3 mLs (2.5 mg total) by nebulization 2 (two) times daily as needed for wheezing or shortness  of breath. 02/13/19   Kayleen Memos, DO  amiodarone (PACERONE) 200 MG tablet TAKE 1/2 TABLET BY MOUTH EVERY DAY Patient taking differently: Take 100 mg by mouth daily.  04/18/19   Baldwin Jamaica, PA-C  apixaban (ELIQUIS) 5 MG TABS tablet Take 1 tablet (5 mg total) by mouth 2 (two) times daily. 10/27/19   Evans Lance, MD  camphor-menthol [facility]) lotion Apply topically 3 (three) times daily. Patient taking differently: Apply 1 application topically daily as needed for itching.  02/13/19   Kayleen Memos, DO  carvedilol (COREG) 3.125 MG tablet Take 1 tablet in the morning and 2 tablets in the evening Patient taking differently: Take 3.125-6.25 mg by mouth See admin instructions. Take 1 table (3.125 mg totally) by mouth  in the morning and 2 tablets (6.25 mg totally) by mouth in the evening 04/17/19  Fenton, Clint R, PA  colchicine 0.6 MG tablet Take 1 tablet (0.6 mg total) by mouth once for 1 dose. One hour post your initial dose 05/13/20 05/13/20  Deno Etienne, DO  Fluticasone-Umeclidin-Vilant (TRELEGY ELLIPTA) 100-62.5-25 MCG/INH AEPB Inhale 1 puff into the lungs daily. 01/11/19   [name], MD  furosemide (LASIX) 40 MG tablet Take 1 tablet (40 mg total) by mouth 2 (two) times daily. 06/08/19   Baldwin Jamaica, PA-C  hydroxyurea (HYDREA) 500 MG capsule Take 2 capsules (1,000 mg total) by mouth daily. MAY TAKE WITH FOOD TO MINIMIZE GI SIDE EFFECTS 07/24/19   Marianna Payment, MD  predniSONE (DELTASONE) 20 MG tablet 2 tabs po daily x 4 days 05/13/20   Deno Etienne, DO  roflumilast (DALIRESP) 500 MCG TABS tablet Take 1 tablet (500 mcg total) by mouth daily. 07/04/19   Mannam, Hart Robinsons, MD  SUBOXONE 8-2 MG FILM Place 1 Film under the tongue daily.  08/21/15   [provider]    Allergies    Patient has no known allergies.  Review of Systems   Review of Systems  Constitutional: Negative for chills and fever.  HENT: Negative for congestion and facial swelling.   Eyes: Negative for discharge  and visual disturbance.  Respiratory: Negative for shortness of breath.   Cardiovascular: Negative for chest pain and palpitations.  Gastrointestinal: Negative for abdominal pain, diarrhea and vomiting.  Musculoskeletal: Positive for arthralgias and myalgias.  Skin: Negative for color change and rash.  Neurological: Negative for tremors, syncope and headaches.  Psychiatric/Behavioral: Negative for confusion and dysphoric mood.    Physical Exam Updated Vital Signs BP 116/66 (BP Location: Right Arm)   Pulse 69   Temp 98.8 F (37.1 C) (Oral)   Resp 18   SpO2 98%   Physical Exam Vitals and nursing note reviewed.  Constitutional:      Appearance: He is well-developed.  HENT:     Head: Normocephalic and atraumatic.  Eyes:     Pupils: Pupils are equal, round, and reactive to light.  Neck:     Vascular: No JVD.  Cardiovascular:     Rate and Rhythm: Normal rate and regular rhythm.     Heart sounds: No murmur heard.  No friction rub. No gallop.   Pulmonary:     Effort: No respiratory distress.     Breath sounds: No wheezing.  Abdominal:     General: There is no distension.     Tenderness: There is no guarding or rebound.  Musculoskeletal:        General: Swelling and tenderness present. Normal range of motion.     Cervical back: Normal range of motion and neck supple.     Comments: Tenderness and swelling to the right ankle with mild warmth.  Able to range the motion with some tenderness.  Pulse motor and sensation intact distally.  No break in the skin.  Skin:    Coloration: Skin is not pale.     Findings: No rash.  Neurological:     Mental Status: He is alert and oriented to person, place, and time.  Psychiatric:        Behavior: Behavior normal.     ED Results / Procedures / Treatments   Labs (all labs ordered are listed, but only abnormal results are displayed) Labs Reviewed - No data to display  EKG None  Radiology No results found.  Procedures Procedures  (including critical care time)  Medications Ordered in ED Medications  predniSONE (DELTASONE)  tablet 60 mg (60 mg Oral Given 05/13/20 1531)  colchicine tablet 1.2 mg (1.2 mg Oral Given 05/13/20 1531)  oxyCODONE (Oxy IR/ROXICODONE) immediate release tablet 5 mg (5 mg Oral Given 05/13/20 1531)  acetaminophen (TYLENOL) tablet 1,000 mg (1,000 mg Oral Given 05/13/20 1530)  ketorolac (TORADOL) 15 MG/ML injection 15 mg (15 mg Intramuscular Given 05/13/20 1533)    ED Course  I have reviewed the triage vital signs and the nursing notes.  Pertinent labs & imaging results that were available during my care of the patient were reviewed by me and considered in my medical decision making (see chart for details).    MDM Rules/Calculators/A&P                          74 yo M with a chief complaint of left ankle pain.  This is been going on for a few days now.  It was much worse and is gotten better over the past day or so.  I discussed with him possible arthrocentesis to diagnose gout versus septic arthritis.  Patient is declining at this time and feels this is most likely gout as he has had it exactly before and it improved with medications.  Will treat with colchicine and prednisone.  PCP follow-up.  3:56 PM:  I have discussed the diagnosis/risks/treatment options with the patient and family and believe the pt to be eligible for discharge home to follow-up with PCP. We also discussed returning to the ED immediately if new or worsening sx occur. We discussed the sx which are most concerning (e.g., sudden worsening pain, fever, inability to tolerate by mouth) that necessitate immediate return. Medications administered to the patient during their visit and any new prescriptions provided to the patient are listed below.  Medications given during this visit Medications  predniSONE (DELTASONE) tablet 60 mg (60 mg Oral Given 05/13/20 1531)  colchicine tablet 1.2 mg (1.2 mg Oral Given 05/13/20 1531)  oxyCODONE  (Oxy IR/ROXICODONE) immediate release tablet 5 mg (5 mg Oral Given 05/13/20 1531)  acetaminophen (TYLENOL) tablet 1,000 mg (1,000 mg Oral Given 05/13/20 1530)  ketorolac (TORADOL) 15 MG/ML injection 15 mg (15 mg Intramuscular Given 05/13/20 1533)     The patient appears reasonably screen and/or stabilized for discharge and I doubt any other medical condition or other [facility] requiring further screening, evaluation, or treatment in the ED at this time prior to discharge.   Final Clinical Impression(s) / ED Diagnoses Final diagnoses:  Acute gout of left ankle, unspecified cause    Rx / DC Orders ED Discharge Orders         Ordered    predniSONE (DELTASONE) 20 MG tablet        05/13/20 1518    colchicine 0.6 MG tablet   Once        05/13/20 [place], [place], DO 05/13/20 1556

## 2020-05-18 ENCOUNTER — Other Ambulatory Visit (HOSPITAL_COMMUNITY): Payer: Self-pay | Admitting: Physician Assistant

## 2020-06-05 ENCOUNTER — Other Ambulatory Visit: Payer: Self-pay | Admitting: *Deleted

## 2020-06-05 DIAGNOSIS — Z87891 Personal history of nicotine dependence: Secondary | ICD-10-CM

## 2020-06-05 DIAGNOSIS — F17210 Nicotine dependence, cigarettes, uncomplicated: Secondary | ICD-10-CM

## 2020-07-03 ENCOUNTER — Ambulatory Visit (INDEPENDENT_AMBULATORY_CARE_PROVIDER_SITE_OTHER): Payer: Medicare Other | Admitting: Acute Care

## 2020-07-03 ENCOUNTER — Encounter: Payer: Self-pay | Admitting: Acute Care

## 2020-07-03 ENCOUNTER — Other Ambulatory Visit: Payer: Self-pay | Admitting: Internal Medicine

## 2020-07-03 ENCOUNTER — Ambulatory Visit
Admission: RE | Admit: 2020-07-03 | Discharge: 2020-07-03 | Disposition: A | Discharge status: Home / Self Care | Payer: Medicare Other | Source: Ambulatory Visit | Attending: Acute Care | Admitting: Acute Care

## 2020-07-03 DIAGNOSIS — Z87891 Personal history of nicotine dependence: Secondary | ICD-10-CM

## 2020-07-03 DIAGNOSIS — F17210 Nicotine dependence, cigarettes, uncomplicated: Secondary | ICD-10-CM | POA: Diagnosis not present

## 2020-07-03 DIAGNOSIS — Z122 Encounter for screening for malignant neoplasm of respiratory organs: Secondary | ICD-10-CM

## 2020-07-03 NOTE — Progress Notes (Signed)
Shared Decision Making Visit Lung Cancer Screening Program [phone])   Eligibility:  Age 74 y.o.  Pack Years Smoking History Calculation 75 pack year smoking history (# packs/per year x # years smoked)  Recent History of coughing up blood  no  Unexplained weight loss? no ( >Than 15 pounds within the last 6 months )  Prior History Lung / other cancer no (Diagnosis within the last 5 years already requiring surveillance chest CT Scans).  Smoking Status Current Smoker  Former Smokers: Years since quit: NA  Quit Date: NA  Visit Components:  Discussion included one or more decision making aids. yes  Discussion included risk/benefits of screening. yes  Discussion included potential follow up diagnostic testing for abnormal scans. yes  Discussion included meaning and risk of over diagnosis. yes  Discussion included meaning and risk of False Positives. yes  Discussion included meaning of total radiation exposure. yes  Counseling Included:  Importance of adherence to annual lung cancer LDCT screening. yes  Impact of comorbidities on ability to participate in the program. yes  Ability and willingness to under diagnostic treatment. yes  Smoking Cessation Counseling:  Current Smokers:   Discussed importance of smoking cessation. yes  Information about tobacco cessation classes and interventions provided to patient. yes  Patient provided with "ticket" for LDCT Scan. yes  Symptomatic Patient. no  Counseling  Diagnosis Code: Tobacco Use Z72.0  Asymptomatic Patient yes  Counseling (Intermediate counseling: > three minutes counseling) [id number]  Former Smokers:   Discussed the importance of maintaining cigarette abstinence. yes  Diagnosis Code: Personal History of Nicotine Dependence. T73.220  Information about tobacco cessation classes and interventions provided to patient. Yes  Patient provided with "ticket" for LDCT Scan. yes  Written Order for Lung Cancer  Screening with LDCT placed in Epic. Yes (CT Chest Lung Cancer Screening Low Dose W/O CM) [id number] Z12.2-Screening of respiratory organs Z87.891-Personal history of nicotine dependence  I have spent 25 minutes of face to face time with Ms. Colombani discussing the risks and benefits of lung cancer screening. We viewed a power point together that explained in detail the above noted topics. We paused at intervals to allow for questions to be asked and answered to ensure understanding.We discussed that the single most powerful action that she can take to decrease her risk of developing lung cancer is to quit smoking. We discussed whether or not she is ready to commit to setting a quit date. We discussed options for tools to aid in quitting smoking including nicotine replacement therapy, non-nicotine medications, support groups, Quit Smart classes, and behavior modification. We discussed that often times setting smaller, more achievable goals, such as eliminating 1 cigarette a day for a week and then 2 cigarettes a day for a week can be helpful in slowly decreasing the number of cigarettes smoked. This allows for a sense of accomplishment as well as providing a clinical benefit. I gave her the " Be Stronger Than Your Excuses" card with contact information for community resources, classes, free nicotine replacement therapy, and access to mobile apps, text messaging, and on-line smoking cessation help. I have also given her my card and contact information in the event she needs to contact me. We discussed the time and location of the scan, and that either Doroteo Glassman RN or I will call with the results within 24-48 hours of receiving them. I have offered her  a copy of the power point we viewed  as a resource in the event they need reinforcement of  the concepts we discussed today in the office. The patient verbalized understanding of all of  the above and had no further questions upon leaving the office. They have my  contact information in the event they have any further questions.  I spent 4 minutes counseling on smoking cessation and the health risks of continued tobacco abuse.  I explained to the patient that there has been a high incidence of coronary artery disease noted on these exams. I explained that this is a non-gated exam therefore degree or severity cannot be determined. This patient is not on statin therapy. I have asked the patient to follow-up with their PCP regarding any incidental finding of coronary artery disease and management with diet or medication as their PCP  feels is clinically indicated. The patient verbalized understanding of the above and had no further questions upon completion of the visit.      Magdalen Spatz, NP 07/03/2020 10:23 AM

## 2020-07-04 NOTE — Telephone Encounter (Signed)
Pt scheduled to see Charlcie Cradle 07/16/2020, Will send in refill (  1 month supply )so that pt dose not run out medication.

## 2020-07-04 NOTE — Telephone Encounter (Signed)
Prescription refill request for Eliquis received.   Indication: Afib Last office visit: 06/08/2019, Charlcie Cradle Scr: 1.36, 10/27/2019 Age: 74 yo Weight: 99.3 kg   Pt is overdue for appointment with cardiologist. Will reach out to Ep scheduler to schedule an appointment.

## 2020-07-05 ENCOUNTER — Encounter: Payer: Self-pay | Admitting: Acute Care

## 2020-07-15 NOTE — Progress Notes (Signed)
Cardiology Office Note Date:  07/16/2020  Patient ID:  Bailee, Thall 1945/08/18, MRN [id number] PCP:  Dellia Cloud, MD  Electrophysiologist:  Dr. Ladona Ridgel     Chief Complaint:  planned f/u  History of Present Illness: Galo Sayed is a 74 y.o. 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 Ladona Ridgel.   He was more recently hospitalized July 23/2020 with sudden onset of SOB, resp failure, required intubation, was COVID negative, initially treated as pneumonia, COPD exacerbation, flash pulm edema, ? CHF.  W/u noted LVEF was 35-40%.  Cardiology was consulted, not felt to have ACS or need for ischemic evaluation.  Not a candidate for ACE/.ARB with renal function.  BP was limtting addition of hydralazine or nitrate Suggested out pt monitoring to evaluate for any potential rhythm abnormalities that may have contributed to his sudden onset SOB/respiratory failure, consideration for outpatient DCCV or perhaps stop amio if rhythm control no longer being pursued. He was discharged 02/13/2019  He was hospitalized 03/31/2019 with acute resp failure failed BIPAP and required intubation >> diuresed and extubated 04/02/2019.  AKI on CKD with Creat trending downwards at time of discharge.  He remained in Afib, discharge INR was 1.9. Home lasix was doubled  He was the Afib clinic 04/17/2019.  He felt well, unaware of his Afib.  04/27/2019 I saw him He reported feeling "good!".  No rest SOB, did have some DOE.  Not with short walks, he could ambulate about his home without SOB, walked in today he said comfortably, but with longer walks (that he has hard time being speific with) he gets winded.  This is new in this year.  He denied any symptoms or PND or orthopnea. He denied any kind of CP, palpitations or cardiac awareness of any  kind.  Not now or historically.  No dizzy spells, no near syncope or syncope Given his COPD, and on amiodarone for years, he was referred to pulmonary for formal evaluation  He saw Dr. Isaiah Serge 05/02/2019, COPD GOLD D, recommended to stop smoking, no clear evidence of interstitial lung disease with amio, planned for CT chest when able, planned for further pulmonary testing, no changes were made at that visit  Unfortunately hospitalized again with respiratory failure requiring intubation, discharged 05/17/2019.  This was felt to be multifactorial, COPD exacerbation, PNA, and CHF CT noted ascending thoracic diameter measured 4.9 x 4.9 no dissection  EKGs were sinus CT angio chest also noted  Multifocal airspace consolidation, most severe in the lower lobes bilaterally consistent with multifocal pneumonia. Aspiration could present in this manner and may be present concurrently with pneumonia  He last saw EP with me, Nov 2020 He has no rest SOB, denies symptoms of PND or orthopnea, sleeps with 2 pillows for years.  He tells me he walked in from the waiting room without SOB, does not park in handicap parking but usually by the time he gets inside [place] he is winded and will use the electric carts to do his shopping.  This is his baseline for at least a year, probably longer He tells me he weighs at home every other day and his home weight 219-220lbs, steady since his discharge His discharge weight by hospital records was 232lbs, today is 220lbs He reports taking lasix 20mg  daily, not 40mg  BID as noted  on his discharge summary No bleeding or signs of bleeding, warfarin monitored and managed by his PMD Was to have been referred to AHF team, plan for repeat echo at their recommendations, was hesitant to start ACE/ARB.entresto with CKD. Was to see Dr. Lovena Le in 32mo.  Does not look like he saw either.   2021 looks to have been a better year for him.   TODAY He reports his breathing at his baseline  and unchanged over the year. He denies rest SOB, does have a chronic cough, no symptoms of PND or orthopnea, + DOE, as described last year. No CP, palpitations or cardiac awareness No dizzy spells, near syncope or syncope. No bleeding or signs of bleeding He does not feel bloated or like he is retaining fluid   AFib AAD Amiodarone (goes back to at least 2014)  Device information MDT ILR, implanted 08/23/13 for syncope, Reached RRT 10/26/16    Past Medical History:  Diagnosis Date  . Acute lower GI bleeding 02/15/2017  . Atrial fibrillation ([place])   . Cardiomyopathy    EF55% 11/14<<35%   . CHF (congestive heart failure) ([place])   . COPD (chronic obstructive pulmonary disease) ([place])   . Difficult airway for intubation    per telephone encounter notation  . Essential hypertension   . GERD (gastroesophageal reflux disease)   . Gout   . Hepatitis    Possible history  . History of atrial flutter    Ablation 2012  . 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 Obesity   . Pneumonia X 1  . Primary polycythemia ([place]) 06/12/2013  . Renal insufficiency   . Substance abuse ([place])    History of alcohol; hx cocaine, heroin, crack use  . Syncope   . Tubular adenoma of colon 08/2014    Past Surgical History:  Procedure Laterality Date  . CARDIAC ELECTROPHYSIOLOGY MAPPING AND ABLATION  08/2010   [name] 09/07/2010 (12/14/2012)  . COLONOSCOPY     15-20 years ago had colon in [place]  . COLONOSCOPY WITH PROPOFOL N/A 02/12/2020   Procedure: COLONOSCOPY WITH PROPOFOL;  Surgeon: Ladene Artist, MD;  Location: [facility];  Service: Endoscopy;  Laterality: N/A;  . EXCISIONAL HEMORRHOIDECTOMY  1970's  . LOOP RECORDER IMPLANT N/A 08/23/2013   Procedure: LOOP RECORDER IMPLANT;  Surgeon: Deboraha Sprang, MD;  Location: [facility] CATH LAB;  Service: Cardiovascular;  Laterality: N/A;  . MULTIPLE EXTRACTIONS WITH ALVEOLOPLASTY Bilateral 01/24/2016   Procedure: MULTIPLE EXTRACTION WITH ALVEOLOPLASTY BILATERAL;   Surgeon: Diona Browner, DDS;  Location: [facility];  Service: Oral Surgery;  Laterality: Bilateral;  . MULTIPLE TOOTH EXTRACTIONS  01/24/2016   MULTIPLE EXTRACTION WITH ALVEOLOPLASTY BILATERAL (Bilateral)  . POLYPECTOMY  02/12/2020   Procedure: POLYPECTOMY;  Surgeon: Ladene Artist, MD;  Location: [facility];  Service: Endoscopy;;    Current Outpatient Medications  Medication Sig Dispense Refill  . albuterol (PROAIR HFA) 108 (90 Base) MCG/ACT inhaler Inhale 1-2 puffs into the lungs every 6 (six) hours as needed for wheezing or shortness of breath. 18 g 3  . amiodarone (PACERONE) 200 MG tablet TAKE 1/2 TABLET BY MOUTH EVERY DAY 45 tablet 3  . camphor-menthol (SARNA) lotion Apply topically 3 (three) times daily. (Patient taking differently: Apply 1 application topically daily as needed for itching.) 222 mL 0  . carvedilol (COREG) 3.125 MG tablet Take 9.375 mg by mouth 2 (two) times daily with a meal. 1 tab am, 2 tabs pm    . ELIQUIS 5 MG TABS tablet TAKE 1 TABLET(5  MG) BY MOUTH TWICE DAILY 60 tablet 0  . furosemide (LASIX) 40 MG tablet Take 1 tablet (40 mg total) by mouth 2 (two) times daily. 180 tablet 2  . hydroxyurea (HYDREA) 500 MG capsule Take 2 capsules (1,000 mg total) by mouth daily. MAY TAKE WITH FOOD TO MINIMIZE GI SIDE EFFECTS 180 capsule 3  . roflumilast (DALIRESP) 500 MCG TABS tablet Take 1 tablet (500 mcg total) by mouth daily. 30 tablet 11  . SUBOXONE 8-2 MG FILM Place 2 Film under the tongue daily.    . SYMBICORT 160-4.5 MCG/ACT inhaler 2 puffs at bedtime.     No current facility-administered medications for this visit.    Allergies:   Patient has no known allergies.   Social History:  The patient  reports that he has been smoking cigarettes. He has a 75.00 pack-year smoking history. He has never used smokeless tobacco. He reports current alcohol use of about 89.0 standard drinks of alcohol per week. He reports previous drug use.   Family History:  The patient's family history  includes Alcohol abuse in his father; Cirrhosis in his father; Diabetes in his mother; Hypertension in his mother.  ROS:  Please see the history of present illness.  All other systems are reviewed and otherwise negative.   PHYSICAL EXAM:  VS:  BP 118/68   Pulse 72   Ht 5\' 6"  (1.676 m)   Wt 202 lb (91.6 kg)   SpO2 95%   BMI 32.60 kg/m  BMI: Body mass index is 32.6 kg/m. Well nourished, well developed, in no acute distress  HEENT: normocephalic, atraumatic  Neck: no JVD, carotid bruits or masses Cardiac:  RRR; no significant murmurs, no rubs, or gallops Lungs:  CTA b/l, no rhonchi or rales  Abd: soft, nontender MS: no deformity or atrophy,  Ext:  no edema to mid shin b/l, chronic looking skin changes Skin: warm and dry, no rash Neuro:  No gross deficits appreciated Psych: euthymic mood, full affect  ILR site is stable    EKG:  Done today and reviewed by myself SR, 72bpm, unchanged  03/2019, 2 week Event monitor 1. Atrial fib with a slow VR, controlled VR and RVR 2. NSR with sinus bradycardia 3. Occaisional PVC's, couplets and brief NSVT 4. No prolonged pauses  Gregg Taylor,M.D.  Echocardiogram 02/10/2019: 1. The left ventricle has moderately reduced systolic function, with an ejection fraction of 35-40%. The cavity size was moderately dilated. There is mild concentric left ventricular hypertrophy. Left ventricular diastolic function could not be  evaluated secondary to atrial fibrillation. Left ventricular diffuse hypokinesis. 2. The right ventricle has normal systolic function. The cavity was normal. There is no increase in right ventricular wall thickness. Right ventricular systolic pressure is normal with an estimated pressure of 26.7 mmHg. 3. Large pleural effusion in the left lateral region. 4. Small pericardial effusion. 5. The pericardial effusion is posterior to the left ventricle. 6. Mild thickening of the mitral valve leaflet. 7. The aortic valve is  tricuspid. Aortic valve regurgitation is moderate by color flow Doppler. 8. The aorta is abnormal in size and structure.  06/07/14: LVEF 60-65% 04/29/14; LVEF 50-55% 06/09/13; LVEF 50-55% 08/31/12: LVEF 40-45%     Recent Labs: 10/27/2019: BUN 21; Creatinine, Ser 1.36; Hemoglobin 13.0; Platelets 196; Potassium 4.5; Sodium 143  No results found for requested labs within last 8760 hours.   CrCl cannot be calculated (Patient's most recent lab result is older than the maximum 21 days allowed.).   Wt Readings  from Last 3 Encounters:  07/16/20 202 lb (91.6 kg)  02/12/20 (!) 219 lb (99.3 kg)  01/29/20 199 lb 6.4 oz (90.4 kg)     Other studies reviewed: Additional studies/records reviewed today include: summarized above  ASSESSMENT AND PLAN:  1. Persistent AFib     CHA2DS2Vasc is 3, on Eliquis     On amiodarone, no clear pulm tox by his pulm evaluation last year     No change in respiratory status     Labs today  2. CM 3. Chronic CHF (systolic) 4. COPD is severe His COPD is descirbed as III or D by notes His LVEF was down in Sept 2020  This year has been better for him, he reports his DOE at baseline Will update his echo (presumed he has maintained SR this past year    Disposition:   Will have him back in 105mo, sooner if needed     Current medicines are reviewed at length with the patient today.  The patient did not have any concerns regarding medicines.    Norma Fredrickson, PA-C 07/16/2020 1:51 PM     [facility] [address] [place] [address] [phone] (office)  [phone] (fax)

## 2020-07-16 ENCOUNTER — Encounter: Payer: Self-pay | Admitting: Physician Assistant

## 2020-07-16 ENCOUNTER — Other Ambulatory Visit: Payer: Self-pay

## 2020-07-16 ENCOUNTER — Ambulatory Visit (INDEPENDENT_AMBULATORY_CARE_PROVIDER_SITE_OTHER): Payer: Medicare Other | Admitting: Physician Assistant

## 2020-07-16 VITALS — BP 118/68 | HR 72 | Ht 66.0 in | Wt 202.0 lb

## 2020-07-16 DIAGNOSIS — I428 Other cardiomyopathies: Secondary | ICD-10-CM | POA: Diagnosis not present

## 2020-07-16 DIAGNOSIS — I4819 Other persistent atrial fibrillation: Secondary | ICD-10-CM

## 2020-07-16 DIAGNOSIS — I5022 Chronic systolic (congestive) heart failure: Secondary | ICD-10-CM

## 2020-07-16 NOTE — Patient Instructions (Signed)
Medication Instructions:   Your physician recommends that you continue on your current medications as directed. Please refer to the Current Medication list given to you today.  *If you need a refill on your cardiac medications before your next appointment, please call your pharmacy*   Lab Work:   CMET CBC AND TSH TODAY   If you have labs (blood work) drawn today and your tests are completely normal, you will receive your results only by: [name] MyChart Message (if you have MyChart) OR . A paper copy in the mail If you have any lab test that is abnormal or we need to change your treatment, we will call you to review the results.   Testing/Procedures: Your physician has requested that you have an echocardiogram. Echocardiography is a painless test that uses sound waves to create images of your heart. It provides your doctor with information about the size and shape of your heart and how well your heart's chambers and valves are working. This procedure takes approximately one hour. There are no restrictions for this procedure.   Follow-Up: At [facility], you and your health needs are our priority.  As part of our continuing mission to provide you with exceptional heart care, we have created designated Provider Care Teams.  These Care Teams include your primary Cardiologist (physician) and Advanced Practice Providers (APPs -  Physician Assistants and Nurse Practitioners) who all work together to provide you with the care you need, when you need it.  We recommend signing up for the patient portal called "MyChart".  Sign up information is provided on this After Visit Summary.  MyChart is used to connect with patients for Virtual Visits (Telemedicine).  Patients are able to view lab/test results, encounter notes, upcoming appointments, etc.  Non-urgent messages can be sent to your provider as well.   To learn more about what you can do with MyChart, go to [email or web address].    Your next  appointment:   6 month(s)  The format for your next appointment:   In Person  Provider:   You may see Lewayne Bunting, MD or one of the following Advanced Practice Providers on your designated Care Team:    Francis Dowse, [place]   Other Instructions

## 2020-07-17 LAB — COMPREHENSIVE METABOLIC PANEL
ALT: 11 IU/L (ref 0–44)
AST: 17 IU/L (ref 0–40)
Albumin/Globulin Ratio: 1 — ABNORMAL LOW (ref 1.2–2.2)
Albumin: 3.8 g/dL (ref 3.7–4.7)
Alkaline Phosphatase: 102 IU/L (ref 44–121)
BUN/Creatinine Ratio: 14 (ref 10–24)
BUN: 19 mg/dL (ref 8–27)
Bilirubin Total: 0.4 mg/dL (ref 0.0–1.2)
CO2: 23 mmol/L (ref 20–29)
Calcium: 9.3 mg/dL (ref 8.6–10.2)
Chloride: 103 mmol/L (ref 96–106)
Creatinine, Ser: 1.33 mg/dL — ABNORMAL HIGH (ref 0.76–1.27)
GFR calc Af Amer: 61 mL/min/{1.73_m2} (ref >59)
GFR calc non Af Amer: 53 mL/min/{1.73_m2} — ABNORMAL LOW (ref >59)
Globulin, Total: 3.7 g/dL (ref 1.5–4.5)
Glucose: 89 mg/dL (ref 65–99)
Potassium: 4.9 mmol/L (ref 3.5–5.2)
Sodium: 141 mmol/L (ref 134–144)
Total Protein: 7.5 g/dL (ref 6.0–8.5)

## 2020-07-17 LAB — CBC
Hematocrit: 44.5 % (ref 37.5–51.0)
Hemoglobin: 15.6 g/dL (ref 13.0–17.7)
MCH: 37.2 pg — ABNORMAL HIGH (ref 26.6–33.0)
MCHC: 35.1 g/dL (ref 31.5–35.7)
MCV: 106 fL — ABNORMAL HIGH (ref 79–97)
Platelets: 257 10*3/uL (ref 150–450)
RBC: 4.19 x10E6/uL (ref 4.14–5.80)
RDW: 12.9 % (ref 11.6–15.4)
WBC: 7.5 10*3/uL (ref 3.4–10.8)

## 2020-07-17 LAB — TSH: TSH: 1.41 u[IU]/mL (ref 0.450–4.500)

## 2020-07-18 NOTE — Progress Notes (Signed)
Please call patient and let them  know their  low dose Ct was read as a Lung RADS 2: nodules that are benign in appearance and behavior with a very low likelihood of becoming a clinically active cancer due to size or lack of growth. Recommendation per radiology is for a repeat LDCT in 12 months. .Please let them  know we will order and schedule their  annual screening scan for 06/2021. Please let them  know there was notation of CAD on their  scan.  Please remind the patient  that this is a non-gated exam therefore degree or severity of disease  cannot be determined. Please have them  follow up with their PCP regarding potential risk factor modification, dietary therapy or pharmacologic therapy if clinically indicated. Pt.  is  not currently on statin therapy. Please place order for annual  screening scan for  06/2021 and fax results to PCP. Thanks so much.  Pt. Is followed by cards.

## 2020-07-25 ENCOUNTER — Other Ambulatory Visit: Payer: Self-pay | Admitting: *Deleted

## 2020-07-25 DIAGNOSIS — F17210 Nicotine dependence, cigarettes, uncomplicated: Secondary | ICD-10-CM

## 2020-07-25 DIAGNOSIS — Z87891 Personal history of nicotine dependence: Secondary | ICD-10-CM

## 2020-08-05 ENCOUNTER — Other Ambulatory Visit (HOSPITAL_COMMUNITY): Payer: Medicare Other

## 2020-08-15 ENCOUNTER — Ambulatory Visit (HOSPITAL_COMMUNITY): Payer: Medicare Other | Attending: Internal Medicine

## 2020-08-15 ENCOUNTER — Other Ambulatory Visit: Payer: Self-pay

## 2020-08-15 DIAGNOSIS — I5022 Chronic systolic (congestive) heart failure: Secondary | ICD-10-CM | POA: Diagnosis present

## 2020-08-15 DIAGNOSIS — I428 Other cardiomyopathies: Secondary | ICD-10-CM | POA: Diagnosis present

## 2020-08-15 LAB — ECHOCARDIOGRAM COMPLETE
AR max vel: 2.41 cm2
AV Area VTI: 2.42 cm2
AV Area mean vel: 2.43 cm2
AV Mean grad: 9 mmHg
AV Peak grad: 18.7 mmHg
Ao pk vel: 2.16 m/s
Area-P 1/2: 3.21 cm2
P 1/2 time: 347 msec
S' Lateral: 4 cm

## 2020-08-18 ENCOUNTER — Other Ambulatory Visit: Payer: Self-pay | Admitting: Internal Medicine

## 2020-08-19 NOTE — Telephone Encounter (Signed)
Pt last saw Tommye Standard, [place] on 07/16/20, last labs 07/16/20 Creat 1.33, age 75, weight 91.6kg, based on specified criteria pt is on appropriate dosage of Eliquis 5mg  BID for afib.  Will refill rx.

## 2020-10-08 ENCOUNTER — Inpatient Hospital Stay (HOSPITAL_COMMUNITY)
Admission: EM | Admit: 2020-10-08 | Discharge: 2020-10-13 | DRG: 246 | Disposition: A | Discharge status: Home / Self Care | Payer: Medicare Other | Attending: Internal Medicine | Admitting: Internal Medicine

## 2020-10-08 ENCOUNTER — Other Ambulatory Visit: Payer: Self-pay

## 2020-10-08 ENCOUNTER — Emergency Department (HOSPITAL_COMMUNITY): Payer: Medicare Other

## 2020-10-08 DIAGNOSIS — Z6834 Body mass index (BMI) 34.0-34.9, adult: Secondary | ICD-10-CM | POA: Diagnosis not present

## 2020-10-08 DIAGNOSIS — E876 Hypokalemia: Secondary | ICD-10-CM | POA: Diagnosis not present

## 2020-10-08 DIAGNOSIS — I4891 Unspecified atrial fibrillation: Secondary | ICD-10-CM | POA: Diagnosis not present

## 2020-10-08 DIAGNOSIS — I5040 Unspecified combined systolic (congestive) and diastolic (congestive) heart failure: Secondary | ICD-10-CM | POA: Diagnosis not present

## 2020-10-08 DIAGNOSIS — E785 Hyperlipidemia, unspecified: Secondary | ICD-10-CM | POA: Diagnosis present

## 2020-10-08 DIAGNOSIS — Z811 Family history of alcohol abuse and dependence: Secondary | ICD-10-CM

## 2020-10-08 DIAGNOSIS — J449 Chronic obstructive pulmonary disease, unspecified: Secondary | ICD-10-CM | POA: Diagnosis not present

## 2020-10-08 DIAGNOSIS — M545 Low back pain, unspecified: Secondary | ICD-10-CM | POA: Diagnosis present

## 2020-10-08 DIAGNOSIS — I428 Other cardiomyopathies: Secondary | ICD-10-CM | POA: Diagnosis present

## 2020-10-08 DIAGNOSIS — D45 Polycythemia vera: Secondary | ICD-10-CM | POA: Diagnosis present

## 2020-10-08 DIAGNOSIS — I130 Hypertensive heart and chronic kidney disease with heart failure and stage 1 through stage 4 chronic kidney disease, or unspecified chronic kidney disease: Secondary | ICD-10-CM | POA: Diagnosis not present

## 2020-10-08 DIAGNOSIS — I10 Essential (primary) hypertension: Secondary | ICD-10-CM | POA: Diagnosis not present

## 2020-10-08 DIAGNOSIS — I25110 Atherosclerotic heart disease of native coronary artery with unstable angina pectoris: Secondary | ICD-10-CM | POA: Diagnosis not present

## 2020-10-08 DIAGNOSIS — N1831 Chronic kidney disease, stage 3a: Secondary | ICD-10-CM | POA: Diagnosis not present

## 2020-10-08 DIAGNOSIS — Z8249 Family history of ischemic heart disease and other diseases of the circulatory system: Secondary | ICD-10-CM

## 2020-10-08 DIAGNOSIS — J9601 Acute respiratory failure with hypoxia: Secondary | ICD-10-CM | POA: Diagnosis present

## 2020-10-08 DIAGNOSIS — Z20822 Contact with and (suspected) exposure to covid-19: Secondary | ICD-10-CM | POA: Diagnosis not present

## 2020-10-08 DIAGNOSIS — I959 Hypotension, unspecified: Secondary | ICD-10-CM | POA: Diagnosis present

## 2020-10-08 DIAGNOSIS — R0603 Acute respiratory distress: Secondary | ICD-10-CM

## 2020-10-08 DIAGNOSIS — M109 Gout, unspecified: Secondary | ICD-10-CM | POA: Diagnosis present

## 2020-10-08 DIAGNOSIS — Z7951 Long term (current) use of inhaled steroids: Secondary | ICD-10-CM

## 2020-10-08 DIAGNOSIS — I2510 Atherosclerotic heart disease of native coronary artery without angina pectoris: Secondary | ICD-10-CM | POA: Diagnosis not present

## 2020-10-08 DIAGNOSIS — K219 Gastro-esophageal reflux disease without esophagitis: Secondary | ICD-10-CM | POA: Diagnosis not present

## 2020-10-08 DIAGNOSIS — R7303 Prediabetes: Secondary | ICD-10-CM | POA: Diagnosis present

## 2020-10-08 DIAGNOSIS — I214 Non-ST elevation (NSTEMI) myocardial infarction: Principal | ICD-10-CM

## 2020-10-08 DIAGNOSIS — I712 Thoracic aortic aneurysm, without rupture: Secondary | ICD-10-CM | POA: Diagnosis present

## 2020-10-08 DIAGNOSIS — Z79899 Other long term (current) drug therapy: Secondary | ICD-10-CM

## 2020-10-08 DIAGNOSIS — N141 Nephropathy induced by other drugs, medicaments and biological substances: Secondary | ICD-10-CM | POA: Diagnosis not present

## 2020-10-08 DIAGNOSIS — I5043 Acute on chronic combined systolic (congestive) and diastolic (congestive) heart failure: Secondary | ICD-10-CM | POA: Diagnosis present

## 2020-10-08 DIAGNOSIS — Z7901 Long term (current) use of anticoagulants: Secondary | ICD-10-CM

## 2020-10-08 DIAGNOSIS — F17210 Nicotine dependence, cigarettes, uncomplicated: Secondary | ICD-10-CM | POA: Diagnosis present

## 2020-10-08 DIAGNOSIS — E669 Obesity, unspecified: Secondary | ICD-10-CM | POA: Diagnosis present

## 2020-10-08 DIAGNOSIS — I480 Paroxysmal atrial fibrillation: Secondary | ICD-10-CM | POA: Diagnosis not present

## 2020-10-08 DIAGNOSIS — Z955 Presence of coronary angioplasty implant and graft: Secondary | ICD-10-CM

## 2020-10-08 DIAGNOSIS — T508X5A Adverse effect of diagnostic agents, initial encounter: Secondary | ICD-10-CM | POA: Diagnosis not present

## 2020-10-08 DIAGNOSIS — I080 Rheumatic disorders of both mitral and aortic valves: Secondary | ICD-10-CM | POA: Diagnosis present

## 2020-10-08 DIAGNOSIS — Z833 Family history of diabetes mellitus: Secondary | ICD-10-CM

## 2020-10-08 LAB — URINALYSIS, ROUTINE W REFLEX MICROSCOPIC
Bilirubin Urine: NEGATIVE
Glucose, UA: NEGATIVE mg/dL
Hgb urine dipstick: NEGATIVE
Ketones, ur: 5 mg/dL — AB
Leukocytes,Ua: NEGATIVE
Nitrite: NEGATIVE
Protein, ur: NEGATIVE mg/dL
Specific Gravity, Urine: 1.031 — ABNORMAL HIGH (ref 1.005–1.030)
pH: 5 (ref 5.0–8.0)

## 2020-10-08 LAB — COMPREHENSIVE METABOLIC PANEL
ALT: 13 U/L (ref 0–44)
AST: 18 U/L (ref 15–41)
Albumin: 3.1 g/dL — ABNORMAL LOW (ref 3.5–5.0)
Alkaline Phosphatase: 93 U/L (ref 38–126)
Anion gap: 12 (ref 5–15)
BUN: 17 mg/dL (ref 8–23)
CO2: 20 mmol/L — ABNORMAL LOW (ref 22–32)
Calcium: 8.4 mg/dL — ABNORMAL LOW (ref 8.9–10.3)
Chloride: 108 mmol/L (ref 98–111)
Creatinine, Ser: 1.44 mg/dL — ABNORMAL HIGH (ref 0.61–1.24)
GFR, Estimated: 51 mL/min — ABNORMAL LOW (ref >60)
Glucose, Bld: 170 mg/dL — ABNORMAL HIGH (ref 70–99)
Potassium: 3.4 mmol/L — ABNORMAL LOW (ref 3.5–5.1)
Sodium: 140 mmol/L (ref 135–145)
Total Bilirubin: 0.7 mg/dL (ref 0.3–1.2)
Total Protein: 7.3 g/dL (ref 6.5–8.1)

## 2020-10-08 LAB — CBC WITH DIFFERENTIAL/PLATELET
Abs Immature Granulocytes: 0.06 10*3/uL (ref 0.00–0.07)
Abs Immature Granulocytes: 0.06 10*3/uL (ref 0.00–0.07)
Basophils Absolute: 0.1 10*3/uL (ref 0.0–0.1)
Basophils Absolute: 0.1 10*3/uL (ref 0.0–0.1)
Basophils Relative: 1 %
Basophils Relative: 0 %
Eosinophils Absolute: 0.3 10*3/uL (ref 0.0–0.5)
Eosinophils Absolute: 0.1 10*3/uL (ref 0.0–0.5)
Eosinophils Relative: 3 %
Eosinophils Relative: 1 %
HCT: 53.9 % — ABNORMAL HIGH (ref 39.0–52.0)
HCT: 47.7 % (ref 39.0–52.0)
Hemoglobin: 17.5 g/dL — ABNORMAL HIGH (ref 13.0–17.0)
Hemoglobin: 15.9 g/dL (ref 13.0–17.0)
Immature Granulocytes: 1 %
Immature Granulocytes: 1 %
Lymphocytes Relative: 17 %
Lymphocytes Relative: 13 %
Lymphs Abs: 1.9 10*3/uL (ref 0.7–4.0)
Lymphs Abs: 1.5 10*3/uL (ref 0.7–4.0)
MCH: 31.6 pg (ref 26.0–34.0)
MCH: 31.6 pg (ref 26.0–34.0)
MCHC: 32.5 g/dL (ref 30.0–36.0)
MCHC: 33.3 g/dL (ref 30.0–36.0)
MCV: 97.5 fL (ref 80.0–100.0)
MCV: 94.8 fL (ref 80.0–100.0)
Monocytes Absolute: 0.7 10*3/uL (ref 0.1–1.0)
Monocytes Absolute: 1 10*3/uL (ref 0.1–1.0)
Monocytes Relative: 6 %
Monocytes Relative: 9 %
Neutro Abs: 8.4 10*3/uL — ABNORMAL HIGH (ref 1.7–7.7)
Neutro Abs: 8.6 10*3/uL — ABNORMAL HIGH (ref 1.7–7.7)
Neutrophils Relative %: 72 %
Neutrophils Relative %: 76 %
Platelets: 275 10*3/uL (ref 150–400)
Platelets: 243 10*3/uL (ref 150–400)
RBC: 5.53 MIL/uL (ref 4.22–5.81)
RBC: 5.03 MIL/uL (ref 4.22–5.81)
RDW: 13.5 % (ref 11.5–15.5)
RDW: 13.6 % (ref 11.5–15.5)
WBC: 11.3 10*3/uL — ABNORMAL HIGH (ref 4.0–10.5)
WBC: 11.4 10*3/uL — ABNORMAL HIGH (ref 4.0–10.5)
nRBC: 0 % (ref 0.0–0.2)
nRBC: 0 % (ref 0.0–0.2)

## 2020-10-08 LAB — I-STAT ARTERIAL BLOOD GAS, ED
Acid-base deficit: 2 mmol/L (ref 0.0–2.0)
Bicarbonate: 23.1 mmol/L (ref 20.0–28.0)
Calcium, Ion: 1.17 mmol/L (ref 1.15–1.40)
HCT: 49 % (ref 39.0–52.0)
Hemoglobin: 16.7 g/dL (ref 13.0–17.0)
O2 Saturation: 96 %
Patient temperature: 98.9
Potassium: 3.3 mmol/L — ABNORMAL LOW (ref 3.5–5.1)
Sodium: 141 mmol/L (ref 135–145)
TCO2: 24 mmol/L (ref 22–32)
pCO2 arterial: 40.6 mmHg (ref 32.0–48.0)
pH, Arterial: 7.363 (ref 7.350–7.450)
pO2, Arterial: 82 mmHg — ABNORMAL LOW (ref 83.0–108.0)

## 2020-10-08 LAB — LACTIC ACID, PLASMA
Lactic Acid, Venous: 2.8 mmol/L (ref 0.5–1.9)
Lactic Acid, Venous: 1.6 mmol/L (ref 0.5–1.9)

## 2020-10-08 LAB — TROPONIN I (HIGH SENSITIVITY)
Troponin I (High Sensitivity): 54 ng/L — ABNORMAL HIGH (ref <18)
Troponin I (High Sensitivity): 1637 ng/L (ref <18)
Troponin I (High Sensitivity): 9404 ng/L (ref <18)
Troponin I (High Sensitivity): 11331 ng/L (ref <18)

## 2020-10-08 LAB — PROTIME-INR
INR: 1.4 — ABNORMAL HIGH (ref 0.8–1.2)
Prothrombin Time: 16.6 seconds — ABNORMAL HIGH (ref 11.4–15.2)

## 2020-10-08 LAB — RESP PANEL BY RT-PCR (FLU A&B, COVID) ARPGX2
Influenza A by PCR: NEGATIVE
Influenza B by PCR: NEGATIVE
SARS Coronavirus 2 by RT PCR: NEGATIVE

## 2020-10-08 LAB — I-STAT CHEM 8, ED
BUN: 20 mg/dL (ref 8–23)
Calcium, Ion: 0.93 mmol/L — ABNORMAL LOW (ref 1.15–1.40)
Chloride: 110 mmol/L (ref 98–111)
Creatinine, Ser: 1.5 mg/dL — ABNORMAL HIGH (ref 0.61–1.24)
Glucose, Bld: 172 mg/dL — ABNORMAL HIGH (ref 70–99)
HCT: 53 % — ABNORMAL HIGH (ref 39.0–52.0)
Hemoglobin: 18 g/dL — ABNORMAL HIGH (ref 13.0–17.0)
Potassium: 3.4 mmol/L — ABNORMAL LOW (ref 3.5–5.1)
Sodium: 143 mmol/L (ref 135–145)
TCO2: 22 mmol/L (ref 22–32)

## 2020-10-08 LAB — BRAIN NATRIURETIC PEPTIDE: B Natriuretic Peptide: 250.2 pg/mL — ABNORMAL HIGH (ref 0.0–100.0)

## 2020-10-08 LAB — RAPID URINE DRUG SCREEN, HOSP PERFORMED
Amphetamines: NOT DETECTED
Barbiturates: NOT DETECTED
Benzodiazepines: NOT DETECTED
Cocaine: NOT DETECTED
Opiates: NOT DETECTED
Tetrahydrocannabinol: NOT DETECTED

## 2020-10-08 MED ORDER — ROFLUMILAST 500 MCG PO TABS
500.0000 ug | ORAL_TABLET | Freq: Every day | ORAL | Status: DC
  Administered 2020-10-08 – 2020-10-13 (×6): 500 ug via ORAL
  Filled 2020-10-08 (×6): qty 1

## 2020-10-08 MED ORDER — BUPRENORPHINE HCL-NALOXONE HCL 8-2 MG SL SUBL
2.0000 | SUBLINGUAL_TABLET | Freq: Every day | SUBLINGUAL | Status: DC
  Administered 2020-10-09 – 2020-10-13 (×5): 2 via SUBLINGUAL
  Filled 2020-10-08 (×5): qty 2

## 2020-10-08 MED ORDER — ACETAMINOPHEN 650 MG RE SUPP: 650.0000 mg | Freq: Four times a day (QID) | RECTAL | Status: DC | PRN

## 2020-10-08 MED ORDER — APIXABAN 5 MG PO TABS
5.0000 mg | ORAL_TABLET | Freq: Two times a day (BID) | ORAL | Status: DC
  Administered 2020-10-08: 5 mg via ORAL
  Filled 2020-10-08: qty 1

## 2020-10-08 MED ORDER — ALBUTEROL SULFATE HFA 108 (90 BASE) MCG/ACT IN AERS
1.0000 | INHALATION_SPRAY | Freq: Four times a day (QID) | RESPIRATORY_TRACT | Status: DC | PRN
  Filled 2020-10-08: qty 6.7

## 2020-10-08 MED ORDER — AMIODARONE HCL 200 MG PO TABS
200.0000 mg | ORAL_TABLET | Freq: Every day | ORAL | Status: DC
  Administered 2020-10-09: 200 mg via ORAL
  Filled 2020-10-08: qty 1

## 2020-10-08 MED ORDER — ACETAMINOPHEN 325 MG PO TABS
650.0000 mg | ORAL_TABLET | Freq: Four times a day (QID) | ORAL | Status: DC | PRN
  Administered 2020-10-09 – 2020-10-10 (×3): 650 mg via ORAL
  Filled 2020-10-08 (×3): qty 2

## 2020-10-08 MED ORDER — DILTIAZEM HCL-DEXTROSE 125-5 MG/125ML-% IV SOLN (PREMIX): 5.0000 mg/h | INTRAVENOUS | Status: DC

## 2020-10-08 MED ORDER — AMIODARONE LOAD VIA INFUSION
150.0000 mg | Freq: Once | INTRAVENOUS | Status: AC
  Administered 2020-10-08: 150 mg via INTRAVENOUS
  Filled 2020-10-08: qty 83.34

## 2020-10-08 MED ORDER — AMIODARONE HCL IN DEXTROSE 360-4.14 MG/200ML-% IV SOLN
30.0000 mg/h | INTRAVENOUS | Status: DC
  Filled 2020-10-08: qty 200

## 2020-10-08 MED ORDER — HEPARIN (PORCINE) 25000 UT/250ML-% IV SOLN
1400.0000 [IU]/h | INTRAVENOUS | Status: DC
  Administered 2020-10-09: 1200 [IU]/h via INTRAVENOUS
  Filled 2020-10-08: qty 250

## 2020-10-08 MED ORDER — AMIODARONE HCL IN DEXTROSE 360-4.14 MG/200ML-% IV SOLN
60.0000 mg/h | INTRAVENOUS | Status: AC
  Administered 2020-10-08: 60 mg/h via INTRAVENOUS
  Filled 2020-10-08: qty 200

## 2020-10-08 NOTE — H&P (Signed)
Date: 10/08/2020               Patient Name:  [name] MRN: [id number]  DOB: Feb 10, 1946 Age / Sex: 75 y.o., male   PCP: Marianna Payment, MD         Medical Service: Internal Medicine Teaching Service         Attending Physician: Dr. Aldine Contes, MD    First Contact: Dr. Konrad Penta Pager: [phone]  Second Contact: Dr. Marianna Payment Pager: [phone]       After Hours (After 5p/  First Contact Pager: [phone]  weekends / holidays): Second Contact Pager: [phone]   Chief Complaint: Shortness of breath  History of Present Illness: This is a 75 year old male with a history of hypertension, persistent atrial fibrillation, polycythemia vera, COPD GOLD D, CKD stage 3, nonischemic cardiomyopathy, and OUD on Suboxone who is presenting with a 1 day history shortness of breath, sweating, and non productive cough.  Patient states that for the last few days he has been feeling as well, states that he has been having some chest pressure and feeling like he could not breathe as well, he also endorses a productive cough with white sputum.  This morning when he woke up he felt significantly short of breath, and he called 911 to come and evaluate him.  On EMS arrival he was noted to be diaphoretic, tripoding, and in significant respiratory distress, his oxygen saturation was in the high 80s/low 90s, also noted to be significantly hypertensive with SBP greater than 200 and a heart rate up in the 160s.  He was started on CPAP, given magnesium and nitroglycerin.  On arrival to the hospital he was noted to have symptomatic improvement.  He was placed on BiPAP.  In the ER his heart rate continued to be in the 150s-160s, found to be in A. fib with RVR.  Patient was started on an amiodarone drip and admitted to IMTS.    Patient denies any recent changes in his medications.  Denied any recent sick contacts or recent travel.  He has not had any new environmental exposures.  He states he does not know what  medications he is on at home.  Meds:  Current Meds  Medication Sig  . albuterol (PROAIR HFA) 108 (90 Base) MCG/ACT inhaler Inhale 1-2 puffs into the lungs every 6 (six) hours as needed for wheezing or shortness of breath.  [name] amiodarone (PACERONE) 200 MG tablet TAKE 1/2 TABLET BY MOUTH EVERY DAY  . camphor-menthol (SARNA) lotion Apply topically 3 (three) times daily. (Patient taking differently: Apply 1 application topically daily as needed for itching.)  . ELIQUIS 5 MG TABS tablet TAKE 1 TABLET(5 MG) BY MOUTH TWICE DAILY (Patient taking differently: Take 5 mg by mouth 2 (two) times daily.)  . furosemide (LASIX) 40 MG tablet Take 1 tablet (40 mg total) by mouth 2 (two) times daily.  . roflumilast (DALIRESP) 500 MCG TABS tablet Take 1 tablet (500 mcg total) by mouth daily.  . SUBOXONE 8-2 MG FILM Place 2 Film under the tongue daily.  . SYMBICORT 160-4.5 MCG/ACT inhaler 2 puffs at bedtime.    Allergies: Allergies as of 10/08/2020  . (No Known Allergies)   Past Medical History:  Diagnosis Date  . Acute lower GI bleeding 02/15/2017  . Atrial fibrillation ([place])   . Cardiomyopathy    EF55% 11/14<<35%   . CHF (congestive heart failure) ([place])   . COPD (chronic obstructive pulmonary disease) ([place])   .  Difficult airway for intubation    per telephone encounter notation  . Essential hypertension   . GERD (gastroesophageal reflux disease)   . Gout   . Hepatitis    Possible history  . History of atrial flutter    Ablation 2012  . 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 Obesity   . Pneumonia X 1  . Primary polycythemia ([place]) 06/12/2013  . Renal insufficiency   . Substance abuse ([place])    History of alcohol; hx cocaine, heroin, crack use  . Syncope   . Tubular adenoma of colon 08/2014    Family History:  Family History  Problem Relation Age of Onset  . Diabetes Mother   . Hypertension Mother   . Cirrhosis Father   . Alcohol abuse Father   . Colon cancer Neg Hx   . Rectal  cancer Neg Hx   . Stomach cancer Neg Hx   . Esophageal cancer Neg Hx   . Colon polyps Neg Hx     Social History: Smokes approximately 1 to 2 cigarettes/day.  Denies any recreational drug use. Currently living with a friend and there grandchild.  Currently on disability.  Review of Systems: A complete ROS was negative except as per HPI.   Physical Exam: Blood pressure 108/72, pulse (!) 114, temperature 98.8 F (37.1 C), temperature source Rectal, resp. rate 10, SpO2 96 %. Physical Exam Constitutional:      General: He is in acute distress.     Appearance: He is obese.  HENT:     Head:     Comments: BiPAP in place    Nose: Nose normal.  Eyes:     Extraocular Movements: Extraocular movements intact.     Conjunctiva/sclera: Conjunctivae normal.     Pupils: Pupils are equal, round, and reactive to light.  Cardiovascular:     Rate and Rhythm: Tachycardia present. Rhythm irregular.     Pulses: Normal pulses.     Heart sounds: Normal heart sounds.  Pulmonary:     Comments: Increased WOB, on BiPAP, decreased breath sounds in bibasilar lung fields, no wheezing or rhonchi noted Abdominal:     General: Abdomen is flat. Bowel sounds are normal.     Palpations: Abdomen is soft.     Tenderness: There is no abdominal tenderness. There is no guarding.  Musculoskeletal:        General: No swelling. Normal range of motion.     Cervical back: Normal range of motion and neck supple.  Skin:    General: Skin is warm and dry.     Capillary Refill: Capillary refill takes less than 2 seconds.  Neurological:     General: No focal deficit present.     Mental Status: He is oriented to person, place, and time.  Psychiatric:        Mood and Affect: Mood normal.        Behavior: Behavior normal.     EKG: personally reviewed my interpretation is atrial fibrillation with RVR,   CXR: personally reviewed my interpretation is bilateral congestion, poor inspiration, no infiltrations noted, no  CM  Assessment & Plan by Problem: Active Problems:   Atrial fibrillation with RVR (HCC)   Atrial fibrillation with RVR: Troponemia: Patient has a history of atrial flutter status post ablation in 2012, and now has persistent atrial fibrillation.  Patient noted to be in A. fib with RVR with heart rate up to 150-160s.  He reports a mild productive cough with white sputum and some mild  chest pressure over the past few days.  Denied any infectious symptoms such as fevers, chills, yellow/green sputum production, nausea, vomiting, headaches, lightheadedness, dizziness.  Had not had any recent changes in his medications.  He is currently prescribed amiodarone and Eliquis 5 mg twice a day.  Patient states that he was not sure what medications he is on at home so it is unclear exactly what he is taking..  Last cardiology visit was on 07/16/2020.  He appeared stable at that time.  Unclear trigger for his current presentation.  Unclear if his respiratory status precipitated the atrial fibrillation with RVR or vice versa.  Patient is currently on a amiodarone drip and is heart rate has improved.  He does not appear significantly volume overloaded however chest x-ray showed bilateral pulmonary congestion and BNP is mildly elevated at 250. Troponin went from 54 > 1600. Patient was resting comfortably when this was repeated, no chest pain at this time.   -Repeat EKG  -Cardiology consulted, appreciate recommendations -Continue amiodarone drip -Continue telemetry -Resume home Eliquis 5 mg twice daily -Repeat CBC and BMP in a.m.  Shortness of breath secondary to COPD versus volume overload: Patient presenting with a sudden onset of significant respiratory distress that occurred this morning.  Noted to have hypoxia and required CPAP and BiPAP treatments, also received a dose of magnesium and nitroglycerin by EMS.  His ABG showed a pH of 7.36, CO2 40, and PO2 of 82.  Chest x-ray showed bilateral pulmonary congestion,  no clear infiltrates.  He has been weaned off of BiPAP and now on 3 L nasal cannula.  Patient has a mild leukocytosis of 11.3.  Respiratory panel influenza and Covid negative.  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  Patient does not appear significantly volume overloaded on exam however given his chest x-ray findings, hypoxia, and BNP of 250 may benefit from some gentle diuresis.  -Resume home Roflumilast -Consider Lasix, patient does not appear volume overloaded on exam -Daily weights -Strict I's and O's -Resume home albuterol as needed  Hypertension: Blood pressure has been soft while admitted.  Will monitor for now.  CKD 3: Creatinine is 1.5, most recent creatinines have been around 1.3-1.4.  Patient does not appear significantly volume overloaded.  This appears to be around his current baseline.  Will monitor for now. -Daily BMP  Polycythemia vera: Hemoglobin is 17 today.  Most recent hemoglobins and 15-17. Unclear if patient is currently on any treatment for this. 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  Patient will need to have a follow-up CT in 6 months.  OUD: Patient is on Suboxone 8-2 mg films 2 films under the tongue daily. Reviewed PDMP aware and this is consistent.  -Resume home Suboxone 8-2 mg 2 films daily  Dispo: Admit patient to Inpatient with expected length of stay greater than 2 midnights.  Signed: Asencion Noble, MD 10/08/2020, 2:45 PM  Pager: [phone] After 5pm on weekdays and 1pm on weekends: On Call pager: [phone]

## 2020-10-08 NOTE — ED Provider Notes (Signed)
[facility] EMERGENCY DEPARTMENT Provider Note   CSN: [id number] Arrival date & time: 10/08/20  2633     History Chief Complaint  Patient presents with  . Respiratory Distress    [name] is a 75 y.o. male.  75 year old male with prior medical history as detailed below presents for evaluation.  Patient arrives by EMS for respiratory distress.  Patient reports acute onset of respiratory stress this morning.  Patient was found by EMS to be diaphoretic, tripoding, and in significant respiratory distress.  Sats at home on room air or in the upper 80s to low 90s.  Patient was noticed to be hypertensive with a systolic greater than 354.  Patient's initial heart rate was also in the 160s.  EMS administered CPAP, magnesium, and nitro.  Upon arrival to the ED the patient says that he feels improved.  He was placed immediately on BiPAP.  He reports that his breathing feels better than at home.  He denies significant other complaint or discomfort.  The history is provided by the patient and medical records.  Shortness of Breath Severity:  Moderate Onset quality:  Sudden Duration:  60 minutes Timing:  Unable to specify Progression:  Improving Chronicity:  Recurrent Relieved by:  Nothing Worsened by:  Nothing      Past Medical History:  Diagnosis Date  . Acute lower GI bleeding 02/15/2017  . Atrial fibrillation ([place])   . Cardiomyopathy    EF55% 11/14<<35%   . CHF (congestive heart failure) ([place])   . COPD (chronic obstructive pulmonary disease) ([place])   . Difficult airway for intubation    per telephone encounter notation  . Essential hypertension   . GERD (gastroesophageal reflux disease)   . Gout   . Hepatitis    Possible history  . History of atrial flutter    Ablation 2012  . 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 Obesity   . Pneumonia X 1  . Primary polycythemia ([place]) 06/12/2013  . Renal insufficiency   . Substance abuse ([place])     History of alcohol; hx cocaine, heroin, crack use  . Syncope   . Tubular adenoma of colon 08/2014    Patient Active Problem List   Diagnosis Date Noted  . Hx of adenomatous colonic polyps   . Benign neoplasm of cecum   . Benign neoplasm of ascending colon   . Benign neoplasm of transverse colon   . Benign neoplasm of descending colon   . Benign neoplasm of sigmoid colon   . Acute on chronic systolic CHF (congestive heart failure) ([place]) 04/04/2019  . Acute respiratory failure with hypoxia ([place]) 03/31/2019  . Acute hypoxemic respiratory failure ([place]) 02/09/2019  . COPD exacerbation ([place]) 04/12/2018  . Elevated troponin 04/12/2018  . Long term current use of amiodarone 01/24/2018  . GI bleed 02/15/2017  . Pancytopenia, acquired ([place]) 11/26/2016  . Tubular adenoma 11/25/2016  . Gout 11/02/2016  . Encounter for central line placement 05/25/2016  . Hepatitis C antibody test positive   . Hematuria 11/09/2014  . Acute kidney injury ([place]) 11/01/2014  . Leukopenia due to antineoplastic chemotherapy ([place]) 08/24/2014  . Thrombocytopenia due to drugs 07/25/2014  . Encounter for screening colonoscopy 07/02/2014  . Chronic anticoagulation 07/02/2014  . Acute pulmonary edema ([place]) 06/05/2014  . Prolonged QT interval 06/05/2014  . Sinus bradycardia 06/05/2014  . Opioid dependence ([place]) 06/05/2014  . Diastolic dysfunction 56/25/6389  . COPD (chronic obstructive pulmonary disease) ([place]) 05/30/2014  . Health care maintenance 05/24/2014  .  Acute respiratory failure with hypoxia and hypercapnia ([place]) 04/29/2014  . Anemia due to antineoplastic chemotherapy 04/24/2014  . Thyroid mass 08/26/2013  . Syncope 08/22/2013  . 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 Polycythemia vera ([place]) 01/03/2013  . Malnutrition of moderate degree ([place]) 12/15/2012  . Tobacco abuse 09/15/2012  . 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 Alcoholism ([place]) 09/30/2010  .  Hypertension 09/30/2010  . Atrial fibrillation [facility])     Past Surgical History:  Procedure Laterality Date  . CARDIAC ELECTROPHYSIOLOGY MAPPING AND ABLATION  08/2010   [name] 09/07/2010 (12/14/2012)  . COLONOSCOPY     15-20 years ago had colon in [place]  . COLONOSCOPY WITH PROPOFOL N/A 02/12/2020   Procedure: COLONOSCOPY WITH PROPOFOL;  Surgeon: Ladene Artist, MD;  Location: [facility] ENDOSCOPY;  Service: Endoscopy;  Laterality: N/A;  . EXCISIONAL HEMORRHOIDECTOMY  1970's  . LOOP RECORDER IMPLANT N/A 08/23/2013   Procedure: LOOP RECORDER IMPLANT;  Surgeon: Deboraha Sprang, MD;  Location: [facility] CATH LAB;  Service: Cardiovascular;  Laterality: N/A;  . MULTIPLE EXTRACTIONS WITH ALVEOLOPLASTY Bilateral 01/24/2016   Procedure: MULTIPLE EXTRACTION WITH ALVEOLOPLASTY BILATERAL;  Surgeon: Diona Browner, DDS;  Location: [place];  Service: Oral Surgery;  Laterality: Bilateral;  . MULTIPLE TOOTH EXTRACTIONS  01/24/2016   MULTIPLE EXTRACTION WITH ALVEOLOPLASTY BILATERAL (Bilateral)  . POLYPECTOMY  02/12/2020   Procedure: POLYPECTOMY;  Surgeon: Ladene Artist, MD;  Location: [facility];  Service: Endoscopy;;       Family History  Problem Relation Age of Onset  . Diabetes Mother   . Hypertension Mother   . Cirrhosis Father   . Alcohol abuse Father   . Colon cancer Neg Hx   . Rectal cancer Neg Hx   . Stomach cancer Neg Hx   . Esophageal cancer Neg Hx   . Colon polyps Neg Hx     Social History   Tobacco Use  . Smoking status: Current Every Day Smoker    Packs/day: 1.50    Years: 50.00    Pack years: 75.00    Types: Cigarettes  . Smokeless tobacco: Never Used  . Tobacco comment: 1-2 cigarettes a day  Vaping Use  . Vaping Use: Never used  Substance Use Topics  . Alcohol use: Yes    Alcohol/week: 89.0 standard drinks    Types: 12 Cans of beer, 77 Shots of liquor per week    Comment: Sometimes.  . Drug use: Not Currently    Home Medications Prior to Admission medications   Medication Sig Start  Date End Date Taking? Authorizing Provider  albuterol (PROAIR HFA) 108 (90 Base) MCG/ACT inhaler Inhale 1-2 puffs into the lungs every 6 (six) hours as needed for wheezing or shortness of breath. 01/11/19   [name], MD  amiodarone (PACERONE) 200 MG tablet TAKE 1/2 TABLET BY MOUTH EVERY DAY 04/18/19   Baldwin Jamaica, PA-C  camphor-menthol [facility]) lotion Apply topically 3 (three) times daily. Patient taking differently: Apply 1 application topically daily as needed for itching. 02/13/19   Kayleen Memos, DO  carvedilol (COREG) 3.125 MG tablet Take 9.375 mg by mouth 2 (two) times daily with a meal. 1 tab am, 2 tabs pm    [provider]  ELIQUIS 5 MG TABS tablet TAKE 1 TABLET(5 MG) BY MOUTH TWICE DAILY 08/19/20   Evans Lance, MD  furosemide (LASIX) 40 MG tablet Take 1 tablet (40 mg total) by mouth 2 (two) times daily. 06/08/19   Tommye Standard  Jeani Hawking, PA-C  hydroxyurea (HYDREA) 500 MG capsule Take 2 capsules (1,000 mg total) by mouth daily. MAY TAKE WITH FOOD TO MINIMIZE GI SIDE EFFECTS 07/24/19   Marianna Payment, MD  roflumilast (DALIRESP) 500 MCG TABS tablet Take 1 tablet (500 mcg total) by mouth daily. 07/04/19   Mannam, Hart Robinsons, MD  SUBOXONE 8-2 MG FILM Place 2 Film under the tongue daily. 08/21/15   [provider]  SYMBICORT 160-4.5 MCG/ACT inhaler 2 puffs at bedtime. 05/07/20   [provider]    Allergies    Patient has no known allergies.  Review of Systems   Review of Systems  Respiratory: Positive for shortness of breath.   All other systems reviewed and are negative.   Physical Exam Updated Vital Signs BP 104/84   Pulse 65   Resp (!) 30   SpO2 94%   Physical Exam Vitals and nursing note reviewed.  Constitutional:      General: He is not in acute distress.    Appearance: He is well-developed. He is obese. He is ill-appearing.     Comments: Cpap in progress   HENT:     Head: Normocephalic and atraumatic.  Eyes:     Conjunctiva/sclera:  Conjunctivae normal.     Pupils: Pupils are equal, round, and reactive to light.  Cardiovascular:     Rate and Rhythm: Tachycardia present. Rhythm irregular.     Heart sounds: Normal heart sounds.  Pulmonary:     Effort: Pulmonary effort is normal. No respiratory distress.     Comments: Decreased BS at bases  Abdominal:     General: There is no distension.     Palpations: Abdomen is soft.     Tenderness: There is no abdominal tenderness.  Musculoskeletal:        General: No deformity. Normal range of motion.     Cervical back: Normal range of motion and neck supple.     Right lower leg: Edema present.     Left lower leg: Edema present.     Comments: 1-2 plus BLE edema   Skin:    General: Skin is warm and dry.  Neurological:     General: No focal deficit present.     Mental Status: He is alert and oriented to person, place, and time. Mental status is at baseline.     ED Results / Procedures / Treatments   Labs (all labs ordered are listed, but only abnormal results are displayed) Labs Reviewed  RESP PANEL BY RT-PCR (FLU A&B, COVID) ARPGX2  CULTURE, BLOOD (ROUTINE X 2)  CULTURE, BLOOD (ROUTINE X 2)  BLOOD GAS, ARTERIAL  PROTIME-INR  COMPREHENSIVE METABOLIC PANEL  BRAIN NATRIURETIC PEPTIDE  CBC WITH DIFFERENTIAL/PLATELET  URINALYSIS, ROUTINE W REFLEX MICROSCOPIC  RAPID URINE DRUG SCREEN, HOSP PERFORMED  LACTIC ACID, PLASMA  LACTIC ACID, PLASMA  I-STAT CHEM 8, ED  TROPONIN I (HIGH SENSITIVITY)    EKG EKG Interpretation  Date/Time:  Tuesday October 08 2020 08:22:34 EDT Ventricular Rate:  149 PR Interval:    QRS Duration: 89 QT Interval:  301 QTC Calculation: 474 R Axis:   -11 Text Interpretation: Atrial fibrillation with rapid V-rate Ventricular premature complex Probable LVH with secondary repol abnrm ST depression, probably rate related Confirmed by Dene Gentry [phone]) on 10/08/2020 8:25:37 AM   Radiology No results found.  Procedures Procedures  CRITICAL  CARE Performed by: Valarie Merino   Total critical care time: 45 minutes  Critical care time was exclusive of separately billable procedures and  treating other patients.  Critical care was necessary to treat or prevent imminent or life-threatening deterioration.  Critical care was time spent personally by me on the following activities: development of treatment plan with patient and/or surrogate as well as nursing, discussions with consultants, evaluation of patient's response to treatment, examination of patient, obtaining history from patient or surrogate, ordering and performing treatments and interventions, ordering and review of laboratory studies, ordering and review of radiographic studies, pulse oximetry and re-evaluation of patient's condition.   Medications Ordered in ED Medications  amiodarone (NEXTERONE) 1.8 mg/mL load via infusion 150 mg (has no administration in time range)    Followed by  amiodarone (NEXTERONE PREMIX) 360-4.14 MG/200ML-% (1.8 mg/mL) IV infusion (has no administration in time range)    Followed by  amiodarone (NEXTERONE PREMIX) 360-4.14 MG/200ML-% (1.8 mg/mL) IV infusion (has no administration in time range)    ED Course  I have reviewed the triage vital signs and the nursing notes.  Pertinent labs & imaging results that were available during my care of the patient were reviewed by me and considered in my medical decision making (see chart for details).    MDM Rules/Calculators/A&P                          MDM  Screen complete  [name] was evaluated in Emergency Department on 10/08/2020 for the symptoms described in the history of present illness. He was evaluated in the context of the global COVID-19 pandemic, which necessitated consideration that the patient might be at risk for infection with the SARS-CoV-2 virus that causes COVID-19. Institutional protocols and algorithms that pertain to the evaluation of patients at risk for  COVID-19 are in a state of rapid change based on information released by regulatory bodies including the CDC and federal and state organizations. These policies and algorithms were followed during the patient's care in the ED.  Patient presented for respiratory distress.  Patient's presentation is perhaps most consistent with likely flash pulmonary edema.  Patient placed on BiPAP on arrival.  He tolerated this well.  Initial heart rate was noted to be into the 160's - AF with RVR.  Amiodarone drip initiated.  Rate controlled with amiodarone.  BP improved.  Patient appears to be symptomatically improved as well.  Cardiology consult placed.  Internal medicine teaching team is aware case and will evaluate for admission.   Final Clinical Impression(s) / ED Diagnoses Final diagnoses:  Respiratory distress  Atrial fibrillation, unspecified type [facility])    Rx / DC Orders ED Discharge Orders    None       Valarie Merino, MD 10/08/20 [phone]

## 2020-10-08 NOTE — ED Triage Notes (Signed)
Pt bib ems from home with respiratory distress. Per family pt with hx intubation due to same. Hx copd, chf. Pt cool, clammy on scene in tripod position. Pt placed on CPAP and given 3 SL nitro along with 2g mag en route. HR 160's. Initial BP 992 systolic. Last BP 130/70.

## 2020-10-08 NOTE — Consult Note (Incomplete Revision)
The patient has been seen in conjunction with Kathalene Frames, PA-C. All aspects of care have been considered and discussed. The patient has been personally interviewed, examined, and all clinical data has been reviewed.   Delta troponin I rising above 1000 in the setting of atrial fib with RVR, mild reduction in LV systolic function, in a patient with hypertension, COPD, polycythemia vera, alcohol use, on chronic Eliquis therapy and status post atrial flutter ablation.  The elevated troponin is likely to be demand ischemia related although at his age and with other risk factors underlying coronary atherosclerosis needs to be excluded since according to the patient he has never had an ischemic evaluation.  Recommend left and right heart cath with coronary angiography.  Would avoid ventriculography to decrease contrast exposure.  Will need 2D Doppler echocardiogram.  We interested in LV function only.  Given the current paradigm for atrial fibrillation management, should we consider referral for A. fib ablation since he is having breakthrough atrial fib despite amiodarone therapy.  Dr. Lovena Le will assist [place] with making those decisions.  The patient was counseled to undergo left and right heart catheterization, coronary angiography, and possible percutaneous coronary intervention with stent implantation. The procedural risks and benefits were discussed in detail. The risks discussed included death, stroke, myocardial infarction, life-threatening bleeding, limb ischemia, kidney injury, allergy, and possible emergency cardiac surgery. The risk of these significant complications were estimated to occur less than 1% of the time. After discussion, the patient has agreed to proceed.  Needs to be off Eliquis for 3 doses before heart catheterization that hopefully can be performed from the arm.      Cardiology Consultation:   Patient ID: [name] MRN: [id number]; DOB:  03/15/1946  Admit date: 10/08/2020 Date of Consult: 10/08/2020  PCP:  Marianna Payment, MD   [place]  Cardiologist:  None Advanced Practice Provider:  No care team member to display Electrophysiologist:  Cristopher Peru, MD   Patient Profile:   [name] is a 75 y.o. male with a PMH of chronic combined CHF/NICM, atrial flutter s/p ablation, now with paroxysmal atrial fibrillation on eliquis, HTN, COPD, polycythemia vera, CKD stage 3, polysubstance abuse (ETOH, heroin, cocaine) on suboxone, who is being seen today for the evaluation of acute on chronic SOB and elevated HsTrop at the request of Dr. Dareen Piano.  History of Present Illness:   [name] was in his usual state of health until yesterday when he began experiencing SOB and non-productive coughing. He has been feeling generally unwell for the past couple days. He noticed some chest pressure intermittently as well. This morning his SOB acutely worsened prompting him to activate EMS. On EMS arrival, patient was diaphoretic, tripoding, with O2 sats in the high 80s/low 90s. He was hypertensive with SBP >200s and tachycardic with HR in the 160s. He was started on CPAP, given magnesium and nitroglycerine, and transported to [facility] for further evaluation.   On arrival to the ED, EKG showed atrial fibrillation with RVR, rate 149, with STD in lateral leads. Initially tachycardic to the 160s, BP intermittently soft and he had intermittent tachypnea, satting in the 90s-100% on BiPAP, weaned to O2 3L via [place]. Labs notable for K 3.4, Cr 1.44, WBC 11.3, Hgb 17.5, PLT 275, BNP 250, HsTrop 54>1637, Lactate 2.8>1.6, influenza/COVID-19 negative. CXR suggested mild CHF. He was started on an amiodarone gtt with improvement in HR. Cardiology asked to evaluate.   He has a PMH of chronic  combined CHF dating back to at least 2012 with EF 35%, felt to be non-ischemic in the setting of polysubstance abuse and atrial flutter.  He underwent an atrial flutter ablation in 2012. EF reportedly improved to 55% in 2014, then back down again in 2020 to 35-40%. His most recent echocardiogram 07/2020 showed EF 52%, G1DD, mild LVH, mild LAE, mild MR, and ascending aortic aneurysm (50mm). I do not see evidence of a prior ischemic evaluation. He was last seen by cardiology at an outpatient visit with Tommye Standard, PA-C 06/2020, at which time he was stable from a cardiac perspective with chronic DOE and no complaints of chest pain. Appears he had a similar presentation 01/2019 of unclear etiology (flash pulmonary edema vs CAP vs COPD exacerbation), ultimately discharged home with a cardiac monitor revealing paroxysmal atrial fibrillation with SVR/CVR/RVR, as well as NSR and sinus bradycardia without pauses or other arrhythmias.   At the time of this evaluation, he reports feeling back to baseline. He reports some mild left sided chest discomfort over the past couple days, always associated with tachypalpitations but not clearly exertional in nature. He reports no significant DOE, orthopnea, PND, or LE edema recently. He has been compliant with his medications. No recent illnesses or fevers recently. This morning he initially felt fine, ran an errand, however around 7am when walking to the bathroom he had sudden onset profound SOB with associated racing heart beat sensation.   Past Medical History:  Diagnosis Date  . Acute lower GI bleeding 02/15/2017  . Atrial fibrillation ([place])   . Cardiomyopathy    EF55% 11/14<<35%   . CHF (congestive heart failure) ([place])   . COPD (chronic obstructive pulmonary disease) ([place])   . Difficult airway for intubation    per telephone encounter notation  . Essential hypertension   . GERD (gastroesophageal reflux disease)   . Gout   . Hepatitis    Possible history  . History of atrial flutter    Ablation 2012  . 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 Obesity   . Pneumonia X 1  . Primary  polycythemia ([place]) 06/12/2013  . Renal insufficiency   . Substance abuse ([place])    History of alcohol; hx cocaine, heroin, crack use  . Syncope   . Tubular adenoma of colon 08/2014    Past Surgical History:  Procedure Laterality Date  . CARDIAC ELECTROPHYSIOLOGY MAPPING AND ABLATION  08/2010   [name] 09/07/2010 (12/14/2012)  . COLONOSCOPY     15-20 years ago had colon in [place]  . COLONOSCOPY WITH PROPOFOL N/A 02/12/2020   Procedure: COLONOSCOPY WITH PROPOFOL;  Surgeon: Ladene Artist, MD;  Location: [facility];  Service: Endoscopy;  Laterality: N/A;  . EXCISIONAL HEMORRHOIDECTOMY  1970's  . LOOP RECORDER IMPLANT N/A 08/23/2013   Procedure: LOOP RECORDER IMPLANT;  Surgeon: Deboraha Sprang, MD;  Location: [facility] CATH LAB;  Service: Cardiovascular;  Laterality: N/A;  . MULTIPLE EXTRACTIONS WITH ALVEOLOPLASTY Bilateral 01/24/2016   Procedure: MULTIPLE EXTRACTION WITH ALVEOLOPLASTY BILATERAL;  Surgeon: Diona Browner, DDS;  Location: [place];  Service: Oral Surgery;  Laterality: Bilateral;  . MULTIPLE TOOTH EXTRACTIONS  01/24/2016   MULTIPLE EXTRACTION WITH ALVEOLOPLASTY BILATERAL (Bilateral)  . POLYPECTOMY  02/12/2020   Procedure: POLYPECTOMY;  Surgeon: Ladene Artist, MD;  Location: [facility];  Service: Endoscopy;;     Home Medications:  Prior to Admission medications   Medication Sig Start Date End Date Taking? Authorizing Provider  albuterol (PROAIR HFA) 108 (90 Base) MCG/ACT inhaler Inhale 1-2 puffs  into the lungs every 6 (six) hours as needed for wheezing or shortness of breath. 01/11/19  Yes [name], MD  amiodarone (PACERONE) 200 MG tablet TAKE 1/2 TABLET BY MOUTH EVERY DAY 04/18/19  Yes Baldwin Jamaica, PA-C  camphor-menthol [facility]) lotion Apply topically 3 (three) times daily. Patient taking differently: Apply 1 application topically daily as needed for itching. 02/13/19  Yes Hall, Carole N, DO  ELIQUIS 5 MG TABS tablet TAKE 1 TABLET(5 MG) BY MOUTH TWICE DAILY Patient taking  differently: Take 5 mg by mouth 2 (two) times daily. 08/19/20  Yes Evans Lance, MD  furosemide (LASIX) 40 MG tablet Take 1 tablet (40 mg total) by mouth 2 (two) times daily. 06/08/19  Yes Baldwin Jamaica, PA-C  roflumilast (DALIRESP) 500 MCG TABS tablet Take 1 tablet (500 mcg total) by mouth daily. 07/04/19  Yes Mannam, Praveen, MD  SUBOXONE 8-2 MG FILM Place 2 Film under the tongue daily. 08/21/15  Yes [provider]  SYMBICORT 160-4.5 MCG/ACT inhaler 2 puffs at bedtime. 05/07/20  Yes [provider]    Inpatient Medications: Scheduled Meds: . [START ON 10/09/2020] amiodarone  200 mg Oral Daily  . apixaban  5 mg Oral BID  . buprenorphine-naloxone  2 tablet Sublingual Daily  . roflumilast  500 mcg Oral Daily   Continuous Infusions:  PRN Meds: acetaminophen **OR** acetaminophen, albuterol  Allergies:   No Known Allergies  Social History:   Social History   Socioeconomic History  . Marital status: Single    Spouse name: Not on file  . Number of children: Not on file  . Years of education: Not on file  . Highest education level: Not on file  Occupational History  . Occupation: Retired  Tobacco Use  . Smoking status: Current Every Day Smoker    Packs/day: 1.50    Years: 50.00    Pack years: 75.00    Types: Cigarettes  . Smokeless tobacco: Never Used  . Tobacco comment: 1-2 cigarettes a day  Vaping Use  . Vaping Use: Never used  Substance and Sexual Activity  . Alcohol use: Yes    Alcohol/week: 89.0 standard drinks    Types: 12 Cans of beer, 77 Shots of liquor per week    Comment: Sometimes.  . Drug use: Not Currently  . Sexual activity: Not Currently  Other Topics Concern  . Not on file  Social History Narrative   Moved here from [place]. Lives with common law wife and grandchildren. 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."   Social Determinants of Radio broadcast assistant Strain: Not on file  Food Insecurity: Not on file  Transportation  Needs: Not on file  Physical Activity: Not on file  Stress: Not on file  Social Connections: Not on file  Intimate Partner Violence: Not on file    Family History:    Family History  Problem Relation Age of Onset  . Diabetes Mother   . Hypertension Mother   . Cirrhosis Father   . Alcohol abuse Father   . Colon cancer Neg Hx   . Rectal cancer Neg Hx   . Stomach cancer Neg Hx   . Esophageal cancer Neg Hx   . Colon polyps Neg Hx      ROS:  Please see the history of present illness.   All other ROS reviewed and negative.     Physical Exam/Data:   Vitals:   10/08/20 1330 10/08/20 1430 10/08/20 1515 10/08/20 1600  BP: 108/72 [name])  102/55  104/64  Pulse: (!) 114 80 85   Resp: 10  17   Temp:      TempSrc:      SpO2: 96% 99% 99%    No intake or output data in the 24 hours ending 10/08/20 1655 Last 3 Weights 07/16/2020 02/12/2020 01/29/2020  Weight (lbs) 202 lb 219 lb 199 lb 6.4 oz  Weight (kg) 91.627 kg 99.338 kg 90.447 kg     There is no height or weight on file to calculate BMI.  General:  Well nourished, well developed, in no acute distress HEENT: sclera anicteric Neck: no JVD Endocrine:  No thryomegaly Vascular: No carotid bruits; distal pulses 2+ bilaterally  Cardiac:  normal S1, S2; RRR; no murmurs, rubs, or gallops Lungs:  clear to auscultation bilaterally, no wheezing, rhonchi or rales  Abd: soft, obese, nontender, no hepatomegaly  Ext: no edema Musculoskeletal:  No deformities, BUE and BLE strength normal and equal Skin: warm and dry  Neuro:  CNs 2-12 intact, no focal abnormalities noted Psych:  Normal affect   EKG:  The EKG was personally reviewed and demonstrates:  atrial fibrillation with RVR, rate 149, with STD in lateral leads Telemetry:  Telemetry was personally reviewed and demonstrates:  Initially atrial fibrillation with RVR, now with conversion to sinus rhythm with PACs/PVCs  Relevant CV Studies: Echocardiogram 07/2020: 1. Left ventricular ejection  fraction by 3D volume is 52 %. The left  ventricle has low normal function. The left ventricle has no regional wall  motion abnormalities. There is mild left ventricular hypertrophy. Left  ventricular diastolic parameters are  consistent with Grade I diastolic dysfunction (impaired relaxation).  2. Right ventricular systolic function is normal. The right ventricular  size is normal. There is normal pulmonary artery systolic pressure. The  estimated right ventricular systolic pressure is 87.5 mmHg.  3. Left atrial size was mildly dilated.  4. The mitral valve is grossly normal. Mild mitral valve regurgitation.  No evidence of mitral stenosis.  5. The aortic valve is tricuspid. There is mild calcification of the  aortic valve. There is mild thickening of the aortic valve. Aortic valve  regurgitation is moderate to severe. Mild aortic valve sclerosis is  present, with no evidence of aortic valve  stenosis.  6. Aortic dilatation noted. Aneurysm of the ascending aorta, measuring 51  mm. 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   Laboratory Data:  High Sensitivity Troponin:   Recent Labs  Lab 10/08/20 0825 10/08/20 1131  TROPONINIHS 54* 1,637*     Chemistry Recent Labs  Lab 10/08/20 0825 10/08/20 0853 10/08/20 0912  NA 140 141 143  K 3.4* 3.3* 3.4*  CL 108  --  110  CO2 20*  --   --   GLUCOSE 170*  --  172*  BUN 17  --  20  CREATININE 1.44*  --  1.50*  CALCIUM 8.4*  --   --   GFRNONAA 51*  --   --   ANIONGAP 12  --   --     Recent Labs  Lab 10/08/20 0825  PROT 7.3  ALBUMIN 3.1*  AST 18  ALT 13  ALKPHOS 93  BILITOT 0.7   Hematology Recent Labs  Lab 10/08/20 0825 10/08/20 0853 10/08/20 0912  WBC 11.3*  --   --   RBC 5.53  --   --   HGB 17.5* 16.7 18.0*  HCT 53.9* 49.0 53.0*  MCV 97.5  --   --  MCH 31.6  --   --   MCHC 32.5  --   --   RDW 13.5  --   --   PLT 275  --   --    BNP Recent Labs  Lab 10/08/20 0825  BNP 250.2*     DDimer No results for input(s): DDIMER in the last 168 hours.   Radiology/Studies:  [facility] Chest Port 1 View  Result Date: 10/08/2020 CLINICAL DATA:  Shortness of breath. EXAM: PORTABLE CHEST 1 VIEW COMPARISON:  CT 07/03/2020.  Chest x-ray 05/14/2019, 03/31/2019. FINDINGS: Mediastinum hilar structures normal. Cardiomegaly with mild pulmonary venous congestion bilateral interstitial prominence. Findings suggest mild CHF. Low lung volumes with bibasilar atelectasis. Stable bilateral mild pleural thickening. Costophrenic angles incompletely imaged. No prominent pleural effusion noted. No pneumothorax. Metallic densities noted over the left mandible. IMPRESSION: 1. Cardiomegaly with mild pulmonary venous congestion and bilateral interstitial prominence. Findings suggest mild CHF. 2.  Low lung volumes with bibasilar atelectasis. Electronically Signed   By: Marcello Moores  Register   On: 10/08/2020 08:50     Assessment and Plan:   1. Elevated troponin in patient with coronary artery calcifications on CT: Patient presented with acute on chronic SOB and chest tightness. Hypoxic on EMS arrival improved with CPAP>BiPAP>3L O2 via [place]. EKG showed atrial fibrillation with RVR with rate 149bpm with STD in lateral leads. BNP elevated to 250 (improved from previous). HsTrop 54>1637 (significantly up from prior trop trends in the 100s). He has a known cardiomyopathy, presumed to be NICM, though does not appear to have had any prior ischemic testing. Symptoms do seem to correlate with afib with RVR. - Will plan for [facility] tomorrow to r/o ischemic etiology - Will trend HsTrop to peak - Will check a FLP and A1C for risk stratification  - Will check a limited echo to evaluate LV function and wall motion  2. Paroxysmal atrial fibrillation: patient found to be in atrial fibrillation with RVR on arrival. Rates up to 160s. He was started on an amiodarone gtt with improvement in HR to 80s-90s, now back in . Home eliquis continued.  Seems like he does not tolerate RVR well.  - Will r/o ischemia as above - Will resume home po amiodarone now that he is back in NSR - Hypotension limits AV nodal blocking agents - Could consider alternative antiarrhythmic vs ablation options pending above  3. Chronic combined CHF: Echo 07/2020 with EF ~52%. He denies recent DOE, orthopnea, PND, LE edema, or weight gain. Volume status appears stable recently. He reports feeling back to baseline now. Hypotension limits GDMT. - Will check limited echo to evaluate LV function and wall motion - Hold lasix for now in anticipation of [facility] tomorrow   4. COPD: SOB not clearly related to COPD.  - Continue management per primary team   5. CKD stage 3: Cr 1.44>1.5. Baseline appears to be 1.2-1.3.  - Continue to monitor closely  6. Polysubstance abuse: reports ongoing abstinence  - Continue to encourage avoidance.   Risk Assessment/Risk Scores:   TIMI Risk Score for Unstable Angina or Non-ST Elevation MI:   The patient's TIMI risk score is 2, which indicates a 8% risk of all cause mortality, new or recurrent myocardial infarction or need for urgent revascularization in the next 14 days.  New York Heart Association (NYHA) Functional Class NYHA Class II  CHA2DS2-VASc Score = 2  This indicates a 2.2% annual risk of stroke. The patient's score is based upon: CHF History: Yes HTN History: No  Diabetes History: No Stroke History: No Vascular Disease History: No Age Score: 1 Gender Score: 0       For questions or updates, please contact [facility] Please consult [email or web address] for contact info under    Signed, Abigail Butts, PA-C  10/08/2020 4:55 PM

## 2020-10-08 NOTE — ED Notes (Signed)
Pt taken off of Bipap at this time per MD order. Pt placed on 3L [place]. Resp even and unlabored. In NAD.

## 2020-10-08 NOTE — ED Notes (Signed)
Patient  Moved to ED bed 17 at this time, report from previous shift RN

## 2020-10-08 NOTE — Progress Notes (Addendum)
ANTICOAGULATION CONSULT NOTE - Initial Consult  Pharmacy Consult for heparin Indication: atrial fibrillation  No Known Allergies  Patient Measurements: Height: 5\' 6"  (167.6 cm) Weight: 97.5 kg (215 lb) IBW/kg (Calculated) : 63.8 Heparin Dosing Weight: 85.1 kg  Vital Signs: Temp: 98.8 F (37.1 C) (03/22 0845) Temp Source: Rectal (03/22 0845) BP: 104/64 (03/22 1600) Pulse Rate: 70 (03/22 1715)  Labs: Recent Labs    10/08/20 0825 10/08/20 0853 10/08/20 0912 10/08/20 1131  HGB 17.5* 16.7 18.0*  --   HCT 53.9* 49.0 53.0*  --   PLT 275  --   --   --   LABPROT 16.6*  --   --   --   INR 1.4*  --   --   --   CREATININE 1.44*  --  1.50*  --   TROPONINIHS 54*  --   --  1,637*    Estimated Creatinine Clearance: 48 mL/min (A) (by C-G formula based on SCr of 1.5 mg/dL (H)).   Medical History: Past Medical History:  Diagnosis Date  . Acute lower GI bleeding 02/15/2017  . Atrial fibrillation ([place])   . Cardiomyopathy    EF55% 11/14<<35%   . CHF (congestive heart failure) ([place])   . COPD (chronic obstructive pulmonary disease) ([place])   . Difficult airway for intubation    per telephone encounter notation  . Essential hypertension   . GERD (gastroesophageal reflux disease)   . Gout   . Hepatitis    Possible history  . History of atrial flutter    Ablation 2012  . 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 Obesity   . Pneumonia X 1  . Primary polycythemia ([place]) 06/12/2013  . Renal insufficiency   . Substance abuse ([place])    History of alcohol; hx cocaine, heroin, crack use  . Syncope   . Tubular adenoma of colon 08/2014    Assessment: 17 YOM admitted with elevated troponins and afib with RVR on Eliquis PTA (LD 3/22 @1330 ). Pharmacy has been consulted to dose heparin. Planning cath 3/23 or 3/24.   Goal of Therapy:  Heparin level 0.3-0.7 units/ml aPTT 66-102 seconds Monitor platelets by anticoagulation protocol: Yes   Plan:  Heparin 1200 units/hr starting on  3/23 at 0130 aPTT in 8 hours Daily aPTT, heparin level, CBC  Romilda Garret, PharmD PGY1 Acute Care Pharmacy Resident 10/08/2020 6:29 PM  Please check [email or web address] for unit specific pharmacy phone numbers.

## 2020-10-08 NOTE — Consult Note (Addendum)
The patient has been seen in conjunction with Kathalene Frames, PA-C. All aspects of care have been considered and discussed. The patient has been personally interviewed, examined, and all clinical data has been reviewed.   Delta troponin I rising above 1000 in the setting of atrial fib with RVR, mild reduction in LV systolic function, in a patient with hypertension, COPD, polycythemia vera, alcohol use, on chronic Eliquis therapy and status post atrial flutter ablation.  The elevated troponin is likely to be demand ischemia related although at his age and with other risk factors underlying coronary atherosclerosis needs to be excluded since according to the patient he has never had an ischemic evaluation.  Recommend left and right heart cath with coronary angiography.  Would avoid ventriculography to decrease contrast exposure.  Will need 2D Doppler echocardiogram.  We interested in LV function only.  Given the current paradigm for atrial fibrillation management, should we consider referral for A. fib ablation since he is having breakthrough atrial fib despite amiodarone therapy.  Dr. Lovena Le will assist [place] with making those decisions.  The patient was counseled to undergo left and right heart catheterization, coronary angiography, and possible percutaneous coronary intervention with stent implantation. The procedural risks and benefits were discussed in detail. The risks discussed included death, stroke, myocardial infarction, life-threatening bleeding, limb ischemia, kidney injury, allergy, and possible emergency cardiac surgery. The risk of these significant complications were estimated to occur less than 1% of the time. After discussion, the patient has agreed to proceed.  Needs to be off Eliquis for 3 doses before heart catheterization that hopefully can be performed from the arm.      Cardiology Consultation:   Patient ID: [name] MRN: [id number]; DOB:  01-29-1946  Admit date: 10/08/2020 Date of Consult: 10/08/2020  PCP:  Marianna Payment, MD   [place]  Cardiologist:  None Advanced Practice Provider:  No care team member to display Electrophysiologist:  Cristopher Peru, MD   Patient Profile:   [name] is a 75 y.o. male with a PMH of chronic combined CHF/NICM, atrial flutter s/p ablation, now with paroxysmal atrial fibrillation on eliquis, HTN, COPD, polycythemia vera, CKD stage 3, polysubstance abuse (ETOH, heroin, cocaine) on suboxone, who is being seen today for the evaluation of acute on chronic SOB and elevated HsTrop at the request of Dr. Dareen Piano.  History of Present Illness:   [name] was in his usual state of health until yesterday when he began experiencing SOB and non-productive coughing. He has been feeling generally unwell for the past couple days. He noticed some chest pressure intermittently as well. This morning his SOB acutely worsened prompting him to activate EMS. On EMS arrival, patient was diaphoretic, tripoding, with O2 sats in the high 80s/low 90s. He was hypertensive with SBP >200s and tachycardic with HR in the 160s. He was started on CPAP, given magnesium and nitroglycerine, and transported to [facility] for further evaluation.   On arrival to the ED, EKG showed atrial fibrillation with RVR, rate 149, with STD in lateral leads. Initially tachycardic to the 160s, BP intermittently soft and he had intermittent tachypnea, satting in the 90s-100% on BiPAP, weaned to O2 3L via [place]. Labs notable for K 3.4, Cr 1.44, WBC 11.3, Hgb 17.5, PLT 275, BNP 250, HsTrop 54>1637, Lactate 2.8>1.6, influenza/COVID-19 negative. CXR suggested mild CHF. He was started on an amiodarone gtt with improvement in HR. Cardiology asked to evaluate.   He has a PMH of chronic  combined CHF dating back to at least 2012 with EF 35%, felt to be non-ischemic in the setting of polysubstance abuse and atrial flutter.  He underwent an atrial flutter ablation in 2012. EF reportedly improved to 55% in 2014, then back down again in 2020 to 35-40%. 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 I do not see evidence of a prior ischemic evaluation. He was last seen by cardiology at an outpatient visit with Tommye Standard, PA-C 06/2020, at which time he was stable from a cardiac perspective with chronic DOE and no complaints of chest pain. Appears he had a similar presentation 01/2019 of unclear etiology (flash pulmonary edema vs CAP vs COPD exacerbation), ultimately discharged home with a cardiac monitor revealing paroxysmal atrial fibrillation with SVR/CVR/RVR, as well as NSR and sinus bradycardia without pauses or other arrhythmias.   At the time of this evaluation, he reports feeling back to baseline. He reports some mild left sided chest discomfort over the past couple days, always associated with tachypalpitations but not clearly exertional in nature. He reports no significant DOE, orthopnea, PND, or LE edema recently. He has been compliant with his medications. No recent illnesses or fevers recently. This morning he initially felt fine, ran an errand, however around 7am when walking to the bathroom he had sudden onset profound SOB with associated racing heart beat sensation.   Past Medical History:  Diagnosis Date  . Acute lower GI bleeding 02/15/2017  . Atrial fibrillation ([place])   . Cardiomyopathy    EF55% 11/14<<35%   . CHF (congestive heart failure) ([place])   . COPD (chronic obstructive pulmonary disease) ([place])   . Difficult airway for intubation    per telephone encounter notation  . Essential hypertension   . GERD (gastroesophageal reflux disease)   . Gout   . Hepatitis    Possible history  . History of atrial flutter    Ablation 2012  . 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 Obesity   . Pneumonia X 1  . Primary  polycythemia ([place]) 06/12/2013  . Renal insufficiency   . Substance abuse ([place])    History of alcohol; hx cocaine, heroin, crack use  . Syncope   . Tubular adenoma of colon 08/2014    Past Surgical History:  Procedure Laterality Date  . CARDIAC ELECTROPHYSIOLOGY MAPPING AND ABLATION  08/2010   [name] 09/07/2010 (12/14/2012)  . COLONOSCOPY     15-20 years ago had colon in [place]  . COLONOSCOPY WITH PROPOFOL N/A 02/12/2020   Procedure: COLONOSCOPY WITH PROPOFOL;  Surgeon: Ladene Artist, MD;  Location: [facility] ENDOSCOPY;  Service: Endoscopy;  Laterality: N/A;  . EXCISIONAL HEMORRHOIDECTOMY  1970's  . LOOP RECORDER IMPLANT N/A 08/23/2013   Procedure: LOOP RECORDER IMPLANT;  Surgeon: Deboraha Sprang, MD;  Location: [facility] CATH LAB;  Service: Cardiovascular;  Laterality: N/A;  . MULTIPLE EXTRACTIONS WITH ALVEOLOPLASTY Bilateral 01/24/2016   Procedure: MULTIPLE EXTRACTION WITH ALVEOLOPLASTY BILATERAL;  Surgeon: Diona Browner, DDS;  Location: [facility];  Service: Oral Surgery;  Laterality: Bilateral;  . MULTIPLE TOOTH EXTRACTIONS  01/24/2016   MULTIPLE EXTRACTION WITH ALVEOLOPLASTY BILATERAL (Bilateral)  . POLYPECTOMY  02/12/2020   Procedure: POLYPECTOMY;  Surgeon: Ladene Artist, MD;  Location: [facility];  Service: Endoscopy;;     Home Medications:  Prior to Admission medications   Medication Sig Start Date End Date Taking? Authorizing Provider  albuterol (PROAIR HFA) 108 (90 Base) MCG/ACT inhaler Inhale 1-2 puffs  into the lungs every 6 (six) hours as needed for wheezing or shortness of breath. 01/11/19  Yes [name], MD  amiodarone (PACERONE) 200 MG tablet TAKE 1/2 TABLET BY MOUTH EVERY DAY 04/18/19  Yes Baldwin Jamaica, PA-C  camphor-menthol [facility]) lotion Apply topically 3 (three) times daily. Patient taking differently: Apply 1 application topically daily as needed for itching. 02/13/19  Yes Hall, Carole N, DO  ELIQUIS 5 MG TABS tablet TAKE 1 TABLET(5 MG) BY MOUTH TWICE DAILY Patient taking  differently: Take 5 mg by mouth 2 (two) times daily. 08/19/20  Yes Evans Lance, MD  furosemide (LASIX) 40 MG tablet Take 1 tablet (40 mg total) by mouth 2 (two) times daily. 06/08/19  Yes Baldwin Jamaica, PA-C  roflumilast (DALIRESP) 500 MCG TABS tablet Take 1 tablet (500 mcg total) by mouth daily. 07/04/19  Yes Mannam, Praveen, MD  SUBOXONE 8-2 MG FILM Place 2 Film under the tongue daily. 08/21/15  Yes [provider]  SYMBICORT 160-4.5 MCG/ACT inhaler 2 puffs at bedtime. 05/07/20  Yes [provider]    Inpatient Medications: Scheduled Meds: . [START ON 10/09/2020] amiodarone  200 mg Oral Daily  . apixaban  5 mg Oral BID  . buprenorphine-naloxone  2 tablet Sublingual Daily  . roflumilast  500 mcg Oral Daily   Continuous Infusions:  PRN Meds: acetaminophen **OR** acetaminophen, albuterol  Allergies:   No Known Allergies  Social History:   Social History   Socioeconomic History  . Marital status: Single    Spouse name: Not on file  . Number of children: Not on file  . Years of education: Not on file  . Highest education level: Not on file  Occupational History  . Occupation: Retired  Tobacco Use  . Smoking status: Current Every Day Smoker    Packs/day: 1.50    Years: 50.00    Pack years: 75.00    Types: Cigarettes  . Smokeless tobacco: Never Used  . Tobacco comment: 1-2 cigarettes a day  Vaping Use  . Vaping Use: Never used  Substance and Sexual Activity  . Alcohol use: Yes    Alcohol/week: 89.0 standard drinks    Types: 12 Cans of beer, 77 Shots of liquor per week    Comment: Sometimes.  . Drug use: Not Currently  . Sexual activity: Not Currently  Other Topics Concern  . Not on file  Social History Narrative   Moved here from [place]. Lives with common law wife and grandchildren. 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."   Social Determinants of Radio broadcast assistant Strain: Not on file  Food Insecurity: Not on file  Transportation  Needs: Not on file  Physical Activity: Not on file  Stress: Not on file  Social Connections: Not on file  Intimate Partner Violence: Not on file    Family History:    Family History  Problem Relation Age of Onset  . Diabetes Mother   . Hypertension Mother   . Cirrhosis Father   . Alcohol abuse Father   . Colon cancer Neg Hx   . Rectal cancer Neg Hx   . Stomach cancer Neg Hx   . Esophageal cancer Neg Hx   . Colon polyps Neg Hx      ROS:  Please see the history of present illness.   All other ROS reviewed and negative.     Physical Exam/Data:   Vitals:   10/08/20 1330 10/08/20 1430 10/08/20 1515 10/08/20 1600  BP: 108/72 [name])  102/55  104/64  Pulse: (!) 114 80 85   Resp: 10  17   Temp:      TempSrc:      SpO2: 96% 99% 99%    No intake or output data in the 24 hours ending 10/08/20 1655 Last 3 Weights 07/16/2020 02/12/2020 01/29/2020  Weight (lbs) 202 lb 219 lb 199 lb 6.4 oz  Weight (kg) 91.627 kg 99.338 kg 90.447 kg     There is no height or weight on file to calculate BMI.  General:  Well nourished, well developed, in no acute distress HEENT: sclera anicteric Neck: no JVD Endocrine:  No thryomegaly Vascular: No carotid bruits; distal pulses 2+ bilaterally  Cardiac:  normal S1, S2; RRR; no murmurs, rubs, or gallops Lungs:  clear to auscultation bilaterally, no wheezing, rhonchi or rales  Abd: soft, obese, nontender, no hepatomegaly  Ext: no edema Musculoskeletal:  No deformities, BUE and BLE strength normal and equal Skin: warm and dry  Neuro:  CNs 2-12 intact, no focal abnormalities noted Psych:  Normal affect   EKG:  The EKG was personally reviewed and demonstrates:  atrial fibrillation with RVR, rate 149, with STD in lateral leads Telemetry:  Telemetry was personally reviewed and demonstrates:  Initially atrial fibrillation with RVR, now with conversion to sinus rhythm with PACs/PVCs  Relevant CV Studies: Echocardiogram 07/2020: 1. Left ventricular ejection  fraction by 3D volume is 52 %. The left  ventricle has low normal function. The left ventricle has no regional wall  motion abnormalities. There is mild left ventricular hypertrophy. Left  ventricular diastolic parameters are  consistent with Grade I diastolic dysfunction (impaired relaxation).  2. Right ventricular systolic function is normal. The right ventricular  size is normal. There is normal pulmonary artery systolic pressure. The  estimated right ventricular systolic pressure is 86.5 mmHg.  3. Left atrial size was mildly dilated.  4. The mitral valve is grossly normal. Mild mitral valve regurgitation.  No evidence of mitral stenosis.  5. The aortic valve is tricuspid. There is mild calcification of the  aortic valve. There is mild thickening of the aortic valve. Aortic valve  regurgitation is moderate to severe. Mild aortic valve sclerosis is  present, with no evidence of aortic valve  stenosis.  6. Aortic dilatation noted. Aneurysm of the ascending aorta, measuring 51  mm. 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   Laboratory Data:  High Sensitivity Troponin:   Recent Labs  Lab 10/08/20 0825 10/08/20 1131  TROPONINIHS 54* 1,637*     Chemistry Recent Labs  Lab 10/08/20 0825 10/08/20 0853 10/08/20 0912  NA 140 141 143  K 3.4* 3.3* 3.4*  CL 108  --  110  CO2 20*  --   --   GLUCOSE 170*  --  172*  BUN 17  --  20  CREATININE 1.44*  --  1.50*  CALCIUM 8.4*  --   --   GFRNONAA 51*  --   --   ANIONGAP 12  --   --     Recent Labs  Lab 10/08/20 0825  PROT 7.3  ALBUMIN 3.1*  AST 18  ALT 13  ALKPHOS 93  BILITOT 0.7   Hematology Recent Labs  Lab 10/08/20 0825 10/08/20 0853 10/08/20 0912  WBC 11.3*  --   --   RBC 5.53  --   --   HGB 17.5* 16.7 18.0*  HCT 53.9* 49.0 53.0*  MCV 97.5  --   --  MCH 31.6  --   --   MCHC 32.5  --   --   RDW 13.5  --   --   PLT 275  --   --    BNP Recent Labs  Lab 10/08/20 0825  BNP 250.2*     DDimer No results for input(s): DDIMER in the last 168 hours.   Radiology/Studies:  [facility] Chest Port 1 View  Result Date: 10/08/2020 CLINICAL DATA:  Shortness of breath. EXAM: PORTABLE CHEST 1 VIEW COMPARISON:  CT 07/03/2020.  Chest x-ray 05/14/2019, 03/31/2019. FINDINGS: Mediastinum hilar structures normal. Cardiomegaly with mild pulmonary venous congestion bilateral interstitial prominence. Findings suggest mild CHF. Low lung volumes with bibasilar atelectasis. Stable bilateral mild pleural thickening. Costophrenic angles incompletely imaged. No prominent pleural effusion noted. No pneumothorax. Metallic densities noted over the left mandible. IMPRESSION: 1. Cardiomegaly with mild pulmonary venous congestion and bilateral interstitial prominence. Findings suggest mild CHF. 2.  Low lung volumes with bibasilar atelectasis. Electronically Signed   By: Marcello Moores  Register   On: 10/08/2020 08:50     Assessment and Plan:   1. Elevated troponin in patient with coronary artery calcifications on CT: Patient presented with acute on chronic SOB and chest tightness. Hypoxic on EMS arrival improved with CPAP>BiPAP>3L O2 via [place]. EKG showed atrial fibrillation with RVR with rate 149bpm with STD in lateral leads. BNP elevated to 250 (improved from previous). HsTrop 54>1637 (significantly up from prior trop trends in the 100s). He has a known cardiomyopathy, presumed to be NICM, though does not appear to have had any prior ischemic testing. Symptoms do seem to correlate with afib with RVR. - Will plan for [facility] tomorrow to r/o ischemic etiology - Will trend HsTrop to peak - Will check a FLP and A1C for risk stratification  - Will check a limited echo to evaluate LV function and wall motion  2. Paroxysmal atrial fibrillation: patient found to be in atrial fibrillation with RVR on arrival. Rates up to 160s. He was started on an amiodarone gtt with improvement in HR to 80s-90s, now back in . Home eliquis continued.  Seems like he does not tolerate RVR well.  - Will r/o ischemia as above - Will resume home po amiodarone now that he is back in NSR - Hypotension limits AV nodal blocking agents - Could consider alternative antiarrhythmic vs ablation options pending above  3. Chronic combined CHF: Echo 07/2020 with EF ~52%. He denies recent DOE, orthopnea, PND, LE edema, or weight gain. Volume status appears stable recently. He reports feeling back to baseline now. Hypotension limits GDMT. - Will check limited echo to evaluate LV function and wall motion - Hold lasix for now in anticipation of [facility] tomorrow   4. COPD: SOB not clearly related to COPD.  - Continue management per primary team   5. CKD stage 3: Cr 1.44>1.5. Baseline appears to be 1.2-1.3.  - Continue to monitor closely  6. Polysubstance abuse: reports ongoing abstinence  - Continue to encourage avoidance.   Risk Assessment/Risk Scores:   TIMI Risk Score for Unstable Angina or Non-ST Elevation MI:   The patient's TIMI risk score is 2, which indicates a 8% risk of all cause mortality, new or recurrent myocardial infarction or need for urgent revascularization in the next 14 days.  New York Heart Association (NYHA) Functional Class NYHA Class II  CHA2DS2-VASc Score = 2  This indicates a 2.2% annual risk of stroke. The patient's score is based upon: CHF History: Yes HTN History: No  Diabetes History: No Stroke History: No Vascular Disease History: No Age Score: 1 Gender Score: 0       For questions or updates, please contact [facility] Please consult [email or web address] for contact info under    Signed, Abigail Butts, PA-C  10/08/2020 4:55 PM

## 2020-10-08 NOTE — ED Notes (Signed)
Admitting MD paged regarding troponin level. Repeat EKG obtained. Pt denies chest pain.

## 2020-10-08 NOTE — ED Notes (Addendum)
Pt standing independently with urinal. Pt was also given bedside commode. Pt asked for privacy.

## 2020-10-09 ENCOUNTER — Encounter (HOSPITAL_COMMUNITY)
Admission: EM | Disposition: A | Discharge status: Home / Self Care | Payer: Self-pay | Source: Home / Self Care | Attending: Internal Medicine

## 2020-10-09 ENCOUNTER — Inpatient Hospital Stay (HOSPITAL_COMMUNITY): Payer: Medicare Other

## 2020-10-09 ENCOUNTER — Encounter (HOSPITAL_COMMUNITY): Payer: Self-pay | Admitting: Internal Medicine

## 2020-10-09 DIAGNOSIS — I480 Paroxysmal atrial fibrillation: Secondary | ICD-10-CM

## 2020-10-09 DIAGNOSIS — M545 Low back pain, unspecified: Secondary | ICD-10-CM

## 2020-10-09 DIAGNOSIS — R0603 Acute respiratory distress: Secondary | ICD-10-CM

## 2020-10-09 DIAGNOSIS — I5043 Acute on chronic combined systolic (congestive) and diastolic (congestive) heart failure: Secondary | ICD-10-CM

## 2020-10-09 DIAGNOSIS — I214 Non-ST elevation (NSTEMI) myocardial infarction: Secondary | ICD-10-CM

## 2020-10-09 DIAGNOSIS — N1831 Chronic kidney disease, stage 3a: Secondary | ICD-10-CM

## 2020-10-09 DIAGNOSIS — I10 Essential (primary) hypertension: Secondary | ICD-10-CM

## 2020-10-09 HISTORY — PX: RIGHT/LEFT HEART CATH AND CORONARY ANGIOGRAPHY: CATH118266

## 2020-10-09 LAB — POCT I-STAT 7, (LYTES, BLD GAS, ICA,H+H)
Acid-base deficit: 1 mmol/L (ref 0.0–2.0)
Bicarbonate: 24.4 mmol/L (ref 20.0–28.0)
Calcium, Ion: 1.05 mmol/L — ABNORMAL LOW (ref 1.15–1.40)
HCT: 42 % (ref 39.0–52.0)
Hemoglobin: 14.3 g/dL (ref 13.0–17.0)
O2 Saturation: 99 %
Potassium: 4 mmol/L (ref 3.5–5.1)
Sodium: 144 mmol/L (ref 135–145)
TCO2: 26 mmol/L (ref 22–32)
pCO2 arterial: 44 mmHg (ref 32.0–48.0)
pH, Arterial: 7.352 (ref 7.350–7.450)
pO2, Arterial: 139 mmHg — ABNORMAL HIGH (ref 83.0–108.0)

## 2020-10-09 LAB — BASIC METABOLIC PANEL
Anion gap: 8 (ref 5–15)
BUN: 24 mg/dL — ABNORMAL HIGH (ref 8–23)
CO2: 23 mmol/L (ref 22–32)
Calcium: 8.2 mg/dL — ABNORMAL LOW (ref 8.9–10.3)
Chloride: 106 mmol/L (ref 98–111)
Creatinine, Ser: 1.32 mg/dL — ABNORMAL HIGH (ref 0.61–1.24)
GFR, Estimated: 57 mL/min — ABNORMAL LOW (ref >60)
Glucose, Bld: 114 mg/dL — ABNORMAL HIGH (ref 70–99)
Potassium: 3.8 mmol/L (ref 3.5–5.1)
Sodium: 137 mmol/L (ref 135–145)

## 2020-10-09 LAB — ECHOCARDIOGRAM LIMITED
Area-P 1/2: 4.82 cm2
Calc EF: 39.9 %
Height: 66 in
MV M vel: 5 m/s
MV Peak grad: 100 mmHg
P 1/2 time: 383 msec
Radius: 0.6 cm
S' Lateral: 5 cm
Single Plane A2C EF: 33.3 %
Single Plane A4C EF: 46 %
Weight: 3355.2 oz

## 2020-10-09 LAB — LIPID PANEL
Cholesterol: 133 mg/dL (ref 0–200)
HDL: 31 mg/dL — ABNORMAL LOW (ref >40)
LDL Cholesterol: 83 mg/dL (ref 0–99)
Total CHOL/HDL Ratio: 4.3 RATIO
Triglycerides: 96 mg/dL (ref <150)
VLDL: 19 mg/dL (ref 0–40)

## 2020-10-09 LAB — POCT I-STAT EG7
Acid-base deficit: 1 mmol/L (ref 0.0–2.0)
Bicarbonate: 25.3 mmol/L (ref 20.0–28.0)
Calcium, Ion: 1.05 mmol/L — ABNORMAL LOW (ref 1.15–1.40)
HCT: 43 % (ref 39.0–52.0)
Hemoglobin: 14.6 g/dL (ref 13.0–17.0)
O2 Saturation: 66 %
Potassium: 4.1 mmol/L (ref 3.5–5.1)
Sodium: 144 mmol/L (ref 135–145)
TCO2: 27 mmol/L (ref 22–32)
pCO2, Ven: 46.4 mmHg (ref 44.0–60.0)
pH, Ven: 7.344 (ref 7.250–7.430)
pO2, Ven: 36 mmHg (ref 32.0–45.0)

## 2020-10-09 LAB — HEMOGLOBIN A1C
Hgb A1c MFr Bld: 5.8 % — ABNORMAL HIGH (ref 4.8–5.6)
Mean Plasma Glucose: 119.76 mg/dL

## 2020-10-09 LAB — CBC
HCT: 47.2 % (ref 39.0–52.0)
Hemoglobin: 15.6 g/dL (ref 13.0–17.0)
MCH: 31.3 pg (ref 26.0–34.0)
MCHC: 33.1 g/dL (ref 30.0–36.0)
MCV: 94.6 fL (ref 80.0–100.0)
Platelets: 232 10*3/uL (ref 150–400)
RBC: 4.99 MIL/uL (ref 4.22–5.81)
RDW: 13.6 % (ref 11.5–15.5)
WBC: 10.3 10*3/uL (ref 4.0–10.5)
nRBC: 0 % (ref 0.0–0.2)

## 2020-10-09 LAB — PHOSPHORUS: Phosphorus: 3.4 mg/dL (ref 2.5–4.6)

## 2020-10-09 LAB — TROPONIN I (HIGH SENSITIVITY): Troponin I (High Sensitivity): 5587 ng/L (ref <18)

## 2020-10-09 LAB — APTT: aPTT: 37 seconds — ABNORMAL HIGH (ref 24–36)

## 2020-10-09 LAB — MAGNESIUM: Magnesium: 1.9 mg/dL (ref 1.7–2.4)

## 2020-10-09 SURGERY — RIGHT/LEFT HEART CATH AND CORONARY ANGIOGRAPHY
Anesthesia: LOCAL

## 2020-10-09 MED ORDER — HEPARIN (PORCINE) IN NACL 1000-0.9 UT/500ML-% IV SOLN
INTRAVENOUS | Status: AC
  Filled 2020-10-09: qty 1000

## 2020-10-09 MED ORDER — HEPARIN (PORCINE) IN NACL 1000-0.9 UT/500ML-% IV SOLN
INTRAVENOUS | Status: DC | PRN
  Administered 2020-10-09 (×2): 500 mL

## 2020-10-09 MED ORDER — AMIODARONE HCL IN DEXTROSE 360-4.14 MG/200ML-% IV SOLN
30.0000 mg/h | INTRAVENOUS | Status: DC
  Administered 2020-10-09 – 2020-10-11 (×4): 30 mg/h via INTRAVENOUS
  Filled 2020-10-09 (×4): qty 200

## 2020-10-09 MED ORDER — SODIUM CHLORIDE 0.9% FLUSH: 3.0000 mL | INTRAVENOUS | Status: DC | PRN

## 2020-10-09 MED ORDER — METHOCARBAMOL 500 MG PO TABS
750.0000 mg | ORAL_TABLET | Freq: Three times a day (TID) | ORAL | Status: DC | PRN
  Administered 2020-10-09 – 2020-10-13 (×2): 750 mg via ORAL
  Filled 2020-10-09 (×2): qty 2

## 2020-10-09 MED ORDER — VERAPAMIL HCL 2.5 MG/ML IV SOLN
INTRAVENOUS | Status: AC
  Filled 2020-10-09: qty 2

## 2020-10-09 MED ORDER — FENTANYL CITRATE (PF) 100 MCG/2ML IJ SOLN
INTRAMUSCULAR | Status: DC | PRN
  Administered 2020-10-09 (×3): 25 ug via INTRAVENOUS

## 2020-10-09 MED ORDER — SODIUM CHLORIDE 0.9% FLUSH
3.0000 mL | INTRAVENOUS | Status: DC | PRN
Start: 2020-10-09 — End: 2020-10-09

## 2020-10-09 MED ORDER — SODIUM CHLORIDE 0.9 % IV SOLN: 250.0000 mL | INTRAVENOUS | Status: DC | PRN

## 2020-10-09 MED ORDER — METOPROLOL SUCCINATE ER 25 MG PO TB24: 25.0000 mg | ORAL_TABLET | Freq: Every day | ORAL | Status: DC

## 2020-10-09 MED ORDER — LABETALOL HCL 5 MG/ML IV SOLN: 10.0000 mg | INTRAVENOUS | Status: DC | PRN

## 2020-10-09 MED ORDER — IOHEXOL 350 MG/ML SOLN
INTRAVENOUS | Status: DC | PRN
  Administered 2020-10-09: 70 mL

## 2020-10-09 MED ORDER — LIDOCAINE HCL (PF) 1 % IJ SOLN
INTRAMUSCULAR | Status: DC | PRN
  Administered 2020-10-09: 2 mL
  Administered 2020-10-09: 15 mL

## 2020-10-09 MED ORDER — POTASSIUM CHLORIDE CRYS ER 20 MEQ PO TBCR: 40.0000 meq | EXTENDED_RELEASE_TABLET | Freq: Once | ORAL | Status: DC

## 2020-10-09 MED ORDER — SODIUM CHLORIDE 0.9 % IV SOLN: INTRAVENOUS | Status: DC

## 2020-10-09 MED ORDER — CLOPIDOGREL BISULFATE 75 MG PO TABS
600.0000 mg | ORAL_TABLET | Freq: Once | ORAL | Status: AC
  Administered 2020-10-09: 600 mg via ORAL
  Filled 2020-10-09: qty 8

## 2020-10-09 MED ORDER — ASPIRIN 81 MG PO CHEW
81.0000 mg | CHEWABLE_TABLET | ORAL | Status: AC
  Administered 2020-10-09: 81 mg via ORAL
  Filled 2020-10-09: qty 1

## 2020-10-09 MED ORDER — LIDOCAINE HCL (PF) 1 % IJ SOLN
INTRAMUSCULAR | Status: AC
  Filled 2020-10-09: qty 30

## 2020-10-09 MED ORDER — CLOPIDOGREL BISULFATE 75 MG PO TABS
75.0000 mg | ORAL_TABLET | Freq: Every day | ORAL | Status: DC
  Administered 2020-10-10: 75 mg via ORAL
  Filled 2020-10-09: qty 1

## 2020-10-09 MED ORDER — SODIUM CHLORIDE 0.9% FLUSH
3.0000 mL | Freq: Two times a day (BID) | INTRAVENOUS | Status: DC
  Administered 2020-10-09: 3 mL via INTRAVENOUS

## 2020-10-09 MED ORDER — FENTANYL CITRATE (PF) 100 MCG/2ML IJ SOLN
INTRAMUSCULAR | Status: AC
  Filled 2020-10-09: qty 2

## 2020-10-09 MED ORDER — HEPARIN SODIUM (PORCINE) 1000 UNIT/ML IJ SOLN
INTRAMUSCULAR | Status: AC
  Filled 2020-10-09: qty 1

## 2020-10-09 MED ORDER — METOPROLOL SUCCINATE ER 50 MG PO TB24
50.0000 mg | ORAL_TABLET | Freq: Every day | ORAL | Status: DC
  Administered 2020-10-10: 50 mg via ORAL
  Filled 2020-10-09 (×2): qty 1

## 2020-10-09 MED ORDER — HYDRALAZINE HCL 20 MG/ML IJ SOLN: 10.0000 mg | INTRAMUSCULAR | Status: DC | PRN

## 2020-10-09 MED ORDER — SODIUM CHLORIDE 0.9% FLUSH: 3.0000 mL | Freq: Two times a day (BID) | INTRAVENOUS | Status: DC

## 2020-10-09 MED ORDER — ASPIRIN 81 MG PO CHEW
81.0000 mg | CHEWABLE_TABLET | ORAL | Status: AC
  Administered 2020-10-10: 81 mg via ORAL
  Filled 2020-10-09: qty 1

## 2020-10-09 MED ORDER — METOPROLOL TARTRATE 12.5 MG HALF TABLET: 12.5000 mg | ORAL_TABLET | Freq: Two times a day (BID) | ORAL | Status: DC

## 2020-10-09 MED ORDER — HEPARIN (PORCINE) 25000 UT/250ML-% IV SOLN
1400.0000 [IU]/h | INTRAVENOUS | Status: DC
  Administered 2020-10-10: 1400 [IU]/h via INTRAVENOUS
  Filled 2020-10-09: qty 250

## 2020-10-09 MED ORDER — MIDAZOLAM HCL 2 MG/2ML IJ SOLN
INTRAMUSCULAR | Status: DC | PRN
  Administered 2020-10-09 (×2): 1 mg via INTRAVENOUS

## 2020-10-09 MED ORDER — MIDAZOLAM HCL 2 MG/2ML IJ SOLN
INTRAMUSCULAR | Status: AC
  Filled 2020-10-09: qty 2

## 2020-10-09 SURGICAL SUPPLY — 18 items
CATH INFINITI 5FR JL5 (CATHETERS) ×2 IMPLANT
CATH INFINITI 5FR MULTPACK ANG (CATHETERS) ×2 IMPLANT
CATH SWAN GANZ 7F STRAIGHT (CATHETERS) ×2 IMPLANT
CLOSURE MYNX CONTROL 5F (Vascular Products) ×2 IMPLANT
GLIDESHEATH SLEND SS 6F .021 (SHEATH) ×2 IMPLANT
GUIDEWIRE .025 260CM (WIRE) ×2 IMPLANT
GUIDEWIRE INQWIRE 1.5J.035X260 (WIRE) ×1 IMPLANT
INQWIRE 1.5J .035X260CM (WIRE) ×2
KIT HEART LEFT (KITS) ×2 IMPLANT
PACK CARDIAC CATHETERIZATION (CUSTOM PROCEDURE TRAY) ×2 IMPLANT
SHEATH PINNACLE 5F 10CM (SHEATH) ×2 IMPLANT
SHEATH PINNACLE 7F 10CM (SHEATH) ×2 IMPLANT
SHEATH PROBE COVER 6X72 (BAG) ×2 IMPLANT
TRANSDUCER W/STOPCOCK (MISCELLANEOUS) ×2 IMPLANT
TUBING CIL FLEX 10 FLL-RA (TUBING) ×2 IMPLANT
WIRE EMERALD 3MM-J .035X150CM (WIRE) ×2 IMPLANT
WIRE EMERALD ST .035X150CM (WIRE) ×2 IMPLANT
WIRE HI TORQ VERSACORE-J 145CM (WIRE) ×2 IMPLANT

## 2020-10-09 NOTE — Progress Notes (Signed)
   10/09/20 1858  Assess: MEWS Score  Level of Consciousness Alert  Assess: MEWS Score  MEWS Temp 0  MEWS Systolic 0  MEWS Pulse 2  MEWS RR 0  MEWS LOC 0  MEWS Score 2  MEWS Score Color Yellow  Assess: if the MEWS score is Yellow or Red  Were vital signs taken at a resting state? Yes  Focused Assessment Change from prior assessment (see assessment flowsheet)  Early Detection of Sepsis Score *See Row Information* Low  MEWS guidelines implemented *See Row Information* Yes  Treat  MEWS Interventions Escalated (See documentation below)  Pain Scale 0-10  Pain Score 0  Take Vital Signs  Increase Vital Sign Frequency  Yellow: Q 2hr X 2 then Q 4hr X 2, if remains yellow, continue Q 4hrs  Escalate  MEWS: Escalate Yellow: discuss with charge nurse/RN and consider discussing with provider and RRT  Notify: Charge Nurse/RN  Name of Charge Nurse/RN Notified Lisette Abu, RN  Date Charge Nurse/RN Notified 10/09/20  Time Charge Nurse/RN Notified 1900  Document  Patient Outcome Other (Comment) (No intervention needed at this time)  Progress note created (see row info) Yes

## 2020-10-09 NOTE — Progress Notes (Signed)
Patient is non-compliant at this time with ordered bedrest.  Patient states that he will not lay down until he eats his food.  Patient was educated about the risk of bleeding. Patient states "I understand, but I am going to eat my food first".  MD notified.   Donah Driver, RN

## 2020-10-09 NOTE — Progress Notes (Addendum)
The patient has been seen in conjunction with Harlan Stains, NP. All aspects of care have been considered and discussed. The patient has been personally interviewed, examined, and all clinical data has been reviewed.   Eliquis is on hold. Last dose of Eliquis was yesterday morning prior to admission before leaving home.[name]  He has not received any since being in the hospital but could have potentially taken it yesterday morning.  Significant rise in troponin levels.  Left and right heart cath with coronary angiography recommended.  By today's echo was 40 to 45% which is not significantly different than 50% in January 2022.  Continue n.p.o.  The patient was counseled to undergo left heart catheterization, coronary angiography, and possible percutaneous coronary intervention with stent implantation. The procedural risks and benefits were discussed in detail. The risks discussed included death, stroke, myocardial infarction, life-threatening bleeding, limb ischemia, kidney injury, allergy, and possible emergency cardiac surgery. The risk of these significant complications were estimated to occur less than 1% of the time. After discussion, the patient has agreed to proceed.    Progress Note  Patient Name: [name] Date of Encounter: 10/09/2020  [facility] Cardiologist: No primary care provider on file.  Subjective   Doing well this morning. No complaints  Inpatient Medications    Scheduled Meds: . amiodarone  200 mg Oral Daily  . buprenorphine-naloxone  2 tablet Sublingual Daily  . roflumilast  500 mcg Oral Daily  . sodium chloride flush  3 mL Intravenous Q12H   Continuous Infusions: . sodium chloride    . sodium chloride 10 mL/hr at 10/09/20 0802  . heparin 1,200 Units/hr (10/09/20 0624)   PRN Meds: sodium chloride, acetaminophen **OR** acetaminophen, albuterol, sodium chloride flush   Vital Signs    Vitals:   10/09/20 0205 10/09/20 0300 10/09/20 0447  10/09/20 0857  BP: 114/78 116/80 125/74 134/64  Pulse: 87 67 99 95  Resp: (!) 22 19 20 20   Temp:   97.9 F (36.6 C) 97.8 F (36.6 C)  TempSrc:   Oral Oral  SpO2: 96% 95% 97% 95%  Weight:   95.1 kg   Height:        Intake/Output Summary (Last 24 hours) at 10/09/2020 1003 Last data filed at 10/09/2020 9983 Gross per 24 hour  Intake 111.66 ml  Output -  Net 111.66 ml   Last 3 Weights 10/09/2020 10/08/2020 07/16/2020  Weight (lbs) 209 lb 11.2 oz 215 lb 202 lb  Weight (kg) 95.119 kg 97.523 kg 91.627 kg      Telemetry    Afib rates 100-110s - Personally Reviewed  ECG    No new tracing this morning  Physical Exam   GEN: No acute distress.   Neck: No JVD Cardiac: Irreg Irreg tachy, no murmurs, rubs, or gallops.  Respiratory: Clear to auscultation bilaterally. GI: Soft, nontender, non-distended  MS: No edema; No deformity. Neuro:  Nonfocal  Psych: Normal affect   Labs    High Sensitivity Troponin:   Recent Labs  Lab 10/08/20 0825 10/08/20 1131 10/08/20 1644 10/08/20 2020 10/09/20 0810  TROPONINIHS 54* 1,637* 9,404* 11,331* 5,587*      Chemistry Recent Labs  Lab 10/08/20 0825 10/08/20 0853 10/08/20 0912 10/09/20 0301  NA 140 141 143 137  K 3.4* 3.3* 3.4* 3.8  CL 108  --  110 106  CO2 20*  --   --  23  GLUCOSE 170*  --  172* 114*  BUN 17  --  20 24*  CREATININE 1.44*  --  1.50* 1.32*  CALCIUM 8.4*  --   --  8.2*  PROT 7.3  --   --   --   ALBUMIN 3.1*  --   --   --   AST 18  --   --   --   ALT 13  --   --   --   ALKPHOS 93  --   --   --   BILITOT 0.7  --   --   --   GFRNONAA 51*  --   --  57*  ANIONGAP 12  --   --  8     Hematology Recent Labs  Lab 10/08/20 0825 10/08/20 0853 10/08/20 0912 10/08/20 2020 10/09/20 0810  WBC 11.3*  --   --  11.4* 10.3  RBC 5.53  --   --  5.03 4.99  HGB 17.5*   < > 18.0* 15.9 15.6  HCT 53.9*   < > 53.0* 47.7 47.2  MCV 97.5  --   --  94.8 94.6  MCH 31.6  --   --  31.6 31.3  MCHC 32.5  --   --  33.3 33.1  RDW  13.5  --   --  13.6 13.6  PLT 275  --   --  243 232   < > = values in this interval not displayed.    BNP Recent Labs  Lab 10/08/20 0825  BNP 250.2*     DDimer No results for input(s): DDIMER in the last 168 hours.   Radiology    DG Chest Port 1 View  Result Date: 10/08/2020 CLINICAL DATA:  Shortness of breath. EXAM: PORTABLE CHEST 1 VIEW COMPARISON:  CT 07/03/2020.  Chest x-ray 05/14/2019, 03/31/2019. FINDINGS: Mediastinum hilar structures normal. Cardiomegaly with mild pulmonary venous congestion bilateral interstitial prominence. Findings suggest mild CHF. Low lung volumes with bibasilar atelectasis. Stable bilateral mild pleural thickening. Costophrenic angles incompletely imaged. No prominent pleural effusion noted. No pneumothorax. Metallic densities noted over the left mandible. IMPRESSION: 1. Cardiomegaly with mild pulmonary venous congestion and bilateral interstitial prominence. Findings suggest mild CHF. 2.  Low lung volumes with bibasilar atelectasis. Electronically Signed   By: Marcello Moores  Register   On: 10/08/2020 08:50    Cardiac Studies   Echo: pending  Patient Profile     75 y.o. male with a PMH of chronic combined CHF/NICM, atrial flutter s/p ablation, paroxysmal atrial fibrillation on eliquis, HTN, COPD, polycythemia vera, CKD stage 3, polysubstance abuse (ETOH, heroin, cocaine) on suboxone, who was seen for evaluation of acute on chronic SOB and elevated HsTrop at the request of Dr. Dareen Piano.  Assessment & Plan    1. Elevated troponin in patient with coronary artery calcifications on CT: Patient presented with acute on chronic SOB and chest tightness. Hypoxic on EMS arrival improved with CPAP>BiPAP>3L O2 via [place]. EKG showed atrial fibrillation with RVR with rate 149bpm with STD in lateral leads. BNP elevated to 250 (improved from previous). -- HsTrop peaked at 11331 -- planned for [facility] today -- limited echo pending read  2. Paroxysmal atrial fibrillation: patient  found to be in atrial fibrillation with RVR on arrival in the ED. Rates up to 160s. He was started on an amiodarone gtt with improvement in HR to 80s-90s, then converted back to NSR.  -- back in Afib this morning with rates in the 100-110 range. Will resume IV amiodarone  -- Eliquis on hold, IV heparin with plans for cath -- consider EP eval  regarding ablation once coronary anatomy is defined  3. Chronic combined CHF: Hx of cardiomyopathy with EF as low as 35%. Felt to be NICM but no formal ischemic evaluation.  --Echo 07/2020 with EF ~52%. Limited echo pending read -- was hypotensive on admission but improved now -- will add Toprol 25mg  daily  4. COPD: SOB not clearly related to COPD.  -- Continue management per primary team   5. CKD stage 3: Cr 1.44>1.5>>1.32. Baseline appears to be 1.2-1.3.  -- follow BMET  6. Polysubstance abuse: reports ongoing abstinence  -- Continue to encourage avoidance.  For questions or updates, please contact [place] Please consult [email or web address] for contact info under        Signed, Reino Bellis, NP  10/09/2020, 10:03 AM

## 2020-10-09 NOTE — Progress Notes (Addendum)
ANTICOAGULATION CONSULT NOTE  Pharmacy Consult for heparin Indication: atrial fibrillation  No Known Allergies  Patient Measurements: Height: 5\' 6"  (167.6 cm) Weight: 95.1 kg (209 lb 11.2 oz) IBW/kg (Calculated) : 63.8 Heparin Dosing Weight: 85.1 kg  Vital Signs: Temp: 97.6 F (36.4 C) (03/23 1556) Temp Source: Oral (03/23 1556) BP: 121/78 (03/23 1746) Pulse Rate: 130 (03/23 1746)  Labs: Recent Labs    10/08/20 0825 10/08/20 0853 10/08/20 0912 10/08/20 1131 10/08/20 1644 10/08/20 2020 10/09/20 0301 10/09/20 0810 10/09/20 1734 10/09/20 1735  HGB 17.5*   < > 18.0*  --   --  15.9  --  15.6 14.6 14.3  HCT 53.9*   < > 53.0*  --   --  47.7  --  47.2 43.0 42.0  PLT 275  --   --   --   --  243  --  232  --   --   APTT  --   --   --   --   --   --   --  37*  --   --   LABPROT 16.6*  --   --   --   --   --   --   --   --   --   INR 1.4*  --   --   --   --   --   --   --   --   --   CREATININE 1.44*  --  1.50*  --   --   --  1.32*  --   --   --   TROPONINIHS 54*  --   --    < > 9,404* 11,331*  --  5,587*  --   --    < > = values in this interval not displayed.    Estimated Creatinine Clearance: 53.8 mL/min (A) (by C-G formula based on SCr of 1.32 mg/dL (H)).   Assessment: 77 YOM admitted with elevated troponins and afib with RVR on Eliquis PTA (LD 3/22 @1330 ). Pharmacy has been consulted to dose IV heparin.   He is s/p cath with severe stenosis prox to mid LAD but unable to PCI due to severe back pain. Plan to load with clopidogrel tonight and LAD PCI tomorrow. IV heparin to resume 8 hr post sheath removal per cath note. Sheath out 18:00. No bleeding noted.   Goal of Therapy:  Heparin level 0.3-0.7 units/ml aPTT 66-102 seconds Monitor platelets by anticoagulation protocol: Yes   Plan:  Resume heparin drip at 1400 units/hr with no bolus on 3/25 at 02:00 aPTT in 8 hours Daily aPTT, heparin level, CBC Monitor for s/sx of bleeding F/U after cath tomorrow  Thank you  for involving pharmacy in this patient's care.  Renold Genta, PharmD, BCPS Clinical Pharmacist Clinical phone for 10/09/2020 until 10p is [phone] 10/09/2020 6:30 PM  **Pharmacist phone directory can be found on [email or web address] listed under [place]**

## 2020-10-09 NOTE — Progress Notes (Signed)
Date: 10/09/2020  Patient name: Kilo Eshelman [facility]  Medical record number: [id number]  Date of birth: 07/05/1946   I have seen and evaluated [name] and discussed their care with the Residency Team.  In brief, patient is a 75 year old male with a past medical history of hypertension, persistent A. fib, polycythemia vera, COPD, CKD stage IIIa, nonischemic cardiomyopathy with an EF of 50%, OUD on Suboxone who presented to the ED with acute worsening of his shortness of breath x1 day.  Patient states that over the last couple of days he has not been feeling well and has had some intermittent chest pressure as well as worsening shortness of breath and dyspnea on exertion associated with productive cough with white phlegm.  Patient states that he woke up yesterday morning with acute worsening of his shortness of breath.  Patient states that he was having difficulty breathing and had associated diaphoresis.  He called 911 and EMS brought him to the ED for further evaluation.  When EMS initially evaluated him he was noted to be diaphoretic and in significant respiratory distress with O2 sats in the high 80s and low 90s and with an elevated SBP greater than 200 and heart rates in the 160s.  Patient denied any palpitations, lightheadedness or syncope.  No headache, no blurry vision, no focal weakness, no fevers or chills, no abdominal pain, no nausea or vomiting, no diarrhea.  Today, patient states that his shortness of breath has resolved and he feels much better.  PMHx, Fam Hx, and/or Soc Hx : As per resident admit note  Vitals:   10/09/20 0857 10/09/20 1100  BP: 134/64 104/78  Pulse: 95 100  Resp: 20 18  Temp: 97.8 F (36.6 C) (!) 97.5 F (36.4 C)  SpO2: 95% 95%   General: Awake, alert, oriented x3, NAD CVS: Tachycardic, irregularly irregular Lungs: Mildly decreased breath sounds bilateral bases but otherwise CTA bilaterally Abdomen: Soft, nontender, nondistended,  normoactive bowel sounds Extremities: No edema noted, nontender to palpation Psych: Normal affect HEENT: Normocephalic, atraumatic Skin: Warm and dry  Assessment and Plan: I have seen and evaluated the patient as outlined above. I agree with the formulated Assessment and Plan as detailed in the residents' note, with the following changes:   1.  Non-ST elevation MI possibly demand ischemia in the setting of A. fib with RVR: -Patient presented to the ED with acutely worsening shortness of breath and was found to be in A. fib with RVR with heart rates in the 150s to 160s and was noted to have an elevated troponin.  Patient's troponins have progressively increased up to 11,000 indicating cardiac injury.  EKG without significant ischemic changes consistent with a non-ST elevation MI.  Patient also noted to have pulmonary venous congestion on chest x-ray which is combination with his A. fib with RVR would explain his dyspnea.  Patient's Covid and flu panel were negative. -Troponins have now decreased to 5500.  No need to trend further troponins at this time -2D echo done today showed EF of 40 to 45% which is consistent with his prior echo (EF of 50%). -No evidence of an acute infectious process at this time even though he did have a mild leukocytosis of 11.4 on admission (likely reactive in the setting of acute shortness of breath).  His leukocytosis has resolved and he has not had any fevers -Patient likely with acute pulmonary edema likely secondary to A. fib with RVR.  His respiratory status is much improved and he is  O2 sats on room air are in the 90s.  We will hold off on Lasix at this time.   -Continue with strict I's and O's and daily weights -Cardiology follow-up recommendations appreciated.  Patient to undergo left and right heart catheter today to rule out ischemic etiology of his symptoms. -Continue with heparin drip for now for possible ACS as well as A. fib.  We will continue to hold Eliquis  at this time. -Patient heart rates remain elevated in the 100.  We will continue with metoprolol 25 mg daily for now.  Would consider titrating his dose up to 50 mg if his heart rate remains elevated -Patient's creatinine is at his baseline today.  We will continue to monitor his BMP -No further work-up for now.  We will continue to monitor closely.  Aldine Contes, MD 3/23/20222:55 PM

## 2020-10-09 NOTE — Interval H&P Note (Signed)
History and Physical Interval Note:  10/09/2020 4:43 PM  [name]  has presented today for surgery, with the diagnosis of NSTEMI.  The various methods of treatment have been discussed with the patient and family. After consideration of risks, benefits and other options for treatment, the patient has consented to  Procedure(s): RIGHT/LEFT HEART CATH AND CORONARY ANGIOGRAPHY (N/A) as a surgical intervention.  The patient's history has been reviewed, patient examined, no change in status, stable for surgery.  I have reviewed the patient's chart and labs.  Questions were answered to the patient's satisfaction.    Cath Lab Visit (complete for each Cath Lab visit)  Clinical Evaluation Leading to the Procedure:   ACS: Yes.    Non-ACS:    Anginal Classification: CCS II  Anti-ischemic medical therapy: No Therapy  Non-Invasive Test Results: No non-invasive testing performed  Prior CABG: No previous CABG        [name]

## 2020-10-09 NOTE — Progress Notes (Addendum)
  Echocardiogram 2D Echocardiogram with 3D has been performed. Unable to use definity, both IV's are infiltrated. Nurse placed a consult for IV team.   [name] 10/09/2020, 9:25 AM

## 2020-10-09 NOTE — Progress Notes (Signed)
ANTICOAGULATION CONSULT NOTE  Pharmacy Consult for heparin Indication: atrial fibrillation  No Known Allergies  Patient Measurements: Height: 5\' 6"  (167.6 cm) Weight: 95.1 kg (209 lb 11.2 oz) IBW/kg (Calculated) : 63.8 Heparin Dosing Weight: 85.1 kg  Vital Signs: Temp: 97.8 F (36.6 C) (03/23 0857) Temp Source: Oral (03/23 0857) BP: 134/64 (03/23 0857) Pulse Rate: 95 (03/23 0857)  Labs: Recent Labs    10/08/20 0825 10/08/20 0853 10/08/20 0912 10/08/20 1131 10/08/20 1644 10/08/20 2020 10/09/20 0301 10/09/20 0810  HGB 17.5*   < > 18.0*  --   --  15.9  --  15.6  HCT 53.9*   < > 53.0*  --   --  47.7  --  47.2  PLT 275  --   --   --   --  243  --  232  APTT  --   --   --   --   --   --   --  37*  LABPROT 16.6*  --   --   --   --   --   --   --   INR 1.4*  --   --   --   --   --   --   --   CREATININE 1.44*  --  1.50*  --   --   --  1.32*  --   TROPONINIHS 54*  --   --    < > 9,404* 11,331*  --  5,587*   < > = values in this interval not displayed.    Estimated Creatinine Clearance: 53.8 mL/min (A) (by C-G formula based on SCr of 1.32 mg/dL (H)).   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 Pharmacy has been consulted to dose heparin. Planning cath 3/24.  -aPTT= 37  Goal of Therapy:  Heparin level 0.3-0.7 units/ml aPTT 66-102 seconds Monitor platelets by anticoagulation protocol: Yes   Plan:  Increase heparin to 1400 units/hr aPTT in 8 hours Daily aPTT, heparin level, CBC  Hildred Laser, PharmD Clinical Pharmacist **Pharmacist phone directory can now be found on [email or web address] ([email or web address]).  Listed under [place].

## 2020-10-09 NOTE — Progress Notes (Signed)
Subjective:   [name] says he feels much better today, without CP or pressure currently and says his breathing has significantly improved. He clarifies that his SOB is only with exertion, not when laying down at night. He says his cough is not worse than his chronic cough. Says his cough is not productive. Denies any lightheadedness, dizziness, swelling, or troubles urinating. Says he has never had a heart attack. Takes his Eliquis twice daily at home without missing doses, continues to take Suboxone and roflumilast at home regularly. Feels ready for cath today.  Objective:  Vital signs in last 24 hours: Vitals:   10/09/20 0030 10/09/20 0205 10/09/20 0300 10/09/20 0447  BP: (!) 116/97 114/78 116/80 125/74  Pulse: 94 87 67 99  Resp: (!) 22 (!) 22 19 20   Temp:    97.9 F (36.6 C)  TempSrc:    Oral  SpO2: 97% 96% 95% 97%  Weight:    95.1 kg  Height:       General: Patient is obese. No acute distress. HENT: MMM. No nasal discharge. Respiratory: There are mild crackles in the right lung base, although lungs are otherwise CTA, bilaterally without wheezing or rhonchi. No increased work of breathing. Cardiovascular: Rate is tachycardic in the 1-teens. Rhythm is irregularly irregular. No murmurs, rubs, or gallops. No lower extremity edema. Abdominal: Soft and non-tender to palpation. Bowel sounds intact. No rebound or guarding. Skin: Warm and dry. No rashes or lesions. Psych: Normal affect. Normal tone of voice.   Assessment/Plan:  Active Problems:   Atrial fibrillation with RVR (HCC)  Severe Stenosis Proximal to mid LAD  EKG showed ST depressions in the lateral leads. Troponin's peaked today at 11,331. He has a known history of CAD per CT scan. R/LHC today showed proximal LAD lesion with 95% stenosis and elevated R and L heart pressures, although no obstructive disease in the RCA or Cx artery. Cardiology were unable to proceed with PCI tonight given he was in severe back pain  without improvement with sedation.  - Cardiology following, appreciate their recommendation. - Plan for LAD PCI tomorrow  - Load with Plavix 600mg  tonight  - Heparin drip to resume 8 hours post sheath pull  - Continue Plavix daily  - Hold ASA until after catheterization   Persistent A-Fib with RVR Patient denies missing any Eliquis dosing at home. He converted to NSR on amiodarone gtt initially, although was found to be in A-Fib with rates in the 1-teens this morning and elevated to 140bpm during his catheterization today. RVR is likely in the setting of acute ischemia vs. Underlying pulmonary edema/CHF. - Cardiology following, appreciate their recommendations  - Heparin drip to resume 8 hours post sheath pull  - Increase Toprol-XL to 25mg  daily  - Continue amiodarone gtt  - Holding home Eliquis   Acute on Chronic Combined CHF Patient has hx of CM with EF as low as 35%, felt to be NICM although he had never had a formal ischemic evaluation. His most recent ECHO 08/15/20 showed an EF of 52% and an ascending aortic aneurysm, measuring 70mm. Limited ECHO here again showed reduced EF of 40-45% with global hypokinesis, mildly elevated PA systolic pressures, Mod-severe MR (likely functional), mild-mod TR, and Moderate AR with dilated IVC and aortic aneurysm of 58mm. Acute reduction in EF is likely 2/2 ischemia and A-Fib with RVR. CXR did show pulmonary edema with R > L infiltrates. SOB has improved significantly with diuresis.  - Cardiology following, appreciate their recommendations  -  Will get 1 dose of IV Lasix tonight - Strict I&O - Daily weights   Hypertension: Blood pressures are not as soft as during admission. Remain stable.  - Continue Toprol-XL 25mg  daily   CKD 3 Hypokalemia, Resolved Creatinine improved from admission ~1.5 to baseline ~1.3. GFR 57. Patient appears to be at his baseline renal function with normalization of potassium.  - Daily BMP  Polycythemia vera Patient's  initial elevated hemoglobin normalized this morning.  - Daily CBC   Ascending aortic aneurysm Continues to have aneurysm on limited ECHO although stable.  - Will need outpatient CT follow up  OUD - Continue home suboxone 8-2 mg 2 films daily  Prior to Admission Living Arrangement: Home alone Anticipated Discharge Location: Pending PT/OT evaluation after PCI Barriers to Discharge: Continued medical evaluation  Jeralyn Bennett, MD 10/09/2020, 7:47 AM Pager: [phone] After 5pm on weekdays and 1pm on weekends: On Call pager [phone]

## 2020-10-09 NOTE — H&P (View-Only) (Signed)
The patient has been seen in conjunction with Harlan Stains, NP. All aspects of care have been considered and discussed. The patient has been personally interviewed, examined, and all clinical data has been reviewed.   Eliquis is on hold. Last dose of Eliquis was yesterday morning prior to admission before leaving home.[name]  He has not received any since being in the hospital but could have potentially taken it yesterday morning.  Significant rise in troponin levels.  Left and right heart cath with coronary angiography recommended.  By today's echo was 40 to 45% which is not significantly different than 50% in January 2022.  Continue n.p.o.  The patient was counseled to undergo left heart catheterization, coronary angiography, and possible percutaneous coronary intervention with stent implantation. The procedural risks and benefits were discussed in detail. The risks discussed included death, stroke, myocardial infarction, life-threatening bleeding, limb ischemia, kidney injury, allergy, and possible emergency cardiac surgery. The risk of these significant complications were estimated to occur less than 1% of the time. After discussion, the patient has agreed to proceed.    Progress Note  Patient Name: [name] Date of Encounter: 10/09/2020  [facility] Cardiologist: No primary care provider on file.  Subjective   Doing well this morning. No complaints  Inpatient Medications    Scheduled Meds: . amiodarone  200 mg Oral Daily  . buprenorphine-naloxone  2 tablet Sublingual Daily  . roflumilast  500 mcg Oral Daily  . sodium chloride flush  3 mL Intravenous Q12H   Continuous Infusions: . sodium chloride    . sodium chloride 10 mL/hr at 10/09/20 0802  . heparin 1,200 Units/hr (10/09/20 0624)   PRN Meds: sodium chloride, acetaminophen **OR** acetaminophen, albuterol, sodium chloride flush   Vital Signs    Vitals:   10/09/20 0205 10/09/20 0300 10/09/20 0447  10/09/20 0857  BP: 114/78 116/80 125/74 134/64  Pulse: 87 67 99 95  Resp: (!) 22 19 20 20   Temp:   97.9 F (36.6 C) 97.8 F (36.6 C)  TempSrc:   Oral Oral  SpO2: 96% 95% 97% 95%  Weight:   95.1 kg   Height:        Intake/Output Summary (Last 24 hours) at 10/09/2020 1003 Last data filed at 10/09/2020 1027 Gross per 24 hour  Intake 111.66 ml  Output --  Net 111.66 ml   Last 3 Weights 10/09/2020 10/08/2020 07/16/2020  Weight (lbs) 209 lb 11.2 oz 215 lb 202 lb  Weight (kg) 95.119 kg 97.523 kg 91.627 kg      Telemetry    Afib rates 100-110s - Personally Reviewed  ECG    No new tracing this morning  Physical Exam   GEN: No acute distress.   Neck: No JVD Cardiac: Irreg Irreg tachy, no murmurs, rubs, or gallops.  Respiratory: Clear to auscultation bilaterally. GI: Soft, nontender, non-distended  MS: No edema; No deformity. Neuro:  Nonfocal  Psych: Normal affect   Labs    High Sensitivity Troponin:   Recent Labs  Lab 10/08/20 0825 10/08/20 1131 10/08/20 1644 10/08/20 2020 10/09/20 0810  TROPONINIHS 54* 1,637* 9,404* 11,331* 5,587*      Chemistry Recent Labs  Lab 10/08/20 0825 10/08/20 0853 10/08/20 0912 10/09/20 0301  NA 140 141 143 137  K 3.4* 3.3* 3.4* 3.8  CL 108  --  110 106  CO2 20*  --   --  23  GLUCOSE 170*  --  172* 114*  BUN 17  --  20 24*  CREATININE 1.44*  --  1.50* 1.32*  CALCIUM 8.4*  --   --  8.2*  PROT 7.3  --   --   --   ALBUMIN 3.1*  --   --   --   AST 18  --   --   --   ALT 13  --   --   --   ALKPHOS 93  --   --   --   BILITOT 0.7  --   --   --   GFRNONAA 51*  --   --  57*  ANIONGAP 12  --   --  8     Hematology Recent Labs  Lab 10/08/20 0825 10/08/20 0853 10/08/20 0912 10/08/20 2020 10/09/20 0810  WBC 11.3*  --   --  11.4* 10.3  RBC 5.53  --   --  5.03 4.99  HGB 17.5*   < > 18.0* 15.9 15.6  HCT 53.9*   < > 53.0* 47.7 47.2  MCV 97.5  --   --  94.8 94.6  MCH 31.6  --   --  31.6 31.3  MCHC 32.5  --   --  33.3 33.1   RDW 13.5  --   --  13.6 13.6  PLT 275  --   --  243 232   < > = values in this interval not displayed.    BNP Recent Labs  Lab 10/08/20 0825  BNP 250.2*     DDimer No results for input(s): DDIMER in the last 168 hours.   Radiology    DG Chest Port 1 View  Result Date: 10/08/2020 CLINICAL DATA:  Shortness of breath. EXAM: PORTABLE CHEST 1 VIEW COMPARISON:  CT 07/03/2020.  Chest x-ray 05/14/2019, 03/31/2019. FINDINGS: Mediastinum hilar structures normal. Cardiomegaly with mild pulmonary venous congestion bilateral interstitial prominence. Findings suggest mild CHF. Low lung volumes with bibasilar atelectasis. Stable bilateral mild pleural thickening. Costophrenic angles incompletely imaged. No prominent pleural effusion noted. No pneumothorax. Metallic densities noted over the left mandible. IMPRESSION: 1. Cardiomegaly with mild pulmonary venous congestion and bilateral interstitial prominence. Findings suggest mild CHF. 2.  Low lung volumes with bibasilar atelectasis. Electronically Signed   By: Marcello Moores  Register   On: 10/08/2020 08:50    Cardiac Studies   Echo: pending  Patient Profile     75 y.o. male with a PMH of chronic combined CHF/NICM, atrial flutter s/p ablation, paroxysmal atrial fibrillation on eliquis, HTN, COPD, polycythemia vera, CKD stage 3, polysubstance abuse (ETOH, heroin, cocaine) on suboxone, who was seen for evaluation of acute on chronic SOB and elevated HsTrop at the request of Dr. Dareen Piano.  Assessment & Plan    1. Elevated troponin in patient with coronary artery calcifications on CT: Patient presented with acute on chronic SOB and chest tightness. Hypoxic on EMS arrival improved with CPAP>BiPAP>3L O2 via [place]. EKG showed atrial fibrillation with RVR with rate 149bpm with STD in lateral leads. BNP elevated to 250 (improved from previous). -- HsTrop peaked at 11331 -- planned for [facility] today -- limited echo pending read  2. Paroxysmal atrial fibrillation:  patient found to be in atrial fibrillation with RVR on arrival in the ED. Rates up to 160s. He was started on an amiodarone gtt with improvement in HR to 80s-90s, then converted back to NSR.  -- back in Afib this morning with rates in the 100-110 range. Will resume IV amiodarone  -- Eliquis on hold, IV heparin with plans for cath -- consider EP eval  regarding ablation once coronary anatomy is defined  3. Chronic combined CHF: Hx of cardiomyopathy with EF as low as 35%. Felt to be NICM but no formal ischemic evaluation.  --Echo 07/2020 with EF ~52%. Limited echo pending read -- was hypotensive on admission but improved now -- will add Toprol 25mg  daily  4. COPD: SOB not clearly related to COPD.  -- Continue management per primary team   5. CKD stage 3: Cr 1.44>1.5>>1.32. Baseline appears to be 1.2-1.3.  -- follow BMET  6. Polysubstance abuse: reports ongoing abstinence  -- Continue to encourage avoidance.  For questions or updates, please contact [place] Please consult [email or web address] for contact info under        Signed, Reino Bellis, NP  10/09/2020, 10:03 AM

## 2020-10-09 NOTE — Progress Notes (Signed)
Paged for continued back pain after cath. Patient states that he started having pain during cath. States pain worsening as he had to lay still during procedure. States pain is across bilateral lower back and now having some upper back pain. States he intermittently get back pain, states this was more of an issue prior to starting suboxone. Denies any leg pain, numbness, tingling, weakness, dizziness or lightheadedness.   Blood pressure (!) 139/98, pulse (!) 139, temperature 98.1 F (36.7 C), temperature source Oral, resp. rate 16, height 5\' 6"  (1.676 m), weight 95.1 kg, SpO2 93 %.  Constitution: restless in bed, in no acute distress Pulmonary: CTA, no increased work of breathing Cardiac: tachycardia, no murmurs Neuro: Alert and oriented x4, CN intact, strength intact, sensation grossly intact Skin: Cath site at right inguinal area clean and dressed, no hematoma, no bleeding  A/P: Patient with history of back pain with worsening back pain after cath this afternoon. Likely MSK due to having to lay still for the procedure. Will try on muscle relaxer for this and continue to monitor.

## 2020-10-09 NOTE — ED Notes (Signed)
Per CN on 6E she has not approved the room at this time.

## 2020-10-10 ENCOUNTER — Encounter (HOSPITAL_COMMUNITY): Payer: Self-pay | Admitting: Cardiovascular Disease

## 2020-10-10 ENCOUNTER — Inpatient Hospital Stay (HOSPITAL_COMMUNITY)
Admission: EM | Disposition: A | Discharge status: Home / Self Care | Payer: Self-pay | Source: Home / Self Care | Attending: Internal Medicine

## 2020-10-10 DIAGNOSIS — I25110 Atherosclerotic heart disease of native coronary artery with unstable angina pectoris: Secondary | ICD-10-CM

## 2020-10-10 HISTORY — PX: CORONARY STENT INTERVENTION: CATH118234

## 2020-10-10 LAB — POCT ACTIVATED CLOTTING TIME
Activated Clotting Time: 440 seconds
Activated Clotting Time: 285 seconds
Activated Clotting Time: 220 seconds
Activated Clotting Time: 166 seconds

## 2020-10-10 LAB — CBC
HCT: 47.5 % (ref 39.0–52.0)
Hemoglobin: 15.4 g/dL (ref 13.0–17.0)
MCH: 31 pg (ref 26.0–34.0)
MCHC: 32.4 g/dL (ref 30.0–36.0)
MCV: 95.6 fL (ref 80.0–100.0)
Platelets: 225 10*3/uL (ref 150–400)
RBC: 4.97 MIL/uL (ref 4.22–5.81)
RDW: 13.6 % (ref 11.5–15.5)
WBC: 11.5 10*3/uL — ABNORMAL HIGH (ref 4.0–10.5)
nRBC: 0 % (ref 0.0–0.2)

## 2020-10-10 LAB — COMPREHENSIVE METABOLIC PANEL
ALT: 15 U/L (ref 0–44)
AST: 31 U/L (ref 15–41)
Albumin: 3.1 g/dL — ABNORMAL LOW (ref 3.5–5.0)
Alkaline Phosphatase: 77 U/L (ref 38–126)
Anion gap: 8 (ref 5–15)
BUN: 19 mg/dL (ref 8–23)
CO2: 23 mmol/L (ref 22–32)
Calcium: 8.3 mg/dL — ABNORMAL LOW (ref 8.9–10.3)
Chloride: 106 mmol/L (ref 98–111)
Creatinine, Ser: 1.42 mg/dL — ABNORMAL HIGH (ref 0.61–1.24)
GFR, Estimated: 52 mL/min — ABNORMAL LOW (ref >60)
Glucose, Bld: 88 mg/dL (ref 70–99)
Potassium: 4 mmol/L (ref 3.5–5.1)
Sodium: 137 mmol/L (ref 135–145)
Total Bilirubin: 0.8 mg/dL (ref 0.3–1.2)
Total Protein: 7.2 g/dL (ref 6.5–8.1)

## 2020-10-10 LAB — APTT: aPTT: 72 seconds — ABNORMAL HIGH (ref 24–36)

## 2020-10-10 LAB — HEPARIN LEVEL (UNFRACTIONATED): Heparin Unfractionated: 0.5 IU/mL (ref 0.30–0.70)

## 2020-10-10 LAB — MAGNESIUM: Magnesium: 1.9 mg/dL (ref 1.7–2.4)

## 2020-10-10 LAB — MRSA PCR SCREENING: MRSA by PCR: NEGATIVE

## 2020-10-10 SURGERY — CORONARY STENT INTERVENTION
Anesthesia: LOCAL

## 2020-10-10 MED ORDER — SODIUM CHLORIDE 0.9% FLUSH
3.0000 mL | Freq: Two times a day (BID) | INTRAVENOUS | Status: DC
  Administered 2020-10-11 – 2020-10-13 (×5): 3 mL via INTRAVENOUS

## 2020-10-10 MED ORDER — LIDOCAINE 5 % EX PTCH
1.0000 | MEDICATED_PATCH | CUTANEOUS | Status: DC
  Administered 2020-10-10 – 2020-10-12 (×3): 1 via TRANSDERMAL
  Filled 2020-10-10 (×3): qty 1

## 2020-10-10 MED ORDER — FENTANYL CITRATE (PF) 100 MCG/2ML IJ SOLN
INTRAMUSCULAR | Status: DC | PRN
  Administered 2020-10-10: 25 ug via INTRAVENOUS
  Administered 2020-10-10: 50 ug via INTRAVENOUS
  Administered 2020-10-10: 25 ug via INTRAVENOUS

## 2020-10-10 MED ORDER — ASPIRIN 81 MG PO CHEW
81.0000 mg | CHEWABLE_TABLET | Freq: Every day | ORAL | Status: DC
  Administered 2020-10-11 – 2020-10-13 (×3): 81 mg via ORAL
  Filled 2020-10-10 (×3): qty 1

## 2020-10-10 MED ORDER — HEPARIN SODIUM (PORCINE) 1000 UNIT/ML IJ SOLN
INTRAMUSCULAR | Status: AC
  Filled 2020-10-10: qty 1

## 2020-10-10 MED ORDER — RAMELTEON 8 MG PO TABS
8.0000 mg | ORAL_TABLET | Freq: Every day | ORAL | Status: DC
  Administered 2020-10-10 – 2020-10-12 (×4): 8 mg via ORAL
  Filled 2020-10-10 (×6): qty 1

## 2020-10-10 MED ORDER — ATROPINE SULFATE 1 MG/10ML IJ SOSY
PREFILLED_SYRINGE | INTRAMUSCULAR | Status: AC
  Filled 2020-10-10: qty 10

## 2020-10-10 MED ORDER — CLOPIDOGREL BISULFATE 75 MG PO TABS
75.0000 mg | ORAL_TABLET | Freq: Every day | ORAL | Status: DC
  Administered 2020-10-11 – 2020-10-13 (×3): 75 mg via ORAL
  Filled 2020-10-10 (×3): qty 1

## 2020-10-10 MED ORDER — MORPHINE SULFATE (PF) 2 MG/ML IV SOLN: 2.0000 mg | INTRAVENOUS | Status: DC | PRN

## 2020-10-10 MED ORDER — CHLORHEXIDINE GLUCONATE CLOTH 2 % EX PADS: 6.0000 | MEDICATED_PAD | Freq: Every day | CUTANEOUS | Status: DC

## 2020-10-10 MED ORDER — MIDAZOLAM HCL 2 MG/2ML IJ SOLN
INTRAMUSCULAR | Status: DC | PRN
  Administered 2020-10-10 (×2): 1 mg via INTRAVENOUS

## 2020-10-10 MED ORDER — ATORVASTATIN CALCIUM 80 MG PO TABS
80.0000 mg | ORAL_TABLET | Freq: Every day | ORAL | Status: DC
  Administered 2020-10-10: 80 mg via ORAL
  Filled 2020-10-10: qty 1

## 2020-10-10 MED ORDER — LABETALOL HCL 5 MG/ML IV SOLN: 10.0000 mg | INTRAVENOUS | Status: AC | PRN

## 2020-10-10 MED ORDER — FUROSEMIDE 40 MG PO TABS
40.0000 mg | ORAL_TABLET | Freq: Two times a day (BID) | ORAL | Status: DC
  Administered 2020-10-10 – 2020-10-11 (×2): 40 mg via ORAL
  Filled 2020-10-10 (×2): qty 1

## 2020-10-10 MED ORDER — IOHEXOL 350 MG/ML SOLN
INTRAVENOUS | Status: DC | PRN
  Administered 2020-10-10: 100 mL via INTRA_ARTERIAL

## 2020-10-10 MED ORDER — FENTANYL CITRATE (PF) 100 MCG/2ML IJ SOLN
INTRAMUSCULAR | Status: AC
  Filled 2020-10-10: qty 2

## 2020-10-10 MED ORDER — HYDRALAZINE HCL 20 MG/ML IJ SOLN: 10.0000 mg | INTRAMUSCULAR | Status: AC | PRN

## 2020-10-10 MED ORDER — NITROGLYCERIN 1 MG/10 ML FOR IR/CATH LAB
INTRA_ARTERIAL | Status: AC
  Filled 2020-10-10: qty 10

## 2020-10-10 MED ORDER — SODIUM CHLORIDE 0.9% FLUSH: 3.0000 mL | INTRAVENOUS | Status: DC | PRN

## 2020-10-10 MED ORDER — APIXABAN 5 MG PO TABS
5.0000 mg | ORAL_TABLET | Freq: Two times a day (BID) | ORAL | Status: DC
  Administered 2020-10-11 – 2020-10-13 (×5): 5 mg via ORAL
  Filled 2020-10-10 (×5): qty 1

## 2020-10-10 MED ORDER — ATORVASTATIN CALCIUM 80 MG PO TABS
80.0000 mg | ORAL_TABLET | Freq: Every day | ORAL | Status: DC
  Administered 2020-10-11 – 2020-10-13 (×3): 80 mg via ORAL
  Filled 2020-10-10 (×3): qty 1

## 2020-10-10 MED ORDER — HYDROMORPHONE HCL 1 MG/ML IJ SOLN
0.5000 mg | INTRAMUSCULAR | Status: DC | PRN
Start: 2020-10-10 — End: 2020-10-10

## 2020-10-10 MED ORDER — HEPARIN SODIUM (PORCINE) 1000 UNIT/ML IJ SOLN
INTRAMUSCULAR | Status: DC | PRN
  Administered 2020-10-10: 10000 [IU] via INTRAVENOUS

## 2020-10-10 MED ORDER — ACETAMINOPHEN 325 MG PO TABS: 650.0000 mg | ORAL_TABLET | ORAL | Status: DC | PRN

## 2020-10-10 MED ORDER — SODIUM CHLORIDE 0.9 % IV SOLN: INTRAVENOUS | Status: AC

## 2020-10-10 MED ORDER — MIDAZOLAM HCL 2 MG/2ML IJ SOLN
INTRAMUSCULAR | Status: AC
  Filled 2020-10-10: qty 2

## 2020-10-10 MED ORDER — HEPARIN (PORCINE) IN NACL 1000-0.9 UT/500ML-% IV SOLN
INTRAVENOUS | Status: DC | PRN
Start: 2020-10-10 — End: 2020-10-10
  Administered 2020-10-10: 500 mL

## 2020-10-10 MED ORDER — NITROGLYCERIN 0.4 MG SL SUBL: 0.4000 mg | SUBLINGUAL_TABLET | SUBLINGUAL | Status: DC | PRN

## 2020-10-10 MED ORDER — LIDOCAINE HCL (PF) 1 % IJ SOLN
INTRAMUSCULAR | Status: DC | PRN
  Administered 2020-10-10: 25 mL

## 2020-10-10 MED ORDER — SODIUM CHLORIDE 0.9 % IV SOLN: 250.0000 mL | INTRAVENOUS | Status: DC | PRN

## 2020-10-10 MED ORDER — HEPARIN (PORCINE) IN NACL 1000-0.9 UT/500ML-% IV SOLN
INTRAVENOUS | Status: DC | PRN
  Administered 2020-10-10: 500 mL

## 2020-10-10 MED ORDER — AMIODARONE HCL 100 MG PO TABS
100.0000 mg | ORAL_TABLET | Freq: Every day | ORAL | Status: DC
  Administered 2020-10-10 – 2020-10-11 (×2): 100 mg via ORAL
  Filled 2020-10-10 (×2): qty 1

## 2020-10-10 SURGICAL SUPPLY — 22 items
BALLN SAPPHIRE 2.0X12 (BALLOONS) ×2
BALLN ~~LOC~~ EMERGE MR 4.0X15 (BALLOONS) ×2
BALLOON SAPPHIRE 2.0X12 (BALLOONS) IMPLANT
BALLOON ~~LOC~~ EMERGE MR 4.0X15 (BALLOONS) IMPLANT
CATH INFINITI JR4 5F (CATHETERS) ×1 IMPLANT
CATH QUICKCROSS SUPP .035X90CM (MICROCATHETER) ×1 IMPLANT
CATH VISTA GUIDE 6FR XBLAD4 (CATHETERS) ×1 IMPLANT
ELECT DEFIB PAD ADLT CADENCE (PAD) ×1 IMPLANT
GUIDEWIRE SAFE TJ AMPLATZ EXST (WIRE) ×1 IMPLANT
KIT ENCORE 40 (KITS) ×1 IMPLANT
KIT HEART LEFT (KITS) ×2 IMPLANT
PACK CARDIAC CATHETERIZATION (CUSTOM PROCEDURE TRAY) ×2 IMPLANT
PINNACLE LONG 6F 25CM (SHEATH) ×2
SHEATH INTRO PINNACLE 6F 25CM (SHEATH) IMPLANT
SHEATH PINNACLE 6F 10CM (SHEATH) IMPLANT
SHEATH PROBE COVER 6X72 (BAG) ×1 IMPLANT
STENT RESOLUTE ONYX 3.5X22 (Permanent Stent) ×1 IMPLANT
TRANSDUCER W/STOPCOCK (MISCELLANEOUS) ×2 IMPLANT
TUBING CIL FLEX 10 FLL-RA (TUBING) ×2 IMPLANT
WIRE ASAHI PROWATER 180CM (WIRE) ×1 IMPLANT
WIRE EMERALD 3MM-J .035X150CM (WIRE) ×1 IMPLANT
WIRE HI TORQ VERSACORE 300 (WIRE) ×1 IMPLANT

## 2020-10-10 NOTE — Progress Notes (Incomplete)
Heart Failure Stewardship Pharmacist Progress Note   PCP: Marianna Payment, MD PCP-Cardiologist: Sinclair Grooms, MD    HPI:  75 yo M with PMH of CHF, atrial flutter/fibrillation, HTN, COPD, polycythemia vera, CKD III, polysubstance abuse, and ascending aortic aneurysm. He presented to the ED on 10/08/20 with   Current HF Medications: ***  Prior to admission HF Medications: ***  Pertinent Lab Values: . Serum creatinine ***, BUN ***, Potassium ***, Sodium ***, BNP ***, Magnesium ***, Digoxin ***   Vital Signs: . Weight: *** lbs (admission weight: *** lbs) . Blood pressure: ***  . Heart rate: ***  . ReDS reading: ***  Medication Assistance / Insurance Benefits Check: Does the patient have prescription insurance?  {Yes/No/Pending:24180} Type of insurance plan: ***  Does the patient qualify for medication assistance through manufacturers or grants?   {Yes/No/Pending:24180} . Eligible grants and/or patient assistance programs: *** . Medication assistance applications in progress: ***  . Medication assistance applications approved: *** Approved medication assistance renewals will be completed by: ***  Outpatient Pharmacy:  Prior to admission outpatient pharmacy: *** Is the patient willing to use [facility] pharmacy at discharge? {Yes/No/Pending:24180} Is the patient willing to transition their outpatient pharmacy to utilize a [facility] outpatient pharmacy?   {Yes/No/Pending:24180}    Assessment: 1. ***Acute on chronic systolic CHF (EF ***), due to ***. NYHA class *** symptoms. -    Plan: 1) Medication changes recommended at this time: -  2) Patient assistance: -  3)  Education  - To be completed prior to discharge  Kerby Nora, PharmD, BCPS Heart Failure Stewardship Pharmacist Phone [phone]

## 2020-10-10 NOTE — Interval H&P Note (Signed)
Cath Lab Visit (complete for each Cath Lab visit)  Clinical Evaluation Leading to the Procedure:   ACS: Yes.    Non-ACS:    Anginal Classification: CCS II  Anti-ischemic medical therapy: Minimal Therapy (1 class of medications)  Non-Invasive Test Results: No non-invasive testing performed  Prior CABG: No previous CABG      History and Physical Interval Note:  10/10/2020 2:25 PM  [name]  has presented today for surgery, with the diagnosis of cad.  The various methods of treatment have been discussed with the patient and family. After consideration of risks, benefits and other options for treatment, the patient has consented to  Procedure(s): CORONARY STENT INTERVENTION (N/A) as a surgical intervention.  The patient's history has been reviewed, patient examined, no change in status, stable for surgery.  I have reviewed the patient's chart and labs.  Questions were answered to the patient's satisfaction.     [name]

## 2020-10-10 NOTE — Progress Notes (Signed)
ANTICOAGULATION CONSULT NOTE  Pharmacy Consult for heparin Indication: atrial fibrillation  No Known Allergies  Patient Measurements: Height: 5\' 6"  (167.6 cm) Weight: 93.6 kg (206 lb 6.4 oz) IBW/kg (Calculated) : 63.8 Heparin Dosing Weight: 85.1 kg  Vital Signs: Temp: 97.4 F (36.3 C) (03/24 1100) Temp Source: Oral (03/24 1100) BP: 112/66 (03/24 1100) Pulse Rate: 86 (03/24 1100)  Labs: Recent Labs    10/08/20 0825 10/08/20 0853 10/08/20 0912 10/08/20 1131 10/08/20 1644 10/08/20 2020 10/09/20 0301 10/09/20 0810 10/09/20 1734 10/09/20 1735 10/10/20 0300 10/10/20 1006  HGB 17.5*   < > 18.0*  --   --  15.9  --  15.6 14.6 14.3 15.4  --   HCT 53.9*   < > 53.0*  --   --  47.7  --  47.2 43.0 42.0 47.5  --   PLT 275  --   --   --   --  243  --  232  --   --  225  --   APTT  --   --   --   --   --   --   --  37*  --   --   --  72*  LABPROT 16.6*  --   --   --   --   --   --   --   --   --   --   --   INR 1.4*  --   --   --   --   --   --   --   --   --   --   --   HEPARINUNFRC  --   --   --   --   --   --   --   --   --   --   --  0.50  CREATININE 1.44*  --  1.50*  --   --   --  1.32*  --   --   --  1.42*  --   TROPONINIHS 54*  --   --    < > 9,404* 11,331*  --  5,587*  --   --   --   --    < > = values in this interval not displayed.    Estimated Creatinine Clearance: 49.6 mL/min (A) (by C-G formula based on SCr of 1.42 mg/dL (H)).   Assessment: 84 YOM admitted with elevated troponins and afib with RVR on Eliquis PTA (LD 3/22 @1330 ). Pharmacy has been consulted to dose IV heparin.   He is s/p cath with severe stenosis prox to mid LAD but unable to PCI due to severe back pain. Plan for PCI to LAD today -aPTT at goal  Goal of Therapy:  Heparin level 0.3-0.7 units/ml aPTT 66-102 seconds Monitor platelets by anticoagulation protocol: Yes   Plan:  -Continue heparin at 1400 units/hr -Will follow plans post cath  Hildred Laser, PharmD Clinical Pharmacist **Pharmacist  phone directory can now be found on [email or web address] ([email or web address]).  Listed under [place].

## 2020-10-10 NOTE — Hospital Course (Addendum)
F/U OP: - Lipid panel and LFT in 6-8 weeks  - consider hydroxyurea outpatient for myeloproliferative disorder  Ascending aortic aneurysm Continues to have aneurysm on limited ECHO although stable.  - Will need outpatient CT follow up - Will need EP for ablation in future  Combined CHF: Patient has hx of CM with EF as low as 35%, felt to be NICM although he had never had a formal ischemic evaluation. His most recent ECHO 08/15/20 showed an EF of 52% and an ascending aortic aneurysm, measuring 76mm. Limited ECHO here again showed reduced EF of 40-45% with global hypokinesis, mildly elevated PA systolic pressures, Mod-severe MR (likely functional), mild-mod TR, and Moderate AR with dilated IVC and aortic aneurysm of 25mm. Acute reduction in EF is likely 2/2 ischemia and A-Fib with RVR. CXR did show pulmonary edema with R > L infiltrates. SOB has improved significantly with diuresis.  - Cardiology following, appreciate their recommendations  - Will get 1 dose of IV Lasix tonight - Strict I&O - Daily weights    Transaminase: - had elevated transaminase, will need repeat in OP setting to make sure it resolves.

## 2020-10-10 NOTE — Progress Notes (Signed)
On arrival to holding area, patient was restless, trying to roll over, bed rt leg, wanting to sit up. Stated," I will never do this again". Instructed patient frequently he has to stay flat and keep rt leg straight. Sheeted rt leg. Instructed that he could bend his left leg which patient stated helped. Once transferring him to a bed, he calmed down.

## 2020-10-10 NOTE — Progress Notes (Signed)
Heart Failure Nurse Navigator Progress Note  PCP: Marianna Payment, MD PCP-Cardiologist: Linard Millers MD Admission Diagnosis: NSTEMI Admitted from: home with family   Presentation:   [name] presented with increasing SOB and chest pressure. Pleasant male sitting on side of bed on room air, no distress noted at time of interview.   Underwent cath via R groin 3/24 with PCI.   ECHO/ LVEF: 40-45%, LV mildly dilated with mild hypertrophy. Mod-severe MVR, mild-mod TVR, Mod AVR with mild-mod sclerosis present. AAA noted, measuring 63mm  Clinical Course:  Past Medical History:  Diagnosis Date  . Acute lower GI bleeding 02/15/2017  . Atrial fibrillation ([place])   . Cardiomyopathy    EF55% 11/14<<35%   . CHF (congestive heart failure) ([place])   . COPD (chronic obstructive pulmonary disease) ([place])   . Difficult airway for intubation    per telephone encounter notation  . Essential hypertension   . GERD (gastroesophageal reflux disease)   . Gout   . Hepatitis    Possible history  . History of atrial flutter    Ablation 2012  . 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 Obesity   . Pneumonia X 1  . Primary polycythemia ([place]) 06/12/2013  . Renal insufficiency   . Substance abuse ([place])    History of alcohol; hx cocaine, heroin, crack use  . Syncope   . Tubular adenoma of colon 08/2014     Social History   Socioeconomic History  . Marital status: Married    Spouse name: [name]  . Number of children: 1  . Years of education: Not on file  . Highest education level: 11th grade  Occupational History  . Occupation: Retired  Tobacco Use  . Smoking status: Current Every Day Smoker    Packs/day: 1.50    Years: 50.00    Pack years: 75.00    Types: Cigarettes  . Smokeless tobacco: Never Used  . Tobacco comment: 1-2 cigarettes a day for a couple years now. encouraged cessation  Vaping Use  . Vaping Use: Never used  Substance and Sexual Activity  . Alcohol use: Yes     Alcohol/week: 2.0 standard drinks    Types: 2 Shots of liquor per week    Comment: Sometimes.  . Drug use: Not Currently    Types: Heroin    Comment: stopped use in 2015. currently on suboxone  . Sexual activity: Not Currently  Other Topics Concern  . Not on file  Social History Narrative   Moved here from [place]. Lives with common law wife and grandchildren. He is retired from "general labor."   Social Determinants of Radio broadcast assistant Strain: Low Risk   . Difficulty of Paying Living Expenses: Not hard at all  Food Insecurity: No Food Insecurity  . Worried About Charity fundraiser in the Last Year: Never true  . Ran Out of Food in the Last Year: Never true  Transportation Needs: No Transportation Needs  . Lack of Transportation (Medical): No  . Lack of Transportation (Non-Medical): No  Physical Activity: Not on file  Stress: Not on file  Social Connections: Not on file    High Risk Criteria for Readmission and/or Poor Patient Outcomes:  Heart failure hospital admissions (last 6 months): 1  No Show rate: 8%  Difficult social situation: no  Demonstrates medication adherence: yes  Primary Language: English and Spanish  Literacy level: able to read/write and comprehend  Education Assessment and Provision:  Detailed education and instructions provided on  heart failure disease management including the following:  Signs and symptoms of Heart Failure When to call the physician Importance of daily weights Low sodium diet Fluid restriction Medication management Anticipated future follow-up appointments  Patient education given on each of the above topics.  Patient acknowledges understanding via teach back method and acceptance of all instructions.  Education Materials:  "Living Better With Heart Failure" Booklet, HF zone tool, & Daily Weight Tracker Tool.  Patient has scale at home: no, will get at [facility] TOC appt.  Patient has pill box at home: no, pt  declined. Stated he can use the bottles well.   Barriers of Care:   -none  Considerations/Referrals:   Referral made to Heart Failure Pharmacist Stewardship: yes, at bedside Referral made to Heart & Vascular TOC clinic: yes, 3/29 @ 10AM  Items for Follow-up on DC/TOC: -medication optimization -HF follow up education -scale prescription needed for medicaid via [employer].  Pricilla Holm, RN, BSN Heart Failure Nurse Navigator [phone]

## 2020-10-10 NOTE — H&P (View-Only) (Signed)
The patient has been seen in conjunction with Vin Bhagat, PAC. All aspects of care have been considered and discussed. The patient has been personally interviewed, examined, and all clinical data has been reviewed.   Had terrible back pain during diagnostic procedure yesterday.  Reluctant to have further invasive procedures.  We discussed the importance of getting the LAD stented.  After some consideration, he is willing to proceed.  Kidney function is stable.  He is in A. fib with marginal heart rate control earlier this morning.  Heart rates are now under 100 bpm.  Plan to continue IV amiodarone.  After PCI, will engage EP to consider ablation.  Radial approach may be more comfortable however I do note that Dr. Angelena Form was not able to achieve right radial access yesterday.   Progress Note  Patient Name: [name] Date of Encounter: 10/10/2020  [facility] Cardiologist: Sinclair Grooms, MD   Subjective   Severe back pain after cath yesterday, now improved.  No chest pain.  No palpitation or shortness of breath. Has bleeding from right forearm IV site. Initially unwilling to sign consent for PCI today, now okay.  Inpatient Medications    Scheduled Meds: . buprenorphine-naloxone  2 tablet Sublingual Daily  . clopidogrel  75 mg Oral Daily  . lidocaine  1 patch Transdermal Q24H  . metoprolol succinate  50 mg Oral Daily  . ramelteon  8 mg Oral QHS  . roflumilast  500 mcg Oral Daily  . sodium chloride flush  3 mL Intravenous Q12H  . sodium chloride flush  3 mL Intravenous Q12H   Continuous Infusions: . sodium chloride    . sodium chloride    . sodium chloride 10 mL/hr at 10/10/20 0631  . amiodarone 30 mg/hr (10/10/20 0239)  . heparin 1,400 Units/hr (10/10/20 0448)   PRN Meds: sodium chloride, sodium chloride, acetaminophen **OR** acetaminophen, albuterol, methocarbamol, sodium chloride flush, sodium chloride flush   Vital Signs    Vitals:    10/09/20 2005 10/10/20 0005 10/10/20 0632 10/10/20 0700  BP: (!) 139/98 116/73 (!) 105/57 119/76  Pulse: (!) 139 100  98  Resp: 16 15  14   Temp: 98.1 F (36.7 C) 98 F (36.7 C) 97.9 F (36.6 C) 97.6 F (36.4 C)  TempSrc: Oral Oral Oral Oral  SpO2: 93% 96%  98%  Weight:   93.6 kg   Height:        Intake/Output Summary (Last 24 hours) at 10/10/2020 1027 Last data filed at 10/09/2020 2033 Gross per 24 hour  Intake 592.97 ml  Output --  Net 592.97 ml   Last 3 Weights 10/10/2020 10/09/2020 10/08/2020  Weight (lbs) 206 lb 6.4 oz 209 lb 11.2 oz 215 lb  Weight (kg) 93.622 kg 95.119 kg 97.523 kg      Telemetry    Atrial flutter at rate of 80s- Personally Reviewed  ECG    No new tracing   Physical Exam   GEN: No acute distress.   Neck: No JVD Cardiac: RRR, no murmurs, rubs, or gallops.  Unremarkable right groin cath site Respiratory: Clear to auscultation bilaterally. GI: Soft, nontender, non-distended  MS: No edema; No deformity. Neuro:  Nonfocal  Psych: Normal affect   Labs    High Sensitivity Troponin:   Recent Labs  Lab 10/08/20 0825 10/08/20 1131 10/08/20 1644 10/08/20 2020 10/09/20 0810  TROPONINIHS 54* 1,637* 9,404* 11,331* 5,587*      Chemistry Recent Labs  Lab 10/08/20 0825 10/08/20 0853 10/08/20  7681 10/09/20 0301 10/09/20 1734 10/09/20 1735 10/10/20 0300  NA 140   < > 143 137 144 144 137  K 3.4*   < > 3.4* 3.8 4.1 4.0 4.0  CL 108  --  110 106  --   --  106  CO2 20*  --   --  23  --   --  23  GLUCOSE 170*  --  172* 114*  --   --  88  BUN 17  --  20 24*  --   --  19  CREATININE 1.44*  --  1.50* 1.32*  --   --  1.42*  CALCIUM 8.4*  --   --  8.2*  --   --  8.3*  PROT 7.3  --   --   --   --   --  7.2  ALBUMIN 3.1*  --   --   --   --   --  3.1*  AST 18  --   --   --   --   --  31  ALT 13  --   --   --   --   --  15  ALKPHOS 93  --   --   --   --   --  77  BILITOT 0.7  --   --   --   --   --  0.8  GFRNONAA 51*  --   --  57*  --   --  52*   ANIONGAP 12  --   --  8  --   --  8   < > = values in this interval not displayed.     Hematology Recent Labs  Lab 10/08/20 2020 10/09/20 0810 10/09/20 1734 10/09/20 1735 10/10/20 0300  WBC 11.4* 10.3  --   --  11.5*  RBC 5.03 4.99  --   --  4.97  HGB 15.9 15.6 14.6 14.3 15.4  HCT 47.7 47.2 43.0 42.0 47.5  MCV 94.8 94.6  --   --  95.6  MCH 31.6 31.3  --   --  31.0  MCHC 33.3 33.1  --   --  32.4  RDW 13.6 13.6  --   --  13.6  PLT 243 232  --   --  225    BNP Recent Labs  Lab 10/08/20 0825  BNP 250.2*     DDimer No results for input(s): DDIMER in the last 168 hours.   Radiology    CARDIAC CATHETERIZATION  Result Date: 10/09/2020  Prox LAD lesion is 95% stenosed.  1. Severe stenosis proximal to mid LAD 2. No obstructive disease in the RCA or Circumflex atery 3. Elevated right and left heart pressures.   ECHOCARDIOGRAM LIMITED  Result Date: 10/09/2020    ECHOCARDIOGRAM LIMITED REPORT   Patient Name:   LARNELL GRANLUND [facility] Date of Exam: 10/09/2020 Medical Rec #:  [id number]                Height:       66.0 in Accession #:    [id number]               Weight:       209.7 lb Date of Birth:  1945/10/29               BSA:          2.040 m Patient Age:    7 years  BP:           134/64 mmHg Patient Gender: M                        HR:           92 bpm. Exam Location:  Inpatient Procedure: Limited Color Doppler, Cardiac Doppler, Limited Echo and 3D Echo Indications:    NSTEMI I21.4  History:        Patient has prior history of Echocardiogram examinations, most                 recent 08/15/2020. CHF, COPD, Aortic Valve Disease and Mitral                 Valve Disease, Arrythmias:Atrial Fibrillation,                 Signs/Symptoms:Syncope; Risk Factors:Current Smoker.                 Cardiomyopathy. Ascending aortic aneurysm. Chronic kidney                 disease.  Sonographer:    Darlina Sicilian RDCS Referring Phys: [id number] Abigail Butts  Sonographer Comments:  Global longitudinal strain was attempted. Patient had two IV's both are infiltrated, RN notified. IMPRESSIONS  1. Left ventricular ejection fraction, by estimation, is 40 to 45%. The left ventricle has mildly decreased function. The left ventricle demonstrates global hypokinesis. The left ventricular internal cavity size was mildly dilated. There is mild left ventricular hypertrophy. Left ventricular diastolic parameters are indeterminate.  2. Right ventricular systolic function is normal. The right ventricular size is normal. There is mildly elevated pulmonary artery systolic pressure. The estimated right ventricular systolic pressure is 64.4 mmHg.  3. The mitral valve is normal in structure. Moderate to severe mitral valve regurgitation. Central MR jet, appears functional.  4. Tricuspid valve regurgitation is mild to moderate.  5. The aortic valve is tricuspid. Aortic valve regurgitation is moderate. Mild to moderate aortic valve sclerosis/calcification is present, without any evidence of aortic stenosis.  6. Aortic dilatation noted. Aneurysm of the ascending aorta, measuring 49 mm.  7. The inferior vena cava is dilated in size with >50% respiratory variability, suggesting right atrial pressure of 8 mmHg. FINDINGS  Left Ventricle: Left ventricular ejection fraction, by estimation, is 40 to 45%. The left ventricle has mildly decreased function. The left ventricle demonstrates global hypokinesis. The left ventricular internal cavity size was mildly dilated. There is  mild left ventricular hypertrophy. Left ventricular diastolic parameters are indeterminate. Right Ventricle: The right ventricular size is normal. No increase in right ventricular wall thickness. Right ventricular systolic function is normal. There is mildly elevated pulmonary artery systolic pressure. The tricuspid regurgitant velocity is 2.81  m/s, and with an assumed right atrial pressure of 8 mmHg, the estimated right ventricular systolic pressure  is 03.4 mmHg. Pericardium: There is no evidence of pericardial effusion. Mitral Valve: The mitral valve is normal in structure. Moderate to severe mitral valve regurgitation. Tricuspid Valve: The tricuspid valve is normal in structure. Tricuspid valve regurgitation is mild to moderate. Aortic Valve: The aortic valve is tricuspid. Aortic valve regurgitation is moderate. Aortic regurgitation PHT measures 383 msec. Mild to moderate aortic valve sclerosis/calcification is present, without any evidence of aortic stenosis. Pulmonic Valve: The pulmonic valve was grossly normal. Pulmonic valve regurgitation is trivial. Aorta: The aortic root is normal in size and structure and aortic dilatation noted. There is an  aneurysm involving the ascending aorta measuring 49 mm. Venous: The inferior vena cava is dilated in size with greater than 50% respiratory variability, suggesting right atrial pressure of 8 mmHg. LEFT VENTRICLE PLAX 2D LVIDd:         6.00 cm LVIDs:         5.00 cm LV PW:         1.00 cm LV IVS:        1.10 cm LVOT diam:     2.20 cm LV SV:         75 LV SV Index:   37 LVOT Area:     3.80 cm  LV Volumes (MOD) LV vol d, MOD A2C: 165.0 ml LV vol d, MOD A4C: 174.0 ml LV vol s, MOD A2C: 110.0 ml LV vol s, MOD A4C: 93.9 ml LV SV MOD A2C:     55.0 ml LV SV MOD A4C:     174.0 ml LV SV MOD BP:      68.1 ml LEFT ATRIUM         Index LA diam:    3.80 cm 1.86 cm/m  AORTIC VALVE LVOT Vmax:   119.50 cm/s LVOT Vmean:  84.750 cm/s LVOT VTI:    0.197 m AI PHT:      383 msec  AORTA Ao Root diam: 3.70 cm Ao Asc diam:  4.90 cm MITRAL VALVE                 TRICUSPID VALVE MV Area (PHT): 4.82 cm      TR Peak grad:   31.6 mmHg MV Decel Time: 157 msec      TR Vmax:        281.00 cm/s MR Peak grad:    100.0 mmHg MR Mean grad:    72.0 mmHg   SHUNTS MR Vmax:         500.00 cm/s Systemic VTI:  0.20 m MR Vmean:        409.0 cm/s  Systemic Diam: 2.20 cm MR PISA:         2.26 cm MR PISA Eff ROA: 15 mm MR PISA Radius:  0.60 cm MV E velocity:  136.67 cm/s Oswaldo Milian MD Electronically signed by Oswaldo Milian MD Signature Date/Time: 10/09/2020/11:29:34 AM    Final     Cardiac Studies  Cath 10/10/20 RIGHT/LEFT HEART CATH AND CORONARY ANGIOGRAPHY    Conclusion    Prox LAD lesion is 95% stenosed.   1. Severe stenosis proximal to mid LAD 2. No obstructive disease in the RCA or Circumflex atery 3. Elevated right and left heart pressures.   Diagnostic Dominance: Right     Echo 10/09/20 1. Left ventricular ejection fraction, by estimation, is 40 to 45%. The  left ventricle has mildly decreased function. The left ventricle  demonstrates global hypokinesis. The left ventricular internal cavity size  was mildly dilated. There is mild left  ventricular hypertrophy. Left ventricular diastolic parameters are  indeterminate.  2. Right ventricular systolic function is normal. The right ventricular  size is normal. There is mildly elevated pulmonary artery systolic  pressure. The estimated right ventricular systolic pressure is 13.0 mmHg.  3. The mitral valve is normal in structure. Moderate to severe mitral  valve regurgitation. Central MR jet, appears functional.  4. Tricuspid valve regurgitation is mild to moderate.  5. The aortic valve is tricuspid. Aortic valve regurgitation is moderate.  Mild to moderate aortic valve sclerosis/calcification is present, without  any evidence of aortic  stenosis.  6. Aortic dilatation noted. Aneurysm of the ascending aorta, measuring 49  mm.  7. 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   Patient Profile     75 y.o. male  with a PMH of chronic combined CHF/NICM, atrial flutter s/p ablation,paroxysmalatrial fibrillation oneliquis, HTN, COPD, polycythemia vera, CKD stage 3, polysubstance abuse (ETOH, heroin, cocaine) on suboxone,and ascending aortic aneurysm who was seen for evaluation  ofacute on chronic SOB and elevated HsTropat the request ofDr. Dareen Piano.  Assessment & Plan    1.  Non-STEMI -High-sensitivity troponin peaked above 11,000.  Eliquis held and treated with heparin. -Cardiac cath yesterday showed 95% proximal LAD stenosis.  Plan for PCI today. -Eliquis on hold -Now started on Plavix -Likely will be on triple therapy for 1 month and then stop aspirin -Add statin  2.  Paroxysmal atrial fibrillation -Found to have A. fib RVR on presentation with spontaneous conversion to sinus rhythm on IV amiodarone however had recurrent A. fib/flutter RVR.  Restarted IV amiodarone, heart rate now improved to 80 to 90s -Switch to p.o. amiodarone post PCI -Likely EP eval for possible ablation -Eliquis on hold for cath -Continue anticoagulation with heparin -Continue XL 50 mg daily  3.  Acute on chronic combined CHF -Prior history of nonischemic cardiomyopathy -Echo this admission showed LV function of 40 to 45 % (was 50% 07/2020) -Elevated right and left heart pressure by cath yesterday -Not volume overload on exam -Continue Toprol-XL -We will plan to add Wilder Glade and Entresto for losartan post PCI (cost is $ 75for each drug)  4. HLD - 10/09/2020: Cholesterol 133; HDL 31; LDL Cholesterol 83; Triglycerides 96; VLDL 19  -Start high intensity statin -Lipid panel and LFT in 6 to 8 weeks  5.  CKD stage III -Baseline Creatinine 1.2-1.4 - Scr 1.44>>1.5>>1.32>>1.42   For questions or updates, please contact [place] Please consult [email or web address] for contact info under        SignedLeanor Kail, PA  10/10/2020, 10:27 AM

## 2020-10-10 NOTE — Progress Notes (Signed)
Attempted to get consent for Cardiac cath procedure, patient states that he does not want to have the procedure done because his back hurts on the procedure table. Internal medicine MD made aware of back pain, medications have been given as MD ordered.  Will continue to monitor.    Donah Driver, RN

## 2020-10-10 NOTE — Progress Notes (Addendum)
The patient has been seen in conjunction with Vin Bhagat, PAC. All aspects of care have been considered and discussed. The patient has been personally interviewed, examined, and all clinical data has been reviewed.   Had terrible back pain during diagnostic procedure yesterday.  Reluctant to have further invasive procedures.  We discussed the importance of getting the LAD stented.  After some consideration, he is willing to proceed.  Kidney function is stable.  He is in A. fib with marginal heart rate control earlier this morning.  Heart rates are now under 100 bpm.  Plan to continue IV amiodarone.  After PCI, will engage EP to consider ablation.  Radial approach may be more comfortable however I do note that Dr. Angelena Form was not able to achieve right radial access yesterday.   Progress Note  Patient Name: [name] Date of Encounter: 10/10/2020  [facility] Cardiologist: Sinclair Grooms, MD   Subjective   Severe back pain after cath yesterday, now improved.  No chest pain.  No palpitation or shortness of breath. Has bleeding from right forearm IV site. Initially unwilling to sign consent for PCI today, now okay.  Inpatient Medications    Scheduled Meds: . buprenorphine-naloxone  2 tablet Sublingual Daily  . clopidogrel  75 mg Oral Daily  . lidocaine  1 patch Transdermal Q24H  . metoprolol succinate  50 mg Oral Daily  . ramelteon  8 mg Oral QHS  . roflumilast  500 mcg Oral Daily  . sodium chloride flush  3 mL Intravenous Q12H  . sodium chloride flush  3 mL Intravenous Q12H   Continuous Infusions: . sodium chloride    . sodium chloride    . sodium chloride 10 mL/hr at 10/10/20 0631  . amiodarone 30 mg/hr (10/10/20 0239)  . heparin 1,400 Units/hr (10/10/20 0448)   PRN Meds: sodium chloride, sodium chloride, acetaminophen **OR** acetaminophen, albuterol, methocarbamol, sodium chloride flush, sodium chloride flush   Vital Signs    Vitals:    10/09/20 2005 10/10/20 0005 10/10/20 0632 10/10/20 0700  BP: (!) 139/98 116/73 (!) 105/57 119/76  Pulse: (!) 139 100  98  Resp: 16 15  14   Temp: 98.1 F (36.7 C) 98 F (36.7 C) 97.9 F (36.6 C) 97.6 F (36.4 C)  TempSrc: Oral Oral Oral Oral  SpO2: 93% 96%  98%  Weight:   93.6 kg   Height:        Intake/Output Summary (Last 24 hours) at 10/10/2020 1027 Last data filed at 10/09/2020 2033 Gross per 24 hour  Intake 592.97 ml  Output -  Net 592.97 ml   Last 3 Weights 10/10/2020 10/09/2020 10/08/2020  Weight (lbs) 206 lb 6.4 oz 209 lb 11.2 oz 215 lb  Weight (kg) 93.622 kg 95.119 kg 97.523 kg      Telemetry    Atrial flutter at rate of 80s- Personally Reviewed  ECG    No new tracing   Physical Exam   GEN: No acute distress.   Neck: No JVD Cardiac: RRR, no murmurs, rubs, or gallops.  Unremarkable right groin cath site Respiratory: Clear to auscultation bilaterally. GI: Soft, nontender, non-distended  MS: No edema; No deformity. Neuro:  Nonfocal  Psych: Normal affect   Labs    High Sensitivity Troponin:   Recent Labs  Lab 10/08/20 0825 10/08/20 1131 10/08/20 1644 10/08/20 2020 10/09/20 0810  TROPONINIHS 54* 1,637* 9,404* 11,331* 5,587*      Chemistry Recent Labs  Lab 10/08/20 0825 10/08/20 0853 10/08/20  3536 10/09/20 0301 10/09/20 1734 10/09/20 1735 10/10/20 0300  NA 140   < > 143 137 144 144 137  K 3.4*   < > 3.4* 3.8 4.1 4.0 4.0  CL 108  --  110 106  --   --  106  CO2 20*  --   --  23  --   --  23  GLUCOSE 170*  --  172* 114*  --   --  88  BUN 17  --  20 24*  --   --  19  CREATININE 1.44*  --  1.50* 1.32*  --   --  1.42*  CALCIUM 8.4*  --   --  8.2*  --   --  8.3*  PROT 7.3  --   --   --   --   --  7.2  ALBUMIN 3.1*  --   --   --   --   --  3.1*  AST 18  --   --   --   --   --  31  ALT 13  --   --   --   --   --  15  ALKPHOS 93  --   --   --   --   --  77  BILITOT 0.7  --   --   --   --   --  0.8  GFRNONAA 51*  --   --  57*  --   --  52*   ANIONGAP 12  --   --  8  --   --  8   < > = values in this interval not displayed.     Hematology Recent Labs  Lab 10/08/20 2020 10/09/20 0810 10/09/20 1734 10/09/20 1735 10/10/20 0300  WBC 11.4* 10.3  --   --  11.5*  RBC 5.03 4.99  --   --  4.97  HGB 15.9 15.6 14.6 14.3 15.4  HCT 47.7 47.2 43.0 42.0 47.5  MCV 94.8 94.6  --   --  95.6  MCH 31.6 31.3  --   --  31.0  MCHC 33.3 33.1  --   --  32.4  RDW 13.6 13.6  --   --  13.6  PLT 243 232  --   --  225    BNP Recent Labs  Lab 10/08/20 0825  BNP 250.2*     DDimer No results for input(s): DDIMER in the last 168 hours.   Radiology    CARDIAC CATHETERIZATION  Result Date: 10/09/2020  Prox LAD lesion is 95% stenosed.  1. Severe stenosis proximal to mid LAD 2. No obstructive disease in the RCA or Circumflex atery 3. Elevated right and left heart pressures.   ECHOCARDIOGRAM LIMITED  Result Date: 10/09/2020    ECHOCARDIOGRAM LIMITED REPORT   Patient Name:   TEVIN SHILLINGFORD [facility] Date of Exam: 10/09/2020 Medical Rec #:  [id number]                Height:       66.0 in Accession #:    [id number]               Weight:       209.7 lb Date of Birth:  04-Mar-1946               BSA:          2.040 m Patient Age:    75 years  BP:           134/64 mmHg Patient Gender: M                        HR:           92 bpm. Exam Location:  Inpatient Procedure: Limited Color Doppler, Cardiac Doppler, Limited Echo and 3D Echo Indications:    NSTEMI I21.4  History:        Patient has prior history of Echocardiogram examinations, most                 recent 08/15/2020. CHF, COPD, Aortic Valve Disease and Mitral                 Valve Disease, Arrythmias:Atrial Fibrillation,                 Signs/Symptoms:Syncope; Risk Factors:Current Smoker.                 Cardiomyopathy. Ascending aortic aneurysm. Chronic kidney                 disease.  Sonographer:    Darlina Sicilian RDCS Referring Phys: [id number] Abigail Butts  Sonographer Comments:  Global longitudinal strain was attempted. Patient had two IV's both are infiltrated, RN notified. IMPRESSIONS  1. Left ventricular ejection fraction, by estimation, is 40 to 45%. The left ventricle has mildly decreased function. The left ventricle demonstrates global hypokinesis. The left ventricular internal cavity size was mildly dilated. There is mild left ventricular hypertrophy. Left ventricular diastolic parameters are indeterminate.  2. Right ventricular systolic function is normal. The right ventricular size is normal. There is mildly elevated pulmonary artery systolic pressure. The estimated right ventricular systolic pressure is 29.5 mmHg.  3. The mitral valve is normal in structure. Moderate to severe mitral valve regurgitation. Central MR jet, appears functional.  4. Tricuspid valve regurgitation is mild to moderate.  5. The aortic valve is tricuspid. Aortic valve regurgitation is moderate. Mild to moderate aortic valve sclerosis/calcification is present, without any evidence of aortic stenosis.  6. Aortic dilatation noted. Aneurysm of the ascending aorta, measuring 49 mm.  7. The inferior vena cava is dilated in size with >50% respiratory variability, suggesting right atrial pressure of 8 mmHg. FINDINGS  Left Ventricle: Left ventricular ejection fraction, by estimation, is 40 to 45%. The left ventricle has mildly decreased function. The left ventricle demonstrates global hypokinesis. The left ventricular internal cavity size was mildly dilated. There is  mild left ventricular hypertrophy. Left ventricular diastolic parameters are indeterminate. Right Ventricle: The right ventricular size is normal. No increase in right ventricular wall thickness. Right ventricular systolic function is normal. There is mildly elevated pulmonary artery systolic pressure. The tricuspid regurgitant velocity is 2.81  m/s, and with an assumed right atrial pressure of 8 mmHg, the estimated right ventricular systolic pressure  is 62.1 mmHg. Pericardium: There is no evidence of pericardial effusion. Mitral Valve: The mitral valve is normal in structure. Moderate to severe mitral valve regurgitation. Tricuspid Valve: The tricuspid valve is normal in structure. Tricuspid valve regurgitation is mild to moderate. Aortic Valve: The aortic valve is tricuspid. Aortic valve regurgitation is moderate. Aortic regurgitation PHT measures 383 msec. Mild to moderate aortic valve sclerosis/calcification is present, without any evidence of aortic stenosis. Pulmonic Valve: The pulmonic valve was grossly normal. Pulmonic valve regurgitation is trivial. Aorta: The aortic root is normal in size and structure and aortic dilatation noted. There is an  aneurysm involving the ascending aorta measuring 49 mm. Venous: The inferior vena cava is dilated in size with greater than 50% respiratory variability, suggesting right atrial pressure of 8 mmHg. LEFT VENTRICLE PLAX 2D LVIDd:         6.00 cm LVIDs:         5.00 cm LV PW:         1.00 cm LV IVS:        1.10 cm LVOT diam:     2.20 cm LV SV:         75 LV SV Index:   37 LVOT Area:     3.80 cm  LV Volumes (MOD) LV vol d, MOD A2C: 165.0 ml LV vol d, MOD A4C: 174.0 ml LV vol s, MOD A2C: 110.0 ml LV vol s, MOD A4C: 93.9 ml LV SV MOD A2C:     55.0 ml LV SV MOD A4C:     174.0 ml LV SV MOD BP:      68.1 ml LEFT ATRIUM         Index LA diam:    3.80 cm 1.86 cm/m  AORTIC VALVE LVOT Vmax:   119.50 cm/s LVOT Vmean:  84.750 cm/s LVOT VTI:    0.197 m AI PHT:      383 msec  AORTA Ao Root diam: 3.70 cm Ao Asc diam:  4.90 cm MITRAL VALVE                 TRICUSPID VALVE MV Area (PHT): 4.82 cm      TR Peak grad:   31.6 mmHg MV Decel Time: 157 msec      TR Vmax:        281.00 cm/s MR Peak grad:    100.0 mmHg MR Mean grad:    72.0 mmHg   SHUNTS MR Vmax:         500.00 cm/s Systemic VTI:  0.20 m MR Vmean:        409.0 cm/s  Systemic Diam: 2.20 cm MR PISA:         2.26 cm MR PISA Eff ROA: 15 mm MR PISA Radius:  0.60 cm MV E velocity:  136.67 cm/s Oswaldo Milian MD Electronically signed by Oswaldo Milian MD Signature Date/Time: 10/09/2020/11:29:34 AM    Final     Cardiac Studies  Cath 10/10/20 RIGHT/LEFT HEART CATH AND CORONARY ANGIOGRAPHY    Conclusion    Prox LAD lesion is 95% stenosed.   1. Severe stenosis proximal to mid LAD 2. No obstructive disease in the RCA or Circumflex atery 3. Elevated right and left heart pressures.   Diagnostic Dominance: Right     Echo 10/09/20 1. Left ventricular ejection fraction, by estimation, is 40 to 45%. The  left ventricle has mildly decreased function. The left ventricle  demonstrates global hypokinesis. The left ventricular internal cavity size  was mildly dilated. There is mild left  ventricular hypertrophy. Left ventricular diastolic parameters are  indeterminate.  2. Right ventricular systolic function is normal. The right ventricular  size is normal. There is mildly elevated pulmonary artery systolic  pressure. The estimated right ventricular systolic pressure is 17.6 mmHg.  3. The mitral valve is normal in structure. Moderate to severe mitral  valve regurgitation. Central MR jet, appears functional.  4. Tricuspid valve regurgitation is mild to moderate.  5. The aortic valve is tricuspid. Aortic valve regurgitation is moderate.  Mild to moderate aortic valve sclerosis/calcification is present, without  any evidence of aortic  stenosis.  6. Aortic dilatation noted. Aneurysm of the ascending aorta, measuring 49  mm.  7. 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   Patient Profile     75 y.o. male  with a PMH of chronic combined CHF/NICM, atrial flutter s/p ablation,paroxysmalatrial fibrillation oneliquis, HTN, COPD, polycythemia vera, CKD stage 3, polysubstance abuse (ETOH, heroin, cocaine) on suboxone,and ascending aortic aneurysm who was seen for evaluation  ofacute on chronic SOB and elevated HsTropat the request ofDr. Dareen Piano.  Assessment & Plan    1.  Non-STEMI -High-sensitivity troponin peaked above 11,000.  Eliquis held and treated with heparin. -Cardiac cath yesterday showed 95% proximal LAD stenosis.  Plan for PCI today. -Eliquis on hold -Now started on Plavix -Likely will be on triple therapy for 1 month and then stop aspirin -Add statin  2.  Paroxysmal atrial fibrillation -Found to have A. fib RVR on presentation with spontaneous conversion to sinus rhythm on IV amiodarone however had recurrent A. fib/flutter RVR.  Restarted IV amiodarone, heart rate now improved to 80 to 90s -Switch to p.o. amiodarone post PCI -Likely EP eval for possible ablation -Eliquis on hold for cath -Continue anticoagulation with heparin -Continue XL 50 mg daily  3.  Acute on chronic combined CHF -Prior history of nonischemic cardiomyopathy -Echo this admission showed LV function of 40 to 45 % (was 50% 07/2020) -Elevated right and left heart pressure by cath yesterday -Not volume overload on exam -Continue Toprol-XL -We will plan to add Wilder Glade and Entresto for losartan post PCI (cost is $ 43for each drug)  4. HLD - 10/09/2020: Cholesterol 133; HDL 31; LDL Cholesterol 83; Triglycerides 96; VLDL 19  -Start high intensity statin -Lipid panel and LFT in 6 to 8 weeks  5.  CKD stage III -Baseline Creatinine 1.2-1.4 - Scr 1.44>>1.5>>1.32>>1.42   For questions or updates, please contact [place] Please consult [email or web address] for contact info under        SignedLeanor Kail, PA  10/10/2020, 10:27 AM

## 2020-10-10 NOTE — Progress Notes (Signed)
Sheath was removed per order and pressure was held for 40 minutes, holding stopped at 2130. The patient is hemodynamically stable  The site is at a level 1. Will continue to monitor.

## 2020-10-10 NOTE — Progress Notes (Signed)
Subjective:   [name] says he feels his breathing has significantly improved since being in the hospital and denies any current chest pressure, pain, or palpitations. He denies worsening lower extremity swelling or cough. He confirms he was unable to lay flat for his cath, and says he would rather not go back to cath for PCI given his ongoing back pain. Denied improvement in his pain with muscle relaxers or sleep medication overnight. He states it has been years since he last used IV drugs.   Objective:  Vital signs in last 24 hours: Vitals:   10/10/20 0005 10/10/20 0632 10/10/20 0700 10/10/20 1100  BP: 116/73 (!) 105/57 119/76 112/66  Pulse: 100  98 86  Resp: 15  14 15   Temp: 98 F (36.7 C) 97.9 F (36.6 C) 97.6 F (36.4 C) (!) 97.4 F (36.3 C)  TempSrc: Oral Oral Oral Oral  SpO2: 96%  98% 96%  Weight:  93.6 kg    Height:       General: Patient is obese. No acute distress. HENT: MMM. No nasal discharge. Respiratory: Patient is tachypneic with mild increased work of breathing, although lungs are CTA, bilaterally without wheezing or rales.  Cardiovascular: Rate is intermittently tachycardic, in the 90-100's. Rhythm is irregularly irregular. There is trace bilateral pitting edema. LE's are cool to the touch.  Neurological: Alert and oriented x 3. Psych: Normal affect. Normal tone of voice.   Assessment/Plan:  Active Problems:   Atrial fibrillation with RVR (HCC)   Non-ST elevation (NSTEMI) myocardial infarction (HCC)  Severe Stenosis Proximal to mid LAD  EKG showed ST depressions in the lateral leads. Troponin's peaked today at 11,331. He has a known history of CAD per CT scan. R/LHC yesterday showed proximal LAD lesion with 95% stenosis and elevated R and L heart pressures. He was unable to proceed with PCI yesterday due to severe back pain, but after some discussion with cardiology, is now interested in proceeding. - Cardiology following, appreciate their  recommendation. - Plan for LAD PCI today - Will provide Lidocaine patch for his back pain  - Heparin drip to resume 8 hours after sheath pull   Persistent A-Fib, Rates Improving  Patient denies missing any Eliquis dosing at home. His rates are better controlled today, intermittently < 100bpm. - Cardiology following, appreciate their recommendations  - Continue amiodarone gtt  - Heparin drip  - After PCI, Cardiology reaching out to EP for consideration of ablation   Acute on Chronic Combined CHF Patient has hx of CM with EF as low as 35%, felt to be NICM although he had never had a formal ischemic evaluation. Limited ECHO here again showed reduced EF of 40-45% with global hypokinesis, mildly elevated PA systolic pressures, ~moderate MR, TR and AR, with dilated IVC and aortic aneurysm of 81mm. Acute reduction in EF is likely 2/2 ischemia and A-Fib with RVR. CXR did show pulmonary edema with R > L infiltrates. He continues to remain tachypneic with mild increased work of breathing, although is much improved after diuresis.  - Cardiology following, appreciate their recommendations  - Strict I&O - Daily weights   Hypertension: Blood pressures are not as soft as during admission. Remain stable.  - Continue to monitor  - Holding antihypertensives for now   CKD 3 Hypokalemia, Resolved Creatinine improved from admission ~1.5 to baseline ~1.3. GFR 57. Patient appears to be at his baseline renal function with normalization of potassium.  - Daily BMP  Polycythemia vera Patient's initial elevated hemoglobin  normalized during admission.  - Daily CBC   OUD - Continue home suboxone 8-2 mg 2 films daily  Prior to Admission Living Arrangement: Home alone Anticipated Discharge Location: Pending PT/OT evaluation after PCI Barriers to Discharge: Continued medical evaluation  Jeralyn Bennett, MD 10/10/2020, 2:23 PM Pager: [phone] After 5pm on weekdays and 1pm on weekends: On Call pager  [phone]

## 2020-10-10 NOTE — TOC Benefit Eligibility Note (Signed)
Patient Teacher, English as a foreign language completed.    The patient is currently admitted and upon discharge will be taking Entresto 24mg /26mg .  The current 30 day co-pay is, $4.00.   The patient is currently admitted and upon discharge will be taking Farxiga 10 mg.  The current 30 day co-pay is, $4.00.   The patient is currently admitted and upon discharge will be taking Jardiance 10 mg.  The current 30 day co-pay is, $4.00.   The patient is insured through [employer], [place] Patient Advocate Specialist [place] Team Direct Number: [phone]  Fax: [phone]

## 2020-10-11 ENCOUNTER — Encounter (HOSPITAL_COMMUNITY): Payer: Self-pay | Admitting: Cardiovascular Disease

## 2020-10-11 DIAGNOSIS — I2510 Atherosclerotic heart disease of native coronary artery without angina pectoris: Secondary | ICD-10-CM

## 2020-10-11 LAB — CBC WITH DIFFERENTIAL/PLATELET
Abs Immature Granulocytes: 0.05 10*3/uL (ref 0.00–0.07)
Basophils Absolute: 0 10*3/uL (ref 0.0–0.1)
Basophils Relative: 0 %
Eosinophils Absolute: 0.1 10*3/uL (ref 0.0–0.5)
Eosinophils Relative: 1 %
HCT: 45.2 % (ref 39.0–52.0)
Hemoglobin: 14.8 g/dL (ref 13.0–17.0)
Immature Granulocytes: 1 %
Lymphocytes Relative: 11 %
Lymphs Abs: 1.1 10*3/uL (ref 0.7–4.0)
MCH: 31.2 pg (ref 26.0–34.0)
MCHC: 32.7 g/dL (ref 30.0–36.0)
MCV: 95.2 fL (ref 80.0–100.0)
Monocytes Absolute: 0.9 10*3/uL (ref 0.1–1.0)
Monocytes Relative: 9 %
Neutro Abs: 7.7 10*3/uL (ref 1.7–7.7)
Neutrophils Relative %: 78 %
Platelets: 226 10*3/uL (ref 150–400)
RBC: 4.75 MIL/uL (ref 4.22–5.81)
RDW: 13.7 % (ref 11.5–15.5)
WBC: 9.9 10*3/uL (ref 4.0–10.5)
nRBC: 0 % (ref 0.0–0.2)

## 2020-10-11 LAB — COMPREHENSIVE METABOLIC PANEL
ALT: 16 U/L (ref 0–44)
AST: 44 U/L — ABNORMAL HIGH (ref 15–41)
Albumin: 2.9 g/dL — ABNORMAL LOW (ref 3.5–5.0)
Alkaline Phosphatase: 71 U/L (ref 38–126)
Anion gap: 8 (ref 5–15)
BUN: 19 mg/dL (ref 8–23)
CO2: 24 mmol/L (ref 22–32)
Calcium: 8.2 mg/dL — ABNORMAL LOW (ref 8.9–10.3)
Chloride: 105 mmol/L (ref 98–111)
Creatinine, Ser: 1.42 mg/dL — ABNORMAL HIGH (ref 0.61–1.24)
GFR, Estimated: 52 mL/min — ABNORMAL LOW (ref >60)
Glucose, Bld: 120 mg/dL — ABNORMAL HIGH (ref 70–99)
Potassium: 4.2 mmol/L (ref 3.5–5.1)
Sodium: 137 mmol/L (ref 135–145)
Total Bilirubin: 1.2 mg/dL (ref 0.3–1.2)
Total Protein: 6.5 g/dL (ref 6.5–8.1)

## 2020-10-11 MED ORDER — METOPROLOL SUCCINATE ER 50 MG PO TB24
75.0000 mg | ORAL_TABLET | Freq: Every day | ORAL | Status: DC
  Administered 2020-10-11 – 2020-10-13 (×3): 75 mg via ORAL
  Filled 2020-10-11 (×3): qty 1

## 2020-10-11 MED ORDER — FUROSEMIDE 40 MG PO TABS: 40.0000 mg | ORAL_TABLET | Freq: Every day | ORAL | Status: DC

## 2020-10-11 MED ORDER — DAPAGLIFLOZIN PROPANEDIOL 10 MG PO TABS
10.0000 mg | ORAL_TABLET | Freq: Every day | ORAL | Status: DC
  Administered 2020-10-11 – 2020-10-13 (×3): 10 mg via ORAL
  Filled 2020-10-11 (×3): qty 1

## 2020-10-11 MED ORDER — AMIODARONE HCL 100 MG PO TABS
100.0000 mg | ORAL_TABLET | Freq: Once | ORAL | Status: AC
  Administered 2020-10-11: 100 mg via ORAL
  Filled 2020-10-11: qty 1

## 2020-10-11 MED ORDER — AMIODARONE HCL 200 MG PO TABS
200.0000 mg | ORAL_TABLET | Freq: Every day | ORAL | Status: DC
  Administered 2020-10-12 – 2020-10-13 (×2): 200 mg via ORAL
  Filled 2020-10-11 (×2): qty 1

## 2020-10-11 MED ORDER — SPIRONOLACTONE 12.5 MG HALF TABLET
12.5000 mg | ORAL_TABLET | Freq: Every day | ORAL | Status: DC
  Administered 2020-10-11: 12.5 mg via ORAL
  Filled 2020-10-11: qty 1

## 2020-10-11 MED FILL — Nitroglycerin IV Soln 100 MCG/ML in D5W: INTRA_ARTERIAL | Qty: 10 | Status: AC

## 2020-10-11 NOTE — Progress Notes (Signed)
CARDIAC REHAB PHASE I   PRE:  Rate/Rhythm: 88-104 afib  BP:  Supine:   Sitting: 114/86  Standing:    SaO2: 96%RA  MODE:  Ambulation: 370 ft   POST:  Rate/Rhythm: 133 afib  BP:  Supine:   Sitting: 119/63  Standing:    SaO2: 97%RA 1015-1115 Pt walked 370 ft on RA with asst x 1. Had pt stop twice to rest when HR approaching 130. Denied CP. To bathroom after walk and then to sitting on side of bed. Gave pt MI booklet and discussed restrictions, NTG use, importance of plavix with stent, walking for ex, and CRP 2. Referral to [place] made. Encouraged pt to stop smoking and gave handout. Encouraged to call [phone] if needed. Pt getting sleepy by end of ed. Encouraged him to look over later. Also gave heart healthy diet and discussed some healthy food choices. Knows he needs to watch salt in diet also.    Graylon Good, RN BSN  10/11/2020 11:13 AM

## 2020-10-11 NOTE — Progress Notes (Signed)
 Progress Note  Patient Name: [name] Date of Encounter: 10/11/2020  [facility] Cardiologist: Gregg Taylor  Subjective   Had stent performed yesterday.  Has severe lower back pain but made it through the procedure performed by Dr. Berry.  He wants to go home.  He denies angina this morning.  He is not short of breath.  He is on IV amiodarone.  Inpatient Medications    Scheduled Meds: . amiodarone  100 mg Oral Daily  . apixaban  5 mg Oral BID  . aspirin  81 mg Oral Daily  . atorvastatin  80 mg Oral Daily  . buprenorphine-naloxone  2 tablet Sublingual Daily  . Chlorhexidine Gluconate Cloth  6 each Topical Daily  . clopidogrel  75 mg Oral Q breakfast  . furosemide  40 mg Oral BID  . lidocaine  1 patch Transdermal Q24H  . metoprolol succinate  75 mg Oral Daily  . ramelteon  8 mg Oral QHS  . roflumilast  500 mcg Oral Daily  . sodium chloride flush  3 mL Intravenous Q12H   Continuous Infusions: . sodium chloride 10 mL/hr at 10/10/20 1400  . sodium chloride    . amiodarone 30 mg/hr (10/11/20 0500)   PRN Meds: sodium chloride, acetaminophen **OR** acetaminophen, acetaminophen, albuterol, methocarbamol, morphine injection, nitroGLYCERIN, sodium chloride flush   Vital Signs    Vitals:   10/11/20 0332 10/11/20 0400 10/11/20 0500 10/11/20 0727  BP:  110/71 101/66   Pulse:  96    Resp:  (!) 23 (!) 21   Temp:    (!) 97 F (36.1 C)  TempSrc:    Axillary  SpO2:  94%    Weight: 94.1 kg     Height:        Intake/Output Summary (Last 24 hours) at 10/11/2020 0940 Last data filed at 10/11/2020 0500 Gross per 24 hour  Intake 1120.6 ml  Output 1000 ml  Net 120.6 ml   Last 3 Weights 10/11/2020 10/10/2020 10/09/2020  Weight (lbs) 207 lb 7.3 oz 206 lb 6.4 oz 209 lb 11.2 oz  Weight (kg) 94.1 kg 93.622 kg 95.119 kg      Telemetry    A. fib with poor rate control.- Personally Reviewed  ECG    The most recent EKG from 10/11/2020 revealed normal sinus rhythm  abnormality, and LVH.- Personally Reviewed  Physical Exam  Obese GEN: No acute distress.   Neck: No JVD Cardiac: II with rapid rate, no murmurs, rubs, or gallops.  Respiratory: Clear to auscultation bilaterally. GI: Soft, nontender, non-distended  MS: No edema; No deformity. Neuro:  Nonfocal  Psych: Normal affect   Labs    High Sensitivity Troponin:   Recent Labs  Lab 10/08/20 0825 10/08/20 1131 10/08/20 1644 10/08/20 2020 10/09/20 0810  TROPONINIHS 54* 1,637* 9,404* 11,331* 5,587*      Chemistry Recent Labs  Lab 10/08/20 0825 10/08/20 0853 10/09/20 0301 10/09/20 1734 10/09/20 1735 10/10/20 0300 10/11/20 0320  NA 140   < > 137   < > 144 137 137  K 3.4*   < > 3.8   < > 4.0 4.0 4.2  CL 108   < > 106  --   --  106 105  CO2 20*  --  23  --   --  23 24  GLUCOSE 170*   < > 114*  --   --  88 120*  BUN 17   < > 24*  --   --  19 19    CREATININE 1.44*   < > 1.32*  --   --  1.42* 1.42*  CALCIUM 8.4*  --  8.2*  --   --  8.3* 8.2*  PROT 7.3  --   --   --   --  7.2 6.5  ALBUMIN 3.1*  --   --   --   --  3.1* 2.9*  AST 18  --   --   --   --  31 44*  ALT 13  --   --   --   --  15 16  ALKPHOS 93  --   --   --   --  77 71  BILITOT 0.7  --   --   --   --  0.8 1.2  GFRNONAA 51*  --  57*  --   --  52* 52*  ANIONGAP 12  --  8  --   --  8 8   < > = values in this interval not displayed.     Hematology Recent Labs  Lab 10/09/20 0810 10/09/20 1734 10/09/20 1735 10/10/20 0300 10/11/20 0320  WBC 10.3  --   --  11.5* 9.9  RBC 4.99  --   --  4.97 4.75  HGB 15.6   < > 14.3 15.4 14.8  HCT 47.2   < > 42.0 47.5 45.2  MCV 94.6  --   --  95.6 95.2  MCH 31.3  --   --  31.0 31.2  MCHC 33.1  --   --  32.4 32.7  RDW 13.6  --   --  13.6 13.7  PLT 232  --   --  225 226   < > = values in this interval not displayed.    BNP Recent Labs  Lab 10/08/20 0825  BNP 250.2*     DDimer No results for input(s): DDIMER in the last 168 hours.   Radiology    CARDIAC  CATHETERIZATION  Result Date: 10/10/2020  Prox LAD to Mid LAD lesion is 95% stenosed.  A drug-eluting stent was successfully placed using a STENT RESOLUTE ONYX 3.5X22.  Post intervention, there is a 0% residual stenosis.  [name] is a 75 y.o. male  [id number] LOCATION:  FACILITY: [facility] PHYSICIAN: Jonathan Berry, M.D. 11/01/1945 DATE OF PROCEDURE:  10/10/2020 DATE OF DISCHARGE: CARDIAC CATHETERIZATION / PCI/ DES LAD History obtained from chart review.75 y.o. male with a PMH of chronic combined CHF/NICM, atrial flutter s/p ablation,paroxysmalatrial fibrillation oneliquis, HTN, COPD, polycythemia vera, CKD stage 3, polysubstance abuse (ETOH, heroin, cocaine) on suboxone,and ascending aortic aneurysm whowas seen forevaluation ofacute on chronic SOB and elevated HsTropat the request ofDr. Narendra.  The patient had positive enzymes consistent with non-STEMI.  His EF was 45% with moderate to severe MR.  He underwent right and left heart cath yesterday by Dr. Mcalhany revealing high-grade proximal LAD disease and high filling pressures and "V wave".  The intent was to perform PCI drug-eluting stenting however the patient unfortunately was not able to wait any longer for the procedure and decided not to pursue this at that time.  He presents now for staged LAD intervention. PROCEDURE DESCRIPTION: The patient was brought to the second floor [facility] cath lab in the postabsorptive state. He was premedicated with IV Versed and fentanyl. His right groinwas prepped and shaved in usual sterile fashion. Xylocaine 1% was used for local anesthesia. A 6 long 6 French sheath was inserted into the right common femoral artery using   standard Seldinger technique.  Ultrasound was used to identify the right common femoral artery which was fairly deep and guide access.  A digital image was captured and placed in the patient's chart.  The patient was already on aspirin and Plavix. The patient received 10  calciums of heparin with an ACT of 440.  Using a 6 [place] XB LAD 4 cm guide catheter (patient had a thoracic aortic aneurysm) along with a 0.14/190 cm long prowater guidewire and a 2 mm x 12 mm balloon the proximal LAD lesion was crossed with mild difficulty and predilated.  Following this a 3.5 mm x 22 mm long Medtronic Onyx resolute drug-eluting stent was then carefully positioned across the lesion and deployed at 16 atm.  It was postdilated with a 4 mm x 15 mm long noncompliant balloon at 16 atm (4.1 mm) resulting reduction of a 95% ulcerated appearing proximal LAD lesion to 0% residual.  I did lose the small first diagonal branch during stent implantation.   Successful proximal LAD PCI drug-eluting stenting using a Medtronic Onyx drug-eluting stent postdilated to 4.1 mm.  The patient was somewhat agitated at the end of the procedure.  I did use a long sheath because of iliac tortuosity.  He was already on aspirin Plavix.  I have elected to place the patient oriented to age for posterior groin management.  The long sheath was exchanged over an 035 wire for short 6 French sheath which was then secured in place.  The patient left lab in stable condition. [name]. MD, [facility] 10/10/2020 3:37 PM   CARDIAC CATHETERIZATION  Result Date: 10/09/2020  Prox LAD lesion is 95% stenosed.  1. Severe stenosis proximal to mid LAD 2. No obstructive disease in the RCA or Circumflex atery 3. Elevated right and left heart pressures.    Cardiac Studies  PCI LAD 10/10/2020:  Diagnostic Dominance: Right    Intervention      2D Doppler echocardiogram 10/09/2020: IMPRESSIONS    1. Left ventricular ejection fraction, by estimation, is 40 to 45%. The  left ventricle has mildly decreased function. The left ventricle  demonstrates global hypokinesis. The left ventricular internal cavity size  was mildly dilated. There is mild left  ventricular hypertrophy. Left ventricular diastolic parameters are   indeterminate.  2. Right ventricular systolic function is normal. The right ventricular  size is normal. There is mildly elevated pulmonary artery systolic  pressure. The estimated right ventricular systolic pressure is 32.2 mmHg.  3. The mitral valve is normal in structure. Moderate to severe mitral  valve regurgitation. Central MR jet, appears functional.  4. Tricuspid valve regurgitation is mild to moderate.  5. The aortic valve is tricuspid. Aortic valve regurgitation is moderate.  Mild to moderate aortic valve sclerosis/calcification is present, without  any evidence of aortic stenosis.  6. Aortic dilatation noted. Aneurysm of the ascending aorta, measuring 49  mm.  7. 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   Patient Profile     75 y.o. male with a PMH of chronic combined CHF/NICM, atrial flutter s/p ablation, now with paroxysmal atrial fibrillation on eliquis, HTN, COPD, polycythemia vera, CKD stage 3, polysubstance abuse (ETOH, heroin, cocaine) on suboxone, who is being seen today for the evaluation of acute on chronic SOB and elevated HsTrop. Cath showed severe prox LAD-> DES 10/11/2020  Assessment & Plan    1. Non-ST elevation myocardial infarction with high-grade LAD disease: Successful  stent implantation.  Plan Plavix therapy along with DOAC.  Watch for bleeding. 2. Paroxysmal atrial fibrillation: Converted to oral amiodarone.  Increase beta-blocker dose.  EP consultation to consider ablation. 3. Acute on chronic combined systolic and diastolic heart failure: 4 pillar therapy for systolic heart failure needs to be started.  Currently on metoprolol which is being uptitrated for rate control of A. fib.  Will institute low-dose Entresto and monitor kidney function and potassium closely.  Will decrease furosemide dose to 40 mg/day. 4. Stage IIIa CKD: Will monitor response on Entresto.  SGLT2  therapy will need to be added at some point. 5. Chronic anticoagulation: Eliquis, which will be used in conjunction with Plavix for recent stent.  He is better.  Will transfer to floor.  Will DC IV amiodarone and continue oral 200 mg daily.  Increase Toprol-XL to 75 mg/day.  Add dapagliflozin 10 mg/day.  Decrease Lasix to 40 mg once per day.  Has been on 40 mg twice daily as OP so we will need to watch for volume reexpansion.  Add spironolactone 12.5 mg daily when kidney function is stable, will need to add Entresto.      For questions or updates, please contact [place] Please consult [email or web address] for contact info under        Signed, Sinclair Grooms, MD  10/11/2020, 9:40 AM

## 2020-10-11 NOTE — Plan of Care (Signed)
Pt vss. No acute distress noted. No complaints at this time.    Problem: Education: Goal: Knowledge of General Education information will improve Description: Including pain rating scale, medication(s)/side effects and non-pharmacologic comfort measures Outcome: Progressing   Problem: Health Behavior/Discharge Planning: Goal: Ability to manage health-related needs will improve Outcome: Progressing   Problem: Clinical Measurements: Goal: Ability to maintain clinical measurements within normal limits will improve Outcome: Progressing Goal: Will remain free from infection Outcome: Progressing Goal: Diagnostic test results will improve Outcome: Progressing Goal: Respiratory complications will improve Outcome: Progressing Goal: Cardiovascular complication will be avoided Outcome: Progressing   Problem: Activity: Goal: Risk for activity intolerance will decrease Outcome: Progressing   Problem: Nutrition: Goal: Adequate nutrition will be maintained Outcome: Progressing   Problem: Coping: Goal: Level of anxiety will decrease Outcome: Progressing   Problem: Elimination: Goal: Will not experience complications related to bowel motility Outcome: Progressing Goal: Will not experience complications related to urinary retention Outcome: Progressing   Problem: Pain Managment: Goal: General experience of comfort will improve Outcome: Progressing   Problem: Safety: Goal: Ability to remain free from injury will improve Outcome: Progressing   Problem: Skin Integrity: Goal: Risk for impaired skin integrity will decrease Outcome: Progressing   Problem: Education: Goal: Understanding of CV disease, CV risk reduction, and recovery process will improve Outcome: Progressing Goal: Individualized Educational Video(s) Outcome: Progressing   Problem: Activity: Goal: Ability to return to baseline activity level will improve Outcome: Progressing   Problem: Cardiovascular: Goal:  Ability to achieve and maintain adequate cardiovascular perfusion will improve Outcome: Progressing Goal: Vascular access site(s) Level 0-1 will be maintained Outcome: Progressing   Problem: Health Behavior/Discharge Planning: Goal: Ability to safely manage health-related needs after discharge will improve Outcome: Progressing

## 2020-10-11 NOTE — Progress Notes (Signed)
Report called to 6E nurse.

## 2020-10-11 NOTE — Progress Notes (Signed)
1501 Applied warm compress to left lower forearm.

## 2020-10-11 NOTE — Progress Notes (Addendum)
Heart Failure Stewardship Pharmacist Progress Note   PCP: Marianna Payment, MD PCP-Cardiologist: Sinclair Grooms, MD    HPI:  75 yo M with PMH of CHF, atrial flutter/fibrillation, HTN, COPD, polycythemia vera, CKD III, polysubstance abuse, and ascending aortic aneurysm. He presented to the ED on 10/08/20 with shortness of breath, sweating, and non productive cough. He was found to be in afib RVR and with elevated troponin. He was taken for [facility] on 10/09/20 and found to have severe stenosis prox-midLAD and elevated filling pressures (RA 11, PCWP 26, CO 3.85, CI 1.86). Elective PCI on 10/10/20 was completed for LAD lesion. An ECHO was done on 10/09/20 and LVEF was 40-45%.  Current HF Medications: Furosemide 40 mg PO BID Metoprolol XL 50 mg daily  Prior to admission HF Medications: Furosemide 40 mg BID  Pertinent Lab Values: . Serum creatinine 1.42, BUN 19, Potassium 4.2, Sodium 137, BNP 250.2, Magnesium 1.9   Vital Signs: . Weight: 207 lbs (admission weight: 209 lbs) . Blood pressure: 100/60s . Heart rate: 90-110s  Medication Assistance / Insurance Benefits Check: Does the patient have prescription insurance?  Yes Type of insurance plan: [place] [employer]  Outpatient Pharmacy:  Prior to admission outpatient pharmacy: [facility] Is the patient willing to use [facility] at discharge? Yes Is the patient willing to transition their outpatient pharmacy to utilize a [facility] outpatient pharmacy?   Pending    Assessment: 1. Acute on chronic systolic CHF (EF 50-93%), due to ICM. NYHA class II symptoms. - Continue furosemide 40 mg PO BID - Continue metoprolol XL 50 mg daily - Consider starting Entresto and/or spironolactone prior to discharge if BP allows - Consider starting Farxiga 10 mg daily prior to discharge (BP neutral)   Plan: 1) Medication changes recommended at this time: - Start Farxiga 10 mg daily  2) Patient assistance: - Entresto copay $4 per month (with primary  insurance) - Farxiga/Jardiance copay $4 per month (with primary insurance) - Patient appears to also have Medicaid - copays would lower to $0-$3 per month - Appreciate assistance from Willette Alma, CPhT  3)  Education  - Patient has been educated on current HF medications and potential additions to HF medication regimen  - Patient verbalizes understanding that over the next few months, these medication doses may change and more medications may be added to optimize HF regimen - Patient has been educated on basic disease state pathophysiology and goals of therapy   Kerby Nora, PharmD, BCPS Heart Failure Stewardship Pharmacist Phone [phone]

## 2020-10-11 NOTE — Progress Notes (Addendum)
Subjective:   [name] states that he feels much better after his stent was placed yesterday and denies any current symptoms, including SOB, CP, palpitations, light-headedness, or dizziness. He has a good appetite and is wondering when he may be okay to go home.   Objective:  Vital signs in last 24 hours: Vitals:   10/11/20 0300 10/11/20 0332 10/11/20 0400 10/11/20 0500  BP: 102/79  110/71 101/66  Pulse: 97  96   Resp: 16  (!) 23 (!) 21  Temp:      TempSrc:      SpO2: 94%  94%   Weight:  94.1 kg    Height:       General: Patient is obese. No acute distress. HENT: MMM. No nasal discharge. Respiratory: Patient is mildly tachypneic although with normal work of breathing, with clear lungs bilaterally on auscultation without wheezes, rales or rhonchi. On R/A. Cardiovascular: Rate is intermittently tachycardic to the 110's. Rhythm is irregularly irregular. There is trace bilateral pitting edema. LE's are warm to the touch. Neurological: Alert and oriented x 3. Psych: Normal affect. Normal tone of voice.   Assessment/Plan:  Active Problems:   Atrial fibrillation with RVR (HCC)   Non-ST elevation (NSTEMI) myocardial infarction (HCC)  Severe Stenosis Proximal to mid LAD  Patient received a stent to the LAD yesterday with 0% residual stenosis. A-Fib has persisted with mild tachycardia, mild tachypnea, and slightly soft but stable pressures. He feels much better without any symptoms.  - Cardiology following, appreciate their recommendations - Continue triple therapy (Eliquis 5 BID, ASA 81mg , Plavix 75mg ) daily for 1 month, then stop ASA - Continue Lipitor 80mg  daily - Monitor for bleeding   Persistent A-Fib Patient remains in A-Fib today with rates up to 110's. Denies symptoms.  - Cardiology following, appreciate their recommendations  - Transitioned to PO amiodarone  - Increase Toprol XL to 75mg  daily  - Continue triple therapy as above  - EP has been consulted for  consideration of ablation   Acute on Chronic Combined CHF (EF 40-45%)  Limited ECHO here showed moderately reduced EF of 40-45% with global hypokinesis, mildly elevated PA systolic pressures, ~moderate MR, TR and AR, with dilated IVC and aortic aneurysm of 66mm. Acute reduction in EF is likely 2/2 ischemia and A-Fib with RVR. CXR did show pulmonary edema with R > L infiltrates. His tachypnea is improving and he is saturating well on room air now, without increased work of breathing and only trace LE edema. Home dose of Lasix was 40mg  twice daily  - Cardiology following, appreciate their recommendations  - Decrease Lasix to 40mg  once daily  - Start Farxiga 10mg  daily - Start spironolactone 12.5mg  daily   - Plan to add Entresto low dose tomorrow if BP allows  - Strict I&O  - Daily weights   Hypertension: Blood pressures stable > 403 systolic.  - Continue Toprol XL 75mg  daily  - Continue spironolactone 12.5mg  daily  - Continue to monitor  - Plan to start on antihypertensives (GDMT) tomorrow if pressures allow   CKD 3a, Stable Creatinine remains stable since admission overall.  - Continue to monitor daily renal function   Elevated AST This is new since admission. May be in setting of cath yesterday.  - Continue to monitor daily CMP  Back Pain  Improved today with Lidocaine patch.  - Continue Lidocaine patch daily  - Continue Robaxin PRN  - Discontinue Morphine   Hypokalemia, Resolved  Elevated Hemoglobin (Hx PCV), Resolved Pre-Diabetes -  sugars overall remain stable  OUD - continue on home suboxone   Prior to Admission Living Arrangement: Home  Anticipated Discharge Location: Pending PT/OT evaluation Barriers to Discharge: Continued medical evaluation  Jeralyn Bennett, MD 10/11/2020, 7:18 AM Pager: [phone] After 5pm on weekdays and 1pm on weekends: On Call pager [phone]

## 2020-10-11 NOTE — Progress Notes (Signed)
Received report from [place], [place] on pt. [name] Kitchen

## 2020-10-11 NOTE — Progress Notes (Signed)
Notified Dr. Konrad Penta of Redness, hardness, and tenderness around IV insertion site.  Removed IV instructed to apply warm compress.

## 2020-10-12 LAB — COMPREHENSIVE METABOLIC PANEL
ALT: 19 U/L (ref 0–44)
AST: 40 U/L (ref 15–41)
Albumin: 3 g/dL — ABNORMAL LOW (ref 3.5–5.0)
Alkaline Phosphatase: 77 U/L (ref 38–126)
Anion gap: 9 (ref 5–15)
BUN: 25 mg/dL — ABNORMAL HIGH (ref 8–23)
CO2: 23 mmol/L (ref 22–32)
Calcium: 8.4 mg/dL — ABNORMAL LOW (ref 8.9–10.3)
Chloride: 104 mmol/L (ref 98–111)
Creatinine, Ser: 1.84 mg/dL — ABNORMAL HIGH (ref 0.61–1.24)
GFR, Estimated: 38 mL/min — ABNORMAL LOW (ref >60)
Glucose, Bld: 122 mg/dL — ABNORMAL HIGH (ref 70–99)
Potassium: 4 mmol/L (ref 3.5–5.1)
Sodium: 136 mmol/L (ref 135–145)
Total Bilirubin: 0.7 mg/dL (ref 0.3–1.2)
Total Protein: 6.8 g/dL (ref 6.5–8.1)

## 2020-10-12 LAB — CBC
HCT: 43.9 % (ref 39.0–52.0)
Hemoglobin: 14.6 g/dL (ref 13.0–17.0)
MCH: 31.3 pg (ref 26.0–34.0)
MCHC: 33.3 g/dL (ref 30.0–36.0)
MCV: 94 fL (ref 80.0–100.0)
Platelets: 201 10*3/uL (ref 150–400)
RBC: 4.67 MIL/uL (ref 4.22–5.81)
RDW: 14 % (ref 11.5–15.5)
WBC: 11.2 10*3/uL — ABNORMAL HIGH (ref 4.0–10.5)
nRBC: 0 % (ref 0.0–0.2)

## 2020-10-12 LAB — MAGNESIUM: Magnesium: 1.8 mg/dL (ref 1.7–2.4)

## 2020-10-12 MED ORDER — MAGNESIUM SULFATE 2 GM/50ML IV SOLN
2.0000 g | Freq: Once | INTRAVENOUS | Status: AC
  Administered 2020-10-12: 2 g via INTRAVENOUS
  Filled 2020-10-12: qty 50

## 2020-10-12 NOTE — Evaluation (Signed)
Physical Therapy Evaluation Patient Details Name: [name] MRN: [id number] DOB: 01/27/1946 Today's Date: 10/12/2020   History of Present Illness  75 y.o. male presenting to [facility] ED on 10/08/2020 with SOB, diaphoresis, cough, and chest pressure. Pt found to be hypertensive, hypoxic, and in Afib with RVR in ED. Pt underwent RHC on 10/09/2020 and LHC with stent placement on 10/10/2020.  Clinical Impression  Pt presents to PT with no significant mobility deficits at this time. Pt reports ambulating household distances independently at home and is able to do so currently. Pt doe become tachycardic with ambulation and stair negotiation, however he reports the distances ambulated this session are greater than his typical, and he recovers well with brief seated rest break. Pt is encouraged to ambulate at least 3 times daily out of the room for the remainder of this admission. Pt has no further acute PT needs. Acute PT signing off.    Follow Up Recommendations No PT follow up    Equipment Recommendations  None recommended by PT    Recommendations for Other Services       Precautions / Restrictions Precautions Precautions: None Restrictions Weight Bearing Restrictions: No      Mobility  Bed Mobility               General bed mobility comments: not assessed, pt received and left sitting at the edge of bed    Transfers Overall transfer level: Independent Equipment used: None                Ambulation/Gait Ambulation/Gait assistance: Independent Gait Distance (Feet): 250 Feet Assistive device: None Gait Pattern/deviations: Step-through pattern Gait velocity: functional Gait velocity interpretation: 1.31 - 2.62 ft/sec, indicative of limited community ambulator General Gait Details: pt with steady step-through gait  Stairs Stairs: Yes Stairs assistance: Supervision Stair Management: One rail Left;Step to pattern Number of Stairs: 3    Wheelchair  Mobility    Modified Rankin (Stroke Patients Only)       Balance Overall balance assessment: Mild deficits observed, not formally tested (only with stair negotiation, mitigated with use of railing)                                           Pertinent Vitals/Pain Pain Assessment: No/denies pain    Home Living Family/patient expects to be discharged to:: Private residence Living Arrangements: Spouse/significant other Available Help at Discharge: Family;Available 24 hours/day Type of Home: House Home Access: Stairs to enter Entrance Stairs-Rails: Can reach both Entrance Stairs-Number of Steps: 3 Home Layout: One level Home Equipment: Cane - single point      Prior Function Level of Independence: Independent         Comments: pt denies DME use, reports ambulating for household distances only     Hand Dominance        Extremity/Trunk Assessment   Upper Extremity Assessment Upper Extremity Assessment: Overall WFL for tasks assessed    Lower Extremity Assessment Lower Extremity Assessment: Overall WFL for tasks assessed    Cervical / Trunk Assessment Cervical / Trunk Assessment: Normal  Communication   Communication: No difficulties  Cognition Arousal/Alertness: Awake/alert Behavior During Therapy: WFL for tasks assessed/performed Overall Cognitive Status: Within Functional Limits for tasks assessed  General Comments General comments (skin integrity, edema, etc.): pt tachy to 134 with ambulation and stair negotiation, HR recovers quickly with seated rest break    Exercises     Assessment/Plan    PT Assessment Patent does not need any further PT services  PT Problem List         PT Treatment Interventions      PT Goals (Current goals can be found in the Care Plan section)       Frequency     Barriers to discharge        Co-evaluation               AM-PAC PT "6  Clicks" Mobility  Outcome Measure Help needed turning from your back to your side while in a flat bed without using bedrails?: None Help needed moving from lying on your back to sitting on the side of a flat bed without using bedrails?: None Help needed moving to and from a bed to a chair (including a wheelchair)?: None Help needed standing up from a chair using your arms (e.g., wheelchair or bedside chair)?: None Help needed to walk in hospital room?: None Help needed climbing 3-5 steps with a railing? : A Little 6 Click Score: 23    End of Session   Activity Tolerance: Patient tolerated treatment well Patient left: in bed;with call bell/phone within reach Nurse Communication: Mobility status      Time: 1038-1050 PT Time Calculation (min) (ACUTE ONLY): 12 min   Charges:   PT Evaluation $PT Eval Low Complexity: [place], PT, DPT Acute Rehabilitation Pager: [phone]   Zenaida Niece 10/12/2020, 11:30 AM

## 2020-10-12 NOTE — Progress Notes (Signed)
Subjective:   [name] states he continues to feel very well. He denies any CP, SOB, says his cough has resolved, denies LE swelling, urinary or bowel symptoms. Denies any light-headedness of dizziness with exertion and denies feeling palpitations.   Objective:  Vital signs in last 24 hours: Vitals:   10/12/20 0245 10/12/20 0815 10/12/20 0822 10/12/20 0900  BP: 103/60 111/74 111/74 101/61  Pulse: (!) 109 94 93   Resp:  18  18  Temp: 98.5 F (36.9 C) 98.6 F (37 C)  98.2 F (36.8 C)  TempSrc: Oral Oral  Oral  SpO2: 94%     Weight: 95.2 kg     Height:       General: Patient is obese. No acute distress. HENT: MMM. No nasal discharge. Respiratory: Tachypnea has resolved. Work of breathing and saturations are normal on room air. Lungs are CTA, bilaterally without wheezes, rales, or rhonchi.  Cardiovascular: Rate is in the 90's. Rhythm is irregularly irregular. There is trace bilateral LE pitting edema.  Neurological: Alert and oriented x 3. Psych: Normal affect. Normal tone of voice.   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 Denies any complaints at this time.  - Cardiology following, appreciate their recommendations - Per cardiology, would be stable for discharge tomorrow if renal function is stable  - Continue triple therapy (Eliquis 5 BID, ASA 81mg , Plavix 75mg ) daily until 11/09/20 (30 days), then stop ASA - Continue Lipitor 80mg  daily  Atrial Fibrillation, Persistent  Patient continues to remain in atrial fibrillation, although rates improved to the 80-90's overnight after amiodarone was transitioned to PO and Toprol was increased yesterday.  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 Acute reduction in EF is likely 2/2 ischemia vs. A-fib. Initial CXR did show pulmonary edema with R > L infiltrates. His tachypnea has resolved and he is now breathing very comfortably without symptoms on room air; however, he remained borderline hypotensive overnight with continued soft pressures. Home dose of Lasix was 40mg  twice daily.  - Cardiology following, appreciate their recommendations  - Will hold Lasix today - Discontinue spironolactone for now  - Continue Farxiga 10mg  daily - Plan to resume Lasix 40mg  daily tomorrow if creatinine is stable  - Plan to add low dose ARB once renal function is stable  - Do not anticipate he will be able to tolerate Entresto given soft pressures  - Consider addition of spironolactone in the future if pressures allow  - Strict I&O  - Daily weights   Hypotension Hx of HTN  Patient developed hypotension yesterday and pressures were borderline hypotensive overnight.  - Discontinue spironolactone 12.5mg  daily  - Hold Lasix today  - Continue Toprol XL 75mg  daily  - Continue to monitor  - Plan to add low dose ARB if renal function remains stable and pressures allow   AKI on CKD 3a Creatinine increased from  1.41 yesterday to 1.84 today with decrease in GFR to 38. Likely due to contrast-induced nephropathy vs. ATN in setting of hypotension yesterday.  - Hold Lasix today  - Continue to monitor with anticipated changes as above  - Okay for discharge pending renal function stability   Leukocytosis  Patient has chronic myeloproliferative disorder. Remains stable.  - Continue to monitor daily CBC's - Would  benefit from outpatient follow up   Pre-Diabetes - sugars overall remain stable  OUD - continue on home suboxone  Elevated AST, Resolved  Hypokalemia, Resolved  Elevated Hemoglobin (Hx PCV), Resolved  Prior to Admission Living Arrangement: Home  Anticipated Discharge Location: Pending PT/OT evaluation Barriers to Discharge: Pending Renal Function Stability   Code Status: Full Code  DVT PPx: On Einar Gip, MD 10/12/2020, 11:07 AM Pager: [phone] After 5pm on weekdays and 1pm on weekends: On Call pager [phone]

## 2020-10-12 NOTE — Progress Notes (Signed)
Bedside report received. Team checks done.  Pt vss. No acute distress noted.no Family at bedside. Call bell with in reach. Bed alarm on. Will assume poc. See charted full assessments.

## 2020-10-12 NOTE — Evaluation (Signed)
Occupational Therapy Evaluation Patient Details Name: [name] MRN: [id number] DOB: March 04, 1946 Today's Date: 10/12/2020    History of Present Illness 75 y.o. male presenting to [facility] ED on 10/08/2020 with SOB, diaphoresis, cough, and chest pressure. Pt found to be hypertensive, hypoxic, and in Afib with RVR in ED. Pt underwent RHC on 10/09/2020 and LHC with stent placement on 10/10/2020.   Clinical Impression   Patient admitted for the diagnosis above.  Patient with no complaints of discomfort, no real deficits noted.  Patient presents ator near his baseline function for ADL, toileting and in room mobility.  No further acute OT needed, and no post acute OT identified.      Follow Up Recommendations  No OT follow up    Equipment Recommendations  None recommended by OT    Recommendations for Other Services       Precautions / Restrictions Precautions Precautions: None Restrictions Weight Bearing Restrictions: No      Mobility Bed Mobility Overal bed mobility: Independent             General bed mobility comments: not assessed, pt received and left sitting at the edge of bed Patient Response: Cooperative  Transfers Overall transfer level: Independent Equipment used: None                  Balance Overall balance assessment: No apparent balance deficits (not formally assessed)                                         ADL either performed or assessed with clinical judgement   ADL Overall ADL's : At baseline                                             Vision Patient Visual Report: No change from baseline                  Pertinent Vitals/Pain Pain Assessment: No/denies pain     Hand Dominance Right   Extremity/Trunk Assessment Upper Extremity Assessment Upper Extremity Assessment: Overall WFL for tasks assessed   Lower Extremity Assessment Lower Extremity Assessment: Defer to PT evaluation    Cervical / Trunk Assessment Cervical / Trunk Assessment: Normal   Communication Communication Communication: No difficulties   Cognition Arousal/Alertness: Awake/alert Behavior During Therapy: WFL for tasks assessed/performed Overall Cognitive Status: Within Functional Limits for tasks assessed                                     General Comments  pt tachy to 134 with ambulation and stair negotiation, HR recovers quickly with seated rest break    Exercises     Shoulder Instructions      Home Living Family/patient expects to be discharged to:: Private residence Living Arrangements: Spouse/significant other Available Help at Discharge: Family;Available 24 hours/day Type of Home: House Home Access: Stairs to enter [employer] of Steps: 3 Entrance Stairs-Rails: Can reach both Home Layout: One level     Bathroom Shower/Tub: Teacher, early years/pre: Standard     Home Equipment: Cane - single point          Prior Functioning/Environment Level of Independence: Independent  Comments: pt denies DME use, reports ambulating for household distances only        OT Problem List: Decreased activity tolerance      OT Treatment/Interventions:      OT Goals(Current goals can be found in the care plan section) Acute Rehab OT Goals Patient Stated Goal: Hoping to return home OT Goal Formulation: With patient Potential to Achieve Goals: Good  OT Frequency:     Barriers to D/C:  none noted          Co-evaluation              AM-PAC OT "6 Clicks" Daily Activity     Outcome Measure Help from another person eating meals?: None Help from another person taking care of personal grooming?: None Help from another person toileting, which includes using toliet, bedpan, or urinal?: None Help from another person bathing (including washing, rinsing, drying)?: None Help from another person to put on and taking off regular upper body  clothing?: None Help from another person to put on and taking off regular lower body clothing?: None 6 Click Score: 24   End of Session    Activity Tolerance: Patient tolerated treatment well Patient left: in bed  OT Visit Diagnosis: Pain                Time: 7741-4239 OT Time Calculation (min): 13 min Charges:  OT General Charges $OT Visit: 1 Visit OT Evaluation $OT Eval Moderate Complexity: 1 Mod  10/12/2020  Rich, OTR/L  Acute Rehabilitation Services  Office:  [phone]   Metta Clines 10/12/2020, 11:37 AM

## 2020-10-12 NOTE — Progress Notes (Signed)
CARDIAC REHAB PHASE I   PRE:  Rate/Rhythm: 89 afib  BP:  Supine:   Sitting: 106/65  Standing:    SaO2: 97%RA  MODE:  Ambulation: 175 ft   POST:  Rate/Rhythm: 117 afib  BP:  Supine:   Sitting: 118/83  Standing:    SaO2: 99%RA 1402-1420 Pt walked 175 ft on RA with asst x 1. Gait steady. Stopped three times to rest and catch his breath. Reinforced smoking cessation, weighing daily, watching sodium. Pt voiced understanding.   Graylon Good, RN BSN  10/12/2020 2:17 PM

## 2020-10-12 NOTE — Progress Notes (Addendum)
 Progress Note  Patient Name: [name] Date of Encounter: 10/12/2020  [facility] Cardiologist: Henry W Smith III, MD   Subjective   No CP or dyspnea  Inpatient Medications    Scheduled Meds: . amiodarone  200 mg Oral Daily  . apixaban  5 mg Oral BID  . aspirin  81 mg Oral Daily  . atorvastatin  80 mg Oral Daily  . buprenorphine-naloxone  2 tablet Sublingual Daily  . Chlorhexidine Gluconate Cloth  6 each Topical Daily  . clopidogrel  75 mg Oral Q breakfast  . dapagliflozin propanediol  10 mg Oral Daily  . furosemide  40 mg Oral Daily  . lidocaine  1 patch Transdermal Q24H  . metoprolol succinate  75 mg Oral Daily  . ramelteon  8 mg Oral QHS  . roflumilast  500 mcg Oral Daily  . sodium chloride flush  3 mL Intravenous Q12H  . spironolactone  12.5 mg Oral Daily   Continuous Infusions: . sodium chloride 10 mL/hr at 10/10/20 1400  . sodium chloride     PRN Meds: sodium chloride, acetaminophen **OR** acetaminophen, acetaminophen, albuterol, methocarbamol, nitroGLYCERIN, sodium chloride flush   Vital Signs    Vitals:   10/11/20 1618 10/11/20 1926 10/11/20 2330 10/12/20 0245  BP: 98/75 (!) 93/58 (!) 98/59 103/60  Pulse: 93 84 84 (!) 109  Resp: 20     Temp: 97.9 F (36.6 C) 98 F (36.7 C) 98.4 F (36.9 C) 98.5 F (36.9 C)  TempSrc: Oral Oral Oral Oral  SpO2: 95% 93% 95% 94%  Weight:    95.2 kg  Height:        Intake/Output Summary (Last 24 hours) at 10/12/2020 0624 Last data filed at 10/11/2020 1800 Gross per 24 hour  Intake 480 ml  Output 175 ml  Net 305 ml   Last 3 Weights 10/12/2020 10/11/2020 10/10/2020  Weight (lbs) 209 lb 12.8 oz 207 lb 7.3 oz 206 lb 6.4 oz  Weight (kg) 95.165 kg 94.1 kg 93.622 kg      Telemetry    Atrial fibrillation rate controlled- Personally Reviewed  Physical Exam   GEN: No acute distress.   Neck: No JVD Cardiac: irregular Respiratory: Clear to auscultation bilaterally. GI: Soft, nontender, non-distended,  right groin with no hematoma MS: No edema Neuro:  Nonfocal  Psych: Normal affect   Labs    High Sensitivity Troponin:   Recent Labs  Lab 10/08/20 0825 10/08/20 1131 10/08/20 1644 10/08/20 2020 10/09/20 0810  TROPONINIHS 54* 1,637* 9,404* 11,331* 5,587*      Chemistry Recent Labs  Lab 10/10/20 0300 10/11/20 0320 10/12/20 0102  NA 137 137 136  K 4.0 4.2 4.0  CL 106 105 104  CO2 23 24 23  GLUCOSE 88 120* 122*  BUN 19 19 25*  CREATININE 1.42* 1.42* 1.84*  CALCIUM 8.3* 8.2* 8.4*  PROT 7.2 6.5 6.8  ALBUMIN 3.1* 2.9* 3.0*  AST 31 44* 40  ALT 15 16 19  ALKPHOS 77 71 77  BILITOT 0.8 1.2 0.7  GFRNONAA 52* 52* 38*  ANIONGAP 8 8 9     Hematology Recent Labs  Lab 10/10/20 0300 10/11/20 0320 10/12/20 0102  WBC 11.5* 9.9 11.2*  RBC 4.97 4.75 4.67  HGB 15.4 14.8 14.6  HCT 47.5 45.2 43.9  MCV 95.6 95.2 94.0  MCH 31.0 31.2 31.3  MCHC 32.4 32.7 33.3  RDW 13.6 13.7 14.0  PLT 225 226 201    BNP Recent Labs  Lab 10/08/20 0825    BNP 250.2*      Radiology    CARDIAC CATHETERIZATION  Result Date: 10/10/2020  Prox LAD to Mid LAD lesion is 95% stenosed.  A drug-eluting stent was successfully placed using a STENT RESOLUTE ONYX 3.5X22.  Post intervention, there is a 0% residual stenosis.  [name] is a 74 y.o. male  [id number] LOCATION:  FACILITY: [facility] PHYSICIAN: Jonathan Berry, M.D. 12/15/1945 DATE OF PROCEDURE:  10/10/2020 DATE OF DISCHARGE: CARDIAC CATHETERIZATION / PCI/ DES LAD History obtained from chart review.74 y.o. male with a PMH of chronic combined CHF/NICM, atrial flutter s/p ablation,paroxysmalatrial fibrillation oneliquis, HTN, COPD, polycythemia vera, CKD stage 3, polysubstance abuse (ETOH, heroin, cocaine) on suboxone,and ascending aortic aneurysm whowas seen forevaluation ofacute on chronic SOB and elevated HsTropat the request ofDr. Narendra.  The patient had positive enzymes consistent with non-STEMI.  His EF was 45% with moderate  to severe MR.  He underwent right and left heart cath yesterday by Dr. Mcalhany revealing high-grade proximal LAD disease and high filling pressures and "V wave".  The intent was to perform PCI drug-eluting stenting however the patient unfortunately was not able to wait any longer for the procedure and decided not to pursue this at that time.  He presents now for staged LAD intervention. PROCEDURE DESCRIPTION: The patient was brought to the second floor [facility] cath lab in the postabsorptive state. He was premedicated with IV Versed and fentanyl. His right groinwas prepped and shaved in usual sterile fashion. Xylocaine 1% was used for local anesthesia. A 6 long 6 French sheath was inserted into the right common femoral artery using standard Seldinger technique.  Ultrasound was used to identify the right common femoral artery which was fairly deep and guide access.  A digital image was captured and placed in the patient's chart.  The patient was already on aspirin and Plavix. The patient received 10 calciums of heparin with an ACT of 440.  Using a 6 French XB LAD 4 cm guide catheter (patient had a thoracic aortic aneurysm) along with a 0.14/190 cm long prowater guidewire and a 2 mm x 12 mm balloon the proximal LAD lesion was crossed with mild difficulty and predilated.  Following this a 3.5 mm x 22 mm long Medtronic Onyx resolute drug-eluting stent was then carefully positioned across the lesion and deployed at 16 atm.  It was postdilated with a 4 mm x 15 mm long noncompliant balloon at 16 atm (4.1 mm) resulting reduction of a 95% ulcerated appearing proximal LAD lesion to 0% residual.  I did lose the small first diagonal branch during stent implantation.   Successful proximal LAD PCI drug-eluting stenting using a Medtronic Onyx drug-eluting stent postdilated to 4.1 mm.  The patient was somewhat agitated at the end of the procedure.  I did use a long sheath because of iliac tortuosity.  He was already on  aspirin Plavix.  I have elected to place the patient oriented to age for posterior groin management.  The long sheath was exchanged over an 035 wire for short 6 French sheath which was then secured in place.  The patient left lab in stable condition. Jonathan Berry. MD, FACC 10/10/2020 3:37 PM    Patient Profile     74 y.o. male with a PMH of chronic combined CHF/NICM, atrial flutter s/p ablation, now withparoxysmalatrial fibrillation oneliquis, HTN, COPD, polycythemia vera, CKD stage 3, polysubstance abuse (ETOH, heroin, cocaine) on suboxone,who is being seen for the evaluation ofacute on chronic SOB and elevated HsTrop.    Echocardiogram showed ejection fraction 40 to 45%, mild left ventricular hypertrophy, moderate to severe mitral regurgitation, mild to moderate tricuspid regurgitation, moderate aortic insufficiency, ascending aortic aneurysm measuring 49 mm.  Cardiac catheterization revealed 95% proximal LAD which was treated with drug-eluting stent.  Assessment & Plan    1 non-ST elevation myocardial infarction-patient is status post PCI of LAD.  We will continue aspirin and Plavix for 30 days.  Discontinue aspirin at that time and continue Plavix.  Continue statin.  Continue beta-blocker.  2 paroxysmal atrial fibrillation-patient is in atrial fibrillation this morning but is asymptomatic.  Blood pressure is borderline.  Continue amiodarone and beta-blocker.  Continue apixaban 5 mg twice daily.  Can consider referral to electrophysiology for consideration of ablation as an outpatient.  3 acute on chronic combined systolic/diastolic congestive heart failure-patient is euvolemic today.  Creatinine has increased slightly.  We will hold diuretics today and likely resume Lasix 40 mg daily tomorrow if renal function stable.  With reduced LV function we will continue Toprol at present dose.  Add low-dose ARB later once renal function stable and if blood pressure allows.  I do not think his blood  pressure will tolerate Entresto.  4 chronic stage IIIa kidney disease-creatinine slightly increased this morning compared to yesterday.  Possible contrast nephropathy.  Hold diuretic.  Recheck renal function tomorrow.  5 ascending thoracic aortic aneurysm-noted on echocardiogram.  This has been documented on previous CTA's.  Will need follow-up as an outpatient.  6 hypertension-blood pressure is borderline.  Discontinue spironolactone.  Follow blood pressure and adjust medications as needed.  7 hyperlipidemia-continue statin.  8 valvular heart disease with moderate to severe mitral regurgitation and moderate aortic insufficiency-we will need follow-up echoes as an outpatient.  Possible discharge tomorrow morning if stable pending renal function.  For questions or updates, please contact [facility] Please consult [email or web address] for contact info under        Signed, Kirk Ruths, MD  10/12/2020, 6:24 AM

## 2020-10-13 DIAGNOSIS — I712 Thoracic aortic aneurysm, without rupture: Secondary | ICD-10-CM

## 2020-10-13 DIAGNOSIS — E785 Hyperlipidemia, unspecified: Secondary | ICD-10-CM

## 2020-10-13 DIAGNOSIS — I130 Hypertensive heart and chronic kidney disease with heart failure and stage 1 through stage 4 chronic kidney disease, or unspecified chronic kidney disease: Secondary | ICD-10-CM

## 2020-10-13 DIAGNOSIS — R7303 Prediabetes: Secondary | ICD-10-CM

## 2020-10-13 LAB — RENAL FUNCTION PANEL
Albumin: 3 g/dL — ABNORMAL LOW (ref 3.5–5.0)
Anion gap: 8 (ref 5–15)
BUN: 25 mg/dL — ABNORMAL HIGH (ref 8–23)
CO2: 25 mmol/L (ref 22–32)
Calcium: 8.5 mg/dL — ABNORMAL LOW (ref 8.9–10.3)
Chloride: 104 mmol/L (ref 98–111)
Creatinine, Ser: 1.67 mg/dL — ABNORMAL HIGH (ref 0.61–1.24)
GFR, Estimated: 43 mL/min — ABNORMAL LOW (ref >60)
Glucose, Bld: 111 mg/dL — ABNORMAL HIGH (ref 70–99)
Phosphorus: 4 mg/dL (ref 2.5–4.6)
Potassium: 4.2 mmol/L (ref 3.5–5.1)
Sodium: 137 mmol/L (ref 135–145)

## 2020-10-13 LAB — CBC
HCT: 42.3 % (ref 39.0–52.0)
Hemoglobin: 13.9 g/dL (ref 13.0–17.0)
MCH: 31.2 pg (ref 26.0–34.0)
MCHC: 32.9 g/dL (ref 30.0–36.0)
MCV: 94.8 fL (ref 80.0–100.0)
Platelets: 223 10*3/uL (ref 150–400)
RBC: 4.46 MIL/uL (ref 4.22–5.81)
RDW: 14 % (ref 11.5–15.5)
WBC: 10.4 10*3/uL (ref 4.0–10.5)
nRBC: 0 % (ref 0.0–0.2)

## 2020-10-13 LAB — CULTURE, BLOOD (ROUTINE X 2)
Culture: NO GROWTH
Culture: NO GROWTH
Special Requests: ADEQUATE

## 2020-10-13 LAB — MAGNESIUM: Magnesium: 2.4 mg/dL (ref 1.7–2.4)

## 2020-10-13 MED ORDER — ASPIRIN EC 81 MG PO TBEC: 81.0000 mg | DELAYED_RELEASE_TABLET | Freq: Every day | ORAL | 0 refills | Status: DC

## 2020-10-13 MED ORDER — AMIODARONE HCL 200 MG PO TABS: 200.0000 mg | ORAL_TABLET | Freq: Every day | ORAL | 0 refills | Status: DC

## 2020-10-13 MED ORDER — DAPAGLIFLOZIN PROPANEDIOL 10 MG PO TABS: 10.0000 mg | ORAL_TABLET | Freq: Every day | ORAL | 0 refills | Status: DC

## 2020-10-13 MED ORDER — APIXABAN 5 MG PO TABS: 5.0000 mg | ORAL_TABLET | Freq: Two times a day (BID) | ORAL | 0 refills | Status: DC

## 2020-10-13 MED ORDER — ATORVASTATIN CALCIUM 80 MG PO TABS: 80.0000 mg | ORAL_TABLET | Freq: Every day | ORAL | 0 refills | Status: DC

## 2020-10-13 MED ORDER — CLOPIDOGREL BISULFATE 75 MG PO TABS: 75.0000 mg | ORAL_TABLET | Freq: Every day | ORAL | 0 refills | Status: DC

## 2020-10-13 MED ORDER — FUROSEMIDE 40 MG PO TABS: 40.0000 mg | ORAL_TABLET | Freq: Every day | ORAL | 0 refills | Status: DC

## 2020-10-13 MED ORDER — FUROSEMIDE 40 MG PO TABS
40.0000 mg | ORAL_TABLET | Freq: Every day | ORAL | Status: DC
  Administered 2020-10-13: 40 mg via ORAL
  Filled 2020-10-13: qty 1

## 2020-10-13 MED ORDER — METOPROLOL SUCCINATE ER 25 MG PO TB24: 75.0000 mg | ORAL_TABLET | Freq: Every day | ORAL | 0 refills | Status: DC

## 2020-10-13 NOTE — Discharge Instructions (Signed)
Atrial Fibrillation  Atrial fibrillation is a type of heartbeat that is irregular or fast. If you have this condition, your heart beats without any order. This makes it hard for your heart to pump blood in a normal way. Atrial fibrillation may come and go, or it may become a long-lasting problem. If this condition is not treated, it can put you at higher risk for stroke, heart failure, and other heart problems. What are the causes? This condition may be caused by diseases that damage the heart. They include:  High blood pressure.  Heart failure.  Heart valve disease.  Heart surgery. Other causes include:  Diabetes.  Thyroid disease.  Being overweight.  Kidney disease. Sometimes the cause is not known. What increases the risk? You are more likely to develop this condition if:  You are older.  You smoke.  You exercise often and very hard.  You have a family history of this condition.  You are a man.  You use drugs.  You drink a lot of alcohol.  You have lung conditions, such as emphysema, pneumonia, or COPD.  You have sleep apnea. What are the signs or symptoms? Common symptoms of this condition include:  A feeling that your heart is beating very fast.  Chest pain or discomfort.  Feeling short of breath.  Suddenly feeling light-headed or weak.  Getting tired easily during activity.  Fainting.  Sweating. In some cases, there are no symptoms. How is this treated? Treatment for this condition depends on underlying conditions and how you feel when you have atrial fibrillation. They include:  Medicines to: ? Prevent blood clots. ? Treat heart rate or heart rhythm problems.  Using devices, such as a pacemaker, to correct heart rhythm problems.  Doing surgery to remove the part of the heart that sends bad signals.  Closing an area where clots can form in the heart (left atrial appendage). In some cases, your doctor will treat other underlying  conditions. Follow these instructions at home: Medicines  Take over-the-counter and prescription medicines only as told by your doctor.  Do not take any new medicines without first talking to your doctor.  If you are taking blood thinners: ? Talk with your doctor before you take any medicines that have aspirin or NSAIDs, such as ibuprofen, in them. ? Take your medicine exactly as told by your doctor. Take it at the same time each day. ? Avoid activities that could hurt or bruise you. Follow instructions about how to prevent falls. ? Wear a bracelet that says you are taking blood thinners. Or, carry a card that lists what medicines you take. Lifestyle  Do not use any products that have nicotine or tobacco in them. These include cigarettes, e-cigarettes, and chewing tobacco. If you need help quitting, ask your doctor.  Eat heart-healthy foods. Talk with your doctor about the right eating plan for you.  Exercise regularly as told by your doctor.  Do not drink alcohol.  Lose weight if you are overweight.  Do not use drugs, including cannabis.      General instructions  If you have a condition that causes breathing to stop for a short period of time (apnea), treat it as told by your doctor.  Keep a healthy weight. Do not use diet pills unless your doctor says they are safe for you. Diet pills may make heart problems worse.  Keep all follow-up visits as told by your doctor. This is important. Contact a doctor if:  You notice  a change in the speed, rhythm, or strength of your heartbeat.  You are taking a blood-thinning medicine and you get more bruising.  You get tired more easily when you move or exercise.  You have a sudden change in weight. Get help right away if:  You have pain in your chest or your belly (abdomen).  You have trouble breathing.  You have side effects of blood thinners, such as blood in your vomit, poop (stool), or pee (urine), or bleeding that cannot  stop.  You have any signs of a stroke. "BE FAST" is an easy way to remember the main warning signs: ? B - Balance. Signs are dizziness, sudden trouble walking, or loss of balance. ? E - Eyes. Signs are trouble seeing or a change in how you see. ? F - Face. Signs are sudden weakness or loss of feeling in the face, or the face or eyelid drooping on one side. ? A - Arms. Signs are weakness or loss of feeling in an arm. This happens suddenly and usually on one side of the body. ? S - Speech. Signs are sudden trouble speaking, slurred speech, or trouble understanding what people say. ? T - Time. Time to call emergency services. Write down what time symptoms started.  You have other signs of a stroke, such as: ? A sudden, very bad headache with no known cause. ? Feeling like you may vomit (nausea). ? Vomiting. ? A seizure. These symptoms may be an emergency. Do not wait to see if the symptoms will go away. Get medical help right away. Call your local emergency services (911 in the [place] Do not drive yourself to the hospital.   Summary  Atrial fibrillation is a type of heartbeat that is irregular or fast.  You are at higher risk of this condition if you smoke, are older, have diabetes, or are overweight.  Follow your doctor's instructions about medicines, diet, exercise, and follow-up visits.  Get help right away if you have signs or symptoms of a stroke.  Get help right away if you cannot catch your breath, or you have chest pain or discomfort. This information is not intended to replace advice given to you by your health care provider. Make sure you discuss any questions you have with your health care provider. Document Revised: 12/28/2018 Document Reviewed: 12/28/2018 Elsevier Patient Education  [place]. Heart Attack A heart attack occurs when blood and oxygen supply to the heart is cut off. A heart attack causes damage to the heart that cannot be fixed. A heart attack is also  called a myocardial infarction, or MI. If you think you are having a heart attack, do not wait to see if the symptoms will go away. Get medical help right away. What are the causes? This condition may be caused by:  A fatty substance (plaque) in the blood vessels (arteries). This can block the flow of blood to the heart.  A blood clot in the blood vessels that go to the heart. The blood clot blocks blood flow.  Low blood pressure.  An abnormal heartbeat.  Some diseases, such as problems in red blood cells (anemia)orproblems in breathing (respiratory failure).  Tightening (spasm) of a blood vessel that cuts off blood to the heart.  A tear in a blood vessel of the heart.  High blood pressure.   What increases the risk? The following factors may make you more likely to develop this condition:  Aging. The older you are, the  higher your risk.  Having a personal or family history of chest pain, heart attack, stroke, or narrowing of the arteries in the legs, arms, head, or stomach (peripheral artery disease).  Being male.  Smoking.  Not getting regular exercise.  Being overweight or obese.  Having high blood pressure.  Having high cholesterol.  Having diabetes.  Drinking too much alcohol.  Using illegal drugs, such as cocaine or methamphetamine. What are the signs or symptoms? Symptoms of this condition include:  Chest pain. It may feel like: ? Crushing or squeezing. ? Tightness, pressure, fullness, or heaviness.  Pain in the arm, neck, jaw, back, or upper body.  Shortness of breath.  Heartburn.  Upset stomach (indigestion).  Feeling like you may vomit (nauseous).  Cold sweats.  Feeling tired.  Sudden light-headedness. How is this treated? A heart attack must be treated as soon as possible. Treatment may include:  Medicines to: ? Break up or dissolve blood clots. ? Thin blood and help prevent blood clots. ? Treat blood pressure. ? Improve blood flow to  the heart. ? Reduce pain. ? Reduce cholesterol.  Procedures to widen a blocked artery and keep it open.  Open heart surgery.  Receiving oxygen.  Making your heart strong again (cardiac rehabilitation) through exercise, education, and counseling.   Follow these instructions at home: Medicines  Take over-the-counter and prescription medicines only as told by your doctor. You may need to take medicine: ? To keep your blood from clotting too easily. ? To control blood pressure. ? To lower cholesterol. ? To control heart rhythms.  Do not take these medicines unless your doctor says it is okay: ? NSAIDs, such as ibuprofen. ? Supplements that have vitamin A, vitamin E, or both. ? Hormone replacement therapy that has estrogen with or without progestin. Lifestyle  Do not use any products that have nicotine or tobacco, such as cigarettes, e-cigarettes, and chewing tobacco. If you need help quitting, ask your doctor.  Avoid secondhand smoke.  Exercise regularly. Ask your doctor about a cardiac rehab program.  Eat heart-healthy foods. Your doctor will tell you what foods to eat.  Stay at a healthy weight.  Lower your stress level.  Do not use illegal drugs.      Alcohol use  Do not drink alcohol if: ? Your doctor tells you not to drink. ? You are pregnant, may be pregnant, or are planning to become pregnant.  If you drink alcohol: ? Limit how much you use to:  0-1 drink a day for women.  0-2 drinks a day for men. ? Know how much alcohol is in your drink. In the [place] one drink equals one 12 oz bottle of beer (355 mL), one 5 oz glass of wine (148 mL), or one 1 oz glass of hard liquor (44 mL). General instructions  Work with your doctor to treat other problems you may have, such as diabetes or high blood pressure.  Get screened for depression. Get treatment if needed.  Keep your vaccines up to date. Get the flu shot (influenza vaccine) every year.  Keep all follow-up  visits as told by your doctor. This is important. Contact a doctor if:  You feel very sad.  You have trouble doing your daily activities. Get help right away if:  You have sudden, unexplained discomfort in your chest, arms, back, neck, jaw, or upper body.  You have shortness of breath.  You have sudden sweating or clammy skin.  You feel like you may vomit.  You vomit.  You feel tired or weak.  You get light-headed or dizzy.  You feel your heart beating fast.  You feel your heart skipping beats.  You have blood pressure that is higher than 180/120. These symptoms may be an emergency. Do not wait to see if the symptoms will go away. Get medical help right away. Call your local emergency services (911 in the [place] Do not drive yourself to the hospital. Summary  A heart attack occurs when blood and oxygen supply to the heart is cut off.  Do not take NSAIDs unless your doctor says it is okay.  Do not smoke. Avoid secondhand smoke.  Exercise regularly. Ask your doctor about a cardiac rehab program. This information is not intended to replace advice given to you by your health care provider. Make sure you discuss any questions you have with your health care provider. Document Revised: 10/17/2018 Document Reviewed: 10/17/2018 Elsevier Patient Education  [place] on my medicine - ELIQUIS (apixaban)  This medication education was reviewed with me or my healthcare representative as part of my discharge preparation.    Why was Eliquis prescribed for you? Eliquis was prescribed for you to reduce the risk of a blood clot forming that can cause a stroke if you have a medical condition called atrial fibrillation (a type of irregular heartbeat).  What do You need to know about Eliquis ? Take your Eliquis TWICE DAILY - one tablet in the morning and one tablet in the evening with or without food. If you have difficulty swallowing the tablet whole please  discuss with your pharmacist how to take the medication safely.  Take Eliquis exactly as prescribed by your doctor and DO NOT stop taking Eliquis without talking to the doctor who prescribed the medication.  Stopping may increase your risk of developing a stroke.  Refill your prescription before you run out.  After discharge, you should have regular check-up appointments with your healthcare provider that is prescribing your Eliquis.  In the future your dose may need to be changed if your kidney function or weight changes by a significant amount or as you get older.  What do you do if you miss a dose? If you miss a dose, take it as soon as you remember on the same day and resume taking twice daily.  Do not take more than one dose of ELIQUIS at the same time to make up a missed dose.  Important Safety Information A possible side effect of Eliquis is bleeding. You should call your healthcare provider right away if you experience any of the following: ? Bleeding from an injury or your nose that does not stop. ? Unusual colored urine (red or dark brown) or unusual colored stools (red or black). ? Unusual bruising for unknown reasons. ? A serious fall or if you hit your head (even if there is no bleeding).  Some medicines may interact with Eliquis and might increase your risk of bleeding or clotting while on Eliquis. To help avoid this, consult your healthcare provider or pharmacist prior to using any new prescription or non-prescription medications, including herbals, vitamins, non-steroidal anti-inflammatory drugs (NSAIDs) and supplements.  This website has more information on Eliquis (apixaban): http://www.eliquis.com/eliquis/home

## 2020-10-13 NOTE — Progress Notes (Signed)
Progress Note  Patient Name: [name] Date of Encounter: 10/13/2020  [facility] Cardiologist: Sinclair Grooms, MD   Subjective   Pt denies CP or dyspnea  Inpatient Medications    Scheduled Meds: . amiodarone  200 mg Oral Daily  . apixaban  5 mg Oral BID  . aspirin  81 mg Oral Daily  . atorvastatin  80 mg Oral Daily  . buprenorphine-naloxone  2 tablet Sublingual Daily  . clopidogrel  75 mg Oral Q breakfast  . dapagliflozin propanediol  10 mg Oral Daily  . lidocaine  1 patch Transdermal Q24H  . metoprolol succinate  75 mg Oral Daily  . ramelteon  8 mg Oral QHS  . roflumilast  500 mcg Oral Daily  . sodium chloride flush  3 mL Intravenous Q12H   Continuous Infusions: . sodium chloride 10 mL/hr at 10/10/20 1400  . sodium chloride     PRN Meds: sodium chloride, acetaminophen **OR** acetaminophen, acetaminophen, albuterol, methocarbamol, nitroGLYCERIN, sodium chloride flush   Vital Signs    Vitals:   10/12/20 0900 10/12/20 2013 10/13/20 0002 10/13/20 0519  BP: 101/61 104/61 (!) 105/59 (!) 105/59  Pulse:  75    Resp: 18 17 18    Temp: 98.2 F (36.8 C) (!) 97.3 F (36.3 C) (!) 97.4 F (36.3 C) (!) 97.4 F (36.3 C)  TempSrc: Oral Oral Oral Oral  SpO2:  100%    Weight:    96.3 kg  Height:        Intake/Output Summary (Last 24 hours) at 10/13/2020 0729 Last data filed at 10/12/2020 2300 Gross per 24 hour  Intake 3 ml  Output 100 ml  Net -97 ml   Last 3 Weights 10/13/2020 10/12/2020 10/11/2020  Weight (lbs) 212 lb 3.2 oz 209 lb 12.8 oz 207 lb 7.3 oz  Weight (kg) 96.253 kg 95.165 kg 94.1 kg      Telemetry    Atrial fibrillation- Personally Reviewed  Physical Exam   GEN: NAD Neck: No JVD, supple Cardiac: irregular Respiratory: Clear to auscultation bilaterally; no wheeze GI: Soft, NT/ND MS: No edema Neuro:  Grossly intact Psych: Normal affect   Labs    High Sensitivity Troponin:   Recent Labs  Lab 10/08/20 0825 10/08/20 1131  10/08/20 1644 10/08/20 2020 10/09/20 0810  TROPONINIHS 54* 1,637* 9,404* 11,331* 5,587*      Chemistry Recent Labs  Lab 10/10/20 0300 10/11/20 0320 10/12/20 0102 10/13/20 0341  NA 137 137 136 137  K 4.0 4.2 4.0 4.2  CL 106 105 104 104  CO2 23 24 23 25   GLUCOSE 88 120* 122* 111*  BUN 19 19 25* 25*  CREATININE 1.42* 1.42* 1.84* 1.67*  CALCIUM 8.3* 8.2* 8.4* 8.5*  PROT 7.2 6.5 6.8  --   ALBUMIN 3.1* 2.9* 3.0* 3.0*  AST 31 44* 40  --   ALT 15 16 19   --   ALKPHOS 77 71 77  --   BILITOT 0.8 1.2 0.7  --   GFRNONAA 52* 52* 38* 43*  ANIONGAP 8 8 9 8      Hematology Recent Labs  Lab 10/11/20 0320 10/12/20 0102 10/13/20 0341  WBC 9.9 11.2* 10.4  RBC 4.75 4.67 4.46  HGB 14.8 14.6 13.9  HCT 45.2 43.9 42.3  MCV 95.2 94.0 94.8  MCH 31.2 31.3 31.2  MCHC 32.7 33.3 32.9  RDW 13.7 14.0 14.0  PLT 226 201 223    BNP Recent Labs  Lab 10/08/20 0825  BNP 250.2*  Patient Profile     75 y.o. male with a PMH of chronic combined CHF/NICM, atrial flutter s/p ablation, now withparoxysmalatrial fibrillation oneliquis, HTN, COPD, polycythemia vera, CKD stage 3, polysubstance abuse (ETOH, heroin, cocaine) on suboxone,who is being seen for the evaluation ofacute on chronic SOB and elevated HsTrop.  Echocardiogram showed ejection fraction 40 to 45%, mild left ventricular hypertrophy, moderate to severe mitral regurgitation, mild to moderate tricuspid regurgitation, moderate aortic insufficiency, ascending aortic aneurysm measuring 49 mm.  Cardiac catheterization revealed 95% proximal LAD which was treated with drug-eluting stent.  Assessment & Plan    1 non-ST elevation myocardial infarction-remains pain-free status post PCI of LAD.  Continue aspirin and Plavix (discontinue aspirin after 30 days).  Continue beta-blocker and statin.  2 paroxysmal atrial fibrillation-patient remains in atrial fibrillation but is doing well with no symptoms.  Continue amiodarone and beta-blocker as  blood pressure is borderline.  Continue apixaban.  Can consider elective cardioversion and referral for possible ablation as an outpatient.    3 acute on chronic combined systolic/diastolic congestive heart failure-patient remains euvolemic.  Resume Lasix 40 mg daily.  Continue beta-blocker for LV dysfunction.  Can add low-dose ARB as an outpatient if blood pressure allows.  Check potassium and renal function 1 week after discharge.  As outlined previously I do not think his blood pressure will tolerate Entresto.   4 chronic stage IIIa kidney disease-creatinine improved this morning.  Resume Lasix 40 mg daily.  5 ascending thoracic aortic aneurysm-noted on echocardiogram.  This has been documented on previous CTA's.  Will need follow-up as an outpatient.  6 hypertension-blood pressure is controlled.  Continue present medications.  7 hyperlipidemia-continue statin.  8 valvular heart disease with moderate to severe mitral regurgitation and moderate aortic insufficiency-we will need follow-up echoes as an outpatient.  Patient can be discharged today on present medications.  Will check potassium and renal function in 1 week.  We will arrange follow-up with APP in 2 to 4 weeks.  Follow-up with Dr. Tamala Julian in 3 months.  We will sign off.  Please call with questions.  For questions or updates, please contact [place] Please consult [email or web address] for contact info under        Signed, Kirk Ruths, MD  10/13/2020, 7:29 AM

## 2020-10-13 NOTE — Discharge Summary (Signed)
Name: [name] MRN: [id number] DOB: 1945/09/08 75 y.o. PCP: Marianna Payment, MD  Date of Admission: 10/08/2020  8:19 AM Date of Discharge: 3/27/20223/27/22 Attending Physician: No att. providers found  Discharge Diagnosis: 1. NSTEMI 2. Severe LAD Stenosis s/p PCI  3. Persistent Atrial Fibrillation  4. 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 Thoracic Aortic Aneurysm (54mm) 6. Mitral, Tricuspid, and Aortic Valve Regurgitation  7. Hypotension (Hx of HTN)  8. AKI on CKD stage 3a  9. Pre-Diabetes  10. Hyperlipidemia  11. Opioid Use Disorder  12. Leukocytosis (Hx of Myeloproliferative Disorder)   Discharge Medications: Allergies as of 10/13/2020   No Known Allergies     Medication List    TAKE these medications   albuterol 108 (90 Base) MCG/ACT inhaler Commonly known as: ProAir HFA Inhale 1-2 puffs into the lungs every 6 (six) hours as needed for wheezing or shortness of breath.   amiodarone 200 MG tablet Commonly known as: PACERONE Take 1 tablet (200 mg total) by mouth daily. What changed: how much to take   aspirin EC 81 MG tablet Take 1 tablet (81 mg total) by mouth daily for 27 days. Swallow whole. Notes to patient: STOP taking aspirin on 11/09/20 [continue taking apixaban (Eliquis) and clopidogrel (Plavix)]   atorvastatin 80 MG tablet Commonly known as: LIPITOR Take 1 tablet (80 mg total) by mouth daily. Start taking on: October 14, 2020   camphor-menthol lotion Commonly known as: SARNA Apply topically 3 (three) times daily. What changed:   how much to take  when to take this  reasons to take this   clopidogrel 75 MG tablet Commonly known as: PLAVIX Take 1 tablet (75 mg total) by mouth daily with breakfast. Start taking on: October 14, 2020   dapagliflozin propanediol 10 MG Tabs tablet Commonly known as: FARXIGA Take 1 tablet (10 mg total) by mouth daily. Start taking on: October 14, 2020   Eliquis 5 MG Tabs tablet Generic drug:  apixaban TAKE 1 TABLET(5 MG) BY MOUTH TWICE DAILY What changed: See the new instructions.   apixaban 5 MG Tabs tablet Commonly known as: ELIQUIS Take 1 tablet (5 mg total) by mouth 2 (two) times daily. What changed: You were already taking a medication with the same name, and this prescription was added. Make sure you understand how and when to take each.   furosemide 40 MG tablet Commonly known as: LASIX Take 1 tablet (40 mg total) by mouth daily. What changed: when to take this   metoprolol succinate 25 MG 24 hr tablet Commonly known as: TOPROL-XL Take 3 tablets (75 mg total) by mouth daily. Start taking on: October 14, 2020   roflumilast 500 MCG Tabs tablet Commonly known as: DALIRESP Take 1 tablet (500 mcg total) by mouth daily.   Suboxone 8-2 MG Film Generic drug: Buprenorphine HCl-Naloxone HCl Place 2 Film under the tongue daily.   Symbicort 160-4.5 MCG/ACT inhaler Generic drug: budesonide-formoterol 2 puffs at bedtime.       Disposition and follow-up:   [name] was discharged from [facility] in Stable condition.  At the hospital follow up visit please address:  1.  Active Issues Needing Follow up:  DES of LAD - continue triple therapy (ASA, Plavix, Eliquis) until 11/09/20, then Plavix and Eliquis alone; be sure he follows up with cardiology and cardiac rehabilitation  Atrial Fibrillation - rates controlled on Amiodarone 200mg  daily and Toprol-XL 75mg  daily, continued at discharge. 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 Also discharged with Toprol and [place]. He would benefit from addition of low dose Losartan and would be unlikely to tolerate Entresto due to borderline pressures. Assess volume status Hypotension (Hx of HTN) - pressures were soft on admission in setting of GDMT titration. Continue to monitor on Toprol-XL 75mg   and consider adding Losartan AKI on CKD 3a - creatinine bumped prior to discharge, likely due to contrast nephropathy. Repeat labs Hyperlipidemia - LDL 83, HDL 31. Patient was started on Atorvastatin 80mg  in setting of CAD. Follow up lipid profile in ~6 weeks  Thoracic Aortic Aneurysm (4mm) - noted on ECHO although is stable from previous CT's. Will require ongoing monitoring  Pre-DM - Hgb A1c 5.8, not on any medications. Would benefit from nutrition / lifestyle counseling Myeloproliferative Disorder - intermittent leukocytosis noted throughout admission with hx PCV per chart. Continue to monitor   2.  Labs / imaging needed at time of follow-up: CMP, Magnesium; will need lipid profile in ~6 weeks and CT monitoring of thoracic aortic aneurysm   3.  Pending labs/ test needing follow-up: None   Follow-up Appointments:  Follow-up Information    [facility]. Go on 10/15/2020.   Specialty: Cardiology Why: AT 10AM. HEART IMPACT (HV TOC) within the [facility] [place] parking at [employer], off [address] all your medications with you. Contact information: [address] [phone] mc Isanti Weed       Marianna Payment, MD. Schedule an appointment as soon as possible for a visit in 2 week(s).   Specialty: Internal Medicine Why: Please call to schedule an appointment with your PCP within the next 1-2 weeks. Contact information: [address] [place] [address] [phone]        Evans Lance, MD .   Specialty: Cardiology Contact information: [phone] [address] [place] [address] [phone]        Belva Crome, MD Follow up.   Specialty: Cardiology Why: The office will call you with a follow up appt in the next 2-3 business days. Contact information: [address] [place] [address] [phone]                Hospital Course by problem list:  Severe Stenosis Proximal to mid LAD s/p PCI 10/10/20  Hyperlipidemia  [name] presented with acute hypoxic respiratory failure, A-fib with RVR, typical CP, SOB, and cough. Cardiology were consulted. He was found to have 95% stenosis of his LAD on cardiac catheterization, which was stented successfully 10/10/20 with 0% stenosis. His symptoms resolved and he was discharged with triple therapy (Eliquis 5mg  BID, ASA 81mg , and Plavix 75mg ). ASA is to be discontinued after 30 days (11/09/20) and Eliquis and Plavix are to be continued indefinitely. He was started on Atorvastatin 80mg  daily in the hospital for secondary prevention of MI. He was discharged with cardiology follow up.   Atrial Fibrillation Patient initially presented with A-Fib with RVR with a HR in the 150's-160's. He had been taking Amiodarone 100mg  daily and Eliquis 5mg  twice daily at home consistently. Cardiology was consulted. He was started on an amiodarone drip with telemetry. Rates improved and he was transitioned to PO amiodarone 200mg  daily with up-titration of Toprol-XL to 75mg  daily, which were continued at discharge. He  was continued on Eliquis as part of triple therapy as documented above. He has cardiology follow up. Cardiology had spoken to EP who recommend outpatient follow up for consideration of ablation.   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 initially presented euvolemic, although troponins were significantly elevated, BNP was elevated to 250 and initial CXR did show pulmonary edema with R > L infiltrates. His home Lasix 40mg  BID was held on admission due to concern for AKI. Limited ECHO here showed moderately reduced EF of 40-45% (decreased from prior) with global hypokinesis, mildly elevated PA systolic pressures, ~moderate MR, TR and AR, with dilated IVC and aortic aneurysm of 100mm. Acute reduction in EF was thought to be  in setting ischemia w/ A-fib. He was restarted on lower dose Lasix 40mg  daily with symptom improvement (although trace pitting edema) prior to discharge. Also discharged with [place] and Toprol-XL and would benefit from Losartan.  Hypotension Hx of HTN  Patient developed hypotension yesterday and pressures were borderline hypotensive overnight.  - Discontinue spironolactone 12.5mg  daily  - Hold Lasix today  - Continue Toprol XL 75mg  daily  - Continue to monitor  - Plan to add low dose ARB if renal function remains stable and pressures allow   Pre-DM Hgb A1c was 5.8. He was discharged with Wilder Glade and will benefit from continued counseling discussions and possibly metformin.   Hypotension (Hx of HTN)  Pressures were soft on admission in setting of GDMT titration. Continue to monitor on Toprol-XL 75mg  and consider adding Losartan.  AKI on CKD 3a  Creatinine bumped prior to discharge, likely due to contrast nephropathy. Otherwise, renal function was stable.   Relative Hyperlipidemia LDL 83, HDL 31. Patient was started on Atorvastatin 80mg  in setting of CAD. Follow up lipid profile in ~6 weeks.   Myeloproliferative Disorder  Patient had been noted to have PCV in the past. His hemoglobin normalized during admission, although he did have intermittent leukocytosis with history of myeloproliferative disease per chart review. Will require continued outpatient monitoring.   Discharge Subjective:  [name] is very eager to go home. He denies any symptoms including CP, palpitations, SOB, decreased appetite, LE swelling, cough, abdominal distention, or any other symptoms. He is able to stand for a prolonged period of time without dizziness. He states he has a primary care physician and a suboxone prescriber he follows with regularly.   Discharge Exam:   BP 120/80   Pulse 80   Temp (!) 97.4 F (36.3 C) (Oral)   Resp 16   Ht 5\' 6"  (1.676 m)   Wt 96.3 kg   SpO2 100%   BMI 34.25 kg/m   Discharge exam:  General: Patient is obese. No acute distress. HENT: MMM. No nasal discharge. Respiratory: Patient has minimal increased work of breathing on room air. No tachypnea. His lungs are CTA bilaterally, saturating in the high 90's on room air.  Cardiovascular: Rate is normal. Rhythm is irregularly irregular. There is trace bilateral LE pitting edema.  Neurological: Alert and oriented x 3. Abdominal: Obese. No tenderness to palpation, guarding or rebound.  MSK: Normal muscle bulk and tone.  Psych: Normal affect. Normal tone of voice.  Skin: No lesions or rashes.   Pertinent Labs, Studies, and Procedures:   Labs:  Troponin peak: 11,331 BNP 250.2 LDL 83, HDL 31 Hgb A1c 5.8 WBC ~ 11-12 Cr on discharge 1.67  CXR 10/08/20: COMPARISON:  CT 07/03/2020.  Chest x-ray 05/14/2019, 03/31/2019. FINDINGS:  Mediastinum hilar structures normal. Cardiomegaly with mild pulmonary venous congestion bilateral interstitial prominence. Findings suggest mild CHF. Low lung volumes with bibasilar atelectasis. Stable bilateral mild pleural thickening. Costophrenic angles incompletely imaged. No prominent pleural effusion noted. No pneumothorax. Metallic densities noted over the left mandible. IMPRESSION: 1. Cardiomegaly with mild pulmonary venous congestion and bilateral interstitial prominence. Findings suggest mild CHF. 2.  Low lung volumes with bibasilar atelectasis. Electronically Signed   By: Marcello Moores  Register   On: 10/08/2020 08:50  Limited ECHO 10/09/20: 1. Left ventricular ejection fraction, by estimation, is 40 to 45%. The  left ventricle has mildly decreased function. The left ventricle  demonstrates global hypokinesis. The left ventricular internal cavity size  was mildly dilated. There is mild left  ventricular hypertrophy. Left ventricular diastolic parameters are  indeterminate.  2. Right ventricular systolic function is normal. The right ventricular  size is normal. There  is mildly elevated pulmonary artery systolic  pressure. The estimated right ventricular systolic pressure is 96.7 mmHg.  3. The mitral valve is normal in structure. Moderate to severe mitral  valve regurgitation. Central MR jet, appears functional.  4. Tricuspid valve regurgitation is mild to moderate.  5. The aortic valve is tricuspid. Aortic valve regurgitation is moderate.  Mild to moderate aortic valve sclerosis/calcification is present, without  any evidence of aortic stenosis.  6. Aortic dilatation noted. Aneurysm of the ascending aorta, measuring 49  mm.  7. 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  R/LHC + Coronary Angiography 10/09/20: 1. Severe stenosis proximal to mid LAD (95% stenosis)  2. No obstructive disease in the RCA or Circumflex atery 3. Elevated right and left heart pressures.   PCI 10/10/20:  Prox LAD to Mid LAD lesion is 95% stenosed.  A drug-eluting stent was successfully placed using a STENT RESOLUTE ONYX 3.5X22.  Post intervention, there is a 0% residual stenosis.  Discharge Instructions: Discharge Instructions    (HEART FAILURE PATIENTS) Call MD:  Anytime you have any of the following symptoms: 1) 3 pound weight gain in 24 hours or 5 pounds in 1 week 2) shortness of breath, with or without a dry hacking cough 3) swelling in the hands, feet or stomach 4) if you have to sleep on extra pillows at night in order to breathe.   Complete by: As directed    Amb Referral to Cardiac Rehabilitation   Complete by: As directed    Diagnosis:  Coronary Stents NSTEMI     After initial evaluation and assessments completed: Virtual Based Care may be provided alone or in conjunction with Phase 2 Cardiac Rehab based on patient barriers.: Yes   Call MD for:  difficulty breathing, headache or visual disturbances   Complete by: As directed    Call MD for:  extreme fatigue   Complete by: As  directed    Call MD for:  persistant dizziness or light-headedness   Complete by: As directed    Call MD for:  severe uncontrolled pain   Complete by: As directed    Diet - low sodium heart healthy   Complete by: As directed    Diet Carb Modified   Complete by: As directed    Discharge instructions   Complete by: As directed    Mr. Maricela Curet were admitted to the hospital due to shortness of breath, cough, and chest pressure. You were found to have a blockage of one of the  large vessels supplying blood to your heart. This was successfully opened up with a stent. You were also found to have an irregular rhythm called atrial fibrillation. Your heart rate significantly improved with medications provided to you in the hospital. You were found to have high cholesterol for which we started you on Atorvastatin. We also started you on multiple medications as detailed below. It will be extremely important for you to take these medications regularly as prescribed and also arrange a follow up appointment where you can have all of these medications (aside from the aspirin) refilled.   Upon discharge, please START taking the following:  - ASA 1 tablet daily until you run out of pills (STOP taking when you run out 11/09/20)  - Atorvastatin 1 tablet daily  - Plavix 1 tablet daily  - Farxiga 1 tablet daily  - Toprol-XL 3 tablets daily (all at one time)   Please CHANGE the dose of the following medications to:  - Amiodarone 1 tablet (200mg ) daily  - Lasix 1 tablet (40mg ) daily   Please CONTINUE taking: - Eliquis 1 tablet in the morning, 1 tablet in the evening  - Suboxone 1 film under the tongue in the morning, 1 film in the evening  - Please continue using your home inhalers as prescribed  You have been scheduled for an appointment with cardiology at the [facility] 10/15/20 at 10am. It is very important that you attend this appointment and bring all of your medications  with you. There, you will be scheduled for additional follow up appointments. Please call [phone] if you find that you are unable to attend this appointment to reschedule.   Please also call the Internal Medicine Center [facility]) at [phone] to schedule an appointment with your PCP Dr. Marianna Payment in about 2 weeks. You will also need to call to schedule a follow up appointment with your Suboxone prescriber ASAP, although you do have a prescription of Suboxone waiting for you at your pharmacy.  Please return to the ED if you experience recurrence of your chest pain, shortness of breath, or severe weakness of fatigue. Call your PCP or go to the ED if you notice increased swelling, weight gain, or any other concerning symptoms.   It was a pleasure caring for you in the hospital. We hope you continue to feel better!  Dr. Konrad Penta   Increase activity slowly   Complete by: As directed       Signed: Jeralyn Bennett, MD 10/13/2020, 4:30 PM   Pager: [phone]

## 2020-10-14 ENCOUNTER — Telehealth (HOSPITAL_COMMUNITY): Payer: Self-pay

## 2020-10-14 NOTE — Telephone Encounter (Signed)
Called patient to see if he is interested in the Cardiac Rehab Program. Patient expressed interest. Explained scheduling process and went over insurance, patient verbalized understanding. Will contact patient for scheduling once f/u has been completed.  °

## 2020-10-14 NOTE — Telephone Encounter (Signed)
Pt insurance is active and benefits verified through Medicare A/B. Co-pay $0.00, DED $0.00/$0.00 met, out of pocket $0.00/$0.00 met, co-insurance 0%. No pre-authorization required. Passport, 10/14/20 @ 12:12PM, YIA#[id number]  2ndary insurance is active and benefits verified through Medicaid. Co-pay $0.00, DED $0.00/$0.00 met, out of pocket $0.00/$0.00 met, co-insurance 0%. No pre-authorization required. Passport, 10/14/20 @ 12:21PM, QGB#[id number]  Will contact patient to see if he is interested in the Cardiac Rehab Program. If interested, patient will need to complete follow up appt. Once completed, patient will be contacted for scheduling upon review by the RN Navigator.

## 2020-10-15 ENCOUNTER — Telehealth (HOSPITAL_COMMUNITY): Payer: Self-pay

## 2020-10-15 ENCOUNTER — Ambulatory Visit (HOSPITAL_COMMUNITY)
Admit: 2020-10-15 | Discharge: 2020-10-15 | Disposition: A | Discharge status: Home / Self Care | Payer: Medicare Other | Attending: Internal Medicine | Admitting: Internal Medicine

## 2020-10-15 ENCOUNTER — Encounter (HOSPITAL_COMMUNITY): Payer: Self-pay

## 2020-10-15 ENCOUNTER — Other Ambulatory Visit: Payer: Self-pay

## 2020-10-15 VITALS — BP 112/70 | HR 95 | Wt 210.4 lb

## 2020-10-15 DIAGNOSIS — I712 Thoracic aortic aneurysm, without rupture: Secondary | ICD-10-CM | POA: Insufficient documentation

## 2020-10-15 DIAGNOSIS — Z7982 Long term (current) use of aspirin: Secondary | ICD-10-CM | POA: Insufficient documentation

## 2020-10-15 DIAGNOSIS — Z7951 Long term (current) use of inhaled steroids: Secondary | ICD-10-CM | POA: Insufficient documentation

## 2020-10-15 DIAGNOSIS — Z7902 Long term (current) use of antithrombotics/antiplatelets: Secondary | ICD-10-CM | POA: Insufficient documentation

## 2020-10-15 DIAGNOSIS — F17200 Nicotine dependence, unspecified, uncomplicated: Secondary | ICD-10-CM

## 2020-10-15 DIAGNOSIS — I130 Hypertensive heart and chronic kidney disease with heart failure and stage 1 through stage 4 chronic kidney disease, or unspecified chronic kidney disease: Secondary | ICD-10-CM | POA: Insufficient documentation

## 2020-10-15 DIAGNOSIS — Z79899 Other long term (current) drug therapy: Secondary | ICD-10-CM | POA: Insufficient documentation

## 2020-10-15 DIAGNOSIS — I4819 Other persistent atrial fibrillation: Secondary | ICD-10-CM | POA: Insufficient documentation

## 2020-10-15 DIAGNOSIS — J449 Chronic obstructive pulmonary disease, unspecified: Secondary | ICD-10-CM | POA: Diagnosis not present

## 2020-10-15 DIAGNOSIS — Z955 Presence of coronary angioplasty implant and graft: Secondary | ICD-10-CM | POA: Insufficient documentation

## 2020-10-15 DIAGNOSIS — Z7901 Long term (current) use of anticoagulants: Secondary | ICD-10-CM | POA: Insufficient documentation

## 2020-10-15 DIAGNOSIS — I2510 Atherosclerotic heart disease of native coronary artery without angina pectoris: Secondary | ICD-10-CM | POA: Insufficient documentation

## 2020-10-15 DIAGNOSIS — F17210 Nicotine dependence, cigarettes, uncomplicated: Secondary | ICD-10-CM | POA: Diagnosis not present

## 2020-10-15 DIAGNOSIS — I5042 Chronic combined systolic (congestive) and diastolic (congestive) heart failure: Secondary | ICD-10-CM | POA: Diagnosis present

## 2020-10-15 DIAGNOSIS — Z789 Other specified health status: Secondary | ICD-10-CM

## 2020-10-15 DIAGNOSIS — I4892 Unspecified atrial flutter: Secondary | ICD-10-CM | POA: Insufficient documentation

## 2020-10-15 DIAGNOSIS — Z7289 Other problems related to lifestyle: Secondary | ICD-10-CM

## 2020-10-15 DIAGNOSIS — N1831 Chronic kidney disease, stage 3a: Secondary | ICD-10-CM | POA: Diagnosis not present

## 2020-10-15 DIAGNOSIS — E669 Obesity, unspecified: Secondary | ICD-10-CM | POA: Insufficient documentation

## 2020-10-15 DIAGNOSIS — I255 Ischemic cardiomyopathy: Secondary | ICD-10-CM | POA: Insufficient documentation

## 2020-10-15 DIAGNOSIS — G4733 Obstructive sleep apnea (adult) (pediatric): Secondary | ICD-10-CM | POA: Insufficient documentation

## 2020-10-15 DIAGNOSIS — N183 Chronic kidney disease, stage 3 unspecified: Secondary | ICD-10-CM | POA: Diagnosis not present

## 2020-10-15 LAB — BASIC METABOLIC PANEL
Anion gap: 8 (ref 5–15)
BUN: 26 mg/dL — ABNORMAL HIGH (ref 8–23)
CO2: 24 mmol/L (ref 22–32)
Calcium: 8.5 mg/dL — ABNORMAL LOW (ref 8.9–10.3)
Chloride: 108 mmol/L (ref 98–111)
Creatinine, Ser: 1.51 mg/dL — ABNORMAL HIGH (ref 0.61–1.24)
GFR, Estimated: 48 mL/min — ABNORMAL LOW (ref >60)
Glucose, Bld: 109 mg/dL — ABNORMAL HIGH (ref 70–99)
Potassium: 4.1 mmol/L (ref 3.5–5.1)
Sodium: 140 mmol/L (ref 135–145)

## 2020-10-15 MED ORDER — SPIRONOLACTONE 25 MG PO TABS: 12.5000 mg | ORAL_TABLET | Freq: Every day | ORAL | 0 refills | Status: DC

## 2020-10-15 NOTE — Telephone Encounter (Signed)
Called to confirm Heart & Vascular Transitions of Care appointment today 3/29 @ 10AM. Patient reminded to bring all medications and pill box organizer with them. Confirmed patient has transportation. Gave directions, instructed to utilize [place] parking.  Confirmed appointment prior to ending call.   Pricilla Holm, RN, BSN Heart Failure Nurse Navigator [phone]

## 2020-10-15 NOTE — Progress Notes (Signed)
[facility]  PCP: Marianna Payment Primary Cardiologist:Smith, Mallie Mussel  HPI:  [name] is a 75 y.o.  male  with a PMH significant for COPD, CAD LAD disease s/p PCI to mid LAD 09/2020, A flutter s/p ablation 2012, MDS, HTN, CKD3a, Tobacco use disorder, polycythemia vera, oud on suboxone,   Cardiac history dating back to 2012 diagnosed with combined CHF with EF 35%, initially felt to be non-ischemic in the setting of polysubstance abuse and atrial flutter. He underwent an atrial flutter ablation in 2012. EF reportedly improved to 55% in 2014, then back down again in 2020 to 35-40%. Has struggled to maintain NSR on amiodarone with break through afib after flutter ablation.  Multiple hospitalizations for respiratory failure per year since 2014.  Has very bad COPD.  Required intubation in July 2020 and September 2020 underwent diuresis and was extubated and hospitalized again in November dbut did not require intubation.  Didn't follow up after that but seen by EP again 06/2020 was doing okay at that time.  A repeat echo was ordered.  He was in NSR at that time.    Recently admitted 09/2020 with chest pain/NSTEMI and dyspnea found to have single vessel LAD disease and received PCI to mid LAD, was diuresed and discharged from the hospital on metoprolol, lasix, farxiga, amiodarone, eliquis and dapt.    Since hospitalization he denies any chest pain.  Still getting dyspneic with minimal exertion.  Still smoking 1-2 cigarettes daily.  Has had no alcohol since discharge.    95.4kg here today, discharge weight was 96.3kg   ROS: All systems negative except as listed in HPI, PMH and Problem List.  SH:  Social History   Socioeconomic History  . Marital status: Married    Spouse name: [name]  . Number of children: 1  . Years of education: Not on file  . Highest education level: 11th grade  Occupational History  . Occupation: Retired  Tobacco Use   . Smoking status: Current Some Day Smoker    Packs/day: 1.50    Years: 50.00    Pack years: 75.00    Types: Cigarettes  . Smokeless tobacco: Never Used  . Tobacco comment: 1-2 cigarettes a day for a couple years now. encouraged cessation  Vaping Use  . Vaping Use: Never used  Substance and Sexual Activity  . Alcohol use: Yes    Alcohol/week: 2.0 standard drinks    Types: 2 Shots of liquor per week    Comment: Sometimes.  . Drug use: Not Currently    Types: Heroin    Comment: stopped use in 2015. currently on suboxone  . Sexual activity: Not Currently  Other Topics Concern  . Not on file  Social History Narrative   Moved here from [place]. Lives with common law wife and grandchildren. He is retired from "general labor."   Social Determinants of Radio broadcast assistant Strain: Low Risk   . Difficulty of Paying Living Expenses: Not hard at all  Food Insecurity: No Food Insecurity  . Worried About Charity fundraiser in the Last Year: Never true  . Ran Out of Food in the Last Year: Never true  Transportation Needs: No Transportation Needs  . Lack of Transportation (Medical): No  . Lack of Transportation (Non-Medical): No  Physical Activity: Not on file  Stress: Not on file  Social Connections: Not on file  Intimate Partner Violence: Not on file    FH:  Family  History  Problem Relation Age of Onset  . Diabetes Mother   . Hypertension Mother   . Cirrhosis Father   . Alcohol abuse Father   . Colon cancer Neg Hx   . Rectal cancer Neg Hx   . Stomach cancer Neg Hx   . Esophageal cancer Neg Hx   . Colon polyps Neg Hx     Past Medical History:  Diagnosis Date  . Acute lower GI bleeding 02/15/2017  . Atrial fibrillation ([place])   . Cardiomyopathy    EF55% 11/14<<35%   . CHF (congestive heart failure) ([place])   . COPD (chronic obstructive pulmonary disease) ([place])   . Difficult airway for intubation    per telephone encounter notation  . Essential hypertension   .  GERD (gastroesophageal reflux disease)   . Gout   . Hepatitis    Possible history  . History of atrial flutter    Ablation 2012  . 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 Obesity   . Pneumonia X 1  . Primary polycythemia ([place]) 06/12/2013  . Renal insufficiency   . Substance abuse ([place])    History of alcohol; hx cocaine, heroin, crack use  . Syncope   . Tubular adenoma of colon 08/2014    Current Outpatient Medications  Medication Sig Dispense Refill  . albuterol (PROAIR HFA) 108 (90 Base) MCG/ACT inhaler Inhale 1-2 puffs into the lungs every 6 (six) hours as needed for wheezing or shortness of breath. 18 g 3  . amiodarone (PACERONE) 200 MG tablet Take 1 tablet (200 mg total) by mouth daily. 30 tablet 0  . apixaban (ELIQUIS) 5 MG TABS tablet Take 1 tablet (5 mg total) by mouth 2 (two) times daily. 60 tablet 0  . aspirin EC 81 MG tablet Take 1 tablet (81 mg total) by mouth daily for 27 days. Swallow whole. 27 tablet 0  . atorvastatin (LIPITOR) 80 MG tablet Take 1 tablet (80 mg total) by mouth daily. 30 tablet 0  . camphor-menthol (SARNA) lotion Apply topically 3 (three) times daily. (Patient taking differently: Apply 1 application topically daily as needed for itching.) 222 mL 0  . clopidogrel (PLAVIX) 75 MG tablet Take 1 tablet (75 mg total) by mouth daily with breakfast. 30 tablet 0  . dapagliflozin propanediol (FARXIGA) 10 MG TABS tablet Take 1 tablet (10 mg total) by mouth daily. 30 tablet 0  . furosemide (LASIX) 40 MG tablet Take 1 tablet (40 mg total) by mouth daily. 30 tablet 0  . metoprolol succinate (TOPROL-XL) 25 MG 24 hr tablet Take 3 tablets (75 mg total) by mouth daily. 30 tablet 0  . roflumilast (DALIRESP) 500 MCG TABS tablet Take 1 tablet (500 mcg total) by mouth daily. 30 tablet 11  . SUBOXONE 8-2 MG FILM Place 2 Film under the tongue daily.    . SYMBICORT 160-4.5 MCG/ACT inhaler 2 puffs at bedtime.     No current facility-administered medications for this  encounter.    Vitals:   10/15/20 1002  BP: 112/70  Pulse: 95  SpO2: 94%  Weight: 95.4 kg (210 lb 6 oz)    PHYSICAL EXAM: Cardiac: JVD 9, irregularly irregular with normal rate, clear s1 and s2, no murmurs, rubs or gallops, bilateral 2+ LE edema Pulmonary: Bibasilar rales, not in distress Abdominal: non distended abdomen, soft and nontender Psych: Alert, conversant, in good spirits   ECG    Afib rate 100, normal axis and QT interval  ASSESSMENT & PLAN:  Combined systolic and diastolic CHF,  Ischemic cardiomyopathy: -09/2020 recent non-ST elevation myocardial infarction LHC with single vessel disease, status post PCI of LAD.   -elevated R/L heart pressures on R heart cath -09/2020 ECHO w/ EF 40-45%, mild-mod TR, moderate MR, Moderate AI.  Will need serial echo's -NYHA Class III symptoms, volume overloaded on examination and REDS clip 38% today -Currently on toprol xL 75mg , farxiga 10, asa/plavix and eliquis with plans to drop asa after 30 days total  -He is quite symptomatic still and overloaded, will double lasix for the next 48 hours and add spironolactone low dose to keep K up and help with diuresis, bp may be limiting for any further GDMT but perhaps when he's optimized from a volume standpoint this can be readdressed and perhaps back off on lasix and add low dose losartan.   -likely needs repeat sleep study and PFTs  Persistent Afib: -patient remains in atrial fibrillation. HR about what it was during admission -Continue amiodarone and Carvedilol -CHADS2VASC is 3 continue eliquis  -followed by EP, needs follow up appointment    -needs to wear cpap for severe OSA  Severe OSA: -last sleep study 2020 likely needs repeat -needs to wear cpap  CKD3a: -check bmp today -diurese  Ascending thoracic aortic aneurysm: -stable at 4.9cm, continue surveillance  Tobacco Use disorder: -improved but still smoking, continue to encourage complete cessation  Alcohol  use: -discussed need for continue cessation  COPD: -GOLD D, followed by pulmonology -no wheezing today, feel mostly his current dyspnea is from volume overload -needs to stop smoking   Follow up with gen cardiology

## 2020-10-15 NOTE — Patient Instructions (Addendum)
Start Spironolactone 12.5 mg (1/2 tab) Daily  Take Furosemide 80 mg (2 TABS) DAILY FOR 2 DAYS ONLY, then back to 40 mg (1 tab) Daily  Labs done today, we will call you for abnormal results, otherwise no news is good news!!!  Do the following things EVERYDAY: 1) Weigh yourself in the morning before breakfast. Write it down and keep it in a log. 2) Take your medicines as prescribed 3) Eat low salt foods--Limit salt (sodium) to 2000 mg per day.  4) Stay as active as you can everyday 5) Limit all fluids for the day to less than 2 liters  If you have any questions, issues, or concerns before your next appointment please call our office at [phone], opt. 2 and leave a message for the triage nurse.  Thank you for allowing [place] to provider your heart failure care after your recent hospitalization. Please follow-up with [facility] on [facility] as scheduled on Wed April 6th

## 2020-10-15 NOTE — Progress Notes (Signed)
[facility] Transitions of Care Clinic Heart Failure Pharmacist Encounter  HPI:  75 yo M with PMH of CHF, atrial flutter/fibrillation, HTN, COPD, polycythemia vera, CKD III, polysubstance abuse, and ascending aortic aneurysm. He presented to the ED on 10/08/20 withshortness of breath, sweating, and non productive cough. He was found to be in afib RVR and with elevated troponin. He was taken for [facility] on 10/09/20 and found to have severe stenosis prox-midLAD and elevated filling pressures (RA 11, PCWP 26, CO 3.85, CI 1.86). Elective PCI on 10/10/20 was completed for LAD lesion. An ECHO was done on 10/09/20 and LVEF was 40-45%. He was then discharged on 10/13/20.   Today, [name] presents to the [facility] for follow up. He reports having continual shortness of breath and DOE. He denies having orthopnea. Mild LE edema on exam. He denies having lightheadedness or dizziness. He does not have a BP cuff or scale at home. He has been following a low sodium and fluid restricted diet at home and has been taking all medications as prescribed.  HF Medications: Furosemide 40 mg daily Metoprolol XL 75 mg daily Farxiga 10 mg daily  Has the patient been experiencing any side effects to the medications prescribed?  no  Does the patient have any problems obtaining medications due to transportation or finances?   no  Understanding of regimen: good Understanding of indications: good Potential of compliance: good Patient understands to avoid NSAIDs. Patient understands to avoid decongestants.   Pertinent Lab Values: . Serum creatinine 1.51, BUN 26, Potassium 4.1, Sodium 140  Vital Signs: . Weight: 210 lbs (discharge weight: 212 lbs) . Blood pressure: 112/70 mmHg  . Heart rate: 95 bpm  . ReDS 38%  Medication Assistance / Insurance Benefits Check: Does the patient have prescription insurance?  Yes Type of insurance plan: [place] Medicaid  Outpatient Pharmacy:   Current outpatient pharmacy: [facility] Was the [facility] used to supply discharge medications? no  Is the patient willing to transition their outpatient pharmacy to utilize a [facility] outpatient pharmacy with or without mail order?   No  Assessment: 1) Chronic systolic CHF (EF 81-82%), due to ICM. NYHA class II symptoms. Fluid overloaded on exam - mild LE edema and ReDS elevated to 38% (normal 25-35%) - Increase furosemide to 80 mg x 2 days, then resume 40 mg daily - Continue metoprolol XL 75 mg daily - Continue Farxiga 10 mg daily - Start spironolactone 12.5 mg daily. Check BMET today and repeat in 1 week at next visit.  Plan: 1) Medication changes: - Increase furosemide to 80 mg daily x 2 days, then resume 40 mg daily - Start spironolactone 12.5 mg daily  2) Patient Assistance: - Provided scale and pill splitter in clinic today - Recommended him to follow up with [facility] for BP cuff  3) Follow up: - Next appointment with [facility] on 10/23/20  Kerby Nora, PharmD, BCPS Heart Failure Transitions of Care Clinic Pharmacist [phone]

## 2020-10-22 NOTE — Progress Notes (Signed)
Cardiology Office Note    Date:  10/23/2020   ID:  [name], [name] 1945/09/05, MRN [id number]   PCP:  Marianna Payment, [place]  Cardiologist:  Sinclair Grooms, MD  Advanced Practice Provider:  No care team member to display Electrophysiologist:  Cristopher Peru, MD   [phone]   No chief complaint on file.   History of Present Illness:  [name] is a 75 y.o. male with NICM EF 35% 2012 in the setting of polysubstance abuse (ETOH, heroin, cocaine) on suboxone, and Aflutter, EF 55% 2014, 35-40% 2020, CAD NSTEMI  DES mid LAD 08/2020, Aflutter ablation 2012, PAF with breakthrough on amiodarone, HTN, CKD3, tobacco abuse, polycythemia vera, COPD, ascending aortic aneurysm 49 mm.  Patient had NSTEMI DES mLAD 08/2020 placed on Plavix and aspirin with plans to discontinue aspirin after 30 days.  Patient remained in atrial fibrillation but amiodarone was continued as blood pressure was borderline and could not titrate up beta-blocker.  Consider elective cardioversion referral for possible ablation as outpatient.  Echo 10/09/20 LVEF 40 to 45% and moderate to severe MR will need follow-up echoes as outpatient.  Patient was seen at [facility] 10/15/20 and had fluid overload. Lasix doubled for 48 hr and spironolactone added. Feels much better. No shortness of breath or cardiac complaints.    Past Medical History:  Diagnosis Date  . Acute lower GI bleeding 02/15/2017  . Atrial fibrillation ([place])   . Cardiomyopathy    EF55% 11/14<<35%   . CHF (congestive heart failure) ([place])   . COPD (chronic obstructive pulmonary disease) ([place])   . Difficult airway for intubation    per telephone encounter notation  . Essential hypertension   . GERD (gastroesophageal reflux disease)   . Gout   . Hepatitis    Possible history  . History of atrial flutter    Ablation 2012  . 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 Obesity   . Pneumonia X 1   . Primary polycythemia ([place]) 06/12/2013  . Renal insufficiency   . Substance abuse ([place])    History of alcohol; hx cocaine, heroin, crack use  . Syncope   . Tubular adenoma of colon 08/2014    Past Surgical History:  Procedure Laterality Date  . CARDIAC ELECTROPHYSIOLOGY MAPPING AND ABLATION  08/2010   [name] 09/07/2010 (12/14/2012)  . COLONOSCOPY     15-20 years ago had colon in [place]  . COLONOSCOPY WITH PROPOFOL N/A 02/12/2020   Procedure: COLONOSCOPY WITH PROPOFOL;  Surgeon: Ladene Artist, MD;  Location: [facility];  Service: Endoscopy;  Laterality: N/A;  . CORONARY STENT INTERVENTION N/A 10/10/2020   Procedure: CORONARY STENT INTERVENTION;  Surgeon: Lorretta Harp, MD;  Location: [facility];  Service: Cardiovascular;  Laterality: N/A;  . EXCISIONAL HEMORRHOIDECTOMY  1970's  . LOOP RECORDER IMPLANT N/A 08/23/2013   Procedure: LOOP RECORDER IMPLANT;  Surgeon: Deboraha Sprang, MD;  Location: [facility];  Service: Cardiovascular;  Laterality: N/A;  . MULTIPLE EXTRACTIONS WITH ALVEOLOPLASTY Bilateral 01/24/2016   Procedure: MULTIPLE EXTRACTION WITH ALVEOLOPLASTY BILATERAL;  Surgeon: Diona Browner, DDS;  Location: [facility];  Service: Oral Surgery;  Laterality: Bilateral;  . MULTIPLE TOOTH EXTRACTIONS  01/24/2016   MULTIPLE EXTRACTION WITH ALVEOLOPLASTY BILATERAL (Bilateral)  . POLYPECTOMY  02/12/2020   Procedure: POLYPECTOMY;  Surgeon: Ladene Artist, MD;  Location: [facility];  Service: Endoscopy;;  . RIGHT/LEFT HEART CATH AND CORONARY ANGIOGRAPHY N/A 10/09/2020   Procedure: RIGHT/LEFT  HEART CATH AND CORONARY ANGIOGRAPHY;  Surgeon: Burnell Blanks, MD;  Location: [facility];  Service: Cardiovascular;  Laterality: N/A;    Current Medications: Current Meds  Medication Sig  . albuterol (PROAIR HFA) 108 (90 Base) MCG/ACT inhaler Inhale 1-2 puffs into the lungs every 6 (six) hours as needed for wheezing or shortness of breath.  [name] amiodarone (PACERONE) 200 MG tablet  Take 1 tablet (200 mg total) by mouth daily.  [name] apixaban (ELIQUIS) 5 MG TABS tablet Take 1 tablet (5 mg total) by mouth 2 (two) times daily.  [name] atorvastatin (LIPITOR) 80 MG tablet Take 1 tablet (80 mg total) by mouth daily.  . camphor-menthol (SARNA) lotion Apply topically 3 (three) times daily. (Patient taking differently: Apply 1 application topically daily as needed for itching.)  . clopidogrel (PLAVIX) 75 MG tablet Take 1 tablet (75 mg total) by mouth daily with breakfast.  . dapagliflozin propanediol (FARXIGA) 10 MG TABS tablet Take 1 tablet (10 mg total) by mouth daily.  . furosemide (LASIX) 40 MG tablet Take 1 tablet (40 mg total) by mouth daily.  . metoprolol succinate (TOPROL-XL) 25 MG 24 hr tablet Take 3 tablets (75 mg total) by mouth daily.  . roflumilast (DALIRESP) 500 MCG TABS tablet Take 1 tablet (500 mcg total) by mouth daily.  [name] spironolactone (ALDACTONE) 25 MG tablet Take 0.5 tablets (12.5 mg total) by mouth daily.  . SUBOXONE 8-2 MG FILM Place 2 Film under the tongue daily.  . SYMBICORT 160-4.5 MCG/ACT inhaler 2 puffs at bedtime.  . [DISCONTINUED] aspirin EC 81 MG tablet Take 1 tablet (81 mg total) by mouth daily for 27 days. Swallow whole.     Allergies:   Patient has no known allergies.   Social History   Socioeconomic History  . Marital status: Married    Spouse name: [name]  . Number of children: 1  . Years of education: Not on file  . Highest education level: 11th grade  Occupational History  . Occupation: Retired  Tobacco Use  . Smoking status: Current Some Day Smoker    Packs/day: 1.50    Years: 50.00    Pack years: 75.00    Types: Cigarettes  . Smokeless tobacco: Never Used  . Tobacco comment: 1-2 cigarettes a day for a couple years now. encouraged cessation  Vaping Use  . Vaping Use: Never used  Substance and Sexual Activity  . Alcohol use: Yes    Alcohol/week: 2.0 standard drinks    Types: 2 Shots of liquor per week    Comment: Sometimes.  . Drug  use: Not Currently    Types: Heroin    Comment: stopped use in 2015. currently on suboxone  . Sexual activity: Not Currently  Other Topics Concern  . Not on file  Social History Narrative   Moved here from [place]. Lives with common law wife and grandchildren. He is retired from "general labor."   Social Determinants of Radio broadcast assistant Strain: Low Risk   . Difficulty of Paying Living Expenses: Not hard at all  Food Insecurity: No Food Insecurity  . Worried About Charity fundraiser in the Last Year: Never true  . Ran Out of Food in the Last Year: Never true  Transportation Needs: No Transportation Needs  . Lack of Transportation (Medical): No  . Lack of Transportation (Non-Medical): No  Physical Activity: Not on file  Stress: Not on file  Social Connections: Not on file     Family History:  The patient's family history includes Alcohol abuse in his father; Cirrhosis in his father; Diabetes in his mother; Hypertension in his mother.   ROS:   Please see the history of present illness.    ROS All other systems reviewed and are negative.   PHYSICAL EXAM:   VS:  BP 120/60   Pulse (!) 49   Ht 5\' 6"  (1.676 m)   Wt 205 lb 3.2 oz (93.1 kg)   SpO2 92%   BMI 33.12 kg/m   Physical Exam  GEN: Obese, in no acute distress  Neck: no JVD, carotid bruits, or masses Cardiac:irreg irreg; no murmurs, rubs, or gallops  Respiratory:  clear to auscultation bilaterally, normal work of breathing GI: soft, nontender, nondistended, + BS Ext: trace edema otherwise without cyanosis, clubbing,  Good distal pulses bilaterally Neuro:  Alert and Oriented x 3 Psych: euthymic mood, full affect  Wt Readings from Last 3 Encounters:  10/23/20 205 lb 3.2 oz (93.1 kg)  10/15/20 210 lb 6 oz (95.4 kg)  10/13/20 212 lb 3.2 oz (96.3 kg)      Studies/Labs Reviewed:   EKG:  EKG is not ordered today.   Recent Labs: 07/16/2020: TSH 1.410 10/08/2020: B Natriuretic Peptide 250.2 10/12/2020: ALT  19 10/13/2020: Hemoglobin 13.9; Magnesium 2.4; Platelets 223 10/15/2020: BUN 26; Creatinine, Ser 1.51; Potassium 4.1; Sodium 140   Lipid Panel    Component Value Date/Time   CHOL 133 10/09/2020 0301   TRIG 96 10/09/2020 0301   HDL 31 (L) 10/09/2020 0301   CHOLHDL 4.3 10/09/2020 0301   VLDL 19 10/09/2020 0301   LDLCALC 83 10/09/2020 0301    Additional studies/ records that were reviewed today include:  PCI 10/10/20  Prox LAD to Mid LAD lesion is 95% stenosed.  A drug-eluting stent was successfully placed using a STENT RESOLUTE ONYX 3.5X22.  Post intervention, there is a 0% residual stenosis.   [name] is a 75 y.o. male      [id number] LOCATION:  FACILITY: [facility]  PHYSICIAN: [name], M.D. [birth year]     DATE OF PROCEDURE:  10/10/2020  Cath 10/09/20  Prox LAD lesion is 95% stenosed.   1. Severe stenosis proximal to mid LAD 2. No obstructive disease in the RCA or Circumflex atery 3. Elevated right and left heart pressures.    Echo 10/09/20 IMPRESSIONS     1. Left ventricular ejection fraction, by estimation, is 40 to 45%. The  left ventricle has mildly decreased function. The left ventricle  demonstrates global hypokinesis. The left ventricular internal cavity size  was mildly dilated. There is mild left  ventricular hypertrophy. Left ventricular diastolic parameters are  indeterminate.   2. Right ventricular systolic function is normal. The right ventricular  size is normal. There is mildly elevated pulmonary artery systolic  pressure. The estimated right ventricular systolic pressure is 32.2 mmHg.   3. The mitral valve is normal in structure. Moderate to severe mitral  valve regurgitation. Central MR jet, appears functional.   4. Tricuspid valve regurgitation is mild to moderate.   5. The aortic valve is tricuspid. Aortic valve regurgitation is moderate.  Mild to moderate aortic valve sclerosis/calcification is present, without  any evidence  of aortic stenosis.   6. Aortic dilatation noted. Aneurysm of the ascending aorta, measuring 49  mm.   7. 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    Risk Assessment/Calculations:    CHA2DS2-VASc Score =  4  This indicates a 4.8% annual risk of stroke. The patient's score is based upon: CHF History: Yes HTN History: Yes Diabetes History: No Stroke History: No Vascular Disease History: Yes Age Score: 1 Gender Score: 0        ASSESSMENT:    1. Chronic combined systolic and diastolic congestive heart failure ([place])   2. Coronary artery disease involving native coronary artery of native heart without angina pectoris   3. Ischemic cardiomyopathy   4. Persistent atrial fibrillation ([place])   5. S/P ablation of atrial flutter   6. Essential hypertension   7. Stage 3 chronic kidney disease, unspecified whether stage 3a or 3b CKD ([place])   8. Ascending aortic aneurysm ([place])   9. OSA (obstructive sleep apnea)      PLAN:  In order of problems listed above:  CAD status post NSTEMI treated with DES to the mid LAD 08/2020 on Plavix and aspirin with plans to discontinue aspirin after 30 days-will stop ASA  Ischemic cardiomyopathy 40 to 45% with moderate to severe MR on echo 10/09/2020 need follow-up echo in future. Followed by [facility]-spironolactone added last week. Check bmet.  Persistent atrial fibrillation continued on amiodarone to help with rate control consider elective cardioversion or  possible ablation as outpatient-has f/u with Dr. Lovena Le. On Eliquis and carvedilol as well.  History of atrial flutter ablation 2012  Hypertension BP stable  CKD stage III-checking today  Ascending aortic aneurysm  49 mm  OSA severe on sleep study in 2020 but never got CPAP.  Shared Decision Making/Informed Consent        Medication Adjustments/Labs and Tests Ordered: Current medicines are reviewed at length  with the patient today.  Concerns regarding medicines are outlined above.  Medication changes, Labs and Tests ordered today are listed in the Patient Instructions below. Patient Instructions  Medication Instructions:  Your physician recommends that you continue on your current medications as directed. Please refer to the Current Medication list given to you today.  *If you need a refill on your cardiac medications before your next appointment, please call your pharmacy*   Lab Work: TODAY:BMET If you have labs (blood work) drawn today and your tests are completely normal, you will receive your results only by: [name] MyChart Message (if you have MyChart) OR . A paper copy in the mail If you have any lab test that is abnormal or we need to change your treatment, we will call you to review the results.   Testing/Procedures: none   Follow-Up: At [facility], you and your health needs are our priority.  As part of our continuing mission to provide you with exceptional heart care, we have created designated Provider Care Teams.  These Care Teams include your primary Cardiologist (physician) and Advanced Practice Providers (APPs -  Physician Assistants and Nurse Practitioners) who all work together to provide you with the care you need, when you need it.  We recommend signing up for the patient portal called "MyChart".  Sign up information is provided on this After Visit Summary.  MyChart is used to connect with patients for Virtual Visits (Telemedicine).  Patients are able to view lab/test results, encounter notes, upcoming appointments, etc.  Non-urgent messages can be sent to your provider as well.   To learn more about what you can do with MyChart, go to [email or web address].    Your next appointment:   01/09/2021 with Dr. Lovena Le    Other Instructions You have been referred to see Dr. Radford Pax to  follow-up on history of sleep apnea.      Sumner Boast, PA-C  10/23/2020 11:57 AM     [facility] [address], [place], [place]  [address] Phone: [phone]; Fax: [phone]

## 2020-10-23 ENCOUNTER — Telehealth (HOSPITAL_COMMUNITY): Payer: Self-pay | Admitting: *Deleted

## 2020-10-23 ENCOUNTER — Other Ambulatory Visit: Payer: Self-pay

## 2020-10-23 ENCOUNTER — Encounter: Payer: Self-pay | Admitting: Physician Assistant

## 2020-10-23 ENCOUNTER — Ambulatory Visit (INDEPENDENT_AMBULATORY_CARE_PROVIDER_SITE_OTHER): Payer: Medicare Other | Admitting: Physician Assistant

## 2020-10-23 VITALS — BP 120/60 | HR 49 | Ht 66.0 in | Wt 205.2 lb

## 2020-10-23 DIAGNOSIS — G4733 Obstructive sleep apnea (adult) (pediatric): Secondary | ICD-10-CM

## 2020-10-23 DIAGNOSIS — Z9889 Other specified postprocedural states: Secondary | ICD-10-CM

## 2020-10-23 DIAGNOSIS — I5042 Chronic combined systolic (congestive) and diastolic (congestive) heart failure: Secondary | ICD-10-CM | POA: Diagnosis not present

## 2020-10-23 DIAGNOSIS — I255 Ischemic cardiomyopathy: Secondary | ICD-10-CM | POA: Diagnosis not present

## 2020-10-23 DIAGNOSIS — I2510 Atherosclerotic heart disease of native coronary artery without angina pectoris: Secondary | ICD-10-CM

## 2020-10-23 DIAGNOSIS — I4819 Other persistent atrial fibrillation: Secondary | ICD-10-CM

## 2020-10-23 DIAGNOSIS — I712 Thoracic aortic aneurysm, without rupture: Secondary | ICD-10-CM

## 2020-10-23 DIAGNOSIS — I10 Essential (primary) hypertension: Secondary | ICD-10-CM

## 2020-10-23 DIAGNOSIS — N183 Chronic kidney disease, stage 3 unspecified: Secondary | ICD-10-CM

## 2020-10-23 DIAGNOSIS — I7121 Aneurysm of the ascending aorta, without rupture: Secondary | ICD-10-CM

## 2020-10-23 DIAGNOSIS — Z8679 Personal history of other diseases of the circulatory system: Secondary | ICD-10-CM

## 2020-10-23 LAB — BASIC METABOLIC PANEL
BUN/Creatinine Ratio: 18 (ref 10–24)
BUN: 26 mg/dL (ref 8–27)
CO2: 20 mmol/L (ref 20–29)
Calcium: 9.1 mg/dL (ref 8.6–10.2)
Chloride: 107 mmol/L — ABNORMAL HIGH (ref 96–106)
Creatinine, Ser: 1.43 mg/dL — ABNORMAL HIGH (ref 0.76–1.27)
Glucose: 128 mg/dL — ABNORMAL HIGH (ref 65–99)
Potassium: 4.5 mmol/L (ref 3.5–5.2)
Sodium: 143 mmol/L (ref 134–144)
eGFR: 52 mL/min/{1.73_m2} — ABNORMAL LOW (ref >59)

## 2020-10-23 NOTE — Patient Instructions (Signed)
Medication Instructions:  Your physician recommends that you continue on your current medications as directed. Please refer to the Current Medication list given to you today.  *If you need a refill on your cardiac medications before your next appointment, please call your pharmacy*   Lab Work: TODAY:BMET If you have labs (blood work) drawn today and your tests are completely normal, you will receive your results only by: [name] MyChart Message (if you have MyChart) OR . A paper copy in the mail If you have any lab test that is abnormal or we need to change your treatment, we will call you to review the results.   Testing/Procedures: none   Follow-Up: At [facility], you and your health needs are our priority.  As part of our continuing mission to provide you with exceptional heart care, we have created designated Provider Care Teams.  These Care Teams include your primary Cardiologist (physician) and Advanced Practice Providers (APPs -  Physician Assistants and Nurse Practitioners) who all work together to provide you with the care you need, when you need it.  We recommend signing up for the patient portal called "MyChart".  Sign up information is provided on this After Visit Summary.  MyChart is used to connect with patients for Virtual Visits (Telemedicine).  Patients are able to view lab/test results, encounter notes, upcoming appointments, etc.  Non-urgent messages can be sent to your provider as well.   To learn more about what you can do with MyChart, go to [email or web address].    Your next appointment:   01/09/2021 with Dr. Lovena Le    Other Instructions You have been referred to see Dr. Radford Pax to follow-up on history of sleep apnea.

## 2020-10-23 NOTE — Telephone Encounter (Signed)
-----   Message from Imogene Burn, PA-C sent at 10/23/2020  2:05 PM EDT ----- This is the dot phrase we use for aneurysms. Hope this helps.  One of your tests has shown an aneurysm The word "aneurysm" refers to a bulge in an artery (blood vessel). Most people think of them in the context of an emergency, but yours was found incidentally. At this point there is nothing you need to do from a procedure standpoint, but there are some important things to keep in mind for day-to-day life.  Mainstays of therapy for aneurysms include very good blood pressure control, healthy lifestyle, and avoiding tobacco products and street drugs. Research has raised concern that antibiotics in the fluoroquinolone class could be associated with increased risk of having an aneurysm develop or tear. This includes medicines that end in "floxacin," like Cipro or Levaquin. Make sure to discuss this information with other healthcare providers if you require antibiotics.  Since aneurysms can run in families, you should discuss your diagnosis with first degree relatives as they may need to be screened for this. Regular mild-moderate physical exercise is important, but avoid heavy lifting/weight lifting over 30lbs, chopping wood, shoveling snow or digging heavy earth with a shovel. It is best to avoid activities that cause grunting or straining (medically referred to as a "Valsalva maneuver"). This happens when a person bears down against a closed throat to increase the strength of arm or abdominal muscles. There's often a tendency to do this when lifting heavy weights, doing sit-ups, push-ups or chin-ups, etc., but it may be harmful.  This is a finding I would expect to be monitored periodically by your cardiology team. Most unruptured thoracic aortic aneurysms cause no symptoms, so they are often found during exams for other conditions. Contact a health care provider if you develop any discomfort in your upper back, neck, abdomen,  trouble swallowing, cough or hoarseness, or unexplained weight loss. Get help right away if you develop severe pain in your upper back or abdomen that may move into your chest and arms, or any other concerning symptoms such as shortness of breath or fever.   ----- Message ----- From: Rowe Pavy, RN Sent: 10/23/2020   2:04 PM EDT To: Imogene Burn, PA-C  Jeannie Done,  The above pt referred to cardiac rehab s/p Nstemi/Des seen in follow up today.  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  Last image 07/03/2020.  "Stable"per discharge summary with recommendations for continued surveillance.  Any BP parameters to observe at rest and exertion? Any restrictions of activity or movement? Any weight limitation for hand weights?  Thanks so much for your valued input!  Cherre Huger, BSN Cardiac and Training and development officer

## 2020-11-18 ENCOUNTER — Other Ambulatory Visit: Payer: Self-pay | Admitting: Student

## 2020-11-22 ENCOUNTER — Telehealth: Payer: Self-pay | Admitting: Internal Medicine

## 2020-11-22 NOTE — Telephone Encounter (Signed)
TOC HFU APPT [facility] FOR 11/27/2020 @ 2:15 PM

## 2020-11-26 ENCOUNTER — Ambulatory Visit: Payer: Medicare Other | Admitting: Internal Medicine

## 2020-11-26 ENCOUNTER — Telehealth (HOSPITAL_COMMUNITY): Payer: Self-pay

## 2020-11-26 NOTE — Telephone Encounter (Signed)
Pt is no longer interested in the cardiac rehab program. Advised pt that if anything changes to call back. Closed referral.

## 2020-11-27 ENCOUNTER — Ambulatory Visit (INDEPENDENT_AMBULATORY_CARE_PROVIDER_SITE_OTHER): Payer: Medicare Other | Admitting: Internal Medicine

## 2020-11-27 ENCOUNTER — Ambulatory Visit (HOSPITAL_COMMUNITY)
Admission: RE | Admit: 2020-11-27 | Discharge: 2020-11-27 | Disposition: A | Discharge status: Home / Self Care | Payer: Medicare Other | Source: Ambulatory Visit | Attending: Family Medicine | Admitting: Family Medicine

## 2020-11-27 VITALS — BP 106/60 | HR 85 | Temp 98.1°F | Ht 66.0 in | Wt 204.2 lb

## 2020-11-27 DIAGNOSIS — D45 Polycythemia vera: Secondary | ICD-10-CM

## 2020-11-27 DIAGNOSIS — I4891 Unspecified atrial fibrillation: Secondary | ICD-10-CM | POA: Insufficient documentation

## 2020-11-27 DIAGNOSIS — I10 Essential (primary) hypertension: Secondary | ICD-10-CM | POA: Diagnosis not present

## 2020-11-27 DIAGNOSIS — I5030 Unspecified diastolic (congestive) heart failure: Secondary | ICD-10-CM

## 2020-11-27 DIAGNOSIS — D471 Chronic myeloproliferative disease: Secondary | ICD-10-CM

## 2020-11-27 DIAGNOSIS — E785 Hyperlipidemia, unspecified: Secondary | ICD-10-CM

## 2020-11-27 DIAGNOSIS — I5042 Chronic combined systolic (congestive) and diastolic (congestive) heart failure: Secondary | ICD-10-CM | POA: Diagnosis not present

## 2020-11-27 DIAGNOSIS — I214 Non-ST elevation (NSTEMI) myocardial infarction: Secondary | ICD-10-CM | POA: Diagnosis not present

## 2020-11-27 MED ORDER — CLOPIDOGREL BISULFATE 75 MG PO TABS: 75.0000 mg | ORAL_TABLET | Freq: Every day | ORAL | 5 refills | Status: DC

## 2020-11-27 MED ORDER — AMIODARONE HCL 200 MG PO TABS
200.0000 mg | ORAL_TABLET | Freq: Every day | ORAL | 1 refills | Status: DC
Start: 2020-11-27 — End: 2021-01-30

## 2020-11-27 MED ORDER — ATORVASTATIN CALCIUM 80 MG PO TABS: 80.0000 mg | ORAL_TABLET | Freq: Every day | ORAL | 5 refills | Status: DC

## 2020-11-27 MED ORDER — ELIQUIS 5 MG PO TABS: ORAL_TABLET | ORAL | 5 refills | Status: DC

## 2020-11-27 NOTE — Assessment & Plan Note (Signed)
Hx of NSTEMI with 95% stenosis of LAD status post drug-eluting stent of LAD.  Currently on Eliquis 5 mg twice daily, he states that he is not on Plavix anymore, because it was never refilled.  He currently denies any chest pain, palpitations, headache, lightheadedness, dizziness, or any other symptoms.  We discussed the importance of taking Eliquis and Plavix and discussed that if he ran out of these medications that he should either call us or his cardiologist to have these refilled.  -Refill Eliquis 5 mg twice daily and Plavix 75 mg daily

## 2020-11-27 NOTE — Assessment & Plan Note (Signed)
Patient supposed to be on atorvastatin 80 mg daily in setting of CAD.  He states that he has not been taking this medication.  We will resume this medication for now.

## 2020-11-27 NOTE — Patient Instructions (Addendum)
[name] [facility],  It was a pleasure to see you today. Thank you for coming in.   Today we discussed your recent hospitalization. I am glad that you are feeling well.   You are still in the irregular heart rhythm. Please restart your amiodarone daily. Please go to the ER if you start having chest pain, lightheadness, dizziness, nausea, vomiting, headaches, or other symptoms. Please make sure you follow up with the cardiologist this Friday.   You had a stent placed in your heart vessels and it will be important that you take the Eliquis and Plavix daily. Please contact us or your cardiologist if you run out of this medication.  We also discussed your medications. Please restart the atorvastatin. You will need to be on the other medications in the future however since your blood pressure is low we aren't able to start anything at this time. Please bring all your medications with you on all your appointments.   Please return to clinic in 1 week or sooner if needed.   Thank you again for coming in.   [name]

## 2020-11-27 NOTE — Progress Notes (Signed)
CC: Hospital follow up for NSTEMI  HPI:  [name] is a 75 y.o. with history of hypertension, persistent atrial fibrillation, polycythemia vera, COPD, CKD stage III, nonischemic cardiomyopathy, and opioid use disorder who is presenting for a hospital follow-up.  Patient was admitted from 3/20 2-09/2025 for a NSTEMI status post drug-eluting stent of the LAD, atrial fibrillation, heart failure, acute on chronic CKD, hyperlipidemia, thoracic aortic aneurysm, prediabetes, and myeloproliferative disorder.  Patient reports that he is feeling well today, he does endorse some shortness of breath with exertion however states that this is stable.  He denies any chest pain, palpitations, lightheadedness, dizziness, nausea, vomiting, headaches, fevers, chills, or any other symptoms.  He reports that he is only taking Eliquis, Lasix 40 mg once a day, and Suboxone.  He states that he is not taking any other medications, we reviewed his amiodarone, spironolactone, Plavix, Lipitor, carvedilol, Farxiga, and Roflumilast which he states he is not taking, states that he never had them refilled so he did not know he needed to continue them. We discussed his recent hospitalization, discussed the importance of taking the Plavix and Eliquis in regards to the stent that was placed.  We also discussed the importance of taking his other medications to help with his heart rhythm, his cholesterol, and his diagnosis of heart failure.    Past Medical History:  Diagnosis Date  . Acute lower GI bleeding 02/15/2017  . Atrial fibrillation ([place])   . Cardiomyopathy    EF55% 11/14<<35%   . CHF (congestive heart failure) ([place])   . COPD (chronic obstructive pulmonary disease) ([place])   . Difficult airway for intubation    per telephone encounter notation  . Essential hypertension   . GERD (gastroesophageal reflux disease)   . Gout   . Hepatitis    Possible history  . History of atrial flutter    Ablation 2012   . Myeloproliferative neoplasm ([place]) 08/15/2013  . Obesity   . Pneumonia X 1  . Primary polycythemia ([place]) 06/12/2013  . Renal insufficiency   . Substance abuse ([place])    History of alcohol; hx cocaine, heroin, crack use  . Syncope   . Tubular adenoma of colon 08/2014   Review of Systems:   Constitutional: Negative for chills and fever.  Respiratory: Positive for shortness of breath with exertion. Negative for cough, wheezing.  Cardiovascular: Negative for chest pain and leg swelling.  Gastrointestinal: Negative for abdominal pain, nausea and vomiting.  Neurological: Negative for dizziness and headaches.    Physical Exam:  Vitals:   11/27/20 1429  BP: 106/60  Pulse: 85  Temp: 98.1 F (36.7 C)  TempSrc: Oral  SpO2: 97%  Weight: 204 lb 3.2 oz (92.6 kg)  Height: 5\' 6"  (1.676 m)   Physical Exam HENT:     Head: Normocephalic and atraumatic.  Eyes:     Conjunctiva/sclera: Conjunctivae normal.     Pupils: Pupils are equal, round, and reactive to light.  Neck:     Thyroid: No thyromegaly.  Cardiovascular:     Rate and Rhythm: Normal rate. Rhythm irregular.     Heart sounds: Normal heart sounds. No murmur heard. No friction rub. No gallop.   Pulmonary:     Effort: Pulmonary effort is normal. No respiratory distress.     Breath sounds: Normal breath sounds. No wheezing.  Abdominal:     General: Bowel sounds are normal. There is no distension.     Palpations: Abdomen is soft.  Musculoskeletal:  General: Normal range of motion.     Cervical back: Normal range of motion and neck supple.  Skin:    General: Skin is warm and dry.     Findings: No erythema.  Neurological:     Mental Status: He is alert and oriented to person, place, and time.     Gait: Gait is intact.  Psychiatric:        Mood and Affect: Mood and affect normal.      Assessment & Plan:   See Encounters Tab for problem based charting.  Patient discussed with Dr. Philipp Ovens

## 2020-11-27 NOTE — Assessment & Plan Note (Signed)
Has a history of chronic atrial fibrillation and is supposed to be on amiodarone 200 mg daily, and metoprolol 75 mg daily.  He states that he is not taking either of these medications because they were not refilled.  Today his blood pressure is 106/60, heart rate 85, oxygenating well on room air.  On exam he is well-appearing, no acute distress, sitting in chair, cardiac exam showed some irregular rhythm, no murmurs rubs or gallops, pulmonary exam is unremarkable, no lower extremity swelling noted.  We discussed his recent hospitalization, discussed the importance of taking the Plavix and Eliquis in regards to the stent that was placed.  We also discussed the importance of taking his other medications to help with his heart rhythm, his cholesterol, and his diagnosis of heart failure.   Obtained an EKG that showed atrial fibrillation with a heart rate of 106.  Patient appears stable at this time, his blood pressure is soft however he is awake, alert, oriented, and not having any symptoms.  He also has a follow-up with cardiology in 2 days.  It seems reasonable to resume his amiodarone for now and have very close follow-up.  We discussed symptoms to monitor for and advised that if these occur to return to the ED.  -Restart amiodarone 200 mg daily -Follow-up with cardiology in 2 days -Advised patient to bring medications with him to all visits -Follow-up in 1 week

## 2020-11-27 NOTE — Assessment & Plan Note (Signed)
Patient is supposed to be on metoprolol 25 mg daily, Lasix 40 mg daily, spironolactone 12.5 mg daily, and Farxiga 10 mg daily.  He states that he is only taking the Lasix 40 mg daily because the other medications were not refilled.  He does endorse some shortness of breath with exertion, no shortness of breath at rest, denies any chest pain, lightheadedness, dizziness, headaches, or other symptoms.  Blood pressure today is 106/60, heart rate 85 and irregular.  Patient is not on goal-directed medical therapy however restarting his medications is currently limited due to his current blood pressure.  His weight is stable and he does not appear volume overloaded on exam.  -Continue Lasix 40 mg daily -Remove Farxiga, spironolactone, and metoprolol from his medication list for now, these medications can be on added on when his BP allows -Follow-up with cardiology

## 2020-11-27 NOTE — Assessment & Plan Note (Signed)
Repeat CBC today 

## 2020-11-28 ENCOUNTER — Telehealth: Payer: Self-pay | Admitting: *Deleted

## 2020-11-28 LAB — BMP8+ANION GAP
Anion Gap: 19 mmol/L — ABNORMAL HIGH (ref 10.0–18.0)
BUN/Creatinine Ratio: 16 (ref 10–24)
BUN: 24 mg/dL (ref 8–27)
CO2: 21 mmol/L (ref 20–29)
Calcium: 8.8 mg/dL (ref 8.6–10.2)
Chloride: 102 mmol/L (ref 96–106)
Creatinine, Ser: 1.46 mg/dL — ABNORMAL HIGH (ref 0.76–1.27)
Glucose: 96 mg/dL (ref 65–99)
Potassium: 4.3 mmol/L (ref 3.5–5.2)
Sodium: 142 mmol/L (ref 134–144)
eGFR: 50 mL/min/{1.73_m2} — ABNORMAL LOW (ref >59)

## 2020-11-28 LAB — CBC
Hematocrit: 48.7 % (ref 37.5–51.0)
Hemoglobin: 16.3 g/dL (ref 13.0–17.7)
MCH: 30.4 pg (ref 26.6–33.0)
MCHC: 33.5 g/dL (ref 31.5–35.7)
MCV: 91 fL (ref 79–97)
Platelets: 256 10*3/uL (ref 150–450)
RBC: 5.36 x10E6/uL (ref 4.14–5.80)
RDW: 14.4 % (ref 11.6–15.4)
WBC: 9.1 10*3/uL (ref 3.4–10.8)

## 2020-11-28 NOTE — Telephone Encounter (Signed)
Reached out to the patient to ask if he was using his cpap or not and he states no he is not using it at the this time because he sleep well at night. I informed him the sleep doctor recommends he have a split night study. Patient states he sleeps well at night and does not think he needs the cpap neither does he want one because he has one at home that he does not use. I will cancel his 5/13 appt with TT.

## 2020-11-28 NOTE — Telephone Encounter (Signed)
-----   Message from Antonieta Iba, [place] sent at 11/27/2020  2:05 PM EDT ----- Regarding: Schedule Friday 5/13 Hey, do you know anything about this patient? They are scheduled to see Dr. Armanda Heritage on Friday 5/13 as a new sleep patient. It looks likes they had a sleep study in 2020 that showed sleep apnea but they were never started on CPAP. Will Dr. Radford Pax still see the patient or do they need another sleep study first? Thanks,  Carly

## 2020-11-28 NOTE — Telephone Encounter (Signed)
Ryan Margarita, MD  Freada Bergeron, CMA; Antonieta Iba, RN Needs split night sleep study first

## 2020-11-29 ENCOUNTER — Ambulatory Visit: Payer: Medicare Other | Admitting: Cardiology

## 2020-11-29 NOTE — Progress Notes (Signed)
Internal Medicine Clinic Attending ° °Case discussed with Dr. Krienke  At the time of the visit.  We reviewed the resident’s history and exam and pertinent patient test results.  I agree with the assessment, diagnosis, and plan of care documented in the resident’s note.  °

## 2020-12-27 ENCOUNTER — Other Ambulatory Visit: Payer: Self-pay | Admitting: Internal Medicine

## 2021-01-09 ENCOUNTER — Ambulatory Visit: Payer: Medicare Other | Admitting: Internal Medicine

## 2021-01-30 ENCOUNTER — Other Ambulatory Visit: Payer: Self-pay | Admitting: Internal Medicine

## 2021-01-30 DIAGNOSIS — I4891 Unspecified atrial fibrillation: Secondary | ICD-10-CM

## 2021-02-23 ENCOUNTER — Other Ambulatory Visit: Payer: Self-pay | Admitting: Internal Medicine

## 2021-04-25 ENCOUNTER — Observation Stay (HOSPITAL_COMMUNITY)
Admission: EM | Admit: 2021-04-25 | Discharge: 2021-04-26 | Disposition: A | Discharge status: Home / Self Care | Payer: Medicare Other | Attending: Internal Medicine | Admitting: Internal Medicine

## 2021-04-25 ENCOUNTER — Other Ambulatory Visit: Payer: Self-pay

## 2021-04-25 ENCOUNTER — Encounter (HOSPITAL_COMMUNITY): Payer: Self-pay | Admitting: Emergency Medicine

## 2021-04-25 DIAGNOSIS — L03116 Cellulitis of left lower limb: Secondary | ICD-10-CM | POA: Diagnosis not present

## 2021-04-25 DIAGNOSIS — I739 Peripheral vascular disease, unspecified: Secondary | ICD-10-CM | POA: Diagnosis present

## 2021-04-25 DIAGNOSIS — Z7901 Long term (current) use of anticoagulants: Secondary | ICD-10-CM | POA: Insufficient documentation

## 2021-04-25 DIAGNOSIS — Z79899 Other long term (current) drug therapy: Secondary | ICD-10-CM | POA: Insufficient documentation

## 2021-04-25 DIAGNOSIS — F17210 Nicotine dependence, cigarettes, uncomplicated: Secondary | ICD-10-CM | POA: Diagnosis not present

## 2021-04-25 DIAGNOSIS — Z7902 Long term (current) use of antithrombotics/antiplatelets: Secondary | ICD-10-CM | POA: Insufficient documentation

## 2021-04-25 DIAGNOSIS — I4891 Unspecified atrial fibrillation: Secondary | ICD-10-CM

## 2021-04-25 DIAGNOSIS — I5023 Acute on chronic systolic (congestive) heart failure: Secondary | ICD-10-CM | POA: Diagnosis present

## 2021-04-25 DIAGNOSIS — I509 Heart failure, unspecified: Secondary | ICD-10-CM

## 2021-04-25 DIAGNOSIS — I110 Hypertensive heart disease with heart failure: Secondary | ICD-10-CM | POA: Insufficient documentation

## 2021-04-25 DIAGNOSIS — L03115 Cellulitis of right lower limb: Secondary | ICD-10-CM | POA: Diagnosis not present

## 2021-04-25 DIAGNOSIS — Z20822 Contact with and (suspected) exposure to covid-19: Secondary | ICD-10-CM | POA: Insufficient documentation

## 2021-04-25 DIAGNOSIS — I5043 Acute on chronic combined systolic (congestive) and diastolic (congestive) heart failure: Secondary | ICD-10-CM | POA: Diagnosis not present

## 2021-04-25 DIAGNOSIS — J449 Chronic obstructive pulmonary disease, unspecified: Secondary | ICD-10-CM | POA: Insufficient documentation

## 2021-04-25 DIAGNOSIS — I700 Atherosclerosis of aorta: Secondary | ICD-10-CM | POA: Diagnosis present

## 2021-04-25 DIAGNOSIS — R7303 Prediabetes: Secondary | ICD-10-CM | POA: Insufficient documentation

## 2021-04-25 DIAGNOSIS — M7989 Other specified soft tissue disorders: Secondary | ICD-10-CM | POA: Diagnosis present

## 2021-04-25 DIAGNOSIS — S81809A Unspecified open wound, unspecified lower leg, initial encounter: Secondary | ICD-10-CM

## 2021-04-25 DIAGNOSIS — I872 Venous insufficiency (chronic) (peripheral): Secondary | ICD-10-CM | POA: Diagnosis present

## 2021-04-25 LAB — CBC WITH DIFFERENTIAL/PLATELET
Abs Immature Granulocytes: 0.04 10*3/uL (ref 0.00–0.07)
Basophils Absolute: 0.1 10*3/uL (ref 0.0–0.1)
Basophils Relative: 1 %
Eosinophils Absolute: 0.2 10*3/uL (ref 0.0–0.5)
Eosinophils Relative: 2 %
HCT: 45.4 % (ref 39.0–52.0)
Hemoglobin: 14.6 g/dL (ref 13.0–17.0)
Immature Granulocytes: 0 %
Lymphocytes Relative: 10 %
Lymphs Abs: 1.1 10*3/uL (ref 0.7–4.0)
MCH: 31.9 pg (ref 26.0–34.0)
MCHC: 32.2 g/dL (ref 30.0–36.0)
MCV: 99.1 fL (ref 80.0–100.0)
Monocytes Absolute: 0.9 10*3/uL (ref 0.1–1.0)
Monocytes Relative: 8 %
Neutro Abs: 8.2 10*3/uL — ABNORMAL HIGH (ref 1.7–7.7)
Neutrophils Relative %: 79 %
Platelets: 260 10*3/uL (ref 150–400)
RBC: 4.58 MIL/uL (ref 4.22–5.81)
RDW: 15.6 % — ABNORMAL HIGH (ref 11.5–15.5)
WBC: 10.3 10*3/uL (ref 4.0–10.5)
nRBC: 0 % (ref 0.0–0.2)

## 2021-04-25 LAB — COMPREHENSIVE METABOLIC PANEL
ALT: 14 U/L (ref 0–44)
AST: 16 U/L (ref 15–41)
Albumin: 3.1 g/dL — ABNORMAL LOW (ref 3.5–5.0)
Alkaline Phosphatase: 106 U/L (ref 38–126)
Anion gap: 10 (ref 5–15)
BUN: 17 mg/dL (ref 8–23)
CO2: 27 mmol/L (ref 22–32)
Calcium: 8.6 mg/dL — ABNORMAL LOW (ref 8.9–10.3)
Chloride: 104 mmol/L (ref 98–111)
Creatinine, Ser: 1.43 mg/dL — ABNORMAL HIGH (ref 0.61–1.24)
GFR, Estimated: 52 mL/min — ABNORMAL LOW (ref >60)
Glucose, Bld: 133 mg/dL — ABNORMAL HIGH (ref 70–99)
Potassium: 4 mmol/L (ref 3.5–5.1)
Sodium: 141 mmol/L (ref 135–145)
Total Bilirubin: 1.1 mg/dL (ref 0.3–1.2)
Total Protein: 6.8 g/dL (ref 6.5–8.1)

## 2021-04-25 LAB — BRAIN NATRIURETIC PEPTIDE: B Natriuretic Peptide: 490.1 pg/mL — ABNORMAL HIGH (ref 0.0–100.0)

## 2021-04-25 NOTE — ED Notes (Signed)
Have patient call his wife

## 2021-04-25 NOTE — ED Triage Notes (Signed)
Pt with bilateral leg sore with purulent/clear fluid drainage. Pt with hx of CHF. Denies CP/SOB. VSS. NAD at present.

## 2021-04-25 NOTE — ED Provider Notes (Signed)
Emergency Medicine Provider Triage Evaluation Note  [name] , a 75 y.o. male  was evaluated in triage.  Pt complains of month of swelling in his lower extremities.  He has had multiple wounds and oozing.  Pain is increased over the past week and he came in for evaluation.  Denies fevers or chills.  Patient states he is not sure if he is diabetic  Review of Systems  Positive: Leg pain and swelling Negative: fever Physical Exam  BP (!) 150/68 (BP Location: Left Arm)   Pulse 72   Temp 98.5 F (36.9 C) (Oral)   Resp 18   Ht 5\' 6"  (1.676 m)   Wt 90.7 kg   SpO2 92%   BMI 32.28 kg/m  Gen:   Awake, no distress   Resp:  Normal effort  MSK:   Moves extremities without difficulty  Other:  fever  Medical Decision Making  Medically screening exam initiated at 11:23 AM.  Appropriate orders placed.  [name] was informed that the remainder of the evaluation will be completed by another provider, this initial triage assessment does not replace that evaluation, and the importance of remaining in the ED until their evaluation is complete.  Work up [employer], [place], PA-C 04/25/21 1128    Regan Lemming, MD 04/25/21 1952

## 2021-04-26 ENCOUNTER — Observation Stay (HOSPITAL_BASED_OUTPATIENT_CLINIC_OR_DEPARTMENT_OTHER): Payer: Medicare Other

## 2021-04-26 ENCOUNTER — Emergency Department (HOSPITAL_COMMUNITY): Payer: Medicare Other

## 2021-04-26 DIAGNOSIS — I5023 Acute on chronic systolic (congestive) heart failure: Secondary | ICD-10-CM | POA: Diagnosis not present

## 2021-04-26 DIAGNOSIS — I509 Heart failure, unspecified: Secondary | ICD-10-CM

## 2021-04-26 DIAGNOSIS — S81809A Unspecified open wound, unspecified lower leg, initial encounter: Secondary | ICD-10-CM | POA: Insufficient documentation

## 2021-04-26 DIAGNOSIS — I700 Atherosclerosis of aorta: Secondary | ICD-10-CM

## 2021-04-26 DIAGNOSIS — L0390 Cellulitis, unspecified: Secondary | ICD-10-CM

## 2021-04-26 DIAGNOSIS — I872 Venous insufficiency (chronic) (peripheral): Secondary | ICD-10-CM | POA: Diagnosis not present

## 2021-04-26 DIAGNOSIS — I739 Peripheral vascular disease, unspecified: Secondary | ICD-10-CM | POA: Diagnosis present

## 2021-04-26 DIAGNOSIS — L03116 Cellulitis of left lower limb: Secondary | ICD-10-CM | POA: Diagnosis not present

## 2021-04-26 LAB — CBC
HCT: 42.8 % (ref 39.0–52.0)
Hemoglobin: 13.6 g/dL (ref 13.0–17.0)
MCH: 31.6 pg (ref 26.0–34.0)
MCHC: 31.8 g/dL (ref 30.0–36.0)
MCV: 99.3 fL (ref 80.0–100.0)
Platelets: 246 10*3/uL (ref 150–400)
RBC: 4.31 MIL/uL (ref 4.22–5.81)
RDW: 15.7 % — ABNORMAL HIGH (ref 11.5–15.5)
WBC: 9.8 10*3/uL (ref 4.0–10.5)
nRBC: 0 % (ref 0.0–0.2)

## 2021-04-26 LAB — BASIC METABOLIC PANEL
Anion gap: 9 (ref 5–15)
BUN: 19 mg/dL (ref 8–23)
CO2: 27 mmol/L (ref 22–32)
Calcium: 8.2 mg/dL — ABNORMAL LOW (ref 8.9–10.3)
Chloride: 102 mmol/L (ref 98–111)
Creatinine, Ser: 1.51 mg/dL — ABNORMAL HIGH (ref 0.61–1.24)
GFR, Estimated: 48 mL/min — ABNORMAL LOW (ref >60)
Glucose, Bld: 92 mg/dL (ref 70–99)
Potassium: 3.9 mmol/L (ref 3.5–5.1)
Sodium: 138 mmol/L (ref 135–145)

## 2021-04-26 LAB — URINALYSIS, ROUTINE W REFLEX MICROSCOPIC
Bacteria, UA: NONE SEEN
Bilirubin Urine: NEGATIVE
Glucose, UA: NEGATIVE mg/dL
Ketones, ur: NEGATIVE mg/dL
Leukocytes,Ua: NEGATIVE
Nitrite: NEGATIVE
Protein, ur: NEGATIVE mg/dL
Specific Gravity, Urine: 1.006 (ref 1.005–1.030)
pH: 5 (ref 5.0–8.0)

## 2021-04-26 LAB — TROPONIN I (HIGH SENSITIVITY)
Troponin I (High Sensitivity): 17 ng/L (ref <18)
Troponin I (High Sensitivity): 19 ng/L — ABNORMAL HIGH (ref <18)

## 2021-04-26 LAB — HEMOGLOBIN A1C
Hgb A1c MFr Bld: 6 % — ABNORMAL HIGH (ref 4.8–5.6)
Mean Plasma Glucose: 125.5 mg/dL

## 2021-04-26 LAB — CBG MONITORING, ED: Glucose-Capillary: 89 mg/dL (ref 70–99)

## 2021-04-26 LAB — C-REACTIVE PROTEIN: CRP: 2.6 mg/dL — ABNORMAL HIGH (ref <1.0)

## 2021-04-26 LAB — BRAIN NATRIURETIC PEPTIDE: B Natriuretic Peptide: 529.5 pg/mL — ABNORMAL HIGH (ref 0.0–100.0)

## 2021-04-26 LAB — SEDIMENTATION RATE: Sed Rate: 12 mm/hr (ref 0–16)

## 2021-04-26 LAB — LACTIC ACID, PLASMA: Lactic Acid, Venous: 1.4 mmol/L (ref 0.5–1.9)

## 2021-04-26 LAB — RESP PANEL BY RT-PCR (FLU A&B, COVID) ARPGX2
Influenza A by PCR: NEGATIVE
Influenza B by PCR: NEGATIVE
SARS Coronavirus 2 by RT PCR: NEGATIVE

## 2021-04-26 MED ORDER — AMIODARONE HCL 200 MG PO TABS
200.0000 mg | ORAL_TABLET | Freq: Every day | ORAL | Status: DC
  Administered 2021-04-26: 200 mg via ORAL
  Filled 2021-04-26: qty 1

## 2021-04-26 MED ORDER — FUROSEMIDE 10 MG/ML IJ SOLN
60.0000 mg | Freq: Once | INTRAMUSCULAR | Status: AC
  Administered 2021-04-26: 60 mg via INTRAVENOUS
  Filled 2021-04-26: qty 6

## 2021-04-26 MED ORDER — APIXABAN 5 MG PO TABS
5.0000 mg | ORAL_TABLET | Freq: Two times a day (BID) | ORAL | Status: DC
  Administered 2021-04-26: 5 mg via ORAL
  Filled 2021-04-26: qty 1

## 2021-04-26 MED ORDER — ACETAMINOPHEN 325 MG PO TABS: 650.0000 mg | ORAL_TABLET | Freq: Four times a day (QID) | ORAL | Status: DC | PRN

## 2021-04-26 MED ORDER — ATORVASTATIN CALCIUM 40 MG PO TABS
80.0000 mg | ORAL_TABLET | Freq: Every day | ORAL | Status: DC
Start: 2021-04-26 — End: 2021-04-26
  Administered 2021-04-26: 80 mg via ORAL
  Filled 2021-04-26: qty 2

## 2021-04-26 MED ORDER — CLOPIDOGREL BISULFATE 75 MG PO TABS: 75.0000 mg | ORAL_TABLET | Freq: Every day | ORAL | 5 refills | Status: DC

## 2021-04-26 MED ORDER — MOMETASONE FURO-FORMOTEROL FUM 200-5 MCG/ACT IN AERO
2.0000 | INHALATION_SPRAY | Freq: Two times a day (BID) | RESPIRATORY_TRACT | Status: DC
  Administered 2021-04-26: 2 via RESPIRATORY_TRACT
  Filled 2021-04-26: qty 8.8

## 2021-04-26 MED ORDER — VANCOMYCIN HCL 1500 MG/300ML IV SOLN
1500.0000 mg | Freq: Once | INTRAVENOUS | Status: AC
  Administered 2021-04-26: 1500 mg via INTRAVENOUS
  Filled 2021-04-26: qty 300

## 2021-04-26 MED ORDER — ASPIRIN 81 MG PO CHEW: 81.0000 mg | CHEWABLE_TABLET | Freq: Every day | ORAL | Status: DC

## 2021-04-26 MED ORDER — CLOPIDOGREL BISULFATE 75 MG PO TABS
75.0000 mg | ORAL_TABLET | Freq: Every day | ORAL | Status: DC
  Administered 2021-04-26: 75 mg via ORAL
  Filled 2021-04-26: qty 1

## 2021-04-26 MED ORDER — BUPRENORPHINE HCL-NALOXONE HCL 8-2 MG SL SUBL
2.0000 | SUBLINGUAL_TABLET | Freq: Every day | SUBLINGUAL | Status: DC
Start: 2021-04-26 — End: 2021-04-26
  Administered 2021-04-26: 2 via SUBLINGUAL
  Filled 2021-04-26 (×2): qty 2

## 2021-04-26 MED ORDER — FUROSEMIDE 10 MG/ML IJ SOLN
40.0000 mg | Freq: Once | INTRAMUSCULAR | Status: AC
  Administered 2021-04-26: 40 mg via INTRAVENOUS
  Filled 2021-04-26: qty 4

## 2021-04-26 MED ORDER — SYMBICORT 160-4.5 MCG/ACT IN AERO: 2.0000 | INHALATION_SPRAY | Freq: Every day | RESPIRATORY_TRACT | 0 refills | Status: DC

## 2021-04-26 MED ORDER — FUROSEMIDE 40 MG PO TABS: 40.0000 mg | ORAL_TABLET | Freq: Every day | ORAL | 0 refills | Status: DC

## 2021-04-26 MED ORDER — APIXABAN 5 MG PO TABS: 5.0000 mg | ORAL_TABLET | Freq: Two times a day (BID) | ORAL | 0 refills | Status: AC

## 2021-04-26 MED ORDER — ASPIRIN 81 MG PO CHEW: 81.0000 mg | CHEWABLE_TABLET | Freq: Every day | ORAL | 0 refills | Status: DC

## 2021-04-26 MED ORDER — ATORVASTATIN CALCIUM 80 MG PO TABS: 80.0000 mg | ORAL_TABLET | Freq: Every day | ORAL | 5 refills | Status: AC

## 2021-04-26 MED ORDER — IPRATROPIUM-ALBUTEROL 0.5-2.5 (3) MG/3ML IN SOLN: 3.0000 mL | RESPIRATORY_TRACT | Status: DC | PRN

## 2021-04-26 MED ORDER — ACETAMINOPHEN 650 MG RE SUPP: 650.0000 mg | Freq: Four times a day (QID) | RECTAL | Status: DC | PRN

## 2021-04-26 MED ORDER — AMIODARONE HCL 200 MG PO TABS: 200.0000 mg | ORAL_TABLET | Freq: Every day | ORAL | 0 refills | Status: DC

## 2021-04-26 NOTE — Hospital Course (Signed)
Ch and copd Worsening lower extremity pain, swelling, and drainage,  Good pulses, no fever, no hypoxia, no cp no increased work of breathing Labs fine, no wbc

## 2021-04-26 NOTE — Discharge Summary (Signed)
Name: Adolpho Meenach MRN: [id number] DOB: 05/04/1946 75 y.o. PCP: Marianna Payment, MD  Date of Admission: 04/25/2021 11:14 AM Date of Discharge: 04/26/2021 Attending Physician: Lucious Groves, DO  Discharge Diagnosis: 1. Chronic venous stasis dermatitis, bilateral 2. HFrEF 2/2 Ischemic cardiomyopathy 3. Peripheral Arterial Disease 4. Coronary Artery Disease s/p LAD DES 5. COPD  6. Atrial Fibrillation   Discharge Medications: Allergies as of 04/26/2021   No Known Allergies      Medication List     TAKE these medications    acetaminophen 500 MG tablet Commonly known as: TYLENOL Take 1,000 mg by mouth every 6 (six) hours as needed for mild pain.   albuterol 108 (90 Base) MCG/ACT inhaler Commonly known as: ProAir HFA Inhale 1-2 puffs into the lungs every 6 (six) hours as needed for wheezing or shortness of breath.   amiodarone 200 MG tablet Commonly known as: PACERONE Take 1 tablet (200 mg total) by mouth daily. What changed: See the new instructions.   apixaban 5 MG Tabs tablet Commonly known as: Eliquis Take 1 tablet (5 mg total) by mouth 2 (two) times daily.   aspirin 81 MG chewable tablet Chew 1 tablet (81 mg total) by mouth daily. Start taking on: April 27, 2021   atorvastatin 80 MG tablet Commonly known as: LIPITOR Take 1 tablet (80 mg total) by mouth daily.   camphor-menthol lotion Commonly known as: SARNA Apply topically 3 (three) times daily. What changed:  how much to take when to take this reasons to take this   clopidogrel 75 MG tablet Commonly known as: PLAVIX Take 1 tablet (75 mg total) by mouth daily with breakfast.   furosemide 40 MG tablet Commonly known as: LASIX Take 1 tablet (40 mg total) by mouth daily.   Suboxone 8-2 MG Film Generic drug: Buprenorphine HCl-Naloxone HCl Place 2 Film under the tongue daily.   Symbicort 160-4.5 MCG/ACT inhaler Generic drug: budesonide-formoterol Inhale 2 puffs into the lungs at  bedtime. What changed: how to take this        Disposition and follow-up:   Mr.Jemario Leonard Feigel was discharged from [facility] in Good condition.  At the hospital follow up visit please address:  1.    Assess volume status for further titration of Lasix if needed Please assess lower extremity wounds for any signs of infection Place referral for Vascular Surgery due to abnormal ABIs  2.  Labs / imaging needed at time of follow-up: BMP  3.  Pending labs/ test needing follow-up: None  Follow-up Appointments: PCP Cardiology   Hospital Course by problem list: 1. Chronic venous stasis dermatitis, bilateral Patient presented to the ED with complaints of lower extremity swelling and drainage from multiple wounds that had been ongoing.  He notes that this was in the setting of running out of Lasix.  While in the ED, patient initially received broad-spectrum antibiotics.  On further examination, patient's wounds most consistent with venous stasis dermatitis rather than cellulitis.  Diuresis was pursued with improvement in pain.  Due to abnormal ABIs, compression therapy was not pursued.  2. HFrEF 2/2 Ischemic cardiomyopathy Patient has a past medical history of HFrEF with EF of 45%.  He is currently on Lasix 40 mg once daily.  He is on no guideline directed medical therapy due to medication noncompliance.  On admission, he states he had been out of Lasix for at least 1 week and was noted to be hypervolemic.  IV diuresis was pursued with improvement  in volume status.  Outpatient follow-up recommended for further titration of Lasix.  3. Peripheral Arterial Disease On examination, patient had difficult to palpate pulses.  ABIs were obtained and bilateral lower extremity arteries were noncompressible.  TBI's were consistent with PAD bilaterally.  Recommend outpatient referral to vascular surgery.  4. Coronary Artery Disease s/p LAD DES Patient had a drug-eluting stent  placed to the LAD in March 2022 at which time he was instructed to take aspirin and Plavix for at least 12 months.  He states he is currently only taking Plavix and has not been taking aspirin.  He was instructed to restart aspirin and schedule follow-up visit with cardiology.  5. COPD  Mild wheezing on presentation, however no signs of COPD exacerbation.  Home inhalers were refilled to the pharmacy.  6. Atrial Fibrillation  On admission, patient stated he has been out of Eliquis for at least 1 week.  Refill sent to the pharmacy.   Pertinent Labs, Studies, and Procedures:  BMP Latest Ref Rng & Units 04/26/2021 04/25/2021 11/27/2020  Glucose 70 - 99 mg/dL 92 133(H) 96  BUN 8 - 23 mg/dL 19 17 24   Creatinine 0.61 - 1.24 mg/dL 1.51(H) 1.43(H) 1.46(H)  BUN/Creat Ratio 10 - 24 - - 16  Sodium 135 - 145 mmol/L 138 141 142  Potassium 3.5 - 5.1 mmol/L 3.9 4.0 4.3  Chloride 98 - 111 mmol/L 102 104 102  CO2 22 - 32 mmol/L 27 27 21   Calcium 8.9 - 10.3 mg/dL 8.2(L) 8.6(L) 8.8   CBC Latest Ref Rng & Units 04/26/2021 04/25/2021 11/27/2020  WBC 4.0 - 10.5 K/uL 9.8 10.3 9.1  Hemoglobin 13.0 - 17.0 g/dL 13.6 14.6 16.3  Hematocrit 39.0 - 52.0 % 42.8 45.4 48.7  Platelets 150 - 400 K/uL 246 260 256   VAS [place] ABI WITH/WO TBI  LOWER EXTREMITY DOPPLER STUDY  Patient Name:  MARLEN MOLLICA [facility]  Date of Exam:   04/26/2021 Medical Rec #: [id number]                 Accession #:    [id number] Date of Birth: April 30, 1946                Patient Gender: M Patient Age:   47 years Exam Location:  [facility] Procedure:      VAS [place] ABI WITH/WO TBI Referring Phys: Ezequiel Essex  --------------------------------------------------------------------------------   Indications: Weak pulses by palpation. Weeping venous stasis wounds,              cellulitis.  High Risk Factors: Hypertension, hyperlipidemia, current smoker.  Other Factors: Patient out of Lasix X 1 week.  Comparison Study: No prior  study  Performing Technologist: Sharion Dove RVS    Examination Guidelines: A complete evaluation includes at minimum, Doppler waveform signals and systolic blood pressure reading at the level of bilateral brachial, anterior tibial, and posterior tibial arteries, when vessel segments are accessible. Bilateral testing is considered an integral part of a complete examination. Photoelectric Plethysmograph (PPG) waveforms and toe systolic pressure readings are included as required and additional duplex testing as needed. Limited examinations for reoccurring indications may be performed as noted.    ABI Findings: +---------+------------------+-----+-----------+--------+ Right    Rt Pressure (mmHg)IndexWaveform   Comment  +---------+------------------+-----+-----------+--------+ Brachial 122                    multiphasic         +---------+------------------+-----+-----------+--------+ PTA      93  0.76 biphasic            +---------+------------------+-----+-----------+--------+ DP       254               2.08 biphasic            +---------+------------------+-----+-----------+--------+ Great Toe53                0.43                     +---------+------------------+-----+-----------+--------+  +---------+------------------+-----+-----------+-------+ Left     Lt Pressure (mmHg)IndexWaveform   Comment +---------+------------------+-----+-----------+-------+ Brachial 121                    multiphasic        +---------+------------------+-----+-----------+-------+ PTA      188               1.54 biphasic           +---------+------------------+-----+-----------+-------+ DP       254               2.08 biphasic           +---------+------------------+-----+-----------+-------+ Great Toe60                0.49                     +---------+------------------+-----+-----------+-------+  +-------+----------------+-----------+------------+------------+ ABI/TBIToday's ABI     Today's TBIPrevious ABIPrevious TBI +-------+----------------+-----------+------------+------------+ Right  non compressible0.43                                +-------+----------------+-----------+------------+------------+ Left   Non compressible0.49                                +-------+----------------+-----------+------------+------------+       Summary: Right: Resting right ankle-brachial index indicates noncompressible right lower extremity arteries. The right toe-brachial index is abnormal.  Left: Resting left ankle-brachial index indicates noncompressible left lower extremity arteries. The left toe-brachial index is abnormal.    *See table(s) above for measurements and observations.         Preliminary   DG Chest Portable 1 View CLINICAL DATA:  Shortness of breath  EXAM: PORTABLE CHEST 1 VIEW  COMPARISON:  10/08/2020  FINDINGS: Cardiac enlargement. No vascular congestion, edema, or consolidation. No definite pleural effusion or pneumothorax. Mediastinal contours appear intact. Calcified and tortuous aorta. Loop recorder. Degenerative changes in the shoulders.  IMPRESSION: Cardiac enlargement.  No vascular congestion or edema.  Electronically Signed   By: Lucienne Capers M.D.   On: 04/26/2021 02:52  Discharge Instructions: Discharge Instructions     (HEART FAILURE PATIENTS) Call MD:  Anytime you have any of the following symptoms: 1) 3 pound weight gain in 24 hours or 5 pounds in 1 week 2) shortness of breath, with or without a dry hacking cough 3) swelling in the hands, feet or stomach 4) if you have to sleep on extra pillows at night in order to breathe.   Complete by: As directed    Call MD for:  difficulty breathing, headache or visual disturbances   Complete by: As directed    Call MD  for:  persistant dizziness or light-headedness   Complete by: As directed    Call MD for:  severe uncontrolled pain   Complete by: As directed  Call MD for:  temperature >100.4   Complete by: As directed    Diet - low sodium heart healthy   Complete by: As directed    Discharge instructions   Complete by: As directed    Mr. Maricela Curet were admitted to the hospital for swelling in your legs that cause the wounds to start weeping. This is called Venous Stasis Dermatitis, and it can occur when there is too much fluid in your legs. It will be very important to start taking Lasix every day again. If you notice that you are still gaining weight on the Lasix, or that your leg swelling is not improving, call your primary care doctor immediately.   While here, we checked the arteries in your legs and found that they may not be pumping blood well enough to help your wounds heal. You will need a referral to Vascular Surgery for this. At your follow up appointment, this can be arranged.   Please restart your daily Aspirin, in addition to continuing Plavix.   Make sure to call your heart doctor and set up an appointment as soon as possible.     It was a pleasure meeting you and we wish you the best!  Dr. Charleen Kirks   Increase activity slowly   Complete by: As directed       Signed: Dr. Jose Persia Internal Medicine PGY-3  Pager: [phone] After 5pm on weekdays and 1pm on weekends: On Call pager [phone]  04/26/2021, 3:18 PM

## 2021-04-26 NOTE — Progress Notes (Signed)
Subjective:   [name] states that he is feeling good today.  He denies any shortness of breath or orthopnea.  He endorses good urine output overnight since receiving IV Lasix.  He is hoping to go home sooner than later.  We discussed any potential barriers to regular outpatient follow-up.  He denies any financial or transportation related barriers at this time.  I encouraged that he have regular follow-up with not only his PCP but also reestablish with cardiology.  Objective:  Vital signs in last 24 hours: Vitals:   04/25/21 2241 04/26/21 0157 04/26/21 0359 04/26/21 0445  BP:  (!) 129/108 127/68 125/64  Pulse:  71 68 65  Resp: 18 18 17  (!) 24  Temp:      TempSrc:      SpO2:  97% 96% 96%  Weight:      Height:       Physical Exam Vitals and nursing note reviewed.  Constitutional:      General: He is not in acute distress.    Appearance: He is obese.  HENT:     Head: Normocephalic and atraumatic.     Mouth/Throat:     Mouth: Mucous membranes are moist.     Pharynx: Oropharynx is clear.  Cardiovascular:     Rate and Rhythm: Normal rate and regular rhythm.  Pulmonary:     Effort: Pulmonary effort is normal. No respiratory distress.     Breath sounds: Wheezing (occasional expiratory wheeze) and rales (minimal bibasilar) present.  Abdominal:     General: There is no distension.     Palpations: Abdomen is soft.     Tenderness: There is no abdominal tenderness. There is no guarding.  Musculoskeletal:     Right lower leg: Edema (2+ pitting) present.     Left lower leg: Edema (2+ pitting) present.     Comments: Sacral edema present  Skin:    General: Skin is warm and dry.  Neurological:     General: No focal deficit present.     Mental Status: He is alert and oriented to person, place, and time. Mental status is at baseline.  Psychiatric:        Mood and Affect: Mood normal.        Behavior: Behavior normal.   Assessment/Plan:  Active Problems:   Cellulitis  #  Chronic Venous Stasis Dermatitis, bilateral  In the setting of diuretic noncompliance leading to significant lower extremity pitting edema.  Wounds on lower extremity are small and weeping clear serous fluid.  No evidence of cellulitis at this time.  No indication for antibiotic therapy.  ABIs were obtained and due to suspected PAD, compression therapy cannot be pursued at this time.  # PAD Pulses cannot be palpated on examination.  ABIs were obtained and demonstrate noncompressible lower extremity arteries.  TBI's are 0.43 on the right and 1.49 on the left consistent with PAD.    - Will need outpatient referral to vascular surgery.  # HFrEF # Ischemic Cardiomyopathy   Patient has been out of Lasix for at least 1 week.  1 dose of IV Lasix overnight with moderate diuresis.  We will give an additional IV dose before discharge.  Strongly encourage patient to follow-up with PCP and cardiology.  - One-time dose of IV Lasix 60 mg - Resume home Lasix tomorrow  # CAD s/p PAD DES Per chart review, patient has only been taking Plavix and has not followed up with cardiology.  Like  - Restart aspirin  81 mg daily - Continue Plavix  - Continue Atorvastatin  # COPD - Resume home inhalers on discharge  # A. Fib  - Resume home amiodarone and Eliquis  Prior to Admission Living Arrangement: Home Anticipated Discharge Location: Home   Dispo: Anticipated discharge in approximately 0-1 day(s).   Dr. Jose Persia Internal Medicine PGY-3  Pager: [phone] After 5pm on weekdays and 1pm on weekends: On Call pager [phone]  04/26/2021, 7:11 AM

## 2021-04-26 NOTE — ED Notes (Signed)
The pts iv infiltrated with vancomycin that is still running.  Iv removed warm compress to the infiltrated  area

## 2021-04-26 NOTE — H&P (Signed)
Date: 04/26/2021               Patient Name:  [name] MRN: [id number]  DOB: 11/24/1945 Age / Sex: 75 y.o., male   PCP: Marianna Payment, MD         Medical Service: Internal Medicine Teaching Service         Attending Physician: Dr. Lucious Groves, DO    First Contact: Idamae Schuller Pager: [phone]  Second Contact: Jose Persia, MD Pager: IB [phone]       After Hours (After 5p/  First Contact Pager: [phone]  weekends / holidays): Second Contact Pager: [phone]   SUBJECTIVE   Chief Complaint: wound check  History of Present Illness: Patient is a 75 year old male history of A. fib on Eliquis CHF COPD hypertension polycythemia renal insufficiency.  He presented with complaints of lower extremity swelling, difficulty with ambulation secondary to pain, and drainage drainage from multiple open wounds on his bilateral lower extremities.  He reports a clear drainage from these wounds, denies any purulent appearing drainage.  Reports pain in his bilateral lower extremities that he describes as burning.  Patient has not had his Lasix for several days and has been out of his Eliquis for 1 week as he has not had refills.  Patient does endorse orthopnea, sleep with pillows propping him up, as well as dyspnea on exertion with limited ability to ambulate more than several yards, however this has been unchanged and stable for approximately the past year.  No falls or trauma.  No fever or chills.  No difficulty breathing.  No chest pain.  ED Course: While in the emergency department chest x-ray was obtained which showed cardiomegaly however no signs of central pulmonary vascular congestion.  BNP was noted to be elevated 400s.  Troponin reassuring remained flat.  Sed rate and lactic acid within normal limits.  WBC 10.  He has been afebrile vital signs of been stable.  Internal medicine was consulted for admission for wound check.  Meds:  No outpatient medications have been marked as taking  for the 04/25/21 encounter [facility] Encounter).    Past Medical History:  Diagnosis Date   Acute lower GI bleeding 02/15/2017   Atrial fibrillation (HCC)    Cardiomyopathy    EF55% 11/14<<35%    CHF (congestive heart failure) (HCC)    COPD (chronic obstructive pulmonary disease) ([place])    Difficult airway for intubation    per telephone encounter notation   Essential hypertension    GERD (gastroesophageal reflux disease)    Gout    Hepatitis    Possible history   History of atrial flutter    Ablation 2012   Myeloproliferative neoplasm ([place]) 08/15/2013   Obesity    Pneumonia X 1   Primary polycythemia ([place]) 06/12/2013   Renal insufficiency    Substance abuse ([place])    History of alcohol; hx cocaine, heroin, crack use   Syncope    Tubular adenoma of colon 08/2014    Past Surgical History:  Procedure Laterality Date   CARDIAC ELECTROPHYSIOLOGY MAPPING AND ABLATION  08/2010   [name] 09/07/2010 (12/14/2012)   COLONOSCOPY     15-20 years ago had colon in [place] N/A 02/12/2020   Procedure: COLONOSCOPY WITH PROPOFOL;  Surgeon: Ladene Artist, MD;  Location: [facility] ENDOSCOPY;  Service: Endoscopy;  Laterality: N/A;   CORONARY STENT INTERVENTION N/A 10/10/2020   Procedure: CORONARY STENT INTERVENTION;  Surgeon: Lorretta Harp, MD;  Location: [facility]  INVASIVE CV LAB;  Service: Cardiovascular;  Laterality: N/A;   EXCISIONAL HEMORRHOIDECTOMY  1970's   LOOP RECORDER IMPLANT N/A 08/23/2013   Procedure: LOOP RECORDER IMPLANT;  Surgeon: Deboraha Sprang, MD;  Location: [facility] CATH LAB;  Service: Cardiovascular;  Laterality: N/A;   MULTIPLE EXTRACTIONS WITH ALVEOLOPLASTY Bilateral 01/24/2016   Procedure: MULTIPLE EXTRACTION WITH ALVEOLOPLASTY BILATERAL;  Surgeon: Diona Browner, DDS;  Location: [place];  Service: Oral Surgery;  Laterality: Bilateral;   MULTIPLE TOOTH EXTRACTIONS  01/24/2016   MULTIPLE EXTRACTION WITH ALVEOLOPLASTY BILATERAL (Bilateral)   POLYPECTOMY  02/12/2020    Procedure: POLYPECTOMY;  Surgeon: Ladene Artist, MD;  Location: [facility] ENDOSCOPY;  Service: Endoscopy;;   RIGHT/LEFT HEART CATH AND CORONARY ANGIOGRAPHY N/A 10/09/2020   Procedure: RIGHT/LEFT HEART CATH AND CORONARY ANGIOGRAPHY;  Surgeon: Burnell Blanks, MD;  Location: [place] CV LAB;  Service: Cardiovascular;  Laterality: N/A;    Social:  Lives With: Wife and 2 grandchildren Occupation: Retired Support: Family Level of Function: Independent PCP: Marianna Payment MD Substances: Tobacco use 1 to 2 cigarettes/day drinks about a pint of hard liquor per week  Family History: None  Allergies: Allergies as of 04/25/2021   (No Known Allergies)    Review of Systems: A complete ROS was negative except as per HPI.   OBJECTIVE:   Physical Exam: Blood pressure 127/68, pulse 68, temperature 98.5 F (36.9 C), temperature source Oral, resp. rate 17, height 5\' 6"  (1.676 m), weight 90.7 kg, SpO2 96 %.  Constitutional: Pleasant and cooperative, well-appearing  sitting in bed, in no acute distress HENT: normocephalic atraumatic, mucous membranes moist Eyes: conjunctiva non-erythematous Neck: supple Cardiovascular: JVD noted to the angle of the mandible regular lower extremity edema bilaterally, lower extremity pedal pulses difficult to palpate and feet are cool to the touch, regular rate and rhythm Pulmonary/Chest: normal work of breathing on room air,  wheezes bilaterally saturating well on room air Abdominal: soft, non-tender, distended MSK: normal bulk and tone.  Nontender to calf squeeze, lower extremities appear to be roughly equal diameter Neurological: alert & oriented x 3, 5/5 strength in bilateral upper and lower extremities, normal gait Skin: Skin appears taut edema and erythema noted bilaterally with multiple open weeping wounds straw-colored nonpurulent drainage noted within the area of erythema.  Skin is not raised at area of erythema. Psych: Behavior and mood  appropriate  Labs: CBC    Component Value Date/Time   WBC 10.3 04/25/2021 1130   RBC 4.58 04/25/2021 1130   HGB 14.6 04/25/2021 1130   HGB 16.3 11/27/2020 1515   HGB 13.7 07/23/2017 0837   HCT 45.4 04/25/2021 1130   HCT 48.7 11/27/2020 1515   HCT 39.3 07/23/2017 0837   PLT 260 04/25/2021 1130   PLT 256 11/27/2020 1515   MCV 99.1 04/25/2021 1130   MCV 91 11/27/2020 1515   MCV 125.2 (H) 07/23/2017 0837   MCH 31.9 04/25/2021 1130   MCHC 32.2 04/25/2021 1130   RDW 15.6 (H) 04/25/2021 1130   RDW 14.4 11/27/2020 1515   RDW 13.5 07/23/2017 0837   LYMPHSABS 1.1 04/25/2021 1130   LYMPHSABS 1.2 07/23/2017 0837   MONOABS 0.9 04/25/2021 1130   MONOABS 0.3 07/23/2017 0837   EOSABS 0.2 04/25/2021 1130   EOSABS 0.1 07/23/2017 0837   BASOSABS 0.1 04/25/2021 1130   BASOSABS 0.0 07/23/2017 0837     CMP     Component Value Date/Time   NA 141 04/25/2021 1130   NA 142 11/27/2020 1515   NA 143 06/12/2013  1125   K 4.0 04/25/2021 1130   K 3.7 06/12/2013 1125   CL 104 04/25/2021 1130   CO2 27 04/25/2021 1130   CO2 32 (H) 06/12/2013 1125   GLUCOSE 133 (H) 04/25/2021 1130   GLUCOSE 92 06/12/2013 1125   BUN 17 04/25/2021 1130   BUN 24 11/27/2020 1515   BUN 14.6 06/12/2013 1125   CREATININE 1.43 (H) 04/25/2021 1130   CREATININE 1.05 08/30/2014 1628   CREATININE 0.9 06/12/2013 1125   CALCIUM 8.6 (L) 04/25/2021 1130   CALCIUM 8.9 06/12/2013 1125   PROT 6.8 04/25/2021 1130   PROT 7.5 07/16/2020 1357   PROT 6.9 06/12/2013 1125   ALBUMIN 3.1 (L) 04/25/2021 1130   ALBUMIN 3.8 07/16/2020 1357   ALBUMIN 3.3 (L) 06/12/2013 1125   AST 16 04/25/2021 1130   AST 21 06/12/2013 1125   ALT 14 04/25/2021 1130   ALT 28 06/12/2013 1125   ALKPHOS 106 04/25/2021 1130   ALKPHOS 112 06/12/2013 1125   BILITOT 1.1 04/25/2021 1130   BILITOT 0.4 07/16/2020 1357   BILITOT 0.82 06/12/2013 1125   GFRNONAA 52 (L) 04/25/2021 1130   GFRNONAA 73 08/30/2014 1628   GFRAA 61 07/16/2020 1357   GFRAA 84  08/30/2014 1628    Imaging: DG Chest Portable 1 View  Result Date: 04/26/2021 CLINICAL DATA:  Shortness of breath EXAM: PORTABLE CHEST 1 VIEW COMPARISON:  10/08/2020 FINDINGS: Cardiac enlargement. No vascular congestion, edema, or consolidation. No definite pleural effusion or pneumothorax. Mediastinal contours appear intact. Calcified and tortuous aorta. Loop recorder. Degenerative changes in the shoulders. IMPRESSION: Cardiac enlargement.  No vascular congestion or edema. Electronically Signed   By: Lucienne Capers M.D.   On: 04/26/2021 02:52    EKG: personally reviewed my interpretation is sinus rhythm   ASSESSMENT & PLAN:    Assessment & Plan by Problem: Active Problems:   Cellulitis   Pearly Jeanclaude Wentworth is a 75 y.o. with pertinent PMH of CHF A. fib on Eliquis COPD hypertension who presented with complaints of bilateral leg pain and swelling and admitted for wound check on hospital day 0  #Acute on chronic congestive heart failure secondary to medication nonadherence Patient has been out of his Lasix for approximately 1 week. Most recent echo showing an EF 40 to 45%. EKG showing normal sinus rhythm. Mild elevation BNP 400s.  Troponins remain flat. Patient given 40 of Lasix in the emergency department. Monitor I's and O's and weight  #Bilateral lower extremity venous stasis wounds ? Bilateral lower extremity cellulitis Multiple open wounds bilaterally draining straw-colored clear nonpurulent fluid.  Wounds are located within the region of erythema bilaterally and area of erythema is not raised suggestive of chronic venous stasis wounds.   Pedal pulses difficult to palpate, vascular insufficiency possibly relating to poor wound healing.  Can consider obtaining ABIs. If ABIs normal patient would likely benefit from compression stockings. Patient given dose of vancomycin in emergency department. - Can consider transitioning to Keflex, although patient has been afebrile white  blood cell count within normal limits lactic acid and sed rate normal - Follow-up hemoglobin A1c  COPD Patient reports chronic dyspnea on exertion for past year.  Satting well on room air does not appear to be in acute distress.  Denies any productive cough.  Bilateral wheeze noted on physical exam.  No concern for infection as patient is not producing sputum has been afebrile white count normal and chest x-ray did not show any signs of infection. - DuoNeb and Dulera  #  A. fib on Eliquis.   Rhythm controlled with amiodarone 200 mg Patient has been out of his Eliquis. Restart home medications  Hyperlipidemia. Continue atorvastatin  Chronic opioid dependence. Continue Suboxone  Diet: Heart Healthy VTE: NOAC IVF: None,None Code: Full  Prior to Admission Living Arrangement: Home, living with family Anticipated Discharge Location: Home Barriers to Discharge: Pending medical stability  Dispo: Admit patient to Observation with expected length of stay less than 2 midnights.  Signed: Delene Ruffini, MD Internal Medicine Resident PGY-1 Pager: [phone]  04/26/2021, 6:26 AM

## 2021-04-26 NOTE — Progress Notes (Signed)
VASCULAR LAB    ABIs have been performed.  See CV proc for preliminary results.   Niara Bunker, RVT 04/26/2021, 9:35 AM

## 2021-04-26 NOTE — ED Provider Notes (Signed)
[facility] EMERGENCY DEPARTMENT Provider Note   CSN: [id number] Arrival date & time: 04/25/21  1101     History Chief Complaint  Patient presents with   Wound Check    [name] is a 75 y.o. male.  Patient with a history of atrial fibrillation on Eliquis, CHF, COPD, hypertension, polycythemia, renal insufficiency here with lower extremity swelling, pain and drainage for the past several weeks.  States he is developed bilateral pain to his lower extremities with multiple oozing wounds.  Denies any fall or trauma.  Increasing over the past 1 week with clear drainage from wounds with surrounding erythema and drainage.  No fevers or chills.  Denies any difficulty breathing.  No chest pain.  States has not had his Lasix for several days.  He has also been out of his eliquis for one week. Comes in tonight with worsening pain and difficulty walking due to pain in his legs.  Does not think he had a fever.  The history is provided by the patient.  Wound Check Pertinent negatives include no abdominal pain, no headaches and no shortness of breath.      Past Medical History:  Diagnosis Date   Acute lower GI bleeding 02/15/2017   Atrial fibrillation (HCC)    Cardiomyopathy    EF55% 11/14<<35%    CHF (congestive heart failure) (HCC)    COPD (chronic obstructive pulmonary disease) ([place])    Difficult airway for intubation    per telephone encounter notation   Essential hypertension    GERD (gastroesophageal reflux disease)    Gout    Hepatitis    Possible history   History of atrial flutter    Ablation 2012   Myeloproliferative neoplasm ([place]) 08/15/2013   Obesity    Pneumonia X 1   Primary polycythemia ([place]) 06/12/2013   Renal insufficiency    Substance abuse ([place])    History of alcohol; hx cocaine, heroin, crack use   Syncope    Tubular adenoma of colon 08/2014    Patient Active Problem List   Diagnosis Date Noted   HLD (hyperlipidemia)  11/27/2020   Prediabetes 10/13/2020   Non-ST elevation (NSTEMI) myocardial infarction [facility])    Hx of adenomatous colonic polyps    Benign neoplasm of cecum    Benign neoplasm of ascending colon    Benign neoplasm of transverse colon    Benign neoplasm of descending colon    Benign neoplasm of sigmoid colon    Acute on chronic systolic CHF (congestive heart failure) ([place]) 04/04/2019   Long term current use of amiodarone 01/24/2018   GI bleed 02/15/2017   Pancytopenia, acquired ([place]) 11/26/2016   Tubular adenoma 11/25/2016   Gout 11/02/2016   Encounter for central line placement 05/25/2016   Hepatitis C antibody test positive    Hematuria 11/09/2014   Acute kidney injury ([place]) 11/01/2014   Leukopenia due to antineoplastic chemotherapy ([place]) 08/24/2014   Thrombocytopenia due to drugs 07/25/2014   Encounter for screening colonoscopy 07/02/2014   Chronic anticoagulation 07/02/2014   Acute pulmonary edema ([place]) 06/05/2014   Prolonged QT interval 06/05/2014   Sinus bradycardia 06/05/2014   Opioid dependence ([place]) 84/16/6063   Diastolic dysfunction 01/60/1093   COPD (chronic obstructive pulmonary disease) ([place]) 05/30/2014   Health care maintenance 05/24/2014   Acute respiratory failure with hypoxia and hypercapnia ([place]) 04/29/2014   Anemia due to antineoplastic chemotherapy 04/24/2014   Thyroid mass 08/26/2013   Myeloproliferative neoplasm ([place]) 08/15/2013   Polycythemia vera ([place]) 01/03/2013  Malnutrition of moderate degree ([place]) 12/15/2012   Tobacco abuse 09/15/2012   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[place])    Alcoholism ([place]) 09/30/2010   Hypertension 09/30/2010   Atrial fibrillation [facility])     Past Surgical History:  Procedure Laterality Date   CARDIAC ELECTROPHYSIOLOGY MAPPING AND ABLATION  08/2010   [name] 09/07/2010 (12/14/2012)   COLONOSCOPY     15-20 years ago had colon in [place] N/A 02/12/2020   Procedure: COLONOSCOPY WITH  PROPOFOL;  Surgeon: Ladene Artist, MD;  Location: [facility] ENDOSCOPY;  Service: Endoscopy;  Laterality: N/A;   CORONARY STENT INTERVENTION N/A 10/10/2020   Procedure: CORONARY STENT INTERVENTION;  Surgeon: Lorretta Harp, MD;  Location: [place] CV LAB;  Service: Cardiovascular;  Laterality: N/A;   EXCISIONAL HEMORRHOIDECTOMY  1970's   LOOP RECORDER IMPLANT N/A 08/23/2013   Procedure: LOOP RECORDER IMPLANT;  Surgeon: Deboraha Sprang, MD;  Location: [facility] CATH LAB;  Service: Cardiovascular;  Laterality: N/A;   MULTIPLE EXTRACTIONS WITH ALVEOLOPLASTY Bilateral 01/24/2016   Procedure: MULTIPLE EXTRACTION WITH ALVEOLOPLASTY BILATERAL;  Surgeon: Diona Browner, DDS;  Location: [place];  Service: Oral Surgery;  Laterality: Bilateral;   MULTIPLE TOOTH EXTRACTIONS  01/24/2016   MULTIPLE EXTRACTION WITH ALVEOLOPLASTY BILATERAL (Bilateral)   POLYPECTOMY  02/12/2020   Procedure: POLYPECTOMY;  Surgeon: Ladene Artist, MD;  Location: [facility] ENDOSCOPY;  Service: Endoscopy;;   RIGHT/LEFT HEART CATH AND CORONARY ANGIOGRAPHY N/A 10/09/2020   Procedure: RIGHT/LEFT HEART CATH AND CORONARY ANGIOGRAPHY;  Surgeon: Burnell Blanks, MD;  Location: [place] CV LAB;  Service: Cardiovascular;  Laterality: N/A;       Family History  Problem Relation Age of Onset   Diabetes Mother    Hypertension Mother    Cirrhosis Father    Alcohol abuse Father    Colon cancer Neg Hx    Rectal cancer Neg Hx    Stomach cancer Neg Hx    Esophageal cancer Neg Hx    Colon polyps Neg Hx     Social History   Tobacco Use   Smoking status: Some Days    Packs/day: 1.50    Years: 50.00    Pack years: 75.00    Types: Cigarettes   Smokeless tobacco: Never   Tobacco comments:    1-2 cigarettes a day for a couple years now. encouraged cessation  Vaping Use   Vaping Use: Never used  Substance Use Topics   Alcohol use: Yes    Alcohol/week: 2.0 standard drinks    Types: 2 Shots of liquor per week    Comment: Sometimes.   Drug use:  Not Currently    Types: Heroin    Comment: stopped use in 2015. currently on suboxone    Home Medications Prior to Admission medications   Medication Sig Start Date End Date Taking? Authorizing Provider  albuterol (PROAIR HFA) 108 (90 Base) MCG/ACT inhaler Inhale 1-2 puffs into the lungs every 6 (six) hours as needed for wheezing or shortness of breath. 01/11/19   [name], MD  amiodarone (PACERONE) 200 MG tablet TAKE 1 TABLET(200 MG) BY MOUTH DAILY 01/30/21   Sanjuan Dame, MD  apixaban (ELIQUIS) 5 MG TABS tablet TAKE 1 TABLET(5 MG) BY MOUTH TWICE DAILY 11/27/20   Asencion Noble, MD  atorvastatin (LIPITOR) 80 MG tablet Take 1 tablet (80 mg total) by mouth daily. 11/27/20   Asencion Noble, MD  camphor-menthol [facility]) lotion Apply topically 3 (three) times daily. Patient taking differently:  Apply 1 application topically daily as needed for itching. 02/13/19   Kayleen Memos, DO  clopidogrel (PLAVIX) 75 MG tablet Take 1 tablet (75 mg total) by mouth daily with breakfast. 11/27/20 12/27/20  Asencion Noble, MD  furosemide (LASIX) 40 MG tablet Take 1 tablet (40 mg total) by mouth daily. 10/13/20   Jeralyn Bennett, MD  SUBOXONE 8-2 MG FILM Place 2 Film under the tongue daily. 08/21/15   [provider]  SYMBICORT 160-4.5 MCG/ACT inhaler 2 puffs at bedtime. 05/07/20   [provider]    Allergies    Patient has no known allergies.  Review of Systems   Review of Systems  Constitutional:  Negative for activity change, appetite change and fever.  HENT:  Negative for congestion and rhinorrhea.   Respiratory:  Negative for cough, chest tightness and shortness of breath.   Cardiovascular:  Positive for leg swelling.  Gastrointestinal:  Negative for abdominal pain, nausea and vomiting.  Genitourinary:  Negative for dysuria.  Skin:  Positive for wound.  Neurological:  Negative for dizziness, weakness and headaches.   all other systems are negative except as noted in  the HPI and PMH.   Physical Exam Updated Vital Signs BP (!) 143/62 (BP Location: Left Arm)   Pulse 72   Temp 98.5 F (36.9 C) (Oral)   Resp 18   Ht 5\' 6"  (1.676 m)   Wt 90.7 kg   SpO2 95%   BMI 32.28 kg/m   Physical Exam Vitals and nursing note reviewed.  Constitutional:      General: He is not in acute distress.    Appearance: He is well-developed.  HENT:     Head: Normocephalic and atraumatic.     Mouth/Throat:     Pharynx: No oropharyngeal exudate.  Eyes:     Conjunctiva/sclera: Conjunctivae normal.     Pupils: Pupils are equal, round, and reactive to light.  Neck:     Comments: No meningismus. Cardiovascular:     Rate and Rhythm: Normal rate and regular rhythm.     Heart sounds: Normal heart sounds. No murmur heard. Pulmonary:     Effort: Pulmonary effort is normal. No respiratory distress.     Breath sounds: Normal breath sounds.  Abdominal:     Palpations: Abdomen is soft.     Tenderness: There is no abdominal tenderness. There is no guarding or rebound.  Musculoskeletal:        General: Swelling and tenderness present. Normal range of motion.     Cervical back: Normal range of motion and neck supple.     Right lower leg: Edema present.     Left lower leg: Edema present.     Comments: Bilateral lower extremity edema as depicted.  Multiple areas of draining wounds with clear serous drainage that are tender to palpation.  Intact DP and PT pulses  Skin:    General: Skin is warm.  Neurological:     Mental Status: He is alert and oriented to person, place, and time.     Cranial Nerves: No cranial nerve deficit.     Motor: No abnormal muscle tone.     Coordination: Coordination normal.     Comments: No ataxia on finger to nose bilaterally. No pronator drift. 5/5 strength throughout. CN 2-12 intact.Equal grip strength. Sensation intact.   Psychiatric:        Behavior: Behavior normal.       ED Results / Procedures / Treatments   Labs (all labs ordered  are  listed, but only abnormal results are displayed) Labs Reviewed  COMPREHENSIVE METABOLIC PANEL - Abnormal; Notable for the following components:      Result Value   Glucose, Bld 133 (*)    Creatinine, Ser 1.43 (*)    Calcium 8.6 (*)    Albumin 3.1 (*)    GFR, Estimated 52 (*)    All other components within normal limits  CBC WITH DIFFERENTIAL/PLATELET - Abnormal; Notable for the following components:   RDW 15.6 (*)    Neutro Abs 8.2 (*)    All other components within normal limits  BRAIN NATRIURETIC PEPTIDE - Abnormal; Notable for the following components:   B Natriuretic Peptide 490.1 (*)    All other components within normal limits  BRAIN NATRIURETIC PEPTIDE - Abnormal; Notable for the following components:   B Natriuretic Peptide 529.5 (*)    All other components within normal limits  URINALYSIS, ROUTINE W REFLEX MICROSCOPIC - Abnormal; Notable for the following components:   Color, Urine STRAW (*)    Hgb urine dipstick SMALL (*)    All other components within normal limits  C-REACTIVE PROTEIN - Abnormal; Notable for the following components:   CRP 2.6 (*)    All other components within normal limits  TROPONIN I (HIGH SENSITIVITY) - Abnormal; Notable for the following components:   Troponin I (High Sensitivity) 19 (*)    All other components within normal limits  RESP PANEL BY RT-PCR (FLU A&B, COVID) [id number]  LACTIC ACID, PLASMA  SEDIMENTATION RATE  BASIC METABOLIC PANEL  CBC  HEMOGLOBIN A1C  TROPONIN I (HIGH SENSITIVITY)    EKG EKG Interpretation  Date/Time:  Saturday April 26 2021 02:10:25 EDT Ventricular Rate:  68 PR Interval:  214 QRS Duration: 77 QT Interval:  449 QTC Calculation: 478 R Axis:   13 Text Interpretation: Sinus rhythm Borderline prolonged PR interval Consider left atrial enlargement Low voltage, precordial leads Borderline repolarization abnormality Borderline prolonged QT interval now sinus Confirmed by Ezequiel Essex [phone]) on 04/26/2021  2:27:48 AM  Radiology DG Chest Portable 1 View  Result Date: 04/26/2021 CLINICAL DATA:  Shortness of breath EXAM: PORTABLE CHEST 1 VIEW COMPARISON:  10/08/2020 FINDINGS: Cardiac enlargement. No vascular congestion, edema, or consolidation. No definite pleural effusion or pneumothorax. Mediastinal contours appear intact. Calcified and tortuous aorta. Loop recorder. Degenerative changes in the shoulders. IMPRESSION: Cardiac enlargement.  No vascular congestion or edema. Electronically Signed   By: Lucienne Capers M.D.   On: 04/26/2021 02:52    Procedures Procedures   Medications Ordered in ED Medications - No data to display  ED Course  I have reviewed the triage vital signs and the nursing notes.  Pertinent labs & imaging results that were available during my care of the patient were reviewed by me and considered in my medical decision making (see chart for details).    MDM Rules/Calculators/A&P                          Leg swelling with draining wounds.  No hypoxia or increased work of breathing  X-ray shows comes congestion.  No increased work of breathing or hypoxia.  Lactate normal, no leukocytosis.  Patient given IV Lasix as well as IV antibiotics. Creatinine at baseline  Appears to have bilateral lower extremity cellulitis with edema likely from noncompliance with Lasix.  No hypoxia or increased work of breathing.  Does have likely CHF exacerbation with questionable cellulitis.  Plan admission for antibiotics as  well as IV diuresis.  Labs appear to be at baseline.  Discussed with internal medicine residents Final Clinical Impression(s) / ED Diagnoses Final diagnoses:  None    Rx / DC Orders ED Discharge Orders     None        Ranger Petrich, Annie Main, MD 04/26/21 [phone]

## 2021-04-28 ENCOUNTER — Other Ambulatory Visit: Payer: Self-pay

## 2021-04-28 DIAGNOSIS — I4891 Unspecified atrial fibrillation: Secondary | ICD-10-CM

## 2021-04-29 MED ORDER — AMIODARONE HCL 200 MG PO TABS: 200.0000 mg | ORAL_TABLET | Freq: Every day | ORAL | 0 refills | Status: DC

## 2021-04-29 MED ORDER — FUROSEMIDE 40 MG PO TABS: 40.0000 mg | ORAL_TABLET | Freq: Every day | ORAL | 0 refills | Status: DC

## 2021-07-01 ENCOUNTER — Other Ambulatory Visit: Payer: Self-pay

## 2021-07-02 MED ORDER — SYMBICORT 160-4.5 MCG/ACT IN AERO: 2.0000 | INHALATION_SPRAY | Freq: Every day | RESPIRATORY_TRACT | 0 refills | Status: DC

## 2021-08-22 ENCOUNTER — Other Ambulatory Visit: Payer: Self-pay

## 2021-08-22 ENCOUNTER — Inpatient Hospital Stay (HOSPITAL_COMMUNITY)
Admission: EM | Admit: 2021-08-22 | Discharge: 2021-08-31 | DRG: 291 | Disposition: A | Discharge status: Home Health | Payer: Medicare Other | Attending: Internal Medicine | Admitting: Internal Medicine

## 2021-08-22 ENCOUNTER — Encounter (HOSPITAL_COMMUNITY): Payer: Self-pay | Admitting: Emergency Medicine

## 2021-08-22 ENCOUNTER — Inpatient Hospital Stay (HOSPITAL_COMMUNITY): Payer: Medicare Other

## 2021-08-22 ENCOUNTER — Emergency Department (HOSPITAL_COMMUNITY): Payer: Medicare Other

## 2021-08-22 ENCOUNTER — Other Ambulatory Visit (HOSPITAL_COMMUNITY): Payer: Self-pay

## 2021-08-22 DIAGNOSIS — Z20822 Contact with and (suspected) exposure to covid-19: Secondary | ICD-10-CM | POA: Diagnosis present

## 2021-08-22 DIAGNOSIS — I130 Hypertensive heart and chronic kidney disease with heart failure and stage 1 through stage 4 chronic kidney disease, or unspecified chronic kidney disease: Principal | ICD-10-CM | POA: Diagnosis present

## 2021-08-22 DIAGNOSIS — I4892 Unspecified atrial flutter: Secondary | ICD-10-CM | POA: Diagnosis not present

## 2021-08-22 DIAGNOSIS — I2510 Atherosclerotic heart disease of native coronary artery without angina pectoris: Secondary | ICD-10-CM | POA: Diagnosis present

## 2021-08-22 DIAGNOSIS — Z720 Tobacco use: Secondary | ICD-10-CM | POA: Diagnosis present

## 2021-08-22 DIAGNOSIS — Z955 Presence of coronary angioplasty implant and graft: Secondary | ICD-10-CM

## 2021-08-22 DIAGNOSIS — I252 Old myocardial infarction: Secondary | ICD-10-CM | POA: Diagnosis not present

## 2021-08-22 DIAGNOSIS — T462X6A Underdosing of other antidysrhythmic drugs, initial encounter: Secondary | ICD-10-CM | POA: Diagnosis present

## 2021-08-22 DIAGNOSIS — D45 Polycythemia vera: Secondary | ICD-10-CM | POA: Diagnosis present

## 2021-08-22 DIAGNOSIS — G4733 Obstructive sleep apnea (adult) (pediatric): Secondary | ICD-10-CM | POA: Diagnosis not present

## 2021-08-22 DIAGNOSIS — I25708 Atherosclerosis of coronary artery bypass graft(s), unspecified, with other forms of angina pectoris: Secondary | ICD-10-CM | POA: Diagnosis not present

## 2021-08-22 DIAGNOSIS — F1110 Opioid abuse, uncomplicated: Secondary | ICD-10-CM | POA: Diagnosis present

## 2021-08-22 DIAGNOSIS — I5023 Acute on chronic systolic (congestive) heart failure: Secondary | ICD-10-CM | POA: Diagnosis not present

## 2021-08-22 DIAGNOSIS — F1920 Other psychoactive substance dependence, uncomplicated: Secondary | ICD-10-CM | POA: Diagnosis not present

## 2021-08-22 DIAGNOSIS — N289 Disorder of kidney and ureter, unspecified: Secondary | ICD-10-CM

## 2021-08-22 DIAGNOSIS — Y92009 Unspecified place in unspecified non-institutional (private) residence as the place of occurrence of the external cause: Secondary | ICD-10-CM | POA: Diagnosis not present

## 2021-08-22 DIAGNOSIS — Z6837 Body mass index (BMI) 37.0-37.9, adult: Secondary | ICD-10-CM

## 2021-08-22 DIAGNOSIS — R7303 Prediabetes: Secondary | ICD-10-CM | POA: Diagnosis not present

## 2021-08-22 DIAGNOSIS — Z7952 Long term (current) use of systemic steroids: Secondary | ICD-10-CM

## 2021-08-22 DIAGNOSIS — Z91119 Patient's noncompliance with dietary regimen due to unspecified reason: Secondary | ICD-10-CM

## 2021-08-22 DIAGNOSIS — I420 Dilated cardiomyopathy: Secondary | ICD-10-CM | POA: Diagnosis present

## 2021-08-22 DIAGNOSIS — I739 Peripheral vascular disease, unspecified: Secondary | ICD-10-CM | POA: Diagnosis not present

## 2021-08-22 DIAGNOSIS — I4819 Other persistent atrial fibrillation: Secondary | ICD-10-CM | POA: Diagnosis present

## 2021-08-22 DIAGNOSIS — I484 Atypical atrial flutter: Secondary | ICD-10-CM | POA: Diagnosis not present

## 2021-08-22 DIAGNOSIS — J9601 Acute respiratory failure with hypoxia: Secondary | ICD-10-CM | POA: Diagnosis not present

## 2021-08-22 DIAGNOSIS — J449 Chronic obstructive pulmonary disease, unspecified: Secondary | ICD-10-CM | POA: Diagnosis not present

## 2021-08-22 DIAGNOSIS — E669 Obesity, unspecified: Secondary | ICD-10-CM | POA: Diagnosis not present

## 2021-08-22 DIAGNOSIS — F1190 Opioid use, unspecified, uncomplicated: Secondary | ICD-10-CM

## 2021-08-22 DIAGNOSIS — I872 Venous insufficiency (chronic) (peripheral): Secondary | ICD-10-CM | POA: Diagnosis present

## 2021-08-22 DIAGNOSIS — Z9114 Patient's other noncompliance with medication regimen: Secondary | ICD-10-CM | POA: Diagnosis not present

## 2021-08-22 DIAGNOSIS — Z8249 Family history of ischemic heart disease and other diseases of the circulatory system: Secondary | ICD-10-CM

## 2021-08-22 DIAGNOSIS — Z7902 Long term (current) use of antithrombotics/antiplatelets: Secondary | ICD-10-CM

## 2021-08-22 DIAGNOSIS — E876 Hypokalemia: Secondary | ICD-10-CM | POA: Diagnosis not present

## 2021-08-22 DIAGNOSIS — I255 Ischemic cardiomyopathy: Secondary | ICD-10-CM | POA: Diagnosis present

## 2021-08-22 DIAGNOSIS — Z7901 Long term (current) use of anticoagulants: Secondary | ICD-10-CM

## 2021-08-22 DIAGNOSIS — I083 Combined rheumatic disorders of mitral, aortic and tricuspid valves: Secondary | ICD-10-CM | POA: Diagnosis not present

## 2021-08-22 DIAGNOSIS — F17210 Nicotine dependence, cigarettes, uncomplicated: Secondary | ICD-10-CM | POA: Diagnosis present

## 2021-08-22 DIAGNOSIS — Z833 Family history of diabetes mellitus: Secondary | ICD-10-CM

## 2021-08-22 DIAGNOSIS — I5043 Acute on chronic combined systolic (congestive) and diastolic (congestive) heart failure: Secondary | ICD-10-CM | POA: Diagnosis present

## 2021-08-22 DIAGNOSIS — F1910 Other psychoactive substance abuse, uncomplicated: Secondary | ICD-10-CM | POA: Diagnosis not present

## 2021-08-22 DIAGNOSIS — N183 Chronic kidney disease, stage 3 unspecified: Secondary | ICD-10-CM | POA: Diagnosis not present

## 2021-08-22 DIAGNOSIS — M25562 Pain in left knee: Secondary | ICD-10-CM | POA: Diagnosis not present

## 2021-08-22 DIAGNOSIS — N179 Acute kidney failure, unspecified: Secondary | ICD-10-CM | POA: Diagnosis not present

## 2021-08-22 DIAGNOSIS — Z79899 Other long term (current) drug therapy: Secondary | ICD-10-CM

## 2021-08-22 DIAGNOSIS — I509 Heart failure, unspecified: Secondary | ICD-10-CM

## 2021-08-22 DIAGNOSIS — R778 Other specified abnormalities of plasma proteins: Secondary | ICD-10-CM

## 2021-08-22 DIAGNOSIS — I428 Other cardiomyopathies: Secondary | ICD-10-CM | POA: Diagnosis not present

## 2021-08-22 DIAGNOSIS — R0600 Dyspnea, unspecified: Secondary | ICD-10-CM

## 2021-08-22 DIAGNOSIS — T45516A Underdosing of anticoagulants, initial encounter: Secondary | ICD-10-CM | POA: Diagnosis present

## 2021-08-22 DIAGNOSIS — Z811 Family history of alcohol abuse and dependence: Secondary | ICD-10-CM

## 2021-08-22 DIAGNOSIS — Z91128 Patient's intentional underdosing of medication regimen for other reason: Secondary | ICD-10-CM

## 2021-08-22 DIAGNOSIS — E785 Hyperlipidemia, unspecified: Secondary | ICD-10-CM | POA: Diagnosis present

## 2021-08-22 DIAGNOSIS — N1831 Chronic kidney disease, stage 3a: Secondary | ICD-10-CM | POA: Diagnosis not present

## 2021-08-22 DIAGNOSIS — I5021 Acute systolic (congestive) heart failure: Secondary | ICD-10-CM | POA: Diagnosis not present

## 2021-08-22 DIAGNOSIS — I480 Paroxysmal atrial fibrillation: Secondary | ICD-10-CM | POA: Diagnosis not present

## 2021-08-22 DIAGNOSIS — Z7982 Long term (current) use of aspirin: Secondary | ICD-10-CM

## 2021-08-22 DIAGNOSIS — I4891 Unspecified atrial fibrillation: Secondary | ICD-10-CM | POA: Diagnosis present

## 2021-08-22 LAB — BASIC METABOLIC PANEL
Anion gap: 13 (ref 5–15)
BUN: 28 mg/dL — ABNORMAL HIGH (ref 8–23)
CO2: 23 mmol/L (ref 22–32)
Calcium: 8.5 mg/dL — ABNORMAL LOW (ref 8.9–10.3)
Chloride: 106 mmol/L (ref 98–111)
Creatinine, Ser: 1.54 mg/dL — ABNORMAL HIGH (ref 0.61–1.24)
GFR, Estimated: 47 mL/min — ABNORMAL LOW (ref >60)
Glucose, Bld: 119 mg/dL — ABNORMAL HIGH (ref 70–99)
Potassium: 3.4 mmol/L — ABNORMAL LOW (ref 3.5–5.1)
Sodium: 142 mmol/L (ref 135–145)

## 2021-08-22 LAB — CBC
HCT: 47.3 % (ref 39.0–52.0)
Hemoglobin: 15.3 g/dL (ref 13.0–17.0)
MCH: 31.5 pg (ref 26.0–34.0)
MCHC: 32.3 g/dL (ref 30.0–36.0)
MCV: 97.3 fL (ref 80.0–100.0)
Platelets: 251 10*3/uL (ref 150–400)
RBC: 4.86 MIL/uL (ref 4.22–5.81)
RDW: 15.3 % (ref 11.5–15.5)
WBC: 11.7 10*3/uL — ABNORMAL HIGH (ref 4.0–10.5)
nRBC: 0 % (ref 0.0–0.2)

## 2021-08-22 LAB — TROPONIN I (HIGH SENSITIVITY)
Troponin I (High Sensitivity): 30 ng/L — ABNORMAL HIGH (ref <18)
Troponin I (High Sensitivity): 31 ng/L — ABNORMAL HIGH (ref <18)

## 2021-08-22 LAB — HEPATIC FUNCTION PANEL
ALT: 23 U/L (ref 0–44)
AST: 29 U/L (ref 15–41)
Albumin: 3.1 g/dL — ABNORMAL LOW (ref 3.5–5.0)
Alkaline Phosphatase: 152 U/L — ABNORMAL HIGH (ref 38–126)
Bilirubin, Direct: 0.2 mg/dL (ref 0.0–0.2)
Indirect Bilirubin: 0.5 mg/dL (ref 0.3–0.9)
Total Bilirubin: 0.7 mg/dL (ref 0.3–1.2)
Total Protein: 7.2 g/dL (ref 6.5–8.1)

## 2021-08-22 LAB — RAPID URINE DRUG SCREEN, HOSP PERFORMED
Amphetamines: NOT DETECTED
Barbiturates: NOT DETECTED
Benzodiazepines: NOT DETECTED
Cocaine: NOT DETECTED
Opiates: NOT DETECTED
Tetrahydrocannabinol: NOT DETECTED

## 2021-08-22 LAB — BRAIN NATRIURETIC PEPTIDE: B Natriuretic Peptide: 953.7 pg/mL — ABNORMAL HIGH (ref 0.0–100.0)

## 2021-08-22 LAB — RESP PANEL BY RT-PCR (FLU A&B, COVID) ARPGX2
Influenza A by PCR: NEGATIVE
Influenza B by PCR: NEGATIVE
SARS Coronavirus 2 by RT PCR: NEGATIVE

## 2021-08-22 LAB — TSH: TSH: 1.462 u[IU]/mL (ref 0.350–4.500)

## 2021-08-22 LAB — HEMOGLOBIN A1C
Hgb A1c MFr Bld: 6 % — ABNORMAL HIGH (ref 4.8–5.6)
Mean Plasma Glucose: 125.5 mg/dL

## 2021-08-22 MED ORDER — AMIODARONE HCL IN DEXTROSE 360-4.14 MG/200ML-% IV SOLN
30.0000 mg/h | INTRAVENOUS | Status: DC
  Administered 2021-08-23 – 2021-08-24 (×3): 30 mg/h via INTRAVENOUS
  Filled 2021-08-22 (×3): qty 200

## 2021-08-22 MED ORDER — APIXABAN 5 MG PO TABS
5.0000 mg | ORAL_TABLET | Freq: Two times a day (BID) | ORAL | Status: DC
  Administered 2021-08-22 – 2021-08-31 (×19): 5 mg via ORAL
  Filled 2021-08-22 (×19): qty 1

## 2021-08-22 MED ORDER — FUROSEMIDE 10 MG/ML IJ SOLN
40.0000 mg | Freq: Once | INTRAMUSCULAR | Status: AC
  Administered 2021-08-22: 40 mg via INTRAVENOUS
  Filled 2021-08-22: qty 4

## 2021-08-22 MED ORDER — ACETAMINOPHEN 650 MG RE SUPP: 650.0000 mg | Freq: Four times a day (QID) | RECTAL | Status: DC | PRN

## 2021-08-22 MED ORDER — CLOPIDOGREL BISULFATE 75 MG PO TABS
75.0000 mg | ORAL_TABLET | Freq: Every day | ORAL | Status: DC
  Administered 2021-08-22 – 2021-08-31 (×10): 75 mg via ORAL
  Filled 2021-08-22 (×10): qty 1

## 2021-08-22 MED ORDER — FUROSEMIDE 10 MG/ML IJ SOLN
80.0000 mg | Freq: Two times a day (BID) | INTRAMUSCULAR | Status: DC
  Administered 2021-08-22: 80 mg via INTRAVENOUS
  Filled 2021-08-22: qty 8

## 2021-08-22 MED ORDER — ENOXAPARIN SODIUM 40 MG/0.4ML IJ SOSY: 40.0000 mg | PREFILLED_SYRINGE | INTRAMUSCULAR | Status: DC

## 2021-08-22 MED ORDER — POTASSIUM CHLORIDE CRYS ER 20 MEQ PO TBCR: 40.0000 meq | EXTENDED_RELEASE_TABLET | Freq: Once | ORAL | Status: DC

## 2021-08-22 MED ORDER — BUPRENORPHINE HCL-NALOXONE HCL 8-2 MG SL SUBL
1.0000 | SUBLINGUAL_TABLET | Freq: Two times a day (BID) | SUBLINGUAL | Status: DC
  Administered 2021-08-22 – 2021-08-31 (×19): 1 via SUBLINGUAL
  Filled 2021-08-22 (×19): qty 1

## 2021-08-22 MED ORDER — IPRATROPIUM-ALBUTEROL 0.5-2.5 (3) MG/3ML IN SOLN: 3.0000 mL | Freq: Once | RESPIRATORY_TRACT | Status: DC

## 2021-08-22 MED ORDER — SENNOSIDES-DOCUSATE SODIUM 8.6-50 MG PO TABS: 1.0000 | ORAL_TABLET | Freq: Every evening | ORAL | Status: DC | PRN

## 2021-08-22 MED ORDER — FLUTICASONE FUROATE-VILANTEROL 200-25 MCG/ACT IN AEPB
1.0000 | INHALATION_SPRAY | Freq: Every day | RESPIRATORY_TRACT | Status: DC
  Administered 2021-08-24 – 2021-08-31 (×8): 1 via RESPIRATORY_TRACT
  Filled 2021-08-22: qty 28

## 2021-08-22 MED ORDER — MOMETASONE FURO-FORMOTEROL FUM 100-5 MCG/ACT IN AERO
2.0000 | INHALATION_SPRAY | Freq: Two times a day (BID) | RESPIRATORY_TRACT | Status: DC
  Filled 2021-08-22: qty 8.8

## 2021-08-22 MED ORDER — AMIODARONE HCL IN DEXTROSE 360-4.14 MG/200ML-% IV SOLN
60.0000 mg/h | INTRAVENOUS | Status: AC
  Administered 2021-08-22 (×2): 60 mg/h via INTRAVENOUS
  Filled 2021-08-22 (×2): qty 200

## 2021-08-22 MED ORDER — METOPROLOL TARTRATE 5 MG/5ML IV SOLN
2.5000 mg | Freq: Once | INTRAVENOUS | Status: AC
Start: 2021-08-22 — End: 2021-08-22
  Administered 2021-08-22: 2.5 mg via INTRAVENOUS
  Filled 2021-08-22: qty 5

## 2021-08-22 MED ORDER — ATORVASTATIN CALCIUM 80 MG PO TABS
80.0000 mg | ORAL_TABLET | Freq: Every day | ORAL | Status: DC
  Administered 2021-08-23 – 2021-08-31 (×9): 80 mg via ORAL
  Filled 2021-08-22 (×9): qty 1

## 2021-08-22 MED ORDER — FUROSEMIDE 10 MG/ML IJ SOLN
40.0000 mg | Freq: Two times a day (BID) | INTRAMUSCULAR | Status: DC
  Administered 2021-08-22: 40 mg via INTRAVENOUS
  Filled 2021-08-22: qty 4

## 2021-08-22 MED ORDER — POTASSIUM CHLORIDE CRYS ER 20 MEQ PO TBCR
40.0000 meq | EXTENDED_RELEASE_TABLET | Freq: Once | ORAL | Status: AC
  Administered 2021-08-22: 40 meq via ORAL
  Filled 2021-08-22: qty 2

## 2021-08-22 MED ORDER — AMIODARONE LOAD VIA INFUSION
150.0000 mg | Freq: Once | INTRAVENOUS | Status: AC
  Administered 2021-08-22: 150 mg via INTRAVENOUS
  Filled 2021-08-22: qty 83.34

## 2021-08-22 MED ORDER — ALBUTEROL SULFATE (2.5 MG/3ML) 0.083% IN NEBU: 2.5000 mg | INHALATION_SOLUTION | Freq: Four times a day (QID) | RESPIRATORY_TRACT | Status: DC | PRN

## 2021-08-22 MED ORDER — ATORVASTATIN CALCIUM 40 MG PO TABS
40.0000 mg | ORAL_TABLET | Freq: Every day | ORAL | Status: DC
  Administered 2021-08-22: 40 mg via ORAL
  Filled 2021-08-22: qty 1

## 2021-08-22 MED ORDER — ASPIRIN EC 81 MG PO TBEC: 81.0000 mg | DELAYED_RELEASE_TABLET | Freq: Every day | ORAL | Status: DC

## 2021-08-22 MED ORDER — ACETAMINOPHEN 325 MG PO TABS
650.0000 mg | ORAL_TABLET | Freq: Four times a day (QID) | ORAL | Status: DC | PRN
  Administered 2021-08-22 – 2021-08-30 (×8): 650 mg via ORAL
  Filled 2021-08-22 (×8): qty 2

## 2021-08-22 NOTE — Progress Notes (Addendum)
Subjective: Paged ON for increasing SHOB, reporting it is uncomfortable for him to lie down.  Feels more comfortable seated upright in the chair leaning forward slightly. Reports is having urine output though not very much. Normal work of breathing on 5L [place] in tripod position.  On auscultation of lungs, minimal bilateral crackles R >L. Chest x-ray with worsening pulmonary edema. Pt was placed on BIPAP and given IV 120mg  of Lasix.   Patient was seen at bedside during rounds today. Pt reports feeling well. He has noticed improvement in [facility]. He denies chest pain and abdominal pain. No questions or concerns. Pt is updated on the plan for today.  Objective:  Vital signs in last 24 hours: Vitals:   08/23/21 0417 08/23/21 0723 08/23/21 0806 08/23/21 1129  BP: 109/79  113/78 113/77  Pulse: (!) 108  (!) 123 (!) 110  Resp: 20  20 20   Temp:   98.9 F (37.2 C) 97.9 F (36.6 C)  TempSrc:   Oral Oral  SpO2: 100% 98% 97% 99%  Weight: 99.7 kg     Height:       Constitutional: alert, well-appearing, in NAD HENT: normocephalic, atraumatic, mucous membranes moist Cardiovascular: RRR, no m/r/g, 2+ bilateral LE Pulmonary/Chest: labored breathing on 4-5L O2, crackles in lung bases  Abdominal: soft, non-tender to palpation, non-distended MSK: normal bulk and tone Neurological: A&O x 3 Skin: warm and dry  Assessment/Plan:  Principal Problem:   Acute on chronic heart failure (HCC) Active Problems:   Atrial fibrillation (HCC)   Tobacco abuse   Polycythemia vera (HCC)   COPD (chronic obstructive pulmonary disease) (HCC)   Opioid use disorder  [name] is a 76 y.o. male with multiple pertinent PMH admitted for acute on chronic HRrEF exacerbation and Afib with RVR.    #Acute HFrEF exacerbation   #Hx of combined HF #Hx of medication nonadherence Remains fluid overload on exam with labored breathing on 4-5L supplemental O2. Was on bipap overnight d/t resp status. Not diuresing  well s/p 120 mg IV Lasix last night; put out 160cc, though may be inaccurate as he's urinated in the toilet some. Repeat echo pending, may need cath if EF reduced.  -Cardiology following, appreciate assistance   -Increased to IV Lasix 120 bid  -Can add 2.5mg  metolazone if not reaching goal of net negative 2L -Place on bipap if worsening respiratory status  -GDMT limited by BP -Strict ins and outs and daily weights -Trend BMP   #Paroxysmal Atrial Fibrillation/Flutter #Hx of A flutter in 2012 s/p ablation in 2012 #Medication nonadherence Improved rates in 110-120's.  -Cardiology following, appreciate assistance  -Continue IV amiodarone   -Continue Eliquis 5 mg -Possible TEE/DCCV this admission    #CKD stage III a #Hypokalemia Bump in Cr from 1.54 to 2.1 in s/o IV Lasix use    -Trend BMP -Replete electrolytes as needed   #Coronary Artery Disease s/p LAD DES in March 2022 #PAD -Continue Plavix 75 mg daily -Restarted Eliquis 5 mg daily -Continue Lipitor 40 mg daily -Follow up with cardiology as an outpatient    #Polysubstance abuse -Continue Suboxone 8-2 mg bid  -Monitor for signs of withdrawal   #COPD -Dulera daily -Albuterol as needed   #OSA -nightly CPAP   #Prediabetes A1c 6.0% this admission   Best Practice: Diet: Heart Healthy IVF: None,None VTE: Eliquis  Code: Full   [name], MD  Internal Medicine Resident, PGY-1 Pager: [phone] After 5pm on weekdays and 1pm on weekends: On Call pager [phone]

## 2021-08-22 NOTE — Plan of Care (Signed)
  Problem: Education: Goal: Knowledge of General Education information will improve Description Including pain rating scale, medication(s)/side effects and non-pharmacologic comfort measures Outcome: Progressing   

## 2021-08-22 NOTE — Plan of Care (Addendum)
Paged concerning shortness of breath.  RN reports worsening shortness of breath over the last few hours.  RT requesting transition from nasal cannula CPAP to BiPAP given patient is a mouth breather.  Patient seen at bedside.  He reports shortness of breath that is worsened in the last hour.  Reports uncomfortable for him to lie down.  Feels more comfortable seated upright in the chair leaning forward slightly.  Has not had any inhalers today.  Reports is having urine output though not very much.  On exam, normal work of breathing on 5L [place] in tripod position.  On auscultation of lungs, minimal bilateral crackles R >L.  Transmitted upper airway sounds.  No wheezing.  On assessment, patient has had worsening of shortness of breath within the last hour, still a little uncomfortable on 5 L nasal cannula though satting well which is reassuring.  Did receive 80mg  of IV Lasix this afternoon.  Patient reports lower than expected urine output.  Some concern for urinary retention versus AKI secondary to diuresis causing low urine output.  Given tachycardia to 120s, will hold off on DuoNeb at this time.  We will repeat chest x-ray with plan for additional dose of diuretic if shows worsening pulmonary congestion. Plan: -CXR -Transition patient to BiPAP -Bladder scan -Follow-up AM BMP -Continue to monitor respiratory status  ADDENDUM: Chest x-ray with worsening pulmonary edema.  We will give IV Lasix 120 mg once.

## 2021-08-22 NOTE — ED Notes (Signed)
NT provided pt with BSC.

## 2021-08-22 NOTE — Hospital Course (Addendum)
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  BNP elevated from prior to 953, has gained weight since last discharge (~9 kg), CXR with cardiomegaly and possible pulmonary congestion.  Patient is a poor historian.  Endorses adherence to furosemide 40 mg daily.  Reports nonadherence to dietary recommendations for heart failure. Suspect multifactorial from medication noncompliance and dietary indiscretion. S/p IV Lasix 40 in the ED; remains fluid overload on exam with labored breathing on 3L supplemental O2. Cardiology consulted. Started on IV lasix 40 bid, and then increased to IV lasix 120 bid due to poor diuresis and worsening respiratory status. 2D echo without reduced EF (35-40%), mod-severe MR, severe TR, and mild-mod aortic dilatation. No indication for cath at this time. Diuresed 5.8L with IV Lasix. Pt was walked prior to discharge; Patient with ambulatory O2 sats of 98%. Does not meet O2 requirement. GDMT limited by BP, started metop succ 25 mg this admission. Discussed with cardiology, discharged on PO Lasix 40 bid with close outpatient cardiology follow up for medication adjustments and lab work.    #Atrial fibrillation with rapid RVR #History of a flutter in 2012 status post ablation #Medication nonadherence Patient reports not taking Eliquis or amiodarone.  Has history of medication nonadherence.  EKG shows atrial fibrillation with rapid RVR with HR of 142.  Heart rate remains elevated in the 120s with irregular rhythm on exam.  Patient denies chest pain, initial troponin 30, repeat 31, flat.  Patient received dose of metoprolol IV 2.5 mg in the ED. Remained in Afib RVR with rates in 110-140's. Consulted cardiology for help in management, given AoC HF exacerbation limiting BB use. Restarted  Eliquis 5 mg and started on IV amio, with plans for possible conversion this admission. Transitioned to PO amio 400 bid to reduce fluid burden. Improvement in rates 80's. Metop added close to end of admission (soft pressures limited adding sooner). Plan for close OP cards follow up, including in 1-2 weeks for starting maintenance dose amio. Plan for potential cardioversion as an outpatient. Discharged on Amio 400 mg bid with plans for close cardiology follow up.    #CKD stage III a #Hypokalemia On arrival BMP with creatinine 1.54 which appears stable from 3 months ago 1.5. Cr bump from 1.7 -> 1.9 -> 2.2 in s/o ongoing IV diuresis (baseline 1.54). Discontinued further diuresis, with improvement, but did bump back to 2 and last day due to 40 IV Lasix. Pt is discharged on PO Lasix 40 BID with instructions for close cardiology and PCP follow up for medication adjustments and lab work. Will need BMET in about 1 week.  -Daily BMPs -Electrolyte repletion as needed   #Chronic venous stasis dermatitis, bilateral #PAD On exam, bilateral lower extremity erythema, 2+ pitting edema bilaterally, small ulcerations and previous healed lesions.  Patient endorses pain and swelling of bilateral lower extremities.  ABIs obtained during last admission October 2022 and bilateral lower extremity arteries were noncompressible.  Patient had been referred to outpatient vascular surgery but has no appointment scheduled.  Considered cellulitis given low blood pressures and erythema, however patient has been afebrile and erythema and pain affect bilateral lower extremities with known chronic venous stasis dermatitis. -Diuresis as above -Not a candidate for compression wraps given PAD -Tylenol as needed for pain   #Polysubstance abuse Patient has history of alcoholism, tobacco use, history of  opioid dependence.  Currently smokes 1 to 2 cigarettes daily, 2-3 rum mixed drinks every other day, on Suboxone which has been getting  refilled by Jerrol Banana, Md who is a psychiatrist in [place].  No concern for withdrawal at this time.  Need to verify time of last drink. -Continued Suboxone 8-2 mg sublingual twice daily -TOC consult for addiction resources -Monitored for signs of withdrawal   #Coronary Artery Disease s/p LAD DES Patient had drug-eluting stent placed in LAD in March 2022 and started on Plavix and aspirin for 30-day course followed by Plavix monotherapy.  Patient currently taking Plavix only, no Eliquis.  Was supposed to have follow-up with cardiology following discharge in October 2022 but no appointment made.  Denies chest pain, troponins flat, EKG without ischemic changes. -Continued Plavix 75 mg daily -Restarted Eliquis 5 mg daily -Continued Lipitor 40 mg daily   #COPD PFTs completed 06/2019 FEV1 61%.  Patient reports has albuterol and other inhalers at home.  He is only using these as needed.  Continues to smoke.  No wheezing on exam. -Dulera daily -Albuterol as needed   #OSA On chart review patient had home sleep test December 2020 which was consistent with severe obstructive sleep apnea.  Unclear if patient is on CPAP. Plan: -nightly CPAP   #Prediabetes A1c 6.0% this admission

## 2021-08-22 NOTE — ED Notes (Signed)
Pt is sitting at side of bed watching TV

## 2021-08-22 NOTE — ED Notes (Signed)
Breakfast tray ordered and beverage provided

## 2021-08-22 NOTE — ED Provider Triage Note (Signed)
Emergency Medicine Provider Triage Evaluation Note  [name] , a 76 y.o. male  was evaluated in triage.  Pt complains of sob that is chronic but worse over the last 24 hours. Denies chest pain or cough. Reports leg swelling for about 1 month.  Review of Systems  Positive: Sob, leg swelling Negative: Chest pain  Physical Exam  BP (!) 140/98 (BP Location: Left Arm)    Pulse (!) 129    Temp 97.6 F (36.4 C) (Oral)    Resp (!) 29    Ht 5\' 6"  (1.676 m)    Wt 105 kg    SpO2 100%    BMI 37.36 kg/m  Gen:   Awake, no distress   Resp:  Normal effort  MSK:   Moves extremities without difficulty  Other:  Bibasilar rales, worse on the left, 1+ ble edema, tachycardic, irregular rhythm  Medical Decision Making  Medically screening exam initiated at 2:30 AM.  Appropriate orders placed.  [name] was informed that the remainder of the evaluation will be completed by another provider, this initial triage assessment does not replace that evaluation, and the importance of remaining in the ED until their evaluation is complete.     Rodney Booze, [place] 08/22/21 [phone]

## 2021-08-22 NOTE — Progress Notes (Signed)
Called Dr Posey Pronto to make him aware that the pt heart  rate is 135-145 and the pt is just sitting on the bed eating.  Dr Posey Pronto says that cardiolog will be seeing the pt because he is Afib/RVR and they are aware.[name]  I

## 2021-08-22 NOTE — ED Triage Notes (Addendum)
Patient reports SOB this evening , denies chest pain , no fever or cough , patient added right arm pain . History of CAD/CHF/COPD/Coronary Stents.

## 2021-08-22 NOTE — ED Notes (Addendum)
Upon entering the room pt was trying to catch his breath and banging arms on side rails. Pt HR 150's RN raised HOB 90 degree and had pt sit on side of bed. Pt went into a tripod position and RN coached pt to take deep breaths through his nose. Pt was able to control his breathing and HR started to come down to 115. Notified admitting team of changes.

## 2021-08-22 NOTE — ED Notes (Signed)
Attendings at the bedside

## 2021-08-22 NOTE — ED Provider Notes (Signed)
[facility] EMERGENCY DEPARTMENT Provider Note   CSN: [id number] Arrival date & time: 08/22/21  0219     History  Chief Complaint  Patient presents with   Shortness of Breath    [name] is a 76 y.o. male.  The history is provided by the patient.  Shortness of Breath He has history of hypertension, persistent atrial fibrillation anticoagulated on apixaban, combined systolic and diastolic heart failure, COPD, polycythemia vera and comes in because of difficulty breathing which started in the last 24 hours.  He is a very poor historian, but feels that he has more trouble breathing when he lays flat.  He denies chest pain, heaviness, tightness, pressure.  He denies cough.  There has been no fever or chills.  He has peripheral edema, but it is very difficult to tell from him whether this is new or not.   Home Medications Prior to Admission medications   Medication Sig Start Date End Date Taking? Authorizing Provider  acetaminophen (TYLENOL) 500 MG tablet Take 1,000 mg by mouth every 6 (six) hours as needed for mild pain.    [provider]  albuterol (PROAIR HFA) 108 (90 Base) MCG/ACT inhaler Inhale 1-2 puffs into the lungs every 6 (six) hours as needed for wheezing or shortness of breath. 01/11/19   [name], MD  amiodarone (PACERONE) 200 MG tablet Take 1 tablet (200 mg total) by mouth daily. 04/29/21   Marianna Payment, MD  apixaban (ELIQUIS) 5 MG TABS tablet Take 1 tablet (5 mg total) by mouth 2 (two) times daily. 04/26/21   Jose Persia, MD  aspirin 81 MG chewable tablet Chew 1 tablet (81 mg total) by mouth daily. 04/27/21   Jose Persia, MD  atorvastatin (LIPITOR) 80 MG tablet Take 1 tablet (80 mg total) by mouth daily. 04/26/21   Jose Persia, MD  camphor-menthol [facility]) lotion Apply topically 3 (three) times daily. Patient taking differently: Apply 1 application topically daily as needed for itching. 02/13/19   Kayleen Memos, DO   clopidogrel (PLAVIX) 75 MG tablet Take 1 tablet (75 mg total) by mouth daily with breakfast. 04/26/21 05/26/21  Jose Persia, MD  furosemide (LASIX) 40 MG tablet Take 1 tablet (40 mg total) by mouth daily. 04/29/21   Marianna Payment, MD  SUBOXONE 8-2 MG FILM Place 2 Film under the tongue daily. 08/21/15   [provider]  SYMBICORT 160-4.5 MCG/ACT inhaler Inhale 2 puffs into the lungs at bedtime. 07/02/21   Marianna Payment, MD      Allergies    Patient has no known allergies.    Review of Systems   Review of Systems  Respiratory:  Positive for shortness of breath.   All other systems reviewed and are negative.  Physical Exam Updated Vital Signs BP 108/61    Pulse 99    Temp 97.6 F (36.4 C) (Oral)    Resp 10    Ht 5\' 6"  (1.676 m)    Wt 105 kg    SpO2 95%    BMI 37.36 kg/m  Physical Exam Vitals and nursing note reviewed.  76 year old male, resting comfortably and in no acute distress. Vital signs are normal. Oxygen saturation is 95%, which is normal. Head is normocephalic and atraumatic. PERRLA, EOMI. Oropharynx is clear. Neck is nontender and supple without adenopathy or JVD. Back is nontender and there is no CVA tenderness. Lungs are clear without rales, wheezes, or rhonchi. Chest is nontender. Heart is tachycardic and irregular without  murmur. Abdomen is soft, flat, nontender without masses or hepatosplenomegaly and peristalsis is normoactive. Extremities have 1+ edema.  Moderate venous stasis changes are present bilaterally. Skin is warm and dry without rash. Neurologic: Mental status is normal, cranial nerves are intact, moves all extremities equally.  ED Results / Procedures / Treatments   Labs (all labs ordered are listed, but only abnormal results are displayed) Labs Reviewed  BASIC METABOLIC PANEL - Abnormal; Notable for the following components:      Result Value   Potassium 3.4 (*)    Glucose, Bld 119 (*)    BUN 28 (*)    Creatinine, Ser 1.54 (*)    Calcium  8.5 (*)    GFR, Estimated 47 (*)    All other components within normal limits  CBC - Abnormal; Notable for the following components:   WBC 11.7 (*)    All other components within normal limits  BRAIN NATRIURETIC PEPTIDE - Abnormal; Notable for the following components:   B Natriuretic Peptide 953.7 (*)    All other components within normal limits  TROPONIN I (HIGH SENSITIVITY) - Abnormal; Notable for the following components:   Troponin I (High Sensitivity) 30 (*)    All other components within normal limits  RESP PANEL BY RT-PCR (FLU A&B, COVID) [id number]  TROPONIN I (HIGH SENSITIVITY)    EKG EKG Interpretation  Date/Time:  Friday August 22 2021 02:26:36 EST Ventricular Rate:  142 PR Interval:    QRS Duration: 82 QT Interval:  322 QTC Calculation: 495 R Axis:   -19 Text Interpretation: Atrial fibrillation with rapid ventricular response Nonspecific T wave abnormality Abnormal ECG When compared with ECG of 26-Apr-2021 02:10, Atrial fibrillation with rapid ventricular response has replaced Sinus rhythm Confirmed by Delora Fuel ([id number]) on 08/22/2021 4:49:57 AM  Radiology DG Chest Port 1 View  Result Date: 08/22/2021 CLINICAL DATA:  Shortness of breath EXAM: PORTABLE CHEST 1 VIEW COMPARISON:  04/26/2021 FINDINGS: Cardiac shadow is enlarged but stable. Aortic calcifications are noted. Loop recorder is again seen. Lungs are clear. No acute bony abnormality is noted. IMPRESSION: Stable cardiomegaly. No other focal abnormality is noted. Electronically Signed   By: Inez Catalina M.D.   On: 08/22/2021 03:28    Procedures Procedures  Cardiac monitor shows atrial fibrillation with with rapid ventricular response per my interpretation.  Medications Ordered in ED Medications  furosemide (LASIX) injection 40 mg (has no administration in time range)  metoprolol tartrate (LOPRESSOR) injection 2.5 mg (has no administration in time range)  potassium chloride SA (KLOR-CON M) CR tablet 40 mEq (has  no administration in time range)    ED Course/ Medical Decision Making/ A&P                           Medical Decision Making Risk Prescription drug management. Decision regarding hospitalization.   Dyspnea in the setting of peripheral edema and known history of heart failure.  This seems to be exacerbation of known systolic heart failure.  Doubt pulmonary embolism with the patient chronically anticoagulated.  Consider occult ACS.  ECG shows atrial fibrillation with rapid ventricular response, nonspecific T wave changes not significantly different from prior.  Initial troponin is mildly elevated.  Chest x-ray shows cardiomegaly, no definite pulmonary vascular congestion.  Delta troponin is pending.  Renal insufficiency is present, not significantly changed from baseline.  Mild hypokalemia is present.  He is given a dose of furosemide and oral potassium.  He is also  given a dose of metoprolol to try to slow his heart rate down.  He will need to be admitted for rate control and diuresis.  Old records are reviewed showing history of difficult to control atrial fibrillation, coronary artery disease status post stent placement.  Case is discussed with Dr. Collene Gobble of internal medicine teaching service who agrees to admit the patient.  CRITICAL CARE Performed by: Delora Fuel Total critical care time: 45 minutes Critical care time was exclusive of separately billable procedures and treating other patients. Critical care was necessary to treat or prevent imminent or life-threatening deterioration. Critical care was time spent personally by me on the following activities: development of treatment plan with patient and/or surrogate as well as nursing, discussions with consultants, evaluation of patient's response to treatment, examination of patient, obtaining history from patient or surrogate, ordering and performing treatments and interventions, ordering and review of laboratory studies, ordering and  review of radiographic studies, pulse oximetry and re-evaluation of patient's condition.       Final Clinical Impression(s) / ED Diagnoses Final diagnoses:  Acute on chronic combined systolic and diastolic heart failure (HCC)  Elevated troponin  Persistent atrial fibrillation ([place])  Renal insufficiency  Hypokalemia    Rx / DC Orders ED Discharge Orders     None         Delora Fuel, MD 24/26/83 [phone]

## 2021-08-22 NOTE — Progress Notes (Addendum)
° °  Subjective: No acute overnight events.   Patient was seen at bedside during rounds today. Pt reports feeling well today. Pt complains of ongoing SHOB. He denies chest pain and abdominal pain. No questions or concerns. Pt is updated on the plan for today.  Notified later this afternoon by RN that pt has increasing SHOB with HR 150's. He was found to be off of his O2. Pt evaluated at bedside. Saturating 96% on 3L with BP 117/95, and HR 120's. Pt reports to be feeling well, in fact "better than this morning". Suspect these acute symptoms are from being off of his O2 and exertion.   Objective:  Vital signs in last 24 hours: Vitals:   08/22/21 0545 08/22/21 0600 08/22/21 0701 08/22/21 0800  BP: 115/66 (!) 122/99 101/77 133/81  Pulse: (!) 108 (!) 108 (!) 113 99  Resp: 12  17 20   Temp:      TempSrc:      SpO2: 96% 93% 96% 97%  Weight:      Height:       Constitutional: alert, well-appearing, in NAD HENT: normocephalic, atraumatic, mucous membranes moist Cardiovascular: RRR, no m/r/g, 2+ bilateral LE Pulmonary/Chest: labored breathing on 3L O2, crackles in lung bases  Abdominal: soft, non-tender to palpation, non-distended MSK: normal bulk and tone Neurological: A&O x 3 Skin: warm and dry  Assessment/Plan:  Principal Problem:   Acute on chronic heart failure (HCC) Active Problems:   Atrial fibrillation (HCC)   Tobacco abuse   Polycythemia vera (HCC)   COPD (chronic obstructive pulmonary disease) (HCC)   Opioid use disorder  [name] is a 76 y.o. male with multiple pertinent PMH admitted for acute on chronic HRrEF exacerbation and Afib with RVR.   #Acute HFrEF exacerbation #History of diastolic and systolic heart failure with EF 52% 2/2 ischemic cardiomyopathy #History of medication nonadherence Suspect multifactorial from medication noncompliance and dietary indiscretion.  S/p IV Lasix 40 in the ED; remains fluid overload on exam with labored breathing on 3L  supplemental O2.  -IV Lasix 40 bid  -Strict ins and outs -Daily weights -Wean off of O2 as tolerated  -Daily BMPs   #Atrial fibrillation with rapid RVR  #History of a flutter in 2012 status post ablation #Medication nonadherence Patient reports not taking Eliquis or amiodarone. S/p IV metoprolol in the ED. Remains in Afib RVR with rates in 110-140's. Will consult cardiology for help in management, given AoC HF exacerbation limiting BB use.  -Cardiology consulted, appreciate recommendations  -Continue Eliquis 5 mg   #CKD stage III a #Hypokalemia Baseline appears to be near 1.5. Stable.  -Trend BMPs -Electrolyte repletion as needed   #Chronic venous stasis dermatitis, bilateral #PAD -Diuresis as above -Not a candidate for compression wraps given PAD -Tylenol as needed for pain   #Polysubstance abuse -Continue Suboxone 8-2 mg sublingual twice daily -TOC consult for addiction resources -Added hepatic function panel, magnesium -Monitor for signs of withdrawal   #Coronary Artery Disease s/p LAD DES in March 2022 -Continue Plavix 75 mg daily -Restarted Eliquis 5 mg daily -Continue Lipitor 40 mg daily -Follow up with cardiology as an outpatient    #COPD -Dulera daily -Albuterol as needed   #OSA -Consider nightly CPAP   #Prediabetes A1c 6.0% this admission   Best Practice: Diet: Heart Healthy IVF: None,None VTE: Eliquis  Code: Full   [name], MD  Internal Medicine Resident, PGY-1 Pager: [phone] After 5pm on weekdays and 1pm on weekends: On Call pager [phone]

## 2021-08-22 NOTE — TOC Progression Note (Signed)
Transition of Care [facility]) - Progression Note    Patient Details  Name: [name] MRN: [id number] Date of Birth: [birth year]  Transition of Care [facility]) CM/SW Contact  Zenon Mayo, RN Phone Number: 08/22/2021, 12:33 PM  Clinical Narrative:     Transition of Care [facility]) Screening Note   Patient Details  Name: [name] Date of Birth: 26-Mar-1946   Transition of Care [facility]) CM/SW Contact:    Zenon Mayo, RN Phone Number: 08/22/2021, 12:34 PM    TOC  will continue to monitor patient advancement through interdisciplinary progression rounds. If new patient transition needs arise, please place a TOC consult.          Expected Discharge Plan and Services                                                 Social Determinants of Health (SDOH) Interventions    Readmission Risk Interventions Readmission Risk Prevention Plan 05/17/2019 02/13/2019  Transportation Screening Complete Complete  Medication Review [employer] photographer) Complete Complete  PCP or Specialist appointment within 3-5 days of discharge Patient refused Complete  HRI or Home Care Consult Patient refused Complete  SW Recovery Care/Counseling Consult - Complete  [place] Not Applicable Not Applicable  Some recent data might be hidden

## 2021-08-22 NOTE — Evaluation (Addendum)
Occupational Therapy Evaluation Patient Details Name: [name] MRN: [id number] DOB: Oct 08, 1945 Today's Date: 08/22/2021   History of Present Illness 76 yo admitted 2/3 with SOB and CHF. PMhx: CHF, COPD, AFib, NSTEMI, NICM, dermatitis, PAD, HLD, tobacco and ETOH use   Clinical Impression   Pt was seen in the ED. Pt was received sitting EOB, appeared lethargic (keeping head down and eyes shut) but engaged in conversation with therapist. Pt reports PTA, he was living with his significant other and was independence with ADL/IADL and functional mobility. Pt currently requires minA to stand from EOB, further mobility was limited this session secondary to pt with tachy HR up to 130bpm, increased wob and decreased level of arousal, RN notified of pt's status. Recommend d/c to home with [facility] and assistance from his spouse. Will continue to follow acutely and progress as tolerated.      Recommendations for follow up therapy are one component of a multi-disciplinary discharge planning process, led by the attending physician.  Recommendations may be updated based on patient status, additional functional criteria and insurance authorization.   Follow Up Recommendations  Home health OT    Assistance Recommended at Discharge Frequent or constant Supervision/Assistance  Patient can return home with the following A little help with walking and/or transfers;A little help with bathing/dressing/bathroom    Functional Status Assessment  Patient has had a recent decline in their functional status and demonstrates the ability to make significant improvements in function in a reasonable and predictable amount of time.  Equipment Recommendations  Tub/shower bench    Recommendations for Other Services       Precautions / Restrictions Precautions Precautions: Fall Precaution Comments: watch HR Restrictions Weight Bearing Restrictions: No      Mobility Bed Mobility Overal bed mobility:  Needs Assistance Bed Mobility: Sit to Supine       Sit to supine: Min guard   General bed mobility comments: minguard for safety    Transfers Overall transfer level: Needs assistance Equipment used: 1 person hand held assist Transfers: Sit to/from Stand Sit to Stand: Min assist           General transfer comment: minA for sit<>stand from EOB      Balance Overall balance assessment: Needs assistance Sitting-balance support: No upper extremity supported, Feet supported Sitting balance-Leahy Scale: Good Sitting balance - Comments: pt sitting EOB upon arrival,   Standing balance support: Single extremity supported Standing balance-Leahy Scale: Fair Standing balance comment: able to static stand without UE but demonstrated preference for at least single UE support                           ADL either performed or assessed with clinical judgement   ADL Overall ADL's : Needs assistance/impaired Eating/Feeding: Set up;Sitting   Grooming: Set up;Sitting   Upper Body Bathing: Set up;Sitting   Lower Body Bathing: Minimal assistance;Sit to/from stand   Upper Body Dressing : Set up;Sitting   Lower Body Dressing: Minimal assistance;Sit to/from stand   Toilet Transfer: Minimal assistance Armed forces technical officer Details (indicate cue type and reason): to take lateral side steps along EOB         Functional mobility during ADLs: Minimal assistance General ADL Comments: 1 person hand held assistance, pt limited secondary to tachy HR, increased wob 3/4 pt returned to supine and RN notified     Vision         Perception     Praxis  Pertinent Vitals/Pain Pain Assessment Pain Assessment: No/denies pain     Hand Dominance Right   Extremity/Trunk Assessment Upper Extremity Assessment Upper Extremity Assessment: Generalized weakness (grossly 4/5 bilaterally;sensation intact)   Lower Extremity Assessment Lower Extremity Assessment: Defer to PT evaluation        Communication Communication Communication: No difficulties   Cognition Arousal/Alertness: Lethargic Behavior During Therapy: Flat affect, WFL for tasks assessed/performed Overall Cognitive Status: Difficult to assess                                 General Comments: pt oriented to place, month, situation; he initially stated the year was 2013, then stated "23" pt appeared lethargic throughout session, keeping eyes shut but would open them to therapist voice. Pt unable to recall current [place] President. pt appeared to demonstrate limitations with short term memory and decreased awareness of deficits/safety, did not formally assess secondary to level of arousal     General Comments  HR up to 130bpm with standing, SpO2 >88% on 3lnc  RN notified    Exercises     Shoulder Instructions      Home Living Family/patient expects to be discharged to:: Private residence Living Arrangements: Spouse/significant other;Other relatives Available Help at Discharge: Family;Available 24 hours/day Type of Home: House Home Access: Stairs to enter     Home Layout: One level     Bathroom Shower/Tub: Teacher, early years/pre: Standard Bathroom Accessibility: Yes   Home Equipment: Cane - single Associate Professor (2 wheels)   Additional Comments: pt reports use of cane at baseline;pt lives with his "better half" and her 2 grandchildren who are 18y.o and 16y.o      Prior Functioning/Environment Prior Level of Function : Independent/Modified Independent;Driving             Mobility Comments: pt reports using a cane at baseline ADLs Comments: pt reports he was on 2lnc at all times, he reports he was independent with ADL/IADL and functional mobility        OT Problem List: Decreased activity tolerance;Impaired balance (sitting and/or standing);Decreased cognition;Decreased safety awareness;Cardiopulmonary status limiting activity      OT  Treatment/Interventions: Self-care/ADL training;Therapeutic exercise;Energy conservation;DME and/or AE instruction;Cognitive remediation/compensation;Patient/family education;Balance training    OT Goals(Current goals can be found in the care plan section) Acute Rehab OT Goals Patient Stated Goal: to go home OT Goal Formulation: With patient Time For Goal Achievement: 09/05/21 Potential to Achieve Goals: Good ADL Goals Pt Will Perform Grooming: with supervision;standing Pt Will Perform Lower Body Dressing: with supervision;sit to/from stand Pt Will Transfer to Toilet: with supervision;ambulating Additional ADL Goal #1: Pt will demonstrate independence with 3 energy conservation strategies during ADL/functional mobility.  OT Frequency: Min 2X/week    Co-evaluation              AM-PAC OT "6 Clicks" Daily Activity     Outcome Measure Help from another person eating meals?: None Help from another person taking care of personal grooming?: A Little Help from another person toileting, which includes using toliet, bedpan, or urinal?: A Little Help from another person bathing (including washing, rinsing, drying)?: A Little Help from another person to put on and taking off regular upper body clothing?: None Help from another person to put on and taking off regular lower body clothing?: A Little 6 Click Score: 20   End of Session Equipment Utilized During Treatment: Oxygen Nurse Communication: Mobility status  Activity Tolerance: Treatment limited secondary to medical complications (Comment) (tachy HR) Patient left: in bed;with call bell/phone within reach;Other (comment) (bilateral rails elevated)  OT Visit Diagnosis: Other abnormalities of gait and mobility (R26.89);Muscle weakness (generalized) (M62.81);Other symptoms and signs involving cognitive function                Time: 5391-2258 OT Time Calculation (min): 15 min Charges:  OT General Charges $OT Visit: 1 Visit OT  Evaluation $OT Eval Moderate Complexity: [place] OTR/L Acute Rehabilitation Services Office: [place] 08/22/2021, 2:23 PM

## 2021-08-22 NOTE — H&P (Addendum)
Date: 08/22/2021               Patient Name:  [name] MRN: [id number]  DOB: 1945-12-06 Age / Sex: 76 y.o., male   PCP: Marianna Payment, MD         Medical Service: Internal Medicine Teaching Service         Attending Physician: Dr. Aldine Contes, MD    First Contact: Dr. Posey Pronto Pager: [phone]  Second Contact: Dr. Eulas Post Pager: [phone]       After Hours (After 5p/  First Contact Pager: [phone]  weekends / holidays): Second Contact Pager: [phone]   Chief Complaint: SOB  History of Present Illness:  [name] with past medical history of combined systolic and diastolic congestive heart failure, COPD, atrial fibrillation, NSTEMI, nonischemic cardiomyopathy, venous stasis dermatitis, PAD, alcohol misuse, tobacco abuse, alcohol use disorder history of opioid dependence, polycythemia vera with JAK2 mutation, CKD stage III, prediabetes, hyperlipidemia, thoracic aortic aneurysm, history of medication nonadherence, who presents to [facility] ED with shortness of breath.  Patient had hospitalization October 2022 for chronic venous stasis dermatitis and HFrEF.  Patient reports shortness of breath over the last few days which has worsened in the last day.  Patient unable to provide detail.  Denies orthopnea.  Not on home oxygen.  Denies chest pain, flulike symptoms, cough, fever.  Brings his medications, atorvastatin, Lasix, Plavix, Suboxone.  He is not aware of any other medications he is supposed to be on.  Not sure of any other medical conditions he has aside from heart failure.  Reports lower extremity swelling has worsened in the last 5 days.  Reports pain of bilateral lower extremities.  Per chart review, patient had soft pressures at last admission, was admitted for possible lower extremity cellulitis though this was determined to be venous stasis dermatitis.  Was also diuresed with IV Lasix for hypervolemia due to HFrEF.  Patient was also referred to vascular  surgery for PAD after ABIs consistent with PAD bilaterally.  Weight at discharge 96kg.  Weight today 105 kilograms. Reports nonadherence to dietary recommendations for heart failure.  ED course: On arrival, patient afebrile tachycardic to 120s and normotensive with desaturation to 80% on room air, placed on 2 L [place] with improvement in saturations.  CXR with stable cardiomegaly, possible pulmonary congestion.  BNP elevated from prior to 953.  COVID and flu negative.  Patient given 40 IV Lasix in the ED and IV metoprolol 2.5 mg.  IMTS called for admission.   Meds:  Current Outpatient Medications  Medication Instructions   acetaminophen (TYLENOL) 1,000 mg, Oral, Every 6 hours PRN   albuterol (PROAIR HFA) 108 (90 Base) MCG/ACT inhaler 1-2 puffs, Inhalation, Every 6 hours PRN   amiodarone (PACERONE) 200 mg, Oral, Daily   apixaban (ELIQUIS) 5 mg, Oral, 2 times daily   aspirin 81 mg, Oral, Daily   atorvastatin (LIPITOR) 80 mg, Oral, Daily   camphor-menthol (SARNA) lotion Topical, 3 times daily   clopidogrel (PLAVIX) 75 mg, Oral, Daily with breakfast   furosemide (LASIX) 40 mg, Oral, Daily   SUBOXONE 8-2 MG FILM 2 Film, Sublingual, Daily   SYMBICORT 160-4.5 MCG/ACT inhaler 2 puffs, Inhalation, Daily at bedtime    Allergies: Allergies as of 08/22/2021   (No Known Allergies)   Past Medical History:  Diagnosis Date   Acute lower GI bleeding 02/15/2017   Atrial fibrillation (HCC)    Cardiomyopathy    EF55% 11/14<<35%    CHF (congestive  heart failure) (HCC)    COPD (chronic obstructive pulmonary disease) (HCC)    Difficult airway for intubation    per telephone encounter notation   Essential hypertension    GERD (gastroesophageal reflux disease)    Gout    Hepatitis    Possible history   History of atrial flutter    Ablation 2012   Myeloproliferative neoplasm (HCC) 08/15/2013   Obesity    Pneumonia X 1   Primary polycythemia (HCC) 06/12/2013   Renal insufficiency    Substance abuse  (HCC)    History of alcohol; hx cocaine, heroin, crack use   Syncope    Tubular adenoma of colon 08/2014    Family History:  Family History  Problem Relation Age of Onset   Diabetes Mother    Hypertension Mother    Cirrhosis Father    Alcohol abuse Father    Colon cancer Neg Hx    Rectal cancer Neg Hx    Stomach cancer Neg Hx    Esophageal cancer Neg Hx    Colon polyps Neg Hx    Social History:  Patient lives in [place] with a friend and grandchildren.  Reports smokes 1 to 2 cigarettes daily.  Reports drinks from 2-3 mixed drinks every other day.  Denies other illicit drug use.  Review of Systems: A complete ROS was negative except as per HPI.   Physical Exam: Blood pressure 103/77, pulse 81, temperature 97.6 F (36.4 C), temperature source Oral, resp. rate 11, height 5\' 6"  (1.676 m), weight 105 kg, SpO2 96 %. Physical Exam: General: Chronically ill-appearing obese male, NAD HENT: normocephalic, atraumatic EYES: conjunctiva non-erythematous, no scleral icterus CV: Tachycardic, irregular rate, no murmurs, rubs, gallops.  2+ pitting edema bilateral lower extremities. Pulmonary: Increased work of breathing on 2 LNC, transmitted upper airway sounds bilaterally, possible trace crackles right lung base, no wheezing Abdominal: non-distended, soft, non-tender to palpation, normal BS Skin: Warm and dry, chronic venous stasis skin changes bilateral lower extremities with small ulcerations Neurological: MS: awake, alert and oriented x3, normal speech and fund of knowledge Motor: moves all extremities antigravity Psych: normal affect  CBC    Component Value Date/Time   WBC 11.7 (H) 08/22/2021 0241   RBC 4.86 08/22/2021 0241   HGB 15.3 08/22/2021 0241   HGB 16.3 11/27/2020 1515   HGB 13.7 07/23/2017 0837   HCT 47.3 08/22/2021 0241   HCT 48.7 11/27/2020 1515   HCT 39.3 07/23/2017 0837   PLT 251 08/22/2021 0241   PLT 256 11/27/2020 1515   MCV 97.3 08/22/2021 0241   MCV 91  11/27/2020 1515   MCV 125.2 (H) 07/23/2017 0837   MCH 31.5 08/22/2021 0241   MCHC 32.3 08/22/2021 0241   RDW 15.3 08/22/2021 0241   RDW 14.4 11/27/2020 1515   RDW 13.5 07/23/2017 0837   LYMPHSABS 1.1 04/25/2021 1130   LYMPHSABS 1.2 07/23/2017 0837   MONOABS 0.9 04/25/2021 1130   MONOABS 0.3 07/23/2017 0837   EOSABS 0.2 04/25/2021 1130   EOSABS 0.1 07/23/2017 0837   BASOSABS 0.1 04/25/2021 1130   BASOSABS 0.0 07/23/2017 0837   CMP     Component Value Date/Time   NA 142 08/22/2021 0241   NA 142 11/27/2020 1515   NA 143 06/12/2013 1125   K 3.4 (L) 08/22/2021 0241   K 3.7 06/12/2013 1125   CL 106 08/22/2021 0241   CO2 23 08/22/2021 0241   CO2 32 (H) 06/12/2013 1125   GLUCOSE 119 (H) 08/22/2021 0241  GLUCOSE 92 06/12/2013 1125   BUN 28 (H) 08/22/2021 0241   BUN 24 11/27/2020 1515   BUN 14.6 06/12/2013 1125   CREATININE 1.54 (H) 08/22/2021 0241   CREATININE 1.05 08/30/2014 1628   CREATININE 0.9 06/12/2013 1125   CALCIUM 8.5 (L) 08/22/2021 0241   CALCIUM 8.9 06/12/2013 1125   PROT 6.8 04/25/2021 1130   PROT 7.5 07/16/2020 1357   PROT 6.9 06/12/2013 1125   ALBUMIN 3.1 (L) 04/25/2021 1130   ALBUMIN 3.8 07/16/2020 1357   ALBUMIN 3.3 (L) 06/12/2013 1125   AST 16 04/25/2021 1130   AST 21 06/12/2013 1125   ALT 14 04/25/2021 1130   ALT 28 06/12/2013 1125   ALKPHOS 106 04/25/2021 1130   ALKPHOS 112 06/12/2013 1125   BILITOT 1.1 04/25/2021 1130   BILITOT 0.4 07/16/2020 1357   BILITOT 0.82 06/12/2013 1125   GFRNONAA 47 (L) 08/22/2021 0241   GFRNONAA 73 08/30/2014 1628   GFRAA 61 07/16/2020 1357   GFRAA 84 08/30/2014 1628   Resp Panel by RT-PCR (Flu A&B, Covid) Nasopharyngeal Swab [[id number]] Collected: 08/22/21 0301  Specimen: Nasopharyngeal(NP) swabs in vial transport medium from Nasopharyngeal Swab Updated: 08/22/21 0350   SARS Coronavirus 2 by RT PCR NEGATIVE   Influenza A by PCR NEGATIVE   Influenza B by PCR NEGATIVE   Brain natriuretic peptide [[id number]]  (Abnormal) Collected: 08/22/21 0241  Specimen: Blood from Vein Updated: 08/22/21 0327   B Natriuretic Peptide 953.7 High  pg/mL     EKG: personally reviewed my interpretation is atrial fibrillation with RVR  CXR: personally reviewed my interpretation is stable cardiomegaly with possible pulmonary congestion  DG Chest Port 1 View  Result Date: 08/22/2021 CLINICAL DATA:  Shortness of breath EXAM: PORTABLE CHEST 1 VIEW COMPARISON:  04/26/2021 FINDINGS: Cardiac shadow is enlarged but stable. Aortic calcifications are noted. Loop recorder is again seen. Lungs are clear. No acute bony abnormality is noted. IMPRESSION: Stable cardiomegaly. No other focal abnormality is noted. Electronically Signed   By: Inez Catalina M.D.   On: 08/22/2021 03:28     Assessment & Plan by Problem: Principal Problem:   Acute on chronic heart failure (HCC)  #Acute HFrEF exacerbation #History of diastolic and systolic heart failure with EF 52% 2/2 ischemic cardiomyopathy #History of medication nonadherence Patient presents with couple day history of shortness of breath and bilateral lower extremity swelling.  BNP elevated from prior to 953, has gained weight since last discharge (~9 kg), CXR with cardiomegaly and possible pulmonary congestion.  Patient is a poor historian.  Endorses adherence to furosemide 40 mg daily.  Reports nonadherence to dietary recommendations for heart failure.  Currently comfortable on 2 L [place], not on oxygen at baseline. Hypotension has limited GDMT. Plan: -S/p 1 dose IV Lasix 40 mg in the ED -Can decide in the a.m. if want to continue with IV diuresis versus restarting home furosemide -Strict ins and outs -Daily weights -Continuous pulse ox, O2 supplementation as needed -Daily BMPs  #Atrial fibrillation with rapid RVR #History of a flutter in 2012 status post ablation #Medication nonadherence Patient reports not taking Eliquis or amiodarone.  Has history of medication nonadherence.  EKG  shows atrial fibrillation with rapid RVR with HR of 142.  Heart rate remains elevated in the 120s with irregular rhythm on exam.  Patient denies chest pain, initial troponin 30, repeat 31, flat.  Patient received dose of metoprolol IV 2.5 mg in the ED, currently not rate or rhythm controlled.  Patient's pressures have been soft  which may be due to A. fib. Plan: -Restarted Eliquis 5 mg -Status post IV metoprolol in the ED -Can restart amiodarone today  #CKD stage III a #Hypokalemia On arrival BMP with creatinine 1.54 which appears stable from 3 months ago 1.51.  BUN 28.  eGFR 47.  Potassium mildly low at 3.4. Plan: -Daily BMPs -Electrolyte repletion as needed -Monitor kidney function while undergoing diuresis  #Chronic venous stasis dermatitis, bilateral #PAD On exam, bilateral lower extremity erythema, 2+ pitting edema bilaterally, small ulcerations and previous healed lesions.  Patient endorses pain and swelling of bilateral lower extremities.  ABIs obtained during last admission October 2022 and bilateral lower extremity arteries were noncompressible.  Patient had been referred to outpatient vascular surgery but has no appointment scheduled.  Considered cellulitis given low blood pressures and erythema, however patient has been afebrile and erythema and pain affect bilateral lower extremities with known chronic venous stasis dermatitis. Plan: -Diuresis as above -Not a candidate for compression wraps given PAD -Tylenol as needed for pain  #Polysubstance abuse Patient has history of alcoholism, tobacco use, history of opioid dependence.  Currently smokes 1 to 2 cigarettes daily, 2-3 rum mixed drinks every other day, on Suboxone which has been getting refilled by Jerrol Banana, Md who is a psychiatrist in [place].  No concern for withdrawal at this time.  Need to verify time of last drink. Plan: -Continue Suboxone 8-2 mg sublingual twice daily -TOC consult for addiction  resources -Added hepatic function panel, magnesium -Monitor for signs of withdrawal  #Coronary Artery Disease s/p LAD DES Patient had drug-eluting stent placed in LAD in March 2022 and started on Plavix and aspirin for 30-day course followed by Plavix monotherapy.  Patient currently taking Plavix only, no Eliquis.  Was supposed to have follow-up with cardiology following discharge in October 2022 but no appointment made.  Denies chest pain, troponins flat, EKG without ischemic changes. Plan: -Continue Plavix 75 mg daily -Restart Eliquis 5 mg daily -Continue Lipitor 40 mg daily  #COPD PFTs completed 06/2019 FEV1 61%.  Patient reports has albuterol and other inhalers at home.  He is only using these as needed.  Continues to smoke.  No wheezing on exam. Plan: -Dulera daily -Albuterol as needed  #OSA On chart review patient had home sleep test December 2020 which was consistent with severe obstructive sleep apnea.  Unclear if patient is on CPAP. Plan: -Consider nightly CPAP  #Prediabetes Last hemoglobin A1c October/2022 was 6.0%.  Blood sugars appropriate so far this admission. Plan: -Hemoglobin A1c  Diet: Heart VTE: Eliquis IVF: None Code: Full  Disposition: Admit patient to Observation with expected length of stay less than 2 midnights.  Portions of this report may have been transcribed using voice recognition software. Every effort was made to ensure accuracy; however, inadvertent computerized transcription errors may be present.   Signed: Wayland Denis, MD 08/22/2021, 5:37 AM  Pager: [phone] After 5pm on weekdays and 1pm on weekends: On Call pager: [phone]

## 2021-08-22 NOTE — Consult Note (Addendum)
Cardiology Consultation:   Patient ID: [name] MRN: [id number]; DOB: 07-Dec-1945  Admit date: 08/22/2021 Date of Consult: 08/22/2021  PCP:  Marianna Payment, MD   [facility] Providers Cardiologist:  Sinclair Grooms, MD  Electrophysiologist:  Cristopher Peru, MD  {   Patient Profile:   [name] is a 76 y.o. male with a history of CAD s/p DES to LAD in 09/2020, nonischemic cardiomyopathy/chronic systolic CHF with EF as low as 35% in 2012 in the setting of polysubstance abuse with normalization of EF but back down to 40-45% on last Echo in 09/2020, paroxysmal atrial fibrillation/flutter s/p ablation in 2012 on Amiodarone and Eliquis, ascending aortic aneurysm, hypertension, CKD stage III, COPD, obstructive sleep apnea not compliant with CPAP, GERD, polycythemia vera, polysubstance abuse (tobacco, alcohol, heroin, cocaine) on Suboxone, and medication non-compliance who was admitted with acute on chronic CHF and atrial fibrillation with RVR on 08/22/2021 after presenting with shortness of breath. Cardiology consulted for assistance at the request of Dr. Dareen Piano.   History of Present Illness:   [name] is a 76 year old male with the above history who is followed by Dr. Tamala Julian Dr. Lovena Le.  He has a long history of nonischemic cardiomyopathy with a EF of 35% in 2012 this was be secondary to polysubstance abuse including alcohol, heroin, cocaine abuse. She is on Suboxone.  EF later normalized to 60-65%.  However, she was admitted with NSTEMI in 09/2020 and echo at that time showed LVEF of 40-45% with global hypokinesis, moderate to severe MR, mild to moderate TR, moderate AI, and mildly elevated PASP of 39.6 mmHg.  Right/left cardiac catheterization at that time showed 95% stenosis of proximal LAD with elevated rash and left heart pressures.  He underwent successful PCI with DES to LAD lesion.  Patient also has a history of paroxysmal atrial fibrillation/flutter with  prior flutter ablation in 2012 and has been maintained on Amiodarone and Eliquis.  Patient was last seen by Estella Husk, PA-C, in 10/2020 at which time she was doing well with no cardiac complaints. He was admitted in 04/2021 for chronic venous stasis dermatitis after running out of Lasix and was treated with antibiotics and diuresis with improvement.[name]  ABIs at that time were non-compressible so he was suspected to have PAD and referred to Vascular Surgery but has not been seen by them yet.   Patient presents to the ED today for further evaluation of shortness of breath. Patient reports worsening shortness of breath for the past several weeks. Initially, he reported dyspnea with exertion but it progressed to at rest as well. He has been experiencing orthopnea and PND for at least the past week. He also reports worsening lower extremity edema over the past several months.  He also reports pain around his right anterior ankle. He denies any obvious weight gain or abdominal bloating. He denies any chest pain. He denies any palpitations and is completely unaware that he is in rapid atrial fibrillation. He notes occasional lightheadedness/dizziness with standing quickly but no syncope. No recent fevers or illness. No cough, nasal congestion, abdominal pain, nausea/vomiting. No abnormal bleeding in urine or stools. He states he is compliant with all of his cardiac medications but per H&P he has not been taking Eliquis or Amiodarone (he brought in all the medications he was taking at home and Eliquis and Amiodarone were not included in this). I personally called his Pharmacy. He has not refilled his medications since October 2022 (all of the prescriptions were  for 30 days except for the Plavix which was for 90 days).  Upon arrival to the ED, patient hypertensive and tachycardic. EKG shows atrial fibrillation, rate 142 bpm, with nonspecific T wave changes. High-sensitivity troponin minimally elevated and flat at 30  >> 31.  BNP elevated at 953.  Chest x-ray showed no acute findings. WBC 11.7, Hgb 15.3, Plts 251. Na 142, K 3.4, Glucose 119, BUN 28, Cr 1.54. Albumin 3.1, AST 29, ALT 23, Alk Phos 152, Total Bili 0.7. Respiratory panel negative for COVID and influenza A/B.  Patient was given a dose of IV metoprolol and started on IV Lasix and Eliquis was restarted.  He was admitted and Cardiology was consulted for further evaluation. ABIs at that time were non-compressible so he was suspected to have PAD and referred to Vascular Surgery but has not been seen by them yet.   At the time of this evaluation, patient is sitting leaning over the bedside table. When he tries to sit back, he becomes visibly short of breath and heart rate increases.  Patient reports continued tobacco use. He currently smokes 1-2 cigarettes per day but states he knows he needs to quick. He also notes occasional alcohol use but denies any drug use.  Past Medical History:  Diagnosis Date   Acute lower GI bleeding 02/15/2017   Atrial fibrillation (HCC)    Cardiomyopathy    EF55% 11/14<<35%    CHF (congestive heart failure) (HCC)    COPD (chronic obstructive pulmonary disease) ([place])    Difficult airway for intubation    per telephone encounter notation   Essential hypertension    GERD (gastroesophageal reflux disease)    Gout    Hepatitis    Possible history   History of atrial flutter    Ablation 2012   Myeloproliferative neoplasm ([place]) 08/15/2013   Obesity    Pneumonia X 1   Primary polycythemia ([place]) 06/12/2013   Renal insufficiency    Substance abuse ([place])    History of alcohol; hx cocaine, heroin, crack use   Syncope    Tubular adenoma of colon 08/2014    Past Surgical History:  Procedure Laterality Date   CARDIAC ELECTROPHYSIOLOGY MAPPING AND ABLATION  08/2010   [name] 09/07/2010 (12/14/2012)   COLONOSCOPY     15-20 years ago had colon in [place] N/A 02/12/2020   Procedure: COLONOSCOPY WITH  PROPOFOL;  Surgeon: Ladene Artist, MD;  Location: [facility];  Service: Endoscopy;  Laterality: N/A;   CORONARY STENT INTERVENTION N/A 10/10/2020   Procedure: CORONARY STENT INTERVENTION;  Surgeon: Lorretta Harp, MD;  Location: [place] [facility];  Service: Cardiovascular;  Laterality: N/A;   EXCISIONAL HEMORRHOIDECTOMY  1970's   LOOP RECORDER IMPLANT N/A 08/23/2013   Procedure: LOOP RECORDER IMPLANT;  Surgeon: Deboraha Sprang, MD;  Location: [facility] CATH LAB;  Service: Cardiovascular;  Laterality: N/A;   MULTIPLE EXTRACTIONS WITH ALVEOLOPLASTY Bilateral 01/24/2016   Procedure: MULTIPLE EXTRACTION WITH ALVEOLOPLASTY BILATERAL;  Surgeon: Diona Browner, DDS;  Location: [facility];  Service: Oral Surgery;  Laterality: Bilateral;   MULTIPLE TOOTH EXTRACTIONS  01/24/2016   MULTIPLE EXTRACTION WITH ALVEOLOPLASTY BILATERAL (Bilateral)   POLYPECTOMY  02/12/2020   Procedure: POLYPECTOMY;  Surgeon: Ladene Artist, MD;  Location: [facility];  Service: Endoscopy;;   RIGHT/LEFT HEART CATH AND CORONARY ANGIOGRAPHY N/A 10/09/2020   Procedure: RIGHT/LEFT HEART CATH AND CORONARY ANGIOGRAPHY;  Surgeon: Burnell Blanks, MD;  Location: [place] [facility];  Service: Cardiovascular;  Laterality: N/A;  Home Medications:  Prior to Admission medications   Medication Sig Start Date End Date Taking? Authorizing Provider  acetaminophen (TYLENOL) 500 MG tablet Take 1,000 mg by mouth every 6 (six) hours as needed for mild pain.   Yes [provider]  albuterol (PROAIR HFA) 108 (90 Base) MCG/ACT inhaler Inhale 1-2 puffs into the lungs every 6 (six) hours as needed for wheezing or shortness of breath. 01/11/19  Yes [name], MD  atorvastatin (LIPITOR) 80 MG tablet Take 1 tablet (80 mg total) by mouth daily. 04/26/21  Yes Jose Persia, MD  camphor-menthol [facility]) lotion Apply topically 3 (three) times daily. Patient taking differently: Apply 1 application topically daily as needed for itching. 02/13/19   Yes Hall, Carole N, DO  SUBOXONE 8-2 MG FILM Place 2 Film under the tongue daily. 08/21/15  Yes [provider]  SYMBICORT 160-4.5 MCG/ACT inhaler Inhale 2 puffs into the lungs at bedtime. 07/02/21  Yes Marianna Payment, MD  amiodarone (PACERONE) 200 MG tablet Take 1 tablet (200 mg total) by mouth daily. 04/29/21   Marianna Payment, MD  apixaban (ELIQUIS) 5 MG TABS tablet Take 1 tablet (5 mg total) by mouth 2 (two) times daily. 04/26/21   Jose Persia, MD  aspirin 81 MG chewable tablet Chew 1 tablet (81 mg total) by mouth daily. 04/27/21   Jose Persia, MD  furosemide (LASIX) 40 MG tablet Take 1 tablet (40 mg total) by mouth daily. 04/29/21   Marianna Payment, MD    Inpatient Medications: Scheduled Meds:  apixaban  5 mg Oral BID   [START ON 08/23/2021] atorvastatin  80 mg Oral Daily   buprenorphine-naloxone  1 tablet Sublingual BID   clopidogrel  75 mg Oral Daily   furosemide  80 mg Intravenous BID   mometasone-formoterol  2 puff Inhalation BID   Continuous Infusions:  PRN Meds: acetaminophen **OR** acetaminophen, albuterol, senna-docusate  Allergies:   No Known Allergies  Social History:   Social History   Socioeconomic History   Marital status: Married    Spouse name: [name]   Number of children: 1   Years of education: Not on file   Highest education level: 11th grade  Occupational History   Occupation: Retired  Tobacco Use   Smoking status: Some Days    Packs/day: 1.50    Years: 50.00    Pack years: 75.00    Types: Cigarettes   Smokeless tobacco: Never   Tobacco comments:    1-2 cigarettes a day for a couple years now. encouraged cessation  Vaping Use   Vaping Use: Never used  Substance and Sexual Activity   Alcohol use: Yes    Alcohol/week: 2.0 standard drinks    Types: 2 Shots of liquor per week    Comment: Sometimes.   Drug use: Not Currently    Types: Heroin    Comment: stopped use in 2015. currently on suboxone   Sexual activity: Not Currently  Other  Topics Concern   Not on file  Social History Narrative   Moved here from [place]. Lives with common law wife and grandchildren. 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."   Social Determinants of Radio broadcast assistant Strain: Low Risk    Difficulty of Paying Living Expenses: Not hard at all  Food Insecurity: No Food Insecurity   Worried About Charity fundraiser in the Last Year: Never true   Arboriculturist in the Last Year: Never true  Transportation Needs: No Transportation Needs  Lack of Transportation (Medical): No   Lack of Transportation (Non-Medical): No  Physical Activity: Not on file  Stress: Not on file  Social Connections: Not on file  Intimate Partner Violence: Not on file    Family History:   Family History  Problem Relation Age of Onset   Diabetes Mother    Hypertension Mother    Cirrhosis Father    Alcohol abuse Father    Colon cancer Neg Hx    Rectal cancer Neg Hx    Stomach cancer Neg Hx    Esophageal cancer Neg Hx    Colon polyps Neg Hx      ROS:  Please see the history of present illness.  .Review of Systems  Constitutional:  Negative for fever.  HENT:  Negative for congestion.   Respiratory:  Positive for shortness of breath. Negative for cough.   Cardiovascular:  Positive for orthopnea, leg swelling and PND. Negative for chest pain and palpitations.  Gastrointestinal:  Negative for abdominal pain, blood in stool, melena, nausea and vomiting.  Genitourinary:  Negative for hematuria.  Musculoskeletal:  Negative for myalgias.  Skin:  Positive for rash (chronic venous insufficiency skin changes).  Neurological:  Positive for dizziness. Negative for loss of consciousness.  Endo/Heme/Allergies:  Does not bruise/bleed easily.  Psychiatric/Behavioral:  Positive for substance abuse.    Physical Exam/Data:   Vitals:   08/22/21 0701 08/22/21 0800 08/22/21 0900 08/22/21 1115  BP: 101/77 133/81 (!) 124/92 (!) 117/95  Pulse: (!) 113 99 62 (!) 122  Resp:  _0 Temp:      TempSrc:      SpO2: 96% 97% 96% 96%  Weight:      Height:       No intake or output data in the 24 hours ending 08/22/21 1524 Last 3 Weights 08/22/2021 04/26/2021 04/25/2021  Weight (lbs) 231 lb 7.7 oz 212 lb 4.9 oz 200 lb  Weight (kg) 105 kg 96.3 kg 90.719 kg     Body mass index is 37.36 kg/m.  General: 76 y.o. male resting comfortably in mild respiratory distress if he lays back. HEENT: Normocephalic and atraumatic. Sclera clear.  Neck: Supple. JVD difficult to assess due to neck size. Heart: Tachycardic with irregularly irregular rhythm. Soft murmur. No gallops or rubs. Radial pulses 2+ and equal bilaterally. Lungs: No increased work of breathing. Clear to ausculation bilaterally. No wheezes, rhonchi, or rales.  Abdomen: Soft, distended, and non-tender. Bowel sounds present. Extremities: 2-3+ pitting edema of bilateral lower extremities extending up past the knee. Skin: Warm and dry. Hyperpigmentation and several wounds on bilateral anterior legs consistent with chronic venous insufficiency. Neuro: Alert and oriented x3. No focal deficits. Psych: Normal affect. Responds appropriately.  EKG:  The EKG was personally reviewed and demonstrates: Atrial fibrillation, rate 142 bpm, with nonspecific T wave changes.  Telemetry:  Telemetry was personally reviewed and demonstrates:  Atrial fibrillation/flutter with rates in the 110s to 120s (as high as the 140s when he gets acutely short of breath).  Relevant CV Studies:  Echocardiogram 10/09/2020: Impressions:  1. Left ventricular ejection fraction, by estimation, is 40 to 45%. The  left ventricle has mildly decreased function. The left ventricle  demonstrates global hypokinesis. The left ventricular internal cavity size  was mildly dilated. There is mild left  ventricular hypertrophy. Left ventricular diastolic parameters are  indeterminate.   2. Right ventricular systolic function is normal. The right ventricular   size is normal. There is mildly elevated pulmonary artery  systolic  pressure. The estimated right ventricular systolic pressure is 09.3 mmHg.   3. The mitral valve is normal in structure. Moderate to severe mitral  valve regurgitation. Central MR jet, appears functional.   4. Tricuspid valve regurgitation is mild to moderate.   5. The aortic valve is tricuspid. Aortic valve regurgitation is moderate.  Mild to moderate aortic valve sclerosis/calcification is present, without  any evidence of aortic stenosis.   6. Aortic dilatation noted. Aneurysm of the ascending aorta, measuring 49  mm.   7. 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  _______________  Right/Left Cardiac Catheterization 10/09/2020: Prox LAD lesion is 95% stenosed.   1. Severe stenosis proximal to mid LAD 2. No obstructive disease in the RCA or Circumflex atery 3. Elevated right and left heart pressures.  _______________  Coronary Stent Intervention 10/10/2021: Prox LAD to Mid LAD lesion is 95% stenosed. A drug-eluting stent was successfully placed using a STENT RESOLUTE ONYX 3.5X22. Post intervention, there is a 0% residual stenosis.  Impression: Successful proximal LAD PCI drug-eluting stenting using a Medtronic Onyx drug-eluting stent postdilated to 4.1 mm.  The patient was somewhat agitated at the end of the procedure.  I did use a long sheath because of iliac tortuosity.  He was already on aspirin Plavix.  I have elected to place the patient oriented to age for posterior groin management.  The long sheath was exchanged over an 035 wire for short 6 French sheath which was then secured in place. The patient left lab in stable condition.  Diagnostic Dominance: Right Intervention   Laboratory Data:  High Sensitivity Troponin:   Recent Labs  Lab 08/22/21 0241 08/22/21 0455  TROPONINIHS 30* 31*     Chemistry Recent Labs  Lab  08/22/21 0241  NA 142  K 3.4*  CL 106  CO2 23  GLUCOSE 119*  BUN 28*  CREATININE 1.54*  CALCIUM 8.5*  GFRNONAA 47*  ANIONGAP 13    Recent Labs  Lab 08/22/21 0241  PROT 7.2  ALBUMIN 3.1*  AST 29  ALT 23  ALKPHOS 152*  BILITOT 0.7   Lipids No results for input(s): CHOL, TRIG, HDL, LABVLDL, LDLCALC, CHOLHDL in the last 168 hours.  Hematology Recent Labs  Lab 08/22/21 0241  WBC 11.7*  RBC 4.86  HGB 15.3  HCT 47.3  MCV 97.3  MCH 31.5  MCHC 32.3  RDW 15.3  PLT 251   Thyroid No results for input(s): TSH, FREET4 in the last 168 hours.  BNP Recent Labs  Lab 08/22/21 0241  BNP 953.7*    DDimer No results for input(s): DDIMER in the last 168 hours.   Radiology/Studies:  DG Chest Port 1 View  Result Date: 08/22/2021 CLINICAL DATA:  Shortness of breath EXAM: PORTABLE CHEST 1 VIEW COMPARISON:  04/26/2021 FINDINGS: Cardiac shadow is enlarged but stable. Aortic calcifications are noted. Loop recorder is again seen. Lungs are clear. No acute bony abnormality is noted. IMPRESSION: Stable cardiomegaly. No other focal abnormality is noted. Electronically Signed   By: Inez Catalina M.D.   On: 08/22/2021 03:28     Assessment and Plan:   Acute on Chronic Systolic CHF Non-Ischemic Cardiomyopathy Initially diagnosed in 2012.  EF 35% at that time.  Felt to be secondary to polysubstance abuse and EF ultimately normalized.  However, LVEF back down to 40-45% on last echo in 09/2020.  He now presents with worsening shortness of breath and  lower extremity edema in the setting of medication non-compliance. He has not refilled his Lasix since October. BNP elevated at 953.  Chest x-ray showed no acute findings. He was started on IV Lasix but no documented output so far. - Patient clearly volume overload on exam and cannot sit back in bed without getting short of breath. He is currently leaning over bedside table. - Will repeat Echo. Suspect EF is going to be worse than it was in 09/2020. -  Will check urine drug screen. - Continue increase IV Lasix to 36m twice daily. Suspect he will need Torsemide at discharge given significant gut edema. - BP soft at times so will hold off on adding any GDMT for now to allow for more diuresis. - Continue to monitor daily weights, strict I/Os, and renal function.  Paroxysmal Atrial Fibrillation/Flutter History of paroxysmal atrial fibrillation and flutter. S/p flutter ablation in 2012.  Presented in rapid atrial fibrillation/flutter with with rates in the 140s. Potassium 3.4. Magnesium pending. Patient had not been taking home Amiodarone or Eliquis. - Rates currently in the 110s to 120s. - Will check TSH. - Will start IV Amiodarone given soft pressures. - CHA2DS-VASc = 4 (CHF, CAD, HTN, age). Home Eliquis 548mtwice daily restarted. - Suspect he will ultimately need TEE/DCCV this admission.  CAD S/p DES to LAD in 09/2020. High-sensitivity troponin minimally elevated and flat in the 30s consistent with demand ischemia from acute CHF and rapid atrial fibrillation. - No angina. - Continue Plavix monotherapy. Not on Aspirin due to DOAC. - Continue high-intensity statin.  Hypertension BP soft at times but stable. - Continue medications for CHF as above.  Hyperlipidemia LDL 83 in 3.2022.  - Continue home Lipitor 8034maily. - Repeat fasting lipid panel in the morning.  CKD Stage III Creatinine 1.54 on admission. Baseline between 1.4 to 1.6. - Continue to monitor.  Hypokalemia Potassium 3.4 on admission. Repleted by primary team. - Continue to monitor.   Otherwise, per primary team: - COPD - Obstructive sleep apnea on CPAP - Prediabetes  - Polysubstance abuse   Risk Assessment/Risk Scores:   New York Heart Association (NYHA) Functional Class NYHA Class IV  CHA2DS2-VASc Score = 4  This indicates a 4.8% annual risk of stroke. The patient's score is based upon: CHF History: 1 HTN History: 1 Diabetes History: 0 Stroke History:  0 Vascular Disease History: 1 Age Score: 1 Gender Score: 0   For questions or updates, please contact [email or web address] Please consult [email or web address] for contact info under    Signed, CalDarreld McleanA-C  08/22/2021 3:24 PM  Personally seen and examined. Agree with APP above with the following comments: Briefly [age] M   with a history of CAD with LAD PCI 2022, HFrEF last EF  ~35% with secondary MR, AF and AFL s/p 2012 CTI ablation, OSA on CPAP, Morbid Obesity and HLD, history complicated by hx of polysubstance abuse an SDH-associated medication non adherence (has not picked up his medications in some time) who presents with decompensated heart failure.  Patient notes that he has not felt well in sometime.  In July he was able to breath and lay flat.  Has had spells without his lasix, eliquis, or his amiodarone.  Had recent admission in 10/22 related to LE edema.  Things have progressed to where he cannot lay flat.  He endorses leg swelling but notes he has not gained any weight.  No CP.  No palpitations ( he is out of rhythm on  exam).  Notes that he does take his medications and no long uses illicit substances.  Exam notable for IRIR tachycardia, high as 130.  Distant heart sounds with holosystolic murmur. He is unable to lat flat.  Abdominal distention with dullness to percussion.  I increased his O2 to 5 L to achiever O2 sats of 94% Has bilateral pitting edema  Labs notable for BNP 953, troponin < 100 X2. Creatinine of 1.5 appears to be his baseline.  Increased alk phos but no evidence of shock liver Personally reviewed relevant tests; appears to be in atypical flutter with variable conduction and RVR, vs AF Would recommend  - repeat echo, I worry his LV function is worse - restart eliquis; we may need to start heparin at somepoint during this admission for heart catheterization, but he is reasonable compensated and goal would be to cath when appears euvolemic - urine drug screen, hold  addition of BB for now - lasix 80 IV BID, planned for torsemide at DC - Based on K and Cr, will add aldactone 25 mg PO Daily next; then potential SGLT2i; I worry his BP will not tolerate ARNI - at DC will need TOC support  - starting IV amiodarone: cannot start CCB and I suspect given his comorbidities it is unlikely that he will chemically cardiovert - CMP in AM, low threshold for AHF consult.  Rudean Haskell, MD [place], [address] [place], [place] [address] [phone]  3:53 PM

## 2021-08-22 NOTE — Progress Notes (Signed)
PT Cancellation Note  Patient Details Name: [name] MRN: [id number] DOB: [birth year]   Cancelled Treatment:    Reason Eval/Treat Not Completed: Patient not medically ready (pt sitting EOB with HR 130-150 and not currently appropriate for mobility)   [name] 08/22/2021, 1:22 PM [place] Pager: [phone] Office: [phone]

## 2021-08-23 ENCOUNTER — Inpatient Hospital Stay (HOSPITAL_COMMUNITY): Payer: Medicare Other

## 2021-08-23 DIAGNOSIS — R7303 Prediabetes: Secondary | ICD-10-CM

## 2021-08-23 DIAGNOSIS — I5023 Acute on chronic systolic (congestive) heart failure: Secondary | ICD-10-CM

## 2021-08-23 DIAGNOSIS — E785 Hyperlipidemia, unspecified: Secondary | ICD-10-CM

## 2021-08-23 DIAGNOSIS — I428 Other cardiomyopathies: Secondary | ICD-10-CM

## 2021-08-23 DIAGNOSIS — J449 Chronic obstructive pulmonary disease, unspecified: Secondary | ICD-10-CM

## 2021-08-23 DIAGNOSIS — N1831 Chronic kidney disease, stage 3a: Secondary | ICD-10-CM

## 2021-08-23 DIAGNOSIS — I2510 Atherosclerotic heart disease of native coronary artery without angina pectoris: Secondary | ICD-10-CM

## 2021-08-23 DIAGNOSIS — E876 Hypokalemia: Secondary | ICD-10-CM

## 2021-08-23 DIAGNOSIS — F1920 Other psychoactive substance dependence, uncomplicated: Secondary | ICD-10-CM

## 2021-08-23 DIAGNOSIS — Z9114 Patient's other noncompliance with medication regimen: Secondary | ICD-10-CM

## 2021-08-23 DIAGNOSIS — N183 Chronic kidney disease, stage 3 unspecified: Secondary | ICD-10-CM

## 2021-08-23 DIAGNOSIS — I480 Paroxysmal atrial fibrillation: Secondary | ICD-10-CM | POA: Diagnosis not present

## 2021-08-23 DIAGNOSIS — I739 Peripheral vascular disease, unspecified: Secondary | ICD-10-CM

## 2021-08-23 DIAGNOSIS — G4733 Obstructive sleep apnea (adult) (pediatric): Secondary | ICD-10-CM

## 2021-08-23 LAB — CBC
HCT: 49.2 % (ref 39.0–52.0)
Hemoglobin: 15.6 g/dL (ref 13.0–17.0)
MCH: 30.6 pg (ref 26.0–34.0)
MCHC: 31.7 g/dL (ref 30.0–36.0)
MCV: 96.7 fL (ref 80.0–100.0)
Platelets: 257 10*3/uL (ref 150–400)
RBC: 5.09 MIL/uL (ref 4.22–5.81)
RDW: 15.4 % (ref 11.5–15.5)
WBC: 15.3 10*3/uL — ABNORMAL HIGH (ref 4.0–10.5)
nRBC: 0 % (ref 0.0–0.2)

## 2021-08-23 LAB — DIFFERENTIAL
Abs Immature Granulocytes: 0.08 10*3/uL — ABNORMAL HIGH (ref 0.00–0.07)
Basophils Absolute: 0.1 10*3/uL (ref 0.0–0.1)
Basophils Relative: 1 %
Eosinophils Absolute: 0.1 10*3/uL (ref 0.0–0.5)
Eosinophils Relative: 0 %
Immature Granulocytes: 1 %
Lymphocytes Relative: 10 %
Lymphs Abs: 1.6 10*3/uL (ref 0.7–4.0)
Monocytes Absolute: 1.4 10*3/uL — ABNORMAL HIGH (ref 0.1–1.0)
Monocytes Relative: 9 %
Neutro Abs: 12.1 10*3/uL — ABNORMAL HIGH (ref 1.7–7.7)
Neutrophils Relative %: 79 %

## 2021-08-23 LAB — COMPREHENSIVE METABOLIC PANEL
ALT: 32 U/L (ref 0–44)
AST: 32 U/L (ref 15–41)
Albumin: 3.1 g/dL — ABNORMAL LOW (ref 3.5–5.0)
Alkaline Phosphatase: 173 U/L — ABNORMAL HIGH (ref 38–126)
Anion gap: 15 (ref 5–15)
BUN: 40 mg/dL — ABNORMAL HIGH (ref 8–23)
CO2: 21 mmol/L — ABNORMAL LOW (ref 22–32)
Calcium: 8.6 mg/dL — ABNORMAL LOW (ref 8.9–10.3)
Chloride: 102 mmol/L (ref 98–111)
Creatinine, Ser: 2.18 mg/dL — ABNORMAL HIGH (ref 0.61–1.24)
GFR, Estimated: 31 mL/min — ABNORMAL LOW (ref >60)
Glucose, Bld: 111 mg/dL — ABNORMAL HIGH (ref 70–99)
Potassium: 4.7 mmol/L (ref 3.5–5.1)
Sodium: 138 mmol/L (ref 135–145)
Total Bilirubin: 0.7 mg/dL (ref 0.3–1.2)
Total Protein: 7.2 g/dL (ref 6.5–8.1)

## 2021-08-23 LAB — LIPID PANEL
Cholesterol: 71 mg/dL (ref 0–200)
HDL: 27 mg/dL — ABNORMAL LOW (ref >40)
LDL Cholesterol: 31 mg/dL (ref 0–99)
Total CHOL/HDL Ratio: 2.6 RATIO
Triglycerides: 66 mg/dL (ref <150)
VLDL: 13 mg/dL (ref 0–40)

## 2021-08-23 LAB — GLUCOSE, CAPILLARY: Glucose-Capillary: 141 mg/dL — ABNORMAL HIGH (ref 70–99)

## 2021-08-23 LAB — MAGNESIUM: Magnesium: 2.1 mg/dL (ref 1.7–2.4)

## 2021-08-23 MED ORDER — FUROSEMIDE 10 MG/ML IJ SOLN: 80.0000 mg | Freq: Two times a day (BID) | INTRAMUSCULAR | Status: DC

## 2021-08-23 MED ORDER — SODIUM CHLORIDE 0.9 % IV SOLN: INTRAVENOUS | Status: DC | PRN

## 2021-08-23 MED ORDER — UMECLIDINIUM BROMIDE 62.5 MCG/ACT IN AEPB
1.0000 | INHALATION_SPRAY | Freq: Every day | RESPIRATORY_TRACT | Status: DC
  Administered 2021-08-24 – 2021-08-31 (×8): 1 via RESPIRATORY_TRACT
  Filled 2021-08-23 (×2): qty 7

## 2021-08-23 MED ORDER — FUROSEMIDE 10 MG/ML IJ SOLN
120.0000 mg | Freq: Two times a day (BID) | INTRAVENOUS | Status: DC
  Administered 2021-08-23 – 2021-08-27 (×8): 120 mg via INTRAVENOUS
  Filled 2021-08-23: qty 10
  Filled 2021-08-23: qty 12
  Filled 2021-08-23 (×3): qty 10
  Filled 2021-08-23 (×4): qty 12

## 2021-08-23 MED ORDER — FUROSEMIDE 10 MG/ML IJ SOLN
120.0000 mg | Freq: Once | INTRAVENOUS | Status: AC
  Administered 2021-08-23: 120 mg via INTRAVENOUS
  Filled 2021-08-23: qty 10

## 2021-08-23 NOTE — Progress Notes (Signed)
RT note. Patient taken off bipap at this time for breakfast. Patient placed on 5L [place] sat 98%. Told patient to let RN know when done with breakfast to be placed back on bipap. RT will continue to monitor.

## 2021-08-23 NOTE — Progress Notes (Signed)
Patient noted to have increase work of breathing. On assessment patient complained of shortness of breath, Rt also at bedside at this time. RT requesting transition from nasal cannula CPAP to BiPAP given patient is a mouth breather. MD made aware.

## 2021-08-23 NOTE — Progress Notes (Addendum)
° °  Subjective: NAOE  Patient was seen at bedside during rounds today. Pt reports feeling well. He has noticed improvement in [facility]. He denies chest pain and abdominal pain. No questions or concerns. Pt is updated on the plan for today.  Objective:  Vital signs in last 24 hours: Vitals:   08/23/21 0417 08/23/21 0723 08/23/21 0806 08/23/21 1129  BP: 109/79  113/78 113/77  Pulse: (!) 108  (!) 123 (!) 110  Resp: 20  20 20   Temp:   98.9 F (37.2 C) 97.9 F (36.6 C)  TempSrc:   Oral Oral  SpO2: 100% 98% 97% 99%  Weight: 99.7 kg     Height:       Constitutional: alert, well-appearing, in NAD HENT: normocephalic, atraumatic, mucous membranes moist Cardiovascular: IRIR, no m/r/g, 2+ bilateral LE Pulmonary/Chest: normal work of breathing with supplemental O2, crackles in lung bases  Abdominal: soft, non-tender to palpation, non-distended MSK: normal bulk and tone Neurological: A&O x 3 Skin: warm and dry  Assessment/Plan:  Principal Problem:   Acute on chronic heart failure (HCC) Active Problems:   Atrial fibrillation (HCC)   Tobacco abuse   Polycythemia vera (HCC)   COPD (chronic obstructive pulmonary disease) (HCC)   Opioid use disorder  [name] is a 76 y.o. male with multiple pertinent PMH admitted for acute on chronic HRrEF exacerbation and Afib with RVR.    #Acute HFrEF exacerbation   #Hx of combined HF (EF 40-45% 09/2020) #Hx of medication nonadherence Hypervolemic on exam with labored breathing. Trial off O2 at bedside with maintenance of sats 90-96%, resumed 2L. Diuresing slowly, weight down 4.3 kg in 3 days. Net negative 85cc yesterday but 1500 cc out. 2D echo pending, may need cath if EF reduced.  -Cardiology following, appreciate assistance   -IV Lasix 120 bid  -Add 2.5mg  metolazone if not reaching goal of net negative 2L  -GDMT limited by BP -Place on bipap if worsening respiratory status  -Strict ins and outs and daily weights -Trend BMP    #Paroxysmal Atrial Fibrillation/Flutter #Hx of A flutter in 2012 s/p ablation in 2012 #Medication nonadherence Rates steady in 110-120's.  -Cardiology following, appreciate assistance  -Switched to PO amio to reduce fluid intake (above) -Continue Eliquis 5 mg -Possible TEE/DCCV this admission    #CKD stage III a #Hypokalemia Cr stable near 2.1 past couple of days; baseline 1.54.   -Trend BMP -Replete electrolytes as needed   #Coronary Artery Disease s/p LAD DES in March 2022 #PAD -Continue Plavix 75 mg daily -Restarted Eliquis 5 mg daily -Continue Lipitor 40 mg daily -Follow up with cardiology as an outpatient    #Polysubstance abuse -Continue Suboxone 8-2 mg bid  -Monitor for signs of withdrawal   #COPD -Dulera daily -Albuterol as needed   #OSA -nightly CPAP   #Prediabetes A1c 6.0% this admission   Best Practice: Diet: Heart Healthy IVF: None,None VTE: Eliquis  Code: Full   [name], MD  Internal Medicine Resident, PGY-1 Pager: [phone] After 5pm on weekdays and 1pm on weekends: On Call pager [phone]

## 2021-08-23 NOTE — Progress Notes (Signed)
Progress Note  Patient Name: [name] Date of Encounter: 08/23/2021  [facility] Cardiologist: Belva Crome III, MD   Subjective   Sitting up , SOB at rest with Ferney. He states he feels better  Crt 1.5->2.18 (lasix IV 40 and 80) BP normal  Inpatient Medications    Scheduled Meds:  apixaban  5 mg Oral BID   atorvastatin  80 mg Oral Daily   buprenorphine-naloxone  1 tablet Sublingual BID   clopidogrel  75 mg Oral Daily   fluticasone furoate-vilanterol  1 puff Inhalation Daily   umeclidinium bromide  1 puff Inhalation Daily   Continuous Infusions:  sodium chloride Stopped (08/23/21 0149)   amiodarone 30 mg/hr (08/23/21 0636)   PRN Meds: sodium chloride, acetaminophen **OR** acetaminophen, albuterol, senna-docusate   Vital Signs    Vitals:   08/23/21 0307 08/23/21 0417 08/23/21 0723 08/23/21 0806  BP:  109/79  113/78  Pulse: (!) 118 (!) 108  (!) 123  Resp: 20 20  20   Temp:    98.9 F (37.2 C)  TempSrc:    Oral  SpO2: 100% 100% 98% 97%  Weight:  99.7 kg    Height:        Intake/Output Summary (Last 24 hours) at 08/23/2021 1048 Last data filed at 08/23/2021 0951 Gross per 24 hour  Intake 811.67 ml  Output 800 ml  Net 11.67 ml   Last 3 Weights 08/23/2021 08/22/2021 04/26/2021  Weight (lbs) 219 lb 12.8 oz 231 lb 7.7 oz 212 lb 4.9 oz  Weight (kg) 99.7 kg 105 kg 96.3 kg      Telemetry    Afib rates < 120s - Personally Reviewed  ECG    No new - Personally Reviewed  Physical Exam   Vitals:   08/23/21 0723 08/23/21 0806  BP:  113/78  Pulse:  (!) 123  Resp:  20  Temp:  98.9 F (37.2 C)  SpO2: 98% 97%     GEN: Sitting up , SOB Cardiac: irregular rate and rhythm, no murmurs, rubs, or gallops.  Respiratory: mild increased wob, decreased BS BL GI: non distended MS: No edema; No deformity. Neuro:  Nonfocal  Psych: Normal affect   Labs    High Sensitivity Troponin:   Recent Labs  Lab 08/22/21 0241 08/22/21 0455  TROPONINIHS 30* 31*      Chemistry Recent Labs  Lab 08/22/21 0241 08/23/21 0315  NA 142 138  K 3.4* 4.7  CL 106 102  CO2 23 21*  GLUCOSE 119* 111*  BUN 28* 40*  CREATININE 1.54* 2.18*  CALCIUM 8.5* 8.6*  MG  --  2.1  PROT 7.2 7.2  ALBUMIN 3.1* 3.1*  AST 29 32  ALT 23 32  ALKPHOS 152* 173*  BILITOT 0.7 0.7  GFRNONAA 47* 31*  ANIONGAP 13 15    Lipids  Recent Labs  Lab 08/23/21 0315  CHOL 71  TRIG 66  HDL 27*  LDLCALC 31  CHOLHDL 2.6    Hematology Recent Labs  Lab 08/22/21 0241 08/23/21 0315  WBC 11.7* 15.3*  RBC 4.86 5.09  HGB 15.3 15.6  HCT 47.3 49.2  MCV 97.3 96.7  MCH 31.5 30.6  MCHC 32.3 31.7  RDW 15.3 15.4  PLT 251 257   Thyroid  Recent Labs  Lab 08/22/21 1516  TSH 1.462    BNP Recent Labs  Lab 08/22/21 0241  BNP 953.7*    DDimer No results for input(s): DDIMER in the last 168 hours.  Radiology    DG CHEST PORT 1 VIEW  Result Date: 08/23/2021 CLINICAL DATA:  Dyspnea. EXAM: PORTABLE CHEST 1 VIEW COMPARISON:  08/22/2021. FINDINGS: The heart is enlarged and the mediastinal contour is stable. Atherosclerotic calcification of the aorta is noted. The pulmonary vasculature is distended. Perihilar interstitial and airspace opacities are noted bilaterally. No effusion or pneumothorax. A loop recorder device is present over the left chest. No acute osseous abnormality. IMPRESSION: 1. Cardiomegaly with pulmonary vascular congestion. 2. Perihilar interstitial and airspace opacities, possible edema or infiltrate. Electronically Signed   By: Brett Fairy M.D.   On: 08/23/2021 00:59   DG Chest Port 1 View  Result Date: 08/22/2021 CLINICAL DATA:  Shortness of breath EXAM: PORTABLE CHEST 1 VIEW COMPARISON:  04/26/2021 FINDINGS: Cardiac shadow is enlarged but stable. Aortic calcifications are noted. Loop recorder is again seen. Lungs are clear. No acute bony abnormality is noted. IMPRESSION: Stable cardiomegaly. No other focal abnormality is noted. Electronically Signed   By: Inez Catalina M.D.   On: 08/22/2021 03:28    Cardiac Studies   Pending FU echo  Patient Profile     [name] is a 76 y.o. male with a history of CAD s/p DES to LAD in 09/2020, nonischemic cardiomyopathy/chronic systolic CHF with EF as low as 35% in 2012 in the setting of polysubstance abuse with normalization of EF but back down to 40-45% on last Echo in 09/2020, paroxysmal atrial fibrillation/flutter s/p ablation in 2012 on Amiodarone and Eliquis, ascending aortic aneurysm, hypertension, CKD stage III, COPD, obstructive sleep apnea not compliant with CPAP, GERD, polycythemia vera, polysubstance abuse (tobacco, alcohol, heroin, cocaine) on Suboxone, and medication non-compliance who was admitted with acute on chronic CHF and atrial fibrillation with RVR on 08/22/2021 after presenting with shortness of breath. Cardiology consulted for assistance at the request of Dr. Dareen Piano.   Assessment & Plan    Acute on Chronic Systolic CHF Non-Ischemic Cardiomyopathy Initially diagnosed in 2012.  EF 35% at that time.  Felt to be secondary to polysubstance abuse and EF ultimately normalized.  However, LVEF back down to 40-45% on last echo in 09/2020.  He now presents with worsening shortness of breath and lower extremity edema in the setting of medication non-compliance. He has not refilled his Lasix since October. BNP elevated at 953.   - Hypervolemic, warm and wet - net negative 160. S/p 120 mg IV lasix. Can plan for 120 mg IV BID with minimal progress. Goal net negative 2 L ( if not reaching this goal can add 2.5 mg metolazone); if worsening respiratory status may need bipap - Echo pending. Suspect EF is going to be worse than it was in 09/2020. -Suspect he will need Torsemide at discharge given significant gut edema. - BP soft at times so will hold off on adding any GDMT for now to allow for more diuresis. - Continue to monitor daily weights, strict I/Os, and renal function  Paroxysmal Atrial  Fibrillation/Flutter History of paroxysmal atrial fibrillation and flutter. S/p flutter ablation in 2012.  Presented in rapid atrial fibrillation/flutter with with rates in the 140s. Potassium 3.4. Magnesium pending. Patient had not been taking home Amiodarone or Eliquis. - Rates currently in the 110s to 120s. - TSH nl - continue IV amiodarone - CHA2DS-VASc = 4 (CHF, CAD, HTN, age). Home Eliquis 5mg  twice daily restarted. - Suspect he will ultimately need TEE/DCCV this admission.   CAD S/p DES to LAD in 09/2020. High-sensitivity troponin minimally elevated and flat in  the 30s consistent with demand ischemia from acute CHF and rapid atrial fibrillation. - No angina. - Continue Plavix monotherapy. Not on Aspirin due to DOAC. - Continue high-intensity statin.   Hypertension BP soft at times but stable. - Continue medications for CHF as above.   Hyperlipidemia LDL 83 in 3.2022.  - Continue home Lipitor 80mg  daily. - Repeat fasting lipid panel in the morning.   CKD Stage III Creatinine 1.54 on admission. Baseline between 1.4 to 1.6. - Continue to monitor.   Hypokalemia Potassium 3.4 on admission. Repleted by primary team. - Continue to monitor.    Otherwise, per primary team: - COPD - Obstructive sleep apnea on CPAP - Prediabetes  - Polysubstance abuse      For questions or updates, please contact [place] Please consult [email or web address] for contact info under        Signed, Janina Mayo, MD  08/23/2021, 10:48 AM

## 2021-08-23 NOTE — Progress Notes (Signed)
Post 120 mg of iv lasix no urinary output noted. Patient bladder scanned 168mls in bladder, asked about urge to urinate pat stated he has no urge.MD made aware.

## 2021-08-23 NOTE — Progress Notes (Signed)
Occupational Therapy Treatment Patient Details Name: [name] MRN: [id number] DOB: 1946/04/27 Today's Date: 08/23/2021   History of present illness 76 yo admitted 2/3 with SOB and CHF. PMhx: CHF, COPD, AFib, NSTEMI, NICM, dermatitis, PAD, HLD, tobacco and ETOH use   OT comments  Patient received sitting in recliner and agreeable to OT session.  Patient ambulated short distance to sink with hand held assist to perform grooming tasks and required seated rest break following. Patient performed 3 standing to address standing tolerance with reaching tasks and patient tolerating 30-60 seconds and increased time to rest following. Patient given handout of energy conservation techniques and reviewed with patient. Acute OT to continue to follow.    Recommendations for follow up therapy are one component of a multi-disciplinary discharge planning process, led by the attending physician.  Recommendations may be updated based on patient status, additional functional criteria and insurance authorization.    Follow Up Recommendations  Home health OT    Assistance Recommended at Discharge Frequent or constant Supervision/Assistance  Patient can return home with the following  A little help with walking and/or transfers;A little help with bathing/dressing/bathroom   Equipment Recommendations  Tub/shower bench    Recommendations for Other Services      Precautions / Restrictions Precautions Precautions: Fall Precaution Comments: watch HR, on 4L O2 for mobility Restrictions Weight Bearing Restrictions: No       Mobility Bed Mobility Overal bed mobility: Needs Assistance             General bed mobility comments: sitting in recliner upon arrival    Transfers Overall transfer level: Needs assistance Equipment used: None Transfers: Sit to/from Stand Sit to Stand: Min guard           General transfer comment: ambulated short distance to sink with hand held assist and  returned to recliner     Balance Overall balance assessment: Needs assistance Sitting-balance support: No upper extremity supported, Feet supported Sitting balance-Leahy Scale: Good Sitting balance - Comments: sitting in recliner upon arrival   Standing balance support: Single extremity supported Standing balance-Leahy Scale: Fair Standing balance comment: stood at sink for grooming with one extremity support with sink                           ADL either performed or assessed with clinical judgement   ADL Overall ADL's : Needs assistance/impaired     Grooming: Wash/dry hands;Wash/dry face;Oral care;Standing;Min guard Grooming Details (indicate cue type and reason): performed standing at sink with min guard but requiring a seated break following                               General ADL Comments: hand held assist to ambulate short distance from recliner to sink    Extremity/Trunk Assessment              Vision       Perception     Praxis      Cognition Arousal/Alertness: Awake/alert Behavior During Therapy: Flat affect, WFL for tasks assessed/performed Overall Cognitive Status: Impaired/Different from baseline Area of Impairment: Memory                     Memory: Decreased short-term memory         General Comments: alert and oriented        Exercises Exercises: Other exercises Other Exercises Other  Exercises: standing tolerance activities with reaching tasks and patient tolerating 30 seconds to 1 minute    Shoulder Instructions       General Comments SpO2 99-100%, HR increased to 120 while standing before resting    Pertinent Vitals/ Pain       Pain Assessment Pain Assessment: No/denies pain  Home Living                                          Prior Functioning/Environment              Frequency  Min 2X/week        Progress Toward Goals  OT Goals(current goals can now be found  in the care plan section)  Progress towards OT goals: Progressing toward goals  Acute Rehab OT Goals Patient Stated Goal: get stronger OT Goal Formulation: With patient Time For Goal Achievement: 09/05/21 Potential to Achieve Goals: Good ADL Goals Pt Will Perform Grooming: with supervision;standing Pt Will Perform Lower Body Dressing: with supervision;sit to/from stand Pt Will Transfer to Toilet: with supervision;ambulating Additional ADL Goal #1: Pt will demonstrate independence with 3 energy conservation strategies during ADL/functional mobility.  Plan Discharge plan remains appropriate    Co-evaluation                 AM-PAC OT "6 Clicks" Daily Activity     Outcome Measure   Help from another person eating meals?: None Help from another person taking care of personal grooming?: A Little Help from another person toileting, which includes using toliet, bedpan, or urinal?: A Little Help from another person bathing (including washing, rinsing, drying)?: A Little Help from another person to put on and taking off regular upper body clothing?: None Help from another person to put on and taking off regular lower body clothing?: A Little 6 Click Score: 20    End of Session Equipment Utilized During Treatment: Oxygen  OT Visit Diagnosis: Other abnormalities of gait and mobility (R26.89);Muscle weakness (generalized) (M62.81);Other symptoms and signs involving cognitive function   Activity Tolerance Patient tolerated treatment well   Patient Left in chair;with call bell/phone within reach   Nurse Communication Mobility status        Time: 0354-6568 OT Time Calculation (min): 24 min  Charges: OT General Charges $OT Visit: 1 Visit OT Treatments $Self Care/Home Management : 8-22 mins $Therapeutic Activity: 8-22 mins  Lodema Hong, [place]  Pager [phone] Office [phone]   Trixie Dredge 08/23/2021, 2:18 PM

## 2021-08-23 NOTE — Progress Notes (Addendum)
Bladder scan done per MD order, no retention noted : scan showed 75mls in bladder.However patient noted to be sweating profusely, tried to get axillary and oral temps both 94.5, cbg 141, and no complaint of pain reported. Unable to to get rectal temp as patient cant tolerated position.MD made aware.

## 2021-08-23 NOTE — Plan of Care (Signed)
  Problem: Education: Goal: Knowledge of General Education information will improve Description: Including pain rating scale, medication(s)/side effects and non-pharmacologic comfort measures Outcome: Progressing   Problem: Clinical Measurements: Goal: Ability to maintain clinical measurements within normal limits will improve Outcome: Progressing   Problem: Clinical Measurements: Goal: Respiratory complications will improve Outcome: Progressing   

## 2021-08-23 NOTE — Plan of Care (Signed)
  Problem: Coping: Goal: Level of anxiety will decrease Outcome: Progressing   Problem: Elimination: Goal: Will not experience complications related to urinary retention Outcome: Progressing   

## 2021-08-23 NOTE — Evaluation (Signed)
Physical Therapy Evaluation Patient Details Name: [name] MRN: [id number] DOB: 04-11-1946 Today's Date: 08/23/2021  History of Present Illness  76 yo admitted 2/3 with SOB and CHF. PMhx: CHF, COPD, AFib, NSTEMI, NICM, dermatitis, PAD, HLD, tobacco and ETOH use.   Clinical Impression  Pt in bed upon arrival of PT, agreeable to evaluation at this time. Prior to admission the pt was mobilizing short distances in the home with use of cane, driving in community, and reports general independence with mobility and ADLs. The pt now presents with limitations in functional mobility, endurance, and dynamic stabiltiy due to above dx, and will continue to benefit from skilled PT to address these deficits. The pt was able to demo good capacity for sit-stand transfers without need for UE support, but requires increased time and 4L O2 to complete short ambulation with SPC. The pt had no LOB, but was limited to <50 ft ambulation prior to needing seated rest break. SpO2 maintained 93-97% on 4L. Will continue to benefit from skilled PT to progress endurance and dynamic stability to improve independence, may progress to no follow up therapies needed.   5X Sit-to-Stand: 23.6 sec (> 12.6 sec indicates increased risk of falls for individuals aged 24-79, > 15 sec indicates increased risk of recurrent falls)  Gait Speed: 0.28m/s using SPC and with 4L O2. (Gait speed <0.67m/s indicates increased risk of falls and dependence in ADLs     Recommendations for follow up therapy are one component of a multi-disciplinary discharge planning process, led by the attending physician.  Recommendations may be updated based on patient status, additional functional criteria and insurance authorization.  Follow Up Recommendations Home health PT (vs no PT pending progression)    Assistance Recommended at Discharge Intermittent Supervision/Assistance  Patient can return home with the following  A little help with walking  and/or transfers;A little help with bathing/dressing/bathroom;Direct supervision/assist for medications management;Help with stairs or ramp for entrance    Equipment Recommendations None recommended by PT  Recommendations for Other Services       Functional Status Assessment Patient has had a recent decline in their functional status and demonstrates the ability to make significant improvements in function in a reasonable and predictable amount of time.     Precautions / Restrictions Precautions Precautions: Fall Precaution Comments: watch HR, on 4L O2 for mobility Restrictions Weight Bearing Restrictions: No      Mobility  Bed Mobility Overal bed mobility: Needs Assistance             General bed mobility comments: pt sitting EOB upon arrival    Transfers Overall transfer level: Needs assistance Equipment used: Straight cane Transfers: Sit to/from Stand Sit to Stand: Min guard           General transfer comment: minG for safety, no assist given    Ambulation/Gait Ambulation/Gait assistance: Min guard Gait Distance (Feet): 46 Feet (+ 8 ft) Assistive device: Straight cane Gait Pattern/deviations: Step-to pattern, Wide base of support, Shuffle Gait velocity: 0.36 m/s Gait velocity interpretation: <1.31 ft/sec, indicative of household ambulator   General Gait Details: pt with short, shuffling steps with mild lateral sway and wide BOS. 4L O2 used with SpO2 93-97% with mobility     Balance Overall balance assessment: Needs assistance Sitting-balance support: No upper extremity supported, Feet supported Sitting balance-Leahy Scale: Good Sitting balance - Comments: pt sitting EOB upon arrival,   Standing balance support: Single extremity supported Standing balance-Leahy Scale: Fair Standing balance comment: able to static stand  without UE but demonstrated preference for at least single UE support                             Pertinent Vitals/Pain  Pain Assessment Pain Assessment: No/denies pain    Home Living Family/patient expects to be discharged to:: Private residence Living Arrangements: Spouse/significant other;Other relatives Available Help at Discharge: Family;Available 24 hours/day Type of Home: House Home Access: Stairs to enter Entrance Stairs-Rails: Can reach both Entrance Stairs-Number of Steps: 3   Home Layout: One level Home Equipment: Cane - single Associate Professor (2 wheels) Additional Comments: pt reports use of cane at baseline;pt lives with his "better half" and her 2 grandchildren who are 18y.o and 16y.o    Prior Function Prior Level of Function : Independent/Modified Independent;Driving             Mobility Comments: pt reports using a cane at baseline       Hand Dominance   Dominant Hand: Right    Extremity/Trunk Assessment   Upper Extremity Assessment Upper Extremity Assessment: Defer to OT evaluation    Lower Extremity Assessment Lower Extremity Assessment: Overall WFL for tasks assessed (skin tight and swollen but good functional strength)    Cervical / Trunk Assessment Cervical / Trunk Assessment: Other exceptions Cervical / Trunk Exceptions: large body habitus  Communication   Communication: No difficulties  Cognition Arousal/Alertness: Awake/alert Behavior During Therapy: Flat affect, WFL for tasks assessed/performed Overall Cognitive Status: Impaired/Different from baseline Area of Impairment: Memory                     Memory: Decreased short-term memory         General Comments: pt oriented and following simple commands. Pt very agreeable and able to answer questions about home mobility. some differences between pt reports and info im chart (specifically pt states never been on O2, chart reports pt on 2L at baseline)        General Comments General comments (skin integrity, edema, etc.): Pt on 5L O2 upon arrival tried 4L O2 with mobility and SpO2  93-97%.    Exercises Other Exercises Other Exercises: 5x sit-stand in 23.6 sec   Assessment/Plan    PT Assessment Patient needs continued PT services  PT Problem List Decreased activity tolerance;Decreased balance;Cardiopulmonary status limiting activity       PT Treatment Interventions DME instruction;Gait training;Stair training;Functional mobility training;Therapeutic activities;Therapeutic exercise;Balance training;Patient/family education    PT Goals (Current goals can be found in the Care Plan section)  Acute Rehab PT Goals Patient Stated Goal: return home PT Goal Formulation: With patient Time For Goal Achievement: 09/06/21 Potential to Achieve Goals: Good    Frequency Min 3X/week        AM-PAC PT "6 Clicks" Mobility  Outcome Measure Help needed turning from your back to your side while in a flat bed without using bedrails?: A Little Help needed moving from lying on your back to sitting on the side of a flat bed without using bedrails?: A Little Help needed moving to and from a bed to a chair (including a wheelchair)?: A Little Help needed standing up from a chair using your arms (e.g., wheelchair or bedside chair)?: A Little Help needed to walk in hospital room?: A Little Help needed climbing 3-5 steps with a railing? : A Little 6 Click Score: 18    End of Session Equipment Utilized During Treatment: Gait belt;Oxygen Activity Tolerance: Patient tolerated  treatment well Patient left: in chair;with call bell/phone within reach;with nursing/sitter in room Nurse Communication: Mobility status PT Visit Diagnosis: Unsteadiness on feet (R26.81);Other abnormalities of gait and mobility (R26.89);Muscle weakness (generalized) (M62.81)    Time: 5520-8022 PT Time Calculation (min) (ACUTE ONLY): 22 min   Charges:   PT Evaluation $PT Eval Low Complexity: 1 Low          West Carbo, PT, DPT   Acute Rehabilitation Department Pager #: [phone]  Sandra Cockayne 08/23/2021, 10:26 AM

## 2021-08-24 ENCOUNTER — Inpatient Hospital Stay (HOSPITAL_COMMUNITY): Payer: Medicare Other

## 2021-08-24 ENCOUNTER — Other Ambulatory Visit (HOSPITAL_COMMUNITY): Payer: Medicare Other

## 2021-08-24 DIAGNOSIS — I2510 Atherosclerotic heart disease of native coronary artery without angina pectoris: Secondary | ICD-10-CM | POA: Diagnosis not present

## 2021-08-24 DIAGNOSIS — I5043 Acute on chronic combined systolic (congestive) and diastolic (congestive) heart failure: Secondary | ICD-10-CM | POA: Diagnosis not present

## 2021-08-24 DIAGNOSIS — Z9114 Patient's other noncompliance with medication regimen: Secondary | ICD-10-CM | POA: Diagnosis not present

## 2021-08-24 DIAGNOSIS — I5023 Acute on chronic systolic (congestive) heart failure: Secondary | ICD-10-CM | POA: Diagnosis not present

## 2021-08-24 DIAGNOSIS — I5021 Acute systolic (congestive) heart failure: Secondary | ICD-10-CM

## 2021-08-24 DIAGNOSIS — I480 Paroxysmal atrial fibrillation: Secondary | ICD-10-CM | POA: Diagnosis not present

## 2021-08-24 DIAGNOSIS — I428 Other cardiomyopathies: Secondary | ICD-10-CM | POA: Diagnosis not present

## 2021-08-24 DIAGNOSIS — N1831 Chronic kidney disease, stage 3a: Secondary | ICD-10-CM | POA: Diagnosis not present

## 2021-08-24 LAB — MAGNESIUM: Magnesium: 1.8 mg/dL (ref 1.7–2.4)

## 2021-08-24 LAB — BASIC METABOLIC PANEL
Anion gap: 14 (ref 5–15)
BUN: 45 mg/dL — ABNORMAL HIGH (ref 8–23)
CO2: 22 mmol/L (ref 22–32)
Calcium: 8.3 mg/dL — ABNORMAL LOW (ref 8.9–10.3)
Chloride: 99 mmol/L (ref 98–111)
Creatinine, Ser: 2.07 mg/dL — ABNORMAL HIGH (ref 0.61–1.24)
GFR, Estimated: 33 mL/min — ABNORMAL LOW (ref >60)
Glucose, Bld: 135 mg/dL — ABNORMAL HIGH (ref 70–99)
Potassium: 4.2 mmol/L (ref 3.5–5.1)
Sodium: 135 mmol/L (ref 135–145)

## 2021-08-24 LAB — CBC
HCT: 45.8 % (ref 39.0–52.0)
Hemoglobin: 14.6 g/dL (ref 13.0–17.0)
MCH: 30.5 pg (ref 26.0–34.0)
MCHC: 31.9 g/dL (ref 30.0–36.0)
MCV: 95.8 fL (ref 80.0–100.0)
Platelets: 236 10*3/uL (ref 150–400)
RBC: 4.78 MIL/uL (ref 4.22–5.81)
RDW: 15.4 % (ref 11.5–15.5)
WBC: 14 10*3/uL — ABNORMAL HIGH (ref 4.0–10.5)
nRBC: 0 % (ref 0.0–0.2)

## 2021-08-24 MED ORDER — AMIODARONE HCL 200 MG PO TABS
400.0000 mg | ORAL_TABLET | Freq: Two times a day (BID) | ORAL | Status: DC
  Administered 2021-08-24 – 2021-08-31 (×15): 400 mg via ORAL
  Filled 2021-08-24 (×15): qty 2

## 2021-08-24 NOTE — Progress Notes (Signed)
°  Echocardiogram 2D Echocardiogram has been performed.  [name] 08/24/2021, 5:41 PM

## 2021-08-24 NOTE — Plan of Care (Signed)
Problem: Pain Managment: Goal: General experience of comfort will improve Outcome: Completed/Met   Problem: Coping: Goal: Level of anxiety will decrease Outcome: Completed/Met   Problem: Nutrition: Goal: Adequate nutrition will be maintained Outcome: Completed/Met   Problem: Clinical Measurements: Goal: Will remain free from infection Outcome: Completed/Met

## 2021-08-24 NOTE — Progress Notes (Signed)
Pharmacist Heart Failure Core Measure Documentation  Assessment: Leanord Thibeau has an EF documented as 40%.  Rationale: Heart failure patients with left ventricular systolic dysfunction (LVSD) and an EF < 40% should be prescribed an angiotensin converting enzyme inhibitor (ACEI) or angiotensin receptor blocker (ARB) at discharge unless a contraindication is documented in the medical record.  This patient is not currently on an ACEI or ARB for HF.  This note is being placed in the record in order to provide documentation that a contraindication to the use of these agents is present for this encounter.  ACE Inhibitor or Angiotensin Receptor Blocker is contraindicated (specify all that apply)  []   ACEI allergy AND ARB allergy []   Angioedema []   Moderate or severe aortic stenosis []   Hyperkalemia []   Hypotension []   Renal artery stenosis [x]   Worsening renal function, preexisting renal disease or dysfunction   Ursula Beath 08/24/2021 1:24 PM

## 2021-08-24 NOTE — TOC Initial Note (Addendum)
Transition of Care [facility]) - Initial/Assessment Note    Patient Details  Name: [name] MRN: [id number] Date of Birth: [birth year]  Transition of Care [facility]) CM/SW Contact:    Zenon Mayo, RN Phone Number: 08/24/2021, 10:27 AM  Clinical Narrative:                 NCM spoke with patient at bedside, he lives with wife, he has a rolling walker  and cane that he uses.  Has PCP, he has no issues with his medications, he uses a pillbox, he has bp cuff and scale which he uses daily, he eats low sodium diet.  He states he drives to his MD apts.  NCM offered choice for [facility] for disease management and HHPT from [email or web address].  He states he does not have a preference and does not have preference for DME agency. NCM made referral to [facility] with [facility].  He is able to take referral.  Soc will begin 24 to 48 hrs post dc.   Expected Discharge Plan: [place] Barriers to Discharge: Continued Medical Work up   Patient Goals and CMS Choice Patient states their goals for this hospitalization and ongoing recovery are:: return home with wife CMS Medicare.gov Compare Post Acute Care list provided to:: Patient Choice offered to / list presented to : Patient  Expected Discharge Plan and Services Expected Discharge Plan: [place]   Discharge Planning Services: CM Consult Post Acute Care Choice: [place] arrangements for the past 2 months: Single Family Home                   DME Agency: NA       HH Arranged: RN, Disease Management, PT [place] Agency: [place] Date [facility] Agency Contacted: 08/24/21 Time HH Agency Contacted:  41 Representative at [place] spoke to: [employer]  Prior Living Arrangements/Services Living arrangements for the past 2 months: [employer] Lives with:: Spouse Patient language and need for interpreter reviewed:: Yes Do you feel safe going back to the place where you live?: Yes      Need for Family  Participation in Patient Care: Yes (Comment) Care giver support system in place?: Yes (comment) Current home services: DME (has walker and a cane) Criminal Activity/Legal Involvement Pertinent to Current Situation/Hospitalization: No - Comment as needed  Activities of Daily Living Home Assistive Devices/Equipment: Cane (specify quad or straight), Walker (specify type) ADL Screening (condition at time of admission) Patient's cognitive ability adequate to safely complete daily activities?: Yes Is the patient deaf or have difficulty hearing?: No Does the patient have difficulty seeing, even when wearing glasses/contacts?: No Does the patient have difficulty concentrating, remembering, or making decisions?: No Patient able to express need for assistance with ADLs?: Yes Does the patient have difficulty dressing or bathing?: No Independently performs ADLs?: Yes (appropriate for developmental age) Does the patient have difficulty walking or climbing stairs?: Yes Weakness of Legs: Both Weakness of Arms/Hands: Both  Permission Sought/Granted                  Emotional Assessment Appearance:: Appears stated age Attitude/Demeanor/Rapport: Engaged Affect (typically observed): Appropriate Orientation: : Oriented to Self, Oriented to Place, Oriented to  Time, Oriented to Situation Alcohol / Substance Use: Not Applicable Psych Involvement: No (comment)  Admission diagnosis:  Acute on chronic combined systolic and diastolic heart failure (HCC) [I50.43] Hypokalemia [E87.6] Renal insufficiency [N28.9] Persistent atrial fibrillation (HCC) [I48.19] Elevated troponin [  R77.8] Acute on chronic heart failure ([place]) [I50.9] Patient Active Problem List   Diagnosis Date Noted   Acute on chronic heart failure ([place]) 08/22/2021   Opioid use disorder 08/22/2021   Cellulitis 04/26/2021   Venous stasis dermatitis of both lower extremities 04/26/2021   PAD (peripheral artery disease) ([place]) 04/26/2021    Aortic atherosclerosis ([place]) 04/26/2021   Open leg wound, unspecified laterality, initial encounter    HLD (hyperlipidemia) 11/27/2020   Prediabetes 10/13/2020   Non-ST elevation (NSTEMI) myocardial infarction [facility])    Hx of adenomatous colonic polyps    Benign neoplasm of cecum    Benign neoplasm of ascending colon    Benign neoplasm of transverse colon    Benign neoplasm of descending colon    Benign neoplasm of sigmoid colon    Acute on chronic systolic CHF (congestive heart failure) ([place]) 04/04/2019   Long term current use of amiodarone 01/24/2018   GI bleed 02/15/2017   Pancytopenia, acquired ([place]) 11/26/2016   Tubular adenoma 11/25/2016   Gout 11/02/2016   Encounter for central line placement 05/25/2016   Hepatitis C antibody test positive    Hematuria 11/09/2014   Acute kidney injury ([place]) 11/01/2014   Leukopenia due to antineoplastic chemotherapy ([place]) 08/24/2014   Thrombocytopenia due to drugs 07/25/2014   Encounter for screening colonoscopy 07/02/2014   Chronic anticoagulation 07/02/2014   Acute pulmonary edema ([place]) 06/05/2014   Prolonged QT interval 06/05/2014   Sinus bradycardia 06/05/2014   Opioid dependence ([place]) 03/54/6568   Diastolic dysfunction 12/75/1700   COPD (chronic obstructive pulmonary disease) ([place]) 05/30/2014   Health care maintenance 05/24/2014   Acute respiratory failure with hypoxia and hypercapnia ([place]) 04/29/2014   Anemia due to antineoplastic chemotherapy 04/24/2014   Thyroid mass 08/26/2013   Myeloproliferative neoplasm ([place]) 08/15/2013   Polycythemia vera ([place]) 01/03/2013   Malnutrition of moderate degree ([place]) 12/15/2012   Tobacco abuse 09/15/2012   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[place])    Alcoholism ([place]) 09/30/2010   Hypertension 09/30/2010   Atrial fibrillation ([place])    PCP:  Marianna Payment, MD Pharmacy:   [facility] [place], [place] - [phone] [place] [address] Phone: [phone] Fax: [phone]  [facility] [address] [place] [address] Phone: [phone] Fax: [phone]     Social Determinants of Health (SDOH) Interventions    Readmission Risk Interventions Readmission Risk Prevention Plan 08/24/2021 05/17/2019 02/13/2019  Transportation Screening Complete Complete Complete  PCP or Specialist Appt within 3-5 Days Complete - -  HRI or Home Care Consult Complete - -  Social Work Consult for [place] Planning/Counseling Complete - -  Palliative Care Screening Not Applicable - -  Medication Review Press photographer) Complete Complete Complete  PCP or Specialist appointment within 3-5 days of discharge - Patient refused Complete  HRI or [place] - Patient refused Complete  SW Recovery Care/Counseling Consult - - Complete  Palliative Care Screening - - Not [place] - Not Applicable Not Applicable  Some recent data might be hidden

## 2021-08-24 NOTE — Progress Notes (Signed)
Progress Note  Patient Name: [name] Date of Encounter: 08/24/2021  [facility] Cardiologist: Sinclair Grooms, MD   Subjective    He states he feels better, less work of breathing  Crt 1.5->2.18->2.07 stable (lasix IV 40 and 80), now lasix 120 mg IV BID BP normal  Inpatient Medications    Scheduled Meds:  apixaban  5 mg Oral BID   atorvastatin  80 mg Oral Daily   buprenorphine-naloxone  1 tablet Sublingual BID   clopidogrel  75 mg Oral Daily   fluticasone furoate-vilanterol  1 puff Inhalation Daily   umeclidinium bromide  1 puff Inhalation Daily   Continuous Infusions:  sodium chloride Stopped (08/23/21 0149)   amiodarone 30 mg/hr (08/24/21 0641)   furosemide 120 mg (08/23/21 1700)   PRN Meds: sodium chloride, acetaminophen **OR** acetaminophen, albuterol, senna-docusate   Vital Signs    Vitals:   08/23/21 1913 08/23/21 2152 08/24/21 0046 08/24/21 0442  BP: 107/66  118/82 117/78  Pulse: 97  (!) 103 (!) 104  Resp: 20  18 19   Temp: 97.9 F (36.6 C)  98.5 F (36.9 C) 98 F (36.7 C)  TempSrc: Oral  Oral Oral  SpO2: 97% 98% 92% 98%  Weight:    100.7 kg  Height:        Intake/Output Summary (Last 24 hours) at 08/24/2021 0855 Last data filed at 08/24/2021 0819 Gross per 24 hour  Intake 1297.91 ml  Output 1204 ml  Net 93.91 ml   Last 3 Weights 08/24/2021 08/23/2021 08/22/2021  Weight (lbs) 222 lb 0.1 oz 219 lb 12.8 oz 231 lb 7.7 oz  Weight (kg) 100.7 kg 99.7 kg 105 kg      Telemetry    Afib rate controlled- Personally Reviewed  ECG    No new - Personally Reviewed  Physical Exam   Vitals:   08/24/21 0046 08/24/21 0442  BP: 118/82 117/78  Pulse: (!) 103 (!) 104  Resp: 18 19  Temp: 98.5 F (36.9 C) 98 F (36.7 C)  SpO2: 92% 98%     GEN: Sitting up , less distress Cardiac: + JVD irregular rate and rhythm, no murmurs, rubs, or gallops.  Respiratory: mild increased wob, decreased BS BL GI: non distended MS: No edema; No deformity.  2+ pitting edema BL Neuro:  Nonfocal  Psych: Normal affect   Labs    High Sensitivity Troponin:   Recent Labs  Lab 08/22/21 0241 08/22/21 0455  TROPONINIHS 30* 31*     Chemistry Recent Labs  Lab 08/22/21 0241 08/23/21 0315 08/24/21 0319  NA 142 138 135  K 3.4* 4.7 4.2  CL 106 102 99  CO2 23 21* 22  GLUCOSE 119* 111* 135*  BUN 28* 40* 45*  CREATININE 1.54* 2.18* 2.07*  CALCIUM 8.5* 8.6* 8.3*  MG  --  2.1 1.8  PROT 7.2 7.2  --   ALBUMIN 3.1* 3.1*  --   AST 29 32  --   ALT 23 32  --   ALKPHOS 152* 173*  --   BILITOT 0.7 0.7  --   GFRNONAA 47* 31* 33*  ANIONGAP 13 15 14     Lipids  Recent Labs  Lab 08/23/21 0315  CHOL 71  TRIG 66  HDL 27*  LDLCALC 31  CHOLHDL 2.6    Hematology Recent Labs  Lab 08/22/21 0241 08/23/21 0315 08/24/21 0319  WBC 11.7* 15.3* 14.0*  RBC 4.86 5.09 4.78  HGB 15.3 15.6 14.6  HCT 47.3 49.2 45.8  MCV 97.3 96.7 95.8  MCH 31.5 30.6 30.5  MCHC 32.3 31.7 31.9  RDW 15.3 15.4 15.4  PLT 251 257 236   Thyroid  Recent Labs  Lab 08/22/21 1516  TSH 1.462    BNP Recent Labs  Lab 08/22/21 0241  BNP 953.7*    DDimer No results for input(s): DDIMER in the last 168 hours.   Radiology    DG CHEST PORT 1 VIEW  Result Date: 08/23/2021 CLINICAL DATA:  Dyspnea. EXAM: PORTABLE CHEST 1 VIEW COMPARISON:  08/22/2021. FINDINGS: The heart is enlarged and the mediastinal contour is stable. Atherosclerotic calcification of the aorta is noted. The pulmonary vasculature is distended. Perihilar interstitial and airspace opacities are noted bilaterally. No effusion or pneumothorax. A loop recorder device is present over the left chest. No acute osseous abnormality. IMPRESSION: 1. Cardiomegaly with pulmonary vascular congestion. 2. Perihilar interstitial and airspace opacities, possible edema or infiltrate. Electronically Signed   By: Brett Fairy M.D.   On: 08/23/2021 00:59    Cardiac Studies   Pending FU echo  Patient Profile     [name] is a 76 y.o. male with a history of CAD s/p DES to LAD in 09/2020, nonischemic cardiomyopathy/chronic systolic CHF with EF as low as 35% in 2012 in the setting of polysubstance abuse with normalization of EF but back down to 40-45% on last Echo in 09/2020, paroxysmal atrial fibrillation/flutter s/p ablation in 2012 on Amiodarone and Eliquis, ascending aortic aneurysm, hypertension, CKD stage III, COPD, obstructive sleep apnea not compliant with CPAP, GERD, polycythemia vera, polysubstance abuse (tobacco, alcohol, heroin, cocaine) on Suboxone, and medication non-compliance who was admitted with acute on chronic CHF and atrial fibrillation with RVR on 08/22/2021 after presenting with shortness of breath. Cardiology consulted for assistance at the request of Dr. Dareen Piano.   Assessment & Plan    Acute on Chronic Systolic CHF Non-Ischemic Cardiomyopathy Initially diagnosed in 2012.  EF 35% at that time.  Felt to be secondary to polysubstance abuse and EF ultimately normalized.  However, LVEF back down to 40-45% on last echo in 09/2020.  He now presents with worsening shortness of breath and lower extremity edema in the setting of medication non-compliance. He has not refilled his Lasix since October. BNP elevated at 953.   - Hypervolemic, warm and wet - net negative 86, but 1500 cc out. Will transition IV amio to oral to reduce fluid intake.Can plan for 120 mg IV BID with minimal progress. Goal net negative 2 L ( if not reaching this goal can add 2.5 mg metolazone); if worsening respiratory status may need bipap. Crt is stable - Echo pending. Suspect EF is going to be worse than it was in 09/2020. -Suspect he will need Torsemide at discharge given significant gut edema. - BP soft at times so will hold off on adding any GDMT for now to allow for more diuresis. - Continue to monitor daily weights, strict I/Os, and renal function  Paroxysmal Atrial Fibrillation/Flutter History of paroxysmal atrial  fibrillation and flutter. S/p flutter ablation in 2012.  Presented in rapid atrial fibrillation/flutter with with rates in the 140s. Potassium 3.4. Magnesium pending. Patient had not been taking home Amiodarone or Eliquis. - Rates currently in the 110s to 120s. - TSH nl - continue IV amiodarone - CHA2DS-VASc = 4 (CHF, CAD, HTN, age). Home Eliquis 5mg  twice daily restarted. - Can consider TEE/DCCV once closer to euvolemia   CAD S/p DES to LAD in 09/2020. High-sensitivity troponin minimally elevated  and flat in the 30s consistent with demand ischemia from acute CHF and rapid atrial fibrillation. - No angina. - Continue Plavix monotherapy. Not on Aspirin due to DOAC. - Continue high-intensity statin.   Hypertension BP soft at times but stable. - Continue medications for CHF as above.   Hyperlipidemia LDL 83 in 3.2022.  - Continue home Lipitor 80mg  daily. - Repeat fasting lipid panel in the morning.   CKD Stage III Creatinine 1.54 on admission. Baseline between 1.4 to 1.6. - Continue to monitor.   Hypokalemia Potassium 3.4 on admission. Repleted by primary team. - Continue to monitor.    Otherwise, per primary team: - COPD - Obstructive sleep apnea on CPAP - Prediabetes  - Polysubstance abuse      For questions or updates, please contact [place] Please consult [email or web address] for contact info under        Signed, Janina Mayo, MD  08/24/2021, 8:55 AM

## 2021-08-24 NOTE — Progress Notes (Addendum)
° °  Subjective: NAOE  Patient was seen at bedside during rounds today. Pt reports feeling well. He has noticed improvement in [facility]. He denies chest pain and abdominal pain. No questions or concerns. Pt is updated on the plan for today.  Objective:  Vital signs in last 24 hours: Vitals:   08/24/21 0442 08/24/21 0923 08/24/21 1108 08/24/21 1121  BP: 117/78   (!) 110/95  Pulse: (!) 104 98  (!) 105  Resp: 19 18  (!) 25  Temp: 98 F (36.7 C)   98 F (36.7 C)  TempSrc: Oral   Oral  SpO2: 98%  98% 95%  Weight: 100.7 kg     Height:       Constitutional: alert, well-appearing, in NAD HENT: normocephalic, atraumatic, mucous membranes moist Cardiovascular: IRIR, no m/r/g, 2+ bilateral LE Pulmonary/Chest: mildly labored work of breathing with supplemental O2, LCTAB  Abdominal: soft, non-tender to palpation, non-distended Neurological: A&O x 3 Skin: warm and dry  Assessment/Plan:  Principal Problem:   Acute on chronic heart failure (HCC) Active Problems:   Atrial fibrillation (HCC)   Tobacco abuse   Polycythemia vera (HCC)   COPD (chronic obstructive pulmonary disease) (HCC)   Opioid use disorder  #Acute HFrEF exacerbation   #Hx of combined HF (EF 40-45% 09/2020) #Hx of medication nonadherence Hypervolemic on exam with mildly labored breathing on 2L, requiring supplemental O2. Diuresing slowly, weight down 6 kg in 4 days. 2D echo without reduced EF (35-40%), mod-severe MR, severe TR, and mild-mod aortic dilatation. No indication for cath at this time.  -Cardiology following, appreciate assistance   -IV Lasix 120 bid  -Add 2.5mg  metolazone if not reaching goal of net negative 2L  -GDMT limited by BP -Place on bipap if worsening respiratory status  -Strict ins and outs and daily weights -Trend BMP   #Paroxysmal Atrial Fibrillation/Flutter #Hx of A flutter in 2012 s/p ablation in 2012 #Medication nonadherence Rates improved 90-110's, compared to 130-140's on admission   -Cardiology following, appreciate assistance  -Continue PO amio  -Continue Eliquis 5 mg -Possible TEE/DCCV this admission    #CKD stage III a #Hypokalemia Cr improving 2.1 -> 1.8; baseline 1.54.   -Trend BMP -Replete electrolytes as needed   #Coronary Artery Disease s/p LAD DES in March 2022 #PAD -Continue Plavix 75 mg daily -Restarted Eliquis 5 mg daily -Continue Lipitor 40 mg daily -Follow up with cardiology as an outpatient    #Polysubstance abuse -Continue Suboxone 8-2 mg bid  -Monitor for signs of withdrawal   #COPD -Dulera daily -Albuterol as needed   #OSA -nightly CPAP   #Prediabetes A1c 6.0% this admission   Best Practice: Diet: Heart Healthy IVF: None,None VTE: Eliquis  Code: Full   [name], MD  Internal Medicine Resident, PGY-1 Pager: [phone] After 5pm on weekdays and 1pm on weekends: On Call pager [phone]

## 2021-08-24 NOTE — Progress Notes (Signed)
°   08/24/21 1121  Assess: MEWS Score  Temp 98 F (36.7 C)  BP (!) 110/95  Pulse Rate (!) 105  Resp (!) 25  SpO2 95 %  O2 Device Nasal Cannula  Assess: MEWS Score  MEWS Temp 0  MEWS Systolic 0  MEWS Pulse 1  MEWS RR 1  MEWS LOC 0  MEWS Score 2  MEWS Score Color Yellow  Assess: if the MEWS score is Yellow or Red  Were vital signs taken at a resting state? No  Focused Assessment No change from prior assessment  Early Detection of Sepsis Score *See Row Information* Low  MEWS guidelines implemented *See Row Information* No, previously yellow, continue vital signs every 4 hours  Treat  Pain Scale 0-10  Pain Score 0  Notify: Charge Nurse/RN  Name of Charge Nurse/RN Notified Tai Rn  Date Charge Nurse/RN Notified 08/24/21  Time Charge Nurse/RN Notified 0800  Notify: Provider  Provider Name/Title IM team  Date Provider Notified 08/24/21  Time Provider Notified 0830  Notification Type Rounds  Notification Reason Other (Comment)  Provider response At bedside  Date of Provider Response 08/24/21  Time of Provider Response 0830  Notify: Rapid Response  Name of Rapid Response RN Notified n/a  Document  Patient Outcome Not stable and remains on department  Progress note created (see row info) Yes

## 2021-08-25 DIAGNOSIS — I5043 Acute on chronic combined systolic (congestive) and diastolic (congestive) heart failure: Secondary | ICD-10-CM | POA: Diagnosis not present

## 2021-08-25 DIAGNOSIS — I2510 Atherosclerotic heart disease of native coronary artery without angina pectoris: Secondary | ICD-10-CM | POA: Diagnosis not present

## 2021-08-25 DIAGNOSIS — I4819 Other persistent atrial fibrillation: Secondary | ICD-10-CM

## 2021-08-25 LAB — CBC
HCT: 45.2 % (ref 39.0–52.0)
Hemoglobin: 14.8 g/dL (ref 13.0–17.0)
MCH: 30.9 pg (ref 26.0–34.0)
MCHC: 32.7 g/dL (ref 30.0–36.0)
MCV: 94.4 fL (ref 80.0–100.0)
Platelets: 225 10*3/uL (ref 150–400)
RBC: 4.79 MIL/uL (ref 4.22–5.81)
RDW: 15.2 % (ref 11.5–15.5)
WBC: 12.9 10*3/uL — ABNORMAL HIGH (ref 4.0–10.5)
nRBC: 0 % (ref 0.0–0.2)

## 2021-08-25 LAB — BASIC METABOLIC PANEL
Anion gap: 13 (ref 5–15)
BUN: 44 mg/dL — ABNORMAL HIGH (ref 8–23)
CO2: 25 mmol/L (ref 22–32)
Calcium: 8 mg/dL — ABNORMAL LOW (ref 8.9–10.3)
Chloride: 97 mmol/L — ABNORMAL LOW (ref 98–111)
Creatinine, Ser: 1.78 mg/dL — ABNORMAL HIGH (ref 0.61–1.24)
GFR, Estimated: 40 mL/min — ABNORMAL LOW (ref >60)
Glucose, Bld: 99 mg/dL (ref 70–99)
Potassium: 4 mmol/L (ref 3.5–5.1)
Sodium: 135 mmol/L (ref 135–145)

## 2021-08-25 LAB — ECHOCARDIOGRAM COMPLETE
Calc EF: 39.8 %
Height: 66 in
MV M vel: 4.58 m/s
MV Peak grad: 83.9 mmHg
P 1/2 time: 349 msec
Radius: 0.8 cm
S' Lateral: 4.5 cm
Single Plane A2C EF: 36.6 %
Single Plane A4C EF: 39.2 %
Weight: 3552.05 oz

## 2021-08-25 LAB — MAGNESIUM: Magnesium: 1.8 mg/dL (ref 1.7–2.4)

## 2021-08-25 MED ORDER — MUSCLE RUB 10-15 % EX CREA
TOPICAL_CREAM | CUTANEOUS | Status: DC | PRN
  Filled 2021-08-25: qty 85

## 2021-08-25 NOTE — Progress Notes (Signed)
Occupational Therapy Treatment Patient Details Name: Rucker Pridgeon MRN: [id number] DOB: 04-26-1946 Today's Date: 08/25/2021   History of present illness 76 yo admitted 2/3 with SOB and CHF. PMhx: CHF, COPD, AFib, NSTEMI, NICM, dermatitis, PAD, HLD, tobacco and ETOH use   OT comments  Patient continues to make steady progress towards goals in skilled OT session. Patient's session encompassed  bed mobility, ADLs, and further assessment of cognition. Patient lethargic and impulsive in session, found asleep and tangled in lines and leads despite NT detangling patient earlier. Patient falling asleep frequently in session (VSS, BP at 117/77) and requiring cues to arouse. Patient requiring step by step instructions to complete basic tasks in session, and impulsive when completing. Discharge remains appropriate, therapy will continue to follow.    Recommendations for follow up therapy are one component of a multi-disciplinary discharge planning process, led by the attending physician.  Recommendations may be updated based on patient status, additional functional criteria and insurance authorization.    Follow Up Recommendations  Home health OT    Assistance Recommended at Discharge Frequent or constant Supervision/Assistance  Patient can return home with the following  A little help with walking and/or transfers;A little help with bathing/dressing/bathroom;Direct supervision/assist for medications management   Equipment Recommendations  Tub/shower bench    Recommendations for Other Services      Precautions / Restrictions Precautions Precautions: Fall Precaution Comments: watch HR, on 4L O2 for mobility Restrictions Weight Bearing Restrictions: No       Mobility Bed Mobility Overal bed mobility: Needs Assistance Bed Mobility: Supine to Sit, Sit to Supine     Supine to sit: Supervision Sit to supine: Supervision        Transfers Overall transfer level: Needs  assistance Equipment used: None Transfers: Sit to/from Stand Sit to Stand: Min guard           General transfer comment: min gaurd due to impulsivity     Balance Overall balance assessment: Needs assistance Sitting-balance support: No upper extremity supported, Feet supported Sitting balance-Leahy Scale: Good     Standing balance support: Single extremity supported Standing balance-Leahy Scale: Fair                             ADL either performed or assessed with clinical judgement   ADL Overall ADL's : Needs assistance/impaired                 Upper Body Dressing : Set up;Sitting       Toilet Transfer: Min Psychiatric nurse Details (indicate cue type and reason): completing sit<>stand from EOB         Functional mobility during ADLs: Minimal assistance General ADL Comments: patient more lethargic in session, often falling asleep during session, increasingly impulsive and requiring step by step cues    Extremity/Trunk Assessment              Vision       Perception     Praxis      Cognition Arousal/Alertness: Lethargic Behavior During Therapy: Flat affect, WFL for tasks assessed/performed Overall Cognitive Status: Impaired/Different from baseline Area of Impairment: Memory, Following commands, Safety/judgement, Awareness, Problem solving                     Memory: Decreased short-term memory Following Commands: Follows one step commands with increased time, Follows multi-step commands inconsistently Safety/Judgement: Decreased awareness of safety, Decreased awareness of deficits Awareness: Emergent  Problem Solving: Slow processing, Decreased initiation, Difficulty sequencing, Requires verbal cues General Comments: patient alseep and tangled in wires upon entry, requiring step by step cues in order to adjust and fix lines, per NT patient had just been untangled from wires a few moments before, impulsive when attempting  to stand at edge of bed, throwing legs off of bed in order to gain momentum        Exercises      Shoulder Instructions       General Comments      Pertinent Vitals/ Pain       Pain Assessment Pain Assessment: No/denies pain  Home Living                                          Prior Functioning/Environment              Frequency  Min 2X/week        Progress Toward Goals  OT Goals(current goals can now be found in the care plan section)  Progress towards OT goals: Progressing toward goals  Acute Rehab OT Goals Patient Stated Goal: get stronger OT Goal Formulation: With patient Time For Goal Achievement: 09/05/21 Potential to Achieve Goals: [place] Discharge plan remains appropriate    Co-evaluation                 AM-PAC OT "6 Clicks" Daily Activity     Outcome Measure   Help from another person eating meals?: None Help from another person taking care of personal grooming?: A Little Help from another person toileting, which includes using toliet, bedpan, or urinal?: A Little Help from another person bathing (including washing, rinsing, drying)?: A Little Help from another person to put on and taking off regular upper body clothing?: A Little Help from another person to put on and taking off regular lower body clothing?: A Little 6 Click Score: 19    End of Session    OT Visit Diagnosis: Other abnormalities of gait and mobility (R26.89);Muscle weakness (generalized) (M62.81);Other symptoms and signs involving cognitive function   Activity Tolerance Patient limited by fatigue;Patient limited by lethargy   Patient Left in bed;with call bell/phone within reach   Nurse Communication Mobility status        Time: 1400-1413 OT Time Calculation (min): 13 min  Charges: OT General Charges $OT Visit: 1 Visit OT Treatments $Self Care/Home Management : 8-22 mins  Corinne Ports E. Heman Que, OTR/L Acute Rehabilitation  Services [phone] [place] 08/25/2021, 3:13 PM

## 2021-08-25 NOTE — Plan of Care (Signed)
  Problem: Education: Goal: Knowledge of General Education information will improve Description: Including pain rating scale, medication(s)/side effects and non-pharmacologic comfort measures Outcome: Progressing   Problem: Health Behavior/Discharge Planning: Goal: Ability to manage health-related needs will improve Outcome: Progressing   Problem: Clinical Measurements: Goal: Respiratory complications will improve Outcome: Progressing   

## 2021-08-25 NOTE — Progress Notes (Signed)
Physical Therapy Treatment Patient Details Name: [name] MRN: [id number] DOB: 1945/11/27 Today's Date: 08/25/2021   History of Present Illness 76 yo admitted 2/3 with SOB and CHF. PMhx: CHF, COPD, AFib, NSTEMI, NICM, dermatitis, PAD, HLD, tobacco and ETOH use    PT Comments    Pt progressing well with activity tolerance, required 2LO2 to maintain SPO2 88% and greater today. PT encouraged OOB with RN staff daily to progress to home-level mobility. PT to continue to follow.    Recommendations for follow up therapy are one component of a multi-disciplinary discharge planning process, led by the attending physician.  Recommendations may be updated based on patient status, additional functional criteria and insurance authorization.  Follow Up Recommendations  Home health PT     Assistance Recommended at Discharge Intermittent Supervision/Assistance  Patient can return home with the following A little help with walking and/or transfers;A little help with bathing/dressing/bathroom;Direct supervision/assist for medications management;Help with stairs or ramp for entrance   Equipment Recommendations  None recommended by PT    Recommendations for Other Services       Precautions / Restrictions Precautions Precautions: Fall Precaution Comments: watch HR, on 2L O2 for mobility Restrictions Weight Bearing Restrictions: No     Mobility  Bed Mobility Overal bed mobility: Needs Assistance Bed Mobility: Supine to Sit, Sit to Supine     Supine to sit: Supervision Sit to supine: Supervision        Transfers Overall transfer level: Needs assistance Equipment used: None Transfers: Sit to/from Stand Sit to Stand: Supervision           General transfer comment: for safety, cues to wait for PT to be present as pt attempting to stand before PT present/ready to assist if needed. STS x2 from EOB.    Ambulation/Gait Ambulation/Gait assistance: Min guard Gait  Distance (Feet): 45 Feet (x2 - standing rest break to monitor SPO2) Assistive device: Straight cane Gait Pattern/deviations: Wide base of support, Shuffle, Step-through pattern, Trunk flexed Gait velocity: decr     General Gait Details: PT adjusted cane to more appropriate height for pt, cues for hallway navigation, checking for dyspnea. when accurate pleth present, SpO2 appeared 88-92% on 2LO2.   Stairs             Wheelchair Mobility    Modified Rankin (Stroke Patients Only)       Balance Overall balance assessment: Needs assistance Sitting-balance support: No upper extremity supported, Feet supported Sitting balance-Leahy Scale: Good     Standing balance support: Single extremity supported Standing balance-Leahy Scale: Fair                              Cognition Arousal/Alertness: Awake/alert Behavior During Therapy: WFL for tasks assessed/performed Overall Cognitive Status: Impaired/Different from baseline Area of Impairment: Memory                     Memory: Decreased short-term memory Following Commands: Follows one step commands with increased time, Follows multi-step commands inconsistently Safety/Judgement: Decreased awareness of safety   Problem Solving: Requires tactile cues, Requires verbal cues, Difficulty sequencing General Comments: pt requires cues to watch for lines/leads, some impulsivity and safety cuing needed during mobility        Exercises General Exercises - Lower Extremity Ankle Circles/Pumps: AROM, Both, 10 reps, Seated (edema management) Long Arc Quad: AROM, Both, 10 reps, Seated (edema management)    General Comments General comments (skin  integrity, edema, etc.): SpO2 88-92% on 2lO2 during mobility, HRmax observed 120s bpm during mobility      Pertinent Vitals/Pain Pain Assessment Pain Assessment: Faces Faces Pain Scale: Hurts a little bit Pain Location: LEs (swelling) Pain Descriptors / Indicators:  Sore Pain Intervention(s): Limited activity within patient's tolerance, Monitored during session, Repositioned    Home Living                          Prior Function            PT Goals (current goals can now be found in the care plan section) Acute Rehab PT Goals Patient Stated Goal: return home PT Goal Formulation: With patient Time For Goal Achievement: 09/06/21 Potential to Achieve Goals: Good Progress towards PT goals: Progressing toward goals    Frequency    Min 3X/week      PT Plan Current plan remains appropriate    Co-evaluation              AM-PAC PT "6 Clicks" Mobility   Outcome Measure  Help needed turning from your back to your side while in a flat bed without using bedrails?: A Little Help needed moving from lying on your back to sitting on the side of a flat bed without using bedrails?: A Little Help needed moving to and from a bed to a chair (including a wheelchair)?: A Little Help needed standing up from a chair using your arms (e.g., wheelchair or bedside chair)?: A Little Help needed to walk in hospital room?: A Little Help needed climbing 3-5 steps with a railing? : A Little 6 Click Score: 18    End of Session Equipment Utilized During Treatment: Gait belt;Oxygen Activity Tolerance: Patient tolerated treatment well Patient left: in bed;with call bell/phone within reach (pt expresses he will press call button and wait for assist prior to mobilizing OOB) Nurse Communication: Mobility status PT Visit Diagnosis: Unsteadiness on feet (R26.81);Other abnormalities of gait and mobility (R26.89);Muscle weakness (generalized) (M62.81)     Time: 1027-2536 PT Time Calculation (min) (ACUTE ONLY): 19 min  Charges:  $Gait Training: 8-22 mins                     Stacie Glaze, PT DPT Acute Rehabilitation Services Pager [phone]  Office [phone]    [place] 08/25/2021, 5:07 PM

## 2021-08-25 NOTE — Care Management Important Message (Signed)
Important Message  Patient Details  Name: [name] MRN: [id number] Date of Birth: 1945-11-23   Medicare Important Message Given:  Yes     [name] 08/25/2021, 8:36 AM

## 2021-08-25 NOTE — Progress Notes (Signed)
°  Subjective: NAOE  Patient was seen at bedside during rounds today. Pt reports feeling well. He has noticed improvement in [facility]. He denies chest pain and abdominal pain. No questions or concerns. Pt is updated on the plan for today.  Objective:  Vital signs in last 24 hours: Vitals:   08/24/21 1427 08/24/21 1915 08/25/21 0435 08/25/21 0828  BP: 114/74 115/64 111/76   Pulse: 65 100 100   Resp: 14 16 20    Temp:  97.7 F (36.5 C) 99 F (37.2 C)   TempSrc:  Oral Oral   SpO2: 92% 95% 96% 97%  Weight:   98.6 kg   Height:       Constitutional: alert, well-appearing, in NAD HENT: normocephalic, atraumatic, mucous membranes moist Cardiovascular: IRIR, no m/r/g, 2+ bilateral LE up to knee Pulmonary/Chest: mildly labored work of breathing with supplemental O2, LCTAB  MSK: knees are not warm to touch  Abdominal: soft, non-tender to palpation, non-distended Neurological: A&O x 3 Skin: warm and dry  Assessment/Plan:  Principal Problem:   Acute on chronic heart failure (HCC) Active Problems:   Atrial fibrillation (HCC)   Tobacco abuse   Polycythemia vera (HCC)   COPD (chronic obstructive pulmonary disease) (HCC)   Opioid use disorder   #Acute HFrEF exacerbation   #Hx of combined HF (EF 40-45% 09/2020) #Hx of medication nonadherence Hypervolemic on exam with mildly labored breathing on 2L. Diuresing slowly, with 3.4L so far. 2D echo without reduced EF (35-40%), and no indication for cath at this time. Still has fluid to give.  -Cardiology following, appreciate assistance   -Continuing IV Lasix 120 bid  -Add 2.5mg  metolazone if not reaching goal of net negative 2L  -GDMT limited by BP -Place on bipap if worsening respiratory status  -Strict ins and outs and daily weights -Trend BMP   #Paroxysmal Atrial Fibrillation/Flutter #Hx of A flutter in 2012 s/p ablation in 2012 #Medication nonadherence Rates improved 95-100, compared to 130-140's on admission  -Cardiology following,  appreciate assistance  -Continue PO amio  -Continue Eliquis 5 mg -Possible TEE/DCCV this admission    #CKD stage III a #Hypokalemia Cr improving 2.1 -> 1.8 -> 1.7; baseline 1.54.   -Trend BMP -Replete electrolytes as needed  #Left knee pain  Complains of limited mobility d/t left knee pain. Not warm to touch. Does have pitting edema up to the knee. Suspect from ongoing fluid overload.  - Voltaren gel - Continue to diurese    #Coronary Artery Disease s/p LAD DES in March 2022 #PAD -Continue Plavix 75 mg daily -Restarted Eliquis 5 mg daily -Continue Lipitor 40 mg daily -Follow up with cardiology as an outpatient    #Polysubstance abuse -Continue Suboxone 8-2 mg bid  -Monitor for signs of withdrawal   #COPD -Dulera daily -Albuterol as needed   #OSA -nightly CPAP   #Prediabetes A1c 6.0% this admission   Best Practice: Diet: Heart Healthy IVF: None,None VTE: Eliquis  Code: Full   [name], MD  Internal Medicine Resident, PGY-1 Pager: [phone] After 5pm on weekdays and 1pm on weekends: On Call pager [phone]

## 2021-08-25 NOTE — Progress Notes (Signed)
Progress Note  Patient Name: [name] Date of Encounter: 08/25/2021  [facility] Cardiologist: Sinclair Grooms, MD    Subjective    76 yo with dilated CM, EF 25% in 2012,  hx of PAF , he is on amio  EF normalized eventually , Then declined back to 40% by echo March 2022. He has been noncompliant with medications.   Had run out of his lasix, eliquis, amiodarone,  He presented with worsening shortness of breath and leg edema. He has diuresed 1.6 L so far during this admission. He is on Lasix 120 mg IV twice a day. Creatinine is 1.78.  Inpatient Medications    Scheduled Meds:  amiodarone  400 mg Oral BID   apixaban  5 mg Oral BID   atorvastatin  80 mg Oral Daily   buprenorphine-naloxone  1 tablet Sublingual BID   clopidogrel  75 mg Oral Daily   fluticasone furoate-vilanterol  1 puff Inhalation Daily   umeclidinium bromide  1 puff Inhalation Daily   Continuous Infusions:  sodium chloride Stopped (08/23/21 0149)   furosemide 120 mg (08/24/21 1732)   PRN Meds: sodium chloride, acetaminophen **OR** acetaminophen, albuterol, Muscle Rub, senna-docusate   Vital Signs    Vitals:   08/24/21 1121 08/24/21 1427 08/24/21 1915 08/25/21 0435  BP: (!) 110/95 114/74 115/64 111/76  Pulse: (!) 105 65 100 100  Resp: (!) 25 14 16 20   Temp: 98 F (36.7 C)  97.7 F (36.5 C) 99 F (37.2 C)  TempSrc: Oral  Oral Oral  SpO2: 95% 92% 95% 96%  Weight:    98.6 kg  Height:        Intake/Output Summary (Last 24 hours) at 08/25/2021 3335 Last data filed at 08/25/2021 0805 Gross per 24 hour  Intake 1140.11 ml  Output 2925 ml  Net -1784.89 ml   Last 3 Weights 08/25/2021 08/24/2021 08/23/2021  Weight (lbs) 217 lb 6 oz 222 lb 0.1 oz 219 lb 12.8 oz  Weight (kg) 98.6 kg 100.7 kg 99.7 kg      Telemetry    Afib at 100-105- Personally Reviewed  ECG      - Personally Reviewed  Physical Exam   GEN: elderly male, moderately obese  Neck: No JVD Cardiac: irreg. Irreg.    tachy Respiratory: Clear to auscultation bilaterally. GI: Soft, nontender, non-distended  MS: 3+ tense edema  Neuro:  Nonfocal  Psych: Normal affect   Labs    High Sensitivity Troponin:   Recent Labs  Lab 08/22/21 0241 08/22/21 0455  TROPONINIHS 30* 31*     Chemistry Recent Labs  Lab 08/22/21 0241 08/23/21 0315 08/24/21 0319 08/25/21 0217  NA 142 138 135 135  K 3.4* 4.7 4.2 4.0  CL 106 102 99 97*  CO2 23 21* 22 25  GLUCOSE 119* 111* 135* 99  BUN 28* 40* 45* 44*  CREATININE 1.54* 2.18* 2.07* 1.78*  CALCIUM 8.5* 8.6* 8.3* 8.0*  MG  --  2.1 1.8  --   PROT 7.2 7.2  --   --   ALBUMIN 3.1* 3.1*  --   --   AST 29 32  --   --   ALT 23 32  --   --   ALKPHOS 152* 173*  --   --   BILITOT 0.7 0.7  --   --   GFRNONAA 47* 31* 33* 40*  ANIONGAP 13 15 14 13     Lipids  Recent Labs  Lab 08/23/21 0315  CHOL  71  TRIG 66  HDL 27*  LDLCALC 31  CHOLHDL 2.6    Hematology Recent Labs  Lab 08/23/21 0315 08/24/21 0319 08/25/21 0217  WBC 15.3* 14.0* 12.9*  RBC 5.09 4.78 4.79  HGB 15.6 14.6 14.8  HCT 49.2 45.8 45.2  MCV 96.7 95.8 94.4  MCH 30.6 30.5 30.9  MCHC 31.7 31.9 32.7  RDW 15.4 15.4 15.2  PLT 257 236 225   Thyroid  Recent Labs  Lab 08/22/21 1516  TSH 1.462    BNP Recent Labs  Lab 08/22/21 0241  BNP 953.7*    DDimer No results for input(s): DDIMER in the last 168 hours.   Radiology    No results found.  Cardiac Studies      Patient Profile     76 y.o. male with atrial fib, CHF , CAD   Assessment & Plan    Acute on chronic combined systolic and diastolic congestive heart failure: Patient presented to the hospital with increased shortness of breath and leg edema secondary to stopping all of his medications.  He has not been restarted on IV Lasix, Eliquis, amiodarone.  He is diuresed 1.7 L so far.  He feels quite a bit better all.  His legs are still quite edematous. Echo from yesterday - prelim reading - EF 35-40%.  Mod - severe MR,  severe TR,    mild - mod aortic dilatation   Slow improvement .  Cont to diurese  2.   Atrial fib:   rate is improving .  Is back on eliquis ( he had stopped ) ,  rate is still a bit rapid.   3.   CAD :    no angina     4.  Hyperlipidemia :  cont atorvastatin    For questions or updates, please contact [place] Please consult [email or web address] for contact info under        Signed, Mertie Moores, MD  08/25/2021, 8:22 AM

## 2021-08-26 DIAGNOSIS — I480 Paroxysmal atrial fibrillation: Secondary | ICD-10-CM | POA: Diagnosis not present

## 2021-08-26 DIAGNOSIS — N1831 Chronic kidney disease, stage 3a: Secondary | ICD-10-CM | POA: Diagnosis not present

## 2021-08-26 DIAGNOSIS — E876 Hypokalemia: Secondary | ICD-10-CM | POA: Diagnosis not present

## 2021-08-26 DIAGNOSIS — I5043 Acute on chronic combined systolic (congestive) and diastolic (congestive) heart failure: Secondary | ICD-10-CM | POA: Diagnosis not present

## 2021-08-26 DIAGNOSIS — F1910 Other psychoactive substance abuse, uncomplicated: Secondary | ICD-10-CM

## 2021-08-26 DIAGNOSIS — M25562 Pain in left knee: Secondary | ICD-10-CM

## 2021-08-26 DIAGNOSIS — I4819 Other persistent atrial fibrillation: Secondary | ICD-10-CM | POA: Diagnosis not present

## 2021-08-26 LAB — BASIC METABOLIC PANEL
Anion gap: 13 (ref 5–15)
BUN: 44 mg/dL — ABNORMAL HIGH (ref 8–23)
CO2: 28 mmol/L (ref 22–32)
Calcium: 8.4 mg/dL — ABNORMAL LOW (ref 8.9–10.3)
Chloride: 95 mmol/L — ABNORMAL LOW (ref 98–111)
Creatinine, Ser: 1.69 mg/dL — ABNORMAL HIGH (ref 0.61–1.24)
GFR, Estimated: 42 mL/min — ABNORMAL LOW (ref >60)
Glucose, Bld: 74 mg/dL (ref 70–99)
Potassium: 3.8 mmol/L (ref 3.5–5.1)
Sodium: 136 mmol/L (ref 135–145)

## 2021-08-26 LAB — CBC
HCT: 46.8 % (ref 39.0–52.0)
Hemoglobin: 15.1 g/dL (ref 13.0–17.0)
MCH: 30.5 pg (ref 26.0–34.0)
MCHC: 32.3 g/dL (ref 30.0–36.0)
MCV: 94.5 fL (ref 80.0–100.0)
Platelets: 222 10*3/uL (ref 150–400)
RBC: 4.95 MIL/uL (ref 4.22–5.81)
RDW: 15.2 % (ref 11.5–15.5)
WBC: 11.3 10*3/uL — ABNORMAL HIGH (ref 4.0–10.5)
nRBC: 0 % (ref 0.0–0.2)

## 2021-08-26 MED ORDER — DICLOFENAC SODIUM 1 % EX GEL
4.0000 g | Freq: Four times a day (QID) | CUTANEOUS | Status: DC
  Administered 2021-08-26 – 2021-08-31 (×13): 4 g via TOPICAL
  Filled 2021-08-26: qty 100

## 2021-08-26 MED ORDER — MAGNESIUM SULFATE 2 GM/50ML IV SOLN
2.0000 g | Freq: Once | INTRAVENOUS | Status: AC
  Administered 2021-08-26: 2 g via INTRAVENOUS
  Filled 2021-08-26: qty 50

## 2021-08-26 MED ORDER — DICLOFENAC SODIUM 1 % EX GEL
2.0000 g | Freq: Four times a day (QID) | CUTANEOUS | Status: DC | PRN
  Administered 2021-08-26: 2 g via TOPICAL
  Filled 2021-08-26: qty 100

## 2021-08-26 MED ORDER — BENZONATATE 100 MG PO CAPS
200.0000 mg | ORAL_CAPSULE | Freq: Three times a day (TID) | ORAL | Status: DC | PRN
  Administered 2021-08-26: 200 mg via ORAL
  Filled 2021-08-26: qty 2

## 2021-08-26 MED ORDER — POTASSIUM CHLORIDE CRYS ER 20 MEQ PO TBCR
20.0000 meq | EXTENDED_RELEASE_TABLET | Freq: Once | ORAL | Status: AC
  Administered 2021-08-26: 20 meq via ORAL
  Filled 2021-08-26: qty 1

## 2021-08-26 NOTE — Progress Notes (Signed)
Progress Note  Patient Name: [name] Date of Encounter: 08/26/2021  [facility] Cardiologist: Sinclair Grooms, MD    Subjective    76 yo with dilated CM, EF 25% in 2012,  hx of PAF , he is on amio  EF normalized eventually , Then declined back to 40% by echo March 2022. He has been noncompliant with medications.   Had run out of his lasix, eliquis, amiodarone,  He presented with worsening shortness of breath and leg edema.  He is diuresed 3.4 L so far during this admission. His breathing seems to be better. Creatinine remained stable at 1.69.  Inpatient Medications    Scheduled Meds:  amiodarone  400 mg Oral BID   apixaban  5 mg Oral BID   atorvastatin  80 mg Oral Daily   buprenorphine-naloxone  1 tablet Sublingual BID   clopidogrel  75 mg Oral Daily   fluticasone furoate-vilanterol  1 puff Inhalation Daily   umeclidinium bromide  1 puff Inhalation Daily   Continuous Infusions:  sodium chloride Stopped (08/23/21 0149)   furosemide 120 mg (08/26/21 0748)   PRN Meds: sodium chloride, acetaminophen **OR** acetaminophen, albuterol, benzonatate, Muscle Rub, senna-docusate   Vital Signs    Vitals:   08/25/21 2330 08/26/21 0225 08/26/21 0346 08/26/21 0823  BP:   117/83   Pulse:   96   Resp:   18   Temp:   97.7 F (36.5 C)   TempSrc:   Oral   SpO2: 98% 98% 99% 92%  Weight:   91.6 kg   Height:        Intake/Output Summary (Last 24 hours) at 08/26/2021 0903 Last data filed at 08/26/2021 0820 Gross per 24 hour  Intake 540 ml  Output 2400 ml  Net -1860 ml    Last 3 Weights 08/26/2021 08/25/2021 08/24/2021  Weight (lbs) 202 lb 217 lb 6 oz 222 lb 0.1 oz  Weight (kg) 91.627 kg 98.6 kg 100.7 kg      Telemetry      Personally Reviewed  ECG      - Personally Reviewed  Physical Exam   Physical Exam: Blood pressure 117/83, pulse 96, temperature 97.7 F (36.5 C), temperature source Oral, resp. rate 18, height 5\' 6"  (1.676 m), weight 91.6 kg, SpO2  92 %.  GEN:  elderly male,   HEENT: Normal NECK: No JVD; No carotid bruits LYMPHATICS: No lymphadenopathy CARDIAC: irreg irreg ,  + systolic murmur  RESPIRATORY:  few rales,  ABDOMEN: Soft, non-tender, non-distended MUSCULOSKELETAL:  + leg edema No deformity  SKIN: Warm and dry NEUROLOGIC:  Alert and oriented x 3   Labs    High Sensitivity Troponin:   Recent Labs  Lab 08/22/21 0241 08/22/21 0455  TROPONINIHS 30* 31*      Chemistry Recent Labs  Lab 08/22/21 0241 08/23/21 0315 08/24/21 0319 08/25/21 0217 08/26/21 0336  NA 142 138 135 135 136  K 3.4* 4.7 4.2 4.0 3.8  CL 106 102 99 97* 95*  CO2 23 21* 22 25 28   GLUCOSE 119* 111* 135* 99 74  BUN 28* 40* 45* 44* 44*  CREATININE 1.54* 2.18* 2.07* 1.78* 1.69*  CALCIUM 8.5* 8.6* 8.3* 8.0* 8.4*  MG  --  2.1 1.8 1.8  --   PROT 7.2 7.2  --   --   --   ALBUMIN 3.1* 3.1*  --   --   --   AST 29 32  --   --   --  ALT 23 32  --   --   --   ALKPHOS 152* 173*  --   --   --   BILITOT 0.7 0.7  --   --   --   GFRNONAA 47* 31* 33* 40* 42*  ANIONGAP 13 15 14 13 13      Lipids  Recent Labs  Lab 08/23/21 0315  CHOL 71  TRIG 66  HDL 27*  LDLCALC 31  CHOLHDL 2.6     Hematology Recent Labs  Lab 08/24/21 0319 08/25/21 0217 08/26/21 0336  WBC 14.0* 12.9* 11.3*  RBC 4.78 4.79 4.95  HGB 14.6 14.8 15.1  HCT 45.8 45.2 46.8  MCV 95.8 94.4 94.5  MCH 30.5 30.9 30.5  MCHC 31.9 32.7 32.3  RDW 15.4 15.2 15.2  PLT 236 225 222    Thyroid  Recent Labs  Lab 08/22/21 1516  TSH 1.462     BNP Recent Labs  Lab 08/22/21 0241  BNP 953.7*     DDimer No results for input(s): DDIMER in the last 168 hours.   Radiology    ECHOCARDIOGRAM COMPLETE  Result Date: 08/25/2021    ECHOCARDIOGRAM REPORT   Patient Name:   [name] Date of Exam: 08/24/2021 Medical Rec #:  [id number]                Height:       66.0 in Accession #:    [id number]               Weight:       222.0 lb Date of Birth:  [birth year]                BSA:          2.091 m Patient Age:    40 years                 BP:           114/74 mmHg Patient Gender: M                        HR:           102 bpm. Exam Location:  Inpatient Procedure: 2D Echo, Cardiac Doppler and Color Doppler Indications:    CHF-Acute systolic  History:        Patient has prior history of Echocardiogram examinations, most                 recent 10/09/2020. CHF, CAD, Mitral Valve Disease and Aortic                 Valve Disease, Arrythmias:Atrial Fibrillation and Atrial                 Flutter; Risk Factors:Hypertension and Sleep Apnea. S/P PCI. S/P                 ablation. GERD. Polysubstance abuse. Ascending aorta aneurysm.                 CKD. NICM.  Sonographer:    Clayton Lefort RDCS (AE) Referring Phys: [id number] Darreld Mclean  Sonographer Comments: Patient is morbidly obese. Image acquisition challenging due to respiratory motion and Image acquisition challenging due to patient body habitus. IMPRESSIONS  1. Left ventricular ejection fraction, by estimation, is 35 to 40%. The left ventricle has moderately decreased function. The left ventricle demonstrates global hypokinesis. There is moderate left ventricular hypertrophy. Left ventricular diastolic parameters are  indeterminate.  2. Right ventricular systolic function is normal. The right ventricular size is normal. There is normal pulmonary artery systolic pressure.  3. Right atrial size was mildly dilated.  4. PISA 0.8 cm     ERO 0.34 cm2     MR Vol 74ml. The mitral valve is normal in structure. Severe mitral valve regurgitation. No evidence of mitral stenosis.  5. Tricuspid valve regurgitation is severe.  6. The aortic valve is tricuspid. Aortic valve regurgitation is moderate. No aortic stenosis is present.  7. Aortic dilatation noted. There is moderate dilatation of the ascending aorta, measuring 49 mm.  8. The inferior vena cava is dilated in size with <50% respiratory variability, suggesting right atrial pressure of 15 mmHg.  Comparison(s): Prior images reviewed side by side. FINDINGS  Left Ventricle: Left ventricular ejection fraction, by estimation, is 35 to 40%. The left ventricle has moderately decreased function. The left ventricle demonstrates global hypokinesis. The left ventricular internal cavity size was normal in size. There is moderate left ventricular hypertrophy. Left ventricular diastolic parameters are indeterminate. Right Ventricle: The right ventricular size is normal. No increase in right ventricular wall thickness. Right ventricular systolic function is normal. There is normal pulmonary artery systolic pressure. The tricuspid regurgitant velocity is 2.27 m/s, and  with an assumed right atrial pressure of 8 mmHg, the estimated right ventricular systolic pressure is 33.2 mmHg. Left Atrium: Left atrial size was normal in size. Right Atrium: Right atrial size was mildly dilated. Pericardium: There is no evidence of pericardial effusion. Mitral Valve: PISA 0.8 cm ERO 0.34 cm2 MR Vol 45ml. The mitral valve is normal in structure. Severe mitral valve regurgitation. No evidence of mitral valve stenosis. Tricuspid Valve: The tricuspid valve is normal in structure. Tricuspid valve regurgitation is severe. No evidence of tricuspid stenosis. Aortic Valve: The aortic valve is tricuspid. Aortic valve regurgitation is moderate. Aortic regurgitation PHT measures 349 msec. No aortic stenosis is present. Pulmonic Valve: The pulmonic valve was normal in structure. Pulmonic valve regurgitation is mild. No evidence of pulmonic stenosis. Aorta: Aortic dilatation noted. There is moderate dilatation of the ascending aorta, measuring 49 mm. Venous: The inferior vena cava is dilated in size with less than 50% respiratory variability, suggesting right atrial pressure of 15 mmHg. IAS/Shunts: No atrial level shunt detected by color flow Doppler.  LEFT VENTRICLE PLAX 2D LVIDd:         5.50 cm LVIDs:         4.50 cm LV PW:         1.50 cm LV IVS:         1.50 cm LVOT diam:     1.80 cm LV SV:         48 LV SV Index:   23 LVOT Area:     2.54 cm  LV Volumes (MOD) LV vol d, MOD A2C: 191.0 ml LV vol d, MOD A4C: 176.0 ml LV vol s, MOD A2C: 121.0 ml LV vol s, MOD A4C: 107.0 ml LV SV MOD A2C:     70.0 ml LV SV MOD A4C:     176.0 ml LV SV MOD BP:      77.8 ml RIGHT VENTRICLE          IVC RV Basal diam:  3.40 cm  IVC diam: 3.00 cm TAPSE (M-mode): 1.4 cm LEFT ATRIUM           Index        RIGHT ATRIUM  Index LA diam:      4.10 cm 1.96 cm/m   RA Area:     26.50 cm LA Vol (A4C): 68.3 ml 32.67 ml/m  RA Volume:   87.20 ml  41.71 ml/m  AORTIC VALVE LVOT Vmax:   129.00 cm/s LVOT Vmean:  80.000 cm/s LVOT VTI:    0.187 m AI PHT:      349 msec  AORTA Ao Root diam: 3.80 cm Ao Asc diam:  4.90 cm MR Peak grad:    83.9 mmHg    TRICUSPID VALVE MR Mean grad:    58.0 mmHg    TR Peak grad:   20.6 mmHg MR Vmax:         458.00 cm/s  TR Vmax:        227.00 cm/s MR Vmean:        365.0 cm/s MR PISA:         4.02 cm     SHUNTS MR PISA Eff ROA: 34 mm       Systemic VTI:  0.19 m MR PISA Radius:  0.80 cm      Systemic Diam: 1.80 cm Candee Furbish MD Electronically signed by Candee Furbish MD Signature Date/Time: 08/25/2021/11:10:13 AM    Final     Cardiac Studies      Patient Profile     76 y.o. male with atrial fib, CHF , CAD   Assessment & Plan    Acute on chronic combined systolic and diastolic congestive heart failure: Patient presented to the hospital with increased shortness of breath and leg edema secondary to stopping all of his medications.   He has diuresed 3.4 L so far.  We will continue current medications.  Medications prior to admission.    2.   Atrial fib:   rate is improving .  Is back on eliquis ( he had stopped ) ,  rate is still a bit rapid.   3.   CAD :    no angina     4.  Hyperlipidemia :  cont atorvastatin    For questions or updates, please contact [place] Please consult [email or web address] for contact info under        Signed, Mertie Moores, MD  08/26/2021, 9:03 AM

## 2021-08-26 NOTE — Progress Notes (Signed)
Patient refused BIPAP. V60 still at bedside. Patient states he doesn't wear BIPAP or CPAP at home. Told him to call if he changes his mind.

## 2021-08-26 NOTE — Plan of Care (Signed)
  Problem: Education: Goal: Knowledge of General Education information will improve Description: Including pain rating scale, medication(s)/side effects and non-pharmacologic comfort measures Outcome: Progressing   Problem: Clinical Measurements: Goal: Respiratory complications will improve Outcome: Progressing   Problem: Activity: Goal: Risk for activity intolerance will decrease Outcome: Progressing   

## 2021-08-26 NOTE — Progress Notes (Addendum)
°  Subjective: NAOE  Patient was seen at bedside during rounds today. Pt reports feeling well. He has noticed improvement in [facility]. He denies chest pain and abdominal pain. No questions or concerns. Pt is updated on the plan for today.  Objective:  Vital signs in last 24 hours: Vitals:   08/26/21 0225 08/26/21 0346 08/26/21 0823 08/26/21 1123  BP:  117/83  103/71  Pulse:  96  95  Resp:  18  17  Temp:  97.7 F (36.5 C)  97.7 F (36.5 C)  TempSrc:  Oral  Oral  SpO2: 98% 99% 92% 98%  Weight:  91.6 kg    Height:       Constitutional: alert, well-appearing, in NAD HENT: normocephalic, atraumatic, mucous membranes moist Cardiovascular: IRIR, no m/r/g, 2+ bilateral LE up to knee Pulmonary/Chest: mildly labored work of breathing with supplemental O2, LCTAB  MSK: knees are not warm to touch  Abdominal: soft, non-tender to palpation, non-distended Neurological: A&O x 3 Skin: warm and dry  Assessment/Plan:  Principal Problem:   Acute on chronic heart failure (HCC) Active Problems:   Atrial fibrillation (HCC)   Tobacco abuse   Polycythemia vera (HCC)   COPD (chronic obstructive pulmonary disease) (HCC)   Opioid use disorder    #Acute HFrEF exacerbation   #Hx of combined HF (EF 40-45% 09/2020) #Hx of medication nonadherence Hypervolemic on exam with mildly labored breathing on 3L. Diuresing slowly, with 2.2.L output yesterday. 2D echo without reduced EF (35-40%), and no indication for cath at this time. Still volume overloaded on exam.  -Cardiology following, appreciate assistance   -Received IV Lasix 120 mg today -Another IV Lasix 80 mg today.  We will transition patient to IV Lasix 80 mg daily beginning tomorrow -Trend BMP -GDMT limited by BP -Place on bipap if worsening respiratory status  -Strict ins and outs and daily weights -Wean supplemental O2 as tolerated    #Paroxysmal Atrial Fibrillation/Flutter #Hx of A flutter in 2012 s/p ablation in 2012 #Medication  nonadherence Tachycardic but rates improved 95-100  -Cardiology following, appreciate assistance  -Continue PO amio  -Continue Eliquis 5 mg -Possible TEE/DCCV this admission    #CKD stage III a #Hypokalemia Cr bump from 1.7 -> 1.9 in s/o ongoing diuresis (baseline 1.54).   -Reduce Lasix dose (see above) -Trend BMP -Replete electrolytes as needed  #Left knee pain, resolved  Suspect 2/2 ongoing fluid overload. Improved with Voltaren gel.  - Voltaren gel - Continue to diurese    #Coronary Artery Disease s/p LAD DES in March 2022 #PAD -Continue Plavix 75 mg daily -Restarted Eliquis 5 mg daily -Continue Lipitor 40 mg daily -Follow up with cardiology as an outpatient    #Polysubstance abuse -Continue Suboxone 8-2 mg bid  -Monitor for signs of withdrawal   #COPD -Dulera daily -Albuterol as needed   #OSA -nightly CPAP   #Prediabetes A1c 6.0% this admission   Best Practice: Diet: Heart Healthy IVF: None,None VTE: Eliquis  Code: Full   [name], MD  Internal Medicine Resident, PGY-1 Pager: [phone] After 5pm on weekdays and 1pm on weekends: On Call pager [phone]

## 2021-08-26 NOTE — Plan of Care (Signed)
  Problem: Clinical Measurements: Goal: Respiratory complications will improve Outcome: Progressing   

## 2021-08-26 NOTE — Progress Notes (Signed)
Mobility Specialist Progress Note:   08/26/21 1020  Mobility  Activity Ambulated with assistance in hallway;Ambulated with assistance in room  Level of Assistance Contact guard assist, steadying assist  Assistive Device Four wheel walker  Distance Ambulated (ft) 50 ft  Activity Response Tolerated fair  $Mobility charge 1 Mobility    During Mobility: HR 112 bpm  Pt expressed mild pain "all over" this am, especially in BLE. Pt back to ambulate 60f with rollator, distance limited d/t pain. Pt back sitting EOB with all needs met.    ANelta NumbersMobility Specialist  Phone [phone]

## 2021-08-26 NOTE — Progress Notes (Signed)
Pt's O2 sat temporarily drops to 70s when pt takes his nasal cannula/BiPAP off.

## 2021-08-27 DIAGNOSIS — I5043 Acute on chronic combined systolic (congestive) and diastolic (congestive) heart failure: Secondary | ICD-10-CM | POA: Diagnosis not present

## 2021-08-27 DIAGNOSIS — I4819 Other persistent atrial fibrillation: Secondary | ICD-10-CM | POA: Diagnosis not present

## 2021-08-27 LAB — BASIC METABOLIC PANEL
Anion gap: 11 (ref 5–15)
BUN: 46 mg/dL — ABNORMAL HIGH (ref 8–23)
CO2: 31 mmol/L (ref 22–32)
Calcium: 8.3 mg/dL — ABNORMAL LOW (ref 8.9–10.3)
Chloride: 94 mmol/L — ABNORMAL LOW (ref 98–111)
Creatinine, Ser: 1.87 mg/dL — ABNORMAL HIGH (ref 0.61–1.24)
GFR, Estimated: 37 mL/min — ABNORMAL LOW (ref >60)
Glucose, Bld: 115 mg/dL — ABNORMAL HIGH (ref 70–99)
Potassium: 4 mmol/L (ref 3.5–5.1)
Sodium: 136 mmol/L (ref 135–145)

## 2021-08-27 LAB — CBC
HCT: 44.7 % (ref 39.0–52.0)
Hemoglobin: 14.6 g/dL (ref 13.0–17.0)
MCH: 30.6 pg (ref 26.0–34.0)
MCHC: 32.7 g/dL (ref 30.0–36.0)
MCV: 93.7 fL (ref 80.0–100.0)
Platelets: 242 10*3/uL (ref 150–400)
RBC: 4.77 MIL/uL (ref 4.22–5.81)
RDW: 14.8 % (ref 11.5–15.5)
WBC: 12 10*3/uL — ABNORMAL HIGH (ref 4.0–10.5)
nRBC: 0 % (ref 0.0–0.2)

## 2021-08-27 LAB — MAGNESIUM: Magnesium: 2.4 mg/dL (ref 1.7–2.4)

## 2021-08-27 MED ORDER — FUROSEMIDE 10 MG/ML IJ SOLN
80.0000 mg | Freq: Every day | INTRAMUSCULAR | Status: DC
  Administered 2021-08-28: 80 mg via INTRAVENOUS
  Filled 2021-08-27: qty 8

## 2021-08-27 MED ORDER — STERILE WATER FOR INJECTION IJ SOLN
5.0000 mL | Freq: Once | INTRAMUSCULAR | Status: AC
  Administered 2021-08-27: 5 mL via INTRAMUSCULAR
  Filled 2021-08-27: qty 10

## 2021-08-27 MED ORDER — ACETAZOLAMIDE SODIUM 500 MG IJ SOLR
500.0000 mg | Freq: Once | INTRAMUSCULAR | Status: AC
  Administered 2021-08-27: 500 mg via INTRAVENOUS
  Filled 2021-08-27: qty 500

## 2021-08-27 NOTE — Plan of Care (Signed)
°  Problem: Education: Goal: Knowledge of General Education information will improve Description: Including pain rating scale, medication(s)/side effects and non-pharmacologic comfort measures Outcome: Progressing   Problem: Clinical Measurements: Goal: Ability to maintain clinical measurements within normal limits will improve Outcome: Progressing Goal: Diagnostic test results will improve Outcome: Progressing   Problem: Activity: Goal: Risk for activity intolerance will decrease Outcome: Progressing   

## 2021-08-27 NOTE — Plan of Care (Signed)
  Problem: Elimination: Goal: Will not experience complications related to urinary retention Outcome: Progressing   Problem: Safety: Goal: Ability to remain free from injury will improve Outcome: Progressing   

## 2021-08-27 NOTE — Progress Notes (Signed)
Physical Therapy Treatment Patient Details Name: [name] MRN: [id number] DOB: 1946-03-24 Today's Date: 08/27/2021   History of Present Illness 76 yo admitted 2/3 with SOB and CHF, pt reports non-compliance with medications. PMhx: CHF, COPD, AFib, NSTEMI, NICM, dermatitis, PAD, HLD, tobacco and ETOH use.    PT Comments    Pt received seated EOB, bed alarm sounding and pt using urinal with O2 [place] doffed, pt agreeable to therapy session with encouragement. Pt impulsive, with decreased safety awareness and not attending to lines/leads and did desat with standing at bedside on RA, pt assisted to don 2L O2  and WFL on 2L for rest of session. Pt performs transfers and gait with Supervision to min guard using standard cane. Pt limiting gait distances to/from bathroom and not agreeable to hallway gait progression due to fatigue, pt noted to be lethargic throughout and frequently closing eyes, seeming to doze off. Pt up in chair with chair alarm on at end of session, RN notified. Pt continues to benefit from PT services to progress toward functional mobility goals.   Recommendations for follow up therapy are one component of a multi-disciplinary discharge planning process, led by the attending physician.  Recommendations may be updated based on patient status, additional functional criteria and insurance authorization.  Follow Up Recommendations  Home health PT     Assistance Recommended at Discharge Intermittent Supervision/Assistance  Patient can return home with the following A little help with walking and/or transfers;A little help with bathing/dressing/bathroom;Direct supervision/assist for medications management;Help with stairs or ramp for entrance   Equipment Recommendations  None recommended by PT    Recommendations for Other Services       Precautions / Restrictions Precautions Precautions: Fall Precaution Comments: watch HR/O2, on 2L O2 for  mobility Restrictions Weight Bearing Restrictions: No     Mobility  Bed Mobility Overal bed mobility: Needs Assistance Bed Mobility: Supine to Sit     Supine to sit: Modified independent (Device/Increase time)     General bed mobility comments: pt sat up on his own just prior to PTA arriving to room and reports need for urinal. Pt needing increased time to urinate seated EOB, staff left to obtain chair alarm and pt set off bed alarm again within 2 minutes although call bell use reviewed with him x2 prior to therapist leaving room.    Transfers Overall transfer level: Needs assistance Equipment used: Straight cane Transfers: Sit to/from Stand Sit to Stand: Supervision           General transfer comment: for safety, cues to wait for PT to be present as pt attempting to stand before PT present/ready to assist if needed. STS from EOB and from toilet heights with cane    Ambulation/Gait Ambulation/Gait assistance: Min guard Gait Distance (Feet): 15 Feet (53ft, then 86ft with seated break in bathroom) Assistive device: Straight cane Gait Pattern/deviations: Wide base of support, Shuffle, Step-through pattern, Trunk flexed       General Gait Details: pt deferring longer gait trial after assisted to/from bathroom and chair, also pt remains lethargic throughout. Pt needs cues for line awareness and impulsive to get up although he was instructed to notify staff prior to standing.      Balance Overall balance assessment: Needs assistance Sitting-balance support: No upper extremity supported, Feet supported Sitting balance-Leahy Scale: Good Sitting balance - Comments: pt drowsy and needing supervision for safety as he tends to fall asleep while seated EOB and remains a high fall risk; pt dropped urinal  while using it due to drowsiness   Standing balance support: Single extremity supported Standing balance-Leahy Scale: Fair Standing balance comment: no LOB for short distance in  room with cane                            Cognition Arousal/Alertness: Lethargic Behavior During Therapy: WFL for tasks assessed/performed Overall Cognitive Status: Impaired/Different from baseline Area of Impairment: Memory, Safety/judgement, Awareness, Problem solving                     Memory: Decreased short-term memory, Decreased recall of precautions Following Commands: Follows one step commands with increased time, Follows one step commands inconsistently Safety/Judgement: Decreased awareness of safety, Decreased awareness of deficits Awareness: Intellectual Problem Solving: Requires tactile cues, Requires verbal cues, Difficulty sequencing, Slow processing General Comments: pt requires cues to watch for lines/leads, some impulsivity and safety cueing needed during mobility, pt frequently ignoring safety cues; pt falling asleep while seated EOB and reports he dropped his urinal due to this. Pt not using call bell, he set his bed alarm off twice in 5 minutes.        Exercises General Exercises - Lower Extremity Ankle Circles/Pumps: AROM, Both, 10 reps, Seated Long Arc Quad: AROM, Both, 5 reps, Seated (limited participation)    General Comments General comments (skin integrity, edema, etc.): SpO2 95-100% on 2L O2 [place] with mobility; SpO2 noted to have been removed by pt prior to staff arrival to room and SpO2 desat with initial standing transfer to 85% on RA so O2 [place] replaced and >95% within 1 minute      Pertinent Vitals/Pain Pain Assessment Pain Assessment: No/denies pain Faces Pain Scale: No hurt Pain Intervention(s): Limited activity within patient's tolerance, Monitored during session, Repositioned        PT Goals (current goals can now be found in the care plan section) Acute Rehab PT Goals Patient Stated Goal: return home PT Goal Formulation: With patient Time For Goal Achievement: 09/06/21 Progress towards PT goals: Progressing toward goals     Frequency    Min 3X/week      PT Plan Current plan remains appropriate       AM-PAC PT "6 Clicks" Mobility   Outcome Measure  Help needed turning from your back to your side while in a flat bed without using bedrails?: None Help needed moving from lying on your back to sitting on the side of a flat bed without using bedrails?: None Help needed moving to and from a bed to a chair (including a wheelchair)?: A Little Help needed standing up from a chair using your arms (e.g., wheelchair or bedside chair)?: A Little Help needed to walk in hospital room?: A Lot (mod safety cues due to pt impulsivity) Help needed climbing 3-5 steps with a railing? : A Lot (anticipate increased cues/assist) 6 Click Score: 18    End of Session Equipment Utilized During Treatment: Oxygen (pt not allowing time to don gait belt -too urgent to get to toilet) Activity Tolerance: Patient limited by lethargy;Patient limited by fatigue Patient left: in chair;with call bell/phone within reach;with chair alarm set;Other (comment) (in recliner, BLE elevated) Nurse Communication: Mobility status;Other (comment) (pt poor safety, chair alarm placed for safety) PT Visit Diagnosis: Unsteadiness on feet (R26.81);Other abnormalities of gait and mobility (R26.89);Muscle weakness (generalized) (M62.81)     Time: 9833-8250 PT Time Calculation (min) (ACUTE ONLY): 30 min  Charges:  $Gait Training: 8-22 mins $  Therapeutic Activity: 8-22 mins                     Chandni Gagan P., PTA Acute Rehabilitation Services Pager: [phone] Office: [place] 08/27/2021, 3:26 PM

## 2021-08-27 NOTE — Progress Notes (Signed)
Pt refused Bipap for tonight 

## 2021-08-27 NOTE — Progress Notes (Signed)
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 He has been noncompliant with medications.   Had run out of his lasix, eliquis, amiodarone,  He presented with worsening shortness of breath and leg edema. 5.1 liters so far this admission  He is on Lasix 120 mg IV twice a day. Creatinine is 1.87   Inpatient Medications    Scheduled Meds:  acetaZOLAMIDE  500 mg Intravenous Once   amiodarone  400 mg Oral BID   apixaban  5 mg Oral BID   atorvastatin  80 mg Oral Daily   buprenorphine-naloxone  1 tablet Sublingual BID   clopidogrel  75 mg Oral Daily   diclofenac Sodium  4 g Topical QID   fluticasone furoate-vilanterol  1 puff Inhalation Daily   umeclidinium bromide  1 puff Inhalation Daily   Continuous Infusions:  sodium chloride Stopped (08/23/21 0149)   furosemide 120 mg (08/27/21 0943)   PRN Meds: sodium chloride, acetaminophen **OR** acetaminophen, albuterol, benzonatate, diclofenac Sodium, Muscle Rub, senna-docusate   Vital Signs    Vitals:   08/27/21 0550 08/27/21 0800 08/27/21 0812 08/27/21 0843  BP: 115/77  112/62   Pulse: (!) 117  89   Resp: 20  16   Temp: (!) 97.5 F (36.4 C)     TempSrc: Oral     SpO2: 98%  99% 99%  Weight: 97.3 kg 97.9 kg    Height:        Intake/Output Summary (Last 24 hours) at 08/27/2021 1047 Last data filed at 08/27/2021 0943 Gross per 24 hour  Intake 1002 ml  Output 2516 ml  Net -1514 ml    Last 3 Weights 08/27/2021 08/27/2021 08/26/2021  Weight (lbs) 215 lb 13.3 oz 214 lb 6.4 oz 202 lb  Weight (kg) 97.9 kg 97.251 kg 91.627 kg      Telemetry    Afib at 100-105- Personally Reviewed  ECG      - Personally Reviewed  Physical Exam   Physical Exam: Blood pressure 112/62, pulse 89,  temperature (!) 97.5 F (36.4 C), temperature source Oral, resp. rate 16, height 5\' 6"  (1.676 m), weight 97.9 kg, SpO2 99 %.  GEN:  middle age, moderately obese male  HEENT: Normal NECK: No JVD; No carotid bruits LYMPHATICS: No lymphadenopathy CARDIAC: RRR , no murmurs, rubs, gallops RESPIRATORY:  Clear to auscultation without rales, wheezing or rhonchi  ABDOMEN: Soft, non-tender, non-distended MUSCULOSKELETAL:  No edema; No deformity  SKIN: Warm and dry NEUROLOGIC:  Alert and oriented x 3   Labs    High Sensitivity Troponin:   Recent Labs  Lab 08/22/21 0241 08/22/21 0455  TROPONINIHS 30* 31*      Chemistry Recent Labs  Lab 08/22/21 0241 08/22/21 0241 08/23/21 0315 08/24/21 0319 08/25/21 0217 08/26/21 0336 08/27/21 0219  NA 142  --  138 135 135 136 136  K 3.4*  --  4.7 4.2 4.0 3.8 4.0  CL 106  --  102 99 97* 95* 94*  CO2 23  --  21* 22 25 28 31   GLUCOSE 119*  --  111* 135* 99 74 115*  BUN 28*  --  40* 45* 44* 44* 46*  CREATININE 1.54*  --  2.18* 2.07* 1.78* 1.69* 1.87*  CALCIUM 8.5*  --  8.6* 8.3* 8.0* 8.4* 8.3*  MG  --    < > 2.1 1.8 1.8  --  2.4  PROT 7.2  --  7.2  --   --   --   --   ALBUMIN 3.1*  --  3.1*  --   --   --   --   AST 29  --  32  --   --   --   --   ALT 23  --  32  --   --   --   --   ALKPHOS 152*  --  173*  --   --   --   --   BILITOT 0.7  --  0.7  --   --   --   --   GFRNONAA 47*  --  31* 33* 40* 42* 37*  ANIONGAP 13  --  15 14 13 13 11    < > = values in this interval not displayed.     Lipids  Recent Labs  Lab 08/23/21 0315  CHOL 71  TRIG 66  HDL 27*  LDLCALC 31  CHOLHDL 2.6     Hematology Recent Labs  Lab 08/25/21 0217 08/26/21 0336 08/27/21 0219  WBC 12.9* 11.3* 12.0*  RBC 4.79 4.95 4.77  HGB 14.8 15.1 14.6  HCT 45.2 46.8 44.7  MCV 94.4 94.5 93.7  MCH 30.9 30.5 30.6  MCHC 32.7 32.3 32.7  RDW 15.2 15.2 14.8  PLT 225 222 242    Thyroid  Recent Labs  Lab 08/22/21 1516  TSH 1.462     BNP Recent Labs  Lab  08/22/21 0241  BNP 953.7*     DDimer No results for input(s): DDIMER in the last 168 hours.   Radiology    No results found.  Cardiac Studies      Patient Profile     76 y.o. male with atrial fib, CHF , CAD   Assessment & Plan    Acute on chronic combined systolic and diastolic congestive heart failure: Has diuresed 5 liters.   Creatinine is up slightly .  He still has leg edema but its because is has not been elevating his feel.   He has become more tachycardic.  Lets try Lasix 80 mg a day , elevated his legs throughout the day , knee high compression hose. and see if we can continue to diurese. I've also reminded him to wear his CPAP at night     2.   Atrial fib:   rate is slightly tachy this am     3.   CAD :   no angina     4.  Hyperlipidemia :  cont atorvastatin    For questions or updates, please contact [place] Please consult [email or web address] for contact info under        Signed, Mertie Moores, MD  08/27/2021, 10:47 AM

## 2021-08-27 NOTE — Progress Notes (Addendum)
°  Subjective: NAOE  Patient was seen at bedside during rounds today. Pt reports feeling well. He has noticed improvement in [facility] and leg swelling. He denies chest pain and abdominal pain. No questions or concerns. Pt is updated on the plan for today.  Objective:  Vital signs in last 24 hours: Vitals:   08/27/21 0800 08/27/21 0812 08/27/21 0843 08/27/21 1124  BP:  112/62  103/72  Pulse:  89  89  Resp:  16  16  Temp:    (!) 97.4 F (36.3 C)  TempSrc:    Oral  SpO2:  99% 99% 96%  Weight: 97.9 kg     Height:       Constitutional: alert, well-appearing, in NAD HENT: normocephalic, atraumatic, mucous membranes moist Cardiovascular: IRIR, no m/r/g, 2+ bilateral LE up to knee Pulmonary/Chest: mildly labored work of breathing with supplemental O2, LCTAB  MSK: knees are not warm to touch, there is some erythema overlying left knee Abdominal: soft, non-tender to palpation, non-distended Neurological: A&O x 3 Skin: warm and dry  Assessment/Plan:  Principal Problem:   Acute on chronic heart failure (HCC) Active Problems:   Atrial fibrillation (HCC)   Tobacco abuse   Polycythemia vera (HCC)   COPD (chronic obstructive pulmonary disease) (HCC)   Opioid use disorder    #Acute HFrEF exacerbation   #Hx of combined HF (EF 40-45% 09/2020) #Hx of medication nonadherence Hypervolemic on exam with mildly labored breathing on 3L. Has ongoing LE edema, but diuresed only 1/2L yesterday, I think we are reaching out limit. Cr bumped from 1.9 to 2.2 (baseline 1.6) with IV Lasix use; has already received today's dose, will hold starting tmrw.  -Cardiology following, appreciate assistance   -s/p IV Lasix 80 mg this morning -Hold any further lasix  -Trend BMP -GDMT limited by BP -Strict ins and outs and daily weights -Wean supplemental O2 as tolerated    #Paroxysmal Atrial Fibrillation/Flutter #Hx of A flutter in 2012 s/p ablation in 2012 #Medication nonadherence rates improved 85's. BP  limiting BB use; plan to start low dose metop tmrw per cards.  -Cardiology following, appreciate assistance  -Continue PO amio -Continue Eliquis 5 mg -Possible TEE/DCCV this admission    #CKD stage III a #Hypokalemia Cr bump from 1.7 -> 1.9 -> 2.2 (today) in s/o ongoing IV diuresis (baseline 1.54).   -Hold Lasix  -Trend BMP -Replete electrolytes as needed  #Left knee pain, resolved  Suspect 2/2 ongoing fluid overload. Improved with Voltaren gel.  - Voltaren gel - Continue to diurese    #Coronary Artery Disease s/p LAD DES in March 2022 #PAD -Continue Plavix 75 mg daily -Restarted Eliquis 5 mg daily -Continue Lipitor 40 mg daily -Follow up with cardiology as an outpatient    #Polysubstance abuse -Continue Suboxone 8-2 mg bid  -Monitor for signs of withdrawal   #COPD -Dulera daily -Albuterol as needed   #OSA -nightly CPAP   #Prediabetes A1c 6.0% this admission   Best Practice: Diet: Heart Healthy IVF: None,None VTE: Eliquis  Code: Full   [name], MD  Internal Medicine Resident, PGY-1 Pager: [phone] After 5pm on weekdays and 1pm on weekends: On Call pager [phone]

## 2021-08-28 ENCOUNTER — Other Ambulatory Visit (HOSPITAL_COMMUNITY): Payer: Self-pay

## 2021-08-28 DIAGNOSIS — I5043 Acute on chronic combined systolic (congestive) and diastolic (congestive) heart failure: Secondary | ICD-10-CM | POA: Diagnosis not present

## 2021-08-28 LAB — CBC
HCT: 44.5 % (ref 39.0–52.0)
Hemoglobin: 14.1 g/dL (ref 13.0–17.0)
MCH: 30.1 pg (ref 26.0–34.0)
MCHC: 31.7 g/dL (ref 30.0–36.0)
MCV: 94.9 fL (ref 80.0–100.0)
Platelets: 234 10*3/uL (ref 150–400)
RBC: 4.69 MIL/uL (ref 4.22–5.81)
RDW: 15.3 % (ref 11.5–15.5)
WBC: 10.1 10*3/uL (ref 4.0–10.5)
nRBC: 0 % (ref 0.0–0.2)

## 2021-08-28 LAB — BASIC METABOLIC PANEL
Anion gap: 12 (ref 5–15)
BUN: 48 mg/dL — ABNORMAL HIGH (ref 8–23)
CO2: 30 mmol/L (ref 22–32)
Calcium: 8.5 mg/dL — ABNORMAL LOW (ref 8.9–10.3)
Chloride: 94 mmol/L — ABNORMAL LOW (ref 98–111)
Creatinine, Ser: 2.21 mg/dL — ABNORMAL HIGH (ref 0.61–1.24)
GFR, Estimated: 30 mL/min — ABNORMAL LOW (ref >60)
Glucose, Bld: 88 mg/dL (ref 70–99)
Potassium: 4 mmol/L (ref 3.5–5.1)
Sodium: 136 mmol/L (ref 135–145)

## 2021-08-28 NOTE — Progress Notes (Signed)
Occupational Therapy Treatment Patient Details Name: [name] MRN: [id number] DOB: 1945-08-08 Today's Date: 08/28/2021   History of present illness 76 yo admitted 2/3 with SOB and CHF, pt reports non-compliance with medications. PMhx: CHF, COPD, AFib, NSTEMI, NICM, dermatitis, PAD, HLD, tobacco and ETOH use.   OT comments  Patient up in recliner and alert. Patient used straight cane for mobility and transfers with min guard for safety. Patient demonstrated increased standing tolerance during self care. Discussed energy conservation strategies and patient was unable to recall.  Discussed further and patient was able to recite 2/3 strategies. Acute OT to continue to follow.    Recommendations for follow up therapy are one component of a multi-disciplinary discharge planning process, led by the attending physician.  Recommendations may be updated based on patient status, additional functional criteria and insurance authorization.    Follow Up Recommendations  Home health OT    Assistance Recommended at Discharge Frequent or constant Supervision/Assistance  Patient can return home with the following  A little help with walking and/or transfers;A little help with bathing/dressing/bathroom;Direct supervision/assist for medications management   Equipment Recommendations  Tub/shower bench    Recommendations for Other Services      Precautions / Restrictions Precautions Precautions: Fall Precaution Comments: watch HR/O2, on 2L O2 for mobility       Mobility Bed Mobility Overal bed mobility: Needs Assistance             General bed mobility comments: up in recliner upon arrival and at end of session    Transfers Overall transfer level: Needs assistance Equipment used: Straight cane Transfers: Sit to/from Stand Sit to Stand: Min guard           General transfer comment: min guard for mobility and transers with straight cane     Balance Overall balance  assessment: Needs assistance Sitting-balance support: No upper extremity supported, Feet supported Sitting balance-Leahy Scale: Good     Standing balance support: Single extremity supported Standing balance-Leahy Scale: Fair Standing balance comment: stood at sink for self care with support of sink                           ADL either performed or assessed with clinical judgement   ADL Overall ADL's : Needs assistance/impaired     Grooming: Wash/dry hands;Wash/dry face;Oral care;Standing;Min guard Grooming Details (indicate cue type and reason): performed standing at sink Upper Body Bathing: Min guard;Standing Upper Body Bathing Details (indicate cue type and reason): performed standing at sink Lower Body Bathing: Min guard;Sit to/from stand Lower Body Bathing Details (indicate cue type and reason): bathed peri area standing at sink Upper Body Dressing : Set up;Sitting Upper Body Dressing Details (indicate cue type and reason): changed gown     Toilet Transfer: Min guard Toilet Transfer Details (indicate cue type and reason): ambulated to bathroom with cane and min guard assistt Toileting- Clothing Manipulation and Hygiene: Supervision/safety;Sitting/lateral lean         General ADL Comments: performed grooming, UB bathing and peri area cleaning standing at sink. Patient changed gown seated due to fatigue    Extremity/Trunk Assessment              Vision       Perception     Praxis      Cognition Arousal/Alertness: Awake/alert Behavior During Therapy: WFL for tasks assessed/performed Overall Cognitive Status: Impaired/Different from baseline Area of Impairment: Memory, Safety/judgement, Awareness, Problem solving  Memory: Decreased short-term memory, Decreased recall of precautions Following Commands: Follows one step commands with increased time, Follows one step commands inconsistently Safety/Judgement: Decreased  awareness of safety, Decreased awareness of deficits Awareness: Intellectual Problem Solving: Requires tactile cues, Requires verbal cues, Difficulty sequencing, Slow processing General Comments: not oriented to date and time, unable to recall energy conservation strategies        Exercises      Shoulder Instructions       General Comments      Pertinent Vitals/ Pain       Pain Assessment Pain Assessment: No/denies pain Pain Intervention(s): Monitored during session  Home Living                                          Prior Functioning/Environment              Frequency  Min 2X/week        Progress Toward Goals  OT Goals(current goals can now be found in the care plan section)  Progress towards OT goals: Progressing toward goals  Acute Rehab OT Goals Patient Stated Goal: go home OT Goal Formulation: With patient Time For Goal Achievement: 09/05/21 Potential to Achieve Goals: Fair ADL Goals Pt Will Perform Grooming: with supervision;standing Pt Will Perform Lower Body Dressing: with supervision;sit to/from stand Pt Will Transfer to Toilet: with supervision;ambulating Additional ADL Goal #1: Pt will demonstrate independence with 3 energy conservation strategies during ADL/functional mobility.  Plan Discharge plan remains appropriate    Co-evaluation                 AM-PAC OT "6 Clicks" Daily Activity     Outcome Measure   Help from another person eating meals?: None Help from another person taking care of personal grooming?: A Little Help from another person toileting, which includes using toliet, bedpan, or urinal?: A Little Help from another person bathing (including washing, rinsing, drying)?: A Little Help from another person to put on and taking off regular upper body clothing?: A Little Help from another person to put on and taking off regular lower body clothing?: A Little 6 Click Score: 19    End of Session Equipment  Utilized During Treatment: Oxygen;Other (comment) (straight cane)  OT Visit Diagnosis: Other abnormalities of gait and mobility (R26.89);Muscle weakness (generalized) (M62.81);Other symptoms and signs involving cognitive function   Activity Tolerance Patient tolerated treatment well   Patient Left in chair;with call bell/phone within reach;with chair alarm set   Nurse Communication Mobility status        Time: 4920-1007 OT Time Calculation (min): 24 min  Charges: OT General Charges $OT Visit: 1 Visit OT Treatments $Self Care/Home Management : 23-37 mins  Lodema Hong, [place]  Pager [phone] Office [place] 08/28/2021, 10:39 AM

## 2021-08-28 NOTE — Progress Notes (Signed)
Pt refused BiPAP for tonight, states he will call if he wants to wear it.

## 2021-08-28 NOTE — Progress Notes (Signed)
Patient's wife had concerns about current plan of care. Patient's wife Colin Mulders mobile phone number is [phone] and home phone number is [phone]. MD paged and notified.

## 2021-08-28 NOTE — Progress Notes (Addendum)
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 He has been noncompliant with medications.   Had run out of his lasix, eliquis, amiodarone,  He presented with worsening shortness of breath and leg edema. 4.5  liters so far this admission  He is on Lasix 120 mg IV twice a day. Creatinine is 2.21 today  Hold lasix tomorrow   He is not on a beta blocker.  Will need to try to start a low dose beta blocker when his bp improves.  He is intravascularly depleted today . Perhaps we can add in low dose metoprolol in 1-2 days  Inpatient Medications    Scheduled Meds:  amiodarone  400 mg Oral BID   apixaban  5 mg Oral BID   atorvastatin  80 mg Oral Daily   buprenorphine-naloxone  1 tablet Sublingual BID   clopidogrel  75 mg Oral Daily   diclofenac Sodium  4 g Topical QID   fluticasone furoate-vilanterol  1 puff Inhalation Daily   furosemide  80 mg Intravenous Daily   umeclidinium bromide  1 puff Inhalation Daily   Continuous Infusions:  sodium chloride Stopped (08/23/21 0149)   PRN Meds: sodium chloride, acetaminophen **OR** acetaminophen, albuterol, benzonatate, diclofenac Sodium, Muscle Rub, senna-docusate   Vital Signs    Vitals:   08/27/21 1946 08/28/21 0632 08/28/21 0735 08/28/21 0800  BP: (!) 96/59 (!) 100/54  108/75  Pulse: 79 85  90  Resp: 16 17  16   Temp: (!) 97.5 F (36.4 C) 97.6 F (36.4 C)  97.6 F (36.4 C)  TempSrc: Oral Oral  Oral  SpO2: 96% 98% (!) 1% 95%  Weight:  98.2 kg    Height:        Intake/Output Summary (Last 24 hours) at 08/28/2021 1051 Last data filed at 08/28/2021 0917 Gross per 24 hour  Intake 1556 ml  Output 950 ml  Net 606 ml    Last 3 Weights 08/28/2021 08/27/2021 08/27/2021  Weight (lbs) 216 lb  7.9 oz 215 lb 13.3 oz 214 lb 6.4 oz  Weight (kg) 98.2 kg 97.9 kg 97.251 kg      Telemetry    Afib at 100-105- Personally Reviewed  ECG      - Personally Reviewed  Physical Exam  Physical Exam: Blood pressure 108/75, pulse 90, temperature 97.6 F (36.4 C), temperature source Oral, resp. rate 16, height 5\' 6"  (1.676 m), weight 98.2 kg, SpO2 95 %.  GEN:  moderately obese,  NAD  HEENT: Normal NECK: No JVD; No carotid bruits LYMPHATICS: No lymphadenopathy CARDIAC: RRR , no murmurs, rubs, gallops RESPIRATORY:  Clear to auscultation without rales, wheezing or rhonchi  ABDOMEN: Soft, non-tender, non-distended MUSCULOSKELETAL:  1-2+ edema in B legs No deformity  SKIN: Warm and dry NEUROLOGIC:  Alert and oriented x 3    Labs    High Sensitivity Troponin:   Recent Labs  Lab 08/22/21 0241 08/22/21 0455  TROPONINIHS 30* 31*      Chemistry Recent Labs  Lab 08/22/21 0241 08/22/21 0241 08/23/21 0315 08/24/21 0319 08/25/21 0217 08/26/21 0336 08/27/21 0219 08/28/21 0129  NA 142  --  138 135 135 136 136 136  K 3.4*  --  4.7  4.2 4.0 3.8 4.0 4.0  CL 106  --  102 99 97* 95* 94* 94*  CO2 23  --  21* 22 25 28 31 30   GLUCOSE 119*  --  111* 135* 99 74 115* 88  BUN 28*  --  40* 45* 44* 44* 46* 48*  CREATININE 1.54*  --  2.18* 2.07* 1.78* 1.69* 1.87* 2.21*  CALCIUM 8.5*  --  8.6* 8.3* 8.0* 8.4* 8.3* 8.5*  MG  --    < > 2.1 1.8 1.8  --  2.4  --   PROT 7.2  --  7.2  --   --   --   --   --   ALBUMIN 3.1*  --  3.1*  --   --   --   --   --   AST 29  --  32  --   --   --   --   --   ALT 23  --  32  --   --   --   --   --   ALKPHOS 152*  --  173*  --   --   --   --   --   BILITOT 0.7  --  0.7  --   --   --   --   --   GFRNONAA 47*  --  31* 33* 40* 42* 37* 30*  ANIONGAP 13  --  15 14 13 13 11 12    < > = values in this interval not displayed.     Lipids  Recent Labs  Lab 08/23/21 0315  CHOL 71  TRIG 66  HDL 27*  LDLCALC 31  CHOLHDL 2.6     Hematology Recent Labs  Lab  08/26/21 0336 08/27/21 0219 08/28/21 0129  WBC 11.3* 12.0* 10.1  RBC 4.95 4.77 4.69  HGB 15.1 14.6 14.1  HCT 46.8 44.7 44.5  MCV 94.5 93.7 94.9  MCH 30.5 30.6 30.1  MCHC 32.3 32.7 31.7  RDW 15.2 14.8 15.3  PLT 222 242 234    Thyroid  Recent Labs  Lab 08/22/21 1516  TSH 1.462     BNP Recent Labs  Lab 08/22/21 0241  BNP 953.7*     DDimer No results for input(s): DDIMER in the last 168 hours.   Radiology    No results found.  Cardiac Studies      Patient Profile     76 y.o. male with atrial fib, CHF , CAD   Assessment & Plan    Acute on chronic combined systolic and diastolic congestive heart failure: Creatinine is up slighlty.  Will hold lasix tomorrew ( has already received it today )  Will try to add in beta block in the next day or.   His BP is marginal at this point  Cont other meds.     2.   Atrial fib:   cont med.s   3.   CAD :   no angina     4.  Hyperlipidemia :  cont atorvastatin    For questions or updates, please contact [place] Please consult [email or web address] for contact info under        Signed, Mertie Moores, MD  08/28/2021, 10:51 AM

## 2021-08-28 NOTE — Progress Notes (Addendum)
°  Subjective: NAOE  Patient was seen at bedside during rounds today. Pt reports feeling well except for some SHOB last night which has now resolved. He has noticed improvement in leg swelling. He denies chest pain and abdominal pain. Gave me permission to speak with wife.   No questions or concerns. Pt is updated on the plan for today.  Objective:  Vital signs in last 24 hours: Vitals:   08/28/21 0800 08/28/21 1035 08/28/21 1200 08/28/21 1420  BP: 108/75 101/73 108/66 110/67  Pulse: 90 92 96 88  Resp: 16 14 16 16   Temp: 97.6 F (36.4 C) (!) 97.4 F (36.3 C) 97.6 F (36.4 C)   TempSrc: Oral  Axillary   SpO2: 95% 95% 97% 96%  Weight:      Height:       Constitutional: alert, well-appearing, in NAD HENT: normocephalic, atraumatic, mucous membranes moist Cardiovascular: IRIR, no m/r/g, 2+ bilateral LE up to knee Pulmonary/Chest: mildly labored work of breathing with supplemental O2, LCTAB  MSK: knees are not warm to touch, there is some erythema overlying left knee Abdominal: soft, non-tender to palpation, non-distended Neurological: A&O x 3 Skin: warm and dry  Assessment/Plan:  Principal Problem:   Acute on chronic heart failure (HCC) Active Problems:   Atrial fibrillation (HCC)   Tobacco abuse   Polycythemia vera (HCC)   COPD (chronic obstructive pulmonary disease) (HCC)   Opioid use disorder    #Acute HFrEF exacerbation   #Hx of combined HF (EF 40-45% 09/2020) #Hx of medication nonadherence Hypervolemic on exam with mildly labored breathing on 3L. Diuresed 1.8L yesterday, has ongoing LE edema. I think we are reaching our limit. LE edema near baseline.  -Cardiology following, appreciate assistance   -Hold any further lasix  -Trend BMP -GDMT limited by BP -Strict ins and outs and daily weights -Wean supplemental O2 as tolerated    #Paroxysmal Atrial Fibrillation/Flutter #Hx of A flutter in 2012 s/p ablation in 2012 #Medication nonadherence Rates 85-90's. Cards  was considering starting BB today, but soft BP may not allow.  -Cardiology following, appreciate assistance  -Continue PO amio  -Continue Eliquis 5 mg -Possible TEE/DCCV this admission    #CKD stage III a #Hypokalemia Cr bump from 1.7 -> 1.9 -> 2.2 -> 2.29 (today) in s/o ongoing IV diuresis (baseline 1.54). Holding Lasix starting today.  -Hold Lasix  -Trend BMP -Replete electrolytes as needed  #Left knee pain, resolved  Suspect 2/2 ongoing fluid overload. Improved with Voltaren gel.  - Voltaren gel - Continue to diurese    #Coronary Artery Disease s/p LAD DES in March 2022 #PAD -Continue Plavix 75 mg daily -Restarted Eliquis 5 mg daily -Continue Lipitor 40 mg daily -Follow up with cardiology as an outpatient    #Polysubstance abuse -Continue Suboxone 8-2 mg bid  -Monitor for signs of withdrawal   #COPD -Dulera daily -Albuterol as needed   #OSA -nightly CPAP   #Prediabetes A1c 6.0% this admission   Best Practice: Diet: Heart Healthy IVF: None,None VTE: Eliquis  Code: Full   [name], MD  Internal Medicine Resident, PGY-1 Pager: [phone] After 5pm on weekdays and 1pm on weekends: On Call pager [phone]

## 2021-08-28 NOTE — Progress Notes (Signed)
Mobility Specialist Progress Note:   08/28/21 1200  Mobility  Activity Ambulated with assistance in hallway  Level of Assistance Contact guard assist, steadying assist  Assistive Device Cane  Distance Ambulated (ft) 50 ft  Activity Response Tolerated well  $Mobility charge 1 Mobility   Pt eager for hallway ambulation today. Required CGA for safety, no physical assist otherwise. Distance limited secondary to fatigue. Pt back sitting EOB with all needs met.   [name] Mobility Specialist  Phone [phone]

## 2021-08-28 NOTE — TOC Benefit Eligibility Note (Signed)
Patient Teacher, English as a foreign language completed.    The patient is currently admitted and upon discharge could be taking Farxiga 10 mg.  The current 30 day co-pay is, $4.30.   The patient is currently admitted and upon discharge could be taking Jardiance 10 mg.  The current 30 day co-pay is, $4.30.  The patient is insured through [place], [place] Patient Advocate Specialist [place] Patient Advocate Team Direct Number: [phone]  Fax: [phone]

## 2021-08-28 NOTE — Plan of Care (Signed)
?  Problem: Clinical Measurements: ?Goal: Respiratory complications will improve ?Outcome: Progressing ?  ?Problem: Elimination: ?Goal: Will not experience complications related to urinary retention ?Outcome: Progressing ?  ?

## 2021-08-28 NOTE — Progress Notes (Signed)
Patient wanted to keep on ted hose on and did not want to take them off to give the legs a rest. RN provided education and patient is aware.

## 2021-08-29 DIAGNOSIS — I5043 Acute on chronic combined systolic (congestive) and diastolic (congestive) heart failure: Secondary | ICD-10-CM | POA: Diagnosis not present

## 2021-08-29 LAB — BASIC METABOLIC PANEL
Anion gap: 11 (ref 5–15)
BUN: 53 mg/dL — ABNORMAL HIGH (ref 8–23)
CO2: 27 mmol/L (ref 22–32)
Calcium: 8.6 mg/dL — ABNORMAL LOW (ref 8.9–10.3)
Chloride: 97 mmol/L — ABNORMAL LOW (ref 98–111)
Creatinine, Ser: 2.29 mg/dL — ABNORMAL HIGH (ref 0.61–1.24)
GFR, Estimated: 29 mL/min — ABNORMAL LOW (ref >60)
Glucose, Bld: 99 mg/dL (ref 70–99)
Potassium: 4.2 mmol/L (ref 3.5–5.1)
Sodium: 135 mmol/L (ref 135–145)

## 2021-08-29 LAB — CBC
HCT: 43.6 % (ref 39.0–52.0)
Hemoglobin: 14.4 g/dL (ref 13.0–17.0)
MCH: 30.6 pg (ref 26.0–34.0)
MCHC: 33 g/dL (ref 30.0–36.0)
MCV: 92.6 fL (ref 80.0–100.0)
Platelets: 239 10*3/uL (ref 150–400)
RBC: 4.71 MIL/uL (ref 4.22–5.81)
RDW: 15.1 % (ref 11.5–15.5)
WBC: 10.8 10*3/uL — ABNORMAL HIGH (ref 4.0–10.5)
nRBC: 0 % (ref 0.0–0.2)

## 2021-08-29 LAB — MAGNESIUM: Magnesium: 2.5 mg/dL — ABNORMAL HIGH (ref 1.7–2.4)

## 2021-08-29 NOTE — Progress Notes (Signed)
Physical Therapy Treatment Patient Details Name: [name] MRN: [id number] DOB: 06-Apr-1946 Today's Date: 08/29/2021   History of Present Illness 76 yo admitted 2/3 with SOB and CHF, pt reports non-compliance with medications. PMhx: CHF, COPD, AFib, NSTEMI, NICM, dermatitis, PAD, HLD, tobacco and ETOH use.    PT Comments    Pt sitting EOB and repeatedly nodding/falling forward while sitting EOB upon my arrival. Pt awakes easily to verbal cues and initially agreed to session of mobility, however, pt unable to maintain alertness and suddenly reported "I am sleepy, I'm laying down" and then transitioned to supine. Pt denied pain or dizziness, but BP taken was 98/66 and then 92/52. Will continue to follow and attempt to progress OOB mobility and balance as tolerated by pt when more alert.    Recommendations for follow up therapy are one component of a multi-disciplinary discharge planning process, led by the attending physician.  Recommendations may be updated based on patient status, additional functional criteria and insurance authorization.  Follow Up Recommendations  Home health PT     Assistance Recommended at Discharge Intermittent Supervision/Assistance  Patient can return home with the following A little help with walking and/or transfers;A little help with bathing/dressing/bathroom;Direct supervision/assist for medications management;Help with stairs or ramp for entrance   Equipment Recommendations  None recommended by PT    Recommendations for Other Services       Precautions / Restrictions Precautions Precautions: Fall Precaution Comments: watch HR/O2, on 2L O2 for mobility Restrictions Weight Bearing Restrictions: No     Mobility  Bed Mobility Overal bed mobility: Needs Assistance Bed Mobility: Sit to Supine     Supine to sit: Modified independent (Device/Increase time) Sit to supine: Modified independent (Device/Increase time)   General bed mobility  comments: pt sitting EOB upon arrival, able to return to supine without assist    Transfers                   General transfer comment: pt initially agreeable then declined due to elthargy       Balance Overall balance assessment: Needs assistance Sitting-balance support: No upper extremity supported, Feet supported Sitting balance-Leahy Scale: Poor Sitting balance - Comments: pt drowsy and repeatedly falling forward onto bedside table upon my arrival, awoke easilyy to verbal cues but then declined further mobility.                                    Cognition Arousal/Alertness: Lethargic Behavior During Therapy: Flat affect Overall Cognitive Status: Impaired/Different from baseline Area of Impairment: Memory, Safety/judgement, Awareness, Problem solving                     Memory: Decreased short-term memory, Decreased recall of precautions Following Commands: Follows one step commands with increased time, Follows one step commands inconsistently Safety/Judgement: Decreased awareness of safety, Decreased awareness of deficits     General Comments: not oriented to date and time, unable to recall energy conservation strategies        Exercises      General Comments General comments (skin integrity, edema, etc.): pt very drowsy at this time, falling alseep and falling forward at EOB. BP soft 98/66 then 92/52, RN alerted      Pertinent Vitals/Pain Pain Assessment Pain Assessment: No/denies pain Faces Pain Scale: No hurt Pain Intervention(s): Monitored during session     PT Goals (current goals can now be found  in the care plan section) Acute Rehab PT Goals Patient Stated Goal: return home PT Goal Formulation: With patient Time For Goal Achievement: 09/06/21 Potential to Achieve Goals: Good Progress towards PT goals: Not progressing toward goals - comment (lethargy)    Frequency    Min 3X/week      PT Plan Current plan remains  appropriate       AM-PAC PT "6 Clicks" Mobility   Outcome Measure  Help needed turning from your back to your side while in a flat bed without using bedrails?: None Help needed moving from lying on your back to sitting on the side of a flat bed without using bedrails?: None Help needed moving to and from a bed to a chair (including a wheelchair)?: A Little Help needed standing up from a chair using your arms (e.g., wheelchair or bedside chair)?: A Little Help needed to walk in hospital room?: A Lot Help needed climbing 3-5 steps with a railing? : A Lot 6 Click Score: 18    End of Session Equipment Utilized During Treatment: Oxygen Activity Tolerance: Patient limited by lethargy Patient left: in bed;with call bell/phone within reach;with bed alarm set Nurse Communication: Mobility status (BP) PT Visit Diagnosis: Unsteadiness on feet (R26.81);Other abnormalities of gait and mobility (R26.89);Muscle weakness (generalized) (M62.81)     Time: 0272-5366 PT Time Calculation (min) (ACUTE ONLY): 17 min  Charges:  $Therapeutic Activity: 8-22 mins                     West Carbo, PT, DPT   Acute Rehabilitation Department Pager #: [phone]   Sandra Cockayne 08/29/2021, 2:49 PM

## 2021-08-29 NOTE — Plan of Care (Signed)
°  Problem: Activity: Goal: Risk for activity intolerance will decrease Outcome: Progressing   Problem: Safety: Goal: Ability to remain free from injury will improve Outcome: Progressing   Problem: Clinical Measurements: Goal: Diagnostic test results will improve Outcome: Not Progressing

## 2021-08-29 NOTE — Care Management Important Message (Signed)
Important Message  Patient Details  Name: Render Marley MRN: [id number] Date of Birth: 28-May-1946   Medicare Important Message Given:  Yes     [name] 08/29/2021, 10:08 AM

## 2021-08-29 NOTE — Plan of Care (Signed)
  Problem: Education: Goal: Knowledge of General Education information will improve Description: Including pain rating scale, medication(s)/side effects and non-pharmacologic comfort measures Outcome: Progressing   Problem: Clinical Measurements: Goal: Ability to maintain clinical measurements within normal limits will improve Outcome: Progressing   

## 2021-08-29 NOTE — Progress Notes (Signed)
Mobility Specialist Progress Note:   08/29/21 1015  Mobility  Activity Ambulated with assistance in hallway;Ambulated with assistance to bathroom  Level of Assistance Contact guard assist, steadying assist  Assistive Device Cane  Distance Ambulated (ft) 70 ft  Activity Response Tolerated well  $Mobility charge 1 Mobility   Pt agreeable to hallway ambulation this am. Received on 3LO2, SpO2 96% during ambulation, weaned to 2LO2 per RN, SpO2 of 98%. Distance limited secondary to fatigue/SOB. Pt left in BR, educated to pull string when done. Will f/u to get back.  [name] Mobility Specialist  Phone: [phone]

## 2021-08-29 NOTE — Progress Notes (Signed)
Progress Note  Patient Name: [name] Date of Encounter: 08/29/2021  [facility] Cardiologist: Sinclair Grooms, MD    Subjective    76 yo with dilated CM, EF 25% in 2012,  hx of PAF , he is on amio  EF normalized eventually , Then declined back to 40% by echo March 2022. He has been noncompliant with medications.   Had run out of his lasix, eliquis, amiodarone,  He presented with worsening shortness of breath and leg edema. 4.5  liters so far this admission  Creatinine continues to increase HR is well controlled.  BP is slightly low     Inpatient Medications    Scheduled Meds:  amiodarone  400 mg Oral BID   apixaban  5 mg Oral BID   atorvastatin  80 mg Oral Daily   buprenorphine-naloxone  1 tablet Sublingual BID   clopidogrel  75 mg Oral Daily   diclofenac Sodium  4 g Topical QID   fluticasone furoate-vilanterol  1 puff Inhalation Daily   umeclidinium bromide  1 puff Inhalation Daily   Continuous Infusions:  sodium chloride Stopped (08/23/21 0149)   PRN Meds: sodium chloride, acetaminophen **OR** acetaminophen, albuterol, benzonatate, diclofenac Sodium, Muscle Rub, senna-docusate   Vital Signs    Vitals:   08/29/21 0230 08/29/21 0359 08/29/21 0732 08/29/21 1127  BP:  94/79  95/64  Pulse:  90  79  Resp:  19  16  Temp:  97.7 F (36.5 C)  98.2 F (36.8 C)  TempSrc:  Oral  Oral  SpO2:  98% 98% 95%  Weight: 97.8 kg     Height:        Intake/Output Summary (Last 24 hours) at 08/29/2021 1231 Last data filed at 08/29/2021 0758 Gross per 24 hour  Intake 1316 ml  Output 1258 ml  Net 58 ml    Last 3 Weights 08/29/2021 08/28/2021 08/27/2021  Weight (lbs) 215 lb 9.8 oz 216 lb 7.9 oz 215 lb 13.3 oz  Weight (kg) 97.8 kg 98.2 kg 97.9 kg      Telemetry    Afib at 100-105- Personally Reviewed  ECG      - Personally Reviewed  Physical Exam  Physical Exam: Blood pressure 95/64, pulse 79, temperature 98.2 F (36.8 C), temperature source Oral,  resp. rate 16, height 5\' 6"  (1.676 m), weight 97.8 kg, SpO2 95 %.  GEN:  moderately obese,  NAD  HEENT: Normal NECK: No JVD; No carotid bruits LYMPHATICS: No lymphadenopathy CARDIAC: RRR , no murmurs, rubs, gallops RESPIRATORY:  Clear to auscultation without rales, wheezing or rhonchi  ABDOMEN: Soft, non-tender, non-distended MUSCULOSKELETAL:  1-2+ edema in B legs No deformity  SKIN: Warm and dry NEUROLOGIC:  Alert and oriented x 3    Labs    High Sensitivity Troponin:   Recent Labs  Lab 08/22/21 0241 08/22/21 0455  TROPONINIHS 30* 31*      Chemistry Recent Labs  Lab 08/23/21 0315 08/24/21 0319 08/25/21 0217 08/26/21 0336 08/27/21 0219 08/28/21 0129 08/29/21 0331 08/29/21 0641  NA 138   < > 135   < > 136 136 135  --   K 4.7   < > 4.0   < > 4.0 4.0 4.2  --   CL 102   < > 97*   < > 94* 94* 97*  --   CO2 21*   < > 25   < > 31 30 27   --   GLUCOSE 111*   < >  99   < > 115* 88 99  --   BUN 40*   < > 44*   < > 46* 48* 53*  --   CREATININE 2.18*   < > 1.78*   < > 1.87* 2.21* 2.29*  --   CALCIUM 8.6*   < > 8.0*   < > 8.3* 8.5* 8.6*  --   MG 2.1   < > 1.8  --  2.4  --   --  2.5*  PROT 7.2  --   --   --   --   --   --   --   ALBUMIN 3.1*  --   --   --   --   --   --   --   AST 32  --   --   --   --   --   --   --   ALT 32  --   --   --   --   --   --   --   ALKPHOS 173*  --   --   --   --   --   --   --   BILITOT 0.7  --   --   --   --   --   --   --   GFRNONAA 31*   < > 40*   < > 37* 30* 29*  --   ANIONGAP 15   < > 13   < > 11 12 11   --    < > = values in this interval not displayed.     Lipids  Recent Labs  Lab 08/23/21 0315  CHOL 71  TRIG 66  HDL 27*  LDLCALC 31  CHOLHDL 2.6     Hematology Recent Labs  Lab 08/27/21 0219 08/28/21 0129 08/29/21 0331  WBC 12.0* 10.1 10.8*  RBC 4.77 4.69 4.71  HGB 14.6 14.1 14.4  HCT 44.7 44.5 43.6  MCV 93.7 94.9 92.6  MCH 30.6 30.1 30.6  MCHC 32.7 31.7 33.0  RDW 14.8 15.3 15.1  PLT 242 234 239    Thyroid  Recent  Labs  Lab 08/22/21 1516  TSH 1.462     BNP No results for input(s): BNP, PROBNP in the last 168 hours.   DDimer No results for input(s): DDIMER in the last 168 hours.   Radiology    No results found.  Cardiac Studies      Patient Profile     76 y.o. male with atrial fib, CHF , CAD   Assessment & Plan    Acute on chronic combined systolic and diastolic congestive heart failure: Creatinine is up slighlty.  Will hold lasix tomorrew ( has already received it today )  Will try to add in beta block in the next day or.   His BP is marginal at this point  Cont other meds.     2.   Atrial fib:   cont med.s   3.   CAD :   no angina     4.  Hyperlipidemia :  cont atorvastatin    For questions or updates, please contact [place] Please consult [email or web address] for contact info under        Signed, Mertie Moores, MD  08/29/2021, 12:31 PM

## 2021-08-29 NOTE — TOC Progression Note (Signed)
Transition of Care [facility]) - Progression Note    Patient Details  Name: [name] MRN: [id number] Date of Birth: 30-Aug-1945  [facility] of [facility]) CM/SW Contact  Zenon Mayo, RN Phone Number: 08/29/2021, 2:17 PM  Clinical Narrative:    Patient will need tub bench, NCM made referral to [facility] with [employer] , they will bring tub bench up to patient room. NCM spoke with patient at bedside, he lives with wife, he has a rolling walker  and cane that he uses.  Has PCP, he has no issues with his medications, he uses a pillbox, he has bp cuff and scale which he uses daily, he eats low sodium diet.  He states he drives to his MD apts.  NCM offered choice for [facility] for disease management and HHPT from [email or web address].  He states he does not have a preference and does not have preference for DME agency. NCM made referral to [facility] with [facility].  He is able to take referral.  Soc will begin 24 to 48 hrs post dc.      Expected Discharge Plan: [place] Barriers to Discharge: Continued Medical Work up  Expected Discharge Plan and Services Expected Discharge Plan: [place]   Discharge Planning Services: CM Consult Post Acute Care Choice: [place] arrangements for the past 2 months: [place]                   DME Agency [employer] Arranged Tub Bench Date DME Agency Contacted 0210/23 Time DME Agency Contacted [phone] Representative spoke with at [place]: RN, Disease Management, PT [place] Agency: [place] Date [place]: 08/24/21 Time [place]: 1026 Representative spoke with at [place]: [place] ([place]) Interventions    Readmission Risk Interventions Readmission Risk Prevention Plan 08/24/2021 05/17/2019 02/13/2019  Transportation Screening Complete Complete Complete  PCP or Specialist Appt within 3-5 Days Complete - -  HRI or  Home Care Consult Complete - -  Social Work Consult for [place] Planning/Counseling Complete - -  Palliative Care Screening Not Applicable - -  Medication Review Press photographer) Complete Complete Complete  PCP or Specialist appointment within 3-5 days of discharge - Patient refused Complete  HRI or [place] - Patient refused Complete  SW Recovery Care/Counseling Consult - - Complete  Palliative Care Screening - - Not [place] - Not Applicable Not Applicable  Some recent data might be hidden

## 2021-08-30 DIAGNOSIS — I5043 Acute on chronic combined systolic (congestive) and diastolic (congestive) heart failure: Secondary | ICD-10-CM | POA: Diagnosis not present

## 2021-08-30 LAB — CBC
HCT: 44.3 % (ref 39.0–52.0)
Hemoglobin: 14 g/dL (ref 13.0–17.0)
MCH: 30 pg (ref 26.0–34.0)
MCHC: 31.6 g/dL (ref 30.0–36.0)
MCV: 94.9 fL (ref 80.0–100.0)
Platelets: 248 10*3/uL (ref 150–400)
RBC: 4.67 MIL/uL (ref 4.22–5.81)
RDW: 15.1 % (ref 11.5–15.5)
WBC: 10.2 10*3/uL (ref 4.0–10.5)
nRBC: 0 % (ref 0.0–0.2)

## 2021-08-30 LAB — BASIC METABOLIC PANEL
Anion gap: 12 (ref 5–15)
BUN: 56 mg/dL — ABNORMAL HIGH (ref 8–23)
CO2: 26 mmol/L (ref 22–32)
Calcium: 8.6 mg/dL — ABNORMAL LOW (ref 8.9–10.3)
Chloride: 98 mmol/L (ref 98–111)
Creatinine, Ser: 1.73 mg/dL — ABNORMAL HIGH (ref 0.61–1.24)
GFR, Estimated: 41 mL/min — ABNORMAL LOW (ref >60)
Glucose, Bld: 110 mg/dL — ABNORMAL HIGH (ref 70–99)
Potassium: 4.4 mmol/L (ref 3.5–5.1)
Sodium: 136 mmol/L (ref 135–145)

## 2021-08-30 MED ORDER — FUROSEMIDE 10 MG/ML IJ SOLN
40.0000 mg | Freq: Every day | INTRAMUSCULAR | Status: DC
  Administered 2021-08-30: 40 mg via INTRAVENOUS
  Filled 2021-08-30: qty 4

## 2021-08-30 MED ORDER — METOPROLOL SUCCINATE ER 25 MG PO TB24
25.0000 mg | ORAL_TABLET | Freq: Every day | ORAL | Status: DC
  Administered 2021-08-30 – 2021-08-31 (×2): 25 mg via ORAL
  Filled 2021-08-30 (×2): qty 1

## 2021-08-30 NOTE — Progress Notes (Signed)
Progress Note  Patient Name: [name] Date of Encounter: 08/30/2021  Primary Cardiologist:   Belva Crome III, MD   Subjective   He is breathing better but not at baseline.   Inpatient Medications    Scheduled Meds:  amiodarone  400 mg Oral BID   apixaban  5 mg Oral BID   atorvastatin  80 mg Oral Daily   buprenorphine-naloxone  1 tablet Sublingual BID   clopidogrel  75 mg Oral Daily   diclofenac Sodium  4 g Topical QID   fluticasone furoate-vilanterol  1 puff Inhalation Daily   umeclidinium bromide  1 puff Inhalation Daily   Continuous Infusions:  sodium chloride Stopped (08/23/21 0149)   PRN Meds: sodium chloride, acetaminophen **OR** acetaminophen, albuterol, benzonatate, diclofenac Sodium, Muscle Rub, senna-docusate   Vital Signs    Vitals:   08/29/21 1127 08/29/21 1929 08/30/21 0335 08/30/21 0720  BP: 95/64  100/85   Pulse: 79 89 94   Resp: 16 16 20    Temp: 98.2 F (36.8 C) 98.2 F (36.8 C) 97.6 F (36.4 C)   TempSrc: Oral Oral Oral   SpO2: 95% 95% 98% 96%  Weight:   97.7 kg   Height:        Intake/Output Summary (Last 24 hours) at 08/30/2021 0735 Last data filed at 08/30/2021 0143 Gross per 24 hour  Intake 1188 ml  Output 1183 ml  Net 5 ml   Filed Weights   08/28/21 0632 08/29/21 0230 08/30/21 0335  Weight: 98.2 kg 97.8 kg 97.7 kg    Telemetry    Atrial fib - Personally Reviewed  ECG    NA - Personally Reviewed  Physical Exam   GEN: No acute distress.   Neck: No  JVD Cardiac: Irregular RR, no murmurs, rubs, or gallops.  Respiratory:      Bilateral basilar crackles.  GI: Soft, nontender, non-distended  MS:   Severe leg edema; No deformity. Neuro:  Nonfocal  Psych: Normal affect   Labs    Chemistry Recent Labs  Lab 08/28/21 0129 08/29/21 0331 08/30/21 0531  NA 136 135 136  K 4.0 4.2 4.4  CL 94* 97* 98  CO2 30 27 26   GLUCOSE 88 99 110*  BUN 48* 53* 56*  CREATININE 2.21* 2.29* 1.73*  CALCIUM 8.5* 8.6* 8.6*   GFRNONAA 30* 29* 41*  ANIONGAP 12 11 12      Hematology Recent Labs  Lab 08/28/21 0129 08/29/21 0331 08/30/21 0255  WBC 10.1 10.8* 10.2  RBC 4.69 4.71 4.67  HGB 14.1 14.4 14.0  HCT 44.5 43.6 44.3  MCV 94.9 92.6 94.9  MCH 30.1 30.6 30.0  MCHC 31.7 33.0 31.6  RDW 15.3 15.1 15.1  PLT 234 239 248    Cardiac EnzymesNo results for input(s): TROPONINI in the last 168 hours. No results for input(s): TROPIPOC in the last 168 hours.   BNPNo results for input(s): BNP, PROBNP in the last 168 hours.   DDimer No results for input(s): DDIMER in the last 168 hours.   Radiology    No results found.  Cardiac Studies   Echo:   1. Left ventricular ejection fraction, by estimation, is 35 to 40%. The  left ventricle has moderately decreased function. The left ventricle  demonstrates global hypokinesis. There is moderate left ventricular  hypertrophy. Left ventricular diastolic  parameters are indeterminate.   2. Right ventricular systolic function is normal. The right ventricular  size is normal. There is normal pulmonary artery systolic pressure.  3. Right atrial size was mildly dilated.   4. PISA 0.8 cm      ERO 0.34 cm2      MR Vol 52ml. The mitral valve is normal in structure. Severe mitral  valve regurgitation. No evidence of mitral stenosis.   5. Tricuspid valve regurgitation is severe.   6. The aortic valve is tricuspid. Aortic valve regurgitation is moderate.  No aortic stenosis is present.   7. Aortic dilatation noted. There is moderate dilatation of the ascending  aorta, measuring 49 mm.   8. The inferior vena cava is dilated in size with <50% respiratory  variability, suggesting right atrial pressure of 15 mmHg.   Patient Profile     76 y.o. male with CHF, CAD and atrial fib  Assessment & Plan    PAF:   On Eliquis beta-blocker.    I would suggest cardioversion as an out patient   Acute on chronic systolic and diastolic HF:    Net -4.8 L.  Even yesterday. I think  he will tolerate a low dose of beta blocker for med titration and rate control.   CAD: Continuing Plavix and Eliquis.  Continue amiodarone.  Rate is mildly elevated.   CKD IIIA: Creatinine back down to 1.73.  I will suggest 40 mg IV Lasix and he has to have his feet elevated and his stockings on .    For questions or updates, please contact [place] Please consult [email or web address] for contact info under Cardiology/STEMI.   Signed, Minus Breeding, MD  08/30/2021, 7:35 AM

## 2021-08-30 NOTE — Plan of Care (Signed)
  Problem: Education: Goal: Knowledge of General Education information will improve Description Including pain rating scale, medication(s)/side effects and non-pharmacologic comfort measures Outcome: Progressing   Problem: Health Behavior/Discharge Planning: Goal: Ability to manage health-related needs will improve Outcome: Progressing   

## 2021-08-30 NOTE — Progress Notes (Signed)
SATURATION QUALIFICATIONS: (This note is used to comply with regulatory documentation for home oxygen)  Patient Saturations on Room Air at Rest = 99%  Patient Saturations on Room Air while Ambulating = 98%  Patient Saturations on N/A Liters of oxygen while Ambulating = N/A%   Please briefly explain why patient needs home oxygen: N/A  [name] Mobility Specialist  Phone: [phone]

## 2021-08-30 NOTE — Progress Notes (Signed)
Mobility Specialist Progress Note:   08/30/21 1150  Mobility  Activity Ambulated with assistance in hallway;Ambulated with assistance to bathroom  Level of Assistance Contact guard assist, steadying assist  Assistive Device Cane  Distance Ambulated (ft) 70 ft  Activity Response Tolerated fair  $Mobility charge 1 Mobility    Pre Mobility: SpO2 99% RA During Mobility: SpO2 98% RA Post Mobility: SpO2 100% RA  Pt without supplemental O2 upon arrival. Still displaying minor impulsive movements throughout session. Would benefit from using RW, since pt reaching out with LUE for rails while using SPC with RLE. Pt back in chair with all needs met. Ambulatory sat note to follow.  [name] Mobility Specialist  Phone: [phone]

## 2021-08-30 NOTE — Progress Notes (Signed)
°  Subjective:  No acute events overnight. He states that his breathing has improved. He is eating and drinking without any difficulty. He denies any chest pain or abdominal pain.   Objective:  Vital signs in last 24 hours: Vitals:   08/30/21 0335 08/30/21 0720 08/30/21 0951 08/30/21 1100  BP: 100/85  117/66 111/70  Pulse: 94   83  Resp: 20  20 20   Temp: 97.6 F (36.4 C)   97.8 F (36.6 C)  TempSrc: Oral   Oral  SpO2: 98% 96%  95%  Weight: 97.7 kg     Height:       Constitutional: alert, well-appearing, in NAD HENT: normocephalic, atraumatic, mucous membranes moist Cardiovascular: Irregularly, irregular, no m/r/g, 2+ bilateral LE up to knee, stable from day prior Pulmonary/Chest: Non labored breathing on RA, no wheezing, mild crackles at the L base MSK: knees are not warm to touch Abdominal: soft, non-tender to palpation, non-distended Neurological: A&O x 3 Skin: warm and dry  Assessment/Plan:  Principal Problem:   Acute on chronic heart failure (HCC) Active Problems:   Atrial fibrillation (HCC)   Tobacco abuse   Polycythemia vera (HCC)   COPD (chronic obstructive pulmonary disease) (HCC)   Opioid use disorder    #Acute HFrEF exacerbation   #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 His weight is stable off of diuresis. 1.18L of UOP recorded.  -Cardiology following, appreciate assistance   -One-time dose of IV Lasix 40 mg started this a.m. by cardiology with plan to transition patient to p.o. diuretic regimen in the a.m. and likely discharge with close follow-up with cardiology as well as our clinic. -Trend BMP -GDMT limited by BP, metoprolol succinate 25 mg started this a.m. blood pressures are stable in the 973-5 tens systolic. -Strict ins and outs and daily weights -Patient with ambulatory O2 sats of 98%.   #Paroxysmal Atrial Fibrillation/Flutter #Hx of A  flutter in 2012 s/p ablation in 2012 #Medication nonadherence Rates 85-90's.  Metoprolol succinate started this a.m. -Cardiology following, appreciate assistance  -Continue PO amio, patient will need follow-up with cardiology in a week or 2 to transition patient to maintenance dose -Continue Eliquis 5 mg -Patient will have TEE/DCCV once discharge   #CKD stage III a #Hypokalemia Cr down this AM  1.7 -> 1.9 -> 2.2 -> 2.2-->1.73.  IV Lasix was resumed. -Trend BMP -Replete electrolytes as needed  #Left knee pain, resolved  Suspect 2/2 ongoing fluid overload and osteoarthritic pain.  Improved with Voltaren gel.  - Voltaren gel - Continue to diurese    #Coronary Artery Disease s/p LAD DES in March 2022 #PAD -Continue Plavix 75 mg daily -Continue Lipitor 40 mg daily -Follow up with cardiology as an outpatient    #Polysubstance abuse -Continue Suboxone 8-2 mg bid  -Monitor for signs of withdrawal   #COPD -Dulera daily -Albuterol as needed   #OSA -nightly CPAP   #Prediabetes A1c 6.0% this admission   Best Practice: Diet: Heart Healthy IVF: None,None VTE: Eliquis  Code: Full   Rick Duff, MD PGY-2 Internal Medicine  Pager [phone]

## 2021-08-30 NOTE — Progress Notes (Incomplete)
°  Subjective: NAOE  Patient was seen at bedside during rounds today. Reports feeling well, and feels breathing has improved. He is eating and drinking without problems. He denies chest pain and abdominal pain. He reports that he is ready to go home. He understands that he needs close cardiology follow up and with PCP.   Objective:  Vital signs in last 24 hours: Vitals:   08/30/21 0335 08/30/21 0720 08/30/21 0951 08/30/21 1100  BP: 100/85  117/66 111/70  Pulse: 94   83  Resp: 20  20 20   Temp: 97.6 F (36.4 C)   97.8 F (36.6 C)  TempSrc: Oral   Oral  SpO2: 98% 96%  95%  Weight: 97.7 kg     Height:       Constitutional: alert, well-appearing, in NAD HENT: normocephalic, atraumatic, mucous membranes moist Cardiovascular: IRIR, no m/r/g, 2+ bilateral LE up to knee Pulmonary/Chest: non labored breathing on RA, LCTAB  MSK: knees are not warm to touch Abdominal: soft, non-tender to palpation, non-distended Neurological: A&O x 3 Skin: warm and dry  Assessment/Plan:  Principal Problem:   Acute on chronic heart failure (HCC) Active Problems:   Atrial fibrillation (HCC)   Tobacco abuse   Polycythemia vera (HCC)   COPD (chronic obstructive pulmonary disease) (HCC)   Opioid use disorder    #Acute HFrEF exacerbation   #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 S/p IV Lasix 40 yesterday -> diuresed net -935cc, and Cr bump from 1.7 -> 2; I think we have reached our limit.  -Cardiology following, appreciate assistance   -Transition to PO Lasix; close OP Cardiology follow up  -Stable for d/c but pending DC tmrw as TOC pharmacy closed today -Trend BMP -GDMT limited by BP, started metop succ 25 mg yesterday  -Strict ins and outs and daily weights -Patient with ambulatory O2 sats of 98%.   #Paroxysmal Atrial Fibrillation/Flutter #Hx of A flutter in 2012 s/p ablation in  2012 #Medication nonadherence Rates 75-85's. Metoprolol succinate started yesterday.  -Cardiology following, appreciate assistance  -Continue PO amio, patient will need follow-up with cardiology in 1-2 weeks to transition to maintenance dose and then possible cardioversion. -Continue metop succ 25 mg daily  -Continue Eliquis 5 mg   #CKD stage III a #Hypokalemia Cr 2.2 -> 1.7 -> 2 s/p IV Lasix 40 yesterday (baseline 1.54). -Trend BMP -Hold any further IV Lasix  -Replete electrolytes as needed  #Left knee pain, resolved  Suspect 2/2 fluid overload. Improved with Voltaren gel.  - Voltaren gel - Continue to diurese    #Coronary Artery Disease s/p LAD DES in March 2022 #PAD -Continue Plavix 75 mg daily -Restarted Eliquis 5 mg daily -Continue Lipitor 40 mg daily -Follow up with cardiology as an outpatient    #Polysubstance abuse -Continue Suboxone 8-2 mg bid  -Monitor for signs of withdrawal   #COPD -Dulera daily -Albuterol as needed   #OSA -nightly CPAP   #Prediabetes A1c 6.0% this admission   Best Practice: Diet: Heart Healthy IVF: None,None VTE: Eliquis  Code: Full   [name], MD  Internal Medicine Resident, PGY-1 Pager: [phone] After 5pm on weekdays and 1pm on weekends: On Call pager [phone]

## 2021-08-30 NOTE — Plan of Care (Signed)
?  Problem: Clinical Measurements: ?Goal: Respiratory complications will improve ?Outcome: Progressing ?  ?Problem: Elimination: ?Goal: Will not experience complications related to urinary retention ?Outcome: Progressing ?  ?Problem: Safety: ?Goal: Ability to remain free from injury will improve ?Outcome: Progressing ?  ?

## 2021-08-31 DIAGNOSIS — I5043 Acute on chronic combined systolic (congestive) and diastolic (congestive) heart failure: Secondary | ICD-10-CM | POA: Diagnosis not present

## 2021-08-31 LAB — BASIC METABOLIC PANEL
Anion gap: 12 (ref 5–15)
BUN: 57 mg/dL — ABNORMAL HIGH (ref 8–23)
CO2: 25 mmol/L (ref 22–32)
Calcium: 8.6 mg/dL — ABNORMAL LOW (ref 8.9–10.3)
Chloride: 97 mmol/L — ABNORMAL LOW (ref 98–111)
Creatinine, Ser: 2.02 mg/dL — ABNORMAL HIGH (ref 0.61–1.24)
GFR, Estimated: 34 mL/min — ABNORMAL LOW (ref >60)
Glucose, Bld: 152 mg/dL — ABNORMAL HIGH (ref 70–99)
Potassium: 4.3 mmol/L (ref 3.5–5.1)
Sodium: 134 mmol/L — ABNORMAL LOW (ref 135–145)

## 2021-08-31 MED ORDER — METOPROLOL SUCCINATE ER 25 MG PO TB24: 25.0000 mg | ORAL_TABLET | Freq: Every day | ORAL | 1 refills | Status: DC

## 2021-08-31 MED ORDER — AMIODARONE HCL 400 MG PO TABS: 400.0000 mg | ORAL_TABLET | Freq: Two times a day (BID) | ORAL | 0 refills | Status: DC

## 2021-08-31 MED ORDER — CLOPIDOGREL BISULFATE 75 MG PO TABS: 75.0000 mg | ORAL_TABLET | Freq: Every day | ORAL | 1 refills | Status: DC

## 2021-08-31 MED ORDER — METOPROLOL SUCCINATE ER 25 MG PO TB24: 25.0000 mg | ORAL_TABLET | Freq: Every day | ORAL | 0 refills | Status: DC

## 2021-08-31 MED ORDER — CLOPIDOGREL BISULFATE 75 MG PO TABS: 75.0000 mg | ORAL_TABLET | Freq: Every day | ORAL | 0 refills | Status: DC

## 2021-08-31 MED ORDER — SENNOSIDES-DOCUSATE SODIUM 8.6-50 MG PO TABS: 1.0000 | ORAL_TABLET | Freq: Every evening | ORAL | 0 refills | Status: AC | PRN

## 2021-08-31 MED ORDER — DICLOFENAC SODIUM 1 % EX GEL: 2.0000 g | Freq: Four times a day (QID) | CUTANEOUS | 0 refills | Status: DC | PRN

## 2021-08-31 MED ORDER — FUROSEMIDE 40 MG PO TABS: 40.0000 mg | ORAL_TABLET | Freq: Two times a day (BID) | ORAL | 0 refills | Status: DC

## 2021-08-31 NOTE — Discharge Instructions (Signed)
You were admitted due to a heart failure exacerbation and atrial fibtrillation. This resulted in fluid to build up in  your body. Please start taking Lasix and Amiodarone. You will need to follow up with cardiology and the internal medicine center to continue to make sure you do well and to check labs. Please follow the instructions on your discharge to ensure that you follow up to all of your appointments.

## 2021-08-31 NOTE — Progress Notes (Signed)
Patient discharged to home with home health. IV and telemetry removed. Discharge instructions explained and given to patient.

## 2021-08-31 NOTE — Plan of Care (Signed)
°  Problem: Education: Goal: Knowledge of General Education information will improve Description: Including pain rating scale, medication(s)/side effects and non-pharmacologic comfort measures Outcome: Adequate for Discharge   Problem: Health Behavior/Discharge Planning: Goal: Ability to manage health-related needs will improve Outcome: Adequate for Discharge   Problem: Clinical Measurements: Goal: Ability to maintain clinical measurements within normal limits will improve Outcome: Adequate for Discharge   Problem: Clinical Measurements: Goal: Diagnostic test results will improve Outcome: Adequate for Discharge   Problem: Clinical Measurements: Goal: Respiratory complications will improve Outcome: Adequate for Discharge   Problem: Clinical Measurements: Goal: Cardiovascular complication will be avoided Outcome: Adequate for Discharge   Problem: Activity: Goal: Risk for activity intolerance will decrease Outcome: Adequate for Discharge   Problem: Elimination: Goal: Will not experience complications related to bowel motility Outcome: Adequate for Discharge   Problem: Elimination: Goal: Will not experience complications related to urinary retention Outcome: Adequate for Discharge   Problem: Safety: Goal: Ability to remain free from injury will improve Outcome: Adequate for Discharge   Problem: Skin Integrity: Goal: Risk for impaired skin integrity will decrease Outcome: Adequate for Discharge   Problem: Education: Goal: Ability to demonstrate management of disease process will improve Outcome: Adequate for Discharge   Problem: Education: Goal: Ability to verbalize understanding of medication therapies will improve Outcome: Adequate for Discharge   Problem: Education: Goal: Individualized Educational Video(s) Outcome: Adequate for Discharge   Problem: Activity: Goal: Capacity to carry out activities will improve Outcome: Adequate for Discharge   Problem:  Cardiac: Goal: Ability to achieve and maintain adequate cardiopulmonary perfusion will improve Outcome: Adequate for Discharge   Problem: Education: Goal: Knowledge of disease or condition will improve Outcome: Adequate for Discharge   Problem: Education: Goal: Understanding of medication regimen will improve Outcome: Adequate for Discharge   Problem: Education: Goal: Individualized Educational Video(s) Outcome: Adequate for Discharge   Problem: Activity: Goal: Ability to tolerate increased activity will improve Outcome: Adequate for Discharge   Problem: Cardiac: Goal: Ability to achieve and maintain adequate cardiopulmonary perfusion will improve Outcome: Adequate for Discharge   Problem: Health Behavior/Discharge Planning: Goal: Ability to safely manage health-related needs after discharge will improve Outcome: Adequate for Discharge   Problem: Education: Goal: Knowledge of disease or condition will improve Outcome: Adequate for Discharge   Problem: Education: Goal: Knowledge of the prescribed therapeutic regimen will improve Outcome: Adequate for Discharge   Problem: Activity: Goal: Ability to tolerate increased activity will improve Outcome: Adequate for Discharge   Problem: Activity: Goal: Will verbalize the importance of balancing activity with adequate rest periods Outcome: Adequate for Discharge   Problem: Respiratory: Goal: Ability to maintain a clear airway will improve Outcome: Adequate for Discharge   Problem: Respiratory: Goal: Levels of oxygenation will improve Outcome: Adequate for Discharge   Problem: Respiratory: Goal: Ability to maintain adequate ventilation will improve Outcome: Adequate for Discharge

## 2021-08-31 NOTE — Progress Notes (Addendum)
Progress Note  Patient Name: [name] Date of Encounter: 08/31/2021  Primary Cardiologist:   Sinclair Grooms, MD   Subjective   He is breathing better.  Denies pain.   Inpatient Medications    Scheduled Meds:  amiodarone  400 mg Oral BID   apixaban  5 mg Oral BID   atorvastatin  80 mg Oral Daily   buprenorphine-naloxone  1 tablet Sublingual BID   clopidogrel  75 mg Oral Daily   diclofenac Sodium  4 g Topical QID   fluticasone furoate-vilanterol  1 puff Inhalation Daily   metoprolol succinate  25 mg Oral Daily   umeclidinium bromide  1 puff Inhalation Daily   Continuous Infusions:  sodium chloride Stopped (08/23/21 0149)   PRN Meds: sodium chloride, acetaminophen **OR** acetaminophen, albuterol, benzonatate, diclofenac Sodium, Muscle Rub, senna-docusate   Vital Signs    Vitals:   08/30/21 1945 08/31/21 0314 08/31/21 0617 08/31/21 0720  BP: 96/60 120/68    Pulse: 77 87    Resp: 19 20    Temp: 97.9 F (36.6 C) 98.4 F (36.9 C)    TempSrc: Oral Oral    SpO2: 96% 98%  96%  Weight:   98.1 kg   Height:        Intake/Output Summary (Last 24 hours) at 08/31/2021 0738 Last data filed at 08/31/2021 0616 Gross per 24 hour  Intake 840 ml  Output 1775 ml  Net -935 ml   Filed Weights   08/29/21 0230 08/30/21 0335 08/31/21 0617  Weight: 97.8 kg 97.7 kg 98.1 kg    Telemetry    Atrial fib with controlled rate  - Personally Reviewed  ECG    NA - Personally Reviewed  Physical Exam   GEN: No  acute distress.   Neck: No  JVD Cardiac: IrregularRR, no murmurs, rubs, or gallops.  Respiratory:   Bilateral basilar crackles GI: Soft, nontender, non-distended, normal bowel sounds  MS:  Severe edema; No deformity. Neuro:   Nonfocal  Psych: Oriented and appropriate    Labs    Chemistry Recent Labs  Lab 08/29/21 0331 08/30/21 0531 08/31/21 0435  NA 135 136 134*  K 4.2 4.4 4.3  CL 97* 98 97*  CO2 27 26 25   GLUCOSE 99 110* 152*  BUN 53* 56*  57*  CREATININE 2.29* 1.73* 2.02*  CALCIUM 8.6* 8.6* 8.6*  GFRNONAA 29* 41* 34*  ANIONGAP 11 12 12      Hematology Recent Labs  Lab 08/28/21 0129 08/29/21 0331 08/30/21 0255  WBC 10.1 10.8* 10.2  RBC 4.69 4.71 4.67  HGB 14.1 14.4 14.0  HCT 44.5 43.6 44.3  MCV 94.9 92.6 94.9  MCH 30.1 30.6 30.0  MCHC 31.7 33.0 31.6  RDW 15.3 15.1 15.1  PLT 234 239 248    Cardiac EnzymesNo results for input(s): TROPONINI in the last 168 hours. No results for input(s): TROPIPOC in the last 168 hours.   BNPNo results for input(s): BNP, PROBNP in the last 168 hours.   DDimer No results for input(s): DDIMER in the last 168 hours.   Radiology    No results found.  Cardiac Studies   Echo:   1. Left ventricular ejection fraction, by estimation, is 35 to 40%. The  left ventricle has moderately decreased function. The left ventricle  demonstrates global hypokinesis. There is moderate left ventricular  hypertrophy. Left ventricular diastolic  parameters are indeterminate.   2. Right ventricular systolic function is normal. The right ventricular  size is  normal. There is normal pulmonary artery systolic pressure.   3. Right atrial size was mildly dilated.   4. PISA 0.8 cm      ERO 0.34 cm2      MR Vol 68ml. The mitral valve is normal in structure. Severe mitral  valve regurgitation. No evidence of mitral stenosis.   5. Tricuspid valve regurgitation is severe.   6. The aortic valve is tricuspid. Aortic valve regurgitation is moderate.  No aortic stenosis is present.   7. Aortic dilatation noted. There is moderate dilatation of the ascending  aorta, measuring 49 mm.   8. The inferior vena cava is dilated in size with <50% respiratory  variability, suggesting right atrial pressure of 15 mmHg.   Patient Profile     76 y.o. male with CHF, CAD and atrial fib  Assessment & Plan    PAF:   On Eliquis beta-blocker.    We will plan out patient visit and DCCV after at least 3 weeks of DOAC.   I  would suggest that we continue current dose of amio but we will see him back next week and reduce the dose.    Acute on chronic systolic and diastolic HF:    Net 5.8  L.   I have suggested 40 mg p.o. twice daily of Lasix if he is discharged today.  He is still quite volume overloaded and needs significant education and close follow up.     CAD: Continuing Plavix and Eliquis.    CKD IIIA: Creatinine back up a little bit.  We will follow closely.   He will need close follow up of his labs. He could not wear compression stockings.  For questions or updates, please contact [facility] Please consult [email or web address] for contact info under Cardiology/STEMI.   Signed, Minus Breeding, MD  08/31/2021, 7:38 AM

## 2021-08-31 NOTE — Progress Notes (Signed)
SATURATION QUALIFICATIONS: (This note is used to comply with regulatory documentation for home oxygen)  Patient Saturations on Room Air at Rest = 99%  Patient Saturations on Room Air while Ambulating = 92%  Patient Saturations on 0Liters of oxygen while Ambulating = 92%  Please briefly explain why patient needs home oxygen: 

## 2021-08-31 NOTE — Discharge Summary (Signed)
Name: [name] MRN: [id number] DOB: [birth year] 76 y.o. PCP: Ryan Payment, MD  Date of Admission: 08/22/2021  2:22 AM Date of Discharge:  08/31/21 Attending Physician: Dr. Cain Sieve   DISCHARGE DIAGNOSIS:  Primary Problem: Acute on chronic heart failure [facility])   Hospital Problems: Principal Problem:   Acute on chronic heart failure ([place]) Active Problems:   Atrial fibrillation ([place])   Tobacco abuse   Polycythemia vera ([place])   COPD (chronic obstructive pulmonary disease) ([place])   Opioid use disorder    DISCHARGE MEDICATIONS:   Allergies as of 08/31/2021   No Known Allergies      Medication List     STOP taking these medications    aspirin 81 MG chewable tablet       TAKE these medications    acetaminophen 500 MG tablet Commonly known as: TYLENOL Take 1,000 mg by mouth every 6 (six) hours as needed for mild pain.   albuterol 108 (90 Base) MCG/ACT inhaler Commonly known as: ProAir HFA Inhale 1-2 puffs into the lungs every 6 (six) hours as needed for wheezing or shortness of breath.   amiodarone 400 MG tablet Commonly known as: PACERONE Take 1 tablet (400 mg total) by mouth 2 (two) times daily. What changed:  medication strength how much to take when to take this   apixaban 5 MG Tabs tablet Commonly known as: Eliquis Take 1 tablet (5 mg total) by mouth 2 (two) times daily.   atorvastatin 80 MG tablet Commonly known as: LIPITOR Take 1 tablet (80 mg total) by mouth daily.   camphor-menthol lotion Commonly known as: SARNA Apply topically 3 (three) times daily. What changed:  how much to take when to take this reasons to take this   clopidogrel 75 MG tablet Commonly known as: PLAVIX Take 1 tablet (75 mg total) by mouth daily. Start taking on: September 01, 2021   diclofenac Sodium 1 % Gel Commonly known as: VOLTAREN Apply 2 g topically 4 (four) times daily as needed (as needed for pain).   furosemide 40 MG tablet Commonly known as:  Lasix Take 1 tablet (40 mg total) by mouth 2 (two) times daily. What changed: when to take this   metoprolol succinate 25 MG 24 hr tablet Commonly known as: TOPROL-XL Take 1 tablet (25 mg total) by mouth daily. Start taking on: September 01, 2021   senna-docusate 8.6-50 MG tablet Commonly known as: Senokot-S Take 1 tablet by mouth at bedtime as needed for mild constipation.   Suboxone 8-2 MG Film Generic drug: Buprenorphine HCl-Naloxone HCl Place 2 Film under the tongue daily.   Symbicort 160-4.5 MCG/ACT inhaler Generic drug: budesonide-formoterol Inhale 2 puffs into the lungs at bedtime.               Durable Medical Equipment  (From admission, onward)           Start     Ordered   08/25/21 1426  For home use only DME Tub bench  Once        08/25/21 1425           DISPOSITION AND FOLLOW-UP:  [name] was discharged from [facility] in stable condition. At the hospital follow up visit please address:  Follow-up Recommendations: Consults: Cardiology  Labs: BMP in 1 week  Studies: none  Medications: Lasix 40 BID, Amiodarone 400 BID  Follow-up Appointments:  Follow-up Information     [facility], [facility] Follow up.   Specialty: Home Health  Services Why: [place] will contact you with apt times Contact information: [address] [place] [address] [phone]         [facility], [facility] Follow up.   Why: tub bench Contact information: [address] [place] [address] [facility] Follow up.   Specialty: Cardiology Why: this office should call you Monday with follow up appt.  if you do not hear by Tuesday please call the office.   you will then follow up with dr. Micah Hamilton information: [address] [phone] [place]        Ryan Payment, MD. Call  in 1 day(s).   Specialty: Internal Medicine Why: to schedule a 1 week follow up Contact information: [address] [place] [address] [phone]         Ryan Lance, MD .   Specialty: Cardiology Contact information: [phone] [address] [place] [address] [phone]         Ryan Crome, MD .   Specialty: Cardiology Contact information: [phone] [address] [place] [address] [phone]                 HOSPITAL COURSE:  Patient Summary: #Acute HFrEF exacerbation #History of diastolic and systolic heart failure with EF 52% 2/2 ischemic cardiomyopathy #History of medication nonadherence Patient presents with couple day history of shortness of breath and bilateral lower extremity swelling.  BNP elevated from prior to 953, has gained weight since last discharge (~9 kg), CXR with cardiomegaly and possible pulmonary congestion.  Patient is a poor historian.  Endorses adherence to furosemide 40 mg daily.  Reports nonadherence to dietary recommendations for heart failure. Suspect multifactorial from medication noncompliance and dietary indiscretion. S/p IV Lasix 40 in the ED; remains fluid overload on exam with labored breathing on 3L supplemental O2. Cardiology consulted. Started on IV lasix 40 bid, and then increased to IV lasix 120 bid due to poor diuresis and worsening respiratory status. 2D echo without reduced EF (35-40%), mod-severe MR, severe TR, and mild-mod aortic dilatation. No indication for cath at this time. Diuresed 5.8L with IV Lasix. Pt was walked prior to discharge; Patient with ambulatory O2 sats of 98%. Does not meet O2 requirement. GDMT limited by BP, started metop succ 25 mg this admission. Discussed with cardiology, discharged on PO Lasix 40 bid with close outpatient cardiology follow up for medication adjustments and lab work.    #Atrial fibrillation with rapid RVR #History of a flutter in 2012  status post ablation #Medication nonadherence Patient reports not taking Eliquis or amiodarone.  Has history of medication nonadherence.  EKG shows atrial fibrillation with rapid RVR with HR of 142.  Heart rate remains elevated in the 120s with irregular rhythm on exam.  Patient denies chest pain, initial troponin 30, repeat 31, flat.  Patient received dose of metoprolol IV 2.5 mg in the ED. Remained in Afib RVR with rates in 110-140's. Consulted cardiology for help in management, given AoC HF exacerbation limiting BB use. Restarted Eliquis 5 mg and started on IV amio, with plans for possible conversion this admission. Transitioned to PO amio 400 bid to reduce fluid burden. Improvement in rates 80's. Metop added close to end of admission (soft pressures limited adding sooner). Plan for close OP cards follow up, including in 1-2 weeks for starting maintenance  dose amio. Plan for potential cardioversion as an outpatient. Discharged on Amio 400 mg bid with plans for close cardiology follow up.    #CKD stage III a #Hypokalemia On arrival BMP with creatinine 1.54 which appears stable from 3 months ago 1.5. Cr bump from 1.7 -> 1.9 -> 2.2 in s/o ongoing IV diuresis (baseline 1.54). Discontinued further diuresis, with improvement, but did bump back to 2 and last day due to 40 IV Lasix. Pt is discharged on PO Lasix 40 BID with instructions for close cardiology and PCP follow up for medication adjustments and lab work. Will need BMET in about 1 week.  -Daily BMPs -Electrolyte repletion as needed   #Chronic venous stasis dermatitis, bilateral #PAD On exam, bilateral lower extremity erythema, 2+ pitting edema bilaterally, small ulcerations and previous healed lesions.  Patient endorses pain and swelling of bilateral lower extremities.  ABIs obtained during last admission October 2022 and bilateral lower extremity arteries were noncompressible.  Patient had been referred to outpatient vascular surgery but has no  appointment scheduled.  Considered cellulitis given low blood pressures and erythema, however patient has been afebrile and erythema and pain affect bilateral lower extremities with known chronic venous stasis dermatitis. -Diuresis as above -Not a candidate for compression wraps given PAD -Tylenol as needed for pain   #Polysubstance abuse Patient has history of alcoholism, tobacco use, history of opioid dependence.  Currently smokes 1 to 2 cigarettes daily, 2-3 rum mixed drinks every other day, on Suboxone which has been getting refilled by Jerrol Banana, Md who is a psychiatrist in [place].  No concern for withdrawal at this time.  Need to verify time of last drink. -Continued Suboxone 8-2 mg sublingual twice daily -TOC consult for addiction resources -Monitored for signs of withdrawal   #Coronary Artery Disease s/p LAD DES Patient had drug-eluting stent placed in LAD in March 2022 and started on Plavix and aspirin for 30-day course followed by Plavix monotherapy.  Patient currently taking Plavix only, no Eliquis.  Was supposed to have follow-up with cardiology following discharge in October 2022 but no appointment made.  Denies chest pain, troponins flat, EKG without ischemic changes. -Continued Plavix 75 mg daily -Restarted Eliquis 5 mg daily -Continued Lipitor 40 mg daily   #COPD PFTs completed 06/2019 FEV1 61%.  Patient reports has albuterol and other inhalers at home.  He is only using these as needed.  Continues to smoke.  No wheezing on exam. -Dulera daily -Albuterol as needed   #OSA On chart review patient had home sleep test December 2020 which was consistent with severe obstructive sleep apnea.  Unclear if patient is on CPAP. Plan: -nightly CPAP   #Prediabetes A1c 6.0% this admission   DISCHARGE INSTRUCTIONS:   Discharge Instructions     (HEART FAILURE PATIENTS) Call MD:  Anytime you have any of the following symptoms: 1) 3 pound weight gain in 24 hours or 5  pounds in 1 week 2) shortness of breath, with or without a dry hacking cough 3) swelling in the hands, feet or stomach 4) if you have to sleep on extra pillows at night in order to breathe.   Complete by: As directed    Call MD for:  difficulty breathing, headache or visual disturbances   Complete by: As directed    Call MD for:  extreme fatigue   Complete by: As directed    Call MD for:  hives   Complete by: As directed    Call MD for:  persistant  dizziness or light-headedness   Complete by: As directed    Call MD for:  persistant nausea and vomiting   Complete by: As directed    Call MD for:  redness, tenderness, or signs of infection (pain, swelling, redness, odor or green/yellow discharge around incision site)   Complete by: As directed    Call MD for:  severe uncontrolled pain   Complete by: As directed    Call MD for:  temperature >100.4   Complete by: As directed    Diet - low sodium heart healthy   Complete by: As directed    Increase activity slowly   Complete by: As directed        SUBJECTIVE:  No acute overnight events. Patient was seen at bedside during rounds this morning. Pt reports feeling well this morning. Denies SHOB, chest pain and abdominal pain. Reports that he is breathing much better than when he came in. He is appreciative of the care he received here.   No other complains or concerns at this time.   All questions were addressed with patient prior to being discharged.   Discharge Vitals:   BP 120/68 (BP Location: Left Arm)    Pulse 87    Temp 98.4 F (36.9 C) (Oral)    Resp 20    Ht 5\' 6"  (1.676 m)    Wt 98.1 kg    SpO2 96%    BMI 34.91 kg/m   OBJECTIVE:  Constitutional: alert, well-appearing, in NAD HENT: normocephalic, atraumatic, mucous membranes moist Cardiovascular: irregularly irregular, no m/r/g, 2+ bilateral LE up to knee (stable) Pulmonary/Chest: mildly labored work of breathing with supplemental O2, LCTAB  MSK: knees are not warm to touch   Abdominal: soft, non-tender to palpation, non-distended Neurological: A&O x 3 Skin: warm and dry  Pertinent Labs, Studies, and Procedures:  CBC Latest Ref Rng & Units 08/30/2021 08/29/2021 08/28/2021  WBC 4.0 - 10.5 K/uL 10.2 10.8(H) 10.1  Hemoglobin 13.0 - 17.0 g/dL 14.0 14.4 14.1  Hematocrit 39.0 - 52.0 % 44.3 43.6 44.5  Platelets 150 - 400 K/uL 248 239 234    CMP Latest Ref Rng & Units 08/31/2021 08/30/2021 08/29/2021  Glucose 70 - 99 mg/dL 152(H) 110(H) 99  BUN 8 - 23 mg/dL 57(H) 56(H) 53(H)  Creatinine 0.61 - 1.24 mg/dL 2.02(H) 1.73(H) 2.29(H)  Sodium 135 - 145 mmol/L 134(L) 136 135  Potassium 3.5 - 5.1 mmol/L 4.3 4.4 4.2  Chloride 98 - 111 mmol/L 97(L) 98 97(L)  CO2 22 - 32 mmol/L 25 26 27   Calcium 8.9 - 10.3 mg/dL 8.6(L) 8.6(L) 8.6(L)  Total Protein 6.5 - 8.1 g/dL - - -  Total Bilirubin 0.3 - 1.2 mg/dL - - -  Alkaline Phos 38 - 126 U/L - - -  AST 15 - 41 U/L - - -  ALT 0 - 44 U/L - - -    DG CHEST PORT 1 VIEW  Result Date: 08/23/2021 CLINICAL DATA:  Dyspnea. EXAM: PORTABLE CHEST 1 VIEW COMPARISON:  08/22/2021. FINDINGS: The heart is enlarged and the mediastinal contour is stable. Atherosclerotic calcification of the aorta is noted. The pulmonary vasculature is distended. Perihilar interstitial and airspace opacities are noted bilaterally. No effusion or pneumothorax. A loop recorder device is present over the left chest. No acute osseous abnormality. IMPRESSION: 1. Cardiomegaly with pulmonary vascular congestion. 2. Perihilar interstitial and airspace opacities, possible edema or infiltrate. Electronically Signed   By: Brett Fairy M.D.   On: 08/23/2021 00:59   DG Chest  Port 1 View  Result Date: 08/22/2021 CLINICAL DATA:  Shortness of breath EXAM: PORTABLE CHEST 1 VIEW COMPARISON:  04/26/2021 FINDINGS: Cardiac shadow is enlarged but stable. Aortic calcifications are noted. Loop recorder is again seen. Lungs are clear. No acute bony abnormality is noted. IMPRESSION: Stable  cardiomegaly. No other focal abnormality is noted. Electronically Signed   By: Inez Catalina M.D.   On: 08/22/2021 03:28      [name], MD Internal Medicine Resident, PGY-1 Pager: [phone]

## 2021-08-31 NOTE — TOC Transition Note (Signed)
Transition of Care (TOC) - CM/SW Discharge Note Marvetta Gibbons RN, BSN Transitions of Care Unit 4E- RN Case Manager See Treatment Team for direct phone #  Weekend cross coverage  Patient Details  Name: [name] MRN: [id number] Date of Birth: 14-Feb-1946  Transition of Care [facility]) CM/SW Contact:  Dawayne Patricia, RN Phone Number: 08/31/2021, 12:31 PM   Clinical Narrative:    Pt stable for transition home today, HHRN/PT services have been set up with [facility] per previous CM note.  Msg sent to [facility] with Alvis Lemmings to confirm and to notify for start of care. [facility] to contact pt within 48 post discharge to schedule initial visit.   DME- tub bench to have been delivered to room by [employer].    Final next level of care: [place] Barriers to Discharge: Barriers Resolved   Patient Goals and CMS Choice Patient states their goals for this hospitalization and ongoing recovery are:: return home with wife CMS Medicare.gov Compare Post Acute Care list provided to:: Patient Choice offered to / list presented to : Patient  Discharge Placement                 Home w/ [facility]      Discharge Plan and Services   Discharge Planning Services: CM Consult Post Acute Care Choice: Home Health          DME Arranged: Tub bench DME Agency: [facility] Date DME Agency Contacted: 08/29/21   Representative spoke with at DME Agency: Freda Munro HH Arranged: RN, Disease Management, PT [place] Agency: [place] Date [place]: 08/31/21 Time [place]: 61 Representative spoke with at [place]: [place] ([place]) Interventions     Readmission Risk Interventions Readmission Risk Prevention Plan 08/24/2021 05/17/2019 02/13/2019  Transportation Screening Complete Complete Complete  PCP or Specialist Appt within 3-5 Days Complete - -  HRI or Home Care Consult Complete - -  Social Work Consult for [place]  Planning/Counseling Complete - -  Palliative Care Screening Not Applicable - -  Medication Review Press photographer) Complete Complete Complete  PCP or Specialist appointment within 3-5 days of discharge - Patient refused Complete  HRI or [place] - Patient refused Complete  SW Recovery Care/Counseling Consult - - Complete  Palliative Care Screening - - Not [place] - Not Applicable Not Applicable  Some recent data might be hidden

## 2021-09-01 ENCOUNTER — Other Ambulatory Visit: Payer: Self-pay | Admitting: Internal Medicine

## 2021-09-01 ENCOUNTER — Telehealth: Payer: Self-pay | Admitting: Internal Medicine

## 2021-09-01 NOTE — Telephone Encounter (Signed)
TOC HFU APPT  Name: [name], [name] MRN: [id number]  Date: 09/04/2021 Status: Sch  Time: 2:45 PM Length: 30  Visit Type: OPEN ESTABLISHED [726] Copay: $0.00  Provider: [name], MD Department: [facility]  Referring Provider: Marianna Payment

## 2021-09-02 ENCOUNTER — Telehealth (HOSPITAL_COMMUNITY): Payer: Self-pay

## 2021-09-02 NOTE — Telephone Encounter (Signed)
Heart Failure Nurse Navigator Progress Note  HV TOC follow up requested by Mickel Baas, NP Cardiology. Pt was seen by HV TOC clinic 09/2020 after hospitalization for CHF symptoms. Pt recently discharged 2/12 for a/c CHF. Offered HV TOC clinic appt Tuesday 2/21 @ noon for follow up with intention to return to Dr. Tamala Julian. Pt spouse agreeable to appt time.  Confirmed transportation and appointment time of 2/21 @ 12pm with directions to clinic.   Pricilla Holm, MSN, RN Heart Failure Nurse Navigator [phone]

## 2021-09-03 MED ORDER — FUROSEMIDE 40 MG PO TABS: 40.0000 mg | ORAL_TABLET | Freq: Two times a day (BID) | ORAL | 0 refills | Status: DC

## 2021-09-04 ENCOUNTER — Ambulatory Visit (INDEPENDENT_AMBULATORY_CARE_PROVIDER_SITE_OTHER): Payer: Medicare Other | Admitting: Student

## 2021-09-04 VITALS — BP 115/71 | HR 97 | Ht 66.0 in | Wt 200.0 lb

## 2021-09-04 DIAGNOSIS — I110 Hypertensive heart disease with heart failure: Secondary | ICD-10-CM | POA: Diagnosis not present

## 2021-09-04 DIAGNOSIS — I10 Essential (primary) hypertension: Secondary | ICD-10-CM

## 2021-09-04 DIAGNOSIS — I4819 Other persistent atrial fibrillation: Secondary | ICD-10-CM | POA: Diagnosis not present

## 2021-09-04 DIAGNOSIS — I872 Venous insufficiency (chronic) (peripheral): Secondary | ICD-10-CM

## 2021-09-04 DIAGNOSIS — I5042 Chronic combined systolic (congestive) and diastolic (congestive) heart failure: Secondary | ICD-10-CM | POA: Diagnosis not present

## 2021-09-04 MED ORDER — HYDROCORTISONE 1 % EX CREA
1.0000 | TOPICAL_CREAM | Freq: Two times a day (BID) | CUTANEOUS | 2 refills | Status: DC
Start: 2021-09-04 — End: 2022-01-05

## 2021-09-04 MED ORDER — METOPROLOL SUCCINATE ER 25 MG PO TB24: 50.0000 mg | ORAL_TABLET | Freq: Every day | ORAL | 3 refills | Status: DC

## 2021-09-04 NOTE — Patient Instructions (Signed)
Thank you, [name] for allowing [place] to provide your care today. Today we discussed your recent hospitalization for your heart failure.  I am glad you are doing better.  Continue taking all your medications as prescribed and follow-up with cardiology on Monday.  I have ordered the following labs for you:  Lab Orders         BMP8+Anion Gap     I will call if any are abnormal. All of your labs can be accessed through "My Chart".  I have ordered the following medication/changed the following medications:  Hydrocortisone cream 1%, apply to legs twice daily for the itching Increase metoprolol to 50 mg daily  My Chart Access: [email or web address]  Please follow-up with PCP in 1-3 months  Please make sure to arrive 15 minutes prior to your next appointment. If you arrive late, you may be asked to reschedule.    We look forward to seeing you next time. Please call our clinic at [phone] if you have any questions or concerns. The best time to call is Monday-Friday from 9am-4pm, but there is someone available 24/7. If after hours or the weekend, call the main hospital number and ask for the Internal Medicine Resident On-Call. If you need medication refills, please notify your pharmacy one week in advance and they will send [place] a request.   Thank you for letting us take part in your care. Wishing you the best!  [name], MD 09/04/2021, 4:04 PM IM Resident, PGY-2 [place]al[place] Milian 41:10

## 2021-09-04 NOTE — Progress Notes (Signed)
° °  CC: Hospital follow-up  HPI:  [name] is a 76 y.o. male with PMH as below who presents to clinic today accompanied by family for hospital follow-up after being hospitalized for heart failure exacerbation. Please see problem based charting for evaluation, assessment and plan.  Past Medical History:  Diagnosis Date   Acute lower GI bleeding 02/15/2017   Atrial fibrillation (HCC)    Cardiomyopathy    EF55% 11/14<<35%    CHF (congestive heart failure) (HCC)    COPD (chronic obstructive pulmonary disease) ([place])    Difficult airway for intubation    per telephone encounter notation   Essential hypertension    GERD (gastroesophageal reflux disease)    Gout    Hepatitis    Possible history   History of atrial flutter    Ablation 2012   Myeloproliferative neoplasm ([place]) 08/15/2013   Obesity    Pneumonia X 1   Primary polycythemia ([place]) 06/12/2013   Renal insufficiency    Substance abuse ([place])    History of alcohol; hx cocaine, heroin, crack use   Syncope    Tubular adenoma of colon 08/2014    Review of Systems:  Constitutional: Positive for fatigue. Respiratory: Positive for dyspnea on exertion Cardiac: Negative for chest pain or palpitations MSK: Positive for chronic pain Skin: Positive for itchy lower extremity rash Neuro: Negative for headache, dizziness or weakness  Physical Exam: General: Pleasant, chronically ill elderly man in wheelchair. No acute distress. Neck: Supple.  No JVD. Cardiac: Mildly tachycardic. Irregularly irregular rhythm. No murmurs, rubs or gallops. 1+ BLE edema Respiratory: On room air. Lungs CTAB. No wheezing or crackles. Abdominal: Soft, symmetric and non tender. Normal BS. Skin: Warm, dry. BLE w/ chronic venous stasis changes with multiple pruritic scabs and ulcerations. Neuro: A&O x 3.  Normal sensation to gross touch. Psych: Appropriate mood and affect.  Vitals:   09/04/21 1459  BP: 115/71  Pulse: 97  SpO2: 100%   Weight: 200 lb (90.7 kg)  Height: 5\' 6"  (1.676 m)     Assessment & Plan:   See Encounters Tab for problem based charting.  Patient discussed with Dr. Dani Gobble, MD, MPH

## 2021-09-05 LAB — BMP8+ANION GAP
Anion Gap: 17 mmol/L (ref 10.0–18.0)
BUN/Creatinine Ratio: 22 (ref 10–24)
BUN: 39 mg/dL — ABNORMAL HIGH (ref 8–27)
CO2: 22 mmol/L (ref 20–29)
Calcium: 8.8 mg/dL (ref 8.6–10.2)
Chloride: 102 mmol/L (ref 96–106)
Creatinine, Ser: 1.8 mg/dL — ABNORMAL HIGH (ref 0.76–1.27)
Glucose: 104 mg/dL — ABNORMAL HIGH (ref 70–99)
Potassium: 4.5 mmol/L (ref 3.5–5.2)
Sodium: 141 mmol/L (ref 134–144)
eGFR: 39 mL/min/{1.73_m2} — ABNORMAL LOW (ref >59)

## 2021-09-06 ENCOUNTER — Encounter: Payer: Self-pay | Admitting: Student

## 2021-09-06 NOTE — Assessment & Plan Note (Addendum)
Patient here for hospital follow-up after being hospitalized for heart failure exacerbation from 2/3 to 3/12.  Patient was found to have an EF decrease from 40-45% in March 2022 to 35-40% during hospitalization.  Patient was diuresed with IV Lasix and discharged home on Lasix 40 mg twice daily.  Goal-directed medical therapy was not started due to soft blood pressures. He was discharged home on only metoprolol XL 25 mg daily. Since discharge, patient states his dyspnea on exertion has been improving. He has been adherent to his Lasix therapy and has noticed some improvement in his lower extremity swelling.  Weight is down more than 10 lbs since discharge. On exam, patient has no JVD or rales but has  1+ BLE pitting edema. BMP shows improving kidney function and no electrolyte abnormalities. Plan to increase patient's metoprolol to 50 mg daily to help improve this mild tachycardia and help improve his cardiac output. Patient is scheduled to follow-up with cardiology on 2/20. GDMT will need to be maximized as tolerated by patient's BP.  Plan: --Continue Lasix 40 mg twice daily -- Increase metoprolol XL to 50 mg daily -- Follow-up with cardiology on 2/20 -- Follow-up with PCP as needed.

## 2021-09-06 NOTE — Assessment & Plan Note (Signed)
Patient's low BP limited initiation of goal-directed medical therapy during recent hospitalization. Patient discharged home on metoprolol XL 25 mg daily. BP today is stable at 115/71. Patient reports some dyspnea on exertion but denies any dizziness, headaches, blurry vision or chest pain. We will increase patient's metoprolol dose to 50 mg to improve his mild tachycardia and watch BP closely. Patient will follow-up with cardiology 2/20. BMP shows improving kidney function with creatinine down to 1.80 from 2.02 on day of discharge.  No electrolyte abnormalities.  Plan: -- Increase metoprolol from 25 to 50 mg daily

## 2021-09-06 NOTE — Assessment & Plan Note (Signed)
Patient has a history of chronic venous stasis dermatitis.  Patient and family states that patient's lower extremities has been a change and patient has been scratching them multiple times leading to multiple ulcerations on top of the dermatitis.  On exam, patient BLE has venous stasis changes with multiple scabs and ulcerations.  Will prescribe hydrocortisone cream and monitor for symptomatic improvement.  Plan: -- Hydrocortisone 1% cream, apply to legs twice daily

## 2021-09-06 NOTE — Progress Notes (Signed)
Internal Medicine Clinic Attending  Case discussed with Dr. Coy Saunas  At the time of the visit.  We reviewed the residents history and exam and pertinent patient test results.  I agree with the assessment, diagnosis, and plan of care documented in the residents note.   Patient with HFrEF here for post-hospital follow-up after admission for CHF exacerbation. He's doing well today. I'm not sure that 10 lb weight loss is accurate, or if that just reflects standing weight here compared to bed weight in the hospital. Either way, the lasix 40mg  BID seems to be working well for him, and his labs look good, so we'll keep going with this. Will increase Metoprolol XL from 25 to 50mg  daily. He has appointment with Cardiology - Cards, can consider if he has room to add [facility]

## 2021-09-06 NOTE — Assessment & Plan Note (Signed)
Patient remains in A-fib with irregularly irregular rhythm and mild tachycardia of 97.  He reports some dyspnea on exertion but denies any lightheadedness, syncope, palpitations or chest pain.  Plans to follow-up with cardiology on Monday.  Plan: -- Continue amiodarone 400 mg twice a day -- Increase metoprolol from 25 to 50 mg daily -- Continue Eliquis 5 mg twice daily -- Follow-up with cardiology on 2/20

## 2021-09-09 ENCOUNTER — Telehealth (HOSPITAL_COMMUNITY): Payer: Self-pay

## 2021-09-09 ENCOUNTER — Other Ambulatory Visit (HOSPITAL_COMMUNITY): Payer: Self-pay

## 2021-09-09 ENCOUNTER — Other Ambulatory Visit: Payer: Self-pay

## 2021-09-09 ENCOUNTER — Ambulatory Visit (HOSPITAL_COMMUNITY)
Admission: RE | Admit: 2021-09-09 | Discharge: 2021-09-09 | Disposition: A | Discharge status: Home / Self Care | Payer: Medicare Other | Source: Ambulatory Visit | Attending: Cardiology | Admitting: Cardiology

## 2021-09-09 VITALS — HR 94 | Wt 212.0 lb

## 2021-09-09 DIAGNOSIS — G8929 Other chronic pain: Secondary | ICD-10-CM | POA: Insufficient documentation

## 2021-09-09 DIAGNOSIS — E669 Obesity, unspecified: Secondary | ICD-10-CM | POA: Diagnosis not present

## 2021-09-09 DIAGNOSIS — Z7901 Long term (current) use of anticoagulants: Secondary | ICD-10-CM | POA: Diagnosis not present

## 2021-09-09 DIAGNOSIS — Z79899 Other long term (current) drug therapy: Secondary | ICD-10-CM | POA: Diagnosis not present

## 2021-09-09 DIAGNOSIS — I130 Hypertensive heart and chronic kidney disease with heart failure and stage 1 through stage 4 chronic kidney disease, or unspecified chronic kidney disease: Secondary | ICD-10-CM | POA: Diagnosis present

## 2021-09-09 DIAGNOSIS — I340 Nonrheumatic mitral (valve) insufficiency: Secondary | ICD-10-CM | POA: Diagnosis not present

## 2021-09-09 DIAGNOSIS — I5042 Chronic combined systolic (congestive) and diastolic (congestive) heart failure: Secondary | ICD-10-CM

## 2021-09-09 DIAGNOSIS — D45 Polycythemia vera: Secondary | ICD-10-CM | POA: Insufficient documentation

## 2021-09-09 DIAGNOSIS — F1910 Other psychoactive substance abuse, uncomplicated: Secondary | ICD-10-CM | POA: Insufficient documentation

## 2021-09-09 DIAGNOSIS — J449 Chronic obstructive pulmonary disease, unspecified: Secondary | ICD-10-CM | POA: Insufficient documentation

## 2021-09-09 DIAGNOSIS — D469 Myelodysplastic syndrome, unspecified: Secondary | ICD-10-CM | POA: Insufficient documentation

## 2021-09-09 DIAGNOSIS — I484 Atypical atrial flutter: Secondary | ICD-10-CM | POA: Insufficient documentation

## 2021-09-09 DIAGNOSIS — I2510 Atherosclerotic heart disease of native coronary artery without angina pectoris: Secondary | ICD-10-CM | POA: Diagnosis not present

## 2021-09-09 DIAGNOSIS — N1832 Chronic kidney disease, stage 3b: Secondary | ICD-10-CM | POA: Diagnosis not present

## 2021-09-09 DIAGNOSIS — I083 Combined rheumatic disorders of mitral, aortic and tricuspid valves: Secondary | ICD-10-CM | POA: Diagnosis not present

## 2021-09-09 DIAGNOSIS — Z7984 Long term (current) use of oral hypoglycemic drugs: Secondary | ICD-10-CM | POA: Diagnosis not present

## 2021-09-09 DIAGNOSIS — I252 Old myocardial infarction: Secondary | ICD-10-CM | POA: Insufficient documentation

## 2021-09-09 DIAGNOSIS — Z955 Presence of coronary angioplasty implant and graft: Secondary | ICD-10-CM | POA: Insufficient documentation

## 2021-09-09 DIAGNOSIS — I5043 Acute on chronic combined systolic (congestive) and diastolic (congestive) heart failure: Secondary | ICD-10-CM | POA: Diagnosis not present

## 2021-09-09 DIAGNOSIS — I719 Aortic aneurysm of unspecified site, without rupture: Secondary | ICD-10-CM | POA: Insufficient documentation

## 2021-09-09 DIAGNOSIS — F17210 Nicotine dependence, cigarettes, uncomplicated: Secondary | ICD-10-CM | POA: Insufficient documentation

## 2021-09-09 DIAGNOSIS — I4819 Other persistent atrial fibrillation: Secondary | ICD-10-CM | POA: Insufficient documentation

## 2021-09-09 DIAGNOSIS — I428 Other cardiomyopathies: Secondary | ICD-10-CM | POA: Insufficient documentation

## 2021-09-09 DIAGNOSIS — R400 Somnolence: Secondary | ICD-10-CM | POA: Diagnosis not present

## 2021-09-09 LAB — BASIC METABOLIC PANEL
Anion gap: 11 (ref 5–15)
BUN: 42 mg/dL — ABNORMAL HIGH (ref 8–23)
CO2: 27 mmol/L (ref 22–32)
Calcium: 8.6 mg/dL — ABNORMAL LOW (ref 8.9–10.3)
Chloride: 103 mmol/L (ref 98–111)
Creatinine, Ser: 1.88 mg/dL — ABNORMAL HIGH (ref 0.61–1.24)
GFR, Estimated: 37 mL/min — ABNORMAL LOW (ref >60)
Glucose, Bld: 134 mg/dL — ABNORMAL HIGH (ref 70–99)
Potassium: 3.3 mmol/L — ABNORMAL LOW (ref 3.5–5.1)
Sodium: 141 mmol/L (ref 135–145)

## 2021-09-09 LAB — BRAIN NATRIURETIC PEPTIDE: B Natriuretic Peptide: 1027.6 pg/mL — ABNORMAL HIGH (ref 0.0–100.0)

## 2021-09-09 MED ORDER — POTASSIUM CHLORIDE CRYS ER 20 MEQ PO TBCR: 20.0000 meq | EXTENDED_RELEASE_TABLET | Freq: Every day | ORAL | 2 refills | Status: DC

## 2021-09-09 MED ORDER — DAPAGLIFLOZIN PROPANEDIOL 10 MG PO TABS: 10.0000 mg | ORAL_TABLET | Freq: Every day | ORAL | 3 refills | Status: AC

## 2021-09-09 MED ORDER — TORSEMIDE 20 MG PO TABS: 40.0000 mg | ORAL_TABLET | Freq: Two times a day (BID) | ORAL | 3 refills | Status: DC

## 2021-09-09 NOTE — Addendum Note (Signed)
Encounter addended by: Consuelo Pandy, PA-C on: 09/09/2021 3:41 PM  Actions taken: Clinical Note Signed

## 2021-09-09 NOTE — Addendum Note (Signed)
Encounter addended by: Consuelo Pandy, PA-C on: 09/09/2021 9:36 PM  Actions taken: Clinical Note Signed

## 2021-09-09 NOTE — Patient Instructions (Addendum)
Stop Lasix.  Start Torsemide 40 mg Daily.  Start Farxgia 10 mg Daily.  Labs done today, your results will be available in MyChart, we will contact you for abnormal readings.   Thank you for allowing [place] to provider your heart failure care after your recent hospitalization. Please follow-up in 4 weeks  If you have any questions, issues, or concerns before your next appointment please call our office at [phone], opt. [phone] and leave a message for the triage nurse.

## 2021-09-09 NOTE — Addendum Note (Signed)
Encounter addended by: Jerl Mina, RN on: 09/09/2021 3:40 PM  Actions taken: Diagnosis association updated, Pharmacy for encounter modified, Order list changed

## 2021-09-09 NOTE — Telephone Encounter (Addendum)
Pt aware, agreeable, and verbalized understanding  New prescription sent to pharmacy.  ----- Message from Consuelo Pandy, PA-C sent at 09/09/2021  3:08 PM EST ----- Renal fx stable. K low. Diuretics were adjusted today.  Add KCl 20 meQ daily.   Also instruct to reduce amiodarone to 200 mg bid.   F/u BMP in 1 week.   Refer to [facility] for MitraClip evaluation

## 2021-09-09 NOTE — Progress Notes (Addendum)
HEART & VASCULAR TRANSITION OF CARE CONSULT NOTE     Referring Physician: Dr. Percival Spanish   Primary Care: Marianna Payment, MD  Primary Cardiologist: Sinclair Grooms, MD   HPI: Referred to clinic by Dr. Percival Spanish for heart failure consultation.   [name] is a 76 y.o.  male  with a PMH significant for COPD, CAD s/p PCI to mid LAD 09/2020, atrial flutter s/p ablation 2012, MDS, HTN, CKD3a, Tobacco use disorder, polycythemia vera, and chronic pain on suboxone.    Cardiac history dating back to 2012 diagnosed with combined CHF with EF 35%, initially felt to be non-ischemic in the setting of polysubstance abuse and atrial flutter. He underwent an atrial flutter ablation in 2012. EF reportedly improved to 55% in 2014, then back down again in 2020 to 35-40%. Has struggled to maintain NSR on amiodarone with break through afib after flutter ablation.   Multiple hospitalizations for respiratory failure since 2014.  Has very bad COPD.  Required intubation in July 2020 and September 2020 underwent diuresis and was extubated and hospitalized again in November 2021 but did not require intubation. PFTs in 2020 c/w moderate OSA. Continues to smoke cigarrettes.   He was admitted last year, 09/2020, with chest pain/NSTEMI and dyspnea found to have single vessel LAD disease and received PCI to mid LAD. Echo w/ moderately reduced LVEF, 40-45%, mod-severe MR and normal RV. He was diuresed and discharged from the hospital on metoprolol, lasix, farxiga, amiodarone, eliquis and dapt. He was referred to [facility] and saw Dr. Shan Levans but ultimately referred back to cardiology.   He was recently readmitted earlier this month, 1/66, w/ a/c systolic heart failure. He was back in afib. Hs trop 19>>30>>31. UDS was negative. Echo w/ EF 35-40%, RV normal. Severe MR noted.  ERO 0.34 cm2. LA mildly dilated. Also w/ moderate AI and severe TR. He was diuresed w/ IV Lasix and noted some symptomatic improving, however  cardiology note the day he was discharged noted that he was still "quite volume overloaded". He was discharged home on Lasix 40 mg bid. Referred to TOC.   He presents to clinic today w/ his wife. He is in a WC and also has a cane. Poor functional status at baseline. Wife does most of house hold duties. No resting dyspnea but gets SOB walking to mailbox. He is fluid overloaded on exam w/ noted abdominal and peripheral edema. Reports full med compliance but suboptimal response to PO Lasix. Remains in rate controlled AF. BP is well controlled. Trying to adhere to low sodium diet and fluid restrict. Wife prepares meals. His wife notes daytime somnolence and he falls asleep easily, and dozed off multiple times during examination. Wife denies snoring but has never had sleep study.     Cardiac Testing   1. Left ventricular ejection fraction, by estimation, is 35 to 40%. The  left ventricle has moderately decreased function. The left ventricle  demonstrates global hypokinesis. There is moderate left ventricular  hypertrophy. Left ventricular diastolic  parameters are indeterminate.   2. Right ventricular systolic function is normal. The right ventricular  size is normal. There is normal pulmonary artery systolic pressure.   3. Right atrial size was mildly dilated.   4. PISA 0.8 cm      ERO 0.34 cm2      MR Vol 76ml. The mitral valve is normal in structure. Severe mitral  valve regurgitation. No evidence of mitral stenosis.   5. Tricuspid valve regurgitation is severe.  6. The aortic valve is tricuspid. Aortic valve regurgitation is moderate.  No aortic stenosis is present.   7. Aortic dilatation noted. There is moderate dilatation of the ascending  aorta, measuring 49 mm.   8. The inferior vena cava is dilated in size with <50% respiratory  variability, suggesting right atrial pressure of 15 mmHg.   Review of Systems: [y] = yes, [ ]  = no   General: Weight gain [ ] ; Weight loss [ ] ; Anorexia [  ]; Fatigue [ ] ; Fever [ ] ; Chills [ ] ; Weakness [ ]   Cardiac: Chest pain/pressure [ ] ; Resting SOB [ ] ; Exertional SOB [ Y]; Orthopnea [ ] ; Pedal Edema [Y ]; Palpitations [ ] ; Syncope [ ] ; Presyncope [ ] ; Paroxysmal nocturnal dyspnea[ ]   Pulmonary: Cough [ ] ; Wheezing[ ] ; Hemoptysis[ ] ; Sputum [ ] ; Snoring [ ]   GI: Vomiting[ ] ; Dysphagia[ ] ; Melena[ ] ; Hematochezia [ ] ; Heartburn[ ] ; Abdominal pain [ ] ; Constipation [ ] ; Diarrhea [ ] ; BRBPR [ ]   GU: Hematuria[ ] ; Dysuria [ ] ; Nocturia[ ]   Vascular: Pain in legs with walking [ ] ; Pain in feet with lying flat [ ] ; Non-healing sores [ ] ; Stroke [ ] ; TIA [ ] ; Slurred speech [ ] ;  Neuro: Headaches[ ] ; Vertigo[ ] ; Seizures[ ] ; Paresthesias[ ] ;Blurred vision [ ] ; Diplopia [ ] ; Vision changes [ ]   Ortho/Skin: Arthritis [ ] ; Joint pain [ ] ; Muscle pain [ ] ; Joint swelling [ ] ; Back Pain [ ] ; Rash [ ]   Psych: Depression[ ] ; Anxiety[ ]   Heme: Bleeding problems [ ] ; Clotting disorders [ ] ; Anemia [ ]   Endocrine: Diabetes [ ] ; Thyroid dysfunction[ ]    Past Medical History:  Diagnosis Date   Acute lower GI bleeding 02/15/2017   Atrial fibrillation (HCC)    Cardiomyopathy    EF55% 11/14<<35%    CHF (congestive heart failure) (HCC)    COPD (chronic obstructive pulmonary disease) ([place])    Difficult airway for intubation    per telephone encounter notation   Essential hypertension    GERD (gastroesophageal reflux disease)    Gout    Hepatitis    Possible history   History of atrial flutter    Ablation 2012   Myeloproliferative neoplasm ([place]) 08/15/2013   Obesity    Pneumonia X 1   Primary polycythemia ([place]) 06/12/2013   Renal insufficiency    Substance abuse ([place])    History of alcohol; hx cocaine, heroin, crack use   Syncope    Tubular adenoma of colon 08/2014    Current Outpatient Medications  Medication Sig Dispense Refill   acetaminophen (TYLENOL) 500 MG tablet Take 1,000 mg by mouth every 6 (six) hours as needed for mild pain.      albuterol (PROAIR HFA) 108 (90 Base) MCG/ACT inhaler Inhale 1-2 puffs into the lungs every 6 (six) hours as needed for wheezing or shortness of breath. 18 g 3   amiodarone (PACERONE) 400 MG tablet Take 1 tablet (400 mg total) by mouth 2 (two) times daily. 30 tablet 0   apixaban (ELIQUIS) 5 MG TABS tablet Take 1 tablet (5 mg total) by mouth 2 (two) times daily. 60 tablet 0   atorvastatin (LIPITOR) 80 MG tablet Take 1 tablet (80 mg total) by mouth daily. 30 tablet 5   camphor-menthol (SARNA) lotion Apply topically 3 (three) times daily. 222 mL 0   clopidogrel (PLAVIX) 75 MG tablet TAKE 1 TABLET(75 MG) BY MOUTH DAILY 90 tablet 3   dapagliflozin propanediol (FARXIGA) 10 MG TABS  tablet Take 1 tablet (10 mg total) by mouth daily before breakfast. 90 tablet 3   diclofenac Sodium (VOLTAREN) 1 % GEL Apply 2 g topically 4 (four) times daily as needed (as needed for pain). 2 g 0   hydrocortisone cream 1 % Apply 1 application topically 2 (two) times daily. 30 g 2   metoprolol succinate (TOPROL-XL) 25 MG 24 hr tablet Take 2 tablets (50 mg total) by mouth daily. 90 tablet 3   senna-docusate (SENOKOT-S) 8.6-50 MG tablet Take 1 tablet by mouth at bedtime as needed for mild constipation. 30 tablet 0   SUBOXONE 8-2 MG FILM Place 2 Film under the tongue daily.     SYMBICORT 160-4.5 MCG/ACT inhaler Inhale 2 puffs into the lungs at bedtime. 1 each 0   torsemide (DEMADEX) 20 MG tablet Take 2 tablets (40 mg total) by mouth 2 (two) times daily. 180 tablet 3   No current facility-administered medications for this encounter.    No Known Allergies    Social History   Socioeconomic History   Marital status: Married    Spouse name: [name]   Number of children: 1   Years of education: Not on file   Highest education level: 11th grade  Occupational History   Occupation: Retired  Tobacco Use   Smoking status: Some Days    Packs/day: 1.50    Years: 50.00    Pack years: 75.00    Types: Cigarettes   Smokeless  tobacco: Never   Tobacco comments:    1-2 cigarettes a day for a couple years now. encouraged cessation  Vaping Use   Vaping Use: Never used  Substance and Sexual Activity   Alcohol use: Yes    Alcohol/week: 2.0 standard drinks    Types: 2 Shots of liquor per week    Comment: Sometimes.   Drug use: Not Currently    Types: Heroin    Comment: stopped use in 2015. currently on suboxone   Sexual activity: Not Currently  Other Topics Concern   Not on file  Social History Narrative   Moved here from [place]. Lives with common law wife and grandchildren. 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."   Social Determinants of Radio broadcast assistant Strain: Low Risk    Difficulty of Paying Living Expenses: Not hard at all  Food Insecurity: No Food Insecurity   Worried About Charity fundraiser in the Last Year: Never true   Arboriculturist in the Last Year: Never true  Transportation Needs: No Transportation Needs   Lack of Transportation (Medical): No   Lack of Transportation (Non-Medical): No  Physical Activity: Not on file  Stress: Not on file  Social Connections: Not on file  Intimate Partner Violence: Not on file      Family History  Problem Relation Age of Onset   Diabetes Mother    Hypertension Mother    Cirrhosis Father    Alcohol abuse Father    Colon cancer Neg Hx    Rectal cancer Neg Hx    Stomach cancer Neg Hx    Esophageal cancer Neg Hx    Colon polyps Neg Hx     Vitals:   09/09/21 1200  Pulse: 94  SpO2: 92%  Weight: 96.2 kg    PHYSICAL EXAM: General:  chronically ill appearing, obese, in WC. No respiratory difficulty HEENT: normal Neck: supple. JVD elevated to jaw. Carotids 2+ bilat; no bruits. No lymphadenopathy or thryomegaly appreciated. Cor: PMI nondisplaced. Irregularly irregular  rhythm. + MR and TR murmurs  Lungs: decreased BS at the bases bilaterally  Abdomen: obese and distended/edematous, non tender. No hepatosplenomegaly. No bruits or masses.  Good bowel sounds. Extremities: no cyanosis, clubbing, rash, 1+ b/l LE edema Neuro: alert & oriented x 3, cranial nerves grossly intact. moves all 4 extremities w/o difficulty. Affect pleasant.  ECG: Atrial fibrillation 84 bpm, personally reviewed    ASSESSMENT & PLAN:  1. 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 - EF normalized post AFL Ablaton, Echo 2014 EF 55% - EF in 2020  35-40% - Echo 09/2020 EF 40-45% after NSTEMI, pLAD occlusion>>PCI  - Echo 08/2021 EF 35-40% in the setting of recurrent Atrial fibrillation - Fluid overloaded w/ NYHA Class III symptoms. Notable abdominal edema, poor response to PO Lasix - Stop Lasix - Start Torsemide 40 mg daily  - Add Farxiga 10 mg daily (Hgb A1c 6.0) - No Entresto/Spiro w/ baseline SCr ~2.0  - Continue Toprol XL 50 mg daily  - Discussed low salt diet and fluid restriction  - will ultimately need DCCV to help restore NSR once better diuresed   2. Persistent Atrial Fibrillation/ H/o Atrial Flutter - remote AFL ablation  - now in rate controlled Afib  - suspect MR contributing factor  - on Toprol XL + amiodarone. Reduce amio to 200 mg bid.  - on Eliquis 5 mg bid  - cardiology to plan outpatient DCCV after 3 weeks of a/c - I think he would benefit from further diuresis prior to attempt. See plan above  - needs sleep study to r/o OSA   3. Mod-Severe Mitral Regurgitation/ Severe Tricuspid Regurgitation  - mitral valve is normal in structure on recent TTE, w/ moderate MR, PISA 0.8 cm ERO 0.34 cm2, RVOL 0.34 c/w 3+ MR. LVEF 35-40%  - will need optimization of HF (see plan above) - not candidate for surgical repair given age and co morbidities including moderate COPD  - refer to structural Heart for MitraClip evaluation  - also ? percutaneous clip candidate for TR   4. CAD - NSTEMI 3/22 w/ severe single vessel  CAD, pLAD stenosis treated w/ PCI +DES - stable w/o CP  - continue medical management per cardiology   5. Hypertension  - controlled on current regimen   6. CKD Stage IIIb - baseline SCr 1.8-2.0 - BMP today w/ diuretic dose adjustment   7. Suspected OSA - daytime somnolence, easily falls asleep - recommend sleep study   8. Aortic Aneurysm/ Moderate AI  - mod dilatation of the AA, measuring 49 mm - monitoring per cardiology   NYHA III GDMT  Diuretic- Torsemide 40 mg daily  BB- Toprol XL 50 mg daily  Ace/ARB/ARNI No CKD  MRA no CKD SGLT2i Farxiga 10 mg daily     Referred to HFSW (PCP, Medications, Transportation, ETOH Abuse, Drug Abuse, Insurance, Museum/gallery curator ):  No Refer to Pharmacy:  No Refer to Home Health: No Refer to Advanced Heart Failure Clinic: No Refer to General Cardiology: Yes (follows w/ Dr. Tamala Julian)  Follow up: I don't think he is a good candidate for advanced heart failure therapies (LVAD, transplant) but suspect he may need MitraClip for MR. Will bring back to [facility] clinic to help w/ fluid optimization. Diuretics have been adjusted today. GDMT will be limited by CKD. Refer to [facility]. Bring back to [facility] for f/u  in 3-4 weeks. Plan to f/u w/ Dr. Tamala Julian for further cardiac management thereafter.

## 2021-09-09 NOTE — Telephone Encounter (Signed)
Heart Failure Nurse Navigator Progress Note  Called to confirm Heart & Vascular Transitions of Care appointment at noon today 2/21. Patient reminded to bring all medications and pill box organizer with them. Confirmed patient has transportation. Gave directions, instructed to utilize [place] parking.  Confirmed appointment prior to ending call.   Pricilla Holm, MSN, RN Heart Failure Nurse Navigator [phone]

## 2021-09-16 ENCOUNTER — Other Ambulatory Visit: Payer: Self-pay

## 2021-09-16 ENCOUNTER — Ambulatory Visit (HOSPITAL_COMMUNITY)
Admission: RE | Admit: 2021-09-16 | Discharge: 2021-09-16 | Disposition: A | Discharge status: Home / Self Care | Payer: Medicare Other | Source: Ambulatory Visit | Attending: Cardiology | Admitting: Cardiology

## 2021-09-16 DIAGNOSIS — I5042 Chronic combined systolic (congestive) and diastolic (congestive) heart failure: Secondary | ICD-10-CM | POA: Insufficient documentation

## 2021-09-16 LAB — BASIC METABOLIC PANEL
Anion gap: 11 (ref 5–15)
BUN: 35 mg/dL — ABNORMAL HIGH (ref 8–23)
CO2: 28 mmol/L (ref 22–32)
Calcium: 9 mg/dL (ref 8.9–10.3)
Chloride: 102 mmol/L (ref 98–111)
Creatinine, Ser: 2.06 mg/dL — ABNORMAL HIGH (ref 0.61–1.24)
GFR, Estimated: 33 mL/min — ABNORMAL LOW (ref >60)
Glucose, Bld: 144 mg/dL — ABNORMAL HIGH (ref 70–99)
Potassium: 4 mmol/L (ref 3.5–5.1)
Sodium: 141 mmol/L (ref 135–145)

## 2021-09-16 NOTE — Progress Notes (Unsigned)
Patient ID: [name] MRN: [id number] DOB/AGE: 09-14-1945 76 y.o.  Primary Care Physician:Coe, [name], MD Primary Cardiologist: ***   FOCUSED CARDIOVASCULAR PROBLEM LIST:  ***    HISTORY OF PRESENT ILLNESS: The patient is a 76 y.o. 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   Obesity    Pneumonia X 1   Primary polycythemia ([place]) 06/12/2013   Renal insufficiency    Substance abuse ([place])    History of alcohol; hx cocaine, heroin, crack use   Syncope    Tubular adenoma of colon 08/2014    Past Surgical History:  Procedure Laterality Date   CARDIAC ELECTROPHYSIOLOGY MAPPING AND ABLATION  08/2010   [name] 09/07/2010 (12/14/2012)   COLONOSCOPY     15-20 years ago had colon in [place] N/A 02/12/2020   Procedure: COLONOSCOPY WITH PROPOFOL;  Surgeon: Ladene Artist, MD;  Location: [facility];  Service: Endoscopy;  Laterality: N/A;   CORONARY STENT INTERVENTION N/A 10/10/2020   Procedure: CORONARY STENT INTERVENTION;  Surgeon: Lorretta Harp, MD;  Location: [facility] CV [facility];  Service: Cardiovascular;  Laterality: N/A;   EXCISIONAL HEMORRHOIDECTOMY  1970's   LOOP RECORDER IMPLANT N/A 08/23/2013   Procedure: LOOP RECORDER IMPLANT;  Surgeon: Deboraha Sprang, MD;  Location: [facility] CATH LAB;  Service: Cardiovascular;  Laterality: N/A;   MULTIPLE EXTRACTIONS WITH ALVEOLOPLASTY Bilateral 01/24/2016   Procedure: MULTIPLE EXTRACTION  WITH ALVEOLOPLASTY BILATERAL;  Surgeon: Diona Browner, DDS;  Location: [facility];  Service: Oral Surgery;  Laterality: Bilateral;   MULTIPLE TOOTH EXTRACTIONS  01/24/2016   MULTIPLE EXTRACTION WITH ALVEOLOPLASTY BILATERAL (Bilateral)   POLYPECTOMY  02/12/2020   Procedure: POLYPECTOMY;  Surgeon: Ladene Artist, MD;  Location: [facility];  Service: Endoscopy;;   RIGHT/LEFT HEART CATH AND CORONARY ANGIOGRAPHY N/A 10/09/2020   Procedure: RIGHT/LEFT HEART CATH AND CORONARY ANGIOGRAPHY;  Surgeon: Burnell Blanks, MD;  Location: [facility] CV LAB;  Service: Cardiovascular;  Laterality: N/A;    Family History  Problem Relation Age of Onset   Diabetes Mother    Hypertension Mother    Cirrhosis Father    Alcohol abuse Father    Colon cancer Neg Hx    Rectal cancer Neg Hx    Stomach cancer Neg Hx    Esophageal cancer Neg Hx    Colon polyps Neg Hx     Social History   Socioeconomic History   Marital status: Married    Spouse name: [name]   Number of children: 1   Years of education: Not on file   Highest education level: 11th grade  Occupational History   Occupation: Retired  Tobacco Use   Smoking status: Some Days    Packs/day: 1.50    Years: 50.00    Pack years: 75.00    Types: Cigarettes   Smokeless tobacco: Never   Tobacco comments:    1-2 cigarettes  a day for a couple years now. encouraged cessation  Vaping Use   Vaping Use: Never used  Substance and Sexual Activity   Alcohol use: Yes    Alcohol/week: 2.0 standard drinks    Types: 2 Shots of liquor per week    Comment: Sometimes.   Drug use: Not Currently    Types: Heroin    Comment: stopped use in 2015. currently on suboxone   Sexual activity: Not Currently  Other Topics Concern   Not on file  Social History Narrative   Moved here from [place]. Lives with common law wife and grandchildren. 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."   Social Determinants of Radio broadcast assistant Strain: Low Risk    Difficulty  of Paying Living Expenses: Not hard at all  Food Insecurity: No Food Insecurity   Worried About Charity fundraiser in the Last Year: Never true   Arboriculturist in the Last Year: Never true  Transportation Needs: No Transportation Needs   Lack of Transportation (Medical): No   Lack of Transportation (Non-Medical): No  Physical Activity: Not on file  Stress: Not on file  Social Connections: Not on file  Intimate Partner Violence: Not on file     Prior to Admission medications   Medication Sig Start Date End Date Taking? Authorizing Provider  acetaminophen (TYLENOL) 500 MG tablet Take 1,000 mg by mouth every 6 (six) hours as needed for mild pain.    [provider]  albuterol (PROAIR HFA) 108 (90 Base) MCG/ACT inhaler Inhale 1-2 puffs into the lungs every 6 (six) hours as needed for wheezing or shortness of breath. 01/11/19   [name], MD  amiodarone (PACERONE) 400 MG tablet Take 1 tablet (400 mg total) by mouth 2 (two) times daily. 08/31/21   [name], MD  apixaban (ELIQUIS) 5 MG TABS tablet Take 1 tablet (5 mg total) by mouth 2 (two) times daily. 04/26/21   Jose Persia, MD  atorvastatin (LIPITOR) 80 MG tablet Take 1 tablet (80 mg total) by mouth daily. 04/26/21   Jose Persia, MD  camphor-menthol [facility]) lotion Apply topically 3 (three) times daily. 02/13/19   Kayleen Memos, DO  clopidogrel (PLAVIX) 75 MG tablet TAKE 1 TABLET(75 MG) BY MOUTH DAILY 09/03/21   Marianna Payment, MD  dapagliflozin propanediol (FARXIGA) 10 MG TABS tablet Take 1 tablet (10 mg total) by mouth daily before breakfast. 09/09/21   [name], PA-C  diclofenac Sodium (VOLTAREN) 1 % GEL Apply 2 g topically 4 (four) times daily as needed (as needed for pain). 08/31/21   [name], MD  hydrocortisone cream 1 % Apply 1 application topically 2 (two) times daily. 09/04/21   [name], MD  metoprolol succinate (TOPROL-XL) 25 MG 24 hr tablet Take 2 tablets (50 mg total) by mouth daily.  09/04/21   [name], MD  potassium chloride SA (KLOR-CON M) 20 MEQ tablet Take 1 tablet (20 mEq total) by mouth daily. 09/09/21   [name], PA-C  senna-docusate (SENOKOT-S) 8.6-50 MG tablet Take 1 tablet by mouth at bedtime as needed for mild constipation. 08/31/21   [name], MD  SUBOXONE 8-2 MG FILM Place 2 Film under the tongue daily. 08/21/15   [provider]  SYMBICORT 160-4.5 MCG/ACT inhaler Inhale 2 puffs into the lungs at bedtime. 07/02/21   Marianna Payment, MD  torsemide (DEMADEX) 20 MG tablet Take 2 tablets (40 mg total) by mouth 2 (two) times daily.  09/09/21   [name], PA-C    No Known Allergies  REVIEW OF SYSTEMS:  General: no fevers/chills/night sweats Eyes: no blurry vision, diplopia, or amaurosis ENT: no sore throat or hearing loss Resp: no cough, wheezing, or hemoptysis CV: no edema or palpitations GI: no abdominal pain, nausea, vomiting, diarrhea, or constipation GU: no dysuria, frequency, or hematuria Skin: no rash Neuro: no headache, numbness, tingling, or weakness of extremities Musculoskeletal: no joint pain or swelling Heme: no bleeding, DVT, or easy bruising Endo: no polydipsia or polyuria  There were no vitals taken for this visit.  PHYSICAL EXAM: GEN:  AO x 3 in no acute distress HEENT: normal Dentition: Normal*** Neck: JVP normal. +2***carotid upstrokes without bruits. No thyromegaly. Lungs: equal expansion, clear bilaterally CV: Apex is discrete and nondisplaced, RRR without murmur or gallop*** Abd: soft, non-tender, non-distended; no bruit; positive bowel sounds Ext: no edema, ecchymoses, or cyanosis Vascular: 2+ femoral pulses, 2+ radial pulses       Skin: warm and dry without rash Neuro: CN II-XII grossly intact; motor and sensory grossly intact    DATA AND STUDIES:  EKG:  ***  2D ECHO: 2/23 1. Left ventricular ejection fraction, by estimation, is 35 to 40%. The  left ventricle has moderately  decreased function. The left ventricle  demonstrates global hypokinesis. There is moderate left ventricular  hypertrophy. Left ventricular diastolic  parameters are indeterminate.   2. Right ventricular systolic function is normal. The right ventricular  size is normal. There is normal pulmonary artery systolic pressure.   3. Right atrial size was mildly dilated.   4. PISA 0.8 cm      ERO 0.34 cm2      MR Vol 50ml. The mitral valve is normal in structure. Severe mitral  valve regurgitation. No evidence of mitral stenosis.   5. Tricuspid valve regurgitation is severe.   6. The aortic valve is tricuspid. Aortic valve regurgitation is moderate.  No aortic stenosis is present.   7. Aortic dilatation noted. There is moderate dilatation of the ascending  aorta, measuring 49 mm.   8. The inferior vena cava is dilated in size with <50% respiratory  variability, suggesting right atrial pressure of 15 mmHg.   TEE:n/a  CARDIAC CATH: 3/22  RHC:  V waves to 10mmHg, RA 67mmHG, PA 38/12, CO 3.85L/min, CI 1.86L/min/m2  Prox LAD to Mid LAD lesion is 95% stenosed. A drug-eluting stent was successfully placed using a STENT RESOLUTE ONYX 3.5X22.  STS RISK CALCULATOR: Pending  NHYA CLASS: ***    ASSESSMENT AND PLAN:   No diagnosis found.   I have personally reviewed the patients imaging data as summarized above.  I have reviewed the natural history of aortic stenosis with the patient and family members who are present today. We have discussed the limitations of medical therapy and the poor prognosis associated with symptomatic mitral regurgitation. We have also reviewed potential treatment options, including palliative medical therapy, conventional mitral surgery, and transcatheter mitral edge-to-edge repair. We discussed treatment options in the context of this patient's specific comorbid medical conditions.   All of the patient's questions were answered today. Will make further  recommendations based on the results of studies outlined above.   Total time spent with patient today *** minutes. This includes reviewing records, evaluating the patient and coordinating care.   [name], MD  09/16/2021 10:10 AM    [facility] [address], [place], [place]  [address] Phone: [phone]; Fax: [phone]  °

## 2021-09-17 ENCOUNTER — Other Ambulatory Visit: Payer: Self-pay | Admitting: *Deleted

## 2021-09-17 DIAGNOSIS — F17210 Nicotine dependence, cigarettes, uncomplicated: Secondary | ICD-10-CM

## 2021-09-17 DIAGNOSIS — Z87891 Personal history of nicotine dependence: Secondary | ICD-10-CM

## 2021-09-19 ENCOUNTER — Institutional Professional Consult (permissible substitution): Payer: Medicare Other | Admitting: Internal Medicine

## 2021-09-22 ENCOUNTER — Encounter: Payer: Self-pay | Admitting: Internal Medicine

## 2021-09-22 ENCOUNTER — Telehealth: Payer: Self-pay | Admitting: Internal Medicine

## 2021-09-22 NOTE — Telephone Encounter (Signed)
[facility] RN Karene Fry [phone] requesting a call back for Verbal Orders and concerns about medications. ?

## 2021-09-22 NOTE — Telephone Encounter (Signed)
Pt has never seen Dr. Tamala Julian in clinic.  Did consult on pt in March 2022.   ? ?Spoke with Langley Gauss from [place] and she states pt has been non compliant with his medications.  She sat down with him today to do his pill box for the week and realized he did not take any PM doses of medications last week which includes his Eliquis, Amiodarone, and Torsemide.  States he is not weighing like he is suppose to.  Wife is no help.  Pt is alert and oriented.  Only allows nursing to come in one a week despite their request to come more to help with compliance.  She did have pt weigh today and weight was 197.2lbs which is down from 199lbs last week and 211lbs on 09/10/21.  Abd girth went from 131 to 130.  She is concerned about pt not taking his medications as prescribed and wanted to alert everyone.  Advised I will send message to Dr. Lovena Le as Amiodarone and Eliquis were previously prescribed by him.  Pt last seen EP in 2021 and was told to f/u in 6 months but pt cancelled that appt.  Will also route to HF team as pt is missing doses of Torsemide.   ? ?Pt currently scheduled to see Dr. Ali Lowe to discuss mitral clip on 3/10 and HF Impact team on 3/21. ?

## 2021-09-22 NOTE — Telephone Encounter (Signed)
Error

## 2021-09-22 NOTE — Telephone Encounter (Signed)
[name] from [place] is calling wanting to speak with someone in regards to this patient. She states she works with the patient and helps pre-pour his meds, but he will take his morning meds then not his night meds. She visits the home once a week because that is all the patient will allow and has requested two more visits with the patient. She is unsure if the patient just does not care or what due to him answering everything she asks clearly, but not following through. She is unsure what to do from here. She states the patient advised her he is scheduled to see Dr. Lovena Le on 03/09, but the patient only has 03/10 with Dr. Ali Lowe scheduled with our office. Please advise.  ?

## 2021-09-23 ENCOUNTER — Emergency Department (HOSPITAL_COMMUNITY): Payer: Medicare Other

## 2021-09-23 ENCOUNTER — Encounter (HOSPITAL_COMMUNITY): Payer: Self-pay | Admitting: Emergency Medicine

## 2021-09-23 ENCOUNTER — Other Ambulatory Visit: Payer: Self-pay

## 2021-09-23 ENCOUNTER — Emergency Department (HOSPITAL_COMMUNITY)
Admission: EM | Admit: 2021-09-23 | Discharge: 2021-09-23 | Disposition: A | Discharge status: Home / Self Care | Payer: Medicare Other | Attending: Emergency Medicine | Admitting: Emergency Medicine

## 2021-09-23 ENCOUNTER — Encounter: Payer: Self-pay | Admitting: Student

## 2021-09-23 DIAGNOSIS — Z7901 Long term (current) use of anticoagulants: Secondary | ICD-10-CM | POA: Diagnosis not present

## 2021-09-23 DIAGNOSIS — I5040 Unspecified combined systolic (congestive) and diastolic (congestive) heart failure: Secondary | ICD-10-CM | POA: Insufficient documentation

## 2021-09-23 DIAGNOSIS — Z79899 Other long term (current) drug therapy: Secondary | ICD-10-CM | POA: Diagnosis not present

## 2021-09-23 DIAGNOSIS — M25512 Pain in left shoulder: Secondary | ICD-10-CM

## 2021-09-23 DIAGNOSIS — I110 Hypertensive heart disease with heart failure: Secondary | ICD-10-CM | POA: Insufficient documentation

## 2021-09-23 DIAGNOSIS — Z7951 Long term (current) use of inhaled steroids: Secondary | ICD-10-CM | POA: Diagnosis not present

## 2021-09-23 DIAGNOSIS — J449 Chronic obstructive pulmonary disease, unspecified: Secondary | ICD-10-CM | POA: Diagnosis not present

## 2021-09-23 HISTORY — DX: Disorder of kidney and ureter, unspecified: N28.9

## 2021-09-23 LAB — BASIC METABOLIC PANEL
Anion gap: 13 (ref 5–15)
BUN: 47 mg/dL — ABNORMAL HIGH (ref 8–23)
CO2: 27 mmol/L (ref 22–32)
Calcium: 9.1 mg/dL (ref 8.9–10.3)
Chloride: 100 mmol/L (ref 98–111)
Creatinine, Ser: 2.5 mg/dL — ABNORMAL HIGH (ref 0.61–1.24)
GFR, Estimated: 26 mL/min — ABNORMAL LOW (ref >60)
Glucose, Bld: 125 mg/dL — ABNORMAL HIGH (ref 70–99)
Potassium: 4 mmol/L (ref 3.5–5.1)
Sodium: 140 mmol/L (ref 135–145)

## 2021-09-23 LAB — CBC WITH DIFFERENTIAL/PLATELET
Abs Immature Granulocytes: 0.05 10*3/uL (ref 0.00–0.07)
Basophils Absolute: 0.1 10*3/uL (ref 0.0–0.1)
Basophils Relative: 1 %
Eosinophils Absolute: 0.1 10*3/uL (ref 0.0–0.5)
Eosinophils Relative: 1 %
HCT: 53 % — ABNORMAL HIGH (ref 39.0–52.0)
Hemoglobin: 16.9 g/dL (ref 13.0–17.0)
Immature Granulocytes: 0 %
Lymphocytes Relative: 10 %
Lymphs Abs: 1.3 10*3/uL (ref 0.7–4.0)
MCH: 29.8 pg (ref 26.0–34.0)
MCHC: 31.9 g/dL (ref 30.0–36.0)
MCV: 93.5 fL (ref 80.0–100.0)
Monocytes Absolute: 1.1 10*3/uL — ABNORMAL HIGH (ref 0.1–1.0)
Monocytes Relative: 9 %
Neutro Abs: 9.7 10*3/uL — ABNORMAL HIGH (ref 1.7–7.7)
Neutrophils Relative %: 79 %
Platelets: 303 10*3/uL (ref 150–400)
RBC: 5.67 MIL/uL (ref 4.22–5.81)
RDW: 16.2 % — ABNORMAL HIGH (ref 11.5–15.5)
WBC: 12.3 10*3/uL — ABNORMAL HIGH (ref 4.0–10.5)
nRBC: 0 % (ref 0.0–0.2)

## 2021-09-23 LAB — TROPONIN I (HIGH SENSITIVITY)
Troponin I (High Sensitivity): 16 ng/L (ref <18)
Troponin I (High Sensitivity): 19 ng/L — ABNORMAL HIGH (ref <18)

## 2021-09-23 MED ORDER — KETOROLAC TROMETHAMINE 30 MG/ML IJ SOLN
30.0000 mg | Freq: Once | INTRAMUSCULAR | Status: AC
  Administered 2021-09-23: 30 mg via INTRAMUSCULAR
  Filled 2021-09-23: qty 1

## 2021-09-23 NOTE — ED Notes (Signed)
Pt provided water.  
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
## 2021-09-23 NOTE — ED Provider Triage Note (Signed)
Emergency Medicine Provider Triage Evaluation Note ? ?[name] , a 76 y.o. male  was evaluated in triage.  Pt complains of vaginal onset, constant, sharp, left chest/left shoulder pain that woke him up out of his sleep earlier today.  Patient denies any nausea, vomiting, diaphoresis, shortness of breath.  He states he tried taking Tylenol without relief.  He denies any issues with shoulder pain in the past.  Denies any trauma or fall. ? ?Review of Systems  ?Positive: + chest pain/shoulder pain ?Negative: - sob, nausea, vomiting ? ?Physical Exam  ?BP 106/77   Pulse (!) 116   Temp 98.1 ?F (36.7 ?C) (Oral)   Resp 18   SpO2 94%  ?Gen:   Awake, no distress   ?Resp:  Normal effort  ?MSK:   Moves extremities without difficulty  ?Other:  No chest wall TTP. + diffuse L shoulder TTP with limited ROM. 2+ radial pulse.  ? ?Medical Decision Making  ?Medically screening exam initiated at 2:30 AM.  Appropriate orders placed.  Manolo Bosket was informed that the remainder of the evaluation will be completed by another provider, this initial triage assessment does not replace that evaluation, and the importance of remaining in the ED until their evaluation is complete. ? ? ?  ?Eustaquio Maize, PA-C ?09/23/21 7425 ? ?

## 2021-09-23 NOTE — ED Triage Notes (Signed)
Patient arrived with EMS from home reports left shoulder pain this evening , denies injury , pain increases with movement/changing positions .  ?

## 2021-09-23 NOTE — ED Notes (Signed)
Pt provided warm blanket.

## 2021-09-23 NOTE — Telephone Encounter (Signed)
Spoke w/pt's wife, she states pt is taking his meds. Asked about Torsemide BID, she states she will make sure he is taking afternoon dose, she is very abrupt and not interested in further discussion, Reminded of upcoming appts, she states ok and ended call ?

## 2021-09-23 NOTE — Discharge Instructions (Signed)
The pain in your left shoulder and left upper arm, are likely muscular or from arthritis.  Try using heat on the sore area and take Tylenol for pain.  Follow-up with your heart doctor and primary care doctor as scheduled ?

## 2021-09-24 MED ORDER — AMIODARONE HCL 400 MG PO TABS: 400.0000 mg | ORAL_TABLET | Freq: Two times a day (BID) | ORAL | 0 refills | Status: DC

## 2021-09-24 NOTE — Telephone Encounter (Signed)
Returned call to Karene Fry, RN with [facility]. Requesting VO to continue [facility] skilled nursing for 2-3 more visits for  med management. Verbal auth given. Will route to Dr. Marianna Payment for agreement/denial. ? ?Langley Gauss has many concerns regarding patient not taking meds correctly. States she has filled pill boxes and he will usually take AM doses but not PM doses. So, he is missing second dose of torsemide, amiodarone, and eliquis daily.  ? ?States patient can repeat to her exactly what he should be doing but will not physically do it. He has a scale in home but won't weigh daily as he "forgets." There is evidence in home of cooking fresh foods but then also goes to [employer]. His usual routine is sitting on couch, watching TV, eating, drinking, and going to bathroom. No other activities. Wife is not involved in patient's care. She sits in a different room when Langley Gauss is there. ? ?Langley Gauss is concerned patient will not continue taking meds once her visits end. He has not p/u refills for amiodarone or potassium. She is wondering if EMT would be able to continue with patient as he is supposed to be part of [facility]. Will include Marylouise Stacks, EMT on this note.  ? ?Of note, patient did lose 3-4 lbs over last week. ? ? ? ? ? ? ? ?

## 2021-09-25 NOTE — H&P (View-Only) (Signed)
? ? ?Patient ID: ?[place] ?MRN: [id number] ?DOB/AGE: Sep 15, 1945 76 y.o. ? ?Primary Care Physician:Coe, [name], MD ?Primary Cardiologist: Cristopher Peru ?Referring Cardiologist:  HF section ? ? ?FOCUSED CARDIOVASCULAR PROBLEM LIST:   ?1.  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  Coronary artery disease status post PCI of the mid LAD ?3.  COPD ?4.  Atrial flutter status post ablation ?5.  Hypertension ?6.  Chronic kidney disease stage IIIa ?7.  Atrial fibrillation on apixaban ?8.  Severe mitral regurgitation (Carpentier type II) ?9.  Severe tricuspid regurgitation ?10.  Ambulates with cane ? ?HISTORY OF PRESENT ILLNESS: ? ?The patient is a 76 y.o. male with the indicated medical history here for recommendations regarding his severe mitral valvular disease.  He was recently discharged from the hospital about a month ago.  He had previously had a normal ejection fraction but he had this now developed severe cardiomyopathy with ejection fraction of 35%.  His echocardiogram that I reviewed demonstrated severe functional mitral regurgitation as well as severe tricuspid regurgitation.  He was optimized from a heart failure standpoint and discharged.  Since discharge he has been feeling fairly well.  He was seen by the heart failure division as an outpatient and his medications were optimized by being on a regimen including Toprol and [place].  Due to chronic kidney disease spironolactone and Entresto were deferred. ? ?He tells me that he is short of breath with exertion.  He is not short of breath at rest.  He endorses NYHA class II/III symptoms.  He is not short of breath at rest.  He tells me he can walk maybe 5 or 10 feet before getting short of breath.  He denies any peripheral edema, paroxysmal nocturnal dyspnea, orthopnea.  He has had no severe bleeding or bruising episodes while on anticoagulation.  He denies any significant palpitations.  He tells me that he  would like to feel better since he feels relatively poorly on most days than not.  He has been compliant with his medical therapy however on review of his medications there is a discrepancy between what he should be on per the last heart failure note what he is currently on. ? ?He tells me that he has upper and lower dentures. ? ? ?Past Medical History:  ?Diagnosis Date  ? Acute lower GI bleeding 02/15/2017  ? Atrial fibrillation ([place])   ? Cardiomyopathy   ? EF55% 11/14<<35%   ? CHF (congestive heart failure) ([place])   ? COPD (chronic obstructive pulmonary disease) ([place])   ? Difficult airway for intubation   ? per telephone encounter notation  ? Essential hypertension   ? GERD (gastroesophageal reflux disease)   ? Gout   ? Hepatitis   ? Possible history  ? History of atrial flutter   ? Ablation 2012  ? Hypertension   ? 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 Obesity   ? Pneumonia X 1  ? Primary polycythemia ([place]) 06/12/2013  ? Renal disorder   ? Renal insufficiency   ? Substance abuse ([place])   ? History of alcohol; hx cocaine, heroin, crack use  ? Syncope   ? Tubular adenoma of colon 08/2014  ?  ?Past Surgical History:  ?Procedure Laterality Date  ? CARDIAC ELECTROPHYSIOLOGY MAPPING AND ABLATION  08/2010  ? [name] 09/07/2010 (12/14/2012)  ? CARDIAC SURGERY    ? COLONOSCOPY    ? 15-20 years ago had colon in [place]  ? COLONOSCOPY WITH PROPOFOL N/A 02/12/2020  ?  Procedure: COLONOSCOPY WITH PROPOFOL;  Surgeon: Ladene Artist, MD;  Location: [facility];  Service: Endoscopy;  Laterality: N/A;  ? CORONARY STENT INTERVENTION N/A 10/10/2020  ? Procedure: CORONARY STENT INTERVENTION;  Surgeon: Lorretta Harp, MD;  Location: [place] [facility];  Service: Cardiovascular;  Laterality: N/A;  ? EXCISIONAL HEMORRHOIDECTOMY  1970's  ? LOOP RECORDER IMPLANT N/A 08/23/2013  ? Procedure: LOOP RECORDER IMPLANT;  Surgeon: Deboraha Sprang, MD;  Location: [facility] CATH LAB;  Service: Cardiovascular;  Laterality: N/A;  ? MULTIPLE EXTRACTIONS WITH  ALVEOLOPLASTY Bilateral 01/24/2016  ? Procedure: MULTIPLE EXTRACTION WITH ALVEOLOPLASTY BILATERAL;  Surgeon: Diona Browner, DDS;  Location: [place];  Service: Oral Surgery;  Laterality: Bilateral;  ? MULTIPLE TOOTH EXTRACTIONS  01/24/2016  ? MULTIPLE EXTRACTION WITH ALVEOLOPLASTY BILATERAL (Bilateral)  ? POLYPECTOMY  02/12/2020  ? Procedure: POLYPECTOMY;  Surgeon: Ladene Artist, MD;  Location: [facility];  Service: Endoscopy;;  ? RIGHT/LEFT HEART CATH AND CORONARY ANGIOGRAPHY N/A 10/09/2020  ? Procedure: RIGHT/LEFT HEART CATH AND CORONARY ANGIOGRAPHY;  Surgeon: Burnell Blanks, MD;  Location: [place] CV [facility];  Service: Cardiovascular;  Laterality: N/A;  ?  ?Family History  ?Problem Relation Age of Onset  ? Diabetes Mother   ? Hypertension Mother   ? Cirrhosis Father   ? Alcohol abuse Father   ? Colon cancer Neg Hx   ? Rectal cancer Neg Hx   ? Stomach cancer Neg Hx   ? Esophageal cancer Neg Hx   ? Colon polyps Neg Hx   ?  ?Social History  ? ?Socioeconomic History  ? Marital status: Unknown  ?  Spouse name: [name]  ? Number of children: 1  ? Years of education: Not on file  ? Highest education level: 11th grade  ?Occupational History  ? Occupation: Retired  ?Tobacco Use  ? Smoking status: Some Days  ?  Packs/day: 1.50  ?  Years: 50.00  ?  Pack years: 75.00  ?  Types: Cigarettes  ? Smokeless tobacco: Never  ? Tobacco comments:  ?  1-2 cigarettes a day for a couple years now. encouraged cessation  ?Vaping Use  ? Vaping Use: Never used  ?Substance and Sexual Activity  ? Alcohol use: Yes  ?  Comment: Sometimes.  ? Drug use: Not Currently  ?  Types: Heroin  ?  Comment: stopped use in 2015. currently on suboxone  ? Sexual activity: Not Currently  ?Other Topics Concern  ? Not on file  ?Social History Narrative  ? ** Merged History Encounter **  ?    ? Moved here from [place]. Lives with common law wife and grandchildren. He is retired from "general labor."  ? ?Social Determinants of Health  ? ?Financial Resource  Strain: Low Risk   ? Difficulty of Paying Living Expenses: Not hard at all  ?Food Insecurity: No Food Insecurity  ? Worried About Charity fundraiser in the Last Year: Never true  ? Ran Out of Food in the Last Year: Never true  ?Transportation Needs: No Transportation Needs  ? Lack of Transportation (Medical): No  ? Lack of Transportation (Non-Medical): No  ?Physical Activity: Not on file  ?Stress: Not on file  ?Social Connections: Not on file  ?Intimate Partner Violence: Not on file  ?  ? ?Prior to Admission medications   ?Medication Sig Start Date End Date Taking? Authorizing Provider  ?acetaminophen (TYLENOL) 500 MG tablet Take 1,000 mg by mouth every 6 (six) hours as needed for mild pain.  [provider]  ?albuterol (PROAIR HFA) 108 (90 Base) MCG/ACT inhaler Inhale 1-2 puffs into the lungs every 6 (six) hours as needed for wheezing or shortness of breath. 01/11/19   [name], MD  ?amiodarone (PACERONE) 400 MG tablet Take 1 tablet (400 mg total) by mouth 2 (two) times daily. 09/24/21   Marianna Payment, MD  ?amiodarone (PACERONE) 400 MG tablet Take 400 mg by mouth daily. 09/02/21   [provider]  ?apixaban (ELIQUIS) 5 MG TABS tablet Take 1 tablet (5 mg total) by mouth 2 (two) times daily. 04/26/21   Jose Persia, MD  ?atorvastatin (LIPITOR) 80 MG tablet Take 1 tablet (80 mg total) by mouth daily. 04/26/21   Jose Persia, MD  ?atorvastatin (LIPITOR) 80 MG tablet Take 80 mg by mouth daily. 07/31/21   [provider]  ?camphor-menthol Timoteo Ace) lotion Apply topically 3 (three) times daily. 02/13/19   Kayleen Memos, DO  ?clopidogrel (PLAVIX) 75 MG tablet TAKE 1 TABLET(75 MG) BY MOUTH DAILY 09/03/21   Marianna Payment, MD  ?dapagliflozin propanediol (FARXIGA) 10 MG TABS tablet Take 1 tablet (10 mg total) by mouth daily before breakfast. 09/09/21   [name], PA-C  ?diclofenac Sodium (VOLTAREN) 1 % GEL Apply 2 g topically 4 (four) times daily as needed (as needed for pain). 08/31/21    [name], MD  ?Arne [place] 5 MG TABS tablet Take 5 mg by mouth daily. 04/26/21   [provider]  ?furosemide (LASIX) 40 MG tablet Take 40 mg by mouth daily. 09/03/21   Provider, Historical,

## 2021-09-25 NOTE — Progress Notes (Signed)
Patient ID: [name] MRN: [id number] DOB/AGE: March 03, 1946 77 y.o.  Primary Care Physician:Coe, [name], MD Primary Cardiologist: Cristopher Peru Referring Cardiologist:  HF section   FOCUSED CARDIOVASCULAR PROBLEM LIST:   1.  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  Coronary artery disease status post PCI of the mid LAD 3.  COPD 4.  Atrial flutter status post ablation 5.  Hypertension 6.  Chronic kidney disease stage IIIa 7.  Atrial fibrillation on apixaban 8.  Severe mitral regurgitation (Carpentier type II) 9.  Severe tricuspid regurgitation 10.  Ambulates with cane  HISTORY OF PRESENT ILLNESS:  The patient is a 76 y.o. male with the indicated medical history here for recommendations regarding his severe mitral valvular disease.  He was recently discharged from the hospital about a month ago.  He had previously had a normal ejection fraction but he had this now developed severe cardiomyopathy with ejection fraction of 35%.  His echocardiogram that I reviewed demonstrated severe functional mitral regurgitation as well as severe tricuspid regurgitation.  He was optimized from a heart failure standpoint and discharged.  Since discharge he has been feeling fairly well.  He was seen by the heart failure division as an outpatient and his medications were optimized by being on a regimen including Toprol and [place].  Due to chronic kidney disease spironolactone and Entresto were deferred.  He tells me that he is short of breath with exertion.  He is not short of breath at rest.  He endorses NYHA class II/III symptoms.  He is not short of breath at rest.  He tells me he can walk maybe 5 or 10 feet before getting short of breath.  He denies any peripheral edema, paroxysmal nocturnal dyspnea, orthopnea.  He has had no severe bleeding or bruising episodes while on anticoagulation.  He denies any significant palpitations.  He tells me that he  would like to feel better since he feels relatively poorly on most days than not.  He has been compliant with his medical therapy however on review of his medications there is a discrepancy between what he should be on per the last heart failure note what he is currently on.  He tells me that he has upper and lower dentures.   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   Obesity    Pneumonia X 1   Primary polycythemia ([place]) 06/12/2013   Renal disorder    Renal insufficiency    Substance abuse ([place])    History of alcohol; hx cocaine, heroin, crack use   Syncope    Tubular adenoma of colon 08/2014    Past Surgical History:  Procedure Laterality Date   CARDIAC ELECTROPHYSIOLOGY MAPPING AND ABLATION  08/2010   [name] 09/07/2010 (12/14/2012)   CARDIAC SURGERY     COLONOSCOPY     15-20 years ago had colon in [place] PROPOFOL N/A 02/12/2020  Procedure: COLONOSCOPY WITH PROPOFOL;  Surgeon: Ladene Artist, MD;  [facility]: [facility];  Service: Endoscopy;  Laterality: N/A;   CORONARY STENT INTERVENTION N/A 10/10/2020   Procedure: CORONARY STENT INTERVENTION;  Surgeon: Lorretta Harp, MD;  Location: [place] [facility];  Service: Cardiovascular;  Laterality: N/A;   EXCISIONAL HEMORRHOIDECTOMY  1970's   LOOP RECORDER IMPLANT N/A 08/23/2013   Procedure: LOOP RECORDER IMPLANT;  Surgeon: Deboraha Sprang, MD;  Location: [facility] CATH LAB;  Service: Cardiovascular;  Laterality: N/A;   MULTIPLE EXTRACTIONS WITH  ALVEOLOPLASTY Bilateral 01/24/2016   Procedure: MULTIPLE EXTRACTION WITH ALVEOLOPLASTY BILATERAL;  Surgeon: Diona Browner, DDS;  Location: [place];  Service: Oral Surgery;  Laterality: Bilateral;   MULTIPLE TOOTH EXTRACTIONS  01/24/2016   MULTIPLE EXTRACTION WITH ALVEOLOPLASTY BILATERAL (Bilateral)   POLYPECTOMY  02/12/2020   Procedure: POLYPECTOMY;  Surgeon: Ladene Artist, MD;  [facility];  Service: Endoscopy;;   RIGHT/LEFT HEART CATH AND CORONARY ANGIOGRAPHY N/A 10/09/2020   Procedure: RIGHT/LEFT HEART CATH AND CORONARY ANGIOGRAPHY;  Surgeon: Burnell Blanks, MD;  Location: [place] [facility];  Service: Cardiovascular;  Laterality: N/A;    Family History  Problem Relation Age of Onset   Diabetes Mother    Hypertension Mother    Cirrhosis Father    Alcohol abuse Father    Colon cancer Neg Hx    Rectal cancer Neg Hx    Stomach cancer Neg Hx    Esophageal cancer Neg Hx    Colon polyps Neg Hx     Social History   Socioeconomic History   Marital status: Unknown    Spouse name: [name]   Number of children: 1   Years of education: Not on file   Highest education level: 11th grade  Occupational History   Occupation: Retired  Tobacco Use   Smoking status: Some Days    Packs/day: 1.50    Years: 50.00    Pack years: 75.00    Types: Cigarettes   Smokeless tobacco: Never   Tobacco comments:    1-2 cigarettes a day for a couple years now. encouraged cessation  Vaping Use   Vaping Use: Never used  Substance and Sexual Activity   Alcohol use: Yes    Comment: Sometimes.   Drug use: Not Currently    Types: Heroin    Comment: stopped use in 2015. currently on suboxone   Sexual activity: Not Currently  Other Topics Concern   Not on file  Social History Narrative   ** Merged History Encounter **       Moved here from [place]. Lives with common law wife and grandchildren. 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."   Social Determinants of Radio broadcast assistant  Strain: Low Risk    Difficulty of Paying Living Expenses: Not hard at all  Food Insecurity: No Food Insecurity   Worried About Charity fundraiser in the Last Year: Never true   Arboriculturist in the Last Year: Never true  Transportation Needs: No Transportation Needs   Lack of Transportation (Medical): No   Lack of Transportation (Non-Medical): No  Physical Activity: Not on file  Stress: Not on file  Social Connections: Not on file  Intimate Partner Violence: Not on file     Prior to Admission medications   Medication Sig Start Date End Date Taking? Authorizing Provider  acetaminophen (TYLENOL) 500 MG tablet Take 1,000 mg by mouth every 6 (six) hours as needed for mild pain.  [provider]  albuterol (PROAIR HFA) 108 (90 Base) MCG/ACT inhaler Inhale 1-2 puffs into the lungs every 6 (six) hours as needed for wheezing or shortness of breath. 01/11/19   [name], MD  amiodarone (PACERONE) 400 MG tablet Take 1 tablet (400 mg total) by mouth 2 (two) times daily. 09/24/21   Marianna Payment, MD  amiodarone (PACERONE) 400 MG tablet Take 400 mg by mouth daily. 09/02/21   [provider]  apixaban (ELIQUIS) 5 MG TABS tablet Take 1 tablet (5 mg total) by mouth 2 (two) times daily. 04/26/21   Jose Persia, MD  atorvastatin (LIPITOR) 80 MG tablet Take 1 tablet (80 mg total) by mouth daily. 04/26/21   Jose Persia, MD  atorvastatin (LIPITOR) 80 MG tablet Take 80 mg by mouth daily. 07/31/21   [provider]  camphor-menthol Timoteo Ace) lotion Apply topically 3 (three) times daily. 02/13/19   Kayleen Memos, DO  clopidogrel (PLAVIX) 75 MG tablet TAKE 1 TABLET(75 MG) BY MOUTH DAILY 09/03/21   Marianna Payment, MD  dapagliflozin propanediol (FARXIGA) 10 MG TABS tablet Take 1 tablet (10 mg total) by mouth daily before breakfast. 09/09/21   [name], PA-C  diclofenac Sodium (VOLTAREN) 1 % GEL Apply 2 g topically 4 (four) times daily as needed (as needed for pain). 08/31/21    [name], MD  ELIQUIS 5 MG TABS tablet Take 5 mg by mouth daily. 04/26/21   [provider]  furosemide (LASIX) 40 MG tablet Take 40 mg by mouth daily. 09/03/21   [provider]  hydrocortisone cream 1 % Apply 1 application topically 2 (two) times daily. 09/04/21   [name], MD  metoprolol succinate (TOPROL-XL) 25 MG 24 hr tablet Take 2 tablets (50 mg total) by mouth daily. 09/04/21   [name], MD  metoprolol succinate (TOPROL-XL) 25 MG 24 hr tablet Take 25 mg by mouth daily. 09/03/21   [provider]  potassium chloride SA (KLOR-CON M) 20 MEQ tablet Take 1 tablet (20 mEq total) by mouth daily. 09/09/21   [name], PA-C  senna-docusate (SENOKOT-S) 8.6-50 MG tablet Take 1 tablet by mouth at bedtime as needed for mild constipation. 08/31/21   [name], MD  SUBOXONE 8-2 MG FILM Place 2 Film under the tongue daily. 08/21/15   [provider]  SYMBICORT 160-4.5 MCG/ACT inhaler Inhale 2 puffs into the lungs at bedtime. 07/02/21   Marianna Payment, MD  SYMBICORT 160-4.5 MCG/ACT inhaler Inhale 2 puffs into the lungs daily. 07/02/21   [provider]  torsemide (DEMADEX) 20 MG tablet Take 2 tablets (40 mg total) by mouth 2 (two) times daily. 09/09/21   [name], PA-C  torsemide (DEMADEX) 20 MG tablet Take 40 mg by mouth 2 (two) times daily. 09/09/21   [provider]    No Known Allergies  REVIEW OF SYSTEMS:  General: no fevers/chills/night sweats Eyes: no blurry vision, diplopia, or amaurosis ENT: no sore throat or hearing loss Resp: no cough, wheezing, or hemoptysis CV: no edema or palpitations GI: no abdominal pain, nausea, vomiting, diarrhea, or constipation GU: no dysuria, frequency, or hematuria Skin: no rash Neuro: no headache, numbness, tingling, or weakness of extremities Musculoskeletal: no joint pain or swelling Heme: no bleeding, DVT, or easy bruising Endo: no polydipsia or polyuria  BP  (!) 122/46    Pulse 76    Ht 5' (1.524 m)    Wt 198 lb 6 oz (90 kg)  SpO2 92%    BMI 38.74 kg/m   PHYSICAL EXAM: GEN:  AO x 3 in no acute distress HEENT: Normal Dentition: Has dentures Neck: JVP ~8cm. +2 carotid upstrokes without bruits. No thyromegaly. Lungs: equal expansion, clear bilaterally CV: Apex is discrete and nondisplaced, RRR with soft systolic murmur at axilla which increases with expiration Abd: soft, non-tender, distended; no bruit; positive bowel sounds Ext: Trace edema, ecchymoses, or cyanosis Vascular: 2+ radial pulses       Skin: warm and dry without rash Neuro: CN II-XII grossly intact; motor and sensory grossly intact    DATA AND STUDIES:  EKG: Atrial fibrillation  2D ECHO: 2/23 1. Left ventricular ejection fraction, by estimation, is 35 to 40%. The  left ventricle has moderately decreased function. The left ventricle  demonstrates global hypokinesis. There is moderate left ventricular  hypertrophy. Left ventricular diastolic  parameters are indeterminate.   2. Right ventricular systolic function is normal. The right ventricular  size is normal. There is normal pulmonary artery systolic pressure.   3. Right atrial size was mildly dilated.   4. PISA 0.8 cm      ERO 0.34 cm2      MR Vol 58m. The mitral valve is normal in structure. Severe mitral  valve regurgitation. No evidence of mitral stenosis.   5. Tricuspid valve regurgitation is severe.   6. The aortic valve is tricuspid. Aortic valve regurgitation is moderate.  No aortic stenosis is present.   7. Aortic dilatation noted. There is moderate dilatation of the ascending  aorta, measuring 49 mm.   8. The inferior vena cava is dilated in size with <50% respiratory  variability, suggesting right atrial pressure of 15 mmHg.   TEE:n/a  CARDIAC CATH: 3/22  RHC:  V waves to 323mg, RA 1178m, PA 38/12, CO 3.85L/min, CI 1.86L/min/m2 PCI: Prox LAD to Mid LAD lesion is 95% stenosed. A drug-eluting  stent was successfully placed using a STENT RESOLUTE ONYX 3.5X22.  STS RISK CALCULATOR: N/A  NHYA CLASS: 2/3    ASSESSMENT AND PLAN:   Mitral valve insufficiency, unspecified etiology  NICM (nonischemic cardiomyopathy) (HCC)  Stage 3 chronic kidney disease, unspecified whether stage 3a or 3b CKD (HCC)  Persistent atrial fibrillation (HCC)  PAD (peripheral artery disease) ([place] patient has developed a severe nonischemic cardiomyopathy.  He is on appropriate medical therapy.  He is not a CRT candidate as he lacks left bundle branch block.  He is relatively euvolemic on exam today.  We will have him return to clinic to clarify his medical therapy with him and make sure he is on the right medications without duplicates.  However long-term I believe he is a candidate for mitral transcatheter edge-to-edge repair.  He is currently also has tricuspid regurgitation that is significant.  Right now tricuspid edge-to-edge repair is investigational.  I did talk to him about both of these issues.  He is interested in pursuing further therapy if this will make him feel better.  I will refer him for a TEE to evaluate his mitral and tricuspid valves.  If he looks to be a candidate we will think about a mitral edge-to-edge repair and then see how his tricuspid regurgitation looks.  I will refer him for right heart catheterization only as he had coronary angiography and PCI within the last year and around the time his ejection fraction was relatively low. I have personally reviewed the patients imaging data as summarized above.  I have reviewed the natural history  of aortic stenosis with the patient and family members who are present today. We have discussed the limitations of medical therapy and the poor prognosis associated with symptomatic mitral regurgitation. We have also reviewed potential treatment options, including palliative medical therapy, conventional mitral surgery, and transcatheter mitral  edge-to-edge repair. We discussed treatment options in the context of this patient's specific comorbid medical conditions.   All of the patient's questions were answered today. Will make further recommendations based on the results of studies outlined above.   Total time spent with patient today 60 minutes. This includes reviewing records, evaluating the patient and coordinating care.   [name], MD  09/26/2021 9:44 AM    [facility] [address], [address], [place]  [address] Phone: [phone]; Fax: [phone]

## 2021-09-26 ENCOUNTER — Encounter: Payer: Self-pay | Admitting: Internal Medicine

## 2021-09-26 ENCOUNTER — Other Ambulatory Visit: Payer: Self-pay

## 2021-09-26 ENCOUNTER — Encounter: Payer: Self-pay | Admitting: *Deleted

## 2021-09-26 ENCOUNTER — Ambulatory Visit: Payer: Medicare Other | Admitting: *Deleted

## 2021-09-26 ENCOUNTER — Ambulatory Visit (INDEPENDENT_AMBULATORY_CARE_PROVIDER_SITE_OTHER): Payer: Medicare Other | Admitting: Internal Medicine

## 2021-09-26 ENCOUNTER — Other Ambulatory Visit: Payer: Self-pay | Admitting: Internal Medicine

## 2021-09-26 VITALS — BP 122/46 | HR 76 | Ht 60.0 in | Wt 198.4 lb

## 2021-09-26 DIAGNOSIS — Z01812 Encounter for preprocedural laboratory examination: Secondary | ICD-10-CM

## 2021-09-26 DIAGNOSIS — N183 Chronic kidney disease, stage 3 unspecified: Secondary | ICD-10-CM | POA: Diagnosis not present

## 2021-09-26 DIAGNOSIS — I428 Other cardiomyopathies: Secondary | ICD-10-CM | POA: Diagnosis not present

## 2021-09-26 DIAGNOSIS — I340 Nonrheumatic mitral (valve) insufficiency: Secondary | ICD-10-CM | POA: Diagnosis not present

## 2021-09-26 DIAGNOSIS — Z79899 Other long term (current) drug therapy: Secondary | ICD-10-CM

## 2021-09-26 DIAGNOSIS — I4819 Other persistent atrial fibrillation: Secondary | ICD-10-CM | POA: Diagnosis not present

## 2021-09-26 DIAGNOSIS — I739 Peripheral vascular disease, unspecified: Secondary | ICD-10-CM

## 2021-09-26 LAB — BASIC METABOLIC PANEL
BUN/Creatinine Ratio: 28 — ABNORMAL HIGH (ref 10–24)
BUN: 67 mg/dL — ABNORMAL HIGH (ref 8–27)
CO2: 27 mmol/L (ref 20–29)
Calcium: 8.7 mg/dL (ref 8.6–10.2)
Chloride: 98 mmol/L (ref 96–106)
Creatinine, Ser: 2.43 mg/dL — ABNORMAL HIGH (ref 0.76–1.27)
Glucose: 98 mg/dL (ref 70–99)
Potassium: 3.7 mmol/L (ref 3.5–5.2)
Sodium: 142 mmol/L (ref 134–144)
eGFR: 27 mL/min/{1.73_m2} — ABNORMAL LOW (ref >59)

## 2021-09-26 LAB — CBC
Hematocrit: 48.9 % (ref 37.5–51.0)
Hemoglobin: 16.3 g/dL (ref 13.0–17.7)
MCH: 29.6 pg (ref 26.6–33.0)
MCHC: 33.3 g/dL (ref 31.5–35.7)
MCV: 89 fL (ref 79–97)
Platelets: 283 10*3/uL (ref 150–450)
RBC: 5.5 x10E6/uL (ref 4.14–5.80)
RDW: 14.5 % (ref 11.6–15.4)
WBC: 9.2 10*3/uL (ref 3.4–10.8)

## 2021-09-26 MED ORDER — AMIODARONE HCL 200 MG PO TABS: 200.0000 mg | ORAL_TABLET | Freq: Two times a day (BID) | ORAL | 1 refills | Status: DC

## 2021-09-26 MED ORDER — SODIUM CHLORIDE 0.9% FLUSH: 3.0000 mL | Freq: Two times a day (BID) | INTRAVENOUS | Status: DC

## 2021-09-26 NOTE — Patient Instructions (Addendum)
Nurse visit today for medication reconciliation. ?Pt brought pill box and all the medicines. ?His med list has been updated according to the last office visit with Lyda Jester PA-C on 09/09/21 ? ? ?We fixed his pill box for the week. ? ?A couple outstanding concerns:  ?The medication list indicates potassium chloride 20 meq which he did not bring,  and Toprol 25 mg -take 2 tabs daily.  The pt's bottle of Toprol 25 mg states 1 tab daily. ? ?I fixed the pill box with Toprol 25 mg one tab daily.  He will check with [facility] re: the potassium.  It will need added to the pill box. ? ?I will route the information to Dr. Ali Lowe today for review and if any changes are recommended we will contact the patient. ? ?I asked the patient to show this information to the [facility] Nurse when she returns to his home next week. ?

## 2021-09-26 NOTE — Progress Notes (Signed)
Nurse visit today for medication reconciliation. ?Pt brought pill box and all the medicines. ?His med list has been updated according to the last office visit with Lyda Jester PA-C on 09/09/21 ? ? ?We fixed his pill box for the week. ? ?A couple outstanding concerns:  ?The medication list indicates potassium chloride 20 meq which he did not bring,  and Toprol 25 mg -take 2 tabs daily.  The pt's bottle of Toprol 25 mg states 1 tab daily. ? ?I fixed the pill box with Toprol 25 mg one tab daily.  He will check with [facility] re: the potassium.  It will need added to the pill box. ? ?I will route the information to Dr. Ali Lowe today for review and if any changes are recommended we will contact the patient. ? ?I asked the patient to show this information to the [facility] Nurse when she returns to his home next week. ?

## 2021-09-26 NOTE — Patient Instructions (Addendum)
Medication Instructions:  ?No changes ?*If you need a refill on your cardiac medications before your next appointment, please call your pharmacy* ? ? ?Lab Work: ?Today: pre procedure labs ?If you have labs (blood work) drawn today and your tests are completely normal, you will receive your results only by: ?MyChart Message (if you have MyChart) OR ?A paper copy in the mail ?If you have any lab test that is abnormal or we need to change your treatment, we will call you to review the results. ? ? ?Testing/Procedures: ?Your physician has requested that you have a TEE. During a TEE, sound waves are used to create images of your heart. It provides your doctor with information about the size and shape of your heart and how well your heart?s chambers and valves are working. In this test, a transducer is attached to the end of a flexible tube that?s guided down your throat and into your esophagus (the tube leading from you mouth to your stomach) to get a more detailed image of your heart. You are not awake for the procedure. Please see the instruction sheet given to you today. For further information please visit [email or web address]. ? ?Your physician has requested that you have a cardiac catheterization. Cardiac catheterization is used to diagnose and/or treat various heart conditions. Doctors may recommend this procedure for a number of different reasons. The most common reason is to evaluate chest pain. Chest pain can be a symptom of coronary artery disease (CAD), and cardiac catheterization can show whether plaque is narrowing or blocking your heart?s arteries. This procedure is also used to evaluate the valves, as well as measure the blood flow and oxygen levels in different parts of your heart. For further information please visit [email or web address]. Please follow instruction sheet, as given. ? ? ?Follow-Up: ?Per Structural Heart Valve Team ? ? ?TEE INSTRUCTIONS: ?You are scheduled for a TEE on Tuesday October 21, 2021  with Dr. Johnsie Cancel.  Please arrive at the [facility] (Main Entrance A) at [facility]: [address] [place], [place] [address] at 8:00 am.   You are allowed ONE visitor in the waiting room during your procedure. Both you and your guest must wear masks. ? ?DIET: Nothing to eat or drink after midnight except a sip of water with medications. ? ?MEDICATION INSTRUCTIONS: ?1) You may take your other medications as directed with sips of water. ? ?LABS: Please return on Thursday 10/16/21 between 7:30 -4:30 pm for blood work only. ? ?Interior and spatial designer cards. ? ?5.  You must have a responsible person to drive you home and stay in the waiting area during your procedure. Failure to do so could result in cancellation. ? ?*Special Note: Every effort is made to have your procedure done on time. Occasionally there are emergencies that occur at the hospital that may cause delays. Please be patient if a delay does occur.  ? ? ?[place] [facility] [facility] ?[address], [address] ?[place] [address] ?Dept: [phone] ?Loc: [phone] ? ?[name]  09/26/2021 ? ?You are scheduled for a Cardiac Catheterization on Tuesday October 21, 2021 with Dr. Casandra Doffing. ? ?1. Please arrive at the Main Entrance A at [facility]: [address] [place], [place] [address] at 8:00 am  Free valet parking service is available.  ? ?Special note: Every effort is made to have your procedure done on time. Please understand that emergencies sometimes delay scheduled procedures. ? ?2.  Diet: Do not eat solid foods after midnight.  You may have clear liquids until 5 AM upon the day of the procedure. ? ?3. Labs: You will need to have blood drawn today. You do not need to be fasting. ? ?4. Medication instructions in preparation for your procedure: ? ? Contrast Allergy: No ? ?DO NOT TAKE ELIQUIS AFTER Saturday 10/18/21  You will be instructed on when to  restart. ? ?On the morning of your procedure, take Plavix/Clopidogrel and any morning medicines NOT listed above.  You may use sips of water. ? ?5. Plan to go home the same day, you will only stay overnight if medically necessary. ?6. You MUST have a responsible adult to drive you home. ?7. An adult MUST be with you the first 24 hours after you arrive home. ?8. Bring a current list of your medications, and the last time and date medication taken. ?9. Bring ID and current insurance cards. ?10.Please wear clothes that are easy to get on and off and wear slip-on shoes. ? ?Thank you for allowing [place] to care for you! ?  -- [place] [facility] ? ? ? ?

## 2021-09-29 ENCOUNTER — Ambulatory Visit
Admission: RE | Admit: 2021-09-29 | Discharge: 2021-09-29 | Disposition: A | Discharge status: Home / Self Care | Payer: Medicare Other | Source: Ambulatory Visit | Attending: Acute Care | Admitting: Acute Care

## 2021-09-29 DIAGNOSIS — F17210 Nicotine dependence, cigarettes, uncomplicated: Secondary | ICD-10-CM

## 2021-09-29 DIAGNOSIS — Z87891 Personal history of nicotine dependence: Secondary | ICD-10-CM

## 2021-09-30 ENCOUNTER — Other Ambulatory Visit: Payer: Self-pay

## 2021-09-30 DIAGNOSIS — Z87891 Personal history of nicotine dependence: Secondary | ICD-10-CM

## 2021-09-30 DIAGNOSIS — F17210 Nicotine dependence, cigarettes, uncomplicated: Secondary | ICD-10-CM

## 2021-10-03 ENCOUNTER — Ambulatory Visit (HOSPITAL_COMMUNITY)
Admission: RE | Admit: 2021-10-03 | Discharge: 2021-10-03 | Disposition: A | Discharge status: Home / Self Care | Payer: Medicare Other | Source: Ambulatory Visit | Attending: Internal Medicine | Admitting: Internal Medicine

## 2021-10-03 ENCOUNTER — Telehealth: Payer: Self-pay | Admitting: Internal Medicine

## 2021-10-03 ENCOUNTER — Other Ambulatory Visit: Payer: Self-pay

## 2021-10-03 ENCOUNTER — Encounter (HOSPITAL_COMMUNITY): Payer: Self-pay | Admitting: Internal Medicine

## 2021-10-03 ENCOUNTER — Ambulatory Visit (HOSPITAL_COMMUNITY)
Admission: RE | Disposition: A | Discharge status: Home / Self Care | Payer: Self-pay | Source: Ambulatory Visit | Attending: Internal Medicine

## 2021-10-03 DIAGNOSIS — I739 Peripheral vascular disease, unspecified: Secondary | ICD-10-CM | POA: Diagnosis not present

## 2021-10-03 DIAGNOSIS — I428 Other cardiomyopathies: Secondary | ICD-10-CM | POA: Diagnosis not present

## 2021-10-03 DIAGNOSIS — I4892 Unspecified atrial flutter: Secondary | ICD-10-CM | POA: Diagnosis not present

## 2021-10-03 DIAGNOSIS — N1831 Chronic kidney disease, stage 3a: Secondary | ICD-10-CM | POA: Insufficient documentation

## 2021-10-03 DIAGNOSIS — I130 Hypertensive heart and chronic kidney disease with heart failure and stage 1 through stage 4 chronic kidney disease, or unspecified chronic kidney disease: Secondary | ICD-10-CM | POA: Diagnosis not present

## 2021-10-03 DIAGNOSIS — Z7984 Long term (current) use of oral hypoglycemic drugs: Secondary | ICD-10-CM | POA: Insufficient documentation

## 2021-10-03 DIAGNOSIS — Z7901 Long term (current) use of anticoagulants: Secondary | ICD-10-CM | POA: Insufficient documentation

## 2021-10-03 DIAGNOSIS — I2510 Atherosclerotic heart disease of native coronary artery without angina pectoris: Secondary | ICD-10-CM | POA: Diagnosis not present

## 2021-10-03 DIAGNOSIS — Z7902 Long term (current) use of antithrombotics/antiplatelets: Secondary | ICD-10-CM | POA: Diagnosis not present

## 2021-10-03 DIAGNOSIS — I340 Nonrheumatic mitral (valve) insufficiency: Secondary | ICD-10-CM

## 2021-10-03 DIAGNOSIS — F17210 Nicotine dependence, cigarettes, uncomplicated: Secondary | ICD-10-CM | POA: Insufficient documentation

## 2021-10-03 DIAGNOSIS — J449 Chronic obstructive pulmonary disease, unspecified: Secondary | ICD-10-CM | POA: Insufficient documentation

## 2021-10-03 DIAGNOSIS — R0600 Dyspnea, unspecified: Secondary | ICD-10-CM | POA: Diagnosis not present

## 2021-10-03 DIAGNOSIS — I509 Heart failure, unspecified: Secondary | ICD-10-CM | POA: Diagnosis not present

## 2021-10-03 DIAGNOSIS — Z79899 Other long term (current) drug therapy: Secondary | ICD-10-CM | POA: Insufficient documentation

## 2021-10-03 DIAGNOSIS — Z955 Presence of coronary angioplasty implant and graft: Secondary | ICD-10-CM | POA: Diagnosis not present

## 2021-10-03 DIAGNOSIS — I4819 Other persistent atrial fibrillation: Secondary | ICD-10-CM | POA: Diagnosis not present

## 2021-10-03 DIAGNOSIS — Z7951 Long term (current) use of inhaled steroids: Secondary | ICD-10-CM | POA: Diagnosis not present

## 2021-10-03 DIAGNOSIS — I081 Rheumatic disorders of both mitral and tricuspid valves: Secondary | ICD-10-CM | POA: Insufficient documentation

## 2021-10-03 HISTORY — PX: RIGHT HEART CATH: CATH118263

## 2021-10-03 LAB — POCT I-STAT EG7
Acid-Base Excess: 6 mmol/L — ABNORMAL HIGH (ref 0.0–2.0)
Acid-Base Excess: 7 mmol/L — ABNORMAL HIGH (ref 0.0–2.0)
Bicarbonate: 32.8 mmol/L — ABNORMAL HIGH (ref 20.0–28.0)
Bicarbonate: 33.8 mmol/L — ABNORMAL HIGH (ref 20.0–28.0)
Calcium, Ion: 1.02 mmol/L — ABNORMAL LOW (ref 1.15–1.40)
Calcium, Ion: 1.1 mmol/L — ABNORMAL LOW (ref 1.15–1.40)
HCT: 47 % (ref 39.0–52.0)
HCT: 48 % (ref 39.0–52.0)
Hemoglobin: 16 g/dL (ref 13.0–17.0)
Hemoglobin: 16.3 g/dL (ref 13.0–17.0)
O2 Saturation: 60 %
O2 Saturation: 57 %
Potassium: 3.2 mmol/L — ABNORMAL LOW (ref 3.5–5.1)
Potassium: 3.3 mmol/L — ABNORMAL LOW (ref 3.5–5.1)
Sodium: 145 mmol/L (ref 135–145)
Sodium: 144 mmol/L (ref 135–145)
TCO2: 34 mmol/L — ABNORMAL HIGH (ref 22–32)
TCO2: 35 mmol/L — ABNORMAL HIGH (ref 22–32)
pCO2, Ven: 51.7 mmHg (ref 44–60)
pCO2, Ven: 52 mmHg (ref 44–60)
pH, Ven: 7.411 (ref 7.25–7.43)
pH, Ven: 7.421 (ref 7.25–7.43)
pO2, Ven: 31 mmHg — CL (ref 32–45)
pO2, Ven: 30 mmHg — CL (ref 32–45)

## 2021-10-03 SURGERY — RIGHT HEART CATH

## 2021-10-03 MED ORDER — SODIUM CHLORIDE 0.9% FLUSH: 3.0000 mL | INTRAVENOUS | Status: DC | PRN

## 2021-10-03 MED ORDER — HEPARIN (PORCINE) IN NACL 1000-0.9 UT/500ML-% IV SOLN
INTRAVENOUS | Status: AC
Start: 2021-10-03 — End: ?
  Filled 2021-10-03: qty 500

## 2021-10-03 MED ORDER — SODIUM CHLORIDE 0.9% FLUSH: 3.0000 mL | Freq: Two times a day (BID) | INTRAVENOUS | Status: DC

## 2021-10-03 MED ORDER — ACETAMINOPHEN 325 MG PO TABS: 650.0000 mg | ORAL_TABLET | ORAL | Status: DC | PRN

## 2021-10-03 MED ORDER — SODIUM CHLORIDE 0.9 % IV SOLN: 250.0000 mL | INTRAVENOUS | Status: DC | PRN

## 2021-10-03 MED ORDER — HEPARIN (PORCINE) IN NACL 1000-0.9 UT/500ML-% IV SOLN
INTRAVENOUS | Status: DC | PRN
  Administered 2021-10-03: 500 mL

## 2021-10-03 MED ORDER — SODIUM CHLORIDE 0.9 % IV SOLN: INTRAVENOUS | Status: DC

## 2021-10-03 MED ORDER — LIDOCAINE HCL (PF) 1 % IJ SOLN
INTRAMUSCULAR | Status: AC
  Filled 2021-10-03: qty 30

## 2021-10-03 MED ORDER — LABETALOL HCL 5 MG/ML IV SOLN: 10.0000 mg | INTRAVENOUS | Status: DC | PRN

## 2021-10-03 MED ORDER — LIDOCAINE HCL (PF) 1 % IJ SOLN
INTRAMUSCULAR | Status: DC | PRN
Start: 2021-10-03 — End: 2021-10-03
  Administered 2021-10-03 (×2): 2 mL

## 2021-10-03 MED ORDER — HEPARIN (PORCINE) IN NACL 1000-0.9 UT/500ML-% IV SOLN
INTRAVENOUS | Status: AC
  Filled 2021-10-03: qty 500

## 2021-10-03 MED ORDER — HYDRALAZINE HCL 20 MG/ML IJ SOLN: 10.0000 mg | INTRAMUSCULAR | Status: DC | PRN

## 2021-10-03 SURGICAL SUPPLY — 10 items
CATH BALLN WEDGE 5F 110CM (CATHETERS) ×1 IMPLANT
KIT HEART LEFT (KITS) ×1 IMPLANT
PACK CARDIAC CATHETERIZATION (CUSTOM PROCEDURE TRAY) ×1 IMPLANT
SHEATH GLIDE SLENDER 4/5FR (SHEATH) ×1 IMPLANT
SHEATH PROBE COVER 6X72 (BAG) ×1 IMPLANT
TUBING ART PRESS 72  MALE/FEM (TUBING) ×1
TUBING ART PRESS 72 MALE/FEM (TUBING) IMPLANT
TUBING CIL FLEX 10 FLL-RA (TUBING) ×1 IMPLANT
WIRE EMERALD 3MM-J .025X260CM (WIRE) ×1 IMPLANT
WIRE MICRO SET SILHO 5FR 7 (SHEATH) ×1 IMPLANT

## 2021-10-03 NOTE — Interval H&P Note (Signed)
History and Physical Interval Note: ? ?10/03/2021 ?7:01 AM ? ?[name]  has presented today for surgery, with the diagnosis of severe mr.  The various methods of treatment have been discussed with the patient and family. After consideration of risks, benefits and other options for treatment, the patient has consented to  Procedure(s): ?RIGHT HEART CATH (N/A) as a surgical intervention.  The patient's history has been reviewed, patient examined, no change in status, stable for surgery.  I have reviewed the patient's chart and labs.  Questions were answered to the patient's satisfaction.   ? ? ?[name] ? ? ?

## 2021-10-03 NOTE — Telephone Encounter (Signed)
I spoke w Langley Gauss from [place] [facility].  She said the patient is still very non compliant with medications.  His pill box was empty on Thursday when she went in.  Adv that he was seen as a new consult only at our office and that his primary care may be able to assist.  She meant to check with HF clinic today, not our office.  ? ?She is going to check back with his PCP and HF clinic re: the pre poured medication program she's trying to get him into.  She has been working with him for 6 weeks.   ?

## 2021-10-03 NOTE — Telephone Encounter (Signed)
[name] from [facility] was calling to report patient's lack of compliance on taking his medications.  They were supposed to be pre-poured into his weekly pill box and it was empty.  ?

## 2021-10-06 MED FILL — Heparin Sod (Porcine)-NaCl IV Soln 1000 Unit/500ML-0.9%: INTRAVENOUS | Qty: 500 | Status: AC

## 2021-10-07 ENCOUNTER — Other Ambulatory Visit (HOSPITAL_COMMUNITY): Payer: Self-pay | Admitting: *Deleted

## 2021-10-07 ENCOUNTER — Other Ambulatory Visit: Payer: Self-pay

## 2021-10-07 ENCOUNTER — Telehealth: Payer: Self-pay | Admitting: *Deleted

## 2021-10-07 ENCOUNTER — Ambulatory Visit (HOSPITAL_COMMUNITY)
Admission: RE | Admit: 2021-10-07 | Discharge: 2021-10-07 | Disposition: A | Discharge status: Home / Self Care | Payer: Medicare Other | Source: Ambulatory Visit | Attending: Adult Health | Admitting: Adult Health

## 2021-10-07 ENCOUNTER — Telehealth (HOSPITAL_COMMUNITY): Payer: Self-pay | Admitting: *Deleted

## 2021-10-07 VITALS — BP 110/70 | HR 100 | Wt 195.8 lb

## 2021-10-07 DIAGNOSIS — Z955 Presence of coronary angioplasty implant and graft: Secondary | ICD-10-CM | POA: Insufficient documentation

## 2021-10-07 DIAGNOSIS — I480 Paroxysmal atrial fibrillation: Secondary | ICD-10-CM | POA: Diagnosis not present

## 2021-10-07 DIAGNOSIS — Z8679 Personal history of other diseases of the circulatory system: Secondary | ICD-10-CM | POA: Diagnosis not present

## 2021-10-07 DIAGNOSIS — I4892 Unspecified atrial flutter: Secondary | ICD-10-CM | POA: Insufficient documentation

## 2021-10-07 DIAGNOSIS — G8929 Other chronic pain: Secondary | ICD-10-CM | POA: Insufficient documentation

## 2021-10-07 DIAGNOSIS — I130 Hypertensive heart and chronic kidney disease with heart failure and stage 1 through stage 4 chronic kidney disease, or unspecified chronic kidney disease: Secondary | ICD-10-CM | POA: Diagnosis not present

## 2021-10-07 DIAGNOSIS — I5042 Chronic combined systolic (congestive) and diastolic (congestive) heart failure: Secondary | ICD-10-CM

## 2021-10-07 DIAGNOSIS — I083 Combined rheumatic disorders of mitral, aortic and tricuspid valves: Secondary | ICD-10-CM | POA: Diagnosis not present

## 2021-10-07 DIAGNOSIS — I2510 Atherosclerotic heart disease of native coronary artery without angina pectoris: Secondary | ICD-10-CM | POA: Insufficient documentation

## 2021-10-07 DIAGNOSIS — I4819 Other persistent atrial fibrillation: Secondary | ICD-10-CM | POA: Diagnosis not present

## 2021-10-07 DIAGNOSIS — I719 Aortic aneurysm of unspecified site, without rupture: Secondary | ICD-10-CM | POA: Diagnosis not present

## 2021-10-07 DIAGNOSIS — N183 Chronic kidney disease, stage 3 unspecified: Secondary | ICD-10-CM

## 2021-10-07 DIAGNOSIS — Z79891 Long term (current) use of opiate analgesic: Secondary | ICD-10-CM | POA: Insufficient documentation

## 2021-10-07 DIAGNOSIS — N1832 Chronic kidney disease, stage 3b: Secondary | ICD-10-CM | POA: Insufficient documentation

## 2021-10-07 DIAGNOSIS — I483 Typical atrial flutter: Secondary | ICD-10-CM

## 2021-10-07 DIAGNOSIS — I428 Other cardiomyopathies: Secondary | ICD-10-CM

## 2021-10-07 DIAGNOSIS — F17210 Nicotine dependence, cigarettes, uncomplicated: Secondary | ICD-10-CM | POA: Diagnosis not present

## 2021-10-07 DIAGNOSIS — Z7901 Long term (current) use of anticoagulants: Secondary | ICD-10-CM | POA: Diagnosis not present

## 2021-10-07 DIAGNOSIS — Z79899 Other long term (current) drug therapy: Secondary | ICD-10-CM | POA: Diagnosis not present

## 2021-10-07 DIAGNOSIS — I340 Nonrheumatic mitral (valve) insufficiency: Secondary | ICD-10-CM | POA: Diagnosis not present

## 2021-10-07 DIAGNOSIS — D45 Polycythemia vera: Secondary | ICD-10-CM | POA: Insufficient documentation

## 2021-10-07 DIAGNOSIS — I222 Subsequent non-ST elevation (NSTEMI) myocardial infarction: Secondary | ICD-10-CM | POA: Diagnosis not present

## 2021-10-07 LAB — CBC
HCT: 53.5 % — ABNORMAL HIGH (ref 39.0–52.0)
Hemoglobin: 17.6 g/dL — ABNORMAL HIGH (ref 13.0–17.0)
MCH: 30.1 pg (ref 26.0–34.0)
MCHC: 32.9 g/dL (ref 30.0–36.0)
MCV: 91.5 fL (ref 80.0–100.0)
Platelets: 324 10*3/uL (ref 150–400)
RBC: 5.85 MIL/uL — ABNORMAL HIGH (ref 4.22–5.81)
RDW: 17.2 % — ABNORMAL HIGH (ref 11.5–15.5)
WBC: 11.5 10*3/uL — ABNORMAL HIGH (ref 4.0–10.5)
nRBC: 0 % (ref 0.0–0.2)

## 2021-10-07 LAB — BASIC METABOLIC PANEL
Anion gap: 13 (ref 5–15)
BUN: 44 mg/dL — ABNORMAL HIGH (ref 8–23)
CO2: 25 mmol/L (ref 22–32)
Calcium: 8.7 mg/dL — ABNORMAL LOW (ref 8.9–10.3)
Chloride: 101 mmol/L (ref 98–111)
Creatinine, Ser: 2.19 mg/dL — ABNORMAL HIGH (ref 0.61–1.24)
GFR, Estimated: 31 mL/min — ABNORMAL LOW (ref >60)
Glucose, Bld: 98 mg/dL (ref 70–99)
Potassium: 4 mmol/L (ref 3.5–5.1)
Sodium: 139 mmol/L (ref 135–145)

## 2021-10-07 MED ORDER — POTASSIUM CHLORIDE CRYS ER 20 MEQ PO TBCR: 20.0000 meq | EXTENDED_RELEASE_TABLET | Freq: Two times a day (BID) | ORAL | 2 refills | Status: DC

## 2021-10-07 MED ORDER — TORSEMIDE 20 MG PO TABS: ORAL_TABLET | ORAL | 3 refills | Status: DC

## 2021-10-07 NOTE — Patient Instructions (Signed)
Increase Torsemide to 60 mg (3 tabs) in AM and 40 mg (2 tabs) in PM ? ?Increase Potassium to 20 meq Twice daily  ? ?Continue Amiodarone 200 mg Twice daily  ? ?Labs done today, we will call you for abnormal results ? ?Your physician has requested that you have a TEE/Cardioversion. During a TEE, sound waves are used to create images of your heart. It provides your doctor with information about the size and shape of your heart and how well your heart?s chambers and valves are working. In this test, a transducer is attached to the end of a flexible tube that is guided down you throat and into your esophagus (the tube leading from your mouth to your stomach) to get a more detailed image of your heart. Once the TEE has determined that a blood clot is not present, the cardioversion begins. Electrical Cardioversion uses a jolt of electricity to your heart either through paddles or wired patches attached to your chest. This is a controlled, usually prescheduled, procedure. This procedure is done at the hospital and you are not awake during the procedure. You usually go home the day of the procedure. Please see the instruction sheet given to you today for more information. SEE INSTRUCTIONS BELOW ? ?Do the following things EVERYDAY: ?Weigh yourself in the morning before breakfast. Write it down and keep it in a log. ?Take your medicines as prescribed ?Eat low salt foods--Limit salt (sodium) to 2000 mg per day.  ?Stay as active as you can everyday ?Limit all fluids for the day to less than 2 liters ? ?Thank you for allowing [place] to provider your heart failure care after your recent hospitalization. Please follow-up with Dr Aundra Dubin in our [facility] on Thursday 10/23/21 at 2:20 PM ? ?If you have any questions, issues, or concerns before your next appointment please call our office at [phone], opt. 2 and leave a message for the triage nurse. ? ? ?CARDIOVERSION INSTRUCTIONS: ? ?You are scheduled for a TEE  Cardioversion on Thursday 10/16/21 with Dr. Aundra Dubin.   ? ?Please arrive at the [facility] (Main Entrance A) at [facility]: [address] [place], [place] [address] at 7:30 am ? ?DIET: Nothing to eat or drink after midnight except a sip of water with medications (see medication instructions below) ? ?FYI: For your safety, and to allow [place] to monitor your vital signs accurately during the surgery/procedure we request that   ?if you have artificial nails, gel coating, SNS etc. Please have those removed prior to your surgery/procedure. Not having the nail coverings /polish removed may result in cancellation or delay of your surgery/procedure. ? ? ?Medication Instructions: ?Hold TORSEMIDE WED 3/30 AM ? ?Continue your anticoagulant: ELIQUIS, DO NOT MISS ANY DOSES ? ? ?You must have a responsible person to drive you home and stay in the waiting area during your procedure. Failure to do so could result in cancellation. ? ?Interior and spatial designer cards. ? ?*Special Note: Every effort is made to have your procedure done on time. Occasionally there are emergencies that occur at the hospital that may cause delays. Please be patient if a delay does occur.  ? ?

## 2021-10-07 NOTE — H&P (View-Only) (Signed)
? ? ?HEART IMPACT TRANSITIONS OF CARE  ? ? ?PCP: Dr Marianna Payment ?Primary Cardiologist: Dr Tamala Julian III ?Structural Cardiolgist: Dr Ali Lowe ? ?HPI: ?[name] is a 76 y.o.  male  with a PMH significant for COPD, CAD s/p PCI to mid LAD 09/2020, atrial flutter s/p ablation 2012, MDS, HTN, CKD3a, Tobacco use disorder, polycythemia vera, and chronic pain on suboxone.  ?  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 He underwent an atrial flutter ablation in 2012. EF reportedly improved to 55% in 2014, then back down again in 2020 to 35-40%. Has struggled to maintain NSR on amiodarone with break through afib after flutter ablation. ?  ?Multiple hospitalizations for respiratory failure since 2014.  Has very bad COPD.  Required intubation in July 2020 and September 2020 underwent diuresis and was extubated and hospitalized again in November 2021 but did not require intubation. PFTs in 2020 c/w moderate COPD. Continues to smoke cigarrettes.  ?  ?He was admitted last year, 09/2020, with chest pain/NSTEMI and dyspnea found to have single vessel LAD disease and received PCI to mid LAD. Echo w/ moderately reduced LVEF, 40-45%, mod-severe MR and normal RV. He was diuresed and discharged from the hospital on metoprolol, lasix, farxiga, amiodarone, eliquis and dapt. He was referred to [facility] and saw Dr. Shan Levans but ultimately referred back to cardiology.  ?  ?Admitted  09/11/21 with A/C HFrEF. He was back in afib. Hs trop 19>>30>>31. UDS was negative. Echo w/ EF 35-40%, RV normal. Severe MR noted.  ERO 0.34 cm2. LA mildly dilated. Also w/ moderate AI and severe TR. He was diuresed w/ IV Lasix and noted some symptomatic improving, however cardiology noted the day he was discharged noted that he was still "quite volume overloaded". He was discharged home on Lasix 40 mg bid. Referred to TOC.  ? ?He was seen in  the HF [facility] 09/09/21. Volume overloaded. Switched to torsemide and started farxiga 10 mg daily. Amio was also cut back to 200 mg twice a day.  ? ?Saw Dr Ali Lowe on 09/26/21. Set up for cath. Had [place] 10/03/21 RA 5, PA 33/13 (19), PCWP 14, CO 3.3 CI 1.6. Plan for eventual mitral clip.  ? ?Today he returns for HF follow up. Overall feeling ok. Remains SOB with exertion and fatigue. No chest pain. Denies PND/Orthopnea. No bleeding issues. Appetite ok. No fever or chills. Weight at home 195 pounds. Taking all medications. ? ?Cardiac Studies  ?Echo 09/09/21 ?1. Left ventricular ejection fraction, by estimation, is 35 to 40%. The  ?left ventricle has moderately decreased function. The left ventricle  ?demonstrates global hypokinesis. There is moderate left ventricular  ?hypertrophy. Left ventricular diastolic  ?parameters are indeterminate.  ? 2. Right ventricular systolic function is normal. The right ventricular  ?size is normal. There is normal pulmonary artery systolic pressure.  ? 3. Right atrial size was mildly dilated.  ? 4. PISA 0.8 cm  ?    ERO 0.34 cm2  ?    MR Vol 16m. The mitral valve is normal in structure. Severe mitral  ?valve regurgitation. No evidence of mitral stenosis.  ? 5. Tricuspid valve regurgitation is severe.  ? 6. The aortic valve is tricuspid. Aortic valve regurgitation is moderate.  ?No aortic stenosis is present.  ? 7. Aortic dilatation noted. 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  ? 8.  The inferior vena cava is dilated in size with <50% respiratory  ?variability, suggesting right atrial pressure of 15 mmHg. ? ?[place] 10/03/21  ?RA 5  ?PA 33/13 ( 19)  ?PcWP 14 ?CO 3.3  ?CI 1.6  ? ?RHC/LHC 10/09/20 ?Prox LAD 95% ---> 10/10/21 DES LAD ?RA 11 ?PA 38/12 (26) ?PCWP 26  ?CO 3.8  ?CI 1.9  ? ?ROS: All systems negative except as listed in HPI, PMH and Problem List. ? ?SH:  ?Social History  ? ?Socioeconomic History  ? Marital status: Unknown  ?  Spouse name: [name]  ? Number of  children: 1  ? Years of education: Not on file  ? Highest education level: 11th grade  ?Occupational History  ? Occupation: Retired  ?Tobacco Use  ? Smoking status: Some Days  ?  Packs/day: 1.50  ?  Years: 50.00  ?  Pack years: 75.00  ?  Types: Cigarettes  ? Smokeless tobacco: Never  ? Tobacco comments:  ?  1-2 cigarettes a day for a couple years now. encouraged cessation  ?Vaping Use  ? Vaping Use: Never used  ?Substance and Sexual Activity  ? Alcohol use: Yes  ?  Comment: Sometimes.  ? Drug use: Not Currently  ?  Types: Heroin  ?  Comment: stopped use in 2015. currently on suboxone  ? Sexual activity: Not Currently  ?Other Topics Concern  ? Not on file  ?Social History Narrative  ? ** Merged History Encounter **  ?    ? Moved here from [place]. Lives with common law wife and grandchildren. He is retired from "general labor."  ? ?Social Determinants of Health  ? ?Financial Resource Strain: Low Risk   ? Difficulty of Paying Living Expenses: Not hard at all  ?Food Insecurity: No Food Insecurity  ? Worried About Charity fundraiser in the Last Year: Never true  ? Ran Out of Food in the Last Year: Never true  ?Transportation Needs: No Transportation Needs  ? Lack of Transportation (Medical): No  ? Lack of Transportation (Non-Medical): No  ?Physical Activity: Not on file  ?Stress: Not on file  ?Social Connections: Not on file  ?Intimate Partner Violence: Not on file  ? ? ?FH:  ?Family History  ?Problem Relation Age of Onset  ? Diabetes Mother   ? Hypertension Mother   ? Cirrhosis Father   ? Alcohol abuse Father   ? Colon cancer Neg Hx   ? Rectal cancer Neg Hx   ? Stomach cancer Neg Hx   ? Esophageal cancer Neg Hx   ? Colon polyps Neg Hx   ? ? ?Past Medical History:  ?Diagnosis Date  ? Acute lower GI bleeding 02/15/2017  ? Atrial fibrillation ([place])   ? Cardiomyopathy   ? EF55% 11/14<<35%   ? CHF (congestive heart failure) ([place])   ? COPD (chronic obstructive pulmonary disease) ([place])   ? Difficult airway for intubation    ? per telephone encounter notation  ? Essential hypertension   ? GERD (gastroesophageal reflux disease)   ? Gout   ? Hepatitis   ? Possible history  ? History of atrial flutter   ? Ablation 2012  ? Hypertension   ? 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 Obesity   ? Pneumonia X 1  ? Primary polycythemia ([place]) 06/12/2013  ? Renal disorder   ? Renal insufficiency   ? Substance abuse ([place])   ? History of alcohol; hx cocaine, heroin, crack use  ? Syncope   ? Tubular  adenoma of colon 08/2014  ? ? ?Current Outpatient Medications  ?Medication Sig Dispense Refill  ? acetaminophen (TYLENOL) 500 MG tablet Take 1,000 mg by mouth every 6 (six) hours as needed for mild pain.    ? albuterol (PROAIR HFA) 108 (90 Base) MCG/ACT inhaler Inhale 1-2 puffs into the lungs every 6 (six) hours as needed for wheezing or shortness of breath. 18 g 3  ? amiodarone (PACERONE) 200 MG tablet Take 1 tablet (200 mg total) by mouth 2 (two) times daily. 180 tablet 1  ? apixaban (ELIQUIS) 5 MG TABS tablet Take 1 tablet (5 mg total) by mouth 2 (two) times daily. 60 tablet 0  ? atorvastatin (LIPITOR) 80 MG tablet Take 1 tablet (80 mg total) by mouth daily. 30 tablet 5  ? camphor-menthol (SARNA) lotion Apply topically 3 (three) times daily. (Patient not taking: Reported on 09/26/2021) 222 mL 0  ? clopidogrel (PLAVIX) 75 MG tablet TAKE 1 TABLET(75 MG) BY MOUTH DAILY 90 tablet 3  ? dapagliflozin propanediol (FARXIGA) 10 MG TABS tablet Take 1 tablet (10 mg total) by mouth daily before breakfast. 90 tablet 3  ? diclofenac Sodium (VOLTAREN) 1 % GEL Apply 2 g topically 4 (four) times daily as needed (as needed for pain). 2 g 0  ? hydrocortisone cream 1 % Apply 1 application topically 2 (two) times daily. 30 g 2  ? metoprolol succinate (TOPROL-XL) 25 MG 24 hr tablet Take 2 tablets (50 mg total) by mouth daily. (Patient not taking: Reported on 09/26/2021) 90 tablet 3  ? metoprolol succinate (TOPROL-XL) 25 MG 24 hr tablet Take 25 mg by mouth daily.    ?  potassium chloride SA (KLOR-CON M) 20 MEQ tablet Take 1 tablet (20 mEq total) by mouth daily. (Patient not taking: Reported on 09/26/2021) 30 tablet 2  ? senna-docusate (SENOKOT-S) 8.6-50 MG tablet Take 1 ta

## 2021-10-07 NOTE — Progress Notes (Signed)
? ? ?HEART IMPACT TRANSITIONS OF CARE  ? ? ?PCP: Dr Marianna Payment ?Primary Cardiologist: Dr Tamala Julian III ?Structural Cardiolgist: Dr Ali Lowe ? ?HPI: ?[name] is a 76 y.o.  male  with a PMH significant for COPD, CAD s/p PCI to mid LAD 09/2020, atrial flutter s/p ablation 2012, MDS, HTN, CKD3a, Tobacco use disorder, polycythemia vera, and chronic pain on suboxone.  ?  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 He underwent an atrial flutter ablation in 2012. EF reportedly improved to 55% in 2014, then back down again in 2020 to 35-40%. Has struggled to maintain NSR on amiodarone with break through afib after flutter ablation. ?  ?Multiple hospitalizations for respiratory failure since 2014.  Has very bad COPD.  Required intubation in July 2020 and September 2020 underwent diuresis and was extubated and hospitalized again in November 2021 but did not require intubation. PFTs in 2020 c/w moderate COPD. Continues to smoke cigarrettes.  ?  ?He was admitted last year, 09/2020, with chest pain/NSTEMI and dyspnea found to have single vessel LAD disease and received PCI to mid LAD. Echo w/ moderately reduced LVEF, 40-45%, mod-severe MR and normal RV. He was diuresed and discharged from the hospital on metoprolol, lasix, farxiga, amiodarone, eliquis and dapt. He was referred to [facility] and saw Dr. Shan Levans but ultimately referred back to cardiology.  ?  ?Admitted  09/11/21 with A/C HFrEF. He was back in afib. Hs trop 19>>30>>31. UDS was negative. Echo w/ EF 35-40%, RV normal. Severe MR noted.  ERO 0.34 cm2. LA mildly dilated. Also w/ moderate AI and severe TR. He was diuresed w/ IV Lasix and noted some symptomatic improving, however cardiology noted the day he was discharged noted that he was still "quite volume overloaded". He was discharged home on Lasix 40 mg bid. Referred to TOC.  ? ?He was seen in  the HF [facility] 09/09/21. Volume overloaded. Switched to torsemide and started farxiga 10 mg daily. Amio was also cut back to 200 mg twice a day.  ? ?Saw Dr Ali Lowe on 09/26/21. Set up for cath. Had [place] 10/03/21 RA 5, PA 33/13 (19), PCWP 14, CO 3.3 CI 1.6. Plan for eventual mitral clip.  ? ?Today he returns for HF follow up. Overall feeling ok. Remains SOB with exertion and fatigue. No chest pain. Denies PND/Orthopnea. No bleeding issues. Appetite ok. No fever or chills. Weight at home 195 pounds. Taking all medications. ? ?Cardiac Studies  ?Echo 09/09/21 ?1. Left ventricular ejection fraction, by estimation, is 35 to 40%. The  ?left ventricle has moderately decreased function. The left ventricle  ?demonstrates global hypokinesis. There is moderate left ventricular  ?hypertrophy. Left ventricular diastolic  ?parameters are indeterminate.  ? 2. Right ventricular systolic function is normal. The right ventricular  ?size is normal. There is normal pulmonary artery systolic pressure.  ? 3. Right atrial size was mildly dilated.  ? 4. PISA 0.8 cm  ?    ERO 0.34 cm2  ?    MR Vol 28m. The mitral valve is normal in structure. Severe mitral  ?valve regurgitation. No evidence of mitral stenosis.  ? 5. Tricuspid valve regurgitation is severe.  ? 6. The aortic valve is tricuspid. Aortic valve regurgitation is moderate.  ?No aortic stenosis is present.  ? 7. Aortic dilatation noted. 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  ? 8.  The inferior vena cava is dilated in size with <50% respiratory  ?variability, suggesting right atrial pressure of 15 mmHg. ? ?[place] 10/03/21  ?RA 5  ?PA 33/13 ( 19)  ?PcWP 14 ?CO 3.3  ?CI 1.6  ? ?RHC/LHC 10/09/20 ?Prox LAD 95% ---> 10/10/21 DES LAD ?RA 11 ?PA 38/12 (26) ?PCWP 26  ?CO 3.8  ?CI 1.9  ? ?ROS: All systems negative except as listed in HPI, PMH and Problem List. ? ?SH:  ?Social History  ? ?Socioeconomic History  ? Marital status: Unknown  ?  Spouse name: [name]  ? Number of  children: 1  ? Years of education: Not on file  ? Highest education level: 11th grade  ?Occupational History  ? Occupation: Retired  ?Tobacco Use  ? Smoking status: Some Days  ?  Packs/day: 1.50  ?  Years: 50.00  ?  Pack years: 75.00  ?  Types: Cigarettes  ? Smokeless tobacco: Never  ? Tobacco comments:  ?  1-2 cigarettes a day for a couple years now. encouraged cessation  ?Vaping Use  ? Vaping Use: Never used  ?Substance and Sexual Activity  ? Alcohol use: Yes  ?  Comment: Sometimes.  ? Drug use: Not Currently  ?  Types: Heroin  ?  Comment: stopped use in 2015. currently on suboxone  ? Sexual activity: Not Currently  ?Other Topics Concern  ? Not on file  ?Social History Narrative  ? ** Merged History Encounter **  ?    ? Moved here from [place]. Lives with common law wife and grandchildren. He is retired from "general labor."  ? ?Social Determinants of Health  ? ?Financial Resource Strain: Low Risk   ? Difficulty of Paying Living Expenses: Not hard at all  ?Food Insecurity: No Food Insecurity  ? Worried About Charity fundraiser in the Last Year: Never true  ? Ran Out of Food in the Last Year: Never true  ?Transportation Needs: No Transportation Needs  ? Lack of Transportation (Medical): No  ? Lack of Transportation (Non-Medical): No  ?Physical Activity: Not on file  ?Stress: Not on file  ?Social Connections: Not on file  ?Intimate Partner Violence: Not on file  ? ? ?FH:  ?Family History  ?Problem Relation Age of Onset  ? Diabetes Mother   ? Hypertension Mother   ? Cirrhosis Father   ? Alcohol abuse Father   ? Colon cancer Neg Hx   ? Rectal cancer Neg Hx   ? Stomach cancer Neg Hx   ? Esophageal cancer Neg Hx   ? Colon polyps Neg Hx   ? ? ?Past Medical History:  ?Diagnosis Date  ? Acute lower GI bleeding 02/15/2017  ? Atrial fibrillation ([place])   ? Cardiomyopathy   ? EF55% 11/14<<35%   ? CHF (congestive heart failure) ([place])   ? COPD (chronic obstructive pulmonary disease) ([place])   ? Difficult airway for intubation    ? per telephone encounter notation  ? Essential hypertension   ? GERD (gastroesophageal reflux disease)   ? Gout   ? Hepatitis   ? Possible history  ? History of atrial flutter   ? Ablation 2012  ? Hypertension   ? 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 Obesity   ? Pneumonia X 1  ? Primary polycythemia ([place]) 06/12/2013  ? Renal disorder   ? Renal insufficiency   ? Substance abuse ([place])   ? History of alcohol; hx cocaine, heroin, crack use  ? Syncope   ? Tubular  adenoma of colon 08/2014  ? ? ?Current Outpatient Medications  ?Medication Sig Dispense Refill  ? acetaminophen (TYLENOL) 500 MG tablet Take 1,000 mg by mouth every 6 (six) hours as needed for mild pain.    ? albuterol (PROAIR HFA) 108 (90 Base) MCG/ACT inhaler Inhale 1-2 puffs into the lungs every 6 (six) hours as needed for wheezing or shortness of breath. 18 g 3  ? amiodarone (PACERONE) 200 MG tablet Take 1 tablet (200 mg total) by mouth 2 (two) times daily. 180 tablet 1  ? apixaban (ELIQUIS) 5 MG TABS tablet Take 1 tablet (5 mg total) by mouth 2 (two) times daily. 60 tablet 0  ? atorvastatin (LIPITOR) 80 MG tablet Take 1 tablet (80 mg total) by mouth daily. 30 tablet 5  ? camphor-menthol (SARNA) lotion Apply topically 3 (three) times daily. (Patient not taking: Reported on 09/26/2021) 222 mL 0  ? clopidogrel (PLAVIX) 75 MG tablet TAKE 1 TABLET(75 MG) BY MOUTH DAILY 90 tablet 3  ? dapagliflozin propanediol (FARXIGA) 10 MG TABS tablet Take 1 tablet (10 mg total) by mouth daily before breakfast. 90 tablet 3  ? diclofenac Sodium (VOLTAREN) 1 % GEL Apply 2 g topically 4 (four) times daily as needed (as needed for pain). 2 g 0  ? hydrocortisone cream 1 % Apply 1 application topically 2 (two) times daily. 30 g 2  ? metoprolol succinate (TOPROL-XL) 25 MG 24 hr tablet Take 2 tablets (50 mg total) by mouth daily. (Patient not taking: Reported on 09/26/2021) 90 tablet 3  ? metoprolol succinate (TOPROL-XL) 25 MG 24 hr tablet Take 25 mg by mouth daily.    ?  potassium chloride SA (KLOR-CON M) 20 MEQ tablet Take 1 tablet (20 mEq total) by mouth daily. (Patient not taking: Reported on 09/26/2021) 30 tablet 2  ? senna-docusate (SENOKOT-S) 8.6-50 MG tablet Take 1 ta

## 2021-10-07 NOTE — Telephone Encounter (Signed)
Call from [place] with [facility]. Stated she had talked to Lauren a few weeks ago about the difficulty she's having with this pt taking and understanding his medications. See lauren's telephone encounter 09/22/21. Stated Lauren planned to get in contact with Katie at the HF clinic. I read to [employer] response; ?"Unfortunately this patient will not be eligible to be enrolled in [employer] as he is not a Heart Failure Clinic patient.  He was seen in our transitions of care clinic but the notes state the plan is for him to return to his primary cardiologist, Dr. Tamala Julian, following his next appt in the [facility] clinic." ?Langley Gauss stated "good luck getting pt to that appt".  Langley Gauss stated she has been working with pt for quite a while and will be leaving in 2 weeks b/c she cannot see him just for medications. Stated she  doesn't know to do next. When she gets the home, pt's wife leaves. Stated she does not know what else to do at this point.Lauren is not here today; Langley Gauss asked if I will have Lauren to call her tomorrow @ [phone]. ?

## 2021-10-07 NOTE — Telephone Encounter (Signed)
Heart Failure Nurse Navigator Progress Note  ? ?Attempted to reach for appointment reminder. Patient did not have a voicemail set-up.  ? ?Earnestine Leys, BSN, RN ?Heart Failure Nurse Navigator ?[phone]  ?

## 2021-10-08 ENCOUNTER — Encounter (HOSPITAL_COMMUNITY): Payer: Self-pay | Admitting: Cardiology

## 2021-10-09 ENCOUNTER — Other Ambulatory Visit: Payer: Self-pay

## 2021-10-09 DIAGNOSIS — I340 Nonrheumatic mitral (valve) insufficiency: Secondary | ICD-10-CM

## 2021-10-09 NOTE — Telephone Encounter (Signed)
Lauren aware of requested return call by [facility]. ?

## 2021-10-16 ENCOUNTER — Ambulatory Visit (HOSPITAL_COMMUNITY)
Admission: RE | Admit: 2021-10-16 | Discharge: 2021-10-16 | Disposition: A | Discharge status: Home / Self Care | Payer: Medicare Other | Attending: Cardiology | Admitting: Cardiology

## 2021-10-16 ENCOUNTER — Other Ambulatory Visit: Payer: Self-pay

## 2021-10-16 ENCOUNTER — Other Ambulatory Visit: Payer: Medicare Other

## 2021-10-16 ENCOUNTER — Encounter (HOSPITAL_COMMUNITY)
Admission: RE | Disposition: A | Discharge status: Home / Self Care | Payer: Self-pay | Source: Home / Self Care | Attending: Cardiology

## 2021-10-16 ENCOUNTER — Encounter (HOSPITAL_COMMUNITY): Payer: Self-pay | Admitting: Cardiology

## 2021-10-16 ENCOUNTER — Ambulatory Visit (HOSPITAL_BASED_OUTPATIENT_CLINIC_OR_DEPARTMENT_OTHER)
Admission: RE | Admit: 2021-10-16 | Discharge: 2021-10-16 | Disposition: A | Discharge status: Home / Self Care | Payer: Medicare Other | Source: Home / Self Care | Attending: Cardiology | Admitting: Cardiology

## 2021-10-16 ENCOUNTER — Ambulatory Visit (HOSPITAL_BASED_OUTPATIENT_CLINIC_OR_DEPARTMENT_OTHER): Payer: Medicare Other | Admitting: Anesthesiology

## 2021-10-16 ENCOUNTER — Ambulatory Visit (HOSPITAL_COMMUNITY): Payer: Medicare Other | Admitting: Anesthesiology

## 2021-10-16 DIAGNOSIS — N1832 Chronic kidney disease, stage 3b: Secondary | ICD-10-CM | POA: Diagnosis not present

## 2021-10-16 DIAGNOSIS — I252 Old myocardial infarction: Secondary | ICD-10-CM | POA: Insufficient documentation

## 2021-10-16 DIAGNOSIS — I5042 Chronic combined systolic (congestive) and diastolic (congestive) heart failure: Secondary | ICD-10-CM | POA: Insufficient documentation

## 2021-10-16 DIAGNOSIS — I130 Hypertensive heart and chronic kidney disease with heart failure and stage 1 through stage 4 chronic kidney disease, or unspecified chronic kidney disease: Secondary | ICD-10-CM | POA: Insufficient documentation

## 2021-10-16 DIAGNOSIS — I4891 Unspecified atrial fibrillation: Secondary | ICD-10-CM | POA: Diagnosis not present

## 2021-10-16 DIAGNOSIS — Z7902 Long term (current) use of antithrombotics/antiplatelets: Secondary | ICD-10-CM | POA: Diagnosis not present

## 2021-10-16 DIAGNOSIS — J449 Chronic obstructive pulmonary disease, unspecified: Secondary | ICD-10-CM | POA: Diagnosis not present

## 2021-10-16 DIAGNOSIS — R400 Somnolence: Secondary | ICD-10-CM | POA: Insufficient documentation

## 2021-10-16 DIAGNOSIS — Z7951 Long term (current) use of inhaled steroids: Secondary | ICD-10-CM | POA: Insufficient documentation

## 2021-10-16 DIAGNOSIS — I4892 Unspecified atrial flutter: Secondary | ICD-10-CM | POA: Insufficient documentation

## 2021-10-16 DIAGNOSIS — I340 Nonrheumatic mitral (valve) insufficiency: Secondary | ICD-10-CM

## 2021-10-16 DIAGNOSIS — Z79899 Other long term (current) drug therapy: Secondary | ICD-10-CM | POA: Insufficient documentation

## 2021-10-16 DIAGNOSIS — I2510 Atherosclerotic heart disease of native coronary artery without angina pectoris: Secondary | ICD-10-CM | POA: Diagnosis not present

## 2021-10-16 DIAGNOSIS — I351 Nonrheumatic aortic (valve) insufficiency: Secondary | ICD-10-CM | POA: Diagnosis not present

## 2021-10-16 DIAGNOSIS — Z955 Presence of coronary angioplasty implant and graft: Secondary | ICD-10-CM | POA: Diagnosis not present

## 2021-10-16 DIAGNOSIS — Z7984 Long term (current) use of oral hypoglycemic drugs: Secondary | ICD-10-CM | POA: Insufficient documentation

## 2021-10-16 DIAGNOSIS — I4819 Other persistent atrial fibrillation: Secondary | ICD-10-CM | POA: Diagnosis present

## 2021-10-16 DIAGNOSIS — Z7901 Long term (current) use of anticoagulants: Secondary | ICD-10-CM | POA: Insufficient documentation

## 2021-10-16 DIAGNOSIS — I719 Aortic aneurysm of unspecified site, without rupture: Secondary | ICD-10-CM | POA: Insufficient documentation

## 2021-10-16 DIAGNOSIS — I083 Combined rheumatic disorders of mitral, aortic and tricuspid valves: Secondary | ICD-10-CM | POA: Insufficient documentation

## 2021-10-16 DIAGNOSIS — I480 Paroxysmal atrial fibrillation: Secondary | ICD-10-CM

## 2021-10-16 DIAGNOSIS — F17210 Nicotine dependence, cigarettes, uncomplicated: Secondary | ICD-10-CM | POA: Insufficient documentation

## 2021-10-16 HISTORY — PX: TEE WITHOUT CARDIOVERSION: SHX5443

## 2021-10-16 HISTORY — PX: CARDIOVERSION: SHX1299

## 2021-10-16 LAB — POCT I-STAT, CHEM 8
BUN: 48 mg/dL — ABNORMAL HIGH (ref 8–23)
Calcium, Ion: 1.17 mmol/L (ref 1.15–1.40)
Chloride: 101 mmol/L (ref 98–111)
Creatinine, Ser: 2.4 mg/dL — ABNORMAL HIGH (ref 0.61–1.24)
Glucose, Bld: 102 mg/dL — ABNORMAL HIGH (ref 70–99)
HCT: 53 % — ABNORMAL HIGH (ref 39.0–52.0)
Hemoglobin: 18 g/dL — ABNORMAL HIGH (ref 13.0–17.0)
Potassium: 4.2 mmol/L (ref 3.5–5.1)
Sodium: 143 mmol/L (ref 135–145)
TCO2: 29 mmol/L (ref 22–32)

## 2021-10-16 SURGERY — ECHOCARDIOGRAM, TRANSESOPHAGEAL
Anesthesia: Monitor Anesthesia Care

## 2021-10-16 MED ORDER — LACTATED RINGERS IV SOLN: INTRAVENOUS | Status: DC | PRN

## 2021-10-16 MED ORDER — PHENYLEPHRINE HCL-NACL 20-0.9 MG/250ML-% IV SOLN
INTRAVENOUS | Status: DC | PRN
  Administered 2021-10-16: 20 ug/min via INTRAVENOUS

## 2021-10-16 MED ORDER — SODIUM CHLORIDE 0.9 % IV SOLN: INTRAVENOUS | Status: DC

## 2021-10-16 MED ORDER — PROPOFOL 500 MG/50ML IV EMUL
INTRAVENOUS | Status: DC | PRN
  Administered 2021-10-16: 20 mg via INTRAVENOUS
  Administered 2021-10-16: 75 ug/kg/min via INTRAVENOUS

## 2021-10-16 MED ORDER — PHENYLEPHRINE 40 MCG/ML (10ML) SYRINGE FOR IV PUSH (FOR BLOOD PRESSURE SUPPORT)
PREFILLED_SYRINGE | INTRAVENOUS | Status: DC | PRN
Start: 2021-10-16 — End: 2021-10-16
  Administered 2021-10-16: 160 ug via INTRAVENOUS
  Administered 2021-10-16: 120 ug via INTRAVENOUS

## 2021-10-16 NOTE — Anesthesia Postprocedure Evaluation (Signed)
Anesthesia Post Note ? ?Patient: [name] ? ?Procedure(s) Performed: TRANSESOPHAGEAL ECHOCARDIOGRAM (TEE) ?CARDIOVERSION ? ?  ? ?Patient location during evaluation: Phase II ?Anesthesia Type: General ?Level of consciousness: awake ?Pain management: pain level controlled ?Vital Signs Assessment: post-procedure vital signs reviewed and stable ?Respiratory status: spontaneous breathing ?Cardiovascular status: stable ?Postop Assessment: no apparent nausea or vomiting ?Anesthetic complications: no ? ? ?No notable events documented. ? ?Last Vitals:  ?Vitals:  ? 10/16/21 1005 10/16/21 1010  ?BP: (!) 91/37   ?Pulse: (!) 53 (!) 55  ?Resp: 16 18  ?Temp:    ?SpO2: 100% 100%  ?  ?Last Pain:  ?Vitals:  ? 10/16/21 0957  ?TempSrc: Temporal  ?PainSc: 0-No pain  ? ? ?  ?  ?  ?  ?  ?  ? ?Huston Foley ? ? ? ? ?

## 2021-10-16 NOTE — Progress Notes (Signed)
CSW assisted in arranging transportation to get pt responsible party to hospital so they could ride home with patient.  Confirmed with patient and patient family no other form of transportation available at this time- able to arrange taxi voucher. ? ?Jorge Ny, LCSW ?Clinical Social Worker ?[facility] ?Desk#: [phone] ?Cell#: [phone] ? ?

## 2021-10-16 NOTE — Transfer of Care (Signed)
Immediate Anesthesia Transfer of Care Note ? ?Patient: [name] ? ?Procedure(s) Performed: TRANSESOPHAGEAL ECHOCARDIOGRAM (TEE) ?CARDIOVERSION ? ?Patient Location: PACU and Endoscopy Unit ? ?Anesthesia Type:MAC ? ?Level of Consciousness: drowsy ? ?Airway & Oxygen Therapy: Patient Spontanous Breathing ? ?Post-op Assessment: Report given to RN and Post -op Vital signs reviewed and stable ? ?Post vital signs: Reviewed and stable ? ?Last Vitals:  ?Vitals Value Taken Time  ?BP    ?Temp    ?Pulse    ?Resp    ?SpO2    ? ? ?Last Pain:  ?Vitals:  ? 10/16/21 0810  ?TempSrc: Temporal  ?PainSc: 0-No pain  ?   ? ?  ? ?Complications: No notable events documented. ?

## 2021-10-16 NOTE — Progress Notes (Signed)
?  Echocardiogram ?Echocardiogram Transesophageal has been performed. ? ?[name] ?10/16/2021, 10:48 AM ?

## 2021-10-16 NOTE — Interval H&P Note (Signed)
History and Physical Interval Note: ? ?10/16/2021 ?9:12 AM ? ?[name]  has presented today for surgery, with the diagnosis of atrial fib.  The various methods of treatment have been discussed with the patient and family. After consideration of risks, benefits and other options for treatment, the patient has consented to  Procedure(s): ?TRANSESOPHAGEAL ECHOCARDIOGRAM (TEE) (N/A) ?CARDIOVERSION (N/A) as a surgical intervention.  The patient's history has been reviewed, patient examined, no change in status, stable for surgery.  I have reviewed the patient's chart and labs.  Questions were answered to the patient's satisfaction.   ? ? ?Latrelle Fuston Aundra Dubin ? ? ?

## 2021-10-16 NOTE — CV Procedure (Addendum)
Procedure: TEE ? ?Sedation: Per anesthesiology ? ?Indication: Atrial fibrillation ? ?Findings: Please see echo section for full report.  Mildly dilated left ventricle with mild LV hypertrophy.  EF 45%, diffuse hypokinesis.  Normal RV size and systolic function.  Moderate left atrial appendage, no LA appendage thrombus.  Mild right atrial enlargement.  No PFO/ASD by color doppler.  Trileaflet aortic valve with moderate regurgitation and no stenosis, vena contracta 0.43 cm.  In the transgastric views while in AF, PHT was around 270 msec.  PHT longer by TTE when in NSR.  Moderate tricuspid regurgitation, peak RV-RA gradient 21 mmHg.  There was central mitral regurgitation, no stenosis (mean gradient 2 mmHg).  ERO 0.25 cm^2 by PISA (moderate MR) but vena contracta area 0.7 cm^2 (seere MR).  4 pulmonary veins assessed, no systolic flow reversal.  Mitral regurgitation appears moderate-severe, similar on TTE post-DCCV.  Dilated ascending aorta to 4.5 cm.  ? ?Impression:  ?1. May proceed to DCCV.  ?2. Moderate-severe MR ?3. Moderate AI ?4. Moderate TR ? ?Repeat TEE in future while in NSR to reassess mitral valve for ?Mitraclip.  ?

## 2021-10-16 NOTE — Procedures (Addendum)
Electrical Cardioversion Procedure Note ?[name] ?[id number] ?1945/08/17 ? ?Procedure: Electrical Cardioversion ?Indications:  Atrial Fibrillation ? ?Procedure Details ?Consent: Risks of procedure as well as the alternatives and risks of each were explained to the (patient/caregiver).  Consent for procedure obtained. ?Time Out: Verified patient identification, verified procedure, site/side was marked, verified correct patient position, special equipment/implants available, medications/allergies/relevent history reviewed, required imaging and test results available.  Performed ? ?Patient placed on cardiac monitor, pulse oximetry, supplemental oxygen as necessary.  ?Sedation given:  Per anesthesiology ?Pacer pads placed anterior and posterior chest. ? ?Cardioverted 1 time(s).  ?Cardioverted at 200J. ? ?Evaluation ?Findings: Post procedure EKG shows: NSR ?Complications: None ?Patient did tolerate procedure well. ? ? ?Loralie Champagne ?10/16/2021, 9:56 AM ? ? ? ?

## 2021-10-16 NOTE — Anesthesia Preprocedure Evaluation (Addendum)
Anesthesia Evaluation  ?Patient identified by MRN, date of birth, ID band ?Patient awake ? ? ? ?Reviewed: ?Allergy & Precautions, NPO status , Patient's Chart, lab work & pertinent test results, reviewed documented beta blocker date and time  ? ?History of Anesthesia Complications ?Negative for: history of anesthetic complications ? ?Airway ?Mallampati: I ? ?TM Distance: >3 FB ?Neck ROM: Full ? ? ? Dental ? ?(+) Edentulous Upper, Upper Dentures, Edentulous Lower ?  ?Pulmonary ?COPD,  COPD inhaler, Current SmokerPatient did not abstain from smoking.,  ?02/08/2020 SARS coronavirus NEG ?  ?breath sounds clear to auscultation ? ? ? ? ? ? Cardiovascular ?hypertension, Pt. on medications and Pt. on home beta blockers ?(-) angina+ Cardiac Stents and +CHF  ?Normal cardiovascular exam+ dysrhythmias Atrial Fibrillation  ?Rhythm:Regular Rate:Normal ? ?'20 ECHO: EF 35-40%. The cavity size was moderately dilated, mild concentric left ventricular hypertrophy, mild MR, mod AI, mild TR ?  ?Neuro/Psych ?PSYCHIATRIC DISORDERS (substance abuse) negative neurological ROS ?   ? GI/Hepatic ?(+)  ?  ? substance abuse (on suboxone) ? alcohol use and cocaine use, Adenoma colon ?GI bleed ?  ?Endo/Other  ?obese ? Renal/GU ?Renal InsufficiencyRenal disease  ? ?  ?Musculoskeletal ? ?(+) narcotic dependent ? Abdominal ?(+) + obese,   ?Peds ? Hematology ?eliquis ?polycythemia with Jak 2 mutation: hydroxyurea    ?Anesthesia Other Findings ?The patient is a 76 year old male with a nonischemic cardiomyopathy, COPD, coronary artery disease status post PCI of the mid LAD in 2022, atrial fibrillation status post ablation, atrial flutter on apixaban, stage IIIa chronic kidney disease, and probably valvular disease consisting of severe mitral and tricuspid regurgitation who was evaluated in the outpatient setting due to ongoing dyspnea.  He is optimized from a heart failure standpoint.  He is referred for right heart  catheterization as he is being considered for mitral transcatheter edge-to-edge repair procedure. ? ? Reproductive/Obstetrics ? ?  ? ? ? ? ? ? ? ? ? ? ? ? ? ?  ?  ? ? ? ? ? ? ? ?Anesthesia Physical ? ?Anesthesia Plan ? ?ASA: III ? ?Anesthesia Plan: General  ? ?Post-op Pain Management: Minimal or no pain anticipated  ? ?Induction:  ? ?PONV Risk Score and Plan: 0 and Propofol infusion and TIVA ? ?Airway Management Planned: Natural Airway and Mask ? ?Additional Equipment: None ? ?Intra-op Plan:  ? ?Post-operative Plan:  ? ?Informed Consent: I have reviewed the patients History and Physical, chart, labs and discussed the procedure including the risks, benefits and alternatives for the proposed anesthesia with the patient or authorized representative who has indicated his/her understanding and acceptance.  ? ? ? ?Dental advisory given ? ?Plan Discussed with: CRNA ? ?Anesthesia Plan Comments:   ? ? ? ? ?Anesthesia Quick Evaluation ? ?

## 2021-10-17 ENCOUNTER — Encounter (HOSPITAL_COMMUNITY): Payer: Self-pay | Admitting: Cardiology

## 2021-10-20 ENCOUNTER — Telehealth: Payer: Self-pay

## 2021-10-20 NOTE — Telephone Encounter (Signed)
Called to check on patient. He feels fine after TEE/DCCV last week, his throat is just a little sore. ? ?Confirmed original TEE for tomorrow is cancelled.  ?Confirmed rescheduled TEE 4/21. ?He understands he is not allowed to drive to this appointment.  ?He stated he has no one to drive him. He understands a plan will be made and he will be called back to confirm ride. ?He was grateful for call and agrees with plan.  ?

## 2021-10-21 ENCOUNTER — Encounter (HOSPITAL_COMMUNITY): Payer: Self-pay

## 2021-10-21 ENCOUNTER — Telehealth: Payer: Self-pay | Admitting: Licensed Clinical Social Worker

## 2021-10-21 ENCOUNTER — Ambulatory Visit (HOSPITAL_COMMUNITY): Admit: 2021-10-21 | Payer: Medicare Other | Admitting: Cardiovascular Disease

## 2021-10-21 SURGERY — ECHOCARDIOGRAM, TRANSESOPHAGEAL
Anesthesia: Monitor Anesthesia Care

## 2021-10-21 NOTE — Telephone Encounter (Signed)
CSW received request to assist with transportation for procedure scheduled for 11-07-21 at 10 am at [place] contacted [employer] [phone] to make arrangements. [facility] [employer] will schedule, contact patient with pick up time and provide roundtrip transport. CSW confirmed with patient's family member with transport arrangements. CSW available as needed. Raquel Sarna, [place], [place] ? ?

## 2021-10-23 ENCOUNTER — Ambulatory Visit (HOSPITAL_COMMUNITY)
Admission: RE | Admit: 2021-10-23 | Discharge: 2021-10-23 | Disposition: A | Discharge status: Home / Self Care | Payer: Medicare Other | Source: Ambulatory Visit | Attending: Cardiology | Admitting: Cardiology

## 2021-10-23 ENCOUNTER — Encounter (HOSPITAL_COMMUNITY): Payer: Self-pay | Admitting: Cardiology

## 2021-10-23 VITALS — BP 90/50 | HR 44 | Wt 201.0 lb

## 2021-10-23 DIAGNOSIS — I480 Paroxysmal atrial fibrillation: Secondary | ICD-10-CM | POA: Diagnosis not present

## 2021-10-23 DIAGNOSIS — I130 Hypertensive heart and chronic kidney disease with heart failure and stage 1 through stage 4 chronic kidney disease, or unspecified chronic kidney disease: Secondary | ICD-10-CM | POA: Insufficient documentation

## 2021-10-23 DIAGNOSIS — F17210 Nicotine dependence, cigarettes, uncomplicated: Secondary | ICD-10-CM | POA: Diagnosis not present

## 2021-10-23 DIAGNOSIS — R001 Bradycardia, unspecified: Secondary | ICD-10-CM | POA: Insufficient documentation

## 2021-10-23 DIAGNOSIS — I252 Old myocardial infarction: Secondary | ICD-10-CM | POA: Diagnosis not present

## 2021-10-23 DIAGNOSIS — N183 Chronic kidney disease, stage 3 unspecified: Secondary | ICD-10-CM | POA: Insufficient documentation

## 2021-10-23 DIAGNOSIS — J449 Chronic obstructive pulmonary disease, unspecified: Secondary | ICD-10-CM | POA: Insufficient documentation

## 2021-10-23 DIAGNOSIS — D45 Polycythemia vera: Secondary | ICD-10-CM | POA: Insufficient documentation

## 2021-10-23 DIAGNOSIS — I5042 Chronic combined systolic (congestive) and diastolic (congestive) heart failure: Secondary | ICD-10-CM

## 2021-10-23 DIAGNOSIS — I2510 Atherosclerotic heart disease of native coronary artery without angina pectoris: Secondary | ICD-10-CM | POA: Diagnosis not present

## 2021-10-23 DIAGNOSIS — I082 Rheumatic disorders of both aortic and tricuspid valves: Secondary | ICD-10-CM | POA: Insufficient documentation

## 2021-10-23 DIAGNOSIS — Z79899 Other long term (current) drug therapy: Secondary | ICD-10-CM | POA: Insufficient documentation

## 2021-10-23 DIAGNOSIS — I4892 Unspecified atrial flutter: Secondary | ICD-10-CM | POA: Insufficient documentation

## 2021-10-23 DIAGNOSIS — Z955 Presence of coronary angioplasty implant and graft: Secondary | ICD-10-CM | POA: Diagnosis not present

## 2021-10-23 LAB — COMPREHENSIVE METABOLIC PANEL WITH GFR
ALT: 22 U/L (ref 0–44)
AST: 17 U/L (ref 15–41)
Albumin: 3.1 g/dL — ABNORMAL LOW (ref 3.5–5.0)
Alkaline Phosphatase: 130 U/L — ABNORMAL HIGH (ref 38–126)
Anion gap: 10 (ref 5–15)
BUN: 75 mg/dL — ABNORMAL HIGH (ref 8–23)
CO2: 22 mmol/L (ref 22–32)
Calcium: 8.2 mg/dL — ABNORMAL LOW (ref 8.9–10.3)
Chloride: 109 mmol/L (ref 98–111)
Creatinine, Ser: 3.75 mg/dL — ABNORMAL HIGH (ref 0.61–1.24)
GFR, Estimated: 16 mL/min — ABNORMAL LOW
Glucose, Bld: 121 mg/dL — ABNORMAL HIGH (ref 70–99)
Potassium: 4.7 mmol/L (ref 3.5–5.1)
Sodium: 141 mmol/L (ref 135–145)
Total Bilirubin: 0.6 mg/dL (ref 0.3–1.2)
Total Protein: 6.9 g/dL (ref 6.5–8.1)

## 2021-10-23 LAB — CBC
HCT: 48 % (ref 39.0–52.0)
Hemoglobin: 15.2 g/dL (ref 13.0–17.0)
MCH: 29.7 pg (ref 26.0–34.0)
MCHC: 31.7 g/dL (ref 30.0–36.0)
MCV: 93.9 fL (ref 80.0–100.0)
Platelets: 266 K/uL (ref 150–400)
RBC: 5.11 MIL/uL (ref 4.22–5.81)
RDW: 18.2 % — ABNORMAL HIGH (ref 11.5–15.5)
WBC: 8.8 K/uL (ref 4.0–10.5)
nRBC: 0 % (ref 0.0–0.2)

## 2021-10-23 LAB — TSH: TSH: 2.243 u[IU]/mL (ref 0.350–4.500)

## 2021-10-23 LAB — BRAIN NATRIURETIC PEPTIDE: B Natriuretic Peptide: 697.3 pg/mL — ABNORMAL HIGH (ref 0.0–100.0)

## 2021-10-23 LAB — ECHO TEE
MV M vel: 5.01 m/s
MV Peak grad: 100.2 mmHg
P 1/2 time: 402 msec
Radius: 0.68 cm

## 2021-10-23 MED ORDER — METOPROLOL SUCCINATE ER 25 MG PO TB24: 25.0000 mg | ORAL_TABLET | Freq: Every day | ORAL | 3 refills | Status: DC

## 2021-10-23 MED ORDER — TORSEMIDE 20 MG PO TABS: 60.0000 mg | ORAL_TABLET | Freq: Two times a day (BID) | ORAL | 6 refills | Status: DC

## 2021-10-23 MED ORDER — SPIRONOLACTONE 25 MG PO TABS: 12.5000 mg | ORAL_TABLET | Freq: Every day | ORAL | 3 refills | Status: DC

## 2021-10-23 MED ORDER — AMIODARONE HCL 200 MG PO TABS: 200.0000 mg | ORAL_TABLET | Freq: Every day | ORAL | 1 refills | Status: AC

## 2021-10-23 NOTE — Patient Instructions (Addendum)
Medication Changes: ? ?Stop Plavix ? ?Decrease Amiodarone to 200 mg daily ? ?Decrease Toprol XL to 25 mg daily ? ?Start Spironolactone 12.5 mg (1/2 Tab) daily ? ?Increase Torsemide to 80 mg in the morning (4 Tabs) and 60 mg in the evening (3 Tabs) for 3 days only. Then take 60 mg (3 Tab) Twice daily ? ? ?Lab Work: ? ?Labs done today, your results will be available in MyChart, we will contact you for abnormal readings. ? ? ?Testing/Procedures: ? ?Repeat blood work in 7-10 days ? ?Referrals: ? ?none ? ?Special Instructions // Education: ? ?none ? ?Follow-Up in: 2 weeks  ? ?At the [facility], you and your health needs are our priority. We have a designated team specialized in the treatment of Heart Failure. This Care Team includes your primary Heart Failure Specialized Cardiologist (physician), Advanced Practice Providers (APPs- Physician Assistants and Nurse Practitioners), and Pharmacist who all work together to provide you with the care you need, when you need it.  ? ?You may see any of the following providers on your designated Care Team at your next follow up: ? ?Dr Glori Bickers ?Dr Loralie Champagne ?Darrick Grinder, NP ?Lyda Jester, PA ?Jessica Milford,NP ?Marlyce Huge, PA ?Audry Riles, PharmD ? ? ?Please be sure to bring in all your medications bottles to every appointment.  ? ?Need to Contact us: ? ?If you have any questions or concerns before your next appointment please send [place] a message through [place] or call our office at [phone].   ? ?TO LEAVE A MESSAGE FOR THE NURSE SELECT OPTION 2, PLEASE LEAVE A MESSAGE INCLUDING: ?YOUR NAME ?DATE OF BIRTH ?CALL BACK NUMBER ?REASON FOR CALL**this is important as we prioritize the call backs ? ?YOU WILL RECEIVE A CALL BACK THE SAME DAY AS LONG AS YOU CALL BEFORE 4:00 PM ? ? ?

## 2021-10-24 ENCOUNTER — Telehealth (HOSPITAL_COMMUNITY): Payer: Self-pay

## 2021-10-24 DIAGNOSIS — I5042 Chronic combined systolic (congestive) and diastolic (congestive) heart failure: Secondary | ICD-10-CM

## 2021-10-24 NOTE — Telephone Encounter (Addendum)
Pt aware, agreeable, and verbalized understanding ? ?Labs ordered for next week ? ?----- Message from Larey Dresser, MD sent at 10/23/2021  4:19 PM EDT ----- ?BNP lower ?

## 2021-10-26 NOTE — Progress Notes (Signed)
? ? ?PCP: Marianna Payment, MD ?Primary Cardiologist: Dr Tamala Julian  ?HF Cardiology: Dr. Aundra Dubin ? ?HPI: ?[name] is a 76 y.o.  male  with a PMH significant for COPD, CAD s/p PCI to mid LAD 09/2020, atrial flutter s/p ablation 2012, MDS, HTN, CKD stage 3, Tobacco use disorder, polycythemia vera, and chronic pain on suboxone.  ?  ?Cardiac history dates back to 2012 when he was diagnosed with CHF with EF 35%, initially felt to be non-ischemic in the setting of polysubstance abuse and atrial flutter. He underwent an atrial flutter ablation in 2012. EF reportedly improved to 55% in 2014, then back down again in 2020 to 35-40%. Has struggled to maintain NSR on amiodarone with breakthrough atrial fibrillation. ?  ?Multiple hospitalizations for respiratory failure since 2014.  Has very bad COPD.  Required intubation in July 2020 and September 2020. Hospitalized again in November 2021 but did not require intubation. PFTs in 2020 c/w moderate COPD. Continues to smoke cigarrettes.  ?  ?He was admitted in 09/2020 with chest pain/NSTEMI and dyspnea, found to have single vessel LAD disease and received PCI to mid LAD. Echo w/ moderately reduced LVEF at 40-45%, mod-severe MR and normal RV. He was diuresed and discharged from the hospital on metoprolol, lasix, farxiga, amiodarone, eliquis and DAPT. He was referred to [facility] and saw Dr. Shan Levans but ultimately referred back to general cardiology.  ?  ?Admitted in 2/23 with HFrEF exacerbation. He was back in atrial fibrillation. Hs trop 19>>30>>31. UDS was negative. Echo w/ EF 35-40%, RV normal. Moderate-severe MR noted, PISA ERO 0.34 cm2. Also w/ moderate AI and severe TR. He was diuresed w/ IV Lasix and noted some symptomatic improvement. Referred to TOC.  ? ?He was seen in the HF [facility] 09/09/21. Volume overloaded. Switched to torsemide and restarted Farxiga 10 mg daily.   ? ?Saw Dr Ali Lowe on 09/26/21. Set up for cath. Had [place] 10/03/21 RA 5, PA 33/13 (19), PCWP 14, CO 3.3 CI 1.6.  Plan for eventual Mitraclip.  ? ?He then had TEE-guided DCCV back to NSR in 3/23. TEE showed mild LV dilation with mild LVH and EF 45%, normal RV size and systolic function, moderate AI (vena contracta 0.43 cm and PHT around 270 msec), moderate-severe central (functional) MR with ERO 0.25 cm^2 but vena contracta area 0.7 cm^2, ascending aorta dilated to 4.5 cm.  ? ?Today he returns for HF followup. He says that breathing is better in NSR.  Today, noted to be bradycardic with HR in 40s.  Weight is up about 6 lbs since last appointment.  BP low at 90/50 though he denies lightheadedness. He still gets short of breath walking across his house.  No chest pain.  No orthopnea.  Still smoking 1-2 cigarettes/day.   ? ?Labs (3/23): K 4, creatinine 2.19, hgb 17.6 ? ?ECG (personally reviewed): sinus brady at 42, 1st degree AVB, LVH.  ? ?PMH: ?1. COPD: Active smoker.  PFTs in 2020 with moderate COPD.  ?2. CKD stage 3 ?3. CAD: NSTEMI in 3/22 with DES to proximal LAD.  ?4. Atrial flutter: S/p ablation in 2012.  ?5. Atrial fibrillation: Paroxysmal.  ?- TEE-guided DCCV in 3/23.  ?6. HTN ?7. H/o polysubstance abuse.  ?8. Polycythemia vera ?9. Chronic pain.  ?10. Ascending aortic aneurysm: 4.5 cm on TEE in 3/23.  ?11. 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   ?- Echo (2/23): EF 35-40%, normal RV, severe MR, moderate AI.  ?- RHC (3/23): mean RA 5, PA  33/13, mean PCWP 14, CI 1.6 ?- TEE (3/23): mild LV dilation with mild LVH and EF 45%, normal RV size and systolic function, moderate AI (vena contracta 0.43 cm and PHT around 270 msec), moderate-severe central (functional) MR with ERO 0.25 cm^2 but vena contracta area 0.7 cm^2, ascending aorta dilated to 4.5 cm.  ?12. Mitral regurgitation: Moderate-severe functional MR on TEE in 3/23.  ?13. Aortic insufficiency: Moderate on TEE in 3/23.  ? ?ROS: All systems negative except as listed in HPI, PMH and Problem List. ? ?SH:  ?Social History   ? ?Socioeconomic History  ? Marital status: Unknown  ?  Spouse name: [name]  ? Number of children: 1  ? Years of education: Not on file  ? Highest education level: 11th grade  ?Occupational History  ? Occupation: Retired  ?Tobacco Use  ? Smoking status: Some Days  ?  Packs/day: 1.50  ?  Years: 50.00  ?  Pack years: 75.00  ?  Types: Cigarettes  ? Smokeless tobacco: Never  ? Tobacco comments:  ?  1-2 cigarettes a day for a couple years now. encouraged cessation  ?Vaping Use  ? Vaping Use: Never used  ?Substance and Sexual Activity  ? Alcohol use: Yes  ?  Comment: Sometimes.  ? Drug use: Not Currently  ?  Types: Heroin  ?  Comment: stopped use in 2015. currently on suboxone  ? Sexual activity: Not Currently  ?Other Topics Concern  ? Not on file  ?Social History Narrative  ? ** Merged History Encounter **  ?    ? Moved here from [place]. Lives with common law wife and grandchildren. He is retired from "general labor."  ? ?Social Determinants of Health  ? ?Financial Resource Strain: Not on file  ?Food Insecurity: Not on file  ?Transportation Needs: Unmet Transportation Needs  ? Lack of Transportation (Medical): Yes  ? Lack of Transportation (Non-Medical): Yes  ?Physical Activity: Not on file  ?Stress: Not on file  ?Social Connections: Not on file  ?Intimate Partner Violence: Not on file  ? ? ?FH:  ?Family History  ?Problem Relation Age of Onset  ? Diabetes Mother   ? Hypertension Mother   ? Cirrhosis Father   ? Alcohol abuse Father   ? Colon cancer Neg Hx   ? Rectal cancer Neg Hx   ? Stomach cancer Neg Hx   ? Esophageal cancer Neg Hx   ? Colon polyps Neg Hx   ? ? ?Past Medical History:  ?Diagnosis Date  ? Acute lower GI bleeding 02/15/2017  ? Atrial fibrillation ([place])   ? Cardiomyopathy   ? EF55% 11/14<<35%   ? CHF (congestive heart failure) ([place])   ? COPD (chronic obstructive pulmonary disease) ([place])   ? Difficult airway for intubation   ? per telephone encounter notation  ? Essential hypertension   ? GERD  (gastroesophageal reflux disease)   ? Gout   ? Hepatitis   ? Possible history  ? History of atrial flutter   ? Ablation 2012  ? Hypertension   ? 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 Obesity   ? Pneumonia X 1  ? Primary polycythemia ([place]) 06/12/2013  ? Renal disorder   ? Renal insufficiency   ? Substance abuse ([place])   ? History of alcohol; hx cocaine, heroin, crack use  ? Syncope   ? Tubular adenoma of colon 08/2014  ? ? ?Current Outpatient Medications  ?Medication Sig Dispense Refill  ? acetaminophen (TYLENOL) 500 MG tablet Take 1,000 mg  by mouth every 6 (six) hours as needed for mild pain.    ? albuterol (PROAIR HFA) 108 (90 Base) MCG/ACT inhaler Inhale 1-2 puffs into the lungs every 6 (six) hours as needed for wheezing or shortness of breath. 18 g 3  ? apixaban (ELIQUIS) 5 MG TABS tablet Take 1 tablet (5 mg total) by mouth 2 (two) times daily. 60 tablet 0  ? atorvastatin (LIPITOR) 80 MG tablet Take 1 tablet (80 mg total) by mouth daily. 30 tablet 5  ? camphor-menthol (SARNA) lotion Apply topically 3 (three) times daily. 222 mL 0  ? dapagliflozin propanediol (FARXIGA) 10 MG TABS tablet Take 1 tablet (10 mg total) by mouth daily before breakfast. 90 tablet 3  ? diclofenac Sodium (VOLTAREN) 1 % GEL Apply 2 g topically 4 (four) times daily as needed (as needed for pain). 2 g 0  ? hydrocortisone cream 1 % Apply 1 application topically 2 (two) times daily. 30 g 2  ? potassium chloride SA (KLOR-CON M) 20 MEQ tablet Take 1 tablet (20 mEq total) by mouth 2 (two) times daily. 60 tablet 2  ? senna-docusate (SENOKOT-S) 8.6-50 MG tablet Take 1 tablet by mouth at bedtime as needed for mild constipation. 30 tablet 0  ? spironolactone (ALDACTONE) 25 MG tablet Take 0.5 tablets (12.5 mg total) by mouth daily. 45 tablet 3  ? SUBOXONE 8-2 MG FILM Place 2 Film under the tongue daily.    ? SYMBICORT 160-4.5 MCG/ACT inhaler Inhale 2 puffs into the lungs daily.    ? amiodarone (PACERONE) 200 MG tablet Take 1 tablet (200 mg  total) by mouth daily. 180 tablet 1  ? metoprolol succinate (TOPROL-XL) 25 MG 24 hr tablet Take 1 tablet (25 mg total) by mouth daily. 90 tablet 3  ? torsemide (DEMADEX) 20 MG tablet Take 3 tablets (60 mg total) by mouth 2 (t

## 2021-10-26 NOTE — H&P (View-Only) (Signed)
? ? ?PCP: Marianna Payment, MD ?Primary Cardiologist: Dr Tamala Julian  ?HF Cardiology: Dr. Aundra Dubin ? ?HPI: ?[name] is a 76 y.o.  male  with a PMH significant for COPD, CAD s/p PCI to mid LAD 09/2020, atrial flutter s/p ablation 2012, MDS, HTN, CKD stage 3, Tobacco use disorder, polycythemia vera, and chronic pain on suboxone.  ?  ?Cardiac history dates back to 2012 when he was diagnosed with CHF with EF 35%, initially felt to be non-ischemic in the setting of polysubstance abuse and atrial flutter. He underwent an atrial flutter ablation in 2012. EF reportedly improved to 55% in 2014, then back down again in 2020 to 35-40%. Has struggled to maintain NSR on amiodarone with breakthrough atrial fibrillation. ?  ?Multiple hospitalizations for respiratory failure since 2014.  Has very bad COPD.  Required intubation in July 2020 and September 2020. Hospitalized again in November 2021 but did not require intubation. PFTs in 2020 c/w moderate COPD. Continues to smoke cigarrettes.  ?  ?He was admitted in 09/2020 with chest pain/NSTEMI and dyspnea, found to have single vessel LAD disease and received PCI to mid LAD. Echo w/ moderately reduced LVEF at 40-45%, mod-severe MR and normal RV. He was diuresed and discharged from the hospital on metoprolol, lasix, farxiga, amiodarone, eliquis and DAPT. He was referred to [facility] and saw Dr. Shan Levans but ultimately referred back to general cardiology.  ?  ?Admitted in 2/23 with HFrEF exacerbation. He was back in atrial fibrillation. Hs trop 19>>30>>31. UDS was negative. Echo w/ EF 35-40%, RV normal. Moderate-severe MR noted, PISA ERO 0.34 cm2. Also w/ moderate AI and severe TR. He was diuresed w/ IV Lasix and noted some symptomatic improvement. Referred to TOC.  ? ?He was seen in the HF [facility] 09/09/21. Volume overloaded. Switched to torsemide and restarted Farxiga 10 mg daily.   ? ?Saw Dr Ali Lowe on 09/26/21. Set up for cath. Had [place] 10/03/21 RA 5, PA 33/13 (19), PCWP 14, CO 3.3 CI 1.6.  Plan for eventual Mitraclip.  ? ?He then had TEE-guided DCCV back to NSR in 3/23. TEE showed mild LV dilation with mild LVH and EF 45%, normal RV size and systolic function, moderate AI (vena contracta 0.43 cm and PHT around 270 msec), moderate-severe central (functional) MR with ERO 0.25 cm^2 but vena contracta area 0.7 cm^2, ascending aorta dilated to 4.5 cm.  ? ?Today he returns for HF followup. He says that breathing is better in NSR.  Today, noted to be bradycardic with HR in 40s.  Weight is up about 6 lbs since last appointment.  BP low at 90/50 though he denies lightheadedness. He still gets short of breath walking across his house.  No chest pain.  No orthopnea.  Still smoking 1-2 cigarettes/day.   ? ?Labs (3/23): K 4, creatinine 2.19, hgb 17.6 ? ?ECG (personally reviewed): sinus brady at 42, 1st degree AVB, LVH.  ? ?PMH: ?1. COPD: Active smoker.  PFTs in 2020 with moderate COPD.  ?2. CKD stage 3 ?3. CAD: NSTEMI in 3/22 with DES to proximal LAD.  ?4. Atrial flutter: S/p ablation in 2012.  ?5. Atrial fibrillation: Paroxysmal.  ?- TEE-guided DCCV in 3/23.  ?6. HTN ?7. H/o polysubstance abuse.  ?8. Polycythemia vera ?9. Chronic pain.  ?10. Ascending aortic aneurysm: 4.5 cm on TEE in 3/23.  ?11. 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   ?- Echo (2/23): EF 35-40%, normal RV, severe MR, moderate AI.  ?- RHC (3/23): mean RA 5, PA  33/13, mean PCWP 14, CI 1.6 ?- TEE (3/23): mild LV dilation with mild LVH and EF 45%, normal RV size and systolic function, moderate AI (vena contracta 0.43 cm and PHT around 270 msec), moderate-severe central (functional) MR with ERO 0.25 cm^2 but vena contracta area 0.7 cm^2, ascending aorta dilated to 4.5 cm.  ?12. Mitral regurgitation: Moderate-severe functional MR on TEE in 3/23.  ?13. Aortic insufficiency: Moderate on TEE in 3/23.  ? ?ROS: All systems negative except as listed in HPI, PMH and Problem List. ? ?SH:  ?Social History   ? ?Socioeconomic History  ? Marital status: Unknown  ?  Spouse name: [name]  ? Number of children: 1  ? Years of education: Not on file  ? Highest education level: 11th grade  ?Occupational History  ? Occupation: Retired  ?Tobacco Use  ? Smoking status: Some Days  ?  Packs/day: 1.50  ?  Years: 50.00  ?  Pack years: 75.00  ?  Types: Cigarettes  ? Smokeless tobacco: Never  ? Tobacco comments:  ?  1-2 cigarettes a day for a couple years now. encouraged cessation  ?Vaping Use  ? Vaping Use: Never used  ?Substance and Sexual Activity  ? Alcohol use: Yes  ?  Comment: Sometimes.  ? Drug use: Not Currently  ?  Types: Heroin  ?  Comment: stopped use in 2015. currently on suboxone  ? Sexual activity: Not Currently  ?Other Topics Concern  ? Not on file  ?Social History Narrative  ? ** Merged History Encounter **  ?    ? Moved here from [place]. Lives with common law wife and grandchildren. He is retired from "general labor."  ? ?Social Determinants of Health  ? ?Financial Resource Strain: Not on file  ?Food Insecurity: Not on file  ?Transportation Needs: Unmet Transportation Needs  ? Lack of Transportation (Medical): Yes  ? Lack of Transportation (Non-Medical): Yes  ?Physical Activity: Not on file  ?Stress: Not on file  ?Social Connections: Not on file  ?Intimate Partner Violence: Not on file  ? ? ?FH:  ?Family History  ?Problem Relation Age of Onset  ? Diabetes Mother   ? Hypertension Mother   ? Cirrhosis Father   ? Alcohol abuse Father   ? Colon cancer Neg Hx   ? Rectal cancer Neg Hx   ? Stomach cancer Neg Hx   ? Esophageal cancer Neg Hx   ? Colon polyps Neg Hx   ? ? ?Past Medical History:  ?Diagnosis Date  ? Acute lower GI bleeding 02/15/2017  ? Atrial fibrillation ([place])   ? Cardiomyopathy   ? EF55% 11/14<<35%   ? CHF (congestive heart failure) ([place])   ? COPD (chronic obstructive pulmonary disease) ([place])   ? Difficult airway for intubation   ? per telephone encounter notation  ? Essential hypertension   ? GERD  (gastroesophageal reflux disease)   ? Gout   ? Hepatitis   ? Possible history  ? History of atrial flutter   ? Ablation 2012  ? Hypertension   ? 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 Obesity   ? Pneumonia X 1  ? Primary polycythemia ([place]) 06/12/2013  ? Renal disorder   ? Renal insufficiency   ? Substance abuse ([place])   ? History of alcohol; hx cocaine, heroin, crack use  ? Syncope   ? Tubular adenoma of colon 08/2014  ? ? ?Current Outpatient Medications  ?Medication Sig Dispense Refill  ? acetaminophen (TYLENOL) 500 MG tablet Take 1,000 mg  by mouth every 6 (six) hours as needed for mild pain.    ? albuterol (PROAIR HFA) 108 (90 Base) MCG/ACT inhaler Inhale 1-2 puffs into the lungs every 6 (six) hours as needed for wheezing or shortness of breath. 18 g 3  ? apixaban (ELIQUIS) 5 MG TABS tablet Take 1 tablet (5 mg total) by mouth 2 (two) times daily. 60 tablet 0  ? atorvastatin (LIPITOR) 80 MG tablet Take 1 tablet (80 mg total) by mouth daily. 30 tablet 5  ? camphor-menthol (SARNA) lotion Apply topically 3 (three) times daily. 222 mL 0  ? dapagliflozin propanediol (FARXIGA) 10 MG TABS tablet Take 1 tablet (10 mg total) by mouth daily before breakfast. 90 tablet 3  ? diclofenac Sodium (VOLTAREN) 1 % GEL Apply 2 g topically 4 (four) times daily as needed (as needed for pain). 2 g 0  ? hydrocortisone cream 1 % Apply 1 application topically 2 (two) times daily. 30 g 2  ? potassium chloride SA (KLOR-CON M) 20 MEQ tablet Take 1 tablet (20 mEq total) by mouth 2 (two) times daily. 60 tablet 2  ? senna-docusate (SENOKOT-S) 8.6-50 MG tablet Take 1 tablet by mouth at bedtime as needed for mild constipation. 30 tablet 0  ? spironolactone (ALDACTONE) 25 MG tablet Take 0.5 tablets (12.5 mg total) by mouth daily. 45 tablet 3  ? SUBOXONE 8-2 MG FILM Place 2 Film under the tongue daily.    ? SYMBICORT 160-4.5 MCG/ACT inhaler Inhale 2 puffs into the lungs daily.    ? amiodarone (PACERONE) 200 MG tablet Take 1 tablet (200 mg  total) by mouth daily. 180 tablet 1  ? metoprolol succinate (TOPROL-XL) 25 MG 24 hr tablet Take 1 tablet (25 mg total) by mouth daily. 90 tablet 3  ? torsemide (DEMADEX) 20 MG tablet Take 3 tablets (60 mg total) by mouth 2 (t

## 2021-10-27 ENCOUNTER — Telehealth: Payer: Self-pay | Admitting: Cardiology

## 2021-10-27 NOTE — Telephone Encounter (Signed)
?  [facility]   ?MULTIDISCIPLINARY HEART VALVE TEAM  ? ?Called to confirm instruction letter given at follow up appointment last week. Patient received the letter. Reviewed again today over the phone. All questions answered.  ? ?Kathyrn Drown NP-C ?Structural Heart Team  ?Pager: [phone] ?Phone: [phone] ? ?

## 2021-10-29 ENCOUNTER — Other Ambulatory Visit (HOSPITAL_COMMUNITY): Payer: Medicare Other

## 2021-10-31 ENCOUNTER — Encounter (HOSPITAL_COMMUNITY): Payer: Self-pay | Admitting: Internal Medicine

## 2021-11-07 ENCOUNTER — Ambulatory Visit (HOSPITAL_BASED_OUTPATIENT_CLINIC_OR_DEPARTMENT_OTHER): Payer: Medicare Other | Admitting: Anesthesiology

## 2021-11-07 ENCOUNTER — Ambulatory Visit (HOSPITAL_COMMUNITY)
Admission: RE | Admit: 2021-11-07 | Discharge: 2021-11-07 | Disposition: A | Discharge status: Home / Self Care | Payer: Medicare Other | Attending: Internal Medicine | Admitting: Internal Medicine

## 2021-11-07 ENCOUNTER — Encounter (HOSPITAL_COMMUNITY)
Admission: RE | Disposition: A | Discharge status: Home / Self Care | Payer: Self-pay | Source: Home / Self Care | Attending: Internal Medicine

## 2021-11-07 ENCOUNTER — Other Ambulatory Visit (HOSPITAL_COMMUNITY): Payer: Self-pay | Admitting: Internal Medicine

## 2021-11-07 ENCOUNTER — Ambulatory Visit (HOSPITAL_COMMUNITY): Payer: Medicare Other | Admitting: Anesthesiology

## 2021-11-07 ENCOUNTER — Encounter (HOSPITAL_COMMUNITY): Payer: Self-pay | Admitting: Internal Medicine

## 2021-11-07 ENCOUNTER — Ambulatory Visit (HOSPITAL_BASED_OUTPATIENT_CLINIC_OR_DEPARTMENT_OTHER)
Admission: RE | Admit: 2021-11-07 | Discharge: 2021-11-07 | Disposition: A | Discharge status: Home / Self Care | Payer: Medicare Other | Source: Home / Self Care | Attending: Internal Medicine | Admitting: Internal Medicine

## 2021-11-07 ENCOUNTER — Other Ambulatory Visit: Payer: Self-pay

## 2021-11-07 DIAGNOSIS — I252 Old myocardial infarction: Secondary | ICD-10-CM | POA: Insufficient documentation

## 2021-11-07 DIAGNOSIS — I2510 Atherosclerotic heart disease of native coronary artery without angina pectoris: Secondary | ICD-10-CM | POA: Diagnosis not present

## 2021-11-07 DIAGNOSIS — I130 Hypertensive heart and chronic kidney disease with heart failure and stage 1 through stage 4 chronic kidney disease, or unspecified chronic kidney disease: Secondary | ICD-10-CM | POA: Diagnosis not present

## 2021-11-07 DIAGNOSIS — I5022 Chronic systolic (congestive) heart failure: Secondary | ICD-10-CM | POA: Diagnosis not present

## 2021-11-07 DIAGNOSIS — I083 Combined rheumatic disorders of mitral, aortic and tricuspid valves: Secondary | ICD-10-CM | POA: Diagnosis present

## 2021-11-07 DIAGNOSIS — I4891 Unspecified atrial fibrillation: Secondary | ICD-10-CM

## 2021-11-07 DIAGNOSIS — N183 Chronic kidney disease, stage 3 unspecified: Secondary | ICD-10-CM | POA: Insufficient documentation

## 2021-11-07 DIAGNOSIS — J449 Chronic obstructive pulmonary disease, unspecified: Secondary | ICD-10-CM | POA: Insufficient documentation

## 2021-11-07 DIAGNOSIS — I428 Other cardiomyopathies: Secondary | ICD-10-CM | POA: Diagnosis not present

## 2021-11-07 DIAGNOSIS — F17210 Nicotine dependence, cigarettes, uncomplicated: Secondary | ICD-10-CM | POA: Insufficient documentation

## 2021-11-07 DIAGNOSIS — I480 Paroxysmal atrial fibrillation: Secondary | ICD-10-CM | POA: Insufficient documentation

## 2021-11-07 DIAGNOSIS — I7121 Aneurysm of the ascending aorta, without rupture: Secondary | ICD-10-CM | POA: Insufficient documentation

## 2021-11-07 DIAGNOSIS — I340 Nonrheumatic mitral (valve) insufficiency: Secondary | ICD-10-CM

## 2021-11-07 DIAGNOSIS — G8929 Other chronic pain: Secondary | ICD-10-CM | POA: Insufficient documentation

## 2021-11-07 DIAGNOSIS — Z79899 Other long term (current) drug therapy: Secondary | ICD-10-CM | POA: Diagnosis not present

## 2021-11-07 HISTORY — PX: TEE WITHOUT CARDIOVERSION: SHX5443

## 2021-11-07 SURGERY — ECHOCARDIOGRAM, TRANSESOPHAGEAL
Anesthesia: Monitor Anesthesia Care

## 2021-11-07 MED ORDER — PROPOFOL 10 MG/ML IV BOLUS
INTRAVENOUS | Status: DC | PRN
Start: 2021-11-07 — End: 2021-11-07

## 2021-11-07 MED ORDER — SODIUM CHLORIDE 0.9 % IV SOLN: INTRAVENOUS | Status: DC

## 2021-11-07 MED ORDER — PROPOFOL 10 MG/ML IV BOLUS
INTRAVENOUS | Status: DC | PRN
  Administered 2021-11-07: 50 mg via INTRAVENOUS
  Administered 2021-11-07: 30 mg via INTRAVENOUS

## 2021-11-07 MED ORDER — PHENYLEPHRINE 80 MCG/ML (10ML) SYRINGE FOR IV PUSH (FOR BLOOD PRESSURE SUPPORT)
PREFILLED_SYRINGE | INTRAVENOUS | Status: DC | PRN
Start: 2021-11-07 — End: 2021-11-07
  Administered 2021-11-07: 160 ug via INTRAVENOUS
  Administered 2021-11-07: 80 ug via INTRAVENOUS
  Administered 2021-11-07: 160 ug via INTRAVENOUS

## 2021-11-07 MED ORDER — PROPOFOL 500 MG/50ML IV EMUL
INTRAVENOUS | Status: DC | PRN
  Administered 2021-11-07: 100 ug/kg/min via INTRAVENOUS

## 2021-11-07 MED ORDER — EPHEDRINE SULFATE-NACL 50-0.9 MG/10ML-% IV SOSY
PREFILLED_SYRINGE | INTRAVENOUS | Status: DC | PRN
  Administered 2021-11-07: 5 mg via INTRAVENOUS

## 2021-11-07 MED ORDER — PROPOFOL 500 MG/50ML IV EMUL: INTRAVENOUS | Status: DC | PRN

## 2021-11-07 MED ORDER — LACTATED RINGERS IV SOLN: INTRAVENOUS | Status: DC | PRN

## 2021-11-07 NOTE — Progress Notes (Signed)
?  Echocardiogram ?Echocardiogram Transesophageal has been performed. ? ?[name] ?11/07/2021, 12:22 PM ?

## 2021-11-07 NOTE — Interval H&P Note (Signed)
History and Physical Interval Note: ? ?11/07/2021 ?10:40 AM ? ?[name]  has presented today for surgery, with the diagnosis of MITRAL REGURGITATION.  The various methods of treatment have been discussed with the patient and family. After consideration of risks, benefits and other options for treatment, the patient has consented to  Procedure(s): ?TRANSESOPHAGEAL ECHOCARDIOGRAM (TEE) (N/A) as a surgical intervention.  The patient's history has been reviewed, patient examined, no change in status, stable for surgery.  I have reviewed the patient's chart and labs.  Questions were answered to the patient's satisfaction.   ? ? ?Siya Flurry A Ailey Wessling ? ? ?

## 2021-11-07 NOTE — Transfer of Care (Signed)
Immediate Anesthesia Transfer of Care Note ? ?Patient: [name] ? ?Procedure(s) Performed: TRANSESOPHAGEAL ECHOCARDIOGRAM (TEE) ? ?Patient Location: PACU ? ?Anesthesia Type:MAC ? ?Level of Consciousness: patient cooperative and responds to stimulation ? ?Airway & Oxygen Therapy: Patient Spontanous Breathing and Patient connected to nasal cannula oxygen ? ?Post-op Assessment: Report given to RN and Post -op Vital signs reviewed and stable ? ?Post vital signs: Reviewed and stable ? ?Last Vitals:  ?Vitals Value Taken Time  ?BP 106/51 11/07/21 1212  ?Temp 36.7 ?C 11/07/21 1212  ?Pulse 48 11/07/21 1214  ?Resp 14 11/07/21 1214  ?SpO2 93 % 11/07/21 1214  ?Vitals shown include unvalidated device data. ? ?Last Pain:  ?Vitals:  ? 11/07/21 1007  ?TempSrc: Temporal  ?PainSc: 0-No pain  ?   ? ?  ? ?Complications: No notable events documented. ?

## 2021-11-07 NOTE — Anesthesia Postprocedure Evaluation (Signed)
Anesthesia Post Note ? ?Patient: [name] ? ?Procedure(s) Performed: TRANSESOPHAGEAL ECHOCARDIOGRAM (TEE) ? ?  ? ?Patient location during evaluation: Endoscopy ?Anesthesia Type: MAC ?Level of consciousness: awake and alert ?Pain management: pain level controlled ?Vital Signs Assessment: post-procedure vital signs reviewed and stable ?Respiratory status: spontaneous breathing, nonlabored ventilation, respiratory function stable and patient connected to nasal cannula oxygen ?Cardiovascular status: stable and blood pressure returned to baseline ?Postop Assessment: no apparent nausea or vomiting ?Anesthetic complications: no ? ? ?No notable events documented. ? ?Last Vitals:  ?Vitals:  ? 11/07/21 1241 11/07/21 1242  ?BP: 117/60   ?Pulse: (!) 57 (!) 55  ?Resp: 15 14  ?Temp:  36.5 ?C  ?SpO2: 96% 97%  ?  ?Last Pain:  ?Vitals:  ? 11/07/21 1242  ?TempSrc:   ?PainSc: 0-No pain  ? ? ?  ?  ?  ?  ?  ?  ? ?[name] ? ? ? ? ?

## 2021-11-07 NOTE — Anesthesia Preprocedure Evaluation (Signed)
Anesthesia Evaluation  ?Patient identified by MRN, date of birth, ID band ?Patient awake ? ? ? ?Reviewed: ?Allergy & Precautions, NPO status , Patient's Chart, lab work & pertinent test results ? ?Airway ?Mallampati: II ? ?TM Distance: >3 FB ?Neck ROM: Full ? ? ? Dental ? ?(+) Upper Dentures ?  ?Pulmonary ?COPD,  COPD inhaler, Current Smoker and Patient abstained from smoking.,  ?  ?Pulmonary exam normal ? ? ? ? ? ? ? Cardiovascular ?hypertension, Pt. on medications and Pt. on home beta blockers ?+ Past MI, + Peripheral Vascular Disease and +CHF  ?+ Valvular Problems/Murmurs MR  ?Rhythm:Regular Rate:Normal ? ? ?  ?Neuro/Psych ?negative neurological ROS ? negative psych ROS  ? GI/Hepatic ?GERD  ,(+)  ?  ? substance abuse ? alcohol use and cocaine use,   ?Endo/Other  ?negative endocrine ROS ? Renal/GU ?  ?negative genitourinary ?  ?Musculoskeletal ?negative musculoskeletal ROS ?(+)  ? Abdominal ?Normal abdominal exam  (+)   ?Peds ? Hematology ? ?(+) Blood dyscrasia, anemia ,   ?Anesthesia Other Findings ? ? Reproductive/Obstetrics ? ?  ? ? ? ? ? ? ? ? ? ? ? ? ? ?  ?  ? ? ? ? ? ? ? ? ?Anesthesia Physical ?Anesthesia Plan ? ?ASA: 3 ? ?Anesthesia Plan: MAC  ? ?Post-op Pain Management:   ? ?Induction: Intravenous ? ?PONV Risk Score and Plan: 1 and Propofol infusion and Treatment may vary due to age or medical condition ? ?Airway Management Planned: Simple Face Mask, Natural Airway and Nasal Cannula ? ?Additional Equipment: None ? ?Intra-op Plan:  ? ?Post-operative Plan:  ? ?Informed Consent: I have reviewed the patients History and Physical, chart, labs and discussed the procedure including the risks, benefits and alternatives for the proposed anesthesia with the patient or authorized representative who has indicated his/her understanding and acceptance.  ? ? ? ?Dental advisory given ? ?Plan Discussed with:  ? ?Anesthesia Plan Comments:   ? ? ? ? ? ? ?Anesthesia Quick Evaluation ? ?

## 2021-11-07 NOTE — CV Procedure (Addendum)
? ? ?  TRANSESOPHAGEAL ECHOCARDIOGRAM  ? ?NAME:  [name]    ?MRN: [id number] ?DOB:  04/05/1946    ?ADMIT DATE: 11/07/2021 ? ?INDICATIONS: ?Mitral Valve Regurgitation ? ?PROCEDURE:  ? ?Informed consent was obtained prior to the procedure. The risks, benefits and alternatives for the procedure were discussed and the patient comprehended these risks.  Risks include, but are not limited to, cough, sore throat, vomiting, nausea, somnolence, esophageal and stomach trauma or perforation, bleeding, low blood pressure, aspiration, pneumonia, infection, trauma to the teeth and death.   ? ?Procedural time out performed. The oropharynx was anesthetized with topical 1% benzocaine.   ? ?Anesthesia was administered by Dr. Rex Kras and team.  The patient was administered a total of Propofol  280 mgto achieve and maintain moderate to deep conscious sedation.  The patient's heart rate, blood pressure, and oxygen saturation are monitored continuously during the procedure. The period of conscious sedation is 20 minutes, of which I was present face-to-face 100% of this time.  ? ?The transesophageal probe was inserted in the esophagus and stomach without difficulty and multiple views were obtained.  ? ?COMPLICATIONS:   ? ?There were no immediate complications. ? ?KEY FINDINGS: ? ?Severe post inflammatory mitral regurgitation. ?Severe post inflammatory aortic regurgitation. 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 ?Measurements for TEER to follow.  ?Full report to follow. ?Further management per primary team.  ? ?Rudean Haskell, MD ?[name]  ?12:22 PM ? ? ?

## 2021-11-11 ENCOUNTER — Encounter (HOSPITAL_COMMUNITY): Payer: Self-pay | Admitting: Internal Medicine

## 2021-11-11 ENCOUNTER — Telehealth: Payer: Self-pay

## 2021-11-11 LAB — ECHO TEE
AV Mean grad: 5 mmHg
AV Peak grad: 10.9 mmHg
Ao pk vel: 1.65 m/s
MV M vel: 4.84 m/s
MV Peak grad: 93.7 mmHg
Radius: 0.8 cm

## 2021-11-11 NOTE — Telephone Encounter (Signed)
Per Dr. Ali Lowe, called the patient again to arrange follow-up to further discuss MR treatment (there have been several calls throughout the day). ?Each time, the phone rings and rings and no one or VM picks up to leave a message. ? ?Will try again later. ?

## 2021-11-11 NOTE — Telephone Encounter (Signed)
Per [employer] review: ?"For patient AC: This is secondary MR and does not require a surgical consult ? ?This looks like a clippable valve. The fossa looks approachable for transseptal puncture in the SAXB view. TR is noted. LA dimensions are large enough for device steering and straddle. The MR jet is broad based and centrally located. The posterior leaflet measures 1.7-1.9 cm in the LVOT grasping view. Gradient measures 2 mmHg (51 bpm); MVA measures a range of  6.31 cm2 to 7.78 cm2 . Based on the functional nature of the MR and the potential gradient concern, I'd start with an NTW and assess for gradient." ? ? ?

## 2021-11-12 ENCOUNTER — Other Ambulatory Visit: Payer: Self-pay

## 2021-11-12 DIAGNOSIS — I340 Nonrheumatic mitral (valve) insufficiency: Secondary | ICD-10-CM

## 2021-11-12 NOTE — Telephone Encounter (Signed)
Discussed with Dr. Ali Lowe again, who says the patient may proceed with Clip on 5/4. ? ?Scheduled the patient with Dr. Ali Lowe for follow-up 4/28 at 0920. ?[name] understands he will get instructions for PAT and MitraClip for next Thursday at that visit. ?He was grateful for call and agrees with plan.  ?

## 2021-11-13 NOTE — Progress Notes (Signed)
? ? ?Patient ID: ?[place] ?MRN: [id number] ?DOB/AGE: [birth year] 76 y.o. ? ?Primary Care Physician:Coe, [name], MD ?Primary Cardiologist: Cristopher Peru ?Referring Cardiologist: Loralie Champagne ? ? ?FOCUSED CARDIOVASCULAR PROBLEM LIST:   ?1.  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  Coronary artery disease status post PCI of the mid LAD ?3.  COPD ?4.  Atrial flutter status post ablation ?5.  Hypertension ?6.  Chronic kidney disease stage IIIa ?7.  Atrial fibrillation on apixaban ?8.  Severe mitral regurgitation (mixed etiology) ?9.  Severe tricuspid regurgitation ?10.  Ambulates with cane ? ?HISTORY OF PRESENT ILLNESS: ? ?The patient returns for follow-up.  In the interim since I saw him last he had a TEE which demonstrated severe mixed etiology mitral regurgitation with postinflammatory changes.  His mean mitral gradient was 2 mmHg in the face of moderate to severe aortic insufficiency.  The mitral valve area was also sufficient at 6 cm?.  The patient continues to endorse lifestyle limiting shortness of breath with NYHA class II-III symptoms.  He is not short of breath at rest.  He has required no emergency room visits or hospitalizations.  He has had no signs or symptoms of stroke.  He denies any peripheral edema or paroxysmal nocturnal dyspnea.  He is interested in being less short of breath. ? ?Past Medical History:  ?Diagnosis Date  ? Acute lower GI bleeding 02/15/2017  ? Atrial fibrillation ([place])   ? Cardiomyopathy   ? EF55% 11/14<<35%   ? CHF (congestive heart failure) ([place])   ? COPD (chronic obstructive pulmonary disease) ([place])   ? Difficult airway for intubation   ? per telephone encounter notation  ? Essential hypertension   ? GERD (gastroesophageal reflux disease)   ? Gout   ? Hepatitis   ? Possible history  ? History of atrial flutter   ? Ablation 2012  ? Hypertension   ? 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 Obesity   ? Pneumonia X 1  ?  Primary polycythemia ([place]) 06/12/2013  ? Renal disorder   ? Renal insufficiency   ? Substance abuse ([place])   ? History of alcohol; hx cocaine, heroin, crack use  ? Syncope   ? Tubular adenoma of colon 08/2014  ?  ?Past Surgical History:  ?Procedure Laterality Date  ? CARDIAC ELECTROPHYSIOLOGY MAPPING AND ABLATION  08/2010  ? [name] 09/07/2010 (12/14/2012)  ? CARDIAC SURGERY    ? CARDIOVERSION N/A 10/16/2021  ? Procedure: CARDIOVERSION;  Surgeon: Larey Dresser, MD;  Location: [facility] ENDOSCOPY;  Service: Cardiovascular;  Laterality: N/A;  ? COLONOSCOPY    ? 15-20 years ago had colon in [place]  ? COLONOSCOPY WITH PROPOFOL N/A 02/12/2020  ? Procedure: COLONOSCOPY WITH PROPOFOL;  Surgeon: Ladene Artist, MD;  Location: [facility] ENDOSCOPY;  Service: Endoscopy;  Laterality: N/A;  ? CORONARY STENT INTERVENTION N/A 10/10/2020  ? Procedure: CORONARY STENT INTERVENTION;  Surgeon: Lorretta Harp, MD;  Location: [place] CV LAB;  Service: Cardiovascular;  Laterality: N/A;  ? EXCISIONAL HEMORRHOIDECTOMY  1970's  ? LOOP RECORDER IMPLANT N/A 08/23/2013  ? Procedure: LOOP RECORDER IMPLANT;  Surgeon: Deboraha Sprang, MD;  Location: [facility] CATH LAB;  Service: Cardiovascular;  Laterality: N/A;  ? MULTIPLE EXTRACTIONS WITH ALVEOLOPLASTY Bilateral 01/24/2016  ? Procedure: MULTIPLE EXTRACTION WITH ALVEOLOPLASTY BILATERAL;  Surgeon: Diona Browner, DDS;  Location: [place];  Service: Oral Surgery;  Laterality: Bilateral;  ? MULTIPLE TOOTH EXTRACTIONS  01/24/2016  ? MULTIPLE EXTRACTION WITH ALVEOLOPLASTY BILATERAL (Bilateral)  ?  POLYPECTOMY  02/12/2020  ? Procedure: POLYPECTOMY;  Surgeon: Ladene Artist, MD;  Location: [facility] ENDOSCOPY;  Service: Endoscopy;;  ? RIGHT HEART CATH N/A 10/03/2021  ? Procedure: RIGHT HEART CATH;  Surgeon: [name], MD;  Location: [place] CV LAB;  Service: Cardiovascular;  Laterality: N/A;  ? RIGHT/LEFT HEART CATH AND CORONARY ANGIOGRAPHY N/A 10/09/2020  ? Procedure: RIGHT/LEFT HEART CATH AND CORONARY ANGIOGRAPHY;  Surgeon:  Burnell Blanks, MD;  Location: [place] CV LAB;  Service: Cardiovascular;  Laterality: N/A;  ? TEE WITHOUT CARDIOVERSION N/A 10/16/2021  ? Procedure: TRANSESOPHAGEAL ECHOCARDIOGRAM (TEE);  Surgeon: Larey Dresser, MD;  Location: [facility] ENDOSCOPY;  Service: Cardiovascular;  Laterality: N/A;  ? TEE WITHOUT CARDIOVERSION N/A 11/07/2021  ? Procedure: TRANSESOPHAGEAL ECHOCARDIOGRAM (TEE);  Surgeon: Werner Lean, MD;  Location: [facility];  Service: Cardiovascular;  Laterality: N/A;  ?  ?Family History  ?Problem Relation Age of Onset  ? Diabetes Mother   ? Hypertension Mother   ? Cirrhosis Father   ? Alcohol abuse Father   ? Colon cancer Neg Hx   ? Rectal cancer Neg Hx   ? Stomach cancer Neg Hx   ? Esophageal cancer Neg Hx   ? Colon polyps Neg Hx   ?  ?Social History  ? ?Socioeconomic History  ? Marital status: Unknown  ?  Spouse name: [name]  ? Number of children: 1  ? Years of education: Not on file  ? Highest education level: 11th grade  ?Occupational History  ? Occupation: Retired  ?Tobacco Use  ? Smoking status: Some Days  ?  Packs/day: 1.50  ?  Years: 50.00  ?  Pack years: 75.00  ?  Types: Cigarettes  ? Smokeless tobacco: Never  ? Tobacco comments:  ?  1-2 cigarettes a day for a couple years now. encouraged cessation  ?Vaping Use  ? Vaping Use: Never used  ?Substance and Sexual Activity  ? Alcohol use: Yes  ?  Comment: Sometimes.  ? Drug use: Not Currently  ?  Types: Heroin  ?  Comment: stopped use in 2015. currently on suboxone  ? Sexual activity: Not Currently  ?Other Topics Concern  ? Not on file  ?Social History Narrative  ? ** Merged History Encounter **  ?    ? Moved here from [place]. Lives with common law wife and grandchildren. He is retired from "general labor."  ? ?Social Determinants of Health  ? ?Financial Resource Strain: Not on file  ?Food Insecurity: Not on file  ?Transportation Needs: Unmet Transportation Needs  ? Lack of Transportation (Medical): Yes  ? Lack of Transportation  (Non-Medical): Yes  ?Physical Activity: Not on file  ?Stress: Not on file  ?Social Connections: Not on file  ?Intimate Partner Violence: Not on file  ?  ? ?Prior to Admission medications   ?Medication Sig Start Date End Date Taking? Authorizing Provider  ?acetaminophen (TYLENOL) 500 MG tablet Take 1,000 mg by mouth every 6 (six) hours as needed for mild pain.    [provider]  ?albuterol (PROAIR HFA) 108 (90 Base) MCG/ACT inhaler Inhale 1-2 puffs into the lungs every 6 (six) hours as needed for wheezing or shortness of breath. 01/11/19   [name], MD  ?amiodarone (PACERONE) 400 MG tablet Take 1 tablet (400 mg total) by mouth 2 (two) times daily. 09/24/21   Marianna Payment, MD  ?amiodarone (PACERONE) 400 MG tablet Take 400 mg by mouth daily. 09/02/21   [provider]  ?apixaban Arne Cleveland) 5  MG TABS tablet Take 1 tablet (5 mg total) by mouth 2 (two) times daily. 04/26/21   Jose Persia, MD  ?atorvastatin (LIPITOR) 80 MG tablet Take 1 tablet (80 mg total) by mouth daily. 04/26/21   Jose Persia, MD  ?atorvastatin (LIPITOR) 80 MG tablet Take 80 mg by mouth daily. 07/31/21   [provider]  ?camphor-menthol Timoteo Ace) lotion Apply topically 3 (three) times daily. 02/13/19   Kayleen Memos, DO  ?clopidogrel (PLAVIX) 75 MG tablet TAKE 1 TABLET(75 MG) BY MOUTH DAILY 09/03/21   Marianna Payment, MD  ?dapagliflozin propanediol (FARXIGA) 10 MG TABS tablet Take 1 tablet (10 mg total) by mouth daily before breakfast. 09/09/21   [name], PA-C  ?diclofenac Sodium (VOLTAREN) 1 % GEL Apply 2 g topically 4 (four) times daily as needed (as needed for pain). 08/31/21   [name], MD  ?Arne Cleveland 5 MG TABS tablet Take 5 mg by mouth daily. 04/26/21   [provider]  ?furosemide (LASIX) 40 MG tablet Take 40 mg by mouth daily. 09/03/21   [provider]  ?hydrocortisone cream 1 % Apply 1 application topically 2 (two) times daily. 09/04/21   [name], MD  ?metoprolol succinate  (TOPROL-XL) 25 MG 24 hr tablet Take 2 tablets (50 mg total) by mouth daily. 09/04/21   [name], MD  ?metoprolol succinate (TOPROL-XL) 25 MG 24 hr tablet Take 25 mg by mouth daily. 09/03/21   Provid

## 2021-11-13 NOTE — H&P (View-Only) (Signed)
? ? ?Patient ID: ?[place] ?MRN: [id number] ?DOB/AGE: Apr 15, 1946 76 y.o. ? ?Primary Care Physician:Coe, [name], MD ?Primary Cardiologist: Cristopher Peru ?Referring Cardiologist: Loralie Champagne ? ? ?FOCUSED CARDIOVASCULAR PROBLEM LIST:   ?1.  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  Coronary artery disease status post PCI of the mid LAD ?3.  COPD ?4.  Atrial flutter status post ablation ?5.  Hypertension ?6.  Chronic kidney disease stage IIIa ?7.  Atrial fibrillation on apixaban ?8.  Severe mitral regurgitation (mixed etiology) ?9.  Severe tricuspid regurgitation ?10.  Ambulates with cane ? ?HISTORY OF PRESENT ILLNESS: ? ?The patient returns for follow-up.  In the interim since I saw him last he had a TEE which demonstrated severe mixed etiology mitral regurgitation with postinflammatory changes.  His mean mitral gradient was 2 mmHg in the face of moderate to severe aortic insufficiency.  The mitral valve area was also sufficient at 6 cm?.  The patient continues to endorse lifestyle limiting shortness of breath with NYHA class II-III symptoms.  He is not short of breath at rest.  He has required no emergency room visits or hospitalizations.  He has had no signs or symptoms of stroke.  He denies any peripheral edema or paroxysmal nocturnal dyspnea.  He is interested in being less short of breath. ? ?Past Medical History:  ?Diagnosis Date  ? Acute lower GI bleeding 02/15/2017  ? Atrial fibrillation ([place])   ? Cardiomyopathy   ? EF55% 11/14<<35%   ? CHF (congestive heart failure) ([place])   ? COPD (chronic obstructive pulmonary disease) ([place])   ? Difficult airway for intubation   ? per telephone encounter notation  ? Essential hypertension   ? GERD (gastroesophageal reflux disease)   ? Gout   ? Hepatitis   ? Possible history  ? History of atrial flutter   ? Ablation 2012  ? Hypertension   ? 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 Obesity   ? Pneumonia X 1  ?  Primary polycythemia ([place]) 06/12/2013  ? Renal disorder   ? Renal insufficiency   ? Substance abuse ([place])   ? History of alcohol; hx cocaine, heroin, crack use  ? Syncope   ? Tubular adenoma of colon 08/2014  ?  ?Past Surgical History:  ?Procedure Laterality Date  ? CARDIAC ELECTROPHYSIOLOGY MAPPING AND ABLATION  08/2010  ? [name] 09/07/2010 (12/14/2012)  ? CARDIAC SURGERY    ? CARDIOVERSION N/A 10/16/2021  ? Procedure: CARDIOVERSION;  Surgeon: Larey Dresser, MD;  Location: [facility] ENDOSCOPY;  Service: Cardiovascular;  Laterality: N/A;  ? COLONOSCOPY    ? 15-20 years ago had colon in [place]  ? COLONOSCOPY WITH PROPOFOL N/A 02/12/2020  ? Procedure: COLONOSCOPY WITH PROPOFOL;  Surgeon: Ladene Artist, MD;  Location: [facility] ENDOSCOPY;  Service: Endoscopy;  Laterality: N/A;  ? CORONARY STENT INTERVENTION N/A 10/10/2020  ? Procedure: CORONARY STENT INTERVENTION;  Surgeon: Lorretta Harp, MD;  Location: [place] CV LAB;  Service: Cardiovascular;  Laterality: N/A;  ? EXCISIONAL HEMORRHOIDECTOMY  1970's  ? LOOP RECORDER IMPLANT N/A 08/23/2013  ? Procedure: LOOP RECORDER IMPLANT;  Surgeon: Deboraha Sprang, MD;  Location: [facility] CATH LAB;  Service: Cardiovascular;  Laterality: N/A;  ? MULTIPLE EXTRACTIONS WITH ALVEOLOPLASTY Bilateral 01/24/2016  ? Procedure: MULTIPLE EXTRACTION WITH ALVEOLOPLASTY BILATERAL;  Surgeon: Diona Browner, DDS;  Location: [facility];  Service: Oral Surgery;  Laterality: Bilateral;  ? MULTIPLE TOOTH EXTRACTIONS  01/24/2016  ? MULTIPLE EXTRACTION WITH ALVEOLOPLASTY BILATERAL (Bilateral)  ?  POLYPECTOMY  02/12/2020  ? Procedure: POLYPECTOMY;  Surgeon: Ladene Artist, MD;  Location: [facility] ENDOSCOPY;  Service: Endoscopy;;  ? RIGHT HEART CATH N/A 10/03/2021  ? Procedure: RIGHT HEART CATH;  Surgeon: [name], MD;  Location: [facility] CV LAB;  Service: Cardiovascular;  Laterality: N/A;  ? RIGHT/LEFT HEART CATH AND CORONARY ANGIOGRAPHY N/A 10/09/2020  ? Procedure: RIGHT/LEFT HEART CATH AND CORONARY ANGIOGRAPHY;  Surgeon:  Burnell Blanks, MD;  Location: [facility] CV LAB;  Service: Cardiovascular;  Laterality: N/A;  ? TEE WITHOUT CARDIOVERSION N/A 10/16/2021  ? Procedure: TRANSESOPHAGEAL ECHOCARDIOGRAM (TEE);  Surgeon: Larey Dresser, MD;  Location: [facility] ENDOSCOPY;  Service: Cardiovascular;  Laterality: N/A;  ? TEE WITHOUT CARDIOVERSION N/A 11/07/2021  ? Procedure: TRANSESOPHAGEAL ECHOCARDIOGRAM (TEE);  Surgeon: Werner Lean, MD;  Location: [place];  Service: Cardiovascular;  Laterality: N/A;  ?  ?Family History  ?Problem Relation Age of Onset  ? Diabetes Mother   ? Hypertension Mother   ? Cirrhosis Father   ? Alcohol abuse Father   ? Colon cancer Neg Hx   ? Rectal cancer Neg Hx   ? Stomach cancer Neg Hx   ? Esophageal cancer Neg Hx   ? Colon polyps Neg Hx   ?  ?Social History  ? ?Socioeconomic History  ? Marital status: Unknown  ?  Spouse name: [name]  ? Number of children: 1  ? Years of education: Not on file  ? Highest education level: 11th grade  ?Occupational History  ? Occupation: Retired  ?Tobacco Use  ? Smoking status: Some Days  ?  Packs/day: 1.50  ?  Years: 50.00  ?  Pack years: 75.00  ?  Types: Cigarettes  ? Smokeless tobacco: Never  ? Tobacco comments:  ?  1-2 cigarettes a day for a couple years now. encouraged cessation  ?Vaping Use  ? Vaping Use: Never used  ?Substance and Sexual Activity  ? Alcohol use: Yes  ?  Comment: Sometimes.  ? Drug use: Not Currently  ?  Types: Heroin  ?  Comment: stopped use in 2015. currently on suboxone  ? Sexual activity: Not Currently  ?Other Topics Concern  ? Not on file  ?Social History Narrative  ? ** Merged History Encounter **  ?    ? Moved here from [place]. Lives with common law wife and grandchildren. He is retired from "general labor."  ? ?Social Determinants of Health  ? ?Financial Resource Strain: Not on file  ?Food Insecurity: Not on file  ?Transportation Needs: Unmet Transportation Needs  ? Lack of Transportation (Medical): Yes  ? Lack of Transportation  (Non-Medical): Yes  ?Physical Activity: Not on file  ?Stress: Not on file  ?Social Connections: Not on file  ?Intimate Partner Violence: Not on file  ?  ? ?Prior to Admission medications   ?Medication Sig Start Date End Date Taking? Authorizing Provider  ?acetaminophen (TYLENOL) 500 MG tablet Take 1,000 mg by mouth every 6 (six) hours as needed for mild pain.    [provider]  ?albuterol (PROAIR HFA) 108 (90 Base) MCG/ACT inhaler Inhale 1-2 puffs into the lungs every 6 (six) hours as needed for wheezing or shortness of breath. 01/11/19   [name], MD  ?amiodarone (PACERONE) 400 MG tablet Take 1 tablet (400 mg total) by mouth 2 (two) times daily. 09/24/21   Marianna Payment, MD  ?amiodarone (PACERONE) 400 MG tablet Take 400 mg by mouth daily. 09/02/21   [provider]  ?apixaban Arne Cleveland) 5  MG TABS tablet Take 1 tablet (5 mg total) by mouth 2 (two) times daily. 04/26/21   Jose Persia, MD  ?atorvastatin (LIPITOR) 80 MG tablet Take 1 tablet (80 mg total) by mouth daily. 04/26/21   Jose Persia, MD  ?atorvastatin (LIPITOR) 80 MG tablet Take 80 mg by mouth daily. 07/31/21   [provider]  ?camphor-menthol Timoteo Ace) lotion Apply topically 3 (three) times daily. 02/13/19   Kayleen Memos, DO  ?clopidogrel (PLAVIX) 75 MG tablet TAKE 1 TABLET(75 MG) BY MOUTH DAILY 09/03/21   Marianna Payment, MD  ?dapagliflozin propanediol (FARXIGA) 10 MG TABS tablet Take 1 tablet (10 mg total) by mouth daily before breakfast. 09/09/21   [name], PA-C  ?diclofenac Sodium (VOLTAREN) 1 % GEL Apply 2 g topically 4 (four) times daily as needed (as needed for pain). 08/31/21   [name], MD  ?Arne Cleveland 5 MG TABS tablet Take 5 mg by mouth daily. 04/26/21   [provider]  ?furosemide (LASIX) 40 MG tablet Take 40 mg by mouth daily. 09/03/21   [provider]  ?hydrocortisone cream 1 % Apply 1 application topically 2 (two) times daily. 09/04/21   [name], MD  ?metoprolol succinate  (TOPROL-XL) 25 MG 24 hr tablet Take 2 tablets (50 mg total) by mouth daily. 09/04/21   [name], MD  ?metoprolol succinate (TOPROL-XL) 25 MG 24 hr tablet Take 25 mg by mouth daily. 09/03/21   Provid

## 2021-11-14 ENCOUNTER — Telehealth (HOSPITAL_COMMUNITY): Payer: Self-pay | Admitting: Licensed Clinical Social Worker

## 2021-11-14 ENCOUNTER — Ambulatory Visit (INDEPENDENT_AMBULATORY_CARE_PROVIDER_SITE_OTHER): Payer: Medicare Other | Admitting: Internal Medicine

## 2021-11-14 ENCOUNTER — Encounter: Payer: Self-pay | Admitting: Internal Medicine

## 2021-11-14 VITALS — BP 120/60 | HR 74 | Ht 66.0 in | Wt 198.0 lb

## 2021-11-14 DIAGNOSIS — I340 Nonrheumatic mitral (valve) insufficiency: Secondary | ICD-10-CM | POA: Diagnosis not present

## 2021-11-14 NOTE — Telephone Encounter (Signed)
CSW consulted to help get pt to procedure next week on 5/4.  CSW spoke with pt and confirmed need for transport- taxi arranged to bring pt to hospital- ride requested for 5am on 5/4 as pt needs to be at the hospital at 5:30am.  Pt provided with taxi company number in case there are problems that morning. ? ?Will continue to follow and assist as needed ? ?Jorge Ny, LCSW ?Clinical Social Worker ?[facility] ?Desk#: [phone] ?Cell#: [phone] ? ?

## 2021-11-14 NOTE — Patient Instructions (Signed)
Medication Instructions:  ?No changes today. ?*If you need a refill on your cardiac medications before your next appointment, please call your pharmacy* ? ? ?Lab Work: ?none ?If you have labs (blood work) drawn today and your tests are completely normal, you will receive your results only by: ?MyChart Message (if you have MyChart) OR ?A paper copy in the mail ?If you have any lab test that is abnormal or we need to change your treatment, we will call you to review the results. ? ? ?Testing/Procedures: ?none ? ? ?Follow-Up: ?Per Structural Heart Team ? ?

## 2021-11-17 NOTE — Progress Notes (Signed)
Surgical Instructions ? ? ? Your procedure is scheduled on Thursday, May 4th, 2023. ? ? Report to [facility] Main Entrance "A" at 05:45 A.M., then check in with the Admitting office. ? Call this number if you have problems the morning of surgery: ? [phone] ? ? If you have any questions prior to your surgery date call [phone]: Open Monday-Friday 8am-4pm ? ? ? Remember: ? Do not eat or drink after midnight the night before your surgery ?  ? Take these medicines the morning of surgery with A SIP OF WATER:  ? ?STOP ELIQUIS after your night time dose on 5/1. HOLD TORSEMIDE the night before and morning of your procedure. You may take your other medications as directed with a sip of water. ? ?Hold dapagliflozin propanediol (FARXIGA) the day before surgery and the day of surgery. ? ?amiodarone (PACERONE)  ?atorvastatin (LIPITOR) ?metoprolol succinate (TOPROL-XL) ?SYMBICORT ? ?If needed: ? ?acetaminophen (TYLENOL)  ?albuterol (PROAIR HFA) ? ?Please bring all inhalers with you the day of surgery.   ? ?As of today, STOP taking any Aspirin (unless otherwise instructed by your surgeon) Aleve, Naproxen, Ibuprofen, Motrin, Advil, Goody's, BC's, all herbal medications, fish oil, and all vitamins. ? ? ? The day of surgery: ?         ?Do not wear jewelry  ?Do not wear lotions, powders, colognes, or deodorant. ?Men may shave face and neck. ?Do not bring valuables to the hospital. ? ? ?[facility] is not responsible for any belongings or valuables. .  ? ?Do NOT Smoke (Tobacco/Vaping)  24 hours prior to your procedure ? ?If you use a CPAP at night, you may bring your mask for your overnight stay. ?  ?Contacts, glasses, hearing aids, dentures or partials may not be worn into surgery, please bring cases for these belongings ?  ?For patients admitted to the hospital, discharge time will be determined by your treatment team. ?  ?Patients discharged the day of surgery will not be allowed to drive home, and someone needs to stay  with them for 24 hours. ? ? ?SURGICAL WAITING ROOM VISITATION ?Patients having surgery or a procedure in a hospital may have two support people. ?Children under the age of 22 must have an adult with them who is not the patient. ?They may stay in the waiting area during the procedure and may switch out with other visitors. If the patient needs to stay at the hospital during part of their recovery, the visitor guidelines for inpatient rooms apply. ? ?Please refer to the [facility] [place] website for the visitor guidelines for Inpatients (after your surgery is over and you are in a regular room).  ? ? ? ? ? ?Special instructions:   ? ?Oral Hygiene is also important to reduce your risk of infection.  Remember - BRUSH YOUR TEETH THE MORNING OF SURGERY WITH YOUR REGULAR TOOTHPASTE ? ? ?[facility]- Preparing For Surgery ? ?Before surgery, you can play an important role. Because skin is not sterile, your skin needs to be as free of germs as possible. You can reduce the number of germs on your skin by washing with CHG (chlorahexidine gluconate) Soap before surgery.  CHG is an antiseptic cleaner which kills germs and bonds with the skin to continue killing germs even after washing.   ? ? ?Please do not use if you have an allergy to CHG or antibacterial soaps. If your skin becomes reddened/irritated stop using the CHG.  ?Do not shave (including legs and underarms) for at  least 48 hours prior to first CHG shower. It is OK to shave your face. ? ?Please follow these instructions carefully. ?  ? ? Shower the NIGHT BEFORE SURGERY and the MORNING OF SURGERY with CHG Soap.  ? If you chose to wash your hair, wash your hair first as usual with your normal shampoo. After you shampoo, rinse your hair and body thoroughly to remove the shampoo.  Then [employer] and genitals (private parts) with your normal soap and rinse thoroughly to remove soap. ? ?After that Use CHG Soap as you would any other liquid soap. You can apply CHG directly to the  skin and wash gently with a scrungie or a clean washcloth.  ? ?Apply the CHG Soap to your body ONLY FROM THE NECK DOWN.  Do not use on open wounds or open sores. Avoid contact with your eyes, ears, mouth and genitals (private parts). Wash Face and genitals (private parts)  with your normal soap.  ? ?Wash thoroughly, paying special attention to the area where your surgery will be performed. ? ?Thoroughly rinse your body with warm water from the neck down. ? ?DO NOT shower/wash with your normal soap after using and rinsing off the CHG Soap. ? ?Pat yourself dry with a CLEAN TOWEL. ? ?Wear CLEAN PAJAMAS to bed the night before surgery ? ?Place CLEAN SHEETS on your bed the night before your surgery ? ?DO NOT SLEEP WITH PETS. ? ? ?Day of Surgery: ? ?Take a shower with CHG soap. ?Wear Clean/Comfortable clothing the morning of surgery ?Do not apply any deodorants/lotions.   ?Remember to brush your teeth WITH YOUR REGULAR TOOTHPASTE. ? ? ? ?If you received a COVID test during your pre-op visit, it is requested that you wear a mask when out in public, stay away from anyone that may not be feeling well, and notify your surgeon if you develop symptoms. If you have been in contact with anyone that has tested positive in the last 10 days, please notify your surgeon. ? ?  ?Please read over the following fact sheets that you were given.   ?

## 2021-11-18 ENCOUNTER — Encounter (HOSPITAL_COMMUNITY)
Admission: RE | Admit: 2021-11-18 | Discharge: 2021-11-18 | Disposition: A | Discharge status: Home / Self Care | Payer: Medicare Other | Source: Ambulatory Visit | Attending: Internal Medicine | Admitting: Internal Medicine

## 2021-11-18 ENCOUNTER — Other Ambulatory Visit: Payer: Self-pay

## 2021-11-18 ENCOUNTER — Encounter (HOSPITAL_COMMUNITY): Payer: Self-pay

## 2021-11-18 ENCOUNTER — Ambulatory Visit (HOSPITAL_BASED_OUTPATIENT_CLINIC_OR_DEPARTMENT_OTHER)
Admission: RE | Admit: 2021-11-18 | Discharge: 2021-11-18 | Disposition: A | Discharge status: Home / Self Care | Payer: Medicare Other | Source: Ambulatory Visit | Attending: Internal Medicine | Admitting: Internal Medicine

## 2021-11-18 VITALS — BP 111/58 | HR 56 | Temp 97.5°F | Resp 19 | Ht 66.0 in | Wt 199.2 lb

## 2021-11-18 DIAGNOSIS — Z006 Encounter for examination for normal comparison and control in clinical research program: Secondary | ICD-10-CM | POA: Diagnosis not present

## 2021-11-18 DIAGNOSIS — Z20822 Contact with and (suspected) exposure to covid-19: Secondary | ICD-10-CM | POA: Insufficient documentation

## 2021-11-18 DIAGNOSIS — I340 Nonrheumatic mitral (valve) insufficiency: Secondary | ICD-10-CM

## 2021-11-18 DIAGNOSIS — I130 Hypertensive heart and chronic kidney disease with heart failure and stage 1 through stage 4 chronic kidney disease, or unspecified chronic kidney disease: Secondary | ICD-10-CM | POA: Diagnosis not present

## 2021-11-18 DIAGNOSIS — I5043 Acute on chronic combined systolic (congestive) and diastolic (congestive) heart failure: Secondary | ICD-10-CM | POA: Diagnosis not present

## 2021-11-18 DIAGNOSIS — Z01818 Encounter for other preprocedural examination: Secondary | ICD-10-CM | POA: Insufficient documentation

## 2021-11-18 DIAGNOSIS — I083 Combined rheumatic disorders of mitral, aortic and tricuspid valves: Secondary | ICD-10-CM | POA: Diagnosis not present

## 2021-11-18 HISTORY — DX: Dyspnea, unspecified: R06.00

## 2021-11-18 LAB — COMPREHENSIVE METABOLIC PANEL
ALT: 35 U/L (ref 0–44)
AST: 31 U/L (ref 15–41)
Albumin: 3 g/dL — ABNORMAL LOW (ref 3.5–5.0)
Alkaline Phosphatase: 121 U/L (ref 38–126)
Anion gap: 8 (ref 5–15)
BUN: 56 mg/dL — ABNORMAL HIGH (ref 8–23)
CO2: 26 mmol/L (ref 22–32)
Calcium: 8.8 mg/dL — ABNORMAL LOW (ref 8.9–10.3)
Chloride: 106 mmol/L (ref 98–111)
Creatinine, Ser: 2.24 mg/dL — ABNORMAL HIGH (ref 0.61–1.24)
GFR, Estimated: 30 mL/min — ABNORMAL LOW (ref >60)
Glucose, Bld: 102 mg/dL — ABNORMAL HIGH (ref 70–99)
Potassium: 4.7 mmol/L (ref 3.5–5.1)
Sodium: 140 mmol/L (ref 135–145)
Total Bilirubin: 1 mg/dL (ref 0.3–1.2)
Total Protein: 7.3 g/dL (ref 6.5–8.1)

## 2021-11-18 LAB — URINALYSIS, ROUTINE W REFLEX MICROSCOPIC
Bilirubin Urine: NEGATIVE
Glucose, UA: 150 mg/dL — AB
Hgb urine dipstick: NEGATIVE
Ketones, ur: NEGATIVE mg/dL
Leukocytes,Ua: NEGATIVE
Nitrite: NEGATIVE
Protein, ur: NEGATIVE mg/dL
Specific Gravity, Urine: 1.011 (ref 1.005–1.030)
pH: 5 (ref 5.0–8.0)

## 2021-11-18 LAB — CBC
HCT: 49.8 % (ref 39.0–52.0)
Hemoglobin: 16.5 g/dL (ref 13.0–17.0)
MCH: 30.7 pg (ref 26.0–34.0)
MCHC: 33.1 g/dL (ref 30.0–36.0)
MCV: 92.6 fL (ref 80.0–100.0)
Platelets: 322 10*3/uL (ref 150–400)
RBC: 5.38 MIL/uL (ref 4.22–5.81)
RDW: 18.3 % — ABNORMAL HIGH (ref 11.5–15.5)
WBC: 10.1 10*3/uL (ref 4.0–10.5)
nRBC: 0 % (ref 0.0–0.2)

## 2021-11-18 LAB — SARS CORONAVIRUS 2 (TAT 6-24 HRS): SARS Coronavirus 2: NEGATIVE

## 2021-11-18 LAB — PROTIME-INR
INR: 1.8 — ABNORMAL HIGH (ref 0.8–1.2)
Prothrombin Time: 20.3 seconds — ABNORMAL HIGH (ref 11.4–15.2)

## 2021-11-18 LAB — BRAIN NATRIURETIC PEPTIDE: B Natriuretic Peptide: 398.2 pg/mL — ABNORMAL HIGH (ref 0.0–100.0)

## 2021-11-18 LAB — SURGICAL PCR SCREEN
MRSA, PCR: POSITIVE — AB
Staphylococcus aureus: POSITIVE — AB

## 2021-11-18 NOTE — Progress Notes (Signed)
PCP - Marianna Payment, MD ?Cardiologist - Crissie Sickles, MD ? ?PPM/ICD - denies ?Device Orders - n/a ?Rep Notified - n/a ?Loop Recorder - yes ? ?Chest x-ray - 11/18/2021 ?EKG - 11/18/2021 ?Stress Test -  ?ECHO - 11/07/2021 ?Cardiac Cath -  ? ?Sleep Study - denies ?CPAP - n/a ? ?Fasting Blood Sugar - n/a ? ?Blood Thinner Instructions: Eliquis - last dose on 11/17/2021 ? ?Aspirin Instructions: Patient was instructed: As of today, STOP taking any Aspirin (unless otherwise instructed by your surgeon) Aleve, Naproxen, Ibuprofen, Motrin, Advil, Goody's, BC's, all herbal medications, fish oil, and all vitamins. ?  ?ERAS Protcol - n/a ? ?COVID TEST- done in PAT on 11/18/2021 ? ? ?Anesthesia review: yes - cardiac history ? ?Patient denies shortness of breath, fever, cough and chest pain at PAT appointment ? ? ?All instructions explained to the patient, with a verbal understanding of the material. Patient agrees to go over the instructions while at home for a better understanding. Patient also instructed to self quarantine after being tested for COVID-19. The opportunity to ask questions was provided. ?  ?

## 2021-11-18 NOTE — Progress Notes (Addendum)
Abnormal labs in PAT: BNP 398.2; Creatinine 2.24; PT 20.3; INR 1.8; Urinalysis: Glucose, UA 150; MRSA PCR and Staph - positive.Dr. Ali Lowe office was notified Curt Bears, Lynelle Smoke, RN) ?

## 2021-11-19 MED ORDER — CEFAZOLIN SODIUM-DEXTROSE 2-4 GM/100ML-% IV SOLN
2.0000 g | INTRAVENOUS | Status: AC
  Administered 2021-11-20: 2 g via INTRAVENOUS
  Filled 2021-11-19 (×3): qty 100

## 2021-11-19 MED ORDER — TRANEXAMIC ACID (OHS) BOLUS VIA INFUSION
15.0000 mg/kg | INTRAVENOUS | Status: DC
  Filled 2021-11-19 (×2): qty 1356

## 2021-11-19 MED ORDER — TRANEXAMIC ACID (OHS) PUMP PRIME SOLUTION
2.0000 mg/kg | INTRAVENOUS | Status: DC
  Filled 2021-11-19 (×2): qty 1.81

## 2021-11-19 MED ORDER — HEPARIN 30,000 UNITS/1000 ML (OHS) CELLSAVER SOLUTION
Status: DC
  Filled 2021-11-19 (×2): qty 1000

## 2021-11-19 MED ORDER — TRANEXAMIC ACID 1000 MG/10ML IV SOLN
1.5000 mg/kg/h | INTRAVENOUS | Status: DC
  Filled 2021-11-19 (×2): qty 25

## 2021-11-19 MED ORDER — MAGNESIUM SULFATE 50 % IJ SOLN
40.0000 meq | INTRAMUSCULAR | Status: DC
  Filled 2021-11-19 (×2): qty 9.85

## 2021-11-19 MED ORDER — VANCOMYCIN HCL 1500 MG/300ML IV SOLN
1500.0000 mg | INTRAVENOUS | Status: AC
  Administered 2021-11-20: 1500 mg via INTRAVENOUS
  Filled 2021-11-19 (×2): qty 300

## 2021-11-19 MED ORDER — EPINEPHRINE HCL 5 MG/250ML IV SOLN IN NS
0.0000 ug/min | INTRAVENOUS | Status: DC
  Filled 2021-11-19 (×2): qty 250

## 2021-11-19 MED ORDER — DEXMEDETOMIDINE HCL IN NACL 400 MCG/100ML IV SOLN
0.1000 ug/kg/h | INTRAVENOUS | Status: DC
  Filled 2021-11-19 (×2): qty 100

## 2021-11-19 MED ORDER — PHENYLEPHRINE HCL-NACL 20-0.9 MG/250ML-% IV SOLN
30.0000 ug/min | INTRAVENOUS | Status: DC
  Filled 2021-11-19 (×2): qty 250

## 2021-11-19 MED ORDER — NITROGLYCERIN IN D5W 200-5 MCG/ML-% IV SOLN
2.0000 ug/min | INTRAVENOUS | Status: DC
  Filled 2021-11-19: qty 250

## 2021-11-19 MED ORDER — NOREPINEPHRINE 4 MG/250ML-% IV SOLN
0.0000 ug/min | INTRAVENOUS | Status: DC
  Filled 2021-11-19: qty 250

## 2021-11-19 MED ORDER — POTASSIUM CHLORIDE 2 MEQ/ML IV SOLN
80.0000 meq | INTRAVENOUS | Status: DC
  Filled 2021-11-19 (×2): qty 40

## 2021-11-19 MED ORDER — INSULIN REGULAR(HUMAN) IN NACL 100-0.9 UT/100ML-% IV SOLN
INTRAVENOUS | Status: DC
Start: 2021-11-20 — End: 2021-11-20
  Filled 2021-11-19 (×2): qty 100

## 2021-11-19 MED ORDER — MILRINONE LACTATE IN DEXTROSE 20-5 MG/100ML-% IV SOLN
0.3000 ug/kg/min | INTRAVENOUS | Status: DC
  Filled 2021-11-19 (×2): qty 100

## 2021-11-19 MED ORDER — PLASMA-LYTE A IV SOLN
INTRAVENOUS | Status: DC
  Filled 2021-11-19 (×2): qty 2.5

## 2021-11-19 MED ORDER — CEFAZOLIN SODIUM-DEXTROSE 2-4 GM/100ML-% IV SOLN
2.0000 g | INTRAVENOUS | Status: DC
  Filled 2021-11-19 (×2): qty 100

## 2021-11-20 ENCOUNTER — Inpatient Hospital Stay (HOSPITAL_COMMUNITY): Payer: Medicare Other | Admitting: Certified Registered Nurse Anesthetist

## 2021-11-20 ENCOUNTER — Other Ambulatory Visit: Payer: Self-pay

## 2021-11-20 ENCOUNTER — Other Ambulatory Visit: Payer: Self-pay | Admitting: Cardiology

## 2021-11-20 ENCOUNTER — Inpatient Hospital Stay (HOSPITAL_COMMUNITY): Payer: Medicare Other | Admitting: Physician Assistant

## 2021-11-20 ENCOUNTER — Encounter (HOSPITAL_COMMUNITY): Payer: Self-pay | Admitting: Internal Medicine

## 2021-11-20 ENCOUNTER — Inpatient Hospital Stay (HOSPITAL_COMMUNITY)
Admission: RE | Admit: 2021-11-20 | Discharge: 2021-11-21 | DRG: 266 | Disposition: A | Discharge status: Home / Self Care | Payer: Medicare Other | Attending: Internal Medicine | Admitting: Internal Medicine

## 2021-11-20 ENCOUNTER — Telehealth (HOSPITAL_COMMUNITY): Payer: Self-pay

## 2021-11-20 ENCOUNTER — Encounter (HOSPITAL_COMMUNITY)
Admission: RE | Disposition: A | Discharge status: Home / Self Care | Payer: Self-pay | Source: Home / Self Care | Attending: Internal Medicine

## 2021-11-20 ENCOUNTER — Inpatient Hospital Stay (HOSPITAL_COMMUNITY): Payer: Medicare Other

## 2021-11-20 DIAGNOSIS — I5040 Unspecified combined systolic (congestive) and diastolic (congestive) heart failure: Secondary | ICD-10-CM | POA: Diagnosis present

## 2021-11-20 DIAGNOSIS — Z7902 Long term (current) use of antithrombotics/antiplatelets: Secondary | ICD-10-CM

## 2021-11-20 DIAGNOSIS — I509 Heart failure, unspecified: Secondary | ICD-10-CM | POA: Diagnosis not present

## 2021-11-20 DIAGNOSIS — I2510 Atherosclerotic heart disease of native coronary artery without angina pectoris: Secondary | ICD-10-CM | POA: Diagnosis present

## 2021-11-20 DIAGNOSIS — N1832 Chronic kidney disease, stage 3b: Secondary | ICD-10-CM | POA: Diagnosis present

## 2021-11-20 DIAGNOSIS — Z006 Encounter for examination for normal comparison and control in clinical research program: Secondary | ICD-10-CM | POA: Diagnosis not present

## 2021-11-20 DIAGNOSIS — F1120 Opioid dependence, uncomplicated: Secondary | ICD-10-CM | POA: Diagnosis present

## 2021-11-20 DIAGNOSIS — M109 Gout, unspecified: Secondary | ICD-10-CM | POA: Diagnosis present

## 2021-11-20 DIAGNOSIS — R54 Age-related physical debility: Secondary | ICD-10-CM | POA: Diagnosis present

## 2021-11-20 DIAGNOSIS — I428 Other cardiomyopathies: Secondary | ICD-10-CM | POA: Diagnosis present

## 2021-11-20 DIAGNOSIS — I252 Old myocardial infarction: Secondary | ICD-10-CM | POA: Diagnosis not present

## 2021-11-20 DIAGNOSIS — I110 Hypertensive heart disease with heart failure: Secondary | ICD-10-CM | POA: Diagnosis not present

## 2021-11-20 DIAGNOSIS — Z7901 Long term (current) use of anticoagulants: Secondary | ICD-10-CM | POA: Diagnosis not present

## 2021-11-20 DIAGNOSIS — K219 Gastro-esophageal reflux disease without esophagitis: Secondary | ICD-10-CM | POA: Diagnosis present

## 2021-11-20 DIAGNOSIS — Z20822 Contact with and (suspected) exposure to covid-19: Secondary | ICD-10-CM | POA: Diagnosis present

## 2021-11-20 DIAGNOSIS — Z8701 Personal history of pneumonia (recurrent): Secondary | ICD-10-CM

## 2021-11-20 DIAGNOSIS — Z955 Presence of coronary angioplasty implant and graft: Secondary | ICD-10-CM | POA: Diagnosis not present

## 2021-11-20 DIAGNOSIS — Z952 Presence of prosthetic heart valve: Secondary | ICD-10-CM | POA: Diagnosis not present

## 2021-11-20 DIAGNOSIS — J449 Chronic obstructive pulmonary disease, unspecified: Secondary | ICD-10-CM | POA: Diagnosis present

## 2021-11-20 DIAGNOSIS — Z9861 Coronary angioplasty status: Secondary | ICD-10-CM

## 2021-11-20 DIAGNOSIS — I340 Nonrheumatic mitral (valve) insufficiency: Secondary | ICD-10-CM

## 2021-11-20 DIAGNOSIS — Z8249 Family history of ischemic heart disease and other diseases of the circulatory system: Secondary | ICD-10-CM

## 2021-11-20 DIAGNOSIS — I739 Peripheral vascular disease, unspecified: Secondary | ICD-10-CM | POA: Diagnosis present

## 2021-11-20 DIAGNOSIS — I083 Combined rheumatic disorders of mitral, aortic and tricuspid valves: Secondary | ICD-10-CM | POA: Diagnosis not present

## 2021-11-20 DIAGNOSIS — Z79899 Other long term (current) drug therapy: Secondary | ICD-10-CM | POA: Diagnosis not present

## 2021-11-20 DIAGNOSIS — I130 Hypertensive heart and chronic kidney disease with heart failure and stage 1 through stage 4 chronic kidney disease, or unspecified chronic kidney disease: Secondary | ICD-10-CM | POA: Diagnosis present

## 2021-11-20 DIAGNOSIS — I4891 Unspecified atrial fibrillation: Secondary | ICD-10-CM | POA: Diagnosis present

## 2021-11-20 DIAGNOSIS — F17210 Nicotine dependence, cigarettes, uncomplicated: Secondary | ICD-10-CM | POA: Diagnosis present

## 2021-11-20 DIAGNOSIS — E785 Hyperlipidemia, unspecified: Secondary | ICD-10-CM | POA: Diagnosis present

## 2021-11-20 DIAGNOSIS — Z7951 Long term (current) use of inhaled steroids: Secondary | ICD-10-CM | POA: Diagnosis not present

## 2021-11-20 DIAGNOSIS — I082 Rheumatic disorders of both aortic and tricuspid valves: Secondary | ICD-10-CM | POA: Diagnosis present

## 2021-11-20 DIAGNOSIS — I5043 Acute on chronic combined systolic (congestive) and diastolic (congestive) heart failure: Secondary | ICD-10-CM | POA: Diagnosis present

## 2021-11-20 DIAGNOSIS — I4819 Other persistent atrial fibrillation: Secondary | ICD-10-CM | POA: Diagnosis present

## 2021-11-20 DIAGNOSIS — I5041 Acute combined systolic (congestive) and diastolic (congestive) heart failure: Secondary | ICD-10-CM

## 2021-11-20 DIAGNOSIS — Z9889 Other specified postprocedural states: Principal | ICD-10-CM

## 2021-11-20 DIAGNOSIS — I10 Essential (primary) hypertension: Secondary | ICD-10-CM | POA: Diagnosis present

## 2021-11-20 DIAGNOSIS — Z95818 Presence of other cardiac implants and grafts: Secondary | ICD-10-CM

## 2021-11-20 HISTORY — DX: Cardiomyopathy, unspecified: I42.9

## 2021-11-20 HISTORY — PX: MITRAL VALVE REPAIR: CATH118311

## 2021-11-20 HISTORY — PX: TEE WITHOUT CARDIOVERSION: SHX5443

## 2021-11-20 HISTORY — DX: Nonrheumatic mitral (valve) insufficiency: I34.0

## 2021-11-20 HISTORY — DX: Other specified postprocedural states: Z98.890

## 2021-11-20 LAB — PREPARE RBC (CROSSMATCH)

## 2021-11-20 LAB — ECHO TEE
MV M vel: 4.45 m/s
MV Peak grad: 79.2 mmHg
P 1/2 time: 507 msec
Radius: 0.6 cm

## 2021-11-20 LAB — ABO/RH: ABO/RH(D): O POS

## 2021-11-20 SURGERY — MITRAL VALVE REPAIR
Anesthesia: General

## 2021-11-20 MED ORDER — CHLORHEXIDINE GLUCONATE 4 % EX LIQD: 60.0000 mL | Freq: Once | CUTANEOUS | Status: DC

## 2021-11-20 MED ORDER — HEPARIN SODIUM (PORCINE) 1000 UNIT/ML IJ SOLN
INTRAMUSCULAR | Status: DC | PRN
  Administered 2021-11-20: 2000 [IU] via INTRAVENOUS
  Administered 2021-11-20: 10000 [IU] via INTRAVENOUS

## 2021-11-20 MED ORDER — HEPARIN (PORCINE) IN NACL 2000-0.9 UNIT/L-% IV SOLN
INTRAVENOUS | Status: DC | PRN
  Administered 2021-11-20 (×3): 1000 mL

## 2021-11-20 MED ORDER — SODIUM CHLORIDE 0.9% FLUSH
3.0000 mL | Freq: Two times a day (BID) | INTRAVENOUS | Status: DC
  Administered 2021-11-20 – 2021-11-21 (×3): 3 mL via INTRAVENOUS

## 2021-11-20 MED ORDER — APIXABAN 5 MG PO TABS
5.0000 mg | ORAL_TABLET | Freq: Two times a day (BID) | ORAL | Status: DC
  Administered 2021-11-20 – 2021-11-21 (×2): 5 mg via ORAL
  Filled 2021-11-20 (×2): qty 1

## 2021-11-20 MED ORDER — SODIUM CHLORIDE 0.9 % IV SOLN: INTRAVENOUS | Status: DC

## 2021-11-20 MED ORDER — ROCURONIUM BROMIDE 10 MG/ML (PF) SYRINGE
PREFILLED_SYRINGE | INTRAVENOUS | Status: DC | PRN
  Administered 2021-11-20: 20 mg via INTRAVENOUS
  Administered 2021-11-20: 60 mg via INTRAVENOUS

## 2021-11-20 MED ORDER — ACETAMINOPHEN 325 MG PO TABS: 650.0000 mg | ORAL_TABLET | ORAL | Status: DC | PRN

## 2021-11-20 MED ORDER — FENTANYL CITRATE (PF) 100 MCG/2ML IJ SOLN
INTRAMUSCULAR | Status: DC | PRN
  Administered 2021-11-20: 100 ug via INTRAVENOUS

## 2021-11-20 MED ORDER — CHLORHEXIDINE GLUCONATE 0.12 % MT SOLN
15.0000 mL | Freq: Once | OROMUCOSAL | Status: AC
  Administered 2021-11-20: 15 mL via OROMUCOSAL
  Filled 2021-11-20: qty 15

## 2021-11-20 MED ORDER — HEPARIN (PORCINE) IN NACL 2000-0.9 UNIT/L-% IV SOLN
INTRAVENOUS | Status: AC
  Filled 2021-11-20: qty 3000

## 2021-11-20 MED ORDER — FLUTICASONE FUROATE-VILANTEROL 200-25 MCG/ACT IN AEPB
1.0000 | INHALATION_SPRAY | Freq: Every day | RESPIRATORY_TRACT | Status: DC
  Administered 2021-11-21: 1 via RESPIRATORY_TRACT
  Filled 2021-11-20: qty 28

## 2021-11-20 MED ORDER — LACTATED RINGERS IV SOLN: INTRAVENOUS | Status: DC | PRN

## 2021-11-20 MED ORDER — LIDOCAINE 2% (20 MG/ML) 5 ML SYRINGE
INTRAMUSCULAR | Status: DC | PRN
  Administered 2021-11-20: 80 mg via INTRAVENOUS

## 2021-11-20 MED ORDER — ACETAMINOPHEN 500 MG PO TABS
1000.0000 mg | ORAL_TABLET | Freq: Once | ORAL | Status: AC
  Administered 2021-11-20: 1000 mg via ORAL
  Filled 2021-11-20: qty 2

## 2021-11-20 MED ORDER — HYDRALAZINE HCL 20 MG/ML IJ SOLN
INTRAMUSCULAR | Status: DC | PRN
  Administered 2021-11-20: 5 mg via INTRAVENOUS

## 2021-11-20 MED ORDER — HYDRALAZINE HCL 20 MG/ML IJ SOLN: 5.0000 mg | INTRAMUSCULAR | Status: DC | PRN

## 2021-11-20 MED ORDER — DEXAMETHASONE SODIUM PHOSPHATE 10 MG/ML IJ SOLN
INTRAMUSCULAR | Status: DC | PRN
  Administered 2021-11-20: 5 mg via INTRAVENOUS

## 2021-11-20 MED ORDER — FUROSEMIDE 10 MG/ML IJ SOLN
20.0000 mg | Freq: Once | INTRAMUSCULAR | Status: AC
  Administered 2021-11-20: 20 mg via INTRAVENOUS
  Filled 2021-11-20: qty 2

## 2021-11-20 MED ORDER — SUGAMMADEX SODIUM 200 MG/2ML IV SOLN
INTRAVENOUS | Status: DC | PRN
  Administered 2021-11-20: 50 mg via INTRAVENOUS
  Administered 2021-11-20: 100 mg via INTRAVENOUS
  Administered 2021-11-20: 50 mg via INTRAVENOUS

## 2021-11-20 MED ORDER — CHLORHEXIDINE GLUCONATE 4 % EX LIQD: 30.0000 mL | CUTANEOUS | Status: DC

## 2021-11-20 MED ORDER — PHENYLEPHRINE HCL-NACL 20-0.9 MG/250ML-% IV SOLN
INTRAVENOUS | Status: DC | PRN
  Administered 2021-11-20: 35 ug/min via INTRAVENOUS

## 2021-11-20 MED ORDER — CEFAZOLIN SODIUM-DEXTROSE 2-4 GM/100ML-% IV SOLN: 2.0000 g | INTRAVENOUS | Status: DC

## 2021-11-20 MED ORDER — ALBUTEROL SULFATE (2.5 MG/3ML) 0.083% IN NEBU: 3.0000 mL | INHALATION_SOLUTION | Freq: Four times a day (QID) | RESPIRATORY_TRACT | Status: DC | PRN

## 2021-11-20 MED ORDER — BUPRENORPHINE HCL-NALOXONE HCL 8-2 MG SL SUBL
1.0000 | SUBLINGUAL_TABLET | Freq: Every day | SUBLINGUAL | Status: DC
  Administered 2021-11-20 – 2021-11-21 (×2): 1 via SUBLINGUAL
  Filled 2021-11-20 (×2): qty 1

## 2021-11-20 MED ORDER — METOPROLOL SUCCINATE ER 25 MG PO TB24
25.0000 mg | ORAL_TABLET | Freq: Every day | ORAL | Status: DC
  Administered 2021-11-20: 25 mg via ORAL
  Filled 2021-11-20 (×2): qty 1

## 2021-11-20 MED ORDER — SPIRONOLACTONE 12.5 MG HALF TABLET
12.5000 mg | ORAL_TABLET | Freq: Every day | ORAL | Status: DC
  Administered 2021-11-20 – 2021-11-21 (×2): 12.5 mg via ORAL
  Filled 2021-11-20 (×2): qty 1

## 2021-11-20 MED ORDER — SODIUM CHLORIDE 0.9% IV SOLUTION: Freq: Once | INTRAVENOUS | Status: DC

## 2021-11-20 MED ORDER — ATORVASTATIN CALCIUM 80 MG PO TABS
80.0000 mg | ORAL_TABLET | Freq: Every day | ORAL | Status: DC
Start: 2021-11-20 — End: 2021-11-21
  Administered 2021-11-20 – 2021-11-21 (×2): 80 mg via ORAL
  Filled 2021-11-20 (×2): qty 1

## 2021-11-20 MED ORDER — ONDANSETRON HCL 4 MG/2ML IJ SOLN
INTRAMUSCULAR | Status: DC | PRN
  Administered 2021-11-20: 4 mg via INTRAVENOUS

## 2021-11-20 MED ORDER — LABETALOL HCL 5 MG/ML IV SOLN: 10.0000 mg | INTRAVENOUS | Status: DC | PRN

## 2021-11-20 MED ORDER — FUROSEMIDE 10 MG/ML IJ SOLN
INTRAMUSCULAR | Status: AC
  Filled 2021-11-20: qty 4

## 2021-11-20 MED ORDER — SODIUM CHLORIDE 0.9 % IV SOLN: 250.0000 mL | INTRAVENOUS | Status: DC | PRN

## 2021-11-20 MED ORDER — TORSEMIDE 20 MG PO TABS
60.0000 mg | ORAL_TABLET | Freq: Two times a day (BID) | ORAL | Status: DC
  Administered 2021-11-20 – 2021-11-21 (×2): 60 mg via ORAL
  Filled 2021-11-20 (×2): qty 3

## 2021-11-20 MED ORDER — ESMOLOL HCL 100 MG/10ML IV SOLN
INTRAVENOUS | Status: DC | PRN
  Administered 2021-11-20: 30 mg via INTRAVENOUS

## 2021-11-20 MED ORDER — SODIUM CHLORIDE 0.9% FLUSH: 3.0000 mL | INTRAVENOUS | Status: DC | PRN

## 2021-11-20 MED ORDER — FUROSEMIDE 10 MG/ML IJ SOLN
INTRAMUSCULAR | Status: DC | PRN
  Administered 2021-11-20: 40 mg via INTRAMUSCULAR

## 2021-11-20 MED ORDER — PHENYLEPHRINE 80 MCG/ML (10ML) SYRINGE FOR IV PUSH (FOR BLOOD PRESSURE SUPPORT)
PREFILLED_SYRINGE | INTRAVENOUS | Status: DC | PRN
  Administered 2021-11-20 (×3): 80 ug via INTRAVENOUS

## 2021-11-20 MED ORDER — POTASSIUM CHLORIDE CRYS ER 20 MEQ PO TBCR
20.0000 meq | EXTENDED_RELEASE_TABLET | Freq: Two times a day (BID) | ORAL | Status: DC
  Administered 2021-11-20 – 2021-11-21 (×3): 20 meq via ORAL
  Filled 2021-11-20 (×3): qty 1

## 2021-11-20 MED ORDER — PROTAMINE SULFATE 10 MG/ML IV SOLN
INTRAVENOUS | Status: DC | PRN
  Administered 2021-11-20 (×2): 30 mg via INTRAVENOUS

## 2021-11-20 MED ORDER — DAPAGLIFLOZIN PROPANEDIOL 10 MG PO TABS
10.0000 mg | ORAL_TABLET | Freq: Every day | ORAL | Status: DC
  Administered 2021-11-21: 10 mg via ORAL
  Filled 2021-11-20: qty 1

## 2021-11-20 MED ORDER — HEPARIN (PORCINE) IN NACL 1000-0.9 UT/500ML-% IV SOLN
INTRAVENOUS | Status: AC
  Filled 2021-11-20: qty 500

## 2021-11-20 MED ORDER — PROPOFOL 10 MG/ML IV BOLUS
INTRAVENOUS | Status: DC | PRN
  Administered 2021-11-20: 30 mg via INTRAVENOUS
  Administered 2021-11-20: 20 mg via INTRAVENOUS
  Administered 2021-11-20: 50 mg via INTRAVENOUS

## 2021-11-20 MED ORDER — AMIODARONE HCL 200 MG PO TABS
200.0000 mg | ORAL_TABLET | Freq: Every day | ORAL | Status: DC
  Administered 2021-11-20 – 2021-11-21 (×2): 200 mg via ORAL
  Filled 2021-11-20 (×2): qty 1

## 2021-11-20 SURGICAL SUPPLY — 18 items
BLANKET WARM UNDERBOD FULL ACC (MISCELLANEOUS) ×1 IMPLANT
CATH MITRA STEERABLE GUIDE (CATHETERS) ×1 IMPLANT
CLIP MITRA G4 DELIVERY SYS XTW (Clip) ×1 IMPLANT
CLOSURE PERCLOSE PROSTYLE (VASCULAR PRODUCTS) ×2 IMPLANT
ELECT DEFIB PAD ADLT CADENCE (PAD) ×1 IMPLANT
HEMOSTAT KELLY 5.5 SS STRL (MISCELLANEOUS) ×1 IMPLANT
KIT DILATOR VASC 18G NDL (KITS) ×1 IMPLANT
KIT HEART LEFT (KITS) ×4 IMPLANT
KIT VERSACROSS LRG ACCESS (CATHETERS) ×1 IMPLANT
PACK CARDIAC CATHETERIZATION (CUSTOM PROCEDURE TRAY) ×2 IMPLANT
SHEATH PINNACLE 8F 10CM (SHEATH) ×1 IMPLANT
SHEATH PROBE COVER 6X72 (BAG) ×2 IMPLANT
STOPCOCK MORSE 400PSI 3WAY (MISCELLANEOUS) ×12 IMPLANT
SYSTEM MITRACLIP G4 (SYSTAGENIX WOUND MANAGEMENT) ×1 IMPLANT
TRANSDUCER W/STOPCOCK (MISCELLANEOUS) ×2 IMPLANT
TUBING ART PRESS 72  MALE/FEM (TUBING) ×2
TUBING ART PRESS 72 MALE/FEM (TUBING) ×1 IMPLANT
WIRE MICRO SET SILHO 5FR 7 (SHEATH) ×1 IMPLANT

## 2021-11-20 NOTE — Anesthesia Preprocedure Evaluation (Signed)
Anesthesia Evaluation  ?Patient identified by MRN, date of birth, ID band ?Patient awake ? ? ? ?Reviewed: ?Allergy & Precautions, NPO status , Patient's Chart, lab work & pertinent test results ? ?Airway ?Mallampati: II ? ?TM Distance: >3 FB ?Neck ROM: Full ? ? ? Dental ? ?(+) Dental Advisory Given ?  ?Pulmonary ?COPD,  COPD inhaler, Current Smoker,  ?  ?breath sounds clear to auscultation ? ? ? ? ? ? Cardiovascular ?hypertension, Pt. on medications and Pt. on home beta blockers ?+ Past MI, + Peripheral Vascular Disease and +CHF  ?+ dysrhythmias Atrial Fibrillation + Valvular Problems/Murmurs MR  ?Rhythm:Regular Rate:Normal ? ?EF 40-45%. Severe MR. Mod severe TR. Ascending aortic aneurysm 5.1cm. AI is mod/ severe. ?  ?Neuro/Psych ?negative neurological ROS ?   ? GI/Hepatic ?Neg liver ROS, GERD  ,  ?Endo/Other  ?negative endocrine ROS ? Renal/GU ?Renal InsufficiencyRenal disease  ? ?  ?Musculoskeletal ? ? Abdominal ?  ?Peds ? Hematology ?negative hematology ROS ?(+)   ?Anesthesia Other Findings ? ? Reproductive/Obstetrics ? ?  ? ? ? ? ? ? ? ? ? ? ? ? ? ?  ?  ? ? ? ? ? ? ? ? ?Lab Results  ?Component Value Date  ? WBC 10.1 11/18/2021  ? HGB 16.5 11/18/2021  ? HCT 49.8 11/18/2021  ? MCV 92.6 11/18/2021  ? PLT 322 11/18/2021  ? ?Lab Results  ?Component Value Date  ? CREATININE 2.24 (H) 11/18/2021  ? BUN 56 (H) 11/18/2021  ? NA 140 11/18/2021  ? K 4.7 11/18/2021  ? CL 106 11/18/2021  ? CO2 26 11/18/2021  ? ? ?Anesthesia Physical ?Anesthesia Plan ? ?ASA: 4 ? ?Anesthesia Plan: General  ? ?Post-op Pain Management: Tylenol PO (pre-op)* and Minimal or no pain anticipated  ? ?Induction: Intravenous ? ?PONV Risk Score and Plan: 1 and Dexamethasone, Ondansetron and Treatment may vary due to age or medical condition ? ?Airway Management Planned: Oral ETT ? ?Additional Equipment: Arterial line ? ?Intra-op Plan:  ? ?Post-operative Plan: Extubation in OR ? ?Informed Consent: I have reviewed the  patients History and Physical, chart, labs and discussed the procedure including the risks, benefits and alternatives for the proposed anesthesia with the patient or authorized representative who has indicated his/her understanding and acceptance.  ? ? ? ?Dental advisory given ? ?Plan Discussed with: CRNA ? ?Anesthesia Plan Comments:   ? ? ? ? ? ? ?Anesthesia Quick Evaluation ? ?

## 2021-11-20 NOTE — Progress Notes (Signed)
Dr. Ali Lowe made aware that patient did not stop Eliquis on 5/1 as stated per  pre-op instructions. Dr. Ali Lowe said "It's ok".  ?

## 2021-11-20 NOTE — Transfer of Care (Signed)
Immediate Anesthesia Transfer of Care Note ? ?Patient: [name] ? ?Procedure(s) Performed: MITRAL VALVE REPAIR ?TRANSESOPHAGEAL ECHOCARDIOGRAM (TEE) ? ?Patient Location: Cath Lab ? ?Anesthesia Type:General ? ?Level of Consciousness: awake and patient cooperative ? ?Airway & Oxygen Therapy: Patient Spontanous Breathing and Patient connected to nasal cannula oxygen ? ?Post-op Assessment: Report given to RN and Post -op Vital signs reviewed and stable ? ?Post vital signs: Reviewed and stable ? ?Last Vitals:  ?Vitals Value Taken Time  ?BP 105/27 11/20/21 1001  ?Temp 36.6 ?C 11/20/21 0956  ?Pulse 56 11/20/21 1002  ?Resp 15 11/20/21 1002  ?SpO2 96 % 11/20/21 1002  ?Vitals shown include unvalidated device data. ? ?Last Pain:  ?Vitals:  ? 11/20/21 0956  ?TempSrc: Temporal  ?PainSc: Asleep  ?   ? ?  ? ?Complications: There were no known notable events for this encounter. ?

## 2021-11-20 NOTE — Op Note (Signed)
?[facility]   ?MULTIDISCIPLINARY HEART TEAM ? ?Date of Procedure:  11/20/2021 ? ?Preoperative Diagnosis: Severe Symptomatic Mitral Regurgitation (Stage D) ? ?Postoperative Diagnosis: Same  ? ?Procedure Performed: ?Ultrasound-guided right transfemoral venous access ?Double PreClose right femoral vein ?Transseptal puncture using Bailess RF needle ?Mitral valve repair with MitraClip XTW ? ?Surgeon: Lenna Sciara, MD ?Co-Surgeon: Blane Ohara, MD  ? ?Echocardiographer: Rudean Haskell, MD ? ?Anesthesiologist: Rodman Comp, MD ? ?Device Implant: Mitraclip XTW, deplyed A2/P2 ? ?Procedural Indication: ?Severe Mitral Regurgitation (Stage D)  ? ?Brief History: ?76 year old male with history of nonischemic cardiomyopathy, coronary artery disease, COPD, stage III chronic kidney disease, demonstrated to suffer from severe symptomatic mitral regurgitation.  The patient underwent multidisciplinary heart valve team review including formal evaluation by advanced heart failure (Dr. Aundra Dubin).  After review of his case, he is felt to have appropriate mitral valve functional anatomy and clinical characteristics for treatment with transcatheter edge-to-edge repair of the mitral valve. ? ?Echo Findings: ?Preop:  ?Moderate LV systolic dysfunction ?Severe MR secondary to annular dilatation ?Post-op:  ?Unchanged LV systolic function ?Mild 1+ residual MR ? ?Procedural Details: ?Prep ?The patient is brought to the cardiac catheterization lab in the fasting state. General anesthesia is induced. The patient is prepped from the groin to chin. Hemodynamics are monitored via a radial artery line.  ? ?Venous Access ?Using ultrasound guidance, the right femoral vein is punctured. Ultrasound images are captured and stored in the patient's chart. The vein is dilated and 2 Perclose devices are deplyed at 10' and 2' positions to 'Preclose' the femoral vein. An 8 Fr sheath is inserted. ? ?Transseptal Puncture ?A Baylis Versacross  wire is advanced into the SVC ?A Baylis transseptal dilator is advanced into the SVC, and the VersaCross RF wire is retracted into the dilator  ?The transseptal sheath is retracted into the RA under fluoroscopic and echo guidance to obtain position on the posterior fossa where echo measurements are made to assure appropriate access to the mitral valve. Once proper position is confirmed by echo, RF energy is delivered and the VersaCross wire is advanced into the LA without resistance. The dilator is advanced over the wire where proper position is confirmed by echo and pressure measurement ?Weight based IV heparin is administered and a therapeutic ACT > 250 is confirmed ? ?Steerable Guide Catheter Insertion ?The VersaCross wire is positioned at the left upper pulmonary vein ?The femoral vein is progressively dilated and the 24 Fr Steerable guide catheter is inserted and then directed across the interatrial septum over the wire. Position is confirmed approximately 3 cm into the left atrium ?The guide is de-aired  ? ?MitraClip Insertion ?The MitraClip XTW is prepped per protocol and inserted via the introducer into the steerable guide catheter ?The Clip Delivery System (CDS) is advanced under fluoro and echo guidance so that the sleeve markers are evenly spaced on each side of the guide marker ? ?MitraClip Positioning in the Left Atrium (Supravalvular Alignment) ?M-knob is applied to bring the Clip towards the mitral valve. Echo guidance is used to avoid contact with LA structures. ?The Clip arms are opened to 180 degrees ?2D and 3D TEE imaging is performed in multiple planes and the Clip is positioned and aligned above the valve using standard steering techniques  ? ?Entry into the Left Ventricle and Mitral Valve Leaflet Grasp ?The Clip is advanced across the mitral valve into the LV, maintaining proper orientation ?The Clip arms are opened to 120 degrees and the Clip is slowly  retracted  ?Capture of both the anterior  and posterior leaflets are visualized by echo and the grippers are dropped ? ?MitraClip Deployment ?After initial grasp, there is moderate MR lateral to the Clip and the Clip is released, repositioned more central on the valve, and a second grasp is performed ?After extensive echo evaluation, reduction in mitral regurgitation is felt to be adequate ?Following standard protocol, the lock line is removed after testing the lock mechanism. The lock is rechecked and is shown to be intact. The MitraClip device is deployed and the clip delivery system is removed under echo guidance with caution taken to avoid contact with LA structures ? ?Device Removal ?The clip delivery system is removed under echo guidance ?The steerable guide catheter is retracted into the right atrium and the interatrial septum is assessed by echo without evidence of right-to-left shunting or large ASD ? ?Hemostasis ?The guide catheter is removed over a 0.035" wire and the Perclose sutures are tightened with complete hemostasis and no evidence of hematoma ? ?Estimated blood loss: minimal ? ?There are no immediate procedural complications. The patient is transferred to the post-procedure recovery area in stable condition.  ? ?FINAL CONCLUSION: ?Successful TEER with placement of a MitraClip G4 XTW device positioned A2/P2, reducing MR from 4+ to 1+ without immediate complication ? ?Sherren Mocha ?11/20/2021 ?10:21 AM ? ? ? ? ? ?  ?

## 2021-11-20 NOTE — Progress Notes (Signed)
?  [facility]   ?MULTIDISCIPLINARY HEART VALVE TEAM ? ?Patient doing well s/p TEER. He is hemodynamically stable. Groin site stable. Plan for repeat echocardiogram tomorrow AM. I have consulted [employer] for transportation assistance for anticipated discharge tomorrow early afternoon, 11/21/21.  ? ?Kathyrn Drown NP-C ?Structural Heart Team  ?Pager: [phone] ?Phone: [phone] ? ?

## 2021-11-20 NOTE — Telephone Encounter (Signed)
Called to confirm/remind patient of their appointment at the [facility] on 11/21/21. However, patient is in the hospital currently and appointment has been canceled. ? ? ? ?

## 2021-11-20 NOTE — Discharge Summary (Addendum)
?[facility]   ?MULTIDISCIPLINARY HEART VALVE TEAM ? ?Discharge Summary  ?  ?Patient ID: [name] ?MRN: [id number]; DOB: 10/01/1945 ? ?Admit date: 11/20/2021 ?Discharge date: 11/21/2021 ? ?Primary Care Provider: Marianna Payment, MD  ?Primary Cardiologist: Sinclair Grooms, MD / Dr. Ali Lowe, MD & Dr. Burt Knack, MD (TEER) ? ?Discharge Diagnoses  ?  ?Principal Problem: ?  S/P mitral valve clip implantation ?Active Problems: ?  Atrial fibrillation ([place]) ?  Hypertension ?  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  COPD (chronic obstructive pulmonary disease) ([employer]) ?  Opioid dependence ([place]) ?  Chronic anticoagulation ?  HLD (hyperlipidemia) ?  PAD (peripheral artery disease) ([place]) ?  Severe mitral regurgitation ?  CAD S/P percutaneous coronary angioplasty ?  Acute combined systolic and diastolic heart failure ([place]) ? ?Allergies ?No Known Allergies ? ?Diagnostic Studies/Procedures  ?  ?Transcatheter mitral valve repair 11/20/21: ? ?Procedure Performed: ?Ultrasound-guided right transfemoral venous access ?Double PreClose right femoral vein ?Transseptal puncture using Bailess RF needle ?Mitral valve repair with MitraClip XTW ?  ?Surgeon: Lenna Sciara, MD  ?Co-surgeon: Sherren Mocha, MD ?  ?Echocardiographer: Renee Harder, MD ?  ?Anesthesiologist: Suzette Battiest, MD ?  ?Device Implant: Mitraclip XTW, Serial # [id number] ?  ?Procedural Indication: ?Severe Non-rheumatic Mitral Regurgitation (Stage D)  ? ?Echo Findings: ?Preop:  ?LV systolic dysfunction with EF ~40% ?Severe MR secondary to atrial enlargement and cardimyopathy, Grade 4+ ?NYHA class 2-3 ?Post-op:  ?Unchanged LV systolic function ?1+ residual MR ?Successful mitral transcatheter edge-to-edge repair with 1 XTW clip located at A2 P2 reducing severe mitral regurgitation (4+) to mild mitral regurgitation (1+)vwith normalization of pulmonary vein flow. ?____________ ?  ?Echo 11/21/21: Completed but pending formal  read at the time of discharge  ? ?History of Present Illness   ?  ?[name] is a 76 y.o. male with a history of COPD, CAD/NSTEMI s/p PCI to mid LAD 09/2020, HTN, persistent atrial flutter s/p ablation 2012 with recurrent AF requiring DCCV 09/2021, MDS, HTN, CKDIIIb, ongoing tobacco use disorder with severe COPD, polycythemia vera, chronic pain on suboxone, chronic combined systolic and diastolic CHF with LVEF at 35-40% on most recent echo, and severe mitral regurgitation/tricuspid regurgitation who presented to [facility] on 11/20/21 for planned TAVR.  ? ?[name] has a long history of combined CHF dating back to 2012 with an LVEF found to be 35%, felt to be secondary to polysubstance abuse and atrial flutter. He underwent an atrial flutter ablation in 2012. EF reportedly improved to 55% by 2014, then back down again in 2020 to 35-40%. He struggled to maintain NSR on amiodarone with break through afib after flutter ablation. He has had multiple hospitalizations over the years, mostly in the setting of respiratory failure due to severe COPD. He continues to smoke cigarettes. In 09/2020 he was admitted with chest pain/NSTEMI and dyspnea, found to have single vessel CAD with LAD disease and received PCI to mid LAD. Echo at that time showed moderately reduced LVEF at 40-45% with mod/severe MR and normal RV. He was diuresed and discharged from the hospital on metoprolol, lasix, farxiga, amiodarone, eliquis and DAPT.  ? ?Most recently he was admitted 08/2021 with acute on chronic systolic heart failure. He was back in atrial fibrillation. Repeat echocardiogram with LVEF at 35-40% and normal RV with progression of his mitral regurgitation to severe with an ERO at 0.34cm2, also with moderate AI and severe TR. He was diuresed with symptom improvement. He was then referred to  AHF clinic and was seen by their team 09/09/21 at which time he was fluid overloaded. His medications were adjusted and he was referred to  Structural Heart for further workup of his severe mitral regurgitation.  ? ?He was seen by Dr. Ali Lowe 09/26/21. Plan was to pursue TEE (11/07/21) to evaluate his mitral and tricuspid valves which showed an EF at 40-45%, mean gradient at 68mHg, [place]ak gradient at 6.542mg, MVA 6.31cm2 to 7.78 cm2. MR PISA 0.48cm2, with a broad-based centralized jet, central. This was felt to be an adequate MVA area for TEER with adequate leaflet length.  ? ?He was then evaluated by the multidisciplinary valve team and felt to have severe, symptomatic mitral regurgitation and to be a suitable candidate for TEER, which was set up for 11/20/21.    ? ?Hospital Course  ?  ?Severe mitral regurgitation: s/p successful mitral transcatheter edge-to-edge repair with 1 XTW clip located at A2 P2 reducing severe mitral regurgitation (4+) to mild mitral regurgitation (1+) with normalization of pulmonary vein flow. Echo today performed with no formal ready however on my evaluation, appears to have trace MR with stable clip placement. He has done well with no issues overnight. Groin sites with some ecchymosis but no active bleeding. Post procedure instructions reviewed. SBE discussed and will be RX'ed at follow up next week. Continue home Eliquis, no ASA/Plavix. He has ambulated with CRI without concerns. TOC has been consulted twice for discharge transportation needs.  ?  ?Acute on chronic combined CHF: Felt to be fluid overloaded at case end and treated with IV Lasix '40mg'$  however given his home dose of Torsemide '60mg'$  BID, he was given an additional '20mg'$  IV post procedure. Weight, 199lb today. I&O, net negative 89521mAppears euvolemic on exam. Resume home Torsemide dosing. Will re-check BMET at follow up next week.  ? ?CKD stage IIIb: Cr 2.56 today. Baseline Cr ~1.8-2.3. Will recheck BMET at follow up next week.  ? ?Bradycardia: HR's in the low 50's. Reduce Toprol to 12.'5mg'$  QD. May increase back to '25mg'$  if HR improved on follow up.  ? ?CAD: s/p NSTEMI  with PCI/DES to mLAD 09/2020 ? ?Persistent atrial fibrillation/flutter: s/p AF ablation 2012 with recurrent AF. He has now been treated with amiodarone and is anticoagulated with Eliquis. Maintaining NSR with stable rates. Continue Toprol '25mg'$  QD.  ? ?COPD: Restarted on home inhalers. Stable during hospital course.  ? ?Ongoing tobacco use: Cessation recommended.  ? ?Severe TR/ Severe AI: Follow with surveillance echos  ? ?Consultants: None   ? ?The patient has been seen and examined by Dr. ThuAli Loweo feels that the patient is stable and ready for discharge today, 11/21/21.  ?_____________ ? ?Discharge Vitals ?Blood pressure (!) 117/51, pulse (!) 51, temperature 97.6 ?F (36.4 ?C), temperature source Oral, resp. rate 18, height '5\' 6"'$  (1.676 m), weight 89.8 kg, SpO2 92 %.  ?Filed Weights  ? 11/20/21 0558 11/21/21 0429  ?Weight: 90.3 kg 89.8 kg  ? ?General: Well developed, well nourished, NAD ?Neck: Negative for carotid bruits. No JVD ?Lungs:Clear to ausculation bilaterally. No wheezes. Breathing is unlabored. ?Cardiovascular: RRR with S1 S2. Soft murmur ?Abdomen: Soft, non-tender, non-distended. No obvious abdominal masses. ?Extremities: No edema. ?Neuro: Alert and oriented. No focal deficits. No facial asymmetry. MAE spontaneously. ?Psych: Responds to questions appropriately with normal affect.   ? ?Labs & Radiologic Studies  ?  ?CBC ?Recent Labs  ?  11/18/21 ?1200 11/21/21 ?[id number] 10.1 15.1*  ?HGB 16.5 15.5  ?HCT 49.8 47.1  ?  MCV 92.6 92.4  ?PLT 322 305  ? ?Basic Metabolic Panel ?Recent Labs  ?  11/18/21 ?1200 11/21/21 ?6546  ?NA 140 137  ?K 4.7 5.1  ?CL 106 100  ?CO2 26 27  ?GLUCOSE 102* 116*  ?BUN 56* 59*  ?CREATININE 2.24* 2.56*  ?CALCIUM 8.8* 8.8*  ? ?Liver Function Tests ?Recent Labs  ?  11/18/21 ?1200  ?AST 31  ?ALT 35  ?ALKPHOS 121  ?BILITOT 1.0  ?PROT 7.3  ?ALBUMIN 3.0*  ? ?No results for input(s): LIPASE, AMYLASE in the last 72 hours. ?Cardiac Enzymes ?No results for input(s): CKTOTAL, CKMB, CKMBINDEX,  TROPONINI in the last 72 hours. ?BNP ?Invalid input(s): POCBNP ?D-Dimer ?No results for input(s): DDIMER in the last 72 hours. ?Hemoglobin A1C ?No results for input(s): HGBA1C in the last 72 hours. ?Fasting Lip

## 2021-11-20 NOTE — Anesthesia Procedure Notes (Signed)
Procedure Name: Intubation ?Date/Time: 11/20/2021 7:57 AM ?Performed by: Janene Harvey, CRNA ?Pre-anesthesia Checklist: Patient identified, Emergency Drugs available, Suction available and Patient being monitored ?Patient Re-evaluated:Patient Re-evaluated prior to induction ?Oxygen Delivery Method: Circle system utilized ?Preoxygenation: Pre-oxygenation with 100% oxygen ?Induction Type: IV induction ?Ventilation: Mask ventilation without difficulty and Oral airway inserted - appropriate to patient size ?Laryngoscope Size: Glidescope and 3 ?Grade View: Grade I ?Tube type: Oral ?Tube size: 7.5 mm ?Number of attempts: 1 ?Airway Equipment and Method: Stylet and Oral airway ?Placement Confirmation: ETT inserted through vocal cords under direct vision, positive ETCO2 and breath sounds checked- equal and bilateral ?Secured at: 22 cm ?Tube secured with: Tape ?Dental Injury: Teeth and Oropharynx as per pre-operative assessment  ?Difficulty Due To: Difficulty was anticipated and Difficult Airway- due to reduced neck mobility ?Comments: Elective glidescope. Pt with limited ROM c-spine/pt reports limited ROM ? ? ? ? ?

## 2021-11-20 NOTE — Interval H&P Note (Signed)
History and Physical Interval Note: ? ?11/20/2021 ?6:38 AM ? ?[name]  has presented today for surgery, with the diagnosis of severe mitral insufficiency.  The various methods of treatment have been discussed with the patient and family. After consideration of risks, benefits and other options for treatment, the patient has consented to  Procedure(s): ?MITRAL VALVE REPAIR (N/A) ?TRANSESOPHAGEAL ECHOCARDIOGRAM (TEE) (N/A) as a surgical intervention.  The patient's history has been reviewed, patient examined, no change in status, stable for surgery.  I have reviewed the patient's chart and labs.  Questions were answered to the patient's satisfaction.   ? ? ?[name] ? ? ?

## 2021-11-20 NOTE — Op Note (Signed)
?[facility]ASC[facility]ENTER   ?MULTIDISCIPLINARY HEART TEAM ? ?Date of Procedure:  11/20/2021 ? ?Preoperative Diagnosis: Severe Symptomatic Mitral Regurgitation (Stage D) ? ?Postoperative Diagnosis: Same  ? ?Procedure Performed: ?Ultrasound-guided right transfemoral venous access ?Double PreClose right femoral vein ?Transseptal puncture using Bailess RF needle ?Mitral valve repair with MitraClip XTW ? ?Surgeon: Lenna Sciara, MD  ?Co-surgeon: Sherren Mocha, MD ? ?Echocardiographer: Renee Harder, MD ? ?Anesthesiologist: Suzette Battiest, MD ? ?Device Implant: Mitraclip XTW, Serial # [id number] ? ?Procedural Indication: ?Severe Non-rheumatic Mitral Regurgitation (Stage D)  ? ?Brief History: ?The patient is a 76 year old male with nonischemic cardiomyopathy with ejection fraction of 35 to 40%, coronary artery disease status post PCI to mid LAD in 2022, COPD, atrial flutter status post ablation on apixaban, hypertension, chronic kidney disease stage III, and frailty who was referred by Dr. Aundra Dubin of the heart failure division for recommendations regarding the patient's severe symptomatic mitral regurgitation.  After thorough evaluation he was referred for mitral transcatheter edge-to-edge repair to treat severe symptomatic mitral regurgitation ? ?Echo Findings: ?Preop:  ?LV systolic dysfunction with EF ~40% ?Severe MR secondary to atrial enlargement and cardimyopathy, Grade 4+ ?NYHA class 2-3 ?Post-op:  ?Unchanged LV systolic function ?1+ residual MR ? ?Procedural Details: ?Prep ?The patient is brought to the cardiac catheterization lab in the fasting state. General anesthesia is induced. The patient is prepped from the groin to chin. A foley catheter is placed. Hemodynamics are monitored via a radial artery line.  ? ?Venous Access ?Using ultrasound guidance, the right femoral vein is punctured. Ultrasound images are captured and stored in the patient's chart. The vein is dilated and 2 Perclose devices are  deplyed at 10' and 2' positions to 'Preclose' the femoral vein. An 8 Fr sheath is inserted. ? ?Transseptal Puncture ?A Baylis Versacross wire is advanced into the SVC ?A Baylis transseptal dilator is advanced into the SVC, and the VersaCross RF wire is retracted into the dilator  ?The transseptal sheath is retracted into the RA under fluoroscopic and echo guidance to obtain position on the posterior fossa where echo measurements are made to assure appropriate access to the mitral valve. Once proper position is confirmed by echo, RF energy is delivered and the VersaCross wire is advanced into the LA without resistance. The dilator and sheath are advanced over the wire where proper position is confirmed by echo and pressure measurement ?Weight based IV heparin is administered and a therapeutic ACT > 250 is confirmed ? ?Steerable Guide Catheter Insertion ?The VersaCross wire is positioned at the left upper pulmonary vein ?The femoral vein is progressively dilated and the 24 Fr Steerable guide catheter is inserted and then directed across the interatrial septum over the wire. Position is confirmed approximately 3 cm into the left atrium ?The guide is de-aired  ? ?MitraClip Insertion ?The MitraClip XTW is prepped per protocol and inserted via the introducer into the steerable guide catheter ?The Clip Delivery System (CDS) is advanced under fluoro and echo guidance so that the sleeve markers are evenly spaced on each side of the guide marker ? ?MitraClip Positioning in the Left Atrium (Supravalvular Alignment) ?M-knob is applied to bring the Clip towards the mitral valve. Echo guidance is used to avoid contact with LA structures. ?The Clip arms are opened to 180 degrees ?2D and 3D TEE imaging is performed in multiple planes and the Clip is positioned and aligned above the valve using standard steering techniques  ? ?Entry into the Left Ventricle and Mitral Valve Leaflet Grasp ?The  Clip is advanced across the mitral valve  into the LV, maintaining proper orientation ?The Clip arms are opened to 120 degrees and the Clip is slowly retracted  ?Capture of both the anterior and posterior leaflets are visualized by echo and the grippers are dropped ? ?MitraClip Deployment ?After extensive echo evaluation, reduction in mitral regurgitation is felt to be adequate ?Following standard protocol, the lock line is removed after testing the lock mechanism. The lock is rechecked and is shown to be intact. The MitraClip device is deployed and the clip delivery system is removed under echo guidance with caution taken to avoid contact with LA structures ? ?Device Removal ?The clip delivery system is removed under echo guidance ?The steerable guide catheter is retracted into the right atrium and the interatrial septum is assessed by echo without evidence of right-to-left shunting or large ASD ? ?Hemostasis ?The guide catheter is removed over a 0.035" wire and the Perclose sutures are tightened with complete hemostasis and no evidence of hematoma ? ?Estimated blood loss: minimal ? ?There are no immediate procedural complications. The patient is transferred to the post-procedure recovery area in stable condition.  ? ?[name] ?11/20/2021 ?10:05 AM ? ? ? ? ? ? ? ?

## 2021-11-20 NOTE — Progress Notes (Signed)
Dr. Deatra Canter with anesthesia made aware that patient states he drank a bottle of water at 3:00 am. Dr. Deatra Canter also made aware that patient did not take Metoprolol (Toprol-XL) 25 mg this morning. Last dose was at 9 am on 11/19/21. BP 134/62 and pulse 55. No new orders received.  ?

## 2021-11-20 NOTE — TOC Initial Note (Signed)
Transition of Care (TOC) - Initial/Assessment Note  ? ? ?Patient Details  ?Name: [name] ?MRN: [id number] ?Date of Birth: [birth year] ? ?Transition of Care (TOC) CM/SW Contact:    ?Angelita Ingles, RN ?Phone Number:[phone] ? ?11/20/2021, 2:46 PM ? ?Clinical Narrative:                 ? ?Transition of Care (TOC) Screening Note ? ? ?Patient Details  ?Name: [name] ?Date of Birth: [birth year] ? ? ?Transition of Care (TOC) CM/SW Contact:    ?Angelita Ingles, RN ?Phone Number: ?11/20/2021, 2:46 PM ? ? ? ?Transition of Care Department [facility]) has reviewed patient and no TOC needs have been identified at this time. We will continue to monitor patient advancement through interdisciplinary progression rounds.  ? ? ? ?  ?  ? ? ?Patient Goals and CMS Choice ?  ?  ?  ? ?Expected Discharge Plan and Services ?  ?  ?  ?  ?  ?                ?  ?  ?  ?  ?  ?  ?  ?  ?  ?  ? ?Prior Living Arrangements/Services ?  ?  ?  ?       ?  ?  ?  ?  ? ?Activities of Daily Living ?  ?  ? ?Permission Sought/Granted ?  ?  ?   ?   ?   ?   ? ?Emotional Assessment ?  ?  ?  ?  ?  ?  ? ?Admission diagnosis:  S/P mitral valve clip implantation [E72.094, Z95.818] ?Patient Active Problem List  ? Diagnosis Date Noted  ? CAD S/P percutaneous coronary angioplasty 11/20/2021  ? 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 Severe mitral regurgitation   ? Acute on chronic heart failure ([place]) 08/22/2021  ? Opioid use disorder 08/22/2021  ? Cellulitis 04/26/2021  ? Venous stasis dermatitis of both lower extremities 04/26/2021  ? PAD (peripheral artery disease) ([place]) 04/26/2021  ? Aortic atherosclerosis ([place]) 04/26/2021  ? Open leg wound, unspecified laterality, initial encounter   ? HLD (hyperlipidemia) 11/27/2020  ? Prediabetes 10/13/2020  ? Non-ST elevation (NSTEMI) myocardial infarction [facility])   ? Hx of adenomatous colonic polyps   ? Benign neoplasm of cecum   ? Benign neoplasm of ascending colon   ? Benign neoplasm of transverse  colon   ? Benign neoplasm of descending colon   ? Benign neoplasm of sigmoid colon   ? Long term current use of amiodarone 01/24/2018  ? GI bleed 02/15/2017  ? Pancytopenia, acquired ([place]) 11/26/2016  ? Tubular adenoma 11/25/2016  ? Gout 11/02/2016  ? Encounter for central line placement 05/25/2016  ? Hepatitis C antibody test positive   ? Hematuria 11/09/2014  ? Acute kidney injury ([place]) 11/01/2014  ? Leukopenia due to antineoplastic chemotherapy ([place]) 08/24/2014  ? Thrombocytopenia due to drugs 07/25/2014  ? Encounter for screening colonoscopy 07/02/2014  ? Chronic anticoagulation 07/02/2014  ? Acute pulmonary edema ([place]) 06/05/2014  ? Prolonged QT interval 06/05/2014  ? Sinus bradycardia 06/05/2014  ? Opioid dependence ([place]) 06/05/2014  ? Diastolic dysfunction 70/96/2836  ? COPD (chronic obstructive pulmonary disease) ([place]) 05/30/2014  ? Health care maintenance 05/24/2014  ? Acute respiratory failure with hypoxia and hypercapnia ([place]) 04/29/2014  ? Anemia due to antineoplastic chemotherapy 04/24/2014  ? Thyroid mass 08/26/2013  ? 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 Polycythemia vera ([place]) 01/03/2013  ?  Malnutrition of moderate degree ([place]) 12/15/2012  ? Tobacco abuse 09/15/2012  ? 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 Alcoholism ([place]) 09/30/2010  ? Hypertension 09/30/2010  ? Atrial fibrillation ([place])   ? ?PCP:  Marianna Payment, MD ?Pharmacy:   ?[facility] ?[address] ?[place] [address] ?Phone: [phone] Fax: [phone] ? ?[facility] [phone] - [place], [address] ?[address] ?[place] [address] ?Phone: [phone] Fax: [phone] ? ? ? ? ?Social Determinants of Health (SDOH) Interventions ?  ? ?Readmission Risk Interventions ? ?  08/24/2021  ? 10:25 AM 05/17/2019  ?  1:32 PM  ?Readmission Risk Prevention Plan  ?Transportation Screening Complete Complete   ?PCP or Specialist Appt within 3-5 Days Complete   ?[place] or Home Care Consult Complete   ?Social Work Consult for [place] Planning/Counseling Complete   ?Palliative Care Screening Not Applicable   ?Medication Review Press photographer) Complete Complete  ?PCP or Specialist appointment within 3-5 days of discharge  Patient refused  ?[place] or Home Care Consult  Patient refused  ?[place]  Not Applicable  ? ? ? ?

## 2021-11-21 ENCOUNTER — Inpatient Hospital Stay (HOSPITAL_COMMUNITY): Payer: Medicare Other

## 2021-11-21 ENCOUNTER — Encounter (HOSPITAL_COMMUNITY): Payer: Medicare Other

## 2021-11-21 DIAGNOSIS — I083 Combined rheumatic disorders of mitral, aortic and tricuspid valves: Secondary | ICD-10-CM | POA: Diagnosis not present

## 2021-11-21 DIAGNOSIS — I5043 Acute on chronic combined systolic (congestive) and diastolic (congestive) heart failure: Secondary | ICD-10-CM | POA: Diagnosis not present

## 2021-11-21 DIAGNOSIS — Z952 Presence of prosthetic heart valve: Secondary | ICD-10-CM | POA: Diagnosis not present

## 2021-11-21 DIAGNOSIS — Z006 Encounter for examination for normal comparison and control in clinical research program: Secondary | ICD-10-CM | POA: Diagnosis not present

## 2021-11-21 DIAGNOSIS — I5041 Acute combined systolic (congestive) and diastolic (congestive) heart failure: Secondary | ICD-10-CM

## 2021-11-21 DIAGNOSIS — I130 Hypertensive heart and chronic kidney disease with heart failure and stage 1 through stage 4 chronic kidney disease, or unspecified chronic kidney disease: Secondary | ICD-10-CM | POA: Diagnosis not present

## 2021-11-21 DIAGNOSIS — Z95818 Presence of other cardiac implants and grafts: Secondary | ICD-10-CM

## 2021-11-21 DIAGNOSIS — Z9889 Other specified postprocedural states: Secondary | ICD-10-CM

## 2021-11-21 LAB — ECHOCARDIOGRAM COMPLETE
AR max vel: 2.4 cm2
AV Area VTI: 2.51 cm2
AV Area mean vel: 2.44 cm2
AV Mean grad: 9 mmHg
AV Peak grad: 17.6 mmHg
Ao pk vel: 2.1 m/s
Height: 66 in
MV VTI: 1.92 cm2
P 1/2 time: 662 msec
S' Lateral: 4.7 cm
Weight: 3167.57 oz

## 2021-11-21 LAB — POCT ACTIVATED CLOTTING TIME
Activated Clotting Time: 293 seconds
Activated Clotting Time: 396 seconds

## 2021-11-21 LAB — CBC
HCT: 47.1 % (ref 39.0–52.0)
Hemoglobin: 15.5 g/dL (ref 13.0–17.0)
MCH: 30.4 pg (ref 26.0–34.0)
MCHC: 32.9 g/dL (ref 30.0–36.0)
MCV: 92.4 fL (ref 80.0–100.0)
Platelets: 305 10*3/uL (ref 150–400)
RBC: 5.1 MIL/uL (ref 4.22–5.81)
RDW: 18.2 % — ABNORMAL HIGH (ref 11.5–15.5)
WBC: 15.1 10*3/uL — ABNORMAL HIGH (ref 4.0–10.5)
nRBC: 0 % (ref 0.0–0.2)

## 2021-11-21 LAB — BASIC METABOLIC PANEL
Anion gap: 10 (ref 5–15)
BUN: 59 mg/dL — ABNORMAL HIGH (ref 8–23)
CO2: 27 mmol/L (ref 22–32)
Calcium: 8.8 mg/dL — ABNORMAL LOW (ref 8.9–10.3)
Chloride: 100 mmol/L (ref 98–111)
Creatinine, Ser: 2.56 mg/dL — ABNORMAL HIGH (ref 0.61–1.24)
GFR, Estimated: 26 mL/min — ABNORMAL LOW (ref >60)
Glucose, Bld: 116 mg/dL — ABNORMAL HIGH (ref 70–99)
Potassium: 5.1 mmol/L (ref 3.5–5.1)
Sodium: 137 mmol/L (ref 135–145)

## 2021-11-21 MED ORDER — POTASSIUM CHLORIDE CRYS ER 20 MEQ PO TBCR: 20.0000 meq | EXTENDED_RELEASE_TABLET | Freq: Every day | ORAL | 2 refills | Status: DC

## 2021-11-21 MED ORDER — METOPROLOL SUCCINATE ER 25 MG PO TB24: 12.5000 mg | ORAL_TABLET | Freq: Every day | ORAL | 3 refills | Status: AC

## 2021-11-21 NOTE — TOC Progression Note (Signed)
Transition of Care (TOC) - Progression Note  ? ? ?Patient Details  ?Name: Singleton Hickox Colombani ?MRN: [id number] ?Date of Birth: Dec 07, 1945 ? ?Transition of Care (TOC) CM/SW Contact  ?Angelita Ingles, RN ?Phone [phone] ? ?11/21/2021, 9:28 AM ? ?Clinical Narrative:    ?SW aware of transportation needs and will follow ? ? ?  ?  ? ?Expected Discharge Plan and Services ?  ?  ?  ?  ?  ?                ?  ?  ?  ?  ?  ?  ?  ?  ?  ?  ? ? ?Social Determinants of Health (SDOH) Interventions ?  ? ?Readmission Risk Interventions ? ?  08/24/2021  ? 10:25 AM 05/17/2019  ?  1:32 PM  ?Readmission Risk Prevention Plan  ?Transportation Screening Complete Complete  ?PCP or Specialist Appt within 3-5 Days Complete   ?[place] or Home Care Consult Complete   ?Social Work Consult for [place] Planning/Counseling Complete   ?Palliative Care Screening Not Applicable   ?Medication Review Press photographer) Complete Complete  ?PCP or Specialist appointment within 3-5 days of discharge  Patient refused  ?[place] or Home Care Consult  Patient refused  ?[place]  Not Applicable  ? ? ?

## 2021-11-21 NOTE — Plan of Care (Signed)

## 2021-11-21 NOTE — Progress Notes (Signed)
CARDIAC REHAB PHASE I  ? ?PRE:  Rate/Rhythm: 52 SB ? ?  BP: sitting 106/56 ? ?  SaO2: 89-91 RA ? ?MODE:  Ambulation: 200 ft  ? ?POST:  Rate/Rhythm: 67 ? ?  BP: sitting 140/53  ? ?  SaO2: 91-92 RA, 94 RA in recliner ? ?Pt able to ambulate with his cane, slow and steady. Denied SOB, sts he feels much better. SaO2 88-91 during walk, has baseline COPD. To recliner. ? ?Discussed smoking cessation, walking, restrictions, and CRPII. Pt receptive. Sts he plans to quit smoking. Gave tips and resources. Encouraged him to ask his wife to atleast go outside. Not interested in CRPII.  ?[id number]  ? ?Yves Dill CES, ACSM ?11/21/2021 ?10:39 AM ? ? ? ? ?

## 2021-11-21 NOTE — Progress Notes (Addendum)
Noticed patients femoral site was more  bruised than this morning and the pad was half soaked with new blood. Held pressure, bleeding stopped, and put on a new pressure dressing. Right femoral and pedal pulses present. Made NP Glenford Peers aware, she came and looked at it and reinforced education to patient, still okay to discharge. Will continue to monitor patient.  ?

## 2021-11-21 NOTE — Progress Notes (Signed)
?  Echocardiogram ?2D Echocardiogram has been performed. ? [name] ?11/21/2021, 9:09 AM ?

## 2021-11-21 NOTE — Progress Notes (Addendum)
Found the pt femoral site to be moderately bloody with bright warm blood and some bruising around the site. Held pressure for 20 minutes and applied Tegaderm with gauze. Site did not seem to be in active bleeding and site is still soft to touch, femoral and dorsalis pedis pulse present with skin warm to touch. Paged day shift on-call cardiology DR and awaiting response as well as inform pt day shift nurse. Will continue to monitor pt. ? ? ?MD responded, orders to continue observation of femoral site given. Will pass on to day shift RN ?

## 2021-11-21 NOTE — Progress Notes (Signed)
?  HEART AND VASCULAR CENTER   ?MULTIDISCIPLINARY HEART VALVE TEAM  ? ?Called by RN regarding patients groin site after ambulation with CR. Site has more ecchymosis than previous assessment earlier today however groin is soft without tenderness. Reviewed post procedure site care once again with the patient with understanding. To call with any signs of bleeding, swelling, or pain. Has follow up with myself 5/10 at the [facility] office.  ? ?Kathyrn Drown NP-C ?Structural Heart Team  ?Pager: [phone] ?Phone: [phone] ? ?

## 2021-11-21 NOTE — Anesthesia Postprocedure Evaluation (Signed)
Anesthesia Post Note ? ?Patient: [name] ? ?Procedure(s) Performed: MITRAL VALVE REPAIR ?TRANSESOPHAGEAL ECHOCARDIOGRAM (TEE) ? ?  ? ?Patient location during evaluation: PACU ?Anesthesia Type: General ?Level of consciousness: awake and alert ?Pain management: pain level controlled ?Vital Signs Assessment: post-procedure vital signs reviewed and stable ?Respiratory status: spontaneous breathing, nonlabored ventilation, respiratory function stable and patient connected to nasal cannula oxygen ?Cardiovascular status: blood pressure returned to baseline and stable ?Postop Assessment: no apparent nausea or vomiting ?Anesthetic complications: no ? ? ?There were no known notable events for this encounter. ? ?Last Vitals:  ?Vitals:  ? 11/20/21 2326 11/21/21 0428  ?BP: 96/64 110/61  ?Pulse: (!) 55 60  ?Resp: 16 16  ?Temp: 36.4 ?C 36.4 ?C  ?SpO2: 93% 92%  ?  ?Last Pain:  ?Vitals:  ? 11/21/21 0745  ?TempSrc:   ?PainSc: 0-No pain  ? ? ?  ?  ?  ?  ?  ?  ? ?Suzette Battiest E ? ? ? ? ?

## 2021-11-21 NOTE — Plan of Care (Signed)
  Problem: Health Behavior/Discharge Planning: Goal: Ability to manage health-related needs will improve Outcome: Progressing   Problem: Clinical Measurements: Goal: Ability to maintain clinical measurements within normal limits will improve Outcome: Progressing Goal: Will remain free from infection Outcome: Progressing Goal: Diagnostic test results will improve Outcome: Progressing Goal: Respiratory complications will improve Outcome: Progressing   

## 2021-11-23 LAB — BPAM RBC
Blood Product Expiration Date: 202306032359
Blood Product Expiration Date: 202306032359
Unit Type and Rh: 5100
Unit Type and Rh: 5100

## 2021-11-23 LAB — TYPE AND SCREEN
ABO/RH(D): O POS
Antibody Screen: NEGATIVE
Unit division: 0
Unit division: 0

## 2021-11-24 ENCOUNTER — Telehealth: Payer: Self-pay

## 2021-11-24 NOTE — Telephone Encounter (Signed)
Transition Care Management Follow-up Telephone Call ?Date of discharge and from where: [name] 11/21/21 ?How have you been since you were released from the hospital? "I am feeling fine, no problems" ?Any questions or concerns? No ? ?Items Reviewed: ?Did the pt receive and understand the discharge instructions provided? Yes  ?Medications obtained and verified? Yes  ?Other? No  ?Any new allergies since your discharge? No  ?Dietary orders reviewed? No ?Do you have support at home? Yes - Family ? ?Home Care and Equipment/Supplies: ?Were home health services ordered? no ?If so, what is the name of the agency? N/A  ?Has the agency set up a time to come to the patient's home? not applicable ?Were any new equipment or medical supplies ordered?  No ?What is the name of the medical supply agency? N/A ?Were you able to get the supplies/equipment? not applicable ?Do you have any questions related to the use of the equipment or supplies? No ? ?Functional Questionnaire: (I = Independent and D = Dependent) ?ADLs: I ? ?Bathing/Dressing- I ? ?Meal Prep- I ? ?Eating- I ? ?Maintaining continence- I ? ?Transferring/Ambulation- I ? ?Managing Meds- I ? ?Follow up appointments reviewed: ? ?PCP Hospital f/u appt confirmed? No   ?Specialist Hospital f/u appt confirmed? Yes  Scheduled to see Cardiology on 11/26/21 @ 10:15. ?Are transportation arrangements needed? No  ?If their condition worsens, is the pt aware to call PCP or go to the Emergency Dept.? Yes ?Was the patient provided with contact information for the PCP's office or ED? Yes ?Was to pt encouraged to call back with questions or concerns? Yes ?Johnney Killian, RN, BSN, CCM ?Care Management Coordinator ?[facility] ?Phone: [phone]/YDX: [phone]   ?

## 2021-11-24 NOTE — Progress Notes (Signed)
Please forward your concern to Sammuel Bailiff or Angelena Form.  Thank you. ?

## 2021-11-25 NOTE — Progress Notes (Signed)
?[facility]   ?[place] ?                                    ?Cardiology Office Note:   ? ?Date:  11/26/2021  ? ?ID:  [name], DOB [birth year], MRN [id number] ? ?PCP:  Ryan Payment, MD  ?[facility] Cardiologist:  Ryan Grooms, MD  ?[facility] Electrophysiologist:  Ryan Peru, MD  ? ?Referring MD: Ryan Payment, MD  ? ?Chief Complaint  ?Patient presents with  ? Follow-up  ?  1 week s/p TEER  ? ?History of Present Illness:   ? ?[name] is a 76 y.o. male with a hx of COPD, CAD/NSTEMI s/p PCI to mid LAD 09/2020, HTN, persistent atrial flutter s/p ablation 2012 with recurrent AF requiring DCCV 09/2021, MDS, HTN, CKDIIIb, ongoing tobacco use disorder with severe COPD, polycythemia vera, chronic pain on suboxone, chronic combined systolic and diastolic CHF with LVEF at 35-40% on most recent echo, and severe mitral regurgitation/tricuspid regurgitation who presents today s/p TEER 11/20/21. ?  ?[name] has a long history of combined CHF dating back to 2012 with an LVEF found to be 35%, felt to be secondary to polysubstance abuse and atrial flutter. He underwent an atrial flutter ablation in 2012. EF reportedly improved to 55% by 2014, then back down again in 2020 to 35-40%. He struggled to maintain NSR on amiodarone with break through afib after flutter ablation. He has had multiple hospitalizations over the years, mostly in the setting of respiratory failure due to severe COPD. He continues to smoke cigarettes. In 09/2020 he was admitted with chest pain/NSTEMI and dyspnea, found to have single vessel CAD with LAD disease and received PCI to mid LAD. Echo at that time showed moderately reduced LVEF at 40-45% with mod/severe MR and normal RV. He was diuresed and discharged from the hospital on metoprolol, lasix, farxiga, amiodarone, eliquis and DAPT.  ?  ?Most recently he was admitted 08/2021 with acute on chronic systolic heart  failure. He was back in atrial fibrillation. Repeat echocardiogram with LVEF at 35-40% and normal RV with progression of his mitral regurgitation to severe with an ERO at 0.34cm2, also with moderate AI and severe TR. He was diuresed with symptom improvement. He was then referred to AHF clinic and was seen by their team 09/09/21 at which time he was fluid overloaded. His medications were adjusted and he was referred to Structural Heart for further workup of his severe mitral regurgitation.  ?  ?He was seen by Dr. Ali Hamilton 09/26/21. Plan was to pursue TEE (11/07/21) to evaluate his mitral and tricuspid valves which showed an EF at 40-45%, mean gradient at 70mHg, [place] gradient at 6.570mg, MVA 6.31cm2 to 7.78 cm2. MR PISA 0.48cm2, with a broad-based centralized jet, central. This was felt to be an adequate MVA area for TEER with adequate leaflet length.  ?  ?He was then evaluated by the multidisciplinary valve team and felt to have severe, symptomatic mitral regurgitation and to be a suitable candidate and is now s/p successful mitral transcatheter edge-to-edge repair with 1 XTW clip located at A2 P2 reducing severe mitral regurgitation (4+) to mild mitral regurgitation (1+) with normalization of pulmonary vein flow. Post procedure echo with A2-P2 clip and mild mitral valve regurgitation, +1 at most with mild mitral stenosis. ?Cumulative MV Area of double orifice 2.1 cm2. ?The mean mitral  valve gradient is 3.0 mmHg with average heart rate of 50 bpm. He was continued on home Eliquis. SBE discussed. Post procedure Cr elevated to 2.56 with a baseline at @ 2.0.  ? ?Today he is here alone. He states that his breathing is much improved. He biggest complaint today is chronic back pain. He is walking with a walker but does state that his exertional SOB is gone. He denies LE edema, orthopnea, palpitations, dizziness, chest pain, or syncope.  ? ?Past Medical History:  ?Diagnosis Date  ? Acute lower GI bleeding 02/15/2017  ? Atrial  fibrillation ([place])   ? Cardiomyopathy   ? EF55% 11/14<<35%   ? Cardiomyopathy ([place])   ? CHF (congestive heart failure) ([place])   ? COPD (chronic obstructive pulmonary disease) ([place])   ? Difficult airway for intubation   ? per telephone encounter notation  ? Dyspnea   ? Essential hypertension   ? GERD (gastroesophageal reflux disease)   ? Gout   ? Hepatitis   ? Possible history  ? History of atrial flutter   ? Ablation 2012  ? Hypertension   ? 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 Obesity   ? Pneumonia X 1  ? Primary polycythemia ([place]) 06/12/2013  ? Renal disorder   ? Renal insufficiency   ? 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 XTW x1 MitraClip with Dr. Ali Hamilton  ? Severe mitral regurgitation   ? Substance abuse ([place])   ? History of alcohol; hx cocaine, heroin, crack use  ? Syncope   ? Tubular adenoma of colon 08/2014  ? ? ?Past Surgical History:  ?Procedure Laterality Date  ? CARDIAC CATHETERIZATION    ? CARDIAC ELECTROPHYSIOLOGY MAPPING AND ABLATION  08/2010  ? [name] 09/07/2010 (12/14/2012)  ? CARDIAC SURGERY    ? CARDIOVERSION N/A 10/16/2021  ? Procedure: CARDIOVERSION;  Surgeon: Larey Dresser, MD;  Location: [facility] ENDOSCOPY;  Service: Cardiovascular;  Laterality: N/A;  ? COLONOSCOPY    ? 15-20 years ago had colon in [place]  ? COLONOSCOPY WITH PROPOFOL N/A 02/12/2020  ? Procedure: COLONOSCOPY WITH PROPOFOL;  Surgeon: Ladene Artist, MD;  Location: [facility] ENDOSCOPY;  Service: Endoscopy;  Laterality: N/A;  ? CORONARY STENT INTERVENTION N/A 10/10/2020  ? Procedure: CORONARY STENT INTERVENTION;  Surgeon: Lorretta Harp, MD;  Location: [place] CV LAB;  Service: Cardiovascular;  Laterality: N/A;  ? EXCISIONAL HEMORRHOIDECTOMY  1970's  ? LOOP RECORDER IMPLANT N/A 08/23/2013  ? Procedure: LOOP RECORDER IMPLANT;  Surgeon: Deboraha Sprang, MD;  Location: [facility] CATH LAB;  Service: Cardiovascular;  Laterality: N/A;  ? MITRAL VALVE REPAIR N/A 11/20/2021  ? Procedure: MITRAL VALVE REPAIR;  Surgeon: [name], MD;  Location: [place] CV [facility];  Service: Cardiovascular;  Laterality: N/A;  ? MULTIPLE EXTRACTIONS WITH ALVEOLOPLASTY Bilateral 01/24/2016  ? Procedure: MULTIPLE EXTRACTION WITH ALVEOLOPLASTY BILATERAL;  Surgeon: Diona Browner, DDS;  Location: [place];  Service: Oral Surgery;  Laterality: Bilateral;  ? MULTIPLE TOOTH EXTRACTIONS  01/24/2016  ? MULTIPLE EXTRACTION WITH ALVEOLOPLASTY BILATERAL (Bilateral)  ? POLYPECTOMY  02/12/2020  ? Procedure: POLYPECTOMY;  Surgeon: Ladene Artist, MD;  Location: [facility];  Service: Endoscopy;;  ? RIGHT HEART CATH N/A 10/03/2021  ? Procedure: RIGHT HEART CATH;  Surgeon: [name], MD;  Location: [place] [facility];  Service: Cardiovascular;  Laterality: N/A;  ? RIGHT/LEFT HEART CATH AND CORONARY ANGIOGRAPHY N/A 10/09/2020  ? Procedure: RIGHT/LEFT HEART CATH AND CORONARY ANGIOGRAPHY;  Surgeon: Burnell Blanks, MD;  Location:  [place] INVASIVE CV LAB;  Service: Cardiovascular;  Laterality: N/A;  ? TEE WITHOUT CARDIOVERSION N/A 10/16/2021  ? Procedure: TRANSESOPHAGEAL ECHOCARDIOGRAM (TEE);  Surgeon: Larey Dresser, MD;  Location: [facility] ENDOSCOPY;  Service: Cardiovascular;  Laterality: N/A;  ? TEE WITHOUT CARDIOVERSION N/A 11/07/2021  ? Procedure: TRANSESOPHAGEAL ECHOCARDIOGRAM (TEE);  Surgeon: Werner Lean, MD;  Location: [place];  Service: Cardiovascular;  Laterality: N/A;  ? TEE WITHOUT CARDIOVERSION N/A 11/20/2021  ? Procedure: TRANSESOPHAGEAL ECHOCARDIOGRAM (TEE);  Surgeon: [name], MD;  Location: [place] CV [facility];  Service: Cardiovascular;  Laterality: N/A;  ? ? ?Current Medications: ?Current Meds  ?Medication Sig  ? acetaminophen (TYLENOL) 500 MG tablet Take 1,000 mg by mouth every 6 (six) hours as needed for mild pain.  ? albuterol (PROAIR HFA) 108 (90 Base) MCG/ACT inhaler Inhale 1-2 puffs into the lungs every 6 (six) hours as needed for wheezing or shortness of breath.  ? amiodarone (PACERONE) 200 MG tablet Take 1 tablet (200 mg  total) by mouth daily.  ? apixaban (ELIQUIS) 5 MG TABS tablet Take 1 tablet (5 mg total) by mouth 2 (two) times daily.  ? atorvastatin (LIPITOR) 80 MG tablet Take 1 tablet (80 mg total) by mouth daily.  ? camphor

## 2021-11-26 ENCOUNTER — Ambulatory Visit (INDEPENDENT_AMBULATORY_CARE_PROVIDER_SITE_OTHER): Payer: Medicare Other | Admitting: Cardiology

## 2021-11-26 VITALS — BP 120/60 | HR 56 | Ht 66.0 in | Wt 198.6 lb

## 2021-11-26 DIAGNOSIS — Z9861 Coronary angioplasty status: Secondary | ICD-10-CM

## 2021-11-26 DIAGNOSIS — N183 Chronic kidney disease, stage 3 unspecified: Secondary | ICD-10-CM

## 2021-11-26 DIAGNOSIS — Z9889 Other specified postprocedural states: Secondary | ICD-10-CM | POA: Diagnosis not present

## 2021-11-26 DIAGNOSIS — R001 Bradycardia, unspecified: Secondary | ICD-10-CM

## 2021-11-26 DIAGNOSIS — I480 Paroxysmal atrial fibrillation: Secondary | ICD-10-CM

## 2021-11-26 DIAGNOSIS — I340 Nonrheumatic mitral (valve) insufficiency: Secondary | ICD-10-CM

## 2021-11-26 DIAGNOSIS — E785 Hyperlipidemia, unspecified: Secondary | ICD-10-CM

## 2021-11-26 DIAGNOSIS — I5042 Chronic combined systolic (congestive) and diastolic (congestive) heart failure: Secondary | ICD-10-CM

## 2021-11-26 DIAGNOSIS — Z95818 Presence of other cardiac implants and grafts: Secondary | ICD-10-CM

## 2021-11-26 DIAGNOSIS — I2510 Atherosclerotic heart disease of native coronary artery without angina pectoris: Secondary | ICD-10-CM

## 2021-11-26 LAB — CBC
Hematocrit: 47.3 % (ref 37.5–51.0)
Hemoglobin: 15.7 g/dL (ref 13.0–17.7)
MCH: 30.2 pg (ref 26.6–33.0)
MCHC: 33.2 g/dL (ref 31.5–35.7)
MCV: 91 fL (ref 79–97)
Platelets: 286 10*3/uL (ref 150–450)
RBC: 5.2 x10E6/uL (ref 4.14–5.80)
RDW: 17 % — ABNORMAL HIGH (ref 11.6–15.4)
WBC: 9.8 10*3/uL (ref 3.4–10.8)

## 2021-11-26 LAB — BASIC METABOLIC PANEL
BUN/Creatinine Ratio: 27 — ABNORMAL HIGH (ref 10–24)
BUN: 65 mg/dL — ABNORMAL HIGH (ref 8–27)
CO2: 26 mmol/L (ref 20–29)
Calcium: 8.6 mg/dL (ref 8.6–10.2)
Chloride: 102 mmol/L (ref 96–106)
Creatinine, Ser: 2.4 mg/dL — ABNORMAL HIGH (ref 0.76–1.27)
Glucose: 112 mg/dL — ABNORMAL HIGH (ref 70–99)
Potassium: 4.3 mmol/L (ref 3.5–5.2)
Sodium: 142 mmol/L (ref 134–144)
eGFR: 28 mL/min/{1.73_m2} — ABNORMAL LOW (ref >59)

## 2021-11-26 NOTE — Patient Instructions (Signed)
Medication Instructions:  ?Your physician recommends that you continue on your current medications as directed. Please refer to the Current Medication list given to you today. ? ?*If you need a refill on your cardiac medications before your next appointment, please call your pharmacy* ? ? ?Lab Work: ?Cbc, Bmp- today  ? ?If you have labs (blood work) drawn today and your tests are completely normal, you will receive your results only by: ?MyChart Message (if you have MyChart) OR ?A paper copy in the mail ?If you have any lab test that is abnormal or we need to change your treatment, we will call you to review the results. ? ? ?Testing/Procedures: ?None ordered today  ? ? ?Follow-Up: ?Follow up as scheduled  ? ? ?Other Instructions ? ? ?Important Information About Sugar ? ? ? ? ?  ?

## 2021-12-16 NOTE — Progress Notes (Incomplete)
HEART AND [place]                                     Cardiology Office Note:    Date:  12/23/2021   ID:  [name], DOB 02-01-1946, MRN [id number]  PCP:  Ryan Payment, MD  [name] Cardiologist:  Ryan Osmond, MD  [facility] Electrophysiologist:  Ryan Peru, MD   Referring MD: Ryan Payment, MD   1 month s/p mTEER  History of Present Illness:    [name] is a 76 y.o. male with a hx of CAD/NSTEMI s/p PCI to mid LAD 09/2020, HTN, persistent atrial flutter s/p ablation 2012 with recurrent AF requiring DCCV 09/2021, MDS, HTN, CKDIIIb, ongoing tobacco use disorder with severe COPD, TAA, polycythemia vera, chronic pain on suboxone, chronic combined S/D CHF, mod-severe AI, moderate to severe TR and severe mitral regurgitation s/p mTEER 11/20/21 who presents to clinic for follow up.    [name] has a long history of combined CHF dating back to 2012 with an LVEF found to be 35%, felt to be secondary to polysubstance abuse and atrial flutter. He underwent an atrial flutter ablation in 2012. EF reportedly improved to 55% by 2014, then back down again in 2020 to 35-40%. He struggled to maintain NSR on amiodarone with break through afib after flutter ablation. He has had multiple hospitalizations over the years, mostly in the setting of respiratory failure due to severe COPD. He continues to smoke cigarettes. In 09/2020 he was admitted with chest pain/NSTEMI and dyspnea, found to have single vessel CAD with LAD disease and received PCI to mid LAD. Echo at that time showed moderately reduced LVEF at 40-45% with mod/severe MR and normal RV. He was diuresed and discharged from the hospital on metoprolol, lasix, farxiga, amiodarone, eliquis and DAPT.    Most recently he was admitted 08/2021 with acute on chronic systolic heart failure. He was back in atrial fibrillation. Repeat echocardiogram with LVEF at 35-40% and  normal RV with progression of his mitral regurgitation to severe with an ERO at 0.34cm2, also with moderate AI and severe TR. He was diuresed with symptom improvement. He was then referred to AHF clinic and was seen by their team 09/09/21 at which time he was fluid overloaded. His medications were adjusted and he was referred to Structural Heart for further workup of his severe mitral regurgitation.    He was seen by Dr. Ali Hamilton 09/26/21. TEE (11/07/21) showed an EF at 40-45%, mean gradient at 73mHg, peak gradient at 6.529mg, MVA 6.31cm2 to 7.78 cm2. MR PISA 0.48cm2, with a broad-based centralized jet, central. This was felt to be an adequate MVA area for TEER with adequate leaflet length.    He was then evaluated by the multidisciplinary valve team and felt to have severe, symptomatic mitral regurgitation and to be a suitable candidate and is now s/p successful mitral transcatheter edge-to-edge repair with 1 XTW clip located at A2 P2 reducing severe mitral regurgitation (4+) to mild mitral regurgitation (1+) with normalization of pulmonary vein flow. Post procedure echo showed EF 50-55% with A2-P2 clip and mild mitral valve regurgitation, +1 at most with mild mitral stenosis. Cumulative MV Area of double orifice 2.1 cm2. The mean mitral valve gradient is 3.0 mmHg with average heart rate of 50 bpm. He was continued on home Eliquis. Post procedure Cr elevated to  2.56 with a baseline at @ 2.0.   Today he presents to clinic for follow up. No CP or SOB. No LE edema, orthopnea or PND. No dizziness or syncope. No blood in stool or urine. No palpitations. Has chronic back pain. Has had a big improvement in his symptoms since clip .   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 Ali Hamilton   Severe mitral regurgitation    Substance abuse ([place])    History of alcohol; hx cocaine, heroin, crack use   Syncope    Tubular adenoma of colon 08/2014    Past Surgical History:  Procedure Laterality Date   [place] AND ABLATION  08/2010   [name] 09/07/2010 (12/14/2012)   CARDIAC SURGERY     CARDIOVERSION N/A 10/16/2021   Procedure: CARDIOVERSION;  Surgeon: Larey Dresser, MD;  Location: [place];  Service: Cardiovascular;  Laterality: N/A;   COLONOSCOPY     15-20 years ago had colon in [place] N/A 02/12/2020   Procedure: COLONOSCOPY WITH PROPOFOL;  Surgeon: Ladene Artist, MD;  Location: [facility] ENDOSCOPY;  Service: Endoscopy;  Laterality: N/A;   CORONARY STENT INTERVENTION N/A 10/10/2020   Procedure: CORONARY STENT INTERVENTION;  Surgeon: Lorretta Harp, MD;  Location: [place] CV LAB;  Service: Cardiovascular;  Laterality: N/A;   EXCISIONAL HEMORRHOIDECTOMY  1970's   LOOP RECORDER IMPLANT N/A 08/23/2013   Procedure: LOOP RECORDER IMPLANT;  Surgeon: Deboraha Sprang, MD;  Location: [facility] CATH LAB;  Service: Cardiovascular;  Laterality: N/A;   MITRAL VALVE REPAIR N/A 11/20/2021   Procedure: MITRAL VALVE REPAIR;  Surgeon: Ryan Osmond, MD;  Location: [place] CV LAB;  Service: Cardiovascular;  Laterality: N/A;   MULTIPLE EXTRACTIONS WITH ALVEOLOPLASTY Bilateral 01/24/2016   Procedure: MULTIPLE EXTRACTION WITH ALVEOLOPLASTY  BILATERAL;  Surgeon: Diona Browner, DDS;  Location: [place];  Service: Oral Surgery;  Laterality: Bilateral;   MULTIPLE TOOTH EXTRACTIONS  01/24/2016   MULTIPLE EXTRACTION WITH ALVEOLOPLASTY BILATERAL (Bilateral)   POLYPECTOMY  02/12/2020   Procedure: POLYPECTOMY;  Surgeon: Ladene Artist, MD;  Location: [facility];  Service: Endoscopy;;   RIGHT HEART CATH N/A 10/03/2021   Procedure: RIGHT HEART CATH;  Surgeon: Ryan Osmond, MD;  Location: [place] CV LAB;  Service: Cardiovascular;  Laterality: N/A;   RIGHT/LEFT HEART CATH AND CORONARY ANGIOGRAPHY N/A 10/09/2020   Procedure: RIGHT/LEFT HEART CATH AND CORONARY ANGIOGRAPHY;  Surgeon: Burnell Blanks, MD;  Location: [place] CV LAB;  Service: Cardiovascular;  Laterality: N/A;   TEE WITHOUT CARDIOVERSION N/A 10/16/2021   Procedure: TRANSESOPHAGEAL ECHOCARDIOGRAM (TEE);  Surgeon: Loralie Champagne  S, MD;  Location: [place];  Service: Cardiovascular;  Laterality: N/A;   TEE WITHOUT CARDIOVERSION N/A 11/07/2021   Procedure: TRANSESOPHAGEAL ECHOCARDIOGRAM (TEE);  Surgeon: Werner Lean, MD;  Location[facility] ENDOSCOPY;  Service: Cardiovascular;  Laterality: N/A;   TEE WITHOUT CARDIOVERSION N/A 11/20/2021   Procedure: TRANSESOPHAGEAL ECHOCARDIOGRAM (TEE);  Surgeon: Ryan Osmond, MD;  Location: [place] CV LAB;  Service: Cardiovascular;  Laterality: N/A;    Current Medications: Current Meds  Medication Sig   acetaminophen (TYLENOL) 500 MG tablet Take 1,000 mg by mouth every 6 (six) hours as needed for mild pain.   albuterol (PROAIR HFA) 108 (90 Base) MCG/ACT inhaler Inhale 1-2 puffs into the lungs every 6 (six) hours as needed for wheezing or shortness of breath.   amiodarone (PACERONE) 200 MG tablet Take 1 tablet (200 mg total) by mouth daily.   apixaban (ELIQUIS) 5 MG TABS tablet Take 1 tablet (5 mg total) by mouth 2 (two) times daily.   atorvastatin (LIPITOR) 80 MG tablet Take 1 tablet (80 mg total) by mouth daily.    camphor-menthol (SARNA) lotion Apply topically 3 (three) times daily.   dapagliflozin propanediol (FARXIGA) 10 MG TABS tablet Take 1 tablet (10 mg total) by mouth daily before breakfast.   diclofenac Sodium (VOLTAREN) 1 % GEL Apply 2 g topically 4 (four) times daily as needed (as needed for pain).   hydrocortisone cream 1 % Apply 1 application topically 2 (two) times daily.   metoprolol succinate (TOPROL-XL) 25 MG 24 hr tablet Take 0.5 tablets (12.5 mg total) by mouth daily.   potassium chloride SA (KLOR-CON M) 20 MEQ tablet Take 1 tablet (20 mEq total) by mouth daily.   senna-docusate (SENOKOT-S) 8.6-50 MG tablet Take 1 tablet by mouth at bedtime as needed for mild constipation.   spironolactone (ALDACTONE) 25 MG tablet Take 0.5 tablets (12.5 mg total) by mouth daily.   SUBOXONE 8-2 MG FILM Place 2 Film under the tongue daily.   SYMBICORT 160-4.5 MCG/ACT inhaler Inhale 2 puffs into the lungs daily.   torsemide (DEMADEX) 20 MG tablet Take 3 tablets (60 mg total) by mouth 2 (two) times daily.   Current Facility-Administered Medications for the 12/22/21 encounter (Office Visit) with Eileen Stanford, PA-C  Medication   sodium chloride flush (NS) 0.9 % injection 3 mL     Allergies:   Patient has no known allergies.   Social History   Socioeconomic History   Marital status: Unknown    Spouse name: [name]   Number of children: 1   Years of education: Not on file   Highest education level: 11th grade  Occupational History   Occupation: Retired  Tobacco Use   Smoking status: Some Days    Packs/day: 2.00    Years: 50.00    Pack years: 100.00    Types: Cigarettes   Smokeless tobacco: Never   Tobacco comments:    1-2 cigarettes a day for a couple years now. encouraged cessation  Vaping Use   Vaping Use: Never used  Substance and Sexual Activity   Alcohol use: Yes    Alcohol/week: 2.0 standard drinks    Types: 2 Shots of liquor per week   Drug use: Not Currently    Types: Heroin     Comment: stopped use in 2015. currently on suboxone   Sexual activity: Not Currently  Other Topics Concern   Not on file  Social History Narrative   ** Merged History Encounter **  Moved here from [place]. Lives with common law wife and grandchildren. 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."   Social Determinants of Radio broadcast assistant Strain: Not on file  Food Insecurity: Not on file  Transportation Needs: Public librarian (Medical): Yes   Lack of Transportation (Non-Medical): Yes  Physical Activity: Not on file  Stress: Not on file  Social Connections: Not on file     Family History: The patient's family history includes Alcohol abuse in his father; Cirrhosis in his father; Diabetes in his mother; Hypertension in his mother. There is no history of Colon cancer, Rectal cancer, Stomach cancer, Esophageal cancer, or Colon polyps.  ROS:   Please see the history of present illness.    All other systems reviewed and are negative.  EKGs/Labs/Other Studies Reviewed:    The following studies were reviewed today:    Transcatheter mitral valve repair 11/20/21:   Procedure Performed: Ultrasound-guided right transfemoral venous access Double PreClose right femoral vein Transseptal puncture using Bailess RF needle Mitral valve repair with MitraClip XTW   Surgeon: Lenna Sciara, MD  Co-surgeon: Sherren Mocha, MD   Echocardiographer: Renee Harder, MD   Anesthesiologist: Suzette Battiest, MD   Device Implant: Mitraclip XTW, Serial # [id number]   Procedural Indication: Severe Non-rheumatic Mitral Regurgitation (Stage D)    Echo Findings: Preop:  LV systolic dysfunction with EF ~40% Severe MR secondary to atrial enlargement and cardimyopathy, Grade 4+ NYHA class 2-3 Post-op:  Unchanged LV systolic function 1+ residual MR Successful mitral transcatheter edge-to-edge repair with 1 XTW clip located at A2 P2 reducing severe  mitral regurgitation (4+) to mild mitral regurgitation (1+)vwith normalization of pulmonary vein flow. ____________   Echo 11/21/21:  IMPRESSIONS Left ventricular ejection fraction, by estimation, is 50 to 55%. The left ventricle has low normal function. The left ventricle has no regional wall motion abnormalities. The left ventricular internal cavity size was moderately dilated. Left ventricular diastolic parameters are indeterminate. 1. Right ventricular systolic function is normal. The right ventricular size is normal. There is normal pulmonary artery systolic pressure. The estimated right ventricular systolic pressure is 16.6 mmHg. 2. 3. Left atrial size was moderately dilated. The mitral valve has been repaired via A2-P2 clip. Mild mitral valve regurgitation, +1 at most. Mild mitral stenosis. Cumulative MV Area of double orifice 2.1 cm2. The mean mitral valve gradient is 3.0 mmHg with average heart rate of 50 bpm. 4. 5. The tricuspid valve is abnormal. Tricuspid valve regurgitation is mild to moderate. The aortic valve is abnormal. Aortic valve regurgitation is moderate to severe. No aortic stenosis is present. Aortic valve mean gradient measures 9.0 mmHg. 6. 7. The pulmonic valve was abnormal. Aortic dilatation noted. There is moderate dilatation of the ascending aorta, measuring 46 mm. Likely underestimated 8. The inferior vena cava is normal in size with greater than 50% respiratory variability, suggesting right atrial pressure of 3 mmHg. 9. 10. Left to right shunt not well visualized in this study.    EKG:  EKG is NOT ordered today.   Recent Labs: 08/29/2021: Magnesium 2.5 10/23/2021: TSH 2.243 11/18/2021: ALT 35; B Natriuretic Peptide 398.2 11/26/2021: BUN 65; Creatinine, Ser 2.40; Hemoglobin 15.7; Platelets 286; Potassium 4.3; Sodium 142  Recent Lipid Panel    Component Value Date/Time   CHOL 71 08/23/2021 0315   TRIG 66 08/23/2021 0315   HDL 27 (L) 08/23/2021 0315    CHOLHDL 2.6 08/23/2021 0315   VLDL 13 08/23/2021 0315   LDLCALC  31 08/23/2021 0315     Risk Assessment/Calculations:    CHA2DS2-VASc Score = 4   This indicates a 4.8% annual risk of stroke. The patient's score is based upon: CHF History: 1 HTN History: 1 Diabetes History: 0 Stroke History: 0 Vascular Disease History: 1 Age Score: 1 Gender Score: 0      Physical Exam:    VS:  BP 128/72   Pulse 72   Ht '5\' 6"'$  (1.676 m)   Wt 203 lb (92.1 kg)   SpO2 95%   BMI 32.77 kg/m     Wt Readings from Last 3 Encounters:  12/23/21 204 lb 8 oz (92.8 kg)  12/22/21 203 lb (92.1 kg)  11/26/21 198 lb 9.6 oz (90.1 kg)     GEN:  Well nourished, well developed in no acute distress HEENT: Normal NECK: No JVD; No carotid bruits LYMPHATICS: No lymphadenopathy CARDIAC: RRR, no murmurs, rubs, gallops RESPIRATORY:  Clear to auscultation without rales, wheezing or rhonchi  ABDOMEN: Soft, non-tender, non-distended MUSCULOSKELETAL:  No edema; No deformity  SKIN: Warm and dry NEUROLOGIC:  Alert and oriented x 3 PSYCHIATRIC:  Normal affect   ASSESSMENT:    1. S/P mitral valve clip implantation   2. Stage 3 chronic kidney disease, unspecified whether stage 3a or 3b CKD ([place])   3. Chronic combined systolic and diastolic congestive heart failure ([place])   4. CAD S/P percutaneous coronary angioplasty   5. Longstanding persistent atrial fibrillation ([place])   6. Chronic obstructive pulmonary disease, unspecified COPD type ([place])   7. Tricuspid valve insufficiency, unspecified etiology   8. Aortic valve insufficiency, etiology of cardiac valve disease unspecified   9. Thoracic aortic aneurysm without rupture, unspecified part ([place])    PLAN:    In order of problems listed above:  Severe mitral regurgitation s/p mTEER: echo today shows EF 55%, mild to moderate LV dilation, normally functioning MitraClip with trivial residual MR with a mean gradient of 3.3 mm hg as well as moderate AR. He has NYHA  class I symptoms. SBE prophylaxis discussed; the patient is edentulous and does not go to the dentist. Continue on Eliquis alone. We will see him back in 1 year with an echo.   CKD stage IIIb: Cr stable around 2.40   Chronic combined CHF: EF 55% today. He appears euvolemic. Continue current torsemide dosing.   CAD: s/p NSTEMI with PCI/DES to [facility] 09/2020. No anginal symptoms. Continue with secondary prevention.    Persistent atrial fibrillation/flutter: s/p AF ablation 2012 with recurrent AF. He has now been treated with amiodarone and is anticoagulated with Eliquis. Maintaining NSR with stable rates. Continue Toprol 12.'5mg'$  QD.    COPD with ongoing tobacco abuse: Continue home inhalers. Cessation recommended.    Severe TR/ Severe AI: TR trivial by echo today and AI moderate.   TAA: noted to be 4.8 cm on recent chest CT 3/13 and 5m on echo today. Will need follow up CT with semi annual CTAs. This would be due in September. Will defer to Dr. STamala Julianwho he sees 04/06/22     Medication Adjustments/Labs and Tests Ordered: Current medicines are reviewed at length with the patient today.  Concerns regarding medicines are outlined above.  Orders Placed This Encounter  Procedures   ECHOCARDIOGRAM COMPLETE   No orders of the defined types were placed in this encounter.   Patient Instructions  Medication Instructions:  Your physician recommends that you continue on your current medications as directed. Please refer to the Current Medication list  given to you today.  *If you need a refill on your cardiac medications before your next appointment, please call your pharmacy*   Lab Work: NONE If you have labs (blood work) drawn today and your tests are completely normal, you will receive your results only by: [place] (if you have MyChart) OR A paper copy in the mail If you have any lab test that is abnormal or we need to change your treatment, we will call you to review the  results.   Testing/Procedures: Your physician has requested that you have an echocardiogram. Echocardiography is a painless test that uses sound waves to create images of your heart. It provides your doctor with information about the size and shape of your heart and how well your heart's chambers and valves are working. This procedure takes approximately one hour. There are no restrictions for this procedure.    Follow-Up: At [facility], you and your health needs are our priority.  As part of our continuing mission to provide you with exceptional heart care, we have created designated Provider Care Teams.  These Care Teams include your primary Cardiologist (physician) and Advanced Practice Providers (APPs -  Physician Assistants and Nurse Practitioners) who all work together to provide you with the care you need, when you need it.  We recommend signing up for the patient portal called "MyChart".  Sign up information is provided on this After Visit Summary.  MyChart is used to connect with patients for Virtual Visits (Telemedicine).  Patients are able to view lab/test results, encounter notes, upcoming appointments, etc.  Non-urgent messages can be sent to your provider as well.   To learn more about what you can do with MyChart, go to [email or web address].    Your next appointment:   KEEP SCHEDULED APPOINTMENT   Important Information About Sugar         Weston Brass Angelena Form, PA-C  12/23/2021 3:48 PM    [place]

## 2021-12-22 ENCOUNTER — Ambulatory Visit (HOSPITAL_COMMUNITY): Payer: Medicare Other | Attending: Cardiovascular Disease

## 2021-12-22 ENCOUNTER — Ambulatory Visit (INDEPENDENT_AMBULATORY_CARE_PROVIDER_SITE_OTHER): Payer: Medicare Other | Admitting: Physician Assistant

## 2021-12-22 VITALS — BP 128/72 | HR 72 | Ht 66.0 in | Wt 203.0 lb

## 2021-12-22 DIAGNOSIS — I5042 Chronic combined systolic (congestive) and diastolic (congestive) heart failure: Secondary | ICD-10-CM | POA: Diagnosis not present

## 2021-12-22 DIAGNOSIS — Z95818 Presence of other cardiac implants and grafts: Secondary | ICD-10-CM | POA: Insufficient documentation

## 2021-12-22 DIAGNOSIS — I712 Thoracic aortic aneurysm, without rupture, unspecified: Secondary | ICD-10-CM

## 2021-12-22 DIAGNOSIS — I4811 Longstanding persistent atrial fibrillation: Secondary | ICD-10-CM

## 2021-12-22 DIAGNOSIS — J449 Chronic obstructive pulmonary disease, unspecified: Secondary | ICD-10-CM

## 2021-12-22 DIAGNOSIS — N183 Chronic kidney disease, stage 3 unspecified: Secondary | ICD-10-CM | POA: Diagnosis not present

## 2021-12-22 DIAGNOSIS — I071 Rheumatic tricuspid insufficiency: Secondary | ICD-10-CM

## 2021-12-22 DIAGNOSIS — I340 Nonrheumatic mitral (valve) insufficiency: Secondary | ICD-10-CM

## 2021-12-22 DIAGNOSIS — Z9889 Other specified postprocedural states: Secondary | ICD-10-CM

## 2021-12-22 DIAGNOSIS — I2510 Atherosclerotic heart disease of native coronary artery without angina pectoris: Secondary | ICD-10-CM | POA: Diagnosis not present

## 2021-12-22 DIAGNOSIS — Z9861 Coronary angioplasty status: Secondary | ICD-10-CM | POA: Diagnosis not present

## 2021-12-22 DIAGNOSIS — I351 Nonrheumatic aortic (valve) insufficiency: Secondary | ICD-10-CM

## 2021-12-22 LAB — ECHOCARDIOGRAM COMPLETE
AR max vel: 1.89 cm2
AV Area VTI: 1.72 cm2
AV Area mean vel: 1.85 cm2
AV Mean grad: 11 mmHg
AV Peak grad: 17.5 mmHg
Ao pk vel: 2.09 m/s
Area-P 1/2: 2.44 cm2
MV VTI: 1.29 cm2
P 1/2 time: 492 msec
S' Lateral: 3.8 cm

## 2021-12-22 NOTE — Patient Instructions (Signed)
Medication Instructions:  Your physician recommends that you continue on your current medications as directed. Please refer to the Current Medication list given to you today.  *If you need a refill on your cardiac medications before your next appointment, please call your pharmacy*   Lab Work: NONE If you have labs (blood work) drawn today and your tests are completely normal, you will receive your results only by: [place] (if you have MyChart) OR A paper copy in the mail If you have any lab test that is abnormal or we need to change your treatment, we will call you to review the results.   Testing/Procedures: Your physician has requested that you have an echocardiogram. Echocardiography is a painless test that uses sound waves to create images of your heart. It provides your doctor with information about the size and shape of your heart and how well your heart's chambers and valves are working. This procedure takes approximately one hour. There are no restrictions for this procedure.    Follow-Up: At [facility], you and your health needs are our priority.  As part of our continuing mission to provide you with exceptional heart care, we have created designated Provider Care Teams.  These Care Teams include your primary Cardiologist (physician) and Advanced Practice Providers (APPs -  Physician Assistants and Nurse Practitioners) who all work together to provide you with the care you need, when you need it.  We recommend signing up for the patient portal called "MyChart".  Sign up information is provided on this After Visit Summary.  MyChart is used to connect with patients for Virtual Visits (Telemedicine).  Patients are able to view lab/test results, encounter notes, upcoming appointments, etc.  Non-urgent messages can be sent to your provider as well.   To learn more about what you can do with MyChart, go to [email or web address].    Your next appointment:   KEEP SCHEDULED  APPOINTMENT   Important Information About [place]

## 2021-12-23 ENCOUNTER — Encounter: Payer: Self-pay | Admitting: Student

## 2021-12-23 ENCOUNTER — Ambulatory Visit (INDEPENDENT_AMBULATORY_CARE_PROVIDER_SITE_OTHER): Payer: Medicare Other | Admitting: Student

## 2021-12-23 VITALS — BP 108/61 | HR 70 | Temp 98.2°F | Ht 66.0 in | Wt 204.5 lb

## 2021-12-23 DIAGNOSIS — G8929 Other chronic pain: Secondary | ICD-10-CM

## 2021-12-23 DIAGNOSIS — M25511 Pain in right shoulder: Secondary | ICD-10-CM | POA: Diagnosis not present

## 2021-12-23 DIAGNOSIS — M5412 Radiculopathy, cervical region: Secondary | ICD-10-CM

## 2021-12-23 MED ORDER — GABAPENTIN 100 MG PO CAPS: 100.0000 mg | ORAL_CAPSULE | Freq: Two times a day (BID) | ORAL | 2 refills | Status: AC

## 2021-12-23 NOTE — Progress Notes (Unsigned)
   CC: Back/neck/shoulder pain  HPI:  [name] is a 76 y.o. male with PMH as below who presents to clinic for evaluation of right neck and shoulder pain. Please see problem based charting for evaluation, assessment and plan.  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 Ali Lowe   Severe mitral regurgitation    Substance abuse ([place])    History of alcohol; hx cocaine, heroin, crack use   Syncope    Tubular adenoma of colon 08/2014    Review of Systems:  Constitutional: Negative for fever or fatigue Eyes: Negative for visual changes Respiratory: Negative for shortness of breath Cardiac: Negative for chest pain MSK: Positive for right neck, shoulder and chronic back pain. Negative for trauma. Abdomen: Negative for abdominal pain, constipation or diarrhea Neuro: Negative for headache, numbness, or weakness.  Positive for tingling down middle finger.  Physical Exam: General: Pleasant, chronically ill elderly male in a wheelchair. No acute distress. Slow transitions due to back pain. Neck: Supple. No C-spine tenderness. Mild neck pain with flexion of the neck. Cardiac: RRR. No murmurs, rubs or gallops. No LE edema Respiratory: Lungs CTAB. No wheezing or  crackles. MSK: No pinpoint tenderness on the right shoulder. Limited ROM of the right shoulder due to pain. RUE neurovascularly intact distally. Negative empty can, Neer's and Hawkins-Kennedy tests. Mild L-spine tenderness. Negative straight leg test. Skin: Warm, dry and intact without rashes or lesions Extremities: Atraumatic. Full ROM. Palpable radial and DP pulses. Neuro: A&O x 3. Strength 5/5 in both upper extremities. Bilateral hands with normal grip strength. Psych: Appropriate mood and affect.  Vitals:   12/23/21 0829  BP: 108/61  Pulse: 70  Temp: 98.2 F (36.8 C)  TempSrc: Oral  SpO2: 96%  Weight: 204 lb 8 oz (92.8 kg)  Height: '5\' 6"'$  (1.676 m)    Assessment & Plan:   See Encounters Tab for problem based charting.  Patient discussed with Dr.  Thomes Cake, MD, MPH

## 2021-12-23 NOTE — Patient Instructions (Signed)
Thank you, Mr.Horrace Roverto Bodmer for allowing [place] to provide your care today. Today we discussed your neck, right arm and low back pain.  For the symptoms, I am referring you to physical therapy. I am also starting you on a medicine to help with the shooting pains down your arm.  Call our office to follow-up if you start developing any weakness or worsening pain.  I have place a referrals to physical and Occupational Therapy  I have ordered the following medication/changed the following medications:  Start gabapentin 100 mg twice daily  My Chart Access: [email or web address]  Please follow-up in 2-3 weeks  Please make sure to arrive 15 minutes prior to your next appointment. If you arrive late, you may be asked to reschedule.    We look forward to seeing you next time. Please call our clinic at [phone] if you have any questions or concerns. The best time to call is Monday-Friday from 9am-4pm, but there is someone available 24/7. If after hours or the weekend, call the main hospital number and ask for the Internal Medicine Resident On-Call. If you need medication refills, please notify your pharmacy one week in advance and they will send [place] a request.   Thank you for letting us take part in your care. Wishing you the best!  [name], MD 12/23/2021, 9:14 AM IM Resident, PGY-2 [facility] Milian 41:10

## 2021-12-24 ENCOUNTER — Other Ambulatory Visit (HOSPITAL_COMMUNITY): Payer: Medicare Other

## 2021-12-24 ENCOUNTER — Encounter: Payer: Self-pay | Admitting: Student

## 2021-12-24 ENCOUNTER — Ambulatory Visit: Payer: Medicare Other

## 2021-12-24 DIAGNOSIS — M5412 Radiculopathy, cervical region: Secondary | ICD-10-CM | POA: Insufficient documentation

## 2021-12-24 NOTE — Assessment & Plan Note (Signed)
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 Patient reports that in the last few days he has noticed some right neck and shoulder pain that is worse at night. The neck pain is intermittent in nature for the shoulder pain is constant. He denies any trauma to his right shoulder or neck. He describes the pain as shooting down his shoulder all the way to his middle finger. He denies any right shoulder weakness or numbness. He has had difficulty sleeping on his right side due to the pain in his shoulder. His chronic low back pain is also limited his mobility. He denies any fevers, chills, headaches, urinary incontinence, bladder incontinence or headaches. On exam, patient has no tenderness to palpation of the C-spine but reports neck pain with flexion of the neck. He also denies any pinpoint tenderness of the right shoulder but has limited range of motion of the shoulder due to pain. Empty can test, Neer's test and Hawkins-Kennedy test are negative but slightly limited by pain.   A/P Differential diagnosis for patient's right shoulder pain and neck pain includes cervical radiculopathy, rotator cuff tendinopathy, impingement syndrome, brachial plexus injury, and arthritis. Cervical radiculopathy highest on the differential due to pain shooting down from neck to middle finger. Patient's years of roofing makes arthritis or impingement syndrome also likely. The lack of RUE weakness makes rotator cuff tear unlikely. Will refer patient to physical therapy and consider imaging if symptoms do not improve. -Referral to OT and PT -Start gabapentin 100 mg twice daily, can titrate up as needed for pain control -Advised to follow-up in clinic if symptoms worsen or he start having RUE weakness

## 2021-12-25 NOTE — Progress Notes (Signed)
Internal Medicine Clinic Attending  Case discussed with Dr. Amponsah  At the time of the visit.  We reviewed the resident's history and exam and pertinent patient test results.  I agree with the assessment, diagnosis, and plan of care documented in the resident's note.  

## 2021-12-26 ENCOUNTER — Telehealth: Payer: Self-pay | Admitting: *Deleted

## 2021-12-26 ENCOUNTER — Inpatient Hospital Stay (HOSPITAL_COMMUNITY)
Admission: EM | Admit: 2021-12-26 | Discharge: 2022-01-05 | DRG: 540 | Disposition: A | Discharge status: Skilled Nursing Facility | Payer: Medicare Other | Attending: Internal Medicine | Admitting: Internal Medicine

## 2021-12-26 ENCOUNTER — Other Ambulatory Visit: Payer: Self-pay

## 2021-12-26 ENCOUNTER — Emergency Department (HOSPITAL_COMMUNITY): Payer: Medicare Other

## 2021-12-26 ENCOUNTER — Encounter (HOSPITAL_COMMUNITY): Payer: Self-pay | Admitting: *Deleted

## 2021-12-26 DIAGNOSIS — F1120 Opioid dependence, uncomplicated: Secondary | ICD-10-CM | POA: Diagnosis present

## 2021-12-26 DIAGNOSIS — I4819 Other persistent atrial fibrillation: Secondary | ICD-10-CM | POA: Diagnosis present

## 2021-12-26 DIAGNOSIS — K219 Gastro-esophageal reflux disease without esophagitis: Secondary | ICD-10-CM | POA: Diagnosis present

## 2021-12-26 DIAGNOSIS — I130 Hypertensive heart and chronic kidney disease with heart failure and stage 1 through stage 4 chronic kidney disease, or unspecified chronic kidney disease: Secondary | ICD-10-CM | POA: Diagnosis present

## 2021-12-26 DIAGNOSIS — I700 Atherosclerosis of aorta: Secondary | ICD-10-CM | POA: Diagnosis present

## 2021-12-26 DIAGNOSIS — Z8249 Family history of ischemic heart disease and other diseases of the circulatory system: Secondary | ICD-10-CM

## 2021-12-26 DIAGNOSIS — I5042 Chronic combined systolic (congestive) and diastolic (congestive) heart failure: Secondary | ICD-10-CM | POA: Diagnosis present

## 2021-12-26 DIAGNOSIS — F1020 Alcohol dependence, uncomplicated: Secondary | ICD-10-CM | POA: Diagnosis present

## 2021-12-26 DIAGNOSIS — Z7951 Long term (current) use of inhaled steroids: Secondary | ICD-10-CM

## 2021-12-26 DIAGNOSIS — E875 Hyperkalemia: Secondary | ICD-10-CM | POA: Diagnosis not present

## 2021-12-26 DIAGNOSIS — R0902 Hypoxemia: Secondary | ICD-10-CM | POA: Diagnosis not present

## 2021-12-26 DIAGNOSIS — N179 Acute kidney failure, unspecified: Secondary | ICD-10-CM | POA: Diagnosis not present

## 2021-12-26 DIAGNOSIS — M869 Osteomyelitis, unspecified: Secondary | ICD-10-CM

## 2021-12-26 DIAGNOSIS — Z833 Family history of diabetes mellitus: Secondary | ICD-10-CM

## 2021-12-26 DIAGNOSIS — M25531 Pain in right wrist: Secondary | ICD-10-CM | POA: Diagnosis not present

## 2021-12-26 DIAGNOSIS — Z789 Other specified health status: Secondary | ICD-10-CM | POA: Diagnosis not present

## 2021-12-26 DIAGNOSIS — D471 Chronic myeloproliferative disease: Secondary | ICD-10-CM | POA: Diagnosis present

## 2021-12-26 DIAGNOSIS — G4733 Obstructive sleep apnea (adult) (pediatric): Secondary | ICD-10-CM | POA: Diagnosis present

## 2021-12-26 DIAGNOSIS — R339 Retention of urine, unspecified: Secondary | ICD-10-CM | POA: Diagnosis not present

## 2021-12-26 DIAGNOSIS — I4892 Unspecified atrial flutter: Secondary | ICD-10-CM | POA: Diagnosis present

## 2021-12-26 DIAGNOSIS — J9601 Acute respiratory failure with hypoxia: Secondary | ICD-10-CM | POA: Diagnosis not present

## 2021-12-26 DIAGNOSIS — Z5982 Transportation insecurity: Secondary | ICD-10-CM

## 2021-12-26 DIAGNOSIS — Z7901 Long term (current) use of anticoagulants: Secondary | ICD-10-CM

## 2021-12-26 DIAGNOSIS — I082 Rheumatic disorders of both aortic and tricuspid valves: Secondary | ICD-10-CM | POA: Diagnosis present

## 2021-12-26 DIAGNOSIS — B999 Unspecified infectious disease: Secondary | ICD-10-CM

## 2021-12-26 DIAGNOSIS — M109 Gout, unspecified: Secondary | ICD-10-CM | POA: Diagnosis present

## 2021-12-26 DIAGNOSIS — R197 Diarrhea, unspecified: Secondary | ICD-10-CM | POA: Diagnosis not present

## 2021-12-26 DIAGNOSIS — J449 Chronic obstructive pulmonary disease, unspecified: Secondary | ICD-10-CM | POA: Diagnosis present

## 2021-12-26 DIAGNOSIS — R52 Pain, unspecified: Secondary | ICD-10-CM | POA: Diagnosis not present

## 2021-12-26 DIAGNOSIS — I2510 Atherosclerotic heart disease of native coronary artery without angina pectoris: Secondary | ICD-10-CM | POA: Diagnosis present

## 2021-12-26 DIAGNOSIS — M5442 Lumbago with sciatica, left side: Secondary | ICD-10-CM | POA: Diagnosis present

## 2021-12-26 DIAGNOSIS — Z95818 Presence of other cardiac implants and grafts: Secondary | ICD-10-CM

## 2021-12-26 DIAGNOSIS — G8929 Other chronic pain: Secondary | ICD-10-CM

## 2021-12-26 DIAGNOSIS — N1832 Chronic kidney disease, stage 3b: Secondary | ICD-10-CM | POA: Diagnosis present

## 2021-12-26 DIAGNOSIS — I739 Peripheral vascular disease, unspecified: Secondary | ICD-10-CM | POA: Diagnosis present

## 2021-12-26 DIAGNOSIS — Z9889 Other specified postprocedural states: Secondary | ICD-10-CM

## 2021-12-26 DIAGNOSIS — J9602 Acute respiratory failure with hypercapnia: Secondary | ICD-10-CM | POA: Diagnosis not present

## 2021-12-26 DIAGNOSIS — Z811 Family history of alcohol abuse and dependence: Secondary | ICD-10-CM

## 2021-12-26 DIAGNOSIS — R0603 Acute respiratory distress: Secondary | ICD-10-CM | POA: Diagnosis not present

## 2021-12-26 DIAGNOSIS — Z79899 Other long term (current) drug therapy: Secondary | ICD-10-CM

## 2021-12-26 DIAGNOSIS — I429 Cardiomyopathy, unspecified: Secondary | ICD-10-CM | POA: Diagnosis present

## 2021-12-26 DIAGNOSIS — M4646 Discitis, unspecified, lumbar region: Principal | ICD-10-CM | POA: Diagnosis present

## 2021-12-26 DIAGNOSIS — I252 Old myocardial infarction: Secondary | ICD-10-CM

## 2021-12-26 DIAGNOSIS — R7989 Other specified abnormal findings of blood chemistry: Secondary | ICD-10-CM | POA: Diagnosis not present

## 2021-12-26 DIAGNOSIS — M5441 Lumbago with sciatica, right side: Secondary | ICD-10-CM | POA: Diagnosis present

## 2021-12-26 DIAGNOSIS — I4891 Unspecified atrial fibrillation: Secondary | ICD-10-CM | POA: Diagnosis present

## 2021-12-26 DIAGNOSIS — J189 Pneumonia, unspecified organism: Secondary | ICD-10-CM | POA: Diagnosis not present

## 2021-12-26 DIAGNOSIS — J690 Pneumonitis due to inhalation of food and vomit: Secondary | ICD-10-CM | POA: Diagnosis not present

## 2021-12-26 DIAGNOSIS — M4626 Osteomyelitis of vertebra, lumbar region: Secondary | ICD-10-CM | POA: Diagnosis present

## 2021-12-26 DIAGNOSIS — A419 Sepsis, unspecified organism: Secondary | ICD-10-CM | POA: Diagnosis not present

## 2021-12-26 DIAGNOSIS — I4811 Longstanding persistent atrial fibrillation: Secondary | ICD-10-CM | POA: Diagnosis not present

## 2021-12-26 DIAGNOSIS — R6521 Severe sepsis with septic shock: Secondary | ICD-10-CM | POA: Diagnosis not present

## 2021-12-26 DIAGNOSIS — M5412 Radiculopathy, cervical region: Secondary | ICD-10-CM

## 2021-12-26 DIAGNOSIS — I5040 Unspecified combined systolic (congestive) and diastolic (congestive) heart failure: Secondary | ICD-10-CM | POA: Diagnosis present

## 2021-12-26 LAB — BASIC METABOLIC PANEL
Anion gap: 11 (ref 5–15)
BUN: 41 mg/dL — ABNORMAL HIGH (ref 8–23)
CO2: 30 mmol/L (ref 22–32)
Calcium: 8.7 mg/dL — ABNORMAL LOW (ref 8.9–10.3)
Chloride: 98 mmol/L (ref 98–111)
Creatinine, Ser: 2.14 mg/dL — ABNORMAL HIGH (ref 0.61–1.24)
GFR, Estimated: 32 mL/min — ABNORMAL LOW (ref >60)
Glucose, Bld: 126 mg/dL — ABNORMAL HIGH (ref 70–99)
Potassium: 4.3 mmol/L (ref 3.5–5.1)
Sodium: 139 mmol/L (ref 135–145)

## 2021-12-26 LAB — I-STAT CHEM 8, ED
BUN: 40 mg/dL — ABNORMAL HIGH (ref 8–23)
Calcium, Ion: 1.03 mmol/L — ABNORMAL LOW (ref 1.15–1.40)
Chloride: 98 mmol/L (ref 98–111)
Creatinine, Ser: 2.4 mg/dL — ABNORMAL HIGH (ref 0.61–1.24)
Glucose, Bld: 127 mg/dL — ABNORMAL HIGH (ref 70–99)
HCT: 49 % (ref 39.0–52.0)
Hemoglobin: 16.7 g/dL (ref 13.0–17.0)
Potassium: 4.2 mmol/L (ref 3.5–5.1)
Sodium: 139 mmol/L (ref 135–145)
TCO2: 32 mmol/L (ref 22–32)

## 2021-12-26 LAB — CBC WITH DIFFERENTIAL/PLATELET
Abs Immature Granulocytes: 0.12 10*3/uL — ABNORMAL HIGH (ref 0.00–0.07)
Basophils Absolute: 0.1 10*3/uL (ref 0.0–0.1)
Basophils Relative: 1 %
Eosinophils Absolute: 0.2 10*3/uL (ref 0.0–0.5)
Eosinophils Relative: 1 %
HCT: 50.7 % (ref 39.0–52.0)
Hemoglobin: 16 g/dL (ref 13.0–17.0)
Immature Granulocytes: 1 %
Lymphocytes Relative: 10 %
Lymphs Abs: 1.3 10*3/uL (ref 0.7–4.0)
MCH: 30.3 pg (ref 26.0–34.0)
MCHC: 31.6 g/dL (ref 30.0–36.0)
MCV: 96 fL (ref 80.0–100.0)
Monocytes Absolute: 1.1 10*3/uL — ABNORMAL HIGH (ref 0.1–1.0)
Monocytes Relative: 9 %
Neutro Abs: 10.6 10*3/uL — ABNORMAL HIGH (ref 1.7–7.7)
Neutrophils Relative %: 78 %
Platelets: 276 10*3/uL (ref 150–400)
RBC: 5.28 MIL/uL (ref 4.22–5.81)
RDW: 19.4 % — ABNORMAL HIGH (ref 11.5–15.5)
WBC: 13.4 10*3/uL — ABNORMAL HIGH (ref 4.0–10.5)
nRBC: 0 % (ref 0.0–0.2)

## 2021-12-26 LAB — LACTIC ACID, PLASMA: Lactic Acid, Venous: 1.5 mmol/L (ref 0.5–1.9)

## 2021-12-26 LAB — SEDIMENTATION RATE: Sed Rate: 40 mm/hr — ABNORMAL HIGH (ref 0–16)

## 2021-12-26 LAB — C-REACTIVE PROTEIN: CRP: 4.5 mg/dL — ABNORMAL HIGH (ref <1.0)

## 2021-12-26 MED ORDER — DIAZEPAM 5 MG PO TABS
5.0000 mg | ORAL_TABLET | Freq: Once | ORAL | Status: AC
  Administered 2021-12-26: 5 mg via ORAL
  Filled 2021-12-26: qty 1

## 2021-12-26 MED ORDER — CYCLOBENZAPRINE HCL 10 MG PO TABS: 10.0000 mg | ORAL_TABLET | Freq: Two times a day (BID) | ORAL | 0 refills | Status: DC | PRN

## 2021-12-26 MED ORDER — IBUPROFEN 800 MG PO TABS
800.0000 mg | ORAL_TABLET | Freq: Once | ORAL | Status: DC
Start: 2021-12-26 — End: 2021-12-26

## 2021-12-26 MED ORDER — HYDROCODONE-ACETAMINOPHEN 5-325 MG PO TABS
1.0000 | ORAL_TABLET | ORAL | Status: DC | PRN
  Administered 2021-12-26 – 2022-01-01 (×11): 2 via ORAL
  Administered 2022-01-01: 1 via ORAL
  Administered 2022-01-02 – 2022-01-04 (×12): 2 via ORAL
  Administered 2022-01-05: 1 via ORAL
  Filled 2021-12-26 (×25): qty 2

## 2021-12-26 MED ORDER — CYCLOBENZAPRINE HCL 10 MG PO TABS
5.0000 mg | ORAL_TABLET | Freq: Once | ORAL | Status: AC
  Administered 2021-12-26: 5 mg via ORAL
  Filled 2021-12-26: qty 1

## 2021-12-26 MED ORDER — ACETAMINOPHEN 325 MG PO TABS
650.0000 mg | ORAL_TABLET | Freq: Four times a day (QID) | ORAL | Status: DC | PRN
  Administered 2021-12-29 – 2021-12-31 (×3): 650 mg via ORAL
  Filled 2021-12-26 (×3): qty 2

## 2021-12-26 MED ORDER — ACETAMINOPHEN 650 MG RE SUPP: 650.0000 mg | Freq: Four times a day (QID) | RECTAL | Status: DC | PRN

## 2021-12-26 MED ORDER — SODIUM CHLORIDE 0.9 % IV SOLN: INTRAVENOUS | Status: DC

## 2021-12-26 MED ORDER — SODIUM CHLORIDE 0.9% FLUSH
3.0000 mL | Freq: Two times a day (BID) | INTRAVENOUS | Status: DC
  Administered 2021-12-26 – 2022-01-05 (×13): 3 mL via INTRAVENOUS

## 2021-12-26 MED ORDER — FENTANYL CITRATE PF 50 MCG/ML IJ SOSY
50.0000 ug | PREFILLED_SYRINGE | Freq: Once | INTRAMUSCULAR | Status: AC
  Administered 2021-12-26: 50 ug via INTRAVENOUS
  Filled 2021-12-26: qty 1

## 2021-12-26 MED ORDER — ACETAMINOPHEN 500 MG PO TABS
1000.0000 mg | ORAL_TABLET | Freq: Once | ORAL | Status: AC
Start: 2021-12-26 — End: 2021-12-26
  Administered 2021-12-26: 1000 mg via ORAL
  Filled 2021-12-26: qty 2

## 2021-12-26 MED ORDER — DICLOFENAC SODIUM 1 % EX GEL: 2.0000 g | Freq: Four times a day (QID) | CUTANEOUS | 0 refills | Status: DC

## 2021-12-26 MED ORDER — PIPERACILLIN-TAZOBACTAM 3.375 G IVPB
3.3750 g | Freq: Three times a day (TID) | INTRAVENOUS | Status: DC
Start: 2021-12-27 — End: 2021-12-27
  Administered 2021-12-27 (×2): 3.375 g via INTRAVENOUS
  Filled 2021-12-26 (×2): qty 50

## 2021-12-26 MED ORDER — VANCOMYCIN HCL 1250 MG/250ML IV SOLN
1250.0000 mg | INTRAVENOUS | Status: DC
Start: 2021-12-26 — End: 2021-12-27
  Administered 2021-12-26: 1250 mg via INTRAVENOUS
  Filled 2021-12-26 (×2): qty 250

## 2021-12-26 MED ORDER — ACETAMINOPHEN 500 MG PO TABS: 1000.0000 mg | ORAL_TABLET | Freq: Four times a day (QID) | ORAL | 0 refills | Status: AC | PRN

## 2021-12-26 MED ORDER — PIPERACILLIN-TAZOBACTAM 3.375 G IVPB 30 MIN
3.3750 g | Freq: Once | INTRAVENOUS | Status: AC
  Administered 2021-12-26: 3.375 g via INTRAVENOUS
  Filled 2021-12-26: qty 50

## 2021-12-26 NOTE — ED Notes (Signed)
Patients significant other Harolyn Rutherford said to call her to pick him [phone]

## 2021-12-26 NOTE — ED Notes (Signed)
Patient reports pain is too great to ambulate. PA aware.

## 2021-12-26 NOTE — ED Provider Notes (Cosign Needed Addendum)
[facility] DEPT Provider Note   CSN: [id number] Arrival date & time: 12/26/21  1252     History  Chief Complaint  Patient presents with   Back Pain    [name] is a 76 y.o. male.  The history is provided by the patient and medical records. No language interpreter was used.  Back Pain   76 year old male with significant history of chronic back pain, gout, A-fib on Eliquis, GERD, COPD, CHF, presenting with complaint of abdominal pain.  Patient was brought here via EMS.  Patient report he has had recurrent pain to his lower back radiates down to both legs ongoing for more than a month.  Denies any specific treatment tried at home but states his doctor was prescribing gabapentin in the past without relief.  He is having more trouble with the pain worse with movement.  He does not endorse any recent injury.  Denies fever or chills no bowel bladder incontinence or saddle anesthesia no rash.  No new changes.  Home Medications Prior to Admission medications   Medication Sig Start Date End Date Taking? Authorizing Provider  acetaminophen (TYLENOL) 500 MG tablet Take 1,000 mg by mouth every 6 (six) hours as needed for mild pain.    [provider]  albuterol (PROAIR HFA) 108 (90 Base) MCG/ACT inhaler Inhale 1-2 puffs into the lungs every 6 (six) hours as needed for wheezing or shortness of breath. 01/11/19   [name], MD  amiodarone (PACERONE) 200 MG tablet Take 1 tablet (200 mg total) by mouth daily. 10/23/21   Larey Dresser, MD  apixaban (ELIQUIS) 5 MG TABS tablet Take 1 tablet (5 mg total) by mouth 2 (two) times daily. 04/26/21   Jose Persia, MD  atorvastatin (LIPITOR) 80 MG tablet Take 1 tablet (80 mg total) by mouth daily. 04/26/21   Jose Persia, MD  camphor-menthol [facility]) lotion Apply topically 3 (three) times daily. 02/13/19   Kayleen Memos, DO  dapagliflozin propanediol (FARXIGA) 10 MG TABS tablet Take 1 tablet (10 mg  total) by mouth daily before breakfast. 09/09/21   [name], PA-C  diclofenac Sodium (VOLTAREN) 1 % GEL Apply 2 g topically 4 (four) times daily as needed (as needed for pain). 08/31/21   [name], MD  gabapentin (NEURONTIN) 100 MG capsule Take 1 capsule (100 mg total) by mouth 2 (two) times daily. 12/23/21   [name], MD  hydrocortisone cream 1 % Apply 1 application topically 2 (two) times daily. 09/04/21   [name], MD  metoprolol succinate (TOPROL-XL) 25 MG 24 hr tablet Take 0.5 tablets (12.5 mg total) by mouth daily. 11/21/21   Kathyrn Drown D, NP  potassium chloride SA (KLOR-CON M) 20 MEQ tablet Take 1 tablet (20 mEq total) by mouth daily. 11/21/21   [name], MD  senna-docusate (SENOKOT-S) 8.6-50 MG tablet Take 1 tablet by mouth at bedtime as needed for mild constipation. 08/31/21   [name], MD  spironolactone (ALDACTONE) 25 MG tablet Take 0.5 tablets (12.5 mg total) by mouth daily. 10/23/21   Larey Dresser, MD  SUBOXONE 8-2 MG FILM Place 2 Film under the tongue daily. 08/21/15   [provider]  SYMBICORT 160-4.5 MCG/ACT inhaler Inhale 2 puffs into the lungs daily. 07/02/21   [provider]  torsemide (DEMADEX) 20 MG tablet Take 3 tablets (60 mg total) by mouth 2 (two) times daily. 10/23/21   Larey Dresser, MD  Allergies    Patient has no known allergies.    Review of Systems   Review of Systems  Musculoskeletal:  Positive for back pain.  All other systems reviewed and are negative.   Physical Exam Updated Vital Signs BP 132/67 (BP Location: Left Arm)   Pulse 68   Temp 98 F (36.7 C) (Oral)   Resp 18   Ht '5\' 6"'$  (1.676 m)   Wt 92.1 kg   SpO2 94%   BMI 32.77 kg/m  Physical Exam Vitals and nursing note reviewed.  Constitutional:      General: He is not in acute distress.    Appearance: He is well-developed.  HENT:     Head: Atraumatic.  Eyes:     Conjunctiva/sclera: Conjunctivae normal.  Abdominal:      Palpations: Abdomen is soft.     Tenderness: There is no abdominal tenderness.  Musculoskeletal:        General: Tenderness (Tenderness along lumbar spine with positive straight leg raise bilaterally to lower extremities.  No overlying skin changes no step-off or crepitus.) present.     Cervical back: Neck supple.  Skin:    Capillary Refill: Capillary refill takes less than 2 seconds.     Findings: No rash.  Neurological:     Mental Status: He is alert. Mental status is at baseline.     Deep Tendon Reflexes: Reflexes normal.     ED Results / Procedures / Treatments   Labs (all labs ordered are listed, but only abnormal results are displayed) Labs Reviewed  BASIC METABOLIC PANEL - Abnormal; Notable for the following components:      Result Value   Glucose, Bld 126 (*)    BUN 41 (*)    Creatinine, Ser 2.14 (*)    Calcium 8.7 (*)    GFR, Estimated 32 (*)    All other components within normal limits  CBC WITH DIFFERENTIAL/PLATELET - Abnormal; Notable for the following components:   WBC 13.4 (*)    RDW 19.4 (*)    Neutro Abs 10.6 (*)    Monocytes Absolute 1.1 (*)    Abs Immature Granulocytes 0.12 (*)    All other components within normal limits  SEDIMENTATION RATE - Abnormal; Notable for the following components:   Sed Rate 40 (*)    All other components within normal limits  I-STAT CHEM 8, ED - Abnormal; Notable for the following components:   BUN 40 (*)    Creatinine, Ser 2.40 (*)    Glucose, Bld 127 (*)    Calcium, Ion 1.03 (*)    All other components within normal limits  CULTURE, BLOOD (ROUTINE X 2)  CULTURE, BLOOD (ROUTINE X 2)  MRSA NEXT GEN BY PCR, NASAL  C-REACTIVE PROTEIN  PROCALCITONIN  LACTIC ACID, PLASMA  LACTIC ACID, PLASMA  COMPREHENSIVE METABOLIC PANEL  CBC  PROTIME-INR    EKG None  Radiology MR LUMBAR SPINE WO CONTRAST  Result Date: 12/26/2021 CLINICAL DATA:  Lower back pain. Symptoms persist with 6 weeks treatment. Bilateral leg pain. EXAM: MRI  LUMBAR SPINE WITHOUT CONTRAST TECHNIQUE: Multiplanar, multisequence MR imaging of the lumbar spine was performed. No intravenous contrast was administered. COMPARISON:  Lumbar spine radiographs 12/26/2021 FINDINGS: Segmentation: There is transitional lumbosacral anatomy. Correlating with prior 09/29/2021 CT there appear to be 12 rib-bearing thoracic type vertebral bodies. The next vertebral body is transitional with segmented transverse processes but no ribs. This is considered T13. Distal to this, the next 5 vertebral bodies are considered L1  through L5. Axial series 8 image 9 corresponds to the T13-L1 disc level, image 16 corresponds to the L1-2 disc level, and image 37 corresponds to the L5-S1 disc level. There is a transitional right L5-S1 assimilation joint with associated partially visualized degenerative change and marrow edema (sagittal series 7, image 1/17 and axial image 35/41). Alignment:  2 mm retrolisthesis of L1 on L2. Vertebrae: Vertebral body heights are maintained. Severe right-greater-than-left L3-4 diffuse severe L4-5, and moderate right-greater-than-left L2-3 and mild right-greater-than-left L1-2 disc space narrowing. Decreased disc hydration is greatest at L4-5. There is mild-to-moderate endplate irregularity of the right-greater-than-left L3-4 disc space with moderate marrow edema and mild chronic fat intensity marrow endplate degenerative changes at the right L3-4 disc space. There is mild fluid throughout the L3-4 disc space raising concern for discitis/osteomyelitis. Conus medullaris and cauda equina: Conus extends to the mid to inferior T12 level. Conus and cauda equina appear normal. Paraspinal and other soft tissues: At the area of the right L3-4 disc space concerning for discitis/osteomyelitis there is mild edema within the adjacent right psoas muscle (axial series 8 image 28 and sagittal series 7, image 2) further raising suspicion for infected disc. The kidneys are partially  visualized. There are multiple small decreased T1 and increased T2 signal cyst within the upper pole of the right kidney. Disc levels: T11-12 and T12-L1: Unremarkable. L1-2: Mild-to-moderate bilateral facet joint hypertrophy. Mild broad-based posterior disc osteophyte complex. Mild-to-moderate bilateral neuroforaminal stenosis. No central canal stenosis. L2-3: Mild-to-moderate bilateral facet joint hypertrophy. Mild broad-based posterior disc osteophyte complex. Mild narrowing of the bilateral neural foramina without true stenosis. No central canal stenosis. L3-4: Mild-to-moderate bilateral facet joint hypertrophy. Moderate broad-based posterior disc osteophyte complex with bilateral intraforaminal extension. Moderate right-greater-than-left neuroforaminal stenosis. Moderate narrowing of the lateral recesses. Prominent epidural fat around the thecal sac. Mild-to-moderate central canal stenosis. L4-5: Moderate left and mild right facet joint hypertrophy. Mild-to-moderate broad-based posterior disc osteophyte complex with left greater than right intraforaminal extension. Moderate to severe left and mild-to-moderate right neuroforaminal stenosis. Mild narrowing of the lateral recesses. Prominent epidural fat around the thecal sac. Overall mild left-greater-than-right central canal stenosis. L5-S1: Moderate left and mild right facet joint hypertrophy. Mild broad-based posterior disc bulge. Mild left-greater-than-right neuroforaminal stenosis. No central canal stenosis. IMPRESSION: 1. Transitional lumbosacral anatomy. There are considered to be 13 thoracic vertebral bodies with segmental transverse processes but no ribs at "T13." Recommend correlation with this numbering nomenclature prior to any image guided invasive procedure. 2. Findings are suspicious for right-greater-than-left L3-4 discitis/osteomyelitis including right-greater-than-left endplate marrow edema, endplate irregularity, and fluid within the disc space.  No definite paraspinal or epidural abscess is seen. There does appear to be inflammatory edema within the adjacent right psoas muscle. 3. Multilevel degenerative disc and joint changes as above. Mild-to-moderate L3-4 and mild left-greater-than-right L4-5 central canal stenosis. 4. Multilevel neuroforaminal stenosis including moderate right-greater-than-left L3-4 and moderate to severe left and mild-to-moderate right L4-5 neuroforaminal stenosis. Critical Value/emergent results were called by telephone at the time of interpretation on 12/26/2021 at 5:58 pm to provider [facility] , who verbally acknowledged these results. Electronically Signed   By: Yvonne Kendall M.D.   On: 12/26/2021 17:58   DG Lumbar Spine Complete  Result Date: 12/26/2021 CLINICAL DATA:  Back pain for 1 week, no known injury EXAM: LUMBAR SPINE - COMPLETE 4+ VIEW COMPARISON:  None Available. FINDINGS: No fracture or dislocation of the lumbar spine. Moderate to severe disc space height loss and osteophytosis of the lower lumbar levels,  worst from L3 through S1. Otherwise mild disc space height loss and osteophytosis. Moderate to severe facet degenerative change at the lower lumbar levels. Nonobstructive pattern of overlying bowel gas. IMPRESSION: 1.  No fracture or dislocation of the lumbar spine. 2. Moderate to severe disc and facet degenerative disease of the lower lumbar levels, worst from L3 through S1. Lumbar disc and neural foraminal pathology may be further evaluated by MRI if indicated by neurologically localizing signs and symptoms. Electronically Signed   By: Delanna Ahmadi M.D.   On: 12/26/2021 14:20    Procedures .Critical Care  Performed by: [name], PA-C Authorized by: [name], PA-C   Critical care provider statement:    Critical care time (minutes):  30   Critical care was time spent personally by me on the following activities:  Development of treatment plan with patient or surrogate, discussions with consultants,  evaluation of patient's response to treatment, examination of patient, ordering and review of laboratory studies, ordering and review of radiographic studies, ordering and performing treatments and interventions, pulse oximetry, re-evaluation of patient's condition and review of old charts     Medications Ordered in ED Medications  cyclobenzaprine (FLEXERIL) tablet 5 mg (5 mg Oral Given 12/26/21 1333)  acetaminophen (TYLENOL) tablet 1,000 mg (1,000 mg Oral Given 12/26/21 1333)  diazepam (VALIUM) tablet 5 mg (5 mg Oral Given 12/26/21 1515)  fentaNYL (SUBLIMAZE) injection 50 mcg (50 mcg Intravenous Given 12/26/21 1841)    ED Course/ Medical Decision Making/ A&P                           Medical Decision Making Amount and/or Complexity of Data Reviewed Labs: ordered. Radiology: ordered.  Risk OTC drugs. Prescription drug management. Decision regarding hospitalization.   BP 132/67 (BP Location: Left Arm)   Pulse 68   Temp 98 F (36.7 C) (Oral)   Resp 18   Ht '5\' 6"'$  (1.676 m)   Wt 92.1 kg   SpO2 94%   BMI 32.77 kg/m   1:14 PM This is an elderly male with history of chronic back pain and multiple other comorbidity who presents with acute on chronic lower back pain.  His back pain has been an ongoing issue for more than a month but worsened today.  No other inciting factor.  Pain worse with movement.  No red flags.  In the room, patient is eating chips appears to be in no acute discomfort.  He does have reproducible tenderness along his lumbar spine at the level of L1-L5 without any crepitus or step-off and no skin changes.  He does have pain with leg raise bilaterally but it seems that any movement is bothersome to him.  He is able to stand.  He is neurovascular intact.  I have low suspicion for fracture or dislocation given the prolonged duration of his symptoms.  I felt patient will be best treated with symptomatic treatment which includes Tylenol and muscle relaxant.  He is afebrile, vital  signs stable, I have low suspicion for infectious pathology such as cellulitis, discitis, or septic joint.  Doubt osteomyelitis.  I have very low suspicion for cauda equina syndrome.  After further consideration will discharge home with symptomatic treatment.  I do not think advanced imaging is indicated at this time.  3:08 PM Unfortunately pt unable to ambulate due to pain.  Additional pain medication given.  Xray of Lspine obtained, independently visualized and interpreted by me and I agree with  radiologist's interpretation.  Pt has moderate to severe disc and facet degenerative changes.  No acute fx or dislocation.  Since he is unable to bear weight due to pain, will obtain MRI of Lspine for further assessment.  Care discussed with Dr. Sherry Ruffing.   6:00 PM Labs and imaging independently viewed interpreted by me and I agree with radiologist interpretation.  Labs remarkable for elevated white count of 13.4.  Patient with evidence of chronic kidney disease with a creatinine of 2.4 similar to prior.  MRI of the lumbar spine shows some concerning finding which includes right greater than left endplate marrow edema with endplate irregularity and fluid within the disc space concerning for discitis/osteomyelitis at the level of L3-4.  No definitive paraspinal or epidural abscess seen.  Inflammatory edema noted to the right psoas muscle  I discussed this finding with the patient and felt the patient would need to be admitted for pain control as well as be evaluated by neurosurgeon for possible discitis/osteomyelitis.  He does have an elevated white count which is concerning.  He admits to using IV drug use in the past but it has been more than 10 years.  He has been using cane and walker to help ambulate but having increasing trouble with that.  He still requesting for additional medication to help with his pain.  He does not have any complaints of bowel bladder incontinence or saddle anesthesia.  No fever.  6:37  PM Appreciate consultation from neurosurgeon Dr. Irven Baltimore PA Jinny Blossom, who request for medicine to admit patient, IR will need to perform aspiration of the psoas fluid collection and infectious disease may need to be involved for further treatment.  Neurosurgeon does not think Patient need any emergent surgical intervention at this time.  However, they will be available for consultation.  8:07 PM I was able to consult [facility] Dr. Marcello Moores who agrees to admit pt for further care.  Pt is made aware of plan and agrees with plan   This patient presents to the ED for concern of back pain, this involves an extensive number of treatment options, and is a complaint that carries with it a high risk of complications and morbidity.  The differential diagnosis includes spinal stenosis, degenerative disc disease, sciatica, lumbar strain, osteomeyelitis, discitis, caudal equina, kidney stone, aortic dissection, shingle  Co morbidities that complicate the patient evaluation Remote hx of IVDU  Chronic back pain   Hepatitis   afib Additional history obtained:  Additional history obtained from EMS External records from outside source obtained and reviewed including notes from internal mecidine and cardiology  Lab Tests:  I Ordered, and personally interpreted labs.  The pertinent results include:  as above  Imaging Studies ordered:  I ordered imaging studies including lumbar MRI I independently visualized and interpreted imaging which showed finding concerning for discitis/osteomyelitis I agree with the radiologist interpretation  Cardiac Monitoring:  The patient was maintained on a cardiac monitor.  I personally viewed and interpreted the cardiac monitored which showed an underlying rhythm of: irregularly irregular  Medicines ordered and prescription drug management:  I ordered medication including fentanyl  for back pain Reevaluation of the patient after these medicines showed that the patient  improved I have reviewed the patients home medicines and have made adjustments as needed  Test Considered: as above  Critical Interventions: opiate pain medication  Muscle relaxant  Consultations Obtained:  I requested consultation with the neurosurgery Dr. Annette Stable,  and discussed lab and imaging findings as well as  pertinent plan - they recommend: hospital admission, IR to drain fluid near psoas muscle, and ID consult for abx recommendation  Problem List / ED Course: back pain  Possible discitis/osteomyelitis  Reevaluation:  After the interventions noted above, I reevaluated the patient and found that they have :improved  Social Determinants of Health: remote hx of IVDU  Lack of transportation  Dispostion:  After consideration of the diagnostic results and the patients response to treatment, I feel that the patent would benefit from admission.         Final Clinical Impression(s) / ED Diagnoses Final diagnoses:  Discitis of lumbar region  Chronic bilateral low back pain with bilateral sciatica    Rx / DC Orders ED Discharge Orders          Ordered    acetaminophen (TYLENOL) 500 MG tablet  Every 6 hours PRN        12/26/21 1317    cyclobenzaprine (FLEXERIL) 10 MG tablet  2 times daily PRN        12/26/21 1317    diclofenac Sodium (VOLTAREN) 1 % GEL  4 times daily        12/26/21 1317              [name], PA-C 12/26/21 1319    Tegeler, Gwenyth Allegra, MD 12/26/21 1348    [name], PA-C 12/26/21 2201    Tegeler, Gwenyth Allegra, MD 12/27/21 [phone]

## 2021-12-26 NOTE — ED Notes (Signed)
Patient transferred to stretcher for comfort.

## 2021-12-26 NOTE — ED Notes (Signed)
Patient transported to MRI 

## 2021-12-26 NOTE — ED Triage Notes (Signed)
Pt arrives via [facility] from home c/o lower back pain ongoing for greater than one month. Pt denies injury/trauma to the area. Pt states that he was recently seen at PCP for same and prescribed gabapentin without relief. Pt endorses difficulty with ADLs due to pain.

## 2021-12-26 NOTE — Progress Notes (Addendum)
Pharmacy Antibiotic Note  [name] is a 76 y.o. male admitted on 12/26/2021 with back pain.  MRI of the lumbar spine shows some concerning finding which includes right greater than left endplate marrow edema with endplate irregularity and fluid within the disc space concerning for discitis/osteomyelitis at the level of L3-4.  Pharmacy has been consulted for Zosyn and vancomycin dosing for discitis.  Plan: Zosyn 3.375 g IV x 1 over 30 minutes then Zosyn 3.375 g IV (infused over 4 hours) every 8 hours Vancomycin 1250 mg IV every 48 hours (Goal AUC 400-550. Expected AUC: 551.1.  SCr used: 2.4) Monitor clinical progress, renal function, vancomycin levels as indicated F/U C&S, abx deescalation / LOT, neurosurgery recommendations, IR findings, and ID plans     Height: '5\' 6"'$  (167.6 cm) Weight: 92.1 kg (203 lb) IBW/kg (Calculated) : 63.8  Temp (24hrs), Avg:98 F (36.7 C), Min:98 F (36.7 C), Max:98 F (36.7 C)  Recent Labs  Lab 12/26/21 1449 12/26/21 1512  WBC  --  13.4*  CREATININE 2.14* 2.40*    Estimated Creatinine Clearance: 28.7 mL/min (A) (by C-G formula based on SCr of 2.4 mg/dL (H)).    No Known Allergies  Antimicrobials this admission: 6/9 Zosyn >>  6/9 vancomycin >>    Microbiology results: 6/9 BCx: ordered   Thank you for allowing pharmacy to be a part of this patient's care.  Royetta Asal, PharmD, BCPS Clinical Pharmacist [place] Please utilize Amion for appropriate phone number to reach the unit pharmacist ([place]) 12/26/2021 8:56 PM

## 2021-12-26 NOTE — ED Notes (Signed)
With patient permission, update provided to significant other, [name]. She requested to speak with patient. Assisted patient with calling [name].

## 2021-12-26 NOTE — H&P (Incomplete)
History and Physical    [name] FXT:[id number] DOB: 1946/06/11 DOA: 12/26/2021  PCP: Marianna Payment, MD  Patient coming from:  home  I have personally briefly reviewed patient's old medical records in [place]  Chief Complaint: back pain , decrease ability to ambulate   HPI: [name] is a 76 y.o. male with medical history significant of    ED Course: ***  Review of Systems: As per HPI otherwise 10 point review of systems negative.   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 Ali Lowe   Severe mitral regurgitation    Substance abuse ([place])    History of alcohol; hx cocaine, heroin, crack use   Syncope    Tubular adenoma of colon 08/2014    Past Surgical History:  Procedure Laterality Date   [place] AND ABLATION  08/2010   [name] 09/07/2010 (12/14/2012)   CARDIAC SURGERY     CARDIOVERSION N/A 10/16/2021   Procedure: CARDIOVERSION;  Surgeon: Larey Dresser, MD;  Location: [facility];  Service: Cardiovascular;  Laterality: N/A;   COLONOSCOPY     15-20 years ago had colon in [place] N/A 02/12/2020   Procedure: COLONOSCOPY WITH PROPOFOL;  Surgeon: Ladene Artist, MD;  Location: [facility];  Service: Endoscopy;  Laterality: N/A;   CORONARY STENT INTERVENTION N/A 10/10/2020   Procedure: CORONARY STENT INTERVENTION;  Surgeon: Lorretta Harp, MD;  Location: [place] [facility] LAB;  Service: Cardiovascular;  Laterality: N/A;   EXCISIONAL HEMORRHOIDECTOMY  1970's   LOOP RECORDER IMPLANT N/A 08/23/2013   Procedure: LOOP RECORDER IMPLANT;  Surgeon: Deboraha Sprang, MD;  Location: [facility] CATH LAB;  Service: Cardiovascular;  Laterality: N/A;   MITRAL VALVE REPAIR N/A 11/20/2021   Procedure: MITRAL VALVE REPAIR;  Surgeon: [name], MD;  Location: [place] CV LAB;  Service: Cardiovascular;  Laterality: N/A;   MULTIPLE EXTRACTIONS WITH ALVEOLOPLASTY Bilateral 01/24/2016   Procedure: MULTIPLE EXTRACTION WITH ALVEOLOPLASTY BILATERAL;  Surgeon: Diona Browner, DDS;  Location: [place];  Service: Oral Surgery;  Laterality: Bilateral;   MULTIPLE TOOTH EXTRACTIONS  01/24/2016   MULTIPLE EXTRACTION WITH ALVEOLOPLASTY BILATERAL (Bilateral)   POLYPECTOMY  02/12/2020   Procedure: POLYPECTOMY;  Surgeon: Ladene Artist, MD;  Location: [facility];  Service: Endoscopy;;   RIGHT HEART CATH N/A 10/03/2021   Procedure: RIGHT HEART CATH;  Surgeon: [name], MD;  Location: [place] CV LAB;  Service: Cardiovascular;  Laterality: N/A;   RIGHT/LEFT HEART CATH AND CORONARY ANGIOGRAPHY N/A 10/09/2020   Procedure: RIGHT/LEFT HEART CATH AND CORONARY ANGIOGRAPHY;  Surgeon: Burnell Blanks,  MD;  Location: [place] CV LAB;  Service: Cardiovascular;  Laterality: N/A;   TEE WITHOUT CARDIOVERSION N/A 10/16/2021   Procedure: TRANSESOPHAGEAL ECHOCARDIOGRAM (TEE);  Surgeon: Larey Dresser, MD;  Location: [facility] ENDOSCOPY;  Service: Cardiovascular;  Laterality: N/A;   TEE WITHOUT CARDIOVERSION N/A 11/07/2021   Procedure: TRANSESOPHAGEAL ECHOCARDIOGRAM (TEE);  Surgeon: Werner Lean, MD;  Location: [facility] ENDOSCOPY;  Service: Cardiovascular;  Laterality: N/A;    TEE WITHOUT CARDIOVERSION N/A 11/20/2021   Procedure: TRANSESOPHAGEAL ECHOCARDIOGRAM (TEE);  Surgeon: [name], MD;  Location: [place] CV LAB;  Service: Cardiovascular;  Laterality: N/A;     reports that he quit smoking about 4 weeks ago. His smoking use included cigarettes. He has a 100.00 pack-year smoking history. He has never used smokeless tobacco. He reports current alcohol use of about 2.0 standard drinks of alcohol per week. He reports that he does not currently use drugs after having used the following drugs: Heroin.  No Known Allergies  Family History  Problem Relation Age of Onset   Diabetes Mother    Hypertension Mother    Cirrhosis Father    Alcohol abuse Father    Colon cancer Neg Hx    Rectal cancer Neg Hx    Stomach cancer Neg Hx    Esophageal cancer Neg Hx    Colon polyps Neg Hx    *** Prior to Admission medications   Medication Sig Start Date End Date Taking? Authorizing Provider  acetaminophen (TYLENOL) 500 MG tablet Take 2 tablets (1,000 mg total) by mouth every 6 (six) hours as needed for moderate pain. 12/26/21  Yes [name], PA-C  cyclobenzaprine (FLEXERIL) 10 MG tablet Take 1 tablet (10 mg total) by mouth 2 (two) times daily as needed for muscle spasms. 12/26/21  Yes [name], PA-C  diclofenac Sodium (VOLTAREN) 1 % GEL Apply 2 g topically 4 (four) times daily. Apply 2g topically to lower back 4 times daily as needed for back pain 12/26/21  Yes [name], PA-C  albuterol [facility] HFA) [phone] Base) MCG/ACT inhaler Inhale 1-2 puffs into the lungs every 6 (six) hours as needed for wheezing or shortness of breath. 01/11/19   [name], MD  amiodarone (PACERONE) 200 MG tablet Take 1 tablet (200 mg total) by mouth daily. 10/23/21   Larey Dresser, MD  apixaban (ELIQUIS) 5 MG TABS tablet Take 1 tablet (5 mg total) by mouth 2 (two) times daily. 04/26/21   Jose Persia, MD  atorvastatin (LIPITOR) 80 MG tablet Take 1 tablet (80 mg total) by mouth daily.  04/26/21   Jose Persia, MD  camphor-menthol [facility]) lotion Apply topically 3 (three) times daily. 02/13/19   Kayleen Memos, DO  dapagliflozin propanediol (FARXIGA) 10 MG TABS tablet Take 1 tablet (10 mg total) by mouth daily before breakfast. 09/09/21   [name], PA-C  gabapentin (NEURONTIN) 100 MG capsule Take 1 capsule (100 mg total) by mouth 2 (two) times daily. 12/23/21   [name], MD  hydrocortisone cream 1 % Apply 1 application topically 2 (two) times daily. 09/04/21   [name], MD  metoprolol succinate (TOPROL-XL) 25 MG 24 hr tablet Take 0.5 tablets (12.5 mg total) by mouth daily. 11/21/21   Kathyrn Drown D, NP  potassium chloride SA (KLOR-CON M) 20 MEQ tablet Take 1 tablet (20 mEq total) by mouth daily. 11/21/21   [name], MD  senna-docusate (SENOKOT-S) 8.6-50 MG tablet Take 1 tablet by mouth at bedtime as needed  for mild constipation. 08/31/21   [name], MD  spironolactone (ALDACTONE) 25 MG tablet Take 0.5 tablets (12.5 mg total) by mouth daily. 10/23/21   Larey Dresser, MD  SUBOXONE 8-2 MG FILM Place 2 Film under the tongue daily. 08/21/15   [provider]  SYMBICORT 160-4.5 MCG/ACT inhaler Inhale 2 puffs into the lungs daily. 07/02/21   [provider]  torsemide (DEMADEX) 20 MG tablet Take 3 tablets (60 mg total) by mouth 2 (two) times daily. 10/23/21   Larey Dresser, MD    Physical Exam: Vitals:   12/26/21 1900 12/26/21 1915 12/26/21 1930 12/26/21 1945  BP: 107/82 (!) 95/56 106/82 (!) 104/50  Pulse: 62 69 63 63  Resp:  17    Temp:      TempSrc:      SpO2: 93% 92% 96% 93%  Weight:      Height:        Constitutional: NAD, calm, comfortable Vitals:   12/26/21 1900 12/26/21 1915 12/26/21 1930 12/26/21 1945  BP: 107/82 (!) 95/56 106/82 (!) 104/50  Pulse: 62 69 63 63  Resp:  17    Temp:      TempSrc:      SpO2: 93% 92% 96% 93%  Weight:      Height:       Eyes: PERRL, lids and conjunctivae normal ENMT: Mucous  membranes are moist. Posterior pharynx clear of any exudate or lesions.Normal dentition.  Neck: normal, supple, no masses, no thyromegaly Respiratory: clear to auscultation bilaterally, no wheezing, no crackles. Normal respiratory effort. No accessory muscle use.  Cardiovascular: Regular rate and rhythm, no murmurs / rubs / gallops. No extremity edema. 2+ pedal pulses. No carotid bruits.  Abdomen: no tenderness, no masses palpated. No hepatosplenomegaly. Bowel sounds positive.  Musculoskeletal: no clubbing / cyanosis. No joint deformity upper and lower extremities. Good ROM, no contractures. Normal muscle tone.  Skin: no rashes, lesions, ulcers. No induration Neurologic: CN 2-12 grossly intact. Sensation intact, DTR normal. Strength 5/5 in all 4.  Psychiatric: Normal judgment and insight. Alert and oriented x 3. Normal mood.    Labs on Admission: I have personally reviewed following labs and imaging studies  CBC: Recent Labs  Lab 12/26/21 1512  WBC 13.4*  NEUTROABS 10.6*  HGB 16.0  16.7  HCT 50.7  49.0  MCV 96.0  PLT 193   Basic Metabolic Panel: Recent Labs  Lab 12/26/21 1449 12/26/21 1512  NA 139 139  K 4.3 4.2  CL 98 98  CO2 30  --   GLUCOSE 126* 127*  BUN 41* 40*  CREATININE 2.14* 2.40*  CALCIUM 8.7*  --    GFR: Estimated Creatinine Clearance: 28.7 mL/min (A) (by C-G formula based on SCr of 2.4 mg/dL (H)). Liver Function Tests: No results for input(s): "AST", "ALT", "ALKPHOS", "BILITOT", "PROT", "ALBUMIN" in the last 168 hours. No results for input(s): "LIPASE", "AMYLASE" in the last 168 hours. No results for input(s): "AMMONIA" in the last 168 hours. Coagulation Profile: No results for input(s): "INR", "PROTIME" in the last 168 hours. Cardiac Enzymes: No results for input(s): "CKTOTAL", "CKMB", "CKMBINDEX", "TROPONINI" in the last 168 hours. BNP (last 3 results) No results for input(s): "PROBNP" in the last 8760 hours. HbA1C: No results for input(s): "HGBA1C"  in the last 72 hours. CBG: No results for input(s): "GLUCAP" in the last 168 hours. Lipid Profile: No results for input(s): "CHOL", "HDL", "LDLCALC", "TRIG", "CHOLHDL", "LDLDIRECT" in the last 72 hours. Thyroid Function Tests: No results  for input(s): "TSH", "T4TOTAL", "FREET4", "T3FREE", "THYROIDAB" in the last 72 hours. Anemia Panel: No results for input(s): "VITAMINB12", "FOLATE", "FERRITIN", "TIBC", "IRON", "RETICCTPCT" in the last 72 hours. Urine analysis:    Component Value Date/Time   COLORURINE YELLOW 11/18/2021 [place] 11/18/2021 1143   LABSPEC 1.011 11/18/2021 1143   PHURINE 5.0 11/18/2021 1143   GLUCOSEU 150 (A) 11/18/2021 1143   HGBUR NEGATIVE 11/18/2021 1143   [place] 11/18/2021 1143   KETONESUR NEGATIVE 11/18/2021 1143   PROTEINUR NEGATIVE 11/18/2021 1143   UROBILINOGEN 0.2 12/10/2014 0948   NITRITE NEGATIVE 11/18/2021 1143   LEUKOCYTESUR NEGATIVE 11/18/2021 1143    Radiological Exams on Admission: MR LUMBAR SPINE WO CONTRAST  Result Date: 12/26/2021 CLINICAL DATA:  Lower back pain. Symptoms persist with 6 weeks treatment. Bilateral leg pain. EXAM: MRI LUMBAR SPINE WITHOUT CONTRAST TECHNIQUE: Multiplanar, multisequence MR imaging of the lumbar spine was performed. No intravenous contrast was administered. COMPARISON:  Lumbar spine radiographs 12/26/2021 FINDINGS: Segmentation: There is transitional lumbosacral anatomy. Correlating with prior 09/29/2021 CT there appear to be 12 rib-bearing thoracic type vertebral bodies. The next vertebral body is transitional with segmented transverse processes but no ribs. This is considered T13. Distal to this, the next 5 vertebral bodies are considered L1 through L5. Axial series 8 image 9 corresponds to the T13-L1 disc level, image 16 corresponds to the L1-2 disc level, and image 37 corresponds to the L5-S1 disc level. There is a transitional right L5-S1 assimilation joint with associated partially  visualized degenerative change and marrow edema (sagittal series 7, image 1/17 and axial image 35/41). Alignment:  2 mm retrolisthesis of L1 on L2. Vertebrae: Vertebral body heights are maintained. Severe right-greater-than-left L3-4 diffuse severe L4-5, and moderate right-greater-than-left L2-3 and mild right-greater-than-left L1-2 disc space narrowing. Decreased disc hydration is greatest at L4-5. There is mild-to-moderate endplate irregularity of the right-greater-than-left L3-4 disc space with moderate marrow edema and mild chronic fat intensity marrow endplate degenerative changes at the right L3-4 disc space. There is mild fluid throughout the L3-4 disc space raising concern for discitis/osteomyelitis. Conus medullaris and cauda equina: Conus extends to the mid to inferior T12 level. Conus and cauda equina appear normal. Paraspinal and other soft tissues: At the area of the right L3-4 disc space concerning for discitis/osteomyelitis there is mild edema within the adjacent right psoas muscle (axial series 8 image 28 and sagittal series 7, image 2) further raising suspicion for infected disc. The kidneys are partially visualized. There are multiple small decreased T1 and increased T2 signal cyst within the upper pole of the right kidney. Disc levels: T11-12 and T12-L1: Unremarkable. L1-2: Mild-to-moderate bilateral facet joint hypertrophy. Mild broad-based posterior disc osteophyte complex. Mild-to-moderate bilateral neuroforaminal stenosis. No central canal stenosis. L2-3: Mild-to-moderate bilateral facet joint hypertrophy. Mild broad-based posterior disc osteophyte complex. Mild narrowing of the bilateral neural foramina without true stenosis. No central canal stenosis. L3-4: Mild-to-moderate bilateral facet joint hypertrophy. Moderate broad-based posterior disc osteophyte complex with bilateral intraforaminal extension. Moderate right-greater-than-left neuroforaminal stenosis. Moderate narrowing of the  lateral recesses. Prominent epidural fat around the thecal sac. Mild-to-moderate central canal stenosis. L4-5: Moderate left and mild right facet joint hypertrophy. Mild-to-moderate broad-based posterior disc osteophyte complex with left greater than right intraforaminal extension. Moderate to severe left and mild-to-moderate right neuroforaminal stenosis. Mild narrowing of the lateral recesses. Prominent epidural fat around the thecal sac. Overall mild left-greater-than-right central canal stenosis. L5-S1: Moderate left and mild right facet joint hypertrophy. Mild broad-based posterior disc bulge.  Mild left-greater-than-right neuroforaminal stenosis. No central canal stenosis. IMPRESSION: 1. Transitional lumbosacral anatomy. There are considered to be 13 thoracic vertebral bodies with segmental transverse processes but no ribs at "T13." Recommend correlation with this numbering nomenclature prior to any image guided invasive procedure. 2. Findings are suspicious for right-greater-than-left L3-4 discitis/osteomyelitis including right-greater-than-left endplate marrow edema, endplate irregularity, and fluid within the disc space. No definite paraspinal or epidural abscess is seen. There does appear to be inflammatory edema within the adjacent right psoas muscle. 3. Multilevel degenerative disc and joint changes as above. Mild-to-moderate L3-4 and mild left-greater-than-right L4-5 central canal stenosis. 4. Multilevel neuroforaminal stenosis including moderate right-greater-than-left L3-4 and moderate to severe left and mild-to-moderate right L4-5 neuroforaminal stenosis. Critical Value/emergent results were called by telephone at the time of interpretation on 12/26/2021 at 5:58 pm to provider [facility] , who verbally acknowledged these results. Electronically Signed   By: Yvonne Kendall M.D.   On: 12/26/2021 17:58   DG Lumbar Spine Complete  Result Date: 12/26/2021 CLINICAL DATA:  Back pain for 1 week, no known  injury EXAM: LUMBAR SPINE - COMPLETE 4+ VIEW COMPARISON:  None Available. FINDINGS: No fracture or dislocation of the lumbar spine. Moderate to severe disc space height loss and osteophytosis of the lower lumbar levels, worst from L3 through S1. Otherwise mild disc space height loss and osteophytosis. Moderate to severe facet degenerative change at the lower lumbar levels. Nonobstructive pattern of overlying bowel gas. IMPRESSION: 1.  No fracture or dislocation of the lumbar spine. 2. Moderate to severe disc and facet degenerative disease of the lower lumbar levels, worst from L3 through S1. Lumbar disc and neural foraminal pathology may be further evaluated by MRI if indicated by neurologically localizing signs and symptoms. Electronically Signed   By: Delanna Ahmadi M.D.   On: 12/26/2021 14:20    EKG: Independently reviewed. ***  Assessment/Plan Principal Problem:   Discitis of lumbar region   ***  DVT prophylaxis: *** (Lovenox/Heparin/SCD's/anticoagulated/None (if comfort care) Code Status: *** (Full/Partial (specify details) Family Communication: *** (Specify name, relationship. Do not write "discussed with patient". Specify tel # if discussed over the phone) Disposition Plan: *** (specify when and where you expect patient to be discharged) Consults called: *** (with names) Admission status: *** (inpatient / obs / tele / medical floor / SDU)   Clance Boll MD [facility] Pager [phone]  If 7PM-7AM, please contact night-coverage [email or web address] Password [facility]  12/26/2021, 8:13 PM

## 2021-12-26 NOTE — Telephone Encounter (Signed)
-----   Message from Eileen Stanford, [place] sent at 12/24/2021  8:14 PM EDT ----- Regarding: FW: Change in Dr  ----- Message ----- From: Eileen Stanford, PA-C Sent: 12/23/2021   3:49 PM EDT To: Jacinta Shoe, CMA Subject: Change in Dr                                   Lazaro Arms you mind calling this pt and letting him know we are going to switch his primary cardiologist from [facility] to [place] and get him an apt with thukkani around the same time he is supposed to see Dr. Tamala Julian and cancel the [facility] apt?   Thank you! KT

## 2021-12-26 NOTE — Discharge Instructions (Signed)
Please take Tylenol and Flexeril as needed for pain.  You may also apply Voltaren cream to your lower back to help with your pain.  Follow-up with an orthopedic doctor if you notice no improvement of your symptoms.

## 2021-12-26 NOTE — Telephone Encounter (Signed)
Attempted to reach patient.  Phone rang, no VM.  Unable to leave a message.  Will try back at later time.

## 2021-12-26 NOTE — Progress Notes (Signed)
Providing Compassionate, Quality Care - Together   Patient with past medical history significant for atrial fibrillation, GERD, COPD, and CHF. He presented to the [facility] ED via EMS with complaint of worsening low back pain. Symptoms ongoing for the last month. Patient with elevated white count. MRI demonstrates findings that are suspicious for L3-4 discitis/osteomyelitis. There is no definite paraspinal or epidural abscess. There does appear to be inflammatory edema within the adjacent right psoas muscle. Chronic degenerative changes are also noted. No Neurosurgical intervention is warranted at this time. Recommend consulting IR to see if they are able to aspirate any fluid for culture and sensitivity. Infectious Disease should also be consulted for antibiotic recommendations. Please re-consult if we can be of further assistance.   Vital signs reviewed.  Vital signs in last 24 hours: Temp:  [98 F (36.7 C)] 98 F (36.7 C) (06/09 1300) Pulse Rate:  [57-68] 57 (06/09 1835) Resp:  [17-18] 18 (06/09 1835) BP: (111-132)/(58-88) 125/62 (06/09 1835) SpO2:  [94 %-97 %] 95 % (06/09 1835) Weight:  [92.1 kg] 92.1 kg (06/09 1300)   Lab Results: Recent Labs    12/26/21 1512  WBC 13.4*  HGB 16.0  16.7  HCT 50.7  49.0  PLT 276   BMET Recent Labs    12/26/21 1449 12/26/21 1512  NA 139 139  K 4.3 4.2  CL 98 98  CO2 30  --   GLUCOSE 126* 127*  BUN 41* 40*  CREATININE 2.14* 2.40*  CALCIUM 8.7*  --     Studies/Results: MR LUMBAR SPINE WO CONTRAST  Result Date: 12/26/2021 CLINICAL DATA:  Lower back pain. Symptoms persist with 6 weeks treatment. Bilateral leg pain. EXAM: MRI LUMBAR SPINE WITHOUT CONTRAST TECHNIQUE: Multiplanar, multisequence MR imaging of the lumbar spine was performed. No intravenous contrast was administered. COMPARISON:  Lumbar spine radiographs 12/26/2021 FINDINGS: Segmentation: There is transitional lumbosacral anatomy. Correlating with prior 09/29/2021 CT there appear  to be 12 rib-bearing thoracic type vertebral bodies. The next vertebral body is transitional with segmented transverse processes but no ribs. This is considered T13. Distal to this, the next 5 vertebral bodies are considered L1 through L5. Axial series 8 image 9 corresponds to the T13-L1 disc level, image 16 corresponds to the L1-2 disc level, and image 37 corresponds to the L5-S1 disc level. There is a transitional right L5-S1 assimilation joint with associated partially visualized degenerative change and marrow edema (sagittal series 7, image 1/17 and axial image 35/41). Alignment:  2 mm retrolisthesis of L1 on L2. Vertebrae: Vertebral body heights are maintained. Severe right-greater-than-left L3-4 diffuse severe L4-5, and moderate right-greater-than-left L2-3 and mild right-greater-than-left L1-2 disc space narrowing. Decreased disc hydration is greatest at L4-5. There is mild-to-moderate endplate irregularity of the right-greater-than-left L3-4 disc space with moderate marrow edema and mild chronic fat intensity marrow endplate degenerative changes at the right L3-4 disc space. There is mild fluid throughout the L3-4 disc space raising concern for discitis/osteomyelitis. Conus medullaris and cauda equina: Conus extends to the mid to inferior T12 level. Conus and cauda equina appear normal. Paraspinal and other soft tissues: At the area of the right L3-4 disc space concerning for discitis/osteomyelitis there is mild edema within the adjacent right psoas muscle (axial series 8 image 28 and sagittal series 7, image 2) further raising suspicion for infected disc. The kidneys are partially visualized. There are multiple small decreased T1 and increased T2 signal cyst within the upper pole of the right kidney. Disc levels: T11-12 and T12-L1: Unremarkable.  L1-2: Mild-to-moderate bilateral facet joint hypertrophy. Mild broad-based posterior disc osteophyte complex. Mild-to-moderate bilateral neuroforaminal stenosis. No  central canal stenosis. L2-3: Mild-to-moderate bilateral facet joint hypertrophy. Mild broad-based posterior disc osteophyte complex. Mild narrowing of the bilateral neural foramina without true stenosis. No central canal stenosis. L3-4: Mild-to-moderate bilateral facet joint hypertrophy. Moderate broad-based posterior disc osteophyte complex with bilateral intraforaminal extension. Moderate right-greater-than-left neuroforaminal stenosis. Moderate narrowing of the lateral recesses. Prominent epidural fat around the thecal sac. Mild-to-moderate central canal stenosis. L4-5: Moderate left and mild right facet joint hypertrophy. Mild-to-moderate broad-based posterior disc osteophyte complex with left greater than right intraforaminal extension. Moderate to severe left and mild-to-moderate right neuroforaminal stenosis. Mild narrowing of the lateral recesses. Prominent epidural fat around the thecal sac. Overall mild left-greater-than-right central canal stenosis. L5-S1: Moderate left and mild right facet joint hypertrophy. Mild broad-based posterior disc bulge. Mild left-greater-than-right neuroforaminal stenosis. No central canal stenosis. IMPRESSION: 1. Transitional lumbosacral anatomy. There are considered to be 13 thoracic vertebral bodies with segmental transverse processes but no ribs at "T13." Recommend correlation with this numbering nomenclature prior to any image guided invasive procedure. 2. Findings are suspicious for right-greater-than-left L3-4 discitis/osteomyelitis including right-greater-than-left endplate marrow edema, endplate irregularity, and fluid within the disc space. No definite paraspinal or epidural abscess is seen. There does appear to be inflammatory edema within the adjacent right psoas muscle. 3. Multilevel degenerative disc and joint changes as above. Mild-to-moderate L3-4 and mild left-greater-than-right L4-5 central canal stenosis. 4. Multilevel neuroforaminal stenosis including  moderate right-greater-than-left L3-4 and moderate to severe left and mild-to-moderate right L4-5 neuroforaminal stenosis. Critical Value/emergent results were called by telephone at the time of interpretation on 12/26/2021 at 5:58 pm to provider [facility] , who verbally acknowledged these results. Electronically Signed   By: Yvonne Kendall M.D.   On: 12/26/2021 17:58   DG Lumbar Spine Complete  Result Date: 12/26/2021 CLINICAL DATA:  Back pain for 1 week, no known injury EXAM: LUMBAR SPINE - COMPLETE 4+ VIEW COMPARISON:  None Available. FINDINGS: No fracture or dislocation of the lumbar spine. Moderate to severe disc space height loss and osteophytosis of the lower lumbar levels, worst from L3 through S1. Otherwise mild disc space height loss and osteophytosis. Moderate to severe facet degenerative change at the lower lumbar levels. Nonobstructive pattern of overlying bowel gas. IMPRESSION: 1.  No fracture or dislocation of the lumbar spine. 2. Moderate to severe disc and facet degenerative disease of the lower lumbar levels, worst from L3 through S1. Lumbar disc and neural foraminal pathology may be further evaluated by MRI if indicated by neurologically localizing signs and symptoms. Electronically Signed   By: Delanna Ahmadi M.D.   On: 12/26/2021 14:20     Sable Feil, AGNP-C Nurse Practitioner  [facility] [place] [address], [place], [place] [address] P: [phone]    F: [phone]  12/26/2021, 7:13 PM

## 2021-12-27 ENCOUNTER — Other Ambulatory Visit: Payer: Self-pay

## 2021-12-27 DIAGNOSIS — M4646 Discitis, unspecified, lumbar region: Secondary | ICD-10-CM | POA: Diagnosis not present

## 2021-12-27 LAB — COMPREHENSIVE METABOLIC PANEL
ALT: 26 U/L (ref 0–44)
AST: 20 U/L (ref 15–41)
Albumin: 3 g/dL — ABNORMAL LOW (ref 3.5–5.0)
Alkaline Phosphatase: 116 U/L (ref 38–126)
Anion gap: 10 (ref 5–15)
BUN: 44 mg/dL — ABNORMAL HIGH (ref 8–23)
CO2: 30 mmol/L (ref 22–32)
Calcium: 8.3 mg/dL — ABNORMAL LOW (ref 8.9–10.3)
Chloride: 100 mmol/L (ref 98–111)
Creatinine, Ser: 2.12 mg/dL — ABNORMAL HIGH (ref 0.61–1.24)
GFR, Estimated: 32 mL/min — ABNORMAL LOW (ref >60)
Glucose, Bld: 116 mg/dL — ABNORMAL HIGH (ref 70–99)
Potassium: 4.4 mmol/L (ref 3.5–5.1)
Sodium: 140 mmol/L (ref 135–145)
Total Bilirubin: 0.9 mg/dL (ref 0.3–1.2)
Total Protein: 7 g/dL (ref 6.5–8.1)

## 2021-12-27 LAB — CBC
HCT: 48.2 % (ref 39.0–52.0)
Hemoglobin: 15.1 g/dL (ref 13.0–17.0)
MCH: 30.4 pg (ref 26.0–34.0)
MCHC: 31.3 g/dL (ref 30.0–36.0)
MCV: 97.2 fL (ref 80.0–100.0)
Platelets: 236 10*3/uL (ref 150–400)
RBC: 4.96 MIL/uL (ref 4.22–5.81)
RDW: 19.1 % — ABNORMAL HIGH (ref 11.5–15.5)
WBC: 11.9 10*3/uL — ABNORMAL HIGH (ref 4.0–10.5)
nRBC: 0 % (ref 0.0–0.2)

## 2021-12-27 LAB — PROTIME-INR
INR: 1.1 (ref 0.8–1.2)
Prothrombin Time: 14.4 seconds (ref 11.4–15.2)

## 2021-12-27 LAB — SEDIMENTATION RATE: Sed Rate: 31 mm/hr — ABNORMAL HIGH (ref 0–16)

## 2021-12-27 LAB — MRSA NEXT GEN BY PCR, NASAL: MRSA by PCR Next Gen: DETECTED — AB

## 2021-12-27 LAB — C-REACTIVE PROTEIN: CRP: 15.9 mg/dL — ABNORMAL HIGH (ref <1.0)

## 2021-12-27 LAB — LACTIC ACID, PLASMA: Lactic Acid, Venous: 1.6 mmol/L (ref 0.5–1.9)

## 2021-12-27 LAB — PROCALCITONIN: Procalcitonin: <0.1 ng/mL

## 2021-12-27 MED ORDER — SPIRONOLACTONE 12.5 MG HALF TABLET
12.5000 mg | ORAL_TABLET | Freq: Every day | ORAL | Status: DC
  Administered 2021-12-27: 12.5 mg via ORAL
  Filled 2021-12-27 (×2): qty 1

## 2021-12-27 MED ORDER — MORPHINE SULFATE (PF) 2 MG/ML IV SOLN
2.0000 mg | INTRAVENOUS | Status: DC | PRN
  Administered 2021-12-27 – 2021-12-31 (×11): 2 mg via INTRAVENOUS
  Filled 2021-12-27 (×12): qty 1

## 2021-12-27 MED ORDER — HYDRALAZINE HCL 20 MG/ML IJ SOLN: 10.0000 mg | INTRAMUSCULAR | Status: DC | PRN

## 2021-12-27 MED ORDER — GABAPENTIN 100 MG PO CAPS
100.0000 mg | ORAL_CAPSULE | Freq: Two times a day (BID) | ORAL | Status: DC
  Administered 2021-12-27: 100 mg via ORAL
  Filled 2021-12-27: qty 1

## 2021-12-27 MED ORDER — BUPRENORPHINE HCL-NALOXONE HCL 8-2 MG SL SUBL
1.0000 | SUBLINGUAL_TABLET | Freq: Every day | SUBLINGUAL | Status: DC
  Administered 2021-12-27 – 2022-01-05 (×10): 1 via SUBLINGUAL
  Filled 2021-12-27 (×10): qty 1

## 2021-12-27 MED ORDER — FLUTICASONE FUROATE-VILANTEROL 200-25 MCG/ACT IN AEPB
1.0000 | INHALATION_SPRAY | Freq: Every day | RESPIRATORY_TRACT | Status: DC
  Administered 2021-12-28 – 2022-01-04 (×8): 1 via RESPIRATORY_TRACT
  Filled 2021-12-27: qty 28

## 2021-12-27 MED ORDER — METOPROLOL TARTRATE 5 MG/5ML IV SOLN: 5.0000 mg | INTRAVENOUS | Status: DC | PRN

## 2021-12-27 MED ORDER — ATORVASTATIN CALCIUM 40 MG PO TABS
80.0000 mg | ORAL_TABLET | Freq: Every day | ORAL | Status: DC
  Administered 2021-12-27 – 2021-12-29 (×3): 80 mg via ORAL
  Filled 2021-12-27 (×3): qty 2

## 2021-12-27 MED ORDER — ALBUTEROL SULFATE (2.5 MG/3ML) 0.083% IN NEBU: 2.5000 mg | INHALATION_SOLUTION | Freq: Four times a day (QID) | RESPIRATORY_TRACT | Status: DC | PRN

## 2021-12-27 MED ORDER — SENNOSIDES-DOCUSATE SODIUM 8.6-50 MG PO TABS: 1.0000 | ORAL_TABLET | Freq: Every evening | ORAL | Status: DC | PRN

## 2021-12-27 MED ORDER — IPRATROPIUM-ALBUTEROL 0.5-2.5 (3) MG/3ML IN SOLN
3.0000 mL | RESPIRATORY_TRACT | Status: DC | PRN
  Administered 2021-12-28 – 2021-12-30 (×2): 3 mL via RESPIRATORY_TRACT
  Filled 2021-12-27 (×2): qty 3

## 2021-12-27 MED ORDER — POTASSIUM CHLORIDE CRYS ER 20 MEQ PO TBCR
20.0000 meq | EXTENDED_RELEASE_TABLET | Freq: Every day | ORAL | Status: DC
  Administered 2021-12-27 – 2021-12-29 (×3): 20 meq via ORAL
  Filled 2021-12-27 (×3): qty 1

## 2021-12-27 MED ORDER — AMIODARONE HCL 200 MG PO TABS
200.0000 mg | ORAL_TABLET | Freq: Every day | ORAL | Status: DC
  Administered 2021-12-27 – 2022-01-05 (×10): 200 mg via ORAL
  Filled 2021-12-27 (×10): qty 1

## 2021-12-27 MED ORDER — TORSEMIDE 20 MG PO TABS
60.0000 mg | ORAL_TABLET | Freq: Two times a day (BID) | ORAL | Status: DC
  Administered 2021-12-27 (×2): 60 mg via ORAL
  Filled 2021-12-27 (×3): qty 3

## 2021-12-27 MED ORDER — METOPROLOL SUCCINATE ER 25 MG PO TB24
12.5000 mg | ORAL_TABLET | Freq: Every day | ORAL | Status: DC
  Administered 2021-12-27 – 2022-01-05 (×9): 12.5 mg via ORAL
  Filled 2021-12-27 (×10): qty 1

## 2021-12-27 MED ORDER — GUAIFENESIN 100 MG/5ML PO LIQD: 5.0000 mL | ORAL | Status: DC | PRN

## 2021-12-27 MED ORDER — DAPAGLIFLOZIN PROPANEDIOL 10 MG PO TABS
10.0000 mg | ORAL_TABLET | Freq: Every day | ORAL | Status: DC
  Administered 2021-12-27: 10 mg via ORAL
  Filled 2021-12-27 (×2): qty 1

## 2021-12-27 MED ORDER — APIXABAN 5 MG PO TABS: 5.0000 mg | ORAL_TABLET | Freq: Two times a day (BID) | ORAL | Status: DC

## 2021-12-27 MED ORDER — GABAPENTIN 300 MG PO CAPS
300.0000 mg | ORAL_CAPSULE | Freq: Three times a day (TID) | ORAL | Status: DC
  Administered 2021-12-27 – 2021-12-30 (×10): 300 mg via ORAL
  Filled 2021-12-27 (×11): qty 1

## 2021-12-27 MED ORDER — ENOXAPARIN SODIUM 40 MG/0.4ML IJ SOSY
40.0000 mg | PREFILLED_SYRINGE | Freq: Every day | INTRAMUSCULAR | Status: DC
Start: 2021-12-27 — End: 2021-12-28
  Administered 2021-12-27: 40 mg via SUBCUTANEOUS
  Filled 2021-12-27 (×2): qty 0.4

## 2021-12-27 MED ORDER — DOCUSATE SODIUM 100 MG PO CAPS
100.0000 mg | ORAL_CAPSULE | Freq: Two times a day (BID) | ORAL | Status: DC
  Administered 2021-12-27 – 2022-01-05 (×19): 100 mg via ORAL
  Filled 2021-12-27 (×19): qty 1

## 2021-12-27 NOTE — Progress Notes (Signed)
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  X-ray was negative, MRI showed concerns of discitis/osteomyelitis of L3-L4 region.  Neurosurgery was consulted who recommended IR consultation for drainage and culture if they can get any fluid.   Assessment & Plan:  Principal Problem:   Discitis of lumbar region Active Problems:   Atrial fibrillation (HCC)   Alcoholism (HCC)   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HCC)   Myeloproliferative neoplasm (HCC)   COPD (chronic obstructive pulmonary disease) (HCC)   PAD (peripheral artery disease) (HCC)   Aortic atherosclerosis (HCC)   CAD S/P percutaneous coronary angioplasty   S/P mitral valve clip implantation   Lower back pain secondary to acute osteomyelitis/discitis of L3-L4 region -MRI shows evidence of osteomyelitis/discitis - IR consulted Dr Vernard Gambles, planned for procedure on Monday morning. Eliquis on hold in the meantime.  - Antibiotics-IV vancomycin and Zosyn - Seen by neurosurgery.  No planned intervention - We will consult infectious disease  Congestive heart failure with reduced ejection fraction, 35% - Patient appears to be compensated right now.  Continue Farxiga, Toprol-XL, Aldactone, torsemide  CKD stage III - Baseline creatinine around 2.2.  Currently at baseline  CAD status post PCI to mid LAD 09/2020 - Currently chest pain-free.  On Eliquis and statin.  Severe mitral regurgitation status post TAVR/MitraClip Severe TR/aortic insufficiency - Patient underwent MitraClip placement 11/2021.  Currently on Eliquis (on  hold).  Not on aspirin and Plavix - Tricuspid and aortic valve under surveillance by cardiology Spoke with Dr Stanford Breed from cardiology, notified ptn Is admitted- ok with holding AC if needed for the procedure. If his Cultures are positive, then he will need TEE. Appreciate his recs.   Persistent atrial fibrillation/flutter status post ablation  History of COPD - Bronchodilators.  I-S/flutter valve.   DVT prophylaxis: Eliquis Code Status: Full Code Family Communication:  Called [name]- updated   Status is: Inpatient Remains inpatient appropriate because: Hospital stay for IV Abx. Likely will be here for atleast 3-5 days    Subjective: Has low back pain, remains afebrile.     Examination:  General exam: Appears calm and comfortable  Respiratory system: Clear to auscultation. Respiratory effort normal. Cardiovascular system: S1 & S2 heard, RRR. No JVD, murmurs, rubs, gallops or clicks. No pedal edema. Gastrointestinal system: Abdomen is nondistended, soft and nontender. No organomegaly or masses felt. Normal bowel sounds heard. Central nervous system: Alert and oriented. No focal neurological deficits. Extremities: Symmetric 5 x 5 power. Skin: No rashes, lesions or ulcers Psychiatry: Judgement and insight appear normal. Mood & affect appropriate.     Objective: Vitals:   12/26/21 2111 12/26/21 2114 12/27/21 0131 12/27/21 0505  BP: (!) 125/56  (!) 105/52 109/78  Pulse: 65  (!) 58 64  Resp: '18  19 18  '$ Temp: 97.7 F (36.5 C)  98 F (36.7 C) 98.6 F (37 C)  TempSrc: Oral  Oral Oral  SpO2: 97%  97% 98%  Weight:  93.3 kg  93.3 kg  Height:  '5\' 6"'$  (1.676 m)      Intake/Output Summary (Last 24 hours) at 12/27/2021 5462 Last data  filed at 12/27/2021 0500 Gross per 24 hour  Intake 797.21 ml  Output 500 ml  Net 297.21 ml   Filed Weights   12/26/21 1300 12/26/21 2114 12/27/21 0505  Weight: 92.1 kg 93.3 kg 93.3 kg     Data Reviewed:   CBC: Recent Labs  Lab  12/26/21 1512 12/27/21 0055  WBC 13.4* 11.9*  NEUTROABS 10.6*  --   HGB 16.0  16.7 15.1  HCT 50.7  49.0 48.2  MCV 96.0 97.2  PLT 276 967   Basic Metabolic Panel: Recent Labs  Lab 12/26/21 1449 12/26/21 1512 12/27/21 0055  NA 139 139 140  K 4.3 4.2 4.4  CL 98 98 100  CO2 30  --  30  GLUCOSE 126* 127* 116*  BUN 41* 40* 44*  CREATININE 2.14* 2.40* 2.12*  CALCIUM 8.7*  --  8.3*   GFR: Estimated Creatinine Clearance: 32.7 mL/min (A) (by C-G formula based on SCr of 2.12 mg/dL (H)). Liver Function Tests: Recent Labs  Lab 12/27/21 0055  AST 20  ALT 26  ALKPHOS 116  BILITOT 0.9  PROT 7.0  ALBUMIN 3.0*   No results for input(s): "LIPASE", "AMYLASE" in the last 168 hours. No results for input(s): "AMMONIA" in the last 168 hours. Coagulation Profile: Recent Labs  Lab 12/27/21 0055  INR 1.1   Cardiac Enzymes: No results for input(s): "CKTOTAL", "CKMB", "CKMBINDEX", "TROPONINI" in the last 168 hours. BNP (last 3 results) No results for input(s): "PROBNP" in the last 8760 hours. HbA1C: No results for input(s): "HGBA1C" in the last 72 hours. CBG: No results for input(s): "GLUCAP" in the last 168 hours. Lipid Profile: No results for input(s): "CHOL", "HDL", "LDLCALC", "TRIG", "CHOLHDL", "LDLDIRECT" in the last 72 hours. Thyroid Function Tests: No results for input(s): "TSH", "T4TOTAL", "FREET4", "T3FREE", "THYROIDAB" in the last 72 hours. Anemia Panel: No results for input(s): "VITAMINB12", "FOLATE", "FERRITIN", "TIBC", "IRON", "RETICCTPCT" in the last 72 hours. Sepsis Labs: Recent Labs  Lab 12/26/21 2207 12/27/21 0055  PROCALCITON <0.10  --   LATICACIDVEN 1.5 1.6    Recent Results (from the past 240 hour(s))  MRSA Next Gen by PCR, Nasal     Status: Abnormal   Collection Time: 12/26/21 10:26 PM   Specimen: Nasal Mucosa; Nasal Swab  Result Value Ref Range Status   MRSA by PCR Next Gen DETECTED (A) NOT DETECTED Final    Comment: RESULT CALLED TO, READ BACK BY  AND VERIFIED WITH: Lear Ng, RN 12/27/2021 @ 72 [facility] (NOTE) The GeneXpert MRSA Assay (FDA approved for NASAL specimens only), is one component of a comprehensive MRSA colonization surveillance program. It is not intended to diagnose MRSA infection nor to guide or monitor treatment for MRSA infections. Test performance is not FDA approved in patients less than 55 years old. Performed at [facility], [place] [address] [place], [place] [address]          Radiology Studies: MR LUMBAR SPINE WO CONTRAST  Result Date: 12/26/2021 CLINICAL DATA:  Lower back pain. Symptoms persist with 6 weeks treatment. Bilateral leg pain. EXAM: MRI LUMBAR SPINE WITHOUT CONTRAST TECHNIQUE: Multiplanar, multisequence MR imaging of the lumbar spine was performed. No intravenous contrast was administered. COMPARISON:  Lumbar spine radiographs 12/26/2021 FINDINGS: Segmentation: There is transitional lumbosacral anatomy. Correlating with prior 09/29/2021 CT there appear to be 12 rib-bearing thoracic type vertebral bodies. The next vertebral body is transitional with segmented transverse processes but no ribs. This is considered T13. Distal to this, the next 5 vertebral  bodies are considered L1 through L5. Axial series 8 image 9 corresponds to the T13-L1 disc level, image 16 corresponds to the L1-2 disc level, and image 37 corresponds to the L5-S1 disc level. There is a transitional right L5-S1 assimilation joint with associated partially visualized degenerative change and marrow edema (sagittal series 7, image 1/17 and axial image 35/41). Alignment:  2 mm retrolisthesis of L1 on L2. Vertebrae: Vertebral body heights are maintained. Severe right-greater-than-left L3-4 diffuse severe L4-5, and moderate right-greater-than-left L2-3 and mild right-greater-than-left L1-2 disc space narrowing. Decreased disc hydration is greatest at L4-5. There is mild-to-moderate endplate irregularity of the  right-greater-than-left L3-4 disc space with moderate marrow edema and mild chronic fat intensity marrow endplate degenerative changes at the right L3-4 disc space. There is mild fluid throughout the L3-4 disc space raising concern for discitis/osteomyelitis. Conus medullaris and cauda equina: Conus extends to the mid to inferior T12 level. Conus and cauda equina appear normal. Paraspinal and other soft tissues: At the area of the right L3-4 disc space concerning for discitis/osteomyelitis there is mild edema within the adjacent right psoas muscle (axial series 8 image 28 and sagittal series 7, image 2) further raising suspicion for infected disc. The kidneys are partially visualized. There are multiple small decreased T1 and increased T2 signal cyst within the upper pole of the right kidney. Disc levels: T11-12 and T12-L1: Unremarkable. L1-2: Mild-to-moderate bilateral facet joint hypertrophy. Mild broad-based posterior disc osteophyte complex. Mild-to-moderate bilateral neuroforaminal stenosis. No central canal stenosis. L2-3: Mild-to-moderate bilateral facet joint hypertrophy. Mild broad-based posterior disc osteophyte complex. Mild narrowing of the bilateral neural foramina without true stenosis. No central canal stenosis. L3-4: Mild-to-moderate bilateral facet joint hypertrophy. Moderate broad-based posterior disc osteophyte complex with bilateral intraforaminal extension. Moderate right-greater-than-left neuroforaminal stenosis. Moderate narrowing of the lateral recesses. Prominent epidural fat around the thecal sac. Mild-to-moderate central canal stenosis. L4-5: Moderate left and mild right facet joint hypertrophy. Mild-to-moderate broad-based posterior disc osteophyte complex with left greater than right intraforaminal extension. Moderate to severe left and mild-to-moderate right neuroforaminal stenosis. Mild narrowing of the lateral recesses. Prominent epidural fat around the thecal sac. Overall mild  left-greater-than-right central canal stenosis. L5-S1: Moderate left and mild right facet joint hypertrophy. Mild broad-based posterior disc bulge. Mild left-greater-than-right neuroforaminal stenosis. No central canal stenosis. IMPRESSION: 1. Transitional lumbosacral anatomy. There are considered to be 13 thoracic vertebral bodies with segmental transverse processes but no ribs at "T13." Recommend correlation with this numbering nomenclature prior to any image guided invasive procedure. 2. Findings are suspicious for right-greater-than-left L3-4 discitis/osteomyelitis including right-greater-than-left endplate marrow edema, endplate irregularity, and fluid within the disc space. No definite paraspinal or epidural abscess is seen. There does appear to be inflammatory edema within the adjacent right psoas muscle. 3. Multilevel degenerative disc and joint changes as above. Mild-to-moderate L3-4 and mild left-greater-than-right L4-5 central canal stenosis. 4. Multilevel neuroforaminal stenosis including moderate right-greater-than-left L3-4 and moderate to severe left and mild-to-moderate right L4-5 neuroforaminal stenosis. Critical Value/emergent results were called by telephone at the time of interpretation on 12/26/2021 at 5:58 pm to provider [facility] , who verbally acknowledged these results. Electronically Signed   By: Yvonne Kendall M.D.   On: 12/26/2021 17:58   DG Lumbar Spine Complete  Result Date: 12/26/2021 CLINICAL DATA:  Back pain for 1 week, no known injury EXAM: LUMBAR SPINE - COMPLETE 4+ VIEW COMPARISON:  None Available. FINDINGS: No fracture or dislocation of the lumbar spine. Moderate to severe disc space height loss and osteophytosis of  the lower lumbar levels, worst from L3 through S1. Otherwise mild disc space height loss and osteophytosis. Moderate to severe facet degenerative change at the lower lumbar levels. Nonobstructive pattern of overlying bowel gas. IMPRESSION: 1.  No fracture or  dislocation of the lumbar spine. 2. Moderate to severe disc and facet degenerative disease of the lower lumbar levels, worst from L3 through S1. Lumbar disc and neural foraminal pathology may be further evaluated by MRI if indicated by neurologically localizing signs and symptoms. Electronically Signed   By: Delanna Ahmadi M.D.   On: 12/26/2021 14:20        Scheduled Meds:  apixaban  5 mg Oral BID   atorvastatin  80 mg Oral Daily   buprenorphine-naloxone  1 tablet Sublingual Daily   dapagliflozin propanediol  10 mg Oral QAC breakfast   fluticasone furoate-vilanterol  1 puff Inhalation Daily   gabapentin  100 mg Oral BID   metoprolol succinate  12.5 mg Oral Daily   potassium chloride SA  20 mEq Oral Daily   sodium chloride flush  3 mL Intravenous Q12H   spironolactone  12.5 mg Oral Daily   torsemide  60 mg Oral BID   Continuous Infusions:  sodium chloride 50 mL/hr at 12/26/21 2140   piperacillin-tazobactam (ZOSYN)  IV 3.375 g (12/27/21 0553)   vancomycin 1,250 mg (12/26/21 2316)     LOS: 1 day   Time spent= 35 mins    Jeff Mccallum Jemell Loader, MD [facility]  If 7PM-7AM, please contact night-coverage  12/27/2021, 7:42 AM

## 2021-12-27 NOTE — Consult Note (Addendum)
[facility]  Total days of antibiotics 2 Reason for Consult: lumbar discitis   Referring Physician: amin  Principal Problem:   Discitis of lumbar region Active Problems:   Atrial fibrillation (HCC)   Alcoholism ([place])   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HCC)   Myeloproliferative neoplasm (HCC)   COPD (chronic obstructive pulmonary disease) (HCC)   PAD (peripheral artery disease) (HCC)   Aortic atherosclerosis (HCC)   CAD S/P percutaneous coronary angioplasty   S/P mitral valve clip implantation    HPI: [name] is a 76 y.o. male with atrial fibrillation, severe MR with MV clipping on 5/23, COPD, GERD who was admitted with worsening lower back pain x 4-6 wk. He was found to have leukocytosis and back MRI concerning for L3-L4 discitis, no epidural abscess. Pain became excrutiating but no new numbness or weakness. No fever/chills/nightsweats. He is MRSA colonized. WBC of 11.9, he was started on vancomycin and piptazo. He describes the pain as being severe, sharp radiating down both legs.  Past Medical History:  Diagnosis Date   Acute lower GI bleeding 02/15/2017   Atrial fibrillation (HCC)    Cardiomyopathy    EF55% 11/14<<35%    Cardiomyopathy (HCC)    CHF (congestive heart failure) (HCC)    COPD (chronic obstructive pulmonary disease) (HCC)    Difficult airway for intubation    per telephone encounter notation   Dyspnea    Essential hypertension    GERD (gastroesophageal reflux disease)    Gout    Hepatitis    Possible history   History of atrial flutter    Ablation 2012   Hypertension    Myeloproliferative neoplasm ([place]) 08/15/2013   Obesity    Pneumonia X 1   Primary polycythemia ([place]) 06/12/2013   Renal disorder    Renal insufficiency    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 Ali Lowe   Severe mitral regurgitation    Substance abuse ([place])    History of alcohol;  hx cocaine, heroin, crack use   Syncope    Tubular adenoma of colon 08/2014    Allergies: No Known Allergies   MEDICATIONS:  amiodarone  200 mg Oral Daily   atorvastatin  80 mg Oral Daily   buprenorphine-naloxone  1 tablet Sublingual Daily   dapagliflozin propanediol  10 mg Oral QAC breakfast   docusate sodium  100 mg Oral BID   enoxaparin (LOVENOX) injection  40 mg Subcutaneous Daily   fluticasone furoate-vilanterol  1 puff Inhalation Daily   gabapentin  100 mg Oral BID   metoprolol succinate  12.5 mg Oral Daily   potassium chloride SA  20 mEq Oral Daily   sodium chloride flush  3 mL Intravenous Q12H   spironolactone  12.5 mg Oral Daily   torsemide  60 mg Oral BID    Social History   Tobacco Use   Smoking status: Former    Packs/day: 2.00    Years: 50.00    Total pack years: 100.00    Types: Cigarettes    Quit date: 11/25/2021    Years since quitting: 0.0   Smokeless tobacco: Never   Tobacco comments:    1-2 cigarettes a day for a couple years now. encouraged cessation  Vaping Use   Vaping Use: Never used  Substance Use Topics   Alcohol use: Yes    Alcohol/week: 2.0 standard drinks of alcohol    Types: 2 Shots of liquor per  week   Drug use: Not Currently    Types: Heroin    Comment: stopped use in 2015. currently on suboxone    Family History  Problem Relation Age of Onset   Diabetes Mother    Hypertension Mother    Cirrhosis Father    Alcohol abuse Father    Colon cancer Neg Hx    Rectal cancer Neg Hx    Stomach cancer Neg Hx    Esophageal cancer Neg Hx    Colon polyps Neg Hx     Review of Systems  Constitutional: Negative for fever, chills, diaphoresis, activity change, appetite change, fatigue and unexpected weight change.  HENT: Negative for congestion, sore throat, rhinorrhea, sneezing, trouble swallowing and sinus pressure.  Eyes: Negative for photophobia and visual disturbance.  Respiratory: Negative for cough, chest tightness, shortness of  breath, wheezing and stridor.  Cardiovascular: Negative for chest pain, palpitations and leg swelling.  Gastrointestinal: Negative for nausea, vomiting, abdominal pain, diarrhea, constipation, blood in stool, abdominal distention and anal bleeding.  Genitourinary: Negative for dysuria, hematuria, flank pain and difficulty urinating.  Musculoskeletal: +severe back pain. Negative for myalgias, back pain, joint swelling, arthralgias and gait problem. Also right shoulder pain Skin: Negative for color change, pallor, rash and wound.  Neurological: Negative for dizziness, tremors, weakness and light-headedness.  Hematological: Negative for adenopathy. Does not bruise/bleed easily.  Psychiatric/Behavioral: Negative for behavioral problems, confusion, sleep disturbance, dysphoric mood, decreased concentration and agitation.     OBJECTIVE: Temp:  [97.7 F (36.5 C)-100 F (37.8 C)] 98.6 F (37 C) (06/10 1629) Pulse Rate:  [57-81] 79 (06/10 1624) Resp:  [17-21] 18 (06/10 1624) BP: (95-125)/(50-82) 102/52 (06/10 1624) SpO2:  [91 %-98 %] 91 % (06/10 1624) Weight:  [93.3 kg] 93.3 kg (06/10 0505) Physical Exam  Constitutional: He is oriented to person, place, and time. He appears well-developed and well-nourished. No distress.  HENT:  Mouth/Throat: Oropharynx is clear and moist. No oropharyngeal exudate.  Cardiovascular: Normal rate, regular rhythm and normal heart sounds. Exam reveals no gallop and no friction rub.  No murmur heard.  Pulmonary/Chest: Effort normal and breath sounds normal. No respiratory distress. He has no wheezes.  Abdominal: Soft. Bowel sounds are normal. He exhibits no distension. There is no tenderness.  Lymphadenopathy:  He has no cervical adenopathy.  Neurological: He is alert and oriented to person, place, and time.  Skin: Skin is warm and dry. No rash noted. No erythema.  Psychiatric: He has a normal mood and affect. His behavior is normal.    LABS: Results for  orders placed or performed during the hospital encounter of 12/26/21 (from the past 48 hour(s))  Basic metabolic panel     Status: Abnormal   Collection Time: 12/26/21  2:49 PM  Result Value Ref Range   Sodium 139 135 - 145 mmol/L   Potassium 4.3 3.5 - 5.1 mmol/L   Chloride 98 98 - 111 mmol/L   CO2 30 22 - 32 mmol/L   Glucose, Bld 126 (H) 70 - 99 mg/dL    Comment: Glucose reference range applies only to samples taken after fasting for at least 8 hours.   BUN 41 (H) 8 - 23 mg/dL   Creatinine, Ser 2.14 (H) 0.61 - 1.24 mg/dL   Calcium 8.7 (L) 8.9 - 10.3 mg/dL   GFR, Estimated 32 (L) >60 mL/min    Comment: (NOTE) Calculated using the CKD-EPI Creatinine Equation (2021)    Anion gap 11 5 - 15    Comment: Performed  at [facility], [place] [address], [place], [place] [address]  Ginger Carne 8, ED     Status: Abnormal   Collection Time: 12/26/21  3:12 PM  Result Value Ref Range   Sodium 139 135 - 145 mmol/L   Potassium 4.2 3.5 - 5.1 mmol/L   Chloride 98 98 - 111 mmol/L   BUN 40 (H) 8 - 23 mg/dL   Creatinine, Ser 2.40 (H) 0.61 - 1.24 mg/dL   Glucose, Bld 127 (H) 70 - 99 mg/dL    Comment: Glucose reference range applies only to samples taken after fasting for at least 8 hours.   Calcium, Ion 1.03 (L) 1.15 - 1.40 mmol/L   TCO2 32 22 - 32 mmol/L   Hemoglobin 16.7 13.0 - 17.0 g/dL   HCT 49.0 39.0 - 52.0 %  CBC with Differential     Status: Abnormal   Collection Time: 12/26/21  3:12 PM  Result Value Ref Range   WBC 13.4 (H) 4.0 - 10.5 K/uL   RBC 5.28 4.22 - 5.81 MIL/uL   Hemoglobin 16.0 13.0 - 17.0 g/dL   HCT 50.7 39.0 - 52.0 %   MCV 96.0 80.0 - 100.0 fL   MCH 30.3 26.0 - 34.0 pg   MCHC 31.6 30.0 - 36.0 g/dL   RDW 19.4 (H) 11.5 - 15.5 %   Platelets 276 150 - 400 K/uL   nRBC 0.0 0.0 - 0.2 %   Neutrophils Relative % 78 %   Neutro Abs 10.6 (H) 1.7 - 7.7 K/uL   Lymphocytes Relative 10 %   Lymphs Abs 1.3 0.7 - 4.0 K/uL   Monocytes Relative 9 %   Monocytes Absolute 1.1  (H) 0.1 - 1.0 K/uL   Eosinophils Relative 1 %   Eosinophils Absolute 0.2 0.0 - 0.5 K/uL   Basophils Relative 1 %   Basophils Absolute 0.1 0.0 - 0.1 K/uL   Immature Granulocytes 1 %   Abs Immature Granulocytes 0.12 (H) 0.00 - 0.07 K/uL    Comment: Performed at [facility], [place] [address], [place], [place] [address]  Sedimentation rate     Status: Abnormal   Collection Time: 12/26/21  3:12 PM  Result Value Ref Range   Sed Rate 40 (H) 0 - 16 mm/hr    Comment: Performed at [facility], [place] [address] [place], [place] [address]  C-reactive protein     Status: Abnormal   Collection Time: 12/26/21  6:54 PM  Result Value Ref Range   CRP 4.5 (H) <1.0 mg/dL    Comment: Performed at [facility] [address] [place], [place] [address]  Procalcitonin - Baseline     Status: None   Collection Time: 12/26/21 10:07 PM  Result Value Ref Range   Procalcitonin <0.10 ng/mL    Comment:        Interpretation: PCT (Procalcitonin) <= 0.5 ng/mL: Systemic infection (sepsis) is not likely. Local bacterial infection is possible. (NOTE)       Sepsis PCT Algorithm           Lower Respiratory Tract                                      Infection PCT Algorithm    ----------------------------     ----------------------------         PCT < 0.25 ng/mL  PCT < 0.10 ng/mL          Strongly encourage             Strongly discourage   discontinuation of antibiotics    initiation of antibiotics    ----------------------------     -----------------------------       PCT 0.25 - 0.50 ng/mL            PCT 0.10 - 0.25 ng/mL               OR       >80% decrease in PCT            Discourage initiation of                                            antibiotics      Encourage discontinuation           of antibiotics    ----------------------------     -----------------------------         PCT >= 0.50 ng/mL              PCT 0.26 - 0.50 ng/mL               AND         <80% decrease in PCT             Encourage initiation of                                             antibiotics       Encourage continuation           of antibiotics    ----------------------------     -----------------------------        PCT >= 0.50 ng/mL                  PCT > 0.50 ng/mL               AND         increase in PCT                  Strongly encourage                                      initiation of antibiotics    Strongly encourage escalation           of antibiotics                                     -----------------------------                                           PCT <= 0.25 ng/mL                                                 OR                                        >  80% decrease in PCT                                      Discontinue / Do not initiate                                             antibiotics  Performed at [facility] [address], [place], [place] [address]   Lactic acid, plasma     Status: None   Collection Time: 12/26/21 10:07 PM  Result Value Ref Range   Lactic Acid, Venous 1.5 0.5 - 1.9 mmol/L    Comment: Performed at [facility], [place] [address], [place], [place] [address]  MRSA Next Gen by PCR, Nasal     Status: Abnormal   Collection Time: 12/26/21 10:26 PM   Specimen: Nasal Mucosa; Nasal Swab  Result Value Ref Range   MRSA by PCR Next Gen DETECTED (A) NOT DETECTED    Comment: RESULT CALLED TO, READ BACK BY AND VERIFIED WITH: Lear Ng, RN 12/27/2021 @ 0025 [facility] (NOTE) The GeneXpert MRSA Assay (FDA approved for NASAL specimens only), is one component of a comprehensive MRSA colonization surveillance program. It is not intended to diagnose MRSA infection nor to guide or monitor treatment for MRSA infections. Test performance is not FDA approved in patients less than 60 years old. Performed at [facility], [place] [address] [place], [place] [address]    Culture, blood (Routine X 2) w Reflex to ID Panel     Status: None (Preliminary result)   Collection Time: 12/27/21 12:55 AM   Specimen: BLOOD  Result Value Ref Range   Specimen Description      BLOOD BLOOD RIGHT HAND Performed at [facility] [address] [place], [place] [address]    Special Requests      BOTTLES DRAWN AEROBIC ONLY Blood Culture results may not be optimal due to an inadequate volume of blood received in culture bottles Performed at [facility] [address], [place], [place] [address]    Culture      NO GROWTH < 12 HOURS Performed at [facility] [address] [place], [place] [address]    Report Status PENDING   Culture, blood (Routine X 2) w Reflex to ID Panel     Status: None (Preliminary result)   Collection Time: 12/27/21 12:55 AM   Specimen: BLOOD  Result Value Ref Range   Specimen Description      BLOOD RIGHT ANTECUBITAL Performed at [facility] [address], [place], [place] [address]    Special Requests      BOTTLES DRAWN AEROBIC ONLY Blood Culture adequate volume Performed at [facility] [address] [place], [place] [address]    Culture      NO GROWTH < 12 HOURS Performed at [facility] [address], [place], [place] [address]    Report Status PENDING   Lactic acid, plasma     Status: None   Collection Time: 12/27/21 12:55 AM  Result Value Ref Range   Lactic Acid, Venous 1.6 0.5 - 1.9 mmol/L    Comment: Performed at [facility], [place] [address], [place], [place] [address]  Comprehensive  metabolic panel     Status: Abnormal   Collection Time: 12/27/21 12:55 AM  Result Value Ref Range   Sodium 140 135 - 145 mmol/L   Potassium 4.4 3.5 - 5.1 mmol/L   Chloride 100 98 - 111 mmol/L   CO2 30 22 - 32 mmol/L   Glucose, Bld 116 (H) 70 - 99 mg/dL    Comment: Glucose reference range applies only to samples taken after  fasting for at least 8 hours.   BUN 44 (H) 8 - 23 mg/dL   Creatinine, Ser 2.12 (H) 0.61 - 1.24 mg/dL   Calcium 8.3 (L) 8.9 - 10.3 mg/dL   Total Protein 7.0 6.5 - 8.1 g/dL   Albumin 3.0 (L) 3.5 - 5.0 g/dL   AST 20 15 - 41 U/L   ALT 26 0 - 44 U/L   Alkaline Phosphatase 116 38 - 126 U/L   Total Bilirubin 0.9 0.3 - 1.2 mg/dL   GFR, Estimated 32 (L) >60 mL/min    Comment: (NOTE) Calculated using the CKD-EPI Creatinine Equation (2021)    Anion gap 10 5 - 15    Comment: Performed at [facility], [place] [address], [place], [place] [address]  CBC     Status: Abnormal   Collection Time: 12/27/21 12:55 AM  Result Value Ref Range   WBC 11.9 (H) 4.0 - 10.5 K/uL   RBC 4.96 4.22 - 5.81 MIL/uL   Hemoglobin 15.1 13.0 - 17.0 g/dL   HCT 48.2 39.0 - 52.0 %   MCV 97.2 80.0 - 100.0 fL   MCH 30.4 26.0 - 34.0 pg   MCHC 31.3 30.0 - 36.0 g/dL   RDW 19.1 (H) 11.5 - 15.5 %   Platelets 236 150 - 400 K/uL   nRBC 0.0 0.0 - 0.2 %    Comment: Performed at [facility], [place] [address] [place], [place] [address]  Protime-INR     Status: None   Collection Time: 12/27/21 12:55 AM  Result Value Ref Range   Prothrombin Time 14.4 11.4 - 15.2 seconds   INR 1.1 0.8 - 1.2    Comment: (NOTE) INR goal varies based on device and disease states. Performed at [facility], [place] [address], [place], [place] [address]     MICRO: reviewed IMAGING: MR LUMBAR SPINE WO CONTRAST  Result Date: 12/26/2021 CLINICAL DATA:  Lower back pain. Symptoms persist with 6 weeks treatment. Bilateral leg pain. EXAM: MRI LUMBAR SPINE WITHOUT CONTRAST TECHNIQUE: Multiplanar, multisequence MR imaging of the lumbar spine was performed. No intravenous contrast was administered. COMPARISON:  Lumbar spine radiographs 12/26/2021 FINDINGS: Segmentation: There is transitional lumbosacral anatomy. Correlating with prior 09/29/2021 CT there appear to be 12 rib-bearing thoracic type vertebral  bodies. The next vertebral body is transitional with segmented transverse processes but no ribs. This is considered T13. Distal to this, the next 5 vertebral bodies are considered L1 through L5. Axial series 8 image 9 corresponds to the T13-L1 disc level, image 16 corresponds to the L1-2 disc level, and image 37 corresponds to the L5-S1 disc level. There is a transitional right L5-S1 assimilation joint with associated partially visualized degenerative change and marrow edema (sagittal series 7, image 1/17 and axial image 35/41). Alignment:  2 mm retrolisthesis of L1 on L2. Vertebrae: Vertebral body heights are maintained. Severe right-greater-than-left L3-4 diffuse severe L4-5, and moderate right-greater-than-left L2-3 and mild right-greater-than-left L1-2 disc space narrowing. Decreased disc hydration is greatest at L4-5. There is mild-to-moderate endplate irregularity  of the right-greater-than-left L3-4 disc space with moderate marrow edema and mild chronic fat intensity marrow endplate degenerative changes at the right L3-4 disc space. There is mild fluid throughout the L3-4 disc space raising concern for discitis/osteomyelitis. Conus medullaris and cauda equina: Conus extends to the mid to inferior T12 level. Conus and cauda equina appear normal. Paraspinal and other soft tissues: At the area of the right L3-4 disc space concerning for discitis/osteomyelitis there is mild edema within the adjacent right psoas muscle (axial series 8 image 28 and sagittal series 7, image 2) further raising suspicion for infected disc. The kidneys are partially visualized. There are multiple small decreased T1 and increased T2 signal cyst within the upper pole of the right kidney. Disc levels: T11-12 and T12-L1: Unremarkable. L1-2: Mild-to-moderate bilateral facet joint hypertrophy. Mild broad-based posterior disc osteophyte complex. Mild-to-moderate bilateral neuroforaminal stenosis. No central canal stenosis. L2-3:  Mild-to-moderate bilateral facet joint hypertrophy. Mild broad-based posterior disc osteophyte complex. Mild narrowing of the bilateral neural foramina without true stenosis. No central canal stenosis. L3-4: Mild-to-moderate bilateral facet joint hypertrophy. Moderate broad-based posterior disc osteophyte complex with bilateral intraforaminal extension. Moderate right-greater-than-left neuroforaminal stenosis. Moderate narrowing of the lateral recesses. Prominent epidural fat around the thecal sac. Mild-to-moderate central canal stenosis. L4-5: Moderate left and mild right facet joint hypertrophy. Mild-to-moderate broad-based posterior disc osteophyte complex with left greater than right intraforaminal extension. Moderate to severe left and mild-to-moderate right neuroforaminal stenosis. Mild narrowing of the lateral recesses. Prominent epidural fat around the thecal sac. Overall mild left-greater-than-right central canal stenosis. L5-S1: Moderate left and mild right facet joint hypertrophy. Mild broad-based posterior disc bulge. Mild left-greater-than-right neuroforaminal stenosis. No central canal stenosis. IMPRESSION: 1. Transitional lumbosacral anatomy. There are considered to be 13 thoracic vertebral bodies with segmental transverse processes but no ribs at "T13." Recommend correlation with this numbering nomenclature prior to any image guided invasive procedure. 2. Findings are suspicious for right-greater-than-left L3-4 discitis/osteomyelitis including right-greater-than-left endplate marrow edema, endplate irregularity, and fluid within the disc space. No definite paraspinal or epidural abscess is seen. There does appear to be inflammatory edema within the adjacent right psoas muscle. 3. Multilevel degenerative disc and joint changes as above. Mild-to-moderate L3-4 and mild left-greater-than-right L4-5 central canal stenosis. 4. Multilevel neuroforaminal stenosis including moderate right-greater-than-left  L3-4 and moderate to severe left and mild-to-moderate right L4-5 neuroforaminal stenosis. Critical Value/emergent results were called by telephone at the time of interpretation on 12/26/2021 at 5:58 pm to provider [facility] , who verbally acknowledged these results. Electronically Signed   By: Yvonne Kendall M.D.   On: 12/26/2021 17:58   DG Lumbar Spine Complete  Result Date: 12/26/2021 CLINICAL DATA:  Back pain for 1 week, no known injury EXAM: LUMBAR SPINE - COMPLETE 4+ VIEW COMPARISON:  None Available. FINDINGS: No fracture or dislocation of the lumbar spine. Moderate to severe disc space height loss and osteophytosis of the lower lumbar levels, worst from L3 through S1. Otherwise mild disc space height loss and osteophytosis. Moderate to severe facet degenerative change at the lower lumbar levels. Nonobstructive pattern of overlying bowel gas. IMPRESSION: 1.  No fracture or dislocation of the lumbar spine. 2. Moderate to severe disc and facet degenerative disease of the lower lumbar levels, worst from L3 through S1. Lumbar disc and neural foraminal pathology may be further evaluated by MRI if indicated by neurologically localizing signs and symptoms. Electronically Signed   By: Delanna Ahmadi M.D.   On: 12/26/2021 14:20     Assessment/Plan:  new diagnosed discitis: -will get second set of blood cx - agree with neurosurgery that patient should get ir aspiration of l3-l4 disc space and sent for culture. He is clinically stable will discuss discontinuing abtx to increase yield of aspirate. - if blood cx are positive, he will need TEE due to recent MV clipping surgery 2-3 wk ago. Severe neuropathic pain = will increase to neurontin '300mg'$  tid to see if better control of pain - will check sed rate and crp

## 2021-12-28 ENCOUNTER — Encounter (HOSPITAL_COMMUNITY): Payer: Self-pay | Admitting: Internal Medicine

## 2021-12-28 DIAGNOSIS — M4646 Discitis, unspecified, lumbar region: Secondary | ICD-10-CM | POA: Diagnosis not present

## 2021-12-28 LAB — CBC
HCT: 45.6 % (ref 39.0–52.0)
Hemoglobin: 14.4 g/dL (ref 13.0–17.0)
MCH: 30.5 pg (ref 26.0–34.0)
MCHC: 31.6 g/dL (ref 30.0–36.0)
MCV: 96.6 fL (ref 80.0–100.0)
Platelets: 251 10*3/uL (ref 150–400)
RBC: 4.72 MIL/uL (ref 4.22–5.81)
RDW: 18.6 % — ABNORMAL HIGH (ref 11.5–15.5)
WBC: 13.7 10*3/uL — ABNORMAL HIGH (ref 4.0–10.5)
nRBC: 0 % (ref 0.0–0.2)

## 2021-12-28 LAB — BASIC METABOLIC PANEL
Anion gap: 13 (ref 5–15)
BUN: 53 mg/dL — ABNORMAL HIGH (ref 8–23)
CO2: 25 mmol/L (ref 22–32)
Calcium: 8.1 mg/dL — ABNORMAL LOW (ref 8.9–10.3)
Chloride: 101 mmol/L (ref 98–111)
Creatinine, Ser: 2.8 mg/dL — ABNORMAL HIGH (ref 0.61–1.24)
GFR, Estimated: 23 mL/min — ABNORMAL LOW (ref >60)
Glucose, Bld: 127 mg/dL — ABNORMAL HIGH (ref 70–99)
Potassium: 4.1 mmol/L (ref 3.5–5.1)
Sodium: 139 mmol/L (ref 135–145)

## 2021-12-28 LAB — MAGNESIUM: Magnesium: 2.4 mg/dL (ref 1.7–2.4)

## 2021-12-28 NOTE — Progress Notes (Signed)
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  X-ray was negative, MRI showed concerns of discitis/osteomyelitis of L3-L4 region.  Neurosurgery was consulted who recommended IR consultation for drainage and culture if they can get any fluid.   Assessment & Plan:  Principal Problem:   Discitis of lumbar region Active Problems:   Atrial fibrillation (HCC)   Alcoholism (HCC)   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HCC)   Myeloproliferative neoplasm (HCC)   COPD (chronic obstructive pulmonary disease) (HCC)   PAD (peripheral artery disease) (HCC)   Aortic atherosclerosis (HCC)   CAD S/P percutaneous coronary angioplasty   S/P mitral valve clip implantation   Lower back pain secondary to acute osteomyelitis/discitis of L3-L4 region -MRI shows evidence of osteomyelitis/discitis - IR consulted Dr Vernard Gambles, planned for procedure on Monday morning. Eliquis on hold in the meantime.  - Antibiotics-IV vancomycin and Zosyn- currently on hold until the biopsy/aspirate cx.  - Seen by neurosurgery.  No planned intervention - ID following.   Congestive heart failure with reduced ejection fraction, 35% - Patient appears to be compensated right now.  Continue Toprol-XL. Hold Farxiga, Aldactone, torsemide- on hold due to AKI.   AKI on CKD stage IIIb - Baseline creatinine around 2.2.  Cr today 2.8- will hold Aldactone, Farxiga and Torsemide.   CAD status post PCI to mid LAD 09/2020 - Currently chest pain-free.  On Eliquis and statin.  Severe mitral regurgitation status post TAVR/MitraClip Severe  TR/aortic insufficiency - Patient underwent MitraClip placement 11/2021.  Currently on Eliquis (on hold).  Not on aspirin and Plavix - Tricuspid and aortic valve under surveillance by cardiology Spoke with Dr Stanford Breed from cardiology, notified ptn Is admitted- ok with holding AC if needed for the procedure. If his Cultures are positive, then he will need TEE. Appreciate his recs.   Persistent atrial fibrillation/flutter status post ablation  History of COPD - Bronchodilators.  I-S/flutter valve.   DVT prophylaxis: Eliquis Code Status: Full Code Family Communication:  [name] updated periodically.   Status is: Inpatient Remains inpatient appropriate because: Hospital stay for IV Abx after IR biopsy. Likely will be here for atleast 3-5 days    Subjective: Feels ok but continues to have back pain.   Examination: Constitutional: Not in acute distress Respiratory: Clear to auscultation bilaterally Cardiovascular: Normal sinus rhythm, no rubs Abdomen: Nontender nondistended good bowel sounds Musculoskeletal: No edema noted Skin: No rashes seen Neurologic: CN 2-12 grossly intact.  And nonfocal Psychiatric: Normal judgment and insight. Alert and oriented x 3. Normal mood.     Objective: Vitals:   12/27/21 1128 12/27/21 1624 12/27/21 1629 12/27/21 2142  BP:  (!) 102/52  (!) 123/93  Pulse: 81 79  73  Resp: (!) '21 18  18  '$ Temp:  100 F (37.8 C) 98.6 F (37 C) 98.8 F (37.1 C)  TempSrc:  Oral Oral Oral  SpO2: 96% 91%  92%  Weight:      Height:        Intake/Output Summary (Last 24 hours) at 12/28/2021 0809 Last data filed at 12/28/2021 0437 Gross per 24 hour  Intake 1364.93 ml  Output 1700 ml  Net -335.07 ml   Filed Weights   12/26/21 1300 12/26/21 2114 12/27/21 0505  Weight: 92.1 kg 93.3 kg 93.3 kg     Data Reviewed:   CBC: Recent Labs  Lab 12/26/21 1512 12/27/21 0055 12/28/21 0550  WBC 13.4* 11.9* 13.7*  NEUTROABS 10.6*  --   --   HGB 16.0  16.7 15.1 14.4   HCT 50.7  49.0 48.2 45.6  MCV 96.0 97.2 96.6  PLT 276 236 818   Basic Metabolic Panel: Recent Labs  Lab 12/26/21 1449 12/26/21 1512 12/27/21 0055 12/28/21 0550  NA 139 139 140 139  K 4.3 4.2 4.4 4.1  CL 98 98 100 101  CO2 30  --  30 25  GLUCOSE 126* 127* 116* 127*  BUN 41* 40* 44* 53*  CREATININE 2.14* 2.40* 2.12* 2.80*  CALCIUM 8.7*  --  8.3* 8.1*  MG  --   --   --  2.4   GFR: Estimated Creatinine Clearance: 24.8 mL/min (A) (by C-G formula based on SCr of 2.8 mg/dL (H)). Liver Function Tests: Recent Labs  Lab 12/27/21 0055  AST 20  ALT 26  ALKPHOS 116  BILITOT 0.9  PROT 7.0  ALBUMIN 3.0*   No results for input(s): "LIPASE", "AMYLASE" in the last 168 hours. No results for input(s): "AMMONIA" in the last 168 hours. Coagulation Profile: Recent Labs  Lab 12/27/21 0055  INR 1.1   Cardiac Enzymes: No results for input(s): "CKTOTAL", "CKMB", "CKMBINDEX", "TROPONINI" in the last 168 hours. BNP (last 3 results) No results for input(s): "PROBNP" in the last 8760 hours. HbA1C: No results for input(s): "HGBA1C" in the last 72 hours. CBG: No results for input(s): "GLUCAP" in the last 168 hours. Lipid Profile: No results for input(s): "CHOL", "HDL", "LDLCALC", "TRIG", "CHOLHDL", "LDLDIRECT" in the last 72 hours. Thyroid Function Tests: No results for input(s): "TSH", "T4TOTAL", "FREET4", "T3FREE", "THYROIDAB" in the last 72 hours. Anemia Panel: No results for input(s): "VITAMINB12", "FOLATE", "FERRITIN", "TIBC", "IRON", "RETICCTPCT" in the last 72 hours. Sepsis Labs: Recent Labs  Lab 12/26/21 2207 12/27/21 0055  PROCALCITON <0.10  --   LATICACIDVEN 1.5 1.6    Recent Results (from the past 240 hour(s))  MRSA Next Gen by PCR, Nasal     Status: Abnormal   Collection Time: 12/26/21 10:26 PM   Specimen: Nasal Mucosa; Nasal Swab  Result Value Ref Range Status   MRSA by PCR Next Gen DETECTED (A) NOT DETECTED Final    Comment: RESULT CALLED TO, READ BACK BY AND  VERIFIED WITH: Lear Ng, RN 12/27/2021 @ 93 [facility] (NOTE) The GeneXpert MRSA Assay (FDA approved for NASAL specimens only), is one component of a comprehensive MRSA colonization surveillance program. It is not intended to diagnose MRSA infection nor to guide or monitor treatment for MRSA infections. Test performance is not FDA approved in patients less than 29 years old. Performed at [facility], [place] [address], [place], [place] [address]   Culture, blood (Routine X 2) w Reflex to ID Panel     Status: None (Preliminary result)   Collection Time: 12/27/21 12:55 AM   Specimen: BLOOD  Result Value Ref Range Status   Specimen Description   Final    BLOOD BLOOD RIGHT HAND Performed at [facility] [address] [place], [place] [address]    Special Requests   Final    BOTTLES DRAWN AEROBIC ONLY Blood Culture results may not be optimal due to an inadequate  volume of blood received in culture bottles Performed at [facility] [address], [place], [place] [address]    Culture   Final    NO GROWTH < 12 HOURS Performed at [facility] [address] [place], [place] [address]    Report Status PENDING  Incomplete  Culture, blood (Routine X 2) w Reflex to ID Panel     Status: None (Preliminary result)   Collection Time: 12/27/21 12:55 AM   Specimen: BLOOD  Result Value Ref Range Status   Specimen Description   Final    BLOOD RIGHT ANTECUBITAL Performed at [facility] [address], [place], [place] [address]    Special Requests   Final    BOTTLES DRAWN AEROBIC ONLY Blood Culture adequate volume Performed at [facility] [address], [place], [place] [address]    Culture   Final    NO GROWTH < 12 HOURS Performed at [facility] [address], [place], [place] [address]    Report Status PENDING  Incomplete         Radiology Studies: MR LUMBAR SPINE WO  CONTRAST  Result Date: 12/26/2021 CLINICAL DATA:  Lower back pain. Symptoms persist with 6 weeks treatment. Bilateral leg pain. EXAM: MRI LUMBAR SPINE WITHOUT CONTRAST TECHNIQUE: Multiplanar, multisequence MR imaging of the lumbar spine was performed. No intravenous contrast was administered. COMPARISON:  Lumbar spine radiographs 12/26/2021 FINDINGS: Segmentation: There is transitional lumbosacral anatomy. Correlating with prior 09/29/2021 CT there appear to be 12 rib-bearing thoracic type vertebral bodies. The next vertebral body is transitional with segmented transverse processes but no ribs. This is considered T13. Distal to this, the next 5 vertebral bodies are considered L1 through L5. Axial series 8 image 9 corresponds to the T13-L1 disc level, image 16 corresponds to the L1-2 disc level, and image 37 corresponds to the L5-S1 disc level. There is a transitional right L5-S1 assimilation joint with associated partially visualized degenerative change and marrow edema (sagittal series 7, image 1/17 and axial image 35/41). Alignment:  2 mm retrolisthesis of L1 on L2. Vertebrae: Vertebral body heights are maintained. Severe right-greater-than-left L3-4 diffuse severe L4-5, and moderate right-greater-than-left L2-3 and mild right-greater-than-left L1-2 disc space narrowing. Decreased disc hydration is greatest at L4-5. There is mild-to-moderate endplate irregularity of the right-greater-than-left L3-4 disc space with moderate marrow edema and mild chronic fat intensity marrow endplate degenerative changes at the right L3-4 disc space. There is mild fluid throughout the L3-4 disc space raising concern for discitis/osteomyelitis. Conus medullaris and cauda equina: Conus extends to the mid to inferior T12 level. Conus and cauda equina appear normal. Paraspinal and other soft tissues: At the area of the right L3-4 disc space concerning for discitis/osteomyelitis there is mild edema within the adjacent right psoas muscle  (axial series 8 image 28 and sagittal series 7, image 2) further raising suspicion for infected disc. The kidneys are partially visualized. There are multiple small decreased T1 and increased T2 signal cyst within the upper pole of the right kidney. Disc levels: T11-12 and T12-L1: Unremarkable. L1-2: Mild-to-moderate bilateral facet joint hypertrophy. Mild broad-based posterior disc osteophyte complex. Mild-to-moderate bilateral neuroforaminal stenosis. No central canal stenosis. L2-3: Mild-to-moderate bilateral facet joint hypertrophy. Mild broad-based posterior disc osteophyte complex. Mild narrowing of the bilateral neural foramina without true stenosis. No central canal stenosis. L3-4: Mild-to-moderate bilateral facet joint hypertrophy. Moderate broad-based posterior disc osteophyte complex with bilateral intraforaminal extension. Moderate right-greater-than-left neuroforaminal stenosis. Moderate narrowing  of the lateral recesses. Prominent epidural fat around the thecal sac. Mild-to-moderate central canal stenosis. L4-5: Moderate left and mild right facet joint hypertrophy. Mild-to-moderate broad-based posterior disc osteophyte complex with left greater than right intraforaminal extension. Moderate to severe left and mild-to-moderate right neuroforaminal stenosis. Mild narrowing of the lateral recesses. Prominent epidural fat around the thecal sac. Overall mild left-greater-than-right central canal stenosis. L5-S1: Moderate left and mild right facet joint hypertrophy. Mild broad-based posterior disc bulge. Mild left-greater-than-right neuroforaminal stenosis. No central canal stenosis. IMPRESSION: 1. Transitional lumbosacral anatomy. There are considered to be 13 thoracic vertebral bodies with segmental transverse processes but no ribs at "T13." Recommend correlation with this numbering nomenclature prior to any image guided invasive procedure. 2. Findings are suspicious for right-greater-than-left L3-4  discitis/osteomyelitis including right-greater-than-left endplate marrow edema, endplate irregularity, and fluid within the disc space. No definite paraspinal or epidural abscess is seen. There does appear to be inflammatory edema within the adjacent right psoas muscle. 3. Multilevel degenerative disc and joint changes as above. Mild-to-moderate L3-4 and mild left-greater-than-right L4-5 central canal stenosis. 4. Multilevel neuroforaminal stenosis including moderate right-greater-than-left L3-4 and moderate to severe left and mild-to-moderate right L4-5 neuroforaminal stenosis. Critical Value/emergent results were called by telephone at the time of interpretation on 12/26/2021 at 5:58 pm to provider [facility] , who verbally acknowledged these results. Electronically Signed   By: Yvonne Kendall M.D.   On: 12/26/2021 17:58   DG Lumbar Spine Complete  Result Date: 12/26/2021 CLINICAL DATA:  Back pain for 1 week, no known injury EXAM: LUMBAR SPINE - COMPLETE 4+ VIEW COMPARISON:  None Available. FINDINGS: No fracture or dislocation of the lumbar spine. Moderate to severe disc space height loss and osteophytosis of the lower lumbar levels, worst from L3 through S1. Otherwise mild disc space height loss and osteophytosis. Moderate to severe facet degenerative change at the lower lumbar levels. Nonobstructive pattern of overlying bowel gas. IMPRESSION: 1.  No fracture or dislocation of the lumbar spine. 2. Moderate to severe disc and facet degenerative disease of the lower lumbar levels, worst from L3 through S1. Lumbar disc and neural foraminal pathology may be further evaluated by MRI if indicated by neurologically localizing signs and symptoms. Electronically Signed   By: Delanna Ahmadi M.D.   On: 12/26/2021 14:20        Scheduled Meds:  amiodarone  200 mg Oral Daily   atorvastatin  80 mg Oral Daily   buprenorphine-naloxone  1 tablet Sublingual Daily   dapagliflozin propanediol  10 mg Oral QAC breakfast    docusate sodium  100 mg Oral BID   enoxaparin (LOVENOX) injection  40 mg Subcutaneous Daily   fluticasone furoate-vilanterol  1 puff Inhalation Daily   gabapentin  300 mg Oral TID   metoprolol succinate  12.5 mg Oral Daily   potassium chloride SA  20 mEq Oral Daily   sodium chloride flush  3 mL Intravenous Q12H   spironolactone  12.5 mg Oral Daily   torsemide  60 mg Oral BID   Continuous Infusions:  sodium chloride 50 mL/hr at 12/28/21 0437     LOS: 2 days   Time spent= 35 mins    Jerzy Crotteau Kaycen Loader, MD [facility]  If 7PM-7AM, please contact night-coverage  12/28/2021, 8:09 AM

## 2021-12-28 NOTE — Consult Note (Signed)
Chief Complaint: Patient was seen in consultation today for lowe back pain and possible L3-4 discitis/osteomyelitis  Chief Complaint  Patient presents with   Back Pain   at the request of Reesa Chew, Ankit   Referring Physician(s): Reesa Chew, Ankit   Supervising Physician: Arne Cleveland  Patient Status: [facility] - In-pt  History of Present Illness: [name] is a 76 y.o. 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   Patient was found to have leukocytosis, MR L spine on 6/9 showed features suspicious for right-greater-than-left L3-4 discitis/osteomyelitis. Patient was admitted for further eval and management, ID has been consulted. A disc aspiration was recommended to the patient for further evaluation and management of L3-4 discitis/osteomyelitis, after thorough discussion and shared decision making, patient decided to proceed.   IR was requested for L3-4 disc aspiration.  Case was reviewed and approved by Dr. Vernard Gambles.   Patient laying in bed, not in acute distress.  Reports that he is doing much better than yesterday, his back pain is better as well. [name] headache, fever, chills, shortness of breath, cough, chest pain, abdominal pain, nausea ,vomiting, and bleeding.   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 Ali Lowe   Severe mitral regurgitation    Substance abuse ([place])    History of alcohol; hx cocaine, heroin, crack use   Syncope    Tubular adenoma of colon 08/2014    Past Surgical History:  Procedure Laterality Date   [place] AND ABLATION  08/2010   [name] 09/07/2010 (12/14/2012)   CARDIAC SURGERY     CARDIOVERSION N/A 10/16/2021   Procedure: CARDIOVERSION;  Surgeon: Larey Dresser, MD;  Location: [facility];  Service: Cardiovascular;  Laterality: N/A;   COLONOSCOPY     15-20 years ago had colon in [place] N/A 02/12/2020   Procedure: COLONOSCOPY WITH PROPOFOL;  Surgeon: Ladene Artist, MD;  Location: [facility];  Service: Endoscopy;  Laterality: N/A;   CORONARY STENT INTERVENTION N/A 10/10/2020   Procedure: CORONARY STENT INTERVENTION;  Surgeon: Lorretta Harp, MD;  Location: [place] [facility];  Service: Cardiovascular;  Laterality: N/A;   EXCISIONAL HEMORRHOIDECTOMY  1970's   LOOP RECORDER IMPLANT N/A 08/23/2013   Procedure: LOOP RECORDER IMPLANT;  Surgeon: Deboraha Sprang, MD;  Location: [facility] CATH LAB;  Service: Cardiovascular;  Laterality: N/A;   MITRAL VALVE  REPAIR N/A 11/20/2021   Procedure: MITRAL VALVE REPAIR;  Surgeon: [name], MD;  Location: [place] [facility];  Service: Cardiovascular;  Laterality: N/A;   MULTIPLE EXTRACTIONS WITH ALVEOLOPLASTY Bilateral 01/24/2016   Procedure: MULTIPLE EXTRACTION WITH ALVEOLOPLASTY BILATERAL;  Surgeon: Diona Browner, DDS;  Location: [facility];  Service: Oral Surgery;  Laterality: Bilateral;   MULTIPLE TOOTH EXTRACTIONS  01/24/2016   MULTIPLE EXTRACTION WITH ALVEOLOPLASTY  BILATERAL (Bilateral)   POLYPECTOMY  02/12/2020   Procedure: POLYPECTOMY;  Surgeon: Ladene Artist, MD;  Location: [facility];  Service: Endoscopy;;   RIGHT HEART CATH N/A 10/03/2021   Procedure: RIGHT HEART CATH;  Surgeon: [name], MD;  Location: [place] [facility];  Service: Cardiovascular;  Laterality: N/A;   RIGHT/LEFT HEART CATH AND CORONARY ANGIOGRAPHY N/A 10/09/2020   Procedure: RIGHT/LEFT HEART CATH AND CORONARY ANGIOGRAPHY;  Surgeon: Burnell Blanks, MD;  Location: [place] [facility];  Service: Cardiovascular;  Laterality: N/A;   TEE WITHOUT CARDIOVERSION N/A 10/16/2021   Procedure: TRANSESOPHAGEAL ECHOCARDIOGRAM (TEE);  Surgeon: Larey Dresser, MD;  Location: [facility] ENDOSCOPY;  Service: Cardiovascular;  Laterality: N/A;   TEE WITHOUT CARDIOVERSION N/A 11/07/2021   Procedure: TRANSESOPHAGEAL ECHOCARDIOGRAM (TEE);  Surgeon: Werner Lean, MD;  Location: [facility] ENDOSCOPY;  Service: Cardiovascular;  Laterality: N/A;   TEE WITHOUT CARDIOVERSION N/A 11/20/2021   Procedure: TRANSESOPHAGEAL ECHOCARDIOGRAM (TEE);  Surgeon: [name], MD;  Location: [place] [facility] LAB;  Service: Cardiovascular;  Laterality: N/A;    Allergies: Patient has no known allergies.  Medications: Prior to Admission medications   Medication Sig Start Date End Date Taking? Authorizing Provider  acetaminophen (TYLENOL) 500 MG tablet Take 2 tablets (1,000 mg total) by mouth every 6 (six) hours as needed for moderate pain. 12/26/21  Yes [name], PA-C  albuterol [facility] HFA) [phone] Base) MCG/ACT inhaler Inhale 1-2 puffs into the lungs every 6 (six) hours as needed for wheezing or shortness of breath. 01/11/19  Yes [name], MD  amiodarone (PACERONE) 200 MG tablet Take 1 tablet (200 mg total) by mouth daily. 10/23/21  Yes Larey Dresser, MD  apixaban (ELIQUIS) 5 MG TABS tablet Take 1 tablet (5 mg total) by mouth 2 (two) times daily. 04/26/21  Yes Jose Persia, MD  atorvastatin  (LIPITOR) 80 MG tablet Take 1 tablet (80 mg total) by mouth daily. 04/26/21  Yes Jose Persia, MD  cyclobenzaprine (FLEXERIL) 10 MG tablet Take 1 tablet (10 mg total) by mouth 2 (two) times daily as needed for muscle spasms. 12/26/21  Yes [name], PA-C  dapagliflozin propanediol (FARXIGA) 10 MG TABS tablet Take 1 tablet (10 mg total) by mouth daily before breakfast. 09/09/21  Yes [name], PA-C  diclofenac Sodium (VOLTAREN) 1 % GEL Apply 2 g topically 4 (four) times daily. Apply 2g topically to lower back 4 times daily as needed for back pain 12/26/21  Yes [name], PA-C  gabapentin (NEURONTIN) 100 MG capsule Take 1 capsule (100 mg total) by mouth 2 (two) times daily. 12/23/21  Yes [name], MD  metoprolol succinate (TOPROL-XL) 25 MG 24 hr tablet Take 0.5 tablets (12.5 mg total) by mouth daily. 11/21/21  Yes Kathyrn Drown D, NP  potassium chloride SA (KLOR-CON M) 20 MEQ tablet Take 1 tablet (20 mEq total) by mouth daily. 11/21/21  Yes [name], MD  SYMBICORT 160-4.5 MCG/ACT inhaler Inhale 2 puffs into the lungs daily as needed (SOB). 07/02/21  Yes [provider]  camphor-menthol (  SARNA) lotion Apply topically 3 (three) times daily. Patient not taking: Reported on 12/27/2021 02/13/19   Kayleen Memos, DO  hydrocortisone cream 1 % Apply 1 application topically 2 (two) times daily. Patient not taking: Reported on 12/27/2021 09/04/21   [name], MD  senna-docusate (SENOKOT-S) 8.6-50 MG tablet Take 1 tablet by mouth at bedtime as needed for mild constipation. Patient not taking: Reported on 12/27/2021 08/31/21   [name], MD  spironolactone (ALDACTONE) 25 MG tablet Take 0.5 tablets (12.5 mg total) by mouth daily. 10/23/21   Larey Dresser, MD  torsemide (DEMADEX) 20 MG tablet Take 3 tablets (60 mg total) by mouth 2 (two) times daily. 10/23/21   Larey Dresser, MD     Family History  Problem Relation Age of Onset   Diabetes Mother    Hypertension Mother     Cirrhosis Father    Alcohol abuse Father    Colon cancer Neg Hx    Rectal cancer Neg Hx    Stomach cancer Neg Hx    Esophageal cancer Neg Hx    Colon polyps Neg Hx     Social History   Socioeconomic History   Marital status: Unknown    Spouse name: [name]   Number of children: 1   Years of education: Not on file   Highest education level: 11th grade  Occupational History   Occupation: Retired  Tobacco Use   Smoking status: Former    Packs/day: 2.00    Years: 50.00    Total pack years: 100.00    Types: Cigarettes    Quit date: 11/25/2021    Years since quitting: 0.0   Smokeless tobacco: Never   Tobacco comments:    1-2 cigarettes a day for a couple years now. encouraged cessation  Vaping Use   Vaping Use: Never used  Substance and Sexual Activity   Alcohol use: Yes    Alcohol/week: 2.0 standard drinks of alcohol    Types: 2 Shots of liquor per week   Drug use: Not Currently    Types: Heroin    Comment: stopped use in 2015. currently on suboxone   Sexual activity: Not Currently  Other Topics Concern   Not on file  Social History Narrative   ** Merged History Encounter **       Moved here from [place]. Lives with common law wife and grandchildren. He is retired from "general labor."   Social Determinants of Radio broadcast assistant Strain: Low Risk  (10/11/2020)   Overall Financial Resource Strain (CARDIA)    Difficulty of Paying Living Expenses: Not hard at all  Food Insecurity: No Food Insecurity (10/11/2020)   Hunger Vital Sign    Worried About Running Out of Food in the Last Year: Never true    Ran Out of Food in the Last Year: Never true  Transportation Needs: Unmet Transportation Needs (11/14/2021)   PRAPARE - Hydrologist (Medical): Yes    Lack of Transportation (Non-Medical): Yes  Physical Activity: Not on file  Stress: Not on file  Social Connections: Not on file     Review of Systems: A 12 point ROS discussed and  pertinent positives are indicated in the HPI above.  All other systems are negative.  Vital Signs: BP (!) 113/52   Pulse 88   Temp 98.8 F (37.1 C) (Oral)   Resp 18   Ht '5\' 6"'$  (1.676 m)   Wt 205 lb 11 oz (93.3  kg)   SpO2 92%   BMI 33.20 kg/m   Physical Exam Vitals and nursing note reviewed.  Constitutional:      General: Patient is not in acute distress.    Appearance: Normal appearance. Patient is not ill-appearing.  HENT:     Head: Normocephalic and atraumatic.     Mouth/Throat:     Mouth: Mucous membranes are moist.     Pharynx: Oropharynx is clear.  Cardiovascular:     Rate and Rhythm: Normal rate and regular rhythm.     Pulses: Normal pulses.     Heart sounds: Normal heart sounds.  Pulmonary:     Effort: Pulmonary effort is normal.     Breath sounds: Normal breath sounds.  Abdominal:     General: Abdomen is flat. Bowel sounds are normal.     Palpations: Abdomen is soft.  Musculoskeletal:     Cervical back: Neck supple.  Skin:    General: Skin is warm and dry.     Coloration: Skin is not jaundiced or pale.  Neurological:     Mental Status: Patient is alert and oriented to person, place, and time.  Psychiatric:        Mood and Affect: Mood normal.        Behavior: Behavior normal.        Judgment: Judgment normal.     MD Evaluation Airway: WNL Heart: WNL Abdomen: WNL Chest/ Lungs: WNL ASA  Classification: 3 Mallampati/Airway Score: Two  Imaging: MR LUMBAR SPINE WO CONTRAST  Result Date: 12/26/2021 CLINICAL DATA:  Lower back pain. Symptoms persist with 6 weeks treatment. Bilateral leg pain. EXAM: MRI LUMBAR SPINE WITHOUT CONTRAST TECHNIQUE: Multiplanar, multisequence MR imaging of the lumbar spine was performed. No intravenous contrast was administered. COMPARISON:  Lumbar spine radiographs 12/26/2021 FINDINGS: Segmentation: There is transitional lumbosacral anatomy. Correlating with prior 09/29/2021 CT there appear to be 12 rib-bearing thoracic type  vertebral bodies. The next vertebral body is transitional with segmented transverse processes but no ribs. This is considered T13. Distal to this, the next 5 vertebral bodies are considered L1 through L5. Axial series 8 image 9 corresponds to the T13-L1 disc level, image 16 corresponds to the L1-2 disc level, and image 37 corresponds to the L5-S1 disc level. There is a transitional right L5-S1 assimilation joint with associated partially visualized degenerative change and marrow edema (sagittal series 7, image 1/17 and axial image 35/41). Alignment:  2 mm retrolisthesis of L1 on L2. Vertebrae: Vertebral body heights are maintained. Severe right-greater-than-left L3-4 diffuse severe L4-5, and moderate right-greater-than-left L2-3 and mild right-greater-than-left L1-2 disc space narrowing. Decreased disc hydration is greatest at L4-5. There is mild-to-moderate endplate irregularity of the right-greater-than-left L3-4 disc space with moderate marrow edema and mild chronic fat intensity marrow endplate degenerative changes at the right L3-4 disc space. There is mild fluid throughout the L3-4 disc space raising concern for discitis/osteomyelitis. Conus medullaris and cauda equina: Conus extends to the mid to inferior T12 level. Conus and cauda equina appear normal. Paraspinal and other soft tissues: At the area of the right L3-4 disc space concerning for discitis/osteomyelitis there is mild edema within the adjacent right psoas muscle (axial series 8 image 28 and sagittal series 7, image 2) further raising suspicion for infected disc. The kidneys are partially visualized. There are multiple small decreased T1 and increased T2 signal cyst within the upper pole of the right kidney. Disc levels: T11-12 and T12-L1: Unremarkable. L1-2: Mild-to-moderate bilateral facet joint hypertrophy. Mild broad-based posterior disc  osteophyte complex. Mild-to-moderate bilateral neuroforaminal stenosis. No central canal stenosis. L2-3:  Mild-to-moderate bilateral facet joint hypertrophy. Mild broad-based posterior disc osteophyte complex. Mild narrowing of the bilateral neural foramina without true stenosis. No central canal stenosis. L3-4: Mild-to-moderate bilateral facet joint hypertrophy. Moderate broad-based posterior disc osteophyte complex with bilateral intraforaminal extension. Moderate right-greater-than-left neuroforaminal stenosis. Moderate narrowing of the lateral recesses. Prominent epidural fat around the thecal sac. Mild-to-moderate central canal stenosis. L4-5: Moderate left and mild right facet joint hypertrophy. Mild-to-moderate broad-based posterior disc osteophyte complex with left greater than right intraforaminal extension. Moderate to severe left and mild-to-moderate right neuroforaminal stenosis. Mild narrowing of the lateral recesses. Prominent epidural fat around the thecal sac. Overall mild left-greater-than-right central canal stenosis. L5-S1: Moderate left and mild right facet joint hypertrophy. Mild broad-based posterior disc bulge. Mild left-greater-than-right neuroforaminal stenosis. No central canal stenosis. IMPRESSION: 1. Transitional lumbosacral anatomy. There are considered to be 13 thoracic vertebral bodies with segmental transverse processes but no ribs at "T13." Recommend correlation with this numbering nomenclature prior to any image guided invasive procedure. 2. Findings are suspicious for right-greater-than-left L3-4 discitis/osteomyelitis including right-greater-than-left endplate marrow edema, endplate irregularity, and fluid within the disc space. No definite paraspinal or epidural abscess is seen. There does appear to be inflammatory edema within the adjacent right psoas muscle. 3. Multilevel degenerative disc and joint changes as above. Mild-to-moderate L3-4 and mild left-greater-than-right L4-5 central canal stenosis. 4. Multilevel neuroforaminal stenosis including moderate right-greater-than-left  L3-4 and moderate to severe left and mild-to-moderate right L4-5 neuroforaminal stenosis. Critical Value/emergent results were called by telephone at the time of interpretation on 12/26/2021 at 5:58 pm to provider [facility] , who verbally acknowledged these results. Electronically Signed   By: Yvonne Kendall M.D.   On: 12/26/2021 17:58   DG Lumbar Spine Complete  Result Date: 12/26/2021 CLINICAL DATA:  Back pain for 1 week, no known injury EXAM: LUMBAR SPINE - COMPLETE 4+ VIEW COMPARISON:  None Available. FINDINGS: No fracture or dislocation of the lumbar spine. Moderate to severe disc space height loss and osteophytosis of the lower lumbar levels, worst from L3 through S1. Otherwise mild disc space height loss and osteophytosis. Moderate to severe facet degenerative change at the lower lumbar levels. Nonobstructive pattern of overlying bowel gas. IMPRESSION: 1.  No fracture or dislocation of the lumbar spine. 2. Moderate to severe disc and facet degenerative disease of the lower lumbar levels, worst from L3 through S1. Lumbar disc and neural foraminal pathology may be further evaluated by MRI if indicated by neurologically localizing signs and symptoms. Electronically Signed   By: Delanna Ahmadi M.D.   On: 12/26/2021 14:20   ECHOCARDIOGRAM COMPLETE  Result Date: 12/22/2021    ECHOCARDIOGRAM REPORT   Patient Name:   [name] Date of Exam: 12/22/2021 Medical Rec #:  [id number]                Height:       66.0 in Accession #:    [id number]               Weight:       198.6 lb Date of Birth:  1945-09-16               BSA:          1.994 m Patient Age:    61 years                 BP:  137/70 mmHg Patient Gender: M                        HR:           65 bpm. Exam Location:  [address] Procedure: 2D Echo, Cardiac Doppler and Color Doppler Indications:    Z98.890 s/p mitral valve clip  History:        Patient has prior history of Echocardiogram examinations, most                 recent  11/21/2021. CHF, CAD and NSTEMI, COPD and CKD,                 Arrythmias:Atrial Flutter, Signs/Symptoms:Dyspnea; Risk                 Factors:Hypertension and Current Smoker. Clip1 XTW clip located                 at A2-P2.  Sonographer:    Marygrace Drought RCS Referring Phys: [place]  1. Left ventricular ejection fraction, by estimation, is 55 to 60%. The left ventricle has normal function. The left ventricle has no regional wall motion abnormalities. The left ventricular internal cavity size was mildly to moderately dilated. There is mild left ventricular hypertrophy. Left ventricular diastolic parameters are indeterminate.  2. Right ventricular systolic function is normal. The right ventricular size is normal. There is normal pulmonary artery systolic pressure.  3. Left atrial size was mildly dilated.  4. Mitral valve clip in place . [name] The mitral valve has been repaired/replaced. Trivial mitral valve regurgitation. Procedure Date: 11/20/2021.  5. The aortic valve is calcified. Aortic valve regurgitation is moderate.  6. Aortic dilatation noted. There is moderate dilatation of the ascending aorta, measuring 50 mm. FINDINGS  Left Ventricle: Left ventricular ejection fraction, by estimation, is 55 to 60%. The left ventricle has normal function. The left ventricle has no regional wall motion abnormalities. The left ventricular internal cavity size was mildly to moderately dilated. There is mild left ventricular hypertrophy. Left ventricular diastolic parameters are indeterminate. Right Ventricle: The right ventricular size is normal. No increase in right ventricular wall thickness. Right ventricular systolic function is normal. There is normal pulmonary artery systolic pressure. The tricuspid regurgitant velocity is 2.57 m/s, and  with an assumed right atrial pressure of 3 mmHg, the estimated right ventricular systolic pressure is 00.7 mmHg. Left Atrium: Left atrial size was mildly dilated.  Right Atrium: Right atrial size was normal in size. Pericardium: There is no evidence of pericardial effusion. Mitral Valve: Mitral valve clip in place. The mitral valve has been repaired/replaced. Trivial mitral valve regurgitation. There is a Mitra-Clip present in the mitral position. MV peak gradient, 9.9 mmHg. The mean mitral valve gradient is 3.3 mmHg. Tricuspid Valve: The tricuspid valve is normal in structure. Tricuspid valve regurgitation is trivial. Aortic Valve: The aortic valve is calcified. Aortic valve regurgitation is moderate. Aortic regurgitation PHT measures 492 msec. Aortic valve mean gradient measures 11.0 mmHg. Aortic valve peak gradient measures 17.5 mmHg. Aortic valve area, by VTI measures 1.72 cm. Pulmonic Valve: The pulmonic valve was not well visualized. Pulmonic valve regurgitation is not visualized. Aorta: Aortic dilatation noted. There is moderate dilatation of the ascending aorta, measuring 50 mm. IAS/Shunts: The interatrial septum was not well visualized.  LEFT VENTRICLE PLAX 2D LVIDd:         5.30 cm   Diastology LVIDs:  3.80 cm   LV e' medial:    5.11 cm/s LV PW:         1.20 cm   LV E/e' medial:  14.5 LV IVS:        1.20 cm   LV e' lateral:   9.79 cm/s LVOT diam:     2.00 cm   LV E/e' lateral: 7.5 LV SV:         78 LV SV Index:   39 LVOT Area:     3.14 cm  RIGHT VENTRICLE RV Basal diam:  3.30 cm RV S prime:     12.80 cm/s TAPSE (M-mode): 2.6 cm RVSP:           29.4 mmHg LEFT ATRIUM             Index        RIGHT ATRIUM           Index LA diam:        4.40 cm 2.21 cm/m   RA Pressure: 3.00 mmHg LA Vol (A2C):   84.4 ml 42.33 ml/m  RA Area:     15.30 cm LA Vol (A4C):   54.0 ml 27.08 ml/m  RA Volume:   33.80 ml  16.95 ml/m LA Biplane Vol: 66.8 ml 33.50 ml/m  AORTIC VALVE AV Area (Vmax):    1.89 cm AV Area (Vmean):   1.85 cm AV Area (VTI):     1.72 cm AV Vmax:           209.00 cm/s AV Vmean:          156.000 cm/s AV VTI:            0.450 m AV Peak Grad:      17.5 mmHg AV  Mean Grad:      11.0 mmHg LVOT Vmax:         126.00 cm/s LVOT Vmean:        91.800 cm/s LVOT VTI:          0.247 m LVOT/AV VTI ratio: 0.55 AI PHT:            492 msec  AORTA Ao Root diam: 3.40 cm Ao Asc diam:  4.70 cm MITRAL VALVE                TRICUSPID VALVE MV Area (PHT): 2.44 cm     TR Peak grad:   26.4 mmHg MV Area VTI:   1.29 cm     TR Vmax:        257.00 cm/s MV Peak grad:  9.9 mmHg     Estimated RAP:  3.00 mmHg MV Mean grad:  3.3 mmHg     RVSP:           29.4 mmHg MV Vmax:       1.58 m/s MV Vmean:      78.5 cm/s    SHUNTS MV Decel Time: 311 msec     Systemic VTI:  0.25 m MV E velocity: 73.90 cm/s   Systemic Diam: 2.00 cm MV A velocity: 147.00 cm/s MV E/A ratio:  0.50 Mertie Moores MD Electronically signed by Mertie Moores MD Signature Date/Time: 12/22/2021/3:57:16 PM    Final     Labs:  CBC: Recent Labs    11/26/21 0958 12/26/21 1512 12/27/21 0055 12/28/21 0550  WBC 9.8 13.4* 11.9* 13.7*  HGB 15.7 16.0  16.7 15.1 14.4  HCT 47.3 50.7  49.0 48.2 45.6  PLT 286 276 236 251  COAGS: Recent Labs    11/18/21 1200 12/27/21 0055  INR 1.8* 1.1    BMP: Recent Labs    11/21/21 0044 11/26/21 0958 12/26/21 1449 12/26/21 1512 12/27/21 0055 12/28/21 0550  NA 137 142 139 139 140 139  K 5.1 4.3 4.3 4.2 4.4 4.1  CL 100 102 98 98 100 101  CO2 '27 26 30  '$ --  30 25  GLUCOSE 116* 112* 126* 127* 116* 127*  BUN 59* 65* 41* 40* 44* 53*  CALCIUM 8.8* 8.6 8.7*  --  8.3* 8.1*  CREATININE 2.56* 2.40* 2.14* 2.40* 2.12* 2.80*  GFRNONAA 26*  --  32*  --  32* 23*    LIVER FUNCTION TESTS: Recent Labs    08/23/21 0315 10/23/21 1500 11/18/21 1200 12/27/21 0055  BILITOT 0.7 0.6 1.0 0.9  AST 32 '17 31 20  '$ ALT 32 22 35 26  ALKPHOS 173* 130* 121 116  PROT 7.2 6.9 7.3 7.0  ALBUMIN 3.1* 3.1* 3.0* 3.0*    TUMOR MARKERS: No results for input(s): "AFPTM", "CEA", "CA199", "CHROMGRNA" in the last 8760 hours.  Assessment and Plan: 76 y.o. male with L3-4 discitis with back pain.   IR was  requested for image guided L3-4 disc aspiration. Case was reviewed and approved by Dr. Vernard Gambles, tentatively scheduled for Monday 12/29/21 pending IR schedule.   CBC with leukocytosis  INR 1.1 on 12/27/21  On Lovenox as in pt, on Eliquis as out pt. Eliquis has not be given during this admission from 6/9  Risks and benefits of L3-4 disc aspiration was discussed with the patient and/or patient's family including, but not limited to bleeding, infection, damage to adjacent structures or low yield requiring additional tests.  All of the questions were answered and there is agreement to proceed.  Consent signed and in chart.  PLAN - NPO Monday [place] - Lovenox held in [facility] for today and tomorrow. Can be resumed in 6 hrs post procedure    Thank you for this interesting consult.  I greatly enjoyed meeting Aran Menning and look forward to participating in their care.  A copy of this report was sent to the requesting provider on this date.  Electronically Signed: Tera Mater, PA-C 12/28/2021, 10:15 AM   I spent a total of 30 min    in face to face in clinical consultation, greater than 50% of which was counseling/coordinating care for L3-4 disc aspiration.   This chart was dictated using voice recognition software.  Despite best efforts to proofread,  errors can occur which can change the documentation meaning.

## 2021-12-29 ENCOUNTER — Inpatient Hospital Stay (HOSPITAL_COMMUNITY): Payer: Medicare Other

## 2021-12-29 ENCOUNTER — Encounter (HOSPITAL_COMMUNITY): Payer: Self-pay | Admitting: Internal Medicine

## 2021-12-29 DIAGNOSIS — M4646 Discitis, unspecified, lumbar region: Secondary | ICD-10-CM | POA: Diagnosis not present

## 2021-12-29 HISTORY — PX: IR LUMBAR DISC ASPIRATION W/IMG GUIDE: IMG5306

## 2021-12-29 LAB — BLOOD GAS, ARTERIAL
Acid-base deficit: 0.1 mmol/L (ref 0.0–2.0)
Bicarbonate: 26.4 mmol/L (ref 20.0–28.0)
O2 Saturation: 94.7 %
Patient temperature: 38.4
pCO2 arterial: 52 mmHg — ABNORMAL HIGH (ref 32–48)
pH, Arterial: 7.32 — ABNORMAL LOW (ref 7.35–7.45)
pO2, Arterial: 75 mmHg — ABNORMAL LOW (ref 83–108)

## 2021-12-29 LAB — CBC
HCT: 44.7 % (ref 39.0–52.0)
Hemoglobin: 14.2 g/dL (ref 13.0–17.0)
MCH: 30.9 pg (ref 26.0–34.0)
MCHC: 31.8 g/dL (ref 30.0–36.0)
MCV: 97.2 fL (ref 80.0–100.0)
Platelets: 268 10*3/uL (ref 150–400)
RBC: 4.6 MIL/uL (ref 4.22–5.81)
RDW: 18.9 % — ABNORMAL HIGH (ref 11.5–15.5)
WBC: 20.6 10*3/uL — ABNORMAL HIGH (ref 4.0–10.5)
nRBC: 0 % (ref 0.0–0.2)

## 2021-12-29 LAB — URINALYSIS, ROUTINE W REFLEX MICROSCOPIC
Bilirubin Urine: NEGATIVE
Glucose, UA: 150 mg/dL — AB
Hgb urine dipstick: NEGATIVE
Ketones, ur: NEGATIVE mg/dL
Leukocytes,Ua: NEGATIVE
Nitrite: NEGATIVE
Protein, ur: NEGATIVE mg/dL
Specific Gravity, Urine: 1.012 (ref 1.005–1.030)
pH: 5 (ref 5.0–8.0)

## 2021-12-29 LAB — BASIC METABOLIC PANEL
Anion gap: 12 (ref 5–15)
BUN: 67 mg/dL — ABNORMAL HIGH (ref 8–23)
CO2: 27 mmol/L (ref 22–32)
Calcium: 8.8 mg/dL — ABNORMAL LOW (ref 8.9–10.3)
Chloride: 103 mmol/L (ref 98–111)
Creatinine, Ser: 4.25 mg/dL — ABNORMAL HIGH (ref 0.61–1.24)
GFR, Estimated: 14 mL/min — ABNORMAL LOW (ref >60)
Glucose, Bld: 138 mg/dL — ABNORMAL HIGH (ref 70–99)
Potassium: 4.7 mmol/L (ref 3.5–5.1)
Sodium: 142 mmol/L (ref 135–145)

## 2021-12-29 MED ORDER — ORAL CARE MOUTH RINSE
15.0000 mL | Freq: Two times a day (BID) | OROMUCOSAL | Status: DC
  Administered 2021-12-29 – 2022-01-05 (×14): 15 mL via OROMUCOSAL

## 2021-12-29 MED ORDER — MUPIROCIN 2 % EX OINT
1.0000 "application " | TOPICAL_OINTMENT | Freq: Two times a day (BID) | CUTANEOUS | Status: AC
  Administered 2021-12-29 – 2022-01-02 (×9): 1 via NASAL
  Filled 2021-12-29 (×3): qty 22

## 2021-12-29 MED ORDER — CHLORHEXIDINE GLUCONATE CLOTH 2 % EX PADS
6.0000 | MEDICATED_PAD | Freq: Every day | CUTANEOUS | Status: AC
  Administered 2021-12-29 – 2022-01-02 (×5): 6 via TOPICAL

## 2021-12-29 MED ORDER — SODIUM CHLORIDE 0.9 % IV SOLN
2.0000 g | INTRAVENOUS | Status: DC
  Administered 2021-12-29 – 2021-12-30 (×2): 2 g via INTRAVENOUS
  Filled 2021-12-29 (×2): qty 20

## 2021-12-29 MED ORDER — SODIUM CHLORIDE 0.9 % IV SOLN
8.0000 mg/kg | INTRAVENOUS | Status: DC
  Administered 2021-12-29: 600 mg via INTRAVENOUS
  Filled 2021-12-29 (×2): qty 12

## 2021-12-29 MED ORDER — LIDOCAINE HCL (PF) 1 % IJ SOLN
INTRAMUSCULAR | Status: DC | PRN
  Administered 2021-12-29: 15 mL via INTRADERMAL

## 2021-12-29 MED ORDER — SODIUM CHLORIDE 0.9 % IV SOLN: INTRAVENOUS | Status: DC

## 2021-12-29 MED ORDER — LIDOCAINE HCL (PF) 1 % IJ SOLN
INTRAMUSCULAR | Status: AC
  Filled 2021-12-29: qty 30

## 2021-12-29 NOTE — Procedures (Signed)
Interventional Radiology Procedure Note  Procedure: L3-L4 disc aspiration  Complications: None  Estimated Blood Loss: None  Recommendations: - Successful, 1 mL purulent fluid aspirated, sent for culture  Signed,  Criselda Peaches, MD

## 2021-12-29 NOTE — Progress Notes (Signed)
   12/29/21 1825  Urine Characteristics  Urinary Interventions Bladder scan  Bladder Scan Volume (mL) 463 mL   Foley placed per MD verbal order. 325 cc emptied from initial foley drainage. MD made aware.  Layla Maw, RN

## 2021-12-29 NOTE — Plan of Care (Signed)
  Problem: Nutrition: Goal: Adequate nutrition will be maintained Outcome: Progressing   Problem: Safety: Goal: Ability to remain free from injury will improve 12/29/2021 0249 by Ashley Murrain, RN Outcome: Progressing 12/29/2021 0241 by Ashley Murrain, RN Outcome: Progressing   Problem: Skin Integrity: Goal: Risk for impaired skin integrity will decrease 12/29/2021 0249 by Ashley Murrain, RN Outcome: Progressing 12/29/2021 0241 by Ashley Murrain, RN Outcome: Progressing

## 2021-12-29 NOTE — Progress Notes (Signed)
PT placed on cpap for the night,tolerating well at this time.

## 2021-12-29 NOTE — TOC Initial Note (Signed)
Transition of Care [facility]) - Initial/Assessment Note    Patient Details  Name: [name] MRN: [id number] Date of Birth: 05/16/1946  [facility] CM/SW Contact:    Dessa Phi, RN Phone Number: 12/29/2021, 3:07 PM  Clinical Narrative: Has pcp,home,has own transport or can call for uber if needed. On 02-monitor.                  Expected Discharge Plan: Home/Self Care Barriers to Discharge: Continued Medical Work up   Patient Goals and CMS Choice Patient states their goals for this hospitalization and ongoing recovery are:: Home CMS Medicare.gov Compare Post Acute Care list provided to:: Patient Choice offered to / list presented to : Patient  Expected Discharge Plan and Services Expected Discharge Plan: Home/Self Care   Discharge Planning Services: CM Consult   Living arrangements for the past 2 months: Single Family Home                                      Prior Living Arrangements/Services Living arrangements for the past 2 months: Single Family Home Lives with:: Significant Other Patient language and need for interpreter reviewed:: Yes Do you feel safe going back to the place where you live?: Yes      Need for Family Participation in Patient Care: Yes (Comment) Care giver support system in place?: Yes (comment)   Criminal Activity/Legal Involvement Pertinent to Current Situation/Hospitalization: No - Comment as needed  Activities of Daily Living Home Assistive Devices/Equipment: Cane (specify quad or straight), Walker (specify type) ADL Screening (condition at time of admission) Patient's cognitive ability adequate to safely complete daily activities?: Yes Is the patient deaf or have difficulty hearing?: Yes Does the patient have difficulty seeing, even when wearing glasses/contacts?: No Does the patient have difficulty concentrating, remembering, or making decisions?: No Patient able to express need for assistance with ADLs?:  Yes Does the patient have difficulty dressing or bathing?: Yes Independently performs ADLs?: No Communication: Independent Dressing (OT): Needs assistance Is this a change from baseline?: Change from baseline, expected to last >3 days Grooming: Needs assistance Is this a change from baseline?: Change from baseline, expected to last >3 days Feeding: Independent Bathing: Needs assistance Is this a change from baseline?: Change from baseline, expected to last >3 days Toileting: Needs assistance Is this a change from baseline?: Change from baseline, expected to last >3days In/Out Bed: Needs assistance Is this a change from baseline?: Change from baseline, expected to last >3 days Walks in Home: Independent with device (comment) Does the patient have difficulty walking or climbing stairs?: Yes Weakness of Legs: Both Weakness of Arms/Hands: Both  Permission Sought/Granted Permission sought to share information with : Case Manager Permission granted to share information with : Yes, Verbal Permission Granted  Share Information with NAME: Case Manager           Emotional Assessment Appearance:: Appears stated age Attitude/Demeanor/Rapport: Gracious Affect (typically observed): Accepting Orientation: : Oriented to Self, Oriented to Place, Oriented to  Time, Oriented to Situation Alcohol / Substance Use: Not Applicable Psych Involvement: No (comment)  Admission diagnosis:  Discitis of lumbar region [M46.46] Chronic bilateral low back pain with bilateral sciatica [M54.42, M54.41, G89.29] Patient Active Problem List   Diagnosis Date Noted   Discitis of lumbar region 12/26/2021   Cervical radiculopathy 12/24/2021   Acute combined systolic and diastolic heart failure ([place]) 11/21/2021   CAD  S/P percutaneous coronary angioplasty 11/20/2021   S/P mitral valve clip implantation 11/20/2021   Severe mitral regurgitation    Acute on chronic heart failure ([place]) 08/22/2021   Opioid use  disorder 08/22/2021   Cellulitis 04/26/2021   Venous stasis dermatitis of both lower extremities 04/26/2021   PAD (peripheral artery disease) ([place]) 04/26/2021   Aortic atherosclerosis ([place]) 04/26/2021   HLD (hyperlipidemia) 11/27/2020   Prediabetes 10/13/2020   Non-ST elevation (NSTEMI) myocardial infarction [facility])    Hx of adenomatous colonic polyps    Benign neoplasm of cecum    Benign neoplasm of ascending colon    Benign neoplasm of transverse colon    Benign neoplasm of descending colon    Benign neoplasm of sigmoid colon    Long term current use of amiodarone 01/24/2018   GI bleed 02/15/2017   Pancytopenia, acquired ([place]) 11/26/2016   Tubular adenoma 11/25/2016   Gout 11/02/2016   Hepatitis C antibody test positive    Acute kidney injury ([place]) 11/01/2014   Leukopenia due to antineoplastic chemotherapy ([place]) 08/24/2014   Thrombocytopenia due to drugs 07/25/2014   Encounter for screening colonoscopy 07/02/2014   Chronic anticoagulation 07/02/2014   Acute pulmonary edema ([place]) 06/05/2014   Prolonged QT interval 06/05/2014   Sinus bradycardia 06/05/2014   Opioid dependence ([place]) 22/44/9753   Diastolic dysfunction 00/51/1021   COPD (chronic obstructive pulmonary disease) ([place]) 05/30/2014   Health care maintenance 05/24/2014   Acute respiratory failure with hypoxia and hypercapnia ([place]) 04/29/2014   Anemia due to antineoplastic chemotherapy 04/24/2014   Thyroid mass 08/26/2013   Myeloproliferative neoplasm ([place]) 08/15/2013   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   Tobacco abuse 09/15/2012   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[place])    Alcoholism ([place]) 09/30/2010   Hypertension 09/30/2010   Atrial fibrillation ([place])    PCP:  Marianna Payment, MD Pharmacy:   [facility] [place] [place] - [id number] [place] [address] Phone: [phone] Fax: [phone]     Social  Determinants of Health (SDOH) Interventions    Readmission Risk Interventions    08/24/2021   10:25 AM 05/17/2019    1:32 PM  Readmission Risk Prevention Plan  Transportation Screening Complete Complete  PCP or Specialist Appt within 3-5 Days Complete   HRI or [place] Complete   Social Work Consult for [place] Planning/Counseling Complete   Palliative Care Screening Not Applicable   Medication Review Press photographer) Complete Complete  PCP or Specialist appointment within 3-5 days of discharge  Patient refused  [place] or [place]  Patient refused  [place]  Not Applicable

## 2021-12-29 NOTE — Progress Notes (Signed)
Patient did not void. Bladder scan showed 474 ml. On call provider placed new order for in and out. Removed 825 ml of clear yellow/straw color. On coming nurse updated.

## 2021-12-29 NOTE — Progress Notes (Signed)
PROGRESS NOTE    [name]  ERX:[id number] DOB: [birth year] DOA: 12/26/2021 PCP: Marianna Payment, MD   Brief Narrative:  76 year old with history of COPD, CAD status post PCI to mid LAD 09/2020, A-fib status post cardioversion 09/2021, HTN, MDS, CKD stage IIIb, tobacco use, polycythemia vera, chronic opioid use on Suboxone, combined CHF EF 35%, severe MR status post TAVR 11/2021 admitted for worsening back pain for 1 month.  X-ray was negative, MRI showed concerns of discitis/osteomyelitis of L3-L4 region.  Neurosurgery was consulted who recommended IR consultation for drainage and culture if they can get any fluid.  After antibiotics until biopsy.   Assessment & Plan:  Principal Problem:   Discitis of lumbar region Active Problems:   Atrial fibrillation (HCC)   Alcoholism (HCC)   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HCC)   Myeloproliferative neoplasm (HCC)   COPD (chronic obstructive pulmonary disease) (HCC)   PAD (peripheral artery disease) (HCC)   Aortic atherosclerosis (HCC)   CAD S/P percutaneous coronary angioplasty   S/P mitral valve clip implantation   Lower back pain secondary to acute osteomyelitis/discitis of L3-L4 region -MRI shows evidence of osteomyelitis/discitis - IR plans on biopsy today.  Eliquis is currently on hold. - Antibiotics on hold until IR biopsy, thereafter start IV - Seen by neurosurgery.  No planned intervention - ID following.   AKI on CKD stage IIIb Acute urinary retention - Baseline creatinine around 2.2.  Cr today 4.25- will hold Aldactone, Farxiga and Torsemide.  Will give IV fluids. Check UA and Ucx  Congestive heart failure with reduced ejection fraction, 35% - Patient appears to be compensated right now.  Continue Toprol-XL. Hold Farxiga, Aldactone, torsemide- on hold due to AKI.   CAD status post PCI to mid LAD 09/2020 - Currently chest pain-free.  On Eliquis and statin.  Severe mitral regurgitation status  post TAVR/MitraClip Severe TR/aortic insufficiency - Patient underwent MitraClip placement 11/2021.  Currently on Eliquis (on hold).  Not on aspirin and Plavix - Tricuspid and aortic valve under surveillance by cardiology Spoke with Dr Stanford Breed from cardiology, notified ptn Is admitted- ok with holding AC if needed for the procedure. If his Cultures are positive, then he will need TEE. Appreciate his recs.   Persistent atrial fibrillation/flutter status post ablation -Heart rate appears to be controlled currently on Toprol  History of COPD - Bronchodilators.  I-S/flutter valve.  Obstructive sleep apnea - Likely undiagnosed outpatient.  Patient continues to be hypoxic with some pauses in breathing at night requiring supplemental oxygen.  CPAP nightly ordered   DVT prophylaxis: Eliquis Code Status: Full Code Family Communication:  Called [name]  Status is: Inpatient Remains inpatient appropriate because: IR biopsy today thereafter IV antibiotics.  Significant rising creatinine.  Maintain hospital stay   Subjective: Feels ok no complaints besides back pain.  Has urinary retention requiring straight cath.   Examination: Constitutional: Not in acute distress Respiratory: Clear to auscultation bilaterally Cardiovascular: Normal sinus rhythm, no rubs Abdomen: Nontender nondistended good bowel sounds Musculoskeletal: No edema noted Skin: No rashes seen Neurologic: CN 2-12 grossly intact.  And nonfocal Psychiatric: Normal judgment and insight. Alert and oriented x 3. Normal mood.   Objective: Vitals:   12/29/21 0627 12/29/21 0632 12/29/21 0633 12/29/21 0736  BP:      Pulse:      Resp:      Temp:      TempSrc:      SpO2: 94% (!) 87% 94% 95%  Weight:  Height:        Intake/Output Summary (Last 24 hours) at 12/29/2021 0744 Last data filed at 12/29/2021 0700 Gross per 24 hour  Intake 1758.14 ml  Output 1225 ml  Net 533.14 ml   Filed Weights   12/26/21 2114 12/27/21 0505  12/29/21 0500  Weight: 93.3 kg 93.3 kg 94.9 kg     Data Reviewed:   CBC: Recent Labs  Lab 12/26/21 1512 12/27/21 0055 12/28/21 0550 12/29/21 0446  WBC 13.4* 11.9* 13.7* 20.6*  NEUTROABS 10.6*  --   --   --   HGB 16.0  16.7 15.1 14.4 14.2  HCT 50.7  49.0 48.2 45.6 44.7  MCV 96.0 97.2 96.6 97.2  PLT 276 236 251 485   Basic Metabolic Panel: Recent Labs  Lab 12/26/21 1449 12/26/21 1512 12/27/21 0055 12/28/21 0550 12/29/21 0446  NA 139 139 140 139 142  K 4.3 4.2 4.4 4.1 4.7  CL 98 98 100 101 103  CO2 30  --  '30 25 27  '$ GLUCOSE 126* 127* 116* 127* 138*  BUN 41* 40* 44* 53* 67*  CREATININE 2.14* 2.40* 2.12* 2.80* 4.25*  CALCIUM 8.7*  --  8.3* 8.1* 8.8*  MG  --   --   --  2.4  --    GFR: Estimated Creatinine Clearance: 16.4 mL/min (A) (by C-G formula based on SCr of 4.25 mg/dL (H)). Liver Function Tests: Recent Labs  Lab 12/27/21 0055  AST 20  ALT 26  ALKPHOS 116  BILITOT 0.9  PROT 7.0  ALBUMIN 3.0*   No results for input(s): "LIPASE", "AMYLASE" in the last 168 hours. No results for input(s): "AMMONIA" in the last 168 hours. Coagulation Profile: Recent Labs  Lab 12/27/21 0055  INR 1.1   Cardiac Enzymes: No results for input(s): "CKTOTAL", "CKMB", "CKMBINDEX", "TROPONINI" in the last 168 hours. BNP (last 3 results) No results for input(s): "PROBNP" in the last 8760 hours. HbA1C: No results for input(s): "HGBA1C" in the last 72 hours. CBG: No results for input(s): "GLUCAP" in the last 168 hours. Lipid Profile: No results for input(s): "CHOL", "HDL", "LDLCALC", "TRIG", "CHOLHDL", "LDLDIRECT" in the last 72 hours. Thyroid Function Tests: No results for input(s): "TSH", "T4TOTAL", "FREET4", "T3FREE", "THYROIDAB" in the last 72 hours. Anemia Panel: No results for input(s): "VITAMINB12", "FOLATE", "FERRITIN", "TIBC", "IRON", "RETICCTPCT" in the last 72 hours. Sepsis Labs: Recent Labs  Lab 12/26/21 2207 12/27/21 0055  PROCALCITON <0.10  --    LATICACIDVEN 1.5 1.6    Recent Results (from the past 240 hour(s))  MRSA Next Gen by PCR, Nasal     Status: Abnormal   Collection Time: 12/26/21 10:26 PM   Specimen: Nasal Mucosa; Nasal Swab  Result Value Ref Range Status   MRSA by PCR Next Gen DETECTED (A) NOT DETECTED Final    Comment: RESULT CALLED TO, READ BACK BY AND VERIFIED WITH: Lear Ng, RN 12/27/2021 @ 63 [facility] (NOTE) The GeneXpert MRSA Assay (FDA approved for NASAL specimens only), is one component of a comprehensive MRSA colonization surveillance program. It is not intended to diagnose MRSA infection nor to guide or monitor treatment for MRSA infections. Test performance is not FDA approved in patients less than 72 years old. Performed at [facility], [place] [address], [place], [place] [address]   Culture, blood (Routine X 2) w Reflex to ID Panel     Status: None (Preliminary result)   Collection Time: 12/27/21 12:55 AM   Specimen: BLOOD  Result Value Ref Range  Status   Specimen Description   Final    BLOOD BLOOD RIGHT HAND Performed at [facility] [address], [place], [place] [address]    Special Requests   Final    BOTTLES DRAWN AEROBIC ONLY Blood Culture results may not be optimal due to an inadequate volume of blood received in culture bottles Performed at [facility] [address] [place], [place] [address]    Culture   Final    NO GROWTH 1 DAY Performed at [facility], [place] [address], [place], [place] [address]    Report Status PENDING  Incomplete  Culture, blood (Routine X 2) w Reflex to ID Panel     Status: None (Preliminary result)   Collection Time: 12/27/21 12:55 AM   Specimen: BLOOD  Result Value Ref Range Status   Specimen Description   Final    BLOOD RIGHT ANTECUBITAL Performed at [facility] [address] [place], [place] [address]    Special Requests   Final    BOTTLES DRAWN AEROBIC ONLY Blood  Culture adequate volume Performed at [facility] [address], [place], [place] [address]    Culture   Final    NO GROWTH 1 DAY Performed at [facility], [place] [address] [place], [place] [address]    Report Status PENDING  Incomplete         Radiology Studies: No results found.      Scheduled Meds:  amiodarone  200 mg Oral Daily   atorvastatin  80 mg Oral Daily   buprenorphine-naloxone  1 tablet Sublingual Daily   Chlorhexidine Gluconate Cloth  6 each Topical Q0600   docusate sodium  100 mg Oral BID   fluticasone furoate-vilanterol  1 puff Inhalation Daily   gabapentin  300 mg Oral TID   mouth rinse  15 mL Mouth Rinse BID   metoprolol succinate  12.5 mg Oral Daily   mupirocin ointment  1 application  Nasal BID   potassium chloride SA  20 mEq Oral Daily   sodium chloride flush  3 mL Intravenous Q12H   Continuous Infusions:  sodium chloride       LOS: 3 days   Time spent= 35 mins    Erik Nessel Zhane Loader, MD [facility]  If 7PM-7AM, please contact night-coverage  12/29/2021, 7:44 AM

## 2021-12-29 NOTE — Plan of Care (Signed)
  Problem: Nutrition: Goal: Adequate nutrition will be maintained Outcome: Progressing   Problem: Safety: Goal: Ability to remain free from injury will improve Outcome: Progressing   Problem: Skin Integrity: Goal: Risk for impaired skin integrity will decrease Outcome: Progressing   

## 2021-12-29 NOTE — Care Management Important Message (Signed)
Important Message  Patient Details IM Letter placed in Patients room. Name: [name] MRN: [id number] Date of Birth: [birth year]   Medicare Important Message Given:  Yes     Kerin Salen 12/29/2021, 12:14 PM

## 2021-12-30 ENCOUNTER — Inpatient Hospital Stay (HOSPITAL_COMMUNITY): Payer: Medicare Other

## 2021-12-30 DIAGNOSIS — N179 Acute kidney failure, unspecified: Secondary | ICD-10-CM | POA: Diagnosis not present

## 2021-12-30 DIAGNOSIS — M4646 Discitis, unspecified, lumbar region: Secondary | ICD-10-CM | POA: Diagnosis not present

## 2021-12-30 LAB — CBC
HCT: 42.8 % (ref 39.0–52.0)
Hemoglobin: 13.3 g/dL (ref 13.0–17.0)
MCH: 30.8 pg (ref 26.0–34.0)
MCHC: 31.1 g/dL (ref 30.0–36.0)
MCV: 99.1 fL (ref 80.0–100.0)
Platelets: 250 10*3/uL (ref 150–400)
RBC: 4.32 MIL/uL (ref 4.22–5.81)
RDW: 18.8 % — ABNORMAL HIGH (ref 11.5–15.5)
WBC: 17.1 10*3/uL — ABNORMAL HIGH (ref 4.0–10.5)
nRBC: 0 % (ref 0.0–0.2)

## 2021-12-30 LAB — BASIC METABOLIC PANEL
Anion gap: 11 (ref 5–15)
BUN: 77 mg/dL — ABNORMAL HIGH (ref 8–23)
CO2: 26 mmol/L (ref 22–32)
Calcium: 8.5 mg/dL — ABNORMAL LOW (ref 8.9–10.3)
Chloride: 103 mmol/L (ref 98–111)
Creatinine, Ser: 3.87 mg/dL — ABNORMAL HIGH (ref 0.61–1.24)
GFR, Estimated: 16 mL/min — ABNORMAL LOW (ref >60)
Glucose, Bld: 138 mg/dL — ABNORMAL HIGH (ref 70–99)
Potassium: 5.9 mmol/L — ABNORMAL HIGH (ref 3.5–5.1)
Sodium: 140 mmol/L (ref 135–145)

## 2021-12-30 LAB — URINE CULTURE: Culture: NO GROWTH

## 2021-12-30 LAB — CK: Total CK: 509 U/L — ABNORMAL HIGH (ref 49–397)

## 2021-12-30 LAB — BRAIN NATRIURETIC PEPTIDE: B Natriuretic Peptide: 396.6 pg/mL — ABNORMAL HIGH (ref 0.0–100.0)

## 2021-12-30 MED ORDER — APIXABAN 5 MG PO TABS
5.0000 mg | ORAL_TABLET | Freq: Two times a day (BID) | ORAL | Status: DC
  Administered 2021-12-30 – 2022-01-05 (×12): 5 mg via ORAL
  Filled 2021-12-30 (×12): qty 1

## 2021-12-30 MED ORDER — LORAZEPAM 2 MG/ML IJ SOLN
1.0000 mg | Freq: Once | INTRAMUSCULAR | Status: AC
Start: 2021-12-30 — End: 2021-12-30
  Administered 2021-12-30: 1 mg via INTRAVENOUS
  Filled 2021-12-30: qty 1

## 2021-12-30 MED ORDER — SODIUM CHLORIDE 0.9 % IV SOLN: INTRAVENOUS | Status: AC

## 2021-12-30 MED ORDER — SODIUM ZIRCONIUM CYCLOSILICATE 10 G PO PACK
10.0000 g | PACK | Freq: Two times a day (BID) | ORAL | Status: AC
Start: 2021-12-30 — End: 2021-12-30
  Administered 2021-12-30 (×2): 10 g via ORAL
  Filled 2021-12-30 (×2): qty 1

## 2021-12-30 MED ORDER — ACETAMINOPHEN 325 MG PO TABS
650.0000 mg | ORAL_TABLET | Freq: Once | ORAL | Status: AC
  Administered 2021-12-30: 650 mg via ORAL
  Filled 2021-12-30: qty 2

## 2021-12-30 NOTE — Progress Notes (Signed)
   12/30/21 1352  Assess: MEWS Score  Temp (!) 102.2 F (39 C)  BP 106/82  Pulse Rate (!) 106  Resp 20  Level of Consciousness Alert  SpO2 94 %  O2 Device Nasal Cannula  O2 Flow Rate (L/min) 3 L/min  Assess: MEWS Score  MEWS Temp 2  MEWS Systolic 0  MEWS Pulse 1  MEWS RR 0  MEWS LOC 0  MEWS Score 3  MEWS Score Color Yellow  Assess: if the MEWS score is Yellow or Red  Were vital signs taken at a resting state? Yes  Focused Assessment No change from prior assessment  Does the patient meet 2 or more of the SIRS criteria? Yes  Does the patient have a confirmed or suspected source of infection? Yes  Provider and Rapid Response Notified? Yes (MD aware)  MEWS guidelines implemented *See Row Information* No, previously yellow, continue vital signs every 4 hours  Treat  Pain Scale 0-10  Pain Score 0  Escalate  MEWS: Escalate Yellow: discuss with charge nurse/RN and consider discussing with provider and RRT  Notify: Charge Nurse/RN  Name of Charge Nurse/RN Notified Maricela Bo RN  Date Charge Nurse/RN Notified 12/30/21  Time Charge Nurse/RN Notified 1357  Notify: Provider  Provider Name/Title Dr. Reesa Chew  Date Provider Notified 12/30/21  Time Provider Notified 1357  Method of Notification Page (secure chat)  Assess: SIRS CRITERIA  SIRS Temperature  1  SIRS Pulse 1  SIRS Respirations  0  SIRS WBC 1  SIRS Score Sum  3

## 2021-12-30 NOTE — Progress Notes (Signed)
Followed up with Primary RN and checked on patient status from RRT call earlier.  Patient was resting comfortably with eyes closed.

## 2021-12-30 NOTE — Progress Notes (Signed)
Followed up with Primary RN about patient's status.  Chest XR appears negative by the picture at this time.  On-call MD adding on some mornings labs.

## 2021-12-30 NOTE — Progress Notes (Signed)
   12/30/21 1412  Provider Notification  Provider response See new orders  Date of Provider Response 12/30/21  Time of Provider Response 1412  Rapid Response Notification  Date Rapid Response Notified 12/30/21  Time Rapid Response Notified 1413

## 2021-12-30 NOTE — Significant Event (Signed)
Rapid Response Event Note   Reason for Call :  1405 d/t possible Aspiration  Initial Focused Assessment:  Patient was A&Ox4, tremors and spoke clearly.  Patient did have expiratory wheezing.  Other neurovascular assessment charted in Epic were WNL.   Interventions:  Patient already increased from 2L to 4L [place] by Primary RN.  RT called to evaluate patient as well.  On-call MD paged and STAT Chest XR to be preformed.    Plan of Care:  Will continue to monitor and determine action plan from results of Chest XR.  Will give potential antiemetic, neb Tx and potential oral/nasal suction to assist.   Event Summary:  VS - BP 103/64  HR - 104 (98)  RR - 22 (counted) 18 now T-99.2  SpO2 - 93% 4L [place] Patient was able to answer all questions and had potentially aspirated while sipping some water with emesis present.  MD Notified:  [name] NP Call Time: Guy Time: 9163 End Time: 0345  Ethelene Hal, RN

## 2021-12-30 NOTE — Progress Notes (Signed)
Pt was scarfing down a [place] sandwich, I had just returned from grabbing the pt a sprite to drink. I fixed his sprite for him and handed it over. Pt has tremors, so I continued to ask if he was okay while eating his food. Pt was alert and able to respond clearly . Pt began scarfing down the last bit of sandwich and slowed down and took a huge sip of sprite. He then turned more red than usual and started spitting up and trying to talk. He was able to cough up a scant amount of food/drink but continued to verbalize that "something went down the wrong pipe." RN sat pt elevated pt and encouraged him to cough. Cough was weak and non-productive. RN assessed lung sounds and now heard increased expiratory wheezing and mild gurgling in the throat prior to listening with stethoscope. Pt remained Alert and responsive the entire time. RN assessed pt, pulses palpable 2+ on BUE and BLE. VS : BP 103/64, spO2 93%, HR 104, RR 24, Temp 99.2. Rn suctioned pt, checked vitals and notified RRT. Team arrived, RRT RN performed thorough assessment along with RT, they auscultated lung sounds and patient received nebulizer trx. Provider had been notified and STAT chest-xray was ordered.  '@04'$ :11, pt is now back on 4 L via [place], still alert and responsive, but keeps wanting to go back to sleep . HR 110, Respirations 15   RRT Nurse returned to floor to follow up with event and pt stable, asleep and when asked he verbalizes he is doing fine and does not appear to be in distress except for his tremors.   Ronna Polio, RN

## 2021-12-30 NOTE — Progress Notes (Signed)
   12/30/21 1133  Assess: MEWS Score  Temp (!) 101.9 F (38.8 C)  BP (!) 111/96  MAP (mmHg) 102  Pulse Rate (!) 108  Resp 20  Level of Consciousness Alert  SpO2 93 %  O2 Device Nasal Cannula  O2 Flow Rate (L/min) 3 L/min  Assess: MEWS Score  MEWS Temp 2  MEWS Systolic 0  MEWS Pulse 1  MEWS RR 0  MEWS LOC 0  MEWS Score 3  MEWS Score Color Yellow  Assess: if the MEWS score is Yellow or Red  Were vital signs taken at a resting state? Yes  Focused Assessment No change from prior assessment  Does the patient meet 2 or more of the SIRS criteria? Yes  Does the patient have a confirmed or suspected source of infection? Yes  Provider and Rapid Response Notified? Yes  MEWS guidelines implemented *See Row Information* No, previously yellow, continue vital signs every 4 hours  Escalate  MEWS: Escalate Yellow: discuss with charge nurse/RN and consider discussing with provider and RRT  Notify: Charge Nurse/RN  Name of Charge Nurse/RN Notified Maricela Bo RN  Date Charge Nurse/RN Notified 12/30/21  Assess: SIRS CRITERIA  SIRS Temperature  1  SIRS Pulse 1  SIRS Respirations  0  SIRS WBC 1  SIRS Score Sum  3

## 2021-12-30 NOTE — Progress Notes (Signed)
[facility]   Reason for visit: Follow up on discitis  Interval History: MRI with L3-4 discitis/osteomyelitis with marrow edema and fluid in the disc space.  Aspiration with ngtd.    Day 2 daptomcyin + ceftriaxone  Physical Exam: Constitutional:  Vitals:   12/30/21 1133 12/30/21 1352  BP: (!) 111/96 106/82  Pulse: (!) 108 (!) 106  Resp: 20 20  Temp: (!) 101.9 F (38.8 C) (!) 102.2 F (39 C)  SpO2: 93% 94%   patient is alert, but falls asleep Respiratory: Normal respiratory effort; CTA B Neuro: right arm twitching   Review of Systems: Gastrointestinal: negative for diarrhea  Lab Results  Component Value Date   WBC 17.1 (H) 12/30/2021   HGB 13.3 12/30/2021   HCT 42.8 12/30/2021   MCV 99.1 12/30/2021   PLT 250 12/30/2021    Lab Results  Component Value Date   CREATININE 3.87 (H) 12/30/2021   BUN 77 (H) 12/30/2021   NA 140 12/30/2021   K 5.9 (H) 12/30/2021   CL 103 12/30/2021   CO2 26 12/30/2021    Lab Results  Component Value Date   ALT 26 12/27/2021   AST 20 12/27/2021   ALKPHOS 116 12/27/2021     Microbiology: Recent Results (from the past 240 hour(s))  MRSA Next Gen by PCR, Nasal     Status: Abnormal   Collection Time: 12/26/21 10:26 PM   Specimen: Nasal Mucosa; Nasal Swab  Result Value Ref Range Status   MRSA by PCR Next Gen DETECTED (A) NOT DETECTED Final    Comment: RESULT CALLED TO, READ BACK BY AND VERIFIED WITH: Lear Ng, RN 12/27/2021 @ 35 [facility] (NOTE) The GeneXpert MRSA Assay (FDA approved for NASAL specimens only), is one component of a comprehensive MRSA colonization surveillance program. It is not intended to diagnose MRSA infection nor to guide or monitor treatment for MRSA infections. Test performance is not FDA approved in patients less than 37 years old. Performed at [facility], [place] [address] [place], [place] [address]   Culture, blood (Routine X 2) w Reflex to ID Panel      Status: None (Preliminary result)   Collection Time: 12/27/21 12:55 AM   Specimen: BLOOD  Result Value Ref Range Status   Specimen Description   Final    BLOOD BLOOD RIGHT HAND Performed at [facility] [address], [place], [place] [address]    Special Requests   Final    BOTTLES DRAWN AEROBIC ONLY Blood Culture results may not be optimal due to an inadequate volume of blood received in culture bottles Performed at [facility] [address], [place], [place] [address]    Culture   Final    NO GROWTH 3 DAYS Performed at [facility], [place] [address] [place], [place] [address]    Report Status PENDING  Incomplete  Culture, blood (Routine X 2) w Reflex to ID Panel     Status: None (Preliminary result)   Collection Time: 12/27/21 12:55 AM   Specimen: BLOOD  Result Value Ref Range Status   Specimen Description   Final    BLOOD RIGHT ANTECUBITAL Performed at [facility] [address], [place], [place] [address]    Special Requests   Final    BOTTLES DRAWN AEROBIC ONLY Blood Culture adequate volume Performed at [facility] [address], [place], [place] [address]    Culture  Final    NO GROWTH 3 DAYS Performed at [facility], [place] [address] [place], [place] [address]    Report Status PENDING  Incomplete  Culture, blood (Routine X 2) w Reflex to ID Panel     Status: None (Preliminary result)   Collection Time: 12/29/21  8:04 AM   Specimen: BLOOD  Result Value Ref Range Status   Specimen Description   Final    BLOOD RIGHT ANTECUBITAL Performed at [facility] [address], [place], [place] [address]    Special Requests   Final    IN PEDIATRIC BOTTLE Blood Culture results may not be optimal due to an inadequate volume of blood received in culture bottles Performed at [facility] [address] [place], [place] [address]    Culture    Final    NO GROWTH 1 DAY Performed at [facility], [place] [address], [place], [place] [address]    Report Status PENDING  Incomplete  Culture, blood (Routine X 2) w Reflex to ID Panel     Status: None (Preliminary result)   Collection Time: 12/29/21  8:10 AM   Specimen: BLOOD  Result Value Ref Range Status   Specimen Description   Final    BLOOD BLOOD LEFT HAND Performed at [facility] [address], [place], [place] [address]    Special Requests   Final    BOTTLES DRAWN AEROBIC ONLY Blood Culture results may not be optimal due to an inadequate volume of blood received in culture bottles Performed at [facility] [address] [place], [place] [address]    Culture   Final    NO GROWTH 1 DAY Performed at [facility], [place] [address], [place], [place] [address]    Report Status PENDING  Incomplete  Urine Culture     Status: None   Collection Time: 12/29/21  8:44 AM   Specimen: Urine, Clean Catch  Result Value Ref Range Status   Specimen Description   Final    URINE, CLEAN CATCH Performed at [facility], [place] [address], [place], [place] [address]    Special Requests   Final    NONE Performed at [facility], [place] [address] [place], [place] [address]    Culture   Final    NO GROWTH Performed at [facility], [place] [address], [place], [place] [address]    Report Status 12/30/2021 FINAL  Final  Aerobic/Anaerobic Culture w Gram Stain (surgical/deep wound)     Status: None (Preliminary result)   Collection Time: 12/29/21  4:34 PM   Specimen: Wound; Abscess  Result Value Ref Range Status   Specimen Description   Final    WOUND Performed at [facility] [address] [place], [place] [address]    Special Requests   Final    ABSCESS Performed at [facility], [place] [address], [place], [place] [address]    Gram Stain   Final    MODERATE  WBC PRESENT, PREDOMINANTLY PMN NO ORGANISMS SEEN    Culture   Final    NO GROWTH < 12 HOURS Performed at [facility] [address] [place],  [address]    Report Status PENDING  Incomplete    Impression/Plan:  1. Lumbar discitis/osteomyelitis - now s/p aspiration and multiple WBCs noted.  No growth to date.  Antibiotics started empirically after aspiration with  daptomycin and ceftriaxone and will continue with those.   Will continue to monitor the cultures.    2.  Acute renal insufficiency - elevated on admission and avoiding vancomycin due to nephrotoxicity potential.  Will continue to monitor.  3.  Medication monitoring - CK 509 at baseline, will recheck tomorrow.

## 2021-12-30 NOTE — Progress Notes (Signed)
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  X-ray was negative, MRI showed concerns of discitis/osteomyelitis of L3-L4 region.  Neurosurgery was consulted who recommended IR consultation for drainage and culture if they can get any fluid.  After antibiotics until biopsy.  Patient finally underwent biopsy on 6/12, antibiotics started.  Hospital course has also been complicated by hypoxia and acute kidney injury secondary to urinary retention.  Foley catheter has been placed.   Assessment & Plan:  Principal Problem:   Discitis of lumbar region Active Problems:   Atrial fibrillation (HCC)   Alcoholism (HCC)   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HCC)   Myeloproliferative neoplasm (HCC)   COPD (chronic obstructive pulmonary disease) (HCC)   PAD (peripheral artery disease) (HCC)   Aortic atherosclerosis (HCC)   CAD S/P percutaneous coronary angioplasty   S/P mitral valve clip implantation   Lower back pain secondary to acute osteomyelitis/discitis of L3-L4 region -MRI shows evidence of osteomyelitis/discitis - Status post IR biopsy yesterday.  Cultures are still negative.  We will plan on resuming Lasix later today - IV Rocephin and daptomycin. ID following.   AKI on CKD stage IIIb Acute urinary retention - Baseline creatinine around 2.2.  Creatinine peaked at 4.25, improved at 3.87.  Due to acute retention, Foley catheter had to be placed.  UA is negative.  Now on antibiotics for lumbar osteo.  Monitor urine output.  Hyperkalemia - Potassium 5.9.  Lokelma x 2 doses.  EKG  ordered  Acute hypoxemia - Currently on 2-3 L nasal cannula.  There is concerns of undiagnosed sleep apnea, CPAP ordered.  Chest x-ray this morning is clear.  We will check BNP.  Getting gentle hydration due to very poor oral intake but low threshold to discontinue this.  Home diuretics are on hold due to AKI  Twitching -No evidence of seizure.  Upper extremity and shoulder twitching.  Perhaps physiological stress, electrolyte imbalance?.  We will continue to monitor this.  Otherwise ongoing treatment as mentioned above.  Hishome medications reviewed. Trial of one dose of ativan.   Congestive heart failure with reduced ejection fraction, 35% - Patient appears to be compensated right now.  Continue Toprol-XL. Hold Farxiga, Aldactone, torsemide- on hold due to AKI.   CAD status post PCI to mid LAD 09/2020 - Currently chest pain-free.  On Eliquis and statin.  Severe mitral regurgitation status post TAVR/MitraClip Severe TR/aortic insufficiency - Patient underwent MitraClip placement 11/2021.  We will plan to restart Eliquis today - Tricuspid and aortic valve under surveillance by cardiology Spoke with Dr Stanford Breed from cardiology (over the weekend), notified ptn Is admitted- ok with holding [facility] if needed for the procedure. If his Cultures are positive, then he will need TEE. Appreciate his recs.   Persistent atrial fibrillation/flutter status post ablation -Heart rate appears to be controlled currently on Toprol  History of COPD - Bronchodilators.  I-S/flutter valve.  Obstructive sleep apnea - Likely undiagnosed outpatient.  Patient continues to be hypoxic with some pauses in breathing at night requiring supplemental oxygen.  CPAP nightly ordered   DVT prophylaxis: Eliquis, pharmacy advised to restarted  24 hours after the procedure Code Status: Full Code Family Communication:    Status is: Inpatient Remains inpatient appropriate because: Maintain hospital stay for IV antibiotics,  follow-up culture data.  In the meantime addressing his AKI, and hypoxia as well.   Subjective: When I walked in the room patient was agreeable to twitching of her upper extremity and shoulders but having a normal conversation with me.  He did report of a bit of back discomfort. Overnight he had some evidence of hypoxia requiring supplemental oxygen.  Chest x-ray was negative.  There is also question of aspiration as patient was found to be eating his food very quickly followed by coughing.  Examination: Constitutional: Not in acute distress, chronically ill Respiratory: Some bilateral rhonchi Cardiovascular: Sinus tachycardia. Abdomen: Nontender nondistended good bowel sounds Musculoskeletal: No edema noted Skin: No rashes seen Neurologic: CN 2-12 grossly intact.  And nonfocal. Mild twtiching but no seizure.  Psychiatric: Normal judgment and insight. Alert and oriented x 3. Normal mood.  Objective: Vitals:   12/30/21 0340 12/30/21 0400 12/30/21 0511 12/30/21 0735  BP: 103/64  (!) 140/115   Pulse: (!) 104  80   Resp:  18 20   Temp: 99.2 F (37.3 C)  98.6 F (37 C)   TempSrc: Oral  Oral   SpO2: 93%  93% 93%  Weight:      Height:        Intake/Output Summary (Last 24 hours) at 12/30/2021 0736 Last data filed at 12/30/2021 0500 Gross per 24 hour  Intake 1927.07 ml  Output 825 ml  Net 1102.07 ml   Filed Weights   12/26/21 2114 12/27/21 0505 12/29/21 0500  Weight: 93.3 kg 93.3 kg 94.9 kg     Data Reviewed:   CBC: Recent Labs  Lab 12/26/21 1512 12/27/21 0055 12/28/21 0550 12/29/21 0446 12/30/21 0439  WBC 13.4* 11.9* 13.7* 20.6* 17.1*  NEUTROABS 10.6*  --   --   --   --   HGB 16.0  16.7 15.1 14.4 14.2 13.3  HCT 50.7  49.0 48.2 45.6 44.7 42.8  MCV 96.0 97.2 96.6 97.2 99.1  PLT 276 236 251 268 144   Basic Metabolic Panel: Recent Labs  Lab 12/26/21 1449 12/26/21 1512 12/27/21 0055 12/28/21 0550 12/29/21 0446 12/30/21 0439  NA 139 139 140 139 142 140  K  4.3 4.2 4.4 4.1 4.7 5.9*  CL 98 98 100 101 103 103  CO2 30  --  '30 25 27 26  '$ GLUCOSE 126* 127* 116* 127* 138* 138*  BUN 41* 40* 44* 53* 67* 77*  CREATININE 2.14* 2.40* 2.12* 2.80* 4.25* 3.87*  CALCIUM 8.7*  --  8.3* 8.1* 8.8* 8.5*  MG  --   --   --  2.4  --   --    GFR: Estimated Creatinine Clearance: 18 mL/min (A) (by C-G formula based on SCr of 3.87 mg/dL (H)). Liver Function Tests: Recent Labs  Lab 12/27/21 0055  AST 20  ALT 26  ALKPHOS 116  BILITOT 0.9  PROT 7.0  ALBUMIN 3.0*   No results for input(s): "LIPASE", "AMYLASE" in the last 168 hours. No results for input(s): "AMMONIA" in the last 168 hours. Coagulation Profile: Recent Labs  Lab 12/27/21 0055  INR 1.1   Cardiac Enzymes: Recent Labs  Lab 12/30/21 0439  CKTOTAL 509*   BNP (last 3 results) No results for input(s): "PROBNP" in the last 8760 hours. HbA1C: No results for input(s): "HGBA1C" in the last 72 hours. CBG: No  results for input(s): "GLUCAP" in the last 168 hours. Lipid Profile: No results for input(s): "CHOL", "HDL", "LDLCALC", "TRIG", "CHOLHDL", "LDLDIRECT" in the last 72 hours. Thyroid Function Tests: No results for input(s): "TSH", "T4TOTAL", "FREET4", "T3FREE", "THYROIDAB" in the last 72 hours. Anemia Panel: No results for input(s): "VITAMINB12", "FOLATE", "FERRITIN", "TIBC", "IRON", "RETICCTPCT" in the last 72 hours. Sepsis Labs: Recent Labs  Lab 12/26/21 2207 12/27/21 0055  PROCALCITON <0.10  --   LATICACIDVEN 1.5 1.6    Recent Results (from the past 240 hour(s))  MRSA Next Gen by PCR, Nasal     Status: Abnormal   Collection Time: 12/26/21 10:26 PM   Specimen: Nasal Mucosa; Nasal Swab  Result Value Ref Range Status   MRSA by PCR Next Gen DETECTED (A) NOT DETECTED Final    Comment: RESULT CALLED TO, READ BACK BY AND VERIFIED WITH: Lear Ng, RN 12/27/2021 @ 31 [facility] (NOTE) The GeneXpert MRSA Assay (FDA approved for NASAL specimens only), is one component of a comprehensive MRSA  colonization surveillance program. It is not intended to diagnose MRSA infection nor to guide or monitor treatment for MRSA infections. Test performance is not FDA approved in patients less than 50 years old. Performed at [facility], [place] [address], [place], [place] [address]   Culture, blood (Routine X 2) w Reflex to ID Panel     Status: None (Preliminary result)   Collection Time: 12/27/21 12:55 AM   Specimen: BLOOD  Result Value Ref Range Status   Specimen Description   Final    BLOOD BLOOD RIGHT HAND Performed at [facility] [address], [place], [place] [address]    Special Requests   Final    BOTTLES DRAWN AEROBIC ONLY Blood Culture results may not be optimal due to an inadequate volume of blood received in culture bottles Performed at [facility] [address] [place], [place] [address]    Culture   Final    NO GROWTH 2 DAYS Performed at [facility] [address] [place], [place] [address]    Report Status PENDING  Incomplete  Culture, blood (Routine X 2) w Reflex to ID Panel     Status: None (Preliminary result)   Collection Time: 12/27/21 12:55 AM   Specimen: BLOOD  Result Value Ref Range Status   Specimen Description   Final    BLOOD RIGHT ANTECUBITAL Performed at [facility] [address], [place], [place] [address]    Special Requests   Final    BOTTLES DRAWN AEROBIC ONLY Blood Culture adequate volume Performed at [facility] [address], [place], [place] [address]    Culture   Final    NO GROWTH 2 DAYS Performed at [facility] [address], [place], [place] [address]    Report Status PENDING  Incomplete  Aerobic/Anaerobic Culture w Gram Stain (surgical/deep wound)     Status: None (Preliminary result)   Collection Time: 12/29/21  4:34 PM   Specimen: Wound; Abscess  Result Value Ref Range Status   Specimen Description   Final     WOUND Performed at [facility] [address] [place],  [address]    Special Requests   Final    ABSCESS Performed at [facility], [place] [address] [place], [place] [address]    Gram Stain   Final    MODERATE WBC PRESENT, PREDOMINANTLY PMN NO ORGANISMS SEEN  Culture   Final    NO GROWTH < 12 HOURS Performed at [facility], [place] [address], [place], [place] [address]    Report Status PENDING  Incomplete         Radiology Studies: DG CHEST PORT 1 VIEW  Result Date: 12/30/2021 CLINICAL DATA:  76 year old male with respiratory compromise. Atrial fibrillation. EXAM: PORTABLE CHEST 1 VIEW COMPARISON:  Chest radiographs 11/18/2021 and earlier. FINDINGS: Portable AP semi upright view at 0348 hours. Cardiac and mediastinal contours are stable and within normal limits. Left chest loop recorder or lead less ICD is unchanged. Visualized tracheal air column is within normal limits. Stable lung markings. Allowing for portable technique the lungs are clear. No pneumothorax or pleural effusion. Bulky degeneration at the right shoulder glenohumeral joint. No acute osseous abnormality identified. Negative visible bowel gas. IMPRESSION: No acute cardiopulmonary abnormality. Electronically Signed   By: Genevie Ann M.D.   On: 12/30/2021 04:25   IR LUMBAR DISC ASPIRATION W/IMG GUIDE  Result Date: 12/29/2021 INDICATION: 76 year old male with L3-L4 osteomyelitis discitis. He presents for disc aspiration. EXAM: Disc aspiration, lumbar MEDICATIONS: The patient is currently admitted to the hospital and receiving intravenous antibiotics. The antibiotics were administered within an appropriate time frame prior to the initiation of the procedure. ANESTHESIA/SEDATION: None. COMPLICATIONS: None immediate. PROCEDURE: Informed written consent was obtained from the patient after a thorough discussion of the procedural risks, benefits and alternatives. All questions were  addressed. Maximal Sterile Barrier Technique was utilized including caps, mask, sterile gowns, sterile gloves, sterile drape, hand hygiene and skin antiseptic. A timeout was performed prior to the initiation of the procedure. The L3-L4 disc space was identified fluoroscopically. An RPO angulation was selected. The skin was marked and prepped in standard sterile fashion using Betadine skin prep. Local anesthesia was attained by infiltration with 1% lidocaine. Under real-time fluoroscopy, a curved 22 gauge 15 cm Chiba needle was carefully advanced through the soft tissues and used to enter into the disc space. Aspiration was performed yielding approximately 0.5 mL of purulent fluid. This was sent for Gram stain and culture. IMPRESSION: Successful L3-L4 disc aspiration yielding 0.5 mL purulent fluid. Electronically Signed   By: Jacqulynn Cadet M.D.   On: 12/29/2021 16:54   US RENAL  Result Date: 12/29/2021 CLINICAL DATA:  Acute kidney injury EXAM: RENAL / URINARY TRACT ULTRASOUND COMPLETE COMPARISON:  None Available. FINDINGS: Right Kidney: Renal measurements: 8.5 x 5.2 x 5.2 cm = volume: 118 mL. Mild cortical thinning. Echogenicity within normal limits. No mass or hydronephrosis visualized. Left Kidney: Renal measurements: 9.2 x 5.1 x 5.0 cm = volume: 124 mL. Mild cortical thinning. Echogenicity within normal limits. No mass or hydronephrosis visualized. Bladder: Appears normal for degree of bladder distention. Other: None. IMPRESSION: 1. No acute abnormality.  No hydronephrosis. 2. Mild bilateral renal cortical thinning, in keeping with medical renal disease. Electronically Signed   By: Delanna Ahmadi M.D.   On: 12/29/2021 11:36        Scheduled Meds:  amiodarone  200 mg Oral Daily   buprenorphine-naloxone  1 tablet Sublingual Daily   Chlorhexidine Gluconate Cloth  6 each Topical Q0600   docusate sodium  100 mg Oral BID   fluticasone furoate-vilanterol  1 puff Inhalation Daily   gabapentin  300 mg  Oral TID   mouth rinse  15 mL Mouth Rinse BID   metoprolol succinate  12.5 mg Oral Daily   mupirocin ointment  1 application  Nasal BID   sodium chloride  flush  3 mL Intravenous Q12H   sodium zirconium cyclosilicate  10 g Oral BID   Continuous Infusions:  sodium chloride     cefTRIAXone (ROCEPHIN)  IV Stopped (12/29/21 1920)   DAPTOmycin (CUBICIN) 600 mg in sodium chloride 0.9 % IVPB Stopped (12/29/21 2234)     LOS: 4 days   Time spent= 35 mins    Statia Burdick Donevin Loader, MD [facility]  If 7PM-7AM, please contact night-coverage  12/30/2021, 7:36 AM

## 2021-12-30 NOTE — Progress Notes (Signed)
   12/30/21 1200  Notify: Provider  Provider response No new orders  Date of Provider Response 12/30/21  Time of Provider Response 1200

## 2021-12-30 NOTE — Progress Notes (Signed)
   12/30/21 1145  Treat  Complains of Fever  Interventions Medication (see MAR)  Notify: Charge Nurse/RN  Time Charge Nurse/RN Notified 1749  Notify: Provider  Provider Name/Title Dr. Reesa Chew  Date Provider Notified 12/30/21  Time Provider Notified 1145  Method of Notification Page (secure chat)  Notification Reason Other (Comment) (yellow muse temp elevated)

## 2021-12-31 DIAGNOSIS — M4646 Discitis, unspecified, lumbar region: Secondary | ICD-10-CM | POA: Diagnosis not present

## 2021-12-31 DIAGNOSIS — N179 Acute kidney failure, unspecified: Secondary | ICD-10-CM | POA: Diagnosis not present

## 2021-12-31 LAB — CBC
HCT: 39.2 % (ref 39.0–52.0)
Hemoglobin: 12 g/dL — ABNORMAL LOW (ref 13.0–17.0)
MCH: 30.6 pg (ref 26.0–34.0)
MCHC: 30.6 g/dL (ref 30.0–36.0)
MCV: 100 fL (ref 80.0–100.0)
Platelets: 246 10*3/uL (ref 150–400)
RBC: 3.92 MIL/uL — ABNORMAL LOW (ref 4.22–5.81)
RDW: 18.7 % — ABNORMAL HIGH (ref 11.5–15.5)
WBC: 14 10*3/uL — ABNORMAL HIGH (ref 4.0–10.5)
nRBC: 0 % (ref 0.0–0.2)

## 2021-12-31 LAB — BLOOD GAS, ARTERIAL
Acid-base deficit: 0.8 mmol/L (ref 0.0–2.0)
Bicarbonate: 24.9 mmol/L (ref 20.0–28.0)
Drawn by: 22052
FIO2: 40 %
O2 Content: 5 L/min
O2 Saturation: 98.2 %
Patient temperature: 37
pCO2 arterial: 44 mmHg (ref 32–48)
pH, Arterial: 7.36 (ref 7.35–7.45)
pO2, Arterial: 78 mmHg — ABNORMAL LOW (ref 83–108)

## 2021-12-31 LAB — BASIC METABOLIC PANEL
Anion gap: 6 (ref 5–15)
BUN: 86 mg/dL — ABNORMAL HIGH (ref 8–23)
CO2: 25 mmol/L (ref 22–32)
Calcium: 8.4 mg/dL — ABNORMAL LOW (ref 8.9–10.3)
Chloride: 112 mmol/L — ABNORMAL HIGH (ref 98–111)
Creatinine, Ser: 3.18 mg/dL — ABNORMAL HIGH (ref 0.61–1.24)
GFR, Estimated: 20 mL/min — ABNORMAL LOW (ref >60)
Glucose, Bld: 125 mg/dL — ABNORMAL HIGH (ref 70–99)
Potassium: 4.3 mmol/L (ref 3.5–5.1)
Sodium: 143 mmol/L (ref 135–145)

## 2021-12-31 LAB — CK: Total CK: 1018 U/L — ABNORMAL HIGH (ref 49–397)

## 2021-12-31 MED ORDER — FENTANYL CITRATE PF 50 MCG/ML IJ SOSY
12.5000 ug | PREFILLED_SYRINGE | INTRAMUSCULAR | Status: DC | PRN
  Administered 2022-01-01 – 2022-01-02 (×2): 12.5 ug via INTRAVENOUS
  Filled 2021-12-31 (×2): qty 1

## 2021-12-31 MED ORDER — SODIUM CHLORIDE 0.9 % IV SOLN: INTRAVENOUS | Status: DC

## 2021-12-31 MED ORDER — SENNA 8.6 MG PO TABS
1.0000 | ORAL_TABLET | Freq: Every day | ORAL | Status: DC
  Administered 2021-12-31 – 2022-01-05 (×6): 8.6 mg via ORAL
  Filled 2021-12-31 (×6): qty 1

## 2021-12-31 MED ORDER — MORPHINE SULFATE (PF) 2 MG/ML IV SOLN
0.5000 mg | INTRAVENOUS | Status: DC | PRN
Start: 2021-12-31 — End: 2021-12-31

## 2021-12-31 MED ORDER — GABAPENTIN 100 MG PO CAPS
100.0000 mg | ORAL_CAPSULE | Freq: Three times a day (TID) | ORAL | Status: DC
  Administered 2021-12-31 – 2022-01-01 (×5): 100 mg via ORAL
  Filled 2021-12-31 (×5): qty 1

## 2021-12-31 MED ORDER — SODIUM CHLORIDE 0.9 % IV SOLN
300.0000 mg | Freq: Two times a day (BID) | INTRAVENOUS | Status: DC
  Administered 2021-12-31 (×2): 300 mg via INTRAVENOUS
  Filled 2021-12-31 (×3): qty 15

## 2021-12-31 NOTE — Progress Notes (Signed)
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  Neurosurgery was consulted who recommended IR consultation for drainage and culture of fluid.  Patient underwent biopsy 6/12, antibiotics started.  Hospital course has also been complicated by hypoxia and acute kidney injury secondary to urinary retention.  Foley catheter has been placed.   Assessment & Plan:   Principal Problem:   Discitis of lumbar region Active Problems:   Atrial fibrillation (HCC)   Alcoholism (HCC)   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HCC)   Myeloproliferative neoplasm (HCC)   COPD (chronic obstructive pulmonary disease) (HCC)   PAD (peripheral artery disease) (HCC)   Aortic atherosclerosis (HCC)   CAD S/P percutaneous coronary angioplasty   S/P mitral valve clip implantation  1-Lower back pain secondary to acute osteomyelitis/discitis L3-L4 -MRI showed evidence of osteomyelitis/discitis -Status post IR biopsy 6/12. -Now on Teflaro. IV -Ceftriaxone and daptomycin discontinued 6/14 due to concern of elevation in CK  2-AKI on CKD stage IIIb: Secondary to acute urinary retention CR  baseline 2.2 Creatinine peaked to 4.2 After Foley catheter placed renal function has improved. Continue with IV fluids due to increased CK level  3-Hyperkalemia: Received Lokelma x3 4-Acute hypoxemia: Continues to be on 2 3 L of oxygen. Probably undiagnosed sleep apnea.  Continue with CPAP as a chef Continue to hold diuretics due to  AKI.  Monitor on gentle hydration  Twitching; chronic for more than 4 months.  Monitor.  ABG negative for hypercapnia.  Mild hypoxemia , placed on oxygen.   Congestive heart failure reduced ejection fraction 35% Compensated Continue with Toprol Holding Farxiga, Aldactone, torsemide due to AKI   CAD status post PCI to mid LAD 09/2020 - On Eliquis and statin.   Severe mitral regurgitation status post TAVR/MitraClip Severe TR/aortic insufficiency - S/P MitraClip placement 11/2021.  back on eliquis - Tricuspid and aortic valve under surveillance by cardiology per cardiology  Cultures are positive, then he will need TEE. Appreciate his recs.   Persistent atrial fibrillation/flutter status post ablation -On Toprol  History of COPD - Bronchodilators.  I-S/flutter valve.   Obstructive sleep apnea - Likely undiagnosed outpatient.   CPAP nightly     Estimated body mass index is 33.78 kg/m as calculated from the following:   Height as of this encounter: '5\' 6"'$  (1.676 m).   Weight as of this encounter: 94.9 kg.   DVT prophylaxis: Eliquis Code Status: Full code Family Communication: care discussed with patient Disposition Plan:  Status is: Inpatient Remains inpatient appropriate because: management of discitis    Consultants:  ID  Procedures:  IR lumbar aspiration   Antimicrobials:    Subjective: He report back pain, worse with movement. Denies worsening dyspnea. He report twitching for more than 5 month   Objective: Vitals:   12/31/21 1235 12/31/21 1417 12/31/21 1421 12/31/21 1612  BP: 127/84 118/62 118/62   Pulse: 85 82 84   Resp: '16 15 16   '$ Temp: 99.3 F (37.4 C) (!) 100.5 F (38.1 C) (!) 100.5 F (38.1 C) 98.8 F (37.1 C)  TempSrc: Oral  Oral  Oral  SpO2: 92% 100% 100%   Weight:      Height:        Intake/Output Summary (Last 24 hours) at 12/31/2021 1635 Last data filed at 12/31/2021 1400 Gross per 24 hour  Intake 1607.02 ml  Output 750 ml  Net 857.02  ml   Filed Weights   12/26/21 2114 12/27/21 0505 12/29/21 0500  Weight: 93.3 kg 93.3 kg 94.9 kg    Examination:  General exam: Appears calm and comfortable  Respiratory system: Clear to auscultation. Respiratory effort normal. Cardiovascular system: S1 & S2 heard, RRR.  Gastrointestinal system: Abdomen is nondistended, soft and nontender. No organomegaly or masses felt. Normal bowel sounds heard. Central nervous system: Alert and oriented.  Extremities: Symmetric 5 x 5 power.   Data Reviewed: I have personally reviewed following labs and imaging studies  CBC: Recent Labs  Lab 12/26/21 1512 12/27/21 0055 12/28/21 0550 12/29/21 0446 12/30/21 0439 12/31/21 0522  WBC 13.4* 11.9* 13.7* 20.6* 17.1* 14.0*  NEUTROABS 10.6*  --   --   --   --   --   HGB 16.0  16.7 15.1 14.4 14.2 13.3 12.0*  HCT 50.7  49.0 48.2 45.6 44.7 42.8 39.2  MCV 96.0 97.2 96.6 97.2 99.1 100.0  PLT 276 236 251 268 250 716   Basic Metabolic Panel: Recent Labs  Lab 12/27/21 0055 12/28/21 0550 12/29/21 0446 12/30/21 0439 12/31/21 0522  NA 140 139 142 140 143  K 4.4 4.1 4.7 5.9* 4.3  CL 100 101 103 103 112*  CO2 '30 25 27 26 25  '$ GLUCOSE 116* 127* 138* 138* 125*  BUN 44* 53* 67* 77* 86*  CREATININE 2.12* 2.80* 4.25* 3.87* 3.18*  CALCIUM 8.3* 8.1* 8.8* 8.5* 8.4*  MG  --  2.4  --   --   --    GFR: Estimated Creatinine Clearance: 22 mL/min (A) (by C-G formula based on SCr of 3.18 mg/dL (H)). Liver Function Tests: Recent Labs  Lab 12/27/21 0055  AST 20  ALT 26  ALKPHOS 116  BILITOT 0.9  PROT 7.0  ALBUMIN 3.0*   No results for input(s): "LIPASE", "AMYLASE" in the last 168 hours. No results for input(s): "AMMONIA" in the last 168 hours. Coagulation Profile: Recent Labs  Lab 12/27/21 0055  INR 1.1   Cardiac Enzymes: Recent Labs  Lab 12/30/21 0439 12/31/21 0522  CKTOTAL 509* 1,018*   BNP (last 3 results) No results for input(s): "PROBNP" in the last 8760 hours. HbA1C: No results for  input(s): "HGBA1C" in the last 72 hours. CBG: No results for input(s): "GLUCAP" in the last 168 hours. Lipid Profile: No results for input(s): "CHOL", "HDL", "LDLCALC", "TRIG", "CHOLHDL", "LDLDIRECT" in the last 72 hours. Thyroid Function Tests: No results for input(s): "TSH", "T4TOTAL", "FREET4", "T3FREE", "THYROIDAB" in the last 72 hours. Anemia Panel: No results for input(s): "VITAMINB12", "FOLATE", "FERRITIN", "TIBC", "IRON", "RETICCTPCT" in the last 72 hours. Sepsis Labs: Recent Labs  Lab 12/26/21 2207 12/27/21 0055  PROCALCITON <0.10  --   LATICACIDVEN 1.5 1.6    Recent Results (from the past 240 hour(s))  MRSA Next Gen by PCR, Nasal     Status: Abnormal   Collection Time: 12/26/21 10:26 PM   Specimen: Nasal Mucosa; Nasal Swab  Result Value Ref Range Status   MRSA by PCR Next Gen DETECTED (A) NOT DETECTED Final    Comment: RESULT CALLED TO, READ BACK BY AND VERIFIED WITH: Lear Ng, RN 12/27/2021 @ [place] (NOTE) The GeneXpert  MRSA Assay (FDA approved for NASAL specimens only), is one component of a comprehensive MRSA colonization surveillance program. It is not intended to diagnose MRSA infection nor to guide or monitor treatment for MRSA infections. Test performance is not FDA approved in patients less than 79 years old. Performed at [facility], [place] [address] [place], [place] [address]   Culture, blood (Routine X 2) w Reflex to ID Panel     Status: None (Preliminary result)   Collection Time: 12/27/21 12:55 AM   Specimen: BLOOD  Result Value Ref Range Status   Specimen Description   Final    BLOOD BLOOD RIGHT HAND Performed at [facility] [address] [place], [place] [address]    Special Requests   Final    BOTTLES DRAWN AEROBIC ONLY Blood Culture results may not be optimal due to an inadequate volume of blood received in culture bottles Performed at [facility] [address], [place],  [place] [address]    Culture   Final    NO GROWTH 4 DAYS Performed at [facility], [place] [address], [place], [place] [address]    Report Status PENDING  Incomplete  Culture, blood (Routine X 2) w Reflex to ID Panel     Status: None (Preliminary result)   Collection Time: 12/27/21 12:55 AM   Specimen: BLOOD  Result Value Ref Range Status   Specimen Description   Final    BLOOD RIGHT ANTECUBITAL Performed at [facility] [address], [place], [place] [address]    Special Requests   Final    BOTTLES DRAWN AEROBIC ONLY Blood Culture adequate volume Performed at [facility] [place] [address] [place], [place] [address]    Culture   Final    NO GROWTH 4 DAYS Performed at [facility], [place] [address] [place], [place] [address]    Report Status PENDING  Incomplete  Culture, blood (Routine X 2) w Reflex to ID Panel     Status: None (Preliminary result)   Collection Time: 12/29/21  8:04 AM   Specimen: BLOOD  Result Value Ref Range Status   Specimen Description   Final    BLOOD RIGHT ANTECUBITAL Performed at [facility] [address] [place], [place] [address]    Special Requests   Final    IN PEDIATRIC BOTTLE Blood Culture results may not be optimal due to an inadequate volume of blood received in culture bottles Performed at [facility] [address], [place], [place] [address]    Culture   Final    NO GROWTH 2 DAYS Performed at [facility] [address], [place], [place] [address]    Report Status PENDING  Incomplete  Culture, blood (Routine X 2) w Reflex to ID Panel     Status: None (Preliminary result)   Collection Time: 12/29/21  8:10 AM   Specimen: BLOOD  Result Value Ref Range Status   Specimen Description   Final    BLOOD BLOOD LEFT HAND Performed at [facility] [address] [place], [place] [address]    Special Requests   Final    BOTTLES DRAWN  AEROBIC ONLY Blood Culture results may not be optimal due to an inadequate volume of blood received in culture bottles Performed at [facility] [address], [place], [place] [address]    Culture   Final  NO GROWTH 2 DAYS Performed at [facility], [place] [address] [place], [place] [address]    Report Status PENDING  Incomplete  Urine Culture     Status: None   Collection Time: 12/29/21  8:44 AM   Specimen: Urine, Clean Catch  Result Value Ref Range Status   Specimen Description   Final    URINE, CLEAN CATCH Performed at [facility], [place] [address], [place], [place] [address]    Special Requests   Final    NONE Performed at [facility], [place] [address] [place], [place] [address]    Culture   Final    NO GROWTH Performed at [facility], [place] [address] [place], [place] [address]    Report Status 12/30/2021 FINAL  Final  Aerobic/Anaerobic Culture w Gram Stain (surgical/deep wound)     Status: None (Preliminary result)   Collection Time: 12/29/21  4:34 PM   Specimen: Wound; Abscess  Result Value Ref Range Status   Specimen Description   Final    WOUND Performed at [facility] [address] [place], [place] [address]    Special Requests   Final    ABSCESS Performed at [facility], [place] [address], [place], [place] [address]    Gram Stain   Final    MODERATE WBC PRESENT, PREDOMINANTLY PMN NO ORGANISMS SEEN    Culture   Final    NO GROWTH 2 DAYS NO ANAEROBES ISOLATED; CULTURE IN PROGRESS FOR 5 DAYS Performed at [facility] [address], [place], [place] [address]    Report Status PENDING  Incomplete         Radiology Studies: DG CHEST PORT 1 VIEW  Result Date: 12/30/2021 CLINICAL DATA:  76 year old male with respiratory compromise. Atrial fibrillation. EXAM: PORTABLE CHEST 1 VIEW COMPARISON:  Chest radiographs 11/18/2021 and earlier.  FINDINGS: Portable AP semi upright view at 0348 hours. Cardiac and mediastinal contours are stable and within normal limits. Left chest loop recorder or lead less ICD is unchanged. Visualized tracheal air column is within normal limits. Stable lung markings. Allowing for portable technique the lungs are clear. No pneumothorax or pleural effusion. Bulky degeneration at the right shoulder glenohumeral joint. No acute osseous abnormality identified. Negative visible bowel gas. IMPRESSION: No acute cardiopulmonary abnormality. Electronically Signed   By: Genevie Ann M.D.   On: 12/30/2021 04:25   IR LUMBAR DISC ASPIRATION W/IMG GUIDE  Result Date: 12/29/2021 INDICATION: 76 year old male with L3-L4 osteomyelitis discitis. He presents for disc aspiration. EXAM: Disc aspiration, lumbar MEDICATIONS: The patient is currently admitted to the hospital and receiving intravenous antibiotics. The antibiotics were administered within an appropriate time frame prior to the initiation of the procedure. ANESTHESIA/SEDATION: None. COMPLICATIONS: None immediate. PROCEDURE: Informed written consent was obtained from the patient after a thorough discussion of the procedural risks, benefits and alternatives. All questions were addressed. Maximal Sterile Barrier Technique was utilized including caps, mask, sterile gowns, sterile gloves, sterile drape, hand hygiene and skin antiseptic. A timeout was performed prior to the initiation of the procedure. The L3-L4 disc space was identified fluoroscopically. An RPO angulation was selected. The skin was marked and prepped in standard sterile fashion using Betadine skin prep. Local anesthesia was attained by infiltration with 1% lidocaine. Under real-time fluoroscopy, a curved 22 gauge 15 cm Chiba needle was carefully advanced through the soft tissues and used to enter into the disc space. Aspiration was performed yielding approximately  0.5 mL of purulent fluid. This was sent for Gram stain and  culture. IMPRESSION: Successful L3-L4 disc aspiration yielding 0.5 mL purulent fluid. Electronically Signed   By: Jacqulynn Cadet M.D.   On: 12/29/2021 16:54        Scheduled Meds:  amiodarone  200 mg Oral Daily   apixaban  5 mg Oral BID   buprenorphine-naloxone  1 tablet Sublingual Daily   Chlorhexidine Gluconate Cloth  6 each Topical Q0600   docusate sodium  100 mg Oral BID   fluticasone furoate-vilanterol  1 puff Inhalation Daily   gabapentin  100 mg Oral TID   mouth rinse  15 mL Mouth Rinse BID   metoprolol succinate  12.5 mg Oral Daily   mupirocin ointment  1 application  Nasal BID   senna  1 tablet Oral Daily   sodium chloride flush  3 mL Intravenous Q12H   Continuous Infusions:  sodium chloride 100 mL/hr at 12/31/21 1228   ceFTAROline (TEFLARO) IV 300 mg (12/31/21 1230)     LOS: 5 days    Time spent: 35 minutes    Ayiana Winslett A Ocie Stanzione, MD [facility]   If 7PM-7AM, please contact night-coverage [email or web address]  12/31/2021, 4:35 PM

## 2021-12-31 NOTE — Plan of Care (Signed)
  Problem: Clinical Measurements: Goal: Ability to maintain clinical measurements within normal limits will improve Outcome: Progressing Goal: Will remain free from infection 12/31/2021 0212 by Lennie Hummer, RN Outcome: Progressing 12/31/2021 0211 by Lennie Hummer, RN Outcome: Progressing Goal: Diagnostic test results will improve Outcome: Progressing Goal: Respiratory complications will improve Outcome: Progressing   Problem: Coping: Goal: Level of anxiety will decrease Outcome: Progressing   Problem: Pain Managment: Goal: General experience of comfort will improve Outcome: Progressing

## 2021-12-31 NOTE — Progress Notes (Signed)
PT Cancellation Note  Patient Details Name: [name] MRN: [id number] DOB: [birth year]   Cancelled Treatment:    Reason Eval/Treat Not Completed: Pain limiting ability to participate (RN requested PT hold today 2* pt is lethargic and is not able to tolerate activity 2* pain. Will follow.)  Philomena Doheny PT 12/31/2021  Acute Rehabilitation Services  Office [phone]

## 2021-12-31 NOTE — Progress Notes (Signed)
[facility] for Infectious Disease   Reason for visit: follow up on discitis  Interval History: WBC 14, down from a peak of 20.6. Fever trending down.   Day 3 daptomcyin + ceftriaxone  Physical Exam: Constitutional:  Vitals:   12/31/21 1044 12/31/21 1235  BP:  127/84  Pulse: (!) 101 85  Resp: 12 16  Temp: 97.8 F (36.6 C) 99.3 F (37.4 C)  SpO2: 98% 92%  Patient is alert, nad Respiratory: normal respiratory effort Neuro: no arm twitching   Review of Systems: Gastrointestinal: negative for n/v/d.  Lab Results  Component Value Date   WBC 14.0 (H) 12/31/2021   HGB 12.0 (L) 12/31/2021   HCT 39.2 12/31/2021   MCV 100.0 12/31/2021   PLT 246 12/31/2021    Lab Results  Component Value Date   CREATININE 3.18 (H) 12/31/2021   BUN 86 (H) 12/31/2021   NA 143 12/31/2021   K 4.3 12/31/2021   CL 112 (H) 12/31/2021   CO2 25 12/31/2021    Lab Results  Component Value Date   ALT 26 12/27/2021   AST 20 12/27/2021   ALKPHOS 116 12/27/2021     Microbiology: Recent Results (from the past 240 hour(s))  MRSA Next Gen by PCR, Nasal     Status: Abnormal   Collection Time: 12/26/21 10:26 PM   Specimen: Nasal Mucosa; Nasal Swab  Result Value Ref Range Status   MRSA by PCR Next Gen DETECTED (A) NOT DETECTED Final    Comment: RESULT CALLED TO, READ BACK BY AND VERIFIED WITH: Lear Ng, RN 12/27/2021 @ 92 [facility] (NOTE) The GeneXpert MRSA Assay (FDA approved for NASAL specimens only), is one component of a comprehensive MRSA colonization surveillance program. It is not intended to diagnose MRSA infection nor to guide or monitor treatment for MRSA infections. Test performance is not FDA approved in patients less than 66 years old. Performed at [facility], [place] [address] [place], [place] [address]   Culture, blood (Routine X 2) w Reflex to ID Panel     Status: None (Preliminary result)   Collection Time: 12/27/21 12:55 AM   Specimen: BLOOD  Result  Value Ref Range Status   Specimen Description   Final    BLOOD BLOOD RIGHT HAND Performed at [facility] [address], [place], [place] [address]    Special Requests   Final    BOTTLES DRAWN AEROBIC ONLY Blood Culture results may not be optimal due to an inadequate volume of blood received in culture bottles Performed at [facility] [address] [place], [place] [address]    Culture   Final    NO GROWTH 4 DAYS Performed at [facility], [place] [address], [place], [place] [address]    Report Status PENDING  Incomplete  Culture, blood (Routine X 2) w Reflex to ID Panel     Status: None (Preliminary result)   Collection Time: 12/27/21 12:55 AM   Specimen: BLOOD  Result Value Ref Range Status   Specimen Description   Final    BLOOD RIGHT ANTECUBITAL Performed at [facility] [address] [place], [place] [address]    Special Requests   Final    BOTTLES DRAWN AEROBIC ONLY Blood Culture adequate volume Performed at [facility] [address] [place],  [address]    Culture   Final    NO GROWTH 4 DAYS Performed at [facility], [address] [place], [place] [address]    Report Status PENDING  Incomplete  Culture, blood (Routine X 2) w Reflex to ID Panel     Status: None (Preliminary result)   Collection Time: 12/29/21  8:04 AM   Specimen: BLOOD  Result Value Ref Range Status   Specimen Description   Final    BLOOD RIGHT ANTECUBITAL Performed at [facility] [address], [place], [place] [address]    Special Requests   Final    IN PEDIATRIC BOTTLE Blood Culture results may not be optimal due to an inadequate volume of blood received in culture bottles Performed at [facility] [address] [place], [place] [address]    Culture   Final    NO GROWTH 2 DAYS Performed at [place] [address] [place], [place]  [address]    Report Status PENDING  Incomplete  Culture, blood (Routine X 2) w Reflex to ID Panel     Status: None (Preliminary result)   Collection Time: 12/29/21  8:10 AM   Specimen: BLOOD  Result Value Ref Range Status   Specimen Description   Final    BLOOD BLOOD LEFT HAND Performed at [facility] [address], [place], [place] [address]    Special Requests   Final    BOTTLES DRAWN AEROBIC ONLY Blood Culture results may not be optimal due to an inadequate volume of blood received in culture bottles Performed at [facility] [address] [place], [place] [address]    Culture   Final    NO GROWTH 2 DAYS Performed at [facility] [address], [place], [place] [address]    Report Status PENDING  Incomplete  Urine Culture     Status: None   Collection Time: 12/29/21  8:44 AM   Specimen: Urine, Clean Catch  Result Value Ref Range Status   Specimen Description   Final    URINE, CLEAN CATCH Performed at [facility], [place] [address] [place], [place] [address]    Special Requests   Final    NONE Performed at [facility], [place] [address] [place], [place] [address]    Culture   Final    NO GROWTH Performed at [facility], [place] [address] [place], [place] [address]    Report Status 12/30/2021 FINAL  Final  Aerobic/Anaerobic Culture w Gram Stain (surgical/deep wound)     Status: None (Preliminary result)   Collection Time: 12/29/21  4:34 PM   Specimen: Wound; Abscess  Result Value Ref Range Status   Specimen Description   Final    WOUND Performed at [facility] [address], [place], [place] [address]    Special Requests   Final    ABSCESS Performed at [facility], [place] [address], [place], [place] [address]    Gram Stain   Final    MODERATE WBC PRESENT, PREDOMINANTLY PMN NO ORGANISMS SEEN    Culture   Final    NO GROWTH 2  DAYS Performed at [facility] [address], [place],  [address]    Report Status PENDING  Incomplete    Impression/Plan:  1. Lumbar discitis/osteomyelitis - culture from his aspiration remains no growth to date.  He remains on antibiotics and will continue and monitor the cultures.    2.  Medication monitoring - CK more elevated now on daptomycin  so will stop the daptomycin and transition him to IV ceftaroline.    3. Acute renal failure - creat with some improvement today.  Will avoid nephrotoxic medications like vancomycin.  Will continue to monitor.

## 2021-12-31 NOTE — Plan of Care (Signed)
  Problem: Nutrition: Goal: Adequate nutrition will be maintained Outcome: Progressing   Problem: Elimination: Goal: Will not experience complications related to bowel motility Outcome: Progressing   Problem: Pain Managment: Goal: General experience of comfort will improve Outcome: Progressing   

## 2022-01-01 DIAGNOSIS — M4646 Discitis, unspecified, lumbar region: Secondary | ICD-10-CM | POA: Diagnosis not present

## 2022-01-01 LAB — CBC
HCT: 37.8 % — ABNORMAL LOW (ref 39.0–52.0)
Hemoglobin: 11.7 g/dL — ABNORMAL LOW (ref 13.0–17.0)
MCH: 30.6 pg (ref 26.0–34.0)
MCHC: 31 g/dL (ref 30.0–36.0)
MCV: 99 fL (ref 80.0–100.0)
Platelets: 220 10*3/uL (ref 150–400)
RBC: 3.82 MIL/uL — ABNORMAL LOW (ref 4.22–5.81)
RDW: 18.4 % — ABNORMAL HIGH (ref 11.5–15.5)
WBC: 11.6 10*3/uL — ABNORMAL HIGH (ref 4.0–10.5)
nRBC: 0 % (ref 0.0–0.2)

## 2022-01-01 LAB — CULTURE, BLOOD (ROUTINE X 2)
Culture: NO GROWTH
Culture: NO GROWTH
Special Requests: ADEQUATE

## 2022-01-01 LAB — URIC ACID: Uric Acid, Serum: 12 mg/dL — ABNORMAL HIGH (ref 3.7–8.6)

## 2022-01-01 LAB — BASIC METABOLIC PANEL
Anion gap: 7 (ref 5–15)
BUN: 74 mg/dL — ABNORMAL HIGH (ref 8–23)
CO2: 23 mmol/L (ref 22–32)
Calcium: 8 mg/dL — ABNORMAL LOW (ref 8.9–10.3)
Chloride: 113 mmol/L — ABNORMAL HIGH (ref 98–111)
Creatinine, Ser: 2.18 mg/dL — ABNORMAL HIGH (ref 0.61–1.24)
GFR, Estimated: 31 mL/min — ABNORMAL LOW (ref >60)
Glucose, Bld: 123 mg/dL — ABNORMAL HIGH (ref 70–99)
Potassium: 3.9 mmol/L (ref 3.5–5.1)
Sodium: 143 mmol/L (ref 135–145)

## 2022-01-01 LAB — CK: Total CK: 758 U/L — ABNORMAL HIGH (ref 49–397)

## 2022-01-01 MED ORDER — SORBITOL 70 % SOLN
30.0000 mL | Freq: Once | Status: AC
Start: 2022-01-01 — End: 2022-01-01
  Administered 2022-01-01: 30 mL via ORAL
  Filled 2022-01-01: qty 30

## 2022-01-01 MED ORDER — SODIUM CHLORIDE 0.9 % IV SOLN
400.0000 mg | Freq: Two times a day (BID) | INTRAVENOUS | Status: DC
Start: 2022-01-01 — End: 2022-01-01
  Filled 2022-01-01: qty 20

## 2022-01-01 MED ORDER — COLCHICINE 0.3 MG HALF TABLET
0.3000 mg | ORAL_TABLET | Freq: Once | ORAL | Status: AC
  Administered 2022-01-01: 0.3 mg via ORAL
  Filled 2022-01-01: qty 1

## 2022-01-01 MED ORDER — BISACODYL 10 MG RE SUPP
10.0000 mg | Freq: Once | RECTAL | Status: AC
  Administered 2022-01-01: 10 mg via RECTAL
  Filled 2022-01-01: qty 1

## 2022-01-01 MED ORDER — SODIUM CHLORIDE 0.9 % IV SOLN
400.0000 mg | Freq: Two times a day (BID) | INTRAVENOUS | Status: DC
  Administered 2022-01-01 – 2022-01-03 (×4): 400 mg via INTRAVENOUS
  Filled 2022-01-01 (×13): qty 20

## 2022-01-01 MED ORDER — ALLOPURINOL 300 MG PO TABS
150.0000 mg | ORAL_TABLET | Freq: Every day | ORAL | Status: DC
  Administered 2022-01-01 – 2022-01-05 (×5): 150 mg via ORAL
  Filled 2022-01-01 (×5): qty 1

## 2022-01-01 MED ORDER — SODIUM CHLORIDE 0.9 % IV SOLN
300.0000 mg | Freq: Two times a day (BID) | INTRAVENOUS | Status: AC
Start: 2022-01-01 — End: 2022-01-01
  Administered 2022-01-01: 300 mg via INTRAVENOUS
  Filled 2022-01-01: qty 15

## 2022-01-01 NOTE — NC FL2 (Signed)
[place] LEVEL OF CARE SCREENING TOOL     IDENTIFICATION  Patient Name: [name] Birthdate: May 22, 1946 Sex: male Admission Date (Current Location): 12/26/2021  [facility] and [place] Number:  Herbalist and Address:  [facility],  [place], [place]      Provider Number: [id number]  Attending Physician Name and Address:  Elmarie Shiley, MD  Relative Name and Phone Number:  [name] Miller(s/o) [phone]    Current Level of Care: Hospital Recommended Level of Care: [place] Prior Approval Number:    Date Approved/Denied:   PASRR Number: [id number]  Discharge Plan: SNF    Current Diagnoses: Patient Active Problem List   Diagnosis Date Noted   Discitis of lumbar region 12/26/2021   Cervical radiculopathy 12/24/2021   Acute combined systolic and diastolic heart failure ([place]) 11/21/2021   CAD S/P percutaneous coronary angioplasty 11/20/2021   S/P mitral valve clip implantation 11/20/2021   Severe mitral regurgitation    Acute on chronic heart failure ([place]) 08/22/2021   Opioid use disorder 08/22/2021   Cellulitis 04/26/2021   Venous stasis dermatitis of both lower extremities 04/26/2021   PAD (peripheral artery disease) ([place]) 04/26/2021   Aortic atherosclerosis ([place]) 04/26/2021   HLD (hyperlipidemia) 11/27/2020   Prediabetes 10/13/2020   Non-ST elevation (NSTEMI) myocardial infarction [facility])    Hx of adenomatous colonic polyps    Benign neoplasm of cecum    Benign neoplasm of ascending colon    Benign neoplasm of transverse colon    Benign neoplasm of descending colon    Benign neoplasm of sigmoid colon    Long term current use of amiodarone 01/24/2018   GI bleed 02/15/2017   Pancytopenia, acquired ([place]) 11/26/2016   Tubular adenoma 11/25/2016   Gout 11/02/2016   Hepatitis C antibody test positive    Acute kidney injury ([place]) 11/01/2014   Leukopenia due to antineoplastic  chemotherapy ([place]) 08/24/2014   Thrombocytopenia due to drugs 07/25/2014   Encounter for screening colonoscopy 07/02/2014   Chronic anticoagulation 07/02/2014   Acute pulmonary edema (HCC) 06/05/2014   Prolonged QT interval 06/05/2014   Sinus bradycardia 06/05/2014   Opioid dependence ([place]) 71/24/5809   Diastolic dysfunction 98/33/8250   COPD (chronic obstructive pulmonary disease) ([place]) 05/30/2014   Health care maintenance 05/24/2014   Acute respiratory failure with hypoxia and hypercapnia (HCC) 04/29/2014   Anemia due to antineoplastic chemotherapy 04/24/2014   Thyroid mass 08/26/2013   Myeloproliferative neoplasm ([place]) 08/15/2013   Polycythemia vera ([place]) 01/03/2013   Tobacco abuse 09/15/2012   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[place])    Alcoholism ([place]) 09/30/2010   Hypertension 09/30/2010   Atrial fibrillation (HCC)     Orientation RESPIRATION BLADDER Height & Weight     Self, Time, Situation, Place  O2 Continent Weight: 99.7 kg Height:  '5\' 6"'$  (167.6 cm)  BEHAVIORAL SYMPTOMS/MOOD NEUROLOGICAL BOWEL NUTRITION STATUS      Continent Diet (Heart Healthy)  AMBULATORY STATUS COMMUNICATION OF NEEDS Skin   Limited Assist Verbally Normal                       Personal Care Assistance Level of Assistance  Bathing, Feeding, Dressing   Feeding assistance: Limited assistance Dressing Assistance: Limited assistance     Functional Limitations Info  Sight, Hearing, Speech Sight Info: Adequate Hearing Info: Adequate Speech Info: Impaired (Dentures-top)    SPECIAL CARE FACTORS FREQUENCY  PT (By licensed PT), OT (  By licensed OT)     PT Frequency:  (5x week) OT Frequency:  (5x week)            Contractures Contractures Info: Not present    Additional Factors Info  Code Status, Allergies, Psychotropic Code Status Info:  (Full) Allergies Info:  (NKA) Psychotropic Info:  (suboxone see MAR)         Current Medications (01/01/2022):  This is  the current hospital active medication list Current Facility-Administered Medications  Medication Dose Route Frequency Provider Last Rate Last Admin   acetaminophen (TYLENOL) tablet 650 mg  650 mg Oral Q6H PRN Clance Boll, MD   650 mg at 12/31/21 9518   Or   acetaminophen (TYLENOL) suppository 650 mg  650 mg Rectal Q6H PRN Clance Boll, MD       allopurinol (ZYLOPRIM) tablet 150 mg  150 mg Oral Daily [name], Belkys A, MD   150 mg at 01/01/22 1534   amiodarone (PACERONE) tablet 200 mg  200 mg Oral Daily Amin, Ankit Chirag, MD   200 mg at 01/01/22 0941   apixaban (ELIQUIS) tablet 5 mg  5 mg Oral BID Amin, Ankit Chirag, MD   5 mg at 01/01/22 0941   buprenorphine-naloxone (SUBOXONE) 8-2 mg per SL tablet 1 tablet  1 tablet Sublingual Daily Myles Rosenthal A, MD   1 tablet at 01/01/22 0941   ceftaroline (TEFLARO) 400 mg in sodium chloride 0.9 % 100 mL IVPB  400 mg Intravenous Q12H Thayer Headings, MD       Chlorhexidine Gluconate Cloth 2 % PADS 6 each  6 each Topical Q0600 Damita Lack, MD   6 each at 01/01/22 0939   colchicine tablet 0.3 mg  0.3 mg Oral Once [name], Belkys A, MD       docusate sodium (COLACE) capsule 100 mg  100 mg Oral BID Amin, Ankit Chirag, MD   100 mg at 01/01/22 0941   fentaNYL (SUBLIMAZE) injection 12.5 mcg  12.5 mcg Intravenous Q4H PRN [name], Belkys A, MD   12.5 mcg at 01/01/22 1530   fluticasone furoate-vilanterol (BREO ELLIPTA) 200-25 MCG/ACT 1 puff  1 puff Inhalation Daily Myles Rosenthal A, MD   1 puff at 01/01/22 0841   gabapentin (NEURONTIN) capsule 100 mg  100 mg Oral TID [name], Belkys A, MD   100 mg at 01/01/22 0951   guaiFENesin (ROBITUSSIN) 100 MG/5ML liquid 5 mL  5 mL Oral Q4H PRN Amin, Ankit Chirag, MD       hydrALAZINE (APRESOLINE) injection 10 mg  10 mg Intravenous Q4H PRN Amin, Ankit Chirag, MD       HYDROcodone-acetaminophen (NORCO/VICODIN) 5-325 MG per tablet 1-2 tablet  1-2 tablet Oral Q4H PRN Myles Rosenthal A, MD   2  tablet at 01/01/22 1330   ipratropium-albuterol (DUONEB) 0.5-2.5 (3) MG/3ML nebulizer solution 3 mL  3 mL Nebulization Q4H PRN Amin, Ankit Chirag, MD   3 mL at 12/30/21 0339   lidocaine (PF) (XYLOCAINE) 1 % injection    PRN Criselda Peaches, MD   15 mL at 12/29/21 1557   MEDLINE mouth rinse  15 mL Mouth Rinse BID Amin, Ankit Chirag, MD   15 mL at 01/01/22 1214   metoprolol succinate (TOPROL-XL) 24 hr tablet 12.5 mg  12.5 mg Oral Daily Myles Rosenthal A, MD   12.5 mg at 01/01/22 0941   metoprolol tartrate (LOPRESSOR) injection 5 mg  5 mg Intravenous Q4H PRN Damita Lack, MD  mupirocin ointment (BACTROBAN) 2 % 1 application   1 application  Nasal BID Amin, Jeanella Flattery, MD   1 application  at 04/21/48 1214   senna (SENOKOT) tablet 8.6 mg  1 tablet Oral Daily [name], Belkys A, MD   8.6 mg at 01/01/22 0942   senna-docusate (Senokot-S) tablet 1 tablet  1 tablet Oral QHS PRN Myles Rosenthal A, MD       sodium chloride flush (NS) 0.9 % injection 3 mL  3 mL Intravenous Q12H Myles Rosenthal A, MD   3 mL at 12/29/21 2133   sorbitol 70 % solution 30 mL  30 mL Oral Once [name], Belkys A, MD         Discharge Medications: Please see discharge summary for a list of discharge medications.  Relevant Imaging Results:  Relevant Lab Results:   Additional Information SS#[id number];Vaccinated x2  Dessa Phi, RN

## 2022-01-01 NOTE — TOC Progression Note (Signed)
Transition of Care [facility]) - Progression Note    Patient Details  Name: Khalik Pewitt MRN: [id number] Date of Birth: 1945/11/25  Transition of [facility]) CM/SW Contact  Briley Bumgarner, Juliann Pulse, RN Phone Number: 01/01/2022, 3:57 PM  Clinical Narrative:  Patient in agreement to SNF-faxed out await bed offers.     Expected Discharge Plan: Skilled Nursing Facility Barriers to Discharge: Continued Medical Work up  Expected Discharge Plan and Services Expected Discharge Plan: [place]   Discharge Planning Services: CM Consult   Living arrangements for the past 2 months: Single Family Home                                       Social Determinants of Health (SDOH) Interventions    Readmission Risk Interventions    12/29/2021    3:08 PM 08/24/2021   10:25 AM 05/17/2019    1:32 PM  Readmission Risk Prevention Plan  Transportation Screening Complete Complete Complete  PCP or Specialist Appt within 3-5 Days Complete Complete   HRI or [place] Complete Complete   Social Work Consult for [place] Planning/Counseling Complete Complete   Palliative Care Screening Not Applicable Not Applicable   Medication Review Press photographer) Complete Complete Complete  PCP or Specialist appointment within 3-5 days of discharge   Patient refused  [place] or [place]   Patient refused  [place]   Not Applicable

## 2022-01-01 NOTE — Evaluation (Signed)
Physical Therapy Evaluation Patient Details Name: [name] MRN: [id number] DOB: [birth year] Today's Date: 01/01/2022  History of Present Illness  Pt is a 76yo male presenting [facility] ED on 12/26/21 with back pain and decreased ability to ambulate. MRI reveleaed R>L L3-L4 discitis; s/p lumbar disc aspiration L3-4 on 6/12. PMH: afib, CHF, COPD, HTN, GERD, gout, substance abuse, syncope, CAD/NSTEMI with multiple cardiac surgeries, MDS, CKDIII, polycythemia.   Clinical Impression  Pt presents with the problems above and functional impairments below. Pt received supine in bed during co-treatment with OT, pt reporting high levels of pain with gentle touch, unable to move extremities without sharp increase in pain, "15/10" also still reporting back pain. Limited evaluation to bed level as pt is unsafe to mobilize OOB at this time, notified MD of pain level and suspicion of gout flare-up. Completed BLE PROM exercises in which pt demonstrated increased range of motion while crying out in pain. Educated pt on the importance of movement and not letting the pain stop him as he will remain stiff and in pain without increased mobility, pt verbalized understanding. Assisted pt with meal setup and adjusted him in bed for comfort for lunch. Currently recommending SNF-level therapies upon discharge, it is possible that with improved pain control pt may be able to discharge with HHPT. We will continue to follow acutely.       Recommendations for follow up therapy are one component of a multi-disciplinary discharge planning process, led by the attending physician.  Recommendations may be updated based on patient status, additional functional criteria and insurance authorization.  Follow Up Recommendations Skilled nursing-short term rehab (<3 hours/day)    Assistance Recommended at Discharge Frequent or constant Supervision/Assistance  Patient can return home with the following  Two people to help with  walking and/or transfers;Two people to help with bathing/dressing/bathroom;Assistance with cooking/housework;Assistance with feeding;Assist for transportation;Help with stairs or ramp for entrance    Equipment Recommendations Other (comment) (TBD)  Recommendations for Other Services       Functional Status Assessment Patient has had a recent decline in their functional status and demonstrates the ability to make significant improvements in function in a reasonable and predictable amount of time.     Precautions / Restrictions Precautions Precautions: Fall Restrictions Weight Bearing Restrictions: No      Mobility  Bed Mobility               General bed mobility comments: deferred    Transfers                   General transfer comment: deferred    Ambulation/Gait               General Gait Details: deferred  Stairs            Wheelchair Mobility    Modified Rankin (Stroke Patients Only)       Balance                                             Pertinent Vitals/Pain Pain Assessment Pain Assessment: Faces Faces Pain Scale: Hurts worst Pain Location: Back, all extremity joints Pain Descriptors / Indicators: Crying, Discomfort, Grimacing, Moaning, Shooting Pain Intervention(s): Limited activity within patient's tolerance, Monitored during session, Repositioned    Home Living Family/patient expects to be discharged to:: Private residence Living Arrangements: Spouse/significant other;Other relatives (2 grandchildren (  teenagers)) Available Help at Discharge: Family;Available 24 hours/day Type of Home: House Home Access: Stairs to enter Entrance Stairs-Rails: Right;Left;Can reach both Entrance Stairs-Number of Steps: 3   Home Layout: One level Home Equipment: Cane - single point      Prior Function Prior Level of Function : Independent/Modified Independent;Driving             Mobility Comments: uses SPC at  baseline but he can't find it, uses RW ADLs Comments: pt reports he was on 2lnc at all times, he reports he was independent with ADL/IADL and functional mobility     Hand Dominance   Dominant Hand: Right (right hand twitching)    Extremity/Trunk Assessment   Upper Extremity Assessment Upper Extremity Assessment: Defer to OT evaluation    Lower Extremity Assessment Lower Extremity Assessment: RLE deficits/detail;LLE deficits/detail RLE Deficits / Details: Active motion extremely limited by pain to just a few degrees at all joints. Able to increase ROM with PROM, pt crying out in pain with muscular twitches and guarding during movements. MMT deferred RLE Sensation: WNL LLE Deficits / Details: Active motion extremely limited by pain to just a few degrees at all joints. Able to increase ROM with PROM, pt crying out in pain with muscular twitches and guarding during movements. MMT deferred LLE Sensation: WNL       Communication   Communication: No difficulties  Cognition Arousal/Alertness: Awake/alert Behavior During Therapy: WFL for tasks assessed/performed Overall Cognitive Status: Within Functional Limits for tasks assessed                                          General Comments General comments (skin integrity, edema, etc.): Pt on RA throughout session, SpO2 at  95-100%    Exercises General Exercises - Lower Extremity Ankle Circles/Pumps: AROM, Both, 5 reps, Supine Heel Slides: PROM, Both, 20 reps, Supine   Assessment/Plan    PT Assessment Patient needs continued PT services  PT Problem List Decreased strength;Decreased activity tolerance;Decreased range of motion;Decreased mobility;Decreased balance;Decreased coordination;Pain       PT Treatment Interventions DME instruction;Gait training;Stair training;Functional mobility training;Therapeutic activities;Therapeutic exercise;Balance training;Neuromuscular re-education;Patient/family  education;Wheelchair mobility training;Manual techniques    PT Goals (Current goals can be found in the Care Plan section)  Acute Rehab PT Goals Patient Stated Goal: Reduce pain PT Goal Formulation: With patient Time For Goal Achievement: 01/15/22 Potential to Achieve Goals: Fair    Frequency Min 3X/week     Co-evaluation PT/OT/SLP Co-Evaluation/Treatment: Yes Reason for Co-Treatment: Complexity of the patient's impairments (multi-system involvement) PT goals addressed during session: Mobility/safety with mobility OT goals addressed during session: ADL's and self-care       AM-PAC PT "6 Clicks" Mobility  Outcome Measure Help needed turning from your back to your side while in a flat bed without using bedrails?: Total Help needed moving from lying on your back to sitting on the side of a flat bed without using bedrails?: Total Help needed moving to and from a bed to a chair (including a wheelchair)?: Total Help needed standing up from a chair using your arms (e.g., wheelchair or bedside chair)?: Total Help needed to walk in hospital room?: Total Help needed climbing 3-5 steps with a railing? : Total 6 Click Score: 6    End of Session   Activity Tolerance: Patient limited by pain Patient left: in bed;with call bell/phone within reach Nurse Communication: Mobility  status;Patient requests pain meds PT Visit Diagnosis: Pain;Difficulty in walking, not elsewhere classified (R26.2) Pain - Right/Left:  (bilateral) Pain - part of body: Shoulder;Arm;Hand;Hip;Knee;Leg;Ankle and joints of foot (and low back)    Time: 2712-9290 PT Time Calculation (min) (ACUTE ONLY): 32 min   Charges:   PT Evaluation $PT Eval Moderate Complexity: 1 Mod PT Treatments $Therapeutic Exercise: 8-22 mins        Coolidge Breeze, PT, DPT [facility] Rehabilitation Department Office: [phone] Pager: [phone]  Coolidge Breeze 01/01/2022, 12:50 PM

## 2022-01-01 NOTE — Evaluation (Signed)
Occupational Therapy Evaluation Patient Details Name: [name] MRN: [id number] DOB: [birth year] Today's Date: 01/01/2022   History of Present Illness Pt is a 76yo male presenting to [facility] ED on 12/26/21 with back pain and decreased ability to ambulate. MRI reveleaed R>L L3-L4 discitis  6/12 lumbar disc aspiration L3-4  PMH: afib, CHF, COPD, HTN, GERD, gout, substance abuse, syncope, CAD/NSTEMI with multiple cardiac surgeries, MDS, CKDIII, polycythemia.   Clinical Impression   [name] Docter is a 76 year old man typically independent with ADLs and using a cane/RW at home for ambulation. On evaluation he is near total assist due to painful joints - in hand, wrist, elbow, shoulders and LEs. He was only able to tolerate PROM of joints and crying out in pain. He was able to feed himself once tray set up but he is unable to reach top of his head/face for grooming. Mobility deferred due to poor pain control. Patient will benefit from skilled OT services while in hospital to improve deficits and learn compensatory strategies as needed in order to return to PLOF.       Recommendations for follow up therapy are one component of a multi-disciplinary discharge planning process, led by the attending physician.  Recommendations may be updated based on patient status, additional functional criteria and insurance authorization.   Follow Up Recommendations  Skilled nursing-short term rehab (<3 hours/day)    Assistance Recommended at Discharge Frequent or constant Supervision/Assistance  Patient can return home with the following Two people to help with walking and/or transfers;A lot of help with bathing/dressing/bathroom;Assistance with cooking/housework;Assistance with feeding;Help with stairs or ramp for entrance;Assist for transportation    Functional Status Assessment  Patient has had a recent decline in their functional status and demonstrates the ability to make significant  improvements in function in a reasonable and predictable amount of time.  Equipment Recommendations  Other (comment) (TBD)    Recommendations for Other Services       Precautions / Restrictions Precautions Precautions: Fall Restrictions Weight Bearing Restrictions: No      Mobility   Balance    ADL either performed or assessed with clinical judgement   ADL Overall ADL's : Needs assistance/impaired Eating/Feeding: Set up;Bed level   Grooming: Set up;Bed level;Moderate assistance Grooming Details (indicate cue type and reason): able to reach lower face Upper Body Bathing: Maximal assistance;Bed level   Lower Body Bathing: Total assistance;Bed level   Upper Body Dressing : Bed level;Total assistance   Lower Body Dressing: Total assistance;Bed level   Toilet Transfer: Total assistance   Toileting- Clothing Manipulation and Hygiene: Total assistance;Bed level               Vision Patient Visual Report: No change from baseline       Perception     Praxis      Pertinent Vitals/Pain Pain Assessment Faces Pain Scale: Hurts worst Pain Location: Back, all extremity joints Pain Descriptors / Indicators: Crying, Discomfort, Grimacing, Moaning, Shooting Pain Intervention(s): Limited activity within patient's tolerance, Monitored during session, Repositioned     Hand Dominance Right (right hand twitching)   Extremity/Trunk Assessment Upper Extremity Assessment Upper Extremity Assessment: RUE deficits/detail;LUE deficits/detail RUE Deficits / Details: Unable to tolerate much movement of joints due to pain, able to achieve approx 45 shoulder flexion, grossly functional PROM of elbow,wrist and fingers. RUE Sensation: WNL RUE Coordination: decreased fine motor;decreased gross motor LUE Deficits / Details: Unable to tolerate much movement of joints due to pain, able to achieve approx  45 shoulder flexion, grossly functional PROM of elbow,wrist and fingers. LUE  Sensation: WNL LUE Coordination: decreased gross motor;decreased fine motor   Lower Extremity Assessment Lower Extremity Assessment: Defer to PT evaluation RLE Deficits / Details: Active motion extremely limited by pain to just a few degrees at all joints. Able to increase ROM with PROM, pt crying out in pain with muscular twitches and guarding during movements. MMT deferred RLE Sensation: WNL LLE Deficits / Details: Active motion extremely limited by pain to just a few degrees at all joints. Able to increase ROM with PROM, pt crying out in pain with muscular twitches and guarding during movements. MMT deferred LLE Sensation: WNL       Communication Communication Communication: No difficulties   Cognition Arousal/Alertness: Awake/alert Behavior During Therapy: WFL for tasks assessed/performed Overall Cognitive Status: Within Functional Limits for tasks assessed                                       General Comments  Pt on RA throughout session, SpO2 at  95-100%    Exercises     Shoulder Instructions      Home Living Family/patient expects to be discharged to:: Private residence Living Arrangements: Spouse/significant other;Other relatives (2 grandchildren (teenagers)) Available Help at Discharge: Family;Available 24 hours/day Type of Home: House Home Access: Stairs to enter [employer] of Steps: 3 Entrance Stairs-Rails: Right;Left;Can reach both Home Layout: One level     Bathroom Shower/Tub: Teacher, early years/pre: Standard Bathroom Accessibility: Yes   Home Equipment: Cane - single point          Prior Functioning/Environment Prior Level of Function : Independent/Modified Independent;Driving             Mobility Comments: uses SPC at baseline but he can't find it, uses RW ADLs Comments: pt reports he was on 2lnc at all times, he reports he was independent with ADL/IADL and functional mobility        OT Problem  List: Decreased strength;Decreased range of motion;Decreased activity tolerance;Decreased knowledge of use of DME or AE;Pain;Impaired UE functional use;Obesity;Increased edema      OT Treatment/Interventions: Self-care/ADL training;Therapeutic exercise;DME and/or AE instruction;Therapeutic activities;Balance training;Patient/family education    OT Goals(Current goals can be found in the care plan section) Acute Rehab OT Goals Patient Stated Goal: move without pain OT Goal Formulation: With patient Time For Goal Achievement: 01/15/22 Potential to Achieve Goals: Fair  OT Frequency: Min 2X/week    Co-evaluation PT/OT/SLP Co-Evaluation/Treatment: Yes (co-eval) Reason for Co-Treatment: Complexity of the patient's impairments (multi-system involvement) PT goals addressed during session: Mobility/safety with mobility OT goals addressed during session: Strengthening/ROM      AM-PAC OT "6 Clicks" Daily Activity     Outcome Measure Help from another person eating meals?: A Little Help from another person taking care of personal grooming?: A Lot Help from another person toileting, which includes using toliet, bedpan, or urinal?: Total Help from another person bathing (including washing, rinsing, drying)?: A Lot Help from another person to put on and taking off regular upper body clothing?: Total Help from another person to put on and taking off regular lower body clothing?: Total 6 Click Score: 10   End of Session Nurse Communication: Patient requests pain meds (MD notified of poor pain tolerance)  Activity Tolerance: Patient limited by pain Patient left: in bed;with call bell/phone within reach;with bed alarm set  OT  Visit Diagnosis: Pain                Time: 1120-1146 OT Time Calculation (min): 26 min Charges:  OT General Charges $OT Visit: 1 Visit OT Evaluation $OT Eval Low Complexity: 1 Low  Crawford Tamura, OTR/L [place]  Office [phone] Pager: [place] 01/01/2022, 12:57 PM

## 2022-01-01 NOTE — Clinical Note (Incomplete)
Introduction: 76 YO M presetned with abd and back pain that radiated down legs  History of present illness: Progressing oivet the last month. No fever, incodntinence, etc. Tried gabapentin with no luck  Past medical history/allergies/family history pertinent to current illness: PMH: afiob on eliquis, GERD, COPD, CHF, CKD  PTA meds: Albuterol, amiodarone, apixaban, atorvastatin, dapagliflozin, metoprolol, potassium chloride, spironolactone, suboxone, symbicort, torsemide  NKDA  Social hx: 100 pack years, quit in May. Hx of IVDU, quit in 2015, mild alcohol use.  Physical exam/relevant labs, diagnostic testing/imaging: Afebrile on arrival, has developed fever multiple days, most recently on the 14th. WBCs elevated, peaking at 20, down to 11 today. Imaging suggest discitis/osteomyelitis.   Problem based assessment and plan:  - Problem 1: Discitis  A. Assessment: Imaging suggests osteomyelitis of L3-L4, suspicion confirmed by multiple docs and ID team Pt empirically started on Zosyn and Vanc.  Recommended to have IR biopsy/aspiration which was completed on the 12th.  Zosyn 3.375 g q8 6/9 >> 6/10 Vanc 1250 mg q 48 h 6/9 >>>6/10 Started ceftriaxone 2g q24h 6/12>>6/14 Started daptomycin 600 mg q 48 hr, given once on 6/12 (stopped due to CK value elevation) Switched to ceftaroline (acute hematogenous osetomyelitis) 300 mg on 6/12, up to 400 mg on 6/15 (normal dose is 600 mg q12, CrCl 15-30 > 300 g 8, 30-50 > 400 q 8.)  B. Plan: Monitor response to ceftarolne, continue to monitor temperature and symptoms. Wait for cx reports if any come back and determine appropriate tx accordingly. Monitor renal function and adjust dose accordingly. Monitor CBC and for signs of neurotox  - Problem 2: AKI on CDK IIIb with urinary hesitation  A. Assessment: CrCl is 32.9 mL/min  B. Plan:  - Problem 3:  A. Assessment:  B. Plan:

## 2022-01-01 NOTE — Progress Notes (Signed)
    [facility]   Reason for visit: Follow up on discitis  Interval History: no growth from culture  Physical Exam: Constitutional:  Vitals:   01/01/22 0940 01/01/22 1250  BP: 112/82 (!) 101/54  Pulse:  99  Resp:  (!) 22  Temp:  98.1 F (36.7 C)  SpO2:  95%   patient appears in NAD  Impression: discitis  Plan: 1.  No changes, continue current antibiotics.

## 2022-01-01 NOTE — Progress Notes (Signed)
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  Neurosurgery was consulted who recommended IR consultation for drainage and culture of fluid.  Patient underwent biopsy 6/12, antibiotics started.  Hospital course has also been complicated by hypoxia and acute kidney injury secondary to urinary retention.  Foley catheter has been placed.   Assessment & Plan:   Principal Problem:   Discitis of lumbar region Active Problems:   Atrial fibrillation (HCC)   Alcoholism (HCC)   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HCC)   Myeloproliferative neoplasm (HCC)   COPD (chronic obstructive pulmonary disease) (HCC)   PAD (peripheral artery disease) (HCC)   Aortic atherosclerosis (HCC)   CAD S/P percutaneous coronary angioplasty   S/P mitral valve clip implantation  1-Lower back pain secondary to acute osteomyelitis/discitis L3-L4 -MRI showed evidence of osteomyelitis/discitis -Status post IR biopsy 6/12. -Now on Teflaro. IV -Ceftriaxone and daptomycin discontinued 6/14 due to concern of elevation in CK -WBC trending down, fever curve trending down.   2-AKI on CKD stage IIIb: Secondary to acute urinary retention, hypovolemia.  CR  baseline 2.2 Creatinine peaked to 4.2 After Foley catheter placed renal function has improved. Treated with IV fluids. Cr down to 2.0 NSL fluids to avoid pulmonary edema. Encourage oral intake.   3-Hyperkalemia: Received Lokelma x3 4-Acute hypoxemia: Continues to be on 2 3  L of oxygen. Probably undiagnosed sleep apnea.  Continue with CPAP as a chef Continue to hold diuretics due to AKI.   NSL fluids.   Twitching; chronic for more than 4 months.  Monitor.  ABG negative for hypercapnia.  Mild hypoxemia , placed on oxygen.   Congestive heart failure reduced ejection fraction 35% Compensated Continue with Toprol, hel 6/14 due to soft BP.  Holding Farxiga, Aldactone, torsemide due to AKI   CAD status post PCI to mid LAD 09/2020 - On Eliquis and statin.   Severe mitral regurgitation status post TAVR/MitraClip Severe TR/aortic insufficiency -S/P MitraClip placement 11/2021.  back on eliquis -Tricuspid and aortic valve under surveillance by cardiology per cardiology  Cultures are positive, then he will need TEE. Appreciate his recs.   Persistent atrial fibrillation/flutter status post ablation -On Toprol  History of COPD - Bronchodilators.  I-S/flutter valve.   Obstructive sleep apnea - Likely undiagnosed outpatient.  CPAP nightly    Generalized pain; uric acid check. Elevated at 12. Start Allopurinol. Will give one dose of colchicine.  Reduce gabapentin dose due to AKI>  Constipation; continue with laxative.    Estimated body mass index is 35.48 kg/m as calculated from the following:   Height as of this encounter: '5\' 6"'$  (1.676 m).   Weight as of this encounter: 99.7 kg.   DVT prophylaxis: Eliquis Code Status: Full code Family Communication: care discussed with patient Disposition Plan:  Status is: Inpatient Remains inpatient appropriate because: management of discitis    Consultants:  ID  Procedures:  IR lumbar aspiration   Antimicrobials:    Subjective: He is more alert today. He doesn't  remember when he had last BM.  He denies worsening dyspnea.  Objective: Vitals:   01/01/22 0646 01/01/22 0841 01/01/22 0940 01/01/22 1250  BP: 99/61  112/82 (!) 101/54  Pulse: 74   99  Resp: 20   (!) 22  Temp: 97.7 F (36.5 C)   98.1  F (36.7 C)  TempSrc:    Oral  SpO2: 97% 98%  95%  Weight: 99.7 kg     Height:        Intake/Output Summary (Last 24 hours) at 01/01/2022 1503 Last data filed at 01/01/2022 0954 Gross per 24 hour  Intake 1969.45 ml  Output 1400 ml  Net 569.45 ml    Filed Weights   12/27/21 0505 12/29/21 0500 01/01/22 0646  Weight: 93.3 kg 94.9 kg 99.7 kg    Examination:  General exam: NAD Respiratory system: CTA Cardiovascular system: S 1, S 2 RRR Gastrointestinal system: BS present, soft, nt Central nervous system: alert Extremities:trace edema.   Data Reviewed: I have personally reviewed following labs and imaging studies  CBC: Recent Labs  Lab 12/26/21 1512 12/27/21 0055 12/28/21 0550 12/29/21 0446 12/30/21 0439 12/31/21 0522 01/01/22 0510  WBC 13.4*   < > 13.7* 20.6* 17.1* 14.0* 11.6*  NEUTROABS 10.6*  --   --   --   --   --   --   HGB 16.0  16.7   < > 14.4 14.2 13.3 12.0* 11.7*  HCT 50.7  49.0   < > 45.6 44.7 42.8 39.2 37.8*  MCV 96.0   < > 96.6 97.2 99.1 100.0 99.0  PLT 276   < > 251 268 250 246 220   < > = values in this interval not displayed.    Basic Metabolic Panel: Recent Labs  Lab 12/28/21 0550 12/29/21 0446 12/30/21 0439 12/31/21 0522 01/01/22 0510  NA 139 142 140 143 143  K 4.1 4.7 5.9* 4.3 3.9  CL 101 103 103 112* 113*  CO2 '25 27 26 25 23  '$ GLUCOSE 127* 138* 138* 125* 123*  BUN 53* 67* 77* 86* 74*  CREATININE 2.80* 4.25* 3.87* 3.18* 2.18*  CALCIUM 8.1* 8.8* 8.5* 8.4* 8.0*  MG 2.4  --   --   --   --     GFR: Estimated Creatinine Clearance: 32.9 mL/min (A) (by C-G formula based on SCr of 2.18 mg/dL (H)). Liver Function Tests: Recent Labs  Lab 12/27/21 0055  AST 20  ALT 26  ALKPHOS 116  BILITOT 0.9  PROT 7.0  ALBUMIN 3.0*    No results for input(s): "LIPASE", "AMYLASE" in the last 168 hours. No results for input(s): "AMMONIA" in the last 168 hours. Coagulation Profile: Recent Labs  Lab 12/27/21 0055  INR 1.1    Cardiac  Enzymes: Recent Labs  Lab 12/30/21 0439 12/31/21 0522 01/01/22 0510  CKTOTAL 509* 1,018* 758*    BNP (last 3 results) No results for input(s): "PROBNP" in the last 8760 hours. HbA1C: No results for input(s): "HGBA1C" in the last 72 hours. CBG: No results for input(s): "GLUCAP" in the last 168 hours. Lipid Profile: No results for input(s): "CHOL", "HDL", "LDLCALC", "TRIG", "CHOLHDL", "LDLDIRECT" in the last 72 hours. Thyroid Function Tests: No results for input(s): "TSH", "T4TOTAL", "FREET4", "T3FREE", "THYROIDAB" in the last 72 hours. Anemia Panel: No results for input(s): "VITAMINB12", "FOLATE", "FERRITIN", "TIBC", "IRON", "RETICCTPCT" in the last 72 hours. Sepsis Labs: Recent Labs  Lab 12/26/21 2207 12/27/21 0055  PROCALCITON <0.10  --   LATICACIDVEN 1.5 1.6     Recent Results (from the past 240 hour(s))  MRSA  Next Gen by PCR, Nasal     Status: Abnormal   Collection Time: 12/26/21 10:26 PM   Specimen: Nasal Mucosa; Nasal Swab  Result Value Ref Range Status   MRSA by PCR Next Gen DETECTED (A) NOT DETECTED Final    Comment: RESULT CALLED TO, READ BACK BY AND VERIFIED WITH: Lear Ng, RN 12/27/2021 @ 47 [facility] (NOTE) The GeneXpert MRSA Assay (FDA approved for NASAL specimens only), is one component of a comprehensive MRSA colonization surveillance program. It is not intended to diagnose MRSA infection nor to guide or monitor treatment for MRSA infections. Test performance is not FDA approved in patients less than 59 years old. Performed at [facility], [place] [address], [place], [place] [address]   Culture, blood (Routine X 2) w Reflex to ID Panel     Status: None   Collection Time: 12/27/21 12:55 AM   Specimen: BLOOD  Result Value Ref Range Status   Specimen Description   Final    BLOOD BLOOD RIGHT HAND Performed at [facility] [address] [place], [place] [address]    Special Requests   Final    BOTTLES DRAWN AEROBIC  ONLY Blood Culture results may not be optimal due to an inadequate volume of blood received in culture bottles Performed at [facility] [address] [place], [place] [address]    Culture   Final    NO GROWTH 5 DAYS Performed at [facility], [place] [address], [place], [place] [address]    Report Status 01/01/2022 FINAL  Final  Culture, blood (Routine X 2) w Reflex to ID Panel     Status: None   Collection Time: 12/27/21 12:55 AM   Specimen: BLOOD  Result Value Ref Range Status   Specimen Description   Final    BLOOD RIGHT ANTECUBITAL Performed at [facility] [address], [place], [place] [address]    Special Requests   Final    BOTTLES DRAWN AEROBIC ONLY Blood Culture adequate volume Performed at [facility] [address] [place], [place] [address]    Culture   Final    NO GROWTH 5 DAYS Performed at [facility], [place] [address], [place], [place] [address]    Report Status 01/01/2022 FINAL  Final  Culture, blood (Routine X 2) w Reflex to ID Panel     Status: None (Preliminary result)   Collection Time: 12/29/21  8:04 AM   Specimen: BLOOD  Result Value Ref Range Status   Specimen Description   Final    BLOOD RIGHT ANTECUBITAL Performed at [facility] [address] [place], [place] [address]    Special Requests   Final    IN PEDIATRIC BOTTLE Blood Culture results may not be optimal due to an inadequate volume of blood received in culture bottles Performed at [facility] [address] [place], [place] [address]    Culture   Final    NO GROWTH 3 DAYS Performed at [facility], [place] [address], [place],  [address]    Report Status PENDING  Incomplete  Culture, blood (Routine X 2) w Reflex to ID Panel     Status: None (Preliminary result)   Collection Time: 12/29/21  8:10 AM   Specimen: BLOOD  Result Value Ref Range Status   Specimen  Description   Final    BLOOD BLOOD LEFT HAND Performed at [facility], [place] [address] [place], [place] [address]    Special Requests   Final    BOTTLES DRAWN AEROBIC ONLY Blood Culture results may not be optimal due to an inadequate volume of blood received in culture bottles Performed at [facility] [address], [place], [place] [address]    Culture   Final    NO GROWTH 3 DAYS Performed at [facility], [place] [address], [place], [place] [address]    Report Status PENDING  Incomplete  Urine Culture     Status: None   Collection Time: 12/29/21  8:44 AM   Specimen: Urine, Clean Catch  Result Value Ref Range Status   Specimen Description   Final    URINE, CLEAN CATCH Performed at [facility], [place] [address], [place], [place] [address]    Special Requests   Final    NONE Performed at [facility], [place] [address], [place], [place] [address]    Culture   Final    NO GROWTH Performed at [facility], [place] [address], [place], [place] [address]    Report Status 12/30/2021 FINAL  Final  Aerobic/Anaerobic Culture w Gram Stain (surgical/deep wound)     Status: None (Preliminary result)   Collection Time: 12/29/21  4:34 PM   Specimen: Wound; Abscess  Result Value Ref Range Status   Specimen Description   Final    WOUND Performed at [facility] [address], [place], [place] [address]    Special Requests   Final    ABSCESS Performed at [facility], [place] [address], [place], [place] [address]    Gram Stain   Final    MODERATE WBC PRESENT, PREDOMINANTLY PMN NO ORGANISMS SEEN    Culture   Final    NO GROWTH 3 DAYS NO ANAEROBES ISOLATED; CULTURE IN PROGRESS FOR 5 DAYS Performed at [facility] [address] [place], [place] [address]    Report Status PENDING  Incomplete         Radiology Studies: No results  found.      Scheduled Meds:  amiodarone  200 mg Oral Daily   apixaban  5 mg Oral BID   buprenorphine-naloxone  1 tablet Sublingual Daily   Chlorhexidine Gluconate Cloth  6 each Topical Q0600   docusate sodium  100 mg Oral BID   fluticasone furoate-vilanterol  1 puff Inhalation Daily   gabapentin  100 mg Oral TID   mouth rinse  15 mL Mouth Rinse BID   metoprolol succinate  12.5 mg Oral Daily   mupirocin ointment  1 application  Nasal BID   senna  1 tablet Oral Daily   sodium chloride flush  3 mL Intravenous Q12H   Continuous Infusions:  ceFTAROline (TEFLARO) IV       LOS: 6 days    Time spent: 35 minutes    Neeka Urista A Tanda Morrissey, MD [facility]   If 7PM-7AM, please contact night-coverage [email or web address]  01/01/2022, 3:03 PM

## 2022-01-02 DIAGNOSIS — M4646 Discitis, unspecified, lumbar region: Secondary | ICD-10-CM | POA: Diagnosis not present

## 2022-01-02 LAB — BASIC METABOLIC PANEL
Anion gap: 8 (ref 5–15)
BUN: 62 mg/dL — ABNORMAL HIGH (ref 8–23)
CO2: 24 mmol/L (ref 22–32)
Calcium: 8.4 mg/dL — ABNORMAL LOW (ref 8.9–10.3)
Chloride: 112 mmol/L — ABNORMAL HIGH (ref 98–111)
Creatinine, Ser: 1.66 mg/dL — ABNORMAL HIGH (ref 0.61–1.24)
GFR, Estimated: 43 mL/min — ABNORMAL LOW (ref >60)
Glucose, Bld: 121 mg/dL — ABNORMAL HIGH (ref 70–99)
Potassium: 4 mmol/L (ref 3.5–5.1)
Sodium: 144 mmol/L (ref 135–145)

## 2022-01-02 LAB — CBC
HCT: 43.4 % (ref 39.0–52.0)
Hemoglobin: 13.4 g/dL (ref 13.0–17.0)
MCH: 30.7 pg (ref 26.0–34.0)
MCHC: 30.9 g/dL (ref 30.0–36.0)
MCV: 99.5 fL (ref 80.0–100.0)
Platelets: 273 10*3/uL (ref 150–400)
RBC: 4.36 MIL/uL (ref 4.22–5.81)
RDW: 18.3 % — ABNORMAL HIGH (ref 11.5–15.5)
WBC: 12.6 10*3/uL — ABNORMAL HIGH (ref 4.0–10.5)
nRBC: 0 % (ref 0.0–0.2)

## 2022-01-02 MED ORDER — CHLORHEXIDINE GLUCONATE CLOTH 2 % EX PADS
6.0000 | MEDICATED_PAD | Freq: Every day | CUTANEOUS | Status: DC
  Administered 2022-01-03 – 2022-01-05 (×3): 6 via TOPICAL

## 2022-01-02 MED ORDER — GABAPENTIN 100 MG PO CAPS
200.0000 mg | ORAL_CAPSULE | Freq: Three times a day (TID) | ORAL | Status: DC
  Administered 2022-01-02 – 2022-01-05 (×10): 200 mg via ORAL
  Filled 2022-01-02 (×10): qty 2

## 2022-01-02 MED ORDER — COLCHICINE 0.3 MG HALF TABLET
0.3000 mg | ORAL_TABLET | Freq: Once | ORAL | Status: AC
Start: 2022-01-02 — End: 2022-01-02
  Administered 2022-01-02: 0.3 mg via ORAL
  Filled 2022-01-02: qty 1

## 2022-01-02 NOTE — Telephone Encounter (Signed)
Called to let the patient know I changed his provider from Dr. Tamala Julian to Dr. Ali Lowe and his next appointment in cardiology will be 04/01/22.  I was told by person that answered the phone that the patient is currently admitted to [facility].  Per chart looks like plan is for dc to SNF.

## 2022-01-02 NOTE — Progress Notes (Signed)
Occupational Therapy Treatment Patient Details Name: [name] MRN: [id number] DOB: [birth year] Today's Date: 01/02/2022   History of present illness Pt is a 76yo male presenting to [facility] ED on 12/26/21 with back pain and decreased ability to ambulate. MRI reveleaed R>L L3-L4 discitis  6/12 lumbar disc aspiration L3-4  PMH: afib, CHF, COPD, HTN, GERD, gout, substance abuse, syncope, CAD/NSTEMI with multiple cardiac surgeries, MDS, CKDIII, polycythemia.   OT comments  Patient continues to have pain but able to tolerate transfer to [facility] and toileting task. Mod x 2 to pivot to [facility] but total assist to rise from [facility] for pericare using stedy and to transfer to recliner.    Recommendations for follow up therapy are one component of a multi-disciplinary discharge planning process, led by the attending physician.  Recommendations may be updated based on patient status, additional functional criteria and insurance authorization.    Follow Up Recommendations  Skilled nursing-short term rehab (<3 hours/day)    Assistance Recommended at Discharge Frequent or constant Supervision/Assistance  Patient can return home with the following  Two people to help with walking and/or transfers;A lot of help with bathing/dressing/bathroom;Assistance with cooking/housework;Assistance with feeding;Help with stairs or ramp for entrance;Assist for transportation   Equipment Recommendations  Other (comment) (TBD)    Recommendations for Other Services      Precautions / Restrictions Precautions Precautions: Fall Precaution Comments: painful joints - yelps but still willing to move Restrictions Weight Bearing Restrictions: No       Mobility Bed Mobility                    Transfers                         Balance Overall balance assessment: Needs assistance Sitting-balance support: No upper extremity supported, Feet supported Sitting balance-Leahy Scale: Fair       Standing  balance-Leahy Scale: Poor                             ADL either performed or assessed with clinical judgement   ADL Overall ADL's : Needs assistance/impaired     Grooming: Wash/dry face;Set up Grooming Details (indicate cue type and reason): able to wash face with right hand                 Toilet Transfer: Total assistance;+2 for physical assistance Toilet Transfer Details (indicate cue type and reason): Mod x 2 t to pivot to [facility], but total assist to rise from [facility] and transfer to recliner. Toileting- Clothing Manipulation and Hygiene: Total assistance;Sit to/from stand Toileting - Clothing Manipulation Details (indicate cue type and reason): total assist for toileting     Functional mobility during ADLs: Moderate assistance;+2 for physical assistance      Extremity/Trunk Assessment              Vision Patient Visual Report: No change from baseline     Perception     Praxis      Cognition Arousal/Alertness: Awake/alert Behavior During Therapy: WFL for tasks assessed/performed Overall Cognitive Status: Within Functional Limits for tasks assessed                                          Exercises      Shoulder Instructions  General Comments Pt on RA throughout, VSS.    Pertinent Vitals/ Pain       Pain Assessment Pain Assessment: 0-10 Pain Score: 10-Worst pain ever Faces Pain Scale: Hurts worst Pain Location: Back, all extremity joints Pain Descriptors / Indicators: Crying, Discomfort, Grimacing, Moaning, Shooting Pain Intervention(s): Limited activity within patient's tolerance, Premedicated before session  Home Living                                          Prior Functioning/Environment              Frequency  Min 2X/week        Progress Toward Goals  OT Goals(current goals can now be found in the care plan section)  Progress towards OT goals: Progressing toward goals  Acute  Rehab OT Goals Patient Stated Goal: walk to bathroom OT Goal Formulation: With patient Time For Goal Achievement: 01/15/22 Potential to Achieve Goals: Good  Plan Discharge plan remains appropriate    Co-evaluation    PT/OT/SLP Co-Evaluation/Treatment: Yes Reason for Co-Treatment: For patient/therapist safety PT goals addressed during session: Mobility/safety with mobility OT goals addressed during session: ADL's and self-care      AM-PAC OT "6 Clicks" Daily Activity     Outcome Measure   Help from another person eating meals?: A Little Help from another person taking care of personal grooming?: A Little Help from another person toileting, which includes using toliet, bedpan, or urinal?: Total Help from another person bathing (including washing, rinsing, drying)?: A Lot Help from another person to put on and taking off regular upper body clothing?: A Lot   6 Click Score: 11    End of Session Equipment Utilized During Treatment: Gait belt;Rolling walker (2 wheels) (stedy)  OT Visit Diagnosis: Pain   Activity Tolerance Patient tolerated treatment well;Patient limited by pain   Patient Left in chair;with call bell/phone within reach;with chair alarm set   Nurse Communication Mobility status        Time: 8032-1224 OT Time Calculation (min): 30 min  Charges: OT General Charges $OT Visit: 1 Visit OT Treatments $Self Care/Home Management : 8-22 mins  Derl Barrow, OTR/L [facility]  Office [phone] Pager: [phone]   Lenward Chancellor 01/02/2022, 2:07 PM

## 2022-01-02 NOTE — NC FL2 (Signed)
[place] LEVEL OF CARE SCREENING TOOL     IDENTIFICATION  Patient Name: [name] Birthdate: October 25, 1945 Sex: male Admission Date (Current Location): 12/26/2021  [facility] and [place] Number:  Herbalist and Address:  [facility],  [place], [place]      Provider Number: [id number]  Attending Physician Name and Address:  Elmarie Shiley, MD  Relative Name and Phone Number:  [name] Miller(s/o) [phone]    Current Level of Care: Hospital Recommended Level of Care: [place] Prior Approval Number:    Date Approved/Denied:   PASRR Number: [id number]  Discharge Plan: SNF    Current Diagnoses: Patient Active Problem List   Diagnosis Date Noted   Discitis of lumbar region 12/26/2021   Cervical radiculopathy 12/24/2021   Acute combined systolic and diastolic heart failure ([place]) 11/21/2021   CAD S/P percutaneous coronary angioplasty 11/20/2021   S/P mitral valve clip implantation 11/20/2021   Severe mitral regurgitation    Acute on chronic heart failure ([place]) 08/22/2021   Opioid use disorder 08/22/2021   Cellulitis 04/26/2021   Venous stasis dermatitis of both lower extremities 04/26/2021   PAD (peripheral artery disease) ([place]) 04/26/2021   Aortic atherosclerosis ([place]) 04/26/2021   HLD (hyperlipidemia) 11/27/2020   Prediabetes 10/13/2020   Non-ST elevation (NSTEMI) myocardial infarction [facility])    Hx of adenomatous colonic polyps    Benign neoplasm of cecum    Benign neoplasm of ascending colon    Benign neoplasm of transverse colon    Benign neoplasm of descending colon    Benign neoplasm of sigmoid colon    Long term current use of amiodarone 01/24/2018   GI bleed 02/15/2017   Pancytopenia, acquired ([place]) 11/26/2016   Tubular adenoma 11/25/2016   Gout 11/02/2016   Hepatitis C antibody test positive    Acute kidney injury ([place]) 11/01/2014   Leukopenia due to antineoplastic  chemotherapy ([place]) 08/24/2014   Thrombocytopenia due to drugs 07/25/2014   Encounter for screening colonoscopy 07/02/2014   Chronic anticoagulation 07/02/2014   Acute pulmonary edema (HCC) 06/05/2014   Prolonged QT interval 06/05/2014   Sinus bradycardia 06/05/2014   Opioid dependence ([place]) 53/66/4403   Diastolic dysfunction 47/42/5956   COPD (chronic obstructive pulmonary disease) ([place]) 05/30/2014   Health care maintenance 05/24/2014   Acute respiratory failure with hypoxia and hypercapnia (HCC) 04/29/2014   Anemia due to antineoplastic chemotherapy 04/24/2014   Thyroid mass 08/26/2013   Myeloproliferative neoplasm ([place]) 08/15/2013   Polycythemia vera ([place]) 01/03/2013   Tobacco abuse 09/15/2012   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[place])    Alcoholism ([place]) 09/30/2010   Hypertension 09/30/2010   Atrial fibrillation (HCC)     Orientation RESPIRATION BLADDER Height & Weight     Self, Time, Situation, Place  O2 Continent Weight: 102.7 kg Height:  '5\' 6"'$  (167.6 cm)  BEHAVIORAL SYMPTOMS/MOOD NEUROLOGICAL BOWEL NUTRITION STATUS      Continent Diet (Heart Healthy)  AMBULATORY STATUS COMMUNICATION OF NEEDS Skin   Limited Assist Verbally Normal                       Personal Care Assistance Level of Assistance  Bathing, Feeding, Dressing   Feeding assistance: Limited assistance Dressing Assistance: Limited assistance     Functional Limitations Info  Sight, Hearing, Speech Sight Info: Adequate Hearing Info: Adequate Speech Info: Impaired (Dentures-top)    SPECIAL CARE FACTORS FREQUENCY  PT (By licensed PT), OT (  By licensed OT)     PT Frequency:  (5x week) OT Frequency:  (5x week)            Contractures Contractures Info: Not present    Additional Factors Info  Code Status, Allergies, Psychotropic Code Status Info:  (Full) Allergies Info:  (NKA) Psychotropic Info:  (suboxone see MAR)         Current Medications (01/02/2022):  This is  the current hospital active medication list Current Facility-Administered Medications  Medication Dose Route Frequency Provider Last Rate Last Admin   acetaminophen (TYLENOL) tablet 650 mg  650 mg Oral Q6H PRN Clance Boll, MD   650 mg at 12/31/21 6767   Or   acetaminophen (TYLENOL) suppository 650 mg  650 mg Rectal Q6H PRN Clance Boll, MD       allopurinol (ZYLOPRIM) tablet 150 mg  150 mg Oral Daily [name], Belkys A, MD   150 mg at 01/02/22 1029   amiodarone (PACERONE) tablet 200 mg  200 mg Oral Daily Amin, Ankit Chirag, MD   200 mg at 01/02/22 1030   apixaban (ELIQUIS) tablet 5 mg  5 mg Oral BID Amin, Ankit Chirag, MD   5 mg at 01/02/22 1030   buprenorphine-naloxone (SUBOXONE) 8-2 mg per SL tablet 1 tablet  1 tablet Sublingual Daily Myles Rosenthal A, MD   1 tablet at 01/02/22 1029   ceftaroline (TEFLARO) 400 mg in sodium chloride 0.9 % 100 mL IVPB  400 mg Intravenous Q12H Thayer Headings, MD 100 mL/hr at 01/02/22 1037 400 mg at 01/02/22 1037   docusate sodium (COLACE) capsule 100 mg  100 mg Oral BID Amin, Ankit Chirag, MD   100 mg at 01/02/22 1030   fentaNYL (SUBLIMAZE) injection 12.5 mcg  12.5 mcg Intravenous Q4H PRN [name], Belkys A, MD   12.5 mcg at 01/02/22 0349   fluticasone furoate-vilanterol (BREO ELLIPTA) 200-25 MCG/ACT 1 puff  1 puff Inhalation Daily Myles Rosenthal A, MD   1 puff at 01/02/22 0900   gabapentin (NEURONTIN) capsule 200 mg  200 mg Oral TID [name], Belkys A, MD   200 mg at 01/02/22 1031   guaiFENesin (ROBITUSSIN) 100 MG/5ML liquid 5 mL  5 mL Oral Q4H PRN Amin, Ankit Chirag, MD       hydrALAZINE (APRESOLINE) injection 10 mg  10 mg Intravenous Q4H PRN Amin, Ankit Chirag, MD       HYDROcodone-acetaminophen (NORCO/VICODIN) 5-325 MG per tablet 1-2 tablet  1-2 tablet Oral Q4H PRN Clance Boll, MD   2 tablet at 01/02/22 1031   ipratropium-albuterol (DUONEB) 0.5-2.5 (3) MG/3ML nebulizer solution 3 mL  3 mL Nebulization Q4H PRN Amin, Ankit Chirag, MD    3 mL at 12/30/21 0339   lidocaine (PF) (XYLOCAINE) 1 % injection    PRN Criselda Peaches, MD   15 mL at 12/29/21 1557   MEDLINE mouth rinse  15 mL Mouth Rinse BID Amin, Ankit Chirag, MD   15 mL at 01/02/22 1032   metoprolol succinate (TOPROL-XL) 24 hr tablet 12.5 mg  12.5 mg Oral Daily Myles Rosenthal A, MD   12.5 mg at 01/02/22 1030   metoprolol tartrate (LOPRESSOR) injection 5 mg  5 mg Intravenous Q4H PRN Amin, Ankit Chirag, MD       mupirocin ointment (BACTROBAN) 2 % 1 application   1 application  Nasal BID Damita Lack, MD   1 application  at 20/94/70 1033   senna (SENOKOT) tablet 8.6 mg  1 tablet Oral Daily  [name], Belkys A, MD   8.6 mg at 01/02/22 1030   senna-docusate (Senokot-S) tablet 1 tablet  1 tablet Oral QHS PRN Myles Rosenthal A, MD       sodium chloride flush (NS) 0.9 % injection 3 mL  3 mL Intravenous Q12H Clance Boll, MD   3 mL at 01/01/22 2300     Discharge Medications: Please see discharge summary for a list of discharge medications.  Relevant Imaging Results:  Relevant Lab Results:   Additional Information  (Teflaro(Ceftaroline) '400mg'$  iv bid x 6weeks to end 02/05/22.PICC line)  Dessa Phi, RN

## 2022-01-02 NOTE — Plan of Care (Signed)
  Problem: Education: Goal: Knowledge of General Education information will improve Description Including pain rating scale, medication(s)/side effects and non-pharmacologic comfort measures Outcome: Progressing   Problem: Health Behavior/Discharge Planning: Goal: Ability to manage health-related needs will improve Outcome: Progressing   

## 2022-01-02 NOTE — Progress Notes (Signed)
[facility]   Reason for visit: Follow up on discitis  Interval History: now with neck pain, no growth on culture to date. Fever trending down.  WBC 12.6 from a peak of 20.6.   Day 8 total antibiotics  Physical Exam: Constitutional:  Vitals:   01/01/22 1945 01/02/22 0521  BP: 95/61 132/78  Pulse: 96 73  Resp: 18 20  Temp: 98.6 F (37 C) 97.7 F (36.5 C)  SpO2:  93%   patient appears in NAD Respiratory: Normal respiratory effort  Review of Systems: Constitutional: negative for fevers and chills  Lab Results  Component Value Date   WBC 12.6 (H) 01/02/2022   HGB 13.4 01/02/2022   HCT 43.4 01/02/2022   MCV 99.5 01/02/2022   PLT 273 01/02/2022    Lab Results  Component Value Date   CREATININE 1.66 (H) 01/02/2022   BUN 62 (H) 01/02/2022   NA 144 01/02/2022   K 4.0 01/02/2022   CL 112 (H) 01/02/2022   CO2 24 01/02/2022    Lab Results  Component Value Date   ALT 26 12/27/2021   AST 20 12/27/2021   ALKPHOS 116 12/27/2021     Microbiology: Recent Results (from the past 240 hour(s))  MRSA Next Gen by PCR, Nasal     Status: Abnormal   Collection Time: 12/26/21 10:26 PM   Specimen: Nasal Mucosa; Nasal Swab  Result Value Ref Range Status   MRSA by PCR Next Gen DETECTED (A) NOT DETECTED Final    Comment: RESULT CALLED TO, READ BACK BY AND VERIFIED WITH: Florinda Marker, RN 12/27/2021 @ 0025 [facility] (NOTE) The GeneXpert MRSA Assay (FDA approved for NASAL specimens only), is one component of a comprehensive MRSA colonization surveillance program. It is not intended to diagnose MRSA infection nor to guide or monitor treatment for MRSA infections. Test performance is not FDA approved in patients less than 30 years old. Performed at [facility], [address], [place], [place] [address]   Culture, blood (Routine X 2) w Reflex to ID Panel     Status: None   Collection Time: 12/27/21 12:55 AM   Specimen: BLOOD  Result Value Ref  Range Status   Specimen Description   Final    BLOOD BLOOD RIGHT HAND Performed at [facility], [address], [place], [place] [address]    Special Requests   Final    BOTTLES DRAWN AEROBIC ONLY Blood Culture results may not be optimal due to an inadequate volume of blood received in culture bottles Performed at [facility], [address] [place], [place] [address]    Culture   Final    NO GROWTH 5 DAYS Performed at [facility], [address] [place], [place] [address]    Report Status 01/01/2022 FINAL  Final  Culture, blood (Routine X 2) w Reflex to ID Panel     Status: None   Collection Time: 12/27/21 12:55 AM   Specimen: BLOOD  Result Value Ref Range Status   Specimen Description   Final    BLOOD RIGHT ANTECUBITAL Performed at [facility], [address] [place], [place] [address]    Special Requests   Final    BOTTLES DRAWN AEROBIC ONLY Blood Culture adequate volume Performed at [facility], [address] [place], [place] [address]    Culture   Final    NO GROWTH 5 DAYS Performed at [facility], [address], [place], [place] [address]    Report Status 01/01/2022 FINAL  Final  Culture, blood (Routine X 2) w Reflex to ID Panel     Status: None (Preliminary result)   Collection Time: 12/29/21  8:04 AM   Specimen: BLOOD  Result Value Ref Range Status   Specimen Description   Final    BLOOD RIGHT ANTECUBITAL Performed at [place] [address] [place], [place] [address]    Special Requests   Final    IN PEDIATRIC BOTTLE Blood Culture results may not be optimal due to an inadequate volume of blood received in culture bottles Performed at [place] [address], [place], [place] [address]    Culture   Final    NO GROWTH 4 DAYS Performed at [facility], [place] [address] [place], [place] [address]    Report Status  PENDING  Incomplete  Culture, blood (Routine X 2) w Reflex to ID Panel     Status: None (Preliminary result)   Collection Time: 12/29/21  8:10 AM   Specimen: BLOOD  Result Value Ref Range Status   Specimen Description   Final    BLOOD BLOOD LEFT HAND Performed at [place] [address], [place], [place] [address]    Special Requests   Final    BOTTLES DRAWN AEROBIC ONLY Blood Culture results may not be optimal due to an inadequate volume of blood received in culture bottles Performed at [place] [address], [place], [place] [address]    Culture   Final    NO GROWTH 4 DAYS Performed at [facility], [place] [address], [place], [place] [address]    Report Status PENDING  Incomplete  Urine Culture     Status: None   Collection Time: 12/29/21  8:44 AM   Specimen: Urine, Clean Catch  Result Value Ref Range Status   Specimen Description   Final    URINE, CLEAN CATCH Performed at [facility], [place] [address], [place], [place] [address]    Special Requests   Final    NONE Performed at [facility], [place] [address] [place], [place] [address]    Culture   Final    NO GROWTH Performed at [facility], [place] [address], [place], [place] [address]    Report Status 12/30/2021 FINAL  Final  Aerobic/Anaerobic Culture w Gram Stain (surgical/deep wound)     Status: None (Preliminary result)   Collection Time: 12/29/21  4:34 PM   Specimen: Wound; Abscess  Result Value Ref Range Status   Specimen Description   Final    WOUND Performed at [place] [address] [place], [place] [address]    Special Requests   Final    ABSCESS Performed at [facility], [place] [address] [place], [place] [address]    Gram Stain   Final    MODERATE WBC PRESENT, PREDOMINANTLY PMN NO ORGANISMS SEEN    Culture   Final    NO GROWTH 4 DAYS NO ANAEROBES ISOLATED; CULTURE  IN PROGRESS FOR 5 DAYS Performed at [place] [address] [place],  [address]    Report Status PENDING  Incomplete    Impression/Plan:  1. Discitis - no positive culture to date and now on ceftaroline for treatment and tolerating well.  With a negative culture, it is diffficult to determine  what is causing his infection so will plan to continue with this.    2.  Access - picc line ordered.  Blood cultures have remained negative.   3.  Disposition - will be going to a SNF  Diagnosis: discitis  Culture Result: negative  No Known Allergies  OPAT Orders Discharge antibiotics to be given via PICC line Discharge antibiotics: ceftaroline 400 mg IV twice a day Per pharmacy protocol yes Duration: 6 weeks End Date: February 05, 2022  [place] Per Protocol: yes  Home health RN for IV administration and teaching; PICC line care and labs.    Labs weekly while on IV antibiotics: _x_ CBC with differential __ BMP _x_ CMP _x_ CRP _x_ ESR __ Vancomycin trough __ CK  _x_ Please pull PIC at completion of IV antibiotics __ Please leave PIC in place until doctor has seen patient or been notified  Fax weekly labs to [phone]  Clinic Follow Up Appt: 01/14/22  @ 2:45 pm

## 2022-01-02 NOTE — Care Management Important Message (Signed)
Important Message  Patient Details IM Letter placed in Patients room. Name: [name] MRN: [id number] Date of Birth: Sep 08, 1945   Medicare Important Message Given:  Yes     Kerin Salen 01/02/2022, 1:04 PM

## 2022-01-02 NOTE — Progress Notes (Addendum)
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  Neurosurgery was consulted who recommended IR consultation for drainage and culture of fluid.  Patient underwent biopsy 6/12, antibiotics started.  Hospital course has also been complicated by hypoxia and acute kidney injury secondary to urinary retention.  Foley catheter has been placed.   Assessment & Plan:   Principal Problem:   Discitis of lumbar region Active Problems:   Atrial fibrillation (HCC)   Alcoholism (HCC)   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HCC)   Myeloproliferative neoplasm (HCC)   COPD (chronic obstructive pulmonary disease) (HCC)   PAD (peripheral artery disease) (HCC)   Aortic atherosclerosis (HCC)   CAD S/P percutaneous coronary angioplasty   S/P mitral valve clip implantation  1-Lower back pain secondary to acute osteomyelitis/discitis L3-L4 -MRI showed evidence of osteomyelitis/discitis -Status post IR biopsy 6/12. -Now on Teflaro. IV BID -Ceftriaxone and daptomycin discontinued 6/14 due to concern of elevation in CK -WBC trending down, fever curve trending down.  -IR consulted for central line tunneled for IV antibiotics. He will need total 6 weeks of IV antibiotics.   2-AKI on CKD stage IIIb: Secondary to acute urinary retention, hypovolemia.  CR  baseline 2.2 Creatinine peaked to 4.2 After Foley catheter placed renal function has improved. Treated with IV fluids. Cr down to 2.0----1.6 NSL fluids to avoid  pulmonary edema. Encourage oral intake.   3-Hyperkalemia: Received Lokelma x 3.  4-Acute hypoxemia: Continues to be on 2 -3 L of oxygen. Probably undiagnosed sleep apnea.  Continue with CPAP.  Continue to hold diuretics due to AKI.  Plan to resume diuretics tomorrow if renal function remain stable.  NSL fluids.   Twitching; Chronic for more than 4 months.  Monitor.  ABG negative for hypercapnia.  Mild hypoxemia , placed on oxygen.   Congestive heart failure reduced ejection fraction 35% Compensated Continue with Toprol, hel 6/14 due to soft BP.  Holding Farxiga, Aldactone, torsemide due to AKI   CAD status post PCI to mid LAD 09/2020 - On Eliquis and statin.   Severe mitral regurgitation status post TAVR/MitraClip Severe TR/aortic insufficiency -S/P MitraClip placement 11/2021.  back on eliquis -Tricuspid and aortic valve under surveillance by cardiology per cardiology if  Cultures are positive, then he will need TEE. Appreciate his recs.  Blood culture have remain negative   Persistent atrial fibrillation/flutter status post ablation -On Toprol.  History of COPD - Bronchodilators.  I-S/flutter valve.   Obstructive sleep apnea - Likely undiagnosed outpatient.  CPAP nightly    Generalized pain; uric acid check. Elevated at 12. Started  Allopurinol. Will repeat another dose of colchicine.  Reduce gabapentin dose due to AKI>  Constipation; continue with laxative. Had BM    Estimated body mass index is 36.54 kg/m as calculated from the following:   Height as of this encounter: '5\' 6"'$  (1.676 m).   Weight as of this encounter: 102.7 kg.   DVT prophylaxis: Eliquis Code Status: Full code Family Communication: care discussed with patient Disposition Plan:  Status is: Inpatient Remains inpatient appropriate because: management of  discitis    Consultants:  ID  Procedures:  IR lumbar aspiration   Antimicrobials:    Subjective: He is still having back pain. He  denies dyspnea. He seems more alert. Fever curve down.  Having BM.   Objective: Vitals:   01/01/22 1746 01/01/22 1945 01/02/22 0500 01/02/22 0521  BP:  95/61  132/78  Pulse:  96  73  Resp:  18  20  Temp:  98.6 F (37 C)  97.7 F (36.5 C)  TempSrc:  Oral    SpO2: 94%   93%  Weight:   102.7 kg   Height:        Intake/Output Summary (Last 24 hours) at 01/02/2022 1248 Last data filed at 01/02/2022 1037 Gross per 24 hour  Intake 453.8 ml  Output 550 ml  Net -96.2 ml    Filed Weights   12/29/21 0500 01/01/22 0646 01/02/22 0500  Weight: 94.9 kg 99.7 kg 102.7 kg    Examination:  General exam: NAD Respiratory system: CTA Cardiovascular system: S 1, S 2 RRR Gastrointestinal system:BS present, soft, nt Central nervous system: Alert Extremities:Trace edema   Data Reviewed: I have personally reviewed following labs and imaging studies  CBC: Recent Labs  Lab 12/26/21 1512 12/27/21 0055 12/29/21 0446 12/30/21 0439 12/31/21 0522 01/01/22 0510 01/02/22 0456  WBC 13.4*   < > 20.6* 17.1* 14.0* 11.6* 12.6*  NEUTROABS 10.6*  --   --   --   --   --   --   HGB 16.0  16.7   < > 14.2 13.3 12.0* 11.7* 13.4  HCT 50.7  49.0   < > 44.7 42.8 39.2 37.8* 43.4  MCV 96.0   < > 97.2 99.1 100.0 99.0 99.5  PLT 276   < > 268 250 246 220 273   < > = values in this interval not displayed.    Basic Metabolic Panel: Recent Labs  Lab 12/28/21 0550 12/29/21 0446 12/30/21 0439 12/31/21 0522 01/01/22 0510 01/02/22 0456  NA 139 142 140 143 143 144  K 4.1 4.7 5.9* 4.3 3.9 4.0  CL 101 103 103 112* 113* 112*  CO2 '25 27 26 25 23 24  '$ GLUCOSE 127* 138* 138* 125* 123* 121*  BUN 53* 67* 77* 86* 74* 62*  CREATININE 2.80* 4.25* 3.87* 3.18* 2.18* 1.66*  CALCIUM 8.1* 8.8* 8.5* 8.4* 8.0* 8.4*  MG 2.4  --   --   --   --   --     GFR: Estimated Creatinine Clearance: 43.8 mL/min (A) (by C-G formula based on SCr of 1.66 mg/dL (H)). Liver Function Tests: Recent Labs  Lab 12/27/21 0055  AST 20   ALT 26  ALKPHOS 116  BILITOT 0.9  PROT 7.0  ALBUMIN 3.0*    No results for input(s): "LIPASE", "AMYLASE" in the last 168 hours. No results for input(s): "AMMONIA" in the last 168 hours. Coagulation Profile: Recent Labs  Lab 12/27/21 0055  INR 1.1    Cardiac Enzymes: Recent Labs  Lab 12/30/21 0439 12/31/21 0522 01/01/22 0510  CKTOTAL 509* 1,018* 758*    BNP (last 3 results) No results for input(s): "PROBNP" in the last 8760 hours. HbA1C: No results for input(s): "HGBA1C" in the last 72 hours. CBG: No results for input(s): "GLUCAP" in the last 168 hours. Lipid Profile: No results for input(s): "CHOL", "HDL", "LDLCALC", "TRIG", "CHOLHDL", "LDLDIRECT" in the last 72 hours. Thyroid Function Tests: No results for input(s): "TSH", "T4TOTAL", "FREET4", "T3FREE", "THYROIDAB" in the last 72  hours. Anemia Panel: No results for input(s): "VITAMINB12", "FOLATE", "FERRITIN", "TIBC", "IRON", "RETICCTPCT" in the last 72 hours. Sepsis Labs: Recent Labs  Lab 12/26/21 2207 12/27/21 0055  PROCALCITON <0.10  --   LATICACIDVEN 1.5 1.6     Recent Results (from the past 240 hour(s))  MRSA Next Gen by PCR, Nasal     Status: Abnormal   Collection Time: 12/26/21 10:26 PM   Specimen: Nasal Mucosa; Nasal Swab  Result Value Ref Range Status   MRSA by PCR Next Gen DETECTED (A) NOT DETECTED Final    Comment: RESULT CALLED TO, READ BACK BY AND VERIFIED WITH: Lear Ng, RN 12/27/2021 @ 38 [facility] (NOTE) The GeneXpert MRSA Assay (FDA approved for NASAL specimens only), is one component of a comprehensive MRSA colonization surveillance program. It is not intended to diagnose MRSA infection nor to guide or monitor treatment for MRSA infections. Test performance is not FDA approved in patients less than 52 years old. Performed at [facility], [place] [address] [place], [place] [address]   Culture, blood (Routine X 2) w Reflex to ID Panel     Status: None   Collection Time:  12/27/21 12:55 AM   Specimen: BLOOD  Result Value Ref Range Status   Specimen Description   Final    BLOOD BLOOD RIGHT HAND Performed at [facility] [address] [place], [place] [address]    Special Requests   Final    BOTTLES DRAWN AEROBIC ONLY Blood Culture results may not be optimal due to an inadequate volume of blood received in culture bottles Performed at [place] [address] [place], [place] [address]    Culture   Final    NO GROWTH 5 DAYS Performed at [facility], [place] [address] [place], [place] [address]    Report Status 01/01/2022 FINAL  Final  Culture, blood (Routine X 2) w Reflex to ID Panel     Status: None   Collection Time: 12/27/21 12:55 AM   Specimen: BLOOD  Result Value Ref Range Status   Specimen Description   Final    BLOOD RIGHT ANTECUBITAL Performed at [facility] [address] [place], [place] [address]    Special Requests   Final    BOTTLES DRAWN AEROBIC ONLY Blood Culture adequate volume Performed at [facility] [address], [place], [place] [address]    Culture   Final    NO GROWTH 5 DAYS Performed at [facility], [place] [address], [place], [place] [address]    Report Status 01/01/2022 FINAL  Final  Culture, blood (Routine X 2) w Reflex to ID Panel     Status: None (Preliminary result)   Collection Time: 12/29/21  8:04 AM   Specimen: BLOOD  Result Value Ref Range Status   Specimen Description   Final    BLOOD RIGHT ANTECUBITAL Performed at [place] [address], [place], [place] [address]    Special Requests   Final    IN PEDIATRIC BOTTLE Blood Culture results may not be optimal due to an inadequate volume of blood received in culture bottles Performed at [place] [address] [place], [place] [address]    Culture   Final    NO GROWTH 4 DAYS Performed at [facility],  [place] [address], [place],  [address]    Report Status PENDING  Incomplete  Culture, blood (Routine X  2) w Reflex to ID Panel     Status: None (Preliminary result)   Collection Time: 12/29/21  8:10 AM   Specimen: BLOOD  Result Value Ref Range Status   Specimen Description   Final    BLOOD BLOOD LEFT HAND Performed at [place] [address], [place], [place] [address]    Special Requests   Final    BOTTLES DRAWN AEROBIC ONLY Blood Culture results may not be optimal due to an inadequate volume of blood received in culture bottles Performed at [place] [address] [place], [place] [address]    Culture   Final    NO GROWTH 4 DAYS Performed at [facility], [place] [address], [place], [place] [address]    Report Status PENDING  Incomplete  Urine Culture     Status: None   Collection Time: 12/29/21  8:44 AM   Specimen: Urine, Clean Catch  Result Value Ref Range Status   Specimen Description   Final    URINE, CLEAN CATCH Performed at [facility], [place] [address] [place], [place] [address]    Special Requests   Final    NONE Performed at [facility], [place], [place] [address], [place], [place] [address]    Culture   Final    NO GROWTH Performed at [facility], [place] [address], [place], [place] [address]    Report Status 12/30/2021 FINAL  Final  Aerobic/Anaerobic Culture w Gram Stain (surgical/deep wound)     Status: None (Preliminary result)   Collection Time: 12/29/21  4:34 PM   Specimen: Wound; Abscess  Result Value Ref Range Status   Specimen Description   Final    WOUND Performed at [facility] [address], [place], [place] [address]    Special Requests   Final    ABSCESS Performed at [facility], [place] [address], [place], [place] [address]    Gram Stain   Final    MODERATE WBC PRESENT, PREDOMINANTLY PMN NO ORGANISMS SEEN    Culture    Final    NO GROWTH 4 DAYS NO ANAEROBES ISOLATED; CULTURE IN PROGRESS FOR 5 DAYS Performed at [facility] [address] [place], [place] [address]    Report Status PENDING  Incomplete         Radiology Studies: No results found.      Scheduled Meds:  allopurinol  150 mg Oral Daily   amiodarone  200 mg Oral Daily   apixaban  5 mg Oral BID   buprenorphine-naloxone  1 tablet Sublingual Daily   docusate sodium  100 mg Oral BID   fluticasone furoate-vilanterol  1 puff Inhalation Daily   gabapentin  200 mg Oral TID   mouth rinse  15 mL Mouth Rinse BID   metoprolol succinate  12.5 mg Oral Daily   mupirocin ointment  1 application  Nasal BID   senna  1 tablet Oral Daily   sodium chloride flush  3 mL Intravenous Q12H   Continuous Infusions:  ceFTAROline (TEFLARO) IV 400 mg (01/02/22 1037)     LOS: 7 days    Time spent: 35 minutes    Jaimin Krupka A Jaquia Benedicto, MD [facility]   If 7PM-7AM, please contact night-coverage [email or web address]  01/02/2022, 12:48 PM

## 2022-01-02 NOTE — TOC Progression Note (Addendum)
Transition of Care [facility]) - Progression Note    Patient Details  Name: [name] MRN: [id number] Date of Birth: 09-14-1945  Transition of Care [facility]) CM/SW Contact  Ajay Strubel, Juliann Pulse, RN Phone Number: 01/02/2022, 11:10 AM  Clinical Narrative: Updated fl2 to reflect-iv abx Teflaro(Ceftaroline)'400mg'$  iv BID x 6weeks to end 02/05/22. PICC line. Await bed choice.    1. 1.2 mi [facility] [address] [place], [place] [address] [phone] Overall rating Below average 2. 1.4 mi [place] at the [place], [place] [address] [phone] Overall rating Above average 3. [place], [place] [address] [phone] Overall rating Much above average 4. 2.3 mi [place], [place] [address] [phone] Overall rating Much below average 5. 2.7 [place] [address], [place] [address] [phone] Overall rating Much below average 6. 3.1 mi [place] [facility] and [place], [place] [address] [phone] Overall rating Average 7. 3.1 mi [facility] for Nursing and [place], [place] [address] [phone] Overall rating Much below average 8. 3.7 mi [place] [address] [place], [place] [address] [phone] Overall rating Much below average 9. 3.8 mi [facility] [place], [place] [address] [phone] Overall rating Much below average 10. 5.4 [place], [place] [address] [phone] Overall rating Average 11. 5.7 mi [facility] at [place], [place]  [address] [phone] Overall rating Much above average 12. 7.2 mi [place], [place] [address] [phone] Overall rating Above average 13. 7.3 mi [facility] and [place], [place] [address] [phone] Overall rating Below average 14. 8.2 [place], [place] [address] [phone] Overall rating Much above average 15. 10.6 mi [facility] [place] [address] [place], [place] [address] [phone] Overall rating Above average 16. 10.9 mi [facility] [address] [place], [place] [address] [phone] Overall rating Much above average 17. 12.5 mi [place] at [facility] [address] [place], [place] [address] [phone] Overall rating Much above average 18. 14.1 mi [facility] [address] [place], [place] [address] [phone] Overall rating Much below average 19. 14.5 [facility] [address], [place] [address] [phone] Overall rating Much below average 20. 15 mi [facility] [place], [place] [address] [phone] Overall rating Average 21. 15.6 mi [facility] [place] [address] [place], [place] [address] [phone] Overall rating Below average 22. 16.1 mi [place] at [place], [place] [address] [phone] Overall rating Above average 23. 16.2 mi [place], [place] [address] [phone] Overall rating Much above average 24. 16.2 mi [place], [place] [address] [phone] Overall rating Below average 25. 16.3 [place] [address] [place],  [address] [phone] Overall rating Much below average 26. 16.9 mi [place]  [address] [place], [place] [address] [phone] Overall rating Above average 27. 18.3 mi Materials engineer at [employer] at [facility], [place] [address] [phone] Overall rating Much above average 28. 19.7 mi [facility] [address] [place], [place] [address] [phone] Overall rating Much below average 29. 20.4 [facility] [address] [place], [place] [address] [phone] Overall rating Below average 30. 20.6 [place], [place] [address] [phone] Overall rating Much above average 31. 20.9 mi [facility] [address] [place], [place] [address] [phone] Overall rating Below average 32. 20.9 [place], [place] [address] [phone] Overall rating Much below average 33. 21.4 mi [facility] [address] [place], [place] [address] [phone] Overall rating Much below average 34. 21.7 mi [facility] [address] [place], [place] [address] [phone] Overall rating Above average 35. 21.8 [place], [place] [address] [phone] Overall rating Not available18 36. 23.2 mi [address] [place], [place] [address] [phone] Overall rating Below average 37. 23.3 mi [facility] [place], [place] [address] [phone] Overall rating Much below average 38. 23.6 [address] [place], [place] [address] [phone] Overall rating Much above average 39. 24.3 [facility] [address] [place], [place]  [address] [phone] Overall rating Much below average 40. 24.7 mi [place] [facility] [place], [place] [address] [phone] Overall rating Average To explore and download  Expected Discharge Plan: [place] Barriers to Discharge: Continued Medical Work up  Expected Discharge Plan and Services Expected Discharge Plan: [place]   Discharge Planning Services: CM Consult   Living arrangements for the past 2 months: Single Family Home                                       Social Determinants of Health (SDOH) Interventions    Readmission Risk Interventions    12/29/2021    3:08 PM 08/24/2021   10:25 AM 05/17/2019    1:32 PM  Readmission Risk Prevention Plan  Transportation Screening Complete Complete Complete  PCP or Specialist Appt within 3-5 Days Complete Complete   HRI or [place] Complete Complete   Social Work Consult for [place] Planning/Counseling Complete Complete   Palliative Care Screening Not Applicable Not Applicable   Medication Review Press photographer) Complete Complete Complete  PCP or Specialist appointment within 3-5 days of discharge   Patient refused  [place] or [place]   Patient refused  [place]   Not Applicable

## 2022-01-02 NOTE — Progress Notes (Signed)
Physical Therapy Treatment Patient Details Name: [name] MRN: [id number] DOB: 1946-03-11 Today's Date: 01/02/2022   History of Present Illness Pt is a 76yo male presenting to [facility] ED on 12/26/21 with back pain and decreased ability to ambulate. MRI reveleaed R>L L3-L4 discitis  6/12 lumbar disc aspiration L3-4  PMH: afib, CHF, COPD, HTN, GERD, gout, substance abuse, syncope, CAD/NSTEMI with multiple cardiac surgeries, MDS, CKDIII, polycythemia.    PT Comments    Scheduled today's session with OT and RN such that pt would have pain medications onboard and session would take place in the therapeutic window. Pt still expressing high degree of pain secondary to gout flare but agreeable to be seen and attempt mobility. Required mod assist +2 for bed mobility, attempted sit to stand and step pivot transfer with RW and mod assist +2, pt safely transferred to [facility] but was unable to bear much weight on BLE. Pt completed BM with increased time, OT observed blood in stool confirmed by PT, RN notified. Used STEDY and mod assist +2 for remainder of session with increased safety and pt comfort/participation; pericare completed by OT with min assist for weight shifts provided by PT. Pt in recliner upon exit with increased alertness and mood. Encourage pt to complete range of motion exercises in all extremities and neck as often as possible and move through the pain to allow clearance of uric acid crystals. We will continue to follow the pt acutely.    Recommendations for follow up therapy are one component of a multi-disciplinary discharge planning process, led by the attending physician.  Recommendations may be updated based on patient status, additional functional criteria and insurance authorization.  Follow Up Recommendations  Skilled nursing-short term rehab (<3 hours/day)     Assistance Recommended at Discharge Frequent or constant Supervision/Assistance  Patient can return home with the  following Two people to help with walking and/or transfers;Two people to help with bathing/dressing/bathroom;Assistance with cooking/housework;Assistance with feeding;Assist for transportation;Help with stairs or ramp for entrance   Equipment Recommendations  Other (comment) (TBD)    Recommendations for Other Services       Precautions / Restrictions Precautions Precautions: Fall Restrictions Weight Bearing Restrictions: No     Mobility  Bed Mobility Overal bed mobility: Needs Assistance Bed Mobility: Supine to Sit     Supine to sit: Mod assist, +2 for physical assistance, HOB elevated, +2 for safety/equipment     General bed mobility comments: Pt required mod assist +2 for bringing BLE off bed and elevating trunk secondary to pain, pt crying out. Pt able to sit EOB without assist.    Transfers Overall transfer level: Needs assistance Equipment used: Rolling walker (2 wheels), Ambulation equipment used Charlaine Dalton) Transfers: Sit to/from Stand, Bed to chair/wheelchair/BSC Sit to Stand: Mod assist, From elevated surface, +2 physical assistance, +2 safety/equipment   Step pivot transfers: Mod assist, +2 physical assistance, +2 safety/equipment, From elevated surface       General transfer comment: Sit to stand (RW): on first trial, pt attempted transfer with RW too far away and was unscuccessful even with assist of +2. Instructed pt to draw RW up to surface of bed, pt able to complete transfer with mod assist +2 and use of BUE to power up, trunk flexed, expressions of pain. Step pivot: Pt attempted several steps but was largely unsuccessful, pushed RW away and switched hands to BSC arms without being directed and "shimmyed" his hips over in a pivoting motion, mod assist +2 for controlled descent to  BSC seat. Pt then had to use BUE to shift hips onto [facility] seat appropriately for BM. Blood in BM, RN notified. Sit to stand (STEDY): pt used BUE to pull up but was unable to clear buttocks to  allow pad to be placed, had to use mod assist to shift weight over to allow pads to be placed under buttocks. Pt with min guard during movement of stedy in room. Stand to sit (STEDY): min guard at most, pt lowered down using BUE. Transfer via Lift Equipment: Stedy  Ambulation/Gait               General Gait Details: deferred   Stairs             Wheelchair Mobility    Modified Rankin (Stroke Patients Only)       Balance                                            Cognition Arousal/Alertness: Awake/alert Behavior During Therapy: WFL for tasks assessed/performed Overall Cognitive Status: Within Functional Limits for tasks assessed                                          Exercises      General Comments General comments (skin integrity, edema, etc.): Pt on RA throughout, VSS.      Pertinent Vitals/Pain Pain Assessment Pain Assessment: Faces Pain Score: 10-Worst pain ever Faces Pain Scale: Hurts worst Pain Location: Back, all extremity joints Pain Descriptors / Indicators: Crying, Discomfort, Grimacing, Moaning, Shooting Pain Intervention(s): Limited activity within patient's tolerance, Monitored during session, Repositioned    Home Living                          Prior Function            PT Goals (current goals can now be found in the care plan section) Acute Rehab PT Goals Patient Stated Goal: Reduce pain PT Goal Formulation: With patient Time For Goal Achievement: 01/15/22 Potential to Achieve Goals: Fair Progress towards PT goals: Progressing toward goals    Frequency    Min 3X/week      PT Plan Current plan remains appropriate    Co-evaluation PT/OT/SLP Co-Evaluation/Treatment: Yes Reason for Co-Treatment: For patient/therapist safety PT goals addressed during session: Mobility/safety with mobility OT goals addressed during session: ADL's and self-care      AM-PAC PT "6 Clicks"  Mobility   Outcome Measure  Help needed turning from your back to your side while in a flat bed without using bedrails?: Total Help needed moving from lying on your back to sitting on the side of a flat bed without using bedrails?: Total Help needed moving to and from a bed to a chair (including a wheelchair)?: Total Help needed standing up from a chair using your arms (e.g., wheelchair or bedside chair)?: Total Help needed to walk in hospital room?: Total Help needed climbing 3-5 steps with a railing? : Total 6 Click Score: 6    End of Session Equipment Utilized During Treatment: Gait belt Charlaine Dalton) Activity Tolerance: Patient limited by pain Patient left: with call bell/phone within reach;in chair;with chair alarm set Nurse Communication: Mobility status PT Visit Diagnosis: Pain;Difficulty in walking, not elsewhere classified (R26.2)  Pain - Right/Left:  (bilateral) Pain - part of body: Shoulder;Arm;Hand;Hip;Knee;Leg;Ankle and joints of foot (and low back)     Time: 3818-4037 PT Time Calculation (min) (ACUTE ONLY): 35 min  Charges:  $Therapeutic Activity: 8-22 mins                     Coolidge Breeze, PT, DPT [place] [facility] Department Office: [phone] Pager: [phone]   Coolidge Breeze 01/02/2022, 1:24 PM

## 2022-01-02 NOTE — Progress Notes (Signed)
PHARMACY CONSULT NOTE FOR:  OUTPATIENT  PARENTERAL ANTIBIOTIC THERAPY (OPAT)  Indication: osteomyelitis Regimen: ceftaroline 400 mg IV BID End date: 02/05/2022  IV antibiotic discharge orders are pended. To discharging provider:  please sign these orders via discharge navigator,  Select New Orders & click on the button choice - Manage This Unsigned Work.     Thank you for allowing pharmacy to be a part of this patient's care.  [name] 01/02/2022, 1:15 PM

## 2022-01-02 NOTE — Plan of Care (Signed)

## 2022-01-03 DIAGNOSIS — M4646 Discitis, unspecified, lumbar region: Secondary | ICD-10-CM | POA: Diagnosis not present

## 2022-01-03 LAB — CULTURE, BLOOD (ROUTINE X 2)
Culture: NO GROWTH
Culture: NO GROWTH

## 2022-01-03 LAB — CBC
HCT: 40.2 % (ref 39.0–52.0)
Hemoglobin: 12.3 g/dL — ABNORMAL LOW (ref 13.0–17.0)
MCH: 30.4 pg (ref 26.0–34.0)
MCHC: 30.6 g/dL (ref 30.0–36.0)
MCV: 99.3 fL (ref 80.0–100.0)
Platelets: 274 10*3/uL (ref 150–400)
RBC: 4.05 MIL/uL — ABNORMAL LOW (ref 4.22–5.81)
RDW: 18.1 % — ABNORMAL HIGH (ref 11.5–15.5)
WBC: 11.9 10*3/uL — ABNORMAL HIGH (ref 4.0–10.5)
nRBC: 0 % (ref 0.0–0.2)

## 2022-01-03 LAB — BASIC METABOLIC PANEL
Anion gap: 7 (ref 5–15)
BUN: 54 mg/dL — ABNORMAL HIGH (ref 8–23)
CO2: 21 mmol/L — ABNORMAL LOW (ref 22–32)
Calcium: 8.7 mg/dL — ABNORMAL LOW (ref 8.9–10.3)
Chloride: 115 mmol/L — ABNORMAL HIGH (ref 98–111)
Creatinine, Ser: 1.34 mg/dL — ABNORMAL HIGH (ref 0.61–1.24)
GFR, Estimated: 56 mL/min — ABNORMAL LOW (ref >60)
Glucose, Bld: 143 mg/dL — ABNORMAL HIGH (ref 70–99)
Potassium: 4.5 mmol/L (ref 3.5–5.1)
Sodium: 143 mmol/L (ref 135–145)

## 2022-01-03 LAB — AEROBIC/ANAEROBIC CULTURE W GRAM STAIN (SURGICAL/DEEP WOUND): Culture: NO GROWTH

## 2022-01-03 MED ORDER — SODIUM CHLORIDE 0.9 % IV SOLN
600.0000 mg | Freq: Two times a day (BID) | INTRAVENOUS | Status: DC
  Administered 2022-01-04 – 2022-01-05 (×4): 600 mg via INTRAVENOUS
  Filled 2022-01-03 (×7): qty 20

## 2022-01-03 NOTE — Plan of Care (Signed)

## 2022-01-03 NOTE — Progress Notes (Signed)
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  Neurosurgery was consulted who recommended IR consultation for drainage and culture of fluid.  Patient underwent biopsy 6/12, antibiotics started.  Hospital course has also been complicated by hypoxia and acute kidney injury secondary to urinary retention.  Foley catheter has been placed.   Assessment & Plan:   Principal Problem:   Discitis of lumbar region Active Problems:   Atrial fibrillation (HCC)   Alcoholism (HCC)   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HCC)   Myeloproliferative neoplasm (HCC)   COPD (chronic obstructive pulmonary disease) (HCC)   PAD (peripheral artery disease) (HCC)   Aortic atherosclerosis (HCC)   CAD S/P percutaneous coronary angioplasty   S/P mitral valve clip implantation  1-Lower back pain secondary to acute osteomyelitis/discitis L3-L 4: -MRI showed evidence of osteomyelitis/discitis. -Status post IR biopsy 6/12. -Continue with Teflaro. IV BID -Ceftriaxone and daptomycin discontinued 6/14 due to concern of elevation in CK -WBC trending down, fever curve trending down.  -IR consulted for central line tunneled for IV antibiotics. He will need total 6 weeks of IV antibiotics. End day antibiotics 02/05/2022.  2-AKI on CKD stage IIIb: Secondary to acute urinary retention, hypovolemia.  CR  baseline 2.2 Creatinine peaked to 4.2 After Foley catheter placed renal function has improved. Treated with IV fluids. Cr  down to 2.0----1.6 NSL fluids to avoid pulmonary edema. Encourage oral intake.  Holding torsemide due to Soft BP   3-Hyperkalemia: Received Lokelma x 3.  4-Acute hypoxemia: Continues to be on 2 -3 L of oxygen. Probably undiagnosed sleep apnea.  Continue with CPAP.  Continue to hold diuretics due to AKI and soft, BP NSL fluids.   Twitching; Chronic for more than 4 months.  Monitor.  ABG negative for hypercapnia.  Mild hypoxemia , placed on oxygen.  Improved.   Congestive heart failure reduced ejection fraction 35% Compensated Continue with Toprol, hel 6/14 due to soft BP.  Holding Farxiga, Aldactone, torsemide due to AKI   CAD status post PCI to mid LAD 09/2020 - On Eliquis and statin.   Severe mitral regurgitation status post TAVR/MitraClip Severe TR/aortic insufficiency -S/P MitraClip placement 11/2021.  back on eliquis -Tricuspid and aortic valve under surveillance by cardiology per cardiology if  Cultures are positive, then he will need TEE. Appreciate his recs.  Blood culture have remain negative   Persistent atrial fibrillation/flutter status post ablation -On Toprol.  History of COPD - Bronchodilators.  I-S/flutter valve.   Obstructive sleep apnea - Likely undiagnosed outpatient.  CPAP nightly    Generalized pain; uric acid check. Elevated at 12. Started  Allopurinol. Will repeat another dose of colchicine.  Reduce gabapentin dose due to AKI>  Constipation; continue with laxative. Had BM    Estimated body mass index is 35.44 kg/m as calculated from the following:   Height as of this encounter: '5\' 6"'$  (1.676 m).   Weight as of this encounter: 99.6 kg.   DVT prophylaxis: Eliquis Code Status: Full code Family Communication: care discussed with patient Disposition Plan:  Status is: Inpatient Remains inpatient  appropriate because: management of discitis    Consultants:  ID  Procedures:  IR lumbar aspiration   Antimicrobials:    Subjective: Back  pain persist. He is more alert, having less tremors.  Had BM.    Objective: Vitals:   01/02/22 2027 01/03/22 0430 01/03/22 0436 01/03/22 1333  BP: 103/61 118/74  (!) 92/54  Pulse: 98 95  98  Resp: 20 20    Temp: 98.7 F (37.1 C) 98 F (36.7 C)  98.2 F (36.8 C)  TempSrc: Oral Oral  Oral  SpO2: 94% 98%  94%  Weight:   99.6 kg   Height:        Intake/Output Summary (Last 24 hours) at 01/03/2022 1404 Last data filed at 01/03/2022 1200 Gross per 24 hour  Intake 343 ml  Output 1175 ml  Net -832 ml    Filed Weights   01/01/22 0646 01/02/22 0500 01/03/22 0436  Weight: 99.7 kg 102.7 kg 99.6 kg    Examination:  General exam: NAD Respiratory system: CTA Cardiovascular system: S 1, S 2 RRR Gastrointestinal system: BS present, soft, nt Central nervous system: Alert, follows command Extremities: trace edema   Data Reviewed: I have personally reviewed following labs and imaging studies  CBC: Recent Labs  Lab 12/30/21 0439 12/31/21 0522 01/01/22 0510 01/02/22 0456 01/03/22 0537  WBC 17.1* 14.0* 11.6* 12.6* 11.9*  HGB 13.3 12.0* 11.7* 13.4 12.3*  HCT 42.8 39.2 37.8* 43.4 40.2  MCV 99.1 100.0 99.0 99.5 99.3  PLT 250 246 220 273 115    Basic Metabolic Panel: Recent Labs  Lab 12/28/21 0550 12/29/21 0446 12/30/21 0439 12/31/21 0522 01/01/22 0510 01/02/22 0456 01/03/22 1014  NA 139   < > 140 143 143 144 143  K 4.1   < > 5.9* 4.3 3.9 4.0 4.5  CL 101   < > 103 112* 113* 112* 115*  CO2 25   < > '26 25 23 24 '$ 21*  GLUCOSE 127*   < > 138* 125* 123* 121* 143*  BUN 53*   < > 77* 86* 74* 62* 54*  CREATININE 2.80*   < > 3.87* 3.18* 2.18* 1.66* 1.34*  CALCIUM 8.1*   < > 8.5* 8.4* 8.0* 8.4* 8.7*  MG 2.4  --   --   --   --   --   --    < > = values in this interval not displayed.    GFR: Estimated Creatinine Clearance: 53.4 mL/min (A) (by C-G formula based on SCr of 1.34 mg/dL (H)). Liver Function Tests: No results for input(s): "AST", "ALT", "ALKPHOS", "BILITOT",  "PROT", "ALBUMIN" in the last 168 hours.  No results for input(s): "LIPASE", "AMYLASE" in the last 168 hours. No results for input(s): "AMMONIA" in the last 168 hours. Coagulation Profile: No results for input(s): "INR", "PROTIME" in the last 168 hours.  Cardiac Enzymes: Recent Labs  Lab 12/30/21 0439 12/31/21 0522 01/01/22 0510  CKTOTAL 509* 1,018* 758*    BNP (last 3 results) No results for input(s): "PROBNP" in the last 8760 hours. HbA1C: No results for input(s): "HGBA1C" in the last 72 hours. CBG: No results for input(s): "GLUCAP" in the last 168 hours. Lipid Profile: No results for input(s): "CHOL", "HDL", "LDLCALC", "TRIG", "CHOLHDL", "LDLDIRECT" in the last 72 hours. Thyroid Function Tests: No results for input(s): "TSH", "T4TOTAL", "FREET4", "T3FREE", "THYROIDAB" in the last 72 hours. Anemia Panel: No results for input(s): "VITAMINB12", "FOLATE", "FERRITIN", "TIBC", "IRON", "RETICCTPCT" in the last 72 hours. Sepsis Labs: No  results for input(s): "PROCALCITON", "LATICACIDVEN" in the last 168 hours.   Recent Results (from the past 240 hour(s))  MRSA Next Gen by PCR, Nasal     Status: Abnormal   Collection Time: 12/26/21 10:26 PM   Specimen: Nasal Mucosa; Nasal Swab  Result Value Ref Range Status   MRSA by PCR Next Gen DETECTED (A) NOT DETECTED Final    Comment: RESULT CALLED TO, READ BACK BY AND VERIFIED WITH: Lear Ng, RN 12/27/2021 @ 38 [facility] (NOTE) The GeneXpert MRSA Assay (FDA approved for NASAL specimens only), is one component of a comprehensive MRSA colonization surveillance program. It is not intended to diagnose MRSA infection nor to guide or monitor treatment for MRSA infections. Test performance is not FDA approved in patients less than 88 years old. Performed at [facility], [place] [address] [place], [place] [address]   Culture, blood (Routine X 2) w Reflex to ID Panel     Status: None   Collection Time: 12/27/21 12:55 AM    Specimen: BLOOD  Result Value Ref Range Status   Specimen Description   Final    BLOOD BLOOD RIGHT HAND Performed at [place] [address] [place], [place] [address]    Special Requests   Final    BOTTLES DRAWN AEROBIC ONLY Blood Culture results may not be optimal due to an inadequate volume of blood received in culture bottles Performed at [place] [address], [place], [place] [address]    Culture   Final    NO GROWTH 5 DAYS Performed at [facility], [place] [address], [place], [place] [address]    Report Status 01/01/2022 FINAL  Final  Culture, blood (Routine X 2) w Reflex to ID Panel     Status: None   Collection Time: 12/27/21 12:55 AM   Specimen: BLOOD  Result Value Ref Range Status   Specimen Description   Final    BLOOD RIGHT ANTECUBITAL Performed at [place] [address], [place], [place] [address]    Special Requests   Final    BOTTLES DRAWN AEROBIC ONLY Blood Culture adequate volume Performed at [place] [address] [place], [place] [address]    Culture   Final    NO GROWTH 5 DAYS Performed at [facility], [place] [address] [place] [address] [place], [place] [address]    Report Status 01/01/2022 FINAL  Final  Culture, blood (Routine X 2) w Reflex to ID Panel     Status: None   Collection Time: 12/29/21  8:04 AM   Specimen: BLOOD  Result Value Ref Range Status   Specimen Description   Final    BLOOD RIGHT ANTECUBITAL Performed at [place] [address], [place], [place] [address]    Special Requests   Final    IN PEDIATRIC BOTTLE Blood Culture results may not be optimal due to an inadequate volume of blood received in culture bottles Performed at [place] [address] [place] [address] [place], [place] [address]    Culture   Final    NO GROWTH 5 DAYS Performed at [facility], [place] [address], [place], [place] [address]     Report Status 01/03/2022 FINAL  Final  Culture, blood (Routine X 2) w Reflex to ID Panel     Status: None   Collection Time: 12/29/21  8:10 AM   Specimen: BLOOD  Result Value Ref Range Status  Specimen Description   Final    BLOOD BLOOD LEFT HAND Performed at [place] [address], [place], [place] [address]    Special Requests   Final    BOTTLES DRAWN AEROBIC ONLY Blood Culture results may not be optimal due to an inadequate volume of blood received in culture bottles Performed at [place] [address], [place], [place] [address]    Culture   Final    NO GROWTH 5 DAYS Performed at [facility], [place] [address], [place], [place] [address]    Report Status 01/03/2022 FINAL  Final  Urine Culture     Status: None   Collection Time: 12/29/21  8:44 AM   Specimen: Urine, Clean Catch  Result Value Ref Range Status   Specimen Description   Final    URINE, CLEAN CATCH Performed at [facility], [place] [address] [place], [place] [address]    Special Requests   Final    NONE Performed at [facility], [place] [address] [place], [place] [address]    Culture   Final    NO GROWTH Performed at [facility], [place] [address] [place], [place] [address]    Report Status 12/30/2021 FINAL  Final  Aerobic/Anaerobic Culture w Gram Stain (surgical/deep wound)     Status: None   Collection Time: 12/29/21  4:34 PM   Specimen: Wound; Abscess  Result Value Ref Range Status   Specimen Description   Final    WOUND Performed at [place] [address] [place], [place] [address]    Special Requests   Final    ABSCESS Performed at [facility], [place] [address] [place], [place] [address]    Gram Stain   Final    MODERATE WBC PRESENT, PREDOMINANTLY PMN NO ORGANISMS SEEN    Culture   Final    No growth aerobically or anaerobically. Performed at [facility], [place] [address], [place], [place] [address]    Report Status 01/03/2022 FINAL  Final         Radiology Studies: No results found.      Scheduled Meds:  allopurinol  150 mg Oral Daily   amiodarone  200 mg Oral Daily   apixaban  5 mg Oral BID   buprenorphine-naloxone  1 tablet Sublingual Daily   Chlorhexidine Gluconate Cloth  6 each Topical Daily   docusate sodium  100 mg Oral BID   fluticasone furoate-vilanterol  1 puff Inhalation Daily   gabapentin  200 mg Oral TID   mouth rinse  15 mL Mouth Rinse BID   metoprolol succinate  12.5 mg Oral Daily   senna  1 tablet Oral Daily   sodium chloride flush  3 mL Intravenous Q12H   Continuous Infusions:  ceFTAROline (TEFLARO) IV 400 mg (01/03/22 1029)     LOS: 8 days    Time spent: 35 minutes    Gryphon Vanderveen A Diella Gillingham, MD [facility]   If 7PM-7AM, please contact night-coverage [email or web address]  01/03/2022, 2:04 PM

## 2022-01-03 NOTE — Progress Notes (Signed)
PHARMACY NOTE:  ANTIMICROBIAL RENAL DOSAGE ADJUSTMENT  Current antimicrobial regimen includes a mismatch between antimicrobial dosage and estimated renal function.  As per policy approved by the Pharmacy & Therapeutics and Medical Executive Committees, the antimicrobial dosage will be adjusted accordingly.  Current antimicrobial dosage:  ceftaroline '400mg'$  IV q12 hours  Indication: osteomyelitis  Renal Function:  Estimated Creatinine Clearance: 53.4 mL/min (A) (by C-G formula based on SCr of 1.34 mg/dL (H)). '[]'$      On intermittent HD, scheduled: '[]'$      On CRRT    Antimicrobial dosage has been changed to:  ceftaroline '600mg'$  IV q12 hours  Additional comments: OPAT orders will be updated to reflect dosing changes since renal function continues to improve.    Thank you for allowing pharmacy to be a part of this patient's care.  Dimple Nanas, [facility] 01/03/2022 2:22 PM

## 2022-01-04 DIAGNOSIS — M4646 Discitis, unspecified, lumbar region: Secondary | ICD-10-CM | POA: Diagnosis not present

## 2022-01-04 LAB — COMPREHENSIVE METABOLIC PANEL
ALT: 30 U/L (ref 0–44)
AST: 33 U/L (ref 15–41)
Albumin: 2.2 g/dL — ABNORMAL LOW (ref 3.5–5.0)
Alkaline Phosphatase: 126 U/L (ref 38–126)
Anion gap: 9 (ref 5–15)
BUN: 48 mg/dL — ABNORMAL HIGH (ref 8–23)
CO2: 23 mmol/L (ref 22–32)
Calcium: 8.6 mg/dL — ABNORMAL LOW (ref 8.9–10.3)
Chloride: 112 mmol/L — ABNORMAL HIGH (ref 98–111)
Creatinine, Ser: 1.31 mg/dL — ABNORMAL HIGH (ref 0.61–1.24)
GFR, Estimated: 57 mL/min — ABNORMAL LOW (ref >60)
Glucose, Bld: 98 mg/dL (ref 70–99)
Potassium: 4.5 mmol/L (ref 3.5–5.1)
Sodium: 144 mmol/L (ref 135–145)
Total Bilirubin: 0.9 mg/dL (ref 0.3–1.2)
Total Protein: 6.2 g/dL — ABNORMAL LOW (ref 6.5–8.1)

## 2022-01-04 LAB — CBC
HCT: 40.7 % (ref 39.0–52.0)
Hemoglobin: 12.5 g/dL — ABNORMAL LOW (ref 13.0–17.0)
MCH: 30.9 pg (ref 26.0–34.0)
MCHC: 30.7 g/dL (ref 30.0–36.0)
MCV: 100.5 fL — ABNORMAL HIGH (ref 80.0–100.0)
Platelets: 270 10*3/uL (ref 150–400)
RBC: 4.05 MIL/uL — ABNORMAL LOW (ref 4.22–5.81)
RDW: 18 % — ABNORMAL HIGH (ref 11.5–15.5)
WBC: 12.8 10*3/uL — ABNORMAL HIGH (ref 4.0–10.5)
nRBC: 0.2 % (ref 0.0–0.2)

## 2022-01-04 LAB — CK: Total CK: 75 U/L (ref 49–397)

## 2022-01-04 LAB — URIC ACID: Uric Acid, Serum: 9.3 mg/dL — ABNORMAL HIGH (ref 3.7–8.6)

## 2022-01-04 MED ORDER — ORAL CARE MOUTH RINSE: 15.0000 mL | OROMUCOSAL | Status: DC | PRN

## 2022-01-04 MED ORDER — TORSEMIDE 20 MG PO TABS
20.0000 mg | ORAL_TABLET | Freq: Every day | ORAL | Status: DC
  Administered 2022-01-05: 20 mg via ORAL
  Filled 2022-01-04: qty 1

## 2022-01-04 MED ORDER — ATORVASTATIN CALCIUM 40 MG PO TABS
80.0000 mg | ORAL_TABLET | Freq: Every day | ORAL | Status: DC
  Administered 2022-01-04 – 2022-01-05 (×2): 80 mg via ORAL
  Filled 2022-01-04 (×2): qty 2

## 2022-01-04 MED ORDER — ORAL CARE MOUTH RINSE
15.0000 mL | OROMUCOSAL | Status: DC
  Administered 2022-01-04 – 2022-01-05 (×6): 15 mL via OROMUCOSAL

## 2022-01-04 NOTE — Progress Notes (Signed)
Pt is currently refusing BIPAP QHS.

## 2022-01-04 NOTE — Plan of Care (Signed)

## 2022-01-04 NOTE — Progress Notes (Signed)
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  Neurosurgery was consulted who recommended IR consultation for drainage and culture of fluid.  Patient underwent biopsy 6/12, antibiotics started.  Hospital course has also been complicated by hypoxia and acute kidney injury secondary to urinary retention.  Foley catheter was  placed.   Assessment & Plan:   Principal Problem:   Discitis of lumbar region Active Problems:   Atrial fibrillation (HCC)   Alcoholism (HCC)   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HCC)   Myeloproliferative neoplasm (HCC)   COPD (chronic obstructive pulmonary disease) (HCC)   PAD (peripheral artery disease) (HCC)   Aortic atherosclerosis (HCC)   CAD S/P percutaneous coronary angioplasty   S/P mitral valve clip implantation  1-Lower back pain secondary to acute osteomyelitis/discitis L3-L 4: -MRI showed evidence of osteomyelitis/discitis. -Status post IR biopsy 6/12. -Continue with Teflaro. IV BID -Ceftriaxone and daptomycin discontinued 6/14 due to concern of elevation in CK -WBC trending down, fever curve trending down.  -IR consulted for central line tunneled for IV antibiotics. He will need total 6 weeks of IV antibiotics. End day antibiotics 02/05/2022.  2-AKI on CKD stage IIIb: Secondary to acute urinary retention, hypovolemia.  CR  baseline 2.2 Creatinine peaked to 4.2 After Foley catheter placed renal function has improved. Treated with IV fluids. Cr down  to 2.0----1.6--1.3 NSL fluids to avoid pulmonary edema.  Plan to resume torsemide tomorrow.  Improved.   3-Hyperkalemia: Received Lokelma x 3.  4-Acute hypoxemia: Continues to be on 2 -3 L of oxygen. Probably undiagnosed sleep apnea.  Continue with CPAP.  Continue to hold diuretics due to AKI and soft, BP NSL fluids.  Resume torsemide tomorrow.   Twitching; Chronic for more than 4 months.  Monitor.  ABG negative for hypercapnia.  Mild hypoxemia , placed on oxygen.  Improved with improvement of renal function, discontinuation of Morphine and decreased dose of gabapentin.   Congestive heart failure reduced ejection fraction 35% Compensated Continue with Toprol, hel 6/14 due to soft BP.  Holding Farxiga, Aldactone, torsemide due to AKI Resume torsemide 6/19.  CAD status post PCI to mid LAD 09/2020 - On Eliquis and statin.   Severe mitral regurgitation status post TAVR/MitraClip Severe TR/aortic insufficiency -S/P MitraClip placement 11/2021.  back on eliquis -Tricuspid and aortic valve under surveillance by cardiology per cardiology if  Cultures are positive, then he will need TEE. Appreciate his recs.  Blood culture have remain negative   Persistent atrial fibrillation/flutter status post ablation -On Toprol.  History of COPD - Bronchodilators.  I-S/flutter valve.   Obstructive sleep apnea - Likely undiagnosed outpatient.  CPAP nightly    Generalized pain; uric acid check. Elevated at 12. Started  Allopurinol. Will repeat another dose of colchicine.  Reduce gabapentin dose due to AKI>  Constipation; continue with laxative. Had BM    Estimated body mass index is 36.22 kg/m as calculated from the following:   Height as of this encounter: '5\' 6"'$  (1.676 m).   Weight as of this encounter: 101.8 kg.   DVT prophylaxis: Eliquis Code  Status: Full code Family Communication: care discussed with patient Disposition Plan:  Status is: Inpatient Remains inpatient appropriate  because: management of discitis    Consultants:  ID  Procedures:  IR lumbar aspiration   Antimicrobials:    Subjective: He is more alert, answering question,. He report back pain, but is not worse.  Breathing better. Less tremors, twitching.   Objective: Vitals:   01/04/22 0500 01/04/22 0522 01/04/22 0556 01/04/22 1210  BP:  121/65  111/67  Pulse:  70  76  Resp:  20  20  Temp:  97.7 F (36.5 C)  97.8 F (36.6 C)  TempSrc:  Oral  Oral  SpO2:  95% 93% 95%  Weight: 101.8 kg     Height:        Intake/Output Summary (Last 24 hours) at 01/04/2022 1601 Last data filed at 01/04/2022 1500 Gross per 24 hour  Intake 440 ml  Output 600 ml  Net -160 ml    Filed Weights   01/02/22 0500 01/03/22 0436 01/04/22 0500  Weight: 102.7 kg 99.6 kg 101.8 kg    Examination:  General exam: NAD Respiratory system: CTA Cardiovascular system: S 1, S 2 RRR Gastrointestinal system: BS present, soft, nt Central nervous system: Alert, follows command.  Extremities: Trace edema.    Data Reviewed: I have personally reviewed following labs and imaging studies  CBC: Recent Labs  Lab 12/31/21 0522 01/01/22 0510 01/02/22 0456 01/03/22 0537 01/04/22 0524  WBC 14.0* 11.6* 12.6* 11.9* 12.8*  HGB 12.0* 11.7* 13.4 12.3* 12.5*  HCT 39.2 37.8* 43.4 40.2 40.7  MCV 100.0 99.0 99.5 99.3 100.5*  PLT 246 220 273 274 696    Basic Metabolic Panel: Recent Labs  Lab 12/31/21 0522 01/01/22 0510 01/02/22 0456 01/03/22 1014 01/04/22 0524  NA 143 143 144 143 144  K 4.3 3.9 4.0 4.5 4.5  CL 112* 113* 112* 115* 112*  CO2 '25 23 24 '$ 21* 23  GLUCOSE 125* 123* 121* 143* 98  BUN 86* 74* 62* 54* 48*  CREATININE 3.18* 2.18* 1.66* 1.34* 1.31*  CALCIUM 8.4* 8.0* 8.4* 8.7* 8.6*    GFR: Estimated Creatinine Clearance: 55.3 mL/min (A) (by C-G formula based on SCr of 1.31 mg/dL (H)). Liver Function Tests: Recent Labs  Lab 01/04/22 0524  AST 33  ALT 30  ALKPHOS 126  BILITOT 0.9  PROT 6.2*   ALBUMIN 2.2*    No results for input(s): "LIPASE", "AMYLASE" in the last 168 hours. No results for input(s): "AMMONIA" in the last 168 hours. Coagulation Profile: No results for input(s): "INR", "PROTIME" in the last 168 hours.  Cardiac Enzymes: Recent Labs  Lab 12/30/21 0439 12/31/21 0522 01/01/22 0510 01/04/22 0524  CKTOTAL 509* 1,018* 758* 75    BNP (last 3 results) No results for input(s): "PROBNP" in the last 8760 hours. HbA1C: No results for input(s): "HGBA1C" in the last 72 hours. CBG: No results for input(s): "GLUCAP" in the last 168 hours. Lipid Profile: No results for input(s): "CHOL", "HDL", "LDLCALC", "TRIG", "CHOLHDL", "LDLDIRECT" in the last 72 hours. Thyroid Function Tests: No results for input(s): "TSH", "T4TOTAL", "FREET4", "T3FREE", "THYROIDAB" in the last 72 hours. Anemia Panel: No results for input(s): "VITAMINB12", "FOLATE", "FERRITIN", "TIBC", "IRON", "RETICCTPCT" in the last 72 hours. Sepsis Labs: No results for input(s): "PROCALCITON", "LATICACIDVEN" in the last 168 hours.   Recent Results (from the past 240 hour(s))  MRSA Next Gen by PCR, Nasal     Status: Abnormal   Collection Time: 12/26/21 10:26 PM  Specimen: Nasal Mucosa; Nasal Swab  Result Value Ref Range Status   MRSA by PCR Next Gen DETECTED (A) NOT DETECTED Final    Comment: RESULT CALLED TO, READ BACK BY AND VERIFIED WITH: Lear Ng, RN 12/27/2021 @ 22 [facility] (NOTE) The GeneXpert MRSA Assay (FDA approved for NASAL specimens only), is one component of a comprehensive MRSA colonization surveillance program. It is not intended to diagnose MRSA infection nor to guide or monitor treatment for MRSA infections. Test performance is not FDA approved in patients less than 58 years old. Performed at [facility], [place] [address], [place], [place] [address]   Culture, blood (Routine X 2) w Reflex to ID Panel     Status: None   Collection Time: 12/27/21 12:55 AM   Specimen:  BLOOD  Result Value Ref Range Status   Specimen Description   Final    BLOOD BLOOD RIGHT HAND Performed at [facility] [address], [place], [place] [address]    Special Requests   Final    BOTTLES DRAWN AEROBIC ONLY Blood Culture results may not be optimal due to an inadequate volume of blood received in culture bottles Performed at [facility] [address], [place], [place] [address]    Culture   Final    NO GROWTH 5 DAYS Performed at [facility], [place] [address] [place], [place] [address]    Report Status 01/01/2022 FINAL  Final  Culture, blood (Routine X 2) w Reflex to ID Panel     Status: None   Collection Time: 12/27/21 12:55 AM   Specimen: BLOOD  Result Value Ref Range Status   Specimen Description   Final    BLOOD RIGHT ANTECUBITAL Performed at [facility] [address], [place], [place] [address]    Special Requests   Final    BOTTLES DRAWN AEROBIC ONLY Blood Culture adequate volume Performed at [facility] [address], [place], [place] [address]    Culture   Final    NO GROWTH 5 DAYS Performed at [facility], [place] [address], [place], [place] [address]    Report Status 01/01/2022 FINAL  Final  Culture, blood (Routine X 2) w Reflex to ID Panel     Status: None   Collection Time: 12/29/21  8:04 AM   Specimen: BLOOD  Result Value Ref Range Status   Specimen Description   Final    BLOOD RIGHT ANTECUBITAL Performed at [facility] [address], [place], [place] [address]    Special Requests   Final    IN PEDIATRIC BOTTLE Blood Culture results may not be optimal due to an inadequate volume of blood received in culture bottles Performed at [facility] [address], [place], [place] [address]    Culture   Final    NO GROWTH 5 DAYS Performed at [facility], [place] [address], [place], [place] [address]    Report  Status 01/03/2022 FINAL  Final  Culture, blood (Routine X 2) w Reflex to ID Panel     Status: None   Collection Time: 12/29/21  8:10 AM   Specimen: BLOOD  Result Value Ref Range Status   Specimen Description   Final    BLOOD BLOOD LEFT HAND Performed at [facility] [address], [place], [place] [address]    Special Requests   Final    BOTTLES DRAWN AEROBIC ONLY  Blood Culture results may not be optimal due to an inadequate volume of blood received in culture bottles Performed at [facility] [address], [place], [place] [address]    Culture   Final    NO GROWTH 5 DAYS Performed at [facility], [place] [address] [place], [place] [address]    Report Status 01/03/2022 FINAL  Final  Urine Culture     Status: None   Collection Time: 12/29/21  8:44 AM   Specimen: Urine, Clean Catch  Result Value Ref Range Status   Specimen Description   Final    URINE, CLEAN CATCH Performed at [facility], [place] [address] [place], [place] [address]    Special Requests   Final    NONE Performed at [facility], [place] [address], [place], [place] [address]    Culture   Final    NO GROWTH Performed at [facility], [place] [address] [place], [place] [address]    Report Status 12/30/2021 FINAL  Final  Aerobic/Anaerobic Culture w Gram Stain (surgical/deep wound)     Status: None   Collection Time: 12/29/21  4:34 PM   Specimen: Wound; Abscess  Result Value Ref Range Status   Specimen Description   Final    WOUND Performed at [facility] [address], [place], [place] [address]    Special Requests   Final    ABSCESS Performed at [facility], [place] [address] [place], [place] [address]    Gram Stain   Final    MODERATE WBC PRESENT, PREDOMINANTLY PMN NO ORGANISMS SEEN    Culture   Final    No growth aerobically or anaerobically. Performed at [place], [place] [address], [place],  [address]    Report Status 01/03/2022 FINAL  Final         Radiology Studies: No results found.      Scheduled Meds:  allopurinol  150 mg Oral Daily   amiodarone  200 mg Oral Daily   apixaban  5 mg Oral BID   atorvastatin  80 mg Oral Daily   buprenorphine-naloxone  1 tablet Sublingual Daily   Chlorhexidine Gluconate Cloth  6 each Topical Daily   docusate sodium  100 mg Oral BID   fluticasone furoate-vilanterol  1 puff Inhalation Daily   gabapentin  200 mg Oral TID   mouth rinse  15 mL Mouth Rinse BID   metoprolol succinate  12.5 mg Oral Daily   mouth rinse  15 mL Mouth Rinse 4 times per day   senna  1 tablet Oral Daily   sodium chloride flush  3 mL Intravenous Q12H   [START ON 01/05/2022] torsemide  20 mg Oral Daily   Continuous Infusions:  ceFTAROline (TEFLARO) IV 600 mg (01/04/22 0958)     LOS: 9 days    Time spent: 35 minutes    Taejon Irani A Anjelita Sheahan, MD [facility]   If 7PM-7AM, please contact night-coverage [email or web address]  01/04/2022, 4:01 PM

## 2022-01-05 ENCOUNTER — Inpatient Hospital Stay (HOSPITAL_COMMUNITY): Payer: Medicare Other

## 2022-01-05 DIAGNOSIS — M4646 Discitis, unspecified, lumbar region: Secondary | ICD-10-CM | POA: Diagnosis not present

## 2022-01-05 HISTORY — PX: IR PERC TUN PERIT CATH WO PORT S&I /IMAG: IMG2327

## 2022-01-05 HISTORY — PX: IR US GUIDE VASC ACCESS RIGHT: IMG2390

## 2022-01-05 HISTORY — PX: IR FLUORO GUIDE CV LINE RIGHT: IMG2283

## 2022-01-05 LAB — CBC
HCT: 40.4 % (ref 39.0–52.0)
Hemoglobin: 12.3 g/dL — ABNORMAL LOW (ref 13.0–17.0)
MCH: 30.2 pg (ref 26.0–34.0)
MCHC: 30.4 g/dL (ref 30.0–36.0)
MCV: 99.3 fL (ref 80.0–100.0)
Platelets: 278 10*3/uL (ref 150–400)
RBC: 4.07 MIL/uL — ABNORMAL LOW (ref 4.22–5.81)
RDW: 18 % — ABNORMAL HIGH (ref 11.5–15.5)
WBC: 11.6 10*3/uL — ABNORMAL HIGH (ref 4.0–10.5)
nRBC: 0 % (ref 0.0–0.2)

## 2022-01-05 LAB — BASIC METABOLIC PANEL
Anion gap: 5 (ref 5–15)
BUN: 47 mg/dL — ABNORMAL HIGH (ref 8–23)
CO2: 25 mmol/L (ref 22–32)
Calcium: 8.7 mg/dL — ABNORMAL LOW (ref 8.9–10.3)
Chloride: 113 mmol/L — ABNORMAL HIGH (ref 98–111)
Creatinine, Ser: 1.25 mg/dL — ABNORMAL HIGH (ref 0.61–1.24)
GFR, Estimated: >60 mL/min (ref >60)
Glucose, Bld: 105 mg/dL — ABNORMAL HIGH (ref 70–99)
Potassium: 4.9 mmol/L (ref 3.5–5.1)
Sodium: 143 mmol/L (ref 135–145)

## 2022-01-05 MED ORDER — BUPRENORPHINE HCL-NALOXONE HCL 8-2 MG SL SUBL: 1.0000 | SUBLINGUAL_TABLET | Freq: Every day | SUBLINGUAL | 0 refills | Status: AC

## 2022-01-05 MED ORDER — ALLOPURINOL 300 MG PO TABS: 150.0000 mg | ORAL_TABLET | Freq: Every day | ORAL | Status: AC

## 2022-01-05 MED ORDER — LIDOCAINE-EPINEPHRINE 1 %-1:100000 IJ SOLN
INTRAMUSCULAR | Status: AC
  Filled 2022-01-05: qty 1

## 2022-01-05 MED ORDER — TORSEMIDE 20 MG PO TABS: 20.0000 mg | ORAL_TABLET | Freq: Every day | ORAL | 0 refills | Status: AC

## 2022-01-05 MED ORDER — HYDROCODONE-ACETAMINOPHEN 5-325 MG PO TABS: 1.0000 | ORAL_TABLET | ORAL | 0 refills | Status: AC | PRN

## 2022-01-05 MED ORDER — LIDOCAINE-EPINEPHRINE 0.5 %-1:200000 IJ SOLN
INTRAMUSCULAR | Status: DC | PRN
  Administered 2022-01-05: 15 mL

## 2022-01-05 MED ORDER — BUPRENORPHINE HCL-NALOXONE HCL 8-2 MG SL SUBL: 1.0000 | SUBLINGUAL_TABLET | Freq: Every day | SUBLINGUAL | Status: DC

## 2022-01-05 MED ORDER — TORSEMIDE 20 MG PO TABS: 20.0000 mg | ORAL_TABLET | Freq: Every day | ORAL | Status: DC

## 2022-01-05 MED ORDER — CEFTAROLINE IV (FOR PTA / DISCHARGE USE ONLY): 600.0000 mg | Freq: Two times a day (BID) | INTRAVENOUS | 0 refills | Status: AC

## 2022-01-05 MED ORDER — HYDROCODONE-ACETAMINOPHEN 5-325 MG PO TABS: 1.0000 | ORAL_TABLET | ORAL | 0 refills | Status: DC | PRN

## 2022-01-05 NOTE — Procedures (Signed)
Interventional Radiology Procedure:   Indications: Lumbar osteomyelitis/discitis  Procedure: Tunneled central line placement  Findings: Right jugular single lumen Powerline, tip at SVC/RA junction.   Length 25 cm.  Complications: No immediate complications noted.     EBL: Minimal  Plan: Central line is ready to use.    [name] R. Anselm Pancoast, MD  Pager: [phone]

## 2022-01-05 NOTE — Discharge Summary (Signed)
Physician Discharge Summary   Patient: [name] MRN: [id number] DOB: 1946/07/08  Admit date:     12/26/2021  Discharge date: 01/05/22  Discharge Physician: Elmarie Shiley   PCP: Marianna Payment, MD   Recommendations at discharge:   Needs to follow up with ID for further management of Diskitis.  Needs IV antibiotics through 7/20. Monitor renal function on resumption of torsemide and farxiga.    Discharge Diagnoses: Principal Problem:   Discitis of lumbar region Active Problems:   Atrial fibrillation (HCC)   Alcoholism (HCC)   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HCC)   Myeloproliferative neoplasm (HCC)   COPD (chronic obstructive pulmonary disease) (HCC)   PAD (peripheral artery disease) (HCC)   Aortic atherosclerosis (HCC)   CAD S/P percutaneous coronary angioplasty   S/P mitral valve clip implantation  Resolved Problems:   * No resolved hospital problems. 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  Neurosurgery was consulted who recommended IR consultation for drainage and culture of fluid.  Patient underwent biopsy 6/12, antibiotics started.  Hospital course has also been complicated by hypoxia and acute kidney injury secondary to urinary retention.  Foley catheter was  placed.  He was diagnosed with Acute osteomyelitis, Diskitis L 3-L 4. He will be discharge on Teflaro IV until 7/20. His renal function significantly improved. His Torsemide will be resume 7/19. He is stable for rehab. He had Tunneled central line paced by IR 6/19.  Assessment and Plan:   1-Lower back  pain secondary to acute osteomyelitis/discitis L3-L 4: -MRI showed evidence of osteomyelitis/discitis. -Status post IR biopsy 6/12. -Continue with Teflaro. IV BID -Ceftriaxone and daptomycin discontinued 6/14 due to concern of elevation in CK -WBC trending down, fever curve trending down.  -IR consulted for central line tunneled for IV antibiotics, had line place 6/19. He will need total 6 weeks of IV antibiotics. End day antibiotics 02/05/2022.   2-AKI on CKD stage IIIb: Secondary to acute urinary retention, hypovolemia.  CR  baseline 2.2 Creatinine peaked to 4.2 After Foley catheter placed renal function has improved. Treated with IV fluids. Cr down to 2.0----1.6--1.3 NSL fluids to avoid pulmonary edema.  Plan to resume torsemide today.  Improved, cr better than baseline.    3-Hyperkalemia: Received Lokelma x 3.   4-Acute hypoxemia: Continues to be on 2 -3 L of oxygen. Probably undiagnosed sleep apnea.  Continue with CPAP.  Continue to hold diuretics due to AKI and soft, BP NSL fluids.  Resume torsemide tomorrow.    Twitching; Chronic for more than 4 months.  Monitor.  ABG negative for hypercapnia.  Mild hypoxemia , placed on oxygen.  Improved with improvement of renal function, discontinuation of Morphine and decreased dose of gabapentin.    Congestive heart failure reduced ejection fraction 35% Compensated Continue with Toprol, hel 6/14 due to soft BP.  Holding Farxiga, Aldactone, torsemide due to AKI on admission. Plan to resume [place] at discharge and Torsemide today    CAD status post PCI to mid LAD 09/2020 - On Eliquis and statin.   Severe mitral regurgitation status post TAVR/MitraClip Severe TR/aortic insufficiency -S/P MitraClip placement 11/2021.  back on eliquis -Tricuspid and  aortic valve under surveillance by cardiology per cardiology if  Cultures are positive, then he will need TEE. Appreciate his recs.  Blood culture have remain negative   Persistent  atrial fibrillation/flutter status post ablation -On Toprol.  History of COPD - Bronchodilators.  I-S/flutter valve.   Obstructive sleep apnea - Likely undiagnosed outpatient.  CPAP nightly    Generalized pain; uric acid check. Elevated at 12. Started  Allopurinol. Will repeat another dose of colchicine.  Reduce gabapentin dose due to AKI>  Constipation; continue with laxative. Had BM         Consultants: ID, IR Procedures performed: Status post IR biopsy 6/12 Disposition: Skilled nursing facility Diet recommendation:  Discharge Diet Orders (From admission, onward)     Start     Ordered   01/05/22 0000  Diet - low sodium heart healthy        01/05/22 0920           Cardiac diet DISCHARGE MEDICATION: Allergies as of 01/05/2022   No Known Allergies      Medication List     STOP taking these medications    camphor-menthol lotion Commonly known as: SARNA   diclofenac Sodium 1 % Gel Commonly known as: VOLTAREN   hydrocortisone cream 1 %   potassium chloride SA 20 MEQ tablet Commonly known as: KLOR-CON M   spironolactone 25 MG tablet Commonly known as: ALDACTONE   Suboxone 8-2 MG Film Generic drug: Buprenorphine HCl-Naloxone HCl Replaced by: buprenorphine-naloxone 8-2 mg Subl SL tablet       TAKE these medications    acetaminophen 500 MG tablet Commonly known as: TYLENOL Take 2 tablets (1,000 mg total) by mouth every 6 (six) hours as needed for moderate pain. What changed: reasons to take this   albuterol 108 (90 Base) MCG/ACT inhaler Commonly known as: ProAir HFA Inhale 1-2 puffs into the lungs every 6 (six) hours as needed for wheezing or shortness of breath.   allopurinol 300 MG tablet Commonly known as: ZYLOPRIM Take 0.5 tablets (150 mg total) by mouth daily.   amiodarone 200 MG tablet Commonly known as: PACERONE Take 1 tablet (200 mg total) by mouth daily.   apixaban 5 MG Tabs tablet Commonly known as: Eliquis Take 1 tablet (5 mg  total) by mouth 2 (two) times daily.   atorvastatin 80 MG tablet Commonly known as: LIPITOR Take 1 tablet (80 mg total) by mouth daily.   buprenorphine-naloxone 8-2 mg Subl SL tablet Commonly known as: SUBOXONE Place 1 tablet under the tongue daily. Replaces: Suboxone 8-2 MG Film   ceftaroline  IVPB Commonly known as: TEFLARO Inject 600 mg into the vein every 12 (twelve) hours. Indication:  osteomyelitis First Dose: Yes Last Day of Therapy:  02/05/2022 Labs - Once weekly:  CBC/D, CMP, ESR, and CRP Method of administration: Mini-Bag Plus / Control-A-Flo (CAF) Method of administration may be changed at the discretion of home infusion pharmacist based upon assessment of the patient and/or caregiver's ability to self-administer the medication ordered.   dapagliflozin propanediol 10 MG Tabs tablet Commonly known as: Farxiga Take 1 tablet (10 mg total) by mouth daily before breakfast.   gabapentin 100 MG capsule Commonly known as: NEURONTIN Take 1 capsule (100 mg total) by mouth 2 (two) times daily.   HYDROcodone-acetaminophen 5-325 MG tablet Commonly known as: NORCO/VICODIN Take 1-2 tablets by mouth every 4 (four) hours as needed for moderate pain.   metoprolol succinate 25 MG 24 hr tablet Commonly known as: TOPROL-XL Take 0.5 tablets (  12.5 mg total) by mouth daily.   senna-docusate 8.6-50 MG tablet Commonly known as: Senokot-S Take 1 tablet by mouth at bedtime as needed for mild constipation.   Symbicort 160-4.5 MCG/ACT inhaler Generic drug: budesonide-formoterol Inhale 2 puffs into the lungs daily as needed (SOB).   torsemide 20 MG tablet Commonly known as: DEMADEX Take 1 tablet (20 mg total) by mouth daily. What changed:  how much to take when to take this               Discharge Care Instructions  (From admission, onward)           Start     Ordered   01/05/22 0000  Change dressing on IV access line weekly and PRN  (Home infusion instructions -  [facility] )        01/05/22 0920            Follow-up Information     Vanetta Mulders, MD.   Specialty: Orthopedic Surgery Why: As needed, If symptoms worsen Contact information: [address] [place] [address] [phone]                Discharge Exam: Danley Danker Weights   01/03/22 0436 01/04/22 0500 01/05/22 0500  Weight: 99.6 kg 101.8 kg 108.2 kg   General; NAD Lung CTA  Condition at discharge: stable  The results of significant diagnostics from this hospitalization (including imaging, microbiology, ancillary and laboratory) are listed below for reference.   Imaging Studies: DG CHEST PORT 1 VIEW  Result Date: 12/30/2021 CLINICAL DATA:  76 year old male with respiratory compromise. Atrial fibrillation. EXAM: PORTABLE CHEST 1 VIEW COMPARISON:  Chest radiographs 11/18/2021 and earlier. FINDINGS: Portable AP semi upright view at 0348 hours. Cardiac and mediastinal contours are stable and within normal limits. Left chest loop recorder or lead less ICD is unchanged. Visualized tracheal air column is within normal limits. Stable lung markings. Allowing for portable technique the lungs are clear. No pneumothorax or pleural effusion. Bulky degeneration at the right shoulder glenohumeral joint. No acute osseous abnormality identified. Negative visible bowel gas. IMPRESSION: No acute cardiopulmonary abnormality. Electronically Signed   By: Genevie Ann M.D.   On: 12/30/2021 04:25   IR LUMBAR DISC ASPIRATION W/IMG GUIDE  Result Date: 12/29/2021 INDICATION: 76 year old male with L3-L4 osteomyelitis discitis. He presents for disc aspiration. EXAM: Disc aspiration, lumbar MEDICATIONS: The patient is currently admitted to the hospital and receiving intravenous antibiotics. The antibiotics were administered within an appropriate time frame prior to the initiation of the procedure. ANESTHESIA/SEDATION: None. COMPLICATIONS: None immediate. PROCEDURE: Informed  written consent was obtained from the patient after a thorough discussion of the procedural risks, benefits and alternatives. All questions were addressed. Maximal Sterile Barrier Technique was utilized including caps, mask, sterile gowns, sterile gloves, sterile drape, hand hygiene and skin antiseptic. A timeout was performed prior to the initiation of the procedure. The L3-L4 disc space was identified fluoroscopically. An RPO angulation was selected. The skin was marked and prepped in standard sterile fashion using Betadine skin prep. Local anesthesia was attained by infiltration with 1% lidocaine. Under real-time fluoroscopy, a curved 22 gauge 15 cm Chiba needle was carefully advanced through the soft tissues and used to enter into the disc space. Aspiration was performed yielding approximately 0.5 mL of purulent fluid. This was sent for Gram stain and culture. IMPRESSION: Successful L3-L4 disc aspiration yielding 0.5 mL purulent fluid. Electronically Signed   By: Jacqulynn Cadet M.D.   On: 12/29/2021 16:54  US RENAL  Result Date: 12/29/2021 CLINICAL DATA:  Acute kidney injury EXAM: RENAL / URINARY TRACT ULTRASOUND COMPLETE COMPARISON:  None Available. FINDINGS: Right Kidney: Renal measurements: 8.5 x 5.2 x 5.2 cm = volume: 118 mL. Mild cortical thinning. Echogenicity within normal limits. No mass or hydronephrosis visualized. Left Kidney: Renal measurements: 9.2 x 5.1 x 5.0 cm = volume: 124 mL. Mild cortical thinning. Echogenicity within normal limits. No mass or hydronephrosis visualized. Bladder: Appears normal for degree of bladder distention. Other: None. IMPRESSION: 1. No acute abnormality.  No hydronephrosis. 2. Mild bilateral renal cortical thinning, in keeping with medical renal disease. Electronically Signed   By: Delanna Ahmadi M.D.   On: 12/29/2021 11:36   MR LUMBAR SPINE WO CONTRAST  Result Date: 12/26/2021 CLINICAL DATA:  Lower back pain. Symptoms persist with 6 weeks treatment. Bilateral  leg pain. EXAM: MRI LUMBAR SPINE WITHOUT CONTRAST TECHNIQUE: Multiplanar, multisequence MR imaging of the lumbar spine was performed. No intravenous contrast was administered. COMPARISON:  Lumbar spine radiographs 12/26/2021 FINDINGS: Segmentation: There is transitional lumbosacral anatomy. Correlating with prior 09/29/2021 CT there appear to be 12 rib-bearing thoracic type vertebral bodies. The next vertebral body is transitional with segmented transverse processes but no ribs. This is considered T13. Distal to this, the next 5 vertebral bodies are considered L1 through L5. Axial series 8 image 9 corresponds to the T13-L1 disc level, image 16 corresponds to the L1-2 disc level, and image 37 corresponds to the L5-S1 disc level. There is a transitional right L5-S1 assimilation joint with associated partially visualized degenerative change and marrow edema (sagittal series 7, image 1/17 and axial image 35/41). Alignment:  2 mm retrolisthesis of L1 on L2. Vertebrae: Vertebral body heights are maintained. Severe right-greater-than-left L3-4 diffuse severe L4-5, and moderate right-greater-than-left L2-3 and mild right-greater-than-left L1-2 disc space narrowing. Decreased disc hydration is greatest at L4-5. There is mild-to-moderate endplate irregularity of the right-greater-than-left L3-4 disc space with moderate marrow edema and mild chronic fat intensity marrow endplate degenerative changes at the right L3-4 disc space. There is mild fluid throughout the L3-4 disc space raising concern for discitis/osteomyelitis. Conus medullaris and cauda equina: Conus extends to the mid to inferior T12 level. Conus and cauda equina appear normal. Paraspinal and other soft tissues: At the area of the right L3-4 disc space concerning for discitis/osteomyelitis there is mild edema within the adjacent right psoas muscle (axial series 8 image 28 and sagittal series 7, image 2) further raising suspicion for infected disc. The kidneys are  partially visualized. There are multiple small decreased T1 and increased T2 signal cyst within the upper pole of the right kidney. Disc levels: T11-12 and T12-L1: Unremarkable. L1-2: Mild-to-moderate bilateral facet joint hypertrophy. Mild broad-based posterior disc osteophyte complex. Mild-to-moderate bilateral neuroforaminal stenosis. No central canal stenosis. L2-3: Mild-to-moderate bilateral facet joint hypertrophy. Mild broad-based posterior disc osteophyte complex. Mild narrowing of the bilateral neural foramina without true stenosis. No central canal stenosis. L3-4: Mild-to-moderate bilateral facet joint hypertrophy. Moderate broad-based posterior disc osteophyte complex with bilateral intraforaminal extension. Moderate right-greater-than-left neuroforaminal stenosis. Moderate narrowing of the lateral recesses. Prominent epidural fat around the thecal sac. Mild-to-moderate central canal stenosis. L4-5: Moderate left and mild right facet joint hypertrophy. Mild-to-moderate broad-based posterior disc osteophyte complex with left greater than right intraforaminal extension. Moderate to severe left and mild-to-moderate right neuroforaminal stenosis. Mild narrowing of the lateral recesses. Prominent epidural fat around the thecal sac. Overall mild left-greater-than-right central canal stenosis. L5-S1: Moderate left and mild right facet joint hypertrophy.  Mild broad-based posterior disc bulge. Mild left-greater-than-right neuroforaminal stenosis. No central canal stenosis. IMPRESSION: 1. Transitional lumbosacral anatomy. There are considered to be 13 thoracic vertebral bodies with segmental transverse processes but no ribs at "T13." Recommend correlation with this numbering nomenclature prior to any image guided invasive procedure. 2. Findings are suspicious for right-greater-than-left L3-4 discitis/osteomyelitis including right-greater-than-left endplate marrow edema, endplate irregularity, and fluid within the  disc space. No definite paraspinal or epidural abscess is seen. There does appear to be inflammatory edema within the adjacent right psoas muscle. 3. Multilevel degenerative disc and joint changes as above. Mild-to-moderate L3-4 and mild left-greater-than-right L4-5 central canal stenosis. 4. Multilevel neuroforaminal stenosis including moderate right-greater-than-left L3-4 and moderate to severe left and mild-to-moderate right L4-5 neuroforaminal stenosis. Critical Value/emergent results were called by telephone at the time of interpretation on 12/26/2021 at 5:58 pm to provider [facility] , who verbally acknowledged these results. Electronically Signed   By: Yvonne Kendall M.D.   On: 12/26/2021 17:58   DG Lumbar Spine Complete  Result Date: 12/26/2021 CLINICAL DATA:  Back pain for 1 week, no known injury EXAM: LUMBAR SPINE - COMPLETE 4+ VIEW COMPARISON:  None Available. FINDINGS: No fracture or dislocation of the lumbar spine. Moderate to severe disc space height loss and osteophytosis of the lower lumbar levels, worst from L3 through S1. Otherwise mild disc space height loss and osteophytosis. Moderate to severe facet degenerative change at the lower lumbar levels. Nonobstructive pattern of overlying bowel gas. IMPRESSION: 1.  No fracture or dislocation of the lumbar spine. 2. Moderate to severe disc and facet degenerative disease of the lower lumbar levels, worst from L3 through S1. Lumbar disc and neural foraminal pathology may be further evaluated by MRI if indicated by neurologically localizing signs and symptoms. Electronically Signed   By: Delanna Ahmadi M.D.   On: 12/26/2021 14:20   ECHOCARDIOGRAM COMPLETE  Result Date: 12/22/2021    ECHOCARDIOGRAM REPORT   Patient Name:   [name] [facility] Date of Exam: 12/22/2021 Medical Rec #:  [id number]                Height:       66.0 in Accession #:    [id number]               Weight:       198.6 lb Date of Birth:  [birth year]               BSA:          1.994  m Patient Age:    76 years                 BP:           137/70 mmHg Patient Gender: M                        HR:           65 bpm. Exam Location:  [facility] Procedure: 2D Echo, Cardiac Doppler and Color Doppler Indications:    Z98.890 s/p mitral valve clip  History:        Patient has prior history of Echocardiogram examinations, most                 recent 11/21/2021. CHF, CAD and NSTEMI, COPD and CKD,                 Arrythmias:Atrial Flutter, Signs/Symptoms:Dyspnea; Risk  Factors:Hypertension and Current Smoker. Clip1 XTW clip located                 at A2-P2.  Sonographer:    Marygrace Drought RCS Referring Phys: [place]  1. Left ventricular ejection fraction, by estimation, is 55 to 60%. The left ventricle has normal function. The left ventricle has no regional wall motion abnormalities. The left ventricular internal cavity size was mildly to moderately dilated. There is mild left ventricular hypertrophy. Left ventricular diastolic parameters are indeterminate.  2. Right ventricular systolic function is normal. The right ventricular size is normal. There is normal pulmonary artery systolic pressure.  3. Left atrial size was mildly dilated.  4. Mitral valve clip in place . [name] The mitral valve has been repaired/replaced. Trivial mitral valve regurgitation. Procedure Date: 11/20/2021.  5. The aortic valve is calcified. Aortic valve regurgitation is moderate.  6. Aortic dilatation noted. There is moderate dilatation of the ascending aorta, measuring 50 mm. FINDINGS  Left Ventricle: Left ventricular ejection fraction, by estimation, is 55 to 60%. The left ventricle has normal function. The left ventricle has no regional wall motion abnormalities. The left ventricular internal cavity size was mildly to moderately dilated. There is mild left ventricular hypertrophy. Left ventricular diastolic parameters are indeterminate. Right Ventricle: The right ventricular size is normal. No  increase in right ventricular wall thickness. Right ventricular systolic function is normal. There is normal pulmonary artery systolic pressure. The tricuspid regurgitant velocity is 2.57 m/s, and  with an assumed right atrial pressure of 3 mmHg, the estimated right ventricular systolic pressure is 96.7 mmHg. Left Atrium: Left atrial size was mildly dilated. Right Atrium: Right atrial size was normal in size. Pericardium: There is no evidence of pericardial effusion. Mitral Valve: Mitral valve clip in place. The mitral valve has been repaired/replaced. Trivial mitral valve regurgitation. There is a Mitra-Clip present in the mitral position. MV peak gradient, 9.9 mmHg. The mean mitral valve gradient is 3.3 mmHg. Tricuspid Valve: The tricuspid valve is normal in structure. Tricuspid valve regurgitation is trivial. Aortic Valve: The aortic valve is calcified. Aortic valve regurgitation is moderate. Aortic regurgitation PHT measures 492 msec. Aortic valve mean gradient measures 11.0 mmHg. Aortic valve peak gradient measures 17.5 mmHg. Aortic valve area, by VTI measures 1.72 cm. Pulmonic Valve: The pulmonic valve was not well visualized. Pulmonic valve regurgitation is not visualized. Aorta: Aortic dilatation noted. There is moderate dilatation of the ascending aorta, measuring 50 mm. IAS/Shunts: The interatrial septum was not well visualized.  LEFT VENTRICLE PLAX 2D LVIDd:         5.30 cm   Diastology LVIDs:         3.80 cm   LV e' medial:    5.11 cm/s LV PW:         1.20 cm   LV E/e' medial:  14.5 LV IVS:        1.20 cm   LV e' lateral:   9.79 cm/s LVOT diam:     2.00 cm   LV E/e' lateral: 7.5 LV SV:         78 LV SV Index:   39 LVOT Area:     3.14 cm  RIGHT VENTRICLE RV Basal diam:  3.30 cm RV S prime:     12.80 cm/s TAPSE (M-mode): 2.6 cm RVSP:           29.4 mmHg LEFT ATRIUM  Index        RIGHT ATRIUM           Index LA diam:        4.40 cm 2.21 cm/m   RA Pressure: 3.00 mmHg LA Vol (A2C):   84.4 ml  42.33 ml/m  RA Area:     15.30 cm LA Vol (A4C):   54.0 ml 27.08 ml/m  RA Volume:   33.80 ml  16.95 ml/m LA Biplane Vol: 66.8 ml 33.50 ml/m  AORTIC VALVE AV Area (Vmax):    1.89 cm AV Area (Vmean):   1.85 cm AV Area (VTI):     1.72 cm AV Vmax:           209.00 cm/s AV Vmean:          156.000 cm/s AV VTI:            0.450 m AV Peak Grad:      17.5 mmHg AV Mean Grad:      11.0 mmHg LVOT Vmax:         126.00 cm/s LVOT Vmean:        91.800 cm/s LVOT VTI:          0.247 m LVOT/AV VTI ratio: 0.55 AI PHT:            492 msec  AORTA Ao Root diam: 3.40 cm Ao Asc diam:  4.70 cm MITRAL VALVE                TRICUSPID VALVE MV Area (PHT): 2.44 cm     TR Peak grad:   26.4 mmHg MV Area VTI:   1.29 cm     TR Vmax:        257.00 cm/s MV Peak grad:  9.9 mmHg     Estimated RAP:  3.00 mmHg MV Mean grad:  3.3 mmHg     RVSP:           29.4 mmHg MV Vmax:       1.58 m/s MV Vmean:      78.5 cm/s    SHUNTS MV Decel Time: 311 msec     Systemic VTI:  0.25 m MV E velocity: 73.90 cm/s   Systemic Diam: 2.00 cm MV A velocity: 147.00 cm/s MV E/A ratio:  0.50 Mertie Moores MD Electronically signed by Mertie Moores MD Signature Date/Time: 12/22/2021/3:57:16 PM    Final     Microbiology: Results for orders placed or performed during the hospital encounter of 12/26/21  MRSA Next Gen by PCR, Nasal     Status: Abnormal   Collection Time: 12/26/21 10:26 PM   Specimen: Nasal Mucosa; Nasal Swab  Result Value Ref Range Status   MRSA by PCR Next Gen DETECTED (A) NOT DETECTED Final    Comment: RESULT CALLED TO, READ BACK BY AND VERIFIED WITH: Lear Ng, RN 12/27/2021 @ 48 [facility] (NOTE) The GeneXpert MRSA Assay (FDA approved for NASAL specimens only), is one component of a comprehensive MRSA colonization surveillance program. It is not intended to diagnose MRSA infection nor to guide or monitor treatment for MRSA infections. Test performance is not FDA approved in patients less than 27 years old. Performed at [facility], [place] [address], [place], [place] [address]   Culture, blood (Routine X 2) w Reflex to ID Panel     Status: None   Collection Time: 12/27/21 12:55 AM   Specimen: BLOOD  Result Value Ref Range Status   Specimen Description  Final    BLOOD BLOOD RIGHT HAND Performed at [facility] [address], [place], [place] [address]    Special Requests   Final    BOTTLES DRAWN AEROBIC ONLY Blood Culture results may not be optimal due to an inadequate volume of blood received in culture bottles Performed at [facility] [address], [place], [place] [address]    Culture   Final    NO GROWTH 5 DAYS Performed at [facility], [place] [address] [place], [place] [address]    Report Status 01/01/2022 FINAL  Final  Culture, blood (Routine X 2) w Reflex to ID Panel     Status: None   Collection Time: 12/27/21 12:55 AM   Specimen: BLOOD  Result Value Ref Range Status   Specimen Description   Final    BLOOD RIGHT ANTECUBITAL Performed at [facility] [address] [place], [place] [address]    Special Requests   Final    BOTTLES DRAWN AEROBIC ONLY Blood Culture adequate volume Performed at [facility] [address], [place], [place] [address]    Culture   Final    NO GROWTH 5 DAYS Performed at [facility], [place] [address] [place], [place] [address]    Report Status 01/01/2022 FINAL  Final  Culture, blood (Routine X 2) w Reflex to ID Panel     Status: None   Collection Time: 12/29/21  8:04 AM   Specimen: BLOOD  Result Value Ref Range Status   Specimen Description   Final    BLOOD RIGHT ANTECUBITAL Performed at [facility] [address], [place], [place] [address]    Special Requests   Final    IN PEDIATRIC BOTTLE Blood Culture results may not be optimal due to an inadequate volume of blood received in culture bottles Performed at [facility] [address] [place], [place] [address]    Culture   Final    NO GROWTH 5 DAYS Performed at [facility], [place] [address] [place], [place] [address]    Report Status 01/03/2022 FINAL  Final  Culture, blood (Routine X 2) w Reflex to ID Panel     Status: None   Collection Time: 12/29/21  8:10 AM   Specimen: BLOOD  Result Value Ref Range Status   Specimen Description   Final    BLOOD BLOOD LEFT HAND Performed at [facility] [address], [place], [place] [address]    Special Requests   Final    BOTTLES DRAWN AEROBIC ONLY Blood Culture results may not be optimal due to an inadequate volume of blood received in culture bottles Performed at [facility] [address] [place], [place] [address]    Culture   Final    NO GROWTH 5 DAYS Performed at [facility], [place] [address] [place], [place] [address]    Report Status 01/03/2022 FINAL  Final  Urine Culture     Status: None   Collection Time: 12/29/21  8:44 AM   Specimen: Urine, Clean Catch  Result Value Ref Range Status   Specimen Description   Final    URINE, CLEAN CATCH Performed at [facility], [place] [address], [place], [place] [address]    Special Requests   Final    NONE Performed at [facility], [place] [address], [place],  [address]  Culture   Final    NO GROWTH Performed at [facility], [place] [address], [place], [place] [address]    Report Status 12/30/2021 FINAL  Final  Aerobic/Anaerobic Culture w Gram Stain (surgical/deep wound)     Status: None   Collection Time: 12/29/21  4:34 PM   Specimen: Wound; Abscess  Result Value Ref Range Status   Specimen Description   Final    WOUND Performed at [facility] [address], [place], [place] [address]    Special Requests   Final    ABSCESS Performed at [facility], [place] [address], [place], [place] [address]    Gram  Stain   Final    MODERATE WBC PRESENT, PREDOMINANTLY PMN NO ORGANISMS SEEN    Culture   Final    No growth aerobically or anaerobically. Performed at [facility], [place] [address] [place], [place] [address]    Report Status 01/03/2022 FINAL  Final    Labs: CBC: Recent Labs  Lab 01/01/22 0510 01/02/22 0456 01/03/22 0537 01/04/22 0524 01/05/22 0405  WBC 11.6* 12.6* 11.9* 12.8* 11.6*  HGB 11.7* 13.4 12.3* 12.5* 12.3*  HCT 37.8* 43.4 40.2 40.7 40.4  MCV 99.0 99.5 99.3 100.5* 99.3  PLT 220 273 274 270 315   Basic Metabolic Panel: Recent Labs  Lab 01/01/22 0510 01/02/22 0456 01/03/22 1014 01/04/22 0524 01/05/22 0405  NA 143 144 143 144 143  K 3.9 4.0 4.5 4.5 4.9  CL 113* 112* 115* 112* 113*  CO2 23 24 21* 23 25  GLUCOSE 123* 121* 143* 98 105*  BUN 74* 62* 54* 48* 47*  CREATININE 2.18* 1.66* 1.34* 1.31* 1.25*  CALCIUM 8.0* 8.4* 8.7* 8.6* 8.7*   Liver Function Tests: Recent Labs  Lab 01/04/22 0524  AST 33  ALT 30  ALKPHOS 126  BILITOT 0.9  PROT 6.2*  ALBUMIN 2.2*   CBG: No results for input(s): "GLUCAP" in the last 168 hours.  Discharge time spent: greater than 30 minutes.  Signed: Elmarie Shiley, MD [facility] 01/05/2022

## 2022-01-05 NOTE — Progress Notes (Addendum)
The patient returned from procedure. No change from am assessment. Dressing central venous cathter right chest clean dry intact. Line place per IR.

## 2022-01-05 NOTE — Progress Notes (Signed)
Physical Therapy Treatment Patient Details Name: [name] MRN: [id number] DOB: [birth year] Today's Date: 01/05/2022   History of Present Illness Pt is a 76yo male presenting to [facility] ED on 12/26/21 with back pain and decreased ability to ambulate. MRI reveleaed R>L L3-L4 discitis  6/12 lumbar disc aspiration L3-4  PMH: afib, CHF, COPD, HTN, GERD, gout, substance abuse, syncope, CAD/NSTEMI with multiple cardiac surgeries, MDS, CKDIII, polycythemia.    PT Comments    Pt received supine in bed in good spirits, reporting "15/10" pain but agreeable to mobilize. Pt required mod assist +2 for bed mobility and was able to sit EOB for ~80mn without assistance while RN provided medications. Pt required use of STEDY for sit to stand transfer and to transfer to recliner, completed sit to stand with min assist +2 and use of BUE on STEDY, stand to sit to recliner surface min guard, required mod assist +2 to scoot hips back in recliner via bed pad. Pt completed low-rep exercises demonstrating increased active range of motion in BLE, encouraged pt to complete additional repetitions in ~353m once pain medication is within therapeutic window. Reinforced previous education of moving extremities and neck through painful range of motion to clear uric acid crystals, pt verbalized understanding, wrote exercises on whiteboard. Discharge destination remains appropriate, we will continue to follow acutely.    Recommendations for follow up therapy are one component of a multi-disciplinary discharge planning process, led by the attending physician.  Recommendations may be updated based on patient status, additional functional criteria and insurance authorization.  Follow Up Recommendations  Skilled nursing-short term rehab (<3 hours/day)     Assistance Recommended at Discharge Frequent or constant Supervision/Assistance  Patient can return home with the following Two people to help with walking and/or  transfers;Two people to help with bathing/dressing/bathroom;Assistance with cooking/housework;Assistance with feeding;Assist for transportation;Help with stairs or ramp for entrance   Equipment Recommendations  Other (comment) (TBD)    Recommendations for Other Services       Precautions / Restrictions Precautions Precautions: Fall Precaution Comments: Recent insertion of R venous catheter Restrictions Weight Bearing Restrictions: No     Mobility  Bed Mobility Overal bed mobility: Needs Assistance Bed Mobility: Supine to Sit     Supine to sit: Mod assist, +2 for physical assistance, HOB elevated, +2 for safety/equipment     General bed mobility comments: Pt required mod assist +2 for bringing BLE off bed and elevating trunk secondary to pain, pt crying out. Pt able to sit EOB without assist.    Transfers Overall transfer level: Needs assistance Equipment used: Ambulation equipment used (SCharlaine DaltonTransfers: Sit to/from Stand, Bed to chair/wheelchair/BSC Sit to Stand: From elevated surface, +2 physical assistance, +2 safety/equipment, Min assist   Step pivot transfers: +2 physical assistance, +2 safety/equipment, From elevated surface, Min assist       General transfer comment: Sit to stand (x3 with STEDY): pt used BUE to pull up with min assist +2 for physical assist and safety. Pt with min guard during movement of stedy in room. Stand to sit (STEDY): min guard at most, pt lowered down using BUE. Pt required mod assist +2 to scoot hips back in recliner for comfort. Transfer via Lift Equipment: Stedy  Ambulation/Gait               General Gait Details: deferred   Stairs             Wheelchair Mobility    Modified Rankin (Stroke Patients Only)  Balance Overall balance assessment: Needs assistance Sitting-balance support: No upper extremity supported, Feet supported Sitting balance-Leahy Scale: Fair       Standing balance-Leahy Scale: Poor                               Cognition Arousal/Alertness: Awake/alert Behavior During Therapy: WFL for tasks assessed/performed Overall Cognitive Status: Within Functional Limits for tasks assessed                                          Exercises General Exercises - Lower Extremity Ankle Circles/Pumps: AROM, Both, 10 reps, Seated Heel Slides: AROM, 5 reps, Both, Seated (Break between sides)    General Comments        Pertinent Vitals/Pain Pain Assessment Pain Assessment: 0-10 Pain Score: 10-Worst pain ever Pain Location: Back, all extremity joints, neck Pain Descriptors / Indicators: Crying, Discomfort, Grimacing, Moaning, Shooting Pain Intervention(s): Limited activity within patient's tolerance, Monitored during session, Repositioned, RN gave pain meds during session    Home Living                          Prior Function            PT Goals (current goals can now be found in the care plan section) Acute Rehab PT Goals Patient Stated Goal: Reduce pain PT Goal Formulation: With patient Time For Goal Achievement: 01/15/22 Potential to Achieve Goals: Fair Progress towards PT goals: Progressing toward goals    Frequency    Min 3X/week      PT Plan Current plan remains appropriate    Co-evaluation              AM-PAC PT "6 Clicks" Mobility   Outcome Measure  Help needed turning from your back to your side while in a flat bed without using bedrails?: A Lot Help needed moving from lying on your back to sitting on the side of a flat bed without using bedrails?: A Lot Help needed moving to and from a bed to a chair (including a wheelchair)?: A Lot Help needed standing up from a chair using your arms (e.g., wheelchair or bedside chair)?: A Lot Help needed to walk in hospital room?: Total Help needed climbing 3-5 steps with a railing? : Total 6 Click Score: 10    End of Session Equipment Utilized During Treatment: Gait  belt Charlaine Dalton) Activity Tolerance: Patient limited by pain Patient left: with call bell/phone within reach;in chair;with chair alarm set;with nursing/sitter in room Nurse Communication: Mobility status PT Visit Diagnosis: Pain;Difficulty in walking, not elsewhere classified (R26.2) Pain - Right/Left:  (bilateral) Pain - part of body: Shoulder;Arm;Hand;Hip;Knee;Leg;Ankle and joints of foot (and low back)     Time: 7412-8786 PT Time Calculation (min) (ACUTE ONLY): 21 min  Charges:  $Therapeutic Activity: 8-22 mins                     Coolidge Breeze, PT, DPT [facility] Department Office: [phone] Pager: [phone]   Coolidge Breeze 01/05/2022, 11:02 AM

## 2022-01-05 NOTE — Progress Notes (Addendum)
PTAR arrived. No changes from am assessment. Pt is alert, oriented x4, out of bed 2 assist with steady. Pt to discharge with foley and central venous cathter. RN attempted to call report at [place]. No answer. [phone].

## 2022-01-05 NOTE — Progress Notes (Signed)
RN attempted to call report, Nurse not available. Awaiting return call [phone].

## 2022-01-05 NOTE — Progress Notes (Signed)
Physical Therapy Treatment Patient Details Name: [name] MRN: [id number] DOB: January 16, 1946 Today's Date: 01/05/2022   History of Present Illness Pt is a 76yo male presenting to [facility] ED on 12/26/21 with back pain and decreased ability to ambulate. MRI reveleaed R>L L3-L4 discitis  6/12 lumbar disc aspiration L3-4  PMH: afib, CHF, COPD, HTN, GERD, gout, substance abuse, syncope, CAD/NSTEMI with multiple cardiac surgeries, MDS, CKDIII, polycythemia.    PT Comments    RN requesting assistance to transfer pt from recliner to transport stretcher via [place]. Pt required mod assist +2 for transfers with max multimodal cuing for form. Pt groaning and crying out in pain, rated it 10/10. Pt safely positioned on stretcher with help of transport team and RN. Pt's education completed and has no further questions PT will sign off, thank you for this referral.     Recommendations for follow up therapy are one component of a multi-disciplinary discharge planning process, led by the attending physician.  Recommendations may be updated based on patient status, additional functional criteria and insurance authorization.  Follow Up Recommendations  Skilled nursing-short term rehab (<3 hours/day)     Assistance Recommended at Discharge Frequent or constant Supervision/Assistance  Patient can return home with the following Two people to help with walking and/or transfers;Two people to help with bathing/dressing/bathroom;Assistance with cooking/housework;Assistance with feeding;Assist for transportation;Help with stairs or ramp for entrance   Equipment Recommendations  Other (comment) (TBD)    Recommendations for Other Services       Precautions / Restrictions Precautions Precautions: Fall Precaution Comments: Recent insertion of R venous catheter Restrictions Weight Bearing Restrictions: No     Mobility  Bed Mobility Overal bed mobility: Needs Assistance Bed Mobility: Supine to Sit      Supine to sit: Mod assist, +2 for physical assistance, HOB elevated, +2 for safety/equipment     General bed mobility comments: Pt in recliner at entry and stretcher at exit    Transfers Overall transfer level: Needs assistance Equipment used: Ambulation equipment used [employer]) Transfers: Sit to/from Stand, Bed to chair/wheelchair/BSC Sit to Stand: +2 physical assistance, +2 safety/equipment, Mod assist   Step pivot transfers: +2 physical assistance, +2 safety/equipment, From elevated surface, Min assist       General transfer comment: Pt completed sit to stand from recliner to [place] with Mod assist +2 for lift assist, max multimodal cues for standing up tall, required mod assist to bring pads under bilateral buttocks through weight shifts. Pt min guard for wheeling stedy to stretcher, min guard +2 for stand to sit transfer to stretcher from [place]. Transfer via Lift Equipment: Stedy  Ambulation/Gait               General Gait Details: deferred   Stairs             Wheelchair Mobility    Modified Rankin (Stroke Patients Only)       Balance Overall balance assessment: Needs assistance Sitting-balance support: No upper extremity supported, Feet supported Sitting balance-Leahy Scale: Fair       Standing balance-Leahy Scale: Poor                              Cognition Arousal/Alertness: Awake/alert Behavior During Therapy: WFL for tasks assessed/performed Overall Cognitive Status: Within Functional Limits for tasks assessed  Exercises General Exercises - Lower Extremity Ankle Circles/Pumps: AROM, Both, 10 reps, Seated Heel Slides:  (Break between sides)    General Comments        Pertinent Vitals/Pain Pain Assessment Pain Assessment: 0-10 Pain Score: 10-Worst pain ever Faces Pain Scale: Hurts worst Pain Location: Back, all extremity joints, neck Pain Descriptors / Indicators:  Crying, Discomfort, Grimacing, Moaning, Shooting Pain Intervention(s): Limited activity within patient's tolerance, Monitored during session, Repositioned, RN gave pain meds during session    [place] expects to be discharged to:: Private residence Living Arrangements: Spouse/significant other;Other relatives (2 grandchildren (teenagers)) Available Help at Discharge: Family;Available 24 hours/day Type of Home: House Home Access: Stairs to enter Entrance Stairs-Rails: Right;Left;Can reach both Entrance Stairs-Number of Steps: 3   Home Layout: One level Home Equipment: Cane - single point Additional Comments: pt reports use of cane at baseline;pt lives with his "better half" and her 2 grandchildren who are 18y.o and 16y.o    Prior Function            PT Goals (current goals can now be found in the care plan section) Acute Rehab PT Goals Patient Stated Goal: Reduce pain PT Goal Formulation: With patient Time For Goal Achievement: 01/15/22 Potential to Achieve Goals: Fair Progress towards PT goals: Progressing toward goals    Frequency    Min 3X/week      PT Plan Current plan remains appropriate    Co-evaluation              AM-PAC PT "6 Clicks" Mobility   Outcome Measure  Help needed turning from your back to your side while in a flat bed without using bedrails?: A Lot Help needed moving from lying on your back to sitting on the side of a flat bed without using bedrails?: A Lot Help needed moving to and from a bed to a chair (including a wheelchair)?: A Lot Help needed standing up from a chair using your arms (e.g., wheelchair or bedside chair)?: A Lot Help needed to walk in hospital room?: Total Help needed climbing 3-5 steps with a railing? : Total 6 Click Score: 10    End of Session Equipment Utilized During Treatment: Gait belt Charlaine Dalton) Activity Tolerance: Patient limited by pain Patient left:  (on transfer stretcher) Nurse Communication:  Mobility status PT Visit Diagnosis: Pain;Difficulty in walking, not elsewhere classified (R26.2) Pain - Right/Left:  (bilateral) Pain - part of body: Shoulder;Arm;Hand;Hip;Knee;Leg;Ankle and joints of foot (and low back)     Time: 7902-4097 PT Time Calculation (min) (ACUTE ONLY): 14 min  Charges:  $Therapeutic Activity: 8-22 mins                     Coolidge Breeze, PT, DPT [facility] Department Office: [phone] Pager: [phone]   Coolidge Breeze 01/05/2022, 5:48 PM

## 2022-01-05 NOTE — Care Management Important Message (Signed)
Important Message  Patient Details IM Letter placed in Patients room. Name: [name] MRN: [id number] Date of Birth: 08-Jul-1946   Medicare Important Message Given:  Yes     Kerin Salen 01/05/2022, 1:33 PM

## 2022-01-05 NOTE — Progress Notes (Signed)
Pt was transported via PTAR. Pt was stable at time of discharge. Report called to [facility] denied questions or concerns related to admission.

## 2022-01-05 NOTE — TOC Progression Note (Addendum)
Transition of Care [facility]) - Progression Note    Patient Details  Name: [name] MRN: [id number] Date of Birth: 1946/05/30  Transition of [facility]) CM/SW Contact  Emaad Nanna, Juliann Pulse, RN Phone Number: 01/05/2022, 12:17 PM  Clinical Narrative: await choice for SNF. Iv abx till 7/20.  -2p-facility chosen-[facility]-rep Christine  -2P-Goin gto [employer] rm#[id number],report tel#[phone]. PTAR called.    Expected Discharge Plan: Skilled Nursing Facility Barriers to Discharge: Continued Medical Work up  Expected Discharge Plan and Services Expected Discharge Plan: [place]   Discharge Planning Services: CM Consult   Living arrangements for the past 2 months: Single Family Home Expected Discharge Date: 01/05/22                                     Social Determinants of Health (SDOH) Interventions    Readmission Risk Interventions    12/29/2021    3:08 PM 08/24/2021   10:25 AM 05/17/2019    1:32 PM  Readmission Risk Prevention Plan  Transportation Screening Complete Complete Complete  PCP or Specialist Appt within 3-5 Days Complete Complete   HRI or [place] Complete Complete   Social Work Consult for [place] Planning/Counseling Complete Complete   Palliative Care Screening Not Applicable Not Applicable   Medication Review Press photographer) Complete Complete Complete  PCP or Specialist appointment within 3-5 days of discharge   Patient refused  [place] or [place]   Patient refused  [place]   Not Applicable

## 2022-01-06 ENCOUNTER — Other Ambulatory Visit (HOSPITAL_COMMUNITY): Payer: Self-pay | Admitting: Internal Medicine

## 2022-01-06 DIAGNOSIS — B999 Unspecified infectious disease: Secondary | ICD-10-CM

## 2022-01-07 ENCOUNTER — Other Ambulatory Visit (HOSPITAL_COMMUNITY): Payer: Self-pay | Admitting: Internal Medicine

## 2022-01-09 ENCOUNTER — Other Ambulatory Visit (HOSPITAL_COMMUNITY): Payer: Self-pay

## 2022-01-10 ENCOUNTER — Encounter: Payer: Self-pay | Admitting: *Deleted

## 2022-01-14 ENCOUNTER — Inpatient Hospital Stay: Payer: Medicare Other | Admitting: Internal Medicine

## 2022-01-16 ENCOUNTER — Encounter: Payer: Self-pay | Admitting: Internal Medicine

## 2022-01-16 ENCOUNTER — Other Ambulatory Visit: Payer: Self-pay

## 2022-01-16 ENCOUNTER — Ambulatory Visit (INDEPENDENT_AMBULATORY_CARE_PROVIDER_SITE_OTHER): Payer: Medicare Other | Admitting: Internal Medicine

## 2022-01-16 VITALS — BP 156/50 | HR 59 | Temp 97.9°F

## 2022-01-16 DIAGNOSIS — Z9861 Coronary angioplasty status: Secondary | ICD-10-CM

## 2022-01-16 DIAGNOSIS — M4646 Discitis, unspecified, lumbar region: Secondary | ICD-10-CM

## 2022-01-16 DIAGNOSIS — Z5181 Encounter for therapeutic drug level monitoring: Secondary | ICD-10-CM

## 2022-01-16 DIAGNOSIS — I2510 Atherosclerotic heart disease of native coronary artery without angina pectoris: Secondary | ICD-10-CM

## 2022-01-16 DIAGNOSIS — Z9889 Other specified postprocedural states: Secondary | ICD-10-CM | POA: Diagnosis not present

## 2022-01-16 NOTE — Assessment & Plan Note (Signed)
I have requested a copy of his lab from the facility.

## 2022-01-16 NOTE — Assessment & Plan Note (Signed)
He will need IR to have this removed and can be done after his last dose 7/20.

## 2022-01-16 NOTE — Progress Notes (Signed)
   Subjective:    Patient ID: [name], male    DOB: 01-Aug-1945, 76 y.o.   MRN: [id number]  HPI Here for hospital follow up He presented with back pain progressive over weeks and found to have lumbar discitis/osteomyelits on an MRI.  He was started on empiric treatment with daptomycin and ceftriaxone after a disc aspiration.  Culture remained negative.  He developed myalgias on daptomycin and has underlying renal disease so was changed to ceftaroline.  Plan for 6 weeks through July 20.  Here from his facility and has a tunneled catheter which is working well. No fever, no chills.  Back pain is about the same.  Getting PT where he is.  No diarrhea or rash.    Review of Systems  Constitutional:  Negative for chills and fever.  Gastrointestinal:  Negative for diarrhea and nausea.  Skin:  Negative for rash.       Objective:   Physical Exam Eyes:     General: No scleral icterus. Pulmonary:     Effort: Pulmonary effort is normal.  Musculoskeletal:     Right lower leg: No edema.     Left lower leg: No edema.     Comments: No tenderness over thoracic or lumbar spine  Neurological:     Mental Status: He is alert.   SH: living in a facility        Assessment & Plan:

## 2022-01-16 NOTE — Assessment & Plan Note (Signed)
He is doing well on treatment and back is stable, though he reports pain level is about the same.  I discussed with him that he may continue to have residual pain despite adequate treatment.   Plan will remain the same with 6 weeks through July 20 He will stop after that.  He can return here as needed for new/worsening symptoms.

## 2022-01-22 ENCOUNTER — Inpatient Hospital Stay (HOSPITAL_COMMUNITY)
Admission: EM | Admit: 2022-01-22 | Discharge: 2022-02-17 | DRG: 871 | Disposition: E | Payer: Medicare Other | Attending: Internal Medicine | Admitting: Internal Medicine

## 2022-01-22 ENCOUNTER — Other Ambulatory Visit: Payer: Self-pay

## 2022-01-22 ENCOUNTER — Inpatient Hospital Stay (HOSPITAL_COMMUNITY): Payer: Medicare Other

## 2022-01-22 ENCOUNTER — Emergency Department (HOSPITAL_COMMUNITY): Payer: Medicare Other

## 2022-01-22 ENCOUNTER — Encounter (HOSPITAL_COMMUNITY): Payer: Self-pay | Admitting: Emergency Medicine

## 2022-01-22 DIAGNOSIS — M4646 Discitis, unspecified, lumbar region: Secondary | ICD-10-CM | POA: Diagnosis not present

## 2022-01-22 DIAGNOSIS — Z6832 Body mass index (BMI) 32.0-32.9, adult: Secondary | ICD-10-CM

## 2022-01-22 DIAGNOSIS — I480 Paroxysmal atrial fibrillation: Secondary | ICD-10-CM | POA: Diagnosis present

## 2022-01-22 DIAGNOSIS — R0603 Acute respiratory distress: Principal | ICD-10-CM

## 2022-01-22 DIAGNOSIS — L89322 Pressure ulcer of left buttock, stage 2: Secondary | ICD-10-CM | POA: Diagnosis present

## 2022-01-22 DIAGNOSIS — F17210 Nicotine dependence, cigarettes, uncomplicated: Secondary | ICD-10-CM | POA: Diagnosis present

## 2022-01-22 DIAGNOSIS — K761 Chronic passive congestion of liver: Secondary | ICD-10-CM | POA: Diagnosis present

## 2022-01-22 DIAGNOSIS — I468 Cardiac arrest due to other underlying condition: Secondary | ICD-10-CM | POA: Diagnosis not present

## 2022-01-22 DIAGNOSIS — R791 Abnormal coagulation profile: Secondary | ICD-10-CM | POA: Diagnosis present

## 2022-01-22 DIAGNOSIS — D631 Anemia in chronic kidney disease: Secondary | ICD-10-CM | POA: Diagnosis present

## 2022-01-22 DIAGNOSIS — N1832 Chronic kidney disease, stage 3b: Secondary | ICD-10-CM | POA: Diagnosis present

## 2022-01-22 DIAGNOSIS — L89626 Pressure-induced deep tissue damage of left heel: Secondary | ICD-10-CM | POA: Diagnosis present

## 2022-01-22 DIAGNOSIS — Z952 Presence of prosthetic heart valve: Secondary | ICD-10-CM | POA: Diagnosis not present

## 2022-01-22 DIAGNOSIS — I77819 Aortic ectasia, unspecified site: Secondary | ICD-10-CM | POA: Diagnosis present

## 2022-01-22 DIAGNOSIS — A419 Sepsis, unspecified organism: Secondary | ICD-10-CM | POA: Diagnosis present

## 2022-01-22 DIAGNOSIS — N390 Urinary tract infection, site not specified: Secondary | ICD-10-CM | POA: Diagnosis present

## 2022-01-22 DIAGNOSIS — J159 Unspecified bacterial pneumonia: Secondary | ICD-10-CM | POA: Diagnosis present

## 2022-01-22 DIAGNOSIS — J9601 Acute respiratory failure with hypoxia: Secondary | ICD-10-CM | POA: Diagnosis present

## 2022-01-22 DIAGNOSIS — F1010 Alcohol abuse, uncomplicated: Secondary | ICD-10-CM | POA: Diagnosis present

## 2022-01-22 DIAGNOSIS — I493 Ventricular premature depolarization: Secondary | ICD-10-CM | POA: Diagnosis present

## 2022-01-22 DIAGNOSIS — I5042 Chronic combined systolic (congestive) and diastolic (congestive) heart failure: Secondary | ICD-10-CM | POA: Diagnosis present

## 2022-01-22 DIAGNOSIS — Z781 Physical restraint status: Secondary | ICD-10-CM

## 2022-01-22 DIAGNOSIS — M4626 Osteomyelitis of vertebra, lumbar region: Secondary | ICD-10-CM | POA: Diagnosis present

## 2022-01-22 DIAGNOSIS — N179 Acute kidney failure, unspecified: Secondary | ICD-10-CM | POA: Diagnosis present

## 2022-01-22 DIAGNOSIS — Z8249 Family history of ischemic heart disease and other diseases of the circulatory system: Secondary | ICD-10-CM

## 2022-01-22 DIAGNOSIS — R6521 Severe sepsis with septic shock: Secondary | ICD-10-CM | POA: Diagnosis present

## 2022-01-22 DIAGNOSIS — I130 Hypertensive heart and chronic kidney disease with heart failure and stage 1 through stage 4 chronic kidney disease, or unspecified chronic kidney disease: Secondary | ICD-10-CM | POA: Diagnosis present

## 2022-01-22 DIAGNOSIS — I429 Cardiomyopathy, unspecified: Secondary | ICD-10-CM | POA: Diagnosis present

## 2022-01-22 DIAGNOSIS — Z7951 Long term (current) use of inhaled steroids: Secondary | ICD-10-CM

## 2022-01-22 DIAGNOSIS — J189 Pneumonia, unspecified organism: Secondary | ICD-10-CM

## 2022-01-22 DIAGNOSIS — J440 Chronic obstructive pulmonary disease with acute lower respiratory infection: Secondary | ICD-10-CM | POA: Diagnosis present

## 2022-01-22 DIAGNOSIS — L89312 Pressure ulcer of right buttock, stage 2: Secondary | ICD-10-CM | POA: Diagnosis present

## 2022-01-22 DIAGNOSIS — I252 Old myocardial infarction: Secondary | ICD-10-CM

## 2022-01-22 DIAGNOSIS — R197 Diarrhea, unspecified: Secondary | ICD-10-CM | POA: Diagnosis not present

## 2022-01-22 DIAGNOSIS — G9341 Metabolic encephalopathy: Secondary | ICD-10-CM | POA: Diagnosis present

## 2022-01-22 DIAGNOSIS — Y95 Nosocomial condition: Secondary | ICD-10-CM | POA: Diagnosis present

## 2022-01-22 DIAGNOSIS — Z79891 Long term (current) use of opiate analgesic: Secondary | ICD-10-CM

## 2022-01-22 DIAGNOSIS — I4892 Unspecified atrial flutter: Secondary | ICD-10-CM | POA: Diagnosis present

## 2022-01-22 DIAGNOSIS — Z79899 Other long term (current) drug therapy: Secondary | ICD-10-CM

## 2022-01-22 DIAGNOSIS — Z7901 Long term (current) use of anticoagulants: Secondary | ICD-10-CM

## 2022-01-22 DIAGNOSIS — G8929 Other chronic pain: Secondary | ICD-10-CM | POA: Diagnosis present

## 2022-01-22 DIAGNOSIS — E669 Obesity, unspecified: Secondary | ICD-10-CM | POA: Diagnosis present

## 2022-01-22 DIAGNOSIS — Z955 Presence of coronary angioplasty implant and graft: Secondary | ICD-10-CM

## 2022-01-22 DIAGNOSIS — Z20822 Contact with and (suspected) exposure to covid-19: Secondary | ICD-10-CM | POA: Diagnosis present

## 2022-01-22 DIAGNOSIS — L89616 Pressure-induced deep tissue damage of right heel: Secondary | ICD-10-CM | POA: Diagnosis present

## 2022-01-22 DIAGNOSIS — Z22322 Carrier or suspected carrier of Methicillin resistant Staphylococcus aureus: Secondary | ICD-10-CM

## 2022-01-22 DIAGNOSIS — G931 Anoxic brain damage, not elsewhere classified: Secondary | ICD-10-CM | POA: Diagnosis present

## 2022-01-22 DIAGNOSIS — R52 Pain, unspecified: Secondary | ICD-10-CM | POA: Diagnosis not present

## 2022-01-22 DIAGNOSIS — J9602 Acute respiratory failure with hypercapnia: Secondary | ICD-10-CM | POA: Diagnosis present

## 2022-01-22 DIAGNOSIS — N170 Acute kidney failure with tubular necrosis: Secondary | ICD-10-CM | POA: Diagnosis present

## 2022-01-22 DIAGNOSIS — E870 Hyperosmolality and hypernatremia: Secondary | ICD-10-CM | POA: Diagnosis present

## 2022-01-22 DIAGNOSIS — M109 Gout, unspecified: Secondary | ICD-10-CM | POA: Diagnosis present

## 2022-01-22 DIAGNOSIS — L8990 Pressure ulcer of unspecified site, unspecified stage: Secondary | ICD-10-CM | POA: Insufficient documentation

## 2022-01-22 DIAGNOSIS — D45 Polycythemia vera: Secondary | ICD-10-CM | POA: Diagnosis present

## 2022-01-22 DIAGNOSIS — Z951 Presence of aortocoronary bypass graft: Secondary | ICD-10-CM

## 2022-01-22 DIAGNOSIS — Z811 Family history of alcohol abuse and dependence: Secondary | ICD-10-CM

## 2022-01-22 DIAGNOSIS — I2510 Atherosclerotic heart disease of native coronary artery without angina pectoris: Secondary | ICD-10-CM | POA: Diagnosis present

## 2022-01-22 DIAGNOSIS — M25531 Pain in right wrist: Secondary | ICD-10-CM | POA: Diagnosis not present

## 2022-01-22 DIAGNOSIS — J449 Chronic obstructive pulmonary disease, unspecified: Secondary | ICD-10-CM | POA: Diagnosis present

## 2022-01-22 DIAGNOSIS — J690 Pneumonitis due to inhalation of food and vomit: Secondary | ICD-10-CM | POA: Diagnosis not present

## 2022-01-22 LAB — URINALYSIS, ROUTINE W REFLEX MICROSCOPIC
Bilirubin Urine: NEGATIVE
Glucose, UA: 150 mg/dL — AB
Ketones, ur: NEGATIVE mg/dL
Nitrite: NEGATIVE
Protein, ur: NEGATIVE mg/dL
RBC / HPF: >50 RBC/hpf — ABNORMAL HIGH (ref 0–5)
Specific Gravity, Urine: 1.021 (ref 1.005–1.030)
pH: 5 (ref 5.0–8.0)

## 2022-01-22 LAB — CBC WITH DIFFERENTIAL/PLATELET
Abs Immature Granulocytes: 0.09 10*3/uL — ABNORMAL HIGH (ref 0.00–0.07)
Basophils Absolute: 0.1 10*3/uL (ref 0.0–0.1)
Basophils Relative: 1 %
Eosinophils Absolute: 0.2 10*3/uL (ref 0.0–0.5)
Eosinophils Relative: 2 %
HCT: 44 % (ref 39.0–52.0)
Hemoglobin: 13.5 g/dL (ref 13.0–17.0)
Immature Granulocytes: 1 %
Lymphocytes Relative: 11 %
Lymphs Abs: 1.5 10*3/uL (ref 0.7–4.0)
MCH: 29.9 pg (ref 26.0–34.0)
MCHC: 30.7 g/dL (ref 30.0–36.0)
MCV: 97.3 fL (ref 80.0–100.0)
Monocytes Absolute: 1.4 10*3/uL — ABNORMAL HIGH (ref 0.1–1.0)
Monocytes Relative: 10 %
Neutro Abs: 10.4 10*3/uL — ABNORMAL HIGH (ref 1.7–7.7)
Neutrophils Relative %: 75 %
Platelets: 283 10*3/uL (ref 150–400)
RBC: 4.52 MIL/uL (ref 4.22–5.81)
RDW: 17.3 % — ABNORMAL HIGH (ref 11.5–15.5)
WBC: 13.6 10*3/uL — ABNORMAL HIGH (ref 4.0–10.5)
nRBC: 0 % (ref 0.0–0.2)

## 2022-01-22 LAB — I-STAT ARTERIAL BLOOD GAS, ED
Acid-Base Excess: 8 mmol/L — ABNORMAL HIGH (ref 0.0–2.0)
Bicarbonate: 33.3 mmol/L — ABNORMAL HIGH (ref 20.0–28.0)
Calcium, Ion: 1.13 mmol/L — ABNORMAL LOW (ref 1.15–1.40)
HCT: 41 % (ref 39.0–52.0)
Hemoglobin: 13.9 g/dL (ref 13.0–17.0)
O2 Saturation: 92 %
Patient temperature: 98.91
Potassium: 4.1 mmol/L (ref 3.5–5.1)
Sodium: 147 mmol/L — ABNORMAL HIGH (ref 135–145)
TCO2: 35 mmol/L — ABNORMAL HIGH (ref 22–32)
pCO2 arterial: 48.6 mmHg — ABNORMAL HIGH (ref 32–48)
pH, Arterial: 7.444 (ref 7.35–7.45)
pO2, Arterial: 63 mmHg — ABNORMAL LOW (ref 83–108)

## 2022-01-22 LAB — GLUCOSE, CAPILLARY
Glucose-Capillary: 128 mg/dL — ABNORMAL HIGH (ref 70–99)
Glucose-Capillary: 129 mg/dL — ABNORMAL HIGH (ref 70–99)

## 2022-01-22 LAB — PROTIME-INR
INR: 3.4 — ABNORMAL HIGH (ref 0.8–1.2)
Prothrombin Time: 33.8 seconds — ABNORMAL HIGH (ref 11.4–15.2)

## 2022-01-22 LAB — COMPREHENSIVE METABOLIC PANEL
ALT: 80 U/L — ABNORMAL HIGH (ref 0–44)
AST: 103 U/L — ABNORMAL HIGH (ref 15–41)
Albumin: 2 g/dL — ABNORMAL LOW (ref 3.5–5.0)
Alkaline Phosphatase: 96 U/L (ref 38–126)
Anion gap: 11 (ref 5–15)
BUN: 36 mg/dL — ABNORMAL HIGH (ref 8–23)
CO2: 32 mmol/L (ref 22–32)
Calcium: 8.4 mg/dL — ABNORMAL LOW (ref 8.9–10.3)
Chloride: 104 mmol/L (ref 98–111)
Creatinine, Ser: 2.17 mg/dL — ABNORMAL HIGH (ref 0.61–1.24)
GFR, Estimated: 31 mL/min — ABNORMAL LOW (ref >60)
Glucose, Bld: 88 mg/dL (ref 70–99)
Potassium: 4.4 mmol/L (ref 3.5–5.1)
Sodium: 147 mmol/L — ABNORMAL HIGH (ref 135–145)
Total Bilirubin: 1.1 mg/dL (ref 0.3–1.2)
Total Protein: 7.3 g/dL (ref 6.5–8.1)

## 2022-01-22 LAB — LIPASE, BLOOD: Lipase: 22 U/L (ref 11–51)

## 2022-01-22 LAB — POCT I-STAT 7, (LYTES, BLD GAS, ICA,H+H)
Acid-Base Excess: 5 mmol/L — ABNORMAL HIGH (ref 0.0–2.0)
Bicarbonate: 28.6 mmol/L — ABNORMAL HIGH (ref 20.0–28.0)
Calcium, Ion: 1.15 mmol/L (ref 1.15–1.40)
HCT: 43 % (ref 39.0–52.0)
Hemoglobin: 14.6 g/dL (ref 13.0–17.0)
O2 Saturation: 92 %
Patient temperature: 98.6
Potassium: 4.2 mmol/L (ref 3.5–5.1)
Sodium: 145 mmol/L (ref 135–145)
TCO2: 30 mmol/L (ref 22–32)
pCO2 arterial: 36 mmHg (ref 32–48)
pH, Arterial: 7.508 — ABNORMAL HIGH (ref 7.35–7.45)
pO2, Arterial: 58 mmHg — ABNORMAL LOW (ref 83–108)

## 2022-01-22 LAB — MAGNESIUM: Magnesium: 2 mg/dL (ref 1.7–2.4)

## 2022-01-22 LAB — LACTIC ACID, PLASMA
Lactic Acid, Venous: 1.4 mmol/L (ref 0.5–1.9)
Lactic Acid, Venous: 1.5 mmol/L (ref 0.5–1.9)

## 2022-01-22 LAB — RESP PANEL BY RT-PCR (FLU A&B, COVID) ARPGX2
Influenza A by PCR: NEGATIVE
Influenza B by PCR: NEGATIVE
SARS Coronavirus 2 by RT PCR: NEGATIVE

## 2022-01-22 LAB — BRAIN NATRIURETIC PEPTIDE: B Natriuretic Peptide: 314.2 pg/mL — ABNORMAL HIGH (ref 0.0–100.0)

## 2022-01-22 LAB — APTT: aPTT: 44 seconds — ABNORMAL HIGH (ref 24–36)

## 2022-01-22 LAB — TROPONIN I (HIGH SENSITIVITY)
Troponin I (High Sensitivity): 22 ng/L — ABNORMAL HIGH (ref <18)
Troponin I (High Sensitivity): 20 ng/L — ABNORMAL HIGH (ref <18)

## 2022-01-22 LAB — AMMONIA: Ammonia: 18 umol/L (ref 9–35)

## 2022-01-22 LAB — ETHANOL: Alcohol, Ethyl (B): <10 mg/dL (ref <10)

## 2022-01-22 MED ORDER — FENTANYL CITRATE (PF) 100 MCG/2ML IJ SOLN
INTRAMUSCULAR | Status: AC
  Administered 2022-01-22: 100 ug via INTRAVENOUS
  Filled 2022-01-22: qty 2

## 2022-01-22 MED ORDER — LACTATED RINGERS IV BOLUS
500.0000 mL | Freq: Once | INTRAVENOUS | Status: AC
  Administered 2022-01-22: 500 mL via INTRAVENOUS

## 2022-01-22 MED ORDER — NOREPINEPHRINE 4 MG/250ML-% IV SOLN
INTRAVENOUS | Status: AC
  Administered 2022-01-22: 5 ug/min via INTRAVENOUS
  Filled 2022-01-22: qty 250

## 2022-01-22 MED ORDER — POLYETHYLENE GLYCOL 3350 17 G PO PACK
17.0000 g | PACK | Freq: Every day | ORAL | Status: DC
  Administered 2022-01-23 – 2022-01-24 (×2): 17 g
  Filled 2022-01-22 (×2): qty 1

## 2022-01-22 MED ORDER — MIDAZOLAM HCL 2 MG/2ML IJ SOLN
1.0000 mg | INTRAMUSCULAR | Status: DC | PRN
  Administered 2022-01-22 – 2022-01-24 (×4): 1 mg via INTRAVENOUS
  Filled 2022-01-22 (×5): qty 2

## 2022-01-22 MED ORDER — ROCURONIUM BROMIDE 10 MG/ML (PF) SYRINGE
PREFILLED_SYRINGE | INTRAVENOUS | Status: AC
  Administered 2022-01-22: 100 mg via INTRAVENOUS
  Filled 2022-01-22: qty 10

## 2022-01-22 MED ORDER — PANTOPRAZOLE SODIUM 40 MG IV SOLR
40.0000 mg | INTRAVENOUS | Status: DC
  Administered 2022-01-22: 40 mg via INTRAVENOUS
  Filled 2022-01-22: qty 10

## 2022-01-22 MED ORDER — DOCUSATE SODIUM 50 MG/5ML PO LIQD
100.0000 mg | Freq: Two times a day (BID) | ORAL | Status: DC
Start: 2022-01-22 — End: 2022-01-25
  Administered 2022-01-22 – 2022-01-24 (×5): 100 mg
  Filled 2022-01-22 (×5): qty 10

## 2022-01-22 MED ORDER — ALBUTEROL SULFATE (2.5 MG/3ML) 0.083% IN NEBU
2.5000 mg | INHALATION_SOLUTION | RESPIRATORY_TRACT | Status: DC | PRN
  Administered 2022-01-27 – 2022-01-29 (×2): 2.5 mg via RESPIRATORY_TRACT
  Filled 2022-01-22 (×2): qty 3

## 2022-01-22 MED ORDER — MIDAZOLAM HCL 2 MG/2ML IJ SOLN: 4.0000 mg | Freq: Once | INTRAMUSCULAR | Status: AC

## 2022-01-22 MED ORDER — VANCOMYCIN HCL IN DEXTROSE 1-5 GM/200ML-% IV SOLN: 1000.0000 mg | Freq: Once | INTRAVENOUS | Status: DC

## 2022-01-22 MED ORDER — FENTANYL 2500MCG IN NS 250ML (10MCG/ML) PREMIX INFUSION
25.0000 ug/h | INTRAVENOUS | Status: DC
  Administered 2022-01-22: 25 ug/h via INTRAVENOUS
  Administered 2022-01-23: 175 ug/h via INTRAVENOUS
  Administered 2022-01-23: 150 ug/h via INTRAVENOUS
  Administered 2022-01-24: 200 ug/h via INTRAVENOUS
  Administered 2022-01-24: 175 ug/h via INTRAVENOUS
  Filled 2022-01-22 (×5): qty 250

## 2022-01-22 MED ORDER — LACTATED RINGERS IV SOLN: INTRAVENOUS | Status: DC

## 2022-01-22 MED ORDER — SODIUM CHLORIDE 0.9 % IV SOLN
2.0000 g | Freq: Two times a day (BID) | INTRAVENOUS | Status: AC
  Administered 2022-01-22 – 2022-01-28 (×13): 2 g via INTRAVENOUS
  Filled 2022-01-22 (×13): qty 12.5

## 2022-01-22 MED ORDER — FENTANYL CITRATE (PF) 100 MCG/2ML IJ SOLN
25.0000 ug | Freq: Once | INTRAMUSCULAR | Status: AC
  Administered 2022-01-22: 25 ug via INTRAVENOUS

## 2022-01-22 MED ORDER — PHENTOLAMINE MESYLATE 5 MG IJ SOLR
10.0000 mg | Freq: Once | INTRAMUSCULAR | Status: AC
Start: 2022-01-22 — End: 2022-01-22
  Administered 2022-01-22: 10 mg via SUBCUTANEOUS
  Filled 2022-01-22: qty 10

## 2022-01-22 MED ORDER — MIDAZOLAM HCL 2 MG/2ML IJ SOLN
1.0000 mg | INTRAMUSCULAR | Status: DC | PRN
  Administered 2022-01-22 – 2022-01-23 (×2): 1 mg via INTRAVENOUS

## 2022-01-22 MED ORDER — FENTANYL BOLUS VIA INFUSION
25.0000 ug | INTRAVENOUS | Status: DC | PRN
  Administered 2022-01-23: 100 ug via INTRAVENOUS
  Administered 2022-01-23 – 2022-01-24 (×3): 50 ug via INTRAVENOUS
  Administered 2022-01-25: 100 ug via INTRAVENOUS

## 2022-01-22 MED ORDER — NOREPINEPHRINE 4 MG/250ML-% IV SOLN
0.0000 ug/min | INTRAVENOUS | Status: DC
  Administered 2022-01-23: 8 ug/min via INTRAVENOUS
  Filled 2022-01-22 (×2): qty 250

## 2022-01-22 MED ORDER — LACTATED RINGERS IV BOLUS (SEPSIS)
500.0000 mL | Freq: Once | INTRAVENOUS | Status: AC
  Administered 2022-01-22: 500 mL via INTRAVENOUS

## 2022-01-22 MED ORDER — MIDAZOLAM HCL 2 MG/2ML IJ SOLN
INTRAMUSCULAR | Status: AC
  Administered 2022-01-22: 4 mg via INTRAVENOUS
  Filled 2022-01-22: qty 2

## 2022-01-22 MED ORDER — FENTANYL CITRATE (PF) 100 MCG/2ML IJ SOLN: 100.0000 ug | Freq: Once | INTRAMUSCULAR | Status: AC

## 2022-01-22 MED ORDER — CHLORHEXIDINE GLUCONATE CLOTH 2 % EX PADS
6.0000 | MEDICATED_PAD | Freq: Every day | CUTANEOUS | Status: DC
  Administered 2022-01-23 – 2022-01-29 (×7): 6 via TOPICAL

## 2022-01-22 MED ORDER — FENTANYL CITRATE (PF) 100 MCG/2ML IJ SOLN
INTRAMUSCULAR | Status: AC
  Filled 2022-01-22: qty 2

## 2022-01-22 MED ORDER — ETOMIDATE 2 MG/ML IV SOLN
20.0000 mg | Freq: Once | INTRAVENOUS | Status: DC
Start: 2022-01-22 — End: 2022-01-22

## 2022-01-22 MED ORDER — ROCURONIUM BROMIDE 10 MG/ML (PF) SYRINGE: 100.0000 mg | PREFILLED_SYRINGE | Freq: Once | INTRAVENOUS | Status: AC

## 2022-01-22 MED ORDER — ARFORMOTEROL TARTRATE 15 MCG/2ML IN NEBU
15.0000 ug | INHALATION_SOLUTION | Freq: Two times a day (BID) | RESPIRATORY_TRACT | Status: DC
  Administered 2022-01-22 – 2022-01-28 (×13): 15 ug via RESPIRATORY_TRACT
  Filled 2022-01-22 (×15): qty 2

## 2022-01-22 MED ORDER — CEFEPIME HCL 2 G IV SOLR
2.0000 g | Freq: Once | INTRAVENOUS | Status: AC
Start: 2022-01-22 — End: 2022-01-22
  Administered 2022-01-22: 2 g via INTRAVENOUS
  Filled 2022-01-22: qty 12.5

## 2022-01-22 MED ORDER — STERILE WATER FOR INJECTION IJ SOLN
INTRAMUSCULAR | Status: AC
  Administered 2022-01-22: 10 mL
  Filled 2022-01-22: qty 20

## 2022-01-22 MED ORDER — BUDESONIDE 0.5 MG/2ML IN SUSP
0.5000 mg | Freq: Two times a day (BID) | RESPIRATORY_TRACT | Status: DC
  Administered 2022-01-22 – 2022-01-28 (×13): 0.5 mg via RESPIRATORY_TRACT
  Filled 2022-01-22 (×14): qty 2

## 2022-01-22 MED ORDER — VANCOMYCIN HCL 2000 MG/400ML IV SOLN
2000.0000 mg | Freq: Once | INTRAVENOUS | Status: AC
  Administered 2022-01-22: 2000 mg via INTRAVENOUS
  Filled 2022-01-22: qty 400

## 2022-01-22 MED ORDER — ETOMIDATE 2 MG/ML IV SOLN
INTRAVENOUS | Status: AC
  Filled 2022-01-22: qty 20

## 2022-01-22 MED ORDER — MIDAZOLAM HCL 2 MG/2ML IJ SOLN
INTRAMUSCULAR | Status: AC
  Filled 2022-01-22: qty 2

## 2022-01-22 MED ORDER — AMIODARONE IV BOLUS ONLY 150 MG/100ML
150.0000 mg | Freq: Once | INTRAVENOUS | Status: AC
Start: 2022-01-23 — End: 2022-01-23
  Administered 2022-01-22: 150 mg via INTRAVENOUS
  Filled 2022-01-22: qty 100

## 2022-01-22 MED ORDER — VANCOMYCIN HCL IN DEXTROSE 1-5 GM/200ML-% IV SOLN
1000.0000 mg | INTRAVENOUS | Status: DC
  Administered 2022-01-23 – 2022-01-25 (×3): 1000 mg via INTRAVENOUS
  Filled 2022-01-22 (×3): qty 200

## 2022-01-22 NOTE — ED Notes (Signed)
Attempted report to 9m still unable to give

## 2022-01-22 NOTE — ED Triage Notes (Signed)
Patient arrives from nursing facility with complaint of [place]. Nursing staff doing rounding and found to have 02 sat of 60% on room air. Patient lethargic, warm to the touch. During exam EMS states absent lung sounds to the left side. Patient placed on NRB at 15 L with improvement to O2 sat of 88%. BP 120s/80, HR in the 80s. Patient will intermittently arouse to voice and answer questions but overall lethargic on presentation.

## 2022-01-22 NOTE — Progress Notes (Signed)
[facility] Progress Note Patient Name: [name] DOB: 07-01-1946 MRN: [id number]   Date of Service  02/13/2022  HPI/Events of Note  AFIB with RVR - Ventricular rate = 100 to 120. This patient has a history of AFIB and was on Amiodarone PO aand Eliquis at home which are now held. BP = 109/53 with MAP = 66.  eICU Interventions  Plan: Amiodarone 150 mg load IV over 10 minutes. BMP and Mg++ level STAT.      Intervention Category Major Interventions: Arrhythmia - evaluation and management  Folasade Mooty [place] 02/02/2022, 11:20 PM

## 2022-01-22 NOTE — ED Notes (Signed)
Attempted report to 37M, still unable to give

## 2022-01-22 NOTE — ED Notes (Signed)
Attempted report to 24M. Unable to give.

## 2022-01-22 NOTE — Progress Notes (Signed)
[facility] Progress Note Patient Name: [name] DOB: 02/14/1946 MRN: [id number]   Date of Service  02/05/2022  HPI/Events of Note  Agitation - Nursing request for bilateral wrist restraints.   eICU Interventions  Plan: Bilateral soft wrist restraints X 12 hours.      Intervention Category Major Interventions: Arrhythmia - evaluation and management  [name] 02/11/2022, 9:09 PM

## 2022-01-22 NOTE — Progress Notes (Signed)
PCCM Progress Note   Called to bedside for continued respiratory distress with maxed BIPAP setting with limited improvement seen since admit. Decision made to precede with intubation. See procedure note for full details. Significant other called for update but call not answered  Noriel Guthrie D. Kenton Kingfisher, NP-C [facility] Personal contact information can be found on Amion  02/15/2022, 5:41 PM

## 2022-01-22 NOTE — ED Notes (Signed)
Attempted report to 68M, unable to give. House supervisor aware

## 2022-01-22 NOTE — ED Notes (Signed)
Notified Dr. Vanita Panda that patient's blood pressure is starting to decrease to 93/71. Patient still maintaining mentation at this time.

## 2022-01-22 NOTE — Procedures (Signed)
Intubation Procedure Note  [name]  [id number]  01-22-1946  Date:01/23/2022  Time:5:58 PM   Provider Performing:Kianni Lheureux R Cayci Mcnabb    Procedure: Intubation (41287)  Indication(s) Respiratory Failure  Consent Unable to obtain consent due to emergent nature of procedure.   Anesthesia Versed, Fentanyl, and Rocuronium   Time Out Verified patient identification, verified procedure, site/side was marked, verified correct patient position, special equipment/implants available, medications/allergies/relevant history reviewed, required imaging and test results available.   Sterile Technique Usual hand hygeine, masks, and gloves were used   Procedure Description Patient positioned in bed supine.  Sedation given as noted above.  Patient was intubated with endotracheal tube using Glidescope.  View was Grade 1 full glottis .  Number of attempts was 1.  Colorimetric CO2 detector was consistent with tracheal placement.   Complications/Tolerance None; patient tolerated the procedure well. Chest X-ray is ordered to verify placement.   EBL none   Specimen(s) None

## 2022-01-22 NOTE — Progress Notes (Signed)
Pharmacy Antibiotic Note  [name] is a 76 y.o. male presented on 02/14/2022 with AMS and shortness of breath and currently on BiPap with concern for sepsis. From nursing facility where he has been on ceftaroline since 01/17/2022. Scr elevated at presentation 2.17 (baseline appears to be 1.3). Pharmacy has been consulted for cefepime and vancomycin dosing.  AUC goal: 400-550  Plan: Vancomycin '2000mg'$  x 1 load followed by Vancomycin '1000mg'$  q24h (eAUC 480, Scr 2.17) Cefepime 2g q12h F/u renal function, clinical course, and levels as indicated  Height: '5\' 6"'$  (167.6 cm) Weight: 108 kg (238 lb 1.6 oz) IBW/kg (Calculated) : 63.8  Temp (24hrs), Avg:98.9 F (37.2 C), Min:98.9 F (37.2 C), Max:98.9 F (37.2 C)  Recent Labs  Lab 01/26/2022 0927  WBC 13.6*  CREATININE 2.17*  LATICACIDVEN 1.4    Estimated Creatinine Clearance: 34.4 mL/min (A) (by C-G formula based on SCr of 2.17 mg/dL (H)).    No Known Allergies  Antimicrobials this admission: Cefepime 7/6 > Vancomycin 7/6 >  Dose adjustments this admission:  Microbiology results: 7/6 BCx: pending  Thank you for allowing pharmacy to be a part of this patient's care.  Levonne Spiller 01/28/2022 11:31 AM

## 2022-01-22 NOTE — Progress Notes (Signed)
RT transported on BiPAP with RN. Patient remained stable

## 2022-01-22 NOTE — H&P (Signed)
NAME:  [name], MRN:  [id number], DOB:  Jun 20, 1946, LOS: 0 ADMISSION DATE:  02/13/2022, CONSULTATION DATE:  02/11/2022 REFERRING MD:  Dr. Vanita Panda, CHIEF COMPLAINT:  SOB/ hypoxia   History of Present Illness:  HPI obtained from medical chart review given encephalopathy.   76 year old male with extensive hx as below, most recently hospitalized 6/9 to 01/05/22 for L3-L4 discitis/ osteomyelitis discharged on 6 week course of Teflaro presenting from SNF with complaints of SOB, hypoxia with sats 60% on room air, and altered mental status.    In ER, initially afebrile, HR 80's, normotensive, and on NRB with sats 88> placed on BiPAP.  Workup noted for CXR with new left moderate pleural effusion with collapse vs consolidation, Na 147, sCr 2.17, AST/ ALT 103/ 80, INR 3.4, WBC 13.6, normal lactic, trop hs 22, BNP 314, and ABG 7.44/ 48/ 63/ 33.  Became progressively hypotensive and placed on norepinephrine.  PCCM called for ICU admit.   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 rate 17, height 5' 6" (1.676 m), weight 108 kg, SpO2 95 %.    Vent Mode: BIPAP FiO2 (%):  [70 %-80 %] 80 %   Intake/Output Summary (Last 24 hours) at 02/02/2022 1239 Last data filed at 02/15/2022 1039 Gross per 24 hour  Intake 584.22 ml  Output --  Net  584.22 ml   Filed Weights   02/11/2022 0925  Weight: 108 kg   Examination: General:  chronically ill obese older male sitting upright in ER stretcher, no distress HEENT: MM pink/very dry, pupils 4/reactive, anicteric  Neuro: responds to loud verbal or touch, lethargic, will follow some intermittent commands and say his name, understands some English, MAE- tremor noted in both hands, yells out if you touch his right hand (no obvious deformity/ swelling) or feet CV: rr, distant, ?murmur, RIJ tunneled cath single lumen> not sutured, site ok PULM:  non labored, on NRB, right clear, left very diminished GI: protuberant, soft, NT, hypobs, foley Extremities: warm/dry, no LE edema  Skin: no rashes or lesions   Resolved Hospital Problem list    Assessment & Plan:   Shock, presumed septic  - UA with trace leukocytes, suspected LLL pna, and recent L3-L4 discitis/ osteomyelitis, r/o bacteremia - can not rule out cardiogenic/ RV component given elevated BNP, AST/ALT ?hepatic congestion.  Trop hs flat 22> 20.  Check coox and consider repeat echo (most recent echo 12/22/21> EF 55-60%, mild LVH, indeterminate DD, normal RV and PASP, moderate AR, trivial TR, s/p mitral clip, aortic ascending dilation> 50 mm) - continue NE for MAP goal > 65 - still looks dry.  S/p NS 548m bolus.  Repeat now.  - continue cefepime/ vanc/ flagyl for now.  Check MRSA PCR.   - lactic is reassuring> 1.4> 1.5 - follow BC and UC - send urine strep  Hypoxic and  mild hypercapnic respiratory failure ? Left pleural effusion/ suspected LLL PNA Hx COPD - difficult to see effusion on bedside US - cont supplemental O2 for sat goal > 90% - high risk for intubation, keep NPO - BiPAP prn, also could consider HFNC - CXR in AM  - can check VBG if worsening mental status  - prn BD, brovanna/ pulmicort in place of home symbicort   Hx Afib on Eliquis/ amio - currently in NSR - hold eliquis and amio given elevated INR/ LFTs - keep  K>4, Mag > 2  AKI Hypernatremia  - likely prerenal, looks dry - additional fluid bolus as above, then NS MVIF - cont foley - Trend BMP / urinary output - Replace electrolytes as indicated - Avoid nephrotoxic agents, ensure adequate renal perfusion  Elevated liver enzymes Coagulapathy  - ammonia wnl/ 18 - INR 3.4 - recheck LFTs/ INR in am   Acute encephalopathy - likely multifactorial given hypoxia, sepsis, AKI w/ chronic subxone/ norco although pupils are not pinpoint  - serial neuro exams, currently nonfocal   Combined HF w/ recovered EF 55-60% CAD w/ prior CABG HTN - tele monitoring  - hold home toprol and torsemide while on pressors  - hold lipitor with elevated LFTs  Prior L3-L4 discitis/ osteomyelitis  - has R tunneled CVL for 6 week course of teflaro (stop date 02/05/22)  Chronic pain - hold pta suboxone, norco and gabapentin for now while altered   Severe MR s/p TAVR 5/23 with mitraclip, severe TR/ AI, moderate aortic regurgitation - outpt cards f/u   Aortic dilation  - semi annual CTAs per cardiology  Best Practice (right click and "Reselect all SmartList Selections" daily)   Diet/type: NPO DVT prophylaxis: SCD GI prophylaxis: N/A Lines: Central line- PTA RIJ tunneled single lumen CVL Foley:  Yes, and it is still needed Code Status:  full code Last date of multidisciplinary goals of care discussion [pending]  Attempted to call NOK listed> Colin Mulders [phone] busy line vs off line  Labs   CBC: Recent Labs  Lab 01/23/2022 0927 02/11/2022 1008  WBC 13.6*  --   NEUTROABS 10.4*  --   HGB 13.5 13.9  HCT 44.0 41.0  MCV 97.3  --   PLT 283  --     Basic Metabolic Panel: Recent Labs  Lab 02/05/2022 0927 01/23/2022 1008  NA 147* 147*  K 4.4 4.1  CL 104  --   CO2 32  --   GLUCOSE 88  --   BUN 36*  --   CREATININE 2.17*  --   CALCIUM 8.4*  --    GFR: Estimated Creatinine Clearance: 34.4 mL/min (A) (by C-G formula based on SCr of 2.17 mg/dL  (H)). Recent Labs  Lab 02/08/2022 0927  WBC 13.6*  LATICACIDVEN 1.4    Liver Function Tests: Recent Labs  Lab 02/04/2022 0927  AST 103*  ALT 80*  ALKPHOS 96  BILITOT 1.1  PROT 7.3  ALBUMIN 2.0*   Recent Labs  Lab 02/10/2022 0927  LIPASE 22   Recent Labs  Lab 02/04/2022 0927  AMMONIA 18    ABG    Component Value Date/Time   PHART 7.444 02/11/2022 1008   PCO2ART 48.6 (H) 02/07/2022 1008   PO2ART 63 (L) 01/19/2022 1008   HCO3 33.3 (H) 01/18/2022 1008   TCO2 35 (H) 02/14/2022 1008   ACIDBASEDEF 0.8 12/31/2021 1019   O2SAT 92 02/09/2022 1008     Coagulation Profile: Recent Labs  Lab 01/21/2022 [phone]  INR 3.4*    Cardiac Enzymes: No results for input(s): "CKTOTAL", "CKMB", "CKMBINDEX", "TROPONINI" in the last 168 hours.  HbA1C: Hgb A1c MFr Bld  Date/Time Value Ref Range Status  08/22/2021 08:21 AM 6.0 (H) 4.8 - 5.6 % Final    Comment:    (NOTE) Pre diabetes:          5.7%-6.4%  Diabetes:              >6.4%  Glycemic control for   <7.0% adults with diabetes   04/26/2021 06:13 AM 6.0 (H) 4.8 - 5.6 % Final    Comment:    (NOTE) Pre diabetes:          5.7%-6.4%  Diabetes:              >6.4%  Glycemic control for   <7.0% adults with diabetes     CBG: No results for input(s): "GLUCAP" in the last 168 hours.  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   Surgical History:   Past Surgical History:  Procedure Laterality Date   CARDIAC CATHETERIZATION     CARDIAC ELECTROPHYSIOLOGY MAPPING AND ABLATION  08/2010   [name] 09/07/2010 (12/14/2012)   CARDIAC SURGERY     CARDIOVERSION N/A 10/16/2021   Procedure: CARDIOVERSION;  Surgeon: Larey Dresser, MD;  Location: [facility] ENDOSCOPY;  Service: Cardiovascular;  Laterality: N/A;   COLONOSCOPY     15-20 years ago had colon in [place] N/A 02/12/2020   Procedure: COLONOSCOPY WITH PROPOFOL;  Surgeon: Ladene Artist, MD;  Location: [facility] ENDOSCOPY;  Service: Endoscopy;  Laterality: N/A;   CORONARY STENT INTERVENTION N/A 10/10/2020   Procedure: CORONARY STENT INTERVENTION;  Surgeon: Lorretta Harp, MD;  Location: [place] CV LAB;  Service: Cardiovascular;  Laterality: N/A;   EXCISIONAL HEMORRHOIDECTOMY  1970's   IR FLUORO GUIDE CV LINE RIGHT  01/05/2022   IR LUMBAR DISC ASPIRATION W/IMG GUIDE  12/29/2021   IR LUMBAR DISC ASPIRATION W/IMG GUIDE  12/29/2021   IR US GUIDE VASC ACCESS RIGHT  01/05/2022   LOOP RECORDER IMPLANT N/A 08/23/2013   Procedure: LOOP RECORDER IMPLANT;  Surgeon: Deboraha Sprang, MD;  Location: [facility] CATH LAB;  Service: Cardiovascular;  Laterality: N/A;   MITRAL VALVE REPAIR N/A 11/20/2021   Procedure: MITRAL VALVE REPAIR;  Surgeon: [name], MD;  Location: [place] CV LAB;  Service: Cardiovascular;  Laterality: N/A;   MULTIPLE EXTRACTIONS WITH ALVEOLOPLASTY Bilateral 01/24/2016   Procedure: MULTIPLE EXTRACTION WITH ALVEOLOPLASTY BILATERAL;  Surgeon: Diona Browner, DDS;  Location: [place];  Service: Oral Surgery;  Laterality: Bilateral;   MULTIPLE TOOTH EXTRACTIONS  01/24/2016   MULTIPLE EXTRACTION WITH ALVEOLOPLASTY BILATERAL (Bilateral)   POLYPECTOMY  02/12/2020   Procedure: POLYPECTOMY;  Surgeon: Ladene Artist, MD;  Location: [facility] ENDOSCOPY;  Service: Endoscopy;;    RIGHT HEART CATH N/A 10/03/2021   Procedure: RIGHT HEART CATH;  Surgeon: [name], MD;  Location: [place] CV LAB;  Service:  Cardiovascular;  Laterality: N/A;   RIGHT/LEFT HEART CATH AND CORONARY ANGIOGRAPHY N/A 10/09/2020   Procedure: RIGHT/LEFT HEART CATH AND CORONARY ANGIOGRAPHY;  Surgeon: Burnell Blanks, MD;  Location: [place] CV LAB;  Service: Cardiovascular;  Laterality: N/A;   TEE WITHOUT CARDIOVERSION N/A 10/16/2021   Procedure: TRANSESOPHAGEAL ECHOCARDIOGRAM (TEE);  Surgeon: Larey Dresser, MD;  Location: [facility] ENDOSCOPY;  Service: Cardiovascular;  Laterality: N/A;   TEE WITHOUT CARDIOVERSION N/A 11/07/2021   Procedure: TRANSESOPHAGEAL ECHOCARDIOGRAM (TEE);  Surgeon: Werner Lean, MD;  Location: [facility] ENDOSCOPY;  Service: Cardiovascular;  Laterality: N/A;   TEE WITHOUT CARDIOVERSION N/A 11/20/2021   Procedure: TRANSESOPHAGEAL ECHOCARDIOGRAM (TEE);  Surgeon: [name], MD;  Location: [place] CV LAB;  Service: Cardiovascular;  Laterality: N/A;     Social History:   reports that he quit smoking about 8 weeks ago. His smoking use included cigarettes. He has a 100.00 pack-year smoking history. He has never used smokeless tobacco. He reports that he does not currently use alcohol after a past usage of about 2.0 standard drinks of alcohol per week. He reports that he does not currently use drugs after having used the following drugs: Heroin.   Family History:  His family history includes Alcohol abuse in his father; Cirrhosis in his father; Diabetes in his mother; Hypertension in his mother. There is no history of Colon cancer, Rectal cancer, Stomach cancer, Esophageal cancer, or Colon polyps.   Allergies No Known Allergies   Home Medications  Prior to Admission medications   Medication Sig Start Date End Date Taking? Authorizing Provider  acetaminophen (TYLENOL) 500 MG tablet Take 2 tablets (1,000 mg total) by mouth every 6 (six) hours as needed for  moderate pain. 12/26/21   [name], PA-C  albuterol [facility] HFA) [phone] Base) MCG/ACT inhaler Inhale 1-2 puffs into the lungs every 6 (six) hours as needed for wheezing or shortness of breath. 01/11/19   [name], MD  allopurinol (ZYLOPRIM) 300 MG tablet Take 0.5 tablets (150 mg total) by mouth daily. 01/05/22   [name], Belkys A, MD  amiodarone (PACERONE) 200 MG tablet Take 1 tablet (200 mg total) by mouth daily. 10/23/21   Larey Dresser, MD  apixaban (ELIQUIS) 5 MG TABS tablet Take 1 tablet (5 mg total) by mouth 2 (two) times daily. 04/26/21   Jose Persia, MD  atorvastatin (LIPITOR) 80 MG tablet Take 1 tablet (80 mg total) by mouth daily. 04/26/21   Jose Persia, MD  buprenorphine-naloxone (SUBOXONE) 8-2 mg SUBL SL tablet Place 1 tablet under the tongue daily. 01/05/22   [name], Belkys A, MD  ceftaroline (TEFLARO) IVPB Inject 600 mg into the vein every 12 (twelve) hours. Indication:  osteomyelitis First Dose: Yes Last Day of Therapy:  02/05/2022 Labs - Once weekly:  CBC/D, CMP, ESR, and CRP Method of administration: Mini-Bag Plus / Control-A-Flo (CAF) Method of administration may be changed at the discretion of home infusion pharmacist based upon assessment of the patient and/or caregiver's ability to self-administer the medication ordered. 01/05/22 02/08/22  [name], [name], MD  dapagliflozin propanediol (FARXIGA) 10 MG TABS tablet Take 1 tablet (10 mg total) by mouth daily before breakfast. 09/09/21   [name], PA-C  gabapentin (NEURONTIN) 100 MG capsule Take 1 capsule (100 mg total) by mouth 2 (two) times daily. 12/23/21   [name], MD  HYDROcodone-acetaminophen (NORCO/VICODIN) 5-325 MG tablet Take 1-2 tablets by mouth every 4 (four) hours as needed for moderate pain. 01/05/22   [name],  Belkys A, MD  metoprolol succinate (TOPROL-XL) 25 MG 24 hr tablet Take 0.5 tablets (12.5 mg total) by mouth daily. 11/21/21   [name], NP  senna-docusate (SENOKOT-S)  8.6-50 MG tablet Take 1 tablet by mouth at bedtime as needed for mild constipation. 08/31/21   [name], MD  SYMBICORT 160-4.5 MCG/ACT inhaler Inhale 2 puffs into the lungs daily as needed (SOB). 07/02/21   [provider]  torsemide (DEMADEX) 20 MG tablet Take 1 tablet (20 mg total) by mouth daily. 01/05/22   Elmarie Shiley, MD     Critical care time: 50 mins     Kennieth Rad, ACNP [facility] 02/11/2022, 3:11 PM  See Amion for pager If no response to pager, please call PCCM consult pager After 7:00 pm call Elink

## 2022-01-22 NOTE — ED Notes (Signed)
Attempted report to 3M 

## 2022-01-22 NOTE — ED Provider Notes (Signed)
[facility] EMERGENCY DEPARTMENT Provider Note   CSN: [id number] Arrival date & time: 02/12/2022  0913     History  Chief Complaint  Patient presents with   Shortness of Breath    [name] is Hamilton 76 y.o. male.  HPI Patient presents for EMS due to nursing home staff concerns of altered mental status, respiratory distress.  Patient cannot answer any questions at length, as noted, answers Hamilton few questions intermittently, level 5 caveat secondary to acuity of condition.  Per EMS report the patient was found by nursing staff with increased work of breathing, restlessness.  EMS reports that on arrival he was hypoxic, 80% on room air, this improved with nonrebreather mask.  There was difficult to auscultate breath sounds on the left reportedly.     Home Medications Prior to Admission medications   Medication Sig Start Date End Date Taking? Authorizing Provider  acetaminophen (TYLENOL) 500 MG tablet Take 2 tablets (1,000 mg total) by mouth every 6 (six) hours as needed for moderate pain. 12/26/21   Ryan Moras, PA-C  albuterol [facility] HFA) [phone] Base) MCG/ACT inhaler Inhale 1-2 puffs into the lungs every 6 (six) hours as needed for wheezing or shortness of breath. 01/11/19   Ryan Course, MD  allopurinol (ZYLOPRIM) 300 MG tablet Take 0.5 tablets (150 mg total) by mouth daily. 01/05/22   Ryan, Belkys A, MD  amiodarone (PACERONE) 200 MG tablet Take 1 tablet (200 mg total) by mouth daily. 10/23/21   Larey Dresser, MD  apixaban (ELIQUIS) 5 MG TABS tablet Take 1 tablet (5 mg total) by mouth 2 (two) times daily. 04/26/21   Ryan Persia, MD  atorvastatin (LIPITOR) 80 MG tablet Take 1 tablet (80 mg total) by mouth daily. 04/26/21   Ryan Persia, MD  buprenorphine-naloxone (SUBOXONE) 8-2 mg SUBL SL tablet Place 1 tablet under the tongue daily. 01/05/22   Ryan, Belkys A, MD  ceftaroline (TEFLARO) IVPB Inject 600 mg into the vein every 12 (twelve) hours.  Indication:  osteomyelitis First Dose: Yes Last Day of Therapy:  02/05/2022 Labs - Once weekly:  CBC/D, CMP, ESR, and CRP Method of administration: Mini-Bag Plus / Control-Hamilton-Flo (CAF) Method of administration may be changed at the discretion of home infusion pharmacist based upon assessment of the patient and/or caregiver's ability to self-administer the medication ordered. 01/05/22 02/08/22  Ryan, [name], MD  dapagliflozin propanediol (FARXIGA) 10 MG TABS tablet Take 1 tablet (10 mg total) by mouth daily before breakfast. 09/09/21   Ryan Jester M, PA-C  gabapentin (NEURONTIN) 100 MG capsule Take 1 capsule (100 mg total) by mouth 2 (two) times daily. 12/23/21   Ryan Axon, MD  HYDROcodone-acetaminophen (NORCO/VICODIN) 5-325 MG tablet Take 1-2 tablets by mouth every 4 (four) hours as needed for moderate pain. 01/05/22   Ryan, Belkys A, MD  metoprolol succinate (TOPROL-XL) 25 MG 24 hr tablet Take 0.5 tablets (12.5 mg total) by mouth daily. 11/21/21   Ryan Raymond, NP  senna-docusate (SENOKOT-S) 8.6-50 MG tablet Take 1 tablet by mouth at bedtime as needed for mild constipation. 08/31/21   Ryan Manes, MD  SYMBICORT 160-4.5 MCG/ACT inhaler Inhale 2 puffs into the lungs daily as needed (SOB). 07/02/21   [provider]  torsemide (DEMADEX) 20 MG tablet Take 1 tablet (20 mg total) by mouth daily. 01/05/22   Ryan, [name], MD      Allergies    Patient has no known allergies.    Review of Systems  Review of Systems  Unable to perform ROS: Acuity of condition    Physical Exam Updated Vital Signs BP 99/62   Pulse 69   Temp 98.9 F (37.2 C) (Rectal)   Resp 16   Ht 5' 6"  (1.676 Hamilton)   Wt 108 kg   SpO2 93%   BMI 38.43 kg/Hamilton  Physical Exam Vitals and nursing note reviewed.  Constitutional:      General: He is in acute distress.     Appearance: He is well-developed. He is ill-appearing and diaphoretic.  HENT:     Head: Normocephalic and atraumatic.  Eyes:      Conjunctiva/sclera: Conjunctivae normal.  Cardiovascular:     Rate and Rhythm: Regular rhythm. Tachycardia present.  Pulmonary:     Effort: Tachypnea, accessory muscle usage and respiratory distress present.     Breath sounds: Examination of the right-upper field reveals decreased breath sounds. Examination of the left-upper field reveals decreased breath sounds. Examination of the right-middle field reveals decreased breath sounds. Examination of the left-middle field reveals decreased breath sounds. Examination of the right-lower field reveals decreased breath sounds. Examination of the left-lower field reveals decreased breath sounds. Decreased breath sounds present.  Chest:    Abdominal:     General: There is no distension.     Comments: Protuberant abdomen without guarding  Skin:    General: Skin is warm.  Neurological:     Mental Status: He is alert.     Comments: Patient moves all extremities spontaneously, minimally, answer some questions, briefly, but is listless, requires awakening with stimulation  Twitching in bilateral upper extremities  Psychiatric:     Comments: Withdrawn     ED Results / Procedures / Treatments   Labs (all labs ordered are listed, but only abnormal results are displayed) Labs Reviewed  COMPREHENSIVE METABOLIC PANEL - Abnormal; Notable for the following components:      Result Value   Sodium 147 (*)    BUN 36 (*)    Creatinine, Ser 2.17 (*)    Calcium 8.4 (*)    Albumin 2.0 (*)    AST 103 (*)    ALT 80 (*)    GFR, Estimated 31 (*)    All other components within normal limits  CBC WITH DIFFERENTIAL/PLATELET - Abnormal; Notable for the following components:   WBC 13.6 (*)    RDW 17.3 (*)    Neutro Abs 10.4 (*)    Monocytes Absolute 1.4 (*)    Abs Immature Granulocytes 0.09 (*)    All other components within normal limits  PROTIME-INR - Abnormal; Notable for the following components:   Prothrombin Time 33.8 (*)    INR 3.4 (*)    All  other components within normal limits  BRAIN NATRIURETIC PEPTIDE - Abnormal; Notable for the following components:   B Natriuretic Peptide 314.2 (*)    All other components within normal limits  APTT - Abnormal; Notable for the following components:   aPTT 44 (*)    All other components within normal limits  I-STAT ARTERIAL BLOOD GAS, ED - Abnormal; Notable for the following components:   pCO2 arterial 48.6 (*)    pO2, Arterial 63 (*)    Bicarbonate 33.3 (*)    TCO2 35 (*)    Acid-Base Excess 8.0 (*)    Sodium 147 (*)    Calcium, Ion 1.13 (*)    All other components within normal limits  TROPONIN I (HIGH SENSITIVITY) - Abnormal; Notable for the following components:  Troponin I (High Sensitivity) 22 (*)    All other components within normal limits  RESP PANEL BY RT-PCR (FLU Hamilton&B, COVID) [id number]  CULTURE, BLOOD (ROUTINE X 2)  URINE CULTURE  SARS CORONAVIRUS 2 BY RT PCR  CULTURE, BLOOD (ROUTINE X 2) W REFLEX TO ID PANEL  ETHANOL  LACTIC ACID, PLASMA  LIPASE, BLOOD  AMMONIA  LACTIC ACID, PLASMA  URINALYSIS, ROUTINE W REFLEX MICROSCOPIC  TROPONIN I (HIGH SENSITIVITY)    EKG EKG Interpretation  Date/Time:  Thursday January 22 2022 09:22:01 EDT Ventricular Rate:  79 PR Interval:  183 QRS Duration: 96 QT Interval:  411 QTC Calculation: 472 R Axis:   3 Text Interpretation: Sinus rhythm Probable left atrial enlargement Artifact T wave abnormality Abnormal ECG Confirmed by Carmin Muskrat [phone]) on 02/11/2022 9:36:39 AM  Radiology DG Chest Portable 1 View  Result Date: 01/21/2022 CLINICAL DATA:  Respiratory distress. EXAM: PORTABLE CHEST 1 VIEW COMPARISON:  Chest x-ray dated December 30, 2021. FINDINGS: New tunneled right internal jugular central venous catheter with tip in the distal SVC. Unchanged loop recorder and mild cardiomegaly. New moderate left pleural effusion with lingula and left lower lobe collapse and/or consolidation. Right lung is clear. No pneumothorax. No acute osseous  abnormality. IMPRESSION: 1. New moderate left pleural effusion with lingula and left lower lobe collapse and/or consolidation. Electronically Signed   By: Titus Dubin Hamilton.D.   On: 02/08/2022 09:35    Procedures Procedures    Medications Ordered in ED Medications  lactated ringers infusion ( Intravenous New Bag/Given 02/14/2022 1056)  vancomycin (VANCOREADY) IVPB 2000 mg/400 mL (2,000 mg Intravenous New Bag/Given 01/30/2022 1100)  vancomycin (VANCOCIN) IVPB 1000 mg/200 mL premix (has no administration in time range)  lactated ringers bolus 500 mL (0 mLs Intravenous Stopped 02/07/2022 1039)  ceFEPIme (MAXIPIME) 2 g in sodium chloride 0.9 % 100 mL IVPB (0 g Intravenous Stopped 01/26/2022 1025)    ED Hamilton/ Medical Decision Making/ Hamilton&P This patient with Hamilton Hx of multiple medical issues including alcohol abuse, recent diagnosis of discitis, now on IV antibiotics presents to the ED for concern of altered mental status, respiratory distress., this involves an extensive number of treatment options, and is Hamilton complaint that carries with it Hamilton high risk of complications and morbidity.    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, chronic infection requiring IV catheter provided antibiotics  Additional history obtained:  Additional history and/or information obtained from EMS and chart review, notable for EMS details included above, chart review notable for discharge summary as below: 76 year old with past medical history significant for COPD, CAD status post PCI to mid LAD 09/2020, Hamilton-fib status post cardioversion 3/23 hypertension, CKD stage IIIb, tobacco use, polycythemia vera, chronic opioid use on Suboxone, 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   Neurosurgery was consulted who recommended IR consultation for drainage and culture of fluid.  Patient underwent biopsy 6/12, antibiotics started.  Hospital Hamilton has also been complicated by hypoxia and acute kidney injury secondary to urinary retention.     After the initial evaluation, orders, including: Labs x-ray BiPAP were initiated.   Patient placed on Cardiac and Pulse-Oximetry Monitors. The patient was maintained on Hamilton cardiac monitor.  The cardiac monitored showed an rhythm of 80 sinus normal The patient was also maintained on pulse oximetry.  The readings were typically 93% with nonrebreather mask abnormal   On repeat evaluation of the patient improved Patient now off BiPAP, high flow oxygen 93%, he continues to awaken easily, answers questions appropriately now  12:20 PM Patient continues to have substantial clinical improvement, but numerically has made much progress.  Patient mains slightly hypotensive, now starting Levophed given his history of heart failure, and after he was on nasal cannula, he is now returning to nonrebreather mask. Lab Tests:  I personally interpreted labs.  The pertinent results include:  Acute kidney injury.  In comparison to the patient's renal function prior to discharge which had improved, he now has an elevated creatinine similar to that on prior admission.  There is mild hepatic dysfunction as well  Imaging Studies ordered:  I independently visualized and interpreted imaging which showed pneumonia, left-sided, pleural effusion I agree with the radiologist interpretation  Consultations Obtained:  I requested consultation with the critical care medicine,  and discussed lab and imaging findings as well as pertinent plan - they recommend: Admission  Dispostion / Final MDM:  After consideration of the diagnostic results and the patient's response to treatment, this adult male with recent hospitalization during which she was diagnosed with discitis,  now on IV antibiotic regimen presents from nursing facility with listlessness, hypoxia.  Patient found to have left-sided pleural effusion with pneumonia, and initial interventions in the ED included BiPAP.  Patient had marked improvement, was awake, alert, speaking regularly.  Patient's labs consistent with acute kidney injury, but given the patient's heart failure with prior EF 35% fluids were provided judiciously.  MAP remained above 70 throughout ED Hamilton.  Patient was designated as Hamilton code sepsis and had broader antibiotics provided on arrival, I discussed this with our pharmacist.  I discussed his care with our critical care team for admission  Final Clinical Impression(s) / ED Diagnoses Final diagnoses:  Respiratory distress  HCAP (healthcare-associated pneumonia)  CRITICAL CARE Performed by: Carmin Muskrat Total critical care time: 45 minutes Critical care time was exclusive of separately billable procedures and treating other patients. Critical care was necessary to treat or prevent imminent or life-threatening deterioration. Critical care was time spent personally by me on the following activities: development of treatment plan with patient and/or surrogate as well as nursing, discussions with consultants, evaluation of patient's response to treatment, examination of patient, obtaining history from patient or surrogate, ordering and performing treatments and interventions, ordering and review of laboratory studies, ordering and review of radiographic studies, pulse oximetry and re-evaluation of patient's condition.    Carmin Muskrat, MD 01/17/2022 1324

## 2022-01-22 NOTE — Sepsis Progress Note (Signed)
eLink monitoring code sepsis.  

## 2022-01-23 ENCOUNTER — Inpatient Hospital Stay (HOSPITAL_COMMUNITY): Payer: Medicare Other

## 2022-01-23 DIAGNOSIS — L8990 Pressure ulcer of unspecified site, unspecified stage: Secondary | ICD-10-CM | POA: Insufficient documentation

## 2022-01-23 DIAGNOSIS — J449 Chronic obstructive pulmonary disease, unspecified: Secondary | ICD-10-CM

## 2022-01-23 DIAGNOSIS — R7989 Other specified abnormal findings of blood chemistry: Secondary | ICD-10-CM

## 2022-01-23 DIAGNOSIS — N1832 Chronic kidney disease, stage 3b: Secondary | ICD-10-CM

## 2022-01-23 DIAGNOSIS — I4811 Longstanding persistent atrial fibrillation: Secondary | ICD-10-CM

## 2022-01-23 LAB — COMPREHENSIVE METABOLIC PANEL
ALT: 92 U/L — ABNORMAL HIGH (ref 0–44)
AST: 116 U/L — ABNORMAL HIGH (ref 15–41)
Albumin: 1.8 g/dL — ABNORMAL LOW (ref 3.5–5.0)
Alkaline Phosphatase: 86 U/L (ref 38–126)
Anion gap: 15 (ref 5–15)
BUN: 36 mg/dL — ABNORMAL HIGH (ref 8–23)
CO2: 25 mmol/L (ref 22–32)
Calcium: 8.4 mg/dL — ABNORMAL LOW (ref 8.9–10.3)
Chloride: 105 mmol/L (ref 98–111)
Creatinine, Ser: 1.99 mg/dL — ABNORMAL HIGH (ref 0.61–1.24)
GFR, Estimated: 35 mL/min — ABNORMAL LOW (ref >60)
Glucose, Bld: 130 mg/dL — ABNORMAL HIGH (ref 70–99)
Potassium: 4.3 mmol/L (ref 3.5–5.1)
Sodium: 145 mmol/L (ref 135–145)
Total Bilirubin: 1.2 mg/dL (ref 0.3–1.2)
Total Protein: 6.7 g/dL (ref 6.5–8.1)

## 2022-01-23 LAB — CBC
HCT: 40.9 % (ref 39.0–52.0)
Hemoglobin: 12.8 g/dL — ABNORMAL LOW (ref 13.0–17.0)
MCH: 30.1 pg (ref 26.0–34.0)
MCHC: 31.3 g/dL (ref 30.0–36.0)
MCV: 96.2 fL (ref 80.0–100.0)
Platelets: 258 10*3/uL (ref 150–400)
RBC: 4.25 MIL/uL (ref 4.22–5.81)
RDW: 17.1 % — ABNORMAL HIGH (ref 11.5–15.5)
WBC: 14.8 10*3/uL — ABNORMAL HIGH (ref 4.0–10.5)
nRBC: 0 % (ref 0.0–0.2)

## 2022-01-23 LAB — GLUCOSE, CAPILLARY
Glucose-Capillary: 122 mg/dL — ABNORMAL HIGH (ref 70–99)
Glucose-Capillary: 124 mg/dL — ABNORMAL HIGH (ref 70–99)
Glucose-Capillary: 127 mg/dL — ABNORMAL HIGH (ref 70–99)
Glucose-Capillary: 132 mg/dL — ABNORMAL HIGH (ref 70–99)
Glucose-Capillary: 142 mg/dL — ABNORMAL HIGH (ref 70–99)
Glucose-Capillary: 156 mg/dL — ABNORMAL HIGH (ref 70–99)

## 2022-01-23 LAB — URINE CULTURE: Culture: NO GROWTH

## 2022-01-23 LAB — MAGNESIUM
Magnesium: 1.9 mg/dL (ref 1.7–2.4)
Magnesium: 2.2 mg/dL (ref 1.7–2.4)
Magnesium: 2.1 mg/dL (ref 1.7–2.4)

## 2022-01-23 LAB — MRSA NEXT GEN BY PCR, NASAL: MRSA by PCR Next Gen: DETECTED — AB

## 2022-01-23 LAB — PHOSPHORUS
Phosphorus: 3.3 mg/dL (ref 2.5–4.6)
Phosphorus: 3.4 mg/dL (ref 2.5–4.6)

## 2022-01-23 LAB — PROTIME-INR
INR: 2.7 — ABNORMAL HIGH (ref 0.8–1.2)
Prothrombin Time: 28.1 seconds — ABNORMAL HIGH (ref 11.4–15.2)

## 2022-01-23 MED ORDER — SODIUM CHLORIDE 0.9% FLUSH
10.0000 mL | Freq: Two times a day (BID) | INTRAVENOUS | Status: DC
  Administered 2022-01-23 – 2022-01-29 (×10): 10 mL

## 2022-01-23 MED ORDER — ORAL CARE MOUTH RINSE: 15.0000 mL | OROMUCOSAL | Status: DC | PRN

## 2022-01-23 MED ORDER — MAGNESIUM SULFATE IN D5W 1-5 GM/100ML-% IV SOLN
1.0000 g | Freq: Once | INTRAVENOUS | Status: AC
  Administered 2022-01-23: 1 g via INTRAVENOUS
  Filled 2022-01-23: qty 100

## 2022-01-23 MED ORDER — NOREPINEPHRINE 4 MG/250ML-% IV SOLN
2.0000 ug/min | INTRAVENOUS | Status: DC
  Administered 2022-01-23 – 2022-01-24 (×2): 5 ug/min via INTRAVENOUS
  Administered 2022-01-24: 8 ug/min via INTRAVENOUS
  Filled 2022-01-23 (×3): qty 250

## 2022-01-23 MED ORDER — PANTOPRAZOLE 2 MG/ML SUSPENSION
40.0000 mg | Freq: Every day | ORAL | Status: DC
  Administered 2022-01-23 – 2022-01-24 (×2): 40 mg
  Filled 2022-01-23 (×3): qty 20

## 2022-01-23 MED ORDER — MUPIROCIN 2 % EX OINT
1.0000 | TOPICAL_OINTMENT | Freq: Two times a day (BID) | CUTANEOUS | Status: AC
  Administered 2022-01-23 – 2022-01-27 (×10): 1 via NASAL
  Filled 2022-01-23: qty 22

## 2022-01-23 MED ORDER — LACTATED RINGERS IV BOLUS (SEPSIS)
500.0000 mL | Freq: Once | INTRAVENOUS | Status: AC
  Administered 2022-01-23: 500 mL via INTRAVENOUS

## 2022-01-23 MED ORDER — SODIUM CHLORIDE 0.9 % IV SOLN
250.0000 mL | INTRAVENOUS | Status: DC
  Administered 2022-01-23: 250 mL via INTRAVENOUS

## 2022-01-23 MED ORDER — ORAL CARE MOUTH RINSE
15.0000 mL | OROMUCOSAL | Status: DC
  Administered 2022-01-23 (×3): 15 mL via OROMUCOSAL

## 2022-01-23 MED ORDER — VITAL AF 1.2 CAL PO LIQD
1000.0000 mL | ORAL | Status: DC
  Administered 2022-01-23 – 2022-01-24 (×2): 1000 mL
  Filled 2022-01-23 (×2): qty 1000

## 2022-01-23 MED ORDER — ATROPINE SULFATE 1 MG/10ML IJ SOSY
PREFILLED_SYRINGE | INTRAMUSCULAR | Status: AC
  Filled 2022-01-23: qty 10

## 2022-01-23 MED ORDER — ORAL CARE MOUTH RINSE
15.0000 mL | OROMUCOSAL | Status: DC
Start: 2022-01-23 — End: 2022-01-25
  Administered 2022-01-23 – 2022-01-25 (×26): 15 mL via OROMUCOSAL

## 2022-01-23 MED ORDER — ENOXAPARIN SODIUM 100 MG/ML IJ SOSY
1.0000 mg/kg | PREFILLED_SYRINGE | Freq: Two times a day (BID) | INTRAMUSCULAR | Status: DC
  Administered 2022-01-23 – 2022-01-25 (×5): 90 mg via SUBCUTANEOUS
  Filled 2022-01-23 (×5): qty 0.9

## 2022-01-23 MED ORDER — SODIUM CHLORIDE 0.9% FLUSH: 10.0000 mL | INTRAVENOUS | Status: DC | PRN

## 2022-01-23 MED ORDER — JUVEN PO PACK
1.0000 | PACK | Freq: Two times a day (BID) | ORAL | Status: DC
  Administered 2022-01-23 – 2022-01-24 (×3): 1
  Filled 2022-01-23 (×5): qty 1

## 2022-01-23 NOTE — Progress Notes (Signed)
An USGPIV (ultrasound guided PIV) has been placed for short-term vasopressor infusion. A correctly placed ivWatch must be used when administering Vasopressors. Should this treatment be needed beyond 72 hours, central line access should be obtained.  It will be the responsibility of the bedside nurse to follow best practice to prevent extravasations.   ?

## 2022-01-23 NOTE — Progress Notes (Addendum)
NAME:  Austan Nicholl, MRN:  [id number], DOB:  July 05, 1946, LOS: 1 ADMISSION DATE:  02/11/2022, CONSULTATION DATE:  01/23/2022 REFERRING MD:  Dr. Vanita Panda, CHIEF COMPLAINT:  SOB/ hypoxia   History of Present Illness:  HPI obtained from medical chart review given encephalopathy.   76 year old male with extensive hx as below, most recently hospitalized 6/9 to 01/05/22 for L3-L4 discitis/ osteomyelitis discharged on 6 week course of Teflaro presenting from SNF with complaints of SOB, hypoxia with sats 60% on room air, and altered mental status.    In ER, initially afebrile, HR 80's, normotensive, and on NRB with sats 88> placed on BiPAP.  Workup noted for CXR with new left moderate pleural effusion with collapse vs consolidation, Na 147, sCr 2.17, AST/ ALT 103/ 80, INR 3.4, WBC 13.6, normal lactic, trop hs 22, BNP 314, and ABG 7.44/ 48/ 63/ 33.  Became progressively hypotensive and placed on norepinephrine.  PCCM called for ICU admit.   Pertinent  Medical History  Tobacco abuse, COPD, CAD w/ prior PCI to mid LAD 3/22, afib on Eliquis, HTN, CKDIIIb, polycythemia vera, chronic opioid use on Suboxone, combined HF, severe MR s/p TAVR 5/23 with mitraclip, severe TR/ AI, moderate aortic regurgitation, L3-L4 discitis/ osteomyelitis   Significant Hospital Events: Including procedures, antibiotic start and stop dates in addition to other pertinent events   Admitted presumed septic shock/ left pna, hypoxic, NE   Interim History / Subjective:  Intubated yesterday afternoon due to respiratory failure. Runs of Afib with RVR ON, got amiodarone bolus. Converted to NSR  Objective   Blood pressure (!) 108/51, pulse 67, temperature (!) 100.9 F (38.3 C), temperature source Oral, resp. rate 20, height '5\' 6"'$  (1.676 m), weight 90.7 kg, SpO2 97 %.    Vent Mode: PRVC FiO2 (%):  [70 %-100 %] 70 % Set Rate:  [20 bmp-22 bmp] 20 bmp Vt Set:  [510 mL] 510 mL PEEP:  [10 cmH20] 10 cmH20 Plateau Pressure:  [23  cmH20-24 cmH20] 23 cmH20   Intake/Output Summary (Last 24 hours) at 01/23/2022 0722 Last data filed at 01/23/2022 0500 Gross per 24 hour  Intake 6003.59 ml  Output 725 ml  Net 5278.59 ml    Filed Weights   02/11/2022 0925 02/16/2022 1736 01/23/22 0500  Weight: 108 kg 91.8 kg 90.7 kg   Examination: General:  chronically ill; intubated but alert, following commands HEENT: MM tacky, sclerae anicteric, EOMs intact Neuro: alert, follows commands, moves all extremities CV: irregularly irregular, normal rate, 2/6 systolic murmur, W1/U2 PULM:  ventilated, left apex diminished, no lung sounds in base. Right side clear with good air movement.  GI: distended but not rigid, non-tender Extremities: without edema or deformity   Resolved Hospital Problem list    Assessment & Plan:   Shock, presumed septic. WBC 14.1 this am. Febrile to 100.9 this am as well.  Exact source remains unclear. UA with trace leukocytes, suspected LLL pna, and recent L3-L4 discitis/ osteomyelitis, r/o bacteremia. Blood cx and urine cx with NGTD - remains on levophed for MAP support - CT Chest/Abd/Pelv to get a better look at lungs and lumbar spine to evaluate for source - continue cefepime/ vanc for now.  Check MRSA PCR.   - ID consult to discuss abx given has been on ceftaroline  - follow BC and UC -F/u urine strep antigen - OK to start tube feeds  Hypoxic and mild hypercapnic respiratory failure ? Left pleural effusion/ suspected LLL PNA Hx COPD Intubated. Titrating down FiO2,  down to 70%.    - abx as above; CT as above - prn BD, brovanna/ pulmicort in place of home symbicort   Hx Afib on Eliquis/ amio Runs of Afib with RVR ON, got amiodarone bolus, converted to NSR. Now back in A fib but rate controlled at this time. Runs of PVCs with compensatory pauses ON.  - Holding Eliquis, will transition to lovenox  - if RVR recurs, will re-bolus Amio and leave on gtt - keep K>4, Mag > 2  AKI Hypernatremia  Likely  prerenal in the setting of hypoperfusion/shock. Cr 2.17>1.99. Recent BL around 1.2. 225 mL UOP ON. Still looks fluid down - Continue maintenance fluids; repeat 569m bolus - cont foley - Trend BMP / urinary output - Replace electrolytes as indicated - Avoid nephrotoxic agents, ensure adequate renal perfusion  Elevated liver enzymes Coagulapathy  INR 3.4>2.7. AST 116, ALT 92. Liver enzymes WNL two weeks ago, likely shock liver.  - ammonia wnl/ 18 - INR 3.4 - Trend LFTs/INR    Acute encephalopathy Multifactorial in the setting of hypoxia, sepsis.  - serial neuro exams, currently nonfocal   Combined HF w/ recovered EF 55-60% CAD w/ prior CABG HTN - tele monitoring  - hold home toprol and torsemide while on pressors  - hold lipitor with elevated LFTs  Prior L3-L4 discitis/ osteomyelitis  - has R tunneled CVL for 6 week course of teflaro (stop date 02/05/22) - abx per ID as above  Chronic pain - hold pta suboxone, norco and gabapentin for now while altered; getting fentanyl for sedation  Severe MR s/p TAVR 5/23 with mitraclip, severe TR/ AI, moderate aortic regurgitation - outpt cards f/u   Aortic dilation  - semi annual CTAs per cardiology  Best Practice (right click and "Reselect all SmartList Selections" daily)   Diet/type: NPO DVT prophylaxis: SCD GI prophylaxis: N/A Lines: Central line- PTA RIJ tunneled single lumen CVL Foley:  Yes, and it is still needed Code Status:  full code Last date of multidisciplinary goals of care discussion [pending]  BPearla Dubonnet MD  If no response to pager, please call PCCM consult pager After 7:00 pm call Elink

## 2022-01-23 NOTE — Progress Notes (Signed)
Initial Nutrition Assessment  DOCUMENTATION CODES:   Not applicable  INTERVENTION:   Tube feeds via OG: Start Vital AF 1.2 @ 20 mL/hr and advance by 10 mL q6h to goal rate of 70 mL/hr (1680 mL/day) Provides 2016 kcal, 126 gm protein, and 1362 mL free water daily.  Monitor magnesium, potassium, and phosphorus BID for at least 3 days, MD to replete as needed, as pt is at risk for refeeding syndrome given wounds and recent admission to the hospital, then SNF. 1 packet Juven BID, each packet provides 95 calories, 2.5 grams of protein (collagen), and 9.8 grams of carbohydrate (3 grams sugar); also contains 7 grams of L-arginine and L-glutamine, 300 mg vitamin C, 15 mg vitamin E, 1.2 mcg vitamin B-12, 9.5 mg zinc, 200 mg calcium, and 1.5 g  Calcium Beta-hydroxy-Beta-methylbutyrate to support wound healing  NUTRITION DIAGNOSIS:   Inadequate oral intake related to inability to eat as evidenced by NPO status.  GOAL:   Patient will meet greater than or equal to 90% of their needs  MONITOR:   Vent status, Labs, TF tolerance, I & O's  REASON FOR ASSESSMENT:   Ventilator, Consult Enteral/tube feeding initiation and management  ASSESSMENT:   76 y.o. male presented to the ED from SNF due to SOB and altered mental status. Pt recently admitted 6/9-6/19 for L2-L4 discitis/osteomyelitis. PMH includes COPD, tobacco use, A. Fib, HTN, CHF, NSTEMI, and CKD IIIb. Pt admitted with septic shock.   7/06 - intubated  Discussed case with RN; ok to start TF. OG tube tip and side port within stomach.   Patient is currently intubated on ventilator support. MV: 10.2 L/min Temp (24hrs), Avg:99.1 F (37.3 C), Min:96.9 F (36.1 C), Max:100.9 F (38.3 C)  Drips: Fentanyl Levophed  Per EMR, pt weight has fluctuated over the past 8 months, but has remained steady over the last 2 months.   Medications reviewed and include: Colace, Protonix, Miralax, IV antibiotics  Labs reviewed: BUN 36, Creatinine 1.99    NUTRITION - FOCUSED PHYSICAL EXAM:  Flowsheet Row Most Recent Value  Orbital Region No depletion  Upper Arm Region No depletion  Thoracic and Lumbar Region No depletion  Buccal Region Unable to assess  Temple Region No depletion  Clavicle Bone Region Mild depletion  Clavicle and Acromion Bone Region Mild depletion  Scapular Bone Region Mild depletion  Dorsal Hand Unable to assess  Patellar Region No depletion  Anterior Thigh Region No depletion  Posterior Calf Region Mild depletion  Edema (RD Assessment) None  Hair Reviewed  Eyes Unable to assess  Mouth Unable to assess  Skin Reviewed  Nails Unable to assess   Diet Order:   Diet Order             Diet NPO time specified  Diet effective now                   EDUCATION NEEDS:   Not appropriate for education at this time  Skin:  Skin Assessment: Skin Integrity Issues: Skin Integrity Issues:: DTI, Stage II DTI: L & R heel Stage II: Buttocks  Last BM:  7/6  Height:   Ht Readings from Last 1 Encounters:  01/17/2022 '5\' 6"'$  (1.676 m)    Weight:   Wt Readings from Last 1 Encounters:  01/23/22 90.7 kg    Ideal Body Weight:  64.6 kg  BMI:  Body mass index is 32.27 kg/m.  Estimated Nutritional Needs:   Kcal:  1900-2100  Protein:  95-110 grams  Fluid:  >/= 1.9 L    Hermina Barters RD, LDN Clinical Dietitian See [facility] for contact information.

## 2022-01-23 NOTE — Progress Notes (Signed)
Pt was on 100% and peep of 10. Slowly titrated O2. Currently on 70% sats are stable at 98% at this time. RT will continue to monitor.

## 2022-01-23 NOTE — Progress Notes (Signed)
Per RN, Levophed is infusing via PICC line.

## 2022-01-23 NOTE — Progress Notes (Signed)
[facility] Progress Note Patient Name: [name] DOB: 1946/05/29 MRN: [id number]   Date of Service  01/23/2022  HPI/Events of Note  Nursing reports frequent PVCs and pauses. Review of bedside monitor reveals patient in and out of AFIB with controlled ventricular response with ventricular rate 78 to 110 and PAC's with compensatory pauses. K+ = 4.3, Mg++ = 1.9 and Creatinine = 1.99.  eICU Interventions  Will replace Mg++.     Intervention Category Major Interventions: Arrhythmia - evaluation and management  Shantanu Strauch [place] 01/23/2022, 1:31 AM

## 2022-01-23 NOTE — Consult Note (Signed)
[name]    Date of Admission:  01/19/2022     Reason for Consult: Sepsis     Referring Physician: Dr Halford Chessman  Current antibiotics: Cefepime Vancomycin   ASSESSMENT:    76 y.o. male admitted with:  Septic shock: Unclear source at this time.  Suspect likely related to #2, possibly #3.  Other potential etiology could be secondary to pneumonia or central line blood stream infection. Discitis/OM L3-4: Noted on MRI 12/26/21 during last admission and s/p disc aspriation 6/12 with negative cultures.  Initially treated with ceftriaxone and daptomycin then ceftaroline monotherapy due to concerns for CK elevation with daptomycin.  He was transitioned to doxycycline and ceftriaxone at his SNF on 01/17/22 due to cost of ceftaroline.  Planned antibiotic end date of 02/05/22 for this infection. Right psoas inflammatory edema: Noted on MRI 12/26/21.  No psoas abscess noted at that time. Acute kidney injury: in the setting of known CKD 3.  Creatinine improved today. Acute hypoxic respiratory failure:  Occurring with history of COPD.  Intubated 02/14/2022. Atrial fibrillation Elevated LFTs: Likely due to shock liver.  History of positive HCV Ab, negative RNA with normal LFTs 2 weeks ago.  RECOMMENDATIONS:    Continue vancomycin dosed for renal function Continue cefepime dosed for renal function.  If cultures unremarkable, hopefully can narrow back to ceftriaxone. Agree with CT Chest/Abdomen/Pelvis as this will better evaluate pulmonary issues and potential for thoracentesis if there is a large enough pleural effusion.  Will also evaluate for possible psoas abscess formation Will repeat MRI lumbar spine to ensure no development of epidural abscess Okay to maintain tunneled line for now although would have low threshold to have removed.  If bacteremia present, then will proceed with removal ASAP His imaging does not appear consistent with daptomycin induced pneumonitis and he only  received 1 dose on 12/29/21 making this even more unlikely Monitor left forearm for any progression and need for imaging Follow up cultures Lab monitoring Will follow, Dr Gale Journey here this weekend.    Principal Problem:   Septic shock ([place]) Active Problems:   Acute respiratory failure with hypoxia and hypercapnia (HCC)   COPD (chronic obstructive pulmonary disease) (HCC)   Acute kidney injury (HCC)   Discitis of lumbar region   Pressure injury of skin   MEDICATIONS:    Scheduled Meds:  arformoterol  15 mcg Nebulization BID   atropine       budesonide (PULMICORT) nebulizer solution  0.5 mg Nebulization BID   Chlorhexidine Gluconate Cloth  6 each Topical Daily   docusate  100 mg Per Tube BID   enoxaparin (LOVENOX) injection  1 mg/kg Subcutaneous Q12H   mupirocin ointment  1 Application Nasal BID   mouth rinse  15 mL Mouth Rinse Q2H   pantoprazole (PROTONIX) IV  40 mg Intravenous Q24H   polyethylene glycol  17 g Per Tube Daily   sodium chloride flush  10-40 mL Intracatheter Q12H   Continuous Infusions:  sodium chloride     ceFEPime (MAXIPIME) IV 2 g (01/23/22 1034)   fentaNYL infusion INTRAVENOUS 175 mcg/hr (01/23/22 0754)   lactated ringers     norepinephrine (LEVOPHED) Adult infusion 3 mcg/min (01/23/22 1035)   vancomycin 1,000 mg (01/23/22 0926)   PRN Meds:.albuterol, atropine, fentaNYL, midazolam, midazolam, sodium chloride flush  HPI:    [name] is a 76 y.o. male with PMHx of atrial fibrillation, severe MR s/p MV clipping May 2023, COPD, GERD, CKD 3b, polycythemia recently  admitted last month for worsening low back pain x 4-6 weeks and found to have leukocytosis with MRI back concerning for discitis/OM with out epidural abscess.  He was started on antibiotics at that time and also underwent aspiration on 6/12.  Cultures were negative.   He was treated with Ceftriaxone and Daptomycin empirically.  This was changed to ceftaroline due to concern for elevated CK  while on Daptomycin.  He was discharged to rehab on 6/19 with OPAT orders for Ceftaroloine through 02/05/22 via a tunneled central line given his CKD.  He saw Dr Linus Salmons for follow up on 01/16/22.  There were no major concerns that day and plan for antibiotics through 7/20 was planned.  Of note, his SNF stopped giving him ceftaroline at the beginning of the month due to cost.  He was thus switched to doxycycline and ceftriaxone.  He is now presenting from his SNF yesterday with complaints of SOB, hypoxia, and altered mental status.  Upon evaluation in the ED, he was initially afebrile and normotensive.  He was placed on Bipap and imaging showed a new left moderate pleural effusion.  He became more hypotensive requiring vasopressors.  He also required intubation in the setting of his respiratory failure. We are asked to see today for antibiotic recommendations.    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 Ali Lowe   Severe mitral regurgitation    Substance abuse ([place])    History of alcohol; hx cocaine, heroin, crack use   Syncope    Tubular adenoma of colon 08/2014    Social History   Tobacco Use   Smoking status: Former    Packs/day: 2.00    Years: 50.00    Total pack years: 100.00     Types: Cigarettes    Quit date: 11/25/2021    Years since quitting: 0.1   Smokeless tobacco: Never   Tobacco comments:    1-2 cigarettes a day for a couple years now. encouraged cessation  Vaping Use   Vaping Use: Never used  Substance Use Topics   Alcohol use: Not Currently    Alcohol/week: 2.0 standard drinks of alcohol    Types: 2 Shots of liquor per week   Drug use: Not Currently    Types: Heroin    Comment: stopped use in 2015. currently on suboxone    Family History  Problem Relation Age of Onset   Diabetes Mother    Hypertension Mother    Cirrhosis Father    Alcohol abuse Father    Colon cancer Neg Hx    Rectal cancer Neg Hx    Stomach cancer Neg Hx    Esophageal cancer Neg  Hx    Colon polyps Neg Hx     No Known Allergies  Review of Systems  Unable to perform ROS: Intubated    OBJECTIVE:   Blood pressure (!) 108/51, pulse 67, temperature 100 F (37.8 C), temperature source Oral, resp. rate 20, height '5\' 6"'$  (1.676 m), weight 90.7 kg, SpO2 96 %. Body mass index is 32.27 kg/m.  Physical Exam Constitutional:      Comments: Ill appearing, intubated and sedated.   HENT:     Head: Normocephalic and atraumatic.     Mouth/Throat:     Comments: ET Tube in place. Eyes:     Comments: Eyes are closed.   Cardiovascular:     Rate and Rhythm: Normal rate. Rhythm irregular.  Pulmonary:     Comments: Intubated breath sounds.  Appears comfortable on the vent.  Breath sounds are diminished at bases with left basilar crackles.  Abdominal:     General: There is distension.     Palpations: Abdomen is soft.     Tenderness: There is no abdominal tenderness. There is no guarding or rebound.  Musculoskeletal:        General: Swelling present.     Cervical back: Normal range of motion and neck supple.     Right lower leg: No edema.     Left lower leg: No edema.     Comments: Left forearm with small area of swelling, minimal induration, mild erythema.  This area has been  marked with black ink.  Skin:    General: Skin is warm and dry.  Neurological:     Comments: Sedated.  Withdraws from pain.       Lab Results: Lab Results  Component Value Date   WBC 14.8 (H) 01/23/2022   HGB 12.8 (L) 01/23/2022   HCT 40.9 01/23/2022   MCV 96.2 01/23/2022   PLT 258 01/23/2022    Lab Results  Component Value Date   NA 145 01/23/2022   K 4.3 01/23/2022   CO2 25 01/23/2022   GLUCOSE 130 (H) 01/23/2022   BUN 36 (H) 01/23/2022   CREATININE 1.99 (H) 01/23/2022   CALCIUM 8.4 (L) 01/23/2022   GFRNONAA 35 (L) 01/23/2022   GFRAA 61 07/16/2020    Lab Results  Component Value Date   ALT 92 (H) 01/23/2022   AST 116 (H) 01/23/2022   ALKPHOS 86 01/23/2022   BILITOT 1.2 01/23/2022       Component Value Date/Time   CRP 15.9 (H) 12/27/2021 1917       Component Value Date/Time   ESRSEDRATE 31 (H) 12/27/2021 1917    I have reviewed the micro and lab results in Epic.  Imaging: DG CHEST PORT 1 VIEW  Result Date: 01/23/2022 CLINICAL DATA:  Intubation EXAM: PORTABLE CHEST 1 VIEW COMPARISON:  Portable exam 0746 hours compared to 02/02/2022 FINDINGS: Tip of endotracheal tube projects 3.4 cm above carina. Tip of nasogastric tube projects over stomach. RIGHT jugular line tip projects over SVC. Loop recorder projects over LEFT chest. Enlargement of cardiac silhouette with atherosclerotic calcification aortic arch. Volume loss in LEFT hemithorax with mild mediastinal shift to LEFT. Persistent opacification of the inferior LEFT hemithorax, greater at base, question atelectasis versus consolidation. Probable LEFT pleural effusion. RIGHT lung remains grossly clear. IMPRESSION: Persistent opacification of the LEFT lung especially inferiorly question atelectasis versus infiltrate. Probable LEFT pleural effusion. Improved aeration LEFT upper lobe versus prior study. Electronically Signed   By: Lavonia Dana M.D.   On: 01/23/2022 08:31  DG Abd Portable 1V  Result Date:  02/14/2022 CLINICAL DATA:  Encounter for OG tube placement EXAM: PORTABLE ABDOMEN - 1 VIEW COMPARISON:  None Available. FINDINGS: Orogastric tube tip and side port overlie the stomach. Left lower lung opacifications, see separately dictated radiograph of the chest. Nonobstructive bowel gas pattern. No acute osseous abnormality. IMPRESSION: Orogastric tube tip and side port overlie the stomach. Electronically Signed   By: Maurine Simmering M.D.   On: 01/25/2022 19:02   DG CHEST PORT 1 VIEW  Result Date: 01/18/2022 CLINICAL DATA:  Endotracheal tube adjustment EXAM: PORTABLE CHEST 1 VIEW COMPARISON:  Same day chest radiograph FINDINGS: Endotracheal tube is been retracted, now overlies the trachea approximately 2.6 cm above the carina. There is persistent slight leftward mediastinal shift. There is persistent left lung opacification, with slight improved aeration in the left apex. Right lung is clear. No pneumothorax. Right neck catheter tip overlies the mid superior vena cava. Orogastric tube side port overlies the left upper abdomen. IMPRESSION: Endotracheal tube has been retracted, now overlies the trachea approximately 2.6 cm above the carina. Persistent left lung opacification, with slight improved aeration in the left apex. Electronically Signed   By: Maurine Simmering M.D.   On: 01/23/2022 18:50   DG Chest 1 View  Result Date: 02/05/2022 CLINICAL DATA:  Endotracheal tube placement EXAM: CHEST  1 VIEW COMPARISON:  Radiograph 01/25/2022 FINDINGS: Endotracheal tube tip is at the carina. Orogastric tube side port overlies the stomach. Right neck catheter tip overlies the mid superior vena cava. Obscured cardiomediastinal silhouette with leftward shift. There is new complete opacification of the entire left hemithorax, with probable new left upper lobe and persistent left lower lung collapse and moderate-sized effusion. Right lung is clear. No pneumothorax. No acute osseous abnormality. Loop recorder overlies the left lower  chest. Bulky right glenohumeral osteophyte formation. IMPRESSION: Endotracheal tube is at the carina, recommend retraction by 3.0 cm. New complete opacification of the left lung, likely new upper lung collapse in addition to previously seen left lower lobe collapse and moderate size left pleural effusion. At the time of this dictation, the endotracheal tube had been retracted, as seen on subsequently acquired chest radiograph. Electronically Signed   By: Maurine Simmering M.D.   On: 01/28/2022 18:48   DG Chest Portable 1 View  Result Date: 01/26/2022 CLINICAL DATA:  Respiratory distress. EXAM: PORTABLE CHEST 1 VIEW COMPARISON:  Chest x-ray dated December 30, 2021. FINDINGS: New tunneled right internal jugular central venous catheter with tip in the distal SVC. Unchanged loop recorder and mild cardiomegaly. New moderate left pleural effusion with lingula and left lower lobe collapse and/or consolidation. Right lung is clear. No pneumothorax. No acute osseous abnormality. IMPRESSION: 1. New moderate left pleural effusion with lingula and left lower lobe collapse and/or consolidation. Electronically Signed   By: Titus Dubin M.D.   On: 01/21/2022 09:35     Imaging independently reviewed in Epic.  [facility] [phone] pager 01/23/2022, 11:00 AM

## 2022-01-23 NOTE — Progress Notes (Signed)
Patient transported on the ventilator to CT & back to room 2M14 with no problems.

## 2022-01-24 ENCOUNTER — Encounter (HOSPITAL_COMMUNITY): Payer: Self-pay

## 2022-01-24 ENCOUNTER — Inpatient Hospital Stay (HOSPITAL_COMMUNITY): Payer: Medicare Other

## 2022-01-24 LAB — CBC
HCT: 36.7 % — ABNORMAL LOW (ref 39.0–52.0)
Hemoglobin: 11.7 g/dL — ABNORMAL LOW (ref 13.0–17.0)
MCH: 30.3 pg (ref 26.0–34.0)
MCHC: 31.9 g/dL (ref 30.0–36.0)
MCV: 95.1 fL (ref 80.0–100.0)
Platelets: 265 10*3/uL (ref 150–400)
RBC: 3.86 MIL/uL — ABNORMAL LOW (ref 4.22–5.81)
RDW: 17.2 % — ABNORMAL HIGH (ref 11.5–15.5)
WBC: 15.2 10*3/uL — ABNORMAL HIGH (ref 4.0–10.5)
nRBC: 0 % (ref 0.0–0.2)

## 2022-01-24 LAB — BASIC METABOLIC PANEL
Anion gap: 8 (ref 5–15)
BUN: 31 mg/dL — ABNORMAL HIGH (ref 8–23)
CO2: 27 mmol/L (ref 22–32)
Calcium: 8.1 mg/dL — ABNORMAL LOW (ref 8.9–10.3)
Chloride: 106 mmol/L (ref 98–111)
Creatinine, Ser: 1.58 mg/dL — ABNORMAL HIGH (ref 0.61–1.24)
GFR, Estimated: 46 mL/min — ABNORMAL LOW (ref >60)
Glucose, Bld: 158 mg/dL — ABNORMAL HIGH (ref 70–99)
Potassium: 3.9 mmol/L (ref 3.5–5.1)
Sodium: 141 mmol/L (ref 135–145)

## 2022-01-24 LAB — COMPREHENSIVE METABOLIC PANEL
ALT: 91 U/L — ABNORMAL HIGH (ref 0–44)
AST: 96 U/L — ABNORMAL HIGH (ref 15–41)
Albumin: 1.6 g/dL — ABNORMAL LOW (ref 3.5–5.0)
Alkaline Phosphatase: 84 U/L (ref 38–126)
Anion gap: 7 (ref 5–15)
BUN: 29 mg/dL — ABNORMAL HIGH (ref 8–23)
CO2: 25 mmol/L (ref 22–32)
Calcium: 7.4 mg/dL — ABNORMAL LOW (ref 8.9–10.3)
Chloride: 109 mmol/L (ref 98–111)
Creatinine, Ser: 1.5 mg/dL — ABNORMAL HIGH (ref 0.61–1.24)
GFR, Estimated: 49 mL/min — ABNORMAL LOW (ref >60)
Glucose, Bld: 119 mg/dL — ABNORMAL HIGH (ref 70–99)
Potassium: 3.7 mmol/L (ref 3.5–5.1)
Sodium: 141 mmol/L (ref 135–145)
Total Bilirubin: 1.1 mg/dL (ref 0.3–1.2)
Total Protein: 6.2 g/dL — ABNORMAL LOW (ref 6.5–8.1)

## 2022-01-24 LAB — GLUCOSE, CAPILLARY
Glucose-Capillary: 117 mg/dL — ABNORMAL HIGH (ref 70–99)
Glucose-Capillary: 128 mg/dL — ABNORMAL HIGH (ref 70–99)
Glucose-Capillary: 134 mg/dL — ABNORMAL HIGH (ref 70–99)
Glucose-Capillary: 141 mg/dL — ABNORMAL HIGH (ref 70–99)
Glucose-Capillary: 148 mg/dL — ABNORMAL HIGH (ref 70–99)
Glucose-Capillary: 149 mg/dL — ABNORMAL HIGH (ref 70–99)
Glucose-Capillary: 162 mg/dL — ABNORMAL HIGH (ref 70–99)

## 2022-01-24 LAB — PHOSPHORUS: Phosphorus: 3 mg/dL (ref 2.5–4.6)

## 2022-01-24 LAB — MAGNESIUM: Magnesium: 2.1 mg/dL (ref 1.7–2.4)

## 2022-01-24 NOTE — Progress Notes (Signed)
NAME:  [name], MRN:  [id number], DOB:  Feb 20, 1946, LOS: 2 ADMISSION DATE:  01/17/2022, CONSULTATION DATE:  01/19/2022 REFERRING MD:  Dr. Vanita Panda, CHIEF COMPLAINT:  SOB/ hypoxia   History of Present Illness:  76 yo male was in hospital from 6/09 to 6/19 with L3-4 discitis and osteomyelitis discharged on 6 weeks of teflaro sent from SNF with dyspnea and hypoxia with SpO2 60% on room air.  Found to have white out of Lt lung on xray.  Required intubation in ER.  Started on pressors and antibiotics.  PCCM consulted to admit to ICU.  Pertinent  Medical History  Tobacco abuse, COPD, CAD w/ prior PCI to mid LAD 3/22, afib on Eliquis, HTN, CKDIIIb, polycythemia vera, chronic opioid use on Suboxone, combined HF, severe MR s/p TAVR 5/23 with mitraclip, severe TR/ AI, moderate aortic regurgitation, L3-L4 discitis/ osteomyelitis   Significant Hospital Events: Including procedures, antibiotic start and stop dates in addition to other pertinent events   7/06 Admitted presumed septic shock/ left pna, hypoxic, NE  7/07 A fib with RVR >> amiodarone bolus  Interim History / Subjective:  Remains on vent, pressors, sedation.  Objective   Blood pressure (!) 89/58, pulse (!) 106, temperature 98.6 F (37 C), temperature source Axillary, resp. rate (!) 30, height '5\' 6"'$  (1.676 m), weight 91.5 kg, SpO2 100 %.    Vent Mode: PRVC FiO2 (%):  [60 %-70 %] 60 % Set Rate:  [16 bmp-20 bmp] 16 bmp Vt Set:  [510 mL] 510 mL PEEP:  [10 cmH20] 10 cmH20 Plateau Pressure:  [19 cmH20-24 cmH20] 21 cmH20   Intake/Output Summary (Last 24 hours) at 01/24/2022 0735 Last data filed at 01/24/2022 0600 Gross per 24 hour  Intake 4127.1 ml  Output 950 ml  Net 3177.1 ml   Filed Weights   01/26/2022 1736 01/23/22 0500 01/24/22 0500  Weight: 91.8 kg 90.7 kg 91.5 kg   Examination:  General - sedated Eyes - pupils reactive ENT - ETT in place Cardiac - irregular Chest - decreased breath sounds on Lt Abdomen - soft,  non tender, + bowel sounds Extremities - no cyanosis, clubbing, or edema Skin - no rashes Neuro - RASS -1, follows commands  Resolved Hospital Problem list    Assessment & Plan:   Septic shock. - most likely from HCAP, and possibly from L3-4 diskitis - MRI lumbar spine pending >> needs skull xray first to exclude metal - wean pressors to keep MAP > 65 - continue ABx per ID - follow up blood culture from 7/06  Acute hypoxic/hypercapnic respiratory failure. Lt lower lobar bacterial HCAP POA. Hx of COPD. - adjust PEEP/FiO2 to keep SpO2 > 92% - f/u CXR - wean on pressure support as tolerated - continue brovana, pulmicort   Hx of A fib. Valvular heart disease. CAD, Chronic diastolic CHF. - monitor on telemetry - full dose lovenox for now   Acute metabolic encephalopathy from sepsis. Chronic pain. - RASS goal 0 to -1   AKI from ATN 2nd to sepsis. - f/u BMET   Pressure injury. - Rt heel, deep tissue injury, not POA  Best Practice (right click and "Reselect all SmartList Selections" daily)   Diet/type: NPO DVT prophylaxis: SCD GI prophylaxis: N/A Lines: Central line- PTA RIJ tunneled single lumen CVL Foley:  Yes, and it is still needed Code Status:  full code Last date of multidisciplinary goals of care discussion [pending]  Labs:      Latest Ref Rng & Units 01/24/2022  3:29 AM 01/23/2022   12:25 AM 01/19/2022    6:37 PM  CMP  Glucose 70 - 99 mg/dL 119  130    BUN 8 - 23 mg/dL 29  36    Creatinine 0.61 - 1.24 mg/dL 1.50  1.99    Sodium 135 - 145 mmol/L 141  145  145   Potassium 3.5 - 5.1 mmol/L 3.7  4.3  4.2   Chloride 98 - 111 mmol/L 109  105    CO2 22 - 32 mmol/L 25  25    Calcium 8.9 - 10.3 mg/dL 7.4  8.4    Total Protein 6.5 - 8.1 g/dL 6.2  6.7    Total Bilirubin 0.3 - 1.2 mg/dL 1.1  1.2    Alkaline Phos 38 - 126 U/L 84  86    AST 15 - 41 U/L 96  116    ALT 0 - 44 U/L 91  92         Latest Ref Rng & Units 01/24/2022    3:29 AM 01/23/2022   12:25 AM  02/03/2022    6:37 PM  CBC  WBC 4.0 - 10.5 K/uL 15.2  14.8    Hemoglobin 13.0 - 17.0 g/dL 11.7  12.8  14.6   Hematocrit 39.0 - 52.0 % 36.7  40.9  43.0   Platelets 150 - 400 K/uL 265  258      ABG    Component Value Date/Time   PHART 7.508 (H) 01/21/2022 1837   PCO2ART 36.0 01/27/2022 1837   PO2ART 58 (L) 02/02/2022 1837   HCO3 28.6 (H) 02/05/2022 1837   TCO2 30 01/19/2022 1837   ACIDBASEDEF 0.8 12/31/2021 1019   O2SAT 92 01/27/2022 1837    CBG (last 3)  Recent Labs    01/23/22 1954 01/24/22 0001 01/24/22 0404  GLUCAP 132* 128* 117*    Critical care time: 36 minutes  Chesley Mires, MD [place] Pager - [phone] 01/24/2022, 7:45 AM

## 2022-01-24 NOTE — Progress Notes (Signed)
Assisted tele visit to patient with sister.  [name], Janace Hoard, RN
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
## 2022-01-24 NOTE — Progress Notes (Signed)
[facility] for Infectious Disease  Date of Admission:  01/21/2022       Lines: [place] Mayo picc from previous admission Foley catheter  Abx: 7/6-c vanc 7/6-c cefepime  Outpatient ceftriaxone/doxycycline   ASSESSMENT: Septic shock Hypoxic resp failure s/p mechanical vent Discitis/om L3-4; cx negative on disc aspiration; right psoas edema Afib Lft elevation  Aki and lft improving with supportive care Fever resolved Bcx ngtd Ct chest showed ?left lung pna  Mrsa nares positive   Ddx pna vs line infection    PLAN: Continue current vanc/cefepime If continued increasing pressor requirement please remove picc line F/u blood cx report Keep current abx for now   I spent more than 35 minute reviewing data/chart, and coordinating care and >50% direct face to face time providing counseling/discussing diagnostics/treatment plan with patient   Principal Problem:   Septic shock ([place]) Active Problems:   Acute respiratory failure with hypoxia and hypercapnia (H[facility])   COPD (chronic obstructive pulmonary disease) ([place])   Acute kidney injury ([place])   Discitis of lumbar region   Pressure injury of skin   No Known Allergies  Scheduled Meds:  arformoterol  15 mcg Nebulization BID   budesonide (PULMICORT) nebulizer solution  0.5 mg Nebulization BID   Chlorhexidine Gluconate Cloth  6 each Topical Daily   docusate  100 mg Per Tube BID   enoxaparin (LOVENOX) injection  1 mg/kg Subcutaneous Q12H   mupirocin ointment  1 Application Nasal BID   nutrition supplement (JUVEN)  1 packet Per Tube BID BM   mouth rinse  15 mL Mouth Rinse Q2H   pantoprazole sodium  40 mg Per Tube Daily   polyethylene glycol  17 g Per Tube Daily   sodium chloride flush  10-40 mL Intracatheter Q12H   Continuous Infusions:  sodium chloride Stopped (01/23/22 1353)   ceFEPime (MAXIPIME) IV Stopped (01/24/22 1035)   feeding supplement (VITAL AF 1.2 CAL) 40 mL/hr at 01/24/22 1044   fentaNYL  infusion INTRAVENOUS 175 mcg/hr (01/24/22 1044)   norepinephrine (LEVOPHED) Adult infusion 7 mcg/min (01/24/22 1044)   vancomycin 1,000 mg (01/24/22 1053)   PRN Meds:.albuterol, fentaNYL, midazolam, sodium chloride flush   SUBJECTIVE: Moving all extremities when sedation lightened.  No rash No diarrhea Minimal vent setting On 7 mcg/min of levophed stable  Fever resolved Bcx ngtd   Review of Systems: ROS All other ROS was negative, except mentioned above     OBJECTIVE: Vitals:   01/24/22 1100 01/24/22 1101 01/24/22 1115 01/24/22 1130  BP: 101/62 101/62 101/61 (!) 80/57  Pulse: (!) 101 (!) 112 (!) 113 (!) 116  Resp: '20 17 20 19  '$ Temp:      TempSrc:      SpO2: 94% 95% 95% 96%  Weight:      Height:       Body mass index is 32.56 kg/m.  Physical Exam  General/constitutional: ill appearing; intubated; levo @ 7 mcg / min HEENT: Normocephalic; conj clear Neck supple CV: rrr no mrg Lungs: clear on vent Abd: Soft Ext: no edema Skin: No Rash Neuro: moving upper ext a bit nonvolitionally - in mitt restraint MSK: no peripheral joint redness/warmth   Central line presence: rue picc site no purulence  Lab Results Lab Results  Component Value Date   WBC 15.2 (H) 01/24/2022   HGB 11.7 (L) 01/24/2022   HCT 36.7 (L) 01/24/2022   MCV 95.1 01/24/2022   PLT 265 01/24/2022    Lab Results  Component Value Date   CREATININE 1.50 (H) 01/24/2022   BUN 29 (H) 01/24/2022   NA 141 01/24/2022   K 3.7 01/24/2022   CL 109 01/24/2022   CO2 25 01/24/2022    Lab Results  Component Value Date   ALT 91 (H) 01/24/2022   AST 96 (H) 01/24/2022   ALKPHOS 84 01/24/2022   BILITOT 1.1 01/24/2022      Microbiology: Recent Results (from the past 240 hour(s))  Resp Panel by RT-PCR (Flu A&B, Covid) Peripheral     Status: None   Collection Time: 02/10/2022  9:20 AM   Specimen: Peripheral; Nasal Swab  Result Value Ref Range Status   SARS Coronavirus 2 by RT PCR NEGATIVE NEGATIVE  Final    Comment: (NOTE) SARS-CoV-2 target nucleic acids are NOT DETECTED.  The SARS-CoV-2 RNA is generally detectable in upper respiratory specimens during the acute phase of infection. The lowest concentration of SARS-CoV-2 viral copies this assay can detect is 138 copies/mL. A negative result does not preclude SARS-Cov-2 infection and should not be used as the sole basis for treatment or other patient management decisions. A negative result may occur with  improper specimen collection/handling, submission of specimen other than nasopharyngeal swab, presence of viral mutation(s) within the areas targeted by this assay, and inadequate number of viral copies(<138 copies/mL). A negative result must be combined with clinical observations, patient history, and epidemiological information. The expected result is Negative.  Fact Sheet for Patients:  EntrepreneurPulse.com.au  Fact Sheet for Healthcare Providers:  IncredibleEmployment.be  This test is no t yet approved or cleared by the [place] FDA and  has been authorized for detection and/or diagnosis of SARS-CoV-2 by FDA under an Emergency Use Authorization (EUA). This EUA will remain  in effect (meaning this test can be used) for the duration of the COVID-19 declaration under Section 564(b)(1) of the Act, 21 U.S.C.section 360bbb-3(b)(1), unless the authorization is terminated  or revoked sooner.       Influenza A by PCR NEGATIVE NEGATIVE Final   Influenza B by PCR NEGATIVE NEGATIVE Final    Comment: (NOTE) The Xpert Xpress SARS-CoV-2/FLU/RSV plus assay is intended as an aid in the diagnosis of influenza from Nasopharyngeal swab specimens and should not be used as a sole basis for treatment. Nasal washings and aspirates are unacceptable for Xpert Xpress SARS-CoV-2/FLU/RSV testing.  Fact Sheet for Patients: EntrepreneurPulse.com.au  Fact Sheet for Healthcare  Providers: IncredibleEmployment.be  This test is not yet approved or cleared by the [place] FDA and has been authorized for detection and/or diagnosis of SARS-CoV-2 by FDA under an Emergency Use Authorization (EUA). This EUA will remain in effect (meaning this test can be used) for the duration of the COVID-19 declaration under Section 564(b)(1) of the Act, 21 U.S.C. section 360bbb-3(b)(1), unless the authorization is terminated or revoked.  Performed at [facility], [place] [address], [place], [place] [address]   Urine Culture     Status: None   Collection Time: 01/24/2022  9:20 AM   Specimen: In/Out Cath Urine  Result Value Ref Range Status   Specimen Description IN/OUT CATH URINE  Final   Special Requests NONE  Final   Culture   Final    NO GROWTH Performed at [facility], [place] [address], [place], [place] [address]    Report Status 01/23/2022 FINAL  Final  Blood Culture (routine x 2)     Status: None (Preliminary result)   Collection Time: 01/30/2022  9:27 AM  Specimen: BLOOD LEFT FOREARM  Result Value Ref Range Status   Specimen Description BLOOD LEFT FOREARM  Final   Special Requests   Final    BOTTLES DRAWN AEROBIC AND ANAEROBIC Blood Culture results may not be optimal due to an excessive volume of blood received in culture bottles   Culture   Final    NO GROWTH 2 DAYS Performed at [facility], [place] [address] [place], [place] [address]    Report Status PENDING  Incomplete  Culture, blood (Routine X 2) w Reflex to ID Panel     Status: None (Preliminary result)   Collection Time: 01/31/2022  9:49 AM   Specimen: BLOOD LEFT WRIST  Result Value Ref Range Status   Specimen Description BLOOD LEFT WRIST  Final   Special Requests   Final    BOTTLES DRAWN AEROBIC AND ANAEROBIC Blood Culture results may not be optimal due to an excessive volume of blood received in culture bottles   Culture   Final    NO GROWTH 2 DAYS Performed at [facility], [place] [address], [place], [place] [address]    Report Status PENDING  Incomplete  MRSA Next Gen by PCR, Nasal     Status: Abnormal   Collection Time: 01/23/22  7:36 AM   Specimen: Nasal Mucosa; Nasal Swab  Result Value Ref Range Status   MRSA by PCR Next Gen DETECTED (A) NOT DETECTED Final    Comment: RESULT CALLED TO, READ BACK BY AND VERIFIED WITH: Nehemiah Massed RN, AT 1030 01/23/22 D. VANHOOK (NOTE) The GeneXpert MRSA Assay (FDA approved for NASAL specimens only), is one component of a comprehensive MRSA colonization surveillance program. It is not intended to diagnose MRSA infection nor to guide or monitor treatment for MRSA infections. Test performance is not FDA approved in patients less than 46 years old. Performed at [facility], [place] [address], [place], [place] [address]      Serology:   Imaging: If present, new imagings (plain films, ct scans, and mri) have been personally visualized and interpreted; radiology reports have been reviewed. Decision making incorporated into the Impression / Recommendations.  7/07 cxr Persistent opacification of the LEFT lung especially inferiorly question atelectasis versus infiltrate.   Probable LEFT pleural effusion.   Improved aeration LEFT upper lobe versus prior study.  7/7 chest abd pelv ct without contrast 1. Probable left lower lobe pneumonia. Patchy dependent opacities in the left upper and right lower lobes, probably atelectasis. Followup PA and lateral chest X-ray is recommended in 3-4 weeks (following trial of antibiotic therapy if clinically warranted) to ensure resolution and exclude underlying malignancy. 2. No other source for sepsis identified in the chest, abdomen or pelvis. 3. Mild renal cortical thinning bilaterally. Bladder decompressed by Foley catheter. 4. Coronary and Aortic Atherosclerosis (ICD10-I70.0). 5. Additional incidental findings as detailed above.   Jabier Mutton, [place]  for Infectious [place] [phone] pager    01/24/2022, 11:52 AM

## 2022-01-25 ENCOUNTER — Encounter (HOSPITAL_COMMUNITY): Payer: Medicare Other

## 2022-01-25 ENCOUNTER — Inpatient Hospital Stay (HOSPITAL_COMMUNITY): Payer: Medicare Other

## 2022-01-25 DIAGNOSIS — M25531 Pain in right wrist: Secondary | ICD-10-CM

## 2022-01-25 DIAGNOSIS — J690 Pneumonitis due to inhalation of food and vomit: Secondary | ICD-10-CM

## 2022-01-25 DIAGNOSIS — J189 Pneumonia, unspecified organism: Secondary | ICD-10-CM

## 2022-01-25 LAB — CBC
HCT: 37.6 % — ABNORMAL LOW (ref 39.0–52.0)
Hemoglobin: 11.4 g/dL — ABNORMAL LOW (ref 13.0–17.0)
MCH: 29.4 pg (ref 26.0–34.0)
MCHC: 30.3 g/dL (ref 30.0–36.0)
MCV: 96.9 fL (ref 80.0–100.0)
Platelets: 226 10*3/uL (ref 150–400)
RBC: 3.88 MIL/uL — ABNORMAL LOW (ref 4.22–5.81)
RDW: 17 % — ABNORMAL HIGH (ref 11.5–15.5)
WBC: 11.6 10*3/uL — ABNORMAL HIGH (ref 4.0–10.5)
nRBC: 0 % (ref 0.0–0.2)

## 2022-01-25 LAB — VANCOMYCIN, PEAK
Vancomycin Pk: >60 ug/mL (ref 30–40)
Vancomycin Pk: 43 ug/mL — ABNORMAL HIGH (ref 30–40)

## 2022-01-25 LAB — GLUCOSE, CAPILLARY
Glucose-Capillary: 104 mg/dL — ABNORMAL HIGH (ref 70–99)
Glucose-Capillary: 107 mg/dL — ABNORMAL HIGH (ref 70–99)
Glucose-Capillary: 127 mg/dL — ABNORMAL HIGH (ref 70–99)
Glucose-Capillary: 156 mg/dL — ABNORMAL HIGH (ref 70–99)
Glucose-Capillary: 86 mg/dL (ref 70–99)

## 2022-01-25 LAB — MAGNESIUM: Magnesium: 2 mg/dL (ref 1.7–2.4)

## 2022-01-25 LAB — VANCOMYCIN, TROUGH: Vancomycin Tr: 21 ug/mL (ref 15–20)

## 2022-01-25 LAB — PHOSPHORUS: Phosphorus: 2.5 mg/dL (ref 2.5–4.6)

## 2022-01-25 MED ORDER — SODIUM CHLORIDE 0.9 % IV SOLN: INTRAVENOUS | Status: DC

## 2022-01-25 MED ORDER — FUROSEMIDE 10 MG/ML IJ SOLN
40.0000 mg | Freq: Once | INTRAMUSCULAR | Status: AC
  Administered 2022-01-25: 40 mg via INTRAVENOUS
  Filled 2022-01-25: qty 4

## 2022-01-25 MED ORDER — ORAL CARE MOUTH RINSE: 15.0000 mL | OROMUCOSAL | Status: DC | PRN

## 2022-01-25 MED ORDER — VANCOMYCIN HCL IN DEXTROSE 1-5 GM/200ML-% IV SOLN: 1000.0000 mg | INTRAVENOUS | Status: DC

## 2022-01-25 MED ORDER — ENOXAPARIN SODIUM 80 MG/0.8ML IJ SOSY
1.0000 mg/kg | PREFILLED_SYRINGE | Freq: Two times a day (BID) | INTRAMUSCULAR | Status: DC
  Administered 2022-01-25 – 2022-01-26 (×3): 80 mg via SUBCUTANEOUS
  Filled 2022-01-25 (×4): qty 0.8

## 2022-01-25 MED ORDER — FENTANYL CITRATE (PF) 100 MCG/2ML IJ SOLN: 50.0000 ug | INTRAMUSCULAR | Status: DC | PRN

## 2022-01-25 NOTE — Procedures (Signed)
Extubation Procedure Note  Patient Details:   Name: [name] DOB: 1946/07/06 MRN: [id number]   Airway Documentation:   Patient extubated per orders. Positive cuff leak prior to extubation. Pt placed on 5 lpm nasal cannula. Pt tolerating well. Vent end date: 01/25/22 Vent end time: 1044   Evaluation  O2 sats: stable throughout Complications: No apparent complications Patient did tolerate procedure well. Bilateral Breath Sounds: Diminished   Yes  Ander Purpura 01/25/2022, 10:45 AM

## 2022-01-25 NOTE — Progress Notes (Signed)
NAME:  [name], MRN:  [id number], DOB:  [birth year], LOS: 3 ADMISSION DATE:  01/21/2022, CONSULTATION DATE:  02/10/2022 REFERRING MD:  Dr. Vanita Panda, CHIEF COMPLAINT:  SOB/ hypoxia   History of Present Illness:  76 yo male was in hospital from 6/09 to 6/19 with L3-4 discitis and osteomyelitis discharged on 6 weeks of teflaro sent from SNF with dyspnea and hypoxia with SpO2 60% on room air.  Found to have white out of Lt lung on xray.  Required intubation in ER.  Started on pressors and antibiotics.  PCCM consulted to admit to ICU.  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  Remains on vent, sedation.  Objective   Blood pressure (!) 72/62, pulse (!) 108, temperature 99.1 F (37.3 C), temperature source Oral, resp. rate 16, height '5\' 6"'$  (1.676 m), weight 80.7 kg, SpO2 97 %.    Vent Mode: PRVC FiO2 (%):  [40 %-50 %] 40 % Set Rate:  [16 bmp-166 bmp] 16 bmp Vt Set:  [510 mL] 510 mL PEEP:  [5 cmH20-6 cmH20] 5 cmH20 Plateau Pressure:  [17 cmH20-19 cmH20] 19 cmH20   Intake/Output Summary (Last 24 hours) at 01/25/2022 0737 Last data filed at 01/25/2022 0500 Gross per 24 hour  Intake 2498.14 ml  Output 1030 ml  Net 1468.14 ml   Filed Weights   01/23/22 0500 01/24/22 0500 01/25/22 0500  Weight: 90.7 kg 91.5 kg 80.7 kg   Examination:  General - sedated Eyes - pupils reactive ENT - ETT  Cardiac - regular rate/rhythm, no murmur Chest - equal  breath sounds b/l, no wheezing or rales Abdomen - soft, non tender, + bowel sounds Extremities - no cyanosis, clubbing, or edema Skin - no rashes Neuro - normal strength, moves extremities, follows commands Psych - normal mood and behavior Lymphatics - no lymphadenopathy GU - no lesions noted    Resolved Hospital Problem list   Septic shock  Assessment & Plan:   Lt lower lobe bacterial HCA (POA). - Abx day 4, currently on vancomycin and cefepime - ID consulted  L3-L4 diskitis in June 2023. - he has shrapnel in his head, so radiology recommending against repeat MRI of lumbar spine - CT abd/pelvis from 01/23/22 didn't show any significant osseous findings - uncertain whether this was contributing to his current illness, but seems less likely  Acute hypoxic/hypercapnic respiratory failure. Hx of COPD. - might be ready for extubation trial soon - f/u CXR intermittently - continue brovana, pulmicort   Hx of A fib. Valvular heart disease. CAD, Chronic diastolic CHF. - monitor on telemetry - full dose lovenox for now - lasix 40 mg IV x one on 3/41   Acute metabolic encephalopathy from sepsis. Chronic pain. - RASS goal 0 to -1   AKI from ATN 2nd to sepsis. - f/u BMET   Pressure injury. - Rt heel, deep tissue injury, not POA  Best Practice (right click and "Reselect all SmartList Selections" daily)   Diet/type: NPO DVT prophylaxis: SCD  GI prophylaxis: N/A Lines: Central line- PTA RIJ tunneled single lumen CVL Foley:  Yes, and it is still needed Code Status:  full code Last date of multidisciplinary goals of care discussion [pending]  Labs:      Latest Ref Rng & Units 01/24/2022   11:30 AM 01/24/2022    3:29 AM 01/23/2022   12:25 AM  CMP  Glucose 70 - 99 mg/dL 158  119  130   BUN 8 - 23 mg/dL 31  29  36   Creatinine 0.61 - 1.24 mg/dL 1.58  1.50  1.99   Sodium 135 - 145 mmol/L 141  141  145   Potassium 3.5 - 5.1 mmol/L 3.9  3.7  4.3   Chloride 98 - 111 mmol/L 106   109  105   CO2 22 - 32 mmol/L '27  25  25   '$ Calcium 8.9 - 10.3 mg/dL 8.1  7.4  8.4   Total Protein 6.5 - 8.1 g/dL  6.2  6.7   Total Bilirubin 0.3 - 1.2 mg/dL  1.1  1.2   Alkaline Phos 38 - 126 U/L  84  86   AST 15 - 41 U/L  96  116   ALT 0 - 44 U/L  91  92        Latest Ref Rng & Units 01/25/2022    5:25 AM 01/24/2022    3:29 AM 01/23/2022   12:25 AM  CBC  WBC 4.0 - 10.5 K/uL 11.6  15.2  14.8   Hemoglobin 13.0 - 17.0 g/dL 11.4  11.7  12.8   Hematocrit 39.0 - 52.0 % 37.6  36.7  40.9   Platelets 150 - 400 K/uL 226  265  258     ABG    Component Value Date/Time   PHART 7.508 (H) 01/26/2022 1837   PCO2ART 36.0 01/20/2022 1837   PO2ART 58 (L) 02/14/2022 1837   HCO3 28.6 (H) 02/11/2022 1837   TCO2 30 01/21/2022 1837   ACIDBASEDEF 0.8 12/31/2021 1019   O2SAT 92 02/09/2022 1837    CBG (last 3)  Recent Labs    01/24/22 1547 01/24/22 2001 01/24/22 2344  GLUCAP 148* 162* 134*    Critical care time: 32 minutes  Chesley Mires, MD [place] Pager - [phone] 01/25/2022, 7:37 AM

## 2022-01-25 NOTE — Progress Notes (Signed)
Pharmacy Antibiotic Note  [name] is a 76 y.o. male presented on 01/21/2022 with AMS and shortness of breath and currently on BiPap with concern for sepsis. From nursing facility where he has been on ceftaroline since 01/17/2022.   Lvls drawn today with AUC of 744 (above goal of 400 - 600)  Plan: Reduce vanc to 1 g 36 h Continue cefepime as ordered PER ID to continue through 7/12 then return to PTA regimen  Height: '5\' 6"'$  (167.6 cm) Weight: 80.7 kg (177 lb 14.6 oz) IBW/kg (Calculated) : 63.8  Temp (24hrs), Avg:99.2 F (37.3 C), Min:98.9 F (37.2 C), Max:99.7 F (37.6 C)  Recent Labs  Lab 02/10/2022 0927 01/26/2022 1216 01/23/22 0025 01/24/22 0329 01/24/22 1130 01/25/22 0525 01/25/22 0843 01/25/22 1059 01/25/22 1306  WBC 13.6*  --  14.8* 15.2*  --  11.6*  --   --   --   CREATININE 2.17*  --  1.99* 1.50* 1.58*  --   --   --   --   LATICACIDVEN 1.4 1.5  --   --   --   --   --   --   --   VANCOTROUGH  --   --   --   --   --   --  21*  --   --   VANCOPEAK  --   --   --   --   --   --   --  >60* 43*     Estimated Creatinine Clearance: 41 mL/min (A) (by C-G formula based on SCr of 1.58 mg/dL (H)).    No Known Allergies  Barth Kirks, PharmD, BCCCP Clinical Pharmacist [phone]  Please check AMION for all [place] numbers  01/25/2022 2:12 PM

## 2022-01-25 NOTE — Progress Notes (Signed)
[facility]  Date of Admission:  01/19/2022       Lines: Zenon Mayo picc from previous admission Foley catheter  Abx: 7/6-c vanc 7/6-c cefepime  Outpatient ceftriaxone/doxycycline   ASSESSMENT: Septic shock Right wrist swelling/tenderness Hypoxic resp failure s/p mechanical vent Discitis/om L3-4; cx negative on disc aspiration; right psoas edema Afib Lft elevation  Aki and lft improving with supportive care Fever resolved Bcx ngtd Ct chest showed ?left lung pna  Mrsa nares positive   Ddx pna vs line infection   --------- 7/09 assessment Patient extubated today Wbc improving Sepsis had resolved   Mrsa nares negative Respiratory cx not available  Clinically improved on vanc/cefepime. Admission bcx negative to date.   Ongoing dx for this admission appears to be aspiration pna  However severe right wrist tenderness not allowing any passive/active rom. He had wrist restraint on yesterday and was sedated/intubated, so unclear how long it has been like that. First noticed 7/9. Ddx gout vs septic arthritis  He has some duskiness appearing indurated area left volar forearm from probable norepinephrine infiltration that doesn't appear infected  Reasonable to leave previous picc in for now   PLAN: Finish 7 day of vanc/cefepime on 7/12, then would resume previous abx treatment for the vertebral OM/discitis Would get mri right wrist and likely will need arthrocentesis for diagnosis F/u final blood cx Dr Juleen China to resume care tomorrow  I spent more than 35 minute reviewing data/chart, and coordinating care and >50% direct face to face time providing counseling/discussing diagnostics/treatment plan with patient    Principal Problem:   Septic shock ([place]) Active Problems:   Acute respiratory failure with hypoxia and hypercapnia (HCC)   COPD (chronic obstructive pulmonary disease) ([place])   Acute kidney injury ([place])   Discitis  of lumbar region   Pressure injury of skin   No Known Allergies  Scheduled Meds:  arformoterol  15 mcg Nebulization BID   budesonide (PULMICORT) nebulizer solution  0.5 mg Nebulization BID   Chlorhexidine Gluconate Cloth  6 each Topical Daily   enoxaparin (LOVENOX) injection  1 mg/kg Subcutaneous Q12H   mupirocin ointment  1 Application Nasal BID   mouth rinse  15 mL Mouth Rinse Q2H   sodium chloride flush  10-40 mL Intracatheter Q12H   Continuous Infusions:  sodium chloride Stopped (01/23/22 1353)   ceFEPime (MAXIPIME) IV Stopped (01/24/22 2150)   vancomycin 1,000 mg (01/25/22 1005)   PRN Meds:.albuterol, fentaNYL (SUBLIMAZE) injection, sodium chloride flush   SUBJECTIVE: Extubated Wbc down Afebrile Admission bcx negative  Wright wrist swelling/pain. First noticed today  Left forearm concern for levophed infiltration   Review of Systems: ROS All other ROS was negative, except mentioned above     OBJECTIVE: Vitals:   01/25/22 0800 01/25/22 0854 01/25/22 0900 01/25/22 1044  BP: 95/62 92/60 109/77 (!) 85/73  Pulse: (!) 114 (!) 109 (!) 114 (!) 125  Resp: 18 (!) 22 18 (!) 24  Temp:      TempSrc:      SpO2: 100% 99% 99% 98%  Weight:      Height:       Body mass index is 28.72 kg/m.  Physical Exam  General/constitutional: some response and cooperation to simple commands; ill appearing; no distress; guarding right arm/wrist; eyes closed HEENT: Normocephalic, PER Neck supple CV: rrr no mrg Lungs: normal respiratory effort; on supplemental o2 via [place] Abd: Soft, Nontender Ext: no edema Skin: left forearm purplish slightly  indurated area previous peripheral IV and norepinephrine infusion Neuro: nonfocal MSK: swollen right hand/wrist with tenderness on any passive/active movement of wrist  Central line presence: rue picc site no purulence  Lab Results Lab Results  Component Value Date   WBC 11.6 (H) 01/25/2022   HGB 11.4 (L) 01/25/2022   HCT 37.6 (L)  01/25/2022   MCV 96.9 01/25/2022   PLT 226 01/25/2022    Lab Results  Component Value Date   CREATININE 1.58 (H) 01/24/2022   BUN 31 (H) 01/24/2022   NA 141 01/24/2022   K 3.9 01/24/2022   CL 106 01/24/2022   CO2 27 01/24/2022    Lab Results  Component Value Date   ALT 91 (H) 01/24/2022   AST 96 (H) 01/24/2022   ALKPHOS 84 01/24/2022   BILITOT 1.1 01/24/2022      Microbiology: Recent Results (from the past 240 hour(s))  Resp Panel by RT-PCR (Flu A&B, Covid) Peripheral     Status: None   Collection Time: 02/02/2022  9:20 AM   Specimen: Peripheral; Nasal Swab  Result Value Ref Range Status   SARS Coronavirus 2 by RT PCR NEGATIVE NEGATIVE Final    Comment: (NOTE) SARS-CoV-2 target nucleic acids are NOT DETECTED.  The SARS-CoV-2 RNA is generally detectable in upper respiratory specimens during the acute phase of infection. The lowest concentration of SARS-CoV-2 viral copies this assay can detect is 138 copies/mL. A negative result does not preclude SARS-Cov-2 infection and should not be used as the sole basis for treatment or other patient management decisions. A negative result may occur with  improper specimen collection/handling, submission of specimen other than nasopharyngeal swab, presence of viral mutation(s) within the areas targeted by this assay, and inadequate number of viral copies(<138 copies/mL). A negative result must be combined with clinical observations, patient history, and epidemiological information. The expected result is Negative.  Fact Sheet for Patients:  EntrepreneurPulse.com.au  Fact Sheet for Healthcare Providers:  IncredibleEmployment.be  This test is no t yet approved or cleared by the [place] FDA and  has been authorized for detection and/or diagnosis of SARS-CoV-2 by FDA under an Emergency Use Authorization (EUA). This EUA will remain  in effect (meaning this test can be used) for the duration of  the COVID-19 declaration under Section 564(b)(1) of the Act, 21 U.S.C.section 360bbb-3(b)(1), unless the authorization is terminated  or revoked sooner.       Influenza A by PCR NEGATIVE NEGATIVE Final   Influenza B by PCR NEGATIVE NEGATIVE Final    Comment: (NOTE) The Xpert Xpress SARS-CoV-2/FLU/RSV plus assay is intended as an aid in the diagnosis of influenza from Nasopharyngeal swab specimens and should not be used as a sole basis for treatment. Nasal washings and aspirates are unacceptable for Xpert Xpress SARS-CoV-2/FLU/RSV testing.  Fact Sheet for Patients: EntrepreneurPulse.com.au  Fact Sheet for Healthcare Providers: IncredibleEmployment.be  This test is not yet approved or cleared by the [place] FDA and has been authorized for detection and/or diagnosis of SARS-CoV-2 by FDA under an Emergency Use Authorization (EUA). This EUA will remain in effect (meaning this test can be used) for the duration of the COVID-19 declaration under Section 564(b)(1) of the Act, 21 U.S.C. section 360bbb-3(b)(1), unless the authorization is terminated or revoked.  Performed at [facility], [place] [address] [place], [place] [address]   Urine Culture     Status: None   Collection Time: 01/23/2022  9:20 AM   Specimen: In/Out Cath Urine  Result Value  Ref Range Status   Specimen Description IN/OUT CATH URINE  Final   Special Requests NONE  Final   Culture   Final    NO GROWTH Performed at [facility], [address] [place], [place] [address]    Report Status 01/23/2022 FINAL  Final  Blood Culture (routine x 2)     Status: None (Preliminary result)   Collection Time: 02/05/2022  9:27 AM   Specimen: BLOOD LEFT FOREARM  Result Value Ref Range Status   Specimen Description BLOOD LEFT FOREARM  Final   Special Requests   Final    BOTTLES DRAWN AEROBIC AND ANAEROBIC Blood Culture results may not be optimal due to an excessive volume of  blood received in culture bottles   Culture   Final    NO GROWTH 3 DAYS Performed at [facility], [place] [address] [place], [place] [address]    Report Status PENDING  Incomplete  Culture, blood (Routine X 2) w Reflex to ID Panel     Status: None (Preliminary result)   Collection Time: 02/05/2022  9:49 AM   Specimen: BLOOD LEFT WRIST  Result Value Ref Range Status   Specimen Description BLOOD LEFT WRIST  Final   Special Requests   Final    BOTTLES DRAWN AEROBIC AND ANAEROBIC Blood Culture results may not be optimal due to an excessive volume of blood received in culture bottles   Culture   Final    NO GROWTH 3 DAYS Performed at [facility], [place] [address] [place], [place] [address]    Report Status PENDING  Incomplete  MRSA Next Gen by PCR, Nasal     Status: Abnormal   Collection Time: 01/23/22  7:36 AM   Specimen: Nasal Mucosa; Nasal Swab  Result Value Ref Range Status   MRSA by PCR Next Gen DETECTED (A) NOT DETECTED Final    Comment: RESULT CALLED TO, READ BACK BY AND VERIFIED WITH: Nehemiah Massed RN, AT 1030 01/23/22 D. VANHOOK (NOTE) The GeneXpert MRSA Assay (FDA approved for NASAL specimens only), is one component of a comprehensive MRSA colonization surveillance program. It is not intended to diagnose MRSA infection nor to guide or monitor treatment for MRSA infections. Test performance is not FDA approved in patients less than 3 years old. Performed at [facility], [place] [address], [place], [place] [address]      Serology:   Imaging: If present, new imagings (plain films, ct scans, and mri) have been personally visualized and interpreted; radiology reports have been reviewed. Decision making incorporated into the Impression / Recommendations.  7/07 cxr Persistent opacification of the LEFT lung especially inferiorly question atelectasis versus infiltrate.   Probable LEFT pleural effusion.   Improved aeration LEFT upper lobe versus prior  study.  7/7 chest abd pelv ct without contrast 1. Probable left lower lobe pneumonia. Patchy dependent opacities in the left upper and right lower lobes, probably atelectasis. Followup PA and lateral chest X-ray is recommended in 3-4 weeks (following trial of antibiotic therapy if clinically warranted) to ensure resolution and exclude underlying malignancy. 2. No other source for sepsis identified in the chest, abdomen or pelvis. 3. Mild renal cortical thinning bilaterally. Bladder decompressed by Foley catheter. 4. Coronary and Aortic Atherosclerosis (ICD10-I70.0). 5. Additional incidental findings as detailed above.   Jabier Mutton, [facility] [place] [phone] pager    01/25/2022, 11:03 AM

## 2022-01-26 ENCOUNTER — Inpatient Hospital Stay (HOSPITAL_COMMUNITY): Payer: Medicare Other

## 2022-01-26 DIAGNOSIS — M4646 Discitis, unspecified, lumbar region: Secondary | ICD-10-CM

## 2022-01-26 DIAGNOSIS — N179 Acute kidney failure, unspecified: Secondary | ICD-10-CM

## 2022-01-26 DIAGNOSIS — R52 Pain, unspecified: Secondary | ICD-10-CM

## 2022-01-26 LAB — BASIC METABOLIC PANEL
Anion gap: 10 (ref 5–15)
BUN: 31 mg/dL — ABNORMAL HIGH (ref 8–23)
CO2: 25 mmol/L (ref 22–32)
Calcium: 7.8 mg/dL — ABNORMAL LOW (ref 8.9–10.3)
Chloride: 106 mmol/L (ref 98–111)
Creatinine, Ser: 1.49 mg/dL — ABNORMAL HIGH (ref 0.61–1.24)
GFR, Estimated: 49 mL/min — ABNORMAL LOW (ref >60)
Glucose, Bld: 93 mg/dL (ref 70–99)
Potassium: 3.5 mmol/L (ref 3.5–5.1)
Sodium: 141 mmol/L (ref 135–145)

## 2022-01-26 LAB — GLUCOSE, CAPILLARY
Glucose-Capillary: 102 mg/dL — ABNORMAL HIGH (ref 70–99)
Glucose-Capillary: 112 mg/dL — ABNORMAL HIGH (ref 70–99)
Glucose-Capillary: 112 mg/dL — ABNORMAL HIGH (ref 70–99)
Glucose-Capillary: 85 mg/dL (ref 70–99)
Glucose-Capillary: 89 mg/dL (ref 70–99)
Glucose-Capillary: 92 mg/dL (ref 70–99)

## 2022-01-26 MED ORDER — COLCHICINE 0.6 MG PO TABS
0.6000 mg | ORAL_TABLET | Freq: Once | ORAL | Status: AC
  Administered 2022-01-26: 0.6 mg via NASOGASTRIC
  Filled 2022-01-26: qty 1

## 2022-01-26 MED ORDER — NOREPINEPHRINE 16 MG/250ML-% IV SOLN
0.0000 ug/min | INTRAVENOUS | Status: DC
  Administered 2022-01-26: 2 ug/min via INTRAVENOUS
  Filled 2022-01-26: qty 250

## 2022-01-26 MED ORDER — COLCHICINE 0.6 MG PO TABS
1.2000 mg | ORAL_TABLET | Freq: Once | ORAL | Status: AC
  Administered 2022-01-26: 1.2 mg via NASOGASTRIC
  Filled 2022-01-26: qty 2

## 2022-01-26 MED ORDER — POTASSIUM CHLORIDE 20 MEQ PO PACK
40.0000 meq | PACK | Freq: Every day | ORAL | Status: DC
  Filled 2022-01-26: qty 2

## 2022-01-26 MED ORDER — LACTATED RINGERS IV BOLUS
500.0000 mL | Freq: Once | INTRAVENOUS | Status: AC
  Administered 2022-01-26: 500 mL via INTRAVENOUS

## 2022-01-26 MED ORDER — POTASSIUM CHLORIDE 20 MEQ PO PACK: 40.0000 meq | PACK | Freq: Every day | ORAL | Status: DC

## 2022-01-26 MED ORDER — POTASSIUM CHLORIDE 20 MEQ PO PACK
40.0000 meq | PACK | Freq: Every day | ORAL | Status: DC
  Administered 2022-01-26 – 2022-01-29 (×4): 40 meq via ORAL
  Filled 2022-01-26 (×3): qty 2

## 2022-01-26 MED ORDER — HALOPERIDOL LACTATE 5 MG/ML IJ SOLN
1.0000 mg | Freq: Once | INTRAMUSCULAR | Status: AC | PRN
  Administered 2022-01-26: 1 mg via INTRAVENOUS
  Filled 2022-01-26: qty 1

## 2022-01-26 MED ORDER — VANCOMYCIN HCL IN DEXTROSE 1-5 GM/200ML-% IV SOLN
1000.0000 mg | INTRAVENOUS | Status: AC
  Administered 2022-01-26 – 2022-01-28 (×2): 1000 mg via INTRAVENOUS
  Filled 2022-01-26 (×3): qty 200

## 2022-01-26 MED ORDER — COLCHICINE 0.6 MG PO TABS
0.6000 mg | ORAL_TABLET | Freq: Two times a day (BID) | ORAL | Status: DC
Start: 2022-01-27 — End: 2022-01-26

## 2022-01-26 MED ORDER — COLCHICINE 0.6 MG PO TABS
0.6000 mg | ORAL_TABLET | Freq: Two times a day (BID) | ORAL | Status: DC
  Administered 2022-01-27 – 2022-01-29 (×5): 0.6 mg via ORAL
  Filled 2022-01-26 (×6): qty 1

## 2022-01-26 NOTE — Progress Notes (Signed)
SLP Cancellation Note  Patient Details Name: [name] MRN: [id number] DOB: [birth year]   Cancelled treatment:       Reason Eval/Treat Not Completed: Other (comment). Pt passed RN stroke swallow screen x2 but MD is concerned about aspiration on admit. Will proceed with MBS.    Abriel Geesey, Katherene Ponto 01/26/2022, 11:35 AM

## 2022-01-26 NOTE — Progress Notes (Signed)
Right UE venous duplex study completed. Please see CV Proc for preliminary results.  Anderson Malta  Maxwell Lemen BS, RVT 01/26/2022 2:37 PM

## 2022-01-26 NOTE — Progress Notes (Signed)
Modified Barium Swallow Progress Note  Patient Details  Name: [name] MRN: [id number] Date of Birth: 04/02/1946  Today's Date: 01/26/2022  Modified Barium Swallow completed.  Full report located under Chart Review in the Imaging Section.  Brief recommendations include the following:  Clinical Impression  Pt demonstrates normal swallowing without any aspiration or difficulty. Pt unable to swallow pill with liquids or puree, no esophageal sweep. RN reports pt was able to swallow pills with water at bedside. Pt to continue a regular diet and thin liquids. Will sign off.   Swallow Evaluation Recommendations       SLP Diet Recommendations: Regular solids;Thin liquid       Medication Administration: Whole meds with liquid   Supervision: Patient able to self feed           Oral Care Recommendations: Oral care BID        [name], [name] 01/26/2022,2:07 PM

## 2022-01-26 NOTE — Progress Notes (Addendum)
[facility] for Infectious Disease  Date of Admission:  02/12/2022           Reason for visit: Follow up on sepsis  Current antibiotics: Vancomycin 7/6--present Cefepime 7/6--present   ASSESSMENT:    76 y.o. male admitted with:  Left lower lobe pneumonia: Ongoing diagnosis this admission for his septic shock appears to be pneumonia vs possible line infection.  Extubated 7/9 and currently on nasal cannula.  Today is day #5 of vancomycin and cefepime. Discitis/osteomyelitis L3-L4: Patient has been on IV antibiotics for this issue since his June hospitalization with ceftaroline and then recently transitioned to ceftriaxone and doxycycline on approximately 01/17/2022 by his SNF due to the cost of ceftaroline.  CT abdomen/pelvis 01/23/2022 with no significant osseous findings.  Patient has shrapnel in his head and radiology recommended against repeating an MRI of his lumbar spine at this time.  Seems less likely that this is contributing to his current hospitalization. Vascular access: Tunneled central line remains in place.  Blood cultures this admission are negative, however, this is not unexpected given that he presented while on antibiotics for several weeks. Right wrist pain: Differential includes septic arthritis versus gout flare.  MRI was ordered yesterday and is currently pending.  Unclear if this will be obtained given recommendation against lumbar MRI by radiology. Acute kidney injury: In the setting of CKD 3.  Creatinine has been slowly improving since admission.  RECOMMENDATIONS:    Continue vancomycin and cefepime for pneumonia coverage.  Will plan to finish 7-day course for this on 01/28/2022 prior to resuming treatment for vertebral infection Difficult to definitively exclude a line infection despite negative blood cultures since he had been on antibiotics prior to admission He is currently off pressors and has peripheral IV access.  If he remains off pressors through today,  will plan for IR to remove line tomorrow If he is to discharge prior to completing therapy for his vertebral infection, then he can finish therapy on an oral antibiotic after discharge Recommend orthopedics to see regarding his right wrist Lab monitoring  Principal Problem:   Septic shock () Active Problems:   Acute respiratory failure with hypoxia and hypercapnia (HCC)   COPD (chronic obstructive pulmonary disease) (HCC)   Acute kidney injury ([place])   Discitis of lumbar region   Pressure injury of skin   HCAP (healthcare-associated pneumonia)   Right wrist pain    MEDICATIONS:    Scheduled Meds:  arformoterol  15 mcg Nebulization BID   budesonide (PULMICORT) nebulizer solution  0.5 mg Nebulization BID   Chlorhexidine Gluconate Cloth  6 each Topical Daily   enoxaparin (LOVENOX) injection  1 mg/kg Subcutaneous Q12H   mupirocin ointment  1 Application Nasal BID   sodium chloride flush  10-40 mL Intracatheter Q12H   Continuous Infusions:  sodium chloride Stopped (01/23/22 1353)   sodium chloride 50 mL/hr at 01/26/22 0900   ceFEPime (MAXIPIME) IV Stopped (01/25/22 2215)   norepinephrine (LEVOPHED) Adult infusion Stopped (01/26/22 0818)   vancomycin     PRN Meds:.albuterol, fentaNYL (SUBLIMAZE) injection, mouth rinse, sodium chloride flush  SUBJECTIVE:   24 hour events:  Afebrile, Tmax 97.8 Low blood pressure noted overnight requiring restarting norepinephrine.  This has now been titrated off Blood cultures are no growth    Patient this morning reports right wrist pain with palpation and range of motion.  No fevers.  Voices no other complaints.  Review of Systems  All other systems reviewed and are negative.  OBJECTIVE:   Blood pressure 98/67, pulse (!) 108, temperature 97.8 F (36.6 C), temperature source Oral, resp. rate 17, height '5\' 6"'$  (1.676 m), weight 80.7 kg, SpO2 97 %. Body mass index is 28.72 kg/m.  Physical Exam Constitutional:      Comments:  Ill-appearing man, lying in the ICU, no acute distress  HENT:     Head: Normocephalic and atraumatic.  Eyes:     Extraocular Movements: Extraocular movements intact.     Conjunctiva/sclera: Conjunctivae normal.  Pulmonary:     Effort: Pulmonary effort is normal. No respiratory distress.     Comments: He is comfortable appearing on nasal cannula. Abdominal:     General: There is no distension.     Palpations: Abdomen is soft.     Tenderness: There is no abdominal tenderness.  Musculoskeletal:        General: Tenderness present.     Cervical back: Normal range of motion and neck supple.     Comments: Right wrist with tenderness and decreased range of motion. Left forearm with stable appearance at prior IV site  Skin:    General: Skin is warm and dry.  Neurological:     General: No focal deficit present.     Mental Status: He is oriented to person, place, and time.  Psychiatric:        Mood and Affect: Mood normal.        Behavior: Behavior normal.      Lab Results: Lab Results  Component Value Date   WBC 11.6 (H) 01/25/2022   HGB 11.4 (L) 01/25/2022   HCT 37.6 (L) 01/25/2022   MCV 96.9 01/25/2022   PLT 226 01/25/2022    Lab Results  Component Value Date   NA 141 01/26/2022   K 3.5 01/26/2022   CO2 25 01/26/2022   GLUCOSE 93 01/26/2022   BUN 31 (H) 01/26/2022   CREATININE 1.49 (H) 01/26/2022   CALCIUM 7.8 (L) 01/26/2022   GFRNONAA 49 (L) 01/26/2022   GFRAA 61 07/16/2020    Lab Results  Component Value Date   ALT 91 (H) 01/24/2022   AST 96 (H) 01/24/2022   ALKPHOS 84 01/24/2022   BILITOT 1.1 01/24/2022       Component Value Date/Time   CRP 15.9 (H) 12/27/2021 1917       Component Value Date/Time   ESRSEDRATE 31 (H) 12/27/2021 1917     I have reviewed the micro and lab results in Epic.  Imaging: DG Wrist 2 Views Right  Result Date: 01/25/2022 CLINICAL DATA:  Swelling. EXAM: RIGHT WRIST - 2 VIEW COMPARISON:  None Available. FINDINGS: There is soft  tissue swelling surrounding the wrist. Peripheral vascular calcifications are present. There is no acute fracture or dislocation. Joint spaces are well maintained. No cortical erosions or periosteal reaction identified. IMPRESSION: 1. Diffuse soft tissue swelling surrounding the wrist. 2. No acute bony abnormality. Electronically Signed   By: Ronney Asters M.D.   On: 01/25/2022 15:34   DG Chest Port 1 View  Result Date: 01/25/2022 CLINICAL DATA:  VENTILATOR DEPENDENCE. EXAM: PORTABLE CHEST 1 VIEW COMPARISON:  01/23/2022 FINDINGS: 0512 HOURS. Endotracheal tube tip is 3.6 cm above the base of the carina. The NG tube passes into the stomach although the distal tip position is not included on the film. Right IJ central line tip overlies the mid SVC level. The cardio pericardial silhouette is enlarged. Retrocardiac collapse/consolidation is similar to prior with left pleural effusion. IMPRESSION: No substantial interval change. Cardiomegaly with  retrocardiac left base collapse/consolidation and left pleural effusion. Electronically Signed   By: Misty Stanley M.D.   On: 01/25/2022 09:07     Imaging independently reviewed in Epic.    Raynelle Highland for Infectious Disease [facility] [phone] pager 01/26/2022, 10:06 AM

## 2022-01-26 NOTE — Progress Notes (Signed)
[facility] Progress Note Patient Name: [name] DOB: 06-16-1946 MRN: [id number]   Date of Service  01/26/2022  HPI/Events of Note  Notified of hypotension with SBP as low in the 60s.  Pt has been restarted on levophed. Pt on Vancomycin and Cefepime.   eICU Interventions  Give 500cc LR bolus.  Titrate levophed to off.       Intervention Category Intermediate Interventions: Hypotension - evaluation and management  [name] 01/26/2022, 6:48 AM

## 2022-01-26 NOTE — Progress Notes (Signed)
PT Cancellation Note  Patient Details Name: [name] MRN: [id number] DOB: [birth year]   Cancelled Treatment:    Reason Eval/Treat Not Completed: Patient at procedure or test/unavailable.  First arrival going to swallow study.  Now in [place]. 01/26/2022  Ryan Carne., PT Acute Rehabilitation Services [phone]  (pager) [phone]  (office)   [name] 01/26/2022, 5:44 PM

## 2022-01-26 NOTE — Progress Notes (Addendum)
NAME:  [name], MRN:  [id number], DOB:  1945/07/27, LOS: 4 ADMISSION DATE:  02/11/2022, CONSULTATION DATE:  01/20/2022 REFERRING MD:  Dr. Vanita Panda, CHIEF COMPLAINT:  SOB/ hypoxia   History of Present Illness:  75 yo male was in hospital from 6/09 to 6/19 with L3-4 discitis and osteomyelitis discharged on 6 weeks of teflaro sent from SNF with dyspnea and hypoxia with SpO2 60% on room air.  Found to have white out of Lt lung on xray.  Required intubation in ER.  Started on pressors and antibiotics.  PCCM consulted to admit to ICU.  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 Norepi restarted this morning briefly, now discontinued, BP with MAPs in 80s. Remains on vanc/cefepime. Noted to have severe R tenderness yesterday by ID, c/f septic arthritis vs gout. MRI pending.  Objective   Blood pressure (!) 91/53, pulse (!) 101, temperature (!) 97.5 F (36.4 C), temperature source Oral, resp. rate 14, height '5\' 6"'$  (1.676 m), weight 80.7 kg, SpO2 97 %.    Vent Mode: PSV;CPAP FiO2 (%):  [40 %] 40 % PEEP:  [5 cmH20] 5 cmH20 Pressure Support:  [5 cmH20] 5 cmH20   Intake/Output Summary (Last 24 hours) at 01/26/2022 7169 Last data filed at 01/26/2022 0600 Gross per 24 hour  Intake 1665.02 ml  Output 380 ml  Net 1285.02 ml    Filed Weights   01/23/22 0500 01/24/22 0500 01/25/22 0500  Weight:  90.7 kg 91.5 kg 80.7 kg   Examination:  General - awake, alert, engaged with interviewer Eyes - pupils round and reactive ENT - nasal cannula in place Cardiac - irregularly irregular, without murmur Chest -lungs CTAB without wheezes, rales, or rhonchi Abdomen - soft, non-tender, mildly distended but without rigidity Extremities - mild edema and warmth with severe tenderness to R wrist Skin - without rash or excoriation Neuro - follows commands, moves all extremities well Psych - mood and affect appropriate, anxious   Resolved Hospital Problem list   Septic shock Acute metabolic encephalopathy from sepsis  Assessment & Plan:   Lt lower lobe bacterial HCA (POA). Admission blood cx ngtd. Septic shock resolved. BP improving, off pressors today.  - Abx day 5, currently on vancomycin and cefepime - ID following - SLP to evaluate swallowing; give diet if passes  R wrist pain and swelling - c/f septic arthritis vs gout - will start empiric colchicine - Discussed with MRI--not a candidate for MR given shrapnel in head.  Likely to need arthrocentesis, d/w orthopedics who will consult  L3-L4 diskitis in June 2023. - he has shrapnel in his head, so radiology recommending against repeat MRI of lumbar spine - CT abd/pelvis from 01/23/22 didn't show any significant osseous findings - uncertain whether this was contributing to his current illness, but seems less likely  Acute hypoxic/hypercapnic respiratory failure. Hx of COPD. - now extubated, stable on 5L - f/u CXR intermittently - continue brovana,  pulmicort   Hx of A fib. Valvular heart disease. CAD, Chronic diastolic CHF. - monitor on telemetry - full dose lovenox for now - s/p lasix 40 mg IV x 1 on 7/09   Chronic pain. - prn fentanyl   AKI from ATN 2nd to sepsis. Cr now at baseline - f/u BMET   Pressure injury. - Rt heel, deep tissue injury, not POA  Best Practice (right click and "Reselect all SmartList Selections"  daily)   Diet/type: NPO DVT prophylaxis: SCD GI prophylaxis: N/A Lines: Central line- PTA RIJ tunneled single lumen CVL Foley:  Yes, and it is still needed Code Status:  full code Last date of multidisciplinary goals of care discussion [pending]  Pearla Dubonnet, MD 11:00 AM

## 2022-01-26 NOTE — Consult Note (Signed)
Reason for Consult:Right wrist pain Referring Physician: Jacky Kindle Time called: 1121 Time at bedside: [name] is an 76 y.o. male.  HPI: [name] was admitted about 4d ago with recurrent sepsis. He had recently been hospitalized with discitis of the l-spine. He notes onset of right wrist pain about a week ago that has been steady. He denies prior hx/o similar but has had gout before. He is RHD.  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 Ali Lowe   Severe mitral regurgitation    Substance abuse ([place])    History of alcohol; hx cocaine, heroin, crack use   Syncope    Tubular adenoma of colon 08/2014    Past Surgical History:  Procedure Laterality Date   [place] AND ABLATION  08/2010   [name] 09/07/2010 (12/14/2012)   CARDIAC SURGERY     CARDIOVERSION N/A 10/16/2021   Procedure: CARDIOVERSION;  Surgeon: Larey Dresser, MD;  Location: [facility];  Service: Cardiovascular;  Laterality: N/A;   COLONOSCOPY     15-20 years ago had colon in [place] N/A 02/12/2020   Procedure: COLONOSCOPY WITH PROPOFOL;  Surgeon: Ladene Artist, MD;  Location: [facility];  Service: Endoscopy;   Laterality: N/A;   CORONARY STENT INTERVENTION N/A 10/10/2020   Procedure: CORONARY STENT INTERVENTION;  Surgeon: Lorretta Harp, MD;  Location: [facility] CV LAB;  Service: Cardiovascular;  Laterality: N/A;   EXCISIONAL HEMORRHOIDECTOMY  1970's   IR FLUORO GUIDE CV LINE RIGHT  01/05/2022   IR LUMBAR DISC ASPIRATION W/IMG GUIDE  12/29/2021   IR LUMBAR DISC ASPIRATION W/IMG GUIDE  12/29/2021   IR US GUIDE VASC ACCESS RIGHT  01/05/2022   LOOP RECORDER IMPLANT N/A 08/23/2013   Procedure: LOOP RECORDER IMPLANT;  Surgeon: Deboraha Sprang, MD;  Location: [facility] LAB;  Service: Cardiovascular;  Laterality: N/A;   MITRAL VALVE REPAIR N/A 11/20/2021   Procedure: MITRAL VALVE REPAIR;  Surgeon: [name], MD;  Location: [place] CV LAB;  Service: Cardiovascular;  Laterality: N/A;   MULTIPLE EXTRACTIONS WITH ALVEOLOPLASTY Bilateral 01/24/2016   Procedure: MULTIPLE EXTRACTION WITH ALVEOLOPLASTY BILATERAL;  Surgeon: Diona Browner, DDS;  Location: [facility];  Service: Oral Surgery;  Laterality: Bilateral;   MULTIPLE TOOTH EXTRACTIONS  01/24/2016   MULTIPLE EXTRACTION WITH ALVEOLOPLASTY BILATERAL (Bilateral)   POLYPECTOMY  02/12/2020   Procedure: POLYPECTOMY;  Surgeon: Ladene Artist, MD;  Location: [facility];  Service: Endoscopy;;   RIGHT HEART CATH N/A 10/03/2021   Procedure: RIGHT HEART CATH;  Surgeon: [name], MD;  Location: [facility] CV LAB;  Service: Cardiovascular;  Laterality: N/A;   RIGHT/LEFT HEART CATH AND CORONARY ANGIOGRAPHY N/A 10/09/2020   Procedure: RIGHT/LEFT HEART CATH AND CORONARY ANGIOGRAPHY;  Surgeon: Burnell Blanks, MD;  Location: [place] [facility] CV LAB;  Service: Cardiovascular;  Laterality: N/A;   TEE WITHOUT CARDIOVERSION N/A 10/16/2021   Procedure: TRANSESOPHAGEAL ECHOCARDIOGRAM (TEE);  Surgeon: Larey Dresser, MD;  Location: [facility] ENDOSCOPY;  Service: Cardiovascular;  Laterality: N/A;   TEE WITHOUT CARDIOVERSION N/A 11/07/2021   Procedure: TRANSESOPHAGEAL  ECHOCARDIOGRAM (TEE);  Surgeon: Werner Lean, MD;  Location: [facility] ENDOSCOPY;  Service: Cardiovascular;  Laterality: N/A;   TEE WITHOUT CARDIOVERSION N/A 11/20/2021   Procedure: TRANSESOPHAGEAL ECHOCARDIOGRAM (TEE);  Surgeon: [name], MD;  Location: [facility] CV LAB;  Service: Cardiovascular;  Laterality: N/A;    Family History  Problem Relation Age of Onset   Diabetes Mother    Hypertension Mother    Cirrhosis Father    Alcohol abuse Father    Colon cancer Neg Hx    Rectal cancer Neg Hx    Stomach cancer Neg Hx    Esophageal cancer Neg Hx    Colon polyps Neg Hx     Social History:  reports that he quit smoking about 2 months ago. His smoking use included cigarettes. He has a 100.00 pack-year smoking history. He has never used smokeless tobacco. He reports that he does not currently use alcohol after a past usage of about 2.0 standard drinks of alcohol per week. He reports that he does not currently use drugs after having used the following drugs: Heroin.  Allergies: No Known Allergies  Medications: I have reviewed the patient's current medications.  Results for orders placed or performed during the hospital encounter of 02/07/2022 (from the past 48 hour(s))  Glucose, capillary     Status: Abnormal   Collection Time: 01/24/22  3:47 PM  Result Value Ref Range   Glucose-Capillary 148 (H) 70 - 99 mg/dL    Comment: Glucose reference range applies only to samples taken after fasting for at least 8 hours.  Glucose, capillary     Status: Abnormal   Collection Time: 01/24/22  8:01 PM  Result Value Ref Range   Glucose-Capillary 162 (H) 70 - 99 mg/dL    Comment: Glucose reference range applies only to samples taken after fasting for at least 8 hours.  Glucose, capillary     Status: Abnormal   Collection Time: 01/24/22 11:44 PM  Result Value Ref Range   Glucose-Capillary 134 (H) 70 - 99 mg/dL    Comment: Glucose reference range applies only to samples taken after fasting  for at least 8 hours.  CBC     Status: Abnormal   Collection Time: 01/25/22  5:25 AM  Result Value Ref Range   WBC 11.6 (H) 4.0 - 10.5 K/uL   RBC 3.88 (L) 4.22 - 5.81 MIL/uL   Hemoglobin 11.4 (L) 13.0 - 17.0 g/dL   HCT 37.6 (L) 39.0 - 52.0 %   MCV 96.9 80.0 - 100.0 fL   MCH 29.4 26.0 - 34.0 pg   MCHC 30.3 30.0 - 36.0 g/dL   RDW 17.0 (H) 11.5 - 15.5 %   Platelets 226 150 - 400 K/uL   nRBC 0.0 0.0 - 0.2 %    Comment: Performed at [facility] [address] [place], [place] [address]  Magnesium     Status: None   Collection Time: 01/25/22  5:25 AM  Result Value Ref Range   Magnesium 2.0  1.7 - 2.4 mg/dL    Comment: Performed at [facility], [place] [address], [place], [place] [address]  Phosphorus     Status: None   Collection Time: 01/25/22  5:25 AM  Result Value Ref Range   Phosphorus 2.5 2.5 - 4.6 mg/dL    Comment: Performed at [facility] [address], [place], [place] [address]  Glucose, capillary     Status: Abnormal   Collection Time: 01/25/22  7:58 AM  Result Value Ref Range   Glucose-Capillary 127 (H) 70 - 99 mg/dL    Comment: Glucose reference range applies only to samples taken after fasting for at least 8 hours.  Vancomycin, trough     Status: Abnormal   Collection Time: 01/25/22  8:43 AM  Result Value Ref Range   Vancomycin Tr 21 (HH) 15 - 20 ug/mL    Comment: CRITICAL RESULT CALLED TO, READ BACK BY AND VERIFIED WITH: M.DYBING,RN 01/25/2022 AT 0948 AHUGHES Performed at [facility], [place] [address], [place], [place] [address]   Vancomycin, peak     Status: Abnormal   Collection Time: 01/25/22 10:59 AM  Result Value Ref Range   Vancomycin Pk >60 (HH) 30 - 40 ug/mL    Comment: CRITICAL RESULT CALLED TO, READ BACK BY AND VERIFIED WITH: M.DYBING,RN 01/25/2022 AT 1300 AHUGHES Performed at [facility], [place] [address], [place], [place] [address]   Glucose, capillary     Status: Abnormal   Collection Time: 01/25/22 11:12 AM  Result  Value Ref Range   Glucose-Capillary 156 (H) 70 - 99 mg/dL    Comment: Glucose reference range applies only to samples taken after fasting for at least 8 hours.  Vancomycin, peak     Status: Abnormal   Collection Time: 01/25/22  1:06 PM  Result Value Ref Range   Vancomycin Pk 43 (H) 30 - 40 ug/mL    Comment: Performed at [facility], [place] [address] [place], [place] [address]  Glucose, capillary     Status: Abnormal   Collection Time: 01/25/22  4:05 PM  Result Value Ref Range   Glucose-Capillary 107 (H) 70 - 99 mg/dL    Comment: Glucose reference range applies only to samples taken after fasting for at least 8 hours.  Glucose, capillary     Status: Abnormal   Collection Time: 01/25/22  8:05 PM  Result Value Ref Range   Glucose-Capillary 104 (H) 70 - 99 mg/dL    Comment: Glucose reference range applies only to samples taken after fasting for at least 8 hours.  Glucose, capillary     Status: None   Collection Time: 01/25/22 11:40 PM  Result Value Ref Range   Glucose-Capillary 86 70 - 99 mg/dL    Comment: Glucose reference range applies only to samples taken after fasting for at least 8 hours.  Glucose, capillary     Status: None   Collection Time: 01/26/22  3:38 AM  Result Value Ref Range   Glucose-Capillary 85 70 - 99 mg/dL    Comment: Glucose reference range applies only to samples taken after fasting for at least 8 hours.  Basic metabolic panel     Status: Abnormal   Collection Time: 01/26/22  3:46 AM  Result Value Ref Range   Sodium 141 135 - 145 mmol/L   Potassium 3.5 3.5 - 5.1 mmol/L   Chloride 106 98 - 111 mmol/L   CO2 25 22 - 32 mmol/L   Glucose,  Bld 93 70 - 99 mg/dL    Comment: Glucose reference range applies only to samples taken after fasting for at least 8 hours.   BUN 31 (H) 8 - 23 mg/dL   Creatinine, Ser 1.49 (H) 0.61 - 1.24 mg/dL   Calcium 7.8 (L) 8.9 - 10.3 mg/dL   GFR, Estimated 49 (L) >60 mL/min    Comment: (NOTE) Calculated using the CKD-EPI Creatinine  Equation (2021)    Anion gap 10 5 - 15    Comment: Performed at [facility] [address], [place], [place] [address]  Glucose, capillary     Status: None   Collection Time: 01/26/22  8:12 AM  Result Value Ref Range   Glucose-Capillary 89 70 - 99 mg/dL    Comment: Glucose reference range applies only to samples taken after fasting for at least 8 hours.  Glucose, capillary     Status: None   Collection Time: 01/26/22 11:34 AM  Result Value Ref Range   Glucose-Capillary 92 70 - 99 mg/dL    Comment: Glucose reference range applies only to samples taken after fasting for at least 8 hours.    DG Wrist 2 Views Right  Result Date: 01/25/2022 CLINICAL DATA:  Swelling. EXAM: RIGHT WRIST - 2 VIEW COMPARISON:  None Available. FINDINGS: There is soft tissue swelling surrounding the wrist. Peripheral vascular calcifications are present. There is no acute fracture or dislocation. Joint spaces are well maintained. No cortical erosions or periosteal reaction identified. IMPRESSION: 1. Diffuse soft tissue swelling surrounding the wrist. 2. No acute bony abnormality. Electronically Signed   By: Ronney Asters M.D.   On: 01/25/2022 15:34   DG Chest Port 1 View  Result Date: 01/25/2022 CLINICAL DATA:  VENTILATOR DEPENDENCE. EXAM: PORTABLE CHEST 1 VIEW COMPARISON:  01/23/2022 FINDINGS: 0512 HOURS. Endotracheal tube tip is 3.6 cm above the base of the carina. The NG tube passes into the stomach although the distal tip position is not included on the film. Right IJ central line tip overlies the mid SVC level. The cardio pericardial silhouette is enlarged. Retrocardiac collapse/consolidation is similar to prior with left pleural effusion. IMPRESSION: No substantial interval change. Cardiomegaly with retrocardiac left base collapse/consolidation and left pleural effusion. Electronically Signed   By: Misty Stanley M.D.   On: 01/25/2022 09:07    Review of Systems  HENT:  Negative for ear discharge, ear pain,  hearing loss and tinnitus.   Eyes:  Negative for photophobia and pain.  Respiratory:  Negative for cough and shortness of breath.   Cardiovascular:  Negative for chest pain.  Gastrointestinal:  Negative for abdominal pain, nausea and vomiting.  Genitourinary:  Negative for dysuria, flank pain, frequency and urgency.  Musculoskeletal:  Positive for arthralgias (Right wrist) and back pain. Negative for myalgias and neck pain.  Neurological:  Negative for dizziness and headaches.  Hematological:  Does not bruise/bleed easily.  Psychiatric/Behavioral:  The patient is not nervous/anxious.    Blood pressure (!) 93/48, pulse (!) 111, temperature 97.8 F (36.6 C), temperature source Oral, resp. rate 18, height '5\' 6"'$  (1.676 m), weight 80.7 kg, SpO2 93 %. Physical Exam Constitutional:      General: He is not in acute distress.    Appearance: He is well-developed. He is not diaphoretic.  HENT:     Head: Normocephalic and atraumatic.  Eyes:     General: No scleral icterus.       Right eye: No discharge.        Left  eye: No discharge.     Conjunctiva/sclera: Conjunctivae normal.  Cardiovascular:     Rate and Rhythm: Normal rate and regular rhythm.  Pulmonary:     Effort: Pulmonary effort is normal. No respiratory distress.  Musculoskeletal:     Cervical back: Normal range of motion.     Comments: Right shoulder, elbow, wrist, digits- no skin wounds, wrist severe TTP, would only let me range about 10 degrees but actively resisted me doing so, dorsum of hand mildly edematous, no instability, no blocks to motion  Sens  Ax/R/M/U intact  Mot   Ax/ R/ PIN/ M/ AIN/ U grossly intact  Rad 2+  Skin:    General: Skin is warm and dry.  Neurological:     Mental Status: He is alert.  Psychiatric:        Mood and Affect: Mood normal.        Behavior: Behavior normal.     Assessment/Plan: Right wrist pain -- Will ask IR to tap wrist as cannot get MRI.    Lisette Abu, PA-C Orthopedic  Surgery [phone] 01/26/2022, 11:57 AM

## 2022-01-26 NOTE — Progress Notes (Signed)
[facility] Progress Note Patient Name: Shenouda Genova DOB: April 24, 1946 MRN: [id number]   Date of Service  01/26/2022  HPI/Events of Note  Patient with intermittent agitated delirium. QTC 492.  eICU Interventions  Haldol 1 mg iv x 1 PRN agitated delirium ordered.        Kerry Kass Meggan Dhaliwal 01/26/2022, 8:16 PM

## 2022-01-27 ENCOUNTER — Inpatient Hospital Stay (HOSPITAL_COMMUNITY): Payer: Medicare Other

## 2022-01-27 HISTORY — PX: IR REMOVAL TUN CV CATH W/O FL: IMG2289

## 2022-01-27 LAB — CBC
HCT: 35.5 % — ABNORMAL LOW (ref 39.0–52.0)
Hemoglobin: 11.1 g/dL — ABNORMAL LOW (ref 13.0–17.0)
MCH: 29.9 pg (ref 26.0–34.0)
MCHC: 31.3 g/dL (ref 30.0–36.0)
MCV: 95.7 fL (ref 80.0–100.0)
Platelets: 231 10*3/uL (ref 150–400)
RBC: 3.71 MIL/uL — ABNORMAL LOW (ref 4.22–5.81)
RDW: 17.1 % — ABNORMAL HIGH (ref 11.5–15.5)
WBC: 13.5 10*3/uL — ABNORMAL HIGH (ref 4.0–10.5)
nRBC: 0 % (ref 0.0–0.2)

## 2022-01-27 LAB — BASIC METABOLIC PANEL
Anion gap: 9 (ref 5–15)
BUN: 27 mg/dL — ABNORMAL HIGH (ref 8–23)
CO2: 24 mmol/L (ref 22–32)
Calcium: 8.1 mg/dL — ABNORMAL LOW (ref 8.9–10.3)
Chloride: 111 mmol/L (ref 98–111)
Creatinine, Ser: 1.4 mg/dL — ABNORMAL HIGH (ref 0.61–1.24)
GFR, Estimated: 53 mL/min — ABNORMAL LOW (ref >60)
Glucose, Bld: 86 mg/dL (ref 70–99)
Potassium: 3.8 mmol/L (ref 3.5–5.1)
Sodium: 144 mmol/L (ref 135–145)

## 2022-01-27 LAB — CULTURE, BLOOD (ROUTINE X 2)
Culture: NO GROWTH
Culture: NO GROWTH

## 2022-01-27 LAB — GLUCOSE, CAPILLARY
Glucose-Capillary: 79 mg/dL (ref 70–99)
Glucose-Capillary: 90 mg/dL (ref 70–99)
Glucose-Capillary: 95 mg/dL (ref 70–99)
Glucose-Capillary: 114 mg/dL — ABNORMAL HIGH (ref 70–99)

## 2022-01-27 MED ORDER — LIDOCAINE HCL 1 % IJ SOLN
INTRAMUSCULAR | Status: AC
  Filled 2022-01-27: qty 20

## 2022-01-27 MED ORDER — LACTATED RINGERS IV BOLUS
500.0000 mL | Freq: Once | INTRAVENOUS | Status: AC
Start: 2022-01-27 — End: 2022-01-27
  Administered 2022-01-27: 500 mL via INTRAVENOUS

## 2022-01-27 MED ORDER — AMIODARONE HCL 200 MG PO TABS
200.0000 mg | ORAL_TABLET | Freq: Every day | ORAL | Status: DC
  Administered 2022-01-27 – 2022-01-29 (×3): 200 mg via ORAL
  Filled 2022-01-27 (×3): qty 1

## 2022-01-27 MED ORDER — ENOXAPARIN SODIUM 100 MG/ML IJ SOSY
90.0000 mg | PREFILLED_SYRINGE | Freq: Two times a day (BID) | INTRAMUSCULAR | Status: DC
  Administered 2022-01-27 – 2022-01-28 (×4): 90 mg via SUBCUTANEOUS
  Filled 2022-01-27 (×5): qty 0.9

## 2022-01-27 NOTE — Progress Notes (Signed)
[name] for Infectious Disease  Date of Admission:  01/28/2022           Reason for visit: Follow up on sepsis  Current antibiotics: Vancomycin 7/6--present Cefepime 7/6--present   ASSESSMENT:    76 y.o. male admitted with:  Left lower lobe pneumonia and acute hypoxic respiratory failure: Patient was extubated on 01/25/22 and appears stable on nasal cannula.  Today is day #6 of vancomycin and cefepime. Planning for 7 day course of HCAP coverage. Discitis/OM L3-4: Patient has been on IV antibiotics related to this since his June hospitalization with planned end date of antibiotics 02/05/22.  Unlikely to be contributing to current presentation. Vascular access: Patient has a tunneled central line in place.  Blood cultures are negative but this is not unexpected given that he presented after several weeks of antibiotics. Right wrist pain: Seen by orthopedics.  Pending IR aspiration for diagnostic purposes and also started empirically on colchicine. Acute kidney injury: In the setting of CKD3.  Creatinine has slowly been improving.   RECOMMENDATIONS:    Continue vancomycin Continue cefepime Will have IR evaluate to remove tunneled line since it is difficult to definitively exclude a line infection despite negative blood cx since he had been on antibiotics prior to admission Appreciate orthopedics evaluation regarding right wrist.  Agree with attempt at aspiration for diagnostic purposes Lab monitoring Will follow   Principal Problem:   Septic shock ([place]) Active Problems:   Acute respiratory failure with hypoxia and hypercapnia (HCC)   COPD (chronic obstructive pulmonary disease) (HCC)   Acute kidney injury ([place])   Discitis of lumbar region   Pressure injury of skin   HCAP (healthcare-associated pneumonia)   Right wrist pain    MEDICATIONS:    Scheduled Meds:  arformoterol  15 mcg Nebulization BID   budesonide (PULMICORT) nebulizer solution  0.5 mg Nebulization  BID   Chlorhexidine Gluconate Cloth  6 each Topical Daily   colchicine  0.6 mg Oral BID   enoxaparin (LOVENOX) injection  1 mg/kg Subcutaneous Q12H   mupirocin ointment  1 Application Nasal BID   potassium chloride  40 mEq Oral Daily   sodium chloride flush  10-40 mL Intracatheter Q12H   Continuous Infusions:  sodium chloride Stopped (01/23/22 1353)   sodium chloride 50 mL/hr at 01/27/22 0600   ceFEPime (MAXIPIME) IV Stopped (01/27/22 0002)   norepinephrine (LEVOPHED) Adult infusion Stopped (01/26/22 0818)   vancomycin Stopped (01/26/22 2233)   PRN Meds:.albuterol, fentaNYL (SUBLIMAZE) injection, mouth rinse, sodium chloride flush  SUBJECTIVE:   24 hour events:  No major events Delirium noted with agitation. Given Haldol Afebrile Stable oxygenation on 4L  Remains off pressors Seen by ortho for wrist pain, pending possible IR arthrocentesis.  More lethargic this morning after Haldol last PM.  Right wrist symptoms appear better today.  Review of Systems  All other systems reviewed and are negative.     OBJECTIVE:   Blood pressure 106/69, pulse (!) 112, temperature 97.7 F (36.5 C), temperature source Oral, resp. rate (!) 22, height '5\' 6"'$  (1.676 m), weight 99.1 kg, SpO2 100 %. Body mass index is 35.26 kg/m.  Physical Exam Constitutional:      Comments: He is more lethargic this morning.  No acute distress.  Able to answer questions.   HENT:     Head: Normocephalic and atraumatic.  Eyes:     Extraocular Movements: Extraocular movements intact.     Conjunctiva/sclera: Conjunctivae normal.  Cardiovascular:  Rate and Rhythm: Tachycardia present.  Pulmonary:     Effort: Pulmonary effort is normal. No respiratory distress.     Comments: On nasal cannula.  Abdominal:     General: There is no distension.     Palpations: Abdomen is soft.  Musculoskeletal:     Cervical back: Normal range of motion and neck supple.     Comments: Right wrist remains tender to palpation  but with some improvement since yesterday.  He allowed me to move the wrist minimally today compared to complete opposition yesterday.  Neurological:     General: No focal deficit present.      Lab Results: Lab Results  Component Value Date   WBC 11.6 (H) 01/25/2022   HGB 11.4 (L) 01/25/2022   HCT 37.6 (L) 01/25/2022   MCV 96.9 01/25/2022   PLT 226 01/25/2022    Lab Results  Component Value Date   NA 141 01/26/2022   K 3.5 01/26/2022   CO2 25 01/26/2022   GLUCOSE 93 01/26/2022   BUN 31 (H) 01/26/2022   CREATININE 1.49 (H) 01/26/2022   CALCIUM 7.8 (L) 01/26/2022   GFRNONAA 49 (L) 01/26/2022   GFRAA 61 07/16/2020    Lab Results  Component Value Date   ALT 91 (H) 01/24/2022   AST 96 (H) 01/24/2022   ALKPHOS 84 01/24/2022   BILITOT 1.1 01/24/2022       Component Value Date/Time   CRP 15.9 (H) 12/27/2021 1917       Component Value Date/Time   ESRSEDRATE 31 (H) 12/27/2021 1917     I have reviewed the micro and lab results in Epic.  Imaging: [place] RT UPPER EXTREM LTD SOFT TISSUE NON VASCULAR  Result Date: 01/26/2022 CLINICAL DATA:  Right wrist pain EXAM: ULTRASOUND RIGHT UPPER EXTREMITY LIMITED TECHNIQUE: Ultrasound examination of the upper extremity soft tissues was performed in the area of clinical concern. COMPARISON:  None Available. FINDINGS: Based on the cine sequences, there seems to be a tendon or similar structure with complex surrounding fluid signal intensity in the region of concern. This seems to be just superficial to a bony structure. IMPRESSION: 1. Superficial to a bony structure, there is some complex fluid and echogenicity surrounding what seems to be a tendon. Presuming this is along the first extensor compartment (the technologist reports that it is along the lateral wrist), it could represent de Quervain tenosynovitis. This could be further localized and characterized with MRI if clinically warranted. Electronically Signed   By: Van Clines M.D.    On: 01/26/2022 18:24   VAS [place] UPPER EXTREMITY VENOUS DUPLEX  Result Date: 01/26/2022 UPPER VENOUS STUDY  Patient Name:  [name] [facility]  Date of Exam:   01/26/2022 Medical Rec #: [id number]                 Accession #:    [id number] Date of Birth: [birth year]                Patient Gender: M Patient Age:   63 years Exam Location:  [facility] Procedure:      VAS [place] UPPER EXTREMITY VENOUS DUPLEX Referring Phys: Elisabeth Cara SOOD --------------------------------------------------------------------------------  Indications: Pain Performing Technologist: Bobetta Lime BS, RVT  Examination Guidelines: A complete evaluation includes B-mode imaging, spectral Doppler, color Doppler, and power Doppler as needed of all accessible portions of each vessel. Bilateral testing is considered an integral part of a complete examination. Limited examinations for reoccurring indications may be performed as noted.  Right  Findings: +----------+------------+---------+-----------+----------+-------+ RIGHT     CompressiblePhasicitySpontaneousPropertiesSummary +----------+------------+---------+-----------+----------+-------+ IJV           Full       Yes       Yes                      +----------+------------+---------+-----------+----------+-------+ Subclavian    Full       Yes       Yes                      +----------+------------+---------+-----------+----------+-------+ Axillary      Full       Yes       Yes                      +----------+------------+---------+-----------+----------+-------+ Brachial      Full                                          +----------+------------+---------+-----------+----------+-------+ Radial        Full                                          +----------+------------+---------+-----------+----------+-------+ Ulnar         Full                                          +----------+------------+---------+-----------+----------+-------+  Cephalic      Full                                          +----------+------------+---------+-----------+----------+-------+ Basilic       Full                                          +----------+------------+---------+-----------+----------+-------+ Difficult exam due to patient's inability to move arm away from body and could not straighten from bent position.  Left Findings: +----------+------------+---------+-----------+----------+-------+ LEFT      CompressiblePhasicitySpontaneousPropertiesSummary +----------+------------+---------+-----------+----------+-------+ Subclavian    Full       Yes       Yes                      +----------+------------+---------+-----------+----------+-------+  Summary:  Right: No evidence of deep vein thrombosis in the upper extremity. No evidence of superficial vein thrombosis in the upper extremity.  Left: No evidence of thrombosis in the subclavian.  *See table(s) above for measurements and observations.    Preliminary    DG Swallowing Func-Speech Pathology  Result Date: 01/26/2022 Table formatting from the original result was not included. Images from the original result were not included. Objective Swallowing Evaluation: Type of Study: Bedside Swallow Evaluation  Patient Details Name: Autry Prust MRN: [id number] Date of Birth: 1946/04/14 Today's Date: 01/26/2022 Time: SLP Start Time (ACUTE ONLY): 1338 -SLP Stop Time (ACUTE ONLY): 1348 SLP Time Calculation (min) (ACUTE ONLY): 10 min Past Medical History: Past Medical History: Diagnosis Date  Acute lower GI bleeding 02/15/2017  Atrial  fibrillation ([place])   Cardiomyopathy   EF55% 11/14<<35%   Cardiomyopathy ([place])   CHF (congestive heart failure) (HCC)   COPD (chronic obstructive pulmonary disease) ([place])   Difficult airway for intubation   per telephone encounter notation  Dyspnea   Essential hypertension   GERD (gastroesophageal reflux disease)   Gout   Hepatitis   Possible history   History of atrial flutter   Ablation 2012  Hypertension   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 Ali Lowe  Severe mitral regurgitation   Substance abuse ([place])   History of alcohol; hx cocaine, heroin, crack use  Syncope   Tubular adenoma of colon 08/2014 Past Surgical History: Past Surgical History: Procedure Laterality Date  CARDIAC CATHETERIZATION    CARDIAC ELECTROPHYSIOLOGY MAPPING AND ABLATION  08/2010  [name] 09/07/2010 (12/14/2012)  CARDIAC SURGERY    CARDIOVERSION N/A 10/16/2021  Procedure: CARDIOVERSION;  Surgeon: Larey Dresser, MD;  Location: [place];  Service: Cardiovascular;  Laterality: N/A;  COLONOSCOPY    15-20 years ago had colon in [place] N/A 02/12/2020  Procedure: COLONOSCOPY WITH PROPOFOL;  Surgeon: Ladene Artist, MD;  Location: [facility] ENDOSCOPY;  Service: Endoscopy;  Laterality: N/A;  CORONARY STENT INTERVENTION N/A 10/10/2020  Procedure: CORONARY STENT INTERVENTION;  Surgeon: Lorretta Harp, MD;  Location: [place] CV LAB;  Service: Cardiovascular;  Laterality: N/A;  EXCISIONAL HEMORRHOIDECTOMY  1970's  IR FLUORO GUIDE CV LINE RIGHT  01/05/2022  IR LUMBAR DISC ASPIRATION W/IMG GUIDE  12/29/2021  IR LUMBAR DISC ASPIRATION W/IMG GUIDE  12/29/2021  IR US GUIDE VASC ACCESS RIGHT  01/05/2022  LOOP RECORDER IMPLANT N/A 08/23/2013  Procedure: LOOP RECORDER IMPLANT;  Surgeon: Deboraha Sprang, MD;  Location: [facility] CATH LAB;  Service: Cardiovascular;  Laterality: N/A;  MITRAL VALVE REPAIR N/A 11/20/2021  Procedure: MITRAL VALVE REPAIR;  Surgeon: [name], MD;  Location: [place] CV LAB;  Service: Cardiovascular;  Laterality: N/A;  MULTIPLE EXTRACTIONS WITH ALVEOLOPLASTY Bilateral 01/24/2016  Procedure: MULTIPLE EXTRACTION WITH ALVEOLOPLASTY BILATERAL;  Surgeon: Diona Browner, DDS;  Location:  [place];  Service: Oral Surgery;  Laterality: Bilateral;  MULTIPLE TOOTH EXTRACTIONS  01/24/2016  MULTIPLE EXTRACTION WITH ALVEOLOPLASTY BILATERAL (Bilateral)  POLYPECTOMY  02/12/2020  Procedure: POLYPECTOMY;  Surgeon: Ladene Artist, MD;  Location: [facility] ENDOSCOPY;  Service: Endoscopy;;  RIGHT HEART CATH N/A 10/03/2021  Procedure: RIGHT HEART CATH;  Surgeon: [name], MD;  Location: [place] CV LAB;  Service: Cardiovascular;  Laterality: N/A;  RIGHT/LEFT HEART CATH AND CORONARY ANGIOGRAPHY N/A 10/09/2020  Procedure: RIGHT/LEFT HEART CATH AND CORONARY ANGIOGRAPHY;  Surgeon: Burnell Blanks, MD;  Location: [place] CV LAB;  Service: Cardiovascular;  Laterality: N/A;  TEE WITHOUT CARDIOVERSION N/A 10/16/2021  Procedure: TRANSESOPHAGEAL ECHOCARDIOGRAM (TEE);  Surgeon: Larey Dresser, MD;  Location: [facility] ENDOSCOPY;  Service: Cardiovascular;  Laterality: N/A;  TEE WITHOUT CARDIOVERSION N/A 11/07/2021  Procedure: TRANSESOPHAGEAL ECHOCARDIOGRAM (TEE);  Surgeon: Werner Lean, MD;  Location: [facility] ENDOSCOPY;  Service: Cardiovascular;  Laterality: N/A;  TEE WITHOUT CARDIOVERSION N/A 11/20/2021  Procedure: TRANSESOPHAGEAL ECHOCARDIOGRAM (TEE);  Surgeon: [name], MD;  Location: [place] CV LAB;  Service: Cardiovascular;  Laterality: N/A; HPI: 76 yo male was in hospital from 6/09 to 6/19 with L3-4 discitis and osteomyelitis discharged on 6 weeks of teflaro sent from SNF with dyspnea  and hypoxia with SpO2 60% on room air.  Found to have white out of Lt lung on xray.  Required intubation in ER.  Started on pressors and antibiotics.  No data recorded  Recommendations for follow up therapy are one component of a multi-disciplinary discharge planning process, led by the attending physician.  Recommendations may be updated based on patient status, additional functional criteria and insurance authorization. Assessment / Plan / Recommendation   01/26/2022   2:00 PM Clinical Impressions Clinical  Impression Pt demonstrates normal swallowing without any aspiration or difficulty. Pt unable to swallow pill with liquids or puree, no esophageal sweep. RN reports pt was able to swallow pills with water at bedside. Pt to continue a regular diet and thin liquids. Will sign off.     01/26/2022   2:00 PM Treatment Recommendations Treatment Recommendations No treatment recommended at this time      No data to display      01/26/2022   2:00 PM Diet Recommendations SLP Diet Recommendations Regular solids;Thin liquid Medication Administration Whole meds with liquid     01/26/2022   2:00 PM Other Recommendations Oral Care Recommendations Oral care BID Follow Up Recommendations No SLP follow up    No data to display        01/26/2022   2:00 PM Oral Phase Oral Phase [facility]    01/26/2022   2:00 PM Pharyngeal Phase Pharyngeal Phase WFL     No data to display    DeBlois, Katherene Ponto 01/26/2022, 2:08 PM                     DG Wrist 2 Views Right  Result Date: 01/25/2022 CLINICAL DATA:  Swelling. EXAM: RIGHT WRIST - 2 VIEW COMPARISON:  None Available. FINDINGS: There is soft tissue swelling surrounding the wrist. Peripheral vascular calcifications are present. There is no acute fracture or dislocation. Joint spaces are well maintained. No cortical erosions or periosteal reaction identified. IMPRESSION: 1. Diffuse soft tissue swelling surrounding the wrist. 2. No acute bony abnormality. Electronically Signed   By: Ronney Asters M.D.   On: 01/25/2022 15:34     Imaging independently reviewed in Epic.    Raynelle Highland for Infectious Disease [facility] [phone] pager 01/27/2022, 6:25 AM

## 2022-01-27 NOTE — Progress Notes (Signed)
Tunneled central venous catheter removed per order. Please see full procedure dictation under Imaging tab in Epic.  Soyla Dryer, [place] [phone] 01/27/2022, 11:48 AM

## 2022-01-27 NOTE — Progress Notes (Signed)
Pt seen and examined.  Patient unable to provide much interval history or answer questions secondary to somnolence.  Per nursing, patient has been somnolent since haldol given overnight for agitation. Pt denies any pain in the wrist and says this resolved yesterday.  Gen: Somnolent but arousable, can provide very short answers, follows some commands Pulm: Normal WOB. CV: Normal rate, BUE warm and well perfused RUE: Diffuse edema from dorsal hand through forearm, no erythema, seemingly no pain w/ passive flexion/extension of wrist though patient holds arm in tensed position, no TTP at radial aspect of wrist or at SL interval but does wince in pain w/ palpation of ulnar head and ulnar fovea  76 yo M w/ right wrist pain and diffuse, pitting edema of hand, wrist, and forearm.  On my exam today, his pain seems localized to the ulnar aspect of the wrist.  Review of his previous x-ray does show some mild chondrocalcinosis at the ulnocarpal joint.  I suspect gout/pseudogout rather than septic wrist given lack of erythema, focal swelling, pain w/ flexion/extension, and history of gout w/ possible chondrocalcinosis.  Patient on colchicine currently.  Plan for wrist aspiration by IR.  Recommend sending fluid for crystals as well as gram stain/culture.  Sherilyn Cooter, M.D. [place]

## 2022-01-27 NOTE — Progress Notes (Addendum)
NAME:  Jontrell Bushong, MRN:  [id number], DOB:  [birth year], LOS: 5 ADMISSION DATE:  01/18/2022, CONSULTATION DATE:  02/02/2022 REFERRING MD:  Dr. Vanita Panda, CHIEF COMPLAINT:  SOB/ hypoxia   History of Present Illness:  76 yo male was in hospital from 6/09 to 6/19 with L3-4 discitis and osteomyelitis discharged on 6 weeks of teflaro sent from SNF with dyspnea and hypoxia with SpO2 60% on room air.  Found to have white out of Lt lung on xray.  Required intubation in ER.  Started on pressors and antibiotics.  PCCM consulted to admit to ICU.  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 Remains tachycardic to mid 110s Agitated delirium overnight, got Haldol x1.   Objective   Blood pressure 106/69, pulse (!) 112, temperature 97.7 F (36.5 C), temperature source Oral, resp. rate (!) 22, height '5\' 6"'$  (1.676 m), weight 99.1 kg, SpO2 100 %.        Intake/Output Summary (Last 24 hours) at 01/27/2022 0720 Last data filed at 01/27/2022 0600 Gross per 24 hour  Intake 2580.48 ml  Output 535 ml  Net 2045.48 ml    Filed Weights   01/24/22 0500 01/25/22 0500 01/27/22 0500  Weight: 91.5 kg 80.7 kg 99.1 kg   Examination:  General - Awake, engages interviewer, NAD Eyes - PERRLA ENT - [place] in place Cardiac - irregularly irregular, tachycardic, no murmur, rub, or gallop Chest - good air  movement, diminished lung sounds in LLL Abdomen - soft, non-tender, mild distention with rigidity Extremities - patient resists examination of R wrist, noted to remain warm to touch Skin - without rash or excoriation Neuro - follows commands, moving all extremities, R wrist movement limited by pain Psych - mood and affect appropriate, anxious   Resolved Hospital Problem list   Septic shock Acute metabolic encephalopathy from sepsis  Assessment & Plan:   Lt lower lobe bacterial HCA (POA). Admission blood cx ngtd. Septic shock resolved. BP improving, off pressors today. Now stable for transfer out of ICU.  - Abx day 6, currently on vancomycin and cefepime - ID following, rec abx x7 days - As is off pressors, can remove tunneled line - Transfer to progressive, IMTS patient   R wrist pain and swelling Ortho following, recommend IR arthrocentesis. Area of complex fluid and echogenicity surrounding what seems to be tendon on [place] yesterday.  - on empiric colchicine - IR arthrocentesis  L3-L4 discitis in June 2023. - he has shrapnel in his head, so radiology recommending against repeat MRI of lumbar spine - CT abd/pelvis from 01/23/22 didn't show any significant osseous findings - uncertain whether this was contributing to his current illness, but seems less likely  Acute hypoxic/hypercapnic respiratory failure. Hx of COPD. - now extubated, stable on 4L - f/u CXR intermittently - continue brovana, pulmicort   Hx of A fib. Valvular heart disease. CAD, Chronic diastolic CHF. In A fib, rates  in mid-110s.  - monitor on telemetry - add PO amiodarone '200mg'$  daily - full dose lovenox for now - s/p lasix 40 mg IV x 1 on 7/09   Chronic pain. - prn fentanyl   AKI from ATN 2nd to sepsis. Cr now at baseline - f/u BMET   Pressure injury. - Rt heel, deep tissue injury, not POA  Best Practice (right click and "Reselect all SmartList Selections" daily)   Diet/type: NPO DVT prophylaxis:  SCD GI prophylaxis: N/A Lines: Central line- PTA RIJ tunneled single lumen CVL Foley:  Yes, and it is still needed Code Status:  full code Last date of multidisciplinary goals of care discussion [pending]  Pearla Dubonnet, MD 7:20 AM

## 2022-01-27 NOTE — Progress Notes (Addendum)
Patient currently refusing to eat breakfast nor lunch I have changed his diet to pureed to see if he will like this better since he has stated he would eat some pudding Patient ate 75% pureed dinner.

## 2022-01-27 NOTE — Progress Notes (Signed)
[place] Progress Note Patient Name: [name] DOB: 19-Oct-1945 MRN: [id number]   Date of Service  01/27/2022  HPI/Events of Note  Urinary retention - Bladder scan with 350+ mL.   eICU Interventions  Plan: I/O cath PRN.      Intervention Category Major Interventions: Other:  Lysle Dingwall 01/27/2022, 11:50 PM

## 2022-01-28 LAB — C DIFFICILE (CDIFF) QUICK SCRN (NO PCR REFLEX)
C Diff antigen: POSITIVE — AB
C Diff toxin: NEGATIVE

## 2022-01-28 LAB — CBC WITH DIFFERENTIAL/PLATELET
Abs Immature Granulocytes: 0.1 10*3/uL — ABNORMAL HIGH (ref 0.00–0.07)
Basophils Absolute: 0.1 10*3/uL (ref 0.0–0.1)
Basophils Relative: 1 %
Eosinophils Absolute: 0.3 10*3/uL (ref 0.0–0.5)
Eosinophils Relative: 3 %
HCT: 37.4 % — ABNORMAL LOW (ref 39.0–52.0)
Hemoglobin: 11.6 g/dL — ABNORMAL LOW (ref 13.0–17.0)
Immature Granulocytes: 1 %
Lymphocytes Relative: 8 %
Lymphs Abs: 1 10*3/uL (ref 0.7–4.0)
MCH: 30.1 pg (ref 26.0–34.0)
MCHC: 31 g/dL (ref 30.0–36.0)
MCV: 96.9 fL (ref 80.0–100.0)
Monocytes Absolute: 1.2 10*3/uL — ABNORMAL HIGH (ref 0.1–1.0)
Monocytes Relative: 10 %
Neutro Abs: 9.3 10*3/uL — ABNORMAL HIGH (ref 1.7–7.7)
Neutrophils Relative %: 77 %
Platelets: 259 10*3/uL (ref 150–400)
RBC: 3.86 MIL/uL — ABNORMAL LOW (ref 4.22–5.81)
RDW: 17.2 % — ABNORMAL HIGH (ref 11.5–15.5)
WBC: 12 10*3/uL — ABNORMAL HIGH (ref 4.0–10.5)
nRBC: 0 % (ref 0.0–0.2)

## 2022-01-28 LAB — BASIC METABOLIC PANEL
Anion gap: 5 (ref 5–15)
BUN: 25 mg/dL — ABNORMAL HIGH (ref 8–23)
CO2: 22 mmol/L (ref 22–32)
Calcium: 7.9 mg/dL — ABNORMAL LOW (ref 8.9–10.3)
Chloride: 116 mmol/L — ABNORMAL HIGH (ref 98–111)
Creatinine, Ser: 1.47 mg/dL — ABNORMAL HIGH (ref 0.61–1.24)
GFR, Estimated: 50 mL/min — ABNORMAL LOW (ref >60)
Glucose, Bld: 108 mg/dL — ABNORMAL HIGH (ref 70–99)
Potassium: 4 mmol/L (ref 3.5–5.1)
Sodium: 143 mmol/L (ref 135–145)

## 2022-01-28 LAB — GLUCOSE, CAPILLARY
Glucose-Capillary: 102 mg/dL — ABNORMAL HIGH (ref 70–99)
Glucose-Capillary: 132 mg/dL — ABNORMAL HIGH (ref 70–99)
Glucose-Capillary: 114 mg/dL — ABNORMAL HIGH (ref 70–99)
Glucose-Capillary: 132 mg/dL — ABNORMAL HIGH (ref 70–99)
Glucose-Capillary: 110 mg/dL — ABNORMAL HIGH (ref 70–99)

## 2022-01-28 MED ORDER — SODIUM CHLORIDE 0.9 % IV SOLN
2.0000 g | INTRAVENOUS | Status: DC
  Administered 2022-01-29: 2 g via INTRAVENOUS
  Filled 2022-01-28: qty 20

## 2022-01-28 MED ORDER — DAPAGLIFLOZIN PROPANEDIOL 10 MG PO TABS
10.0000 mg | ORAL_TABLET | Freq: Every day | ORAL | Status: DC
  Administered 2022-01-29: 10 mg via ORAL
  Filled 2022-01-28 (×2): qty 1

## 2022-01-28 MED ORDER — TORSEMIDE 20 MG PO TABS
20.0000 mg | ORAL_TABLET | Freq: Every day | ORAL | Status: DC
  Administered 2022-01-28 – 2022-01-29 (×2): 20 mg via ORAL
  Filled 2022-01-28 (×2): qty 1

## 2022-01-28 MED ORDER — DOXYCYCLINE HYCLATE 100 MG PO TABS
100.0000 mg | ORAL_TABLET | Freq: Two times a day (BID) | ORAL | Status: DC
Start: 2022-01-29 — End: 2022-01-29
  Administered 2022-01-29: 100 mg via ORAL
  Filled 2022-01-28: qty 1

## 2022-01-28 MED ORDER — ATORVASTATIN CALCIUM 80 MG PO TABS: 80.0000 mg | ORAL_TABLET | Freq: Every day | ORAL | Status: DC

## 2022-01-28 NOTE — Progress Notes (Signed)
Mobility Specialist Progress Note   01/28/22 1100  Mobility  Activity Stood at bedside  Level of Assistance Maximum assist, patient does 25-49%  Assistive Device Front wheel walker  Activity Response Tolerated well  $Mobility charge 1 Mobility   Pre Mobility: 106 HR, 100/69 BP, 95% SpO2 During Mobility: 122 HR, 123/73 BP, 98% SpO2 Post Mobility:  112 HR, 97% SpO2  Pt received in bed lethargic and slightly difficult to keep aroused. Pt also c/o having mild back pain(4/10) but agreeable to mobility. Required mod A for bed mobility to EOB d/t general weakness and max A to stand. Pt able to stand for ~10secs before announcing that their legs were weak and prematurely sitting down. Pt deferring another stand attempt or to get in the chair d/t bilat knee pain from first stand. Pt returned back supine in bed after session with all needs met, call bell in reach and bed alarm on.   Holland Falling Mobility Specialist [place] #:  [phone] Acute Rehab Office:  [phone]

## 2022-01-28 NOTE — Progress Notes (Cosign Needed Addendum)
Transfer summary: Presented on 7/6 with acute hypoxic respiratory failure initially started on BiPAP, subsequently intubated, and septic shock secondary to left lower lobe pneumonia, started on IV Levophed and admitted to the ICU.  He was successfully extubated on 7/9.  Briefly required pressor support on 7/10.  Of note patient had a recent admission for L3/L4 discitis/osteomyelitis on antibiotics.  Subjective: Overnight patient had urinary retention. No complaints this morning. States he is having loose stools and lots of gas. Denies any abdominal pain. States his right wrist pain has improved.   Objective:  Vital signs in last 24 hours: Vitals:   01/27/22 2348 01/28/22 0402 01/28/22 0711 01/28/22 0947  BP: 108/62 102/81 117/68   Pulse: (!) 105 (!) 105 (!) 106   Resp: '18 16 18   '$ Temp: (!) 97.5 F (36.4 C) 97.8 F (36.6 C) 97.7 F (36.5 C)   TempSrc: Oral  Oral   SpO2: 99% 100% 96% 100%  Weight:      Height:       Physical Exam General: alert, appears stated age, in no acute distress, upper extremities were shaking on exam HEENT: Normocephalic, atraumatic, EOM intact, conjunctiva normal CV: Regular rate and rhythm, no murmurs rubs or gallops Pulm: Clear to auscultation bilaterally, normal work of breathing Abdomen: Soft, nondistended, bowel sounds present, no tenderness to palpation MSK: 2+ pitting edema of bilateral LE  Skin: Warm and dry Neuro: Alert and oriented x3 but intermittently somnolent on exam  Assessment/Plan:  Active Problems:   Acute respiratory failure with hypoxia and hypercapnia (HCC)   COPD (chronic obstructive pulmonary disease) (HCC)   Acute kidney injury (HCC)   Discitis of lumbar region   Septic shock (HCC)   Pressure injury of skin   HAP (hospital-acquired pneumonia)   Right wrist pain  76 year old male with COPD, CAD, hypertension and CKD stage IIIb who presented with acute hypoxic respiratory failure initially started on BiPAP and septic shock  secondary to a left lower lobe pneumonia started on IV Levophed and admitted to the ICU.  Left lower lobe pneumonia Acute hypoxic respiratory failure Septic shock, resolved Patient presented on 7/6 with acute hypoxic respiratory failure and septic shock in the setting of left lower lobe pneumonia, requiring intubation.  Patient was successfully extubated on 7/9 and remained stable on supplemental oxygen via nasal cannula, currently on 2 L.  He was weaned off of pressors but briefly restarted on 7/10 due to soft systolic blood pressures.  He was started on vancomycin and cefepime for 7-day course for HAP coverage. -ID following, appreciate recommendations -Complete cefepime and vancomycin for total of 7 days -Blood cultures no growth to date -Continue Brovana and Pulmicort -Continue pulmonary hygiene  L3/L4 discitis/osteomyelitis Patient was recently admitted for discitis/osteomyelitis on IV antibiotics since June hospitalization with planned end date of antibiotics 7/20.  CT abdomen pelvis from 7/7 did not show any significant osseous findings.  He does have a tunneled central line in place with negative blood cultures which may be in the setting of prolonged antibiotic use.  Infectious disease recommended removing tunneled line due to difficulty excluding line infection, which was removed yesterday 7/11.  Of note patient has shrapnel in his head, radiology recommending against repeat MRI of lumbar spine. -Infectious disease following, appreciate recommendations -Tunneled central line removed 7/11 -Continue cefepime and doxycycline starting tomorrow  -Will resume ceftriaxone and doxycycline tomorrow  Right wrist pain Hx of Gout Patient was evaluated by orthopedics yesterday who suspects this is gout/pseudogout versus septic  joint which is less likely.  Started on colchicine.  Patient does note some improvement of his right wrist pain.  Pending aspiration for further evaluation. -Follow-up IR  arthrocentesis -Continue colchicine  Loose stools Patient with 4 documented loose stools.  Denies any abdominal pain or cramping.  Patient has remained afebrile since 7/6 and leukocyte count has improved from 15-12.  This may be associated with a side effect of antibiotics versus C. difficile versus opiate withdrawal.  Will check for C. difficile and monitor for opiate withdrawal. -COWS q6h -Follow-up C. difficile testing  Paroxysmal atrial fibrillation on Eliquis Persistent atrial flutter status post ablation in 2012 with recurrent A-fib requiring status post DCCV 09/2021.  Patient noted to have A-fib with RVR on 7/7, status post amiodarone bolus.  He was transitioned to oral amiodarone 200 mg daily and Lovenox.  Was previously on Eliquis. -Continue telemetry -Amiodarone 200 mg daily -Continue Lovenox  Acute kidney injury, resolved  CKD stage IIIb Urinary retention Creatinine 1.47, has slowly been improving, appears to be nearing baseline.  Patient noted to have urinary retention overnight with 350+ mL on bladder scan, as needed in and out caths ordered.  Continue to monitor.  Will resume home torsemide. -Trend I's and O's -Trend BMP -Avoid nephrotoxic agents  Normocytic anemia Hemoglobin 11.6.  Likely in the setting of acute illness.   -Continue to monitor.  Chronic combined systolic/diastolic heart failure CAD s/p PCI Valvular disease Patient with a history of CAD/NSTEMI status post PCI to mid LAD 09/2020.  Long history of combined CHF dating back to 20 2012 with LVEF found to be 35% at the time thought to be secondary to polysubstance abuse and atrial flutter.  Most recent echocardiogram on 6/5 showed EF of 55 to 60% with no regional wall abnormalities, mild left ventricular hypertrophy.  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 Home medications include  atorvastatin, Farxiga, metoprolol and torsemide. -Resume torsemide and farxiga  -Risk of severe myopathy with atorvastatin and colchicine, will resume atorvastatin once colchicine course completed  Chronic pain  Was previously on Suboxone, last prescribed in February of this year per PDMP.  No home pain medications listed.  Continue to monitor. -COWS q6h   Pressure injury Bilateral heel injuries. Continue to offload as able  Prior to Admission Living Arrangement: Home Anticipated Discharge Location: Home Barriers to Discharge: Clinical improvement  Dispo: Anticipated discharge pending clinical improvement.   Latiya Navia N, DO 01/28/2022, 10:19 AM After 5pm on weekdays and 1pm on weekends: On Call pager [phone]

## 2022-01-28 NOTE — Plan of Care (Signed)

## 2022-01-28 NOTE — Care Management Important Message (Signed)
Important Message  Patient Details  Name: [name] MRN: [id number] Date of Birth: [birth year]   Medicare Important Message Given:  Yes     Orbie Pyo 01/28/2022, 3:56 PM

## 2022-01-28 NOTE — Evaluation (Signed)
Physical Therapy Evaluation Patient Details Name: [name] MRN: [id number] DOB: [birth year] Today's Date: 01/28/2022  History of Present Illness  76 year old male with recent admission for discitis/osteomyelitis on IV antibiotics who presented with acute hypoxic respiratory failure in the setting of aspiration pneumonia; R wrist pain as well, Ortho on board and plan for aspiration;  has a past medical history of Acute lower GI bleeding (02/15/2017), Atrial fibrillation ([place]), Cardiomyopathy, Cardiomyopathy ([place]), CHF (congestive heart failure) ([place]), COPD (chronic obstructive pulmonary disease) ([place]), Difficult airway for intubation, Dyspnea, Essential hypertension, GERD (gastroesophageal reflux disease), Gout, Hepatitis, History of atrial flutter, Hypertension, 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), Syncope, and Tubular adenoma of colon (08/2014).  Clinical Impression   Pt admitted with above diagnosis. Admitted from SNF where he was undergoing rehab; Normally Lives at home with family, in a single-level home with a few steps to enter; Prior to June admission, pt was able to manage independently, occasionally using a cane for amb; Presents to PT with significant generalized weakness, AMS with decr arousal, slow, stiff movement of hips and shoulders, difficutly with weight shifting in supine and in sitting, leading to significant functional dependencies; Requires Mod/Max assist for rolling (assisted with hygiene after a BM); 2 person assist with bed mobiltiy getting to EOB; Stood briefly from EOB with +2 Max assist, use of bed pad, and knees blocked for stability;  Recommend return to SNF to continue post-acute rehabilitation to maximize independence and safety with mobility and  ADLs; Pt currently with functional limitations due to the deficits listed below (see PT Problem List). Pt will benefit from skilled PT to increase their independence and safety with mobility to allow discharge to the venue listed below.          Recommendations for follow up therapy are one component of a multi-disciplinary discharge planning process, led by the attending physician.  Recommendations may be updated based on patient status, additional functional criteria and insurance authorization.  Follow Up Recommendations Skilled nursing-short term rehab (<3 hours/day)      Assistance Recommended at Discharge Frequent or constant Supervision/Assistance  Patient can return home with the following  Two people to help with walking and/or transfers;Two people to help with bathing/dressing/bathroom;Assistance with cooking/housework;Assistance with feeding;Assist for transportation;Help with stairs or ramp for entrance    Equipment Recommendations Other (comment) (TBD)  Recommendations for Other Services  OT consult    Functional Status Assessment Patient has had a recent decline in their functional status and demonstrates the ability to make significant improvements in function in a reasonable and predictable amount of time.     Precautions / Restrictions Precautions Precautions: Fall Precaution Comments: Painful R wrist      Mobility  Bed Mobility Overal bed mobility: Needs Assistance Bed Mobility: Rolling, Sidelying to Sit Rolling: Max assist Sidelying to sit: Max assist Supine to sit: Mod assist     General bed mobility comments: Rolled R and L to assist with hygeine after BM; Max assist to elevate trunk sidelying to sit; Heavy mod assist to help LEs onto bed; 2 person Total assist to scoot pt up to [facility]    Transfers Overall transfer level: Needs assistance Equipment used: 2 person hand held assist Transfers: Sit to/from Stand, Bed to chair/wheelchair/BSC Sit to Stand: +2  physical assistance, Max assist          Lateral/Scoot Transfers: Total  assist, +2 physical assistance, +2 safety/equipment General transfer comment: Stood from EOB with 2 person assist, knees blocked and bed pad used to cradle hips; Able to clear hips from EOB, but did not get to fully upright standing; stood approx 10seconds; Worked on lateral scoots at EOB; Pt had difficulty with weight shifts, which lead to difficulty unweighing hips for moving    Ambulation/Gait                  Stairs            Wheelchair Mobility    Modified Rankin (Stroke Patients Only)       Balance                                             Pertinent Vitals/Pain Pain Assessment Pain Assessment: Faces Faces Pain Scale: Hurts little more Pain Location: Generalized, and noted grimace with RUE holding/movement Pain Descriptors / Indicators: Grimacing Pain Intervention(s): Monitored during session    Home Living Family/patient expects to be discharged to:: Skilled nursing facility Living Arrangements: Spouse/significant other (grandkids) Available Help at Discharge: Family;Available 24 hours/day Type of Home: House Home Access: Stairs to enter Entrance Stairs-Rails: Right;Left;Can reach both Entrance Stairs-Number of Steps: 3   Home Layout: One level Home Equipment: Cane - single point Additional Comments: pt reports use of cane at baseline;pt lives with his "better half" and her 2 grandchildren who are 18y.o and 16y.o    Prior Function Prior Level of Function : Independent/Modified Independent;Driving             Mobility Comments: Independent and using cane for amb prior to admission in June; Reports assist for gettingup at SNF, and he has not been able to walk yet ADLs Comments: Prior to June admission,  he was on 2lnc at all times, he reports he was independent with ADL/IADL and functional mobility     Hand Dominance   Dominant Hand: Right     Extremity/Trunk Assessment   Upper Extremity Assessment Upper Extremity Assessment: Defer to OT evaluation (Painful and swollen R wrist; elevated at end of session)    Lower Extremity Assessment Lower Extremity Assessment: Generalized weakness;Difficult to assess due to impaired cognition       Communication   Communication: Receptive difficulties;Other (comment) (Difficulty staying awake)  Cognition Arousal/Alertness: Lethargic (Somnolent, eyes drifting closed consistently throughout session) Behavior During Therapy: WFL for tasks assessed/performed, Flat affect (VERY sleepy) Overall Cognitive Status: No family/caregiver present to determine baseline cognitive functioning Area of Impairment: Attention, Safety/judgement, Problem solving                             Problem Solving: Decreased initiation, Difficulty sequencing, Requires verbal cues, Requires tactile cues          General Comments General comments (skin integrity, edema, etc.): Session conducted on 3 L supplemental O2, nd O2 sats remained greater than or equal to 89%    Exercises     Assessment/Plan    PT Assessment Patient needs continued PT services  PT Problem List Decreased strength;Decreased activity tolerance;Decreased range of motion;Decreased mobility;Decreased balance;Decreased coordination;Pain       PT Treatment Interventions DME instruction;Gait training;Stair training;Functional mobility training;Therapeutic activities;Therapeutic exercise;Balance training;Neuromuscular re-education;Patient/family education;Wheelchair mobility training;Manual techniques    PT Goals (Current goals can be found in the Care Plan  section)  Acute Rehab PT Goals Patient Stated Goal: Did not specifically state PT Goal Formulation: Patient unable to participate in goal setting Time For Goal Achievement: 02/11/22 Potential to Achieve Goals: Fair    Frequency Min 3X/week     Co-evaluation                AM-PAC PT "6 Clicks" Mobility  Outcome Measure Help needed turning from your back to your side while in a flat bed without using bedrails?: A Lot Help needed moving from lying on your back to sitting on the side of a flat bed without using bedrails?: Total Help needed moving to and from a bed to a chair (including a wheelchair)?: Total Help needed standing up from a chair using your arms (e.g., wheelchair or bedside chair)?: Total Help needed to walk in hospital room?: Total Help needed climbing 3-5 steps with a railing? : Total 6 Click Score: 7    End of Session   Activity Tolerance: Other (comment) (Difficulty staying awake) Patient left: in bed;with call bell/phone within reach;with bed alarm set (bed in semi-chair position) Nurse Communication: Mobility status PT Visit Diagnosis: Other abnormalities of gait and mobility (R26.89);Muscle weakness (generalized) (M62.81) Pain - Right/Left: Right Pain - part of body:  (hand, wrist, back)    Time: 1694-5038 PT Time Calculation (min) (ACUTE ONLY): 38 min   Charges:   PT Evaluation $PT Eval Moderate Complexity: 1 Mod PT Treatments $Therapeutic Activity: 23-37 mins        Roney Marion, PT  Acute Rehabilitation Services Office [phone]   [place] 01/28/2022, 1:43 PM

## 2022-01-28 NOTE — Plan of Care (Signed)
?  Problem: Elimination: ?Goal: Will not experience complications related to bowel motility ?Outcome: Progressing ?  ?Problem: Pain Managment: ?Goal: General experience of comfort will improve ?Outcome: Progressing ?  ?Problem: Safety: ?Goal: Ability to remain free from injury will improve ?Outcome: Progressing ?  ?

## 2022-01-28 NOTE — Progress Notes (Addendum)
[name] for Infectious Disease  Date of Admission:  02/12/2022           Reason for visit: Follow up on sepsis  Current antibiotics: Vancomycin 7/6--present Cefepime 7/6--present  ASSESSMENT:    76 y.o. male admitted with:  Left lower lobe pneumonia and acute hypoxic respiratory failure: Patient has been extubated on 01/25/2022 and is currently stable on nasal cannula.  Today is day #7 of vancomycin and cefepime. Discitis/osteomyelitis L3-L4: Patient has been on antibiotics related to this since his June hospitalization with planned end date of antibiotics on 02/05/2022.  Previously on ceftaroline and then transition to ceftriaxone and doxycycline by his skilled nursing facility on approximately 01/17/2022. Vascular access: Tunneled central line was removed yesterday as a central line infection could not be definitively excluded due to patient having been on antibiotics for several weeks prior to admission. Right wrist pain: Seen by orthopedics.  Differential includes gout/pseudogout versus less likely septic arthritis.  Planning for IR aspiration for diagnostic purposes and also on colchicine. Acute kidney injury: In the setting of CKD 3.  His creatinine has been improved and remained stable today at 1.47.  RECOMMENDATIONS:    Continue vancomycin and cefepime today and then will stop Starting tomorrow, will resume ceftriaxone 2 g daily and doxycycline 100 mg twice daily Appreciate orthopedic follow-up.  We will plan for IR aspiration of the right wrist for diagnostic purposes.  Recommend sending fluid for crystals, Gram stain, cultures Lab monitoring Will follow   Principal Problem:   Septic shock (HCC) Active Problems:   Acute respiratory failure with hypoxia and hypercapnia (HCC)   COPD (chronic obstructive pulmonary disease) (HCC)   Acute kidney injury (HCC)   Discitis of lumbar region   Pressure injury of skin   HCAP (healthcare-associated pneumonia)   Right wrist  pain    MEDICATIONS:    Scheduled Meds:  amiodarone  200 mg Oral Daily   arformoterol  15 mcg Nebulization BID   budesonide (PULMICORT) nebulizer solution  0.5 mg Nebulization BID   Chlorhexidine Gluconate Cloth  6 each Topical Daily   colchicine  0.6 mg Oral BID   enoxaparin (LOVENOX) injection  90 mg Subcutaneous Q12H   potassium chloride  40 mEq Oral Daily   sodium chloride flush  10-40 mL Intracatheter Q12H   Continuous Infusions:  sodium chloride Stopped (01/23/22 1353)   sodium chloride 50 mL/hr at 01/28/22 0024   ceFEPime (MAXIPIME) IV 2 g (01/28/22 0836)   vancomycin Stopped (01/26/22 2233)   PRN Meds:.albuterol, mouth rinse, sodium chloride flush  SUBJECTIVE:   24 hour events:  Transferred to [place] Urinary retention Seen by ortho and planning still for aspiration of wrist by IR Suspect more gout or pseudogout than septic arthritis Continued on cefepime/vancomycin Labs are stable No new micro No new imaging Afebrile Stable on 3 liters via [place]   Patient reports continued right wrist pain.  He just had a bowel movement.  He has no other acute complaints.  Review of Systems  All other systems reviewed and are negative.     OBJECTIVE:   Blood pressure 117/68, pulse (!) 106, temperature 97.7 F (36.5 C), temperature source Oral, resp. rate 18, height '5\' 6"'$  (1.676 m), weight 88.4 kg, SpO2 96 %. Body mass index is 31.46 kg/m.  Physical Exam Constitutional:      General: He is not in acute distress.    Appearance: He is well-developed.  HENT:     Head:  Normocephalic and atraumatic.  Eyes:     Extraocular Movements: Extraocular movements intact.     Conjunctiva/sclera: Conjunctivae normal.  Cardiovascular:     Comments: His previous tunneled central line has been removed. Pulmonary:     Effort: Pulmonary effort is normal. No respiratory distress.     Comments: He is breathing comfortably on nasal cannula. Abdominal:     General: There is no  distension.     Palpations: Abdomen is soft.  Musculoskeletal:     Cervical back: Normal range of motion and neck supple.     Comments: His right wrist continues to have some tenderness to palpation with decreased range of motion.  There is no significant erythema or fluctuance.  Skin:    General: Skin is warm and dry.     Findings: No rash.  Neurological:     General: No focal deficit present.     Mental Status: He is alert and oriented to person, place, and time.  Psychiatric:        Mood and Affect: Mood normal.        Behavior: Behavior normal.      Lab Results: Lab Results  Component Value Date   WBC 12.0 (H) 01/28/2022   HGB 11.6 (L) 01/28/2022   HCT 37.4 (L) 01/28/2022   MCV 96.9 01/28/2022   PLT 259 01/28/2022    Lab Results  Component Value Date   NA 143 01/28/2022   K 4.0 01/28/2022   CO2 22 01/28/2022   GLUCOSE 108 (H) 01/28/2022   BUN 25 (H) 01/28/2022   CREATININE 1.47 (H) 01/28/2022   CALCIUM 7.9 (L) 01/28/2022   GFRNONAA 50 (L) 01/28/2022   GFRAA 61 07/16/2020    Lab Results  Component Value Date   ALT 91 (H) 01/24/2022   AST 96 (H) 01/24/2022   ALKPHOS 84 01/24/2022   BILITOT 1.1 01/24/2022       Component Value Date/Time   CRP 15.9 (H) 12/27/2021 1917       Component Value Date/Time   ESRSEDRATE 31 (H) 12/27/2021 1917     I have reviewed the micro and lab results in Epic.  Imaging: VAS [place] UPPER EXTREMITY VENOUS DUPLEX  Result Date: 01/27/2022 UPPER VENOUS STUDY  Patient Name:  [name] [facility]  Date of Exam:   01/26/2022 Medical Rec #: [id number]                 Accession #:    [id number] Date of Birth: April 19, 1946                Patient Gender: M Patient Age:   57 years Exam Location:  [facility] Procedure:      VAS [place] UPPER EXTREMITY VENOUS DUPLEX Referring Phys: Elisabeth Cara SOOD --------------------------------------------------------------------------------  Indications: Pain Performing Technologist: Bobetta Lime BS, RVT   Examination Guidelines: A complete evaluation includes B-mode imaging, spectral Doppler, color Doppler, and power Doppler as needed of all accessible portions of each vessel. Bilateral testing is considered an integral part of a complete examination. Limited examinations for reoccurring indications may be performed as noted.  Right Findings: +----------+------------+---------+-----------+----------+-------+ RIGHT     CompressiblePhasicitySpontaneousPropertiesSummary +----------+------------+---------+-----------+----------+-------+ IJV           Full       Yes       Yes                      +----------+------------+---------+-----------+----------+-------+ Subclavian    Full  Yes       Yes                      +----------+------------+---------+-----------+----------+-------+ Axillary      Full       Yes       Yes                      +----------+------------+---------+-----------+----------+-------+ Brachial      Full                                          +----------+------------+---------+-----------+----------+-------+ Radial        Full                                          +----------+------------+---------+-----------+----------+-------+ Ulnar         Full                                          +----------+------------+---------+-----------+----------+-------+ Cephalic      Full                                          +----------+------------+---------+-----------+----------+-------+ Basilic       Full                                          +----------+------------+---------+-----------+----------+-------+ Difficult exam due to patient's inability to move arm away from body and could not straighten from bent position.  Left Findings: +----------+------------+---------+-----------+----------+-------+ LEFT      CompressiblePhasicitySpontaneousPropertiesSummary +----------+------------+---------+-----------+----------+-------+  Subclavian    Full       Yes       Yes                      +----------+------------+---------+-----------+----------+-------+  Summary:  Right: No evidence of deep vein thrombosis in the upper extremity. No evidence of superficial vein thrombosis in the upper extremity.  Left: No evidence of thrombosis in the subclavian.  *See table(s) above for measurements and observations.  Diagnosing physician: Servando Snare MD Electronically signed by Servando Snare MD on 01/27/2022 at 2:55:01 PM.    Final    IR Removal Tun Cv Cath W/O FL  Result Date: 01/27/2022 INDICATION: Patient with a history of lumbar discitis/osteomyelitis with a tunneled central line placed by IR 01/05/22 for antibiotics. Line is no longer needed. Interventional radiology asked to remove tunneled central line. EXAM: REMOVAL TUNNELED CENTRAL VENOUS CATHETER MEDICATIONS: None ANESTHESIA/SEDATION: None FLUOROSCOPY: None COMPLICATIONS: None immediate. PROCEDURE: Verbal consent was obtained from the patient after a thorough discussion of the procedural risks, benefits and alternatives. All questions were addressed. Sterile Barrier Technique was utilized including, mask, sterile gloves, sterile drape, hand hygiene and skin antiseptic. A timeout was performed prior to the initiation of the procedure. The patient's right chest and catheter was prepped and draped in a normal sterile fashion. Using gentle manual traction the cuff of the catheter was exposed and the catheter was  removed in it's entirety. Pressure was held till hemostasis was obtained. A sterile dressing was applied. The patient tolerated the procedure well with no immediate complications. IMPRESSION: Successful catheter removal as described above. Read by: Soyla Dryer, NP Electronically Signed   By: Corrie Mckusick D.O.   On: 01/27/2022 12:44   [place] RT UPPER EXTREM LTD SOFT TISSUE NON VASCULAR  Result Date: 01/26/2022 CLINICAL DATA:  Right wrist pain EXAM: ULTRASOUND RIGHT UPPER  EXTREMITY LIMITED TECHNIQUE: Ultrasound examination of the upper extremity soft tissues was performed in the area of clinical concern. COMPARISON:  None Available. FINDINGS: Based on the cine sequences, there seems to be a tendon or similar structure with complex surrounding fluid signal intensity in the region of concern. This seems to be just superficial to a bony structure. IMPRESSION: 1. Superficial to a bony structure, there is some complex fluid and echogenicity surrounding what seems to be a tendon. Presuming this is along the first extensor compartment (the technologist reports that it is along the lateral wrist), it could represent de Quervain tenosynovitis. This could be further localized and characterized with MRI if clinically warranted. Electronically Signed   By: Van Clines M.D.   On: 01/26/2022 18:24   DG Swallowing Func-Speech Pathology  Result Date: 01/26/2022 Table formatting from the original result was not included. Images from the original result were not included. Objective Swallowing Evaluation: Type of Study: Bedside Swallow Evaluation  Patient Details Name: [name] MRN: [id number] Date of Birth: 1945-12-11 Today's Date: 01/26/2022 Time: SLP Start Time (ACUTE ONLY): 1338 -SLP Stop Time (ACUTE ONLY): 9735 SLP Time Calculation (min) (ACUTE ONLY): 10 min Past Medical History: Past Medical History: Diagnosis Date  Acute lower GI bleeding 02/15/2017  Atrial fibrillation (HCC)   Cardiomyopathy   EF55% 11/14<<35%   Cardiomyopathy (HCC)   CHF (congestive heart failure) (HCC)   COPD (chronic obstructive pulmonary disease) ([place])   Difficult airway for intubation   per telephone encounter notation  Dyspnea   Essential hypertension   GERD (gastroesophageal reflux disease)   Gout   Hepatitis   Possible history  History of atrial flutter   Ablation 2012  Hypertension   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 Ali Lowe  Severe mitral regurgitation   Substance abuse ([place])   History of alcohol; hx cocaine, heroin, crack use  Syncope   Tubular adenoma of colon 08/2014 Past Surgical History: Past Surgical History: Procedure Laterality Date  CARDIAC CATHETERIZATION    CARDIAC ELECTROPHYSIOLOGY MAPPING AND ABLATION  08/2010  [name] 09/07/2010 (12/14/2012)  CARDIAC SURGERY    CARDIOVERSION N/A 10/16/2021  Procedure: CARDIOVERSION;  Surgeon: Larey Dresser, MD;  Location: [place];  Service: Cardiovascular;  Laterality: N/A;  COLONOSCOPY    15-20 years ago had colon in [place] PROPOFOL N/A 02/12/2020  Procedure: COLONOSCOPY WITH PROPOFOL;  Surgeon: Ladene Artist, MD;  Location: [facility] ENDOSCOPY;  Service: Endoscopy;  Laterality: N/A;  CORONARY STENT INTERVENTION N/A 10/10/2020  Procedure: CORONARY STENT INTERVENTION;  Surgeon: Lorretta Harp, MD;  Location: [place] CV LAB;  Service: Cardiovascular;  Laterality: N/A;  EXCISIONAL HEMORRHOIDECTOMY  1970's  IR FLUORO GUIDE CV LINE RIGHT  01/05/2022  IR LUMBAR DISC ASPIRATION W/IMG GUIDE  12/29/2021  IR LUMBAR DISC ASPIRATION W/IMG GUIDE  12/29/2021  IR US GUIDE VASC ACCESS RIGHT  01/05/2022  LOOP RECORDER IMPLANT N/A 08/23/2013  Procedure: LOOP RECORDER IMPLANT;  Surgeon: Deboraha Sprang, MD;  Location[facility] C[facility] LAB;  Service: Cardiovascular;  Laterality: N/A;  MITRAL VALVE REPAIR N/A 11/20/2021  Procedure: MITRAL VALVE REPAIR;  Surgeon: [name], MD;  Location: [place] CV LAB;  Service: Cardiovascular;  Laterality: N/A;  MULTIPLE EXTRACTIONS WITH ALVEOLOPLASTY Bilateral 01/24/2016  Procedure: MULTIPLE EXTRACTION WITH ALVEOLOPLASTY BILATERAL;  Surgeon: Diona Browner, DDS;  Location: [place];  Service: Oral Surgery;  Laterality: Bilateral;  MULTIPLE TOOTH EXTRACTIONS  01/24/2016  MULTIPLE EXTRACTION WITH ALVEOLOPLASTY BILATERAL (Bilateral)   POLYPECTOMY  02/12/2020  Procedure: POLYPECTOMY;  Surgeon: Ladene Artist, MD;  Location: [facility] ENDOSCOPY;  Service: Endoscopy;;  RIGHT HEART CATH N/A 10/03/2021  Procedure: RIGHT HEART CATH;  Surgeon: [name], MD;  Location: [place] CV LAB;  Service: Cardiovascular;  Laterality: N/A;  RIGHT/LEFT HEART CATH AND CORONARY ANGIOGRAPHY N/A 10/09/2020  Procedure: RIGHT/LEFT HEART CATH AND CORONARY ANGIOGRAPHY;  Surgeon: Burnell Blanks, MD;  Location: [place] CV LAB;  Service: Cardiovascular;  Laterality: N/A;  TEE WITHOUT CARDIOVERSION N/A 10/16/2021  Procedure: TRANSESOPHAGEAL ECHOCARDIOGRAM (TEE);  Surgeon: Larey Dresser, MD;  Location: [facility] ENDOSCOPY;  Service: Cardiovascular;  Laterality: N/A;  TEE WITHOUT CARDIOVERSION N/A 11/07/2021  Procedure: TRANSESOPHAGEAL ECHOCARDIOGRAM (TEE);  Surgeon: Werner Lean, MD;  Location[facility] ENDOSCOPY;  Service: Cardiovascular;  Laterality: N/A;  TEE WITHOUT CARDIOVERSION N/A 11/20/2021  Procedure: TRANSESOPHAGEAL ECHOCARDIOGRAM (TEE);  Surgeon: [name], MD;  Location: [place] CV LAB;  Service: Cardiovascular;  Laterality: N/A; HPI: 76 yo male was in hospital from 6/09 to 6/19 with L3-4 discitis and osteomyelitis discharged on 6 weeks of teflaro sent from SNF with dyspnea and hypoxia with SpO2 60% on room air.  Found to have white out of Lt lung on xray.  Required intubation in ER.  Started on pressors and antibiotics.  No data recorded  Recommendations for follow up therapy are one component of a multi-disciplinary discharge planning process, led by the attending physician.  Recommendations may be updated based on patient status, additional functional criteria and insurance authorization. Assessment / Plan / Recommendation   01/26/2022   2:00 PM Clinical Impressions Clinical Impression Pt demonstrates normal swallowing without any aspiration or difficulty. Pt unable to swallow pill with liquids or puree, no esophageal sweep. RN reports  pt was able to swallow pills with water at bedside. Pt to continue a regular diet and thin liquids. Will sign off.     01/26/2022   2:00 PM Treatment Recommendations Treatment Recommendations No treatment recommended at this time      No data to display      01/26/2022   2:00 PM Diet Recommendations SLP Diet Recommendations Regular solids;Thin liquid Medication Administration Whole meds with liquid     01/26/2022   2:00 PM Other Recommendations Oral Care Recommendations Oral care BID Follow Up Recommendations No SLP follow up    No data to display        01/26/2022   2:00 PM Oral Phase Oral Phase [facility]    01/26/2022   2:00 PM Pharyngeal Phase Pharyngeal Phase WFL     No data to display    DeBlois, Katherene Ponto 01/26/2022, 2:08 PM                       Imaging independently reviewed in Epic.    Raynelle Highland for [facility] [employer] [phone]  pager 01/28/2022, 9:33 AM

## 2022-01-29 ENCOUNTER — Inpatient Hospital Stay (HOSPITAL_COMMUNITY): Payer: Medicare Other

## 2022-01-29 ENCOUNTER — Inpatient Hospital Stay (HOSPITAL_COMMUNITY): Payer: Medicare Other | Admitting: Certified Registered Nurse Anesthetist

## 2022-01-29 DIAGNOSIS — R0603 Acute respiratory distress: Secondary | ICD-10-CM

## 2022-01-29 DIAGNOSIS — R197 Diarrhea, unspecified: Secondary | ICD-10-CM

## 2022-01-29 DIAGNOSIS — Z789 Other specified health status: Secondary | ICD-10-CM

## 2022-01-29 LAB — CBC WITH DIFFERENTIAL/PLATELET
Abs Immature Granulocytes: 0.18 10*3/uL — ABNORMAL HIGH (ref 0.00–0.07)
Basophils Absolute: 0.1 10*3/uL (ref 0.0–0.1)
Basophils Relative: 0 %
Eosinophils Absolute: 0.3 10*3/uL (ref 0.0–0.5)
Eosinophils Relative: 2 %
HCT: 37.7 % — ABNORMAL LOW (ref 39.0–52.0)
Hemoglobin: 11.6 g/dL — ABNORMAL LOW (ref 13.0–17.0)
Immature Granulocytes: 1 %
Lymphocytes Relative: 6 %
Lymphs Abs: 0.8 10*3/uL (ref 0.7–4.0)
MCH: 29.6 pg (ref 26.0–34.0)
MCHC: 30.8 g/dL (ref 30.0–36.0)
MCV: 96.2 fL (ref 80.0–100.0)
Monocytes Absolute: 1.1 10*3/uL — ABNORMAL HIGH (ref 0.1–1.0)
Monocytes Relative: 8 %
Neutro Abs: 11.6 10*3/uL — ABNORMAL HIGH (ref 1.7–7.7)
Neutrophils Relative %: 83 %
Platelets: 274 10*3/uL (ref 150–400)
RBC: 3.92 MIL/uL — ABNORMAL LOW (ref 4.22–5.81)
RDW: 17.3 % — ABNORMAL HIGH (ref 11.5–15.5)
WBC: 14.1 10*3/uL — ABNORMAL HIGH (ref 4.0–10.5)
nRBC: 0 % (ref 0.0–0.2)

## 2022-01-29 LAB — BASIC METABOLIC PANEL
Anion gap: 7 (ref 5–15)
BUN: 21 mg/dL (ref 8–23)
CO2: 23 mmol/L (ref 22–32)
Calcium: 8 mg/dL — ABNORMAL LOW (ref 8.9–10.3)
Chloride: 115 mmol/L — ABNORMAL HIGH (ref 98–111)
Creatinine, Ser: 1.49 mg/dL — ABNORMAL HIGH (ref 0.61–1.24)
GFR, Estimated: 49 mL/min — ABNORMAL LOW (ref >60)
Glucose, Bld: 88 mg/dL (ref 70–99)
Potassium: 3.2 mmol/L — ABNORMAL LOW (ref 3.5–5.1)
Sodium: 145 mmol/L (ref 135–145)

## 2022-01-29 LAB — CLOSTRIDIUM DIFFICILE BY PCR, REFLEXED: Toxigenic C. Difficile by PCR: NEGATIVE

## 2022-01-29 LAB — GLUCOSE, CAPILLARY
Glucose-Capillary: 100 mg/dL — ABNORMAL HIGH (ref 70–99)
Glucose-Capillary: 144 mg/dL — ABNORMAL HIGH (ref 70–99)
Glucose-Capillary: 164 mg/dL — ABNORMAL HIGH (ref 70–99)

## 2022-01-29 MED ORDER — SODIUM CHLORIDE 0.9 % IV BOLUS
500.0000 mL | Freq: Once | INTRAVENOUS | Status: AC
  Administered 2022-01-29: 500 mL via INTRAVENOUS

## 2022-01-29 MED ORDER — APIXABAN 5 MG PO TABS
5.0000 mg | ORAL_TABLET | Freq: Two times a day (BID) | ORAL | Status: DC
  Administered 2022-01-29: 5 mg via ORAL
  Filled 2022-01-29: qty 1

## 2022-01-29 MED ORDER — INSULIN ASPART 100 UNIT/ML IJ SOLN: 0.0000 [IU] | Freq: Three times a day (TID) | INTRAMUSCULAR | Status: DC

## 2022-01-29 MED ORDER — METOPROLOL SUCCINATE ER 25 MG PO TB24
12.5000 mg | ORAL_TABLET | Freq: Every day | ORAL | Status: DC
  Administered 2022-01-29: 12.5 mg via ORAL
  Filled 2022-01-29: qty 1

## 2022-01-29 MED ORDER — POTASSIUM CHLORIDE CRYS ER 20 MEQ PO TBCR
40.0000 meq | EXTENDED_RELEASE_TABLET | Freq: Every day | ORAL | Status: DC
  Administered 2022-01-29: 40 meq via ORAL
  Filled 2022-01-29: qty 2

## 2022-01-29 MED ORDER — METOPROLOL TARTRATE 5 MG/5ML IV SOLN
5.0000 mg | Freq: Once | INTRAVENOUS | Status: AC
Start: 2022-01-29 — End: 2022-01-29
  Administered 2022-01-29: 5 mg via INTRAVENOUS
  Filled 2022-01-29: qty 5

## 2022-02-16 MED FILL — Medication: Qty: 1 | Status: AC

## 2022-02-17 NOTE — Progress Notes (Addendum)
Pt seen in room unresponsive,bp 94/57.HRin70s rapid response called, code blue called, cpr initiated  attending MD made aware. Code blue team arrrived,and subsequently ACLS  protocol initiated. Per code blue team MD,Pt expired at 1505.
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
## 2022-02-17 NOTE — Code Documentation (Signed)
  Patient Name: [name]   MRN: [id number]   Date of Birth/ Sex: 1946/01/10 , male      Admission Date: 01/21/2022  Attending Provider: Sid Falcon, MD  Primary Diagnosis: Respiratory distress [R06.03] HCAP (healthcare-associated pneumonia) [J18.9] Septic shock ([place]) [A41.9, R65.21]   Indication: Pt was in his usual state of health until this PM, when he was noted to be unresponsive, blood pressure 94/57. Code blue was subsequently called. At the time of arrival on scene, ACLS protocol was underway.   Technical Description:  - CPR performance duration:  23 minutes   - Was defibrillation or cardioversion used? No  - Was external pacer placed? No  - Was patient intubated pre/post CPR? Yes   Medications Administered: Y = Yes; Blank = No Amiodarone    Atropine    Calcium    Epinephrine  Y  Lidocaine    Magnesium    Norepinephrine    Phenylephrine    Sodium bicarbonate  Y  Vasopressin    Other    Post CPR evaluation:  - Final Status - Was patient successfully resuscitated ? No   Miscellaneous Information:  - Time of death:  44 PM  - Primary team notified?  Yes  - Family Notified? Yes  CODE BLUE for 23 minutes:  6 rounds of epi and 2 rounds of bicarb    Linus Galas, MD   February 14, 2022, 3:43 PM

## 2022-02-17 NOTE — Progress Notes (Signed)
Case reviewed with Dr. Nelia Shi.   US showed no wrist joint effusion to be aspirated.  Recommend CT or MRI of the wrist for further evaluation.   Sent secure chat to attending, asked to place CT or MRI to be done today as there will not be a [facility] radiologist tomorrow who can perform wrist aspiration.   Please call radiology for questions and concerns.   Armando Gang Micha Dosanjh PA-C 2022-02-10 8:43 AM

## 2022-02-17 NOTE — Death Summary Note (Signed)
Name: [name] MRN: [id number] DOB: 1946-03-24 76 y.o.  Date of Admission: 01/18/2022  9:13 AM Date of Discharge: 02-20-2022 Attending Physician: Sid Falcon, MD  Discharge Diagnosis: principal problem: Septic shock secondary to left lower lobe pneumonia (resolved) Acute hypoxic respiratory failure Active problems: L3/L4 discitis/osteomyelitis Right wrist pain with history of gout Loose stools/possible C. difficile infection AKI (resolved) on CKD 3 with urinary retention Paroxysmal A-fib on Eliquis Chronic combined systolic/diastolic heart failure CAD s/p PCI Valvular disease status post mitral clip Normocytic anemia Chronic pain Pressure injury bilateral heels  Cause of death: Cardiac arrest secondary to septic shock and acute hypoxic respiratory failure Time of death: 02/20/22 at 10-23-1303  Disposition and follow-up:   [name] was discharged from [facility] in expired condition.    Hospital Course: [name] is a 76 y.o. with a pertinent PMH of COPD, CAD status post LAD stent, hypertension, CKD 3, A-fib on Eliquis, 9 systolic and diastolic heart failure with severe MR status post TAVR with MitraClip, recent admission in June for L3/L4 discitis osteomyelitis who presented with dyspnea and found to be in acute hypoxic respiratory failure. On initial presentation to the ED, he was noted to have hypoxia with oxygen saturations in the 60% range on room air, blood pressure 99/62 and was in acute distress with reduced breath sounds in the left lower lung fields. Notable work-up including the following: Leukocytosis at 13.6 with chest x-ray showing left lower lobe consolidation with left pleural effusion.  He was admitted for septic shock secondary to left lower lobe pneumonia to the ICU on BiPAP and IV Levophed on July 6.  In the ICU he was quickly intubated on the night of July 6.  He was started on a 7-day course of  vancomycin and cefepime to treat his pneumonia, per ID recommendation.  The patient developed A-fib with RVR while in the ICU and he was treated with amiodarone bolus and converted to normal sinus rhythm before being transitioned to p.o. amiodarone.  He was maintained on pressors for septic shock until July 9, and then briefly restarted on pressors on July 10 for decreased blood pressures, before being weaned off once again.  He was extubated on July 9 and oxygen saturation goal was maintained on nasal cannula from that point.  Of note, he was admitted with an tunneled indwelling central venous catheter which was previously used for administration of his antibiotics for discitis/osteomyelitis after his previous admission.  His catheter was removed on July 11 by IR because of inability to rule out central line infection.  He was transitioned to the floor and managed by IM teaching service on July 12 after being off pressors for 24 hours.  Following transfer, he completed his course of vancomycin and cefepime for septic shock/pneumonia on July 12 and transitioned to IV ceftriaxone on doxycycline on February 21, 2023 to complete a course for his discitis/osteomyelitis.  Of note, he developed loose stools and was worked up for C. difficile on July 12.  Additionally, he converted back to A-fib on the night of July 12 in the morning of 2023-02-21 while still on p.o. amiodarone, and entered A-fib with RVR on the morning of 02-21-2023.  RVR was controlled with home metoprolol succinate 12.5 mg and IV metoprolol with 1 push of 5 mg at 0742 on 02/21/23.  On the morning of 2023/02/21, he was evaluated by IM teaching service, and was deemed to be stable after rate  control of his A-fib with resolution of heart rate to 70s and blood pressure in the 606T to 016W systolics (maps of 10-93).  Wheezing and increased work of breathing upon changing positions was noted, but oxygen saturation remained at 100% on nasal cannula with plan for respiratory  treatment following evaluation.  On the afternoon of 75/15/24, patient was noted to have lower blood pressures with maps in the 50s to 60s.  Patient was started on fluid resuscitation when he was noticed to be unresponsive by his nurse.  CODE BLUE was immediately called and patient endured 75 minutes of chest compressions with 6 rounds of epi and 2 rounds of bicarb without any shockable rhythm and without return of spontaneous circulation.  Patient was pronounced dead at 76 on 2023/02/01.  Family was updated and informed.  Signed: Linus Galas, MD 2022-01-31, 5:35 PM

## 2022-02-17 NOTE — Significant Event (Addendum)
Rapid Response Event Note   Reason for Call :  HR 135 bpm Pt with history of paroxysmal atrial fibrillation, on Eliquis at home. Admitted with LLL PNA, acute respiratory failure and osteomyelitis. Loose stools.   Pt currently on '200mg'$  PO Amiodarone daily.    Initial Focused Assessment:  Pt lying in bed, AO. Skin is warm, dry, pink. Positive for skin tenting. Lung sounds with faint expiratory wheeze heard throughout. He denies shortness of breath. Breathing is regular, unlabored. He denies pain. He denies feeling his heart beating fast.   VS: T 98.65F, BP 117/80, HR 138, RR 20, SpO2 100% on 3LNC CBG: 144  Interventions:  No intervention from RR RN  MD ordered: 12.5 mg PO Metoprolol and EKG  Plan of Care:  -Continue to monitor heart rate. Notify attending if HR> 100 bpm  persists and/or if patient becomes symptomatic.  Call rapid response for additional needs  Event Summary:  MD Notified: per RN Call Time: 0730 Arrival Time: [id number] End Time: [place], RN

## 2022-02-17 NOTE — Progress Notes (Addendum)
[facility]  Date of Admission:  02/10/2022           Reason for visit: Follow up on sepsis   Current antibiotics: Ceftriaxone 7/13 - present Doxycycline 7/13 - present   ASSESSMENT:    76 y.o. male admitted with:  Left lower lobe pneumonia and hypoxic respiratory failure: Patient has been extubated since 7/9 and transferred out of the ICU.  He is currently stable on 2 to 3 L via nasal cannula.  He has completed his empiric course of vancomycin and cefepime. Discitis/osteomyelitis L3-L4: Patient has been on antibiotics related to this since his June hospitalization.  Anticipated end date for treatment is 02/05/2022.  He was previously on ceftaroline and then transitioned to ceftriaxone and doxycycline by his skilled nursing facility approximately 01/17/2022. Vascular access: Tunneled central line was removed 7/11 by interventional radiology as a line infection cannot be definitively excluded in this situation due to patient having been on antibiotics for several weeks prior to admission. Right wrist pain: Seen by orthopedics.  He has symptomatically improved on empiric colchicine for gout/pseudogout.  This appears to be less likely septic arthritis.  Interventional radiology attempted a arthrocentesis this morning for diagnostic purposes, however, there was no effusion to be aspirated.   Diarrhea: Patient's initial C. difficile screen is indeterminant with a positive antigen and negative toxin.  He continues to have diarrhea and C. difficile by PCR is pending.  Other potential etiology could be medication effect from colchicine however C. difficile remains on the differential given he has been on antibiotics for several weeks at this time.  RECOMMENDATIONS:    Resume ceftriaxone and doxycycline for his vertebral infection Appreciate IR and orthopedic follow-up regarding his right wrist pain.  Based on his improvement with colchicine this does seem more consistent  with gout as opposed to septic arthritis.  CT previously ordered has been cancelled for now Await C. difficile PCR based on indeterminate screening Lab monitoring Will follow   Active Problems:   Acute respiratory failure with hypoxia and hypercapnia (HCC)   COPD (chronic obstructive pulmonary disease) (HCC)   Acute kidney injury ([place])   Discitis of lumbar region   Septic shock (HCC)   Pressure injury of skin   HAP (hospital-acquired pneumonia)   Right wrist pain    MEDICATIONS:    Scheduled Meds:  amiodarone  200 mg Oral Daily   apixaban  5 mg Oral BID   arformoterol  15 mcg Nebulization BID   budesonide (PULMICORT) nebulizer solution  0.5 mg Nebulization BID   Chlorhexidine Gluconate Cloth  6 each Topical Daily   colchicine  0.6 mg Oral BID   dapagliflozin propanediol  10 mg Oral QAC breakfast   doxycycline  100 mg Oral Q12H   metoprolol succinate  12.5 mg Oral Daily   potassium chloride  40 mEq Oral Daily   sodium chloride flush  10-40 mL Intracatheter Q12H   torsemide  20 mg Oral Daily   Continuous Infusions:  sodium chloride Stopped (01/23/22 1353)   sodium chloride 50 mL/hr at 01/28/22 2115   cefTRIAXone (ROCEPHIN)  IV     PRN Meds:.albuterol, mouth rinse, sodium chloride flush  SUBJECTIVE:   24 hour events:  Rapid response initiated this morning due to tachycardia Seen by PT who recommended SNF C. difficile testing with antigen positive toxin negative.  PCR pending 6 episodes of loose stool have been recorded BMP with hypokalemia, stable creatinine WBC relatively stable but  slightly increased Attempted IR aspiration of wrist this morning   Patient seen this morning.  He reports having tremor and diarrhea.  Reports that his wrist pain is better.  Review of Systems  All other systems reviewed and are negative.     OBJECTIVE:   Blood pressure 117/80, pulse 61, temperature 97.8 F (36.6 C), resp. rate 20, height '5\' 6"'$  (1.676 m), weight 88.4 kg, SpO2  100 %. Body mass index is 31.46 kg/m.  Physical Exam Constitutional:      Comments: Chronically ill-appearing man, lying in bed, upper extremity tremor  HENT:     Head: Normocephalic and atraumatic.  Cardiovascular:     Rate and Rhythm: Tachycardia present.  Pulmonary:     Effort: Pulmonary effort is normal. No respiratory distress.  Abdominal:     General: There is no distension.     Palpations: Abdomen is soft.  Musculoskeletal:     Cervical back: Normal range of motion and neck supple.     Comments: Right wrist not tender to palpation and better range of motion  Skin:    General: Skin is warm and dry.  Neurological:     General: No focal deficit present.     Mental Status: He is oriented to person, place, and time.  Psychiatric:        Mood and Affect: Mood normal.        Behavior: Behavior normal.      Lab Results: Lab Results  Component Value Date   WBC 14.1 (H) 02-24-2022   HGB 11.6 (L) 02/24/22   HCT 37.7 (L) 2022/02/24   MCV 96.2 02/24/2022   PLT 274 02-24-22    Lab Results  Component Value Date   NA 145 24-Feb-2022   K 3.2 (L) Feb 24, 2022   CO2 23 02-24-2022   GLUCOSE 88 2022/02/24   BUN 21 2022-02-24   CREATININE 1.49 (H) 02-24-22   CALCIUM 8.0 (L) February 24, 2022   GFRNONAA 49 (L) 02-24-2022   GFRAA 61 07/16/2020    Lab Results  Component Value Date   ALT 91 (H) 01/24/2022   AST 96 (H) 01/24/2022   ALKPHOS 84 01/24/2022   BILITOT 1.1 01/24/2022       Component Value Date/Time   CRP 15.9 (H) 12/27/2021 1917       Component Value Date/Time   ESRSEDRATE 31 (H) 12/27/2021 1917     I have reviewed the micro and lab results in Epic.  Imaging: IR Removal Tun Cv Cath W/O FL  Result Date: 01/27/2022 INDICATION: Patient with a history of lumbar discitis/osteomyelitis with a tunneled central line placed by IR 01/05/22 for antibiotics. Line is no longer needed. Interventional radiology asked to remove tunneled central line. EXAM: REMOVAL  TUNNELED CENTRAL VENOUS CATHETER MEDICATIONS: None ANESTHESIA/SEDATION: None FLUOROSCOPY: None COMPLICATIONS: None immediate. PROCEDURE: Verbal consent was obtained from the patient after a thorough discussion of the procedural risks, benefits and alternatives. All questions were addressed. Sterile Barrier Technique was utilized including, mask, sterile gloves, sterile drape, hand hygiene and skin antiseptic. A timeout was performed prior to the initiation of the procedure. The patient's right chest and catheter was prepped and draped in a normal sterile fashion. Using gentle manual traction the cuff of the catheter was exposed and the catheter was removed in it's entirety. Pressure was held till hemostasis was obtained. A sterile dressing was applied. The patient tolerated the procedure well with no immediate complications. IMPRESSION: Successful catheter removal as described above. Read by: Soyla Dryer, NP Electronically  Signed   By: Corrie Mckusick D.O.   On: 01/27/2022 12:44     Imaging independently reviewed in Epic.    [facility] [phone] pager February 08, 2022, 7:59 AM

## 2022-02-17 NOTE — Anesthesia Procedure Notes (Signed)
Procedure Name: Intubation Date/Time: 02-05-22 2:53 PM  Performed by: Alain Marion, CRNAPre-anesthesia Checklist: Patient identified, Emergency Drugs available, Suction available and Patient being monitored Patient Re-evaluated:Patient Re-evaluated prior to induction Oxygen Delivery Method: Ambu bag Preoxygenation: Pre-oxygenation with 100% oxygen Laryngoscope Size: Glidescope and 4 Grade View: Grade I Tube type: Oral Tube size: 7.5 mm Number of attempts: 1 Airway Equipment and Method: Stylet and Video-laryngoscopy Placement Confirmation: ETT inserted through vocal cords under direct vision, positive ETCO2 and breath sounds checked- equal and bilateral Secured at: 22 cm Tube secured with: Tape Dental Injury: Teeth and Oropharynx as per pre-operative assessment  Comments: Intubation performed during CPR, no issue with placement, positive capnography.

## 2022-02-17 NOTE — Progress Notes (Signed)
This Probation officer has been informed a family member is in the room.  Pt's brother seen in [place] discussed the process. Per brother, family haven't decided yet which funeral home, and will update tomorrow once known.Pt's personal belongings returned.

## 2022-02-17 NOTE — Progress Notes (Signed)
   01/31/2022 1445  Clinical Encounter Type  Visited With Patient not available  Visit Type Initial;Code;Spiritual support  Referral From Nurse  Consult/Referral To Chaplain   Chaplain responded to a code blue. Team was not able to resuscitate the patient.  No family is present. Should a chaplain be request someone will respond.   Danice Goltz [facility]  [phone]

## 2022-02-17 NOTE — Progress Notes (Signed)
Physical Therapy Treatment Patient Details Name: [name] MRN: [id number] DOB: March 31, 1946 Today's Date: 02/19/2022   History of Present Illness Pt is a 76 y/o male admitted on 7/6  with acute hypoxic respiratory failure initially started on BiPAP, subsequently intubated and extubated 7/9. Pt diagnosed with septic shock secondary to left lower lobe pneumonia. Recent admission for L3/L4 discitis/osteomyelitis on antibiotics. Painful R wrist noted and ortho planning aspiration. 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    PT Comments    Pt admitted with above diagnosis. Pt treatment limited today as pt orthostatic once at EOB.  HAd to lie pt back down. Also pt was incontinent of bowel and bladder.  Notified nursing of BPs.  Will progress pt as able.  Updated frequency to 2 x week which is appropriate freq given pts diagnosis and d/c plan.  Pt currently with functional limitations due to balance and endurance  deficits. Pt will benefit from skilled PT to increase their independence and safety with mobility to allow discharge to the venue listed below.      SpO2 >89% 3 L O2 HR 70s-100s BP pre-activity: 105/47 BP after sitting EOB and returned to supine: 82/47 BP after supine > 3 min: 96/67  Recommendations for follow up therapy are one component of a multi-disciplinary discharge planning process, led by the attending physician.  Recommendations may be updated based on patient status, additional functional criteria and insurance authorization.  Follow Up Recommendations  Skilled nursing-short term rehab (<3 hours/day)     Assistance Recommended at Discharge Frequent or constant  Supervision/Assistance  Patient can return home with the following Two people to help with walking and/or transfers;Two people to help with bathing/dressing/bathroom;Assistance with cooking/housework;Assistance with feeding;Assist for transportation;Help with stairs or ramp for entrance   Equipment Recommendations  Other (comment) (TBD)    Recommendations for Other Services OT consult     Precautions / Restrictions Precautions Precautions: Fall Precaution Comments: monitor O2, orthostatics Restrictions Weight Bearing Restrictions: No     Mobility  Bed Mobility Overal bed mobility: Needs Assistance Bed Mobility: Rolling, Sit to Supine, Sidelying to Sit Rolling: Max assist Sidelying to sit: Mod assist, HOB elevated   Sit to supine: +2 for safety/equipment, Max assist, +2 for physical assistance   General bed mobility comments: Max A for rolling side to side for hygiene bed level, Mod A to get EOB with pt assisting in bringing LEs to EOB and pushing through elbow to sit. assist to scoot hips to EOB. With dizziness sitting EOB, Max A x 2 to return to supine for safety with pt appearing diaphoretic    Transfers Overall transfer level: Needs assistance Equipment used: Ambulation equipment used Transfers: Sit to/from Stand             General transfer comment: Attempted sit to stands in [place] though pt pushing on Stedy bars rather than pulling despite cueing. pt then reporting dizziness and unable to progress standing beyond these attempts today Transfer via Lift Equipment: Stedy  Ambulation/Gait               General Gait Details: deferred   Stairs             Wheelchair Mobility    Modified Rankin (Stroke Patients Only)       Balance  Overall balance assessment: Needs assistance Sitting-balance support: No upper extremity supported, Feet supported Sitting balance-Leahy Scale: Fair Sitting balance - Comments: Sat EOB 8 min with min guard to min assist  with min assist needed due to impulsivity and pt wanting to get to 3N1 but determined unsafe to do so at this time.                                    Cognition Arousal/Alertness: Awake/alert Behavior During Therapy: Flat affect, Impulsive Overall Cognitive Status: No family/caregiver present to determine baseline cognitive functioning Area of Impairment: Attention, Safety/judgement, Problem solving, Memory, Following commands, Awareness                   Current Attention Level: Sustained Memory: Decreased short-term memory Following Commands: Follows one step commands with increased time, Follows one step commands inconsistently Safety/Judgement: Decreased awareness of safety, Decreased awareness of deficits Awareness: Emergent Problem Solving: Decreased initiation, Difficulty sequencing, Requires verbal cues, Requires tactile cues General Comments: able to report need to have BM but impulsive throughout with inconsistent following of commands - pt pushing on Stedy bars rather than pulling when cued, attempting to stand without assistance. does answer some questions appropriately but wonder if confusion vs language barrier (no indication of need for interpreter in chart)        Exercises General Exercises - Lower Extremity Ankle Circles/Pumps: AROM, Both, 10 reps, Seated Long Arc Quad: AROM, Both, 5 reps, Seated    General Comments General comments (skin integrity, edema, etc.): Pt on 3LO2.  See VS above for orthostatics      Pertinent Vitals/Pain Pain Assessment Pain Assessment: Faces Faces Pain Scale: Hurts a little bit Pain Location: generalized Pain Descriptors / Indicators: Grimacing Pain Intervention(s): Limited activity within patient's tolerance, Monitored during session, Repositioned    Home Living Family/patient expects to be discharged to:: Skilled nursing facility Living Arrangements: Spouse/significant other;Other relatives  (grandkids) Available Help at Discharge: Family;Available 24 hours/day Type of Home: House Home Access: Stairs to enter Entrance Stairs-Rails: Right;Left;Can reach both Entrance Stairs-Number of Steps: 3   Home Layout: One level Home Equipment: Cane - single point Additional Comments: pt reports use of cane at baseline;pt lives with his "better half" and her 2 grandchildren who are 18y.o and 16y.o    Prior Function            PT Goals (current goals can now be found in the care plan section) Progress towards PT goals: Progressing toward goals    Frequency    Min 2X/week      PT Plan Current plan remains appropriate;Frequency needs to be updated    Co-evaluation PT/OT/SLP Co-Evaluation/Treatment: Yes Reason for Co-Treatment: Complexity of the patient's impairments (multi-system involvement);For patient/therapist safety PT goals addressed during session: Mobility/safety with mobility OT goals addressed during session: ADL's and self-care;Strengthening/ROM      AM-PAC PT "6 Clicks" Mobility   Outcome Measure  Help needed turning from your back to your side while in a flat bed without using bedrails?: A Lot Help needed moving from lying on your back to sitting on the side of a flat bed without using bedrails?: Total Help needed moving to and from a bed to a chair (including a wheelchair)?: Total Help needed standing up from a chair using your arms (e.g., wheelchair or bedside chair)?: Total Help needed to walk in hospital room?: Total Help needed climbing 3-5 steps with  a railing? : Total 6 Click Score: 7    End of Session Equipment Utilized During Treatment: Gait belt Charlaine Dalton) Activity Tolerance: Patient limited by fatigue (dizziness) Patient left: in bed;with call bell/phone within reach;with bed alarm set Nurse Communication: Mobility status (Pt on bedpan and that PT and OT cleaned pt and changed all linens) PT Visit Diagnosis: Other abnormalities of gait and  mobility (R26.89);Muscle weakness (generalized) (M62.81) Pain - Right/Left: Right Pain - part of body:  (hand, wrist, back)     Time: 1105-1140 PT Time Calculation (min) (ACUTE ONLY): 35 min  Charges:  $Therapeutic Exercise: 8-22 mins $Therapeutic Activity: 8-22 mins                     [facility] M,PT Acute Rehab Services [place] 02/11/2022, 1:39 PM

## 2022-02-17 NOTE — Progress Notes (Signed)
Occupational Therapy Treatment Patient Details Name: [name] MRN: [id number] DOB: [birth year] Today's Date: 02-24-2022   History of present illness Pt is a 76 y/o male admitted on 7/6  with acute hypoxic respiratory failure initially started on BiPAP, subsequently intubated and extubated 7/9. Pt diagnosed with septic shock secondary to left lower lobe pneumonia. Recent admission for L3/L4 discitis/osteomyelitis on antibiotics. Painful R wrist noted and ortho planning aspiration. 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   OT comments  PTA, pt from SNF rehab with deficits in cardiopulmonary tolerance, strength, balance and cognition. Pt requires Mod-Max A x 2 for bed mobility with plans to trial Stedy today. However, pt with difficulty following commands for appropriate Stedy use, demonstrating impulsivity and reporting dizziness (+ for orthostatic BP). Pt also experienced bowel incontinence during session requiring TotalA  for cleanup bed level. Recommend DC back to SNF rehab to progress independence. Plan to further assess cognition in next session and progress OOB ADLs within pt tolerance.   SpO2 >89% 3 L O2 HR 70s-100s BP pre-activity: 105/47 BP after sitting EOB and returned to supine: 82/47 BP after supine > 3 min: 96/67   Recommendations for follow up therapy are one component of a multi-disciplinary discharge planning process, led by the attending physician.  Recommendations may be updated based on patient status, additional functional criteria and insurance authorization.    Follow Up Recommendations  Skilled nursing-short term rehab (<3 hours/day)    Assistance  Recommended at Discharge Frequent or constant Supervision/Assistance  Patient can return home with the following  Two people to help with walking and/or transfers;A lot of help with bathing/dressing/bathroom;Assistance with cooking/housework;Assistance with feeding;Help with stairs or ramp for entrance;Assist for transportation   Equipment Recommendations  Wheelchair (measurements OT);Wheelchair cushion (measurements OT);Hospital bed    Recommendations for Other Services      Precautions / Restrictions Precautions Precautions: Fall;Other (comment) Precaution Comments: monitor O2, orthostatics Restrictions Weight Bearing Restrictions: No       Mobility Bed Mobility Overal bed mobility: Needs Assistance Bed Mobility: Rolling, Sit to Supine, Sidelying to Sit Rolling: Max assist Sidelying to sit: Mod assist, HOB elevated   Sit to supine: +2 for safety/equipment, Max assist, +2 for physical assistance   General bed mobility comments: Max A for rolling side to side for hygiene bed level, Mod A to get EOB with pt assisting in bringing LEs to EOB and pushing through elbow to sit. assist to scoot hips to EOB. With dizziness sitting EOB, Max A x 2 to return to supine for safety with pt appearing diaphoretic    Transfers Overall transfer level: Needs assistance Equipment used: Ambulation equipment used Transfers: Sit to/from Stand             General transfer comment: Attempted sit to stands in [place] though pt pushing on Stedy bars rather than pulling despite cueing. pt then reporting dizziness and unable to progress standing beyond these attempts today     Balance Overall balance assessment: Needs assistance Sitting-balance support: No upper extremity supported, Feet supported Sitting balance-Leahy Scale: Fair                                     ADL either performed or  assessed with clinical judgement   ADL Overall ADL's : Needs  assistance/impaired Eating/Feeding: Set up;Bed level   Grooming: Wash/dry face;Set up   Upper Body Bathing: Moderate assistance;Sitting   Lower Body Bathing: Total assistance;Bed level   Upper Body Dressing : Moderate assistance;Sitting;Bed level   Lower Body Dressing: Total assistance;Bed level       Toileting- Clothing Manipulation and Hygiene: Total assistance;Bed level Toileting - Clothing Manipulation Details (indicate cue type and reason): experienced incontinence during session, required TotaL A for clean up bed level as unable to safely transfer to [facility] as hoped today       General ADL Comments: Cognitive deficits creating impulsivity and difficulty following directions, very poor endurance and orthostatic    Extremity/Trunk Assessment Upper Extremity Assessment Upper Extremity Assessment: Generalized weakness   Lower Extremity Assessment Lower Extremity Assessment: Defer to PT evaluation   Cervical / Trunk Assessment Cervical / Trunk Assessment: Normal    Vision Ability to See in Adequate Light: 0 Adequate Patient Visual Report: No change from baseline Vision Assessment?: No apparent visual deficits   Perception     Praxis      Cognition Arousal/Alertness: Awake/alert Behavior During Therapy: Flat affect, Impulsive Overall Cognitive Status: No family/caregiver present to determine baseline cognitive functioning Area of Impairment: Attention, Safety/judgement, Problem solving, Memory, Following commands, Awareness                   Current Attention Level: Sustained Memory: Decreased short-term memory Following Commands: Follows one step commands with increased time, Follows one step commands inconsistently Safety/Judgement: Decreased awareness of safety, Decreased awareness of deficits Awareness: Emergent Problem Solving: Decreased initiation, Difficulty sequencing, Requires verbal cues, Requires tactile cues General Comments: able to report need  to have BM but impulsive throughout with inconsistent following of commands - pt pushing on Stedy bars rather than pulling when cued, attempting to stand without assistance. does answer some questions appropriately but wonder if confusion vs language barrier (no indication of need for interpreter in chart)        Exercises      Shoulder Instructions       General Comments      Pertinent Vitals/ Pain       Pain Assessment Pain Assessment: Faces Faces Pain Scale: Hurts a little bit Pain Location: generalized Pain Descriptors / Indicators: Grimacing Pain Intervention(s): Monitored during session  Home Living Family/patient expects to be discharged to:: Skilled nursing facility Living Arrangements: Spouse/significant other;Other relatives (grandkids) Available Help at Discharge: Family;Available 24 hours/day Type of Home: House Home Access: Stairs to enter [employer] of Steps: 3 Entrance Stairs-Rails: Right;Left;Can reach both Home Layout: One level     Bathroom Shower/Tub: Teacher, early years/pre: Standard Bathroom Accessibility: Yes   Home Equipment: Cane - single point   Additional Comments: pt reports use of cane at baseline;pt lives with his "better half" and her 2 grandchildren who are 18y.o and 16y.o      Prior Functioning/Environment              Frequency  Min 2X/week        Progress Toward Goals  OT Goals(current goals can now be found in the care plan section)     Acute Rehab OT Goals Patient Stated Goal: use the bathroom OT Goal Formulation: With patient Time For Goal Achievement: 02/12/22 Potential to Achieve Goals: Good  Plan      Co-evaluation    PT/OT/SLP Co-Evaluation/Treatment: Yes Reason for Co-Treatment: For patient/therapist safety;To  address functional/ADL transfers   OT goals addressed during session: ADL's and self-care;Strengthening/ROM      AM-PAC OT "6 Clicks" Daily Activity     Outcome  Measure   Help from another person eating meals?: A Little Help from another person taking care of personal grooming?: A Little Help from another person toileting, which includes using toliet, bedpan, or urinal?: Total Help from another person bathing (including washing, rinsing, drying)?: A Lot Help from another person to put on and taking off regular upper body clothing?: A Lot Help from another person to put on and taking off regular lower body clothing?: Total 6 Click Score: 12    End of Session Equipment Utilized During Treatment: Gait belt;Oxygen  OT Visit Diagnosis: Other abnormalities of gait and mobility (R26.89);Unsteadiness on feet (R26.81);Muscle weakness (generalized) (M62.81)   Activity Tolerance Treatment limited secondary to medical complications (Comment)   Patient Left in bed;with call bell/phone within reach;with bed alarm set   Nurse Communication Mobility status;Other (comment) (BP, back on bedpan)        Time: 8502-7741 OT Time Calculation (min): 32 min  Charges: OT General Charges $OT Visit: 1 Visit OT Evaluation $OT Eval Moderate Complexity: 1 Mod  Malachy Chamber, OTR/L Acute Rehab Services Office: [phone]   Layla Maw 2022/02/19, 12:43 PM

## 2022-02-17 DEATH — deceased

## 2022-04-01 ENCOUNTER — Ambulatory Visit: Payer: Medicare Other | Admitting: Internal Medicine

## 2022-04-06 ENCOUNTER — Ambulatory Visit: Payer: Medicare Other | Admitting: Interventional Cardiology

## 2022-11-18 ENCOUNTER — Other Ambulatory Visit (HOSPITAL_COMMUNITY): Payer: Medicare Other

## 2022-11-18 ENCOUNTER — Ambulatory Visit: Payer: Medicare Other
